# Patient Record
Sex: Male | Born: 1950
Health system: Southern US, Community
[De-identification: ages and names within clinical notes are randomized; demographics above are authoritative.]

## PROBLEM LIST (undated history)

## (undated) DIAGNOSIS — Z9119 Patient's noncompliance with other medical treatment and regimen: Secondary | ICD-10-CM

## (undated) DIAGNOSIS — D696 Thrombocytopenia, unspecified: Secondary | ICD-10-CM

## (undated) DIAGNOSIS — I471 Supraventricular tachycardia: Secondary | ICD-10-CM

## (undated) DIAGNOSIS — I4721 Torsades de pointes: Secondary | ICD-10-CM

## (undated) DIAGNOSIS — N183 Chronic kidney disease, stage 3 unspecified: Secondary | ICD-10-CM

## (undated) DIAGNOSIS — G4733 Obstructive sleep apnea (adult) (pediatric): Secondary | ICD-10-CM

## (undated) DIAGNOSIS — J449 Chronic obstructive pulmonary disease, unspecified: Secondary | ICD-10-CM

## (undated) DIAGNOSIS — I739 Peripheral vascular disease, unspecified: Secondary | ICD-10-CM

## (undated) DIAGNOSIS — C189 Malignant neoplasm of colon, unspecified: Secondary | ICD-10-CM

## (undated) DIAGNOSIS — Z91199 Patient's noncompliance with other medical treatment and regimen due to unspecified reason: Secondary | ICD-10-CM

## (undated) DIAGNOSIS — I472 Ventricular tachycardia: Secondary | ICD-10-CM

## (undated) DIAGNOSIS — I712 Thoracic aortic aneurysm, without rupture, unspecified: Secondary | ICD-10-CM

## (undated) DIAGNOSIS — I5042 Chronic combined systolic (congestive) and diastolic (congestive) heart failure: Secondary | ICD-10-CM

## (undated) DIAGNOSIS — I2510 Atherosclerotic heart disease of native coronary artery without angina pectoris: Secondary | ICD-10-CM

## (undated) DIAGNOSIS — E669 Obesity, unspecified: Secondary | ICD-10-CM

## (undated) DIAGNOSIS — Z8041 Family history of malignant neoplasm of ovary: Secondary | ICD-10-CM

## (undated) DIAGNOSIS — I10 Essential (primary) hypertension: Secondary | ICD-10-CM

## (undated) DIAGNOSIS — I4729 Other ventricular tachycardia: Secondary | ICD-10-CM

## (undated) DIAGNOSIS — I4719 Other supraventricular tachycardia: Secondary | ICD-10-CM

## (undated) DIAGNOSIS — Z933 Colostomy status: Secondary | ICD-10-CM

## (undated) DIAGNOSIS — Z9989 Dependence on other enabling machines and devices: Secondary | ICD-10-CM

## (undated) DIAGNOSIS — J45909 Unspecified asthma, uncomplicated: Secondary | ICD-10-CM

## (undated) DIAGNOSIS — E7800 Pure hypercholesterolemia, unspecified: Secondary | ICD-10-CM

## (undated) DIAGNOSIS — M1990 Unspecified osteoarthritis, unspecified site: Secondary | ICD-10-CM

## (undated) HISTORY — PX: CORONARY ANGIOPLASTY WITH STENT PLACEMENT: SHX49

## (undated) HISTORY — DX: Peripheral vascular disease, unspecified: I73.9

## (undated) HISTORY — DX: Other supraventricular tachycardia: I47.19

## (undated) HISTORY — DX: Family history of malignant neoplasm of ovary: Z80.41

## (undated) HISTORY — DX: Thoracic aortic aneurysm, without rupture, unspecified: I71.20

## (undated) HISTORY — DX: Malignant neoplasm of colon, unspecified: C18.9

## (undated) HISTORY — DX: Atherosclerotic heart disease of native coronary artery without angina pectoris: I25.10

## (undated) HISTORY — DX: Obesity, unspecified: E66.9

## (undated) HISTORY — DX: Patient's noncompliance with other medical treatment and regimen due to unspecified reason: Z91.199

## (undated) HISTORY — DX: Patient's noncompliance with other medical treatment and regimen: Z91.19

## (undated) HISTORY — DX: Ventricular tachycardia: I47.2

## (undated) HISTORY — DX: Other ventricular tachycardia: I47.29

## (undated) HISTORY — DX: Supraventricular tachycardia: I47.1

## (undated) HISTORY — PX: COLON SURGERY: SHX602

---

## 2000-04-07 ENCOUNTER — Inpatient Hospital Stay (HOSPITAL_COMMUNITY): Admission: EM | Admit: 2000-04-07 | Discharge: 2000-04-11 | Payer: Self-pay | Admitting: Emergency Medicine

## 2000-04-07 ENCOUNTER — Encounter: Payer: Self-pay | Admitting: Emergency Medicine

## 2000-04-08 ENCOUNTER — Encounter: Payer: Self-pay | Admitting: Family Medicine

## 2000-04-17 ENCOUNTER — Encounter: Admission: RE | Admit: 2000-04-17 | Discharge: 2000-04-17 | Payer: Self-pay | Admitting: Family Medicine

## 2008-11-05 ENCOUNTER — Ambulatory Visit: Payer: Self-pay | Admitting: Internal Medicine

## 2008-11-05 ENCOUNTER — Inpatient Hospital Stay (HOSPITAL_COMMUNITY): Admission: EM | Admit: 2008-11-05 | Discharge: 2008-11-13 | Payer: Self-pay | Admitting: Emergency Medicine

## 2008-11-06 ENCOUNTER — Encounter: Payer: Self-pay | Admitting: Internal Medicine

## 2008-11-07 ENCOUNTER — Encounter: Payer: Self-pay | Admitting: Internal Medicine

## 2008-11-07 ENCOUNTER — Ambulatory Visit: Payer: Self-pay | Admitting: Pulmonary Disease

## 2008-11-07 ENCOUNTER — Ambulatory Visit: Payer: Self-pay | Admitting: Surgery

## 2008-11-13 ENCOUNTER — Encounter: Payer: Self-pay | Admitting: Internal Medicine

## 2008-12-02 DIAGNOSIS — I10 Essential (primary) hypertension: Secondary | ICD-10-CM | POA: Insufficient documentation

## 2008-12-03 ENCOUNTER — Ambulatory Visit: Payer: Self-pay | Admitting: Internal Medicine

## 2008-12-03 DIAGNOSIS — I2589 Other forms of chronic ischemic heart disease: Secondary | ICD-10-CM | POA: Insufficient documentation

## 2008-12-03 DIAGNOSIS — I471 Supraventricular tachycardia: Secondary | ICD-10-CM | POA: Insufficient documentation

## 2008-12-03 DIAGNOSIS — E785 Hyperlipidemia, unspecified: Secondary | ICD-10-CM | POA: Insufficient documentation

## 2008-12-03 DIAGNOSIS — J45909 Unspecified asthma, uncomplicated: Secondary | ICD-10-CM | POA: Insufficient documentation

## 2008-12-03 DIAGNOSIS — J4489 Other specified chronic obstructive pulmonary disease: Secondary | ICD-10-CM | POA: Insufficient documentation

## 2008-12-03 DIAGNOSIS — I2510 Atherosclerotic heart disease of native coronary artery without angina pectoris: Secondary | ICD-10-CM | POA: Insufficient documentation

## 2008-12-03 DIAGNOSIS — E669 Obesity, unspecified: Secondary | ICD-10-CM

## 2008-12-03 DIAGNOSIS — J449 Chronic obstructive pulmonary disease, unspecified: Secondary | ICD-10-CM | POA: Insufficient documentation

## 2008-12-04 ENCOUNTER — Ambulatory Visit: Payer: Self-pay | Admitting: Pulmonary Disease

## 2008-12-04 DIAGNOSIS — G4733 Obstructive sleep apnea (adult) (pediatric): Secondary | ICD-10-CM | POA: Insufficient documentation

## 2008-12-04 LAB — CONVERTED CEMR LAB
ALT: 17 units/L (ref 0–53)
AST: 16 units/L (ref 0–37)
Albumin: 3.9 g/dL (ref 3.5–5.2)
Alkaline Phosphatase: 103 units/L (ref 39–117)
BUN: 15 mg/dL (ref 6–23)
Bilirubin, Direct: 0.1 mg/dL (ref 0.0–0.3)
CO2: 28 meq/L (ref 19–32)
Calcium: 9.6 mg/dL (ref 8.4–10.5)
Chloride: 103 meq/L (ref 96–112)
Creatinine, Ser: 1.3 mg/dL (ref 0.4–1.5)
GFR calc non Af Amer: 73.49 mL/min (ref >60)
Glucose, Bld: 92 mg/dL (ref 70–99)
Potassium: 4 meq/L (ref 3.5–5.1)
Pro B Natriuretic peptide (BNP): 89 pg/mL (ref 0.0–100.0)
Sodium: 143 meq/L (ref 135–145)
Total Bilirubin: 1 mg/dL (ref 0.3–1.2)
Total Protein: 7.5 g/dL (ref 6.0–8.3)

## 2009-01-12 ENCOUNTER — Ambulatory Visit: Payer: Self-pay | Admitting: Internal Medicine

## 2009-01-12 DIAGNOSIS — N529 Male erectile dysfunction, unspecified: Secondary | ICD-10-CM | POA: Insufficient documentation

## 2009-03-19 ENCOUNTER — Encounter: Payer: Self-pay | Admitting: Internal Medicine

## 2009-04-08 ENCOUNTER — Ambulatory Visit: Payer: Self-pay

## 2009-04-08 ENCOUNTER — Ambulatory Visit: Payer: Self-pay | Admitting: Cardiology

## 2009-04-08 ENCOUNTER — Ambulatory Visit (HOSPITAL_COMMUNITY): Admission: RE | Admit: 2009-04-08 | Discharge: 2009-04-08 | Payer: Self-pay | Admitting: Internal Medicine

## 2009-04-08 ENCOUNTER — Ambulatory Visit: Payer: Self-pay | Admitting: Internal Medicine

## 2009-04-08 ENCOUNTER — Encounter: Payer: Self-pay | Admitting: Internal Medicine

## 2009-11-26 ENCOUNTER — Ambulatory Visit: Payer: Self-pay | Admitting: Internal Medicine

## 2009-11-30 LAB — CONVERTED CEMR LAB
ALT: 19 units/L (ref 0–53)
AST: 19 units/L (ref 0–37)
Albumin: 4.1 g/dL (ref 3.5–5.2)
Alkaline Phosphatase: 81 units/L (ref 39–117)
BUN: 17 mg/dL (ref 6–23)
Bilirubin, Direct: 0.1 mg/dL (ref 0.0–0.3)
CO2: 26 meq/L (ref 19–32)
Calcium: 9.4 mg/dL (ref 8.4–10.5)
Chloride: 103 meq/L (ref 96–112)
Cholesterol: 148 mg/dL (ref 0–200)
Creatinine, Ser: 1.2 mg/dL (ref 0.4–1.5)
GFR calc non Af Amer: 78.06 mL/min (ref >60)
Glucose, Bld: 90 mg/dL (ref 70–99)
HDL: 48.8 mg/dL (ref >39.00)
Hgb A1c MFr Bld: 6 % (ref 4.6–6.5)
LDL Cholesterol: 71 mg/dL (ref 0–99)
Potassium: 4 meq/L (ref 3.5–5.1)
Pro B Natriuretic peptide (BNP): 30.9 pg/mL (ref 0.0–100.0)
Sodium: 140 meq/L (ref 135–145)
Total Bilirubin: 0.9 mg/dL (ref 0.3–1.2)
Total CHOL/HDL Ratio: 3
Total Protein: 7.1 g/dL (ref 6.0–8.3)
Triglycerides: 141 mg/dL (ref 0.0–149.0)
VLDL: 28.2 mg/dL (ref 0.0–40.0)

## 2010-03-25 NOTE — Miscellaneous (Signed)
Summary: Orders Update  Clinical Lists Changes  Orders: Added new Referral order of Internal Medicine Referral (Internal) - Signed

## 2010-03-25 NOTE — Assessment & Plan Note (Signed)
Summary: 6 MO F/U   Visit Type:  Follow-up Primary Provider:  none   History of Present Illness: The patient presents today for routine cardiology followup. He reports doing very well since last being seen in our clinic. The patient denies symptoms of palpitations, chest pain, shortness of breath, orthopnea, PND, lower extremity edema, dizziness, presyncope, syncope, or neurologic sequela.  He has had difficulty with diet modifcation and continues to gain weight. The patient is tolerating medications without difficulties and is otherwise without complaint today.  \ Current Medications (verified): 1)  Bufferin 325 Mg Tabs (Aspirin Buf(Cacarb-Mgcarb-Mgo)) .[name] Kitchen.. 1 By Mouth Daily 2)  Carvedilol 25 Mg Tabs (Carvedilol) .... One By Mouth Bid 3)  Plavix 75 Mg Tabs (Clopidogrel Bisulfate) .[name] Kitchen.. 1 By Mouth Daily 4)  Crestor 40 Mg Tabs (Rosuvastatin Calcium) .[name] Kitchen.. 1 By Mouth Daily 5)  Diltiazem Hcl Cr 240 Mg Xr24h-Cap (Diltiazem Hcl) .[name] Kitchen.. 1 By Mouth Daily 6)  Furosemide 40 Mg Tabs (Furosemide) .[name] Kitchen.. 1 By Mouth  Two Times A Day 7)  Lisinopril 20 Mg Tabs (Lisinopril) .... Take One Tablet By Mouth Once Daily. 8)  Nitrostat 0.4 Mg Subl (Nitroglycerin) .... As Needed Chest Pain 9)  Klor-Con M20 20 Meq Cr-Tabs (Potassium Chloride Crys Cr) .[name] Kitchen.. 1 By Mouth Daily 10)  Xopenex Hfa 45 Mcg/act Aero (Levalbuterol Tartrate) .... As Needed 11)  Atrovent Hfa 17 Mcg/act Aers (Ipratropium Bromide Hfa) .... As Needed 12)  Viagra 50 Mg Tabs (Sildenafil Citrate) .... Take As Directed  Allergies (verified): No Known Drug Allergies  Past History:  Past Medical History: Reviewed history from 12/03/2008 and no changes required. C O P D (ICD-496) ASTHMA (ICD-493.90) OBESITY (ICD-278.00) CARDIOMYOPATHY, ISCHEMIC (ICD-414.8) ATRIAL TACHYCARDIA (ICD-427.89) CAD (ICD-414.00) s/p PCI RCA 9/10 HYPERLIPIDEMIA (ICD-272.4) HYPERTENSION (ICD-401.9) H/o tobacco use  Past Surgical History: Reviewed history from 12/02/2008 and  no changes required.  None.   Social History: Reviewed history from 12/04/2008 and no changes required. 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   Vital Signs:  Patient profile:   60 year old male Height:      71 inches Weight:      316 pounds BMI:     44.23 Pulse rate:   77 / minute BP sitting:   128 / 86  (left arm)  Vitals Entered By: Laurance Flatten CMA (November 26, 2009 10:56 AM)  Physical Exam  General:  Obese, NAD Head:  normocephalic and atraumatic Eyes:  PERRLA/EOM intact; conjunctiva and lids normal. Mouth:  Teeth, gums and palate normal. Oral mucosa normal. Neck:  supple, JVP 9 Lungs:  Clear bilaterally to auscultation and percussion. Heart:  RRR with frequent ectopy, 1/6 SEM LUSB Abdomen:  Bowel sounds positive; abdomen soft and non-tender without masses, organomegaly, or hernias noted. No hepatosplenomegaly. Msk:  Back normal, normal gait. Muscle strength and tone normal. Pulses:  pulses normal in all 4 extremities Extremities:  No clubbing or cyanosis.  1+R>L LE edema Neurologic:  Alert and oriented x 3.   EKG  Procedure date:  11/26/2009  Findings:      sinus rhythmm 77 bpm, PR 198, QTc 450, otherwise normal ekg  Impression & Recommendations:  Problem # 1:  CARDIOMYOPATHY, ISCHEMIC (ICD-414.8) stable without symptoms of CHF. continue current medicine regimen salt restriction advised bmet and BNP today  Problem # 2:  CAD (ICD-414.00) no symptoms of ischemia continue  current medicine  Problem # 3:  HYPERLIPIDEMIA (ICD-272.4) fasting lipids and LFTs  Problem # 4:  OBESITY (ICD-278.00) weight reduction advised we will check A1C today  I will refer pt to primary care.  Problem # 5:  ATRIAL TACHYCARDIA (ICD-427.89) no further episodes continue coreg and cardizem  Other Orders: EKG w/ Interpretation (93000) TLB-BMP (Basic Metabolic  Panel-BMET) (80048-METABOL) TLB-Lipid Panel (80061-LIPID) TLB-Hepatic/Liver Function Pnl (80076-HEPATIC) TLB-BNP (B-Natriuretic Peptide) (83880-BNPR) TLB-A1C / Hgb A1C (Glycohemoglobin) (83036-A1C)  Patient Instructions: 1)  Your physician recommends that you return for lab work today 2)  Your physician wants you to follow-up in:6 months with Dr Johney Frame   Bonita Quin will receive a reminder letter in the mail two months in advance. If you don't receive a letter, please call our office to schedule the follow-up appointment. 3)  You have been referred to Dr Waynard Edwards with [facility]

## 2010-03-25 NOTE — Assessment & Plan Note (Signed)
Summary: per check out/also having echo @ 2:00/saf   Visit Type:  Follow-up Primary Provider:  none  CC:  no cardiac complaints today.  History of Present Illness: The patient presents today for routine electrophysiology followup. He reports doing very well since last being seen in our clinic. The patient denies symptoms of palpitations, chest pain, shortness of breath, orthopnea, PND, lower extremity edema, dizziness, presyncope, syncope, or neurologic sequela. The patient is tolerating medications without difficulties and is otherwise without complaint today.   Current Medications (verified): 1)  Bufferin 325 Mg Tabs (Aspirin Buf(Cacarb-Mgcarb-Mgo)) .[name] Kitchen.. 1 By Mouth Daily 2)  Carvedilol 25 Mg Tabs (Carvedilol) .... One By Mouth Bid 3)  Plavix 75 Mg Tabs (Clopidogrel Bisulfate) .[name] Kitchen.. 1 By Mouth Daily 4)  Crestor 40 Mg Tabs (Rosuvastatin Calcium) .[name] Kitchen.. 1 By Mouth Daily 5)  Diltiazem Hcl Cr 240 Mg Xr24h-Cap (Diltiazem Hcl) .[name] Kitchen.. 1 By Mouth Daily 6)  Furosemide 40 Mg Tabs (Furosemide) .[name] Kitchen.. 1 By Mouth  Two Times A Day 7)  Lisinopril 20 Mg Tabs (Lisinopril) .... Take One Tablet By Mouth Once Daily. 8)  Nitrostat 0.4 Mg Subl (Nitroglycerin) .... As Needed Chest Pain 9)  Klor-Con M20 20 Meq Cr-Tabs (Potassium Chloride Crys Cr) .[name] Kitchen.. 1 By Mouth Daily 10)  Xopenex Hfa 45 Mcg/act Aero (Levalbuterol Tartrate) .... As Needed 11)  Atrovent Hfa 17 Mcg/act Aers (Ipratropium Bromide Hfa) .... As Needed 12)  Viagra 50 Mg Tabs (Sildenafil Citrate) .... Take As Directed  Allergies (verified): No Known Drug Allergies  Past History:  Past Medical History: Reviewed history from 12/03/2008 and no changes required. C O P D (ICD-496) ASTHMA (ICD-493.90) OBESITY (ICD-278.00) CARDIOMYOPATHY, ISCHEMIC (ICD-414.8) ATRIAL TACHYCARDIA (ICD-427.89) CAD (ICD-414.00) s/p PCI RCA 9/10 HYPERLIPIDEMIA (ICD-272.4) HYPERTENSION (ICD-401.9) H/o tobacco use  Past Surgical History: Reviewed history from 12/02/2008 and  no changes required.  None.   Social History: Reviewed history from 12/04/2008 and no changes required. 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 Nose:  no deformity, discharge, inflammation, or lesions Mouth:  Teeth, gums and palate normal. Oral mucosa normal. Neck:  supple, JVP 9 Lungs:  Clear bilaterally to auscultation and percussion. Heart:  RRR with frequent ectopy, 1/6 SEM LUSB Abdomen:  Bowel sounds positive; abdomen soft and non-tender without masses, organomegaly, or hernias noted. No hepatosplenomegaly. Msk:  Back normal, normal gait. Muscle strength and tone normal. Pulses:  pulses normal in all 4 extremities Extremities:  No clubbing or cyanosis.  1+R>L LE edema Neurologic:  Alert and oriented x 3. Skin:  Intact without lesions or rashes. Cervical Nodes:  no significant adenopathy Psych:  Normal affect.   EKG  Procedure date:  04/08/2009  Findings:      sinus with PACs,  80 bpm, nonspecific St/T changes  Impression & Recommendations:  Problem # 1:  CARDIOMYOPATHY, ISCHEMIC (ICD-414.8) stable without symptoms of CHF or ischemia. Continue current medical therapy salt restriction  His updated medication list for this problem includes:  Bufferin 325 Mg Tabs (Aspirin buf(cacarb-mgcarb-mgo)) .[name] Kitchen... 1 by mouth daily    Carvedilol 25 Mg Tabs (Carvedilol) ..... One by mouth bid    Plavix 75 Mg Tabs (Clopidogrel bisulfate) .[name] Kitchen... 1 by mouth daily    Diltiazem Hcl Cr 240 Mg Xr24h-cap (Diltiazem hcl) .[name] Kitchen... 1 by mouth daily    Furosemide 40 Mg  Tabs (Furosemide) .[name] Kitchen... 1 by mouth  two times a day    Lisinopril 20 Mg Tabs (Lisinopril) .[name] Kitchen... Take one tablet by mouth once daily.    Nitrostat 0.4 Mg Subl (Nitroglycerin) .[name] Kitchen... As needed chest pain  Problem # 2:  ATRIAL TACHYCARDIA (ICD-427.89) no further symptomatic episodes continue coreg  Problem # 3:  HYPERTENSION (ICD-401.9) stable  His updated medication list for this problem includes:    Bufferin 325 Mg Tabs (Aspirin buf(cacarb-mgcarb-mgo)) .[name] Kitchen... 1 by mouth daily    Carvedilol 25 Mg Tabs (Carvedilol) ..... One by mouth bid    Diltiazem Hcl Cr 240 Mg Xr24h-cap (Diltiazem hcl) .[name] Kitchen... 1 by mouth daily    Furosemide 40 Mg Tabs (Furosemide) .[name] Kitchen... 1 by mouth  two times a day    Lisinopril 20 Mg Tabs (Lisinopril) .[name] Kitchen... Take one tablet by mouth once daily.  Problem # 4:  HYPERLIPIDEMIA (ICD-272.4) stable  His updated medication list for this problem includes:    Crestor 40 Mg Tabs (Rosuvastatin calcium) .[name] Kitchen... 1 by mouth daily  CHF Assessment/Plan:      The patient's current weight is 311 pounds.  His previous weight was 304 pounds.     Patient Instructions: 1)  return in 6 months

## 2010-03-25 NOTE — Miscellaneous (Signed)
Summary: [facility] Progress Note  [facility] Progress Note   Imported By: Roderic Ovens 03/25/2009 14:53:25  _____________________________________________________________________  External Attachment:    Type:   Image     Comment:   External Document

## 2010-05-28 LAB — COMPREHENSIVE METABOLIC PANEL
AST: 19 U/L (ref 0–37)
AST: 19 U/L (ref 0–37)
Albumin: 3.4 g/dL — ABNORMAL LOW (ref 3.5–5.2)
Albumin: 4 g/dL (ref 3.5–5.2)
Alkaline Phosphatase: 86 U/L (ref 39–117)
BUN: 15 mg/dL (ref 6–23)
Calcium: 9.5 mg/dL (ref 8.4–10.5)
Creatinine, Ser: 1.03 mg/dL (ref 0.4–1.5)
GFR calc Af Amer: >60 mL/min (ref >60)
GFR calc Af Amer: >60 mL/min (ref >60)
Potassium: 3.7 mEq/L (ref 3.5–5.1)
Sodium: 138 mEq/L (ref 135–145)
Total Protein: 6.7 g/dL (ref 6.0–8.3)
Total Protein: 7.5 g/dL (ref 6.0–8.3)

## 2010-05-28 LAB — POCT I-STAT 3, VENOUS BLOOD GAS (G3P V)
Acid-Base Excess: 2 mmol/L (ref 0.0–2.0)
O2 Saturation: 72 %

## 2010-05-28 LAB — URINALYSIS, ROUTINE W REFLEX MICROSCOPIC
Bilirubin Urine: NEGATIVE
Ketones, ur: NEGATIVE mg/dL
Nitrite: NEGATIVE
pH: 6 (ref 5.0–8.0)

## 2010-05-28 LAB — BASIC METABOLIC PANEL
BUN: 11 mg/dL (ref 6–23)
BUN: 12 mg/dL (ref 6–23)
BUN: 15 mg/dL (ref 6–23)
CO2: 24 mEq/L (ref 19–32)
CO2: 26 mEq/L (ref 19–32)
CO2: 27 mEq/L (ref 19–32)
CO2: 28 mEq/L (ref 19–32)
CO2: 28 mEq/L (ref 19–32)
Calcium: 9.3 mg/dL (ref 8.4–10.5)
Calcium: 9.6 mg/dL (ref 8.4–10.5)
Chloride: 101 mEq/L (ref 96–112)
Chloride: 102 mEq/L (ref 96–112)
Chloride: 103 mEq/L (ref 96–112)
Chloride: 107 mEq/L (ref 96–112)
Creatinine, Ser: 1.15 mg/dL (ref 0.4–1.5)
Creatinine, Ser: 1.16 mg/dL (ref 0.4–1.5)
Creatinine, Ser: 1.16 mg/dL (ref 0.4–1.5)
GFR calc Af Amer: >60 mL/min (ref >60)
GFR calc Af Amer: >60 mL/min (ref >60)
GFR calc Af Amer: >60 mL/min (ref >60)
GFR calc non Af Amer: 59 mL/min — ABNORMAL LOW (ref >60)
GFR calc non Af Amer: >60 mL/min (ref >60)
Glucose, Bld: 101 mg/dL — ABNORMAL HIGH (ref 70–99)
Glucose, Bld: 87 mg/dL (ref 70–99)
Glucose, Bld: 94 mg/dL (ref 70–99)
Glucose, Bld: 98 mg/dL (ref 70–99)
Potassium: 3.2 mEq/L — ABNORMAL LOW (ref 3.5–5.1)
Potassium: 3.3 mEq/L — ABNORMAL LOW (ref 3.5–5.1)
Potassium: 3.6 mEq/L (ref 3.5–5.1)
Potassium: 3.9 mEq/L (ref 3.5–5.1)
Sodium: 136 mEq/L (ref 135–145)
Sodium: 139 mEq/L (ref 135–145)
Sodium: 142 mEq/L (ref 135–145)

## 2010-05-28 LAB — PROTIME-INR: INR: 1 (ref 0.00–1.49)

## 2010-05-28 LAB — DIFFERENTIAL
Eosinophils Relative: 1 % (ref 0–5)
Lymphocytes Relative: 20 % (ref 12–46)
Lymphs Abs: 1.6 10*3/uL (ref 0.7–4.0)
Monocytes Absolute: 0.6 10*3/uL (ref 0.1–1.0)
Monocytes Relative: 7 % (ref 3–12)

## 2010-05-28 LAB — CBC
HCT: 39.9 % (ref 39.0–52.0)
HCT: 40.5 % (ref 39.0–52.0)
Hemoglobin: 12.9 g/dL — ABNORMAL LOW (ref 13.0–17.0)
Hemoglobin: 13.2 g/dL (ref 13.0–17.0)
Hemoglobin: 13.4 g/dL (ref 13.0–17.0)
MCHC: 32.9 g/dL (ref 30.0–36.0)
MCHC: 33 g/dL (ref 30.0–36.0)
MCHC: 33.3 g/dL (ref 30.0–36.0)
MCV: 88.4 fL (ref 78.0–100.0)
MCV: 88.4 fL (ref 78.0–100.0)
MCV: 88.6 fL (ref 78.0–100.0)
Platelets: 142 10*3/uL — ABNORMAL LOW (ref 150–400)
Platelets: 158 10*3/uL (ref 150–400)
RBC: 4.43 MIL/uL (ref 4.22–5.81)
RBC: 4.58 MIL/uL (ref 4.22–5.81)
RDW: 14.8 % (ref 11.5–15.5)
RDW: 15.2 % (ref 11.5–15.5)
RDW: 15.5 % (ref 11.5–15.5)
WBC: 4.5 10*3/uL (ref 4.0–10.5)
WBC: 7.9 10*3/uL (ref 4.0–10.5)

## 2010-05-28 LAB — CARDIAC PANEL(CRET KIN+CKTOT+MB+TROPI)
Relative Index: 1 (ref 0.0–2.5)
Total CK: 216 U/L (ref 7–232)
Total CK: 232 U/L (ref 7–232)
Troponin I: 0.03 ng/mL (ref 0.00–0.06)

## 2010-05-28 LAB — POCT CARDIAC MARKERS
CKMB, poc: 4.1 ng/mL (ref 1.0–8.0)
Troponin i, poc: <0.05 ng/mL (ref 0.00–0.09)

## 2010-05-28 LAB — POCT I-STAT 3, ART BLOOD GAS (G3+)
Acid-Base Excess: 4 mmol/L — ABNORMAL HIGH (ref 0.0–2.0)
Bicarbonate: 29 mEq/L — ABNORMAL HIGH (ref 20.0–24.0)
O2 Saturation: 97 %
pO2, Arterial: 94 mmHg (ref 80.0–100.0)

## 2010-05-28 LAB — LIPID PANEL
Cholesterol: 226 mg/dL — ABNORMAL HIGH (ref 0–200)
LDL Cholesterol: 157 mg/dL — ABNORMAL HIGH (ref 0–99)
Total CHOL/HDL Ratio: 5.1 RATIO

## 2010-05-28 LAB — LIPASE, BLOOD: Lipase: 15 U/L (ref 11–59)

## 2010-05-28 LAB — URINE CULTURE: Colony Count: NO GROWTH

## 2010-05-28 LAB — CK TOTAL AND CKMB (NOT AT ARMC)
CK, MB: 3.7 ng/mL (ref 0.3–4.0)
Total CK: 283 U/L — ABNORMAL HIGH (ref 7–232)

## 2010-05-28 LAB — BRAIN NATRIURETIC PEPTIDE: Pro B Natriuretic peptide (BNP): 472 pg/mL — ABNORMAL HIGH (ref 0.0–100.0)

## 2010-05-28 LAB — APTT: aPTT: 29 seconds (ref 24–37)

## 2010-06-01 ENCOUNTER — Other Ambulatory Visit: Payer: Self-pay | Admitting: Internal Medicine

## 2010-07-06 ENCOUNTER — Other Ambulatory Visit: Payer: Self-pay | Admitting: Internal Medicine

## 2010-07-09 NOTE — Discharge Summary (Signed)
[place]  Patient:    Larry [name], Larry [name]                  MRN: [id number] Adm. Date:  [id number] Disc. Date: [id number] Attending:  Doneta Public Dictator:   Andrey Spearman, M.D.                           Discharge Summary  DISCHARGE DIAGNOSES: 1. Possible pneumonia. 2. Asthma exacerbation. 3. Possible chronic obstructive pulmonary disease. 4. Tobacco abuse. 5. Hypertension. 6. Obesity.  DISCHARGE MEDICATIONS: 1. Flovent 110 mcg 2 puffs b.i.d. 2. Albuterol 2.5 mg 2 puffs q.4h. p.r.n. 3. Nicotine patch q.d. 4. Prednisone taper; 80 mg 1st day, then 60 mg, then 40 mg, then 20 mg, then    off. 5. HCTZ 25 mg q.d.  BRIEF ADMISSION HISTORY OF PRESENT ILLNESS: This patient is a 60 year old African-American man who was admitted with increasing shortness of breath x 1 day.  His wife had been giving him albuterol neb q.3h. for one day without much relief.  He has a history of wheezing and asthma as a child with frequent hospitalizations but he has never been intubated.  Over the past year, he has noticed increase wheezing with response to albuterol MDI which he uses 1-2 times per week.  He also came in with recent fevers, chills, cough productive of clear white sputum.  PHYSICAL EXAMINATION:  VITAL SIGNS:  Significant for a temperature of 103.0, pulse 126, blood pressure 140/90, saturating 90% on room air.  GENERAL:  He was in moderate respiratory distress and unable to complete sentences and was sweating.  HEART:  Significant only for tachycardia.  LUNG:  Diffuse expiratory wheezing.  He was tachypneic.  He was unable to complete full sentences and he had increased work of breathing.  Decreased breath sounds in the left lower lobe.  LABORATORY AND ACCESSORY DATA:  Significant for an H&H which was within normal limits, normal white count.  Chest x-ray showed bronchitis and scoliosis, and he was admitted for pneumonia and asthma  exacerbation.  HOSPITAL COURSE: #1 -  PNEUMONIA:  Despite a negative chest x-ray, patient came in with fevers and decreased breath sounds in his left lower lobe.  There was never a definite pneumonia on chest x-ray but he was treated with a five day course of azithromycin and Rocephin.  On discharge, he was afebrile.  Of note, his blood cultures and sputum cultures were negative throughout the admission.  #2 - ASTHMA EXACERBATION WITH POSSIBLE CONCURRENT COPD:  With patients long history of COPD, we are unclear as to whether he has merely just asthma or also underlying COPD.  He was started on albuterol and Atrovent nebs, started on Solu-Medrol 80 mg q.6h. and given oxygen by nasal cannula as needed.  On hospital day #2, patient was feeling somewhat better, nebulizers were spaced some, he was changed to 125 mg of Solu-Medrol a day, continued on nebulizers again until April 11, 1999, at that time he was started on inhalers, changed to oral steroids at 80 a day, and patient continued to improve.  On discharge, he was ambulating in setting 97% on room air while ambulating.  He still had expiratory wheezes and some rhonchi on his exam, however, given his satisfactory O2 saturations, we thought he would be stable for discharge home. He will be going home with the medications as outlined above.  He was instructed that the Flovent  inhaler he should use every day, and the albuterol inhaler only as needed, although probably over the next couple of days he will need the albuterol inhaler more.  He will do the prednisone taper as described above and will be following up in my clinic on April 17, 2000.  #3 - TOBACCO ABUSE:  Discussed smoking cessation with the patient in the hospital and started him on the nicotine patch.  He expressed interest in continued smoking cessation so he was discharged home with a nicotine patch.  #4 - HYPERTENSION:  Patient was unclear what blood pressure medicine  he was on when he came in, however, we started him on HCTZ in the hospital.  His blood pressures trended on the high side throughout his hospital admission.  This will need to be addressed as an outpatient. DD:  04/11/00 TD:  04/12/00 Job: [id number]

## 2010-08-24 ENCOUNTER — Other Ambulatory Visit: Payer: Self-pay | Admitting: Internal Medicine

## 2010-09-08 ENCOUNTER — Other Ambulatory Visit: Payer: Self-pay | Admitting: Nurse Practitioner

## 2010-10-04 ENCOUNTER — Other Ambulatory Visit: Payer: Self-pay | Admitting: Nurse Practitioner

## 2010-10-06 ENCOUNTER — Other Ambulatory Visit: Payer: Self-pay | Admitting: Nurse Practitioner

## 2010-11-03 ENCOUNTER — Other Ambulatory Visit: Payer: Self-pay | Admitting: Internal Medicine

## 2011-04-11 ENCOUNTER — Other Ambulatory Visit: Payer: Self-pay | Admitting: *Deleted

## 2011-06-16 ENCOUNTER — Emergency Department (HOSPITAL_COMMUNITY): Payer: Federal, State, Local not specified - PPO

## 2011-06-16 ENCOUNTER — Other Ambulatory Visit: Payer: Self-pay

## 2011-06-16 ENCOUNTER — Encounter (HOSPITAL_COMMUNITY): Payer: Self-pay | Admitting: *Deleted

## 2011-06-16 ENCOUNTER — Inpatient Hospital Stay (HOSPITAL_COMMUNITY)
Admission: EM | Admit: 2011-06-16 | Discharge: 2011-06-19 | DRG: 138 | Disposition: A | Discharge status: Home / Self Care | Payer: Federal, State, Local not specified - PPO | Attending: Internal Medicine | Admitting: Internal Medicine

## 2011-06-16 DIAGNOSIS — J45909 Unspecified asthma, uncomplicated: Secondary | ICD-10-CM

## 2011-06-16 DIAGNOSIS — I129 Hypertensive chronic kidney disease with stage 1 through stage 4 chronic kidney disease, or unspecified chronic kidney disease: Secondary | ICD-10-CM | POA: Diagnosis present

## 2011-06-16 DIAGNOSIS — Z6841 Body Mass Index (BMI) 40.0 and over, adult: Secondary | ICD-10-CM

## 2011-06-16 DIAGNOSIS — R609 Edema, unspecified: Secondary | ICD-10-CM

## 2011-06-16 DIAGNOSIS — N189 Chronic kidney disease, unspecified: Secondary | ICD-10-CM | POA: Diagnosis present

## 2011-06-16 DIAGNOSIS — Z7902 Long term (current) use of antithrombotics/antiplatelets: Secondary | ICD-10-CM

## 2011-06-16 DIAGNOSIS — I2589 Other forms of chronic ischemic heart disease: Secondary | ICD-10-CM | POA: Diagnosis present

## 2011-06-16 DIAGNOSIS — I498 Other specified cardiac arrhythmias: Principal | ICD-10-CM | POA: Diagnosis present

## 2011-06-16 DIAGNOSIS — Z9119 Patient's noncompliance with other medical treatment and regimen: Secondary | ICD-10-CM

## 2011-06-16 DIAGNOSIS — J4489 Other specified chronic obstructive pulmonary disease: Secondary | ICD-10-CM | POA: Diagnosis present

## 2011-06-16 DIAGNOSIS — E8779 Other fluid overload: Secondary | ICD-10-CM | POA: Diagnosis present

## 2011-06-16 DIAGNOSIS — I2510 Atherosclerotic heart disease of native coronary artery without angina pectoris: Secondary | ICD-10-CM | POA: Diagnosis present

## 2011-06-16 DIAGNOSIS — G4730 Sleep apnea, unspecified: Secondary | ICD-10-CM

## 2011-06-16 DIAGNOSIS — E669 Obesity, unspecified: Secondary | ICD-10-CM | POA: Diagnosis present

## 2011-06-16 DIAGNOSIS — E7800 Pure hypercholesterolemia, unspecified: Secondary | ICD-10-CM | POA: Diagnosis present

## 2011-06-16 DIAGNOSIS — R000 Tachycardia, unspecified: Secondary | ICD-10-CM | POA: Diagnosis present

## 2011-06-16 DIAGNOSIS — Z7982 Long term (current) use of aspirin: Secondary | ICD-10-CM

## 2011-06-16 DIAGNOSIS — Z91199 Patient's noncompliance with other medical treatment and regimen due to unspecified reason: Secondary | ICD-10-CM

## 2011-06-16 DIAGNOSIS — I10 Essential (primary) hypertension: Secondary | ICD-10-CM | POA: Diagnosis present

## 2011-06-16 DIAGNOSIS — L97809 Non-pressure chronic ulcer of other part of unspecified lower leg with unspecified severity: Secondary | ICD-10-CM | POA: Diagnosis present

## 2011-06-16 DIAGNOSIS — J449 Chronic obstructive pulmonary disease, unspecified: Secondary | ICD-10-CM | POA: Diagnosis present

## 2011-06-16 HISTORY — DX: Essential (primary) hypertension: I10

## 2011-06-16 HISTORY — DX: Pure hypercholesterolemia, unspecified: E78.00

## 2011-06-16 HISTORY — DX: Atherosclerotic heart disease of native coronary artery without angina pectoris: I25.10

## 2011-06-16 HISTORY — DX: Chronic obstructive pulmonary disease, unspecified: J44.9

## 2011-06-16 LAB — DIFFERENTIAL
Eosinophils Absolute: 0.1 10*3/uL (ref 0.0–0.7)
Eosinophils Relative: 2 % (ref 0–5)
Lymphs Abs: 1.3 10*3/uL (ref 0.7–4.0)
Monocytes Relative: 9 % (ref 3–12)

## 2011-06-16 LAB — CBC
MCH: 27.1 pg (ref 26.0–34.0)
MCV: 84.3 fL (ref 78.0–100.0)
Platelets: 229 10*3/uL (ref 150–400)
RBC: 4.83 MIL/uL (ref 4.22–5.81)

## 2011-06-16 LAB — COMPREHENSIVE METABOLIC PANEL
ALT: 10 U/L (ref 0–53)
AST: 12 U/L (ref 0–37)
Calcium: 9.5 mg/dL (ref 8.4–10.5)
Sodium: 137 mEq/L (ref 135–145)
Total Protein: 7.4 g/dL (ref 6.0–8.3)

## 2011-06-16 LAB — PRO B NATRIURETIC PEPTIDE: Pro B Natriuretic peptide (BNP): 60.2 pg/mL (ref 0–125)

## 2011-06-16 LAB — URINALYSIS, ROUTINE W REFLEX MICROSCOPIC
Glucose, UA: NEGATIVE mg/dL
Hgb urine dipstick: NEGATIVE
Ketones, ur: NEGATIVE mg/dL
Protein, ur: NEGATIVE mg/dL

## 2011-06-16 LAB — CARDIAC PANEL(CRET KIN+CKTOT+MB+TROPI)
CK, MB: 2.9 ng/mL (ref 0.3–4.0)
Relative Index: 1.5 (ref 0.0–2.5)
Troponin I: <0.3 ng/mL (ref <0.30)

## 2011-06-16 MED ORDER — ASPIRIN 325 MG PO TABS
325.0000 mg | ORAL_TABLET | Freq: Every day | ORAL | Status: DC
  Administered 2011-06-16 – 2011-06-19 (×4): 325 mg via ORAL
  Filled 2011-06-16 (×4): qty 1

## 2011-06-16 MED ORDER — ONDANSETRON HCL 4 MG PO TABS: 4.0000 mg | ORAL_TABLET | Freq: Four times a day (QID) | ORAL | Status: DC | PRN

## 2011-06-16 MED ORDER — DILTIAZEM HCL ER COATED BEADS 240 MG PO CP24
240.0000 mg | ORAL_CAPSULE | Freq: Once | ORAL | Status: AC
  Administered 2011-06-16: 240 mg via ORAL
  Filled 2011-06-16: qty 1

## 2011-06-16 MED ORDER — CLOPIDOGREL BISULFATE 75 MG PO TABS
75.0000 mg | ORAL_TABLET | Freq: Every day | ORAL | Status: DC
  Administered 2011-06-17 – 2011-06-19 (×3): 75 mg via ORAL
  Filled 2011-06-16 (×6): qty 1

## 2011-06-16 MED ORDER — CARVEDILOL 25 MG PO TABS
25.0000 mg | ORAL_TABLET | Freq: Two times a day (BID) | ORAL | Status: DC
  Administered 2011-06-16 – 2011-06-19 (×6): 25 mg via ORAL
  Filled 2011-06-16 (×8): qty 1

## 2011-06-16 MED ORDER — ACETAMINOPHEN 325 MG PO TABS: 650.0000 mg | ORAL_TABLET | Freq: Four times a day (QID) | ORAL | Status: DC | PRN

## 2011-06-16 MED ORDER — ATORVASTATIN CALCIUM 80 MG PO TABS
80.0000 mg | ORAL_TABLET | Freq: Every day | ORAL | Status: DC
  Administered 2011-06-16 – 2011-06-18 (×3): 80 mg via ORAL
  Filled 2011-06-16 (×4): qty 1

## 2011-06-16 MED ORDER — ENOXAPARIN SODIUM 40 MG/0.4ML ~~LOC~~ SOLN
40.0000 mg | SUBCUTANEOUS | Status: DC
  Administered 2011-06-16 – 2011-06-18 (×3): 40 mg via SUBCUTANEOUS
  Filled 2011-06-16 (×4): qty 0.4

## 2011-06-16 MED ORDER — DILTIAZEM HCL ER COATED BEADS 240 MG PO CP24
240.0000 mg | ORAL_CAPSULE | Freq: Every day | ORAL | Status: DC
  Administered 2011-06-17 – 2011-06-19 (×3): 240 mg via ORAL
  Filled 2011-06-16 (×3): qty 1

## 2011-06-16 MED ORDER — FUROSEMIDE 10 MG/ML IJ SOLN
40.0000 mg | Freq: Two times a day (BID) | INTRAMUSCULAR | Status: DC
  Administered 2011-06-16: 40 mg via INTRAVENOUS
  Filled 2011-06-16 (×3): qty 4

## 2011-06-16 MED ORDER — METOPROLOL TARTRATE 1 MG/ML IV SOLN: 5.0000 mg | Freq: Four times a day (QID) | INTRAVENOUS | Status: DC | PRN

## 2011-06-16 MED ORDER — LISINOPRIL 20 MG PO TABS
20.0000 mg | ORAL_TABLET | Freq: Every day | ORAL | Status: DC
  Administered 2011-06-17 – 2011-06-19 (×3): 20 mg via ORAL
  Filled 2011-06-16 (×3): qty 1

## 2011-06-16 MED ORDER — ACETAMINOPHEN 650 MG RE SUPP: 650.0000 mg | Freq: Four times a day (QID) | RECTAL | Status: DC | PRN

## 2011-06-16 MED ORDER — PNEUMOCOCCAL VAC POLYVALENT 25 MCG/0.5ML IJ INJ
0.5000 mL | INJECTION | INTRAMUSCULAR | Status: DC
  Filled 2011-06-16: qty 0.5

## 2011-06-16 MED ORDER — ONDANSETRON HCL 4 MG/2ML IJ SOLN: 4.0000 mg | Freq: Four times a day (QID) | INTRAMUSCULAR | Status: DC | PRN

## 2011-06-16 MED ORDER — FUROSEMIDE 10 MG/ML IJ SOLN
40.0000 mg | Freq: Once | INTRAMUSCULAR | Status: AC
  Administered 2011-06-16: 40 mg via INTRAVENOUS
  Filled 2011-06-16 (×2): qty 4

## 2011-06-16 MED ORDER — ALBUTEROL SULFATE (5 MG/ML) 0.5% IN NEBU: 2.5000 mg | INHALATION_SOLUTION | RESPIRATORY_TRACT | Status: DC | PRN

## 2011-06-16 MED ORDER — SODIUM CHLORIDE 0.9 % IJ SOLN
3.0000 mL | Freq: Two times a day (BID) | INTRAMUSCULAR | Status: DC
  Administered 2011-06-16 – 2011-06-19 (×6): 3 mL via INTRAVENOUS

## 2011-06-16 MED ORDER — CARVEDILOL 25 MG PO TABS
25.0000 mg | ORAL_TABLET | Freq: Once | ORAL | Status: AC
  Administered 2011-06-16: 25 mg via ORAL
  Filled 2011-06-16: qty 1

## 2011-06-16 MED ORDER — MORPHINE SULFATE 2 MG/ML IJ SOLN: 1.0000 mg | INTRAMUSCULAR | Status: DC | PRN

## 2011-06-16 NOTE — ED Notes (Signed)
rn called pharmacy and meds being sent up.

## 2011-06-16 NOTE — H&P (Signed)
Larry [name] is an 61 y.o. male.    PCP: Does not have a PCP. Used to follow with Dr. Johney Frame ([facility])  Chief Complaint: Leg swelling  HPI: This is a 61 year old, African American male, with a past medical history of coronary artery disease, ischemic cardiomyopathy, hypertension who has been noncompliant with his medications for the last the 2 months or so. He tells me that he ran out of his medications and never bothered to get them refilled. And, then about 2-3 weeks ago, he started noticing that his legs were swelling up. This got progressively worse. Denies any pain per se, but has been having some discomfort, especially with ambulation. He's been sedantary over the last few weeks. He's also noticed some open wounds in both his legs, which have been draining clear liquid. He has shortness of breath at times. He requires only one pillow to sleep. Denies any symptoms suggestive of PND. Denies any chest pains. No nausea, vomiting, fever or chills. Denies any sick contacts. In the emergency room minimal exertion will cause his heart rate to go into SVT, which appears to be regular. He has received carvedilol in the ED, and hopefully his heart rate will be better controlled. With these symptoms he denies any form of chest pain.   Home Medications: Prior to Admission medications   Medication Sig Start Date End Date Taking? Authorizing Provider  aspirin 325 MG tablet Take 325 mg by mouth daily.   Yes Historical Provider, MD  carvedilol (COREG) 25 MG tablet Take 25 mg by mouth 2 (two) times daily with a meal.   Yes Historical Provider, MD  clopidogrel (PLAVIX) 75 MG tablet Take 75 mg by mouth daily.   Yes Historical Provider, MD  diltiazem (CARDIZEM CD) 240 MG 24 hr capsule Take 240 mg by mouth daily.   Yes Historical Provider, MD  furosemide (LASIX) 40 MG tablet Take 40 mg by mouth 2 (two) times daily.   Yes Historical Provider, MD  lisinopril (PRINIVIL,ZESTRIL) 20 MG tablet Take 20 mg by  mouth daily.   Yes Historical Provider, MD  nitroGLYCERIN (NITROSTAT) 0.4 MG SL tablet Place 0.4 mg under the tongue every 5 (five) minutes as needed.   Yes Historical Provider, MD  potassium chloride SA (K-DUR,KLOR-CON) 20 MEQ tablet Take 20 mEq by mouth daily.   Yes Historical Provider, MD  rosuvastatin (CRESTOR) 40 MG tablet Take 40 mg by mouth daily.   Yes Historical Provider, MD    Allergies: No Known Allergies  Past Medical History: Past Medical History  Diagnosis Date  . Hypertension   . Hypercholesteremia   . CHF (congestive heart failure)   . Coronary artery disease   . COPD (chronic obstructive pulmonary disease)   . Asthma     Past Surgical History  Procedure Date  . Coronary stent placement     x2  . Cardiac catheterization 11/12/2008    stent x 2 to RCA    Social History:  reports that he quit smoking about 2 years ago. His smoking use included Cigarettes. He has never used smokeless tobacco. He reports that he does not drink alcohol or use illicit drugs.  Family History:  Family History  Problem Relation Age of Onset  . Hypertension Father   . Heart disease Father   . Diabetes Father   . Hypertension Mother     Review of Systems - History obtained from the patient General ROS: positive for  - fatigue Psychological ROS: negative Ophthalmic ROS:  negative ENT ROS: negative Allergy and Immunology ROS: negative Hematological and Lymphatic ROS: negative Endocrine ROS: negative Respiratory ROS: as in hpi Cardiovascular ROS: as in hpi Gastrointestinal ROS: negative Genito-Urinary ROS: negative Musculoskeletal ROS: negative Neurological ROS: negative Dermatological ROS: negative  Physical Examination Blood pressure 115/74, pulse 100, temperature 98.2 F (36.8 C), temperature source Oral, resp. rate 18, height 5\' 10"  (1.778 m), weight 156.99 kg (346 lb 1.6 oz), SpO2 97.00%.  General appearance: alert, cooperative, appears stated age and no distress Head:  Normocephalic, without obvious abnormality, atraumatic Eyes: conjunctivae/corneas clear. PERRL, EOM's intact.  Throat: lips, mucosa, and tongue normal; teeth and gums normal Neck: no adenopathy, no carotid bruit, no JVD, supple, symmetrical, trachea midline and thyroid not enlarged, symmetric, no tenderness/mass/nodules Back: symmetric, no curvature. ROM normal. No CVA tenderness. Resp: clear to auscultation bilaterally with decreased air entry at bases. Cardio: regular rate and rhythm, S1, S2 normal, no murmur, click, rub or gallop GI: soft, non-tender; bowel sounds normal; no masses,  no organomegaly Extremities: 4+ edema b/l LE Pulses: unable to palpate due to swelling Skin: 1 open area in left leg and 1 open area in right leg, shallow ulcers, draining clear serous liquid,  Lymph nodes: Cervical, supraclavicular, and axillary nodes normal. Neurologic: Grossly normal  Laboratory Data: Results for orders placed during the hospital encounter of 06/16/11 (from the past 48 hour(s))  CBC     Status: Normal   Collection Time   06/16/11 10:26 AM      Component Value Range Comment   WBC 6.7  4.0 - 10.5 (K/uL)    RBC 4.83  4.22 - 5.81 (MIL/uL)    Hemoglobin 13.1  13.0 - 17.0 (g/dL)    HCT 27.2  53.6 - 64.4 (%)    MCV 84.3  78.0 - 100.0 (fL)    MCH 27.1  26.0 - 34.0 (pg)    MCHC 32.2  30.0 - 36.0 (g/dL)    RDW 03.4  74.2 - 59.5 (%)    Platelets 229  150 - 400 (K/uL)   DIFFERENTIAL     Status: Normal   Collection Time   06/16/11 10:26 AM      Component Value Range Comment   Neutrophils Relative 69  43 - 77 (%)    Neutro Abs 4.6  1.7 - 7.7 (K/uL)    Lymphocytes Relative 20  12 - 46 (%)    Lymphs Abs 1.3  0.7 - 4.0 (K/uL)    Monocytes Relative 9  3 - 12 (%)    Monocytes Absolute 0.6  0.1 - 1.0 (K/uL)    Eosinophils Relative 2  0 - 5 (%)    Eosinophils Absolute 0.1  0.0 - 0.7 (K/uL)    Basophils Relative 1  0 - 1 (%)    Basophils Absolute 0.0  0.0 - 0.1 (K/uL)   PROTIME-INR     Status:  Normal   Collection Time   06/16/11 10:26 AM      Component Value Range Comment   Prothrombin Time 12.7  11.6 - 15.2 (seconds)    INR 0.93  0.00 - 1.49    CARDIAC PANEL(CRET KIN+CKTOT+MB+TROPI)     Status: Normal   Collection Time   06/16/11 11:05 AM      Component Value Range Comment   Total CK 196  7 - 232 (U/L)    CK, MB 2.9  0.3 - 4.0 (ng/mL)    Troponin I <0.30  <0.30 (ng/mL)    Relative Index 1.5  0.0 - 2.5    PRO B NATRIURETIC PEPTIDE     Status: Normal   Collection Time   06/16/11 11:05 AM      Component Value Range Comment   Pro B Natriuretic peptide (BNP) 60.2  0 - 125 (pg/mL)   COMPREHENSIVE METABOLIC PANEL     Status: Abnormal   Collection Time   06/16/11 11:05 AM      Component Value Range Comment   Sodium 137  135 - 145 (mEq/L)    Potassium 3.6  3.5 - 5.1 (mEq/L)    Chloride 102  96 - 112 (mEq/L)    CO2 25  19 - 32 (mEq/L)    Glucose, Bld 95  70 - 99 (mg/dL)    BUN 13  6 - 23 (mg/dL)    Creatinine, Ser 1.61 (*) 0.50 - 1.35 (mg/dL)    Calcium 9.5  8.4 - 10.5 (mg/dL)    Total Protein 7.4  6.0 - 8.3 (g/dL)    Albumin 3.6  3.5 - 5.2 (g/dL)    AST 12  0 - 37 (U/L)    ALT 10  0 - 53 (U/L)    Alkaline Phosphatase 111  39 - 117 (U/L)    Total Bilirubin 0.7  0.3 - 1.2 (mg/dL)    GFR calc non Af Amer 55 (*) >90 (mL/min)    GFR calc Af Amer 64 (*) >90 (mL/min)   URINALYSIS, ROUTINE W REFLEX MICROSCOPIC     Status: Normal   Collection Time   06/16/11 12:10 PM      Component Value Range Comment   Color, Urine YELLOW  YELLOW     APPearance CLEAR  CLEAR     Specific Gravity, Urine 1.014  1.005 - 1.030     pH 6.0  5.0 - 8.0     Glucose, UA NEGATIVE  NEGATIVE (mg/dL)    Hgb urine dipstick NEGATIVE  NEGATIVE     Bilirubin Urine NEGATIVE  NEGATIVE     Ketones, ur NEGATIVE  NEGATIVE (mg/dL)    Protein, ur NEGATIVE  NEGATIVE (mg/dL)    Urobilinogen, UA 0.2  0.0 - 1.0 (mg/dL)    Nitrite NEGATIVE  NEGATIVE     Leukocytes, UA NEGATIVE  NEGATIVE  MICROSCOPIC NOT DONE ON URINES  WITH NEGATIVE PROTEIN, BLOOD, LEUKOCYTES, NITRITE, OR GLUCOSE <1000 mg/dL.    Radiology Reports: Dg Chest 2 View  06/16/2011  *RADIOLOGY REPORT*  Clinical Data: Shortness of breath.  CHEST - 2 VIEW  Comparison: PA and lateral chest 11/07/2008.  Findings: There is mild cardiomegaly but no pulmonary edema.  Lungs are clear.  No pneumothorax or pleural fluid.  The patient has some degenerative disease about the right shoulder.  IMPRESSION: Mild cardiomegaly without acute disease.  Original Report Authenticated By: Bernadene Bell. Maricela Curet, M.D.    Electrocardiogram: Sinus tachycardia at 109 beats per minute. Normal axis. Normal Intervals. No Q waves. No concerning ST or T-wave changes are noted.  Assessment/Plan  Principal Problem:  *Edema Active Problems:  OBESITY  HYPERTENSION  CAD  CARDIOMYOPATHY, ISCHEMIC  C O P D  Tachycardia   #1 bilateral pedal edema: This is most likely due to a result of fluid overload as the patient has not been taking his medications. He has known history of ischemic cardiomyopathy. However, her EF from 2011, was 45%. He does have diastolic dysfunction. He'll be given intravenous Lasix. We'll get venous Dopplers to rule out DVT.  #2 sinus tachycardia: This could be because of the  fact, that he's been off his beta blocker. Carvedilol has been given in the ED. He'll be monitored on telemetry. TSH will be checked. Repeat echocardiogram will be ordered.  #3 lower extremity ulcers: Will get wound care to see this patient.  #4 history of CAD: Appears to be stable. Continue with aspirin, Plavix beta blockers.  #5 history of hypertension: Monitor blood pressures closely.  #6 history of obesity. He may have a history of sleep apnea as well. However, this needs to be corroborated.  DVT, prophylaxis will be initiated. He is a full code.  Further management decisions will depend on results of further testing and patient's response to treatment.  [facility]  [facility] Pager [phone]  06/16/2011, 4:16 PM

## 2011-06-16 NOTE — ED Notes (Signed)
Pt sees Dr. Hillis Range

## 2011-06-16 NOTE — ED Provider Notes (Signed)
History     CSN: [id number]  Arrival date & time 06/16/11  1478   First MD Initiated Contact with Patient 06/16/11 [id number]      Chief Complaint  Patient presents with  . Leg Swelling    (Consider location/radiation/quality/duration/timing/severity/associated sxs/prior treatment) The history is provided by the patient. No language interpreter was used.  61 year old male history of CHF, coronary artery disease, hypertension presents with shortness of breath and bilateral lower extremity edema is gotten progressively worse over the past 3 weeks but has been present for the past 6 weeks. He states he has not taken his medications which include aspirin, Coreg, Plavix, Cardizem, Lasix, lisinopril, Crestor over the past 6 weeks. He ran out of medications and failed to get them refilled. He also states he's had difficulty affording all of his medications. He denies chest pain.  Past Medical History  Diagnosis Date  . Hypertension   . Hypercholesteremia   . CHF (congestive heart failure)   . Coronary artery disease     Past Surgical History  Procedure Date  . Coronary stent placement     x2    No family history on file.  History  Substance Use Topics  . Smoking status: Former Games developer  . Smokeless tobacco: Not on file  . Alcohol Use: No     not since New Year's      Review of Systems  Constitutional: Negative for fever, activity change, appetite change and fatigue.  HENT: Negative for congestion, sore throat, rhinorrhea, neck pain and neck stiffness.   Respiratory: Positive for shortness of breath. Negative for cough.   Cardiovascular: Positive for leg swelling. Negative for chest pain and palpitations.  Gastrointestinal: Negative for nausea, vomiting and abdominal pain.  Genitourinary: Negative for dysuria, urgency, frequency and flank pain.  Musculoskeletal: Negative for myalgias, back pain and arthralgias.  Neurological: Negative for dizziness, weakness, light-headedness,  numbness and headaches.  All other systems reviewed and are negative.    Allergies  Review of patient's allergies indicates no known allergies.  Home Medications   Current Outpatient Rx  Name Route Sig Dispense Refill  . ASPIRIN 325 MG PO TABS Oral Take 325 mg by mouth daily.    [name] Kitchen CARVEDILOL 25 MG PO TABS Oral Take 25 mg by mouth 2 (two) times daily with a meal.    . CLOPIDOGREL BISULFATE 75 MG PO TABS Oral Take 75 mg by mouth daily.    [name] Kitchen DILTIAZEM HCL ER COATED BEADS 240 MG PO CP24 Oral Take 240 mg by mouth daily.    . FUROSEMIDE 40 MG PO TABS Oral Take 40 mg by mouth 2 (two) times daily.    [name] Kitchen LISINOPRIL 20 MG PO TABS Oral Take 20 mg by mouth daily.    [name] Kitchen NITROGLYCERIN 0.4 MG SL SUBL Sublingual Place 0.4 mg under the tongue every 5 (five) minutes as needed.    [name] Kitchen POTASSIUM CHLORIDE CRYS ER 20 MEQ PO TBCR Oral Take 20 mEq by mouth daily.    [name] Kitchen ROSUVASTATIN CALCIUM 40 MG PO TABS Oral Take 40 mg by mouth daily.      BP 135/82  Pulse 112  Temp(Src) 98.9 F (37.2 C) (Oral)  Resp 16  Wt 354 lb (160.573 kg)  SpO2 98%  Physical Exam  Nursing note and vitals reviewed. Constitutional: He is oriented to person, place, and time. He appears well-developed and well-nourished.  HENT:  Head: Normocephalic and atraumatic.  Mouth/Throat: Oropharynx is clear and moist.  Eyes: Conjunctivae and EOM are  normal. Pupils are equal, round, and reactive to light.  Neck: Normal range of motion. Neck supple.  Cardiovascular: Regular rhythm, normal heart sounds and intact distal pulses.  Exam reveals no gallop and no friction rub.   No murmur heard.      Tachycardic rate  Pulmonary/Chest: Effort normal. No respiratory distress.       Diffusely diminished breath sounds  Abdominal: Soft. Bowel sounds are normal. There is no tenderness. There is no rebound and no guarding.  Musculoskeletal: Normal range of motion. He exhibits edema (4+ pitting). He exhibits no tenderness.  Neurological: He is alert and  oriented to person, place, and time.  Skin: Skin is warm and dry.       Scaly skin diffusely worst in LE bilaterally    ED Course  Procedures (including critical care time)   Date: 06/16/2011  Rate: 109  Rhythm: sinus tachycardia  QRS Axis: normal  Intervals: normal  ST/T Wave abnormalities: normal  Conduction Disutrbances:none  Narrative Interpretation:   Old EKG Reviewed: unchanged  Labs Reviewed  COMPREHENSIVE METABOLIC PANEL - Abnormal; Notable for the following:    Creatinine, Ser 1.38 (*)    GFR calc non Af Amer 55 (*)    GFR calc Af Amer 64 (*)    All other components within normal limits  CBC  DIFFERENTIAL  URINALYSIS, ROUTINE W REFLEX MICROSCOPIC  PROTIME-INR  CARDIAC PANEL(CRET KIN+CKTOT+MB+TROPI)  PRO B NATRIURETIC PEPTIDE   Dg Chest 2 View  06/16/2011  *RADIOLOGY REPORT*  Clinical Data: Shortness of breath.  CHEST - 2 VIEW  Comparison: PA and lateral chest 11/07/2008.  Findings: There is mild cardiomegaly but no pulmonary edema.  Lungs are clear.  No pneumothorax or pleural fluid.  The patient has some degenerative disease about the right shoulder.  IMPRESSION: Mild cardiomegaly without acute disease.  Original Report Authenticated By: Bernadene Bell. D'ALESSIO, M.D.     1. Edema   2. Tachycardia       MDM  Edema with tachycardia with any sort of exertion. His car he went from low 100s to approximately 150 with just moving in the bed. He has no chest pain but does have shortness of breath. He has been noncompliant with his medications. His tachycardia is likely secondary to no bradycardia block aid however I feel he warrants a observation admission. Discussed with the [facility] who accepted the patient for admission. She's provided his home doses of medication and 40 mg of IV Lasix.        Dayton Bailiff, MD 06/16/11 1242

## 2011-06-16 NOTE — ED Notes (Signed)
Dr. Brooke Dare was given the patient latest EKG.

## 2011-06-16 NOTE — ED Notes (Signed)
Pt states "my legs have probably been swelling for 3 wks, haven't seen the cardiologist in over a yr, haven't been taking the lasix because of the money"; pt presents with bilat edema to LE's

## 2011-06-16 NOTE — ED Notes (Signed)
pts pulse 140, pt denies pain states he moved up in the bed. EDP King notified no new orders given, will continue to monitor.

## 2011-06-17 DIAGNOSIS — I509 Heart failure, unspecified: Secondary | ICD-10-CM

## 2011-06-17 DIAGNOSIS — M7989 Other specified soft tissue disorders: Secondary | ICD-10-CM

## 2011-06-17 LAB — CBC
HCT: 37.7 % — ABNORMAL LOW (ref 39.0–52.0)
Hemoglobin: 11.8 g/dL — ABNORMAL LOW (ref 13.0–17.0)
MCHC: 31.3 g/dL (ref 30.0–36.0)

## 2011-06-17 LAB — COMPREHENSIVE METABOLIC PANEL
ALT: 9 U/L (ref 0–53)
AST: 10 U/L (ref 0–37)
Calcium: 9.1 mg/dL (ref 8.4–10.5)
GFR calc Af Amer: 60 mL/min — ABNORMAL LOW (ref >90)
Glucose, Bld: 90 mg/dL (ref 70–99)
Sodium: 138 mEq/L (ref 135–145)
Total Protein: 6.6 g/dL (ref 6.0–8.3)

## 2011-06-17 LAB — CARDIAC PANEL(CRET KIN+CKTOT+MB+TROPI)
CK, MB: 1.9 ng/mL (ref 0.3–4.0)
Relative Index: 1.1 (ref 0.0–2.5)

## 2011-06-17 LAB — MAGNESIUM: Magnesium: 2.2 mg/dL (ref 1.5–2.5)

## 2011-06-17 LAB — TSH: TSH: 1.018 u[IU]/mL (ref 0.350–4.500)

## 2011-06-17 MED ORDER — FUROSEMIDE 10 MG/ML IJ SOLN
80.0000 mg | Freq: Two times a day (BID) | INTRAMUSCULAR | Status: DC
  Administered 2011-06-17 – 2011-06-19 (×5): 80 mg via INTRAVENOUS
  Filled 2011-06-17 (×7): qty 8

## 2011-06-17 MED ORDER — POTASSIUM CHLORIDE CRYS ER 20 MEQ PO TBCR
40.0000 meq | EXTENDED_RELEASE_TABLET | Freq: Every day | ORAL | Status: DC
  Administered 2011-06-17 – 2011-06-19 (×3): 40 meq via ORAL
  Filled 2011-06-17 (×3): qty 2

## 2011-06-17 MED ORDER — FLUTICASONE-SALMETEROL 250-50 MCG/DOSE IN AEPB
1.0000 | INHALATION_SPRAY | Freq: Two times a day (BID) | RESPIRATORY_TRACT | Status: DC
Start: 2011-06-17 — End: 2011-06-19
  Administered 2011-06-17 – 2011-06-19 (×5): 1 via RESPIRATORY_TRACT
  Filled 2011-06-17: qty 14

## 2011-06-17 MED ORDER — LEVALBUTEROL HCL 0.63 MG/3ML IN NEBU
0.6300 mg | INHALATION_SOLUTION | RESPIRATORY_TRACT | Status: DC | PRN
  Filled 2011-06-17: qty 3

## 2011-06-17 MED ORDER — LEVALBUTEROL HCL 0.63 MG/3ML IN NEBU
0.6300 mg | INHALATION_SOLUTION | Freq: Four times a day (QID) | RESPIRATORY_TRACT | Status: DC | PRN
  Filled 2011-06-17: qty 3

## 2011-06-17 MED ORDER — LEVALBUTEROL TARTRATE 45 MCG/ACT IN AERO
2.0000 | INHALATION_SPRAY | Freq: Three times a day (TID) | RESPIRATORY_TRACT | Status: DC
  Administered 2011-06-18 – 2011-06-19 (×4): 2 via RESPIRATORY_TRACT
  Filled 2011-06-17: qty 15

## 2011-06-17 MED ORDER — LEVALBUTEROL TARTRATE 45 MCG/ACT IN AERO: 2.0000 | INHALATION_SPRAY | Freq: Four times a day (QID) | RESPIRATORY_TRACT | Status: DC | PRN

## 2011-06-17 MED ORDER — LEVALBUTEROL HCL 0.63 MG/3ML IN NEBU
0.6300 mg | INHALATION_SOLUTION | Freq: Four times a day (QID) | RESPIRATORY_TRACT | Status: DC
  Administered 2011-06-17 (×3): 0.63 mg via RESPIRATORY_TRACT
  Filled 2011-06-17 (×5): qty 3

## 2011-06-17 NOTE — Progress Notes (Signed)
Talked to patient about follow up medical care and medications. Patient has insurance - [employer], not working and spouse is retired. Patient stated that some of his medications are expensive and cannot afford them. Information given to patient on a Medication assistance program from [employer]- patient is to complete application at home and mail it in. Also informed patient to write down the drug company direct telephone number for further questions. Talked to patient at length about having a primary care physician. Patient stated that he has been healthy all of his life and didn't think that he needed one. Informed patient that a PCP is needed for a point of contact for his medical care; patient acknowledged understanding and stated that he will get a PCP; B Ave Filter RN, BSN, MHA.

## 2011-06-17 NOTE — Evaluation (Signed)
Occupational Therapy Evaluation Patient Details Name: Larry [name] MRN: [id number] DOB: [birth year] Today's Date: 06/17/2011 Time: 4098-1191 OT Time Calculation (min): 12 min  OT Assessment / Plan / Recommendation Clinical Impression  This 61 year old male was admitted with edema.  He has a h/o CAD, cardiomyopathy.  He is overall mod I level for ambulation and needs min A for LB ADLs secondary to wound on LLE and edema. Wife can assist with LB ADLs if needed.  Pt does not need any further OT nor DME.       OT Assessment  Patient does not need any further OT services    Follow Up Recommendations  No OT follow up    Equipment Recommendations  None recommended by PT/OT    Frequency      Precautions / Restrictions Precautions Precautions: None Restrictions Weight Bearing Restrictions: No   Pertinent Vitals/Pain     ADL  Grooming: Simulated;Modified independent Where Assessed - Grooming: Standing at sink Upper Body Bathing: Simulated;Modified independent Where Assessed - Upper Body Bathing: Sitting, bed;Unsupported Lower Body Bathing: Simulated;Minimal assistance Where Assessed - Lower Body Bathing: Sit to stand from bed Upper Body Dressing: Simulated;Modified independent Where Assessed - Upper Body Dressing: Sitting, bed;Unsupported Lower Body Dressing: Simulated;Minimal assistance Where Assessed - Lower Body Dressing: Sit to stand from bed Toilet Transfer: Simulated;Modified independent;Other (comment) (bed:  pt has standard commode but moves well) Toilet Transfer Method: Ambulating Toileting - Clothing Manipulation: Simulated;Modified independent Where Assessed - Toileting Clothing Manipulation: Sit to stand from 3-in-1 or toilet Toileting - Hygiene: Modified independent;Simulated Where Assessed - Toileting Hygiene: Standing Tub/Shower Transfer: Not assessed (do not anticipate pt will have difficulty) Tub/Shower Transfer Method: Ambulating Ambulation Related to  ADLs: ambulated with PT:  coeval.  No LOB ADL Comments: pt tends to hold breath during bed mob; has edema which impacts LB ADLs and wound on L lower leg    OT Goals    Visit Information  Assistance Needed: +1 PT/OT Co-Evaluation/Treatment: Yes    Subjective Data  Subjective: "I don't exactly feel back to normal" Patient Stated Goal: wound to heal and home   Prior Functioning  Home Living Lives With: Spouse Available Help at Discharge: Family Type of Home: House Home Access: Level entry Home Layout: One level Bathroom Shower/Tub: Engineer, manufacturing systems: Standard Home Adaptive Equipment: Straight cane;Other (comment) (doesn't use:  in house) Additional Comments: has sink next to commode Prior Function Driving: Yes Vocation: Other (comment) Communication Communication: No difficulties    Cognition  Overall Cognitive Status: Appears within functional limits for tasks assessed/performed Arousal/Alertness: Awake/alert Orientation Level: Appears intact for tasks assessed Behavior During Session: [facility] for tasks performed    Extremity/Trunk Assessment Right Upper Extremity Assessment RUE ROM/Strength/Tone: Within functional levels Left Upper Extremity Assessment LUE ROM/Strength/Tone: Within functional levels Right Lower Extremity Assessment RLE ROM/Strength/Tone: Within functional levels RLE Coordination: WFL - gross/fine motor Left Lower Extremity Assessment LLE ROM/Strength/Tone: Within functional levels LLE Coordination: WFL - gross/fine motor Trunk Assessment Trunk Assessment: Normal   Mobility Bed Mobility Bed Mobility: Supine to Sit;Sit to Supine Supine to Sit: 6: Modified independent (Device/Increase time) Sit to Supine: 6: Modified independent (Device/Increase time) Transfers Sit to Stand: 6: Modified independent (Device/Increase time) Stand to Sit: 6: Modified independent (Device/Increase time)   Exercise    Balance Balance Balance Assessed: No  End  of Session OT - End of Session Activity Tolerance: Patient tolerated treatment well Patient left: in bed;with call bell/phone within reach  [facility], OTR/L  [phone] 06/17/2011 Larry [name] 06/17/2011, 4:17 PM

## 2011-06-17 NOTE — Progress Notes (Signed)
PCP: Doesn't have one  Brief HPI:  This is a 61 year old, African American male, with a past medical history of coronary artery disease, ischemic cardiomyopathy, hypertension who has been noncompliant with his medications for the last the 2 months or so. He tells me that he ran out of his medications and never bothered to get them refilled. And, then about 2-3 weeks ago, he started noticing that his legs were swelling up. This got progressively worse. Denies any pain per se, but has been having some discomfort, especially with ambulation. He's been sedantary over the last few weeks. He's also noticed some open wounds in both his legs, which have been draining clear liquid. He has shortness of breath at times. He requires only one pillow to sleep. Denies any symptoms suggestive of PND. Denies any chest pains. No nausea, vomiting, fever or chills. Denies any sick contacts. In the emergency room minimal exertion will cause his heart rate to go into SVT, which appears to be regular. He has received carvedilol in the ED, and hopefully his heart rate will be better controlled. With these symptoms he denies any form of chest pain.  Past medical history:  Past Medical History   Diagnosis  Date   .  Hypertension    .  Hypercholesteremia    .  CHF (congestive heart failure)    .  Coronary artery disease    .  COPD (chronic obstructive pulmonary disease)    .  Asthma      Consultants: None  Procedures: Noe  Subjective: Patient complaining of wheezing this morning. Denies cough. Denies chest pain. Slept well overnight. Passing urine.  Objective: Vital signs in last 24 hours: Temp:  [98 F (36.7 C)-98.9 F (37.2 C)] 98 F (36.7 C) (04/26 0631) Pulse Rate:  [78-150] 78  (04/26 0631) Resp:  [16-20] 16  (04/26 0631) BP: (114-161)/(64-97) 136/84 mmHg (04/26 0631) SpO2:  [97 %-99 %] 98 % (04/26 0631) Weight:  [156.627 kg (345 lb 4.8 oz)-160.573 kg (354 lb)] 156.627 kg (345 lb 4.8 oz) (04/26  0631) Weight change:  Last BM Date: 06/16/11  Intake/Output from previous day: 04/25 0701 - 04/26 0700 In: 480 [P.O.:480] Out: 1660 [Urine:1660] Intake/Output this shift:    Tele: 2 episodes of NSVT, PVC's noted.  General appearance: alert, cooperative, appears stated age and moderately obese Head: Normocephalic, without obvious abnormality, atraumatic Back: symmetric, no curvature. ROM normal. No CVA tenderness. Resp: few end exp wheezing bilaterally, no crackles Cardio: regular rate and rhythm, S1, S2 normal, no murmur, click, rub or gallop GI: soft, non-tender; bowel sounds normal; no masses,  no organomegaly Extremities: 4+ edema with dry scaly skin, with 2 open wounds one on each leg Pulses: difficult to palpate pedal pulses Skin: wounds as described earlier Neurologic: Grossly normal without focal deficits.  Lab Results:  Basename 06/17/11 0453 06/16/11 1026  WBC 5.3 6.7  HGB 11.8* 13.1  HCT 37.7* 40.7  PLT 183 229   BMET  Basename 06/17/11 0453 06/16/11 1105  NA 138 137  K 3.6 3.6  CL 102 102  CO2 27 25  GLUCOSE 90 95  BUN 17 13  CREATININE 1.45* 1.38*  CALCIUM 9.1 9.5  ALT 9 10    Studies/Results: Dg Chest 2 View  06/16/2011  *RADIOLOGY REPORT*  Clinical Data: Shortness of breath.  CHEST - 2 VIEW  Comparison: PA and lateral chest 11/07/2008.  Findings: There is mild cardiomegaly but no pulmonary edema.  Lungs are clear.  No pneumothorax  or pleural fluid.  The patient has some degenerative disease about the right shoulder.  IMPRESSION: Mild cardiomegaly without acute disease.  Original Report Authenticated By: Bernadene Bell. Maricela Curet, M.D.    Medications:  Scheduled:   . aspirin  325 mg Oral Daily  . atorvastatin  80 mg Oral q1800  . carvedilol  25 mg Oral Once  . carvedilol  25 mg Oral BID WC  . clopidogrel  75 mg Oral Q breakfast  . diltiazem  240 mg Oral Once  . diltiazem  240 mg Oral Daily  . enoxaparin  40 mg Subcutaneous Q24H  . furosemide  40 mg  Intravenous Once  . furosemide  40 mg Intravenous Q12H  . lisinopril  20 mg Oral Daily  . sodium chloride  3 mL Intravenous Q12H  . DISCONTD: pneumococcal 23 valent vaccine  0.5 mL Intramuscular NOW    Assessment/Plan:  Principal Problem:  *Edema Active Problems:  OBESITY  HYPERTENSION  CAD  CARDIOMYOPATHY, ISCHEMIC  C O P D  Tachycardia    Bilateral pedal edema Still quite edematous. Not diuresing as much. Will increase dose of lasix. This is most likely due to a result of fluid overload as the patient has not been taking his medications. He has known history of ischemic cardiomyopathy. However, his EF from 2011, was 45%. He does have diastolic dysfunction as well. Await venous Dopplers to rule out DVT.   Wheezing CXR did not show any infiltrates. Patient denies cough. He was a heavy smoker till 2 years ago and could have COPD. Will give nebs and Advair. Monitor. If no improvement consider repeating CXR.  Sinus tachycardia Improved with BB. This could have been because of the fact, that he had been off his beta blocker. TSH normal. Echocardiogram will be ordered.   NSVT/PVC's Await ECHO. Monitor potassium closely. Check Magnesium. On BB.  Lower extremity ulcers Will get wound care to see this patient.   history of CAD Appears to be stable. Continue with aspirin, Plavix beta blockers.   History of hypertension Monitor blood pressures closely.   Possible CKD Monitor creatinine closely while diuresing.  History of obesity. He may have a history of sleep apnea as well. However, this needs to be corroborated.   DVT, prophylaxis with heparin. He is a full code.   Mobilize once dopplers have ruled out DVT.     LOS: 1 day   [facility]  [facility] Pager [phone] 06/17/2011, 8:24 AM

## 2011-06-17 NOTE — Consult Note (Signed)
WOC consult Note Reason for Consult:Bilateral lower extremity wounds Wound type: (Likely) venous insufficiency Pressure Ulcer POA:No Measurement:left LE posterior and lateral lower extremity wound measures 7cm x 15cm x .2cm.   Right lateral lower extremity wound measures 8cm x 6cm x .2cm  Wound WUJ:WJXBJ, moist, red Drainage (amount, consistency, odor) scant amount serous exudate Periwound: intact, edematous Dressing procedure/placement/frequency: I will recommend topical conservative therapy (soft silicone foam dressings) followed by the application of mild compression therapy via Unna's Boots (applied by [employer]).  We will begin with twice weekly therapy on Tuesdays and Fridays.  Patient will require follow up with either outpatient wound care center or Home Health RN for the changing of these boots until ulcers are healed. Dr. Rito Ehrlich consulted with my findings and recommendations.  It is believed that there is no arterial involvement and that with the supporting presentation of edema and the location of the ulcerations in the (primarily) malleolar regions that these are venous insufficiency ulcerations.  While an ABI is not obtained, we will proceed with mild compression therapy. I will not follow.  Please re-consult if needed. Thanks, Ladona Mow, MSN, RN, [facility], CWOCN [phone])

## 2011-06-17 NOTE — Progress Notes (Addendum)
OT Cancellation Note  ___Treatment cancelled today due to medical issues with patient which prohibited therapy  __x_ Treatment cancelled today due to patient receiving procedure or test (awaiting dopplers/results before proceeding with PT/OT evals.  ___ Treatment cancelled today due to patient's refusal to participate   ___ Treatment cancelled today due to   Signature: Judithann Sauger OTR/L [phone] 06/17/2011

## 2011-06-17 NOTE — Progress Notes (Signed)
  Echocardiogram 2D Echocardiogram has been performed.  Larry [name] A 06/17/2011, 12:11 PM

## 2011-06-17 NOTE — Progress Notes (Signed)
VASCULAR LAB PRELIMINARY  PRELIMINARY  PRELIMINARY  PRELIMINARY  Bilateral lower extremity venous duplex completed.    Preliminary report:  Bilateral:  No obvious evidence of DVT, superficial thrombosis, or Baker's Cyst. Technically difficult due to Body habitus.   Anhar Mcdermott D, RVS 06/17/2011, 1:24 PM

## 2011-06-17 NOTE — Evaluation (Signed)
Physical Therapy Evaluation Patient Details Name: Larry [name] MRN: [id number] DOB: 1950/12/14 Today's Date: 06/17/2011 Time: 4098-1191 PT Time Calculation (min): 11 min  PT Assessment / Plan / Recommendation Clinical Impression  Pt presents with diagnosis of bil LE edema and bil leg wounds. Pt mobilizing well during eval. No assist needed. 1x eval. Do not anticipate any follow-up therapy needs at discharge.     PT Assessment  Patent does not need any further PT services    Follow Up Recommendations  No PT follow up    Equipment Recommendations  None recommended by PT    Frequency      Precautions / Restrictions Precautions Precautions: None Restrictions Weight Bearing Restrictions: No   Pertinent Vitals/Pain       Mobility  Bed Mobility Bed Mobility: Supine to Sit;Sit to Supine Supine to Sit: 6: Modified independent (Device/Increase time) Sit to Supine: 6: Modified independent (Device/Increase time) Transfers Transfers: Sit to Stand;Stand to Sit Sit to Stand: 6: Modified independent (Device/Increase time) Stand to Sit: 6: Modified independent (Device/Increase time) Ambulation/Gait Ambulation/Gait Assistance: 6: Modified independent (Device/Increase time) Ambulation Distance (Feet): 200 Feet Assistive device: None Ambulation/Gait Assistance Details: Good gait speed. No LOB Gait Pattern: Within Functional Limits    Exercises     PT Goals    Visit Information  Last PT Received On: 06/17/11 Assistance Needed: +1 PT/OT Co-Evaluation/Treatment: Yes    Subjective Data  Subjective: " I feel fine...but not quite back to normal" Patient Stated Goal: Wound care for legs. Home   Prior Functioning  Home Living Lives With: Spouse Available Help at Discharge: Family Type of Home: House Home Access: Level entry Home Layout: One level Bathroom Shower/Tub: Engineer, manufacturing systems: Standard Home Adaptive Equipment: Straight cane;Other (comment)  (doesn't use:  in house) Additional Comments: has sink next to commode Prior Function Driving: Yes Vocation: Other (comment) Comments:  (not working at this time) Musician: No difficulties    Cognition  Overall Cognitive Status: Appears within functional limits for tasks assessed/performed Arousal/Alertness: Awake/alert Orientation Level: Appears intact for tasks assessed Behavior During Session: [facility] for tasks performed    Extremity/Trunk Assessment Right Lower Extremity Assessment RLE ROM/Strength/Tone: Within functional levels RLE Coordination: WFL - gross/fine motor Left Lower Extremity Assessment LLE ROM/Strength/Tone: Within functional levels LLE Coordination: WFL - gross/fine motor Trunk Assessment Trunk Assessment: Normal   Balance Balance Balance Assessed: No  End of Session PT - End of Session Activity Tolerance: Patient tolerated treatment well Patient left: in bed;with call bell/phone within reach   Rebeca Alert [facility] 06/17/2011, 3:11 PM [phone]

## 2011-06-17 NOTE — Progress Notes (Signed)
PT/OT/ST Cancellation Note  ___Treatment cancelled today due to medical issues with patient which prohibited therapy  _x__ Treatment cancelled today due to patient receiving procedure or test. Awaiting doppler test/results before proceeding with PT/OT evals. Will check back as schedule permits. Thanks.   ___ Treatment cancelled today due to patient's refusal to participate   ___ Treatment cancelled today due to   Signature: Rebeca Alert, PT [phone]

## 2011-06-18 LAB — BASIC METABOLIC PANEL
BUN: 20 mg/dL (ref 6–23)
Calcium: 9.4 mg/dL (ref 8.4–10.5)
Creatinine, Ser: 1.57 mg/dL — ABNORMAL HIGH (ref 0.50–1.35)
GFR calc non Af Amer: 47 mL/min — ABNORMAL LOW (ref >90)
Glucose, Bld: 94 mg/dL (ref 70–99)

## 2011-06-18 LAB — CBC
HCT: 38 % — ABNORMAL LOW (ref 39.0–52.0)
Hemoglobin: 11.8 g/dL — ABNORMAL LOW (ref 13.0–17.0)
MCH: 26.8 pg (ref 26.0–34.0)
MCHC: 31.1 g/dL (ref 30.0–36.0)
MCV: 86.2 fL (ref 78.0–100.0)

## 2011-06-18 MED ORDER — PNEUMOCOCCAL VAC POLYVALENT 25 MCG/0.5ML IJ INJ
0.5000 mL | INJECTION | INTRAMUSCULAR | Status: AC
  Administered 2011-06-19: 0.5 mL via INTRAMUSCULAR
  Filled 2011-06-18: qty 0.5

## 2011-06-18 NOTE — Progress Notes (Signed)
PCP: Doesn't have one. Used to follow with Dr. Johney Frame  Brief HPI:  This is a 61 year old, African American male, with a past medical history of coronary artery disease, ischemic cardiomyopathy, hypertension who has been noncompliant with his medications for the last the 2 months or so. He mentioned that he ran out of his medications and never bothered to get them refilled. And, then about 2-3 weeks ago, he started noticing that his legs were swelling up. This got progressively worse. Denies any pain per se, but has been having some discomfort, especially with ambulation. He's been sedantary over the last few weeks. He's also noticed some open wounds in both his legs, which have been draining clear liquid. He has shortness of breath at times. He requires only one pillow to sleep. Denies any symptoms suggestive of PND. Denies any chest pains. No nausea, vomiting, fever or chills. Denies any sick contacts. In the emergency room even minimal exertion will cause his heart rate to go into SVT, which appears to be regular. With these symptoms he denies any form of chest pain.  Past medical history:  Past Medical History   Diagnosis  Date   .  Hypertension    .  Hypercholesteremia    .  CHF (congestive heart failure)    .  Coronary artery disease    .  COPD (chronic obstructive pulmonary disease)    .  Asthma      Consultants: None  Procedures: Noe  Subjective: Patient states wheezing is better. Denies any other complaints.  Objective: Vital signs in last 24 hours: Temp:  [97.5 F (36.4 C)-98.6 F (37 C)] 98.6 F (37 C) (04/27 0500) Pulse Rate:  [92-96] 92  (04/27 0500) Resp:  [18-20] 18  (04/27 0500) BP: (91-122)/(55-78) 121/76 mmHg (04/27 0500) SpO2:  [95 %-98 %] 95 % (04/27 0500) Weight:  [156.31 kg (344 lb 9.6 oz)] 156.31 kg (344 lb 9.6 oz) (04/27 0500) Weight change: -4.264 kg (-9 lb 6.4 oz) Last BM Date: 06/16/11  Intake/Output from previous day: 04/26 0701 - 04/27 0700 In:  720 [P.O.:720] Out: 3190 [Urine:3190] Intake/Output this shift:    Tele: PVC's noted. No NSVT since yesterday.  General appearance: alert, cooperative, appears stated age and moderately obese Head: Normocephalic, without obvious abnormality, atraumatic Back: symmetric, no curvature. ROM normal. No CVA tenderness. Resp: Clear to auscultation today. Cardio: regular rate and rhythm, S1, S2 normal, no murmur, click, rub or gallop GI: soft, non-tender; bowel sounds normal; no masses,  no organomegaly Extremities: Covered with compression dressing Skin: wounds as described earlier Neurologic: Grossly normal without focal deficits.  Lab Results:  Basename 06/18/11 0526 06/17/11 0453  WBC 5.9 5.3  HGB 11.8* 11.8*  HCT 38.0* 37.7*  PLT 213 183   BMET  Basename 06/18/11 0526 06/17/11 0453 06/16/11 1105  NA 137 138 --  K 3.6 3.6 --  CL 99 102 --  CO2 28 27 --  GLUCOSE 94 90 --  BUN 20 17 --  CREATININE 1.57* 1.45* --  CALCIUM 9.4 9.1 --  ALT -- 9 10    Studies/Results: Dg Chest 2 View  06/16/2011  *RADIOLOGY REPORT*  Clinical Data: Shortness of breath.  CHEST - 2 VIEW  Comparison: PA and lateral chest 11/07/2008.  Findings: There is mild cardiomegaly but no pulmonary edema.  Lungs are clear.  No pneumothorax or pleural fluid.  The patient has some degenerative disease about the right shoulder.  IMPRESSION: Mild cardiomegaly without acute disease.  Original  Report Authenticated By: Bernadene Bell. D'ALESSIO, M.D.   2D ECHO Left ventricle: The cavity size was normal. Wall thickness was normal. Systolic function was mildly reduced. The estimated ejection fraction was in the range of 45% to 50%. Diffuse hypokinesis. Doppler parameters are consistent with abnormal left ventricular relaxation (grade 1 diastolic dysfunction).    Medications:  Scheduled:    . aspirin  325 mg Oral Daily  . atorvastatin  80 mg Oral q1800  . carvedilol  25 mg Oral BID WC  . clopidogrel  75 mg Oral Q  breakfast  . diltiazem  240 mg Oral Daily  . enoxaparin  40 mg Subcutaneous Q24H  . Fluticasone-Salmeterol  1 puff Inhalation BID  . furosemide  80 mg Intravenous BID  . levalbuterol  2 puff Inhalation TID  . lisinopril  20 mg Oral Daily  . potassium chloride  40 mEq Oral Daily  . sodium chloride  3 mL Intravenous Q12H  . DISCONTD: levalbuterol  0.63 mg Nebulization Q6H    Assessment/Plan:  Principal Problem:  *Edema Active Problems:  OBESITY  HYPERTENSION  CAD  CARDIOMYOPATHY, ISCHEMIC  C O P D  Tachycardia    Bilateral pedal edema Diuresed well yesterday. Continue with current dose of lasix for now. Will transition to oral tomorrow. This is most likely due to a result of fluid overload as the patient has not been taking his medications. He has known history of ischemic cardiomyopathy. Echo showed Ef of 45% with DD. No DVT.   Wheezing Resolved with inhalers/nebs. CXR did not show any infiltrates. Patient denies cough. He was a heavy smoker till 2 years ago and could have COPD.   Sinus tachycardia Improved with BB. This could have been because of the fact, that he had been off his beta blocker. TSH normal. Echocardiogram as above.   NSVT/PVC's Monitor electrolytes closely. On BB. Echo results noted. No need for immediate intervention. Can follow with cards as OP.  Lower extremity ulcers Appreciate wound care input..   history of CAD Appears to be stable. Continue with aspirin, Plavix beta blockers.   History of hypertension Monitor blood pressures closely.   Possible CKD Mild increase in creat due to diuresis. He like has CKD. Monitor creatinine closely while diuresing.  History of obesity. He may have a history of sleep apnea as well.   DVT, prophylaxis with heparin. He is a full code.   Disposition: Possible discharge in AM    LOS: 2 days   [facility]  [facility] Pager [phone] 06/18/2011, 9:41 AM

## 2011-06-19 LAB — BASIC METABOLIC PANEL
Calcium: 9.2 mg/dL (ref 8.4–10.5)
Creatinine, Ser: 1.77 mg/dL — ABNORMAL HIGH (ref 0.50–1.35)
GFR calc non Af Amer: 41 mL/min — ABNORMAL LOW (ref >90)
Sodium: 136 mEq/L (ref 135–145)

## 2011-06-19 LAB — CBC
MCH: 26.9 pg (ref 26.0–34.0)
MCHC: 31.3 g/dL (ref 30.0–36.0)
Platelets: 180 10*3/uL (ref 150–400)

## 2011-06-19 MED ORDER — FLUTICASONE-SALMETEROL 250-50 MCG/DOSE IN AEPB: 1.0000 | INHALATION_SPRAY | Freq: Two times a day (BID) | RESPIRATORY_TRACT | Status: DC

## 2011-06-19 MED ORDER — DILTIAZEM HCL ER COATED BEADS 240 MG PO CP24: 240.0000 mg | ORAL_CAPSULE | Freq: Every day | ORAL | Status: DC

## 2011-06-19 MED ORDER — CARVEDILOL 25 MG PO TABS: 25.0000 mg | ORAL_TABLET | Freq: Two times a day (BID) | ORAL | Status: DC

## 2011-06-19 MED ORDER — NITROGLYCERIN 0.4 MG SL SUBL: 0.4000 mg | SUBLINGUAL_TABLET | SUBLINGUAL | Status: DC | PRN

## 2011-06-19 MED ORDER — LISINOPRIL 20 MG PO TABS: 20.0000 mg | ORAL_TABLET | Freq: Every day | ORAL | Status: DC

## 2011-06-19 MED ORDER — POTASSIUM CHLORIDE CRYS ER 20 MEQ PO TBCR: 20.0000 meq | EXTENDED_RELEASE_TABLET | Freq: Every day | ORAL | Status: DC

## 2011-06-19 MED ORDER — ROSUVASTATIN CALCIUM 40 MG PO TABS: 40.0000 mg | ORAL_TABLET | Freq: Every day | ORAL | Status: DC

## 2011-06-19 MED ORDER — ASPIRIN 325 MG PO TABS: 325.0000 mg | ORAL_TABLET | Freq: Every day | ORAL | Status: DC

## 2011-06-19 MED ORDER — CLOPIDOGREL BISULFATE 75 MG PO TABS: 75.0000 mg | ORAL_TABLET | Freq: Every day | ORAL | Status: DC

## 2011-06-19 MED ORDER — FUROSEMIDE 40 MG PO TABS: 40.0000 mg | ORAL_TABLET | Freq: Two times a day (BID) | ORAL | Status: DC

## 2011-06-19 NOTE — Discharge Summary (Signed)
Physician Discharge Summary  Patient ID: Larry [name] MRN: [id number] DOB/AGE: Oct 09, 1950 61 y.o.  Admit date: 06/16/2011 Discharge date: 06/19/2011  PCP: Attempting to see Dr. Daphine Deutscher with [facility] Used to be follow by Dr. Johney Frame with [facility] cards  Discharge Diagnoses:  Principal Problem:  *Edema Active Problems:  OBESITY  HYPERTENSION  CAD  CARDIOMYOPATHY, ISCHEMIC  C O P D  Tachycardia   Discharged Condition: fair  Initial History: This is a 61 year old, African American male, with a past medical history of coronary artery disease, ischemic cardiomyopathy, hypertension who has been noncompliant with his medications for the last the 2 months or so. He mentioned that he ran out of his medications and never bothered to get them refilled. And, then about 2-3 weeks ago, he started noticing that his legs were swelling up. This got progressively worse. Denies any pain per se, but has been having some discomfort, especially with ambulation. He's been sedantary over the last few weeks. He's also noticed some open wounds in both his legs, which have been draining clear liquid. He has shortness of breath at times. He requires only one pillow to sleep. Denies any symptoms suggestive of PND. Denies any chest pains. No nausea, vomiting, fever or chills. Denies any sick contacts. In the emergency room even minimal exertion will cause his heart rate to go into SVT, which appears to be regular. With these symptoms he denied any form of chest pain.  Hospital Course:   Bilateral pedal edema  Patient was started on diuretics. He has diuresed well. Swelling is improved. Patient remains asymptomatic. Will be discharged on oral agents. Will need close follow up with a provider. He states he will follow up with Dr. Johney Frame. The edema was most likely due to a result of fluid overload as the patient has not been taking his medications. He has known history of ischemic cardiomyopathy. Echo showed Ef  of 45% with DD. No DVT noted on dopplers.  Wheezing  Resolved with inhalers/nebs. CXR did not show any infiltrates. Patient denies cough. He was a heavy smoker till 2 years ago and could have COPD. Started advair.  Sinus tachycardia  Improved with BB. This could have been because of the fact, that he had been off his beta blocker. TSH normal. Echocardiogram results mentioned below.    NSVT/PVC's  Patient had a few episodes of NSVT. He had PVC's. Electrolytes were checked closely. ECHO results noted. No need for immediate intervention. Can follow with cards as OP.   Lower extremity ulcers (Venous) Appreciate wound care input. Compression therapy being utilized. Follow up with OP wound care clinic. Information provided to patient.  history of CAD  Appears to be stable. Continue with aspirin, Plavix beta blockers.   History of hypertension  BP is on the lower side. Will hold off on cardizem for now. Leave on BB. Medications can be adjusted further as OP.  Possible CKD  He had mild increase in creat due to diuresis. Can have renal function checked as OP.  Overall patient has improved. He is keen on going home. He is stable for discharge. Will need close follow up with Dr. Johney Frame and Wound care  PERTINENT LABS Creat was 1.38 at admission and is 1.77 today. Hgb is 11.8.  2D ECHO  Left ventricle: The cavity size was normal. Wall thickness was normal. Systolic function was mildly reduced. The estimated ejection fraction was in the range of 45% to 50%. Diffuse hypokinesis. Doppler parameters are consistent with abnormal  left ventricular relaxation (grade 1 diastolic dysfunction).   IMAGING STUDIES Dg Chest 2 View  06/16/2011  *RADIOLOGY REPORT*  Clinical Data: Shortness of breath.  CHEST - 2 VIEW  Comparison: PA and lateral chest 11/07/2008.  Findings: There is mild cardiomegaly but no pulmonary edema.  Lungs are clear.  No pneumothorax or pleural fluid.  The patient has some degenerative  disease about the right shoulder.  IMPRESSION: Mild cardiomegaly without acute disease.  Original Report Authenticated By: Bernadene Bell. Maricela Curet, M.D.    Discharge Exam: Blood pressure 96/60, pulse 73, temperature 97.7 F (36.5 C), temperature source Oral, resp. rate 20, height 5\' 10"  (1.778 m), weight 156.31 kg (344 lb 9.6 oz), SpO2 92.00%. General appearance: alert, cooperative, appears stated age and morbidly obese Head: Normocephalic, without obvious abnormality, atraumatic Eyes: conjunctivae/corneas clear. PERRL, EOM's intact.  Resp: clear to auscultation bilaterally Cardio: regular rate and rhythm, S1, S2 normal, no murmur, click, rub or gallop GI: soft, non-tender; bowel sounds normal; no masses,  no organomegaly Extremities: edema 2+ Neurologic: Grossly normal  Disposition:   Discharge Orders    Future Orders Please Complete By Expires   Diet - low sodium heart healthy      Increase activity slowly      Discharge instructions      Comments:   Be sure to follow up with Dr. Johney Frame and the wound care center   Discharge wound care:      Comments:   Need the compression dressing changed twice a week at the wound care center     Current Discharge Medication List    START taking these medications   Details  Fluticasone-Salmeterol (ADVAIR) 250-50 MCG/DOSE AEPB Inhale 1 puff into the lungs 2 (two) times daily. Qty: 60 each, Refills: 0      CONTINUE these medications which have CHANGED   Details  aspirin 325 MG tablet Take 1 tablet (325 mg total) by mouth daily. Qty: 30 tablet, Refills: 2    carvedilol (COREG) 25 MG tablet Take 1 tablet (25 mg total) by mouth 2 (two) times daily with a meal. Qty: 60 tablet, Refills: 2    clopidogrel (PLAVIX) 75 MG tablet Take 1 tablet (75 mg total) by mouth daily. Qty: 30 tablet, Refills: 2    furosemide (LASIX) 40 MG tablet Take 1 tablet (40 mg total) by mouth 2 (two) times daily. Qty: 60 tablet, Refills: 0    lisinopril  (PRINIVIL,ZESTRIL) 20 MG tablet Take 1 tablet (20 mg total) by mouth daily. Qty: 30 tablet, Refills: 0    nitroGLYCERIN (NITROSTAT) 0.4 MG SL tablet Place 1 tablet (0.4 mg total) under the tongue every 5 (five) minutes as needed. Qty: 20 tablet, Refills: 0    potassium chloride SA (K-DUR,KLOR-CON) 20 MEQ tablet Take 1 tablet (20 mEq total) by mouth daily. Qty: 30 tablet, Refills: 2    rosuvastatin (CRESTOR) 40 MG tablet Take 1 tablet (40 mg total) by mouth daily. Qty: 30 tablet, Refills: 2      STOP taking these medications     diltiazem (CARDIZEM CD) 240 MG 24 hr capsule        Follow-up Information    Schedule an appointment as soon as possible for a visit with [facility].   Contact information:   [address], [address] [place] [address] [phone]      Follow up with Hillis Range, MD. Schedule an appointment as soon as possible for a visit in 1 week. (post hospitalization follow  up)    Contact information:   [address], [address] [place] [address] [phone]          Total Discharge Time: 35 mins  [facility]  [facility] Pager [phone]  06/19/2011, 12:23 PM

## 2011-06-28 ENCOUNTER — Encounter (HOSPITAL_BASED_OUTPATIENT_CLINIC_OR_DEPARTMENT_OTHER): Payer: Federal, State, Local not specified - PPO | Attending: General Surgery

## 2011-06-28 DIAGNOSIS — Z7902 Long term (current) use of antithrombotics/antiplatelets: Secondary | ICD-10-CM | POA: Insufficient documentation

## 2011-06-28 DIAGNOSIS — L97909 Non-pressure chronic ulcer of unspecified part of unspecified lower leg with unspecified severity: Secondary | ICD-10-CM | POA: Insufficient documentation

## 2011-06-28 DIAGNOSIS — I872 Venous insufficiency (chronic) (peripheral): Secondary | ICD-10-CM | POA: Insufficient documentation

## 2011-06-28 DIAGNOSIS — Z7982 Long term (current) use of aspirin: Secondary | ICD-10-CM | POA: Insufficient documentation

## 2011-06-28 DIAGNOSIS — I83009 Varicose veins of unspecified lower extremity with ulcer of unspecified site: Secondary | ICD-10-CM | POA: Insufficient documentation

## 2011-06-28 DIAGNOSIS — I2510 Atherosclerotic heart disease of native coronary artery without angina pectoris: Secondary | ICD-10-CM | POA: Insufficient documentation

## 2011-06-28 DIAGNOSIS — J4489 Other specified chronic obstructive pulmonary disease: Secondary | ICD-10-CM | POA: Insufficient documentation

## 2011-06-28 DIAGNOSIS — J449 Chronic obstructive pulmonary disease, unspecified: Secondary | ICD-10-CM | POA: Insufficient documentation

## 2011-06-28 DIAGNOSIS — I10 Essential (primary) hypertension: Secondary | ICD-10-CM | POA: Insufficient documentation

## 2011-06-28 DIAGNOSIS — Z79899 Other long term (current) drug therapy: Secondary | ICD-10-CM | POA: Insufficient documentation

## 2011-06-28 NOTE — H&P (Signed)
Larry [name], Larry [name]           ACCOUNT NO.:  [id number]  MEDICAL RECORD NO.:  [id number]  LOCATION:  FOOT                         FACILITY:  [facility]  PHYSICIAN:  Joanne Gavel, M.D.        DATE OF BIRTH:  09/14/1950  DATE OF ADMISSION:  06/28/2011 DATE OF DISCHARGE:                             HISTORY & PHYSICAL   CHIEF COMPLAINT:  Wounds, both legs.  HISTORY OF PRESENT ILLNESS:  This is a 61 year old male with a history of venostasis, obesity, hypertension, cardiomyopathy, COPD, and recent tachycardia, was sent here by the emergency room physician when he appeared in the emergency room with massively swelling legs and several small draining wounds.  PAST MEDICAL HISTORY:  Significant for the above-mentioned abnormalities.  PAST SURGICAL HISTORY:  Past surgically significant for 2 cardiac stents.  SOCIAL HISTORY:  Cigarettes 0 x3 years but he was a smoker before that. Alcohol, no.  ALLERGIES:  None.  MEDICATIONS:  Advair, aspirin, Coreg, Plavix, Lasix, lisinopril, potassium, and Crestor.  REVIEW OF SYSTEMS:  Essentially as above.  PHYSICAL EXAMINATION:  GENERAL: Awake, alert, obese, in no distress. VITAL SIGNS: Temp is 98.3, pulse 83, respirations 18, blood pressure 130/85.  EYES, EARS, NOSE, AND THROAT: Normal. CHEST: Clear. HEART: Regular rhythm. ABDOMEN: Not examined. EXTREMITIES: Examination of extremities reveals trace pitting edema in both legs, multiple areas of dryness.  On each leg, there is a small superficial wound that is slightly draining.  There is some evidence of venostasis.  The patient's wife states that since the emergency room visit, the edema is essentially gone.  PLAN OF TREATMENT:  I have ordered arterial studies.  Venous studies are already present and reveal no fixable abnormalities.  In the mean time, we will treat him with Unna boot and have him come in 3 days for change in dressings.  I will see him in 7 days.     Joanne Gavel,  M.D.     RA/MEDQ  D:  06/28/2011  T:  06/28/2011  Job:  [id number]

## 2011-07-01 ENCOUNTER — Ambulatory Visit (INDEPENDENT_AMBULATORY_CARE_PROVIDER_SITE_OTHER): Payer: Federal, State, Local not specified - PPO | Admitting: *Deleted

## 2011-07-01 DIAGNOSIS — L97909 Non-pressure chronic ulcer of unspecified part of unspecified lower leg with unspecified severity: Secondary | ICD-10-CM

## 2011-07-04 ENCOUNTER — Ambulatory Visit (INDEPENDENT_AMBULATORY_CARE_PROVIDER_SITE_OTHER): Payer: Federal, State, Local not specified - PPO | Admitting: Nurse Practitioner

## 2011-07-04 ENCOUNTER — Encounter: Payer: Self-pay | Admitting: Nurse Practitioner

## 2011-07-04 VITALS — BP 118/70 | HR 84 | Ht 70.5 in | Wt 340.0 lb

## 2011-07-04 DIAGNOSIS — I5030 Unspecified diastolic (congestive) heart failure: Secondary | ICD-10-CM

## 2011-07-04 DIAGNOSIS — I2589 Other forms of chronic ischemic heart disease: Secondary | ICD-10-CM

## 2011-07-04 DIAGNOSIS — R000 Tachycardia, unspecified: Secondary | ICD-10-CM

## 2011-07-04 DIAGNOSIS — E785 Hyperlipidemia, unspecified: Secondary | ICD-10-CM

## 2011-07-04 LAB — LIPID PANEL
Cholesterol: 98 mg/dL (ref 0–200)
HDL: 39.2 mg/dL (ref >39.00)
LDL Cholesterol: 47 mg/dL (ref 0–99)
Total CHOL/HDL Ratio: 3
Triglycerides: 58 mg/dL (ref 0.0–149.0)
VLDL: 11.6 mg/dL (ref 0.0–40.0)

## 2011-07-04 LAB — BASIC METABOLIC PANEL
BUN: 22 mg/dL (ref 6–23)
CO2: 22 mEq/L (ref 19–32)
Calcium: 8.9 mg/dL (ref 8.4–10.5)
Chloride: 102 mEq/L (ref 96–112)
Creatinine, Ser: 1.3 mg/dL (ref 0.4–1.5)
GFR: 70.32 mL/min (ref >60.00)
Glucose, Bld: 94 mg/dL (ref 70–99)
Potassium: 3.8 mEq/L (ref 3.5–5.1)
Sodium: 138 mEq/L (ref 135–145)

## 2011-07-04 MED ORDER — FUROSEMIDE 40 MG PO TABS: 40.0000 mg | ORAL_TABLET | Freq: Two times a day (BID) | ORAL | Status: DC

## 2011-07-04 MED ORDER — LISINOPRIL 20 MG PO TABS: 20.0000 mg | ORAL_TABLET | Freq: Every day | ORAL | Status: DC

## 2011-07-04 NOTE — Assessment & Plan Note (Signed)
Patient presents with acute episode due to medication noncompliance. He is now back on his medical regimen and has improved clinically. We are rechecking a BMET today. I will see him back in one month. Salt restriction and daily weights are encouraged. Patient is agreeable to this plan and will call if any problems develop in the interim.

## 2011-07-04 NOTE — Assessment & Plan Note (Signed)
He had had some reported NSVT while hospitalized. His beta blocker has been restarted. Will continue with his current regimen for now.

## 2011-07-04 NOTE — Progress Notes (Signed)
Larry [name] Date of Birth: 06-Aug-1950 Medical Record #[id number]  History of Present Illness: Larry [name] is seen today for a post hospital visit. He is seen for Dr. Johney Frame. He has an ischemic cardiomyopathy, diastolic dysfunction. HTN, HLD, and obesity. Has had history of atrial tachycardia that had been controlled on beta blocker therapy. He was admitted towards the end of April with swelling in his legs and open wounds. Had ran out of his medicines several months ago and did not have the money to refill. He remains unemployed. Showed up in heart failure. He was diuresed. His wounds were treated. His home medicines were restarted. He did have some NSVT noted, but had his beta blocker restarted. His EF is 45 to 50% and he had diastolic dysfunction.  He comes in today. He is here alone. Says he is much better. He is having hyperbaric therapy for his legs. They are still wrapped. He notes that his swelling has improved and overall he feels better. He says he is not going to stop his medicines again.  No palpitations. Not dizzy or lightheaded. He is trying to establish primary care with his wife's physician - Dr. Daphine Deutscher with [facility].   Current Outpatient Prescriptions on File Prior to Visit  Medication Sig Dispense Refill  . aspirin 325 MG tablet Take 1 tablet (325 mg total) by mouth daily.  30 tablet  2  . carvedilol (COREG) 25 MG tablet Take 1 tablet (25 mg total) by mouth 2 (two) times daily with a meal.  60 tablet  2  . clopidogrel (PLAVIX) 75 MG tablet Take 1 tablet (75 mg total) by mouth daily.  30 tablet  2  . Fluticasone-Salmeterol (ADVAIR) 250-50 MCG/DOSE AEPB Inhale 1 puff into the lungs 2 (two) times daily.  60 each  0  . nitroGLYCERIN (NITROSTAT) 0.4 MG SL tablet Place 1 tablet (0.4 mg total) under the tongue every 5 (five) minutes as needed.  20 tablet  0  . potassium chloride SA (K-DUR,KLOR-CON) 20 MEQ tablet Take 1 tablet (20 mEq total) by mouth daily.  30 tablet  2    . rosuvastatin (CRESTOR) 40 MG tablet Take 1 tablet (40 mg total) by mouth daily.  30 tablet  2  . DISCONTD: furosemide (LASIX) 40 MG tablet Take 1 tablet (40 mg total) by mouth 2 (two) times daily.  60 tablet  0  . DISCONTD: lisinopril (PRINIVIL,ZESTRIL) 20 MG tablet Take 1 tablet (20 mg total) by mouth daily.  30 tablet  0    No Known Allergies  Past Medical History  Diagnosis Date  . Hypertension   . Hypercholesteremia   . CHF (congestive heart failure)     has diastolic heart failure grade 1; EF is 45 to 50% per echo 05/2011  . Coronary artery disease   . COPD (chronic obstructive pulmonary disease)   . Asthma   . Obesity   . Noncompliance   . NSVT (nonsustained ventricular tachycardia)     beta blocker restarted    Past Surgical History  Procedure Date  . Coronary stent placement     x2  . Cardiac catheterization 11/12/2008    stent x 2 to RCA    History  Smoking status  . Former Smoker  . Types: Cigarettes  . Quit date: 07/15/2008  Smokeless tobacco  . Never Used    History  Alcohol Use No    not since New Year's    Family History  Problem Relation Age of  Onset  . Hypertension Father   . Heart disease Father   . Diabetes Father   . Hypertension Mother     Review of Systems: The review of systems is per the HPI.  All other systems were reviewed and are negative.  Physical Exam: BP 118/70  Pulse 84  Ht 5' 10.5" (1.791 m)  Wt 340 lb (154.223 kg)  BMI 48.10 kg/m2 Patient is very pleasant and in no acute distress. He is obese. Skin is warm and dry. Color is normal.  HEENT is unremarkable. Normocephalic/atraumatic. PERRL. Sclera are nonicteric. Neck is supple. No masses. No JVD. Lungs are clear. Cardiac exam shows a regular rate and rhythm. Abdomen is soft. Extremities are wrapped and full. Gait and ROM are intact. No gross neurologic deficits noted.   LABORATORY DATA: BMET is pending.   Echo Study Conclusions April 2013  Left ventricle: The cavity  size was normal. Wall thickness was normal. Systolic function was mildly reduced. The estimated ejection fraction was in the range of 45% to 50%. Diffuse hypokinesis. Doppler parameters are consistent with abnormal left ventricular relaxation (grade 1 diastolic dysfunction).   Assessment / Plan:

## 2011-07-04 NOTE — Patient Instructions (Signed)
Stay on your current medicines  I would like to see you in a month  Avoid salt. Keep weighing each morning.  We are rechecking your potassium level today  Call the [facility] office at [phone] if you have any questions, problems or concerns.

## 2011-07-26 ENCOUNTER — Encounter (HOSPITAL_BASED_OUTPATIENT_CLINIC_OR_DEPARTMENT_OTHER): Payer: Federal, State, Local not specified - PPO | Attending: General Surgery

## 2011-07-26 DIAGNOSIS — Z7982 Long term (current) use of aspirin: Secondary | ICD-10-CM | POA: Insufficient documentation

## 2011-07-26 DIAGNOSIS — J4489 Other specified chronic obstructive pulmonary disease: Secondary | ICD-10-CM | POA: Insufficient documentation

## 2011-07-26 DIAGNOSIS — I83009 Varicose veins of unspecified lower extremity with ulcer of unspecified site: Secondary | ICD-10-CM | POA: Insufficient documentation

## 2011-07-26 DIAGNOSIS — I10 Essential (primary) hypertension: Secondary | ICD-10-CM | POA: Insufficient documentation

## 2011-07-26 DIAGNOSIS — Z7902 Long term (current) use of antithrombotics/antiplatelets: Secondary | ICD-10-CM | POA: Insufficient documentation

## 2011-07-26 DIAGNOSIS — I872 Venous insufficiency (chronic) (peripheral): Secondary | ICD-10-CM | POA: Insufficient documentation

## 2011-07-26 DIAGNOSIS — J449 Chronic obstructive pulmonary disease, unspecified: Secondary | ICD-10-CM | POA: Insufficient documentation

## 2011-07-26 DIAGNOSIS — I2510 Atherosclerotic heart disease of native coronary artery without angina pectoris: Secondary | ICD-10-CM | POA: Insufficient documentation

## 2011-07-26 DIAGNOSIS — L97909 Non-pressure chronic ulcer of unspecified part of unspecified lower leg with unspecified severity: Secondary | ICD-10-CM | POA: Insufficient documentation

## 2011-07-26 DIAGNOSIS — Z79899 Other long term (current) drug therapy: Secondary | ICD-10-CM | POA: Insufficient documentation

## 2011-08-03 ENCOUNTER — Encounter: Payer: Self-pay | Admitting: *Deleted

## 2011-08-04 ENCOUNTER — Ambulatory Visit: Payer: Federal, State, Local not specified - PPO | Admitting: Nurse Practitioner

## 2011-08-04 ENCOUNTER — Other Ambulatory Visit (HOSPITAL_COMMUNITY): Payer: Self-pay | Admitting: *Deleted

## 2011-08-04 MED ORDER — LISINOPRIL 20 MG PO TABS: 20.0000 mg | ORAL_TABLET | Freq: Every day | ORAL | Status: DC

## 2011-08-04 NOTE — Telephone Encounter (Signed)
Refilled lisinopril

## 2011-08-08 ENCOUNTER — Other Ambulatory Visit: Payer: Self-pay

## 2011-08-08 MED ORDER — LISINOPRIL 20 MG PO TABS: 20.0000 mg | ORAL_TABLET | Freq: Every day | ORAL | Status: DC

## 2011-10-11 ENCOUNTER — Other Ambulatory Visit: Payer: Self-pay | Admitting: Internal Medicine

## 2011-10-12 ENCOUNTER — Telehealth: Payer: Self-pay | Admitting: *Deleted

## 2011-10-12 NOTE — Telephone Encounter (Signed)
Talked with pt regarding his diltiazem, didn't see med on list but assured me still taking I will send his script in

## 2012-02-03 ENCOUNTER — Other Ambulatory Visit: Payer: Self-pay | Admitting: *Deleted

## 2012-02-03 MED ORDER — LISINOPRIL 20 MG PO TABS: 20.0000 mg | ORAL_TABLET | Freq: Every day | ORAL | Status: DC

## 2012-02-07 ENCOUNTER — Other Ambulatory Visit: Payer: Self-pay | Admitting: Internal Medicine

## 2012-02-07 MED ORDER — ROSUVASTATIN CALCIUM 40 MG PO TABS: 40.0000 mg | ORAL_TABLET | Freq: Every day | ORAL | Status: DC

## 2012-02-07 MED ORDER — CARVEDILOL 25 MG PO TABS: 25.0000 mg | ORAL_TABLET | Freq: Two times a day (BID) | ORAL | Status: DC

## 2012-02-07 MED ORDER — POTASSIUM CHLORIDE CRYS ER 20 MEQ PO TBCR: 20.0000 meq | EXTENDED_RELEASE_TABLET | Freq: Every day | ORAL | Status: DC

## 2012-02-07 MED ORDER — CLOPIDOGREL BISULFATE 75 MG PO TABS: 75.0000 mg | ORAL_TABLET | Freq: Every day | ORAL | Status: DC

## 2012-05-18 ENCOUNTER — Other Ambulatory Visit: Payer: Self-pay | Admitting: Emergency Medicine

## 2012-05-18 MED ORDER — CLOPIDOGREL BISULFATE 75 MG PO TABS: 75.0000 mg | ORAL_TABLET | Freq: Every day | ORAL | Status: DC

## 2012-05-18 MED ORDER — POTASSIUM CHLORIDE CRYS ER 20 MEQ PO TBCR: 20.0000 meq | EXTENDED_RELEASE_TABLET | Freq: Every day | ORAL | Status: DC

## 2012-05-18 MED ORDER — ROSUVASTATIN CALCIUM 40 MG PO TABS: 40.0000 mg | ORAL_TABLET | Freq: Every day | ORAL | Status: DC

## 2012-07-11 ENCOUNTER — Other Ambulatory Visit: Payer: Self-pay | Admitting: *Deleted

## 2012-07-11 MED ORDER — CARVEDILOL 25 MG PO TABS: 25.0000 mg | ORAL_TABLET | Freq: Two times a day (BID) | ORAL | Status: DC

## 2012-09-09 ENCOUNTER — Other Ambulatory Visit: Payer: Self-pay | Admitting: Internal Medicine

## 2012-09-10 NOTE — Telephone Encounter (Signed)
Pt hasnt seen Allred in 3 years and 1 year for Gerhardt. Wanted to let you make the decision on refill. Thanks.

## 2012-09-11 ENCOUNTER — Other Ambulatory Visit: Payer: Self-pay | Admitting: Internal Medicine

## 2012-09-11 NOTE — Telephone Encounter (Signed)
Give patient #30 w one refill and tell him he needs OV or we can not continue to fill

## 2012-09-11 NOTE — Telephone Encounter (Signed)
Ok to fill  #30 with 1 refill and call pt and let him know he needs an Ov or we can not continue to fill

## 2012-09-11 NOTE — Telephone Encounter (Signed)
See refill note from 09/09/12.

## 2012-09-11 NOTE — Telephone Encounter (Signed)
Called pt to inform him that I would send his Plavix, Crestor and KLOR-CON to [facility] with only one refill each because he needs an appt. for further refills. Unable to reach at home, work number is disconnected so I LMOV of cell. Refills sent.

## 2012-09-11 NOTE — Telephone Encounter (Signed)
Pt has not seen Allred in 3 years and Lori in 14 months. Please review and make decision on refill.

## 2012-09-12 ENCOUNTER — Other Ambulatory Visit: Payer: Self-pay | Admitting: Internal Medicine

## 2012-10-24 ENCOUNTER — Other Ambulatory Visit: Payer: Self-pay | Admitting: Internal Medicine

## 2012-10-29 ENCOUNTER — Other Ambulatory Visit: Payer: Self-pay | Admitting: Internal Medicine

## 2012-11-03 ENCOUNTER — Other Ambulatory Visit: Payer: Self-pay | Admitting: Internal Medicine

## 2012-11-18 ENCOUNTER — Other Ambulatory Visit: Payer: Self-pay | Admitting: Internal Medicine

## 2012-11-19 ENCOUNTER — Other Ambulatory Visit: Payer: Self-pay | Admitting: Internal Medicine

## 2012-12-06 ENCOUNTER — Encounter: Payer: Self-pay | Admitting: Nurse Practitioner

## 2012-12-06 ENCOUNTER — Ambulatory Visit (INDEPENDENT_AMBULATORY_CARE_PROVIDER_SITE_OTHER): Payer: Medicare Other | Admitting: Nurse Practitioner

## 2012-12-06 VITALS — BP 120/82 | HR 80 | Ht 70.5 in | Wt 326.0 lb

## 2012-12-06 DIAGNOSIS — I255 Ischemic cardiomyopathy: Secondary | ICD-10-CM

## 2012-12-06 DIAGNOSIS — I2589 Other forms of chronic ischemic heart disease: Secondary | ICD-10-CM | POA: Diagnosis not present

## 2012-12-06 DIAGNOSIS — I5041 Acute combined systolic (congestive) and diastolic (congestive) heart failure: Secondary | ICD-10-CM | POA: Diagnosis not present

## 2012-12-06 LAB — HEPATIC FUNCTION PANEL
ALT: 11 U/L (ref 0–53)
AST: 14 U/L (ref 0–37)
Albumin: 3.3 g/dL — ABNORMAL LOW (ref 3.5–5.2)
Alkaline Phosphatase: 86 U/L (ref 39–117)
Bilirubin, Direct: 0.2 mg/dL (ref 0.0–0.3)
Total Bilirubin: 1.2 mg/dL (ref 0.3–1.2)
Total Protein: 7.2 g/dL (ref 6.0–8.3)

## 2012-12-06 LAB — CBC WITH DIFFERENTIAL/PLATELET
Basophils Absolute: 0 10*3/uL (ref 0.0–0.1)
Basophils Relative: 0.3 % (ref 0.0–3.0)
Eosinophils Absolute: 0.3 10*3/uL (ref 0.0–0.7)
Eosinophils Relative: 4.9 % (ref 0.0–5.0)
HCT: 29.6 % — ABNORMAL LOW (ref 39.0–52.0)
Hemoglobin: 9.6 g/dL — ABNORMAL LOW (ref 13.0–17.0)
Lymphocytes Relative: 17.4 % (ref 12.0–46.0)
Lymphs Abs: 1.2 10*3/uL (ref 0.7–4.0)
MCHC: 32.3 g/dL (ref 30.0–36.0)
MCV: 80.7 fl (ref 78.0–100.0)
Monocytes Absolute: 0.5 10*3/uL (ref 0.1–1.0)
Monocytes Relative: 7.4 % (ref 3.0–12.0)
Neutro Abs: 4.7 10*3/uL (ref 1.4–7.7)
Neutrophils Relative %: 70 % (ref 43.0–77.0)
Platelets: 239 10*3/uL (ref 150.0–400.0)
RBC: 3.67 Mil/uL — ABNORMAL LOW (ref 4.22–5.81)
RDW: 19.3 % — ABNORMAL HIGH (ref 11.5–14.6)
WBC: 6.7 10*3/uL (ref 4.5–10.5)

## 2012-12-06 LAB — LIPID PANEL
Cholesterol: 130 mg/dL (ref 0–200)
HDL: 41.3 mg/dL (ref >39.00)
LDL Cholesterol: 73 mg/dL (ref 0–99)
Total CHOL/HDL Ratio: 3
Triglycerides: 77 mg/dL (ref 0.0–149.0)
VLDL: 15.4 mg/dL (ref 0.0–40.0)

## 2012-12-06 LAB — BASIC METABOLIC PANEL
BUN: 11 mg/dL (ref 6–23)
CO2: 28 mEq/L (ref 19–32)
Calcium: 9.4 mg/dL (ref 8.4–10.5)
Chloride: 103 mEq/L (ref 96–112)
Creatinine, Ser: 1.3 mg/dL (ref 0.4–1.5)
GFR: 71.83 mL/min (ref >60.00)
Glucose, Bld: 89 mg/dL (ref 70–99)
Potassium: 3.2 mEq/L — ABNORMAL LOW (ref 3.5–5.1)
Sodium: 141 mEq/L (ref 135–145)

## 2012-12-06 LAB — TSH: TSH: 0.7 u[IU]/mL (ref 0.35–5.50)

## 2012-12-06 MED ORDER — LISINOPRIL 20 MG PO TABS: 20.0000 mg | ORAL_TABLET | Freq: Every day | ORAL | Status: DC

## 2012-12-06 MED ORDER — POTASSIUM CHLORIDE CRYS ER 20 MEQ PO TBCR: EXTENDED_RELEASE_TABLET | ORAL | Status: DC

## 2012-12-06 MED ORDER — CLOPIDOGREL BISULFATE 75 MG PO TABS: ORAL_TABLET | ORAL | Status: DC

## 2012-12-06 MED ORDER — DILTIAZEM HCL ER COATED BEADS 240 MG PO CP24: ORAL_CAPSULE | ORAL | Status: DC

## 2012-12-06 MED ORDER — LEVALBUTEROL TARTRATE 45 MCG/ACT IN AERO
1.0000 | INHALATION_SPRAY | RESPIRATORY_TRACT | Status: DC | PRN
Start: 2012-12-06 — End: 2013-02-02

## 2012-12-06 MED ORDER — ROSUVASTATIN CALCIUM 40 MG PO TABS: ORAL_TABLET | ORAL | Status: DC

## 2012-12-06 MED ORDER — CARVEDILOL 25 MG PO TABS: ORAL_TABLET | ORAL | Status: DC

## 2012-12-06 MED ORDER — FUROSEMIDE 40 MG PO TABS: 40.0000 mg | ORAL_TABLET | Freq: Two times a day (BID) | ORAL | Status: DC

## 2012-12-06 MED ORDER — NITROGLYCERIN 0.4 MG SL SUBL: 0.4000 mg | SUBLINGUAL_TABLET | SUBLINGUAL | Status: DC | PRN

## 2012-12-06 NOTE — Patient Instructions (Signed)
I have refilled your medicines  We will check labs today  Will try to get you to a primary care doctor over at [place] office (across from [facility])  I will see you in 6 months.  Call the [facility] office at [phone] if you have any questions, problems or concerns.

## 2012-12-06 NOTE — Progress Notes (Signed)
Larry [name] Date of Birth: December 10, 1950 Medical Record #[id number]  History of Present Illness: Larry [name] is seen back today for a follow up visit. Has not been here since May of 2013. Seen for Dr. Johney Frame. He has an ischemic CM, diastolic dysfunction, HTN, HLD and obesity. Has had a history of atrial tach that has been treated with beta blocker. Was hospitalized back in April of 2013 with a HF exacerbation - had stopped his medicines. Last EF of 45 to 50% with diastolic dysfunction.   Last seen in May of 2013 - has not been seen since.   Comes back today. Out of his medicines again - but says this just happened in the last day or so. Feels pretty good. Has lost weight - has stopped eating after 8pm but otherwise was not really trying. Not short of breath. Not exercising. No chest pain. Not dizzy or lightheaded. No syncope. No palpitations. Seems to have done a better job in regards to taking his medicines. Still with no PCP. Not much swelling.   Current Outpatient Prescriptions  Medication Sig Dispense Refill  . aspirin 325 MG tablet Take 1 tablet (325 mg total) by mouth daily.  30 tablet  2  . carvedilol (COREG) 25 MG tablet TAKE 1 TABLET (25 MG TOTAL) BY MOUTH 2 (TWO) TIMES DAILY WITH A MEAL.  60 tablet  1  . clopidogrel (PLAVIX) 75 MG tablet TAKE 1 TABLET (75 MG TOTAL) BY MOUTH DAILY.  30 tablet  1  . CRESTOR 40 MG tablet TAKE 1 TABLET (40 MG TOTAL) BY MOUTH DAILY.  30 tablet  1  . diltiazem (CARDIZEM CD) 240 MG 24 hr capsule TAKE ONE CAPSULE BY MOUTH EVERY DAY  30 capsule  2  . KLOR-CON M20 20 MEQ tablet TAKE 1 TABLET (20 MEQ TOTAL) BY MOUTH DAILY.  30 tablet  1  . lisinopril (PRINIVIL,ZESTRIL) 20 MG tablet Take 1 tablet (20 mg total) by mouth daily.  90 tablet  2  . nitroGLYCERIN (NITROSTAT) 0.4 MG SL tablet Place 1 tablet (0.4 mg total) under the tongue every 5 (five) minutes as needed.  20 tablet  0  . furosemide (LASIX) 40 MG tablet Take 1 tablet (40 mg total) by mouth 2  (two) times daily.  60 tablet  11   No current facility-administered medications for this visit.    No Known Allergies  Past Medical History  Diagnosis Date  . Hypertension   . Hypercholesteremia   . CHF (congestive heart failure)     has diastolic heart failure grade 1; EF is 45 to 50% per echo 05/2011  . Coronary artery disease   . COPD (chronic obstructive pulmonary disease)   . Asthma   . Obesity   . Noncompliance   . NSVT (nonsustained ventricular tachycardia)     beta blocker restarted  . Atrial tachycardia     managed on beta blocker therapy    Past Surgical History  Procedure Laterality Date  . Coronary stent placement      x2  . Cardiac catheterization  11/12/2008    stent x 2 to RCA    History  Smoking status  . Former Smoker  . Types: Cigarettes  . Quit date: 07/15/2008  Smokeless tobacco  . Never Used    History  Alcohol Use No    Comment: not since New Year's    Family History  Problem Relation Age of Onset  . Hypertension Father   . Heart disease Father   .  Diabetes Father   . Hypertension Mother     Review of Systems: The review of systems is per the HPI.  All other systems were reviewed and are negative.  Physical Exam: BP 120/82  Pulse 80  Ht 5' 10.5" (1.791 m)  Wt 326 lb (147.873 kg)  BMI 46.1 kg/m2 Patient is very pleasant and in no acute distress. Skin is warm and dry. Color is normal.  HEENT is unremarkable. Normocephalic/atraumatic. PERRL. Sclera are nonicteric. Neck is supple. No masses. No JVD. Lungs are clear. Cardiac exam shows a regular rate and rhythm. Abdomen is soft. Extremities are with trace edema. Legs are very dry. Gait and ROM are intact. No gross neurologic deficits noted.  LABORATORY DATA: PENDING  EKG today shows sinus tach with PVCs  Lab Results  Component Value Date   WBC 5.2 06/19/2011   HGB 11.8* 06/19/2011   HCT 37.7* 06/19/2011   PLT 180 06/19/2011   GLUCOSE 94 07/04/2011   CHOL 98 07/04/2011   TRIG 58.0  07/04/2011   HDL 39.20 07/04/2011   LDLCALC 47 07/04/2011   ALT 9 06/17/2011   AST 10 06/17/2011   NA 138 07/04/2011   K 3.8 07/04/2011   CL 102 07/04/2011   CREATININE 1.3 07/04/2011   BUN 22 07/04/2011   CO2 22 07/04/2011   TSH 1.018 06/16/2011   INR 0.93 06/16/2011   HGBA1C 6.0 11/26/2009   Echo Study Conclusions from April 2013  Left ventricle: The cavity size was normal. Wall thickness was normal. Systolic function was mildly reduced. The estimated ejection fraction was in the range of 45% to 50%. Diffuse hypokinesis. Doppler parameters are consistent with abnormal left ventricular relaxation (grade 1 diastolic dysfunction).    Assessment / Plan: 1. Ischemic CM - EF of 45 to 50% - doing ok - will refill his medicines today. Update his labs.   2. HTN - BP looks good.  3. Atrial tach - in sinus today.   4. Obesity - has lost some weight.   I have offered to see him back in 6 months. Update his labs today. Refilled his medicines today.   Patient is agreeable to this plan and will call if any problems develop in the interim.   Rosalio Macadamia, RN, ANP-C [facility] [address] [place], [place]  [address]

## 2012-12-10 ENCOUNTER — Other Ambulatory Visit: Payer: Self-pay | Admitting: *Deleted

## 2012-12-10 ENCOUNTER — Ambulatory Visit (INDEPENDENT_AMBULATORY_CARE_PROVIDER_SITE_OTHER): Payer: Medicare Other | Admitting: Internal Medicine

## 2012-12-10 ENCOUNTER — Telehealth: Payer: Self-pay | Admitting: *Deleted

## 2012-12-10 DIAGNOSIS — I498 Other specified cardiac arrhythmias: Secondary | ICD-10-CM | POA: Diagnosis not present

## 2012-12-10 DIAGNOSIS — I10 Essential (primary) hypertension: Secondary | ICD-10-CM

## 2012-12-10 DIAGNOSIS — E785 Hyperlipidemia, unspecified: Secondary | ICD-10-CM | POA: Diagnosis not present

## 2012-12-10 DIAGNOSIS — I2510 Atherosclerotic heart disease of native coronary artery without angina pectoris: Secondary | ICD-10-CM

## 2012-12-10 DIAGNOSIS — D649 Anemia, unspecified: Secondary | ICD-10-CM

## 2012-12-10 LAB — CBC WITH DIFFERENTIAL/PLATELET
Basophils Absolute: 0 10*3/uL (ref 0.0–0.1)
Basophils Relative: 0.4 % (ref 0.0–3.0)
Eosinophils Absolute: 0.3 10*3/uL (ref 0.0–0.7)
Eosinophils Relative: 4.4 % (ref 0.0–5.0)
HCT: 29.5 % — ABNORMAL LOW (ref 39.0–52.0)
Hemoglobin: 9.4 g/dL — ABNORMAL LOW (ref 13.0–17.0)
Lymphocytes Relative: 17.7 % (ref 12.0–46.0)
Lymphs Abs: 1.2 10*3/uL (ref 0.7–4.0)
MCHC: 31.9 g/dL (ref 30.0–36.0)
MCV: 81.2 fl (ref 78.0–100.0)
Monocytes Absolute: 0.6 10*3/uL (ref 0.1–1.0)
Monocytes Relative: 8.7 % (ref 3.0–12.0)
Neutro Abs: 4.5 10*3/uL (ref 1.4–7.7)
Neutrophils Relative %: 68.8 % (ref 43.0–77.0)
Platelets: 243 10*3/uL (ref 150.0–400.0)
RBC: 3.64 Mil/uL — ABNORMAL LOW (ref 4.22–5.81)
RDW: 19.9 % — ABNORMAL HIGH (ref 11.5–14.6)
WBC: 6.6 10*3/uL (ref 4.5–10.5)

## 2012-12-10 LAB — FOLATE: Folate: 2.5 ng/mL — ABNORMAL LOW (ref >5.9)

## 2012-12-10 LAB — VITAMIN B12: Vitamin B-12: 251 pg/mL (ref 211–911)

## 2012-12-10 LAB — IBC PANEL
Iron: 38 ug/dL — ABNORMAL LOW (ref 42–165)
Saturation Ratios: 12.8 % — ABNORMAL LOW (ref 20.0–50.0)
Transferrin: 212.3 mg/dL (ref 212.0–360.0)

## 2012-12-10 LAB — FERRITIN: Ferritin: 21.9 ng/mL — ABNORMAL LOW (ref 22.0–322.0)

## 2012-12-10 NOTE — Telephone Encounter (Signed)
Message copied by Debbe Bales on Mon Dec 10, 2012  9:22 AM ------      Message from: Mariane Masters D      Created: Fri Dec 07, 2012  8:23 AM      Regarding: PRIMARY CARE APPT       12/07/12 Duwayne Heck, The next appointment is with Dr.Tabori on 02/07/13 at 3:00pm.  I keep both appointment until I here back from you and Lori,this patient is not aware.            Thanks ------

## 2012-12-10 NOTE — Telephone Encounter (Signed)
S/w pt is aware of appointment with Dr. De Nurse office on 12/13 @ 3:00 pt agreeable to plan

## 2012-12-14 ENCOUNTER — Encounter: Payer: Self-pay | Admitting: Internal Medicine

## 2012-12-18 ENCOUNTER — Other Ambulatory Visit: Payer: Medicare Other

## 2012-12-18 DIAGNOSIS — D649 Anemia, unspecified: Secondary | ICD-10-CM

## 2012-12-18 LAB — HEMOCCULT SLIDES (X 3 CARDS)
Fecal Occult Blood: POSITIVE
OCCULT 1: POSITIVE
OCCULT 2: POSITIVE
OCCULT 3: POSITIVE
OCCULT 4: POSITIVE
OCCULT 5: POSITIVE

## 2012-12-26 ENCOUNTER — Telehealth: Payer: Self-pay | Admitting: Internal Medicine

## 2012-12-26 ENCOUNTER — Ambulatory Visit: Payer: Federal, State, Local not specified - PPO | Admitting: Internal Medicine

## 2013-02-02 ENCOUNTER — Inpatient Hospital Stay (HOSPITAL_COMMUNITY)
Admission: EM | Admit: 2013-02-02 | Discharge: 2013-03-01 | DRG: 329 | Disposition: A | Discharge status: Rehabilitation Facility | Payer: Medicare Other | Attending: Internal Medicine | Admitting: Internal Medicine

## 2013-02-02 ENCOUNTER — Encounter (HOSPITAL_COMMUNITY): Payer: Self-pay | Admitting: Emergency Medicine

## 2013-02-02 ENCOUNTER — Inpatient Hospital Stay (HOSPITAL_COMMUNITY): Payer: Medicare Other

## 2013-02-02 ENCOUNTER — Emergency Department (HOSPITAL_COMMUNITY): Payer: Medicare Other

## 2013-02-02 DIAGNOSIS — I5043 Acute on chronic combined systolic (congestive) and diastolic (congestive) heart failure: Secondary | ICD-10-CM | POA: Diagnosis present

## 2013-02-02 DIAGNOSIS — Z7982 Long term (current) use of aspirin: Secondary | ICD-10-CM

## 2013-02-02 DIAGNOSIS — K6389 Other specified diseases of intestine: Secondary | ICD-10-CM

## 2013-02-02 DIAGNOSIS — C189 Malignant neoplasm of colon, unspecified: Secondary | ICD-10-CM | POA: Diagnosis not present

## 2013-02-02 DIAGNOSIS — R000 Tachycardia, unspecified: Secondary | ICD-10-CM | POA: Diagnosis not present

## 2013-02-02 DIAGNOSIS — Z7902 Long term (current) use of antithrombotics/antiplatelets: Secondary | ICD-10-CM

## 2013-02-02 DIAGNOSIS — I498 Other specified cardiac arrhythmias: Secondary | ICD-10-CM | POA: Diagnosis not present

## 2013-02-02 DIAGNOSIS — I2589 Other forms of chronic ischemic heart disease: Secondary | ICD-10-CM | POA: Diagnosis present

## 2013-02-02 DIAGNOSIS — J449 Chronic obstructive pulmonary disease, unspecified: Secondary | ICD-10-CM | POA: Diagnosis not present

## 2013-02-02 DIAGNOSIS — C19 Malignant neoplasm of rectosigmoid junction: Secondary | ICD-10-CM | POA: Diagnosis not present

## 2013-02-02 DIAGNOSIS — T81329A Deep disruption or dehiscence of operation wound, unspecified, initial encounter: Secondary | ICD-10-CM | POA: Diagnosis not present

## 2013-02-02 DIAGNOSIS — I4721 Torsades de pointes: Secondary | ICD-10-CM

## 2013-02-02 DIAGNOSIS — R0602 Shortness of breath: Secondary | ICD-10-CM

## 2013-02-02 DIAGNOSIS — E875 Hyperkalemia: Secondary | ICD-10-CM | POA: Diagnosis present

## 2013-02-02 DIAGNOSIS — R5381 Other malaise: Secondary | ICD-10-CM | POA: Diagnosis present

## 2013-02-02 DIAGNOSIS — K929 Disease of digestive system, unspecified: Secondary | ICD-10-CM | POA: Diagnosis not present

## 2013-02-02 DIAGNOSIS — I472 Ventricular tachycardia, unspecified: Secondary | ICD-10-CM | POA: Diagnosis not present

## 2013-02-02 DIAGNOSIS — R0989 Other specified symptoms and signs involving the circulatory and respiratory systems: Secondary | ICD-10-CM | POA: Diagnosis not present

## 2013-02-02 DIAGNOSIS — E46 Unspecified protein-calorie malnutrition: Secondary | ICD-10-CM | POA: Diagnosis present

## 2013-02-02 DIAGNOSIS — R109 Unspecified abdominal pain: Secondary | ICD-10-CM | POA: Diagnosis not present

## 2013-02-02 DIAGNOSIS — R195 Other fecal abnormalities: Secondary | ICD-10-CM | POA: Diagnosis not present

## 2013-02-02 DIAGNOSIS — C787 Secondary malignant neoplasm of liver and intrahepatic bile duct: Secondary | ICD-10-CM | POA: Diagnosis present

## 2013-02-02 DIAGNOSIS — K2970 Gastritis, unspecified, without bleeding: Secondary | ICD-10-CM | POA: Diagnosis not present

## 2013-02-02 DIAGNOSIS — G4730 Sleep apnea, unspecified: Secondary | ICD-10-CM

## 2013-02-02 DIAGNOSIS — R609 Edema, unspecified: Secondary | ICD-10-CM | POA: Diagnosis not present

## 2013-02-02 DIAGNOSIS — R404 Transient alteration of awareness: Secondary | ICD-10-CM | POA: Diagnosis not present

## 2013-02-02 DIAGNOSIS — N281 Cyst of kidney, acquired: Secondary | ICD-10-CM | POA: Diagnosis not present

## 2013-02-02 DIAGNOSIS — N189 Chronic kidney disease, unspecified: Secondary | ICD-10-CM | POA: Diagnosis present

## 2013-02-02 DIAGNOSIS — D529 Folate deficiency anemia, unspecified: Secondary | ICD-10-CM | POA: Diagnosis present

## 2013-02-02 DIAGNOSIS — I10 Essential (primary) hypertension: Secondary | ICD-10-CM | POA: Diagnosis present

## 2013-02-02 DIAGNOSIS — D500 Iron deficiency anemia secondary to blood loss (chronic): Secondary | ICD-10-CM | POA: Diagnosis not present

## 2013-02-02 DIAGNOSIS — D649 Anemia, unspecified: Secondary | ICD-10-CM | POA: Diagnosis not present

## 2013-02-02 DIAGNOSIS — Z5189 Encounter for other specified aftercare: Secondary | ICD-10-CM | POA: Diagnosis not present

## 2013-02-02 DIAGNOSIS — K560 Paralytic ileus: Secondary | ICD-10-CM | POA: Diagnosis not present

## 2013-02-02 DIAGNOSIS — E860 Dehydration: Secondary | ICD-10-CM | POA: Diagnosis present

## 2013-02-02 DIAGNOSIS — I129 Hypertensive chronic kidney disease with stage 1 through stage 4 chronic kidney disease, or unspecified chronic kidney disease: Secondary | ICD-10-CM | POA: Diagnosis present

## 2013-02-02 DIAGNOSIS — Z9861 Coronary angioplasty status: Secondary | ICD-10-CM

## 2013-02-02 DIAGNOSIS — I4891 Unspecified atrial fibrillation: Secondary | ICD-10-CM | POA: Diagnosis present

## 2013-02-02 DIAGNOSIS — I2510 Atherosclerotic heart disease of native coronary artery without angina pectoris: Secondary | ICD-10-CM | POA: Diagnosis present

## 2013-02-02 DIAGNOSIS — D638 Anemia in other chronic diseases classified elsewhere: Secondary | ICD-10-CM | POA: Diagnosis present

## 2013-02-02 DIAGNOSIS — Z6841 Body Mass Index (BMI) 40.0 and over, adult: Secondary | ICD-10-CM

## 2013-02-02 DIAGNOSIS — E876 Hypokalemia: Secondary | ICD-10-CM | POA: Diagnosis not present

## 2013-02-02 DIAGNOSIS — R05 Cough: Secondary | ICD-10-CM | POA: Diagnosis not present

## 2013-02-02 DIAGNOSIS — K922 Gastrointestinal hemorrhage, unspecified: Secondary | ICD-10-CM | POA: Diagnosis not present

## 2013-02-02 DIAGNOSIS — Z79899 Other long term (current) drug therapy: Secondary | ICD-10-CM

## 2013-02-02 DIAGNOSIS — E538 Deficiency of other specified B group vitamins: Secondary | ICD-10-CM | POA: Diagnosis present

## 2013-02-02 DIAGNOSIS — I471 Supraventricular tachycardia, unspecified: Secondary | ICD-10-CM | POA: Diagnosis not present

## 2013-02-02 DIAGNOSIS — T8132XA Disruption of internal operation (surgical) wound, not elsewhere classified, initial encounter: Secondary | ICD-10-CM | POA: Diagnosis not present

## 2013-02-02 DIAGNOSIS — K254 Chronic or unspecified gastric ulcer with hemorrhage: Secondary | ICD-10-CM | POA: Diagnosis present

## 2013-02-02 DIAGNOSIS — Z91199 Patient's noncompliance with other medical treatment and regimen due to unspecified reason: Secondary | ICD-10-CM

## 2013-02-02 DIAGNOSIS — D126 Benign neoplasm of colon, unspecified: Secondary | ICD-10-CM | POA: Diagnosis not present

## 2013-02-02 DIAGNOSIS — N179 Acute kidney failure, unspecified: Secondary | ICD-10-CM | POA: Diagnosis not present

## 2013-02-02 DIAGNOSIS — D490 Neoplasm of unspecified behavior of digestive system: Secondary | ICD-10-CM | POA: Diagnosis not present

## 2013-02-02 DIAGNOSIS — I5033 Acute on chronic diastolic (congestive) heart failure: Secondary | ICD-10-CM | POA: Diagnosis not present

## 2013-02-02 DIAGNOSIS — J4489 Other specified chronic obstructive pulmonary disease: Secondary | ICD-10-CM | POA: Diagnosis not present

## 2013-02-02 DIAGNOSIS — I509 Heart failure, unspecified: Secondary | ICD-10-CM | POA: Diagnosis present

## 2013-02-02 DIAGNOSIS — E669 Obesity, unspecified: Secondary | ICD-10-CM | POA: Diagnosis present

## 2013-02-02 DIAGNOSIS — I4729 Other ventricular tachycardia: Secondary | ICD-10-CM | POA: Diagnosis not present

## 2013-02-02 DIAGNOSIS — E785 Hyperlipidemia, unspecified: Secondary | ICD-10-CM | POA: Diagnosis present

## 2013-02-02 DIAGNOSIS — C184 Malignant neoplasm of transverse colon: Secondary | ICD-10-CM | POA: Diagnosis not present

## 2013-02-02 DIAGNOSIS — Z9119 Patient's noncompliance with other medical treatment and regimen: Secondary | ICD-10-CM

## 2013-02-02 DIAGNOSIS — I469 Cardiac arrest, cause unspecified: Secondary | ICD-10-CM

## 2013-02-02 DIAGNOSIS — N19 Unspecified kidney failure: Secondary | ICD-10-CM | POA: Diagnosis not present

## 2013-02-02 DIAGNOSIS — K5900 Constipation, unspecified: Secondary | ICD-10-CM | POA: Diagnosis not present

## 2013-02-02 DIAGNOSIS — Y838 Other surgical procedures as the cause of abnormal reaction of the patient, or of later complication, without mention of misadventure at the time of the procedure: Secondary | ICD-10-CM | POA: Diagnosis not present

## 2013-02-02 DIAGNOSIS — G4733 Obstructive sleep apnea (adult) (pediatric): Secondary | ICD-10-CM | POA: Diagnosis present

## 2013-02-02 DIAGNOSIS — R059 Cough, unspecified: Secondary | ICD-10-CM | POA: Diagnosis not present

## 2013-02-02 DIAGNOSIS — Z87891 Personal history of nicotine dependence: Secondary | ICD-10-CM

## 2013-02-02 DIAGNOSIS — I959 Hypotension, unspecified: Secondary | ICD-10-CM | POA: Diagnosis not present

## 2013-02-02 DIAGNOSIS — I4901 Ventricular fibrillation: Secondary | ICD-10-CM | POA: Diagnosis not present

## 2013-02-02 DIAGNOSIS — E861 Hypovolemia: Secondary | ICD-10-CM | POA: Diagnosis present

## 2013-02-02 DIAGNOSIS — R5383 Other fatigue: Secondary | ICD-10-CM | POA: Diagnosis not present

## 2013-02-02 DIAGNOSIS — K7460 Unspecified cirrhosis of liver: Secondary | ICD-10-CM | POA: Diagnosis present

## 2013-02-02 DIAGNOSIS — I5022 Chronic systolic (congestive) heart failure: Secondary | ICD-10-CM

## 2013-02-02 DIAGNOSIS — N289 Disorder of kidney and ureter, unspecified: Secondary | ICD-10-CM | POA: Diagnosis not present

## 2013-02-02 DIAGNOSIS — I517 Cardiomegaly: Secondary | ICD-10-CM | POA: Diagnosis not present

## 2013-02-02 LAB — CBC WITH DIFFERENTIAL/PLATELET
Basophils Absolute: 0 10*3/uL (ref 0.0–0.1)
Basophils Relative: 0 % (ref 0–1)
Eosinophils Absolute: 0.2 10*3/uL (ref 0.0–0.7)
Eosinophils Relative: 2 % (ref 0–5)
HCT: 23.7 % — ABNORMAL LOW (ref 39.0–52.0)
Hemoglobin: 7.3 g/dL — ABNORMAL LOW (ref 13.0–17.0)
Lymphocytes Relative: 15 % (ref 12–46)
MCH: 26.8 pg (ref 26.0–34.0)
MCHC: 30.8 g/dL (ref 30.0–36.0)
Monocytes Absolute: 0.8 10*3/uL (ref 0.1–1.0)
Monocytes Relative: 9 % (ref 3–12)
Neutro Abs: 6.2 10*3/uL (ref 1.7–7.7)
Neutrophils Relative %: 73 % (ref 43–77)
Platelets: 242 10*3/uL (ref 150–400)
RDW: 20.5 % — ABNORMAL HIGH (ref 11.5–15.5)
WBC: 8.5 10*3/uL (ref 4.0–10.5)

## 2013-02-02 LAB — COMPREHENSIVE METABOLIC PANEL
AST: 8 U/L (ref 0–37)
Albumin: 3.5 g/dL (ref 3.5–5.2)
Alkaline Phosphatase: 111 U/L (ref 39–117)
CO2: 21 mEq/L (ref 19–32)
Chloride: 104 mEq/L (ref 96–112)
Creatinine, Ser: 7.83 mg/dL — ABNORMAL HIGH (ref 0.50–1.35)
GFR calc non Af Amer: 7 mL/min — ABNORMAL LOW (ref >90)
Potassium: 5.6 mEq/L — ABNORMAL HIGH (ref 3.5–5.1)
Total Bilirubin: 0.4 mg/dL (ref 0.3–1.2)

## 2013-02-02 LAB — URINALYSIS, ROUTINE W REFLEX MICROSCOPIC
Bilirubin Urine: NEGATIVE
Glucose, UA: NEGATIVE mg/dL
Hgb urine dipstick: NEGATIVE
Ketones, ur: NEGATIVE mg/dL
Nitrite: NEGATIVE
Specific Gravity, Urine: 1.015 (ref 1.005–1.030)
pH: 5.5 (ref 5.0–8.0)

## 2013-02-02 LAB — ABO/RH: ABO/RH(D): AB NEG

## 2013-02-02 LAB — URINE MICROSCOPIC-ADD ON

## 2013-02-02 LAB — TROPONIN I: Troponin I: <0.3 ng/mL (ref <0.30)

## 2013-02-02 LAB — PREPARE RBC (CROSSMATCH)

## 2013-02-02 MED ORDER — SODIUM CHLORIDE 0.9 % IJ SOLN
3.0000 mL | Freq: Two times a day (BID) | INTRAMUSCULAR | Status: DC
  Administered 2013-02-03 – 2013-02-11 (×15): 3 mL via INTRAVENOUS

## 2013-02-02 MED ORDER — ASPIRIN EC 325 MG PO TBEC: 325.0000 mg | DELAYED_RELEASE_TABLET | Freq: Every day | ORAL | Status: DC

## 2013-02-02 MED ORDER — SODIUM CHLORIDE 0.9 % IV BOLUS (SEPSIS)
1000.0000 mL | Freq: Once | INTRAVENOUS | Status: AC
  Administered 2013-02-02: 1000 mL via INTRAVENOUS

## 2013-02-02 MED ORDER — CLOPIDOGREL BISULFATE 75 MG PO TABS: 75.0000 mg | ORAL_TABLET | Freq: Every day | ORAL | Status: DC

## 2013-02-02 MED ORDER — SODIUM CHLORIDE 0.9 % IV SOLN
INTRAVENOUS | Status: DC
  Administered 2013-02-03: 14:00:00 via INTRAVENOUS
  Administered 2013-02-04: 100 mL/h via INTRAVENOUS
  Administered 2013-02-05: 08:00:00 via INTRAVENOUS

## 2013-02-02 MED ORDER — LEVALBUTEROL TARTRATE 45 MCG/ACT IN AERO
1.0000 | INHALATION_SPRAY | RESPIRATORY_TRACT | Status: DC | PRN
  Filled 2013-02-02: qty 15

## 2013-02-02 NOTE — ED Provider Notes (Signed)
CSN: [id number]     Arrival date & time 02/02/13  1602 History   First MD Initiated Contact with Patient 02/02/13 1606     Chief Complaint  Patient presents with  . Weakness  . Near Syncope   (Consider location/radiation/quality/duration/timing/severity/associated sxs/prior Treatment) HPI 62 year old male who presents to the emergency department by EMS for concern of increasing fatigue. Patient reports that he felt like he may be getting a cold 2 months ago. This is slowly worsening. Patient now reports that he now has no energy to do anything. Spending approximately 22 hours a day in his bed. Decreased eating and drinking. Gets out of bed only to go to the bathroom and then immediately reurns to bed afterwards. Wife finally decided that she should be checked out today. Patient denies cough, congestion, runny nose, fevers, chest pain, abdominal pain, or any other symptoms. Nursing note mentions feeling that he is going to pass out, however he declines this to me.  Past Medical History  Diagnosis Date  . Hypertension   . Hypercholesteremia   . CHF (congestive heart failure)     has diastolic heart failure grade 1; EF is 45 to 50% per echo 05/2011  . Coronary artery disease   . COPD (chronic obstructive pulmonary disease)   . Asthma   . Obesity   . Noncompliance   . NSVT (nonsustained ventricular tachycardia)     beta blocker restarted  . Atrial tachycardia     managed on beta blocker therapy   Past Surgical History  Procedure Laterality Date  . Coronary stent placement      x2  . Cardiac catheterization  11/12/2008    stent x 2 to RCA   Family History  Problem Relation Age of Onset  . Hypertension Father   . Heart disease Father   . Diabetes Father   . Hypertension Mother    History  Substance Use Topics  . Smoking status: Former Smoker    Types: Cigarettes    Quit date: 07/15/2008  . Smokeless tobacco: Never Used  . Alcohol Use: No     Comment: not since New Year's     Review of Systems  Constitutional: Positive for fatigue. Negative for fever and chills.  HENT: Negative for congestion and sore throat.   Respiratory: Negative for cough.   Gastrointestinal: Negative for nausea, vomiting, abdominal pain, diarrhea and constipation.  Endocrine: Negative for polyuria.  Genitourinary: Negative for dysuria and hematuria.  Musculoskeletal: Negative for neck pain.  Skin: Negative for rash.  Neurological: Negative for headaches.  Psychiatric/Behavioral: Negative.   All other systems reviewed and are negative.    Allergies  Review of patient's allergies indicates no known allergies.  Home Medications   Current Outpatient Rx  Name  Route  Sig  Dispense  Refill  . aspirin EC 325 MG tablet   Oral   Take 325 mg by mouth daily.         . clopidogrel (PLAVIX) 75 MG tablet   Oral   Take 75 mg by mouth daily with breakfast.         . diltiazem (CARDIZEM CD) 240 MG 24 hr capsule   Oral   Take 240 mg by mouth daily.         . furosemide (LASIX) 40 MG tablet   Oral   Take 40 mg by mouth 2 (two) times daily.         . rosuvastatin (CRESTOR) 40 MG tablet   Oral  Take 40 mg by mouth daily.         . carvedilol (COREG) 25 MG tablet      TAKE 1 TABLET (25 MG TOTAL) BY MOUTH 2 (TWO) TIMES DAILY WITH A MEAL.   60 tablet   11   . levalbuterol (XOPENEX HFA) 45 MCG/ACT inhaler   Inhalation   Inhale 1-2 puffs into the lungs every 4 (four) hours as needed for wheezing.   1 Inhaler   12   . lisinopril (PRINIVIL,ZESTRIL) 20 MG tablet   Oral   Take 1 tablet (20 mg total) by mouth daily.   90 tablet   2   . nitroGLYCERIN (NITROSTAT) 0.4 MG SL tablet   Sublingual   Place 1 tablet (0.4 mg total) under the tongue every 5 (five) minutes as needed.   25 tablet   6   . potassium chloride SA (KLOR-CON M20) 20 MEQ tablet      TAKE 1 TABLET (20 MEQ TOTAL) BY MOUTH DAILY.   30 tablet   11    BP 80/50  Pulse 101  Temp(Src) 97.9 F (36.6  C) (Oral)  Resp 14  Wt 310 lb (140.615 kg)  SpO2 100% Physical Exam  Nursing note and vitals reviewed. Constitutional: He is oriented to person, place, and time. He appears well-developed and well-nourished. No distress.  HENT:  Head: Normocephalic and atraumatic.  Right Ear: External ear normal.  Left Ear: External ear normal.  Mouth/Throat: Oropharynx is clear and moist. No oropharyngeal exudate.  Eyes: Conjunctivae are normal. Pupils are equal, round, and reactive to light. Right eye exhibits no discharge.  Neck: Normal range of motion. Neck supple. No tracheal deviation present.  Cardiovascular: Normal rate, regular rhythm and intact distal pulses.   Pulmonary/Chest: Effort normal. No respiratory distress. He has no wheezes. He has no rales.  Abdominal: Soft. He exhibits no distension. There is no tenderness. There is no rebound and no guarding.  Musculoskeletal: Normal range of motion.  Neurological: He is alert and oriented to person, place, and time.  Skin: Skin is warm and dry. No rash noted. He is not diaphoretic.  Psychiatric: He has a normal mood and affect.    ED Course  Procedures (including critical care time) Labs Review Labs Reviewed  CBC WITH DIFFERENTIAL - Abnormal; Notable for the following:    RBC 2.72 (*)    Hemoglobin 7.3 (*)    HCT 23.7 (*)    RDW 20.5 (*)    All other components within normal limits  COMPREHENSIVE METABOLIC PANEL - Abnormal; Notable for the following:    Potassium 5.6 (*)    Glucose, Bld 103 (*)    BUN 70 (*)    Creatinine, Ser 7.83 (*)    GFR calc non Af Amer 7 (*)    GFR calc Af Amer 8 (*)    All other components within normal limits  URINALYSIS, ROUTINE W REFLEX MICROSCOPIC - Abnormal; Notable for the following:    APPearance CLOUDY (*)    Protein, ur 100 (*)    All other components within normal limits  POTASSIUM - Abnormal; Notable for the following:    Potassium 5.2 (*)    All other components within normal limits   POTASSIUM - Abnormal; Notable for the following:    Potassium 5.8 (*)    All other components within normal limits  URINE MICROSCOPIC-ADD ON - Abnormal; Notable for the following:    Squamous Epithelial / LPF FEW (*)  Casts HYALINE CASTS (*)    All other components within normal limits  MRSA PCR SCREENING  MRSA PCR SCREENING  LACTIC ACID, PLASMA  TROPONIN I  CBC  BASIC METABOLIC PANEL  POTASSIUM  POTASSIUM  POTASSIUM  POTASSIUM  POTASSIUM  POTASSIUM  HEMOGLOBIN AND HEMATOCRIT, BLOOD  TYPE AND SCREEN  PREPARE RBC (CROSSMATCH)  ABO/RH   Imaging Review No results found.  EKG Interpretation    Date/Time:  Saturday February 02 2013 16:10:56 EST Ventricular Rate:  101 PR Interval:  137 QRS Duration: 104 QT Interval:  354 QTC Calculation: 459 R Axis:   63 Text Interpretation:  Sinus tachycardia Low voltage, extremity leads No significant change since Confirmed by POLLINA  MD, CHRISTOPHER ([id number]) on 02/02/2013 4:15:44 PM            MDM   1. Acute renal failure   2. Chronic GI bleeding   3. Anemia   4. Hypotension     62 year old male with history of coronary artery disease, ischemic cardiomyopathy, and medical noncompliance who presents to the emergency department today with 7-8 weeks of fatigue. On EMS arrival, patient found to be hypotensive with a blood pressure of 90s over 50s, otherwise stable.  Exam indicating general hypovolemia.  Resuscitation initiated with NS.  Labs showing ARF and anemia to 7.  Fecal occult blood positive several times in past.  No melena or blood.  Transfusion initiated.  Pressure starting to improve.  No evidence of infection.  Will hold abx at this point in time.  Patient will require stepdown unit for further observation, testing and treatment.  Hospitalists consulted for admission.  Patient safe for admission.  Patient admitted.   Arloa Koh, MD 02/03/13 [phone]

## 2013-02-02 NOTE — ED Notes (Signed)
Consent for blood obtained from patient. Form placed in patients chart.

## 2013-02-02 NOTE — H&P (Signed)
[facility] History and Physical  HAROUT SCHEURICH UXL:[id number] DOB: 17-Sep-1950 DOA: 02/02/2013  Referring physician: EDP PCP: No primary provider on file.   Chief Complaint: Weakness   HPI: Larry [name] is a 62 y.o. male who presents to the ED after 4 weeks of generalized weakness.  His family member has been trying to convince him to get checked out during that time period but he kept refusing saying he just "had a cold".  He apparently had been sleeping up to 22 hours a day.  Decreased po intake, gets out of bed only to go to bathroom and immediately back to bed after.  In the ED patient found to have creatinine 7.8 (baseline 1.3), he was also hypotensive.  Review of Systems: Systems reviewed.  As above, otherwise negative  Past Medical History  Diagnosis Date  . Hypertension   . Hypercholesteremia   . CHF (congestive heart failure)     has diastolic heart failure grade 1; EF is 45 to 50% per echo 05/2011  . Coronary artery disease   . COPD (chronic obstructive pulmonary disease)   . Asthma   . Obesity   . Noncompliance   . NSVT (nonsustained ventricular tachycardia)     beta blocker restarted  . Atrial tachycardia     managed on beta blocker therapy   Past Surgical History  Procedure Laterality Date  . Coronary stent placement      x2  . Cardiac catheterization  11/12/2008    stent x 2 to RCA   Social History:  reports that he quit smoking about 4 years ago. His smoking use included Cigarettes. He smoked 0.00 packs per day. He has never used smokeless tobacco. He reports that he does not drink alcohol or use illicit drugs.  No Known Allergies  Family History  Problem Relation Age of Onset  . Hypertension Father   . Heart disease Father   . Diabetes Father   . Hypertension Mother      Prior to Admission medications   Medication Sig Start Date End Date Taking? Authorizing Provider  aspirin EC 325 MG tablet Take 325 mg by mouth daily.   Yes  Historical Provider, MD  carvedilol (COREG) 25 MG tablet Take 25 mg by mouth daily.   Yes Historical Provider, MD  clopidogrel (PLAVIX) 75 MG tablet Take 75 mg by mouth daily with breakfast.   Yes Historical Provider, MD  diltiazem (CARDIZEM CD) 240 MG 24 hr capsule Take 240 mg by mouth daily.   Yes Historical Provider, MD  furosemide (LASIX) 40 MG tablet Take 40 mg by mouth 2 (two) times daily.   Yes Historical Provider, MD  levalbuterol [facility] HFA) 45 MCG/ACT inhaler Inhale 1-2 puffs into the lungs every 4 (four) hours as needed for wheezing.   Yes Historical Provider, MD  lisinopril (PRINIVIL,ZESTRIL) 20 MG tablet Take 20 mg by mouth daily.   Yes Historical Provider, MD  nitroGLYCERIN (NITROSTAT) 0.4 MG SL tablet Place 0.4 mg under the tongue every 5 (five) minutes as needed for chest pain.   Yes Historical Provider, MD  potassium chloride SA (K-DUR,KLOR-CON) 20 MEQ tablet Take 20 mEq by mouth daily.   Yes Historical Provider, MD  rosuvastatin (CRESTOR) 40 MG tablet Take 40 mg by mouth daily.   Yes Historical Provider, MD   Physical Exam: Filed Vitals:   02/02/13 1915  BP: 93/66  Pulse:   Temp:   Resp: 13    BP 93/66  Pulse 100  Temp(Src)  97.9 F (36.6 C) (Oral)  Resp 13  Wt 140.615 kg (310 lb)  SpO2 100%  General Appearance:    Alert, oriented, no distress, appears older than stated age  Head:    Normocephalic, atraumatic  Eyes:    PERRL, EOMI, sclera non-icteric        Nose:   Nares without drainage or epistaxis. Mucosa, turbinates normal  Throat:   Moist mucous membranes. Oropharynx without erythema or exudate.  Neck:   Supple. No carotid bruits.  No thyromegaly.  No lymphadenopathy.   Back:     No CVA tenderness, no spinal tenderness  Lungs:     Clear to auscultation bilaterally, without wheezes, rhonchi or rales  Chest wall:    No tenderness to palpitation  Heart:    Regular rate and rhythm without murmurs, gallops, rubs  Abdomen:     Soft, non-tender, nondistended,  normal bowel sounds, no organomegaly  Genitalia:    deferred  Rectal:    deferred  Extremities:   No clubbing, cyanosis or edema.  Pulses:   2+ and symmetric all extremities  Skin:   Skin color, texture, turgor normal, no rashes or lesions  Lymph nodes:   Cervical, supraclavicular, and axillary nodes normal  Neurologic:   CNII-XII intact. Normal strength, sensation and reflexes      throughout    Labs on Admission:  Basic Metabolic Panel:  Recent Labs Lab 02/02/13 1622  NA 138  K 5.6*  CL 104  CO2 21  GLUCOSE 103*  BUN 70*  CREATININE 7.83*  CALCIUM 9.4   Liver Function Tests:  Recent Labs Lab 02/02/13 1622  AST 8  ALT 9  ALKPHOS 111  BILITOT 0.4  PROT 7.5  ALBUMIN 3.5   No results found for this basename: LIPASE, AMYLASE,  in the last 168 hours No results found for this basename: AMMONIA,  in the last 168 hours CBC:  Recent Labs Lab 02/02/13 1622  WBC 8.5  NEUTROABS 6.2  HGB 7.3*  HCT 23.7*  MCV 87.1  PLT 242   Cardiac Enzymes:  Recent Labs Lab 02/02/13 1622  TROPONINI <0.30    BNP (last 3 results) No results found for this basename: PROBNP,  in the last 8760 hours CBG: No results found for this basename: GLUCAP,  in the last 168 hours  Radiological Exams on Admission: Dg Chest Portable 1 View  02/02/2013   CLINICAL DATA:  Weakness, presyncope  EXAM: PORTABLE CHEST - 1 VIEW  COMPARISON:  06/16/2011  FINDINGS: Mildly low lung volumes. There is mild elevation the right hemidiaphragm. Cardiac silhouette is within normal limits. There is no evidence of focal infiltrates, effusions, nor edema. Dextroscoliosis identified within the thoracic spine.  IMPRESSION: No evidence of acute cardiopulmonary disease.   Electronically Signed   By: Salome Holmes M.D.   On: 02/02/2013 17:34    EKG: Independently reviewed.  Assessment/Plan Principal Problem:   Acute renal failure Active Problems:   CARDIOMYOPATHY, ISCHEMIC   Hypotension   Chronic blood loss  anemia   1. Acute renal failure - suspect pre-renal primarily, although why he has ongoing hypotension is unclear, volume resuscitation, strict intake and output, bladder scan showed at least 450cc urine retention so foley being placed.  Likely needs nephrology consult in AM. 2. Hypotension - IVF, holding all BP meds 3. Chronic blood loss anemia - likely now compounded by anemia of kidney failure, got PRBC in ED, holding all anticoagulation given GI blood loss, was supposed to see Dr.  Perry on 11/5 but canceled appointment because he was sick. 4. Hyperkalemia - very mild at 5.6 will monitor, hold PO potassium he takes as a home med, and recheck with AM labs.    Code Status: Full Family Communication: Wife at bedside Disposition Plan: Admit to inpatient   Time spent: 70 min  GARDNER, JARED M. [facility] Pager [phone]  If 7AM-7PM, please contact the day team taking care of the patient [email or web address] Password [facility] 02/02/2013, 7:32 PM

## 2013-02-02 NOTE — ED Notes (Signed)
Pt arrives via EMS from home with generalized weakness, increasing over the last month. EMS reports pt is orthostatic. BP as low as 90/40 upon standing with significant heart rate increase. Pt alert, oriented x4, very dry appearance, decreased appetite. 20g left hand. 250cc infused PTA.

## 2013-02-03 LAB — CBC
HCT: 23 % — ABNORMAL LOW (ref 39.0–52.0)
MCHC: 30.9 g/dL (ref 30.0–36.0)
Platelets: 176 10*3/uL (ref 150–400)
RDW: 19.6 % — ABNORMAL HIGH (ref 11.5–15.5)
WBC: 7.5 10*3/uL (ref 4.0–10.5)

## 2013-02-03 LAB — BASIC METABOLIC PANEL
BUN: 64 mg/dL — ABNORMAL HIGH (ref 6–23)
Chloride: 108 mEq/L (ref 96–112)
Creatinine, Ser: 6.46 mg/dL — ABNORMAL HIGH (ref 0.50–1.35)
GFR calc Af Amer: 10 mL/min — ABNORMAL LOW (ref >90)
GFR calc non Af Amer: 8 mL/min — ABNORMAL LOW (ref >90)
Glucose, Bld: 88 mg/dL (ref 70–99)
Potassium: 5.5 mEq/L — ABNORMAL HIGH (ref 3.5–5.1)

## 2013-02-03 LAB — PREPARE RBC (CROSSMATCH)

## 2013-02-03 LAB — POTASSIUM: Potassium: 5.5 mEq/L — ABNORMAL HIGH (ref 3.5–5.1)

## 2013-02-03 MED ORDER — LEVALBUTEROL TARTRATE 45 MCG/ACT IN AERO
1.0000 | INHALATION_SPRAY | RESPIRATORY_TRACT | Status: DC | PRN
  Administered 2013-02-18 – 2013-02-19 (×2): 2 via RESPIRATORY_TRACT
  Filled 2013-02-03 (×2): qty 15

## 2013-02-03 MED ORDER — WHITE PETROLATUM GEL
Status: AC
  Administered 2013-02-03: 0.2
  Filled 2013-02-03: qty 5

## 2013-02-03 NOTE — Progress Notes (Signed)
[facility] Progress Note [place] TEAM 1 - Stepdown/ICU TEAM   JAYLAN HINOJOSA ZOX:[id number] DOB: 1950-04-26 DOA: 02/02/2013 PCP: No primary provider on file.  Admit HPI / Brief Narrative: 62 y.o. male who presented to the ED after 4 weeks of generalized weakness. His family member had been trying to convince him to get checked out during that time period but he kept refusing saying he just "had a cold". He apparently had been sleeping up to 22 hours a day. Decreased po intake, getting out of bed only to go to bathroom and immediately back to bed after.   In the ED patient found to have creatinine 7.8 (baseline 1.3), and was hypotensive.   HPI/Subjective: Patient is alert and conversant.  He has no specific complaints at the present time with exception to generalized weakness and poor appetite.  He specifically denies shortness of breath chest pain vomiting or abdominal pain.  Assessment/Plan:  Severe acute renal failure Improving w/ volume expansion - no acute indication for HD - no hydronephrosis noted on [place] - crt was 7.83 at time of presentation - baseline crt appears to be ~1.3 (Oct 2014)  Hypotension Appears to primarily be due to hypovolemia - continue to hydrate and follow  Normocytic anemia  was supposed to see Dr. Marina Goodell on 11/5 but canceled appointment because he was sick - had guiac + stools at that time, with Fe studies c/w Fe deficiency - clearly also a component of renal disease affecting his hemoglobin - transfuse as needed to keep hemoglobin 8.0 or greater in the setting of known significant coronary artery disease  B12 deficiency? B12 level Oct 2014 was quit low at 251 - unclear if was replaced - will recheck  HLD Hold treatment until volume status more stable  Hx of diastolic CHF as well as ischemic CM/systolic CHF EF 45-50% via cath 2013 - care with volume resuscitation as patient will be a risk for rapidly converting to volume overload  COPD Well  compensated at present  Hx of CAD s/p PTCA w/ stents x 2 2010 Asymptomatic  Obesity - Body mass index is 40.9 kg/(m^2).  Code Status: FULL Family Communication: Spoke with patient and wife at bedside Disposition Plan: SDU  Consultants: None  Procedures: None  Antibiotics: None  DVT prophylaxis: SCDs  Objective: Blood pressure 92/55, pulse 98, temperature 98.2 F (36.8 C), temperature source Oral, resp. rate 14, height 5' 10.5" (1.791 m), weight 131.2 kg (289 lb 3.9 oz), SpO2 99.00%.  Intake/Output Summary (Last 24 hours) at 02/03/13 1119 Last data filed at 02/03/13 0343  Gross per 24 hour  Intake    409 ml  Output    900 ml  Net   -491 ml   Exam: General: No acute respiratory distress at rest Lungs: Clear to auscultation bilaterally without wheezes or crackles Cardiovascular: Regular rate and rhythm without murmur gallop or rub normal S1 and S2 Abdomen: Obese, ,ontender, nondistended, soft, bowel sounds positive, no rebound, no ascites, no appreciable mass Extremities: No significant cyanosis, clubbing, or edema bilateral lower extremities  Data Reviewed: Basic Metabolic Panel:  Recent Labs Lab 02/02/13 1622 02/02/13 2000 02/02/13 2355 02/03/13 0359 02/03/13 0825  NA 138  --   --  140  --   K 5.6* 5.2* 5.8* 5.5*  5.5* 5.6*  CL 104  --   --  108  --   CO2 21  --   --  19  --   GLUCOSE 103*  --   --  88  --   BUN 70*  --   --  64*  --   CREATININE 7.83*  --   --  6.46*  --   CALCIUM 9.4  --   --  9.0  --    Liver Function Tests:  Recent Labs Lab 02/02/13 1622  AST 8  ALT 9  ALKPHOS 111  BILITOT 0.4  PROT 7.5  ALBUMIN 3.5   CBC:  Recent Labs Lab 02/02/13 1622 02/03/13 0359  WBC 8.5 7.5  NEUTROABS 6.2  --   HGB 7.3* 7.1*  HCT 23.7* 23.0*  MCV 87.1 86.8  PLT 242 176   Cardiac Enzymes:  Recent Labs Lab 02/02/13 1622  TROPONINI <0.30   BNP (last 3 results) No results found for this basename: PROBNP,  in the last 8760  hours  CBG: No results found for this basename: GLUCAP,  in the last 168 hours  Recent Results (from the past 240 hour(s))  MRSA PCR SCREENING     Status: None   Collection Time    02/02/13 10:35 PM      Result Value Range Status   MRSA by PCR NEGATIVE  NEGATIVE Final   Comment:            The GeneXpert MRSA Assay (FDA     approved for NASAL specimens     only), is one component of a     comprehensive MRSA colonization     surveillance program. It is not     intended to diagnose MRSA     infection nor to guide or     monitor treatment for     MRSA infections.     Studies:  Recent x-ray studies have been reviewed in detail by the Attending Physician  Scheduled Meds:  Scheduled Meds: . sodium chloride  3 mL Intravenous Q12H    Time spent on care of this patient: 35 mins   [facility] T  [facility] Office  [phone] Pager - Text Page per Loretha Stapler as per below:  On-Call/Text Page:      [email or web address]      password [email or web address]  If 7PM-7AM, please contact night-coverage [email or web address] Password [email or web address] 02/03/2013, 11:19 AM   LOS: 1 day

## 2013-02-04 LAB — PREPARE RBC (CROSSMATCH)

## 2013-02-04 LAB — RENAL FUNCTION PANEL
Albumin: 2.8 g/dL — ABNORMAL LOW (ref 3.5–5.2)
CO2: 17 mEq/L — ABNORMAL LOW (ref 19–32)
Calcium: 8.9 mg/dL (ref 8.4–10.5)
Chloride: 108 mEq/L (ref 96–112)
Creatinine, Ser: 4.68 mg/dL — ABNORMAL HIGH (ref 0.50–1.35)
GFR calc Af Amer: 14 mL/min — ABNORMAL LOW (ref >90)
GFR calc non Af Amer: 12 mL/min — ABNORMAL LOW (ref >90)
Phosphorus: 4.3 mg/dL (ref 2.3–4.6)
Potassium: 5 mEq/L (ref 3.5–5.1)
Sodium: 138 mEq/L (ref 135–145)

## 2013-02-04 LAB — CBC
HCT: 22.8 % — ABNORMAL LOW (ref 39.0–52.0)
MCHC: 32 g/dL (ref 30.0–36.0)
MCV: 86.7 fL (ref 78.0–100.0)
Platelets: 160 10*3/uL (ref 150–400)
RBC: 2.63 MIL/uL — ABNORMAL LOW (ref 4.22–5.81)
RDW: 19.2 % — ABNORMAL HIGH (ref 11.5–15.5)
WBC: 7.9 10*3/uL (ref 4.0–10.5)

## 2013-02-04 LAB — VITAMIN B12: Vitamin B-12: 393 pg/mL (ref 211–911)

## 2013-02-04 LAB — FOLATE: Folate: 2.7 ng/mL — ABNORMAL LOW

## 2013-02-04 NOTE — Progress Notes (Signed)
[facility] Progress Note [place] TEAM 1 - Stepdown/ICU TEAM   Larry [name] ZOX:[id number] DOB: August 21, 1950 DOA: 02/02/2013 PCP: No primary provider on file.  Admit HPI / Brief Narrative: 62 y.o. male who presented to the ED after 4 weeks of generalized weakness. His family member had been trying to convince him to get checked out during that time period but he kept refusing saying he just "had a cold". He apparently had been sleeping up to 22 hours a day. Decreased po intake, getting out of bed only to go to bathroom and immediately back to bed after.   In the ED patient found to have creatinine 7.8 (baseline 1.3), and was hypotensive.   HPI/Subjective: Patient is alert and denies CP or SOB. Has great appetite.  He denies n/v, or abdom pain today.    Assessment/Plan:  Severe acute renal failure - continues to Improve w/ volume expansion  - no acute indication for HD  - no hydronephrosis noted on [place] - crt was > 6 at time of presentation/ baseline crt appears to be ~1.3 (Oct 2014)  Hypotension - Appears to primarily be due to hypovolemia  - continue to hydrate and follow  Normocytic anemia  -was supposed to see Dr. Marina Goodell on 11/5 but canceled appointment because he was sick - had guiac + stools at that time, with Fe studies c/w Fe deficiency  - clearly also a component of renal disease affecting his hemoglobin  - transfused as needed to keep hemoglobin 8.0 or greater in the setting of known significant coronary artery disease -has rec 2 units 12/14 and will give 2 more units today -pt queried and no GIB sx's pre admit - delay GI eval until renal fxn more stable but may accomplish while inpatient unless Hgb stabilized signif   B12 deficiency? - B12 level Oct 2014 was quit low at 251  - unclear if was replaced  - recheck pending  HLD -Hold treatment until volume status more stable  Hx of diastolic CHF as well as ischemic CM/systolic CHF -EF 81-19% via cath 2013   -care with volume resuscitation as patient will be a risk for rapidly converting to volume overload -unclear why developed such significant ARF but ?low perfusion from progressive SHF/ischemia - consider repeat ECHO this admit  COPD Well compensated at present  Hx of CAD s/p PTCA w/ stents x 2 2010 -Asymptomatic (see above)  Obesity - Body mass index is 41.59 kg/(m^2).  Code Status: FULL Family Communication: Spoke with patient at bedside Disposition Plan: SDU  Consultants: None  Procedures: None  Antibiotics: None  DVT prophylaxis: SCDs  Objective: Blood pressure 119/67, pulse 56, temperature 98.6 F (37 C), temperature source Oral, resp. rate 19, height 5' 10.5" (1.791 m), weight 294 lb 1.5 oz (133.4 kg), SpO2 98.00%.  Intake/Output Summary (Last 24 hours) at 02/04/13 1321 Last data filed at 02/04/13 1233  Gross per 24 hour  Intake    578 ml  Output   2000 ml  Net  -1422 ml   Exam: General: No acute respiratory distress at rest Lungs: Clear to auscultation bilaterally without wheezes or crackles Cardiovascular: Regular rate and rhythm without murmur gallop or rub normal S1 and S2 Abdomen: Obese, nontender, nondistended, soft, bowel sounds positive, no rebound, no ascites, no appreciable mass Extremities: No significant cyanosis, clubbing, or edema bilateral lower extremities  Data Reviewed: Basic Metabolic Panel:  Recent Labs Lab 02/02/13 1622 02/02/13 2000 02/02/13 2355 02/03/13 0359 02/03/13 0825 02/04/13 0345  NA 138  --   --  140  --  138  K 5.6* 5.2* 5.8* 5.5*  5.5* 5.6* 5.0  CL 104  --   --  108  --  108  CO2 21  --   --  19  --  17*  GLUCOSE 103*  --   --  88  --  79  BUN 70*  --   --  64*  --  54*  CREATININE 7.83*  --   --  6.46*  --  4.68*  CALCIUM 9.4  --   --  9.0  --  8.9  PHOS  --   --   --   --   --  4.3   Liver Function Tests:  Recent Labs Lab 02/02/13 1622 02/04/13 0345  AST 8  --   ALT 9  --   ALKPHOS 111  --   BILITOT  0.4  --   PROT 7.5  --   ALBUMIN 3.5 2.8*   CBC:  Recent Labs Lab 02/02/13 1622 02/03/13 0359 02/04/13 0345  WBC 8.5 7.5 7.9  NEUTROABS 6.2  --   --   HGB 7.3* 7.1* 7.3*  HCT 23.7* 23.0* 22.8*  MCV 87.1 86.8 86.7  PLT 242 176 160   Cardiac Enzymes:  Recent Labs Lab 02/02/13 1622  TROPONINI <0.30    Recent Results (from the past 240 hour(s))  MRSA PCR SCREENING     Status: None   Collection Time    02/02/13 10:35 PM      Result Value Range Status   MRSA by PCR NEGATIVE  NEGATIVE Final   Comment:            The GeneXpert MRSA Assay (FDA     approved for NASAL specimens     only), is one component of a     comprehensive MRSA colonization     surveillance program. It is not     intended to diagnose MRSA     infection nor to guide or     monitor treatment for     MRSA infections.     Studies:  Recent x-ray studies have been reviewed in detail by the Attending Physician  Scheduled Meds:  Scheduled Meds: . sodium chloride  3 mL Intravenous Q12H    Time spent on care of this patient: 35 mins   ELLIS,ALLISON L. ANP  [facility] Office  [phone] Pager - Text Page per Loretha Stapler as per below:  On-Call/Text Page:      [email or web address]      password [email or web address]  If 7PM-7AM, please contact night-coverage [email or web address] Password [email or web address] 02/04/2013, 1:21 PM   LOS: 2 days   I have personally examined this patient and reviewed the entire database. I have reviewed the above note, made any necessary editorial changes, and agree with its content.  Lonia Blood, MD [facility]

## 2013-02-05 DIAGNOSIS — I4891 Unspecified atrial fibrillation: Secondary | ICD-10-CM | POA: Diagnosis present

## 2013-02-05 LAB — TYPE AND SCREEN
Antibody Screen: NEGATIVE
Unit division: 0
Unit division: 0
Unit division: 0

## 2013-02-05 LAB — BASIC METABOLIC PANEL
BUN: 33 mg/dL — ABNORMAL HIGH (ref 6–23)
Calcium: 8.4 mg/dL (ref 8.4–10.5)
Chloride: 113 mEq/L — ABNORMAL HIGH (ref 96–112)
GFR calc Af Amer: 29 mL/min — ABNORMAL LOW (ref >90)
GFR calc non Af Amer: 25 mL/min — ABNORMAL LOW (ref >90)
Glucose, Bld: 84 mg/dL (ref 70–99)
Potassium: 4.4 mEq/L (ref 3.5–5.1)
Sodium: 140 mEq/L (ref 135–145)

## 2013-02-05 LAB — CBC
HCT: 25.8 % — ABNORMAL LOW (ref 39.0–52.0)
MCH: 27.7 pg (ref 26.0–34.0)
MCHC: 31.4 g/dL (ref 30.0–36.0)
Platelets: 158 10*3/uL (ref 150–400)
RBC: 2.92 MIL/uL — ABNORMAL LOW (ref 4.22–5.81)
RDW: 18.1 % — ABNORMAL HIGH (ref 11.5–15.5)
WBC: 7.4 10*3/uL (ref 4.0–10.5)

## 2013-02-05 MED ORDER — CARVEDILOL 25 MG PO TABS
25.0000 mg | ORAL_TABLET | Freq: Every day | ORAL | Status: DC
  Administered 2013-02-05 – 2013-02-09 (×5): 25 mg via ORAL
  Filled 2013-02-05 (×6): qty 1

## 2013-02-05 MED ORDER — FOLIC ACID 1 MG PO TABS
1.0000 mg | ORAL_TABLET | Freq: Every day | ORAL | Status: DC
  Administered 2013-02-06: 1 mg via ORAL
  Filled 2013-02-05 (×2): qty 1

## 2013-02-05 MED ORDER — DILTIAZEM HCL ER COATED BEADS 240 MG PO CP24
240.0000 mg | ORAL_CAPSULE | Freq: Every day | ORAL | Status: DC
  Administered 2013-02-05 – 2013-02-09 (×5): 240 mg via ORAL
  Filled 2013-02-05 (×5): qty 1

## 2013-02-05 MED ORDER — FOLIC ACID 5 MG/ML IJ SOLN
1.0000 mg | Freq: Every day | INTRAMUSCULAR | Status: DC
  Administered 2013-02-05: 1 mg via INTRAVENOUS
  Filled 2013-02-05: qty 0.2

## 2013-02-05 MED ORDER — CYANOCOBALAMIN 1000 MCG/ML IJ SOLN
1000.0000 ug | Freq: Every day | INTRAMUSCULAR | Status: AC
  Administered 2013-02-05 – 2013-02-09 (×5): 1000 ug via SUBCUTANEOUS
  Filled 2013-02-05 (×5): qty 1

## 2013-02-05 NOTE — ED Provider Notes (Signed)
I saw and evaluated the patient, reviewed the resident's note and I agree with the findings and plan.  EKG Interpretation    Date/Time:  Saturday February 02 2013 16:10:56 EST Ventricular Rate:  101 PR Interval:  137 QRS Duration: 104 QT Interval:  354 QTC Calculation: 459 R Axis:   63 Text Interpretation:  Sinus tachycardia Low voltage, extremity leads No significant change since Confirmed by POLLINA  MD, CHRISTOPHER ([id number]) on 02/02/2013 4:15:44 PM            Patient originally presented for fatigue, found to be hypotensive at arrival. Workup revealed anemia, presumed GI bleed and acute renal failure requiring admission.    Gilda Crease, MD 02/05/13 [phone]

## 2013-02-05 NOTE — Progress Notes (Signed)
Utilization Review Completed.  

## 2013-02-05 NOTE — Progress Notes (Signed)
[facility] Progress Note [place] TEAM 1 - Stepdown/ICU TEAM   Larry [name] WUJ:[id number] DOB: 1950/05/04 DOA: 02/02/2013 PCP: No primary provider on file.  Admit HPI / Brief Narrative: 62 y.o. male who presented to the ED after 4 weeks of generalized weakness. His family member had been trying to convince him to get checked out during that time period but he kept refusing saying he just "had a cold". He apparently had been sleeping up to 22 hours a day. Decreased po intake, getting out of bed only to go to bathroom and immediately back to bed after.   In the ED patient found to have creatinine 7.8 (baseline 1.3), and was hypotensive.   HPI/Subjective: Patient is alert and denies CP or SOB. Has great appetite.  He denies n/v, or abdom pain today.    Assessment/Plan:  Severe acute renal failure - continues to Improve w/ volume expansion - Cr now down to 2.33 so kvo fluids and dc foley - no hydronephrosis noted on [place] - crt was > 6 at time of presentation/ baseline crt appears to be ~1.3 (Oct 2014)  Atrial Tachycardia -rates up to 130's at rest and occasionally c/w atrial fibrillation -resume home Coreg and Cardizem PO  Hypotension - Appearred to primarily be due to hypovolemia-resolved  - KVO fluids  Normocytic anemia  -was supposed to see Dr. Marina Goodell on 11/5 but canceled appointment because he was sick - had guiac + stools at that time, with Fe studies c/w Fe deficiency  - clearly also a component of renal disease affecting his hemoglobin  - transfused as needed to keep hemoglobin 8.0 or greater in the setting of known significant coronary artery disease -has rec 2 units 12/14 and will give 2 more units today -pt queried and no GIB sx's pre admit - delay GI eval until renal fxn more stable but may accomplish while inpatient unless Hgb stabilized signif   B12 and folate deficiency - B12 level Oct 2014 was quit low at 251 -repeat this admit 393 but given anemia will  replete -Folate very low so will give IV replete  HLD -Hold treatment until volume status more stable  Hx of diastolic CHF as well as ischemic CM/systolic CHF -EF 95-62% via cath 2013  -care with volume resuscitation as patient will be a risk for rapidly converting to volume overload -unclear why developed such significant ARF but ?low perfusion from progressive SHF/ischemia - consider repeat ECHO this admit  COPD Well compensated at present  Hx of CAD s/p PTCA w/ stents x 2 2010 -Asymptomatic (see above)  Obesity - Body mass index is 41.46 kg/(m^2).  Code Status: FULL Family Communication: Spoke with patient at bedside Disposition Plan: SDU  Consultants: None  Procedures: None  Antibiotics: None  DVT prophylaxis: SCDs  Objective: Blood pressure 140/98, pulse 94, temperature 98.4 F (36.9 C), temperature source Oral, resp. rate 13, height 5' 10.5" (1.791 m), weight 293 lb 3.4 oz (133 kg), SpO2 98.00%.  Intake/Output Summary (Last 24 hours) at 02/05/13 1255 Last data filed at 02/05/13 1200  Gross per 24 hour  Intake 2002.5 ml  Output   3125 ml  Net -1122.5 ml   Exam: General: No acute respiratory distress at rest Lungs: Clear to auscultation bilaterally without wheezes or crackles Cardiovascular: Irregular rate and rhythm without murmur gallop or rub normal S1 and S2 Abdomen: Obese, nontender, nondistended, soft, bowel sounds positive, no rebound, no ascites, no appreciable mass Extremities: No significant cyanosis, clubbing, or edema  bilateral lower extremities  Data Reviewed: Basic Metabolic Panel:  Recent Labs Lab 02/02/13 1622  02/02/13 2355 02/03/13 0359 02/03/13 0825 02/04/13 0345 02/05/13 0525  NA 138  --   --  140  --  138 140  K 5.6*  < > 5.8* 5.5*  5.5* 5.6* 5.0 4.4  CL 104  --   --  108  --  108 113*  CO2 21  --   --  19  --  17* 18*  GLUCOSE 103*  --   --  88  --  79 84  BUN 70*  --   --  64*  --  54* 33*  CREATININE 7.83*  --   --   6.46*  --  4.68* 2.66*  CALCIUM 9.4  --   --  9.0  --  8.9 8.4  PHOS  --   --   --   --   --  4.3  --   < > = values in this interval not displayed. Liver Function Tests:  Recent Labs Lab 02/02/13 1622 02/04/13 0345  AST 8  --   ALT 9  --   ALKPHOS 111  --   BILITOT 0.4  --   PROT 7.5  --   ALBUMIN 3.5 2.8*   CBC:  Recent Labs Lab 02/02/13 1622 02/03/13 0359 02/04/13 0345 02/05/13 0525  WBC 8.5 7.5 7.9 7.4  NEUTROABS 6.2  --   --   --   HGB 7.3* 7.1* 7.3* 8.1*  HCT 23.7* 23.0* 22.8* 25.8*  MCV 87.1 86.8 86.7 88.4  PLT 242 176 160 158   Cardiac Enzymes:  Recent Labs Lab 02/02/13 1622  TROPONINI <0.30    Recent Results (from the past 240 hour(s))  MRSA PCR SCREENING     Status: None   Collection Time    02/02/13 10:35 PM      Result Value Range Status   MRSA by PCR NEGATIVE  NEGATIVE Final   Comment:            The GeneXpert MRSA Assay (FDA     approved for NASAL specimens     only), is one component of a     comprehensive MRSA colonization     surveillance program. It is not     intended to diagnose MRSA     infection nor to guide or     monitor treatment for     MRSA infections.     Studies:  Recent x-ray studies have been reviewed in detail by the Attending Physician  Scheduled Meds:  Scheduled Meds: . carvedilol  25 mg Oral Daily  . cyanocobalamin  1,000 mcg Subcutaneous Daily  . diltiazem  240 mg Oral Daily  . [START ON 02/06/2013] folic acid  1 mg Oral Daily  . sodium chloride  3 mL Intravenous Q12H    Time spent on care of this patient: 35 mins   ELLIS,ALLISON L. ANP  [facility] Office  [phone] Pager - Text Page per Loretha Stapler as per below:  On-Call/Text Page:      [email or web address]      password [email or web address]  If 7PM-7AM, please contact night-coverage [email or web address] Password [facility] 02/05/2013, 12:55 PM   LOS: 3 days      I have examined the patient, reviewed the chart and modified the above note which I agree with.    Leonard Hendler,MD [phone] 02/05/2013, 6:15 PM

## 2013-02-06 ENCOUNTER — Ambulatory Visit: Payer: Federal, State, Local not specified - PPO | Admitting: Family Medicine

## 2013-02-06 LAB — BASIC METABOLIC PANEL
CO2: 18 mEq/L — ABNORMAL LOW (ref 19–32)
Calcium: 8.9 mg/dL (ref 8.4–10.5)
Creatinine, Ser: 2.28 mg/dL — ABNORMAL HIGH (ref 0.50–1.35)
GFR calc Af Amer: 34 mL/min — ABNORMAL LOW (ref >90)
GFR calc non Af Amer: 30 mL/min — ABNORMAL LOW (ref >90)
Potassium: 4.9 mEq/L (ref 3.5–5.1)
Sodium: 140 mEq/L (ref 135–145)

## 2013-02-06 MED ORDER — FOLIC ACID 1 MG PO TABS
1.0000 mg | ORAL_TABLET | Freq: Every day | ORAL | Status: DC
  Administered 2013-02-08 – 2013-02-11 (×4): 1 mg via ORAL
  Filled 2013-02-06 (×5): qty 1

## 2013-02-06 MED ORDER — FOLIC ACID 5 MG/ML IJ SOLN
1.0000 mg | Freq: Once | INTRAMUSCULAR | Status: AC
  Administered 2013-02-07: 1 mg via INTRAVENOUS
  Filled 2013-02-06: qty 0.2

## 2013-02-06 NOTE — Progress Notes (Signed)
[facility] Progress Note [place] TEAM 1 - Stepdown/ICU TEAM   Larry [name] ZOX:[id number] DOB: Aug 16, 1950 DOA: 02/02/2013 PCP: No primary provider on file.  Admit HPI / Brief Narrative: 62 y.o. male who presented to the ED after 4 weeks of generalized weakness. His family member had been trying to convince him to get checked out during that time period but he kept refusing saying he just "had a cold". He apparently had been sleeping up to 22 hours a day. Decreased po intake, getting out of bed only to go to bathroom and immediately back to bed after.   In the ED patient found to have creatinine 7.8 (baseline 1.3), and was hypotensive.   HPI/Subjective: Patient is alert, no complaints. No GIB sx's or abdominal pain, no CP/SOB  Assessment/Plan:  Severe acute renal failure - continues to Improve w/ volume expansion - Cr now down to 2.28 - no hydronephrosis noted on [place] - crt was > 6 at time of presentation/ baseline crt appears to be ~1.3 (Oct 2014)  Atrial Tachycardia -rates up to 130's at rest and occasionally c/w atrial fibrillation 12/16 -resumed home Coreg and Cardizem PO 12/16 -rates now controlled  Hypotension - Appeared to primarily be due to hypovolemia - resolved   Normocytic anemia  -was supposed to see Dr. Marina Goodell on 11/5 but canceled appointment because he was sick - had guiac + stools at that time, with Fe studies c/w Fe deficiency  -clearly also a component of renal disease affecting his hemoglobin  -transfused as needed to keep hemoglobin 8.0 or greater in the setting of known significant coronary artery disease -has rec 4 units PRBCs this admission  -pt queried and no GIB sx's pre admit   B12 and folate deficiency -B12 level Oct 2014 was quit low at 251 - repeated this admit 393 but given anemia we are loading him  -Folate low so was given IV and now continues po  HLD -Hold treatment until volume status more stable  Hx of diastolic CHF as well  as ischemic CM/systolic CHF -EF 81-19% via cath 2013  -care with volume resuscitation as patient will be a risk for rapidly converting to volume overload  COPD Well compensated at present  Hx of CAD s/p PTCA w/ stents x 2 2010 -Asymptomatic (see above)  Obesity - Body mass index is 41.46 kg/(m^2).  Code Status: FULL Family Communication: Spoke with patient and wife at bedside Disposition Plan: Transfer to Telemetry - cont to follow crt and Hgb - consider inpt GI eval v/s outpt f/u   Consultants: None  Procedures: None  Antibiotics: None  DVT prophylaxis: SCDs  Objective: Blood pressure 110/50, pulse 89, temperature 98.9 F (37.2 C), temperature source Oral, resp. rate 16, height 5' 10.5" (1.791 m), weight 293 lb 3.4 oz (133 kg), SpO2 98.00%.  Intake/Output Summary (Last 24 hours) at 02/06/13 1125 Last data filed at 02/06/13 0815  Gross per 24 hour  Intake 446.75 ml  Output   1225 ml  Net -778.25 ml   Exam: General: No acute respiratory distress  Lungs: Clear to auscultation bilaterally without wheezes or crackles Cardiovascular: Regular rate and rhythm without murmur gallop or rub normal S1 and S2 Abdomen: Obese, nontender, nondistended, soft, bowel sounds positive, no rebound, no ascites, no appreciable mass Extremities: No significant cyanosis, clubbing, or edema bilateral lower extremities  Data Reviewed: Basic Metabolic Panel:  Recent Labs Lab 02/02/13 1622  02/03/13 0359 02/03/13 0825 02/04/13 0345 02/05/13 0525 02/06/13 0430  NA  138  --  140  --  138 140 140  K 5.6*  < > 5.5*  5.5* 5.6* 5.0 4.4 4.9  CL 104  --  108  --  108 113* 112  CO2 21  --  19  --  17* 18* 18*  GLUCOSE 103*  --  88  --  79 84 90  BUN 70*  --  64*  --  54* 33* 26*  CREATININE 7.83*  --  6.46*  --  4.68* 2.66* 2.28*  CALCIUM 9.4  --  9.0  --  8.9 8.4 8.9  PHOS  --   --   --   --  4.3  --   --   < > = values in this interval not displayed. Liver Function Tests:  Recent  Labs Lab 02/02/13 1622 02/04/13 0345  AST 8  --   ALT 9  --   ALKPHOS 111  --   BILITOT 0.4  --   PROT 7.5  --   ALBUMIN 3.5 2.8*   CBC:  Recent Labs Lab 02/02/13 1622 02/03/13 0359 02/04/13 0345 02/05/13 0525  WBC 8.5 7.5 7.9 7.4  NEUTROABS 6.2  --   --   --   HGB 7.3* 7.1* 7.3* 8.1*  HCT 23.7* 23.0* 22.8* 25.8*  MCV 87.1 86.8 86.7 88.4  PLT 242 176 160 158   Cardiac Enzymes:  Recent Labs Lab 02/02/13 1622  TROPONINI <0.30    Recent Results (from the past 240 hour(s))  MRSA PCR SCREENING     Status: None   Collection Time    02/02/13 10:35 PM      Result Value Range Status   MRSA by PCR NEGATIVE  NEGATIVE Final   Comment:            The GeneXpert MRSA Assay (FDA     approved for NASAL specimens     only), is one component of a     comprehensive MRSA colonization     surveillance program. It is not     intended to diagnose MRSA     infection nor to guide or     monitor treatment for     MRSA infections.     Studies:  Recent x-ray studies have been reviewed in detail by the Attending Physician  Scheduled Meds:  Scheduled Meds: . carvedilol  25 mg Oral Daily  . cyanocobalamin  1,000 mcg Subcutaneous Daily  . diltiazem  240 mg Oral Daily  . folic acid  1 mg Oral Daily  . sodium chloride  3 mL Intravenous Q12H    Time spent on care of this patient: 35 mins   ELLIS,ALLISON L. ANP  [facility] Office  [phone] Pager - Text Page per Loretha Stapler as per below:  On-Call/Text Page:      [email or web address]      password [email or web address]  If 7PM-7AM, please contact night-coverage [email or web address] Password [email or web address] 02/06/2013, 11:25 AM   LOS: 4 days   I have personally examined this patient and reviewed the entire database. I have reviewed the above note, made any necessary editorial changes, and agree with its content.  Lonia Blood, MD [facility]

## 2013-02-06 NOTE — Progress Notes (Signed)
1141 report called to receiving nurse, Inocente Salles, RN.  Patient is stable and ready for transfer to 6E27.  Wife is at the bedside and will accompany patient in transportation to 6E.

## 2013-02-07 ENCOUNTER — Ambulatory Visit: Payer: Federal, State, Local not specified - PPO | Admitting: Family Medicine

## 2013-02-07 DIAGNOSIS — G4730 Sleep apnea, unspecified: Secondary | ICD-10-CM

## 2013-02-07 DIAGNOSIS — E669 Obesity, unspecified: Secondary | ICD-10-CM

## 2013-02-07 DIAGNOSIS — E785 Hyperlipidemia, unspecified: Secondary | ICD-10-CM

## 2013-02-07 DIAGNOSIS — I10 Essential (primary) hypertension: Secondary | ICD-10-CM

## 2013-02-07 LAB — BASIC METABOLIC PANEL
Calcium: 8.9 mg/dL (ref 8.4–10.5)
Chloride: 112 mEq/L (ref 96–112)
GFR calc non Af Amer: 35 mL/min — ABNORMAL LOW (ref >90)
Glucose, Bld: 105 mg/dL — ABNORMAL HIGH (ref 70–99)
Potassium: 4.2 mEq/L (ref 3.5–5.1)
Sodium: 141 mEq/L (ref 135–145)

## 2013-02-07 LAB — CBC
MCH: 28 pg (ref 26.0–34.0)
MCV: 90.2 fL (ref 78.0–100.0)
Platelets: 167 10*3/uL (ref 150–400)
RBC: 2.86 MIL/uL — ABNORMAL LOW (ref 4.22–5.81)

## 2013-02-07 MED ORDER — DOCUSATE SODIUM 100 MG PO CAPS
100.0000 mg | ORAL_CAPSULE | Freq: Two times a day (BID) | ORAL | Status: DC
  Administered 2013-02-07 – 2013-02-11 (×10): 100 mg via ORAL
  Filled 2013-02-07 (×12): qty 1

## 2013-02-07 MED ORDER — PEG 3350-KCL-NA BICARB-NACL 420 G PO SOLR
4000.0000 mL | Freq: Once | ORAL | Status: AC
  Administered 2013-02-07: 4000 mL via ORAL
  Filled 2013-02-07: qty 4000

## 2013-02-07 MED ORDER — SODIUM CHLORIDE 0.9 % IV SOLN
INTRAVENOUS | Status: DC
  Administered 2013-02-07 – 2013-02-08 (×3): via INTRAVENOUS

## 2013-02-07 MED ORDER — ONDANSETRON HCL 4 MG/2ML IJ SOLN
4.0000 mg | Freq: Four times a day (QID) | INTRAMUSCULAR | Status: DC | PRN
  Administered 2013-02-07 – 2013-02-22 (×10): 4 mg via INTRAVENOUS
  Filled 2013-02-07 (×10): qty 2

## 2013-02-07 NOTE — Evaluation (Signed)
Physical Therapy Evaluation Patient Details Name: Larry [name] MRN: [id number] DOB: 05-01-1950 Today's Date: 02/07/2013 Time: 2130-8657 PT Time Calculation (min): 18 min  PT Assessment / Plan / Recommendation History of Present Illness  Pt is a 62 y.o. male adm from home due to generalized weakness that had been occurring for 4 weeks. In ED he was found to by hypotensive and to have a creatinine level of 7.8. Pt being treated for ARF, has been experiencing atrial tachycardia and being treated for hypotension and normocytic anemia. PMH includes CAD s/p PTCA with stents x 2 2010.  Clinical Impression  Pt adm due to the above. Presents with generalized weakness and decontioning due to hospitalization. Pt also presents with balance deficits due to weakness and deconditioning. Pt to benefit from skilled PT to address deficits and increase independence with functional mobility in order to return home with wife. Pt is agreeable to OPPT upon acute D/C to continue increasing strength and addressing high level balance deficits. Will need to assess need for AD prior to D/C; SPC Vs RW?[name] Kitchen     PT Assessment  Patient needs continued PT services    Follow Up Recommendations  Outpatient PT;Supervision - Intermittent;Supervision for mobility/OOB    Does the patient have the potential to tolerate intense rehabilitation      Barriers to Discharge        Equipment Recommendations  Cane;Other (comment) (will assess need for AD)    Recommendations for Other Services OT consult   Frequency Min 3X/week    Precautions / Restrictions Precautions Precautions: Fall Precaution Comments: no recent falls; pt slightly unsteady due to weakness  Restrictions Weight Bearing Restrictions: No   Pertinent Vitals/Pain No c/o pain.       Mobility  Bed Mobility Bed Mobility: Supine to Sit;Sitting - Scoot to Edge of Bed Supine to Sit: 6: Modified independent (Device/Increase time);HOB elevated;With  rails Sitting - Scoot to Edge of Bed: 6: Modified independent (Device/Increase time) Details for Bed Mobility Assistance: pt required increased time due to generalized weakness; relied on handrails; no physical (A) needed  Transfers Transfers: Sit to Stand;Stand to Sit Sit to Stand: 4: Min guard;From bed;With upper extremity assist Stand to Sit: 4: Min guard;To chair/3-in-1;With armrests;With upper extremity assist Details for Transfer Assistance: pt slightly unsteady with transfers due to generalized weakness; required min guard to steady and for safety; pt reaching for handheld (A) to brace; cues for technique and hand placement  Ambulation/Gait Ambulation/Gait Assistance: 4: Min assist;4: Min guard Ambulation Distance (Feet): 60 Feet Assistive device: 1 person hand held assist Ambulation/Gait Assistance Details: pt slightly unsteady with gt; min (A) initially to maintain balance and support Lt UE; pt became more steady with incr gt; cues for sequencing and safety; may benefit from [facility] to steady  Gait Pattern: Wide base of support;Decreased stride length (ER LEs) Gait velocity: decreased Stairs: No Wheelchair Mobility Wheelchair Mobility: No    Exercises General Exercises - Lower Extremity Ankle Circles/Pumps: AROM;Both;10 reps;Seated Long Arc Quad: AROM;Both;10 reps;Strengthening;Seated Hip Flexion/Marching: AROM;Both;Strengthening;10 reps;Seated   PT Diagnosis: Abnormality of gait;Generalized weakness  PT Problem List: Decreased strength;Decreased activity tolerance;Decreased balance;Decreased mobility;Decreased knowledge of use of DME;Impaired sensation PT Treatment Interventions: DME instruction;Gait training;Functional mobility training;Therapeutic activities;Therapeutic exercise;Balance training;Neuromuscular re-education;Patient/family education     PT Goals(Current goals can be found in the care plan section) Acute Rehab PT Goals Patient Stated Goal: to go home with wife  when ready PT Goal Formulation: With patient Time For Goal Achievement: 02/21/13 Potential to Achieve  Goals: Good  Visit Information  Last PT Received On: 02/07/13 Assistance Needed: +1 History of Present Illness: Pt is a 62 y.o. male adm from home due to generalized weakness        Prior Functioning  Home Living Family/patient expects to be discharged to:: Private residence Living Arrangements: Spouse/significant other Available Help at Discharge: Family;Available 24 hours/day Type of Home: House Home Access: Level entry Home Layout: Two level;Able to live on main level with bedroom/bathroom Home Equipment: Gilmer Mor - single point;Shower seat Additional Comments: pt was independent with all ADL's; has tub shower and reports he was able to stand and take shower; standard height commode Prior Function Level of Independence: Independent Comments: pt drives and is very independent; has been on disability since 2010 Communication Communication: No difficulties Dominant Hand: Right    Cognition  Cognition Arousal/Alertness: Awake/alert Behavior During Therapy: WFL for tasks assessed/performed Overall Cognitive Status: Within Functional Limits for tasks assessed    Extremity/Trunk Assessment Upper Extremity Assessment Upper Extremity Assessment: Defer to OT evaluation Lower Extremity Assessment Lower Extremity Assessment: RLE deficits/detail RLE Deficits / Details: grossly 3+/5; pt c/o upper thigh feeling "numb"; reports this is new RLE Sensation: decreased light touch Cervical / Trunk Assessment Cervical / Trunk Assessment: Normal   Balance Balance Balance Assessed: Yes Static Sitting Balance Static Sitting - Balance Support: Feet supported;No upper extremity supported Static Sitting - Level of Assistance: 5: Stand by assistance Static Standing Balance Static Standing - Balance Support: During functional activity;Left upper extremity supported Static Standing - Level of  Assistance: Other (comment) (min guard) High Level Balance High Level Balance Activites: Direction changes High Level Balance Comments: min guard to steady   End of Session PT - End of Session Equipment Utilized During Treatment: Gait belt Activity Tolerance: Patient tolerated treatment well Patient left: in chair;with call bell/phone within reach Nurse Communication: Mobility status;Precautions  GP     Donell Sievert, [place] [phone] 02/07/2013, 10:45 AM

## 2013-02-07 NOTE — Progress Notes (Signed)
[facility]                                                                                Patient Demographics  Larry [name], is a 62 y.o. male, DOB - 07-04-1950, ZOX:[id number]  Admit date - 02/02/2013   Admitting Physician Hillary Bow, DO  Outpatient Primary MD for the patient is No primary provider on file.  LOS - 5   Chief Complaint  Patient presents with  . Weakness  . Near Syncope        Assessment & Plan   Principal Problem:   Acute renal failure Active Problems:   CARDIOMYOPATHY, ISCHEMIC   Hypotension   Chronic blood loss anemia   Atrial fibrillation  Severe acute renal failure  -Improving, creatinine 1.99 -Will continue IV fluids -Renal ultrasound showed no hydronephrosis -May be secondary to medications versus dehydration  Atrial Tachycardia  -rates have been up to 130's at rest, currently controlled  -Continue Coreg and Cardizem  Hypotension  -Likely secondary to hypovolemia  -Resolved   Normocytic anemia  -H/H 8/25.8, stable, however patient has had dark stools and has had POCT positive -Multifactorial: GI related vs renal disease -Patient was supposed to see Dr. Marina Goodell as an outpatient however, this was cancelled due to illness.   -Iron studies show Iron def anemia -Will consult Dr. Elnoria Howard -Patient has received 4units PRBCs this admission  B12 and folate deficiency  -B12 level Oct 2014 was quit low at 251 - repeated this admit 393 but given anemia, received loading dose -Folate low so was given IV and now continues po   Hyperlipidemia -Hold treatment until volume status more stable   Hx of diastolic CHF as well as ischemic CM/systolic CHF  -EF 45-50% via cath 2013  -care with volume resuscitation as patient will be a risk for rapidly converting to volume overload   COPD  -Well compensated at present and not requiring any medication -Has Xopenex when necessary  Hx of CAD s/p PTCA w/ stents x 2 2010  -Asymptomatic (see  above)   Obesity - Body mass index is 41.46 kg/(m^2).  Nausea -Will add Zofran when necessary  Code Status: Full  Family Communication: None at this time.  Will contact wife.  Disposition Plan: Admitted.   Procedures  None  Consults   Gastroenterology  DVT Prophylaxis  SCDs  Lab Results  Component Value Date   PLT 167 02/07/2013    Medications  Scheduled Meds: . carvedilol  25 mg Oral Daily  . cyanocobalamin  1,000 mcg Subcutaneous Daily  . diltiazem  240 mg Oral Daily  . docusate sodium  100 mg Oral BID  . [START ON 02/08/2013] folic acid  1 mg Oral Daily  . polyethylene glycol-electrolytes  4,000 mL Oral Once  . sodium chloride  3 mL Intravenous Q12H   Continuous Infusions: . sodium chloride     PRN Meds:.levalbuterol, ondansetron (ZOFRAN) IV  Antibiotics    Anti-infectives   None     Time Spent in minutes   30 minutes   Kethan Papadopoulos D.O. on 02/07/2013 at 1:45 PM  Between 7am to 7pm - Pager - [phone]  After 7pm go to [email or web address] -  password [email or web address]  And look for the night coverage person covering for me after hours  [facility] Office  [phone]    Subjective:   Larry [name] seen and examined today.  Patient has no complaints today however would like to see a gastroenterologist while he is in the hospital. Patient denies dizziness, chest pain, shortness of breath, abdominal pain, N/V/D/C, new weakness, numbess, tingling.    Objective:   Filed Vitals:   02/06/13 1131 02/06/13 2037 02/07/13 0436 02/07/13 1000  BP: 108/65 100/62 99/64 114/71  Pulse:  86 74 77  Temp:  98.8 F (37.1 C) 99.7 F (37.6 C) 98.2 F (36.8 C)  TempSrc:  Oral Oral Oral  Resp:  16 16 18   Height:  5' 10.5" (1.791 m)    Weight:  133 kg (293 lb 3.4 oz)    SpO2:  98% 95% 95%    Wt Readings from Last 3 Encounters:  02/06/13 133 kg (293 lb 3.4 oz)  12/06/12 147.873 kg (326 lb)  07/04/11 154.223 kg (340 lb)     Intake/Output Summary  (Last 24 hours) at 02/07/13 1345 Last data filed at 02/07/13 1031  Gross per 24 hour  Intake    309 ml  Output   1025 ml  Net   -716 ml    Exam  General: Well developed, well nourished, NAD, appears stated age  HEENT: NCAT, PERRLA, EOMI, Anicteic Sclera, mucous membranes moist. No pharyngeal erythema or exudates  Neck: Supple, no JVD, no masses  Cardiovascular: S1 S2 auscultated, no rubs, murmurs or gallops. Regular rate and rhythm.  Respiratory: Clear to auscultation bilaterally with equal chest rise  Abdomen: Soft, obese, nontender, nondistended, + bowel sounds  Extremities: warm dry without cyanosis clubbing or edema  Neuro: AAOx3, cranial nerves grossly intact. Strength 5/5 in patient's upper and lower extremities bilaterally  Skin: Without rashes exudates or nodules  Psych: Normal affect and demeanor with intact judgement and insight  Data Review   Micro Results Recent Results (from the past 240 hour(s))  MRSA PCR SCREENING     Status: None   Collection Time    02/02/13 10:35 PM      Result Value Range Status   MRSA by PCR NEGATIVE  NEGATIVE Final   Comment:            The GeneXpert MRSA Assay (FDA     approved for NASAL specimens     only), is one component of a     comprehensive MRSA colonization     surveillance program. It is not     intended to diagnose MRSA     infection nor to guide or     monitor treatment for     MRSA infections.    Radiology Reports US Renal  02/02/2013   CLINICAL DATA:  Renal failure  EXAM: RENAL/URINARY TRACT ULTRASOUND COMPLETE  COMPARISON:  None.  FINDINGS: Right Kidney:  Length: 11.6 cm. Echogenic renal parenchyma, suggesting medical renal disease. 1.4 x 1.1 x 1.2 cm upper pole cyst. No hydronephrosis.  Left Kidney:  Length: 11.7 cm. Echogenic renal parenchyma, suggesting medical renal disease. 2.1 x 2.0 x 2.0 cm upper pole cyst. No hydronephrosis.  Bladder:  Decompressed by indwelling Foley catheter.  IMPRESSION: Echogenic  renal parenchyma, suggesting medical renal disease.  Bilateral renal cysts.  No hydronephrosis.   Electronically Signed   By: Charline Bills M.D.   On: 02/02/2013 20:59   Dg Chest Portable 1 View  02/02/2013   CLINICAL  DATA:  Weakness, presyncope  EXAM: PORTABLE CHEST - 1 VIEW  COMPARISON:  06/16/2011  FINDINGS: Mildly low lung volumes. There is mild elevation the right hemidiaphragm. Cardiac silhouette is within normal limits. There is no evidence of focal infiltrates, effusions, nor edema. Dextroscoliosis identified within the thoracic spine.  IMPRESSION: No evidence of acute cardiopulmonary disease.   Electronically Signed   By: Salome Holmes M.D.   On: 02/02/2013 17:34    CBC  Recent Labs Lab 02/02/13 1622 02/03/13 0359 02/04/13 0345 02/05/13 0525 02/07/13 0532  WBC 8.5 7.5 7.9 7.4 8.1  HGB 7.3* 7.1* 7.3* 8.1* 8.0*  HCT 23.7* 23.0* 22.8* 25.8* 25.8*  PLT 242 176 160 158 167  MCV 87.1 86.8 86.7 88.4 90.2  MCH 26.8 26.8 27.8 27.7 28.0  MCHC 30.8 30.9 32.0 31.4 31.0  RDW 20.5* 19.6* 19.2* 18.1* 18.8*  LYMPHSABS 1.3  --   --   --   --   MONOABS 0.8  --   --   --   --   EOSABS 0.2  --   --   --   --   BASOSABS 0.0  --   --   --   --     Chemistries   Recent Labs Lab 02/02/13 1622  02/03/13 0359 02/03/13 0825 02/04/13 0345 02/05/13 0525 02/06/13 0430 02/07/13 0532  NA 138  --  140  --  138 140 140 141  K 5.6*  < > 5.5*  5.5* 5.6* 5.0 4.4 4.9 4.2  CL 104  --  108  --  108 113* 112 112  CO2 21  --  19  --  17* 18* 18* 20  GLUCOSE 103*  --  88  --  79 84 90 105*  BUN 70*  --  64*  --  54* 33* 26* 20  CREATININE 7.83*  --  6.46*  --  4.68* 2.66* 2.28* 1.99*  CALCIUM 9.4  --  9.0  --  8.9 8.4 8.9 8.9  AST 8  --   --   --   --   --   --   --   ALT 9  --   --   --   --   --   --   --   ALKPHOS 111  --   --   --   --   --   --   --   BILITOT 0.4  --   --   --   --   --   --   --   < > = values in this interval not  displayed. ------------------------------------------------------------------------------------------------------------------ estimated creatinine clearance is 55.2 ml/min (by C-G formula based on Cr of 1.99). ------------------------------------------------------------------------------------------------------------------ No results found for this basename: HGBA1C,  in the last 72 hours ------------------------------------------------------------------------------------------------------------------ No results found for this basename: CHOL, HDL, LDLCALC, TRIG, CHOLHDL, LDLDIRECT,  in the last 72 hours ------------------------------------------------------------------------------------------------------------------ No results found for this basename: TSH, T4TOTAL, FREET3, T3FREE, THYROIDAB,  in the last 72 hours ------------------------------------------------------------------------------------------------------------------ No results found for this basename: VITAMINB12, FOLATE, FERRITIN, TIBC, IRON, RETICCTPCT,  in the last 72 hours  Coagulation profile No results found for this basename: INR, PROTIME,  in the last 168 hours  No results found for this basename: DDIMER,  in the last 72 hours  Cardiac Enzymes  Recent Labs Lab 02/02/13 1622  TROPONINI <0.30   ------------------------------------------------------------------------------------------------------------------ No components found with this basename: POCBNP,

## 2013-02-07 NOTE — Progress Notes (Signed)
Occupational Therapy Evaluation Patient Details Name: Larry [name] MRN: [id number] DOB: 1950-08-07 Today's Date: 02/07/2013 Time: 7253-6644 OT Time Calculation (min): 18 min  OT Assessment / Plan / Recommendation History of present illness Pt is a 62 y.o. male adm from home due to generalized weakness    Clinical Impression   Pt generally weak, but able to complete ADL. Wife available to assist as needed. Pt has all recommended DME. Discussed home safety and ways to reduce risks of falls. Given exercises to complete independently. No further OT needs at this time. OT signing off. Thank you.    OT Assessment  Patient does not need any further OT services    Follow Up Recommendations  No OT follow up    Barriers to Discharge      Equipment Recommendations  None recommended by OT    Recommendations for Other Services    Frequency       Precautions / Restrictions Precautions Precautions: Fall Precaution Comments: no recent falls; pt slightly unsteady due to weakness  Restrictions Weight Bearing Restrictions: No   Pertinent Vitals/Pain no apparent distress     ADL  Grooming: Set up Where Assessed - Grooming: Unsupported sitting Upper Body Bathing: Set up Where Assessed - Upper Body Bathing: Unsupported sitting Lower Body Bathing: Minimal assistance Where Assessed - Lower Body Bathing: Unsupported sit to stand Toilet Transfer: Supervision/safety Toileting - Clothing Manipulation and Hygiene: Modified independent Transfers/Ambulation Related to ADLs: S ADL Comments: Easily fatigues. States wife can assist as needed    OT Diagnosis:    OT Problem List:   OT Treatment Interventions:     OT Goals(Current goals can be found in the care plan section) Acute Rehab OT Goals Patient Stated Goal: to go home with wife when ready OT Goal Formulation:  (eval only)  Visit Information  Last OT Received On: 02/07/13 Assistance Needed: +1 History of Present Illness: Pt is  a 62 y.o. male adm from home due to generalized weakness        Prior Functioning     Home Living Family/patient expects to be discharged to:: Private residence Living Arrangements: Spouse/significant other Available Help at Discharge: Family;Available 24 hours/day Type of Home: House Home Access: Level entry Home Layout: Two level;Able to live on main level with bedroom/bathroom Home Equipment: Gilmer Mor - single point;Shower seat;Bedside commode;Walker - 2 wheels Additional Comments: pt was independent with all ADL's; has tub shower and reports he was able to stand and take shower; standard height commode Prior Function Level of Independence: Independent Comments: pt drives and is very independent; has been on disability since 2010 Communication Communication: No difficulties Dominant Hand: Right         Vision/Perception     Cognition  Cognition Arousal/Alertness: Awake/alert Behavior During Therapy: WFL for tasks assessed/performed Overall Cognitive Status: Within Functional Limits for tasks assessed    Extremity/Trunk Assessment Upper Extremity Assessment Upper Extremity Assessment: Overall WFL for tasks assessed Lower Extremity Assessment Lower Extremity Assessment: Defer to PT evaluation RLE Deficits / Details: grossly 3+/5; pt c/o upper thigh feeling "numb"; reports this is new RLE Sensation: decreased light touch Cervical / Trunk Assessment Cervical / Trunk Assessment: Normal     Mobility Bed Mobility Bed Mobility: Supine to Sit;Sitting - Scoot to Edge of Bed Supine to Sit: 6: Modified independent (Device/Increase time);HOB elevated;With rails Sitting - Scoot to Edge of Bed: 6: Modified independent (Device/Increase time) Details for Bed Mobility Assistance: pt required increased time due to generalized weakness; relied on  handrails; no physical (A) needed  Transfers Stand to Sit: 5: Supervision     Exercise Other Exercises Other Exercises: recommended  general UB and UB AROM tomaintain strength   Balance Balance Balance Assessed: Yes Static Sitting Balance Static Sitting - Balance Support: Feet supported;No upper extremity supported Static Sitting - Level of Assistance: 5: Stand by assistance Static Standing Balance Static Standing - Level of Assistance:  (min guard)   End of Session OT - End of Session Activity Tolerance: Patient tolerated treatment well Patient left: in bed;with call bell/phone within reach Nurse Communication: Mobility status  GO     Dotti Busey,HILLARY 02/07/2013, 3:36 PM [facility], OTR/L  [phone] 02/07/2013

## 2013-02-07 NOTE — Consult Note (Signed)
Reason for Consult: Heme positive stool and Anemia Referring Physician: [facility]  Jacorian Lonia Farber HPI: This is a 62 year old male who was admitted with a creatinine of 7.  He was not feeling well and over the intervening days his symptoms worsened.  He was encouraged by his family members to come to the hospital, but he resisted.  Since his admission his creatinine has markedly improved with IV hydration, but he was noted to have an iron deficiency anemia.  In fact, he was noted to have this issue in October by his PCP and he was referred to Dr. Marina Goodell at [facility].  The patient missed his appointment with Dr. Marina Goodell because of his weakness complaints.  [facility] was contacted yesterday, but they declined to see the patient as he was never seen in the office.  In 05/2011 his HGB was in the 13 range.  His admission HGB was in the 7 range and he denies any issues with hematochezia, melena, diarrhea, abdominal pain, NSAID use, or family history of colon cancer.  No prior history of a colonoscopy and there is no history of PUD.  His iron saturation was at 12% and he was noted to be heme positive.  As a result of his findings a GI consultation was requested.  Past Medical History  Diagnosis Date  . Hypertension   . Hypercholesteremia   . CHF (congestive heart failure)     has diastolic heart failure grade 1; EF is 45 to 50% per echo 05/2011  . Coronary artery disease   . COPD (chronic obstructive pulmonary disease)   . Asthma   . Obesity   . Noncompliance   . NSVT (nonsustained ventricular tachycardia)     beta blocker restarted  . Atrial tachycardia     managed on beta blocker therapy    Past Surgical History  Procedure Laterality Date  . Coronary stent placement      x2  . Cardiac catheterization  11/12/2008    stent x 2 to RCA    Family History  Problem Relation Age of Onset  . Hypertension Father   . Heart disease Father   . Diabetes Father   . Hypertension Mother     Social  History:  reports that he quit smoking about 4 years ago. His smoking use included Cigarettes. He smoked 0.00 packs per day. He has never used smokeless tobacco. He reports that he does not drink alcohol or use illicit drugs.  Allergies: No Known Allergies  Medications:  Scheduled: . carvedilol  25 mg Oral Daily  . cyanocobalamin  1,000 mcg Subcutaneous Daily  . diltiazem  240 mg Oral Daily  . docusate sodium  100 mg Oral BID  . [START ON 02/08/2013] folic acid  1 mg Oral Daily  . sodium chloride  3 mL Intravenous Q12H   Continuous:   Results for orders placed during the hospital encounter of 02/02/13 (from the past 24 hour(s))  BASIC METABOLIC PANEL     Status: Abnormal   Collection Time    02/07/13  5:32 AM      Result Value Range   Sodium 141  135 - 145 mEq/L   Potassium 4.2  3.5 - 5.1 mEq/L   Chloride 112  96 - 112 mEq/L   CO2 20  19 - 32 mEq/L   Glucose, Bld 105 (*) 70 - 99 mg/dL   BUN 20  6 - 23 mg/dL   Creatinine, Ser 1.61 (*) 0.50 - 1.35 mg/dL  Calcium 8.9  8.4 - 10.5 mg/dL   GFR calc non Af Amer 35 (*) >90 mL/min   GFR calc Af Amer 41 (*) >90 mL/min  CBC     Status: Abnormal   Collection Time    02/07/13  5:32 AM      Result Value Range   WBC 8.1  4.0 - 10.5 K/uL   RBC 2.86 (*) 4.22 - 5.81 MIL/uL   Hemoglobin 8.0 (*) 13.0 - 17.0 g/dL   HCT 16.1 (*) 09.6 - 04.5 %   MCV 90.2  78.0 - 100.0 fL   MCH 28.0  26.0 - 34.0 pg   MCHC 31.0  30.0 - 36.0 g/dL   RDW 40.9 (*) 81.1 - 91.4 %   Platelets 167  150 - 400 K/uL     No results found.  ROS:  As stated above in the HPI otherwise negative.  Blood pressure 114/71, pulse 77, temperature 98.2 F (36.8 C), temperature source Oral, resp. rate 18, height 5' 10.5" (1.791 m), weight 293 lb 3.4 oz (133 kg), SpO2 95.00%.    PE: Gen: NAD, Alert and Oriented HEENT:  [place]/AT, EOMI Neck: Supple, no LAD Lungs: CTA Bilaterally CV: RRR without M/G/R ABM: Soft, NTND, +BS Ext: No C/C/E  Assessment/Plan: 1) IDA. 2) Heme  positive stool.   The patient requires further evaluation.  His creatinine has improved and it appears to be a pre-renal etiology, however, there is no clear identification of volume loss.    Plan: 1) EGD/Colonoscopy tomorrow.  Krishawn Vanderweele D 02/07/2013, 12:44 PM

## 2013-02-08 ENCOUNTER — Encounter (HOSPITAL_COMMUNITY): Payer: Self-pay | Admitting: *Deleted

## 2013-02-08 ENCOUNTER — Encounter (HOSPITAL_COMMUNITY)
Admission: EM | Disposition: A | Discharge status: Rehabilitation Facility | Payer: Self-pay | Source: Home / Self Care | Attending: Internal Medicine

## 2013-02-08 ENCOUNTER — Inpatient Hospital Stay (HOSPITAL_COMMUNITY): Payer: Medicare Other

## 2013-02-08 DIAGNOSIS — D490 Neoplasm of unspecified behavior of digestive system: Secondary | ICD-10-CM

## 2013-02-08 HISTORY — PX: ESOPHAGOGASTRODUODENOSCOPY: SHX5428

## 2013-02-08 HISTORY — PX: COLONOSCOPY: SHX5424

## 2013-02-08 LAB — CBC
HCT: 28.3 % — ABNORMAL LOW (ref 39.0–52.0)
Hemoglobin: 8.7 g/dL — ABNORMAL LOW (ref 13.0–17.0)
MCH: 28.2 pg (ref 26.0–34.0)
MCV: 91.6 fL (ref 78.0–100.0)
RBC: 3.09 MIL/uL — ABNORMAL LOW (ref 4.22–5.81)
WBC: 7.8 10*3/uL (ref 4.0–10.5)

## 2013-02-08 LAB — BASIC METABOLIC PANEL
CO2: 19 mEq/L (ref 19–32)
Calcium: 9.2 mg/dL (ref 8.4–10.5)
Chloride: 112 mEq/L (ref 96–112)
Creatinine, Ser: 1.75 mg/dL — ABNORMAL HIGH (ref 0.50–1.35)
Glucose, Bld: 82 mg/dL (ref 70–99)
Sodium: 141 mEq/L (ref 135–145)

## 2013-02-08 SURGERY — COLONOSCOPY
Anesthesia: Moderate Sedation

## 2013-02-08 MED ORDER — IOHEXOL 300 MG/ML  SOLN
25.0000 mL | INTRAMUSCULAR | Status: AC
  Administered 2013-02-08 (×2): 25 mL via ORAL

## 2013-02-08 MED ORDER — FENTANYL CITRATE 0.05 MG/ML IJ SOLN
INTRAMUSCULAR | Status: AC
  Filled 2013-02-08: qty 4

## 2013-02-08 MED ORDER — SPOT INK MARKER SYRINGE KIT
PACK | SUBMUCOSAL | Status: DC | PRN
  Administered 2013-02-08: 4 mL via SUBMUCOSAL

## 2013-02-08 MED ORDER — MIDAZOLAM HCL 10 MG/2ML IJ SOLN
INTRAMUSCULAR | Status: DC | PRN
  Administered 2013-02-08 (×3): 2 mg via INTRAVENOUS
  Administered 2013-02-08: 1 mg via INTRAVENOUS
  Administered 2013-02-08: 2 mg via INTRAVENOUS

## 2013-02-08 MED ORDER — MIDAZOLAM HCL 5 MG/ML IJ SOLN
INTRAMUSCULAR | Status: AC
  Filled 2013-02-08: qty 3

## 2013-02-08 MED ORDER — DIPHENHYDRAMINE HCL 50 MG/ML IJ SOLN
INTRAMUSCULAR | Status: AC
  Filled 2013-02-08: qty 1

## 2013-02-08 MED ORDER — IOHEXOL 300 MG/ML  SOLN
100.0000 mL | Freq: Once | INTRAMUSCULAR | Status: AC | PRN
  Administered 2013-02-08: 100 mL via INTRAVENOUS

## 2013-02-08 MED ORDER — FENTANYL CITRATE 0.05 MG/ML IJ SOLN
INTRAMUSCULAR | Status: DC | PRN
  Administered 2013-02-08 (×4): 25 ug via INTRAVENOUS

## 2013-02-08 NOTE — Progress Notes (Signed)
[facility]                                                                                Patient Demographics  Larry [name], is a 62 y.o. male, DOB - Jun 09, 1950, ZOX:[id number]  Admit date - 02/02/2013   Admitting Physician Larry Bow, DO  Outpatient Primary MD for the patient is No primary provider on file.  LOS - 6   Chief Complaint  Patient presents with  . Weakness  . Near Syncope        Assessment & Plan   Principal Problem:   Acute renal failure Active Problems:   CARDIOMYOPATHY, ISCHEMIC   Hypotension   Chronic blood loss anemia   Atrial fibrillation  Severe acute renal failure  -Improving, creatinine 1.75 -Renal ultrasound showed no hydronephrosis -May be secondary to medications versus dehydration  Atrial Tachycardia  -rates have been up to 130's at rest, currently controlled  -Continue Coreg and Cardizem  Hypotension  -Likely secondary to hypovolemia  -Resolved   Normocytic anemia  -H/H 8.7/28.3, stable, however patient has had dark stools and has had POCT positive -Multifactorial: GI related vs renal disease -Patient was supposed to see Larry [name] as an outpatient however, this was cancelled due to illness.   -Iron studies show Iron def anemia -Patient has received 4units PRBCs this admission -EGD/Colonoscopy planned for today.  Pending those results.  B12 and folate deficiency  -B12 level Oct 2014 was quit low at 251 - repeated this admit 393 but given anemia, received loading dose -Folate low so was given IV and now continues po   Hyperlipidemia -Hold treatment until volume status more stable   Hx of diastolic CHF as well as ischemic CM/systolic CHF  -EF 45-50% via cath 2013  -care with volume resuscitation as patient will be a risk for rapidly converting to volume overload   COPD  -Well compensated at present and not requiring any medication -Has Xopenex when necessary  Hx of CAD s/p PTCA w/ stents x 2 2010   -Asymptomatic (see above)   Obesity - Body mass index is 41.46 kg/(m^2).  Nausea -Will add Zofran when necessary  Code Status: Full  Family Communication: None at this time. Attempted to contact wife.  Disposition Plan: Admitted.   Procedures  EGD Colonoscopy  Consults   Gastroenterology  DVT Prophylaxis  SCDs  Lab Results  Component Value Date   PLT 167 02/08/2013    Medications  Scheduled Meds: . carvedilol  25 mg Oral Daily  . cyanocobalamin  1,000 mcg Subcutaneous Daily  . diltiazem  240 mg Oral Daily  . docusate sodium  100 mg Oral BID  . folic acid  1 mg Oral Daily  . sodium chloride  3 mL Intravenous Q12H   Continuous Infusions:   PRN Meds:.levalbuterol, ondansetron (ZOFRAN) IV  Antibiotics    Anti-infectives   None     Time Spent in minutes   25 minutes   Larry [name] D.O. on 02/08/2013 at 11:13 AM  Between 7am to 7pm - Pager - [phone]  After 7pm go to [email or web address] - password [email or web address]  And look for the night coverage person covering for me after hours  [facility] Office  [phone]    Subjective:   Larry [name] seen and examined today.  Patient has no complaints.  Is anxious about the results of his colonoscopy and CT.    Objective:   Filed Vitals:   02/08/13 0821 02/08/13 0825 02/08/13 0830 02/08/13 0928  BP: 125/76 137/77 135/78 131/93  Pulse: 78 81 80 79  Temp: 98.9 F (37.2 C)   98.4 F (36.9 C)  TempSrc: Oral   Oral  Resp: 16 16 17 18   Height:      Weight:      SpO2: 100% 98% 97% 96%    Wt Readings from Last 3 Encounters:  02/07/13 133.5 kg (294 lb 5 oz)  02/07/13 133.5 kg (294 lb 5 oz)  12/06/12 147.873 kg (326 lb)     Intake/Output Summary (Last 24 hours) at 02/08/13 1113 Last data filed at 02/08/13 0900  Gross per 24 hour  Intake 748.67 ml  Output    352 ml  Net 396.67 ml    Exam  General: Well developed, well nourished, NAD, appears stated age  HEENT: NCAT, PERRLA, EOMI,  Anicteic Sclera, mucous membranes moist. No pharyngeal erythema or exudates  Neck: Supple, no JVD, no masses  Cardiovascular: S1 S2 auscultated, no rubs, murmurs or gallops. Regular rate and rhythm.  Respiratory: Clear to auscultation bilaterally with equal chest rise  Abdomen: Soft, obese, nontender, nondistended, + bowel sounds  Extremities: warm dry without cyanosis clubbing or edema  Neuro: AAOx3, cranial nerves grossly intact. Strength 5/5 in patient's upper and lower extremities bilaterally  Skin: Without rashes exudates or nodules  Psych: Normal affect and demeanor with intact judgement and insight  Data Review   Micro Results Recent Results (from the past 240 hour(s))  MRSA PCR SCREENING     Status: None   Collection Time    02/02/13 10:35 PM      Result Value Range Status   MRSA by PCR NEGATIVE  NEGATIVE Final   Comment:            The GeneXpert MRSA Assay (FDA     approved for NASAL specimens     only), is one component of a     comprehensive MRSA colonization     surveillance program. It is not     intended to diagnose MRSA     infection nor to guide or     monitor treatment for     MRSA infections.    Radiology Reports US Renal  02/02/2013   CLINICAL DATA:  Renal failure  EXAM: RENAL/URINARY TRACT ULTRASOUND COMPLETE  COMPARISON:  None.  FINDINGS: Right Kidney:  Length: 11.6 cm. Echogenic renal parenchyma, suggesting medical renal disease. 1.4 x 1.1 x 1.2 cm upper pole cyst. No hydronephrosis.  Left Kidney:  Length: 11.7 cm. Echogenic renal parenchyma, suggesting medical renal disease. 2.1 x 2.0 x 2.0 cm upper pole cyst. No hydronephrosis.  Bladder:  Decompressed by indwelling Foley catheter.  IMPRESSION: Echogenic renal parenchyma, suggesting medical renal disease.  Bilateral renal cysts.  No hydronephrosis.   Electronically Signed   By: Larry [name] M.D.   On: 02/02/2013 20:59   Dg Chest Portable 1 View  02/02/2013   CLINICAL DATA:  Weakness,  presyncope  EXAM: PORTABLE CHEST - 1 VIEW  COMPARISON:  06/16/2011  FINDINGS: Mildly low lung volumes. There is mild elevation the right hemidiaphragm. Cardiac silhouette is within normal limits. There is no evidence of focal infiltrates, effusions, nor edema. Dextroscoliosis identified within  the thoracic spine.  IMPRESSION: No evidence of acute cardiopulmonary disease.   Electronically Signed   By: Salome Holmes M.D.   On: 02/02/2013 17:34    CBC  Recent Labs Lab 02/02/13 1622 02/03/13 0359 02/04/13 0345 02/05/13 0525 02/07/13 0532 02/08/13 0540  WBC 8.5 7.5 7.9 7.4 8.1 7.8  HGB 7.3* 7.1* 7.3* 8.1* 8.0* 8.7*  HCT 23.7* 23.0* 22.8* 25.8* 25.8* 28.3*  PLT 242 176 160 158 167 167  MCV 87.1 86.8 86.7 88.4 90.2 91.6  MCH 26.8 26.8 27.8 27.7 28.0 28.2  MCHC 30.8 30.9 32.0 31.4 31.0 30.7  RDW 20.5* 19.6* 19.2* 18.1* 18.8* 18.9*  LYMPHSABS 1.3  --   --   --   --   --   MONOABS 0.8  --   --   --   --   --   EOSABS 0.2  --   --   --   --   --   BASOSABS 0.0  --   --   --   --   --     Chemistries   Recent Labs Lab 02/02/13 1622  02/04/13 0345 02/05/13 0525 02/06/13 0430 02/07/13 0532 02/08/13 0540  NA 138  < > 138 140 140 141 141  K 5.6*  < > 5.0 4.4 4.9 4.2 4.7  CL 104  < > 108 113* 112 112 112  CO2 21  < > 17* 18* 18* 20 19  GLUCOSE 103*  < > 79 84 90 105* 82  BUN 70*  < > 54* 33* 26* 20 16  CREATININE 7.83*  < > 4.68* 2.66* 2.28* 1.99* 1.75*  CALCIUM 9.4  < > 8.9 8.4 8.9 8.9 9.2  AST 8  --   --   --   --   --   --   ALT 9  --   --   --   --   --   --   ALKPHOS 111  --   --   --   --   --   --   BILITOT 0.4  --   --   --   --   --   --   < > = values in this interval not displayed. ------------------------------------------------------------------------------------------------------------------ estimated creatinine clearance is 62.9 ml/min (by C-G formula based on Cr of  1.75). ------------------------------------------------------------------------------------------------------------------ No results found for this basename: HGBA1C,  in the last 72 hours ------------------------------------------------------------------------------------------------------------------ No results found for this basename: CHOL, HDL, LDLCALC, TRIG, CHOLHDL, LDLDIRECT,  in the last 72 hours ------------------------------------------------------------------------------------------------------------------ No results found for this basename: TSH, T4TOTAL, FREET3, T3FREE, THYROIDAB,  in the last 72 hours ------------------------------------------------------------------------------------------------------------------ No results found for this basename: VITAMINB12, FOLATE, FERRITIN, TIBC, IRON, RETICCTPCT,  in the last 72 hours  Coagulation profile No results found for this basename: INR, PROTIME,  in the last 168 hours  No results found for this basename: DDIMER,  in the last 72 hours  Cardiac Enzymes  Recent Labs Lab 02/02/13 1622  TROPONINI <0.30   ------------------------------------------------------------------------------------------------------------------ No components found with this basename: POCBNP,

## 2013-02-08 NOTE — Progress Notes (Signed)
PT Cancellation Note  Patient Details Name: Larry [name] MRN: [id number] DOB: 1951-01-11   Cancelled Treatment:     pt off floor at this time for surgery. Will re-attempt at next available time.    Donnamarie Poag [place], [place] [phone] 02/08/2013, 7:59 AM

## 2013-02-08 NOTE — Op Note (Signed)
Larry [name] [facility] [address] [place], [address]   OPERATIVE PROCEDURE REPORT  PATIENT: Larry [name], Larry [name]  MR#: [id number] BIRTHDATE: 14-Jul-1950  GENDER: Male ENDOSCOPIST: Jeani Hawking, MD ASSISTANT:   [name], technician Dwain Sarna, RN [facility] Harold Barban, RN PROCEDURE DATE: 02/08/2013 PROCEDURE:   Colonoscopy with cold biopsy and injection of SPOT  ASA CLASS:   Class III INDICATIONS:IDA MEDICATIONS: See the EGD report  DESCRIPTION OF PROCEDURE:   After the risks benefits and alternatives of the procedure were thoroughly explained, informed consent was obtained.  A digital rectal exam revealed no abnormalities of the rectum.    The Pentax Adult Colon [id number] endoscope was introduced through the anus  and advanced to the transverse colon , No adverse events experienced.    The quality of the prep was good. .  The instrument was then slowly withdrawn as the colon was fully examined.     FINDINGS: A large circumfirential obstructing mass was identified in the colon.  I do not know the exact location, but I presume that it is in the transverse colon.  Attempts were made to traverse the lesion, but this was not possilbe.  The edges of the mass were rolled and friable and multiple cold biopsies were obtained.  This area was friable.  The canal portion of the mass was ulcerated and hard.  The distal portion of the mass was tattooed with SPOT. Scattered left sided diverticula were identified.          The scope was then withdrawn from the patient and the procedure terminated.  COMPLICATIONS: There were no complications.  IMPRESSION: 1) Obstructing colon mass.  / transverse colon. 2) Diverticula. 3) Int/Ext hemorrhoids.  RECOMMENDATIONS: 1) CT scan of the Chest/ABM/Pelvis. 2) Check CEA. 3) Await biopsy results. 4) Surgical consultation.  _______________________________ eSignedJeani Hawking, MD 02/08/2013 8:45 AM   PATIENT NAME:   Larry [name], Larry [name] MR#: [id number]

## 2013-02-08 NOTE — Progress Notes (Signed)
Physical Therapy Treatment Patient Details Name: Larry [name] MRN: [id number] DOB: 07-18-1950 Today's Date: 02/08/2013 Time: 7253-6644 PT Time Calculation (min): 11 min  PT Assessment / Plan / Recommendation  History of Present Illness Pt is a 62 y.o. male adm from home due to generalized weakness    PT Comments   Pt with limited participation today in therapy. Wife attributes this to his news about a newly found mass in his colon. Pt unsteady today with gt when ambulating to/from bathroom. May need to assess for AD next session. Pt encouraged to continue OOB activities to limit risk of blood clots and to reduce deconditioning. Pt verbalized understanding but was still only agreeable to ambulating to/from bathroom. Will cont to follow per POC.  Follow Up Recommendations  Supervision - Intermittent;Supervision for mobility/OOB;Outpatient PT     Does the patient have the potential to tolerate intense rehabilitation     Barriers to Discharge        Equipment Recommendations  Rolling walker with 5" wheels;Cane;Other (comment) (TBD )    Recommendations for Other Services OT consult  Frequency Min 3X/week   Progress towards PT Goals Progress towards PT goals: Progressing toward goals  Plan Current plan remains appropriate    Precautions / Restrictions Precautions Precautions: Fall Precaution Comments: no recent falls; pt slightly unsteady due to weakness  Restrictions Weight Bearing Restrictions: No   Pertinent Vitals/Pain No c/o pain or new complaints today.     Mobility  Bed Mobility Bed Mobility: Supine to Sit;Sitting - Scoot to Edge of Bed;Sit to Supine Supine to Sit: 6: Modified independent (Device/Increase time);HOB flat;With rails Sitting - Scoot to Edge of Bed: 6: Modified independent (Device/Increase time) Sit to Supine: 6: Modified independent (Device/Increase time);HOB flat;With rail Details for Bed Mobility Assistance: pt requires incr time but able to  complete bed mobility with use of handrails  Transfers Transfers: Sit to Stand;Stand to Sit Sit to Stand: 4: Min assist;From bed;With upper extremity assist Stand to Sit: 4: Min guard;To toilet;To bed;With upper extremity assist Details for Transfer Assistance: pt unsteady with initial sit to stand; demo LOB initially and recovered with (A) and bracing LEs on bed; pt has difficulty rising from low positioned bed due to generalized LE weakness; cues for hand placement and sequencing; requires use of handicap rail in bathroom to control descent to low surface toilet Ambulation/Gait Ambulation/Gait Assistance: 4: Min assist Ambulation Distance (Feet): 24 Feet (12 x 2 ) Assistive device: 1 person hand held assist Ambulation/Gait Assistance Details: pt more unsteady today with ambulation; pt was reaching for UE support when ambulating to bathroom and had LOB posteriorly which required (A) to maintain balance; pt may benefit from least resistive AD to increase stability with gt; pt did not wish to ambulate outside of room today Gait Pattern: Wide base of support;Decreased stride length Gait velocity: decreased Stairs: No Wheelchair Mobility Wheelchair Mobility: No         PT Diagnosis:    PT Problem List:   PT Treatment Interventions:     PT Goals (current goals can now be found in the care plan section) Acute Rehab PT Goals Patient Stated Goal: to have surgery next week and go home  PT Goal Formulation: With patient Time For Goal Achievement: 02/21/13 Potential to Achieve Goals: Good  Visit Information  Last PT Received On: 02/08/13 Assistance Needed: +1 History of Present Illness: Pt is a 62 y.o. male adm from home due to generalized weakness     Subjective Data  Subjective: pt stated " we arent going to do that walking but we can go to the bathroom. its been a rough day"  Patient Stated Goal: to have surgery next week and go home    Cognition  Cognition Arousal/Alertness:  Awake/alert Behavior During Therapy: WFL for tasks assessed/performed Overall Cognitive Status: Within Functional Limits for tasks assessed    Balance  Balance Balance Assessed: Yes Dynamic Standing Balance Dynamic Standing - Balance Support: Left upper extremity supported;Right upper extremity supported;During functional activity Dynamic Standing - Level of Assistance: Other (comment) (min guard) Dynamic Standing - Balance Activities: Forward lean/weight shifting;Lateral lean/weight shifting  End of Session PT - End of Session Equipment Utilized During Treatment: Gait belt Activity Tolerance: Patient limited by fatigue Patient left: in bed;with call bell/phone within reach;with family/visitor present Nurse Communication: Mobility status;Precautions   GP     Donell Sievert, [place] [phone] 02/08/2013, 3:47 PM

## 2013-02-08 NOTE — Op Note (Signed)
[facility] [address] [place], [address]   OPERATIVE PROCEDURE REPORT  PATIENT: Larry, [name]  MR#: [id number] BIRTHDATE: 18-Sep-1950  GENDER: Male ENDOSCOPIST: Jeani Hawking, MD ASSISTANT:   [name], technician, Dwain Sarna, RN CGRN, and Harold Barban, RN PROCEDURE DATE: 02/08/2013 PROCEDURE:   EGD w/ biopsy ASA CLASS:   Class III INDICATIONS:IDA. MEDICATIONS: Versed 6 mg IV and Fentanyl 50 mcg IV TOPICAL ANESTHETIC:   none  DESCRIPTION OF PROCEDURE:   After the risks benefits and alternatives of the procedure were thoroughly explained, informed consent was obtained.  The PENTAX GASTOROSCOPE [id number]  endoscope was introduced through the mouth  and advanced to the second portion of the duodenum Without limitations.      The instrument was slowly withdrawn as the mucosa was fully examined.      FINDINGS: The esophagus was normal.  In the gastric antrum multiple nonbleeding erosions were identified.  Multiple cold biopsies were obtained.  A mild duodenitis was noted in the duodenal bulb.  No evidence of any ulcers or vascular abnormalities.          The scope was then withdrawn from the patient and the procedure terminated.  COMPLICATIONS: There were no complications. IMPRESSION: 1) Antral erosions. 2) Duodenitis.  RECOMMENDATIONS: 1) Await biopsy results. 2) PPI QD.  _______________________________ Rosalie DoctorJeani Hawking, MD 02/08/2013 7:55 AM

## 2013-02-08 NOTE — Consult Note (Signed)
Patient examined and I agree with the assessment and plan Needs cardiac evaluation pre-operatively. Will plan partial colectomy next week. I D/W him and his wife. Violeta Gelinas, MD, MPH, FACS Pager: [phone]  02/08/2013 3:04 PM

## 2013-02-08 NOTE — Interval H&P Note (Signed)
History and Physical Interval Note:  02/08/2013 7:39 AM  Larry [name]  has presented today for surgery, with the diagnosis of Heme positive stool and anemia  The various methods of treatment have been discussed with the patient and family. After consideration of risks, benefits and other options for treatment, the patient has consented to  Procedure(s): COLONOSCOPY (N/A) ESOPHAGOGASTRODUODENOSCOPY (EGD) (N/A) as a surgical intervention .  The patient's history has been reviewed, patient examined, no change in status, stable for surgery.  I have reviewed the patient's chart and labs.  Questions were answered to the patient's satisfaction.     Adell Panek D

## 2013-02-08 NOTE — Consult Note (Signed)
Reason for Consult: colon mass and anemia Referring Physician: Dr. Jeani Hawking PCP: none  Cardiology:  Dr Johney Frame    Larry [name] is an 62 y.o. male.  HPI: Pt is a 62 y/o who has been sick for 5 weeks according to him and his wife.  He thought he had a cold.  He could take liquids and some crackers, but wasn't hungry and didn't eat much.  He saw his PCP and was referred to GI at [facility] for anemia.  He never made the appointment..  He has become progressively weaker and feeling bad.  He has not had a BM for 2 weeks as best his wife can tell. He was down to some liquids, refusing to go the doctor thinking he would be better.  His wife said he didn't do anything but sleep, he was weak and SOB just walking to BR in the house. The week prior to admission he wasn't even drinking very much, refusing to see anyone.   She finally called EMS and said he was going.   On arrival here at [facility] his hemoglobin was 7.3, and his creatinine was 7.83.  He was admitted for Acute renal failure, and anemia.  Work up including colonoscopy shows obstructing mass today.  Dr. Elnoria Howard said he could not get the scope beyond the mass. His creatinine is now down to 1.75, and his H/H is 8.7/28.3.   We were ask to see.  Past Medical History  Diagnosis Date  . Hypertension   . Hypercholesteremia   . CHF (congestive heart failure)     has diastolic heart failure grade 1; EF is 45 to 50% per echo 05/2011  . Coronary artery disease    ETOH use up to 2 (1/5ths per week)  Quit in 2010 with stent to RCA.   [name] Kitchen COPD (chronic obstructive pulmonary disease)/Tobacco use for 39 years 1PPD, quit 2010 with stents.   . Asthma   . Obesity Body mass index is 41.62 kg/(m^2).    [name] Kitchen Noncompliance   . NSVT (nonsustained ventricular tachycardia)     beta blocker restarted  . Atrial tachycardia     managed on beta blocker therapy    Past Surgical History  Procedure Laterality Date  . Coronary stent placement      x2  . Cardiac  catheterization  11/12/2008    stent x 2 to RCA    Family History  Problem Relation Age of Onset  . Hypertension Father   . Heart disease Father   . Diabetes Father   . Hypertension Mother     Social History:  reports that he quit smoking about 4 years ago. His smoking use included Cigarettes. He smoked 0.00 packs per day. He has never used smokeless tobacco. He reports that he does not drink alcohol or use illicit drugs.  Allergies: No Known Allergies Prior to Admission medications   Medication Sig Start Date End Date Taking? Authorizing Provider  aspirin EC 325 MG tablet Take 325 mg by mouth daily.   Yes Historical Provider, MD  carvedilol (COREG) 25 MG tablet Take 25 mg by mouth daily.   Yes Historical Provider, MD  clopidogrel (PLAVIX) 75 MG tablet Take 75 mg by mouth daily with breakfast.   Yes Historical Provider, MD  diltiazem (CARDIZEM CD) 240 MG 24 hr capsule Take 240 mg by mouth daily.   Yes Historical Provider, MD  furosemide (LASIX) 40 MG tablet Take 40 mg by mouth 2 (two) times daily.  Yes Historical Provider, MD  levalbuterol [facility] HFA) 45 MCG/ACT inhaler Inhale 1-2 puffs into the lungs every 4 (four) hours as needed for wheezing.   Yes Historical Provider, MD  lisinopril (PRINIVIL,ZESTRIL) 20 MG tablet Take 20 mg by mouth daily.   Yes Historical Provider, MD  nitroGLYCERIN (NITROSTAT) 0.4 MG SL tablet Place 0.4 mg under the tongue every 5 (five) minutes as needed for chest pain.   Yes Historical Provider, MD  potassium chloride SA (K-DUR,KLOR-CON) 20 MEQ tablet Take 20 mEq by mouth daily.   Yes Historical Provider, MD  rosuvastatin (CRESTOR) 40 MG tablet Take 40 mg by mouth daily.   Yes Historical Provider, MD    Medications:  Scheduled: . carvedilol  25 mg Oral Daily  . cyanocobalamin  1,000 mcg Subcutaneous Daily  . diltiazem  240 mg Oral Daily  . docusate sodium  100 mg Oral BID  . folic acid  1 mg Oral Daily  . sodium chloride  3 mL Intravenous Q12H    Continuous:  XBM:[id number], ondansetron (ZOFRAN) IV  Results for orders placed during the hospital encounter of 02/02/13 (from the past 48 hour(s))  BASIC METABOLIC PANEL     Status: Abnormal   Collection Time    02/07/13  5:32 AM      Result Value Range   Sodium 141  135 - 145 mEq/L   Potassium 4.2  3.5 - 5.1 mEq/L   Chloride 112  96 - 112 mEq/L   CO2 20  19 - 32 mEq/L   Glucose, Bld 105 (*) 70 - 99 mg/dL   BUN 20  6 - 23 mg/dL   Creatinine, Ser 2.53 (*) 0.50 - 1.35 mg/dL   Calcium 8.9  8.4 - 66.4 mg/dL   GFR calc non Af Amer 35 (*) >90 mL/min   GFR calc Af Amer 41 (*) >90 mL/min   Comment: (NOTE)     The eGFR has been calculated using the CKD EPI equation.     This calculation has not been validated in all clinical situations.     eGFR's persistently <90 mL/min signify possible Chronic Kidney     Disease.  CBC     Status: Abnormal   Collection Time    02/07/13  5:32 AM      Result Value Range   WBC 8.1  4.0 - 10.5 K/uL   RBC 2.86 (*) 4.22 - 5.81 MIL/uL   Hemoglobin 8.0 (*) 13.0 - 17.0 g/dL   HCT 40.3 (*) 47.4 - 25.9 %   MCV 90.2  78.0 - 100.0 fL   MCH 28.0  26.0 - 34.0 pg   MCHC 31.0  30.0 - 36.0 g/dL   RDW 56.3 (*) 87.5 - 64.3 %   Platelets 167  150 - 400 K/uL  CBC     Status: Abnormal   Collection Time    02/08/13  5:40 AM      Result Value Range   WBC 7.8  4.0 - 10.5 K/uL   RBC 3.09 (*) 4.22 - 5.81 MIL/uL   Hemoglobin 8.7 (*) 13.0 - 17.0 g/dL   HCT 32.9 (*) 51.8 - 84.1 %   MCV 91.6  78.0 - 100.0 fL   MCH 28.2  26.0 - 34.0 pg   MCHC 30.7  30.0 - 36.0 g/dL   RDW 66.0 (*) 63.0 - 16.0 %   Platelets 167  150 - 400 K/uL  BASIC METABOLIC PANEL     Status: Abnormal   Collection Time  02/08/13  5:40 AM      Result Value Range   Sodium 141  135 - 145 mEq/L   Potassium 4.7  3.5 - 5.1 mEq/L   Chloride 112  96 - 112 mEq/L   CO2 19  19 - 32 mEq/L   Glucose, Bld 82  70 - 99 mg/dL   BUN 16  6 - 23 mg/dL   Creatinine, Ser 4.54 (*) 0.50 - 1.35 mg/dL   Calcium  9.2  8.4 - 09.8 mg/dL   GFR calc non Af Amer 41 (*) >90 mL/min   GFR calc Af Amer 47 (*) >90 mL/min   Comment: (NOTE)     The eGFR has been calculated using the CKD EPI equation.     This calculation has not been validated in all clinical situations.     eGFR's persistently <90 mL/min signify possible Chronic Kidney     Disease.    No results found.  Review of Systems  Constitutional: Positive for weight loss (he isn't sure 10-12 pounds last 5 weeks.) and malaise/fatigue. Negative for fever, chills and diaphoresis.  HENT: Negative.   Eyes: Negative.   Respiratory: Positive for cough, shortness of breath (walking to BR at home and back to Bedroom) and wheezing. Negative for hemoptysis and sputum production.        He and wife are not aware of sleep apnea.  Cardiovascular: Positive for orthopnea, leg swelling and PND. Negative for chest pain, palpitations (he says he never feels his heart beating fast.) and claudication.  Gastrointestinal: Positive for constipation (no BM for 2 weeks, I was not able to get a history of stool change.). Negative for nausea, vomiting, abdominal pain, diarrhea, blood in stool and melena.  Genitourinary: Negative.   Musculoskeletal: Negative.   Skin:       Chronic dry skin especially lower legs and feet.  Neurological: Positive for weakness.  Endo/Heme/Allergies: Negative.    Blood pressure 131/93, pulse 79, temperature 98.4 F (36.9 C), temperature source Oral, resp. rate 18, height 5' 10.5" (1.791 m), weight 133.5 kg (294 lb 5 oz), SpO2 96.00%. Physical Exam  Constitutional: He is oriented to person, place, and time. He appears well-developed. No distress.  Morbidly obese Body mass index is 41.62 kg/(m^2). NAD, but he has trouble just sitting up in bed.  HENT:  Head: Normocephalic and atraumatic.  Nose: Nose normal.  Eyes: Conjunctivae and EOM are normal. Pupils are equal, round, and reactive to light. Right eye exhibits no discharge. Left eye exhibits  no discharge. No scleral icterus.  Neck: Normal range of motion. Neck supple. No JVD present. No tracheal deviation present. No thyromegaly present.  Cardiovascular: Normal rate, regular rhythm, normal heart sounds and intact distal pulses.  Exam reveals no gallop.   No murmur heard. Respiratory: Effort normal and breath sounds normal. No respiratory distress. He has no wheezes. He has no rales. He exhibits no tenderness.  GI: He exhibits distension. He exhibits no mass. There is no tenderness. There is no rebound and no guarding.  He is markedly distended, hyperactive BS present.   Musculoskeletal: He exhibits edema (trace lower legs).  Lymphadenopathy:    He has no cervical adenopathy.  Neurological: He is alert and oriented to person, place, and time. No cranial nerve deficit.  Skin: Skin is warm and dry. No rash noted. He is not diaphoretic. No erythema. No pallor.  Dry skin both lower legs, and feet.  Psychiatric: He has a normal mood and affect. His behavior  is normal. Judgment and thought content normal.    Assessment/Plan: 1. Obstructing colon mass. (No BM for 2 weeks) 2.  Acute renal failure 3.  Anemia with GI blood loss. 4.  CAD with CM; EF 345-50% 05/2011. 5.  Hx of 2 RCA stents 2010./hx of atrial tachycardia/NSVT on BB and CCB's. 6.  COPD/tobacco use Hx. 7.  Hx of ETOH heavy use 20+ years, quit 2010. 8.  Obesity Body mass index is 41.6. 9.  Dyslipidemia 10.  Deconditioning/malnutrition, he has been inactive for 5 weeks  Plan:  CT scans are currently pending.  He needs cardiology clearance for surgery.  It would be good to get some nutrition into him and try to get him moving before he has surgery.  I will recheck LFT'S, PREALBUMIN in the AM.  Ask PT to see and help with ambulation.  We will defer to Medicine for hydration, and medical clearaance    Larry [name] 02/08/2013, 12:57 PM

## 2013-02-08 NOTE — H&P (View-Only) (Signed)
Reason for Consult: Heme positive stool and Anemia Referring Physician: [facility]  Larry [name] HPI: This is a 61 year old male who was admitted with a creatinine of 7.  He was not feeling well and over the intervening days his symptoms worsened.  He was encouraged by his family members to come to the hospital, but he resisted.  Since his admission his creatinine has markedly improved with IV hydration, but he was noted to have an iron deficiency anemia.  In fact, he was noted to have this issue in October by his PCP and he was referred to Larry [name] at [place].  The patient missed his appointment with Larry [name] because of his weakness complaints.  [facility] was contacted yesterday, but they declined to see the patient as he was never seen in the office.  In 05/2011 his HGB was in the 13 range.  His admission HGB was in the 7 range and he denies any issues with hematochezia, melena, diarrhea, abdominal pain, NSAID use, or family history of colon cancer.  No prior history of a colonoscopy and there is no history of PUD.  His iron saturation was at 12% and he was noted to be heme positive.  As a result of his findings a GI consultation was requested.  Past Medical History  Diagnosis Date  . Hypertension   . Hypercholesteremia   . CHF (congestive heart failure)     has diastolic heart failure grade 1; EF is 45 to 50% per echo 05/2011  . Coronary artery disease   . COPD (chronic obstructive pulmonary disease)   . Asthma   . Obesity   . Noncompliance   . NSVT (nonsustained ventricular tachycardia)     beta blocker restarted  . Atrial tachycardia     managed on beta blocker therapy    Past Surgical History  Procedure Laterality Date  . Coronary stent placement      x2  . Cardiac catheterization  11/12/2008    stent x 2 to RCA    Family History  Problem Relation Age of Onset  . Hypertension Father   . Heart disease Father   . Diabetes Father   . Hypertension Mother     Social  History:  reports that he quit smoking about 4 years ago. His smoking use included Cigarettes. He smoked 0.00 packs per day. He has never used smokeless tobacco. He reports that he does not drink alcohol or use illicit drugs.  Allergies: No Known Allergies  Medications:  Scheduled: . carvedilol  25 mg Oral Daily  . cyanocobalamin  1,000 mcg Subcutaneous Daily  . diltiazem  240 mg Oral Daily  . docusate sodium  100 mg Oral BID  . [START ON 02/08/2013] folic acid  1 mg Oral Daily  . sodium chloride  3 mL Intravenous Q12H   Continuous:   Results for orders placed during the hospital encounter of 02/02/13 (from the past 24 hour(s))  BASIC METABOLIC PANEL     Status: Abnormal   Collection Time    02/07/13  5:32 AM      Result Value Range   Sodium 141  135 - 145 mEq/L   Potassium 4.2  3.5 - 5.1 mEq/L   Chloride 112  96 - 112 mEq/L   CO2 20  19 - 32 mEq/L   Glucose, Bld 105 (*) 70 - 99 mg/dL   BUN 20  6 - 23 mg/dL   Creatinine, Ser 1.99 (*) 0.50 - 1.35 mg/dL     Calcium 8.9  8.4 - 10.5 mg/dL   GFR calc non Af Amer 35 (*) >90 mL/min   GFR calc Af Amer 41 (*) >90 mL/min  CBC     Status: Abnormal   Collection Time    02/07/13  5:32 AM      Result Value Range   WBC 8.1  4.0 - 10.5 K/uL   RBC 2.86 (*) 4.22 - 5.81 MIL/uL   Hemoglobin 8.0 (*) 13.0 - 17.0 g/dL   HCT 25.8 (*) 39.0 - 52.0 %   MCV 90.2  78.0 - 100.0 fL   MCH 28.0  26.0 - 34.0 pg   MCHC 31.0  30.0 - 36.0 g/dL   RDW 18.8 (*) 11.5 - 15.5 %   Platelets 167  150 - 400 K/uL     No results found.  ROS:  As stated above in the HPI otherwise negative.  Blood pressure 114/71, pulse 77, temperature 98.2 F (36.8 C), temperature source Oral, resp. rate 18, height 5' 10.5" (1.791 m), weight 293 lb 3.4 oz (133 kg), SpO2 95.00%.    PE: Gen: NAD, Alert and Oriented HEENT:  /AT, EOMI Neck: Supple, no LAD Lungs: CTA Bilaterally CV: RRR without M/G/R ABM: Soft, NTND, +BS Ext: No C/C/E  Assessment/Plan: 1) IDA. 2) Heme  positive stool.   The patient requires further evaluation.  His creatinine has improved and it appears to be a pre-renal etiology, however, there is no clear identification of volume loss.    Plan: 1) EGD/Colonoscopy tomorrow.  Larry [name] D 02/07/2013, 12:44 PM      

## 2013-02-09 DIAGNOSIS — C189 Malignant neoplasm of colon, unspecified: Secondary | ICD-10-CM | POA: Diagnosis present

## 2013-02-09 DIAGNOSIS — I2589 Other forms of chronic ischemic heart disease: Secondary | ICD-10-CM

## 2013-02-09 LAB — BASIC METABOLIC PANEL
CO2: 21 mEq/L (ref 19–32)
Calcium: 9.2 mg/dL (ref 8.4–10.5)
Chloride: 107 mEq/L (ref 96–112)
Creatinine, Ser: 1.67 mg/dL — ABNORMAL HIGH (ref 0.50–1.35)
Glucose, Bld: 88 mg/dL (ref 70–99)
Sodium: 137 mEq/L (ref 135–145)

## 2013-02-09 LAB — CBC
HCT: 26 % — ABNORMAL LOW (ref 39.0–52.0)
MCH: 28.1 pg (ref 26.0–34.0)
MCHC: 30.8 g/dL (ref 30.0–36.0)
MCV: 91.2 fL (ref 78.0–100.0)
Platelets: 189 10*3/uL (ref 150–400)
RBC: 2.85 MIL/uL — ABNORMAL LOW (ref 4.22–5.81)
WBC: 7 10*3/uL (ref 4.0–10.5)

## 2013-02-09 LAB — HEPATIC FUNCTION PANEL
ALT: 14 U/L (ref 0–53)
AST: 9 U/L (ref 0–37)
Bilirubin, Direct: 0.1 mg/dL (ref 0.0–0.3)
Indirect Bilirubin: 0.7 mg/dL (ref 0.3–0.9)
Total Bilirubin: 0.8 mg/dL (ref 0.3–1.2)

## 2013-02-09 LAB — PREALBUMIN: Prealbumin: 17.8 mg/dL — ABNORMAL LOW (ref 17.0–34.0)

## 2013-02-09 MED ORDER — ENOXAPARIN SODIUM 40 MG/0.4ML ~~LOC~~ SOLN
40.0000 mg | SUBCUTANEOUS | Status: DC
  Administered 2013-02-09 – 2013-02-11 (×3): 40 mg via SUBCUTANEOUS
  Filled 2013-02-09 (×4): qty 0.4

## 2013-02-09 MED ORDER — ENSURE COMPLETE PO LIQD
237.0000 mL | Freq: Three times a day (TID) | ORAL | Status: DC
  Administered 2013-02-09 – 2013-02-11 (×8): 237 mL via ORAL

## 2013-02-09 MED ORDER — DILTIAZEM HCL ER COATED BEADS 120 MG PO CP24
120.0000 mg | ORAL_CAPSULE | Freq: Every day | ORAL | Status: DC
  Administered 2013-02-10 – 2013-02-11 (×2): 120 mg via ORAL
  Filled 2013-02-09 (×3): qty 1

## 2013-02-09 MED ORDER — CARVEDILOL 12.5 MG PO TABS
12.5000 mg | ORAL_TABLET | Freq: Two times a day (BID) | ORAL | Status: DC
  Administered 2013-02-09 – 2013-02-11 (×5): 12.5 mg via ORAL
  Filled 2013-02-09 (×8): qty 1

## 2013-02-09 NOTE — Progress Notes (Signed)
Doing well. Tolerated liquids. +BM  Alert, nad Obese, soft, nt  Await path results. CEA 5 Needs cardiac evaluation Will start ensure for nutritional supplementation for [name]  Larry [name]. Andrey Campanile, MD, FACS General, Bariatric, & Minimally Invasive Surgery [facility], [place]

## 2013-02-09 NOTE — Progress Notes (Signed)
[facility]                                                                                Patient Demographics  Larry [name], is a 62 y.o. male, DOB - 20-Oct-1950, ZOX:[id number]  Admit date - 02/02/2013   Admitting Physician Hillary Bow, DO  Outpatient Primary MD for the patient is No primary provider on file.  LOS - 7   Chief Complaint  Patient presents with  . Weakness  . Near Syncope        Assessment & Plan   Principal Problem:   Acute renal failure Active Problems:   CARDIOMYOPATHY, ISCHEMIC   Hypotension   Chronic blood loss anemia   Atrial fibrillation  Severe acute renal failure  -Improving, creatinine 1.67 -Renal ultrasound showed no hydronephrosis -May be secondary to medications versus dehydration  Atrial Tachycardia  -rates have been up to 130's at rest, currently controlled  -Continue Coreg and Cardizem  Hypotension  -Likely secondary to hypovolemia  -Resolved   Normocytic anemia  -H/H 8.0/26, stable, however patient has had dark stools and has had POCT positive -Multifactorial: GI related vs renal disease -Patient was supposed to see Dr. Marina Goodell as an outpatient however, this was cancelled due to illness.   -Iron studies show Iron def anemia -Patient has received 4units PRBCs this admission -EGD showed antral erosions and duodenitis. -Colonoscopy showed obstructing colon mass, transverse colon. Diverticula, internal and external hemorrhoids  Colonic mass -Found on colonoscopy -CEA 5.2 -CT abd scan showed Circumferential mass involving the mid transverse colon is identified compatible with colon carcinoma. Severallow-attenuation foci are identified within the liver parenchyma.  -CT chest: Subpleural nodule in the right upper lobe measures 5 mm. -Surgery consulted, will obtain cardiology assessment for sugery  B12 and folate deficiency  -B12 level Oct 2014 was quit low at 251 - repeated this admit 393 but given anemia, received  loading dose -Folate low so was given IV and now continues po   Hyperlipidemia -treatment being held  Hx of diastolic CHF as well as ischemic CM/systolic CHF  -EF 45-50% via cath 2013  -care with volume resuscitation as patient will be a risk for rapidly converting to volume overload   COPD  -Well compensated at present and not requiring any medication -Has Xopenex when necessary  Hx of CAD s/p PTCA w/ stents x 2 2010  -Asymptomatic (see above)   Obesity - Body mass index is 41.46 kg/(m^2).  Nausea -Will add Zofran when necessary  Code Status: Full  Family Communication: None at this time. Attempted to contact wife.  Disposition Plan: Admitted.   Procedures  EGD Colonoscopy  Consults   Gastroenterology Cardiology  DVT Prophylaxis  SCDs  Lab Results  Component Value Date   PLT 189 02/09/2013    Medications  Scheduled Meds: . carvedilol  25 mg Oral Daily  . diltiazem  240 mg Oral Daily  . docusate sodium  100 mg Oral BID  . enoxaparin (LOVENOX) injection  40 mg Subcutaneous Q24H  . feeding supplement (ENSURE COMPLETE)  237 mL Oral TID BM  . folic acid  1 mg Oral Daily  . sodium chloride  3 mL Intravenous  Q12H   Continuous Infusions:   PRN Meds:.levalbuterol, ondansetron (ZOFRAN) IV  Antibiotics    Anti-infectives   None     Time Spent in minutes   25 minutes   Laxmi Choung D.O. on 02/09/2013 at 12:29 PM  Between 7am to 7pm - Pager - [phone]  After 7pm go to [email or web address] - password [email or web address]  And look for the night coverage person covering for me after hours  [facility] Office  [phone]    Subjective:   Larry [name] seen and examined today.  Patient has no complaints.  Is anxious about the results of his colonoscopy and CT.    Objective:   Filed Vitals:   02/08/13 1400 02/08/13 2329 02/09/13 0425 02/09/13 1000  BP: 121/68 108/67 99/63 98/63   Pulse: 78 96 82 82  Temp: 98.2 F (36.8 C) 99.6 F (37.6 C)  99.1 F (37.3 C) 98.8 F (37.1 C)  TempSrc: Oral Oral Oral Oral  Resp: 18 18 19 18   Height:      Weight:  134.3 kg (296 lb 1.2 oz)    SpO2: 97% 96% 98% 98%    Wt Readings from Last 3 Encounters:  02/08/13 134.3 kg (296 lb 1.2 oz)  02/08/13 134.3 kg (296 lb 1.2 oz)  12/06/12 147.873 kg (326 lb)     Intake/Output Summary (Last 24 hours) at 02/09/13 1229 Last data filed at 02/09/13 0900  Gross per 24 hour  Intake    840 ml  Output    852 ml  Net    -12 ml    Exam  General: Well developed, well nourished, NAD, appears stated age  HEENT: NCAT, PERRLA, EOMI, Anicteic Sclera, mucous membranes moist. No pharyngeal erythema or exudates  Neck: Supple, no JVD, no masses  Cardiovascular: S1 S2 auscultated, no rubs, murmurs or gallops. Regular rate and rhythm.  Respiratory: Clear to auscultation bilaterally with equal chest rise  Abdomen: Soft, obese, nontender, nondistended, + bowel sounds  Extremities: warm dry without cyanosis clubbing or edema  Neuro: AAOx3, cranial nerves grossly intact. Strength 5/5 in patient's upper and lower extremities bilaterally  Skin: Without rashes exudates or nodules  Psych: Normal affect and demeanor with intact judgement and insight  Data Review   Micro Results Recent Results (from the past 240 hour(s))  MRSA PCR SCREENING     Status: None   Collection Time    02/02/13 10:35 PM      Result Value Range Status   MRSA by PCR NEGATIVE  NEGATIVE Final   Comment:            The GeneXpert MRSA Assay (FDA     approved for NASAL specimens     only), is one component of a     comprehensive MRSA colonization     surveillance program. It is not     intended to diagnose MRSA     infection nor to guide or     monitor treatment for     MRSA infections.    Radiology Reports US Renal  02/02/2013   CLINICAL DATA:  Renal failure  EXAM: RENAL/URINARY TRACT ULTRASOUND COMPLETE  COMPARISON:  None.  FINDINGS: Right Kidney:  Length: 11.6 cm.  Echogenic renal parenchyma, suggesting medical renal disease. 1.4 x 1.1 x 1.2 cm upper pole cyst. No hydronephrosis.  Left Kidney:  Length: 11.7 cm. Echogenic renal parenchyma, suggesting medical renal disease. 2.1 x 2.0 x 2.0 cm upper pole cyst. No hydronephrosis.  Bladder:  Decompressed by indwelling Foley  catheter.  IMPRESSION: Echogenic renal parenchyma, suggesting medical renal disease.  Bilateral renal cysts.  No hydronephrosis.   Electronically Signed   By: Charline Bills M.D.   On: 02/02/2013 20:59   Dg Chest Portable 1 View  02/02/2013   CLINICAL DATA:  Weakness, presyncope  EXAM: PORTABLE CHEST - 1 VIEW  COMPARISON:  06/16/2011  FINDINGS: Mildly low lung volumes. There is mild elevation the right hemidiaphragm. Cardiac silhouette is within normal limits. There is no evidence of focal infiltrates, effusions, nor edema. Dextroscoliosis identified within the thoracic spine.  IMPRESSION: No evidence of acute cardiopulmonary disease.   Electronically Signed   By: Salome Holmes M.D.   On: 02/02/2013 17:34    CBC  Recent Labs Lab 02/02/13 1622  02/04/13 0345 02/05/13 0525 02/07/13 0532 02/08/13 0540 02/09/13 0540  WBC 8.5  < > 7.9 7.4 8.1 7.8 7.0  HGB 7.3*  < > 7.3* 8.1* 8.0* 8.7* 8.0*  HCT 23.7*  < > 22.8* 25.8* 25.8* 28.3* 26.0*  PLT 242  < > 160 158 167 167 189  MCV 87.1  < > 86.7 88.4 90.2 91.6 91.2  MCH 26.8  < > 27.8 27.7 28.0 28.2 28.1  MCHC 30.8  < > 32.0 31.4 31.0 30.7 30.8  RDW 20.5*  < > 19.2* 18.1* 18.8* 18.9* 18.5*  LYMPHSABS 1.3  --   --   --   --   --   --   MONOABS 0.8  --   --   --   --   --   --   EOSABS 0.2  --   --   --   --   --   --   BASOSABS 0.0  --   --   --   --   --   --   < > = values in this interval not displayed.  Chemistries   Recent Labs Lab 02/02/13 1622  02/05/13 0525 02/06/13 0430 02/07/13 0532 02/08/13 0540 02/09/13 0540  NA 138  < > 140 140 141 141 137  K 5.6*  < > 4.4 4.9 4.2 4.7 4.0  CL 104  < > 113* 112 112 112 107  CO2 21  <  > 18* 18* 20 19 21   GLUCOSE 103*  < > 84 90 105* 82 88  BUN 70*  < > 33* 26* 20 16 12   CREATININE 7.83*  < > 2.66* 2.28* 1.99* 1.75* 1.67*  CALCIUM 9.4  < > 8.4 8.9 8.9 9.2 9.2  AST 8  --   --   --   --   --  9  ALT 9  --   --   --   --   --  14  ALKPHOS 111  --   --   --   --   --  79  BILITOT 0.4  --   --   --   --   --  0.8  < > = values in this interval not displayed. ------------------------------------------------------------------------------------------------------------------ estimated creatinine clearance is 66.2 ml/min (by C-G formula based on Cr of 1.67). ------------------------------------------------------------------------------------------------------------------ No results found for this basename: HGBA1C,  in the last 72 hours ------------------------------------------------------------------------------------------------------------------ No results found for this basename: CHOL, HDL, LDLCALC, TRIG, CHOLHDL, LDLDIRECT,  in the last 72 hours ------------------------------------------------------------------------------------------------------------------ No results found for this basename: TSH, T4TOTAL, FREET3, T3FREE, THYROIDAB,  in the last 72 hours ------------------------------------------------------------------------------------------------------------------ No results found for this basename: VITAMINB12, FOLATE, FERRITIN, TIBC, IRON, RETICCTPCT,  in the last  72 hours  Coagulation profile No results found for this basename: INR, PROTIME,  in the last 168 hours  No results found for this basename: DDIMER,  in the last 72 hours  Cardiac Enzymes  Recent Labs Lab 02/02/13 1622  TROPONINI <0.30   ------------------------------------------------------------------------------------------------------------------ No components found with this basename: POCBNP,

## 2013-02-09 NOTE — Progress Notes (Signed)
1 Day Post-Op  Subjective: No real change, Awaiting bx and cardiac evaluation  Objective: Vital signs in last 24 hours: Temp:  [98.2 F (36.8 C)-99.6 F (37.6 C)] 99.1 F (37.3 C) (12/20 0425) Pulse Rate:  [78-96] 82 (12/20 0425) Resp:  [18-19] 19 (12/20 0425) BP: (99-121)/(63-68) 99/63 mmHg (12/20 0425) SpO2:  [96 %-98 %] 98 % (12/20 0425) Weight:  [134.3 kg (296 lb 1.2 oz)] 134.3 kg (296 lb 1.2 oz) (12/19 2329) Last BM Date: 02/08/13 600 PO recorded; Diet: clear liquids 1 BM recorded TM 99.1 Creatinine improving, albumin 2.9,. Prealbumin pending H/H 8/26 Biopsy path pending Intake/Output from previous day: 12/19 0701 - 12/20 0700 In: 1100 [P.O.:600; I.V.:500] Out: 652 [Urine:651; Stool:1] Intake/Output this shift:    General appearance: alert, cooperative and no distress GI: large abdomen, nontender +BS.   Lab Results:   Recent Labs  02/08/13 0540 02/09/13 0540  WBC 7.8 7.0  HGB 8.7* 8.0*  HCT 28.3* 26.0*  PLT 167 189    BMET  Recent Labs  02/08/13 0540 02/09/13 0540  NA 141 137  K 4.7 4.0  CL 112 107  CO2 19 21  GLUCOSE 82 88  BUN 16 12  CREATININE 1.75* 1.67*  CALCIUM 9.2 9.2   PT/INR No results found for this basename: LABPROT, INR,  in the last 72 hours   Recent Labs Lab 02/02/13 1622 02/04/13 0345 02/09/13 0540  AST 8  --  9  ALT 9  --  14  ALKPHOS 111  --  79  BILITOT 0.4  --  0.8  PROT 7.5  --  6.6  ALBUMIN 3.5 2.8* 2.9*     Lipase     Component Value Date/Time   LIPASE 15 11/05/2008 1215     Studies/Results: Ct Chest W Contrast  02/08/2013   CLINICAL DATA:  Mass in transverse colon  EXAM: CT CHEST, ABDOMEN, AND PELVIS WITH CONTRAST  TECHNIQUE: Multidetector CT imaging of the chest, abdomen and pelvis was performed following the standard protocol during bolus administration of intravenous contrast.  CONTRAST:  OMNIPAQUE IOHEXOL 300 MG/ML  SOLN  COMPARISON:  100 cc of Omni 300  FINDINGS: CT CHEST FINDINGS  There is no  pleural effusion identified. Subpleural nodule within the anterior right upper lobe measures 5 mm, image 33/series 3. Subpleural scar is identified in the right upper lobe, image 28/series 3.  The trachea appears patent and midline. The heart size is normal. Calcified atherosclerotic disease affects the thoracic aorta as well as the RCA, LAD and left circumflex coronary artery. No pericardial effusion noted. There is no mediastinal or hilar adenopathy. No axillary or supraclavicular adenopathy identified.  Review of the visualized bony structures is significant for mild multilevel thoracic spondylosis.  CT ABDOMEN AND PELVIS FINDINGS  There are several small, indeterminate low attenuation structures within the liver parenchyma. These are too small to characterize. Index hypodensity within the right hepatic lobe measures 6 mm, image number 58/series 2. Near the dome of liver there is a 5 mm hypodensity, image 51/series 2. Posterior right hepatic lobe hypodensity measures 5 mm, image 62/series 2. The gallbladder appears normal. There is no biliary dilatation. Normal appearance of the pancreas. The spleen is unremarkable.  Normal appearance of the adrenal glands. Left renal cyst identified. Bilateral renal hypodensities are noted likely representing cysts. 3.8 cm diverticula arises from the right posterior bladder wall. Calcifications within the prostate gland are identified.  Calcified atherosclerotic disease affects the abdominal aorta. There is no aneurysm.  No retroperitoneal adenopathy identified. No pelvic or inguinal adenopathy noted.  Normal appearance of the stomach. The small bowel loops are within normal limits circumferential mass involving the mid transverse colon is identified, image number 82/series 2. This measures approximately 6 cm in length. There is increased caliber of the proximal large bowel suggesting partial obstruction. No ascites or peritoneal nodularity identified.  Review of the visualized  osseous structures is significant for mild lumbar spondylosis. No aggressive lytic or sclerotic bone lesions identified.  IMPRESSION: CT chest:  1. Circumferential mass involving the mid transverse colon is identified compatible with colon carcinoma. 2. Subpleural nodule in the right upper lobe measures 5 mm. 3. Several low-attenuation foci are identified within the liver parenchyma. These are too small to reliably characterize. In a patient with a new diagnosis of colon cancer I would suggest more definitive characterization with contrast enhanced MRI of the liver.   Electronically Signed   By: Signa Kell M.D.   On: 02/08/2013 14:46   Ct Abdomen Pelvis W Contrast  02/08/2013   CLINICAL DATA:  Mass in transverse colon  EXAM: CT CHEST, ABDOMEN, AND PELVIS WITH CONTRAST  TECHNIQUE: Multidetector CT imaging of the chest, abdomen and pelvis was performed following the standard protocol during bolus administration of intravenous contrast.  CONTRAST:  OMNIPAQUE IOHEXOL 300 MG/ML  SOLN  COMPARISON:  100 cc of Omni 300  FINDINGS: CT CHEST FINDINGS  There is no pleural effusion identified. Subpleural nodule within the anterior right upper lobe measures 5 mm, image 33/series 3. Subpleural scar is identified in the right upper lobe, image 28/series 3.  The trachea appears patent and midline. The heart size is normal. Calcified atherosclerotic disease affects the thoracic aorta as well as the RCA, LAD and left circumflex coronary artery. No pericardial effusion noted. There is no mediastinal or hilar adenopathy. No axillary or supraclavicular adenopathy identified.  Review of the visualized bony structures is significant for mild multilevel thoracic spondylosis.  CT ABDOMEN AND PELVIS FINDINGS  There are several small, indeterminate low attenuation structures within the liver parenchyma. These are too small to characterize. Index hypodensity within the right hepatic lobe measures 6 mm, image number 58/series 2.  Near the dome of liver there is a 5 mm hypodensity, image 51/series 2. Posterior right hepatic lobe hypodensity measures 5 mm, image 62/series 2. The gallbladder appears normal. There is no biliary dilatation. Normal appearance of the pancreas. The spleen is unremarkable.  Normal appearance of the adrenal glands. Left renal cyst identified. Bilateral renal hypodensities are noted likely representing cysts. 3.8 cm diverticula arises from the right posterior bladder wall. Calcifications within the prostate gland are identified.  Calcified atherosclerotic disease affects the abdominal aorta. There is no aneurysm. No retroperitoneal adenopathy identified. No pelvic or inguinal adenopathy noted.  Normal appearance of the stomach. The small bowel loops are within normal limits circumferential mass involving the mid transverse colon is identified, image number 82/series 2. This measures approximately 6 cm in length. There is increased caliber of the proximal large bowel suggesting partial obstruction. No ascites or peritoneal nodularity identified.  Review of the visualized osseous structures is significant for mild lumbar spondylosis. No aggressive lytic or sclerotic bone lesions identified.  IMPRESSION: CT chest:  1. Circumferential mass involving the mid transverse colon is identified compatible with colon carcinoma. 2. Subpleural nodule in the right upper lobe measures 5 mm. 3. Several low-attenuation foci are identified within the liver parenchyma. These are too small to reliably characterize. In a  patient with a new diagnosis of colon cancer I would suggest more definitive characterization with contrast enhanced MRI of the liver.   Electronically Signed   By: Signa Kell M.D.   On: 02/08/2013 14:46    Medications: . carvedilol  25 mg Oral Daily  . cyanocobalamin  1,000 mcg Subcutaneous Daily  . diltiazem  240 mg Oral Daily  . docusate sodium  100 mg Oral BID  . folic acid  1 mg Oral Daily  . sodium  chloride  3 mL Intravenous Q12H    Assessment/Plan 1. Obstructing colon mass. (No BM for 2 weeks)  2. Acute renal failure  3. Anemia with GI blood loss.  4. CAD with CM; EF 345-50% 05/2011.  5. Hx of 2 RCA stents 2010./hx of atrial tachycardia/NSVT on BB and CCB's.  6. COPD/tobacco use Hx.  7. Hx of ETOH heavy use 20+ years, quit 2010.  8. Obesity Body mass index is 41.6.  9. Dyslipidemia  10. Deconditioning/malnutrition, he has been inactive for 5 weeks and not eating.   Plan:   Await path and cardiac clearance.  Discuss TNA, he has not really eaten for 5 weeks. Add Lovenox/SCD DVT prophylaxis.  LOS: 7 days    Jasani Dolney 02/09/2013

## 2013-02-09 NOTE — Consult Note (Signed)
CARDIOLOGY CONSULT NOTE   Patient ID: Larry [name] MRN: [id number], DOB/AGE: 09/28/1950   Admit date: 02/02/2013 Date of Consult: 02/09/2013   Primary Physician: No primary provider on file. Primary Cardiologist: Dr. Johney Frame  Pt. Profile  62 year old gentleman admitted with acute renal failure and creatinine of 7.8 with hypotension and weakness.  Admitted on 02/02/13  Problem List  Past Medical History  Diagnosis Date  . Hypertension   . Hypercholesteremia   . CHF (congestive heart failure)     has diastolic heart failure grade 1; EF is 45 to 50% per echo 05/2011  . Coronary artery disease   . COPD (chronic obstructive pulmonary disease)   . Asthma   . Obesity   . Noncompliance   . NSVT (nonsustained ventricular tachycardia)     beta blocker restarted  . Atrial tachycardia     managed on beta blocker therapy    Past Surgical History  Procedure Laterality Date  . Coronary stent placement      x2  . Cardiac catheterization  11/12/2008    stent x 2 to RCA     Allergies  No Known Allergies  HPI   This 62 year old African American gentleman has a history of previous ischemic cardiomyopathy.  He had cardiac catheterization with 2 stents placed to his right coronary artery in 2010 at [facility].  The patient has a history of ischemic cardiomyopathy with his most recent two-dimensional echocardiogram in April 2013 showing an ejection fraction of 45-50%.  Patient has a past history of atrial tachycardia and nonsustained ventricular tachycardia which has been managed on beta blocker therapy.  The patient denies any recent chest pain or angina.  He states that laying up to this admission he was compliant with his home medications of aspirin 325 daily, carvedilol 25 mg daily, clopidogrel 75 mg daily, Cardizem CD 240 mg daily, furosemide 40 mg daily, lisinopril 20 mg daily, Crestor 40 mg daily, and potassium 20 mEq daily.  Because of his renal failure on admission  many of these medicines have been held.  The patient was recently found to have heme positive stools and anemia.  He underwent colonoscopy and esophagogastroduodenoscopy on 02/08/13 which demonstrated a obstructing colon mass in the transverse colon.  Cardiology is asked to see him for preoperative clearance.  Inpatient Medications  . carvedilol  25 mg Oral Daily  . diltiazem  240 mg Oral Daily  . docusate sodium  100 mg Oral BID  . enoxaparin (LOVENOX) injection  40 mg Subcutaneous Q24H  . feeding supplement (ENSURE COMPLETE)  237 mL Oral TID BM  . folic acid  1 mg Oral Daily  . sodium chloride  3 mL Intravenous Q12H    Family History Family History  Problem Relation Age of Onset  . Hypertension Father   . Heart disease Father   . Diabetes Father   . Hypertension Mother      Social History History   Social History  . Marital Status: Single    Spouse Name: N/A    Number of Children: N/A  . Years of Education: N/A   Occupational History  . Not on file.   Social History Main Topics  . Smoking status: Former Smoker    Types: Cigarettes    Quit date: 07/15/2008  . Smokeless tobacco: Never Used  . Alcohol Use: No     Comment: not since New Year's  . Drug Use: No  . Sexual Activity: No   Other Topics Concern  .  Not on file   Social History Narrative  . No narrative on file     Review of Systems  General:  No chills, fever, night sweats or weight changes.  Cardiovascular:  No chest pain, dyspnea on exertion, edema, orthopnea, palpitations, paroxysmal nocturnal dyspnea. Dermatological: No rash, lesions/masses Respiratory: No cough, dyspnea Urologic: No hematuria, dysuria Abdominal:   No nausea, vomiting, positive for occult blood in stool Neurologic:  No visual changes, wkns, changes in mental status. All other systems reviewed and are otherwise negative except as noted above.  Physical Exam  Blood pressure 102/60, pulse 74, temperature 98.4 F (36.9 C),  temperature source Oral, resp. rate 18, height 5' 10.5" (1.791 m), weight 296 lb 1.2 oz (134.3 kg), SpO2 96.00%.  General: Pleasant, NAD.  Obese Psych: Normal affect. Neuro: Alert and oriented X 3. Moves all extremities spontaneously. HEENT: Normal  Neck: Supple without bruits or JVD. Lungs:  Resp regular and unlabored, CTA. Heart: RRR no s3, s4, or murmurs. Abdomen: Soft, non-tender, non-distended, BS + x 4.  Extremities: No clubbing, cyanosis or edema.  Weak pedal pulses.  No results found for this basename: CKTOTAL, CKMB, TROPONINI,  in the last 72 hours Lab Results  Component Value Date   WBC 7.0 02/09/2013   HGB 8.0* 02/09/2013   HCT 26.0* 02/09/2013   MCV 91.2 02/09/2013   PLT 189 02/09/2013     Recent Labs Lab 02/09/13 0540  NA 137  K 4.0  CL 107  CO2 21  BUN 12  CREATININE 1.67*  CALCIUM 9.2  PROT 6.6  BILITOT 0.8  ALKPHOS 79  ALT 14  AST 9  GLUCOSE 88   Lab Results  Component Value Date   CHOL 130 12/06/2012   HDL 41.30 12/06/2012   LDLCALC 73 12/06/2012   TRIG 77.0 12/06/2012   No results found for this basename: DDIMER    Radiology/Studies  Ct Chest W Contrast  02/08/2013   CLINICAL DATA:  Mass in transverse colon  EXAM: CT CHEST, ABDOMEN, AND PELVIS WITH CONTRAST  TECHNIQUE: Multidetector CT imaging of the chest, abdomen and pelvis was performed following the standard protocol during bolus administration of intravenous contrast.  CONTRAST:  OMNIPAQUE IOHEXOL 300 MG/ML  SOLN  COMPARISON:  100 cc of Omni 300  FINDINGS: CT CHEST FINDINGS  There is no pleural effusion identified. Subpleural nodule within the anterior right upper lobe measures 5 mm, image 33/series 3. Subpleural scar is identified in the right upper lobe, image 28/series 3.  The trachea appears patent and midline. The heart size is normal. Calcified atherosclerotic disease affects the thoracic aorta as well as the RCA, LAD and left circumflex coronary artery. No pericardial effusion  noted. There is no mediastinal or hilar adenopathy. No axillary or supraclavicular adenopathy identified.  Review of the visualized bony structures is significant for mild multilevel thoracic spondylosis.  CT ABDOMEN AND PELVIS FINDINGS  There are several small, indeterminate low attenuation structures within the liver parenchyma. These are too small to characterize. Index hypodensity within the right hepatic lobe measures 6 mm, image number 58/series 2. Near the dome of liver there is a 5 mm hypodensity, image 51/series 2. Posterior right hepatic lobe hypodensity measures 5 mm, image 62/series 2. The gallbladder appears normal. There is no biliary dilatation. Normal appearance of the pancreas. The spleen is unremarkable.  Normal appearance of the adrenal glands. Left renal cyst identified. Bilateral renal hypodensities are noted likely representing cysts. 3.8 cm diverticula arises from the right posterior  bladder wall. Calcifications within the prostate gland are identified.  Calcified atherosclerotic disease affects the abdominal aorta. There is no aneurysm. No retroperitoneal adenopathy identified. No pelvic or inguinal adenopathy noted.  Normal appearance of the stomach. The small bowel loops are within normal limits circumferential mass involving the mid transverse colon is identified, image number 82/series 2. This measures approximately 6 cm in length. There is increased caliber of the proximal large bowel suggesting partial obstruction. No ascites or peritoneal nodularity identified.  Review of the visualized osseous structures is significant for mild lumbar spondylosis. No aggressive lytic or sclerotic bone lesions identified.  IMPRESSION: CT chest:  1. Circumferential mass involving the mid transverse colon is identified compatible with colon carcinoma. 2. Subpleural nodule in the right upper lobe measures 5 mm. 3. Several low-attenuation foci are identified within the liver parenchyma. These are too small  to reliably characterize. In a patient with a new diagnosis of colon cancer I would suggest more definitive characterization with contrast enhanced MRI of the liver.   Electronically Signed   By: Signa Kell M.D.   On: 02/08/2013 14:46   Ct Abdomen Pelvis W Contrast  02/08/2013   CLINICAL DATA:  Mass in transverse colon  EXAM: CT CHEST, ABDOMEN, AND PELVIS WITH CONTRAST  TECHNIQUE: Multidetector CT imaging of the chest, abdomen and pelvis was performed following the standard protocol during bolus administration of intravenous contrast.  CONTRAST:  OMNIPAQUE IOHEXOL 300 MG/ML  SOLN  COMPARISON:  100 cc of Omni 300  FINDINGS: CT CHEST FINDINGS  There is no pleural effusion identified. Subpleural nodule within the anterior right upper lobe measures 5 mm, image 33/series 3. Subpleural scar is identified in the right upper lobe, image 28/series 3.  The trachea appears patent and midline. The heart size is normal. Calcified atherosclerotic disease affects the thoracic aorta as well as the RCA, LAD and left circumflex coronary artery. No pericardial effusion noted. There is no mediastinal or hilar adenopathy. No axillary or supraclavicular adenopathy identified.  Review of the visualized bony structures is significant for mild multilevel thoracic spondylosis.  CT ABDOMEN AND PELVIS FINDINGS  There are several small, indeterminate low attenuation structures within the liver parenchyma. These are too small to characterize. Index hypodensity within the right hepatic lobe measures 6 mm, image number 58/series 2. Near the dome of liver there is a 5 mm hypodensity, image 51/series 2. Posterior right hepatic lobe hypodensity measures 5 mm, image 62/series 2. The gallbladder appears normal. There is no biliary dilatation. Normal appearance of the pancreas. The spleen is unremarkable.  Normal appearance of the adrenal glands. Left renal cyst identified. Bilateral renal hypodensities are noted likely representing cysts.  3.8 cm diverticula arises from the right posterior bladder wall. Calcifications within the prostate gland are identified.  Calcified atherosclerotic disease affects the abdominal aorta. There is no aneurysm. No retroperitoneal adenopathy identified. No pelvic or inguinal adenopathy noted.  Normal appearance of the stomach. The small bowel loops are within normal limits circumferential mass involving the mid transverse colon is identified, image number 82/series 2. This measures approximately 6 cm in length. There is increased caliber of the proximal large bowel suggesting partial obstruction. No ascites or peritoneal nodularity identified.  Review of the visualized osseous structures is significant for mild lumbar spondylosis. No aggressive lytic or sclerotic bone lesions identified.  IMPRESSION: CT chest:  1. Circumferential mass involving the mid transverse colon is identified compatible with colon carcinoma. 2. Subpleural nodule in the right upper lobe measures  5 mm. 3. Several low-attenuation foci are identified within the liver parenchyma. These are too small to reliably characterize. In a patient with a new diagnosis of colon cancer I would suggest more definitive characterization with contrast enhanced MRI of the liver.   Electronically Signed   By: Signa Kell M.D.   On: 02/08/2013 14:46   US Renal  02/02/2013   CLINICAL DATA:  Renal failure  EXAM: RENAL/URINARY TRACT ULTRASOUND COMPLETE  COMPARISON:  None.  FINDINGS: Right Kidney:  Length: 11.6 cm. Echogenic renal parenchyma, suggesting medical renal disease. 1.4 x 1.1 x 1.2 cm upper pole cyst. No hydronephrosis.  Left Kidney:  Length: 11.7 cm. Echogenic renal parenchyma, suggesting medical renal disease. 2.1 x 2.0 x 2.0 cm upper pole cyst. No hydronephrosis.  Bladder:  Decompressed by indwelling Foley catheter.  IMPRESSION: Echogenic renal parenchyma, suggesting medical renal disease.  Bilateral renal cysts.  No hydronephrosis.   Electronically  Signed   By: Charline Bills M.D.   On: 02/02/2013 20:59   Dg Chest Portable 1 View  02/02/2013   CLINICAL DATA:  Weakness, presyncope  EXAM: PORTABLE CHEST - 1 VIEW  COMPARISON:  06/16/2011  FINDINGS: Mildly low lung volumes. There is mild elevation the right hemidiaphragm. Cardiac silhouette is within normal limits. There is no evidence of focal infiltrates, effusions, nor edema. Dextroscoliosis identified within the thoracic spine.  IMPRESSION: No evidence of acute cardiopulmonary disease.   Electronically Signed   By: Salome Holmes M.D.   On: 02/02/2013 17:34    ECG  Sinus tachycardia Low voltage, extremity leads No significant change since Confirmed by POLLINA MD, CHRISTOPHER  ASSESSMENT AND PLAN 1.  Ischemic cardiomyopathy.  Last echo was in April 2013.  At that time his ejection fraction was 45-50%.  We will plan to update his echo. 2.   Essential hypertension.  Blood pressure is low normal range now. 3. past history of supraventricular tachycardia.  Telemetry here is stable 4. ischemic heart disease status post 2 stents to right coronary artery in 2010.  He denies any recent chest pain or angina. 5. Obesity  Plan: Update echocardiogram.  In view of low blood pressure I will continue carvedilol but reduce diltiazem at this point.  I will change carvedilol to 12.5 mg twice a day.  Will follow with you.  Signed, Cassell Clement,  MD  02/09/2013, 3:29 PM

## 2013-02-10 DIAGNOSIS — I517 Cardiomegaly: Secondary | ICD-10-CM

## 2013-02-10 DIAGNOSIS — K6389 Other specified diseases of intestine: Secondary | ICD-10-CM

## 2013-02-10 DIAGNOSIS — I2510 Atherosclerotic heart disease of native coronary artery without angina pectoris: Secondary | ICD-10-CM

## 2013-02-10 LAB — HEMOGLOBIN AND HEMATOCRIT, BLOOD
HCT: 26.3 % — ABNORMAL LOW (ref 39.0–52.0)
Hemoglobin: 7.9 g/dL — ABNORMAL LOW (ref 13.0–17.0)

## 2013-02-10 LAB — RENAL FUNCTION PANEL
CO2: 21 mEq/L (ref 19–32)
Calcium: 9.3 mg/dL (ref 8.4–10.5)
Chloride: 107 mEq/L (ref 96–112)
GFR calc Af Amer: 55 mL/min — ABNORMAL LOW (ref >90)
GFR calc non Af Amer: 47 mL/min — ABNORMAL LOW (ref >90)
Glucose, Bld: 91 mg/dL (ref 70–99)
Potassium: 4 mEq/L (ref 3.5–5.1)
Sodium: 139 mEq/L (ref 135–145)

## 2013-02-10 NOTE — Progress Notes (Signed)
Call placed to Dr. Donnie Aho informed patient having short runs of what appears to be atrial fib with PAC and PVC, heart rate 114, blood pressure 111/71  no C/O sob or chest pain.  Skin warm and dry.  Will continue to monitor, no new orders at this time.

## 2013-02-10 NOTE — Progress Notes (Signed)
I have seen and examined the pt and agree with PA-Jenning's progress note.  Cards eval pending Dr Lindie Spruce to s/w pt about surgery

## 2013-02-10 NOTE — Progress Notes (Signed)
  Echocardiogram 2D Echocardiogram has been performed.  Orianna Biskup 02/10/2013, 10:06 AM

## 2013-02-10 NOTE — Progress Notes (Signed)
Patient ID: Larry [name], male   DOB: [birth year], 62 y.o.   MRN: [id number] Subjective:  No chest pain or sob. Echo pending.  Objective:  Vital Signs in the last 24 hours: Temp:  [97.8 F (36.6 C)-98.8 F (37.1 C)] 97.8 F (36.6 C) (12/21 0500) Pulse Rate:  [74-82] 79 (12/21 0500) Resp:  [18] 18 (12/21 0500) BP: (98-111)/(59-71) 103/65 mmHg (12/21 0500) SpO2:  [96 %-99 %] 98 % (12/21 0500)  Intake/Output from previous day: 12/20 0701 - 12/21 0700 In: 720 [P.O.:720] Out: 3125 [Urine:3125] Intake/Output from this shift:    Physical Exam: Well appearing 62 yo man,NAD HEENT: Unremarkable Neck:  No JVD, no thyromegally Back:  No CVA tenderness Lungs:  Clear with no wheezes HEART:  Regular rate rhythm, no murmurs, no rubs, no clicks Abd:  soft, positive bowel sounds, no organomegally, no rebound, no guarding Ext:  2 plus pulses, no edema, no cyanosis, no clubbing Skin:  No rashes no nodules Neuro:  CN II through XII intact, motor grossly intact  Lab Results:  Recent Labs  02/08/13 0540 02/09/13 0540 02/10/13 0635  WBC 7.8 7.0  --   HGB 8.7* 8.0* 7.9*  PLT 167 189  --     Recent Labs  02/09/13 0540 02/10/13 0635  NA 137 139  K 4.0 4.0  CL 107 107  CO2 21 21  GLUCOSE 88 91  BUN 12 10  CREATININE 1.67* 1.55*   No results found for this basename: TROPONINI, CK, MB,  in the last 72 hours Hepatic Function Panel  Recent Labs  02/09/13 0540 02/10/13 0635  PROT 6.6  --   ALBUMIN 2.9* 2.8*  AST 9  --   ALT 14  --   ALKPHOS 79  --   BILITOT 0.8  --   BILIDIR 0.1  --   IBILI 0.7  --    No results found for this basename: CHOL,  in the last 72 hours No results found for this basename: PROTIME,  in the last 72 hours  Imaging: Ct Chest W Contrast  02/08/2013   CLINICAL DATA:  Mass in transverse colon  EXAM: CT CHEST, ABDOMEN, AND PELVIS WITH CONTRAST  TECHNIQUE: Multidetector CT imaging of the chest, abdomen and pelvis was performed following the  standard protocol during bolus administration of intravenous contrast.  CONTRAST:  OMNIPAQUE IOHEXOL 300 MG/ML  SOLN  COMPARISON:  100 cc of Omni 300  FINDINGS: CT CHEST FINDINGS  There is no pleural effusion identified. Subpleural nodule within the anterior right upper lobe measures 5 mm, image 33/series 3. Subpleural scar is identified in the right upper lobe, image 28/series 3.  The trachea appears patent and midline. The heart size is normal. Calcified atherosclerotic disease affects the thoracic aorta as well as the RCA, LAD and left circumflex coronary artery. No pericardial effusion noted. There is no mediastinal or hilar adenopathy. No axillary or supraclavicular adenopathy identified.  Review of the visualized bony structures is significant for mild multilevel thoracic spondylosis.  CT ABDOMEN AND PELVIS FINDINGS  There are several small, indeterminate low attenuation structures within the liver parenchyma. These are too small to characterize. Index hypodensity within the right hepatic lobe measures 6 mm, image number 58/series 2. Near the dome of liver there is a 5 mm hypodensity, image 51/series 2. Posterior right hepatic lobe hypodensity measures 5 mm, image 62/series 2. The gallbladder appears normal. There is no biliary dilatation. Normal appearance of the pancreas. The spleen is unremarkable.  Normal  appearance of the adrenal glands. Left renal cyst identified. Bilateral renal hypodensities are noted likely representing cysts. 3.8 cm diverticula arises from the right posterior bladder wall. Calcifications within the prostate gland are identified.  Calcified atherosclerotic disease affects the abdominal aorta. There is no aneurysm. No retroperitoneal adenopathy identified. No pelvic or inguinal adenopathy noted.  Normal appearance of the stomach. The small bowel loops are within normal limits circumferential mass involving the mid transverse colon is identified, image number 82/series 2. This  measures approximately 6 cm in length. There is increased caliber of the proximal large bowel suggesting partial obstruction. No ascites or peritoneal nodularity identified.  Review of the visualized osseous structures is significant for mild lumbar spondylosis. No aggressive lytic or sclerotic bone lesions identified.  IMPRESSION: CT chest:  1. Circumferential mass involving the mid transverse colon is identified compatible with colon carcinoma. 2. Subpleural nodule in the right upper lobe measures 5 mm. 3. Several low-attenuation foci are identified within the liver parenchyma. These are too small to reliably characterize. In a patient with a new diagnosis of colon cancer I would suggest more definitive characterization with contrast enhanced MRI of the liver.   Electronically Signed   By: Signa Kell M.D.   On: 02/08/2013 14:46   Ct Abdomen Pelvis W Contrast  02/08/2013   CLINICAL DATA:  Mass in transverse colon  EXAM: CT CHEST, ABDOMEN, AND PELVIS WITH CONTRAST  TECHNIQUE: Multidetector CT imaging of the chest, abdomen and pelvis was performed following the standard protocol during bolus administration of intravenous contrast.  CONTRAST:  OMNIPAQUE IOHEXOL 300 MG/ML  SOLN  COMPARISON:  100 cc of Omni 300  FINDINGS: CT CHEST FINDINGS  There is no pleural effusion identified. Subpleural nodule within the anterior right upper lobe measures 5 mm, image 33/series 3. Subpleural scar is identified in the right upper lobe, image 28/series 3.  The trachea appears patent and midline. The heart size is normal. Calcified atherosclerotic disease affects the thoracic aorta as well as the RCA, LAD and left circumflex coronary artery. No pericardial effusion noted. There is no mediastinal or hilar adenopathy. No axillary or supraclavicular adenopathy identified.  Review of the visualized bony structures is significant for mild multilevel thoracic spondylosis.  CT ABDOMEN AND PELVIS FINDINGS  There are several  small, indeterminate low attenuation structures within the liver parenchyma. These are too small to characterize. Index hypodensity within the right hepatic lobe measures 6 mm, image number 58/series 2. Near the dome of liver there is a 5 mm hypodensity, image 51/series 2. Posterior right hepatic lobe hypodensity measures 5 mm, image 62/series 2. The gallbladder appears normal. There is no biliary dilatation. Normal appearance of the pancreas. The spleen is unremarkable.  Normal appearance of the adrenal glands. Left renal cyst identified. Bilateral renal hypodensities are noted likely representing cysts. 3.8 cm diverticula arises from the right posterior bladder wall. Calcifications within the prostate gland are identified.  Calcified atherosclerotic disease affects the abdominal aorta. There is no aneurysm. No retroperitoneal adenopathy identified. No pelvic or inguinal adenopathy noted.  Normal appearance of the stomach. The small bowel loops are within normal limits circumferential mass involving the mid transverse colon is identified, image number 82/series 2. This measures approximately 6 cm in length. There is increased caliber of the proximal large bowel suggesting partial obstruction. No ascites or peritoneal nodularity identified.  Review of the visualized osseous structures is significant for mild lumbar spondylosis. No aggressive lytic or sclerotic bone lesions identified.  IMPRESSION: CT  chest:  1. Circumferential mass involving the mid transverse colon is identified compatible with colon carcinoma. 2. Subpleural nodule in the right upper lobe measures 5 mm. 3. Several low-attenuation foci are identified within the liver parenchyma. These are too small to reliably characterize. In a patient with a new diagnosis of colon cancer I would suggest more definitive characterization with contrast enhanced MRI of the liver.   Electronically Signed   By: Signa Kell M.D.   On: 02/08/2013 14:46    Cardiac  Studies: Tele - nsr Assessment/Plan:  1. Likely colon CA, awaiting surgery. Note results of CT scan. 2. CAD with LV dyfunction - 2D echo done yesterday and will be read today. If EF not severely depressed, would proceed with surgery. Low risk. If EF severely depressed, we would recommend left heart cath prior to assess risk for transverse colectomy.   LOS: 8 days    Gregg Taylor,M.D. 02/10/2013, 8:04 AM

## 2013-02-10 NOTE — Progress Notes (Signed)
[facility]                                                                                Patient Demographics  Larry [name], is a 62 y.o. male, DOB - 01-08-1951, NFA:[id number]  Admit date - 02/02/2013   Admitting Physician Hillary Bow, DO  Outpatient Primary MD for the patient is No primary provider on file.  LOS - 8   Chief Complaint  Patient presents with  . Weakness  . Near Syncope        Assessment & Plan   Principal Problem:   Acute renal failure Active Problems:   HYPERLIPIDEMIA   OBESITY   HYPERTENSION   CAD   CARDIOMYOPATHY, ISCHEMIC   ATRIAL TACHYCARDIA   C O P D   SLEEP APNEA, OBSTRUCTIVE   Hypotension   Chronic blood loss anemia   Atrial fibrillation   Colonic mass  Severe acute renal failure  -Improving, creatinine 1.55 -Renal ultrasound showed no hydronephrosis -May be secondary to medications versus dehydration  Atrial Tachycardia  -rates have been up to 130's at rest, currently controlled  -Continue Coreg and Cardizem  Hypotension  -Likely secondary to hypovolemia  -Resolved   Normocytic anemia  -H/H 7.9/26.3, stable, however patient has had dark stools and has had POCT positive -Multifactorial: GI related vs renal disease -Iron studies show Iron def anemia -Patient has received 4units PRBCs this admission -EGD showed antral erosions and duodenitis. -Colonoscopy showed obstructing colon mass, transverse colon. Diverticula, internal and external hemorrhoids  Colonic mass -Found on colonoscopy -CEA 5.2 -CT abd scan showed Circumferential mass involving the mid transverse colon is identified compatible with colon carcinoma. Severallow-attenuation foci are identified within the liver parenchyma.  -CT chest: Subpleural nodule in the right upper lobe measures 5 mm. -Surgery consulted -Cardiology consulted for pre-op eval.  Echo pending.  B12 and folate deficiency  -B12 level Oct 2014 was quit low at 251 - repeated this  admit 393 but given anemia, received loading dose -Folate low so was given IV and now continues po   Hyperlipidemia -treatment being held  Hx of diastolic CHF as well as ischemic CM/systolic CHF  -EF 45-50% via cath 2013  -Cardiology consulted -Echocardiogram pending  COPD  -Well compensated at present and not requiring any medication -Has Xopenex when necessary  Hx of CAD s/p PTCA w/ stents x 2 2010  -Asymptomatic (see above)   Obesity - Body mass index is 41.46 kg/(m^2).  Nausea -Will add Zofran when necessary  Code Status: Full  Family Communication: None at this time. Attempted to contact wife.  Disposition Plan: Admitted.   Procedures  -EGD showed antral erosions and duodenitis. -Colonoscopy showed obstructing colon mass, transverse colon. Diverticula, internal and external hemorrhoids -Echo  Consults   Gastroenterology Cardiology Surgery  DVT Prophylaxis  SCDs  Lab Results  Component Value Date   PLT 189 02/09/2013    Medications  Scheduled Meds: . carvedilol  12.5 mg Oral BID WC  . diltiazem  120 mg Oral Daily  . docusate sodium  100 mg Oral BID  . enoxaparin (LOVENOX) injection  40 mg Subcutaneous Q24H  . feeding supplement (ENSURE COMPLETE)  237 mL  Oral TID BM  . folic acid  1 mg Oral Daily  . sodium chloride  3 mL Intravenous Q12H   Continuous Infusions:   PRN Meds:.levalbuterol, ondansetron (ZOFRAN) IV  Antibiotics    Anti-infectives   None     Time Spent in minutes   25 minutes   Wylee Dorantes D.O. on 02/10/2013 at 10:49 AM  Between 7am to 7pm - Pager - [phone]  After 7pm go to [email or web address] - password [email or web address]  And look for the night coverage person covering for me after hours  [facility] Office  [phone]    Subjective:   [name] seen and examined today.  Patient has no complaints.  He denies any dizziness, chest pain, shortness of breath, abdominal pain.   Objective:   Filed Vitals:    02/09/13 1400 02/09/13 1800 02/09/13 2100 02/10/13 0500  BP: 102/60 111/71 98/59 103/65  Pulse: 74 82 82 79  Temp: 98.4 F (36.9 C) 98 F (36.7 C) 98.2 F (36.8 C) 97.8 F (36.6 C)  TempSrc: Oral Oral Oral Oral  Resp: 18 18 18 18   Height:      Weight:      SpO2: 96% 98% 99% 98%    Wt Readings from Last 3 Encounters:  02/08/13 134.3 kg (296 lb 1.2 oz)  02/08/13 134.3 kg (296 lb 1.2 oz)  12/06/12 147.873 kg (326 lb)     Intake/Output Summary (Last 24 hours) at 02/10/13 1049 Last data filed at 02/10/13 2956  Gross per 24 hour  Intake    480 ml  Output   2925 ml  Net  -2445 ml    Exam  General: Well developed, well nourished, NAD, appears stated age  HEENT: NCAT, PERRLA, EOMI, Anicteic Sclera, mucous membranes moist.   Neck: Supple, no JVD, no masses  Cardiovascular: S1 S2 auscultated, no rubs, murmurs or gallops. Regular rate and rhythm.  Respiratory: Clear to auscultation bilaterally with equal chest rise  Abdomen: Soft, obese, nontender, nondistended, + bowel sounds  Extremities: warm dry without cyanosis clubbing or edema  Neuro: AAOx3, cranial nerves grossly intact.   Skin: Without rashes exudates or nodules  Psych: Normal affect and demeanor with intact judgement and insight  Data Review   Micro Results Recent Results (from the past 240 hour(s))  MRSA PCR SCREENING     Status: None   Collection Time    02/02/13 10:35 PM      Result Value Range Status   MRSA by PCR NEGATIVE  NEGATIVE Final   Comment:            The GeneXpert MRSA Assay (FDA     approved for NASAL specimens     only), is one component of a     comprehensive MRSA colonization     surveillance program. It is not     intended to diagnose MRSA     infection nor to guide or     monitor treatment for     MRSA infections.    Radiology Reports US Renal  02/02/2013   CLINICAL DATA:  Renal failure  EXAM: RENAL/URINARY TRACT ULTRASOUND COMPLETE  COMPARISON:  None.  FINDINGS: Right  Kidney:  Length: 11.6 cm. Echogenic renal parenchyma, suggesting medical renal disease. 1.4 x 1.1 x 1.2 cm upper pole cyst. No hydronephrosis.  Left Kidney:  Length: 11.7 cm. Echogenic renal parenchyma, suggesting medical renal disease. 2.1 x 2.0 x 2.0 cm upper pole cyst. No hydronephrosis.  Bladder:  Decompressed by indwelling Foley  catheter.  IMPRESSION: Echogenic renal parenchyma, suggesting medical renal disease.  Bilateral renal cysts.  No hydronephrosis.   Electronically Signed   By: Charline Bills M.D.   On: 02/02/2013 20:59   Dg Chest Portable 1 View  02/02/2013   CLINICAL DATA:  Weakness, presyncope  EXAM: PORTABLE CHEST - 1 VIEW  COMPARISON:  06/16/2011  FINDINGS: Mildly low lung volumes. There is mild elevation the right hemidiaphragm. Cardiac silhouette is within normal limits. There is no evidence of focal infiltrates, effusions, nor edema. Dextroscoliosis identified within the thoracic spine.  IMPRESSION: No evidence of acute cardiopulmonary disease.   Electronically Signed   By: Salome Holmes M.D.   On: 02/02/2013 17:34    CBC  Recent Labs Lab 02/04/13 0345 02/05/13 0525 02/07/13 0532 02/08/13 0540 02/09/13 0540 02/10/13 0635  WBC 7.9 7.4 8.1 7.8 7.0  --   HGB 7.3* 8.1* 8.0* 8.7* 8.0* 7.9*  HCT 22.8* 25.8* 25.8* 28.3* 26.0* 26.3*  PLT 160 158 167 167 189  --   MCV 86.7 88.4 90.2 91.6 91.2  --   MCH 27.8 27.7 28.0 28.2 28.1  --   MCHC 32.0 31.4 31.0 30.7 30.8  --   RDW 19.2* 18.1* 18.8* 18.9* 18.5*  --     Chemistries   Recent Labs Lab 02/06/13 0430 02/07/13 0532 02/08/13 0540 02/09/13 0540 02/10/13 0635  NA 140 141 141 137 139  K 4.9 4.2 4.7 4.0 4.0  CL 112 112 112 107 107  CO2 18* 20 19 21 21   GLUCOSE 90 105* 82 88 91  BUN 26* 20 16 12 10   CREATININE 2.28* 1.99* 1.75* 1.67* 1.55*  CALCIUM 8.9 8.9 9.2 9.2 9.3  AST  --   --   --  9  --   ALT  --   --   --  14  --   ALKPHOS  --   --   --  79  --   BILITOT  --   --   --  0.8  --     ------------------------------------------------------------------------------------------------------------------ estimated creatinine clearance is 71.3 ml/min (by C-G formula based on Cr of 1.55). ------------------------------------------------------------------------------------------------------------------ No results found for this basename: HGBA1C,  in the last 72 hours ------------------------------------------------------------------------------------------------------------------ No results found for this basename: CHOL, HDL, LDLCALC, TRIG, CHOLHDL, LDLDIRECT,  in the last 72 hours ------------------------------------------------------------------------------------------------------------------ No results found for this basename: TSH, T4TOTAL, FREET3, T3FREE, THYROIDAB,  in the last 72 hours ------------------------------------------------------------------------------------------------------------------ No results found for this basename: VITAMINB12, FOLATE, FERRITIN, TIBC, IRON, RETICCTPCT,  in the last 72 hours  Coagulation profile No results found for this basename: INR, PROTIME,  in the last 168 hours  No results found for this basename: DDIMER,  in the last 72 hours  Cardiac Enzymes No results found for this basename: CK, CKMB, TROPONINI, MYOGLOBIN,  in the last 168 hours ------------------------------------------------------------------------------------------------------------------ No components found with this basename: POCBNP,

## 2013-02-10 NOTE — Progress Notes (Signed)
2 Days Post-Op  Subjective: No pain and tolerating clears well.  Objective: Vital signs in last 24 hours: Temp:  [97.8 F (36.6 C)-98.8 F (37.1 C)] 97.8 F (36.6 C) (12/21 0500) Pulse Rate:  [74-82] 79 (12/21 0500) Resp:  [18] 18 (12/21 0500) BP: (98-111)/(59-71) 103/65 mmHg (12/21 0500) SpO2:  [96 %-99 %] 98 % (12/21 0500) Last BM Date: 02/08/13 720 Po recorded. No BM recorded. Afebrile, VSS Creatinine is down to 1.55 H/H is stable. CT scans chest and abdomen:  1. Circumferential mass involving the mid transverse colon is identified compatible with colon carcinoma. 2. Subpleural nodule in the right upper lobe measures 5 mm. 3. Several low-attenuation foci are identified within the liver parenchyma. These are too small to reliably characterize. In a patient with a new diagnosis of colon cancer I would suggest more definitive characterization with contrast enhanced MRI of the liver.   Intake/Output from previous day: 12/20 0701 - 12/21 0700 In: 720 [P.O.:720] Out: 3125 [Urine:3125] Intake/Output this shift:    General appearance: alert, cooperative and no distress Resp: clear to auscultation bilaterally GI: soft, non-tender; bowel sounds normal; no masses,  no organomegaly and very large nontender abdomen  Lab Results:   Recent Labs  02/08/13 0540 02/09/13 0540 02/10/13 0635  WBC 7.8 7.0  --   HGB 8.7* 8.0* 7.9*  HCT 28.3* 26.0* 26.3*  PLT 167 189  --     BMET  Recent Labs  02/09/13 0540 02/10/13 0635  NA 137 139  K 4.0 4.0  CL 107 107  CO2 21 21  GLUCOSE 88 91  BUN 12 10  CREATININE 1.67* 1.55*  CALCIUM 9.2 9.3   PT/INR No results found for this basename: LABPROT, INR,  in the last 72 hours   Recent Labs Lab 02/04/13 0345 02/09/13 0540 02/10/13 0635  AST  --  9  --   ALT  --  14  --   ALKPHOS  --  79  --   BILITOT  --  0.8  --   PROT  --  6.6  --   ALBUMIN 2.8* 2.9* 2.8*     Lipase     Component Value Date/Time   LIPASE 15  11/05/2008 1215     Studies/Results: Ct Chest W Contrast  02/08/2013   CLINICAL DATA:  Mass in transverse colon  EXAM: CT CHEST, ABDOMEN, AND PELVIS WITH CONTRAST  TECHNIQUE: Multidetector CT imaging of the chest, abdomen and pelvis was performed following the standard protocol during bolus administration of intravenous contrast.  CONTRAST:  OMNIPAQUE IOHEXOL 300 MG/ML  SOLN  COMPARISON:  100 cc of Omni 300  FINDINGS: CT CHEST FINDINGS  There is no pleural effusion identified. Subpleural nodule within the anterior right upper lobe measures 5 mm, image 33/series 3. Subpleural scar is identified in the right upper lobe, image 28/series 3.  The trachea appears patent and midline. The heart size is normal. Calcified atherosclerotic disease affects the thoracic aorta as well as the RCA, LAD and left circumflex coronary artery. No pericardial effusion noted. There is no mediastinal or hilar adenopathy. No axillary or supraclavicular adenopathy identified.  Review of the visualized bony structures is significant for mild multilevel thoracic spondylosis.  CT ABDOMEN AND PELVIS FINDINGS  There are several small, indeterminate low attenuation structures within the liver parenchyma. These are too small to characterize. Index hypodensity within the right hepatic lobe measures 6 mm, image number 58/series 2. Near the dome of liver there is a 5  mm hypodensity, image 51/series 2. Posterior right hepatic lobe hypodensity measures 5 mm, image 62/series 2. The gallbladder appears normal. There is no biliary dilatation. Normal appearance of the pancreas. The spleen is unremarkable.  Normal appearance of the adrenal glands. Left renal cyst identified. Bilateral renal hypodensities are noted likely representing cysts. 3.8 cm diverticula arises from the right posterior bladder wall. Calcifications within the prostate gland are identified.  Calcified atherosclerotic disease affects the abdominal aorta. There is no aneurysm. No  retroperitoneal adenopathy identified. No pelvic or inguinal adenopathy noted.  Normal appearance of the stomach. The small bowel loops are within normal limits circumferential mass involving the mid transverse colon is identified, image number 82/series 2. This measures approximately 6 cm in length. There is increased caliber of the proximal large bowel suggesting partial obstruction. No ascites or peritoneal nodularity identified.  Review of the visualized osseous structures is significant for mild lumbar spondylosis. No aggressive lytic or sclerotic bone lesions identified.  IMPRESSION: CT chest:  1. Circumferential mass involving the mid transverse colon is identified compatible with colon carcinoma. 2. Subpleural nodule in the right upper lobe measures 5 mm. 3. Several low-attenuation foci are identified within the liver parenchyma. These are too small to reliably characterize. In a patient with a new diagnosis of colon cancer I would suggest more definitive characterization with contrast enhanced MRI of the liver.   Electronically Signed   By: Signa Kell M.D.   On: 02/08/2013 14:46   Ct Abdomen Pelvis W Contrast  02/08/2013   CLINICAL DATA:  Mass in transverse colon  EXAM: CT CHEST, ABDOMEN, AND PELVIS WITH CONTRAST  TECHNIQUE: Multidetector CT imaging of the chest, abdomen and pelvis was performed following the standard protocol during bolus administration of intravenous contrast.  CONTRAST:  OMNIPAQUE IOHEXOL 300 MG/ML  SOLN  COMPARISON:  100 cc of Omni 300  FINDINGS: CT CHEST FINDINGS  There is no pleural effusion identified. Subpleural nodule within the anterior right upper lobe measures 5 mm, image 33/series 3. Subpleural scar is identified in the right upper lobe, image 28/series 3.  The trachea appears patent and midline. The heart size is normal. Calcified atherosclerotic disease affects the thoracic aorta as well as the RCA, LAD and left circumflex coronary artery. No pericardial  effusion noted. There is no mediastinal or hilar adenopathy. No axillary or supraclavicular adenopathy identified.  Review of the visualized bony structures is significant for mild multilevel thoracic spondylosis.  CT ABDOMEN AND PELVIS FINDINGS  There are several small, indeterminate low attenuation structures within the liver parenchyma. These are too small to characterize. Index hypodensity within the right hepatic lobe measures 6 mm, image number 58/series 2. Near the dome of liver there is a 5 mm hypodensity, image 51/series 2. Posterior right hepatic lobe hypodensity measures 5 mm, image 62/series 2. The gallbladder appears normal. There is no biliary dilatation. Normal appearance of the pancreas. The spleen is unremarkable.  Normal appearance of the adrenal glands. Left renal cyst identified. Bilateral renal hypodensities are noted likely representing cysts. 3.8 cm diverticula arises from the right posterior bladder wall. Calcifications within the prostate gland are identified.  Calcified atherosclerotic disease affects the abdominal aorta. There is no aneurysm. No retroperitoneal adenopathy identified. No pelvic or inguinal adenopathy noted.  Normal appearance of the stomach. The small bowel loops are within normal limits circumferential mass involving the mid transverse colon is identified, image number 82/series 2. This measures approximately 6 cm in length. There is increased caliber of the  proximal large bowel suggesting partial obstruction. No ascites or peritoneal nodularity identified.  Review of the visualized osseous structures is significant for mild lumbar spondylosis. No aggressive lytic or sclerotic bone lesions identified.  IMPRESSION: CT chest:  1. Circumferential mass involving the mid transverse colon is identified compatible with colon carcinoma. 2. Subpleural nodule in the right upper lobe measures 5 mm. 3. Several low-attenuation foci are identified within the liver parenchyma. These are  too small to reliably characterize. In a patient with a new diagnosis of colon cancer I would suggest more definitive characterization with contrast enhanced MRI of the liver.   Electronically Signed   By: Signa Kell M.D.   On: 02/08/2013 14:46    Medications: . carvedilol  12.5 mg Oral BID WC  . diltiazem  120 mg Oral Daily  . docusate sodium  100 mg Oral BID  . enoxaparin (LOVENOX) injection  40 mg Subcutaneous Q24H  . feeding supplement (ENSURE COMPLETE)  237 mL Oral TID BM  . folic acid  1 mg Oral Daily  . sodium chloride  3 mL Intravenous Q12H    Assessment/Plan 1. Obstructing colon mass. (No BM for 2 weeks)  Path still pending 2. Acute renal failure  3. Anemia with GI blood loss.  4. CAD with CM; EF 345-50% 05/2011.  5. Hx of 2 RCA stents 2010./hx of atrial tachycardia/NSVT on BB and CCB's.  6. COPD/tobacco use Hx.  7. Hx of ETOH heavy use 20+ years, quit 2010.  8. Obesity Body mass index is 41.6.  9. Dyslipidemia  10. Deconditioning/malnutrition, he has been inactive for 5 weeks and not eating.   Plan:  Echo and cardiology recommendations still pending. Path is also pending.  Dr. Lindie Spruce is on next week and will be able to talk to him about surgery tomorrow.   LOS: 8 days    Larry [name] 02/10/2013

## 2013-02-11 ENCOUNTER — Encounter (HOSPITAL_COMMUNITY): Payer: Self-pay | Admitting: Gastroenterology

## 2013-02-11 DIAGNOSIS — I2589 Other forms of chronic ischemic heart disease: Secondary | ICD-10-CM

## 2013-02-11 DIAGNOSIS — R000 Tachycardia, unspecified: Secondary | ICD-10-CM

## 2013-02-11 LAB — CBC
MCV: 90.6 fL (ref 78.0–100.0)
Platelets: 183 10*3/uL (ref 150–400)
RBC: 2.78 MIL/uL — ABNORMAL LOW (ref 4.22–5.81)
RDW: 18.2 % — ABNORMAL HIGH (ref 11.5–15.5)
WBC: 6.8 10*3/uL (ref 4.0–10.5)

## 2013-02-11 LAB — BASIC METABOLIC PANEL
BUN: 8 mg/dL (ref 6–23)
CO2: 22 mEq/L (ref 19–32)
Chloride: 106 mEq/L (ref 96–112)
Creatinine, Ser: 1.48 mg/dL — ABNORMAL HIGH (ref 0.50–1.35)
GFR calc Af Amer: 58 mL/min — ABNORMAL LOW (ref >90)
Potassium: 4 mEq/L (ref 3.5–5.1)
Sodium: 137 mEq/L (ref 135–145)

## 2013-02-11 MED ORDER — DEXTROSE 5 % IV SOLN
2.0000 g | INTRAVENOUS | Status: AC
  Administered 2013-02-12: 2 g via INTRAVENOUS
  Filled 2013-02-11 (×2): qty 2

## 2013-02-11 MED ORDER — PERFLUTREN LIPID MICROSPHERE
1.0000 mL | INTRAVENOUS | Status: AC | PRN
  Administered 2013-02-11: 6 mL via INTRAVENOUS
  Filled 2013-02-11: qty 10

## 2013-02-11 NOTE — Progress Notes (Signed)
CMT Kedra notified that patient has returned from HD. Will continue to monitor.

## 2013-02-11 NOTE — Progress Notes (Signed)
Pathology is still pending.  Probably will need colostomy.  Will have the patient marked for possible right transverse or ascending colostomy.  Marta Lamas. Gae Bon, MD, FACS [phone] [facility] Surgery

## 2013-02-11 NOTE — Consult Note (Signed)
WOC consulted to mark patient preoperatively.  Requested to mark ascending or right transverse colostomy site.  Explained stoma creation and need for surgery.  Provided samples of ostomy pouch and educational booklet.  Examined pt. Lying, sitting, and standing.  Pt with large, somewhat distended abdomen. Pt wears his pants extremely low, under a large pannus.  Marked him in the RLQ (ascending colon) 7cm to the right of his umbilicus.  Marked him in the RUQ (right transverse) 4cm to the right and 2cm upwards from the umbilicus. Within the rectus muscle and in the patients visual field.   WOC team to follow up after his surgery, which is planned for tomorrow.    Rebeca Valdivia [place], [place] [phone]

## 2013-02-11 NOTE — Progress Notes (Signed)
Patient signed surgical consent, placed in chart.

## 2013-02-11 NOTE — Progress Notes (Signed)
[facility]                                                                                Patient Demographics  Larry [name], is a 62 y.o. male, DOB - [birth year], ZOX:[id number]  Admit date - 02/02/2013   Admitting Physician Hillary Bow, DO  Outpatient Primary MD for the patient is No primary provider on file.  LOS - 9   Chief Complaint  Patient presents with  . Weakness  . Near Syncope        Assessment & Plan   Principal Problem:   Acute renal failure Active Problems:   HYPERLIPIDEMIA   OBESITY   HYPERTENSION   CAD   CARDIOMYOPATHY, ISCHEMIC   ATRIAL TACHYCARDIA   C O P D   SLEEP APNEA, OBSTRUCTIVE   Hypotension   Chronic blood loss anemia   Atrial fibrillation   Colonic mass  Severe acute renal failure  -Improving, creatinine 1.42 -Renal ultrasound showed no hydronephrosis -May be secondary to medications versus dehydration  Atrial Tachycardia  -rates have been up to 130's at rest, currently controlled  -Continue Coreg and Cardizem  Hypotension  -Likely secondary to hypovolemia  -Resolved   Normocytic anemia  -H/H 7.8/25.2, stable, however patient has had dark stools and has had POCT positive -Multifactorial: GI related vs renal disease -Iron studies show Iron def anemia -Patient has received 4units PRBCs this admission -EGD showed antral erosions and duodenitis. -Colonoscopy showed obstructing colon mass, transverse colon. Diverticula, internal and external hemorrhoids  Colonic mass -Found on colonoscopy -CEA 5.2 -CT abd scan showed Circumferential mass involving the mid transverse colon is identified compatible with colon carcinoma. Severallow-attenuation foci are identified within the liver parenchyma.  -CT chest: Subpleural nodule in the right upper lobe measures 5 mm. -Surgery consulted -Cardiology consulted for pre-op eval.    B12 and folate deficiency  -B12 level Oct 2014 was quit low at 251 - repeated this admit 393 but  given anemia, received loading dose -Folate low so was given IV and now continues po   Hyperlipidemia -treatment being held  Hx of diastolic CHF as well as ischemic CM/systolic CHF  -EF 45-50% via cath 2013  -Cardiology consulted  COPD  -Well compensated at present and not requiring any medication -Has Xopenex when necessary  Hx of CAD s/p PTCA w/ stents x 2 2010  -Asymptomatic (see above)   Obesity - Body mass index is 41.46 kg/(m^2).  Nausea -Will add Zofran when necessary  Code Status: Full  Family Communication: None at this time. Attempted to contact wife.  Disposition Plan: Admitted.   Procedures  -EGD showed antral erosions and duodenitis. -Colonoscopy showed obstructing colon mass, transverse colon.  Diverticula, internal and external hemorrhoids  -Echocardiogram Study Conclusions - Left ventricle: The cavity size was mildly dilated. Wall thickness was increased in a pattern of mild LVH. Systolic function was mildly reduced. The estimated ejection fraction was in the range of 45% to 50%. Doppler parameters are consistent with abnormal left ventricular relaxation (grade 1 diastolic dysfunction).  - Atrial septum: No defect or patent foramen ovale was identified. - Impressions: This study was technically challenging.  Endocardium is not well visualized.  The left ventricular ejection fraction is low normal. There is highly echogenic echodensity in the left ventricular apex. While with no regional wall motion abnormalities in the apex a thrombus is not common, a repeated echo with echocontrast is recommended.  Consults   Gastroenterology Cardiology Surgery  DVT Prophylaxis  SCDs  Lab Results  Component Value Date   PLT 183 02/11/2013    Medications  Scheduled Meds: . carvedilol  12.5 mg Oral BID WC  . diltiazem  120 mg Oral Daily  . docusate sodium  100 mg Oral BID  . enoxaparin (LOVENOX) injection  40 mg Subcutaneous Q24H  . feeding supplement (ENSURE  COMPLETE)  237 mL Oral TID BM  . folic acid  1 mg Oral Daily  . sodium chloride  3 mL Intravenous Q12H   Continuous Infusions:   PRN Meds:.levalbuterol, ondansetron (ZOFRAN) IV  Antibiotics    Anti-infectives   None     Time Spent in minutes   25 minutes   Karalyne Nusser D.O. on 02/11/2013 at 12:51 PM  Between 7am to 7pm - Pager - [phone]  After 7pm go to [email or web address] - password [email or web address]  And look for the night coverage person covering for me after hours  [facility] Office  [phone]    Subjective:   [name] seen and examined today.  Patient has no complaints.  He denies any dizziness, chest pain, shortness of breath, abdominal pain.   Objective:   Filed Vitals:   02/10/13 1739 02/10/13 2128 02/11/13 0500 02/11/13 0959  BP: 108/71 105/68 122/73 105/64  Pulse: 98 89 79 82  Temp: 98 F (36.7 C) 98.4 F (36.9 C) 98.6 F (37 C) 98.2 F (36.8 C)  TempSrc: Oral Oral Oral Oral  Resp: 16 18 16 20   Height:      Weight:      SpO2: 99% 98% 98% 100%    Wt Readings from Last 3 Encounters:  02/08/13 134.3 kg (296 lb 1.2 oz)  02/08/13 134.3 kg (296 lb 1.2 oz)  12/06/12 147.873 kg (326 lb)     Intake/Output Summary (Last 24 hours) at 02/11/13 1251 Last data filed at 02/11/13 1100  Gross per 24 hour  Intake    480 ml  Output   2075 ml  Net  -1595 ml    Exam  General: Well developed, well nourished, NAD, appears stated age  HEENT: NCAT, PERRLA, EOMI, Anicteic Sclera, mucous membranes moist.   Neck: Supple, no JVD, no masses  Cardiovascular: S1 S2 auscultated, no rubs, murmurs or gallops. Regular rate and rhythm.  Respiratory: Clear to auscultation bilaterally with equal chest rise  Abdomen: Soft, obese, nontender, nondistended, + bowel sounds  Extremities: warm dry without cyanosis clubbing or edema  Neuro: AAOx3, cranial nerves grossly intact.   Skin: Without rashes exudates or nodules  Psych: Normal affect and  demeanor with intact judgement and insight  Data Review   Micro Results Recent Results (from the past 240 hour(s))  MRSA PCR SCREENING     Status: None   Collection Time    02/02/13 10:35 PM      Result Value Range Status   MRSA by PCR NEGATIVE  NEGATIVE Final   Comment:            The GeneXpert MRSA Assay (FDA     approved for NASAL specimens     only), is one component of a     comprehensive MRSA colonization     surveillance program. It is  not     intended to diagnose MRSA     infection nor to guide or     monitor treatment for     MRSA infections.    Radiology Reports US Renal  02/02/2013   CLINICAL DATA:  Renal failure  EXAM: RENAL/URINARY TRACT ULTRASOUND COMPLETE  COMPARISON:  None.  FINDINGS: Right Kidney:  Length: 11.6 cm. Echogenic renal parenchyma, suggesting medical renal disease. 1.4 x 1.1 x 1.2 cm upper pole cyst. No hydronephrosis.  Left Kidney:  Length: 11.7 cm. Echogenic renal parenchyma, suggesting medical renal disease. 2.1 x 2.0 x 2.0 cm upper pole cyst. No hydronephrosis.  Bladder:  Decompressed by indwelling Foley catheter.  IMPRESSION: Echogenic renal parenchyma, suggesting medical renal disease.  Bilateral renal cysts.  No hydronephrosis.   Electronically Signed   By: Charline Bills M.D.   On: 02/02/2013 20:59   Dg Chest Portable 1 View  02/02/2013   CLINICAL DATA:  Weakness, presyncope  EXAM: PORTABLE CHEST - 1 VIEW  COMPARISON:  06/16/2011  FINDINGS: Mildly low lung volumes. There is mild elevation the right hemidiaphragm. Cardiac silhouette is within normal limits. There is no evidence of focal infiltrates, effusions, nor edema. Dextroscoliosis identified within the thoracic spine.  IMPRESSION: No evidence of acute cardiopulmonary disease.   Electronically Signed   By: Salome Holmes M.D.   On: 02/02/2013 17:34    CBC  Recent Labs Lab 02/05/13 0525 02/07/13 0532 02/08/13 0540 02/09/13 0540 02/10/13 0635 02/11/13 0621  WBC 7.4 8.1 7.8 7.0  --   6.8  HGB 8.1* 8.0* 8.7* 8.0* 7.9* 7.8*  HCT 25.8* 25.8* 28.3* 26.0* 26.3* 25.2*  PLT 158 167 167 189  --  183  MCV 88.4 90.2 91.6 91.2  --  90.6  MCH 27.7 28.0 28.2 28.1  --  28.1  MCHC 31.4 31.0 30.7 30.8  --  31.0  RDW 18.1* 18.8* 18.9* 18.5*  --  18.2*    Chemistries   Recent Labs Lab 02/07/13 0532 02/08/13 0540 02/09/13 0540 02/10/13 0635 02/11/13 0621  NA 141 141 137 139 137  K 4.2 4.7 4.0 4.0 4.0  CL 112 112 107 107 106  CO2 20 19 21 21 22   GLUCOSE 105* 82 88 91 87  BUN 20 16 12 10 8   CREATININE 1.99* 1.75* 1.67* 1.55* 1.48*  CALCIUM 8.9 9.2 9.2 9.3 9.3  AST  --   --  9  --   --   ALT  --   --  14  --   --   ALKPHOS  --   --  79  --   --   BILITOT  --   --  0.8  --   --    ------------------------------------------------------------------------------------------------------------------ estimated creatinine clearance is 74.6 ml/min (by C-G formula based on Cr of 1.48). ------------------------------------------------------------------------------------------------------------------ No results found for this basename: HGBA1C,  in the last 72 hours ------------------------------------------------------------------------------------------------------------------ No results found for this basename: CHOL, HDL, LDLCALC, TRIG, CHOLHDL, LDLDIRECT,  in the last 72 hours ------------------------------------------------------------------------------------------------------------------ No results found for this basename: TSH, T4TOTAL, FREET3, T3FREE, THYROIDAB,  in the last 72 hours ------------------------------------------------------------------------------------------------------------------ No results found for this basename: VITAMINB12, FOLATE, FERRITIN, TIBC, IRON, RETICCTPCT,  in the last 72 hours  Coagulation profile No results found for this basename: INR, PROTIME,  in the last 168 hours  No results found for this basename: DDIMER,  in the last 72 hours  Cardiac  Enzymes No results found for this basename: CK, CKMB, TROPONINI, MYOGLOBIN,  in  the last 168 hours ------------------------------------------------------------------------------------------------------------------ No components found with this basename: POCBNP,

## 2013-02-11 NOTE — Progress Notes (Signed)
Patient ID: Larry [name], male   DOB: 1950-03-14, 62 y.o.   MRN: [id number] Subjective:  No chest pain or sob.   Objective:  Vital Signs in the last 24 hours: Temp:  [98 F (36.7 C)-98.6 F (37 C)] 98.6 F (37 C) (12/22 0500) Pulse Rate:  [79-98] 79 (12/22 0500) Resp:  [16-18] 16 (12/22 0500) BP: (105-122)/(66-73) 122/73 mmHg (12/22 0500) SpO2:  [98 %-99 %] 98 % (12/22 0500)  Intake/Output from previous day: 12/21 0701 - 12/22 0700 In: 600 [P.O.:600] Out: 1900 [Urine:1900] Intake/Output from this shift:    Physical Exam: Well appearing 62 yo man,NAD HEENT: Unremarkable Neck:  Supple Lungs:  CTA HEART:  Regular rate rhythm Abd:  soft, positive bowel sounds Ext:  no edema Skin:  No rashes no nodules Neuro:  CN II through XII intact, motor grossly intact  Lab Results:  Recent Labs  02/09/13 0540 02/10/13 0635 02/11/13 0621  WBC 7.0  --  6.8  HGB 8.0* 7.9* 7.8*  PLT 189  --  183    Recent Labs  02/10/13 0635 02/11/13 0621  NA 139 137  K 4.0 4.0  CL 107 106  CO2 21 22  GLUCOSE 91 87  BUN 10 8  CREATININE 1.55* 1.48*   Hepatic Function Panel  Recent Labs  02/09/13 0540 02/10/13 0635  PROT 6.6  --   ALBUMIN 2.9* 2.8*  AST 9  --   ALT 14  --   ALKPHOS 79  --   BILITOT 0.8  --   BILIDIR 0.1  --   IBILI 0.7  --     Cardiac Studies: Tele - nsr with PAT Assessment/Plan:  1. Likely colon CA, awaiting surgery.  2. CAD with LV dyfunction -echo shows mildly reduced EF which is unchanged; would proceed with surgery. There is mild increased risk given H/O CAD.   3. SVT-PAT noted on telemetry; continue beta blocker.  LOS: 9 days    Brian Crenshaw,M.D. 02/11/2013, 9:39 AM

## 2013-02-11 NOTE — Progress Notes (Signed)
PT Cancellation Note  Patient Details Name: Larry [name] MRN: [id number] DOB: [birth year]   Cancelled Treatment:    Reason Eval/Treat Not Completed: Patient declined, no reason specified. Pt refusing to get OOB with therapy today. Encouraged OOB activity to reduce blood clots and deconditioning; pt continued to refuse. Will re-attempt at next available time. Pt planned for OR tomorrow.    Donnamarie Poag [place], [place] [phone] 02/11/2013, 10:21 AM

## 2013-02-11 NOTE — Progress Notes (Signed)
  Echocardiogram 2D Echocardiogram limited with Definity has been performed.  Larry [name] 02/11/2013, 3:47 PM

## 2013-02-11 NOTE — Progress Notes (Signed)
Spoke with the patient about his pathology.  He will need a transverse colectomy with likely colostomy.  Scheduled for tomorrow.  Marta Lamas. Gae Bon, MD, FACS [phone] [facility] Surgery

## 2013-02-11 NOTE — Progress Notes (Signed)
3 Days Post-Op  Subjective: No real change OK with clears  Objective: Vital signs in last 24 hours: Temp:  [98 F (36.7 C)-98.6 F (37 C)] 98.6 F (37 C) (12/22 0500) Pulse Rate:  [79-98] 79 (12/22 0500) Resp:  [16-18] 16 (12/22 0500) BP: (105-122)/(66-73) 122/73 mmHg (12/22 0500) SpO2:  [98 %-99 %] 98 % (12/22 0500) Last BM Date: 02/08/13 600 Po yesterday No BM recorded. Afebrile, VSS Creatinine is better, H/H low but stable.  Intake/Output from previous day: 12/21 0701 - 12/22 0700 In: 600 [P.O.:600] Out: 1900 [Urine:1900] Intake/Output this shift: Total I/O In: 240 [P.O.:240] Out: -   General appearance: alert, cooperative and no distress GI: soft very large abdomen, no pain + BS  Lab Results:   Recent Labs  02/09/13 0540 02/10/13 0635 02/11/13 0621  WBC 7.0  --  6.8  HGB 8.0* 7.9* 7.8*  HCT 26.0* 26.3* 25.2*  PLT 189  --  183    BMET  Recent Labs  02/10/13 0635 02/11/13 0621  NA 139 137  K 4.0 4.0  CL 107 106  CO2 21 22  GLUCOSE 91 87  BUN 10 8  CREATININE 1.55* 1.48*  CALCIUM 9.3 9.3   PT/INR No results found for this basename: LABPROT, INR,  in the last 72 hours   Recent Labs Lab 02/09/13 0540 02/10/13 0635  AST 9  --   ALT 14  --   ALKPHOS 79  --   BILITOT 0.8  --   PROT 6.6  --   ALBUMIN 2.9* 2.8*     Lipase     Component Value Date/Time   LIPASE 15 11/05/2008 1215     Studies/Results: No results found.  Medications: . carvedilol  12.5 mg Oral BID WC  . diltiazem  120 mg Oral Daily  . docusate sodium  100 mg Oral BID  . enoxaparin (LOVENOX) injection  40 mg Subcutaneous Q24H  . feeding supplement (ENSURE COMPLETE)  237 mL Oral TID BM  . folic acid  1 mg Oral Daily  . sodium chloride  3 mL Intravenous Q12H    Assessment/Plan 1. Obstructing colon mass. (No BM for 2 weeks) Path still pending  2. Acute renal failure  3. Anemia with GI blood loss.  4. CAD with CM; EF 345-50% 05/2011.  5. Hx of 2 RCA stents 2010./hx  of atrial tachycardia/NSVT on BB and CCB's.  6. COPD/tobacco use Hx.  7. Hx of ETOH heavy use 20+ years, quit 2010.  8. Obesity Body mass index is 41.6.  9. Dyslipidemia  10. Deconditioning/malnutrition, he has been inactive for 5 weeks and not eating.   Plan:  EF 45-50%, Ok with cardiology to proceed with surgery.  Dr. Lindie Spruce will talk with him about timing later today. Check labs type and screen in AM.       LOS: 9 days    Briunna Leicht 02/11/2013

## 2013-02-12 ENCOUNTER — Encounter (HOSPITAL_COMMUNITY): Payer: Medicare Other | Admitting: Critical Care Medicine

## 2013-02-12 ENCOUNTER — Encounter (HOSPITAL_COMMUNITY)
Admission: EM | Disposition: A | Discharge status: Rehabilitation Facility | Payer: Self-pay | Source: Home / Self Care | Attending: Internal Medicine

## 2013-02-12 ENCOUNTER — Encounter (HOSPITAL_COMMUNITY): Payer: Self-pay | Admitting: Critical Care Medicine

## 2013-02-12 ENCOUNTER — Inpatient Hospital Stay (HOSPITAL_COMMUNITY): Payer: Medicare Other | Admitting: Critical Care Medicine

## 2013-02-12 DIAGNOSIS — C19 Malignant neoplasm of rectosigmoid junction: Secondary | ICD-10-CM

## 2013-02-12 HISTORY — PX: LAPAROTOMY: SHX154

## 2013-02-12 LAB — COMPREHENSIVE METABOLIC PANEL
ALT: 9 U/L (ref 0–53)
Albumin: 2.8 g/dL — ABNORMAL LOW (ref 3.5–5.2)
Alkaline Phosphatase: 81 U/L (ref 39–117)
Chloride: 106 mEq/L (ref 96–112)
Potassium: 4.1 mEq/L (ref 3.5–5.1)
Sodium: 141 mEq/L (ref 135–145)
Total Bilirubin: 0.5 mg/dL (ref 0.3–1.2)
Total Protein: 6.3 g/dL (ref 6.0–8.3)

## 2013-02-12 LAB — CBC
HCT: 25.8 % — ABNORMAL LOW (ref 39.0–52.0)
HCT: 29.5 % — ABNORMAL LOW (ref 39.0–52.0)
Hemoglobin: 8 g/dL — ABNORMAL LOW (ref 13.0–17.0)
MCH: 28.1 pg (ref 26.0–34.0)
MCHC: 31 g/dL (ref 30.0–36.0)
MCV: 90.5 fL (ref 78.0–100.0)
MCV: 90.2 fL (ref 78.0–100.0)
Platelets: 183 10*3/uL (ref 150–400)
RBC: 2.85 MIL/uL — ABNORMAL LOW (ref 4.22–5.81)
RBC: 3.27 MIL/uL — ABNORMAL LOW (ref 4.22–5.81)
RDW: 17.7 % — ABNORMAL HIGH (ref 11.5–15.5)
WBC: 6.9 10*3/uL (ref 4.0–10.5)
WBC: 9.4 10*3/uL (ref 4.0–10.5)

## 2013-02-12 SURGERY — LAPAROTOMY, EXPLORATORY
Anesthesia: General | Site: Abdomen

## 2013-02-12 MED ORDER — OXYCODONE HCL 5 MG/5ML PO SOLN
5.0000 mg | Freq: Once | ORAL | Status: DC | PRN
Start: 2013-02-12 — End: 2013-02-12

## 2013-02-12 MED ORDER — SUCCINYLCHOLINE CHLORIDE 20 MG/ML IJ SOLN
INTRAMUSCULAR | Status: DC | PRN
  Administered 2013-02-12: 100 mg via INTRAVENOUS

## 2013-02-12 MED ORDER — HYDROMORPHONE HCL PF 1 MG/ML IJ SOLN
0.2500 mg | INTRAMUSCULAR | Status: DC | PRN
  Administered 2013-02-12 (×2): 0.5 mg via INTRAVENOUS

## 2013-02-12 MED ORDER — HYDROMORPHONE 0.3 MG/ML IV SOLN
INTRAVENOUS | Status: AC
  Filled 2013-02-12: qty 25

## 2013-02-12 MED ORDER — DIPHENHYDRAMINE HCL 12.5 MG/5ML PO ELIX
12.5000 mg | ORAL_SOLUTION | Freq: Four times a day (QID) | ORAL | Status: DC | PRN
  Filled 2013-02-12: qty 5

## 2013-02-12 MED ORDER — PROPOFOL 10 MG/ML IV BOLUS
INTRAVENOUS | Status: DC | PRN
  Administered 2013-02-12: 140 mg via INTRAVENOUS

## 2013-02-12 MED ORDER — OXYCODONE HCL 5 MG PO TABS: 5.0000 mg | ORAL_TABLET | Freq: Once | ORAL | Status: DC | PRN

## 2013-02-12 MED ORDER — SODIUM CHLORIDE 0.9 % IJ SOLN: 9.0000 mL | INTRAMUSCULAR | Status: DC | PRN

## 2013-02-12 MED ORDER — LIDOCAINE HCL (CARDIAC) 20 MG/ML IV SOLN
INTRAVENOUS | Status: DC | PRN
  Administered 2013-02-12: 80 mg via INTRAVENOUS

## 2013-02-12 MED ORDER — POVIDONE-IODINE 10 % EX OINT
TOPICAL_OINTMENT | CUTANEOUS | Status: AC
  Filled 2013-02-12: qty 28.35

## 2013-02-12 MED ORDER — LACTATED RINGERS IV SOLN
INTRAVENOUS | Status: DC
  Administered 2013-02-12 – 2013-02-16 (×4): via INTRAVENOUS

## 2013-02-12 MED ORDER — NEOSTIGMINE METHYLSULFATE 1 MG/ML IJ SOLN
INTRAMUSCULAR | Status: DC | PRN
  Administered 2013-02-12: 1 mg via INTRAVENOUS
  Administered 2013-02-12: 4 mg via INTRAVENOUS

## 2013-02-12 MED ORDER — HYDROMORPHONE HCL PF 1 MG/ML IJ SOLN
INTRAMUSCULAR | Status: AC
  Administered 2013-02-12: 1 mg
  Filled 2013-02-12: qty 1

## 2013-02-12 MED ORDER — GLYCOPYRROLATE 0.2 MG/ML IJ SOLN
INTRAMUSCULAR | Status: DC | PRN
  Administered 2013-02-12: .8 mg via INTRAVENOUS
  Administered 2013-02-12: 0.2 mg via INTRAVENOUS

## 2013-02-12 MED ORDER — METOPROLOL TARTRATE 1 MG/ML IV SOLN
5.0000 mg | Freq: Four times a day (QID) | INTRAVENOUS | Status: DC
  Administered 2013-02-12 – 2013-02-24 (×44): 5 mg via INTRAVENOUS
  Filled 2013-02-12 (×59): qty 5

## 2013-02-12 MED ORDER — SODIUM CHLORIDE 0.9 % IV SOLN
INTRAVENOUS | Status: DC | PRN
  Administered 2013-02-12: 14:00:00 via INTRAVENOUS

## 2013-02-12 MED ORDER — ONDANSETRON HCL 4 MG/2ML IJ SOLN
INTRAMUSCULAR | Status: DC | PRN
  Administered 2013-02-12: 4 mg via INTRAVENOUS

## 2013-02-12 MED ORDER — ARTIFICIAL TEARS OP OINT
TOPICAL_OINTMENT | OPHTHALMIC | Status: DC | PRN
  Administered 2013-02-12: 1 via OPHTHALMIC

## 2013-02-12 MED ORDER — HYDROMORPHONE 0.3 MG/ML IV SOLN
INTRAVENOUS | Status: DC
  Administered 2013-02-12: 16:00:00 via INTRAVENOUS
  Administered 2013-02-13: 22 mL via INTRAVENOUS
  Administered 2013-02-13: 7 mL via INTRAVENOUS
  Administered 2013-02-13 – 2013-02-14 (×3): via INTRAVENOUS
  Administered 2013-02-14: 7 mL via INTRAVENOUS
  Administered 2013-02-14: 10 mL via INTRAVENOUS
  Administered 2013-02-14: 1.2 mg via INTRAVENOUS
  Administered 2013-02-14: 2.63 mg via INTRAVENOUS
  Administered 2013-02-14: 5.7 mg via INTRAVENOUS
  Administered 2013-02-15: 1.26 mg via INTRAVENOUS
  Administered 2013-02-15: 0.3 mg via INTRAVENOUS
  Filled 2013-02-12 (×3): qty 25

## 2013-02-12 MED ORDER — LACTATED RINGERS IV SOLN
INTRAVENOUS | Status: DC | PRN
  Administered 2013-02-12 (×2): via INTRAVENOUS

## 2013-02-12 MED ORDER — PANTOPRAZOLE SODIUM 40 MG IV SOLR
40.0000 mg | INTRAVENOUS | Status: DC
  Administered 2013-02-12 – 2013-02-20 (×9): 40 mg via INTRAVENOUS
  Filled 2013-02-12 (×12): qty 40

## 2013-02-12 MED ORDER — DIPHENHYDRAMINE HCL 50 MG/ML IJ SOLN
12.5000 mg | Freq: Four times a day (QID) | INTRAMUSCULAR | Status: DC | PRN
  Filled 2013-02-12: qty 0.25

## 2013-02-12 MED ORDER — ROCURONIUM BROMIDE 100 MG/10ML IV SOLN
INTRAVENOUS | Status: DC | PRN
  Administered 2013-02-12: 10 mg via INTRAVENOUS
  Administered 2013-02-12: 40 mg via INTRAVENOUS
  Administered 2013-02-12: 10 mg via INTRAVENOUS

## 2013-02-12 MED ORDER — POVIDONE-IODINE 10 % EX OINT
TOPICAL_OINTMENT | CUTANEOUS | Status: DC | PRN
  Administered 2013-02-12: 1 via TOPICAL

## 2013-02-12 MED ORDER — SODIUM CHLORIDE 0.9 % IR SOLN
Status: DC | PRN
  Administered 2013-02-12 (×2): 1000 mL

## 2013-02-12 MED ORDER — PHENYLEPHRINE HCL 10 MG/ML IJ SOLN
INTRAMUSCULAR | Status: DC | PRN
  Administered 2013-02-12 (×2): 80 ug via INTRAVENOUS
  Administered 2013-02-12: 120 ug via INTRAVENOUS

## 2013-02-12 MED ORDER — LACTATED RINGERS IV SOLN
INTRAVENOUS | Status: DC
  Administered 2013-02-12: 13:00:00 via INTRAVENOUS

## 2013-02-12 MED ORDER — FENTANYL CITRATE 0.05 MG/ML IJ SOLN
INTRAMUSCULAR | Status: DC | PRN
  Administered 2013-02-12 (×2): 100 ug via INTRAVENOUS

## 2013-02-12 MED ORDER — ONDANSETRON HCL 4 MG/2ML IJ SOLN
4.0000 mg | Freq: Four times a day (QID) | INTRAMUSCULAR | Status: DC | PRN
  Filled 2013-02-12: qty 2

## 2013-02-12 MED ORDER — MIDAZOLAM HCL 5 MG/5ML IJ SOLN
INTRAMUSCULAR | Status: DC | PRN
  Administered 2013-02-12: 2 mg via INTRAVENOUS

## 2013-02-12 MED ORDER — NALOXONE HCL 0.4 MG/ML IJ SOLN
0.4000 mg | INTRAMUSCULAR | Status: DC | PRN
  Filled 2013-02-12: qty 1

## 2013-02-12 MED ORDER — PROMETHAZINE HCL 25 MG/ML IJ SOLN: 6.2500 mg | INTRAMUSCULAR | Status: DC | PRN

## 2013-02-12 MED ORDER — ALBUMIN HUMAN 5 % IV SOLN
INTRAVENOUS | Status: DC | PRN
  Administered 2013-02-12: 14:00:00 via INTRAVENOUS

## 2013-02-12 MED ORDER — DEXTROSE 5 % IV SOLN
1.0000 g | Freq: Once | INTRAVENOUS | Status: AC
  Administered 2013-02-12: 1 g via INTRAVENOUS
  Filled 2013-02-12: qty 1

## 2013-02-12 SURGICAL SUPPLY — 49 items
BLADE SURG ROTATE 9660 (MISCELLANEOUS) ×2 IMPLANT
CANISTER SUCTION 2500CC (MISCELLANEOUS) ×2 IMPLANT
CHLORAPREP W/TINT 26ML (MISCELLANEOUS) ×2 IMPLANT
COVER MAYO STAND STRL (DRAPES) IMPLANT
COVER SURGICAL LIGHT HANDLE (MISCELLANEOUS) ×2 IMPLANT
DRAPE LAPAROSCOPIC ABDOMINAL (DRAPES) ×2 IMPLANT
DRAPE PROXIMA HALF (DRAPES) ×2 IMPLANT
DRAPE UTILITY 15X26 W/TAPE STR (DRAPE) ×4 IMPLANT
DRAPE WARM FLUID 44X44 (DRAPE) ×2 IMPLANT
DRSG OPSITE POSTOP 4X10 (GAUZE/BANDAGES/DRESSINGS) ×2 IMPLANT
DRSG OPSITE POSTOP 4X8 (GAUZE/BANDAGES/DRESSINGS) IMPLANT
ELECT BLADE 6.5 EXT (BLADE) ×2 IMPLANT
ELECT CAUTERY BLADE 6.4 (BLADE) ×2 IMPLANT
ELECT REM PT RETURN 9FT ADLT (ELECTROSURGICAL) ×2
ELECTRODE REM PT RTRN 9FT ADLT (ELECTROSURGICAL) ×1 IMPLANT
GLOVE BIO SURGEON STRL SZ7.5 (GLOVE) ×2 IMPLANT
GLOVE BIO SURGEON STRL SZ8 (GLOVE) ×2 IMPLANT
GLOVE BIOGEL PI IND STRL 7.5 (GLOVE) ×1 IMPLANT
GLOVE BIOGEL PI IND STRL 8 (GLOVE) ×3 IMPLANT
GLOVE BIOGEL PI INDICATOR 7.5 (GLOVE) ×1
GLOVE BIOGEL PI INDICATOR 8 (GLOVE) ×3
GLOVE ECLIPSE 7.5 STRL STRAW (GLOVE) ×2 IMPLANT
GOWN STRL NON-REIN LRG LVL3 (GOWN DISPOSABLE) ×4 IMPLANT
GOWN STRL REIN XL XLG (GOWN DISPOSABLE) ×2 IMPLANT
KIT BASIN OR (CUSTOM PROCEDURE TRAY) ×2 IMPLANT
KIT OSTOMY DRAINABLE 2.75 STR (WOUND CARE) ×2 IMPLANT
KIT ROOM TURNOVER OR (KITS) ×2 IMPLANT
LIGASURE IMPACT 36 18CM CVD LR (INSTRUMENTS) ×2 IMPLANT
NS IRRIG 1000ML POUR BTL (IV SOLUTION) ×4 IMPLANT
PACK GENERAL/GYN (CUSTOM PROCEDURE TRAY) ×2 IMPLANT
PAD ARMBOARD 7.5X6 YLW CONV (MISCELLANEOUS) ×2 IMPLANT
PENCIL BUTTON HOLSTER BLD 10FT (ELECTRODE) IMPLANT
RELOAD PROXIMATE 75MM BLUE (ENDOMECHANICALS) ×2 IMPLANT
SPECIMEN JAR LARGE (MISCELLANEOUS) ×2 IMPLANT
SPONGE LAP 18X18 X RAY DECT (DISPOSABLE) IMPLANT
STAPLER PROXIMATE 75MM BLUE (STAPLE) ×2 IMPLANT
STAPLER VISISTAT 35W (STAPLE) ×2 IMPLANT
SUCTION POOLE TIP (SUCTIONS) ×2 IMPLANT
SUT PDS AB 1 TP1 96 (SUTURE) ×4 IMPLANT
SUT PROLENE 2 0 SH 30 (SUTURE) ×2 IMPLANT
SUT SILK 2 0 SH CR/8 (SUTURE) ×2 IMPLANT
SUT SILK 2 0 TIES 10X30 (SUTURE) ×2 IMPLANT
SUT SILK 3 0 SH CR/8 (SUTURE) ×2 IMPLANT
SUT SILK 3 0 TIES 10X30 (SUTURE) ×2 IMPLANT
SUT VIC AB 3-0 SH 18 (SUTURE) ×2 IMPLANT
TOWEL OR 17X26 10 PK STRL BLUE (TOWEL DISPOSABLE) ×2 IMPLANT
TRAY FOLEY CATH 16FRSI W/METER (SET/KITS/TRAYS/PACK) ×2 IMPLANT
TUBE CONNECTING 12X1/4 (SUCTIONS) IMPLANT
YANKAUER SUCT BULB TIP NO VENT (SUCTIONS) ×2 IMPLANT

## 2013-02-12 NOTE — Progress Notes (Signed)
Patient ID: Larry [name], male   DOB: 20-Apr-1950, 62 y.o.   MRN: [id number] Subjective:  No chest pain or sob.   Objective:  Vital Signs in the last 24 hours: Temp:  [98 F (36.7 C)-98.6 F (37 C)] 98.6 F (37 C) (12/23 0457) Pulse Rate:  [82-110] 110 (12/23 0457) Resp:  [17-20] 17 (12/23 0457) BP: (100-116)/(50-67) 100/58 mmHg (12/23 0457) SpO2:  [98 %-100 %] 98 % (12/23 0457) Weight:  [294 lb 3.2 oz (133.448 kg)] 294 lb 3.2 oz (133.448 kg) (12/22 2102)  Intake/Output from previous day: 12/22 0701 - 12/23 0700 In: 960 [P.O.:960] Out: 1725 [Urine:1725] Intake/Output from this shift:    Physical Exam: Well appearing 62 yo man,NAD HEENT: Unremarkable Neck:  Supple Lungs:  CTA HEART:  Regular rate rhythm Abd:  soft, positive bowel sounds Ext:  trace edema Skin:  No rashes no nodules Neuro:  CN II through XII intact, motor grossly intact  Lab Results:  Recent Labs  02/11/13 0621 02/12/13 0430  WBC 6.8 6.9  HGB 7.8* 8.0*  PLT 183 196    Recent Labs  02/11/13 0621 02/12/13 0430  NA 137 141  K 4.0 4.1  CL 106 106  CO2 22 24  GLUCOSE 87 92  BUN 8 8  CREATININE 1.48* 1.43*   Hepatic Function Panel  Recent Labs  02/12/13 0430  PROT 6.3  ALBUMIN 2.8*  AST 6  ALT 9  ALKPHOS 81  BILITOT 0.5    Cardiac Studies: Tele - nsr with PAT Assessment/Plan:  1. Likely colon CA, awaiting surgery.  2. CAD with LV dyfunction -echo shows mildly reduced EF which is unchanged; would proceed with surgery. There is mild increased risk given H/O CAD.   3. SVT-PAT noted on telemetry; given abdominal surgery, will change lopressor to IV and DC cardizem; watch volume status postop as his lasix is on hold.   LOS: 10 days    Morning Halberg,M.D. 02/12/2013, 7:40 AM

## 2013-02-12 NOTE — Transfer of Care (Signed)
Immediate Anesthesia Transfer of Care Note  Patient: Larry [name]  Procedure(s) Performed: Procedure(s): EXPLORATORY LAPAROTOMY PARTIAL COLECTOMY WITH COLOSTOMY (N/A)  Patient Location: PACU  Anesthesia Type:General  Level of Consciousness: sedated  Airway & Oxygen Therapy: Patient Spontanous Breathing and Patient connected to face mask oxygen  Post-op Assessment: Report given to PACU RN and Post -op Vital signs reviewed and stable  Post vital signs: stable  Complications: No apparent anesthesia complications

## 2013-02-12 NOTE — Progress Notes (Signed)
Report given to Larhonda Dettloff rn as caregiver 

## 2013-02-12 NOTE — Anesthesia Procedure Notes (Signed)
Procedure Name: Intubation Date/Time: 02/12/2013 1:26 PM Performed by: Elon Alas Pre-anesthesia Checklist: Patient identified, Timeout performed, Emergency Drugs available, Suction available and Patient being monitored Patient Re-evaluated:Patient Re-evaluated prior to inductionOxygen Delivery Method: Circle system utilized Preoxygenation: Pre-oxygenation with 100% oxygen Intubation Type: IV induction, Rapid sequence and Cricoid Pressure applied Ventilation: Mask ventilation with difficulty, Two handed mask ventilation required and Oral airway inserted - appropriate to patient size Laryngoscope Size: Mac, 4, Miller and 2 Grade View: Grade III Tube type: Oral Tube size: 8.0 mm Number of attempts: 3 Airway Equipment and Method: Stylet and Video-laryngoscopy Placement Confirmation: ETT inserted through vocal cords under direct vision,  positive ETCO2 and breath sounds checked- equal and bilateral Tube secured with: Tape Dental Injury: Teeth and Oropharynx as per pre-operative assessment  Comments: DLx1 by CRNA with [facility] - grade 4 view, DLx1 by Dr. Krista Blue with [facility] 2 - grade 3 view - large epiglottis, DLX1 with glidescope - grade 1 view - ETT easily inserted.

## 2013-02-12 NOTE — Anesthesia Postprocedure Evaluation (Signed)
Anesthesia Post Note  Patient: Larry [name]  Procedure(s) Performed: Procedure(s) (LRB): EXPLORATORY LAPAROTOMY PARTIAL COLECTOMY WITH COLOSTOMY (N/A)  Anesthesia type: general  Patient location: PACU  Post pain: Pain level controlled  Post assessment: Patient's Cardiovascular Status Stable  Last Vitals:  Filed Vitals:   02/12/13 1616  BP: 117/83  Pulse: 98  Temp:   Resp: 16    Post vital signs: Reviewed and stable  Level of consciousness: sedated  Complications: No apparent anesthesia complications

## 2013-02-12 NOTE — Progress Notes (Signed)
[facility]                                                                                Patient Demographics  Larry [name], is a 62 y.o. male, DOB - April 09, 1950, ZOX:[id number]  Admit date - 02/02/2013   Admitting Physician Hillary Bow, DO  Outpatient Primary MD for the patient is No primary provider on file.  LOS - 10   Chief Complaint  Patient presents with  . Weakness  . Near Syncope      Interim history This is a 62 year old male who presents to the emergency room for generalized weakness for approximately 4 weeks. Patient was also noted to have a chronic anemia. Patient was found to have acute renal failure on admission which has since improved. Patient with cirrhosis he Dr. On 12/26/2012 however this was canceled as patient was not feeling well. GI was consulted in the hospital. Colonoscopy did be placed showing a colonic mass, followed by CT scan which did show a mass with possible liver metastasis. Surgery has also been consulted, patient is to undergo surgery 02/12/2013.  Assessment & Plan   Principal Problem:   Acute renal failure Active Problems:   HYPERLIPIDEMIA   OBESITY   HYPERTENSION   CAD   CARDIOMYOPATHY, ISCHEMIC   ATRIAL TACHYCARDIA   C O P D   SLEEP APNEA, OBSTRUCTIVE   Hypotension   Chronic blood loss anemia   Atrial fibrillation   Colonic mass  Severe acute renal failure  -Improving, creatinine 1.43 -Renal ultrasound showed no hydronephrosis -May be secondary to medications versus dehydration  Atrial Tachycardia  -rates have been up to 130's at rest, currently controlled  -Continue Coreg and Cardizem  Hypotension  -Likely secondary to hypovolemia  -Resolved   Normocytic anemia  -H/H 8/25.8, stable, however patient has had dark stools and has had POCT positive -Multifactorial: GI related vs renal disease -Iron studies show Iron def anemia -Patient has received 4units PRBCs this admission -EGD showed antral erosions and  duodenitis. -Colonoscopy showed obstructing colon mass, transverse colon. Diverticula, internal and external hemorrhoids  Colonic mass -Found on colonoscopy -CEA 5.2 -CT abd scan showed Circumferential mass involving the mid transverse colon is identified compatible with colon carcinoma. Severallow-attenuation foci are identified within the liver parenchyma.  -CT chest: Subpleural nodule in the right upper lobe measures 5 mm. -Surgery consulted and colectomy and colostomy planned for today.  B12 and folate deficiency  -B12 level Oct 2014 was quit low at 251 - repeated this admit 393 but given anemia, received loading dose -Folate low so was given IV and now continues po   Hyperlipidemia -treatment being held  Hx of diastolic CHF as well as ischemic CM/systolic CHF  -EF 45-50% via cath 2013  -Cardiology consulted  COPD  -Well compensated at present and not requiring any medication -Has Xopenex when necessary  Hx of CAD s/p PTCA w/ stents x 2 2010  -Asymptomatic (see above)   Obesity - Body mass index is 41.46 kg/(m^2).  Nausea -Will add Zofran when necessary  Code Status: Full  Family Communication: None at this time. Attempted to contact wife.  Disposition Plan: Admitted.   Procedures  -  EGD showed antral erosions and duodenitis. -Colonoscopy showed obstructing colon mass, transverse colon.  Diverticula, internal and external hemorrhoids  -Echocardiogram 12/21 Study Conclusions - Left ventricle: The cavity size was mildly dilated. Wall thickness was increased in a pattern of mild LVH. Systolic function was mildly reduced. The estimated ejection fraction was in the range of 45% to 50%. Doppler parameters are consistent with abnormal left ventricular relaxation (grade 1 diastolic dysfunction).  - Atrial septum: No defect or patent foramen ovale was identified. - Impressions: This study was technically challenging.  Endocardium is not well visualized. The left  ventricular ejection fraction is low normal. There is highly echogenic echodensity in the left ventricular apex. While with no regional wall motion abnormalities in the apex a thrombus is not common, a repeated echo with echocontrast is recommended.  Echocardiogram 12/22 Impressions: - Limited echo with definity shows normal LV systolic function with estimated EFof 50%. Regional wall motion appears normal. No left apical thrombus is seen.  Colectomy and colostomy 02/12/2013  Consults   Gastroenterology Cardiology Surgery  DVT Prophylaxis  SCDs  Lab Results  Component Value Date   PLT 196 02/12/2013    Medications  Scheduled Meds: . cefoTEtan (CEFOTAN) IV  2 g Intravenous On Call to OR  . docusate sodium  100 mg Oral BID  . feeding supplement (ENSURE COMPLETE)  237 mL Oral TID BM  . folic acid  1 mg Oral Daily  . metoprolol  5 mg Intravenous Q6H  . sodium chloride  3 mL Intravenous Q12H   Continuous Infusions:   PRN Meds:.levalbuterol, ondansetron (ZOFRAN) IV  Antibiotics    Anti-infectives   Start     Dose/Rate Route Frequency Ordered Stop   02/12/13 0600  cefoTEtan (CEFOTAN) 2 g in dextrose 5 % 50 mL IVPB     2 g 100 mL/hr over 30 Minutes Intravenous On call to O.R. 02/11/13 1706 02/13/13 0559     Time Spent in minutes   25 minutes   Agnes Brightbill D.O. on 02/12/2013 at 11:40 AM  Between 7am to 7pm - Pager - [phone]  After 7pm go to [email or web address] - password [email or web address]  And look for the night coverage person covering for me after hours  [facility] Office  [phone]    Subjective:   Larry [name] seen and examined today.  Patient has no complaints.  He states yesterday for his upcoming surgery. He denies any chest pain or shortness of breath, dizziness.   Objective:   Filed Vitals:   02/11/13 1716 02/11/13 2102 02/12/13 0457 02/12/13 0946  BP: 103/50 116/61 100/58 111/75  Pulse: 109 84 110 89  Temp: 98.6 F (37 C) 98.5 F (36.9  C) 98.6 F (37 C) 99.1 F (37.3 C)  TempSrc: Oral Oral Oral Oral  Resp: 20 18 17 18   Height:      Weight:  133.448 kg (294 lb 3.2 oz)    SpO2: 100% 98% 98% 98%    Wt Readings from Last 3 Encounters:  02/11/13 133.448 kg (294 lb 3.2 oz)  02/11/13 133.448 kg (294 lb 3.2 oz)  02/11/13 133.448 kg (294 lb 3.2 oz)     Intake/Output Summary (Last 24 hours) at 02/12/13 1140 Last data filed at 02/12/13 0900  Gross per 24 hour  Intake    720 ml  Output   1300 ml  Net   -580 ml    Exam  General: Well developed, well nourished, NAD, appears stated age  HEENT: NCAT, PERRLA, EOMI,  mucous membranes moist.   Neck: Supple, no JVD, no masses  Cardiovascular: S1 S2 auscultated, no rubs, murmurs or gallops. Regular rate and rhythm.  Respiratory: Clear to auscultation bilaterally with equal chest rise  Abdomen: Soft, obese, nontender, nondistended, + bowel sounds  Extremities: warm dry without cyanosis clubbing or edema  Neuro: AAOx3, cranial nerves grossly intact.   Skin: Without rashes exudates or nodules  Psych: Normal affect and demeanor with intact judgement and insight  Data Review   Micro Results Recent Results (from the past 240 hour(s))  MRSA PCR SCREENING     Status: None   Collection Time    02/02/13 10:35 PM      Result Value Range Status   MRSA by PCR NEGATIVE  NEGATIVE Final   Comment:            The GeneXpert MRSA Assay (FDA     approved for NASAL specimens     only), is one component of a     comprehensive MRSA colonization     surveillance program. It is not     intended to diagnose MRSA     infection nor to guide or     monitor treatment for     MRSA infections.    Radiology Reports US Renal  02/02/2013   CLINICAL DATA:  Renal failure  EXAM: RENAL/URINARY TRACT ULTRASOUND COMPLETE  COMPARISON:  None.  FINDINGS: Right Kidney:  Length: 11.6 cm. Echogenic renal parenchyma, suggesting medical renal disease. 1.4 x 1.1 x 1.2 cm upper pole cyst. No  hydronephrosis.  Left Kidney:  Length: 11.7 cm. Echogenic renal parenchyma, suggesting medical renal disease. 2.1 x 2.0 x 2.0 cm upper pole cyst. No hydronephrosis.  Bladder:  Decompressed by indwelling Foley catheter.  IMPRESSION: Echogenic renal parenchyma, suggesting medical renal disease.  Bilateral renal cysts.  No hydronephrosis.   Electronically Signed   By: Charline Bills M.D.   On: 02/02/2013 20:59   Dg Chest Portable 1 View  02/02/2013   CLINICAL DATA:  Weakness, presyncope  EXAM: PORTABLE CHEST - 1 VIEW  COMPARISON:  06/16/2011  FINDINGS: Mildly low lung volumes. There is mild elevation the right hemidiaphragm. Cardiac silhouette is within normal limits. There is no evidence of focal infiltrates, effusions, nor edema. Dextroscoliosis identified within the thoracic spine.  IMPRESSION: No evidence of acute cardiopulmonary disease.   Electronically Signed   By: Salome Holmes M.D.   On: 02/02/2013 17:34    CBC  Recent Labs Lab 02/07/13 0532 02/08/13 0540 02/09/13 0540 02/10/13 0635 02/11/13 0621 02/12/13 0430  WBC 8.1 7.8 7.0  --  6.8 6.9  HGB 8.0* 8.7* 8.0* 7.9* 7.8* 8.0*  HCT 25.8* 28.3* 26.0* 26.3* 25.2* 25.8*  PLT 167 167 189  --  183 196  MCV 90.2 91.6 91.2  --  90.6 90.5  MCH 28.0 28.2 28.1  --  28.1 28.1  MCHC 31.0 30.7 30.8  --  31.0 31.0  RDW 18.8* 18.9* 18.5*  --  18.2* 18.2*    Chemistries   Recent Labs Lab 02/08/13 0540 02/09/13 0540 02/10/13 0635 02/11/13 0621 02/12/13 0430  NA 141 137 139 137 141  K 4.7 4.0 4.0 4.0 4.1  CL 112 107 107 106 106  CO2 19 21 21 22 24   GLUCOSE 82 88 91 87 92  BUN 16 12 10 8 8   CREATININE 1.75* 1.67* 1.55* 1.48* 1.43*  CALCIUM 9.2 9.2 9.3 9.3 9.4  AST  --  9  --   --  6  ALT  --  14  --   --  9  ALKPHOS  --  79  --   --  81  BILITOT  --  0.8  --   --  0.5   ------------------------------------------------------------------------------------------------------------------ estimated creatinine clearance is 77 ml/min  (by C-G formula based on Cr of 1.43). ------------------------------------------------------------------------------------------------------------------ No results found for this basename: HGBA1C,  in the last 72 hours ------------------------------------------------------------------------------------------------------------------ No results found for this basename: CHOL, HDL, LDLCALC, TRIG, CHOLHDL, LDLDIRECT,  in the last 72 hours ------------------------------------------------------------------------------------------------------------------ No results found for this basename: TSH, T4TOTAL, FREET3, T3FREE, THYROIDAB,  in the last 72 hours ------------------------------------------------------------------------------------------------------------------ No results found for this basename: VITAMINB12, FOLATE, FERRITIN, TIBC, IRON, RETICCTPCT,  in the last 72 hours  Coagulation profile  Recent Labs Lab 02/12/13 0430  INR 1.08    No results found for this basename: DDIMER,  in the last 72 hours  Cardiac Enzymes No results found for this basename: CK, CKMB, TROPONINI, MYOGLOBIN,  in the last 168 hours ------------------------------------------------------------------------------------------------------------------ No components found with this basename: POCBNP,

## 2013-02-12 NOTE — Anesthesia Preprocedure Evaluation (Addendum)
Anesthesia Evaluation  Patient identified by MRN, date of birth, ID band Patient awake    Reviewed: Allergy & Precautions, H&P , NPO status , Patient's Chart, lab work & pertinent test results  Airway Mallampati: II TM Distance: >3 FB Neck ROM: Full    Dental  (+) Teeth Intact and Dental Advisory Given   Pulmonary asthma , sleep apnea , COPDformer smoker,    Pulmonary exam normal       Cardiovascular hypertension, Pt. on medications + CAD and +CHF + dysrhythmias     Neuro/Psych    GI/Hepatic   Endo/Other    Renal/GU Renal InsufficiencyRenal disease     Musculoskeletal   Abdominal   Peds  Hematology  (+) Blood dyscrasia, anemia ,   Anesthesia Other Findings   Reproductive/Obstetrics                         Anesthesia Physical Anesthesia Plan  ASA: III  Anesthesia Plan: General   Post-op Pain Management:    Induction: Intravenous  Airway Management Planned: Oral ETT  Additional Equipment:   Intra-op Plan:   Post-operative Plan: Extubation in OR  Informed Consent: I have reviewed the patients History and Physical, chart, labs and discussed the procedure including the risks, benefits and alternatives for the proposed anesthesia with the patient or authorized representative who has indicated his/her understanding and acceptance.   Dental advisory given  Plan Discussed with: Anesthesiologist, Surgeon and CRNA  Anesthesia Plan Comments:        Anesthesia Quick Evaluation

## 2013-02-12 NOTE — Progress Notes (Signed)
4 Days Post-Op  Subjective: No real change, for surgery later today and waiting.  Objective: Vital signs in last 24 hours: Temp:  [98 F (36.7 C)-98.6 F (37 C)] 98.6 F (37 C) (12/23 0457) Pulse Rate:  [82-110] 110 (12/23 0457) Resp:  [17-20] 17 (12/23 0457) BP: (100-116)/(50-67) 100/58 mmHg (12/23 0457) SpO2:  [98 %-100 %] 98 % (12/23 0457) Weight:  [133.448 kg (294 lb 3.2 oz)] 133.448 kg (294 lb 3.2 oz) (12/22 2102) Last BM Date: 02/08/13 Afebrile, VSS Creatinine is stable coags OK, type and screen completed. Intake/Output from previous day: 12/22 0701 - 12/23 0700 In: 960 [P.O.:960] Out: 1725 [Urine:1725] Intake/Output this shift:    General appearance: alert, cooperative and no distress Resp: clear to auscultation bilaterally GI: soft, large,nonteder, abdomen  Lab Results:   Recent Labs  02/11/13 0621 02/12/13 0430  WBC 6.8 6.9  HGB 7.8* 8.0*  HCT 25.2* 25.8*  PLT 183 196    BMET  Recent Labs  02/11/13 0621 02/12/13 0430  NA 137 141  K 4.0 4.1  CL 106 106  CO2 22 24  GLUCOSE 87 92  BUN 8 8  CREATININE 1.48* 1.43*  CALCIUM 9.3 9.4   PT/INR  Recent Labs  02/12/13 0430  LABPROT 13.8  INR 1.08     Recent Labs Lab 02/09/13 0540 02/10/13 0635 02/12/13 0430  AST 9  --  6  ALT 14  --  9  ALKPHOS 79  --  81  BILITOT 0.8  --  0.5  PROT 6.6  --  6.3  ALBUMIN 2.9* 2.8* 2.8*     Lipase     Component Value Date/Time   LIPASE 15 11/05/2008 1215     Studies/Results: No results found.  Medications: . cefoTEtan (CEFOTAN) IV  2 g Intravenous On Call to OR  . docusate sodium  100 mg Oral BID  . enoxaparin (LOVENOX) injection  40 mg Subcutaneous Q24H  . feeding supplement (ENSURE COMPLETE)  237 mL Oral TID BM  . folic acid  1 mg Oral Daily  . metoprolol  5 mg Intravenous Q6H  . sodium chloride  3 mL Intravenous Q12H    Assessment/Plan 1. Obstructing colon mass. (No BM for 2 weeks) Path: 1. Stomach, biopsy - MILD CHRONIC INACTIVE  GASTRITIS (ANTRAL MUCOSA) - WARTHIN STARRY STAIN NEGATIVE FOR H. PYLORI 2. Colon, biopsy, transverse mass - INVASIVE ADENOCARCINOMA, SEE COMMENT. 2. Acute renal failure  3. Anemia with GI blood loss.  4. CAD with CM; EF 345-50% 05/2011.  5. Hx of 2 RCA stents 2010./hx of atrial tachycardia/NSVT on BB and CCB's.  6. COPD/tobacco use Hx.  7. Hx of ETOH heavy use 20+ years, quit 2010.  8. Obesity Body mass index is 41.6.  9. Dyslipidemia  10. Deconditioning/malnutrition, he has been inactive for 5 weeks and not eating.    Plan:  Surgery today, I have stopped AM Lovenox, he has SCD's in place.  LOS: 10 days    Kalana Yust 02/12/2013

## 2013-02-12 NOTE — Progress Notes (Signed)
To the OR today for colectomy and colostomy.  Marta Lamas. Gae Bon, MD, FACS [phone] [facility]

## 2013-02-12 NOTE — Progress Notes (Signed)
For the OR today.  Larry [name]. Gae Bon, MD, FACS [phone] [id number] [facility]

## 2013-02-12 NOTE — Op Note (Signed)
OPERATIVE REPORT  DATE OF OPERATION: 02/02/2013 - 02/12/2013  PATIENT:  Larry [name]  62 y.o. male  PRE-OPERATIVE DIAGNOSIS:  COLONIC MASS  POST-OPERATIVE DIAGNOSIS:  COLONIC MASS  PROCEDURE:  Procedure(s): EXPLORATORY LAPAROTOMY PARTIAL COLECTOMY WITH COLOSTOMY (Transverse colectomy with colostomy and long Hartman's pouch)  SURGEON:  Surgeon(s): Cherylynn Ridges, MD Liz Malady, MD  ASSISTANT: Janee Morn  ANESTHESIA:   general  EBL: <100 ml  BLOOD ADMINISTERED: 350 CC PRBC  DRAINS: Nasogastric Tube and Urinary Catheter (Foley)   SPECIMEN:  Source of Specimen:  Transverse colon with tumor  Distal margin marked with suture.  COUNTS CORRECT:  YES  PROCEDURE DETAILS: The patient was taken to the operating room and placed on the table in the supine position. After an adequate general endotracheal anesthetic was administered he was prepped and draped in usual sterile manner exposing his entire abdomen.  Had been marked preoperatively for a right transverse colon or right descending colon colostomy. A proper time out was performed identifying the patient and the procedure to be performed. We then made a midline incision from just below the xiphoid process to just above the umbilicus. We taken down to and through the midline fascia using electrocautery. We entered the peritoneal cavity using electrocautery.  We inspected the peritoneal cavity visually and also by palpation. There was no evidence of any significant hepatic lesions. The small bowel was run from the ligament of Treitz down to the terminal ileum. There were some areas where the tumor had contracted the mesentery of the small bowel towards the tumor mass. These represent adhesions which could be taken down easily and no malignant fusion.  The tumor was in the mid transverse colon and was easily palpated. It was also marked with ink. We mobilized the right transverse colon distal to the hepatic flexure. We came across  a right transverse colon through the mesentery across the bowel wall with a GIA-75 stapler. The mesentery was taken down using a LigaSure device and also Kelly clamps and 2-0 silk ties.  We mobilized towards the tumor and actually had to dissect away some of the omentum from the stomach wall and take it with the specimen. This is done primarily with a LigaSure device. We entered the lesser sac by coming across the omentum we came across the distal transverse colon using a GIA-75 stapler. The mesentery between the proximal and distal end of this are taken with a LigaSure device and also Kelly clamps and 2-0 silk ties. Once all the mesentery portions were taken the specimen was freed and the distal end was marked with a 2-0 silk.  The distal Hartman's pouch remaining in the patient's peritoneal cavity was marked with a 2-0 Prolene suture. The proximal transverse colon leading to the ascending colon was mobilized in order to bring it out as colostomy in the right upper quadrant. A #10 blade was used to make the colostomy skin incision and then subsequently dissected out the subcutaneous tissue using electrocautery. We came across the rectus fascia and split the muscle. We brought the proximal right transverse colon out through the colostomy site and subsequently matured after the abdomen was closed using interrupted 3-0 Vicryl sutures.  The stoma appeared to be completely viable.  Prior to maturing the colostomy the abdomen was closed using running looped #1 PDS suture. The skin was closed using stainless steel staples. A sterile dressing was applied. All needle counts, sponge counts, and instrument counts were correct.  PATIENT DISPOSITION:  PACU -  hemodynamically stable.   Cherylynn Ridges 12/23/20143:15 PM

## 2013-02-12 NOTE — Preoperative (Signed)
Beta Blockers   Reason not to administer Beta Blockers:Not Applicable 

## 2013-02-13 ENCOUNTER — Inpatient Hospital Stay (HOSPITAL_COMMUNITY): Payer: Medicare Other

## 2013-02-13 ENCOUNTER — Encounter (HOSPITAL_COMMUNITY): Payer: Self-pay | Admitting: General Surgery

## 2013-02-13 LAB — BASIC METABOLIC PANEL
CO2: 25 mEq/L (ref 19–32)
Calcium: 8.9 mg/dL (ref 8.4–10.5)
Chloride: 106 mEq/L (ref 96–112)
Creatinine, Ser: 1.45 mg/dL — ABNORMAL HIGH (ref 0.50–1.35)
GFR calc non Af Amer: 51 mL/min — ABNORMAL LOW (ref >90)
Sodium: 140 mEq/L (ref 135–145)

## 2013-02-13 LAB — CBC
MCH: 27.8 pg (ref 26.0–34.0)
MCHC: 30.7 g/dL (ref 30.0–36.0)
MCV: 90.7 fL (ref 78.0–100.0)
Platelets: 192 10*3/uL (ref 150–400)
RBC: 3.34 MIL/uL — ABNORMAL LOW (ref 4.22–5.81)
RDW: 17.8 % — ABNORMAL HIGH (ref 11.5–15.5)

## 2013-02-13 MED ORDER — GADOXETATE DISODIUM 0.25 MMOL/ML IV SOLN
5.0000 mL | Freq: Once | INTRAVENOUS | Status: AC | PRN
  Administered 2013-02-13: 13 mL via INTRAVENOUS

## 2013-02-13 NOTE — Progress Notes (Signed)
Patient ID: Larry [name], male   DOB: 1950/03/30, 62 y.o.   MRN: [id number] Subjective:  No chest pain or sob. Complains of abdominal pain.  Objective:  Vital Signs in the last 24 hours: Temp:  [97.5 F (36.4 C)-99.7 F (37.6 C)] 99.7 F (37.6 C) (12/24 0408) Pulse Rate:  [42-104] 103 (12/24 0408) Resp:  [9-20] 15 (12/24 0408) BP: (111-146)/(65-97) 117/75 mmHg (12/24 0408) SpO2:  [86 %-100 %] 99 % (12/24 0408) Weight:  [298 lb (135.172 kg)] 298 lb (135.172 kg) (12/23 2044)  Intake/Output from previous day: 12/23 0701 - 12/24 0700 In: 2125 [I.V.:1550; Blood:325; IV Piggyback:250] Out: 1595 [Urine:1525; Emesis/NG output:20; Blood:50] Intake/Output from this shift:    Physical Exam: Well appearing 62 yo man,NAD HEENT: Unremarkable Neck:  Supple Lungs:  CTA anteriorly HEART:  Regular rate rhythm Abd:  S/p abdominal surgery Ext:  No edema Skin:  No rashes no nodules Neuro:  CN II through XII intact, motor grossly intact  Lab Results:  Recent Labs  02/12/13 1958 02/13/13 0359  WBC 9.4 8.4  HGB 9.4* 9.3*  PLT 183 192    Recent Labs  02/12/13 0430 02/13/13 0359  NA 141 140  K 4.1 4.3  CL 106 106  CO2 24 25  GLUCOSE 92 115*  BUN 8 9  CREATININE 1.43* 1.45*   Hepatic Function Panel  Recent Labs  02/12/13 0430  PROT 6.3  ALBUMIN 2.8*  AST 6  ALT 9  ALKPHOS 81  BILITOT 0.5    Cardiac Studies: Tele - nsr  Assessment/Plan:  1. S/P abdominal surgery-management per general surgery. 2. CAD with LV dyfunction -resume ASA and statin when ok with surgery. 3. SVT-Pt remains in sinus; continue IV lopressor and convert to po when able; watch volume status postop as his lasix is on hold.   LOS: 11 days    Brian Crenshaw,M.D. 02/13/2013, 7:18 AM

## 2013-02-13 NOTE — Consult Note (Signed)
WOC ostomy consult note Stoma type/location:  RUQ, end colostomy Stomal assessment/size: aprox. 1 3/4" round, only slightly budded, pink, moist, but he does have some darkness at the stomal edges Peristomal assessment: pouch intact from OR yesterday Output none Ostomy pouching: 2pc. In place currently, met with wife and patient preoperatively and we discussed 2pc if use if possible.   Will order 2pc 2 3/4" and 2" barrier rings as the stoma is only slightly budded. Educational booklet in the room.   Will enroll in [employer] start today, however with holiday do not anticipate shipment of samples from [place] until next week. Wife is not in the room at the time, pt on PCA but seems very clear this am.  Explained that WOC team will follow up later in the week to begin to work with him and his wife on ostomy care.  WOC team will follow along with you for ostomy support and teaching [facility] RN,CWOCN [phone]

## 2013-02-13 NOTE — Progress Notes (Signed)
Larry [name] notified pt going to MRI

## 2013-02-13 NOTE — Progress Notes (Signed)
Foley catheter d/cd at 09:15 am. Will continue to monitor.

## 2013-02-13 NOTE — Progress Notes (Signed)
PT Cancellation Note  Patient Details Name: Larry [name] MRN: [id number] DOB: [birth year]   Cancelled Treatment:    Reason Eval/Treat Not Completed: Pain limiting ability to participate.  Patient reports pain is too high.  Politely refused PT today.  Will return at later date.   Vena Austria 02/13/2013, 10:51 AM Durenda Hurt. Renaldo Fiddler, [facility] Pager [phone]

## 2013-02-13 NOTE — Progress Notes (Signed)
1 Day Post-Op  Subjective: Pt sore  Objective: Vital signs in last 24 hours: Temp:  [97.5 F (36.4 C)-99.7 F (37.6 C)] 99.7 F (37.6 C) (12/24 0408) Pulse Rate:  [42-104] 103 (12/24 0408) Resp:  [9-20] 15 (12/24 0408) BP: (111-146)/(65-97) 117/75 mmHg (12/24 0408) SpO2:  [86 %-100 %] 99 % (12/24 0408) Weight:  [298 lb (135.172 kg)] 298 lb (135.172 kg) (12/23 2044) Last BM Date: 02/08/13  Intake/Output from previous day: 12/23 0701 - 12/24 0700 In: 2125 [I.V.:1550; Blood:325; IV Piggyback:250] Out: 1595 [Urine:1525; Emesis/NG output:20; Blood:50] Intake/Output this shift:    Incision/Wound:incision intact.  Ostomy little dusky but viable.  Distended. Appropriately tender  Lab Results:   Recent Labs  02/12/13 1958 02/13/13 0359  WBC 9.4 8.4  HGB 9.4* 9.3*  HCT 29.5* 30.3*  PLT 183 192   BMET  Recent Labs  02/12/13 0430 02/13/13 0359  NA 141 140  K 4.1 4.3  CL 106 106  CO2 24 25  GLUCOSE 92 115*  BUN 8 9  CREATININE 1.43* 1.45*  CALCIUM 9.4 8.9   PT/INR  Recent Labs  02/12/13 0430  LABPROT 13.8  INR 1.08   ABG No results found for this basename: PHART, PCO2, PO2, HCO3,  in the last 72 hours  Studies/Results: No results found.  Anti-infectives: Anti-infectives   Start     Dose/Rate Route Frequency Ordered Stop   02/12/13 2330  cefoTEtan (CEFOTAN) 1 g in dextrose 5 % 50 mL IVPB     1 g 100 mL/hr over 30 Minutes Intravenous  Once 02/12/13 1717 02/13/13 0024   02/12/13 0600  [MAR Hold]  cefoTEtan (CEFOTAN) 2 g in dextrose 5 % 50 mL IVPB     (On MAR Hold since 02/12/13 1206)   2 g 100 mL/hr over 30 Minutes Intravenous On call to O.R. 02/11/13 1706 02/12/13 1216      Assessment/Plan: s/p Procedure(s): EXPLORATORY LAPAROTOMY PARTIAL COLECTOMY WITH COLOSTOMY (N/A) OOB Cont  NGT D/C FOLEY  POD 2  ICE CHIPS  LOS: 11 days    Larry [name] A. 02/13/2013

## 2013-02-13 NOTE — Progress Notes (Signed)
[facility] PROGRESS NOTE Interim History: 62 year old male who presents to the emergency room for generalized weakness for approximately 4 weeks. Patient was also noted to have a chronic anemia. Patient was found to have acute renal failure on admission which has since improved. Patient with cirrhosis he Dr. On 12/26/2012 however this was canceled as patient was not feeling well. GI was consulted in the hospital. Colonoscopy did be placed showing a colonic mass, followed by CT scan which did show a mass with possible liver metastasis    Assessment/Plan: Colonic mass: - CT abd scan showed Circumferential mass involving the mid transverse colon is identified compatible with colon carcinoma. Severallow-attenuation foci are identified within the liver parenchyma - Colonoscopy  Obstructive mass on 12.19.2014 and Bx showed: INVASIVE ADENOCARCINOMA. - CEA 5.2  - CT chest: Subpleural nodule in the right upper lobe measures 5 mm.  - Surgery consulted and colectomy and colostomy on 12.23.2014. - Check a liver MRI.  Atrial fibrillation -Continue Coreg and Cardizem  Acute on Chronic Renal Failure: - Improving, creatinine 1.43  - Renal ultrasound showed no hydronephrosis  - May be secondary to medications versus dehydration   Hypotension - Likely secondary to hypovolemia  - Resolved   Normocytic anemia  - H/H 8/25.8, stable, however patient has had dark stools and has had POCT positive  - Multifactorial: GI related vs renal disease.- Patient has received 4units PRBCs this admission  - EGD showed antral erosions and duodenitis.  - Colonoscopy showed: as above.   B12 and folate deficiency  -B12 level Oct 2014 was quit low at 251 - repeated this admit 393 but given anemia, received loading dose  -Folate low so was given IV and now continues PO.  Hyperlipidemia  -treatment being held   Hx of diastolic CHF as well as ischemic CM/systolic CHF  -EF 45-50% via cath 2013  -Cardiology  consulted   COPD  -Well compensated at present and not requiring any medication  -Has Xopenex when necessary   Hx of CAD s/p PTCA w/ stents x 2 2010  -Asymptomatic (see above)   Obesity - Body mass index is 41.46 kg/(m^2).   Nausea  -Will add Zofran when necessary      Code Status: full Family Communication: wife  Disposition Plan: inpatient   Consultants:  GI  Surgery  cardiology  Procedures: - EGD showed antral erosions and duodenitis.  -Colonoscopy showed obstructing colon mass, transverse colon - Echocardiogram 12/21 Left ventricle: The cavity size was mildly dilated. Wall thickness was increased in a pattern of mild LVH. Systolic function was mildly reduced. The estimated ejection fraction was in the range of 45% to 50%.  - Colectomy and colostomy 02/12/2013   Antibiotics:  None (indicate start date, and stop date if known)  HPI/Subjective: Complaining of abd pain POD #1.  Objective: Filed Vitals:   02/13/13 0700 02/13/13 0800 02/13/13 0905 02/13/13 0907  BP: 119/83     Pulse: 89     Temp: 98.2 F (36.8 C)     TempSrc: Oral     Resp: 20 12 12 12   Height:      Weight:      SpO2: 98% 100% 100% 100%    Intake/Output Summary (Last 24 hours) at 02/13/13 1149 Last data filed at 02/13/13 1012  Gross per 24 hour  Intake   2355 ml  Output   2395 ml  Net    -40 ml   Filed Weights   02/08/13 2329 02/11/13 2102 02/12/13 2044  Weight: 134.3 kg (296 lb 1.2 oz) 133.448 kg (294 lb 3.2 oz) 135.172 kg (298 lb)    Exam:  General: Alert, awake, oriented x3, in no acute distress.  HEENT: No bruits, no goiter.  Heart: Regular rate and rhythm, without murmurs, rubs, gallops.  Lungs: Good air movement, clear to auscultation. Abdomen: Soft, nontender, nondistended, positive bowel sounds.    Data Reviewed: Basic Metabolic Panel:  Recent Labs Lab 02/09/13 0540 02/10/13 0635 02/11/13 0621 02/12/13 0430 02/13/13 0359  NA 137 139 137 141 140  K 4.0 4.0  4.0 4.1 4.3  CL 107 107 106 106 106  CO2 21 21 22 24 25   GLUCOSE 88 91 87 92 115*  BUN 12 10 8 8 9   CREATININE 1.67* 1.55* 1.48* 1.43* 1.45*  CALCIUM 9.2 9.3 9.3 9.4 8.9  PHOS  --  2.7  --   --   --    Liver Function Tests:  Recent Labs Lab 02/09/13 0540 02/10/13 0635 02/12/13 0430  AST 9  --  6  ALT 14  --  9  ALKPHOS 79  --  81  BILITOT 0.8  --  0.5  PROT 6.6  --  6.3  ALBUMIN 2.9* 2.8* 2.8*   No results found for this basename: LIPASE, AMYLASE,  in the last 168 hours No results found for this basename: AMMONIA,  in the last 168 hours CBC:  Recent Labs Lab 02/09/13 0540 02/10/13 0635 02/11/13 0621 02/12/13 0430 02/12/13 1958 02/13/13 0359  WBC 7.0  --  6.8 6.9 9.4 8.4  HGB 8.0* 7.9* 7.8* 8.0* 9.4* 9.3*  HCT 26.0* 26.3* 25.2* 25.8* 29.5* 30.3*  MCV 91.2  --  90.6 90.5 90.2 90.7  PLT 189  --  183 196 183 192   Cardiac Enzymes: No results found for this basename: CKTOTAL, CKMB, CKMBINDEX, TROPONINI,  in the last 168 hours BNP (last 3 results) No results found for this basename: PROBNP,  in the last 8760 hours CBG: No results found for this basename: GLUCAP,  in the last 168 hours  No results found for this or any previous visit (from the past 240 hour(s)).   Studies: No results found.  Scheduled Meds: . HYDROmorphone PCA 0.3 mg/mL   Intravenous Q4H  . metoprolol  5 mg Intravenous Q6H  . pantoprazole (PROTONIX) IV  40 mg Intravenous Q24H   Continuous Infusions: . lactated ringers 100 mL/hr at 02/12/13 1723     Marinda Elk  [facility] Pager [phone]. If 8PM-8AM, please contact night-coverage at [email or web address], password [facility] 02/13/2013, 11:49 AM  LOS: 11 days

## 2013-02-13 NOTE — Progress Notes (Signed)
Pt returned from MRI, still has not voided, states does not feel he needs to. Bladder scan shows 376 ml of urine in bladder. Gave patient urinal and encouraged attempt to void, will continue to monitor.

## 2013-02-14 LAB — COMPREHENSIVE METABOLIC PANEL
ALT: 6 U/L (ref 0–53)
AST: 7 U/L (ref 0–37)
Albumin: 2.5 g/dL — ABNORMAL LOW (ref 3.5–5.2)
CO2: 26 mEq/L (ref 19–32)
Calcium: 8.9 mg/dL (ref 8.4–10.5)
Chloride: 105 mEq/L (ref 96–112)
Creatinine, Ser: 1.44 mg/dL — ABNORMAL HIGH (ref 0.50–1.35)
GFR calc Af Amer: 60 mL/min — ABNORMAL LOW (ref >90)
GFR calc non Af Amer: 51 mL/min — ABNORMAL LOW (ref >90)
Sodium: 140 mEq/L (ref 135–145)
Total Bilirubin: 2.1 mg/dL — ABNORMAL HIGH (ref 0.3–1.2)

## 2013-02-14 MED ORDER — HEPARIN SODIUM (PORCINE) 5000 UNIT/ML IJ SOLN
5000.0000 [IU] | Freq: Three times a day (TID) | INTRAMUSCULAR | Status: DC
  Administered 2013-02-14 – 2013-02-18 (×12): 5000 [IU] via SUBCUTANEOUS
  Filled 2013-02-14 (×23): qty 1

## 2013-02-14 MED ORDER — ACETAMINOPHEN 10 MG/ML IV SOLN
1000.0000 mg | Freq: Four times a day (QID) | INTRAVENOUS | Status: AC
  Administered 2013-02-14 – 2013-02-15 (×3): 1000 mg via INTRAVENOUS
  Filled 2013-02-14 (×6): qty 100

## 2013-02-14 MED ORDER — ASPIRIN 81 MG PO CHEW
81.0000 mg | CHEWABLE_TABLET | Freq: Every day | ORAL | Status: DC
  Administered 2013-02-14 – 2013-02-17 (×4): 81 mg via ORAL
  Filled 2013-02-14 (×6): qty 1

## 2013-02-14 NOTE — Progress Notes (Signed)
RN assisted patient up to chair.  Patient very weak, unsteady on feet.

## 2013-02-14 NOTE — Progress Notes (Signed)
2 Days Post-Op  Subjective: He has not been out of bed, no IS, NG cannister is full, but nothing recorded.It's mostly clear so it may be ice chips.  Says his pain is a 6 with PCA.   No gas in ostomy bag, just some dark fluid  Objective: Vital signs in last 24 hours: Temp:  [98.2 F (36.8 C)-99.4 F (37.4 C)] 99.4 F (37.4 C) (12/25 0453) Pulse Rate:  [87-105] 104 (12/25 0453) Resp:  [12-32] 14 (12/25 0457) BP: (127-137)/(72-78) 127/78 mmHg (12/25 0453) SpO2:  [97 %-100 %] 97 % (12/25 0457) Last BM Date: 02/08/13 200 po RECORDED. DIET; None TM 99.4 Labs Stable Intake/Output from previous day: 12/24 0701 - 12/25 0700 In: 260 [P.O.:200; NG/GT:60] Out: 1150 [Urine:1150] Intake/Output this shift:    General appearance: alert, cooperative and no distress Resp: some wheezing bilat GI: large distended, some bowel sounds, dressing shows some blood, but it is overlapping colostomy dressing, so I will leave it till tomorrow.  Lab Results:   Recent Labs  02/12/13 1958 02/13/13 0359  WBC 9.4 8.4  HGB 9.4* 9.3*  HCT 29.5* 30.3*  PLT 183 192    BMET  Recent Labs  02/13/13 0359 02/14/13 0040  NA 140 140  K 4.3 3.8  CL 106 105  CO2 25 26  GLUCOSE 115* 108*  BUN 9 10  CREATININE 1.45* 1.44*  CALCIUM 8.9 8.9   PT/INR  Recent Labs  02/12/13 0430  LABPROT 13.8  INR 1.08     Recent Labs Lab 02/09/13 0540 02/10/13 0635 02/12/13 0430 02/14/13 0040  AST 9  --  6 7  ALT 14  --  9 6  ALKPHOS 79  --  81 72  BILITOT 0.8  --  0.5 2.1*  PROT 6.6  --  6.3 6.0  ALBUMIN 2.9* 2.8* 2.8* 2.5*     Lipase     Component Value Date/Time   LIPASE 15 11/05/2008 1215     Studies/Results: No results found.  Medications: . aspirin  81 mg Oral Daily  . HYDROmorphone PCA 0.3 mg/mL   Intravenous Q4H  . metoprolol  5 mg Intravenous Q6H  . pantoprazole (PROTONIX) IV  40 mg Intravenous Q24H    Assessment/Plan 1. Obstructing colon mass. (No BM for 2 weeks) Path: 1.  Stomach, biopsy  - MILD CHRONIC INACTIVE GASTRITIS (ANTRAL MUCOSA)  - WARTHIN STARRY STAIN NEGATIVE FOR H. PYLORI  2. Colon, biopsy, transverse mass  - INVASIVE ADENOCARCINOMA. S/P EXPLORATORY LAPAROTOMY PARTIAL COLECTOMY WITH COLOSTOMY (Transverse colectomy with colostomy and long Hartman's pouch) 02/12/2013, Cherylynn Ridges, MD.  2. Acute on chronic renal failure on admit (creatinine stable now 1.44) 3. Anemia with GI blood loss.  4. CAD with CM; EF 45-50% 05/2011.  5. Hx of 2 RCA stents 2010./hx of atrial tachycardia/NSVT on BB and CCB's.  6. COPD/tobacco use Hx.  7. Hx of ETOH heavy use 20+ years, quit 2010.  8. Obesity Body mass index is 41.6.  9. Dyslipidemia  10. Deconditioning/malnutrition, he has been inactive for 5 weeks and not eating.    Plan:  Mobilize, IV tylenol, IS, SCD and lovenox for DVT prophylaxis   LOS: 12 days    Lori Liew 02/14/2013

## 2013-02-14 NOTE — Progress Notes (Signed)
RN attempted to walk patient in hall, patient was able to get up from chair with one assist and make it to door from chair however unsteady on feet, felt very weak and tired.  Assisted patient back into bed after sitting in chair for approximately 4 hours.

## 2013-02-14 NOTE — Progress Notes (Signed)
Patient ID: Larry [name], male   DOB: 08-27-1950, 62 y.o.   MRN: [id number] Subjective:  No chest pain or sob. Complains of abdominal pain.  Objective:  Vital Signs in the last 24 hours: Temp:  [98.2 F (36.8 C)-99.4 F (37.4 C)] 99.4 F (37.4 C) (12/25 0453) Pulse Rate:  [87-105] 104 (12/25 0453) Resp:  [12-32] 14 (12/25 0457) BP: (119-137)/(72-83) 127/78 mmHg (12/25 0453) SpO2:  [97 %-100 %] 97 % (12/25 0457)  Intake/Output from previous day: 12/24 0701 - 12/25 0700 In: 260 [P.O.:200; NG/GT:60] Out: 1150 [Urine:1150] Intake/Output from this shift: Total I/O In: -  Out: 350 [Urine:350]  Physical Exam: Well appearing 62 yo man,NAD HEENT: Unremarkable Neck:  Supple Lungs:  CTA anteriorly HEART:  Regular rate rhythm Abd:  S/p abdominal surgery Ext:  No edema Skin:  No rashes no nodules Neuro:  CN II through XII intact, motor grossly intact  Lab Results:  Recent Labs  02/12/13 1958 02/13/13 0359  WBC 9.4 8.4  HGB 9.4* 9.3*  PLT 183 192    Recent Labs  02/13/13 0359 02/14/13 0040  NA 140 140  K 4.3 3.8  CL 106 105  CO2 25 26  GLUCOSE 115* 108*  BUN 9 10  CREATININE 1.45* 1.44*   Hepatic Function Panel  Recent Labs  02/14/13 0040  PROT 6.0  ALBUMIN 2.5*  AST 7  ALT 6  ALKPHOS 72  BILITOT 2.1*    Cardiac Studies: Tele - nsr to sinus tach with PAT Assessment/Plan:  1. S/P abdominal surgery-management per general surgery. 2. CAD with LV dyfunction -resume ASA given h/o RCA stent; resume statin later when he improves. 3. SVT-Pt remains in sinus; continue IV lopressor and convert to po when able; watch volume status postop as his lasix is on hold (no evidence of volume overload on exam).   LOS: 12 days    Brian Crenshaw,M.D. 02/14/2013, 6:10 AM

## 2013-02-14 NOTE — Progress Notes (Signed)
[facility] PROGRESS NOTE Interim History: 62 year old male who presents to the emergency room for generalized weakness for approximately 4 weeks. Patient was also noted to have a chronic anemia. Patient was found to have acute renal failure on admission which has since improved. Patient with cirrhosis he Dr. On 12/26/2012 however this was canceled as patient was not feeling well. GI was consulted in the hospital. Colonoscopy did be placed showing a colonic mass, followed by CT scan which did show a mass with possible liver metastasis    Assessment/Plan: Colonic mass: - CT abd scan showed Circumferential mass involving the mid transverse colon is identified compatible with colon carcinoma. Severallow-attenuation foci are identified within the liver parenchyma - Colonoscopy  Obstructive mass on 12.19.2014 and Bx showed: INVASIVE ADENOCARCINOMA. - CT chest: Subpleural nodule in the right upper lobe measures 5 mm.  - Surgery consulted and colectomy and colostomy on 12.23.2014. - Liver MRI did not showed enhancing lesions. - ambulate pt.  Hx of diastolic CHF as well as ischemic CM/systolic CHF  -EF 45-50% via cath 2013  -Cardiology consulted. - resume lasix when able to take Orals.  P. Atrial fibrillation -Continue Coreg and Cardizem.  Acute on Chronic Renal Failure: - Improving, creatinine 1.43  - Renal ultrasound showed no hydronephrosis  - May be secondary to medications versus dehydration   Hypotension - Likely secondary to hypovolemia  - Resolved   Normocytic anemia  - H/H 8/25.8, stable, however patient has had dark stools and has had POCT positive  - Multifactorial: GI related vs renal disease.- Patient has received 4units PRBCs this admission  - EGD showed antral erosions and duodenitis.  - Colonoscopy showed: as above.  B12 and folate deficiency  -B12 level Oct 2014 was quit low at 251 - repeated this admit 393 but given anemia, received loading dose  -Folate low  so was given IV and now continues PO.   COPD  -Well compensated at present and not requiring any medication  -Has Xopenex when necessary   Hx of CAD s/p PTCA w/ stents x 2 2010  -Asymptomatic (see above)   Obesity - Body mass index is 41.46 kg/(m^2).   Nausea  -Will add Zofran when necessary      Code Status: full Family Communication: wife  Disposition Plan: inpatient   Consultants:  GI  Surgery  cardiology  Procedures: - EGD showed antral erosions and duodenitis.  -Colonoscopy showed obstructing colon mass, transverse colon - Echocardiogram 12/21 Left ventricle: The cavity size was mildly dilated. Wall thickness was increased in a pattern of mild LVH. Systolic function was mildly reduced. The estimated ejection fraction was in the range of 45% to 50%.  - Colectomy and colostomy 02/12/2013   Antibiotics:  None (indicate start date, and stop date if known)  HPI/Subjective: No complains.  Objective: Filed Vitals:   02/14/13 0010 02/14/13 0453 02/14/13 0457 02/14/13 0935  BP:  127/78  124/79  Pulse:  104  98  Temp:  99.4 F (37.4 C)  97.3 F (36.3 C)  TempSrc:  Oral  Oral  Resp: 12 16 14 16   Height:      Weight:      SpO2: 97% 97% 97% 97%    Intake/Output Summary (Last 24 hours) at 02/14/13 1131 Last data filed at 02/14/13 0834  Gross per 24 hour  Intake    150 ml  Output   1300 ml  Net  -1150 ml   Filed Weights   02/08/13 2329 02/11/13 2102 02/12/13  2044  Weight: 134.3 kg (296 lb 1.2 oz) 133.448 kg (294 lb 3.2 oz) 135.172 kg (298 lb)    Exam:  General: Alert, awake, oriented x3, in no acute distress.  HEENT: No bruits, no goiter.  Heart: Regular rate and rhythm, without murmurs, rubs, gallops.  Lungs: Good air movement, clear to auscultation. Abdomen: Soft, nontender, nondistended, positive bowel sounds.    Data Reviewed: Basic Metabolic Panel:  Recent Labs Lab 02/10/13 0635 02/11/13 0621 02/12/13 0430 02/13/13 0359  02/14/13 0040  NA 139 137 141 140 140  K 4.0 4.0 4.1 4.3 3.8  CL 107 106 106 106 105  CO2 21 22 24 25 26   GLUCOSE 91 87 92 115* 108*  BUN 10 8 8 9 10   CREATININE 1.55* 1.48* 1.43* 1.45* 1.44*  CALCIUM 9.3 9.3 9.4 8.9 8.9  PHOS 2.7  --   --   --   --    Liver Function Tests:  Recent Labs Lab 02/09/13 0540 02/10/13 0635 02/12/13 0430 02/14/13 0040  AST 9  --  6 7  ALT 14  --  9 6  ALKPHOS 79  --  81 72  BILITOT 0.8  --  0.5 2.1*  PROT 6.6  --  6.3 6.0  ALBUMIN 2.9* 2.8* 2.8* 2.5*   No results found for this basename: LIPASE, AMYLASE,  in the last 168 hours No results found for this basename: AMMONIA,  in the last 168 hours CBC:  Recent Labs Lab 02/09/13 0540 02/10/13 0635 02/11/13 0621 02/12/13 0430 02/12/13 1958 02/13/13 0359  WBC 7.0  --  6.8 6.9 9.4 8.4  HGB 8.0* 7.9* 7.8* 8.0* 9.4* 9.3*  HCT 26.0* 26.3* 25.2* 25.8* 29.5* 30.3*  MCV 91.2  --  90.6 90.5 90.2 90.7  PLT 189  --  183 196 183 192   Cardiac Enzymes: No results found for this basename: CKTOTAL, CKMB, CKMBINDEX, TROPONINI,  in the last 168 hours BNP (last 3 results) No results found for this basename: PROBNP,  in the last 8760 hours CBG: No results found for this basename: GLUCAP,  in the last 168 hours  No results found for this or any previous visit (from the past 240 hour(s)).   Studies: Mr Liver W Contrast  02/14/2013   CLINICAL DATA:  Colonic mass, presumably colon cancer, at prior imaging, liver masses on prior CT  EXAM: MRI ABDOMEN WITHOUT CONTRAST  TECHNIQUE: Multiplanar multisequence MR imaging of the abdomen was performed after the administration of intravenous contrast.  CONTRAST:  10 cc Eovist IV contrast  COMPARISON:  CT 02/08/2013  FINDINGS: Small bilateral pleural effusions are identified. Evidence of midline abdominal incision and diverting right lower quadrant colostomy. Motion artifact is present on multiple series. This essentially obscures detail of the previously described sub cm  hepatic lesions, although no enhancement is identified at the largest of these masses measuring 6 mm within the right hepatic lobe, for example image 35 series 503 allowing for motion. On T2 weighted images, the previously seen sub cm hypodense lesions in the right hepatic lobe appear T2 hyperintense. The dome of medial segment left hepatic lobe 3 mm lesion appears T2 hyperintense but is too small for definitive characterization on postcontrast imaging allowing for motion.  Adrenal glands, spleen, pancreas, and gallbladder are normal. No ascites. Bilateral renal cortical cysts are noted.  IMPRESSION: Hepatic T2 hyperintense lesions are identified most compatible with biliary cysts or hamartomas. Because of patient motion, only the largest of these measuring 6 mm in  the posterior segment right hepatic lobe can be relatively confidently identified as lacking internal contrast enhancement. The others are too small for postcontrast evaluation due to patient motion. Further workup is as per the overall patient's periodic restaging plan.  Bilateral small pleural effusions.  Interval diverting right lower quadrant colostomy.   Electronically Signed   By: Christiana Pellant M.D.   On: 02/14/2013 09:09    Scheduled Meds: . acetaminophen  1,000 mg Intravenous Q6H  . aspirin  81 mg Oral Daily  . heparin subcutaneous  5,000 Units Subcutaneous Q8H  . HYDROmorphone PCA 0.3 mg/mL   Intravenous Q4H  . metoprolol  5 mg Intravenous Q6H  . pantoprazole (PROTONIX) IV  40 mg Intravenous Q24H   Continuous Infusions: . lactated ringers 100 mL/hr at 02/14/13 0737     Marinda Elk  [facility] Pager [phone]. If 8PM-8AM, please contact night-coverage at [email or web address], password [facility] 02/14/2013, 11:31 AM  LOS: 12 days

## 2013-02-14 NOTE — Consult Note (Signed)
WOC ostomy follow up Stoma type/location: RUQ end colostomy Stomal assessment/size: 1 and 3/4 inches round; proximal third is with sloughing mucosa, distal 2/3 is red, moist and healthy stomal mucosa.  Stomas is slightly budded and patent. Patient examined with Zola Button, PA who assessed midline with me.  We will place dry dressing over incision line approximated with staples at this time. No oozing noted. Peristomal assessment: clear, intact Treatment options for stomal/peristomal skin: None indicated Output None at this time. Patient still has NGT in.  Bedside RN will be getting him OOB to chair and hopefully will begin ambulation later today. Ostomy pouching: 2pc,2 and 1/4 inch (not 2 and 3/4 inch) pouching system applied. Barrier ring not used today as stoma is protruding enough at this time to warrant not using an additional piece of equipment.   Education provided: Patient reassured that WOC nursing team will follow.  He is resting and visiting with his family today as it is not only the holiday, but his 62th birthday today. WOC nursing team will follow, and will remain available to this patient, the nursing and medical and surgical teams.  Please re-consult if needed. Thanks, Ladona Mow, MSN, RN, GNP, [place], CWON-AP [phone])

## 2013-02-14 NOTE — Progress Notes (Signed)
Report given to nurse on 6N. Pt notified that he will be transferred to room 6N08.

## 2013-02-14 NOTE — Progress Notes (Signed)
Upper margin of the stoma is dusky, but lower part is completely viable.  CPM otherwise.  Marta Lamas. Gae Bon, MD, FACS [phone] [facility]

## 2013-02-15 DIAGNOSIS — I5033 Acute on chronic diastolic (congestive) heart failure: Secondary | ICD-10-CM

## 2013-02-15 MED ORDER — HYDROCODONE-ACETAMINOPHEN 5-325 MG PO TABS
1.0000 | ORAL_TABLET | ORAL | Status: DC | PRN
  Administered 2013-02-15 (×2): 1 via ORAL
  Administered 2013-02-16 (×3): 2 via ORAL
  Administered 2013-02-23 – 2013-02-26 (×2): 1 via ORAL
  Administered 2013-02-26: 2 via ORAL
  Filled 2013-02-15 (×2): qty 1
  Filled 2013-02-15: qty 2
  Filled 2013-02-15: qty 1
  Filled 2013-02-15 (×3): qty 2
  Filled 2013-02-15: qty 1
  Filled 2013-02-15: qty 2

## 2013-02-15 MED ORDER — HYDROMORPHONE HCL PF 1 MG/ML IJ SOLN
0.5000 mg | INTRAMUSCULAR | Status: DC | PRN
  Administered 2013-02-18: 1 mg via INTRAVENOUS
  Filled 2013-02-15: qty 1

## 2013-02-15 MED ORDER — FUROSEMIDE 10 MG/ML IJ SOLN
40.0000 mg | Freq: Once | INTRAMUSCULAR | Status: AC
  Administered 2013-02-15: 40 mg via INTRAVENOUS
  Filled 2013-02-15: qty 4

## 2013-02-15 NOTE — Progress Notes (Signed)
Physical Therapy Treatment Patient Details Name: Larry [name] MRN: [id number] DOB: 1950-12-18 Today's Date: 02/15/2013 Time: 4098-1191 PT Time Calculation (min): 14 min  PT Assessment / Plan / Recommendation  History of Present Illness Pt is a 62 y.o. male adm from home due to generalized weakness.  Patient with colon mass - adenocarcinoma.  Patient s/p colectomy and colostomy 12/23.   PT Comments   Patient able to participate with PT today.  Making slow improvements with mobility/gait.  Would recommend HHPT at discharge for continued therapy, transitioning to OP PT when appropriate for patient.  Follow Up Recommendations  Home health PT;Supervision/Assistance - 24 hour     Does the patient have the potential to tolerate intense rehabilitation     Barriers to Discharge        Equipment Recommendations  Rolling walker with 5" wheels    Recommendations for Other Services OT consult  Frequency Min 3X/week   Progress towards PT Goals Progress towards PT goals: Progressing toward goals  Plan Discharge plan needs to be updated;Equipment recommendations need to be updated    Precautions / Restrictions Precautions Precautions: Fall Restrictions Weight Bearing Restrictions: No   Pertinent Vitals/Pain     Mobility  Bed Mobility Bed Mobility: Not assessed (Patient in recliner as PT entered room) Transfers Transfers: Sit to Stand;Stand to Sit Sit to Stand: 4: Min assist;With upper extremity assist;With armrests;From chair/3-in-1 Stand to Sit: 4: Min assist;With upper extremity assist;With armrests;To chair/3-in-1 Details for Transfer Assistance: Verbal cues for hand placement.  Assist for balance initially.  Improved with time.   Ambulation/Gait Ambulation/Gait Assistance: 4: Min assist Ambulation Distance (Feet): 42 Feet Assistive device: Rolling walker Ambulation/Gait Assistance Details: Verbal cues for safe use of RW.  Gait slightly unsteady, with flexed posture.  Cues  to stand upright. Gait Pattern: Step-through pattern;Decreased stride length;Shuffle;Trunk flexed;Wide base of support Gait velocity: decreased      PT Goals (current goals can now be found in the care plan section)    Visit Information  Last PT Received On: 02/15/13 Assistance Needed: +1 History of Present Illness: Pt is a 62 y.o. male adm from home due to generalized weakness.  Patient with colon mass - adenocarcinoma.  Patient s/p colectomy and colostomy 12/23.    Subjective Data  Subjective: "I'll try"   Cognition  Cognition Arousal/Alertness: Awake/alert Behavior During Therapy: WFL for tasks assessed/performed Overall Cognitive Status: Within Functional Limits for tasks assessed    Balance     End of Session PT - End of Session Equipment Utilized During Treatment: Gait belt Activity Tolerance: Patient limited by fatigue Patient left: in chair;with call bell/phone within reach;with family/visitor present;with nursing/sitter in room Nurse Communication: Mobility status   GP     Vena Austria 02/15/2013, 2:55 PM Durenda Hurt. Renaldo Fiddler, [facility] Pager [phone]

## 2013-02-15 NOTE — Progress Notes (Signed)
3 Days Post-Op  Subjective: Pt feels good, pain better controlled.  No N/V.  NG tube had 1950, but only in cannister this am.  Good BS and flatus in bag.  Annoyed by O2 monitor.    Objective: Vital signs in last 24 hours: Temp:  [97.9 F (36.6 C)-98.7 F (37.1 C)] 98.2 F (36.8 C) (12/26 0538) Pulse Rate:  [94-96] 96 (12/26 0538) Resp:  [8-12] 12 (12/26 0924) BP: (122-150)/(72-90) 150/90 mmHg (12/26 0538) SpO2:  [94 %-99 %] 99 % (12/26 0924) Last BM Date: 02/08/13  Intake/Output from previous day: 12/25 0701 - 12/26 0700 In: 1700 [I.V.:1600; IV Piggyback:100] Out: 3350 [Urine:1400; Emesis/NG output:1950] Intake/Output this shift: Total I/O In: -  Out: 300 [Urine:300]  PE: Gen:  Alert, NAD, pleasant Abd: Soft, NT/ND, +BS, no HSM, midline wound clean, ostomy dark purple/ischemic on left upper half but viable lower right half.  Ostomy patent.  Flatus in bag, but no BM, some bloody drainage noted in bag.     Lab Results:   Recent Labs  02/12/13 1958 02/13/13 0359  WBC 9.4 8.4  HGB 9.4* 9.3*  HCT 29.5* 30.3*  PLT 183 192   BMET  Recent Labs  02/13/13 0359 02/14/13 0040  NA 140 140  K 4.3 3.8  CL 106 105  CO2 25 26  GLUCOSE 115* 108*  BUN 9 10  CREATININE 1.45* 1.44*  CALCIUM 8.9 8.9   PT/INR No results found for this basename: LABPROT, INR,  in the last 72 hours CMP     Component Value Date/Time   NA 140 02/14/2013 0040   K 3.8 02/14/2013 0040   CL 105 02/14/2013 0040   CO2 26 02/14/2013 0040   GLUCOSE 108* 02/14/2013 0040   BUN 10 02/14/2013 0040   CREATININE 1.44* 02/14/2013 0040   CALCIUM 8.9 02/14/2013 0040   PROT 6.0 02/14/2013 0040   ALBUMIN 2.5* 02/14/2013 0040   AST 7 02/14/2013 0040   ALT 6 02/14/2013 0040   ALKPHOS 72 02/14/2013 0040   BILITOT 2.1* 02/14/2013 0040   GFRNONAA 51* 02/14/2013 0040   GFRAA 60* 02/14/2013 0040   Lipase     Component Value Date/Time   LIPASE 15 11/05/2008 1215       Studies/Results: Mr Liver  W Contrast  02/14/2013   CLINICAL DATA:  Colonic mass, presumably colon cancer, at prior imaging, liver masses on prior CT  EXAM: MRI ABDOMEN WITHOUT CONTRAST  TECHNIQUE: Multiplanar multisequence MR imaging of the abdomen was performed after the administration of intravenous contrast.  CONTRAST:  10 cc Eovist IV contrast  COMPARISON:  CT 02/08/2013  FINDINGS: Small bilateral pleural effusions are identified. Evidence of midline abdominal incision and diverting right lower quadrant colostomy. Motion artifact is present on multiple series. This essentially obscures detail of the previously described sub cm hepatic lesions, although no enhancement is identified at the largest of these masses measuring 6 mm within the right hepatic lobe, for example image 35 series 503 allowing for motion. On T2 weighted images, the previously seen sub cm hypodense lesions in the right hepatic lobe appear T2 hyperintense. The dome of medial segment left hepatic lobe 3 mm lesion appears T2 hyperintense but is too small for definitive characterization on postcontrast imaging allowing for motion.  Adrenal glands, spleen, pancreas, and gallbladder are normal. No ascites. Bilateral renal cortical cysts are noted.  IMPRESSION: Hepatic T2 hyperintense lesions are identified most compatible with biliary cysts or hamartomas. Because of patient motion, only the largest  of these measuring 6 mm in the posterior segment right hepatic lobe can be relatively confidently identified as lacking internal contrast enhancement. The others are too small for postcontrast evaluation due to patient motion. Further workup is as per the overall patient's periodic restaging plan.  Bilateral small pleural effusions.  Interval diverting right lower quadrant colostomy.   Electronically Signed   By: Christiana Pellant M.D.   On: 02/14/2013 09:09    Anti-infectives: Anti-infectives   Start     Dose/Rate Route Frequency Ordered Stop   02/12/13 2330  cefoTEtan  (CEFOTAN) 1 g in dextrose 5 % 50 mL IVPB     1 g 100 mL/hr over 30 Minutes Intravenous  Once 02/12/13 1717 02/13/13 0024   02/12/13 0600  [MAR Hold]  cefoTEtan (CEFOTAN) 2 g in dextrose 5 % 50 mL IVPB     (On MAR Hold since 02/12/13 1206)   2 g 100 mL/hr over 30 Minutes Intravenous On call to O.R. 02/11/13 1706 02/12/13 1216       Assessment/Plan 1. Obstructing colon mass. (No BM for 2 weeks)  - Path: 1. Stomach, biopsy  - MILD CHRONIC INACTIVE GASTRITIS (ANTRAL MUCOSA)  - WARTHIN STARRY STAIN NEGATIVE FOR H. PYLORI  2. Colon, biopsy, transverse mass  - INVASIVE ADENOCARCINOMA.  S/P EXPLORATORY LAPAROTOMY PARTIAL COLECTOMY WITH COLOSTOMY (Transverse colectomy with colostomy and long Hartman's pouch) 02/12/2013, Cherylynn Ridges, MD.   3. Acute on chronic renal failure on admit (creatinine stable now 1.44)  4. Anemia with GI blood loss.  5. CAD with CM; EF 45-50% 05/2011.  6. Hx of 2 RCA stents 2010./hx of atrial tachycardia/NSVT on BB and CCB's.  7. COPD/tobacco use Hx.  8. Hx of ETOH heavy use 20+ years, quit 2010.  9. Obesity Body mass index is 41.6.  10. Dyslipidemia  11. Deconditioning/malnutrition, he has been inactive for 5 weeks and not eating.   Plan:  1.  Mobilize, IS, SCD and lovenox for DVT prophylaxis 2.  D/c PCA and start IV prn and orals when tolerating PO 3.  Repeat labs tomorrow am 4.  Continue to watch ostomy closely secondary to ischemic changes 5.  D/C NG tube and start sips of clears 6.  Home when tolerating regular diet and having BM's    LOS: 13 days    Larry [name], Larry [name] 02/15/2013, 10:10 AM Pager: [phone]

## 2013-02-15 NOTE — Consult Note (Signed)
WOC ostomy follow up Stoma type/location: RUQ end colostomy (ascending). Up in chair and looking stronger and better today. Still not eating.Some flatus but stool in pouch. Stomal assessment/size: 1 and 3/4 inches round Peristomal assessment: Not seen today Treatment options for stomal/peristomal skin: None indicated Output small amount of serosanguinous effluent Ostomy pouching: 2pc., 2 and 1/4 inch system applied yesterday is intact. Education provided: Patient taught how to perform Lock and Roll closure; demonstration observed once and patient able to perform x 2 with ostomy RN coaching.  Given educational booklet Understanding your Colostomy and a spare ostomy pouching system upon which to practice the Lock and Roll closure.  Informed that I would see tomorrow for another teaching session. WOC nursing team will follow,and will remain available to this patient, the nursing and surgical team.  Please re-consult if needed between visits. Thanks, Ladona Mow, MSN, RN, GNP, [place], CWON-AP [phone])

## 2013-02-15 NOTE — Progress Notes (Signed)
[facility] PROGRESS NOTE Interim History: 62 year old male who presents to the emergency room for generalized weakness for approximately 4 weeks. Patient was also noted to have a chronic anemia. Patient was found to have acute renal failure on admission which has since improved. Patient with cirrhosis he Dr. On 12/26/2012 however this was canceled as patient was not feeling well. GI was consulted in the hospital. Colonoscopy did be placed showing a colonic mass, followed by CT scan which did show a mass with possible liver metastasis    Assessment/Plan: Colonic mass: - CT abd scan showed Circumferential mass involving the mid transverse colon is identified compatible with colon carcinoma. Severallow-attenuation foci are identified within the liver parenchyma - Colonoscopy  Obstructive mass on 12.19.2014 and Bx showed: INVASIVE ADENOCARCINOMA. - CT chest: Subpleural nodule in the right upper lobe measures 5 mm.  - Surgery consulted and colectomy and colostomy on 12.23.2014. - Liver MRI did not showed enhancing lesions. - ambulate pt. Diet per surgery, BM or flatus.  Hx of diastolic CHF as well as ischemic CM/systolic CHF  - EF 45-50% via cath 2013  - Cardiology consulted. - complaining of mild SOB one dose of lasix IV.  P. Atrial fibrillation -Continue Coreg and Cardizem.  Acute on Chronic Renal Failure: - Improving, creatinine 1.43  - Renal ultrasound showed no hydronephrosis  - May be secondary to medications versus dehydration   Hypotension - Likely secondary to hypovolemia  - Resolved   Normocytic anemia  - H/H 8/25.8, stable, however patient has had dark stools and has had POCT positive  - Multifactorial: GI related vs renal disease.- Patient has received 4units PRBCs this admission  - EGD showed antral erosions and duodenitis.  - Colonoscopy showed: as above.  B12 and folate deficiency  -B12 level Oct 2014 was quit low at 251 - repeated this admit 393 but given  anemia, received loading dose  -Folate low so was given IV and now continues PO.   COPD  -Well compensated at present and not requiring any medication  -Has Xopenex when necessary   Hx of CAD s/p PTCA w/ stents x 2 2010  -Asymptomatic (see above)   Obesity - Body mass index is 41.46 kg/(m^2).   Nausea  -Will add Zofran when necessary      Code Status: full Family Communication: wife  Disposition Plan: inpatient   Consultants:  GI  Surgery  cardiology  Procedures: - EGD showed antral erosions and duodenitis.  -Colonoscopy showed obstructing colon mass, transverse colon - Echocardiogram 12/21 Left ventricle: The cavity size was mildly dilated. Wall thickness was increased in a pattern of mild LVH. Systolic function was mildly reduced. The estimated ejection fraction was in the range of 45% to 50%.  - Colectomy and colostomy 02/12/2013   Antibiotics:  None (indicate start date, and stop date if known)  HPI/Subjective: No complains. No flatus  Objective: Filed Vitals:   02/15/13 0059 02/15/13 0447 02/15/13 0538 02/15/13 0924  BP:   150/90   Pulse:   96   Temp:   98.2 F (36.8 C)   TempSrc:   Oral   Resp: 10 10 12 12   Height:      Weight:      SpO2: 98% 98% 99% 99%    Intake/Output Summary (Last 24 hours) at 02/15/13 1024 Last data filed at 02/15/13 1014  Gross per 24 hour  Intake   1700 ml  Output   3600 ml  Net  -1900 ml   Ceasar Mons  Weights   02/08/13 2329 02/11/13 2102 02/12/13 2044  Weight: 134.3 kg (296 lb 1.2 oz) 133.448 kg (294 lb 3.2 oz) 135.172 kg (298 lb)    Exam:  General: Alert, awake, oriented x3, in no acute distress.  HEENT: No bruits, no goiter.  Heart: Regular rate and rhythm, without murmurs, rubs, gallops.  Lungs: Good air movement, clear to auscultation.    Data Reviewed: Basic Metabolic Panel:  Recent Labs Lab 02/10/13 0635 02/11/13 0621 02/12/13 0430 02/13/13 0359 02/14/13 0040  NA 139 137 141 140 140  K 4.0 4.0  4.1 4.3 3.8  CL 107 106 106 106 105  CO2 21 22 24 25 26   GLUCOSE 91 87 92 115* 108*  BUN 10 8 8 9 10   CREATININE 1.55* 1.48* 1.43* 1.45* 1.44*  CALCIUM 9.3 9.3 9.4 8.9 8.9  PHOS 2.7  --   --   --   --    Liver Function Tests:  Recent Labs Lab 02/09/13 0540 02/10/13 0635 02/12/13 0430 02/14/13 0040  AST 9  --  6 7  ALT 14  --  9 6  ALKPHOS 79  --  81 72  BILITOT 0.8  --  0.5 2.1*  PROT 6.6  --  6.3 6.0  ALBUMIN 2.9* 2.8* 2.8* 2.5*   No results found for this basename: LIPASE, AMYLASE,  in the last 168 hours No results found for this basename: AMMONIA,  in the last 168 hours CBC:  Recent Labs Lab 02/09/13 0540 02/10/13 0635 02/11/13 0621 02/12/13 0430 02/12/13 1958 02/13/13 0359  WBC 7.0  --  6.8 6.9 9.4 8.4  HGB 8.0* 7.9* 7.8* 8.0* 9.4* 9.3*  HCT 26.0* 26.3* 25.2* 25.8* 29.5* 30.3*  MCV 91.2  --  90.6 90.5 90.2 90.7  PLT 189  --  183 196 183 192   Cardiac Enzymes: No results found for this basename: CKTOTAL, CKMB, CKMBINDEX, TROPONINI,  in the last 168 hours BNP (last 3 results) No results found for this basename: PROBNP,  in the last 8760 hours CBG: No results found for this basename: GLUCAP,  in the last 168 hours  No results found for this or any previous visit (from the past 240 hour(s)).   Studies: Mr Liver W Contrast  02/14/2013   CLINICAL DATA:  Colonic mass, presumably colon cancer, at prior imaging, liver masses on prior CT  EXAM: MRI ABDOMEN WITHOUT CONTRAST  TECHNIQUE: Multiplanar multisequence MR imaging of the abdomen was performed after the administration of intravenous contrast.  CONTRAST:  10 cc Eovist IV contrast  COMPARISON:  CT 02/08/2013  FINDINGS: Small bilateral pleural effusions are identified. Evidence of midline abdominal incision and diverting right lower quadrant colostomy. Motion artifact is present on multiple series. This essentially obscures detail of the previously described sub cm hepatic lesions, although no enhancement is  identified at the largest of these masses measuring 6 mm within the right hepatic lobe, for example image 35 series 503 allowing for motion. On T2 weighted images, the previously seen sub cm hypodense lesions in the right hepatic lobe appear T2 hyperintense. The dome of medial segment left hepatic lobe 3 mm lesion appears T2 hyperintense but is too small for definitive characterization on postcontrast imaging allowing for motion.  Adrenal glands, spleen, pancreas, and gallbladder are normal. No ascites. Bilateral renal cortical cysts are noted.  IMPRESSION: Hepatic T2 hyperintense lesions are identified most compatible with biliary cysts or hamartomas. Because of patient motion, only the largest of these measuring 6 mm  in the posterior segment right hepatic lobe can be relatively confidently identified as lacking internal contrast enhancement. The others are too small for postcontrast evaluation due to patient motion. Further workup is as per the overall patient's periodic restaging plan.  Bilateral small pleural effusions.  Interval diverting right lower quadrant colostomy.   Electronically Signed   By: Christiana Pellant M.D.   On: 02/14/2013 09:09    Scheduled Meds: . aspirin  81 mg Oral Daily  . heparin subcutaneous  5,000 Units Subcutaneous Q8H  . HYDROmorphone PCA 0.3 mg/mL   Intravenous Q4H  . metoprolol  5 mg Intravenous Q6H  . pantoprazole (PROTONIX) IV  40 mg Intravenous Q24H   Continuous Infusions: . lactated ringers 100 mL/hr at 02/14/13 1815     Marinda Elk  [facility] Pager [phone]. If 8PM-8AM, please contact night-coverage at [email or web address], password [facility] 02/15/2013, 10:24 AM  LOS: 13 days

## 2013-02-15 NOTE — Progress Notes (Signed)
Pathology is back and the patient does not  Have any lymph node involvement although it does extend into the pericolonic fatty omentum.  Stoma is the same.  Not much output yet but minimal NGT output.  Marta Lamas. Gae Bon, MD, FACS [phone] [facility]

## 2013-02-15 NOTE — Progress Notes (Signed)
Patient ID: Larry [name], male   DOB: 1950-09-05, 62 y.o.   MRN: [id number] Subjective:  No chest pain; mild dyspnea. Complains of abdominal pain but improving.  Objective:  Vital Signs in the last 24 hours: Temp:  [97.3 F (36.3 C)-98.7 F (37.1 C)] 98.2 F (36.8 C) (12/26 0538) Pulse Rate:  [94-98] 96 (12/26 0538) Resp:  [8-16] 12 (12/26 0538) BP: (122-150)/(72-90) 150/90 mmHg (12/26 0538) SpO2:  [94 %-99 %] 99 % (12/26 0538)  Intake/Output from previous day: 12/25 0701 - 12/26 0700 In: 1700 [I.V.:1600; IV Piggyback:100] Out: 3350 [Urine:1400; Emesis/NG output:1950] Intake/Output from this shift:    Physical Exam: Well appearing 62 yo man,NAD HEENT: Unremarkable Neck:  Supple Lungs:  Diminished BS bases HEART:  Regular rate rhythm Abd:  S/p abdominal surgery Ext:  No edema Skin:  No rashes no nodules Neuro:  CN II through XII intact, motor grossly intact  Lab Results:  Recent Labs  02/12/13 1958 02/13/13 0359  WBC 9.4 8.4  HGB 9.4* 9.3*  PLT 183 192    Recent Labs  02/13/13 0359 02/14/13 0040  NA 140 140  K 4.3 3.8  CL 106 105  CO2 25 26  GLUCOSE 115* 108*  BUN 9 10  CREATININE 1.45* 1.44*   Hepatic Function Panel  Recent Labs  02/14/13 0040  PROT 6.0  ALBUMIN 2.5*  AST 7  ALT 6  ALKPHOS 72  BILITOT 2.1*    Cardiac Studies: Tele - nsr to sinus tach with PVCs Assessment/Plan:  1. S/P abdominal surgery-management per general surgery. 2. CAD with LV dyfunction -Continue ASA; resume statin later when he improves. 3. SVT-Pt remains in sinus; continue IV lopressor and convert to po when able. 4. Postoperative volume excess/acute on chronic diastolic CHF-patient complains of mild dyspnea this AM; will give one dose lasix and follow; repeat BMET in AM.  Brian Crenshaw,M.D. 02/15/2013, 7:06 AM

## 2013-02-16 DIAGNOSIS — C189 Malignant neoplasm of colon, unspecified: Principal | ICD-10-CM

## 2013-02-16 DIAGNOSIS — R5381 Other malaise: Secondary | ICD-10-CM

## 2013-02-16 DIAGNOSIS — E46 Unspecified protein-calorie malnutrition: Secondary | ICD-10-CM

## 2013-02-16 LAB — TYPE AND SCREEN
ABO/RH(D): AB NEG
Antibody Screen: NEGATIVE
Unit division: 0

## 2013-02-16 LAB — CBC
HCT: 28 % — ABNORMAL LOW (ref 39.0–52.0)
Hemoglobin: 8.7 g/dL — ABNORMAL LOW (ref 13.0–17.0)
MCH: 27.8 pg (ref 26.0–34.0)
MCV: 89.5 fL (ref 78.0–100.0)
Platelets: 199 10*3/uL (ref 150–400)
RDW: 16.9 % — ABNORMAL HIGH (ref 11.5–15.5)
WBC: 5.9 10*3/uL (ref 4.0–10.5)

## 2013-02-16 LAB — BASIC METABOLIC PANEL
BUN: 8 mg/dL (ref 6–23)
CO2: 29 mEq/L (ref 19–32)
Calcium: 9.2 mg/dL (ref 8.4–10.5)
Chloride: 100 mEq/L (ref 96–112)
Creatinine, Ser: 1.16 mg/dL (ref 0.50–1.35)
GFR calc Af Amer: 77 mL/min — ABNORMAL LOW (ref >90)
GFR calc non Af Amer: 67 mL/min — ABNORMAL LOW (ref >90)
Glucose, Bld: 89 mg/dL (ref 70–99)
Potassium: 3.3 mEq/L — ABNORMAL LOW (ref 3.5–5.1)

## 2013-02-16 MED ORDER — POTASSIUM CHLORIDE CRYS ER 20 MEQ PO TBCR: 40.0000 meq | EXTENDED_RELEASE_TABLET | Freq: Two times a day (BID) | ORAL | Status: DC

## 2013-02-16 MED ORDER — SODIUM CHLORIDE 0.9 % IV SOLN
INTRAVENOUS | Status: DC
  Administered 2013-02-16 – 2013-02-17 (×2): via INTRAVENOUS
  Administered 2013-02-18: 75 mL/h via INTRAVENOUS
  Administered 2013-02-20 – 2013-02-23 (×4): via INTRAVENOUS
  Administered 2013-02-25: 20 mL/h via INTRAVENOUS

## 2013-02-16 NOTE — Progress Notes (Signed)
     Patient ID: Larry [name], male   DOB: 18-Sep-1950, 62 y.o.   MRN: [id number] Subjective:   62 yo male, hx of HTN, hyperlipidemia, amemia diastolic CHF, COPD, NSVT, CAD ( stenting of RCA) admitted with weakness, renal failure and colonic mass.  He had hemicolectomy on 12/23.  CT revealed liver mass - likely due to mets  He has been stable from a cardiac standpoint.   Objective:  Vital Signs in the last 24 hours: Temp:  [98.6 F (37 C)-99.4 F (37.4 C)] 99.4 F (37.4 C) (12/27 0542) Pulse Rate:  [98-101] 98 (12/27 0542) Resp:  [12-18] 18 (12/27 0542) BP: (130-168)/(91-96) 168/96 mmHg (12/27 0542) SpO2:  [96 %-100 %] 100 % (12/27 0542)  Intake/Output from previous day: 12/26 0701 - 12/27 0700 In: 2300 [I.V.:2300] Out: 4050 [Urine:3900; Emesis/NG output:150] Intake/Output from this shift:    Physical Exam: Well appearing 62 yo man,NAD HEENT: Unremarkable Neck:  Supple Lungs:  Diminished BS bases HEART:  Reg Rate, normal S1 S2  Abd:  S/p abdominal surgery, few bowel sounds Ext:  No edema Skin:  No rashes no nodules Neuro:  CN II through XII intact, motor grossly intact  Lab Results:  Recent Labs  02/16/13 0512  WBC 5.9  HGB 8.7*  PLT 199    Recent Labs  02/14/13 0040 02/16/13 0512  NA 140 139  K 3.8 3.3*  CL 105 100  CO2 26 29  GLUCOSE 108* 89  BUN 10 8  CREATININE 1.44* 1.16   Hepatic Function Panel  Recent Labs  02/14/13 0040  PROT 6.0  ALBUMIN 2.5*  AST 7  ALT 6  ALKPHOS 72  BILITOT 2.1*    Cardiac Studies: Tele - NSR at 85  Assessment/Plan:  1.  ? Colon Cancer with liver mets:  S/P abdominal surgery-management per general surgery.   2. CAD with LV dyfunction -Continue ASA; resume statin later when he improves. 3. SVT-Pt remains in sinus; continue IV lopressor and convert to po when able. 4. Postoperative volume excess/acute on chronic diastolic CHF-  Received lasix yesterday with diuresis.  Feels ok this am.  No further  dyspnea.   Cont. meds - change to po soon   Following   Alvia Grove., MD, [facility] 02/16/2013, 7:55 AM Office - [phone] Pager [phone]

## 2013-02-16 NOTE — Progress Notes (Signed)
[facility] PROGRESS NOTE Interim History: 62 year old male who presents to the emergency room for generalized weakness for approximately 4 weeks. Patient was also noted to have a chronic anemia. Patient was found to have acute renal failure on admission which has since improved. Patient with cirrhosis he Dr. On 12/26/2012 however this was canceled as patient was not feeling well. GI was consulted in the hospital. Colonoscopy did be placed showing a colonic mass, followed by CT scan which did show a mass with possible liver metastasis   Assessment/Plan: Colonic mass: - CT abd scan showed Circumferential mass involving the mid transverse colon is identified compatible with colon carcinoma. Severallow-attenuation foci are identified within the liver parenchyma - Colonoscopy  Obstructive mass on 12.19.2014 and Bx showed: INVASIVE ADENOCARCINOMA. - CT chest: Subpleural nodule in the right upper lobe measures 5 mm.  - Surgery consulted and colectomy and colostomy on 12.23.2014. - Liver MRI did not showed enhancing lesions. Will need follow up with oncology as an outpatient. - Ambulate pt. Clear liq diet, had  BM and  flatus.  Hx of diastolic CHF as well as ischemic CM/systolic CHF  - EF 45-50% via cath 2013  - Cardiology consulted. - Complaining of mild SOB one dose of lasix IV.  P. Atrial fibrillation -Continue Coreg and Cardizem.  Acute on Chronic Renal Failure: - Improving, creatinine 1.43  - Renal ultrasound showed no hydronephrosis  - May be secondary to medications versus dehydration   Hypotension - Likely secondary to hypovolemia  - Resolved   Normocytic anemia  - H/H 8/25.8, stable, however patient has had dark stools and has had POCT positive  - Multifactorial: GI related vs renal disease.- Patient has received 4units PRBCs this admission  - EGD showed antral erosions and duodenitis.  - Colonoscopy showed: as above.  B12 and folate deficiency  -B12 level Oct 2014 was  quit low at 251 - repeated this admit 393 but given anemia, received loading dose  -Folate low so was given IV and now continues PO.   COPD  -Well compensated at present and not requiring any medication  -Has Xopenex when necessary   Hx of CAD s/p PTCA w/ stents x 2 2010  -Asymptomatic (see above)   Obesity - Body mass index is 41.46 kg/(m^2).   Nausea  -Will add Zofran when necessary      Code Status: full Family Communication: wife  Disposition Plan: inpatient   Consultants:  GI  Surgery  cardiology  Procedures: - EGD showed antral erosions and duodenitis.  -Colonoscopy showed obstructing colon mass, transverse colon - Echocardiogram 12/21 Left ventricle: The cavity size was mildly dilated. Wall thickness was increased in a pattern of mild LVH. Systolic function was mildly reduced. The estimated ejection fraction was in the range of 45% to 50%.  - Colectomy and colostomy 02/12/2013   Antibiotics:  None (indicate start date, and stop date if known)  HPI/Subjective: No complains.  Objective: Filed Vitals:   02/15/13 0924 02/15/13 1406 02/15/13 2124 02/16/13 0542  BP:  157/95 130/91 168/96  Pulse:  101 99 98  Temp:  98.6 F (37 C) 99 F (37.2 C) 99.4 F (37.4 C)  TempSrc:  Oral Oral Oral  Resp: 12 15 16 18   Height:      Weight:      SpO2: 99% 96% 99% 100%    Intake/Output Summary (Last 24 hours) at 02/16/13 1005 Last data filed at 02/16/13 0900  Gross per 24 hour  Intake   2300  ml  Output   4250 ml  Net  -1950 ml   Filed Weights   02/08/13 2329 02/11/13 2102 02/12/13 2044  Weight: 134.3 kg (296 lb 1.2 oz) 133.448 kg (294 lb 3.2 oz) 135.172 kg (298 lb)    Exam:  General: Alert, awake, oriented x3, in no acute distress.  HEENT: No bruits, no goiter.  Heart: Regular rate and rhythm, without murmurs, rubs, gallops.  Lungs: Good air movement, clear to auscultation.    Data Reviewed: Basic Metabolic Panel:  Recent Labs Lab 02/10/13 0635  02/11/13 0621 02/12/13 0430 02/13/13 0359 02/14/13 0040 02/16/13 0512  NA 139 137 141 140 140 139  K 4.0 4.0 4.1 4.3 3.8 3.3*  CL 107 106 106 106 105 100  CO2 21 22 24 25 26 29   GLUCOSE 91 87 92 115* 108* 89  BUN 10 8 8 9 10 8   CREATININE 1.55* 1.48* 1.43* 1.45* 1.44* 1.16  CALCIUM 9.3 9.3 9.4 8.9 8.9 9.2  PHOS 2.7  --   --   --   --   --    Liver Function Tests:  Recent Labs Lab 02/10/13 0635 02/12/13 0430 02/14/13 0040  AST  --  6 7  ALT  --  9 6  ALKPHOS  --  81 72  BILITOT  --  0.5 2.1*  PROT  --  6.3 6.0  ALBUMIN 2.8* 2.8* 2.5*   No results found for this basename: LIPASE, AMYLASE,  in the last 168 hours No results found for this basename: AMMONIA,  in the last 168 hours CBC:  Recent Labs Lab 02/11/13 0621 02/12/13 0430 02/12/13 1958 02/13/13 0359 02/16/13 0512  WBC 6.8 6.9 9.4 8.4 5.9  HGB 7.8* 8.0* 9.4* 9.3* 8.7*  HCT 25.2* 25.8* 29.5* 30.3* 28.0*  MCV 90.6 90.5 90.2 90.7 89.5  PLT 183 196 183 192 199   Cardiac Enzymes: No results found for this basename: CKTOTAL, CKMB, CKMBINDEX, TROPONINI,  in the last 168 hours BNP (last 3 results) No results found for this basename: PROBNP,  in the last 8760 hours CBG: No results found for this basename: GLUCAP,  in the last 168 hours  No results found for this or any previous visit (from the past 240 hour(s)).   Studies: No results found.  Scheduled Meds: . aspirin  81 mg Oral Daily  . heparin subcutaneous  5,000 Units Subcutaneous Q8H  . metoprolol  5 mg Intravenous Q6H  . pantoprazole (PROTONIX) IV  40 mg Intravenous Q24H   Continuous Infusions: . lactated ringers 100 mL/hr at 02/16/13 0108     Marinda Elk  [facility] Pager [phone]. If 8PM-8AM, please contact night-coverage at [email or web address], password [facility] 02/16/2013, 10:05 AM  LOS: 14 days

## 2013-02-16 NOTE — Consult Note (Addendum)
WOC ostomy follow up Stoma type/location: RUQ colostomy.  Still no significant return of bowel function; some flatus, no stool, serosanguinous effluent in pouch. Stomal assessment/size: visualized through pouch, unchanged. Peristomal assessment: Not seen today Treatment options for stomal/peristomal skin: None inidicated Output as above Ostomy pouching: 2pc., 2 and 1/4 inch pouching system Education provided: Patient provided with demonstration of pouch emptying and cleaning tail portion of pouch with toilet paper wicks (one dampened, one dry).  50 ml of serosanguinous effluent emptied from pouch. Demonstration of making of toilet paper wicks x3.  Patient demonstrated closure of pouch with Lock and Roll; he has mastered this skill. Patient reports that her reviewed patient education booklet last evening.  Question answered regarding how many times each day that pouch might have to be emptied (responded 5-7). WOC nursing team will follow, and will remain available to this patient, the nursing and surgical teams.  Please re-consult if needed.  Thanks, Ladona Mow, MSN, RN, GNP, [place], CWON-AP [phone])

## 2013-02-16 NOTE — Progress Notes (Signed)
4 Days Post-Op  Subjective: Pt feels great, no N/V glad to have NG out.  Wants to eat.  Abdominal pain well controlled.  Flatus noted in ostomy bag, no significant BM yet.  Ostomy still purple in upper left half.  Working with PT/OT.    Objective: Vital signs in last 24 hours: Temp:  [98.6 F (37 C)-99.4 F (37.4 C)] 99.4 F (37.4 C) (12/27 0542) Pulse Rate:  [98-101] 98 (12/27 0542) Resp:  [12-18] 18 (12/27 0542) BP: (130-168)/(91-96) 168/96 mmHg (12/27 0542) SpO2:  [96 %-100 %] 100 % (12/27 0542) Last BM Date: 02/08/13  Intake/Output from previous day: 12/26 0701 - 12/27 0700 In: 2300 [I.V.:2300] Out: 4050 [Urine:3900; Emesis/NG output:150] Intake/Output this shift: Total I/O In: -  Out: 500 [Urine:500]  PE: Gen:  Alert, NAD, pleasant Abd: Soft, NT/ND, +BS, no HSM, midline incision with staples in place clean without signs of infection, ostomy dark purple/ischemic on left upper half but viable lower right half. Ostomy patent. Flatus in bag, but no BM, some bloody drainage noted in bag.    Lab Results:   Recent Labs  02/16/13 0512  WBC 5.9  HGB 8.7*  HCT 28.0*  PLT 199   BMET  Recent Labs  02/14/13 0040 02/16/13 0512  NA 140 139  K 3.8 3.3*  CL 105 100  CO2 26 29  GLUCOSE 108* 89  BUN 10 8  CREATININE 1.44* 1.16  CALCIUM 8.9 9.2   PT/INR No results found for this basename: LABPROT, INR,  in the last 72 hours CMP     Component Value Date/Time   NA 139 02/16/2013 0512   K 3.3* 02/16/2013 0512   CL 100 02/16/2013 0512   CO2 29 02/16/2013 0512   GLUCOSE 89 02/16/2013 0512   BUN 8 02/16/2013 0512   CREATININE 1.16 02/16/2013 0512   CALCIUM 9.2 02/16/2013 0512   PROT 6.0 02/14/2013 0040   ALBUMIN 2.5* 02/14/2013 0040   AST 7 02/14/2013 0040   ALT 6 02/14/2013 0040   ALKPHOS 72 02/14/2013 0040   BILITOT 2.1* 02/14/2013 0040   GFRNONAA 67* 02/16/2013 0512   GFRAA 77* 02/16/2013 0512   Lipase     Component Value Date/Time   LIPASE 15 11/05/2008  1215       Studies/Results: No results found.  Anti-infectives: Anti-infectives   Start     Dose/Rate Route Frequency Ordered Stop   02/12/13 2330  cefoTEtan (CEFOTAN) 1 g in dextrose 5 % 50 mL IVPB     1 g 100 mL/hr over 30 Minutes Intravenous  Once 02/12/13 1717 02/13/13 0024   02/12/13 0600  [MAR Hold]  cefoTEtan (CEFOTAN) 2 g in dextrose 5 % 50 mL IVPB     (On MAR Hold since 02/12/13 1206)   2 g 100 mL/hr over 30 Minutes Intravenous On call to O.R. 02/11/13 1706 02/12/13 1216       Assessment/Plan 1. Obstructing colon mass - Path: 1. Stomach, biopsy  - MILD CHRONIC INACTIVE GASTRITIS (ANTRAL MUCOSA)  - WARTHIN STARRY STAIN NEGATIVE FOR H. PYLORI  2. Colon, biopsy, transverse mass  - INVASIVE ADENOCARCINOMA with negative lymph node involvement S/P EXPLORATORY LAPAROTOMY PARTIAL COLECTOMY WITH COLOSTOMY (Transverse colectomy with colostomy and long Hartman's pouch) 02/12/2013, Cherylynn Ridges, MD.  3. Acute on chronic renal failure on admit (creatinine stable now 1.44)  4. Anemia with GI blood loss.  5. CAD with CM; EF 45-50% 05/2011.  6. Hx of 2 RCA stents 2010./hx of atrial tachycardia/NSVT  on BB and CCB's.  7. COPD/tobacco use Hx.  8. Hx of ETOH heavy use 20+ years, quit 2010.  9. Obesity Body mass index is 41.6.  10. Dyslipidemia  11. Deconditioning/malnutrition 12. Hypokalemia - 3.3 today  Plan:  1. Mobilize, IS, SCD and lovenox for DVT prophylaxis  2. Encourage orals and wean IV pain meds 3. Repeat labs tomorrow am  4. Continue to watch ostomy closely secondary to ischemic changes  5. Advance to clear liquids today, fulls tomorrow if tolerating, then await BM from ostomy bag prior to advancing further 6. Home when tolerating regular diet and having BM's 7. Will let medicine decide if K+ needs supplemented due to being on HD with ESRD 8.  PT/OT recommending 24 assistance HH PT, will need [facility] nursing for ostomy care    LOS: 14 days    DORT, Zuri Lascala 02/16/2013,  9:04 AM Pager: [phone]

## 2013-02-17 MED ORDER — POTASSIUM CHLORIDE 10 MEQ/100ML IV SOLN
10.0000 meq | INTRAVENOUS | Status: AC
  Administered 2013-02-17 (×4): 10 meq via INTRAVENOUS
  Filled 2013-02-17: qty 100

## 2013-02-17 MED ORDER — FUROSEMIDE 10 MG/ML IJ SOLN
40.0000 mg | Freq: Once | INTRAMUSCULAR | Status: AC
  Administered 2013-02-17: 40 mg via INTRAVENOUS
  Filled 2013-02-17: qty 4

## 2013-02-17 MED ORDER — PROMETHAZINE HCL 25 MG/ML IJ SOLN
12.5000 mg | Freq: Three times a day (TID) | INTRAMUSCULAR | Status: DC | PRN
  Administered 2013-02-17 – 2013-02-22 (×3): 25 mg via INTRAVENOUS
  Filled 2013-02-17 (×3): qty 1

## 2013-02-17 NOTE — Progress Notes (Signed)
[facility]                                                                                Patient Demographics  Larry [name], is a 62 y.o. male, DOB - 01/15/1951, JYN:[id number]  Admit date - 02/02/2013   Admitting Physician Hillary Bow, DO  Outpatient Primary MD for the patient is No primary provider on file.  LOS - 15   Chief Complaint  Patient presents with  . Weakness  . Near Syncope        Assessment & Plan   Principal Problem:   HYPERLIPIDEMIA Active Problems:   OBESITY   HYPERTENSION   CAD   CARDIOMYOPATHY, ISCHEMIC   ATRIAL TACHYCARDIA   C O P D   SLEEP APNEA, OBSTRUCTIVE   Acute renal failure   Hypotension   Chronic blood loss anemia   Atrial fibrillation   Colon cancer   Acute on chronic diastolic heart failure  Colonic mass:  -CT abd scan showed Circumferential mass involving the mid transverse colon is identified compatible with colon carcinoma. Severallow-attenuation foci are identified within the liver parenchyma  -Colonoscopy Obstructive mass on 12/19 and Bx showed: INVASIVE ADENOCARCINOMA.   -CT chest: Subpleural nodule in the right upper lobe measures 5 mm.  -Surgery consulted and colectomy and colostomy on 12/23 -Liver MRI did not showed enhancing lesions. Will need follow up with oncology as an outpatient.  -Ambulate patient -Surgery following, patient did not tolerate a clear liquid diet, continues to have nausea and vomiting.  Hx of diastolic CHF as well as ischemic CM/systolic CHF  -EF 45-50% via cath 2013  -Cardiology consulted.  -Additional dose of Lasix has been ordered by cardiology -Continue monitoring daily weights, intake and output  Paroxysmal Atrial fibrillation/SVT  -Cardiology following -Patient remains in sinus rhythm, continue IV Lopressor  Acute on Chronic Renal Failure:  -Resolved, creatinine 1.16 -Renal ultrasound showed no hydronephrosis  -May be secondary to medications versus dehydration    Hypotension  -Likely secondary to hypovolemia  -Resolved   Normocytic anemia  -H/H 8.7/28, stable -Multifactorial: GI related vs renal disease.- Patient has received 4units PRBCs this admission  -EGD showed antral erosions and duodenitis.  -Colonoscopy showed: as above.   B12 and folate deficiency  -B12 level Oct 2014 was quit low at 251 - repeated this admit 393 but given anemia, received loading dose  -Folate low so was given IV, PO held due to recent surgery  COPD  -Well compensated at present and not requiring any medication  -Has Xopenex when necessary   Hx of CAD s/p PTCA w/ stents x 2 2010  -Asymptomatic (see above)  -Cardiology following  Obesity - Body mass index is 41.46 kg/(m^2).   Nausea  -Continue zofran as needed  Code Status: Full  Family Communication: None at bedside  Disposition Plan: Admitted.    Procedures  -EGD showed antral erosions and duodenitis.   -Colonoscopy showed obstructing colon mass, transverse colon   -Echocardiogram 12/21 Left ventricle: The cavity size was mildly dilated. Wall thickness was increased in a pattern of mild LVH. Systolic function was mildly reduced. The estimated ejection fraction was in the range of 45% to 50%.   -  Colectomy and colostomy 02/12/2013  Consults   Gastroenterology Surgery Cardiology  DVT Prophylaxis heparin  Lab Results  Component Value Date   PLT 199 02/16/2013    Medications  Scheduled Meds: . aspirin  81 mg Oral Daily  . heparin subcutaneous  5,000 Units Subcutaneous Q8H  . metoprolol  5 mg Intravenous Q6H  . pantoprazole (PROTONIX) IV  40 mg Intravenous Q24H  . potassium chloride  10 mEq Intravenous Q1 Hr x 4   Continuous Infusions: . sodium chloride 75 mL/hr at 02/16/13 1056  . lactated ringers 100 mL/hr at 02/16/13 0108   PRN Meds:.HYDROcodone-acetaminophen, HYDROmorphone (DILAUDID) injection, levalbuterol, ondansetron (ZOFRAN) IV, promethazine  Antibiotics    Anti-infectives   Start     Dose/Rate Route Frequency Ordered Stop   02/12/13 2330  cefoTEtan (CEFOTAN) 1 g in dextrose 5 % 50 mL IVPB     1 g 100 mL/hr over 30 Minutes Intravenous  Once 02/12/13 1717 02/13/13 0024   02/12/13 0600  [MAR Hold]  cefoTEtan (CEFOTAN) 2 g in dextrose 5 % 50 mL IVPB     (On MAR Hold since 02/12/13 1206)   2 g 100 mL/hr over 30 Minutes Intravenous On call to O.R. 02/11/13 1706 02/12/13 1216       Time Spent in minutes   30 minutes   Raziyah Vanvleck D.O. on 02/17/2013 at 12:14 PM  Between 7am to 7pm - Pager - [phone]  After 7pm go to [email or web address] - password [email or web address]  And look for the night coverage person covering for me after hours  [facility] Office  [phone]    Subjective:   [name] seen and examined today. Patient continues to have nausea and vomiting tonight, was unable to tolerate his diet. Patient has had some abdominal distention with tenderness.  Objective:   Filed Vitals:   02/15/13 2124 02/16/13 0542 02/16/13 2208 02/17/13 0533  BP: 130/91 168/96 136/86 159/108  Pulse: 99 98 98 103  Temp: 99 F (37.2 C) 99.4 F (37.4 C) 98.3 F (36.8 C) 98.1 F (36.7 C)  TempSrc: Oral Oral Oral   Resp: 16 18 19 19   Height:      Weight:      SpO2: 99% 100% 92% 98%    Wt Readings from Last 3 Encounters:  02/12/13 135.172 kg (298 lb)  02/12/13 135.172 kg (298 lb)  02/12/13 135.172 kg (298 lb)     Intake/Output Summary (Last 24 hours) at 02/17/13 1214 Last data filed at 02/17/13 1000  Gross per 24 hour  Intake 1398.75 ml  Output   1475 ml  Net -76.25 ml    Exam  General: Well developed, well nourished, NAD, appears stated age  HEENT: NCAT, PERRLA, EOMI, Anicteic Sclera, mucous membranes moist. No pharyngeal erythema or exudates  Neck: Supple, no JVD, no masses  Cardiovascular: S1 S2 auscultated, no rubs, murmurs or gallops. Regular rate and rhythm.  Respiratory: Clear but diminished breath  sounds noted particularly at the bases.   Abdomen: Soft, diffusely tender, nondistended, + bowel sounds, ostomy bag in place, incision is clean no drainage, staples in place  Extremities: warm dry without cyanosis clubbing or edema  Neuro: AAOx3, cranial nerves grossly intact.   Skin: Without rashes exudates or nodules  Psych: Normal affect and demeanor with intact judgement and insight  Data Review   Micro Results No results found for this or any previous visit (from the past 240 hour(s)).  Radiology Reports Ct Chest W Contrast  02/08/2013  CLINICAL DATA:  Mass in transverse colon  EXAM: CT CHEST, ABDOMEN, AND PELVIS WITH CONTRAST  TECHNIQUE: Multidetector CT imaging of the chest, abdomen and pelvis was performed following the standard protocol during bolus administration of intravenous contrast.  CONTRAST:  OMNIPAQUE IOHEXOL 300 MG/ML  SOLN  COMPARISON:  100 cc of Omni 300  FINDINGS: CT CHEST FINDINGS  There is no pleural effusion identified. Subpleural nodule within the anterior right upper lobe measures 5 mm, image 33/series 3. Subpleural scar is identified in the right upper lobe, image 28/series 3.  The trachea appears patent and midline. The heart size is normal. Calcified atherosclerotic disease affects the thoracic aorta as well as the RCA, LAD and left circumflex coronary artery. No pericardial effusion noted. There is no mediastinal or hilar adenopathy. No axillary or supraclavicular adenopathy identified.  Review of the visualized bony structures is significant for mild multilevel thoracic spondylosis.  CT ABDOMEN AND PELVIS FINDINGS  There are several small, indeterminate low attenuation structures within the liver parenchyma. These are too small to characterize. Index hypodensity within the right hepatic lobe measures 6 mm, image number 58/series 2. Near the dome of liver there is a 5 mm hypodensity, image 51/series 2. Posterior right hepatic lobe hypodensity measures 5 mm,  image 62/series 2. The gallbladder appears normal. There is no biliary dilatation. Normal appearance of the pancreas. The spleen is unremarkable.  Normal appearance of the adrenal glands. Left renal cyst identified. Bilateral renal hypodensities are noted likely representing cysts. 3.8 cm diverticula arises from the right posterior bladder wall. Calcifications within the prostate gland are identified.  Calcified atherosclerotic disease affects the abdominal aorta. There is no aneurysm. No retroperitoneal adenopathy identified. No pelvic or inguinal adenopathy noted.  Normal appearance of the stomach. The small bowel loops are within normal limits circumferential mass involving the mid transverse colon is identified, image number 82/series 2. This measures approximately 6 cm in length. There is increased caliber of the proximal large bowel suggesting partial obstruction. No ascites or peritoneal nodularity identified.  Review of the visualized osseous structures is significant for mild lumbar spondylosis. No aggressive lytic or sclerotic bone lesions identified.  IMPRESSION: CT chest:  1. Circumferential mass involving the mid transverse colon is identified compatible with colon carcinoma. 2. Subpleural nodule in the right upper lobe measures 5 mm. 3. Several low-attenuation foci are identified within the liver parenchyma. These are too small to reliably characterize. In a patient with a new diagnosis of colon cancer I would suggest more definitive characterization with contrast enhanced MRI of the liver.   Electronically Signed   By: Signa Kell M.D.   On: 02/08/2013 14:46   Ct Abdomen Pelvis W Contrast  02/08/2013   CLINICAL DATA:  Mass in transverse colon  EXAM: CT CHEST, ABDOMEN, AND PELVIS WITH CONTRAST  TECHNIQUE: Multidetector CT imaging of the chest, abdomen and pelvis was performed following the standard protocol during bolus administration of intravenous contrast.  CONTRAST:  OMNIPAQUE IOHEXOL  300 MG/ML  SOLN  COMPARISON:  100 cc of Omni 300  FINDINGS: CT CHEST FINDINGS  There is no pleural effusion identified. Subpleural nodule within the anterior right upper lobe measures 5 mm, image 33/series 3. Subpleural scar is identified in the right upper lobe, image 28/series 3.  The trachea appears patent and midline. The heart size is normal. Calcified atherosclerotic disease affects the thoracic aorta as well as the RCA, LAD and left circumflex coronary artery. No pericardial effusion noted. There is no  mediastinal or hilar adenopathy. No axillary or supraclavicular adenopathy identified.  Review of the visualized bony structures is significant for mild multilevel thoracic spondylosis.  CT ABDOMEN AND PELVIS FINDINGS  There are several small, indeterminate low attenuation structures within the liver parenchyma. These are too small to characterize. Index hypodensity within the right hepatic lobe measures 6 mm, image number 58/series 2. Near the dome of liver there is a 5 mm hypodensity, image 51/series 2. Posterior right hepatic lobe hypodensity measures 5 mm, image 62/series 2. The gallbladder appears normal. There is no biliary dilatation. Normal appearance of the pancreas. The spleen is unremarkable.  Normal appearance of the adrenal glands. Left renal cyst identified. Bilateral renal hypodensities are noted likely representing cysts. 3.8 cm diverticula arises from the right posterior bladder wall. Calcifications within the prostate gland are identified.  Calcified atherosclerotic disease affects the abdominal aorta. There is no aneurysm. No retroperitoneal adenopathy identified. No pelvic or inguinal adenopathy noted.  Normal appearance of the stomach. The small bowel loops are within normal limits circumferential mass involving the mid transverse colon is identified, image number 82/series 2. This measures approximately 6 cm in length. There is increased caliber of the proximal large bowel suggesting  partial obstruction. No ascites or peritoneal nodularity identified.  Review of the visualized osseous structures is significant for mild lumbar spondylosis. No aggressive lytic or sclerotic bone lesions identified.  IMPRESSION: CT chest:  1. Circumferential mass involving the mid transverse colon is identified compatible with colon carcinoma. 2. Subpleural nodule in the right upper lobe measures 5 mm. 3. Several low-attenuation foci are identified within the liver parenchyma. These are too small to reliably characterize. In a patient with a new diagnosis of colon cancer I would suggest more definitive characterization with contrast enhanced MRI of the liver.   Electronically Signed   By: Signa Kell M.D.   On: 02/08/2013 14:46   US Renal  02/02/2013   CLINICAL DATA:  Renal failure  EXAM: RENAL/URINARY TRACT ULTRASOUND COMPLETE  COMPARISON:  None.  FINDINGS: Right Kidney:  Length: 11.6 cm. Echogenic renal parenchyma, suggesting medical renal disease. 1.4 x 1.1 x 1.2 cm upper pole cyst. No hydronephrosis.  Left Kidney:  Length: 11.7 cm. Echogenic renal parenchyma, suggesting medical renal disease. 2.1 x 2.0 x 2.0 cm upper pole cyst. No hydronephrosis.  Bladder:  Decompressed by indwelling Foley catheter.  IMPRESSION: Echogenic renal parenchyma, suggesting medical renal disease.  Bilateral renal cysts.  No hydronephrosis.   Electronically Signed   By: Charline Bills M.D.   On: 02/02/2013 20:59   Mr Liver W Contrast  02/14/2013   CLINICAL DATA:  Colonic mass, presumably colon cancer, at prior imaging, liver masses on prior CT  EXAM: MRI ABDOMEN WITHOUT CONTRAST  TECHNIQUE: Multiplanar multisequence MR imaging of the abdomen was performed after the administration of intravenous contrast.  CONTRAST:  10 cc Eovist IV contrast  COMPARISON:  CT 02/08/2013  FINDINGS: Small bilateral pleural effusions are identified. Evidence of midline abdominal incision and diverting right lower quadrant colostomy. Motion  artifact is present on multiple series. This essentially obscures detail of the previously described sub cm hepatic lesions, although no enhancement is identified at the largest of these masses measuring 6 mm within the right hepatic lobe, for example image 35 series 503 allowing for motion. On T2 weighted images, the previously seen sub cm hypodense lesions in the right hepatic lobe appear T2 hyperintense. The dome of medial segment left hepatic lobe 3 mm lesion appears T2 hyperintense  but is too small for definitive characterization on postcontrast imaging allowing for motion.  Adrenal glands, spleen, pancreas, and gallbladder are normal. No ascites. Bilateral renal cortical cysts are noted.  IMPRESSION: Hepatic T2 hyperintense lesions are identified most compatible with biliary cysts or hamartomas. Because of patient motion, only the largest of these measuring 6 mm in the posterior segment right hepatic lobe can be relatively confidently identified as lacking internal contrast enhancement. The others are too small for postcontrast evaluation due to patient motion. Further workup is as per the overall patient's periodic restaging plan.  Bilateral small pleural effusions.  Interval diverting right lower quadrant colostomy.   Electronically Signed   By: Christiana Pellant M.D.   On: 02/14/2013 09:09   Dg Chest Portable 1 View  02/02/2013   CLINICAL DATA:  Weakness, presyncope  EXAM: PORTABLE CHEST - 1 VIEW  COMPARISON:  06/16/2011  FINDINGS: Mildly low lung volumes. There is mild elevation the right hemidiaphragm. Cardiac silhouette is within normal limits. There is no evidence of focal infiltrates, effusions, nor edema. Dextroscoliosis identified within the thoracic spine.  IMPRESSION: No evidence of acute cardiopulmonary disease.   Electronically Signed   By: Salome Holmes M.D.   On: 02/02/2013 17:34    CBC  Recent Labs Lab 02/11/13 0621 02/12/13 0430 02/12/13 1958 02/13/13 0359 02/16/13 0512  WBC  6.8 6.9 9.4 8.4 5.9  HGB 7.8* 8.0* 9.4* 9.3* 8.7*  HCT 25.2* 25.8* 29.5* 30.3* 28.0*  PLT 183 196 183 192 199  MCV 90.6 90.5 90.2 90.7 89.5  MCH 28.1 28.1 28.7 27.8 27.8  MCHC 31.0 31.0 31.9 30.7 31.1  RDW 18.2* 18.2* 17.7* 17.8* 16.9*    Chemistries   Recent Labs Lab 02/11/13 0621 02/12/13 0430 02/13/13 0359 02/14/13 0040 02/16/13 0512  NA 137 141 140 140 139  K 4.0 4.1 4.3 3.8 3.3*  CL 106 106 106 105 100  CO2 22 24 25 26 29   GLUCOSE 87 92 115* 108* 89  BUN 8 8 9 10 8   CREATININE 1.48* 1.43* 1.45* 1.44* 1.16  CALCIUM 9.3 9.4 8.9 8.9 9.2  AST  --  6  --  7  --   ALT  --  9  --  6  --   ALKPHOS  --  81  --  72  --   BILITOT  --  0.5  --  2.1*  --    ------------------------------------------------------------------------------------------------------------------ estimated creatinine clearance is 94.4 ml/min (by C-G formula based on Cr of 1.16). ------------------------------------------------------------------------------------------------------------------ No results found for this basename: HGBA1C,  in the last 72 hours ------------------------------------------------------------------------------------------------------------------ No results found for this basename: CHOL, HDL, LDLCALC, TRIG, CHOLHDL, LDLDIRECT,  in the last 72 hours ------------------------------------------------------------------------------------------------------------------ No results found for this basename: TSH, T4TOTAL, FREET3, T3FREE, THYROIDAB,  in the last 72 hours ------------------------------------------------------------------------------------------------------------------ No results found for this basename: VITAMINB12, FOLATE, FERRITIN, TIBC, IRON, RETICCTPCT,  in the last 72 hours  Coagulation profile  Recent Labs Lab 02/12/13 0430  INR 1.08    No results found for this basename: DDIMER,  in the last 72 hours  Cardiac Enzymes No results found for this basename: CK, CKMB,  TROPONINI, MYOGLOBIN,  in the last 168 hours ------------------------------------------------------------------------------------------------------------------ No components found with this basename: POCBNP,

## 2013-02-17 NOTE — Progress Notes (Addendum)
     Patient ID: Larry [name], male   DOB: [birth year], 62 y.o.   MRN: [id number] Subjective:   62 yo male, hx of HTN, hyperlipidemia, amemia diastolic CHF, COPD, NSVT, CAD ( stenting of RCA) admitted with weakness, renal failure and colonic mass.  He had hemicolectomy on 12/23.  CT revealed liver mass - likely due to mets  He has been stable from a cardiac standpoint.   Objective:  Vital Signs in the last 24 hours: Temp:  [98.1 F (36.7 C)-98.3 F (36.8 C)] 98.1 F (36.7 C) (12/28 0533) Pulse Rate:  [98-103] 103 (12/28 0533) Resp:  [19] 19 (12/28 0533) BP: (136-159)/(86-108) 159/108 mmHg (12/28 0533) SpO2:  [92 %-98 %] 98 % (12/28 0533)  Intake/Output from previous day: 12/27 0701 - 12/28 0700 In: 1398.8 [I.V.:1398.8] Out: 975 [Urine:800; Emesis/NG output:50; Stool:125] Intake/Output from this shift: Total I/O In: -  Out: 225 [Urine:225]  Physical Exam: Appears somewhat uncomfortable this am HEENT: Unremarkable Neck:  Supple Lungs:  Diminished BS bases HEART:  Reg Rate, normal S1 S2  Abd:  Abdomen looks distended, few sounds Ext:  No edema Skin:  No rashes no nodules Neuro:  CN II through XII intact, motor grossly intact  Lab Results:  Recent Labs  02/16/13 0512  WBC 5.9  HGB 8.7*  PLT 199    Recent Labs  02/16/13 0512  NA 139  K 3.3*  CL 100  CO2 29  GLUCOSE 89  BUN 8  CREATININE 1.16   Hepatic Function Panel No results found for this basename: PROT, ALBUMIN, AST, ALT, ALKPHOS, BILITOT, BILIDIR, IBILI,  in the last 72 hours  Cardiac Studies: Tele - NSR at 85  Assessment/Plan:  1.  ? Colon Cancer with liver mets:  S/P abdominal surgery-management per general surgery.   2. CAD with LV dyfunction -Continue ASA;   3. SVT-Pt remains in sinus; continue IV lopressor and convert to po when able. 4. Postoperative volume excess/acute on chronic diastolic CHF-   Feels ok this am.  No further dyspnea.   Cont. meds - change to po soon ,  Will give  another dose of lasix - he's I/O + this am.  Will need to give some KCl IV  Please consider adding some KCl to his maintenance fluids.   Following   Vesta Mixer, Montez Hageman., MD, [facility] 02/17/2013, 8:12 AM Office - [phone] Pager [phone]

## 2013-02-17 NOTE — Progress Notes (Signed)
5 Days Post-Op  Subjective: Pt has significant nausea and vomiting.  Did not tolerate clears at dinner or this am.  Did tolerate yesterday's clears at lunch.  Continues to be nauseated.  Abdominal distension and tenderness.  Ostomy with flatus, but no BM yet.  Wife at bedside.  Objective: Vital signs in last 24 hours: Temp:  [98.1 F (36.7 C)-98.3 F (36.8 C)] 98.1 F (36.7 C) (12/28 0533) Pulse Rate:  [98-103] 103 (12/28 0533) Resp:  [19] 19 (12/28 0533) BP: (136-159)/(86-108) 159/108 mmHg (12/28 0533) SpO2:  [92 %-98 %] 98 % (12/28 0533) Last BM Date: 02/08/13  Intake/Output from previous day: 12/27 0701 - 12/28 0700 In: 1398.8 [I.V.:1398.8] Out: 975 [Urine:800; Emesis/NG output:50; Stool:125] Intake/Output this shift: Total I/O In: -  Out: 225 [Urine:225]  PE: Gen:  Alert, NAD, pleasant Card:  RRR, no M/G/R heard Pulm:  CTA, no W/R/R, low effort on IS today at 750 Abd: Soft, distended tender, +BS, flatus in ostomy bag, ostomy appears a little more dusky in the right lower half, still purple in upper left half, ostomy with sanguinous drainage, no BM yet,  incisions C/D/I with staples in place, mild erythema without induration in the middle of the incision   Lab Results:   Recent Labs  02/16/13 0512  WBC 5.9  HGB 8.7*  HCT 28.0*  PLT 199   BMET  Recent Labs  02/16/13 0512  NA 139  K 3.3*  CL 100  CO2 29  GLUCOSE 89  BUN 8  CREATININE 1.16  CALCIUM 9.2   PT/INR No results found for this basename: LABPROT, INR,  in the last 72 hours CMP     Component Value Date/Time   NA 139 02/16/2013 0512   K 3.3* 02/16/2013 0512   CL 100 02/16/2013 0512   CO2 29 02/16/2013 0512   GLUCOSE 89 02/16/2013 0512   BUN 8 02/16/2013 0512   CREATININE 1.16 02/16/2013 0512   CALCIUM 9.2 02/16/2013 0512   PROT 6.0 02/14/2013 0040   ALBUMIN 2.5* 02/14/2013 0040   AST 7 02/14/2013 0040   ALT 6 02/14/2013 0040   ALKPHOS 72 02/14/2013 0040   BILITOT 2.1* 02/14/2013 0040   GFRNONAA 67* 02/16/2013 0512   GFRAA 77* 02/16/2013 0512   Lipase     Component Value Date/Time   LIPASE 15 11/05/2008 1215       Studies/Results: No results found.  Anti-infectives: Anti-infectives   Start     Dose/Rate Route Frequency Ordered Stop   02/12/13 2330  cefoTEtan (CEFOTAN) 1 g in dextrose 5 % 50 mL IVPB     1 g 100 mL/hr over 30 Minutes Intravenous  Once 02/12/13 1717 02/13/13 0024   02/12/13 0600  [MAR Hold]  cefoTEtan (CEFOTAN) 2 g in dextrose 5 % 50 mL IVPB     (On MAR Hold since 02/12/13 1206)   2 g 100 mL/hr over 30 Minutes Intravenous On call to O.R. 02/11/13 1706 02/12/13 1216       Assessment/Plan 1. Obstructing colon mass  - Path: 1. Stomach, biopsy  - MILD CHRONIC INACTIVE GASTRITIS (ANTRAL MUCOSA)  - WARTHIN STARRY STAIN NEGATIVE FOR H. PYLORI  2. Colon, biopsy, transverse mass  - INVASIVE ADENOCARCINOMA with negative lymph node involvement  POD #5 S/P EXPLORATORY LAPAROTOMY PARTIAL COLECTOMY WITH COLOSTOMY (Transverse colectomy with colostomy and long Hartman's pouch) 02/12/2013, Cherylynn Ridges, MD.  3. Acute on chronic renal failure on admit (creatinine stable now 1.44)  4. Anemia with GI blood  loss.  5. CAD with CM; EF 45-50% 05/2011.  6. Hx of 2 RCA stents 2010./hx of atrial tachycardia/NSVT on BB and CCB's.  7. COPD/tobacco use Hx.  8. Hx of ETOH heavy use 20+ years, quit 2010.  9. Obesity Body mass index is 41.6.  10. Dyslipidemia  11. Deconditioning/malnutrition  12. Hypokalemia - 3.3 yesterday 13. Post-op ileus   Plan:  1. Mobilize, IS, SCD and lovenox for DVT prophylaxis  2. Encourage orals and wean IV pain meds, add phenergan for nausea 3. Repeat labs tomorrow am  4. Continue to watch ostomy closely secondary to ischemic changes, Concerned about ostomy site viability, will have surgeon come evaluate. 5. Back off to sips/chips only given nausea, likely post-op ileus, may need TPN at some point 6. Home when tolerating regular diet and  having BM's  7. Will let medicine decide if K+ needs supplemented due to being on HD with ESRD  8. PT/OT recommending 24 assistance HH PT, will need [facility] nursing for ostomy care  Discussed care with wife at bedside.      LOS: 15 days    DORT, Aundra Millet 02/17/2013, 10:12 AM Pager: [phone]

## 2013-02-17 NOTE — Progress Notes (Signed)
Pt's midline surgical wound approximated with staple closures is draining copious amounts of serous fluid. Dressing changed 3 times.  Paged surgeon on call, made aware.  Advised to change PRN throughout the night, surgical team to assess in am.  Pt to have labs drawn in am as ordered

## 2013-02-17 NOTE — Consult Note (Signed)
WOC ostomy follow up Stoma type/location: RUQ Colostomy.  Patient with no other output than yesterday-serosanguinous.  Patient nauseated most of night last night and all day today. Did not tolerate liquid diet.  Abdomen distended. No one in room. Stomal assessment/size: 1 and 3/4 inches round, slightly budded.  Still 50% red, 50% dusky. Peristomal assessment: intact, clear Treatment options for stomal/peristomal skin: None indicated Output serosanguinous Ostomy pouching: 2pc. , 2 and 1/4 inch pouching system. Previous system last for 4 days, no output however. Education provided: Patient observed me changing pouching system, but because he is still ill, did not participate.  Will require additional and continued teaching and likely [facility] post discharge. WOC nursing team will continue to follow, and will remain available to this patient, the nursing, medical and surgical teams.  Please re-consult if needed. Thanks, Ladona Mow, MSN, RN, GNP, [place], CWON-AP [phone])

## 2013-02-18 ENCOUNTER — Encounter (HOSPITAL_COMMUNITY)
Admission: EM | Disposition: A | Discharge status: Rehabilitation Facility | Payer: Self-pay | Source: Home / Self Care | Attending: Internal Medicine

## 2013-02-18 ENCOUNTER — Inpatient Hospital Stay (HOSPITAL_COMMUNITY): Payer: Medicare Other | Admitting: Certified Registered Nurse Anesthetist

## 2013-02-18 ENCOUNTER — Encounter (HOSPITAL_COMMUNITY): Payer: Medicare Other | Admitting: Certified Registered Nurse Anesthetist

## 2013-02-18 ENCOUNTER — Encounter (HOSPITAL_COMMUNITY): Payer: Self-pay | Admitting: Certified Registered Nurse Anesthetist

## 2013-02-18 ENCOUNTER — Inpatient Hospital Stay (HOSPITAL_COMMUNITY): Payer: Medicare Other

## 2013-02-18 DIAGNOSIS — R609 Edema, unspecified: Secondary | ICD-10-CM

## 2013-02-18 DIAGNOSIS — I498 Other specified cardiac arrhythmias: Secondary | ICD-10-CM

## 2013-02-18 HISTORY — PX: LAPAROTOMY: SHX154

## 2013-02-18 LAB — CBC
HCT: 30.4 % — ABNORMAL LOW (ref 39.0–52.0)
Hemoglobin: 9.1 g/dL — ABNORMAL LOW (ref 13.0–17.0)
Hemoglobin: 9.4 g/dL — ABNORMAL LOW (ref 13.0–17.0)
MCH: 28 pg (ref 26.0–34.0)
MCH: 28.1 pg (ref 26.0–34.0)
MCHC: 31.4 g/dL (ref 30.0–36.0)
MCV: 89.2 fL (ref 78.0–100.0)
MCV: 91 fL (ref 78.0–100.0)
Platelets: 194 10*3/uL (ref 150–400)
RBC: 3.25 MIL/uL — ABNORMAL LOW (ref 4.22–5.81)
RBC: 3.34 MIL/uL — ABNORMAL LOW (ref 4.22–5.81)
WBC: 8.5 10*3/uL (ref 4.0–10.5)

## 2013-02-18 LAB — BASIC METABOLIC PANEL
BUN: 7 mg/dL (ref 6–23)
CO2: 26 mEq/L (ref 19–32)
Calcium: 8.7 mg/dL (ref 8.4–10.5)
Chloride: 104 mEq/L (ref 96–112)
Creatinine, Ser: 1.28 mg/dL (ref 0.50–1.35)
GFR calc non Af Amer: 59 mL/min — ABNORMAL LOW (ref >90)
Glucose, Bld: 101 mg/dL — ABNORMAL HIGH (ref 70–99)

## 2013-02-18 LAB — CREATININE, SERUM
Creatinine, Ser: 1.51 mg/dL — ABNORMAL HIGH (ref 0.50–1.35)
GFR calc Af Amer: 56 mL/min — ABNORMAL LOW (ref >90)

## 2013-02-18 LAB — GLUCOSE, CAPILLARY: Glucose-Capillary: 94 mg/dL (ref 70–99)

## 2013-02-18 SURGERY — LAPAROTOMY, EXPLORATORY
Anesthesia: General | Site: Abdomen

## 2013-02-18 MED ORDER — FENTANYL CITRATE 0.05 MG/ML IJ SOLN
INTRAMUSCULAR | Status: DC | PRN
  Administered 2013-02-18: 100 ug via INTRAVENOUS

## 2013-02-18 MED ORDER — LIDOCAINE HCL (CARDIAC) 20 MG/ML IV SOLN
INTRAVENOUS | Status: AC
  Filled 2013-02-18: qty 5

## 2013-02-18 MED ORDER — HYDROMORPHONE HCL PF 1 MG/ML IJ SOLN: 0.2500 mg | INTRAMUSCULAR | Status: DC | PRN

## 2013-02-18 MED ORDER — LACTATED RINGERS IV SOLN
INTRAVENOUS | Status: DC
  Administered 2013-02-18: 10:00:00 via INTRAVENOUS

## 2013-02-18 MED ORDER — ROCURONIUM BROMIDE 100 MG/10ML IV SOLN
INTRAVENOUS | Status: DC | PRN
  Administered 2013-02-18: 40 mg via INTRAVENOUS

## 2013-02-18 MED ORDER — HYDROMORPHONE HCL PF 1 MG/ML IJ SOLN
1.0000 mg | INTRAMUSCULAR | Status: DC | PRN
  Administered 2013-02-19 – 2013-02-27 (×9): 1 mg via INTRAVENOUS
  Filled 2013-02-18 (×9): qty 1

## 2013-02-18 MED ORDER — ESMOLOL HCL 10 MG/ML IV SOLN
INTRAVENOUS | Status: DC | PRN
  Administered 2013-02-18: 30 ug via INTRAVENOUS

## 2013-02-18 MED ORDER — POTASSIUM CHLORIDE 10 MEQ/100ML IV SOLN
10.0000 meq | INTRAVENOUS | Status: AC
  Administered 2013-02-18 (×4): 10 meq via INTRAVENOUS
  Filled 2013-02-18 (×3): qty 100

## 2013-02-18 MED ORDER — ETOMIDATE 2 MG/ML IV SOLN
INTRAVENOUS | Status: DC | PRN
  Administered 2013-02-18: 20 mg via INTRAVENOUS

## 2013-02-18 MED ORDER — NEOSTIGMINE METHYLSULFATE 1 MG/ML IJ SOLN
INTRAMUSCULAR | Status: DC | PRN
  Administered 2013-02-18: 4 mg via INTRAVENOUS

## 2013-02-18 MED ORDER — ALBUMIN HUMAN 5 % IV SOLN
INTRAVENOUS | Status: DC | PRN
  Administered 2013-02-18: 11:00:00 via INTRAVENOUS

## 2013-02-18 MED ORDER — ESMOLOL BOLUS VIA INFUSION: 100.0000 ug/kg | Freq: Once | INTRAVENOUS | Status: DC

## 2013-02-18 MED ORDER — LABETALOL HCL 5 MG/ML IV SOLN
INTRAVENOUS | Status: AC
  Filled 2013-02-18: qty 4

## 2013-02-18 MED ORDER — ACETAMINOPHEN 650 MG RE SUPP
650.0000 mg | Freq: Four times a day (QID) | RECTAL | Status: DC | PRN
  Filled 2013-02-18: qty 1

## 2013-02-18 MED ORDER — SUCCINYLCHOLINE CHLORIDE 20 MG/ML IJ SOLN
INTRAMUSCULAR | Status: AC
  Filled 2013-02-18: qty 1

## 2013-02-18 MED ORDER — DILTIAZEM HCL 100 MG IV SOLR
5.0000 mg/h | INTRAVENOUS | Status: AC
  Administered 2013-02-18 – 2013-02-21 (×4): 5 mg/h via INTRAVENOUS
  Filled 2013-02-18 (×3): qty 100

## 2013-02-18 MED ORDER — SODIUM CHLORIDE 0.9 % IV SOLN
INTRAVENOUS | Status: DC | PRN
  Administered 2013-02-18 (×2)

## 2013-02-18 MED ORDER — ENOXAPARIN SODIUM 40 MG/0.4ML ~~LOC~~ SOLN
40.0000 mg | SUBCUTANEOUS | Status: DC
  Administered 2013-02-19: 40 mg via SUBCUTANEOUS
  Filled 2013-02-18 (×2): qty 0.4

## 2013-02-18 MED ORDER — ETOMIDATE 2 MG/ML IV SOLN
INTRAVENOUS | Status: AC
  Filled 2013-02-18: qty 20

## 2013-02-18 MED ORDER — CEFAZOLIN SODIUM-DEXTROSE 2-3 GM-% IV SOLR
INTRAVENOUS | Status: DC | PRN
  Administered 2013-02-18: 2 g via INTRAVENOUS

## 2013-02-18 MED ORDER — METOPROLOL TARTRATE 1 MG/ML IV SOLN
5.0000 mg | INTRAVENOUS | Status: AC
  Administered 2013-02-18: 5 mg via INTRAVENOUS
  Filled 2013-02-18: qty 5

## 2013-02-18 MED ORDER — OXYCODONE HCL 5 MG PO TABS: 5.0000 mg | ORAL_TABLET | Freq: Once | ORAL | Status: DC | PRN

## 2013-02-18 MED ORDER — METOPROLOL TARTRATE 1 MG/ML IV SOLN
INTRAVENOUS | Status: AC
  Administered 2013-02-18: 1 mg
  Filled 2013-02-18: qty 5

## 2013-02-18 MED ORDER — PROPOFOL 10 MG/ML IV BOLUS
INTRAVENOUS | Status: DC | PRN
  Administered 2013-02-18: 30 mg via INTRAVENOUS

## 2013-02-18 MED ORDER — FUROSEMIDE 10 MG/ML IJ SOLN
INTRAMUSCULAR | Status: AC
  Administered 2013-02-18: 10 mg
  Filled 2013-02-18: qty 4

## 2013-02-18 MED ORDER — ROCURONIUM BROMIDE 50 MG/5ML IV SOLN
INTRAVENOUS | Status: AC
  Filled 2013-02-18: qty 2

## 2013-02-18 MED ORDER — ONDANSETRON HCL 4 MG/2ML IJ SOLN: 4.0000 mg | Freq: Four times a day (QID) | INTRAMUSCULAR | Status: DC | PRN

## 2013-02-18 MED ORDER — ARTIFICIAL TEARS OP OINT
TOPICAL_OINTMENT | OPHTHALMIC | Status: DC | PRN
  Administered 2013-02-18: 1 via OPHTHALMIC

## 2013-02-18 MED ORDER — ONDANSETRON HCL 4 MG/2ML IJ SOLN
INTRAMUSCULAR | Status: DC | PRN
  Administered 2013-02-18: 4 mg via INTRAVENOUS

## 2013-02-18 MED ORDER — OXYCODONE HCL 5 MG/5ML PO SOLN: 5.0000 mg | Freq: Once | ORAL | Status: DC | PRN

## 2013-02-18 MED ORDER — LIDOCAINE HCL (CARDIAC) 20 MG/ML IV SOLN
INTRAVENOUS | Status: DC | PRN
  Administered 2013-02-18: 80 mg via INTRAVENOUS

## 2013-02-18 MED ORDER — LACTATED RINGERS IV SOLN
INTRAVENOUS | Status: DC | PRN
  Administered 2013-02-18 (×2): via INTRAVENOUS

## 2013-02-18 MED ORDER — 0.9 % SODIUM CHLORIDE (POUR BTL) OPTIME
TOPICAL | Status: DC | PRN
  Administered 2013-02-18 (×3): 1000 mL

## 2013-02-18 MED ORDER — GLYCOPYRROLATE 0.2 MG/ML IJ SOLN
INTRAMUSCULAR | Status: DC | PRN
  Administered 2013-02-18: .6 mg via INTRAVENOUS

## 2013-02-18 MED ORDER — METOPROLOL TARTRATE 1 MG/ML IV SOLN
INTRAVENOUS | Status: DC | PRN
  Administered 2013-02-18 (×3): 1 mg via INTRAVENOUS
  Administered 2013-02-18: 30 mg via INTRAVENOUS
  Administered 2013-02-18 (×2): 1 mg via INTRAVENOUS

## 2013-02-18 MED ORDER — CEFAZOLIN SODIUM-DEXTROSE 2-3 GM-% IV SOLR
INTRAVENOUS | Status: AC
  Filled 2013-02-18: qty 50

## 2013-02-18 SURGICAL SUPPLY — 49 items
BLADE SURG ROTATE 9660 (MISCELLANEOUS) IMPLANT
BNDG GAUZE ELAST 4 BULKY (GAUZE/BANDAGES/DRESSINGS) ×2 IMPLANT
CANISTER SUCTION 2500CC (MISCELLANEOUS) ×2 IMPLANT
CHLORAPREP W/TINT 26ML (MISCELLANEOUS) ×2 IMPLANT
COVER MAYO STAND STRL (DRAPES) IMPLANT
COVER SURGICAL LIGHT HANDLE (MISCELLANEOUS) ×2 IMPLANT
DRAPE LAPAROSCOPIC ABDOMINAL (DRAPES) ×2 IMPLANT
DRAPE PROXIMA HALF (DRAPES) IMPLANT
DRAPE UTILITY 15X26 W/TAPE STR (DRAPE) ×4 IMPLANT
DRAPE WARM FLUID 44X44 (DRAPE) ×2 IMPLANT
DRSG OPSITE POSTOP 4X10 (GAUZE/BANDAGES/DRESSINGS) IMPLANT
DRSG OPSITE POSTOP 4X8 (GAUZE/BANDAGES/DRESSINGS) IMPLANT
DRSG PAD ABDOMINAL 8X10 ST (GAUZE/BANDAGES/DRESSINGS) ×2 IMPLANT
DRSG TEGADERM 4X4.75 (GAUZE/BANDAGES/DRESSINGS) ×4 IMPLANT
ELECT BLADE 6.5 EXT (BLADE) IMPLANT
ELECT CAUTERY BLADE 6.4 (BLADE) ×4 IMPLANT
ELECT REM PT RETURN 9FT ADLT (ELECTROSURGICAL) ×2
ELECTRODE REM PT RTRN 9FT ADLT (ELECTROSURGICAL) ×1 IMPLANT
GLOVE BIO SURGEON STRL SZ 6.5 (GLOVE) ×2 IMPLANT
GLOVE BIO SURGEON STRL SZ7.5 (GLOVE) ×6 IMPLANT
GLOVE BIOGEL PI IND STRL 7.0 (GLOVE) ×1 IMPLANT
GLOVE BIOGEL PI IND STRL 8 (GLOVE) ×1 IMPLANT
GLOVE BIOGEL PI INDICATOR 7.0 (GLOVE) ×1
GLOVE BIOGEL PI INDICATOR 8 (GLOVE) ×1
GLOVE SURG SS PI 6.5 STRL IVOR (GLOVE) ×2 IMPLANT
GOWN STRL NON-REIN LRG LVL3 (GOWN DISPOSABLE) ×4 IMPLANT
GOWN STRL REIN XL XLG (GOWN DISPOSABLE) ×2 IMPLANT
KIT BASIN OR (CUSTOM PROCEDURE TRAY) ×2 IMPLANT
KIT OSTOMY DRAINABLE 2.75 STR (WOUND CARE) ×2 IMPLANT
KIT ROOM TURNOVER OR (KITS) ×2 IMPLANT
LIGASURE IMPACT 36 18CM CVD LR (INSTRUMENTS) IMPLANT
NS IRRIG 1000ML POUR BTL (IV SOLUTION) ×4 IMPLANT
PACK GENERAL/GYN (CUSTOM PROCEDURE TRAY) ×2 IMPLANT
PAD ARMBOARD 7.5X6 YLW CONV (MISCELLANEOUS) ×4 IMPLANT
PENCIL BUTTON HOLSTER BLD 10FT (ELECTRODE) IMPLANT
SPECIMEN JAR LARGE (MISCELLANEOUS) IMPLANT
SPONGE GAUZE 4X4 12PLY (GAUZE/BANDAGES/DRESSINGS) ×2 IMPLANT
SPONGE LAP 18X18 X RAY DECT (DISPOSABLE) IMPLANT
STAPLER VISISTAT 35W (STAPLE) ×2 IMPLANT
SUCTION POOLE TIP (SUCTIONS) ×2 IMPLANT
SUT PDS AB 1 TP1 96 (SUTURE) ×4 IMPLANT
SUT SILK 2 0 SH CR/8 (SUTURE) ×2 IMPLANT
SUT SILK 2 0 TIES 10X30 (SUTURE) IMPLANT
SUT SILK 3 0 SH CR/8 (SUTURE) ×2 IMPLANT
SUT SILK 3 0 TIES 10X30 (SUTURE) IMPLANT
TOWEL OR 17X26 10 PK STRL BLUE (TOWEL DISPOSABLE) ×2 IMPLANT
TRAY FOLEY CATH 16FRSI W/METER (SET/KITS/TRAYS/PACK) IMPLANT
TUBE CONNECTING 12X1/4 (SUCTIONS) IMPLANT
YANKAUER SUCT BULB TIP NO VENT (SUCTIONS) IMPLANT

## 2013-02-18 NOTE — Procedures (Signed)
Extubation Procedure Note  Patient Details:   Name: Larry [name] DOB: Apr 13, 1950 MRN: [id number]   Airway Documentation:   Pt able to lift head off pillow x 5 seconds, pt follows commands.  Dr. Chaney Malling was at bedside and extubated pt.    Evaluation  O2 sats: stable throughout Complications: No apparent complications Patient did tolerate procedure well. Bilateral Breath Sounds: Diminished;Clear Suctioning: Airway;Oral Yes, pt able to speak.    Pt extubated to simple mask, sats dropped to 85-86% after a couple of minutes being extubated, Pt was placed on NRB mask, sats increased to 95-99%.  RN aware and MD remained at bedside for several minutes post extubation to eval pt.      Jennette Kettle 02/18/2013, 12:24 PM

## 2013-02-18 NOTE — Anesthesia Procedure Notes (Signed)
Procedure Name: Intubation Date/Time: 02/18/2013 10:19 AM Performed by: Angelica Pou Pre-anesthesia Checklist: Patient identified, Patient being monitored, Emergency Drugs available, Timeout performed and Suction available Patient Re-evaluated:Patient Re-evaluated prior to inductionOxygen Delivery Method: Circle system utilized Preoxygenation: Pre-oxygenation with 100% oxygen Intubation Type: IV induction, Rapid sequence and Cricoid Pressure applied Laryngoscope size: glidescope blade #5. Grade View: Grade I Tube type: Subglottic suction tube Tube size: 7.5 mm Number of attempts: 1 Airway Equipment and Method: Video-laryngoscopy and Rigid stylet Placement Confirmation: ETT inserted through vocal cords under direct vision,  breath sounds checked- equal and bilateral and positive ETCO2 Secured at: 24 cm Tube secured with: Tape Dental Injury: Teeth and Oropharynx as per pre-operative assessment

## 2013-02-18 NOTE — Progress Notes (Signed)
Dr. Chaney Malling made aware of continued HR in 150 range.Bp drifting down to 101-108/62. Pt resting relaxed position . Easily awakened with light touch or voice . Results of PCXR seen by Dr. Chaney Malling.  Esmolol ordered. Dr. Derrell Lolling updated per phone and spoke with DR. Hodierne. Will request Stepdown bed and replace NG tube that was dced when ETT dced

## 2013-02-18 NOTE — Op Note (Signed)
02/02/2013 - 02/18/2013  11:29 AM  PATIENT:  Larry [name]  62 y.o. male  PRE-OPERATIVE DIAGNOSIS:  Wound Dehissence  POST-OPERATIVE DIAGNOSIS:  Wound Dehissence  PROCEDURE:  Procedure(s): EXPLORATORY LAPAROTOMY, Abd washout, Closure of Fascia(N/A)  SURGEON:  Surgeon(s) and Role:    * Axel Filler, MD - Primary  PHYSICIAN ASSISTANT:   ASSISTANTS: none   ANESTHESIA:   general  EBL:  Total I/O In: 250 [IV Piggyback:250] Out: 600 [Urine:350; Other:250]  BLOOD ADMINISTERED:none  DRAINS: none   LOCAL MEDICATIONS USED:  NONE  SPECIMEN:  No Specimen  DISPOSITION OF SPECIMEN:  N/A  COUNTS:  YES  TOURNIQUET:  * No tourniquets in log *  DICTATION: .Dragon Dictation The patient was taken back to the operating room after informed consent was obtained and prepped and draped in the usual sterile fashion. After appropriate by confirm a timeout was called and all facts were verified.    I initially began by investigating the ostomy site prior to the patient being prepped and draped. The mucosa was dusky however the deeper portion of the ostomy was pink with ostomy output.  I began by bluntly cleared away the omentum from the anterior fascia. It was clearly seen that there was a fascia dehiscence as the omentum was exposed.  The fascia in the midportion of the wound was dehisced. I removed the PDS stitches that were in place down to the  Inferior and superior apex of the wound. I washed out the hematoma as well as the abdomen with sterile saline.  There is a small amount of ischemic perineum that was debrided. No fascial was debrided, and did not look infected.  I began closure the fascia superiorly using a #1 PDS single-stranded in a figure-of-eight fashion. Figure-of-eight stitches were then used to reapproximate the fascia throughout the wound. The fat was irrigated out with sterile saline. The area was packed with Kerlix, polar course, ABDs pad and tape. The ostomy  appliance was replaced on the ostomy site. The patient was taken to recovery in stable condition.  PLAN OF CARE: Already an inpatient  PATIENT DISPOSITION:  PACU - hemodynamically stable.   Delay start of Pharmacological VTE agent (>24hrs) due to surgical blood loss or risk of bleeding: no

## 2013-02-18 NOTE — Progress Notes (Signed)
Pt congested c/o hard to breath pt placed on cpap settings will be charted by sarah resp therapist dr Denton Ar at bedside

## 2013-02-18 NOTE — Progress Notes (Signed)
Physical Therapy Note  Pt currently in OR 2/2 abdominal wound dehiscence and ileus.  Will try back another time.    [address] Quillan Whitter, [place] [phone]

## 2013-02-18 NOTE — Progress Notes (Signed)
Talbert Forest Clemence Lengyel rn has charted under Larry walker rn since arrival to pacu reported to [employer]

## 2013-02-18 NOTE — Anesthesia Preprocedure Evaluation (Addendum)
Anesthesia Evaluation  Patient identified by MRN, date of birth, ID band Patient awake    Reviewed: Allergy & Precautions, H&P , NPO status , Patient's Chart, lab work & pertinent test results, reviewed documented beta blocker date and time   History of Anesthesia Complications Negative for: history of anesthetic complications  Airway Mallampati: III TM Distance: >3 FB Neck ROM: Full    Dental  (+) Teeth Intact and Dental Advisory Given   Pulmonary asthma , sleep apnea , COPDformer smoker,  2L O2 on nursing unit 6N   Pulmonary exam normal       Cardiovascular hypertension, Pt. on medications and Pt. on home beta blockers + CAD, + Cardiac Stents and +CHF + dysrhythmias Supra Ventricular Tachycardia Rate:Tachycardia  CAD with CM; EF 45-50% 05/2011.   Hx of 2 RCA stents 2010. Hx of atrial tachycardia/NSVT on BB and CCB's.  HR currently 140's.   Neuro/Psych    GI/Hepatic S/p ex-lap for colon CA.  Now with fluid draining from abdominal wound.   Endo/Other  Morbid obesity  Renal/GU Renal Insufficiency and ARFRenal disease     Musculoskeletal   Abdominal   Peds  Hematology  (+) Blood dyscrasia, anemia ,   Anesthesia Other Findings   Reproductive/Obstetrics                         Anesthesia Physical Anesthesia Plan  ASA: III  Anesthesia Plan: General   Post-op Pain Management:    Induction: Intravenous  Airway Management Planned: Oral ETT  Additional Equipment:   Intra-op Plan:   Post-operative Plan: Possible Post-op intubation/ventilation  Informed Consent: I have reviewed the patients History and Physical, chart, labs and discussed the procedure including the risks, benefits and alternatives for the proposed anesthesia with the patient or authorized representative who has indicated his/her understanding and acceptance.     Plan Discussed with: CRNA, Anesthesiologist and  Surgeon  Anesthesia Plan Comments:         Anesthesia Quick Evaluation

## 2013-02-18 NOTE — Progress Notes (Signed)
Day of Surgery  Subjective: Pt still feeling bad today, continues to have dry heaves, some intermittent vomiting, nausea.  Abdomen is still distended; however, his ostomy has been putting out some stool and a lot of flatus.  Wasn't able to ambulate any yesterday due to pain.    Objective: Vital signs in last 24 hours: Temp:  [98.9 F (37.2 C)-99.4 F (37.4 C)] 98.9 F (37.2 C) (12/29 0530) Pulse Rate:  [95-101] 97 (12/29 0530) Resp:  [16-18] 16 (12/29 0530) BP: (135-142)/(84-96) 136/92 mmHg (12/29 0530) SpO2:  [96 %-97 %] 97 % (12/29 0530) Last BM Date: 02/17/13  Intake/Output from previous day: 12/28 0701 - 12/29 0700 In: 700 [I.V.:300; IV Piggyback:400] Out: 2930 [Urine:2275; Emesis/NG output:300; Stool:355] Intake/Output this shift:    PE: Gen:  Alert, NAD, pleasant Abd: Soft, NT/ND, +BS, no HSM, incisions C/D/I, drain with minimal sanguinous drainage, no abdominal scars noted   Lab Results:   Recent Labs  02/16/13 0512 02/18/13 0347  WBC 5.9 6.3  HGB 8.7* 9.1*  HCT 28.0* 29.0*  PLT 199 208   BMET  Recent Labs  02/16/13 0512 02/18/13 0347  NA 139 139  K 3.3* 3.4*  CL 100 104  CO2 29 26  GLUCOSE 89 101*  BUN 8 7  CREATININE 1.16 1.28  CALCIUM 9.2 8.7   PT/INR No results found for this basename: LABPROT, INR,  in the last 72 hours CMP     Component Value Date/Time   NA 139 02/18/2013 0347   K 3.4* 02/18/2013 0347   CL 104 02/18/2013 0347   CO2 26 02/18/2013 0347   GLUCOSE 101* 02/18/2013 0347   BUN 7 02/18/2013 0347   CREATININE 1.28 02/18/2013 0347   CALCIUM 8.7 02/18/2013 0347   PROT 6.0 02/14/2013 0040   ALBUMIN 2.5* 02/14/2013 0040   AST 7 02/14/2013 0040   ALT 6 02/14/2013 0040   ALKPHOS 72 02/14/2013 0040   BILITOT 2.1* 02/14/2013 0040   GFRNONAA 59* 02/18/2013 0347   GFRAA 69* 02/18/2013 0347   Lipase     Component Value Date/Time   LIPASE 15 11/05/2008 1215       Studies/Results: No results  found.  Anti-infectives: Anti-infectives   Start     Dose/Rate Route Frequency Ordered Stop   02/12/13 2330  cefoTEtan (CEFOTAN) 1 g in dextrose 5 % 50 mL IVPB     1 g 100 mL/hr over 30 Minutes Intravenous  Once 02/12/13 1717 02/13/13 0024   02/12/13 0600  [MAR Hold]  cefoTEtan (CEFOTAN) 2 g in dextrose 5 % 50 mL IVPB     (On MAR Hold since 02/12/13 1206)   2 g 100 mL/hr over 30 Minutes Intravenous On call to O.R. 02/11/13 1706 02/12/13 1216       Assessment/Plan 1. Obstructing colon mass  - Path: 1. Stomach, biopsy  - MILD CHRONIC INACTIVE GASTRITIS (ANTRAL MUCOSA)  - WARTHIN STARRY STAIN NEGATIVE FOR H. PYLORI  2. Colon, biopsy, transverse mass  - INVASIVE ADENOCARCINOMA with negative lymph node involvement  POD #6 S/P EXPLORATORY LAPAROTOMY PARTIAL COLECTOMY WITH COLOSTOMY (Transverse colectomy with colostomy and long Hartman's pouch) 02/12/2013, Cherylynn Ridges, MD.  3. Acute on chronic renal failure on admit (creatinine now 1.28)  4. Anemia with GI blood loss.  5. CAD with CM; EF 45-50% 05/2011.  6. Hx of 2 RCA stents 2010./hx of atrial tachycardia/NSVT on BB and CCB's.  7. COPD/tobacco use Hx.  8. Hx of ETOH heavy use 20+ years, quit  2010.  9. Obesity Body mass index is 41.6.  10. Dyslipidemia  11. Deconditioning/malnutrition  12. Hypokalemia - 3.3 yesterday  13. Abdominal fascial dehiscence   Plan:  1. The patient continues to be distended and over night he has started to have significant serous and now serosanguinous drainage from the middle of his abdominal incision.  The dressing had to be changed 3 times overnight.  We removed 4 of the staples and there is omentum visible and evidence of a fascial dehiscence.  We have ordered an abdominal binder.  He will be going to the OR immediately for Ex lap, wash out, possible ostomy revision, and hopefully fascial closure. 2.  I have discussed with the patient and his wife on the phone who will arrive shortly.   3.  Lovenox was  given at 6am, but aspirin will be held. 4.  The patient is NPO, consent form, zosyn ordered     LOS: 16 days    DORT, Amilyah Nack 02/18/2013, 9:04 AM Pager: [phone]

## 2013-02-18 NOTE — Progress Notes (Signed)
I have seen and examined the pt and agree with PA-Dort's progress note. Pt with abd fascial dehis  On exam. Pt with sign of ileus likely secondary to above. Will take back to OR for Ex lap and washout and closure of fascia.

## 2013-02-18 NOTE — Anesthesia Postprocedure Evaluation (Signed)
  Anesthesia Post-op Note  Patient: Larry [name]  Procedure(s) Performed: Procedure(s): EXPLORATORY LAPAROTOMY/Closure of Wound (N/A)  Patient Location: PACU  Anesthesia Type:General  Level of Consciousness: awake, alert , oriented and patient cooperative  Airway and Oxygen Therapy: Patient Spontanous Breathing and utilizing CPAP.  Respiratory efforts were suboptimal without assistance.  Post-op Pain: none  Post-op Assessment: Post-op Vital signs reviewed  Post-op Vital Signs: Reviewed and unstable  Complications: No apparent anesthesia complications and Pt continues with SVT ranging from 120-155.  BP remains stable.  Minimal response to beta blocker treatment in PACU.  Consulting cardiology, who is already following the patient for SVT.  Plan to transfer to step down unit.

## 2013-02-18 NOTE — Progress Notes (Signed)
[facility]                                                                                Patient Demographics  Larry [name], is a 62 y.o. male, DOB - 11-23-1950, ZOX:[id number]  Admit date - 02/02/2013   Admitting Physician Larry Bow, DO  Outpatient Primary MD for the patient is No primary provider on file.  LOS - 16   Chief Complaint  Patient presents with  . Weakness  . Near Syncope        Assessment & Plan   Principal Problem:   HYPERLIPIDEMIA Active Problems:   OBESITY   HYPERTENSION   CAD   CARDIOMYOPATHY, ISCHEMIC   ATRIAL TACHYCARDIA   C O P D   SLEEP APNEA, OBSTRUCTIVE   Acute renal failure   Hypotension   Chronic blood loss anemia   Atrial fibrillation   Colon cancer   Acute on chronic diastolic heart failure  Colonic mass:  -CT abd scan showed Circumferential mass involving the mid transverse colon is identified compatible with colon carcinoma. Severallow-attenuation foci are identified within the liver parenchyma  -Colonoscopy Obstructive mass on 12/19 and Bx showed: INVASIVE ADENOCARCINOMA.   -CT chest: Subpleural nodule in the right upper lobe measures 5 mm.  -Surgery consulted and colectomy and colostomy on 12/23 -Liver MRI did not showed enhancing lesions. Will need follow up with oncology as an outpatient.  -Ambulate patient -Surgery following, patient did not tolerate a clear liquid diet, continues to have nausea and vomiting. -Patient has abdominal fascial dehiscence  Hx of diastolic CHF as well as ischemic CM/systolic CHF  -EF 45-50% via cath 2013  -Cardiology consulted.  -Additional dose of Lasix has been ordered by cardiology -Continue monitoring daily weights, intake and output  Paroxysmal Atrial fibrillation/SVT  -Cardiology following -Patient remains in sinus rhythm, continue IV Lopressor  Acute on Chronic Renal Failure:  -Resolved, creatinine 1.28 -Renal ultrasound showed no hydronephrosis  -May be secondary to  medications versus dehydration   Hypotension  -Likely secondary to hypovolemia  -Resolved   Normocytic anemia  -H/H 9.1/29, stable -Multifactorial: GI related vs renal disease.- Patient has received 4units PRBCs this admission  -EGD showed antral erosions and duodenitis.  -Colonoscopy showed: as above.   B12 and folate deficiency  -B12 level Oct 2014 was quit low at 251 - repeated this admit 393 but given anemia, received loading dose  -Folate low so was given IV, PO held due to recent surgery  COPD  -Well compensated at present and not requiring any medication  -Has Xopenex when necessary   Hx of CAD s/p PTCA w/ stents x 2 2010  -Asymptomatic (see above)  -Cardiology following  Obesity - Body mass index is 41.46 kg/(m^2).   Nausea  -Continue zofran as needed  Code Status: Full  Family Communication: None at bedside  Disposition Plan: Admitted.    Procedures  -EGD showed antral erosions and duodenitis.   -Colonoscopy showed obstructing colon mass, transverse colon   -Echocardiogram 12/21 Left ventricle: The cavity size was mildly dilated. Wall thickness was increased in a pattern of mild LVH. Systolic function was mildly reduced. The estimated ejection fraction was in the  range of 45% to 50%.   -Colectomy and colostomy 02/12/2013  Consults   Gastroenterology Surgery Cardiology  DVT Prophylaxis heparin  Lab Results  Component Value Date   PLT 208 02/18/2013    Medications  Scheduled Meds: . etomidate      . lidocaine (cardiac) 100 mg/81ml      . [facility] HOLD] metoprolol  5 mg Intravenous Q6H  . [MAR HOLD] pantoprazole (PROTONIX) IV  40 mg Intravenous Q24H  . potassium chloride  10 mEq Intravenous Q1 Hr x 4  . rocuronium      . succinylcholine       Continuous Infusions: . sodium chloride 75 mL/hr at 02/17/13 2211  . lactated ringers 100 mL/hr at 02/16/13 0108  . lactated ringers 20 mL/hr at 02/18/13 0958   PRN Meds:.[facility] HOLD]  HYDROcodone-acetaminophen, HYDROmorphone (DILAUDID) injection, [MAR HOLD]  HYDROmorphone (DILAUDID) injection, [MAR HOLD] levalbuterol, [MAR HOLD] ondansetron (ZOFRAN) IV, ondansetron (ZOFRAN) IV, oxyCODONE, oxyCODONE, [MAR HOLD] promethazine  Antibiotics   Anti-infectives   Start     Dose/Rate Route Frequency Ordered Stop   02/18/13 1057  ANCEF 1 gram in 0.9% normal saline 500 mL  Status:  Discontinued       As needed 02/18/13 1057 02/18/13 1152   02/12/13 2330  cefoTEtan (CEFOTAN) 1 g in dextrose 5 % 50 mL IVPB     1 g 100 mL/hr over 30 Minutes Intravenous  Once 02/12/13 1717 02/13/13 0024   02/12/13 0600  [MAR Hold]  cefoTEtan (CEFOTAN) 2 g in dextrose 5 % 50 mL IVPB     (On MAR Hold since 02/12/13 1206)   2 g 100 mL/hr over 30 Minutes Intravenous On call to O.R. 02/11/13 1706 02/12/13 1216       Time Spent in minutes   30 minutes   Larry [name] D.O. on 02/18/2013 at 1:12 PM  Between 7am to 7pm - Pager - [phone]  After 7pm go to [email or web address] - password [email or web address]  And look for the night coverage person covering for me after hours  [facility] Office  [phone]    Subjective:   Larry [name] seen and examined today. Patient continues to have nausea and vomiting tonight, was unable to tolerate his diet. Patient has had some abdominal distention with tenderness.  Objective:   Filed Vitals:   02/18/13 1200 02/18/13 1210 02/18/13 1215 02/18/13 1230  BP: 130/119  167/112 132/96  Pulse: 140 124    Temp: 97.7 F (36.5 C)     TempSrc:      Resp: 23 20  24   Height:      Weight:      SpO2: 100% 96%  98%    Wt Readings from Last 3 Encounters:  02/12/13 135.172 kg (298 lb)  02/12/13 135.172 kg (298 lb)  02/12/13 135.172 kg (298 lb)     Intake/Output Summary (Last 24 hours) at 02/18/13 1312 Last data filed at 02/18/13 1200  Gross per 24 hour  Intake   1450 ml  Output   2130 ml  Net   -680 ml    Exam  General: Well developed, well  nourished, NAD, appears stated age  HEENT: NCAT, PERRLA, EOMI, Anicteic Sclera, mucous membranes moist. No pharyngeal erythema or exudates  Neck: Supple, no JVD, no masses  Cardiovascular: S1 S2 auscultated, no rubs, murmurs or gallops. Regular rate and rhythm.  Respiratory: Clear but diminished breath sounds noted particularly at the bases.   Abdomen: Soft, diffusely tender, nondistended, + bowel sounds, ostomy  bag in place, incision is clean, sanguinous drainage noted  Extremities: warm dry without cyanosis clubbing or edema  Neuro: AAOx3, cranial nerves grossly intact.   Skin: Without rashes exudates or nodules  Psych: Normal affect and demeanor with intact judgement and insight  Data Review   Micro Results No results found for this or any previous visit (from the past 240 hour(s)).  Radiology Reports Ct Chest W Contrast  02/08/2013   CLINICAL DATA:  Mass in transverse colon  EXAM: CT CHEST, ABDOMEN, AND PELVIS WITH CONTRAST  TECHNIQUE: Multidetector CT imaging of the chest, abdomen and pelvis was performed following the standard protocol during bolus administration of intravenous contrast.  CONTRAST:  OMNIPAQUE IOHEXOL 300 MG/ML  SOLN  COMPARISON:  100 cc of Omni 300  FINDINGS: CT CHEST FINDINGS  There is no pleural effusion identified. Subpleural nodule within the anterior right upper lobe measures 5 mm, image 33/series 3. Subpleural scar is identified in the right upper lobe, image 28/series 3.  The trachea appears patent and midline. The heart size is normal. Calcified atherosclerotic disease affects the thoracic aorta as well as the RCA, LAD and left circumflex coronary artery. No pericardial effusion noted. There is no mediastinal or hilar adenopathy. No axillary or supraclavicular adenopathy identified.  Review of the visualized bony structures is significant for mild multilevel thoracic spondylosis.  CT ABDOMEN AND PELVIS FINDINGS  There are several small, indeterminate  low attenuation structures within the liver parenchyma. These are too small to characterize. Index hypodensity within the right hepatic lobe measures 6 mm, image number 58/series 2. Near the dome of liver there is a 5 mm hypodensity, image 51/series 2. Posterior right hepatic lobe hypodensity measures 5 mm, image 62/series 2. The gallbladder appears normal. There is no biliary dilatation. Normal appearance of the pancreas. The spleen is unremarkable.  Normal appearance of the adrenal glands. Left renal cyst identified. Bilateral renal hypodensities are noted likely representing cysts. 3.8 cm diverticula arises from the right posterior bladder wall. Calcifications within the prostate gland are identified.  Calcified atherosclerotic disease affects the abdominal aorta. There is no aneurysm. No retroperitoneal adenopathy identified. No pelvic or inguinal adenopathy noted.  Normal appearance of the stomach. The small bowel loops are within normal limits circumferential mass involving the mid transverse colon is identified, image number 82/series 2. This measures approximately 6 cm in length. There is increased caliber of the proximal large bowel suggesting partial obstruction. No ascites or peritoneal nodularity identified.  Review of the visualized osseous structures is significant for mild lumbar spondylosis. No aggressive lytic or sclerotic bone lesions identified.  IMPRESSION: CT chest:  1. Circumferential mass involving the mid transverse colon is identified compatible with colon carcinoma. 2. Subpleural nodule in the right upper lobe measures 5 mm. 3. Several low-attenuation foci are identified within the liver parenchyma. These are too small to reliably characterize. In a patient with a new diagnosis of colon cancer I would suggest more definitive characterization with contrast enhanced MRI of the liver.   Electronically Signed   By: Signa Kell M.D.   On: 02/08/2013 14:46   Ct Abdomen Pelvis W  Contrast  02/08/2013   CLINICAL DATA:  Mass in transverse colon  EXAM: CT CHEST, ABDOMEN, AND PELVIS WITH CONTRAST  TECHNIQUE: Multidetector CT imaging of the chest, abdomen and pelvis was performed following the standard protocol during bolus administration of intravenous contrast.  CONTRAST:  OMNIPAQUE IOHEXOL 300 MG/ML  SOLN  COMPARISON:  100 cc of Omni  300  FINDINGS: CT CHEST FINDINGS  There is no pleural effusion identified. Subpleural nodule within the anterior right upper lobe measures 5 mm, image 33/series 3. Subpleural scar is identified in the right upper lobe, image 28/series 3.  The trachea appears patent and midline. The heart size is normal. Calcified atherosclerotic disease affects the thoracic aorta as well as the RCA, LAD and left circumflex coronary artery. No pericardial effusion noted. There is no mediastinal or hilar adenopathy. No axillary or supraclavicular adenopathy identified.  Review of the visualized bony structures is significant for mild multilevel thoracic spondylosis.  CT ABDOMEN AND PELVIS FINDINGS  There are several small, indeterminate low attenuation structures within the liver parenchyma. These are too small to characterize. Index hypodensity within the right hepatic lobe measures 6 mm, image number 58/series 2. Near the dome of liver there is a 5 mm hypodensity, image 51/series 2. Posterior right hepatic lobe hypodensity measures 5 mm, image 62/series 2. The gallbladder appears normal. There is no biliary dilatation. Normal appearance of the pancreas. The spleen is unremarkable.  Normal appearance of the adrenal glands. Left renal cyst identified. Bilateral renal hypodensities are noted likely representing cysts. 3.8 cm diverticula arises from the right posterior bladder wall. Calcifications within the prostate gland are identified.  Calcified atherosclerotic disease affects the abdominal aorta. There is no aneurysm. No retroperitoneal adenopathy identified. No pelvic or  inguinal adenopathy noted.  Normal appearance of the stomach. The small bowel loops are within normal limits circumferential mass involving the mid transverse colon is identified, image number 82/series 2. This measures approximately 6 cm in length. There is increased caliber of the proximal large bowel suggesting partial obstruction. No ascites or peritoneal nodularity identified.  Review of the visualized osseous structures is significant for mild lumbar spondylosis. No aggressive lytic or sclerotic bone lesions identified.  IMPRESSION: CT chest:  1. Circumferential mass involving the mid transverse colon is identified compatible with colon carcinoma. 2. Subpleural nodule in the right upper lobe measures 5 mm. 3. Several low-attenuation foci are identified within the liver parenchyma. These are too small to reliably characterize. In a patient with a new diagnosis of colon cancer I would suggest more definitive characterization with contrast enhanced MRI of the liver.   Electronically Signed   By: Signa Kell M.D.   On: 02/08/2013 14:46   US Renal  02/02/2013   CLINICAL DATA:  Renal failure  EXAM: RENAL/URINARY TRACT ULTRASOUND COMPLETE  COMPARISON:  None.  FINDINGS: Right Kidney:  Length: 11.6 cm. Echogenic renal parenchyma, suggesting medical renal disease. 1.4 x 1.1 x 1.2 cm upper pole cyst. No hydronephrosis.  Left Kidney:  Length: 11.7 cm. Echogenic renal parenchyma, suggesting medical renal disease. 2.1 x 2.0 x 2.0 cm upper pole cyst. No hydronephrosis.  Bladder:  Decompressed by indwelling Foley catheter.  IMPRESSION: Echogenic renal parenchyma, suggesting medical renal disease.  Bilateral renal cysts.  No hydronephrosis.   Electronically Signed   By: Charline Bills M.D.   On: 02/02/2013 20:59   Mr Liver W Contrast  02/14/2013   CLINICAL DATA:  Colonic mass, presumably colon cancer, at prior imaging, liver masses on prior CT  EXAM: MRI ABDOMEN WITHOUT CONTRAST  TECHNIQUE: Multiplanar  multisequence MR imaging of the abdomen was performed after the administration of intravenous contrast.  CONTRAST:  10 cc Eovist IV contrast  COMPARISON:  CT 02/08/2013  FINDINGS: Small bilateral pleural effusions are identified. Evidence of midline abdominal incision and diverting right lower quadrant colostomy. Motion artifact is present  on multiple series. This essentially obscures detail of the previously described sub cm hepatic lesions, although no enhancement is identified at the largest of these masses measuring 6 mm within the right hepatic lobe, for example image 35 series 503 allowing for motion. On T2 weighted images, the previously seen sub cm hypodense lesions in the right hepatic lobe appear T2 hyperintense. The dome of medial segment left hepatic lobe 3 mm lesion appears T2 hyperintense but is too small for definitive characterization on postcontrast imaging allowing for motion.  Adrenal glands, spleen, pancreas, and gallbladder are normal. No ascites. Bilateral renal cortical cysts are noted.  IMPRESSION: Hepatic T2 hyperintense lesions are identified most compatible with biliary cysts or hamartomas. Because of patient motion, only the largest of these measuring 6 mm in the posterior segment right hepatic lobe can be relatively confidently identified as lacking internal contrast enhancement. The others are too small for postcontrast evaluation due to patient motion. Further workup is as per the overall patient's periodic restaging plan.  Bilateral small pleural effusions.  Interval diverting right lower quadrant colostomy.   Electronically Signed   By: Christiana Pellant M.D.   On: 02/14/2013 09:09   Dg Chest Portable 1 View  02/02/2013   CLINICAL DATA:  Weakness, presyncope  EXAM: PORTABLE CHEST - 1 VIEW  COMPARISON:  06/16/2011  FINDINGS: Mildly low lung volumes. There is mild elevation the right hemidiaphragm. Cardiac silhouette is within normal limits. There is no evidence of focal infiltrates,  effusions, nor edema. Dextroscoliosis identified within the thoracic spine.  IMPRESSION: No evidence of acute cardiopulmonary disease.   Electronically Signed   By: Salome Holmes M.D.   On: 02/02/2013 17:34    CBC  Recent Labs Lab 02/12/13 0430 02/12/13 1958 02/13/13 0359 02/16/13 0512 02/18/13 0347  WBC 6.9 9.4 8.4 5.9 6.3  HGB 8.0* 9.4* 9.3* 8.7* 9.1*  HCT 25.8* 29.5* 30.3* 28.0* 29.0*  PLT 196 183 192 199 208  MCV 90.5 90.2 90.7 89.5 89.2  MCH 28.1 28.7 27.8 27.8 28.0  MCHC 31.0 31.9 30.7 31.1 31.4  RDW 18.2* 17.7* 17.8* 16.9* 16.9*    Chemistries   Recent Labs Lab 02/12/13 0430 02/13/13 0359 02/14/13 0040 02/16/13 0512 02/18/13 0347  NA 141 140 140 139 139  K 4.1 4.3 3.8 3.3* 3.4*  CL 106 106 105 100 104  CO2 24 25 26 29 26   GLUCOSE 92 115* 108* 89 101*  BUN 8 9 10 8 7   CREATININE 1.43* 1.45* 1.44* 1.16 1.28  CALCIUM 9.4 8.9 8.9 9.2 8.7  AST 6  --  7  --   --   ALT 9  --  6  --   --   ALKPHOS 81  --  72  --   --   BILITOT 0.5  --  2.1*  --   --    ------------------------------------------------------------------------------------------------------------------ estimated creatinine clearance is 85.6 ml/min (by C-G formula based on Cr of 1.28). ------------------------------------------------------------------------------------------------------------------ No results found for this basename: HGBA1C,  in the last 72 hours ------------------------------------------------------------------------------------------------------------------ No results found for this basename: CHOL, HDL, LDLCALC, TRIG, CHOLHDL, LDLDIRECT,  in the last 72 hours ------------------------------------------------------------------------------------------------------------------ No results found for this basename: TSH, T4TOTAL, FREET3, T3FREE, THYROIDAB,  in the last 72  hours ------------------------------------------------------------------------------------------------------------------ No results found for this basename: VITAMINB12, FOLATE, FERRITIN, TIBC, IRON, RETICCTPCT,  in the last 72 hours  Coagulation profile  Recent Labs Lab 02/12/13 0430  INR 1.08    No results found for this basename: DDIMER,  in the last 72 hours  Cardiac Enzymes No results found for this basename: CK, CKMB, TROPONINI, MYOGLOBIN,  in the last 168 hours ------------------------------------------------------------------------------------------------------------------ No components found with this basename: POCBNP,

## 2013-02-18 NOTE — Progress Notes (Signed)
RT found patient off bipap and on nonrebreather 100%. Patient is tolerating NRB well at this time. RT will continue to monitor.

## 2013-02-18 NOTE — Transfer of Care (Signed)
Immediate Anesthesia Transfer of Care Note  Patient: Larry [name]  Procedure(s) Performed: Procedure(s): EXPLORATORY LAPAROTOMY/Closure of Wound (N/A)  Patient Location: PACU  Anesthesia Type:General  Level of Consciousness: Patient remains intubated per anesthesia plan  Airway & Oxygen Therapy: Patient remains intubated per anesthesia plan and Patient placed on Ventilator (see vital sign flow sheet for setting)  Post-op Assessment: Report given to PACU RN  Post vital signs: Reviewed, Tachycardic 110's, otherwise stable.  Complications: No apparent anesthesia complications

## 2013-02-18 NOTE — Progress Notes (Signed)
Utilization review completed.  

## 2013-02-18 NOTE — Progress Notes (Signed)
Pt waking up  Strong bilateral hand grips lifting head well dr Denton Ar at bedside extubated by dr hoderiene placed on non rebreathing at 15l/[place] 02 sats=92-99 will continue to [facility]

## 2013-02-18 NOTE — Progress Notes (Signed)
Pt arrived to pacu with ett in place assisted ventilations with ambu bag by jenna crna placec on ventilator with settings of tv=500 fio2=50 ps=15 pep=5

## 2013-02-18 NOTE — Progress Notes (Signed)
Pt ng tube removed with extubation will notrify dr Evette Doffing

## 2013-02-18 NOTE — Progress Notes (Signed)
Bipap was initiated d/t pt w/ increased WOB, BBSH w/ crackles, pt c/o being SOB.  Dr. Chaney Malling at bedside and aware.

## 2013-02-18 NOTE — Preoperative (Signed)
Beta Blockers   Reason not to administer Beta Blockers:Not Applicable, took metoprolol 12/29 1610

## 2013-02-18 NOTE — Progress Notes (Signed)
Patient Name: Larry [name] Date of Encounter: 02/18/2013     Principal Problem:   HYPERLIPIDEMIA Active Problems:   OBESITY   HYPERTENSION   CAD   CARDIOMYOPATHY, ISCHEMIC   ATRIAL TACHYCARDIA   C O P D   SLEEP APNEA, OBSTRUCTIVE   Acute renal failure   Hypotension   Chronic blood loss anemia   Atrial fibrillation   Colon cancer   Acute on chronic diastolic heart failure    SUBJECTIVE: Somnolent. No chest pain or shortness of breath.    OBJECTIVE  Filed Vitals:   02/18/13 1445 02/18/13 1500 02/18/13 1515 02/18/13 1530  BP: 127/68 141/82 129/87 131/89  Pulse:      Temp:    99.1 F (37.3 C)  TempSrc:      Resp: 22 25 24 24   Height:      Weight:      SpO2:   100%     Intake/Output Summary (Last 24 hours) at 02/18/13 1536 Last data filed at 02/18/13 1315  Gross per 24 hour  Intake    750 ml  Output   2250 ml  Net  -1500 ml   Weight change:   PHYSICAL EXAM  General: Well developed, obese-appearing male in no acute distress. Head: Normocephalic, atraumatic, sclera non-icteric, no xanthomas, nares are without discharge.  Neck: Supple without bruits or JVD. Lungs:  Resp regular and unlabored, CTA. Aided by BiPAP.  Heart: Regular, tachycardic, occasional extra systoles, no s3, s4, or murmurs. Abdomen: Soft, distended, tender to palpation, non-tender, hypoactive BS + x 4.  Msk:  Strength and tone appears normal for age. Extremities: No clubbing, cyanosis or edema. DP/PT/Radials 2+ and equal bilaterally. Neuro: Alert and oriented X 3. Moves all extremities spontaneously. Psych: Normal affect.  LABS:  Recent Labs     02/16/13  0512  02/18/13  0347  WBC  5.9  6.3  HGB  8.7*  9.1*  HCT  28.0*  29.0*  MCV  89.5  89.2  PLT  199  208   Recent Labs Lab 02/14/13 0040 02/16/13 0512 02/18/13 0347  NA 140 139 139  K 3.8 3.3* 3.4*  CL 105 100 104  CO2 26 29 26   BUN 10 8 7   CREATININE 1.44* 1.16 1.28  CALCIUM 8.9 9.2 8.7  PROT 6.0  --   --     BILITOT 2.1*  --   --   ALKPHOS 72  --   --   ALT 6  --   --   AST 7  --   --   GLUCOSE 108* 89 101*   ECG: atrial tachycardia, 158 bpm, inferior IVD, poor R wave progression, no ST/T changes   Radiology/Studies:  Ct Chest W Contrast  02/08/2013   CLINICAL DATA:  Mass in transverse colon  EXAM: CT CHEST, ABDOMEN, AND PELVIS WITH CONTRAST  TECHNIQUE: Multidetector CT imaging of the chest, abdomen and pelvis was performed following the standard protocol during bolus administration of intravenous contrast.  CONTRAST:  OMNIPAQUE IOHEXOL 300 MG/ML  SOLN  COMPARISON:  100 cc of Omni 300  FINDINGS: CT CHEST FINDINGS  There is no pleural effusion identified. Subpleural nodule within the anterior right upper lobe measures 5 mm, image 33/series 3. Subpleural scar is identified in the right upper lobe, image 28/series 3.  The trachea appears patent and midline. The heart size is normal. Calcified atherosclerotic disease affects the thoracic aorta as well as the RCA, LAD and left circumflex coronary artery.  No pericardial effusion noted. There is no mediastinal or hilar adenopathy. No axillary or supraclavicular adenopathy identified.  Review of the visualized bony structures is significant for mild multilevel thoracic spondylosis.  CT ABDOMEN AND PELVIS FINDINGS  There are several small, indeterminate low attenuation structures within the liver parenchyma. These are too small to characterize. Index hypodensity within the right hepatic lobe measures 6 mm, image number 58/series 2. Near the dome of liver there is a 5 mm hypodensity, image 51/series 2. Posterior right hepatic lobe hypodensity measures 5 mm, image 62/series 2. The gallbladder appears normal. There is no biliary dilatation. Normal appearance of the pancreas. The spleen is unremarkable.  Normal appearance of the adrenal glands. Left renal cyst identified. Bilateral renal hypodensities are noted likely representing cysts. 3.8 cm diverticula arises  from the right posterior bladder wall. Calcifications within the prostate gland are identified.  Calcified atherosclerotic disease affects the abdominal aorta. There is no aneurysm. No retroperitoneal adenopathy identified. No pelvic or inguinal adenopathy noted.  Normal appearance of the stomach. The small bowel loops are within normal limits circumferential mass involving the mid transverse colon is identified, image number 82/series 2. This measures approximately 6 cm in length. There is increased caliber of the proximal large bowel suggesting partial obstruction. No ascites or peritoneal nodularity identified.  Review of the visualized osseous structures is significant for mild lumbar spondylosis. No aggressive lytic or sclerotic bone lesions identified.  IMPRESSION: CT chest:  1. Circumferential mass involving the mid transverse colon is identified compatible with colon carcinoma. 2. Subpleural nodule in the right upper lobe measures 5 mm. 3. Several low-attenuation foci are identified within the liver parenchyma. These are too small to reliably characterize. In a patient with a new diagnosis of colon cancer I would suggest more definitive characterization with contrast enhanced MRI of the liver.   Electronically Signed   By: Signa Kell M.D.   On: 02/08/2013 14:46   Ct Abdomen Pelvis W Contrast  02/08/2013   CLINICAL DATA:  Mass in transverse colon  EXAM: CT CHEST, ABDOMEN, AND PELVIS WITH CONTRAST  TECHNIQUE: Multidetector CT imaging of the chest, abdomen and pelvis was performed following the standard protocol during bolus administration of intravenous contrast.  CONTRAST:  OMNIPAQUE IOHEXOL 300 MG/ML  SOLN  COMPARISON:  100 cc of Omni 300  FINDINGS: CT CHEST FINDINGS  There is no pleural effusion identified. Subpleural nodule within the anterior right upper lobe measures 5 mm, image 33/series 3. Subpleural scar is identified in the right upper lobe, image 28/series 3.  The trachea appears  patent and midline. The heart size is normal. Calcified atherosclerotic disease affects the thoracic aorta as well as the RCA, LAD and left circumflex coronary artery. No pericardial effusion noted. There is no mediastinal or hilar adenopathy. No axillary or supraclavicular adenopathy identified.  Review of the visualized bony structures is significant for mild multilevel thoracic spondylosis.  CT ABDOMEN AND PELVIS FINDINGS  There are several small, indeterminate low attenuation structures within the liver parenchyma. These are too small to characterize. Index hypodensity within the right hepatic lobe measures 6 mm, image number 58/series 2. Near the dome of liver there is a 5 mm hypodensity, image 51/series 2. Posterior right hepatic lobe hypodensity measures 5 mm, image 62/series 2. The gallbladder appears normal. There is no biliary dilatation. Normal appearance of the pancreas. The spleen is unremarkable.  Normal appearance of the adrenal glands. Left renal cyst identified. Bilateral renal hypodensities are noted likely representing cysts.  3.8 cm diverticula arises from the right posterior bladder wall. Calcifications within the prostate gland are identified.  Calcified atherosclerotic disease affects the abdominal aorta. There is no aneurysm. No retroperitoneal adenopathy identified. No pelvic or inguinal adenopathy noted.  Normal appearance of the stomach. The small bowel loops are within normal limits circumferential mass involving the mid transverse colon is identified, image number 82/series 2. This measures approximately 6 cm in length. There is increased caliber of the proximal large bowel suggesting partial obstruction. No ascites or peritoneal nodularity identified.  Review of the visualized osseous structures is significant for mild lumbar spondylosis. No aggressive lytic or sclerotic bone lesions identified.  IMPRESSION: CT chest:  1. Circumferential mass involving the mid transverse colon is  identified compatible with colon carcinoma. 2. Subpleural nodule in the right upper lobe measures 5 mm. 3. Several low-attenuation foci are identified within the liver parenchyma. These are too small to reliably characterize. In a patient with a new diagnosis of colon cancer I would suggest more definitive characterization with contrast enhanced MRI of the liver.   Electronically Signed   By: Signa Kell M.D.   On: 02/08/2013 14:46   US Renal  02/02/2013   CLINICAL DATA:  Renal failure  EXAM: RENAL/URINARY TRACT ULTRASOUND COMPLETE  COMPARISON:  None.  FINDINGS: Right Kidney:  Length: 11.6 cm. Echogenic renal parenchyma, suggesting medical renal disease. 1.4 x 1.1 x 1.2 cm upper pole cyst. No hydronephrosis.  Left Kidney:  Length: 11.7 cm. Echogenic renal parenchyma, suggesting medical renal disease. 2.1 x 2.0 x 2.0 cm upper pole cyst. No hydronephrosis.  Bladder:  Decompressed by indwelling Foley catheter.  IMPRESSION: Echogenic renal parenchyma, suggesting medical renal disease.  Bilateral renal cysts.  No hydronephrosis.   Electronically Signed   By: Charline Bills M.D.   On: 02/02/2013 20:59   Mr Liver W Contrast  02/14/2013   CLINICAL DATA:  Colonic mass, presumably colon cancer, at prior imaging, liver masses on prior CT  EXAM: MRI ABDOMEN WITHOUT CONTRAST  TECHNIQUE: Multiplanar multisequence MR imaging of the abdomen was performed after the administration of intravenous contrast.  CONTRAST:  10 cc Eovist IV contrast  COMPARISON:  CT 02/08/2013  FINDINGS: Small bilateral pleural effusions are identified. Evidence of midline abdominal incision and diverting right lower quadrant colostomy. Motion artifact is present on multiple series. This essentially obscures detail of the previously described sub cm hepatic lesions, although no enhancement is identified at the largest of these masses measuring 6 mm within the right hepatic lobe, for example image 35 series 503 allowing for motion. On T2  weighted images, the previously seen sub cm hypodense lesions in the right hepatic lobe appear T2 hyperintense. The dome of medial segment left hepatic lobe 3 mm lesion appears T2 hyperintense but is too small for definitive characterization on postcontrast imaging allowing for motion.  Adrenal glands, spleen, pancreas, and gallbladder are normal. No ascites. Bilateral renal cortical cysts are noted.  IMPRESSION: Hepatic T2 hyperintense lesions are identified most compatible with biliary cysts or hamartomas. Because of patient motion, only the largest of these measuring 6 mm in the posterior segment right hepatic lobe can be relatively confidently identified as lacking internal contrast enhancement. The others are too small for postcontrast evaluation due to patient motion. Further workup is as per the overall patient's periodic restaging plan.  Bilateral small pleural effusions.  Interval diverting right lower quadrant colostomy.   Electronically Signed   By: Christiana Pellant M.D.   On: 02/14/2013 09:09  Dg Chest Port 1 View  02/18/2013   CLINICAL DATA:  Postop congestion.  EXAM: PORTABLE CHEST - 1 VIEW  COMPARISON:  02/08/2013 CT chest.  02/03/2012 chest x-ray.  FINDINGS: Cardiomegaly.  Pulmonary vascular congestion most notable centrally.  Question basilar subsegmental atelectasis.  CT detected subpleural nodule right upper lobe not evaluated on present exam.  IMPRESSION: Cardiomegaly.  Pulmonary vascular congestion most notable centrally.  Question basilar subsegmental atelectasis.  CT detected subpleural nodule right upper lobe not evaluated on present exam.   Electronically Signed   By: Bridgett Larsson M.D.   On: 02/18/2013 13:13   Dg Chest Portable 1 View  02/02/2013   CLINICAL DATA:  Weakness, presyncope  EXAM: PORTABLE CHEST - 1 VIEW  COMPARISON:  06/16/2011  FINDINGS: Mildly low lung volumes. There is mild elevation the right hemidiaphragm. Cardiac silhouette is within normal limits. There is no  evidence of focal infiltrates, effusions, nor edema. Dextroscoliosis identified within the thoracic spine.  IMPRESSION: No evidence of acute cardiopulmonary disease.   Electronically Signed   By: Salome Holmes M.D.   On: 02/02/2013 17:34    Current Medications:  . esmolol  100 mcg/kg Intravenous Once  . etomidate      . lidocaine (cardiac) 100 mg/24ml      . metoprolol      . [facility] HOLD] metoprolol  5 mg Intravenous Q6H  . [MAR HOLD] pantoprazole (PROTONIX) IV  40 mg Intravenous Q24H  . potassium chloride  10 mEq Intravenous Q1 Hr x 4  . rocuronium      . succinylcholine        ASSESSMENT AND PLAN:  1. Obstructing colon mass s/p excision today- likely colon CA per reports. Management per primary/surgical teams.  2. Paroxysmal atrial tachycardia- intra-operatively today. Minimal response to esmolol bolus (HR 150-160 to 130s). Hemodynamically stable. SBP 130. Patient somnolent. No chest pain or shortness of breath. Suspect secondary to post-op catecholamine discharge with baseline anemia and hypokalemia this AM.  -- Start Cardizem 10mg /hr gtt  -- Low threshold to start amiodarone gtt for further control   3. CAD- stable.  -- Continue ASA when ok with surgery and tolerating PO  4. Ischemic cardiomyopathy- EF 45-50%. Monitor I/Os, daily weights with post-op volume overload. Continue to monitor. Lasix PRN for fluid excess. Keep I/Os neutral.     Signed, R. Hurman Horn, PA-C 02/18/2013, 3:36 PM  I have seen, examined the patient, and reviewed the above assessment and plan.  Changes to above are made where necessary.  Pt with recurrence of atrial tachycardia.  He is tolerating this reasonably well.  I agree that this is likely due to increased catechols.  Will start diltiazem drip.  If his atach persists, he may need a short coarse of IV amiodarone perioperatively. Will follow  Co Sign: Hillis Range, MD 02/18/2013 5:15 PM

## 2013-02-18 NOTE — Progress Notes (Signed)
ESmolol 10 mg IV lt hand IV site. Unable to document in EPIC [facility]

## 2013-02-18 NOTE — Progress Notes (Signed)
Orthopedic Tech Progress Note Patient Details:  Larry [name] 1950/03/12 [id number]  Ortho Devices Type of Ortho Device: Abdominal binder Ortho Device/Splint Location: abdomen Ortho Device/Splint Interventions: [name], [name] 02/18/2013, 8:00 PM

## 2013-02-19 ENCOUNTER — Inpatient Hospital Stay (HOSPITAL_COMMUNITY): Payer: Medicare Other

## 2013-02-19 ENCOUNTER — Encounter (HOSPITAL_COMMUNITY): Payer: Self-pay | Admitting: General Surgery

## 2013-02-19 DIAGNOSIS — R0602 Shortness of breath: Secondary | ICD-10-CM

## 2013-02-19 LAB — CBC
HCT: 29.3 % — ABNORMAL LOW (ref 39.0–52.0)
MCH: 27.8 pg (ref 26.0–34.0)
MCHC: 30.7 g/dL (ref 30.0–36.0)
MCV: 90.4 fL (ref 78.0–100.0)
Platelets: 199 10*3/uL (ref 150–400)
RBC: 3.24 MIL/uL — ABNORMAL LOW (ref 4.22–5.81)
WBC: 6.9 10*3/uL (ref 4.0–10.5)

## 2013-02-19 LAB — BASIC METABOLIC PANEL
BUN: 10 mg/dL (ref 6–23)
CO2: 25 mEq/L (ref 19–32)
Calcium: 8.5 mg/dL (ref 8.4–10.5)
Creatinine, Ser: 1.4 mg/dL — ABNORMAL HIGH (ref 0.50–1.35)
Glucose, Bld: 90 mg/dL (ref 70–99)

## 2013-02-19 LAB — GLUCOSE, CAPILLARY
Glucose-Capillary: 98 mg/dL (ref 70–99)
Glucose-Capillary: 91 mg/dL (ref 70–99)
Glucose-Capillary: 84 mg/dL (ref 70–99)
Glucose-Capillary: 89 mg/dL (ref 70–99)

## 2013-02-19 MED ORDER — ENOXAPARIN SODIUM 30 MG/0.3ML ~~LOC~~ SOLN
30.0000 mg | SUBCUTANEOUS | Status: DC
  Administered 2013-02-20: 30 mg via SUBCUTANEOUS
  Filled 2013-02-19 (×2): qty 0.3

## 2013-02-19 MED ORDER — IPRATROPIUM BROMIDE 0.02 % IN SOLN
0.5000 mg | Freq: Three times a day (TID) | RESPIRATORY_TRACT | Status: DC
  Administered 2013-02-19 – 2013-02-21 (×8): 0.5 mg via RESPIRATORY_TRACT
  Filled 2013-02-19 (×12): qty 2.5

## 2013-02-19 MED ORDER — IPRATROPIUM BROMIDE 0.02 % IN SOLN
0.5000 mg | RESPIRATORY_TRACT | Status: DC
  Administered 2013-02-19: 0.5 mg via RESPIRATORY_TRACT
  Filled 2013-02-19: qty 2.5

## 2013-02-19 MED ORDER — LEVALBUTEROL HCL 0.63 MG/3ML IN NEBU
0.6300 mg | INHALATION_SOLUTION | Freq: Three times a day (TID) | RESPIRATORY_TRACT | Status: DC
  Administered 2013-02-19 – 2013-02-21 (×8): 0.63 mg via RESPIRATORY_TRACT
  Filled 2013-02-19 (×18): qty 3

## 2013-02-19 NOTE — Progress Notes (Signed)
Physical Therapy Treatment Patient Details Name: Larry [name] MRN: [id number] DOB: 1950-03-30 Today's Date: 02/19/2013 Time: 4098-1191 PT Time Calculation (min): 24 min  PT Assessment / Plan / Recommendation  History of Present Illness Pt went back to OR 12/29 for abd wound dehiscence closure.Pt is a 62 y.o. male adm from home due to generalized weakness.  Patient with colon mass - adenocarcinoma.  Patient s/p colectomy and colostomy 12/23.   PT Comments   Pt tolerated OOB mobility for first time up s/p surgery. Pt deconditioned but suspect pt to progress well enough to be able return home with spouse and use of RW. Acute PT to con't to follow to progress mobility and reassess d/c recommendations.  Follow Up Recommendations  Home health PT;Supervision/Assistance - 24 hour     Does the patient have the potential to tolerate intense rehabilitation     Barriers to Discharge        Equipment Recommendations  Rolling walker with 5" wheels    Recommendations for Other Services    Frequency Min 3X/week   Progress towards PT Goals Progress towards PT goals: Progressing toward goals  Plan Current plan remains appropriate    Precautions / Restrictions Precautions Precautions: Fall Precaution Comments: colostomy Restrictions Weight Bearing Restrictions: No   Pertinent Vitals/Pain 6/10 abdominal incisional pain    Mobility  Bed Mobility Bed Mobility: Rolling Left;Left Sidelying to Sit Rolling Left: 3: Mod assist Left Sidelying to Sit: 2: Max assist Details for Bed Mobility Assistance: maxA for trunk elevation Transfers Transfers: Sit to Stand;Stand to Sit Sit to Stand: With upper extremity assist;With armrests;From chair/3-in-1;1: +2 Total assist Sit to Stand: Patient Percentage: 60% Stand to Sit: 4: Min assist;With upper extremity assist;To chair/3-in-1 Details for Transfer Assistance: assist for anterior weight-shift due to limited trunk flexion secondary to abdominal  distention Ambulation/Gait Ambulation/Gait Assistance: 4: Min assist (2nd person for lines/O2 and chair follow) Ambulation Distance (Feet): 30 Feet Assistive device: Rolling walker Ambulation/Gait Assistance Details: no episodes of LOB however + SOB and onset of fatigue Gait Pattern: Step-through pattern;Decreased stride length Gait velocity: slow Stairs: No    Exercises     PT Diagnosis:    PT Problem List:   PT Treatment Interventions:     PT Goals (current goals can now be found in the care plan section)    Visit Information  Last PT Received On: 02/19/13 Assistance Needed: +1 (2nd person helpful for lines) History of Present Illness: Pt went back to OR 12/29 for abd wound dehiscence closure.Pt is a 62 y.o. male adm from home due to generalized weakness.  Patient with colon mass - adenocarcinoma.  Patient s/p colectomy and colostomy 12/23.    Subjective Data      Cognition  Cognition Arousal/Alertness: Awake/alert Behavior During Therapy: WFL for tasks assessed/performed Overall Cognitive Status: Within Functional Limits for tasks assessed    Balance  Static Sitting Balance Static Sitting - Balance Support: Feet supported Static Sitting - Level of Assistance: 5: Stand by assistance  End of Session PT - End of Session Equipment Utilized During Treatment: Gait belt Activity Tolerance: Patient limited by fatigue Patient left: in chair;with call bell/phone within reach;with family/visitor present;with nursing/sitter in room Nurse Communication: Mobility status   GP     Marcene Brawn 02/19/2013, 4:58 PM  Lewis Shock, PT, DPT Pager #: [phone] Office #: [phone]

## 2013-02-19 NOTE — Progress Notes (Addendum)
     Patient ID: Larry [name], male   DOB: [birth year], 63 y.o.   MRN: [id number] Subjective:   62 yo male, hx of HTN, hyperlipidemia, amemia diastolic CHF, COPD, NSVT, CAD ( stenting of RCA) admitted with weakness, renal failure and colonic mass.  He had hemicolectomy on 12/23 and repeat surgery 12/29.    Developed atrial tachycardia yesterday.  Rates are improved on diltiazem and he is presently asymptomatic.  Objective:  Vital Signs in the last 24 hours: Temp:  [97.7 F (36.5 C)-102.2 F (39 C)] 99.7 F (37.6 C) (12/30 0741) Pulse Rate:  [63-158] 102 (12/30 0741) Resp:  [15-29] 18 (12/30 0741) BP: (91-167)/(55-119) 123/81 mmHg (12/30 0741) SpO2:  [95 %-100 %] 96 % (12/30 0741) FiO2 (%):  [35 %-50 %] 35 % (12/29 2300)  Intake/Output from previous day: 12/29 0701 - 12/30 0700 In: 2165 [I.V.:1515; IV Piggyback:650] Out: 2325 [Urine:1975; Emesis/NG output:100] Intake/Output from this shift: Total I/O In: 80 [I.V.:80] Out: 150 [Urine:100; Emesis/NG output:50]  Physical Exam: Alert, NAD HEENT: NG tube in place,  Neck:  Supple Lungs:  Coarse BS this am, normal WOB HEART:  Reg Rate, normal S1 S2  Abd:  Distended, hypoactive BS Ext:  No edema Skin:  No rashes no nodules Neuro:  CN II through XII intact, motor grossly intact  Lab Results:  Recent Labs  02/18/13 0347 02/18/13 1721  WBC 6.3 8.5  HGB 9.1* 9.4*  PLT 208 194    Recent Labs  02/18/13 0347 02/18/13 1721  NA 139  --   K 3.4*  --   CL 104  --   CO2 26  --   GLUCOSE 101*  --   BUN 7  --   CREATININE 1.28 1.51*   Hepatic Function Panel No results found for this basename: PROT, ALBUMIN, AST, ALT, ALKPHOS, BILITOT, BILIDIR, IBILI,  in the last 72 hours  Cardiac Studies: Tele - NSR at 110  Assessment/Plan:  1.  ? Colon Cancer with liver mets:  S/P abdominal surgery-management per general surgery.   2. CAD with LV dyfunction -resume ASA when able 3. Atach- continue diltiazem drip until taking  POs then return to previous home regimen   Mildly volume overloaded,  Would keep Is and Os even to slightly negative.

## 2013-02-19 NOTE — Progress Notes (Signed)
Patient ID: Larry [name], male   DOB: 1950/12/20, 62 y.o.   MRN: [id number] 1 Day Post-Op  Subjective: Pt feels ok today.  Some pain.  Has flatus in his ostomy.  Objective: Vital signs in last 24 hours: Temp:  [97.7 F (36.5 C)-102.2 F (39 C)] 99.7 F (37.6 C) (12/30 0741) Pulse Rate:  [63-158] 102 (12/30 0741) Resp:  [15-29] 18 (12/30 0741) BP: (91-167)/(55-119) 123/81 mmHg (12/30 0741) SpO2:  [95 %-100 %] 97 % (12/30 0835) FiO2 (%):  [35 %-50 %] 35 % (12/29 2300) Last BM Date: 02/17/13  Intake/Output from previous day: 12/29 0701 - 12/30 0700 In: 2165 [I.V.:1515; IV Piggyback:650] Out: 2325 [Urine:1975; Emesis/NG output:100] Intake/Output this shift: Total I/O In: 80 [I.V.:80] Out: 150 [Urine:100; Emesis/NG output:50]  PE: Abd: soft, +BS, obese, NGT with minimal output, wound is clean. Small opening in the middle portion of the wound with serous drainage, but no evidence of dehiscence or breakdown.  Packing replaced.  Lab Results:   Recent Labs  02/18/13 0347 02/18/13 1721  WBC 6.3 8.5  HGB 9.1* 9.4*  HCT 29.0* 30.4*  PLT 208 194   BMET  Recent Labs  02/18/13 0347 02/18/13 1721  NA 139  --   K 3.4*  --   CL 104  --   CO2 26  --   GLUCOSE 101*  --   BUN 7  --   CREATININE 1.28 1.51*  CALCIUM 8.7  --    PT/INR No results found for this basename: LABPROT, INR,  in the last 72 hours CMP     Component Value Date/Time   NA 139 02/18/2013 0347   K 3.4* 02/18/2013 0347   CL 104 02/18/2013 0347   CO2 26 02/18/2013 0347   GLUCOSE 101* 02/18/2013 0347   BUN 7 02/18/2013 0347   CREATININE 1.51* 02/18/2013 1721   CALCIUM 8.7 02/18/2013 0347   PROT 6.0 02/14/2013 0040   ALBUMIN 2.5* 02/14/2013 0040   AST 7 02/14/2013 0040   ALT 6 02/14/2013 0040   ALKPHOS 72 02/14/2013 0040   BILITOT 2.1* 02/14/2013 0040   GFRNONAA 49* 02/18/2013 1721   GFRAA 56* 02/18/2013 1721   Lipase     Component Value Date/Time   LIPASE 15 11/05/2008 1215        Studies/Results: Dg Chest Port 1 View  02/19/2013   CLINICAL DATA:  Cough, congestion  EXAM: PORTABLE CHEST - 1 VIEW  COMPARISON:  02/18/2013  FINDINGS: The cardiac silhouette is enlarged. Low lung volumes. The lungs are clear. An NG tube is seen tip projecting in the expected region of the stomach, fundal region. The osseous structures are unremarkable.  IMPRESSION: Low lung volumes, cardiomegaly, no evidence of acute cardiopulmonary disease.   Electronically Signed   By: Salome Holmes M.D.   On: 02/19/2013 08:39   Dg Chest Port 1 View  02/18/2013   CLINICAL DATA:  Postop congestion.  EXAM: PORTABLE CHEST - 1 VIEW  COMPARISON:  02/08/2013 CT chest.  02/03/2012 chest x-ray.  FINDINGS: Cardiomegaly.  Pulmonary vascular congestion most notable centrally.  Question basilar subsegmental atelectasis.  CT detected subpleural nodule right upper lobe not evaluated on present exam.  IMPRESSION: Cardiomegaly.  Pulmonary vascular congestion most notable centrally.  Question basilar subsegmental atelectasis.  CT detected subpleural nodule right upper lobe not evaluated on present exam.   Electronically Signed   By: Bridgett Larsson M.D.   On: 02/18/2013 13:13    Anti-infectives: Anti-infectives   Start  Dose/Rate Route Frequency Ordered Stop   02/18/13 1057  ANCEF 1 gram in 0.9% normal saline 500 mL  Status:  Discontinued       As needed 02/18/13 1057 02/18/13 1152   02/12/13 2330  cefoTEtan (CEFOTAN) 1 g in dextrose 5 % 50 mL IVPB     1 g 100 mL/hr over 30 Minutes Intravenous  Once 02/12/13 1717 02/13/13 0024   02/12/13 0600  [MAR Hold]  cefoTEtan (CEFOTAN) 2 g in dextrose 5 % 50 mL IVPB     (On MAR Hold since 02/12/13 1206)   2 g 100 mL/hr over 30 Minutes Intravenous On call to O.R. 02/11/13 1706 02/12/13 1216       Assessment/Plan 1. Obstructing colon mass  2. POD #7 S/P EXPLORATORY LAPAROTOMY PARTIAL COLECTOMY WITH COLOSTOMY (Transverse colectomy with colostomy and long Hartman's  pouch) 02/12/2013, Cherylynn Ridges, MD.  POD1 from abdominal washout and closure from dehiscence 3. Acute on chronic renal failure on admit (creatinine now 1.28)  4. Anemia with GI blood loss.  5. CAD with CM; EF 45-50% 05/2011.  6. Hx of 2 RCA stents 2010./hx of atrial tachycardia/NSVT on BB and CCB's.  7. COPD/tobacco use Hx.  8. Hx of ETOH heavy use 20+ years, quit 2010.  9. Obesity Body mass index is 41.6.  10. Dyslipidemia  11. Deconditioning/malnutrition  12. Hypokalemia - 3.3 yesterday  13. Abdominal fascial dehiscence, resolved  Plan: 1. Patient has good BS today and has gas in his colostomy.  Will clamp his NGT for 6 hours.  If he does well, will dc and leave him NPO 2. Begin NS WD dressing changes to abdominal wound BID 3. Routine ostomy care.  The superior edge of stoma is dusky, but the rest of the stoma is ok. 4. A tach better today, per cards 5. PT eval today for mobilization.      LOS: 17 days    Lavoy Bernards E 02/19/2013, 9:42 AM Pager: [phone]

## 2013-02-19 NOTE — Consult Note (Addendum)
WOC ostomy follow up Wound type: Colostomy to RUQ from 12/23.  Pt returned to OR yesterday for abd wound dehiscence and CCS is following for assessment and plan of care to abd wound. Measurement:Stoma 50% red, 50% necrotic, slightly above skin level, 1 3/4 inches Pouch intact with good seal.  No stool or flatus at this time.  Small amt brown drainage.   Wife at bedside.  Pt has NG and is still in large amt pain after yesterday's surgery.  Will demonstrate pouch change when stable and out of ICU.  Supplies ordered to bedside for staff use. Cammie Mcgee MSN, RN, CWOCN, [place], CNS [phone]

## 2013-02-19 NOTE — Progress Notes (Signed)
Resting comfortably Heart rate better on Diltiazem gtt Mobilize today.  Wilmon Arms. Corliss Skains, MD, [facility]  General/ Trauma Surgery  02/19/2013 10:00 AM

## 2013-02-19 NOTE — Progress Notes (Signed)
[facility]                                                                                Patient Demographics  Larry [name], is a 62 y.o. male, DOB - [birth year], ZOX:[id number]  Admit date - 02/02/2013   Admitting Physician Hillary Bow, DO  Outpatient Primary MD for the patient is No primary provider on file.  LOS - 17   Chief Complaint  Patient presents with  . Weakness  . Near Syncope        Assessment & Plan   Principal Problem:   HYPERLIPIDEMIA Active Problems:   OBESITY   HYPERTENSION   CAD   CARDIOMYOPATHY, ISCHEMIC   ATRIAL TACHYCARDIA   C O P D   SLEEP APNEA, OBSTRUCTIVE   Acute renal failure   Hypotension   Chronic blood loss anemia   Atrial fibrillation   Colon cancer   Acute on chronic diastolic heart failure  Colonic mass: -CT abd scan showed Circumferential mass involving the mid transverse colon is identified compatible with colon carcinoma. Severallow-attenuation foci are identified within the liver parenchyma  -Colonoscopy Obstructive mass on 12/19 and Bx showed: INVASIVE ADENOCARCINOMA.   -CT chest: Subpleural nodule in the right upper lobe measures 5 mm.  -Surgery consulted and colectomy and colostomy on 12/23 -Liver MRI did not showed enhancing lesions. Will need follow up with oncology as an outpatient.  -Ambulate patient -Surgery following, patient did not tolerate a clear liquid diet, continues to have nausea and vomiting. -Patient has abdominal fascial dehiscence and underwent exploratory laparotomy with abdominal washout and closure of fascia on 02/18/2013  Hx of diastolic CHF as well as ischemic CM/systolic CHF  -EF 45-50% via cath 2013  -Cardiology consulted.  -Additional dose of Lasix has been ordered by cardiology -Continue monitoring daily weights, intake and output  Paroxysmal Atrial fibrillation/SVT  -Cardiology following -Patient currently on diltiazem gtt. -Once patient can tolerate orals, he will be sore he  started on his home regimen.  Acute on Chronic Renal Failure:  -Creatinine 1.40, will continue to monitor -Renal ultrasound showed no hydronephrosis  -May be secondary to medications versus dehydration   Hypotension  -Likely secondary to hypovolemia  -Resolved   Normocytic anemia  -H/H 9.0/29.3, stable -Multifactorial: GI related vs renal disease.- Patient has received 4units PRBCs this admission  -EGD showed antral erosions and duodenitis.  -Colonoscopy showed: as above.   B12 and folate deficiency  -B12 level Oct 2014 was quit low at 251 - repeated this admit 393 but given anemia, received loading dose  -Folate low so was given IV, PO held due to recent surgery  COPD  -Well compensated at present and not requiring any medication  -Has Xopenex when necessary   Hx of CAD s/p PTCA w/ stents x 2 2010  -Asymptomatic (see above)  -Cardiology following  Obesity - Body mass index is 41.46 kg/(m^2).   Nausea  -Continue zofran as needed  SOB, wheezing -Will obtain chest x-ray. -Will continue Xopenex inhaler as well as add Atrovent nebulizer treatments.  Code Status: Full  Family Communication: None at bedside  Disposition Plan: Admitted.    Procedures  -EGD showed antral erosions  and duodenitis.   -Colonoscopy showed obstructing colon mass, transverse colon   -Echocardiogram 12/21 Left ventricle: The cavity size was mildly dilated. Wall thickness was increased in a pattern of mild LVH. Systolic function was mildly reduced. The estimated ejection fraction was in the range of 45% to 50%.   -Colectomy and colostomy 02/12/2013  -Exploratory laparotomy with abdominal washout and closure of fascia on 02/18/2013   Consults   Gastroenterology Surgery Cardiology  DVT Prophylaxis Lovenox  Lab Results  Component Value Date   PLT 199 02/19/2013    Medications  Scheduled Meds: . enoxaparin (LOVENOX) injection  40 mg Subcutaneous Q24H  . ipratropium  0.5 mg  Nebulization TID  . levalbuterol  0.63 mg Nebulization TID  . metoprolol  5 mg Intravenous Q6H  . pantoprazole (PROTONIX) IV  40 mg Intravenous Q24H   Continuous Infusions: . sodium chloride 75 mL/hr (02/18/13 1600)  . diltiazem (CARDIZEM) infusion 5 mg/hr (02/19/13 1000)  . lactated ringers 100 mL/hr at 02/16/13 0108  . lactated ringers 20 mL/hr at 02/18/13 0958   PRN Meds:.acetaminophen, HYDROcodone-acetaminophen, HYDROmorphone (DILAUDID) injection, levalbuterol, ondansetron (ZOFRAN) IV, promethazine  Antibiotics   Anti-infectives   Start     Dose/Rate Route Frequency Ordered Stop   02/18/13 1057  ANCEF 1 gram in 0.9% normal saline 500 mL  Status:  Discontinued       As needed 02/18/13 1057 02/18/13 1152   02/12/13 2330  cefoTEtan (CEFOTAN) 1 g in dextrose 5 % 50 mL IVPB     1 g 100 mL/hr over 30 Minutes Intravenous  Once 02/12/13 1717 02/13/13 0024   02/12/13 0600  [MAR Hold]  cefoTEtan (CEFOTAN) 2 g in dextrose 5 % 50 mL IVPB     (On MAR Hold since 02/12/13 1206)   2 g 100 mL/hr over 30 Minutes Intravenous On call to O.R. 02/11/13 1706 02/12/13 1216       Time Spent in minutes   30 minutes   Koden Hunzeker D.O. on 02/19/2013 at 1:12 PM  Between 7am to 7pm - Pager - [phone]  After 7pm go to [email or web address] - password [email or web address]  And look for the night coverage person covering for me after hours  [facility] Office  [phone]    Subjective:   Larry [name] seen and examined today. Patient does state his abdominal pain has improved, his nausea and vomiting have also improved. Patient currently has a G-tube in place. Patient does state that he feels very wheezy and has been coughing.  Objective:   Filed Vitals:   02/19/13 0700 02/19/13 0741 02/19/13 0835 02/19/13 1201  BP: 130/78 123/81  130/62  Pulse: 98 102  108  Temp:  99.7 F (37.6 C)  100.2 F (37.9 C)  TempSrc:  Oral  Oral  Resp: 16 18  17   Height:      Weight:      SpO2: 98% 96%  97% 98%    Wt Readings from Last 3 Encounters:  02/12/13 135.172 kg (298 lb)  02/12/13 135.172 kg (298 lb)  02/12/13 135.172 kg (298 lb)     Intake/Output Summary (Last 24 hours) at 02/19/13 1312 Last data filed at 02/19/13 1202  Gross per 24 hour  Intake   1555 ml  Output   1475 ml  Net     80 ml    Exam  General: Well developed, well nourished, NAD, appears stated age  HEENT: NCAT, PERRLA, EOMI, Anicteic Sclera, mucous membranes moist. NG tube in place  Neck: Supple, no JVD, no masses  Cardiovascular: S1 S2 auscultated, no rubs, murmurs or gallops. Regular rate and rhythm.  Respiratory: Expiratory wheezing noted in all lung fields.  Abdomen: Soft, diffusely tender, nondistended, + bowel sounds, ostomy bag in place, incision is clean  Extremities: warm dry without cyanosis clubbing or edema  Neuro: AAOx3, cranial nerves grossly intact.   Skin: Without rashes exudates or nodules  Psych: Normal affect and demeanor with intact judgement and insight  Data Review   Micro Results No results found for this or any previous visit (from the past 240 hour(s)).  Radiology Reports Ct Chest W Contrast  02/08/2013   CLINICAL DATA:  Mass in transverse colon  EXAM: CT CHEST, ABDOMEN, AND PELVIS WITH CONTRAST  TECHNIQUE: Multidetector CT imaging of the chest, abdomen and pelvis was performed following the standard protocol during bolus administration of intravenous contrast.  CONTRAST:  OMNIPAQUE IOHEXOL 300 MG/ML  SOLN  COMPARISON:  100 cc of Omni 300  FINDINGS: CT CHEST FINDINGS  There is no pleural effusion identified. Subpleural nodule within the anterior right upper lobe measures 5 mm, image 33/series 3. Subpleural scar is identified in the right upper lobe, image 28/series 3.  The trachea appears patent and midline. The heart size is normal. Calcified atherosclerotic disease affects the thoracic aorta as well as the RCA, LAD and left circumflex coronary artery. No  pericardial effusion noted. There is no mediastinal or hilar adenopathy. No axillary or supraclavicular adenopathy identified.  Review of the visualized bony structures is significant for mild multilevel thoracic spondylosis.  CT ABDOMEN AND PELVIS FINDINGS  There are several small, indeterminate low attenuation structures within the liver parenchyma. These are too small to characterize. Index hypodensity within the right hepatic lobe measures 6 mm, image number 58/series 2. Near the dome of liver there is a 5 mm hypodensity, image 51/series 2. Posterior right hepatic lobe hypodensity measures 5 mm, image 62/series 2. The gallbladder appears normal. There is no biliary dilatation. Normal appearance of the pancreas. The spleen is unremarkable.  Normal appearance of the adrenal glands. Left renal cyst identified. Bilateral renal hypodensities are noted likely representing cysts. 3.8 cm diverticula arises from the right posterior bladder wall. Calcifications within the prostate gland are identified.  Calcified atherosclerotic disease affects the abdominal aorta. There is no aneurysm. No retroperitoneal adenopathy identified. No pelvic or inguinal adenopathy noted.  Normal appearance of the stomach. The small bowel loops are within normal limits circumferential mass involving the mid transverse colon is identified, image number 82/series 2. This measures approximately 6 cm in length. There is increased caliber of the proximal large bowel suggesting partial obstruction. No ascites or peritoneal nodularity identified.  Review of the visualized osseous structures is significant for mild lumbar spondylosis. No aggressive lytic or sclerotic bone lesions identified.  IMPRESSION: CT chest:  1. Circumferential mass involving the mid transverse colon is identified compatible with colon carcinoma. 2. Subpleural nodule in the right upper lobe measures 5 mm. 3. Several low-attenuation foci are identified within the liver  parenchyma. These are too small to reliably characterize. In a patient with a new diagnosis of colon cancer I would suggest more definitive characterization with contrast enhanced MRI of the liver.   Electronically Signed   By: Signa Kell M.D.   On: 02/08/2013 14:46   Ct Abdomen Pelvis W Contrast  02/08/2013   CLINICAL DATA:  Mass in transverse colon  EXAM: CT CHEST, ABDOMEN, AND PELVIS WITH CONTRAST  TECHNIQUE: Multidetector CT  imaging of the chest, abdomen and pelvis was performed following the standard protocol during bolus administration of intravenous contrast.  CONTRAST:  OMNIPAQUE IOHEXOL 300 MG/ML  SOLN  COMPARISON:  100 cc of Omni 300  FINDINGS: CT CHEST FINDINGS  There is no pleural effusion identified. Subpleural nodule within the anterior right upper lobe measures 5 mm, image 33/series 3. Subpleural scar is identified in the right upper lobe, image 28/series 3.  The trachea appears patent and midline. The heart size is normal. Calcified atherosclerotic disease affects the thoracic aorta as well as the RCA, LAD and left circumflex coronary artery. No pericardial effusion noted. There is no mediastinal or hilar adenopathy. No axillary or supraclavicular adenopathy identified.  Review of the visualized bony structures is significant for mild multilevel thoracic spondylosis.  CT ABDOMEN AND PELVIS FINDINGS  There are several small, indeterminate low attenuation structures within the liver parenchyma. These are too small to characterize. Index hypodensity within the right hepatic lobe measures 6 mm, image number 58/series 2. Near the dome of liver there is a 5 mm hypodensity, image 51/series 2. Posterior right hepatic lobe hypodensity measures 5 mm, image 62/series 2. The gallbladder appears normal. There is no biliary dilatation. Normal appearance of the pancreas. The spleen is unremarkable.  Normal appearance of the adrenal glands. Left renal cyst identified. Bilateral renal hypodensities are  noted likely representing cysts. 3.8 cm diverticula arises from the right posterior bladder wall. Calcifications within the prostate gland are identified.  Calcified atherosclerotic disease affects the abdominal aorta. There is no aneurysm. No retroperitoneal adenopathy identified. No pelvic or inguinal adenopathy noted.  Normal appearance of the stomach. The small bowel loops are within normal limits circumferential mass involving the mid transverse colon is identified, image number 82/series 2. This measures approximately 6 cm in length. There is increased caliber of the proximal large bowel suggesting partial obstruction. No ascites or peritoneal nodularity identified.  Review of the visualized osseous structures is significant for mild lumbar spondylosis. No aggressive lytic or sclerotic bone lesions identified.  IMPRESSION: CT chest:  1. Circumferential mass involving the mid transverse colon is identified compatible with colon carcinoma. 2. Subpleural nodule in the right upper lobe measures 5 mm. 3. Several low-attenuation foci are identified within the liver parenchyma. These are too small to reliably characterize. In a patient with a new diagnosis of colon cancer I would suggest more definitive characterization with contrast enhanced MRI of the liver.   Electronically Signed   By: Signa Kell M.D.   On: 02/08/2013 14:46   US Renal  02/02/2013   CLINICAL DATA:  Renal failure  EXAM: RENAL/URINARY TRACT ULTRASOUND COMPLETE  COMPARISON:  None.  FINDINGS: Right Kidney:  Length: 11.6 cm. Echogenic renal parenchyma, suggesting medical renal disease. 1.4 x 1.1 x 1.2 cm upper pole cyst. No hydronephrosis.  Left Kidney:  Length: 11.7 cm. Echogenic renal parenchyma, suggesting medical renal disease. 2.1 x 2.0 x 2.0 cm upper pole cyst. No hydronephrosis.  Bladder:  Decompressed by indwelling Foley catheter.  IMPRESSION: Echogenic renal parenchyma, suggesting medical renal disease.  Bilateral renal cysts.  No  hydronephrosis.   Electronically Signed   By: Charline Bills M.D.   On: 02/02/2013 20:59   Mr Liver W Contrast  02/14/2013   CLINICAL DATA:  Colonic mass, presumably colon cancer, at prior imaging, liver masses on prior CT  EXAM: MRI ABDOMEN WITHOUT CONTRAST  TECHNIQUE: Multiplanar multisequence MR imaging of the abdomen was performed after the administration of intravenous contrast.  CONTRAST:  10 cc Eovist IV contrast  COMPARISON:  CT 02/08/2013  FINDINGS: Small bilateral pleural effusions are identified. Evidence of midline abdominal incision and diverting right lower quadrant colostomy. Motion artifact is present on multiple series. This essentially obscures detail of the previously described sub cm hepatic lesions, although no enhancement is identified at the largest of these masses measuring 6 mm within the right hepatic lobe, for example image 35 series 503 allowing for motion. On T2 weighted images, the previously seen sub cm hypodense lesions in the right hepatic lobe appear T2 hyperintense. The dome of medial segment left hepatic lobe 3 mm lesion appears T2 hyperintense but is too small for definitive characterization on postcontrast imaging allowing for motion.  Adrenal glands, spleen, pancreas, and gallbladder are normal. No ascites. Bilateral renal cortical cysts are noted.  IMPRESSION: Hepatic T2 hyperintense lesions are identified most compatible with biliary cysts or hamartomas. Because of patient motion, only the largest of these measuring 6 mm in the posterior segment right hepatic lobe can be relatively confidently identified as lacking internal contrast enhancement. The others are too small for postcontrast evaluation due to patient motion. Further workup is as per the overall patient's periodic restaging plan.  Bilateral small pleural effusions.  Interval diverting right lower quadrant colostomy.   Electronically Signed   By: Christiana Pellant M.D.   On: 02/14/2013 09:09   Dg Chest  Portable 1 View  02/02/2013   CLINICAL DATA:  Weakness, presyncope  EXAM: PORTABLE CHEST - 1 VIEW  COMPARISON:  06/16/2011  FINDINGS: Mildly low lung volumes. There is mild elevation the right hemidiaphragm. Cardiac silhouette is within normal limits. There is no evidence of focal infiltrates, effusions, nor edema. Dextroscoliosis identified within the thoracic spine.  IMPRESSION: No evidence of acute cardiopulmonary disease.   Electronically Signed   By: Salome Holmes M.D.   On: 02/02/2013 17:34    CBC  Recent Labs Lab 02/13/13 0359 02/16/13 0512 02/18/13 0347 02/18/13 1721 02/19/13 0820  WBC 8.4 5.9 6.3 8.5 6.9  HGB 9.3* 8.7* 9.1* 9.4* 9.0*  HCT 30.3* 28.0* 29.0* 30.4* 29.3*  PLT 192 199 208 194 199  MCV 90.7 89.5 89.2 91.0 90.4  MCH 27.8 27.8 28.0 28.1 27.8  MCHC 30.7 31.1 31.4 30.9 30.7  RDW 17.8* 16.9* 16.9* 17.1* 17.6*    Chemistries   Recent Labs Lab 02/13/13 0359 02/14/13 0040 02/16/13 0512 02/18/13 0347 02/18/13 1721 02/19/13 0820  NA 140 140 139 139  --  143  K 4.3 3.8 3.3* 3.4*  --  4.0  CL 106 105 100 104  --  105  CO2 25 26 29 26   --  25  GLUCOSE 115* 108* 89 101*  --  90  BUN 9 10 8 7   --  10  CREATININE 1.45* 1.44* 1.16 1.28 1.51* 1.40*  CALCIUM 8.9 8.9 9.2 8.7  --  8.5  AST  --  7  --   --   --   --   ALT  --  6  --   --   --   --   ALKPHOS  --  72  --   --   --   --   BILITOT  --  2.1*  --   --   --   --    ------------------------------------------------------------------------------------------------------------------ estimated creatinine clearance is 78.3 ml/min (by C-G formula based on Cr of 1.4). ------------------------------------------------------------------------------------------------------------------ No results found for this basename: HGBA1C,  in the last 72 hours ------------------------------------------------------------------------------------------------------------------  No results found for this basename: CHOL, HDL, LDLCALC,  TRIG, CHOLHDL, LDLDIRECT,  in the last 72 hours ------------------------------------------------------------------------------------------------------------------ No results found for this basename: TSH, T4TOTAL, FREET3, T3FREE, THYROIDAB,  in the last 72 hours ------------------------------------------------------------------------------------------------------------------ No results found for this basename: VITAMINB12, FOLATE, FERRITIN, TIBC, IRON, RETICCTPCT,  in the last 72 hours  Coagulation profile No results found for this basename: INR, PROTIME,  in the last 168 hours  No results found for this basename: DDIMER,  in the last 72 hours  Cardiac Enzymes No results found for this basename: CK, CKMB, TROPONINI, MYOGLOBIN,  in the last 168 hours ------------------------------------------------------------------------------------------------------------------ No components found with this basename: POCBNP,

## 2013-02-19 NOTE — Progress Notes (Signed)
INITIAL NUTRITION ASSESSMENT  DOCUMENTATION CODES Per approved criteria  -Severe malnutrition in the context of chronic illness   INTERVENTION: If suspect will be unable to advance diet and bowel rest needed for another 4-6 days, should consider TPN.  NUTRITION DIAGNOSIS: Inadequate oral intake related to altered GI function as evidenced by NPO status.   Goal: Intake to meet >90% of estimated nutrition needs.  Monitor:  Diet advancement, PO intake, labs, weight trend.  Reason for Assessment: Prolonged NPO/Clear Liquid diet  62 y.o. male  Admitting Dx: Other and unspecified hyperlipidemia  ASSESSMENT: Patient presented to the ED on 12/13 after 4 weeks of generalized weakness. CT abd scan showed circumferential mass involving the mid transverse colon, compatible with colon carcinoma. Biopsy on 12/19 showed invasive adenocarcinoma. S/P colectomy and colostomy on 12/23. Developed wound dehiscence; S/P abdominal washout and closure on 12/29.  Patient was on a renal diet prior to surgery with fairly good intake. He was made NPO for surgery. Patient was started on clear liquids on 12/27, but did not tolerate them, so he was made NPO, remains NPO at this time. Patient is at increased nutrition risk given prolonged NPO/clear liquid status.  Patient with 9% weight loss over the past 3 months due to very poor oral intake for at least 4 weeks PTA.   Pt meets criteria for severe MALNUTRITION in the context of chronic illness as evidenced by 9% weight loss in the past 3 months and intake </= 75% of estimated energy requirement for >/= 1 month.  Nutrition focused physical exam completed.  No muscle or subcutaneous fat depletion noticed. Difficult to assess due to morbid obesity.  Height: Ht Readings from Last 1 Encounters:  02/07/13 5' 10.5" (1.791 m)    Weight: Wt Readings from Last 1 Encounters:  02/12/13 298 lb (135.172 kg)    Ideal Body Weight: 76.8 kg  % Ideal Body Weight:  176%  Wt Readings from Last 10 Encounters:  02/12/13 298 lb (135.172 kg)  02/12/13 298 lb (135.172 kg)  02/12/13 298 lb (135.172 kg)  02/12/13 298 lb (135.172 kg)  12/06/12 326 lb (147.873 kg)  07/04/11 340 lb (154.223 kg)  06/18/11 344 lb 9.6 oz (156.31 kg)  11/26/09 316 lb (143.337 kg)  01/12/09 304 lb (137.893 kg)  12/04/08 302 lb (136.986 kg)    Usual Body Weight: 326 lb 2.5 months ago  % Usual Body Weight: 91%  BMI:  Body mass index is 42.14 kg/(m^2). class 3, extreme/morbid obesity  Estimated Nutritional Needs: Kcal: 2400-2600 Protein: 140-150 gm Fluid: 2.4-2.6 L  Skin: abdominal surgical incisions  Diet Order: NPO  EDUCATION NEEDS: -Education not appropriate at this time   Intake/Output Summary (Last 24 hours) at 02/19/13 1344 Last data filed at 02/19/13 1202  Gross per 24 hour  Intake   1555 ml  Output   1175 ml  Net    380 ml    Last BM: 12/28   Labs:   Recent Labs Lab 02/16/13 0512 02/18/13 0347 02/18/13 1721 02/19/13 0820  NA 139 139  --  143  K 3.3* 3.4*  --  4.0  CL 100 104  --  105  CO2 29 26  --  25  BUN 8 7  --  10  CREATININE 1.16 1.28 1.51* 1.40*  CALCIUM 9.2 8.7  --  8.5  GLUCOSE 89 101*  --  90    CBG (last 3)   Recent Labs  02/19/13 0327 02/19/13 0740 02/19/13 1158  GLUCAP  98 91 84    Scheduled Meds: . [START ON 02/20/2013] enoxaparin (LOVENOX) injection  30 mg Subcutaneous Q24H  . ipratropium  0.5 mg Nebulization TID  . levalbuterol  0.63 mg Nebulization TID  . metoprolol  5 mg Intravenous Q6H  . pantoprazole (PROTONIX) IV  40 mg Intravenous Q24H    Continuous Infusions: . sodium chloride 75 mL/hr (02/18/13 1600)  . diltiazem (CARDIZEM) infusion 5 mg/hr (02/19/13 1000)  . lactated ringers 100 mL/hr at 02/16/13 0108  . lactated ringers 20 mL/hr at 02/18/13 1610    Past Medical History  Diagnosis Date  . Hypertension   . Hypercholesteremia   . CHF (congestive heart failure)     has diastolic heart failure  grade 1; EF is 45 to 50% per echo 05/2011  . Coronary artery disease   . COPD (chronic obstructive pulmonary disease)   . Asthma   . Obesity   . Noncompliance   . NSVT (nonsustained ventricular tachycardia)     beta blocker restarted  . Atrial tachycardia     managed on beta blocker therapy    Past Surgical History  Procedure Laterality Date  . Coronary stent placement      x2  . Cardiac catheterization  11/12/2008    stent x 2 to RCA  . Colonoscopy N/A 02/08/2013    Procedure: COLONOSCOPY;  Surgeon: Theda Belfast, MD;  Location: [facility] ENDOSCOPY;  Service: Endoscopy;  Laterality: N/A;  . Esophagogastroduodenoscopy N/A 02/08/2013    Procedure: ESOPHAGOGASTRODUODENOSCOPY (EGD);  Surgeon: Theda Belfast, MD;  Location: [facility] ENDOSCOPY;  Service: Endoscopy;  Laterality: N/A;  . Laparotomy N/A 02/12/2013    Procedure: EXPLORATORY LAPAROTOMY PARTIAL COLECTOMY WITH COLOSTOMY;  Surgeon: Cherylynn Ridges, MD;  Location: [facility] OR;  Service: General;  Laterality: N/A;    Joaquin Courts, RD, LDN, CNSC Pager [phone] After Hours Pager [phone]

## 2013-02-20 LAB — EXPECTORATED SPUTUM ASSESSMENT W GRAM STAIN, RFLX TO RESP C

## 2013-02-20 LAB — CBC
MCHC: 30.3 g/dL (ref 30.0–36.0)
MCV: 91.6 fL (ref 78.0–100.0)
Platelets: 210 10*3/uL (ref 150–400)
RBC: 3.1 MIL/uL — ABNORMAL LOW (ref 4.22–5.81)
RDW: 17.7 % — ABNORMAL HIGH (ref 11.5–15.5)
WBC: 5.5 10*3/uL (ref 4.0–10.5)

## 2013-02-20 LAB — GLUCOSE, CAPILLARY
Glucose-Capillary: 87 mg/dL (ref 70–99)
Glucose-Capillary: 87 mg/dL (ref 70–99)
Glucose-Capillary: 85 mg/dL (ref 70–99)
Glucose-Capillary: 81 mg/dL (ref 70–99)

## 2013-02-20 LAB — BASIC METABOLIC PANEL
CO2: 25 mEq/L (ref 19–32)
Chloride: 107 mEq/L (ref 96–112)
GFR calc Af Amer: 66 mL/min — ABNORMAL LOW (ref >90)
Glucose, Bld: 87 mg/dL (ref 70–99)
Potassium: 3.9 mEq/L (ref 3.7–5.3)

## 2013-02-20 MED ORDER — ENOXAPARIN SODIUM 60 MG/0.6ML ~~LOC~~ SOLN
60.0000 mg | SUBCUTANEOUS | Status: DC
  Administered 2013-02-21 – 2013-03-01 (×9): 60 mg via SUBCUTANEOUS
  Filled 2013-02-20 (×11): qty 0.6

## 2013-02-20 NOTE — Progress Notes (Signed)
     Patient ID: Larry [name], male   DOB: 1951/01/03, 62 y.o.   MRN: [id number] Subjective:   62 yo male, hx of HTN, hyperlipidemia, amemia diastolic CHF, COPD, NSVT, CAD ( stenting of RCA) admitted with weakness, renal failure and colonic mass.  He had hemicolectomy on 12/23 and repeat surgery 12/29.    Developed atrial tachycardia yesterday.  Rates are improved on diltiazem and he is presently asymptomatic.  Objective:  Vital Signs in the last 24 hours: Temp:  [98.1 F (36.7 C)-100.2 F (37.9 C)] 98.1 F (36.7 C) (12/31 0400) Pulse Rate:  [99-108] 99 (12/31 0400) Resp:  [12-19] 14 (12/31 0400) BP: (119-130)/(62-81) 124/78 mmHg (12/31 0400) SpO2:  [96 %-99 %] 99 % (12/31 0400)  Intake/Output from previous day: 12/30 0701 - 12/31 0700 In: 2130 [P.O.:300; I.V.:1830] Out: 1475 [Urine:1175; Emesis/NG output:300] Intake/Output from this shift:    Physical Exam: Alert, NAD HEENT: NG tube in place,  Neck:  Supple Lungs:  Coarse BS this am, normal WOB HEART:  Reg Rate, normal S1 S2  Abd:  Distended, hypoactive BS Ext:  No edema Skin:  No rashes no nodules Neuro:  CN II through XII intact, motor grossly intact  Lab Results:  Recent Labs  02/19/13 0820 02/20/13 0400  WBC 6.9 5.5  HGB 9.0* 8.6*  PLT 199 210    Recent Labs  02/19/13 0820 02/20/13 0400  NA 143 145  K 4.0 3.9  CL 105 107  CO2 25 25  GLUCOSE 90 87  BUN 10 10  CREATININE 1.40* 1.32   Hepatic Function Panel No results found for this basename: PROT, ALBUMIN, AST, ALT, ALKPHOS, BILITOT, BILIDIR, IBILI,  in the last 72 hours  Cardiac Studies: Tele - NSR at 90  Assessment/Plan:  1.  ? Colon Cancer with liver mets:  S/P abdominal surgery-management per general surgery.   2. CAD with LV dyfunction -resume ASA when able 3. Atach- resolved on dilt drip.  Convert to diltiazem CD 240mg  po daily once taking POs.  Mildly volume overloaded,  Would keep Is and Os even to slightly negative.  Will  see as needed Call with questions

## 2013-02-20 NOTE — Progress Notes (Signed)
[facility]                                                                                Patient Demographics  Larry [name], is a 62 y.o. male, DOB - September 26, 1950, RUE:[id number]  Admit date - 02/02/2013   Admitting Physician Hillary Bow, DO  Outpatient Primary MD for the patient is No primary provider on file.  LOS - 18   Chief Complaint  Patient presents with  . Weakness  . Near Syncope        Assessment & Plan   Principal Problem:   HYPERLIPIDEMIA Active Problems:   OBESITY   HYPERTENSION   CAD   CARDIOMYOPATHY, ISCHEMIC   ATRIAL TACHYCARDIA   C O P D   SLEEP APNEA, OBSTRUCTIVE   Acute renal failure   Hypotension   Chronic blood loss anemia   Atrial fibrillation   Colon cancer   Acute on chronic diastolic heart failure  Colonic mass: -CT abd scan showed Circumferential mass involving the mid transverse colon is identified compatible with colon carcinoma. Severallow-attenuation foci are identified within the liver parenchyma  -Colonoscopy Obstructive mass on 12/19 and Bx showed: INVASIVE ADENOCARCINOMA.   -CT chest: Subpleural nodule in the right upper lobe measures 5 mm.  -Surgery consulted and colectomy and colostomy on 12/23 -Liver MRI did not showed enhancing lesions. Will need follow up with oncology as an outpatient.  -Ambulate patient -Surgery following, patient did not tolerate a clear liquid diet, continues to have nausea and vomiting. -Patient has abdominal fascial dehiscence and underwent exploratory laparotomy with abdominal washout and closure of fascia on 02/18/2013 -Surgery will clamp NGT, and will try sips today  Hx of diastolic CHF as well as ischemic CM/systolic CHF  -EF 45-50% via cath 2013  -Cardiology consulted.  -Additional dose of Lasix has been ordered by cardiology -Continue monitoring daily weights, intake and output  Paroxysmal Atrial fibrillation/SVT  -Cardiology following -Patient currently on diltiazem  gtt. -Once patient can tolerate orals, he will be sore he started on his home regimen.  Acute on Chronic Renal Failure:  -Creatinine 1.32, will continue to monitor -Renal ultrasound showed no hydronephrosis  -May be secondary to medications versus dehydration   Hypotension  -Likely secondary to hypovolemia  -Resolved   Normocytic anemia  -H/H 8.6/28.4 -Multifactorial: GI related vs renal disease.- Patient has received 4units PRBCs this admission  -EGD showed antral erosions and duodenitis.  -Colonoscopy showed: as above.   B12 and folate deficiency  -B12 level Oct 2014 was quit low at 251 - repeated this admit 393 but given anemia, received loading dose  -Folate low so was given IV, PO held due to recent surgery  COPD  -Well compensated at present and not requiring any medication  -Has Xopenex when necessary   Hx of CAD s/p PTCA w/ stents x 2 2010  -Asymptomatic (see above)  -Cardiology following  Obesity - Body mass index is 41.46 kg/(m^2).   Nausea  -Continue zofran as needed  Cough -CXR showed no infiltrate -Wheezing improved -Will continue Xopenex inhaler as well as add Atrovent nebulizer treatments. -Will obtain sputum culture/gram stain  Code Status: Full  Family Communication: None at  bedside  Disposition Plan: Admitted.    Procedures  -EGD showed antral erosions and duodenitis.   -Colonoscopy showed obstructing colon mass, transverse colon   -Echocardiogram 12/21 Left ventricle: The cavity size was mildly dilated. Wall thickness was increased in a pattern of mild LVH. Systolic function was mildly reduced. The estimated ejection fraction was in the range of 45% to 50%.   -Colectomy and colostomy 02/12/2013  -Exploratory laparotomy with abdominal washout and closure of fascia on 02/18/2013   Consults   Gastroenterology Surgery Cardiology  DVT Prophylaxis Lovenox  Lab Results  Component Value Date   PLT 210 02/20/2013     Medications  Scheduled Meds: . enoxaparin (LOVENOX) injection  30 mg Subcutaneous Q24H  . ipratropium  0.5 mg Nebulization TID  . levalbuterol  0.63 mg Nebulization TID  . metoprolol  5 mg Intravenous Q6H  . pantoprazole (PROTONIX) IV  40 mg Intravenous Q24H   Continuous Infusions: . sodium chloride 75 mL/hr (02/18/13 1600)  . diltiazem (CARDIZEM) infusion 5 mg/hr (02/20/13 0500)  . lactated ringers 100 mL/hr at 02/16/13 0108  . lactated ringers 20 mL/hr at 02/18/13 0958   PRN Meds:.acetaminophen, HYDROcodone-acetaminophen, HYDROmorphone (DILAUDID) injection, levalbuterol, ondansetron (ZOFRAN) IV, promethazine  Antibiotics   Anti-infectives   Start     Dose/Rate Route Frequency Ordered Stop   02/18/13 1057  ANCEF 1 gram in 0.9% normal saline 500 mL  Status:  Discontinued       As needed 02/18/13 1057 02/18/13 1152   02/12/13 2330  cefoTEtan (CEFOTAN) 1 g in dextrose 5 % 50 mL IVPB     1 g 100 mL/hr over 30 Minutes Intravenous  Once 02/12/13 1717 02/13/13 0024   02/12/13 0600  [MAR Hold]  cefoTEtan (CEFOTAN) 2 g in dextrose 5 % 50 mL IVPB     (On MAR Hold since 02/12/13 1206)   2 g 100 mL/hr over 30 Minutes Intravenous On call to O.R. 02/11/13 1706 02/12/13 1216       Time Spent in minutes   25 minutes   Lashala Laser D.O. on 02/20/2013 at 7:55 AM  Between 7am to 7pm - Pager - [phone]  After 7pm go to [email or web address] - password [email or web address]  And look for the night coverage person covering for me after hours  [facility] Office  [phone]    Subjective:   Larry [name] seen and examined today. Patient does state his abdominal pain has improved, his nausea and vomiting have also improved. Patient continues to feel some SOB, however states it has improved.    Objective:   Filed Vitals:   02/20/13 0000 02/20/13 0300 02/20/13 0400 02/20/13 0736  BP: 119/69 122/77 124/78   Pulse: 102 100 99   Temp: 99.9 F (37.7 C)  98.1 F (36.7 C)    TempSrc: Oral  Oral   Resp: 14 12 14    Height:      Weight:      SpO2: 98% 97% 99% 93%    Wt Readings from Last 3 Encounters:  02/12/13 135.172 kg (298 lb)  02/12/13 135.172 kg (298 lb)  02/12/13 135.172 kg (298 lb)     Intake/Output Summary (Last 24 hours) at 02/20/13 0755 Last data filed at 02/20/13 0500  Gross per 24 hour  Intake   2130 ml  Output   1325 ml  Net    805 ml    Exam  General: Well developed, well nourished, NAD, appears stated age  HEENT: NCAT, PERRLA, EOMI, Anicteic Sclera, mucous  membranes moist. NG tube in place  Neck: Supple, no JVD, no masses  Cardiovascular: S1 S2 auscultated, no rubs, murmurs or gallops. Regular rate and rhythm.  Respiratory: Expiratory wheezing noted in all lung fields, however improved  Abdomen: Soft, diffusely tender, nondistended, + bowel sounds, ostomy pink/patent, midline incision is clean  Extremities: warm dry without cyanosis clubbing or edema  Neuro: AAOx3, cranial nerves grossly intact.   Skin: Without rashes exudates or nodules  Psych: Normal affect and demeanor with intact judgement and insight  Data Review   Micro Results No results found for this or any previous visit (from the past 240 hour(s)).  Radiology Reports Ct Chest W Contrast  02/08/2013   CLINICAL DATA:  Mass in transverse colon  EXAM: CT CHEST, ABDOMEN, AND PELVIS WITH CONTRAST  TECHNIQUE: Multidetector CT imaging of the chest, abdomen and pelvis was performed following the standard protocol during bolus administration of intravenous contrast.  CONTRAST:  OMNIPAQUE IOHEXOL 300 MG/ML  SOLN  COMPARISON:  100 cc of Omni 300  FINDINGS: CT CHEST FINDINGS  There is no pleural effusion identified. Subpleural nodule within the anterior right upper lobe measures 5 mm, image 33/series 3. Subpleural scar is identified in the right upper lobe, image 28/series 3.  The trachea appears patent and midline. The heart size is normal. Calcified atherosclerotic  disease affects the thoracic aorta as well as the RCA, LAD and left circumflex coronary artery. No pericardial effusion noted. There is no mediastinal or hilar adenopathy. No axillary or supraclavicular adenopathy identified.  Review of the visualized bony structures is significant for mild multilevel thoracic spondylosis.  CT ABDOMEN AND PELVIS FINDINGS  There are several small, indeterminate low attenuation structures within the liver parenchyma. These are too small to characterize. Index hypodensity within the right hepatic lobe measures 6 mm, image number 58/series 2. Near the dome of liver there is a 5 mm hypodensity, image 51/series 2. Posterior right hepatic lobe hypodensity measures 5 mm, image 62/series 2. The gallbladder appears normal. There is no biliary dilatation. Normal appearance of the pancreas. The spleen is unremarkable.  Normal appearance of the adrenal glands. Left renal cyst identified. Bilateral renal hypodensities are noted likely representing cysts. 3.8 cm diverticula arises from the right posterior bladder wall. Calcifications within the prostate gland are identified.  Calcified atherosclerotic disease affects the abdominal aorta. There is no aneurysm. No retroperitoneal adenopathy identified. No pelvic or inguinal adenopathy noted.  Normal appearance of the stomach. The small bowel loops are within normal limits circumferential mass involving the mid transverse colon is identified, image number 82/series 2. This measures approximately 6 cm in length. There is increased caliber of the proximal large bowel suggesting partial obstruction. No ascites or peritoneal nodularity identified.  Review of the visualized osseous structures is significant for mild lumbar spondylosis. No aggressive lytic or sclerotic bone lesions identified.  IMPRESSION: CT chest:  1. Circumferential mass involving the mid transverse colon is identified compatible with colon carcinoma. 2. Subpleural nodule in the right  upper lobe measures 5 mm. 3. Several low-attenuation foci are identified within the liver parenchyma. These are too small to reliably characterize. In a patient with a new diagnosis of colon cancer I would suggest more definitive characterization with contrast enhanced MRI of the liver.   Electronically Signed   By: Signa Kell M.D.   On: 02/08/2013 14:46   Ct Abdomen Pelvis W Contrast  02/08/2013   CLINICAL DATA:  Mass in transverse colon  EXAM: CT CHEST, ABDOMEN,  AND PELVIS WITH CONTRAST  TECHNIQUE: Multidetector CT imaging of the chest, abdomen and pelvis was performed following the standard protocol during bolus administration of intravenous contrast.  CONTRAST:  OMNIPAQUE IOHEXOL 300 MG/ML  SOLN  COMPARISON:  100 cc of Omni 300  FINDINGS: CT CHEST FINDINGS  There is no pleural effusion identified. Subpleural nodule within the anterior right upper lobe measures 5 mm, image 33/series 3. Subpleural scar is identified in the right upper lobe, image 28/series 3.  The trachea appears patent and midline. The heart size is normal. Calcified atherosclerotic disease affects the thoracic aorta as well as the RCA, LAD and left circumflex coronary artery. No pericardial effusion noted. There is no mediastinal or hilar adenopathy. No axillary or supraclavicular adenopathy identified.  Review of the visualized bony structures is significant for mild multilevel thoracic spondylosis.  CT ABDOMEN AND PELVIS FINDINGS  There are several small, indeterminate low attenuation structures within the liver parenchyma. These are too small to characterize. Index hypodensity within the right hepatic lobe measures 6 mm, image number 58/series 2. Near the dome of liver there is a 5 mm hypodensity, image 51/series 2. Posterior right hepatic lobe hypodensity measures 5 mm, image 62/series 2. The gallbladder appears normal. There is no biliary dilatation. Normal appearance of the pancreas. The spleen is unremarkable.  Normal  appearance of the adrenal glands. Left renal cyst identified. Bilateral renal hypodensities are noted likely representing cysts. 3.8 cm diverticula arises from the right posterior bladder wall. Calcifications within the prostate gland are identified.  Calcified atherosclerotic disease affects the abdominal aorta. There is no aneurysm. No retroperitoneal adenopathy identified. No pelvic or inguinal adenopathy noted.  Normal appearance of the stomach. The small bowel loops are within normal limits circumferential mass involving the mid transverse colon is identified, image number 82/series 2. This measures approximately 6 cm in length. There is increased caliber of the proximal large bowel suggesting partial obstruction. No ascites or peritoneal nodularity identified.  Review of the visualized osseous structures is significant for mild lumbar spondylosis. No aggressive lytic or sclerotic bone lesions identified.  IMPRESSION: CT chest:  1. Circumferential mass involving the mid transverse colon is identified compatible with colon carcinoma. 2. Subpleural nodule in the right upper lobe measures 5 mm. 3. Several low-attenuation foci are identified within the liver parenchyma. These are too small to reliably characterize. In a patient with a new diagnosis of colon cancer I would suggest more definitive characterization with contrast enhanced MRI of the liver.   Electronically Signed   By: Signa Kell M.D.   On: 02/08/2013 14:46   US Renal  02/02/2013   CLINICAL DATA:  Renal failure  EXAM: RENAL/URINARY TRACT ULTRASOUND COMPLETE  COMPARISON:  None.  FINDINGS: Right Kidney:  Length: 11.6 cm. Echogenic renal parenchyma, suggesting medical renal disease. 1.4 x 1.1 x 1.2 cm upper pole cyst. No hydronephrosis.  Left Kidney:  Length: 11.7 cm. Echogenic renal parenchyma, suggesting medical renal disease. 2.1 x 2.0 x 2.0 cm upper pole cyst. No hydronephrosis.  Bladder:  Decompressed by indwelling Foley catheter.   IMPRESSION: Echogenic renal parenchyma, suggesting medical renal disease.  Bilateral renal cysts.  No hydronephrosis.   Electronically Signed   By: Charline Bills M.D.   On: 02/02/2013 20:59   Mr Liver W Contrast  02/14/2013   CLINICAL DATA:  Colonic mass, presumably colon cancer, at prior imaging, liver masses on prior CT  EXAM: MRI ABDOMEN WITHOUT CONTRAST  TECHNIQUE: Multiplanar multisequence MR imaging of the abdomen  was performed after the administration of intravenous contrast.  CONTRAST:  10 cc Eovist IV contrast  COMPARISON:  CT 02/08/2013  FINDINGS: Small bilateral pleural effusions are identified. Evidence of midline abdominal incision and diverting right lower quadrant colostomy. Motion artifact is present on multiple series. This essentially obscures detail of the previously described sub cm hepatic lesions, although no enhancement is identified at the largest of these masses measuring 6 mm within the right hepatic lobe, for example image 35 series 503 allowing for motion. On T2 weighted images, the previously seen sub cm hypodense lesions in the right hepatic lobe appear T2 hyperintense. The dome of medial segment left hepatic lobe 3 mm lesion appears T2 hyperintense but is too small for definitive characterization on postcontrast imaging allowing for motion.  Adrenal glands, spleen, pancreas, and gallbladder are normal. No ascites. Bilateral renal cortical cysts are noted.  IMPRESSION: Hepatic T2 hyperintense lesions are identified most compatible with biliary cysts or hamartomas. Because of patient motion, only the largest of these measuring 6 mm in the posterior segment right hepatic lobe can be relatively confidently identified as lacking internal contrast enhancement. The others are too small for postcontrast evaluation due to patient motion. Further workup is as per the overall patient's periodic restaging plan.  Bilateral small pleural effusions.  Interval diverting right lower quadrant  colostomy.   Electronically Signed   By: Christiana Pellant M.D.   On: 02/14/2013 09:09   Dg Chest Portable 1 View  02/02/2013   CLINICAL DATA:  Weakness, presyncope  EXAM: PORTABLE CHEST - 1 VIEW  COMPARISON:  06/16/2011  FINDINGS: Mildly low lung volumes. There is mild elevation the right hemidiaphragm. Cardiac silhouette is within normal limits. There is no evidence of focal infiltrates, effusions, nor edema. Dextroscoliosis identified within the thoracic spine.  IMPRESSION: No evidence of acute cardiopulmonary disease.   Electronically Signed   By: Salome Holmes M.D.   On: 02/02/2013 17:34    CBC  Recent Labs Lab 02/16/13 0512 02/18/13 0347 02/18/13 1721 02/19/13 0820 02/20/13 0400  WBC 5.9 6.3 8.5 6.9 5.5  HGB 8.7* 9.1* 9.4* 9.0* 8.6*  HCT 28.0* 29.0* 30.4* 29.3* 28.4*  PLT 199 208 194 199 210  MCV 89.5 89.2 91.0 90.4 91.6  MCH 27.8 28.0 28.1 27.8 27.7  MCHC 31.1 31.4 30.9 30.7 30.3  RDW 16.9* 16.9* 17.1* 17.6* 17.7*    Chemistries   Recent Labs Lab 02/14/13 0040 02/16/13 0512 02/18/13 0347 02/18/13 1721 02/19/13 0820 02/20/13 0400  NA 140 139 139  --  143 145  K 3.8 3.3* 3.4*  --  4.0 3.9  CL 105 100 104  --  105 107  CO2 26 29 26   --  25 25  GLUCOSE 108* 89 101*  --  90 87  BUN 10 8 7   --  10 10  CREATININE 1.44* 1.16 1.28 1.51* 1.40* 1.32  CALCIUM 8.9 9.2 8.7  --  8.5 8.4  AST 7  --   --   --   --   --   ALT 6  --   --   --   --   --   ALKPHOS 72  --   --   --   --   --   BILITOT 2.1*  --   --   --   --   --    ------------------------------------------------------------------------------------------------------------------ estimated creatinine clearance is 83 ml/min (by C-G formula based on Cr of 1.32). ------------------------------------------------------------------------------------------------------------------ No results found for this  basename: HGBA1C,  in the last 72  hours ------------------------------------------------------------------------------------------------------------------ No results found for this basename: CHOL, HDL, LDLCALC, TRIG, CHOLHDL, LDLDIRECT,  in the last 72 hours ------------------------------------------------------------------------------------------------------------------ No results found for this basename: TSH, T4TOTAL, FREET3, T3FREE, THYROIDAB,  in the last 72 hours ------------------------------------------------------------------------------------------------------------------ No results found for this basename: VITAMINB12, FOLATE, FERRITIN, TIBC, IRON, RETICCTPCT,  in the last 72 hours  Coagulation profile No results found for this basename: INR, PROTIME,  in the last 168 hours  No results found for this basename: DDIMER,  in the last 72 hours  Cardiac Enzymes No results found for this basename: CK, CKMB, TROPONINI, MYOGLOBIN,  in the last 168 hours ------------------------------------------------------------------------------------------------------------------ No components found with this basename: POCBNP,

## 2013-02-20 NOTE — Progress Notes (Signed)
2 Days Post-Op  Subjective: Pt doing well.  Some nausea overnight but better this AM. Con't to pass gas into ostomy.  Objective: Vital signs in last 24 hours: Temp:  [98.1 F (36.7 C)-100.2 F (37.9 C)] 99.2 F (37.3 C) (12/31 0755) Pulse Rate:  [99-108] 99 (12/31 0400) Resp:  [12-19] 14 (12/31 0400) BP: (119-130)/(62-78) 124/78 mmHg (12/31 0400) SpO2:  [93 %-99 %] 93 % (12/31 0736) Last BM Date: 02/17/13  Intake/Output from previous day: 12/30 0701 - 12/31 0700 In: 2130 [P.O.:300; I.V.:1830] Out: 1475 [Urine:1175; Emesis/NG output:300] Intake/Output this shift:    General appearance: alert and cooperative Resp: clear to auscultation bilaterally Cardio: regular rate and rhythm, S1, S2 normal, no murmur, click, rub or gallop GI: soft, NTTP, ND, midline wound c/d/i, ostomy pink and patent  Lab Results:   Recent Labs  02/19/13 0820 02/20/13 0400  WBC 6.9 5.5  HGB 9.0* 8.6*  HCT 29.3* 28.4*  PLT 199 210   BMET  Recent Labs  02/19/13 0820 02/20/13 0400  NA 143 145  K 4.0 3.9  CL 105 107  CO2 25 25  GLUCOSE 90 87  BUN 10 10  CREATININE 1.40* 1.32  CALCIUM 8.5 8.4   PT/INR No results found for this basename: LABPROT, INR,  in the last 72 hours ABG No results found for this basename: PHART, PCO2, PO2, HCO3,  in the last 72 hours  Studies/Results: Dg Chest Port 1 View  02/19/2013   CLINICAL DATA:  Cough, congestion  EXAM: PORTABLE CHEST - 1 VIEW  COMPARISON:  02/18/2013  FINDINGS: The cardiac silhouette is enlarged. Low lung volumes. The lungs are clear. An NG tube is seen tip projecting in the expected region of the stomach, fundal region. The osseous structures are unremarkable.  IMPRESSION: Low lung volumes, cardiomegaly, no evidence of acute cardiopulmonary disease.   Electronically Signed   By: Salome Holmes M.D.   On: 02/19/2013 08:39   Dg Chest Port 1 View  02/18/2013   CLINICAL DATA:  Postop congestion.  EXAM: PORTABLE CHEST - 1 VIEW  COMPARISON:   02/08/2013 CT chest.  02/03/2012 chest x-ray.  FINDINGS: Cardiomegaly.  Pulmonary vascular congestion most notable centrally.  Question basilar subsegmental atelectasis.  CT detected subpleural nodule right upper lobe not evaluated on present exam.  IMPRESSION: Cardiomegaly.  Pulmonary vascular congestion most notable centrally.  Question basilar subsegmental atelectasis.  CT detected subpleural nodule right upper lobe not evaluated on present exam.   Electronically Signed   By: Bridgett Larsson M.D.   On: 02/18/2013 13:13    Anti-infectives: Anti-infectives   Start     Dose/Rate Route Frequency Ordered Stop   02/18/13 1057  ANCEF 1 gram in 0.9% normal saline 500 mL  Status:  Discontinued       As needed 02/18/13 1057 02/18/13 1152   02/12/13 2330  cefoTEtan (CEFOTAN) 1 g in dextrose 5 % 50 mL IVPB     1 g 100 mL/hr over 30 Minutes Intravenous  Once 02/12/13 1717 02/13/13 0024   02/12/13 0600  [MAR Hold]  cefoTEtan (CEFOTAN) 2 g in dextrose 5 % 50 mL IVPB     (On MAR Hold since 02/12/13 1206)   2 g 100 mL/hr over 30 Minutes Intravenous On call to O.R. 02/11/13 1706 02/12/13 1216      Assessment/Plan: s/p Procedure(s): EXPLORATORY LAPAROTOMY/Closure of Wound (N/A) Clamp NGT if no nausea can DC later today and try sips Wound vac to midline wound May be able to  trx to floor in AM.   LOS: 18 days    Marigene Ehlers., [facility] 02/20/2013

## 2013-02-20 NOTE — Consult Note (Addendum)
WOC ostomy follow up Stoma type/location: Colostomy to RUQ from 12/23.  CCS team at bedside to assess stoma.  Requested to apply vac to abd wound when supplies arrive. Stomal assessment/size: Stoma 85% pink, 15% necrotic, 1 3/4 inches and oval, slightly above skin level Peristomal assessment:  Intact skin surrounding Output  No stool or flatus at present, small amt brown drainage in pouch. Ostomy pouching: 2pc.  Education provided: No family at bedside.  Demonstrated pouch change to pt who watched and asked appropriate questions. Will perform further teaching sessions when stable and out of ICU. Enrolled patient in [employer] Discharge program: No Supplies at bedside for staff nurse use.  WOC wound consult note Reason for Consult: Requested to apply Vac to abd wound when supplies arrive. Wound type: Full thickness post-op midline abd wound Measurement:18X8X1.8cm Wound bed: 100% beefy red, sutures visible Drainage (amount, consistency, odor) mod amt yellow drainage, no odor. Periwound:Intact skin surrounding. Dressing procedure/placement/frequency: Moist dressing applied until vac supplies arrive. [address] MSN, RN, [place], [place], [place] [phone]     [phone]

## 2013-02-20 NOTE — Care Management Note (Addendum)
    Page 1 of 2   03/01/2013     3:53:51 PM   CARE MANAGEMENT NOTE 03/01/2013  Patient:  Larry [name], Larry [name]   Account Number:  [id number]  Date Initiated:  02/05/2013  Documentation initiated by:  MAYO,HENRIETTA  Subjective/Objective Assessment:   adm with hypotension, ARF; lives with spouse     Action/Plan:   02/12/2013 Part Colectomy with colostomy.   Anticipated DC Date:     Anticipated DC Plan:        DC Planning Services  CM consult      Choice offered to / List presented to:  C-3 Spouse        HH arranged  HH-1 RN  HH-2 PT      HH agency  [facility]   Status of service:  Completed, signed off Medicare Important Message given?   (If response is "NO", the following Medicare IM given date fields will be blank) Date Medicare IM given:   Date Additional Medicare IM given:    Discharge Disposition:  IP REHAB FACILITY  Per UR Regulation:  Reviewed for med. necessity/level of care/duration of stay  If discussed at Long Length of Stay Meetings, dates discussed:   02/26/2013  02/28/2013    Comments:   03-01-13 1552 Tomi Bamberger, RN, BSN [phone] CIR has accepted pt at d/c. Plan is for d/c today and [facility] will f/u for additional d/c after CIR. No further needs form CM a at this time.  02-28-13 1359 Tomi Bamberger, RN,BSN [phone] CM did speak to family in reference to [facility] services and the plan is to go home with wet to dry dressings. CM will continue to monitor for additional needs.  02/27/13 1020 Henrietta Mayo RN MSN BSN CCM PT recommends home therapy, pt will also need home health RN for wound care-.  WOC RN currently teaching spouse ostomy care, anticipates he will have VAC removed before discharge.  1/6  1506 debbie dowell rn,bsn spoke w pt. no hx of hhc. will cont to follow for dc planning needs. explained role of case Production designer, theatre/television/film.  1/5  1105 debbie dowell rn,bsn left vac form on shadow chart if needed for home vac. will cont to follow  for hhc needs as pt progresses.  02/20/13- 1030- Kristi Webster RN, BSN [phone] Clamp NGT if no nausea can DC later today and try sips--Wound vac to midline wound--- NCM to follow for d/c needs with [facility] and ?wound VAC for home   02/18/13- 1600- Donn Pierini RN, BSN [phone] Back to OR today for Ex lap and washout and closure of fascia.

## 2013-02-20 NOTE — Consult Note (Signed)
WOC wound follow up Refer to previous progress notes for assessment and measurements to abd wound.  Applied one piece black foam to cont suction of Vac therapy.  Pt tolerated with mod amt discomfort and pain meds given.  Plan for bedside nurse to change Q M/W/F. Cammie Mcgee MSN, RN, CWOCN, [place], CNS [phone]

## 2013-02-20 NOTE — Progress Notes (Signed)
Patient has BIPAP ordered PRN per respiratory care protocol.  Patient is resting comfortably on 3L [place] and Bipap is not needed at this time.

## 2013-02-20 NOTE — Progress Notes (Signed)
Physical Therapy Treatment Patient Details Name: Larry [name] MRN: [id number] DOB: [birth year] Today's Date: 02/20/2013 Time: 4098-1191 PT Time Calculation (min): 30 min  PT Assessment / Plan / Recommendation  History of Present Illness Pt went back to OR 12/29 for abd wound dehiscence closure.Pt is a 62 y.o. male adm from home due to generalized weakness.  Patient with colon mass - adenocarcinoma.  Patient s/p colectomy and colostomy 12/23.   PT Comments   Pt con't to have decreased activity tolerance and endurance however overall demo's increased ambulation tolerance. Pt now with wound vac. If pt con't to progress ancitipate pt to be able to return home with family.   Follow Up Recommendations  Home health PT;Supervision/Assistance - 24 hour     Does the patient have the potential to tolerate intense rehabilitation     Barriers to Discharge        Equipment Recommendations  Rolling walker with 5" wheels    Recommendations for Other Services OT consult  Frequency Min 3X/week   Progress towards PT Goals Progress towards PT goals: Progressing toward goals  Plan Current plan remains appropriate    Precautions / Restrictions Precautions Precautions: Fall Precaution Comments: colosoty, wound vac to abdomen, abdominal binder Restrictions Weight Bearing Restrictions: No   Pertinent Vitals/Pain 8/10 abdominal pain    Mobility  Bed Mobility Bed Mobility: Not assessed Transfers Transfers: Sit to Stand;Stand to Sit Sit to Stand: With upper extremity assist;From chair/3-in-1;2: Max assist Stand to Sit: 3: Mod assist;With upper extremity assist;To chair/3-in-1 Details for Transfer Assistance: completed 2 sit to stand transfers, used momentum to assist into standing.  Ambulation/Gait Ambulation/Gait Assistance: 4: Min assist Ambulation Distance (Feet): 50 Feet Assistive device: Rolling walker Ambulation/Gait Assistance Details: pt with onset of fatigue but good walker  management Gait Pattern: Step-through pattern;Decreased stride length Gait velocity: slow    Exercises Total Joint Exercises Long Arc Quad: AROM;Both;10 reps;Seated General Exercises - Lower Extremity Ankle Circles/Pumps: AROM;Both;10 reps;Seated   PT Diagnosis:    PT Problem List:   PT Treatment Interventions:     PT Goals (current goals can now be found in the care plan section) Acute Rehab PT Goals PT Goal Formulation: With patient Time For Goal Achievement: 02/27/13 Potential to Achieve Goals: Good  Visit Information  Last PT Received On: 02/20/13 Assistance Needed: +1 (2nd person helpful for lines/O2 and chair follow) History of Present Illness: Pt went back to OR 12/29 for abd wound dehiscence closure.Pt is a 62 y.o. male adm from home due to generalized weakness.  Patient with colon mass - adenocarcinoma.  Patient s/p colectomy and colostomy 12/23.    Subjective Data      Cognition  Cognition Arousal/Alertness: Awake/alert Behavior During Therapy: WFL for tasks assessed/performed Overall Cognitive Status: Within Functional Limits for tasks assessed    Balance  Static Standing Balance Static Standing - Balance Support: Bilateral upper extremity supported Static Standing - Level of Assistance: 4: Min assist Static Standing - Comment/# of Minutes: stood x 2 min for placement of abdominal binder   End of Session PT - End of Session Equipment Utilized During Treatment: Gait belt Activity Tolerance: Patient limited by fatigue Patient left: in chair;with call bell/phone within reach;with family/visitor present;with nursing/sitter in room Nurse Communication: Mobility status   GP     Marcene Brawn 02/20/2013, 4:19 PM  Lewis Shock, PT, DPT Pager #: [phone] Office #: [phone]

## 2013-02-21 DIAGNOSIS — C189 Malignant neoplasm of colon, unspecified: Secondary | ICD-10-CM

## 2013-02-21 DIAGNOSIS — N179 Acute kidney failure, unspecified: Secondary | ICD-10-CM

## 2013-02-21 HISTORY — DX: Malignant neoplasm of colon, unspecified: C18.9

## 2013-02-21 LAB — GLUCOSE, CAPILLARY
GLUCOSE-CAPILLARY: 84 mg/dL (ref 70–99)
GLUCOSE-CAPILLARY: 87 mg/dL (ref 70–99)
Glucose-Capillary: 81 mg/dL (ref 70–99)
Glucose-Capillary: 74 mg/dL (ref 70–99)
Glucose-Capillary: 78 mg/dL (ref 70–99)
Glucose-Capillary: 81 mg/dL (ref 70–99)

## 2013-02-21 LAB — BASIC METABOLIC PANEL
BUN: 10 mg/dL (ref 6–23)
CO2: 24 mEq/L (ref 19–32)
Calcium: 8.5 mg/dL (ref 8.4–10.5)
Chloride: 106 mEq/L (ref 96–112)
Creatinine, Ser: 1.22 mg/dL (ref 0.50–1.35)
GFR calc Af Amer: 73 mL/min — ABNORMAL LOW (ref >90)
GFR calc non Af Amer: 63 mL/min — ABNORMAL LOW (ref >90)
Glucose, Bld: 83 mg/dL (ref 70–99)
Potassium: 4.1 mEq/L (ref 3.7–5.3)
Sodium: 145 mEq/L (ref 137–147)

## 2013-02-21 LAB — CBC
HCT: 28.2 % — ABNORMAL LOW (ref 39.0–52.0)
Hemoglobin: 8.6 g/dL — ABNORMAL LOW (ref 13.0–17.0)
MCH: 28 pg (ref 26.0–34.0)
MCHC: 30.5 g/dL (ref 30.0–36.0)
MCV: 91.9 fL (ref 78.0–100.0)
Platelets: 224 10*3/uL (ref 150–400)
RBC: 3.07 MIL/uL — ABNORMAL LOW (ref 4.22–5.81)
RDW: 17.6 % — ABNORMAL HIGH (ref 11.5–15.5)
WBC: 4.4 10*3/uL (ref 4.0–10.5)

## 2013-02-21 MED ORDER — SALINE SPRAY 0.65 % NA SOLN
1.0000 | NASAL | Status: DC | PRN
  Filled 2013-02-21: qty 44

## 2013-02-21 MED ORDER — DILTIAZEM HCL ER COATED BEADS 240 MG PO CP24
240.0000 mg | ORAL_CAPSULE | Freq: Every day | ORAL | Status: DC
  Administered 2013-02-22 – 2013-02-23 (×2): 240 mg via ORAL
  Filled 2013-02-21 (×4): qty 1

## 2013-02-21 MED ORDER — DILTIAZEM HCL 100 MG IV SOLR
5.0000 mg/h | INTRAVENOUS | Status: DC
  Administered 2013-02-22: 5 mg/h via INTRAVENOUS
  Filled 2013-02-21: qty 100

## 2013-02-21 MED ORDER — PANTOPRAZOLE SODIUM 40 MG PO TBEC
40.0000 mg | DELAYED_RELEASE_TABLET | Freq: Every day | ORAL | Status: DC
  Administered 2013-02-22 – 2013-02-28 (×7): 40 mg via ORAL
  Filled 2013-02-21 (×8): qty 1

## 2013-02-21 NOTE — Progress Notes (Signed)
I have seen and examined the pt and agree with PA-Osborne's progress note. Will try fulls since not liking taste of CLD tray Maybe to floor in AM

## 2013-02-21 NOTE — Progress Notes (Addendum)
Pt c/o nausea, admin zofran. Pt slept, tried to take PO cardizem to no avail. Paged DR to request continuation of card gtt NS. VSS, will continue to monitor. Paged cardiology on call as well.

## 2013-02-21 NOTE — Progress Notes (Signed)
Patient ID: Larry [name], male   DOB: 1950-04-17, 63 y.o.   MRN: [id number] 3 Days Post-Op  Subjective: Pt feels ok this morning.  C/o congestion of left nostril.  No nausea with NGT out.  Objective: Vital signs in last 24 hours: Temp:  [98.2 F (36.8 C)-100.2 F (37.9 C)] 98.4 F (36.9 C) (01/01 0400) Pulse Rate:  [86-96] 88 (01/01 0400) Resp:  [11-16] 12 (01/01 0400) BP: (106-139)/(70-82) 139/78 mmHg (01/01 0400) SpO2:  [93 %-100 %] 100 % (01/01 0400) Last BM Date: 02/17/13  Intake/Output from previous day: 12/31 0701 - 01/01 0700 In: 1595 [I.V.:1595] Out: 975 [Urine:950; Drains:25] Intake/Output this shift:    PE: Abd: soft, obese, +BS, ostomy with air.  Midline wound with VAC.  Abdominal binder too high, up around chest Heart: regular  Lab Results:   Recent Labs  02/20/13 0400 02/21/13 0455  WBC 5.5 4.4  HGB 8.6* 8.6*  HCT 28.4* 28.2*  PLT 210 224   BMET  Recent Labs  02/20/13 0400 02/21/13 0455  NA 145 145  K 3.9 4.1  CL 107 106  CO2 25 24  GLUCOSE 87 83  BUN 10 10  CREATININE 1.32 1.22  CALCIUM 8.4 8.5   PT/INR No results found for this basename: LABPROT, INR,  in the last 72 hours CMP     Component Value Date/Time   NA 145 02/21/2013 0455   K 4.1 02/21/2013 0455   CL 106 02/21/2013 0455   CO2 24 02/21/2013 0455   GLUCOSE 83 02/21/2013 0455   BUN 10 02/21/2013 0455   CREATININE 1.22 02/21/2013 0455   CALCIUM 8.5 02/21/2013 0455   PROT 6.0 02/14/2013 0040   ALBUMIN 2.5* 02/14/2013 0040   AST 7 02/14/2013 0040   ALT 6 02/14/2013 0040   ALKPHOS 72 02/14/2013 0040   BILITOT 2.1* 02/14/2013 0040   GFRNONAA 63* 02/21/2013 0455   GFRAA 73* 02/21/2013 0455   Lipase     Component Value Date/Time   LIPASE 15 11/05/2008 1215       Studies/Results: Dg Chest Port 1 View  02/19/2013   CLINICAL DATA:  Cough, congestion  EXAM: PORTABLE CHEST - 1 VIEW  COMPARISON:  02/18/2013  FINDINGS: The cardiac silhouette is enlarged. Low lung volumes. The lungs are  clear. An NG tube is seen tip projecting in the expected region of the stomach, fundal region. The osseous structures are unremarkable.  IMPRESSION: Low lung volumes, cardiomegaly, no evidence of acute cardiopulmonary disease.   Electronically Signed   By: Margaree Mackintosh M.D.   On: 02/19/2013 08:39    Anti-infectives: Anti-infectives   Start     Dose/Rate Route Frequency Ordered Stop   02/18/13 1057  ANCEF 1 gram in 0.9% normal saline 500 mL  Status:  Discontinued       As needed 02/18/13 1057 02/18/13 1152   02/12/13 2330  cefoTEtan (CEFOTAN) 1 g in dextrose 5 % 50 mL IVPB     1 g 100 mL/hr over 30 Minutes Intravenous  Once 02/12/13 1717 02/13/13 0024   02/12/13 0600  [MAR Hold]  cefoTEtan (CEFOTAN) 2 g in dextrose 5 % 50 mL IVPB     (On MAR Hold since 02/12/13 1206)   2 g 100 mL/hr over 30 Minutes Intravenous On call to O.R. 02/11/13 1706 02/12/13 1216       Assessment/Plan: 1. Obstructing colon mass  2. POD #9 S/P EXPLORATORY LAPAROTOMY PARTIAL COLECTOMY WITH COLOSTOMY (Transverse colectomy with colostomy and long Hartman's  pouch) 02/12/2013, [name], MD. POD3 from abdominal washout and closure from dehiscence  3. Acute on chronic renal failure on admit (creatinine now 1.28)  4. Anemia with GI blood loss.  5. CAD with CM; EF 45-50% 05/2011.  6. Hx of 2 RCA stents 2010./hx of atrial tachycardia/NSVT on BB and CCB's.  7. COPD/tobacco use Hx.  8. Hx of ETOH heavy use 20+ years, quit 2010.  10. Dyslipidemia  11. Deconditioning/malnutrition  13. Abdominal fascial dehiscence, resolved  Plan: 1. Will give clear liquids today.   2. May leave abdominal binder off while laying in bed.  Needs to have it on when up moving.  Try to find a binder that is a size bigger. 3. Cont mobilization and pulm toilet  4. Will need oncology follow up as an outpatient for stage 4 adenocarcinoma of colon 5. Cont wound VAC, likely will not go home with this.  Change on M,W,F   LOS: 19 days     Melecio Cueto E 02/21/2013, 7:57 AM Pager: [phone]

## 2013-02-21 NOTE — Progress Notes (Signed)
[facility]                                                                                Patient Demographics  Larry [name], is a 63 y.o. male, DOB - [birth year], [id number]  Admit date - 02/02/2013   Admitting Physician Etta Quill, DO  Outpatient Primary MD for the patient is No primary provider on file.  LOS - 59   Chief Complaint  Patient presents with  . Weakness  . Near Syncope        Assessment & Plan   Principal Problem:   HYPERLIPIDEMIA Active Problems:   OBESITY   HYPERTENSION   CAD   CARDIOMYOPATHY, ISCHEMIC   ATRIAL TACHYCARDIA   C O P D   SLEEP APNEA, OBSTRUCTIVE   Acute renal failure   Hypotension   Chronic blood loss anemia   Atrial fibrillation   Colon cancer   Acute on chronic diastolic heart failure  Colonic mass: -CT abd scan showed Circumferential mass involving the mid transverse colon is identified compatible with colon carcinoma. Severallow-attenuation foci are identified within the liver parenchyma  -Colonoscopy Obstructive mass on 12/19 and Bx showed: INVASIVE ADENOCARCINOMA.   -CT chest: Subpleural nodule in the right upper lobe measures 5 mm.  -Surgery consulted and colectomy and colostomy on 12/23 -Liver MRI did not showed enhancing lesions. Will need follow up with oncology as an outpatient.  -Ambulate patient -Surgery following, patient did not tolerate a clear liquid diet, continues to have nausea and vomiting. -Patient has abdominal fascial dehiscence and underwent exploratory laparotomy with abdominal washout and closure of fascia on 02/18/2013 -NGTube removed, transitioned to liquid diet  Hx of diastolic CHF as well as ischemic CM/systolic CHF  -EF [id number] via cath 2013  -Cardiology consulted.  -Continue monitoring daily weights, intake and output  Paroxysmal Atrial fibrillation/SVT  -Cardiology following -Patient currently on diltiazem gtt, will wean off and start pill form, Diltiazem CD 240mg   daily  Acute on Chronic Renal Failure:  -Creatinine 1.22, will continue to monitor -Renal ultrasound showed no hydronephrosis  -May be secondary to medications versus dehydration   Hypotension  -Likely secondary to hypovolemia  -Resolved   Normocytic anemia  -H/H 8.6/28.2 -Multifactorial: GI related vs renal disease.- Patient has received 4units PRBCs this admission  -EGD showed antral erosions and duodenitis.  -Colonoscopy showed: as above.   B12 and folate deficiency  -B12 level Oct 2014 was quit low at 251 - repeated this admit 393 but given anemia, received loading dose  -Folate low so was given IV, PO held due to recent surgery  COPD  -Well compensated at present and not requiring any medication  -Has Xopenex when necessary   Hx of CAD s/p PTCA w/ stents x 2 2010  -Asymptomatic (see above)  -Cardiology following -will restart home meds once patient is able to tolerate PO  Obesity - Body mass index is 41.46 kg/(m^2).   Nausea  -Continue zofran as needed  Cough -CXR showed no infiltrate -Wheezing improved -Will continue Xopenex inhaler as well as add Atrovent nebulizer treatments. -Pending sputum culture/gram stain -Will add on nasal spray for congestion  Code Status: Full  Family Communication: None  at bedside  Disposition Plan: Admitted.    Procedures  -EGD showed antral erosions and duodenitis.   -Colonoscopy showed obstructing colon mass, transverse colon   -Echocardiogram 12/21 Left ventricle: The cavity size was mildly dilated. Wall thickness was increased in a pattern of mild LVH. Systolic function was mildly reduced. The estimated ejection fraction was in the range of 45% to 50%.   -Colectomy and colostomy 02/12/2013  -Exploratory laparotomy with abdominal washout and closure of fascia on 02/18/2013   Consults   Gastroenterology Surgery Cardiology  DVT Prophylaxis Lovenox  Lab Results  Component Value Date   PLT 224 02/21/2013     Medications  Scheduled Meds: . enoxaparin (LOVENOX) injection  60 mg Subcutaneous Q24H  . ipratropium  0.5 mg Nebulization TID  . levalbuterol  0.63 mg Nebulization TID  . metoprolol  5 mg Intravenous Q6H  . pantoprazole (PROTONIX) IV  40 mg Intravenous Q24H   Continuous Infusions: . sodium chloride 75 mL/hr at 02/20/13 1152  . diltiazem (CARDIZEM) infusion 5 mg/hr (02/21/13 [id number])  . lactated ringers 100 mL/hr at 02/16/13 0108  . lactated ringers 20 mL/hr at 02/18/13 0958   PRN Meds:.acetaminophen, HYDROcodone-acetaminophen, HYDROmorphone (DILAUDID) injection, levalbuterol, ondansetron (ZOFRAN) IV, promethazine, sodium chloride  Antibiotics   Anti-infectives   Start     Dose/Rate Route Frequency Ordered Stop   02/18/13 1057  ANCEF 1 gram in 0.9% normal saline 500 mL  Status:  Discontinued       As needed 02/18/13 1057 02/18/13 1152   02/12/13 2330  cefoTEtan (CEFOTAN) 1 g in dextrose 5 % 50 mL IVPB     1 g 100 mL/hr over 30 Minutes Intravenous  Once 02/12/13 1717 02/13/13 0024   02/12/13 0600  [MAR Hold]  cefoTEtan (CEFOTAN) 2 g in dextrose 5 % 50 mL IVPB     (On MAR Hold since 02/12/13 1206)   2 g 100 mL/hr over 30 Minutes Intravenous On call to O.R. 02/11/13 1706 02/12/13 1216       Time Spent in minutes   25 minutes   Tanvir Hipple D.O. on 02/21/2013 at 11:32 AM  Between 7am to 7pm - Pager - [phone]  After 7pm go to [email or web address] - password [email or web address]  And look for the night coverage person covering for me after hours  [facility] Office  [phone]    Subjective:   Amarion Reeves seen and examined today. Patient does not complaining of abdominal pain, nausea, vomiting. He does continue to complain of coughing and congestion.  Objective:   Filed Vitals:   02/21/13 0800 02/21/13 0900 02/21/13 0922 02/21/13 1127  BP: 133/84 124/85  120/73  Pulse: 88 87  91  Temp: 98 F (36.7 C)   98.1 F (36.7 C)  TempSrc: Oral   Oral  Resp: 14 14   14   Height:      Weight:      SpO2: 100% 98% 92% 100%    Wt Readings from Last 3 Encounters:  02/12/13 135.172 kg (298 lb)  02/12/13 135.172 kg (298 lb)  02/12/13 135.172 kg (298 lb)     Intake/Output Summary (Last 24 hours) at 02/21/13 1132 Last data filed at 02/21/13 1128  Gross per 24 hour  Intake   1515 ml  Output   1175 ml  Net    340 ml    Exam  General: Well developed, well nourished, NAD, appears stated age  HEENT: NCAT, PERRLA, EOMI, Anicteic Sclera, mucous membranes moist. NG tube in place  Neck: Supple, no JVD, no masses  Cardiovascular: S1 S2 auscultated, no rubs, murmurs or gallops. Regular rate and rhythm.  Respiratory: Expiratory wheezing noted in all lung fields, however improved  Abdomen: Soft, diffusely tender, nondistended, + bowel sounds, ostomy pink/patent, midline incision is clean, wound VAC in place  Extremities: warm dry without cyanosis clubbing or edema  Neuro: AAOx3, cranial nerves grossly intact.   Skin: Without rashes exudates or nodules  Psych: Normal affect and demeanor with intact judgement and insight  Data Review   Micro Results Recent Results (from the past 240 hour(s))  CULTURE, EXPECTORATED SPUTUM-ASSESSMENT     Status: None   Collection Time    02/20/13  6:18 PM      Result Value Range Status   Specimen Description SPUTUM   Final   Special Requests NONE   Final   Sputum evaluation     Final   Value: THIS SPECIMEN IS ACCEPTABLE. RESPIRATORY CULTURE REPORT TO FOLLOW.   Report Status 02/20/2013 FINAL   Final  CULTURE, RESPIRATORY (NON-EXPECTORATED)     Status: None   Collection Time    02/20/13  6:18 PM      Result Value Range Status   Specimen Description SPUTUM   Final   Special Requests NONE   Final   Gram Stain     Final   Value: FEW WBC PRESENT, PREDOMINANTLY PMN     FEW SQUAMOUS EPITHELIAL CELLS PRESENT     FEW GRAM NEGATIVE RODS     FEW GRAM POSITIVE COCCI IN PAIRS     Performed at [facility]    Culture PENDING   Incomplete   Report Status PENDING   Incomplete    Radiology Reports Ct Chest W Contrast  02/08/2013   CLINICAL DATA:  Mass in transverse colon  EXAM: CT CHEST, ABDOMEN, AND PELVIS WITH CONTRAST  TECHNIQUE: Multidetector CT imaging of the chest, abdomen and pelvis was performed following the standard protocol during bolus administration of intravenous contrast.  CONTRAST:  170mL OMNIPAQUE IOHEXOL 300 MG/ML  SOLN  COMPARISON:  100 cc of Omni 300  FINDINGS: CT CHEST FINDINGS  There is no pleural effusion identified. Subpleural nodule within the anterior right upper lobe measures 5 mm, image 33/series 3. Subpleural scar is identified in the right upper lobe, image 28/series 3.  The trachea appears patent and midline. The heart size is normal. Calcified atherosclerotic disease affects the thoracic aorta as well as the RCA, LAD and left circumflex coronary artery. No pericardial effusion noted. There is no mediastinal or hilar adenopathy. No axillary or supraclavicular adenopathy identified.  Review of the visualized bony structures is significant for mild multilevel thoracic spondylosis.  CT ABDOMEN AND PELVIS FINDINGS  There are several small, indeterminate low attenuation structures within the liver parenchyma. These are too small to characterize. Index hypodensity within the right hepatic lobe measures 6 mm, image number 58/series 2. Near the dome of liver there is a 5 mm hypodensity, image 51/series 2. Posterior right hepatic lobe hypodensity measures 5 mm, image 62/series 2. The gallbladder appears normal. There is no biliary dilatation. Normal appearance of the pancreas. The spleen is unremarkable.  Normal appearance of the adrenal glands. Left renal cyst identified. Bilateral renal hypodensities are noted likely representing cysts. 3.8 cm diverticula arises from the right posterior bladder wall. Calcifications within the prostate gland are identified.  Calcified atherosclerotic disease  affects the abdominal aorta. There is no aneurysm. No retroperitoneal adenopathy identified. No pelvic or inguinal adenopathy noted.  Normal appearance of the stomach. The small bowel loops are within normal limits circumferential mass involving the mid transverse colon is identified, image number 82/series 2. This measures approximately 6 cm in length. There is increased caliber of the proximal large bowel suggesting partial obstruction. No ascites or peritoneal nodularity identified.  Review of the visualized osseous structures is significant for mild lumbar spondylosis. No aggressive lytic or sclerotic bone lesions identified.  IMPRESSION: CT chest:  1. Circumferential mass involving the mid transverse colon is identified compatible with colon carcinoma. 2. Subpleural nodule in the right upper lobe measures 5 mm. 3. Several low-attenuation foci are identified within the liver parenchyma. These are too small to reliably characterize. In a patient with a new diagnosis of colon cancer I would suggest more definitive characterization with contrast enhanced MRI of the liver.   Electronically Signed   By: Kerby Moors M.D.   On: 02/08/2013 14:46   Ct Abdomen Pelvis W Contrast  02/08/2013   CLINICAL DATA:  Mass in transverse colon  EXAM: CT CHEST, ABDOMEN, AND PELVIS WITH CONTRAST  TECHNIQUE: Multidetector CT imaging of the chest, abdomen and pelvis was performed following the standard protocol during bolus administration of intravenous contrast.  CONTRAST:  155mL OMNIPAQUE IOHEXOL 300 MG/ML  SOLN  COMPARISON:  100 cc of Omni 300  FINDINGS: CT CHEST FINDINGS  There is no pleural effusion identified. Subpleural nodule within the anterior right upper lobe measures 5 mm, image 33/series 3. Subpleural scar is identified in the right upper lobe, image 28/series 3.  The trachea appears patent and midline. The heart size is normal. Calcified atherosclerotic disease affects the thoracic aorta as well as the RCA, LAD and  left circumflex coronary artery. No pericardial effusion noted. There is no mediastinal or hilar adenopathy. No axillary or supraclavicular adenopathy identified.  Review of the visualized bony structures is significant for mild multilevel thoracic spondylosis.  CT ABDOMEN AND PELVIS FINDINGS  There are several small, indeterminate low attenuation structures within the liver parenchyma. These are too small to characterize. Index hypodensity within the right hepatic lobe measures 6 mm, image number 58/series 2. Near the dome of liver there is a 5 mm hypodensity, image 51/series 2. Posterior right hepatic lobe hypodensity measures 5 mm, image 62/series 2. The gallbladder appears normal. There is no biliary dilatation. Normal appearance of the pancreas. The spleen is unremarkable.  Normal appearance of the adrenal glands. Left renal cyst identified. Bilateral renal hypodensities are noted likely representing cysts. 3.8 cm diverticula arises from the right posterior bladder wall. Calcifications within the prostate gland are identified.  Calcified atherosclerotic disease affects the abdominal aorta. There is no aneurysm. No retroperitoneal adenopathy identified. No pelvic or inguinal adenopathy noted.  Normal appearance of the stomach. The small bowel loops are within normal limits circumferential mass involving the mid transverse colon is identified, image number 82/series 2. This measures approximately 6 cm in length. There is increased caliber of the proximal large bowel suggesting partial obstruction. No ascites or peritoneal nodularity identified.  Review of the visualized osseous structures is significant for mild lumbar spondylosis. No aggressive lytic or sclerotic bone lesions identified.  IMPRESSION: CT chest:  1. Circumferential mass involving the mid transverse colon is identified compatible with colon carcinoma. 2. Subpleural nodule in the right upper lobe measures 5 mm. 3. Several low-attenuation foci are  identified within the liver parenchyma. These are too small to reliably characterize. In a patient with a new diagnosis of colon cancer I would suggest  more definitive characterization with contrast enhanced MRI of the liver.   Electronically Signed   By: Kerby Moors M.D.   On: 02/08/2013 14:46   US Renal  02/02/2013   CLINICAL DATA:  Renal failure  EXAM: RENAL/URINARY TRACT ULTRASOUND COMPLETE  COMPARISON:  None.  FINDINGS: Right Kidney:  Length: 11.6 cm. Echogenic renal parenchyma, suggesting medical renal disease. 1.4 x 1.1 x 1.2 cm upper pole cyst. No hydronephrosis.  Left Kidney:  Length: 11.7 cm. Echogenic renal parenchyma, suggesting medical renal disease. 2.1 x 2.0 x 2.0 cm upper pole cyst. No hydronephrosis.  Bladder:  Decompressed by indwelling Foley catheter.  IMPRESSION: Echogenic renal parenchyma, suggesting medical renal disease.  Bilateral renal cysts.  No hydronephrosis.   Electronically Signed   By: Julian Hy M.D.   On: 02/02/2013 20:59   Mr Liver W Contrast  02/14/2013   CLINICAL DATA:  Colonic mass, presumably colon cancer, at prior imaging, liver masses on prior CT  EXAM: MRI ABDOMEN WITHOUT CONTRAST  TECHNIQUE: Multiplanar multisequence MR imaging of the abdomen was performed after the administration of intravenous contrast.  CONTRAST:  10 cc Eovist IV contrast  COMPARISON:  CT 02/08/2013  FINDINGS: Small bilateral pleural effusions are identified. Evidence of midline abdominal incision and diverting right lower quadrant colostomy. Motion artifact is present on multiple series. This essentially obscures detail of the previously described sub cm hepatic lesions, although no enhancement is identified at the largest of these masses measuring 6 mm within the right hepatic lobe, for example image 35 series 503 allowing for motion. On T2 weighted images, the previously seen sub cm hypodense lesions in the right hepatic lobe appear T2 hyperintense. The dome of medial segment left  hepatic lobe 3 mm lesion appears T2 hyperintense but is too small for definitive characterization on postcontrast imaging allowing for motion.  Adrenal glands, spleen, pancreas, and gallbladder are normal. No ascites. Bilateral renal cortical cysts are noted.  IMPRESSION: Hepatic T2 hyperintense lesions are identified most compatible with biliary cysts or hamartomas. Because of patient motion, only the largest of these measuring 6 mm in the posterior segment right hepatic lobe can be relatively confidently identified as lacking internal contrast enhancement. The others are too small for postcontrast evaluation due to patient motion. Further workup is as per the overall patient's periodic restaging plan.  Bilateral small pleural effusions.  Interval diverting right lower quadrant colostomy.   Electronically Signed   By: Conchita Paris M.D.   On: 02/14/2013 09:09   Dg Chest Portable 1 View  02/02/2013   CLINICAL DATA:  Weakness, presyncope  EXAM: PORTABLE CHEST - 1 VIEW  COMPARISON:  06/16/2011  FINDINGS: Mildly low lung volumes. There is mild elevation the right hemidiaphragm. Cardiac silhouette is within normal limits. There is no evidence of focal infiltrates, effusions, nor edema. Dextroscoliosis identified within the thoracic spine.  IMPRESSION: No evidence of acute cardiopulmonary disease.   Electronically Signed   By: Margaree Mackintosh M.D.   On: 02/02/2013 17:34    CBC  Recent Labs Lab 02/18/13 0347 02/18/13 1721 02/19/13 0820 02/20/13 0400 02/21/13 0455  WBC 6.3 8.5 6.9 5.5 4.4  HGB 9.1* 9.4* 9.0* 8.6* 8.6*  HCT 29.0* 30.4* 29.3* 28.4* 28.2*  PLT 208 194 199 210 224  MCV 89.2 91.0 90.4 91.6 91.9  MCH 28.0 28.1 27.8 27.7 28.0  MCHC 31.4 30.9 30.7 30.3 30.5  RDW 16.9* 17.1* 17.6* 17.7* 17.6*    Chemistries   Recent Labs Lab 02/16/13 0512 02/18/13 0347  02/18/13 1721 02/19/13 0820 02/20/13 0400 02/21/13 0455  NA 139 139  --  143 145 145  K 3.3* 3.4*  --  4.0 3.9 4.1  CL 100 104   --  105 107 106  CO2 29 26  --  25 25 24   GLUCOSE 89 101*  --  90 87 83  BUN 8 7  --  10 10 10   CREATININE 1.16 1.28 1.51* 1.40* 1.32 1.22  CALCIUM 9.2 8.7  --  8.5 8.4 8.5   ------------------------------------------------------------------------------------------------------------------ estimated creatinine clearance is 89.8 ml/min (by C-G formula based on Cr of 1.22). ------------------------------------------------------------------------------------------------------------------ No results found for this basename: HGBA1C,  in the last 72 hours ------------------------------------------------------------------------------------------------------------------ No results found for this basename: CHOL, HDL, LDLCALC, TRIG, CHOLHDL, LDLDIRECT,  in the last 72 hours ------------------------------------------------------------------------------------------------------------------ No results found for this basename: TSH, T4TOTAL, FREET3, T3FREE, THYROIDAB,  in the last 72 hours ------------------------------------------------------------------------------------------------------------------ No results found for this basename: VITAMINB12, FOLATE, FERRITIN, TIBC, IRON, RETICCTPCT,  in the last 72 hours  Coagulation profile No results found for this basename: INR, PROTIME,  in the last 168 hours  No results found for this basename: DDIMER,  in the last 72 hours  Cardiac Enzymes No results found for this basename: CK, CKMB, TROPONINI, MYOGLOBIN,  in the last 168 hours ------------------------------------------------------------------------------------------------------------------ No components found with this basename: POCBNP,

## 2013-02-22 LAB — CBC
HEMATOCRIT: 31 % — AB (ref 39.0–52.0)
HEMOGLOBIN: 9.5 g/dL — AB (ref 13.0–17.0)
MCH: 27.9 pg (ref 26.0–34.0)
MCHC: 30.6 g/dL (ref 30.0–36.0)
MCV: 90.9 fL (ref 78.0–100.0)
Platelets: 252 10*3/uL (ref 150–400)
RBC: 3.41 MIL/uL — ABNORMAL LOW (ref 4.22–5.81)
RDW: 17.3 % — AB (ref 11.5–15.5)
WBC: 4.4 10*3/uL (ref 4.0–10.5)

## 2013-02-22 LAB — BASIC METABOLIC PANEL
BUN: 7 mg/dL (ref 6–23)
CHLORIDE: 104 meq/L (ref 96–112)
CO2: 22 mEq/L (ref 19–32)
Calcium: 8.7 mg/dL (ref 8.4–10.5)
Creatinine, Ser: 1.01 mg/dL (ref 0.50–1.35)
GFR calc non Af Amer: 79 mL/min — ABNORMAL LOW (ref >90)
GLUCOSE: 96 mg/dL (ref 70–99)
POTASSIUM: 3.6 meq/L — AB (ref 3.7–5.3)
Sodium: 142 mEq/L (ref 137–147)

## 2013-02-22 MED ORDER — IPRATROPIUM BROMIDE 0.02 % IN SOLN: 0.5000 mg | Freq: Four times a day (QID) | RESPIRATORY_TRACT | Status: DC | PRN

## 2013-02-22 MED ORDER — POTASSIUM CHLORIDE 20 MEQ/15ML (10%) PO LIQD
40.0000 meq | Freq: Every day | ORAL | Status: DC
  Administered 2013-02-22 – 2013-02-25 (×3): 40 meq via ORAL
  Filled 2013-02-22 (×4): qty 30

## 2013-02-22 NOTE — Progress Notes (Signed)
RT Note: Pt refused breathing treatment, states feeling nauseous. VS WNL, RT to monitor.

## 2013-02-22 NOTE — Progress Notes (Signed)
Foley d/c, wound vac & ostomy pouch changed, VSS, will continue to monitor.

## 2013-02-22 NOTE — Progress Notes (Addendum)
Patient ID: MARQUAN VOKES, male   DOB: 07/07/1950, 63 y.o.   MRN: [id number] 4 Days Post-Op  Subjective: Pt c/o nausea today.  Had one small episode of emesis earlier this morning.   Objective: Vital signs in last 24 hours: Temp:  [97.4 F (36.3 C)-98.3 F (36.8 C)] 97.9 F (36.6 C) (01/02 0440) Pulse Rate:  [87-95] 95 (01/02 0440) Resp:  [14-17] 17 (01/02 0440) BP: (120-149)/(73-91) 149/91 mmHg (01/02 0440) SpO2:  [92 %-100 %] 98 % (01/02 0440) Last BM Date: 02/21/13  Intake/Output from previous day: 01/01 0701 - 01/02 0700 In: 2500 [P.O.:580; I.V.:1920] Out: 1700 [Urine:1475; Drains:50; Stool:175] Intake/Output this shift:    PE: Abd: more distended today, great BS, ostomy with air and just emptied 125cc of liquid stool, incision intact with wound VAC in place Heart: regular Lungs: exp wheeze and rhonchi noted  Lab Results:   Recent Labs  02/21/13 0455 02/22/13 0529  WBC 4.4 4.4  HGB 8.6* 9.5*  HCT 28.2* 31.0*  PLT 224 252   BMET  Recent Labs  02/21/13 0455 02/22/13 0529  NA 145 142  K 4.1 3.6*  CL 106 104  CO2 24 22  GLUCOSE 83 96  BUN 10 7  CREATININE 1.22 1.01  CALCIUM 8.5 8.7   PT/INR No results found for this basename: LABPROT, INR,  in the last 72 hours CMP     Component Value Date/Time   NA 142 02/22/2013 0529   K 3.6* 02/22/2013 0529   CL 104 02/22/2013 0529   CO2 22 02/22/2013 0529   GLUCOSE 96 02/22/2013 0529   BUN 7 02/22/2013 0529   CREATININE 1.01 02/22/2013 0529   CALCIUM 8.7 02/22/2013 0529   PROT 6.0 02/14/2013 0040   ALBUMIN 2.5* 02/14/2013 0040   AST 7 02/14/2013 0040   ALT 6 02/14/2013 0040   ALKPHOS 72 02/14/2013 0040   BILITOT 2.1* 02/14/2013 0040   GFRNONAA 79* 02/22/2013 0529   GFRAA >90 02/22/2013 0529   Lipase     Component Value Date/Time   LIPASE 15 11/05/2008 1215       Studies/Results: No results found.  Anti-infectives: Anti-infectives   Start     Dose/Rate Route Frequency Ordered Stop   02/18/13 1057  ANCEF 1  gram in 0.9% normal saline 500 mL  Status:  Discontinued       As needed 02/18/13 1057 02/18/13 1152   02/12/13 2330  cefoTEtan (CEFOTAN) 1 g in dextrose 5 % 50 mL IVPB     1 g 100 mL/hr over 30 Minutes Intravenous  Once 02/12/13 1717 02/13/13 0024   02/12/13 0600  [MAR Hold]  cefoTEtan (CEFOTAN) 2 g in dextrose 5 % 50 mL IVPB     (On MAR Hold since 02/12/13 1206)   2 g 100 mL/hr over 30 Minutes Intravenous On call to O.R. 02/11/13 1706 02/12/13 1216       Assessment/Plan  1. POD 10/4 from Hartman's procedure with later washout and closure for dehiscence 2. Post op ileus 3. Stage 2 colonic adenocarcinoma Patient Active Problem List   Diagnosis Date Noted  . Acute on chronic diastolic heart failure 44/04/4740  . Colon cancer 02/09/2013  . Atrial fibrillation 02/05/2013  . Acute renal failure 02/02/2013  . Hypotension 02/02/2013  . Chronic blood loss anemia 02/02/2013  . Edema 06/16/2011  . Tachycardia 06/16/2011  . IMPOTENCE OF ORGANIC ORIGIN 01/12/2009  . SLEEP APNEA, OBSTRUCTIVE 12/04/2008  . HYPERLIPIDEMIA 12/03/2008  . OBESITY 12/03/2008  . CAD  12/03/2008  . CARDIOMYOPATHY, ISCHEMIC 12/03/2008  . ATRIAL TACHYCARDIA 12/03/2008  . ASTHMA 12/03/2008  . C O P D 12/03/2008  . HYPERTENSION 12/02/2008    Plan: 1. Will make NPO x ice chips and sips due to abdominal distention and nausea with minimal emesis.  He has good bowel sounds and is putting some air and liquid out of his colostomy.  Cont to encourage mobilization around the unit and with PT 2. Wound VAC scheduled to be changed today.  LOS: 20 days    Alfredo Collymore E 02/22/2013, 7:40 AM Pager: [phone]

## 2013-02-22 NOTE — Progress Notes (Signed)
Utilization review completed.  

## 2013-02-22 NOTE — Progress Notes (Signed)
[facility]                                                                                Patient Demographics  Larry [name], is a 63 y.o. male, DOB - 1950/02/27, RFF:[id number]  Admit date - 02/02/2013   Admitting Physician Etta Quill, DO  Outpatient Primary MD for the patient is No primary provider on file.  LOS - 68   Chief Complaint  Patient presents with  . Weakness  . Near Syncope        Assessment & Plan   Principal Problem:   HYPERLIPIDEMIA Active Problems:   OBESITY   HYPERTENSION   CAD   CARDIOMYOPATHY, ISCHEMIC   ATRIAL TACHYCARDIA   C O P D   SLEEP APNEA, OBSTRUCTIVE   Acute renal failure   Hypotension   Chronic blood loss anemia   Atrial fibrillation   Colon cancer   Acute on chronic diastolic heart failure  Colonic mass, s/p colectomy, post ileus -CT abd scan showed Circumferential mass involving the mid transverse colon is identified compatible with colon carcinoma. Severallow-attenuation foci are identified within the liver parenchyma  -Colonoscopy Obstructive mass on 12/19 and Bx showed: INVASIVE ADENOCARCINOMA.   -CT chest: Subpleural nodule in the right upper lobe measures 5 mm.  -Surgery consulted and colectomy and colostomy on 12/23 -Liver MRI did not showed enhancing lesions. Will need follow up with oncology as an outpatient.  -Ambulate patient -Surgery following, patient did not tolerate a clear liquid diet, continues to have nausea and vomiting. -Patient has abdominal fascial dehiscence and underwent exploratory laparotomy with abdominal washout and closure of fascia on 02/18/2013 -NGTube removed -Patient c/o nausea, made NPO by surgery, likely ileus  Hx of diastolic CHF as well as ischemic CM/systolic CHF  -EF 35-70% via cath 2013  -Cardiology consulted.  -Continue monitoring daily weights, intake and output  Paroxysmal Atrial fibrillation/SVT  -Cardiology following -Patient currently on diltiazem gtt, will wean off  and start pill form,Diltiazem CD 240mg  daily when able to tolerated PO  Acute on Chronic Renal Failure:  -Creatinine 1.01, will continue to monitor -Renal ultrasound showed no hydronephrosis  -May be secondary to medications versus dehydration   Hypotension  -Likely secondary to hypovolemia  -Resolved   Normocytic anemia  -H/H 9.5/31 -Multifactorial: GI related vs renal disease. -Patient has received 4units PRBCs this admission  -EGD showed antral erosions and duodenitis.  -Colonoscopy showed: as above.   B12 and folate deficiency  -B12 level Oct 2014 was quit low at 251  -repeated this admit 393 but given anemia, received loading dose  -Folate low so was given IV, PO held due to recent surgery  COPD  -Well compensated at present and not requiring any medication  -Has Xopenex when necessary   Hx of CAD s/p PTCA w/ stents x 2 2010  -Asymptomatic (see above)  -Cardiology following -will restart home meds once patient is able to tolerate PO  Obesity - Body mass index is 41.46 kg/(m^2).   Nausea  -Continue zofran as needed  Cough -CXR showed no infiltrate -Wheezing improved -Will continue Xopenex inhaler as well as add Atrovent nebulizer treatments, nasal spray -Pending sputum culture/gram stain  Code Status: Full  Family Communication: None at bedside  Disposition Plan: Admitted.    Procedures  -EGD showed antral erosions and duodenitis.   -Colonoscopy showed obstructing colon mass, transverse colon   -Echocardiogram 12/21 Left ventricle: The cavity size was mildly dilated. Wall thickness was increased in a pattern of mild LVH. Systolic function was mildly reduced. The estimated ejection fraction was in the range of 45% to 50%.   -Colectomy and colostomy 02/12/2013  -Exploratory laparotomy with abdominal washout and closure of fascia on 02/18/2013  Consults   Gastroenterology Surgery Cardiology  DVT Prophylaxis Lovenox  Lab Results  Component Value  Date   PLT 252 02/22/2013    Medications  Scheduled Meds: . diltiazem  240 mg Oral Daily  . enoxaparin (LOVENOX) injection  60 mg Subcutaneous Q24H  . ipratropium  0.5 mg Nebulization TID  . levalbuterol  0.63 mg Nebulization TID  . metoprolol  5 mg Intravenous Q6H  . pantoprazole  40 mg Oral QHS  . potassium chloride  40 mEq Oral Daily   Continuous Infusions: . sodium chloride 75 mL/hr at 02/22/13 0400  . diltiazem (CARDIZEM) infusion 5 mg/hr (02/22/13 0600)  . lactated ringers 100 mL/hr at 02/16/13 0108  . lactated ringers 20 mL/hr at 02/18/13 0958   PRN Meds:.acetaminophen, HYDROcodone-acetaminophen, HYDROmorphone (DILAUDID) injection, levalbuterol, ondansetron (ZOFRAN) IV, promethazine, sodium chloride  Antibiotics   Anti-infectives   Start     Dose/Rate Route Frequency Ordered Stop   02/18/13 1057  ANCEF 1 gram in 0.9% normal saline 500 mL  Status:  Discontinued       As needed 02/18/13 1057 02/18/13 1152   02/12/13 2330  cefoTEtan (CEFOTAN) 1 g in dextrose 5 % 50 mL IVPB     1 g 100 mL/hr over 30 Minutes Intravenous  Once 02/12/13 1717 02/13/13 0024   02/12/13 0600  [MAR Hold]  cefoTEtan (CEFOTAN) 2 g in dextrose 5 % 50 mL IVPB     (On MAR Hold since 02/12/13 1206)   2 g 100 mL/hr over 30 Minutes Intravenous On call to O.R. 02/11/13 1706 02/12/13 1216       Time Spent in minutes   25 minutes   Aslyn Cottman D.O. on 02/22/2013 at 11:14 AM  Between 7am to 7pm - Pager - [phone]  After 7pm go to [email or web address] - password [email or web address]  And look for the night coverage person covering for me after hours  [facility] Office  [phone]    Subjective:   Larry [name] seen and examined today. Patient complains of some abdominal distention, nausea and one episode of vomiting this morning.    Objective:   Filed Vitals:   02/21/13 2151 02/21/13 2345 02/22/13 0440 02/22/13 0800  BP:  141/91 149/91 141/85  Pulse: 95 89 95   Temp: 97.4 F (36.3 C)  98.3 F (36.8 C) 97.9 F (36.6 C) 98.6 F (37 C)  TempSrc: Oral Oral Oral Oral  Resp:  16 17   Height:      Weight:      SpO2: 99% 100% 98%     Wt Readings from Last 3 Encounters:  02/12/13 135.172 kg (298 lb)  02/12/13 135.172 kg (298 lb)  02/12/13 135.172 kg (298 lb)     Intake/Output Summary (Last 24 hours) at 02/22/13 1114 Last data filed at 02/22/13 0800  Gross per 24 hour  Intake   2180 ml  Output   1450 ml  Net    730 ml  Exam  General: Well developed, well nourished, NAD, appears stated age  HEENT: NCAT, PERRLA, EOMI, Anicteic Sclera, mucous membranes moist.   Neck: Supple, no JVD, no masses  Cardiovascular: S1 S2 auscultated, no rubs, murmurs or gallops. Regular rate and rhythm.  Respiratory: Expiratory wheezing noted in all lung fields, however improved  Abdomen: Soft, diffusely tender, nondistended, + bowel sounds, ostomy pink/patent, midline incision is clean, wound VAC in place  Extremities: warm dry without cyanosis clubbing or edema  Neuro: AAOx3, cranial nerves grossly intact.   Skin: Without rashes exudates or nodules  Psych: Normal affect and demeanor with intact judgement and insight  Data Review   Micro Results Recent Results (from the past 240 hour(s))  CULTURE, EXPECTORATED SPUTUM-ASSESSMENT     Status: None   Collection Time    02/20/13  6:18 PM      Result Value Range Status   Specimen Description SPUTUM   Final   Special Requests NONE   Final   Sputum evaluation     Final   Value: THIS SPECIMEN IS ACCEPTABLE. RESPIRATORY CULTURE REPORT TO FOLLOW.   Report Status 02/20/2013 FINAL   Final  CULTURE, RESPIRATORY (NON-EXPECTORATED)     Status: None   Collection Time    02/20/13  6:18 PM      Result Value Range Status   Specimen Description SPUTUM   Final   Special Requests NONE   Final   Gram Stain     Final   Value: FEW WBC PRESENT, PREDOMINANTLY PMN     FEW SQUAMOUS EPITHELIAL CELLS PRESENT     FEW GRAM NEGATIVE RODS      FEW GRAM POSITIVE COCCI IN PAIRS     Performed at [facility]   Culture     Final   Value: NORMAL OROPHARYNGEAL FLORA     Performed at [facility]   Report Status PENDING   Incomplete    Radiology Reports Ct Chest W Contrast  02/08/2013   CLINICAL DATA:  Mass in transverse colon  EXAM: CT CHEST, ABDOMEN, AND PELVIS WITH CONTRAST  TECHNIQUE: Multidetector CT imaging of the chest, abdomen and pelvis was performed following the standard protocol during bolus administration of intravenous contrast.  CONTRAST:  160mL OMNIPAQUE IOHEXOL 300 MG/ML  SOLN  COMPARISON:  100 cc of Omni 300  FINDINGS: CT CHEST FINDINGS  There is no pleural effusion identified. Subpleural nodule within the anterior right upper lobe measures 5 mm, image 33/series 3. Subpleural scar is identified in the right upper lobe, image 28/series 3.  The trachea appears patent and midline. The heart size is normal. Calcified atherosclerotic disease affects the thoracic aorta as well as the RCA, LAD and left circumflex coronary artery. No pericardial effusion noted. There is no mediastinal or hilar adenopathy. No axillary or supraclavicular adenopathy identified.  Review of the visualized bony structures is significant for mild multilevel thoracic spondylosis.  CT ABDOMEN AND PELVIS FINDINGS  There are several small, indeterminate low attenuation structures within the liver parenchyma. These are too small to characterize. Index hypodensity within the right hepatic lobe measures 6 mm, image number 58/series 2. Near the dome of liver there is a 5 mm hypodensity, image 51/series 2. Posterior right hepatic lobe hypodensity measures 5 mm, image 62/series 2. The gallbladder appears normal. There is no biliary dilatation. Normal appearance of the pancreas. The spleen is unremarkable.  Normal appearance of the adrenal glands. Left renal cyst identified. Bilateral renal hypodensities are noted likely representing cysts. 3.8  cm diverticula  arises from the right posterior bladder wall. Calcifications within the prostate gland are identified.  Calcified atherosclerotic disease affects the abdominal aorta. There is no aneurysm. No retroperitoneal adenopathy identified. No pelvic or inguinal adenopathy noted.  Normal appearance of the stomach. The small bowel loops are within normal limits circumferential mass involving the mid transverse colon is identified, image number 82/series 2. This measures approximately 6 cm in length. There is increased caliber of the proximal large bowel suggesting partial obstruction. No ascites or peritoneal nodularity identified.  Review of the visualized osseous structures is significant for mild lumbar spondylosis. No aggressive lytic or sclerotic bone lesions identified.  IMPRESSION: CT chest:  1. Circumferential mass involving the mid transverse colon is identified compatible with colon carcinoma. 2. Subpleural nodule in the right upper lobe measures 5 mm. 3. Several low-attenuation foci are identified within the liver parenchyma. These are too small to reliably characterize. In a patient with a new diagnosis of colon cancer I would suggest more definitive characterization with contrast enhanced MRI of the liver.   Electronically Signed   By: Kerby Moors M.D.   On: 02/08/2013 14:46   Ct Abdomen Pelvis W Contrast  02/08/2013   CLINICAL DATA:  Mass in transverse colon  EXAM: CT CHEST, ABDOMEN, AND PELVIS WITH CONTRAST  TECHNIQUE: Multidetector CT imaging of the chest, abdomen and pelvis was performed following the standard protocol during bolus administration of intravenous contrast.  CONTRAST:  161mL OMNIPAQUE IOHEXOL 300 MG/ML  SOLN  COMPARISON:  100 cc of Omni 300  FINDINGS: CT CHEST FINDINGS  There is no pleural effusion identified. Subpleural nodule within the anterior right upper lobe measures 5 mm, image 33/series 3. Subpleural scar is identified in the right upper lobe, image 28/series 3.  The trachea  appears patent and midline. The heart size is normal. Calcified atherosclerotic disease affects the thoracic aorta as well as the RCA, LAD and left circumflex coronary artery. No pericardial effusion noted. There is no mediastinal or hilar adenopathy. No axillary or supraclavicular adenopathy identified.  Review of the visualized bony structures is significant for mild multilevel thoracic spondylosis.  CT ABDOMEN AND PELVIS FINDINGS  There are several small, indeterminate low attenuation structures within the liver parenchyma. These are too small to characterize. Index hypodensity within the right hepatic lobe measures 6 mm, image number 58/series 2. Near the dome of liver there is a 5 mm hypodensity, image 51/series 2. Posterior right hepatic lobe hypodensity measures 5 mm, image 62/series 2. The gallbladder appears normal. There is no biliary dilatation. Normal appearance of the pancreas. The spleen is unremarkable.  Normal appearance of the adrenal glands. Left renal cyst identified. Bilateral renal hypodensities are noted likely representing cysts. 3.8 cm diverticula arises from the right posterior bladder wall. Calcifications within the prostate gland are identified.  Calcified atherosclerotic disease affects the abdominal aorta. There is no aneurysm. No retroperitoneal adenopathy identified. No pelvic or inguinal adenopathy noted.  Normal appearance of the stomach. The small bowel loops are within normal limits circumferential mass involving the mid transverse colon is identified, image number 82/series 2. This measures approximately 6 cm in length. There is increased caliber of the proximal large bowel suggesting partial obstruction. No ascites or peritoneal nodularity identified.  Review of the visualized osseous structures is significant for mild lumbar spondylosis. No aggressive lytic or sclerotic bone lesions identified.  IMPRESSION: CT chest:  1. Circumferential mass involving the mid transverse colon is  identified compatible with colon carcinoma. 2.  Subpleural nodule in the right upper lobe measures 5 mm. 3. Several low-attenuation foci are identified within the liver parenchyma. These are too small to reliably characterize. In a patient with a new diagnosis of colon cancer I would suggest more definitive characterization with contrast enhanced MRI of the liver.   Electronically Signed   By: Kerby Moors M.D.   On: 02/08/2013 14:46   US Renal  02/02/2013   CLINICAL DATA:  Renal failure  EXAM: RENAL/URINARY TRACT ULTRASOUND COMPLETE  COMPARISON:  None.  FINDINGS: Right Kidney:  Length: 11.6 cm. Echogenic renal parenchyma, suggesting medical renal disease. 1.4 x 1.1 x 1.2 cm upper pole cyst. No hydronephrosis.  Left Kidney:  Length: 11.7 cm. Echogenic renal parenchyma, suggesting medical renal disease. 2.1 x 2.0 x 2.0 cm upper pole cyst. No hydronephrosis.  Bladder:  Decompressed by indwelling Foley catheter.  IMPRESSION: Echogenic renal parenchyma, suggesting medical renal disease.  Bilateral renal cysts.  No hydronephrosis.   Electronically Signed   By: Julian Hy M.D.   On: 02/02/2013 20:59   Mr Liver W Contrast  02/14/2013   CLINICAL DATA:  Colonic mass, presumably colon cancer, at prior imaging, liver masses on prior CT  EXAM: MRI ABDOMEN WITHOUT CONTRAST  TECHNIQUE: Multiplanar multisequence MR imaging of the abdomen was performed after the administration of intravenous contrast.  CONTRAST:  10 cc Eovist IV contrast  COMPARISON:  CT 02/08/2013  FINDINGS: Small bilateral pleural effusions are identified. Evidence of midline abdominal incision and diverting right lower quadrant colostomy. Motion artifact is present on multiple series. This essentially obscures detail of the previously described sub cm hepatic lesions, although no enhancement is identified at the largest of these masses measuring 6 mm within the right hepatic lobe, for example image 35 series 503 allowing for motion. On T2  weighted images, the previously seen sub cm hypodense lesions in the right hepatic lobe appear T2 hyperintense. The dome of medial segment left hepatic lobe 3 mm lesion appears T2 hyperintense but is too small for definitive characterization on postcontrast imaging allowing for motion.  Adrenal glands, spleen, pancreas, and gallbladder are normal. No ascites. Bilateral renal cortical cysts are noted.  IMPRESSION: Hepatic T2 hyperintense lesions are identified most compatible with biliary cysts or hamartomas. Because of patient motion, only the largest of these measuring 6 mm in the posterior segment right hepatic lobe can be relatively confidently identified as lacking internal contrast enhancement. The others are too small for postcontrast evaluation due to patient motion. Further workup is as per the overall patient's periodic restaging plan.  Bilateral small pleural effusions.  Interval diverting right lower quadrant colostomy.   Electronically Signed   By: Conchita Paris M.D.   On: 02/14/2013 09:09   Dg Chest Portable 1 View  02/02/2013   CLINICAL DATA:  Weakness, presyncope  EXAM: PORTABLE CHEST - 1 VIEW  COMPARISON:  06/16/2011  FINDINGS: Mildly low lung volumes. There is mild elevation the right hemidiaphragm. Cardiac silhouette is within normal limits. There is no evidence of focal infiltrates, effusions, nor edema. Dextroscoliosis identified within the thoracic spine.  IMPRESSION: No evidence of acute cardiopulmonary disease.   Electronically Signed   By: Margaree Mackintosh M.D.   On: 02/02/2013 17:34    CBC  Recent Labs Lab 02/18/13 1721 02/19/13 0820 02/20/13 0400 02/21/13 0455 02/22/13 0529  WBC 8.5 6.9 5.5 4.4 4.4  HGB 9.4* 9.0* 8.6* 8.6* 9.5*  HCT 30.4* 29.3* 28.4* 28.2* 31.0*  PLT 194 199 210 224 252  MCV 91.0 90.4 91.6 91.9 90.9  MCH 28.1 27.8 27.7 28.0 27.9  MCHC 30.9 30.7 30.3 30.5 30.6  RDW 17.1* 17.6* 17.7* 17.6* 17.3*    Chemistries   Recent Labs Lab 02/18/13 0347  02/18/13 1721 02/19/13 0820 02/20/13 0400 02/21/13 0455 02/22/13 0529  NA 139  --  143 145 145 142  K 3.4*  --  4.0 3.9 4.1 3.6*  CL 104  --  105 107 106 104  CO2 26  --  25 25 24 22   GLUCOSE 101*  --  90 87 83 96  BUN 7  --  10 10 10 7   CREATININE 1.28 1.51* 1.40* 1.32 1.22 1.01  CALCIUM 8.7  --  8.5 8.4 8.5 8.7   ------------------------------------------------------------------------------------------------------------------ estimated creatinine clearance is 108.5 ml/min (by C-G formula based on Cr of 1.01). ------------------------------------------------------------------------------------------------------------------ No results found for this basename: HGBA1C,  in the last 72 hours ------------------------------------------------------------------------------------------------------------------ No results found for this basename: CHOL, HDL, LDLCALC, TRIG, CHOLHDL, LDLDIRECT,  in the last 72 hours ------------------------------------------------------------------------------------------------------------------ No results found for this basename: TSH, T4TOTAL, FREET3, T3FREE, THYROIDAB,  in the last 72 hours ------------------------------------------------------------------------------------------------------------------ No results found for this basename: VITAMINB12, FOLATE, FERRITIN, TIBC, IRON, RETICCTPCT,  in the last 72 hours  Coagulation profile No results found for this basename: INR, PROTIME,  in the last 168 hours  No results found for this basename: DDIMER,  in the last 72 hours  Cardiac Enzymes No results found for this basename: CK, CKMB, TROPONINI, MYOGLOBIN,  in the last 168 hours ------------------------------------------------------------------------------------------------------------------ No components found with this basename: POCBNP,

## 2013-02-22 NOTE — Progress Notes (Signed)
Physical Therapy Treatment Patient Details Name: Larry [name] MRN: [id number] DOB: 05-25-1950 Today's Date: 02/22/2013 Time: 2992-4268 PT Time Calculation (min): 23 min  PT Assessment / Plan / Recommendation  History of Present Illness Pt went back to OR 12/29 for abd wound dehiscence closure.Pt is a 63 y.o. male adm from home due to generalized weakness.  Patient with colon mass - adenocarcinoma.  Patient s/p colectomy and colostomy 12/23.  On 12/29, patient s/p abd wound dehissance closure with VAC.   PT Comments   Patient making progress with mobility and gait.  Follow Up Recommendations  Home health PT;Supervision/Assistance - 24 hour     Does the patient have the potential to tolerate intense rehabilitation     Barriers to Discharge        Equipment Recommendations  Rolling walker with 5" wheels    Recommendations for Other Services OT consult  Frequency Min 3X/week   Progress towards PT Goals Progress towards PT goals: Progressing toward goals  Plan Current plan remains appropriate    Precautions / Restrictions Precautions Precautions: Fall Precaution Comments: colostomy, wound VAC, abdominal binder Restrictions Weight Bearing Restrictions: No   Pertinent Vitals/Pain     Mobility  Bed Mobility Bed Mobility: Supine to Sit;Sitting - Scoot to Edge of Bed Supine to Sit: 4: Min assist;With rails Sitting - Scoot to [employer] of Bed: 4: Min guard Details for Bed Mobility Assistance: Verbal cues for technique.  Assist to bring trunk to sitting position. Transfers Transfers: Sit to Stand;Stand to Sit Sit to Stand: 1: +2 Total assist;From elevated surface;With upper extremity assist;From bed Sit to Stand: Patient Percentage: 70% Stand to Sit: 3: Mod assist;With upper extremity assist;With armrests;To chair/3-in-1 Details for Transfer Assistance: Verbal cues for hand placement.  Assist to rise to standing and for balance initially. Ambulation/Gait Ambulation/Gait  Assistance: 4: Min assist (+1 for lines/O2) Ambulation Distance (Feet): 70 Feet Assistive device: Rolling walker Ambulation/Gait Assistance Details: Verbal cues to stand upright and look forward during gait.  Slightly unsteady gait. Gait Pattern: Step-through pattern;Decreased stride length;Trunk flexed Gait velocity: slow      PT Goals (current goals can now be found in the care plan section)    Visit Information  Last PT Received On: 02/22/13 Assistance Needed: +2 (+1 for lines/chair) History of Present Illness: Pt went back to OR 12/29 for abd wound dehiscence closure.Pt is a 63 y.o. male adm from home due to generalized weakness.  Patient with colon mass - adenocarcinoma.  Patient s/p colectomy and colostomy 12/23.  On 12/29, patient s/p abd wound dehissance closure with VAC.    Subjective Data  Subjective: "I'll go today"   Cognition  Cognition Arousal/Alertness: Awake/alert Behavior During Therapy: WFL for tasks assessed/performed Overall Cognitive Status: Within Functional Limits for tasks assessed    Balance     End of Session PT - End of Session Equipment Utilized During Treatment: Gait belt;Oxygen (Abd binder) Activity Tolerance: Patient limited by fatigue Patient left: in chair;with call bell/phone within reach Nurse Communication: Mobility status   GP     Despina Pole 02/22/2013, 4:09 PM Carita Pian. Sanjuana Kava, [place] Pager [phone]

## 2013-02-22 NOTE — Progress Notes (Signed)
Slow improvement. Still getting over post-operative ileus.  Imogene Burn. Georgette Dover, MD, [facility] Surgery  General/ Trauma Surgery  02/22/2013 10:22 AM

## 2013-02-23 ENCOUNTER — Inpatient Hospital Stay (HOSPITAL_COMMUNITY): Payer: Medicare Other

## 2013-02-23 LAB — BASIC METABOLIC PANEL
BUN: 5 mg/dL — ABNORMAL LOW (ref 6–23)
CO2: 22 mEq/L (ref 19–32)
CREATININE: 0.98 mg/dL (ref 0.50–1.35)
Calcium: 8.5 mg/dL (ref 8.4–10.5)
Chloride: 105 mEq/L (ref 96–112)
GFR, EST NON AFRICAN AMERICAN: 88 mL/min — AB (ref >90)
Glucose, Bld: 89 mg/dL (ref 70–99)
Potassium: 3.5 mEq/L — ABNORMAL LOW (ref 3.7–5.3)
Sodium: 142 mEq/L (ref 137–147)

## 2013-02-23 LAB — CULTURE, RESPIRATORY W GRAM STAIN: Culture: NORMAL

## 2013-02-23 LAB — CULTURE, RESPIRATORY

## 2013-02-23 NOTE — Progress Notes (Signed)
5 Days Post-Op  Subjective: Feels ok, still distended, no n/v  Objective: Vital signs in last 24 hours: Temp:  [97.5 F (36.4 C)-98.9 F (37.2 C)] 98.9 F (37.2 C) (01/03 0738) Pulse Rate:  [83-103] 83 (01/03 0738) Resp:  [13-19] 13 (01/03 0738) BP: (120-145)/(74-90) 131/81 mmHg (01/03 0738) SpO2:  [95 %-100 %] 95 % (01/03 0738) Last BM Date: 02/22/13  Intake/Output from previous day: 01/02 0701 - 01/03 0700 In: 2403.4 [P.O.:480; I.V.:1923.4] Out: 2125 [Urine:1300; Drains:50; Stool:775] Intake/Output this shift: Total I/O In: 75 [I.V.:75] Out: 260 [Urine:250; Drains:10]  General appearance: no distress GI: distended, miminally tender, vac in place, ostomy with liquid stool and gas in bag  Lab Results:   Recent Labs  02/21/13 0455 02/22/13 0529  WBC 4.4 4.4  HGB 8.6* 9.5*  HCT 28.2* 31.0*  PLT 224 252   BMET  Recent Labs  02/22/13 0529 02/23/13 0750  NA 142 142  K 3.6* 3.5*  CL 104 105  CO2 22 22  GLUCOSE 96 89  BUN 7 5*  CREATININE 1.01 0.98  CALCIUM 8.7 8.5    Anti-infectives: Anti-infectives   Start     Dose/Rate Route Frequency Ordered Stop   02/18/13 1057  ANCEF 1 gram in 0.9% normal saline 500 mL  Status:  Discontinued       As needed 02/18/13 1057 02/18/13 1152   02/12/13 2330  cefoTEtan (CEFOTAN) 1 g in dextrose 5 % 50 mL IVPB     1 g 100 mL/hr over 30 Minutes Intravenous  Once 02/12/13 1717 02/13/13 0024   02/12/13 0600  [MAR Hold]  cefoTEtan (CEFOTAN) 2 g in dextrose 5 % 50 mL IVPB     (On MAR Hold since 02/12/13 1206)   2 g 100 mL/hr over 30 Minutes Intravenous On call to O.R. 02/11/13 1706 02/12/13 1216      Assessment/Plan: 1. POD 11/5 from Hartman's procedure with later washout and closure for dehiscence  2. Post op ileus  3. Stage 4 colonic adenocarcinoma   Will check film today, keep sips/chips now  [facility] 02/23/2013

## 2013-02-23 NOTE — Progress Notes (Signed)
Pt transferred to 6E17 per MD order. Report called to receiving nurse and all questions answered.

## 2013-02-23 NOTE — Progress Notes (Signed)
[facility]                                                                                Patient Demographics  Larry [name], is a 63 y.o. male, DOB - 1950/05/11, FY:[id number]  Admit date - 02/02/2013   Admitting Physician Etta Quill, DO  Outpatient Primary MD for the patient is No primary provider on file.  LOS - 21   Chief Complaint  Patient presents with  . Weakness  . Near Syncope     Interim History:  63 year old male who presents to the emergency room for generalized weakness for approximately 4 weeks. Patient was also noted to have a chronic anemia. Patient was found to have acute renal failure on admission which has since improved. Patient with cirrhosis he Dr. On 12/26/2012 however this was canceled as patient was not feeling well. GI was consulted in the hospital. Colonoscopy did be placed showing a colonic mass, followed by CT scan which did show a mass with possible liver metastasis.  Patient had wound dehiscence, was then taken back to the OR. He currently has some postop ileus. Patient was unable to tolerate liquid diet, he is now on sips and chips for now. Surgery still following.  Assessment & Plan   Principal Problem:   HYPERLIPIDEMIA Active Problems:   OBESITY   HYPERTENSION   CAD   CARDIOMYOPATHY, ISCHEMIC   ATRIAL TACHYCARDIA   C O P D   SLEEP APNEA, OBSTRUCTIVE   Acute renal failure   Hypotension   Chronic blood loss anemia   Atrial fibrillation   Colon cancer   Acute on chronic diastolic heart failure  Colonic mass, s/p colectomy, post ileus -CT abd scan showed Circumferential mass involving the mid transverse colon is identified compatible with colon carcinoma. Severallow-attenuation foci are identified within the liver parenchyma  -Colonoscopy Obstructive mass on 12/19 and Bx showed: INVASIVE ADENOCARCINOMA.   -CT chest: Subpleural nodule in the right upper lobe measures 5 mm.  -Surgery consulted and colectomy and colostomy on  12/23 -Liver MRI did not showed enhancing lesions. Will need follow up with oncology as an outpatient.  -Ambulate patient -Surgery following, patient did not tolerate a clear liquid diet, continues to have nausea and vomiting. -Patient has abdominal fascial dehiscence and underwent exploratory laparotomy with abdominal washout and closure of fascia on 02/18/2013 -NGTube removed -Continue sips/chips per surgery  Hx of diastolic CHF as well as ischemic CM/systolic CHF  -EF [id number] via cath 2013  -Cardiology consulted.  -Continue monitoring daily weights, intake and output  Paroxysmal Atrial fibrillation/SVT  -Cardiology following -Continue Diltiazem CD 240mg  daily   Acute on Chronic Renal Failure:  -Creatinine 0.98, will continue to monitor -Renal ultrasound showed no hydronephrosis  -May be secondary to medications versus dehydration   Hypotension  -Likely secondary to hypovolemia  -Resolved   Normocytic anemia  -H/H 9.5/31 -Multifactorial: GI related vs renal disease. -Patient has received 4units PRBCs this admission  -EGD showed antral erosions and duodenitis.  -Colonoscopy showed: as above.   B12 and folate deficiency  -B12 level Oct 2014 was quit low at 251  -repeated this admit 393 but given anemia, received loading dose  -  Folate low so was given IV, PO held due to recent surgery  COPD  -Well compensated at present and not requiring any medication  -Has Xopenex when necessary   Hx of CAD s/p PTCA w/ stents x 2 2010  -Asymptomatic (see above)  -Cardiology following -will restart home meds once patient is able to tolerate PO  Obesity - Body mass index is 41.46 kg/(m^2).   Nausea  -Continue zofran as needed  Cough -CXR showed no infiltrate -Wheezing improved -Will continue Xopenex inhaler as well as add Atrovent nebulizer treatments, nasal spray -Pending sputum culture/gram stain  Code Status: Full  Family Communication: None at bedside  Disposition Plan:  Admitted.    Procedures  -EGD showed antral erosions and duodenitis.   -Colonoscopy showed obstructing colon mass, transverse colon   -Echocardiogram 12/21 Left ventricle: The cavity size was mildly dilated. Wall thickness was increased in a pattern of mild LVH. Systolic function was mildly reduced. The estimated ejection fraction was in the range of 45% to 50%.   -Colectomy and colostomy 02/12/2013  -Exploratory laparotomy with abdominal washout and closure of fascia on 02/18/2013  Consults   Gastroenterology Surgery Cardiology  DVT Prophylaxis Lovenox  Lab Results  Component Value Date   PLT 252 02/22/2013    Medications  Scheduled Meds: . diltiazem  240 mg Oral Daily  . enoxaparin (LOVENOX) injection  60 mg Subcutaneous Q24H  . metoprolol  5 mg Intravenous Q6H  . pantoprazole  40 mg Oral QHS  . potassium chloride  40 mEq Oral Daily   Continuous Infusions: . sodium chloride 75 mL/hr at 02/23/13 0400  . diltiazem (CARDIZEM) infusion Stopped (02/22/13 1541)  . lactated ringers 100 mL/hr at 02/16/13 0108  . lactated ringers 20 mL/hr at 02/18/13 0958   PRN Meds:.acetaminophen, HYDROcodone-acetaminophen, HYDROmorphone (DILAUDID) injection, ipratropium, levalbuterol, ondansetron (ZOFRAN) IV, promethazine, sodium chloride  Antibiotics   Anti-infectives   Start     Dose/Rate Route Frequency Ordered Stop   02/18/13 1057  ANCEF 1 gram in 0.9% normal saline 500 mL  Status:  Discontinued       As needed 02/18/13 1057 02/18/13 1152   02/12/13 2330  cefoTEtan (CEFOTAN) 1 g in dextrose 5 % 50 mL IVPB     1 g 100 mL/hr over 30 Minutes Intravenous  Once 02/12/13 1717 02/13/13 0024   02/12/13 0600  [MAR Hold]  cefoTEtan (CEFOTAN) 2 g in dextrose 5 % 50 mL IVPB     (On MAR Hold since 02/12/13 1206)   2 g 100 mL/hr over 30 Minutes Intravenous On call to O.R. 02/11/13 1706 02/12/13 1216       Time Spent in minutes   25 minutes   Tahjir Silveria D.O. on 02/23/2013 at 10:47  AM  Between 7am to 7pm - Pager - [phone]  After 7pm go to [email or web address] - password [email or web address]  And look for the night coverage person covering for me after hours  [facility] Office  [phone]    Subjective:   Chiron Umsted seen and examined today. Patient states he is doing well and that is "feeling good."  He denies nausea at this time and his SOB has improved.   Objective:   Filed Vitals:   02/22/13 1945 02/22/13 2325 02/23/13 0345 02/23/13 0738  BP: 134/82 120/82 145/90 131/81  Pulse: 87 86 103 83  Temp: 98.4 F (36.9 C) 98.6 F (37 C) 97.7 F (36.5 C) 98.9 F (37.2 C)  TempSrc: Oral Oral Oral Oral  Resp:  13 14 16 13   Height:      Weight:      SpO2: 97% 98% 97% 95%    Wt Readings from Last 3 Encounters:  02/12/13 135.172 kg (298 lb)  02/12/13 135.172 kg (298 lb)  02/12/13 135.172 kg (298 lb)     Intake/Output Summary (Last 24 hours) at 02/23/13 1047 Last data filed at 02/23/13 0820  Gross per 24 hour  Intake 2318.42 ml  Output   2385 ml  Net -66.58 ml    Exam  General: Well developed, well nourished, NAD, appears stated age  HEENT: NCAT, PERRLA, EOMI, Anicteic Sclera, mucous membranes moist.   Neck: Supple, no JVD, no masses  Cardiovascular: S1 S2 auscultated, no rubs, murmurs or gallops. Regular rate and rhythm.  Respiratory: Expiratory wheezing noted in all lung fields, however improved  Abdomen: Soft, mildly tender, nondistended, + bowel sounds, ostomy with liquid stool/gas, wound VAC in place  Extremities: warm dry without cyanosis clubbing or edema  Neuro: AAOx3, cranial nerves grossly intact.   Skin: Without rashes exudates or nodules  Psych: Normal affect and demeanor with intact judgement and insight  Data Review   Micro Results Recent Results (from the past 240 hour(s))  CULTURE, EXPECTORATED SPUTUM-ASSESSMENT     Status: None   Collection Time    02/20/13  6:18 PM      Result Value Range Status   Specimen  Description SPUTUM   Final   Special Requests NONE   Final   Sputum evaluation     Final   Value: THIS SPECIMEN IS ACCEPTABLE. RESPIRATORY CULTURE REPORT TO FOLLOW.   Report Status 02/20/2013 FINAL   Final  CULTURE, RESPIRATORY (NON-EXPECTORATED)     Status: None   Collection Time    02/20/13  6:18 PM      Result Value Range Status   Specimen Description SPUTUM   Final   Special Requests NONE   Final   Gram Stain     Final   Value: FEW WBC PRESENT, PREDOMINANTLY PMN     FEW SQUAMOUS EPITHELIAL CELLS PRESENT     FEW GRAM NEGATIVE RODS     FEW GRAM POSITIVE COCCI IN PAIRS     Performed at [facility]   Culture     Final   Value: NORMAL OROPHARYNGEAL FLORA     Performed at [facility]   Report Status PENDING   Incomplete    Radiology Reports Ct Chest W Contrast  02/08/2013   CLINICAL DATA:  Mass in transverse colon  EXAM: CT CHEST, ABDOMEN, AND PELVIS WITH CONTRAST  TECHNIQUE: Multidetector CT imaging of the chest, abdomen and pelvis was performed following the standard protocol during bolus administration of intravenous contrast.  CONTRAST:  125mL OMNIPAQUE IOHEXOL 300 MG/ML  SOLN  COMPARISON:  100 cc of Omni 300  FINDINGS: CT CHEST FINDINGS  There is no pleural effusion identified. Subpleural nodule within the anterior right upper lobe measures 5 mm, image 33/series 3. Subpleural scar is identified in the right upper lobe, image 28/series 3.  The trachea appears patent and midline. The heart size is normal. Calcified atherosclerotic disease affects the thoracic aorta as well as the RCA, LAD and left circumflex coronary artery. No pericardial effusion noted. There is no mediastinal or hilar adenopathy. No axillary or supraclavicular adenopathy identified.  Review of the visualized bony structures is significant for mild multilevel thoracic spondylosis.  CT ABDOMEN AND PELVIS FINDINGS  There are several small, indeterminate low attenuation structures  within the liver  parenchyma. These are too small to characterize. Index hypodensity within the right hepatic lobe measures 6 mm, image number 58/series 2. Near the dome of liver there is a 5 mm hypodensity, image 51/series 2. Posterior right hepatic lobe hypodensity measures 5 mm, image 62/series 2. The gallbladder appears normal. There is no biliary dilatation. Normal appearance of the pancreas. The spleen is unremarkable.  Normal appearance of the adrenal glands. Left renal cyst identified. Bilateral renal hypodensities are noted likely representing cysts. 3.8 cm diverticula arises from the right posterior bladder wall. Calcifications within the prostate gland are identified.  Calcified atherosclerotic disease affects the abdominal aorta. There is no aneurysm. No retroperitoneal adenopathy identified. No pelvic or inguinal adenopathy noted.  Normal appearance of the stomach. The small bowel loops are within normal limits circumferential mass involving the mid transverse colon is identified, image number 82/series 2. This measures approximately 6 cm in length. There is increased caliber of the proximal large bowel suggesting partial obstruction. No ascites or peritoneal nodularity identified.  Review of the visualized osseous structures is significant for mild lumbar spondylosis. No aggressive lytic or sclerotic bone lesions identified.  IMPRESSION: CT chest:  1. Circumferential mass involving the mid transverse colon is identified compatible with colon carcinoma. 2. Subpleural nodule in the right upper lobe measures 5 mm. 3. Several low-attenuation foci are identified within the liver parenchyma. These are too small to reliably characterize. In a patient with a new diagnosis of colon cancer I would suggest more definitive characterization with contrast enhanced MRI of the liver.   Electronically Signed   By: Kerby Moors M.D.   On: 02/08/2013 14:46   Ct Abdomen Pelvis W Contrast  02/08/2013   CLINICAL DATA:  Mass in  transverse colon  EXAM: CT CHEST, ABDOMEN, AND PELVIS WITH CONTRAST  TECHNIQUE: Multidetector CT imaging of the chest, abdomen and pelvis was performed following the standard protocol during bolus administration of intravenous contrast.  CONTRAST:  170mL OMNIPAQUE IOHEXOL 300 MG/ML  SOLN  COMPARISON:  100 cc of Omni 300  FINDINGS: CT CHEST FINDINGS  There is no pleural effusion identified. Subpleural nodule within the anterior right upper lobe measures 5 mm, image 33/series 3. Subpleural scar is identified in the right upper lobe, image 28/series 3.  The trachea appears patent and midline. The heart size is normal. Calcified atherosclerotic disease affects the thoracic aorta as well as the RCA, LAD and left circumflex coronary artery. No pericardial effusion noted. There is no mediastinal or hilar adenopathy. No axillary or supraclavicular adenopathy identified.  Review of the visualized bony structures is significant for mild multilevel thoracic spondylosis.  CT ABDOMEN AND PELVIS FINDINGS  There are several small, indeterminate low attenuation structures within the liver parenchyma. These are too small to characterize. Index hypodensity within the right hepatic lobe measures 6 mm, image number 58/series 2. Near the dome of liver there is a 5 mm hypodensity, image 51/series 2. Posterior right hepatic lobe hypodensity measures 5 mm, image 62/series 2. The gallbladder appears normal. There is no biliary dilatation. Normal appearance of the pancreas. The spleen is unremarkable.  Normal appearance of the adrenal glands. Left renal cyst identified. Bilateral renal hypodensities are noted likely representing cysts. 3.8 cm diverticula arises from the right posterior bladder wall. Calcifications within the prostate gland are identified.  Calcified atherosclerotic disease affects the abdominal aorta. There is no aneurysm. No retroperitoneal adenopathy identified. No pelvic or inguinal adenopathy noted.  Normal appearance of  the stomach.  The small bowel loops are within normal limits circumferential mass involving the mid transverse colon is identified, image number 82/series 2. This measures approximately 6 cm in length. There is increased caliber of the proximal large bowel suggesting partial obstruction. No ascites or peritoneal nodularity identified.  Review of the visualized osseous structures is significant for mild lumbar spondylosis. No aggressive lytic or sclerotic bone lesions identified.  IMPRESSION: CT chest:  1. Circumferential mass involving the mid transverse colon is identified compatible with colon carcinoma. 2. Subpleural nodule in the right upper lobe measures 5 mm. 3. Several low-attenuation foci are identified within the liver parenchyma. These are too small to reliably characterize. In a patient with a new diagnosis of colon cancer I would suggest more definitive characterization with contrast enhanced MRI of the liver.   Electronically Signed   By: Kerby Moors M.D.   On: 02/08/2013 14:46   US Renal  02/02/2013   CLINICAL DATA:  Renal failure  EXAM: RENAL/URINARY TRACT ULTRASOUND COMPLETE  COMPARISON:  None.  FINDINGS: Right Kidney:  Length: 11.6 cm. Echogenic renal parenchyma, suggesting medical renal disease. 1.4 x 1.1 x 1.2 cm upper pole cyst. No hydronephrosis.  Left Kidney:  Length: 11.7 cm. Echogenic renal parenchyma, suggesting medical renal disease. 2.1 x 2.0 x 2.0 cm upper pole cyst. No hydronephrosis.  Bladder:  Decompressed by indwelling Foley catheter.  IMPRESSION: Echogenic renal parenchyma, suggesting medical renal disease.  Bilateral renal cysts.  No hydronephrosis.   Electronically Signed   By: Julian Hy M.D.   On: 02/02/2013 20:59   Mr Liver W Contrast  02/14/2013   CLINICAL DATA:  Colonic mass, presumably colon cancer, at prior imaging, liver masses on prior CT  EXAM: MRI ABDOMEN WITHOUT CONTRAST  TECHNIQUE: Multiplanar multisequence MR imaging of the abdomen was performed after  the administration of intravenous contrast.  CONTRAST:  10 cc Eovist IV contrast  COMPARISON:  CT 02/08/2013  FINDINGS: Small bilateral pleural effusions are identified. Evidence of midline abdominal incision and diverting right lower quadrant colostomy. Motion artifact is present on multiple series. This essentially obscures detail of the previously described sub cm hepatic lesions, although no enhancement is identified at the largest of these masses measuring 6 mm within the right hepatic lobe, for example image 35 series 503 allowing for motion. On T2 weighted images, the previously seen sub cm hypodense lesions in the right hepatic lobe appear T2 hyperintense. The dome of medial segment left hepatic lobe 3 mm lesion appears T2 hyperintense but is too small for definitive characterization on postcontrast imaging allowing for motion.  Adrenal glands, spleen, pancreas, and gallbladder are normal. No ascites. Bilateral renal cortical cysts are noted.  IMPRESSION: Hepatic T2 hyperintense lesions are identified most compatible with biliary cysts or hamartomas. Because of patient motion, only the largest of these measuring 6 mm in the posterior segment right hepatic lobe can be relatively confidently identified as lacking internal contrast enhancement. The others are too small for postcontrast evaluation due to patient motion. Further workup is as per the overall patient's periodic restaging plan.  Bilateral small pleural effusions.  Interval diverting right lower quadrant colostomy.   Electronically Signed   By: Conchita Paris M.D.   On: 02/14/2013 09:09   Dg Chest Portable 1 View  02/02/2013   CLINICAL DATA:  Weakness, presyncope  EXAM: PORTABLE CHEST - 1 VIEW  COMPARISON:  06/16/2011  FINDINGS: Mildly low lung volumes. There is mild elevation the right hemidiaphragm. Cardiac silhouette is within normal limits.  There is no evidence of focal infiltrates, effusions, nor edema. Dextroscoliosis identified within the  thoracic spine.  IMPRESSION: No evidence of acute cardiopulmonary disease.   Electronically Signed   By: Margaree Mackintosh M.D.   On: 02/02/2013 17:34    CBC  Recent Labs Lab 02/18/13 1721 02/19/13 0820 02/20/13 0400 02/21/13 0455 02/22/13 0529  WBC 8.5 6.9 5.5 4.4 4.4  HGB 9.4* 9.0* 8.6* 8.6* 9.5*  HCT 30.4* 29.3* 28.4* 28.2* 31.0*  PLT 194 199 210 224 252  MCV 91.0 90.4 91.6 91.9 90.9  MCH 28.1 27.8 27.7 28.0 27.9  MCHC 30.9 30.7 30.3 30.5 30.6  RDW 17.1* 17.6* 17.7* 17.6* 17.3*    Chemistries   Recent Labs Lab 02/19/13 0820 02/20/13 0400 02/21/13 0455 02/22/13 0529 02/23/13 0750  NA 143 145 145 142 142  K 4.0 3.9 4.1 3.6* 3.5*  CL 105 107 106 104 105  CO2 25 25 24 22 22   GLUCOSE 90 87 83 96 89  BUN 10 10 10 7  5*  CREATININE 1.40* 1.32 1.22 1.01 0.98  CALCIUM 8.5 8.4 8.5 8.7 8.5   ------------------------------------------------------------------------------------------------------------------ estimated creatinine clearance is 111.8 ml/min (by C-G formula based on Cr of 0.98). ------------------------------------------------------------------------------------------------------------------ No results found for this basename: HGBA1C,  in the last 72 hours ------------------------------------------------------------------------------------------------------------------ No results found for this basename: CHOL, HDL, LDLCALC, TRIG, CHOLHDL, LDLDIRECT,  in the last 72 hours ------------------------------------------------------------------------------------------------------------------ No results found for this basename: TSH, T4TOTAL, FREET3, T3FREE, THYROIDAB,  in the last 72 hours ------------------------------------------------------------------------------------------------------------------ No results found for this basename: VITAMINB12, FOLATE, FERRITIN, TIBC, IRON, RETICCTPCT,  in the last 72 hours  Coagulation profile No results found for this basename: INR,  PROTIME,  in the last 168 hours  No results found for this basename: DDIMER,  in the last 72 hours  Cardiac Enzymes No results found for this basename: CK, CKMB, TROPONINI, MYOGLOBIN,  in the last 168 hours ------------------------------------------------------------------------------------------------------------------ No components found with this basename: POCBNP,

## 2013-02-23 NOTE — Progress Notes (Signed)
Pt transferred to 6E17 from [place]. Telemetry TX17 placed with notification to central telemetry. VSS. Chart reviewed. Continue to monitor. Manya Silvas, RN

## 2013-02-24 ENCOUNTER — Other Ambulatory Visit: Payer: Self-pay

## 2013-02-24 DIAGNOSIS — J449 Chronic obstructive pulmonary disease, unspecified: Secondary | ICD-10-CM

## 2013-02-24 DIAGNOSIS — R609 Edema, unspecified: Secondary | ICD-10-CM

## 2013-02-24 DIAGNOSIS — I4891 Unspecified atrial fibrillation: Secondary | ICD-10-CM

## 2013-02-24 DIAGNOSIS — R0602 Shortness of breath: Secondary | ICD-10-CM

## 2013-02-24 DIAGNOSIS — I4729 Other ventricular tachycardia: Secondary | ICD-10-CM

## 2013-02-24 DIAGNOSIS — I472 Ventricular tachycardia: Secondary | ICD-10-CM

## 2013-02-24 DIAGNOSIS — I517 Cardiomegaly: Secondary | ICD-10-CM

## 2013-02-24 DIAGNOSIS — K922 Gastrointestinal hemorrhage, unspecified: Secondary | ICD-10-CM

## 2013-02-24 LAB — CBC
HCT: 31.7 % — ABNORMAL LOW (ref 39.0–52.0)
Hemoglobin: 9.7 g/dL — ABNORMAL LOW (ref 13.0–17.0)
MCH: 27.5 pg (ref 26.0–34.0)
MCHC: 30.6 g/dL (ref 30.0–36.0)
MCV: 89.8 fL (ref 78.0–100.0)
Platelets: 286 10*3/uL (ref 150–400)
RBC: 3.53 MIL/uL — ABNORMAL LOW (ref 4.22–5.81)
RDW: 17.1 % — AB (ref 11.5–15.5)
WBC: 6.8 10*3/uL (ref 4.0–10.5)

## 2013-02-24 LAB — BASIC METABOLIC PANEL
BUN: 3 mg/dL — AB (ref 6–23)
BUN: 3 mg/dL — AB (ref 6–23)
CALCIUM: 8.7 mg/dL (ref 8.4–10.5)
CHLORIDE: 107 meq/L (ref 96–112)
CO2: 21 mEq/L (ref 19–32)
CO2: 24 mEq/L (ref 19–32)
Calcium: 8.7 mg/dL (ref 8.4–10.5)
Chloride: 106 mEq/L (ref 96–112)
Creatinine, Ser: 1 mg/dL (ref 0.50–1.35)
Creatinine, Ser: 0.99 mg/dL (ref 0.50–1.35)
GFR calc Af Amer: >90 mL/min (ref >90)
GFR calc non Af Amer: 87 mL/min — ABNORMAL LOW (ref >90)
GFR, EST NON AFRICAN AMERICAN: 80 mL/min — AB (ref >90)
GLUCOSE: 91 mg/dL (ref 70–99)
Glucose, Bld: 126 mg/dL — ABNORMAL HIGH (ref 70–99)
POTASSIUM: 3.4 meq/L — AB (ref 3.7–5.3)
POTASSIUM: 4.2 meq/L (ref 3.7–5.3)
SODIUM: 142 meq/L (ref 137–147)
Sodium: 142 mEq/L (ref 137–147)

## 2013-02-24 LAB — MRSA PCR SCREENING: MRSA BY PCR: NEGATIVE

## 2013-02-24 LAB — MAGNESIUM
MAGNESIUM: 1.7 mg/dL (ref 1.5–2.5)
MAGNESIUM: 2.3 mg/dL (ref 1.5–2.5)

## 2013-02-24 LAB — D-DIMER, QUANTITATIVE: D-Dimer, Quant: 7.13 ug/mL-FEU — ABNORMAL HIGH (ref 0.00–0.48)

## 2013-02-24 LAB — TROPONIN I
Troponin I: <0.3 ng/mL (ref <0.30)
Troponin I: <0.3 ng/mL (ref <0.30)

## 2013-02-24 MED ORDER — MAGNESIUM SULFATE 50 % IJ SOLN: 1.0000 g | Freq: Once | INTRAMUSCULAR | Status: DC

## 2013-02-24 MED ORDER — MAGNESIUM SULFATE IN D5W 10-5 MG/ML-% IV SOLN
1.0000 g | Freq: Once | INTRAVENOUS | Status: DC
  Filled 2013-02-24: qty 100

## 2013-02-24 MED ORDER — POTASSIUM CHLORIDE 10 MEQ/100ML IV SOLN
10.0000 meq | INTRAVENOUS | Status: AC
  Administered 2013-02-24 (×4): 10 meq via INTRAVENOUS
  Filled 2013-02-24 (×4): qty 100

## 2013-02-24 MED ORDER — MAGNESIUM SULFATE 50 % IJ SOLN
2.0000 g | Freq: Once | INTRAMUSCULAR | Status: DC
  Filled 2013-02-24: qty 4

## 2013-02-24 MED ORDER — MAGNESIUM SULFATE IN D5W 10-5 MG/ML-% IV SOLN
1.0000 g | Freq: Once | INTRAVENOUS | Status: AC
  Administered 2013-02-24: 1 g via INTRAVENOUS

## 2013-02-24 MED ORDER — FERROUS SULFATE 325 (65 FE) MG PO TABS
325.0000 mg | ORAL_TABLET | Freq: Every day | ORAL | Status: DC
  Administered 2013-02-25 – 2013-03-01 (×5): 325 mg via ORAL
  Filled 2013-02-24 (×6): qty 1

## 2013-02-24 MED ORDER — POTASSIUM CHLORIDE CRYS ER 20 MEQ PO TBCR
40.0000 meq | EXTENDED_RELEASE_TABLET | Freq: Once | ORAL | Status: AC
  Administered 2013-02-24: 40 meq via ORAL
  Filled 2013-02-24: qty 2

## 2013-02-24 MED ORDER — MAGNESIUM SULFATE 40 MG/ML IJ SOLN
2.0000 g | Freq: Once | INTRAMUSCULAR | Status: AC
  Administered 2013-02-24: 2 g via INTRAVENOUS
  Filled 2013-02-24: qty 50

## 2013-02-24 MED ORDER — ASPIRIN 81 MG PO CHEW
81.0000 mg | CHEWABLE_TABLET | Freq: Every day | ORAL | Status: DC
  Administered 2013-02-24 – 2013-03-01 (×6): 81 mg via ORAL
  Filled 2013-02-24 (×6): qty 1

## 2013-02-24 MED ORDER — LIDOCAINE BOLUS VIA INFUSION
100.0000 mg | Freq: Once | INTRAVENOUS | Status: AC
  Administered 2013-02-24: 100 mg via INTRAVENOUS
  Filled 2013-02-24: qty 100

## 2013-02-24 MED ORDER — MAGNESIUM SULFATE 50 % IJ SOLN
1.0000 g | INTRAMUSCULAR | Status: DC
  Filled 2013-02-24: qty 2

## 2013-02-24 MED ORDER — LIDOCAINE IN D5W 4-5 MG/ML-% IV SOLN
1.0000 mg/min | INTRAVENOUS | Status: DC
  Administered 2013-02-24: 1 mg/min via INTRAVENOUS
  Filled 2013-02-24 (×2): qty 250

## 2013-02-24 NOTE — Progress Notes (Signed)
  Echocardiogram 2D Echocardiogram has been performed.  Larry [name], Larry [name] 02/24/2013, 12:21 PM

## 2013-02-24 NOTE — Progress Notes (Signed)
[facility] progress note. Chief complaint. CODE BLUE note. History of present illness. This 63 year old male in hospital with a colonic mass status post colectomy. Patient has a known history of CHF with EF 45-50% her 2013 cardiac catheterization. During initial part of hospitalization patient in atrial fib/SVT with cardiology following and transitioning from diltiazem drip to oral diltiazem CD 240 mg daily. Patient was in his usual state of health when patient noted that V. fib per telemetry. Nursing at came to the patient's room and found him unresponsive. CPR was initiated. At some point a shockable rhythm was noted and the patient was defibrillated. Post defibrillation patient became alert and stable her vital signs. Patient was transferred to cardiac ICU. An EKG was obtained post resuscitation noting flipped T waves in V. leads and some ST depression. Patient has no complaints and in particular denies chest pain. Vital signs. Temperature 98.4, pulse 105, respiration 18, blood pressure 118/63. O2 sats on Ventimask 100%. General appearance. Obese elderly male who is alert, cooperative, and in no distress. Cardiac. Regular with occasional irregular beats. Mildly tachycardic with apical rate about 105. Lungs. Somewhat reduced in the bases otherwise clear without distress Abdomen. Soft with positive bowel sounds. Incision appears stable. Impression/plan Problem #1 V. fib arrest. Patient successfully defibrillated by CODE BLUE team. Patient does have an EKG changes as described. Labs have been obtained including troponin, basic metabolic panel, serum magnesium, CBC. I contacted Doctor Turner on-call for cardiology who has kindly consented to come see the patient this a.m. We'll follow for any further cardiology recommendations.

## 2013-02-24 NOTE — Progress Notes (Addendum)
Patient ID: TAVEON ENYEART, male   DOB: 18-Dec-1950, 63 y.o.   MRN: [id number] 6 Days Post-Op  Subjective: Events of last night noted.  Patient looks great this morning.  No nausea, feels actually much better.  Objective: Vital signs in last 24 hours: Temp:  [97.6 F (36.4 C)-98.4 F (36.9 C)] 98.1 F (36.7 C) (01/04 0800) Pulse Rate:  [43-124] 81 (01/04 0800) Resp:  [11-21] 15 (01/04 0800) BP: (98-172)/(57-106) 134/74 mmHg (01/04 0800) SpO2:  [95 %-100 %] 99 % (01/04 0800) Weight:  [298 lb 1 oz (135.2 kg)] 298 lb 1 oz (135.2 kg) (01/04 0435) Last BM Date: 02/23/13  Intake/Output from previous day: 01/03 0701 - 01/04 0700 In: 2380 [P.O.:480; I.V.:1800; IV Piggyback:100] Out: 4160 [Urine:3925; Drains:35; Stool:200] Intake/Output this shift: Total I/O In: 235 [I.V.:85; IV Piggyback:150] Out: 750 [Urine:750]  PE: Abd: much softer, less distended, +BS, ostomy with good output, midline incision with VAC in place  Lab Results:   Recent Labs  02/22/13 0529  WBC 4.4  HGB 9.5*  HCT 31.0*  PLT 252   BMET  Recent Labs  02/23/13 0750 02/24/13 0415  NA 142 142  K 3.5* 3.4*  CL 105 107  CO2 22 21  GLUCOSE 89 126*  BUN 5* 3*  CREATININE 0.98 1.00  CALCIUM 8.5 8.7   PT/INR No results found for this basename: LABPROT, INR,  in the last 72 hours CMP     Component Value Date/Time   NA 142 02/24/2013 0415   K 3.4* 02/24/2013 0415   CL 107 02/24/2013 0415   CO2 21 02/24/2013 0415   GLUCOSE 126* 02/24/2013 0415   BUN 3* 02/24/2013 0415   CREATININE 1.00 02/24/2013 0415   CALCIUM 8.7 02/24/2013 0415   PROT 6.0 02/14/2013 0040   ALBUMIN 2.5* 02/14/2013 0040   AST 7 02/14/2013 0040   ALT 6 02/14/2013 0040   ALKPHOS 72 02/14/2013 0040   BILITOT 2.1* 02/14/2013 0040   GFRNONAA 80* 02/24/2013 0415   GFRAA >90 02/24/2013 0415   Lipase     Component Value Date/Time   LIPASE 15 11/05/2008 1215       Studies/Results: Dg Abd Portable 1v  02/23/2013   CLINICAL DATA:  Lower  abdominal pain  EXAM: PORTABLE ABDOMEN - 1 VIEW  COMPARISON:  MR ABDOMEN W/ CM dated 02/13/2013; CT CHEST W/CM dated 02/08/2013  FINDINGS: Small amount residual oral contrast within the colon from CT of 02/09/2003. There are mildly dilated loops of small bowel in the left lower quadrant measuring up to 4 cm. . No evidence of intraperitoneal free air on this supine exam.  IMPRESSION: 1. Dilated loops of small bowel in the left lower quadrant consistent with focal ileus or partial obstruction.  2. No evidence of high-grade obstruction. There is contrast within the rectum from CT of 02/08/2013.   Electronically Signed   By: Suzy Bouchard M.D.   On: 02/23/2013 12:38    Anti-infectives: Anti-infectives   Start     Dose/Rate Route Frequency Ordered Stop   02/18/13 1057  ANCEF 1 gram in 0.9% normal saline 500 mL  Status:  Discontinued       As needed 02/18/13 1057 02/18/13 1152   02/12/13 2330  cefoTEtan (CEFOTAN) 1 g in dextrose 5 % 50 mL IVPB     1 g 100 mL/hr over 30 Minutes Intravenous  Once 02/12/13 1717 02/13/13 0024   02/12/13 0600  [MAR Hold]  cefoTEtan (CEFOTAN) 2 g in dextrose 5 %  50 mL IVPB     (On MAR Hold since 02/12/13 1206)   2 g 100 mL/hr over 30 Minutes Intravenous On call to O.R. 02/11/13 1706 02/12/13 1216       Assessment/Plan  1. POD 12/6 from Hartman's procedure with later washout and closure for dehiscence  2. Post op ileus, improving  3. Stage 2 colonic adenocarcinoma 4. V fib arrest  Plan: 1. Patient is much better today.  Will try clear liquids and follow closely.  He should get his VAC changed tomorrow. 2. Ok to heparinize or take to cath lab if needed from a surgical standpoint.  LOS: 22 days    Armandina Iman E 02/24/2013, 8:42 AM Pager: [phone]

## 2013-02-24 NOTE — Progress Notes (Signed)
Seen, examined, agree with above.  Coded last night.  Looks like torsades.    Cards working on increasing rate, giving magnesium, K and lidocaine.  Doing well surgically.

## 2013-02-24 NOTE — Progress Notes (Signed)
Pt went into Vfib around 0630 shock given x1. Returned to NSR after shock given. Pt is A&O. MD aware orders received. Will continue to monitor.

## 2013-02-24 NOTE — Progress Notes (Signed)
Bilateral lower extremity venous duplex:  No evidence of DVT, superficial thrombosis, or Baker's Cyst.   

## 2013-02-24 NOTE — Progress Notes (Addendum)
Patient ID: Larry [name], male   DOB: 1950-09-29, 63 y.o.   MRN: [id number]   SUBJECTIVE: Patient had cardiac arrest last night: torsades de pointes was rhythm.  He was cardioverted, going into atrial fibrillation.  QT was profoundly prolonged out to 700 msec.  He then went into torsades again around 6:30 am and was again cardioverted, this time going into NSR.  Currently he has some very slight soreness at the defibrillation site but otherwise not chest pain.  Actually feels fine right now.   . enoxaparin (LOVENOX) injection  60 mg Subcutaneous Q24H  . magnesium sulfate  2 g Intravenous Once  . pantoprazole  40 mg Oral QHS  . potassium chloride  10 mEq Intravenous Q1 Hr x 4  . potassium chloride  40 mEq Oral Daily  lidocaine gtt @1  mg/min    Filed Vitals:   02/24/13 0630 02/24/13 0634 02/24/13 0645 02/24/13 0700  BP: 110/79 172/106 120/90 118/78  Pulse: 43 104 84 77  Temp:      TempSrc:      Resp: 11 21 16 13   Height:      Weight:      SpO2: 100% 100% 99% 100%    Intake/Output Summary (Last 24 hours) at 02/24/13 0748 Last data filed at 02/24/13 0700  Gross per 24 hour  Intake   2380 ml  Output   4150 ml  Net  -1770 ml    LABS: Basic Metabolic Panel:  Recent Labs  02/23/13 0750 02/24/13 0415  NA 142 142  K 3.5* 3.4*  CL 105 107  CO2 22 21  GLUCOSE 89 126*  BUN 5* 3*  CREATININE 0.98 1.00  CALCIUM 8.5 8.7  MG  --  1.7   Liver Function Tests: No results found for this basename: AST, ALT, ALKPHOS, BILITOT, PROT, ALBUMIN,  in the last 72 hours No results found for this basename: LIPASE, AMYLASE,  in the last 72 hours CBC:  Recent Labs  02/22/13 0529  WBC 4.4  HGB 9.5*  HCT 31.0*  MCV 90.9  PLT 252   Cardiac Enzymes:  Recent Labs  02/24/13 0415  TROPONINI <0.30   BNP: No components found with this basename: POCBNP,  D-Dimer:  Recent Labs  02/24/13 0555  DDIMER 7.13*   Hemoglobin A1C: No results found for this basename: HGBA1C,  in the  last 72 hours Fasting Lipid Panel: No results found for this basename: CHOL, HDL, LDLCALC, TRIG, CHOLHDL, LDLDIRECT,  in the last 72 hours Thyroid Function Tests: No results found for this basename: TSH, T4TOTAL, FREET3, T3FREE, THYROIDAB,  in the last 72 hours Anemia Panel: No results found for this basename: VITAMINB12, FOLATE, FERRITIN, TIBC, IRON, RETICCTPCT,  in the last 72 hours  RADIOLOGY: Ct Chest W Contrast  02/08/2013   CLINICAL DATA:  Mass in transverse colon  EXAM: CT CHEST, ABDOMEN, AND PELVIS WITH CONTRAST  TECHNIQUE: Multidetector CT imaging of the chest, abdomen and pelvis was performed following the standard protocol during bolus administration of intravenous contrast.  CONTRAST:  158mL OMNIPAQUE IOHEXOL 300 MG/ML  SOLN  COMPARISON:  100 cc of Omni 300  FINDINGS: CT CHEST FINDINGS  There is no pleural effusion identified. Subpleural nodule within the anterior right upper lobe measures 5 mm, image 33/series 3. Subpleural scar is identified in the right upper lobe, image 28/series 3.  The trachea appears patent and midline. The heart size is normal. Calcified atherosclerotic disease affects the thoracic aorta as well as the RCA, LAD  and left circumflex coronary artery. No pericardial effusion noted. There is no mediastinal or hilar adenopathy. No axillary or supraclavicular adenopathy identified.  Review of the visualized bony structures is significant for mild multilevel thoracic spondylosis.  CT ABDOMEN AND PELVIS FINDINGS  There are several small, indeterminate low attenuation structures within the liver parenchyma. These are too small to characterize. Index hypodensity within the right hepatic lobe measures 6 mm, image number 58/series 2. Near the dome of liver there is a 5 mm hypodensity, image 51/series 2. Posterior right hepatic lobe hypodensity measures 5 mm, image 62/series 2. The gallbladder appears normal. There is no biliary dilatation. Normal appearance of the pancreas. The  spleen is unremarkable.  Normal appearance of the adrenal glands. Left renal cyst identified. Bilateral renal hypodensities are noted likely representing cysts. 3.8 cm diverticula arises from the right posterior bladder wall. Calcifications within the prostate gland are identified.  Calcified atherosclerotic disease affects the abdominal aorta. There is no aneurysm. No retroperitoneal adenopathy identified. No pelvic or inguinal adenopathy noted.  Normal appearance of the stomach. The small bowel loops are within normal limits circumferential mass involving the mid transverse colon is identified, image number 82/series 2. This measures approximately 6 cm in length. There is increased caliber of the proximal large bowel suggesting partial obstruction. No ascites or peritoneal nodularity identified.  Review of the visualized osseous structures is significant for mild lumbar spondylosis. No aggressive lytic or sclerotic bone lesions identified.  IMPRESSION: CT chest:  1. Circumferential mass involving the mid transverse colon is identified compatible with colon carcinoma. 2. Subpleural nodule in the right upper lobe measures 5 mm. 3. Several low-attenuation foci are identified within the liver parenchyma. These are too small to reliably characterize. In a patient with a new diagnosis of colon cancer I would suggest more definitive characterization with contrast enhanced MRI of the liver.   Electronically Signed   By: Kerby Moors M.D.   On: 02/08/2013 14:46   Ct Abdomen Pelvis W Contrast  02/08/2013   CLINICAL DATA:  Mass in transverse colon  EXAM: CT CHEST, ABDOMEN, AND PELVIS WITH CONTRAST  TECHNIQUE: Multidetector CT imaging of the chest, abdomen and pelvis was performed following the standard protocol during bolus administration of intravenous contrast.  CONTRAST:  155mL OMNIPAQUE IOHEXOL 300 MG/ML  SOLN  COMPARISON:  100 cc of Omni 300  FINDINGS: CT CHEST FINDINGS  There is no pleural effusion identified.  Subpleural nodule within the anterior right upper lobe measures 5 mm, image 33/series 3. Subpleural scar is identified in the right upper lobe, image 28/series 3.  The trachea appears patent and midline. The heart size is normal. Calcified atherosclerotic disease affects the thoracic aorta as well as the RCA, LAD and left circumflex coronary artery. No pericardial effusion noted. There is no mediastinal or hilar adenopathy. No axillary or supraclavicular adenopathy identified.  Review of the visualized bony structures is significant for mild multilevel thoracic spondylosis.  CT ABDOMEN AND PELVIS FINDINGS  There are several small, indeterminate low attenuation structures within the liver parenchyma. These are too small to characterize. Index hypodensity within the right hepatic lobe measures 6 mm, image number 58/series 2. Near the dome of liver there is a 5 mm hypodensity, image 51/series 2. Posterior right hepatic lobe hypodensity measures 5 mm, image 62/series 2. The gallbladder appears normal. There is no biliary dilatation. Normal appearance of the pancreas. The spleen is unremarkable.  Normal appearance of the adrenal glands. Left renal cyst identified. Bilateral renal hypodensities  are noted likely representing cysts. 3.8 cm diverticula arises from the right posterior bladder wall. Calcifications within the prostate gland are identified.  Calcified atherosclerotic disease affects the abdominal aorta. There is no aneurysm. No retroperitoneal adenopathy identified. No pelvic or inguinal adenopathy noted.  Normal appearance of the stomach. The small bowel loops are within normal limits circumferential mass involving the mid transverse colon is identified, image number 82/series 2. This measures approximately 6 cm in length. There is increased caliber of the proximal large bowel suggesting partial obstruction. No ascites or peritoneal nodularity identified.  Review of the visualized osseous structures is  significant for mild lumbar spondylosis. No aggressive lytic or sclerotic bone lesions identified.  IMPRESSION: CT chest:  1. Circumferential mass involving the mid transverse colon is identified compatible with colon carcinoma. 2. Subpleural nodule in the right upper lobe measures 5 mm. 3. Several low-attenuation foci are identified within the liver parenchyma. These are too small to reliably characterize. In a patient with a new diagnosis of colon cancer I would suggest more definitive characterization with contrast enhanced MRI of the liver.   Electronically Signed   By: Kerby Moors M.D.   On: 02/08/2013 14:46   US Renal  02/02/2013   CLINICAL DATA:  Renal failure  EXAM: RENAL/URINARY TRACT ULTRASOUND COMPLETE  COMPARISON:  None.  FINDINGS: Right Kidney:  Length: 11.6 cm. Echogenic renal parenchyma, suggesting medical renal disease. 1.4 x 1.1 x 1.2 cm upper pole cyst. No hydronephrosis.  Left Kidney:  Length: 11.7 cm. Echogenic renal parenchyma, suggesting medical renal disease. 2.1 x 2.0 x 2.0 cm upper pole cyst. No hydronephrosis.  Bladder:  Decompressed by indwelling Foley catheter.  IMPRESSION: Echogenic renal parenchyma, suggesting medical renal disease.  Bilateral renal cysts.  No hydronephrosis.   Electronically Signed   By: Julian Hy M.D.   On: 02/02/2013 20:59   Mr Liver W Contrast  02/14/2013   CLINICAL DATA:  Colonic mass, presumably colon cancer, at prior imaging, liver masses on prior CT  EXAM: MRI ABDOMEN WITHOUT CONTRAST  TECHNIQUE: Multiplanar multisequence MR imaging of the abdomen was performed after the administration of intravenous contrast.  CONTRAST:  10 cc Eovist IV contrast  COMPARISON:  CT 02/08/2013  FINDINGS: Small bilateral pleural effusions are identified. Evidence of midline abdominal incision and diverting right lower quadrant colostomy. Motion artifact is present on multiple series. This essentially obscures detail of the previously described sub cm hepatic  lesions, although no enhancement is identified at the largest of these masses measuring 6 mm within the right hepatic lobe, for example image 35 series 503 allowing for motion. On T2 weighted images, the previously seen sub cm hypodense lesions in the right hepatic lobe appear T2 hyperintense. The dome of medial segment left hepatic lobe 3 mm lesion appears T2 hyperintense but is too small for definitive characterization on postcontrast imaging allowing for motion.  Adrenal glands, spleen, pancreas, and gallbladder are normal. No ascites. Bilateral renal cortical cysts are noted.  IMPRESSION: Hepatic T2 hyperintense lesions are identified most compatible with biliary cysts or hamartomas. Because of patient motion, only the largest of these measuring 6 mm in the posterior segment right hepatic lobe can be relatively confidently identified as lacking internal contrast enhancement. The others are too small for postcontrast evaluation due to patient motion. Further workup is as per the overall patient's periodic restaging plan.  Bilateral small pleural effusions.  Interval diverting right lower quadrant colostomy.   Electronically Signed   By: Roena Malady.D.  On: 02/14/2013 09:09   Dg Chest Port 1 View  02/19/2013   CLINICAL DATA:  Cough, congestion  EXAM: PORTABLE CHEST - 1 VIEW  COMPARISON:  02/18/2013  FINDINGS: The cardiac silhouette is enlarged. Low lung volumes. The lungs are clear. An NG tube is seen tip projecting in the expected region of the stomach, fundal region. The osseous structures are unremarkable.  IMPRESSION: Low lung volumes, cardiomegaly, no evidence of acute cardiopulmonary disease.   Electronically Signed   By: Margaree Mackintosh M.D.   On: 02/19/2013 08:39   Dg Chest Port 1 View  02/18/2013   CLINICAL DATA:  Postop congestion.  EXAM: PORTABLE CHEST - 1 VIEW  COMPARISON:  02/08/2013 CT chest.  02/03/2012 chest x-ray.  FINDINGS: Cardiomegaly.  Pulmonary vascular congestion most notable  centrally.  Question basilar subsegmental atelectasis.  CT detected subpleural nodule right upper lobe not evaluated on present exam.  IMPRESSION: Cardiomegaly.  Pulmonary vascular congestion most notable centrally.  Question basilar subsegmental atelectasis.  CT detected subpleural nodule right upper lobe not evaluated on present exam.   Electronically Signed   By: Chauncey Cruel M.D.   On: 02/18/2013 13:13   Dg Chest Portable 1 View  02/02/2013   CLINICAL DATA:  Weakness, presyncope  EXAM: PORTABLE CHEST - 1 VIEW  COMPARISON:  06/16/2011  FINDINGS: Mildly low lung volumes. There is mild elevation the right hemidiaphragm. Cardiac silhouette is within normal limits. There is no evidence of focal infiltrates, effusions, nor edema. Dextroscoliosis identified within the thoracic spine.  IMPRESSION: No evidence of acute cardiopulmonary disease.   Electronically Signed   By: Margaree Mackintosh M.D.   On: 02/02/2013 17:34   Dg Abd Portable 1v  02/23/2013   CLINICAL DATA:  Lower abdominal pain  EXAM: PORTABLE ABDOMEN - 1 VIEW  COMPARISON:  MR ABDOMEN W/ CM dated 02/13/2013; CT CHEST W/CM dated 02/08/2013  FINDINGS: Small amount residual oral contrast within the colon from CT of 02/09/2003. There are mildly dilated loops of small bowel in the left lower quadrant measuring up to 4 cm. . No evidence of intraperitoneal free air on this supine exam.  IMPRESSION: 1. Dilated loops of small bowel in the left lower quadrant consistent with focal ileus or partial obstruction.  2. No evidence of high-grade obstruction. There is contrast within the rectum from CT of 02/08/2013.   Electronically Signed   By: Suzy Bouchard M.D.   On: 02/23/2013 12:38    PHYSICAL EXAM General: NAD Neck: JVP 8 cm, no thyromegaly or thyroid nodule.  Lungs: Clear to auscultation bilaterally with normal respiratory effort. CV: Nondisplaced PMI.  Heart regular S1/S2, no S3/S4, no murmur.  No peripheral edema.  No carotid bruit.  Normal pedal pulses.   Abdomen: Soft, nontender, no hepatosplenomegaly, no distention.  Neurologic: Alert and oriented x 3.  Psych: Normal affect. Extremities: No clubbing or cyanosis.   TELEMETRY: Reviewed telemetry pt in NSR with long QT interval, though seems shorter than earlier this morning  ASSESSMENT AND PLAN: 63 yo with history of CAD, ischemic cardiomyopathy last EF 45-50%, h/o atrial tachycardia and NSVT, and colon cancer is now s/p abdominal surgery for colon cancer excision.  Post-op, he had some atrial tachycardia and had gone back into NSR on diltiazem.  Last night, had 2 episodes of TdP in setting of very long QT interval (> 700 msec).  1. Torsades: In setting of very long QT interval (>700 msec).  He was not bradycardic.  K and Mg mildly low.  No new drugs started that would prolong QT interval.  No chest pain.  I think cardiac ischemia is not particularly likely as he has had no anginal symptoms, TnI intially <0.3, and TdP associated with ischemia often shows up with normal QT interval.  His ECG actually is somewhat concerning for an ECG you would see with either an acute CVA or Takotsubo CMP.  No evidence for CVA.   - Echocardiogram to look at wall motion: could this be a Takotsubo situation?   - Aggressive K and Mg => 2 g MgSO4 additional now and KCl 40 mEq IV.  Follow BMET/Mg frequently.  - lidocaine gtt, start at 1 mg/min. - HR has been elevated, 90s-100s.  Would make sure HR stays on the higher side to decrease risk of further TdP so will hold diltiazem and metoprolol for now.  If has further TdP despite the above, possible temp pacing.  2. H/o CAD: As above, no anginal symptoms.  Add ASA 81 and will cycle cardiac enzymes.  Would likely hold off on LHC unless echo significantly abnormal or cardiac enzymes very elevated. 3. Atrial fibrillation/atrial tachy: He was in atrial fibrillation after initial cardiac arrest with cardioversion, but now in NSR after 2nd cardioversion.   Loralie Champagne 02/24/2013 8:03 AM

## 2013-02-24 NOTE — Progress Notes (Signed)
eLink Physician-Brief Progress Note Patient Name: Larry [name] DOB: 01/30/1951 MRN: [id number]  Date of Service  02/24/2013   HPI/Events of Note   Called by NP post v.fib.  Patient was shocked once and transferred to CCU.  Patient is completely stable, on 2L Baker with stable BP.  It is unclear to me if patient was transferred there since as SDU or ICU status since SDU is full.  He clearly meets SDU criteria.   eICU Interventions  Will monitor from elink at this point, if becomes unstable then PCCM will take over.      YACOUB,WESAM 02/24/2013, 5:06 AM

## 2013-02-24 NOTE — Progress Notes (Signed)
Patient Telemetry began to alarm stating the patient was in Vent Fib then alarmed that he went to [place]. Immediately went to check and the patient and he was found unresponsive, not breathing, cool and clammy. One sternal rub was given, no results. Code Blue was pulled and CPR began around [place]. Oxygen applied. New IV given. Fluid bolus given. Rapid Response (April RN) ordered for a shock to be given. Patient was shocked then came back to being alert and oriented. Now running A. Fib after the EKG was done. Labs drawn and sent. New bed on 2H09 was placed for the patient.  Patient belongings and chart gathered. Transferred to new bed with Rayann Jolley Hill (RN), Levora Angel) and Kathline Magic (MD). Report given at bedside.

## 2013-02-24 NOTE — Consult Note (Signed)
Name: Larry [name] MRN: [id number] DOB: [birth year]    ADMISSION DATE:  02/02/2013 CONSULTATION DATE:  02/25/12  REFERRING [name] :  Internal Medicine PRIMARY SERVICE: PCCM (from [facility])  CHIEF COMPLAINT:  weakness  BRIEF PATIENT DESCRIPTION: 63 yo man, history of HTN, CHF (EF 45-50%), CAD, COPD, NSVT, presenting 12/13 with 4 weeks of generalized weakness, found to have AKI, anemia, and colon adenocarcinoma.  SIGNIFICANT EVENTS / STUDIES:  12/13 - admitted with weakness, Cr = 7.8, hypotensive 12/19 - Colonoscopy showed obstructing mass in transverse colon, EGD showed only some antral erosions.  CT abd showed possible liver mets 12/23 - pt to OR for ex lap partial colectomy w/ colostomy, pathology showed adenocarcinoma with 18 benign lymph nodes. 12/29 - back to OR for wound dehissnace.  Pt developed paroxysmal atrial tach during procedure, started dilt drip 1/4 - pt had cardiac arrest, requiring defibrillation.  QT found to be > 700 msec.  K and Mg replaced, lidocaine drip started  LINES / TUBES: Colostomy 12/23 ETT 12/29>>12/29  CULTURES: MRSA: neg Sputum culture 12/31: normal oropharyngeal flora  ANTIBIOTICS: Cefotetan x1 12/23  HISTORY OF PRESENT ILLNESS:  The patient is a 63 yo man, history of COPD, CHF, NSVT, presenting 12/13 with generalized weakness.  He was found to have an AKI, with Cr up to 7.8 (resolved), anemia requiring transfusions, and an adenocarinoma of the transverse colon.  On 1/4, the patient developed acute onset of cardiac arrest, and QT was found to be > 700.  K and Mg were only mildly below normal, but these were repleted, and the patient was started on a lidocaine drip.  Today, the patient notes no chest pain or palpitations.  He notes some mild shortness of breath that has been present for the last several days, which improves with nebulizer treatments, but no cough.  He still notes some chest soreness from CPR yesterday.  PAST MEDICAL HISTORY :  Past  Medical History  Diagnosis Date  . Hypertension   . Hypercholesteremia   . CHF (congestive heart failure)     has diastolic heart failure grade 1; EF is 45 to 50% per echo 05/2011  . Coronary artery disease   . COPD (chronic obstructive pulmonary disease)   . Asthma   . Obesity   . Noncompliance   . NSVT (nonsustained ventricular tachycardia)     beta blocker restarted  . Atrial tachycardia     managed on beta blocker therapy   Past Surgical History  Procedure Laterality Date  . Coronary stent placement      x2  . Cardiac catheterization  11/12/2008    stent x 2 to RCA  . Colonoscopy N/A 02/08/2013    Procedure: COLONOSCOPY;  Surgeon: Larry [name];  Location: [place];  Service: Endoscopy;  Laterality: N/A;  . Esophagogastroduodenoscopy N/A 02/08/2013    Procedure: ESOPHAGOGASTRODUODENOSCOPY (EGD);  Surgeon: Larry [name];  Location: [facility] ENDOSCOPY;  Service: Endoscopy;  Laterality: N/A;  . Laparotomy N/A 02/12/2013    Procedure: EXPLORATORY LAPAROTOMY PARTIAL COLECTOMY WITH COLOSTOMY;  Surgeon: Larry [name];  Location: [place];  Service: General;  Laterality: N/A;  . Laparotomy N/A 02/18/2013    Procedure: EXPLORATORY LAPAROTOMY/Closure of Wound;  Surgeon: Larry [name];  Location: [place];  Service: General;  Laterality: N/A;   Prior to Admission medications   Medication Sig Start Date End Date Taking? Authorizing Provider  aspirin EC 325 MG tablet Take 325 mg by mouth daily.  Yes Historical Provider, [name]  carvedilol (COREG) 25 MG tablet Take 25 mg by mouth daily.   Yes Historical Provider, [name]  clopidogrel (PLAVIX) 75 MG tablet Take 75 mg by mouth daily with breakfast.   Yes Historical Provider, [name]  diltiazem (CARDIZEM CD) 240 MG 24 hr capsule Take 240 mg by mouth daily.   Yes Historical Provider, [name]  furosemide (LASIX) 40 MG tablet Take 40 mg by mouth 2 (two) times daily.   Yes Historical Provider, [name]  levalbuterol [facility] HFA) 45 MCG/ACT inhaler Inhale 1-2  puffs into the lungs every 4 (four) hours as needed for wheezing.   Yes Historical Provider, [name]  lisinopril (PRINIVIL,ZESTRIL) 20 MG tablet Take 20 mg by mouth daily.   Yes Historical Provider, [name]  nitroGLYCERIN (NITROSTAT) 0.4 MG SL tablet Place 0.4 mg under the tongue every 5 (five) minutes as needed for chest pain.   Yes Historical Provider, [name]  potassium chloride SA (K-DUR,KLOR-CON) 20 MEQ tablet Take 20 mEq by mouth daily.   Yes Historical Provider, [name]  rosuvastatin (CRESTOR) 40 MG tablet Take 40 mg by mouth daily.   Yes Historical Provider, [name]   No Known Allergies  FAMILY HISTORY:  Family History  Problem Relation Age of Onset  . Hypertension Father   . Heart disease Father   . Diabetes Father   . Hypertension Mother    SOCIAL HISTORY:  reports that he quit smoking about 4 years ago. His smoking use included Cigarettes. He smoked 0.00 packs per day. He has never used smokeless tobacco. He reports that he does not drink alcohol or use illicit drugs.  REVIEW OF SYSTEMS:   General: no fevers, chills, changes in appetite Skin: no rash HEENT: no blurry vision, hearing changes, sore throat Pulm: no coughing, wheezing CV: no palpitations Abd: no abdominal pain, nausea/vomiting, diarrhea/constipation GU: no dysuria, hematuria, polyuria Ext: no arthralgias, myalgias Neuro: no weakness, numbness, or tingling  VITAL SIGNS: Temp:  [97.6 F (36.4 C)-98.4 F (36.9 C)] 98.3 F (36.8 C) (01/04 1200) Pulse Rate:  [43-124] 79 (01/04 1200) Resp:  [11-21] 13 (01/04 1200) BP: (98-172)/(57-106) 131/81 mmHg (01/04 1200) SpO2:  [95 %-100 %] 100 % (01/04 1200) Weight:  [298 lb 1 oz (135.2 kg)] 298 lb 1 oz (135.2 kg) (01/04 0435) HEMODYNAMICS:   INTAKE / OUTPUT: Intake/Output     01/03 0701 - 01/04 0700 01/04 0701 - 01/05 0700   P.O. 480 120   I.V. (mL/kg) 1800 (13.3) 445 (3.3)   IV Piggyback 100 450   Total Intake(mL/kg) 2380 (17.6) 1015 (7.5)   Urine (mL/kg/hr) 3925 (1.2) 2100 (2.3)    Drains 35 (0)    Stool 200 (0.1)    Total Output 4160 2100   Net -1780 -1085        Urine Occurrence 3 x      PHYSICAL EXAMINATION: General: alert, cooperative, and in no apparent distress HEENT: pupils equal round and reactive to light, vision grossly intact, oropharynx clear and non-erythematous  Neck: supple Lungs: clear to ascultation bilaterally, normal work of respiration, no wheezes, rales, ronchi Heart: regular rate and rhythm, no murmurs, gallops, or rubs Abdomen: soft, non-tender, non-distended, normal bowel sounds Extremities: no cyanosis, clubbing, or edema Neurologic: alert & oriented X3, cranial nerves II-XII intact, strength grossly intact, sensation intact to light touch  LABS:  CBC  Recent Labs Lab 02/21/13 0455 02/22/13 0529 02/24/13 1038  WBC 4.4 4.4 6.8  HGB 8.6* 9.5* 9.7*  HCT 28.2* 31.0* 31.7*  PLT 224  252 286   Coag's No results found for this basename: APTT, INR,  in the last 168 hours BMET  Recent Labs Lab 02/23/13 0750 02/24/13 0415 02/24/13 1038  NA 142 142 142  K 3.5* 3.4* 4.2  CL 105 107 106  CO2 22 21 24   BUN 5* 3* 3*  CREATININE 0.98 1.00 0.99  GLUCOSE 89 126* 91   Electrolytes  Recent Labs Lab 02/23/13 0750 02/24/13 0415 02/24/13 1038  CALCIUM 8.5 8.7 8.7  MG  --  1.7 2.3   Cardiac Enzymes  Recent Labs Lab 02/24/13 0415 02/24/13 1038  TROPONINI <0.30 <0.30   Glucose  Recent Labs Lab 02/20/13 2329 02/21/13 0356 02/21/13 0840 02/21/13 1125 02/21/13 1756 02/21/13 2012  GLUCAP 81 74 78 84 81 87    Imaging Dg Abd Portable 1v  02/23/2013   CLINICAL DATA:  Lower abdominal pain  EXAM: PORTABLE ABDOMEN - 1 VIEW  COMPARISON:  MR ABDOMEN W/ CM dated 02/13/2013; CT CHEST W/CM dated 02/08/2013  FINDINGS: Small amount residual oral contrast within the colon from CT of 02/09/2003. There are mildly dilated loops of small bowel in the left lower quadrant measuring up to 4 cm. . No evidence of intraperitoneal free air on  this supine exam.  IMPRESSION: 1. Dilated loops of small bowel in the left lower quadrant consistent with focal ileus or partial obstruction.  2. No evidence of high-grade obstruction. There is contrast within the rectum from CT of 02/08/2013.   Electronically Signed   By: Suzy Bouchard M.D.   On: 02/23/2013 12:38    ASSESSMENT / PLAN:  PULMONARY A: COPD, no evidence for exacerbation P:   -continue xopenex/ipratropium prn for dyspnea -consider starting long-term inhaler, such as advair -resp culture negative 12/31  CARDIOVASCULAR A:  Cardiac Arrest CHF (EF 45-50%) NSVT Prolonged QT Atrial tachycardia P:  -Cardiology managing; on lidocaine drip -continue aspirin  RENAL A:   AKI - Resolved P:   -follow BMET  GASTROINTESTINAL A:  Colonic adenocarcinoma - LN negative, though CT showed potential liver lesions Ostomy in place Nutrition P:   -GI and surgery managing adenocarcinoma -Clear liquid diet -likely outpatient oncology follow-up  HEMATOLOGIC A:   Anemia - likely a combination of anemia of chronic disease and blood loss from antral ulcers/colon cancer.  Pt has received 4 units of pRBC's this admission P:  -continue to follow CBC's -consider iron supplementation -lovenox for DVT ppx  INFECTIOUS A:  No active issues P:    ENDOCRINE A:   No active issues P:     NEUROLOGIC A:  No active issues P:     TODAY'S SUMMARY: Patient transferred to the ICU yesterday due to cardiac arrest.  Staying overnight in ICU for observation.  Will transfer care back to [facility] when pt stable for SDU.  I have personally obtained a history, examined the patient, evaluated laboratory and imaging results, formulated the assessment and plan and placed orders. CRITICAL CARE: The patient is critically ill with multiple organ systems failure and requires high complexity decision making for assessment and support, frequent evaluation and titration of therapies, application of  advanced monitoring technologies and extensive interpretation of multiple databases. Critical Care Time devoted to patient care services described in this note is 45 minutes.    Elnora Morrison, PGY3 Pgr. [phone] 02/24/2013, 1:40 PM

## 2013-02-24 NOTE — Progress Notes (Signed)
Called to see patient secondary to cardiac arrest on the floor.  Apparently central telemetry called nurse to check on patient due to monitor showing VT/Vfib.  The nurse found him unconscious and not breathing and CPR was started.  The patient was then shocked once into afib with RVR and became responsive.  He was transferred to [facility].  Currently he is fully responsive and appropriate.  He denies any chest pain or SOB although feels a little pressure in his chest currently.  His potassium yesterday was mildly low at 3.5.  Recent  2D echo showed low normal LVF with EF 50%.  EKG shows atrial fibrillation with RVR at 103bpm with diffuse nonspecific T wave abnormality and very prolonged QTc - ? Metabolic derangement.   Will continue current meds.  Cycle cardiac enzymes.  Check stat BMET.  Stat ddimer with recent surgery.  No Heparin at present due to recent colon surgery.  Discussed with Dr. Martinique and will not take to cath lab at this point due to recent surgery and no ST elevation on EKG.

## 2013-02-24 NOTE — Consult Note (Signed)
Attending:  I have seen and examined the patient with nurse practitioner/resident and agree with the note above.  MCQUAID, DOUGLAS Kennan PCCM Pager: [phone] Cell: [phone] If no response, call [phone]  

## 2013-02-24 NOTE — Code Documentation (Signed)
  Patient Name: Larry [name]   MRN: [id number]   Date of Birth/ Sex: 1950-09-04 , male      Admission Date: 02/02/2013  Attending Provider: Cristal Ford, DO  Primary Diagnosis: Other and unspecified hyperlipidemia   Indication: Pt was in his usual state of health until this AM, when he was noted to be Vfib by telemetry.  RN was called by telemetry and found patient unresponsive. Code blue was subsequently called. At the time of arrival on scene, ACLS protocol was underway.   Technical Description:  - CPR performance duration:  3:47a-5:52a  - Was defibrillation or cardioversion used? Yes   - Was external pacer placed? No  - Was patient intubated pre/post CPR? No   Medications Administered: Y = Yes; Blank = No NO meds administered  Amiodarone    Atropine    Calcium    Epinephrine    Lidocaine    Magnesium    Norepinephrine    Phenylephrine    Sodium bicarbonate    Vasopressin     Post CPR evaluation:  - Final Status - Was patient successfully resuscitated ? Yes - What is current rhythm? Sinus Tachycardia (106) - What is current hemodynamic status? Stable, awake, oriented to person and place 146/94  Miscellaneous Information:  - Labs sent, including: Trop, BMET, Mg, CBC  - Primary team notified?  Yes, at bedside  - Family Notified? Per primary        Othella Boyer, MD  02/24/2013, 4:07 AM

## 2013-02-25 DIAGNOSIS — I469 Cardiac arrest, cause unspecified: Secondary | ICD-10-CM

## 2013-02-25 DIAGNOSIS — D649 Anemia, unspecified: Secondary | ICD-10-CM

## 2013-02-25 LAB — BASIC METABOLIC PANEL
CO2: 25 mEq/L (ref 19–32)
Calcium: 8.9 mg/dL (ref 8.4–10.5)
Chloride: 107 mEq/L (ref 96–112)
Creatinine, Ser: 0.99 mg/dL (ref 0.50–1.35)
GFR, EST NON AFRICAN AMERICAN: 87 mL/min — AB (ref >90)
Glucose, Bld: 94 mg/dL (ref 70–99)
POTASSIUM: 3.5 meq/L — AB (ref 3.7–5.3)
Sodium: 143 mEq/L (ref 137–147)

## 2013-02-25 LAB — MAGNESIUM: Magnesium: 2 mg/dL (ref 1.5–2.5)

## 2013-02-25 MED ORDER — LIDOCAINE IN D5W 4-5 MG/ML-% IV SOLN
1.0000 mg/min | INTRAVENOUS | Status: DC
  Administered 2013-02-25 – 2013-02-26 (×2): 2 mg/min via INTRAVENOUS
  Filled 2013-02-25: qty 500

## 2013-02-25 MED ORDER — POTASSIUM CHLORIDE 20 MEQ/15ML (10%) PO LIQD
40.0000 meq | Freq: Two times a day (BID) | ORAL | Status: DC
  Administered 2013-02-25 – 2013-02-27 (×4): 40 meq via ORAL
  Filled 2013-02-25 (×5): qty 30

## 2013-02-25 MED FILL — Medication: Qty: 1 | Status: AC

## 2013-02-25 NOTE — Progress Notes (Signed)
Patient ID: EVERN ENWRIGHT, male   DOB: 1950-04-09, 63 y.o.   MRN: [id number]   SUBJECTIVE: Patient had cardiac arrest last night: torsades de pointes was rhythm.  He was cardioverted, going into atrial fibrillation.  QT was profoundly prolonged out to 700 msec.  He then went into torsades again around 6:30 am and was again cardioverted, this time going into NSR.  Currently he has some very slight soreness at the defibrillation site but otherwise not chest pain.  Actually feels fine right now.   [name] Kitchen aspirin  81 mg Oral Daily  . enoxaparin (LOVENOX) injection  60 mg Subcutaneous Q24H  . ferrous sulfate  325 mg Oral Q breakfast  . pantoprazole  40 mg Oral QHS  . potassium chloride  40 mEq Oral Daily  lidocaine gtt @1  mg/min     Filed Vitals:   02/25/13 0700 02/25/13 0758 02/25/13 0800 02/25/13 0900  BP: 149/104 145/92 147/97 150/98  Pulse: 97 94 93 88  Temp:  98.2 F (36.8 C)    TempSrc:  Oral    Resp: 12 13 15 14   Height:      Weight:      SpO2: 99% 100% 100% 99%    Intake/Output Summary (Last 24 hours) at 02/25/13 0915 Last data filed at 02/25/13 0900  Gross per 24 hour  Intake 6462.17 ml  Output   2930 ml  Net 3532.17 ml    LABS: Basic Metabolic Panel:  Recent Labs  02/24/13 1038 02/25/13 0429  NA 142 143  K 4.2 3.5*  CL 106 107  CO2 24 25  GLUCOSE 91 94  BUN 3* <3*  CREATININE 0.99 0.99  CALCIUM 8.7 8.9  MG 2.3 2.0   Liver Function Tests: No results found for this basename: AST, ALT, ALKPHOS, BILITOT, PROT, ALBUMIN,  in the last 72 hours No results found for this basename: LIPASE, AMYLASE,  in the last 72 hours CBC:  Recent Labs  02/24/13 1038  WBC 6.8  HGB 9.7*  HCT 31.7*  MCV 89.8  PLT 286   Cardiac Enzymes:  Recent Labs  02/24/13 1038 02/24/13 1525 02/24/13 1846  TROPONINI <0.30 <0.30 <0.30   BNP: No components found with this basename: POCBNP,  D-Dimer:  Recent Labs  02/24/13 0555  DDIMER 7.13*   Hemoglobin A1C: No results  found for this basename: HGBA1C,  in the last 72 hours Fasting Lipid Panel: No results found for this basename: CHOL, HDL, LDLCALC, TRIG, CHOLHDL, LDLDIRECT,  in the last 72 hours Thyroid Function Tests: No results found for this basename: TSH, T4TOTAL, FREET3, T3FREE, THYROIDAB,  in the last 72 hours Anemia Panel: No results found for this basename: VITAMINB12, FOLATE, FERRITIN, TIBC, IRON, RETICCTPCT,  in the last 72 hours  RADIOLOGY: Ct Chest W Contrast  02/08/2013   CLINICAL DATA:  Mass in transverse colon  EXAM: CT CHEST, ABDOMEN, AND PELVIS WITH CONTRAST  TECHNIQUE: Multidetector CT imaging of the chest, abdomen and pelvis was performed following the standard protocol during bolus administration of intravenous contrast.  CONTRAST:  158mL OMNIPAQUE IOHEXOL 300 MG/ML  SOLN  COMPARISON:  100 cc of Omni 300  FINDINGS: CT CHEST FINDINGS  There is no pleural effusion identified. Subpleural nodule within the anterior right upper lobe measures 5 mm, image 33/series 3. Subpleural scar is identified in the right upper lobe, image 28/series 3.  The trachea appears patent and midline. The heart size is normal. Calcified atherosclerotic disease affects the thoracic aorta as well as the  RCA, LAD and left circumflex coronary artery. No pericardial effusion noted. There is no mediastinal or hilar adenopathy. No axillary or supraclavicular adenopathy identified.  Review of the visualized bony structures is significant for mild multilevel thoracic spondylosis.  CT ABDOMEN AND PELVIS FINDINGS  There are several small, indeterminate low attenuation structures within the liver parenchyma. These are too small to characterize. Index hypodensity within the right hepatic lobe measures 6 mm, image number 58/series 2. Near the dome of liver there is a 5 mm hypodensity, image 51/series 2. Posterior right hepatic lobe hypodensity measures 5 mm, image 62/series 2. The gallbladder appears normal. There is no biliary dilatation.  Normal appearance of the pancreas. The spleen is unremarkable.  Normal appearance of the adrenal glands. Left renal cyst identified. Bilateral renal hypodensities are noted likely representing cysts. 3.8 cm diverticula arises from the right posterior bladder wall. Calcifications within the prostate gland are identified.  Calcified atherosclerotic disease affects the abdominal aorta. There is no aneurysm. No retroperitoneal adenopathy identified. No pelvic or inguinal adenopathy noted.  Normal appearance of the stomach. The small bowel loops are within normal limits circumferential mass involving the mid transverse colon is identified, image number 82/series 2. This measures approximately 6 cm in length. There is increased caliber of the proximal large bowel suggesting partial obstruction. No ascites or peritoneal nodularity identified.  Review of the visualized osseous structures is significant for mild lumbar spondylosis. No aggressive lytic or sclerotic bone lesions identified.  IMPRESSION: CT chest:  1. Circumferential mass involving the mid transverse colon is identified compatible with colon carcinoma. 2. Subpleural nodule in the right upper lobe measures 5 mm. 3. Several low-attenuation foci are identified within the liver parenchyma. These are too small to reliably characterize. In a patient with a new diagnosis of colon cancer I would suggest more definitive characterization with contrast enhanced MRI of the liver.   Electronically Signed   By: Kerby Moors M.D.   On: 02/08/2013 14:46   Ct Abdomen Pelvis W Contrast  02/08/2013   CLINICAL DATA:  Mass in transverse colon  EXAM: CT CHEST, ABDOMEN, AND PELVIS WITH CONTRAST  TECHNIQUE: Multidetector CT imaging of the chest, abdomen and pelvis was performed following the standard protocol during bolus administration of intravenous contrast.  CONTRAST:  165mL OMNIPAQUE IOHEXOL 300 MG/ML  SOLN  COMPARISON:  100 cc of Omni 300  FINDINGS: CT CHEST FINDINGS   There is no pleural effusion identified. Subpleural nodule within the anterior right upper lobe measures 5 mm, image 33/series 3. Subpleural scar is identified in the right upper lobe, image 28/series 3.  The trachea appears patent and midline. The heart size is normal. Calcified atherosclerotic disease affects the thoracic aorta as well as the RCA, LAD and left circumflex coronary artery. No pericardial effusion noted. There is no mediastinal or hilar adenopathy. No axillary or supraclavicular adenopathy identified.  Review of the visualized bony structures is significant for mild multilevel thoracic spondylosis.  CT ABDOMEN AND PELVIS FINDINGS  There are several small, indeterminate low attenuation structures within the liver parenchyma. These are too small to characterize. Index hypodensity within the right hepatic lobe measures 6 mm, image number 58/series 2. Near the dome of liver there is a 5 mm hypodensity, image 51/series 2. Posterior right hepatic lobe hypodensity measures 5 mm, image 62/series 2. The gallbladder appears normal. There is no biliary dilatation. Normal appearance of the pancreas. The spleen is unremarkable.  Normal appearance of the adrenal glands. Left renal cyst identified. Bilateral  renal hypodensities are noted likely representing cysts. 3.8 cm diverticula arises from the right posterior bladder wall. Calcifications within the prostate gland are identified.  Calcified atherosclerotic disease affects the abdominal aorta. There is no aneurysm. No retroperitoneal adenopathy identified. No pelvic or inguinal adenopathy noted.  Normal appearance of the stomach. The small bowel loops are within normal limits circumferential mass involving the mid transverse colon is identified, image number 82/series 2. This measures approximately 6 cm in length. There is increased caliber of the proximal large bowel suggesting partial obstruction. No ascites or peritoneal nodularity identified.  Review of the  visualized osseous structures is significant for mild lumbar spondylosis. No aggressive lytic or sclerotic bone lesions identified.  IMPRESSION: CT chest:  1. Circumferential mass involving the mid transverse colon is identified compatible with colon carcinoma. 2. Subpleural nodule in the right upper lobe measures 5 mm. 3. Several low-attenuation foci are identified within the liver parenchyma. These are too small to reliably characterize. In a patient with a new diagnosis of colon cancer I would suggest more definitive characterization with contrast enhanced MRI of the liver.   Electronically Signed   By: Kerby Moors M.D.   On: 02/08/2013 14:46   US Renal  02/02/2013   CLINICAL DATA:  Renal failure  EXAM: RENAL/URINARY TRACT ULTRASOUND COMPLETE  COMPARISON:  None.  FINDINGS: Right Kidney:  Length: 11.6 cm. Echogenic renal parenchyma, suggesting medical renal disease. 1.4 x 1.1 x 1.2 cm upper pole cyst. No hydronephrosis.  Left Kidney:  Length: 11.7 cm. Echogenic renal parenchyma, suggesting medical renal disease. 2.1 x 2.0 x 2.0 cm upper pole cyst. No hydronephrosis.  Bladder:  Decompressed by indwelling Foley catheter.  IMPRESSION: Echogenic renal parenchyma, suggesting medical renal disease.  Bilateral renal cysts.  No hydronephrosis.   Electronically Signed   By: Julian Hy M.D.   On: 02/02/2013 20:59   Mr Liver W Contrast  02/14/2013   CLINICAL DATA:  Colonic mass, presumably colon cancer, at prior imaging, liver masses on prior CT  EXAM: MRI ABDOMEN WITHOUT CONTRAST  TECHNIQUE: Multiplanar multisequence MR imaging of the abdomen was performed after the administration of intravenous contrast.  CONTRAST:  10 cc Eovist IV contrast  COMPARISON:  CT 02/08/2013  FINDINGS: Small bilateral pleural effusions are identified. Evidence of midline abdominal incision and diverting right lower quadrant colostomy. Motion artifact is present on multiple series. This essentially obscures detail of the  previously described sub cm hepatic lesions, although no enhancement is identified at the largest of these masses measuring 6 mm within the right hepatic lobe, for example image 35 series 503 allowing for motion. On T2 weighted images, the previously seen sub cm hypodense lesions in the right hepatic lobe appear T2 hyperintense. The dome of medial segment left hepatic lobe 3 mm lesion appears T2 hyperintense but is too small for definitive characterization on postcontrast imaging allowing for motion.  Adrenal glands, spleen, pancreas, and gallbladder are normal. No ascites. Bilateral renal cortical cysts are noted.  IMPRESSION: Hepatic T2 hyperintense lesions are identified most compatible with biliary cysts or hamartomas. Because of patient motion, only the largest of these measuring 6 mm in the posterior segment right hepatic lobe can be relatively confidently identified as lacking internal contrast enhancement. The others are too small for postcontrast evaluation due to patient motion. Further workup is as per the overall patient's periodic restaging plan.  Bilateral small pleural effusions.  Interval diverting right lower quadrant colostomy.   Electronically Signed   By: Elzie Rings  Green M.D.   On: 02/14/2013 09:09   Dg Chest Port 1 View  02/19/2013   CLINICAL DATA:  Cough, congestion  EXAM: PORTABLE CHEST - 1 VIEW  COMPARISON:  02/18/2013  FINDINGS: The cardiac silhouette is enlarged. Low lung volumes. The lungs are clear. An NG tube is seen tip projecting in the expected region of the stomach, fundal region. The osseous structures are unremarkable.  IMPRESSION: Low lung volumes, cardiomegaly, no evidence of acute cardiopulmonary disease.   Electronically Signed   By: Margaree Mackintosh M.D.   On: 02/19/2013 08:39   Dg Chest Port 1 View  02/18/2013   CLINICAL DATA:  Postop congestion.  EXAM: PORTABLE CHEST - 1 VIEW  COMPARISON:  02/08/2013 CT chest.  02/03/2012 chest x-ray.  FINDINGS: Cardiomegaly.   Pulmonary vascular congestion most notable centrally.  Question basilar subsegmental atelectasis.  CT detected subpleural nodule right upper lobe not evaluated on present exam.  IMPRESSION: Cardiomegaly.  Pulmonary vascular congestion most notable centrally.  Question basilar subsegmental atelectasis.  CT detected subpleural nodule right upper lobe not evaluated on present exam.   Electronically Signed   By: Chauncey Cruel M.D.   On: 02/18/2013 13:13   Dg Chest Portable 1 View  02/02/2013   CLINICAL DATA:  Weakness, presyncope  EXAM: PORTABLE CHEST - 1 VIEW  COMPARISON:  06/16/2011  FINDINGS: Mildly low lung volumes. There is mild elevation the right hemidiaphragm. Cardiac silhouette is within normal limits. There is no evidence of focal infiltrates, effusions, nor edema. Dextroscoliosis identified within the thoracic spine.  IMPRESSION: No evidence of acute cardiopulmonary disease.   Electronically Signed   By: Margaree Mackintosh M.D.   On: 02/02/2013 17:34   Dg Abd Portable 1v  02/23/2013   CLINICAL DATA:  Lower abdominal pain  EXAM: PORTABLE ABDOMEN - 1 VIEW  COMPARISON:  MR ABDOMEN W/ CM dated 02/13/2013; CT CHEST W/CM dated 02/08/2013  FINDINGS: Small amount residual oral contrast within the colon from CT of 02/09/2003. There are mildly dilated loops of small bowel in the left lower quadrant measuring up to 4 cm. . No evidence of intraperitoneal free air on this supine exam.  IMPRESSION: 1. Dilated loops of small bowel in the left lower quadrant consistent with focal ileus or partial obstruction.  2. No evidence of high-grade obstruction. There is contrast within the rectum from CT of 02/08/2013.   Electronically Signed   By: Suzy Bouchard M.D.   On: 02/23/2013 12:38    PHYSICAL EXAM General: NAD Neck: JVP 8 cm, no thyromegaly or thyroid nodule.  Lungs: Clear to auscultation bilaterally with normal respiratory effort. CV: Nondisplaced PMI.  Heart regular S1/S2, no S3/S4, no murmur.  No peripheral  edema.  No carotid bruit.  Normal pedal pulses.  Abdomen: Soft, nontender, no hepatosplenomegaly, no distention.  Neurologic: Alert and oriented x 3.  Psych: Normal affect. Extremities: No clubbing or cyanosis.   TELEMETRY: Reviewed telemetry pt in NSR with long QT interval, though seems shorter than earlier this morning Echo:   Left ventricle: The cavity size was mildly dilated. Wall thickness was increased in a pattern of mild LVH. Systolic function was mildly to moderately reduced. The estimated ejection fraction was in the range of 40% to 45%. There was akinesis of the apical lateral, apical anterior, and apical septal segments. There was akinesis of the true apex. Prominent trabeculation at the apex but I do not think thrombus was present. Patient had Definity study recently to look at the apex and no  thrombus was seen. Doppler parameters are consistent with abnormal left ventricular relaxation (grade 1 diastolic dysfunction). - Aortic valve: Trileaflet; moderately calcified leaflets. There was no stenosis. - Mitral valve: Moderately calcified annulus. Mildly calcified leaflets . Trivial regurgitation. - Left atrium: The atrium was moderately dilated. - Right ventricle: The cavity size was normal. Systolic function was normal. - Right atrium: The atrium was mildly dilated. - Pulmonary arteries: No complete TR doppler jet so unable to estimate PA systolic pressure. - Systemic veins: IVC was not visualized. - Pericardium, extracardiac: A trivial pericardial effusion was identified.   ASSESSMENT AND PLAN: 63 yo with history of CAD, ischemic cardiomyopathy last EF 45-50%, h/o atrial tachycardia and NSVT, and colon cancer is now s/p abdominal surgery for colon cancer excision.  Post-op, he had some atrial tachycardia and had gone back into NSR on diltiazem.     1. Torsades: In setting of very long QT interval (>700 msec).  He was not bradycardic.  K and Mg mildly low.  No new  drugs started that would prolong QT interval.  No chest pain.  I think cardiac ischemia is not particularly likely as he has had no anginal symptoms,      Will repeat ECG today.  K is still low.   Will increase kcl to BID.   echo shows EF of 40-45%   - lidocaine gtt, start at 1 mg/min. - HR has been elevated, 90s-100s.      2. H/o CAD: As above, no anginal symptoms.  On ASA.  Troponins are negative.    Echos shows no worsening of LV function.    No indication for cath at this point.   3. Atrial fibrillation/atrial tachy: He was in atrial fibrillation after initial cardiac arrest with cardioversion, but now in NSR after 2nd cardioversion.   Darden Amber. 02/25/2013 9:15 AM

## 2013-02-25 NOTE — Consult Note (Signed)
WOC ostomy follow up Stoma type/location: RUQ, end colostomy Stomal assessment/size: 1 3/4" round, slightly budded from the skin  Peristomal assessment: pouch intact and under VAC drape, with planned VAC change tom.  Treatment options for stomal/peristomal skin: none needed Output liquid brown effluent and flatus Ostomy pouching: 2pc. 2 1/4 in place. Will order more supplies for patient, only has larger 2 3/4" in the room and with VAC/midline wound would like to use smaller wafer Education provided: reviewed previous education with patient. Spoke with wife via phone, planned teaching session with her on Wednesday of this week to review pouch change.    [place] team will follow along with you for ostomy care and teaching [facility] RN,CWOCN [phone]

## 2013-02-25 NOTE — Progress Notes (Addendum)
Patient ID: Larry [name], male   DOB: 1950/10/16, 63 y.o.   MRN: [id number] 7 Days Post-Op  Subjective: Pt feels well today.  No further issues yesterday.  Tolerating clear liquids with no nausea or vomiting.    Objective: Vital signs in last 24 hours: Temp:  [98 F (36.7 C)-98.3 F (36.8 C)] 98.2 F (36.8 C) (01/05 0758) Pulse Rate:  [79-97] 94 (01/05 0758) Resp:  [11-17] 13 (01/05 0758) BP: (118-149)/(46-104) 145/92 mmHg (01/05 0758) SpO2:  [97 %-100 %] 100 % (01/05 0758) Last BM Date: 02/24/13  Intake/Output from previous day: 01/04 0701 - 01/05 0700 In: 6577.2 [P.O.:660; I.V.:5467.2; IV Piggyback:450] Out: 7829 [Urine:3450; Stool:30] Intake/Output this shift:    PE: Abd: soft, +BS, ostomy with good air output, stoma is viable, less tender, VAC intact.  Will need to be changed today.  Lab Results:   Recent Labs  02/24/13 1038  WBC 6.8  HGB 9.7*  HCT 31.7*  PLT 286   BMET  Recent Labs  02/24/13 1038 02/25/13 0429  NA 142 143  K 4.2 3.5*  CL 106 107  CO2 24 25  GLUCOSE 91 94  BUN 3* <3*  CREATININE 0.99 0.99  CALCIUM 8.7 8.9   PT/INR No results found for this basename: LABPROT, INR,  in the last 72 hours CMP     Component Value Date/Time   NA 143 02/25/2013 0429   K 3.5* 02/25/2013 0429   CL 107 02/25/2013 0429   CO2 25 02/25/2013 0429   GLUCOSE 94 02/25/2013 0429   BUN <3* 02/25/2013 0429   CREATININE 0.99 02/25/2013 0429   CALCIUM 8.9 02/25/2013 0429   PROT 6.0 02/14/2013 0040   ALBUMIN 2.5* 02/14/2013 0040   AST 7 02/14/2013 0040   ALT 6 02/14/2013 0040   ALKPHOS 72 02/14/2013 0040   BILITOT 2.1* 02/14/2013 0040   GFRNONAA 87* 02/25/2013 0429   GFRAA >90 02/25/2013 0429   Lipase     Component Value Date/Time   LIPASE 15 11/05/2008 1215       Studies/Results: Dg Abd Portable 1v  02/23/2013   CLINICAL DATA:  Lower abdominal pain  EXAM: PORTABLE ABDOMEN - 1 VIEW  COMPARISON:  MR ABDOMEN W/ CM dated 02/13/2013; CT CHEST W/CM dated 02/08/2013   FINDINGS: Small amount residual oral contrast within the colon from CT of 02/09/2003. There are mildly dilated loops of small bowel in the left lower quadrant measuring up to 4 cm. . No evidence of intraperitoneal free air on this supine exam.  IMPRESSION: 1. Dilated loops of small bowel in the left lower quadrant consistent with focal ileus or partial obstruction.  2. No evidence of high-grade obstruction. There is contrast within the rectum from CT of 02/08/2013.   Electronically Signed   By: Suzy Bouchard M.D.   On: 02/23/2013 12:38    Anti-infectives: Anti-infectives   Start     Dose/Rate Route Frequency Ordered Stop   02/18/13 1057  ANCEF 1 gram in 0.9% normal saline 500 mL  Status:  Discontinued       As needed 02/18/13 1057 02/18/13 1152   02/12/13 2330  cefoTEtan (CEFOTAN) 1 g in dextrose 5 % 50 mL IVPB     1 g 100 mL/hr over 30 Minutes Intravenous  Once 02/12/13 1717 02/13/13 0024   02/12/13 0600  [MAR Hold]  cefoTEtan (CEFOTAN) 2 g in dextrose 5 % 50 mL IVPB     (On MAR Hold since 02/12/13 1206)   2 g  100 mL/hr over 30 Minutes Intravenous On call to O.R. 02/11/13 1706 02/12/13 1216       Assessment/Plan 1. POD 13/7 from Hartman's procedure with later washout and closure for dehiscence  2. Post op ileus, improving  3. Stage 2 colonic adenocarcinoma, T4, No, Mx 4. V fib arrest  Plan: 1. VAC changed last night due to clogged tubing.  Will chagne VAC schedule to T,R,S. 2. Informed patient of his pathology.  He will need follow up with the oncologist.  Would prefer GI oncologist, such as Dr. Benay Spice.  3. Will advance to full liquids today 4. If cardiology and primary are ok with mobilization, would like to for the patient to begin mobilizing today at least 3 times a day.    LOS: 23 days    Joyce Heitman E 02/25/2013, 8:18 AM Pager: [phone]

## 2013-02-25 NOTE — Progress Notes (Signed)
NUTRITION FOLLOW UP  Intervention:   1.  Supplements; Resource Breeze po TID, each supplement provides 250 kcal and 9 grams of protein 2.  Parenteral nutrition; Pt tolerated advancement to full liquids today, however, If unable to sustain diet and bowel rest needed, should consider TPN as pt has been NPO/clear liquids for several days.    NUTRITION DIAGNOSIS:  Inadequate oral intake related to altered GI function as evidenced by NPO status.   Monitor:   1.  Food/Beverage; pt meeting >/=90% estimated needs with tolerance. 2.  Wt/wt change; monitor trends  Assessment:   Patient presented to the ED on 12/13 after 4 weeks of generalized weakness. CT abd scan showed circumferential mass involving the mid transverse colon, compatible with colon carcinoma. Biopsy on 12/19 showed invasive adenocarcinoma. S/P colectomy and colostomy on 12/23. Developed wound dehiscence; S/P abdominal washout and closure on 12/29.  Pt with prolonged NPO status post-op.  He has readvanced diet and is to start full liquids today. Tolerated 100% of full liquid lunch today.  Pt meets criteria for severe MALNUTRITION in the context of chronic illness as evidenced by 9% weight loss in the past 3 months and intake </= 75% of estimated energy requirement for >/= 1 month.  Height: Ht Readings from Last 1 Encounters:  02/24/13 5' 10.5" (1.791 m)    Weight Status:   Wt Readings from Last 1 Encounters:  02/24/13 298 lb 1 oz (135.2 kg)    Re-estimated needs:  Kcal: 2400-2600  Protein: 140-150 gm  Fluid: 2.4-2.6 L  Skin:  Non-pitting edema Abdominal incision  Diet Order: Full Liquid   Intake/Output Summary (Last 24 hours) at 02/25/13 0952 Last data filed at 02/25/13 0900  Gross per 24 hour  Intake 6362.17 ml  Output   2530 ml  Net 3832.17 ml    Last BM: 1/4  Labs:   Recent Labs Lab 02/24/13 0415 02/24/13 1038 02/25/13 0429  NA 142 142 143  K 3.4* 4.2 3.5*  CL 107 106 107  CO2 21 24 25   BUN 3*  3* <3*  CREATININE 1.00 0.99 0.99  CALCIUM 8.7 8.7 8.9  MG 1.7 2.3 2.0  GLUCOSE 126* 91 94    CBG (last 3)  No results found for this basename: GLUCAP,  in the last 72 hours  Scheduled Meds: . aspirin  81 mg Oral Daily  . enoxaparin (LOVENOX) injection  60 mg Subcutaneous Q24H  . ferrous sulfate  325 mg Oral Q breakfast  . pantoprazole  40 mg Oral QHS  . potassium chloride  40 mEq Oral BID    Continuous Infusions: . sodium chloride 75 mL/hr at 02/25/13 0700  . lactated ringers 100 mL/hr at 02/16/13 0108  . lactated ringers 20 mL/hr at 02/25/13 0700  . lidocaine 2 mg/min (02/25/13 0800)    Brynda Greathouse, MS RD LDN Clinical Inpatient Dietitian Pager: [phone] Weekend/After hours pager: [phone]

## 2013-02-25 NOTE — Consult Note (Signed)
Name: Larry [name] MRN: [id number] DOB: 1950-03-11    ADMISSION DATE:  02/02/2013 CONSULTATION DATE:  02/25/12  REFERRING MD :  Internal Medicine PRIMARY SERVICE: PCCM (from [facility])  CHIEF COMPLAINT:  weakness  BRIEF PATIENT DESCRIPTION: 63 yo man, history of HTN, CHF (EF 45-50%), CAD, COPD, NSVT, presenting 12/13 with 4 weeks of generalized weakness, found to have AKI, anemia, and colon adenocarcinoma.  SIGNIFICANT EVENTS / STUDIES:  12/13 - admitted with weakness, Cr = 7.8, hypotensive 12/19 - Colonoscopy showed obstructing mass in transverse colon, EGD showed only some antral erosions.  CT abd showed possible liver mets 12/23 - pt to OR for ex lap partial colectomy w/ colostomy, pathology showed adenocarcinoma with 18 benign lymph nodes. 12/29 - back to OR for wound dehissnace.  Pt developed paroxysmal atrial tach during procedure, started dilt drip 1/4 - pt had cardiac arrest, requiring defibrillation.  QT found to be > 700 msec.  K and Mg replaced, lidocaine drip started  LINES / TUBES: Colostomy 12/23 ETT 12/29>>12/29  CULTURES: MRSA: neg Sputum culture 12/31: normal oropharyngeal flora  ANTIBIOTICS: Cefotetan x1 12/23  SUBJECTIVE: Overnight, the patient experienced 1 self-limited episode of sinus tachycardia, but no other cardiac events.  The patient has no acute complaints this morning, aside from some soreness at his surgical site.  QTc this morning is 511, from 560 yesterday.  Potassium mildly low this morning.  VITAL SIGNS: Temp:  [98 F (36.7 C)-98.3 F (36.8 C)] 98.2 F (36.8 C) (01/05 0758) Pulse Rate:  [79-97] 94 (01/05 0758) Resp:  [11-17] 13 (01/05 0758) BP: (118-149)/(46-104) 145/92 mmHg (01/05 0758) SpO2:  [97 %-100 %] 100 % (01/05 0758) HEMODYNAMICS:   INTAKE / OUTPUT: Intake/Output     01/04 0701 - 01/05 0700 01/05 0701 - 01/06 0700   P.O. 660    I.V. (mL/kg) 5467.2 (40.4)    IV Piggyback 450    Total Intake(mL/kg) 6577.2 (48.6)    Urine (mL/kg/hr) 3450 (1.1)    Drains     Stool 30 (0)    Total Output 3480     Net +3097.2            PHYSICAL EXAMINATION: General: alert, cooperative, and in no apparent distress HEENT: pupils equal round and reactive to light, vision grossly intact, oropharynx clear and non-erythematous  Neck: supple Lungs: clear to ascultation bilaterally, normal work of respiration, no wheezes, rales, ronchi Heart: regular rate and rhythm, no murmurs, gallops, or rubs Abdomen: soft, non-tender, non-distended, normal bowel sounds, wound bandaged Extremities: no cyanosis, clubbing, or edema Neurologic: alert & oriented X3, cranial nerves II-XII intact, strength grossly intact, sensation intact to light touch  LABS:  CBC  Recent Labs Lab 02/21/13 0455 02/22/13 0529 02/24/13 1038  WBC 4.4 4.4 6.8  HGB 8.6* 9.5* 9.7*  HCT 28.2* 31.0* 31.7*  PLT 224 252 286   Coag's No results found for this basename: APTT, INR,  in the last 168 hours BMET  Recent Labs Lab 02/24/13 0415 02/24/13 1038 02/25/13 0429  NA 142 142 143  K 3.4* 4.2 3.5*  CL 107 106 107  CO2 21 24 25   BUN 3* 3* <3*  CREATININE 1.00 0.99 0.99  GLUCOSE 126* 91 94   Electrolytes  Recent Labs Lab 02/24/13 0415 02/24/13 1038 02/25/13 0429  CALCIUM 8.7 8.7 8.9  MG 1.7 2.3 2.0   Cardiac Enzymes  Recent Labs Lab 02/24/13 1038 02/24/13 1525 02/24/13 1846  TROPONINI <0.30 <0.30 <0.30   Glucose  Recent Labs Lab 02/20/13 2329 02/21/13 0356 02/21/13 0840 02/21/13 1125 02/21/13 1756 02/21/13 2012  GLUCAP 81 74 78 84 81 87    Imaging Dg Abd Portable 1v  02/23/2013   CLINICAL DATA:  Lower abdominal pain  EXAM: PORTABLE ABDOMEN - 1 VIEW  COMPARISON:  MR ABDOMEN W/ CM dated 02/13/2013; CT CHEST W/CM dated 02/08/2013  FINDINGS: Small amount residual oral contrast within the colon from CT of 02/09/2003. There are mildly dilated loops of small bowel in the left lower quadrant measuring up to 4 cm. . No evidence of  intraperitoneal free air on this supine exam.  IMPRESSION: 1. Dilated loops of small bowel in the left lower quadrant consistent with focal ileus or partial obstruction.  2. No evidence of high-grade obstruction. There is contrast within the rectum from CT of 02/08/2013.   Electronically Signed   By: Suzy Bouchard M.D.   On: 02/23/2013 12:38    ASSESSMENT / PLAN:  PULMONARY A: COPD, no evidence for exacerbation P:   -continue xopenex/ipratropium prn for dyspnea -resp culture negative 12/31  CARDIOVASCULAR A:  Cardiac Arrest CHF (EF 45-50%) NSVT Prolonged QT Atrial tachycardia P:  -Cardiology managing; on lidocaine drip -close monitoring of potassium/magnesium, with replacement prn -continue aspirin  RENAL A:   AKI - Resolved P:   -follow BMET  GASTROINTESTINAL A:  Colonic adenocarcinoma - LN negative, though CT showed potential liver lesions Ostomy in place Nutrition P:   -GI and surgery managing adenocarcinoma -Full liquid diet per surgery -likely outpatient oncology follow-up  HEMATOLOGIC A:   Anemia - likely a combination of anemia of chronic disease and blood loss from antral ulcers/colon cancer.  Pt has received 4 units of pRBC's this admission P:  -continue to follow CBC's -continue iron supplementation -lovenox for DVT ppx  INFECTIOUS A:  No active issues P:    ENDOCRINE A:   No active issues P:     NEUROLOGIC A:  No active issues P:     Elnora Morrison, PGY3 Pgr. [phone] 02/25/2013, 8:13 AM   Attending:  I have seen and examined the patient with nurse practitioner/resident and agree with the note above.   Doing well Will maintain on ICU status for now while on lidocaine gtt Advance diet per surgery KVO fluids  Jillyn Hidden PCCM Pager: [phone] Cell: [phone] If no response, call [phone]

## 2013-02-25 NOTE — Progress Notes (Signed)
I have seen and examined the pt and agree with PA-Osborne's progress note. Stg 2 colon CA Mobilize Adv diet as tol

## 2013-02-25 NOTE — Progress Notes (Signed)
PT Cancellation Note  Patient Details Name: Larry [name] MRN: [id number] DOB: 1950/04/01   Cancelled Treatment:    Reason Eval/Treat Not Completed: Medical issues which prohibited therapy.  Noted events of 02/24/13.  Noted Surgery note to mobilize if OK with Cardiology and Primary.  *Please clarify when we may begin mobilization with patient.  Thank you!   Despina Pole 02/25/2013, 3:23 PM Carita Pian. Sanjuana Kava, [place] Pager [phone]

## 2013-02-26 DIAGNOSIS — I509 Heart failure, unspecified: Secondary | ICD-10-CM

## 2013-02-26 DIAGNOSIS — I5022 Chronic systolic (congestive) heart failure: Secondary | ICD-10-CM

## 2013-02-26 LAB — BASIC METABOLIC PANEL
BUN: <3 mg/dL — ABNORMAL LOW (ref 6–23)
CO2: 24 mEq/L (ref 19–32)
CREATININE: 1.01 mg/dL (ref 0.50–1.35)
Calcium: 9.1 mg/dL (ref 8.4–10.5)
Chloride: 104 mEq/L (ref 96–112)
GFR calc non Af Amer: 79 mL/min — ABNORMAL LOW (ref >90)
Glucose, Bld: 99 mg/dL (ref 70–99)
POTASSIUM: 3.9 meq/L (ref 3.7–5.3)
Sodium: 140 mEq/L (ref 137–147)

## 2013-02-26 LAB — CBC
HCT: 28.8 % — ABNORMAL LOW (ref 39.0–52.0)
HEMOGLOBIN: 8.8 g/dL — AB (ref 13.0–17.0)
MCH: 27.6 pg (ref 26.0–34.0)
MCHC: 30.6 g/dL (ref 30.0–36.0)
MCV: 90.3 fL (ref 78.0–100.0)
Platelets: 264 10*3/uL (ref 150–400)
RBC: 3.19 MIL/uL — ABNORMAL LOW (ref 4.22–5.81)
RDW: 17.5 % — ABNORMAL HIGH (ref 11.5–15.5)
WBC: 5.1 10*3/uL (ref 4.0–10.5)

## 2013-02-26 MED ORDER — SPIRONOLACTONE 25 MG PO TABS
25.0000 mg | ORAL_TABLET | Freq: Every day | ORAL | Status: DC
  Administered 2013-02-26 – 2013-03-01 (×4): 25 mg via ORAL
  Filled 2013-02-26 (×4): qty 1

## 2013-02-26 MED ORDER — CARVEDILOL 6.25 MG PO TABS
6.2500 mg | ORAL_TABLET | Freq: Two times a day (BID) | ORAL | Status: DC
  Administered 2013-02-26 – 2013-03-01 (×8): 6.25 mg via ORAL
  Filled 2013-02-26 (×9): qty 1

## 2013-02-26 NOTE — Progress Notes (Addendum)
Patient transferred to room 2C04 with belongings, chart, and meds. Report given to [place], Therapist, sports. Patient VSS and has no complaints at this time.   Loni Muse, RN

## 2013-02-26 NOTE — Progress Notes (Signed)
Name: Larry [name] MRN: [id number] DOB: 18-Feb-1951    ADMISSION DATE:  02/02/2013 CONSULTATION DATE:  02/25/12  REFERRING MD :  Internal Medicine PRIMARY SERVICE: PCCM (from [facility])  CHIEF COMPLAINT:  weakness  BRIEF PATIENT DESCRIPTION: 63 yo man, history of HTN, CHF (EF 45-50%), CAD, COPD, NSVT, presenting 12/13 with 4 weeks of generalized weakness, found to have AKI, anemia, and colon adenocarcinoma.  Summary of hospitalization (for transfer to [facility]) The patient was admitted 12/13 with 4 weeks of generalized weakness, found to be hypotensive, with an AKI (Cr = 7.8) and anemia (Hb = 7.1).  Cr improved with IV fluids, though anemia required a total of 4 units of pRBC's.  On 12/19, colonoscopy showed an obstructing mass in the transverse colon (EGD the same day showed some antral erosions, which were not actively bleeding).  Follow-up CT abd showed some low attenuation areas in the liver concerning for mets.  The patient was taken to the OR 12/23 for ex lap partial colectomy with coloscopy, with pathology later confirming adenocarcinoma.  18 lymph nodes were resected, but all were benign.  Recovery was complicated by wound dehissance, requiring a return to the OR 12/29.  After the procedure, the patient developed atrial tachycardia, so a dilt drip was started (12/29-1/2).  On 1/4, the patient had a cardiac arrest, requiring defibrillation, and was subsequently transferred to the ICU.  QT was found to be > 700, with mild hypokalemia and hypomagnesemia implicated as the cause.  Lidocaine drip was started.  By 1/6, QTc had decreased to 499, lidocaine drip was d/c'ed, and patient was transferred out of ICU to stepdown.  LINES / TUBES: Colostomy 12/23 ETT 12/29>>12/29  CULTURES: MRSA: neg Sputum culture 12/31: normal oropharyngeal flora  ANTIBIOTICS: Cefotetan x1 12/23  SUBJECTIVE:  No episodes of arrythmia overnight.  QTc this morning = 499.  Patient tolerated full liq diet  yesterday, advancing today per surgery.  Lidocaine drip d/c'ed today.  VITAL SIGNS: Temp:  [98.3 F (36.8 C)-98.6 F (37 C)] 98.6 F (37 C) (01/06 1209) Pulse Rate:  [73-96] 91 (01/06 1209) Resp:  [13-19] 17 (01/06 1209) BP: (115-166)/(80-112) 124/84 mmHg (01/06 1209) SpO2:  [99 %-100 %] 100 % (01/06 1209) HEMODYNAMICS:   INTAKE / OUTPUT: Intake/Output     01/05 0701 - 01/06 0700 01/06 0701 - 01/07 0700   P.O. 600 240   I.V. (mL/kg) 1535 (11.4) 80 (0.6)   IV Piggyback     Total Intake(mL/kg) 2135 (15.8) 320 (2.4)   Urine (mL/kg/hr) 2475 (0.8) 425 (0.4)   Stool 30 (0)    Total Output 2505 425   Net -370 -105          PHYSICAL EXAMINATION: General: alert, cooperative, and in no apparent distress HEENT: pupils equal round and reactive to light, vision grossly intact, oropharynx clear and non-erythematous  Neck: supple Lungs: clear to ascultation bilaterally, normal work of respiration, no wheezes, rales, ronchi Heart: regular rate and rhythm, no murmurs, gallops, or rubs Abdomen: soft, non-tender, non-distended, normal bowel sounds, wound bandaged Extremities: no cyanosis, clubbing, or edema Neurologic: alert & oriented X3, cranial nerves II-XII intact, strength grossly intact, sensation intact to light touch  LABS:  CBC  Recent Labs Lab 02/22/13 0529 02/24/13 1038 02/26/13 0500  WBC 4.4 6.8 5.1  HGB 9.5* 9.7* 8.8*  HCT 31.0* 31.7* 28.8*  PLT 252 286 264   Coag's No results found for this basename: APTT, INR,  in the last 168 hours BMET  Recent Labs Lab 02/24/13 1038 02/25/13 0429 02/26/13 0500  NA 142 143 140  K 4.2 3.5* 3.9  CL 106 107 104  CO2 24 25 24   BUN 3* <3* <3*  CREATININE 0.99 0.99 1.01  GLUCOSE 91 94 99   Electrolytes  Recent Labs Lab 02/24/13 0415 02/24/13 1038 02/25/13 0429 02/26/13 0500  CALCIUM 8.7 8.7 8.9 9.1  MG 1.7 2.3 2.0  --    Cardiac Enzymes  Recent Labs Lab 02/24/13 1038 02/24/13 1525 02/24/13 1846  TROPONINI  <0.30 <0.30 <0.30   Glucose  Recent Labs Lab 02/20/13 2329 02/21/13 0356 02/21/13 0840 02/21/13 1125 02/21/13 1756 02/21/13 2012  GLUCAP 81 74 78 84 81 87    Imaging No results found.  ASSESSMENT / PLAN:  PULMONARY A: COPD, no evidence for exacerbation P:   -continue xopenex/ipratropium prn for dyspnea -resp culture negative 12/31  CARDIOVASCULAR A:  Cardiac Arrest 1/4 CHF (EF 45-50%) NSVT Prolonged QT Atrial tachycardia P:  -Cardiology managing; on lidocaine drip -close monitoring of potassium/magnesium, with replacement prn -continue aspirin  RENAL A:   AKI - Resolved P:   -follow BMET  GASTROINTESTINAL A:  Colonic adenocarcinoma - LN negative, though CT showed potential liver lesions Ostomy in place Nutrition P:   -GI and surgery managing adenocarcinoma -advance diet per surgery -likely outpatient oncology follow-up  HEMATOLOGIC A:   Anemia - likely a combination of anemia of chronic disease and blood loss from antral ulcers/colon cancer.  Pt has received 4 units of pRBC's this admission P:  -continue to follow CBC's -continue iron supplementation -lovenox for DVT ppx  INFECTIOUS A:  No active issues P:    ENDOCRINE A:   No active issues P:     NEUROLOGIC A:  No active issues P:    Activity: PT consulted for generalized weakness, secondary to long hospitalization.   Elnora Morrison, PGY3 Pgr. [phone] 02/26/2013, 2:18 PM  Attending:  Doing well Transfer to SDU, [place] service  Simonne Maffucci [place] PCCM Pager: [phone] Cell: [phone] If no response, call [phone]

## 2013-02-26 NOTE — Progress Notes (Signed)
8 Days Post-Op  Subjective: Pt looks good today.  Tol CLD  Objective: Vital signs in last 24 hours: Temp:  [98.2 F (36.8 C)-98.6 F (37 C)] 98.3 F (36.8 C) (01/06 0400) Pulse Rate:  [73-100] 92 (01/06 0700) Resp:  [13-19] 14 (01/06 0700) BP: (109-166)/(44-112) 166/112 mmHg (01/06 0500) SpO2:  [99 %-100 %] 99 % (01/06 0700) Last BM Date: 02/25/13  Intake/Output from previous day: 01/05 0701 - 01/06 0700 In: 2135 [P.O.:600; I.V.:1535] Out: 2505 [Urine:2475; Stool:30] Intake/Output this shift:    General appearance: alert and cooperative GI: s/nt/nd ostomy pink and patent  Lab Results:   Recent Labs  02/24/13 1038 02/26/13 0500  WBC 6.8 5.1  HGB 9.7* 8.8*  HCT 31.7* 28.8*  PLT 286 264   BMET  Recent Labs  02/24/13 1038 02/25/13 0429  NA 142 143  K 4.2 3.5*  CL 106 107  CO2 24 25  GLUCOSE 91 94  BUN 3* <3*  CREATININE 0.99 0.99  CALCIUM 8.7 8.9   PT/INR No results found for this basename: LABPROT, INR,  in the last 72 hours ABG No results found for this basename: PHART, PCO2, PO2, HCO3,  in the last 72 hours  Studies/Results: No results found.  Anti-infectives: Anti-infectives   Start     Dose/Rate Route Frequency Ordered Stop   02/18/13 1057  ANCEF 1 gram in 0.9% normal saline 500 mL  Status:  Discontinued       As needed 02/18/13 1057 02/18/13 1152   02/12/13 2330  cefoTEtan (CEFOTAN) 1 g in dextrose 5 % 50 mL IVPB     1 g 100 mL/hr over 30 Minutes Intravenous  Once 02/12/13 1717 02/13/13 0024   02/12/13 0600  [MAR Hold]  cefoTEtan (CEFOTAN) 2 g in dextrose 5 % 50 mL IVPB     (On MAR Hold since 02/12/13 1206)   2 g 100 mL/hr over 30 Minutes Intravenous On call to O.R. 02/11/13 1706 02/12/13 1216      Assessment/Plan: s/p Procedure(s): EXPLORATORY LAPAROTOMY/Closure of Wound (N/A)  Adv diet as tol Mobilize with able as per Cardiology From surgery standpoint doing very well  LOS: 24 days    Rosario Jacks., [facility] 02/26/2013

## 2013-02-26 NOTE — Progress Notes (Signed)
Patient ID: Larry [name], male   DOB: 01-Oct-1950, 63 y.o.   MRN: [id number]   SUBJECTIVE: Patient had cardiac arrest the evening of  Jan. 4, 2015.  Tele revealed  torsades de pointes  rhythm.  He was cardioverted, going into atrial fibrillation.  QT was profoundly prolonged out to 700 msec.  He then went into torsades again around 6:30 am and was again cardioverted, this time going into NSR.  Currently he has some very slight soreness at the defibrillation site but otherwise not chest pain.  Actually feels fine right now.   His QTc is now better - 499 ms.   No further arrhythmias.  [name] Kitchen aspirin  81 mg Oral Daily  . enoxaparin (LOVENOX) injection  60 mg Subcutaneous Q24H  . ferrous sulfate  325 mg Oral Q breakfast  . pantoprazole  40 mg Oral QHS  . potassium chloride  40 mEq Oral BID  lidocaine gtt @1  mg/min     Filed Vitals:   02/26/13 0500 02/26/13 0600 02/26/13 0700 02/26/13 0732  BP: 166/112   165/106  Pulse: 89 85 92 90  Temp:    98.3 F (36.8 C)  TempSrc:    Oral  Resp: 17 15 14 14   Height:      Weight:      SpO2: 100% 100% 99% 100%    Intake/Output Summary (Last 24 hours) at 02/26/13 0840 Last data filed at 02/26/13 0800  Gross per 24 hour  Intake   2070 ml  Output   2405 ml  Net   -335 ml    LABS: Basic Metabolic Panel:  Recent Labs  02/24/13 1038 02/25/13 0429 02/26/13 0500  NA 142 143 140  K 4.2 3.5* 3.9  CL 106 107 104  CO2 24 25 24   GLUCOSE 91 94 99  BUN 3* <3* <3*  CREATININE 0.99 0.99 1.01  CALCIUM 8.7 8.9 9.1  MG 2.3 2.0  --    Liver Function Tests: No results found for this basename: AST, ALT, ALKPHOS, BILITOT, PROT, ALBUMIN,  in the last 72 hours No results found for this basename: LIPASE, AMYLASE,  in the last 72 hours CBC:  Recent Labs  02/24/13 1038 02/26/13 0500  WBC 6.8 5.1  HGB 9.7* 8.8*  HCT 31.7* 28.8*  MCV 89.8 90.3  PLT 286 264   Cardiac Enzymes:  Recent Labs  02/24/13 1038 02/24/13 1525 02/24/13 1846    TROPONINI <0.30 <0.30 <0.30   BNP: No components found with this basename: POCBNP,  D-Dimer:  Recent Labs  02/24/13 0555  DDIMER 7.13*   Hemoglobin A1C: No results found for this basename: HGBA1C,  in the last 72 hours Fasting Lipid Panel: No results found for this basename: CHOL, HDL, LDLCALC, TRIG, CHOLHDL, LDLDIRECT,  in the last 72 hours Thyroid Function Tests: No results found for this basename: TSH, T4TOTAL, FREET3, T3FREE, THYROIDAB,  in the last 72 hours Anemia Panel: No results found for this basename: VITAMINB12, FOLATE, FERRITIN, TIBC, IRON, RETICCTPCT,  in the last 72 hours  RADIOLOGY:   PHYSICAL EXAM General: NAD Neck: JVP 8 cm, no thyromegaly or thyroid nodule.  Lungs: Clear to auscultation bilaterally with normal respiratory effort. CV: Nondisplaced PMI.  Heart regular S1/S2, no S3/S4, no murmur.  No peripheral edema.  No carotid bruit.  Normal pedal pulses.  Abdomen: Soft, nontender, no hepatosplenomegaly, no distention.  Neurologic: Alert and oriented x 3.  Psych: Normal affect. Extremities: No clubbing or cyanosis.   ECG Jan. 6, 2015:  NSR at 91.  TWI in lateral leads.  QTc is 499 mg    Echo:   Left ventricle: The cavity size was mildly dilated. Wall thickness was increased in a pattern of mild LVH. Systolic function was mildly to moderately reduced. The estimated ejection fraction was in the range of 40% to 45%. There was akinesis of the apical lateral, apical anterior, and apical septal segments. There was akinesis of the true apex. Prominent trabeculation at the apex but I do not think thrombus was present. Patient had Definity study recently to look at the apex and no thrombus was seen. Doppler parameters are consistent with abnormal left ventricular relaxation (grade 1 diastolic dysfunction). - Aortic valve: Trileaflet; moderately calcified leaflets. There was no stenosis. - Mitral valve: Moderately calcified annulus. Mildly calcified  leaflets . Trivial regurgitation. - Left atrium: The atrium was moderately dilated. - Right ventricle: The cavity size was normal. Systolic function was normal. - Right atrium: The atrium was mildly dilated. - Pulmonary arteries: No complete TR doppler jet so unable to estimate PA systolic pressure. - Systemic veins: IVC was not visualized. - Pericardium, extracardiac: A trivial pericardial effusion was identified.   ASSESSMENT AND PLAN: 63 yo with history of CAD, ischemic cardiomyopathy last EF 45-50%, h/o atrial tachycardia and NSVT, and colon cancer is now s/p abdominal surgery for colon cancer excision.  Post-op, he had some atrial tachycardia and had gone back into NSR on diltiazem.     1. Torsades: In setting of very long QT interval (>700 msec).  He was not bradycardic.  K and Mg mildly low.  No new drugs started that would prolong QT interval.  No chest pain.  I think cardiac ischemia is not particularly likely as he has had no anginal symptoms,      K is still slightly low.  Will start Spirinolactone 25 mg  a day.  Will need to watch potassium closely and may need to reduce his KCl replacement tomorrow.    2. H/o CAD: As above, no anginal symptoms.  On ASA.  Troponins are negative.    Echos shows no worsening of LV function.    No indication for cath at this point.   3. Atrial fibrillation/atrial tachy: He was in atrial fibrillation after initial cardiac arrest with cardioversion, but now in NSR after 2nd cardioversion.   4. Chronic systolic CHF:  Will restart Coreg at 6.25 mg BID  Darden Amber. 02/26/2013 8:40 AM

## 2013-02-26 NOTE — Progress Notes (Signed)
Physical Therapy Treatment Patient Details Name: Larry [name] MRN: [id number] DOB: Jul 20, 1950 Today's Date: 02/26/2013 Time: 8416-6063 PT Time Calculation (min): 36 min  PT Assessment / Plan / Recommendation  History of Present Illness Pt went back to OR 12/29 for abd wound dehiscence closure.Pt is a 63 y.o. male adm from home due to generalized weakness.  Patient with colon mass - adenocarcinoma.  Patient s/p colectomy and colostomy 12/23.  On 12/29, patient s/p abd wound dehissance closure with VAC.   PT Comments   Pt admitted with above. Pt currently with functional limitations due to balance and endurance deficits.  Pt will benefit from skilled PT to increase their independence and safety with mobility to allow discharge to the venue listed below.   Follow Up Recommendations  Home health PT;Supervision/Assistance - 24 hour                 Equipment Recommendations  Rolling walker with 5" wheels    Recommendations for Other Services OT consult  Frequency Min 3X/week   Progress towards PT Goals Progress towards PT goals: Progressing toward goals  Plan Current plan remains appropriate    Precautions / Restrictions Precautions Precautions: Fall Precaution Comments: colostomy, wound VAC, abdominal binder Restrictions Weight Bearing Restrictions: No   Pertinent Vitals/Pain VSS, No pain    Mobility  Bed Mobility Bed Mobility: Supine to Sit;Sitting - Scoot to Edge of Bed Supine to Sit: 4: Min assist;With rails Sitting - Scoot to [employer] of Bed: 4: Min guard Sit to Supine: Not Tested (comment) Details for Bed Mobility Assistance: Verbal cues for technique.  Assist to bring trunk to sitting position.Asked nursing to empty colostomy bag prior to ambulation as it was full and leaking.   Transfers Transfers: Sit to Stand;Stand to Sit Sit to Stand: 1: +2 Total assist;From elevated surface;With upper extremity assist;From bed Sit to Stand: Patient Percentage: 70% Stand to Sit:  3: Mod assist;With upper extremity assist;With armrests;To chair/3-in-1 Details for Transfer Assistance: Verbal cues for hand placement.  Assist to rise to standing and for balance initially. Ambulation/Gait Ambulation/Gait Assistance: 4: Min assist (+ 1 to follow with chair) Ambulation Distance (Feet): 140 Feet Assistive device: Rolling walker Ambulation/Gait Assistance Details: Slightly unsteady gait needing incr support initially.  Cues needed for sequencing and safety.   Gait Pattern: Step-through pattern;Decreased stride length;Trunk flexed Gait velocity: slow Stairs: No Wheelchair Mobility Wheelchair Mobility: No    Exercises Other Exercises Other Exercises: reviewed exercises with pt    PT Goals (current goals can now be found in the care plan section)    Visit Information  Last PT Received On: 02/26/13 Assistance Needed: +2 (for chair follow) History of Present Illness: Pt went back to OR 12/29 for abd wound dehiscence closure.Pt is a 63 y.o. male adm from home due to generalized weakness.  Patient with colon mass - adenocarcinoma.  Patient s/p colectomy and colostomy 12/23.  On 12/29, patient s/p abd wound dehissance closure with VAC.    Subjective Data  Subjective: "I will walk."   Cognition  Cognition Arousal/Alertness: Awake/alert Behavior During Therapy: WFL for tasks assessed/performed Overall Cognitive Status: Within Functional Limits for tasks assessed    Balance  Balance Balance Assessed: Yes Static Sitting Balance Static Sitting - Balance Support: Feet supported Static Sitting - Level of Assistance: 5: Stand by assistance Static Standing Balance Static Standing - Balance Support: Bilateral upper extremity supported Static Standing - Level of Assistance: 4: Min assist High Level Balance High Level Balance Activites: Direction  changes High Level Balance Comments: min guard to steady   End of Session PT - End of Session Equipment Utilized During  Treatment: Gait belt;Oxygen Activity Tolerance: Patient limited by fatigue Patient left: in chair;with call bell/phone within reach Nurse Communication: Mobility status        INGOLD,Bray Vickerman 02/26/2013, 4:21 PM [facility] [phone] (pager)

## 2013-02-27 DIAGNOSIS — I472 Ventricular tachycardia: Secondary | ICD-10-CM | POA: Diagnosis not present

## 2013-02-27 DIAGNOSIS — I4721 Torsades de pointes: Secondary | ICD-10-CM | POA: Diagnosis not present

## 2013-02-27 LAB — CBC
HCT: 27.9 % — ABNORMAL LOW (ref 39.0–52.0)
Hemoglobin: 8.6 g/dL — ABNORMAL LOW (ref 13.0–17.0)
MCH: 28 pg (ref 26.0–34.0)
MCHC: 30.8 g/dL (ref 30.0–36.0)
MCV: 90.9 fL (ref 78.0–100.0)
PLATELETS: 254 10*3/uL (ref 150–400)
RBC: 3.07 MIL/uL — AB (ref 4.22–5.81)
RDW: 17.8 % — ABNORMAL HIGH (ref 11.5–15.5)
WBC: 5.4 10*3/uL (ref 4.0–10.5)

## 2013-02-27 LAB — BASIC METABOLIC PANEL
BUN: 3 mg/dL — ABNORMAL LOW (ref 6–23)
CO2: 24 mEq/L (ref 19–32)
Calcium: 9.2 mg/dL (ref 8.4–10.5)
Chloride: 108 mEq/L (ref 96–112)
Creatinine, Ser: 1.16 mg/dL (ref 0.50–1.35)
GFR, EST AFRICAN AMERICAN: 77 mL/min — AB (ref >90)
GFR, EST NON AFRICAN AMERICAN: 67 mL/min — AB (ref >90)
Glucose, Bld: 115 mg/dL — ABNORMAL HIGH (ref 70–99)
POTASSIUM: 4.1 meq/L (ref 3.7–5.3)
Sodium: 141 mEq/L (ref 137–147)

## 2013-02-27 MED ORDER — POTASSIUM CHLORIDE 20 MEQ/15ML (10%) PO LIQD
40.0000 meq | Freq: Every day | ORAL | Status: DC
  Administered 2013-02-28 – 2013-03-01 (×2): 40 meq via ORAL
  Filled 2013-02-27 (×2): qty 30

## 2013-02-27 MED ORDER — ACETAMINOPHEN 325 MG PO TABS: 650.0000 mg | ORAL_TABLET | Freq: Four times a day (QID) | ORAL | Status: DC | PRN

## 2013-02-27 NOTE — Progress Notes (Signed)
[facility] Progress Note [place] TEAM 1 - Stepdown/ICU TEAM  EMARION BABINEAU [id number] DOB: 09-22-1950 DOA: 02/02/2013 PCP: No primary provider on file.  Admit HPI / Brief Narrative: 63 yo man w/ a history of HTN, CHF (EF 45-50%), CAD, COPD, and NSVT, who presented 12/13 with 4 weeks of generalized weakness.  He was found to have AKI, anemia, and a colon adenocarcinoma.   Summary of hospitalization (for transfer to [facility])  The patient was admitted 12/13 with 4 weeks of generalized weakness, found to be hypotensive, with an AKI (Cr = 7.8) and anemia (Hb = 7.1). Cr improved with IV fluids, though anemia required a total of 4 units of pRBC's. On 12/19, colonoscopy showed an obstructing mass in the transverse colon (EGD the same day showed some antral erosions, which were not actively bleeding). Follow-up CT abd showed some low attenuation areas in the liver concerning for mets. The patient was taken to the OR 12/23 for ex lap partial colectomy with coloscopy, with pathology later confirming adenocarcinoma. 18 lymph nodes were resected, but all were benign. Recovery was complicated by wound dehissance, requiring a return to the OR 12/29. After the procedure, the patient developed atrial tachycardia, so a dilt drip was started (12/29-1/2). On 1/4, the patient had a cardiac arrest, requiring defibrillation, and was subsequently transferred to the ICU. QT was found to be > 700, with mild hypokalemia and hypomagnesemia implicated as the cause. Lidocaine drip was started. By 1/6, QTc had decreased to 499, lidocaine drip was d/c'ed, and patient was transferred out of ICU to stepdown.  [facility] resumed care of the pt on 1/07.  HPI/Subjective: Pt is resting comfortably.  States he feels very weak when attempting to ambulate.  Denies cp, sob, f/c, n/v, or abdom pain.    Assessment/Plan:  Colonic adenocarcinoma  S/p resection - ostomy in place - Gen Surgery following   Cardiac arrest  1/04 Cardiology is following  Acute on chronic combined systolic and diastolic CHF (EF [id number] via TTE 1/04) Well compensated at present  NSVT with Prolonged QT Cardiology managing - was on lidocaine drip earlier   Atrial tachycardia / A fib As per Cardiology  Acute kidney injury Resolved  COPD Well compensated at present   Anemia combination of anemia of chronic disease and blood loss from antral ulcers/colon cancer - has received 5 units of PRBCs   Code Status: FULL Family Communication: no family present at time of exam Disposition Plan: stable for transfer to tele bed - cont to work w/ PT/OT - wound care per Gen Surg - Cardiology following as well  Consultants: Gen Surgery [facility] >> PCCM >> [facility]  Procedures: 12/19 - Colonoscopy showed obstructing mass in transverse colon, EGD showed only some antral erosions. CT abd showed possible liver mets  12/23 - pt to OR for ex lap partial colectomy w/ colostomy, pathology showed adenocarcinoma with 18 benign lymph nodes.  12/29 - back to OR for wound dehissnace. Pt developed paroxysmal atrial tach during procedure, started dilt drip  1/4 - pt had cardiac arrest, requiring defibrillation. QT found to be > 700 msec. K and Mg replaced, lidocaine drip started ETT 12/29>>12/29  Antibiotics: none   DVT prophylaxis: lovenox  Objective: Blood pressure 140/85, pulse 80, temperature 98.5 F (36.9 C), temperature source Oral, resp. rate 16, height 5' 10.5" (1.791 m), weight 126 kg (277 lb 12.5 oz), SpO2 95.00%.  Intake/Output Summary (Last 24 hours) at 02/27/13 1439 Last data filed at 02/27/13 1420  Gross per 24 hour  Intake    600 ml  Output   2075 ml  Net  -1475 ml   Exam: General: No acute respiratory distress Lungs: Clear to auscultation bilaterally without wheezes or crackles Cardiovascular: Regular rate and rhythm without murmur gallop or rub normal S1 and S2 Abdomen: midline wound VAC in place, nondistended,  soft, bowel sounds hypoactive, no rebound, no ascites, no appreciable mass Extremities: No significant cyanosis, clubbing, or edema bilateral lower extremities  Data Reviewed: Basic Metabolic Panel:  Recent Labs Lab 02/24/13 0415 02/24/13 1038 02/25/13 0429 02/26/13 0500 02/27/13 0236  NA 142 142 143 140 141  K 3.4* 4.2 3.5* 3.9 4.1  CL 107 106 107 104 108  CO2 21 24 25 24 24   GLUCOSE 126* 91 94 99 115*  BUN 3* 3* <3* <3* 3*  CREATININE 1.00 0.99 0.99 1.01 1.16  CALCIUM 8.7 8.7 8.9 9.1 9.2  MG 1.7 2.3 2.0  --   --    Liver Function Tests: No results found for this basename: AST, ALT, ALKPHOS, BILITOT, PROT, ALBUMIN,  in the last 168 hours  CBC:  Recent Labs Lab 02/21/13 0455 02/22/13 0529 02/24/13 1038 02/26/13 0500 02/27/13 0236  WBC 4.4 4.4 6.8 5.1 5.4  HGB 8.6* 9.5* 9.7* 8.8* 8.6*  HCT 28.2* 31.0* 31.7* 28.8* 27.9*  MCV 91.9 90.9 89.8 90.3 90.9  PLT 224 252 286 264 254   Cardiac Enzymes:  Recent Labs Lab 02/24/13 0415 02/24/13 1038 02/24/13 1525 02/24/13 1846  TROPONINI <0.30 <0.30 <0.30 <0.30   CBG:  Recent Labs Lab 02/21/13 0356 02/21/13 0840 02/21/13 1125 02/21/13 1756 02/21/13 2012  GLUCAP 74 78 84 81 87    Recent Results (from the past 240 hour(s))  CULTURE, EXPECTORATED SPUTUM-ASSESSMENT     Status: None   Collection Time    02/20/13  6:18 PM      Result Value Range Status   Specimen Description SPUTUM   Final   Special Requests NONE   Final   Sputum evaluation     Final   Value: THIS SPECIMEN IS ACCEPTABLE. RESPIRATORY CULTURE REPORT TO FOLLOW.   Report Status 02/20/2013 FINAL   Final  CULTURE, RESPIRATORY (NON-EXPECTORATED)     Status: None   Collection Time    02/20/13  6:18 PM      Result Value Range Status   Specimen Description SPUTUM   Final   Special Requests NONE   Final   Gram Stain     Final   Value: FEW WBC PRESENT, PREDOMINANTLY PMN     FEW SQUAMOUS EPITHELIAL CELLS PRESENT     FEW GRAM NEGATIVE RODS     FEW GRAM  POSITIVE COCCI IN PAIRS     Performed at [facility]   Culture     Final   Value: NORMAL OROPHARYNGEAL FLORA     Performed at [facility]   Report Status 02/23/2013 FINAL   Final  MRSA PCR SCREENING     Status: None   Collection Time    02/24/13  4:34 AM      Result Value Range Status   MRSA by PCR NEGATIVE  NEGATIVE Final   Comment:            The GeneXpert MRSA Assay (FDA     approved for NASAL specimens     only), is one component of a     comprehensive MRSA colonization     surveillance program. It is not  intended to diagnose MRSA     infection nor to guide or     monitor treatment for     MRSA infections.     Studies:  Recent x-ray studies have been reviewed in detail by the Attending Physician  Scheduled Meds:  Scheduled Meds: . aspirin  81 mg Oral Daily  . carvedilol  6.25 mg Oral BID WC  . enoxaparin (LOVENOX) injection  60 mg Subcutaneous Q24H  . ferrous sulfate  325 mg Oral Q breakfast  . pantoprazole  40 mg Oral QHS  . potassium chloride  40 mEq Oral BID  . spironolactone  25 mg Oral Daily    Time spent on care of this patient: 35 mins   Gilbert  [phone] Pager - Text Page per Shea Evans as per below:  On-Call/Text Page:      [email or web address]      password [email or web address]  If 7PM-7AM, please contact night-coverage [email or web address] Password [email or web address] 02/27/2013, 2:39 PM   LOS: 25 days

## 2013-02-27 NOTE — Progress Notes (Signed)
Phoned [facility] At 09:45 to notify them Wound RN would come at 10:00 . Texted at 10:30 . Melody (wound RN) closed wound vac at 10:45 as she had a further appointment.

## 2013-02-27 NOTE — Consult Note (Signed)
WOC ostomy follow up Stoma type/location: RUQ, end colostomy Stomal assessment/size: 1 5/8" x 1 3/4" oval shaped, with some mucosal sloughing at 3 o'clock. The stoma is only slightly budded from the skin and the patient has a fairly protuberant abdomen  Peristomal assessment: intact Treatment options for stomal/peristomal skin: 2" skin barrier ring used to aid in seal with minimally budded stoma Output pasty, liquid green stool Ostomy pouching: 2pc. 2 1/4 used today, however the shape of the stoma I did have to cut the wafer right to the very last cutting guide.  We may have to change back to a larger wafer if leakage is an issue with the smaller wafer.  Education provided: provided wife with ostomy care booklet and [employer], marked items for pt/wife in the catalog and suggested for wife to contact them to set up order plans for when he is discharged. Demonstrated pouch change to the wife and patient today. Pt was able to verbalize back to me some of the previous teaching regarding stoma care and pouch emptying.  Wife and pt would benefit from [facility] at the time of discharge.  Supplies left in the pts room for use.   Enrolled patient in [place] Start Discharge program: Yes  WOC wound follow up Waited for quite a while for surgery team to evaluate wound, WOC replaced dressing due to additional appts and will report off to [facility] on this wound.  Wound type: midline abdominal wound Measurement: 16cm x 3cmx 0.5cm  Wound bed:100% beefy, red granulation tissue, oozing with dressing change Drainage (amount, consistency, odor) minimal in canister Periwound:intact Dressing procedure/placement/frequency:1pc black granufoam cut to fit and placed in the wound, sealed with drape at 150mmHG. Pt tolerated well. Pt premedicated for dressing change per bedside nursing staff.   Wound is almost surface with the skin, may not need VAC at the time of discharge and could be managed with hydrogel daily  dressing if [facility] agrees.   WOC will follow along with you for ostomy care and wound assessments Larry [name] Larry [name], Larry [name]

## 2013-02-27 NOTE — Progress Notes (Signed)
Patient Name: Larry [name] Date of Encounter: 02/27/2013  Principal Problem:   HYPERLIPIDEMIA Active Problems:   OBESITY   HYPERTENSION   CAD   CARDIOMYOPATHY, ISCHEMIC   ATRIAL TACHYCARDIA   C O P D   SLEEP APNEA, OBSTRUCTIVE   Acute renal failure   Hypotension   Chronic blood loss anemia   Atrial fibrillation   Colon cancer   Acute on chronic diastolic heart failure   Torsades de pointes    SUBJECTIVE: Ate solid food last pm, did OK. Not using incentive spirometer much. Denies CP/SOB.  OBJECTIVE Filed Vitals:   02/26/13 1800 02/26/13 2027 02/27/13 0000 02/27/13 0433  BP: 100/65 126/84 135/84 136/87  Pulse: 92 98 95 92  Temp:  99.2 F (37.3 C) 99 F (37.2 C) 98 F (36.7 C)  TempSrc:  Oral Oral Oral  Resp: 23 16 16 15   Height: 5' 10.5" (1.791 m)     Weight: 276 lb 8 oz (125.42 kg)   277 lb 12.5 oz (126 kg)  SpO2: 100% 98% 94% 95%    Intake/Output Summary (Last 24 hours) at 02/27/13 0816 Last data filed at 02/27/13 0630  Gross per 24 hour  Intake    640 ml  Output   1750 ml  Net  -1110 ml   Filed Weights   02/24/13 0435 02/26/13 1800 02/27/13 0433  Weight: 298 lb 1 oz (135.2 kg) 276 lb 8 oz (125.42 kg) 277 lb 12.5 oz (126 kg)    PHYSICAL EXAM General: Well developed, well nourished, male in no acute distress. Head: Normocephalic, atraumatic.  Neck: Supple without bruits, JVD at 8 cm. Lungs:  Resp regular and unlabored, rales left, slight wheeze right. Heart: RRR, S1, S2, no S3, S4, or murmur; no rub. Abdomen: Firm, tender, BS + x 4.  Extremities: No clubbing, cyanosis, no edema.  Neuro: Alert and oriented X 3. Moves all extremities spontaneously. Psych: Normal affect.  LABS: CBC:  Recent Labs  02/26/13 0500 02/27/13 0236  WBC 5.1 5.4  HGB 8.8* 8.6*  HCT 28.8* 27.9*  MCV 90.3 90.9  PLT 264 160   Basic Metabolic Panel:  Recent Labs  02/24/13 1038 02/25/13 0429 02/26/13 0500 02/27/13 0236  NA 142 143 140 141  K 4.2 3.5*  3.9 4.1  CL 106 107 104 108  CO2 24 25 24 24   GLUCOSE 91 94 99 115*  BUN 3* <3* <3* 3*  CREATININE 0.99 0.99 1.01 1.16  CALCIUM 8.7 8.9 9.1 9.2  MG 2.3 2.0  --   --    Cardiac Enzymes:  Recent Labs  02/24/13 1038 02/24/13 1525 02/24/13 1846  TROPONINI <0.30 <0.30 <0.30   TELE:   SR, ST at times (with activity)     ECG: 02/26/2013 SR Vent. rate 91 BPM PR interval 160 ms QRS duration 102 ms QT/QTc 406/499 ms P-R-T axes 50 76 190  Current Medications:  . aspirin  81 mg Oral Daily  . carvedilol  6.25 mg Oral BID WC  . enoxaparin (LOVENOX) injection  60 mg Subcutaneous Q24H  . ferrous sulfate  325 mg Oral Q breakfast  . pantoprazole  40 mg Oral QHS  . potassium chloride  40 mEq Oral BID  . spironolactone  25 mg Oral Daily   . sodium chloride Stopped (02/26/13 1000)  . lactated ringers 100 mL/hr at 02/16/13 0108  . lactated ringers 20 mL/hr at 02/25/13 0700    ASSESSMENT AND PLAN: 63 yo male with hx  CAD/CHF was admitted 12/13 with ARF, hypotension, anemia. Colon mass -> cards consult pre-op, surgery 12/23, wound probs w/surg 12/29, Torsades 01/04 req shock x 1 -> afib, K+ 3.5, Mg 1.7. Torsades again early 01/05, shock into SR. SR since then. Echo 01/04, EF 40-45%.    Torsades de pointes - in the setting of long QT 032 ms, metabolic derangements. No indication of ischemia. Had been off meds due to surgery. Coreg, spironolactone started 01/06. Continue to follow labs, recheck Mg in am. Would keep 2.0. May need to decrease K+ with spiro on board.     Atrial fibrillation - no recurrence, on BB. MD advise if pt should be fully anticoagulated once cleared for this by surgery.    Acute on chronic diastolic heart failure - Volume status OK now, continue to follow I/O, daily weights.    Hypotension - had yesterday after getting initial doses on Coreg/spiro at the same time. Improved with time. Parameters added, follow.   Otherwise, per primary MD/[facility] Principal Problem:    HYPERLIPIDEMIA Active Problems:   OBESITY   HYPERTENSION   CAD   CARDIOMYOPATHY, ISCHEMIC   ATRIAL TACHYCARDIA   C O P D   SLEEP APNEA, OBSTRUCTIVE   Acute renal failure   Hypotension   Chronic blood loss anemia   Colon cancer     Signed, Rosaria Ferries , PA-C 8:16 AM 02/27/2013 Patient seen.  Telemetry reviewed.  Maintaining NSR on BB. No chest pain or dyspnea. BP stable today. Would continue present meds. No further atrial fib and with recent surgery and history of chronic blood loss anemia would not restart anticoagulation at this time.

## 2013-02-27 NOTE — Progress Notes (Signed)
9 Days Post-Op  Subjective: PT doing well on a regular diet. Cardiology issues noted  Objective: Vital signs in last 24 hours: Temp:  [98 F (36.7 C)-99.2 F (37.3 C)] 98 F (36.7 C) (01/07 0433) Pulse Rate:  [89-98] 92 (01/07 0433) Resp:  [14-23] 15 (01/07 0433) BP: (76-163)/(41-106) 136/87 mmHg (01/07 0433) SpO2:  [94 %-100 %] 95 % (01/07 0433) Weight:  [276 lb 8 oz (125.42 kg)-277 lb 12.5 oz (126 kg)] 277 lb 12.5 oz (126 kg) (01/07 0433) Last BM Date: 02/26/13  Intake/Output from previous day: 01/06 0701 - 01/07 0700 In: 680 [P.O.:600; I.V.:80] Out: 1850 [Urine:1350; Stool:500] Intake/Output this shift:    General appearance: alert and cooperative GI: s/nt/nd, ostomy pink/patent, Vac sponge in place  Lab Results:   Recent Labs  02/26/13 0500 02/27/13 0236  WBC 5.1 5.4  HGB 8.8* 8.6*  HCT 28.8* 27.9*  PLT 264 254   BMET  Recent Labs  02/26/13 0500 02/27/13 0236  NA 140 141  K 3.9 4.1  CL 104 108  CO2 24 24  GLUCOSE 99 115*  BUN <3* 3*  CREATININE 1.01 1.16  CALCIUM 9.1 9.2   PT/INR No results found for this basename: LABPROT, INR,  in the last 72 hours ABG No results found for this basename: PHART, PCO2, PO2, HCO3,  in the last 72 hours  Studies/Results: No results found.  Anti-infectives: Anti-infectives   Start     Dose/Rate Route Frequency Ordered Stop   02/18/13 1057  ANCEF 1 gram in 0.9% normal saline 500 mL  Status:  Discontinued       As needed 02/18/13 1057 02/18/13 1152   02/12/13 2330  cefoTEtan (CEFOTAN) 1 g in dextrose 5 % 50 mL IVPB     1 g 100 mL/hr over 30 Minutes Intravenous  Once 02/12/13 1717 02/13/13 0024   02/12/13 0600  [MAR Hold]  cefoTEtan (CEFOTAN) 2 g in dextrose 5 % 50 mL IVPB     (On MAR Hold since 02/12/13 1206)   2 g 100 mL/hr over 30 Minutes Intravenous On call to O.R. 02/11/13 1706 02/12/13 1216      Assessment/Plan: s/p Procedure(s): EXPLORATORY LAPAROTOMY/Closure of Wound (N/A)  Con't reg  diet Mobilize Con't vac sponge  LOS: 25 days    Rosario Jacks., Anne Hahn 02/27/2013

## 2013-02-28 LAB — CBC
HCT: 28.6 % — ABNORMAL LOW (ref 39.0–52.0)
Hemoglobin: 8.8 g/dL — ABNORMAL LOW (ref 13.0–17.0)
MCH: 27.9 pg (ref 26.0–34.0)
MCHC: 30.8 g/dL (ref 30.0–36.0)
MCV: 90.8 fL (ref 78.0–100.0)
PLATELETS: 247 10*3/uL (ref 150–400)
RBC: 3.15 MIL/uL — ABNORMAL LOW (ref 4.22–5.81)
RDW: 18.1 % — AB (ref 11.5–15.5)
WBC: 5.4 10*3/uL (ref 4.0–10.5)

## 2013-02-28 NOTE — Progress Notes (Signed)
10 Days Post-Op  Subjective: Feeling better.  Not much appetite, but no nausea Ostomy functioning quite well  Objective: Vital signs in last 24 hours: Temp:  [98.1 F (36.7 C)-98.5 F (36.9 C)] 98.1 F (36.7 C) (01/08 0450) Pulse Rate:  [84-89] 84 (01/08 0450) Resp:  [18-20] 20 (01/08 0450) BP: (138-141)/(82-85) 141/82 mmHg (01/08 0450) SpO2:  [94 %-97 %] 97 % (01/08 0450) Last BM Date: 02/27/13  Intake/Output from previous day: 01/07 0701 - 01/08 0700 In: 240 [P.O.:240] Out: 900 [Urine:650; Stool:250] Intake/Output this shift: Total I/O In: -  Out: 425 [Urine:425]  General appearance: alert, cooperative and no distress Resp: clear to auscultation bilaterally GI: soft, non-tender; bowel sounds normal; no masses,  no organomegaly Ostomy functioning well; incision covered with VAC dressing  Lab Results:   Recent Labs  02/27/13 0236 02/28/13 0510  WBC 5.4 5.4  HGB 8.6* 8.8*  HCT 27.9* 28.6*  PLT 254 247   BMET  Recent Labs  02/26/13 0500 02/27/13 0236  NA 140 141  K 3.9 4.1  CL 104 108  CO2 24 24  GLUCOSE 99 115*  BUN <3* 3*  CREATININE 1.01 1.16  CALCIUM 9.1 9.2   PT/INR No results found for this basename: LABPROT, INR,  in the last 72 hours ABG No results found for this basename: PHART, PCO2, PO2, HCO3,  in the last 72 hours  Studies/Results: No results found.  Anti-infectives: Anti-infectives   Start     Dose/Rate Route Frequency Ordered Stop   02/18/13 1057  ANCEF 1 gram in 0.9% normal saline 500 mL  Status:  Discontinued       As needed 02/18/13 1057 02/18/13 1152   02/12/13 2330  cefoTEtan (CEFOTAN) 1 g in dextrose 5 % 50 mL IVPB     1 g 100 mL/hr over 30 Minutes Intravenous  Once 02/12/13 1717 02/13/13 0024   02/12/13 0600  [MAR Hold]  cefoTEtan (CEFOTAN) 2 g in dextrose 5 % 50 mL IVPB     (On MAR Hold since 02/12/13 1206)   2 g 100 mL/hr over 30 Minutes Intravenous On call to O.R. 02/11/13 1706 02/12/13 1216       Assessment/Plan: s/p Procedure(s): EXPLORATORY LAPAROTOMY/Closure of Wound (N/A) Doing well from surgical standpoint.  Appetite slow to return. Change VAC MWF while in hospital, but then switch to wet to dry dressings with home health at discharge  LOS: 26 days    Brionna Romanek K. 02/28/2013

## 2013-02-28 NOTE — Progress Notes (Signed)
Read, reviewed, edited and agree with student's findings and recommendations.  Darling Cieslewicz B. Jordon Kristiansen, PT, DPT #[phone]  

## 2013-02-28 NOTE — Progress Notes (Signed)
Rehab Admissions Coordinator Note:  Patient was screened by Retta Diones for appropriateness for an Inpatient Acute Rehab Consult.  At this time, we are recommending Inpatient Rehab consult.  Retta Diones 02/28/2013, 6:06 PM  I can be reached at [phone].

## 2013-02-28 NOTE — Progress Notes (Signed)
[facility] Progress Note [place] TEAM 1 - Stepdown/ICU TEAM  Larry [name] WNU:[id number] DOB: [birth year] DOA: 02/02/2013 PCP: No primary provider on file.  Admit HPI / Brief Narrative: 63 yo man w/ a history of HTN, CHF (EF 45-50%), CAD, COPD, and NSVT, who presented 12/13 with 4 weeks of generalized weakness.  He was found to have AKI, anemia, and a colon adenocarcinoma.   Summary of hospitalization  The patient was admitted 12/13 with 4 weeks of generalized weakness, found to be hypotensive, with an AKI (Cr = 7.8) and anemia (Hb = 7.1). Cr improved with IV fluids, though anemia required a total of 4 units of pRBC's. On 12/19, colonoscopy showed an obstructing mass in the transverse colon (EGD the same day showed some antral erosions, which were not actively bleeding). Follow-up CT abd showed some low attenuation areas in the liver concerning for mets. The patient was taken to the OR 12/23 for ex lap partial colectomy with coloscopy, with pathology later confirming adenocarcinoma. 18 lymph nodes were resected, but all were benign. Recovery was complicated by wound dehissance, requiring a return to the OR 12/29. After the procedure, the patient developed atrial tachycardia, so a dilt drip was started (12/29-1/2). On 1/4, the patient had a cardiac arrest, requiring defibrillation, and was subsequently transferred to the ICU. QT was found to be > 700, with mild hypokalemia and hypomagnesemia implicated as the cause. Lidocaine drip was started. By 1/6, QTc had decreased to 499, lidocaine drip was d/c'ed, and patient was transferred out of ICU to stepdown.  [facility] resumed care of the pt on 1/07.  HPI/Subjective: Pt is resting comfortably.  Reports that his appetite is quite poor today.  Denies n/v, abdom pain, sob, or cp.  Reports good consistent outpt via ostomy.    Assessment/Plan:  Colonic adenocarcinoma  S/p resection - ostomy in place - Gen Surgery following   Cardiac arrest 1/04  (Torsades) in the setting of long QT (034 ms) + metabolic derangements - Cardiology is following - keep Mg 2.0 or >  Acute on chronic combined systolic and diastolic CHF (EF 74-25% via TTE 1/04) Well compensated at present  NSVT with Prolonged QT Cardiology managing - was on lidocaine drip earlier in hospital stay   Atrial tachycardia / A fib As per Cardiology - has not recurred of late   Acute kidney injury Resolved  COPD Well compensated at present   Anemia combination of anemia of chronic disease and blood loss from antral ulcers/colon cancer - has received 5 units of PRBCs   Code Status: FULL Family Communication: spoke w/ pt and wife at bedside Disposition Plan: tele bed - cont to work w/ PT/OT - wound care per Gen Surg - Cardiology following as well - possible d/c next 24-48hrs  Consultants: Gen Surgery [facility] >> PCCM >> [facility] Cardiology  Procedures: 12/19 - Colonoscopy showed obstructing mass in transverse colon, EGD showed only some antral erosions. CT abd showed possible liver mets  12/23 - pt to OR for ex lap partial colectomy w/ colostomy, pathology showed adenocarcinoma with 18 benign lymph nodes.  12/29 - back to OR for wound dehissnace. Pt developed paroxysmal atrial tach during procedure, started dilt drip  1/4 - pt had cardiac arrest, requiring defibrillation. QT found to be > 700 msec. K and Mg replaced, lidocaine drip started ETT 12/29>>12/29  Antibiotics: none   DVT prophylaxis: lovenox  Objective: Blood pressure 141/82, pulse 84, temperature 98.1 F (36.7 C), temperature source Oral, resp. rate  20, height 5' 10.5" (1.791 m), weight 126 kg (277 lb 12.5 oz), SpO2 97.00%.  Intake/Output Summary (Last 24 hours) at 02/28/13 1237 Last data filed at 02/28/13 0848  Gross per 24 hour  Intake    240 ml  Output   1075 ml  Net   -835 ml   Exam: General: No acute respiratory distress Lungs: Clear to auscultation bilaterally without wheezes or  crackles Cardiovascular: Regular rate and rhythm without murmur gallop or rub normal S1 and S2 Abdomen: midline wound VAC in place, nondistended, soft, bowel sounds hypoactive, no rebound, no ascites, no appreciable mass Extremities: No significant cyanosis, clubbing, edema bilateral lower extremities  Data Reviewed: Basic Metabolic Panel:  Recent Labs Lab 02/24/13 0415 02/24/13 1038 02/25/13 0429 02/26/13 0500 02/27/13 0236  NA 142 142 143 140 141  K 3.4* 4.2 3.5* 3.9 4.1  CL 107 106 107 104 108  CO2 21 24 25 24 24   GLUCOSE 126* 91 94 99 115*  BUN 3* 3* <3* <3* 3*  CREATININE 1.00 0.99 0.99 1.01 1.16  CALCIUM 8.7 8.7 8.9 9.1 9.2  MG 1.7 2.3 2.0  --   --    Liver Function Tests: No results found for this basename: AST, ALT, ALKPHOS, BILITOT, PROT, ALBUMIN,  in the last 168 hours  CBC:  Recent Labs Lab 02/22/13 0529 02/24/13 1038 02/26/13 0500 02/27/13 0236 02/28/13 0510  WBC 4.4 6.8 5.1 5.4 5.4  HGB 9.5* 9.7* 8.8* 8.6* 8.8*  HCT 31.0* 31.7* 28.8* 27.9* 28.6*  MCV 90.9 89.8 90.3 90.9 90.8  PLT 252 286 264 254 247   Cardiac Enzymes:  Recent Labs Lab 02/24/13 0415 02/24/13 1038 02/24/13 1525 02/24/13 1846  TROPONINI <0.30 <0.30 <0.30 <0.30   CBG:  Recent Labs Lab 02/21/13 1756 02/21/13 2012  GLUCAP 81 87    Recent Results (from the past 240 hour(s))  CULTURE, EXPECTORATED SPUTUM-ASSESSMENT     Status: None   Collection Time    02/20/13  6:18 PM      Result Value Range Status   Specimen Description SPUTUM   Final   Special Requests NONE   Final   Sputum evaluation     Final   Value: THIS SPECIMEN IS ACCEPTABLE. RESPIRATORY CULTURE REPORT TO FOLLOW.   Report Status 02/20/2013 FINAL   Final  CULTURE, RESPIRATORY (NON-EXPECTORATED)     Status: None   Collection Time    02/20/13  6:18 PM      Result Value Range Status   Specimen Description SPUTUM   Final   Special Requests NONE   Final   Gram Stain     Final   Value: FEW WBC PRESENT,  PREDOMINANTLY PMN     FEW SQUAMOUS EPITHELIAL CELLS PRESENT     FEW GRAM NEGATIVE RODS     FEW GRAM POSITIVE COCCI IN PAIRS     Performed at [facility]   Culture     Final   Value: NORMAL OROPHARYNGEAL FLORA     Performed at [facility]   Report Status 02/23/2013 FINAL   Final  MRSA PCR SCREENING     Status: None   Collection Time    02/24/13  4:34 AM      Result Value Range Status   MRSA by PCR NEGATIVE  NEGATIVE Final   Comment:            The GeneXpert MRSA Assay (FDA     approved for NASAL specimens     only),  is one component of a     comprehensive MRSA colonization     surveillance program. It is not     intended to diagnose MRSA     infection nor to guide or     monitor treatment for     MRSA infections.     Studies:  Recent x-ray studies have been reviewed in detail by the Attending Physician  Scheduled Meds:  Scheduled Meds: . aspirin  81 mg Oral Daily  . carvedilol  6.25 mg Oral BID WC  . enoxaparin (LOVENOX) injection  60 mg Subcutaneous Q24H  . ferrous sulfate  325 mg Oral Q breakfast  . pantoprazole  40 mg Oral QHS  . potassium chloride  40 mEq Oral Daily  . spironolactone  25 mg Oral Daily    Time spent on care of this patient: 25 mins   [place]  [phone] Pager - Text Page per Shea Evans as per below:  On-Call/Text Page:      [email or web address]      password [email or web address]  If 7PM-7AM, please contact night-coverage [email or web address] Password [email or web address] 02/28/2013, 12:37 PM   LOS: 26 days

## 2013-02-28 NOTE — Progress Notes (Signed)
Physical Therapy Treatment Patient Details Name: Larry [name] MRN: [id number] DOB: [birth year] Today's Date: 02/28/2013 Time: 9371-6967 PT Time Calculation (min): 24 min  PT Assessment / Plan / Recommendation  History of Present Illness Pt is a 63 y.o. male adm from home due to generalized weakness. Patient with colon mass - adenocarcinoma. Patient s/p colectomy and colostomy 12/23. On 12/29, patient s/p abd wound dehissance closure with VAC. Patient had cardiac arrest 02/23/13 torsades de pointes was rhythm   PT Comments   Patient goals reassessed and set based on current function.  Patient requires A +2 for all mobility.  He requires A to stand from the bed, even with the bed being a higher surface than he would use at home if he were in a chair.  Patient will require more strength if he is to be safe with d/c to home as his wife will be his only caregiver.  He states that he is unsure that she will be able to help him with the necessary physical assistance that he will likely need.  He rapidly fatigues with ambulation of a short distance, requiring to sit down in recliner once he is back in the room.  Patient would be an excellent candidate for CIR to improve his strength and functional abilities to increase his safety and decrease the burden of care for his wife once he is d/c'ed to home.   Follow Up Recommendations  CIR     Does the patient have the potential to tolerate intense rehabilitation   Yes     Equipment Recommendations  Rolling walker with 5" wheels    Recommendations for Other Services Rehab consult  Frequency Min 3X/week   Progress towards PT Goals Progress towards PT goals: Progressing toward goals  Plan Discharge plan needs to be updated    Precautions / Restrictions Precautions Precautions: Fall Precaution Comments: colostomy, wound VAC, abdominal binder.  No binder in room, RN cleared to continue without, still assessing with team to confirm if he does/doesn't  need to wear this. Restrictions Weight Bearing Restrictions: No   Pertinent Vitals/Pain On room air throughout. 98% spo2 at start, 98 bpm. 96% spo2 at end, 94 bpm.    Mobility  Bed Mobility Overal bed mobility: Needs Assistance Bed Mobility: Supine to Sit Supine to sit: Max assist General bed mobility comments: Requires UE assist to help to pull him up into sitting.  He is able to start the motion, but is unable to achieve the whole task. Transfers Overall transfer level: Needs assistance Equipment used: Rolling walker (2 wheeled) Transfers: Sit to/from Stand Sit to Stand: +2 for physical assistance, Min assistance General transfer comment: He likes to have +2 people, so 1 person can be on both side for support as he is unsteady on feet.  He does 90% of the work, requiring A at his hips at the beginning of the stand.  He uses a rocking motion and momentum to achieve standing Ambulation/Gait Ambulation/Gait assistance: +2 safety/equipment, Min assistance Ambulation Distance (Feet): 150 Feet. A required at hip for stability.  Safety for carrying wound vac, and bringing a chair behind him as he rapidly fatigues. Assistive device: Rolling walker (2 wheeled) Gait Pattern/deviations: Step-to pattern;Decreased stride length      PT Goals (current goals can now be found in the care plan section) Acute Rehab PT Goals Patient Stated Goal: to be able to go home PT Goal Formulation: With patient Time For Goal Achievement: 03/14/13 Potential to Achieve Goals: Good  Visit Information  Last PT Received On: 02/28/13 Assistance Needed: +2 (chair behind during ambulation, and during standing) History of Present Illness: Pt went back to OR 12/29 for abd wound dehiscence closure.Pt is a 63 y.o. male adm from home due to generalized weakness.  Patient with colon mass - adenocarcinoma.  Patient s/p colectomy and colostomy 12/23.  On 12/29, patient s/p abd wound dehissance closure with VAC.     Subjective Data  Patient Stated Goal: to be able to go home   Cognition  Cognition Arousal/Alertness: Awake/alert Behavior During Therapy: WFL for tasks assessed/performed Overall Cognitive Status: Within Functional Limits for tasks assessed       End of Session PT - End of Session Equipment Utilized During Treatment: Gait belt Activity Tolerance: Patient limited by fatigue Patient left: in chair;with call bell/phone within reach Nurse Communication: Mobility status   GP    Cordelia Poche, SPT Pager:  [phone].  Cordelia Poche 02/28/2013, 5:22 PM

## 2013-02-28 NOTE — Progress Notes (Signed)
Occupational Therapy Evaluation Patient Details Name: Larry [name] MRN: [id number] DOB: [birth year] Today's Date: 02/28/2013 Time: 6144-3154 OT Time Calculation (min): 18 min  OT Assessment / Plan / Recommendation History of present illness Pt went back to O for abd wound dehiscence closure.Pt is a 63 y.o. male adm from home due to generalized weakness.  Patient with colon mass - adenocarcinoma.  Patient s/p colectomy and colostomy 12/23.  On 12/29, patient s/p abd wound dehissance closure with VAC. MI 1/4 with defibrillation.    Clinical Impression   PTA, pt was independent with ADL and mobility and lived with wife. Pt has had prolonged hospitalization and is extremely deconditioned. Feel pt is excellent rehab candidate and would benefit from extensive rehab to return home with wife, who can provide 24/7 S. Pt is very motivated to return to PLOF. Pt discussed feelings of being upset about colostomy. Acknowledged pt's feelings and asked he would like to talk with someone, but pt declined. Pt will benefit from skilled OT services to facilitate D/C to next venue due to below deficits. Will begin theraband ex and training with AE for ADL.    OT Assessment  Patient needs continued OT Services    Follow Up Recommendations  CIR    Barriers to Discharge      Equipment Recommendations  3 in 1 bedside comode    Recommendations for Other Services Rehab consult  Frequency  Min 3X/week    Precautions / Restrictions Precautions Precautions: Fall Precaution Comments: colostomy, wound VAC, abdominal binder( order for abdominal binder, but no binder in room) attempted to contact MD regarding need. Restrictions Weight Bearing Restrictions: No   Pertinent Vitals/Pain no apparent distress     ADL  Grooming: Set up Where Assessed - Grooming: Supported sitting Upper Body Bathing: Minimal assistance Where Assessed - Upper Body Bathing: Supported sitting Lower Body Bathing: Maximal  assistance Where Assessed - Lower Body Bathing: Supported sit to stand Upper Body Dressing: Minimal assistance Where Assessed - Upper Body Dressing: Supported sitting Lower Body Dressing: Maximal assistance Where Assessed - Lower Body Dressing: Supported sit to Lobbyist: Moderate assistance Toileting - Clothing Manipulation and Hygiene: Moderate assistance Equipment Used: Gait belt;Rolling walker Transfers/Ambulation Related to ADLs: Pt using momentum to complete sit - stand. Pt required 5 trials before being able to stand ADL Comments: easily fatigues. Max A with LB ADL. Will benefit from AE    OT Diagnosis: Generalized weakness;Acute pain  OT Problem List: Decreased strength;Decreased activity tolerance;Decreased knowledge of use of DME or AE;Decreased knowledge of precautions;Cardiopulmonary status limiting activity;Obesity;Pain OT Treatment Interventions: Self-care/ADL training;Therapeutic exercise;Energy conservation;DME and/or AE instruction;Therapeutic activities;Patient/family education   OT Goals(Current goals can be found in the care plan section) Acute Rehab OT Goals Patient Stated Goal: to be able to go home OT Goal Formulation: With patient Time For Goal Achievement: 03/14/13 Potential to Achieve Goals: Good  Visit Information  Last OT Received On: 02/28/13 Assistance Needed: +2 (Chair behind during ambulation, standing) History of Present Illness: Pt went back to O for abd wound dehiscence closure.Pt is a 63 y.o. male adm from home due to generalized weakness.  Patient with colon mass - adenocarcinoma.  Patient s/p colectomy and colostomy 12/23.  On 12/29, patient s/p abd wound dehissance closure with VAC.       Prior Functioning     Home Living Family/patient expects to be discharged to:: Inpatient rehab Prior Function Level of Independence: Independent Communication Communication: No difficulties Dominant Hand: Right  Vision/Perception   [facility]   Cognition  Cognition Arousal/Alertness: Awake/alert Behavior During Therapy: WFL for tasks assessed/performed Overall Cognitive Status: Within Functional Limits for tasks assessed    Extremity/Trunk Assessment Upper Extremity Assessment Upper Extremity Assessment: Generalized weakness Lower Extremity Assessment Lower Extremity Assessment: Generalized weakness Cervical / Trunk Assessment Cervical / Trunk Assessment: Normal     Mobility Bed Mobility Overal bed mobility: Needs Assistance Bed Mobility: Supine to Sit Supine to sit: Max assist  Transfers Overall transfer level: Needs assistance Equipment used: Rolling walker (2 wheeled) Transfers: Sit to/from Stand Sit to Stand: +2 safety/equipment;Min assist  General transfer comment: Very difficulty for pt to come to standing position. Using momentum. Required 5 trials before he could stand with difficulty    Exercise Other Exercises Other Exercises: Encouraged [facility] and [facility]   Balance Balance Overall balance assessment: Needs assistance Sitting-balance support: Feet supported;Bilateral upper extremity supported Sitting balance-Leahy Scale: Good   End of Session OT - End of Session Equipment Utilized During Treatment: Gait belt;Rolling walker Activity Tolerance: Patient tolerated treatment well Patient left: in chair;with call bell/phone within reach Nurse Communication: Mobility status  GO     Gaddiel Cullens,HILLARY 02/28/2013, 5:22 PM [facility], OTR/L  [phone] 02/28/2013

## 2013-02-28 NOTE — Progress Notes (Signed)
Patient Name: Larry [name] Date of Encounter: 02/28/2013     Principal Problem:   HYPERLIPIDEMIA Active Problems:   OBESITY   HYPERTENSION   CAD   CARDIOMYOPATHY, ISCHEMIC   ATRIAL TACHYCARDIA   C O P D   SLEEP APNEA, OBSTRUCTIVE   Acute renal failure   Hypotension   Chronic blood loss anemia   Atrial fibrillation   Colon cancer   Acute on chronic diastolic heart failure   Torsades de pointes    SUBJECTIVE  The patient denies any chest pain or shortness of breath.  Rhythm remains regular normal sinus rhythm.  He is complaining of poor appetite today.  CURRENT MEDS . aspirin  81 mg Oral Daily  . carvedilol  6.25 mg Oral BID WC  . enoxaparin (LOVENOX) injection  60 mg Subcutaneous Q24H  . ferrous sulfate  325 mg Oral Q breakfast  . pantoprazole  40 mg Oral QHS  . potassium chloride  40 mEq Oral Daily  . spironolactone  25 mg Oral Daily    OBJECTIVE  Filed Vitals:   02/27/13 1635 02/27/13 1900 02/27/13 2046 02/28/13 0450  BP:  138/85  141/82  Pulse:  89 84 84  Temp: 98.5 F (36.9 C) 98.5 F (36.9 C) 98.5 F (36.9 C) 98.1 F (36.7 C)  TempSrc:  Oral Oral Oral  Resp:  18 20 20   Height:      Weight:      SpO2:  96% 94% 97%    Intake/Output Summary (Last 24 hours) at 02/28/13 0933 Last data filed at 02/28/13 0848  Gross per 24 hour  Intake    240 ml  Output   1225 ml  Net   -985 ml   Filed Weights   02/24/13 0435 02/26/13 1800 02/27/13 0433  Weight: 298 lb 1 oz (135.2 kg) 276 lb 8 oz (125.42 kg) 277 lb 12.5 oz (126 kg)    PHYSICAL EXAM  General: Pleasant, NAD. Neuro: Alert and oriented X 3. Moves all extremities spontaneously. Psych: Normal affect. HEENT:  Normal  Neck: Supple without bruits or JVD. Lungs:  Resp regular and unlabored, CTA. Heart: RRR no s3, s4, or murmurs. Abdomen: Soft, non-tender, dressing intact. Extremities: No clubbing, cyanosis or edema. DP/PT/Radials 2+ and equal bilaterally.  Accessory Clinical  Findings  CBC  Recent Labs  02/27/13 0236 02/28/13 0510  WBC 5.4 5.4  HGB 8.6* 8.8*  HCT 27.9* 28.6*  MCV 90.9 90.8  PLT 254 [id number]   Basic Metabolic Panel  Recent Labs  02/26/13 0500 02/27/13 0236  NA 140 141  K 3.9 4.1  CL 104 108  CO2 24 24  GLUCOSE 99 115*  BUN <3* 3*  CREATININE 1.01 1.16  CALCIUM 9.1 9.2   Liver Function Tests No results found for this basename: AST, ALT, ALKPHOS, BILITOT, PROT, ALBUMIN,  in the last 72 hours No results found for this basename: LIPASE, AMYLASE,  in the last 72 hours Cardiac Enzymes No results found for this basename: CKTOTAL, CKMB, CKMBINDEX, TROPONINI,  in the last 72 hours BNP No components found with this basename: POCBNP,  D-Dimer No results found for this basename: DDIMER,  in the last 72 hours Hemoglobin A1C No results found for this basename: HGBA1C,  in the last 72 hours Fasting Lipid Panel No results found for this basename: CHOL, HDL, LDLCALC, TRIG, CHOLHDL, LDLDIRECT,  in the last 72 hours Thyroid Function Tests No results found for this basename: TSH, T4TOTAL, FREET3, T3FREE, THYROIDAB,  in  the last 72 hours  TELE Normal sinus rhythm  ECG    Radiology/Studies  Ct Chest W Contrast  02/08/2013   CLINICAL DATA:  Mass in transverse colon  EXAM: CT CHEST, ABDOMEN, AND PELVIS WITH CONTRAST  TECHNIQUE: Multidetector CT imaging of the chest, abdomen and pelvis was performed following the standard protocol during bolus administration of intravenous contrast.  CONTRAST:  OMNIPAQUE IOHEXOL 300 MG/ML  SOLN  COMPARISON:  100 cc of Omni 300  FINDINGS: CT CHEST FINDINGS  There is no pleural effusion identified. Subpleural nodule within the anterior right upper lobe measures 5 mm, image 33/series 3. Subpleural scar is identified in the right upper lobe, image 28/series 3.  The trachea appears patent and midline. The heart size is normal. Calcified atherosclerotic disease affects the thoracic aorta as well as the RCA, LAD  and left circumflex coronary artery. No pericardial effusion noted. There is no mediastinal or hilar adenopathy. No axillary or supraclavicular adenopathy identified.  Review of the visualized bony structures is significant for mild multilevel thoracic spondylosis.  CT ABDOMEN AND PELVIS FINDINGS  There are several small, indeterminate low attenuation structures within the liver parenchyma. These are too small to characterize. Index hypodensity within the right hepatic lobe measures 6 mm, image number 58/series 2. Near the dome of liver there is a 5 mm hypodensity, image 51/series 2. Posterior right hepatic lobe hypodensity measures 5 mm, image 62/series 2. The gallbladder appears normal. There is no biliary dilatation. Normal appearance of the pancreas. The spleen is unremarkable.  Normal appearance of the adrenal glands. Left renal cyst identified. Bilateral renal hypodensities are noted likely representing cysts. 3.8 cm diverticula arises from the right posterior bladder wall. Calcifications within the prostate gland are identified.  Calcified atherosclerotic disease affects the abdominal aorta. There is no aneurysm. No retroperitoneal adenopathy identified. No pelvic or inguinal adenopathy noted.  Normal appearance of the stomach. The small bowel loops are within normal limits circumferential mass involving the mid transverse colon is identified, image number 82/series 2. This measures approximately 6 cm in length. There is increased caliber of the proximal large bowel suggesting partial obstruction. No ascites or peritoneal nodularity identified.  Review of the visualized osseous structures is significant for mild lumbar spondylosis. No aggressive lytic or sclerotic bone lesions identified.  IMPRESSION: CT chest:  1. Circumferential mass involving the mid transverse colon is identified compatible with colon carcinoma. 2. Subpleural nodule in the right upper lobe measures 5 mm. 3. Several low-attenuation foci  are identified within the liver parenchyma. These are too small to reliably characterize. In a patient with a new diagnosis of colon cancer I would suggest more definitive characterization with contrast enhanced MRI of the liver.   Electronically Signed   By: Signa Kell M.D.   On: 02/08/2013 14:46   Ct Abdomen Pelvis W Contrast  02/08/2013   CLINICAL DATA:  Mass in transverse colon  EXAM: CT CHEST, ABDOMEN, AND PELVIS WITH CONTRAST  TECHNIQUE: Multidetector CT imaging of the chest, abdomen and pelvis was performed following the standard protocol during bolus administration of intravenous contrast.  CONTRAST:  OMNIPAQUE IOHEXOL 300 MG/ML  SOLN  COMPARISON:  100 cc of Omni 300  FINDINGS: CT CHEST FINDINGS  There is no pleural effusion identified. Subpleural nodule within the anterior right upper lobe measures 5 mm, image 33/series 3. Subpleural scar is identified in the right upper lobe, image 28/series 3.  The trachea appears patent and midline. The heart size is normal. Calcified  atherosclerotic disease affects the thoracic aorta as well as the RCA, LAD and left circumflex coronary artery. No pericardial effusion noted. There is no mediastinal or hilar adenopathy. No axillary or supraclavicular adenopathy identified.  Review of the visualized bony structures is significant for mild multilevel thoracic spondylosis.  CT ABDOMEN AND PELVIS FINDINGS  There are several small, indeterminate low attenuation structures within the liver parenchyma. These are too small to characterize. Index hypodensity within the right hepatic lobe measures 6 mm, image number 58/series 2. Near the dome of liver there is a 5 mm hypodensity, image 51/series 2. Posterior right hepatic lobe hypodensity measures 5 mm, image 62/series 2. The gallbladder appears normal. There is no biliary dilatation. Normal appearance of the pancreas. The spleen is unremarkable.  Normal appearance of the adrenal glands. Left renal cyst identified.  Bilateral renal hypodensities are noted likely representing cysts. 3.8 cm diverticula arises from the right posterior bladder wall. Calcifications within the prostate gland are identified.  Calcified atherosclerotic disease affects the abdominal aorta. There is no aneurysm. No retroperitoneal adenopathy identified. No pelvic or inguinal adenopathy noted.  Normal appearance of the stomach. The small bowel loops are within normal limits circumferential mass involving the mid transverse colon is identified, image number 82/series 2. This measures approximately 6 cm in length. There is increased caliber of the proximal large bowel suggesting partial obstruction. No ascites or peritoneal nodularity identified.  Review of the visualized osseous structures is significant for mild lumbar spondylosis. No aggressive lytic or sclerotic bone lesions identified.  IMPRESSION: CT chest:  1. Circumferential mass involving the mid transverse colon is identified compatible with colon carcinoma. 2. Subpleural nodule in the right upper lobe measures 5 mm. 3. Several low-attenuation foci are identified within the liver parenchyma. These are too small to reliably characterize. In a patient with a new diagnosis of colon cancer I would suggest more definitive characterization with contrast enhanced MRI of the liver.   Electronically Signed   By: Kerby Moors M.D.   On: 02/08/2013 14:46   US Renal  02/02/2013   CLINICAL DATA:  Renal failure  EXAM: RENAL/URINARY TRACT ULTRASOUND COMPLETE  COMPARISON:  None.  FINDINGS: Right Kidney:  Length: 11.6 cm. Echogenic renal parenchyma, suggesting medical renal disease. 1.4 x 1.1 x 1.2 cm upper pole cyst. No hydronephrosis.  Left Kidney:  Length: 11.7 cm. Echogenic renal parenchyma, suggesting medical renal disease. 2.1 x 2.0 x 2.0 cm upper pole cyst. No hydronephrosis.  Bladder:  Decompressed by indwelling Foley catheter.  IMPRESSION: Echogenic renal parenchyma, suggesting medical renal  disease.  Bilateral renal cysts.  No hydronephrosis.   Electronically Signed   By: Julian Hy M.D.   On: 02/02/2013 20:59   Mr Liver W Contrast  02/14/2013   CLINICAL DATA:  Colonic mass, presumably colon cancer, at prior imaging, liver masses on prior CT  EXAM: MRI ABDOMEN WITHOUT CONTRAST  TECHNIQUE: Multiplanar multisequence MR imaging of the abdomen was performed after the administration of intravenous contrast.  CONTRAST:  10 cc Eovist IV contrast  COMPARISON:  CT 02/08/2013  FINDINGS: Small bilateral pleural effusions are identified. Evidence of midline abdominal incision and diverting right lower quadrant colostomy. Motion artifact is present on multiple series. This essentially obscures detail of the previously described sub cm hepatic lesions, although no enhancement is identified at the largest of these masses measuring 6 mm within the right hepatic lobe, for example image 35 series 503 allowing for motion. On T2 weighted images, the previously seen sub cm  hypodense lesions in the right hepatic lobe appear T2 hyperintense. The dome of medial segment left hepatic lobe 3 mm lesion appears T2 hyperintense but is too small for definitive characterization on postcontrast imaging allowing for motion.  Adrenal glands, spleen, pancreas, and gallbladder are normal. No ascites. Bilateral renal cortical cysts are noted.  IMPRESSION: Hepatic T2 hyperintense lesions are identified most compatible with biliary cysts or hamartomas. Because of patient motion, only the largest of these measuring 6 mm in the posterior segment right hepatic lobe can be relatively confidently identified as lacking internal contrast enhancement. The others are too small for postcontrast evaluation due to patient motion. Further workup is as per the overall patient's periodic restaging plan.  Bilateral small pleural effusions.  Interval diverting right lower quadrant colostomy.   Electronically Signed   By: Conchita Paris M.D.   On:  02/14/2013 09:09   Dg Chest Port 1 View  02/19/2013   CLINICAL DATA:  Cough, congestion  EXAM: PORTABLE CHEST - 1 VIEW  COMPARISON:  02/18/2013  FINDINGS: The cardiac silhouette is enlarged. Low lung volumes. The lungs are clear. An NG tube is seen tip projecting in the expected region of the stomach, fundal region. The osseous structures are unremarkable.  IMPRESSION: Low lung volumes, cardiomegaly, no evidence of acute cardiopulmonary disease.   Electronically Signed   By: Margaree Mackintosh M.D.   On: 02/19/2013 08:39   Dg Chest Port 1 View  02/18/2013   CLINICAL DATA:  Postop congestion.  EXAM: PORTABLE CHEST - 1 VIEW  COMPARISON:  02/08/2013 CT chest.  02/03/2012 chest x-ray.  FINDINGS: Cardiomegaly.  Pulmonary vascular congestion most notable centrally.  Question basilar subsegmental atelectasis.  CT detected subpleural nodule right upper lobe not evaluated on present exam.  IMPRESSION: Cardiomegaly.  Pulmonary vascular congestion most notable centrally.  Question basilar subsegmental atelectasis.  CT detected subpleural nodule right upper lobe not evaluated on present exam.   Electronically Signed   By: Chauncey Cruel M.D.   On: 02/18/2013 13:13   Dg Chest Portable 1 View  02/02/2013   CLINICAL DATA:  Weakness, presyncope  EXAM: PORTABLE CHEST - 1 VIEW  COMPARISON:  06/16/2011  FINDINGS: Mildly low lung volumes. There is mild elevation the right hemidiaphragm. Cardiac silhouette is within normal limits. There is no evidence of focal infiltrates, effusions, nor edema. Dextroscoliosis identified within the thoracic spine.  IMPRESSION: No evidence of acute cardiopulmonary disease.   Electronically Signed   By: Margaree Mackintosh M.D.   On: 02/02/2013 17:34   Dg Abd Portable 1v  02/23/2013   CLINICAL DATA:  Lower abdominal pain  EXAM: PORTABLE ABDOMEN - 1 VIEW  COMPARISON:  MR ABDOMEN W/ CM dated 02/13/2013; CT CHEST W/CM dated 02/08/2013  FINDINGS: Small amount residual oral contrast within the colon from CT  of 02/09/2003. There are mildly dilated loops of small bowel in the left lower quadrant measuring up to 4 cm. . No evidence of intraperitoneal free air on this supine exam.  IMPRESSION: 1. Dilated loops of small bowel in the left lower quadrant consistent with focal ileus or partial obstruction.  2. No evidence of high-grade obstruction. There is contrast within the rectum from CT of 02/08/2013.   Electronically Signed   By: Suzy Bouchard M.D.   On: 02/23/2013 12:38    ASSESSMENT AND PLAN 63 yo male with hx CAD/CHF was admitted 12/13 with ARF, hypotension, anemia. Colon mass -> cards consult pre-op, surgery 12/23, wound probs w/surg 12/29, Torsades 01/04 req shock  x 1 -> afib, K+ 3.5, Mg 1.7. Torsades again early 01/05, shock into SR. SR since then. Echo 01/04, EF 40-45%.   Torsades de pointes - in the setting of long QT 127 ms, metabolic derangements. No indication of ischemia. Had been off meds due to surgery. Coreg, spironolactone started 01/06. Continue to follow labs, recheck Mg in am. Would keep 2.0.   Atrial fibrillation - no recurrence, on BB.  No chronic anticoagulation unless recurrent atrial fib in view of his history of chronic blood loss anemia.  Acute on chronic diastolic heart failure - Volume status OK now, continue to follow I/O, daily weights.   Hypotension - resolved   Signed, Darlin Coco MD

## 2013-03-01 ENCOUNTER — Inpatient Hospital Stay (HOSPITAL_COMMUNITY)
Admission: RE | Admit: 2013-03-01 | Discharge: 2013-03-07 | DRG: 945 | Disposition: A | Discharge status: Home Health | Payer: Medicare Other | Source: Intra-hospital | Attending: Physical Medicine & Rehabilitation | Admitting: Physical Medicine & Rehabilitation

## 2013-03-01 DIAGNOSIS — Z9861 Coronary angioplasty status: Secondary | ICD-10-CM | POA: Diagnosis not present

## 2013-03-01 DIAGNOSIS — Z91199 Patient's noncompliance with other medical treatment and regimen due to unspecified reason: Secondary | ICD-10-CM | POA: Diagnosis not present

## 2013-03-01 DIAGNOSIS — N179 Acute kidney failure, unspecified: Secondary | ICD-10-CM

## 2013-03-01 DIAGNOSIS — Z833 Family history of diabetes mellitus: Secondary | ICD-10-CM | POA: Diagnosis not present

## 2013-03-01 DIAGNOSIS — Z933 Colostomy status: Secondary | ICD-10-CM | POA: Diagnosis not present

## 2013-03-01 DIAGNOSIS — C189 Malignant neoplasm of colon, unspecified: Secondary | ICD-10-CM | POA: Diagnosis not present

## 2013-03-01 DIAGNOSIS — I4901 Ventricular fibrillation: Secondary | ICD-10-CM | POA: Diagnosis present

## 2013-03-01 DIAGNOSIS — I472 Ventricular tachycardia, unspecified: Secondary | ICD-10-CM | POA: Diagnosis present

## 2013-03-01 DIAGNOSIS — E7800 Pure hypercholesterolemia, unspecified: Secondary | ICD-10-CM | POA: Diagnosis present

## 2013-03-01 DIAGNOSIS — R5381 Other malaise: Secondary | ICD-10-CM

## 2013-03-01 DIAGNOSIS — E669 Obesity, unspecified: Secondary | ICD-10-CM | POA: Diagnosis present

## 2013-03-01 DIAGNOSIS — Z87891 Personal history of nicotine dependence: Secondary | ICD-10-CM

## 2013-03-01 DIAGNOSIS — Z9119 Patient's noncompliance with other medical treatment and regimen: Secondary | ICD-10-CM

## 2013-03-01 DIAGNOSIS — I2510 Atherosclerotic heart disease of native coronary artery without angina pectoris: Secondary | ICD-10-CM | POA: Diagnosis present

## 2013-03-01 DIAGNOSIS — I5033 Acute on chronic diastolic (congestive) heart failure: Secondary | ICD-10-CM | POA: Diagnosis present

## 2013-03-01 DIAGNOSIS — I10 Essential (primary) hypertension: Secondary | ICD-10-CM | POA: Diagnosis present

## 2013-03-01 DIAGNOSIS — I4729 Other ventricular tachycardia: Secondary | ICD-10-CM | POA: Diagnosis present

## 2013-03-01 DIAGNOSIS — J449 Chronic obstructive pulmonary disease, unspecified: Secondary | ICD-10-CM | POA: Diagnosis present

## 2013-03-01 DIAGNOSIS — Z5189 Encounter for other specified aftercare: Secondary | ICD-10-CM | POA: Diagnosis not present

## 2013-03-01 DIAGNOSIS — K5900 Constipation, unspecified: Secondary | ICD-10-CM | POA: Diagnosis present

## 2013-03-01 DIAGNOSIS — I469 Cardiac arrest, cause unspecified: Secondary | ICD-10-CM | POA: Diagnosis present

## 2013-03-01 DIAGNOSIS — I5032 Chronic diastolic (congestive) heart failure: Secondary | ICD-10-CM | POA: Diagnosis present

## 2013-03-01 DIAGNOSIS — I509 Heart failure, unspecified: Secondary | ICD-10-CM | POA: Diagnosis present

## 2013-03-01 DIAGNOSIS — I4891 Unspecified atrial fibrillation: Secondary | ICD-10-CM | POA: Diagnosis not present

## 2013-03-01 DIAGNOSIS — R633 Feeding difficulties, unspecified: Secondary | ICD-10-CM | POA: Diagnosis present

## 2013-03-01 DIAGNOSIS — D500 Iron deficiency anemia secondary to blood loss (chronic): Secondary | ICD-10-CM | POA: Diagnosis present

## 2013-03-01 DIAGNOSIS — Z8249 Family history of ischemic heart disease and other diseases of the circulatory system: Secondary | ICD-10-CM

## 2013-03-01 DIAGNOSIS — J4489 Other specified chronic obstructive pulmonary disease: Secondary | ICD-10-CM | POA: Diagnosis present

## 2013-03-01 DIAGNOSIS — R5383 Other fatigue: Secondary | ICD-10-CM | POA: Diagnosis present

## 2013-03-01 DIAGNOSIS — Z7982 Long term (current) use of aspirin: Secondary | ICD-10-CM

## 2013-03-01 LAB — BASIC METABOLIC PANEL
BUN: 4 mg/dL — AB (ref 6–23)
CO2: 27 mEq/L (ref 19–32)
CREATININE: 1.13 mg/dL (ref 0.50–1.35)
Calcium: 9.6 mg/dL (ref 8.4–10.5)
Chloride: 106 mEq/L (ref 96–112)
GFR, EST AFRICAN AMERICAN: 80 mL/min — AB (ref >90)
GFR, EST NON AFRICAN AMERICAN: 69 mL/min — AB (ref >90)
Glucose, Bld: 90 mg/dL (ref 70–99)
Potassium: 4 mEq/L (ref 3.7–5.3)
Sodium: 144 mEq/L (ref 137–147)

## 2013-03-01 LAB — MAGNESIUM: Magnesium: 1.7 mg/dL (ref 1.5–2.5)

## 2013-03-01 MED ORDER — ENOXAPARIN SODIUM 60 MG/0.6ML ~~LOC~~ SOLN
60.0000 mg | SUBCUTANEOUS | Status: DC
  Administered 2013-03-02 – 2013-03-06 (×5): 60 mg via SUBCUTANEOUS
  Filled 2013-03-01 (×6): qty 0.6

## 2013-03-01 MED ORDER — SPIRONOLACTONE 25 MG PO TABS
25.0000 mg | ORAL_TABLET | Freq: Every day | ORAL | Status: DC
  Administered 2013-03-02 – 2013-03-07 (×6): 25 mg via ORAL
  Filled 2013-03-01 (×7): qty 1

## 2013-03-01 MED ORDER — MAGNESIUM OXIDE 400 (241.3 MG) MG PO TABS
400.0000 mg | ORAL_TABLET | Freq: Every day | ORAL | Status: DC
  Administered 2013-03-01: 400 mg via ORAL
  Filled 2013-03-01: qty 1

## 2013-03-01 MED ORDER — METHOCARBAMOL 500 MG PO TABS: 500.0000 mg | ORAL_TABLET | Freq: Four times a day (QID) | ORAL | Status: DC | PRN

## 2013-03-01 MED ORDER — MAGNESIUM OXIDE 400 (241.3 MG) MG PO TABS
400.0000 mg | ORAL_TABLET | Freq: Every day | ORAL | Status: DC
  Administered 2013-03-02 – 2013-03-07 (×6): 400 mg via ORAL
  Filled 2013-03-01 (×7): qty 1

## 2013-03-01 MED ORDER — SALINE SPRAY 0.65 % NA SOLN
1.0000 | NASAL | Status: DC | PRN
  Filled 2013-03-01: qty 44

## 2013-03-01 MED ORDER — ALBUTEROL SULFATE (2.5 MG/3ML) 0.083% IN NEBU: 2.5000 mg | INHALATION_SOLUTION | RESPIRATORY_TRACT | Status: DC | PRN

## 2013-03-01 MED ORDER — DIPHENHYDRAMINE HCL 12.5 MG/5ML PO ELIX
12.5000 mg | ORAL_SOLUTION | Freq: Four times a day (QID) | ORAL | Status: DC | PRN
  Filled 2013-03-01: qty 10

## 2013-03-01 MED ORDER — ASPIRIN 81 MG PO CHEW
81.0000 mg | CHEWABLE_TABLET | Freq: Every day | ORAL | Status: DC
  Administered 2013-03-02 – 2013-03-07 (×6): 81 mg via ORAL
  Filled 2013-03-01 (×7): qty 1

## 2013-03-01 MED ORDER — LEVALBUTEROL TARTRATE 45 MCG/ACT IN AERO: 1.0000 | INHALATION_SPRAY | RESPIRATORY_TRACT | Status: DC | PRN

## 2013-03-01 MED ORDER — FLEET ENEMA 7-19 GM/118ML RE ENEM: 1.0000 | ENEMA | Freq: Once | RECTAL | Status: AC | PRN

## 2013-03-01 MED ORDER — FERROUS SULFATE 325 (65 FE) MG PO TABS
325.0000 mg | ORAL_TABLET | Freq: Every day | ORAL | Status: DC
  Administered 2013-03-02 – 2013-03-07 (×6): 325 mg via ORAL
  Filled 2013-03-01 (×7): qty 1

## 2013-03-01 MED ORDER — TRAZODONE HCL 50 MG PO TABS: 25.0000 mg | ORAL_TABLET | Freq: Every evening | ORAL | Status: DC | PRN

## 2013-03-01 MED ORDER — HYDROCODONE-ACETAMINOPHEN 5-325 MG PO TABS
1.0000 | ORAL_TABLET | ORAL | Status: DC | PRN
  Administered 2013-03-03: 2 via ORAL
  Administered 2013-03-04 (×2): 1 via ORAL
  Administered 2013-03-04: 2 via ORAL
  Administered 2013-03-05: 1 via ORAL
  Administered 2013-03-05: 2 via ORAL
  Filled 2013-03-01: qty 1
  Filled 2013-03-01 (×2): qty 2
  Filled 2013-03-01 (×2): qty 1
  Filled 2013-03-01: qty 2

## 2013-03-01 MED ORDER — POTASSIUM CHLORIDE 20 MEQ/15ML (10%) PO LIQD
40.0000 meq | Freq: Every day | ORAL | Status: DC
  Administered 2013-03-03 – 2013-03-07 (×5): 40 meq via ORAL
  Filled 2013-03-01 (×7): qty 30

## 2013-03-01 MED ORDER — PANTOPRAZOLE SODIUM 40 MG PO TBEC
40.0000 mg | DELAYED_RELEASE_TABLET | Freq: Every day | ORAL | Status: DC
  Administered 2013-03-01 – 2013-03-06 (×6): 40 mg via ORAL
  Filled 2013-03-01 (×8): qty 1

## 2013-03-01 MED ORDER — BOOST / RESOURCE BREEZE PO LIQD
1.0000 | Freq: Three times a day (TID) | ORAL | Status: DC
  Administered 2013-03-01: 1 via ORAL

## 2013-03-01 MED ORDER — GUAIFENESIN-DM 100-10 MG/5ML PO SYRP: 5.0000 mL | ORAL_SOLUTION | Freq: Four times a day (QID) | ORAL | Status: DC | PRN

## 2013-03-01 MED ORDER — ALUM & MAG HYDROXIDE-SIMETH 200-200-20 MG/5ML PO SUSP: 30.0000 mL | ORAL | Status: DC | PRN

## 2013-03-01 MED ORDER — SIMETHICONE 40 MG/0.6ML PO SUSP
80.0000 mg | Freq: Four times a day (QID) | ORAL | Status: DC
  Administered 2013-03-01 – 2013-03-07 (×23): 80 mg via ORAL
  Filled 2013-03-01 (×26): qty 1.2

## 2013-03-01 MED ORDER — ONDANSETRON HCL 4 MG/2ML IJ SOLN: 4.0000 mg | Freq: Four times a day (QID) | INTRAMUSCULAR | Status: DC | PRN

## 2013-03-01 MED ORDER — SENNOSIDES-DOCUSATE SODIUM 8.6-50 MG PO TABS: 1.0000 | ORAL_TABLET | Freq: Every evening | ORAL | Status: DC | PRN

## 2013-03-01 MED ORDER — BENEPROTEIN PO POWD
1.0000 | Freq: Three times a day (TID) | ORAL | Status: DC
  Administered 2013-03-02 – 2013-03-07 (×15): 6 g via ORAL
  Filled 2013-03-01: qty 227

## 2013-03-01 MED ORDER — CARVEDILOL 6.25 MG PO TABS
6.2500 mg | ORAL_TABLET | Freq: Two times a day (BID) | ORAL | Status: DC
  Administered 2013-03-02 – 2013-03-07 (×11): 6.25 mg via ORAL
  Filled 2013-03-01 (×13): qty 1

## 2013-03-01 NOTE — Progress Notes (Signed)
Rehab admissions - Met with pt and he is agreeable to coming to inpatient rehab. Confirmed with wife on phone that [facility] is secondary as she is retired. Wife also in agreement with inpatient rehab stay. Bed available and will admit today.   Thank you, Nanetta Batty, PT Rehab. Admissions Coordinator

## 2013-03-01 NOTE — Consult Note (Signed)
Physical Medicine and Rehabilitation Consult Reason for Consult: Decondtioning Referring Physician: Dr. Loreta Ave   HPI: Larry [name] is a 63 y.o. male with history of HTN, NSVT, COPD, admitted 02/02/13 with 5 week history of progressive weakness, constipation x 2 weeks and difficulty eating or drinking. He was found to have acute renal failure with Cr -7.83, anemia 7.3 as well as obstructing colon mass. Colonoscopy with biopsy revealed adenocarcinoma and patient underwent Exp lap with partial colectomy with colostomy on 12/23 by Dr. Lindie Spruce. Abdominal wound closure with VAC placement on 02/18/13.  On 02/24/13, patient with V fib cardiac arrest and CPR initiated and patient shocked with subsequent  A fib with RVR.   Electrolyte abnormality treated and 2D echo with EF 40-45% with periapical akinesis and biatrial enlargement. Patient with recurrent torsades on 01/05 and shocked in NSR. Wound healing well and will not need VAC per [facility]. Appetite slowly improving bu patient deconditioned. MD, PT,OT recommending CIR.    Review of Systems  Constitutional: Positive for malaise/fatigue.  HENT: Negative for hearing loss.   Eyes: Negative for double vision.  Gastrointestinal: Positive for abdominal pain. Negative for vomiting.  Musculoskeletal: Negative for myalgias and neck pain.  Neurological: Positive for focal weakness. Negative for headaches.    Past Medical History  Diagnosis Date  . Hypertension   . Hypercholesteremia   . CHF (congestive heart failure)     has diastolic heart failure grade 1; EF is 45 to 50% per echo 05/2011  . Coronary artery disease   . COPD (chronic obstructive pulmonary disease)   . Asthma   . Obesity   . Noncompliance   . NSVT (nonsustained ventricular tachycardia)     beta blocker restarted  . Atrial tachycardia     managed on beta blocker therapy   Past Surgical History  Procedure Laterality Date  . Coronary stent placement      x2  . Cardiac catheterization   11/12/2008    stent x 2 to RCA  . Colonoscopy N/A 02/08/2013    Procedure: COLONOSCOPY;  Surgeon: Theda Belfast, MD;  Location: [facility] ENDOSCOPY;  Service: Endoscopy;  Laterality: N/A;  . Esophagogastroduodenoscopy N/A 02/08/2013    Procedure: ESOPHAGOGASTRODUODENOSCOPY (EGD);  Surgeon: Theda Belfast, MD;  Location: [facility] ENDOSCOPY;  Service: Endoscopy;  Laterality: N/A;  . Laparotomy N/A 02/12/2013    Procedure: EXPLORATORY LAPAROTOMY PARTIAL COLECTOMY WITH COLOSTOMY;  Surgeon: Cherylynn Ridges, MD;  Location: [facility] OR;  Service: General;  Laterality: N/A;  . Laparotomy N/A 02/18/2013    Procedure: EXPLORATORY LAPAROTOMY/Closure of Wound;  Surgeon: Axel Filler, MD;  Location: [facility] OR;  Service: General;  Laterality: N/A;   Family History  Problem Relation Age of Onset  . Hypertension Father   . Heart disease Father   . Diabetes Father   . Hypertension Mother    Social History:  reports that he quit smoking about 4 years ago. His smoking use included Cigarettes. He smoked 0.00 packs per day. He has never used smokeless tobacco. He reports that he does not drink alcohol or use illicit drugs. Allergies: No Known Allergies Medications Prior to Admission  Medication Sig Dispense Refill  . aspirin EC 325 MG tablet Take 325 mg by mouth daily.      . carvedilol (COREG) 25 MG tablet Take 25 mg by mouth daily.      . clopidogrel (PLAVIX) 75 MG tablet Take 75 mg by mouth daily with breakfast.      . diltiazem (CARDIZEM CD) 240  MG 24 hr capsule Take 240 mg by mouth daily.      . furosemide (LASIX) 40 MG tablet Take 40 mg by mouth 2 (two) times daily.      [name] Kitchen levalbuterol (XOPENEX HFA) 45 MCG/ACT inhaler Inhale 1-2 puffs into the lungs every 4 (four) hours as needed for wheezing.      [name] Kitchen lisinopril (PRINIVIL,ZESTRIL) 20 MG tablet Take 20 mg by mouth daily.      . nitroGLYCERIN (NITROSTAT) 0.4 MG SL tablet Place 0.4 mg under the tongue every 5 (five) minutes as needed for chest pain.      . potassium chloride  SA (K-DUR,KLOR-CON) 20 MEQ tablet Take 20 mEq by mouth daily.      . rosuvastatin (CRESTOR) 40 MG tablet Take 40 mg by mouth daily.        Home: Home Living Family/patient expects to be discharged to:: Inpatient rehab Living Arrangements: Spouse/significant other Available Help at Discharge: Family;Available 24 hours/day Type of Home: House Home Access: Level entry Home Layout: Two level;Able to live on main level with bedroom/bathroom Home Equipment: Kasandra Knudsen - single point;Shower seat;Bedside commode;Walker - 2 wheels Additional Comments: pt was independent with all ADL's; has tub shower and reports he was able to stand and take shower; standard height commode  Functional History: Prior Function Comments: pt drives and is very independent; has been on disability since 2010 Functional Status:  Mobility: Bed Mobility Bed Mobility: Supine to Sit;Sitting - Scoot to Edge of Bed Rolling Left: 3: Mod assist Left Sidelying to Sit: 2: Max assist Supine to Sit: 4: Min assist;With rails Sitting - Scoot to [employer] of Bed: 4: Min guard Sit to Supine: Not Tested (comment) Transfers Transfers: Sit to Stand;Stand to Sit Sit to Stand: 1: +2 Total assist;From elevated surface;With upper extremity assist;From bed Sit to Stand: Patient Percentage: 70% Stand to Sit: 3: Mod assist;With upper extremity assist;With armrests;To chair/3-in-1 Ambulation/Gait Ambulation/Gait Assistance: 4: Min assist (+ 1 to follow with chair) Ambulation Distance (Feet): 150 Feet Assistive device: Rolling walker Ambulation/Gait Assistance Details: Slightly unsteady gait needing incr support initially.  Cues needed for sequencing and safety.   Gait Pattern: Step-through pattern;Decreased stride length;Trunk flexed Gait velocity: slow Stairs: No Wheelchair Mobility Wheelchair Mobility: No  ADL: ADL Grooming: Set up Where Assessed - Grooming: Supported sitting Upper Body Bathing: Minimal assistance Where Assessed - Upper  Body Bathing: Supported sitting Lower Body Bathing: Maximal assistance Where Assessed - Lower Body Bathing: Supported sit to stand Upper Body Dressing: Minimal assistance Where Assessed - Upper Body Dressing: Supported sitting Lower Body Dressing: Maximal assistance Where Assessed - Lower Body Dressing: Supported sit to Lobbyist: Moderate assistance Equipment Used: Gait belt;Rolling walker Transfers/Ambulation Related to ADLs: Pt using momentum to complete sit - stand. Pt required 5 trials before being able to stand ADL Comments: easily fatigues. Max A with LB ADL. Will benefit from AE  Cognition: Cognition Overall Cognitive Status: Within Functional Limits for tasks assessed Orientation Level: Oriented X4 Cognition Arousal/Alertness: Awake/alert Behavior During Therapy: WFL for tasks assessed/performed Overall Cognitive Status: Within Functional Limits for tasks assessed  Blood pressure 149/80, pulse 91, temperature 98 F (36.7 C), temperature source Oral, resp. rate 18, height 5' 10.5" (1.791 m), weight 126 kg (277 lb 12.5 oz), SpO2 99.00%. Physical Exam  Constitutional: He appears well-developed and well-nourished. No distress.  Obese   HENT:  Head: Normocephalic and atraumatic.  Right Ear: External ear normal.  Left Ear: External ear normal.  Eyes: Conjunctivae and EOM  are normal. Pupils are equal, round, and reactive to light. Right eye exhibits no discharge. Left eye exhibits no discharge. No scleral icterus.  Neck: No JVD present. No tracheal deviation present. No thyromegaly present.  Cardiovascular: Normal rate and regular rhythm.  Exam reveals no friction rub.   No murmur heard. Respiratory: No respiratory distress. He has no wheezes. He has no rales.  GI: He exhibits distension. There is tenderness.  Ostomy in place, sealed. Normal appearing stool. Vacuum dressing in place over midline measuring about 4-5 inches in length and 1 inch in width   Lymphadenopathy:    He has no cervical adenopathy.  Neurological:  UE are 4+/5.  LE are 2 to 2+ prox to 4/5 distally.  No gross sensory deficits.   Skin: He is not diaphoretic.  Some scaling/ plaques over the legs  Psychiatric:  A little flat but generally quite appropriate.    Results for orders placed during the hospital encounter of 02/02/13 (from the past 24 hour(s))  MAGNESIUM     Status: None   Collection Time    03/01/13  4:00 AM      Result Value Range   Magnesium 1.7  1.5 - 2.5 mg/dL  BASIC METABOLIC PANEL     Status: Abnormal   Collection Time    03/01/13  4:00 AM      Result Value Range   Sodium 144  137 - 147 mEq/L   Potassium 4.0  3.7 - 5.3 mEq/L   Chloride 106  96 - 112 mEq/L   CO2 27  19 - 32 mEq/L   Glucose, Bld 90  70 - 99 mg/dL   BUN 4 (*) 6 - 23 mg/dL   Creatinine, Ser 1.69  0.50 - 1.35 mg/dL   Calcium 9.6  8.4 - 45.0 mg/dL   GFR calc non Af Amer 69 (*) >90 mL/min   GFR calc Af Amer 80 (*) >90 mL/min   No results found.  Assessment/Plan: Diagnosis: deconditioning related to colon ca s/p colectomy 1. Does the need for close, 24 hr/day medical supervision in concert with the patient's rehab needs make it unreasonable for this patient to be served in a less intensive setting? Yes 2. Co-Morbidities requiring supervision/potential complications: htn, cad, afib, copd, morbid obesity 3. Due to bladder management, bowel management, safety, skin/wound care, disease management, medication administration, pain management and patient education, does the patient require 24 hr/day rehab nursing? Yes 4. Does the patient require coordinated care of a physician, rehab nurse, PT (1-2 hrs/day, 5 days/week) and OT (1-2 hrs/day, 5 days/week) to address physical and functional deficits in the context of the above medical diagnosis(es)? Yes Addressing deficits in the following areas: balance, endurance, locomotion, strength, transferring, bowel/bladder control, bathing, dressing,  feeding, grooming, toileting and psychosocial support 5. Can the patient actively participate in an intensive therapy program of at least 3 hrs of therapy per day at least 5 days per week? Yes 6. The potential for patient to make measurable gains while on inpatient rehab is excellent 7. Anticipated functional outcomes upon discharge from inpatient rehab are mod I with PT, mod I to set up with OT, n/a  with SLP. 8. Estimated rehab length of stay to reach the above functional goals is: 8-12 days 9. Does the patient have adequate social supports to accommodate these discharge functional goals? Yes 10. Anticipated D/C setting: Home 11. Anticipated post D/C treatments: HH therapy 12. Overall Rehab/Functional Prognosis: excellent  RECOMMENDATIONS: This patient's condition is appropriate for  continued rehabilitative care in the following setting: CIR Patient has agreed to participate in recommended program. Yes Note that insurance prior authorization may be required for reimbursement for recommended care.  Comment: Rehab Admissions Coordinator to follow up.  Thanks,  Meredith Staggers, MD, Mellody Drown     03/01/2013

## 2013-03-01 NOTE — Progress Notes (Signed)
[facility] Progress Note   KIELAN FISK [id number] DOB: 08/14/1950 DOA: 02/02/2013 PCP: No primary provider on file.  Admit HPI / Brief Narrative: 63 yo man w/ a history of HTN, CHF (EF 45-50%), CAD, COPD, and NSVT, who presented 12/13 with 4 weeks of generalized weakness.  He was found to have AKI, anemia, and a colon adenocarcinoma.   Summary of hospitalization  The patient was admitted 12/13 with 4 weeks of generalized weakness, found to be hypotensive, with an AKI (Cr = 7.8) and anemia (Hb = 7.1). Cr improved with IV fluids, though anemia required a total of 4 units of pRBC's. On 12/19, colonoscopy showed an obstructing mass in the transverse colon (EGD the same day showed some antral erosions, which were not actively bleeding). Follow-up CT abd showed some low attenuation areas in the liver concerning for mets. The patient was taken to the OR 12/23 for ex lap partial colectomy with coloscopy, with pathology later confirming adenocarcinoma. 18 lymph nodes were resected, but all were benign. Recovery was complicated by wound dehissance, requiring a return to the OR 12/29. After the procedure, the patient developed atrial tachycardia, so a dilt drip was started (12/29-1/2). On 1/4, the patient had a cardiac arrest, requiring defibrillation, and was subsequently transferred to the ICU. QT was found to be > 700, with mild hypokalemia and hypomagnesemia implicated as the cause. Lidocaine drip was started. By 1/6, QTc had decreased to 499, lidocaine drip was d/c'ed, and patient was transferred out of ICU to stepdown.  [facility] resumed care of the pt on 61/07.  HPI/Subjective: Pt is resting comfortably. Appetite has improved today.  Denies n/v, abdom pain, sob, or cp.  Reports good consistent outpt via ostomy.  Wife present and all questions answered.  Assessment/Plan:  Colonic adenocarcinoma  S/p resection - ostomy in place - Gen Surgery following  Will need oncology follow up, but  still too deconditioned to consider treatment.  Cardiac arrest 1/04 ([place]) in the setting of long QT (Q000111Q ms) + metabolic derangements - Cardiology is following - keep Mg 2.0.  Acute on chronic combined systolic and diastolic CHF (EF [id number] via TTE 1/04) Well compensated at present  NSVT with Prolonged QT Cardiology managing - was on lidocaine drip earlier in hospital stay   Atrial tachycardia / A fib As per Cardiology - has not recurred of late   Acute kidney injury Resolved  COPD Well compensated at present   Anemia combination of anemia of chronic disease and blood loss from antral ulcers/colon cancer - has received 5 units of PRBCs  Hb has since remained stable at around 8.6-8.8.  Code Status: FULL Family Communication: spoke w/ pt and wife at bedside and updated om plan of care. Disposition Plan: PT/OT recs CIR. CIR consult requested. Consultants: Gen Surgery [facility] >> PCCM >> [facility]  Procedures: 12/19 - Colonoscopy showed obstructing mass in transverse colon, EGD showed only some antral erosions. CT abd showed possible liver mets  12/23 - pt to OR for ex lap partial colectomy w/ colostomy, pathology showed adenocarcinoma with 18 benign lymph nodes.  12/29 - back to OR for wound dehissnace. Pt developed paroxysmal atrial tach during procedure, started dilt drip  1/4 - pt had cardiac arrest, requiring defibrillation. QT found to be > 700 msec. K and Mg replaced, lidocaine drip started ETT 12/29>>12/29  Antibiotics: none   DVT prophylaxis: lovenox  Objective: Blood pressure 149/80, pulse 91, temperature 98 F (36.7 C), temperature source Oral, resp. rate 18,  height 5' 10.5" (1.791 m), weight 126 kg (277 lb 12.5 oz), SpO2 99.00%.  Intake/Output Summary (Last 24 hours) at 03/01/13 1356 Last data filed at 03/01/13 1356  Gross per 24 hour  Intake    180 ml  Output   1450 ml  Net  -1270 ml   Exam: General: No acute respiratory distress Lungs: Clear to  auscultation bilaterally without wheezes or crackles Cardiovascular: Regular rate and rhythm without murmur gallop or rub normal S1 and S2 Abdomen: midline wound VAC in place, nondistended, soft, bowel sounds hypoactive, no rebound, no ascites, no appreciable mass Extremities: No significant cyanosis, clubbing, edema bilateral lower extremities  Data Reviewed: Basic Metabolic Panel:  Recent Labs Lab 02/24/13 0415 02/24/13 1038 02/25/13 0429 02/26/13 0500 02/27/13 0236 03/01/13 0400  NA 142 142 143 140 141 144  K 3.4* 4.2 3.5* 3.9 4.1 4.0  CL 107 106 107 104 108 106  CO2 21 24 25 24 24 27   GLUCOSE 126* 91 94 99 115* 90  BUN 3* 3* <3* <3* 3* 4*  CREATININE 1.00 0.99 0.99 1.01 1.16 1.13  CALCIUM 8.7 8.7 8.9 9.1 9.2 9.6  MG 1.7 2.3 2.0  --   --  1.7   Liver Function Tests: No results found for this basename: AST, ALT, ALKPHOS, BILITOT, PROT, ALBUMIN,  in the last 168 hours  CBC:  Recent Labs Lab 02/24/13 1038 02/26/13 0500 02/27/13 0236 02/28/13 0510  WBC 6.8 5.1 5.4 5.4  HGB 9.7* 8.8* 8.6* 8.8*  HCT 31.7* 28.8* 27.9* 28.6*  MCV 89.8 90.3 90.9 90.8  PLT 286 264 254 247   Cardiac Enzymes:  Recent Labs Lab 02/24/13 0415 02/24/13 1038 02/24/13 1525 02/24/13 1846  TROPONINI <0.30 <0.30 <0.30 <0.30   CBG: No results found for this basename: GLUCAP,  in the last 168 hours  Recent Results (from the past 240 hour(s))  CULTURE, EXPECTORATED SPUTUM-ASSESSMENT     Status: None   Collection Time    02/20/13  6:18 PM      Result Value Range Status   Specimen Description SPUTUM   Final   Special Requests NONE   Final   Sputum evaluation     Final   Value: THIS SPECIMEN IS ACCEPTABLE. RESPIRATORY CULTURE REPORT TO FOLLOW.   Report Status 02/20/2013 FINAL   Final  CULTURE, RESPIRATORY (NON-EXPECTORATED)     Status: None   Collection Time    02/20/13  6:18 PM      Result Value Range Status   Specimen Description SPUTUM   Final   Special Requests NONE   Final   Gram  Stain     Final   Value: FEW WBC PRESENT, PREDOMINANTLY PMN     FEW SQUAMOUS EPITHELIAL CELLS PRESENT     FEW GRAM NEGATIVE RODS     FEW GRAM POSITIVE COCCI IN PAIRS     Performed at [facility]   Culture     Final   Value: NORMAL OROPHARYNGEAL FLORA     Performed at [facility]   Report Status 02/23/2013 FINAL   Final  MRSA PCR SCREENING     Status: None   Collection Time    02/24/13  4:34 AM      Result Value Range Status   MRSA by PCR NEGATIVE  NEGATIVE Final   Comment:            The GeneXpert MRSA Assay (FDA     approved for NASAL specimens  only), is one component of a     comprehensive MRSA colonization     surveillance program. It is not     intended to diagnose MRSA     infection nor to guide or     monitor treatment for     MRSA infections.     Studies:  Recent x-ray studies have been reviewed in detail by the Attending Physician  Scheduled Meds:  Scheduled Meds: . aspirin  81 mg Oral Daily  . carvedilol  6.25 mg Oral BID WC  . enoxaparin (LOVENOX) injection  60 mg Subcutaneous Q24H  . ferrous sulfate  325 mg Oral Q breakfast  . magnesium oxide  400 mg Oral Daily  . pantoprazole  40 mg Oral QHS  . potassium chloride  40 mEq Oral Daily  . spironolactone  25 mg Oral Daily    Time spent on care of this patient: 35 mins   Lelon Frohlich Pager: [place]  [phone]  If 7PM-7AM, please contact night-coverage [email or web address] Password [email or web address] 03/01/2013, 1:56 PM   LOS: 27 days

## 2013-03-01 NOTE — H&P (View-Only) (Signed)
Physical Medicine and Rehabilitation Admission H&P    Chief Complaint  Patient presents with  . Deconditioning due to multiple medical issues   HPI: Larry [name] is a 62 y.o. male with history of HTN, NSVT, COPD, admitted 02/02/13 with 5 week history of progressive weakness, constipation x 2 weeks and difficulty eating or drinking. He was found to have acute renal failure with Cr -7.83, anemia 7.3 as well as obstructing colon mass. Colonoscopy with biopsy revealed adenocarcinoma and patient underwent Exp lap with partial colectomy with colostomy on 12/23 by Dr. Hulen Skains. Abdominal wound closure with VAC placement on 02/18/13. On 02/24/13, patient with V fib cardiac arrest and CPR initiated and patient shocked with subsequent A fib with RVR. Electrolyte abnormality treated and 2D echo with EF 40-45% with periapical akinesis and biatrial enlargement. Patient with recurrent torsades on 01/05 and shocked in NSR. Wound healing well and will not need VAC per CCS. Appetite slowly improving but patient noted to be severely deconditioned. Therapy team recommended CIR and patient admitted today.    Review of Systems  HENT: Negative for hearing loss.   Eyes: Negative for blurred vision and double vision.  Respiratory: Negative for cough and shortness of breath.   Cardiovascular: Negative for chest pain and palpitations.  Gastrointestinal: Positive for abdominal pain. Negative for nausea, vomiting and constipation.       Bloating and gas.  Genitourinary: Negative for dysuria, urgency and frequency.  Musculoskeletal: Negative for back pain and myalgias.  Neurological: Positive for weakness. Negative for speech change, focal weakness and headaches.    Past Medical History  Diagnosis Date  . Hypertension   . Hypercholesteremia   . CHF (congestive heart failure)     has diastolic heart failure grade 1; EF is 45 to 50% per echo 05/2011  . Coronary artery disease   . COPD (chronic obstructive pulmonary  disease)   . Asthma   . Obesity   . Noncompliance   . NSVT (nonsustained ventricular tachycardia)     beta blocker restarted  . Atrial tachycardia     managed on beta blocker therapy   Past Surgical History  Procedure Laterality Date  . Coronary stent placement      x2  . Cardiac catheterization  11/12/2008    stent x 2 to RCA  . Colonoscopy N/A 02/08/2013    Procedure: COLONOSCOPY;  Surgeon: [name], MD;  Location: [facility];  Service: Endoscopy;  Laterality: N/A;  . Esophagogastroduodenoscopy N/A 02/08/2013    Procedure: ESOPHAGOGASTRODUODENOSCOPY (EGD);  Surgeon: [name], MD;  Location: [facility] ENDOSCOPY;  Service: Endoscopy;  Laterality: N/A;  . Laparotomy N/A 02/12/2013    Procedure: EXPLORATORY LAPAROTOMY PARTIAL COLECTOMY WITH COLOSTOMY;  Surgeon: [name], MD;  Location: [facility];  Service: General;  Laterality: N/A;  . Laparotomy N/A 02/18/2013    Procedure: EXPLORATORY LAPAROTOMY/Closure of Wound;  Surgeon: [name], MD;  Location: [facility] OR;  Service: General;  Laterality: N/A;   Family History  Problem Relation Age of Onset  . Hypertension Father   . Heart disease Father   . Diabetes Father   . Hypertension Mother    Social History:  Married. Disabled due to cardiac issues. Does not have primary care physician. He reports that he quit smoking about 4 years ago. His smoking use included Cigarettes. He smoked 0.00 packs per day. He has never used smokeless tobacco. He reports that he does not drink alcohol or use illicit drugs.   Allergies: No Known  Allergies   Medications Prior to Admission  Medication Sig Dispense Refill  . aspirin EC 325 MG tablet Take 325 mg by mouth daily.      . carvedilol (COREG) 25 MG tablet Take 25 mg by mouth daily.      . clopidogrel (PLAVIX) 75 MG tablet Take 75 mg by mouth daily with breakfast.      . diltiazem (CARDIZEM CD) 240 MG 24 hr capsule Take 240 mg by mouth daily.      . furosemide (LASIX) 40 MG tablet Take 40  mg by mouth 2 (two) times daily.      [name] Kitchen levalbuterol (XOPENEX HFA) 45 MCG/ACT inhaler Inhale 1-2 puffs into the lungs every 4 (four) hours as needed for wheezing.      [name] Kitchen lisinopril (PRINIVIL,ZESTRIL) 20 MG tablet Take 20 mg by mouth daily.      . nitroGLYCERIN (NITROSTAT) 0.4 MG SL tablet Place 0.4 mg under the tongue every 5 (five) minutes as needed for chest pain.      . potassium chloride SA (K-DUR,KLOR-CON) 20 MEQ tablet Take 20 mEq by mouth daily.      . rosuvastatin (CRESTOR) 40 MG tablet Take 40 mg by mouth daily.        Home: Home Living Family/patient expects to be discharged to:: Inpatient rehab Living Arrangements: Spouse/significant other Available Help at Discharge: Family;Available 24 hours/day Type of Home: House Home Access: Level entry Home Layout: Two level;Able to live on main level with bedroom/bathroom Home Equipment: Kasandra Knudsen - single point;Shower seat;Bedside commode;Walker - 2 wheels Additional Comments: pt was independent with all ADL's; has tub shower and reports he was able to stand and take shower; standard height commode   Functional History: Prior Function Comments: pt drives and is very independent; has been on disability since 2010  Functional Status:  Mobility: Bed Mobility Bed Mobility: Supine to Sit;Sitting - Scoot to Edge of Bed Rolling Left: 3: Mod assist Left Sidelying to Sit: 2: Max assist Supine to Sit: 4: Min assist;With rails Sitting - Scoot to [employer] of Bed: 4: Min guard Sit to Supine: Not Tested (comment) Transfers Transfers: Sit to Stand;Stand to Sit Sit to Stand: 1: +2 Total assist;From elevated surface;With upper extremity assist;From bed Sit to Stand: Patient Percentage: 70% Stand to Sit: 3: Mod assist;With upper extremity assist;With armrests;To chair/3-in-1 Ambulation/Gait Ambulation/Gait Assistance: 4: Min assist (+ 1 to follow with chair) Ambulation Distance (Feet): 150 Feet Assistive device: Rolling walker Ambulation/Gait  Assistance Details: Slightly unsteady gait needing incr support initially.  Cues needed for sequencing and safety.   Gait Pattern: Step-through pattern;Decreased stride length;Trunk flexed Gait velocity: slow Stairs: No Wheelchair Mobility Wheelchair Mobility: No  ADL: ADL Grooming: Set up Where Assessed - Grooming: Supported sitting Upper Body Bathing: Minimal assistance Where Assessed - Upper Body Bathing: Supported sitting Lower Body Bathing: Maximal assistance Where Assessed - Lower Body Bathing: Supported sit to stand Upper Body Dressing: Minimal assistance Where Assessed - Upper Body Dressing: Supported sitting Lower Body Dressing: Maximal assistance Where Assessed - Lower Body Dressing: Supported sit to Lobbyist: Moderate assistance Equipment Used: Gait belt;Rolling walker Transfers/Ambulation Related to ADLs: Pt using momentum to complete sit - stand. Pt required 5 trials before being able to stand ADL Comments: easily fatigues. Max A with LB ADL. Will benefit from AE  Cognition: Cognition Overall Cognitive Status: Within Functional Limits for tasks assessed Orientation Level: Oriented X4 Cognition Arousal/Alertness: Awake/alert Behavior During Therapy: WFL for tasks assessed/performed Overall Cognitive Status: Within Functional  Limits for tasks assessed  Physical Exam: Blood pressure 135/83, pulse 92, temperature 98 F (36.7 C), temperature source Oral, resp. rate 18, height 5' 10.5" (1.791 m), weight 126 kg (277 lb 12.5 oz), SpO2 98.00%. Physical Exam  Nursing note and vitals reviewed. Constitutional: He is oriented to person, place, and time. He appears well-developed and well-nourished.  obese  HENT:  Head: Normocephalic and atraumatic.  Right Ear: External ear normal.  Left Ear: External ear normal.  Eyes: Pupils are equal, round, and reactive to light.  Neck: Normal range of motion. Neck supple. No JVD present. No tracheal deviation present. No  thyromegaly present.  Cardiovascular: Normal rate and regular rhythm.  Exam reveals no friction rub.   No murmur heard. Respiratory: Effort normal and breath sounds normal. No respiratory distress. He has no wheezes.  GI: Bowel sounds are normal. He exhibits distension. There is tenderness.  Ostomy in place, sealed. Normal appearing stool. Vacuum dressing in place over midline measuring about 4-5 inches in length and 1 inch in width    Musculoskeletal:  Scaly plaques on bilateral knees. Bilateral feet with dry and large flaky plaques. Left heel with hard thick callus. Bilateral feet with big onychomycotic toe nails.   Lymphadenopathy:    He has no cervical adenopathy.  Neurological: He is alert and oriented to person, place, and time.  UE are 4+/5.  LE are 2 to 2+ prox to 4/5 distally.  No gross sensory deficits  Skin: Skin is warm and dry.  Psychiatric:  A little flat but generally quite appropriate    Results for orders placed during the hospital encounter of 02/02/13 (from the past 48 hour(s))  CBC     Status: Abnormal   Collection Time    02/28/13  5:10 AM      Result Value Range   WBC 5.4  4.0 - 10.5 K/uL   RBC 3.15 (*) 4.22 - 5.81 MIL/uL   Hemoglobin 8.8 (*) 13.0 - 17.0 g/dL   HCT 28.6 (*) 39.0 - 52.0 %   MCV 90.8  78.0 - 100.0 fL   MCH 27.9  26.0 - 34.0 pg   MCHC 30.8  30.0 - 36.0 g/dL   RDW 18.1 (*) 11.5 - 15.5 %   Platelets 247  150 - 400 K/uL  MAGNESIUM     Status: None   Collection Time    03/01/13  4:00 AM      Result Value Range   Magnesium 1.7  1.5 - 2.5 mg/dL  BASIC METABOLIC PANEL     Status: Abnormal   Collection Time    03/01/13  4:00 AM      Result Value Range   Sodium 144  137 - 147 mEq/L   Potassium 4.0  3.7 - 5.3 mEq/L   Chloride 106  96 - 112 mEq/L   CO2 27  19 - 32 mEq/L   Glucose, Bld 90  70 - 99 mg/dL   BUN 4 (*) 6 - 23 mg/dL   Creatinine, Ser 1.13  0.50 - 1.35 mg/dL   Calcium 9.6  8.4 - 10.5 mg/dL   GFR calc non Af Amer 69 (*) >90 mL/min    GFR calc Af Amer 80 (*) >90 mL/min   Comment: (NOTE)     The eGFR has been calculated using the CKD EPI equation.     This calculation has not been validated in all clinical situations.     eGFR's persistently <90 mL/min signify possible Chronic Kidney  Disease.   No results found.  Post Admission Physician Evaluation: 1. Functional deficits secondary  to deconditioning due colon cancer and subsequent colectomy. 2. Patient is admitted to receive collaborative, interdisciplinary care between the physiatrist, rehab nursing staff, and therapy team. 3. Patient's level of medical complexity and substantial therapy needs in context of that medical necessity cannot be provided at a lesser intensity of care such as a SNF. 4. Patient has experienced substantial functional loss from his/her baseline which was documented above under the "Functional History" and "Functional Status" headings.  Judging by the patient's diagnosis, physical exam, and functional history, the patient has potential for functional progress which will result in measurable gains while on inpatient rehab.  These gains will be of substantial and practical use upon discharge  in facilitating mobility and self-care at the household level. 5. Physiatrist will provide 24 hour management of medical needs as well as oversight of the therapy plan/treatment and provide guidance as appropriate regarding the interaction of the two. 6. 24 hour rehab nursing will assist with bladder management, bowel management, safety, skin/wound care, disease management, medication administration, pain management and patient education  and help integrate therapy concepts, techniques,education, etc. 7. PT will assess and treat for/with: Lower extremity strength, range of motion, stamina, balance, functional mobility, safety, adaptive techniques and equipment, pain mgt, pt/family education, egosupport.   Goals are: mod I. 8. OT will assess and treat for/with:  ADL's, functional mobility, safety, upper extremity strength, adaptive techniques and equipment, pain mgt, pt/family education, ego support.   Goals are: mod I to set up. 9. SLP will assess and treat for/with: n/a.  Goals are: n/a. 10. Case Management and Social Worker will assess and treat for psychological issues and discharge planning. 11. Team conference will be held weekly to assess progress toward goals and to determine barriers to discharge. 12. Patient will receive at least 3 hours of therapy per day at least 5 days per week. 13. ELOS: 7-10 days       14. Prognosis:  excellent   Medical Problem List and Plan: 1. DVT Prophylaxis/Anticoagulation: Mechanical:  Antiembolism stockings, knee (TED hose) Bilateral lower extremities Sequential compression devices, below knee Bilateral lower extremities 2. Pain Management: Prn medications effective for abdominal pain.  3. Mood:  Provide ego support. LCSW to follow for evaluation and support.  4. Neuropsych: This patient is capable of making decisions on his  own behalf. 5. Torsades/ A Fib: Continue coreg bid with Kdur and Mag ox as supplement for electrolyte abnormality. Recommendations to keep Mg > 2.0.  Recheck lytes Monday.  6. Acute on chronic CHF:  Continue aldactone and coreg. Monitor daily weights.  7. Colon cancer s/p colon RSXN:   - Add protein supplement to help promote wound healing.   -Reports poor appetite--dietary consult to help with food choices.   -ostomy care/mgt education 8. Wound care: continue with current vac  -may be able to remove soon and initiate standard wet to dry dressing as it is closing  Meredith Staggers, MD, [facility]   03/01/2013

## 2013-03-01 NOTE — H&P (Signed)
Physical Medicine and Rehabilitation Admission H&P    Chief Complaint  Patient presents with  . Deconditioning due to multiple medical issues   HPI: Larry [name] is a 63 y.o. male with history of HTN, NSVT, COPD, admitted 02/02/13 with 5 week history of progressive weakness, constipation x 2 weeks and difficulty eating or drinking. He was found to have acute renal failure with Cr -7.83, anemia 7.3 as well as obstructing colon mass. Colonoscopy with biopsy revealed adenocarcinoma and patient underwent Exp lap with partial colectomy with colostomy on 12/23 by Dr. Hulen Skains. Abdominal wound closure with VAC placement on 02/18/13. On 02/24/13, patient with V fib cardiac arrest and CPR initiated and patient shocked with subsequent A fib with RVR. Electrolyte abnormality treated and 2D echo with EF 40-45% with periapical akinesis and biatrial enlargement. Patient with recurrent torsades on 01/05 and shocked in NSR. Wound healing well and will not need VAC per CCS. Appetite slowly improving but patient noted to be severely deconditioned. Therapy team recommended CIR and patient admitted today.    Review of Systems  HENT: Negative for hearing loss.   Eyes: Negative for blurred vision and double vision.  Respiratory: Negative for cough and shortness of breath.   Cardiovascular: Negative for chest pain and palpitations.  Gastrointestinal: Positive for abdominal pain. Negative for nausea, vomiting and constipation.       Bloating and gas.  Genitourinary: Negative for dysuria, urgency and frequency.  Musculoskeletal: Negative for back pain and myalgias.  Neurological: Positive for weakness. Negative for speech change, focal weakness and headaches.    Past Medical History  Diagnosis Date  . Hypertension   . Hypercholesteremia   . CHF (congestive heart failure)     has diastolic heart failure grade 1; EF is 45 to 50% per echo 05/2011  . Coronary artery disease   . COPD (chronic obstructive pulmonary  disease)   . Asthma   . Obesity   . Noncompliance   . NSVT (nonsustained ventricular tachycardia)     beta blocker restarted  . Atrial tachycardia     managed on beta blocker therapy   Past Surgical History  Procedure Laterality Date  . Coronary stent placement      x2  . Cardiac catheterization  11/12/2008    stent x 2 to RCA  . Colonoscopy N/A 02/08/2013    Procedure: COLONOSCOPY;  Surgeon: [name], MD;  Location: [facility];  Service: Endoscopy;  Laterality: N/A;  . Esophagogastroduodenoscopy N/A 02/08/2013    Procedure: ESOPHAGOGASTRODUODENOSCOPY (EGD);  Surgeon: [name], MD;  Location: [facility] ENDOSCOPY;  Service: Endoscopy;  Laterality: N/A;  . Laparotomy N/A 02/12/2013    Procedure: EXPLORATORY LAPAROTOMY PARTIAL COLECTOMY WITH COLOSTOMY;  Surgeon: [name], MD;  Location: [facility];  Service: General;  Laterality: N/A;  . Laparotomy N/A 02/18/2013    Procedure: EXPLORATORY LAPAROTOMY/Closure of Wound;  Surgeon: [name], MD;  Location: [facility] OR;  Service: General;  Laterality: N/A;   Family History  Problem Relation Age of Onset  . Hypertension Father   . Heart disease Father   . Diabetes Father   . Hypertension Mother    Social History:  Married. Disabled due to cardiac issues. Does not have primary care physician. He reports that he quit smoking about 4 years ago. His smoking use included Cigarettes. He smoked 0.00 packs per day. He has never used smokeless tobacco. He reports that he does not drink alcohol or use illicit drugs.   Allergies: No Known  Allergies   Medications Prior to Admission  Medication Sig Dispense Refill  . aspirin EC 325 MG tablet Take 325 mg by mouth daily.      . carvedilol (COREG) 25 MG tablet Take 25 mg by mouth daily.      . clopidogrel (PLAVIX) 75 MG tablet Take 75 mg by mouth daily with breakfast.      . diltiazem (CARDIZEM CD) 240 MG 24 hr capsule Take 240 mg by mouth daily.      . furosemide (LASIX) 40 MG tablet Take 40  mg by mouth 2 (two) times daily.      [name] Kitchen levalbuterol (XOPENEX HFA) 45 MCG/ACT inhaler Inhale 1-2 puffs into the lungs every 4 (four) hours as needed for wheezing.      [name] Kitchen lisinopril (PRINIVIL,ZESTRIL) 20 MG tablet Take 20 mg by mouth daily.      . nitroGLYCERIN (NITROSTAT) 0.4 MG SL tablet Place 0.4 mg under the tongue every 5 (five) minutes as needed for chest pain.      . potassium chloride SA (K-DUR,KLOR-CON) 20 MEQ tablet Take 20 mEq by mouth daily.      . rosuvastatin (CRESTOR) 40 MG tablet Take 40 mg by mouth daily.        Home: Home Living Family/patient expects to be discharged to:: Inpatient rehab Living Arrangements: Spouse/significant other Available Help at Discharge: Family;Available 24 hours/day Type of Home: House Home Access: Level entry Home Layout: Two level;Able to live on main level with bedroom/bathroom Home Equipment: Kasandra Knudsen - single point;Shower seat;Bedside commode;Walker - 2 wheels Additional Comments: pt was independent with all ADL's; has tub shower and reports he was able to stand and take shower; standard height commode   Functional History: Prior Function Comments: pt drives and is very independent; has been on disability since 2010  Functional Status:  Mobility: Bed Mobility Bed Mobility: Supine to Sit;Sitting - Scoot to Edge of Bed Rolling Left: 3: Mod assist Left Sidelying to Sit: 2: Max assist Supine to Sit: 4: Min assist;With rails Sitting - Scoot to [employer] of Bed: 4: Min guard Sit to Supine: Not Tested (comment) Transfers Transfers: Sit to Stand;Stand to Sit Sit to Stand: 1: +2 Total assist;From elevated surface;With upper extremity assist;From bed Sit to Stand: Patient Percentage: 70% Stand to Sit: 3: Mod assist;With upper extremity assist;With armrests;To chair/3-in-1 Ambulation/Gait Ambulation/Gait Assistance: 4: Min assist (+ 1 to follow with chair) Ambulation Distance (Feet): 150 Feet Assistive device: Rolling walker Ambulation/Gait  Assistance Details: Slightly unsteady gait needing incr support initially.  Cues needed for sequencing and safety.   Gait Pattern: Step-through pattern;Decreased stride length;Trunk flexed Gait velocity: slow Stairs: No Wheelchair Mobility Wheelchair Mobility: No  ADL: ADL Grooming: Set up Where Assessed - Grooming: Supported sitting Upper Body Bathing: Minimal assistance Where Assessed - Upper Body Bathing: Supported sitting Lower Body Bathing: Maximal assistance Where Assessed - Lower Body Bathing: Supported sit to stand Upper Body Dressing: Minimal assistance Where Assessed - Upper Body Dressing: Supported sitting Lower Body Dressing: Maximal assistance Where Assessed - Lower Body Dressing: Supported sit to Lobbyist: Moderate assistance Equipment Used: Gait belt;Rolling walker Transfers/Ambulation Related to ADLs: Pt using momentum to complete sit - stand. Pt required 5 trials before being able to stand ADL Comments: easily fatigues. Max A with LB ADL. Will benefit from AE  Cognition: Cognition Overall Cognitive Status: Within Functional Limits for tasks assessed Orientation Level: Oriented X4 Cognition Arousal/Alertness: Awake/alert Behavior During Therapy: WFL for tasks assessed/performed Overall Cognitive Status: Within Functional  Limits for tasks assessed  Physical Exam: Blood pressure 135/83, pulse 92, temperature 98 F (36.7 C), temperature source Oral, resp. rate 18, height 5' 10.5" (1.791 m), weight 126 kg (277 lb 12.5 oz), SpO2 98.00%. Physical Exam  Nursing note and vitals reviewed. Constitutional: He is oriented to person, place, and time. He appears well-developed and well-nourished.  obese  HENT:  Head: Normocephalic and atraumatic.  Right Ear: External ear normal.  Left Ear: External ear normal.  Eyes: Pupils are equal, round, and reactive to light.  Neck: Normal range of motion. Neck supple. No JVD present. No tracheal deviation present. No  thyromegaly present.  Cardiovascular: Normal rate and regular rhythm.  Exam reveals no friction rub.   No murmur heard. Respiratory: Effort normal and breath sounds normal. No respiratory distress. He has no wheezes.  GI: Bowel sounds are normal. He exhibits distension. There is tenderness.  Ostomy in place, sealed. Normal appearing stool. Vacuum dressing in place over midline measuring about 4-5 inches in length and 1 inch in width    Musculoskeletal:  Scaly plaques on bilateral knees. Bilateral feet with dry and large flaky plaques. Left heel with hard thick callus. Bilateral feet with big onychomycotic toe nails.   Lymphadenopathy:    He has no cervical adenopathy.  Neurological: He is alert and oriented to person, place, and time.  UE are 4+/5.  LE are 2 to 2+ prox to 4/5 distally.  No gross sensory deficits  Skin: Skin is warm and dry.  Psychiatric:  A little flat but generally quite appropriate    Results for orders placed during the hospital encounter of 02/02/13 (from the past 48 hour(s))  CBC     Status: Abnormal   Collection Time    02/28/13  5:10 AM      Result Value Range   WBC 5.4  4.0 - 10.5 K/uL   RBC 3.15 (*) 4.22 - 5.81 MIL/uL   Hemoglobin 8.8 (*) 13.0 - 17.0 g/dL   HCT 28.6 (*) 39.0 - 52.0 %   MCV 90.8  78.0 - 100.0 fL   MCH 27.9  26.0 - 34.0 pg   MCHC 30.8  30.0 - 36.0 g/dL   RDW 18.1 (*) 11.5 - 15.5 %   Platelets 247  150 - 400 K/uL  MAGNESIUM     Status: None   Collection Time    03/01/13  4:00 AM      Result Value Range   Magnesium 1.7  1.5 - 2.5 mg/dL  BASIC METABOLIC PANEL     Status: Abnormal   Collection Time    03/01/13  4:00 AM      Result Value Range   Sodium 144  137 - 147 mEq/L   Potassium 4.0  3.7 - 5.3 mEq/L   Chloride 106  96 - 112 mEq/L   CO2 27  19 - 32 mEq/L   Glucose, Bld 90  70 - 99 mg/dL   BUN 4 (*) 6 - 23 mg/dL   Creatinine, Ser 1.13  0.50 - 1.35 mg/dL   Calcium 9.6  8.4 - 10.5 mg/dL   GFR calc non Af Amer 69 (*) >90 mL/min    GFR calc Af Amer 80 (*) >90 mL/min   Comment: (NOTE)     The eGFR has been calculated using the CKD EPI equation.     This calculation has not been validated in all clinical situations.     eGFR's persistently <90 mL/min signify possible Chronic Kidney  Disease.   No results found.  Post Admission Physician Evaluation: 1. Functional deficits secondary  to deconditioning due colon cancer and subsequent colectomy. 2. Patient is admitted to receive collaborative, interdisciplinary care between the physiatrist, rehab nursing staff, and therapy team. 3. Patient's level of medical complexity and substantial therapy needs in context of that medical necessity cannot be provided at a lesser intensity of care such as a SNF. 4. Patient has experienced substantial functional loss from his/her baseline which was documented above under the "Functional History" and "Functional Status" headings.  Judging by the patient's diagnosis, physical exam, and functional history, the patient has potential for functional progress which will result in measurable gains while on inpatient rehab.  These gains will be of substantial and practical use upon discharge  in facilitating mobility and self-care at the household level. 5. Physiatrist will provide 24 hour management of medical needs as well as oversight of the therapy plan/treatment and provide guidance as appropriate regarding the interaction of the two. 6. 24 hour rehab nursing will assist with bladder management, bowel management, safety, skin/wound care, disease management, medication administration, pain management and patient education  and help integrate therapy concepts, techniques,education, etc. 7. PT will assess and treat for/with: Lower extremity strength, range of motion, stamina, balance, functional mobility, safety, adaptive techniques and equipment, pain mgt, pt/family education, egosupport.   Goals are: mod I. 8. OT will assess and treat for/with:  ADL's, functional mobility, safety, upper extremity strength, adaptive techniques and equipment, pain mgt, pt/family education, ego support.   Goals are: mod I to set up. 9. SLP will assess and treat for/with: n/a.  Goals are: n/a. 10. Case Management and Social Worker will assess and treat for psychological issues and discharge planning. 11. Team conference will be held weekly to assess progress toward goals and to determine barriers to discharge. 12. Patient will receive at least 3 hours of therapy per day at least 5 days per week. 13. ELOS: 7-10 days       14. Prognosis:  excellent   Medical Problem List and Plan: 1. DVT Prophylaxis/Anticoagulation: Mechanical:  Antiembolism stockings, knee (TED hose) Bilateral lower extremities Sequential compression devices, below knee Bilateral lower extremities 2. Pain Management: Prn medications effective for abdominal pain.  3. Mood:  Provide ego support. LCSW to follow for evaluation and support.  4. Neuropsych: This patient is capable of making decisions on his  own behalf. 5. Torsades/ A Fib: Continue coreg bid with Kdur and Mag ox as supplement for electrolyte abnormality. Recommendations to keep Mg > 2.0.  Recheck lytes Monday.  6. Acute on chronic CHF:  Continue aldactone and coreg. Monitor daily weights.  7. Colon cancer s/p colon RSXN:   - Add protein supplement to help promote wound healing.   -Reports poor appetite--dietary consult to help with food choices.   -ostomy care/mgt education 8. Wound care: continue with current vac  -may be able to remove soon and initiate standard wet to dry dressing as it is closing  Meredith Staggers, MD, [place] [facility]   03/01/2013

## 2013-03-01 NOTE — Interval H&P Note (Signed)
Larry [name] was admitted today to Inpatient Rehabilitation with the diagnosis of deconditioning . The patient's history has been reviewed, patient examined, and there is no change in status.  Patient continues to be appropriate for intensive inpatient rehabilitation.  I have reviewed the patient's chart and labs.  Questions were answered to the patient's satisfaction.  Bray Vickerman T 03/01/2013, 7:17 PM

## 2013-03-01 NOTE — Discharge Summary (Signed)
Physician Discharge Summary  [name] [id number] DOB: Jul 17, 1950 DOA: 02/02/2013  PCP: No primary provider on file.  Admit date: 02/02/2013 Discharge date: 03/01/2013  Time spent: 45 minutes  Recommendations for Outpatient Follow-up:  -For CIR today.   Discharge Diagnoses:  Principal Problem:   HYPERLIPIDEMIA Active Problems:   OBESITY   HYPERTENSION   CAD   CARDIOMYOPATHY, ISCHEMIC   ATRIAL TACHYCARDIA   C O P D   SLEEP APNEA, OBSTRUCTIVE   Acute renal failure   Hypotension   Chronic blood loss anemia   Atrial fibrillation   Colon cancer   Acute on chronic diastolic heart failure   Torsades de pointes   Discharge Condition: Stable and improved  Filed Weights   02/24/13 0435 02/26/13 1800 02/27/13 0433  Weight: 135.2 kg (298 lb 1 oz) 125.42 kg (276 lb 8 oz) 126 kg (277 lb 12.5 oz)    History of present illness:  Larry [name] is a 63 y.o. male who presents to the ED after 4 weeks of generalized weakness. His family member has been trying to convince him to get checked out during that time period but he kept refusing saying he just "had a cold". He apparently had been sleeping up to 22 hours a day. Decreased po intake, gets out of bed only to go to bathroom and immediately back to bed after.  In the ED patient found to have creatinine 7.8 (baseline 1.3), he was also hypotensive.  The patient was admitted 12/13 with 4 weeks of generalized weakness, found to be hypotensive, with an AKI (Cr = 7.8) and anemia (Hb = 7.1). Cr improved with IV fluids, though anemia required a total of 4 units of pRBC's. On 12/19, colonoscopy showed an obstructing mass in the transverse colon (EGD the same day showed some antral erosions, which were not actively bleeding). Follow-up CT abd showed some low attenuation areas in the liver concerning for mets. The patient was taken to the OR 12/23 for ex lap partial colectomy with coloscopy, with pathology later confirming  adenocarcinoma. 18 lymph nodes were resected, but all were benign. Recovery was complicated by wound dehissance, requiring a return to the OR 12/29. After the procedure, the patient developed atrial tachycardia, so a dilt drip was started (12/29-1/2). On 1/4, the patient had a cardiac arrest, requiring defibrillation, and was subsequently transferred to the ICU. QT was found to be > 700, with mild hypokalemia and hypomagnesemia implicated as the cause. Lidocaine drip was started. By 1/6, QTc had decreased to 499, lidocaine drip was d/c'ed, and patient was transferred out of ICU to stepdown. [facility] resumed care of the pt on 1/07.    Hospital Course:   Colonic adenocarcinoma  S/p resection - ostomy in place - Gen Surgery following  Will need oncology follow up, but still too deconditioned to consider treatment.   Cardiac arrest 1/04 (Torsades)  in the setting of long QT (Q000111Q ms) + metabolic derangements - Cardiology is following - keep Mg 2.0.   Acute on chronic combined systolic and diastolic CHF (EF Q000111Q via TTE 1/04)  Well compensated at present   NSVT with Prolonged QT  Cardiology managing - was on lidocaine drip earlier in hospital stay   Atrial tachycardia / A fib  As per Cardiology - has not recurred of late   Acute kidney injury  Resolved   COPD  Well compensated at present   Anemia  combination of anemia of chronic disease and blood loss from antral ulcers/colon  cancer - has received 5 units of PRBCs  Hb has since remained stable at around 8.6-8.8.   Consultations:  Surgery  Cardiology  PCCM  Discharge Instructions   Future Appointments Provider Department Dept Phone   03/02/2013 8:00 AM Merrilee Seashore, [place] A [phone]   Joint Appt [place] A [id number]   03/02/2013 9:30 AM Tomasita Crumble, [place] A [phone]   03/02/2013  11:30 AM Merrilee Seashore, [place] A [phone]   03/02/2013 1:45 PM Tomasita Crumble, [place] A [phone]   03/03/2013 11:15 AM Dub Amis, [place] A [phone]   03/04/2013 9:00 AM Harlene Ramus, [place] A [phone]   03/04/2013 11:00 AM [place] [facility] [phone]   03/04/2013 1:00 PM Frederic Jericho, [place] A [phone]   03/04/2013 1:45 PM Estelle June, [place] A [phone]   06/18/2013 10:30 AM Burtis Junes, NP [place] Office [phone]       Medication List    ASK your doctor about these medications       aspirin EC 325 MG tablet  Take 325 mg by mouth daily.     carvedilol 25 MG tablet  Commonly known as:  COREG  Take 25 mg by mouth daily.     clopidogrel 75 MG tablet  Commonly known as:  PLAVIX  Take 75 mg by mouth daily with breakfast.     diltiazem 240 MG 24 hr capsule  Commonly known as:  CARDIZEM CD  Take 240 mg by mouth daily.     furosemide 40 MG tablet  Commonly known as:  LASIX  Take 40 mg by mouth 2 (two) times daily.     levalbuterol 45 MCG/ACT inhaler  Commonly known as:  XOPENEX HFA  Inhale 1-2 puffs into the lungs every 4 (four) hours as needed for wheezing.     lisinopril 20 MG tablet  Commonly known as:  PRINIVIL,ZESTRIL  Take 20 mg by mouth daily.     nitroGLYCERIN 0.4 MG SL tablet  Commonly known as:  NITROSTAT  Place 0.4 mg under the tongue every 5 (five) minutes as needed for chest pain.     potassium chloride SA 20 MEQ tablet  Commonly known as:  K-DUR,KLOR-CON  Take 20 mEq by mouth daily.     rosuvastatin 40 MG tablet  Commonly known as:  CRESTOR  Take 40 mg by mouth daily.       No Known  Allergies    The results of significant diagnostics from this hospitalization (including imaging, microbiology, ancillary and laboratory) are listed below for reference.    Significant Diagnostic Studies: Ct Chest W Contrast  02/08/2013   CLINICAL DATA:  Mass in transverse colon  EXAM: CT CHEST, ABDOMEN, AND PELVIS WITH CONTRAST  TECHNIQUE: Multidetector CT imaging of the chest, abdomen and pelvis was performed following the standard protocol during bolus administration of intravenous contrast.  CONTRAST:  159mL OMNIPAQUE IOHEXOL 300 MG/ML  SOLN  COMPARISON:  100 cc of Omni 300  FINDINGS: CT CHEST FINDINGS  There is no pleural effusion identified. Subpleural nodule within  the anterior right upper lobe measures 5 mm, image 33/series 3. Subpleural scar is identified in the right upper lobe, image 28/series 3.  The trachea appears patent and midline. The heart size is normal. Calcified atherosclerotic disease affects the thoracic aorta as well as the RCA, LAD and left circumflex coronary artery. No pericardial effusion noted. There is no mediastinal or hilar adenopathy. No axillary or supraclavicular adenopathy identified.  Review of the visualized bony structures is significant for mild multilevel thoracic spondylosis.  CT ABDOMEN AND PELVIS FINDINGS  There are several small, indeterminate low attenuation structures within the liver parenchyma. These are too small to characterize. Index hypodensity within the right hepatic lobe measures 6 mm, image number 58/series 2. Near the dome of liver there is a 5 mm hypodensity, image 51/series 2. Posterior right hepatic lobe hypodensity measures 5 mm, image 62/series 2. The gallbladder appears normal. There is no biliary dilatation. Normal appearance of the pancreas. The spleen is unremarkable.  Normal appearance of the adrenal glands. Left renal cyst identified. Bilateral renal hypodensities are noted likely representing cysts. 3.8 cm diverticula arises from the  right posterior bladder wall. Calcifications within the prostate gland are identified.  Calcified atherosclerotic disease affects the abdominal aorta. There is no aneurysm. No retroperitoneal adenopathy identified. No pelvic or inguinal adenopathy noted.  Normal appearance of the stomach. The small bowel loops are within normal limits circumferential mass involving the mid transverse colon is identified, image number 82/series 2. This measures approximately 6 cm in length. There is increased caliber of the proximal large bowel suggesting partial obstruction. No ascites or peritoneal nodularity identified.  Review of the visualized osseous structures is significant for mild lumbar spondylosis. No aggressive lytic or sclerotic bone lesions identified.  IMPRESSION: CT chest:  1. Circumferential mass involving the mid transverse colon is identified compatible with colon carcinoma. 2. Subpleural nodule in the right upper lobe measures 5 mm. 3. Several low-attenuation foci are identified within the liver parenchyma. These are too small to reliably characterize. In a patient with a new diagnosis of colon cancer I would suggest more definitive characterization with contrast enhanced MRI of the liver.   Electronically Signed   By: Kerby Moors M.D.   On: 02/08/2013 14:46   Ct Abdomen Pelvis W Contrast  02/08/2013   CLINICAL DATA:  Mass in transverse colon  EXAM: CT CHEST, ABDOMEN, AND PELVIS WITH CONTRAST  TECHNIQUE: Multidetector CT imaging of the chest, abdomen and pelvis was performed following the standard protocol during bolus administration of intravenous contrast.  CONTRAST:  164mL OMNIPAQUE IOHEXOL 300 MG/ML  SOLN  COMPARISON:  100 cc of Omni 300  FINDINGS: CT CHEST FINDINGS  There is no pleural effusion identified. Subpleural nodule within the anterior right upper lobe measures 5 mm, image 33/series 3. Subpleural scar is identified in the right upper lobe, image 28/series 3.  The trachea appears patent and  midline. The heart size is normal. Calcified atherosclerotic disease affects the thoracic aorta as well as the RCA, LAD and left circumflex coronary artery. No pericardial effusion noted. There is no mediastinal or hilar adenopathy. No axillary or supraclavicular adenopathy identified.  Review of the visualized bony structures is significant for mild multilevel thoracic spondylosis.  CT ABDOMEN AND PELVIS FINDINGS  There are several small, indeterminate low attenuation structures within the liver parenchyma. These are too small to characterize. Index hypodensity within the right hepatic lobe measures 6 mm, image number 58/series 2. Near the dome of liver there is a 5  mm hypodensity, image 51/series 2. Posterior right hepatic lobe hypodensity measures 5 mm, image 62/series 2. The gallbladder appears normal. There is no biliary dilatation. Normal appearance of the pancreas. The spleen is unremarkable.  Normal appearance of the adrenal glands. Left renal cyst identified. Bilateral renal hypodensities are noted likely representing cysts. 3.8 cm diverticula arises from the right posterior bladder wall. Calcifications within the prostate gland are identified.  Calcified atherosclerotic disease affects the abdominal aorta. There is no aneurysm. No retroperitoneal adenopathy identified. No pelvic or inguinal adenopathy noted.  Normal appearance of the stomach. The small bowel loops are within normal limits circumferential mass involving the mid transverse colon is identified, image number 82/series 2. This measures approximately 6 cm in length. There is increased caliber of the proximal large bowel suggesting partial obstruction. No ascites or peritoneal nodularity identified.  Review of the visualized osseous structures is significant for mild lumbar spondylosis. No aggressive lytic or sclerotic bone lesions identified.  IMPRESSION: CT chest:  1. Circumferential mass involving the mid transverse colon is identified  compatible with colon carcinoma. 2. Subpleural nodule in the right upper lobe measures 5 mm. 3. Several low-attenuation foci are identified within the liver parenchyma. These are too small to reliably characterize. In a patient with a new diagnosis of colon cancer I would suggest more definitive characterization with contrast enhanced MRI of the liver.   Electronically Signed   By: Kerby Moors M.D.   On: 02/08/2013 14:46   US Renal  02/02/2013   CLINICAL DATA:  Renal failure  EXAM: RENAL/URINARY TRACT ULTRASOUND COMPLETE  COMPARISON:  None.  FINDINGS: Right Kidney:  Length: 11.6 cm. Echogenic renal parenchyma, suggesting medical renal disease. 1.4 x 1.1 x 1.2 cm upper pole cyst. No hydronephrosis.  Left Kidney:  Length: 11.7 cm. Echogenic renal parenchyma, suggesting medical renal disease. 2.1 x 2.0 x 2.0 cm upper pole cyst. No hydronephrosis.  Bladder:  Decompressed by indwelling Foley catheter.  IMPRESSION: Echogenic renal parenchyma, suggesting medical renal disease.  Bilateral renal cysts.  No hydronephrosis.   Electronically Signed   By: Julian Hy M.D.   On: 02/02/2013 20:59   Mr Liver W Contrast  02/14/2013   CLINICAL DATA:  Colonic mass, presumably colon cancer, at prior imaging, liver masses on prior CT  EXAM: MRI ABDOMEN WITHOUT CONTRAST  TECHNIQUE: Multiplanar multisequence MR imaging of the abdomen was performed after the administration of intravenous contrast.  CONTRAST:  10 cc Eovist IV contrast  COMPARISON:  CT 02/08/2013  FINDINGS: Small bilateral pleural effusions are identified. Evidence of midline abdominal incision and diverting right lower quadrant colostomy. Motion artifact is present on multiple series. This essentially obscures detail of the previously described sub cm hepatic lesions, although no enhancement is identified at the largest of these masses measuring 6 mm within the right hepatic lobe, for example image 35 series 503 allowing for motion. On T2 weighted images,  the previously seen sub cm hypodense lesions in the right hepatic lobe appear T2 hyperintense. The dome of medial segment left hepatic lobe 3 mm lesion appears T2 hyperintense but is too small for definitive characterization on postcontrast imaging allowing for motion.  Adrenal glands, spleen, pancreas, and gallbladder are normal. No ascites. Bilateral renal cortical cysts are noted.  IMPRESSION: Hepatic T2 hyperintense lesions are identified most compatible with biliary cysts or hamartomas. Because of patient motion, only the largest of these measuring 6 mm in the posterior segment right hepatic lobe can be relatively confidently identified as lacking internal  contrast enhancement. The others are too small for postcontrast evaluation due to patient motion. Further workup is as per the overall patient's periodic restaging plan.  Bilateral small pleural effusions.  Interval diverting right lower quadrant colostomy.   Electronically Signed   By: Conchita Paris M.D.   On: 02/14/2013 09:09   Dg Chest Port 1 View  02/19/2013   CLINICAL DATA:  Cough, congestion  EXAM: PORTABLE CHEST - 1 VIEW  COMPARISON:  02/18/2013  FINDINGS: The cardiac silhouette is enlarged. Low lung volumes. The lungs are clear. An NG tube is seen tip projecting in the expected region of the stomach, fundal region. The osseous structures are unremarkable.  IMPRESSION: Low lung volumes, cardiomegaly, no evidence of acute cardiopulmonary disease.   Electronically Signed   By: Margaree Mackintosh M.D.   On: 02/19/2013 08:39   Dg Chest Port 1 View  02/18/2013   CLINICAL DATA:  Postop congestion.  EXAM: PORTABLE CHEST - 1 VIEW  COMPARISON:  02/08/2013 CT chest.  02/03/2012 chest x-ray.  FINDINGS: Cardiomegaly.  Pulmonary vascular congestion most notable centrally.  Question basilar subsegmental atelectasis.  CT detected subpleural nodule right upper lobe not evaluated on present exam.  IMPRESSION: Cardiomegaly.  Pulmonary vascular congestion most  notable centrally.  Question basilar subsegmental atelectasis.  CT detected subpleural nodule right upper lobe not evaluated on present exam.   Electronically Signed   By: Chauncey Cruel M.D.   On: 02/18/2013 13:13   Dg Chest Portable 1 View  02/02/2013   CLINICAL DATA:  Weakness, presyncope  EXAM: PORTABLE CHEST - 1 VIEW  COMPARISON:  06/16/2011  FINDINGS: Mildly low lung volumes. There is mild elevation the right hemidiaphragm. Cardiac silhouette is within normal limits. There is no evidence of focal infiltrates, effusions, nor edema. Dextroscoliosis identified within the thoracic spine.  IMPRESSION: No evidence of acute cardiopulmonary disease.   Electronically Signed   By: Margaree Mackintosh M.D.   On: 02/02/2013 17:34   Dg Abd Portable 1v  02/23/2013   CLINICAL DATA:  Lower abdominal pain  EXAM: PORTABLE ABDOMEN - 1 VIEW  COMPARISON:  MR ABDOMEN W/ CM dated 02/13/2013; CT CHEST W/CM dated 02/08/2013  FINDINGS: Small amount residual oral contrast within the colon from CT of 02/09/2003. There are mildly dilated loops of small bowel in the left lower quadrant measuring up to 4 cm. . No evidence of intraperitoneal free air on this supine exam.  IMPRESSION: 1. Dilated loops of small bowel in the left lower quadrant consistent with focal ileus or partial obstruction.  2. No evidence of high-grade obstruction. There is contrast within the rectum from CT of 02/08/2013.   Electronically Signed   By: Suzy Bouchard M.D.   On: 02/23/2013 12:38    Microbiology: Recent Results (from the past 240 hour(s))  CULTURE, EXPECTORATED SPUTUM-ASSESSMENT     Status: None   Collection Time    02/20/13  6:18 PM      Result Value Range Status   Specimen Description SPUTUM   Final   Special Requests NONE   Final   Sputum evaluation     Final   Value: THIS SPECIMEN IS ACCEPTABLE. RESPIRATORY CULTURE REPORT TO FOLLOW.   Report Status 02/20/2013 FINAL   Final  CULTURE, RESPIRATORY (NON-EXPECTORATED)     Status: None    Collection Time    02/20/13  6:18 PM      Result Value Range Status   Specimen Description SPUTUM   Final   Special Requests NONE  Final   Gram Stain     Final   Value: FEW WBC PRESENT, PREDOMINANTLY PMN     FEW SQUAMOUS EPITHELIAL CELLS PRESENT     FEW GRAM NEGATIVE RODS     FEW GRAM POSITIVE COCCI IN PAIRS     Performed at [facility]   Culture     Final   Value: NORMAL OROPHARYNGEAL FLORA     Performed at [facility]   Report Status 02/23/2013 FINAL   Final  MRSA PCR SCREENING     Status: None   Collection Time    02/24/13  4:34 AM      Result Value Range Status   MRSA by PCR NEGATIVE  NEGATIVE Final   Comment:            The GeneXpert MRSA Assay (FDA     approved for NASAL specimens     only), is one component of a     comprehensive MRSA colonization     surveillance program. It is not     intended to diagnose MRSA     infection nor to guide or     monitor treatment for     MRSA infections.     Labs: Basic Metabolic Panel:  Recent Labs Lab 02/24/13 0415 02/24/13 1038 02/25/13 0429 02/26/13 0500 02/27/13 0236 03/01/13 0400  NA 142 142 143 140 141 144  K 3.4* 4.2 3.5* 3.9 4.1 4.0  CL 107 106 107 104 108 106  CO2 21 24 25 24 24 27   GLUCOSE 126* 91 94 99 115* 90  BUN 3* 3* <3* <3* 3* 4*  CREATININE 1.00 0.99 0.99 1.01 1.16 1.13  CALCIUM 8.7 8.7 8.9 9.1 9.2 9.6  MG 1.7 2.3 2.0  --   --  1.7   Liver Function Tests: No results found for this basename: AST, ALT, ALKPHOS, BILITOT, PROT, ALBUMIN,  in the last 168 hours No results found for this basename: LIPASE, AMYLASE,  in the last 168 hours No results found for this basename: AMMONIA,  in the last 168 hours CBC:  Recent Labs Lab 02/24/13 1038 02/26/13 0500 02/27/13 0236 02/28/13 0510  WBC 6.8 5.1 5.4 5.4  HGB 9.7* 8.8* 8.6* 8.8*  HCT 31.7* 28.8* 27.9* 28.6*  MCV 89.8 90.3 90.9 90.8  PLT 286 264 254 247   Cardiac Enzymes:  Recent Labs Lab 02/24/13 0415 02/24/13 1038  02/24/13 1525 02/24/13 1846  TROPONINI <0.30 <0.30 <0.30 <0.30   BNP: BNP (last 3 results) No results found for this basename: PROBNP,  in the last 8760 hours CBG: No results found for this basename: GLUCAP,  in the last 168 hours     Signed:  Lelon Frohlich  [facility] Pager: [phone] 03/01/2013, 4:09 PM

## 2013-03-01 NOTE — Progress Notes (Signed)
Patient Name: Larry [name] Date of Encounter: 03/01/2013     Principal Problem:   HYPERLIPIDEMIA Active Problems:   OBESITY   HYPERTENSION   CAD   CARDIOMYOPATHY, ISCHEMIC   ATRIAL TACHYCARDIA   C O P D   SLEEP APNEA, OBSTRUCTIVE   Acute renal failure   Hypotension   Chronic blood loss anemia   Atrial fibrillation   Colon cancer   Acute on chronic diastolic heart failure   Torsades de pointes    SUBJECTIVE  The patient denies any chest pain or increased shortness of breath.  [facility]. Telemetry remains stable on current medication.  CURRENT MEDS . aspirin  81 mg Oral Daily  . carvedilol  6.25 mg Oral BID WC  . enoxaparin (LOVENOX) injection  60 mg Subcutaneous Q24H  . ferrous sulfate  325 mg Oral Q breakfast  . pantoprazole  40 mg Oral QHS  . potassium chloride  40 mEq Oral Daily  . spironolactone  25 mg Oral Daily    OBJECTIVE  Filed Vitals:   02/28/13 0450 02/28/13 1326 02/28/13 2000 03/01/13 0500  BP: 141/82 148/87 120/80 149/80  Pulse: 84 95 91 91  Temp: 98.1 F (36.7 C) 98.5 F (36.9 C) 98.1 F (36.7 C) 98 F (36.7 C)  TempSrc: Oral Oral    Resp: 20 18 18 18   Height:      Weight:      SpO2: 97% 97% 97% 99%    Intake/Output Summary (Last 24 hours) at 03/01/13 0757 Last data filed at 03/01/13 0600  Gross per 24 hour  Intake    120 ml  Output   1225 ml  Net  -1105 ml   Filed Weights   02/24/13 0435 02/26/13 1800 02/27/13 0433  Weight: 298 lb 1 oz (135.2 kg) 276 lb 8 oz (125.42 kg) 277 lb 12.5 oz (126 kg)    PHYSICAL EXAM  General: Pleasant, NAD. Neuro: Alert and oriented X 3. Moves all extremities spontaneously. Psych: Normal affect. HEENT:  Normal  Neck: Supple without bruits or JVD. Lungs:  Resp regular and unlabored, CTA. Heart: RRR no s3, s4, or murmurs. Abdomen: Soft, non-tender, dressing intact. Extremities: No clubbing, cyanosis or edema. DP/PT/Radials 2+ and equal  bilaterally.  Accessory Clinical Findings  CBC  Recent Labs  02/27/13 0236 02/28/13 0510  WBC 5.4 5.4  HGB 8.6* 8.8*  HCT 27.9* 28.6*  MCV 90.9 90.8  PLT 254 [id number]   Basic Metabolic Panel  Recent Labs  02/27/13 0236 03/01/13 0400  NA 141 144  K 4.1 4.0  CL 108 106  CO2 24 27  GLUCOSE 115* 90  BUN 3* 4*  CREATININE 1.16 1.13  CALCIUM 9.2 9.6  MG  --  1.7   Liver Function Tests No results found for this basename: AST, ALT, ALKPHOS, BILITOT, PROT, ALBUMIN,  in the last 72 hours No results found for this basename: LIPASE, AMYLASE,  in the last 72 hours Cardiac Enzymes No results found for this basename: CKTOTAL, CKMB, CKMBINDEX, TROPONINI,  in the last 72 hours BNP No components found with this basename: POCBNP,  D-Dimer No results found for this basename: DDIMER,  in the last 72 hours Hemoglobin A1C No results found for this basename: HGBA1C,  in the last 72 hours Fasting Lipid Panel No results found for this basename: CHOL, HDL, LDLCALC, TRIG, CHOLHDL, LDLDIRECT,  in the last 72 hours Thyroid Function Tests No results found for this basename: TSH,  T4TOTAL, FREET3, T3FREE, THYROIDAB,  in the last 72 hours  TELE Normal sinus rhythm.  Occasional PVC  ECG    Radiology/Studies  Ct Chest W Contrast  02/08/2013   CLINICAL DATA:  Mass in transverse colon  EXAM: CT CHEST, ABDOMEN, AND PELVIS WITH CONTRAST  TECHNIQUE: Multidetector CT imaging of the chest, abdomen and pelvis was performed following the standard protocol during bolus administration of intravenous contrast.  CONTRAST:  15mL OMNIPAQUE IOHEXOL 300 MG/ML  SOLN  COMPARISON:  100 cc of Omni 300  FINDINGS: CT CHEST FINDINGS  There is no pleural effusion identified. Subpleural nodule within the anterior right upper lobe measures 5 mm, image 33/series 3. Subpleural scar is identified in the right upper lobe, image 28/series 3.  The trachea appears patent and midline. The heart size is normal. Calcified  atherosclerotic disease affects the thoracic aorta as well as the RCA, LAD and left circumflex coronary artery. No pericardial effusion noted. There is no mediastinal or hilar adenopathy. No axillary or supraclavicular adenopathy identified.  Review of the visualized bony structures is significant for mild multilevel thoracic spondylosis.  CT ABDOMEN AND PELVIS FINDINGS  There are several small, indeterminate low attenuation structures within the liver parenchyma. These are too small to characterize. Index hypodensity within the right hepatic lobe measures 6 mm, image number 58/series 2. Near the dome of liver there is a 5 mm hypodensity, image 51/series 2. Posterior right hepatic lobe hypodensity measures 5 mm, image 62/series 2. The gallbladder appears normal. There is no biliary dilatation. Normal appearance of the pancreas. The spleen is unremarkable.  Normal appearance of the adrenal glands. Left renal cyst identified. Bilateral renal hypodensities are noted likely representing cysts. 3.8 cm diverticula arises from the right posterior bladder wall. Calcifications within the prostate gland are identified.  Calcified atherosclerotic disease affects the abdominal aorta. There is no aneurysm. No retroperitoneal adenopathy identified. No pelvic or inguinal adenopathy noted.  Normal appearance of the stomach. The small bowel loops are within normal limits circumferential mass involving the mid transverse colon is identified, image number 82/series 2. This measures approximately 6 cm in length. There is increased caliber of the proximal large bowel suggesting partial obstruction. No ascites or peritoneal nodularity identified.  Review of the visualized osseous structures is significant for mild lumbar spondylosis. No aggressive lytic or sclerotic bone lesions identified.  IMPRESSION: CT chest:  1. Circumferential mass involving the mid transverse colon is identified compatible with colon carcinoma. 2. Subpleural  nodule in the right upper lobe measures 5 mm. 3. Several low-attenuation foci are identified within the liver parenchyma. These are too small to reliably characterize. In a patient with a new diagnosis of colon cancer I would suggest more definitive characterization with contrast enhanced MRI of the liver.   Electronically Signed   By: Kerby Moors M.D.   On: 02/08/2013 14:46   Ct Abdomen Pelvis W Contrast  02/08/2013   CLINICAL DATA:  Mass in transverse colon  EXAM: CT CHEST, ABDOMEN, AND PELVIS WITH CONTRAST  TECHNIQUE: Multidetector CT imaging of the chest, abdomen and pelvis was performed following the standard protocol during bolus administration of intravenous contrast.  CONTRAST:  126mL OMNIPAQUE IOHEXOL 300 MG/ML  SOLN  COMPARISON:  100 cc of Omni 300  FINDINGS: CT CHEST FINDINGS  There is no pleural effusion identified. Subpleural nodule within the anterior right upper lobe measures 5 mm, image 33/series 3. Subpleural scar is identified in the right upper lobe, image 28/series 3.  The trachea appears  patent and midline. The heart size is normal. Calcified atherosclerotic disease affects the thoracic aorta as well as the RCA, LAD and left circumflex coronary artery. No pericardial effusion noted. There is no mediastinal or hilar adenopathy. No axillary or supraclavicular adenopathy identified.  Review of the visualized bony structures is significant for mild multilevel thoracic spondylosis.  CT ABDOMEN AND PELVIS FINDINGS  There are several small, indeterminate low attenuation structures within the liver parenchyma. These are too small to characterize. Index hypodensity within the right hepatic lobe measures 6 mm, image number 58/series 2. Near the dome of liver there is a 5 mm hypodensity, image 51/series 2. Posterior right hepatic lobe hypodensity measures 5 mm, image 62/series 2. The gallbladder appears normal. There is no biliary dilatation. Normal appearance of the pancreas. The spleen is  unremarkable.  Normal appearance of the adrenal glands. Left renal cyst identified. Bilateral renal hypodensities are noted likely representing cysts. 3.8 cm diverticula arises from the right posterior bladder wall. Calcifications within the prostate gland are identified.  Calcified atherosclerotic disease affects the abdominal aorta. There is no aneurysm. No retroperitoneal adenopathy identified. No pelvic or inguinal adenopathy noted.  Normal appearance of the stomach. The small bowel loops are within normal limits circumferential mass involving the mid transverse colon is identified, image number 82/series 2. This measures approximately 6 cm in length. There is increased caliber of the proximal large bowel suggesting partial obstruction. No ascites or peritoneal nodularity identified.  Review of the visualized osseous structures is significant for mild lumbar spondylosis. No aggressive lytic or sclerotic bone lesions identified.  IMPRESSION: CT chest:  1. Circumferential mass involving the mid transverse colon is identified compatible with colon carcinoma. 2. Subpleural nodule in the right upper lobe measures 5 mm. 3. Several low-attenuation foci are identified within the liver parenchyma. These are too small to reliably characterize. In a patient with a new diagnosis of colon cancer I would suggest more definitive characterization with contrast enhanced MRI of the liver.   Electronically Signed   By: Kerby Moors M.D.   On: 02/08/2013 14:46   US Renal  02/02/2013   CLINICAL DATA:  Renal failure  EXAM: RENAL/URINARY TRACT ULTRASOUND COMPLETE  COMPARISON:  None.  FINDINGS: Right Kidney:  Length: 11.6 cm. Echogenic renal parenchyma, suggesting medical renal disease. 1.4 x 1.1 x 1.2 cm upper pole cyst. No hydronephrosis.  Left Kidney:  Length: 11.7 cm. Echogenic renal parenchyma, suggesting medical renal disease. 2.1 x 2.0 x 2.0 cm upper pole cyst. No hydronephrosis.  Bladder:  Decompressed by indwelling Foley  catheter.  IMPRESSION: Echogenic renal parenchyma, suggesting medical renal disease.  Bilateral renal cysts.  No hydronephrosis.   Electronically Signed   By: Julian Hy M.D.   On: 02/02/2013 20:59   Mr Liver W Contrast  02/14/2013   CLINICAL DATA:  Colonic mass, presumably colon cancer, at prior imaging, liver masses on prior CT  EXAM: MRI ABDOMEN WITHOUT CONTRAST  TECHNIQUE: Multiplanar multisequence MR imaging of the abdomen was performed after the administration of intravenous contrast.  CONTRAST:  10 cc Eovist IV contrast  COMPARISON:  CT 02/08/2013  FINDINGS: Small bilateral pleural effusions are identified. Evidence of midline abdominal incision and diverting right lower quadrant colostomy. Motion artifact is present on multiple series. This essentially obscures detail of the previously described sub cm hepatic lesions, although no enhancement is identified at the largest of these masses measuring 6 mm within the right hepatic lobe, for example image 35 series 503 allowing for motion.  On T2 weighted images, the previously seen sub cm hypodense lesions in the right hepatic lobe appear T2 hyperintense. The dome of medial segment left hepatic lobe 3 mm lesion appears T2 hyperintense but is too small for definitive characterization on postcontrast imaging allowing for motion.  Adrenal glands, spleen, pancreas, and gallbladder are normal. No ascites. Bilateral renal cortical cysts are noted.  IMPRESSION: Hepatic T2 hyperintense lesions are identified most compatible with biliary cysts or hamartomas. Because of patient motion, only the largest of these measuring 6 mm in the posterior segment right hepatic lobe can be relatively confidently identified as lacking internal contrast enhancement. The others are too small for postcontrast evaluation due to patient motion. Further workup is as per the overall patient's periodic restaging plan.  Bilateral small pleural effusions.  Interval diverting right lower  quadrant colostomy.   Electronically Signed   By: Conchita Paris M.D.   On: 02/14/2013 09:09   Dg Chest Port 1 View  02/19/2013   CLINICAL DATA:  Cough, congestion  EXAM: PORTABLE CHEST - 1 VIEW  COMPARISON:  02/18/2013  FINDINGS: The cardiac silhouette is enlarged. Low lung volumes. The lungs are clear. An NG tube is seen tip projecting in the expected region of the stomach, fundal region. The osseous structures are unremarkable.  IMPRESSION: Low lung volumes, cardiomegaly, no evidence of acute cardiopulmonary disease.   Electronically Signed   By: Margaree Mackintosh M.D.   On: 02/19/2013 08:39   Dg Chest Port 1 View  02/18/2013   CLINICAL DATA:  Postop congestion.  EXAM: PORTABLE CHEST - 1 VIEW  COMPARISON:  02/08/2013 CT chest.  02/03/2012 chest x-ray.  FINDINGS: Cardiomegaly.  Pulmonary vascular congestion most notable centrally.  Question basilar subsegmental atelectasis.  CT detected subpleural nodule right upper lobe not evaluated on present exam.  IMPRESSION: Cardiomegaly.  Pulmonary vascular congestion most notable centrally.  Question basilar subsegmental atelectasis.  CT detected subpleural nodule right upper lobe not evaluated on present exam.   Electronically Signed   By: Chauncey Cruel M.D.   On: 02/18/2013 13:13   Dg Chest Portable 1 View  02/02/2013   CLINICAL DATA:  Weakness, presyncope  EXAM: PORTABLE CHEST - 1 VIEW  COMPARISON:  06/16/2011  FINDINGS: Mildly low lung volumes. There is mild elevation the right hemidiaphragm. Cardiac silhouette is within normal limits. There is no evidence of focal infiltrates, effusions, nor edema. Dextroscoliosis identified within the thoracic spine.  IMPRESSION: No evidence of acute cardiopulmonary disease.   Electronically Signed   By: Margaree Mackintosh M.D.   On: 02/02/2013 17:34   Dg Abd Portable 1v  02/23/2013   CLINICAL DATA:  Lower abdominal pain  EXAM: PORTABLE ABDOMEN - 1 VIEW  COMPARISON:  MR ABDOMEN W/ CM dated 02/13/2013; CT CHEST W/CM dated  02/08/2013  FINDINGS: Small amount residual oral contrast within the colon from CT of 02/09/2003. There are mildly dilated loops of small bowel in the left lower quadrant measuring up to 4 cm. . No evidence of intraperitoneal free air on this supine exam.  IMPRESSION: 1. Dilated loops of small bowel in the left lower quadrant consistent with focal ileus or partial obstruction.  2. No evidence of high-grade obstruction. There is contrast within the rectum from CT of 02/08/2013.   Electronically Signed   By: Suzy Bouchard M.D.   On: 02/23/2013 12:38    ASSESSMENT AND PLAN 63 yo male with hx CAD/CHF was admitted 12/13 with ARF, hypotension, anemia. Colon mass -> cards consult pre-op, surgery  12/23, wound probs w/surg 12/29, Torsades 01/04 req shock x 1 -> afib, K+ 3.5, Mg 1.7. Torsades again early 01/05, shock into SR. SR since then. Echo 01/04, EF 40-45%.   Torsades de pointes - in the setting of long QT [id number] ms, metabolic derangements. No indication of ischemia. Had been off meds due to surgery. Coreg, spironolactone started 01/06. Continue to follow labs, plan is to keep serum magnesium above 2.  It was 1.7 today.  Will supplement.  Atrial fibrillation - no recurrence, on BB.  No chronic anticoagulation unless recurrent atrial fib in view of his history of chronic blood loss anemia.  Acute on chronic diastolic heart failure - Volume status OK now, continue to follow I/O, daily weights.  Weight stable Hypotension - resolved  From a cardiac standpoint okay to move to [facility].  Signed, Darlin Coco MD

## 2013-03-01 NOTE — PMR Pre-admission (Signed)
PMR Admission Coordinator Pre-Admission Assessment  Patient: Larry [name] is an 63 y.o., male MRN: [id number] DOB: Dec 15, 1950 Height: 5' 10.5" (179.1 cm) Weight: 126 kg (277 lb 12.5 oz)              Insurance Information HMO:     PPO:      PCP:      IPA:      80/20:      OTHER:  PRIMARY: Medicare Part A and B      Policy#: [id number]     Subscriber: self CM Name:       Phone#:      Fax#:  Pre-Cert#:       Employer:  Benefits:  Phone #:      Name: checked [employer] Date: 08-21-12     Deduct: $1260      Out of Pocket Max:  none     Life Max: unlimited CIR: 100%      SNF: 100% days 1-20, 80% days 21-100   Visit limits: 100 days Outpatient: 80%     Co-Pay: 20% Home Health: 100%  no visit limits      Co-Pay: no DME: 80%     Co-Pay: 20% Providers: pt's choice  SECONDARY: Larry [name]      Policy#: [id number]      Subscriber: Larry [name] is retired CM Name:       Phone#: [phone]     Fax#:  Pre-Cert#:       Employer:  Benefits:  Phone #:      Name:  Eff. Date:      Deduct:       Out of Pocket Max:       Life Max:  CIR:       SNF:  Outpatient:      Co-Pay:  Home Health:       Co-Pay:  DME:      Co-Pay:    Emergency Contact Information Contact Information   Name Relation Home Work Mobile   [place] Spouse [phone]  [phone]     Current Medical History  Patient Admitting Diagnosis: Deconditioning related to colon CA s/p colectomy  History of Present Illness:  a 63 y.o. male with history of HTN, NSVT, COPD, admitted 02/02/13 with 5 week history of progressive weakness, constipation x 2 weeks and difficulty eating or drinking. He was found to have acute renal failure with Cr -7.83, anemia 7.3 as well as obstructing colon mass. Colonoscopy with biopsy revealed adenocarcinoma and patient underwent Exp lap with partial colectomy with colostomy on 12/23 by Dr. Hulen Skains. Abdominal wound closure with VAC placement on 02/18/13.  On 02/24/13, patient with V fib  cardiac arrest and CPR initiated and patient shocked with subsequent  A fib with RVR.   Electrolyte abnormality treated and 2D echo with EF 40-45% with periapical akinesis and biatrial enlargement. Patient with recurrent torsades on 01/05 and shocked in NSR. Wound healing well and will not need VAC per CCS. Appetite slowly improving but patient deconditioned. MD, PT,OT recommending CIR.   Past Medical History  Past Medical History  Diagnosis Date  . Hypertension   . Hypercholesteremia   . CHF (congestive heart failure)     has diastolic heart failure grade 1; EF is 45 to 50% per echo 05/2011  . Coronary artery disease   . COPD (chronic obstructive pulmonary disease)   . Asthma   . Obesity   . Noncompliance   . NSVT (nonsustained ventricular tachycardia)  beta blocker restarted  . Atrial tachycardia     managed on beta blocker therapy    Family History  family history includes Diabetes in his father; Heart disease in his father; Hypertension in his father and mother.  Prior Rehab/Hospitalizations: none   Current Medications  Current facility-administered medications:0.9 %  sodium chloride infusion, , Intravenous, Continuous, Juanito Doom, MD;  acetaminophen (TYLENOL) suppository 650 mg, 650 mg, Rectal, Q6H PRN, Maryann Mikhail, DO;  acetaminophen (TYLENOL) tablet 650 mg, 650 mg, Oral, Q6H PRN, Cherene Altes, MD;  aspirin chewable tablet 81 mg, 81 mg, Oral, Daily, Larey Dresser, MD, 81 mg at 03/01/13 1336 carvedilol (COREG) tablet 6.25 mg, 6.25 mg, Oral, BID WC, Rhonda G Barrett, PA-C, 6.25 mg at 03/01/13 1026;  enoxaparin (LOVENOX) injection 60 mg, 60 mg, Subcutaneous, Q24H, Maryann Mikhail, DO, 60 mg at 03/01/13 1026;  ferrous sulfate tablet 325 mg, 325 mg, Oral, Q breakfast, Hester Mates, MD, 325 mg at 03/01/13 1026 HYDROcodone-acetaminophen (NORCO/VICODIN) 5-325 MG per tablet 1-2 tablet, 1-2 tablet, Oral, Q4H PRN, Coralie Keens, PA-C, 2 tablet at 02/26/13 1005;  HYDROmorphone  (DILAUDID) injection 1 mg, 1 mg, Intravenous, Q4H PRN, [name], MD, 1 mg at 02/27/13 1020;  levalbuterol (XOPENEX HFA) inhaler 1-2 puff, 1-2 puff, Inhalation, Q3H PRN, Cherene Altes, MD, 2 puff at 02/19/13 0555 magnesium oxide (MAG-OX) tablet 400 mg, 400 mg, Oral, Daily, Darlin Coco, MD, 400 mg at 03/01/13 1026;  ondansetron (ZOFRAN) injection 4 mg, 4 mg, Intravenous, Q6H PRN, Maryann Mikhail, DO, 4 mg at 02/22/13 1705;  pantoprazole (PROTONIX) EC tablet 40 mg, 40 mg, Oral, QHS, Eudelia Bunch, RPH, 40 mg at 02/28/13 2221;  potassium chloride 20 MEQ/15ML (10%) liquid 40 mEq, 40 mEq, Oral, Daily, Cherene Altes, MD, 40 mEq at 03/01/13 1026 sodium chloride (OCEAN) 0.65 % nasal spray 1 spray, 1 spray, Each Nare, PRN, Maryann Mikhail, DO;  spironolactone (ALDACTONE) tablet 25 mg, 25 mg, Oral, Daily, Rhonda G Barrett, PA-C, 25 mg at 03/01/13 1026  Patients Current Diet: Bland  Precautions / Restrictions Precautions Precautions: Fall Precaution Comments: colostomy, wound VAC, abdominal binder Restrictions Weight Bearing Restrictions: No   Prior Activity Level Pt enjoys getting out everyday.  Home Assistive Devices / Equipment Home Assistive Devices/Equipment: Grab bars in shower;Grab bars around toilet Home Equipment: Kasandra Knudsen - single point;Shower seat;Bedside commode;Walker - 2 wheels  Prior Functional Level Prior Function Level of Independence: Independent Comments: pt drives and is very independent; has been on disability since 2010  Current Functional Level Cognition  Overall Cognitive Status: Within Functional Limits for tasks assessed Orientation Level: Oriented X4    Extremity Assessment (includes Sensation/Coordination)          ADLs  Grooming: Set up Where Assessed - Grooming: Supported sitting Upper Body Bathing: Minimal assistance Where Assessed - Upper Body Bathing: Supported sitting Lower Body Bathing: Maximal assistance Where Assessed - Lower Body  Bathing: Supported sit to stand Upper Body Dressing: Minimal assistance Where Assessed - Upper Body Dressing: Supported sitting Lower Body Dressing: Maximal assistance Where Assessed - Lower Body Dressing: Supported sit to Lobbyist: Moderate assistance Toileting - Clothing Manipulation and Hygiene: Moderate assistance Equipment Used: Gait belt;Rolling walker Transfers/Ambulation Related to ADLs: Pt using momentum to complete sit - stand. Pt required 5 trials before being able to stand ADL Comments: easily fatigues. Max A with LB ADL. Will benefit from AE    Mobility  Bed Mobility: Supine to Sit;Sitting - Scoot to [place] of Bed  Rolling Left: 3: Mod assist Left Sidelying to Sit: 2: Max assist Supine to Sit: 4: Min assist;With rails Sitting - Scoot to [employer] of Bed: 4: Min guard Sit to Supine: Not Tested (comment)    Transfers  Transfers: Sit to Stand;Stand to Sit Sit to Stand: 1: +2 Total assist;From elevated surface;With upper extremity assist;From bed Sit to Stand: Patient Percentage: 70% Stand to Sit: 3: Mod assist;With upper extremity assist;With armrests;To chair/3-in-1    Ambulation / Gait / Stairs / Emergency planning/management officer  Ambulation/Gait Ambulation/Gait Assistance: 4: Min assist (+ 1 to follow with chair) Ambulation Distance (Feet): 150 Feet Assistive device: Rolling walker Ambulation/Gait Assistance Details: Slightly unsteady gait needing incr support initially.  Cues needed for sequencing and safety.   Gait Pattern: Step-through pattern;Decreased stride length;Trunk flexed Gait velocity: slow Stairs: No Wheelchair Mobility Wheelchair Mobility: No    Posture / Chiropodist Sitting - Balance Support: Feet supported Static Sitting - Level of Assistance: 5: Stand by assistance Static Standing Balance Static Standing - Balance Support: Bilateral upper extremity supported Static Standing - Level of Assistance: 4: Min assist Static Standing -  Comment/# of Minutes: stood x 2 min for placement of abdominal binder  Dynamic Standing Balance Dynamic Standing - Balance Support: Left upper extremity supported;Right upper extremity supported;During functional activity Dynamic Standing - Level of Assistance: Other (comment) (min guard) Dynamic Standing - Balance Activities: Forward lean/weight shifting;Lateral lean/weight shifting High Level Balance High Level Balance Activites: Direction changes High Level Balance Comments: min guard to steady     Special needs/care consideration BiPAP/CPAP no CPM no Continuous Drip IV no Dialysis no         Life Vest no Oxygen no Special Bed no  Trach Size no Wound Vac - yes  (area) mid abdomen       Skin - colostomy R mid abdomen and wound VAC mid abdomen                         Bowel mgmt: via colostomy Bladder mgmt: using urinal Diabetic mgmt no     Previous Home Environment Living Arrangements: Spouse/significant other Available Help at Discharge: Family;Available 24 hours/day Type of Home: House Home Layout: Two level;Able to live on main level with bedroom/bathroom Home Access: Level entry Bathroom Shower/Tub: Multimedia programmer: Handicapped height Bathroom Accessibility: Yes How Accessible: Accessible via walker Home Care Services: No Additional Comments: pt was independent with all ADL's; has tub shower and reports he was able to stand and take shower; standard height commode  Discharge Living Setting Plans for Discharge Living Setting: Patient's home Type of Home at Discharge: House Discharge Home Layout: Two level (pt/wife live in the basement of two story home. Pt's mother passed away and her belongings remain upstairs.) Alternate Level Stairs-Number of Steps: flight Discharge Home Access: Stairs to enter Entrance Stairs-Number of Steps: 1 Does the patient have any problems obtaining your medications?: No  Social/Family/Support Systems Patient Roles:  Spouse Contact Information: Michelle Bova (wife) home: [phone], cell [phone] Anticipated Caregiver: wife Anticipated Caregiver's Contact Information: see above Ability/Limitations of Caregiver: wife is retired, unable to perform heavy lifting due to back issues, can provide supervision and light assistance Caregiver Availability: 24/7 Discharge Plan Discussed with Primary Caregiver: Yes Is Caregiver In Agreement with Plan?: Yes Does Caregiver/Family have Issues with Lodging/Transportation while Pt is in Rehab?: No    Goals/Additional Needs Patient/Family Goal for Rehab: mod I w/ PT and OT Expected length  of stay: 8-12 days Cultural Considerations:  (none) Dietary Needs: bland diet Equipment Needs: to be determined Pt/Family Agrees to Admission and willing to participate: Yes Program Orientation Provided & Reviewed with Pt/Caregiver Including Roles  & Responsibilities: Yes   Decrease burden of Care through IP rehab admission: NA  Possible need for SNF placement upon discharge: not anticipated   Patient Condition: This patient's condition remains as documented in the consult dated 03/01/13, in which the Rehabilitation Physician determined and documented that the patient's condition is appropriate for intensive rehabilitative care in an inpatient rehabilitation facility. Will admit to inpatient rehab today.  Preadmission Screen Completed By:  Nanetta Batty, PT 03/01/2013 4:01 PM ______________________________________________________________________   Discussed status with Dr. Naaman Plummer on 03/01/13 at 3:56 pm and received telephone approval for admission today.  Admission Coordinator:  Lateya Dauria L, time 3:56 pm/Date 03/01/13

## 2013-03-01 NOTE — Progress Notes (Signed)
11 Days Post-Op  Subjective: Patient still with limited appetite Pain localized only to incision VAC to be changed today by nursing - will not need home VAC.  Will switch to wet to dry dressings prior to discharge  Objective: Vital signs in last 24 hours: Temp:  [98 F (36.7 C)-98.5 F (36.9 C)] 98 F (36.7 C) (01/09 0500) Pulse Rate:  [91-95] 91 (01/09 0500) Resp:  [18] 18 (01/09 0500) BP: (120-149)/(80-87) 149/80 mmHg (01/09 0500) SpO2:  [97 %-99 %] 99 % (01/09 0500) Last BM Date: 02/28/13  Intake/Output from previous day: 01/08 0701 - 01/09 0700 In: 120 [P.O.:120] Out: 1225 [Urine:1225] Intake/Output this shift: Total I/O In: -  Out: 400 [Urine:400]  General appearance: alert, cooperative and no distress GI: soft, minimally tender, good ostomy output Incision - VAC with good seal; no surrounding erythema  Lab Results:   Recent Labs  02/27/13 0236 02/28/13 0510  WBC 5.4 5.4  HGB 8.6* 8.8*  HCT 27.9* 28.6*  PLT 254 247   BMET  Recent Labs  02/27/13 0236 03/01/13 0400  NA 141 144  K 4.1 4.0  CL 108 106  CO2 24 27  GLUCOSE 115* 90  BUN 3* 4*  CREATININE 1.16 1.13  CALCIUM 9.2 9.6   PT/INR No results found for this basename: LABPROT, INR,  in the last 72 hours ABG No results found for this basename: PHART, PCO2, PO2, HCO3,  in the last 72 hours  Studies/Results: No results found.  Anti-infectives: Anti-infectives   Start     Dose/Rate Route Frequency Ordered Stop   02/18/13 1057  ANCEF 1 gram in 0.9% normal saline 500 mL  Status:  Discontinued       As needed 02/18/13 1057 02/18/13 1152   02/12/13 2330  cefoTEtan (CEFOTAN) 1 g in dextrose 5 % 50 mL IVPB     1 g 100 mL/hr over 30 Minutes Intravenous  Once 02/12/13 1717 02/13/13 0024   02/12/13 0600  [MAR Hold]  cefoTEtan (CEFOTAN) 2 g in dextrose 5 % 50 mL IVPB     (On MAR Hold since 02/12/13 1206)   2 g 100 mL/hr over 30 Minutes Intravenous On call to O.R. 02/11/13 1706 02/12/13 1216       Assessment/Plan: s/p Procedure(s): EXPLORATORY LAPAROTOMY/Closure of Wound (N/A) Patient is ready for discharge from surgical standpoint.  May need rehab to regain strength. Prior to discharge, d/c VAC and change to wet to dry dressings daily.   LOS: 27 days    Shakinah Navis K. 03/01/2013

## 2013-03-02 ENCOUNTER — Inpatient Hospital Stay (HOSPITAL_COMMUNITY): Payer: Federal, State, Local not specified - PPO | Admitting: Occupational Therapy

## 2013-03-02 ENCOUNTER — Inpatient Hospital Stay (HOSPITAL_COMMUNITY): Payer: Medicare Other | Admitting: Physical Therapy

## 2013-03-02 DIAGNOSIS — I4891 Unspecified atrial fibrillation: Secondary | ICD-10-CM

## 2013-03-02 DIAGNOSIS — N179 Acute kidney failure, unspecified: Secondary | ICD-10-CM

## 2013-03-02 DIAGNOSIS — R5381 Other malaise: Secondary | ICD-10-CM

## 2013-03-02 LAB — MAGNESIUM: MAGNESIUM: 1.9 mg/dL (ref 1.5–2.5)

## 2013-03-02 MED ORDER — ENSURE COMPLETE PO LIQD
237.0000 mL | Freq: Two times a day (BID) | ORAL | Status: DC
  Administered 2013-03-03 – 2013-03-07 (×7): 237 mL via ORAL

## 2013-03-02 MED ORDER — PRO-STAT SUGAR FREE PO LIQD
30.0000 mL | Freq: Two times a day (BID) | ORAL | Status: DC
  Administered 2013-03-02 – 2013-03-07 (×10): 30 mL via ORAL
  Filled 2013-03-02 (×14): qty 30

## 2013-03-02 MED ORDER — ADULT MULTIVITAMIN W/MINERALS CH
1.0000 | ORAL_TABLET | Freq: Every day | ORAL | Status: DC
  Administered 2013-03-03 – 2013-03-07 (×5): 1 via ORAL
  Filled 2013-03-02 (×7): qty 1

## 2013-03-02 NOTE — Evaluation (Signed)
Physical Therapy Assessment and Plan  Patient Details  Name: Larry [name] MRN: [id number] Date of Birth: December 29, 1950  PT Diagnosis: Abnormal posture, Difficulty walking and Muscle weakness Rehab Potential: Good ELOS: 7-10 days   Today's Date: 03/02/2013 Time: 0930-1030 Time Calculation (min): 60 min  Problem List:  Patient Active Problem List   Diagnosis Date Noted  . Physical deconditioning 03/01/2013  . Torsades de pointes 02/27/2013  . Acute on chronic diastolic heart failure 91/50/5697  . Colon cancer 02/09/2013  . Atrial fibrillation 02/05/2013  . Acute renal failure 02/02/2013  . Hypotension 02/02/2013  . Chronic blood loss anemia 02/02/2013  . Edema 06/16/2011  . Tachycardia 06/16/2011  . IMPOTENCE OF ORGANIC ORIGIN 01/12/2009  . SLEEP APNEA, OBSTRUCTIVE 12/04/2008  . HYPERLIPIDEMIA 12/03/2008  . OBESITY 12/03/2008  . CAD 12/03/2008  . CARDIOMYOPATHY, ISCHEMIC 12/03/2008  . ATRIAL TACHYCARDIA 12/03/2008  . ASTHMA 12/03/2008  . C O P D 12/03/2008  . HYPERTENSION 12/02/2008    Past Medical History:  Past Medical History  Diagnosis Date  . Hypertension   . Hypercholesteremia   . CHF (congestive heart failure)     has diastolic heart failure grade 1; EF is 45 to 50% per echo 05/2011  . Coronary artery disease   . COPD (chronic obstructive pulmonary disease)   . Asthma   . Obesity   . Noncompliance   . NSVT (nonsustained ventricular tachycardia)     beta blocker restarted  . Atrial tachycardia     managed on beta blocker therapy   Past Surgical History:  Past Surgical History  Procedure Laterality Date  . Coronary stent placement      x2  . Cardiac catheterization  11/12/2008    stent x 2 to RCA  . Colonoscopy N/A 02/08/2013    Procedure: COLONOSCOPY;  Surgeon: [name], MD;  Location: [place];  Service: Endoscopy;  Laterality: N/A;  . Esophagogastroduodenoscopy N/A 02/08/2013    Procedure: ESOPHAGOGASTRODUODENOSCOPY (EGD);  Surgeon:  [name], MD;  [facility] [place] ENDOSCOPY;  Service: Endoscopy;  Laterality: N/A;  . Laparotomy N/A 02/12/2013    Procedure: EXPLORATORY LAPAROTOMY PARTIAL COLECTOMY WITH COLOSTOMY;  Surgeon: [name], MD;  Location: [place];  Service: General;  Laterality: N/A;  . Laparotomy N/A 02/18/2013    Procedure: EXPLORATORY LAPAROTOMY/Closure of Wound;  Surgeon: [name], MD;  Location: [place];  Service: General;  Laterality: N/A;    Assessment & Plan Clinical Impression: Larry [name] is a 63 y.o. male with history of HTN, NSVT, COPD, admitted 02/02/13 with 5 week history of progressive weakness, constipation x 2 weeks and difficulty eating or drinking. He was found to have acute renal failure with Cr -7.83, anemia 7.3 as well as obstructing colon mass. Colonoscopy with biopsy revealed adenocarcinoma and patient underwent Exp lap with partial colectomy with colostomy on 12/23 by Dr. Hulen Skains. Abdominal wound closure with VAC placement on 02/18/13. On 02/24/13, patient with V fib cardiac arrest and CPR initiated and patient shocked with subsequent A fib with RVR. Electrolyte abnormality treated and 2D echo with EF 40-45% with periapical akinesis and biatrial enlargement. Patient with recurrent torsades on 01/05 and shocked in NSR. Wound healing well and will not need VAC per CCS. Appetite slowly improving but patient noted to be severely deconditioned.   Patient transferred to CIR on 03/01/2013 .   Patient currently requires max with mobility secondary to muscle weakness and endurance.  Prior to hospitalization, patient was independent  with mobility and lived  with Spouse in a House home.  Home access is  Level entry.  Patient will benefit from skilled PT intervention to maximize safe functional mobility, minimize fall risk and decrease caregiver burden for planned discharge home with 24 hour supervision.  Anticipate patient will benefit from follow up [place] at discharge.  PT - End of  Session Activity Tolerance: Tolerates 30+ min activity with multiple rests Endurance Deficit: Yes Endurance Deficit Description: fatigues easily PT Assessment Rehab Potential: Good PT Patient demonstrates impairments in the following area(s): Balance;Endurance;Pain;Safety PT Transfers Functional Problem(s): Bed Mobility;Bed to Chair;Car;Furniture PT Locomotion Functional Problem(s): Ambulation PT Plan PT Intensity: Minimum of 1-2 x/day ,45 to 90 minutes PT Frequency: 5 out of 7 days PT Duration Estimated Length of Stay: 7-10 days PT Treatment/Interventions: Ambulation/gait training;Balance/vestibular training;DME/adaptive equipment instruction;Functional mobility training;Pain management;Patient/family education;Therapeutic Activities;Therapeutic Exercise;UE/LE Strength taining/ROM PT Transfers Anticipated Outcome(s): S/Mod-I  PT Locomotion Anticipated Outcome(s): S/Mod-I using LRAD x 300' PT Recommendation Follow Up Recommendations: Home health PT Patient destination: Home  Equipment Details: appropriate assistive device for discharge to home is to be determined as patient progresses. PLOF: Independent gait  PT Evaluation Precautions/Restrictions Precautions Precautions: Fall Precaution Comments: colostomy, would VAC, abdominal binder Restrictions Weight Bearing Restrictions: No General Chart Reviewed: Yes Family/Caregiver Present: No  Vital SignsTherapy Vitals Pulse Rate: 86 BP: 146/93 mmHg Pain Pain Assessment Pain Assessment: 0-10 Pain Score: 4  Pain Type: Surgical pain Pain Location: Abdomen Pain Orientation: Anterior Pain Descriptors / Indicators: Sore;Tender Pain Onset: Gradual Patients Stated Pain Goal: 2 Pain Intervention(s): Repositioned Multiple Pain Sites: No Home Living/Prior Functioning Home Living Available Help at Discharge: Family;Available 24 hours/day Type of Home: House Home Access: Level entry Home Layout: Two level (lives on main level and no  reason to have to go upstairs. )  Lives With: Spouse Prior Function Level of Independence: Independent with basic ADLs;Independent with homemaking with ambulation;Independent with gait  Able to Take Stairs?: Yes Driving: Yes Vocation: On disability (heart condition and on disability since ~ 2012) Comments: Patient watches TV throughout the day and reports stopping smoking in 2010 and had cardiac stents placed in 2010 and on disability in 2012. Vision/Perception  Vision - History Baseline Vision: Wears glasses only for reading Patient Visual Report: No change from baseline  Cognition Overall Cognitive Status: Within Functional Limits for tasks assessed Orientation Level: Oriented X4 Sensation Sensation Light Touch: Appears Intact Coordination Heel Shin Test: WNL Mobility Bed Mobility Bed Mobility: Rolling Left;Left Sidelying to Sit;Sitting - Scoot to [employer] of Bed;Sit to Sidelying Left;Scooting to [facility] Rolling Left: 3: Mod assist Left Sidelying to Sit: 3: Mod assist Sitting - Scoot to Edge of Bed: 4: Min guard Sit to Sidelying Left: 2: Max assist Scooting to [facility]: Not tested (comment) Transfers Transfers: Yes Sit to Stand: 4: Min assist Stand to Sit: 4: Min Gaffer Transfers: 4: Min assist Locomotion  Ambulation Ambulation: Yes Ambulation/Gait Assistance: 4: Min guard Ambulation Distance (Feet): 150 Feet Assistive device: Rolling walker Gait Gait: Yes Gait Pattern: Step-through pattern;Trunk flexed;Wide base of support Stairs / Additional Locomotion Stairs: No Wheelchair Mobility Wheelchair Mobility: No  Trunk/Postural Assessment  Postural Control Postural Control: Within Functional Limits  Balance Balance Balance Assessed: Yes Dynamic Sitting Balance Dynamic Sitting - Balance Support: Left upper extremity supported;Feet supported Dynamic Sitting - Level of Assistance: 5: Stand by assistance Static Standing Balance Static Standing - Balance Support:  Bilateral upper extremity supported Static Standing - Level of Assistance: 5: Stand by assistance Dynamic Standing Balance Dynamic Standing -  Balance Support: Bilateral upper extremity supported Dynamic Standing - Level of Assistance: 4: Min assist Dynamic Standing - Balance Activities: Lateral lean/weight shifting;Forward lean/weight shifting Extremity Assessment  RUE Assessment RUE Assessment: Within Functional Limits LUE Assessment LUE Assessment: Within Functional Limits RLE Assessment RLE Assessment: Within Functional Limits LLE Assessment LLE Assessment: Within Functional Limits  FIM:  FIM - Bed/Chair Transfer Bed/Chair Transfer: 4: Supine > Sit: Min A (steadying Pt. > 75%/lift 1 leg);3: Sit > Supine: Mod A (lifting assist/Pt. 50-74%/lift 2 legs);4: Bed > Chair or W/C: Min A (steadying Pt. > 75%);4: Chair or W/C > Bed: Min A (steadying Pt. > 75%) FIM - Locomotion: Wheelchair Locomotion: Wheelchair: 0: Activity did not occur FIM - Locomotion: Ambulation Locomotion: Ambulation Assistive Devices: Administrator Ambulation/Gait Assistance: 4: Min guard Locomotion: Ambulation: 4: Travels 150 ft or more with minimal assistance (Pt.>75%) FIM - Locomotion: Stairs Locomotion: Stairs: 0: Activity did not occur   Refer to Care Plan for Long Term Goals  Recommendations for other services: None  Discharge Criteria: Patient will be discharged from PT if patient refuses treatment 3 consecutive times without medical reason, if treatment goals not met, if there is a change in medical status, if patient makes no progress towards goals or if patient is discharged from hospital.  The above assessment, treatment plan, treatment alternatives and goals were discussed and mutually agreed upon: by patient  Clearence Ped 03/02/2013, 12:49 PM

## 2013-03-02 NOTE — Progress Notes (Signed)
Physical Therapy Session Note  Patient Details  Name: Larry [name] MRN: [id number] Date of Birth: November 24, 1950  Today's Date: 03/02/2013 Time: 1345-1430 Time Calculation (min): 45 min  Short Term Goals: Week 1:  PT Short Term Goal 1 (Week 1): Short Term Goals = Long Term Goals  Therapy Documentation Precautions:  Precautions Precautions: Fall Precaution Comments: colostomy, would VAC, abdominal binder Restrictions Weight Bearing Restrictions: No Pain: mild lower abdominal incisional pain 3/10  Therapeutic Activity:(15') Transfers sit<->stand S/min-Assist using RW wit difficulty leaning/flexing forward to get nose over knees resulting in minimal imbalance arising into RW and descending into chair Gait Training:(15') 3 x 90' using RW with slow gait,  and rest breaks due to fatigue this afternoon Therapeutic Exercise:(15') B LE's in sitting    Therapy/Group: Individual Therapy  Clearence Ped 03/02/2013, 4:38 PM

## 2013-03-02 NOTE — Evaluation (Signed)
Occupational Therapy Assessment and Plan  Patient Details  Name: Larry [name] MRN: [id number] Date of Birth: 1950/08/12  OT Diagnosis: muscle weakness (generalized) Rehab Potential: Rehab Potential: Good ELOS: 7-8   Today's Date: 03/02/2013 Time: 8- 845 Time Calculation (min): 43mn  Problem List:  Patient Active Problem List   Diagnosis Date Noted  . Physical deconditioning 03/01/2013  . Torsades de pointes 02/27/2013  . Acute on chronic diastolic heart failure 129/24/4628 . Colon cancer 02/09/2013  . Atrial fibrillation 02/05/2013  . Acute renal failure 02/02/2013  . Hypotension 02/02/2013  . Chronic blood loss anemia 02/02/2013  . Edema 06/16/2011  . Tachycardia 06/16/2011  . IMPOTENCE OF ORGANIC ORIGIN 01/12/2009  . SLEEP APNEA, OBSTRUCTIVE 12/04/2008  . HYPERLIPIDEMIA 12/03/2008  . OBESITY 12/03/2008  . CAD 12/03/2008  . CARDIOMYOPATHY, ISCHEMIC 12/03/2008  . ATRIAL TACHYCARDIA 12/03/2008  . ASTHMA 12/03/2008  . C O P D 12/03/2008  . HYPERTENSION 12/02/2008    Past Medical History:  Past Medical History  Diagnosis Date  . Hypertension   . Hypercholesteremia   . CHF (congestive heart failure)     has diastolic heart failure grade 1; EF is 45 to 50% per echo 05/2011  . Coronary artery disease   . COPD (chronic obstructive pulmonary disease)   . Asthma   . Obesity   . Noncompliance   . NSVT (nonsustained ventricular tachycardia)     beta blocker restarted  . Atrial tachycardia     managed on beta blocker therapy   Past Surgical History:  Past Surgical History  Procedure Laterality Date  . Coronary stent placement      x2  . Cardiac catheterization  11/12/2008    stent x 2 to RCA  . Colonoscopy N/A 02/08/2013    Procedure: COLONOSCOPY;  Surgeon: PBeryle Beams MD;  [place]  Service: Endoscopy;  Laterality: N/A;  . Esophagogastroduodenoscopy N/A 02/08/2013    Procedure: ESOPHAGOGASTRODUODENOSCOPY (EGD);  Surgeon: PBeryle Beams  MD;  [facility]  Service: Endoscopy;  Laterality: N/A;  . Laparotomy N/A 02/12/2013    Procedure: EXPLORATORY LAPAROTOMY PARTIAL COLECTOMY WITH COLOSTOMY;  Surgeon: JGwenyth Ober MD;  [place]  Service: General;  Laterality: N/A;  . Laparotomy N/A 02/18/2013    Procedure: EXPLORATORY LAPAROTOMY/Closure of Wound;  Surgeon: ARalene Ok MD;  [facility];  Service: General;  Laterality: N/A;    Assessment & Plan Clinical Impression: Patient is a 63y.o. year old male with history of HTN, NSVT, COPD, admitted 02/02/13 with 5 week history of progressive weakness, constipation x 2 weeks and difficulty eating or drinking. He was found to have acute renal failure with Cr -7.83, anemia 7.3 as well as obstructing colon mass. Colonoscopy with biopsy revealed adenocarcinoma and patient underwent Exp lap with partial colectomy with colostomy on 12/23 by Dr. WHulen Skains Abdominal wound closure with VAC placement on 02/18/13. On 02/24/13, patient with V fib cardiac arrest and CPR initiated and patient shocked with subsequent A fib with RVR. Electrolyte abnormality treated and 2D echo with EF 40-45% with periapical akinesis and biatrial enlargement. Patient with recurrent torsades on 01/05 and shocked in NSR. Wound healing well and will not need VAC per CCS. Appetite slowly improving but patient noted to be severely deconditioned.   Patient transferred to CIR on 03/01/2013 .    Patient currently requires min to mod A with basic self-care skills and min A for basic mobility secondary to muscle weakness, decreased cardiorespiratoy endurance and decreased  standing balance and decreased balance strategies.  Prior to hospitalization, patient could complete ADL with independent .  Patient will benefit from skilled intervention to decrease level of assist with basic self-care skills and increase independence with basic self-care skills prior to discharge home with care partner.  Anticipate patient will  require A with IADLs and no further OT follow recommended.  OT - End of Session Activity Tolerance: Tolerates 30+ min activity with multiple rests Endurance Deficit: Yes Endurance Deficit Description: fatigues easily OT Assessment Rehab Potential: Good OT Patient demonstrates impairments in the following area(s): Balance;Edema;Endurance;Motor;Pain;Skin Integrity OT Basic ADL's Functional Problem(s): Grooming;Bathing;Dressing;Toileting OT Advanced ADL's Functional Problem(s): Simple Meal Preparation OT Transfers Functional Problem(s): Toilet OT Additional Impairment(s): None OT Plan OT Intensity: Minimum of 1-2 x/day, 45 to 90 minutes OT Frequency: 5 out of 7 days OT Duration/Estimated Length of Stay: 7-8 OT Treatment/Interventions: Balance/vestibular training;Community reintegration;Discharge planning;DME/adaptive equipment instruction;Functional mobility training;Psychosocial support;Self Care/advanced ADL retraining;Patient/family education;Therapeutic Exercise;UE/LE Coordination activities;UE/LE Strength taining/ROM OT Self Feeding Anticipated Outcome(s): no goal OT Basic Self-Care Anticipated Outcome(s): mod I OT Toileting Anticipated Outcome(s): mod I  OT Bathroom Transfers Anticipated Outcome(s): mod I  OT Recommendation Patient destination: Home Follow Up Recommendations: None   Skilled Therapeutic Intervention OT eval initiated with OT goals, purpose and role discussed. Self care retraining including bathing at EOB with focus on bed mobility, sit to stand, standing balance and tolerance. During standing task pt's ostomy bag opened and contents spilled all over. RN in the address issued and order a new ostomy bag. Pt had to be returned to bed for changing of bag and hygiene.   2nd session  1130-1200 1:1 focus on completing dressing sitting recliner sit to stand, standing balance, activity tolerance/ endurance and functional ambulation with RW. Pt able to walk from room to the  dayroom, rest and ambulate back to his room. Pt with decr activity tolerance but motivated to keep working hard.   OT Evaluation Precautions/Restrictions  Precautions Precautions: Fall Precaution Comments: colostomy, would VAC, abdominal binder Restrictions Weight Bearing Restrictions: No General Chart Reviewed: Yes Amount of Missed OT Time (min): 15 Minutes Family/Caregiver Present: No    Pain Pain Assessment Pain Assessment: 0-10 Pain Score: 4  Pain Type: Surgical pain Pain Location: Abdomen Pain Orientation: Anterior Pain Descriptors / Indicators: Sore;Tender Pain Onset: Gradual Patients Stated Pain Goal: 2 Pain Intervention(s): Repositioned Multiple Pain Sites: No Home Living/Prior Functioning Home Living Available Help at Discharge: Family;Available 24 hours/day Type of Home: House Home Access: Level entry Home Layout: Two level;Able to live on main level with bedroom/bathroom  Lives With: Spouse Prior Function Level of Independence: Independent with basic ADLs;Independent with homemaking with ambulation;Independent with gait  Able to Take Stairs?: Yes Driving: Yes Vocation: On disability (heart condition and on disability since ~ 2012) Comments: Patient watches TV throughout the day and reports stopping smoking in 2010 and had cardiac stents placed in 2010 and on disability in 2012. ADL  see FIM Vision/Perception  Vision - History Baseline Vision: Wears glasses only for reading Patient Visual Report: No change from baseline Vision - Assessment Eye Alignment: Within Functional Limits Perception Perception: Within Functional Limits Praxis Praxis: Intact  Cognition Overall Cognitive Status: Within Functional Limits for tasks assessed Orientation Level: Oriented X4 Safety/Judgment: Appears intact Sensation Sensation Light Touch: Appears Intact Proprioception: Appears Intact Coordination Gross Motor Movements are Fluid and Coordinated: Yes Fine Motor  Movements are Fluid and Coordinated: Yes Heel Shin Test: WNL Motor  Motor Motor: Within Functional Limits Motor -  Skilled Clinical Observations: generalized weakness Mobility  Bed Mobility Bed Mobility: Rolling Left;Left Sidelying to Sit;Sitting - Scoot to [employer] of Bed;Sit to Sidelying Left;Scooting to [facility] Rolling Left: 3: Mod assist Left Sidelying to Sit: 3: Mod assist Supine to Sit: 4: Min assist Sitting - Scoot to Edge of Bed: 4: Min guard Sit to Supine: 4: Min assist Sit to Sidelying Left: 2: Max assist Scooting to [facility]: Not tested (comment) Transfers Transfers: Sit to Stand;Stand to Sit Sit to Stand: 4: Min assist Stand to Sit: 4: Min assist  Trunk/Postural Assessment  Cervical Assessment Cervical Assessment: Within Functional Limits Thoracic Assessment Thoracic Assessment: Within Functional Limits Lumbar Assessment Lumbar Assessment: Within Functional Limits Postural Control Postural Control: Within Functional Limits  Balance Balance Balance Assessed: Yes Static Sitting Balance Static Sitting - Level of Assistance: 6: Modified independent (Device/Increase time) Dynamic Sitting Balance Dynamic Sitting - Balance Support: Left upper extremity supported;Feet supported Dynamic Sitting - Level of Assistance: 5: Stand by assistance Static Standing Balance Static Standing - Balance Support: During functional activity Static Standing - Level of Assistance: 5: Stand by assistance;4: Min assist Dynamic Standing Balance Dynamic Standing - Balance Support: During functional activity Dynamic Standing - Level of Assistance: 4: Min assist Dynamic Standing - Balance Activities: Lateral lean/weight shifting;Forward lean/weight shifting Extremity/Trunk Assessment RUE Assessment RUE Assessment: Within Functional Limits LUE Assessment LUE Assessment: Within Functional Limits  FIM:  FIM - Eating Eating Activity: 5: Set-up assist for open containers FIM - Grooming Grooming: 5:  Set-up assist to obtain items FIM - Bathing Bathing Steps Patient Completed: Chest;Right Arm;Left Arm;Abdomen;Right upper leg;Left upper leg Bathing: 3: Mod-Patient completes 5-7 18f10 parts or 50-74% FIM - Upper Body Dressing/Undressing Upper body dressing/undressing steps patient completed: Thread/unthread right sleeve of pullover shirt/dresss;Thread/unthread left sleeve of pullover shirt/dress;Put head through opening of pull over shirt/dress;Pull shirt over trunk Upper body dressing/undressing: 5: Set-up assist to: Obtain clothing/put away FIM - Lower Body Dressing/Undressing Lower body dressing/undressing steps patient completed: Thread/unthread left pants leg;Pull pants up/down Lower body dressing/undressing: 2: Max-Patient completed 25-49% of tasks FIM - Bed/Chair Transfer Bed/Chair Transfer: 4: Supine > Sit: Min A (steadying Pt. > 75%/lift 1 leg);4: Bed > Chair or W/C: Min A (steadying Pt. > 75%);4: Chair or W/C > Bed: Min A (steadying Pt. > 75%);3: Sit > Supine: Mod A (lifting assist/Pt. 50-74%/lift 2 legs)   Refer to Care Plan for Long Term Goals  Recommendations for other services: None  Discharge Criteria: Patient will be discharged from OT if patient refuses treatment 3 consecutive times without medical reason, if treatment goals not met, if there is a change in medical status, if patient makes no progress towards goals or if patient is discharged from hospital.  The above assessment, treatment plan, treatment alternatives and goals were discussed and mutually agreed upon: by patient  SNicoletta Ba1/11/2013, 12:34 PM

## 2013-03-02 NOTE — Progress Notes (Signed)
Patient ID: Larry [name], male   DOB: 03-Jul-1950, 63 y.o.   MRN: [id number]  03/02/13.  63 y/o admit with severe deconditioning;   history of HTN, NSVT, COPD, admitted 02/02/13 with 5 week history of progressive weakness, constipation x 2 weeks and difficulty eating or drinking. He was found to have acute renal failure with Cr -7.83, anemia 7.3 as well as obstructing colon mass. Colonoscopy with biopsy revealed adenocarcinoma and patient underwent Exp lap with partial colectomy with colostomy on 12/23 by Dr. Hulen Skains. Abdominal wound closure with VAC placement on 02/18/13. On 02/24/13, patient with V fib cardiac arrest and CPR initiated and patient shocked with subsequent A fib with RVR. Electrolyte abnormality treated and 2D echo with EF 40-45% with periapical akinesis and biatrial enlargement. Patient with recurrent torsades on 01/05 and shocked in NSR.  Chronic medical problems include: Ischemic CM, HLD, HTN, CAD, CAF, COPD.   Past Medical History   Diagnosis  Date   .  Hypertension     .  Hypercholesteremia     .  CHF (congestive heart failure)         has diastolic heart failure grade 1; EF is 45 to 50% per echo 05/2011   .  Coronary artery disease     .  COPD (chronic obstructive pulmonary disease)     .  Asthma     .  Obesity     .  Noncompliance     .  NSVT (nonsustained ventricular tachycardia)         beta blocker restarted   .  Atrial tachycardia         managed on beta blocker therapy       Past Surgical History   Procedure  Laterality  Date   .  Coronary stent placement           x2   .  Cardiac catheterization    11/12/2008       stent x 2 to RCA   .  Colonoscopy  N/A  02/08/2013       Procedure: COLONOSCOPY;  Surgeon: [name], MD;  Location: [place];  Service: Endoscopy;  Laterality: N/A;   .  Esophagogastroduodenoscopy  N/A  02/08/2013       Procedure: ESOPHAGOGASTRODUODENOSCOPY (EGD);  Surgeon: [name], MD;  Location: [facility];  Service:  Endoscopy;  Laterality: N/A;   .  Laparotomy  N/A  02/12/2013       Procedure: EXPLORATORY LAPAROTOMY PARTIAL COLECTOMY WITH COLOSTOMY;  Surgeon: [name], MD;  Location: [place];  Service: General;  Laterality: N/A;   .  Laparotomy  N/A  02/18/2013       Procedure: EXPLORATORY LAPAROTOMY/Closure of Wound;  Surgeon: [name], MD;  [facility];  Service: General;  Laterality: N/A;       Allergies: No Known Allergies      Medications Prior to Admission   Medication  Sig  Dispense  Refill   .  aspirin EC 325 MG tablet  Take 325 mg by mouth daily.         .  carvedilol (COREG) 25 MG tablet  Take 25 mg by mouth daily.         .  clopidogrel (PLAVIX) 75 MG tablet  Take 75 mg by mouth daily with breakfast.         .  diltiazem (CARDIZEM CD) 240 MG 24 hr capsule  Take 240 mg by mouth daily.         [name] Kitchen  furosemide (LASIX) 40 MG tablet  Take 40 mg by mouth 2 (two) times daily.         [name] Kitchen  levalbuterol (XOPENEX HFA) 45 MCG/ACT inhaler  Inhale 1-2 puffs into the lungs every 4 (four) hours as needed for wheezing.         [name] Kitchen  lisinopril (PRINIVIL,ZESTRIL) 20 MG tablet  Take 20 mg by mouth daily.         .  nitroGLYCERIN (NITROSTAT) 0.4 MG SL tablet  Place 0.4 mg under the tongue every 5 (five) minutes as needed for chest pain.         .  potassium chloride SA (K-DUR,KLOR-CON) 20 MEQ tablet  Take 20 mEq by mouth daily.         .  rosuvastatin (CRESTOR) 40 MG tablet  Take 40 mg by mouth daily.          Patient Vitals for the past 24 hrs:  BP Temp Temp src Pulse Resp SpO2 Height Weight  03/02/13 0754 146/93 mmHg - - 86 - - - -  03/02/13 0443 125/83 mmHg 98 F (36.7 C) Oral 81 19 98 % - -  03/01/13 1930 144/89 mmHg 98.2 F (36.8 C) Oral 90 19 96 % 5\' 10"  (1.778 m) 129.9 kg (286 lb 6 oz)     Intake/Output Summary (Last 24 hours) at 03/02/13 1034 Last data filed at 03/02/13 0443  Gross per 24 hour  Intake      0 ml  Output    400 ml  Net   -400 ml       Constitutional: He is  oriented to person, place, and time. He appears well-developed and well-nourished.  obese  HENT:   Head: Normocephalic and atraumatic.  Right Ear: External ear normal.  Left Ear: External ear normal.  Eyes: Pupils are equal, round, and reactive to light.  Neck: Normal range of motion. Neck supple. No JVD present. No tracheal deviation present. No thyromegaly present.  Cardiovascular: Normal rate and regular rhythm.  Exam reveals no friction rub.    No murmur heard. Respiratory: Effort normal and breath sounds normal. No respiratory distress. He has no wheezes.  GI: Bowel sounds are normal. He exhibits distension. There is tenderness.  Ostomy in place, leaking;  Normal appearing stool. Vacuum dressing in place over midline  Skin: Skin is warm and dry.          Medical Problem List and Plan:  1.  Colon cancer s/p colon RSXN:               - Add protein supplement to help promote wound healing.               -Reports poor appetite--dietary consult to help with food choices.               -ostomy care/mgt education 2.  Wound care: continue with current vac 3. . Torsades/ A Fib: Continue coreg bid with Kdur and Mag ox as supplement for electrolyte abnormality. Recommendations to keep Mg > 2.0.  Recheck lytes Monday.   4. Acute on chronic CHF:  Continue aldactone and coreg. Monitor daily weights.

## 2013-03-02 NOTE — Progress Notes (Signed)
INITIAL NUTRITION ASSESSMENT  DOCUMENTATION CODES Per approved criteria  -Severe malnutrition in the context of chronic illness  Pt meets criteria for SEVERE MALNUTRITION in the context of Chronic Illness as evidenced by 15% weight loss in less than 3 months and estimated energy intake <75% of estimated energy needs for one month.  INTERVENTION: Provide Pro-stat BID Provide Ensure Complete BID Encourage PO intake and protein-rich foods Provide Multivitamin with minerals daily  NUTRITION DIAGNOSIS: Inadequate oral intake related to poor appetite as evidenced by 15% weight loss in less than 3 months.   Goal: Pt to meet >/= 90% of their estimated nutrition needs   Monitor:  PO intake Weight trends Labs  Reason for Assessment: Consult  63 y.o. male  Admitting Dx: <principal problem not specified>  ASSESSMENT: 63 y/o admit with severe deconditioning; history of HTN, NSVT, COPD, admitted 02/02/13 with 5 week history of progressive weakness, constipation x 2 weeks and difficulty eating or drinking. He was found to have acute renal failure with Cr -7.83, anemia 7.3 as well as obstructing colon mass. Colonoscopy with biopsy revealed adenocarcinoma and patient underwent Exp lap with partial colectomy with colostomy on 12/23 by Dr. Hulen Skains. Abdominal wound closure with VAC placement on 02/18/13.    RD consulted for diet education. Per MD note, pt reports poor appetite--dietary consult to help with food choices. Per nursing notes pt is eating 25% of most meals. Weight history shows that pt lost 49 lbs from 12/06/12 to 02/27/13. Pt states his appetite is poor. He doesn't like the [employer] but, he does like chocolate Ensure. Encouraged pt to eat well to help improve his strength and nutrition status. Encouraged protein-rich foods. Pt reports weighing 330 lbs PTA and losing weight during hospitalization due to poor po intake.    Height: Ht Readings from Last 1 Encounters:  03/01/13 5\' 10"   (1.778 m)    Weight: Wt Readings from Last 1 Encounters:  03/01/13 286 lb 6 oz (129.9 kg)    Ideal Body Weight: 166 lbs  % Ideal Body Weight: 172%  Wt Readings from Last 10 Encounters:  03/01/13 286 lb 6 oz (129.9 kg)  02/27/13 277 lb 12.5 oz (126 kg)  02/27/13 277 lb 12.5 oz (126 kg)  02/27/13 277 lb 12.5 oz (126 kg)  02/27/13 277 lb 12.5 oz (126 kg)  12/06/12 326 lb (147.873 kg)  07/04/11 340 lb (154.223 kg)  06/18/11 344 lb 9.6 oz (156.31 kg)  11/26/09 316 lb (143.337 kg)  01/12/09 304 lb (137.893 kg)    Usual Body Weight: 330 lbs  % Usual Body Weight: 87%  BMI:  Body mass index is 41.09 kg/(m^2).  Estimated Nutritional Needs: Kcal: 2000-2300 Protein: 115-130 grams Fluid: 3.1 L/day  Skin: +1 generalized edema, non-pitting RLE and LLE edema; abdominal incision  Diet Order: Criss Rosales  EDUCATION NEEDS: -No education needs identified at this time   Intake/Output Summary (Last 24 hours) at 03/02/13 1535 Last data filed at 03/02/13 0443  Gross per 24 hour  Intake      0 ml  Output    400 ml  Net   -400 ml    Last BM: 1/10  Labs:   Recent Labs Lab 02/25/13 0429 02/26/13 0500 02/27/13 0236 03/01/13 0400 03/02/13 0500  NA 143 140 141 144  --   K 3.5* 3.9 4.1 4.0  --   CL 107 104 108 106  --   CO2 25 24 24 27   --   BUN <3* <3* 3*  4*  --   CREATININE 0.99 1.01 1.16 1.13  --   CALCIUM 8.9 9.1 9.2 9.6  --   MG 2.0  --   --  1.7 1.9  GLUCOSE 94 99 115* 90  --     CBG (last 3)  No results found for this basename: GLUCAP,  in the last 72 hours  Scheduled Meds: . aspirin  81 mg Oral Daily  . carvedilol  6.25 mg Oral BID WC  . enoxaparin (LOVENOX) injection  60 mg Subcutaneous Q24H  . feeding supplement (RESOURCE BREEZE)  1 Container Oral TID BM  . ferrous sulfate  325 mg Oral Q breakfast  . magnesium oxide  400 mg Oral Daily  . pantoprazole  40 mg Oral QHS  . potassium chloride  40 mEq Oral Daily  . protein supplement  1 scoop Oral TID WC  .  simethicone  80 mg Oral QID  . spironolactone  25 mg Oral Daily    Continuous Infusions:   Past Medical History  Diagnosis Date  . Hypertension   . Hypercholesteremia   . CHF (congestive heart failure)     has diastolic heart failure grade 1; EF is 45 to 50% per echo 05/2011  . Coronary artery disease   . COPD (chronic obstructive pulmonary disease)   . Asthma   . Obesity   . Noncompliance   . NSVT (nonsustained ventricular tachycardia)     beta blocker restarted  . Atrial tachycardia     managed on beta blocker therapy    Past Surgical History  Procedure Laterality Date  . Coronary stent placement      x2  . Cardiac catheterization  11/12/2008    stent x 2 to RCA  . Colonoscopy N/A 02/08/2013    Procedure: COLONOSCOPY;  Surgeon: [name], MD;  Location: [place];  Service: Endoscopy;  Laterality: N/A;  . Esophagogastroduodenoscopy N/A 02/08/2013    Procedure: ESOPHAGOGASTRODUODENOSCOPY (EGD);  Surgeon: [name], MD;  Location[facility] ENDOSCOPY;  Service: Endoscopy;  Laterality: N/A;  . Laparotomy N/A 02/12/2013    Procedure: EXPLORATORY LAPAROTOMY PARTIAL COLECTOMY WITH COLOSTOMY;  Surgeon: [name], MD;  Location: [place];  Service: General;  Laterality: N/A;  . Laparotomy N/A 02/18/2013    Procedure: EXPLORATORY LAPAROTOMY/Closure of Wound;  Surgeon: [name], MD;  Location: [place];  Service: General;  Laterality: N/A;    Pryor Ochoa RD, LDN Inpatient Clinical Dietitian Pager: [phone] After Hours Pager: [phone]

## 2013-03-03 ENCOUNTER — Inpatient Hospital Stay (HOSPITAL_COMMUNITY): Payer: Medicare Other | Admitting: Physical Therapy

## 2013-03-03 DIAGNOSIS — C189 Malignant neoplasm of colon, unspecified: Secondary | ICD-10-CM

## 2013-03-03 NOTE — Progress Notes (Signed)
Physical Therapy Session Note  Patient Details  Name: Larry [name] MRN: [id number] Date of Birth: Jun 28, 1950  Today's Date: 03/03/2013 Time: 5093-2671 Time Calculation (min): 43 min  Short Term Goals: Week 1:  PT Short Term Goal 1 (Week 1): Short Term Goals = Long Term Goals  Skilled Therapeutic Interventions/Progress Updates:  Pt was seen bedside in the am. Pt performed multiple transfers sit to stand with rolling walker and min guard to S. Pt performed w/c for UE and LE strengthening and with S and verbal cues. Pt performed cone taps for LE strengthening. Pt ambulated 243 feet and 200 feet with rolling walker and min guard to S.   Therapy Documentation Precautions:  Precautions Precautions: Fall Precaution Comments: colostomy, would VAC, abdominal binder Restrictions Weight Bearing Restrictions: No General:   Pain: No c/o pain.    Locomotion : Ambulation Ambulation/Gait Assistance: 4: Min guard   See FIM for current functional status  Therapy/Group: Individual Therapy  Dub Amis 03/03/2013, 12:53 PM

## 2013-03-03 NOTE — Progress Notes (Signed)
Patient ID: Larry [name], male   DOB: [birth year], 63 y.o.   MRN: [id number]   Patient ID: Larry [name], male   DOB: Feb 27, 1950, 62 y.o.   MRN: [id number]  03/03/13.  63 y/o admit with severe deconditioning;   history of HTN, NSVT, COPD, admitted 02/02/13 with 5 week history of progressive weakness, constipation x 2 weeks and difficulty eating or drinking. He was found to have acute renal failure with Cr -7.83, anemia 7.3 as well as obstructing colon mass. Colonoscopy with biopsy revealed adenocarcinoma and patient underwent Exp lap with partial colectomy with colostomy on 12/23 by Dr. Hulen Skains. Abdominal wound closure with VAC placement on 02/18/13. On 02/24/13, patient with V fib cardiac arrest and CPR initiated and patient shocked with subsequent A fib with RVR. Electrolyte abnormality treated and 2D echo with EF 40-45% with periapical akinesis and biatrial enlargement. Patient with recurrent torsades on 01/05 and shocked in NSR.  Chronic medical problems include: Ischemic CM, HLD, HTN, CAD, CAF, COPD. Doing quite well this morning was very comfortable night.  Colostomy is functioning better without further leaking. In   Past Medical History   Diagnosis  Date   .  Hypertension     .  Hypercholesteremia     .  CHF (congestive heart failure)         has diastolic heart failure grade 1; EF is 45 to 50% per echo 05/2011   .  Coronary artery disease     .  COPD (chronic obstructive pulmonary disease)     .  Asthma     .  Obesity     .  Noncompliance     .  NSVT (nonsustained ventricular tachycardia)         beta blocker restarted   .  Atrial tachycardia         managed on beta blocker therapy       Past Surgical History   Procedure  Laterality  Date   .  Coronary stent placement           x2   .  Cardiac catheterization    11/12/2008       stent x 2 to RCA   .  Colonoscopy  N/A  02/08/2013       Procedure: COLONOSCOPY;  Surgeon: [name], MD;  Location: [facility];   Service: Endoscopy;  Laterality: N/A;   .  Esophagogastroduodenoscopy  N/A  02/08/2013       Procedure: ESOPHAGOGASTRODUODENOSCOPY (EGD);  Surgeon: [name], MD;  Location: [facility] ENDOSCOPY;  Service: Endoscopy;  Laterality: N/A;   .  Laparotomy  N/A  02/12/2013       Procedure: EXPLORATORY LAPAROTOMY PARTIAL COLECTOMY WITH COLOSTOMY;  Surgeon: [name], MD;  Location: [facility];  Service: General;  Laterality: N/A;   .  Laparotomy  N/A  02/18/2013       Procedure: EXPLORATORY LAPAROTOMY/Closure of Wound;  Surgeon: [name], MD;  Location: [facility] OR;  Service: General;  Laterality: N/A;       Allergies: No Known Allergies      Medications Prior to Admission   Medication  Sig  Dispense  Refill   .  aspirin EC 325 MG tablet  Take 325 mg by mouth daily.         .  carvedilol (COREG) 25 MG tablet  Take 25 mg by mouth daily.         .  clopidogrel (PLAVIX) 75 MG tablet  Take 75 mg by mouth daily with breakfast.         .  diltiazem (CARDIZEM CD) 240 MG 24 hr capsule  Take 240 mg by mouth daily.         .  furosemide (LASIX) 40 MG tablet  Take 40 mg by mouth 2 (two) times daily.         [name] Kitchen  levalbuterol (XOPENEX HFA) 45 MCG/ACT inhaler  Inhale 1-2 puffs into the lungs every 4 (four) hours as needed for wheezing.         [name] Kitchen  lisinopril (PRINIVIL,ZESTRIL) 20 MG tablet  Take 20 mg by mouth daily.         .  nitroGLYCERIN (NITROSTAT) 0.4 MG SL tablet  Place 0.4 mg under the tongue every 5 (five) minutes as needed for chest pain.         .  potassium chloride SA (K-DUR,KLOR-CON) 20 MEQ tablet  Take 20 mEq by mouth daily.         .  rosuvastatin (CRESTOR) 40 MG tablet  Take 40 mg by mouth daily.          Patient Vitals for the past 24 hrs:  BP Temp Temp src Pulse Resp SpO2  03/03/13 0452 103/71 mmHg 98.1 F (36.7 C) Oral 74 18 96 %  03/02/13 1732 108/68 mmHg - - - - -  03/02/13 1725 105/69 mmHg - - 89 - -  03/02/13 1500 133/66 mmHg 98.8 F (37.1 C) Oral 89 18 99 %     Intake/Output  Summary (Last 24 hours) at 03/03/13 0837 Last data filed at 03/03/13 0453  Gross per 24 hour  Intake      0 ml  Output    650 ml  Net   -650 ml       Constitutional: He is oriented to person, place, and time. He appears well-developed and well-nourished.  obese  HENT:   Head: Normocephalic and atraumatic.  Right Ear: External ear normal.  Left Ear: External ear normal.  Eyes: Pupils are equal, round, and reactive to light.  Neck: Normal range of motion. Neck supple. No JVD present. No tracheal deviation present. No thyromegaly present.  Cardiovascular: Normal rate and regular rhythm.  Exam reveals no friction rub.    No murmur heard. Respiratory: Effort normal and breath sounds normal. No respiratory distress. He has no wheezes.  GI: Bowel sounds are normal. He exhibits distension. There is no  tenderness.  Ostomy in place, leaking resolved;  Normal appearing stool. Vacuum dressing in place over midline  Skin: Skin is warm and dry. Mild stasis changes          Medical Problem List and Plan:  1.  Colon cancer s/p colon RSXN:               - Add protein supplement to help promote wound healing.               -Reports poor appetite--dietary consult to help with food choices.               -ostomy care/mgt education 2.  Wound care: continue with current vac 3. . Torsades/ A Fib: Continue coreg bid with Kdur and Mag ox as supplement for electrolyte abnormality. Recommendations to keep Mg > 2.0.  Recheck lytes Monday.   4. Acute on chronic CHF:  Continue aldactone and coreg. Monitor daily weights.

## 2013-03-04 ENCOUNTER — Inpatient Hospital Stay (HOSPITAL_COMMUNITY): Payer: Medicare Other

## 2013-03-04 ENCOUNTER — Inpatient Hospital Stay (HOSPITAL_COMMUNITY): Payer: Federal, State, Local not specified - PPO | Admitting: Occupational Therapy

## 2013-03-04 ENCOUNTER — Inpatient Hospital Stay (HOSPITAL_COMMUNITY): Payer: Medicare Other | Admitting: Occupational Therapy

## 2013-03-04 DIAGNOSIS — R5381 Other malaise: Secondary | ICD-10-CM

## 2013-03-04 LAB — URINALYSIS, ROUTINE W REFLEX MICROSCOPIC
Bilirubin Urine: NEGATIVE
GLUCOSE, UA: NEGATIVE mg/dL
Hgb urine dipstick: NEGATIVE
Ketones, ur: NEGATIVE mg/dL
Nitrite: NEGATIVE
PROTEIN: NEGATIVE mg/dL
Specific Gravity, Urine: 1.006 (ref 1.005–1.030)
UROBILINOGEN UA: 0.2 mg/dL (ref 0.0–1.0)
pH: 6 (ref 5.0–8.0)

## 2013-03-04 LAB — BASIC METABOLIC PANEL
BUN: 10 mg/dL (ref 6–23)
CHLORIDE: 103 meq/L (ref 96–112)
CO2: 24 mEq/L (ref 19–32)
Calcium: 9.1 mg/dL (ref 8.4–10.5)
Creatinine, Ser: 1.24 mg/dL (ref 0.50–1.35)
GFR calc non Af Amer: 62 mL/min — ABNORMAL LOW (ref >90)
GFR, EST AFRICAN AMERICAN: 71 mL/min — AB (ref >90)
Glucose, Bld: 100 mg/dL — ABNORMAL HIGH (ref 70–99)
POTASSIUM: 3.9 meq/L (ref 3.7–5.3)
Sodium: 139 mEq/L (ref 137–147)

## 2013-03-04 LAB — URINE MICROSCOPIC-ADD ON

## 2013-03-04 LAB — CBC
HEMATOCRIT: 26.6 % — AB (ref 39.0–52.0)
HEMOGLOBIN: 8.4 g/dL — AB (ref 13.0–17.0)
MCH: 28.8 pg (ref 26.0–34.0)
MCHC: 31.6 g/dL (ref 30.0–36.0)
MCV: 91.1 fL (ref 78.0–100.0)
Platelets: 207 10*3/uL (ref 150–400)
RBC: 2.92 MIL/uL — ABNORMAL LOW (ref 4.22–5.81)
RDW: 18.2 % — AB (ref 11.5–15.5)
WBC: 4.5 10*3/uL (ref 4.0–10.5)

## 2013-03-04 MED ORDER — HYDROCERIN EX CREA
TOPICAL_CREAM | Freq: Three times a day (TID) | CUTANEOUS | Status: DC
  Administered 2013-03-04 – 2013-03-06 (×8): via TOPICAL
  Administered 2013-03-06: 1 via TOPICAL
  Administered 2013-03-07: 08:00:00 via TOPICAL
  Filled 2013-03-04: qty 113

## 2013-03-04 NOTE — Care Management Note (Signed)
Inpatient Rehabilitation Center Individual Statement of Services  Patient Name:  Larry [name]  Date:  03/04/2013  Welcome to the [facility].  Our goal is to provide you with an individualized program based on your diagnosis and situation, designed to meet your specific needs.  With this comprehensive rehabilitation program, you will be expected to participate in at least 3 hours of rehabilitation therapies Monday-Friday, with modified therapy programming on the weekends.  Your rehabilitation program will include the following services:  Physical Therapy (PT), Occupational Therapy (OT), 24 hour per day rehabilitation nursing, Case Management (Social Worker), Rehabilitation Medicine, Nutrition Services and Pharmacy Services  Weekly team conferences will be held on Wednesday to discuss your progress.  Your Social Worker will talk with you frequently to get your input and to update you on team discussions.  Team conferences with you and your family in attendance may also be held.  Expected length of stay: 7-10 days  Overall anticipated outcome: supervision set-up  Depending on your progress and recovery, your program may change. Your Social Worker will coordinate services and will keep you informed of any changes. Your Social Worker's name and contact numbers are listed  below.  The following services may also be recommended but are not provided by the [facility] will be made to provide these services after discharge if needed.  Arrangements include referral to agencies that provide these services.  Your insurance has been verified to be:  [place] Your primary doctor is:  None  Pertinent information will be shared with your doctor and your insurance company.  Social Worker:  Ovidio Kin, [place] or ([phone]  Information discussed with and copy given to patient by: Elease Hashimoto, 03/04/2013, 10:23 AM

## 2013-03-04 NOTE — Progress Notes (Signed)
Social Work Assessment and Plan Social Work Assessment and Plan  Patient Details  Name: Larry [name] MRN: [id number] Date of Birth: 16-Oct-1950  Today's Date: 03/04/2013  Problem List:  Patient Active Problem List   Diagnosis Date Noted  . Physical deconditioning 03/01/2013  . Torsades de pointes 02/27/2013  . Acute on chronic diastolic heart failure 32/67/1245  . Colon cancer 02/09/2013  . Atrial fibrillation 02/05/2013  . Acute renal failure 02/02/2013  . Hypotension 02/02/2013  . Chronic blood loss anemia 02/02/2013  . Edema 06/16/2011  . Tachycardia 06/16/2011  . IMPOTENCE OF ORGANIC ORIGIN 01/12/2009  . SLEEP APNEA, OBSTRUCTIVE 12/04/2008  . HYPERLIPIDEMIA 12/03/2008  . OBESITY 12/03/2008  . CAD 12/03/2008  . CARDIOMYOPATHY, ISCHEMIC 12/03/2008  . ATRIAL TACHYCARDIA 12/03/2008  . ASTHMA 12/03/2008  . C O P D 12/03/2008  . HYPERTENSION 12/02/2008   Past Medical History:  Past Medical History  Diagnosis Date  . Hypertension   . Hypercholesteremia   . CHF (congestive heart failure)     has diastolic heart failure grade 1; EF is 45 to 50% per echo 05/2011  . Coronary artery disease   . COPD (chronic obstructive pulmonary disease)   . Asthma   . Obesity   . Noncompliance   . NSVT (nonsustained ventricular tachycardia)     beta blocker restarted  . Atrial tachycardia     managed on beta blocker therapy   Past Surgical History:  Past Surgical History  Procedure Laterality Date  . Coronary stent placement      x2  . Cardiac catheterization  11/12/2008    stent x 2 to RCA  . Colonoscopy N/A 02/08/2013    Procedure: COLONOSCOPY;  Surgeon: [name], MD;  Location: [facility];  Service: Endoscopy;  Laterality: N/A;  . Esophagogastroduodenoscopy N/A 02/08/2013    Procedure: ESOPHAGOGASTRODUODENOSCOPY (EGD);  Surgeon: [name], MD;  Location: [facility] ENDOSCOPY;  Service: Endoscopy;  Laterality: N/A;  . Laparotomy N/A 02/12/2013    Procedure:  EXPLORATORY LAPAROTOMY PARTIAL COLECTOMY WITH COLOSTOMY;  Surgeon: [name], MD;  Location: [facility];  Service: General;  Laterality: N/A;  . Laparotomy N/A 02/18/2013    Procedure: EXPLORATORY LAPAROTOMY/Closure of Wound;  Surgeon: [name], MD;  Location: [facility];  Service: General;  Laterality: N/A;   Social History:  reports that he quit smoking about 4 years ago. His smoking use included Cigarettes. He smoked 0.00 packs per day. He has never used smokeless tobacco. He reports that he does not drink alcohol or use illicit drugs.  Family / Support Systems Marital Status: Married Patient Roles: Spouse;Other (Comment) (retiree) Spouse/Significant Other: [name]  [phone]  [phone]-cell Other Supports: Friends Anticipated Caregiver: Wife Ability/Limitations of Caregiver: Wife has back issues so can not lift but otherwise can assist Caregiver Availability: 24/7 Family Dynamics: Pt and wife are close and have a good relationship, according to pt.  He is confident she will assist wiht his colostomy care and whatever he needs.  He reports they rely upon each other.  Social History Preferred language: English Religion:  Cultural Background: No issues Education: High School Read: Yes Write: Yes Employment Status: Disabled Date Retired/Disabled/Unemployed: 2010 Freight forwarder Issues: No issues Guardian/Conservator: None-according to MD pt is capable of making his own decisions while here.   Abuse/Neglect Physical Abuse: Denies Verbal Abuse: Denies Sexual Abuse: Denies Exploitation of patient/patient's resources: Denies Self-Neglect: Denies  Emotional Status Pt's affect, behavior adn adjustment status: Pt is motivated to improve and regain his strength  while here.  He realizes this may take time and he is willing to be patient.  He is glad to be here on rehab the last step before going home.  He has relied upon himself in the past and this has gotten him  through. Recent Psychosocial Issues: Other health issues-has had multiple ones in a short period of time Pyschiatric History: No history deferred depression screen due to feels he is doing and coping ok at this time.  He will let me know if this changes and will check in with team and wife to confirm his coping.  Patient / Family Perceptions, Expectations & Goals Pt/Family understanding of illness & functional limitations: Pt and wife have a good understanding of his diagnosis and treatment plan.  He wants to be able to do as much as he can for himself, so not to burden his wife, but realizes he will need help with his colostomy care at discharge. Premorbid pt/family roles/activities: Husband, Retiree, Home owner, [place] member, Friend, etc Anticipated changes in roles/activities/participation: resume at discharge Pt/family expectations/goals: Pt states; " I want to do as mcuh as I can for myself, be able to get around and be moving."  Wife states: " I hope he is mobile and needs some help with his colostomy care."  Pt reports; " The doctor says he got it all." He is optimistic regarding his recovery.  Community Resources [employer] Scripts: None Premorbid Home Care/DME Agencies: None Transportation available at discharge: Wife Resource referrals recommended: Support group (specify) (Colostomy Support gorup)  Discharge Planning Living Arrangements: Spouse/significant other Support Systems: Spouse/significant other;Other relatives;Friends/neighbors;Church/faith community Type of Residence: Private residence Insurance Resources: Education officer, museum (specify) Human resources officer) Museum/gallery curator Resources: Fish farm manager;Family Support Financial Screen Referred: No Living Expenses: Own Money Management: Spouse;Patient Does the patient have any problems obtaining your medications?: No Home Management: Wife Patient/Family Preliminary Plans: Return home with wife who can provide some light assist, due  to her own back issues.  Pt aware he will be a short length of stay and is hopeful he will achieve his goals by this time.  Much education needs to be completed regarding his colostomy care, wound vac to be discharged before going home. Social Work Anticipated Follow Up Needs: HH/OP;Support Group DC Planning Additional Notes/Comments: Much family educaiton needs to be completed prior to discharge home.  Clinical Impression Pleasant motivated gentleman who is willing to work in therapies and is aware much needs to be done prior to discharge home.  Will check to see if wound vac is needed at home and will need to begin process. Team target of 7-10 days length of stay, will ask RN to begin teaching with pt and wife.  Work on discharge plans.  Elease Hashimoto 03/04/2013, 11:10 AM

## 2013-03-04 NOTE — IPOC Note (Signed)
Overall Plan of Care [facility]) Patient Details Name: Larry [name] MRN: [id number] DOB: [birth year]  Admitting Diagnosis: DECONDITIONED   Hospital Problems: Active Problems:   Physical deconditioning     Functional Problem List: Nursing Endurance;Medication Management;Pain;Safety;Skin Integrity  PT Balance;Endurance;Pain;Safety  OT Balance;Edema;Endurance;Motor;Pain;Skin Integrity  SLP    TR         Basic ADL's: OT Grooming;Bathing;Dressing;Toileting     Advanced  ADL's: OT Simple Meal Preparation     Transfers: PT Bed Mobility;Bed to Chair;Car;Furniture  OT Toilet     Locomotion: PT Ambulation     Additional Impairments: OT None  SLP        TR      Anticipated Outcomes Item Anticipated Outcome  Self Feeding no goal  Swallowing      Basic self-care  mod I  Toileting  mod I    Bathroom Transfers mod I   Bowel/Bladder  Remain continent of bowel and bladder  Transfers  S/Mod-I   Locomotion  S/Mod-I using LRAD x 300'  Communication     Cognition     Pain  Rate pain <3/10  Safety/Judgment  Mod I assist   Therapy Plan: PT Intensity: Minimum of 1-2 x/day ,45 to 90 minutes PT Frequency: 5 out of 7 days PT Duration Estimated Length of Stay: 7-10 days OT Intensity: Minimum of 1-2 x/day, 45 to 90 minutes OT Frequency: 5 out of 7 days OT Duration/Estimated Length of Stay: 7-8         Team Interventions: Nursing Interventions Patient/Family Education;Pain Management;Skin Care/Wound Management;Medication Management  PT interventions Ambulation/gait training;Balance/vestibular training;DME/adaptive equipment instruction;Functional mobility training;Pain management;Patient/family education;Therapeutic Activities;Therapeutic Exercise;UE/LE Strength taining/ROM  OT Interventions Balance/vestibular training;Community reintegration;Discharge planning;DME/adaptive equipment instruction;Functional mobility training;Psychosocial support;Self Care/advanced ADL  retraining;Patient/family education;Therapeutic Exercise;UE/LE Coordination activities;UE/LE Strength taining/ROM  SLP Interventions    TR Interventions    SW/CM Interventions      Team Discharge Planning: Destination: PT-Home ,OT- Home , SLP-  Projected Follow-up: PT-Home health PT, OT-  None, SLP-  Projected Equipment Needs: PT- , OT-  , SLP-  Equipment Details: PT-appropriate assistive device for discharge to home is to be determined as patient progresses. PLOF: Independent gait, OT-  Patient/family involved in discharge planning: PT- Patient,  OT-Patient, SLP-   MD ELOS: 7-10 days Medical Rehab Prognosis:  Good Assessment: 63 y.o. male with history of HTN, NSVT, COPD, admitted 02/02/13 with 5 week history of progressive weakness, constipation x 2 weeks and difficulty eating or drinking. He was found to have acute renal failure with Cr -7.83, anemia 7.3 as well as obstructing colon mass. Colonoscopy with biopsy revealed adenocarcinoma and patient underwent Exp lap with partial colectomy with colostomy on 12/23 by Dr. Hulen Skains. Abdominal wound closure with VAC placement on 02/18/13. On 02/24/13, patient with V fib cardiac arrest and CPR initiated and patient shocked with subsequent A fib with RVR. Electrolyte abnormality treated and 2D echo with EF 40-45% with periapical akinesis and biatrial enlargement. Patient with recurrent torsades on 01/05 and shocked in NSR. Wound healing well and will not need VAC per [facility]CS.  Now requiring 24/7 Rehab RN,MD, as well as CIR level PT, OT and SLP.  Treatment team will focus on ADLs and mobility with goals set at Mod I   See Team Conference Notes for weekly updates to the plan of care

## 2013-03-04 NOTE — Progress Notes (Addendum)
Physical Therapy Note  Patient Details  Name: HARWOOD NALL MRN: [id number] Date of Birth: 1950/12/14 Today's Date: 03/04/2013 1100-1200, 60 min individual  1300-1335, 35 min individual  tx 1:  Pain abdominal wound 5/10; premedicated  Therapeutic exercise performed with LE to increase strength for functional mobility: 10 x 1 each seated calf raises, hip flexion; Fall Prevention I exs with 4# L and R hip abduction, 0# for mini squats, hamstring curls and toes up/down; beach ball toss/catch requiring overhead reach; self stretching bil hamstrings and heel cords in sitting, using foot stool.; agility ex in sitting- alternating simultaneous stepping; reaching within BOS in sitting activating trunk muscles for shortening/lengthening.  Gait with RW x 170' x 2 , x 140' on level tile and carpet, min guard /S, cues for upright posture and forward gaze, wider BOS; with would vac unit on seat of w/c brought by PT.  Pt requested resting in recliner in room before starting lunch.  All needs left within reach.  tx 2:  Pain rated 5/10; RN aware  Gait to/from room x 150' with RW with supervision, with cues as above for posture.  Sit>< stand x 7 focusing on wt shifting, without use of UEs, to bias effort to LEs.  Balance retraining on compliant Airex mat during M-L wt shifting with min guard> supervision; and A-P with mod> min assist.  Ankle, hip strategies noted bil, both delayed.  HR 92; O2 sats 97% during ex.  Noble Bodie 03/04/2013, 11:15 AM

## 2013-03-04 NOTE — Progress Notes (Signed)
Subjective/Complaints: 63 y.o. male with history of HTN, NSVT, COPD, admitted 02/02/13 with 5 week history of progressive weakness, constipation x 2 weeks and difficulty eating or drinking. He was found to have acute renal failure with Cr -7.83, anemia 7.3 as well as obstructing colon mass. Colonoscopy with biopsy revealed adenocarcinoma and patient underwent Exp lap with partial colectomy with colostomy on 12/23 by Dr. Hulen Skains. Abdominal wound closure with VAC placement on 02/18/13. On 02/24/13, patient with V fib cardiac arrest and CPR initiated and patient shocked with subsequent A fib with RVR. Electrolyte abnormality treated and 2D echo with EF 40-45% with periapical akinesis and biatrial enlargement. Patient with recurrent torsades on 01/05 and shocked in NSR. Wound healing well and will not need VAC per CCS.  No new issues overnite Some midline abd pain  Review of Systems - Negative except abd pain along incision  Objective: Vital Signs: Blood pressure 124/73, pulse 84, temperature 98.1 F (36.7 C), temperature source Oral, resp. rate 20, height 5' 10"  (1.778 m), weight 129.9 kg (286 lb 6 oz), SpO2 100.00%. No results found. Results for orders placed during the hospital encounter of 03/01/13 (from the past 72 hour(s))  MAGNESIUM     Status: None   Collection Time    03/02/13  5:00 AM      Result Value Range   Magnesium 1.9  1.5 - 2.5 mg/dL  BASIC METABOLIC PANEL     Status: Abnormal   Collection Time    03/04/13  3:31 AM      Result Value Range   Sodium 139  137 - 147 mEq/L   Potassium 3.9  3.7 - 5.3 mEq/L   Chloride 103  96 - 112 mEq/L   CO2 24  19 - 32 mEq/L   Glucose, Bld 100 (*) 70 - 99 mg/dL   BUN 10  6 - 23 mg/dL   Creatinine, Ser 1.24  0.50 - 1.35 mg/dL   Calcium 9.1  8.4 - 10.5 mg/dL   GFR calc non Af Amer 62 (*) >90 mL/min   GFR calc Af Amer 71 (*) >90 mL/min   Comment: (NOTE)     The eGFR has been calculated using the CKD EPI equation.     This calculation has not  been validated in all clinical situations.     eGFR's persistently <90 mL/min signify possible Chronic Kidney     Disease.  CBC     Status: Abnormal   Collection Time    03/04/13  3:31 AM      Result Value Range   WBC 4.5  4.0 - 10.5 K/uL   RBC 2.92 (*) 4.22 - 5.81 MIL/uL   Hemoglobin 8.4 (*) 13.0 - 17.0 g/dL   HCT 26.6 (*) 39.0 - 52.0 %   MCV 91.1  78.0 - 100.0 fL   MCH 28.8  26.0 - 34.0 pg   MCHC 31.6  30.0 - 36.0 g/dL   RDW 18.2 (*) 11.5 - 15.5 %   Platelets 207  150 - 400 K/uL     HEENT: normal Cardio: irregular Resp: CTA B/L and unlabored GI: BS positive and non distended Extremity:  Pulses positive and No Edema Skin:   Wound midline wound wac mild tenderness along edges, RLQ colostomy draining well, ichthyosis BLE feet and Other dry skin B LE Neuro: Alert/Oriented, Normal Sensory, Abnormal Motor 4/5 BUE and BLE and Normal FMC Musc/Skel:  Normal GEN NAD   Assessment/Plan: 1. Functional deficits secondary to Deconditioning due to adenocarcinoma  of colon  which require 3+ hours per day of interdisciplinary therapy in a comprehensive inpatient rehab setting. Physiatrist is providing close team supervision and 24 hour management of active medical problems listed below. Physiatrist and rehab team continue to assess barriers to discharge/monitor patient progress toward functional and medical goals. FIM: FIM - Bathing Bathing Steps Patient Completed: Chest;Right Arm;Left Arm;Abdomen;Front perineal area;Right upper leg;Left upper leg;Right lower leg (including foot);Left lower leg (including foot) Bathing: 5: Supervision: Safety issues/verbal cues  FIM - Upper Body Dressing/Undressing Upper body dressing/undressing steps patient completed: Thread/unthread left sleeve of pullover shirt/dress;Thread/unthread right sleeve of pullover shirt/dresss;Put head through opening of pull over shirt/dress;Pull shirt over trunk Upper body dressing/undressing: 5: Set-up assist to: Obtain  clothing/put away FIM - Lower Body Dressing/Undressing Lower body dressing/undressing steps patient completed: Pull pants up/down Lower body dressing/undressing: 1: Total-Patient completed less than 25% of tasks        FIM - Bed/Chair Transfer Bed/Chair Transfer: 4: Supine > Sit: Min A (steadying Pt. > 75%/lift 1 leg);3: Sit > Supine: Mod A (lifting assist/Pt. 50-74%/lift 2 legs);4: Bed > Chair or W/C: Min A (steadying Pt. > 75%);4: Chair or W/C > Bed: Min A (steadying Pt. > 75%)  FIM - Locomotion: Wheelchair Locomotion: Wheelchair: 0: Activity did not occur FIM - Locomotion: Ambulation Locomotion: Ambulation Assistive Devices: Administrator Ambulation/Gait Assistance: 4: Min guard Locomotion: Ambulation: 4: Travels 150 ft or more with minimal assistance (Pt.>75%)  Comprehension Comprehension Mode: Auditory Comprehension: 5-Follows basic conversation/direction: With no assist  Expression Expression Mode: Verbal Expression: 5-Expresses complex 90% of the time/cues < 10% of the time  Social Interaction Social Interaction: 5-Interacts appropriately 90% of the time - Needs monitoring or encouragement for participation or interaction.  Problem Solving Problem Solving: 5-Solves complex 90% of the time/cues < 10% of the time  Memory Memory: 7-Complete Independence: No helper  Medical Problem List and Plan:  1. DVT Prophylaxis/Anticoagulation: Mechanical: Antiembolism stockings, knee (TED hose) Bilateral lower extremities  Sequential compression devices, below knee Bilateral lower extremities  2. Pain Management: Prn medications effective for abdominal pain.  3. Mood: Provide ego support. LCSW to follow for evaluation and support.  4. Neuropsych: This patient is capable of making decisions on his own behalf.  5. Torsades/ A Fib: Continue coreg bid with Kdur and Mag ox as supplement for electrolyte abnormality. Recommendations to keep Mg > 2.0. Recheck lytes Monday.  6. Acute on  chronic CHF: Continue aldactone and coreg. Monitor daily weights.  7. Colon cancer s/p colon RSXN:  - Add protein supplement to help promote wound healing.  -Reports poor appetite--dietary consult to help with food choices.  -ostomy care/mgt education  8. Wound care: continue with current vac  -may be able to remove soon and initiate standard wet to dry dressing as it is closing 9.  Anemia related to adenoca of colon  LOS (Days) 3 A FACE TO FACE EVALUATION WAS PERFORMED  Markela Wee E 03/04/2013, 7:32 AM

## 2013-03-04 NOTE — Progress Notes (Signed)
Patient information reviewed and entered into eRehab system by Nijah Orlich, RN, CRRN, PPS Coordinator.  Information including medical coding and functional independence measure will be reviewed and updated through discharge.     Per nursing patient was given "Data Collection Information Summary for Patients in Inpatient Rehabilitation Facilities with attached "Privacy Act Statement-Health Care Records" upon admission.  

## 2013-03-04 NOTE — Progress Notes (Signed)
Occupational Therapy Session Note  Patient Details  Name: Larry [name] MRN: [id number] Date of Birth: 1950/10/11  Today's Date: 03/04/2013 Time: 0900-1000 Time Calculation (min): 60 min  Short Term Goals: No short term goals set  Skilled Therapeutic Interventions/Progress Updates:      Pt seen for BADL retraining of toileting, bathing, and dressing with a focus on activity tolerance. Pt demonstrated much improved strength and endurance in that he was able to get out of bed, stand, walk with RW to toilet and back to sink, stand for 15 minutes at a time at the sink all with supervision. He has difficulty reaching feet to bathe them, and difficulty with socks. He stated that his wife used to assist him with those tasks.  Pt resting in recliner at end of session with call light in reach.  Therapy Documentation Precautions:  Precautions Precautions: Fall Precaution Comments: colostomy, would VAC, abdominal binder Restrictions Weight Bearing Restrictions: No     Pain: Pain Assessment Pain Assessment: 0-10 Pain Score: 2  Pain Type: Surgical pain Pain Location: Abdomen Pain Orientation: Anterior;Mid Pain Descriptors / Indicators: Aching;Sore Pain Frequency: Intermittent Pain Onset: Gradual Patients Stated Pain Goal: 2 Pain Intervention(s): Medication (See eMAR) Multiple Pain Sites: No  ADL:  See FIM for current functional status  Therapy/Group: Individual Therapy  Riddhi Grether 03/04/2013, 11:57 AM

## 2013-03-04 NOTE — Progress Notes (Signed)
Occupational Therapy Session Note  Patient Details  Name: Larry [name] MRN: [id number] Date of Birth: [birth year]  Today's Date: 03/04/2013 Time: 1350-1430 Time Calculation (min): 40 min  Short Term Goals: Week 1:  OT Short Term Goal 1 (Week 1): STG=LTG  Skilled Therapeutic Interventions/Progress Updates:  Upon entering room, patient found seated in recliner. Therapist discussed d/c planning and home set-up with patient. From here, patient stood with RW and ambulated a couple feet > w/c; therapist managed wound vac. From here, patient propelled self > therapy gym. In gym, focused skilled intervention on multiple sit<>stands, air squats, overall activity tolerance/endurance, and education on BUE HEP using theraband. Patient with decreased overall activity tolerance/endurance during session. Patient ambulated back to room with supervision and left seated in recliner with all needed items within reach.   Precautions:  Precautions Precautions: Fall Precaution Comments: colostomy, would VAC, abdominal binder Restrictions Weight Bearing Restrictions: No  See FIM for current functional status  Therapy/Group: Individual Therapy  Keondria Siever 03/04/2013, 2:38 PM

## 2013-03-05 ENCOUNTER — Inpatient Hospital Stay (HOSPITAL_COMMUNITY): Payer: Medicare Other | Admitting: Occupational Therapy

## 2013-03-05 ENCOUNTER — Inpatient Hospital Stay (HOSPITAL_COMMUNITY): Payer: Medicare Other

## 2013-03-05 DIAGNOSIS — R5381 Other malaise: Secondary | ICD-10-CM

## 2013-03-05 LAB — URINE CULTURE
Colony Count: NO GROWTH
Culture: NO GROWTH

## 2013-03-05 NOTE — Progress Notes (Signed)
Occupational Therapy Session Note  Patient Details  Name: Larry [name] MRN: [id number] Date of Birth: July 23, 1950  Today's Date: 03/05/2013 Time: 1105-1150 Time Calculation (min): 45 min  Short Term Goals: Week 1:  OT Short Term Goal 1 (Week 1): STG=LTG  Skilled Therapeutic Interventions/Progress Updates:  Patient with complaints of soreness in abdomin; RN aware.  Upon entering room, patient found in recliner with RN present. Patient stood with RW and ambulated > dresser to gather clothes for dressing, draping clothes over walker . Therapist educated patient on RW safety; do not hold anything in hands when walking with RW. Patient completed UB/LB bathing in sit<>stand position at sink. Patient then attempted LB dressing in standing position, therapist recommended patient sit for safety and as an energy conservation technique. Patient stood to [employer] with supervision. From here patient ambulated back to recliner. Therapist educated patient on AE (reacher, sock aid, long handled shoe horn and long handled sponge) for socks & shoes and dressing in order to increase independence with LB bathing & dressing. At end of session, left patient seated in recliner with BLEs elevated and all needed items within reach.   Precautions:  Precautions Precautions: Fall Precaution Comments: colostomy, would VAC, abdominal binder Restrictions Weight Bearing Restrictions: No  See FIM for current functional status  Therapy/Group: Individual Therapy  Maan Zarcone 03/05/2013, 11:57 AM

## 2013-03-05 NOTE — Patient Care Conference (Signed)
Inpatient RehabilitationTeam Conference and Plan of Care Update Date: 03/06/2013   Time: 10;30 AM    Patient Name: Larry [name]      Medical Record Number: [id number]  Date of Birth: 11-11-1950 Sex: Male         Room/Bed: 4M07C/4M07C-01 Payor Info: Payor: [place] / Plan: [employer] PPO / Product Type: *No Product type* /    Admitting Diagnosis: DECONDITIONED   Admit Date/Time:  03/01/2013  7:12 PM Admission Comments: No comment available   Primary Diagnosis:  <principal problem not specified> Principal Problem: <principal problem not specified>  Patient Active Problem List   Diagnosis Date Noted  . Physical deconditioning 03/01/2013  . Torsades de pointes 02/27/2013  . Acute on chronic diastolic heart failure 99/24/2683  . Colon cancer 02/09/2013  . Atrial fibrillation 02/05/2013  . Acute renal failure 02/02/2013  . Hypotension 02/02/2013  . Chronic blood loss anemia 02/02/2013  . Edema 06/16/2011  . Tachycardia 06/16/2011  . IMPOTENCE OF ORGANIC ORIGIN 01/12/2009  . SLEEP APNEA, OBSTRUCTIVE 12/04/2008  . HYPERLIPIDEMIA 12/03/2008  . OBESITY 12/03/2008  . CAD 12/03/2008  . CARDIOMYOPATHY, ISCHEMIC 12/03/2008  . ATRIAL TACHYCARDIA 12/03/2008  . ASTHMA 12/03/2008  . C O P D 12/03/2008  . HYPERTENSION 12/02/2008    Expected Discharge Date: Expected Discharge Date: 03/07/13  Team Members Present: Physician leading conference: Dr. Alysia Penna Social Worker Present: Ovidio Kin, LCSW Nurse Present: Other (comment) Maree Erie) PT Present: Georjean Mode, PT;Other (comment) Jilda Roche) OT Present: Gareth Morgan, Starling Manns, OT SLP Present: Germain Osgood, SLP PPS Coordinator present : Daiva Nakayama, RN, CRRN     Current Status/Progress Goal Weekly Team Focus  Medical   off vac, has colostomy, Wet/dry dressing  colostomy care  family training wound care   Bowel/Bladder   continent of bladder, colostomy intact   Continent of bladder; pt and wife will each be able to perform colostomy care  educate patient and wife on daily colostomy care   Swallow/Nutrition/ Hydration     WFL        ADL's   mod I with AE  mod I  ready for discharge   Mobility      S overall    Ready for discharge  Communication     [facility]        Safety/Cognition/ Behavioral Observations    No safety concerns        Pain   denies  </=3  monitor q shift   Skin   midline incision w/ daily dressing change; colostomy intact to RLQ  No new skin breakdown; incision free of infection  continue w/ daiily dressing changes, monitor for skin area around stoma; educate wife and patient on s/s of infection and keeping wound clean at home.    Rehab Goals Patient on target to meet rehab goals: Yes *See Care Plan and progress notes for long and short-term goals.  Barriers to Discharge: see above    Possible Resolutions to Barriers:  see above D/C in am    Discharge Planning/Teaching Needs:  Home with wife who can assist with light min level, she has back issues.  Learning his colostomy care-begun 1/13.      Team Discussion:  Pt at goal level-wife coming in for education of colostomy and dressing changes today, along with therapy education. Will have home health follow up at home  Revisions to Treatment Plan:  D/C tomorrow   Continued Need for Acute Rehabilitation Level of Care: The  patient requires daily medical management by a physician with specialized training in physical medicine and rehabilitation for the following conditions: Daily direction of a multidisciplinary physical rehabilitation program to ensure safe treatment while eliciting the highest outcome that is of practical value to the patient.: Yes Daily medical management of patient stability for increased activity during participation in an intensive rehabilitation regime.: Yes Daily analysis of laboratory values and/or radiology reports with any subsequent need for  medication adjustment of medical intervention for : Other;Post surgical problems  Habib Kise, Gardiner Rhyme 03/06/2013, 1:55 PM

## 2013-03-05 NOTE — Consult Note (Signed)
WOC wound follow up Wound type: surgical wound/midline abdomen Measurement: 14cm x 2.5cm x 0.5cm  Wound bed:100% beefy, granulation tissue Drainage (amount, consistency, odor) moderate, serosanguinous in VAC canister Periwound: intact Called this am from the bedside nurse, when trying to change the [facility] dressing yesterday they were unable to remove the black foam as the granulation tissue was very adherent to the foam.  I recommended using saline to saturate the foam with saline and use gloved finger to bluntly dissect the foam away.  Once I arrived to the floor today, nursing had not yet attempted to remove foam.  Plans were to dc VAC at the time of discharge to home, so decided to go ahead with dc of VAC at this time to allow patient more freedom for participation in therapy. Staff had maintained moisture of the black foam since yesterday by covering the foam with saline moistened gauze dressing.  Today patient was premedicated prior to my visit.  I was able to saturate the foam extensively and then use my finger to bluntly dissect the foam away from the wound bed.  Pt tolerated with some discomfort. Applied saline moist gauze and cover with dry dressing, secured with tape. I will change to hydrogel daily to allow for better moist wound healing and less frequent dressing changes.  Bedside nurse to order supplies and will start hydrogel with pm dressing today.   Noted ostomy pouch intact and ostomy is functioning.  Will coordinate with wife to have her perform next pouch change tom if possible.   Pt in agreement with this plan Taleia Sadowski Liane Comber RN,CWOCN [phone]

## 2013-03-05 NOTE — Progress Notes (Signed)
Physical Therapy Note  Patient Details  Name: Larry [name] MRN: [id number] Date of Birth: Nov 10, 1950 Today's Date: 03/05/2013          0810-0910, 60 min individual therapy 1015-1100, 45 min individual therapy 1445-1530, 45 min individual therapy  tx 1:  No pain reported  With HOB elevated, supine> sit with supervision.   Gait to/from room x 156,m x 170' ' with RW, supervision; cues for wider BOS and decreased reliance on bil Ues with RW. Up/down 5 steps 2 rails with min guard assist, VCs for sequencing, without knees buckling.  Advanced gait stepping over canes on floor, supervision, cues for technique..  Therapeutic exercise performed with bil LEs, bil UEs to increase strength for functional mobility:  Seated 10 x 1 each long arc knee ext, marching, ankle pumps, resisted hip abd/add; with 8# bar, biceps curls, should retraction, PNF 1 diagonals.  Standing Fall Prevention I exs: 10 x 1 each 4# bil for hip abduction, mini squats, calf raises/toe raises.  Pt required seated rest break after each standing ex.  Toilet transfer to stand to urinate, supervision.  Tx 2:  Pain rated 4/10 abdominal, surgical pain, premedicated  In flat bed, without rail, supine > sit with supervision, cues for tehnique.  Gait x 170' x 2 with RW, cues as above.  Simulated car transfer to SUV height seat, supervision, cues to clear LEs.  NuStep at level 4, x 10 minutes, rated 13 on Borg scale; HR 91, O2 sats 98%.  tx 3:  Sit> stand from recliner required 2 attempts, without using UEs.    Gait with RW, supervision, in room to toilet as above. Gait up/down curb with RW, x 2, min guard> supervision, to simulate home entry.  Wife later stated that she is not sure doorway will acommodate RW; she will check tonight.  Use of incentive inspirometer, x 5, up to  1250 ml max.  Pt with congested, tight sounding cough this session.  Dynamic sitting balance with feet unsupported, reaching out of BOS with  R and L hands, requiring trunk shortening/lengthening, without LOB.  Ball toss/catch with feet unsupported, reaching in all directions, with frequenct LOB backwards, recovered independently.  Dynamic standing balance on compliant Airex mat, performing wt shifting, toes up/down with mod assist for balance.  Hip and ankle strategies continue to be sluggish.  Fall Prevention I ex: standing hamstring curls, 10 x 1 each, without weights, bi UE support.  Unilateral stance with 1UE support, during kicking with L or R, no knee buckling.  Wife will attend OT and PT tomorrow for family ed.  Evamarie Raetz 03/05/2013, 7:55 AM

## 2013-03-05 NOTE — Progress Notes (Signed)
Subjective/Complaints: 63 y.o. male with history of HTN, NSVT, COPD, admitted 02/02/13 with 5 week history of progressive weakness, constipation x 2 weeks and difficulty eating or drinking. He was found to have acute renal failure with Cr -7.83, anemia 7.3 as well as obstructing colon mass. Colonoscopy with biopsy revealed adenocarcinoma and patient underwent Exp lap with partial colectomy with colostomy on 12/23 by Dr. Hulen Skains. Abdominal wound closure with VAC placement on 02/18/13. On 02/24/13, patient with V fib cardiac arrest and CPR initiated and patient shocked with subsequent A fib with RVR. Electrolyte abnormality treated and 2D echo with EF 40-45% with periapical akinesis and biatrial enlargement. Patient with recurrent torsades on 01/05 and shocked in NSR. Wound healing well and will not need VAC per CCS.  No new issues overnite Difficulty removing wound vac, requiring NS soaks  Review of Systems - Negative except abd pain along incision  Objective: Vital Signs: Blood pressure 132/66, pulse 82, temperature 97.8 F (36.6 C), temperature source Oral, resp. rate 18, height $RemoveBe'5\' 10"'OINAFlnDj$  (1.778 m), weight 129.9 kg (286 lb 6 oz), SpO2 100.00%. No results found. Results for orders placed during the hospital encounter of 03/01/13 (from the past 72 hour(s))  BASIC METABOLIC PANEL     Status: Abnormal   Collection Time    03/04/13  3:31 AM      Result Value Range   Sodium 139  137 - 147 mEq/L   Potassium 3.9  3.7 - 5.3 mEq/L   Chloride 103  96 - 112 mEq/L   CO2 24  19 - 32 mEq/L   Glucose, Bld 100 (*) 70 - 99 mg/dL   BUN 10  6 - 23 mg/dL   Creatinine, Ser 1.24  0.50 - 1.35 mg/dL   Calcium 9.1  8.4 - 10.5 mg/dL   GFR calc non Af Amer 62 (*) >90 mL/min   GFR calc Af Amer 71 (*) >90 mL/min   Comment: (NOTE)     The eGFR has been calculated using the CKD EPI equation.     This calculation has not been validated in all clinical situations.     eGFR's persistently <90 mL/min signify possible  Chronic Kidney     Disease.  CBC     Status: Abnormal   Collection Time    03/04/13  3:31 AM      Result Value Range   WBC 4.5  4.0 - 10.5 K/uL   RBC 2.92 (*) 4.22 - 5.81 MIL/uL   Hemoglobin 8.4 (*) 13.0 - 17.0 g/dL   HCT 26.6 (*) 39.0 - 52.0 %   MCV 91.1  78.0 - 100.0 fL   MCH 28.8  26.0 - 34.0 pg   MCHC 31.6  30.0 - 36.0 g/dL   RDW 18.2 (*) 11.5 - 15.5 %   Platelets 207  150 - 400 K/uL  URINALYSIS, ROUTINE W REFLEX MICROSCOPIC     Status: Abnormal   Collection Time    03/04/13 10:20 AM      Result Value Range   Color, Urine YELLOW  YELLOW   APPearance HAZY (*) CLEAR   Specific Gravity, Urine 1.006  1.005 - 1.030   pH 6.0  5.0 - 8.0   Glucose, UA NEGATIVE  NEGATIVE mg/dL   Hgb urine dipstick NEGATIVE  NEGATIVE   Bilirubin Urine NEGATIVE  NEGATIVE   Ketones, ur NEGATIVE  NEGATIVE mg/dL   Protein, ur NEGATIVE  NEGATIVE mg/dL   Urobilinogen, UA 0.2  0.0 - 1.0 mg/dL   Nitrite  NEGATIVE  NEGATIVE   Leukocytes, UA TRACE (*) NEGATIVE  URINE MICROSCOPIC-ADD ON     Status: None   Collection Time    03/04/13 10:20 AM      Result Value Range   Squamous Epithelial / LPF RARE  RARE   WBC, UA 3-6  <3 WBC/hpf     HEENT: normal Cardio: irregular Resp: CTA B/L and unlabored GI: BS positive and non distended Extremity:  Pulses positive and No Edema Skin:   Wound midline wound wac mild tenderness along edges, RLQ colostomy draining well, ichthyosis BLE feet and Other dry skin B LE Neuro: Alert/Oriented, Normal Sensory, Abnormal Motor 4/5 BUE and BLE and Normal FMC Musc/Skel:  Normal GEN NAD   Assessment/Plan: 1. Functional deficits secondary to Deconditioning due to adenocarcinoma of colon  which require 3+ hours per day of interdisciplinary therapy in a comprehensive inpatient rehab setting. Physiatrist is providing close team supervision and 24 hour management of active medical problems listed below. Physiatrist and rehab team continue to assess barriers to discharge/monitor  patient progress toward functional and medical goals. FIM: FIM - Bathing Bathing Steps Patient Completed: Chest;Right Arm;Left Arm;Abdomen;Front perineal area;Right upper leg;Left upper leg;Buttocks Bathing: 4: Min-Patient completes 8-9 76f 10 parts or 75+ percent  FIM - Upper Body Dressing/Undressing Upper body dressing/undressing steps patient completed: Thread/unthread left sleeve of pullover shirt/dress;Thread/unthread right sleeve of pullover shirt/dresss;Put head through opening of pull over shirt/dress;Pull shirt over trunk Upper body dressing/undressing: 5: Set-up assist to: Obtain clothing/put away FIM - Lower Body Dressing/Undressing Lower body dressing/undressing steps patient completed: Thread/unthread right underwear leg;Thread/unthread left underwear leg;Pull underwear up/down;Thread/unthread right pants leg;Thread/unthread left pants leg;Pull pants up/down Lower body dressing/undressing: 3: Mod-Patient completed 50-74% of tasks  FIM - Toileting Toileting steps completed by patient: Adjust clothing prior to toileting;Performs perineal hygiene;Adjust clothing after toileting (stands at toilet) Toileting: 5: Supervision: Safety issues/verbal cues  FIM - Archivist Transfers Assistive Devices: Art gallery manager Transfers: 5-To toilet/BSC: Supervision (verbal cues/safety issues);5-From toilet/BSC: Supervision (verbal cues/safety issues) (stands at toilet)  FIM - Bed/Chair Transfer Bed/Chair Transfer: 5: Chair or W/C > Bed: Supervision (verbal cues/safety issues);5: Bed > Chair or W/C: Supervision (verbal cues/safety issues)  FIM - Locomotion: Wheelchair Locomotion: Wheelchair: 1: Total Assistance/staff pushes wheelchair (Pt<25%) FIM - Locomotion: Ambulation Locomotion: Ambulation Assistive Devices: Designer, industrial/product Ambulation/Gait Assistance: 4: Min guard Locomotion: Ambulation: 4: Travels 150 ft or more with minimal assistance (Pt.>75%)  Comprehension Comprehension  Mode: Auditory Comprehension: 5-Follows basic conversation/direction: With no assist  Expression Expression Mode: Verbal Expression: 5-Expresses complex 90% of the time/cues < 10% of the time  Social Interaction Social Interaction: 5-Interacts appropriately 90% of the time - Needs monitoring or encouragement for participation or interaction.  Problem Solving Problem Solving: 5-Solves complex 90% of the time/cues < 10% of the time  Memory Memory: 7-Complete Independence: No helper  Medical Problem List and Plan:  1. DVT Prophylaxis/Anticoagulation: Mechanical: Antiembolism stockings, knee (TED hose) Bilateral lower extremities  Sequential compression devices, below knee Bilateral lower extremities  2. Pain Management: Prn medications effective for abdominal pain.  3. Mood: Provide ego support. LCSW to follow for evaluation and support.  4. Neuropsych: This patient is capable of making decisions on his own behalf.  5. Torsades/ A Fib: Continue coreg bid with Kdur and Mag ox as supplement for electrolyte abnormality. Recommendations to keep Mg > 2.0. Recheck lytes Monday.  6. Acute on chronic CHF: Continue aldactone and coreg. Monitor daily weights.  7. Colon cancer s/p colon  RSXN:  - Add protein supplement to help promote wound healing.  -Reports poor appetite--dietary consult to help with food choices.  -ostomy care/mgt education  8. Wound care: continue with current vac  As per CCS 9.  Anemia related to adenoca of colon, onc f/u as outpt  LOS (Days) 4 A FACE TO FACE EVALUATION WAS PERFORMED  Larry [name] 03/05/2013, 7:32 AM

## 2013-03-06 ENCOUNTER — Inpatient Hospital Stay (HOSPITAL_COMMUNITY): Payer: Medicare Other | Admitting: Occupational Therapy

## 2013-03-06 ENCOUNTER — Inpatient Hospital Stay (HOSPITAL_COMMUNITY): Payer: Medicare Other

## 2013-03-06 LAB — CBC
HCT: 27.6 % — ABNORMAL LOW (ref 39.0–52.0)
HEMOGLOBIN: 8.4 g/dL — AB (ref 13.0–17.0)
MCH: 27.9 pg (ref 26.0–34.0)
MCHC: 30.4 g/dL (ref 30.0–36.0)
MCV: 91.7 fL (ref 78.0–100.0)
Platelets: 228 10*3/uL (ref 150–400)
RBC: 3.01 MIL/uL — ABNORMAL LOW (ref 4.22–5.81)
RDW: 18 % — ABNORMAL HIGH (ref 11.5–15.5)
WBC: 5.1 10*3/uL (ref 4.0–10.5)

## 2013-03-06 MED ORDER — MAGNESIUM OXIDE 400 (241.3 MG) MG PO TABS: 400.0000 mg | ORAL_TABLET | Freq: Every day | ORAL | Status: DC

## 2013-03-06 MED ORDER — ADULT MULTIVITAMIN W/MINERALS CH: 1.0000 | ORAL_TABLET | Freq: Every day | ORAL | Status: DC

## 2013-03-06 MED ORDER — SIMETHICONE 80 MG PO CHEW: 80.0000 mg | CHEWABLE_TABLET | Freq: Four times a day (QID) | ORAL | Status: DC

## 2013-03-06 MED ORDER — ASPIRIN EC 81 MG PO TBEC: 81.0000 mg | DELAYED_RELEASE_TABLET | Freq: Every day | ORAL | Status: DC

## 2013-03-06 MED ORDER — POTASSIUM CHLORIDE CRYS ER 20 MEQ PO TBCR: 20.0000 meq | EXTENDED_RELEASE_TABLET | Freq: Two times a day (BID) | ORAL | Status: DC

## 2013-03-06 MED ORDER — PANTOPRAZOLE SODIUM 40 MG PO TBEC: 40.0000 mg | DELAYED_RELEASE_TABLET | Freq: Every day | ORAL | Status: DC

## 2013-03-06 MED ORDER — HYDROCODONE-ACETAMINOPHEN 5-325 MG PO TABS: 1.0000 | ORAL_TABLET | Freq: Four times a day (QID) | ORAL | Status: DC | PRN

## 2013-03-06 MED ORDER — CARVEDILOL 6.25 MG PO TABS
6.2500 mg | ORAL_TABLET | Freq: Two times a day (BID) | ORAL | Status: DC
Start: 2013-03-06 — End: 2013-07-08

## 2013-03-06 MED ORDER — FERROUS SULFATE 325 (65 FE) MG PO TABS: 325.0000 mg | ORAL_TABLET | Freq: Every day | ORAL | Status: DC

## 2013-03-06 MED ORDER — BENEPROTEIN PO POWD
1.0000 | Freq: Three times a day (TID) | ORAL | Status: DC
Start: 2013-03-06 — End: 2013-06-18

## 2013-03-06 MED ORDER — SPIRONOLACTONE 25 MG PO TABS: 25.0000 mg | ORAL_TABLET | Freq: Every day | ORAL | Status: DC

## 2013-03-06 NOTE — Progress Notes (Signed)
Physical Therapy Discharge Summary  Patient Details  Name: Larry [name] MRN: [id number] Date of Birth: 07/25/1950  Today's Date: 03/06/2013 Time: 1445-1530 Time Calculation (min): 45 min  Patient has met 10 of 10 long term goals due to improved activity tolerance, improved balance, improved postural control, decreased pain and ability to compensate for deficits.  Patient to discharge at an ambulatory level Modified Independent for household distances.  Supervision for curb, steps, car transfer.    Patient's care partner is independent to provide the necessary cognitive assistance at discharge.  Reasons goals not met: n/a  Recommendation:  Patient will benefit from ongoing skilled PT services in home health setting to continue to advance safe functional mobility, address ongoing impairments in activity tolerance, balance, strength/muscluar endurance, and minimize fall risk.  Equipment: No equipment provided  Reasons for discharge: treatment goals met and discharge from hospital  Patient/family agrees with progress made and goals achieved: Yes  PT Discharge Precautions/Restrictions Precautions Precautions: Fall Precaution Comments: colostomy Restrictions Weight Bearing Restrictions: No Vital Signs Therapy Vitals Temp: 98 F (36.7 C) Temp src: Oral Pulse Rate: 85 Resp: 20 BP: 129/70 mmHg Patient Position, if appropriate: Sitting Oxygen Therapy SpO2: 98 % O2 Device: None (Room air)  Pt requires seated rest breaks between most standing Fall Prevention I exs. Pain Pain Assessment Pain Score: 4  Pain Location: Abdomen Pain Intervention(s): RN made aware (declined pain meds) Vision/Perception  Vision - History Baseline Vision: Wears glasses only for reading Patient Visual Report: No change from baseline  Cognition Overall Cognitive Status: Within Functional Limits for tasks assessed Orientation Level: Oriented X4 Safety/Judgment: Appears  intact Sensation Sensation Light Touch: Appears Intact Stereognosis: Appears Intact Hot/Cold: Appears Intact Proprioception: Appears Intact Coordination Gross Motor Movements are Fluid and Coordinated: Yes Fine Motor Movements are Fluid and Coordinated: Yes Heel Shin Test: WNL Motor  Motor Motor: Within Functional Limits  Mobility Bed Mobility Bed Mobility:  (indendent for all, without rail, flat bed) Transfers Transfers: Yes Stand Pivot Transfers: 6: Modified independent (Device/Increase time) Locomotion  Ambulation Ambulation/Gait Assistance: 6: Modified independent (Device/Increase time) (supervision 150') Ambulation Distance (Feet): 50 Feet Assistive device: Rolling walker Gait Gait: Yes Gait Pattern: Impaired Gait Pattern: Decreased trunk rotation (bil shoulder elevation and limited head movements) Gait velocity: slow, but able to increase slightly with cues High Level Ambulation High Level Ambulation: Side stepping;Backwards walking Side Stepping: S, with RW Backwards Walking: S, with RW Stairs / Additional Locomotion Stairs: Yes Stairs Assistance: 5: Supervision Stair Management Technique: Two rails Number of Stairs: 5 Height of Stairs: 7 Curb: 5: Supervision (without AD; to simulate threshold entry into home) Wheelchair Mobility Wheelchair Mobility: No  Trunk/Postural Assessment  Cervical Assessment Cervical Assessment: Within Functional Limits Thoracic Assessment Thoracic Assessment: Within Functional Limits Lumbar Assessment Lumbar Assessment: Within Functional Limits Postural Control Postural Control: Within Functional Limits  Balance Balance Balance Assessed: Yes Static Sitting Balance Static Sitting - Balance Support: Feet unsupported Static Sitting - Level of Assistance: 7: Independent Dynamic Sitting Balance Dynamic Sitting - Balance Support: Feet supported Dynamic Sitting - Level of Assistance: 7: Independent Static Standing Balance Static  Standing - Balance Support: During functional activity Static Standing - Level of Assistance: 7: Independent Dynamic Standing Balance Dynamic Standing - Balance Support: During functional activity Dynamic Standing - Level of Assistance: 6: Modified independent (Device/Increase time) Extremity Assessment      RLE Assessment RLE Assessment: Within Functional Limits LLE Assessment LLE Assessment: Within Functional Limits  See FIM for current functional status  Larry [name]  03/06/2013, 4:09 PM

## 2013-03-06 NOTE — Progress Notes (Addendum)
Physical Therapy Note  Patient Details  Name: [name] MRN: [id number] Date of Birth: 08-Nov-1950 Today's Date: 03/06/2013  0900-1000 60 min individual therapy 1445-1530 45 min individual therapy  tx 1:  Pain abdomen rated 4/10; pt declined pain meds  Supine > sit in flat bed without rails, supervision.  Gait in room with RW to/from toilet and sink, supervision.  Pt backed up and turned fast in the BR, forgetting about floor elevation change.  No LOB or stumble.   gait sideways L and R and backwards x 10' , with RW and with HHA; no LOB.  Therapeutic exercise performed with bil UEs and trunk,  to increase strength for functional mobility: with 7# bar, DI diagonal PNF pattern in supported sitting. Kinetron in high sitting, resistance 20 cm/second, x 5 minutes, rated 13 on Borg scale.  Gait on level tile and carpet x 300' with supervision, with one brief standing rest break, RW, supervision. Gait without AD up/down threshold ht step, using door frame for 1UE support, to simulate home entry, safely with supervision.  Pt upgraded to modified independent in room with RW; safety plan modified.  LTG for basic transfers and home gait upgraded to modified independent; distance decreased to 50'; stair LTG added for home entry and community access. D/c planned for tomorrow, after wife is trained in colostomy care.  Tx 2:  Wife present for family ed for: gait on level terrain, 1 step without rail or RW for home entry, 5 steps with 2 rails for community access, simulated car transfer to SUV height seat, HEP, simulated step up into home shower stall. Pt and wife reported that he wears heel-less slippers at home; therapist discussed unsafe need for him to shuffle to keep this type of slippers on; therapist recommended shoe-type slippers instead.  Discussed life long fitness; silver sneakers programs at [facility], etc.  Gait in home setting in room, modified independent.  Up/down threshold  without use of RW, pulling with L hand on door frame, supervision/set up of RW into home.   Fall Prevention I exs with 4# L and R for seated knee ext and standing hip abd; otherwise without weights. Pt's performance of hamstring curls has improved.  Demonstrated how pt would use RW to step into shower stall. Therapist recommended that pt wait until [place] or [place] works with him on this.  Up/down 5 steps, 2 rails, step through pattern, supervision.  Kristiann Noyce 03/06/2013, 9:24 AM

## 2013-03-06 NOTE — Progress Notes (Signed)
Subjective/Complaints: 63 y.o. male with history of HTN, NSVT, COPD, admitted 02/02/13 with 5 week history of progressive weakness, constipation x 2 weeks and difficulty eating or drinking. He was found to have acute renal failure with Cr -7.83, anemia 7.3 as well as obstructing colon mass. Colonoscopy with biopsy revealed adenocarcinoma and patient underwent Exp lap with partial colectomy with colostomy on 12/23 by Dr. Hulen Skains.18/18 nodes negative, liver MRI neg for mets Abdominal wound closure with VAC placement on 02/18/13. On 02/24/13, patient with V fib cardiac arrest and CPR initiated and patient shocked with subsequent A fib with RVR. Electrolyte abnormality treated and 2D echo with EF 40-45% with periapical akinesis and biatrial enlargement. Patient with recurrent torsades on 01/05 and shocked in NSR. Wound healing well and will not need VAC per CCS.  No new issues overnite Difficulty removing wound vac, requiring NS soaks  Review of Systems - Negative except abd pain along incision  Objective: Vital Signs: Blood pressure 134/81, pulse 83, temperature 98.5 F (36.9 C), temperature source Oral, resp. rate 18, height _0  (1.778 m), weight 120 kg (264 lb 8.8 oz), SpO2 97.00%. No results found. Results for orders placed during the hospital encounter of 03/01/13 (from the past 72 hour(s))  BASIC METABOLIC PANEL     Status: Abnormal   Collection Time    03/04/13  3:31 AM      Result Value Range   Sodium 139  137 - 147 mEq/L   Potassium 3.9  3.7 - 5.3 mEq/L   Chloride 103  96 - 112 mEq/L   CO2 24  19 - 32 mEq/L   Glucose, Bld 100 (*) 70 - 99 mg/dL   BUN 10  6 - 23 mg/dL   Creatinine, Ser 1.24  0.50 - 1.35 mg/dL   Calcium 9.1  8.4 - 10.5 mg/dL   GFR calc non Af Amer 62 (*) >90 mL/min   GFR calc Af Amer 71 (*) >90 mL/min   Comment: (NOTE)     The eGFR has been calculated using the CKD EPI equation.     This calculation has not been validated in all clinical situations.     eGFR's  persistently <90 mL/min signify possible Chronic Kidney     Disease.  CBC     Status: Abnormal   Collection Time    03/04/13  3:31 AM      Result Value Range   WBC 4.5  4.0 - 10.5 K/uL   RBC 2.92 (*) 4.22 - 5.81 MIL/uL   Hemoglobin 8.4 (*) 13.0 - 17.0 g/dL   HCT 26.6 (*) 39.0 - 52.0 %   MCV 91.1  78.0 - 100.0 fL   MCH 28.8  26.0 - 34.0 pg   MCHC 31.6  30.0 - 36.0 g/dL   RDW 18.2 (*) 11.5 - 15.5 %   Platelets 207  150 - 400 K/uL  URINALYSIS, ROUTINE W REFLEX MICROSCOPIC     Status: Abnormal   Collection Time    03/04/13 10:20 AM      Result Value Range   Color, Urine YELLOW  YELLOW   APPearance HAZY (*) CLEAR   Specific Gravity, Urine 1.006  1.005 - 1.030   pH 6.0  5.0 - 8.0   Glucose, UA NEGATIVE  NEGATIVE mg/dL   Hgb urine dipstick NEGATIVE  NEGATIVE   Bilirubin Urine NEGATIVE  NEGATIVE   Ketones, ur NEGATIVE  NEGATIVE mg/dL   Protein, ur NEGATIVE  NEGATIVE mg/dL   Urobilinogen, UA 0.2  0.0 - 1.0 mg/dL   Nitrite NEGATIVE  NEGATIVE   Leukocytes, UA TRACE (*) NEGATIVE  URINE CULTURE     Status: None   Collection Time    03/04/13 10:20 AM      Result Value Range   Specimen Description URINE, CLEAN CATCH     Special Requests NONE     Culture  Setup Time       Value: 03/04/2013 16:17     Performed at [facility]       Value: NO GROWTH     Performed at [facility]   Culture       Value: NO GROWTH     Performed at [facility]   Report Status 03/05/2013 FINAL    URINE MICROSCOPIC-ADD ON     Status: None   Collection Time    03/04/13 10:20 AM      Result Value Range   Squamous Epithelial / LPF RARE  RARE   WBC, UA 3-6  <3 WBC/hpf  CBC     Status: Abnormal   Collection Time    03/06/13  5:57 AM      Result Value Range   WBC 5.1  4.0 - 10.5 K/uL   RBC 3.01 (*) 4.22 - 5.81 MIL/uL   Hemoglobin 8.4 (*) 13.0 - 17.0 g/dL   HCT 27.6 (*) 39.0 - 52.0 %   MCV 91.7  78.0 - 100.0 fL   MCH 27.9  26.0 - 34.0 pg   MCHC 30.4  30.0 - 36.0  g/dL   RDW 18.0 (*) 11.5 - 15.5 %   Platelets 228  150 - 400 K/uL     HEENT: normal Cardio: irregular Resp: CTA B/L and unlabored GI: BS positive and non distended Extremity:  Pulses positive and No Edema Skin:   Wound midline wound wac mild tenderness along edges, RLQ colostomy draining well, ichthyosis BLE feet and Other dry skin B LE Neuro: Alert/Oriented, Normal Sensory, Abnormal Motor 4/5 BUE and BLE and Normal FMC Musc/Skel:  Normal GEN NAD   Assessment/Plan: 1. Functional deficits secondary to Deconditioning due to adenocarcinoma of colon  which require 3+ hours per day of interdisciplinary therapy in a comprehensive inpatient rehab setting. Physiatrist is providing close team supervision and 24 hour management of active medical problems listed below. Physiatrist and rehab team continue to assess barriers to discharge/monitor patient progress toward functional and medical goals. Team conference today please see physician documentation under team conference tab, met with team face-to-face to discuss problems,progress, and goals. Formulized individual treatment plan based on medical history, underlying problem and comorbidities. FIM: FIM - Bathing Bathing Steps Patient Completed: Chest;Right Arm;Left Arm;Abdomen;Front perineal area;Buttocks;Right upper leg;Left upper leg Bathing: 4: Min-Patient completes 8-9 65f10 parts or 75+ percent  FIM - Upper Body Dressing/Undressing Upper body dressing/undressing steps patient completed: Thread/unthread left sleeve of pullover shirt/dress;Thread/unthread right sleeve of pullover shirt/dresss;Put head through opening of pull over shirt/dress;Pull shirt over trunk Upper body dressing/undressing: 5: Supervision: Safety issues/verbal cues FIM - Lower Body Dressing/Undressing Lower body dressing/undressing steps patient completed: Thread/unthread right underwear leg;Thread/unthread left underwear leg;Pull underwear up/down;Thread/unthread right  pants leg;Thread/unthread left pants leg;Pull pants up/down;Don/Doff right sock;Don/Doff left sock Lower body dressing/undressing: 5: Supervision: Safety issues/verbal cues  FIM - Toileting Toileting steps completed by patient: Adjust clothing prior to toileting;Adjust clothing after toileting Toileting: 5: Supervision: Safety issues/verbal cues  FIM - TRadio producerDevices: Grab bars Toilet Transfers: 5-To toilet/BSC:  Supervision (verbal cues/safety issues);5-From toilet/BSC: Supervision (verbal cues/safety issues) (stood to urinate)  FIM - Control and instrumentation engineer Devices: HOB elevated Bed/Chair Transfer: 5: Supine > Sit: Supervision (verbal cues/safety issues);5: Chair or W/C > Bed: Supervision (verbal cues/safety issues);5: Bed > Chair or W/C: Supervision (verbal cues/safety issues)  FIM - Locomotion: Wheelchair Locomotion: Wheelchair: 0: Activity did not occur FIM - Locomotion: Ambulation Locomotion: Ambulation Assistive Devices: Administrator Ambulation/Gait Assistance: 5: Supervision Locomotion: Ambulation: 5: Travels 150 ft or more with supervision/safety issues  Comprehension Comprehension Mode: Auditory Comprehension: 6-Follows complex conversation/direction: With extra time/assistive device  Expression Expression Mode: Verbal Expression: 6-Expresses complex ideas: With extra time/assistive device  Social Interaction Social Interaction: 6-Interacts appropriately with others with medication or extra time (anti-anxiety, antidepressant).  Problem Solving Problem Solving: 6-Solves complex problems: With extra time  Memory Memory: 6-More than reasonable amt of time  Medical Problem List and Plan:  1. DVT Prophylaxis/Anticoagulation: Mechanical: Antiembolism stockings, knee (TED hose) Bilateral lower extremities  Sequential compression devices, below knee Bilateral lower extremities  2. Pain Management: Prn  medications effective for abdominal pain.  3. Mood: Provide ego support. LCSW to follow for evaluation and support.  4. Neuropsych: This patient is capable of making decisions on his own behalf.  5. Torsades/ A Fib: Continue coreg bid with Kdur and Mag ox as supplement for electrolyte abnormality. Recommendations to keep Mg > 2.0. Recheck lytes Monday.  6. Acute on chronic CHF: Continue aldactone and coreg. Monitor daily weights.  7. Colon cancer s/p colon RSXN:  - Add protein supplement to help promote wound healing.  -Reports poor appetite--dietary consult to help with food choices.  -ostomy care/mgt education  8. Wound care: Wet to dry dressing As per CCS 9.  Anemia related to adenoca of colon, CCS  LOS (Days) 5 A FACE TO FACE EVALUATION WAS PERFORMED  KIRSTEINS,ANDREW E 03/06/2013, 7:23 AM

## 2013-03-06 NOTE — Progress Notes (Signed)
Occupational Therapy Discharge Summary  Patient Details  Name: DANNEL RAFTER MRN: [id number] Date of Birth: October 17, 1950  Today's Date: 03/06/2013  Patient has met 8 of 8 long term goals due to improved activity tolerance and improved balance.  Patient to discharge at overall Modified Independent level.  Patient is able to direct his own care.  His wife is available to assist him as needed 24/7.  Reasons goals not met: n/a  Recommendation:  No further OT services required.  Equipment: No equipment provided. Pt has a shower chair. Recommended pt purchase a reacher and sock aid.  Reasons for discharge: treatment goals met  Patient/family agrees with progress made and goals achieved: Yes  OT Discharge ADL  mod I overall Vision/Perception  Vision - History Patient Visual Report: No change from baseline Vision - Assessment Eye Alignment: Within Functional Limits Perception Perception: Within Functional Limits Praxis Praxis: Intact  Cognition Overall Cognitive Status: Within Functional Limits for tasks assessed Orientation Level: Oriented X4 Sensation Sensation Light Touch: Appears Intact Stereognosis: Appears Intact Hot/Cold: Appears Intact Proprioception: Appears Intact Coordination Gross Motor Movements are Fluid and Coordinated: Yes Fine Motor Movements are Fluid and Coordinated: Yes Motor  Motor Motor: Within Functional Limits Mobility    mod I with RW Trunk/Postural Assessment  Cervical Assessment Cervical Assessment: Within Functional Limits Thoracic Assessment Thoracic Assessment: Within Functional Limits Lumbar Assessment Lumbar Assessment: Within Functional Limits Postural Control Postural Control: Within Functional Limits  Balance Static Sitting Balance Static Sitting - Level of Assistance: 7: Independent Dynamic Sitting Balance Dynamic Sitting - Level of Assistance: 7: Independent Static Standing Balance Static Standing - Level of Assistance:  7: Independent Dynamic Standing Balance Dynamic Standing - Level of Assistance: 6: Modified independent (Device/Increase time) Extremity/Trunk Assessment RUE Assessment RUE Assessment: Within Functional Limits LUE Assessment LUE Assessment: Within Functional Limits  See FIM for current functional status  SAGUIER,JULIA 03/06/2013, 11:42 AM

## 2013-03-06 NOTE — Progress Notes (Signed)
Social Work Patient ID: Larry [name], male   DOB: 05-Feb-1951, 63 y.o.   MRN: [id number] Met with pt to inform team conference goals-mod/i-supervision level and discharge 1/15.  Wife to be in this afternoon to learn his care and colostomy care. Main issue is to make sure wife is comfortable with the care, since she will be doing it.  [facility] already set up from acute for follow up therapy and RN care. Will touch base with wife when here.

## 2013-03-06 NOTE — Discharge Summary (Signed)
Physician Discharge Summary  Patient ID: Larry [name] MRN: [id number] DOB/AGE: 61-02-1950 61 y.o.  Admit date: 03/01/2013 Discharge date: 03/07/2013  Discharge Diagnoses:  Principal Problem:   Physical deconditioning Active Problems:   OBESITY   Chronic blood loss anemia   Colon cancer   Acute on chronic diastolic heart failure   Discharged Condition: Stable    Labs:  Basic Metabolic Panel:  Recent Labs Lab 03/02/13 0500 03/04/13 0331 03/07/13 0453 03/07/13 1027  NA  --  139 141  --   K  --  3.9 4.3  --   CL  --  103 105  --   CO2  --  24 24  --   GLUCOSE  --  100* 98  --   BUN  --  10 12  --   CREATININE  --  1.24 1.25  --   CALCIUM  --  9.1 9.7  --   MG 1.9  --   --  2.3    CBC:  Recent Labs Lab 03/04/13 0331 03/06/13 0557  WBC 4.5 5.1  HGB 8.4* 8.4*  HCT 26.6* 27.6*  MCV 91.1 91.7  PLT 207 228    CBG: No results found for this basename: GLUCAP,  in the last 168 hours  Brief HPI:   Larry [name] is a 63 y.o. male with history of HTN, NSVT, COPD, admitted 02/02/13 with 5 week history of progressive weakness, constipation x 2 weeks and difficulty eating or drinking. He was found to have acute renal failure with Cr -7.83, anemia 7.3 as well as obstructing colon mass. Colonoscopy with biopsy revealed adenocarcinoma and patient underwent Exp lap with partial colectomy with colostomy on 12/23 by Dr. Hulen Skains. Abdominal wound closure with VAC placement on 02/18/13. On 02/24/13, patient with V fib cardiac arrest.  CPR initiated and patient shocked with subsequent A fib with RVR. Electrolyte abnormality treated and 2D echo with EF 40-45% with periapical akinesis and biatrial enlargement. Patient with recurrent torsades on 01/05 and shocked in NSR.  Therapies initiated and patient noted to be severely deconditioned.  CIR recommended by rehab team.     Hospital Course: Larry [name] was admitted to rehab 03/01/2013 for inpatient therapies to consist of  PT, ST and OT at least three hours five days a week. Past admission physiatrist, therapy team and rehab RN have worked together to provide customized collaborative inpatient rehab. Heart rate has been controlled without recurrent arrythmia.  He was maintained on potassium and magnesium supplements.  Weight at discharge is at 120 kg with compensation of fluid overload. Wound was treated with VAC through 03/05/13 and then changed to wet to dry dressing. Wound bed is healing well with 90% beefy granulation tissue and 10% yellow slough in the most central portion of the wound bed. Wife was educated on dressing changes as well as colostomy care.  He was started on simethicone to help with bloating symptoms. Pain control has been reasonable and po intake has been slowly improving. His energy levels are slowly improving and he has made good progress during his rehab stay.  He is modified independent at discharge.    Rehab course: During patient's stay in rehab weekly team conferences were held to monitor patient's progress, set goals and discuss barriers to discharge. Patient has had improvement in activity tolerance, balance, postural control, as well as ability to compensate for deficits. He is modified independent for ADL tasks as well as all mobility. [facility] to continue  home health therapies past discharge.   Disposition: 01-Home or Self Care   Diet: Bland. Avoid raw fruits and vegetables.   Special Instructions: 1. Cleanse wound with soap and water. Apply hydrogel and damp to dry dressing daily. 2. [facility] to provide PT, OT, RN.       Future Appointments Provider Department Dept Phone   03/26/2013 11:10 AM [name], MD [facility], [place]   06/18/2013 10:30 AM Burtis Junes, NP [place] Office [phone]       Medication List    STOP taking these medications       clopidogrel 75 MG tablet  Commonly known as:  PLAVIX      diltiazem 240 MG 24 hr capsule  Commonly known as:  CARDIZEM CD     furosemide 40 MG tablet  Commonly known as:  LASIX     lisinopril 20 MG tablet  Commonly known as:  PRINIVIL,ZESTRIL      TAKE these medications       aspirin EC 81 MG tablet  Take 1 tablet (81 mg total) by mouth daily.     carvedilol 6.25 MG tablet  Commonly known as:  COREG  Take 1 tablet (6.25 mg total) by mouth 2 (two) times daily with a meal.     ferrous sulfate 325 (65 FE) MG tablet  Take 1 tablet (325 mg total) by mouth daily with breakfast.     HYDROcodone-acetaminophen 5-325 MG per tablet  Commonly known as:  NORCO/VICODIN  Take 1-2 tablets by mouth every 6 (six) hours as needed for moderate pain or severe pain.     levalbuterol 45 MCG/ACT inhaler  Commonly known as:  XOPENEX HFA  Inhale 1-2 puffs into the lungs every 4 (four) hours as needed for wheezing.     magnesium oxide 400 (241.3 MG) MG tablet  Commonly known as:  MAG-OX  Take 1 tablet (400 mg total) by mouth daily.     multivitamin with minerals Tabs tablet  Take 1 tablet by mouth daily.     nitroGLYCERIN 0.4 MG SL tablet  Commonly known as:  NITROSTAT  Place 0.4 mg under the tongue every 5 (five) minutes as needed for chest pain.     pantoprazole 40 MG tablet  Commonly known as:  PROTONIX  Take 1 tablet (40 mg total) by mouth at bedtime.     potassium chloride SA 20 MEQ tablet  Commonly known as:  K-DUR,KLOR-CON  Take 1 tablet (20 mEq total) by mouth 2 (two) times daily.     protein supplement Powd  Take 6 g by mouth 3 (three) times daily with meals.     rosuvastatin 40 MG tablet  Commonly known as:  CRESTOR  Take 40 mg by mouth daily.     simethicone 80 MG chewable tablet  Commonly known as:  GAS-X  Chew 1 tablet (80 mg total) by mouth 4 (four) times daily.     spironolactone 25 MG tablet  Commonly known as:  ALDACTONE  Take 1 tablet (25 mg total) by mouth daily.       Follow-up Information   Call Charlett Blake, MD. (As needed)    Specialty:  Physical Medicine and Rehabilitation   Contact information:   [place] [address] [phone]       Follow up with [name], MD. Call on 03/26/2013. (BE THERE AT 10:45 am for follow up appointment)  Specialty:  General Surgery   Contact information:   [place], [place] [address] [phone]       Call [name], MD. (As needed--for stomach problems)    Specialty:  Gastroenterology   Contact information:   [address] [place] [address] [phone]       Follow up with Ron Parker, MD On 03/19/2013. (APPT @ 2;30 PM)    Specialty:  Family Medicine   Contact information:   [address] [place] [address] [phone]       Signed: Bary Leriche 03/08/2013, 6:26 PM

## 2013-03-06 NOTE — Progress Notes (Signed)
Occupational Therapy Session Note  Patient Details  Name: Larry [name] MRN: [id number] Date of Birth: 23-Jun-1950  Today's Date: 03/06/2013 Time: 8264-1583 Time Calculation (min): 28 min  Short Term Goals: Week 1:  OT Short Term Goal 1 (Week 1): STG=LTG  Skilled Therapeutic Interventions/Progress Updates:  Upon arrival, patient resting in recliner with wife visiting.  Engaged in simple cooking task in therapy kitchen with focus on activity tolerance, reaching Mod I with simple food prep, walker safety, counter walking when it applies, sit><stand from low surfaces, patient and caregiver education.  Patient is Mod I with simple kitchen food prep with and without RW.  Patient and wife state they are ready for discharge all except for ostomy education.  RN notified.  Therapy Documentation Precautions:  Precautions Precautions: Fall Precaution Comments: colostomy, would VAC, abdominal binder Restrictions Weight Bearing Restrictions: No Pain: Denies pain  Therapy/Group: Individual Therapy  Adamariz Gillott, [place] 03/06/2013, 3:20 PM

## 2013-03-06 NOTE — Progress Notes (Signed)
Occupational Therapy Session Note  Patient Details  Name: Larry [name] MRN: [id number] Date of Birth: 28-Aug-1950  Today's Date: 03/06/2013 Time: 1000-1100 Time Calculation (min): 60 min  Short Term Goals: No short term goals set  Skilled Therapeutic Interventions/Progress Updates:    Pt seen for BADL retraining of bathing at shower level and dressing with a focus on use of adaptive aids. Pt ambulated around room to gather all of his clothing and to bathroom with RW independently and showered. He stated at home he soaks is feet in a large dish basin instead of reaching down to wash.  Pt was provided with a basin and he soaked his feet while in the shower. He ambulated out to sit on EOB and dress, using sock aid to don socks. Pt did not have any shoes available. Pt then worked on his HEP using green theraband focusing on shoulder and upper back exercises. Pt states he is ready to go home and does not have any further questions/ concerns for OT. Explained to pt that is last afternoon session would focus on kitchen mobility/ management. Pt resting in recliner at end of session. Pt is now mod I in the room.  Therapy Documentation Precautions:  Precautions Precautions: Fall Precaution Comments: colostomy, would VAC, abdominal binder Restrictions Weight Bearing Restrictions: No   Pain: Pain Assessment Pain Assessment: No/denies pain  ADL:  See FIM for current functional status  Therapy/Group: Individual Therapy  SAGUIER,JULIA 03/06/2013, 11:36 AM

## 2013-03-07 LAB — BASIC METABOLIC PANEL
BUN: 12 mg/dL (ref 6–23)
CHLORIDE: 105 meq/L (ref 96–112)
CO2: 24 mEq/L (ref 19–32)
Calcium: 9.7 mg/dL (ref 8.4–10.5)
Creatinine, Ser: 1.25 mg/dL (ref 0.50–1.35)
GFR calc non Af Amer: 61 mL/min — ABNORMAL LOW (ref >90)
GFR, EST AFRICAN AMERICAN: 71 mL/min — AB (ref >90)
Glucose, Bld: 98 mg/dL (ref 70–99)
Potassium: 4.3 mEq/L (ref 3.7–5.3)
SODIUM: 141 meq/L (ref 137–147)

## 2013-03-07 LAB — MAGNESIUM: MAGNESIUM: 2.3 mg/dL (ref 1.5–2.5)

## 2013-03-07 NOTE — Progress Notes (Signed)
Patient dioscharged to home accompanied by his wife.

## 2013-03-07 NOTE — Consult Note (Signed)
WOC wound follow up Wound type: midline abdominal surgical wound.   Measurement: 12cm x 2.0cm x 0.5cm  Wound bed:90% beefy granulation tissue, 10% yellow slough in the most central portion of the wound bed.  Clean Drainage (amount, consistency, odor) minimal, serosanguinous drainage on old dressing Periwound: intact Dressing procedure/placement/frequency: Hydrogel or NS dressing.  I placed NS dressing today as I did not see the hydrogel in the room.  OK for [facility] to use hydrogel daily or NS BID.  Demonstrated for wife today, she applied tape to secure dressing in place.  WOC ostomy follow up Stoma type/location:  RUQ, end colostomy Stomal assessment/size: 1 1/2" round, more round today, flush with skin  Peristomal assessment: intact Treatment options for stomal/peristomal skin: 2" barrier ring used to aid in seal of flush stoma Output green, pasty output Ostomy pouching: 2pc. 2 1/4" with barrier ring Education provided:  Wife removed old pouch, cleaned skin. I demonstrated measurement of the stoma again today as the stoma did appear less oval today. New template placed in bag with extra supplies.  Wife cut new wafer, placed 2" barrier ring around stoma.  She then placed new barrier and attached pouch with no assistance. Asked patient to close lock and roll closure, he could not do this today and I have reviewed multiple times. He is totally dependent on wife, even though I have encouraged him to participate more.  Date placed on tape border of wafer.   Extra supplies in room 3  2 3/4" sets, 2 2 1/4"sets and 2 barrier rings.   Enrolled patient in [facility] Dis[facility] program: Yes today samples sent beige 2 pc, [employer] and barrier rings.   WOC provided contact information to wife.  Janard Culp [place] RN,CWOCN [phone]

## 2013-03-07 NOTE — Progress Notes (Signed)
Social Work Discharge Note Discharge Note  The overall goal for the admission was met for:   Discharge location: Yes-HOME WITH WIFE WHO CAN PROVIDE 24 HR SUPERVISION LEVEL  Length of Stay: Yes-7 DAYS  Discharge activity level: Yes-MOD/I-SUPERVISION LEVEL  Home/community participation: Yes  Services provided included: MD, RD, PT, OT, RN, Pharmacy and SW  Financial Services: Medicare and Private Insurance: [employer]  Follow-up services arranged: Home Health: [facility]-PT,OT,RN and Patient/Family request agency HH: [facility], DME: NO NEEDS  Comments (or additional information):FAMILY [place] TO [place] PCP SET UP FOR PT WHILE HERE  Patient/Family verbalized understanding of follow-up arrangements: Yes  Individual responsible for coordination of the follow-up plan: GAIL-WIFE & PT  Confirmed correct DME delivered: Elease Hashimoto 03/07/2013    Elease Hashimoto

## 2013-03-07 NOTE — Plan of Care (Signed)
Problem: RH BOWEL ELIMINATION Goal: RH STG MANAGE BOWEL W/EQUIPMENT W/ASSISTANCE STG Manage Bowel With Equipment With min Assistance  Outcome: Not Progressing Staff manages ostomy.

## 2013-03-07 NOTE — Discharge Instructions (Signed)
Inpatient Rehab Discharge Instructions  Larry [name] Discharge date and time:  03/07/13  Activities/Precautions/ Functional Status: Activity: activity as tolerated and no driving till cleared by MD.  Diet: Bland diet. Avoid raw fruits and vegetables.  Wound Care: Cleanse with soap and water. Apply hydrogel and damp to dry dressing daily.   Functional status:  ___ No restrictions     ___ Walk up steps independently ___ 24/7 supervision/assistance   ___ Walk up steps with assistance _x__ Intermittent supervision/assistance  ___ Bathe/dress independently ___ Walk with walker     ___ Bathe/dress with assistance ___ Walk Independently    ___ Shower independently ___ Walk with assistance    ___ Shower with assistance __X_ No alcohol     ___ Return to work/school ________  Special Instructions: Routine colostomy care  COMMUNITY REFERRALS UPON DISCHARGE:    Home Health:   PT, OT, [place] QPRFF:[phone] Date of last service:03/07/2013  Medical Equipment/Items Ordered:NO NEEDS   My questions have been answered and I understand these instructions. I will adhere to these goals and the provided educational materials after my discharge from the hospital.  Patient/Caregiver Signature _______________________________ Date __________  Clinician Signature _______________________________________ Date __________  Please bring this form and your medication list with you to all your follow-up doctor's appointments.

## 2013-03-07 NOTE — Progress Notes (Signed)
Subjective/Complaints: 63 y.o. male with history of HTN, NSVT, COPD, admitted 02/02/13 with 5 week history of progressive weakness, constipation x 2 weeks and difficulty eating or drinking. He was found to have acute renal failure with Cr -7.83, anemia 7.3 as well as obstructing colon mass. Colonoscopy with biopsy revealed adenocarcinoma and patient underwent Exp lap with partial colectomy with colostomy on 12/23 by Dr. Hulen Skains.18/18 nodes negative, liver MRI neg for mets Abdominal wound closure with VAC placement on 02/18/13. On 02/24/13, patient with V fib cardiac arrest and CPR initiated and patient shocked with subsequent A fib with RVR. Electrolyte abnormality treated and 2D echo with EF 40-45% with periapical akinesis and biatrial enlargement. Patient with recurrent torsades on 01/05 and shocked in NSR. Wound healing well and will not need VAC per [facility].  Slept ok, wife coming in for wound care instruction  Review of Systems - Negative except abd pain along incision  Objective: Vital Signs: Blood pressure 116/67, pulse 71, temperature 98.5 F (36.9 C), temperature source Oral, resp. rate 19, height 5' 10" (1.778 m), weight 120 kg (264 lb 8.8 oz), SpO2 98.00%. No results found. Results for orders placed during the hospital encounter of 03/01/13 (from the past 72 hour(s))  URINALYSIS, ROUTINE W REFLEX MICROSCOPIC     Status: Abnormal   Collection Time    03/04/13 10:20 AM      Result Value Range   Color, Urine YELLOW  YELLOW   APPearance HAZY (*) CLEAR   Specific Gravity, Urine 1.006  1.005 - 1.030   pH 6.0  5.0 - 8.0   Glucose, UA NEGATIVE  NEGATIVE mg/dL   Hgb urine dipstick NEGATIVE  NEGATIVE   Bilirubin Urine NEGATIVE  NEGATIVE   Ketones, ur NEGATIVE  NEGATIVE mg/dL   Protein, ur NEGATIVE  NEGATIVE mg/dL   Urobilinogen, UA 0.2  0.0 - 1.0 mg/dL   Nitrite NEGATIVE  NEGATIVE   Leukocytes, UA TRACE (*) NEGATIVE  URINE CULTURE     Status: None   Collection Time    03/04/13 10:20 AM       Result Value Range   Specimen Description URINE, CLEAN CATCH     Special Requests NONE     Culture  Setup Time       Value: 03/04/2013 16:17     Performed at [facility] Count       Value: NO GROWTH     Performed at [facility]   Culture       Value: NO GROWTH     Performed at [facility]   Report Status 03/05/2013 FINAL    URINE MICROSCOPIC-ADD ON     Status: None   Collection Time    03/04/13 10:20 AM      Result Value Range   Squamous Epithelial / LPF RARE  RARE   WBC, UA 3-6  <3 WBC/hpf  CBC     Status: Abnormal   Collection Time    03/06/13  5:57 AM      Result Value Range   WBC 5.1  4.0 - 10.5 K/uL   RBC 3.01 (*) 4.22 - 5.81 MIL/uL   Hemoglobin 8.4 (*) 13.0 - 17.0 g/dL   HCT 27.6 (*) 39.0 - 52.0 %   MCV 91.7  78.0 - 100.0 fL   MCH 27.9  26.0 - 34.0 pg   MCHC 30.4  30.0 - 36.0 g/dL   RDW 18.0 (*) 11.5 - 15.5 %   Platelets 228  150 - 400 K/uL  BASIC METABOLIC PANEL     Status: Abnormal   Collection Time    03/07/13  4:53 AM      Result Value Range   Sodium 141  137 - 147 mEq/L   Potassium 4.3  3.7 - 5.3 mEq/L   Chloride 105  96 - 112 mEq/L   CO2 24  19 - 32 mEq/L   Glucose, Bld 98  70 - 99 mg/dL   BUN 12  6 - 23 mg/dL   Creatinine, Ser 1.25  0.50 - 1.35 mg/dL   Calcium 9.7  8.4 - 10.5 mg/dL   GFR calc non Af Amer 61 (*) >90 mL/min   GFR calc Af Amer 71 (*) >90 mL/min   Comment: (NOTE)     The eGFR has been calculated using the CKD EPI equation.     This calculation has not been validated in all clinical situations.     eGFR's persistently <90 mL/min signify possible Chronic Kidney     Disease.     HEENT: normal Cardio: irregular Resp: CTA B/L and unlabored GI: BS positive and non distended Extremity:  Pulses positive and No Edema Skin:   Wound midline wound wac mild tenderness along edges, RLQ colostomy draining well, ichthyosis BLE feet and Other dry skin B LE Neuro: Alert/Oriented, Normal Sensory, Abnormal Motor  4/5 BUE and BLE and Normal FMC Musc/Skel:  Normal GEN NAD   Assessment/Plan: 1. Functional deficits secondary to Deconditioning due to adenocarcinoma of colon  Stable for D/C today F/u PCP in 1-2 weeks F/u Podiatrist (?[facility]) 3 weeks F/U [facility] 2 wks See D/C summary See D/C instructions  FIM: FIM - Bathing Bathing Steps Patient Completed: Chest;Right Arm;Left Arm;Abdomen;Front perineal area;Buttocks;Right upper leg;Left upper leg;Left lower leg (including foot);Right lower leg (including foot) (soaked feet in basin in shower) Bathing: 6: More than reasonable amount of time  FIM - Upper Body Dressing/Undressing Upper body dressing/undressing steps patient completed: Thread/unthread left sleeve of pullover shirt/dress;Thread/unthread right sleeve of pullover shirt/dresss;Put head through opening of pull over shirt/dress;Pull shirt over trunk Upper body dressing/undressing: 7: Complete Independence: No helper FIM - Lower Body Dressing/Undressing Lower body dressing/undressing steps patient completed: Thread/unthread right underwear leg;Thread/unthread left underwear leg;Pull underwear up/down;Thread/unthread right pants leg;Thread/unthread left pants leg;Pull pants up/down;Don/Doff right sock;Don/Doff left sock Lower body dressing/undressing: 6: Assistive device (Comment) (uses sock aid)  FIM - Toileting Toileting steps completed by patient: Adjust clothing prior to toileting;Adjust clothing after toileting;Performs perineal hygiene Toileting: 6: More than reasonable amount of time  FIM - Radio producer Devices: Insurance account manager Transfers: 6-More than reasonable amt of time  FIM - Control and instrumentation engineer Devices: HOB elevated Bed/Chair Transfer: 5: Supine > Sit: Supervision (verbal cues/safety issues);5: Chair or W/C > Bed: Supervision (verbal cues/safety issues);5: Bed > Chair or W/C: Supervision (verbal cues/safety  issues)  FIM - Locomotion: Wheelchair Locomotion: Wheelchair: 0: Activity did not occur FIM - Locomotion: Ambulation Locomotion: Ambulation Assistive Devices: Administrator Ambulation/Gait Assistance: 6: Modified independent (Device/Increase time) (supervision 150') Locomotion: Ambulation: 5: Travels 150 ft or more with supervision/safety issues  Comprehension Comprehension Mode: Auditory Comprehension: 6-Follows complex conversation/direction: With extra time/assistive device  Expression Expression Mode: Verbal Expression: 6-Expresses complex ideas: With extra time/assistive device  Social Interaction Social Interaction: 6-Interacts appropriately with others with medication or extra time (anti-anxiety, antidepressant).  Problem Solving Problem Solving: 6-Solves complex problems: With extra time  Memory Memory: 6-More than reasonable amt of time  Medical Problem List and Plan:  1. DVT Prophylaxis/Anticoagulation: Mechanical: Antiembolism stockings, knee (TED hose) Bilateral lower extremities  Sequential compression devices, below knee Bilateral lower extremities  2. Pain Management: Prn medications effective for abdominal pain.  3. Mood: Provide ego support. LCSW to follow for evaluation and support.  4. Neuropsych: This patient is capable of making decisions on his own behalf.  5. Torsades/ A Fib: Continue coreg bid with Kdur and Mag ox as supplement for electrolyte abnormality. Recommendations to keep Mg > 2.0. Recheck Mg prior to D/C.  6. Acute on chronic CHF: Continue aldactone and coreg. Monitor daily weights.  7. Colon cancer s/p colon RSXN:   -ostomy care/mgt education  8. Wound care: Wet to dry dressing As per [facility], outpt f/u 9.  Anemia related to adenoca of colon, [facility]  LOS (Days) 6 A FACE TO FACE EVALUATION WAS PERFORMED  KIRSTEINS,ANDREW E 03/07/2013, 9:50 AM

## 2013-03-08 DIAGNOSIS — Z433 Encounter for attention to colostomy: Secondary | ICD-10-CM | POA: Diagnosis not present

## 2013-03-08 DIAGNOSIS — I509 Heart failure, unspecified: Secondary | ICD-10-CM | POA: Diagnosis not present

## 2013-03-08 DIAGNOSIS — R5381 Other malaise: Secondary | ICD-10-CM | POA: Diagnosis not present

## 2013-03-08 DIAGNOSIS — I4891 Unspecified atrial fibrillation: Secondary | ICD-10-CM | POA: Diagnosis not present

## 2013-03-08 DIAGNOSIS — I252 Old myocardial infarction: Secondary | ICD-10-CM | POA: Diagnosis not present

## 2013-03-08 DIAGNOSIS — T8131XA Disruption of external operation (surgical) wound, not elsewhere classified, initial encounter: Secondary | ICD-10-CM | POA: Diagnosis not present

## 2013-03-08 DIAGNOSIS — I5042 Chronic combined systolic (congestive) and diastolic (congestive) heart failure: Secondary | ICD-10-CM | POA: Diagnosis not present

## 2013-03-08 DIAGNOSIS — C189 Malignant neoplasm of colon, unspecified: Secondary | ICD-10-CM | POA: Diagnosis not present

## 2013-03-09 DIAGNOSIS — Z433 Encounter for attention to colostomy: Secondary | ICD-10-CM | POA: Diagnosis not present

## 2013-03-09 DIAGNOSIS — T8131XA Disruption of external operation (surgical) wound, not elsewhere classified, initial encounter: Secondary | ICD-10-CM | POA: Diagnosis not present

## 2013-03-09 DIAGNOSIS — I5042 Chronic combined systolic (congestive) and diastolic (congestive) heart failure: Secondary | ICD-10-CM | POA: Diagnosis not present

## 2013-03-09 DIAGNOSIS — I4891 Unspecified atrial fibrillation: Secondary | ICD-10-CM | POA: Diagnosis not present

## 2013-03-09 DIAGNOSIS — C189 Malignant neoplasm of colon, unspecified: Secondary | ICD-10-CM | POA: Diagnosis not present

## 2013-03-09 DIAGNOSIS — I509 Heart failure, unspecified: Secondary | ICD-10-CM | POA: Diagnosis not present

## 2013-03-11 DIAGNOSIS — I4891 Unspecified atrial fibrillation: Secondary | ICD-10-CM | POA: Diagnosis not present

## 2013-03-11 DIAGNOSIS — T8131XA Disruption of external operation (surgical) wound, not elsewhere classified, initial encounter: Secondary | ICD-10-CM | POA: Diagnosis not present

## 2013-03-11 DIAGNOSIS — I509 Heart failure, unspecified: Secondary | ICD-10-CM | POA: Diagnosis not present

## 2013-03-11 DIAGNOSIS — I5042 Chronic combined systolic (congestive) and diastolic (congestive) heart failure: Secondary | ICD-10-CM | POA: Diagnosis not present

## 2013-03-11 DIAGNOSIS — C189 Malignant neoplasm of colon, unspecified: Secondary | ICD-10-CM | POA: Diagnosis not present

## 2013-03-11 DIAGNOSIS — Z433 Encounter for attention to colostomy: Secondary | ICD-10-CM | POA: Diagnosis not present

## 2013-03-12 DIAGNOSIS — I509 Heart failure, unspecified: Secondary | ICD-10-CM | POA: Diagnosis not present

## 2013-03-12 DIAGNOSIS — I4891 Unspecified atrial fibrillation: Secondary | ICD-10-CM | POA: Diagnosis not present

## 2013-03-12 DIAGNOSIS — I5042 Chronic combined systolic (congestive) and diastolic (congestive) heart failure: Secondary | ICD-10-CM | POA: Diagnosis not present

## 2013-03-12 DIAGNOSIS — Z433 Encounter for attention to colostomy: Secondary | ICD-10-CM | POA: Diagnosis not present

## 2013-03-12 DIAGNOSIS — C189 Malignant neoplasm of colon, unspecified: Secondary | ICD-10-CM | POA: Diagnosis not present

## 2013-03-12 DIAGNOSIS — T8131XA Disruption of external operation (surgical) wound, not elsewhere classified, initial encounter: Secondary | ICD-10-CM | POA: Diagnosis not present

## 2013-03-14 DIAGNOSIS — Z433 Encounter for attention to colostomy: Secondary | ICD-10-CM | POA: Diagnosis not present

## 2013-03-14 DIAGNOSIS — I5042 Chronic combined systolic (congestive) and diastolic (congestive) heart failure: Secondary | ICD-10-CM | POA: Diagnosis not present

## 2013-03-14 DIAGNOSIS — I509 Heart failure, unspecified: Secondary | ICD-10-CM | POA: Diagnosis not present

## 2013-03-14 DIAGNOSIS — T8131XA Disruption of external operation (surgical) wound, not elsewhere classified, initial encounter: Secondary | ICD-10-CM | POA: Diagnosis not present

## 2013-03-14 DIAGNOSIS — I4891 Unspecified atrial fibrillation: Secondary | ICD-10-CM | POA: Diagnosis not present

## 2013-03-14 DIAGNOSIS — C189 Malignant neoplasm of colon, unspecified: Secondary | ICD-10-CM | POA: Diagnosis not present

## 2013-03-18 DIAGNOSIS — C189 Malignant neoplasm of colon, unspecified: Secondary | ICD-10-CM | POA: Diagnosis not present

## 2013-03-18 DIAGNOSIS — Z433 Encounter for attention to colostomy: Secondary | ICD-10-CM | POA: Diagnosis not present

## 2013-03-18 DIAGNOSIS — T8131XA Disruption of external operation (surgical) wound, not elsewhere classified, initial encounter: Secondary | ICD-10-CM | POA: Diagnosis not present

## 2013-03-18 DIAGNOSIS — I4891 Unspecified atrial fibrillation: Secondary | ICD-10-CM | POA: Diagnosis not present

## 2013-03-18 DIAGNOSIS — I509 Heart failure, unspecified: Secondary | ICD-10-CM | POA: Diagnosis not present

## 2013-03-18 DIAGNOSIS — I5042 Chronic combined systolic (congestive) and diastolic (congestive) heart failure: Secondary | ICD-10-CM | POA: Diagnosis not present

## 2013-03-19 DIAGNOSIS — C189 Malignant neoplasm of colon, unspecified: Secondary | ICD-10-CM | POA: Diagnosis not present

## 2013-03-19 DIAGNOSIS — I509 Heart failure, unspecified: Secondary | ICD-10-CM | POA: Diagnosis not present

## 2013-03-19 DIAGNOSIS — I2510 Atherosclerotic heart disease of native coronary artery without angina pectoris: Secondary | ICD-10-CM | POA: Diagnosis not present

## 2013-03-20 DIAGNOSIS — C189 Malignant neoplasm of colon, unspecified: Secondary | ICD-10-CM | POA: Diagnosis not present

## 2013-03-20 DIAGNOSIS — I5042 Chronic combined systolic (congestive) and diastolic (congestive) heart failure: Secondary | ICD-10-CM | POA: Diagnosis not present

## 2013-03-20 DIAGNOSIS — I509 Heart failure, unspecified: Secondary | ICD-10-CM | POA: Diagnosis not present

## 2013-03-20 DIAGNOSIS — T8131XA Disruption of external operation (surgical) wound, not elsewhere classified, initial encounter: Secondary | ICD-10-CM | POA: Diagnosis not present

## 2013-03-20 DIAGNOSIS — I4891 Unspecified atrial fibrillation: Secondary | ICD-10-CM | POA: Diagnosis not present

## 2013-03-20 DIAGNOSIS — Z433 Encounter for attention to colostomy: Secondary | ICD-10-CM | POA: Diagnosis not present

## 2013-03-22 DIAGNOSIS — Z433 Encounter for attention to colostomy: Secondary | ICD-10-CM | POA: Diagnosis not present

## 2013-03-22 DIAGNOSIS — C189 Malignant neoplasm of colon, unspecified: Secondary | ICD-10-CM | POA: Diagnosis not present

## 2013-03-22 DIAGNOSIS — I509 Heart failure, unspecified: Secondary | ICD-10-CM | POA: Diagnosis not present

## 2013-03-22 DIAGNOSIS — I5042 Chronic combined systolic (congestive) and diastolic (congestive) heart failure: Secondary | ICD-10-CM | POA: Diagnosis not present

## 2013-03-22 DIAGNOSIS — T8131XA Disruption of external operation (surgical) wound, not elsewhere classified, initial encounter: Secondary | ICD-10-CM | POA: Diagnosis not present

## 2013-03-22 DIAGNOSIS — I4891 Unspecified atrial fibrillation: Secondary | ICD-10-CM | POA: Diagnosis not present

## 2013-03-26 ENCOUNTER — Ambulatory Visit (INDEPENDENT_AMBULATORY_CARE_PROVIDER_SITE_OTHER): Payer: Federal, State, Local not specified - PPO | Admitting: General Surgery

## 2013-03-26 ENCOUNTER — Encounter (INDEPENDENT_AMBULATORY_CARE_PROVIDER_SITE_OTHER): Payer: Self-pay | Admitting: General Surgery

## 2013-03-26 VITALS — BP 118/70 | HR 80 | Temp 98.8°F | Resp 15 | Ht 70.5 in | Wt 280.2 lb

## 2013-03-26 DIAGNOSIS — C189 Malignant neoplasm of colon, unspecified: Secondary | ICD-10-CM | POA: Diagnosis not present

## 2013-03-26 DIAGNOSIS — T8131XA Disruption of external operation (surgical) wound, not elsewhere classified, initial encounter: Secondary | ICD-10-CM | POA: Diagnosis not present

## 2013-03-26 DIAGNOSIS — I509 Heart failure, unspecified: Secondary | ICD-10-CM | POA: Diagnosis not present

## 2013-03-26 DIAGNOSIS — I5042 Chronic combined systolic (congestive) and diastolic (congestive) heart failure: Secondary | ICD-10-CM | POA: Diagnosis not present

## 2013-03-26 DIAGNOSIS — Z433 Encounter for attention to colostomy: Secondary | ICD-10-CM | POA: Diagnosis not present

## 2013-03-26 DIAGNOSIS — I4891 Unspecified atrial fibrillation: Secondary | ICD-10-CM | POA: Diagnosis not present

## 2013-03-26 DIAGNOSIS — Z09 Encounter for follow-up examination after completed treatment for conditions other than malignant neoplasm: Secondary | ICD-10-CM | POA: Insufficient documentation

## 2013-03-26 LAB — CBC WITH DIFFERENTIAL/PLATELET
BASOS PCT: 0 % (ref 0–1)
Basophils Absolute: 0 10*3/uL (ref 0.0–0.1)
Eosinophils Absolute: 1.1 10*3/uL — ABNORMAL HIGH (ref 0.0–0.7)
Eosinophils Relative: 11 % — ABNORMAL HIGH (ref 0–5)
HCT: 34.9 % — ABNORMAL LOW (ref 39.0–52.0)
Hemoglobin: 11.4 g/dL — ABNORMAL LOW (ref 13.0–17.0)
Lymphocytes Relative: 20 % (ref 12–46)
Lymphs Abs: 1.9 10*3/uL (ref 0.7–4.0)
MCH: 28.1 pg (ref 26.0–34.0)
MCHC: 32.7 g/dL (ref 30.0–36.0)
MCV: 86.2 fL (ref 78.0–100.0)
Monocytes Absolute: 0.8 10*3/uL (ref 0.1–1.0)
Monocytes Relative: 8 % (ref 3–12)
NEUTROS ABS: 5.8 10*3/uL (ref 1.7–7.7)
NEUTROS PCT: 61 % (ref 43–77)
Platelets: 189 10*3/uL (ref 150–400)
RBC: 4.05 MIL/uL — AB (ref 4.22–5.81)
RDW: 16.6 % — ABNORMAL HIGH (ref 11.5–15.5)
WBC: 9.5 10*3/uL (ref 4.0–10.5)

## 2013-03-26 LAB — COMPREHENSIVE METABOLIC PANEL
ALBUMIN: 4 g/dL (ref 3.5–5.2)
ALK PHOS: 97 U/L (ref 39–117)
ALT: 9 U/L (ref 0–53)
AST: 11 U/L (ref 0–37)
BUN: 13 mg/dL (ref 6–23)
CALCIUM: 11.1 mg/dL — AB (ref 8.4–10.5)
CHLORIDE: 104 meq/L (ref 96–112)
CO2: 26 mEq/L (ref 19–32)
Creat: 1.31 mg/dL (ref 0.50–1.35)
GLUCOSE: 90 mg/dL (ref 70–99)
POTASSIUM: 4.4 meq/L (ref 3.5–5.3)
SODIUM: 140 meq/L (ref 135–145)
TOTAL PROTEIN: 7.4 g/dL (ref 6.0–8.3)
Total Bilirubin: 1.1 mg/dL (ref 0.2–1.2)

## 2013-03-26 NOTE — Progress Notes (Signed)
Subjective:     Patient ID: Larry [name], male   DOB: 01/23/1951, 63 y.o.   MRN: [id number]  HPI The patient is here for his first postoperative visit status post colon resection with a right upper quadrant colostomy and for an obstructing colon cancer. His course was complicated postoperatively by a fascial dehiscence which was repaired. He has an open midline wound for which his wife is taking care of.  Review of Systems No lightheadedness. No bleeding from his colostomy.     Objective:   Physical Exam Abdominal exam: The patient's colostomy in the right upper quadrant is functioning well and is pink and viable. His midline wound has excellent granulation tissue and measures approximately 2 cm wide and approximately 15 cm long. The depth is less than a centimeter there are no exposed sutures.  The wife has been dressing this with hydrogel and wet-to-dry dressings. I replaced the wet-to-dry dressings with sterile saline and 4 x 4 gauze.      Assessment:     Doing well status post urgent colectomy and colostomy for an obstructing colon cancer.  Excellent healing of his midline wound.     Plan:     The patient needs repeat blood work including a CBC, a comprehensive metabolic panel, and a CEA level. Also a referral to oncology was made today for the colon cancer.The patient had invasive adenocarcinoma of the transverse colon with penetration into the pericolonic fat. He had 0/18 lymph nodes negative for metastatic disease.  The patient is to continue his wet-to-dry dressings once a day. This can be done with hydrogel only. I will see him back in one month. At that time we can begin discussions about reversal if he has been seen by the oncologist and chemotherapy is notplanned.      Kathryne Eriksson. Dahlia Bailiff, MD, [place] [phone] [facility]

## 2013-03-27 DIAGNOSIS — C189 Malignant neoplasm of colon, unspecified: Secondary | ICD-10-CM | POA: Diagnosis not present

## 2013-03-27 DIAGNOSIS — Z433 Encounter for attention to colostomy: Secondary | ICD-10-CM | POA: Diagnosis not present

## 2013-03-27 DIAGNOSIS — I5042 Chronic combined systolic (congestive) and diastolic (congestive) heart failure: Secondary | ICD-10-CM | POA: Diagnosis not present

## 2013-03-27 DIAGNOSIS — I509 Heart failure, unspecified: Secondary | ICD-10-CM | POA: Diagnosis not present

## 2013-03-27 DIAGNOSIS — T8131XA Disruption of external operation (surgical) wound, not elsewhere classified, initial encounter: Secondary | ICD-10-CM | POA: Diagnosis not present

## 2013-03-27 DIAGNOSIS — I4891 Unspecified atrial fibrillation: Secondary | ICD-10-CM | POA: Diagnosis not present

## 2013-03-27 LAB — CEA: CEA: 1.2 ng/mL (ref 0.0–5.0)

## 2013-03-28 ENCOUNTER — Telehealth: Payer: Self-pay | Admitting: *Deleted

## 2013-03-28 DIAGNOSIS — I5042 Chronic combined systolic (congestive) and diastolic (congestive) heart failure: Secondary | ICD-10-CM | POA: Diagnosis not present

## 2013-03-28 DIAGNOSIS — I4891 Unspecified atrial fibrillation: Secondary | ICD-10-CM | POA: Diagnosis not present

## 2013-03-28 DIAGNOSIS — I509 Heart failure, unspecified: Secondary | ICD-10-CM | POA: Diagnosis not present

## 2013-03-28 DIAGNOSIS — C189 Malignant neoplasm of colon, unspecified: Secondary | ICD-10-CM | POA: Diagnosis not present

## 2013-03-28 DIAGNOSIS — Z433 Encounter for attention to colostomy: Secondary | ICD-10-CM | POA: Diagnosis not present

## 2013-03-28 DIAGNOSIS — T8131XA Disruption of external operation (surgical) wound, not elsewhere classified, initial encounter: Secondary | ICD-10-CM | POA: Diagnosis not present

## 2013-03-28 NOTE — Telephone Encounter (Signed)
Received referral in the [facility] and I emailed Tiffany in [facility] to make her aware because of the diagnosis.  I emailed Alisha at [place] to make her aware.

## 2013-03-29 ENCOUNTER — Telehealth: Payer: Self-pay | Admitting: Internal Medicine

## 2013-03-29 NOTE — Telephone Encounter (Signed)
s/w patient wife and gave np appt 02/09 @ 10:30 w/Dr. Juliann Mule.  Referring Dr. Judeth Horn Dx- Colon Ca

## 2013-03-29 NOTE — Telephone Encounter (Signed)
C/D 03/29/13 for appt. 04/01/13 °

## 2013-04-01 ENCOUNTER — Telehealth: Payer: Self-pay | Admitting: Internal Medicine

## 2013-04-01 ENCOUNTER — Encounter: Payer: Self-pay | Admitting: Internal Medicine

## 2013-04-01 ENCOUNTER — Other Ambulatory Visit: Payer: Federal, State, Local not specified - PPO

## 2013-04-01 ENCOUNTER — Ambulatory Visit (HOSPITAL_BASED_OUTPATIENT_CLINIC_OR_DEPARTMENT_OTHER): Payer: Federal, State, Local not specified - PPO | Admitting: Internal Medicine

## 2013-04-01 ENCOUNTER — Ambulatory Visit: Payer: Federal, State, Local not specified - PPO

## 2013-04-01 VITALS — BP 99/73 | HR 112 | Temp 97.4°F | Resp 18 | Ht 70.0 in | Wt 283.2 lb

## 2013-04-01 DIAGNOSIS — I498 Other specified cardiac arrhythmias: Secondary | ICD-10-CM | POA: Diagnosis not present

## 2013-04-01 DIAGNOSIS — N179 Acute kidney failure, unspecified: Secondary | ICD-10-CM

## 2013-04-01 DIAGNOSIS — I4729 Other ventricular tachycardia: Secondary | ICD-10-CM

## 2013-04-01 DIAGNOSIS — I4891 Unspecified atrial fibrillation: Secondary | ICD-10-CM

## 2013-04-01 DIAGNOSIS — J449 Chronic obstructive pulmonary disease, unspecified: Secondary | ICD-10-CM

## 2013-04-01 DIAGNOSIS — C189 Malignant neoplasm of colon, unspecified: Secondary | ICD-10-CM

## 2013-04-01 DIAGNOSIS — I472 Ventricular tachycardia: Secondary | ICD-10-CM

## 2013-04-01 DIAGNOSIS — R5381 Other malaise: Secondary | ICD-10-CM

## 2013-04-01 DIAGNOSIS — I4721 Torsades de pointes: Secondary | ICD-10-CM

## 2013-04-01 DIAGNOSIS — I5033 Acute on chronic diastolic (congestive) heart failure: Secondary | ICD-10-CM | POA: Diagnosis not present

## 2013-04-01 DIAGNOSIS — C184 Malignant neoplasm of transverse colon: Secondary | ICD-10-CM

## 2013-04-01 DIAGNOSIS — I2589 Other forms of chronic ischemic heart disease: Secondary | ICD-10-CM

## 2013-04-01 DIAGNOSIS — I2510 Atherosclerotic heart disease of native coronary artery without angina pectoris: Secondary | ICD-10-CM

## 2013-04-01 NOTE — Patient Instructions (Signed)
Colorectal Cancer Colorectal cancer is an abnormal growth of tissue (tumor) in the colon or rectum that is cancerous (malignant). Unlike noncancerous (benign) tumors, malignant tumors can spread to other parts of your body. The colon is the large bowel or large intestine. The rectum is the last several inches of the colon.  RISK FACTORS The exact cause of colorectal cancer is unknown. However, the following factors may increase your chances of getting colorectal cancer:   Age older than 24 years.   Abnormal growths (polyps) on the inner wall of the colon or rectum.   Diabetes.   African American race.   Family history of hereditary nonpolyposis colorectal cancer. This condition is caused by changes in the genes that are responsible for repairing mismatched DNA.   Personal history of cancer. A person who has already had colorectal cancer may develop it a second time. Also, women with a history of ovarian, uterine, or breast cancer are at a somewhat higher risk of developing colorectal cancer.  Certain hereditary conditions.  Eating a diet that is high in fat (especially animal fat) and low in fiber, fruits, and vegetables.  Sedentary lifestyle.  Inflammatory bowel disease, including ulcerative colitis and Crohn disease.   Smoking.   Excessive alcohol use.  SYMPTOMS Early colorectal cancer often does not cause symptoms. As the cancer grows, symptoms may include:   Changes in bowel habits.  Diarrhea.   Constipation.   Feeling like the bowel does not empty completely after a bowel movement.   Blood in the stool.   Stools that are narrower than usual.   Abdominal discomfort, pain, bloating, fullness, or cramps.  Frequent gas pain.   Unexplained weight loss.   Constant tiredness.   Nausea and vomiting.  DIAGNOSIS  Your health care provider will ask about your medical history. He or she may also perform a number of procedures, such as:   A physical  exam.  A digital rectal exam.  A fecal occult blood test.  A barium enema.  Blood tests.   X-rays.   Imaging tests, such as CT scans or MRIs.   Taking a tissue sample (biopsy) from your colon or rectum to look for cancer cells.   A sigmoidoscopy to view the inside of the last part of your colon.   A colonoscopy to view the inside of your entire colon.   An endorectal ultrasound to see how deep a rectal tumor has grown and whether the cancer has spread to lymph nodes or other nearby tissues.  Your cancer will be staged to determine its severity and extent. Staging is a careful attempt to find out the size of the tumor, whether the cancer has spread, and if so, to what parts of the body. You may need to have more tests to determine the stage of your cancer. The test results will help determine what treatment plan is best for you.   Stage 0 The cancer is found only in the innermost lining of the colon or rectum.   Stage I The cancer has grown into the inner wall of the colon or rectum. The cancer has not yet reached the outer wall of the colon.   Stage II The cancer extends more deeply into or through the wall of the colon or rectum. It may have invaded nearby tissue, but cancer cells have not spread to the lymph nodes.   Stage III The cancer has spread to nearby lymph nodes but not to other parts of the body.  Stage IV The cancer has spread to other parts of the body, such as the liver or lungs.  Your health care provider may tell you the detailed stage of your cancer, which includes both a number and a letter.  TREATMENT  Depending on the type and stage, colorectal cancer may be treated with surgery, radiation therapy, chemotherapy, targeted therapy, or radiofrequency ablation. Some people have a combination of these therapies. Surgery may be done to remove the polyps from your colon. In early stages, your health care provider may be able to do this during a  colonoscopy. In later stages, surgery may be done to remove part of your colon.  HOME CARE INSTRUCTIONS   Only take over-the-counter or prescription medicines for pain, discomfort, or fever as directed by your health care provider.   Maintain a healthy diet.   Consider joining a support group. This may help you learn to cope with the stress of having colorectal cancer.   Seek advice to help you manage treatment of side effects.   Keep all follow-up appointments as directed by your health care provider.   Inform your cancer specialist if you are admitted to the hospital.  SEEK MEDICAL CARE IF:  Your diarrhea or constipation does not go away.   Your bowel habits change.  You have increased abdominal pain.   You notice new fatigue or weakness.  You lose weight. Document Released: 02/07/2005 Document Revised: 10/10/2012 Document Reviewed: 08/02/2012 [facility] Patient Information 2014 Center.

## 2013-04-01 NOTE — Telephone Encounter (Signed)
gave pt appt for lab and Md for April 2015 °

## 2013-04-01 NOTE — Progress Notes (Signed)
Checked in new pt with no financial concerns. °

## 2013-04-01 NOTE — Progress Notes (Signed)
[facility] [phone]   Fax:([phone]) [phone]  NEW PATIENT EVALUATION   Name: Larry [name] Date: 04/01/2013 MRN: [id number] DOB: 28-Apr-1950  PCP: Altamese [place], MD   REFERRING PHYSICIAN: Altamese [place], MD  REASON FOR REFERRAL: Colon Cancer    HISTORY OF PRESENT ILLNESS:Larry [name] is a 63 y.o. male who is presenting for further management and evaluation of his colon cancer.  He has an extensive past medical history including hypertension,  NSVT, COPD secondary longstanding tobacco abuse (Quit 2010) and recently Stage IIC Colon Cancer.     He was recently admitted (02/02/2013 - 03/01/2013) to [facility] with subacute history of progressive weakness, constipation and difficulty eating and drinking.  He presented with acute renal failure (Creatinine 7.83) and severe anemia (Hbg of 7.3) as well as obstruction.  Colonoscopy doing this admission with biopsy revealed adenocarcinoma and patient underwent exploratory lap with partial colectomy with colostomy on 12/23 by Dr. Lindie Spruce.  Recovery was complicated by wound dehissance, requiring a return to the OR 12/29 with abdominal wound closure with VAC placment. After the procedure, the patient developed atrial tachycardia, so a dilt drip was started (12/29-1/2). On 1/4, the patient had a cardiac arrest, requiring defibrillation, and was subsequently transferred to the ICU. QT was found to be > 700, with mild hypokalemia and hypomagnesemia implicated as the cause. Lidocaine drip was started.  Electrolyte abnormality treated and 2D-echo revealed an EF of 40-45% with periapical akinesis and biatrial enlargement.  He experienced recurrent torsades on 01/05 and shocked in NSR.  By 1/6, QTc had decreased to 499, lidocaine drip was d/c'ed, and patient was transferred out of ICU to stepdown.    He was then admitted to physical medicine and rehabilitation on 01/09 due to severe deconditioning.  He was continued on  protein supplement to promote wound healing.  His abdominal wound was treated with VAC through 03/05/13. His heart rate was controlled without recurrent arrythmia and was maintained on potassium and magnesium supplements.  He was discharged to home on 03/07/2013.  He was last evaluated by Dr. Lindie Spruce on 02/03 with review of his pathology demonstrating invasive adenocarcinoma of the transverse colon with penetration into the pericolonic fat.  He had 0/18 lymph nodes negative for metastatic disease.  He was pathologic stage [id number].   He was continued on his wet-to-dry dressings and scheduled for one month follow-up and evaluation by medical oncology.    PAST MEDICAL HISTORY:  has a past medical history of Hypertension; Hypercholesteremia; CHF (congestive heart failure); Coronary artery disease; COPD (chronic obstructive pulmonary disease); Asthma; Obesity; Noncompliance; NSVT (nonsustained ventricular tachycardia); and Atrial tachycardia.     PAST SURGICAL HISTORY: Past Surgical History  Procedure Laterality Date  . Coronary stent placement      x2  . Cardiac catheterization  11/12/2008    stent x 2 to RCA  . Colonoscopy N/A 02/08/2013    Procedure: COLONOSCOPY;  Surgeon: Theda Belfast, MD;  Location: [facility] ENDOSCOPY;  Service: Endoscopy;  Laterality: N/A;  . Esophagogastroduodenoscopy N/A 02/08/2013    Procedure: ESOPHAGOGASTRODUODENOSCOPY (EGD);  Surgeon: Theda Belfast, MD;  Location: [facility] ENDOSCOPY;  Service: Endoscopy;  Laterality: N/A;  . Laparotomy N/A 02/12/2013    Procedure: EXPLORATORY LAPAROTOMY PARTIAL COLECTOMY WITH COLOSTOMY;  Surgeon: Cherylynn Ridges, MD;  Location: [facility] OR;  Service: General;  Laterality: N/A;  . Laparotomy N/A 02/18/2013    Procedure: EXPLORATORY LAPAROTOMY/Closure of Wound;  Surgeon: Axel Filler, MD;  Location: [facility] OR;  Service: General;  Laterality: N/A;     CURRENT MEDICATIONS: has a current medication list which includes the following prescription(s): aspirin ec,  carvedilol, ferrous sulfate, hydrocodone-acetaminophen, levalbuterol, magnesium oxide, multivitamin with minerals, nitroglycerin, pantoprazole, potassium chloride sa, protein supplement, rosuvastatin, simethicone, and spironolactone.   ALLERGIES: Review of patient's allergies indicates no known allergies.   SOCIAL HISTORY:  reports that he quit smoking about 4 years ago. His smoking use included Cigarettes. He smoked 0.00 packs per day. He has never used smokeless tobacco. He reports that he does not drink alcohol or use illicit drugs.   FAMILY HISTORY: family history includes Diabetes in his father; Heart disease in his father; Hypertension in his father and mother.   LABORATORY DATA:  CBC    Component Value Date/Time   WBC 9.5 03/26/2013 1225   RBC 4.05* 03/26/2013 1225   HGB 11.4* 03/26/2013 1225   HCT 34.9* 03/26/2013 1225   PLT 189 03/26/2013 1225   MCV 86.2 03/26/2013 1225   MCH 28.1 03/26/2013 1225   MCHC 32.7 03/26/2013 1225   RDW 16.6* 03/26/2013 1225   LYMPHSABS 1.9 03/26/2013 1225   MONOABS 0.8 03/26/2013 1225   EOSABS 1.1* 03/26/2013 1225   BASOSABS 0.0 03/26/2013 1225    CMP     Component Value Date/Time   NA 140 03/26/2013 1225   K 4.4 03/26/2013 1225   CL 104 03/26/2013 1225   CO2 26 03/26/2013 1225   GLUCOSE 90 03/26/2013 1225   BUN 13 03/26/2013 1225   CREATININE 1.31 03/26/2013 1225   CREATININE 1.25 03/07/2013 0453   CALCIUM 11.1* 03/26/2013 1225   PROT 7.4 03/26/2013 1225   ALBUMIN 4.0 03/26/2013 1225   AST 11 03/26/2013 1225   ALT 9 03/26/2013 1225   ALKPHOS 97 03/26/2013 1225   BILITOT 1.1 03/26/2013 1225   GFRNONAA 61* 03/07/2013 0453   GFRAA 71* 03/07/2013 0453   Results for VITALI, SEIBERT (MRN [id number]) as of 04/02/2013 02:15  Ref. Range 02/08/2013 10:30 03/26/2013 12:25  CEA Latest Range: 0.0-5.0 ng/mL 5.2 (H) 1.2   PATHOLOGY: Colon, segmental resection for tumor, Transverse - INVASIVE COLORECTAL ADENOCARCINOMA EXTENDING INTO PERICOLONIC CONNECTIVE TISSUE AND FOCALLY INTO ADHERENT  OMENTUM. - MARGINS NOT INVOLVED. - EIGHTEEN BENIGN LYMPH NODES (0/18). Microscopic Comment COLON AND RECTUM (INCLUDING TRANS-ANAL RESECTION): Specimen: Transverse colon. Procedure: Segmental resection. Tumor site: Mid transverse colon. Specimen integrity: Intact. Macroscopic intactness of mesorectum: Not applicable. Macroscopic tumor perforation: No. Invasive tumor: Maximum size: 8 cm Histologic type(s): Colorectal adenocarcinoma. Histologic grade and differentiation: G2: moderately differentiated/low grade Type of polyp in which invasive carcinoma arose: N/A Microscopic extension of invasive tumor: Into pericolonic connective tissue and focally into adherent omentum. Lymph-Vascular invasion: Not identified. Peri-neural invasion: No. Tumor deposit(s) (discontinuous extramural extension): No. Resection margins: Proximal margin: Free of tumor. Distal margin: Free of tumor. Circumferential (radial) (posterior ascending, posterior descending; lateral and posterior mid-rectum; and entire lower 1/3 rectum):N/A Mesenteric margin (sigmoid and transverse): Free of tumor. Distance closest margin (if all above margins negative): 4.5 cm from distal margin. Trans-anal resection margins only: N/A Deep margin: N/A 1 of 3 FINAL for WILDER, KUROWSKI ([id number]) Microscopic Comment(continued) Mucosal Margin: N/A Distance closest mucosal margin (if negative): N/A Treatment effect (neo-adjuvant therapy): No. Additional polyp(s): No. Non-neoplastic findings: N/A Lymph nodes: number examined 18; number positive: 0 Pathologic Staging: pT4b, pN0, pMX Ancillary studies: Can be performed if requested. (JDP:ecj 02/15/2013) Claudette Laws MD Pathologist, Electronic Signature (Case signed 02/15/2013) Specimen Gross and Clinical Information Specimen(s) Obtained: Colon,  segmental resection for tumor, Transverse Specimen Clinical Information Colonic Mass; Obstructing colon mass  (jmc) Gross Specimen: Transverse colon, received in formalin. There is a suture attached marking the distal margin. Specimen integrity: Intact Specimen length: 18 cm Tumor location: The tumor is circumferential and located in the mid portion of the specimen. Tumor size: The tumor measures 5.5 cm in length x 8 cm in diameter x 2 cm maximum thickness. The tumor consists of a sessile, ulcerated tan red mass. Percent of bowel circumference involved: 100% Tumor distance to margins: Proximal: 8 cm Distal: 4.5 cm Mesenteric (sigmoid and transverse): 5 cm Macroscopic extent of tumor invasion: The tumor involves the full thickness of the wall and extends through the wall into the mesenteric fat. There is omentum adherent along the antimesenteric border and tumor extends through the wall into the adherent omentum. Total presumed lymph nodes: There are nineteen rubbery tan pink ovoid nodules tentatively identified as lymph nodes varying in size from 0.3 to 1.4 cm in greatest dimension. Extramural satellite tumor nodules: Not grossly identified Mucosal polyp(s): Not identified Additional findings: The uninvolved mucosa is glistening and tan.   RADIOGRAPHY: No results found.     REVIEW OF SYSTEMS:  Constitutional: Denies fevers, chills or abnormal weight loss Eyes: Denies blurriness of vision Ears, nose, mouth, throat, and face: Denies mucositis or sore throat Respiratory: Denies cough, dyspnea or wheezes Cardiovascular: Denies palpitation, chest discomfort or lower extremity swelling Gastrointestinal:  Denies nausea, heartburn or change in bowel habits Skin: Denies abnormal skin rashes Lymphatics: Denies new lymphadenopathy or easy bruising Neurological:Denies numbness, tingling or new weaknesses Behavioral/Psych: Mood is stable, no new changes  All other systems were reviewed with the patient and are negative.  PHYSICAL EXAM:  height is 5\' 10"  (1.778 m) and weight is 283 lb 3.2 oz  (128.459 kg). His oral temperature is 97.4 F (36.3 C). His blood pressure is 99/73 and his pulse is 112. His respiration is 18 and oxygen saturation is 100%.    GENERAL:alert, no distress and comfortable; ECOG 2 Morbidly obese.  SKIN: skin color, texture, turgor are normal, no rashes or significant lesions EYES: normal, Conjunctiva are pink and non-injected, sclera clear OROPHARYNX:no exudate, no erythema and lips, buccal mucosa, and tongue normal  NECK: supple, thyroid normal size, non-tender, without nodularity LYMPH:  no palpable lymphadenopathy in the cervical, axillary or inguinal LUNGS: clear to auscultation and percussion with normal breathing effort HEART: Tachycardia  and no murmurs and 2+ woody edema  ABDOMEN:abdomen soft, non-tender and normal bowel sounds; CDI midline abdominal wound.  Musculoskeletal:no cyanosis of digits and no clubbing  NEURO: alert & oriented x 3 with fluent speech, no focal motor/sensory deficits   IMPRESSION: Larry [name] is a 63 y.o. male with a history of   PLAN:  1.  Colon Cancer.  --He is Stage IIC.  Based on the size of his tumor and absence of nodal involvement (0/18 lymph nodes).  High risk features include T4 disease and obstructive mass.  Favorable risk features include lack of nodal involvement, greater than 10 lymph nodes examined, lack of venous invasion or perforation.   --We recommend against adjuvant chemotherapy therapy due to his current functional status and the severity of his cardiac history given two significant cardiac events doing the recent hospitalizations, i.e., Torsades de points.  We provided a detail overview of the principle os adjuvant chemotherapy and used Capecitabine as an example in the the Stage III setting based on Twelves C, et. Al, . NEJM  2005.   --We measured the marginal survival benefit in the setting of stage II disease, even with the high risk features as noted above, against the risk of further electrolyte  abnormalities resulting from chemotherapy due to nausea or vomiting and diarrhea.  The resulting magnitude of the magnesium deficit or dehydration could prompt further cardiac events which can life-threatening.   Furthermore, no existent, consistent data  predict benefit from adjuvant chemotherapy and the lack of clinical correlation between risk features and selection of chemotherapy in high risk stage II disease makes observation for this patient reasonable.  The patient was in agreement.  He understands the risk of recurrence is higher due to these high risk features.    2. Follow-up. --We will follow-up in 2 months for symptom check and labs including chemistries, CBC and CEA.  We will follow CT chest/abdomen/pelvis annually for 3-5 years given his high risk features.   All questions were answered. The patient knows to call the clinic with any problems, questions or concerns. We can certainly see the patient much sooner if necessary.  I spent 40 minutes counseling the patient face to face. The total time spent in the appointment was 60 minutes.    Vaughn Beaumier, MD 04/01/2013 11:15 AM

## 2013-04-03 DIAGNOSIS — I509 Heart failure, unspecified: Secondary | ICD-10-CM | POA: Diagnosis not present

## 2013-04-03 DIAGNOSIS — I4891 Unspecified atrial fibrillation: Secondary | ICD-10-CM | POA: Diagnosis not present

## 2013-04-03 DIAGNOSIS — C189 Malignant neoplasm of colon, unspecified: Secondary | ICD-10-CM | POA: Diagnosis not present

## 2013-04-03 DIAGNOSIS — Z433 Encounter for attention to colostomy: Secondary | ICD-10-CM | POA: Diagnosis not present

## 2013-04-03 DIAGNOSIS — T8131XA Disruption of external operation (surgical) wound, not elsewhere classified, initial encounter: Secondary | ICD-10-CM | POA: Diagnosis not present

## 2013-04-03 DIAGNOSIS — I5042 Chronic combined systolic (congestive) and diastolic (congestive) heart failure: Secondary | ICD-10-CM | POA: Diagnosis not present

## 2013-04-10 DIAGNOSIS — T8131XA Disruption of external operation (surgical) wound, not elsewhere classified, initial encounter: Secondary | ICD-10-CM | POA: Diagnosis not present

## 2013-04-10 DIAGNOSIS — I5042 Chronic combined systolic (congestive) and diastolic (congestive) heart failure: Secondary | ICD-10-CM | POA: Diagnosis not present

## 2013-04-10 DIAGNOSIS — I4891 Unspecified atrial fibrillation: Secondary | ICD-10-CM | POA: Diagnosis not present

## 2013-04-10 DIAGNOSIS — I509 Heart failure, unspecified: Secondary | ICD-10-CM | POA: Diagnosis not present

## 2013-04-10 DIAGNOSIS — C189 Malignant neoplasm of colon, unspecified: Secondary | ICD-10-CM | POA: Diagnosis not present

## 2013-04-10 DIAGNOSIS — Z433 Encounter for attention to colostomy: Secondary | ICD-10-CM | POA: Diagnosis not present

## 2013-04-12 ENCOUNTER — Telehealth: Payer: Self-pay | Admitting: Nurse Practitioner

## 2013-04-12 MED ORDER — POTASSIUM CHLORIDE CRYS ER 20 MEQ PO TBCR: 20.0000 meq | EXTENDED_RELEASE_TABLET | Freq: Two times a day (BID) | ORAL | Status: DC

## 2013-04-12 NOTE — Telephone Encounter (Signed)
New message     Pt is on klor-con m20 tabs.  Wife has been giving pt 2 tablets daily for two weeks.  The bottle said give 1 daily.  Starting today---wife gave him 1 tablet.  She want Cecille Rubin to know that she had been giving husband 2 tablets daily.

## 2013-04-12 NOTE — Telephone Encounter (Signed)
Wife calls b/c her husband has been taking his potassium 64meq 1 tablet bid. States a bottle she has says 1 tablet daily He has been taking 2 tablets since discharge from hospital in mid-January  Reviewed labs & discharge instructions. Pt last dc instructions stated he should take 1 tablet (51meq) twice a day. Potassium within normal & trended up slightly over the past month. Pt is on Spironolactone.  Refill for potassium sent in & encouraged to avoid potassium rich foods. Will forward this to Felisa Bonier RN

## 2013-04-15 ENCOUNTER — Other Ambulatory Visit: Payer: Self-pay | Admitting: *Deleted

## 2013-04-15 ENCOUNTER — Telehealth: Payer: Self-pay | Admitting: *Deleted

## 2013-04-15 DIAGNOSIS — E875 Hyperkalemia: Secondary | ICD-10-CM

## 2013-04-15 NOTE — Telephone Encounter (Signed)
S/w pt's wife is agreeable to plan will come in tomorrow for repeat bmet

## 2013-04-15 NOTE — Telephone Encounter (Signed)
Would he come for a BMET to recheck?

## 2013-04-16 ENCOUNTER — Other Ambulatory Visit: Payer: Federal, State, Local not specified - PPO

## 2013-04-17 ENCOUNTER — Other Ambulatory Visit (INDEPENDENT_AMBULATORY_CARE_PROVIDER_SITE_OTHER): Payer: Medicare Other

## 2013-04-17 DIAGNOSIS — Z433 Encounter for attention to colostomy: Secondary | ICD-10-CM | POA: Diagnosis not present

## 2013-04-17 DIAGNOSIS — I5042 Chronic combined systolic (congestive) and diastolic (congestive) heart failure: Secondary | ICD-10-CM | POA: Diagnosis not present

## 2013-04-17 DIAGNOSIS — T8131XA Disruption of external operation (surgical) wound, not elsewhere classified, initial encounter: Secondary | ICD-10-CM | POA: Diagnosis not present

## 2013-04-17 DIAGNOSIS — E875 Hyperkalemia: Secondary | ICD-10-CM

## 2013-04-17 DIAGNOSIS — I4891 Unspecified atrial fibrillation: Secondary | ICD-10-CM | POA: Diagnosis not present

## 2013-04-17 DIAGNOSIS — I509 Heart failure, unspecified: Secondary | ICD-10-CM | POA: Diagnosis not present

## 2013-04-17 DIAGNOSIS — C189 Malignant neoplasm of colon, unspecified: Secondary | ICD-10-CM | POA: Diagnosis not present

## 2013-04-17 LAB — BASIC METABOLIC PANEL
BUN: 12 mg/dL (ref 6–23)
CO2: 24 mEq/L (ref 19–32)
Calcium: 10.1 mg/dL (ref 8.4–10.5)
Chloride: 103 mEq/L (ref 96–112)
Creatinine, Ser: 1.6 mg/dL — ABNORMAL HIGH (ref 0.4–1.5)
GFR: 58.21 mL/min — ABNORMAL LOW (ref >60.00)
Glucose, Bld: 79 mg/dL (ref 70–99)
Potassium: 3.6 mEq/L (ref 3.5–5.1)
Sodium: 136 mEq/L (ref 135–145)

## 2013-04-18 NOTE — Telephone Encounter (Signed)
PATIENT NOT BILLED SAME DAY CX FEE/YF

## 2013-04-24 ENCOUNTER — Telehealth (INDEPENDENT_AMBULATORY_CARE_PROVIDER_SITE_OTHER): Payer: Self-pay

## 2013-04-24 DIAGNOSIS — I4891 Unspecified atrial fibrillation: Secondary | ICD-10-CM | POA: Diagnosis not present

## 2013-04-24 DIAGNOSIS — I5042 Chronic combined systolic (congestive) and diastolic (congestive) heart failure: Secondary | ICD-10-CM | POA: Diagnosis not present

## 2013-04-24 DIAGNOSIS — I509 Heart failure, unspecified: Secondary | ICD-10-CM | POA: Diagnosis not present

## 2013-04-24 DIAGNOSIS — C189 Malignant neoplasm of colon, unspecified: Secondary | ICD-10-CM | POA: Diagnosis not present

## 2013-04-24 DIAGNOSIS — Z433 Encounter for attention to colostomy: Secondary | ICD-10-CM | POA: Diagnosis not present

## 2013-04-24 DIAGNOSIS — T8131XA Disruption of external operation (surgical) wound, not elsewhere classified, initial encounter: Secondary | ICD-10-CM | POA: Diagnosis not present

## 2013-04-24 NOTE — Telephone Encounter (Signed)
[facility] called to get verbal order to continue care for 1 more week. Gave verbal order.

## 2013-04-30 DIAGNOSIS — Z433 Encounter for attention to colostomy: Secondary | ICD-10-CM | POA: Diagnosis not present

## 2013-04-30 DIAGNOSIS — I4891 Unspecified atrial fibrillation: Secondary | ICD-10-CM | POA: Diagnosis not present

## 2013-04-30 DIAGNOSIS — T8131XA Disruption of external operation (surgical) wound, not elsewhere classified, initial encounter: Secondary | ICD-10-CM | POA: Diagnosis not present

## 2013-04-30 DIAGNOSIS — C189 Malignant neoplasm of colon, unspecified: Secondary | ICD-10-CM | POA: Diagnosis not present

## 2013-04-30 DIAGNOSIS — I5042 Chronic combined systolic (congestive) and diastolic (congestive) heart failure: Secondary | ICD-10-CM | POA: Diagnosis not present

## 2013-04-30 DIAGNOSIS — I509 Heart failure, unspecified: Secondary | ICD-10-CM | POA: Diagnosis not present

## 2013-05-29 ENCOUNTER — Other Ambulatory Visit: Payer: Self-pay | Admitting: Physical Medicine and Rehabilitation

## 2013-05-30 ENCOUNTER — Ambulatory Visit (HOSPITAL_BASED_OUTPATIENT_CLINIC_OR_DEPARTMENT_OTHER): Payer: Medicare Other | Admitting: Internal Medicine

## 2013-05-30 ENCOUNTER — Encounter: Payer: Self-pay | Admitting: Internal Medicine

## 2013-05-30 ENCOUNTER — Other Ambulatory Visit (HOSPITAL_BASED_OUTPATIENT_CLINIC_OR_DEPARTMENT_OTHER): Payer: Medicare Other

## 2013-05-30 VITALS — BP 131/88 | HR 112 | Temp 97.8°F | Resp 21 | Ht 70.0 in | Wt 318.0 lb

## 2013-05-30 DIAGNOSIS — C189 Malignant neoplasm of colon, unspecified: Secondary | ICD-10-CM

## 2013-05-30 DIAGNOSIS — I5033 Acute on chronic diastolic (congestive) heart failure: Secondary | ICD-10-CM

## 2013-05-30 DIAGNOSIS — I4891 Unspecified atrial fibrillation: Secondary | ICD-10-CM

## 2013-05-30 DIAGNOSIS — N179 Acute kidney failure, unspecified: Secondary | ICD-10-CM | POA: Diagnosis not present

## 2013-05-30 DIAGNOSIS — I472 Ventricular tachycardia: Secondary | ICD-10-CM

## 2013-05-30 DIAGNOSIS — C184 Malignant neoplasm of transverse colon: Secondary | ICD-10-CM

## 2013-05-30 DIAGNOSIS — E669 Obesity, unspecified: Secondary | ICD-10-CM

## 2013-05-30 DIAGNOSIS — G4730 Sleep apnea, unspecified: Secondary | ICD-10-CM

## 2013-05-30 DIAGNOSIS — J449 Chronic obstructive pulmonary disease, unspecified: Secondary | ICD-10-CM

## 2013-05-30 DIAGNOSIS — I4721 Torsades de pointes: Secondary | ICD-10-CM

## 2013-05-30 DIAGNOSIS — R609 Edema, unspecified: Secondary | ICD-10-CM

## 2013-05-30 DIAGNOSIS — J4489 Other specified chronic obstructive pulmonary disease: Secondary | ICD-10-CM

## 2013-05-30 LAB — CBC WITH DIFFERENTIAL/PLATELET
BASO%: 0.6 % (ref 0.0–2.0)
Basophils Absolute: 0 10*3/uL (ref 0.0–0.1)
EOS%: 6.5 % (ref 0.0–7.0)
Eosinophils Absolute: 0.3 10*3/uL (ref 0.0–0.5)
HCT: 40.1 % (ref 38.4–49.9)
HGB: 12.7 g/dL — ABNORMAL LOW (ref 13.0–17.1)
LYMPH%: 28.7 % (ref 14.0–49.0)
MCH: 27.9 pg (ref 27.2–33.4)
MCHC: 31.7 g/dL — ABNORMAL LOW (ref 32.0–36.0)
MCV: 87.9 fL (ref 79.3–98.0)
MONO#: 0.6 10*3/uL (ref 0.1–0.9)
MONO%: 12.6 % (ref 0.0–14.0)
NEUT#: 2.5 10*3/uL (ref 1.5–6.5)
NEUT%: 51.6 % (ref 39.0–75.0)
NRBC: 0 % (ref 0–0)
Platelets: 131 10*3/uL — ABNORMAL LOW (ref 140–400)
RBC: 4.56 10*6/uL (ref 4.20–5.82)
RDW: 13.9 % (ref 11.0–14.6)
WBC: 4.8 10*3/uL (ref 4.0–10.3)
lymph#: 1.4 10*3/uL (ref 0.9–3.3)

## 2013-05-30 LAB — COMPREHENSIVE METABOLIC PANEL (CC13)
ALBUMIN: 3.8 g/dL (ref 3.5–5.0)
ALK PHOS: 97 U/L (ref 40–150)
ALT: 13 U/L (ref 0–55)
AST: 16 U/L (ref 5–34)
Anion Gap: 9 mEq/L (ref 3–11)
BUN: 16.8 mg/dL (ref 7.0–26.0)
CALCIUM: 10 mg/dL (ref 8.4–10.4)
CHLORIDE: 107 meq/L (ref 98–109)
CO2: 23 mEq/L (ref 22–29)
Creatinine: 1.4 mg/dL — ABNORMAL HIGH (ref 0.7–1.3)
Glucose: 100 mg/dl (ref 70–140)
Potassium: 4 mEq/L (ref 3.5–5.1)
SODIUM: 140 meq/L (ref 136–145)
TOTAL PROTEIN: 7.5 g/dL (ref 6.4–8.3)
Total Bilirubin: 0.98 mg/dL (ref 0.20–1.20)

## 2013-05-30 NOTE — Progress Notes (Signed)
[place] OFFICE PROGRESS NOTE  Ron Parker, MD [address] [address]  DIAGNOSIS: Colon cancer  Acute on chronic diastolic heart failure  Acute renal failure  Atrial fibrillation  C O P D  Edema  SLEEP APNEA, OBSTRUCTIVE  OBESITY  Torsades de pointes  Chief Complaint  Patient presents with  . Follow-up    CURRENT TREATMENT: Active surveillance.  INTERVAL HISTORY: [name] 63 y.o. male who is presenting for further management and evaluation of his colon cancer. He has an extensive past medical history including hypertension, NSVT, COPD secondary longstanding tobacco abuse (Quit 2010) and recently Stage IIC Colon Cancer. He is here for follow up.  He was last seen by me on 04/01/2013.  He was recently admitted (02/02/2013 - 03/01/2013) to [facility] with subacute history of progressive weakness, constipation and difficulty eating and drinking. He presented with acute renal failure (Creatinine 7.83) and severe anemia (Hbg of 7.3) as well as obstruction. Colonoscopy doing this admission with biopsy revealed adenocarcinoma and patient underwent exploratory lap with partial colectomy with colostomy on 12/23 by Dr. Hulen Skains. Recovery was complicated by wound dehissance, requiring a return to the OR 12/29 with abdominal wound closure with VAC placment. After the procedure, the patient developed atrial tachycardia, so a dilt drip was started (12/29-1/2). On 1/4, the patient had a cardiac arrest, requiring defibrillation, and was subsequently transferred to the ICU. QT was found to be > 700, with mild hypokalemia and hypomagnesemia implicated as the cause. Lidocaine drip was started. Electrolyte abnormality treated and 2D-echo revealed an EF of 40-45% with periapical akinesis and biatrial enlargement. He experienced recurrent torsades on 01/05 and shocked in NSR. By 1/6, QTc had decreased to 499, lidocaine drip was d/c'ed, and  patient was transferred out of ICU to stepdown.   He was then admitted to physical medicine and rehabilitation on 01/09 due to severe deconditioning. He was continued on protein supplement to promote wound healing. His abdominal wound was treated with VAC through 03/05/13. His heart rate was controlled without recurrent arrythmia and was maintained on potassium and magnesium supplements. He was discharged to home on 03/07/2013. He was last evaluated by Dr. Hulen Skains on 02/03 with review of his pathology demonstrating invasive adenocarcinoma of the transverse colon with penetration into the pericolonic fat. He had 0/18 lymph nodes negative for metastatic disease. He was pathologic stage pT4N0M0. He was continued on his wet-to-dry dressings and scheduled for one month follow-up and evaluation by medical oncology.   Today, he reports for follow up with his wife.  He reports feeling well.  He has gained nearly 30 lbs over the past few months.  His abdominal wound is nearly completely healed.  He denies any hospitalizations or emergency room visits.   MEDICAL HISTORY: Past Medical History  Diagnosis Date  . Hypertension   . Hypercholesteremia   . CHF (congestive heart failure)     has diastolic heart failure grade 1; EF is 45 to 50% per echo 05/2011  . Coronary artery disease   . COPD (chronic obstructive pulmonary disease)   . Asthma   . Obesity   . Noncompliance   . NSVT (nonsustained ventricular tachycardia)     beta blocker restarted  . Atrial tachycardia     managed on beta blocker therapy    INTERIM HISTORY: has HYPERLIPIDEMIA; OBESITY; HYPERTENSION; CAD; CARDIOMYOPATHY, ISCHEMIC; ATRIAL TACHYCARDIA; ASTHMA; C O P D; IMPOTENCE OF ORGANIC ORIGIN; SLEEP APNEA, OBSTRUCTIVE; Edema; Tachycardia; Acute  renal failure; Hypotension; Chronic blood loss anemia; Atrial fibrillation; Colon cancer; Acute on chronic diastolic heart failure; Torsades de pointes; Physical deconditioning; and Postop check on his  problem list.    ALLERGIES:  has No Known Allergies.  MEDICATIONS: has a current medication list which includes the following prescription(s): aspirin ec, carvedilol, ferrous sulfate, hydrocodone-acetaminophen, levalbuterol, magnesium oxide, multivitamin with minerals, nitroglycerin, pantoprazole, potassium chloride sa, protein supplement, rosuvastatin, simethicone, and spironolactone.  SURGICAL HISTORY:  Past Surgical History  Procedure Laterality Date  . Coronary stent placement      x2  . Cardiac catheterization  11/12/2008    stent x 2 to RCA  . Colonoscopy N/A 02/08/2013    Procedure: COLONOSCOPY;  Surgeon: Theda Belfast, MD;  Location: [facility] ENDOSCOPY;  Service: Endoscopy;  Laterality: N/A;  . Esophagogastroduodenoscopy N/A 02/08/2013    Procedure: ESOPHAGOGASTRODUODENOSCOPY (EGD);  Surgeon: Theda Belfast, MD;  Location: [facility] ENDOSCOPY;  Service: Endoscopy;  Laterality: N/A;  . Laparotomy N/A 02/12/2013    Procedure: EXPLORATORY LAPAROTOMY PARTIAL COLECTOMY WITH COLOSTOMY;  Surgeon: Cherylynn Ridges, MD;  Location: [facility] OR;  Service: General;  Laterality: N/A;  . Laparotomy N/A 02/18/2013    Procedure: EXPLORATORY LAPAROTOMY/Closure of Wound;  Surgeon: Axel Filler, MD;  Location: [facility] OR;  Service: General;  Laterality: N/A;    REVIEW OF SYSTEMS:   Constitutional: Denies fevers, chills or abnormal weight loss Eyes: Denies blurriness of vision Ears, nose, mouth, throat, and face: Denies mucositis or sore throat Respiratory: Denies cough, dyspnea or wheezes Cardiovascular: Denies palpitation, chest discomfort or lower extremity swelling Gastrointestinal:  Denies nausea, heartburn or change in bowel habits Skin: Denies abnormal skin rashes Lymphatics: Denies new lymphadenopathy or easy bruising Neurological:Denies numbness, tingling or new weaknesses Behavioral/Psych: Mood is stable, no new changes  All other systems were reviewed with the patient and are negative.  PHYSICAL  EXAMINATION: ECOG PERFORMANCE STATUS: 1 - Symptomatic but completely ambulatory  Blood pressure 131/88, pulse 112, temperature 97.8 F (36.6 C), temperature source Oral, resp. rate 21, height 5\' 10"  (1.778 m), weight 318 lb (144.244 kg), SpO2 98.00%.  GENERAL:alert, no distress and comfortable; Morbidly obese.  SKIN: skin color, texture, turgor are normal, no rashes or significant lesions  EYES: normal, Conjunctiva are pink and non-injected, sclera clear  OROPHARYNX:no exudate, no erythema and lips, buccal mucosa, and tongue normal  NECK: supple, thyroid normal size, non-tender, without nodularity  LYMPH: no palpable lymphadenopathy in the cervical, axillary or inguinal  LUNGS: clear to auscultation and percussion with normal breathing effort  HEART: Tachycardia and no murmurs and 2+ woody edema  ABDOMEN:abdomen soft, non-tender and normal bowel sounds; CDI midline abdominal wound.  Musculoskeletal:no cyanosis of digits and no clubbing  NEURO: alert & oriented x 3 with fluent speech, no focal motor/sensory deficits   Labs:  Lab Results  Component Value Date   WBC 4.8 05/30/2013   HGB 12.7* 05/30/2013   HCT 40.1 05/30/2013   MCV 87.9 05/30/2013   PLT 131* 05/30/2013   NEUTROABS 2.5 05/30/2013      Chemistry      Component Value Date/Time   NA 140 05/30/2013 1210   NA 136 04/17/2013 1154   K 4.0 05/30/2013 1210   K 3.6 04/17/2013 1154   CL 103 04/17/2013 1154   CO2 23 05/30/2013 1210   CO2 24 04/17/2013 1154   BUN 16.8 05/30/2013 1210   BUN 12 04/17/2013 1154   CREATININE 1.4* 05/30/2013 1210   CREATININE 1.6* 04/17/2013 1154   CREATININE 1.31  03/26/2013 1225      Component Value Date/Time   CALCIUM 10.0 05/30/2013 1210   CALCIUM 10.1 04/17/2013 1154   ALKPHOS 97 05/30/2013 1210   ALKPHOS 97 03/26/2013 1225   AST 16 05/30/2013 1210   AST 11 03/26/2013 1225   ALT 13 05/30/2013 1210   ALT 9 03/26/2013 1225   BILITOT 0.98 05/30/2013 1210   BILITOT 1.1 03/26/2013 1225       Basic Metabolic Panel:  Recent  Labs Lab 05/30/13 1210  NA 140  K 4.0  CO2 23  GLUCOSE 100  BUN 16.8  CREATININE 1.4*  CALCIUM 10.0   GFR Estimated Creatinine Clearance: 80.6 ml/min (by C-G formula based on Cr of 1.4). Liver Function Tests:  Recent Labs Lab 05/30/13 1210  AST 16  ALT 13  ALKPHOS 97  BILITOT 0.98  PROT 7.5  ALBUMIN 3.8    CBC:  Recent Labs Lab 05/30/13 1210  WBC 4.8  NEUTROABS 2.5  HGB 12.7*  HCT 40.1  MCV 87.9  PLT 131*     Studies:  No results found.   RADIOGRAPHIC STUDIES: No results found.  ASSESSMENT: Yochanan L Mendosa 63 y.o. male with a history of Colon cancer  Acute on chronic diastolic heart failure  Acute renal failure  Atrial fibrillation  C O P D  Edema  SLEEP APNEA, OBSTRUCTIVE  OBESITY  Torsades de pointes   PLAN:  1. Colon Cancer.  --He is Stage IIC. Based on the size of his tumor and absence of nodal involvement (0/18 lymph nodes). High risk features include T4 disease and obstructive mass. Favorable risk features include lack of nodal involvement, greater than 10 lymph nodes examined, lack of venous invasion or perforation.  --Last visit, we recommended against adjuvant chemotherapy therapy due to his current functional status and the severity of his cardiac history given two significant cardiac events doing the recent hospitalizations, i.e., Torsades de points. We provided a detail overview of the principle os adjuvant chemotherapy and used Capecitabine as an example in the the Stage III setting based on Twelves C, et. Al, . NEJM 2005.   --We measured the marginal survival benefit in the setting of stage II disease, even with the high risk features as noted above, against the risk of further electrolyte abnormalities resulting from chemotherapy due to nausea or vomiting and diarrhea. The resulting magnitude of the magnesium deficit or dehydration could prompt further cardiac events which can be life-threatening. Furthermore, no existent,  consistent data predict benefit from adjuvant chemotherapy and the lack of clinical correlation between risk features and selection of chemotherapy in high risk stage II disease makes observation for this patient reasonable. The patient was in agreement. He understands the risk of recurrence is higher due to these high risk features.   --Today, his CEA is 1.3.  He denies any constitutional symptoms suggestive of recurrence. He has inquired about colostomy reversal.  We will refer him back to surgery for consideration.   2. Follow-up.  --We will follow-up in 3 months for a symptom check and labs including chemistries, CBC and CEA. We will follow CT chest/abdomen/pelvis annually for 3-5 years given his high risk features. Given his obstructive symptoms at presentation, he should have a colonoscopy per GI>   All questions were answered. The patient knows to call the clinic with any problems, questions or concerns. We can certainly see the patient much sooner if necessary.  I spent 15 minutes counseling the patient face to face. The total time  spent in the appointment was 25 minutes.    Concha Norway, MD 05/30/2013 1:21 PM

## 2013-05-31 ENCOUNTER — Telehealth: Payer: Self-pay | Admitting: Internal Medicine

## 2013-05-31 LAB — CEA: CEA: 1.3 ng/mL (ref 0.0–5.0)

## 2013-05-31 NOTE — Telephone Encounter (Signed)
s.w. pt and advised on July appt....pt ok and aware °

## 2013-06-07 ENCOUNTER — Ambulatory Visit: Payer: Federal, State, Local not specified - PPO | Admitting: Internal Medicine

## 2013-06-18 ENCOUNTER — Encounter: Payer: Self-pay | Admitting: Nurse Practitioner

## 2013-06-18 ENCOUNTER — Ambulatory Visit (INDEPENDENT_AMBULATORY_CARE_PROVIDER_SITE_OTHER): Payer: Medicare Other | Admitting: Nurse Practitioner

## 2013-06-18 VITALS — BP 120/80 | HR 94 | Ht 70.5 in | Wt 321.8 lb

## 2013-06-18 DIAGNOSIS — R0989 Other specified symptoms and signs involving the circulatory and respiratory systems: Secondary | ICD-10-CM | POA: Diagnosis not present

## 2013-06-18 DIAGNOSIS — R0609 Other forms of dyspnea: Secondary | ICD-10-CM

## 2013-06-18 DIAGNOSIS — I2589 Other forms of chronic ischemic heart disease: Secondary | ICD-10-CM

## 2013-06-18 DIAGNOSIS — I2510 Atherosclerotic heart disease of native coronary artery without angina pectoris: Secondary | ICD-10-CM | POA: Diagnosis not present

## 2013-06-18 DIAGNOSIS — I5041 Acute combined systolic (congestive) and diastolic (congestive) heart failure: Secondary | ICD-10-CM | POA: Diagnosis not present

## 2013-06-18 DIAGNOSIS — I255 Ischemic cardiomyopathy: Secondary | ICD-10-CM

## 2013-06-18 DIAGNOSIS — I10 Essential (primary) hypertension: Secondary | ICD-10-CM

## 2013-06-18 DIAGNOSIS — R0600 Dyspnea, unspecified: Secondary | ICD-10-CM

## 2013-06-18 LAB — MAGNESIUM: Magnesium: 2.3 mg/dL (ref 1.5–2.5)

## 2013-06-18 LAB — BASIC METABOLIC PANEL
BUN: 19 mg/dL (ref 6–23)
CO2: 24 mEq/L (ref 19–32)
Calcium: 10.2 mg/dL (ref 8.4–10.5)
Chloride: 105 mEq/L (ref 96–112)
Creatinine, Ser: 1.6 mg/dL — ABNORMAL HIGH (ref 0.4–1.5)
GFR: 55.71 mL/min — ABNORMAL LOW (ref >60.00)
Glucose, Bld: 81 mg/dL (ref 70–99)
Potassium: 4.2 mEq/L (ref 3.5–5.1)
Sodium: 137 mEq/L (ref 135–145)

## 2013-06-18 LAB — BRAIN NATRIURETIC PEPTIDE: Pro B Natriuretic peptide (BNP): 8 pg/mL (ref 0.0–100.0)

## 2013-06-18 NOTE — Patient Instructions (Addendum)
We will check an EKG today - this looks ok today  Stay on your current medicines  We will check labs today  See me in 4 months  Call the [place] office at [phone] if you have any questions, problems or concerns.

## 2013-06-18 NOTE — Progress Notes (Signed)
Larry [name] Date of Birth: [birth year] Medical Record #[id number]  History of Present Illness: Larry [name] is seen back today for a follow up visit. This is a 6 month check. Seen for Larry [name]. He has an ischemic CM with severe diffuse 3VD and prior stents to the RCA, diastolic dysfunction, HTN, HLD and obesity. Has had a history of atrial tach that has been treated with beta blocker. Was hospitalized back in April of 2013 with a HF exacerbation - had stopped his medicines. Last EF of 40 - to 32% with diastolic dysfunction noted in January of 2015.   Last seen by me back in October - Was out of his medicines again. Had lost weight and was anemic - was sending to GI and ended up having colon cancer.   He was admitted (02/02/2013 - 03/01/2013) to [facility] with subacute history of progressive weakness, constipation and difficulty eating and drinking. He presented with acute renal failure (Creatinine 7.83) and severe anemia (Hbg of 7.3) as well as obstruction. Colonoscopy doing this admission with biopsy revealed adenocarcinoma and patient underwent exploratory lap with partial colectomy with colostomy on 12/23 by Larry [name]. Recovery was complicated by wound dehissance, requiring a return to the OR 12/29 with abdominal wound closure with VAC placment. After the procedure, the patient developed atrial tachycardia, so a dilt drip was started (12/29-1/2). On 1/4, the patient had a cardiac arrest, requiring defibrillation, and was subsequently transferred to the ICU. QT was found to be > 700, with mild hypokalemia and hypomagnesemia implicated as the cause. Lidocaine drip was started. Electrolyte abnormality treated and 2D-echo revealed an EF of 40-45% with periapical akinesis and biatrial enlargement. He experienced recurrent torsades on 01/05 and shocked in NSR. By 1/6, QTc had decreased to 499, lidocaine drip was d/c'ed, and patient was transferred out of ICU to stepdown.  He was then  admitted to physical medicine and rehabilitation on 01/09 due to severe deconditioning. He was continued on protein supplement to promote wound healing. His abdominal wound was treated with VAC through 03/05/13. His heart rate was controlled without recurrent arrythmia and was maintained on potassium and magnesium supplements. He was discharged to home on 03/07/2013. He was last evaluated by Larry [name] on 02/03 with review of his pathology demonstrating invasive adenocarcinoma of the transverse colon with penetration into the pericolonic fat. He had 0/18 lymph nodes negative for metastatic disease. He was pathologic stage pT4N0M0.   Comes in today. Here alone. Doing ok. Still with some delayed wound healing from his surgery earlier this year. No chest pain. Not short of breath. Not dizzy. No palpitations. Back on his Coreg and aldactone. No longer on his ACE or diltiazem. BP ok. Weight is going up - he notes that he is eating well and not exercising or moving around very much. No swelling. Asking about seeing General Surgery for colostomy reversal. He feels like he is doing ok for the most part.   Current Outpatient Prescriptions  Medication Sig Dispense Refill  . aspirin EC 81 MG tablet Take 1 tablet (81 mg total) by mouth daily.  30 tablet  0  . carvedilol (COREG) 6.25 MG tablet Take 1 tablet (6.25 mg total) by mouth 2 (two) times daily with a meal.  60 tablet  1  . ferrous sulfate 325 (65 FE) MG tablet Take 1 tablet (325 mg total) by mouth daily with breakfast.  30 tablet  1  . HYDROcodone-acetaminophen (NORCO/VICODIN) 5-325 MG per tablet Take 1-2  tablets by mouth every 6 (six) hours as needed for moderate pain or severe pain.  45 tablet  0  . lactose free nutrition (BOOST) LIQD Take 237 mLs by mouth 2 (two) times daily.      [name] Kitchen levalbuterol (XOPENEX HFA) 45 MCG/ACT inhaler Inhale 1-2 puffs into the lungs every 4 (four) hours as needed for wheezing.      . magnesium oxide (MAG-OX) 400 (241.3 MG) MG  tablet Take 500 mg by mouth daily.      . Multiple Vitamin (MULTIVITAMIN WITH MINERALS) TABS tablet Take 1 tablet by mouth daily.      . nitroGLYCERIN (NITROSTAT) 0.4 MG SL tablet Place 0.4 mg under the tongue every 5 (five) minutes as needed for chest pain.      . pantoprazole (PROTONIX) 40 MG tablet Take 1 tablet (40 mg total) by mouth at bedtime.  30 tablet  1  . potassium chloride SA (K-DUR,KLOR-CON) 20 MEQ tablet Take 1 tablet (20 mEq total) by mouth 2 (two) times daily.  60 tablet  3  . rosuvastatin (CRESTOR) 40 MG tablet Take 40 mg by mouth daily.      . simethicone (GAS-X) 80 MG chewable tablet Chew 1 tablet (80 mg total) by mouth 4 (four) times daily.  120 tablet  0  . spironolactone (ALDACTONE) 25 MG tablet Take 1 tablet (25 mg total) by mouth daily.  30 tablet  1   No current facility-administered medications for this visit.    No Known Allergies  Past Medical History  Diagnosis Date  . Hypertension   . Hypercholesteremia   . CHF (congestive heart failure)     has diastolic heart failure grade 1; EF is 45 to 50% per echo 05/2011  . Coronary artery disease   . COPD (chronic obstructive pulmonary disease)   . Asthma   . Obesity   . Noncompliance   . NSVT (nonsustained ventricular tachycardia)     beta blocker restarted  . Atrial tachycardia     managed on beta blocker therapy    Past Surgical History  Procedure Laterality Date  . Coronary stent placement      x2  . Cardiac catheterization  11/12/2008    stent x 2 to RCA  . Colonoscopy N/A 02/08/2013    Procedure: COLONOSCOPY;  Surgeon: [name], MD;  Location: [place];  Service: Endoscopy;  Laterality: N/A;  . Esophagogastroduodenoscopy N/A 02/08/2013    Procedure: ESOPHAGOGASTRODUODENOSCOPY (EGD);  Surgeon: [name], MD;  Location: [facility] ENDOSCOPY;  Service: Endoscopy;  Laterality: N/A;  . Laparotomy N/A 02/12/2013    Procedure: EXPLORATORY LAPAROTOMY PARTIAL COLECTOMY WITH COLOSTOMY;  Surgeon: [name], MD;  Location: [place];  Service: General;  Laterality: N/A;  . Laparotomy N/A 02/18/2013    Procedure: EXPLORATORY LAPAROTOMY/Closure of Wound;  Surgeon: [name], MD;  Location: [place];  Service: General;  Laterality: N/A;    History  Smoking status  . Former Smoker  . Types: Cigarettes  . Quit date: 07/15/2008  Smokeless tobacco  . Never Used    History  Alcohol Use No    Comment: not since New Year's    Family History  Problem Relation Age of Onset  . Hypertension Father   . Heart disease Father   . Diabetes Father   . Hypertension Mother     Review of Systems: The review of systems is per the HPI.  All other systems were reviewed and are negative.  Physical Exam: BP  120/80  Pulse 94  Ht 5' 10.5" (1.791 m)  Wt 321 lb 12.8 oz (145.968 kg)  BMI 45.51 kg/m2  SpO2 96% Patient is very pleasant and in no acute distress. Skin is warm and dry. Color is normal.  HEENT is unremarkable. Normocephalic/atraumatic. PERRL. Sclera are nonicteric. Neck is supple. No masses. No JVD. Lungs are clear. Cardiac exam shows a regular rate and rhythm. Abdomen is soft. Extremities are without edema. Gait and ROM are intact. No gross neurologic deficits noted.  Wt Readings from Last 3 Encounters:  06/18/13 321 lb 12.8 oz (145.968 kg)  05/30/13 318 lb (144.244 kg)  04/01/13 283 lb 3.2 oz (128.459 kg)     LABORATORY DATA: EKG today with sinus rhythm, PVC QT of 366.  Lab Results  Component Value Date   WBC 4.8 05/30/2013   HGB 12.7* 05/30/2013   HCT 40.1 05/30/2013   PLT 131* 05/30/2013   GLUCOSE 100 05/30/2013   CHOL 130 12/06/2012   TRIG 77.0 12/06/2012   HDL 41.30 12/06/2012   LDLCALC 73 12/06/2012   ALT 13 05/30/2013   AST 16 05/30/2013   NA 140 05/30/2013   K 4.0 05/30/2013   CL 103 04/17/2013   CREATININE 1.4* 05/30/2013   BUN 16.8 05/30/2013   CO2 23 05/30/2013   TSH 0.70 12/06/2012   INR 1.08 02/12/2013   HGBA1C 6.0 11/26/2009   Echo Study Conclusions from January 2015  - Left  ventricle: The cavity size was mildly dilated. Wall thickness was increased in a pattern of mild LVH. Systolic function was mildly to moderately reduced. The estimated ejection fraction was in the range of 40% to 45%. There was akinesis of the apical lateral, apical anterior, and apical septal segments. There was akinesis of the true apex. Prominent trabeculation at the apex but I do not think thrombus was present. Patient had Definity study recently to look at the apex and no thrombus was seen. Doppler parameters are consistent with abnormal left ventricular relaxation (grade 1 diastolic dysfunction). - Aortic valve: Trileaflet; moderately calcified leaflets. There was no stenosis. - Mitral valve: Moderately calcified annulus. Mildly calcified leaflets . Trivial regurgitation. - Left atrium: The atrium was moderately dilated. - Right ventricle: The cavity size was normal. Systolic function was normal. - Right atrium: The atrium was mildly dilated. - Pulmonary arteries: No complete TR doppler jet so unable to estimate PA systolic pressure. - Systemic veins: IVC was not visualized. - Pericardium, extracardiac: A trivial pericardial effusion was identified. Impressions:  - Mildly dilated LV with mild LV hypertrophy. EF 40-45% with peri-apical akinesis. I do not think that LV apical thrombus is present. Normal RV size and systolic function. Biatrial enlargement.   Assessment / Plan:  1. Ischemic CM - EF of 40 to 45% - doing ok and looks compensated clinically - only on Coreg and aldactone. Will see back in a couple of months to recheck.  2. HTN - BP ok on current regimen.  3. Recent surgery for colon cancer complicated by renal failure & Torsades from prolonged QT - recheck potassium and MG today. EKG ok today  4. Obesity - gaining his weight back.  5. Colon cancer - followed by oncology at [facility] - asking about colostomy reversal. Looks like this may be in process   Patient  is agreeable to this plan and will call if any problems develop in the interim.   Burtis Junes, RN, [place] [address]  [place], [place]  [address] [phone]

## 2013-06-30 ENCOUNTER — Other Ambulatory Visit: Payer: Self-pay | Admitting: Physical Medicine and Rehabilitation

## 2013-07-03 ENCOUNTER — Other Ambulatory Visit: Payer: Self-pay | Admitting: Physical Medicine and Rehabilitation

## 2013-07-05 ENCOUNTER — Other Ambulatory Visit: Payer: Self-pay | Admitting: Physical Medicine and Rehabilitation

## 2013-07-08 ENCOUNTER — Telehealth: Payer: Self-pay | Admitting: *Deleted

## 2013-07-08 ENCOUNTER — Other Ambulatory Visit: Payer: Self-pay | Admitting: *Deleted

## 2013-07-08 MED ORDER — CARVEDILOL 6.25 MG PO TABS: 6.2500 mg | ORAL_TABLET | Freq: Two times a day (BID) | ORAL | Status: DC

## 2013-07-08 MED ORDER — PANTOPRAZOLE SODIUM 40 MG PO TBEC: 40.0000 mg | DELAYED_RELEASE_TABLET | Freq: Every day | ORAL | Status: DC

## 2013-07-08 MED ORDER — SPIRONOLACTONE 25 MG PO TABS: 25.0000 mg | ORAL_TABLET | Freq: Every day | ORAL | Status: DC

## 2013-07-08 NOTE — Telephone Encounter (Signed)
WIFE CALLED FOR REFILLS

## 2013-07-08 NOTE — Telephone Encounter (Signed)
PA to [facility] for pantoprazole

## 2013-07-11 NOTE — Telephone Encounter (Signed)
[employer] approved pantoprazole through 07/09/2014

## 2013-07-11 NOTE — Telephone Encounter (Signed)
Pharmacy notified.

## 2013-08-07 DIAGNOSIS — M79609 Pain in unspecified limb: Secondary | ICD-10-CM | POA: Diagnosis not present

## 2013-08-07 DIAGNOSIS — B351 Tinea unguium: Secondary | ICD-10-CM | POA: Diagnosis not present

## 2013-08-07 DIAGNOSIS — L608 Other nail disorders: Secondary | ICD-10-CM | POA: Diagnosis not present

## 2013-08-07 DIAGNOSIS — L97509 Non-pressure chronic ulcer of other part of unspecified foot with unspecified severity: Secondary | ICD-10-CM | POA: Diagnosis not present

## 2013-08-07 DIAGNOSIS — I739 Peripheral vascular disease, unspecified: Secondary | ICD-10-CM | POA: Diagnosis not present

## 2013-08-12 ENCOUNTER — Other Ambulatory Visit: Payer: Self-pay | Admitting: Nurse Practitioner

## 2013-08-12 ENCOUNTER — Telehealth: Payer: Self-pay | Admitting: Cardiovascular Disease

## 2013-08-21 ENCOUNTER — Encounter: Payer: Self-pay | Admitting: Cardiovascular Disease

## 2013-08-21 ENCOUNTER — Encounter (HOSPITAL_COMMUNITY): Payer: Self-pay | Admitting: *Deleted

## 2013-08-21 ENCOUNTER — Ambulatory Visit (INDEPENDENT_AMBULATORY_CARE_PROVIDER_SITE_OTHER): Payer: Medicare Other | Admitting: Cardiovascular Disease

## 2013-08-21 VITALS — BP 104/65 | HR 94 | Ht 70.0 in | Wt 345.0 lb

## 2013-08-21 DIAGNOSIS — I2510 Atherosclerotic heart disease of native coronary artery without angina pectoris: Secondary | ICD-10-CM | POA: Diagnosis not present

## 2013-08-21 DIAGNOSIS — IMO0002 Reserved for concepts with insufficient information to code with codable children: Secondary | ICD-10-CM

## 2013-08-21 DIAGNOSIS — L98499 Non-pressure chronic ulcer of skin of other sites with unspecified severity: Secondary | ICD-10-CM | POA: Diagnosis not present

## 2013-08-21 DIAGNOSIS — I739 Peripheral vascular disease, unspecified: Secondary | ICD-10-CM | POA: Diagnosis not present

## 2013-08-21 NOTE — Patient Instructions (Signed)
Your physician has requested that you have a lower extremity arterial duplex. This test is an ultrasound of the arteries in the legs. It looks at arterial blood flow in the legs and arms. Allow one hour for Lower Arterial scans. There are no restrictions or special instructions  Follow up with Dr.Berry as needed

## 2013-08-21 NOTE — Progress Notes (Signed)
08/21/2013 Larry [name]   [birth year]  [id number]  Primary Physician Ron Parker, MD Primary Cardiologist: Lorretta Harp MD [name]   HPI:  Larry [name] is a pleasant 63 year old severely overweight married African American male father of one child and currently does not work. His primary care physician is Dr. Yehuda Savannah, cardiologist is Dr. Rayann Heman and podiatrist is Dr. Trellis Paganini who referred him here for peripheral vascular evaluation. His cardiovascular risk factor profile is notable for 60 pack years of tobacco abuse having quit 5 years ago, treated hypertension and hyperlipidemia. He does have ischemic heart disease having had 2 stents placed because her back in 2010. He has ischemic heart myopathy with an EF in the 45% range. He denies claudication. He saw his podiatrist for routine foot care who noticed a small ulcer on the plantar surface of his right foot at the head of his right metatarsal. He was referred for peripheral vascular evaluation.   Current Outpatient Prescriptions  Medication Sig Dispense Refill  . aspirin EC 81 MG tablet Take 1 tablet (81 mg total) by mouth daily.  30 tablet  0  . carvedilol (COREG) 6.25 MG tablet Take 1 tablet (6.25 mg total) by mouth 2 (two) times daily with a meal.  60 tablet  6  . ferrous sulfate 325 (65 FE) MG tablet Take 1 tablet (325 mg total) by mouth daily with breakfast.  30 tablet  1  . HYDROcodone-acetaminophen (NORCO/VICODIN) 5-325 MG per tablet Take 1-2 tablets by mouth every 6 (six) hours as needed for moderate pain or severe pain.  45 tablet  0  . KLOR-CON M20 20 MEQ tablet TAKE 1 TABLET TWICE DAILY  60 tablet  3  . lactose free nutrition (BOOST) LIQD Take 237 mLs by mouth 2 (two) times daily.      [name] Kitchen levalbuterol (XOPENEX HFA) 45 MCG/ACT inhaler Inhale 1-2 puffs into the lungs every 4 (four) hours as needed for wheezing.      . magnesium oxide (MAG-OX) 400 (241.3 MG) MG tablet Take 500 mg by mouth daily.       . Multiple Vitamin (MULTIVITAMIN WITH MINERALS) TABS tablet Take 1 tablet by mouth daily.      . nitroGLYCERIN (NITROSTAT) 0.4 MG SL tablet Place 0.4 mg under the tongue every 5 (five) minutes as needed for chest pain.      . pantoprazole (PROTONIX) 40 MG tablet Take 1 tablet (40 mg total) by mouth at bedtime.  30 tablet  6  . rosuvastatin (CRESTOR) 40 MG tablet Take 40 mg by mouth daily.      . simethicone (GAS-X) 80 MG chewable tablet Chew 1 tablet (80 mg total) by mouth 4 (four) times daily.  120 tablet  0  . spironolactone (ALDACTONE) 25 MG tablet Take 1 tablet (25 mg total) by mouth daily.  30 tablet  6  . terbinafine (LAMISIL) 250 MG tablet Take 250 mg by mouth daily.       No current facility-administered medications for this visit.    No Known Allergies  History   Social History  . Marital Status: Married    Spouse Name: N/A    Number of Children: N/A  . Years of Education: N/A   Occupational History  . Not on file.   Social History Main Topics  . Smoking status: Former Smoker    Types: Cigarettes    Quit date: 07/15/2008  . Smokeless tobacco: Never Used  . Alcohol Use: No  Comment: not since New Year's  . Drug Use: No  . Sexual Activity: No   Other Topics Concern  . Not on file   Social History Narrative  . No narrative on file     Review of Systems: General: negative for chills, fever, night sweats or weight changes.  Cardiovascular: negative for chest pain, dyspnea on exertion, edema, orthopnea, palpitations, paroxysmal nocturnal dyspnea or shortness of breath Dermatological: negative for rash Respiratory: negative for cough or wheezing Urologic: negative for hematuria Abdominal: negative for nausea, vomiting, diarrhea, bright red blood per rectum, melena, or hematemesis Neurologic: negative for visual changes, syncope, or dizziness All other systems reviewed and are otherwise negative except as noted above.    Blood pressure 104/65, pulse 94,  height 5\' 10"  (1.778 m), weight 345 lb (156.491 kg).  General appearance: alert and no distress Neck: no adenopathy, no carotid bruit, no JVD, supple, symmetrical, trachea midline and thyroid not enlarged, symmetric, no tenderness/mass/nodules Lungs: clear to auscultation bilaterally Heart: regular rate and rhythm, S1, S2 normal, no murmur, click, rub or gallop Extremities: diminished pedal pulses bilaterally. Small shallow ulcer plantar surface right foot  EKG not performed today  ASSESSMENT AND PLAN:   Peripheral arterial disease Patient was referred to me by Dr. Trellis Paganini from [facility] for peripheral evaluation because of a small ulcer on the bottom of his right foot. The patient has a history of ischemic heart disease having had 2 stents placed in his heart 2010. He has ischemic myopathy with an EF in the 45% range. His other problems include treated hypertension and hyperlipidemia. He is not diabetic. He smoked 60 pack years and quit 5 years ago. He denies chest pain, shortness of breath or claudication. He was getting general foot care podiatrist noticed a small ulcer on the plantar surface of his right foot and referred him here for further evaluation      Lorretta Harp MD [facility], [facility] 08/21/2013 12:04 PM

## 2013-08-21 NOTE — Assessment & Plan Note (Signed)
Patient was referred to me by Dr. Trellis Paganini from [facility] for peripheral evaluation because of a small ulcer on the bottom of his right foot. The patient has a history of ischemic heart disease having had 2 stents placed in his heart 2010. He has ischemic myopathy with an EF in the 45% range. His other problems include treated hypertension and hyperlipidemia. He is not diabetic. He smoked 60 pack years and quit 5 years ago. He denies chest pain, shortness of breath or claudication. He was getting general foot care podiatrist noticed a small ulcer on the plantar surface of his right foot and referred him here for further evaluation

## 2013-08-21 NOTE — Telephone Encounter (Signed)
Closed encounter °

## 2013-08-27 ENCOUNTER — Inpatient Hospital Stay (HOSPITAL_COMMUNITY): Admission: RE | Admit: 2013-08-27 | Payer: Medicare Other | Source: Ambulatory Visit

## 2013-08-28 ENCOUNTER — Inpatient Hospital Stay (HOSPITAL_COMMUNITY): Admission: RE | Admit: 2013-08-28 | Payer: Medicare Other | Source: Ambulatory Visit

## 2013-08-29 ENCOUNTER — Ambulatory Visit (HOSPITAL_COMMUNITY)
Admission: RE | Admit: 2013-08-29 | Discharge: 2013-08-29 | Disposition: A | Discharge status: Home / Self Care | Payer: Medicare Other | Source: Ambulatory Visit | Attending: Cardiovascular Disease | Admitting: Cardiovascular Disease

## 2013-08-29 DIAGNOSIS — L97509 Non-pressure chronic ulcer of other part of unspecified foot with unspecified severity: Secondary | ICD-10-CM | POA: Diagnosis not present

## 2013-08-29 DIAGNOSIS — I739 Peripheral vascular disease, unspecified: Secondary | ICD-10-CM | POA: Insufficient documentation

## 2013-08-29 DIAGNOSIS — IMO0002 Reserved for concepts with insufficient information to code with codable children: Secondary | ICD-10-CM

## 2013-08-29 NOTE — Progress Notes (Signed)
Arterial Duplex Lower Ext. Completed. Wilson Dusenbery, BS, RDMS, RVT  

## 2013-08-30 ENCOUNTER — Encounter: Payer: Self-pay | Admitting: Internal Medicine

## 2013-08-30 ENCOUNTER — Other Ambulatory Visit (HOSPITAL_BASED_OUTPATIENT_CLINIC_OR_DEPARTMENT_OTHER): Payer: Medicare Other

## 2013-08-30 ENCOUNTER — Telehealth: Payer: Self-pay | Admitting: Internal Medicine

## 2013-08-30 ENCOUNTER — Ambulatory Visit (HOSPITAL_BASED_OUTPATIENT_CLINIC_OR_DEPARTMENT_OTHER): Payer: Medicare Other | Admitting: Internal Medicine

## 2013-08-30 VITALS — BP 117/66 | HR 94 | Temp 97.1°F | Resp 19 | Ht 70.0 in | Wt 341.8 lb

## 2013-08-30 DIAGNOSIS — C184 Malignant neoplasm of transverse colon: Secondary | ICD-10-CM

## 2013-08-30 DIAGNOSIS — C189 Malignant neoplasm of colon, unspecified: Secondary | ICD-10-CM | POA: Diagnosis not present

## 2013-08-30 LAB — CBC WITH DIFFERENTIAL/PLATELET
BASO%: 0.7 % (ref 0.0–2.0)
BASOS ABS: 0 10*3/uL (ref 0.0–0.1)
EOS ABS: 0.3 10*3/uL (ref 0.0–0.5)
EOS%: 5.2 % (ref 0.0–7.0)
HCT: 43.1 % (ref 38.4–49.9)
HEMOGLOBIN: 13.8 g/dL (ref 13.0–17.1)
LYMPH%: 25.3 % (ref 14.0–49.0)
MCH: 28.5 pg (ref 27.2–33.4)
MCHC: 31.9 g/dL — ABNORMAL LOW (ref 32.0–36.0)
MCV: 89.3 fL (ref 79.3–98.0)
MONO#: 0.6 10*3/uL (ref 0.1–0.9)
MONO%: 11.5 % (ref 0.0–14.0)
NEUT#: 3.1 10*3/uL (ref 1.5–6.5)
NEUT%: 57.3 % (ref 39.0–75.0)
PLATELETS: 128 10*3/uL — AB (ref 140–400)
RBC: 4.83 10*6/uL (ref 4.20–5.82)
RDW: 15.2 % — ABNORMAL HIGH (ref 11.0–14.6)
WBC: 5.5 10*3/uL (ref 4.0–10.3)
lymph#: 1.4 10*3/uL (ref 0.9–3.3)

## 2013-08-30 LAB — COMPREHENSIVE METABOLIC PANEL (CC13)
ALT: 16 U/L (ref 0–55)
AST: 13 U/L (ref 5–34)
Albumin: 3.8 g/dL (ref 3.5–5.0)
Alkaline Phosphatase: 94 U/L (ref 40–150)
Anion Gap: 9 mEq/L (ref 3–11)
BILIRUBIN TOTAL: 1.24 mg/dL — AB (ref 0.20–1.20)
BUN: 17 mg/dL (ref 7.0–26.0)
CO2: 22 mEq/L (ref 22–29)
CREATININE: 1.7 mg/dL — AB (ref 0.7–1.3)
Calcium: 10 mg/dL (ref 8.4–10.4)
Chloride: 110 mEq/L — ABNORMAL HIGH (ref 98–109)
GLUCOSE: 103 mg/dL (ref 70–140)
Potassium: 4 mEq/L (ref 3.5–5.1)
Sodium: 142 mEq/L (ref 136–145)
Total Protein: 7.6 g/dL (ref 6.4–8.3)

## 2013-08-30 LAB — LACTATE DEHYDROGENASE (CC13): LDH: 144 U/L (ref 125–245)

## 2013-08-30 NOTE — Telephone Encounter (Signed)
gv and printed aptps ched and avs for pt for OCT

## 2013-08-30 NOTE — Progress Notes (Signed)
[place] OFFICE PROGRESS NOTE  Ron Parker, MD [address] [place] [address]  DIAGNOSIS: Colon cancer - Plan: CBC with Differential, Basic metabolic panel (Bmet) - [facility], CEA, Ambulatory referral to General Surgery  Chief Complaint  Patient presents with  . Follow-up    CURRENT TREATMENT: Active surveillance.  INTERVAL HISTORY: Larry [name] 63 y.o. male who is presenting for further management and evaluation of his colon cancer. He has an extensive past medical history including hypertension, NSVT, COPD secondary longstanding tobacco abuse (Quit 2010) and recently Stage IIC Colon Cancer. He is here for follow up.  He was last seen by me on 05/30/2013.  He was admitted (02/02/2013 - 03/01/2013) to [facility] with subacute history of progressive weakness, constipation and difficulty eating and drinking. He presented with acute renal failure (Creatinine 7.83) and severe anemia (Hbg of 7.3) as well as obstruction. Colonoscopy doing this admission with biopsy revealed adenocarcinoma and patient underwent exploratory lap with partial colectomy with colostomy on 12/23 by Dr. Hulen Skains. Recovery was complicated by wound dehissance, requiring a return to the OR 12/29 with abdominal wound closure with VAC placment. After the procedure, the patient developed atrial tachycardia, so a dilt drip was started (12/29-1/2). On 1/4, the patient had a cardiac arrest, requiring defibrillation, and was subsequently transferred to the ICU. QT was found to be > 700, with mild hypokalemia and hypomagnesemia implicated as the cause. Lidocaine drip was started. Electrolyte abnormality treated and 2D-echo revealed an EF of 40-45% with periapical akinesis and biatrial enlargement. He experienced recurrent torsades on 01/05 and shocked in NSR. By 1/6, QTc had decreased to 499, lidocaine drip was d/c'ed, and patient was transferred out of ICU to stepdown.   He was then  admitted to physical medicine and rehabilitation on 01/09 due to severe deconditioning. He was continued on protein supplement to promote wound healing. His abdominal wound was treated with VAC through 03/05/13. His heart rate was controlled without recurrent arrythmia and was maintained on potassium and magnesium supplements. He was discharged to home on 03/07/2013. He was last evaluated by Dr. Hulen Skains on 02/03 with review of his pathology demonstrating invasive adenocarcinoma of the transverse colon with penetration into the pericolonic fat. He had 0/18 lymph nodes negative for metastatic disease. He was pathologic stage [id number]. He was continued on his wet-to-dry dressings and scheduled for one month follow-up and evaluation by medical oncology.   Today, he reports for follow up with his wife.  He reports feeling well.  He has gained nearly 30 lbs over the past several months.  His abdominal wound is nearly completely healed.  He denies any hospitalizations or emergency room visits. He has questions about colostomy reversal.   MEDICAL HISTORY: Past Medical History  Diagnosis Date  . Hypertension   . Hypercholesteremia   . CHF (congestive heart failure)     has diastolic heart failure grade 1; EF is 45 to 50% per echo 05/2011  . Coronary artery disease   . COPD (chronic obstructive pulmonary disease)   . Asthma   . Obesity   . Noncompliance   . NSVT (nonsustained ventricular tachycardia)     beta blocker restarted  . Atrial tachycardia     managed on beta blocker therapy  . Colon cancer   . Peripheral arterial disease     small ulcer the head of right metatarsal plantar surface right foot    INTERIM HISTORY: has HYPERLIPIDEMIA; OBESITY; HYPERTENSION; CAD; CARDIOMYOPATHY, ISCHEMIC; ATRIAL  TACHYCARDIA; ASTHMA; C O P D; IMPOTENCE OF ORGANIC ORIGIN; SLEEP APNEA, OBSTRUCTIVE; Edema; Tachycardia; Acute renal failure; Hypotension; Chronic blood loss anemia; Atrial fibrillation; Colon cancer; Acute on  chronic diastolic heart failure; Torsades de pointes; Physical deconditioning; Postop check; and Peripheral arterial disease on his problem list.    ALLERGIES:  has No Known Allergies.  MEDICATIONS: has a current medication list which includes the following prescription(s): aspirin ec, carvedilol, ferrous sulfate, hydrocodone-acetaminophen, klor-con m20, lactose free nutrition, levalbuterol, magnesium oxide, multivitamin with minerals, nitroglycerin, pantoprazole, rosuvastatin, simethicone, spironolactone, and terbinafine.  SURGICAL HISTORY:  Past Surgical History  Procedure Laterality Date  . Coronary stent placement      x2  . Cardiac catheterization  11/12/2008    stent x 2 to RCA  . Colonoscopy N/A 02/08/2013    Procedure: COLONOSCOPY;  Surgeon: [name], MD;  Location: [place];  Service: Endoscopy;  Laterality: N/A;  . Esophagogastroduodenoscopy N/A 02/08/2013    Procedure: ESOPHAGOGASTRODUODENOSCOPY (EGD);  Surgeon: [name], MD;  Location: [facility] ENDOSCOPY;  Service: Endoscopy;  Laterality: N/A;  . Laparotomy N/A 02/12/2013    Procedure: EXPLORATORY LAPAROTOMY PARTIAL COLECTOMY WITH COLOSTOMY;  Surgeon: [name], MD;  Location: [place];  Service: General;  Laterality: N/A;  . Laparotomy N/A 02/18/2013    Procedure: EXPLORATORY LAPAROTOMY/Closure of Wound;  Surgeon: [name], MD;  Location: [place];  Service: General;  Laterality: N/A;    REVIEW OF SYSTEMS:   Constitutional: Denies fevers, chills or abnormal weight loss Eyes: Denies blurriness of vision Ears, nose, mouth, throat, and face: Denies mucositis or sore throat Respiratory: Denies cough, dyspnea or wheezes Cardiovascular: Denies palpitation, chest discomfort or lower extremity swelling Gastrointestinal:  Denies nausea, heartburn or change in bowel habits Skin: Denies abnormal skin rashes Lymphatics: Denies new lymphadenopathy or easy bruising Neurological:Denies numbness, tingling or new  weaknesses Behavioral/Psych: Mood is stable, no new changes  All other systems were reviewed with the patient and are negative.  PHYSICAL EXAMINATION: ECOG PERFORMANCE STATUS: 1 - Symptomatic but completely ambulatory  Blood pressure 117/66, pulse 94, temperature 97.1 F (36.2 C), temperature source Oral, resp. rate 19, height 5\' 10"  (1.778 m), weight 341 lb 12.8 oz (155.039 kg), SpO2 100.00%.  GENERAL:alert, no distress and comfortable; Morbidly obese.  SKIN: skin color, texture, turgor are normal, no rashes or significant lesions  EYES: normal, Conjunctiva are pink and non-injected, sclera clear  OROPHARYNX:no exudate, no erythema and lips, buccal mucosa, and tongue normal  NECK: supple, thyroid normal size, non-tender, without nodularity  LYMPH: no palpable lymphadenopathy in the cervical, axillary or inguinal  LUNGS: clear to auscultation and percussion with normal breathing effort  HEART: Tachycardia and no murmurs and 2+ woody edema  ABDOMEN:abdomen soft, non-tender and normal bowel sounds; CDI midline abdominal wound.  Musculoskeletal:no cyanosis of digits and no clubbing  NEURO: alert & oriented x 3 with fluent speech, no focal motor/sensory deficits   Labs:  Lab Results  Component Value Date   WBC 5.5 08/30/2013   HGB 13.8 08/30/2013   HCT 43.1 08/30/2013   MCV 89.3 08/30/2013   PLT 128* 08/30/2013   NEUTROABS 3.1 08/30/2013      Chemistry      Component Value Date/Time   NA 142 08/30/2013 1316   NA 137 06/18/2013 1106   K 4.0 08/30/2013 1316   K 4.2 06/18/2013 1106   CL 105 06/18/2013 1106   CO2 22 08/30/2013 1316   CO2 24 06/18/2013 1106   BUN 17.0 08/30/2013 1316  BUN 19 06/18/2013 1106   CREATININE 1.7* 08/30/2013 1316   CREATININE 1.6* 06/18/2013 1106   CREATININE 1.31 03/26/2013 1225      Component Value Date/Time   CALCIUM 10.0 08/30/2013 1316   CALCIUM 10.2 06/18/2013 1106   ALKPHOS 94 08/30/2013 1316   ALKPHOS 97 03/26/2013 1225   AST 13 08/30/2013 1316   AST 11  03/26/2013 1225   ALT 16 08/30/2013 1316   ALT 9 03/26/2013 1225   BILITOT 1.24* 08/30/2013 1316   BILITOT 1.1 03/26/2013 1225       Basic Metabolic Panel:  Recent Labs Lab 08/30/13 1316  NA 142  K 4.0  CO2 22  GLUCOSE 103  BUN 17.0  CREATININE 1.7*  CALCIUM 10.0   GFR Estimated Creatinine Clearance: 69.2 ml/min (by C-G formula based on Cr of 1.7). Liver Function Tests:  Recent Labs Lab 08/30/13 1316  AST 13  ALT 16  ALKPHOS 94  BILITOT 1.24*  PROT 7.6  ALBUMIN 3.8    CBC:  Recent Labs Lab 08/30/13 1316  WBC 5.5  NEUTROABS 3.1  HGB 13.8  HCT 43.1  MCV 89.3  PLT 128*     Studies:  No results found.   RADIOGRAPHIC STUDIES: No results found.  ASSESSMENT: Jayant L Berke 63 y.o. male with a history of Colon cancer - Plan: CBC with Differential, Basic metabolic panel (Bmet) - [facility], CEA, Ambulatory referral to General Surgery   PLAN:  1. Colon Cancer.  --He is Stage IIC. Based on the size of his tumor and absence of nodal involvement (0/18 lymph nodes). High risk features include T4 disease and obstructive mass. Favorable risk features include lack of nodal involvement, greater than 10 lymph nodes examined, lack of venous invasion or perforation.  --On initial consultation, we recommended against adjuvant chemotherapy therapy due to his current functional status and the severity of his cardiac history given two significant cardiac events doing the recent hospitalizations, i.e., Torsades de points. We provided a detail overview of the principle os adjuvant chemotherapy and used Capecitabine as an example in the the Stage III setting based on Twelves C, et. Al, . NEJM 2005.   --We measured the marginal survival benefit in the setting of stage II disease, even with the high risk features as noted above, against the risk of further electrolyte abnormalities resulting from chemotherapy due to nausea or vomiting and diarrhea. The resulting magnitude of the magnesium  deficit or dehydration could prompt further cardiac events which can be life-threatening. Furthermore, no existent, consistent data predict benefit from adjuvant chemotherapy and the lack of clinical correlation between risk features and selection of chemotherapy in high risk stage II disease makes observation for this patient reasonable. The patient was in agreement. He understands the risk of recurrence is higher due to these high risk features.   --Today, his CEA is 1.2 down from 1.3.  He denies any constitutional symptoms suggestive of recurrence. He has inquired about colostomy reversal.  We will refer him back to surgery for consideration.   2. Follow-up.  --We will follow-up in 3 months for a symptom check and labs including chemistries, CBC and CEA. We will follow CT chest/abdomen/pelvis annually for 3-5 years given his high risk features.  Given his obstructive symptoms at presentation, he should have a colonoscopy per GI.  All questions were answered. The patient knows to call the clinic with any problems, questions or concerns. We can certainly see the patient much sooner if necessary.  I spent 15  minutes counseling the patient face to face. The total time spent in the appointment was 25 minutes.    Jiali Linney, MD 08/31/2013 9:41 AM

## 2013-08-31 LAB — CEA: CEA: 1.2 ng/mL (ref 0.0–5.0)

## 2013-09-03 ENCOUNTER — Encounter: Payer: Self-pay | Admitting: *Deleted

## 2013-09-04 DIAGNOSIS — B351 Tinea unguium: Secondary | ICD-10-CM | POA: Diagnosis not present

## 2013-09-04 DIAGNOSIS — M79609 Pain in unspecified limb: Secondary | ICD-10-CM | POA: Diagnosis not present

## 2013-09-30 ENCOUNTER — Encounter (HOSPITAL_COMMUNITY): Payer: Self-pay | Admitting: Emergency Medicine

## 2013-09-30 ENCOUNTER — Inpatient Hospital Stay (HOSPITAL_COMMUNITY)
Admission: EM | Admit: 2013-09-30 | Discharge: 2013-10-03 | DRG: 189 | Disposition: A | Discharge status: Home / Self Care | Payer: Medicare Other | Attending: Internal Medicine | Admitting: Internal Medicine

## 2013-09-30 ENCOUNTER — Emergency Department (HOSPITAL_COMMUNITY): Payer: Medicare Other

## 2013-09-30 DIAGNOSIS — Z85038 Personal history of other malignant neoplasm of large intestine: Secondary | ICD-10-CM

## 2013-09-30 DIAGNOSIS — Z933 Colostomy status: Secondary | ICD-10-CM | POA: Diagnosis not present

## 2013-09-30 DIAGNOSIS — I472 Ventricular tachycardia, unspecified: Secondary | ICD-10-CM | POA: Diagnosis present

## 2013-09-30 DIAGNOSIS — J984 Other disorders of lung: Secondary | ICD-10-CM | POA: Diagnosis not present

## 2013-09-30 DIAGNOSIS — I739 Peripheral vascular disease, unspecified: Secondary | ICD-10-CM | POA: Diagnosis present

## 2013-09-30 DIAGNOSIS — I517 Cardiomegaly: Secondary | ICD-10-CM | POA: Diagnosis not present

## 2013-09-30 DIAGNOSIS — Z9119 Patient's noncompliance with other medical treatment and regimen: Secondary | ICD-10-CM

## 2013-09-30 DIAGNOSIS — I10 Essential (primary) hypertension: Secondary | ICD-10-CM | POA: Diagnosis present

## 2013-09-30 DIAGNOSIS — C189 Malignant neoplasm of colon, unspecified: Secondary | ICD-10-CM | POA: Diagnosis present

## 2013-09-30 DIAGNOSIS — J449 Chronic obstructive pulmonary disease, unspecified: Secondary | ICD-10-CM | POA: Diagnosis not present

## 2013-09-30 DIAGNOSIS — Z7982 Long term (current) use of aspirin: Secondary | ICD-10-CM

## 2013-09-30 DIAGNOSIS — R0603 Acute respiratory distress: Secondary | ICD-10-CM | POA: Diagnosis present

## 2013-09-30 DIAGNOSIS — I5033 Acute on chronic diastolic (congestive) heart failure: Secondary | ICD-10-CM | POA: Diagnosis present

## 2013-09-30 DIAGNOSIS — Z6841 Body Mass Index (BMI) 40.0 and over, adult: Secondary | ICD-10-CM

## 2013-09-30 DIAGNOSIS — I2589 Other forms of chronic ischemic heart disease: Secondary | ICD-10-CM | POA: Diagnosis present

## 2013-09-30 DIAGNOSIS — J9600 Acute respiratory failure, unspecified whether with hypoxia or hypercapnia: Principal | ICD-10-CM | POA: Diagnosis present

## 2013-09-30 DIAGNOSIS — I2510 Atherosclerotic heart disease of native coronary artery without angina pectoris: Secondary | ICD-10-CM | POA: Diagnosis present

## 2013-09-30 DIAGNOSIS — Z79899 Other long term (current) drug therapy: Secondary | ICD-10-CM

## 2013-09-30 DIAGNOSIS — N179 Acute kidney failure, unspecified: Secondary | ICD-10-CM | POA: Diagnosis present

## 2013-09-30 DIAGNOSIS — E876 Hypokalemia: Secondary | ICD-10-CM | POA: Diagnosis present

## 2013-09-30 DIAGNOSIS — Z9049 Acquired absence of other specified parts of digestive tract: Secondary | ICD-10-CM | POA: Diagnosis not present

## 2013-09-30 DIAGNOSIS — I5043 Acute on chronic combined systolic (congestive) and diastolic (congestive) heart failure: Secondary | ICD-10-CM | POA: Diagnosis present

## 2013-09-30 DIAGNOSIS — R000 Tachycardia, unspecified: Secondary | ICD-10-CM | POA: Diagnosis present

## 2013-09-30 DIAGNOSIS — I4729 Other ventricular tachycardia: Secondary | ICD-10-CM | POA: Diagnosis present

## 2013-09-30 DIAGNOSIS — R0602 Shortness of breath: Secondary | ICD-10-CM | POA: Diagnosis not present

## 2013-09-30 DIAGNOSIS — J45901 Unspecified asthma with (acute) exacerbation: Secondary | ICD-10-CM | POA: Diagnosis present

## 2013-09-30 DIAGNOSIS — E7800 Pure hypercholesterolemia, unspecified: Secondary | ICD-10-CM | POA: Diagnosis present

## 2013-09-30 DIAGNOSIS — J441 Chronic obstructive pulmonary disease with (acute) exacerbation: Secondary | ICD-10-CM | POA: Diagnosis present

## 2013-09-30 DIAGNOSIS — Z9861 Coronary angioplasty status: Secondary | ICD-10-CM

## 2013-09-30 DIAGNOSIS — I5041 Acute combined systolic (congestive) and diastolic (congestive) heart failure: Secondary | ICD-10-CM | POA: Diagnosis present

## 2013-09-30 DIAGNOSIS — J45909 Unspecified asthma, uncomplicated: Secondary | ICD-10-CM | POA: Diagnosis present

## 2013-09-30 DIAGNOSIS — I509 Heart failure, unspecified: Secondary | ICD-10-CM | POA: Diagnosis present

## 2013-09-30 DIAGNOSIS — G4733 Obstructive sleep apnea (adult) (pediatric): Secondary | ICD-10-CM | POA: Diagnosis present

## 2013-09-30 DIAGNOSIS — Z91199 Patient's noncompliance with other medical treatment and regimen due to unspecified reason: Secondary | ICD-10-CM | POA: Diagnosis not present

## 2013-09-30 DIAGNOSIS — E785 Hyperlipidemia, unspecified: Secondary | ICD-10-CM | POA: Diagnosis present

## 2013-09-30 DIAGNOSIS — Z87891 Personal history of nicotine dependence: Secondary | ICD-10-CM

## 2013-09-30 DIAGNOSIS — R0989 Other specified symptoms and signs involving the circulatory and respiratory systems: Secondary | ICD-10-CM | POA: Diagnosis not present

## 2013-09-30 DIAGNOSIS — J4489 Other specified chronic obstructive pulmonary disease: Secondary | ICD-10-CM | POA: Diagnosis present

## 2013-09-30 LAB — CBC
HCT: 45.1 % (ref 39.0–52.0)
Hemoglobin: 14.8 g/dL (ref 13.0–17.0)
MCH: 29.2 pg (ref 26.0–34.0)
MCHC: 32.8 g/dL (ref 30.0–36.0)
MCV: 89 fL (ref 78.0–100.0)
PLATELETS: 141 10*3/uL — AB (ref 150–400)
RBC: 5.07 MIL/uL (ref 4.22–5.81)
RDW: 14.4 % (ref 11.5–15.5)
WBC: 8.4 10*3/uL (ref 4.0–10.5)

## 2013-09-30 LAB — TROPONIN I: Troponin I: <0.3 ng/mL (ref <0.30)

## 2013-09-30 LAB — BASIC METABOLIC PANEL
ANION GAP: 16 — AB (ref 5–15)
BUN: 14 mg/dL (ref 6–23)
CHLORIDE: 103 meq/L (ref 96–112)
CO2: 23 meq/L (ref 19–32)
Calcium: 10.2 mg/dL (ref 8.4–10.5)
Creatinine, Ser: 1.5 mg/dL — ABNORMAL HIGH (ref 0.50–1.35)
GFR calc Af Amer: 57 mL/min — ABNORMAL LOW (ref >90)
GFR calc non Af Amer: 49 mL/min — ABNORMAL LOW (ref >90)
Glucose, Bld: 135 mg/dL — ABNORMAL HIGH (ref 70–99)
Potassium: 4.2 mEq/L (ref 3.7–5.3)
SODIUM: 142 meq/L (ref 137–147)

## 2013-09-30 LAB — I-STAT CHEM 8, ED
BUN: 15 mg/dL (ref 6–23)
CHLORIDE: 105 meq/L (ref 96–112)
Calcium, Ion: 1.37 mmol/L — ABNORMAL HIGH (ref 1.13–1.30)
Creatinine, Ser: 1.7 mg/dL — ABNORMAL HIGH (ref 0.50–1.35)
Glucose, Bld: 143 mg/dL — ABNORMAL HIGH (ref 70–99)
HCT: 50 % (ref 39.0–52.0)
Hemoglobin: 17 g/dL (ref 13.0–17.0)
POTASSIUM: 4.1 meq/L (ref 3.7–5.3)
Sodium: 143 mEq/L (ref 137–147)
TCO2: 24 mmol/L (ref 0–100)

## 2013-09-30 LAB — CBC WITH DIFFERENTIAL/PLATELET
BASOS ABS: 0 10*3/uL (ref 0.0–0.1)
Basophils Relative: 1 % (ref 0–1)
Eosinophils Absolute: 0.5 10*3/uL (ref 0.0–0.7)
Eosinophils Relative: 6 % — ABNORMAL HIGH (ref 0–5)
HEMATOCRIT: 46.3 % (ref 39.0–52.0)
HEMOGLOBIN: 14.8 g/dL (ref 13.0–17.0)
Lymphocytes Relative: 41 % (ref 12–46)
Lymphs Abs: 3.5 10*3/uL (ref 0.7–4.0)
MCH: 29 pg (ref 26.0–34.0)
MCHC: 32 g/dL (ref 30.0–36.0)
MCV: 90.6 fL (ref 78.0–100.0)
MONO ABS: 0.9 10*3/uL (ref 0.1–1.0)
Monocytes Relative: 10 % (ref 3–12)
NEUTROS ABS: 3.7 10*3/uL (ref 1.7–7.7)
Neutrophils Relative %: 42 % — ABNORMAL LOW (ref 43–77)
Platelets: 169 10*3/uL (ref 150–400)
RBC: 5.11 MIL/uL (ref 4.22–5.81)
RDW: 14.4 % (ref 11.5–15.5)
WBC: 8.6 10*3/uL (ref 4.0–10.5)

## 2013-09-30 LAB — COMPREHENSIVE METABOLIC PANEL
ALBUMIN: 4.4 g/dL (ref 3.5–5.2)
ALT: 17 U/L (ref 0–53)
AST: 22 U/L (ref 0–37)
Alkaline Phosphatase: 106 U/L (ref 39–117)
Anion gap: 17 — ABNORMAL HIGH (ref 5–15)
BILIRUBIN TOTAL: 1.2 mg/dL (ref 0.3–1.2)
BUN: 15 mg/dL (ref 6–23)
CHLORIDE: 101 meq/L (ref 96–112)
CO2: 21 mEq/L (ref 19–32)
Calcium: 10.6 mg/dL — ABNORMAL HIGH (ref 8.4–10.5)
Creatinine, Ser: 1.49 mg/dL — ABNORMAL HIGH (ref 0.50–1.35)
GFR calc Af Amer: 57 mL/min — ABNORMAL LOW (ref >90)
GFR calc non Af Amer: 49 mL/min — ABNORMAL LOW (ref >90)
Glucose, Bld: 136 mg/dL — ABNORMAL HIGH (ref 70–99)
Potassium: 4.6 mEq/L (ref 3.7–5.3)
SODIUM: 139 meq/L (ref 137–147)
Total Protein: 8.4 g/dL — ABNORMAL HIGH (ref 6.0–8.3)

## 2013-09-30 LAB — MAGNESIUM: MAGNESIUM: 2.1 mg/dL (ref 1.5–2.5)

## 2013-09-30 LAB — PRO B NATRIURETIC PEPTIDE
Pro B Natriuretic peptide (BNP): 77.6 pg/mL (ref 0–125)
Pro B Natriuretic peptide (BNP): 397.2 pg/mL — ABNORMAL HIGH (ref 0–125)

## 2013-09-30 LAB — MRSA PCR SCREENING: MRSA by PCR: NEGATIVE

## 2013-09-30 LAB — APTT: aPTT: 29 seconds (ref 24–37)

## 2013-09-30 LAB — PROTIME-INR
INR: 0.97 (ref 0.00–1.49)
INR: 1 (ref 0.00–1.49)
Prothrombin Time: 12.9 seconds (ref 11.6–15.2)
Prothrombin Time: 13.2 seconds (ref 11.6–15.2)

## 2013-09-30 LAB — I-STAT TROPONIN, ED: TROPONIN I, POC: 0 ng/mL (ref 0.00–0.08)

## 2013-09-30 LAB — PHOSPHORUS: Phosphorus: 2.7 mg/dL (ref 2.3–4.6)

## 2013-09-30 LAB — D-DIMER, QUANTITATIVE (NOT AT ARMC): D-Dimer, Quant: 0.39 ug/mL-FEU (ref 0.00–0.48)

## 2013-09-30 MED ORDER — ACETAMINOPHEN 650 MG RE SUPP: 650.0000 mg | Freq: Four times a day (QID) | RECTAL | Status: DC | PRN

## 2013-09-30 MED ORDER — ALBUTEROL (5 MG/ML) CONTINUOUS INHALATION SOLN
10.0000 mg/h | INHALATION_SOLUTION | RESPIRATORY_TRACT | Status: DC
  Administered 2013-09-30: 10 mg/h via RESPIRATORY_TRACT
  Filled 2013-09-30: qty 20

## 2013-09-30 MED ORDER — ASPIRIN 81 MG PO CHEW
324.0000 mg | CHEWABLE_TABLET | Freq: Once | ORAL | Status: AC
  Administered 2013-09-30: 324 mg via ORAL
  Filled 2013-09-30: qty 4

## 2013-09-30 MED ORDER — MAGNESIUM OXIDE 400 (241.3 MG) MG PO TABS
400.0000 mg | ORAL_TABLET | Freq: Every day | ORAL | Status: DC
  Administered 2013-09-30 – 2013-10-03 (×4): 400 mg via ORAL
  Filled 2013-09-30 (×4): qty 1

## 2013-09-30 MED ORDER — ATORVASTATIN CALCIUM 80 MG PO TABS
80.0000 mg | ORAL_TABLET | Freq: Every day | ORAL | Status: DC
  Administered 2013-09-30 – 2013-10-02 (×3): 80 mg via ORAL
  Filled 2013-09-30 (×4): qty 1

## 2013-09-30 MED ORDER — LEVALBUTEROL HCL 1.25 MG/0.5ML IN NEBU
1.2500 mg | INHALATION_SOLUTION | Freq: Four times a day (QID) | RESPIRATORY_TRACT | Status: DC
  Administered 2013-09-30 – 2013-10-01 (×3): 1.25 mg via RESPIRATORY_TRACT
  Filled 2013-09-30 (×7): qty 0.5

## 2013-09-30 MED ORDER — POTASSIUM CHLORIDE CRYS ER 20 MEQ PO TBCR
20.0000 meq | EXTENDED_RELEASE_TABLET | Freq: Every day | ORAL | Status: DC
  Administered 2013-09-30 – 2013-10-03 (×4): 20 meq via ORAL
  Filled 2013-09-30 (×4): qty 1

## 2013-09-30 MED ORDER — CARVEDILOL 6.25 MG PO TABS
6.2500 mg | ORAL_TABLET | Freq: Two times a day (BID) | ORAL | Status: DC
  Administered 2013-09-30 – 2013-10-03 (×7): 6.25 mg via ORAL
  Filled 2013-09-30 (×9): qty 1

## 2013-09-30 MED ORDER — SODIUM CHLORIDE 0.9 % IV SOLN
1.0000 g | Freq: Once | INTRAVENOUS | Status: AC
  Administered 2013-09-30: 1 g via INTRAVENOUS
  Filled 2013-09-30: qty 10

## 2013-09-30 MED ORDER — ALBUTEROL SULFATE (2.5 MG/3ML) 0.083% IN NEBU: 2.5000 mg | INHALATION_SOLUTION | RESPIRATORY_TRACT | Status: DC

## 2013-09-30 MED ORDER — LEVALBUTEROL HCL 0.63 MG/3ML IN NEBU: 0.6300 mg | INHALATION_SOLUTION | RESPIRATORY_TRACT | Status: DC | PRN

## 2013-09-30 MED ORDER — TERBINAFINE HCL 250 MG PO TABS
250.0000 mg | ORAL_TABLET | Freq: Every day | ORAL | Status: DC
  Administered 2013-09-30 – 2013-10-03 (×4): 250 mg via ORAL
  Filled 2013-09-30 (×6): qty 1

## 2013-09-30 MED ORDER — HEPARIN SODIUM (PORCINE) 5000 UNIT/ML IJ SOLN
5000.0000 [IU] | Freq: Three times a day (TID) | INTRAMUSCULAR | Status: DC
  Administered 2013-09-30 – 2013-10-03 (×10): 5000 [IU] via SUBCUTANEOUS
  Filled 2013-09-30 (×13): qty 1

## 2013-09-30 MED ORDER — SIMETHICONE 80 MG PO CHEW
80.0000 mg | CHEWABLE_TABLET | Freq: Four times a day (QID) | ORAL | Status: DC
  Administered 2013-09-30 – 2013-10-03 (×12): 80 mg via ORAL
  Filled 2013-09-30 (×17): qty 1

## 2013-09-30 MED ORDER — FUROSEMIDE 10 MG/ML IJ SOLN
40.0000 mg | Freq: Two times a day (BID) | INTRAMUSCULAR | Status: DC
  Administered 2013-09-30 – 2013-10-02 (×5): 40 mg via INTRAVENOUS
  Filled 2013-09-30 (×8): qty 4

## 2013-09-30 MED ORDER — ONDANSETRON HCL 4 MG/2ML IJ SOLN: 4.0000 mg | Freq: Four times a day (QID) | INTRAMUSCULAR | Status: DC | PRN

## 2013-09-30 MED ORDER — METOPROLOL TARTRATE 1 MG/ML IV SOLN
INTRAVENOUS | Status: AC
  Filled 2013-09-30: qty 5

## 2013-09-30 MED ORDER — FERROUS SULFATE 325 (65 FE) MG PO TABS
325.0000 mg | ORAL_TABLET | Freq: Every day | ORAL | Status: DC
  Administered 2013-09-30 – 2013-10-03 (×4): 325 mg via ORAL
  Filled 2013-09-30 (×6): qty 1

## 2013-09-30 MED ORDER — METOPROLOL TARTRATE 1 MG/ML IV SOLN
2.5000 mg | Freq: Once | INTRAVENOUS | Status: AC
  Administered 2013-09-30: 2.5 mg via INTRAVENOUS

## 2013-09-30 MED ORDER — SPIRONOLACTONE 25 MG PO TABS
25.0000 mg | ORAL_TABLET | Freq: Every day | ORAL | Status: DC
  Administered 2013-09-30 – 2013-10-03 (×4): 25 mg via ORAL
  Filled 2013-09-30 (×4): qty 1

## 2013-09-30 MED ORDER — SODIUM CHLORIDE 0.9 % IV SOLN
Freq: Once | INTRAVENOUS | Status: AC
Start: 2013-09-30 — End: 2013-09-30
  Administered 2013-09-30: 75 mL via INTRAVENOUS

## 2013-09-30 MED ORDER — NITROGLYCERIN 0.4 MG SL SUBL: 0.4000 mg | SUBLINGUAL_TABLET | SUBLINGUAL | Status: DC | PRN

## 2013-09-30 MED ORDER — LEVALBUTEROL TARTRATE 45 MCG/ACT IN AERO: 1.0000 | INHALATION_SPRAY | RESPIRATORY_TRACT | Status: DC | PRN

## 2013-09-30 MED ORDER — RAMIPRIL 1.25 MG PO CAPS
1.2500 mg | ORAL_CAPSULE | Freq: Two times a day (BID) | ORAL | Status: DC
  Administered 2013-09-30 – 2013-10-03 (×7): 1.25 mg via ORAL
  Filled 2013-09-30 (×12): qty 1

## 2013-09-30 MED ORDER — ADULT MULTIVITAMIN W/MINERALS CH
1.0000 | ORAL_TABLET | Freq: Every day | ORAL | Status: DC
  Administered 2013-09-30 – 2013-10-03 (×4): 1 via ORAL
  Filled 2013-09-30 (×4): qty 1

## 2013-09-30 MED ORDER — METHYLPREDNISOLONE SODIUM SUCC 40 MG IJ SOLR
40.0000 mg | Freq: Four times a day (QID) | INTRAMUSCULAR | Status: DC
  Administered 2013-09-30 (×2): 40 mg via INTRAVENOUS
  Filled 2013-09-30 (×5): qty 1

## 2013-09-30 MED ORDER — LEVALBUTEROL HCL 1.25 MG/0.5ML IN NEBU
1.2500 mg | INHALATION_SOLUTION | Freq: Four times a day (QID) | RESPIRATORY_TRACT | Status: DC
  Filled 2013-09-30 (×5): qty 0.5

## 2013-09-30 MED ORDER — LEVALBUTEROL HCL 1.25 MG/0.5ML IN NEBU
1.2500 mg | INHALATION_SOLUTION | Freq: Four times a day (QID) | RESPIRATORY_TRACT | Status: DC
  Administered 2013-09-30: 1.25 mg via RESPIRATORY_TRACT
  Filled 2013-09-30 (×5): qty 0.5

## 2013-09-30 MED ORDER — METHYLPREDNISOLONE SODIUM SUCC 125 MG IJ SOLR
125.0000 mg | Freq: Once | INTRAMUSCULAR | Status: AC
  Administered 2013-09-30: 125 mg via INTRAVENOUS
  Filled 2013-09-30: qty 2

## 2013-09-30 MED ORDER — PANTOPRAZOLE SODIUM 40 MG PO TBEC
40.0000 mg | DELAYED_RELEASE_TABLET | Freq: Every day | ORAL | Status: DC
  Administered 2013-09-30 – 2013-10-02 (×3): 40 mg via ORAL
  Filled 2013-09-30 (×3): qty 1

## 2013-09-30 MED ORDER — ASPIRIN EC 81 MG PO TBEC
81.0000 mg | DELAYED_RELEASE_TABLET | Freq: Every day | ORAL | Status: DC
  Administered 2013-09-30 – 2013-10-03 (×4): 81 mg via ORAL
  Filled 2013-09-30 (×4): qty 1

## 2013-09-30 MED ORDER — HYDROCODONE-ACETAMINOPHEN 5-325 MG PO TABS: 1.0000 | ORAL_TABLET | Freq: Four times a day (QID) | ORAL | Status: DC | PRN

## 2013-09-30 MED ORDER — ONDANSETRON HCL 4 MG PO TABS: 4.0000 mg | ORAL_TABLET | Freq: Four times a day (QID) | ORAL | Status: DC | PRN

## 2013-09-30 MED ORDER — METHYLPREDNISOLONE SODIUM SUCC 40 MG IJ SOLR
40.0000 mg | Freq: Two times a day (BID) | INTRAMUSCULAR | Status: DC
  Administered 2013-10-01 – 2013-10-02 (×2): 40 mg via INTRAVENOUS
  Filled 2013-09-30 (×4): qty 1

## 2013-09-30 MED ORDER — BOOST PO LIQD
237.0000 mL | Freq: Two times a day (BID) | ORAL | Status: DC
  Administered 2013-09-30 – 2013-10-03 (×6): 237 mL via ORAL
  Filled 2013-09-30 (×9): qty 237

## 2013-09-30 MED ORDER — SODIUM CHLORIDE 0.9 % IJ SOLN
3.0000 mL | Freq: Two times a day (BID) | INTRAMUSCULAR | Status: DC
  Administered 2013-09-30 – 2013-10-03 (×7): 3 mL via INTRAVENOUS

## 2013-09-30 MED ORDER — ACETAMINOPHEN 325 MG PO TABS
650.0000 mg | ORAL_TABLET | Freq: Four times a day (QID) | ORAL | Status: DC | PRN
Start: 2013-09-30 — End: 2013-10-03

## 2013-09-30 NOTE — ED Notes (Signed)
Complaints of shortness of breath from patient. In triage, patient was diaphoretic, with initial O2 sat reading 88% on room air. Placed on Bipap and moved to room.

## 2013-09-30 NOTE — ED Provider Notes (Signed)
CSN: [id number]     Arrival date & time 09/30/13  0103 History   First MD Initiated Contact with Patient 09/30/13 0120     Chief Complaint  Patient presents with  . Shortness of Breath     (Consider location/radiation/quality/duration/timing/severity/associated sxs/prior Treatment) HPI Comments: Larry [name] is an 63 year old male with a history of hypertension, hypercholesterolemia and congestive heart failure as well as COPD. He had colon cancer and was operated on in December of 2014 with a diverting colostomy. His wife states that he has had some shortness of breath over the last week and has had to sleep sitting up because of orthopnea but this evening he became acutely short of breath and was in distress when she found him. He has been diaphoretic, there is no complaints of fever or coughing and he denies any peripheral edema but the gentleman is morbidly obese with swollen legs. The patient is unable to give me any history given his severe respiratory distress, initial oxygen saturations of 88%, rapid breathing, speaking in one-word sentences and requiring BiPAP on arrival.  Patient is a 63 y.o. male presenting with shortness of breath. The history is provided by the patient, the spouse and medical records.  Shortness of Breath   Past Medical History  Diagnosis Date  . Hypertension   . Hypercholesteremia   . CHF (congestive heart failure)     has diastolic heart failure grade 1; EF is 45 to 50% per echo 05/2011  . Coronary artery disease   . COPD (chronic obstructive pulmonary disease)   . Asthma   . Obesity   . Noncompliance   . NSVT (nonsustained ventricular tachycardia)     beta blocker restarted  . Atrial tachycardia     managed on beta blocker therapy  . Colon cancer   . Peripheral arterial disease     small ulcer the head of right metatarsal plantar surface right foot   Past Surgical History  Procedure Laterality Date  . Coronary stent placement      x2  . Cardiac  catheterization  11/12/2008    stent x 2 to RCA  . Colonoscopy N/A 02/08/2013    Procedure: COLONOSCOPY;  Surgeon: [name], MD;  Location: [place];  Service: Endoscopy;  Laterality: N/A;  . Esophagogastroduodenoscopy N/A 02/08/2013    Procedure: ESOPHAGOGASTRODUODENOSCOPY (EGD);  Surgeon: [name], MD;  Location: [facility] ENDOSCOPY;  Service: Endoscopy;  Laterality: N/A;  . Laparotomy N/A 02/12/2013    Procedure: EXPLORATORY LAPAROTOMY PARTIAL COLECTOMY WITH COLOSTOMY;  Surgeon: [name], MD;  Location: [place];  Service: General;  Laterality: N/A;  . Laparotomy N/A 02/18/2013    Procedure: EXPLORATORY LAPAROTOMY/Closure of Wound;  Surgeon: [name], MD;  Location: [facility] OR;  Service: General;  Laterality: N/A;   Family History  Problem Relation Age of Onset  . Hypertension Father   . Heart disease Father   . Diabetes Father   . Hypertension Mother    History  Substance Use Topics  . Smoking status: Former Smoker    Types: Cigarettes    Quit date: 07/15/2008  . Smokeless tobacco: Never Used  . Alcohol Use: No     Comment: not since New Year's    Review of Systems  Unable to perform ROS: Severe respiratory distress  Respiratory: Positive for shortness of breath.       Allergies  Review of patient's allergies indicates no known allergies.  Home Medications   Prior to Admission medications   Medication  Sig Start Date End Date Taking? Authorizing Provider  aspirin EC 81 MG tablet Take 1 tablet (81 mg total) by mouth daily. 03/06/13   Bary Leriche, PA-C  carvedilol (COREG) 6.25 MG tablet Take 1 tablet (6.25 mg total) by mouth 2 (two) times daily with a meal. 07/08/13   Thompson Grayer, MD  ferrous sulfate 325 (65 FE) MG tablet Take 1 tablet (325 mg total) by mouth daily with breakfast. 03/06/13   Bary Leriche, PA-C  HYDROcodone-acetaminophen (NORCO/VICODIN) 5-325 MG per tablet Take 1-2 tablets by mouth every 6 (six) hours as needed for moderate pain or severe  pain. 03/06/13   Bary Leriche, PA-C  KLOR-CON M20 20 MEQ tablet TAKE 1 TABLET TWICE DAILY 08/12/13   Burtis Junes, NP  lactose free nutrition (BOOST) LIQD Take 237 mLs by mouth 2 (two) times daily.    Historical Provider, MD  levalbuterol [facility] HFA) 45 MCG/ACT inhaler Inhale 1-2 puffs into the lungs every 4 (four) hours as needed for wheezing.    Historical Provider, MD  magnesium oxide (MAG-OX) 400 (241.3 MG) MG tablet Take 500 mg by mouth daily. 03/06/13   Bary Leriche, PA-C  Multiple Vitamin (MULTIVITAMIN WITH MINERALS) TABS tablet Take 1 tablet by mouth daily. 03/06/13   Bary Leriche, PA-C  nitroGLYCERIN (NITROSTAT) 0.4 MG SL tablet Place 0.4 mg under the tongue every 5 (five) minutes as needed for chest pain.    Historical Provider, MD  pantoprazole (PROTONIX) 40 MG tablet Take 1 tablet (40 mg total) by mouth at bedtime. 07/08/13   Thompson Grayer, MD  rosuvastatin (CRESTOR) 40 MG tablet Take 40 mg by mouth daily.    Historical Provider, MD  simethicone (GAS-X) 80 MG chewable tablet Chew 1 tablet (80 mg total) by mouth 4 (four) times daily. 03/06/13   Bary Leriche, PA-C  spironolactone (ALDACTONE) 25 MG tablet Take 1 tablet (25 mg total) by mouth daily. 07/08/13   Thompson Grayer, MD  terbinafine (LAMISIL) 250 MG tablet Take 250 mg by mouth daily.    Historical Provider, MD   BP 136/98  Pulse 140  Temp(Src) 97.4 F (36.3 C) (Temporal)  Resp 19  Ht 5\' 11"  (1.803 m)  Wt 320 lb (145.151 kg)  BMI 44.65 kg/m2  SpO2 100% Physical Exam  Nursing note and vitals reviewed. Constitutional: He appears well-developed and well-nourished. He appears distressed.  HENT:  Head: Normocephalic and atraumatic.  Mouth/Throat: Oropharynx is clear and moist. No oropharyngeal exudate.  Eyes: Conjunctivae and EOM are normal. Pupils are equal, round, and reactive to light. Right eye exhibits no discharge. Left eye exhibits no discharge. No scleral icterus.  Neck: Normal range of motion. Neck supple. No JVD  present. No thyromegaly present.  Cardiovascular: Regular rhythm, normal heart sounds and intact distal pulses.  Exam reveals no gallop and no friction rub.   No murmur heard. Tachycardic to 125.  Pulmonary/Chest: He is in respiratory distress. He has wheezes.  Decreased breath sounds bilaterally, increased work of breathing, accessory muscle use, prolonged expirations  Abdominal: Soft. Bowel sounds are normal. He exhibits no distension and no mass. There is no tenderness.  Large abdominal scar, well-healed, diverting colostomy present, no tenderness  Musculoskeletal: Normal range of motion. He exhibits edema (bbilateral 1+ symmetrical edema of the lower extremities). He exhibits no tenderness.  Lymphadenopathy:    He has no cervical adenopathy.  Neurological: He is alert. Coordination normal.  Skin: Skin is warm. No rash noted. He is diaphoretic. No  erythema.  Psychiatric: He has a normal mood and affect. His behavior is normal.    ED Course  Procedures (including critical care time) Labs Review Labs Reviewed  BASIC METABOLIC PANEL - Abnormal; Notable for the following:    Glucose, Bld 135 (*)    Creatinine, Ser 1.50 (*)    GFR calc non Af Amer 49 (*)    GFR calc Af Amer 57 (*)    Anion gap 16 (*)    All other components within normal limits  CBC WITH DIFFERENTIAL - Abnormal; Notable for the following:    Neutrophils Relative % 42 (*)    Eosinophils Relative 6 (*)    All other components within normal limits  I-STAT CHEM 8, ED - Abnormal; Notable for the following:    Creatinine, Ser 1.70 (*)    Glucose, Bld 143 (*)    Calcium, Ion 1.37 (*)    All other components within normal limits  APTT  PROTIME-INR  TROPONIN I  PRO B NATRIURETIC PEPTIDE  I-STAT TROPOININ, ED    Imaging Review Dg Chest Port 1 View  09/30/2013   CLINICAL DATA:  Shortness of breath.  EXAM: PORTABLE CHEST - 1 VIEW  COMPARISON:  Chest x-ray 02/19/2013.  FINDINGS: Lung volumes are low. Cephalization of  the pulmonary vasculature, without frank pulmonary edema. Film is underpenetrated, limiting the sensitivity and specificity of the examination. With this limitation in mind, there is no definite consolidative airspace disease and no definite pleural effusions. Heart size is mildly enlarged. The patient is rotated to the left on today's exam, resulting in distortion of the mediastinal contours and reduced diagnostic sensitivity and specificity for mediastinal pathology.  IMPRESSION: 1. Mild cardiomegaly with pulmonary venous congestion, but no frank pulmonary edema.   Electronically Signed   By: Vinnie Langton M.D.   On: 09/30/2013 01:50     EKG Interpretation   Date/Time:  Monday September 30 2013 01:09:07 EDT Ventricular Rate:  153 PR Interval:  123 QRS Duration: 101 QT Interval:  357 QTC Calculation: 570 R Axis:   90 Text Interpretation:  Sinus or ectopic atrial tachycardia Ventricular  premature complex Borderline right axis deviation Prolonged QT interval  Artifact in lead(s) III aVF V4 V5 Abnormal ekg Since last tracing T wave  inversion no longer seen. Confirmed by Sabra Heck  MD, [place] ([id number]) on  09/30/2013 1:25:33 AM      MDM   Final diagnoses:  Acute respiratory distress    The patient is in severe respiratory distress, requiring BiPAP, EKG shows no acute ST elevation changes, he likely has congestive heart failure, possibly COPD exacerbation, x-ray, labs, BiPAP, anticipated admission. The patient appears critically ill.  Has been necessary for hypoxia Continuous nebulizer treatment for  severe respiratory distress and likely COPD EKG without ischemia BNP normal, troponin normal Chest x-ray with venous congestion but no frank pulmonary edema  Discussed with hospitalist to admit to step down unit, patient critically ill  CRITICAL CARE Performed by: Johnna Acosta Total critical care time: 35 Critical care time was exclusive of separately billable procedures and treating  other patients. Critical care was necessary to treat or prevent imminent or life-threatening deterioration. Critical care was time spent personally by me on the following activities: development of treatment plan with patient and/or surrogate as well as nursing, discussions with consultants, evaluation of patient's response to treatment, examination of patient, obtaining history from patient or surrogate, ordering and performing treatments and interventions, ordering and review of laboratory studies, ordering and  review of radiographic studies, pulse oximetry and re-evaluation of patient's condition.  Meds given in ED:  Medications  albuterol (PROVENTIL,VENTOLIN) solution continuous neb (10 mg/hr Nebulization New Bag/Given 09/30/13 0138)  0.9 %  sodium chloride infusion (75 mLs Intravenous New Bag/Given 09/30/13 0153)  aspirin chewable tablet 324 mg (324 mg Oral Given 09/30/13 0131)  methylPREDNISolone sodium succinate (SOLU-MEDROL) 125 mg/2 mL injection 125 mg (125 mg Intravenous Given 09/30/13 0203)    New Prescriptions   No medications on file      Johnna Acosta, MD 09/30/13 0211

## 2013-09-30 NOTE — H&P (Signed)
Larry [name] is an 63 y.o. male.   Chief Complaint: Dyspnea. HPI: Pt awoke from sleep with extreme SOB at 12 midnight. Pt was in his usual state of health prior to going to bed. He did have emesis x 1 en route to the hospital. Wife denies that the patient had any shortness of breath, fever, chills, or cough prior to going to bed on the night of the 9th.  Past Medical History  Diagnosis Date  . Hypertension   . Hypercholesteremia   . CHF (congestive heart failure)     has diastolic heart failure grade 1; EF is 45 to 50% per echo 05/2011  . Coronary artery disease   . COPD (chronic obstructive pulmonary disease)   . Asthma   . Obesity   . Noncompliance   . NSVT (nonsustained ventricular tachycardia)     beta blocker restarted  . Atrial tachycardia     managed on beta blocker therapy  . Colon cancer   . Peripheral arterial disease     small ulcer the head of right metatarsal plantar surface right foot    Past Surgical History  Procedure Laterality Date  . Coronary stent placement      x2  . Cardiac catheterization  11/12/2008    stent x 2 to RCA  . Colonoscopy N/A 02/08/2013    Procedure: COLONOSCOPY;  Surgeon: [name], MD;  Location: [place];  Service: Endoscopy;  Laterality: N/A;  . Esophagogastroduodenoscopy N/A 02/08/2013    Procedure: ESOPHAGOGASTRODUODENOSCOPY (EGD);  Surgeon: [name], MD;  Location: [facility] ENDOSCOPY;  Service: Endoscopy;  Laterality: N/A;  . Laparotomy N/A 02/12/2013    Procedure: EXPLORATORY LAPAROTOMY PARTIAL COLECTOMY WITH COLOSTOMY;  Surgeon: [name], MD;  Location: [place];  Service: General;  Laterality: N/A;  . Laparotomy N/A 02/18/2013    Procedure: EXPLORATORY LAPAROTOMY/Closure of Wound;  Surgeon: [name], MD;  Location: [facility] OR;  Service: General;  Laterality: N/A;    Family History  Problem Relation Age of Onset  . Hypertension Father   . Heart disease Father   . Diabetes Father   . Hypertension Mother     Social History:  reports that he quit smoking about 5 years ago. His smoking use included Cigarettes. He smoked 0.00 packs per day. He has never used smokeless tobacco. He reports that he does not drink alcohol or use illicit drugs.  Allergies: No Known Allergies   (Not in a hospital admission)  Results for orders placed during the hospital encounter of 09/30/13 (from the past 48 hour(s))  BASIC METABOLIC PANEL     Status: Abnormal   Collection Time    09/30/13  1:15 AM      Result Value Ref Range   Sodium 142  137 - 147 mEq/L   Potassium 4.2  3.7 - 5.3 mEq/L   Chloride 103  96 - 112 mEq/L   CO2 23  19 - 32 mEq/L   Glucose, Bld 135 (*) 70 - 99 mg/dL   BUN 14  6 - 23 mg/dL   Creatinine, Ser 1.50 (*) 0.50 - 1.35 mg/dL   Calcium 10.2  8.4 - 10.5 mg/dL   GFR calc non Af Amer 49 (*) >90 mL/min   GFR calc Af Amer 57 (*) >90 mL/min   Comment: (NOTE)     The eGFR has been calculated using the CKD EPI equation.     This calculation has not been validated in all clinical situations.  eGFR's persistently <90 mL/min signify possible Chronic Kidney     Disease.   Anion gap 16 (*) 5 - 15  CBC WITH DIFFERENTIAL     Status: Abnormal   Collection Time    09/30/13  1:15 AM      Result Value Ref Range   WBC 8.6  4.0 - 10.5 K/uL   RBC 5.11  4.22 - 5.81 MIL/uL   Hemoglobin 14.8  13.0 - 17.0 g/dL   HCT 46.3  39.0 - 52.0 %   MCV 90.6  78.0 - 100.0 fL   MCH 29.0  26.0 - 34.0 pg   MCHC 32.0  30.0 - 36.0 g/dL   RDW 14.4  11.5 - 15.5 %   Platelets 169  150 - 400 K/uL   Neutrophils Relative % 42 (*) 43 - 77 %   Neutro Abs 3.7  1.7 - 7.7 K/uL   Lymphocytes Relative 41  12 - 46 %   Lymphs Abs 3.5  0.7 - 4.0 K/uL   Monocytes Relative 10  3 - 12 %   Monocytes Absolute 0.9  0.1 - 1.0 K/uL   Eosinophils Relative 6 (*) 0 - 5 %   Eosinophils Absolute 0.5  0.0 - 0.7 K/uL   Basophils Relative 1  0 - 1 %   Basophils Absolute 0.0  0.0 - 0.1 K/uL  APTT     Status: None   Collection Time     09/30/13  1:15 AM      Result Value Ref Range   aPTT 29  24 - 37 seconds  PROTIME-INR     Status: None   Collection Time    09/30/13  1:15 AM      Result Value Ref Range   Prothrombin Time 12.9  11.6 - 15.2 seconds   INR 0.97  0.00 - 1.49  TROPONIN I     Status: None   Collection Time    09/30/13  1:15 AM      Result Value Ref Range   Troponin I <0.30  <0.30 ng/mL   Comment:            Due to the release kinetics of cTnI,     a negative result within the first hours     of the onset of symptoms does not rule out     myocardial infarction with certainty.     If myocardial infarction is still suspected,     repeat the test at appropriate intervals.  PRO B NATRIURETIC PEPTIDE     Status: None   Collection Time    09/30/13  1:15 AM      Result Value Ref Range   Pro B Natriuretic peptide (BNP) 77.6  0 - 125 pg/mL  I-STAT TROPOININ, ED     Status: None   Collection Time    09/30/13  1:32 AM      Result Value Ref Range   Troponin i, poc 0.00  0.00 - 0.08 ng/mL   Comment 3            Comment: Due to the release kinetics of cTnI,     a negative result within the first hours     of the onset of symptoms does not rule out     myocardial infarction with certainty.     If myocardial infarction is still suspected,     repeat the test at appropriate intervals.  I-STAT CHEM 8, ED     Status: Abnormal  Collection Time    09/30/13  1:34 AM      Result Value Ref Range   Sodium 143  137 - 147 mEq/L   Potassium 4.1  3.7 - 5.3 mEq/L   Chloride 105  96 - 112 mEq/L   BUN 15  6 - 23 mg/dL   Creatinine, Ser 1.70 (*) 0.50 - 1.35 mg/dL   Glucose, Bld 143 (*) 70 - 99 mg/dL   Calcium, Ion 1.37 (*) 1.13 - 1.30 mmol/L   TCO2 24  0 - 100 mmol/L   Hemoglobin 17.0  13.0 - 17.0 g/dL   HCT 50.0  39.0 - 52.0 %   Dg Chest Port 1 View  09/30/2013   CLINICAL DATA:  Shortness of breath.  EXAM: PORTABLE CHEST - 1 VIEW  COMPARISON:  Chest x-ray 02/19/2013.  FINDINGS: Lung volumes are low. Cephalization of  the pulmonary vasculature, without frank pulmonary edema. Film is underpenetrated, limiting the sensitivity and specificity of the examination. With this limitation in mind, there is no definite consolidative airspace disease and no definite pleural effusions. Heart size is mildly enlarged. The patient is rotated to the left on today's exam, resulting in distortion of the mediastinal contours and reduced diagnostic sensitivity and specificity for mediastinal pathology.  IMPRESSION: 1. Mild cardiomegaly with pulmonary venous congestion, but no frank pulmonary edema.   Electronically Signed   By: Vinnie Langton M.D.   On: 09/30/2013 01:50    Review of Systems  Constitutional: Negative for fever, chills and malaise/fatigue.  HENT: Negative for congestion.   Eyes: Negative for blurred vision, double vision and redness.  Respiratory: Positive for cough, shortness of breath and wheezing. Negative for hemoptysis, sputum production and stridor.   Cardiovascular: Positive for palpitations, orthopnea, leg swelling and PND. Negative for chest pain.  Gastrointestinal: Positive for nausea and vomiting. Negative for heartburn, abdominal pain, diarrhea, constipation and blood in stool.  Genitourinary: Negative for dysuria, urgency and frequency.  Musculoskeletal: Negative for joint pain and neck pain.  Skin: Negative for itching and rash.  Neurological: Negative for dizziness, tremors, focal weakness, seizures, weakness and headaches.  Endo/Heme/Allergies: Negative for environmental allergies. Does not bruise/bleed easily.  Psychiatric/Behavioral: Negative for depression, suicidal ideas and substance abuse. The patient is not nervous/anxious and does not have insomnia.     Blood pressure 130/93, pulse 102, temperature 97.4 F (36.3 C), temperature source Temporal, resp. rate 20, height 5' 11"  (1.803 m), weight 145.151 kg (320 lb), SpO2 97.00%. Physical Exam  Constitutional: He is oriented to person, place,  and time. He appears well-developed and well-nourished.  Morbidly obese, chronically ill appearing. Pt is on bipap and is unable to assist with history and elements of the physical exam.  HENT:  Head: Normocephalic and atraumatic.  Eyes: Conjunctivae and EOM are normal. Pupils are equal, round, and reactive to light. No scleral icterus.  Neck: No JVD present. No tracheal deviation present. No thyromegaly present.  Cardiovascular: Normal heart sounds and intact distal pulses.  Exam reveals no gallop and no friction rub.   No murmur heard. Pt was tachycardic. No ectopy, no gallups. No lateral PMi. No thrills.  Respiratory: He is in respiratory distress. He has wheezes. He has rales. He exhibits no tenderness.  Increased work of breathing. No tactile fremitus.   GI: Soft. Bowel sounds are normal. He exhibits no distension and no mass. There is no tenderness. There is no rebound and no guarding.  Musculoskeletal: He exhibits edema.  2+ pitting edema of the lower  extremities bilaterally.  Lymphadenopathy:    He has no cervical adenopathy.  Neurological: He is alert and oriented to person, place, and time. He displays normal reflexes. No cranial nerve deficit. He exhibits normal muscle tone.  Skin: Skin is warm and dry. No rash noted. No erythema. No pallor.  Psychiatric: He has a normal mood and affect. His behavior is normal. Judgment and thought content normal.     Assessment/Plan 1. Respiratory distress with tachypnea and hypoxemia. Pt is tolerating Bipap well. 2. COPD exacerbation - Pt will receive IV steroids and nebulizer treatments. 3. CHF exacerbation with rales, peripheral edema and  Pulmonary vascular congestion. Pt will be diuresed and placed on an ACEI. 4. Acute renal insufficiency. Monitor creatinine carefully.  5. Hypocalcemia - supplement  I have spent 64 minutes on the admission and care of this patient. Marcello Tuzzolino 09/30/2013, 3:30 AM

## 2013-09-30 NOTE — ED Notes (Signed)
Pt much more relaxed, bi pap is helping per pt.  No longer diaphoretic.

## 2013-09-30 NOTE — Progress Notes (Signed)
Utilization review completed.  

## 2013-09-30 NOTE — Progress Notes (Signed)
This note also relates to the following rows which could not be included: SpO2 - Cannot attach notes to unvalidated device data   Patient treated at 1024

## 2013-09-30 NOTE — Progress Notes (Signed)
Pt taken off of BIPAP at this time (verbal MD order) and placed on remaining aerosol treatment. RT will continue to monitor.

## 2013-09-30 NOTE — ED Notes (Signed)
Report to [facility] on 3S.

## 2013-09-30 NOTE — ED Notes (Signed)
Patient arrived to room with use of oxygen support via Bipap for shortness of breath.  Dr. Sabra Heck at the bedside.

## 2013-09-30 NOTE — Progress Notes (Addendum)
[facility] TEAM 1 - Stepdown/ICU TEAM Progress Note  Larry [name] GYI:[id number] DOB: 05/24/1950 DOA: 09/30/2013 PCP: Ron Parker, MD  Admit HPI / Brief Narrative: 63 yo M who awoke from sleep with extreme SOB. Pt was in his usual state of health prior to going to bed. He did have emesis x 1 en route to the hospital. Wife denied that the patient had any shortness of breath, fever, chills, or cough prior to going to bed.   HPI/Subjective: Pt seen for f/u visit.  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  Hx of Colon CA s/p partial colectomy w/ ostomy Dec 2014  Code Status: FULL Family Communication: spoke w/ pt and wife at bedside  Disposition Plan: SDU over night as pt required BIPAP last night acutely - not yet safe for tele/med bed as acute resp decline could recurr  Consultants: none  Procedures: none  Antibiotics: none  DVT prophylaxis: SQ heparin   Objective: Blood pressure 135/86, pulse 96, temperature 98.1 F (36.7 C), temperature source Oral, resp. rate 19, height 5\' 11"  (1.803 m), weight 156.3 kg (344 lb 9.3 oz), SpO2 95.00%.  Intake/Output Summary (Last 24 hours) at 09/30/13 1129 Last data filed at 09/30/13 0900  Gross per 24 hour  Intake    100 ml  Output   1975 ml  Net  -1875 ml   Exam: F/U exam  completed  Data Reviewed: Basic Metabolic Panel:  Recent Labs Lab 09/30/13 0115 09/30/13 0134 09/30/13 0736  NA 142 143 139  K 4.2 4.1 4.6  CL 103 105 101  CO2 23  --  21  GLUCOSE 135* 143* 136*  BUN 14 15 15   CREATININE 1.50* 1.70* 1.49*  CALCIUM 10.2  --  10.6*    Liver Function Tests:  Recent Labs Lab 09/30/13 0736  AST 22  ALT 17  ALKPHOS 106  BILITOT 1.2  PROT 8.4*  ALBUMIN 4.4   Coags:  Recent Labs Lab 09/30/13 0115 09/30/13 0736  INR 0.97 1.00   CBC:  Recent Labs Lab 09/30/13 0115 09/30/13 0134 09/30/13 0736  WBC 8.6  --  8.4  NEUTROABS 3.7  --   --   HGB 14.8 17.0 14.8  HCT 46.3 50.0 45.1  MCV 90.6  --  89.0  PLT 169  --  141*   Cardiac Enzymes:  Recent Labs Lab 09/30/13 0115 09/30/13 0736  TROPONINI <0.30 <0.30   BNP (last 3 results)  Recent Labs  06/18/13 1106 09/30/13 0115 09/30/13 0736  PROBNP 8.0 77.6 397.2*    CBG: No results found for this basename: GLUCAP,  in the last 168 hours  Recent Results (from the past 240 hour(s))  MRSA PCR SCREENING     Status: None   Collection Time    09/30/13  4:52 AM      Result Value Ref Range Status   MRSA by PCR NEGATIVE  NEGATIVE Final   Comment:            The GeneXpert MRSA Assay (FDA     approved for NASAL specimens     only), is one component of a     comprehensive MRSA colonization     surveillance program. It is not     intended to diagnose MRSA     infection nor to guide or     monitor treatment for     MRSA infections.     Studies:  Recent x-ray studies have been reviewed in detail by the Attending Physician  Scheduled Meds:  Scheduled Meds: . aspirin EC  81 mg Oral Daily  . atorvastatin  80 mg Oral q1800  . carvedilol  6.25 mg Oral BID WC  . ferrous sulfate  325 mg Oral Q breakfast  . furosemide  40 mg Intravenous BID  . heparin  5,000 Units Subcutaneous 3 times per day  . lactose free nutrition  237 mL Oral BID  . levalbuterol  1.25 mg Nebulization Q6H   . magnesium oxide  400 mg Oral Daily  . methylPREDNISolone (SOLU-MEDROL) injection  40 mg Intravenous Q6H  . multivitamin with minerals  1 tablet Oral Daily  . pantoprazole  40 mg Oral QHS  . potassium chloride SA  20 mEq Oral Daily  . ramipril  1.25 mg Oral Q12H  . simethicone  80 mg Oral QID  . sodium chloride  3 mL Intravenous Q12H  . spironolactone  25 mg Oral Daily  . terbinafine  250 mg Oral Daily    Time spent on care of this patient: 25+ mins   Cherene Altes , MD   [facility] Office  [phone] Pager - Text Page per Shea Evans as per below:  On-Call/Text Page:      [email or web address]      password [email or web address]  If 7PM-7AM, please contact night-coverage [email or web address] Password [email or web address] 09/30/2013, 11:29 AM   LOS: 0 days

## 2013-09-30 NOTE — ED Notes (Signed)
Pt woke wife tonight stated he could not get his breath.  Extremely diaphoretic, labored breathing.  Upon arrival, O2 sat 88% on room air.

## 2013-10-01 DIAGNOSIS — I10 Essential (primary) hypertension: Secondary | ICD-10-CM

## 2013-10-01 DIAGNOSIS — N179 Acute kidney failure, unspecified: Secondary | ICD-10-CM

## 2013-10-01 DIAGNOSIS — J449 Chronic obstructive pulmonary disease, unspecified: Secondary | ICD-10-CM

## 2013-10-01 DIAGNOSIS — E785 Hyperlipidemia, unspecified: Secondary | ICD-10-CM

## 2013-10-01 DIAGNOSIS — I5033 Acute on chronic diastolic (congestive) heart failure: Secondary | ICD-10-CM

## 2013-10-01 DIAGNOSIS — G4733 Obstructive sleep apnea (adult) (pediatric): Secondary | ICD-10-CM

## 2013-10-01 LAB — CBC
HCT: 45.5 % (ref 39.0–52.0)
Hemoglobin: 14.7 g/dL (ref 13.0–17.0)
MCH: 29 pg (ref 26.0–34.0)
MCHC: 32.3 g/dL (ref 30.0–36.0)
MCV: 89.7 fL (ref 78.0–100.0)
PLATELETS: 157 10*3/uL (ref 150–400)
RBC: 5.07 MIL/uL (ref 4.22–5.81)
RDW: 14.7 % (ref 11.5–15.5)
WBC: 11.3 10*3/uL — AB (ref 4.0–10.5)

## 2013-10-01 LAB — BASIC METABOLIC PANEL
ANION GAP: 16 — AB (ref 5–15)
BUN: 22 mg/dL (ref 6–23)
CALCIUM: 10 mg/dL (ref 8.4–10.5)
CHLORIDE: 100 meq/L (ref 96–112)
CO2: 23 meq/L (ref 19–32)
Creatinine, Ser: 1.61 mg/dL — ABNORMAL HIGH (ref 0.50–1.35)
GFR calc non Af Amer: 45 mL/min — ABNORMAL LOW (ref >90)
GFR, EST AFRICAN AMERICAN: 52 mL/min — AB (ref >90)
Glucose, Bld: 136 mg/dL — ABNORMAL HIGH (ref 70–99)
Potassium: 4.3 mEq/L (ref 3.7–5.3)
Sodium: 139 mEq/L (ref 137–147)

## 2013-10-01 LAB — MAGNESIUM: Magnesium: 2.2 mg/dL (ref 1.5–2.5)

## 2013-10-01 MED ORDER — LEVALBUTEROL HCL 1.25 MG/0.5ML IN NEBU
1.2500 mg | INHALATION_SOLUTION | Freq: Two times a day (BID) | RESPIRATORY_TRACT | Status: DC
  Administered 2013-10-01 – 2013-10-02 (×3): 1.25 mg via RESPIRATORY_TRACT
  Filled 2013-10-01 (×6): qty 0.5

## 2013-10-01 NOTE — Progress Notes (Signed)
[facility] TEAM 1 - Stepdown/ICU TEAM Progress Note  Larry [name] XBM:[id number] DOB: [birth year] DOA: 09/30/2013 PCP: Ron Parker, MD  Admit HPI / Brief Narrative: 63 yo BM PMHx HTN, ischemic cardiomyopathy, HLD,OSA (does not use CPAP; states never diagnosed), COPD, asthma.  Who awoke from sleep with extreme SOB. Pt was in his usual state of health prior to going to bed. He did have emesis x 1 en route to the hospital. Wife denied that the patient had any shortness of breath, fever, chills, or cough prior to going to bed.    HPI/Subjective: 8/11 negative CP, negative SOB negative pedal edema. States hasn't ambulated around ward without DOE. States does not know his baseline weight, does not weigh himself daily. States smoked 1 PPD x30 years stopped approximately 5 years ago.   Assessment/Plan: Acute hypoxic respiratory failure  - Resolved -D-dimer negative -Given patient's noncompliance, most likely secondary to decompensated CHF -Ambulatory SpO2  Acute bronchospastic COPD exacerbation  No wheeze at time of exam today. Continue Solu-Medrol 40 mg BID  OSA -Patient claims has never been tested for obstructive sleep apnea, and does not have episodes of pause to respiration. -Counseled patient he is at increased risk considering his body habitus and pulmonary disease processes. 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  -Previous echo 1/4 EF 40-45% via -Patient has unknown baseline weight. Admission weight= 156.3 kg; 8/11 weight= 153.7 kg  -Continue daily a.m. Weight -May be nearing patient's baseline weight  Acute renal failure  -Cr on admission= 1.7 - baseline appears to be 1.3-1.6  -Cr on 8/11= 1.61 -Patient is -3.5 L since admission; continue  strict I&O     Hypocalcemia  -Resolved  Hx CAD s/p RCA stent x 2 2010  -Currently negative CP, in NSR: Stable  Hx of NSVT / Torsades arrest  In setting of hypokalemia and hypomagnesemia, monitor closely   HTN  -Mildly elevated -Continue Coreg 6.25 mg BID -Continue Lasix IV 40 mg BID -Continue Prempro 1.25 mg BID -Continue spironolactone 25 mg daily - HLD  -Continue Lipitor 80 mg daily -Obtain lipid panel  Obesity - Body mass index is 48.08 kg/(m^2).   Hx of Colon CA s/p partial colectomy w/ ostomy Dec 2014    Code Status: FULL Family Communication: no family present at time of exam Disposition Plan: Resolution CHF   Consultants: NA  Procedure/Significant Events: 1/4 Echocardiogram; - Left ventricle:  Mild LVH. -LVEF= 40% to 45%. akinesis of the apical lateral, apical anterior, and apical septal segments. Akinesis apex.  -(grade 1 diastolic dysfunction). - Left atrium: moderately dilated. - Right atrium:  mildly dilated.   Culture NA  Antibiotics: NA  DVT prophylaxis: Heparin subcutaneous   Devices NA   LINES / TUBES:  8/10 20ga left antecubital   Continuous Infusions:   Objective: VITAL SIGNS: Temp: 97.4 F (36.3 C) (08/11 1133) Temp src: Oral (08/11 1133) BP: 133/82 mmHg (08/11 1039) Pulse Rate: 112 (08/11 1133) SPO2; 96% on room air FIO2:   Intake/Output Summary (Last 24 hours) at 10/01/13 1426 Last data filed at 10/01/13 1039  Gross per 24 hour  Intake    480 ml  Output   1750 ml  Net  -1270 ml     Exam: General: A./O. x4, NAD, No acute respiratory distress Lungs:  Clear to auscultation bilaterally without wheezes or crackles Cardiovascular: Regular rate and rhythm without murmur gallop or rub normal S1 and S2 Abdomen: Morbidly obese, Nontender, nondistended, soft, bowel sounds positive, no rebound, no ascites, no appreciable mass Extremities: No significant cyanosis, clubbing, or edema bilateral lower extremities  Data  Reviewed: Basic Metabolic Panel:  Recent Labs Lab 09/30/13 0115 09/30/13 0134 09/30/13 0736 09/30/13 1330 10/01/13 0230  NA 142 143 139  --  139  K 4.2 4.1 4.6  --  4.3  CL 103 105 101  --  100  CO2 23  --  21  --  23  GLUCOSE 135* 143* 136*  --  136*  BUN 14 15 15   --  22  CREATININE 1.50* 1.70* 1.49*  --  1.61*  CALCIUM 10.2  --  10.6*  --  10.0  MG  --   --   --  2.1 2.2  PHOS  --   --   --  2.7  --    Liver Function Tests:  Recent Labs Lab 09/30/13 0736  AST 22  ALT 17  ALKPHOS 106  BILITOT 1.2  PROT 8.4*  ALBUMIN 4.4   No results found for this basename: LIPASE, AMYLASE,  in the last 168 hours No results found for this basename: AMMONIA,  in the last 168 hours CBC:  Recent Labs Lab 09/30/13 0115 09/30/13 0134 09/30/13 0736 10/01/13 0230  WBC 8.6  --  8.4 11.3*  NEUTROABS 3.7  --   --   --   HGB 14.8 17.0 14.8 14.7  HCT 46.3 50.0 45.1 45.5  MCV 90.6  --  89.0 89.7  PLT 169  --  141* 157   Cardiac Enzymes:  Recent Labs Lab 09/30/13 0115 09/30/13 0736 09/30/13 1330 09/30/13 1820  TROPONINI <0.30 <0.30 <0.30 <0.30   BNP (last 3 results)  Recent Labs  06/18/13 1106 09/30/13 0115 09/30/13 0736  PROBNP 8.0 77.6 397.2*   CBG: No results found for this basename: GLUCAP,  in the last 168 hours  Recent Results (from the past 240 hour(s))  MRSA PCR SCREENING     Status: None   Collection Time    09/30/13  4:52 AM      Result Value Ref Range Status   MRSA by PCR NEGATIVE  NEGATIVE Final   Comment:            The GeneXpert MRSA Assay (FDA     approved for NASAL specimens     only), is one component of a     comprehensive MRSA colonization     surveillance program. It is not     intended to diagnose MRSA     infection nor to guide or     monitor treatment for     MRSA infections.     Studies:  Recent x-ray studies have been reviewed in detail by the Attending Physician  Scheduled Meds:  Scheduled Meds: . aspirin EC  81 mg Oral  Daily  . atorvastatin  80 mg Oral q1800  . carvedilol  6.25 mg Oral BID WC  . ferrous sulfate  325 mg Oral Q breakfast  . furosemide  40 mg Intravenous BID  . heparin  5,000 Units Subcutaneous 3 times per day  . lactose free nutrition  237 mL Oral BID  . levalbuterol  1.25 mg Nebulization BID  . magnesium oxide  400 mg Oral Daily  . methylPREDNISolone (SOLU-MEDROL) injection  40 mg Intravenous Q12H  .  multivitamin with minerals  1 tablet Oral Daily  . pantoprazole  40 mg Oral QHS  . potassium chloride SA  20 mEq Oral Daily  . ramipril  1.25 mg Oral Q12H  . simethicone  80 mg Oral QID  . sodium chloride  3 mL Intravenous Q12H  . spironolactone  25 mg Oral Daily  . terbinafine  250 mg Oral Daily    Time spent on care of this patient: 40 mins   Allie Bossier , MD   [facility] Office  [phone] Pager [phone]  On-Call/Text Page:      [email or web address]      password [email or web address]  If 7PM-7AM, please contact night-coverage [email or web address] Password [email or web address] 10/01/2013, 2:26 PM   LOS: 1 day

## 2013-10-01 NOTE — Evaluation (Signed)
Physical Therapy Evaluation Patient Details Name: Larry [name] MRN: [id number] DOB: 08-31-1950 Today's Date: 10/01/2013   History of Present Illness  Pt admitted with SOB, CHF, and COPD exacerbation.    Clinical Impression  Patient independent with mobility, reports 2/4 DOE with increased activity. Patient educated on energy conservation techniques and pursed lip breathing for SOB. No further acute PT needs, will sign off.    Follow Up Recommendations No PT follow up    Equipment Recommendations  None recommended by PT    Recommendations for Other Services       Precautions / Restrictions Precautions Precaution Comments: Elevated HR with activity Restrictions Weight Bearing Restrictions: No      Mobility  Bed Mobility                  Transfers Overall transfer level: Independent Equipment used: None                Ambulation/Gait Ambulation/Gait assistance: Independent Ambulation Distance (Feet): 420 Feet Assistive device: None       General Gait Details: tolerated well, slight increase in HR, patient educated for rest breaks with activity  Science writer    Modified Rankin (Stroke Patients Only)       Balance Overall balance assessment: Independent                               Standardized Balance Assessment Standardized Balance Assessment : Dynamic Gait Index   Dynamic Gait Index Level Surface: Normal Change in Gait Speed: Normal Gait with Horizontal Head Turns: Normal Gait with Vertical Head Turns: Normal Gait and Pivot Turn: Normal Step Over Obstacle: Mild Impairment Step Around Obstacles: Mild Impairment       Pertinent Vitals/Pain Pain Assessment: No/denies pain    Home Living Family/patient expects to be discharged to:: Private residence Living Arrangements: Spouse/significant other Available Help at Discharge: Family;Available 24 hours/day Type of Home: House Home  Access: Level entry     Home Layout: Two level;Able to live on main level with bedroom/bathroom Home Equipment: Gilford Rile - 2 wheels;Cane - single point;Shower seat Additional Comments: pt was independent with all ADL's; has tub shower and reports he was able to stand and take shower; standard height commode    Prior Function Level of Independence: Independent         Comments: Patient watches TV throughout the day and reports stopping smoking in 2010 and had cardiac stents placed in 2010 and on disability in 2012.     Hand Dominance   Dominant Hand: Right    Extremity/Trunk Assessment   Upper Extremity Assessment: Overall WFL for tasks assessed           Lower Extremity Assessment: Overall WFL for tasks assessed      Cervical / Trunk Assessment: Normal  Communication   Communication: No difficulties  Cognition Arousal/Alertness: Awake/alert Behavior During Therapy: WFL for tasks assessed/performed Overall Cognitive Status: Within Functional Limits for tasks assessed       Memory: Decreased recall of precautions              General Comments General comments (skin integrity, edema, etc.): Pt with decreased ability to stand on one LE at a time but exhibits adequate stepping strategy to keep his balance.    Exercises        Assessment/Plan    PT Assessment Patent  does not need any further PT services  PT Diagnosis     PT Problem List    PT Treatment Interventions     PT Goals (Current goals can be found in the Care Plan section) Acute Rehab PT Goals PT Goal Formulation: No goals set, d/c therapy    Frequency     Barriers to discharge        Co-evaluation               End of Session Equipment Utilized During Treatment: Gait belt Activity Tolerance: Patient tolerated treatment well Patient left: in chair;with call bell/phone within reach;with family/visitor present Nurse Communication: Mobility status         Time: 0945-1006 PT  Time Calculation (min): 21 min   Charges:   PT Evaluation $Initial PT Evaluation Tier I: 1 Procedure PT Treatments $Gait Training: 8-22 mins   PT G CodesDuncan Dull 10/01/2013, 1:47 PM Alben Deeds, [place] DPT  [phone]

## 2013-10-01 NOTE — Evaluation (Signed)
Occupational Therapy Evaluation Patient Details Name: Larry [name] MRN: [id number] DOB: 06-05-1950 Today's Date: 10/01/2013    History of Present Illness Pt admitted with SOB, CHF, and COPD exacerbation.     Clinical Impression   Pt currently at functional baseline for ADLs at this time.  Does exhibit increased HR with 112 BPM at rest increasing to 141 with mobility and balance testing.  Pt is familiar with AE for LB selfcare and wife can assist at home.  Overall independent for basic selfcare tasks and functional transfer.  No further OT needs.     Follow Up Recommendations  No OT follow up    Equipment Recommendations  None recommended by OT       Precautions / Restrictions Precautions Precaution Comments: Elevated HR with activity Restrictions Weight Bearing Restrictions: No      Mobility Bed Mobility                  Transfers Overall transfer level: Independent Equipment used: None                  Balance Overall balance assessment: Independent                                          ADL Overall ADL's : At baseline                                       General ADL Comments: Pt with history of decreased ability to reach either LE during dressing tasks.  Has LH sponge for bathing at home as well as sockaide and LH shoe horn.  Discussed benefit of reacher as well for retrieving items from the floor as well as assisting with dressing tasks as needed.        Perception Perception Perception Tested?: No   Praxis Praxis Praxis tested?: Not tested    Pertinent Vitals/Pain Pain Assessment: No/denies pain     Hand Dominance Right   Extremity/Trunk Assessment Upper Extremity Assessment Upper Extremity Assessment: Overall WFL for tasks assessed   Lower Extremity Assessment Lower Extremity Assessment: Defer to PT evaluation   Cervical / Trunk Assessment Cervical / Trunk Assessment: Normal    Communication Communication Communication: No difficulties   Cognition Arousal/Alertness: Awake/alert Behavior During Therapy: WFL for tasks assessed/performed Overall Cognitive Status: Within Functional Limits for tasks assessed       Memory: Decreased recall of precautions                        Home Living Family/patient expects to be discharged to:: Private residence Living Arrangements: Spouse/significant other Available Help at Discharge: Family;Available 24 hours/day Type of Home: House Home Access: Level entry     Home Layout: Two level;Able to live on main level with bedroom/bathroom     Bathroom Shower/Tub: Occupational psychologist: Handicapped height Bathroom Accessibility: Yes   Home Equipment: Environmental consultant - 2 wheels;Cane - single point;Shower seat   Additional Comments: pt was independent with all ADL's; has tub shower and reports he was able to stand and take shower; standard height commode  End of Session Nurse Communication: Mobility status;Other (comment) (elevated HR)  Activity Tolerance: Other (comment) (Pt with elevated HR during mobility) Patient left: in chair;with family/visitor present;with call bell/phone within reach   Time: 8022-3361 OT Time Calculation (min): 19 min Charges:  OT General Charges $OT Visit: 1 Procedure OT Evaluation $Initial OT Evaluation Tier I: 1 Procedure OT Treatments $Self Care/Home Management : 8-22 mins G-Codes:    Keilani Terrance OTR/L 2013/10/27, 11:37 AM

## 2013-10-02 LAB — CBC
HCT: 46.1 % (ref 39.0–52.0)
Hemoglobin: 14.6 g/dL (ref 13.0–17.0)
MCH: 29.1 pg (ref 26.0–34.0)
MCHC: 31.7 g/dL (ref 30.0–36.0)
MCV: 92 fL (ref 78.0–100.0)
Platelets: 141 10*3/uL — ABNORMAL LOW (ref 150–400)
RBC: 5.01 MIL/uL (ref 4.22–5.81)
RDW: 14.8 % (ref 11.5–15.5)
WBC: 7.6 10*3/uL (ref 4.0–10.5)

## 2013-10-02 LAB — COMPREHENSIVE METABOLIC PANEL
ALBUMIN: 3.8 g/dL (ref 3.5–5.2)
ALK PHOS: 91 U/L (ref 39–117)
ALT: 14 U/L (ref 0–53)
ANION GAP: 15 (ref 5–15)
AST: 13 U/L (ref 0–37)
BUN: 25 mg/dL — AB (ref 6–23)
CALCIUM: 9.7 mg/dL (ref 8.4–10.5)
CO2: 24 mEq/L (ref 19–32)
Chloride: 98 mEq/L (ref 96–112)
Creatinine, Ser: 1.61 mg/dL — ABNORMAL HIGH (ref 0.50–1.35)
GFR calc non Af Amer: 45 mL/min — ABNORMAL LOW (ref >90)
GFR, EST AFRICAN AMERICAN: 52 mL/min — AB (ref >90)
GLUCOSE: 118 mg/dL — AB (ref 70–99)
Potassium: 4.2 mEq/L (ref 3.7–5.3)
SODIUM: 137 meq/L (ref 137–147)
TOTAL PROTEIN: 7.6 g/dL (ref 6.0–8.3)
Total Bilirubin: 0.9 mg/dL (ref 0.3–1.2)

## 2013-10-02 LAB — LIPID PANEL
CHOLESTEROL: 158 mg/dL (ref 0–200)
HDL: 67 mg/dL (ref >39)
LDL Cholesterol: 68 mg/dL (ref 0–99)
TRIGLYCERIDES: 117 mg/dL (ref <150)
Total CHOL/HDL Ratio: 2.4 RATIO
VLDL: 23 mg/dL (ref 0–40)

## 2013-10-02 LAB — MAGNESIUM: MAGNESIUM: 2.2 mg/dL (ref 1.5–2.5)

## 2013-10-02 MED ORDER — PREDNISONE 50 MG PO TABS
60.0000 mg | ORAL_TABLET | Freq: Every day | ORAL | Status: DC
  Administered 2013-10-03: 60 mg via ORAL
  Filled 2013-10-02 (×2): qty 1

## 2013-10-02 MED ORDER — FUROSEMIDE 40 MG PO TABS
40.0000 mg | ORAL_TABLET | Freq: Two times a day (BID) | ORAL | Status: DC
  Administered 2013-10-02 – 2013-10-03 (×2): 40 mg via ORAL
  Filled 2013-10-02 (×4): qty 1

## 2013-10-02 NOTE — Progress Notes (Signed)
[employer] TEAM 1 - Stepdown/ICU TEAM Progress Note  Larry [name] RCV:[id number] DOB: 29-Jun-1950 DOA: 09/30/2013 PCP: Ron Parker, MD  Admit HPI / Brief Narrative: 63 yo BM PMHx HTN, ischemic cardiomyopathy, HLD, OSA (does not use CPAP; states never diagnosed), COPD, asthma. Presented in respiratory distress. He required Bipap initially and quickly stabilized.  Subjective: Denies chest pain. Breathing is much better. No nausea or vomiting. Ambulated.   Assessment/Plan: Acute hypoxic respiratory failure  - Resolved -D-dimer negative -Given patient's noncompliance, most likely secondary to decompensated CHF though concomitant COPD cannot be ruled out.  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 Admission weight= 156.3 kg; 8/12 weight= 154.4 kg  -Diuresing well. Change to oral lasix. Continue Spironolactone as well.  Acute bronchospastic COPD exacerbation  Improved. Change to oral steroids.   Questionable OSA -Patient claims has never been tested for obstructive sleep apnea -He was asked to pursue testing for same as OP.  Mild Acute renal failure  Creatinine is stable. Possibly has CKD as well. OP f/u with PCP.  Hx CAD s/p RCA stent x 2 2010  Stable  Hx of NSVT / Torsades arrest in Past This was in the setting of hypokalemia and hypomagnesemia.  HTN  -Mildly elevated -Continue current medications.   HLD  -Continue Lipitor 80 mg daily  Obesity - Body mass index is 48.08 kg/(m^2).   Hx of Colon CA s/p partial colectomy w/ ostomy Dec 2014  DVT Prophylaxis: Heparin Code Status: FULL Family Communication: Discussed with wife and patient Disposition Plan: Possible DC in AM  Consultants: NA  Procedure/Significant Events: 02/24/2013 Echocardiogram; - Left ventricle:  Mild LVH. -LVEF= 40% to 45%. akinesis of the apical lateral, apical anterior, and  apical septal segments. Akinesis apex. (grade 1 diastolic dysfunction). Left atrium: moderately dilated. Right atrium: mildly dilated.  Culture NA  Antibiotics: NA  DVT prophylaxis: Heparin subcutaneous  Devices NA   LINES / TUBES:  8/10 20ga left antecubital  Objective: VITAL SIGNS: Temp: 97.2 F (36.2 C) (08/12 0618) Temp src: Oral (08/12 0618) BP: 125/76 mmHg (08/12 1047) Pulse Rate: 78 (08/12 1047) SPO2; 96% on room air FIO2:   Intake/Output Summary (Last 24 hours) at 10/02/13 1350 Last data filed at 10/02/13 1332  Gross per 24 hour  Intake   1260 ml  Output   3100 ml  Net  -1840 ml   Exam: General: Alert. Lungs: Clear to auscultation bilaterally without wheezes or crackles Cardiovascular: Regular rate and rhythm Abdomen: Soft, NT/ND. BS present. No masses. Colostomy noted.  Extremities: No significant edema bilateral lower extremities  Data Reviewed: Basic Metabolic Panel:  Recent Labs Lab 09/30/13 0115 09/30/13 0134 09/30/13 0736 09/30/13 1330 10/01/13 0230 10/02/13 0524  NA 142 143 139  --  139 137  K 4.2 4.1 4.6  --  4.3 4.2  CL 103 105 101  --  100 98  CO2 23  --  21  --  23 24  GLUCOSE 135* 143* 136*  --  136* 118*  BUN 14 15 15   --  22 25*  CREATININE 1.50* 1.70* 1.49*  --  1.61* 1.61*  CALCIUM 10.2  --  10.6*  --  10.0 9.7  MG  --   --   --  2.1 2.2 2.2  PHOS  --   --   --  2.7  --   --    Liver Function Tests:  Recent Labs Lab 09/30/13 0736 10/02/13 0524  AST 22 13  ALT 17 14  ALKPHOS 106 91  BILITOT 1.2 0.9  PROT 8.4* 7.6  ALBUMIN 4.4 3.8   CBC:  Recent Labs Lab 09/30/13 0115 09/30/13 0134 09/30/13 0736 10/01/13 0230 10/02/13 0524  WBC 8.6  --  8.4 11.3* 7.6  NEUTROABS 3.7  --   --   --   --   HGB 14.8 17.0 14.8 14.7 14.6  HCT 46.3 50.0 45.1 45.5 46.1  MCV 90.6  --  89.0 89.7 92.0  PLT 169  --  141* 157 141*   Cardiac Enzymes:  Recent Labs Lab 09/30/13 0115 09/30/13 0736 09/30/13 1330 09/30/13 1820    TROPONINI <0.30 <0.30 <0.30 <0.30   BNP (last 3 results)  Recent Labs  06/18/13 1106 09/30/13 0115 09/30/13 0736  PROBNP 8.0 77.6 397.2*   CBG: No results found for this basename: GLUCAP,  in the last 168 hours  Recent Results (from the past 240 hour(s))  MRSA PCR SCREENING     Status: None   Collection Time    09/30/13  4:52 AM      Result Value Ref Range Status   MRSA by PCR NEGATIVE  NEGATIVE Final   Comment:            The GeneXpert MRSA Assay (FDA     approved for NASAL specimens     only), is one component of a     comprehensive MRSA colonization     surveillance program. It is not     intended to diagnose MRSA     infection nor to guide or     monitor treatment for     MRSA infections.     Scheduled Meds:  Scheduled Meds: . aspirin EC  81 mg Oral Daily  . atorvastatin  80 mg Oral q1800  . carvedilol  6.25 mg Oral BID WC  . ferrous sulfate  325 mg Oral Q breakfast  . furosemide  40 mg Intravenous BID  . heparin  5,000 Units Subcutaneous 3 times per day  . lactose free nutrition  237 mL Oral BID  . levalbuterol  1.25 mg Nebulization BID  . magnesium oxide  400 mg Oral Daily  . methylPREDNISolone (SOLU-MEDROL) injection  40 mg Intravenous Q12H  . multivitamin with minerals  1 tablet Oral Daily  . pantoprazole  40 mg Oral QHS  . potassium chloride SA  20 mEq Oral Daily  . ramipril  1.25 mg Oral Q12H  . simethicone  80 mg Oral QID  . sodium chloride  3 mL Intravenous Q12H  . spironolactone  25 mg Oral Daily  . terbinafine  250 mg Oral Daily    Time spent on care of this patient: 69 mins   Bonnielee Haff , MD   [facility] Pager [phone]  If 7PM-7AM, please contact night-coverage [email or web address] Password [email or web address] 10/02/2013, 1:50 PM   LOS: 2 days

## 2013-10-02 NOTE — Progress Notes (Signed)
Report given to receiving RN. Patient in bed resting no verbal complaints and no signs or symptoms of distress noted.

## 2013-10-03 DIAGNOSIS — I5041 Acute combined systolic (congestive) and diastolic (congestive) heart failure: Secondary | ICD-10-CM | POA: Diagnosis present

## 2013-10-03 MED ORDER — RAMIPRIL 1.25 MG PO CAPS: 1.2500 mg | ORAL_CAPSULE | Freq: Two times a day (BID) | ORAL | Status: DC

## 2013-10-03 MED ORDER — PREDNISONE 20 MG PO TABS: ORAL_TABLET | ORAL | Status: DC

## 2013-10-03 MED ORDER — FUROSEMIDE 40 MG PO TABS: 40.0000 mg | ORAL_TABLET | Freq: Every day | ORAL | Status: DC

## 2013-10-03 NOTE — Discharge Instructions (Signed)

## 2013-10-03 NOTE — Discharge Summary (Signed)
[facility]  Physician Discharge Summary   Patient ID: Larry [name] MRN: [id number] DOB/AGE: 1951-01-01 63 y.o.  Admit date: 09/30/2013 Discharge date: 10/03/2013  PCP: Kristine Garbe, MD  DISCHARGE DIAGNOSES:  Active Problems:   HYPERLIPIDEMIA   HYPERTENSION   CAD   CARDIOMYOPATHY, ISCHEMIC   ASTHMA   C O P D   SLEEP APNEA, OBSTRUCTIVE   Tachycardia   Acute renal failure   Colon cancer   Acute on chronic diastolic heart failure   Acute respiratory distress   Respiratory distress   Acute combined systolic and diastolic congestive heart failure   RECOMMENDATIONS FOR OUTPATIENT FOLLOW UP: 1. Needs outpatient sleep study 2. Needs close monitoring of renal function as OP.  DISCHARGE CONDITION: fair  Diet recommendation: Heart healthy  Filed Weights   10/01/13 1821 10/02/13 0618 10/03/13 0658  Weight: 154.722 kg (341 lb 1.6 oz) 154.45 kg (340 lb 8 oz) 153.7 kg (338 lb 13.6 oz)    INITIAL HISTORY: 63 yo BM PMHx HTN, ischemic cardiomyopathy, HLD, OSA (does not use CPAP; states never diagnosed), COPD, asthma. Presented in respiratory distress. He required Bipap initially and quickly stabilized.  Consultations:  None  Procedures:  None  HOSPITAL COURSE:   Acute hypoxic respiratory failure  Resolved. This was thought to be secondary to CHF as discussed below. D-dimer negative. Concomitant COPD cannot be ruled out.   Acute exacerbation of combined ischemic systolic and chronic diastolic CHF  Echo earlier this year 02/2013 EF 40-45%. Patient has unknown baseline weight. Admission weight= 156.3 kg; 8/13 weight= 153.7 kg. He diuresed about 6 liters. He was changed to oral lasix and will need to take same at discharge. Continue Spironolactone as well. He was started on low dose ACEI and is tolerating it well.  Acute bronchospastic COPD exacerbation  This has improved. Taper steroids.   Questionable OSA  Patient claims has never been tested for  obstructive sleep apnea. He was asked to pursue testing for same as OP.   Mild Acute renal failure/Possible CDK Creatinine is stable. Possibly has CKD as well. Renal function will need to be monitored as outpatient.  Hx CAD s/p RCA stent x 2 2010  Stable   Hx of NSVT / Torsades arrest in Past  This was in the setting of hypokalemia and hypomagnesemia.   HTN  Continue current medications.   HLD  Continue Lipitor 80 mg daily   Obesity - Body mass index is 48.08 kg/(m^2).  Hx of Colon CA s/p partial colectomy w/ ostomy Dec 2014  Patient is much improved. He is stable for discharge.   PERTINENT LABS:  The results of significant diagnostics from this hospitalization (including imaging, microbiology, ancillary and laboratory) are listed below for reference.    Microbiology: Recent Results (from the past 240 hour(s))  MRSA PCR SCREENING     Status: None   Collection Time    09/30/13  4:52 AM      Result Value Ref Range Status   MRSA by PCR NEGATIVE  NEGATIVE Final   Comment:            The GeneXpert MRSA Assay (FDA     approved for NASAL specimens     only), is one component of a     comprehensive MRSA colonization     surveillance program. It is not     intended to diagnose MRSA     infection nor to guide or     monitor treatment for  MRSA infections.     Labs: Basic Metabolic Panel:  Recent Labs Lab 09/30/13 0115 09/30/13 0134 09/30/13 0736 09/30/13 1330 10/01/13 0230 10/02/13 0524  NA 142 143 139  --  139 137  K 4.2 4.1 4.6  --  4.3 4.2  CL 103 105 101  --  100 98  CO2 23  --  21  --  23 24  GLUCOSE 135* 143* 136*  --  136* 118*  BUN 14 15 15   --  22 25*  CREATININE 1.50* 1.70* 1.49*  --  1.61* 1.61*  CALCIUM 10.2  --  10.6*  --  10.0 9.7  MG  --   --   --  2.1 2.2 2.2  PHOS  --   --   --  2.7  --   --    Liver Function Tests:  Recent Labs Lab 09/30/13 0736 10/02/13 0524  AST 22 13  ALT 17 14  ALKPHOS 106 91  BILITOT 1.2 0.9  PROT 8.4* 7.6    ALBUMIN 4.4 3.8   CBC:  Recent Labs Lab 09/30/13 0115 09/30/13 0134 09/30/13 0736 10/01/13 0230 10/02/13 0524  WBC 8.6  --  8.4 11.3* 7.6  NEUTROABS 3.7  --   --   --   --   HGB 14.8 17.0 14.8 14.7 14.6  HCT 46.3 50.0 45.1 45.5 46.1  MCV 90.6  --  89.0 89.7 92.0  PLT 169  --  141* 157 141*   Cardiac Enzymes:  Recent Labs Lab 09/30/13 0115 09/30/13 0736 09/30/13 1330 09/30/13 1820  TROPONINI <0.30 <0.30 <0.30 <0.30   BNP: BNP (last 3 results)  Recent Labs  06/18/13 1106 09/30/13 0115 09/30/13 0736  PROBNP 8.0 77.6 397.2*    IMAGING STUDIES Dg Chest Port 1 View  09/30/2013   CLINICAL DATA:  Shortness of breath.  EXAM: PORTABLE CHEST - 1 VIEW  COMPARISON:  Chest x-ray 02/19/2013.  FINDINGS: Lung volumes are low. Cephalization of the pulmonary vasculature, without frank pulmonary edema. Film is underpenetrated, limiting the sensitivity and specificity of the examination. With this limitation in mind, there is no definite consolidative airspace disease and no definite pleural effusions. Heart size is mildly enlarged. The patient is rotated to the left on today's exam, resulting in distortion of the mediastinal contours and reduced diagnostic sensitivity and specificity for mediastinal pathology.  IMPRESSION: 1. Mild cardiomegaly with pulmonary venous congestion, but no frank pulmonary edema.   Electronically Signed   By: Vinnie Langton M.D.   On: 09/30/2013 01:50    DISCHARGE EXAMINATION: Filed Vitals:   10/02/13 2141 10/03/13 0248 10/03/13 0658 10/03/13 1144  BP: 127/84 99/72 128/69 127/79  Pulse:  81 83 87  Temp: 98 F (36.7 C) 98 F (36.7 C) 97.5 F (36.4 C)   TempSrc: Oral Oral Oral   Resp: 20 20 18    Height:      Weight:   153.7 kg (338 lb 13.6 oz)   SpO2: 95%  96%    General appearance: alert, cooperative, appears stated age and no distress Resp: clear to auscultation bilaterally Cardio: regular rate and rhythm, S1, S2 normal, no murmur, click, rub or  gallop GI: soft, non-tender; bowel sounds normal; no masses,  no organomegaly Extremities: minimal edema Neurologic: Alert and oriented X 3, normal strength and tone. Normal symmetric reflexes. Normal coordination and gait  DISPOSITION: Home   Discharge Instructions   Call MD for:  difficulty breathing, headache or visual disturbances    Complete  by:  As directed      Call MD for:  persistant dizziness or light-headedness    Complete by:  As directed      Call MD for:  persistant nausea and vomiting    Complete by:  As directed      Call MD for:  severe uncontrolled pain    Complete by:  As directed      Diet - low sodium heart healthy    Complete by:  As directed      Discharge instructions    Complete by:  As directed   Restrict fluids to 1576ml per day. See other discharge instructions     Increase activity slowly    Complete by:  As directed            ALLERGIES: No Known Allergies  Current Discharge Medication List    START taking these medications   Details  furosemide (LASIX) 40 MG tablet Take 1 tablet (40 mg total) by mouth daily. Qty: 30 tablet, Refills: 2    predniSONE (DELTASONE) 20 MG tablet Take 2 tablets once daily for 3 days, then take 1 tablet once daily for 3 days, then stop Qty: 9 tablet, Refills: 0    ramipril (ALTACE) 1.25 MG capsule Take 1 capsule (1.25 mg total) by mouth every 12 (twelve) hours. Qty: 60 capsule, Refills: 0      CONTINUE these medications which have NOT CHANGED   Details  aspirin EC 81 MG tablet Take 1 tablet (81 mg total) by mouth daily. Qty: 30 tablet, Refills: 0    carvedilol (COREG) 6.25 MG tablet Take 1 tablet (6.25 mg total) by mouth 2 (two) times daily with a meal. Qty: 60 tablet, Refills: 6    ferrous sulfate 325 (65 FE) MG tablet Take 1 tablet (325 mg total) by mouth daily with breakfast. Qty: 30 tablet, Refills: 1    magnesium oxide (MAG-OX) 400 MG tablet Take 400 mg by mouth daily.    Multiple Vitamin  (MULTIVITAMIN WITH MINERALS) TABS tablet Take 1 tablet by mouth daily.    pantoprazole (PROTONIX) 40 MG tablet Take 1 tablet (40 mg total) by mouth at bedtime. Qty: 30 tablet, Refills: 6    potassium chloride SA (K-DUR,KLOR-CON) 20 MEQ tablet Take 20 mEq by mouth 2 (two) times daily.    rosuvastatin (CRESTOR) 40 MG tablet Take 40 mg by mouth daily.    simethicone (GAS-X) 80 MG chewable tablet Chew 1 tablet (80 mg total) by mouth 4 (four) times daily. Qty: 120 tablet, Refills: 0    spironolactone (ALDACTONE) 25 MG tablet Take 1 tablet (25 mg total) by mouth daily. Qty: 30 tablet, Refills: 6    terbinafine (LAMISIL) 250 MG tablet Take 250 mg by mouth daily.    nitroGLYCERIN (NITROSTAT) 0.4 MG SL tablet Place 0.4 mg under the tongue every 5 (five) minutes as needed for chest pain.      STOP taking these medications     magnesium oxide (MAG-OX) 400 (241.3 MG) MG tablet      HYDROcodone-acetaminophen (NORCO/VICODIN) 5-325 MG per tablet      lactose free nutrition (BOOST) LIQD        Follow-up Information   Follow up with REESE,BETTI D, MD. Schedule an appointment as soon as possible for a visit in 2 weeks. (to discuss sleep study)    Specialty:  Family Medicine   Contact information:   [address] [place] [address] [phone]  Schedule an appointment as soon as possible for a visit with Truitt Merle, NP. (within 7-10 days)    Specialty:  Nurse Practitioner   Contact information:   [place] [place] [address] [phone]       TOTAL DISCHARGE TIME: 35 mins  [facility] Pager [phone]  10/03/2013, 12:27 PM  Disclaimer: This note was dictated with voice recognition software. Similar sounding words can inadvertently be transcribed and may not be corrected upon review.

## 2013-10-03 NOTE — Progress Notes (Signed)
DC IV, DC Tele, DC Home. Discharge instructions and home medications discussed with patient and patient's wife. Patient and patient's wife denied any questions or concerns at this time. Patient leaving unit via wheelchair and appears in no acute distress.

## 2013-10-15 ENCOUNTER — Ambulatory Visit (INDEPENDENT_AMBULATORY_CARE_PROVIDER_SITE_OTHER): Payer: Medicare Other | Admitting: Nurse Practitioner

## 2013-10-15 ENCOUNTER — Encounter: Payer: Self-pay | Admitting: Nurse Practitioner

## 2013-10-15 VITALS — BP 148/90 | HR 97 | Ht 70.5 in | Wt 348.0 lb

## 2013-10-15 DIAGNOSIS — I10 Essential (primary) hypertension: Secondary | ICD-10-CM

## 2013-10-15 DIAGNOSIS — I255 Ischemic cardiomyopathy: Secondary | ICD-10-CM

## 2013-10-15 DIAGNOSIS — I2589 Other forms of chronic ischemic heart disease: Secondary | ICD-10-CM

## 2013-10-15 DIAGNOSIS — R0600 Dyspnea, unspecified: Secondary | ICD-10-CM

## 2013-10-15 DIAGNOSIS — I2510 Atherosclerotic heart disease of native coronary artery without angina pectoris: Secondary | ICD-10-CM

## 2013-10-15 DIAGNOSIS — R0609 Other forms of dyspnea: Secondary | ICD-10-CM

## 2013-10-15 DIAGNOSIS — R0989 Other specified symptoms and signs involving the circulatory and respiratory systems: Secondary | ICD-10-CM

## 2013-10-15 DIAGNOSIS — I5041 Acute combined systolic (congestive) and diastolic (congestive) heart failure: Secondary | ICD-10-CM | POA: Diagnosis not present

## 2013-10-15 LAB — BASIC METABOLIC PANEL
BUN: 16 mg/dL (ref 6–23)
CO2: 27 mEq/L (ref 19–32)
Calcium: 9.5 mg/dL (ref 8.4–10.5)
Chloride: 104 mEq/L (ref 96–112)
Creatinine, Ser: 1.6 mg/dL — ABNORMAL HIGH (ref 0.4–1.5)
GFR: 56.45 mL/min — ABNORMAL LOW (ref >60.00)
Glucose, Bld: 97 mg/dL (ref 70–99)
Potassium: 4.1 mEq/L (ref 3.5–5.1)
Sodium: 138 mEq/L (ref 135–145)

## 2013-10-15 LAB — BRAIN NATRIURETIC PEPTIDE: Pro B Natriuretic peptide (BNP): 9 pg/mL (ref 0.0–100.0)

## 2013-10-15 MED ORDER — CARVEDILOL 6.25 MG PO TABS: 9.7500 mg | ORAL_TABLET | Freq: Two times a day (BID) | ORAL | Status: DC

## 2013-10-15 NOTE — Progress Notes (Signed)
[name] Date of Birth: 12-Mar-1950 Medical Record #[id number]  History of Present Illness: [name] is seen back today for a follow up/post op hospital visit.  Seen for Dr. Rayann Heman. He has an ischemic CM with severe diffuse 3VD and prior stents to the RCA, systolic and diastolic dysfunction, HTN, HLD and obesity. Has had a history of atrial tach that has been treated with beta blocker. Was hospitalized back in April of 2013 with a HF exacerbation - had stopped his medicines. Last EF of 40 - to 83% with diastolic dysfunction as well noted in January of 2015.   Seen by me back in October of 2014 - Was out of his medicines again. Had lost weight and was anemic - was sending to GI and ended up having colon cancer.   He was admitted (02/02/2013 - 03/01/2013) to [facility] with subacute history of progressive weakness, constipation and difficulty eating and drinking. He presented with acute renal failure (Creatinine 7.83) and severe anemia (Hbg of 7.3) as well as obstruction. Colonoscopy doing this admission with biopsy revealed adenocarcinoma and patient underwent exploratory lap with partial colectomy with colostomy on 12/23 by Dr. Hulen Skains. Recovery was complicated by wound dehissance, requiring a return to the OR 12/29 with abdominal wound closure with VAC placment. After the procedure, the patient developed atrial tachycardia, so a dilt drip was started (12/29-1/2). On 1/4, the patient had a cardiac arrest, requiring defibrillation, and was subsequently transferred to the ICU. QT was found to be > 700, with mild hypokalemia and hypomagnesemia implicated as the cause. Lidocaine drip was started. Electrolyte abnormality treated and 2D-echo revealed an EF of 40-45% with periapical akinesis and biatrial enlargement. He experienced recurrent torsades on 01/05 and shocked in NSR. By 1/6, QTc had decreased to 499, lidocaine drip was d/c'ed, and patient was transferred out of ICU to stepdown.  Then admitted to physical medicine and rehabilitation on 01/09 due to severe deconditioning. He was continued on protein supplement to promote wound healing. His abdominal wound was treated with VAC through 03/05/13. His heart rate was controlled without recurrent arrythmia and was maintained on potassium and magnesium supplements. He was discharged to home on 03/07/2013. He was last evaluated by Dr. Hulen Skains on 02/03 with review of his pathology demonstrating invasive adenocarcinoma of the transverse colon with penetration into the pericolonic fat. He had 0/18 lymph nodes negative for metastatic disease. He was pathologic stage pT4N0M0.   I last saw him in April of 2015. He was taking his medicines and was stable from our standpoint.  Saw Dr. Gwenlyn Found in early July for a foot ulcer - basically normal ABI's done.   He has been readmitted to the hospital earlier this month - just discharged 2 weeks ago. Presented with respiratory distress - treated with Bipap and quickly stabilized. Was diuresed about 6 liters as well. Started on low dose ACE. Sleep study recommended.   Comes in today. Here alone. He says he is doing better. His weight is stable on his scales at home. No chest pain. Breathing has improved. Admission probably triggered by too much salt - he had been eating out. Still wanting to talk with general surgery about reversing his colostomy. Does not know if he has a visit with his PCP - wife handles his visits.    Current Outpatient Prescriptions  Medication Sig Dispense Refill  . aspirin EC 81 MG tablet Take 1 tablet (81 mg total) by mouth daily.  30 tablet  0  .  carvedilol (COREG) 6.25 MG tablet Take 1 tablet (6.25 mg total) by mouth 2 (two) times daily with a meal.  60 tablet  6  . Efinaconazole (JUBLIA) 10 % SOLN Apply topically daily.      . ferrous sulfate 325 (65 FE) MG tablet Take 1 tablet (325 mg total) by mouth daily with breakfast.  30 tablet  1  . furosemide (LASIX) 40 MG tablet Take 1  tablet (40 mg total) by mouth daily.  30 tablet  2  . levalbuterol (XOPENEX HFA) 45 MCG/ACT inhaler Inhale 2 puffs into the lungs every 4 (four) hours as needed for wheezing.      . Magnesium 500 MG CAPS Take 500 mg by mouth daily.      . Multiple Vitamins-Minerals (MULTIVITAMIN WITH MINERALS) tablet Take 1 tablet by mouth daily.      . nitroGLYCERIN (NITROSTAT) 0.4 MG SL tablet Place 0.4 mg under the tongue every 5 (five) minutes as needed for chest pain.      . pantoprazole (PROTONIX) 40 MG tablet Take 1 tablet (40 mg total) by mouth at bedtime.  30 tablet  6  . potassium chloride SA (K-DUR,KLOR-CON) 20 MEQ tablet Take 20 mEq by mouth 2 (two) times daily.      . ramipril (ALTACE) 1.25 MG capsule Take 1 capsule (1.25 mg total) by mouth every 12 (twelve) hours.  60 capsule  0  . rosuvastatin (CRESTOR) 40 MG tablet Take 40 mg by mouth daily.      . simethicone (GAS-X) 80 MG chewable tablet Chew 1 tablet (80 mg total) by mouth 4 (four) times daily.  120 tablet  0  . spironolactone (ALDACTONE) 25 MG tablet Take 1 tablet (25 mg total) by mouth daily.  30 tablet  6   No current facility-administered medications for this visit.    No Known Allergies  Past Medical History  Diagnosis Date  . Hypertension   . Hypercholesteremia   . CHF (congestive heart failure)     has diastolic heart failure grade 1; EF is 45 to 50% per echo 05/2011  . Coronary artery disease   . COPD (chronic obstructive pulmonary disease)   . Asthma   . Obesity   . Noncompliance   . NSVT (nonsustained ventricular tachycardia)     beta blocker restarted  . Atrial tachycardia     managed on beta blocker therapy  . Colon cancer   . Peripheral arterial disease     small ulcer the head of right metatarsal plantar surface right foot    Past Surgical History  Procedure Laterality Date  . Coronary stent placement      x2  . Cardiac catheterization  11/12/2008    stent x 2 to RCA  . Colonoscopy N/A 02/08/2013     Procedure: COLONOSCOPY;  Surgeon: [name], MD;  Location: [place];  Service: Endoscopy;  Laterality: N/A;  . Esophagogastroduodenoscopy N/A 02/08/2013    Procedure: ESOPHAGOGASTRODUODENOSCOPY (EGD);  Surgeon: [name], MD;  [facility] ENDOSCOPY;  Service: Endoscopy;  Laterality: N/A;  . Laparotomy N/A 02/12/2013    Procedure: EXPLORATORY LAPAROTOMY PARTIAL COLECTOMY WITH COLOSTOMY;  Surgeon: [name], MD;  Location: [place];  Service: General;  Laterality: N/A;  . Laparotomy N/A 02/18/2013    Procedure: EXPLORATORY LAPAROTOMY/Closure of Wound;  Surgeon: [name], MD;  Location: [place];  Service: General;  Laterality: N/A;    History  Smoking status  . Former Smoker  . Types: Cigarettes  .  Quit date: 07/15/2008  Smokeless tobacco  . Never Used    History  Alcohol Use No    Comment: not since New Year's    Family History  Problem Relation Age of Onset  . Hypertension Father   . Heart disease Father   . Diabetes Father   . Hypertension Mother     Review of Systems: The review of systems is per the HPI.  All other systems were reviewed and are negative.  Physical Exam: BP 148/90  Pulse 97  Ht 5' 10.5" (1.791 m)  Wt 348 lb (157.852 kg)  BMI 49.21 kg/m2 Patient is very pleasant and in no acute distress. He is obese. His weight is up. Skin is warm and dry. Color is normal.  HEENT is unremarkable. Normocephalic/atraumatic. PERRL. Sclera are nonicteric. Neck is supple. No masses. No JVD. Lungs are clear. Cardiac exam shows a regular rate and rhythm. Abdomen is soft. Extremities are without edema. Gait and ROM are intact. No gross neurologic deficits noted.  Wt Readings from Last 3 Encounters:  10/15/13 348 lb (157.852 kg)  10/03/13 338 lb 13.6 oz (153.7 kg)  08/30/13 341 lb 12.8 oz (155.039 kg)    LABORATORY DATA/PROCEDURES:  Lab Results  Component Value Date   WBC 7.6 10/02/2013   HGB 14.6 10/02/2013   HCT 46.1 10/02/2013   PLT 141* 10/02/2013     GLUCOSE 118* 10/02/2013   CHOL 158 10/02/2013   TRIG 117 10/02/2013   HDL 67 10/02/2013   LDLCALC 68 10/02/2013   ALT 14 10/02/2013   AST 13 10/02/2013   NA 137 10/02/2013   K 4.2 10/02/2013   CL 98 10/02/2013   CREATININE 1.61* 10/02/2013   BUN 25* 10/02/2013   CO2 24 10/02/2013   TSH 0.70 12/06/2012   INR 1.00 09/30/2013   HGBA1C 6.0 11/26/2009    BNP (last 3 results)  Recent Labs  06/18/13 1106 09/30/13 0115 09/30/13 0736  PROBNP 8.0 77.6 397.2*   Echo Study Conclusions from January 2015  - Left ventricle: The cavity size was mildly dilated. Wall thickness was increased in a pattern of mild LVH. Systolic function was mildly to moderately reduced. The estimated ejection fraction was in the range of 40% to 45%. There was akinesis of the apical lateral, apical anterior, and apical septal segments. There was akinesis of the true apex. Prominent trabeculation at the apex but I do not think thrombus was present. Patient had Definity study recently to look at the apex and no thrombus was seen. Doppler parameters are consistent with abnormal left ventricular relaxation (grade 1 diastolic dysfunction). - Aortic valve: Trileaflet; moderately calcified leaflets. There was no stenosis. - Mitral valve: Moderately calcified annulus. Mildly calcified leaflets . Trivial regurgitation. - Left atrium: The atrium was moderately dilated. - Right ventricle: The cavity size was normal. Systolic function was normal. - Right atrium: The atrium was mildly dilated. - Pulmonary arteries: No complete TR doppler jet so unable to estimate PA systolic pressure. - Systemic veins: IVC was not visualized. - Pericardium, extracardiac: A trivial pericardial effusion was identified. Impressions:  - Mildly dilated LV with mild LV hypertrophy. EF 40-45% with peri-apical akinesis. I do not think that LV apical thrombus is present. Normal RV size and systolic function. Biatrial enlargement.    Assessment  / Plan: 1. Combined systolic and diastolic HF - recent exacerbation - most likely from salt - need to start trying to titrate his CHF meds and get him on max regimen. Coreg is  increased to 9.75 mg BID. EKG today shows sinus tach. Needs to really cut back the salt. Weights daily and use extra lasix if needed.   2. CAD with prior PCI - no chest pain. Beta blocker increased today for his sinus tach and his heart failure.   3. Colon cancer  4. HTN - BP up some - Coreg increased today.   5. CKD - on very low dose ACE - check lab today - hope to increase if possible  Check labs today. Coreg increased. See back in 3 weeks and try to keep titrating his medicines.   Patient is agreeable to this plan and will call if any problems develop in the interim.   Burtis Junes, RN, [place] [address] [place],   [address] [phone]

## 2013-10-15 NOTE — Patient Instructions (Addendum)
We need to recheck lab today  Stay on your current medicines but I am increasing your Coreg to 1 and 1/2 pills two times a day (9.75 mg) - I sent a new RX to the drug store as well  Weigh yourself each morning and record.  Take extra dose of diuretic (LASIX) for weight gain of 3 pounds in 24 hours.   Limit sodium intake. Goal is to have less than 2000 mg (2gm) of salt per day.  I will see you in 3 weeks  Call the [place] office at [phone] if you have any questions, problems or concerns.

## 2013-10-17 DIAGNOSIS — I2510 Atherosclerotic heart disease of native coronary artery without angina pectoris: Secondary | ICD-10-CM | POA: Diagnosis not present

## 2013-10-17 DIAGNOSIS — C189 Malignant neoplasm of colon, unspecified: Secondary | ICD-10-CM | POA: Diagnosis not present

## 2013-10-17 DIAGNOSIS — I509 Heart failure, unspecified: Secondary | ICD-10-CM | POA: Diagnosis not present

## 2013-11-01 ENCOUNTER — Encounter: Payer: Self-pay | Admitting: Nurse Practitioner

## 2013-11-01 ENCOUNTER — Ambulatory Visit (INDEPENDENT_AMBULATORY_CARE_PROVIDER_SITE_OTHER): Payer: Medicare Other | Admitting: Nurse Practitioner

## 2013-11-01 VITALS — BP 110/78 | HR 88 | Ht 70.5 in | Wt 346.8 lb

## 2013-11-01 DIAGNOSIS — I10 Essential (primary) hypertension: Secondary | ICD-10-CM

## 2013-11-01 DIAGNOSIS — I2510 Atherosclerotic heart disease of native coronary artery without angina pectoris: Secondary | ICD-10-CM

## 2013-11-01 DIAGNOSIS — I2589 Other forms of chronic ischemic heart disease: Secondary | ICD-10-CM | POA: Diagnosis not present

## 2013-11-01 DIAGNOSIS — I255 Ischemic cardiomyopathy: Secondary | ICD-10-CM

## 2013-11-01 NOTE — Patient Instructions (Addendum)
I think you are doing well  Stay on your current medicines  Keep restricting your salt.   See me in 3 months.  Call the [place] office at [phone] if you have any questions, problems or concerns.

## 2013-11-01 NOTE — Progress Notes (Signed)
Larry [name] Date of Birth: 1950/06/08 Medical Record #[id number]  History of Present Illness: [name] is seen back today for a follow up/post op hospital visit. Seen for Dr. Rayann Heman. He has an ischemic CM with severe diffuse 3VD and prior stents to the RCA, systolic and diastolic dysfunction, HTN, HLD and obesity. Has had a history of atrial tach that has been treated with beta blocker. Was hospitalized back in April of 2013 with a HF exacerbation - had stopped his medicines. Last EF of 40 - to 25% with diastolic dysfunction as well noted in January of 2015.   Seen by me back in October of 2014 - Was out of his medicines again. Had lost weight and was anemic - was sending to GI and ended up having colon cancer.   He was admitted (02/02/2013 - 03/01/2013) to [facility] with subacute history of progressive weakness, constipation and difficulty eating and drinking. He presented with acute renal failure (Creatinine 7.83) and severe anemia (Hbg of 7.3) as well as obstruction. Colonoscopy doing this admission with biopsy revealed adenocarcinoma and patient underwent exploratory lap with partial colectomy with colostomy on 12/23 by Dr. Hulen Skains. Recovery was complicated by wound dehissance, requiring a return to the OR 12/29 with abdominal wound closure with VAC placment. After the procedure, the patient developed atrial tachycardia, so a dilt drip was started (12/29-1/2). On 1/4, the patient had a cardiac arrest, requiring defibrillation, and was subsequently transferred to the ICU. QT was found to be > 700, with mild hypokalemia and hypomagnesemia implicated as the cause. Lidocaine drip was started. Electrolyte abnormality treated and 2D-echo revealed an EF of 40-45% with periapical akinesis and biatrial enlargement. He experienced recurrent torsades on 01/05 and shocked in NSR. By 1/6, QTc had decreased to 499, lidocaine drip was d/c'ed, and patient was transferred out of ICU to stepdown.  Then admitted to physical medicine and rehabilitation on 01/09 due to severe deconditioning. He was continued on protein supplement to promote wound healing. His abdominal wound was treated with VAC through 03/05/13. His heart rate was controlled without recurrent arrythmia and was maintained on potassium and magnesium supplements. He was discharged to home on 03/07/2013. He was last evaluated by Dr. Hulen Skains on 02/03 with review of his pathology demonstrating invasive adenocarcinoma of the transverse colon with penetration into the pericolonic fat. He had 0/18 lymph nodes negative for metastatic disease. He was pathologic stage pT4N0M0.   I last saw him in April of 2015. He was taking his medicines and was stable from our standpoint.   Saw Dr. Gwenlyn Found in early July for a foot ulcer - basically normal ABI's done.   He has been readmitted to the hospital in August.  Presented with respiratory distress - treated with Bipap and quickly stabilized. Was diuresed about 6 liters as well. Started on low dose ACE. Sleep study recommended.  I saw him 3 weeks ago - he was better.  Admission probably triggered by too much salt - he had been eating out. I increased his Coreg for his HR and BP.  Comes in today. Here alone. He is doing well. Feels good. Not short of breath. Weight down. No swelling. Not dizzy or lightheaded. Doing much better with salt restriction. Pretty happy with how he is feeling. He will be having a sleep study arranged. Apparently after that he will be referred back to General Surgery for colostomy reversal.   Current Outpatient Prescriptions  Medication Sig Dispense Refill  . aspirin EC  81 MG tablet Take 1 tablet (81 mg total) by mouth daily.  30 tablet  0  . carvedilol (COREG) 6.25 MG tablet Take 1.5 tablets (9.375 mg total) by mouth 2 (two) times daily with a meal.  90 tablet  6  . Efinaconazole (JUBLIA) 10 % SOLN Apply topically daily.      . ferrous sulfate 325 (65 FE) MG tablet Take 1 tablet  (325 mg total) by mouth daily with breakfast.  30 tablet  1  . furosemide (LASIX) 40 MG tablet Take 1 tablet (40 mg total) by mouth daily.  30 tablet  2  . levalbuterol (XOPENEX HFA) 45 MCG/ACT inhaler Inhale 2 puffs into the lungs every 4 (four) hours as needed for wheezing.      . Magnesium 500 MG CAPS Take 500 mg by mouth daily.      . Multiple Vitamins-Minerals (MULTIVITAMIN WITH MINERALS) tablet Take 1 tablet by mouth daily.      . nitroGLYCERIN (NITROSTAT) 0.4 MG SL tablet Place 0.4 mg under the tongue every 5 (five) minutes as needed for chest pain.      . pantoprazole (PROTONIX) 40 MG tablet Take 1 tablet (40 mg total) by mouth at bedtime.  30 tablet  6  . potassium chloride SA (K-DUR,KLOR-CON) 20 MEQ tablet Take 20 mEq by mouth 2 (two) times daily.      . ramipril (ALTACE) 1.25 MG capsule Take 1 capsule (1.25 mg total) by mouth every 12 (twelve) hours.  60 capsule  0  . rosuvastatin (CRESTOR) 40 MG tablet Take 40 mg by mouth daily.      . simethicone (GAS-X) 80 MG chewable tablet Chew 1 tablet (80 mg total) by mouth 4 (four) times daily.  120 tablet  0  . spironolactone (ALDACTONE) 25 MG tablet Take 1 tablet (25 mg total) by mouth daily.  30 tablet  6   No current facility-administered medications for this visit.    No Known Allergies  Past Medical History  Diagnosis Date  . Hypertension   . Hypercholesteremia   . CHF (congestive heart failure)     has diastolic heart failure grade 1; EF is 45 to 50% per echo 05/2011  . Coronary artery disease   . COPD (chronic obstructive pulmonary disease)   . Asthma   . Obesity   . Noncompliance   . NSVT (nonsustained ventricular tachycardia)     beta blocker restarted  . Atrial tachycardia     managed on beta blocker therapy  . Colon cancer   . Peripheral arterial disease     small ulcer the head of right metatarsal plantar surface right foot    Past Surgical History  Procedure Laterality Date  . Coronary stent placement      x2    . Cardiac catheterization  11/12/2008    stent x 2 to RCA  . Colonoscopy N/A 02/08/2013    Procedure: COLONOSCOPY;  Surgeon: [name], MD;  Location: [place];  Service: Endoscopy;  Laterality: N/A;  . Esophagogastroduodenoscopy N/A 02/08/2013    Procedure: ESOPHAGOGASTRODUODENOSCOPY (EGD);  Surgeon: [name], MD;  Location: [facility] ENDOSCOPY;  Service: Endoscopy;  Laterality: N/A;  . Laparotomy N/A 02/12/2013    Procedure: EXPLORATORY LAPAROTOMY PARTIAL COLECTOMY WITH COLOSTOMY;  Surgeon: [name], MD;  Location: [place];  Service: General;  Laterality: N/A;  . Laparotomy N/A 02/18/2013    Procedure: EXPLORATORY LAPAROTOMY/Closure of Wound;  Surgeon: [name], MD;  Location: [place];  Service: General;  Laterality: N/A;    History  Smoking status  . Former Smoker  . Types: Cigarettes  . Quit date: 07/15/2008  Smokeless tobacco  . Never Used    History  Alcohol Use No    Comment: not since New Year's    Family History  Problem Relation Age of Onset  . Hypertension Father   . Heart disease Father   . Diabetes Father   . Hypertension Mother     Review of Systems: The review of systems is per the HPI.  All other systems were reviewed and are negative.  Physical Exam: BP 110/78  Pulse 88  Ht 5' 10.5" (1.791 m)  Wt 346 lb 12.8 oz (157.307 kg)  BMI 49.04 kg/m2 Patient is very pleasant and in no acute distress. He remains obese but his weight is down. Skin is warm and dry. Color is normal.  HEENT is unremarkable. Normocephalic/atraumatic. PERRL. Sclera are nonicteric. Neck is supple. No masses. No JVD. Lungs are clear. Cardiac exam shows a regular rate and rhythm. Heart tones distant. Abdomen is soft. Extremities are without edema. Gait and ROM are intact. No gross neurologic deficits noted.  Wt Readings from Last 3 Encounters:  11/01/13 346 lb 12.8 oz (157.307 kg)  10/15/13 348 lb (157.852 kg)  10/03/13 338 lb 13.6 oz (153.7 kg)    LABORATORY  DATA/PROCEDURES:  Lab Results  Component Value Date   WBC 7.6 10/02/2013   HGB 14.6 10/02/2013   HCT 46.1 10/02/2013   PLT 141* 10/02/2013   GLUCOSE 97 10/15/2013   CHOL 158 10/02/2013   TRIG 117 10/02/2013   HDL 67 10/02/2013   LDLCALC 68 10/02/2013   ALT 14 10/02/2013   AST 13 10/02/2013   NA 138 10/15/2013   K 4.1 10/15/2013   CL 104 10/15/2013   CREATININE 1.6* 10/15/2013   BUN 16 10/15/2013   CO2 27 10/15/2013   TSH 0.70 12/06/2012   INR 1.00 09/30/2013   HGBA1C 6.0 11/26/2009    BNP (last 3 results)  Recent Labs  09/30/13 0115 09/30/13 0736 10/15/13 1117  PROBNP 77.6 397.2* 9.0   Echo Study Conclusions from January 2015  - Left ventricle: The cavity size was mildly dilated. Wall thickness was increased in a pattern of mild LVH. Systolic function was mildly to moderately reduced. The estimated ejection fraction was in the range of 40% to 45%. There was akinesis of the apical lateral, apical anterior, and apical septal segments. There was akinesis of the true apex. Prominent trabeculation at the apex but I do not think thrombus was present. Patient had Definity study recently to look at the apex and no thrombus was seen. Doppler parameters are consistent with abnormal left ventricular relaxation (grade 1 diastolic dysfunction). - Aortic valve: Trileaflet; moderately calcified leaflets. There was no stenosis. - Mitral valve: Moderately calcified annulus. Mildly calcified leaflets . Trivial regurgitation. - Left atrium: The atrium was moderately dilated. - Right ventricle: The cavity size was normal. Systolic function was normal. - Right atrium: The atrium was mildly dilated. - Pulmonary arteries: No complete TR doppler jet so unable to estimate PA systolic pressure. - Systemic veins: IVC was not visualized. - Pericardium, extracardiac: A trivial pericardial effusion was identified. Impressions:  - Mildly dilated LV with mild LV hypertrophy. EF 40-45% with peri-apical  akinesis. I do not think that LV apical thrombus is present. Normal RV size and systolic function. Biatrial enlargement.  Assessment / Plan:  1. Combined systolic and diastolic HF - recent exacerbation -  most likely from salt - we will keep him on his regular medicines for now. BP a little soft to titrate medicines further.   2. CAD with prior PCI - no chest pain.  3. Colon cancer   4. HTN - BP much improved - recheck by me is 90/80. I think (given his size) that we need to stay on our current regimen. No changes made. See back in 3 months  5. CKD - on very low dose ACE   I will see him back in 3 months. No changes made for now.   Patient is agreeable to this plan and will call if any problems develop in the interim.   Burtis Junes, RN, [place] [address] [place], [place]  [address] [phone]

## 2013-11-06 ENCOUNTER — Other Ambulatory Visit: Payer: Self-pay | Admitting: Nurse Practitioner

## 2013-11-20 ENCOUNTER — Telehealth: Payer: Self-pay | Admitting: Hematology

## 2013-11-20 NOTE — Telephone Encounter (Signed)
due to CP1 out moved 10/2 appt to 10/9. lmonvm for pt re change w/new d/t.

## 2013-11-22 ENCOUNTER — Ambulatory Visit: Payer: Medicare Other

## 2013-11-22 ENCOUNTER — Other Ambulatory Visit: Payer: Medicare Other

## 2013-11-29 ENCOUNTER — Ambulatory Visit (HOSPITAL_BASED_OUTPATIENT_CLINIC_OR_DEPARTMENT_OTHER): Payer: Federal, State, Local not specified - PPO | Admitting: Hematology

## 2013-11-29 ENCOUNTER — Telehealth: Payer: Self-pay | Admitting: Hematology

## 2013-11-29 ENCOUNTER — Other Ambulatory Visit (HOSPITAL_BASED_OUTPATIENT_CLINIC_OR_DEPARTMENT_OTHER): Payer: Federal, State, Local not specified - PPO

## 2013-11-29 ENCOUNTER — Encounter: Payer: Self-pay | Admitting: Hematology

## 2013-11-29 VITALS — BP 132/77 | HR 91 | Temp 98.2°F | Resp 20 | Ht 70.5 in | Wt 352.3 lb

## 2013-11-29 DIAGNOSIS — Z85038 Personal history of other malignant neoplasm of large intestine: Secondary | ICD-10-CM

## 2013-11-29 DIAGNOSIS — C189 Malignant neoplasm of colon, unspecified: Secondary | ICD-10-CM | POA: Diagnosis not present

## 2013-11-29 DIAGNOSIS — Z23 Encounter for immunization: Secondary | ICD-10-CM

## 2013-11-29 LAB — CBC WITH DIFFERENTIAL/PLATELET
BASO%: 1.5 % (ref 0.0–2.0)
Basophils Absolute: 0.1 10e3/uL (ref 0.0–0.1)
EOS%: 5.6 % (ref 0.0–7.0)
Eosinophils Absolute: 0.3 10e3/uL (ref 0.0–0.5)
HCT: 41.9 % (ref 38.4–49.9)
HGB: 13.5 g/dL (ref 13.0–17.1)
LYMPH%: 31 % (ref 14.0–49.0)
MCH: 28.6 pg (ref 27.2–33.4)
MCHC: 32.2 g/dL (ref 32.0–36.0)
MCV: 88.9 fL (ref 79.3–98.0)
MONO#: 0.7 10e3/uL (ref 0.1–0.9)
MONO%: 11.3 % (ref 0.0–14.0)
NEUT#: 3 10e3/uL (ref 1.5–6.5)
NEUT%: 50.6 % (ref 39.0–75.0)
Platelets: 122 10e3/uL — ABNORMAL LOW (ref 140–400)
RBC: 4.71 10e6/uL (ref 4.20–5.82)
RDW: 14.8 % — ABNORMAL HIGH (ref 11.0–14.6)
WBC: 5.9 10e3/uL (ref 4.0–10.3)
lymph#: 1.8 10e3/uL (ref 0.9–3.3)

## 2013-11-29 LAB — BASIC METABOLIC PANEL (CC13)
Anion Gap: 8 meq/L (ref 3–11)
BUN: 15.1 mg/dL (ref 7.0–26.0)
CO2: 27 meq/L (ref 22–29)
Calcium: 10.1 mg/dL (ref 8.4–10.4)
Chloride: 105 meq/L (ref 98–109)
Creatinine: 1.7 mg/dL — ABNORMAL HIGH (ref 0.7–1.3)
Glucose: 95 mg/dL (ref 70–140)
Potassium: 3.9 meq/L (ref 3.5–5.1)
Sodium: 140 meq/L (ref 136–145)

## 2013-11-29 LAB — CEA: CEA: 1.1 ng/mL (ref 0.0–5.0)

## 2013-11-29 MED ORDER — INFLUENZA VAC SPLIT QUAD 0.5 ML IM SUSY
0.5000 mL | PREFILLED_SYRINGE | Freq: Once | INTRAMUSCULAR | Status: AC
  Administered 2013-11-29: 0.5 mL via INTRAMUSCULAR
  Filled 2013-11-29: qty 0.5

## 2013-11-29 NOTE — Progress Notes (Signed)
[place] [facility]COL[facility] OFFICE PROGRESS NOTE Date of Visit: 11/30/2014  Kristine Garbe, MD [address] [place] [address]  DIAGNOSIS: Colon cancer - Plan: CBC with Differential, Comprehensive metabolic panel (Cmet) - [facility], CEA, Influenza vac split quadrivalent PF (FLUARIX) injection 0.5 mL  Chief Complaint  Patient presents with  . Follow-up    CURRENT TREATMENT: Active surveillance.  INTERVAL HISTORY:  Larry [name] 63 y.o. male who is presenting for further management and evaluation of his colon cancer. He has an extensive past medical history including hypertension, NSVT, COPD secondary longstanding tobacco abuse (Quit 2010) and recently Stage IIC Colon Cancer. He is here for follow up.  He was last seen by Dr Juliann Mule on 08/30/13.   He was admitted (02/02/2013 - 03/01/2013) to [facility] with subacute history of progressive weakness, constipation and difficulty eating and drinking. He presented with acute renal failure (Creatinine 7.83) and severe anemia (Hbg of 7.3) as well as obstruction. Colonoscopy doing this admission with biopsy revealed adenocarcinoma and patient underwent exploratory lap with partial colectomy with colostomy on 12/23 by Dr. Hulen Skains. Recovery was complicated by wound dehissance, requiring a return to the OR 12/29 with abdominal wound closure with VAC placment. After the procedure, the patient developed atrial tachycardia, so a dilt drip was started (12/29-1/2). On 1/4, the patient had a cardiac arrest, requiring defibrillation, and was subsequently transferred to the ICU. QT was found to be > 700, with mild hypokalemia and hypomagnesemia implicated as the cause. Lidocaine drip was started. Electrolyte abnormality treated and 2D-echo revealed an EF of 40-45% with periapical akinesis and biatrial enlargement. He experienced recurrent torsades on 01/05 and shocked in NSR. By 1/6, QTc had decreased to 499, lidocaine drip was d/c'ed, and  patient was transferred out of ICU to stepdown.   He was then admitted to physical medicine and rehabilitation on 01/09 due to severe deconditioning. He was continued on protein supplement to promote wound healing. His abdominal wound was treated with VAC through 03/05/13. His heart rate was controlled without recurrent arrythmia and was maintained on potassium and magnesium supplements. He was discharged to home on 03/07/2013. He was last evaluated by Dr. Hulen Skains on 02/03 with review of his pathology demonstrating invasive adenocarcinoma of the transverse colon with penetration into the pericolonic fat. He had 0/18 lymph nodes negative for metastatic disease. He was pathologic stage [id number]. He was continued on his wet-to-dry dressings and scheduled for one month follow-up and evaluation by medical oncology.   Today, he reports for follow up. He reports feeling well.  He has gained nearly 30 lbs over the past several months.weight is 352 lbs.  His abdominal wound is nearly completely healed.  He denies any hospitalizations or emergency room visits. He has questions about colostomy reversal. Once cardiology clears him that will be done by Dr Hulen Skains. He is getting sleep study on 12/06/2013. He has renal insufficiency and told him to cut back on [place] use.  MEDICAL HISTORY: Past Medical History  Diagnosis Date  . Hypertension   . Hypercholesteremia   . CHF (congestive heart failure)     has diastolic heart failure grade 1; EF is 45 to 50% per echo 05/2011  . Coronary artery disease   . COPD (chronic obstructive pulmonary disease)   . Asthma   . Obesity   . Noncompliance   . NSVT (nonsustained ventricular tachycardia)     beta blocker restarted  . Atrial tachycardia     managed  on beta blocker therapy  . Colon cancer   . Peripheral arterial disease     small ulcer the head of right metatarsal plantar surface right foot    INTERIM HISTORY: has HYPERLIPIDEMIA; OBESITY; HYPERTENSION; CAD;  CARDIOMYOPATHY, ISCHEMIC; ATRIAL TACHYCARDIA; ASTHMA; C O P D; IMPOTENCE OF ORGANIC ORIGIN; SLEEP APNEA, OBSTRUCTIVE; Edema; Tachycardia; Acute renal failure; Hypotension; Chronic blood loss anemia; Atrial fibrillation; Colon cancer; Acute on chronic diastolic heart failure; Torsades de pointes; Physical deconditioning; Postop check; Peripheral arterial disease; Acute respiratory distress; Respiratory distress; and Acute combined systolic and diastolic congestive heart failure on his problem list.    ALLERGIES:  has No Known Allergies.  MEDICATIONS: has a current medication list which includes the following prescription(s): aspirin ec, carvedilol, efinaconazole, ferrous sulfate, furosemide, levalbuterol, magnesium, multivitamin with minerals, nitroglycerin, pantoprazole, potassium chloride sa, ramipril, rosuvastatin, simethicone, and spironolactone, and the following Facility-Administered Medications: influenza vac split quadrivalent pf.  SURGICAL HISTORY:  Past Surgical History  Procedure Laterality Date  . Coronary stent placement      x2  . Cardiac catheterization  11/12/2008    stent x 2 to RCA  . Colonoscopy N/A 02/08/2013    Procedure: COLONOSCOPY;  Surgeon: [name], MD;  Location: [place];  Service: Endoscopy;  Laterality: N/A;  . Esophagogastroduodenoscopy N/A 02/08/2013    Procedure: ESOPHAGOGASTRODUODENOSCOPY (EGD);  Surgeon: [name], MD;  Location: [facility];  Service: Endoscopy;  Laterality: N/A;  . Laparotomy N/A 02/12/2013    Procedure: EXPLORATORY LAPAROTOMY PARTIAL COLECTOMY WITH COLOSTOMY;  Surgeon: [name], MD;  Location: [place];  Service: General;  Laterality: N/A;  . Laparotomy N/A 02/18/2013    Procedure: EXPLORATORY LAPAROTOMY/Closure of Wound;  Surgeon: [name], MD;  Location: [place];  Service: General;  Laterality: N/A;    REVIEW OF SYSTEMS:   Constitutional: Denies fevers, chills, +weight gain do not walk any more. Eyes: Denies  blurriness of vision Ears, nose, mouth, throat, and face: Denies mucositis or sore throat Respiratory: Denies cough, dyspnea or wheezes Cardiovascular: Denies palpitation, chest discomfort or lower extremity swelling Gastrointestinal:  Denies nausea, heartburn or change in bowel habits Skin: Denies abnormal skin rashes Lymphatics: Denies new lymphadenopathy or easy bruising Neurological:Denies numbness, tingling or new weaknesses Behavioral/Psych: Mood is stable, no new changes  All other systems were reviewed with the patient and are negative.  PHYSICAL EXAMINATION: ECOG PERFORMANCE STATUS: 1  Blood pressure 132/77, pulse 91, temperature 98.2 F (36.8 C), temperature source Oral, resp. rate 20, height 5' 10.5" (1.791 m), weight 352 lb 4.8 oz (159.802 kg), SpO2 98.00%.  GENERAL:alert, no distress and comfortable; Morbidly obese.  SKIN: skin color, texture, turgor are normal, no rashes or significant lesions  EYES: normal, Conjunctiva are pink and non-injected, sclera clear  OROPHARYNX:no exudate, no erythema and lips, buccal mucosa, and tongue normal  NECK: supple, thyroid normal size, non-tender, without nodularity  LYMPH: no palpable lymphadenopathy in the cervical, axillary or inguinal  LUNGS: clear to auscultation and percussion with normal breathing effort  HEART: Tachycardia and no murmurs and 2+ woody edema  ABDOMEN:abdomen soft, non-tender and normal bowel sounds; CDI midline abdominal wound. Colostomy fine Musculoskeletal:no cyanosis of digits and no clubbing  NEURO: alert & oriented x 3 with fluent speech, no focal motor/sensory deficits   Labs:  Lab Results  Component Value Date   WBC 5.9 11/29/2013   HGB 13.5 11/29/2013   HCT 41.9 11/29/2013   MCV 88.9 11/29/2013   PLT 122* 11/29/2013   NEUTROABS 3.0 11/29/2013  Chemistry      Component Value Date/Time   NA 140 11/29/2013 0937   NA 138 10/15/2013 1117   K 3.9 11/29/2013 0937   K 4.1 10/15/2013 1117   CL 104  10/15/2013 1117   CO2 27 11/29/2013 0937   CO2 27 10/15/2013 1117   BUN 15.1 11/29/2013 0937   BUN 16 10/15/2013 1117   CREATININE 1.7* 11/29/2013 0937   CREATININE 1.6* 10/15/2013 1117   CREATININE 1.31 03/26/2013 1225      Component Value Date/Time   CALCIUM 10.1 11/29/2013 0937   CALCIUM 9.5 10/15/2013 1117   ALKPHOS 91 10/02/2013 0524   ALKPHOS 94 08/30/2013 1316   AST 13 10/02/2013 0524   AST 13 08/30/2013 1316   ALT 14 10/02/2013 0524   ALT 16 08/30/2013 1316   BILITOT 0.9 10/02/2013 0524   BILITOT 1.24* 08/30/2013 1316     CEA pending from today.   The trend is as follows:       ASSESSMENT: Herby L Loveall 63 y.o. male with a history of Colon cancer - Plan: CBC with Differential, Comprehensive metabolic panel (Cmet) - [facility], CEA, Influenza vac split quadrivalent PF (FLUARIX) injection 0.5 mL   PLAN:  1. Colon Cancer.  --He is Stage IIC. Based on the size of his tumor and absence of nodal involvement (0/18 lymph nodes). High risk features include T4 disease and obstructive mass. Favorable risk features include lack of nodal involvement, greater than 10 lymph nodes examined, lack of venous invasion or perforation.  --On initial consultation, we recommended against adjuvant chemotherapy therapy due to his current functional status and the severity of his cardiac history given two significant cardiac events doing the recent hospitalizations, i.e., Torsades de points. We provided a detail overview of the principle os adjuvant chemotherapy and used Capecitabine as an example in the the Stage III setting based on Twelves C, et. Al, . NEJM 2005.   --We measured the marginal survival benefit in the setting of stage II disease, even with the high risk features as noted above, against the risk of further electrolyte abnormalities resulting from chemotherapy due to nausea or vomiting and diarrhea. The resulting magnitude of the magnesium deficit or dehydration could prompt further cardiac events  which can be life-threatening. Furthermore, no existent, consistent data predict benefit from adjuvant chemotherapy and the lack of clinical correlation between risk features and selection of chemotherapy in high risk stage II disease makes observation for this patient reasonable. The patient was in agreement. He understands the risk of recurrence is higher due to these high risk features.   --Today, his CEA is pending and it was 1.2 down from 1.3.  He denies any constitutional symptoms suggestive of recurrence. He has inquired about colostomy reversal.  We will refer him back to surgery for consideration. It will be done after he gets cardiac clearance.  --Renal insufficiency: drink more fluids, avoid nephrotoxins and soda use.   -- counseled on losing weight and potion control when eating.  2. Follow-up.  --We will follow-up in 3 months for a symptom check and labs including chemistries, CBC and CEA. Given his obstructive symptoms at presentation, he should have a colonoscopy per GI.  All questions were answered. The patient knows to call the clinic with any problems, questions or concerns. We can certainly see the patient much sooner if necessary.  I spent 25 minutes counseling the patient face to face. The total time spent in the appointment was 30 minutes.  Bernadene Bell, MD Medical Hematologist/Oncologist [place] Pager: [phone] Office No: [phone]

## 2013-11-29 NOTE — Patient Instructions (Signed)
Influenza Vaccine (Flu Vaccine, Inactivated or Recombinant) 2014-2015: What You Need to Know 1. Why get vaccinated? Influenza ("flu") is a contagious disease that spreads around the [place] every winter, usually between October and May. Flu is caused by influenza viruses, and is spread mainly by coughing, sneezing, and close contact. Anyone can get flu, but the risk of getting flu is highest among children. Symptoms come on suddenly and may last several days. They can include:  fever/chills  sore throat  muscle aches  fatigue  cough  headache  runny or stuffy nose Flu can make some people much sicker than others. These people include young children, people 65 and older, pregnant women, and people with certain health conditions-such as heart, lung or kidney disease, nervous system disorders, or a weakened immune system. Flu vaccination is especially important for these people, and anyone in close contact with them. Flu can also lead to pneumonia, and make existing medical conditions worse. It can cause diarrhea and seizures in children. Each year thousands of people in the [place] die from flu, and many more are hospitalized. Flu vaccine is the best protection against flu and its complications. Flu vaccine also helps prevent spreading flu from person to person. 2. Inactivated and recombinant flu vaccines You are getting an injectable flu vaccine, which is either an "inactivated" or "recombinant" vaccine. These vaccines do not contain any live influenza virus. They are given by injection with a needle, and often called the "flu shot."  A different live, attenuated (weakened) influenza vaccine is sprayed into the nostrils. This vaccine is described in a separate Vaccine Information Statement. Flu vaccination is recommended every year. Some children 6 months through 8 years of age might need two doses during one year. Flu viruses are always changing. Each year's flu vaccine is made  to protect against 3 or 4 viruses that are likely to cause disease that year. Flu vaccine cannot prevent all cases of flu, but it is the best defense against the disease.  It takes about 2 weeks for protection to develop after the vaccination, and protection lasts several months to a year. Some illnesses that are not caused by influenza virus are often mistaken for flu. Flu vaccine will not prevent these illnesses. It can only prevent influenza. Some inactivated flu vaccine contains a very small amount of a mercury-based preservative called thimerosal. Studies have shown that thimerosal in vaccines is not harmful, but flu vaccines that do not contain a preservative are available. 3. Some people should not get this vaccine Tell the person who gives you the vaccine:  If you have any severe, life-threatening allergies. If you ever had a life-threatening allergic reaction after a dose of flu vaccine, or have a severe allergy to any part of this vaccine, including (for example) an allergy to gelatin, antibiotics, or eggs, you may be advised not to get vaccinated. Most, but not all, types of flu vaccine contain a small amount of egg protein.  If you ever had Guillain-Barr Syndrome (a severe paralyzing illness, also called GBS). Some people with a history of GBS should not get this vaccine. This should be discussed with your doctor.  If you are not feeling well. It is usually okay to get flu vaccine when you have a mild illness, but you might be advised to wait until you feel better. You should come back when you are better. 4. Risks of a vaccine reaction With a vaccine, like any medicine, there is a chance of side   effects. These are usually mild and go away on their own. Problems that could happen after any vaccine:  Brief fainting spells can happen after any medical procedure, including vaccination. Sitting or lying down for about 15 minutes can help prevent fainting, and injuries caused by a fall. Tell  your doctor if you feel dizzy, or have vision changes or ringing in the ears.  Severe shoulder pain and reduced range of motion in the arm where a shot was given can happen, very rarely, after a vaccination.  Severe allergic reactions from a vaccine are very rare, estimated at less than 1 in a million doses. If one were to occur, it would usually be within a few minutes to a few hours after the vaccination. Mild problems following inactivated flu vaccine:  soreness, redness, or swelling where the shot was given  hoarseness  sore, red or itchy eyes  cough  fever  aches  headache  itching  fatigue If these problems occur, they usually begin soon after the shot and last 1 or 2 days. Moderate problems following inactivated flu vaccine:  Young children who get inactivated flu vaccine and pneumococcal vaccine (PCV13) at the same time may be at increased risk for seizures caused by fever. Ask your doctor for more information. Tell your doctor if a child who is getting flu vaccine has ever had a seizure. Inactivated flu vaccine does not contain live flu virus, so you cannot get the flu from this vaccine. As with any medicine, there is a very remote chance of a vaccine causing a serious injury or death. The safety of vaccines is always being monitored. For more information, visit: www.cdc.gov/vaccinesafety/ 5. What if there is a serious reaction? What should I look for?  Look for anything that concerns you, such as signs of a severe allergic reaction, very high fever, or behavior changes. Signs of a severe allergic reaction can include hives, swelling of the face and throat, difficulty breathing, a fast heartbeat, dizziness, and weakness. These would start a few minutes to a few hours after the vaccination. What should I do?  If you think it is a severe allergic reaction or other emergency that can't wait, call 9-1-1 and get the person to the nearest hospital. Otherwise, call your  doctor.  Afterward, the reaction should be reported to the Vaccine Adverse Event Reporting System (VAERS). Your doctor should file this report, or you can do it yourself through the VAERS web site at www.vaers.hhs.gov, or by calling 1-800-822-7967. VAERS does not give medical advice. 6. The National Vaccine Injury Compensation Program The National Vaccine Injury Compensation Program (VICP) is a federal program that was created to compensate people who may have been injured by certain vaccines. Persons who believe they may have been injured by a vaccine can learn about the program and about filing a claim by calling 1-800-338-2382 or visiting the VICP website at www.hrsa.gov/vaccinecompensation. There is a time limit to file a claim for compensation. 7. How can I learn more?  Ask your health care provider.  Call your local or state health department.  Contact the Centers for Disease Control and Prevention (CDC):  Call 1-800-232-4636 (1-800-CDC-INFO) or  Visit CDC's website at www.cdc.gov/flu CDC Vaccine Information Statement (Interim) Inactivated Influenza Vaccine (10/09/2012) Document Released: 12/02/2005 Document Revised: 06/24/2013 Document Reviewed: 01/25/2013 ExitCare Patient Information 2015 [employer]. This information is not intended to replace advice given to you by your health care provider. Make sure you discuss any questions you have with your health   care provider.  

## 2013-11-29 NOTE — Telephone Encounter (Signed)
gv pt appt schedule for jan 2016 °

## 2013-12-04 ENCOUNTER — Ambulatory Visit (HOSPITAL_BASED_OUTPATIENT_CLINIC_OR_DEPARTMENT_OTHER): Payer: Medicare Other | Attending: Family Medicine | Admitting: Radiology

## 2013-12-04 ENCOUNTER — Other Ambulatory Visit: Payer: Self-pay | Admitting: Internal Medicine

## 2013-12-04 VITALS — Ht 70.5 in | Wt 348.0 lb

## 2013-12-04 DIAGNOSIS — G4733 Obstructive sleep apnea (adult) (pediatric): Secondary | ICD-10-CM | POA: Diagnosis not present

## 2013-12-04 DIAGNOSIS — Z6841 Body Mass Index (BMI) 40.0 and over, adult: Secondary | ICD-10-CM | POA: Insufficient documentation

## 2013-12-07 ENCOUNTER — Other Ambulatory Visit: Payer: Self-pay | Admitting: Nurse Practitioner

## 2013-12-07 DIAGNOSIS — G4733 Obstructive sleep apnea (adult) (pediatric): Secondary | ICD-10-CM

## 2013-12-07 NOTE — Sleep Study (Signed)
   NAME: Larry [name] DATE OF BIRTH:  Mar 13, 1950 MEDICAL RECORD NUMBER [id number]  LOCATION: [facility]  PHYSICIAN: Austin Herd D  DATE OF STUDY: 12/04/2013  SLEEP STUDY TYPE: Nocturnal Polysomnogram               REFERRING PHYSICIAN: Lin Landsman D, MD  INDICATION FOR STUDY: Hypersomnia with sleep apnea  EPWORTH SLEEPINESS SCORE:   8/24 HEIGHT: 5' 10.5" (179.1 cm)  WEIGHT: 348 lb (157.852 kg)    Body mass index is 49.21 kg/(m^2).  NECK SIZE: 18.5 in.  MEDICATIONS: Charted for review  SLEEP ARCHITECTURE: Split study protocol. During the diagnostic phase, total sleep time 122 minutes. Sleep efficiency 94.2%. Stage I was 3.3%, stage II 84.8%, stage III absent, REM 11.9% of total sleep time. Sleep latency 6 minutes, REM latency 62 minutes, awake after sleep onset 1.5 minutes, arousal index 63, bedtime medication: None  RESPIRATORY DATA: Apnea hypopneas index (AHI) 24.6 per hour. 50 total events scored including 38 obstructive apneas and 12 hypopneas. All events were associated with supine sleep position. REM AHI 49.7 per hour. CPAP titration to 14 CWP, AHI 5.0 per hour. He wore a large fullface mask.  OXYGEN DATA: Moderate snoring with oxygen desaturation to a nadir of 82% on room air. With CPAP control, snoring was prevented and mean oxygen saturation was 91.5% on room air.  CARDIAC DATA: Sinus rhythm with PVCs  MOVEMENT/PARASOMNIA: Periodic limb movement. During the diagnostic phase 50 total limb jerks of which 33 were associated with arousal or wakening for a periodic limb movement with arousal index of 16.2 per hour. During the titration phase, 32 limb jerks of which 4 were associated with arousal or wakening for an index of 4.8 per hour. Bathroom x2  IMPRESSION/ RECOMMENDATION:   1) Moderate obstructive sleep apnea/hypoxia drum, AHI 24.6 per hour with supine events. Moderate snoring with oxygen desaturation to a nadir of 82% on room air. 2) Successful  CPAP titration to 14 CWP, AHI 5.0 per hour. He wore a large ResMed AirFit fullface mask with heated humidifier. Snoring was prevented and mean oxygen saturation was 91.5% on room air. 3) Limb jerks were noted with some sleep disturbance as described above. Most of these seem related to respiratory arousals and should clear with successful sleep apnea treatment.  Deneise Lever Diplomate, American Board of Sleep Medicine  ELECTRONICALLY SIGNED ON:  12/07/2013, 12:03 PM [place] PH: [phone]   FX: [phone] [place]

## 2013-12-07 NOTE — Telephone Encounter (Signed)
Rx was sent to pharmacy electronically. 

## 2013-12-15 ENCOUNTER — Other Ambulatory Visit: Payer: Self-pay | Admitting: Nurse Practitioner

## 2014-01-12 ENCOUNTER — Other Ambulatory Visit: Payer: Self-pay | Admitting: Physical Medicine and Rehabilitation

## 2014-01-13 ENCOUNTER — Other Ambulatory Visit: Payer: Self-pay | Admitting: Nurse Practitioner

## 2014-01-15 ENCOUNTER — Other Ambulatory Visit: Payer: Self-pay | Admitting: *Deleted

## 2014-01-15 MED ORDER — FERROUS SULFATE 325 (65 FE) MG PO TABS: 325.0000 mg | ORAL_TABLET | Freq: Every day | ORAL | Status: DC

## 2014-01-15 NOTE — Telephone Encounter (Signed)
I refilled Iron per Truitt Merle, NP, pt has appointment in December and will get lab work

## 2014-01-22 ENCOUNTER — Other Ambulatory Visit: Payer: Self-pay | Admitting: Internal Medicine

## 2014-01-28 ENCOUNTER — Other Ambulatory Visit: Payer: Self-pay | Admitting: Nurse Practitioner

## 2014-01-31 ENCOUNTER — Encounter: Payer: Self-pay | Admitting: Nurse Practitioner

## 2014-01-31 ENCOUNTER — Ambulatory Visit (INDEPENDENT_AMBULATORY_CARE_PROVIDER_SITE_OTHER): Payer: Medicare Other | Admitting: Nurse Practitioner

## 2014-01-31 VITALS — BP 122/90 | HR 92 | Ht 70.5 in | Wt 358.8 lb

## 2014-01-31 DIAGNOSIS — I5022 Chronic systolic (congestive) heart failure: Secondary | ICD-10-CM | POA: Diagnosis not present

## 2014-01-31 DIAGNOSIS — I255 Ischemic cardiomyopathy: Secondary | ICD-10-CM | POA: Diagnosis not present

## 2014-01-31 DIAGNOSIS — I5040 Unspecified combined systolic (congestive) and diastolic (congestive) heart failure: Secondary | ICD-10-CM

## 2014-01-31 DIAGNOSIS — R0602 Shortness of breath: Secondary | ICD-10-CM

## 2014-01-31 DIAGNOSIS — I5041 Acute combined systolic (congestive) and diastolic (congestive) heart failure: Secondary | ICD-10-CM | POA: Diagnosis not present

## 2014-01-31 DIAGNOSIS — G4733 Obstructive sleep apnea (adult) (pediatric): Secondary | ICD-10-CM | POA: Diagnosis not present

## 2014-01-31 DIAGNOSIS — E785 Hyperlipidemia, unspecified: Secondary | ICD-10-CM

## 2014-01-31 DIAGNOSIS — I10 Essential (primary) hypertension: Secondary | ICD-10-CM

## 2014-01-31 LAB — BASIC METABOLIC PANEL
BUN: 19 mg/dL (ref 6–23)
CO2: 25 mEq/L (ref 19–32)
Calcium: 9.9 mg/dL (ref 8.4–10.5)
Chloride: 103 mEq/L (ref 96–112)
Creatinine, Ser: 1.6 mg/dL — ABNORMAL HIGH (ref 0.4–1.5)
GFR: 55.6 mL/min — ABNORMAL LOW (ref >60.00)
Glucose, Bld: 97 mg/dL (ref 70–99)
Potassium: 3.8 mEq/L (ref 3.5–5.1)
Sodium: 135 mEq/L (ref 135–145)

## 2014-01-31 LAB — BRAIN NATRIURETIC PEPTIDE: Pro B Natriuretic peptide (BNP): 7 pg/mL (ref 0.0–100.0)

## 2014-01-31 MED ORDER — ATORVASTATIN CALCIUM 40 MG PO TABS: 40.0000 mg | ORAL_TABLET | Freq: Every day | ORAL | Status: DC

## 2014-01-31 MED ORDER — LEVALBUTEROL TARTRATE 45 MCG/ACT IN AERO: 2.0000 | INHALATION_SPRAY | RESPIRATORY_TRACT | Status: DC | PRN

## 2014-01-31 NOTE — Progress Notes (Signed)
Larry [name] Date of Birth: 1950-08-27 Medical Record #[id number]  History of Present Illness: Larry [name] is seen back today for a follow up visit. This is a 3 month check. Seen for Dr. Rayann Heman. He has an ischemic CM with severe diffuse 3VD and prior stents to the RCA, systolic and diastolic dysfunction, HTN, HLD and obesity. Has had a history of atrial tach that has been treated with beta blocker. Was hospitalized back in April of 2013 with a HF exacerbation - had stopped his medicines. Last EF of 40 - to 53% with diastolic dysfunction as well noted in January of 2015.   Seen by me back in October of 2014 - Was out of his medicines again. Had lost weight and was anemic - was sending to GI and ended up having colon cancer.   He was admitted (02/02/2013 - 03/01/2013) to [facility] with subacute history of progressive weakness, constipation and difficulty eating and drinking. He presented with acute renal failure (Creatinine 7.83) and severe anemia (Hbg of 7.3) as well as obstruction. Colonoscopy doing this admission with biopsy revealed adenocarcinoma and patient underwent exploratory lap with partial colectomy with colostomy on 12/23 by Dr. Hulen Skains. Recovery was complicated by wound dehissance, requiring a return to the OR 12/29 with abdominal wound closure with VAC placment. After the procedure, the patient developed atrial tachycardia, so a dilt drip was started (12/29-1/2). On 1/4, the patient had a cardiac arrest, requiring defibrillation, and was subsequently transferred to the ICU. QT was found to be > 700, with mild hypokalemia and hypomagnesemia implicated as the cause. Lidocaine drip was started. Electrolyte abnormality treated and 2D-echo revealed an EF of 40-45% with periapical akinesis and biatrial enlargement. He experienced recurrent torsades on 01/05 and shocked in NSR. By 1/6, QTc had decreased to 499, lidocaine drip was d/c'ed, and patient was transferred out of ICU to  stepdown. Then admitted to physical medicine and rehabilitation on 01/09 due to severe deconditioning. He was continued on protein supplement to promote wound healing. His abdominal wound was treated with VAC through 03/05/13. His heart rate was controlled without recurrent arrythmia and was maintained on potassium and magnesium supplements. He was discharged to home on 03/07/2013. He was last evaluated by Dr. Hulen Skains on 02/03 with review of his pathology demonstrating invasive adenocarcinoma of the transverse colon with penetration into the pericolonic fat. He had 0/18 lymph nodes negative for metastatic disease. He was pathologic stage [id number].   I last saw him in April of 2015. He was taking his medicines and was stable from our standpoint.   Saw Dr. Gwenlyn Found in early July of 2015 for a foot ulcer - basically normal ABI's done.   He has been readmitted to the hospital in August - triggered by too much salt.  Presented with respiratory distress - treated with Bipap and quickly stabilized. Was diuresed about 6 liters as well. Started on low dose ACE. Sleep study recommended.  I have seen him twice since that hospitalization - last in September - he had improved. Titrated as much medicine as BP could tolerate.  Was to be having a sleep study arranged. Apparently after that he will be referred back to General Surgery for colostomy reversal.   Comes in today. Here alone. He feels like he is doing well. He is short of breath. No more swelling than his usual. His weight is up however.  Uses his inhaler and feels better - needs refilled. Had his sleep study - has not  heard the results. No palpitations. Not getting much activity. No chest pain. Tolerating his medicines. Not dizzy. He feels like he is restricting his salt.   Current Outpatient Prescriptions  Medication Sig Dispense Refill  . aspirin EC 81 MG tablet Take 1 tablet (81 mg total) by mouth daily. 30 tablet 0  . carvedilol (COREG) 6.25 MG tablet Take  1.5 tablets (9.375 mg total) by mouth 2 (two) times daily with a meal. 90 tablet 6  . CRESTOR 40 MG tablet TAKE 1 TABLET DAILY 30 tablet 11  . ferrous sulfate 325 (65 FE) MG tablet Take 1 tablet (325 mg total) by mouth daily with breakfast. 30 tablet 1  . furosemide (LASIX) 40 MG tablet TAKE 1 TABLET BY MOUTH EVERY DAY 30 tablet 0  . HYDROcodone-acetaminophen (NORCO/VICODIN) 5-325 MG per tablet Take 1 tablet by mouth every 6 (six) hours as needed for moderate pain.    [name] Kitchen KLOR-CON M20 20 MEQ tablet TAKE 1 TABLET TWICE DAILY 60 tablet 3  . levalbuterol (XOPENEX HFA) 45 MCG/ACT inhaler Inhale 2 puffs into the lungs every 4 (four) hours as needed for wheezing. 1 Inhaler 11  . Magnesium 500 MG CAPS Take 500 mg by mouth daily.    . Multiple Vitamins-Minerals (MULTIVITAMIN WITH MINERALS) tablet Take 1 tablet by mouth daily.    . nitroGLYCERIN (NITROSTAT) 0.4 MG SL tablet Place 0.4 mg under the tongue every 5 (five) minutes as needed for chest pain.    . pantoprazole (PROTONIX) 40 MG tablet TAKE 1 TABLET (40 MG TOTAL) BY MOUTH AT BEDTIME. 30 tablet 0  . ramipril (ALTACE) 1.25 MG capsule TAKE ONE CAPSULE BY MOUTH EVERY 12 HOURS 60 capsule 3  . simethicone (GAS-X) 80 MG chewable tablet Chew 1 tablet (80 mg total) by mouth 4 (four) times daily. 120 tablet 0  . spironolactone (ALDACTONE) 25 MG tablet TAKE 1 TABLET (25 MG TOTAL) BY MOUTH DAILY. 30 tablet 0   No current facility-administered medications for this visit.    No Known Allergies  Past Medical History  Diagnosis Date  . Hypertension   . Hypercholesteremia   . CHF (congestive heart failure)     has diastolic heart failure grade 1; EF is 45 to 50% per echo 05/2011  . Coronary artery disease   . COPD (chronic obstructive pulmonary disease)   . Asthma   . Obesity   . Noncompliance   . NSVT (nonsustained ventricular tachycardia)     beta blocker restarted  . Atrial tachycardia     managed on beta blocker therapy  . Colon cancer   .  Peripheral arterial disease     small ulcer the head of right metatarsal plantar surface right foot    Past Surgical History  Procedure Laterality Date  . Coronary stent placement      x2  . Cardiac catheterization  11/12/2008    stent x 2 to RCA  . Colonoscopy N/A 02/08/2013    Procedure: COLONOSCOPY;  Surgeon: [name], MD;  Location: [place];  Service: Endoscopy;  Laterality: N/A;  . Esophagogastroduodenoscopy N/A 02/08/2013    Procedure: ESOPHAGOGASTRODUODENOSCOPY (EGD);  Surgeon: [name], MD;  Location[facility] ENDOSCOPY;  Service: Endoscopy;  Laterality: N/A;  . Laparotomy N/A 02/12/2013    Procedure: EXPLORATORY LAPAROTOMY PARTIAL COLECTOMY WITH COLOSTOMY;  Surgeon: [name], MD;  Location: [place];  Service: General;  Laterality: N/A;  . Laparotomy N/A 02/18/2013    Procedure: EXPLORATORY LAPAROTOMY/Closure of Wound;  Surgeon: [name],  MD;  Location: [place] OR;  Service: General;  Laterality: N/A;    History  Smoking status  . Former Smoker  . Types: Cigarettes  . Quit date: 07/15/2008  Smokeless tobacco  . Never Used    History  Alcohol Use No    Comment: not since New Year's    Family History  Problem Relation Age of Onset  . Hypertension Father   . Heart disease Father   . Diabetes Father   . Hypertension Mother     Review of Systems: The review of systems is per the HPI.  All other systems were reviewed and are negative.  Physical Exam: BP 122/90 mmHg  Pulse 92  Ht 5' 10.5" (1.791 m)  Wt 358 lb 12.8 oz (162.751 kg)  BMI 50.74 kg/m2 Patient is very pleasant and in no acute distress.he is morbidly obese.  His weight is up 10 pounds. Skin is warm and dry. Color is normal.  HEENT is unremarkable. Normocephalic/atraumatic. PERRL. Sclera are nonicteric. Neck is supple. No masses. No JVD. Lungs are clear. Cardiac exam shows a regular rate and rhythm. Short run of tachy noted on exam - but brief. Abdomen is soft. Extremities are quite thick  with 1+bilateral edema. Gait and ROM are intact. No gross neurologic deficits noted.  Wt Readings from Last 3 Encounters:  01/31/14 358 lb 12.8 oz (162.751 kg)  12/04/13 348 lb (157.852 kg)  11/29/13 352 lb 4.8 oz (159.802 kg)    LABORATORY DATA/PROCEDURES: BMET and BNP pending  EKG today with NSR with PACs  Lab Results  Component Value Date   WBC 5.9 11/29/2013   HGB 13.5 11/29/2013   HCT 41.9 11/29/2013   PLT 122* 11/29/2013   GLUCOSE 95 11/29/2013   CHOL 158 10/02/2013   TRIG 117 10/02/2013   HDL 67 10/02/2013   LDLCALC 68 10/02/2013   ALT 14 10/02/2013   AST 13 10/02/2013   NA 140 11/29/2013   K 3.9 11/29/2013   CL 104 10/15/2013   CREATININE 1.7* 11/29/2013   BUN 15.1 11/29/2013   CO2 27 11/29/2013   TSH 0.70 12/06/2012   INR 1.00 09/30/2013   HGBA1C 6.0 11/26/2009    BNP (last 3 results)  Recent Labs  09/30/13 0115 09/30/13 0736 10/15/13 1117  PROBNP 77.6 397.2* 9.0   Echo Study Conclusions from January 2015  - Left ventricle: The cavity size was mildly dilated. Wall thickness was increased in a pattern of mild LVH. Systolic function was mildly to moderately reduced. The estimated ejection fraction was in the range of 40% to 45%. There was akinesis of the apical lateral, apical anterior, and apical septal segments. There was akinesis of the true apex. Prominent trabeculation at the apex but I do not think thrombus was present. Patient had Definity study recently to look at the apex and no thrombus was seen. Doppler parameters are consistent with abnormal left ventricular relaxation (grade 1 diastolic dysfunction). - Aortic valve: Trileaflet; moderately calcified leaflets. There was no stenosis. - Mitral valve: Moderately calcified annulus. Mildly calcified leaflets . Trivial regurgitation. - Left atrium: The atrium was moderately dilated. - Right ventricle: The cavity size was normal. Systolic function was normal. - Right atrium: The atrium was  mildly dilated. - Pulmonary arteries: No complete TR doppler jet so unable to estimate PA systolic pressure. - Systemic veins: IVC was not visualized. - Pericardium, extracardiac: A trivial pericardial effusion was identified. Impressions:  - Mildly dilated LV with mild LV hypertrophy. EF 40-45% with peri-apical  akinesis. I do not think that LV apical thrombus is present. Normal RV size and systolic function. Biatrial enlargement.   Assessment / Plan:  1. Combined systolic and diastolic HF - has more dyspnea and weight is up - will check labs today. May need to adjust diuretics but this may be more from deconditioning and obesity.   2. CAD with prior PCI - no chest pain.  3. Colon cancer - followed by oncology  4. HTN - stable  5. CKD - on very low dose ACE   6. OSA - moderate per the sleep study - he was not placed on CPAP - will refer to Dr. Annamaria Boots.  7. HLD - His insurance will no longer cover Crestor after Jan. 1 - will change to Lipitor 40mg  . Check labs on return OV with me.   Patient remains hopeful to have his colostomy reversed. I will see back in 3 months. Encouraged him to keep restricting his salt.   Patient is agreeable to this plan and will call if any problems develop in the interim.   Burtis Junes, RN, [place] [address] [place], [place]  [address] [phone]

## 2014-01-31 NOTE — Patient Instructions (Signed)
We will be checking the following labs today BMET & BNP  Finish your Crestor - then we will switch to Lipitor 40 mg a day  I refilled your inhaler  See me in 3 months with fasting labs  We will refer you to the sleep doctor - Dr. Annamaria Boots - for CPAP  Keep away from the salt  Try to get a little more activity  Call the [place] office at [phone] if you have any questions, problems or concerns.

## 2014-02-18 ENCOUNTER — Other Ambulatory Visit: Payer: Self-pay | Admitting: Internal Medicine

## 2014-02-18 ENCOUNTER — Telehealth: Payer: Self-pay | Admitting: Oncology

## 2014-02-18 NOTE — Telephone Encounter (Signed)
, °

## 2014-02-23 ENCOUNTER — Other Ambulatory Visit: Payer: Self-pay | Admitting: Internal Medicine

## 2014-03-11 ENCOUNTER — Other Ambulatory Visit: Payer: Medicare Other

## 2014-03-11 ENCOUNTER — Ambulatory Visit: Payer: Medicare Other

## 2014-03-18 ENCOUNTER — Encounter: Payer: Self-pay | Admitting: Internal Medicine

## 2014-03-18 ENCOUNTER — Ambulatory Visit (INDEPENDENT_AMBULATORY_CARE_PROVIDER_SITE_OTHER): Payer: Medicare Other | Admitting: Internal Medicine

## 2014-03-18 VITALS — BP 118/82 | HR 90 | Ht 70.5 in | Wt 363.6 lb

## 2014-03-18 DIAGNOSIS — G4733 Obstructive sleep apnea (adult) (pediatric): Secondary | ICD-10-CM | POA: Diagnosis not present

## 2014-03-18 NOTE — Patient Instructions (Signed)
Order- new DME new CPAP auto 10-20 x 7 days for pressure recommendation    Mask of choice, humidifier, supplies   Dx OSA  Booklet on sleep apnea  Ok to go ahead with plans for your surgery. You will want to take your CPAP mask with you to the hospital when you go- put a name tag on it so it doesn't get lost.

## 2014-03-18 NOTE — Assessment & Plan Note (Signed)
Preliminary discussion6 and a booklet on obstructive sleep apnea were provided. Treatment options, medical consequences of untreated sleep apnea, importance of weight control and driving responsibility. Plan-start CPAP. To give him some flexibility reacquainted auto titrate around his identified CPAP pressure until he returns. He can go ahead with plans for reversal of colostomy as far as his sleep apnea is concerned.

## 2014-03-18 NOTE — Progress Notes (Signed)
03/18/14- 63 yoM Referred by Truitt Merle NP/ Cardiology; stops breathing at night when he is sleeping.  Sleep Study results. Epworth Score: 3 Followed by oncology for adenoCA colon He hopes for clearance for reversal of colostomy. NPSG- 12/04/13- AHI 24.6/ hr, CPAP to 14, weight  348 lbs Wife tells him he snores loudly. Admits some daytime tiredness. No sleep medicines or caffeine. No ENT surgery. Denies history of lung disease but has COPD and his problem list. Ischemic cardiomyopathy, hypertension, history of congestive heart failure. Bedtime between 11 and 12 midnight, sleep latency one half hour, waking 2 or 3 times before up between 7 and 8 AM. Weight gain 35 pounds in the past 2 years.  Prior to Admission medications   Medication Sig Start Date End Date Taking? Authorizing Provider  aspirin EC 81 MG tablet Take 1 tablet (81 mg total) by mouth daily. 03/06/13  Yes Ivan Anchors Love, PA-C  atorvastatin (LIPITOR) 40 MG tablet Take 1 tablet (40 mg total) by mouth daily. 01/31/14  Yes Burtis Junes, NP  carvedilol (COREG) 6.25 MG tablet Take 1.5 tablets (9.375 mg total) by mouth 2 (two) times daily with a meal. 10/15/13  Yes Burtis Junes, NP  CRESTOR 40 MG tablet Take 40 mg by mouth daily. 02/26/14  Yes Historical Provider, MD  ferrous sulfate 325 (65 FE) MG tablet Take 1 tablet (325 mg total) by mouth daily with breakfast. 01/15/14  Yes Burtis Junes, NP  furosemide (LASIX) 40 MG tablet TAKE 1 TABLET BY MOUTH EVERY DAY 02/25/14  Yes Thompson Grayer, MD  KLOR-CON M20 20 MEQ tablet TAKE 1 TABLET TWICE DAILY 12/18/13  Yes Burtis Junes, NP  levalbuterol [facility] HFA) 45 MCG/ACT inhaler Inhale 2 puffs into the lungs every 4 (four) hours as needed for wheezing. 01/31/14  Yes Burtis Junes, NP  Magnesium 500 MG CAPS Take 500 mg by mouth daily.   Yes Historical Provider, MD  Multiple Vitamins-Minerals (MULTIVITAMIN WITH MINERALS) tablet Take 1 tablet by mouth daily.   Yes Historical Provider, MD   nitroGLYCERIN (NITROSTAT) 0.4 MG SL tablet Place 0.4 mg under the tongue every 5 (five) minutes as needed for chest pain.   Yes Historical Provider, MD  pantoprazole (PROTONIX) 40 MG tablet TAKE 1 TABLET (40 MG TOTAL) BY MOUTH AT BEDTIME. 02/19/14  Yes Thompson Grayer, MD  ramipril (ALTACE) 1.25 MG capsule TAKE ONE CAPSULE BY MOUTH EVERY 12 HOURS 12/05/13  Yes Thompson Grayer, MD  simethicone (GAS-X) 80 MG chewable tablet Chew 1 tablet (80 mg total) by mouth 4 (four) times daily. 03/06/13  Yes Ivan Anchors Love, PA-C  spironolactone (ALDACTONE) 25 MG tablet TAKE 1 TABLET (25 MG TOTAL) BY MOUTH DAILY. 02/19/14  Yes Thompson Grayer, MD  HYDROcodone-acetaminophen (NORCO/VICODIN) 5-325 MG per tablet Take 1 tablet by mouth every 6 (six) hours as needed for moderate pain.    Historical Provider, MD   Past Medical History  Diagnosis Date  . Hypertension   . Hypercholesteremia   . CHF (congestive heart failure)     has diastolic heart failure grade 1; EF is 45 to 50% per echo 05/2011  . Coronary artery disease   . COPD (chronic obstructive pulmonary disease)   . Asthma   . Obesity   . Noncompliance   . NSVT (nonsustained ventricular tachycardia)     beta blocker restarted  . Atrial tachycardia     managed on beta blocker therapy  . Colon cancer   . Peripheral arterial disease  small ulcer the head of right metatarsal plantar surface right foot   Past Surgical History  Procedure Laterality Date  . Coronary stent placement      x2  . Cardiac catheterization  11/12/2008    stent x 2 to RCA  . Colonoscopy N/A 02/08/2013    Procedure: COLONOSCOPY;  Surgeon: [name], MD;  Location: [place];  Service: Endoscopy;  Laterality: N/A;  . Esophagogastroduodenoscopy N/A 02/08/2013    Procedure: ESOPHAGOGASTRODUODENOSCOPY (EGD);  Surgeon: [name], MD;  Location: [facility] ENDOSCOPY;  Service: Endoscopy;  Laterality: N/A;  . Laparotomy N/A 02/12/2013    Procedure: EXPLORATORY LAPAROTOMY PARTIAL  COLECTOMY WITH COLOSTOMY;  Surgeon: [name], MD;  Location: [place];  Service: General;  Laterality: N/A;  . Laparotomy N/A 02/18/2013    Procedure: EXPLORATORY LAPAROTOMY/Closure of Wound;  Surgeon: [name], MD;  Location: [facility] OR;  Service: General;  Laterality: N/A;   Family History  Problem Relation Age of Onset  . Hypertension Father   . Heart disease Father   . Diabetes Father   . Hypertension Mother    History   Social History  . Marital Status: Married    Spouse Name: N/A    Number of Children: N/A  . Years of Education: N/A   Occupational History  . Not on file.   Social History Main Topics  . Smoking status: Former Smoker    Types: Cigarettes    Quit date: 07/15/2008  . Smokeless tobacco: Never Used  . Alcohol Use: No     Comment: not since New Year's  . Drug Use: No  . Sexual Activity: No   Other Topics Concern  . Not on file   Social History Narrative   ROS-see HPI Constitutional:   No-   weight loss, night sweats, fevers, chills, +fatigue, lassitude. HEENT:   No-  headaches, difficulty swallowing, tooth/dental problems, sore throat,       No-  sneezing, itching, ear ache, nasal congestion, post nasal drip,  CV:  No-   chest pain, orthopnea, PND, swelling in lower extremities, anasarca,                                  dizziness, palpitations Resp: +  shortness of breath with exertion or at rest.              No-   productive cough,  No non-productive cough,  No- coughing up of blood.              No-   change in color of mucus.  No- wheezing.   Skin: No-   rash or lesions. GI:  No-   heartburn, indigestion, abdominal pain, nausea, vomiting, diarrhea,                 change in bowel habits, loss of appetite GU: No-   dysuria, change in color of urine, no urgency or frequency.  No- flank pain. MS:  No-   joint pain or swelling.  No- decreased range of motion.  No- back pain. Neuro-     nothing unusual Psych:  No- change in mood or affect. No  depression or anxiety.  No memory loss.  OBJ- Physical Exam  big man, + overweight General- Alert, Oriented, Affect-appropriate, Distress- none acute Skin- rash-none, lesions- none, excoriation- none Lymphadenopathy- none Head- atraumatic            Eyes-  Gross vision intact, PERRLA, conjunctivae and secretions clear            Ears- Hearing, canals-normal            Nose- Clear, no-Septal dev, mucus, polyps, erosion, perforation             Throat- Mallampati III-IV , mucosa clear , drainage- none, tonsils- atrophic Neck- flexible , trachea midline, no stridor , thyroid nl, carotid no bruit Chest - symmetrical excursion , unlabored           Heart/CV- RRR , no murmur , no gallop  , no rub, nl s1 s2                           - JVD- none , edema- none, stasis changes- none, varices- none           Lung- clear to P&A, wheeze- none, cough- none , dullness-none, rub- none           Chest wall-  Abd- +right colostomy Br/ Gen/ Rectal- Not done, not indicated Extrem- cyanosis- none, clubbing, none, atrophy- none, strength- nl Neuro- grossly intact to observation

## 2014-03-20 ENCOUNTER — Other Ambulatory Visit: Payer: Self-pay | Admitting: Internal Medicine

## 2014-03-25 ENCOUNTER — Other Ambulatory Visit: Payer: Self-pay | Admitting: Internal Medicine

## 2014-03-31 ENCOUNTER — Other Ambulatory Visit (HOSPITAL_BASED_OUTPATIENT_CLINIC_OR_DEPARTMENT_OTHER): Payer: Medicare Other

## 2014-03-31 ENCOUNTER — Telehealth: Payer: Self-pay | Admitting: Oncology

## 2014-03-31 ENCOUNTER — Ambulatory Visit (HOSPITAL_BASED_OUTPATIENT_CLINIC_OR_DEPARTMENT_OTHER): Payer: Medicare Other | Admitting: Oncology

## 2014-03-31 VITALS — BP 124/77 | HR 113 | Temp 98.0°F | Resp 23 | Ht 70.5 in | Wt 363.0 lb

## 2014-03-31 DIAGNOSIS — Z85038 Personal history of other malignant neoplasm of large intestine: Secondary | ICD-10-CM

## 2014-03-31 DIAGNOSIS — C189 Malignant neoplasm of colon, unspecified: Secondary | ICD-10-CM | POA: Diagnosis not present

## 2014-03-31 LAB — COMPREHENSIVE METABOLIC PANEL (CC13)
ALT: 18 U/L (ref 0–55)
AST: 17 U/L (ref 5–34)
Albumin: 3.9 g/dL (ref 3.5–5.0)
Alkaline Phosphatase: 100 U/L (ref 40–150)
Anion Gap: 10 mEq/L (ref 3–11)
BUN: 25.5 mg/dL (ref 7.0–26.0)
CALCIUM: 9.8 mg/dL (ref 8.4–10.4)
CO2: 23 mEq/L (ref 22–29)
CREATININE: 1.9 mg/dL — AB (ref 0.7–1.3)
Chloride: 107 mEq/L (ref 98–109)
EGFR: 45 mL/min/{1.73_m2} — ABNORMAL LOW (ref >90)
Glucose: 96 mg/dl (ref 70–140)
POTASSIUM: 4.3 meq/L (ref 3.5–5.1)
SODIUM: 141 meq/L (ref 136–145)
Total Bilirubin: 1.27 mg/dL — ABNORMAL HIGH (ref 0.20–1.20)
Total Protein: 7.4 g/dL (ref 6.4–8.3)

## 2014-03-31 LAB — CBC WITH DIFFERENTIAL/PLATELET
BASO%: 1.2 % (ref 0.0–2.0)
Basophils Absolute: 0.1 10*3/uL (ref 0.0–0.1)
EOS%: 4 % (ref 0.0–7.0)
Eosinophils Absolute: 0.2 10*3/uL (ref 0.0–0.5)
HCT: 41.7 % (ref 38.4–49.9)
HEMOGLOBIN: 13.2 g/dL (ref 13.0–17.1)
LYMPH%: 25.4 % (ref 14.0–49.0)
MCH: 28.1 pg (ref 27.2–33.4)
MCHC: 31.7 g/dL — ABNORMAL LOW (ref 32.0–36.0)
MCV: 88.5 fL (ref 79.3–98.0)
MONO#: 0.7 10*3/uL (ref 0.1–0.9)
MONO%: 11.2 % (ref 0.0–14.0)
NEUT%: 58.2 % (ref 39.0–75.0)
NEUTROS ABS: 3.6 10*3/uL (ref 1.5–6.5)
Platelets: 135 10*3/uL — ABNORMAL LOW (ref 140–400)
RBC: 4.71 10*6/uL (ref 4.20–5.82)
RDW: 14.2 % (ref 11.0–14.6)
WBC: 6.2 10*3/uL (ref 4.0–10.3)
lymph#: 1.6 10*3/uL (ref 0.9–3.3)

## 2014-03-31 NOTE — Progress Notes (Addendum)
  [place] OFFICE PROGRESS NOTE   Diagnosis: Colon cancer  INTERVAL HISTORY:   [name] was diagnosed with colon cancer in December 2014. He underwent a partial colectomy/colostomy. The surgery was contacted by wound dehisced since, and a cardiac arrest.  He did not receive adjuvant therapy. [name] reports feeling well at present. He has not seen Dr. Hulen [name] in follow-up to discuss colostomy reversal. He was briefly started ona CPAP mask. No difficulty with bowel function.  Objective:  Vital signs in last 24 hours:  Blood pressure 124/77, pulse 113, temperature 98 F (36.7 C), temperature source Oral, resp. rate 23, height 5' 10.5" (1.791 m), weight 363 lb (164.656 kg), SpO2 97 %.    HEENT: Neck without mass, slight prominence of the right submandibular gland, oral cavity without visible mass, difficult to visualize the pharynx Lymphatics: No cervical, supra-clavicular, axillary, or inguinal nodes Resp: Distant breath sounds, clear bilaterally Cardio: Regular rate and rhythm GI: No hepatomegaly, right abdomen colostomy, no mass Vascular: Chronic stasis change with edema at the left greater than right lower leg  Lab Results:  Lab Results  Component Value Date   WBC 6.2 03/31/2014   HGB 13.2 03/31/2014   HCT 41.7 03/31/2014   MCV 88.5 03/31/2014   PLT 135* 03/31/2014   NEUTROABS 3.6 03/31/2014     Lab Results  Component Value Date   CEA 1.1 11/29/2013    Medications: I have reviewed the patient's current medications.  Assessment/Plan:  1. Adenocarcinoma of the transverse colon, stage IIc (T4 N0), status post a partial colectomy/colostomy 02/12/2013  Microsatellite instability-high, loss of MLH1 and PMS2 expression  No BRAF mutation  Negative for MLH1 methylation  2. COPD  3. Coronary artery disease  4.  History of peripheral arterial disease  Disposition:  [name] remains in clinical remission from colon cancer. We will  follow-up on the CEA from today. I will refer him to Dr. Hulen [name] to see if he is a candidate for colostomy reversal. [name] states cardiology is evaluating him to see if he is a candidate for surgery. I will refer him to Dr. Benson [name] for a surveillance colonoscopy.  [name] will return for an office visit and CEA in 6 months.  Betsy Coder, MD  03/31/2014  12:39 PM

## 2014-03-31 NOTE — Telephone Encounter (Signed)
lvm for pt regarding to 2.15 appt with Dr. Benson Norway @10am 

## 2014-03-31 NOTE — Telephone Encounter (Signed)
gv and printed appt sched and avs fo rpt for March 8th with Dr. Hulen Skains at 9:20am, Dr. Benson Norway office is at lunch and will call me back to sched appt....gv pt appt for Aug

## 2014-04-01 LAB — CEA: CEA: 1.7 ng/mL (ref 0.0–5.0)

## 2014-04-04 DIAGNOSIS — C19 Malignant neoplasm of rectosigmoid junction: Secondary | ICD-10-CM | POA: Diagnosis not present

## 2014-04-14 DIAGNOSIS — Z85038 Personal history of other malignant neoplasm of large intestine: Secondary | ICD-10-CM | POA: Diagnosis not present

## 2014-04-14 DIAGNOSIS — G4733 Obstructive sleep apnea (adult) (pediatric): Secondary | ICD-10-CM | POA: Diagnosis not present

## 2014-04-14 DIAGNOSIS — I2510 Atherosclerotic heart disease of native coronary artery without angina pectoris: Secondary | ICD-10-CM | POA: Diagnosis not present

## 2014-04-17 ENCOUNTER — Other Ambulatory Visit (HOSPITAL_COMMUNITY)
Admission: RE | Admit: 2014-04-17 | Discharge: 2014-04-17 | Disposition: A | Discharge status: Home / Self Care | Payer: Medicare Other | Source: Ambulatory Visit | Attending: Oncology | Admitting: Oncology

## 2014-04-17 DIAGNOSIS — C189 Malignant neoplasm of colon, unspecified: Secondary | ICD-10-CM | POA: Diagnosis not present

## 2014-04-19 ENCOUNTER — Other Ambulatory Visit: Payer: Self-pay | Admitting: Internal Medicine

## 2014-04-19 ENCOUNTER — Other Ambulatory Visit: Payer: Self-pay | Admitting: Nurse Practitioner

## 2014-04-25 ENCOUNTER — Encounter (HOSPITAL_COMMUNITY): Payer: Self-pay

## 2014-04-29 DIAGNOSIS — Z933 Colostomy status: Secondary | ICD-10-CM | POA: Diagnosis not present

## 2014-05-02 ENCOUNTER — Telehealth: Payer: Self-pay | Admitting: *Deleted

## 2014-05-02 NOTE — Telephone Encounter (Signed)
MSI and MMR IHC results to Dr. Gearldine Shown desk for review.

## 2014-05-08 ENCOUNTER — Other Ambulatory Visit: Payer: Self-pay | Admitting: Gastroenterology

## 2014-05-08 ENCOUNTER — Encounter (HOSPITAL_COMMUNITY): Payer: Self-pay | Admitting: *Deleted

## 2014-05-08 NOTE — Progress Notes (Signed)
05-08-14 1600 spoke with spouse - informed of change orf procedure time to 05-07-14 to 0900 AM, please arrive at 0730 AM. Nothing by mouth past midnight except meds with water and follow  bowel prep instructions.

## 2014-05-09 ENCOUNTER — Encounter (HOSPITAL_COMMUNITY): Payer: Self-pay | Admitting: Anesthesiology

## 2014-05-09 ENCOUNTER — Ambulatory Visit (HOSPITAL_COMMUNITY): Payer: Medicare Other | Admitting: Anesthesiology

## 2014-05-09 ENCOUNTER — Encounter (HOSPITAL_COMMUNITY)
Admission: RE | Disposition: A | Discharge status: Home / Self Care | Payer: Self-pay | Source: Ambulatory Visit | Attending: Gastroenterology

## 2014-05-09 ENCOUNTER — Ambulatory Visit (HOSPITAL_COMMUNITY)
Admission: RE | Admit: 2014-05-09 | Discharge: 2014-05-09 | Disposition: A | Discharge status: Home / Self Care | Payer: Medicare Other | Source: Ambulatory Visit | Attending: Gastroenterology | Admitting: Gastroenterology

## 2014-05-09 DIAGNOSIS — I2510 Atherosclerotic heart disease of native coronary artery without angina pectoris: Secondary | ICD-10-CM | POA: Diagnosis not present

## 2014-05-09 DIAGNOSIS — J45909 Unspecified asthma, uncomplicated: Secondary | ICD-10-CM | POA: Insufficient documentation

## 2014-05-09 DIAGNOSIS — I739 Peripheral vascular disease, unspecified: Secondary | ICD-10-CM | POA: Diagnosis not present

## 2014-05-09 DIAGNOSIS — G4733 Obstructive sleep apnea (adult) (pediatric): Secondary | ICD-10-CM | POA: Insufficient documentation

## 2014-05-09 DIAGNOSIS — Z7982 Long term (current) use of aspirin: Secondary | ICD-10-CM | POA: Diagnosis not present

## 2014-05-09 DIAGNOSIS — Z955 Presence of coronary angioplasty implant and graft: Secondary | ICD-10-CM | POA: Diagnosis not present

## 2014-05-09 DIAGNOSIS — E785 Hyperlipidemia, unspecified: Secondary | ICD-10-CM | POA: Insufficient documentation

## 2014-05-09 DIAGNOSIS — G4730 Sleep apnea, unspecified: Secondary | ICD-10-CM | POA: Insufficient documentation

## 2014-05-09 DIAGNOSIS — Z6841 Body Mass Index (BMI) 40.0 and over, adult: Secondary | ICD-10-CM | POA: Diagnosis not present

## 2014-05-09 DIAGNOSIS — I10 Essential (primary) hypertension: Secondary | ICD-10-CM | POA: Insufficient documentation

## 2014-05-09 DIAGNOSIS — K5730 Diverticulosis of large intestine without perforation or abscess without bleeding: Secondary | ICD-10-CM | POA: Insufficient documentation

## 2014-05-09 DIAGNOSIS — Z1211 Encounter for screening for malignant neoplasm of colon: Secondary | ICD-10-CM | POA: Diagnosis not present

## 2014-05-09 DIAGNOSIS — Z85038 Personal history of other malignant neoplasm of large intestine: Secondary | ICD-10-CM | POA: Insufficient documentation

## 2014-05-09 DIAGNOSIS — I5033 Acute on chronic diastolic (congestive) heart failure: Secondary | ICD-10-CM | POA: Insufficient documentation

## 2014-05-09 DIAGNOSIS — Z87891 Personal history of nicotine dependence: Secondary | ICD-10-CM | POA: Diagnosis not present

## 2014-05-09 DIAGNOSIS — Z08 Encounter for follow-up examination after completed treatment for malignant neoplasm: Secondary | ICD-10-CM | POA: Diagnosis not present

## 2014-05-09 HISTORY — PX: COLONOSCOPY WITH PROPOFOL: SHX5780

## 2014-05-09 HISTORY — DX: Colostomy status: Z93.3

## 2014-05-09 SURGERY — COLONOSCOPY WITH PROPOFOL
Anesthesia: Monitor Anesthesia Care

## 2014-05-09 MED ORDER — ONDANSETRON HCL 4 MG/2ML IJ SOLN
INTRAMUSCULAR | Status: AC
  Filled 2014-05-09: qty 6

## 2014-05-09 MED ORDER — PROPOFOL 10 MG/ML IV BOLUS
INTRAVENOUS | Status: AC
  Filled 2014-05-09: qty 20

## 2014-05-09 MED ORDER — PROPOFOL INFUSION 10 MG/ML OPTIME
INTRAVENOUS | Status: DC | PRN
  Administered 2014-05-09: 150 ug/kg/min via INTRAVENOUS

## 2014-05-09 MED ORDER — GLYCOPYRROLATE 0.2 MG/ML IJ SOLN
INTRAMUSCULAR | Status: AC
  Filled 2014-05-09: qty 2

## 2014-05-09 MED ORDER — ATROPINE SULFATE 0.4 MG/ML IJ SOLN
INTRAMUSCULAR | Status: AC
  Filled 2014-05-09: qty 1

## 2014-05-09 MED ORDER — KETAMINE HCL 10 MG/ML IJ SOLN
INTRAMUSCULAR | Status: AC
  Filled 2014-05-09: qty 1

## 2014-05-09 MED ORDER — SODIUM CHLORIDE 0.9 % IV SOLN: INTRAVENOUS | Status: DC

## 2014-05-09 MED ORDER — PHENYLEPHRINE HCL 10 MG/ML IJ SOLN
INTRAMUSCULAR | Status: DC | PRN
  Administered 2014-05-09: 80 ug via INTRAVENOUS

## 2014-05-09 MED ORDER — LACTATED RINGERS IV SOLN
INTRAVENOUS | Status: DC | PRN
  Administered 2014-05-09: 09:00:00 via INTRAVENOUS

## 2014-05-09 MED ORDER — PHENYLEPHRINE 40 MCG/ML (10ML) SYRINGE FOR IV PUSH (FOR BLOOD PRESSURE SUPPORT)
PREFILLED_SYRINGE | INTRAVENOUS | Status: AC
  Filled 2014-05-09: qty 20

## 2014-05-09 MED ORDER — ONDANSETRON HCL 4 MG/2ML IJ SOLN
INTRAMUSCULAR | Status: DC | PRN
  Administered 2014-05-09: 4 mg via INTRAVENOUS

## 2014-05-09 MED ORDER — KETAMINE HCL 10 MG/ML IJ SOLN
INTRAMUSCULAR | Status: DC | PRN
  Administered 2014-05-09: 20 mg via INTRAVENOUS

## 2014-05-09 MED ORDER — SODIUM CHLORIDE 0.9 % IJ SOLN
INTRAMUSCULAR | Status: AC
  Filled 2014-05-09: qty 10

## 2014-05-09 MED ORDER — EPHEDRINE SULFATE 50 MG/ML IJ SOLN
INTRAMUSCULAR | Status: AC
  Filled 2014-05-09: qty 2

## 2014-05-09 SURGICAL SUPPLY — 21 items

## 2014-05-09 NOTE — H&P (View-Only) (Signed)
05-08-14 1600 spoke with spouse - informed of change orf procedure time to 05-07-14 to 0900 AM, please arrive at 0730 AM. Nothing by mouth past midnight except meds with water and follow  bowel prep instructions.

## 2014-05-09 NOTE — Transfer of Care (Signed)
Immediate Anesthesia Transfer of Care Note  Patient: Larry [name]  Procedure(s) Performed: Procedure(s) (LRB): COLONOSCOPY WITH PROPOFOL (N/A)  Patient Location: PACU  Anesthesia Type: MAC  Level of Consciousness: sedated, patient cooperative and responds to stimulation  Airway & Oxygen Therapy: Patient Spontanous Breathing and Patient connected to face mask oxgen  Post-op Assessment: Report given to PACU RN and Post -op Vital signs reviewed and stable  Post vital signs: Reviewed and stable  Complications: No apparent anesthesia complications

## 2014-05-09 NOTE — Anesthesia Postprocedure Evaluation (Signed)
  Anesthesia Post-op Note  Patient: Larry [name]  Procedure(s) Performed: Procedure(s) (LRB): COLONOSCOPY WITH PROPOFOL (N/A)  Patient Location: PACU  Anesthesia Type: MAC  Level of Consciousness: awake and alert   Airway and Oxygen Therapy: Patient Spontanous Breathing  Post-op Pain: mild  Post-op Assessment: Post-op Vital signs reviewed, Patient's Cardiovascular Status Stable, Respiratory Function Stable, Patent Airway and No signs of Nausea or vomiting  Last Vitals:  Filed Vitals:   05/09/14 0815  BP: 113/61  Pulse: 96  Temp: 36.8 C  Resp: 18    Post-op Vital Signs: stable   Complications: No apparent anesthesia complications

## 2014-05-09 NOTE — Anesthesia Preprocedure Evaluation (Signed)
Anesthesia Evaluation  Patient identified by MRN, date of birth, ID band Patient awake    Reviewed: Allergy & Precautions, H&P , NPO status , Patient's Chart, lab work & pertinent test results, reviewed documented beta blocker date and time   History of Anesthesia Complications Negative for: history of anesthetic complications  Airway Mallampati: III  TM Distance: >3 FB Neck ROM: Full    Dental  (+) Teeth Intact, Dental Advisory Given   Pulmonary asthma , sleep apnea , COPDformer smoker,  2L O2 on nursing unit 6N   Pulmonary exam normal       Cardiovascular hypertension, Pt. on medications and Pt. on home beta blockers + CAD, + Cardiac Stents and +CHF + dysrhythmias Supra Ventricular Tachycardia Rate:Tachycardia  CAD with CM; EF 45-50% 05/2011.   Hx of 2 RCA stents 2010. Hx of atrial tachycardia/NSVT on BB and CCB's.  HR currently 140's.   Neuro/Psych    GI/Hepatic S/p ex-lap for colon CA.  Now with fluid draining from abdominal wound.   Endo/Other  Morbid obesity  Renal/GU Renal Insufficiency and ARFRenal disease     Musculoskeletal   Abdominal   Peds  Hematology  (+) Blood dyscrasia, anemia ,   Anesthesia Other Findings   Reproductive/Obstetrics                             Anesthesia Physical  Anesthesia Plan  ASA: III  Anesthesia Plan: MAC   Post-op Pain Management:    Induction: Intravenous  Airway Management Planned: Simple Face Mask  Additional Equipment:   Intra-op Plan:   Post-operative Plan:   Informed Consent: I have reviewed the patients History and Physical, chart, labs and discussed the procedure including the risks, benefits and alternatives for the proposed anesthesia with the patient or authorized representative who has indicated his/her understanding and acceptance.     Plan Discussed with: CRNA, Anesthesiologist and Surgeon  Anesthesia Plan Comments:          Anesthesia Quick Evaluation

## 2014-05-09 NOTE — Interval H&P Note (Signed)
History and Physical Interval Note:  05/09/2014 9:08 AM  Larry [name]  has presented today for surgery, with the diagnosis of screening  The various methods of treatment have been discussed with the patient and family. After consideration of risks, benefits and other options for treatment, the patient has consented to  Procedure(s): COLONOSCOPY WITH PROPOFOL (N/A) as a surgical intervention .  The patient's history has been reviewed, patient examined, no change in status, stable for surgery.  I have reviewed the patient's chart and labs.  Questions were answered to the patient's satisfaction.    No changes to his health.  He is well at this time and tolerated the prep without any issues.  He is MSH1 and PMS2 positive for his colon cancer.  This will be the first time that the has a complete evaluation of his colon.  Thedora Rings D

## 2014-05-09 NOTE — Discharge Instructions (Signed)
Colonoscopy, Care After °These instructions give you information on caring for yourself after your procedure. Your doctor may also give you more specific instructions. Call your doctor if you have any problems or questions after your procedure. °HOME CARE °· Do not drive for 24 hours. °· Do not sign important papers or use machinery for 24 hours. °· You may shower. °· You may go back to your usual activities, but go slower for the first 24 hours. °· Take rest breaks often during the first 24 hours. °· Walk around or use warm packs on your belly (abdomen) if you have belly cramping or gas. °· Drink enough fluids to keep your pee (urine) clear or pale yellow. °· Resume your normal diet. Avoid heavy or fried foods. °· Avoid drinking alcohol for 24 hours or as told by your doctor. °· Only take medicines as told by your doctor. °If a tissue sample (biopsy) was taken during the procedure:  °· Do not take aspirin or blood thinners for 7 days, or as told by your doctor. °· Do not drink alcohol for 7 days, or as told by your doctor. °· Eat soft foods for the first 24 hours. °GET HELP IF: °You still have a small amount of blood in your poop (stool) 2-3 days after the procedure. °GET HELP RIGHT AWAY IF: °· You have more than a small amount of blood in your poop. °· You see clumps of tissue (blood clots) in your poop. °· Your belly is puffy (swollen). °· You feel sick to your stomach (nauseous) or throw up (vomit). °· You have a fever. °· You have belly pain that gets worse and medicine does not help. °MAKE SURE YOU: °· Understand these instructions. °· Will watch your condition. °· Will get help right away if you are not doing well or get worse. °Document Released: 03/12/2010 Document Revised: 02/12/2013 Document Reviewed: 10/15/2012 °ExitCare® Patient Information ©2015 [employer]. This information is not intended to replace advice given to you by your health care provider. Make sure you discuss any questions you have with  your health care provider. ° °

## 2014-05-09 NOTE — Op Note (Signed)
[facility] [place], [address]   COLONOSCOPY PROCEDURE REPORT  PATIENT: Larry [name], Larry [name]  MR#: [id number] BIRTHDATE: March 25, 1950 , 63  yrs. old GENDER: male ENDOSCOPIST: [name], MD REFERRED BY: PROCEDURE DATE:  May 10, 2014 PROCEDURE:   Colonoscopy, diagnostic ASA CLASS:   Class III INDICATIONS: Personal history of colon cancer MEDICATIONS: Monitored anesthesia care  DESCRIPTION OF PROCEDURE:   After the risks and benefits and of the procedure were explained, informed consent was obtained.  revealed no abnormalities of the rectum.    The Pentax Adult Colonscope [id number]  endoscope was introduced through the anus and advanced to the cecum, which was identified by both the appendix and ileocecal valve .  The quality of the prep was good. .  The instrument was then slowly withdrawn as the colon was fully examined. Estimated blood loss is zero unless otherwise noted in this procedure report.     FINDINGS: The long Hartman's pouch was initially evaluated.  The colonoscope was inserted to approximately 60 cm.  There was evidence of diverticula in this region, but no other abnormalities. The colonoscope was then inserted into the ostomy and the remnant transverse colon as well as the ascending colon and cecum were evaluated.  No evidence of any polyps, masses, ulcerations, erosions, vascular abnormalities, or inflammation.     Retroflexion was not performed as there was a large portion of solid stool in the rectum.     The scope was then withdrawn from the patient and the procedure completed.  Technical issues precluded the capture of images.  WITHDRAWAL TIME:  COMPLICATIONS: There were no immediate complications. ENDOSCOPIC IMPRESSION: 1) Left sided diverticula. 2) No evidence of any polyps or masses.  RECOMMENDATIONS: 1) Repeat the colonoscopy in 1 year.  REPEAT EXAM:  cc:  _______________________________ eSignedCarol Ada,  MD 05-10-14 9:42 AM   CPT CODES: ICD CODES:  The ICD and CPT codes recommended by this software are interpretations from the data that the clinical staff has captured with the software.  The verification of the translation of this report to the ICD and CPT codes and modifiers is the sole responsibility of the health care institution and practicing physician where this report was generated.  [employer]. will not be held responsible for the validity of the ICD and CPT codes included on this report.  AMA assumes no liability for data contained or not contained herein. CPT is a Designer, television/film set of the [employer].

## 2014-05-09 NOTE — Progress Notes (Signed)
Residual fecal matter in Roscoe pouch visualized.Longer than usual Hartman pouch.

## 2014-05-12 ENCOUNTER — Emergency Department (HOSPITAL_COMMUNITY)
Admission: EM | Admit: 2014-05-12 | Discharge: 2014-05-12 | Disposition: A | Discharge status: Home / Self Care | Payer: Medicare Other | Attending: Emergency Medicine | Admitting: Emergency Medicine

## 2014-05-12 ENCOUNTER — Emergency Department (HOSPITAL_COMMUNITY): Payer: Medicare Other

## 2014-05-12 ENCOUNTER — Encounter (HOSPITAL_COMMUNITY): Payer: Self-pay | Admitting: Gastroenterology

## 2014-05-12 DIAGNOSIS — S81802A Unspecified open wound, left lower leg, initial encounter: Secondary | ICD-10-CM | POA: Diagnosis not present

## 2014-05-12 DIAGNOSIS — E780 Pure hypercholesterolemia: Secondary | ICD-10-CM | POA: Insufficient documentation

## 2014-05-12 DIAGNOSIS — L03116 Cellulitis of left lower limb: Secondary | ICD-10-CM

## 2014-05-12 DIAGNOSIS — J449 Chronic obstructive pulmonary disease, unspecified: Secondary | ICD-10-CM | POA: Insufficient documentation

## 2014-05-12 DIAGNOSIS — G4730 Sleep apnea, unspecified: Secondary | ICD-10-CM | POA: Insufficient documentation

## 2014-05-12 DIAGNOSIS — I890 Lymphedema, not elsewhere classified: Secondary | ICD-10-CM

## 2014-05-12 DIAGNOSIS — Z85038 Personal history of other malignant neoplasm of large intestine: Secondary | ICD-10-CM | POA: Diagnosis not present

## 2014-05-12 DIAGNOSIS — I2510 Atherosclerotic heart disease of native coronary artery without angina pectoris: Secondary | ICD-10-CM | POA: Diagnosis not present

## 2014-05-12 DIAGNOSIS — Z87891 Personal history of nicotine dependence: Secondary | ICD-10-CM | POA: Insufficient documentation

## 2014-05-12 DIAGNOSIS — Z9981 Dependence on supplemental oxygen: Secondary | ICD-10-CM | POA: Diagnosis not present

## 2014-05-12 DIAGNOSIS — L03126 Acute lymphangitis of left lower limb: Secondary | ICD-10-CM | POA: Diagnosis not present

## 2014-05-12 DIAGNOSIS — I10 Essential (primary) hypertension: Secondary | ICD-10-CM | POA: Diagnosis not present

## 2014-05-12 DIAGNOSIS — I509 Heart failure, unspecified: Secondary | ICD-10-CM | POA: Diagnosis not present

## 2014-05-12 DIAGNOSIS — Z7982 Long term (current) use of aspirin: Secondary | ICD-10-CM | POA: Insufficient documentation

## 2014-05-12 DIAGNOSIS — Z9861 Coronary angioplasty status: Secondary | ICD-10-CM | POA: Insufficient documentation

## 2014-05-12 DIAGNOSIS — Z79899 Other long term (current) drug therapy: Secondary | ICD-10-CM | POA: Diagnosis not present

## 2014-05-12 DIAGNOSIS — M7989 Other specified soft tissue disorders: Secondary | ICD-10-CM | POA: Diagnosis present

## 2014-05-12 DIAGNOSIS — Z9889 Other specified postprocedural states: Secondary | ICD-10-CM | POA: Insufficient documentation

## 2014-05-12 LAB — COMPREHENSIVE METABOLIC PANEL
ALBUMIN: 3.9 g/dL (ref 3.5–5.2)
ALK PHOS: 106 U/L (ref 39–117)
ALT: 19 U/L (ref 0–53)
AST: 17 U/L (ref 0–37)
Anion gap: 8 (ref 5–15)
BUN: 21 mg/dL (ref 6–23)
CHLORIDE: 103 mmol/L (ref 96–112)
CO2: 26 mmol/L (ref 19–32)
Calcium: 9.9 mg/dL (ref 8.4–10.5)
Creatinine, Ser: 1.96 mg/dL — ABNORMAL HIGH (ref 0.50–1.35)
GFR calc Af Amer: 41 mL/min — ABNORMAL LOW (ref >90)
GFR calc non Af Amer: 35 mL/min — ABNORMAL LOW (ref >90)
Glucose, Bld: 93 mg/dL (ref 70–99)
POTASSIUM: 4.6 mmol/L (ref 3.5–5.1)
Sodium: 137 mmol/L (ref 135–145)
Total Bilirubin: 1.4 mg/dL — ABNORMAL HIGH (ref 0.3–1.2)
Total Protein: 7.9 g/dL (ref 6.0–8.3)

## 2014-05-12 LAB — CBC WITH DIFFERENTIAL/PLATELET
BASOS PCT: 0 % (ref 0–1)
Basophils Absolute: 0 10*3/uL (ref 0.0–0.1)
Eosinophils Absolute: 0.2 10*3/uL (ref 0.0–0.7)
Eosinophils Relative: 2 % (ref 0–5)
HEMATOCRIT: 41.1 % (ref 39.0–52.0)
Hemoglobin: 13.1 g/dL (ref 13.0–17.0)
Lymphocytes Relative: 20 % (ref 12–46)
Lymphs Abs: 1.4 10*3/uL (ref 0.7–4.0)
MCH: 28.4 pg (ref 26.0–34.0)
MCHC: 31.9 g/dL (ref 30.0–36.0)
MCV: 89 fL (ref 78.0–100.0)
MONO ABS: 0.5 10*3/uL (ref 0.1–1.0)
Monocytes Relative: 8 % (ref 3–12)
Neutro Abs: 4.6 10*3/uL (ref 1.7–7.7)
Neutrophils Relative %: 70 % (ref 43–77)
Platelets: 175 10*3/uL (ref 150–400)
RBC: 4.62 MIL/uL (ref 4.22–5.81)
RDW: 14.4 % (ref 11.5–15.5)
WBC: 6.7 10*3/uL (ref 4.0–10.5)

## 2014-05-12 MED ORDER — CEPHALEXIN 500 MG PO CAPS: 500.0000 mg | ORAL_CAPSULE | Freq: Four times a day (QID) | ORAL | Status: DC

## 2014-05-12 NOTE — ED Notes (Signed)
Pt reports bilateral leg swelling and having a wound to the left lower leg for two weeks. Pt denies any injury to leg. Pt states his legs swell all the time and didn't pay any attention to the wound. Pt was seen in PCP office today, PCP told pt to come to ED to have leg wound evaluated. Dressing applied by PCP. Pt reports drainage to wound.

## 2014-05-12 NOTE — Discharge Instructions (Signed)

## 2014-05-12 NOTE — ED Provider Notes (Signed)
CSN: [id number]     Arrival date & time 05/12/14  1539 History   First MD Initiated Contact with Patient 05/12/14 1749     Chief Complaint  Patient presents with  . Wound Check  . Leg Swelling     (Consider location/radiation/quality/duration/timing/severity/associated sxs/prior Treatment) Patient is a 64 y.o. male presenting with leg pain.  Leg Pain Location:  Leg Injury: no   Leg location:  L lower leg Pain details:    Quality:  Aching   Radiates to:  Does not radiate   Severity:  Mild   Onset quality:  Gradual   Timing:  Constant   Progression:  Worsening Chronicity:  New Dislocation: no   Foreign body present:  No foreign bodies Tetanus status:  Up to date Prior injury to area:  No Relieved by:  None tried Worsened by:  Nothing tried Ineffective treatments:  None tried Associated symptoms: swelling   Associated symptoms: no decreased ROM and no numbness     Past Medical History  Diagnosis Date  . Hypertension   . Hypercholesteremia   . CHF (congestive heart failure)     has diastolic heart failure grade 1; EF is 45 to 50% per echo 05/2011  . Coronary artery disease   . COPD (chronic obstructive pulmonary disease)   . Asthma   . Obesity   . Noncompliance   . NSVT (nonsustained ventricular tachycardia)     beta blocker restarted  . Atrial tachycardia     managed on beta blocker therapy  . Colon cancer   . Peripheral arterial disease     small ulcer the head of right metatarsal plantar surface right foot  . Sleep apnea     cpap used nightly  . Cardiac arrest     X 2 episodes during hospital visit 12'14"electrolyte imbalance"- "Shocked"  . S/P colostomy     2014   Past Surgical History  Procedure Laterality Date  . Coronary stent placement      x2  . Cardiac catheterization  11/12/2008    stent x 2 to RCA  . Colonoscopy N/A 02/08/2013    Procedure: COLONOSCOPY;  Surgeon: [name], MD;  Location: [place];  Service: Endoscopy;  Laterality:  N/A;  . Esophagogastroduodenoscopy N/A 02/08/2013    Procedure: ESOPHAGOGASTRODUODENOSCOPY (EGD);  Surgeon: [name], MD;  Location[facility] ENDOSCOPY;  Service: Endoscopy;  Laterality: N/A;  . Laparotomy N/A 02/12/2013    Procedure: EXPLORATORY LAPAROTOMY PARTIAL COLECTOMY WITH COLOSTOMY;  Surgeon: [name], MD;  Location: [place];  Service: General;  Laterality: N/A;  . Laparotomy N/A 02/18/2013    Procedure: EXPLORATORY LAPAROTOMY/Closure of Wound;  Surgeon: [name], MD;  Location: [place];  Service: General;  Laterality: N/A;  . Colonoscopy with propofol N/A 05/09/2014    Procedure: COLONOSCOPY WITH PROPOFOL;  Surgeon: [name], MD;  Location: [facility] ENDOSCOPY;  Service: Endoscopy;  Laterality: N/A;   Family History  Problem Relation Age of Onset  . Hypertension Father   . Heart disease Father   . Diabetes Father   . Hypertension Mother    History  Substance Use Topics  . Smoking status: Former Smoker    Types: Cigarettes    Quit date: 07/15/2008  . Smokeless tobacco: Never Used  . Alcohol Use: No     Comment: not since New Year's    Review of Systems  Constitutional: Negative for activity change and appetite change.  Respiratory: Negative for cough and shortness of breath.  Cardiovascular: Positive for leg swelling. Negative for chest pain.  Gastrointestinal: Negative for nausea, vomiting, abdominal pain, diarrhea and constipation.  Genitourinary: Negative for dysuria and difficulty urinating.  All other systems reviewed and are negative.     Allergies  Review of patient's allergies indicates no known allergies.  Home Medications   Prior to Admission medications   Medication Sig Start Date End Date Taking? Authorizing Provider  aspirin EC 81 MG tablet Take 1 tablet (81 mg total) by mouth daily. 03/06/13  Yes Ivan Anchors Love, PA-C  atorvastatin (LIPITOR) 40 MG tablet Take 1 tablet (40 mg total) by mouth daily. Patient taking differently: Take 40 mg by mouth 2  (two) times daily.  01/31/14  Yes Burtis Junes, NP  carvedilol (COREG) 6.25 MG tablet Take 1.5 tablets (9.375 mg total) by mouth 2 (two) times daily with a meal. 10/15/13  Yes Burtis Junes, NP  ferrous sulfate 325 (65 FE) MG tablet Take 1 tablet (325 mg total) by mouth daily with breakfast. 01/15/14  Yes Burtis Junes, NP  furosemide (LASIX) 40 MG tablet TAKE 1 TABLET BY MOUTH EVERY DAY 04/21/14  Yes Thompson Grayer, MD  KLOR-CON M20 20 MEQ tablet TAKE 1 TABLET TWICE DAILY 04/21/14  Yes Thompson Grayer, MD  levalbuterol [facility] HFA) 45 MCG/ACT inhaler Inhale 2 puffs into the lungs every 4 (four) hours as needed for wheezing. 01/31/14  Yes Burtis Junes, NP  Magnesium 500 MG CAPS Take 500 mg by mouth daily.   Yes Historical Provider, MD  Multiple Vitamins-Minerals (MULTIVITAMIN WITH MINERALS) tablet Take 1 tablet by mouth daily.   Yes Historical Provider, MD  pantoprazole (PROTONIX) 40 MG tablet TAKE 1 TABLET (40 MG TOTAL) BY MOUTH AT BEDTIME. 04/21/14  Yes Thompson Grayer, MD  ramipril (ALTACE) 1.25 MG capsule TAKE ONE CAPSULE BY MOUTH EVERY 12 HOURS 03/27/14  Yes Thompson Grayer, MD  simethicone (GAS-X) 80 MG chewable tablet Chew 1 tablet (80 mg total) by mouth 4 (four) times daily. 03/06/13  Yes Ivan Anchors Love, PA-C  spironolactone (ALDACTONE) 25 MG tablet TAKE 1 TABLET (25 MG TOTAL) BY MOUTH DAILY. 04/21/14  Yes Thompson Grayer, MD  cephALEXin (KEFLEX) 500 MG capsule Take 1 capsule (500 mg total) by mouth 4 (four) times daily. 05/12/14   Larence Penning, MD  nitroGLYCERIN (NITROSTAT) 0.4 MG SL tablet Place 0.4 mg under the tongue every 5 (five) minutes as needed for chest pain.    Historical Provider, MD   BP 125/73 mmHg  Pulse 90  Temp(Src) 98.2 F (36.8 C) (Oral)  Resp 20  Ht 5\' 10"  (1.778 m)  Wt 364 lb (165.109 kg)  BMI 52.23 kg/m2  SpO2 100% Physical Exam  Constitutional: He is oriented to person, place, and time. He appears well-developed and well-nourished. No distress.  Morbidly obese male in no  acute distress  HENT:  Head: Normocephalic and atraumatic.  Mouth/Throat: Oropharynx is clear and moist. No oropharyngeal exudate.  Eyes: Conjunctivae and EOM are normal. Pupils are equal, round, and reactive to light.  Neck: Normal range of motion. Neck supple.  Cardiovascular: Normal rate, regular rhythm, normal heart sounds and intact distal pulses.  Exam reveals no gallop and no friction rub.   No murmur heard. Pulmonary/Chest: Effort normal and breath sounds normal. No respiratory distress. He has no wheezes. He has no rales.  Abdominal: Soft. He exhibits no distension and no mass. There is no tenderness. There is no rebound and no guarding.  Ostomy to right lower quadrant  Musculoskeletal: Normal range of motion. He exhibits edema (2+ pitting edema to bilateral lower extremities). He exhibits no tenderness.  Left lower calf with 5 cm wound. Weeping erythema without purulence. Surrounding erythema is mild.  Lymphadenopathy:    He has no cervical adenopathy.  Neurological: He is alert and oriented to person, place, and time. No cranial nerve deficit.  Skin: Skin is warm and dry. Rash noted. He is not diaphoretic.  Psychiatric: He has a normal mood and affect. His behavior is normal. Judgment and thought content normal.  Nursing note and vitals reviewed.   ED Course  Procedures (including critical care time) Labs Review Labs Reviewed  COMPREHENSIVE METABOLIC PANEL - Abnormal; Notable for the following:    Creatinine, Ser 1.96 (*)    Total Bilirubin 1.4 (*)    GFR calc non Af Amer 35 (*)    GFR calc Af Amer 41 (*)    All other components within normal limits  CBC WITH DIFFERENTIAL/PLATELET    Imaging Review Dg Tibia/fibula Left  05/12/2014   CLINICAL DATA:  Leg swelling and wound to the lower left leg for 2 weeks.  EXAM: LEFT TIBIA AND FIBULA - 2 VIEW  COMPARISON:  None.  FINDINGS: Diffuse soft tissue swelling. Vascular calcifications. No well-defined soft tissue ulcer is  identified. No osseous destruction. Calcaneal spur incidentally noted. No soft tissue gas.  IMPRESSION: No acute osseous abnormality.   Electronically Signed   By: Abigail Miyamoto M.D.   On: 05/12/2014 19:30     EKG Interpretation None      MDM   Final diagnoses:  Chronic acquired lymphedema  Cellulitis of left lower extremity   64 year old male with a past medical history of morbid obesity and chronic edema presents with open leg wound. His lymphedema of his left lower leg has broken open and he has some mild surrounding erythema with nonpurulent drainage. Denies fevers or other symptoms. No asymmetric swelling. No leg pain.  The patient needs wound care follow-up. No signs of infection on my exam and he is afebrile and vital signs were stable. White count is normal and plain film shows no subcutaneous gas or ostial involvement. He'll we will refer back to wound care and start him on antibiotics for mild surrounding cellulitis.   Larence Penning, MD 05/12/14 1949  Leonard Schwartz, MD 05/17/14 [phone]

## 2014-05-17 ENCOUNTER — Other Ambulatory Visit: Payer: Self-pay | Admitting: Internal Medicine

## 2014-05-17 ENCOUNTER — Other Ambulatory Visit: Payer: Self-pay | Admitting: Nurse Practitioner

## 2014-05-19 ENCOUNTER — Encounter: Payer: Self-pay | Admitting: Internal Medicine

## 2014-05-19 ENCOUNTER — Ambulatory Visit: Payer: Medicare Other | Admitting: Internal Medicine

## 2014-05-19 VITALS — BP 124/80 | HR 91 | Ht 71.0 in | Wt 342.0 lb

## 2014-05-19 DIAGNOSIS — G4733 Obstructive sleep apnea (adult) (pediatric): Secondary | ICD-10-CM

## 2014-05-19 NOTE — Progress Notes (Signed)
03/18/14- 63 yoM Referred by Truitt Merle NP/ Cardiology; stops breathing at night when he is sleeping.  Sleep Study results. Epworth Score: 3 Followed by oncology for adenoCA colon He hopes for clearance for reversal of colostomy. NPSG- 12/04/13- AHI 24.6/ hr, CPAP to 14, weight  348 lbs Wife tells him he snores loudly. Admits some daytime tiredness. No sleep medicines or caffeine. No ENT surgery. Denies history of lung disease but has COPD and his problem list. Ischemic cardiomyopathy, hypertension, history of congestive heart failure. Bedtime between 11 and 12 midnight, sleep latency one half hour, waking 2 or 3 times before up between 7 and 8 AM. Weight gain 35 pounds in the past 2 years.  05/19/14- 10 yoM followed for OSA complicated by COPD,  Ischemic cardiomyopathy, hypertension, history of congestive heart failure, colon cancer FOLLOWS FOR states wearing cpap auto 10-20 every night for about 5-6 hours. No complaints voiced about machine. States pressure and equipment working fine.     ROS-see HPI Constitutional:   No-   weight loss, night sweats, fevers, chills, +fatigue, lassitude. HEENT:   No-  headaches, difficulty swallowing, tooth/dental problems, sore throat,       No-  sneezing, itching, ear ache, nasal congestion, post nasal drip,  CV:  No-   chest pain, orthopnea, PND, swelling in lower extremities, anasarca,                                  dizziness, palpitations Resp: +  shortness of breath with exertion or at rest.              No-   productive cough,  No non-productive cough,  No- coughing up of blood.              No-   change in color of mucus.  No- wheezing.   Skin: No-   rash or lesions. GI:  No-   heartburn, indigestion, abdominal pain, nausea, vomiting, diarrhea,                 change in bowel habits, loss of appetite GU: No-   dysuria, change in color of urine, no urgency or frequency.  No- flank pain. MS:  No-   joint pain or swelling.  No- decreased range of  motion.  No- back pain. Neuro-     nothing unusual Psych:  No- change in mood or affect. No depression or anxiety.  No memory loss.  OBJ- Physical Exam  big man, + overweight General- Alert, Oriented, Affect-appropriate, Distress- none acute Skin- rash-none, lesions- none, excoriation- none Lymphadenopathy- none Head- atraumatic            Eyes- Gross vision intact, PERRLA, conjunctivae and secretions clear            Ears- Hearing, canals-normal            Nose- Clear, no-Septal dev, mucus, polyps, erosion, perforation             Throat- Mallampati III-IV , mucosa clear , drainage- none, tonsils- atrophic Neck- flexible , trachea midline, no stridor , thyroid nl, carotid no bruit Chest - symmetrical excursion , unlabored           Heart/CV- RRR , no murmur , no gallop  , no rub, nl s1 s2                           -  JVD- none , edema- none, stasis changes- none, varices- none           Lung- clear to P&A, wheeze- none, cough- none , dullness-none, rub- none           Chest wall-  Abd- +right colostomy Br/ Gen/ Rectal- Not done, not indicated Extrem- cyanosis- none, clubbing, none, atrophy- none, strength- nl Neuro- grossly intact to observation

## 2014-05-19 NOTE — Patient Instructions (Signed)
Our goal is to use the CPAP at least 4 hours per night on at least 70% of nights. Hopefully you will be able to use it all night and any time you sleep.  We will leave the pressure set on auto for now, between 10 and 20 cwp. We can change that if it seems to high or too low.  Consider trying otc nasal saline gel as a soothing ointment for your tender nostril when ever you want.

## 2014-05-20 ENCOUNTER — Encounter: Payer: Self-pay | Admitting: Nurse Practitioner

## 2014-05-20 ENCOUNTER — Ambulatory Visit (INDEPENDENT_AMBULATORY_CARE_PROVIDER_SITE_OTHER): Payer: Medicare Other | Admitting: Nurse Practitioner

## 2014-05-20 VITALS — BP 118/80 | HR 101 | Ht 71.0 in | Wt 362.0 lb

## 2014-05-20 DIAGNOSIS — I255 Ischemic cardiomyopathy: Secondary | ICD-10-CM

## 2014-05-20 DIAGNOSIS — Z01810 Encounter for preprocedural cardiovascular examination: Secondary | ICD-10-CM | POA: Diagnosis not present

## 2014-05-20 DIAGNOSIS — I5022 Chronic systolic (congestive) heart failure: Secondary | ICD-10-CM | POA: Diagnosis not present

## 2014-05-20 MED ORDER — CARVEDILOL 12.5 MG PO TABS: 12.5000 mg | ORAL_TABLET | Freq: Two times a day (BID) | ORAL | Status: DC

## 2014-05-20 NOTE — Progress Notes (Signed)
CARDIOLOGY OFFICE NOTE  Date:  05/20/2014    Larry [name] Date of Birth: 03-Jan-1951 Medical Record #[id number]  PCP:  Kristine Garbe, MD  Cardiologist:  Allred  Chief Complaint  Patient presents with  . Pre-op Exam    Surgical clearance - seen for Dr. Rayann Heman.    History of Present Illness: Larry [name] is a 64 y.o. male who presents today for a pre op clearance. Seen for Dr. Rayann Heman. He has an ischemic CM with severe diffuse 3VD and prior stents to the RCA in 2010 per Dr. Angelena Form, systolic and diastolic dysfunction, HTN, HLD and obesity. Has had a history of atrial tach that has been treated with beta blocker. Was hospitalized back in April of 2013 with a HF exacerbation - had stopped his medicines. Last EF of 40 - to 40% with diastolic dysfunction as well noted in January of 2015.   Seen by me back in October of 2014 - Was out of his medicines again. Had lost weight and was anemic - was sending to GI and ended up having colon cancer.   He was admitted (02/02/2013 - 03/01/2013) to [facility] with subacute history of progressive weakness, constipation and difficulty eating and drinking. He presented with acute renal failure (Creatinine 7.83) and severe anemia (Hbg of 7.3) as well as obstruction. Colonoscopy doing this admission with biopsy revealed adenocarcinoma and patient underwent exploratory lap with partial colectomy with colostomy on 12/23 by Dr. Hulen Skains. Recovery was complicated by wound dehissance, requiring a return to the OR 12/29 with abdominal wound closure with VAC placment. After the procedure, the patient developed atrial tachycardia, so a dilt drip was started (12/29-1/2). On 1/4, the patient had a cardiac arrest, requiring defibrillation, and was subsequently transferred to the ICU. QT was found to be > 700, with mild hypokalemia and hypomagnesemia implicated as the cause. Lidocaine drip was started. Electrolyte abnormality treated and 2D-echo  revealed an EF of 40-45% with periapical akinesis and biatrial enlargement. He experienced recurrent torsades on 01/05 and shocked in NSR. By 1/6, QTc had decreased to 499, lidocaine drip was d/c'ed, and patient was transferred out of ICU to stepdown. Then admitted to physical medicine and rehabilitation on 01/09 due to severe deconditioning. He was continued on protein supplement to promote wound healing. His abdominal wound was treated with VAC through 03/05/13. His heart rate was controlled without recurrent arrythmia and was maintained on potassium and magnesium supplements. He was discharged to home on 03/07/2013. He was last evaluated by Dr. Hulen Skains on 02/03 with review of his pathology demonstrating invasive adenocarcinoma of the transverse colon with penetration into the pericolonic fat. He had 0/18 lymph nodes negative for metastatic disease. He was pathologic stage [id number].   Saw Dr. Gwenlyn Found in early July of 2015 for a foot ulcer - basically normal ABI's done.   He was readmitted to the hospital in August of 2015 - triggered by too much salt. Presented with respiratory distress - treated with Bipap and quickly stabilized. Was diuresed about 6 liters as well. Started on low dose ACE. Sleep study recommended.  I have seen back here several times since that hospitalization - last in December - he had improved. Titrated as much medicine as BP could tolerate. Felt to be stable from our standpoint. Referred to Sleep Medicine/Dr. Annamaria Boots and had CPAP for his OSA.   Comes in today. Here alone. Planning on colostomy reversal with Dr. Hulen Skains. This has not been scheduled. He says he  feels pretty good. Denies any real shortness of breath. He is pretty sedentary. Saw Dr. Annamaria Boots yesterday for his OSA - tries to wear his CPAP about 4 hours a night - has trouble with the mask. No chest pain. Weight yesterday most likely not accurate. He weighed 258 at my last OV. He was in the ER last week with swelling and a "blister  that popped open". Treated with antibiotics. Swelling has improved - he can get his usual shoes back on. To be followed in the wound clinic. He does feel his heart racing at times. No syncope. Recent lab from the ER noted. He has had his colonoscopy and got a good report.    Past Medical History  Diagnosis Date  . Hypertension   . Hypercholesteremia   . CHF (congestive heart failure)     has diastolic heart failure grade 1; EF is 45 to 50% per echo 05/2011  . Coronary artery disease   . COPD (chronic obstructive pulmonary disease)   . Asthma   . Obesity   . Noncompliance   . NSVT (nonsustained ventricular tachycardia)     beta blocker restarted  . Atrial tachycardia     managed on beta blocker therapy  . Colon cancer   . Peripheral arterial disease     small ulcer the head of right metatarsal plantar surface right foot  . Sleep apnea     cpap used nightly  . Cardiac arrest     X 2 episodes during hospital visit 12'14"electrolyte imbalance"- "Shocked"  . S/P colostomy     2014    Past Surgical History  Procedure Laterality Date  . Coronary stent placement      x2  . Cardiac catheterization  11/12/2008    stent x 2 to RCA  . Colonoscopy N/A 02/08/2013    Procedure: COLONOSCOPY;  Surgeon: [name], MD;  Location: [place];  Service: Endoscopy;  Laterality: N/A;  . Esophagogastroduodenoscopy N/A 02/08/2013    Procedure: ESOPHAGOGASTRODUODENOSCOPY (EGD);  Surgeon: [name], MD;  Location: [facility] ENDOSCOPY;  Service: Endoscopy;  Laterality: N/A;  . Laparotomy N/A 02/12/2013    Procedure: EXPLORATORY LAPAROTOMY PARTIAL COLECTOMY WITH COLOSTOMY;  Surgeon: [name], MD;  Location: [place];  Service: General;  Laterality: N/A;  . Laparotomy N/A 02/18/2013    Procedure: EXPLORATORY LAPAROTOMY/Closure of Wound;  Surgeon: [name], MD;  Location: [place];  Service: General;  Laterality: N/A;  . Colonoscopy with propofol N/A 05/09/2014    Procedure: COLONOSCOPY WITH  PROPOFOL;  Surgeon: [name], MD;  Location: [facility] ENDOSCOPY;  Service: Endoscopy;  Laterality: N/A;     Medications: Current Outpatient Prescriptions  Medication Sig Dispense Refill  . aspirin EC 81 MG tablet Take 1 tablet (81 mg total) by mouth daily. 30 tablet 0  . atorvastatin (LIPITOR) 40 MG tablet Take 1 tablet (40 mg total) by mouth daily. (Patient taking differently: Take 40 mg by mouth daily at 6 PM. ) 90 tablet 3  . ferrous sulfate 325 (65 FE) MG tablet Take 1 tablet (325 mg total) by mouth daily with breakfast. 30 tablet 1  . furosemide (LASIX) 40 MG tablet TAKE 1 TABLET BY MOUTH EVERY DAY 30 tablet 0  . KLOR-CON M20 20 MEQ tablet TAKE 1 TABLET TWICE DAILY 60 tablet 3  . levalbuterol (XOPENEX HFA) 45 MCG/ACT inhaler Inhale 2 puffs into the lungs every 4 (four) hours as needed for wheezing. 1 Inhaler 11  . Magnesium 500 MG CAPS Take  500 mg by mouth daily.    . Multiple Vitamins-Minerals (MULTIVITAMIN WITH MINERALS) tablet Take 1 tablet by mouth daily.    . nitroGLYCERIN (NITROSTAT) 0.4 MG SL tablet Place 0.4 mg under the tongue every 5 (five) minutes as needed for chest pain.    . pantoprazole (PROTONIX) 40 MG tablet TAKE 1 TABLET (40 MG TOTAL) BY MOUTH AT BEDTIME. 30 tablet 0  . ramipril (ALTACE) 1.25 MG capsule TAKE ONE CAPSULE BY MOUTH EVERY 12 HOURS 60 capsule 3  . simethicone (GAS-X) 80 MG chewable tablet Chew 1 tablet (80 mg total) by mouth 4 (four) times daily. 120 tablet 0  . spironolactone (ALDACTONE) 25 MG tablet TAKE 1 TABLET (25 MG TOTAL) BY MOUTH DAILY. 30 tablet 0  . carvedilol (COREG) 12.5 MG tablet Take 1 tablet (12.5 mg total) by mouth 2 (two) times daily. 180 tablet 3   No current facility-administered medications for this visit.    Allergies: No Known Allergies  Social History: The patient  reports that he quit smoking about 5 years ago. His smoking use included Cigarettes. He has never used smokeless tobacco. He reports that he does not drink alcohol or use  illicit drugs.   Family History: The patient's family history includes Diabetes in his father; Heart disease in his father; Hypertension in his father and mother.   Review of Systems: Please see the history of present illness.   Otherwise, the review of systems is positive for .   All other systems are reviewed and negative.   Physical Exam: VS:  BP 118/80 mmHg  Pulse 101  Ht 5\' 11"  (1.803 m)  Wt 362 lb (164.202 kg)  BMI 50.51 kg/m2 .  BMI Body mass index is 50.51 kg/(m^2).  Wt Readings from Last 3 Encounters:  05/20/14 362 lb (164.202 kg)  05/19/14 342 lb (155.13 kg)  05/12/14 364 lb (165.109 kg)    General: Pleasant. He is a morbidly obese black male who is in no acute distress.  HEENT: Normal. Neck: Supple, no JVD, carotid bruits, or masses noted.  Cardiac: Regular rate and rhythm. Heart tones are distant due to body habitus. Lower legs quite full with edema that is chronic. He has his tennis shoes on today. Respiratory:  Lungs are clear to auscultation bilaterally with normal work of breathing.  GI: Soft and nontender. Colostomy in place.  MS: No deformity or atrophy. Gait and ROM intact. Skin: Warm and dry. Color is normal.  Neuro:  Strength and sensation are intact and no gross focal deficits noted.  Psych: Alert, appropriate and with normal affect.   LABORATORY DATA:  EKG:  EKG is ordered today. This demonstrates sinus tachcardia - brief run of atrial tach as well.  Lab Results  Component Value Date   WBC 6.7 05/12/2014   HGB 13.1 05/12/2014   HCT 41.1 05/12/2014   PLT 175 05/12/2014   GLUCOSE 93 05/12/2014   CHOL 158 10/02/2013   TRIG 117 10/02/2013   HDL 67 10/02/2013   LDLCALC 68 10/02/2013   ALT 19 05/12/2014   AST 17 05/12/2014   NA 137 05/12/2014   K 4.6 05/12/2014   CL 103 05/12/2014   CREATININE 1.96* 05/12/2014   BUN 21 05/12/2014   CO2 26 05/12/2014   TSH 0.70 12/06/2012   INR 1.00 09/30/2013   HGBA1C 6.0 11/26/2009    BNP (last 3  results) No results for input(s): BNP in the last 8760 hours.  ProBNP (last 3 results)  Recent Labs  09/30/13 0736 10/15/13 1117 01/31/14 1012  PROBNP 397.2* 9.0 7.0     Other Studies Reviewed Today:  Echo Study Conclusions from January 2015  - Left ventricle: The cavity size was mildly dilated. Wall thickness was increased in a pattern of mild LVH. Systolic function was mildly to moderately reduced. The estimated ejection fraction was in the range of 40% to 45%. There was akinesis of the apical lateral, apical anterior, and apical septal segments. There was akinesis of the true apex. Prominent trabeculation at the apex but I do not think thrombus was present. Patient had Definity study recently to look at the apex and no thrombus was seen. Doppler parameters are consistent with abnormal left ventricular relaxation (grade 1 diastolic dysfunction). - Aortic valve: Trileaflet; moderately calcified leaflets. There was no stenosis. - Mitral valve: Moderately calcified annulus. Mildly calcified leaflets . Trivial regurgitation. - Left atrium: The atrium was moderately dilated. - Right ventricle: The cavity size was normal. Systolic function was normal. - Right atrium: The atrium was mildly dilated. - Pulmonary arteries: No complete TR doppler jet so unable to estimate PA systolic pressure. - Systemic veins: IVC was not visualized. - Pericardium, extracardiac: A trivial pericardial effusion was identified. Impressions:  - Mildly dilated LV with mild LV hypertrophy. EF 40-45% with peri-apical akinesis. I do not think that LV apical thrombus is present. Normal RV size and systolic function. Biatrial enlargement.   Assessment / Plan:  1. Pre op clearance - lots of cardiac issues - at best he will be at increased risk given his issues - I think he needs a Lexiscan before proceeding on to make sure he does not have a high risk study. He will need to be monitored on telemetry  given his past issues.   2. Combined systolic and diastolic HF - he has chronic issues with his volume.   3. CAD with prior PCI - no chest pain. Will arrange for Lexiscan  4. Colon cancer - followed by oncology  5. HTN - stable - BP limiting how much HF meds he can have.   5. CKD - on very low dose ACE   6. OSA - now on CPAP and followed by Dr. Annamaria Boots  7. HLD - on statin  8. Atrial tachycardia - will try to increase his Coreg to 12.5 mg BID.   Current medicines are reviewed with the patient today.  The patient does not have concerns regarding medicines other than what has been noted above.  The following changes have been made:  See above.  Labs/ tests ordered today include:    Orders Placed This Encounter  Procedures  . Myocardial Perfusion Imaging  . EKG 12-Lead     Disposition:   FU with me in 4 months with fasting labs  Patient is agreeable to this plan and will call if any problems develop in the interim.   Signed: Burtis Junes, RN, ANP-C 05/20/2014 9:56 AM  [facility] [address] [place], [place]  [address] Phone: [phone] Fax: [phone]

## 2014-05-20 NOTE — Patient Instructions (Addendum)
Stay on your current medicines  I am increasing the Coreg to 12.5 mg twice a day - I have sent this to your drug store  We will arrange for a 2 day [facility]  If your stress test looks ok - we will let you proceed on with your surgery  See me in 4 months with fasting labs  Call the [place] office at [phone] if you have any questions, problems or concerns.

## 2014-05-27 ENCOUNTER — Encounter (HOSPITAL_BASED_OUTPATIENT_CLINIC_OR_DEPARTMENT_OTHER): Payer: Medicare Other | Attending: General Surgery

## 2014-05-27 DIAGNOSIS — I87312 Chronic venous hypertension (idiopathic) with ulcer of left lower extremity: Secondary | ICD-10-CM | POA: Insufficient documentation

## 2014-05-27 DIAGNOSIS — L97821 Non-pressure chronic ulcer of other part of left lower leg limited to breakdown of skin: Secondary | ICD-10-CM | POA: Diagnosis not present

## 2014-05-29 ENCOUNTER — Telehealth: Payer: Self-pay | Admitting: *Deleted

## 2014-05-29 ENCOUNTER — Ambulatory Visit (HOSPITAL_COMMUNITY): Payer: Medicare Other | Attending: Cardiology | Admitting: Radiology

## 2014-05-29 ENCOUNTER — Other Ambulatory Visit (HOSPITAL_COMMUNITY)
Admission: RE | Admit: 2014-05-29 | Discharge: 2014-05-29 | Disposition: A | Discharge status: Home / Self Care | Payer: Medicare Other | Source: Ambulatory Visit | Attending: Oncology | Admitting: Oncology

## 2014-05-29 DIAGNOSIS — Z01810 Encounter for preprocedural cardiovascular examination: Secondary | ICD-10-CM

## 2014-05-29 DIAGNOSIS — R002 Palpitations: Secondary | ICD-10-CM | POA: Insufficient documentation

## 2014-05-29 DIAGNOSIS — C19 Malignant neoplasm of rectosigmoid junction: Secondary | ICD-10-CM | POA: Insufficient documentation

## 2014-05-29 DIAGNOSIS — I5022 Chronic systolic (congestive) heart failure: Secondary | ICD-10-CM

## 2014-05-29 DIAGNOSIS — I10 Essential (primary) hypertension: Secondary | ICD-10-CM | POA: Insufficient documentation

## 2014-05-29 DIAGNOSIS — Z87891 Personal history of nicotine dependence: Secondary | ICD-10-CM | POA: Insufficient documentation

## 2014-05-29 DIAGNOSIS — I2510 Atherosclerotic heart disease of native coronary artery without angina pectoris: Secondary | ICD-10-CM | POA: Diagnosis not present

## 2014-05-29 DIAGNOSIS — J45909 Unspecified asthma, uncomplicated: Secondary | ICD-10-CM | POA: Insufficient documentation

## 2014-05-29 DIAGNOSIS — I255 Ischemic cardiomyopathy: Secondary | ICD-10-CM | POA: Diagnosis not present

## 2014-05-29 MED ORDER — TECHNETIUM TC 99M SESTAMIBI GENERIC - CARDIOLITE
33.0000 | Freq: Once | INTRAVENOUS | Status: AC | PRN
  Administered 2014-05-29: 33 via INTRAVENOUS

## 2014-05-29 MED ORDER — REGADENOSON 0.4 MG/5ML IV SOLN
0.4000 mg | Freq: Once | INTRAVENOUS | Status: AC
  Administered 2014-05-29: 0.4 mg via INTRAVENOUS

## 2014-05-29 NOTE — Telephone Encounter (Signed)
Requested pathology add BRAF testing on [id number]. Per Dr. Benay Spice: If negative, will need MLH1 methylation testing.

## 2014-05-29 NOTE — Progress Notes (Signed)
[facility] [address] [place], [place] [address] [phone]    Cardiology Nuclear Med Study  Larry [name] is a 64 y.o. male     MRN : [id number]     DOB: [birth year]  Procedure Date: 05/29/2014  Nuclear Med Background Indication for Stress Test:  Evaluation for Ischemia and Surgical Clearance-reverse colostomy( Dr. Lebron Quam) History:  CAD, Asthma Cardiac Risk Factors: Hypertension  Symptoms:  Palpitations   Nuclear Pre-Procedure Caffeine/Decaff Intake:  None NPO After: 9:00pm   Lungs:  clear O2 Sat: 94% on room air. IV 0.9% NS with Angio Cath:  22g  IV Site: R Hand  IV Started by:  Crissie Figures, RN  Chest Size (in):  50+ Cup Size: n/a  Height: 6\' 1"  (1.854 m)  Weight:  363 lb (164.656 kg)  BMI:  Body mass index is 47.9 kg/(m^2). Tech Comments:  N/A    Nuclear Med Study 1 or 2 day study: 2 day  Stress Test Type:  Lexiscan  Reading MD: N/A  Order Authorizing Provider:  Thompson Grayer, MD  Resting Radionuclide: Technetium 80m Sestamibi  Resting Radionuclide Dose: 33.0 mCi on 06/04/14   Stress Radionuclide:  Technetium 74m Sestamibi  Stress Radionuclide Dose: 33.0 mCi on 05/29/14           Stress Protocol Rest HR: 84 Stress HR: 129  Rest BP: 105/67 Stress BP: 89/54  Exercise Time (min): n/a METS: n/a   Predicted Max HR: 159 bpm % Max HR: 81.13 bpm Rate Pressure Product: 13158   Dose of Adenosine (mg):  n/a Dose of Lexiscan: 0.4 mg  Dose of Atropine (mg): n/a Dose of Dobutamine: n/a mcg/kg/min (at max HR)  Stress Test Technologist: Glade Lloyd, BS-ES  Nuclear Technologist:  Earl Many, CNMT     Rest Procedure:  Myocardial perfusion imaging was performed at rest 45 minutes following the intravenous administration of Technetium 20m Sestamibi. Rest ECG: NSR poor R wave progression  Stress Procedure:  The patient received IV Lexiscan 0.4 mg over 15-seconds.  Technetium 27m Sestamibi injected at 30-seconds.  Quantitative spect  images were obtained after a 45 minute delay.  During the infusion of Lexiscan the patient complained of SOB that resolved in recovery.  Stress ECG: No significant change from baseline ECG  QPS Raw Data Images:  Normal; no motion artifact; normal heart/lung ratio. Stress Images:  There is decreased uptake in the inferior wall. Rest Images:  There is decreased uptake in the inferior wall. Subtraction (SDS):  These findings are consistent with ischemia. Transient Ischemic Dilatation (Normal <1.22):  1.12 Lung/Heart Ratio (Normal <0.45):  0.39  Quantitative Gated Spect Images QGS EDV:  233 ml QGS ESV:  138 ml  Impression Exercise Capacity:  Lexiscan with no exercise. BP Response:  Normal blood pressure response. Clinical Symptoms:  There is dyspnea. ECG Impression:  No significant ST segment change suggestive of ischemia. Comparison with Prior Nuclear Study: No images to compare  Overall Impression:  High risk stress nuclear study large inferior wall infarct from apex to base.  Also suggestion of apical, anterior and lateral wall ischemia.  LV Ejection Fraction: 41%.  LV Wall Motion:  Inferiior akinesis  Consistent with possible 3 VD  Consider cath before major surgery   Jenkins Rouge

## 2014-05-30 ENCOUNTER — Encounter: Payer: Self-pay | Admitting: Vascular Surgery

## 2014-05-30 ENCOUNTER — Other Ambulatory Visit: Payer: Self-pay | Admitting: *Deleted

## 2014-05-30 DIAGNOSIS — L97929 Non-pressure chronic ulcer of unspecified part of left lower leg with unspecified severity: Secondary | ICD-10-CM

## 2014-05-30 DIAGNOSIS — I83029 Varicose veins of left lower extremity with ulcer of unspecified site: Secondary | ICD-10-CM

## 2014-06-03 DIAGNOSIS — I87312 Chronic venous hypertension (idiopathic) with ulcer of left lower extremity: Secondary | ICD-10-CM | POA: Diagnosis not present

## 2014-06-03 DIAGNOSIS — L97821 Non-pressure chronic ulcer of other part of left lower leg limited to breakdown of skin: Secondary | ICD-10-CM | POA: Diagnosis not present

## 2014-06-04 ENCOUNTER — Ambulatory Visit (HOSPITAL_COMMUNITY): Payer: Medicare Other

## 2014-06-04 ENCOUNTER — Encounter (HOSPITAL_COMMUNITY): Payer: Self-pay

## 2014-06-04 MED ORDER — TECHNETIUM TC 99M SESTAMIBI GENERIC - CARDIOLITE
30.0000 | Freq: Once | INTRAVENOUS | Status: AC | PRN
  Administered 2014-06-04: 30 via INTRAVENOUS

## 2014-06-05 ENCOUNTER — Encounter: Payer: Self-pay | Admitting: Vascular Surgery

## 2014-06-06 ENCOUNTER — Encounter: Payer: Self-pay | Admitting: Vascular Surgery

## 2014-06-06 ENCOUNTER — Ambulatory Visit (HOSPITAL_COMMUNITY)
Admission: RE | Admit: 2014-06-06 | Discharge: 2014-06-06 | Disposition: A | Discharge status: Home / Self Care | Payer: Medicare Other | Source: Ambulatory Visit | Attending: Vascular Surgery | Admitting: Vascular Surgery

## 2014-06-06 ENCOUNTER — Ambulatory Visit (INDEPENDENT_AMBULATORY_CARE_PROVIDER_SITE_OTHER): Payer: Medicare Other | Admitting: Vascular Surgery

## 2014-06-06 VITALS — BP 120/80 | HR 87 | Ht 73.0 in | Wt 363.0 lb

## 2014-06-06 DIAGNOSIS — I83023 Varicose veins of left lower extremity with ulcer of ankle: Secondary | ICD-10-CM

## 2014-06-06 DIAGNOSIS — I83025 Varicose veins of left lower extremity with ulcer other part of foot: Secondary | ICD-10-CM

## 2014-06-06 DIAGNOSIS — I255 Ischemic cardiomyopathy: Secondary | ICD-10-CM

## 2014-06-06 DIAGNOSIS — I70299 Other atherosclerosis of native arteries of extremities, unspecified extremity: Secondary | ICD-10-CM

## 2014-06-06 DIAGNOSIS — I83029 Varicose veins of left lower extremity with ulcer of unspecified site: Secondary | ICD-10-CM

## 2014-06-06 DIAGNOSIS — I10 Essential (primary) hypertension: Secondary | ICD-10-CM | POA: Insufficient documentation

## 2014-06-06 DIAGNOSIS — I83022 Varicose veins of left lower extremity with ulcer of calf: Secondary | ICD-10-CM | POA: Diagnosis not present

## 2014-06-06 DIAGNOSIS — I87312 Chronic venous hypertension (idiopathic) with ulcer of left lower extremity: Secondary | ICD-10-CM | POA: Insufficient documentation

## 2014-06-06 DIAGNOSIS — E785 Hyperlipidemia, unspecified: Secondary | ICD-10-CM | POA: Diagnosis not present

## 2014-06-06 DIAGNOSIS — I83021 Varicose veins of left lower extremity with ulcer of thigh: Secondary | ICD-10-CM | POA: Diagnosis not present

## 2014-06-06 DIAGNOSIS — L97929 Non-pressure chronic ulcer of unspecified part of left lower leg with unspecified severity: Secondary | ICD-10-CM | POA: Diagnosis not present

## 2014-06-06 DIAGNOSIS — L97909 Non-pressure chronic ulcer of unspecified part of unspecified lower leg with unspecified severity: Secondary | ICD-10-CM | POA: Insufficient documentation

## 2014-06-06 DIAGNOSIS — I83024 Varicose veins of left lower extremity with ulcer of heel and midfoot: Secondary | ICD-10-CM

## 2014-06-06 DIAGNOSIS — Z87891 Personal history of nicotine dependence: Secondary | ICD-10-CM | POA: Insufficient documentation

## 2014-06-06 DIAGNOSIS — I83028 Varicose veins of left lower extremity with ulcer other part of lower leg: Secondary | ICD-10-CM

## 2014-06-06 NOTE — Progress Notes (Signed)
Referred by:  Lin Landsman, MD [phone], [place] [address]  Reason for referral: Left leg wound  History of Present Illness  Larry [name] is a 65 y.o. (06/25/1950) male who presents with chief complaint: left lower leg wound that has been present for three weeks. The wound is located on the lateral aspect of his left lower leg, above the lateral malleolus. He states that this wound has progressively worsened and is painful. He denies any other non-healing wounds. He denies any pain in his legs or feet. He is currently being treated at the wound care center at [facility]. The patient reports onset of leg swelling about 6 years ago following his heart stents. The patient is not diabetic. He is able to ambulate without assistance but does not ambulate much by choice. He lives at home with his wife. He denies any history of claudication (he does not ambulate enough to elicit claudication however) or rest pain.   He has a complex past medical history including ischemic cardiomyopathy with severe three vessel disease s/p stents to the RCA in 2010 by Dr. Angelena Form, combined systolic and diastolic HF, colon cancer s/p partial colectomy with colostomy (he is now obtaining pre-op clearance for colostomy reversal), atrial tachycardia, history of cardiac arrest, obstructive sleep apnea, now on CPAP, CKD, hypertension and hyperlipidemia. He is a former smoker.   Past Medical History  Diagnosis Date  . Hypertension   . Hypercholesteremia   . CHF (congestive heart failure)     has diastolic heart failure grade 1; EF is 45 to 50% per echo 05/2011  . Coronary artery disease   . COPD (chronic obstructive pulmonary disease)   . Asthma   . Obesity   . Noncompliance   . NSVT (nonsustained ventricular tachycardia)     beta blocker restarted  . Atrial tachycardia     managed on beta blocker therapy  . Colon cancer   . Peripheral arterial disease     small ulcer the head of  right metatarsal plantar surface right foot  . Sleep apnea     cpap used nightly  . Cardiac arrest     X 2 episodes during hospital visit 12'14"electrolyte imbalance"- "Shocked"  . S/P colostomy     2014    Past Surgical History  Procedure Laterality Date  . Coronary stent placement      x2  . Cardiac catheterization  11/12/2008    stent x 2 to RCA  . Colonoscopy N/A 02/08/2013    Procedure: COLONOSCOPY;  Surgeon: [name], MD;  Location: [place];  Service: Endoscopy;  Laterality: N/A;  . Esophagogastroduodenoscopy N/A 02/08/2013    Procedure: ESOPHAGOGASTRODUODENOSCOPY (EGD);  Surgeon: [name], MD;  Location: [facility] ENDOSCOPY;  Service: Endoscopy;  Laterality: N/A;  . Laparotomy N/A 02/12/2013    Procedure: EXPLORATORY LAPAROTOMY PARTIAL COLECTOMY WITH COLOSTOMY;  Surgeon: [name], MD;  Location: [place];  Service: General;  Laterality: N/A;  . Laparotomy N/A 02/18/2013    Procedure: EXPLORATORY LAPAROTOMY/Closure of Wound;  Surgeon: [name], MD;  Location: [place];  Service: General;  Laterality: N/A;  . Colonoscopy with propofol N/A 05/09/2014    Procedure: COLONOSCOPY WITH PROPOFOL;  Surgeon: [name], MD;  Location: [facility] ENDOSCOPY;  Service: Endoscopy;  Laterality: N/A;    History   Social History  . Marital Status: Married    Spouse Name: N/A  . Number of Children: N/A  . Years of Education:  N/A   Occupational History  . Not on file.   Social History Main Topics  . Smoking status: Former Smoker    Types: Cigarettes    Quit date: 07/15/2008  . Smokeless tobacco: Never Used  . Alcohol Use: No     Comment: not since New Year's  . Drug Use: No  . Sexual Activity: No   Other Topics Concern  . Not on file   Social History Narrative    Family History  Problem Relation Age of Onset  . Hypertension Father   . Heart disease Father     before age 51  . Diabetes Father   . Hypertension Mother     Current Outpatient Prescriptions on File  Prior to Visit  Medication Sig Dispense Refill  . aspirin EC 81 MG tablet Take 1 tablet (81 mg total) by mouth daily. 30 tablet 0  . atorvastatin (LIPITOR) 40 MG tablet Take 1 tablet (40 mg total) by mouth daily. (Patient taking differently: Take 40 mg by mouth daily at 6 PM. ) 90 tablet 3  . carvedilol (COREG) 12.5 MG tablet Take 1 tablet (12.5 mg total) by mouth 2 (two) times daily. 180 tablet 3  . ferrous sulfate 325 (65 FE) MG tablet Take 1 tablet (325 mg total) by mouth daily with breakfast. 30 tablet 1  . furosemide (LASIX) 40 MG tablet TAKE 1 TABLET BY MOUTH EVERY DAY 30 tablet 0  . KLOR-CON M20 20 MEQ tablet TAKE 1 TABLET TWICE DAILY 60 tablet 3  . levalbuterol (XOPENEX HFA) 45 MCG/ACT inhaler Inhale 2 puffs into the lungs every 4 (four) hours as needed for wheezing. 1 Inhaler 11  . Magnesium 500 MG CAPS Take 500 mg by mouth daily.    . Multiple Vitamins-Minerals (MULTIVITAMIN WITH MINERALS) tablet Take 1 tablet by mouth daily.    . nitroGLYCERIN (NITROSTAT) 0.4 MG SL tablet Place 0.4 mg under the tongue every 5 (five) minutes as needed for chest pain.    . pantoprazole (PROTONIX) 40 MG tablet TAKE 1 TABLET (40 MG TOTAL) BY MOUTH AT BEDTIME. 30 tablet 0  . ramipril (ALTACE) 1.25 MG capsule TAKE ONE CAPSULE BY MOUTH EVERY 12 HOURS 60 capsule 3  . simethicone (GAS-X) 80 MG chewable tablet Chew 1 tablet (80 mg total) by mouth 4 (four) times daily. 120 tablet 0  . spironolactone (ALDACTONE) 25 MG tablet TAKE 1 TABLET (25 MG TOTAL) BY MOUTH DAILY. 30 tablet 0   No current facility-administered medications on file prior to visit.    No Known Allergies  REVIEW OF SYSTEMS:  (Positives checked otherwise negative)  CARDIOVASCULAR:  []  chest pain, []  chest pressure, []  palpitations, []  shortness of breath when laying flat, [x]  shortness of breath with exertion,  []  pain in feet when walking, []  pain in feet when laying flat, []  history of blood clot in veins (DVT), []  history of phlebitis, [x]   swelling in legs, []  varicose veins  PULMONARY:  []  productive cough, []  asthma, []  wheezing  NEUROLOGIC:  []  weakness in arms or legs, []  numbness in arms or legs, []  difficulty speaking or slurred speech, []  temporary loss of vision in one eye, []  dizziness  HEMATOLOGIC:  []  bleeding problems, []  problems with blood clotting too easily  MUSCULOSKEL:  []  joint pain, []  joint swelling  GASTROINTEST:  []  vomiting blood, []  blood in stool     GENITOURINARY:  []  burning with urination, []  blood in urine  PSYCHIATRIC:  []  history of major depression  INTEGUMENTARY:  []  rashes, [x]  ulcers  CONSTITUTIONAL:  []  fever, []  chills   Physical Examination Filed Vitals:   06/06/14 1459  BP: 120/80  Pulse: 87  Height: 6\' 1"  (1.854 m)  Weight: 363 lb (164.656 kg)  SpO2: 97%   Body mass index is 47.9 kg/(m^2).  General: A&O x 3, WD morbidly obese male in NAD  Head: [place]/AT  Neck: Supple, no nuchal rigidity  Pulmonary: Sym exp, good air movt, CTAB, no rales, rhonchi, & wheezing  Cardiac: Distant heart sounds due to habitus, RRR, Nl S1, S2, no Murmurs, rubs or gallops, no carotid bruits.   Vascular: Pulse exam difficult due to habitus and peripheral swelling Vessel Right Left  Radial Palpable Palpable  Aorta Not palpable N/A  Femoral Not palpable Not palpable  Popliteal Not palpable Not palpable  PT Not palpable Not palpable  DP Not palpable Not palpable  Faintly palpable pulses distal to dorsalis pedis arteries bilaterally, confirmed with monophasic doppler signals.  Gastrointestinal: soft, non tender, obese, colostomy to right abdomen, midline abdominal wound with some dry eschar, otherwise healed.   Musculoskeletal: M/S 5/5 throughout. Significant edema to lower legs bilaterally. Extremities without ischemic changes. Large superificial ulcer to left lateral leg above lateral malleolus consistent with venous stasis ulcer. No cellulitis, no drainage, no odor. Venous stasis changes  to lower legs bilaterally. No wounds on feet.   Neurologic: CN 2-12 intact. Pain and light touch intact in extremities.  Motor exam as listed above  Psychiatric: Judgment intact, Mood & affect appropriate for pt's clinical situation  Dermatologic: See M/S exam for extremity exam, no rashes otherwise noted  Non-Invasive Vascular Imaging  BLE Venous Insufficiency Duplex (Date: 06/06/2014):  Exam was technically difficult due to body habitus  LLE: negative DVT and SVT, negative GSV reflux, reflux in SSV,  negative deep venous reflux  ABIs (Date 06/06/2014) Exam was technically difficult due to body habitus.   RLE: 0.93, monophasic waveforms throughout, TBI: 0.19  LLE: 1.37, monophasic DP, biphasic PT, TBI: 0.33  Outside Studies/Documentation 5 pages of outside documents were reviewed including: medical records from [facility] .  Medical Decision Making  Larry [name] is a 64 y.o. male who presents with: large superficial ulcer to left lateral lower leg. The patient's duplex studies today were technically difficult due to the patient's body habitus and may not be completely accurate. His ABIs are normal and he has faintly palpable pedal pulses. Suspect that he does have some arterial insufficiency, but that it is adequate to heal his wound. He does not have any significant venous reflux on duplex studies, so suspect that his swelling is likely secondary to central cardiac and pulmonary dysfunction. Continue local wound care and recommend compression. We will follow up in one month to evaluate his wound. If this progresses, will consider performing arteriogram for further evaluation.   Virgina Jock, PA-C [facility] Office: [phone] Pager: [phone]  06/06/2014, 3:31 PM  This patient was seen and examined in conjunction with Dr. Bridgett Larsson  Addendum  I have independently interviewed and examined the patient, and I agree with  the physician assistant's findings.  I can faintly feel DP pulses in this patient (L>R).  His ulcer appears consistent with venous in etiology but he only has L SSV reflux with intact L GSV and deep venous system.  Due to habitus, the tech noted these studies were difficult which might make their accuracy limited.  Similarly there were difficulties with  the ABI.  Morphology of the L PT is consistent with a biphasic signal, suggesting that while he has PAD in that LLE, he should have enough perfusion to heal the L lateral ulcer with compressive therapy.  I recommend one month of wound care before considering invasive diagnostics with an angiogram.  Adele Barthel, MD [facility] Office: [phone] Pager: [phone]  06/06/2014, 4:47 PM

## 2014-06-09 ENCOUNTER — Other Ambulatory Visit: Payer: Self-pay | Admitting: Nurse Practitioner

## 2014-06-09 ENCOUNTER — Encounter: Payer: Self-pay | Admitting: Nurse Practitioner

## 2014-06-09 ENCOUNTER — Ambulatory Visit (INDEPENDENT_AMBULATORY_CARE_PROVIDER_SITE_OTHER): Payer: Medicare Other | Admitting: Nurse Practitioner

## 2014-06-09 VITALS — BP 108/78 | HR 97 | Resp 20 | Ht 70.0 in | Wt 366.0 lb

## 2014-06-09 DIAGNOSIS — I255 Ischemic cardiomyopathy: Secondary | ICD-10-CM

## 2014-06-09 DIAGNOSIS — I5022 Chronic systolic (congestive) heart failure: Secondary | ICD-10-CM

## 2014-06-09 DIAGNOSIS — I259 Chronic ischemic heart disease, unspecified: Secondary | ICD-10-CM

## 2014-06-09 DIAGNOSIS — Z01810 Encounter for preprocedural cardiovascular examination: Secondary | ICD-10-CM

## 2014-06-09 LAB — CBC
HCT: 38.3 % — ABNORMAL LOW (ref 39.0–52.0)
Hemoglobin: 12.5 g/dL — ABNORMAL LOW (ref 13.0–17.0)
MCHC: 32.7 g/dL (ref 30.0–36.0)
MCV: 85.4 fl (ref 78.0–100.0)
Platelets: 143 10*3/uL — ABNORMAL LOW (ref 150.0–400.0)
RBC: 4.48 Mil/uL (ref 4.22–5.81)
RDW: 15.4 % (ref 11.5–15.5)
WBC: 5.9 10*3/uL (ref 4.0–10.5)

## 2014-06-09 LAB — BASIC METABOLIC PANEL
BUN: 21 mg/dL (ref 6–23)
CO2: 26 mEq/L (ref 19–32)
Calcium: 10.2 mg/dL (ref 8.4–10.5)
Chloride: 102 mEq/L (ref 96–112)
Creatinine, Ser: 1.77 mg/dL — ABNORMAL HIGH (ref 0.40–1.50)
GFR: 50.5 mL/min — ABNORMAL LOW (ref >60.00)
Glucose, Bld: 96 mg/dL (ref 70–99)
Potassium: 4.1 mEq/L (ref 3.5–5.1)
Sodium: 137 mEq/L (ref 135–145)

## 2014-06-09 LAB — PROTIME-INR
INR: 1 ratio (ref 0.8–1.0)
Prothrombin Time: 11.3 s (ref 9.6–13.1)

## 2014-06-09 LAB — APTT: aPTT: 30.5 s (ref 23.4–32.7)

## 2014-06-09 NOTE — Patient Instructions (Addendum)
We will be checking the following labs today BMET, CBC, PT, PTT  Stay on your current medicines   Your provider has recommended a cardiac catherization  You are scheduled for a cardiac catheterization on Wednesday, April 20th at 8:30 AM with Dr. Burt Knack or associate.  Go to [facility] 2nd Floor Short Stay on Wednesday, April 20th at 6:30 AM.  Enter thru the [facility] entrance A No food or drink after midnight on Tuesday. You may take your medications with a sip of water on the day of your procedure.   Coronary Angiogram A coronary angiogram, also called coronary angiography, is an X-ray procedure used to look at the arteries in the heart. In this procedure, a dye (contrast dye) is injected through a long, hollow tube (catheter). The catheter is about the size of a piece of cooked spaghetti and is inserted through your groin, wrist, or arm. The dye is injected into each artery, and X-rays are then taken to show if there is a blockage in the arteries of your heart.  LET [facility] CARE PROVIDER KNOW ABOUT:  Any allergies you have, including allergies to shellfish or contrast dye.   All medicines you are taking, including vitamins, herbs, eye drops, creams, and over-the-counter medicines.   Previous problems you or members of your family have had with the use of anesthetics.   Any blood disorders you have.   Previous surgeries you have had.  History of kidney problems or failure.   Other medical conditions you have.  RISKS AND COMPLICATIONS  Generally, a coronary angiogram is a safe procedure. However, about 1 person out of 1000 can have problems that may include:  Allergic reaction to the dye.  Bleeding/bruising from the access site or other locations.  Kidney injury, especially in people with impaired kidney function.  Stroke (rare).  Heart attack (rare).  Irregular rhythms (rare)  Death (rare)  BEFORE THE PROCEDURE   Do not eat or drink anything after  midnight the night before the procedure or as directed by your health care provider.   Ask your health care provider about changing or stopping your regular medicines. This is especially important if you are taking diabetes medicines or blood thinners.  PROCEDURE  You may be given a medicine to help you relax (sedative) before the procedure. This medicine is given through an intravenous (IV) access tube that is inserted into one of your veins.   The area where the catheter will be inserted will be washed and shaved. This is usually done in the groin but may be done in the fold of your arm (near your elbow) or in the wrist.   A medicine will be given to numb the area where the catheter will be inserted (local anesthetic).   The health care provider will insert the catheter into an artery. The catheter will be guided by using a special type of X-ray (fluoroscopy) of the blood vessel being examined.   A special dye will then be injected into the catheter, and X-rays will be taken. The dye will help to show where any narrowing or blockages are located in the heart arteries.    AFTER THE PROCEDURE   If the procedure is done through the leg, you will be kept in bed lying flat for several hours. You will be instructed to not bend or cross your legs.  The insertion site will be checked frequently.   The pulse in your feet or wrist will be checked frequently.  Additional blood tests, X-rays, and an electrocardiogram may be done.

## 2014-06-09 NOTE — Progress Notes (Signed)
CARDIOLOGY OFFICE NOTE  Date:  06/09/2014    Larry [name] Date of Birth: 01-Feb-1951 Medical Record #[id number]  PCP:  Kristine Garbe, MD  Cardiologist:  Allred    Chief Complaint  Patient presents with  . Pre-op Exam    Pre op visit/after Myoview - seen for Dr. Rayann Heman.     History of Present Illness: Larry [name] is a 64 y.o. male who presents today for a pre op/follow up after Myoview visit. Seen for Dr. Rayann Heman. He has an ischemic CM with severe diffuse 3VD and prior stents to the RCA in 2010 per Dr. Angelena Form, systolic and diastolic dysfunction, HTN, HLD and obesity. Has had a history of atrial tach that has been treated with beta blocker. Was hospitalized back in April of 2013 with a HF exacerbation - had stopped his medicines. Last EF of 40 - to 12% with diastolic dysfunction as well noted in January of 2015.   Seen by me back in October of 2014 - Was out of his medicines again. Had lost weight and was anemic - was sending to GI and ended up having colon cancer.   He was admitted (02/02/2013 - 03/01/2013) to [facility] with subacute history of progressive weakness, constipation and difficulty eating and drinking. He presented with acute renal failure (Creatinine 7.83) and severe anemia (Hbg of 7.3) as well as obstruction. Colonoscopy doing this admission with biopsy revealed adenocarcinoma and patient underwent exploratory lap with partial colectomy with colostomy on 12/23 by Dr. Hulen Skains. Recovery was complicated by wound dehissance, requiring a return to the OR 12/29 with abdominal wound closure with VAC placment. After the procedure, the patient developed atrial tachycardia, so a dilt drip was started (12/29-1/2). On 1/4, the patient had a cardiac arrest, requiring defibrillation, and was subsequently transferred to the ICU. QT was found to be > 700, with mild hypokalemia and hypomagnesemia implicated as the cause. Lidocaine drip was started. Electrolyte  abnormality treated and 2D-echo revealed an EF of 40-45% with periapical akinesis and biatrial enlargement. He experienced recurrent torsades on 01/05 and shocked in NSR. By 1/6, QTc had decreased to 499, lidocaine drip was d/c'ed, and patient was transferred out of ICU to stepdown. Then admitted to physical medicine and rehabilitation on 01/09 due to severe deconditioning. He was continued on protein supplement to promote wound healing. His abdominal wound was treated with VAC through 03/05/13. His heart rate was controlled without recurrent arrythmia and was maintained on potassium and magnesium supplements. He was discharged to home on 03/07/2013. He was last evaluated by Dr. Hulen Skains on 02/03 with review of his pathology demonstrating invasive adenocarcinoma of the transverse colon with penetration into the pericolonic fat. He had 0/18 lymph nodes negative for metastatic disease. He was pathologic stage [id number].   Saw Dr. Gwenlyn Found in early July of 2015 for a foot ulcer - basically normal ABI's done.   He was readmitted to the hospital in August of 2015 - triggered by too much salt. Presented with respiratory distress - treated with Bipap and quickly stabilized. Was diuresed about 6 liters as well. Started on low dose ACE. Sleep study recommended.  I have seen back here several times since that hospitalization - last in December - he had improved. Titrated as much medicine as BP could tolerate. Felt to be stable from our standpoint. Referred to Sleep Medicine/Dr. Annamaria Boots and had CPAP for his OSA.   Last seen in March - wanting to get his colostomy reversed and  needing pre op clearance. He was feeling pretty good - no real cardiac symptoms noted. Does have a superficial leg ulcer and is being treated by the wound clinic and has seen Dr. Bridgett Larsson for evaluation as well.   Comes in today. Here alone. He continues to do ok. No chest pain. Not short of breath. Rare "fluttering" noted. Not dizzy or lightheaded. Feels  like his leg ulcer is improving. Remains fairly sedentary. Not aware of his stress test results.   Past Medical History  Diagnosis Date  . Hypertension   . Hypercholesteremia   . CHF (congestive heart failure)     has diastolic heart failure grade 1; EF is 45 to 50% per echo 05/2011  . Coronary artery disease   . COPD (chronic obstructive pulmonary disease)   . Asthma   . Obesity   . Noncompliance   . NSVT (nonsustained ventricular tachycardia)     beta blocker restarted  . Atrial tachycardia     managed on beta blocker therapy  . Colon cancer   . Peripheral arterial disease     small ulcer the head of right metatarsal plantar surface right foot  . Sleep apnea     cpap used nightly  . Cardiac arrest     X 2 episodes during hospital visit 12'14"electrolyte imbalance"- "Shocked"  . S/P colostomy     2014    Past Surgical History  Procedure Laterality Date  . Coronary stent placement      x2  . Cardiac catheterization  11/12/2008    stent x 2 to RCA  . Colonoscopy N/A 02/08/2013    Procedure: COLONOSCOPY;  Surgeon: [name], MD;  Location: [place];  Service: Endoscopy;  Laterality: N/A;  . Esophagogastroduodenoscopy N/A 02/08/2013    Procedure: ESOPHAGOGASTRODUODENOSCOPY (EGD);  Surgeon: [name], MD;  Location: [facility] ENDOSCOPY;  Service: Endoscopy;  Laterality: N/A;  . Laparotomy N/A 02/12/2013    Procedure: EXPLORATORY LAPAROTOMY PARTIAL COLECTOMY WITH COLOSTOMY;  Surgeon: [name], MD;  Location: [place];  Service: General;  Laterality: N/A;  . Laparotomy N/A 02/18/2013    Procedure: EXPLORATORY LAPAROTOMY/Closure of Wound;  Surgeon: [name], MD;  Location: [place];  Service: General;  Laterality: N/A;  . Colonoscopy with propofol N/A 05/09/2014    Procedure: COLONOSCOPY WITH PROPOFOL;  Surgeon: [name], MD;  Location: [facility] ENDOSCOPY;  Service: Endoscopy;  Laterality: N/A;     Medications: Current Outpatient Prescriptions  Medication Sig  Dispense Refill  . aspirin EC 81 MG tablet Take 1 tablet (81 mg total) by mouth daily. 30 tablet 0  . atorvastatin (LIPITOR) 40 MG tablet Take 1 tablet (40 mg total) by mouth daily. (Patient taking differently: Take 40 mg by mouth daily at 6 PM. ) 90 tablet 3  . carvedilol (COREG) 12.5 MG tablet Take 1 tablet (12.5 mg total) by mouth 2 (two) times daily. 180 tablet 3  . cephALEXin (KEFLEX) 500 MG capsule     . ferrous sulfate 325 (65 FE) MG tablet Take 1 tablet (325 mg total) by mouth daily with breakfast. 30 tablet 1  . furosemide (LASIX) 40 MG tablet TAKE 1 TABLET BY MOUTH EVERY DAY 30 tablet 0  . KLOR-CON M20 20 MEQ tablet TAKE 1 TABLET TWICE DAILY 60 tablet 3  . levalbuterol (XOPENEX HFA) 45 MCG/ACT inhaler Inhale 2 puffs into the lungs every 4 (four) hours as needed for wheezing. 1 Inhaler 11  . Magnesium 500 MG CAPS Take 500 mg by mouth  daily.    . Multiple Vitamins-Minerals (MULTIVITAMIN WITH MINERALS) tablet Take 1 tablet by mouth daily.    . nitroGLYCERIN (NITROSTAT) 0.4 MG SL tablet Place 0.4 mg under the tongue every 5 (five) minutes as needed for chest pain.    . pantoprazole (PROTONIX) 40 MG tablet TAKE 1 TABLET (40 MG TOTAL) BY MOUTH AT BEDTIME. 30 tablet 0  . ramipril (ALTACE) 1.25 MG capsule TAKE ONE CAPSULE BY MOUTH EVERY 12 HOURS 60 capsule 3  . simethicone (GAS-X) 80 MG chewable tablet Chew 1 tablet (80 mg total) by mouth 4 (four) times daily. 120 tablet 0  . spironolactone (ALDACTONE) 25 MG tablet TAKE 1 TABLET (25 MG TOTAL) BY MOUTH DAILY. 30 tablet 0  . MOVIPREP 100 G SOLR      No current facility-administered medications for this visit.    Allergies: No Known Allergies  Social History: The patient  reports that he quit smoking about 5 years ago. His smoking use included Cigarettes. He has never used smokeless tobacco. He reports that he does not drink alcohol or use illicit drugs.   Family History: The patient's family history includes Diabetes in his father; Heart  disease in his father; Hypertension in his father and mother.   Review of Systems: Please see the history of present illness.   Otherwise, the review of systems is positive for leg swelling.   All other systems are reviewed and negative.   Physical Exam: VS:  BP 108/78 mmHg  Pulse 97  Resp 20  Ht 5\' 10"  (1.778 m)  Wt 366 lb (166.017 kg)  BMI 52.52 kg/m2  SpO2 95% .  BMI Body mass index is 52.52 kg/(m^2).  Wt Readings from Last 3 Encounters:  06/09/14 366 lb (166.017 kg)  06/06/14 363 lb (164.656 kg)  05/29/14 363 lb (164.656 kg)    General: Pleasant. He is an obese black male in no acute distress.  HEENT: Normal. Neck: Supple, no JVD, carotid bruits, or masses noted.  Cardiac: Regular rate and rhythm. Heart tones distant. Chronic lower extremity edema.  Respiratory:  Lungs are clear to auscultation bilaterally with normal work of breathing. Decreased breath sounds.  GI: Soft and nontender. Abdomen is obese.  MS: No deformity or atrophy. Gait and ROM intact. Skin: Warm and dry. Color is normal.  Neuro:  Strength and sensation are intact and no gross focal deficits noted.  Psych: Alert, appropriate and with normal affect.   LABORATORY DATA:  EKG:  EKG is not ordered today.   Lab Results  Component Value Date   WBC 6.7 05/12/2014   HGB 13.1 05/12/2014   HCT 41.1 05/12/2014   PLT 175 05/12/2014   GLUCOSE 93 05/12/2014   CHOL 158 10/02/2013   TRIG 117 10/02/2013   HDL 67 10/02/2013   LDLCALC 68 10/02/2013   ALT 19 05/12/2014   AST 17 05/12/2014   NA 137 05/12/2014   K 4.6 05/12/2014   CL 103 05/12/2014   CREATININE 1.96* 05/12/2014   BUN 21 05/12/2014   CO2 26 05/12/2014   TSH 0.70 12/06/2012   INR 1.00 09/30/2013   HGBA1C 6.0 11/26/2009    BNP (last 3 results) No results for input(s): BNP in the last 8760 hours.  ProBNP (last 3 results)  Recent Labs  09/30/13 0736 10/15/13 1117 01/31/14 1012  PROBNP 397.2* 9.0 7.0     Other Studies Reviewed  Today:   Myoview Impression April 2016 Exercise Capacity: Lexiscan with no exercise. BP Response: Normal blood pressure  response. Clinical Symptoms: There is dyspnea. ECG Impression: No significant ST segment change suggestive of ischemia. Comparison with Prior Nuclear Study: No images to compare  Overall Impression: High risk stress nuclear study large inferior wall infarct from apex to base. Also suggestion of apical, anterior and lateral wall ischemia.  LV Ejection Fraction: 41%. LV Wall Motion: Inferiior akinesis Consistent with possible 3 VD Consider cath before major surgery   [place]   Echo Study Conclusions from January 2015  - Left ventricle: The cavity size was mildly dilated. Wall thickness was increased in a pattern of mild LVH. Systolic function was mildly to moderately reduced. The estimated ejection fraction was in the range of 40% to 45%. There was akinesis of the apical lateral, apical anterior, and apical septal segments. There was akinesis of the true apex. Prominent trabeculation at the apex but I do not think thrombus was present. Patient had Definity study recently to look at the apex and no thrombus was seen. Doppler parameters are consistent with abnormal left ventricular relaxation (grade 1 diastolic dysfunction). - Aortic valve: Trileaflet; moderately calcified leaflets. There was no stenosis. - Mitral valve: Moderately calcified annulus. Mildly calcified leaflets . Trivial regurgitation. - Left atrium: The atrium was moderately dilated. - Right ventricle: The cavity size was normal. Systolic function was normal. - Right atrium: The atrium was mildly dilated. - Pulmonary arteries: No complete TR doppler jet so unable to estimate PA systolic pressure. - Systemic veins: IVC was not visualized. - Pericardium, extracardiac: A trivial pericardial effusion was identified. Impressions:  - Mildly dilated LV with mild LV hypertrophy. EF  40-45% with peri-apical akinesis. I do not think that LV apical thrombus is present. Normal RV size and systolic function. Biatrial enlargement.   Assessment / Plan:  1. Pre op clearance - lots of cardiac issues - now with high risk Myoview - reviewed with patient - will proceed on with cardiac cath - scheduled for Wednesday with Dr. Burt Knack. The patient understands that risks include but are not limited to stroke (1 in 1000), death (1 in 47), kidney failure [usually temporary] (1 in 500), bleeding (1 in 200), allergic reaction [possibly serious] (1 in 200), and agrees to proceed.   2. Combined systolic and diastolic HF - he has chronic issues with his volume.   3. CAD with prior PCI - no chest pain.   4. Colon cancer - followed by oncology - hopeful to have his colostomy reversed.   5. HTN - stable - BP limiting how much HF meds he can have.   5. CKD - on very low dose ACE   6. OSA - now on CPAP and followed by Dr. Annamaria Boots  7. HLD - on statin  8. Atrial tachycardia - beta blocker increased at last visit.    Current medicines are reviewed with the patient today.  The patient does not have concerns regarding medicines other than what has been noted above.  The following changes have been made:  See above.  Labs/ tests ordered today include:    Orders Placed This Encounter  Procedures  . Basic metabolic panel  . CBC  . Protime-INR  . APTT     Disposition:   Further disposition to follow.   Patient is agreeable to this plan and will call if any problems develop in the interim.   Signed: Burtis Junes, RN, ANP-C 06/09/2014 10:48 AM  [facility] [address] [place], [place]  [address] Phone: [phone] Fax: [phone]

## 2014-06-10 DIAGNOSIS — L97821 Non-pressure chronic ulcer of other part of left lower leg limited to breakdown of skin: Secondary | ICD-10-CM | POA: Diagnosis not present

## 2014-06-10 DIAGNOSIS — I87312 Chronic venous hypertension (idiopathic) with ulcer of left lower extremity: Secondary | ICD-10-CM | POA: Diagnosis not present

## 2014-06-11 ENCOUNTER — Ambulatory Visit (HOSPITAL_COMMUNITY)
Admission: RE | Admit: 2014-06-11 | Discharge: 2014-06-12 | Disposition: A | Discharge status: Home / Self Care | Payer: Medicare Other | Source: Ambulatory Visit | Attending: Cardiovascular Disease | Admitting: Cardiovascular Disease

## 2014-06-11 ENCOUNTER — Encounter (HOSPITAL_COMMUNITY)
Admission: RE | Disposition: A | Discharge status: Home / Self Care | Payer: Medicare Other | Source: Ambulatory Visit | Attending: Cardiovascular Disease

## 2014-06-11 ENCOUNTER — Encounter (HOSPITAL_COMMUNITY): Payer: Self-pay | Admitting: General Practice

## 2014-06-11 DIAGNOSIS — Z9049 Acquired absence of other specified parts of digestive tract: Secondary | ICD-10-CM | POA: Insufficient documentation

## 2014-06-11 DIAGNOSIS — I471 Supraventricular tachycardia: Secondary | ICD-10-CM | POA: Diagnosis not present

## 2014-06-11 DIAGNOSIS — N183 Chronic kidney disease, stage 3 unspecified: Secondary | ICD-10-CM | POA: Diagnosis present

## 2014-06-11 DIAGNOSIS — Z85038 Personal history of other malignant neoplasm of large intestine: Secondary | ICD-10-CM | POA: Insufficient documentation

## 2014-06-11 DIAGNOSIS — D696 Thrombocytopenia, unspecified: Secondary | ICD-10-CM | POA: Diagnosis not present

## 2014-06-11 DIAGNOSIS — I209 Angina pectoris, unspecified: Secondary | ICD-10-CM | POA: Diagnosis present

## 2014-06-11 DIAGNOSIS — Z7902 Long term (current) use of antithrombotics/antiplatelets: Secondary | ICD-10-CM | POA: Insufficient documentation

## 2014-06-11 DIAGNOSIS — Z7982 Long term (current) use of aspirin: Secondary | ICD-10-CM | POA: Insufficient documentation

## 2014-06-11 DIAGNOSIS — I255 Ischemic cardiomyopathy: Secondary | ICD-10-CM | POA: Diagnosis not present

## 2014-06-11 DIAGNOSIS — I2510 Atherosclerotic heart disease of native coronary artery without angina pectoris: Secondary | ICD-10-CM | POA: Diagnosis not present

## 2014-06-11 DIAGNOSIS — R9439 Abnormal result of other cardiovascular function study: Secondary | ICD-10-CM | POA: Diagnosis present

## 2014-06-11 HISTORY — DX: Dependence on other enabling machines and devices: Z99.89

## 2014-06-11 HISTORY — PX: CORONARY ANGIOPLASTY WITH STENT PLACEMENT: SHX49

## 2014-06-11 HISTORY — DX: Thrombocytopenia, unspecified: D69.6

## 2014-06-11 HISTORY — DX: Chronic kidney disease, stage 3 (moderate): N18.3

## 2014-06-11 HISTORY — DX: Unspecified asthma, uncomplicated: J45.909

## 2014-06-11 HISTORY — DX: Obstructive sleep apnea (adult) (pediatric): G47.33

## 2014-06-11 HISTORY — DX: Chronic kidney disease, stage 3 unspecified: N18.30

## 2014-06-11 HISTORY — PX: LEFT HEART CATHETERIZATION WITH CORONARY ANGIOGRAM: SHX5451

## 2014-06-11 LAB — POCT ACTIVATED CLOTTING TIME: Activated Clotting Time: 491 seconds

## 2014-06-11 LAB — BASIC METABOLIC PANEL
Anion gap: 11 (ref 5–15)
BUN: 18 mg/dL (ref 6–23)
CO2: 24 mmol/L (ref 19–32)
Calcium: 9.7 mg/dL (ref 8.4–10.5)
Chloride: 104 mmol/L (ref 96–112)
Creatinine, Ser: 1.77 mg/dL — ABNORMAL HIGH (ref 0.50–1.35)
GFR calc Af Amer: 46 mL/min — ABNORMAL LOW (ref >90)
GFR calc non Af Amer: 40 mL/min — ABNORMAL LOW (ref >90)
GLUCOSE: 101 mg/dL — AB (ref 70–99)
POTASSIUM: 4.2 mmol/L (ref 3.5–5.1)
Sodium: 139 mmol/L (ref 135–145)

## 2014-06-11 SURGERY — LEFT HEART CATHETERIZATION WITH CORONARY ANGIOGRAM
Anesthesia: LOCAL

## 2014-06-11 MED ORDER — ASPIRIN 81 MG PO CHEW: 81.0000 mg | CHEWABLE_TABLET | ORAL | Status: DC

## 2014-06-11 MED ORDER — ANGIOPLASTY BOOK
Freq: Once | Status: AC
  Administered 2014-06-11: 22:00:00
  Filled 2014-06-11: qty 1

## 2014-06-11 MED ORDER — POTASSIUM CHLORIDE CRYS ER 20 MEQ PO TBCR
20.0000 meq | EXTENDED_RELEASE_TABLET | Freq: Two times a day (BID) | ORAL | Status: DC
  Administered 2014-06-11 – 2014-06-12 (×2): 20 meq via ORAL
  Filled 2014-06-11 (×2): qty 1

## 2014-06-11 MED ORDER — SODIUM CHLORIDE 0.9 % IV SOLN: INTRAVENOUS | Status: DC

## 2014-06-11 MED ORDER — SODIUM CHLORIDE 0.9 % IV SOLN: 250.0000 mL | INTRAVENOUS | Status: DC | PRN

## 2014-06-11 MED ORDER — LEVALBUTEROL TARTRATE 45 MCG/ACT IN AERO: 2.0000 | INHALATION_SPRAY | RESPIRATORY_TRACT | Status: DC | PRN

## 2014-06-11 MED ORDER — ASPIRIN EC 81 MG PO TBEC
81.0000 mg | DELAYED_RELEASE_TABLET | Freq: Every day | ORAL | Status: DC
  Administered 2014-06-12: 12:00:00 81 mg via ORAL
  Filled 2014-06-11: qty 1

## 2014-06-11 MED ORDER — CLOPIDOGREL BISULFATE 75 MG PO TABS
75.0000 mg | ORAL_TABLET | Freq: Every day | ORAL | Status: DC
  Administered 2014-06-12: 75 mg via ORAL
  Filled 2014-06-11: qty 1

## 2014-06-11 MED ORDER — ACETAMINOPHEN 325 MG PO TABS: 650.0000 mg | ORAL_TABLET | ORAL | Status: DC | PRN

## 2014-06-11 MED ORDER — SODIUM CHLORIDE 0.9 % IJ SOLN: 3.0000 mL | INTRAMUSCULAR | Status: DC | PRN

## 2014-06-11 MED ORDER — LIDOCAINE HCL (PF) 1 % IJ SOLN
INTRAMUSCULAR | Status: AC
  Filled 2014-06-11: qty 30

## 2014-06-11 MED ORDER — ONDANSETRON HCL 4 MG/2ML IJ SOLN: 4.0000 mg | Freq: Four times a day (QID) | INTRAMUSCULAR | Status: DC | PRN

## 2014-06-11 MED ORDER — OXYCODONE-ACETAMINOPHEN 5-325 MG PO TABS: 1.0000 | ORAL_TABLET | ORAL | Status: DC | PRN

## 2014-06-11 MED ORDER — SPIRONOLACTONE 25 MG PO TABS
25.0000 mg | ORAL_TABLET | Freq: Every day | ORAL | Status: DC
Start: 2014-06-12 — End: 2014-06-12
  Administered 2014-06-12: 25 mg via ORAL
  Filled 2014-06-11: qty 1

## 2014-06-11 MED ORDER — CLOPIDOGREL BISULFATE 300 MG PO TABS
ORAL_TABLET | ORAL | Status: AC
  Filled 2014-06-11: qty 2

## 2014-06-11 MED ORDER — ATORVASTATIN CALCIUM 40 MG PO TABS
40.0000 mg | ORAL_TABLET | Freq: Every day | ORAL | Status: DC
  Administered 2014-06-11: 40 mg via ORAL
  Filled 2014-06-11 (×2): qty 1

## 2014-06-11 MED ORDER — RAMIPRIL 1.25 MG PO CAPS
1.2500 mg | ORAL_CAPSULE | Freq: Two times a day (BID) | ORAL | Status: DC
  Filled 2014-06-11 (×2): qty 1

## 2014-06-11 MED ORDER — MIDAZOLAM HCL 2 MG/2ML IJ SOLN
INTRAMUSCULAR | Status: AC
  Filled 2014-06-11: qty 2

## 2014-06-11 MED ORDER — SODIUM CHLORIDE 0.9 % IJ SOLN: 3.0000 mL | Freq: Two times a day (BID) | INTRAMUSCULAR | Status: DC

## 2014-06-11 MED ORDER — DIAZEPAM 5 MG PO TABS
ORAL_TABLET | ORAL | Status: AC
  Filled 2014-06-11: qty 2

## 2014-06-11 MED ORDER — DIAZEPAM 5 MG PO TABS
10.0000 mg | ORAL_TABLET | ORAL | Status: AC
  Administered 2014-06-11: 10 mg via ORAL

## 2014-06-11 MED ORDER — PANTOPRAZOLE SODIUM 40 MG PO TBEC
40.0000 mg | DELAYED_RELEASE_TABLET | Freq: Every day | ORAL | Status: DC
  Administered 2014-06-11 – 2014-06-12 (×2): 40 mg via ORAL
  Filled 2014-06-11 (×3): qty 1

## 2014-06-11 MED ORDER — SODIUM CHLORIDE 0.9 % IJ SOLN
3.0000 mL | Freq: Two times a day (BID) | INTRAMUSCULAR | Status: DC
  Administered 2014-06-11: 3 mL via INTRAVENOUS

## 2014-06-11 MED ORDER — FUROSEMIDE 40 MG PO TABS
40.0000 mg | ORAL_TABLET | Freq: Every day | ORAL | Status: DC
  Administered 2014-06-11 – 2014-06-12 (×2): 40 mg via ORAL
  Filled 2014-06-11 (×3): qty 1

## 2014-06-11 MED ORDER — SODIUM CHLORIDE 0.9 % IV SOLN
0.2500 mg/kg/h | INTRAVENOUS | Status: DC
  Filled 2014-06-11: qty 250

## 2014-06-11 MED ORDER — LEVALBUTEROL HCL 1.25 MG/0.5ML IN NEBU
1.2500 mg | INHALATION_SOLUTION | RESPIRATORY_TRACT | Status: DC | PRN
  Filled 2014-06-11: qty 0.5

## 2014-06-11 MED ORDER — VERAPAMIL HCL 2.5 MG/ML IV SOLN
INTRAVENOUS | Status: AC
  Filled 2014-06-11: qty 2

## 2014-06-11 MED ORDER — BIVALIRUDIN 250 MG IV SOLR
INTRAVENOUS | Status: AC
  Filled 2014-06-11: qty 250

## 2014-06-11 MED ORDER — FENTANYL CITRATE (PF) 100 MCG/2ML IJ SOLN
INTRAMUSCULAR | Status: AC
  Filled 2014-06-11: qty 2

## 2014-06-11 MED ORDER — SODIUM CHLORIDE 0.9 % IV SOLN: 1.0000 mL/kg/h | INTRAVENOUS | Status: AC

## 2014-06-11 MED ORDER — HEPARIN (PORCINE) IN NACL 2-0.9 UNIT/ML-% IJ SOLN
INTRAMUSCULAR | Status: AC
  Filled 2014-06-11: qty 1000

## 2014-06-11 MED ORDER — HEPARIN SODIUM (PORCINE) 1000 UNIT/ML IJ SOLN
INTRAMUSCULAR | Status: AC
  Filled 2014-06-11: qty 1

## 2014-06-11 MED ORDER — NITROGLYCERIN 1 MG/10 ML FOR IR/CATH LAB
INTRA_ARTERIAL | Status: AC
  Filled 2014-06-11: qty 10

## 2014-06-11 MED ORDER — CARVEDILOL 12.5 MG PO TABS
12.5000 mg | ORAL_TABLET | Freq: Two times a day (BID) | ORAL | Status: DC
  Administered 2014-06-11 – 2014-06-12 (×3): 12.5 mg via ORAL
  Filled 2014-06-11 (×3): qty 1

## 2014-06-11 NOTE — CV Procedure (Signed)
Cardiac Catheterization Procedure Note  Name: Larry [name] MRN: [id number] DOB: 1950-08-23  Procedure: Left Heart Cath, Selective Coronary Angiography, PTCA and stenting of the mid and distal LAD.  Indication: Severe exertional dyspnea, high risk Myoview scan in patient with known CAD. Myoview scan demonstrated large inferolateral scar as well as antero-lateral and anteroapical ischemia suggestive of multivessel coronary artery disease.  Procedural Details:  The right wrist was prepped, draped, and anesthetized with 1% lidocaine. Using the modified Seldinger technique, a 5/6 French Slender sheath was introduced into the right radial artery. 3 mg of verapamil was administered through the sheath, weight-based unfractionated heparin was administered intravenously. Standard Judkins catheters were used for selective coronary angiography. Catheter exchanges were performed over an exchange length guidewire.  PROCEDURAL FINDINGS Hemodynamics: AO 102/62 LV 102/6   Coronary angiography: Coronary dominance: right  Left mainstem: The left mainstem is patent. The vessel divides into the LAD and left circumflex. There is no significant stenosis, but there is mild irregularity.  Left anterior descending (LAD): The LAD is moderately calcified. The vessel is severely diseased. The proximal LAD is patent with 30-40% stenosis after the second septal perforator. There is diffuse calcification. The diagonal branches are small. The mid LAD has an eccentric 75% stenosis at the origin of the second diagonal branch. Beyond that area the mid and distal LAD are diffusely diseased. There is another severe stenosis in the apical LAD of 90%.  Left circumflex (LCx): The left circumflex is calcified. The vessel is patent with mild irregularity. There are no high-grade stenoses identified. There are scattered 30-40% stenoses throughout the proximal and mid circumflex as well as the first OM branch.  Right  coronary artery (RCA): The RCA is dominant. The vessel is heavily calcified. The stented segments in the mid and distal RCA are patent. The proximal vessel has 30-40% stenosis. The mid vessel has 30-40% stenosis. The distal vessel is patent with 50-60% stenosis involving the origin of the PDA. The PDA is diffusely diseased.  Left ventriculography: Deferred because of chronic kidney disease. By nuclear scan the LVEF is 41%.  PCI Note:  Following the diagnostic procedure, the decision was made to proceed with PCI of the mid and apical LAD.  The right coronary artery had patent stents in the left circumflex had nonobstructive disease. The LAD is a diffusely diseased vessel and the mid and distal vessel would be a poor surgical targets. I felt that PCI was the best option in this patient with a high risk nuclear scan demonstrating anteroapical and anterolateral ischemia. He will require colostomy reversal, so I planned on treating him with Synergy drug-eluting stents which had a biodegradable polymer and potentially can allow for earlier interruption of dual antiplatelet therapy. The patient was loaded with Plavix 600 mg. Weight-based bivalirudin was given for anticoagulation. Once a therapeutic ACT was achieved, a 6 [place] XB LAD 3.5 cm guide catheter was inserted.  A cougar coronary guidewire was used to cross the lesion in the apical LAD.  The lesion was predilated with a 2.5 x 12 mm balloon.  The same balloon was used to dilate the mid lesion. The apical lesion was then stented with a 2.25 x 16 mm Synergy DES.  The stent was postdilated with a 2.5 mm noncompliant balloon.  The mid lesion was then stented with a 3.5 x 16 mm Synergy DES. That stent was postdilated with a 3.75 mm noncompliant balloon. Following PCI, there was 0% residual stenosis and TIMI-3 flow. Final angiography  confirmed an excellent result. The patient tolerated the procedure well. There were no immediate procedural complications. A TR band was  used for radial hemostasis. The patient was transferred to the post catheterization recovery area for further monitoring.  PCI Data: Lesion 1: Vessel - LAD/Segment - distal/apical Percent Stenosis (pre)  90 TIMI-flow 3 Stent 2.25 x 16 mm Synergy DES Percent Stenosis (post) 0 TIMI-flow (post) 3  Lesion 2: Vessel - LAD/Segment - mid Percent Stenosis (pre)  75 TIMI-flow 3 Stent 3.5 x 16 mm Synergy DES Percent Stenosis (post) 0 TIMI-flow (post) 3  Estimated Blood Loss: Minimal  Final Conclusions:   1. Two-vessel coronary artery disease with continued patency of the stented segments in the right coronary artery and severe stenoses of the mid and apical LAD 2. Mild diffuse nonobstructive left circumflex stenosis 3. Successful PCI of the LAD using 2 drug-eluting stents   Recommendations:  Dual antiplatelet therapy for at least 6 months. Would be reasonable to consider interruption of aspirin and Plavix at 6 months for colostomy reversal.  [name] MD, [facility] 06/11/2014, 10:06 AM

## 2014-06-11 NOTE — Progress Notes (Signed)
TR BAND REMOVAL  LOCATION:    right radial  DEFLATED PER PROTOCOL:    Yes.    TIME BAND OFF / DRESSING APPLIED:    1400   SITE UPON ARRIVAL:    Level 0  SITE AFTER BAND REMOVAL:    Level 0  REVERSE ALLEN'S TEST:     positive  CIRCULATION SENSATION AND MOVEMENT:    Within Normal Limits   Yes.    COMMENTS:   Tolerated procedure well

## 2014-06-11 NOTE — H&P (View-Only) (Signed)
CARDIOLOGY OFFICE NOTE  Date:  06/09/2014    [name] Date of Birth: 1951-02-05 Medical Record #[id number]  PCP:  Kristine Garbe, MD  Cardiologist:  Allred    Chief Complaint  Patient presents with  . Pre-op Exam    Pre op visit/after Myoview - seen for Dr. Rayann Heman.     History of Present Illness: Larry [name] is a 64 y.o. male who presents today for a pre op/follow up after Myoview visit. Seen for Dr. Rayann Heman. He has an ischemic CM with severe diffuse 3VD and prior stents to the RCA in 2010 per Dr. Angelena Form, systolic and diastolic dysfunction, HTN, HLD and obesity. Has had a history of atrial tach that has been treated with beta blocker. Was hospitalized back in April of 2013 with a HF exacerbation - had stopped his medicines. Last EF of 40 - to 70% with diastolic dysfunction as well noted in January of 2015.   Seen by me back in October of 2014 - Was out of his medicines again. Had lost weight and was anemic - was sending to GI and ended up having colon cancer.   He was admitted (02/02/2013 - 03/01/2013) to [facility] with subacute history of progressive weakness, constipation and difficulty eating and drinking. He presented with acute renal failure (Creatinine 7.83) and severe anemia (Hbg of 7.3) as well as obstruction. Colonoscopy doing this admission with biopsy revealed adenocarcinoma and patient underwent exploratory lap with partial colectomy with colostomy on 12/23 by Dr. Hulen Skains. Recovery was complicated by wound dehissance, requiring a return to the OR 12/29 with abdominal wound closure with VAC placment. After the procedure, the patient developed atrial tachycardia, so a dilt drip was started (12/29-1/2). On 1/4, the patient had a cardiac arrest, requiring defibrillation, and was subsequently transferred to the ICU. QT was found to be > 700, with mild hypokalemia and hypomagnesemia implicated as the cause. Lidocaine drip was started. Electrolyte  abnormality treated and 2D-echo revealed an EF of 40-45% with periapical akinesis and biatrial enlargement. He experienced recurrent torsades on 01/05 and shocked in NSR. By 1/6, QTc had decreased to 499, lidocaine drip was d/c'ed, and patient was transferred out of ICU to stepdown. Then admitted to physical medicine and rehabilitation on 01/09 due to severe deconditioning. He was continued on protein supplement to promote wound healing. His abdominal wound was treated with VAC through 03/05/13. His heart rate was controlled without recurrent arrythmia and was maintained on potassium and magnesium supplements. He was discharged to home on 03/07/2013. He was last evaluated by Dr. Hulen Skains on 02/03 with review of his pathology demonstrating invasive adenocarcinoma of the transverse colon with penetration into the pericolonic fat. He had 0/18 lymph nodes negative for metastatic disease. He was pathologic stage [id number].   Saw Dr. Gwenlyn Found in early July of 2015 for a foot ulcer - basically normal ABI's done.   He was readmitted to the hospital in August of 2015 - triggered by too much salt. Presented with respiratory distress - treated with Bipap and quickly stabilized. Was diuresed about 6 liters as well. Started on low dose ACE. Sleep study recommended.  I have seen back here several times since that hospitalization - last in December - he had improved. Titrated as much medicine as BP could tolerate. Felt to be stable from our standpoint. Referred to Sleep Medicine/Dr. Annamaria Boots and had CPAP for his OSA.   Last seen in March - wanting to get his colostomy reversed and  needing pre op clearance. He was feeling pretty good - no real cardiac symptoms noted. Does have a superficial leg ulcer and is being treated by the wound clinic and has seen Dr. Bridgett Larsson for evaluation as well.   Comes in today. Here alone. He continues to do ok. No chest pain. Not short of breath. Rare "fluttering" noted. Not dizzy or lightheaded. Feels  like his leg ulcer is improving. Remains fairly sedentary. Not aware of his stress test results.   Past Medical History  Diagnosis Date  . Hypertension   . Hypercholesteremia   . CHF (congestive heart failure)     has diastolic heart failure grade 1; EF is 45 to 50% per echo 05/2011  . Coronary artery disease   . COPD (chronic obstructive pulmonary disease)   . Asthma   . Obesity   . Noncompliance   . NSVT (nonsustained ventricular tachycardia)     beta blocker restarted  . Atrial tachycardia     managed on beta blocker therapy  . Colon cancer   . Peripheral arterial disease     small ulcer the head of right metatarsal plantar surface right foot  . Sleep apnea     cpap used nightly  . Cardiac arrest     X 2 episodes during hospital visit 12'14"electrolyte imbalance"- "Shocked"  . S/P colostomy     2014    Past Surgical History  Procedure Laterality Date  . Coronary stent placement      x2  . Cardiac catheterization  11/12/2008    stent x 2 to RCA  . Colonoscopy N/A 02/08/2013    Procedure: COLONOSCOPY;  Surgeon: [name], MD;  Location: [facility];  Service: Endoscopy;  Laterality: N/A;  . Esophagogastroduodenoscopy N/A 02/08/2013    Procedure: ESOPHAGOGASTRODUODENOSCOPY (EGD);  Surgeon: [name], MD;  Location: [facility] ENDOSCOPY;  Service: Endoscopy;  Laterality: N/A;  . Laparotomy N/A 02/12/2013    Procedure: EXPLORATORY LAPAROTOMY PARTIAL COLECTOMY WITH COLOSTOMY;  Surgeon: [name], MD;  Location: [facility];  Service: General;  Laterality: N/A;  . Laparotomy N/A 02/18/2013    Procedure: EXPLORATORY LAPAROTOMY/Closure of Wound;  Surgeon: [name], MD;  Location: [place];  Service: General;  Laterality: N/A;  . Colonoscopy with propofol N/A 05/09/2014    Procedure: COLONOSCOPY WITH PROPOFOL;  Surgeon: [name], MD;  Location: [facility] ENDOSCOPY;  Service: Endoscopy;  Laterality: N/A;     Medications: Current Outpatient Prescriptions  Medication Sig  Dispense Refill  . aspirin EC 81 MG tablet Take 1 tablet (81 mg total) by mouth daily. 30 tablet 0  . atorvastatin (LIPITOR) 40 MG tablet Take 1 tablet (40 mg total) by mouth daily. (Patient taking differently: Take 40 mg by mouth daily at 6 PM. ) 90 tablet 3  . carvedilol (COREG) 12.5 MG tablet Take 1 tablet (12.5 mg total) by mouth 2 (two) times daily. 180 tablet 3  . cephALEXin (KEFLEX) 500 MG capsule     . ferrous sulfate 325 (65 FE) MG tablet Take 1 tablet (325 mg total) by mouth daily with breakfast. 30 tablet 1  . furosemide (LASIX) 40 MG tablet TAKE 1 TABLET BY MOUTH EVERY DAY 30 tablet 0  . KLOR-CON M20 20 MEQ tablet TAKE 1 TABLET TWICE DAILY 60 tablet 3  . levalbuterol (XOPENEX HFA) 45 MCG/ACT inhaler Inhale 2 puffs into the lungs every 4 (four) hours as needed for wheezing. 1 Inhaler 11  . Magnesium 500 MG CAPS Take 500 mg by mouth  daily.    . Multiple Vitamins-Minerals (MULTIVITAMIN WITH MINERALS) tablet Take 1 tablet by mouth daily.    . nitroGLYCERIN (NITROSTAT) 0.4 MG SL tablet Place 0.4 mg under the tongue every 5 (five) minutes as needed for chest pain.    . pantoprazole (PROTONIX) 40 MG tablet TAKE 1 TABLET (40 MG TOTAL) BY MOUTH AT BEDTIME. 30 tablet 0  . ramipril (ALTACE) 1.25 MG capsule TAKE ONE CAPSULE BY MOUTH EVERY 12 HOURS 60 capsule 3  . simethicone (GAS-X) 80 MG chewable tablet Chew 1 tablet (80 mg total) by mouth 4 (four) times daily. 120 tablet 0  . spironolactone (ALDACTONE) 25 MG tablet TAKE 1 TABLET (25 MG TOTAL) BY MOUTH DAILY. 30 tablet 0  . MOVIPREP 100 G SOLR      No current facility-administered medications for this visit.    Allergies: No Known Allergies  Social History: The patient  reports that he quit smoking about 5 years ago. His smoking use included Cigarettes. He has never used smokeless tobacco. He reports that he does not drink alcohol or use illicit drugs.   Family History: The patient's family history includes Diabetes in his father; Heart  disease in his father; Hypertension in his father and mother.   Review of Systems: Please see the history of present illness.   Otherwise, the review of systems is positive for leg swelling.   All other systems are reviewed and negative.   Physical Exam: VS:  BP 108/78 mmHg  Pulse 97  Resp 20  Ht 5\' 10"  (1.778 m)  Wt 366 lb (166.017 kg)  BMI 52.52 kg/m2  SpO2 95% .  BMI Body mass index is 52.52 kg/(m^2).  Wt Readings from Last 3 Encounters:  06/09/14 366 lb (166.017 kg)  06/06/14 363 lb (164.656 kg)  05/29/14 363 lb (164.656 kg)    General: Pleasant. He is an obese black male in no acute distress.  HEENT: Normal. Neck: Supple, no JVD, carotid bruits, or masses noted.  Cardiac: Regular rate and rhythm. Heart tones distant. Chronic lower extremity edema.  Respiratory:  Lungs are clear to auscultation bilaterally with normal work of breathing. Decreased breath sounds.  GI: Soft and nontender. Abdomen is obese.  MS: No deformity or atrophy. Gait and ROM intact. Skin: Warm and dry. Color is normal.  Neuro:  Strength and sensation are intact and no gross focal deficits noted.  Psych: Alert, appropriate and with normal affect.   LABORATORY DATA:  EKG:  EKG is not ordered today.   Lab Results  Component Value Date   WBC 6.7 05/12/2014   HGB 13.1 05/12/2014   HCT 41.1 05/12/2014   PLT 175 05/12/2014   GLUCOSE 93 05/12/2014   CHOL 158 10/02/2013   TRIG 117 10/02/2013   HDL 67 10/02/2013   LDLCALC 68 10/02/2013   ALT 19 05/12/2014   AST 17 05/12/2014   NA 137 05/12/2014   K 4.6 05/12/2014   CL 103 05/12/2014   CREATININE 1.96* 05/12/2014   BUN 21 05/12/2014   CO2 26 05/12/2014   TSH 0.70 12/06/2012   INR 1.00 09/30/2013   HGBA1C 6.0 11/26/2009    BNP (last 3 results) No results for input(s): BNP in the last 8760 hours.  ProBNP (last 3 results)  Recent Labs  09/30/13 0736 10/15/13 1117 01/31/14 1012  PROBNP 397.2* 9.0 7.0     Other Studies Reviewed  Today:   Myoview Impression April 2016 Exercise Capacity: Lexiscan with no exercise. BP Response: Normal blood pressure  response. Clinical Symptoms: There is dyspnea. ECG Impression: No significant ST segment change suggestive of ischemia. Comparison with Prior Nuclear Study: No images to compare  Overall Impression: High risk stress nuclear study large inferior wall infarct from apex to base. Also suggestion of apical, anterior and lateral wall ischemia.  LV Ejection Fraction: 41%. LV Wall Motion: Inferiior akinesis Consistent with possible 3 VD Consider cath before major surgery   [place]   Echo Study Conclusions from January 2015  - Left ventricle: The cavity size was mildly dilated. Wall thickness was increased in a pattern of mild LVH. Systolic function was mildly to moderately reduced. The estimated ejection fraction was in the range of 40% to 45%. There was akinesis of the apical lateral, apical anterior, and apical septal segments. There was akinesis of the true apex. Prominent trabeculation at the apex but I do not think thrombus was present. Patient had Definity study recently to look at the apex and no thrombus was seen. Doppler parameters are consistent with abnormal left ventricular relaxation (grade 1 diastolic dysfunction). - Aortic valve: Trileaflet; moderately calcified leaflets. There was no stenosis. - Mitral valve: Moderately calcified annulus. Mildly calcified leaflets . Trivial regurgitation. - Left atrium: The atrium was moderately dilated. - Right ventricle: The cavity size was normal. Systolic function was normal. - Right atrium: The atrium was mildly dilated. - Pulmonary arteries: No complete TR doppler jet so unable to estimate PA systolic pressure. - Systemic veins: IVC was not visualized. - Pericardium, extracardiac: A trivial pericardial effusion was identified. Impressions:  - Mildly dilated LV with mild LV hypertrophy. EF  40-45% with peri-apical akinesis. I do not think that LV apical thrombus is present. Normal RV size and systolic function. Biatrial enlargement.   Assessment / Plan:  1. Pre op clearance - lots of cardiac issues - now with high risk Myoview - reviewed with patient - will proceed on with cardiac cath - scheduled for Wednesday with Dr. Burt Knack. The patient understands that risks include but are not limited to stroke (1 in 1000), death (1 in 32), kidney failure [usually temporary] (1 in 500), bleeding (1 in 200), allergic reaction [possibly serious] (1 in 200), and agrees to proceed.   2. Combined systolic and diastolic HF - he has chronic issues with his volume.   3. CAD with prior PCI - no chest pain.   4. Colon cancer - followed by oncology - hopeful to have his colostomy reversed.   5. HTN - stable - BP limiting how much HF meds he can have.   5. CKD - on very low dose ACE   6. OSA - now on CPAP and followed by Dr. Annamaria Boots  7. HLD - on statin  8. Atrial tachycardia - beta blocker increased at last visit.    Current medicines are reviewed with the patient today.  The patient does not have concerns regarding medicines other than what has been noted above.  The following changes have been made:  See above.  Labs/ tests ordered today include:    Orders Placed This Encounter  Procedures  . Basic metabolic panel  . CBC  . Protime-INR  . APTT     Disposition:   Further disposition to follow.   Patient is agreeable to this plan and will call if any problems develop in the interim.   Signed: Burtis Junes, RN, ANP-C 06/09/2014 10:48 AM  [facility] [address] [place], [place]  [address] Phone: [phone] Fax: [phone]

## 2014-06-11 NOTE — Interval H&P Note (Signed)
History and Physical Interval Note:  06/11/2014 8:51 AM  Emett L Milke  has presented today for surgery, with the diagnosis of abn stress test  The various methods of treatment have been discussed with the patient and family. After consideration of risks, benefits and other options for treatment, the patient has consented to  Procedure(s): LEFT HEART CATHETERIZATION WITH CORONARY ANGIOGRAM (N/A) as a surgical intervention .  The patient's history has been reviewed, patient examined, no change in status, stable for surgery.  I have reviewed the patient's chart and labs.  Questions were answered to the patient's satisfaction.    Cath Lab Visit (complete for each Cath Lab visit)  Clinical Evaluation Leading to the Procedure:   ACS: No.  Non-ACS:    Anginal Classification: CCS III  Anti-ischemic medical therapy: Minimal Therapy (1 class of medications)  Non-Invasive Test Results: High-risk stress test findings: cardiac mortality >3%/year  Prior CABG: No previous CABG       [name]

## 2014-06-12 ENCOUNTER — Telehealth: Payer: Self-pay | Admitting: Internal Medicine

## 2014-06-12 ENCOUNTER — Encounter (HOSPITAL_COMMUNITY): Payer: Self-pay | Admitting: Physician Assistant

## 2014-06-12 DIAGNOSIS — N183 Chronic kidney disease, stage 3 unspecified: Secondary | ICD-10-CM | POA: Diagnosis present

## 2014-06-12 DIAGNOSIS — I209 Angina pectoris, unspecified: Secondary | ICD-10-CM | POA: Diagnosis not present

## 2014-06-12 DIAGNOSIS — Z9861 Coronary angioplasty status: Secondary | ICD-10-CM

## 2014-06-12 DIAGNOSIS — I471 Supraventricular tachycardia: Secondary | ICD-10-CM | POA: Diagnosis not present

## 2014-06-12 DIAGNOSIS — I2510 Atherosclerotic heart disease of native coronary artery without angina pectoris: Secondary | ICD-10-CM

## 2014-06-12 DIAGNOSIS — D696 Thrombocytopenia, unspecified: Secondary | ICD-10-CM | POA: Diagnosis not present

## 2014-06-12 DIAGNOSIS — I255 Ischemic cardiomyopathy: Secondary | ICD-10-CM | POA: Diagnosis not present

## 2014-06-12 DIAGNOSIS — Z9049 Acquired absence of other specified parts of digestive tract: Secondary | ICD-10-CM | POA: Diagnosis not present

## 2014-06-12 LAB — BASIC METABOLIC PANEL
Anion gap: 7 (ref 5–15)
BUN: 14 mg/dL (ref 6–23)
CO2: 25 mmol/L (ref 19–32)
CREATININE: 1.66 mg/dL — AB (ref 0.50–1.35)
Calcium: 9 mg/dL (ref 8.4–10.5)
Chloride: 105 mmol/L (ref 96–112)
GFR calc Af Amer: 50 mL/min — ABNORMAL LOW (ref >90)
GFR calc non Af Amer: 43 mL/min — ABNORMAL LOW (ref >90)
GLUCOSE: 97 mg/dL (ref 70–99)
POTASSIUM: 4.2 mmol/L (ref 3.5–5.1)
SODIUM: 137 mmol/L (ref 135–145)

## 2014-06-12 LAB — CBC
HCT: 37.5 % — ABNORMAL LOW (ref 39.0–52.0)
Hemoglobin: 11.6 g/dL — ABNORMAL LOW (ref 13.0–17.0)
MCH: 27.8 pg (ref 26.0–34.0)
MCHC: 30.9 g/dL (ref 30.0–36.0)
MCV: 89.7 fL (ref 78.0–100.0)
PLATELETS: 132 10*3/uL — AB (ref 150–400)
RBC: 4.18 MIL/uL — ABNORMAL LOW (ref 4.22–5.81)
RDW: 14.9 % (ref 11.5–15.5)
WBC: 4.6 10*3/uL (ref 4.0–10.5)

## 2014-06-12 MED ORDER — CLOPIDOGREL BISULFATE 75 MG PO TABS: 75.0000 mg | ORAL_TABLET | Freq: Every day | ORAL | Status: DC

## 2014-06-12 MED ORDER — FUROSEMIDE 40 MG PO TABS: 40.0000 mg | ORAL_TABLET | Freq: Every day | ORAL | Status: DC

## 2014-06-12 MED FILL — Sodium Chloride IV Soln 0.9%: INTRAVENOUS | Qty: 50 | Status: AC

## 2014-06-12 NOTE — Discharge Summary (Signed)
CARDIOLOGY DISCHARGE SUMMARY   Patient ID: Larry [name] MRN: [id number] DOB/AGE: 62-25-1952 62 y.o.  Admit date: 06/11/2014 Discharge date: 06/12/2014  PCP: Kristine Garbe, MD Primary Cardiologist: Dr. Rayann Heman, sees Truitt Merle NP regularly  Primary Discharge Diagnosis:  Ischemic chest pain - s/p DES 2 LAD Secondary Discharge Diagnosis:    Atrial tachycardia, paroxysmal   CKD (chronic kidney disease) stage 3, GFR 30-59 ml/min   Thrombocytopenia    Atrial tachycardia  Procedures: Left Heart Cath, Selective Coronary Angiography, PTCA and stenting of the mid and distal LAD.  Hospital Course: Larry [name] is a 64 y.o. male with a history of CAD. He has had a colostomy in the past because of colon cancer, and was due to have a take down of this. A Myoview was performed as part of his preoperative evaluation and was abnormal. He was seen in the office and scheduled for cardiac catheterization. This was performed on 06/11/2014.  Cardiac catheterization results are below. Previous stents in the RCA were patent. However, he had significant disease in the mid and apical LAD, treated with 2 drug-eluting stents. He tolerated the procedure well.  On 06/12/2014, he was seen by Dr. Ellyn Hack and all data were reviewed. He has chronic kidney disease, but his creatinine only had minimal elevation after cath. His ramipril will be held for 48 hours. He will stay on the Lasix daily and spironolactone daily for volume management.  He has a history of atrial tachycardia and was in/out of this overnight. He was asymptomatic with it and his vital signs were stable. His systolic blood pressure was too low to increase his beta blocker. He is to follow-up in the office for further management.  He was seen by cardiac rehabilitation and educated on stent restrictions, heart-healthy lifestyle modifications and exercise guidelines. He was ambulating at baseline. No further inpatient workup was  indicated and he is considered stable for discharge, to follow up as an outpatient.  Labs:   Lab Results  Component Value Date   WBC 4.6 06/12/2014   HGB 11.6* 06/12/2014   HCT 37.5* 06/12/2014   MCV 89.7 06/12/2014   PLT 132* 06/12/2014     Recent Labs Lab 06/12/14 0556  NA 137  K 4.2  CL 105  CO2 25  BUN 14  CREATININE 1.66*  CALCIUM 9.0  GLUCOSE 97   No results for input(s): INR in the last 72 hours. Cardiac Cath: 06/11/2014 Left mainstem: The left mainstem is patent. The vessel divides into the LAD and left circumflex. There is no significant stenosis, but there is mild irregularity. Left anterior descending (LAD): The LAD is moderately calcified. The vessel is severely diseased. The proximal LAD is patent with 30-40% stenosis after the second septal perforator. There is diffuse calcification. The diagonal branches are small. The mid LAD has an eccentric 75% stenosis at the origin of the second diagonal branch. Beyond that area the mid and distal LAD are diffusely diseased. There is another severe stenosis in the apical LAD of 90%. Left circumflex (LCx): The left circumflex is calcified. The vessel is patent with mild irregularity. There are no high-grade stenoses identified. There are scattered 30-40% stenoses throughout the proximal and mid circumflex as well as the first OM branch. Right coronary artery (RCA): The RCA is dominant. The vessel is heavily calcified. The stented segments in the mid and distal RCA are patent. The proximal vessel has 30-40% stenosis. The mid vessel has 30-40% stenosis. The distal vessel is  patent with 50-60% stenosis involving the origin of the PDA. The PDA is diffusely diseased. Left ventriculography: Deferred because of chronic kidney disease. By nuclear scan the LVEF is 41%. PCI Data: Lesion 1: Vessel - LAD/Segment - distal/apical Percent Stenosis (pre) 90 TIMI-flow 3 Stent 2.25 x 16 mm Synergy DES Percent Stenosis (post) 0 TIMI-flow  (post) 3 Lesion 2: Vessel - LAD/Segment - mid Percent Stenosis (pre) 75 TIMI-flow 3 Stent 3.5 x 16 mm Synergy DES Percent Stenosis (post) 0 TIMI-flow (post) 3 Final Conclusions:  1. Two-vessel coronary artery disease with continued patency of the stented segments in the right coronary artery and severe stenoses of the mid and apical LAD 2. Mild diffuse nonobstructive left circumflex stenosis 3. Successful PCI of the LAD using 2 drug-eluting stents  Recommendations:  Dual antiplatelet therapy for at least 6 months. Would be reasonable to consider interruption of aspirin and Plavix at 6 months for colostomy reversal.   EKG: 06/12/2014 SR, Q waves V1 & V3  FOLLOW UP PLANS AND APPOINTMENTS No Known Allergies   Medication List    TAKE these medications        aspirin EC 81 MG tablet  Take 1 tablet (81 mg total) by mouth daily.     atorvastatin 40 MG tablet  Commonly known as:  LIPITOR  Take 1 tablet (40 mg total) by mouth daily.     carvedilol 12.5 MG tablet  Commonly known as:  COREG  Take 1 tablet (12.5 mg total) by mouth 2 (two) times daily.     clopidogrel 75 MG tablet  Commonly known as:  PLAVIX  Take 1 tablet (75 mg total) by mouth daily with breakfast.     ferrous sulfate 325 (65 FE) MG tablet  Take 1 tablet (325 mg total) by mouth daily with breakfast.     furosemide 40 MG tablet  Commonly known as:  LASIX  Take 1 tablet (40 mg total) by mouth daily.     KLOR-CON M20 20 MEQ tablet  Generic drug:  potassium chloride SA  TAKE 1 TABLET TWICE DAILY     levalbuterol 45 MCG/ACT inhaler  Commonly known as:  XOPENEX HFA  Inhale 2 puffs into the lungs every 4 (four) hours as needed for wheezing.     Magnesium 500 MG Caps  Take 500 mg by mouth daily.     multivitamin with minerals tablet  Take 1 tablet by mouth daily.     nitroGLYCERIN 0.4 MG SL tablet  Commonly known as:  NITROSTAT  Place 0.4 mg under the tongue every 5 (five) minutes as needed for chest  pain.     pantoprazole 40 MG tablet  Commonly known as:  PROTONIX  TAKE 1 TABLET (40 MG TOTAL) BY MOUTH AT BEDTIME.     ramipril 1.25 MG capsule  Commonly known as:  ALTACE  TAKE ONE CAPSULE BY MOUTH EVERY 12 HOURS  Notes to Patient:  HOLD for 48 hours, restart on 06/14/2014     simethicone 80 MG chewable tablet  Commonly known as:  GAS-X  Chew 1 tablet (80 mg total) by mouth 4 (four) times daily.     spironolactone 25 MG tablet  Commonly known as:  ALDACTONE  TAKE 1 TABLET (25 MG TOTAL) BY MOUTH DAILY.        Discharge Instructions    Diet - low sodium heart healthy    Complete by:  As directed      Increase activity slowly    Complete by:  As directed           Follow-up Information    Follow up with Truitt Merle, NP.   Specialty:  Nurse Practitioner   Why:  The office will call   Contact information:   [place] [place] [address] [phone]       Follow up with Thompson Grayer, MD.   Specialty:  Cardiology   Why:  As scheduled   Contact information:   [address] [place] [address] [phone]       BRING ALL MEDICATIONS WITH YOU TO FOLLOW UP APPOINTMENTS  Time spent with patient to include physician time: 48 min Signed: Rosaria Ferries, PA-C 06/12/2014, 12:20 PM Co-Sign MD

## 2014-06-12 NOTE — Progress Notes (Signed)
Patient Name: Larry [name] Date of Encounter: 06/12/2014  Principal Problem:   Ischemic chest pain Active Problems:   Atrial tachycardia, paroxysmal   CKD (chronic kidney disease) stage 3, GFR 30-59 ml/min   Thrombocytopenia   Primary Cardiologist: Dr Rayann Heman  Patient Profile: 64 yo male w/ hx ICM, MV pre-op was abnl, came in for cath 04/20, had PTCA and stenting of the mid and distal LAD  SUBJECTIVE: DOE, but says this is chronic. No chest pain  OBJECTIVE Filed Vitals:   06/11/14 2026 06/12/14 0026 06/12/14 0624 06/12/14 0804  BP: 105/76 107/52 99/58 111/75  Pulse: 92 90 85 90  Temp: 98.2 F (36.8 C) 98.3 F (36.8 C) 97.7 F (36.5 C) 97.9 F (36.6 C)  TempSrc: Oral Oral Oral Oral  Resp: 15 17 20 20   Height:      Weight:  363 lb (164.656 kg)    SpO2: 99% 100% 100% 99%    Intake/Output Summary (Last 24 hours) at 06/12/14 0835 Last data filed at 06/12/14 0803  Gross per 24 hour  Intake 2081.92 ml  Output   2875 ml  Net -793.08 ml   Filed Weights   06/11/14 0619 06/12/14 0026  Weight: 363 lb (164.656 kg) 363 lb (164.656 kg)    PHYSICAL EXAM General: Well developed, well nourished, male in no acute distress. Head: Normocephalic, atraumatic.  Neck: Supple without bruits, JVD not elevated, difficult to assess 2nd body habitus. Lungs:  Resp regular and unlabored, few rales, slight wheeze. Heart: RRR, S1, S2, no S3, S4, soft murmur; no rub. Abdomen: Soft, non-tender, non-distended, BS + x 4.  Extremities: No clubbing, cyanosis, trace edema R, Unna boot L, Right radial cath site w/out ecchymosis/hematoma; chronic stasis changes Neuro: Alert and oriented X 3. Moves all extremities spontaneously. Psych: Normal affect.  LABS: CBC:  Recent Labs  06/09/14 1100 06/12/14 0556  WBC 5.9 4.6  HGB 12.5* 11.6*  HCT 38.3* 37.5*  MCV 85.4 89.7  PLT 143.0 Result may be falsely decreased due to platelet clumping.* 132*   INR:  Recent Labs  06/09/14 1100    INR 1.0   Basic Metabolic Panel:  Recent Labs  06/11/14 0630 06/12/14 0556  NA 139 137  K 4.2 4.2  CL 104 105  CO2 24 25  GLUCOSE 101* 97  BUN 18 14  CREATININE 1.77* 1.66*  CALCIUM 9.7 9.0    TELE:  SR, In/out of Atrial tach overnight      ECG: SR, Q waves V1 & V3  Cath: Left mainstem: The left mainstem is patent. The vessel divides into the LAD and left circumflex. There is no significant stenosis, but there is mild irregularity.  Left anterior descending (LAD): The LAD is moderately calcified. The vessel is severely diseased. The proximal LAD is patent with 30-40% stenosis after the second septal perforator. There is diffuse calcification. The diagonal branches are small. The mid LAD has an eccentric 75% stenosis at the origin of the second diagonal branch. Beyond that area the mid and distal LAD are diffusely diseased. There is another severe stenosis in the apical LAD of 90%.  Left circumflex (LCx): The left circumflex is calcified. The vessel is patent with mild irregularity. There are no high-grade stenoses identified. There are scattered 30-40% stenoses throughout the proximal and mid circumflex as well as the first OM branch.  Right coronary artery (RCA): The RCA is dominant. The vessel is heavily calcified. The stented segments in the mid and distal  RCA are patent. The proximal vessel has 30-40% stenosis. The mid vessel has 30-40% stenosis. The distal vessel is patent with 50-60% stenosis involving the origin of the PDA. The PDA is diffusely diseased.  Left ventriculography: Deferred because of chronic kidney disease. By nuclear scan the LVEF is 41%. PCI Data: Lesion 1: Vessel - LAD/Segment - distal/apical Percent Stenosis (pre) 90 TIMI-flow 3 Stent 2.25 x 16 mm Synergy DES Percent Stenosis (post) 0 TIMI-flow (post) 3  Lesion 2: Vessel - LAD/Segment - mid Percent Stenosis (pre) 75 TIMI-flow 3 Stent 3.5 x 16 mm Synergy DES Percent Stenosis (post)  0 TIMI-flow (post) 3 Final Conclusions:  1. Two-vessel coronary artery disease with continued patency of the stented segments in the right coronary artery and severe stenoses of the mid and apical LAD 2. Mild diffuse nonobstructive left circumflex stenosis 3. Successful PCI of the LAD using 2 drug-eluting stents  Recommendations:  Dual antiplatelet therapy for at least 6 months. Would be reasonable to consider interruption of aspirin and Plavix at 6 months for colostomy reversal.   Current Medications:  . aspirin EC  81 mg Oral Daily  . atorvastatin  40 mg Oral q1800  . carvedilol  12.5 mg Oral BID  . clopidogrel  75 mg Oral Q breakfast  . furosemide  40 mg Oral Daily  . pantoprazole  40 mg Oral Daily  . potassium chloride SA  20 mEq Oral BID  . ramipril  1.25 mg Oral BID  . sodium chloride  3 mL Intravenous Q12H  . spironolactone  25 mg Oral Daily      ASSESSMENT AND PLAN: Active Problems:   Ischemic chest pain - s/p PTCA and stenting of the mid and distal LAD - on ASA, Plavix, BB, statin, low-dose ACE, spiro    CKD, stage III - Cr up slightly after cath, but in line w/ previous values - MD advise on holding ACE, spiro and Lasix today    Atrial tachycardia - in/out overnight - Continue BB, BP too low for dose increase  Plan - d/c when medically stable, hopefully today, f/u in office  Signed, Rosaria Ferries , PA-C 8:35 AM 06/12/2014    I have seen, examined and evaluated the patient this AM along with Rosaria Ferries, PA-C.  After reviewing all the available data and chart,  I agree with her findings, examination as well as impression recommendations.  Stable post PCI.  Has not yet walked.   ATach overnight - no real Sx - is on 12.5 mg Carvedilol & BP will not allow further titration -- only on minimal ACE-I & Aldactone. - will defer to Primary Cardiologist - ? Change to different BB  Ready for d/c today after ambulation.  ROV as scheduled.    Leonie Man, M.D., M.S. Interventional Cardiologist   Pager # [phone]

## 2014-06-12 NOTE — Progress Notes (Signed)
CARDIAC REHAB PHASE I   PRE:  Rate/Rhythm: 92 SR    BP: sitting 111/75    SaO2:   MODE:  Ambulation: 200 ft   POST:  Rate/Rhythm: 110 ST    BP: sitting 138/79     SaO2: 96 RA  Pt fatigues easily with DOE. Sts this is his norm and that he doesn't walk much, this was most he has walked in a while. Pt sts he doesn't know why he doesn't ex more. Highly encouraged him to slowly increase walking to lose weight and be healthier. Discussed healthier eating and low sodium. Gave HF booklet and discussed daily wts. Pt understands not to stop Plavix/ASA. Not interested in CRPII as he does not like crowds.  [phone]   Josephina Shih [place] CES, ACSM 06/12/2014 9:37 AM

## 2014-06-12 NOTE — Discharge Instructions (Signed)
PLEASE REMEMBER TO BRING ALL OF YOUR MEDICATIONS TO EACH OF YOUR FOLLOW-UP OFFICE VISITS. ° °PLEASE ATTEND ALL SCHEDULED FOLLOW-UP APPOINTMENTS.  ° °Activity: Increase activity slowly as tolerated. You may shower, but no soaking baths (or swimming) for 1 week. No driving for 2 days. No lifting over 5 lbs for 1 week. No sexual activity for 1 week.  ° °You May Return to Work: in 1 week (if applicable) ° °Wound Care: You may wash cath site gently with soap and water. Keep cath site clean and dry. If you notice pain, swelling, bleeding or pus at your cath site, please call [phone]. ° ° ° °Cardiac Cath Site Care °Refer to this sheet in the next few weeks. These instructions provide you with information on caring for yourself after your procedure. Your caregiver may also give you more specific instructions. Your treatment has been planned according to current medical practices, but problems sometimes occur. Call your caregiver if you have any problems or questions after your procedure. °HOME CARE INSTRUCTIONS °· You may shower 24 hours after the procedure. Remove the bandage (dressing) and gently wash the site with plain soap and water. Gently pat the site dry.  °· Do not apply powder or lotion to the site.  °· Do not sit in a bathtub, swimming pool, or whirlpool for 5 to 7 days.  °· No bending, squatting, or lifting anything over 10 pounds (4.5 kg) as directed by your caregiver.  °· Inspect the site at least twice daily.  °· Do not drive home if you are discharged the same day of the procedure. Have someone else drive you.  °· You may drive 24 hours after the procedure unless otherwise instructed by your caregiver.  °What to expect: °· Any bruising will usually fade within 1 to 2 weeks.  °· Blood that collects in the tissue (hematoma) may be painful to the touch. It should usually decrease in size and tenderness within 1 to 2 weeks.  °SEEK IMMEDIATE MEDICAL CARE IF: °· You have unusual pain at the site or down the  affected limb.  °· You have redness, warmth, swelling, or pain at the site.  °· You have drainage (other than a small amount of blood on the dressing).  °· You have chills.  °· You have a fever or persistent symptoms for more than 72 hours.  °· You have a fever and your symptoms suddenly get worse.  °· Your leg becomes pale, cool, tingly, or numb.  °· You have heavy bleeding from the site. Hold pressure on the site.  °Document Released: 03/12/2010 Document Revised: 01/27/2011 Document Reviewed: 03/12/2010 °[employer]® Patient Information ©2012 [employer]. ° °

## 2014-06-12 NOTE — Telephone Encounter (Signed)
New message        TCM appt is 5/5 at 2pm with Cecille Rubin

## 2014-06-13 NOTE — Telephone Encounter (Signed)
Patient contacted regarding discharge from [facility] hospital 4/21 Patient understands to follow up with provider ? 5/5 @ 2 pm with Cecille Rubin; pt advised to get her 15 mins early Patient understands discharge instructions? Yes Patient understands medications and regiment? Yes  Patient understands to bring all medications to this visit? Yes

## 2014-06-13 NOTE — Telephone Encounter (Signed)
Left message for patient to call back on home and mobile numbers.

## 2014-06-17 ENCOUNTER — Encounter: Payer: Self-pay | Admitting: *Deleted

## 2014-06-17 ENCOUNTER — Other Ambulatory Visit: Payer: Self-pay | Admitting: Internal Medicine

## 2014-06-17 DIAGNOSIS — I87312 Chronic venous hypertension (idiopathic) with ulcer of left lower extremity: Secondary | ICD-10-CM | POA: Diagnosis not present

## 2014-06-17 DIAGNOSIS — L97821 Non-pressure chronic ulcer of other part of left lower leg limited to breakdown of skin: Secondary | ICD-10-CM | POA: Diagnosis not present

## 2014-06-17 NOTE — [facility] Oncology Navigator Note (Signed)
At request of Dr. Benay Spice, emailed request to pathology re: Patient: Larry [name], Larry [name] Collected: 02/12/2013 Client: [facility] Accession: [id number] Received: 02/12/2013 Judeth Horn DOB: 01/30/1951 Age: 64 Gender: M Reported: 02/15/2013 [address] Patient Ph: [phone] MRN #: [id number] [place], [place] [address] Visit #: [id number] Chart #:  Needs MLH62methylation testing if not already ordered.

## 2014-06-18 ENCOUNTER — Other Ambulatory Visit (HOSPITAL_COMMUNITY)
Admission: RE | Admit: 2014-06-18 | Discharge: 2014-06-18 | Disposition: A | Discharge status: Home / Self Care | Payer: Medicare Other | Source: Ambulatory Visit | Attending: Oncology | Admitting: Oncology

## 2014-06-18 DIAGNOSIS — C189 Malignant neoplasm of colon, unspecified: Secondary | ICD-10-CM | POA: Insufficient documentation

## 2014-06-24 ENCOUNTER — Encounter (HOSPITAL_BASED_OUTPATIENT_CLINIC_OR_DEPARTMENT_OTHER): Payer: Medicare Other | Attending: General Surgery

## 2014-06-24 ENCOUNTER — Encounter (HOSPITAL_COMMUNITY): Payer: Self-pay | Admitting: Gastroenterology

## 2014-06-24 DIAGNOSIS — Z933 Colostomy status: Secondary | ICD-10-CM | POA: Diagnosis not present

## 2014-06-24 DIAGNOSIS — I739 Peripheral vascular disease, unspecified: Secondary | ICD-10-CM | POA: Diagnosis not present

## 2014-06-24 DIAGNOSIS — J449 Chronic obstructive pulmonary disease, unspecified: Secondary | ICD-10-CM | POA: Insufficient documentation

## 2014-06-24 DIAGNOSIS — L97911 Non-pressure chronic ulcer of unspecified part of right lower leg limited to breakdown of skin: Secondary | ICD-10-CM | POA: Diagnosis not present

## 2014-06-24 DIAGNOSIS — I509 Heart failure, unspecified: Secondary | ICD-10-CM | POA: Insufficient documentation

## 2014-06-24 DIAGNOSIS — I87332 Chronic venous hypertension (idiopathic) with ulcer and inflammation of left lower extremity: Secondary | ICD-10-CM | POA: Diagnosis not present

## 2014-06-24 DIAGNOSIS — I2510 Atherosclerotic heart disease of native coronary artery without angina pectoris: Secondary | ICD-10-CM | POA: Diagnosis not present

## 2014-06-24 DIAGNOSIS — Z87891 Personal history of nicotine dependence: Secondary | ICD-10-CM | POA: Insufficient documentation

## 2014-06-26 ENCOUNTER — Ambulatory Visit (INDEPENDENT_AMBULATORY_CARE_PROVIDER_SITE_OTHER): Payer: Medicare Other | Admitting: Nurse Practitioner

## 2014-06-26 ENCOUNTER — Encounter: Payer: Self-pay | Admitting: Nurse Practitioner

## 2014-06-26 VITALS — BP 118/72 | HR 86 | Ht 70.5 in | Wt 363.0 lb

## 2014-06-26 DIAGNOSIS — I259 Chronic ischemic heart disease, unspecified: Secondary | ICD-10-CM

## 2014-06-26 DIAGNOSIS — I255 Ischemic cardiomyopathy: Secondary | ICD-10-CM | POA: Diagnosis not present

## 2014-06-26 LAB — CBC
HCT: 36.4 % — ABNORMAL LOW (ref 39.0–52.0)
Hemoglobin: 12.4 g/dL — ABNORMAL LOW (ref 13.0–17.0)
MCHC: 33.9 g/dL (ref 30.0–36.0)
MCV: 83.5 fl (ref 78.0–100.0)
Platelets: 153 10*3/uL (ref 150.0–400.0)
RBC: 4.36 Mil/uL (ref 4.22–5.81)
RDW: 15.2 % (ref 11.5–15.5)
WBC: 5.2 10*3/uL (ref 4.0–10.5)

## 2014-06-26 LAB — BASIC METABOLIC PANEL
BUN: 22 mg/dL (ref 6–23)
CO2: 27 mEq/L (ref 19–32)
Calcium: 10 mg/dL (ref 8.4–10.5)
Chloride: 104 mEq/L (ref 96–112)
Creatinine, Ser: 1.67 mg/dL — ABNORMAL HIGH (ref 0.40–1.50)
GFR: 53.99 mL/min — ABNORMAL LOW (ref >60.00)
Glucose, Bld: 94 mg/dL (ref 70–99)
Potassium: 4.2 mEq/L (ref 3.5–5.1)
Sodium: 137 mEq/L (ref 135–145)

## 2014-06-26 NOTE — Progress Notes (Signed)
CARDIOLOGY OFFICE NOTE  Date:  06/26/2014    Larry [name] Date of Birth: 1950/12/24 Medical Record #[id number]  PCP:  Kristine Garbe, MD  Cardiologist:  Allred  Chief Complaint  Patient presents with  . S/P cardiac cath with PCI    Post cath visit - seen for Dr. Burt Knack     History of Present Illness: Larry [name] is a 64 y.o. male who presents today for a post cath visit.  Seen for Dr. Rayann Heman. He has an ischemic CM with severe diffuse 3VD and prior stents to the RCA in 2010 per Dr. Angelena Form, systolic and diastolic dysfunction, HTN, HLD and obesity. Has had a history of atrial tach that has been treated with beta blocker. Was hospitalized back in April of 2013 with a HF exacerbation - had stopped his medicines. Last EF of 40 - to 16% with diastolic dysfunction as well noted in January of 2015.   Seen by me back in October of 2014 - Was out of his medicines again. Had lost weight and was anemic - was sending to GI and ended up having colon cancer.   He was admitted (02/02/2013 - 03/01/2013) to [facility] with subacute history of progressive weakness, constipation and difficulty eating and drinking. He presented with acute renal failure (Creatinine 7.83) and severe anemia (Hbg of 7.3) as well as obstruction. Colonoscopy doing this admission with biopsy revealed adenocarcinoma and patient underwent exploratory lap with partial colectomy with colostomy on 12/23 by Dr. Hulen Skains. Recovery was complicated by wound dehissance, requiring a return to the OR 12/29 with abdominal wound closure with VAC placment. After the procedure, the patient developed atrial tachycardia, so a dilt drip was started (12/29-1/2). On 1/4, the patient had a cardiac arrest, requiring defibrillation, and was subsequently transferred to the ICU. QT was found to be > 700, with mild hypokalemia and hypomagnesemia implicated as the cause. Lidocaine drip was started. Electrolyte abnormality treated and  2D-echo revealed an EF of 40-45% with periapical akinesis and biatrial enlargement. He experienced recurrent torsades on 01/05 and shocked in NSR. By 1/6, QTc had decreased to 499, lidocaine drip was d/c'ed, and patient was transferred out of ICU to stepdown. Then admitted to physical medicine and rehabilitation on 01/09 due to severe deconditioning. He was continued on protein supplement to promote wound healing. His abdominal wound was treated with VAC through 03/05/13. His heart rate was controlled without recurrent arrythmia and was maintained on potassium and magnesium supplements. He was discharged to home on 03/07/2013. He was last evaluated by Dr. Hulen Skains on 02/03 with review of his pathology demonstrating invasive adenocarcinoma of the transverse colon with penetration into the pericolonic fat. He had 0/18 lymph nodes negative for metastatic disease. He was pathologic stage [id number].   Saw Dr. Gwenlyn Found in early July of 2015 for a foot ulcer - basically normal ABI's done.   He was readmitted to the hospital in August of 2015 - triggered by too much salt. Presented with respiratory distress - treated with Bipap and quickly stabilized. Was diuresed about 6 liters as well. Started on low dose ACE. Sleep study recommended.  I have seen back here several times since that hospitalization - last in December - he had improved. Titrated as much medicine as BP could tolerate. Felt to be stable from our standpoint. Referred to Sleep Medicine/Dr. Annamaria Boots and had CPAP for his OSA.   Seen in March - wanting to get his colostomy reversed and needing pre op  clearance. He was feeling pretty good - no real cardiac symptoms noted. Does have a superficial leg ulcer and is being treated by the wound clinic and has seen Dr. Bridgett Larsson for evaluation as well. Had a Myoview to risk stratify - this was high risk and I referred him on for cardiac cath with Dr. Burt Knack.   Comes in today. Here alone. He is doing ok. Never has had chest  pain. Notes that his breathing is a little better. Still with chronic swelling. Seeing the wound clinic as well. No problems with his cath site. No real palpitations or fluttering noted at this time. Did have some atrial tach during his admission.    Past Medical History  Diagnosis Date  . Hypertension   . Hypercholesteremia   . CHF (congestive heart failure)     has diastolic heart failure grade 1; EF is 45 to 50% per echo 05/2011; EF 41% by Myoview 2016  . Coronary artery disease   . COPD (chronic obstructive pulmonary disease)   . Obesity   . Noncompliance   . NSVT (nonsustained ventricular tachycardia)     beta blocker restarted  . Atrial tachycardia     managed on beta blocker therapy  . Colon cancer   . Peripheral arterial disease     small ulcer the head of right metatarsal plantar surface right foot  . Cardiac arrest     X 2 episodes during hospital visit 12'14"electrolyte imbalance"- "Shocked"  . S/P colostomy     2014  . OSA on CPAP     used nightly  . Childhood asthma     "went away after I was 16"  . CKD (chronic kidney disease) stage 3, GFR 30-59 ml/min   . Thrombocytopenia     Past Surgical History  Procedure Laterality Date  . Colonoscopy N/A 02/08/2013    Procedure: COLONOSCOPY;  Surgeon: [name], MD;  Location: [facility];  Service: Endoscopy;  Laterality: N/A;  . Esophagogastroduodenoscopy N/A 02/08/2013    Procedure: ESOPHAGOGASTRODUODENOSCOPY (EGD);  Surgeon: [name], MD;  Location: [facility] ENDOSCOPY;  Service: Endoscopy;  Laterality: N/A;  . Laparotomy N/A 02/12/2013    Procedure: EXPLORATORY LAPAROTOMY PARTIAL COLECTOMY WITH COLOSTOMY;  Surgeon: [name], MD;  Location: [facility];  Service: General;  Laterality: N/A;  . Laparotomy N/A 02/18/2013    Procedure: EXPLORATORY LAPAROTOMY/Closure of Wound;  Surgeon: [name], MD;  Location: [facility];  Service: General;  Laterality: N/A;  . Coronary angioplasty with stent placement  11/12/2008;  06/11/2014    stent x 2 to RCA; stent x 2 to LAD  . Colon surgery    . Left heart catheterization with coronary angiogram N/A 06/11/2014    Procedure: LEFT HEART CATHETERIZATION WITH CORONARY ANGIOGRAM;  Surgeon: [name], MD; CFX calcified, 30-40 percent, RCA calcified, 40/50/40%, PDA diffuse disease, LAD 40/75/90% s/p DES 2   . Coronary stent placement  06/11/2014    m-LAD 3.5 x 16 mm Synergy DES, d-LAD  2.25 x 16 mm Synergy DES  . Colonoscopy with propofol N/A 05/09/2014    Procedure: COLONOSCOPY WITH PROPOFOL;  Surgeon: [name], MD;  Location: [facility] ENDOSCOPY;  Service: Endoscopy;  Laterality: N/A;     Medications: Current Outpatient Prescriptions  Medication Sig Dispense Refill  . aspirin EC 81 MG tablet Take 1 tablet (81 mg total) by mouth daily. 30 tablet 0  . atorvastatin (LIPITOR) 40 MG tablet Take 1 tablet (40 mg total) by mouth daily. (Patient taking differently: Take  40 mg by mouth daily at 6 PM. ) 90 tablet 3  . carvedilol (COREG) 12.5 MG tablet Take 1 tablet (12.5 mg total) by mouth 2 (two) times daily. 180 tablet 3  . clopidogrel (PLAVIX) 75 MG tablet Take 1 tablet (75 mg total) by mouth daily with breakfast. 30 tablet 11  . ferrous sulfate 325 (65 FE) MG tablet Take 1 tablet (325 mg total) by mouth daily with breakfast. 30 tablet 1  . furosemide (LASIX) 40 MG tablet Take 1 tablet (40 mg total) by mouth daily. 30 tablet 11  . KLOR-CON M20 20 MEQ tablet TAKE 1 TABLET TWICE DAILY 60 tablet 3  . levalbuterol (XOPENEX HFA) 45 MCG/ACT inhaler Inhale 2 puffs into the lungs every 4 (four) hours as needed for wheezing. 1 Inhaler 11  . Magnesium 500 MG CAPS Take 500 mg by mouth daily.    . Multiple Vitamins-Minerals (MULTIVITAMIN WITH MINERALS) tablet Take 1 tablet by mouth daily.    . nitroGLYCERIN (NITROSTAT) 0.4 MG SL tablet Place 0.4 mg under the tongue every 5 (five) minutes as needed for chest pain.    . pantoprazole (PROTONIX) 40 MG tablet TAKE 1 TABLET (40 MG TOTAL) BY  MOUTH AT BEDTIME. 30 tablet 10  . ramipril (ALTACE) 1.25 MG capsule TAKE ONE CAPSULE BY MOUTH EVERY 12 HOURS 60 capsule 3  . simethicone (GAS-X) 80 MG chewable tablet Chew 1 tablet (80 mg total) by mouth 4 (four) times daily. 120 tablet 0  . spironolactone (ALDACTONE) 25 MG tablet TAKE 1 TABLET (25 MG TOTAL) BY MOUTH DAILY. 30 tablet 10   No current facility-administered medications for this visit.    Allergies: No Known Allergies  Social History: The patient  reports that he quit smoking about 5 years ago. His smoking use included Cigarettes. He has a 38 pack-year smoking history. He has never used smokeless tobacco. He reports that he drinks alcohol. He reports that he does not use illicit drugs.   Family History: The patient's family history includes Diabetes in his father; Heart disease in his father; Hypertension in his father and mother.   Review of Systems: Please see the history of present illness.   Otherwise, the review of systems is positive for chronic edema.   All other systems are reviewed and negative.   Physical Exam: VS:  BP 118/72 mmHg  Pulse 86  Ht 5' 10.5" (1.791 m)  Wt 363 lb (164.656 kg)  BMI 51.33 kg/m2  SpO2 99% .  BMI Body mass index is 51.33 kg/(m^2).  Wt Readings from Last 3 Encounters:  06/26/14 363 lb (164.656 kg)  06/12/14 363 lb (164.656 kg)  06/09/14 366 lb (166.017 kg)    General: Pleasant. Well developed, well nourished and in no acute distress.  HEENT: Normal. Neck: Supple, no JVD, carotid bruits, or masses noted.  Cardiac: Regular rate and rhythm. Heart tones are distant. No murmurs, rubs, or gallops. No edema.  Respiratory:  Lungs are clear to auscultation bilaterally with normal work of breathing.  GI: Soft and nontender.  MS: No deformity or atrophy. Gait and ROM intact. Skin: Warm and dry. Color is normal.  Neuro:  Strength and sensation are intact and no gross focal deficits noted.  Psych: Alert, appropriate and with normal  affect.   LABORATORY DATA:  EKG:  EKG is not ordered today.  Lab Results  Component Value Date   WBC 4.6 06/12/2014   HGB 11.6* 06/12/2014   HCT 37.5* 06/12/2014   PLT 132* 06/12/2014  GLUCOSE 97 06/12/2014   CHOL 158 10/02/2013   TRIG 117 10/02/2013   HDL 67 10/02/2013   LDLCALC 68 10/02/2013   ALT 19 05/12/2014   AST 17 05/12/2014   NA 137 06/12/2014   K 4.2 06/12/2014   CL 105 06/12/2014   CREATININE 1.66* 06/12/2014   BUN 14 06/12/2014   CO2 25 06/12/2014   TSH 0.70 12/06/2012   INR 1.0 06/09/2014   HGBA1C 6.0 11/26/2009    BNP (last 3 results) No results for input(s): BNP in the last 8760 hours.  ProBNP (last 3 results)  Recent Labs  09/30/13 0736 10/15/13 1117 01/31/14 1012  PROBNP 397.2* 9.0 7.0     Other Studies Reviewed Today:  Procedure: Left Heart Cath, Selective Coronary Angiography, PTCA and stenting of the mid and distal LAD.  Indication: Severe exertional dyspnea, high risk Myoview scan in patient with known CAD. Myoview scan demonstrated large inferolateral scar as well as antero-lateral and anteroapical ischemia suggestive of multivessel coronary artery disease.  Procedural Details: The right wrist was prepped, draped, and anesthetized with 1% lidocaine. Using the modified Seldinger technique, a 5/6 French Slender sheath was introduced into the right radial artery. 3 mg of verapamil was administered through the sheath, weight-based unfractionated heparin was administered intravenously. Standard Judkins catheters were used for selective coronary angiography. Catheter exchanges were performed over an exchange length guidewire.  PROCEDURAL FINDINGS Hemodynamics: AO 102/62 LV 102/6  Coronary angiography: Coronary dominance: right  Left mainstem: The left mainstem is patent. The vessel divides into the LAD and left circumflex. There is no significant stenosis, but there is mild irregularity.  Left anterior descending (LAD): The  LAD is moderately calcified. The vessel is severely diseased. The proximal LAD is patent with 30-40% stenosis after the second septal perforator. There is diffuse calcification. The diagonal branches are small. The mid LAD has an eccentric 75% stenosis at the origin of the second diagonal branch. Beyond that area the mid and distal LAD are diffusely diseased. There is another severe stenosis in the apical LAD of 90%.  Left circumflex (LCx): The left circumflex is calcified. The vessel is patent with mild irregularity. There are no high-grade stenoses identified. There are scattered 30-40% stenoses throughout the proximal and mid circumflex as well as the first OM branch.  Right coronary artery (RCA): The RCA is dominant. The vessel is heavily calcified. The stented segments in the mid and distal RCA are patent. The proximal vessel has 30-40% stenosis. The mid vessel has 30-40% stenosis. The distal vessel is patent with 50-60% stenosis involving the origin of the PDA. The PDA is diffusely diseased.  Left ventriculography: Deferred because of chronic kidney disease. By nuclear scan the LVEF is 41%.  PCI Note: Following the diagnostic procedure, the decision was made to proceed with PCI of the mid and apical LAD. The right coronary artery had patent stents in the left circumflex had nonobstructive disease. The LAD is a diffusely diseased vessel and the mid and distal vessel would be a poor surgical targets. I felt that PCI was the best option in this patient with a high risk nuclear scan demonstrating anteroapical and anterolateral ischemia. He will require colostomy reversal, so I planned on treating him with Synergy drug-eluting stents which had a biodegradable polymer and potentially can allow for earlier interruption of dual antiplatelet therapy. The patient was loaded with Plavix 600 mg. Weight-based bivalirudin was given for anticoagulation. Once a therapeutic ACT was achieved, a 6 [place] XB LAD 3.5 cm  guide catheter was inserted. A cougar coronary guidewire was used to cross the lesion in the apical LAD. The lesion was predilated with a 2.5 x 12 mm balloon. The same balloon was used to dilate the mid lesion. The apical lesion was then stented with a 2.25 x 16 mm Synergy DES. The stent was postdilated with a 2.5 mm noncompliant balloon. The mid lesion was then stented with a 3.5 x 16 mm Synergy DES. That stent was postdilated with a 3.75 mm noncompliant balloon. Following PCI, there was 0% residual stenosis and TIMI-3 flow. Final angiography confirmed an excellent result. The patient tolerated the procedure well. There were no immediate procedural complications. A TR band was used for radial hemostasis. The patient was transferred to the post catheterization recovery area for further monitoring.  PCI Data: Lesion 1: Vessel - LAD/Segment - distal/apical Percent Stenosis (pre) 90 TIMI-flow 3 Stent 2.25 x 16 mm Synergy DES Percent Stenosis (post) 0 TIMI-flow (post) 3  Lesion 2: Vessel - LAD/Segment - mid Percent Stenosis (pre) 75 TIMI-flow 3 Stent 3.5 x 16 mm Synergy DES Percent Stenosis (post) 0 TIMI-flow (post) 3  Estimated Blood Loss: Minimal  Final Conclusions:  1. Two-vessel coronary artery disease with continued patency of the stented segments in the right coronary artery and severe stenoses of the mid and apical LAD 2. Mild diffuse nonobstructive left circumflex stenosis 3. Successful PCI of the LAD using 2 drug-eluting stents   Recommendations:  Dual antiplatelet therapy for at least 6 months. Would be reasonable to consider interruption of aspirin and Plavix at 6 months for colostomy reversal.  [name] MD, [facility] 06/11/2014, 10:06 AM   Myoview Impression April 2016 Exercise Capacity: Lexiscan with no exercise. BP Response: Normal blood pressure response. Clinical Symptoms: There is dyspnea. ECG Impression: No significant ST segment change suggestive of  ischemia. Comparison with Prior Nuclear Study: No images to compare  Overall Impression: High risk stress nuclear study large inferior wall infarct from apex to base. Also suggestion of apical, anterior and lateral wall ischemia.  LV Ejection Fraction: 41%. LV Wall Motion: Inferiior akinesis Consistent with possible 3 VD Consider cath before major surgery   [place]   Echo Study Conclusions from January 2015  - Left ventricle: The cavity size was mildly dilated. Wall thickness was increased in a pattern of mild LVH. Systolic function was mildly to moderately reduced. The estimated ejection fraction was in the range of 40% to 45%. There was akinesis of the apical lateral, apical anterior, and apical septal segments. There was akinesis of the true apex. Prominent trabeculation at the apex but I do not think thrombus was present. Patient had Definity study recently to look at the apex and no thrombus was seen. Doppler parameters are consistent with abnormal left ventricular relaxation (grade 1 diastolic dysfunction). - Aortic valve: Trileaflet; moderately calcified leaflets. There was no stenosis. - Mitral valve: Moderately calcified annulus. Mildly calcified leaflets . Trivial regurgitation. - Left atrium: The atrium was moderately dilated. - Right ventricle: The cavity size was normal. Systolic function was normal. - Right atrium: The atrium was mildly dilated. - Pulmonary arteries: No complete TR doppler jet so unable to estimate PA systolic pressure. - Systemic veins: IVC was not visualized. - Pericardium, extracardiac: A trivial pericardial effusion was identified. Impressions:  - Mildly dilated LV with mild LV hypertrophy. EF 40-45% with peri-apical akinesis. I do not think that LV apical thrombus is present. Normal RV size and systolic function. Biatrial enlargement.   Assessment /  Plan:  1. S/P cath with PCI following high risk Myoview - has had DES x 2  to the LAD - on aspirin/Plavix. Doing well.  Recheck BMET and CBC today. I will see back in August.   2. Combined systolic and diastolic HF - he has chronic issues with his volume. Looks stable.   3. CAD with prior PCI   4. Colon cancer - followed by oncology - hopeful to have his colostomy reversed. He tells me he is ok to wait the full 12 months. May consider proceeding in 6 months. For now, his plans are on hold.   5. HTN - stable - BP limiting how much HF meds he can have.   5. CKD - on very low dose ACE   6. OSA - now on CPAP and followed by Dr. Annamaria Boots  7. HLD - on statin  8. Atrial tachycardia - beta blocker increased at last visit.   Current medicines are reviewed with the patient today.  The patient does not have concerns regarding medicines other than what has been noted above.  The following changes have been made:  See above.  Labs/ tests ordered today include:   No orders of the defined types were placed in this encounter.     Disposition:   FU with me in 3 months. I am going to see if Dr. Burt Knack will follow with me.   Patient is agreeable to this plan and will call if any problems develop in the interim.   Signed: Burtis Junes, RN, ANP-C 06/26/2014 2:37 PM  [facility] [address] [place], [place]  [address] Phone: [phone] Fax: [phone]

## 2014-06-26 NOTE — Patient Instructions (Signed)
We will be checking the following labs today - BMET & CBC   Medication Instructions:    Continue with your current medicines.     Testing/Procedures To Be Arranged:  N/A  Follow-Up:   We will see you back in 3 months    Other Special Instructions:   Try to increase your walking as we talked about today  Call the [place] office at [phone] if you have any questions, problems or concerns.

## 2014-06-30 ENCOUNTER — Encounter (HOSPITAL_COMMUNITY): Payer: Self-pay

## 2014-07-01 DIAGNOSIS — I739 Peripheral vascular disease, unspecified: Secondary | ICD-10-CM | POA: Diagnosis not present

## 2014-07-01 DIAGNOSIS — Z933 Colostomy status: Secondary | ICD-10-CM | POA: Diagnosis not present

## 2014-07-01 DIAGNOSIS — I87332 Chronic venous hypertension (idiopathic) with ulcer and inflammation of left lower extremity: Secondary | ICD-10-CM | POA: Diagnosis not present

## 2014-07-01 DIAGNOSIS — L97911 Non-pressure chronic ulcer of unspecified part of right lower leg limited to breakdown of skin: Secondary | ICD-10-CM | POA: Diagnosis not present

## 2014-07-01 DIAGNOSIS — J449 Chronic obstructive pulmonary disease, unspecified: Secondary | ICD-10-CM | POA: Diagnosis not present

## 2014-07-01 DIAGNOSIS — Z87891 Personal history of nicotine dependence: Secondary | ICD-10-CM | POA: Diagnosis not present

## 2014-07-05 ENCOUNTER — Other Ambulatory Visit: Payer: Self-pay | Admitting: Nurse Practitioner

## 2014-07-05 ENCOUNTER — Other Ambulatory Visit: Payer: Self-pay | Admitting: Internal Medicine

## 2014-07-08 ENCOUNTER — Other Ambulatory Visit: Payer: Self-pay

## 2014-07-08 ENCOUNTER — Encounter: Payer: Self-pay | Admitting: *Deleted

## 2014-07-08 ENCOUNTER — Telehealth: Payer: Self-pay | Admitting: *Deleted

## 2014-07-08 DIAGNOSIS — I87332 Chronic venous hypertension (idiopathic) with ulcer and inflammation of left lower extremity: Secondary | ICD-10-CM | POA: Diagnosis not present

## 2014-07-08 DIAGNOSIS — L97911 Non-pressure chronic ulcer of unspecified part of right lower leg limited to breakdown of skin: Secondary | ICD-10-CM | POA: Diagnosis not present

## 2014-07-08 DIAGNOSIS — Z933 Colostomy status: Secondary | ICD-10-CM | POA: Diagnosis not present

## 2014-07-08 DIAGNOSIS — I739 Peripheral vascular disease, unspecified: Secondary | ICD-10-CM | POA: Diagnosis not present

## 2014-07-08 DIAGNOSIS — Z87891 Personal history of nicotine dependence: Secondary | ICD-10-CM | POA: Diagnosis not present

## 2014-07-08 DIAGNOSIS — J449 Chronic obstructive pulmonary disease, unspecified: Secondary | ICD-10-CM | POA: Diagnosis not present

## 2014-07-08 NOTE — Telephone Encounter (Signed)
Called both patient and pharmacy to verify refills for Spironolactone, Pantoprazole, furosemide, and Carvedilol. Patient states he is currently taking 12.5 mg of Carvedilol twice daily by mouth and was notified by his pharmacy he has other rx to pick up. Pharmacy states they did receive rx for Furosemide, Pantoprazole, and Spironolactone and had both Carvedilol 6.59my 1.5 tabs twice daily and Carvedilol 12.5 twice daily to refill. I let the pharmacy know pt is taking 12.5 mg twice daily, dose was recently increased. Epic stated transmission error but [facility] did receive the other 3 rx at the same time.  I went in pt's chart and "cleaned" his medication list of duplicates, non-transmitted rx, and changed doses.  Pt verbally agreed all 4 rx were correct. Pharmacy agreed pt is picking up all 4 rx as scheduled. Everyone was appreciative for the calls to clear up any misguided rx.

## 2014-07-10 ENCOUNTER — Encounter: Payer: Self-pay | Admitting: Vascular Surgery

## 2014-07-11 ENCOUNTER — Encounter: Payer: Self-pay | Admitting: Vascular Surgery

## 2014-07-11 ENCOUNTER — Ambulatory Visit: Payer: Medicare Other | Admitting: Vascular Surgery

## 2014-07-11 VITALS — BP 125/70 | HR 94 | Ht 70.5 in | Wt 364.0 lb

## 2014-07-11 DIAGNOSIS — L97329 Non-pressure chronic ulcer of left ankle with unspecified severity: Secondary | ICD-10-CM

## 2014-07-11 DIAGNOSIS — Z48812 Encounter for surgical aftercare following surgery on the circulatory system: Secondary | ICD-10-CM

## 2014-07-11 DIAGNOSIS — I872 Venous insufficiency (chronic) (peripheral): Secondary | ICD-10-CM

## 2014-07-11 DIAGNOSIS — I83023 Varicose veins of left lower extremity with ulcer of ankle: Secondary | ICD-10-CM

## 2014-07-11 NOTE — Progress Notes (Signed)
    Established Venous Insufficiency  History of Present Illness  Larry [name] is a 64 y.o. (Mar 27, 1950) male who presents with chief complaint: Left leg ulcer.  The patient is under wound care at [facility].  The left lateral leg ulcer is nearly healed.  The right leg is in compression and they will be starting his left leg in compression this coming week.  The patient denies any intermittent claudication or rest pain at this point.  The patient's PMH, PSH, SH, FamHx, Med, and Allergies are unchanged from 06/06/14.  On ROS today: no further drainage from L leg, no fever or chills.  Physical Examination  Filed Vitals:   07/11/14 1452  BP: 125/70  Pulse: 94  Height: 5' 10.5" (1.791 m)  Weight: 364 lb (165.109 kg)  SpO2: 98%   Body mass index is 51.47 kg/(m^2).  General: A&O x 3, WD, Obese,   Pulmonary: Sym exp, good air movt, CTAB, no rales, rhonchi, & wheezing  Cardiac: RRR, Nl S1, S2, no Murmurs, rubs or gallops  Vascular: Vessel Right Left  Radial Palpable Palpable  Brachial Palpable Palpable  Carotid Palpable, without bruit Palpable, without bruit  Aorta Not palpable N/A  Femoral Palpable Palpable  Popliteal Not palpable Not palpable  PT Not Palpable Not Palpable  DP Not Palpable Not Palpable   Gastrointestinal: soft, NTND, -G/R, - HSM, - masses, - CVAT B  Musculoskeletal: M/S 5/5 throughout , Extremities without ischemic changes , L lower leg lateral ulcer nearly healed, BLE edema 2+  Neurologic: Pain and light touch intact in extremities , Motor exam as listed above   Medical Decision Making  Larry [name] is a 64 y.o. male who presents with: LLE ulcer likely venous etiology, chronic venous insufficiency (C6), LLE PAD   The patient is already in compression therapy on the RLE and will be starting on the left soon.  Due to body habitus and swelling, her prior studies were inaccurate.  I would repeat the ABI in 6 months to get a better evaluation of his  bilateral arterial disease.  Thank you for allowing [place] to participate in this patient's care.  Adele Barthel, MD [facility] Office: [phone] Pager: [phone]  07/11/2014, 3:02 PM

## 2014-07-11 NOTE — Addendum Note (Signed)
Addended by: Dorthula Rue L on: 07/11/2014 03:55 PM   Modules accepted: Orders

## 2014-07-15 DIAGNOSIS — L97911 Non-pressure chronic ulcer of unspecified part of right lower leg limited to breakdown of skin: Secondary | ICD-10-CM | POA: Diagnosis not present

## 2014-07-15 DIAGNOSIS — I87332 Chronic venous hypertension (idiopathic) with ulcer and inflammation of left lower extremity: Secondary | ICD-10-CM | POA: Diagnosis not present

## 2014-07-15 DIAGNOSIS — Z87891 Personal history of nicotine dependence: Secondary | ICD-10-CM | POA: Diagnosis not present

## 2014-07-15 DIAGNOSIS — I739 Peripheral vascular disease, unspecified: Secondary | ICD-10-CM | POA: Diagnosis not present

## 2014-07-15 DIAGNOSIS — Z933 Colostomy status: Secondary | ICD-10-CM | POA: Diagnosis not present

## 2014-07-15 DIAGNOSIS — J449 Chronic obstructive pulmonary disease, unspecified: Secondary | ICD-10-CM | POA: Diagnosis not present

## 2014-07-22 ENCOUNTER — Telehealth: Payer: Self-pay | Admitting: *Deleted

## 2014-07-22 DIAGNOSIS — L97911 Non-pressure chronic ulcer of unspecified part of right lower leg limited to breakdown of skin: Secondary | ICD-10-CM | POA: Diagnosis not present

## 2014-07-22 DIAGNOSIS — I739 Peripheral vascular disease, unspecified: Secondary | ICD-10-CM | POA: Diagnosis not present

## 2014-07-22 DIAGNOSIS — C189 Malignant neoplasm of colon, unspecified: Secondary | ICD-10-CM

## 2014-07-22 DIAGNOSIS — Z933 Colostomy status: Secondary | ICD-10-CM | POA: Diagnosis not present

## 2014-07-22 DIAGNOSIS — J449 Chronic obstructive pulmonary disease, unspecified: Secondary | ICD-10-CM | POA: Diagnosis not present

## 2014-07-22 DIAGNOSIS — Z87891 Personal history of nicotine dependence: Secondary | ICD-10-CM | POA: Diagnosis not present

## 2014-07-22 DIAGNOSIS — I87332 Chronic venous hypertension (idiopathic) with ulcer and inflammation of left lower extremity: Secondary | ICD-10-CM | POA: Diagnosis not present

## 2014-07-22 NOTE — Telephone Encounter (Signed)
Left message on voicemail for pt to call office. Dr. Benay Spice has made referral to genetics clinic. Per MD: will likely need MLH1 mutation testing.

## 2014-07-23 ENCOUNTER — Telehealth: Payer: Self-pay | Admitting: Genetic Counselor

## 2014-07-23 NOTE — Telephone Encounter (Signed)
Called pt and scheduled gen counseling appt.   Larry [name] 07/30/14 10 am  Dx: Colon Cancer Referring: Dr. Benay Spice

## 2014-07-29 ENCOUNTER — Encounter (HOSPITAL_BASED_OUTPATIENT_CLINIC_OR_DEPARTMENT_OTHER): Payer: Medicare Other | Attending: General Surgery

## 2014-07-29 DIAGNOSIS — I890 Lymphedema, not elsewhere classified: Secondary | ICD-10-CM | POA: Insufficient documentation

## 2014-07-29 DIAGNOSIS — I872 Venous insufficiency (chronic) (peripheral): Secondary | ICD-10-CM | POA: Insufficient documentation

## 2014-07-29 DIAGNOSIS — L97821 Non-pressure chronic ulcer of other part of left lower leg limited to breakdown of skin: Secondary | ICD-10-CM | POA: Insufficient documentation

## 2014-07-29 DIAGNOSIS — J449 Chronic obstructive pulmonary disease, unspecified: Secondary | ICD-10-CM | POA: Insufficient documentation

## 2014-07-29 DIAGNOSIS — J45909 Unspecified asthma, uncomplicated: Secondary | ICD-10-CM | POA: Insufficient documentation

## 2014-07-29 DIAGNOSIS — Z859 Personal history of malignant neoplasm, unspecified: Secondary | ICD-10-CM | POA: Diagnosis not present

## 2014-07-29 DIAGNOSIS — I2510 Atherosclerotic heart disease of native coronary artery without angina pectoris: Secondary | ICD-10-CM | POA: Insufficient documentation

## 2014-07-29 DIAGNOSIS — Z87891 Personal history of nicotine dependence: Secondary | ICD-10-CM | POA: Diagnosis not present

## 2014-07-29 DIAGNOSIS — L97811 Non-pressure chronic ulcer of other part of right lower leg limited to breakdown of skin: Secondary | ICD-10-CM | POA: Diagnosis not present

## 2014-07-29 DIAGNOSIS — I739 Peripheral vascular disease, unspecified: Secondary | ICD-10-CM | POA: Insufficient documentation

## 2014-07-29 DIAGNOSIS — I509 Heart failure, unspecified: Secondary | ICD-10-CM | POA: Diagnosis not present

## 2014-07-30 ENCOUNTER — Encounter: Payer: Self-pay | Admitting: Genetic Counselor

## 2014-07-30 ENCOUNTER — Other Ambulatory Visit: Payer: Medicare Other

## 2014-07-30 ENCOUNTER — Ambulatory Visit (HOSPITAL_BASED_OUTPATIENT_CLINIC_OR_DEPARTMENT_OTHER): Payer: Medicare Other | Admitting: Genetic Counselor

## 2014-07-30 DIAGNOSIS — C189 Malignant neoplasm of colon, unspecified: Secondary | ICD-10-CM

## 2014-07-30 DIAGNOSIS — Z8042 Family history of malignant neoplasm of prostate: Secondary | ICD-10-CM | POA: Diagnosis not present

## 2014-07-30 DIAGNOSIS — Z809 Family history of malignant neoplasm, unspecified: Secondary | ICD-10-CM

## 2014-07-30 DIAGNOSIS — Z8041 Family history of malignant neoplasm of ovary: Secondary | ICD-10-CM | POA: Insufficient documentation

## 2014-07-30 DIAGNOSIS — C184 Malignant neoplasm of transverse colon: Secondary | ICD-10-CM

## 2014-07-30 NOTE — Progress Notes (Signed)
REFERRING PROVIDER: Lin Landsman, MD [phone] [place], [place] [address]   Larry Coder, MD  PRIMARY PROVIDER:  Kristine Garbe, MD  PRIMARY REASON FOR VISIT:  1. Colon cancer   2. Family history of ovarian cancer      HISTORY OF PRESENT ILLNESS:   Larry [name], a 64 y.o. male, was seen for a [place] cancer genetics consultation at the request of Larry [name] due to a personal and family history of cancer.  Larry [name] presents to clinic today to discuss the possibility of a hereditary predisposition to cancer, genetic testing, and to further clarify his future cancer risks, as well as potential cancer risks for family members.   In 2014, at the age of 74, Larry [name] was diagnosed with cancer of the transverse colon. This was treated with surgery.  Tumor testing was performed, including MSI (high), IHC (loss of MLH1 and PMS2), BRAF (negative) and methylation studies (negative).  Tumor testing is suggestive of Lynch syndrome.    CANCER HISTORY:   No history exists.     RISK FACTORS:  Colonoscopy: yes; 2 colon polyps on latest colonoscopy. MSI: high IHC: Loss of MLH1/PMS2 BRAF: negative Methylation: negative Polyps: 2 Former smoker: 37 years, quit 2010 Has not drank alcohol for 2 years.  Past Medical History  Diagnosis Date  . Hypertension   . Hypercholesteremia   . CHF (congestive heart failure)     has diastolic heart failure grade 1; EF is 45 to 50% per echo 05/2011; EF 41% by Myoview 2016  . Coronary artery disease   . COPD (chronic obstructive pulmonary disease)   . Obesity   . Noncompliance   . NSVT (nonsustained ventricular tachycardia)     beta blocker restarted  . Atrial tachycardia     managed on beta blocker therapy  . Peripheral arterial disease     small ulcer the head of right metatarsal plantar surface right foot  . Cardiac arrest     X 2 episodes during hospital visit 12'14"electrolyte imbalance"- "Shocked"  . S/P colostomy    2014  . OSA on CPAP     used nightly  . Childhood asthma     "went away after I was 16"  . CKD (chronic kidney disease) stage 3, GFR 30-59 ml/min   . Thrombocytopenia   . Family history of ovarian cancer   . Colon cancer     MSI high; IHC loss of MLH1 and PMS2; BRAF negative; Negative methylation    Past Surgical History  Procedure Laterality Date  . Colonoscopy N/A 02/08/2013    Procedure: COLONOSCOPY;  Surgeon: Larry Beams, MD;  Location: [facility];  Service: Endoscopy;  Laterality: N/A;  . Esophagogastroduodenoscopy N/A 02/08/2013    Procedure: ESOPHAGOGASTRODUODENOSCOPY (EGD);  Surgeon: Larry Beams, MD;  [facility] END[facility]COPY;  Service: Endoscopy;  Laterality: N/A;  . Laparotomy N/A 02/12/2013    Procedure: EXPLORATORY LAPAROTOMY PARTIAL COLECTOMY WITH COLOSTOMY;  Surgeon: Larry Ober, MD;  Location: [facility];  Service: General;  Laterality: N/A;  . Laparotomy N/A 02/18/2013    Procedure: EXPLORATORY LAPAROTOMY/Closure of Wound;  Surgeon: Larry Ok, MD;  Location: [facility];  Service: General;  Laterality: N/A;  . Coronary angioplasty with stent placement  11/12/2008; 06/11/2014    stent x 2 to RCA; stent x 2 to LAD  . Colon surgery    . Left heart catheterization with coronary angiogram N/A 06/11/2014    Procedure: LEFT HEART CATHETERIZATION WITH CORONARY ANGIOGRAM;  Surgeon: Larry Mocha, MD; CFX calcified, 30-40 percent, RCA calcified, 40/50/40%, PDA diffuse disease, LAD 40/75/90% s/p DES 2   . Coronary stent placement  06/11/2014    m-LAD 3.5 x 16 mm Synergy DES, d-LAD  2.25 x 16 mm Synergy DES  . Colonoscopy with propofol N/A 05/09/2014    Procedure: COLONOSCOPY WITH PROPOFOL;  Surgeon: Larry Ada, MD;  Location: [facility] ENDOSCOPY;  Service: Endoscopy;  Laterality: N/A;    History   Social History  . Marital Status: Married    Spouse Name: Larry [name]  . Number of Children: 1  . Years of Education: N/A   Social History Main Topics  . Smoking status: Former Smoker --  1.00 packs/day for 38 years    Types: Cigarettes    Quit date: 07/15/2008  . Smokeless tobacco: Never Used  . Alcohol Use: 0.0 oz/week    0 Standard drinks or equivalent per week     Comment: 06/11/2014 "I rarely have a drink; last drink was New Years; if I wanted a drink I would"  . Drug Use: No  . Sexual Activity: Not Currently   Other Topics Concern  . None   Social History Narrative     FAMILY HISTORY:  We obtained a detailed, 4-generation family history.  Significant diagnoses are listed below: Family History  Problem Relation Age of Onset  . Hypertension Father   . Heart disease Father     before age 38  . Diabetes Father   . Hypertension Mother   . Dementia Mother   . Parkinsonism Mother   . Prostate cancer Maternal Uncle   . Ovarian cancer Maternal Grandmother     dx in her 29s  . Cancer Maternal Uncle    The patient has one son who is healthy.  He has one sister who is healhty.  His father died from complications of diabetes at age 41.  HE has two brothers and a sister.  His brother who was a smoker had lung cancer.  Larry [name] mother died at 14 from complications of parkinson's disease and dementia.  She had four sisters and five brothers.  One brother had prostate cancer and another brother had Cancer NOS.  Larry [name] maternal grandmother had ovarian cancer and died at 20. Patient's maternal ancestors are of African American descent, and paternal ancestors are of African American descent. There is no reported Ashkenazi Jewish ancestry. There is no known consanguinity.  GENETIC COUNSELING ASSESSMENT: Larry [name] is a 64 y.o. male with a personal and family history of cancer, and tumor testing which somewhat suggestive of a Lynch syndrome and predisposition to cancer. We, therefore, discussed and recommended the following at today's visit.   DISCUSSION: We reviewed the characteristics, features and inheritance patterns of hereditary cancer syndromes. We  discussed that his tumor testing, along side his personal and family history is suggestive of Lynch syndrome.  We reviewed the hereditary nature of Lynch syndrome and that his son and sister may need to be tested if his test is positive.  We also discussed genetic testing, including the appropriate family members to test, the process of testing, insurance coverage and turn-around-time for results. We discussed the implications of a negative, positive and/or variant of uncertain significant result. We recommended Larry [name] pursue genetic testing for the Lynch/High risk gene panel. The Lynch/Colorectal high risk panel offered by [facility] performs sequencing and deletion/duplicaton testing on the following 7 genes: APC, EPCAM, MLH1, MSH2, MSH6, MUTYH and PMS2.  Based on Larry [name] personal and family history of cancer, he meets medical criteria for genetic testing. Despite that he meets criteria, he may still have an out of pocket cost. We discussed that if his out of pocket cost for testing is over $100, the laboratory will call and confirm whether he wants to proceed with testing.  If the out of pocket cost of testing is less than $100 he will be billed by the genetic testing laboratory.   PLAN: After considering the risks, benefits, and limitations, Larry [name]  provided informed consent to pursue genetic testing and the blood sample was sent to [facility] for analysis of the Lynch/High Risk panel. Results should be available within approximately 2-3 weeks' time, at which point they will be disclosed by telephone to Larry [name], as will any additional recommendations warranted by these results. Larry [name] will receive a summary of his genetic counseling visit and a copy of his results once available. This information will also be available in Epic. We encouraged Larry [name] to remain in contact with cancer genetics annually so that we can continuously update the family history and inform  him of any changes in cancer genetics and testing that may be of benefit for his family. Larry [name] questions were answered to his satisfaction today. Our contact information was provided should additional questions or concerns arise.  Lastly, we encouraged Larry [name] to remain in contact with cancer genetics annually so that we can continuously update the family history and inform him of any changes in cancer genetics and testing that may be of benefit for this family.   Mr.  [name] questions were answered to his satisfaction today. Our contact information was provided should additional questions or concerns arise. Thank you for the referral and allowing [place] to share in the care of your patient.   Larry [name], Media, [facility] Larry [name] Santiago [email or web address] phone: [phone]  The patient was seen for a total of 60 minutes in face-to-face genetic counseling.  This patient was discussed with Drs. Magrinat, Lindi Adie and/or Burr Medico who agrees with the above.    _______________________________________________________________________ For Office Staff:  Number of people involved in session: 2 Was an Intern/ student involved with case: yes

## 2014-07-31 ENCOUNTER — Encounter: Payer: Self-pay | Admitting: Genetic Counselor

## 2014-07-31 ENCOUNTER — Telehealth: Payer: Self-pay | Admitting: Genetic Counselor

## 2014-07-31 NOTE — Telephone Encounter (Signed)
Larry [name] called to update the family history.  [name]'s maternal grandmother had liver cancer.  She had been having problems with her ovaries but actually had liver cancer.  His mother's brother, [name], had leukemia.  I will update the FH.

## 2014-08-04 DIAGNOSIS — C189 Malignant neoplasm of colon, unspecified: Secondary | ICD-10-CM | POA: Diagnosis not present

## 2014-08-05 DIAGNOSIS — L97821 Non-pressure chronic ulcer of other part of left lower leg limited to breakdown of skin: Secondary | ICD-10-CM | POA: Diagnosis not present

## 2014-08-05 DIAGNOSIS — I739 Peripheral vascular disease, unspecified: Secondary | ICD-10-CM | POA: Diagnosis not present

## 2014-08-05 DIAGNOSIS — L97811 Non-pressure chronic ulcer of other part of right lower leg limited to breakdown of skin: Secondary | ICD-10-CM | POA: Diagnosis not present

## 2014-08-05 DIAGNOSIS — I509 Heart failure, unspecified: Secondary | ICD-10-CM | POA: Diagnosis not present

## 2014-08-05 DIAGNOSIS — I872 Venous insufficiency (chronic) (peripheral): Secondary | ICD-10-CM | POA: Diagnosis not present

## 2014-08-05 DIAGNOSIS — I890 Lymphedema, not elsewhere classified: Secondary | ICD-10-CM | POA: Diagnosis not present

## 2014-08-07 ENCOUNTER — Other Ambulatory Visit: Payer: Self-pay | Admitting: Internal Medicine

## 2014-08-12 DIAGNOSIS — I509 Heart failure, unspecified: Secondary | ICD-10-CM | POA: Diagnosis not present

## 2014-08-12 DIAGNOSIS — I890 Lymphedema, not elsewhere classified: Secondary | ICD-10-CM | POA: Diagnosis not present

## 2014-08-12 DIAGNOSIS — L97821 Non-pressure chronic ulcer of other part of left lower leg limited to breakdown of skin: Secondary | ICD-10-CM | POA: Diagnosis not present

## 2014-08-12 DIAGNOSIS — I872 Venous insufficiency (chronic) (peripheral): Secondary | ICD-10-CM | POA: Diagnosis not present

## 2014-08-12 DIAGNOSIS — I739 Peripheral vascular disease, unspecified: Secondary | ICD-10-CM | POA: Diagnosis not present

## 2014-08-12 DIAGNOSIS — L97811 Non-pressure chronic ulcer of other part of right lower leg limited to breakdown of skin: Secondary | ICD-10-CM | POA: Diagnosis not present

## 2014-08-19 DIAGNOSIS — I739 Peripheral vascular disease, unspecified: Secondary | ICD-10-CM | POA: Diagnosis not present

## 2014-08-19 DIAGNOSIS — I872 Venous insufficiency (chronic) (peripheral): Secondary | ICD-10-CM | POA: Diagnosis not present

## 2014-08-19 DIAGNOSIS — I509 Heart failure, unspecified: Secondary | ICD-10-CM | POA: Diagnosis not present

## 2014-08-19 DIAGNOSIS — L97811 Non-pressure chronic ulcer of other part of right lower leg limited to breakdown of skin: Secondary | ICD-10-CM | POA: Diagnosis not present

## 2014-08-19 DIAGNOSIS — L97821 Non-pressure chronic ulcer of other part of left lower leg limited to breakdown of skin: Secondary | ICD-10-CM | POA: Diagnosis not present

## 2014-08-19 DIAGNOSIS — I890 Lymphedema, not elsewhere classified: Secondary | ICD-10-CM | POA: Diagnosis not present

## 2014-08-26 ENCOUNTER — Encounter (HOSPITAL_BASED_OUTPATIENT_CLINIC_OR_DEPARTMENT_OTHER): Payer: Medicare Other | Attending: General Surgery

## 2014-08-26 DIAGNOSIS — I739 Peripheral vascular disease, unspecified: Secondary | ICD-10-CM | POA: Insufficient documentation

## 2014-08-26 DIAGNOSIS — I87332 Chronic venous hypertension (idiopathic) with ulcer and inflammation of left lower extremity: Secondary | ICD-10-CM | POA: Diagnosis not present

## 2014-08-26 DIAGNOSIS — G4730 Sleep apnea, unspecified: Secondary | ICD-10-CM | POA: Insufficient documentation

## 2014-08-26 DIAGNOSIS — C187 Malignant neoplasm of sigmoid colon: Secondary | ICD-10-CM | POA: Diagnosis not present

## 2014-08-26 DIAGNOSIS — I10 Essential (primary) hypertension: Secondary | ICD-10-CM | POA: Diagnosis not present

## 2014-08-26 DIAGNOSIS — I5042 Chronic combined systolic (congestive) and diastolic (congestive) heart failure: Secondary | ICD-10-CM | POA: Diagnosis not present

## 2014-08-26 DIAGNOSIS — I2510 Atherosclerotic heart disease of native coronary artery without angina pectoris: Secondary | ICD-10-CM | POA: Diagnosis not present

## 2014-08-26 DIAGNOSIS — J449 Chronic obstructive pulmonary disease, unspecified: Secondary | ICD-10-CM | POA: Insufficient documentation

## 2014-08-26 DIAGNOSIS — I509 Heart failure, unspecified: Secondary | ICD-10-CM | POA: Diagnosis not present

## 2014-08-27 ENCOUNTER — Telehealth: Payer: Self-pay | Admitting: Genetic Counselor

## 2014-08-27 NOTE — Telephone Encounter (Signed)
Left a message and spoke with Larry [name] wife, Larry [name].  Informed her that I had some test results for him.  Please call me back at his convenience.

## 2014-08-27 NOTE — Telephone Encounter (Signed)
Discussed with Larry [name] that his genetic test results were negative for changes within any of 7 genes causing Lynch syndrome and/or increased risks for colorectal cancer.  A variant of uncertain significance (VUS) was identified in one of these genes.  Eventually this will be reclassified; in the meantime we treat this VUS as a negative test result.  Thus, based on this result, we would not expect Larry [name] to be at an increased risk for colon or related cancers.  I will speak with his genetic counselor, Roma Kayser, to determine whether she has any additional genetic testing which she would recommend for him.  Otherwise, future cancer screening would then be based upon the personal and family history of cancer, and he would follow screening guidelines as recommended by his oncologist and primary care providers.

## 2014-09-05 ENCOUNTER — Other Ambulatory Visit: Payer: Self-pay | Admitting: Internal Medicine

## 2014-09-18 ENCOUNTER — Ambulatory Visit (INDEPENDENT_AMBULATORY_CARE_PROVIDER_SITE_OTHER): Payer: Medicare Other | Admitting: Internal Medicine

## 2014-09-18 ENCOUNTER — Encounter: Payer: Self-pay | Admitting: Internal Medicine

## 2014-09-18 VITALS — BP 118/82 | HR 85 | Ht 71.0 in | Wt 355.0 lb

## 2014-09-18 DIAGNOSIS — G4733 Obstructive sleep apnea (adult) (pediatric): Secondary | ICD-10-CM | POA: Diagnosis not present

## 2014-09-18 DIAGNOSIS — E669 Obesity, unspecified: Secondary | ICD-10-CM | POA: Diagnosis not present

## 2014-09-18 DIAGNOSIS — I259 Chronic ischemic heart disease, unspecified: Secondary | ICD-10-CM | POA: Diagnosis not present

## 2014-09-18 NOTE — Patient Instructions (Signed)
You are doing very well now with your CPAP and we can continue, set on Auto 10-20/ [facility]  Please call [facility] and [place] as needed

## 2014-09-18 NOTE — Progress Notes (Signed)
03/18/14- 63 yoM Referred by Truitt Merle NP/ Cardiology; stops breathing at night when he is sleeping.  Sleep Study results. Epworth Score: 3 Followed by oncology for adenoCA colon He hopes for clearance for reversal of colostomy. NPSG- 12/04/13- AHI 24.6/ hr, CPAP to 14, weight  348 lbs Wife tells him he snores loudly. Admits some daytime tiredness. No sleep medicines or caffeine. No ENT surgery. Denies history of lung disease but has COPD and his problem list. Ischemic cardiomyopathy, hypertension, history of congestive heart failure. Bedtime between 11 and 12 midnight, sleep latency one half hour, waking 2 or 3 times before up between 7 and 8 AM. Weight gain 35 pounds in the past 2 years.  05/19/14- 64 yoM followed for OSA complicated by COPD,  Ischemic cardiomyopathy, hypertension, history of congestive heart failure, colon cancer FOLLOWS FOR states wearing cpap auto 10-20 every night for about 5-6 hours. No complaints voiced about machine. States pressure and equipment working fine.   09/18/14- 75 yoM followed for OSA complicated by COPD,  Ischemic cardiomyopathy, hypertension, history of congestive heart failure, colon cancer Reports Doing well with CPAP auto 10-20/[facility] - wear 6-8 hours each night, DME - [facility] Download confirms good compliance and control. Nasal pillows mask.  ROS-see HPI Constitutional:   No-   weight loss, night sweats, fevers, chills, fatigue, lassitude. HEENT:   No-  headaches, difficulty swallowing, tooth/dental problems, sore throat,       No-  sneezing, itching, ear ache, nasal congestion, post nasal drip,  CV:  No-   chest pain, orthopnea, PND, swelling in lower extremities, anasarca,                                                     dizziness, palpitations Resp: +  shortness of breath with exertion or at rest.              No-   productive cough,  No non-productive cough,  No- coughing up of blood.              No-   change in color of mucus.  No- wheezing.    Skin: No-   rash or lesions. GI:  No-   heartburn, indigestion, abdominal pain, nausea, vomiting,  GU:  MS:  No-   joint pain or swelling.   Neuro-     nothing unusual Psych:  No- change in mood or affect. No depression or anxiety.  No memory loss.  OBJ- Physical Exam  big man, + overweight General- Alert, Oriented, Affect-appropriate, Distress- none acute Skin- rash-none, lesions- none, excoriation- none Lymphadenopathy- none Head- atraumatic            Eyes- Gross vision intact, PERRLA, conjunctivae and secretions clear            Ears- Hearing, canals-normal            Nose- Clear, no-Septal dev, mucus, polyps, erosion, perforation             Throat- Mallampati III-IV , mucosa clear , drainage- none, tonsils- atrophic Neck- flexible , trachea midline, no stridor , thyroid nl, carotid no bruit Chest - symmetrical excursion , unlabored           Heart/CV- RRR , no murmur , no gallop  , no rub, nl s1 s2                           -  JVD- none , edema- none, stasis changes- none, varices- none           Lung- clear to P&A, wheeze- none, cough- none , dullness-none, rub- none           Chest wall-  Abd- +right colostomy Br/ Gen/ Rectal- Not done, not indicated Extrem- cyanosis- none, clubbing, none, atrophy- none, strength- nl Neuro- grossly intact to observation

## 2014-09-24 ENCOUNTER — Encounter: Payer: Self-pay | Admitting: Nurse Practitioner

## 2014-09-24 ENCOUNTER — Ambulatory Visit (INDEPENDENT_AMBULATORY_CARE_PROVIDER_SITE_OTHER): Payer: Medicare Other | Admitting: Nurse Practitioner

## 2014-09-24 VITALS — BP 110/76 | HR 87 | Ht 70.5 in | Wt 358.8 lb

## 2014-09-24 DIAGNOSIS — I255 Ischemic cardiomyopathy: Secondary | ICD-10-CM | POA: Diagnosis not present

## 2014-09-24 DIAGNOSIS — I259 Chronic ischemic heart disease, unspecified: Secondary | ICD-10-CM

## 2014-09-24 DIAGNOSIS — I471 Supraventricular tachycardia: Secondary | ICD-10-CM

## 2014-09-24 DIAGNOSIS — I739 Peripheral vascular disease, unspecified: Secondary | ICD-10-CM

## 2014-09-24 DIAGNOSIS — I5022 Chronic systolic (congestive) heart failure: Secondary | ICD-10-CM

## 2014-09-24 LAB — BASIC METABOLIC PANEL
BUN: 17 mg/dL (ref 6–23)
CO2: 27 mEq/L (ref 19–32)
Calcium: 10.3 mg/dL (ref 8.4–10.5)
Chloride: 103 mEq/L (ref 96–112)
Creatinine, Ser: 1.65 mg/dL — ABNORMAL HIGH (ref 0.40–1.50)
GFR: 54.7 mL/min — ABNORMAL LOW (ref >60.00)
Glucose, Bld: 92 mg/dL (ref 70–99)
Potassium: 3.8 mEq/L (ref 3.5–5.1)
Sodium: 139 mEq/L (ref 135–145)

## 2014-09-24 LAB — LIPID PANEL
Cholesterol: 157 mg/dL (ref 0–200)
HDL: 46.6 mg/dL (ref >39.00)
LDL Cholesterol: 88 mg/dL (ref 0–99)
NonHDL: 110.08
Total CHOL/HDL Ratio: 3
Triglycerides: 110 mg/dL (ref 0.0–149.0)
VLDL: 22 mg/dL (ref 0.0–40.0)

## 2014-09-24 LAB — HEPATIC FUNCTION PANEL
ALT: 16 U/L (ref 0–53)
AST: 14 U/L (ref 0–37)
Albumin: 4.3 g/dL (ref 3.5–5.2)
Alkaline Phosphatase: 107 U/L (ref 39–117)
Bilirubin, Direct: 0.2 mg/dL (ref 0.0–0.3)
Total Bilirubin: 1.1 mg/dL (ref 0.2–1.2)
Total Protein: 8 g/dL (ref 6.0–8.3)

## 2014-09-24 LAB — CBC
HCT: 40.8 % (ref 39.0–52.0)
Hemoglobin: 13.2 g/dL (ref 13.0–17.0)
MCHC: 32.3 g/dL (ref 30.0–36.0)
MCV: 85.9 fl (ref 78.0–100.0)
Platelets: 151 10*3/uL (ref 150.0–400.0)
RBC: 4.74 Mil/uL (ref 4.22–5.81)
RDW: 15.9 % — ABNORMAL HIGH (ref 11.5–15.5)
WBC: 5.3 10*3/uL (ref 4.0–10.5)

## 2014-09-24 MED ORDER — SILDENAFIL CITRATE 100 MG PO TABS: 50.0000 mg | ORAL_TABLET | Freq: Every day | ORAL | Status: DC | PRN

## 2014-09-24 NOTE — Progress Notes (Signed)
CARDIOLOGY OFFICE NOTE  Date:  09/24/2014    Larry [name] Date of Birth: 1950-10-12 Medical Record #[id number]  PCP:  Kristine Garbe, MD  Cardiologist:  Allred    Chief Complaint  Patient presents with  . FU for CHF/CAD/HTN/atrial tach/obesity    Seen for Dr. Rayann Heman    History of Present Illness: Larry [name] is a 64 y.o. male who presents today for a follow up visit. This is a 3 month check. Seen for Dr. Rayann Heman. He has an ischemic CM with severe diffuse 3VD and prior stents to the RCA in 2010 per Dr. Angelena Form, systolic and diastolic dysfunction, HTN, HLD and obesity. Has had a history of atrial tach that has been treated with beta blocker. Was hospitalized back in April of 2013 with a HF exacerbation - had stopped his medicines. Last EF of 40 - to 07% with diastolic dysfunction as well noted in January of 2015.   Seen by me back in October of 2014 - Was out of his medicines again. Had lost weight and was anemic - was sending to GI and ended up having colon cancer.   He was admitted (02/02/2013 - 03/01/2013) to [facility] with subacute history of progressive weakness, constipation and difficulty eating and drinking. He presented with acute renal failure (Creatinine 7.83) and severe anemia (Hbg of 7.3) as well as obstruction. Colonoscopy doing this admission with biopsy revealed adenocarcinoma and patient underwent exploratory lap with partial colectomy with colostomy on 12/23 by Dr. Hulen Skains. Recovery was complicated by wound dehissance, requiring a return to the OR 12/29 with abdominal wound closure with VAC placment. After the procedure, the patient developed atrial tachycardia, so a dilt drip was started (12/29-1/2). On 1/4, the patient had a cardiac arrest, requiring defibrillation, and was subsequently transferred to the ICU. QT was found to be > 700, with mild hypokalemia and hypomagnesemia implicated as the cause. Lidocaine drip was started. Electrolyte  abnormality treated and 2D-echo revealed an EF of 40-45% with periapical akinesis and biatrial enlargement. He experienced recurrent torsades on 01/05 and shocked in NSR. By 1/6, QTc had decreased to 499, lidocaine drip was d/c'ed, and patient was transferred out of ICU to stepdown. Then admitted to physical medicine and rehabilitation on 01/09 due to severe deconditioning. He was continued on protein supplement to promote wound healing. His abdominal wound was treated with VAC through 03/05/13. His heart rate was controlled without recurrent arrythmia and was maintained on potassium and magnesium supplements. He was discharged to home on 03/07/2013. He was last evaluated by Dr. Hulen Skains on 02/03 with review of his pathology demonstrating invasive adenocarcinoma of the transverse colon with penetration into the pericolonic fat. He had 0/18 lymph nodes negative for metastatic disease. He was pathologic stage [id number].   Saw Dr. Gwenlyn Found in early July of 2015 for a foot ulcer - basically normal ABI's done.   He was readmitted to the hospital in August of 2015 - triggered by too much salt. Presented with respiratory distress - treated with Bipap and quickly stabilized. Was diuresed about 6 liters as well. Started on low dose ACE. Sleep study recommended.  I have seen back here several times since that hospitalization - he had improved. Titrated as much medicine as BP could tolerate. Felt to be stable from our standpoint. Referred to Sleep Medicine/Dr. Annamaria Boots and had CPAP ordered for his OSA.   Seen in March of 2016 - wanting to get his colostomy reversed and needing pre op  clearance. He was feeling pretty good - no real cardiac symptoms noted. Did have a superficial leg ulcer and is being treated by the wound clinic and has seen Dr. Bridgett Larsson for evaluation as well. Had a Myoview to risk stratify - this was high risk and I referred him on for cardiac cath with Dr. Burt Knack. Underwent PCI to the LAD with DES x 2. Now  committed to DAPT for one year ideally.  Last seen by me back in May - he was doing ok. Ok with knowing that he would have to wait on colostomy reversal.   Comes in today. Here alone. Doing ok. He has been trying to stay out of the heat. Breathing ok as long as he is indoors. No chest pain. No more foot/leg ulcers - finished at the Wound Clinic. He would like to have some Viagra - has used this in the past - no problems noted. Overall, he feels like he is doing ok.   Past Medical History  Diagnosis Date  . Hypertension   . Hypercholesteremia   . CHF (congestive heart failure)     has diastolic heart failure grade 1; EF is 45 to 50% per echo 05/2011; EF 41% by Myoview 2016  . Coronary artery disease   . COPD (chronic obstructive pulmonary disease)   . Obesity   . Noncompliance   . NSVT (nonsustained ventricular tachycardia)     beta blocker restarted  . Atrial tachycardia     managed on beta blocker therapy  . Peripheral arterial disease     small ulcer the head of right metatarsal plantar surface right foot  . Cardiac arrest     X 2 episodes during hospital visit 12'14"electrolyte imbalance"- "Shocked"  . S/P colostomy     2014  . OSA on CPAP     used nightly  . Childhood asthma     "went away after I was 16"  . CKD (chronic kidney disease) stage 3, GFR 30-59 ml/min   . Thrombocytopenia   . Family history of ovarian cancer   . Colon cancer     MSI high; IHC loss of MLH1 and PMS2; BRAF negative; Negative methylation    Past Surgical History  Procedure Laterality Date  . Colonoscopy N/A 02/08/2013    Procedure: COLONOSCOPY;  Surgeon: [name], MD;  Location: [place];  Service: Endoscopy;  Laterality: N/A;  . Esophagogastroduodenoscopy N/A 02/08/2013    Procedure: ESOPHAGOGASTRODUODENOSCOPY (EGD);  Surgeon: [name], MD;  Location: [facility] ENDOSCOPY;  Service: Endoscopy;  Laterality: N/A;  . Laparotomy N/A 02/12/2013    Procedure: EXPLORATORY LAPAROTOMY PARTIAL  COLECTOMY WITH COLOSTOMY;  Surgeon: [name], MD;  Location: [place];  Service: General;  Laterality: N/A;  . Laparotomy N/A 02/18/2013    Procedure: EXPLORATORY LAPAROTOMY/Closure of Wound;  Surgeon: [name], MD;  Location: [place];  Service: General;  Laterality: N/A;  . Coronary angioplasty with stent placement  11/12/2008; 06/11/2014    stent x 2 to RCA; stent x 2 to LAD  . Colon surgery    . Left heart catheterization with coronary angiogram N/A 06/11/2014    Procedure: LEFT HEART CATHETERIZATION WITH CORONARY ANGIOGRAM;  Surgeon: [name], MD; CFX calcified, 30-40 percent, RCA calcified, 40/50/40%, PDA diffuse disease, LAD 40/75/90% s/p DES 2   . Coronary stent placement  06/11/2014    m-LAD 3.5 x 16 mm Synergy DES, d-LAD  2.25 x 16 mm Synergy DES  . Colonoscopy with propofol N/A 05/09/2014  Procedure: COLONOSCOPY WITH PROPOFOL;  Surgeon: [name], MD;  Location: [facility] ENDOSCOPY;  Service: Endoscopy;  Laterality: N/A;     Medications: Current Outpatient Prescriptions  Medication Sig Dispense Refill  . aspirin EC 81 MG tablet Take 1 tablet (81 mg total) by mouth daily. 30 tablet 0  . atorvastatin (LIPITOR) 40 MG tablet Take 1 tablet (40 mg total) by mouth daily. (Patient taking differently: Take 40 mg by mouth daily at 6 PM. ) 90 tablet 3  . carvedilol (COREG) 12.5 MG tablet Take 1 tablet (12.5 mg total) by mouth 2 (two) times daily. 180 tablet 3  . clopidogrel (PLAVIX) 75 MG tablet Take 1 tablet (75 mg total) by mouth daily with breakfast. 30 tablet 11  . ferrous sulfate 325 (65 FE) MG tablet Take 1 tablet (325 mg total) by mouth daily with breakfast. 30 tablet 1  . furosemide (LASIX) 40 MG tablet Take 1 tablet (40 mg total) by mouth daily. 30 tablet 11  . KLOR-CON M20 20 MEQ tablet TAKE 1 TABLET TWICE DAILY 60 tablet 3  . levalbuterol (XOPENEX HFA) 45 MCG/ACT inhaler Inhale 2 puffs into the lungs every 4 (four) hours as needed for wheezing. 1 Inhaler 11  . Magnesium  500 MG CAPS Take 500 mg by mouth daily.    . Multiple Vitamins-Minerals (MULTIVITAMIN WITH MINERALS) tablet Take 1 tablet by mouth daily.    . nitroGLYCERIN (NITROSTAT) 0.4 MG SL tablet Place 0.4 mg under the tongue every 5 (five) minutes as needed for chest pain.    . pantoprazole (PROTONIX) 40 MG tablet TAKE 1 TABLET (40 MG TOTAL) BY MOUTH AT BEDTIME. 30 tablet 10  . ramipril (ALTACE) 1.25 MG capsule TAKE ONE CAPSULE BY MOUTH EVERY 12 HOURS 60 capsule 3  . simethicone (GAS-X) 80 MG chewable tablet Chew 1 tablet (80 mg total) by mouth 4 (four) times daily. 120 tablet 0  . spironolactone (ALDACTONE) 25 MG tablet TAKE 1 TABLET (25 MG TOTAL) BY MOUTH DAILY. 30 tablet 10  . sildenafil (VIAGRA) 100 MG tablet Take 0.5 tablets (50 mg total) by mouth daily as needed for erectile dysfunction. 10 tablet 3   No current facility-administered medications for this visit.    Allergies: No Known Allergies  Social History: The patient  reports that he quit smoking about 6 years ago. His smoking use included Cigarettes. He has a 38 pack-year smoking history. He has never used smokeless tobacco. He reports that he drinks alcohol. He reports that he does not use illicit drugs.   Family History: The patient's family history includes Dementia in his mother; Diabetes in his father; Heart disease in his father; Hypertension in his father and mother; Leukemia in his maternal uncle; Liver cancer in his maternal grandmother; Parkinsonism in his mother; Prostate cancer in his maternal uncle.   Review of Systems: Please see the history of present illness.   Otherwise, the review of systems is positive for none.   All other systems are reviewed and negative.   Physical Exam: VS:  BP 110/76 mmHg  Pulse 87  Ht 5' 10.5" (1.791 m)  Wt 358 lb 12.8 oz (162.751 kg)  BMI 50.74 kg/m2  SpO2 95% .  BMI Body mass index is 50.74 kg/(m^2).  Wt Readings from Last 3 Encounters:  09/24/14 358 lb 12.8 oz (162.751 kg)  09/18/14  355 lb (161.027 kg)  07/11/14 364 lb (165.109 kg)    General: Pleasant. Well developed, well nourished and in no acute distress. Remains obese. Weight  up a few pounds.  HEENT: Normal. Neck: Supple, no JVD, carotid bruits, or masses noted.  Cardiac: Heart tones are distant. Legs are full with trace edema and brawny stasis changes.  Respiratory:  Lungs are fairly clear to auscultation bilaterally with normal work of breathing.  GI: Soft and nontender.  MS: No deformity or atrophy. Gait and ROM intact. Skin: Warm and dry. Color is normal.  Neuro:  Strength and sensation are intact and no gross focal deficits noted.  Psych: Alert, appropriate and with normal affect.   LABORATORY DATA:  EKG:  EKG is not ordered today.   Lab Results  Component Value Date   WBC 5.2 06/26/2014   HGB 12.4* 06/26/2014   HCT 36.4* 06/26/2014   PLT 153.0 06/26/2014   GLUCOSE 94 06/26/2014   CHOL 158 10/02/2013   TRIG 117 10/02/2013   HDL 67 10/02/2013   LDLCALC 68 10/02/2013   ALT 19 05/12/2014   AST 17 05/12/2014   NA 137 06/26/2014   K 4.2 06/26/2014   CL 104 06/26/2014   CREATININE 1.67* 06/26/2014   BUN 22 06/26/2014   CO2 27 06/26/2014   TSH 0.70 12/06/2012   INR 1.0 06/09/2014   HGBA1C 6.0 11/26/2009    BNP (last 3 results) No results for input(s): BNP in the last 8760 hours.  ProBNP (last 3 results)  Recent Labs  09/30/13 0736 10/15/13 1117 01/31/14 1012  PROBNP 397.2* 9.0 7.0     Other Studies Reviewed Today:  Procedure: Left Heart Cath, Selective Coronary Angiography, PTCA and stenting of the mid and distal LAD.  Indication: Severe exertional dyspnea, high risk Myoview scan in patient with known CAD. Myoview scan demonstrated large inferolateral scar as well as antero-lateral and anteroapical ischemia suggestive of multivessel coronary artery disease.  Procedural Details: The right wrist was prepped, draped, and anesthetized with 1% lidocaine. Using the modified Seldinger  technique, a 5/6 French Slender sheath was introduced into the right radial artery. 3 mg of verapamil was administered through the sheath, weight-based unfractionated heparin was administered intravenously. Standard Judkins catheters were used for selective coronary angiography. Catheter exchanges were performed over an exchange length guidewire.  PROCEDURAL FINDINGS Hemodynamics: AO 102/62 LV 102/6  Coronary angiography: Coronary dominance: right  Left mainstem: The left mainstem is patent. The vessel divides into the LAD and left circumflex. There is no significant stenosis, but there is mild irregularity.  Left anterior descending (LAD): The LAD is moderately calcified. The vessel is severely diseased. The proximal LAD is patent with 30-40% stenosis after the second septal perforator. There is diffuse calcification. The diagonal branches are small. The mid LAD has an eccentric 75% stenosis at the origin of the second diagonal branch. Beyond that area the mid and distal LAD are diffusely diseased. There is another severe stenosis in the apical LAD of 90%.  Left circumflex (LCx): The left circumflex is calcified. The vessel is patent with mild irregularity. There are no high-grade stenoses identified. There are scattered 30-40% stenoses throughout the proximal and mid circumflex as well as the first OM branch.  Right coronary artery (RCA): The RCA is dominant. The vessel is heavily calcified. The stented segments in the mid and distal RCA are patent. The proximal vessel has 30-40% stenosis. The mid vessel has 30-40% stenosis. The distal vessel is patent with 50-60% stenosis involving the origin of the PDA. The PDA is diffusely diseased.  Left ventriculography: Deferred because of chronic kidney disease. By nuclear scan the LVEF is 41%.  PCI Note: Following the diagnostic procedure, the decision was made to proceed with PCI of the mid and apical LAD. The right coronary artery had  patent stents in the left circumflex had nonobstructive disease. The LAD is a diffusely diseased vessel and the mid and distal vessel would be a poor surgical targets. I felt that PCI was the best option in this patient with a high risk nuclear scan demonstrating anteroapical and anterolateral ischemia. He will require colostomy reversal, so I planned on treating him with Synergy drug-eluting stents which had a biodegradable polymer and potentially can allow for earlier interruption of dual antiplatelet therapy. The patient was loaded with Plavix 600 mg. Weight-based bivalirudin was given for anticoagulation. Once a therapeutic ACT was achieved, a 6 [place] XB LAD 3.5 cm guide catheter was inserted. A cougar coronary guidewire was used to cross the lesion in the apical LAD. The lesion was predilated with a 2.5 x 12 mm balloon. The same balloon was used to dilate the mid lesion. The apical lesion was then stented with a 2.25 x 16 mm Synergy DES. The stent was postdilated with a 2.5 mm noncompliant balloon. The mid lesion was then stented with a 3.5 x 16 mm Synergy DES. That stent was postdilated with a 3.75 mm noncompliant balloon. Following PCI, there was 0% residual stenosis and TIMI-3 flow. Final angiography confirmed an excellent result. The patient tolerated the procedure well. There were no immediate procedural complications. A TR band was used for radial hemostasis. The patient was transferred to the post catheterization recovery area for further monitoring.  PCI Data: Lesion 1: Vessel - LAD/Segment - distal/apical Percent Stenosis (pre) 90 TIMI-flow 3 Stent 2.25 x 16 mm Synergy DES Percent Stenosis (post) 0 TIMI-flow (post) 3  Lesion 2: Vessel - LAD/Segment - mid Percent Stenosis (pre) 75 TIMI-flow 3 Stent 3.5 x 16 mm Synergy DES Percent Stenosis (post) 0 TIMI-flow (post) 3  Estimated Blood Loss: Minimal  Final Conclusions:  1. Two-vessel coronary artery disease with continued  patency of the stented segments in the right coronary artery and severe stenoses of the mid and apical LAD 2. Mild diffuse nonobstructive left circumflex stenosis 3. Successful PCI of the LAD using 2 drug-eluting stents   Recommendations:  Dual antiplatelet therapy for at least 6 months. Would be reasonable to consider interruption of aspirin and Plavix at 6 months for colostomy reversal.  [name] MD, [facility] 06/11/2014, 10:06 AM   Myoview Impression April 2016 Exercise Capacity: Lexiscan with no exercise. BP Response: Normal blood pressure response. Clinical Symptoms: There is dyspnea. ECG Impression: No significant ST segment change suggestive of ischemia. Comparison with Prior Nuclear Study: No images to compare  Overall Impression: High risk stress nuclear study large inferior wall infarct from apex to base. Also suggestion of apical, anterior and lateral wall ischemia.  LV Ejection Fraction: 41%. LV Wall Motion: Inferiior akinesis Consistent with possible 3 VD Consider cath before major surgery   [place]   Echo Study Conclusions from January 2015  - Left ventricle: The cavity size was mildly dilated. Wall thickness was increased in a pattern of mild LVH. Systolic function was mildly to moderately reduced. The estimated ejection fraction was in the range of 40% to 45%. There was akinesis of the apical lateral, apical anterior, and apical septal segments. There was akinesis of the true apex. Prominent trabeculation at the apex but I do not think thrombus was present. Patient had Definity study recently to look at the apex and no  thrombus was seen. Doppler parameters are consistent with abnormal left ventricular relaxation (grade 1 diastolic dysfunction). - Aortic valve: Trileaflet; moderately calcified leaflets. There was no stenosis. - Mitral valve: Moderately calcified annulus. Mildly calcified leaflets . Trivial regurgitation. - Left atrium: The  atrium was moderately dilated. - Right ventricle: The cavity size was normal. Systolic function was normal. - Right atrium: The atrium was mildly dilated. - Pulmonary arteries: No complete TR doppler jet so unable to estimate PA systolic pressure. - Systemic veins: IVC was not visualized. - Pericardium, extracardiac: A trivial pericardial effusion was identified. Impressions:  - Mildly dilated LV with mild LV hypertrophy. EF 40-45% with peri-apical akinesis. I do not think that LV apical thrombus is present. Normal RV size and systolic function. Biatrial enlargement.   Assessment / Plan:  1. CAD with prior PCI to the RCA - S/P cath with PCI to the LAD back in April following high risk Myoview - has had DES x 2 to the LAD - on aspirin/Plavix. Doing well. I have left him on his current regimen.   2. Combined systolic and diastolic HF - seems to be stable.   3. Colon cancer - followed by oncology - was hopeful to have his colostomy reversed. He tells me he is ok to wait the full 12 months and I agree with waiting a full 12 months - even then I think this will be challenging. May consider proceeding in 6 months. For now, his plans are on hold.   4. HTN - stable - BP limiting how much HF meds he can have.   5. CKD - on very low dose ACE   6. OSA - now on CPAP and followed by Dr. Annamaria Boots  7. HLD - on statin - rechecking labs today.  8. Atrial tachycardia - remains on beta blocker therapy - no real complaints of palpitations.   Current medicines are reviewed with the patient today.  The patient does not have concerns regarding medicines other than what has been noted above.  The following changes have been made:  See above.  Labs/ tests ordered today include:    Orders Placed This Encounter  Procedures  . Basic metabolic panel  . CBC  . Hepatic function panel  . Lipid panel     Disposition:   FU with me in 4 months. Will need to think about a general cardiologist to follow  as well.    Patient is agreeable to this plan and will call if any problems develop in the interim.   Signed: Burtis Junes, RN, ANP-C 09/24/2014 9:54 AM  [facility] [address] [place], [place]  [address] Phone: [phone] Fax: [phone]

## 2014-09-24 NOTE — Patient Instructions (Addendum)
We will be checking the following labs today - BMET, Lipids, LFTs and CBC   Medication Instructions:    Continue with your current medicines.   I sent in a RX for Viagra 100 mg tablets - to take 1/2 tablet as directed    Testing/Procedures To Be Arranged:  N/A  Follow-Up:   See me in 4 months    Other Special Instructions:   N/A  Call the [place] office at [phone] if you have any questions, problems or concerns.

## 2014-09-29 ENCOUNTER — Other Ambulatory Visit (HOSPITAL_BASED_OUTPATIENT_CLINIC_OR_DEPARTMENT_OTHER): Payer: Medicare Other

## 2014-09-29 ENCOUNTER — Ambulatory Visit (HOSPITAL_BASED_OUTPATIENT_CLINIC_OR_DEPARTMENT_OTHER): Payer: Medicare Other | Admitting: Oncology

## 2014-09-29 ENCOUNTER — Telehealth: Payer: Self-pay | Admitting: Oncology

## 2014-09-29 VITALS — BP 125/79 | HR 84 | Temp 98.0°F | Resp 21 | Ht 70.5 in | Wt 358.2 lb

## 2014-09-29 DIAGNOSIS — Z85038 Personal history of other malignant neoplasm of large intestine: Secondary | ICD-10-CM

## 2014-09-29 DIAGNOSIS — C189 Malignant neoplasm of colon, unspecified: Secondary | ICD-10-CM

## 2014-09-29 NOTE — Telephone Encounter (Signed)
Pt confirmed labs/ov per 08/08 POF, gave pt avs and calendar.... KJ °

## 2014-09-29 NOTE — Progress Notes (Signed)
  [place] OFFICE PROGRESS NOTE   Diagnosis: Colon cancer  INTERVAL HISTORY:   Mr. Wishart returns as scheduled. He feels well. No difficulty with bowel function. Good appetite. He underwent a percutaneous intervention for coronary artery disease in March. He reports the colostomy reversal has been placed on hold secondary to this procedure. Leg ulcers healed after treatment at the wound clinic.  Objective:  Vital signs in last 24 hours:  There were no vitals taken for this visit.    HEENT: Neck without mass Lymphatics: No cervical, supra-clavicular, axillary, or inguinal nodes (cutaneous inclusion cyst at the right axilla) Resp: Lungs clear bilaterally Cardio: Regular rate and rhythm GI: No hepatosplenomegaly, right abdomen colostomy, nontender, no mass Vascular: Trace edema with chronic stasis change at the lower leg bilaterally  Skin: Scabbing remains at the midline abdominal wound     Lab Results:  Lab Results  Component Value Date   WBC 5.3 09/24/2014   HGB 13.2 09/24/2014   HCT 40.8 09/24/2014   MCV 85.9 09/24/2014   PLT 151.0 09/24/2014   NEUTROABS 4.6 05/12/2014      Lab Results  Component Value Date   CEA 1.7 03/31/2014     Medications: I have reviewed the patient's current medications.  Assessment/Plan: 1. Adenocarcinoma of the transverse colon, stage IIc (T4 N0), status post a partial colectomy/colostomy 02/12/2013  Microsatellite instability-high, loss of MLH1 and PMS2 expression  No BRAF mutation  Negative for MLH1 methylation  Gene panel confirmed a variant of unknown significance in the PMS2  Colonoscopy on 05/09/2014-negative  2. COPD  3. Coronary artery disease  4. History of peripheral arterial disease    Disposition:  Mr. Narez remains in clinical remission from colon cancer. We will follow-up on the CEA from today. We will refer him to the genetics counselor to discuss the PMS2 variant. Mr. Boxx  will return for an office visit and CEA in 6 months.  Betsy Coder, MD  09/29/2014  11:23 AM

## 2014-09-30 LAB — CEA: CEA: 1 ng/mL (ref 0.0–5.0)

## 2014-10-01 ENCOUNTER — Telehealth: Payer: Self-pay | Admitting: *Deleted

## 2014-10-01 NOTE — Telephone Encounter (Signed)
-----   Message from Ladell Pier, MD sent at 09/30/2014  1:38 PM EDT ----- Please call patient, cea is normal

## 2014-10-02 ENCOUNTER — Telehealth: Payer: Self-pay | Admitting: Nurse Practitioner

## 2014-10-02 NOTE — Telephone Encounter (Signed)
Late entry for 10/01/14: Informed pt of normal CEA results. He voiced appreciation for call.

## 2014-10-02 NOTE — Telephone Encounter (Signed)
Called Pt. In reference to his cea results relayed to him that lab test results are normal. Pt. Verbalized understanding

## 2014-10-10 ENCOUNTER — Telehealth: Payer: Self-pay | Admitting: Nurse Practitioner

## 2014-10-10 ENCOUNTER — Other Ambulatory Visit: Payer: Self-pay | Admitting: *Deleted

## 2014-10-10 MED ORDER — PANTOPRAZOLE SODIUM 40 MG PO TBEC: 40.0000 mg | DELAYED_RELEASE_TABLET | Freq: Every day | ORAL | Status: DC

## 2014-10-10 NOTE — Telephone Encounter (Signed)
S/w pt stated cannot get Protonix anymore per drug company stated exceeded medication limit.  Will send in generic pantoprazole.  If pt cannot not get generic medication will call back on monday

## 2014-10-10 NOTE — Telephone Encounter (Signed)
New problem   Pt want a generic brand for his Pantoprazole 40mg . Please call pt.

## 2014-10-14 ENCOUNTER — Other Ambulatory Visit: Payer: Self-pay | Admitting: *Deleted

## 2014-10-14 MED ORDER — SPIRONOLACTONE 25 MG PO TABS: ORAL_TABLET | ORAL | Status: DC

## 2014-10-14 MED ORDER — FUROSEMIDE 40 MG PO TABS: 40.0000 mg | ORAL_TABLET | Freq: Every day | ORAL | Status: DC

## 2014-10-15 ENCOUNTER — Telehealth: Payer: Self-pay

## 2014-10-15 NOTE — Telephone Encounter (Signed)
Prior auth obtained for Pantoprazole 40mg  tabs 1 po daily. Good through 10/15/2015. Patient notified.

## 2014-10-30 NOTE — Assessment & Plan Note (Signed)
Good compliance and control and good symptom response to CPAP Plan-emphasize weight loss, continue CPAP auto 10-20/Advanced

## 2014-10-30 NOTE — Assessment & Plan Note (Signed)
Explained impact of obesity on sleep apnea control

## 2014-11-19 ENCOUNTER — Encounter: Payer: Self-pay | Admitting: Internal Medicine

## 2014-11-27 ENCOUNTER — Other Ambulatory Visit: Payer: Self-pay | Admitting: Internal Medicine

## 2014-11-27 ENCOUNTER — Other Ambulatory Visit: Payer: Self-pay | Admitting: Nurse Practitioner

## 2014-12-29 ENCOUNTER — Other Ambulatory Visit: Payer: Self-pay | Admitting: Internal Medicine

## 2015-01-16 ENCOUNTER — Ambulatory Visit: Payer: Medicare Other | Admitting: Vascular Surgery

## 2015-01-16 ENCOUNTER — Encounter (HOSPITAL_COMMUNITY): Payer: Medicare Other

## 2015-01-20 ENCOUNTER — Encounter: Payer: Self-pay | Admitting: Vascular Surgery

## 2015-01-23 ENCOUNTER — Ambulatory Visit (HOSPITAL_COMMUNITY)
Admission: RE | Admit: 2015-01-23 | Discharge: 2015-01-23 | Disposition: A | Discharge status: Home / Self Care | Payer: Medicare Other | Source: Ambulatory Visit | Attending: Family | Admitting: Family

## 2015-01-23 ENCOUNTER — Ambulatory Visit (INDEPENDENT_AMBULATORY_CARE_PROVIDER_SITE_OTHER): Payer: Medicare Other | Admitting: Family

## 2015-01-23 ENCOUNTER — Encounter: Payer: Self-pay | Admitting: Family

## 2015-01-23 VITALS — BP 118/74 | HR 85 | Temp 97.3°F | Resp 16 | Ht 71.0 in | Wt 312.0 lb

## 2015-01-23 DIAGNOSIS — N183 Chronic kidney disease, stage 3 (moderate): Secondary | ICD-10-CM | POA: Diagnosis not present

## 2015-01-23 DIAGNOSIS — I872 Venous insufficiency (chronic) (peripheral): Secondary | ICD-10-CM

## 2015-01-23 DIAGNOSIS — E7800 Pure hypercholesterolemia, unspecified: Secondary | ICD-10-CM | POA: Diagnosis not present

## 2015-01-23 DIAGNOSIS — R938 Abnormal findings on diagnostic imaging of other specified body structures: Secondary | ICD-10-CM | POA: Insufficient documentation

## 2015-01-23 DIAGNOSIS — L97329 Non-pressure chronic ulcer of left ankle with unspecified severity: Secondary | ICD-10-CM

## 2015-01-23 DIAGNOSIS — I259 Chronic ischemic heart disease, unspecified: Secondary | ICD-10-CM

## 2015-01-23 DIAGNOSIS — I739 Peripheral vascular disease, unspecified: Secondary | ICD-10-CM | POA: Diagnosis not present

## 2015-01-23 DIAGNOSIS — I83023 Varicose veins of left lower extremity with ulcer of ankle: Secondary | ICD-10-CM | POA: Diagnosis not present

## 2015-01-23 DIAGNOSIS — Z48812 Encounter for surgical aftercare following surgery on the circulatory system: Secondary | ICD-10-CM | POA: Diagnosis not present

## 2015-01-23 DIAGNOSIS — I129 Hypertensive chronic kidney disease with stage 1 through stage 4 chronic kidney disease, or unspecified chronic kidney disease: Secondary | ICD-10-CM | POA: Diagnosis not present

## 2015-01-23 NOTE — Patient Instructions (Addendum)
Peripheral Vascular Disease  Peripheral vascular disease (PVD) is a disease of the blood vessels that are not part of your heart and brain. A simple term for PVD is poor circulation. In most cases, PVD narrows the blood vessels that carry blood from your heart to the rest of your body. This can result in a decreased supply of blood to your arms, legs, and internal organs, like your stomach or kidneys. However, it most often affects a person's lower legs and feet.  There are two types of PVD.  · Organic PVD. This is the more common type. It is caused by damage to the structure of blood vessels.  · Functional PVD. This is caused by conditions that make blood vessels contract and tighten (spasm).  Without treatment, PVD tends to get worse over time.  PVD can also lead to acute ischemic limb. This is when an arm or limb suddenly has trouble getting enough blood. This is a medical emergency.  CAUSES  Each type of PVD has many different causes. The most common cause of PVD is buildup of a fatty material (plaque) inside of your arteries (atherosclerosis). Small amounts of plaque can break off from the walls of the blood vessels and become lodged in a smaller artery. This blocks blood flow and can cause acute ischemic limb.  Other common causes of PVD include:  · Blood clots that form inside of blood vessels.  · Injuries to blood vessels.  · Diseases that cause inflammation of blood vessels or cause blood vessel spasms.  · Health behaviors and health history that increase your risk of developing PVD.  RISK FACTORS   You may have a greater risk of PVD if you:  · Have a family history of PVD.  · Have certain medical conditions, including:    High cholesterol.    Diabetes.    High blood pressure (hypertension).    Coronary heart disease.    Past problems with blood clots.    Past injury, such as burns or a broken bone. These may have damaged blood vessels in your limbs.    Buerger disease. This is caused by inflamed blood  vessels in your hands and feet.    Some forms of arthritis.    Rare birth defects that affect the arteries in your legs.  · Use tobacco.  · Do not get enough exercise.  · Are obese.  · Are age 50 or older.  SIGNS AND SYMPTOMS   PVD may cause many different symptoms. Your symptoms depend on what part of your body is not getting enough blood. Some common signs and symptoms include:  · Cramps in your lower legs. This may be a symptom of poor leg circulation (claudication).  · Pain and weakness in your legs while you are physically active that goes away when you rest (intermittent claudication).  · Leg pain when at rest.  · Leg numbness, tingling, or weakness.  · Coldness in a leg or foot, especially when compared with the other leg.  · Skin or hair changes. These can include:    Hair loss.    Shiny skin.    Pale or bluish skin.    Thick toenails.  · Inability to get or maintain an erection (erectile dysfunction).  People with PVD are more prone to developing ulcers and sores on their toes, feet, or legs. These may take longer than normal to heal.  DIAGNOSIS  Your health care provider may diagnose PVD from your signs and symptoms.   The health care provider will also do a physical exam. You may have tests to find out what is causing your PVD and determine its severity. Tests may include:  · Blood pressure recordings from your arms and legs and measurements of the strength of your pulses (pulse volume recordings).  · Imaging studies using sound waves to take pictures of the blood flow through your blood vessels (Doppler ultrasound).  · Injecting a dye into your blood vessels before having imaging studies using:    X-rays (angiogram or arteriogram).    Computer-generated X-rays (CT angiogram).    A powerful electromagnetic field and a computer (magnetic resonance angiogram or MRA).  TREATMENT  Treatment for PVD depends on the cause of your condition and the severity of your symptoms. It also depends on your age. Underlying  causes need to be treated and controlled. These include long-lasting (chronic) conditions, such as diabetes, high cholesterol, and high blood pressure. You may need to first try making lifestyle changes and taking medicines. Surgery may be needed if these do not work.  Lifestyle changes may include:  · Quitting smoking.  · Exercising regularly.  · Following a low-fat, low-cholesterol diet.  Medicines may include:  · Blood thinners to prevent blood clots.  · Medicines to improve blood flow.  · Medicines to improve your blood cholesterol levels.  Surgical procedures may include:  · A procedure that uses an inflated balloon to open a blocked artery and improve blood flow (angioplasty).  · A procedure to put in a tube (stent) to keep a blocked artery open (stent implant).  · Surgery to reroute blood flow around a blocked artery (peripheral bypass surgery).  · Surgery to remove dead tissue from an infected wound on the affected limb.  · Amputation. This is surgical removal of the affected limb. This may be necessary in cases of acute ischemic limb that are not improved through medical or surgical treatments.  HOME CARE INSTRUCTIONS  · Take medicines only as directed by your health care provider.  · Do not use any tobacco products, including cigarettes, chewing tobacco, or electronic cigarettes.  If you need help quitting, ask your health care provider.  · Lose weight if you are overweight, and maintain a healthy weight as directed by your health care provider.  · Eat a diet that is low in fat and cholesterol. If you need help, ask your health care provider.  · Exercise regularly. Ask your health care provider to suggest some good activities for you.  · Use compression stockings or other mechanical devices as directed by your health care provider.  · Take good care of your feet.    Wear comfortable shoes that fit well.    Check your feet often for any cuts or sores.  SEEK MEDICAL CARE IF:  · You have cramps in your legs  while walking.  · You have leg pain when you are at rest.  · You have coldness in a leg or foot.  · Your skin changes.  · You have erectile dysfunction.  · You have cuts or sores on your feet that are not healing.  SEEK IMMEDIATE MEDICAL CARE IF:  · Your arm or leg turns cold and blue.  · Your arms or legs become red, warm, swollen, painful, or numb.  · You have chest pain or trouble breathing.  · You suddenly have weakness in your face, arm, or leg.  · You become very confused or lose the ability to speak.  ·   You suddenly have a very bad headache or lose your vision.     This information is not intended to replace advice given to you by your health care provider. Make sure you discuss any questions you have with your health care provider.     Document Released: 03/17/2004 Document Revised: 02/28/2014 Document Reviewed: 07/18/2013  Elsevier Interactive Patient Education ©2016 [employer]    Venous Stasis or Chronic Venous Insufficiency  Chronic venous insufficiency, also called venous stasis, is a condition that affects the veins in the legs. The condition prevents blood from being pumped through these veins effectively. Blood may no longer be pumped effectively from the legs back to the heart. This condition can range from mild to severe. With proper treatment, you should be able to continue with an active life.  CAUSES   Chronic venous insufficiency occurs when the vein walls become stretched, weakened, or damaged or when valves within the vein are damaged. Some common causes of this include:  · High blood pressure inside the veins (venous hypertension).  · Increased blood pressure in the leg veins from long periods of sitting or standing.  · A blood clot that blocks blood flow in a vein (deep vein thrombosis).  · Inflammation of a superficial vein (phlebitis) that causes a blood clot to form.  RISK FACTORS  Various things can make you more likely to develop chronic venous insufficiency, including:  · Family  history of this condition.  · Obesity.  · Pregnancy.  · Sedentary lifestyle.  · Smoking.  · Jobs requiring long periods of standing or sitting in one place.  · Being a certain age. Women in their 40s and 50s and men in their 70s are more likely to develop this condition.  SIGNS AND SYMPTOMS   Symptoms may include:   · Varicose veins.  · Skin breakdown or ulcers.  · Reddened or discolored skin on the leg.  · Brown, smooth, tight, and painful skin just above the ankle, usually on the inside surface (lipodermatosclerosis).  · Swelling.  DIAGNOSIS   To diagnose this condition, your health care provider will take a medical history and do a physical exam. The following tests may be ordered to confirm the diagnosis:  · Duplex ultrasound--A procedure that produces a picture of a blood vessel and nearby organs and also provides information on blood flow through the blood vessel.  · Plethysmography--A procedure that tests blood flow.  · A venogram, or venography--A procedure used to look at the veins using X-ray and dye.  TREATMENT  The goals of treatment are to help you return to an active life and to minimize pain or disability. Treatment will depend on the severity of the condition. Medical procedures may be needed for severe cases. Treatment options may include:   · Use of compression stockings. These can help with symptoms and lower the chances of the problem getting worse, but they do not cure the problem.  · Sclerotherapy--A procedure involving an injection of a material that "dissolves" the damaged veins. Other veins in the network of blood vessels take over the function of the damaged veins.  · Surgery to remove the vein or cut off blood flow through the vein (vein stripping or laser ablation surgery).  · Surgery to repair a valve.  HOME CARE INSTRUCTIONS   · Wear compression stockings as directed by your health care provider.  · Only take over-the-counter or prescription medicines for pain, discomfort, or fever as  directed by your health   care provider.  · Follow up with your health care provider as directed.  SEEK MEDICAL CARE IF:   · You have redness, swelling, or increasing pain in the affected area.  · You see a red streak or line that extends up or down from the affected area.  · You have a breakdown or loss of skin in the affected area, even if the breakdown is small.  · You have an injury to the affected area.  SEEK IMMEDIATE MEDICAL CARE IF:   · You have an injury and open wound in the affected area.  · Your pain is severe and does not improve with medicine.  · You have sudden numbness or weakness in the foot or ankle below the affected area, or you have trouble moving your foot or ankle.  · You have a fever or persistent symptoms for more than 2-3 days.  · You have a fever and your symptoms suddenly get worse.  MAKE SURE YOU:   · Understand these instructions.  · Will watch your condition.  · Will get help right away if you are not doing well or get worse.     This information is not intended to replace advice given to you by your health care provider. Make sure you discuss any questions you have with your health care provider.     Document Released: 06/13/2006 Document Revised: 11/28/2012 Document Reviewed: 10/15/2012  Elsevier Interactive Patient Education ©2016 Elsevier Inc.

## 2015-01-23 NOTE — Progress Notes (Signed)
[facility] [place] HISTORY AND PHYSICAL -PAD  History of Present Illness Larry [name] is a 64 y.o. male patient of Dr. Bridgett Larsson who presents with chief complaint: follow up PAD and and history of chronic venous stasis ulcer on both lower legs which have healed. The wounds were located on the lateral aspect of both lower legs, above the lateral malleoli.  He denies any other non-healing wounds. He denies any pain in his legs or feet.   He was being treated at the wound care center at [facility]. The patient reports onset of leg swelling following his heart stents in 2010. The patient is not diabetic. He is able to ambulate without assistance but does not ambulate much by choice. He lives at home with his wife. He denies any history of claudication (he does not ambulate enough to elicit claudication however) or rest pain.   He has a complex past medical history including ischemic cardiomyopathy with severe three vessel disease s/p stents to the RCA in 2010 by Dr. Angelena Form, combined systolic and diastolic HF, colon cancer s/p partial colectomy with colostomy, atrial tachycardia, history of cardiac arrest, obstructive sleep apnea, now on CPAP, CKD, hypertension and hyperlipidemia.  He is a former smoker.   His bilateral lower legs ulcers healed with 6 weeks of weekly Hydrogel dressing changes at the wound care center near [facility]. He wears compression hose.   He denies any new medical or surgical issues.   Pt Diabetic: No Pt smoker: former smoker, quit in 2010, started at age 73 years  Pt meds include: Statin :Yes Betablocker: Yes ASA: Yes Other anticoagulants/antiplatelets: no   Past Medical History  Diagnosis Date  . Hypertension   . Hypercholesteremia   . CHF (congestive heart failure) (HCC)     has diastolic heart failure grade 1; EF is 45 to 50% per echo 05/2011; EF 41% by Myoview 2016  . Coronary artery disease   . COPD (chronic obstructive  pulmonary disease) ([place])   . Obesity   . Noncompliance   . NSVT (nonsustained ventricular tachycardia) (HCC)     beta blocker restarted  . Atrial tachycardia ([place])     managed on beta blocker therapy  . Peripheral arterial disease (HCC)     small ulcer the head of right metatarsal plantar surface right foot  . Cardiac arrest ([place])     X 2 episodes during hospital visit 12'14"electrolyte imbalance"- "Shocked"  . S/P colostomy ([place])     2014  . OSA on CPAP     used nightly  . Childhood asthma     "went away after I was 16"  . CKD (chronic kidney disease) stage 3, GFR 30-59 ml/min   . Thrombocytopenia ([place])   . Family history of ovarian cancer   . Colon cancer ([place])     MSI high; IHC loss of MLH1 and PMS2; BRAF negative; Negative methylation    Social History Social History  Substance Use Topics  . Smoking status: Former Smoker -- 1.00 packs/day for 38 years    Types: Cigarettes    Quit date: 07/15/2008  . Smokeless tobacco: Never Used  . Alcohol Use: 0.0 oz/week    0 Standard drinks or equivalent per week     Comment: 06/11/2014 "I rarely have a drink; last drink was New Years; if I wanted a drink I would"    Family History Family History  Problem Relation Age of Onset  . Hypertension Father   .  Heart disease Father     before age 26  . Diabetes Father   . Hypertension Mother   . Dementia Mother   . Parkinsonism Mother   . Prostate cancer Maternal Uncle   . Liver cancer Maternal Grandmother     dx in her 87s  . Leukemia Maternal Uncle     Past Surgical History  Procedure Laterality Date  . Colonoscopy N/A 02/08/2013    Procedure: COLONOSCOPY;  Surgeon: [name], MD;  Location: [facility];  Service: Endoscopy;  Laterality: N/A;  . Esophagogastroduodenoscopy N/A 02/08/2013    Procedure: ESOPHAGOGASTRODUODENOSCOPY (EGD);  Surgeon: [name], MD;  Location: [facility] ENDOSCOPY;  Service: Endoscopy;  Laterality: N/A;  . Laparotomy N/A 02/12/2013    Procedure:  EXPLORATORY LAPAROTOMY PARTIAL COLECTOMY WITH COLOSTOMY;  Surgeon: [name], MD;  Location: [facility];  Service: General;  Laterality: N/A;  . Laparotomy N/A 02/18/2013    Procedure: EXPLORATORY LAPAROTOMY/Closure of Wound;  Surgeon: [name], MD;  Location: [facility];  Service: General;  Laterality: N/A;  . Coronary angioplasty with stent placement  11/12/2008; 06/11/2014    stent x 2 to RCA; stent x 2 to LAD  . Colon surgery    . Left heart catheterization with coronary angiogram N/A 06/11/2014    Procedure: LEFT HEART CATHETERIZATION WITH CORONARY ANGIOGRAM;  Surgeon: [name], MD; CFX calcified, 30-40 percent, RCA calcified, 40/50/40%, PDA diffuse disease, LAD 40/75/90% s/p DES 2   . Coronary stent placement  06/11/2014    m-LAD 3.5 x 16 mm Synergy DES, d-LAD  2.25 x 16 mm Synergy DES  . Colonoscopy with propofol N/A 05/09/2014    Procedure: COLONOSCOPY WITH PROPOFOL;  Surgeon: [name], MD;  Location: [facility] ENDOSCOPY;  Service: Endoscopy;  Laterality: N/A;    No Known Allergies  Current Outpatient Prescriptions  Medication Sig Dispense Refill  . aspirin EC 81 MG tablet Take 1 tablet (81 mg total) by mouth daily. 30 tablet 0  . atorvastatin (LIPITOR) 40 MG tablet Take 1 tablet (40 mg total) by mouth daily. (Patient taking differently: Take 40 mg by mouth daily at 6 PM. ) 90 tablet 3  . carvedilol (COREG) 12.5 MG tablet Take 1 tablet (12.5 mg total) by mouth 2 (two) times daily. 180 tablet 3  . clopidogrel (PLAVIX) 75 MG tablet Take 1 tablet (75 mg total) by mouth daily with breakfast. 30 tablet 11  . ferrous sulfate 325 (65 FE) MG tablet Take 1 tablet (325 mg total) by mouth daily with breakfast. 30 tablet 1  . furosemide (LASIX) 40 MG tablet Take 1 tablet (40 mg total) by mouth daily. 90 tablet 1  . KLOR-CON M20 20 MEQ tablet TAKE 1 TABLET TWICE DAILY 60 tablet 0  . KLOR-CON M20 20 MEQ tablet TAKE 1 TABLET TWICE DAILY 60 tablet 3  . levalbuterol (XOPENEX HFA) 45 MCG/ACT inhaler  Inhale 2 puffs into the lungs every 4 (four) hours as needed for wheezing. 1 Inhaler 11  . Magnesium 500 MG CAPS Take 500 mg by mouth daily.    . Multiple Vitamins-Minerals (MULTIVITAMIN WITH MINERALS) tablet Take 1 tablet by mouth daily.    . nitroGLYCERIN (NITROSTAT) 0.4 MG SL tablet Place 0.4 mg under the tongue every 5 (five) minutes as needed for chest pain.    . pantoprazole (PROTONIX) 40 MG tablet TAKE 1 TABLET (40 MG TOTAL) BY MOUTH AT BEDTIME. 30 tablet 10  . pantoprazole (PROTONIX) 40 MG tablet Take 1 tablet (40 mg total) by  mouth daily. 30 tablet 11  . ramipril (ALTACE) 1.25 MG capsule TAKE ONE CAPSULE BY MOUTH EVERY 12 HOURS 60 capsule 11  . sildenafil (VIAGRA) 100 MG tablet Take 0.5 tablets (50 mg total) by mouth daily as needed for erectile dysfunction. 10 tablet 3  . simethicone (GAS-X) 80 MG chewable tablet Chew 1 tablet (80 mg total) by mouth 4 (four) times daily. 120 tablet 0  . spironolactone (ALDACTONE) 25 MG tablet TAKE 1 TABLET (25 MG TOTAL) BY MOUTH DAILY. 90 tablet 1   No current facility-administered medications for this visit.    ROS: See HPI for pertinent positives and negatives.   Physical Examination  Filed Vitals:   01/23/15 1141  BP: 118/74  Pulse: 85  Temp: 97.3 F (36.3 C)  TempSrc: Oral  Resp: 16  Height: 5' 11"  (1.803 m)  Weight: 312 lb (141.522 kg)  SpO2: 96%   Body mass index is 43.53 kg/(m^2).  General: A&O x 3, WD morbidly obese male in NAD  Head: [place]/AT  Pulmonary: Distant breath sounds due to habitus, no rales, rhonchi, or wheezing detected.  Cardiac: Distant heart sounds due to habitus, RRR,  no detected murmur, no carotid bruits.   Vascular: Pulse exam difficult due to habitus and peripheral swelling  Vessel Right Left  Radial Palpable Palpable  Aorta Not palpable N/A  Femoral Not palpable Not palpable  Popliteal Not palpable Not palpable  PT Not palpable Not palpable  DP Not palpable Not palpable    Gastrointestinal: soft, non tender, obese, colostomy to right abdomen.   Musculoskeletal: M/S 5/5 throughout. Significant edema to lower legs bilaterally. Extremities without ischemic changes. Venous stasis ulcers of both lower legs have healed. No cellulitis, no drainage, no odor. Venous stasis changes to lower legs bilaterally. No wounds on feet. Soles of feet have thick scaly calluses.  Neurologic: CN 2-12 intact. Pain and light touch intact in extremities. Motor exam as listed above  Psychiatric: Judgment intact, Mood & affect appropriate for pt's clinical situation  Dermatologic: See M/S exam for extremity exam, no rashes otherwise noted          Non-Invasive Vascular Imaging: DATE: 01/23/2015 ABI: RIGHT: 0.98 (0.93, 06/06/14), Waveforms: monophasic, TBI: 0.46;  LEFT: 1.48 (1.37), Waveforms: monophasic, TBI: 0.56 Monophasic waveforms do no correspond to normal ABI  On the right and supra systolic on the left. These findings suggest possible medial calcification. TBI's are below normal.    ASSESSMENT: Talor L Tonne is a 64 y.o. male has bilateral chronic venous stasis; the venous ulcers on his lower legs have healed with treatment at the wound care center at [facility]. He now wears compression hose. I instructed him how to adequately elevate his legs to minimize edema. ABI's indicate vessel calcification; he does not seem to have DM, but does have some degree of CRF which could be the etiology of vessel calcification. He has multiple medical problems. He does not seem to walk enough to elicit claudication. I instructed him in a graduated walking program.   PLAN:  Continue compression hose during the daytime.  Elevate feet above heart as much as possible, when not walking. Graduated walking program discussed in detail.  Based on the patient's vascular studies and examination, pt will return to clinic in 6 months with ABI's. He knows to return sooner if he develops non healing  wounds  I discussed in depth with the patient the nature of atherosclerosis, and emphasized the importance of maximal medical management including strict control of blood  pressure, blood glucose, and lipid levels, obtaining regular exercise, and continued cessation of smoking.  The patient is aware that without maximal medical management the underlying atherosclerotic disease process will progress, limiting the benefit of any interventions.  The patient was given information about PAD including signs, symptoms, treatment, what symptoms should prompt the patient to seek immediate medical care, and risk reduction measures to take.  Clemon Chambers, RN, MSN, FNP-C [facility] Phone: [phone]  Clinic MD: Bridgett Larsson  01/23/2015 11:59 AM

## 2015-02-03 ENCOUNTER — Ambulatory Visit (INDEPENDENT_AMBULATORY_CARE_PROVIDER_SITE_OTHER): Payer: Medicare Other | Admitting: Nurse Practitioner

## 2015-02-03 ENCOUNTER — Encounter: Payer: Self-pay | Admitting: Nurse Practitioner

## 2015-02-03 VITALS — BP 122/60 | HR 87 | Ht 70.5 in | Wt 363.0 lb

## 2015-02-03 DIAGNOSIS — R635 Abnormal weight gain: Secondary | ICD-10-CM | POA: Diagnosis not present

## 2015-02-03 DIAGNOSIS — I259 Chronic ischemic heart disease, unspecified: Secondary | ICD-10-CM

## 2015-02-03 DIAGNOSIS — R0600 Dyspnea, unspecified: Secondary | ICD-10-CM

## 2015-02-03 LAB — BASIC METABOLIC PANEL
BUN: 16 mg/dL (ref 7–25)
CO2: 26 mmol/L (ref 20–31)
Calcium: 9.9 mg/dL (ref 8.6–10.3)
Chloride: 100 mmol/L (ref 98–110)
Creat: 1.63 mg/dL — ABNORMAL HIGH (ref 0.70–1.25)
Glucose, Bld: 85 mg/dL (ref 65–99)
Potassium: 4.2 mmol/L (ref 3.5–5.3)
Sodium: 139 mmol/L (ref 135–146)

## 2015-02-03 LAB — BRAIN NATRIURETIC PEPTIDE: Brain Natriuretic Peptide: 6.4 pg/mL (ref 0.0–100.0)

## 2015-02-03 NOTE — Patient Instructions (Addendum)
We will be checking the following labs today - BMET, BNP   Medication Instructions:    Continue with your current medicines.     Testing/Procedures To Be Arranged:  N/A  Follow-Up:   See me in 4 months    Other Special Instructions:   Start walking more - start going to the mailbox twice a day - work up to 3 times a day    If you need a refill on your cardiac medications before your next appointment, please call your pharmacy.   Call the [place] office at [phone] if you have any questions, problems or concerns.

## 2015-02-03 NOTE — Progress Notes (Signed)
CARDIOLOGY OFFICE NOTE  Date:  02/03/2015    Larry [name] Date of Birth: 03-31-1950 Medical Record #[id number]  PCP:  Kristine Garbe, MD  Cardiologist:  Allred    Chief Complaint  Patient presents with  . Coronary Artery Disease    4 month check - seen for Dr. Rayann Heman  . Congestive Heart Failure    History of Present Illness: Larry [name] is a 64 y.o. male who presents today for a follow up visit.  This is a 4 month check. Seen for Dr. Rayann Heman.   He has an ischemic CM with severe diffuse 3VD and prior stents to the RCA in 2010 per Dr. Angelena Form, systolic and diastolic dysfunction, HTN, HLD and obesity. Has had a history of atrial tach that has been treated with beta blocker. Was hospitalized back in April of 2013 with a HF exacerbation - had stopped his medicines. Last EF of 40 - to 63% with diastolic dysfunction as well noted in January of 2015.   Seen by me back in October of 2014 - Was out of his medicines again. Had lost weight and was anemic - was sending to GI and ended up having colon cancer.   He was admitted (02/02/2013 - 03/01/2013) to [facility] with subacute history of progressive weakness, constipation and difficulty eating and drinking. He presented with acute renal failure (Creatinine 7.83) and severe anemia (Hbg of 7.3) as well as obstruction. Colonoscopy doing this admission with biopsy revealed adenocarcinoma and patient underwent exploratory lap with partial colectomy with colostomy on 12/23 by Dr. Hulen Skains. Recovery was complicated by wound dehissance, requiring a return to the OR 12/29 with abdominal wound closure with VAC placment. After the procedure, the patient developed atrial tachycardia, so a dilt drip was started (12/29-1/2). On 1/4, the patient had a cardiac arrest, requiring defibrillation, and was subsequently transferred to the ICU. QT was found to be > 700, with mild hypokalemia and hypomagnesemia implicated as the cause. Lidocaine  drip was started. Electrolyte abnormality treated and 2D-echo revealed an EF of 40-45% with periapical akinesis and biatrial enlargement. He experienced recurrent torsades on 01/05 and shocked in NSR. By 1/6, QTc had decreased to 499, lidocaine drip was d/c'ed, and patient was transferred out of ICU to stepdown. Then admitted to physical medicine and rehabilitation on 01/09 due to severe deconditioning. He was continued on protein supplement to promote wound healing. His abdominal wound was treated with VAC through 03/05/13. His heart rate was controlled without recurrent arrythmia and was maintained on potassium and magnesium supplements. He was discharged to home on 03/07/2013. He was last evaluated by Dr. Hulen Skains on 02/03 with review of his pathology demonstrating invasive adenocarcinoma of the transverse colon with penetration into the pericolonic fat. He had 0/18 lymph nodes negative for metastatic disease. He was pathologic stage [id number].   Saw Dr. Gwenlyn Found in early July of 2015 for a foot ulcer - basically normal ABI's done.   He was readmitted to the hospital in August of 2015 - triggered by too much salt. Presented with respiratory distress - treated with Bipap and quickly stabilized. Was diuresed about 6 liters as well. Started on low dose ACE. Sleep study recommended.  I have seen back here several times since that hospitalization - he had improved. Titrated as much medicine as BP could tolerate. Felt to be stable from our standpoint. Referred to Sleep Medicine/Dr. Annamaria Boots and had CPAP ordered for his OSA.   Seen in March of 2016 -  wanting to get his colostomy reversed and needing pre op clearance. He was feeling pretty good - no real cardiac symptoms noted. Did have a superficial leg ulcer and is being treated by the wound clinic and had seen Dr. Bridgett Larsson for evaluation as well. Had a Myoview to risk stratify - this was high risk and I referred him on for cardiac cath with Dr. Burt Knack. Underwent PCI to the  LAD with DES x 2. Now committed to DAPT for one year ideally.  Last seen by me back in August - was doing ok. Weight was continuing to climb. He was fine with holding off on colostomy reversal.   Comes in today. Here alone. Doing ok. Says he is doing ok. No chest pain. Not short of breath. Not doing much in the way of activity. Weight continues to climb. No real increase in his swelling. No leg/foot ulcers. He does walk to the mailbox once a day - the distance is probably from [place] to our parking lot. No palpitations. Tolerating his medicines. Other than the sedentary lifestyle, he is doing ok.    Past Medical History  Diagnosis Date  . Hypertension   . Hypercholesteremia   . CHF (congestive heart failure) (HCC)     has diastolic heart failure grade 1; EF is 45 to 50% per echo 05/2011; EF 41% by Myoview 2016  . Coronary artery disease   . COPD (chronic obstructive pulmonary disease) ([place])   . Obesity   . Noncompliance   . NSVT (nonsustained ventricular tachycardia) (HCC)     beta blocker restarted  . Atrial tachycardia ([place])     managed on beta blocker therapy  . Peripheral arterial disease (HCC)     small ulcer the head of right metatarsal plantar surface right foot  . Cardiac arrest ([place])     X 2 episodes during hospital visit 12'14"electrolyte imbalance"- "Shocked"  . S/P colostomy ([place])     2014  . OSA on CPAP     used nightly  . Childhood asthma     "went away after I was 16"  . CKD (chronic kidney disease) stage 3, GFR 30-59 ml/min   . Thrombocytopenia ([place])   . Family history of ovarian cancer   . Colon cancer ([place])     MSI high; IHC loss of MLH1 and PMS2; BRAF negative; Negative methylation    Past Surgical History  Procedure Laterality Date  . Colonoscopy N/A 02/08/2013    Procedure: COLONOSCOPY;  Surgeon: [name], MD;  Location: [place];  Service: Endoscopy;  Laterality: N/A;  . Esophagogastroduodenoscopy N/A 02/08/2013    Procedure:  ESOPHAGOGASTRODUODENOSCOPY (EGD);  Surgeon: [name], MD;  Location: [facility] ENDOSCOPY;  Service: Endoscopy;  Laterality: N/A;  . Laparotomy N/A 02/12/2013    Procedure: EXPLORATORY LAPAROTOMY PARTIAL COLECTOMY WITH COLOSTOMY;  Surgeon: [name], MD;  Location: [place];  Service: General;  Laterality: N/A;  . Laparotomy N/A 02/18/2013    Procedure: EXPLORATORY LAPAROTOMY/Closure of Wound;  Surgeon: [name], MD;  Location: [place];  Service: General;  Laterality: N/A;  . Coronary angioplasty with stent placement  11/12/2008; 06/11/2014    stent x 2 to RCA; stent x 2 to LAD  . Colon surgery    . Left heart catheterization with coronary angiogram N/A 06/11/2014    Procedure: LEFT HEART CATHETERIZATION WITH CORONARY ANGIOGRAM;  Surgeon: [name], MD; CFX calcified, 30-40 percent, RCA calcified, 40/50/40%, PDA diffuse disease, LAD 40/75/90% s/p DES 2   .  Coronary stent placement  06/11/2014    m-LAD 3.5 x 16 mm Synergy DES, d-LAD  2.25 x 16 mm Synergy DES  . Colonoscopy with propofol N/A 05/09/2014    Procedure: COLONOSCOPY WITH PROPOFOL;  Surgeon: [name], MD;  Location: [facility] ENDOSCOPY;  Service: Endoscopy;  Laterality: N/A;     Medications: Current Outpatient Prescriptions  Medication Sig Dispense Refill  . aspirin EC 81 MG tablet Take 1 tablet (81 mg total) by mouth daily. 30 tablet 0  . atorvastatin (LIPITOR) 40 MG tablet Take 1 tablet (40 mg total) by mouth daily. (Patient taking differently: Take 40 mg by mouth daily at 6 PM. ) 90 tablet 3  . carvedilol (COREG) 12.5 MG tablet Take 1 tablet (12.5 mg total) by mouth 2 (two) times daily. 180 tablet 3  . clopidogrel (PLAVIX) 75 MG tablet Take 1 tablet (75 mg total) by mouth daily with breakfast. 30 tablet 11  . ferrous sulfate 325 (65 FE) MG tablet Take 1 tablet (325 mg total) by mouth daily with breakfast. 30 tablet 1  . furosemide (LASIX) 40 MG tablet Take 1 tablet (40 mg total) by mouth daily. 90 tablet 1  . KLOR-CON M20 20  MEQ tablet TAKE 1 TABLET TWICE DAILY 60 tablet 3  . levalbuterol (XOPENEX HFA) 45 MCG/ACT inhaler Inhale 2 puffs into the lungs every 4 (four) hours as needed for wheezing. 1 Inhaler 11  . Magnesium 500 MG CAPS Take 500 mg by mouth daily.    . Multiple Vitamins-Minerals (MULTIVITAMIN WITH MINERALS) tablet Take 1 tablet by mouth daily.    . nitroGLYCERIN (NITROSTAT) 0.4 MG SL tablet Place 0.4 mg under the tongue every 5 (five) minutes as needed for chest pain.    . pantoprazole (PROTONIX) 40 MG tablet Take 1 tablet (40 mg total) by mouth daily. 30 tablet 11  . ramipril (ALTACE) 1.25 MG capsule TAKE ONE CAPSULE BY MOUTH EVERY 12 HOURS 60 capsule 11  . sildenafil (VIAGRA) 100 MG tablet Take 0.5 tablets (50 mg total) by mouth daily as needed for erectile dysfunction. 10 tablet 3  . simethicone (GAS-X) 80 MG chewable tablet Chew 1 tablet (80 mg total) by mouth 4 (four) times daily. 120 tablet 0  . spironolactone (ALDACTONE) 25 MG tablet TAKE 1 TABLET (25 MG TOTAL) BY MOUTH DAILY. 90 tablet 1   No current facility-administered medications for this visit.    Allergies: No Known Allergies  Social History: The patient  reports that he quit smoking about 6 years ago. His smoking use included Cigarettes. He has a 38 pack-year smoking history. He has never used smokeless tobacco. He reports that he drinks alcohol. He reports that he does not use illicit drugs.   Family History: The patient's family history includes Dementia in his mother; Diabetes in his father; Heart disease in his father; Hypertension in his father and mother; Leukemia in his maternal uncle; Liver cancer in his maternal grandmother; Parkinsonism in his mother; Prostate cancer in his maternal uncle.   Review of Systems: Please see the history of present illness.   Otherwise, the review of systems is positive for none.   All other systems are reviewed and negative.   Physical Exam: VS:  BP 122/60 mmHg  Pulse 87  Ht 5' 10.5" (1.791  m)  Wt 363 lb (164.656 kg)  BMI 51.33 kg/m2 .  BMI Body mass index is 51.33 kg/(m^2).  Wt Readings from Last 3 Encounters:  02/03/15 363 lb (164.656 kg)  01/23/15 312 lb (  141.522 kg)  09/29/14 358 lb 3.2 oz (162.478 kg)   The weight from 12/2 is NOT accurate - he says it was 358.  General: Pleasant. Well developed, well nourished and in no acute distress.  HEENT: Normal. Neck: Supple, no JVD, carotid bruits, or masses noted.  Cardiac: Regular rate and rhythm. No murmurs, rubs, or gallops. No edema.  Respiratory:  Lungs are clear to auscultation bilaterally with normal work of breathing.  GI: Soft and nontender.  MS: No deformity or atrophy. Gait and ROM intact. Skin: Warm and dry. Color is normal.  Neuro:  Strength and sensation are intact and no gross focal deficits noted.  Psych: Alert, appropriate and with normal affect.   LABORATORY DATA:  EKG:  EKG is not ordered today.  Lab Results  Component Value Date   WBC 5.3 09/24/2014   HGB 13.2 09/24/2014   HCT 40.8 09/24/2014   PLT 151.0 09/24/2014   GLUCOSE 92 09/24/2014   CHOL 157 09/24/2014   TRIG 110.0 09/24/2014   HDL 46.60 09/24/2014   LDLCALC 88 09/24/2014   ALT 16 09/24/2014   AST 14 09/24/2014   NA 139 09/24/2014   K 3.8 09/24/2014   CL 103 09/24/2014   CREATININE 1.65* 09/24/2014   BUN 17 09/24/2014   CO2 27 09/24/2014   TSH 0.70 12/06/2012   INR 1.0 06/09/2014   HGBA1C 6.0 11/26/2009    BNP (last 3 results) No results for input(s): BNP in the last 8760 hours.  ProBNP (last 3 results) No results for input(s): PROBNP in the last 8760 hours.   Other Studies Reviewed Today:  Procedure: Left Heart Cath, Selective Coronary Angiography, PTCA and stenting of the mid and distal LAD.  Indication: Severe exertional dyspnea, high risk Myoview scan in patient with known CAD. Myoview scan demonstrated large inferolateral scar as well as antero-lateral and anteroapical ischemia suggestive of multivessel coronary  artery disease.  Procedural Details: The right wrist was prepped, draped, and anesthetized with 1% lidocaine. Using the modified Seldinger technique, a 5/6 French Slender sheath was introduced into the right radial artery. 3 mg of verapamil was administered through the sheath, weight-based unfractionated heparin was administered intravenously. Standard Judkins catheters were used for selective coronary angiography. Catheter exchanges were performed over an exchange length guidewire.  PROCEDURAL FINDINGS Hemodynamics: AO 102/62 LV 102/6  Coronary angiography: Coronary dominance: right  Left mainstem: The left mainstem is patent. The vessel divides into the LAD and left circumflex. There is no significant stenosis, but there is mild irregularity.  Left anterior descending (LAD): The LAD is moderately calcified. The vessel is severely diseased. The proximal LAD is patent with 30-40% stenosis after the second septal perforator. There is diffuse calcification. The diagonal branches are small. The mid LAD has an eccentric 75% stenosis at the origin of the second diagonal branch. Beyond that area the mid and distal LAD are diffusely diseased. There is another severe stenosis in the apical LAD of 90%.  Left circumflex (LCx): The left circumflex is calcified. The vessel is patent with mild irregularity. There are no high-grade stenoses identified. There are scattered 30-40% stenoses throughout the proximal and mid circumflex as well as the first OM branch.  Right coronary artery (RCA): The RCA is dominant. The vessel is heavily calcified. The stented segments in the mid and distal RCA are patent. The proximal vessel has 30-40% stenosis. The mid vessel has 30-40% stenosis. The distal vessel is patent with 50-60% stenosis involving the origin of the PDA. The  PDA is diffusely diseased.  Left ventriculography: Deferred because of chronic kidney disease. By nuclear scan the LVEF is 41%.  PCI  Note: Following the diagnostic procedure, the decision was made to proceed with PCI of the mid and apical LAD. The right coronary artery had patent stents in the left circumflex had nonobstructive disease. The LAD is a diffusely diseased vessel and the mid and distal vessel would be a poor surgical targets. I felt that PCI was the best option in this patient with a high risk nuclear scan demonstrating anteroapical and anterolateral ischemia. He will require colostomy reversal, so I planned on treating him with Synergy drug-eluting stents which had a biodegradable polymer and potentially can allow for earlier interruption of dual antiplatelet therapy. The patient was loaded with Plavix 600 mg. Weight-based bivalirudin was given for anticoagulation. Once a therapeutic ACT was achieved, a 6 [place] XB LAD 3.5 cm guide catheter was inserted. A cougar coronary guidewire was used to cross the lesion in the apical LAD. The lesion was predilated with a 2.5 x 12 mm balloon. The same balloon was used to dilate the mid lesion. The apical lesion was then stented with a 2.25 x 16 mm Synergy DES. The stent was postdilated with a 2.5 mm noncompliant balloon. The mid lesion was then stented with a 3.5 x 16 mm Synergy DES. That stent was postdilated with a 3.75 mm noncompliant balloon. Following PCI, there was 0% residual stenosis and TIMI-3 flow. Final angiography confirmed an excellent result. The patient tolerated the procedure well. There were no immediate procedural complications. A TR band was used for radial hemostasis. The patient was transferred to the post catheterization recovery area for further monitoring.  PCI Data: Lesion 1: Vessel - LAD/Segment - distal/apical Percent Stenosis (pre) 90 TIMI-flow 3 Stent 2.25 x 16 mm Synergy DES Percent Stenosis (post) 0 TIMI-flow (post) 3  Lesion 2: Vessel - LAD/Segment - mid Percent Stenosis (pre) 75 TIMI-flow 3 Stent 3.5 x 16 mm Synergy DES Percent Stenosis  (post) 0 TIMI-flow (post) 3  Estimated Blood Loss: Minimal  Final Conclusions:  1. Two-vessel coronary artery disease with continued patency of the stented segments in the right coronary artery and severe stenoses of the mid and apical LAD 2. Mild diffuse nonobstructive left circumflex stenosis 3. Successful PCI of the LAD using 2 drug-eluting stents   Recommendations:  Dual antiplatelet therapy for at least 6 months. Would be reasonable to consider interruption of aspirin and Plavix at 6 months for colostomy reversal.  [name] MD, [facility] 06/11/2014, 10:06 AM   Myoview Impression April 2016 Exercise Capacity: Lexiscan with no exercise. BP Response: Normal blood pressure response. Clinical Symptoms: There is dyspnea. ECG Impression: No significant ST segment change suggestive of ischemia. Comparison with Prior Nuclear Study: No images to compare  Overall Impression: High risk stress nuclear study large inferior wall infarct from apex to base. Also suggestion of apical, anterior and lateral wall ischemia.  LV Ejection Fraction: 41%. LV Wall Motion: Inferiior akinesis Consistent with possible 3 VD Consider cath before major surgery   [place]   Echo Study Conclusions from January 2015  - Left ventricle: The cavity size was mildly dilated. Wall thickness was increased in a pattern of mild LVH. Systolic function was mildly to moderately reduced. The estimated ejection fraction was in the range of 40% to 45%. There was akinesis of the apical lateral, apical anterior, and apical septal segments. There was akinesis of the true apex. Prominent trabeculation at the  apex but I do not think thrombus was present. Patient had Definity study recently to look at the apex and no thrombus was seen. Doppler parameters are consistent with abnormal left ventricular relaxation (grade 1 diastolic dysfunction). - Aortic valve: Trileaflet; moderately calcified leaflets. There  was no stenosis. - Mitral valve: Moderately calcified annulus. Mildly calcified leaflets . Trivial regurgitation. - Left atrium: The atrium was moderately dilated. - Right ventricle: The cavity size was normal. Systolic function was normal. - Right atrium: The atrium was mildly dilated. - Pulmonary arteries: No complete TR doppler jet so unable to estimate PA systolic pressure. - Systemic veins: IVC was not visualized. - Pericardium, extracardiac: A trivial pericardial effusion was identified. Impressions:  - Mildly dilated LV with mild LV hypertrophy. EF 40-45% with peri-apical akinesis. I do not think that LV apical thrombus is present. Normal RV size and systolic function. Biatrial enlargement.   Assessment / Plan:  1. CAD with prior PCI to the RCA - S/P cath with PCI to the LAD back in April following high risk Myoview - has had DES x 2 to the LAD - on aspirin/Plavix. Doing well but really needs to work on lifestyle modification. I have left him on his current regimen. We have talked about needing to increase his activity and trying to get him "tuned up" for possible surgery later next year.   2. Combined systolic and diastolic HF - seems to be stable. His weight is up - I think this is more from inactivity. Check BNP today.   3. Colon cancer - followed by oncology - was hopeful to have his colostomy reversed. He tells me he is ok to wait the full 12 months and I agree with waiting a full 12 months - even then I think this will be challenging. Will discuss further on return OV.   4. HTN - stable on present regimen  5. CKD - on very low dose ACE   6. OSA - now on CPAP and followed by Dr. Annamaria Boots  7. HLD - on statin   8. Atrial tachycardia - remains on beta blocker therapy - no real complaints of palpitations.   Current medicines are reviewed with the patient today.  The patient does not have concerns regarding medicines other than what has been noted above.  The following  changes have been made:  See above.  Labs/ tests ordered today include:    Orders Placed This Encounter  Procedures  . Basic metabolic panel  . Brain natriuretic peptide     Disposition:   FU with me in 4 months with EKG.   Patient is agreeable to this plan and will call if any problems develop in the interim.   Signed: Burtis Junes, RN, ANP-C 02/03/2015 9:39 AM  [facility] [address] [place], [place]  [address] Phone: [phone] Fax: [phone]

## 2015-03-30 ENCOUNTER — Other Ambulatory Visit: Payer: Self-pay | Admitting: Nurse Practitioner

## 2015-04-02 ENCOUNTER — Ambulatory Visit: Payer: Medicare Other | Admitting: Nurse Practitioner

## 2015-04-02 ENCOUNTER — Other Ambulatory Visit: Payer: Medicare Other

## 2015-04-16 ENCOUNTER — Other Ambulatory Visit: Payer: Self-pay | Admitting: Nurse Practitioner

## 2015-04-19 ENCOUNTER — Other Ambulatory Visit: Payer: Self-pay | Admitting: Internal Medicine

## 2015-04-22 ENCOUNTER — Other Ambulatory Visit: Payer: Self-pay | Admitting: Cardiovascular Disease

## 2015-04-22 ENCOUNTER — Other Ambulatory Visit: Payer: Self-pay | Admitting: Nurse Practitioner

## 2015-04-23 ENCOUNTER — Other Ambulatory Visit: Payer: Self-pay | Admitting: Nurse Practitioner

## 2015-04-29 ENCOUNTER — Telehealth: Payer: Self-pay | Admitting: Nurse Practitioner

## 2015-04-29 NOTE — Telephone Encounter (Signed)
pt cld to r/s missed appt-gave pt time & date

## 2015-05-06 ENCOUNTER — Ambulatory Visit (HOSPITAL_BASED_OUTPATIENT_CLINIC_OR_DEPARTMENT_OTHER): Payer: Medicare Other | Admitting: Nurse Practitioner

## 2015-05-06 ENCOUNTER — Telehealth: Payer: Self-pay | Admitting: Nurse Practitioner

## 2015-05-06 ENCOUNTER — Telehealth: Payer: Self-pay | Admitting: Oncology

## 2015-05-06 ENCOUNTER — Other Ambulatory Visit (HOSPITAL_BASED_OUTPATIENT_CLINIC_OR_DEPARTMENT_OTHER): Payer: Medicare Other

## 2015-05-06 VITALS — BP 134/67 | HR 93 | Temp 98.5°F | Resp 18 | Ht 70.5 in | Wt 364.2 lb

## 2015-05-06 DIAGNOSIS — Z85038 Personal history of other malignant neoplasm of large intestine: Secondary | ICD-10-CM

## 2015-05-06 DIAGNOSIS — C189 Malignant neoplasm of colon, unspecified: Secondary | ICD-10-CM | POA: Diagnosis not present

## 2015-05-06 DIAGNOSIS — C184 Malignant neoplasm of transverse colon: Secondary | ICD-10-CM

## 2015-05-06 LAB — CBC WITH DIFFERENTIAL/PLATELET
BASO%: 0.3 % (ref 0.0–2.0)
Basophils Absolute: 0 10*3/uL (ref 0.0–0.1)
EOS%: 3.5 % (ref 0.0–7.0)
Eosinophils Absolute: 0.2 10*3/uL (ref 0.0–0.5)
HCT: 41.1 % (ref 38.4–49.9)
HGB: 13.2 g/dL (ref 13.0–17.1)
LYMPH%: 18.8 % (ref 14.0–49.0)
MCH: 28.8 pg (ref 27.2–33.4)
MCHC: 32.1 g/dL (ref 32.0–36.0)
MCV: 89.5 fL (ref 79.3–98.0)
MONO#: 0.8 10*3/uL (ref 0.1–0.9)
MONO%: 11.8 % (ref 0.0–14.0)
NEUT%: 65.6 % (ref 39.0–75.0)
NEUTROS ABS: 4.3 10*3/uL (ref 1.5–6.5)
Platelets: 130 10*3/uL — ABNORMAL LOW (ref 140–400)
RBC: 4.59 10*6/uL (ref 4.20–5.82)
RDW: 14.5 % (ref 11.0–14.6)
WBC: 6.5 10*3/uL (ref 4.0–10.3)
lymph#: 1.2 10*3/uL (ref 0.9–3.3)

## 2015-05-06 NOTE — Progress Notes (Signed)
  [place] OFFICE PROGRESS NOTE   Diagnosis:  Colon cancer  INTERVAL HISTORY:   Larry [name] returns for follow-up. No change in bowel habits. No bloody or black stools. No abdominal pain. No shortness of breath. He has a good appetite.  Objective:  Vital signs in last 24 hours:  Blood pressure 134/67, pulse 93, temperature 98.5 F (36.9 C), resp. rate 18, height 5' 10.5" (1.791 m), weight 364 lb 3.2 oz (165.2 kg), SpO2 100 %.    HEENT: No thrush or ulcers. Lymphatics: No palpable cervical, supraclavicular, axillary or inguinal lymph nodes.. Cutaneous cyst low right axilla. Resp: Lungs clear bilaterally. Cardio: Regular rate and rhythm. GI: Abdomen soft and nontender. No organomegaly. Right abdomen colostomy. Vascular: Trace lower leg edema bilaterally with chronic stasis change. Skin: Midline abdominal scar with scattered areas of scabbing.    Lab Results:  Lab Results  Component Value Date   WBC 6.5 05/06/2015   HGB 13.2 05/06/2015   HCT 41.1 05/06/2015   MCV 89.5 05/06/2015   PLT 130* 05/06/2015   NEUTROABS 4.3 05/06/2015    Imaging:  No results found.  Medications: I have reviewed the patient's current medications.  Assessment/Plan: 1. Adenocarcinoma of the transverse colon, stage IIc (T4 N0), status post a partial colectomy/colostomy 02/12/2013  Microsatellite instability-high, loss of MLH1 and PMS2 expression  No BRAF mutation  Negative for MLH1 methylation  Gene panel confirmed a variant of unknown significance in the PMS2. He was referred to the genetics counselor.  Colonoscopy on 05/09/2014-negative  2. COPD  3. Coronary artery disease  4. History of peripheral arterial disease   Disposition: Larry [name] remains in clinical remission from colon cancer. We will follow-up on the CEA from today. He will return for a follow-up visit and CEA in 6 months.    Ned Card ANP/GNP-BC   05/06/2015  3:02 PM

## 2015-05-06 NOTE — Telephone Encounter (Signed)
Gave adn printed appt sched and avs for pt for Sept °

## 2015-05-06 NOTE — Telephone Encounter (Signed)
As per Owens Shark NP call placed to Dr. Benson Norway in reference to getting in touch with Pt. To schedule a follow up appointment for his colonoscopy. Spoke with Lattie Haw at Dr. Benson Norway office stated an appointment reminder was sent out yesterday and she would give the Pt. A call.

## 2015-05-07 ENCOUNTER — Telehealth: Payer: Self-pay | Admitting: *Deleted

## 2015-05-07 LAB — CEA: CEA: 3.3 ng/mL (ref 0.0–4.7)

## 2015-05-07 LAB — CEA (PARALLEL TESTING): CEA: 1.9 ng/mL

## 2015-05-07 NOTE — Telephone Encounter (Signed)
Per Dr. Benay Spice, pt notified that his CEA is normal.  Pt appreciative of call and has no questions at this time.

## 2015-05-07 NOTE — Telephone Encounter (Signed)
-----   Message from Ladell Pier, MD sent at 05/07/2015  4:30 PM EDT ----- Please call patient, cea is normal

## 2015-05-13 DIAGNOSIS — I2510 Atherosclerotic heart disease of native coronary artery without angina pectoris: Secondary | ICD-10-CM | POA: Diagnosis not present

## 2015-05-13 DIAGNOSIS — Z85038 Personal history of other malignant neoplasm of large intestine: Secondary | ICD-10-CM | POA: Diagnosis not present

## 2015-05-13 DIAGNOSIS — G4733 Obstructive sleep apnea (adult) (pediatric): Secondary | ICD-10-CM | POA: Diagnosis not present

## 2015-05-14 ENCOUNTER — Other Ambulatory Visit: Payer: Self-pay | Admitting: Physician Assistant

## 2015-05-14 NOTE — Telephone Encounter (Signed)
REFILL 

## 2015-05-29 ENCOUNTER — Other Ambulatory Visit: Payer: Self-pay | Admitting: Nurse Practitioner

## 2015-06-04 ENCOUNTER — Telehealth: Payer: Self-pay

## 2015-06-04 NOTE — Telephone Encounter (Signed)
Pt appt rescheduled from 4/26 to 5/8 @ 1:30pm. Pt verbalized understanding, no questions at this time.

## 2015-06-17 ENCOUNTER — Ambulatory Visit: Payer: Medicare Other | Admitting: Nurse Practitioner

## 2015-06-29 ENCOUNTER — Encounter: Payer: Self-pay | Admitting: Nurse Practitioner

## 2015-06-29 ENCOUNTER — Ambulatory Visit (INDEPENDENT_AMBULATORY_CARE_PROVIDER_SITE_OTHER): Payer: Medicare Other | Admitting: Nurse Practitioner

## 2015-06-29 VITALS — BP 94/70 | HR 115 | Ht 71.0 in | Wt 369.0 lb

## 2015-06-29 DIAGNOSIS — I498 Other specified cardiac arrhythmias: Secondary | ICD-10-CM | POA: Diagnosis not present

## 2015-06-29 DIAGNOSIS — I5022 Chronic systolic (congestive) heart failure: Secondary | ICD-10-CM | POA: Diagnosis not present

## 2015-06-29 DIAGNOSIS — I471 Supraventricular tachycardia: Secondary | ICD-10-CM | POA: Diagnosis not present

## 2015-06-29 DIAGNOSIS — I259 Chronic ischemic heart disease, unspecified: Secondary | ICD-10-CM

## 2015-06-29 NOTE — Progress Notes (Signed)
CARDIOLOGY OFFICE NOTE  Date:  06/29/2015    [name] Date of Birth: 1950-03-30 Medical Record #[id number]  PCP:  Kristine Garbe, MD  Cardiologist:  Allred    Chief Complaint  Patient presents with  . Irregular Heart Beat  . Congestive Heart Failure  . Hypertension  . Hyperlipidemia    History of Present Illness: RYLIE KNIERIM is a 65 y.o. male who presents today for a follow up visit.  Seen for Dr. Rayann Heman.   He has an ischemic CM with severe diffuse 3VD and prior stents to the RCA in 2010 per Dr. Angelena Form, systolic and diastolic dysfunction, HTN, HLD and obesity. Has had a history of atrial tach that has been treated with beta blocker. Was hospitalized back in April of 2013 with a HF exacerbation - had stopped his medicines. Last EF of 40 - to 46% with diastolic dysfunction as well noted in January of 2015.   Seen by me back in October of 2014 - Was out of his medicines again. Had lost weight and was anemic - was sending to GI and ended up having colon cancer.   He was admitted (02/02/2013 - 03/01/2013) to [facility] with subacute history of progressive weakness, constipation and difficulty eating and drinking. He presented with acute renal failure (Creatinine 7.83) and severe anemia (Hbg of 7.3) as well as obstruction. Colonoscopy doing this admission with biopsy revealed adenocarcinoma and patient underwent exploratory lap with partial colectomy with colostomy on 12/23 by Dr. Hulen Skains. Recovery was complicated by wound dehissance, requiring a return to the OR 12/29 with abdominal wound closure with VAC placment. After the procedure, the patient developed atrial tachycardia, so a dilt drip was started (12/29-1/2). On 1/4, the patient had a cardiac arrest, requiring defibrillation, and was subsequently transferred to the ICU. QT was found to be > 700, with mild hypokalemia and hypomagnesemia implicated as the cause. Lidocaine drip was started. Electrolyte  abnormality treated and 2D-echo revealed an EF of 40-45% with periapical akinesis and biatrial enlargement. He experienced recurrent torsades on 01/05 and shocked in NSR. By 1/6, QTc had decreased to 499, lidocaine drip was d/c'ed, and patient was transferred out of ICU to stepdown. Then admitted to physical medicine and rehabilitation on 01/09 due to severe deconditioning. He was continued on protein supplement to promote wound healing. His abdominal wound was treated with VAC through 03/05/13. His heart rate was controlled without recurrent arrythmia and was maintained on potassium and magnesium supplements. He was discharged to home on 03/07/2013. He was last evaluated by Dr. Hulen Skains on 02/03 with review of his pathology demonstrating invasive adenocarcinoma of the transverse colon with penetration into the pericolonic fat. He had 0/18 lymph nodes negative for metastatic disease. He was pathologic stage [id number].   Saw Dr. Gwenlyn Found in early July of 2015 for a foot ulcer - basically normal ABI's done.   He was readmitted to the hospital in August of 2015 - triggered by too much salt. Presented with respiratory distress - treated with Bipap and quickly stabilized. Was diuresed about 6 liters as well. Started on low dose ACE. Sleep study recommended.  I have seen back here several times since that hospitalization - he had improved. Titrated as much medicine as BP could tolerate. Felt to be stable from our standpoint. Referred to Sleep Medicine/Dr. Annamaria Boots and had CPAP ordered for his OSA.   Seen in March of 2016 - wanting to get his colostomy reversed and needing pre op  clearance. He was feeling pretty good - no real cardiac symptoms noted. Did have a superficial leg ulcer and was being treated by the wound clinic and had seen Dr. Bridgett Larsson for evaluation as well. Had a Myoview to risk stratify - this was high risk and I referred him on for cardiac cath with Dr. Burt Knack. Underwent PCI to the LAD with DES x 2. Now  committed to DAPT for one year ideally.  Last seen by me back in December - was doing ok. Weight was continuing to climb. He was fine with holding off on colostomy reversal. Has gotten more and more sedentary.   Comes in today. Here alone. He says he is doing pretty well. No chest pain. His breathing is ok as long as he does not exert himself - fine at rest. Notes that his legs are less swollen. He continues to be very inactive. Not dizzy or lightheaded. No palpitations noted. He notes that he would like to have his colostomy reversed - his year of DAPT is now up.    Past Medical History  Diagnosis Date  . Hypertension   . Hypercholesteremia   . CHF (congestive heart failure) (HCC)     has diastolic heart failure grade 1; EF is 45 to 50% per echo 05/2011; EF 41% by Myoview 2016  . Coronary artery disease   . COPD (chronic obstructive pulmonary disease) ([place])   . Obesity   . Noncompliance   . NSVT (nonsustained ventricular tachycardia) (HCC)     beta blocker restarted  . Atrial tachycardia ([place])     managed on beta blocker therapy  . Peripheral arterial disease (HCC)     small ulcer the head of right metatarsal plantar surface right foot  . Cardiac arrest ([place])     X 2 episodes during hospital visit 12'14"electrolyte imbalance"- "Shocked"  . S/P colostomy ([place])     2014  . OSA on CPAP     used nightly  . Childhood asthma     "went away after I was 16"  . CKD (chronic kidney disease) stage 3, GFR 30-59 ml/min   . Thrombocytopenia ([place])   . Family history of ovarian cancer   . Colon cancer ([place])     MSI high; IHC loss of MLH1 and PMS2; BRAF negative; Negative methylation    Past Surgical History  Procedure Laterality Date  . Colonoscopy N/A 02/08/2013    Procedure: COLONOSCOPY;  Surgeon: [name], MD;  Location: [place];  Service: Endoscopy;  Laterality: N/A;  . Esophagogastroduodenoscopy N/A 02/08/2013    Procedure: ESOPHAGOGASTRODUODENOSCOPY (EGD);  Surgeon:  [name], MD;  Location[facility] ENDOSCOPY;  Service: Endoscopy;  Laterality: N/A;  . Laparotomy N/A 02/12/2013    Procedure: EXPLORATORY LAPAROTOMY PARTIAL COLECTOMY WITH COLOSTOMY;  Surgeon: [name], MD;  Location: [place];  Service: General;  Laterality: N/A;  . Laparotomy N/A 02/18/2013    Procedure: EXPLORATORY LAPAROTOMY/Closure of Wound;  Surgeon: [name], MD;  Location: [place];  Service: General;  Laterality: N/A;  . Coronary angioplasty with stent placement  11/12/2008; 06/11/2014    stent x 2 to RCA; stent x 2 to LAD  . Colon surgery    . Left heart catheterization with coronary angiogram N/A 06/11/2014    Procedure: LEFT HEART CATHETERIZATION WITH CORONARY ANGIOGRAM;  Surgeon: [name], MD; CFX calcified, 30-40 percent, RCA calcified, 40/50/40%, PDA diffuse disease, LAD 40/75/90% s/p DES 2   . Coronary stent placement  06/11/2014  m-LAD 3.5 x 16 mm Synergy DES, d-LAD  2.25 x 16 mm Synergy DES  . Colonoscopy with propofol N/A 05/09/2014    Procedure: COLONOSCOPY WITH PROPOFOL;  Surgeon: [name], MD;  Location: [facility] ENDOSCOPY;  Service: Endoscopy;  Laterality: N/A;     Medications: Current Outpatient Prescriptions  Medication Sig Dispense Refill  . aspirin EC 81 MG tablet Take 1 tablet (81 mg total) by mouth daily. 30 tablet 0  . atorvastatin (LIPITOR) 40 MG tablet TAKE 1 TABLET BY MOUTH DAILY 90 tablet 3  . carvedilol (COREG) 12.5 MG tablet TAKE 1 TABLET (12.5 MG TOTAL) BY MOUTH 2 (TWO) TIMES DAILY. 180 tablet 3  . clopidogrel (PLAVIX) 75 MG tablet TAKE 1 TABLET (75 MG TOTAL) BY MOUTH DAILY WITH BREAKFAST. 30 tablet 3  . ferrous sulfate 325 (65 FE) MG tablet Take 1 tablet (325 mg total) by mouth daily with breakfast. 30 tablet 1  . furosemide (LASIX) 40 MG tablet TAKE 1 TABLET (40 MG TOTAL) BY MOUTH DAILY. 90 tablet 0  . KLOR-CON M20 20 MEQ tablet TAKE 1 TABLET BY MOUTH TWICE A DAY 60 tablet 9  . Magnesium 500 MG CAPS Take 500 mg by mouth daily.    . Multiple  Vitamins-Minerals (MULTIVITAMIN WITH MINERALS) tablet Take 1 tablet by mouth daily.    . nitroGLYCERIN (NITROSTAT) 0.4 MG SL tablet Place 0.4 mg under the tongue every 5 (five) minutes as needed for chest pain.    . pantoprazole (PROTONIX) 40 MG tablet Take 1 tablet (40 mg total) by mouth daily. 30 tablet 11  . ramipril (ALTACE) 1.25 MG capsule TAKE ONE CAPSULE BY MOUTH EVERY 12 HOURS 60 capsule 11  . sildenafil (VIAGRA) 100 MG tablet Take 0.5 tablets (50 mg total) by mouth daily as needed for erectile dysfunction. 10 tablet 3  . simethicone (GAS-X) 80 MG chewable tablet Chew 1 tablet (80 mg total) by mouth 4 (four) times daily. 120 tablet 0  . spironolactone (ALDACTONE) 25 MG tablet TAKE 1 TABLET (25 MG TOTAL) BY MOUTH DAILY. 90 tablet 0  . XOPENEX HFA 45 MCG/ACT inhaler INHALE 2 PUFFS INTO THE LUNGS EVERY 4 (FOUR) HOURS AS NEEDED FOR WHEEZING. 15 Inhaler 3   No current facility-administered medications for this visit.    Allergies: No Known Allergies  Social History: The patient  reports that he quit smoking about 6 years ago. His smoking use included Cigarettes. He has a 38 pack-year smoking history. He has never used smokeless tobacco. He reports that he drinks alcohol. He reports that he does not use illicit drugs.   Family History: The patient's family history includes Dementia in his mother; Diabetes in his father; Heart disease in his father; Hypertension in his father and mother; Leukemia in his maternal uncle; Liver cancer in his maternal grandmother; Parkinsonism in his mother; Prostate cancer in his maternal uncle.   Review of Systems: Please see the history of present illness.   Otherwise, the review of systems is positive for none.   All other systems are reviewed and negative.   Physical Exam: VS:  BP 94/70 mmHg  Pulse 115  Ht 5' 11"  (1.803 m)  Wt 369 lb (167.377 kg)  BMI 51.49 kg/m2 .  BMI Body mass index is 51.49 kg/(m^2).  Wt Readings from Last 3 Encounters:    06/29/15 369 lb (167.377 kg)  05/06/15 364 lb 3.2 oz (165.2 kg)  02/03/15 363 lb (164.656 kg)    General: Alert black male who is morbidly obese  but in no acute distress. His weight continues to climb.  HEENT: Normal. Neck: Supple, no JVD, carotid bruits, or masses noted.  Cardiac: His heart tones are distant. His legs are quite full but actually with less edema than he normally has.  Respiratory:  Lungs are fairly clear but with decreased breath sounds.  GI: Obese. Soft and nontender. Colostomy in place.  MS: No deformity or atrophy. Gait and ROM intact. Skin: Warm and dry. Color is normal.  Neuro:  Strength and sensation are intact and no gross focal deficits noted.  Psych: Alert, appropriate and with normal affect.   LABORATORY DATA:  EKG:  EKG is ordered today. This demonstrates probable sinus tach with 1st degree AV block. PVCs noted.   Lab Results  Component Value Date   WBC 6.5 05/06/2015   HGB 13.2 05/06/2015   HCT 41.1 05/06/2015   PLT 130* 05/06/2015   GLUCOSE 85 02/03/2015   CHOL 157 09/24/2014   TRIG 110.0 09/24/2014   HDL 46.60 09/24/2014   LDLCALC 88 09/24/2014   ALT 16 09/24/2014   AST 14 09/24/2014   NA 139 02/03/2015   K 4.2 02/03/2015   CL 100 02/03/2015   CREATININE 1.63* 02/03/2015   BUN 16 02/03/2015   CO2 26 02/03/2015   TSH 0.70 12/06/2012   INR 1.0 06/09/2014   HGBA1C 6.0 11/26/2009    BNP (last 3 results) No results for input(s): BNP in the last 8760 hours.  ProBNP (last 3 results) No results for input(s): PROBNP in the last 8760 hours.   Other Studies Reviewed Today:  Procedure: Left Heart Cath, Selective Coronary Angiography, PTCA and stenting of the mid and distal LAD.  Indication: Severe exertional dyspnea, high risk Myoview scan in patient with known CAD. Myoview scan demonstrated large inferolateral scar as well as antero-lateral and anteroapical ischemia suggestive of multivessel coronary artery disease.  Procedural Details:  The right wrist was prepped, draped, and anesthetized with 1% lidocaine. Using the modified Seldinger technique, a 5/6 French Slender sheath was introduced into the right radial artery. 3 mg of verapamil was administered through the sheath, weight-based unfractionated heparin was administered intravenously. Standard Judkins catheters were used for selective coronary angiography. Catheter exchanges were performed over an exchange length guidewire.  PROCEDURAL FINDINGS Hemodynamics: AO 102/62 LV 102/6  Coronary angiography: Left mainstem: The left mainstem is patent. The vessel divides into the LAD and left circumflex. There is no significant stenosis, but there is mild irregularity.  Left anterior descending (LAD): The LAD is moderately calcified. The vessel is severely diseased. The proximal LAD is patent with 30-40% stenosis after the second septal perforator. There is diffuse calcification. The diagonal branches are small. The mid LAD has an eccentric 75% stenosis at the origin of the second diagonal branch. Beyond that area the mid and distal LAD are diffusely diseased. There is another severe stenosis in the apical LAD of 90%.  Left circumflex (LCx): The left circumflex is calcified. The vessel is patent with mild irregularity. There are no high-grade stenoses identified. There are scattered 30-40% stenoses throughout the proximal and mid circumflex as well as the first OM branch.  Right coronary artery (RCA): The RCA is dominant. The vessel is heavily calcified. The stented segments in the mid and distal RCA are patent. The proximal vessel has 30-40% stenosis. The mid vessel has 30-40% stenosis. The distal vessel is patent with 50-60% stenosis involving the origin of the PDA. The PDA is diffusely diseased.  Left ventriculography: Deferred because  of chronic kidney disease. By nuclear scan the LVEF is 41%.  PCI Note: Following the diagnostic procedure, the decision was made to  proceed with PCI of the mid and apical LAD. The right coronary artery had patent stents in the left circumflex had nonobstructive disease. The LAD is a diffusely diseased vessel and the mid and distal vessel would be a poor surgical targets. I felt that PCI was the best option in this patient with a high risk nuclear scan demonstrating anteroapical and anterolateral ischemia. He will require colostomy reversal, so I planned on treating him with Synergy drug-eluting stents which had a biodegradable polymer and potentially can allow for earlier interruption of dual antiplatelet therapy. The patient was loaded with Plavix 600 mg. Weight-based bivalirudin was given for anticoagulation. Once a therapeutic ACT was achieved, a 6 [place] XB LAD 3.5 cm guide catheter was inserted. A cougar coronary guidewire was used to cross the lesion in the apical LAD. The lesion was predilated with a 2.5 x 12 mm balloon. The same balloon was used to dilate the mid lesion. The apical lesion was then stented with a 2.25 x 16 mm Synergy DES. The stent was postdilated with a 2.5 mm noncompliant balloon. The mid lesion was then stented with a 3.5 x 16 mm Synergy DES. That stent was postdilated with a 3.75 mm noncompliant balloon. Following PCI, there was 0% residual stenosis and TIMI-3 flow. Final angiography confirmed an excellent result. The patient tolerated the procedure well. There were no immediate procedural complications. A TR band was used for radial hemostasis. The patient was transferred to the post catheterization recovery area for further monitoring.  PCI Data: Lesion 1: Vessel - LAD/Segment - distal/apical Percent Stenosis (pre) 90 TIMI-flow 3 Stent 2.25 x 16 mm Synergy DES Percent Stenosis (post) 0 TIMI-flow (post) 3  Lesion 2: Vessel - LAD/Segment - mid Percent Stenosis (pre) 75 TIMI-flow 3 Stent 3.5 x 16 mm Synergy DES Percent Stenosis (post) 0 TIMI-flow (post) 3  Estimated Blood Loss:  Minimal  Final Conclusions:  1. Two-vessel coronary artery disease with continued patency of the stented segments in the right coronary artery and severe stenoses of the mid and apical LAD 2. Mild diffuse nonobstructive left circumflex stenosis 3. Successful PCI of the LAD using 2 drug-eluting stents   Recommendations:  Dual antiplatelet therapy for at least 6 months. Would be reasonable to consider interruption of aspirin and Plavix at 6 months for colostomy reversal.  [name] MD, [facility] 06/11/2014, 10:06 AM   Myoview Impression April 2016 Exercise Capacity: Lexiscan with no exercise. BP Response: Normal blood pressure response. Clinical Symptoms: There is dyspnea. ECG Impression: No significant ST segment change suggestive of ischemia. Comparison with Prior Nuclear Study: No images to compare  Overall Impression: High risk stress nuclear study large inferior wall infarct from apex to base. Also suggestion of apical, anterior and lateral wall ischemia.  LV Ejection Fraction: 41%. LV Wall Motion: Inferiior akinesis Consistent with possible 3 VD Consider cath before major surgery   [place]   Echo Study Conclusions from January 2015  - Left ventricle: The cavity size was mildly dilated. Wall thickness was increased in a pattern of mild LVH. Systolic function was mildly to moderately reduced. The estimated ejection fraction was in the range of 40% to 45%. There was akinesis of the apical lateral, apical anterior, and apical septal segments. There was akinesis of the true apex. Prominent trabeculation at the apex but I do not think thrombus was present.  Patient had Definity study recently to look at the apex and no thrombus was seen. Doppler parameters are consistent with abnormal left ventricular relaxation (grade 1 diastolic dysfunction). - Aortic valve: Trileaflet; moderately calcified leaflets. There was no stenosis. - Mitral valve: Moderately  calcified annulus. Mildly calcified leaflets . Trivial regurgitation. - Left atrium: The atrium was moderately dilated. - Right ventricle: The cavity size was normal. Systolic function was normal. - Right atrium: The atrium was mildly dilated. - Pulmonary arteries: No complete TR doppler jet so unable to estimate PA systolic pressure. - Systemic veins: IVC was not visualized. - Pericardium, extracardiac: A trivial pericardial effusion was identified. Impressions:  - Mildly dilated LV with mild LV hypertrophy. EF 40-45% with peri-apical akinesis. I do not think that LV apical thrombus is present. Normal RV size and systolic function. Biatrial enlargement.   Assessment / Plan:  1. CAD with prior PCI to the RCA - S/P cath with PCI to the LAD back in April of 2016 following high risk Myoview - has had DES x 2 to the LAD - on aspirin/Plavix. Doing well but really needs to work on lifestyle modification - this has been a chronic issue. I have left him on his current regimen. We have talked about needing to increase his activity and trying to get him "tuned up" if he were to have his colostomy reversed.   2. Combined systolic and diastolic HF - seems to be stable. His weight is up - I think this is more from inactivity. His swelling has improved.   3. Colon cancer - followed by oncology - was hopeful to have his colostomy reversed. He tells me he is ok to wait the full 12 months and I agree with waiting a full 12 months - even then I think this will be challenging. If this were to take place, I would favor repeating his Myoview - should see improvement. Have got him a visit with Dr. Hulen Skains for discussion.   4. HTN - stable on present regimen. BP actually on the low side - he is totally asymptomatic. I have left him on his current regimen.   5. CKD - on very low dose ACE   6. OSA - now on CPAP and followed by Dr. Annamaria Boots  7. HLD - on statin - needs labs today.   8. Atrial tachycardia -  remains on beta blocker therapy - no real complaints of palpitations.   Current medicines are reviewed with the patient today.  The patient does not have concerns regarding medicines other than what has been noted above.  The following changes have been made:  See above.  Labs/ tests ordered today include:    Orders Placed This Encounter  Procedures  . Basic metabolic panel  . Hepatic function panel  . Lipid panel  . Ambulatory referral to General Surgery  . EKG 12-Lead     Disposition:   FU with me in 6 months. Have arranged visit with Dr. Hulen Skains as well.   Patient is agreeable to this plan and will call if any problems develop in the interim.   Signed: Burtis Junes, RN, ANP-C 06/29/2015 1:57 PM  [facility] [address] [place], [place]  [address] Phone: [phone] Fax: [phone]

## 2015-06-29 NOTE — Patient Instructions (Addendum)
We will be checking the following labs today - BMET, lipids and HPF   Medication Instructions:    Continue with your current medicines.     Testing/Procedures To Be Arranged:  Possible stress testing if Dr. Hulen Skains wishes to proceed with colostomy reversal - MAY 23RD AT 9:50 AM AT [place]:   See me in 6 months    Other Special Instructions:   I will send a note to Dr. Hulen Skains  Try to increase your walking     If you need a refill on your cardiac medications before your next appointment, please call your pharmacy.   Call the [place] office at [phone] if you have any questions, problems or concerns.

## 2015-06-30 LAB — BASIC METABOLIC PANEL
BUN: 21 mg/dL (ref 7–25)
CO2: 23 mmol/L (ref 20–31)
Calcium: 9.6 mg/dL (ref 8.6–10.3)
Chloride: 102 mmol/L (ref 98–110)
Creat: 1.72 mg/dL — ABNORMAL HIGH (ref 0.70–1.25)
Glucose, Bld: 100 mg/dL — ABNORMAL HIGH (ref 65–99)
Potassium: 4.1 mmol/L (ref 3.5–5.3)
Sodium: 137 mmol/L (ref 135–146)

## 2015-06-30 LAB — HEPATIC FUNCTION PANEL
ALT: 17 U/L (ref 9–46)
AST: 14 U/L (ref 10–35)
Albumin: 4.1 g/dL (ref 3.6–5.1)
Alkaline Phosphatase: 104 U/L (ref 40–115)
Bilirubin, Direct: 0.2 mg/dL (ref <=0.2)
Indirect Bilirubin: 0.7 mg/dL (ref 0.2–1.2)
Total Bilirubin: 0.9 mg/dL (ref 0.2–1.2)
Total Protein: 7.2 g/dL (ref 6.1–8.1)

## 2015-06-30 LAB — LIPID PANEL
Cholesterol: 168 mg/dL (ref 125–200)
HDL: 47 mg/dL (ref >=40)
LDL Cholesterol: 93 mg/dL (ref <130)
Total CHOL/HDL Ratio: 3.6 Ratio (ref <=5.0)
Triglycerides: 140 mg/dL (ref <150)
VLDL: 28 mg/dL (ref <30)

## 2015-07-14 DIAGNOSIS — Z933 Colostomy status: Secondary | ICD-10-CM | POA: Diagnosis not present

## 2015-07-22 ENCOUNTER — Encounter: Payer: Self-pay | Admitting: Family

## 2015-07-23 ENCOUNTER — Other Ambulatory Visit: Payer: Self-pay | Admitting: Nurse Practitioner

## 2015-07-24 ENCOUNTER — Ambulatory Visit (INDEPENDENT_AMBULATORY_CARE_PROVIDER_SITE_OTHER): Payer: Medicare Other | Admitting: Family

## 2015-07-24 ENCOUNTER — Other Ambulatory Visit: Payer: Self-pay

## 2015-07-24 ENCOUNTER — Ambulatory Visit (HOSPITAL_COMMUNITY)
Admission: RE | Admit: 2015-07-24 | Discharge: 2015-07-24 | Disposition: A | Discharge status: Home / Self Care | Payer: Medicare Other | Source: Ambulatory Visit | Attending: Family | Admitting: Family

## 2015-07-24 ENCOUNTER — Encounter: Payer: Self-pay | Admitting: Family

## 2015-07-24 ENCOUNTER — Other Ambulatory Visit: Payer: Self-pay | Admitting: Cardiovascular Disease

## 2015-07-24 VITALS — BP 112/73 | HR 120 | Temp 98.9°F | Resp 16 | Ht 71.0 in | Wt 362.0 lb

## 2015-07-24 DIAGNOSIS — I872 Venous insufficiency (chronic) (peripheral): Secondary | ICD-10-CM | POA: Diagnosis not present

## 2015-07-24 DIAGNOSIS — E7800 Pure hypercholesterolemia, unspecified: Secondary | ICD-10-CM | POA: Diagnosis not present

## 2015-07-24 DIAGNOSIS — I5030 Unspecified diastolic (congestive) heart failure: Secondary | ICD-10-CM | POA: Diagnosis not present

## 2015-07-24 DIAGNOSIS — I739 Peripheral vascular disease, unspecified: Secondary | ICD-10-CM

## 2015-07-24 DIAGNOSIS — G4733 Obstructive sleep apnea (adult) (pediatric): Secondary | ICD-10-CM | POA: Insufficient documentation

## 2015-07-24 DIAGNOSIS — I130 Hypertensive heart and chronic kidney disease with heart failure and stage 1 through stage 4 chronic kidney disease, or unspecified chronic kidney disease: Secondary | ICD-10-CM | POA: Insufficient documentation

## 2015-07-24 DIAGNOSIS — I83023 Varicose veins of left lower extremity with ulcer of ankle: Secondary | ICD-10-CM

## 2015-07-24 DIAGNOSIS — I2510 Atherosclerotic heart disease of native coronary artery without angina pectoris: Secondary | ICD-10-CM | POA: Insufficient documentation

## 2015-07-24 DIAGNOSIS — R0989 Other specified symptoms and signs involving the circulatory and respiratory systems: Secondary | ICD-10-CM | POA: Diagnosis present

## 2015-07-24 DIAGNOSIS — N183 Chronic kidney disease, stage 3 (moderate): Secondary | ICD-10-CM | POA: Insufficient documentation

## 2015-07-24 DIAGNOSIS — I259 Chronic ischemic heart disease, unspecified: Secondary | ICD-10-CM | POA: Diagnosis not present

## 2015-07-24 DIAGNOSIS — L97329 Non-pressure chronic ulcer of left ankle with unspecified severity: Secondary | ICD-10-CM

## 2015-07-24 MED ORDER — FUROSEMIDE 40 MG PO TABS: ORAL_TABLET | ORAL | Status: DC

## 2015-07-24 NOTE — Progress Notes (Signed)
[facility] [place]   CC: Follow up peripheral artery occlusive disease  History of Present Illness Larry [name] is a 65 y.o. male patient of Dr. Bridgett Larsson who presents with chief complaint: follow up PAD and and history of chronic venous stasis ulcer on both lower legs which have healed. The wounds were located on the lateral aspect of both lower legs, above the lateral malleoli.  He denies any other non-healing wounds. He denies any pain in his legs or feet.  He has been elevating his legs most of the time when he is not walking and he notes that the swelling in his lower legs has decreased a great deal. He states his compression hose are too tight.  He was being treated at the wound care center at [facility]. The patient reports onset of leg swelling following his heart stents in 2010. The patient is not diabetic. He is able to ambulate without assistance but does not ambulate much by choice. He lives at home with his wife. He denies any history of claudication (he does not ambulate enough to elicit claudication however) or rest pain.   He has a complex past medical history including ischemic cardiomyopathy with severe three vessel disease s/p stents to the RCA in 2010 by Dr. Angelena Form, combined systolic and diastolic HF, colon cancer s/p partial colectomy with colostomy, atrial tachycardia, history of cardiac arrest, obstructive sleep apnea, now on CPAP, CKD, hypertension and hyperlipidemia.  He is a former smoker.   His bilateral lower legs ulcers healed with 6 weeks of weekly Hydrogel dressing changes at the wound care center near [facility]. He wears compression hose sometimes, thinks the hose are too tight though.  He denies any new medical or surgical issues.   Pt Diabetic: No Pt smoker: former smoker, quit in 2010, started at age 5 years  Pt meds include: Statin :Yes Betablocker: Yes ASA: Yes Other anticoagulants/antiplatelets: no     Past Medical History  Diagnosis Date  . Hypertension   . Hypercholesteremia   . CHF (congestive heart failure) (HCC)     has diastolic heart failure grade 1; EF is 45 to 50% per echo 05/2011; EF 41% by Myoview 2016  . Coronary artery disease   . COPD (chronic obstructive pulmonary disease) ([place])   . Obesity   . Noncompliance   . NSVT (nonsustained ventricular tachycardia) (HCC)     beta blocker restarted  . Atrial tachycardia ([place])     managed on beta blocker therapy  . Peripheral arterial disease (HCC)     small ulcer the head of right metatarsal plantar surface right foot  . Cardiac arrest ([place])     X 2 episodes during hospital visit 12'14"electrolyte imbalance"- "Shocked"  . S/P colostomy ([place])     2014  . OSA on CPAP     used nightly  . Childhood asthma     "went away after I was 16"  . CKD (chronic kidney disease) stage 3, GFR 30-59 ml/min   . Thrombocytopenia ([place])   . Family history of ovarian cancer   . Colon cancer ([place])     MSI high; IHC loss of MLH1 and PMS2; BRAF negative; Negative methylation    Social History Social History  Substance Use Topics  . Smoking status: Former Smoker -- 1.00 packs/day for 38 years    Types: Cigarettes    Quit date: 07/15/2008  . Smokeless tobacco: Never Used  . Alcohol Use: 0.0 oz/week  0 Standard drinks or equivalent per week     Comment: 06/11/2014 "I rarely have a drink; last drink was New Years; if I wanted a drink I would"    Family History Family History  Problem Relation Age of Onset  . Hypertension Father   . Heart disease Father     before age 74  . Diabetes Father   . Hypertension Mother   . Dementia Mother   . Parkinsonism Mother   . Prostate cancer Maternal Uncle   . Liver cancer Maternal Grandmother     dx in her 81s  . Leukemia Maternal Uncle     Past Surgical History  Procedure Laterality Date  . Colonoscopy N/A 02/08/2013    Procedure: COLONOSCOPY;  Surgeon: [name], MD;  Location: [facility];  Service: Endoscopy;  Laterality: N/A;  . Esophagogastroduodenoscopy N/A 02/08/2013    Procedure: ESOPHAGOGASTRODUODENOSCOPY (EGD);  Surgeon: [name], MD;  Location: [facility] ENDOSCOPY;  Service: Endoscopy;  Laterality: N/A;  . Laparotomy N/A 02/12/2013    Procedure: EXPLORATORY LAPAROTOMY PARTIAL COLECTOMY WITH COLOSTOMY;  Surgeon: [name], MD;  Location: [facility];  Service: General;  Laterality: N/A;  . Laparotomy N/A 02/18/2013    Procedure: EXPLORATORY LAPAROTOMY/Closure of Wound;  Surgeon: [name], MD;  Location: [place] [facility];  Service: General;  Laterality: N/A;  . Coronary angioplasty with stent placement  11/12/2008; 06/11/2014    stent x 2 to RCA; stent x 2 to LAD  . Colon surgery    . Left heart catheterization with coronary angiogram N/A 06/11/2014    Procedure: LEFT HEART CATHETERIZATION WITH CORONARY ANGIOGRAM;  Surgeon: [name], MD; CFX calcified, 30-40 percent, RCA calcified, 40/50/40%, PDA diffuse disease, LAD 40/75/90% s/p DES 2   . Coronary stent placement  06/11/2014    m-LAD 3.5 x 16 mm Synergy DES, d-LAD  2.25 x 16 mm Synergy DES  . Colonoscopy with propofol N/A 05/09/2014    Procedure: COLONOSCOPY WITH PROPOFOL;  Surgeon: [name], MD;  Location: [facility] ENDOSCOPY;  Service: Endoscopy;  Laterality: N/A;    No Known Allergies  Current Outpatient Prescriptions  Medication Sig Dispense Refill  . aspirin EC 81 MG tablet Take 1 tablet (81 mg total) by mouth daily. 30 tablet 0  . atorvastatin (LIPITOR) 40 MG tablet TAKE 1 TABLET BY MOUTH DAILY 90 tablet 3  . carvedilol (COREG) 12.5 MG tablet TAKE 1 TABLET (12.5 MG TOTAL) BY MOUTH 2 (TWO) TIMES DAILY. 180 tablet 3  . clopidogrel (PLAVIX) 75 MG tablet TAKE 1 TABLET (75 MG TOTAL) BY MOUTH DAILY WITH BREAKFAST. 30 tablet 3  . ferrous sulfate 325 (65 FE) MG tablet Take 1 tablet (325 mg total) by mouth daily with breakfast. 30 tablet 1  . furosemide (LASIX) 40 MG tablet TAKE 1 TABLET (40 MG TOTAL) BY MOUTH  DAILY. 90 tablet 1  . KLOR-CON M20 20 MEQ tablet TAKE 1 TABLET BY MOUTH TWICE A DAY 60 tablet 9  . Magnesium 500 MG CAPS Take 500 mg by mouth daily.    . Multiple Vitamins-Minerals (MULTIVITAMIN WITH MINERALS) tablet Take 1 tablet by mouth daily.    . nitroGLYCERIN (NITROSTAT) 0.4 MG SL tablet Place 0.4 mg under the tongue every 5 (five) minutes as needed for chest pain.    . pantoprazole (PROTONIX) 40 MG tablet Take 1 tablet (40 mg total) by mouth daily. 30 tablet 11  . pantoprazole (PROTONIX) 40 MG tablet TAKE 1 TABLET (40 MG TOTAL) BY MOUTH AT BEDTIME. [place]  tablet 3  . ramipril (ALTACE) 1.25 MG capsule TAKE ONE CAPSULE BY MOUTH EVERY 12 HOURS 60 capsule 11  . sildenafil (VIAGRA) 100 MG tablet Take 0.5 tablets (50 mg total) by mouth daily as needed for erectile dysfunction. 10 tablet 3  . simethicone (GAS-X) 80 MG chewable tablet Chew 1 tablet (80 mg total) by mouth 4 (four) times daily. 120 tablet 0  . spironolactone (ALDACTONE) 25 MG tablet TAKE 1 TABLET (25 MG TOTAL) BY MOUTH DAILY. 90 tablet 0  . XOPENEX HFA 45 MCG/ACT inhaler INHALE 2 PUFFS INTO THE LUNGS EVERY 4 (FOUR) HOURS AS NEEDED FOR WHEEZING. 15 Inhaler 3   No current facility-administered medications for this visit.    ROS: See HPI for pertinent positives and negatives.   Physical Examination  Filed Vitals:   07/24/15 1209  BP: 112/73  Pulse: 120  Temp: 98.9 F (37.2 C)  Resp: 16  Height: _0  (1.803 m)  Weight: 362 lb (164.202 kg)  SpO2: 92%   Body mass index is 50.51 kg/(m^2).  General: A&O x 3, WD morbidly obese male in NAD  Head: [place]/AT  Pulmonary: Distant breath sounds due to habitus, respirations are non labored, no rales, rhonchi, or wheezing detected.  Cardiac: Distant heart sounds due to habitus, RRR, no detected murmur, no carotid bruits.   Vascular: Pulse exam difficult due to habitus.  Vessel Right Left  Radial Palpable Palpable  Aorta Not palpable N/A  Femoral Not  palpable Not palpable  Popliteal Not palpable Not palpable  PT Not palpable Not palpable  DP Not palpable Not palpable   Gastrointestinal: soft, non tender, obese, colostomy appliance to right abdomen.   Musculoskeletal: M/S 5/5 throughout. Less edema to lower legs bilaterally compared to previous visit. Extremities without ischemic changes. Venous stasis ulcers of both lower legs have healed. No cellulitis, no drainage, no odor. Venous stasis changes to lower legs bilaterally. No wounds on feet. Soles of feet have thick scaly calluses. Thick toenails.  Neurologic: CN 2-12 intact. Pain and light touch intact in extremities. Motor exam as listed above  Psychiatric: Judgment intact, Mood & affect appropriate for pt's clinical situation  Dermatologic: See M/S exam for extremity exam, no rashes otherwise noted                Non-Invasive Vascular Imaging: DATE: 07/24/2015 ABI: RIGHT: >255 (not reliable,01/23/15),  Waveforms: monophasic;  LEFT: >255, Waveforms: monophasic Unable to obtain toe indices due to current state of patient's feet and digits.    ASSESSMENT: Larry [name] is a 65 y.o. male has bilateral chronic venous stasis; the venous ulcers on his lower legs have healed with treatment at the wound care center at [facility]. He has been adequately elevating his legs to minimize edema, and the edema in his lower legs is significantly less than previous visit. He has no non healing wounds nor signs of ischemia in his feet/legs.  He does have venous stasis changes in the skin (leathery) of his lower legs and feet. ABI's indicate vessel calcification, all monophasic waveforms; he does not seem to have DM, but does have some degree of CRF which could be the etiology of vessel calcification. He has multiple medical problems. He does not seem to walk enough to elicit claudication. I instructed him in a graduated walking program.   His atherosclerotic risk factors  include CAD, CKD, former smoker, extreme morbid obesity, and sedentary lifestyle.   PLAN:  Graduated walking program.  Continue elevation of feet above his  heart as much as possible when he is not walking. Based on the patient's vascular studies and examination, pt will return to clinic in 1 year with ABI's. I advised him to notify us if he develops concerns re the circulation in his legs/feet.  I discussed in depth with the patient the nature of atherosclerosis, and emphasized the importance of maximal medical management including strict control of blood pressure, blood glucose, and lipid levels, obtaining regular exercise, and continued cessation of smoking.  The patient is aware that without maximal medical management the underlying atherosclerotic disease process will progress, limiting the benefit of any interventions.  The patient was given information about PAD including signs, symptoms, treatment, what symptoms should prompt the patient to seek immediate medical care, and risk reduction measures to take.  Clemon Chambers, RN, MSN, FNP-C [facility] Phone: [phone]  Clinic MD: Bridgett Larsson  07/24/2015 12:11 PM

## 2015-07-24 NOTE — Patient Instructions (Signed)
Peripheral Vascular Disease Peripheral vascular disease (PVD) is a disease of the blood vessels that are not part of your heart and brain. A simple term for PVD is poor circulation. In most cases, PVD narrows the blood vessels that carry blood from your heart to the rest of your body. This can result in a decreased supply of blood to your arms, legs, and internal organs, like your stomach or kidneys. However, it most often affects a person's lower legs and feet. There are two types of PVD.  Organic PVD. This is the more common type. It is caused by damage to the structure of blood vessels.  Functional PVD. This is caused by conditions that make blood vessels contract and tighten (spasm). Without treatment, PVD tends to get worse over time. PVD can also lead to acute ischemic limb. This is when an arm or limb suddenly has trouble getting enough blood. This is a medical emergency. CAUSES Each type of PVD has many different causes. The most common cause of PVD is buildup of a fatty material (plaque) inside of your arteries (atherosclerosis). Small amounts of plaque can break off from the walls of the blood vessels and become lodged in a smaller artery. This blocks blood flow and can cause acute ischemic limb. Other common causes of PVD include:  Blood clots that form inside of blood vessels.  Injuries to blood vessels.  Diseases that cause inflammation of blood vessels or cause blood vessel spasms.  Health behaviors and health history that increase your risk of developing PVD. RISK FACTORS  You may have a greater risk of PVD if you:  Have a family history of PVD.  Have certain medical conditions, including:  High cholesterol.  Diabetes.  High blood pressure (hypertension).  Coronary heart disease.  Past problems with blood clots.  Past injury, such as burns or a broken bone. These may have damaged blood vessels in your limbs.  Buerger disease. This is caused by inflamed blood  vessels in your hands and feet.  Some forms of arthritis.  Rare birth defects that affect the arteries in your legs.  Use tobacco.  Do not get enough exercise.  Are obese.  Are age 50 or older. SIGNS AND SYMPTOMS  PVD may cause many different symptoms. Your symptoms depend on what part of your body is not getting enough blood. Some common signs and symptoms include:  Cramps in your lower legs. This may be a symptom of poor leg circulation (claudication).  Pain and weakness in your legs while you are physically active that goes away when you rest (intermittent claudication).  Leg pain when at rest.  Leg numbness, tingling, or weakness.  Coldness in a leg or foot, especially when compared with the other leg.  Skin or hair changes. These can include:  Hair loss.  Shiny skin.  Pale or bluish skin.  Thick toenails.  Inability to get or maintain an erection (erectile dysfunction). People with PVD are more prone to developing ulcers and sores on their toes, feet, or legs. These may take longer than normal to heal. DIAGNOSIS Your health care provider may diagnose PVD from your signs and symptoms. The health care provider will also do a physical exam. You may have tests to find out what is causing your PVD and determine its severity. Tests may include:  Blood pressure recordings from your arms and legs and measurements of the strength of your pulses (pulse volume recordings).  Imaging studies using sound waves to take pictures of   the blood flow through your blood vessels (Doppler ultrasound).  Injecting a dye into your blood vessels before having imaging studies using:  X-rays (angiogram or arteriogram).  Computer-generated X-rays (CT angiogram).  A powerful electromagnetic field and a computer (magnetic resonance angiogram or MRA). TREATMENT Treatment for PVD depends on the cause of your condition and the severity of your symptoms. It also depends on your age. Underlying  causes need to be treated and controlled. These include long-lasting (chronic) conditions, such as diabetes, high cholesterol, and high blood pressure. You may need to first try making lifestyle changes and taking medicines. Surgery may be needed if these do not work. Lifestyle changes may include:  Quitting smoking.  Exercising regularly.  Following a low-fat, low-cholesterol diet. Medicines may include:  Blood thinners to prevent blood clots.  Medicines to improve blood flow.  Medicines to improve your blood cholesterol levels. Surgical procedures may include:  A procedure that uses an inflated balloon to open a blocked artery and improve blood flow (angioplasty).  A procedure to put in a tube (stent) to keep a blocked artery open (stent implant).  Surgery to reroute blood flow around a blocked artery (peripheral bypass surgery).  Surgery to remove dead tissue from an infected wound on the affected limb.  Amputation. This is surgical removal of the affected limb. This may be necessary in cases of acute ischemic limb that are not improved through medical or surgical treatments. HOME CARE INSTRUCTIONS  Take medicines only as directed by your health care provider.  Do not use any tobacco products, including cigarettes, chewing tobacco, or electronic cigarettes. If you need help quitting, ask your health care provider.  Lose weight if you are overweight, and maintain a healthy weight as directed by your health care provider.  Eat a diet that is low in fat and cholesterol. If you need help, ask your health care provider.  Exercise regularly. Ask your health care provider to suggest some good activities for you.  Use compression stockings or other mechanical devices as directed by your health care provider.  Take good care of your feet.  Wear comfortable shoes that fit well.  Check your feet often for any cuts or sores. SEEK MEDICAL CARE IF:  You have cramps in your legs  while walking.  You have leg pain when you are at rest.  You have coldness in a leg or foot.  Your skin changes.  You have erectile dysfunction.  You have cuts or sores on your feet that are not healing. SEEK IMMEDIATE MEDICAL CARE IF:  Your arm or leg turns cold and blue.  Your arms or legs become red, warm, swollen, painful, or numb.  You have chest pain or trouble breathing.  You suddenly have weakness in your face, arm, or leg.  You become very confused or lose the ability to speak.  You suddenly have a very bad headache or lose your vision.   This information is not intended to replace advice given to you by your health care provider. Make sure you discuss any questions you have with your health care provider.   Document Released: 03/17/2004 Document Revised: 02/28/2014 Document Reviewed: 07/18/2013 Elsevier Interactive Patient Education 2016 [employer] Inc[employer]  

## 2015-08-10 DIAGNOSIS — I2510 Atherosclerotic heart disease of native coronary artery without angina pectoris: Secondary | ICD-10-CM | POA: Diagnosis not present

## 2015-08-10 DIAGNOSIS — Z85038 Personal history of other malignant neoplasm of large intestine: Secondary | ICD-10-CM | POA: Diagnosis not present

## 2015-08-10 DIAGNOSIS — G4733 Obstructive sleep apnea (adult) (pediatric): Secondary | ICD-10-CM | POA: Diagnosis not present

## 2015-08-23 ENCOUNTER — Other Ambulatory Visit: Payer: Self-pay | Admitting: Cardiovascular Disease

## 2015-09-05 ENCOUNTER — Other Ambulatory Visit: Payer: Self-pay | Admitting: Physician Assistant

## 2015-11-05 ENCOUNTER — Other Ambulatory Visit: Payer: Self-pay | Admitting: Interventional Cardiology

## 2015-11-06 ENCOUNTER — Other Ambulatory Visit: Payer: Self-pay

## 2015-11-06 MED ORDER — POTASSIUM CHLORIDE CRYS ER 20 MEQ PO TBCR: 20.0000 meq | EXTENDED_RELEASE_TABLET | Freq: Two times a day (BID) | ORAL | 3 refills | Status: DC

## 2015-11-12 ENCOUNTER — Other Ambulatory Visit (HOSPITAL_BASED_OUTPATIENT_CLINIC_OR_DEPARTMENT_OTHER): Payer: Medicare Other

## 2015-11-12 ENCOUNTER — Encounter: Payer: Self-pay | Admitting: *Deleted

## 2015-11-12 ENCOUNTER — Telehealth: Payer: Self-pay | Admitting: Oncology

## 2015-11-12 ENCOUNTER — Ambulatory Visit (HOSPITAL_BASED_OUTPATIENT_CLINIC_OR_DEPARTMENT_OTHER): Payer: Medicare Other | Admitting: Oncology

## 2015-11-12 ENCOUNTER — Telehealth: Payer: Self-pay | Admitting: Nurse Practitioner

## 2015-11-12 ENCOUNTER — Other Ambulatory Visit: Payer: Self-pay | Admitting: *Deleted

## 2015-11-12 VITALS — BP 119/67 | HR 122 | Temp 98.6°F | Resp 17 | Ht 71.0 in | Wt 358.3 lb

## 2015-11-12 DIAGNOSIS — Z85038 Personal history of other malignant neoplasm of large intestine: Secondary | ICD-10-CM | POA: Diagnosis not present

## 2015-11-12 DIAGNOSIS — C184 Malignant neoplasm of transverse colon: Secondary | ICD-10-CM

## 2015-11-12 LAB — CBC WITH DIFFERENTIAL/PLATELET
BASO%: 0.8 % (ref 0.0–2.0)
Basophils Absolute: 0.1 10*3/uL (ref 0.0–0.1)
EOS%: 5.9 % (ref 0.0–7.0)
Eosinophils Absolute: 0.4 10*3/uL (ref 0.0–0.5)
HEMATOCRIT: 40.9 % (ref 38.4–49.9)
HEMOGLOBIN: 13.1 g/dL (ref 13.0–17.1)
LYMPH#: 1.5 10*3/uL (ref 0.9–3.3)
LYMPH%: 22.6 % (ref 14.0–49.0)
MCH: 28.1 pg (ref 27.2–33.4)
MCHC: 31.9 g/dL — AB (ref 32.0–36.0)
MCV: 88.2 fL (ref 79.3–98.0)
MONO#: 0.8 10*3/uL (ref 0.1–0.9)
MONO%: 11.6 % (ref 0.0–14.0)
NEUT#: 3.9 10*3/uL (ref 1.5–6.5)
NEUT%: 59.1 % (ref 39.0–75.0)
PLATELETS: 125 10*3/uL — AB (ref 140–400)
RBC: 4.64 10*6/uL (ref 4.20–5.82)
RDW: 15.3 % — AB (ref 11.0–14.6)
WBC: 6.6 10*3/uL (ref 4.0–10.3)

## 2015-11-12 LAB — CEA (IN HOUSE-CHCC): CEA (CHCC-In House): 2.67 ng/mL (ref 0.00–5.00)

## 2015-11-12 MED ORDER — CARVEDILOL 25 MG PO TABS: 25.0000 mg | ORAL_TABLET | Freq: Two times a day (BID) | ORAL | Status: DC

## 2015-11-12 NOTE — Telephone Encounter (Signed)
S/w pt is agreeable to treatment plan will increase Coreg (25 mg ) bid and made appointment for September 27. Medication updated.

## 2015-11-12 NOTE — Progress Notes (Signed)
Called [phone] and left message with receptionist who stated he will inform Truitt Merle PA of patient's tachycardia from today's visit.

## 2015-11-12 NOTE — Telephone Encounter (Signed)
Let's increase the Coreg to 25 mg twice a day. Let me see him in about a week or so with EKG.

## 2015-11-12 NOTE — Telephone Encounter (Signed)
New Message  Lorriane Shire from [facility] voiced pt saw MD-Cheryl today and HR was 128, pt laid on table for awhile and HR 122, no pain or complaints prior to pt leaving.  Lorriane Shire from [facility] voiced she wanted to call and let APP/PA know.  Please f/u with pt if needed

## 2015-11-12 NOTE — Progress Notes (Signed)
  [facility] OFFICE PROGRESS NOTE   Diagnosis: Colon cancer  INTERVAL HISTORY:   Mr. Dealmeida returns as scheduled. He feels well. He plans to have the colostomy reversed in November. He used his inhaler for dyspnea yesterday. No dyspnea today. No other complaint.  Objective:  Vital signs in last 24 hours:  Blood pressure 119/67, pulse (!) 126, temperature 98.6 F (37 C), temperature source Oral, resp. rate 17, height _0  (1.803 m), weight (!) 358 lb 4.8 oz (162.5 kg), SpO2 97 %.    HEENT: Neck without mass Lymphatics: No cervical, supraclavicular, axillary, or inguinal nodes Resp: Lungs clear bilaterally Cardio: Tachycardia, regular rhythm GI: Right abdomen colostomy, no hepatosplenomegaly, no mass, nontender Vascular: Chronic stasis change at the lower leg bilaterally, no edema   Lab Results:  Lab Results  Component Value Date   WBC 6.6 11/12/2015   HGB 13.1 11/12/2015   HCT 40.9 11/12/2015   MCV 88.2 11/12/2015   PLT 125 (L) 11/12/2015   NEUTROABS 3.9 11/12/2015   CEA pending  Medications: I have reviewed the patient's current medications.  Assessment/Plan: 1. Adenocarcinoma of the transverse colon, stage IIc (T4 N0), status post a partial colectomy/colostomy 02/12/2013  Microsatellite instability-high, loss of MLH1 and PMS2 expression  No BRAF mutation  Negative for MLH1 methylation  Gene panel confirmed a variant of unknown significance in the PMS2. He was referred to the genetics counselor.  Colonoscopy on 05/09/2014-negative  2. COPD  3. Coronary artery disease  4. History of peripheral arterial disease    Disposition:  [name] remains in clinical remission from colon cancer. We will follow-up on the CEA from today. He will return for an office visit and CEA in 6 months. He will follow-up with Dr. Hulen Skains to consider reversal of the colostomy.  He is followed by cardiology for CHF, CAD, and arrhythmias. He has a  regular tachycardia today. He is asymptomatic. He reports taking his medication. We will contact cardiology for recommendations. He had persistent tachycardia on repeat determinations today.  Betsy Coder, MD  11/12/2015  12:24 PM

## 2015-11-12 NOTE — Telephone Encounter (Signed)
Avs report and appointment schedule given to patient, per 11/12/15 los. °

## 2015-11-13 ENCOUNTER — Other Ambulatory Visit: Payer: Self-pay

## 2015-11-13 ENCOUNTER — Telehealth: Payer: Self-pay | Admitting: *Deleted

## 2015-11-13 ENCOUNTER — Inpatient Hospital Stay (HOSPITAL_COMMUNITY)
Admission: EM | Admit: 2015-11-13 | Discharge: 2015-11-15 | DRG: 309 | Disposition: A | Discharge status: Home / Self Care | Payer: Medicare Other | Attending: Cardiovascular Disease | Admitting: Cardiovascular Disease

## 2015-11-13 ENCOUNTER — Emergency Department (HOSPITAL_COMMUNITY): Payer: Medicare Other

## 2015-11-13 ENCOUNTER — Encounter (HOSPITAL_COMMUNITY): Payer: Self-pay | Admitting: Emergency Medicine

## 2015-11-13 DIAGNOSIS — E785 Hyperlipidemia, unspecified: Secondary | ICD-10-CM | POA: Diagnosis present

## 2015-11-13 DIAGNOSIS — I471 Supraventricular tachycardia: Secondary | ICD-10-CM | POA: Diagnosis not present

## 2015-11-13 DIAGNOSIS — R0602 Shortness of breath: Secondary | ICD-10-CM | POA: Diagnosis not present

## 2015-11-13 DIAGNOSIS — R911 Solitary pulmonary nodule: Secondary | ICD-10-CM

## 2015-11-13 DIAGNOSIS — R002 Palpitations: Secondary | ICD-10-CM

## 2015-11-13 DIAGNOSIS — R0609 Other forms of dyspnea: Secondary | ICD-10-CM

## 2015-11-13 DIAGNOSIS — R918 Other nonspecific abnormal finding of lung field: Secondary | ICD-10-CM

## 2015-11-13 DIAGNOSIS — I130 Hypertensive heart and chronic kidney disease with heart failure and stage 1 through stage 4 chronic kidney disease, or unspecified chronic kidney disease: Secondary | ICD-10-CM | POA: Diagnosis present

## 2015-11-13 DIAGNOSIS — R0600 Dyspnea, unspecified: Secondary | ICD-10-CM | POA: Insufficient documentation

## 2015-11-13 DIAGNOSIS — R000 Tachycardia, unspecified: Secondary | ICD-10-CM | POA: Diagnosis not present

## 2015-11-13 DIAGNOSIS — Z7902 Long term (current) use of antithrombotics/antiplatelets: Secondary | ICD-10-CM

## 2015-11-13 DIAGNOSIS — I2510 Atherosclerotic heart disease of native coronary artery without angina pectoris: Secondary | ICD-10-CM | POA: Diagnosis present

## 2015-11-13 DIAGNOSIS — R9431 Abnormal electrocardiogram [ECG] [EKG]: Secondary | ICD-10-CM

## 2015-11-13 DIAGNOSIS — I4581 Long QT syndrome: Secondary | ICD-10-CM | POA: Diagnosis not present

## 2015-11-13 DIAGNOSIS — R059 Cough, unspecified: Secondary | ICD-10-CM

## 2015-11-13 DIAGNOSIS — Z8249 Family history of ischemic heart disease and other diseases of the circulatory system: Secondary | ICD-10-CM

## 2015-11-13 DIAGNOSIS — Z933 Colostomy status: Secondary | ICD-10-CM

## 2015-11-13 DIAGNOSIS — R05 Cough: Secondary | ICD-10-CM

## 2015-11-13 DIAGNOSIS — I472 Ventricular tachycardia: Secondary | ICD-10-CM | POA: Diagnosis present

## 2015-11-13 DIAGNOSIS — R0689 Other abnormalities of breathing: Secondary | ICD-10-CM | POA: Insufficient documentation

## 2015-11-13 DIAGNOSIS — Z955 Presence of coronary angioplasty implant and graft: Secondary | ICD-10-CM

## 2015-11-13 DIAGNOSIS — Z79899 Other long term (current) drug therapy: Secondary | ICD-10-CM

## 2015-11-13 DIAGNOSIS — I255 Ischemic cardiomyopathy: Secondary | ICD-10-CM | POA: Diagnosis present

## 2015-11-13 DIAGNOSIS — Z8674 Personal history of sudden cardiac arrest: Secondary | ICD-10-CM

## 2015-11-13 DIAGNOSIS — R5381 Other malaise: Secondary | ICD-10-CM | POA: Diagnosis present

## 2015-11-13 DIAGNOSIS — I4719 Other supraventricular tachycardia: Secondary | ICD-10-CM

## 2015-11-13 DIAGNOSIS — N183 Chronic kidney disease, stage 3 (moderate): Secondary | ICD-10-CM | POA: Diagnosis present

## 2015-11-13 DIAGNOSIS — G4733 Obstructive sleep apnea (adult) (pediatric): Secondary | ICD-10-CM | POA: Diagnosis present

## 2015-11-13 DIAGNOSIS — I739 Peripheral vascular disease, unspecified: Secondary | ICD-10-CM | POA: Diagnosis present

## 2015-11-13 DIAGNOSIS — I5042 Chronic combined systolic (congestive) and diastolic (congestive) heart failure: Secondary | ICD-10-CM

## 2015-11-13 DIAGNOSIS — Z6841 Body Mass Index (BMI) 40.0 and over, adult: Secondary | ICD-10-CM

## 2015-11-13 DIAGNOSIS — Z7982 Long term (current) use of aspirin: Secondary | ICD-10-CM

## 2015-11-13 DIAGNOSIS — J449 Chronic obstructive pulmonary disease, unspecified: Secondary | ICD-10-CM | POA: Diagnosis present

## 2015-11-13 DIAGNOSIS — I5032 Chronic diastolic (congestive) heart failure: Secondary | ICD-10-CM | POA: Diagnosis present

## 2015-11-13 DIAGNOSIS — Z87891 Personal history of nicotine dependence: Secondary | ICD-10-CM

## 2015-11-13 DIAGNOSIS — Z85038 Personal history of other malignant neoplasm of large intestine: Secondary | ICD-10-CM

## 2015-11-13 HISTORY — DX: Torsades de pointes: I47.21

## 2015-11-13 HISTORY — DX: Chronic combined systolic (congestive) and diastolic (congestive) heart failure: I50.42

## 2015-11-13 HISTORY — DX: Ventricular tachycardia: I47.2

## 2015-11-13 LAB — BASIC METABOLIC PANEL
ANION GAP: 8 (ref 5–15)
BUN: 19 mg/dL (ref 6–20)
CALCIUM: 9.7 mg/dL (ref 8.9–10.3)
CO2: 24 mmol/L (ref 22–32)
Chloride: 106 mmol/L (ref 101–111)
Creatinine, Ser: 1.72 mg/dL — ABNORMAL HIGH (ref 0.61–1.24)
GFR, EST AFRICAN AMERICAN: 47 mL/min — AB (ref >60)
GFR, EST NON AFRICAN AMERICAN: 41 mL/min — AB (ref >60)
GLUCOSE: 104 mg/dL — AB (ref 65–99)
POTASSIUM: 4.1 mmol/L (ref 3.5–5.1)
Sodium: 138 mmol/L (ref 135–145)

## 2015-11-13 LAB — CBC WITH DIFFERENTIAL/PLATELET
BASOS ABS: 0 10*3/uL (ref 0.0–0.1)
BASOS PCT: 0 %
Eosinophils Absolute: 0.3 10*3/uL (ref 0.0–0.7)
Eosinophils Relative: 5 %
HEMATOCRIT: 41.6 % (ref 39.0–52.0)
HEMOGLOBIN: 12.9 g/dL — AB (ref 13.0–17.0)
LYMPHS PCT: 23 %
Lymphs Abs: 1.4 10*3/uL (ref 0.7–4.0)
MCH: 28.3 pg (ref 26.0–34.0)
MCHC: 31 g/dL (ref 30.0–36.0)
MCV: 91.2 fL (ref 78.0–100.0)
MONO ABS: 0.6 10*3/uL (ref 0.1–1.0)
Monocytes Relative: 9 %
NEUTROS ABS: 3.8 10*3/uL (ref 1.7–7.7)
NEUTROS PCT: 63 %
Platelets: 140 10*3/uL — ABNORMAL LOW (ref 150–400)
RBC: 4.56 MIL/uL (ref 4.22–5.81)
RDW: 14.7 % (ref 11.5–15.5)
WBC: 6.1 10*3/uL (ref 4.0–10.5)

## 2015-11-13 LAB — TROPONIN I
Troponin I: <0.03 ng/mL (ref <0.03)
Troponin I: <0.03 ng/mL (ref <0.03)

## 2015-11-13 LAB — BRAIN NATRIURETIC PEPTIDE: B NATRIURETIC PEPTIDE 5: 9.2 pg/mL (ref 0.0–100.0)

## 2015-11-13 LAB — TSH: TSH: 0.962 u[IU]/mL (ref 0.350–4.500)

## 2015-11-13 LAB — MAGNESIUM: MAGNESIUM: 2.2 mg/dL (ref 1.7–2.4)

## 2015-11-13 MED ORDER — POTASSIUM CHLORIDE CRYS ER 20 MEQ PO TBCR
20.0000 meq | EXTENDED_RELEASE_TABLET | Freq: Two times a day (BID) | ORAL | Status: DC
  Administered 2015-11-13 – 2015-11-15 (×5): 20 meq via ORAL
  Filled 2015-11-13 (×5): qty 1

## 2015-11-13 MED ORDER — VERAPAMIL HCL 80 MG PO TABS
80.0000 mg | ORAL_TABLET | Freq: Three times a day (TID) | ORAL | Status: DC
  Administered 2015-11-13 – 2015-11-15 (×5): 80 mg via ORAL
  Filled 2015-11-13 (×6): qty 1

## 2015-11-13 MED ORDER — ATORVASTATIN CALCIUM 40 MG PO TABS
40.0000 mg | ORAL_TABLET | Freq: Every day | ORAL | Status: DC
  Administered 2015-11-13 – 2015-11-14 (×2): 40 mg via ORAL
  Filled 2015-11-13 (×3): qty 1

## 2015-11-13 MED ORDER — ASPIRIN EC 81 MG PO TBEC
81.0000 mg | DELAYED_RELEASE_TABLET | Freq: Every day | ORAL | Status: DC
  Administered 2015-11-14 – 2015-11-15 (×2): 81 mg via ORAL
  Filled 2015-11-13 (×3): qty 1

## 2015-11-13 MED ORDER — SODIUM CHLORIDE 0.9 % IV SOLN: 250.0000 mL | INTRAVENOUS | Status: DC | PRN

## 2015-11-13 MED ORDER — ZOLPIDEM TARTRATE 5 MG PO TABS: 5.0000 mg | ORAL_TABLET | Freq: Every evening | ORAL | Status: DC | PRN

## 2015-11-13 MED ORDER — METOPROLOL TARTRATE 25 MG PO TABS
25.0000 mg | ORAL_TABLET | Freq: Two times a day (BID) | ORAL | Status: DC
  Administered 2015-11-13 – 2015-11-15 (×4): 25 mg via ORAL
  Filled 2015-11-13 (×4): qty 1

## 2015-11-13 MED ORDER — FUROSEMIDE 20 MG PO TABS
20.0000 mg | ORAL_TABLET | Freq: Every day | ORAL | Status: DC
  Administered 2015-11-13 – 2015-11-15 (×3): 20 mg via ORAL
  Filled 2015-11-13 (×3): qty 1

## 2015-11-13 MED ORDER — SIMETHICONE 80 MG PO CHEW
80.0000 mg | CHEWABLE_TABLET | Freq: Four times a day (QID) | ORAL | Status: DC
  Administered 2015-11-13 – 2015-11-15 (×8): 80 mg via ORAL
  Filled 2015-11-13 (×8): qty 1

## 2015-11-13 MED ORDER — FERROUS SULFATE 325 (65 FE) MG PO TABS
325.0000 mg | ORAL_TABLET | Freq: Every day | ORAL | Status: DC
  Administered 2015-11-14 – 2015-11-15 (×2): 325 mg via ORAL
  Filled 2015-11-13 (×2): qty 1

## 2015-11-13 MED ORDER — VERAPAMIL HCL 40 MG PO TABS: 40.0000 mg | ORAL_TABLET | Freq: Three times a day (TID) | ORAL | Status: DC

## 2015-11-13 MED ORDER — CARVEDILOL 12.5 MG PO TABS
25.0000 mg | ORAL_TABLET | Freq: Once | ORAL | Status: AC
  Administered 2015-11-13: 25 mg via ORAL
  Filled 2015-11-13: qty 2

## 2015-11-13 MED ORDER — PANTOPRAZOLE SODIUM 40 MG PO TBEC
40.0000 mg | DELAYED_RELEASE_TABLET | Freq: Every day | ORAL | Status: DC
  Administered 2015-11-13 – 2015-11-15 (×3): 40 mg via ORAL
  Filled 2015-11-13 (×3): qty 1

## 2015-11-13 MED ORDER — RAMIPRIL 1.25 MG PO CAPS
1.2500 mg | ORAL_CAPSULE | Freq: Every day | ORAL | Status: DC
  Administered 2015-11-14 – 2015-11-15 (×2): 1.25 mg via ORAL
  Filled 2015-11-13 (×2): qty 1

## 2015-11-13 MED ORDER — ALPRAZOLAM 0.25 MG PO TABS
0.2500 mg | ORAL_TABLET | Freq: Two times a day (BID) | ORAL | Status: DC | PRN
Start: 2015-11-13 — End: 2015-11-15

## 2015-11-13 MED ORDER — ACETAMINOPHEN 325 MG PO TABS: 650.0000 mg | ORAL_TABLET | ORAL | Status: DC | PRN

## 2015-11-13 MED ORDER — LEVALBUTEROL TARTRATE 45 MCG/ACT IN AERO: 1.0000 | INHALATION_SPRAY | Freq: Four times a day (QID) | RESPIRATORY_TRACT | Status: DC | PRN

## 2015-11-13 MED ORDER — CLOPIDOGREL BISULFATE 75 MG PO TABS
75.0000 mg | ORAL_TABLET | Freq: Every day | ORAL | Status: DC
  Administered 2015-11-13 – 2015-11-15 (×3): 75 mg via ORAL
  Filled 2015-11-13 (×3): qty 1

## 2015-11-13 MED ORDER — NITROGLYCERIN 0.4 MG SL SUBL
0.4000 mg | SUBLINGUAL_TABLET | SUBLINGUAL | Status: DC | PRN
Start: 2015-11-13 — End: 2015-11-15

## 2015-11-13 MED ORDER — ADULT MULTIVITAMIN W/MINERALS CH
1.0000 | ORAL_TABLET | Freq: Every day | ORAL | Status: DC
  Administered 2015-11-14 – 2015-11-15 (×2): 1 via ORAL
  Filled 2015-11-13 (×3): qty 1

## 2015-11-13 MED ORDER — SODIUM CHLORIDE 0.9% FLUSH
3.0000 mL | Freq: Two times a day (BID) | INTRAVENOUS | Status: DC
  Administered 2015-11-13 – 2015-11-15 (×4): 3 mL via INTRAVENOUS

## 2015-11-13 MED ORDER — ENOXAPARIN SODIUM 40 MG/0.4ML ~~LOC~~ SOLN
40.0000 mg | SUBCUTANEOUS | Status: DC
  Administered 2015-11-13 – 2015-11-14 (×2): 40 mg via SUBCUTANEOUS
  Filled 2015-11-13 (×2): qty 0.4

## 2015-11-13 MED ORDER — CARVEDILOL 12.5 MG PO TABS: 25.0000 mg | ORAL_TABLET | Freq: Two times a day (BID) | ORAL | Status: DC

## 2015-11-13 MED ORDER — SPIRONOLACTONE 25 MG PO TABS
12.5000 mg | ORAL_TABLET | Freq: Two times a day (BID) | ORAL | Status: DC
  Administered 2015-11-13 – 2015-11-15 (×5): 12.5 mg via ORAL
  Filled 2015-11-13 (×5): qty 1

## 2015-11-13 MED ORDER — SODIUM CHLORIDE 0.9% FLUSH: 3.0000 mL | INTRAVENOUS | Status: DC | PRN

## 2015-11-13 MED ORDER — MAGNESIUM OXIDE 400 (241.3 MG) MG PO TABS
400.0000 mg | ORAL_TABLET | Freq: Every day | ORAL | Status: DC
  Administered 2015-11-13 – 2015-11-15 (×3): 400 mg via ORAL
  Filled 2015-11-13 (×3): qty 1

## 2015-11-13 MED ORDER — LEVALBUTEROL HCL 0.63 MG/3ML IN NEBU
0.6300 mg | INHALATION_SOLUTION | Freq: Four times a day (QID) | RESPIRATORY_TRACT | Status: DC | PRN
  Administered 2015-11-14: 0.63 mg via RESPIRATORY_TRACT
  Filled 2015-11-13: qty 3

## 2015-11-13 NOTE — H&P (Addendum)
History and Physical   Patient ID: Larry [name] MRN: [id number], DOB/AGE: 1950-10-04 65 y.o. Date of Encounter: 11/13/2015  Primary Physician: Kristine Garbe, MD Primary Cardiologist: Dr Rayann Heman  Chief Complaint:  Tachycardia and SOB  HPI: Larry [name] is a 65 y.o. male with a history of ICM, 3v dz, RCA stent, LAD stent x 2 2016, S-D-CHF, HTN, HLD, obesity, atrial tach. Echo 2015 w/ EF 40-45%, grade 1 dd, colon CA w/ colostomy, low plt. PAD w/ monophasic ABIs 07/2015 and probs w/ LE wounds.  He has been coughing more than usual, get phlegm in the back of his throat but does not get it all the way out. No fevers. No sick contacts. Respiratory status poor at baseline, pt gets SOB walking room-room. He uses CPAP qhs consistently.   Chronic orthopnea, no recent change. No LE edema, no PND on CPAP. His LE wounds have finally healed.   He never gets chest pain. He never gets palpitations. He has no awareness of a rapid HR. He never gets dizzy, presyncope or syncope.  He was at the [facility] yesterday and they noted an increased HR. A cardiology appointment was made for next week.  Today, his wife was tired of hearing him cough and brought him to the ER. In the ER, he is going in and out of atrial tachycardia with a HR in the 130s at times. He is completely unaware of this.    Past Medical History:  Diagnosis Date  . Atrial tachycardia ([place])    managed on beta blocker therapy  . Cardiac arrest ([place])    X 2 episodes during hospital visit 12'14"electrolyte imbalance"- "Shocked"  . Childhood asthma    "went away after I was 16"  . Chronic combined systolic and diastolic CHF, NYHA class 3 (HCC)    has diastolic heart failure grade 1; EF is 45 to 50% per echo 05/2011; EF 41% by Myoview 2016  . CKD (chronic kidney disease) stage 3, GFR 30-59 ml/min   . Colon cancer ([place])    MSI high; IHC loss of MLH1 and PMS2; BRAF negative; Negative methylation  . COPD (chronic  obstructive pulmonary disease) ([place])   . Coronary artery disease   . Family history of ovarian cancer   . Hypercholesteremia   . Hypertension   . Noncompliance   . NSVT (nonsustained ventricular tachycardia) (HCC)    beta blocker restarted  . Obesity   . OSA on CPAP    used nightly  . Peripheral arterial disease (HCC)    small ulcer the head of right metatarsal plantar surface right foot  . S/P colostomy ([place])    2014  . Thrombocytopenia [facility])     Surgical History:  Past Surgical History:  Procedure Laterality Date  . COLON SURGERY    . COLONOSCOPY N/A 02/08/2013   Procedure: COLONOSCOPY;  Surgeon: [name], MD;  Location: [place];  Service: Endoscopy;  Laterality: N/A;  . COLONOSCOPY WITH PROPOFOL N/A 05/09/2014   Procedure: COLONOSCOPY WITH PROPOFOL;  Surgeon: [name], MD;  Location: [facility];  Service: Endoscopy;  Laterality: N/A;  . CORONARY ANGIOPLASTY WITH STENT PLACEMENT  11/12/2008; 06/11/2014   stent x 2 to RCA; stent x 2 to LAD  . CORONARY STENT PLACEMENT  06/11/2014   m-LAD 3.5 x 16 mm Synergy DES, d-LAD  2.25 x 16 mm Synergy DES  . ESOPHAGOGASTRODUODENOSCOPY N/A 02/08/2013   Procedure: ESOPHAGOGASTRODUODENOSCOPY (EGD);  Surgeon: [name], MD;  Location: [facility];  Service: Endoscopy;  Laterality: N/A;  . LAPAROTOMY N/A 02/12/2013   Procedure: EXPLORATORY LAPAROTOMY PARTIAL COLECTOMY WITH COLOSTOMY;  Surgeon: [name], MD;  Location: [place];  Service: General;  Laterality: N/A;  . LAPAROTOMY N/A 02/18/2013   Procedure: EXPLORATORY LAPAROTOMY/Closure of Wound;  Surgeon: [name], MD;  Location: [place];  Service: General;  Laterality: N/A;  . LEFT HEART CATHETERIZATION WITH CORONARY ANGIOGRAM N/A 06/11/2014   Procedure: LEFT HEART CATHETERIZATION WITH CORONARY ANGIOGRAM;  Surgeon: [name], MD; CFX calcified, 30-40 percent, RCA calcified, 40/50/40%, PDA diffuse disease, LAD 40/75/90% s/p DES 2      I have reviewed the  patient's current medications. Medication Sig  aspirin EC 81 MG tablet Take 1 tablet (81 mg total) by mouth daily.  atorvastatin (LIPITOR) 40 MG tablet TAKE 1 TABLET BY MOUTH DAILY  carvedilol (COREG) 25 MG tablet Take 1 tablet (25 mg total) by mouth 2 (two) times daily with a meal.  clopidogrel (PLAVIX) 75 MG tablet TAKE 1 TABLET (75 MG TOTAL) BY MOUTH DAILY WITH BREAKFAST.  ferrous sulfate 325 (65 FE) MG tablet Take 1 tablet (325 mg total) by mouth daily with breakfast.  furosemide (LASIX) 40 MG tablet Take 1 tablet (40 mg total) by mouth daily.  Magnesium 500 MG CAPS Take 500 mg by mouth daily.  Multiple Vitamins-Minerals (MULTIVITAMIN WITH MINERALS) tablet Take 1 tablet by mouth daily.  nitroGLYCERIN (NITROSTAT) 0.4 MG SL tablet Place 0.4 mg under the tongue every 5 (five) minutes as needed for chest pain.  pantoprazole (PROTONIX) 40 MG tablet Take 1 tablet (40 mg total) by mouth daily.  potassium chloride SA (KLOR-CON M20) 20 MEQ tablet Take 1 tablet (20 mEq total) by mouth 2 (two) times daily.  ramipril (ALTACE) 1.25 MG capsule TAKE ONE CAPSULE BY MOUTH EVERY 12 HOURS  sildenafil (VIAGRA) 100 MG tablet Take 0.5 tablets (50 mg total) by mouth daily as needed for erectile dysfunction.  simethicone (GAS-X) 80 MG chewable tablet Chew 1 tablet (80 mg total) by mouth 4 (four) times daily.  spironolactone (ALDACTONE) 25 MG tablet TAKE 1 TABLET (25 MG TOTAL) BY MOUTH DAILY.  XOPENEX HFA 45 MCG/ACT inhaler INHALE 2 PUFFS INTO THE LUNGS EVERY 4 (FOUR) HOURS AS NEEDED FOR WHEEZING.   Allergies: No Known Allergies  Social History   Social History  . Marital status: Married    Spouse name: [name]  . Number of children: 1  . Years of education: N/A   Occupational History  . Not on file.   Social History Main Topics  . Smoking status: Former Smoker    Packs/day: 1.00    Years: 38.00    Types: Cigarettes    Quit date: 07/15/2008  . Smokeless tobacco: Never Used  . Alcohol use 0.0 oz/week      Comment: 06/11/2014 "I rarely have a drink; last drink was New Years; if I wanted a drink I would"  . Drug use: No  . Sexual activity: Not Currently   Other Topics Concern  . Not on file   Social History Narrative  . No narrative on file    Family History  Problem Relation Age of Onset  . Hypertension Father   . Heart disease Father     before age 34  . Diabetes Father   . Hypertension Mother   . Dementia Mother   . Parkinsonism Mother   . Prostate cancer Maternal Uncle   . Liver cancer Maternal Grandmother  dx in her 72s  . Leukemia Maternal Uncle    Family Status  Relation Status  . Father Deceased at age 79   CAD  . Mother Deceased at age 70  . Sister Alive  . Paternal Aunt Deceased at age 60   house fire  . Paternal Uncle Deceased  . Maternal Grandmother Deceased  . Maternal Grandfather Deceased  . Paternal Grandmother Deceased  . Paternal Grandfather Deceased    Review of Systems:   Full 14-point review of systems otherwise negative except as noted above.  Physical Exam: Blood pressure 103/67, pulse 92, temperature 97.7 F (36.5 C), temperature source Oral, resp. rate 19, height _0  (1.778 m), weight (!) 357 lb 8 oz (162.2 kg), SpO2 95 %. General: Well developed, well nourished,male in no acute distress. Head: Normocephalic, atraumatic, sclera non-icteric, no xanthomas, nares are without discharge. Dentition: good Neck: No carotid bruits. JVD not elevated. No thyromegally Lungs: poor inspiratory effort. Slight exp wheeze, no rhonchi.  Heart: rapid, Regular rate and rhythm with S1 S2.  No S3 or S4.  No murmur, no rubs, or gallops appreciated. Abdomen: Soft, non-tender, non-distended with normoactive bowel sounds. No hepatomegaly. No rebound/guarding. No obvious abdominal masses. Colostomy bag in place  Msk:  Strength and tone appear normal for age. No joint deformities or effusions, no spine or costo-vertebral angle tenderness. Extremities: No clubbing or  cyanosis. No edema.  Distal pedal pulses are 2+ in upper extrem, unable to palpate DP/PT in feet.  Neuro: Alert and oriented X 3. Moves all extremities spontaneously. No focal deficits noted. Psych:  Responds to questions appropriately with a normal affect. Skin: No rashes or lesions noted  Labs:   Lab Results  Component Value Date   WBC 6.1 11/13/2015   HGB 12.9 (L) 11/13/2015   HCT 41.6 11/13/2015   MCV 91.2 11/13/2015   PLT 140 (L) 11/13/2015     Recent Labs Lab 11/13/15 0928  NA 138  K 4.1  CL 106  CO2 24  BUN 19  CREATININE 1.72*  CALCIUM 9.7  GLUCOSE 104*    Recent Labs  11/13/15 0928  TROPONINI <0.03   B Natriuretic Peptide  Date/Time Value Ref Range Status  11/13/2015 09:28 AM 9.2 0.0 - 100.0 pg/mL Final    Radiology/Studies: Dg Chest Portable 1 View Result Date: 11/13/2015 CLINICAL DATA:  increased SOB x several days and tachycardia Hx: CHF, COPD, CKD, PVD EXAM: PORTABLE CHEST 1 VIEW COMPARISON:  09/30/2013 FINDINGS: Exam is lordotic. Normal mediastinum and cardiac silhouette. Normal pulmonary vasculature. No evidence of effusion, infiltrate, or pneumothorax. No acute bony abnormality. Low lung volumes IMPRESSION: No acute cardiopulmonary process.  Low lung volumes Electronically Signed   By: Suzy Bouchard M.D.   On: 11/13/2015 09:43   Echo: 2015 - Left ventricle: The cavity size was mildly dilated. Wall thickness was increased in a pattern of mild LVH. Systolic function was mildly to moderately reduced. The estimated ejection fraction was in the range of 40% to 45%. There was akinesis of the apical lateral, apical anterior, and apical septal segments. There was akinesis of the true apex. Prominent trabeculation at the apex but I do not think thrombus was present. Patient had Definity study recently to look at the apex and no thrombus was seen. Doppler parameters are consistent with abnormal left ventricular relaxation (grade 1  diastolic dysfunction). - Aortic valve: Trileaflet; moderately calcified leaflets. There was no stenosis. - Mitral valve: Moderately calcified annulus. Mildly calcified leaflets . Trivial regurgitation. -  Left atrium: The atrium was moderately dilated. - Right ventricle: The cavity size was normal. Systolic function was normal. - Right atrium: The atrium was mildly dilated. - Pulmonary arteries: No complete TR doppler jet so unable to estimate PA systolic pressure. - Systemic veins: IVC was not visualized. - Pericardium, extracardiac: A trivial pericardial effusion was identified. Impressions: - Mildly dilated LV with mild LV hypertrophy. EF 40-45% with peri-apical akinesis. I do not think that LV apical thrombus is present. Normal RV size and systolic function. Biatrial enlargement.  ECG: 09/21 Atrial tach, rate 131  ASSESSMENT AND PLAN:  Principal Problem:   Atrial tachycardia (HCC) - symptoms attributed to this are not clear - long-standing problem, unable to determine if worse than previous. - BP has limited options in the past - he has not seen EP, may need antiarrhythmic  Active Problems:   Coronary atherosclerosis - no ongoing ischemic sx   Obstructive sleep apnea - compliant w/ CPAP    Physical deconditioning - significant contributor to his DOE    Peripheral arterial disease ([place]) - no acute symptoms    Chronic combined systolic and diastolic CHF, NYHA class 3 (HCC) - weight is not elevated for him - O2 sats dropped to 87% with ambulation and he felt SOB, HR was 130s - not on home O2 - CXR and BNP are ok today - continue home Lasix dose  Unclear how much of this is acute vs chronic Admit overnight and manage medications.   Larry [name] 11/13/2015 2:39 PM Beeper [phone]    Patient seen and examined. Agree with assessment and plan.  They're been feared [place] is a 65 year old African-American male was a history of  morbid obesity, obstructive sleep apnea on CPAP therapy, ischemic cardiomyopathy, CAD with stents placed to his RCA and LAD, hypertension, hyperlipidemia, and also has a history of atrial tachycardia.  He also admits to wheezing.  History coin to his wife.  He has had asthma for many years.  He has noticed recently crease coughing.  She admits to exertional dyspnea.  He was at the cancer center yesterday in follow-up of his colon cancer and colostomy and was found have an increased heart rate.  He also he presented to the emergency room today.  He has been demonstrated to be going in and out of atrial tachycardia with heart rates up to the 130s and then he ultimately reverted back to sinus rhythm in the 90s.  Blood pressure is a aproximately 110/70.  He has a thick neck.  Mouth body scale is minimal.  3.  There was no JVD.  He had wheezing anteriorly and posteriorly, most prominent with expiration.  He had decreased breath sounds.  Rhythm was tachycardic is 454 systolic murmur.  The ileostomy was present.  He had morbid obesity.  Bowel sounds are positive.  Acute chronic stasis changes and edema to his lower extremity bilaterally.  ECG suggests atrial tachycardia (? long RP tachycardia).  He has renal insufficiency.  Creatinine is 1.72.  He has stage III chronic kidney disease.  Troponin is negative.  BMP is normal at 9.2.  Presently, Mr. Foti has evidence for wheezing.  He has been on nonselective beta blocker therapy with carvedilol at 25 mg twice a day, which potentially may be exacerbating his wheezing.  He also has been on spironolactone 25 mg daily, furosemide 40 mg daily, and ramipril 1.25 mg. with his wheezing, I would recommend discontinuance of carvedilol and switched this to more cardioselective  metoprolol, tartrate, initially at 25 mg twice a day.  I will also add verapamil 80 mg every 8 hours which should be helpful helpful with his atrial tachycardia and potential after depolarization etiology.  We  will keep the patient overnight for further evaluation.  Consider treatment with xopenex in light of his wheezing.  Renal function will need to be followed closely and if Cr increases further ramipril may need to be discontinued.   Troy Sine, MD, [facility] 11/13/2015 3:40 PM

## 2015-11-13 NOTE — Telephone Encounter (Signed)
-----   Message from Ladell Pier, MD sent at 11/12/2015  8:40 PM EDT ----- Please call patient, cea is normal

## 2015-11-13 NOTE — ED Triage Notes (Signed)
Pt here for increased SOB x several days and tachycardia

## 2015-11-13 NOTE — ED Notes (Signed)
Pt ambulated in hallway, gait steady, pt denied any dizziness.  Pt became short of breath, O2 sats decreased to 87% with heart rate increasing to 130s.  Pt denied any chest pain with ambulation.

## 2015-11-13 NOTE — ED Provider Notes (Signed)
[facility] DEPT Provider Note   CSN: [id number] Arrival date & time: 11/13/15  5701     History   Chief Complaint Chief Complaint  Patient presents with  . Shortness of Breath    HPI Larry [name] is a 65 y.o. male.  The history is provided by the patient and the spouse.  Shortness of Breath  This is a new problem. Duration: "weeks" The current episode started more than 1 week ago. The problem has been gradually worsening. Pertinent negatives include no fever and no chest pain. Associated medical issues include heart failure.  Patient with h/o atrial tachycardia, previous cardiac arrest, colon cancer, presents with tachycardia, shortness of breath, dyspnea on exertion for several weeks It is worsening It is worsened with movement and improved with rest No CP No fever/vomiting No new LE edema  Past Medical History:  Diagnosis Date  . Atrial tachycardia ([place])    managed on beta blocker therapy  . Cardiac arrest ([place])    X 2 episodes during hospital visit 12'14"electrolyte imbalance"- "Shocked"  . CHF (congestive heart failure) (HCC)    has diastolic heart failure grade 1; EF is 45 to 50% per echo 05/2011; EF 41% by Myoview 2016  . Childhood asthma    "went away after I was 16"  . CKD (chronic kidney disease) stage 3, GFR 30-59 ml/min   . Colon cancer ([place])    MSI high; IHC loss of MLH1 and PMS2; BRAF negative; Negative methylation  . COPD (chronic obstructive pulmonary disease) ([place])   . Coronary artery disease   . Family history of ovarian cancer   . Hypercholesteremia   . Hypertension   . Noncompliance   . NSVT (nonsustained ventricular tachycardia) (HCC)    beta blocker restarted  . Obesity   . OSA on CPAP    used nightly  . Peripheral arterial disease (HCC)    small ulcer the head of right metatarsal plantar surface right foot  . S/P colostomy ([place])    2014  . Thrombocytopenia [facility])     Patient Active Problem List   Diagnosis Date Noted  .  Family history of ovarian cancer   . CKD (chronic kidney disease) stage 3, GFR 30-59 ml/min 06/12/2014  . Thrombocytopenia ([place])   . Ischemic chest pain ([place]) 06/11/2014  . Atherosclerosis of artery of extremity with ulceration ([place]) 06/06/2014  . Venous stasis ulcer of left lower extremity ([place]) 06/06/2014  . Acute combined systolic and diastolic congestive heart failure ([place]) 10/03/2013  . Acute respiratory distress (HCC) 09/30/2013  . Respiratory distress 09/30/2013  . Peripheral arterial disease ([place]) 08/21/2013  . Postop check 03/26/2013  . Physical deconditioning 03/01/2013  . Torsades de pointes ([place]) 02/27/2013  . Acute on chronic diastolic heart failure ([place]) 02/15/2013  . Colon cancer ([place]) 02/09/2013  . Atrial fibrillation ([place]) 02/05/2013  . Acute renal failure ([place]) 02/02/2013  . Hypotension 02/02/2013  . Chronic blood loss anemia 02/02/2013  . Edema 06/16/2011  . Tachycardia 06/16/2011  . IMPOTENCE OF ORGANIC ORIGIN 01/12/2009  . Obstructive sleep apnea 12/04/2008  . HYPERLIPIDEMIA 12/03/2008  . Obesity 12/03/2008  . CAD 12/03/2008  . CARDIOMYOPATHY, ISCHEMIC 12/03/2008  . Atrial tachycardia, paroxysmal ([place]) 12/03/2008  . ASTHMA 12/03/2008  . C O P D 12/03/2008  . HYPERTENSION 12/02/2008    Past Surgical History:  Procedure Laterality Date  . COLON SURGERY    . COLONOSCOPY N/A 02/08/2013   Procedure: COLONOSCOPY;  Surgeon: [name], MD;  Location: [facility];  Service: Endoscopy;  Laterality: N/A;  . COLONOSCOPY WITH PROPOFOL N/A 05/09/2014   Procedure: COLONOSCOPY WITH PROPOFOL;  Surgeon: [name], MD;  Location: [facility] ENDOSCOPY;  Service: Endoscopy;  Laterality: N/A;  . CORONARY ANGIOPLASTY WITH STENT PLACEMENT  11/12/2008; 06/11/2014   stent x 2 to RCA; stent x 2 to LAD  . CORONARY STENT PLACEMENT  06/11/2014   m-LAD 3.5 x 16 mm Synergy DES, d-LAD  2.25 x 16 mm Synergy DES  . ESOPHAGOGASTRODUODENOSCOPY N/A 02/08/2013   Procedure:  ESOPHAGOGASTRODUODENOSCOPY (EGD);  Surgeon: [name], MD;  Location: [facility] ENDOSCOPY;  Service: Endoscopy;  Laterality: N/A;  . LAPAROTOMY N/A 02/12/2013   Procedure: EXPLORATORY LAPAROTOMY PARTIAL COLECTOMY WITH COLOSTOMY;  Surgeon: [name], MD;  Location: [place];  Service: General;  Laterality: N/A;  . LAPAROTOMY N/A 02/18/2013   Procedure: EXPLORATORY LAPAROTOMY/Closure of Wound;  Surgeon: [name], MD;  Location: [facility];  Service: General;  Laterality: N/A;  . LEFT HEART CATHETERIZATION WITH CORONARY ANGIOGRAM N/A 06/11/2014   Procedure: LEFT HEART CATHETERIZATION WITH CORONARY ANGIOGRAM;  Surgeon: [name], MD; CFX calcified, 30-40 percent, RCA calcified, 40/50/40%, PDA diffuse disease, LAD 40/75/90% s/p DES 2        Home Medications    Prior to Admission medications   Medication Sig Start Date End Date Taking? Authorizing Provider  aspirin EC 81 MG tablet Take 1 tablet (81 mg total) by mouth daily. 03/06/13   Ivan Anchors Love, PA-C  atorvastatin (LIPITOR) 40 MG tablet TAKE 1 TABLET BY MOUTH DAILY 03/31/15   Burtis Junes, NP  carvedilol (COREG) 25 MG tablet Take 1 tablet (25 mg total) by mouth 2 (two) times daily with a meal. 11/12/15   Burtis Junes, NP  clopidogrel (PLAVIX) 75 MG tablet TAKE 1 TABLET (75 MG TOTAL) BY MOUTH DAILY WITH BREAKFAST. 09/07/15   Evelene Croon Barrett, PA-C  ferrous sulfate 325 (65 FE) MG tablet Take 1 tablet (325 mg total) by mouth daily with breakfast. 01/15/14   Burtis Junes, NP  furosemide (LASIX) 40 MG tablet Take 1 tablet (40 mg total) by mouth daily. 11/06/15   Jettie Booze, MD  Magnesium 500 MG CAPS Take 500 mg by mouth daily.    Historical Provider, MD  Multiple Vitamins-Minerals (MULTIVITAMIN WITH MINERALS) tablet Take 1 tablet by mouth daily.    Historical Provider, MD  nitroGLYCERIN (NITROSTAT) 0.4 MG SL tablet Place 0.4 mg under the tongue every 5 (five) minutes as needed for chest pain.    Historical Provider, MD    pantoprazole (PROTONIX) 40 MG tablet Take 1 tablet (40 mg total) by mouth daily. 10/10/14   Burtis Junes, NP  pantoprazole (PROTONIX) 40 MG tablet TAKE 1 TABLET (40 MG TOTAL) BY MOUTH AT BEDTIME. 07/23/15   Burtis Junes, NP  potassium chloride SA (KLOR-CON M20) 20 MEQ tablet Take 1 tablet (20 mEq total) by mouth 2 (two) times daily. 11/06/15   Thompson Grayer, MD  ramipril (ALTACE) 1.25 MG capsule TAKE ONE CAPSULE BY MOUTH EVERY 12 HOURS 12/30/14   Thompson Grayer, MD  sildenafil (VIAGRA) 100 MG tablet Take 0.5 tablets (50 mg total) by mouth daily as needed for erectile dysfunction. 09/24/14   Burtis Junes, NP  simethicone (GAS-X) 80 MG chewable tablet Chew 1 tablet (80 mg total) by mouth 4 (four) times daily. 03/06/13   Bary Leriche, PA-C  spironolactone (ALDACTONE) 25 MG tablet TAKE 1 TABLET (25 MG TOTAL) BY MOUTH DAILY. 08/24/15   Legrand Como  Burt Knack, MD  XOPENEX HFA 45 MCG/ACT inhaler INHALE 2 PUFFS INTO THE LUNGS EVERY 4 (FOUR) HOURS AS NEEDED FOR WHEEZING. 06/02/15   Burtis Junes, NP    Family History Family History  Problem Relation Age of Onset  . Hypertension Father   . Heart disease Father     before age 31  . Diabetes Father   . Hypertension Mother   . Dementia Mother   . Parkinsonism Mother   . Prostate cancer Maternal Uncle   . Liver cancer Maternal Grandmother     dx in her 61s  . Leukemia Maternal Uncle     Social History Social History  Substance Use Topics  . Smoking status: Former Smoker    Packs/day: 1.00    Years: 38.00    Types: Cigarettes    Quit date: 07/15/2008  . Smokeless tobacco: Never Used  . Alcohol use 0.0 oz/week     Comment: 06/11/2014 "I rarely have a drink; last drink was New Years; if I wanted a drink I would"     Allergies   Review of patient's allergies indicates no known allergies.   Review of Systems Review of Systems  Constitutional: Negative for fever.  Respiratory: Positive for shortness of breath.   Cardiovascular: Negative for chest  pain.       Chronic LE edema   Gastrointestinal: Negative for blood in stool.  All other systems reviewed and are negative.    Physical Exam Updated Vital Signs BP (!) 152/102 (BP Location: Right Arm)   Pulse 115   Temp 97.7 F (36.5 C) (Oral)   Resp 18   Ht 5' 10"  (1.778 m)   Wt (!) 162.2 kg   SpO2 97%   BMI 51.30 kg/m   Physical Exam CONSTITUTIONAL: Well developed/well nourished HEAD: Normocephalic/atraumatic EYES: EOMI/PERRL ENMT: Mucous membranes moist NECK: supple no meningeal signs SPINE/BACK:entire spine nontender CV: distant heart sounds, tachycardic LUNGS: Lungs are clear to auscultation bilaterally, no apparent distress ABDOMEN: soft, nontender, obese, well healed scar, colostomy noted GU:no cva tenderness NEURO: Pt is awake/alert/appropriate, moves all extremitiesx4.  No facial droop.   EXTREMITIES: pulses normal/equal, full ROM, chronic edema to LE SKIN: warm, color normal PSYCH: no abnormalities of mood noted, alert and oriented to situation   ED Treatments / Results  Labs (all labs ordered are listed, but only abnormal results are displayed) Labs Reviewed  BASIC METABOLIC PANEL - Abnormal; Notable for the following:       Result Value   Glucose, Bld 104 (*)    Creatinine, Ser 1.72 (*)    GFR calc non Af Amer 41 (*)    GFR calc Af Amer 47 (*)    All other components within normal limits  CBC WITH DIFFERENTIAL/PLATELET - Abnormal; Notable for the following:    Hemoglobin 12.9 (*)    Platelets 140 (*)    All other components within normal limits  TROPONIN I  BRAIN NATRIURETIC PEPTIDE  MAGNESIUM    EKG  EKG Interpretation  Date/Time:  Friday November 13 2015 08:45:26 EDT Ventricular Rate:  131 PR Interval:    QRS Duration: 98 QT Interval:  400 QTC Calculation: 590 R Axis:   100 Text Interpretation:  ** Critical Test Result: Long QTc Supraventricular tachycardia Rightward axis Borderline ECG Abnormal ekg Confirmed by Christy Gentles  MD, Aryahi Denzler  [phone]) on 11/13/2015 9:13:37 AM       EKG Interpretation  Date/Time:  Friday November 13 2015 12:47:22 EDT Ventricular Rate:  129 PR Interval:  QRS Duration: 110 QT Interval:  364 QTC Calculation: 534 R Axis:   111 Text Interpretation:  Sinus tachycardia Left posterior fascicular block Low voltage, extremity leads Prolonged QT interval Confirmed by Christy Gentles  MD, Gara Kincade ([id number]) on 11/13/2015 1:12:43 PM       Radiology Dg Chest Portable 1 View  Result Date: 11/13/2015 CLINICAL DATA:  increased SOB x several days and tachycardia Hx: CHF, COPD, CKD, PVD EXAM: PORTABLE CHEST 1 VIEW COMPARISON:  09/30/2013 FINDINGS: Exam is lordotic. Normal mediastinum and cardiac silhouette. Normal pulmonary vasculature. No evidence of effusion, infiltrate, or pneumothorax. No acute bony abnormality. Low lung volumes IMPRESSION: No acute cardiopulmonary process.  Low lung volumes Electronically Signed   By: Suzy Bouchard M.D.   On: 11/13/2015 09:43    Procedures Procedures (including critical care time)  Medications Ordered in ED Medications  carvedilol (COREG) tablet 25 mg (25 mg Oral Given 11/13/15 1145)     Initial Impression / Assessment and Plan / ED Course  I have reviewed the triage vital signs and the nursing notes.  Pertinent labs & imaging results that were available during my care of the patient were reviewed by me and considered in my medical decision making (see chart for details).  Clinical Course    Pt here for sob/dyspnea on exertion for weeks He is tachycardic, though awake/alert.  BP is appropriate He has prolonged QT EKG (it appear p wave is buried in QT) He has h/o torsades de pointe but none currently Labs pending Will continue to monitor Defer meds for now 10:46 AM Discussed case via phone with dr Johnsie Cancel We reviewed labs/history He reviewed EKG He feels patient can increase coreg to 25TID He will arrange f/u with EP as outpatient next week 1:13 PM Pt still  tachycardic and reports dyspnea on exertion Will consult cardiology 1:43 PM D/w cardiology Will see patient I doubt PE at this time given symptoms have been progressing for weeks Will need evaluation for prolonged QTc Final Clinical Impressions(s) / ED Diagnoses   Final diagnoses:  Palpitations  Tachycardia  Dyspnea on exertion  Prolonged Q-T interval on ECG    New Prescriptions New Prescriptions   No medications on file     Ripley Fraise, MD 11/13/15 1344

## 2015-11-13 NOTE — Progress Notes (Signed)
Patient arrived the unit from the ER assessment completed see flowsheet, placed on tele ccmd notified, patient oriented to room and staff, call light within reach bed in lowest position will continue to monitor  

## 2015-11-13 NOTE — Telephone Encounter (Signed)
Per Dr. Benay Spice, message left to inform patient that cea is normal on private home phone.  Pt instructed to call [facility] back with any questions or concerns.

## 2015-11-14 ENCOUNTER — Encounter (HOSPITAL_COMMUNITY): Payer: Self-pay | Admitting: Internal Medicine

## 2015-11-14 ENCOUNTER — Inpatient Hospital Stay (HOSPITAL_COMMUNITY): Payer: Medicare Other

## 2015-11-14 DIAGNOSIS — Z9229 Personal history of other drug therapy: Secondary | ICD-10-CM

## 2015-11-14 DIAGNOSIS — Z7982 Long term (current) use of aspirin: Secondary | ICD-10-CM | POA: Diagnosis not present

## 2015-11-14 DIAGNOSIS — Z79899 Other long term (current) drug therapy: Secondary | ICD-10-CM | POA: Diagnosis not present

## 2015-11-14 DIAGNOSIS — J441 Chronic obstructive pulmonary disease with (acute) exacerbation: Secondary | ICD-10-CM

## 2015-11-14 DIAGNOSIS — Z8249 Family history of ischemic heart disease and other diseases of the circulatory system: Secondary | ICD-10-CM | POA: Diagnosis not present

## 2015-11-14 DIAGNOSIS — J449 Chronic obstructive pulmonary disease, unspecified: Secondary | ICD-10-CM | POA: Diagnosis present

## 2015-11-14 DIAGNOSIS — I255 Ischemic cardiomyopathy: Secondary | ICD-10-CM | POA: Diagnosis present

## 2015-11-14 DIAGNOSIS — E785 Hyperlipidemia, unspecified: Secondary | ICD-10-CM | POA: Diagnosis present

## 2015-11-14 DIAGNOSIS — R0609 Other forms of dyspnea: Secondary | ICD-10-CM | POA: Diagnosis not present

## 2015-11-14 DIAGNOSIS — I739 Peripheral vascular disease, unspecified: Secondary | ICD-10-CM | POA: Diagnosis present

## 2015-11-14 DIAGNOSIS — I5042 Chronic combined systolic (congestive) and diastolic (congestive) heart failure: Secondary | ICD-10-CM | POA: Diagnosis present

## 2015-11-14 DIAGNOSIS — N183 Chronic kidney disease, stage 3 (moderate): Secondary | ICD-10-CM | POA: Diagnosis present

## 2015-11-14 DIAGNOSIS — R05 Cough: Secondary | ICD-10-CM | POA: Diagnosis not present

## 2015-11-14 DIAGNOSIS — G4733 Obstructive sleep apnea (adult) (pediatric): Secondary | ICD-10-CM | POA: Diagnosis present

## 2015-11-14 DIAGNOSIS — R911 Solitary pulmonary nodule: Secondary | ICD-10-CM | POA: Diagnosis not present

## 2015-11-14 DIAGNOSIS — R059 Cough, unspecified: Secondary | ICD-10-CM

## 2015-11-14 DIAGNOSIS — R0600 Dyspnea, unspecified: Secondary | ICD-10-CM | POA: Diagnosis not present

## 2015-11-14 DIAGNOSIS — Z955 Presence of coronary angioplasty implant and graft: Secondary | ICD-10-CM | POA: Diagnosis not present

## 2015-11-14 DIAGNOSIS — I2510 Atherosclerotic heart disease of native coronary artery without angina pectoris: Secondary | ICD-10-CM | POA: Diagnosis present

## 2015-11-14 DIAGNOSIS — Z6841 Body Mass Index (BMI) 40.0 and over, adult: Secondary | ICD-10-CM | POA: Diagnosis not present

## 2015-11-14 DIAGNOSIS — Z85038 Personal history of other malignant neoplasm of large intestine: Secondary | ICD-10-CM | POA: Diagnosis not present

## 2015-11-14 DIAGNOSIS — I472 Ventricular tachycardia: Secondary | ICD-10-CM | POA: Diagnosis present

## 2015-11-14 DIAGNOSIS — Z933 Colostomy status: Secondary | ICD-10-CM | POA: Diagnosis not present

## 2015-11-14 DIAGNOSIS — Z7902 Long term (current) use of antithrombotics/antiplatelets: Secondary | ICD-10-CM | POA: Diagnosis not present

## 2015-11-14 DIAGNOSIS — R918 Other nonspecific abnormal finding of lung field: Secondary | ICD-10-CM | POA: Diagnosis not present

## 2015-11-14 DIAGNOSIS — Z87891 Personal history of nicotine dependence: Secondary | ICD-10-CM | POA: Diagnosis not present

## 2015-11-14 DIAGNOSIS — Z8674 Personal history of sudden cardiac arrest: Secondary | ICD-10-CM | POA: Diagnosis not present

## 2015-11-14 DIAGNOSIS — I130 Hypertensive heart and chronic kidney disease with heart failure and stage 1 through stage 4 chronic kidney disease, or unspecified chronic kidney disease: Secondary | ICD-10-CM | POA: Diagnosis present

## 2015-11-14 DIAGNOSIS — I471 Supraventricular tachycardia: Secondary | ICD-10-CM | POA: Diagnosis present

## 2015-11-14 DIAGNOSIS — R0602 Shortness of breath: Secondary | ICD-10-CM | POA: Diagnosis not present

## 2015-11-14 LAB — COMPREHENSIVE METABOLIC PANEL
ALBUMIN: 3.5 g/dL (ref 3.5–5.0)
ALT: 22 U/L (ref 17–63)
AST: 20 U/L (ref 15–41)
Alkaline Phosphatase: 103 U/L (ref 38–126)
Anion gap: 9 (ref 5–15)
BUN: 16 mg/dL (ref 6–20)
CHLORIDE: 105 mmol/L (ref 101–111)
CO2: 24 mmol/L (ref 22–32)
CREATININE: 1.7 mg/dL — AB (ref 0.61–1.24)
Calcium: 9.9 mg/dL (ref 8.9–10.3)
GFR calc Af Amer: 48 mL/min — ABNORMAL LOW (ref >60)
GFR, EST NON AFRICAN AMERICAN: 41 mL/min — AB (ref >60)
Glucose, Bld: 96 mg/dL (ref 65–99)
POTASSIUM: 4.5 mmol/L (ref 3.5–5.1)
SODIUM: 138 mmol/L (ref 135–145)
Total Bilirubin: 1.5 mg/dL — ABNORMAL HIGH (ref 0.3–1.2)
Total Protein: 7.3 g/dL (ref 6.5–8.1)

## 2015-11-14 LAB — TROPONIN I: Troponin I: <0.03 ng/mL (ref <0.03)

## 2015-11-14 NOTE — Consult Note (Addendum)
PULMONARY / CRITICAL CARE MEDICINE   Name: Larry [name] MRN: [id number] DOB: 1950/03/26    ADMISSION DATE:  11/13/2015 CONSULTATION DATE:  11/14/15  REFERRING MD:  Dr Caryl Comes of EP  CHIEF COMPLAINT:   Dyspnea and possble copd, smoker  HISTORY OF PRESENT ILLNESS:   65 year old obese African-American male and externally poor historian former 30 pack smoker with multiple medical problems including obesity, combined chronic systolic and diastole heart failure and chronic kidney disease. He tells me that at baseline he is disabled. He is sedentary. He only does simple activities of daily living and for this he is in general is not short of breath or having cough. More recently just prior to admission he started having cough and shortness of breath and was progressive and he got admitted. This was happening over a week or 2. Since then he is being treated for atrial arrhythmias. Reviewed cardiology notes. There is concern that he might have underlying lung disease given his history of smoking and resistant atrial tachycardia. Patient himself denies any diagnosis of prior lung disease. A 2013 CT scan of the chest showed some lung nodules for which she has not had follow-up. There is no report of any emphysema or interstitial lung disease and that non-high-resolution CT chest. I'm unable to get any other history from him other than as documented below.  PAST MEDICAL HISTORY :  He  has a past medical history of Atrial tachycardia ([employer]); Childhood asthma; Chronic combined systolic and diastolic CHF, NYHA class 3 (HCC); CKD (chronic kidney disease) stage 3, GFR 30-59 ml/min; Colon cancer (HCC); COPD (chronic obstructive pulmonary disease) ([place]); Coronary artery disease; Family history of ovarian cancer; Hypercholesteremia; Hypertension; Noncompliance; NSVT (nonsustained ventricular tachycardia) ([place]); Obesity; OSA on CPAP; Peripheral arterial disease ([place]); S/P colostomy ([place]); Thrombocytopenia ([place]); and  Torsades de pointes ([place]).  PAST SURGICAL HISTORY: He  has a past surgical history that includes Colonoscopy (N/A, 02/08/2013); Esophagogastroduodenoscopy (N/A, 02/08/2013); laparotomy (N/A, 02/12/2013); laparotomy (N/A, 02/18/2013); Coronary angioplasty with stent (11/12/2008; 06/11/2014); Colon surgery; left heart catheterization with coronary angiogram (N/A, 06/11/2014); Coronary stent placement (06/11/2014); and Colonoscopy with propofol (N/A, 05/09/2014).  No Known Allergies  No current facility-administered medications on file prior to encounter.    Current Outpatient Prescriptions on File Prior to Encounter  Medication Sig  . aspirin EC 81 MG tablet Take 1 tablet (81 mg total) by mouth daily.  [name] Kitchen atorvastatin (LIPITOR) 40 MG tablet TAKE 1 TABLET BY MOUTH DAILY  . carvedilol (COREG) 25 MG tablet Take 1 tablet (25 mg total) by mouth 2 (two) times daily with a meal.  . clopidogrel (PLAVIX) 75 MG tablet TAKE 1 TABLET (75 MG TOTAL) BY MOUTH DAILY WITH BREAKFAST.  . ferrous sulfate 325 (65 FE) MG tablet Take 1 tablet (325 mg total) by mouth daily with breakfast.  . furosemide (LASIX) 40 MG tablet Take 1 tablet (40 mg total) by mouth daily.  . Magnesium 500 MG CAPS Take 500 mg by mouth daily.  . Multiple Vitamins-Minerals (MULTIVITAMIN WITH MINERALS) tablet Take 1 tablet by mouth daily.  . nitroGLYCERIN (NITROSTAT) 0.4 MG SL tablet Place 0.4 mg under the tongue every 5 (five) minutes as needed for chest pain.  . pantoprazole (PROTONIX) 40 MG tablet Take 1 tablet (40 mg total) by mouth daily.  . potassium chloride SA (KLOR-CON M20) 20 MEQ tablet Take 1 tablet (20 mEq total) by mouth 2 (two) times daily.  . ramipril (ALTACE) 1.25 MG capsule TAKE ONE CAPSULE BY MOUTH EVERY 12 HOURS  .  sildenafil (VIAGRA) 100 MG tablet Take 0.5 tablets (50 mg total) by mouth daily as needed for erectile dysfunction.  . simethicone (GAS-X) 80 MG chewable tablet Chew 1 tablet (80 mg total) by mouth 4 (four) times daily.   [name] Kitchen spironolactone (ALDACTONE) 25 MG tablet TAKE 1 TABLET (25 MG TOTAL) BY MOUTH DAILY.  Penne Lash HFA 45 MCG/ACT inhaler INHALE 2 PUFFS INTO THE LUNGS EVERY 4 (FOUR) HOURS AS NEEDED FOR WHEEZING.    FAMILY HISTORY:  His indicated that his mother is deceased. He indicated that his father is deceased. He indicated that his sister is alive. He indicated that his maternal grandmother is deceased. He indicated that his maternal grandfather is deceased. He indicated that his paternal grandmother is deceased. He indicated that his paternal grandfather is deceased. He indicated that his paternal aunt is deceased. He indicated that his paternal uncle is deceased.    SOCIAL HISTORY: He  reports that he quit smoking about 7 years ago. His smoking use included Cigarettes. He has a 38.00 pack-year smoking history. He has never used smokeless tobacco. He reports that he drinks alcohol. He reports that he does not use drugs.    VITAL SIGNS: BP 128/65 (BP Location: Left Arm)   Pulse 95   Temp 98.7 F (37.1 C) (Oral)   Resp 20   Ht 5\' 10"  (1.778 m)   Wt (!) 160.3 kg (353 lb 4.8 oz)   SpO2 97%   BMI 50.69 kg/m   HEMODYNAMICS:    VENTILATOR SETTINGS:    INTAKE / OUTPUT: I/O last 3 completed shifts: In: 480 [P.O.:480] Out: 925 [Urine:725; Stool:200]  PHYSICAL EXAMINATION: General:  Obese male lying down at 45 Neuro:  Alert and oriented 3. Speech normal HEENT:  Mallampati class III-IV Cardiovascular:  Irregularly irregular. Normal blood pressure Lungs:  Overall diminished due to obesity. Clear to auscultation no wheezes no crackles Abdomen:  Significant visceral obesity present. Soft Musculoskeletal:  Chronic 1-2 plus edema present Skin:  Intact in the exposed areas  LABS:  BMET  Recent Labs Lab 11/13/15 0928 11/14/15 0538  NA 138 138  K 4.1 4.5  CL 106 105  CO2 24 24  BUN 19 16  CREATININE 1.72* 1.70*  GLUCOSE 104* 96    Electrolytes  Recent Labs Lab 11/13/15 0928  11/14/15 0538  CALCIUM 9.7 9.9  MG 2.2  --     CBC  Recent Labs Lab 11/12/15 1151 11/13/15 0928  WBC 6.6 6.1  HGB 13.1 12.9*  HCT 40.9 41.6  PLT 125* 140*    Coag's No results for input(s): APTT, INR in the last 168 hours.  Sepsis Markers No results for input(s): LATICACIDVEN, PROCALCITON, O2SATVEN in the last 168 hours.  ABG No results for input(s): PHART, PCO2ART, PO2ART in the last 168 hours.  Liver Enzymes  Recent Labs Lab 11/14/15 0538  AST 20  ALT 22  ALKPHOS 103  BILITOT 1.5*  ALBUMIN 3.5    Cardiac Enzymes  Recent Labs Lab 11/13/15 1828 11/14/15 0104 11/14/15 0538  TROPONINI <0.03 <0.03 <0.03    Glucose No results for input(s): GLUCAP in the last 168 hours.  Imaging No results found.   ASSESSMENT / PLAN:  PULMONARY A: Baseline heavy smoker in the past. Sedentary because of chronic systolic heart failure and obesity. Therefore baseline dyspnea and cough unclear duration and severity. At risk for interstitial lung disease or emphysema due to 30 pack smoking history. He has a history of nodules 5 mm in 2013 that  require follow-up  P:   High-resolution CT chest without contrast supine and prone images Full pulmonary function test Depending on these results will check for overnight oxygen study and walking desaturation test      Dr. Brand Males, M.D., [facility]C.P Pulmonary and Critical Care Medicine Staff Physician [facility] Pager: [phone], If no answer or between  15:00h - 7:00h: call [phone]  11/14/2015 1:25 PM

## 2015-11-14 NOTE — Progress Notes (Signed)
CPAP machine unavailable at this time. RN is aware. RT will bring cpap as it becomes available.

## 2015-11-14 NOTE — Progress Notes (Addendum)
SATURATION QUALIFICATIONS: (This note is used to comply with regulatory documentation for home oxygen)  Patient Saturations on Room Air at Rest = 95%  Patient Saturations on Room Air while Ambulating = 95-98%  Patient Saturations on 0 Liters of oxygen while Ambulating = 95-98%  Please briefly explain why patient needs home oxygen: No need for oxygen at home.  Patient ambulated in hallway without oxygen approximately 100 feet without issue.  Tolerated well.

## 2015-11-14 NOTE — Progress Notes (Signed)
Called RT for CPAP HS. There are no available machines at this time. Will continue to work on getting one when available.

## 2015-11-14 NOTE — Progress Notes (Signed)
Patient Name: Larry [name]      SUBJECTIVE: Admitted 9/22 with shortness of breath and cough and found to have recurring episodes of atrial tachycardia. He was also noted to be wheezing. This prompted a change in his beta blockers to more selective carvedilol--metoprolol and the addition of verapamil.[name] Kitchen  He is unaware of tachycardia. He's had no fever and his white count was normal. Troponins are negative 3 BNP was less than 10  He has known coronary artery disease modest depression of LV EF 40% 2016. He has a history of prior stents 1/15 during acute illness, develped TdP with QT >700   These records were reviewed; unfortunately EP was not involved, and an explanation for event is not forthcoming  QT 4/16 was normal with controlled rate   Cough better this am,  Responded last pm to neb Treatment  Hx of nebs    Past Medical History:  Diagnosis Date  . Atrial tachycardia ([place])    managed on beta blocker therapy  . Cardiac arrest ([place])    X 2 episodes during hospital visit 12'14"electrolyte imbalance"- "Shocked"  . Childhood asthma    "went away after I was 16"  . Chronic combined systolic and diastolic CHF, NYHA class 3 (HCC)    has diastolic heart failure grade 1; EF is 45 to 50% per echo 05/2011; EF 41% by Myoview 2016  . CKD (chronic kidney disease) stage 3, GFR 30-59 ml/min   . Colon cancer ([place])    MSI high; IHC loss of MLH1 and PMS2; BRAF negative; Negative methylation  . COPD (chronic obstructive pulmonary disease) ([place])   . Coronary artery disease   . Family history of ovarian cancer   . Hypercholesteremia   . Hypertension   . Noncompliance   . NSVT (nonsustained ventricular tachycardia) (HCC)    beta blocker restarted  . Obesity   . OSA on CPAP    used nightly  . Peripheral arterial disease (HCC)    small ulcer the head of right metatarsal plantar surface right foot  . S/P colostomy ([place])    2014  . Thrombocytopenia ([place])     Scheduled  Meds:  Scheduled Meds: . aspirin EC  81 mg Oral Daily  . atorvastatin  40 mg Oral Daily  . clopidogrel  75 mg Oral Daily  . enoxaparin (LOVENOX) injection  40 mg Subcutaneous Q24H  . ferrous sulfate  325 mg Oral Q breakfast  . furosemide  20 mg Oral Daily  . magnesium oxide  400 mg Oral Daily  . metoprolol tartrate  25 mg Oral BID  . multivitamin with minerals  1 tablet Oral Daily  . pantoprazole  40 mg Oral Daily  . potassium chloride SA  20 mEq Oral BID  . ramipril  1.25 mg Oral Daily  . simethicone  80 mg Oral QID  . sodium chloride flush  3 mL Intravenous Q12H  . spironolactone  12.5 mg Oral BID  . verapamil  80 mg Oral Q8H   Continuous Infusions:  sodium chloride, acetaminophen, ALPRAZolam, levalbuterol, nitroGLYCERIN, sodium chloride flush, zolpidem    PHYSICAL EXAM Vitals:   11/13/15 1706 11/13/15 1747 11/13/15 2038 11/14/15 0501  BP:  131/81 124/69 128/65  Pulse: 87 (!) 125 90 95  Resp: 21 20 20 20   Temp:  98.2 F (36.8 C) 98.4 F (36.9 C) 98.7 F (37.1 C)  TempSrc:  Oral Oral Oral  SpO2: 96% 100% 95% 97%  Weight:  [name] Kitchen)  353 lb 12.8 oz (160.5 kg)  (!) 353 lb 4.8 oz (160.3 kg)  Height:  5' 10"  (1.778 m)     Well developed and Morbidly obese  in mild respdistress HENT normal Neck supple with JVP-flat Decreased breathsounds and wheezing  Regular rate and rhythm, no murmurs or gallops Abd-soft with active BS No Clubbing cyanosis edema Skin-warm and dry A & Oriented  Grossly normal sensory and motor function   TELEMETRY: Reviewed telemetry pt in  Atrial tach but less overnight    Intake/Output Summary (Last 24 hours) at 11/14/15 0806 Last data filed at 11/14/15 0504  Gross per 24 hour  Intake              480 ml  Output              925 ml  Net             -445 ml    LABS: Basic Metabolic Panel:  Recent Labs Lab 11/13/15 0928 11/14/15 0538  NA 138 138  K 4.1 4.5  CL 106 105  CO2 24 24  GLUCOSE 104* 96  BUN 19 16  CREATININE 1.72* 1.70*    CALCIUM 9.7 9.9  MG 2.2  --    Cardiac Enzymes:  Recent Labs  11/13/15 1828 11/14/15 0104 11/14/15 0538  TROPONINI <0.03 <0.03 <0.03   CBC:  Recent Labs Lab 11/12/15 1151 11/13/15 0928  WBC 6.6 6.1  NEUTROABS 3.9 3.8  HGB 13.1 12.9*  HCT 40.9 41.6  MCV 88.2 91.2  PLT 125* 140*   PROTIME: No results for input(s): LABPROT, INR in the last 72 hours. Liver Function Tests:  Recent Labs  11/14/15 0538  AST 20  ALT 22  ALKPHOS 103  BILITOT 1.5*  PROT 7.3  ALBUMIN 3.5   No results for input(s): LIPASE, AMYLASE in the last 72 hours. BNP: BNP (last 3 results)  Recent Labs  02/03/15 0945 11/13/15 0928  BNP 6.4 9.2    ProBNP (last 3 results) No results for input(s): PROBNP in the last 8760 hours.  D-Dimer: No results for input(s): DDIMER in the last 72 hours. Hemoglobin A1C: No results for input(s): HGBA1C in the last 72 hours. Fasting Lipid Panel: No results for input(s): CHOL, HDL, LDLCALC, TRIG, CHOLHDL, LDLDIRECT in the last 72 hours. Thyroid Function Tests:  Recent Labs  11/13/15 1828  TSH 0.962       ASSESSMENT AND PLAN:  Principal Problem:   Atrial tachycardia (HCC) Active Problems:   Coronary atherosclerosis   Obstructive sleep apnea   Physical deconditioning   Peripheral arterial disease (HCC) Torsades de pointes    Chronic combined systolic and diastolic CHF, NYHA class 3 (HCC) Renal insufficiency grade 3  reviwew of old chart is daunting in terms of use of antiarrhythmics with CAD and TdP-- the only real option would prob be amio  But even that... So will try and push forward with Verapamil but would anticpate plan incl ablation as outpt   And have recommended the use ofFITBIT WHEN he goes home to get some idea of the burden of his arrhythmia  Have also called pulm to see him as I suspect the lung is actually the driver of his problem and the atrial tach which is long standing is secondary.  Signed, Virl Axe  MD  11/14/2015

## 2015-11-14 NOTE — Progress Notes (Signed)
Pt left floor for CT scan, radiology called and stated pt was anxious and SOB when laying flat to get scanned. Pt placed on 2L of O2. Pt coached on deep breathing techniques. Card Fellow: Corotto made aware of pt behavior. Corotto stated if pt still anxious upon assessment to bring pt back to 2W and resume CT scan for the am. Radiology stated pt stable and able to complete scan, went down to check on pt but pt had already returned to unit. No distress, vital sign stable upon assessment.

## 2015-11-15 DIAGNOSIS — R0600 Dyspnea, unspecified: Secondary | ICD-10-CM

## 2015-11-15 DIAGNOSIS — R918 Other nonspecific abnormal finding of lung field: Secondary | ICD-10-CM

## 2015-11-15 MED ORDER — VERAPAMIL HCL ER 240 MG PO TBCR
240.0000 mg | EXTENDED_RELEASE_TABLET | Freq: Every day | ORAL | Status: DC
  Administered 2015-11-15: 240 mg via ORAL
  Filled 2015-11-15: qty 1

## 2015-11-15 MED ORDER — VERAPAMIL HCL ER 240 MG PO TBCR: 240.0000 mg | EXTENDED_RELEASE_TABLET | Freq: Every day | ORAL | 6 refills | Status: DC

## 2015-11-15 MED ORDER — METOPROLOL TARTRATE 25 MG PO TABS: 25.0000 mg | ORAL_TABLET | Freq: Two times a day (BID) | ORAL | 6 refills | Status: DC

## 2015-11-15 NOTE — Consult Note (Addendum)
PULMONARY / CRITICAL CARE MEDICINE   Name: Larry [name] MRN: [id number] DOB: Dec 07, 1950    ADMISSION DATE:  11/13/2015 CONSULTATION DATE:  11/14/15  REFERRING MD:  Dr Caryl Comes of EP  CHIEF COMPLAINT:   Dyspnea and possble copd, smoker BRIEF 65 year old obese African-American male and externally poor historian former 30 pack smoker with multiple medical problems including obesity, combined chronic systolic and diastole heart failure and chronic kidney disease. He tells me that at baseline he is disabled. He is sedentary. He only does simple activities of daily living and for this he is in general is not short of breath or having cough. More recently just prior to admission he started having cough and shortness of breath and was progressive and he got admitted. This was happening over a week or 2. Since then he is being treated for atrial arrhythmias. Reviewed cardiology notes. There is concern that he might have underlying lung disease given his history of smoking and resistant atrial tachycardia. Patient himself denies any diagnosis of prior lung disease. A 2013 CT scan of the chest showed some lung nodules for which she has not had follow-up. There is no report of any emphysema or interstitial lung disease and that non-high-resolution CT chest. I'm unable to get any other history from him other than as documented below.  PAST MEDICAL HISTORY :  He  has a past medical history of Atrial tachycardia ([place]); Childhood asthma; Chronic combined systolic and diastolic CHF, NYHA class 3 (HCC); CKD (chronic kidney disease) stage 3, GFR 30-59 ml/min; Colon cancer (HCC); COPD (chronic obstructive pulmonary disease) ([place]); Coronary artery disease; Family history of ovarian cancer; Hypercholesteremia; Hypertension; Noncompliance; NSVT (nonsustained ventricular tachycardia) ([place]); Obesity; OSA on CPAP; Peripheral arterial disease ([place]); S/P colostomy ([place]); Thrombocytopenia ([place]); and Torsades de pointes  ([place]).  PAST SURGICAL HISTORY: He  has a past surgical history that includes Colonoscopy (N/A, 02/08/2013); Esophagogastroduodenoscopy (N/A, 02/08/2013); laparotomy (N/A, 02/12/2013); laparotomy (N/A, 02/18/2013); Coronary angioplasty with stent (11/12/2008; 06/11/2014); Colon surgery; left heart catheterization with coronary angiogram (N/A, 06/11/2014); Coronary stent placement (06/11/2014); and Colonoscopy with propofol (N/A, 05/09/2014).    SUBJECTIVE/OVERNIGHT/INTERVAL HX 11/15/15 - no change. Per Dr Caryl Comes had wheezing and nebs helped but concern if wheezing is upper airway. Noted to be on ace inhibitor. Waked 1 length of 2W hallway real slow - did not desaturate  Pulse ox stayed at 95%. HR consistently at 115/min. Felt dyspneic and reported that walking was longer than what he does at home. HRCT with GGO in UL   INTAKE / OUTPUT: I/O last 3 completed shifts: In: 240 [P.O.:240] Out: 2275 [Urine:2075; Stool:200]  PHYSICAL EXAMINATION: General:  Obese male lying down at 45 Neuro:  Alert and oriented 3. Speech normal HEENT:  Mallampati class III-IV Cardiovascular:  Irregularly irregular. Normal blood pressure Lungs:  Overall diminished due to obesity. Clear to auscultation no wheezes no crackles Abdomen:  Significant visceral obesity present. Soft Musculoskeletal:  Chronic 1-2 plus edema present Skin:  Intact in the exposed areas  LABS:  BMET  Recent Labs Lab 11/13/15 0928 11/14/15 0538  NA 138 138  K 4.1 4.5  CL 106 105  CO2 24 24  BUN 19 16  CREATININE 1.72* 1.70*  GLUCOSE 104* 96    Electrolytes  Recent Labs Lab 11/13/15 0928 11/14/15 0538  CALCIUM 9.7 9.9  MG 2.2  --     CBC  Recent Labs Lab 11/12/15 1151 11/13/15 0928  WBC 6.6 6.1  HGB 13.1 12.9*  HCT 40.9 41.6  PLT 125* 140*    Coag's No results for input(s): APTT, INR in the last 168 hours.  Sepsis Markers No results for input(s): LATICACIDVEN, PROCALCITON, O2SATVEN in the last 168  hours.  ABG No results for input(s): PHART, PCO2ART, PO2ART in the last 168 hours.  Liver Enzymes  Recent Labs Lab 11/14/15 0538  AST 20  ALT 22  ALKPHOS 103  BILITOT 1.5*  ALBUMIN 3.5    Cardiac Enzymes  Recent Labs Lab 11/13/15 1828 11/14/15 0104 11/14/15 0538  TROPONINI <0.03 <0.03 <0.03    Glucose No results for input(s): GLUCAP in the last 168 hours.  Imaging - personally visualized CT Ct Chest High Resolution  Result Date: 11/14/2015 CLINICAL DATA:  65 year old male inpatient with dyspnea, former smoker and history of pulmonary nodule. EXAM: CT CHEST WITHOUT CONTRAST TECHNIQUE: Multidetector CT imaging of the chest was performed following the standard protocol without intravenous contrast. High resolution imaging of the lungs, as well as inspiratory and expiratory imaging, was performed. COMPARISON:  02/08/2013 chest CT.  11/13/2015 chest radiograph. FINDINGS: Cardiovascular: Top-normal heart size. Trace pericardial effusion/thickening. Left main, left anterior descending, left circumflex and right coronary atherosclerosis. Atherosclerotic thoracic aorta. Ascending thoracic aortic aneurysm measuring 4.2 cm, previously 3.9 cm on 02/08/2013. Top-normal caliber main pulmonary artery (3.1 cm diameter). Mediastinum/Nodes: No discrete thyroid nodules. Unremarkable esophagus. No pathologically enlarged axillary, mediastinal or gross hilar lymph nodes, noting limited sensitivity for the detection of hilar adenopathy on this noncontrast study. Lungs/Pleura: No pneumothorax. No pleural effusion. Right middle lobe 6 mm subpleural pulmonary nodule (series 3/ image 98) is stable since 02/08/2013 using similar measurement technique, and is considered benign. Right lower lobe 5 mm subpleural pulmonary nodule (series 3/ image 125) is stable since 02/08/2013 and considered benign. Left major fissure 11 mm plaque (series 3/ image 99) is stable since 02/08/2013 and considered benign. No acute  consolidative airspace disease, lung masses or new significant solid pulmonary nodules. There is mild patchy centrilobular ground-glass micro nodularity in both lungs, upper lobe predominant, slightly increased. There is mild mosaic attenuation in the lungs, due to the mild patchy air trapping as seen on the expiration sequence. No significant regions of subpleural reticulation, traction bronchiectasis, parenchymal banding, architectural distortion or frank honeycombing. Upper abdomen: Unremarkable. Musculoskeletal: No aggressive appearing focal osseous lesions. Moderate thoracic spondylosis. Stable mixed lytic and sclerotic lesions in the T4 and T7 vertebral bodies, favor hemangiomas. Mild symmetric gynecomastia. IMPRESSION: 1. Mild patchy upper lung predominant centrilobular ground-glass micronodularity, slightly increased since 2014. Mild patchy air trapping. Per EPIC, the patient is a former smoker. As such, these findings raise the possibility of subacute hypersensitivity pneumonitis. 2. Scattered subpleural pulmonary nodules are all stable since 2014 and considered benign . 3. Aortic atherosclerosis. Ascending thoracic 4.2 cm aortic aneurysm, slightly increased since 2014. Recommend annual imaging followup by CTA or MRA. This recommendation follows 2010 ACCF/AHA/AATS/ACR/ASA/SCA/SCAI/SIR/STS/SVM Guidelines for the Diagnosis and Management of Patients with Thoracic Aortic Disease. Circulation. 2010; 121: [id number]. 4. Left main and 3 vessel coronary atherosclerosis. Electronically Signed   By: Ilona Sorrel M.D.   On: 11/14/2015 23:43     ASSESSMENT / PLAN:  PULMONARY A: Baseline heavy smoker in the past. 30ppd Sedentary because of chronic systolic heart failure and obesity.  24mm lung nodule - in 2013  Current - dyspnea, cough, wheeze - sub-acute   - responded to nebs - on ace inhibitor - at risk for ILD or emphysema due to above   - HRCT withuot emphysema but he did  have wheeze (on ace  inhibitor) and responded to nebs  - HRCT with NEW  bilateral UL GGO - ? Viral , ? HP  - stable lung nodules 2017  - Oveall Ddx - is upper airway dysfn, v asthma v ace inhibitor aggravated v viral UL pneumonitis v some combo of these. Doubt acute HP but will keep it in Ddx  P:   Await Full pulmonary function test DC ace inhibitor - ok for ARB - d/w Dr Caryl Comes at bedside xopenex prn for wheeze Early followup at pulm clinic  - will need to check FeNO fo asthma at followup (till then hold off ICS) Will need repeat HRCT 3-9 months      Dr. Brand Males, M.D., [facility].C.P Pulmonary and Critical Care Medicine Staff Physician [facility] Pager: [phone], If no answer or between  15:00h - 7:00h: call [phone]  11/15/2015 11:30 AM

## 2015-11-15 NOTE — Discharge Summary (Signed)
Discharge Summary    Patient ID: Larry [name],  MRN: [id number], DOB/AGE: 63-May-1952 63 y.o.  Admit date: 11/13/2015 Discharge date: 11/15/2015  Primary Care Provider: REESE,BETTI D Primary Cardiologist: Dr. Rayann Heman  Discharge Diagnoses    Principal Problem:   Atrial tachycardia [facility]) Active Problems:   Coronary atherosclerosis   Obstructive sleep apnea   Physical deconditioning   Peripheral arterial disease (HCC)   Chronic combined systolic and diastolic CHF, NYHA class 3 (HCC)   Cough   Lung nodule   Pulmonary infiltrate present on computed tomography   History of Present Illness     Larry [name] is a 65 y.o. male with past medical history of CAD (s/p RCA stent, DES to LAD x 2 in 2016), ICM, combined systolic and diastolic CHF (EF [id number] by echo in 2015), HTN, HLD, obesity, and atrial tachycardia who presented to [facility] ED on 11/13/2015 for evaluation of increased HR.   Was seen at the cancer center and noted to have an increased HR. Cardiology follow-up was arranged but on the day of presentation, he continued to have a worsening cough, therefore his wife brought him to the ER for evaluation. While in the ER, he was noted to be going in and out of atrial tachycardia with a HR in the 130s at times.  Labs showed a WBC of 6.1, Hgb 12.9, and platelets 140. Creatinine 1.72. BNP 9.2. Initial troponin negative. CXR showed no acute cardiopulmonary process.  Cardiology was contacted for admission.  Hospital Course     Consultants: Pulmonology  He was noted to have active wheezing, therefore his Coreg was switched to Metoprolol Tartrate and he was started on Verapamil 80mg  Q8H.   Pulmonology was consulted on 9/23 and they obtained a chest CT which showed mild patchy upper lung predominant centrilobular ground-glass micronodularity, slightly increased since 2014, scattered subpleural pulmonary nodules are all stable since 2014 and considered benign, aortic  atherosclerosis, ascending thoracic 4.2 cm aortic aneurysm,  and left main and 3 vessel coronary atherosclerosis.  Pulmonology recommended switching his ACE-I to an ARB. ACE-I was discontinued and addition of an ARB should be addressed at outpatient Cardiology follow-up. Will use Xopenex inhaler PRN and he has close outpatient Pulmonology follow-up scheduled.   Patient was last examined by Dr. Caryl Comes and deemed stable for discharge. Will switch Verapamil dosing from 80mg  Q8H to Verapamil CR 240mg  daily. He has Cardiology follow-up scheduled on 11/18/2015 at 10:00AM with Truitt Merle, NP.  ____________  Discharge Vitals Blood pressure 132/75, pulse 98, temperature 98.2 F (36.8 C), temperature source Oral, resp. rate 18, height 5\' 10"  (1.778 m), weight (!) 352 lb 14.4 oz (160.1 kg), SpO2 98 %.  Filed Weights   11/13/15 1747 11/14/15 0501 11/15/15 0500  Weight: (!) 353 lb 12.8 oz (160.5 kg) (!) 353 lb 4.8 oz (160.3 kg) (!) 352 lb 14.4 oz (160.1 kg)    Labs & Radiologic Studies     CBC  Recent Labs  11/13/15 0928  WBC 6.1  NEUTROABS 3.8  HGB 12.9*  HCT 41.6  MCV 91.2  PLT [id number]*   Basic Metabolic Panel  Recent Labs  11/13/15 0928 11/14/15 0538  NA 138 138  K 4.1 4.5  CL 106 105  CO2 24 24  GLUCOSE 104* 96  BUN 19 16  CREATININE 1.72* 1.70*  CALCIUM 9.7 9.9  MG 2.2  --    Liver Function Tests  Recent Labs  11/14/15 0538  AST 20  ALT  22  ALKPHOS 103  BILITOT 1.5*  PROT 7.3  ALBUMIN 3.5   No results for input(s): LIPASE, AMYLASE in the last 72 hours. Cardiac Enzymes  Recent Labs  11/13/15 1828 11/14/15 0104 11/14/15 0538  TROPONINI <0.03 <0.03 <0.03   BNP Invalid input(s): POCBNP D-Dimer No results for input(s): DDIMER in the last 72 hours. Hemoglobin A1C No results for input(s): HGBA1C in the last 72 hours. Fasting Lipid Panel No results for input(s): CHOL, HDL, LDLCALC, TRIG, CHOLHDL, LDLDIRECT in the last 72 hours. Thyroid Function  Tests  Recent Labs  11/13/15 1828  TSH 0.962    Ct Chest High Resolution  Result Date: 11/14/2015 CLINICAL DATA:  65 year old male inpatient with dyspnea, former smoker and history of pulmonary nodule. EXAM: CT CHEST WITHOUT CONTRAST TECHNIQUE: Multidetector CT imaging of the chest was performed following the standard protocol without intravenous contrast. High resolution imaging of the lungs, as well as inspiratory and expiratory imaging, was performed. COMPARISON:  02/08/2013 chest CT.  11/13/2015 chest radiograph. FINDINGS: Cardiovascular: Top-normal heart size. Trace pericardial effusion/thickening. Left main, left anterior descending, left circumflex and right coronary atherosclerosis. Atherosclerotic thoracic aorta. Ascending thoracic aortic aneurysm measuring 4.2 cm, previously 3.9 cm on 02/08/2013. Top-normal caliber main pulmonary artery (3.1 cm diameter). Mediastinum/Nodes: No discrete thyroid nodules. Unremarkable esophagus. No pathologically enlarged axillary, mediastinal or gross hilar lymph nodes, noting limited sensitivity for the detection of hilar adenopathy on this noncontrast study. Lungs/Pleura: No pneumothorax. No pleural effusion. Right middle lobe 6 mm subpleural pulmonary nodule (series 3/ image 98) is stable since 02/08/2013 using similar measurement technique, and is considered benign. Right lower lobe 5 mm subpleural pulmonary nodule (series 3/ image 125) is stable since 02/08/2013 and considered benign. Left major fissure 11 mm plaque (series 3/ image 99) is stable since 02/08/2013 and considered benign. No acute consolidative airspace disease, lung masses or new significant solid pulmonary nodules. There is mild patchy centrilobular ground-glass micro nodularity in both lungs, upper lobe predominant, slightly increased. There is mild mosaic attenuation in the lungs, due to the mild patchy air trapping as seen on the expiration sequence. No significant regions of subpleural  reticulation, traction bronchiectasis, parenchymal banding, architectural distortion or frank honeycombing. Upper abdomen: Unremarkable. Musculoskeletal: No aggressive appearing focal osseous lesions. Moderate thoracic spondylosis. Stable mixed lytic and sclerotic lesions in the T4 and T7 vertebral bodies, favor hemangiomas. Mild symmetric gynecomastia. IMPRESSION: 1. Mild patchy upper lung predominant centrilobular ground-glass micronodularity, slightly increased since 2014. Mild patchy air trapping. Per EPIC, the patient is a former smoker. As such, these findings raise the possibility of subacute hypersensitivity pneumonitis. 2. Scattered subpleural pulmonary nodules are all stable since 2014 and considered benign . 3. Aortic atherosclerosis. Ascending thoracic 4.2 cm aortic aneurysm, slightly increased since 2014. Recommend annual imaging followup by CTA or MRA. This recommendation follows 2010 ACCF/AHA/AATS/ACR/ASA/SCA/SCAI/SIR/STS/SVM Guidelines for the Diagnosis and Management of Patients with Thoracic Aortic Disease. Circulation. 2010; 121: [id number]. 4. Left main and 3 vessel coronary atherosclerosis. Electronically Signed   By: Ilona Sorrel M.D.   On: 11/14/2015 23:43   Dg Chest Portable 1 View  Result Date: 11/13/2015 CLINICAL DATA:  increased SOB x several days and tachycardia Hx: CHF, COPD, CKD, PVD EXAM: PORTABLE CHEST 1 VIEW COMPARISON:  09/30/2013 FINDINGS: Exam is lordotic. Normal mediastinum and cardiac silhouette. Normal pulmonary vasculature. No evidence of effusion, infiltrate, or pneumothorax. No acute bony abnormality. Low lung volumes IMPRESSION: No acute cardiopulmonary process.  Low lung volumes Electronically Signed   By: Nicole Kindred  Leonia Reeves M.D.   On: 11/13/2015 09:43    Diagnostic Studies/Procedures    None Obtained.  Disposition   Pt is being discharged home today in good condition.  Follow-up Plans & Appointments    Follow-up Information    Truitt Merle, NP Follow up  on 11/18/2015.   Specialties:  Nurse Practitioner, Interventional Cardiology, Cardiology, Radiology Why:  Cardiology Follow-Up on 9/27 at 10:00AM. Contact information: [place] [place] [address] [phone]          Discharge Instructions    Increase activity slowly    Complete by:  As directed       Discharge Medications     Medication List    STOP taking these medications   carvedilol 25 MG tablet Commonly known as:  COREG   ramipril 1.25 MG capsule Commonly known as:  ALTACE     TAKE these medications   aspirin EC 81 MG tablet Take 1 tablet (81 mg total) by mouth daily.   atorvastatin 40 MG tablet Commonly known as:  LIPITOR TAKE 1 TABLET BY MOUTH DAILY   clopidogrel 75 MG tablet Commonly known as:  PLAVIX TAKE 1 TABLET (75 MG TOTAL) BY MOUTH DAILY WITH BREAKFAST.   ferrous sulfate 325 (65 FE) MG tablet Take 1 tablet (325 mg total) by mouth daily with breakfast.   furosemide 40 MG tablet Commonly known as:  LASIX Take 1 tablet (40 mg total) by mouth daily.   Magnesium 500 MG Caps Take 500 mg by mouth daily.   metoprolol tartrate 25 MG tablet Commonly known as:  LOPRESSOR Take 1 tablet (25 mg total) by mouth 2 (two) times daily.   multivitamin with minerals tablet Take 1 tablet by mouth daily.   nitroGLYCERIN 0.4 MG SL tablet Commonly known as:  NITROSTAT Place 0.4 mg under the tongue every 5 (five) minutes as needed for chest pain.   pantoprazole 40 MG tablet Commonly known as:  PROTONIX Take 1 tablet (40 mg total) by mouth daily.   potassium chloride SA 20 MEQ tablet Commonly known as:  KLOR-CON M20 Take 1 tablet (20 mEq total) by mouth 2 (two) times daily.   sildenafil 100 MG tablet Commonly known as:  VIAGRA Take 0.5 tablets (50 mg total) by mouth daily as needed for erectile dysfunction.   simethicone 80 MG chewable tablet Commonly known as:  GAS-X Chew 1 tablet (80 mg total) by mouth 4 (four) times daily.    spironolactone 25 MG tablet Commonly known as:  ALDACTONE TAKE 1 TABLET (25 MG TOTAL) BY MOUTH DAILY.   verapamil 240 MG CR tablet Commonly known as:  CALAN-SR Take 1 tablet (240 mg total) by mouth daily.   XOPENEX HFA 45 MCG/ACT inhaler Generic drug:  levalbuterol INHALE 2 PUFFS INTO THE LUNGS EVERY 4 (FOUR) HOURS AS NEEDED FOR WHEEZING.        Allergies No Known Allergies   Outstanding Labs/Studies   BMET at follow-up appointment.  Duration of Discharge Encounter   Greater than 30 minutes including physician time.  Signed, Erma Heritage, PA-C 11/15/2015, 12:18 PM

## 2015-11-15 NOTE — Progress Notes (Signed)
Clarified with PA Bernerd Pho and Dr. Caryl Comes that PFT's will be done at outpatient follow with pulmonary.

## 2015-11-15 NOTE — Progress Notes (Signed)
Patient Name: Larry [name]      SUBJECTIVE: Admitted 9/22 with shortness of breath and cough and found to have recurring episodes of atrial tachycardia. He was also noted to be wheezing. This prompted a change in his beta blockers to more selective carvedilol--metoprolol and the addition of verapamil.[name] Kitchen  He is unaware of tachycardia. He's had no fever and his white count was normal. Troponins are negative 3 BNP was less than 10  He has known coronary artery disease modest depression of LV EF 40% 2016. He has a history of prior stents  1/15 during acute illness, develped TdP with QT >700 ; These records were reviewed; unfortunately EP was not involved, and an explanation for event is not forthcoming.  QT 4/16 was normal with controlled rate  Still SOB despite control of atrial tach  pulm asked to see yday and appreciate their input   Past Medical History:  Diagnosis Date  . Atrial tachycardia ([place])    managed on beta blocker therapy  . Childhood asthma    "went away after I was 16"  . Chronic combined systolic and diastolic CHF, NYHA class 3 (HCC)    has diastolic heart failure grade 1; EF is 45 to 50% per echo 05/2011; EF 41% by Myoview 2016  . CKD (chronic kidney disease) stage 3, GFR 30-59 ml/min   . Colon cancer ([place])    MSI high; IHC loss of MLH1 and PMS2; BRAF negative; Negative methylation  . COPD (chronic obstructive pulmonary disease) ([place])   . Coronary artery disease   . Family history of ovarian cancer   . Hypercholesteremia   . Hypertension   . Noncompliance   . NSVT (nonsustained ventricular tachycardia) (HCC)    beta blocker restarted  . Obesity   . OSA on CPAP    used nightly  . Peripheral arterial disease (HCC)    small ulcer the head of right metatarsal plantar surface right foot  . S/P colostomy ([place])    2014  . Thrombocytopenia ([place])   . Torsades de pointes ([place])    X 2 episodes during hospital visit 12'14"electrolyte imbalance"-  "Shocked"    Scheduled Meds:  Scheduled Meds: . aspirin EC  81 mg Oral Daily  . atorvastatin  40 mg Oral Daily  . clopidogrel  75 mg Oral Daily  . enoxaparin (LOVENOX) injection  40 mg Subcutaneous Q24H  . ferrous sulfate  325 mg Oral Q breakfast  . furosemide  20 mg Oral Daily  . magnesium oxide  400 mg Oral Daily  . metoprolol tartrate  25 mg Oral BID  . multivitamin with minerals  1 tablet Oral Daily  . pantoprazole  40 mg Oral Daily  . potassium chloride SA  20 mEq Oral BID  . ramipril  1.25 mg Oral Daily  . simethicone  80 mg Oral QID  . sodium chloride flush  3 mL Intravenous Q12H  . spironolactone  12.5 mg Oral BID  . verapamil  80 mg Oral Q8H   Continuous Infusions:  sodium chloride, acetaminophen, ALPRAZolam, levalbuterol, nitroGLYCERIN, sodium chloride flush, zolpidem    PHYSICAL EXAM Vitals:   11/14/15 2142 11/14/15 2336 11/15/15 0500 11/15/15 0512  BP: 122/82 115/69  132/75  Pulse: 89 83  98  Resp:      Temp:  98.2 F (36.8 C)  98.2 F (36.8 C)  TempSrc:  Oral  Oral  SpO2:  99%  98%  Weight:   (!) 352 lb  14.4 oz (160.1 kg)   Height:       Well developed and Morbidly obese  in mild respdistress HENT normal Neck supple with JVP-flat Decreased breathsounds and wheezing  Regular rate and rhythm, no murmurs or gallops Abd-soft with active BS No Clubbing cyanosis 1+ edema Skin-warm and dry A & Oriented  Grossly normal sensory and motor function   TELEMETRY: Reviewed telemetry pt in  Atrial tach but less overnight    Intake/Output Summary (Last 24 hours) at 11/15/15 0825 Last data filed at 11/15/15 0509  Gross per 24 hour  Intake                0 ml  Output             1350 ml  Net            -1350 ml    LABS: Basic Metabolic Panel:  Recent Labs Lab 11/13/15 0928 11/14/15 0538  NA 138 138  K 4.1 4.5  CL 106 105  CO2 24 24  GLUCOSE 104* 96  BUN 19 16  CREATININE 1.72* 1.70*  CALCIUM 9.7 9.9  MG 2.2  --    Cardiac Enzymes:  Recent  Labs  11/13/15 1828 11/14/15 0104 11/14/15 0538  TROPONINI <0.03 <0.03 <0.03   CBC:  Recent Labs Lab 11/12/15 1151 11/13/15 0928  WBC 6.6 6.1  NEUTROABS 3.9 3.8  HGB 13.1 12.9*  HCT 40.9 41.6  MCV 88.2 91.2  PLT 125* 140*   PROTIME: No results for input(s): LABPROT, INR in the last 72 hours. Liver Function Tests:  Recent Labs  11/14/15 0538  AST 20  ALT 22  ALKPHOS 103  BILITOT 1.5*  PROT 7.3  ALBUMIN 3.5   No results for input(s): LIPASE, AMYLASE in the last 72 hours. BNP: BNP (last 3 results)  Recent Labs  02/03/15 0945 11/13/15 0928  BNP 6.4 9.2    ProBNP (last 3 results) No results for input(s): PROBNP in the last 8760 hours.  D-Dimer: No results for input(s): DDIMER in the last 72 hours. Hemoglobin A1C: No results for input(s): HGBA1C in the last 72 hours. Fasting Lipid Panel: No results for input(s): CHOL, HDL, LDLCALC, TRIG, CHOLHDL, LDLDIRECT in the last 72 hours. Thyroid Function Tests:  Recent Labs  11/13/15 1828  TSH 0.962       ASSESSMENT AND PLAN:  Principal Problem:   Atrial tachycardia (HCC) Active Problems:   Coronary atherosclerosis   Obstructive sleep apnea   Physical deconditioning   Peripheral arterial disease (HCC) Torsades de pointes    Chronic combined systolic and diastolic CHF, NYHA class 3 (HCC) Renal insufficiency grade 3  review of old chart is daunting in terms of use of antiarrhythmics with CAD and TdP-- the only real option would prob be amio  But even that... So will try and push forward with Verapamil but might anticpate plan incl ablation as outpt  Right now verapamil is doing a great Job  apprec pulm input  CT suggestive of acute process and have been in touch withpulm re help with the acute component   And have recommended the use ofFITBIT WHEN he goes home to get some idea of the burden of his arrhythmia  Have also called pulm to see him as I suspect the lung is actually the driver of his problem  and the atrial tach which is long standing is secondary.  Signed, Virl Axe MD  11/15/2015

## 2015-11-17 ENCOUNTER — Encounter: Payer: Self-pay | Admitting: Nurse Practitioner

## 2015-11-18 ENCOUNTER — Encounter: Payer: Self-pay | Admitting: Nurse Practitioner

## 2015-11-18 ENCOUNTER — Ambulatory Visit (INDEPENDENT_AMBULATORY_CARE_PROVIDER_SITE_OTHER): Payer: Medicare Other | Admitting: Nurse Practitioner

## 2015-11-18 VITALS — BP 98/60 | HR 96 | Ht 70.5 in | Wt 353.1 lb

## 2015-11-18 DIAGNOSIS — I471 Supraventricular tachycardia: Secondary | ICD-10-CM

## 2015-11-18 DIAGNOSIS — I5022 Chronic systolic (congestive) heart failure: Secondary | ICD-10-CM | POA: Diagnosis not present

## 2015-11-18 DIAGNOSIS — I259 Chronic ischemic heart disease, unspecified: Secondary | ICD-10-CM

## 2015-11-18 NOTE — Patient Instructions (Addendum)
We will be checking the following labs today - NONE   Medication Instructions:    Continue with your current medicines.   You may try over the counter Omeprazole/Nexium in the place of the Protonix    Testing/Procedures To Be Arranged:  N/A  Follow-Up:   See me in one month with an EKG    Other Special Instructions:   N/A    If you need a refill on your cardiac medications before your next appointment, please call your pharmacy.   Call the [place] [facility] office at [phone] if you have any questions, problems or concerns.

## 2015-11-18 NOTE — Progress Notes (Signed)
CARDIOLOGY OFFICE NOTE  Date:  11/18/2015    Larry [name] Date of Birth: 1951-01-20 Medical Record #[id number]  PCP:  Kristine Garbe, MD  Cardiologist:  [facility]    Chief Complaint  Patient presents with  . Irregular Heart Beat    Work in visit - seen for Dr. Rayann Heman    History of Present Illness: Larry [name] is a 65 y.o. male who presents today for a work in visit. Seen for Dr. Rayann Heman.   He has an ischemic CM with severe diffuse 3VD and prior stents to the RCA in 6767, systolic and diastolic dysfunction, HTN, HLD and obesity. Has had a history of long standing atrial tach that has been treated with beta blocker. Was hospitalized back in April of 2013 with a HF exacerbation - had stopped his medicines. Last EF of 40 - to 20% with diastolic dysfunction as well noted in January of 2015.   Seen by me back in October of 2014 - Was out of his medicines again. Had lost weight and was anemic - was sending to GI and ended up having colon cancer.   He was admitted (02/02/2013 - 03/01/2013) to [facility] with subacute history of progressive weakness, constipation and difficulty eating and drinking. He presented with acute renal failure (Creatinine 7.83) and severe anemia (Hbg of 7.3) as well as obstruction. Colonoscopy doing this admission with biopsy revealed adenocarcinoma and patient underwent exploratory lap with partial colectomy with colostomy on 12/23 by Dr. Hulen Skains. Recovery was complicated by wound dehissance, requiring a return to the OR 12/29 with abdominal wound closure with VAC placment. After the procedure, the patient developed atrial tachycardia, so a dilt drip was started (12/29-1/2). On 1/4, the patient had a cardiac arrest, requiring defibrillation, and was subsequently transferred to the ICU. QT was found to be > 700, with mild hypokalemia and hypomagnesemia implicated as the cause. Lidocaine drip was started. Electrolyte abnormality treated and  2D-echo revealed an EF of 40-45% with periapical akinesis and biatrial enlargement. He experienced recurrent torsades on 01/05 and shocked in NSR. By 1/6, QTc had decreased to 499, lidocaine drip was d/c'ed, and patient was transferred out of ICU to stepdown. Then admitted to physical medicine and rehabilitation on 01/09 due to severe deconditioning. He was continued on protein supplement to promote wound healing. His abdominal wound was treated with VAC through 03/05/13. His heart rate was controlled without recurrent arrythmia and was maintained on potassium and magnesium supplements. He was discharged to home on 03/07/2013. He was last evaluated by Dr. Hulen Skains on 02/03 with review of his pathology demonstrating invasive adenocarcinoma of the transverse colon with penetration into the pericolonic fat. He had 0/18 lymph nodes negative for metastatic disease. He was pathologic stage [id number].   Saw Dr. Gwenlyn Found in early July of 2015 for a foot ulcer - basically normal ABI's done.   He was readmitted to the hospital in August of 2015 - triggered by too much salt. Presented with respiratory distress - treated with Bipap and quickly stabilized. Was diuresed about 6 liters as well. Started on low dose ACE. Sleep study recommended.  I have seen back here several times since that hospitalization - he had improved. Titrated as much medicine as BP could tolerate. Felt to be stable from our standpoint. Referred to Sleep Medicine/Dr. Annamaria Boots and had CPAP ordered for his OSA.   Seen in March of 2016 - wanting to get his colostomy reversed and needing pre op  clearance. He was feeling pretty good - no real cardiac symptoms noted. Did have a superficial leg ulcer and was being treated by the wound clinic and had seen Dr. Bridgett Larsson for evaluation as well. Had a Myoview to risk stratify - this was high risk and I referred him on for cardiac cath with Dr. Burt Knack. Underwent PCI to the LAD with DES x 2. Committed to [facility]AP[facility] for one year  ideally.  Seen by me back in December - was doing ok. Weight was continuing to climb. He was fine with holding off on colostomy reversal. Had gotten more and more sedentary. I last saw him back in May and he was stable but wanting to have his colostomy reversed.   Seen at the cancer center last week - HR elevated - asked to follow up here - I increased his Coreg further. He ended up going in to the hospital over the weekend - still tachycardic - switched over to daily dosing of Verapamil and changed Coreg to Lopressor - looks like his ACE was stopped - pulmonary wanted him on ARB which was to be considered as an outpatient. Seen by pulmonary for a cough which led to a CT scan - see below. HR in the 130's the whole time while in the ER.   Comes in today. Here alone. Has not even been on this new regimen for a full 3 days yet. He feels fine now. No more coughing. Not short of breath. Swelling is about the same. Weight is about the same. Not dizzy or lightheaded. Apparently to see Dr. Hulen Skains in November for discussion about reversal. He has never felt the fast HR. Sees pulmonary next month.   Past Medical History:  Diagnosis Date  . Atrial tachycardia ([place])    managed on beta blocker therapy  . Childhood asthma    "went away after I was 16"  . Chronic combined systolic and diastolic CHF, NYHA class 3 (HCC)    has diastolic heart failure grade 1; EF is 45 to 50% per echo 05/2011; EF 41% by Myoview 2016  . CKD (chronic kidney disease) stage 3, GFR 30-59 ml/min   . Colon cancer ([place])    MSI high; IHC loss of MLH1 and PMS2; BRAF negative; Negative methylation  . COPD (chronic obstructive pulmonary disease) ([place])   . Coronary artery disease   . Family history of ovarian cancer   . Hypercholesteremia   . Hypertension   . Noncompliance   . NSVT (nonsustained ventricular tachycardia) (HCC)    beta blocker restarted  . Obesity   . OSA on CPAP    used nightly  . Peripheral arterial disease (HCC)     small ulcer the head of right metatarsal plantar surface right foot  . S/P colostomy ([place])    2014  . Thrombocytopenia ([place])   . Torsades de pointes ([place])    X 2 episodes during hospital visit 12'14"electrolyte imbalance"- "Shocked"    Past Surgical History:  Procedure Laterality Date  . COLON SURGERY    . COLONOSCOPY N/A 02/08/2013   Procedure: COLONOSCOPY;  Surgeon: [name], MD;  Location: [facility];  Service: Endoscopy;  Laterality: N/A;  . COLONOSCOPY WITH PROPOFOL N/A 05/09/2014   Procedure: COLONOSCOPY WITH PROPOFOL;  Surgeon: [name], MD;  Location: [facility] ENDOSCOPY;  Service: Endoscopy;  Laterality: N/A;  . CORONARY ANGIOPLASTY WITH STENT PLACEMENT  11/12/2008; 06/11/2014   stent x 2 to RCA; stent x 2 to LAD  . CORONARY STENT PLACEMENT  06/11/2014   m-LAD 3.5 x 16 mm Synergy DES, d-LAD  2.25 x 16 mm Synergy DES  . ESOPHAGOGASTRODUODENOSCOPY N/A 02/08/2013   Procedure: ESOPHAGOGASTRODUODENOSCOPY (EGD);  Surgeon: [name], MD;  Location: [facility] ENDOSCOPY;  Service: Endoscopy;  Laterality: N/A;  . LAPAROTOMY N/A 02/12/2013   Procedure: EXPLORATORY LAPAROTOMY PARTIAL COLECTOMY WITH COLOSTOMY;  Surgeon: [name], MD;  Location: [place];  Service: General;  Laterality: N/A;  . LAPAROTOMY N/A 02/18/2013   Procedure: EXPLORATORY LAPAROTOMY/Closure of Wound;  Surgeon: [name], MD;  Location: [place];  Service: General;  Laterality: N/A;  . LEFT HEART CATHETERIZATION WITH CORONARY ANGIOGRAM N/A 06/11/2014   Procedure: LEFT HEART CATHETERIZATION WITH CORONARY ANGIOGRAM;  Surgeon: [name], MD; CFX calcified, 30-40 percent, RCA calcified, 40/50/40%, PDA diffuse disease, LAD 40/75/90% s/p DES 2      Medications: Current Outpatient Prescriptions  Medication Sig Dispense Refill  . aspirin EC 81 MG tablet Take 1 tablet (81 mg total) by mouth daily. 30 tablet 0  . atorvastatin (LIPITOR) 40 MG tablet TAKE 1 TABLET BY MOUTH DAILY 90 tablet 3  . clopidogrel (PLAVIX) 75  MG tablet TAKE 1 TABLET (75 MG TOTAL) BY MOUTH DAILY WITH BREAKFAST. 30 tablet 3  . ferrous sulfate 325 (65 FE) MG tablet Take 1 tablet (325 mg total) by mouth daily with breakfast. 30 tablet 1  . furosemide (LASIX) 40 MG tablet Take 1 tablet (40 mg total) by mouth daily. 90 tablet 3  . Magnesium 500 MG CAPS Take 500 mg by mouth daily.    . metoprolol tartrate (LOPRESSOR) 25 MG tablet Take 1 tablet (25 mg total) by mouth 2 (two) times daily. 60 tablet 6  . Multiple Vitamins-Minerals (MULTIVITAMIN WITH MINERALS) tablet Take 1 tablet by mouth daily.    . nitroGLYCERIN (NITROSTAT) 0.4 MG SL tablet Place 0.4 mg under the tongue every 5 (five) minutes as needed for chest pain.    . potassium chloride SA (KLOR-CON M20) 20 MEQ tablet Take 1 tablet (20 mEq total) by mouth 2 (two) times daily. 180 tablet 3  . sildenafil (VIAGRA) 100 MG tablet Take 0.5 tablets (50 mg total) by mouth daily as needed for erectile dysfunction. 10 tablet 3  . Simethicone 125 MG TABS Take 125 mg by mouth 4 (four) times daily.    [name] Kitchen spironolactone (ALDACTONE) 25 MG tablet TAKE 1 TABLET (25 MG TOTAL) BY MOUTH DAILY. 90 tablet 2  . verapamil (CALAN-SR) 240 MG CR tablet Take 1 tablet (240 mg total) by mouth daily. 30 tablet 6  . XOPENEX HFA 45 MCG/ACT inhaler INHALE 2 PUFFS INTO THE LUNGS EVERY 4 (FOUR) HOURS AS NEEDED FOR WHEEZING. 15 Inhaler 3   No current facility-administered medications for this visit.     Allergies: No Known Allergies  Social History: The patient  reports that he quit smoking about 7 years ago. His smoking use included Cigarettes. He has a 38.00 pack-year smoking history. He has never used smokeless tobacco. He reports that he drinks alcohol. He reports that he does not use drugs.   Family History: The patient's family history includes Dementia in his mother; Diabetes in his father; Heart disease in his father; Hypertension in his father and mother; Leukemia in his maternal uncle; Liver cancer in his  maternal grandmother; Parkinsonism in his mother; Prostate cancer in his maternal uncle.   Review of Systems: Please see the history of present illness.   Otherwise, the review of systems is positive for none.   All  other systems are reviewed and negative.   Physical Exam: VS:  BP 98/60   Pulse 96   Ht 5' 10.5" (1.791 m)   Wt (!) 353 lb 1.9 oz (160.2 kg)   BMI 49.95 kg/m  .  BMI Body mass index is 49.95 kg/m.  Wt Readings from Last 3 Encounters:  11/18/15 (!) 353 lb 1.9 oz (160.2 kg)  11/15/15 (!) 352 lb 14.4 oz (160.1 kg)  11/12/15 (!) 358 lb 4.8 oz (162.5 kg)    General: Pleasant. Morbidly obese black male who is alert and in no acute distress.   HEENT: Normal.  Neck: Supple, no JVD, carotid bruits, or masses noted.  Cardiac: Regular rate and rhythm. Heart tones are distant. Chronic edema.  Respiratory:  Lungs are clear to auscultation bilaterally with normal work of breathing.  GI: Obese. Soft and nontender.  MS: No deformity or atrophy. Gait and ROM intact.  Skin: Warm and dry. Color is normal.  Neuro:  Strength and sensation are intact and no gross focal deficits noted.  Psych: Alert, appropriate and with normal affect.   LABORATORY DATA:  EKG:  EKG is ordered today. This demonstrates sinus rhythm - his rate is down to 96.  Lab Results  Component Value Date   WBC 6.1 11/13/2015   HGB 12.9 (L) 11/13/2015   HCT 41.6 11/13/2015   PLT 140 (L) 11/13/2015   GLUCOSE 96 11/14/2015   CHOL 168 06/29/2015   TRIG 140 06/29/2015   HDL 47 06/29/2015   LDLCALC 93 06/29/2015   ALT 22 11/14/2015   AST 20 11/14/2015   NA 138 11/14/2015   K 4.5 11/14/2015   CL 105 11/14/2015   CREATININE 1.70 (H) 11/14/2015   BUN 16 11/14/2015   CO2 24 11/14/2015   TSH 0.962 11/13/2015   INR 1.0 06/09/2014   HGBA1C 6.0 11/26/2009    BNP (last 3 results)  Recent Labs  02/03/15 0945 11/13/15 0928  BNP 6.4 9.2    ProBNP (last 3 results) No results for input(s): PROBNP in the last  8760 hours.   Other Studies Reviewed Today:  CT CHEST 10/2015 IMPRESSION: 1. Mild patchy upper lung predominant centrilobular ground-glass micronodularity, slightly increased since 2014. Mild patchy air trapping. Per EPIC, the patient is a former smoker. As such, these findings raise the possibility of subacute hypersensitivity pneumonitis. 2. Scattered subpleural pulmonary nodules are all stable since 2014 and considered benign . 3. Aortic atherosclerosis. Ascending thoracic 4.2 cm aortic aneurysm, slightly increased since 2014. Recommend annual imaging followup by CTA or MRA   Procedure: Left Heart Cath, Selective Coronary Angiography, PTCA and stenting of the mid and distal LAD.  Coronary angiography: Left mainstem: The left mainstem is patent. The vessel divides into the LAD and left circumflex. There is no significant stenosis, but there is mild irregularity.  Left anterior descending (LAD): The LAD is moderately calcified. The vessel is severely diseased. The proximal LAD is patent with 30-40% stenosis after the second septal perforator. There is diffuse calcification. The diagonal branches are small. The mid LAD has an eccentric 75% stenosis at the origin of the second diagonal branch. Beyond that area the mid and distal LAD are diffusely diseased. There is another severe stenosis in the apical LAD of 90%.  Left circumflex (LCx): The left circumflex is calcified. The vessel is patent with mild irregularity. There are no high-grade stenoses identified. There are scattered 30-40% stenoses throughout the proximal and mid circumflex as well as the first OM branch.  Right coronary artery (RCA): The RCA is dominant. The vessel is heavily calcified. The stented segments in the mid and distal RCA are patent. The proximal vessel has 30-40% stenosis. The mid vessel has 30-40% stenosis. The distal vessel is patent with 50-60% stenosis involving the origin of the PDA. The PDA is diffusely  diseased.  Left ventriculography: Deferred because of chronic kidney disease. By nuclear scan the LVEF is 41%.  PCI Note: Following the diagnostic procedure, the decision was made to proceed with PCI of the mid and apical LAD. The right coronary artery had patent stents in the left circumflex had nonobstructive disease. The LAD is a diffusely diseased vessel and the mid and distal vessel would be a poor surgical targets. I felt that PCI was the best option in this patient with a high risk nuclear scan demonstrating anteroapical and anterolateral ischemia. He will require colostomy reversal, so I planned on treating him with Synergy drug-eluting stents which had a biodegradable polymer and potentially can allow for earlier interruption of dual antiplatelet therapy. The patient was loaded with Plavix 600 mg. Weight-based bivalirudin was given for anticoagulation. Once a therapeutic ACT was achieved, a 6 [place] XB LAD 3.5 cm guide catheter was inserted. A cougar coronary guidewire was used to cross the lesion in the apical LAD. The lesion was predilated with a 2.5 x 12 mm balloon. The same balloon was used to dilate the mid lesion. The apical lesion was then stented with a 2.25 x 16 mm Synergy DES. The stent was postdilated with a 2.5 mm noncompliant balloon. The mid lesion was then stented with a 3.5 x 16 mm Synergy DES. That stent was postdilated with a 3.75 mm noncompliant balloon. Following PCI, there was 0% residual stenosis and TIMI-3 flow. Final angiography confirmed an excellent result. The patient tolerated the procedure well. There were no immediate procedural complications. A TR band was used for radial hemostasis. The patient was transferred to the post catheterization recovery area for further monitoring.  PCI Data: Lesion 1: Vessel - LAD/Segment - distal/apical Percent Stenosis (pre) 90 TIMI-flow 3 Stent 2.25 x 16 mm Synergy DES Percent Stenosis (post) 0 TIMI-flow (post)  3  Lesion 2: Vessel - LAD/Segment - mid Percent Stenosis (pre) 75 TIMI-flow 3 Stent 3.5 x 16 mm Synergy DES Percent Stenosis (post) 0 TIMI-flow (post) 3  Estimated Blood Loss: Minimal  Final Conclusions:  1. Two-vessel coronary artery disease with continued patency of the stented segments in the right coronary artery and severe stenoses of the mid and apical LAD 2. Mild diffuse nonobstructive left circumflex stenosis 3. Successful PCI of the LAD using 2 drug-eluting stents   Recommendations:  Dual antiplatelet therapy for at least 6 months. Would be reasonable to consider interruption of aspirin and Plavix at 6 months for colostomy reversal.  [name] MD, [facility] 06/11/2014, 10:06 AM   Myoview Impression April 2016 Exercise Capacity: Lexiscan with no exercise. BP Response: Normal blood pressure response. Clinical Symptoms: There is dyspnea. ECG Impression: No significant ST segment change suggestive of ischemia. Comparison with Prior Nuclear Study: No images to compare  Overall Impression: High risk stress nuclear study large inferior wall infarct from apex to base. Also suggestion of apical, anterior and lateral wall ischemia.  LV Ejection Fraction: 41%. LV Wall Motion: Inferiior akinesis Consistent with possible 3 VD Consider cath before major surgery   [place]   Echo Study Conclusions from January 2015  - Left ventricle: The cavity size was mildly dilated. Wall thickness  was increased in a pattern of mild LVH. Systolic function was mildly to moderately reduced. The estimated ejection fraction was in the range of 40% to 45%. There was akinesis of the apical lateral, apical anterior, and apical septal segments. There was akinesis of the true apex. Prominent trabeculation at the apex but I do not think thrombus was present. Patient had Definity study recently to look at the apex and no thrombus was seen. Doppler parameters are  consistent with abnormal left ventricular relaxation (grade 1 diastolic dysfunction). - Aortic valve: Trileaflet; moderately calcified leaflets. There was no stenosis. - Mitral valve: Moderately calcified annulus. Mildly calcified leaflets . Trivial regurgitation. - Left atrium: The atrium was moderately dilated. - Right ventricle: The cavity size was normal. Systolic function was normal. - Right atrium: The atrium was mildly dilated. - Pulmonary arteries: No complete TR doppler jet so unable to estimate PA systolic pressure. - Systemic veins: IVC was not visualized. - Pericardium, extracardiac: A trivial pericardial effusion was identified. Impressions:  - Mildly dilated LV with mild LV hypertrophy. EF 40-45% with peri-apical akinesis. I do not think that LV apical thrombus is present. Normal RV size and systolic function. Biatrial enlargement.   Assessment / Plan:  1. CAD with prior PCI to the RCA - S/P cath with PCI to the LAD back in April of 2016 following high risk Myoview - has had DES x 2 to the LAD - on aspirin/Plavix. Doing well but really needs to work on lifestyle modification - this has been a chronic issue. I have left him on his current regimen. He will need repeat Myoview prior to reversal of his colostomy  2. Combined systolic and diastolic HF - seems to be stable. No longer on ACE - it was very low dose. BP too soft for ARB at this time and I have NOT started.   3. Colon cancer - followed by oncology - was hopeful to have his colostomy reversed. He tells me he is ok to wait the full 12 months and I agree with waiting a full 12 months - even then I think this will be challenging. If this were to take place, I would favor repeating his Myoview - should see improvement. He is to have a visit with Dr. Hulen Skains for discussion later this fall.   4. HTN - stable on present regimen. BP actually on the low side - he is totally asymptomatic. I have left him on his current  regimen. No longer on his ACE.   5. CKD - off ACE.   6. OSA - now on CPAP and followed by Dr. Annamaria Boots - has had abnormal chest CT and has had PFTs as well - seeing pulmonary next month. Would defer follow up to Dr. Annamaria Boots.   7. HLD - on statin therapy  8. Atrial tachycardia - chronic issue - now on long acting verapamil and metoprolol - has not even been on for a full 3 days yet - HR is better - would leave him on this regimen and see back at our regular visit - may be able to increase the beta blocker on return.   Current medicines are reviewed with the patient today.  The patient does not have concerns regarding medicines other than what has been noted above.  The following changes have been made:  See above.  Labs/ tests ordered today include:    Orders Placed This Encounter  Procedures  . EKG 12-Lead     Disposition:   FU  with me   Patient is agreeable to this plan and will call if any problems develop in the interim.   Signed: Burtis Junes, RN, ANP-C 11/18/2015 10:31 AM  [facility] [address] [place], [place]  [address] Phone: [phone] Fax: [phone]

## 2015-11-28 ENCOUNTER — Other Ambulatory Visit: Payer: Self-pay | Admitting: Internal Medicine

## 2015-11-30 NOTE — Telephone Encounter (Signed)
Will file in chart as: ramipril (ALTACE) 1.25 MG capsule The source prescription was discontinued on 11/15/2015 by Erma Heritage, PA for the following reason: Stop Taking at Discharge. TAKE ONE CAPSULE BY MOUTH EVERY 12 HOURS     Disp: 60 capsule Refills: 11   Class: Normal Start: 11/30/2015  Originally ordered: 2 years ago by Allie Bossier, MD Last refill: 11/02/2015

## 2015-12-07 DIAGNOSIS — I2510 Atherosclerotic heart disease of native coronary artery without angina pectoris: Secondary | ICD-10-CM | POA: Diagnosis not present

## 2015-12-07 DIAGNOSIS — Z85038 Personal history of other malignant neoplasm of large intestine: Secondary | ICD-10-CM | POA: Diagnosis not present

## 2015-12-07 DIAGNOSIS — G4733 Obstructive sleep apnea (adult) (pediatric): Secondary | ICD-10-CM | POA: Diagnosis not present

## 2015-12-08 ENCOUNTER — Ambulatory Visit (HOSPITAL_COMMUNITY): Admission: RE | Admit: 2015-12-08 | Payer: Medicare Other | Source: Ambulatory Visit | Admitting: Gastroenterology

## 2015-12-08 ENCOUNTER — Encounter (HOSPITAL_COMMUNITY): Payer: Self-pay | Admitting: *Deleted

## 2015-12-08 SURGERY — COLONOSCOPY WITH PROPOFOL
Anesthesia: Monitor Anesthesia Care

## 2015-12-16 ENCOUNTER — Telehealth: Payer: Self-pay | Admitting: *Deleted

## 2015-12-16 ENCOUNTER — Telehealth: Payer: Self-pay | Admitting: Nurse Practitioner

## 2015-12-16 NOTE — Telephone Encounter (Signed)
Receiving request to stop Plavix for colonoscopy. His stent was from 04/2014. He is over one year out. Ok to stop Plavix for 5 days.   Form faxed back to Dr. Benson Norway.  Burtis Junes, RN, [place] [address] [place], [place]  [address] [phone]

## 2015-12-16 NOTE — Telephone Encounter (Signed)
Sending surgical clearance paperwork to Dr. Benson Norway @ [phone] for pt to stop plavix 5 days for colonoscopy per Truitt Merle, NP.

## 2015-12-17 ENCOUNTER — Encounter: Payer: Self-pay | Admitting: Internal Medicine

## 2015-12-18 ENCOUNTER — Encounter: Payer: Self-pay | Admitting: Internal Medicine

## 2015-12-18 ENCOUNTER — Ambulatory Visit (INDEPENDENT_AMBULATORY_CARE_PROVIDER_SITE_OTHER): Payer: Medicare Other | Admitting: Internal Medicine

## 2015-12-18 DIAGNOSIS — I4891 Unspecified atrial fibrillation: Secondary | ICD-10-CM | POA: Diagnosis not present

## 2015-12-18 DIAGNOSIS — G4733 Obstructive sleep apnea (adult) (pediatric): Secondary | ICD-10-CM | POA: Diagnosis not present

## 2015-12-18 DIAGNOSIS — Z23 Encounter for immunization: Secondary | ICD-10-CM | POA: Diagnosis not present

## 2015-12-18 DIAGNOSIS — I259 Chronic ischemic heart disease, unspecified: Secondary | ICD-10-CM

## 2015-12-18 NOTE — Patient Instructions (Signed)
Order- [facility]- Patient needs new headgear please. Continue CPAP auto 10-20, mask of choice, humidifier, supplies, AirView    Dx OSA  Flu vax  Please call if we can help

## 2015-12-18 NOTE — Progress Notes (Signed)
03/18/14- 63 yoM Referred by Truitt Merle NP/ Cardiology; stops breathing at night when he is sleeping.  Sleep Study results. Epworth Score: 3 Followed by oncology for adenoCA colon He hopes for clearance for reversal of colostomy. NPSG- 12/04/13- AHI 24.6/ hr, CPAP to 14, weight  348 lbs Wife tells him he snores loudly. Admits some daytime tiredness. No sleep medicines or caffeine. No ENT surgery. Denies history of lung disease but has COPD and his problem list. Ischemic cardiomyopathy, hypertension, history of congestive heart failure. Bedtime between 11 and 12 midnight, sleep latency one half hour, waking 2 or 3 times before up between 7 and 8 AM. Weight gain 35 pounds in the past 2 years.  05/19/14- 25 yoM followed for OSA complicated by COPD,  Ischemic cardiomyopathy, hypertension, history of congestive heart failure, colon cancer FOLLOWS FOR states wearing cpap auto 10-20 every night for about 5-6 hours. No complaints voiced about machine. States pressure and equipment working fine.   09/18/14- 65 yoM followed for OSA complicated by COPD,  Ischemic cardiomyopathy, hypertension, history of congestive heart failure, colon cancer Reports Doing well with CPAP auto 10-20/Advanced - wear 6-8 hours each night, DME - [facility] Download confirms good compliance and control. Nasal pillows mask.  12/18/2015-65 year old male former smoker followed for OSA complicated by COPD, ischemic cardiomyopathy, hypertension, history CHF, colon cancer CPAP auto 10-20/Advanced FOLLOWS FOR: DME [facility]. Pt wears CPAP about 7 hours nightly; pressure works well for patient; pt will need new supplies ordered. DL attached. Reports feeling well today. Comfortable with CPAP. Needs new headgear.  ROS-see HPI Constitutional:   No-   weight loss, night sweats, fevers, chills, fatigue, lassitude. HEENT:   No-  headaches, difficulty swallowing, tooth/dental problems, sore throat,       No-  sneezing, itching, ear ache, nasal  congestion, post nasal drip,  CV:  No-   chest pain, orthopnea, PND, swelling in lower extremities, anasarca,                                                     dizziness, palpitations Resp: +  shortness of breath with exertion or at rest.              No-   productive cough,  No non-productive cough,  No- coughing up of blood.              No-   change in color of mucus.  No- wheezing.   Skin: No-   rash or lesions. GI:  No-   heartburn, indigestion, abdominal pain, nausea, vomiting,  GU:  MS:  No-   joint pain or swelling.   Neuro-     nothing unusual Psych:  No- change in mood or affect. No depression or anxiety.  No memory loss.  OBJ- Physical Exam  big man, + overweight General- Alert, Oriented, Affect-appropriate, Distress- none acute Skin- rash-none, lesions- none, excoriation- none Lymphadenopathy- none Head- atraumatic            Eyes- Gross vision intact, PERRLA, conjunctivae and secretions clear            Ears- Hearing, canals-normal            Nose- Clear, no-Septal dev, mucus, polyps, erosion, perforation             Throat- Mallampati III-IV , mucosa clear ,  drainage- none, tonsils- atrophic Neck- flexible , trachea midline, no stridor , thyroid nl, carotid no bruit Chest - symmetrical excursion , unlabored           Heart/CV- RRR , no murmur , no gallop  , no rub, nl s1 s2                           - JVD- none , edema- none, stasis changes- none, varices- none           Lung- clear to P&A, wheeze- none, cough- none , dullness-none, rub- none           Chest wall-  Abd- +right colostomy Br/ Gen/ Rectal- Not done, not indicated Extrem- cyanosis- none, clubbing, none, atrophy- none, strength- nl Neuro- grossly intact to observation

## 2015-12-19 ENCOUNTER — Other Ambulatory Visit: Payer: Self-pay | Admitting: Nurse Practitioner

## 2015-12-20 NOTE — Assessment & Plan Note (Signed)
Rhythm on exam today is regular very nearly so

## 2015-12-20 NOTE — Assessment & Plan Note (Signed)
Excellent compliance and control confirmed on download Plan-DME notified of need for replacement headgear.

## 2015-12-30 ENCOUNTER — Encounter (INDEPENDENT_AMBULATORY_CARE_PROVIDER_SITE_OTHER): Payer: Self-pay

## 2015-12-30 ENCOUNTER — Ambulatory Visit (INDEPENDENT_AMBULATORY_CARE_PROVIDER_SITE_OTHER): Payer: Medicare Other | Admitting: Nurse Practitioner

## 2015-12-30 ENCOUNTER — Encounter: Payer: Self-pay | Admitting: Nurse Practitioner

## 2015-12-30 VITALS — BP 112/70 | HR 101 | Ht 70.5 in | Wt 364.1 lb

## 2015-12-30 DIAGNOSIS — I259 Chronic ischemic heart disease, unspecified: Secondary | ICD-10-CM | POA: Diagnosis not present

## 2015-12-30 DIAGNOSIS — I471 Supraventricular tachycardia: Secondary | ICD-10-CM

## 2015-12-30 DIAGNOSIS — Z01818 Encounter for other preprocedural examination: Secondary | ICD-10-CM

## 2015-12-30 MED ORDER — METOPROLOL TARTRATE 25 MG PO TABS: 37.5000 mg | ORAL_TABLET | Freq: Two times a day (BID) | ORAL | 6 refills | Status: DC

## 2015-12-30 NOTE — Patient Instructions (Addendum)
We will be checking the following labs today - NONE   Medication Instructions:    Continue with your current medicines. BUT  I am going to increase the Lopressor (Metoprolol) to 37.5 mg twice a day - this will be a pill and a half twice a day - I have sent this to your drug store    Testing/Procedures To Be Arranged:   2 day Lexiscan - first week of December  Follow-Up:   See me tentatively in 4 months    Other Special Instructions:   N/A    If you need a refill on your cardiac medications before your next appointment, please call your pharmacy.   Call the [place] office at [phone] if you have any questions, problems or concerns.

## 2015-12-30 NOTE — Progress Notes (Signed)
CARDIOLOGY OFFICE NOTE  Date:  12/30/2015    Larry [name] Date of Birth: [birth year] Medical Record #[id number]  PCP:  Kristine Garbe, MD  Cardiologist:  Serv[facility]    Chief Complaint  Patient presents with  . Irregular Heart Beat    Follow up visit - seen for Dr. Rayann Heman    History of Present Illness: Larry [name] is a 65 y.o. male who presents today for a follow up visit. Seen for Dr. Rayann Heman.   He has an ischemic CM with severe diffuse 3VD and prior stents to the RCA in 7672, systolic and diastolic dysfunction, HTN, HLD and obesity. Has had a history of long standing atrial tach that has been treated with beta blocker. Was hospitalized back in April of 2013 with a HF exacerbation - had stopped his medicines. Last EF of 40 - to 09% with diastolic dysfunction as well noted in January of 2015.   Seen by me back in October of 2014 - Was out of his medicines again. Had lost weight and was anemic - was sending to GI and ended up having colon cancer.   He was admitted (02/02/2013 - 03/01/2013) to [facility] with subacute history of progressive weakness, constipation and difficulty eating and drinking. He presented with acute renal failure (Creatinine 7.83) and severe anemia (Hbg of 7.3) as well as obstruction. Colonoscopy doing this admission with biopsy revealed adenocarcinoma and patient underwent exploratory lap with partial colectomy with colostomy on 12/23 by Dr. Hulen Skains. Recovery was complicated by wound dehissance, requiring a return to the OR 12/29 with abdominal wound closure with VAC placment. After the procedure, the patient developed atrial tachycardia, so a dilt drip was started (12/29-1/2). On 1/4, the patient had a cardiac arrest, requiring defibrillation, and was subsequently transferred to the ICU. QT was found to be >700, with mild hypokalemia and hypomagnesemia implicated as the cause. Lidocaine drip was started. Electrolyte abnormality  treated and 2D-echo revealed an EF of 40-45% with periapical akinesis and biatrial enlargement. He experienced recurrent torsades on 01/05 and shocked in NSR. By 1/6, QTc had decreased to 499, lidocaine drip was d/c'ed, and patient was transferred out of ICU to stepdown. Then admitted to physical medicine and rehabilitation on 01/09 due to severe deconditioning. He was continued on protein supplement to promote wound healing. His abdominal wound was treated with VAC through 03/05/13. His heart rate was controlled without recurrent arrythmia and was maintained on potassium and magnesium supplements. He was discharged to home on 03/07/2013. He was last evaluated by Dr. Hulen Skains on 02/03 with review of his pathology demonstrating invasive adenocarcinoma of the transverse colon with penetration into the pericolonic fat. He had 0/18 lymph nodes negative for metastatic disease. He was pathologic stage [id number].   Saw Dr. Gwenlyn Found in early July of 2015 for a foot ulcer - basically normal ABI's done.   He was readmitted to the hospital in August of 2015 - triggered by too much salt. Presented with respiratory distress - treated with Bipap and quickly stabilized. Was diuresed about 6 liters as well. Started on low dose ACE. Sleep study recommended.  I have seen back here several times since that hospitalization - he had improved. Titrated as much medicine as BP could tolerate. Felt to be stable from our standpoint. Referred to Sleep Medicine/Dr. Annamaria Boots and had CPAP ordered for his OSA.   Seen in March of 2016 - wanting to get his colostomy reversed and needing pre op clearance.  He was feeling pretty good - no real cardiac symptoms noted. Did have a superficial leg ulcer and was being treated by the wound clinic and had seen Dr. Bridgett Larsson for evaluation as well. Had a Myoview to risk stratify - this was high risk and I referred him on for cardiac cath with Dr. Burt Knack. Underwent PCI to the LAD with DES x 2. Committed to [facility]APT  for one year ideally.  Seen by me back in December - was doing ok. Weight was continuing to climb. He was fine with holding off on colostomy reversal. Had gotten more and more sedentary. I last saw him back in May and he was stable but wanting to have his colostomy reversed.   Seen at the cancer center back in September - HR elevated - asked to follow up here - I increased his Coreg further. He ended up going in to the hospital for a weekend - still tachycardic - switched over to daily dosing of Verapamil and changed Coreg to Lopressor - looks like his ACE was stopped - pulmonary wanted him on ARB which was to be considered as an outpatient. Seen by pulmonary for a cough which led to a CT scan - see below. HR in the 130's the whole time while in the ER. I then saw him back like 3 days later - he was feeling fine. Not short of breath. No more cough.  To see Dr. Hulen Skains in November for discussion about reversal.  Comes in today. Here alone. He says he is doing well. No chest pain. Breathing is ok. Continues to gain weight - very sedentary. Swelling at baseline. No awareness of his heart rhythm. Not dizzy or lightheaded. He says he feels pretty good. Colonoscopy next Friday. Seeing Dr. Hulen Skains later this month to discuss colostomy reversal.   Past Medical History:  Diagnosis Date  . Atrial tachycardia ([place])    managed on beta blocker therapy  . Childhood asthma    "went away after I was 16"  . Chronic combined systolic and diastolic CHF, NYHA class 3 (HCC)    has diastolic heart failure grade 1; EF is 45 to 50% per echo 05/2011; EF 41% by Myoview 2016  . CKD (chronic kidney disease) stage 3, GFR 30-59 ml/min   . Colon cancer ([place]) 2015   MSI high; IHC loss of MLH1 and PMS2; BRAF negative; Negative methylation  . COPD (chronic obstructive pulmonary disease) ([place])   . Coronary artery disease   . Family history of ovarian cancer   . Hypercholesteremia   . Hypertension   . Noncompliance   . NSVT  (nonsustained ventricular tachycardia) (HCC)    beta blocker restarted  . Obesity   . OSA on CPAP    used nightly, pt does not know settings  . Peripheral arterial disease (HCC)    4.2 cm thoracic aortic aneurysm per chest ct 11-14-15 epic  . S/P colostomy ([place])    2014  . Thrombocytopenia ([place])   . Torsades de pointes ([place])    X 2 episodes during hospital visit 12'14"electrolyte imbalance"- "Shocked"    Past Surgical History:  Procedure Laterality Date  . COLON SURGERY    . COLONOSCOPY N/A 02/08/2013   Procedure: COLONOSCOPY;  Surgeon: [name], MD;  Location: [place];  Service: Endoscopy;  Laterality: N/A;  . COLONOSCOPY WITH PROPOFOL N/A 05/09/2014   Procedure: COLONOSCOPY WITH PROPOFOL;  Surgeon: [name], MD;  Location: [facility] ENDOSCOPY;  Service: Endoscopy;  Laterality: N/A;  .  CORONARY ANGIOPLASTY WITH STENT PLACEMENT  11/12/2008; 06/11/2014   stent x 2 to RCA; stent x 2 to LAD  . CORONARY STENT PLACEMENT  06/11/2014   m-LAD 3.5 x 16 mm Synergy DES, d-LAD  2.25 x 16 mm Synergy DES  . ESOPHAGOGASTRODUODENOSCOPY N/A 02/08/2013   Procedure: ESOPHAGOGASTRODUODENOSCOPY (EGD);  Surgeon: [name], MD;  Location: [facility] ENDOSCOPY;  Service: Endoscopy;  Laterality: N/A;  . LAPAROTOMY N/A 02/12/2013   Procedure: EXPLORATORY LAPAROTOMY PARTIAL COLECTOMY WITH COLOSTOMY;  Surgeon: [name], MD;  Location: [place];  Service: General;  Laterality: N/A;  . LAPAROTOMY N/A 02/18/2013   Procedure: EXPLORATORY LAPAROTOMY/Closure of Wound;  Surgeon: [name], MD;  Location: [place];  Service: General;  Laterality: N/A;  . LEFT HEART CATHETERIZATION WITH CORONARY ANGIOGRAM N/A 06/11/2014   Procedure: LEFT HEART CATHETERIZATION WITH CORONARY ANGIOGRAM;  Surgeon: [name], MD; CFX calcified, 30-40 percent, RCA calcified, 40/50/40%, PDA diffuse disease, LAD 40/75/90% s/p DES 2      Medications: Current Outpatient Prescriptions  Medication Sig Dispense Refill  . aspirin EC  81 MG tablet Take 1 tablet (81 mg total) by mouth daily. 30 tablet 0  . atorvastatin (LIPITOR) 40 MG tablet TAKE 1 TABLET BY MOUTH DAILY 90 tablet 3  . clopidogrel (PLAVIX) 75 MG tablet TAKE 1 TABLET (75 MG TOTAL) BY MOUTH DAILY WITH BREAKFAST. 30 tablet 3  . ferrous sulfate 325 (65 FE) MG tablet Take 1 tablet (325 mg total) by mouth daily with breakfast. 30 tablet 1  . furosemide (LASIX) 40 MG tablet Take 1 tablet (40 mg total) by mouth daily. 90 tablet 3  . levalbuterol (XOPENEX HFA) 45 MCG/ACT inhaler INHALE 2 PUFFS INTO THE LUNGS EVERY 4 (FOUR) HOURS AS NEEDED FOR WHEEZING. 15 Inhaler 3  . Magnesium 500 MG CAPS Take 500 mg by mouth daily.    . metoprolol tartrate (LOPRESSOR) 25 MG tablet Take 1.5 tablets (37.5 mg total) by mouth 2 (two) times daily. 90 tablet 6  . Multiple Vitamins-Minerals (MULTIVITAMIN WITH MINERALS) tablet Take 1 tablet by mouth daily.    . nitroGLYCERIN (NITROSTAT) 0.4 MG SL tablet Place 0.4 mg under the tongue every 5 (five) minutes as needed for chest pain.    . potassium chloride SA (KLOR-CON M20) 20 MEQ tablet Take 1 tablet (20 mEq total) by mouth 2 (two) times daily. 180 tablet 3  . sildenafil (VIAGRA) 100 MG tablet Take 0.5 tablets (50 mg total) by mouth daily as needed for erectile dysfunction. 10 tablet 3  . Simethicone 125 MG TABS Take 125 mg by mouth 4 (four) times daily.    [name] Kitchen spironolactone (ALDACTONE) 25 MG tablet TAKE 1 TABLET (25 MG TOTAL) BY MOUTH DAILY. 90 tablet 2  . verapamil (CALAN-SR) 240 MG CR tablet Take 1 tablet (240 mg total) by mouth daily. 30 tablet 6   No current facility-administered medications for this visit.     Allergies: No Known Allergies  Social History: The patient  reports that he quit smoking about 7 years ago. His smoking use included Cigarettes. He has a 38.00 pack-year smoking history. He has never used smokeless tobacco. He reports that he drinks alcohol. He reports that he does not use drugs.   Family History: The patient's  family history includes Dementia in his mother; Diabetes in his father; Heart disease in his father; Hypertension in his father and mother; Leukemia in his maternal uncle; Liver cancer in his maternal grandmother; Parkinsonism in his mother; Prostate cancer in his maternal  uncle.   Review of Systems: Please see the history of present illness.   Otherwise, the review of systems is positive for none.   All other systems are reviewed and negative.   Physical Exam: VS:  BP 112/70   Pulse (!) 101   Ht 5' 10.5" (1.791 m)   Wt (!) 364 lb 1.9 oz (165.2 kg)   BMI 51.51 kg/m  .  BMI Body mass index is 51.51 kg/m.  Wt Readings from Last 3 Encounters:  12/30/15 (!) 364 lb 1.9 oz (165.2 kg)  12/18/15 (!) 360 lb 12.8 oz (163.7 kg)  11/18/15 (!) 353 lb 1.9 oz (160.2 kg)    General: Pleasant. Well developed, well nourished and in no acute distress.   HEENT: Normal.  Neck: Supple, no JVD, carotid bruits, or masses noted.  Cardiac: Regular rate and rhythm. No murmurs, rubs, or gallops. No edema.  Respiratory:  Lungs are clear to auscultation bilaterally with normal work of breathing.  GI: Soft and nontender.  MS: No deformity or atrophy. Gait and ROM intact.  Skin: Warm and dry. Color is normal.  Neuro:  Strength and sensation are intact and no gross focal deficits noted.  Psych: Alert, appropriate and with normal affect.   LABORATORY DATA:  EKG:  EKG is ordered today. This demonstrates atrial tach - rate is 101 today.  Lab Results  Component Value Date   WBC 6.1 11/13/2015   HGB 12.9 (L) 11/13/2015   HCT 41.6 11/13/2015   PLT 140 (L) 11/13/2015   GLUCOSE 96 11/14/2015   CHOL 168 06/29/2015   TRIG 140 06/29/2015   HDL 47 06/29/2015   LDLCALC 93 06/29/2015   ALT 22 11/14/2015   AST 20 11/14/2015   NA 138 11/14/2015   K 4.5 11/14/2015   CL 105 11/14/2015   CREATININE 1.70 (H) 11/14/2015   BUN 16 11/14/2015   CO2 24 11/14/2015   TSH 0.962 11/13/2015   INR 1.0 06/09/2014   HGBA1C  6.0 11/26/2009    BNP (last 3 results)  Recent Labs  02/03/15 0945 11/13/15 0928  BNP 6.4 9.2    ProBNP (last 3 results) No results for input(s): PROBNP in the last 8760 hours.   Other Studies Reviewed Today:  CT CHEST 10/2015 IMPRESSION: 1. Mild patchy upper lung predominant centrilobular ground-glass micronodularity, slightly increased since 2014. Mild patchy air trapping. Per EPIC, the patient is a former smoker. As such, these findings raise the possibility of subacute hypersensitivity pneumonitis. 2. Scattered subpleural pulmonary nodules are all stable since 2014 and considered benign . 3. Aortic atherosclerosis. Ascending thoracic 4.2 cm aortic aneurysm, slightly increased since 2014. Recommend annual imaging followup by CTA or MRA   Procedure: Left Heart Cath, Selective Coronary Angiography, PTCA and stenting of the mid and distal LAD April 2016.  Coronary angiography: Left mainstem: The left mainstem is patent. The vessel divides into the LAD and left circumflex. There is no significant stenosis, but there is mild irregularity.  Left anterior descending (LAD): The LAD is moderately calcified. The vessel is severely diseased. The proximal LAD is patent with 30-40% stenosis after the second septal perforator. There is diffuse calcification. The diagonal branches are small. The mid LAD has an eccentric 75% stenosis at the origin of the second diagonal branch. Beyond that area the mid and distal LAD are diffusely diseased. There is another severe stenosis in the apical LAD of 90%.  Left circumflex (LCx): The left circumflex is calcified. The vessel is patent with mild  irregularity. There are no high-grade stenoses identified. There are scattered 30-40% stenoses throughout the proximal and mid circumflex as well as the first OM branch.  Right coronary artery (RCA): The RCA is dominant. The vessel is heavily calcified. The stented segments in the mid and distal RCA  are patent. The proximal vessel has 30-40% stenosis. The mid vessel has 30-40% stenosis. The distal vessel is patent with 50-60% stenosis involving the origin of the PDA. The PDA is diffusely diseased.  Left ventriculography: Deferred because of chronic kidney disease. By nuclear scan the LVEF is 41%.  PCI Note: Following the diagnostic procedure, the decision was made to proceed with PCI of the mid and apical LAD. The right coronary artery had patent stents in the left circumflex had nonobstructive disease. The LAD is a diffusely diseased vessel and the mid and distal vessel would be a poor surgical targets. I felt that PCI was the best option in this patient with a high risk nuclear scan demonstrating anteroapical and anterolateral ischemia. He will require colostomy reversal, so I planned on treating him with Synergy drug-eluting stents which had a biodegradable polymer and potentially can allow for earlier interruption of dual antiplatelet therapy. The patient was loaded with Plavix 600 mg. Weight-based bivalirudin was given for anticoagulation. Once a therapeutic ACT was achieved, a 6 [place] XB LAD 3.5 cm guide catheter was inserted. A cougar coronary guidewire was used to cross the lesion in the apical LAD. The lesion was predilated with a 2.5 x 12 mm balloon. The same balloon was used to dilate the mid lesion. The apical lesion was then stented with a 2.25 x 16 mm Synergy DES. The stent was postdilated with a 2.5 mm noncompliant balloon. The mid lesion was then stented with a 3.5 x 16 mm Synergy DES. That stent was postdilated with a 3.75 mm noncompliant balloon. Following PCI, there was 0% residual stenosis and TIMI-3 flow. Final angiography confirmed an excellent result. The patient tolerated the procedure well. There were no immediate procedural complications. A TR band was used for radial hemostasis. The patient was transferred to the post catheterization recovery area for further  monitoring.  PCI Data: Lesion 1: Vessel - LAD/Segment - distal/apical Percent Stenosis (pre) 90 TIMI-flow 3 Stent 2.25 x 16 mm Synergy DES Percent Stenosis (post) 0 TIMI-flow (post) 3  Lesion 2: Vessel - LAD/Segment - mid Percent Stenosis (pre) 75 TIMI-flow 3 Stent 3.5 x 16 mm Synergy DES Percent Stenosis (post) 0 TIMI-flow (post) 3  Estimated Blood Loss: Minimal  Final Conclusions:  1. Two-vessel coronary artery disease with continued patency of the stented segments in the right coronary artery and severe stenoses of the mid and apical LAD 2. Mild diffuse nonobstructive left circumflex stenosis 3. Successful PCI of the LAD using 2 drug-eluting stents   Recommendations:  Dual antiplatelet therapy for at least 6 months. Would be reasonable to consider interruption of aspirin and Plavix at 6 months for colostomy reversal.  [name] MD, [facility] 06/11/2014, 10:06 AM   Echo Study Conclusions from January 2015  - Left ventricle: The cavity size was mildly dilated. Wall thickness was increased in a pattern of mild LVH. Systolic function was mildly to moderately reduced. The estimated ejection fraction was in the range of 40% to 45%.There was akinesis of the apical lateral, apical anterior, and apical septal segments. There was akinesis of the true apex. Prominent trabeculation at the apex but I do not think thrombus was present. Patient had Definity study recently to  look at the apex and no thrombus was seen. Doppler parameters are consistent with abnormal left ventricular relaxation (grade 1 diastolic dysfunction). - Aortic valve: Trileaflet; moderately calcified leaflets. There was no stenosis. - Mitral valve: Moderately calcified annulus. Mildly calcified leaflets . Trivial regurgitation. - Left atrium: The atrium was moderately dilated. - Right ventricle: The cavity size was normal. Systolic function was normal. - Right atrium: The atrium was mildly  dilated. - Pulmonary arteries: No complete TR doppler jet so unable to estimate PA systolic pressure. - Systemic veins: IVC was not visualized. - Pericardium, extracardiac: A trivial pericardial effusion was identified. Impressions:  - Mildly dilated LV with mild LV hypertrophy. EF 40-45% with peri-apical akinesis. I do not think that LV apical thrombus is present. Normal RV size and systolic function. Biatrial enlargement.  Assessment / Plan: 1. CAD with prior PCI to the RCA - S/P cath with PCI to the LAD back in April of 2016 following high risk Myoview - has had DES x 2 to the LAD - on aspirin/Plavix. Doing well but really needs to work on lifestyle modification - this has been a chronic issue and I really do not see this changing. I have left him on his current regimen. He will need repeat Myoview prior to reversal of his colostomy.   2. Combined systolic and diastolic HF - seems to be stable. No longer on ACE - it was very low dose. BP has typically been too soft for ARB at this time and I have NOT started.   3. Colon cancer - followed by oncology - for colonoscopy next week - holding Plavix for 5 days. Seeing Dr. Hulen Skains later this month - will go ahead and arrange 2 day Lexiscan for the first week of December. If surgery is not going to be performed - will cancel this Myoview and continue with current medical management.   4. HTN - stable on present regimen. I have left him on his current regimen. No longer on his ACE.   5. CKD - off ACE.   6. OSA - now on CPAP and followed by Dr. Annamaria Boots  7. HLD - on statin therapy  8. Atrial tachycardia - chronic issue - now on long acting verapamil and metoprolol - will try to increase his beta blocker up some - hopefully BP will tolerate - he is totally asymptomatic.   Current medicines are reviewed with the patient today.  The patient does not have concerns regarding medicines other than what has been noted above.  The following  changes have been made:  See above.  Labs/ tests ordered today include:    Orders Placed This Encounter  Procedures  . Myocardial Perfusion Imaging  . EKG 12-Lead     Disposition:   FU with me tentatively in 3 to 4 months. Plan as above.   Patient is agreeable to this plan and will call if any problems develop in the interim.   Signed: Burtis Junes, RN, ANP-C 12/30/2015 11:10 AM  [facility] [address] [place], [place]  [address] Phone: [phone] Fax: [phone]

## 2015-12-31 ENCOUNTER — Other Ambulatory Visit: Payer: Self-pay | Admitting: Physician Assistant

## 2016-01-01 ENCOUNTER — Encounter (HOSPITAL_COMMUNITY): Payer: Self-pay | Admitting: *Deleted

## 2016-01-07 NOTE — H&P (Signed)
Larry [name]  HPI: The patient was diagnosed with a Stage IIc (pT4N0) colon cancer on 02/08/2013 for obstipation and severe dehydration. His last CEA level was 1.9 on 05/06/2015 and his last visit with Dr. Benay [name] was on 11/12/2015. See the note on 03/2014 for full details. His last colonoscopy was 04/2014 and the plan is for reanastamosis this November. The patient has MSH1 and PMS2 mutations. Additionally, he has severe three vessel CAD with chronic atrial tachycardia controlled with verapamil and metoprolol and OSA. His last cardiology evaluation was on 11/18/2015, but he was admitted from 11/13/2015-11/15/2015 for his atrial tachycardia. Dr. Benay [name] referred him back to Cardiology and he was hospitalized as he had uncontrolled coughing secondary to his atrial tachycardia.  Past Medical History:  Diagnosis Date  . Atrial tachycardia ([place])    managed on beta blocker therapy  . Childhood asthma    "went away after I was 16"  . Chronic combined systolic and diastolic CHF, NYHA class 3 (HCC)    has diastolic heart failure grade 1; EF is 45 to 50% per echo 05/2011; EF 41% by Myoview 2016  . CKD (chronic kidney disease) stage 3, GFR 30-59 ml/min   . Colon cancer ([place]) 2015   MSI high; IHC loss of MLH1 and PMS2; BRAF negative; Negative methylation  . COPD (chronic obstructive pulmonary disease) ([place])   . Coronary artery disease   . Family history of ovarian cancer   . Hypercholesteremia   . Hypertension   . Noncompliance   . NSVT (nonsustained ventricular tachycardia) (HCC)    beta blocker restarted  . Obesity   . OSA on CPAP    used nightly, pt does not know settings  . Peripheral arterial disease (HCC)    4.2 cm thoracic aortic aneurysm per chest ct 11-14-15 epic  . S/P colostomy ([place])    2014  . Thrombocytopenia ([place])   . Torsades de pointes ([place])    X 2 episodes during hospital visit 12'14"electrolyte imbalance"- "Shocked"    Past Surgical History:  Procedure Laterality Date  .  COLON SURGERY    . COLONOSCOPY N/A 02/08/2013   Procedure: COLONOSCOPY;  Surgeon: Larry Beams, MD;  Location: [facility];  Service: Endoscopy;  Laterality: N/A;  . COLONOSCOPY WITH PROPOFOL N/A 05/09/2014   Procedure: COLONOSCOPY WITH PROPOFOL;  Surgeon: Larry Ada, MD;  Location: [facility] ENDOSCOPY;  Service: Endoscopy;  Laterality: N/A;  . CORONARY ANGIOPLASTY WITH STENT PLACEMENT  11/12/2008; 06/11/2014   stent x 2 to RCA; stent x 2 to LAD  . CORONARY STENT PLACEMENT  06/11/2014   m-LAD 3.5 x 16 mm Synergy DES, d-LAD  2.25 x 16 mm Synergy DES  . ESOPHAGOGASTRODUODENOSCOPY N/A 02/08/2013   Procedure: ESOPHAGOGASTRODUODENOSCOPY (EGD);  Surgeon: Larry Beams, MD;  Location: [facility] ENDOSCOPY;  Service: Endoscopy;  Laterality: N/A;  . LAPAROTOMY N/A 02/12/2013   Procedure: EXPLORATORY LAPAROTOMY PARTIAL COLECTOMY WITH COLOSTOMY;  Surgeon: Larry Ober, MD;  Location: [facility];  Service: General;  Laterality: N/A;  . LAPAROTOMY N/A 02/18/2013   Procedure: EXPLORATORY LAPAROTOMY/Closure of Wound;  Surgeon: Larry Ok, MD;  Location: [facility];  Service: General;  Laterality: N/A;  . LEFT HEART CATHETERIZATION WITH CORONARY ANGIOGRAM N/A 06/11/2014   Procedure: LEFT HEART CATHETERIZATION WITH CORONARY ANGIOGRAM;  Surgeon: Larry Mocha, MD; CFX calcified, 30-40 percent, RCA calcified, 40/50/40%, PDA diffuse disease, LAD 40/75/90% s/p DES 2     Family History  Problem Relation Age of Onset  . Hypertension Father   . Heart  disease Father     before age 59  . Diabetes Father   . Hypertension Mother   . Dementia Mother   . Parkinsonism Mother   . Liver cancer Maternal Grandmother     dx in her 46s  . Prostate cancer Maternal Uncle   . Leukemia Maternal Uncle     Social History:  reports that he quit smoking about 7 years ago. His smoking use included Cigarettes. He has a 38.00 pack-year smoking history. He has never used smokeless tobacco. He reports that he drinks alcohol. He reports that he  does not use drugs.  Allergies: No Known Allergies  Medications: Scheduled: Continuous:  No results found for this or any previous visit (from the past 24 hour(s)).   No results found.  ROS:  As stated above in the HPI otherwise negative.  There were no vitals taken for this visit.    PE: Gen: NAD, Alert and Oriented HEENT:  Larry [name]/AT, EOMI Neck: Supple, no LAD Lungs: CTA Bilaterally CV: RRR without M/G/R ABM: Soft, NTND, +BS, ostomy Ext: No C/C/E  Assessment/Plan: 1) Personal history of colon cancer - Colonoscopy through the ostomy and Hartman's pouch.  Larry [name] D 01/07/2016, 7:45 AM

## 2016-01-08 ENCOUNTER — Encounter (HOSPITAL_COMMUNITY): Payer: Self-pay

## 2016-01-08 ENCOUNTER — Ambulatory Visit (HOSPITAL_COMMUNITY): Payer: Medicare Other | Admitting: Anesthesiology

## 2016-01-08 ENCOUNTER — Ambulatory Visit (HOSPITAL_COMMUNITY)
Admission: RE | Admit: 2016-01-08 | Discharge: 2016-01-08 | Disposition: A | Discharge status: Home / Self Care | Payer: Medicare Other | Source: Ambulatory Visit | Attending: Gastroenterology | Admitting: Gastroenterology

## 2016-01-08 ENCOUNTER — Encounter (HOSPITAL_COMMUNITY)
Admission: RE | Disposition: A | Discharge status: Home / Self Care | Payer: Self-pay | Source: Ambulatory Visit | Attending: Gastroenterology

## 2016-01-08 DIAGNOSIS — Z1211 Encounter for screening for malignant neoplasm of colon: Secondary | ICD-10-CM | POA: Diagnosis not present

## 2016-01-08 DIAGNOSIS — Z85038 Personal history of other malignant neoplasm of large intestine: Secondary | ICD-10-CM | POA: Diagnosis not present

## 2016-01-08 DIAGNOSIS — Z980 Intestinal bypass and anastomosis status: Secondary | ICD-10-CM | POA: Insufficient documentation

## 2016-01-08 DIAGNOSIS — E7800 Pure hypercholesterolemia, unspecified: Secondary | ICD-10-CM | POA: Insufficient documentation

## 2016-01-08 DIAGNOSIS — I739 Peripheral vascular disease, unspecified: Secondary | ICD-10-CM | POA: Diagnosis not present

## 2016-01-08 DIAGNOSIS — Z6841 Body Mass Index (BMI) 40.0 and over, adult: Secondary | ICD-10-CM | POA: Diagnosis not present

## 2016-01-08 DIAGNOSIS — I10 Essential (primary) hypertension: Secondary | ICD-10-CM | POA: Diagnosis not present

## 2016-01-08 DIAGNOSIS — I2510 Atherosclerotic heart disease of native coronary artery without angina pectoris: Secondary | ICD-10-CM | POA: Insufficient documentation

## 2016-01-08 DIAGNOSIS — I130 Hypertensive heart and chronic kidney disease with heart failure and stage 1 through stage 4 chronic kidney disease, or unspecified chronic kidney disease: Secondary | ICD-10-CM | POA: Diagnosis not present

## 2016-01-08 DIAGNOSIS — J45909 Unspecified asthma, uncomplicated: Secondary | ICD-10-CM | POA: Insufficient documentation

## 2016-01-08 DIAGNOSIS — K635 Polyp of colon: Secondary | ICD-10-CM | POA: Diagnosis not present

## 2016-01-08 DIAGNOSIS — Z79899 Other long term (current) drug therapy: Secondary | ICD-10-CM | POA: Diagnosis not present

## 2016-01-08 DIAGNOSIS — K5730 Diverticulosis of large intestine without perforation or abscess without bleeding: Secondary | ICD-10-CM | POA: Insufficient documentation

## 2016-01-08 DIAGNOSIS — I5042 Chronic combined systolic (congestive) and diastolic (congestive) heart failure: Secondary | ICD-10-CM | POA: Diagnosis not present

## 2016-01-08 DIAGNOSIS — D124 Benign neoplasm of descending colon: Secondary | ICD-10-CM | POA: Diagnosis not present

## 2016-01-08 DIAGNOSIS — J449 Chronic obstructive pulmonary disease, unspecified: Secondary | ICD-10-CM | POA: Diagnosis not present

## 2016-01-08 DIAGNOSIS — Z7982 Long term (current) use of aspirin: Secondary | ICD-10-CM | POA: Insufficient documentation

## 2016-01-08 DIAGNOSIS — N183 Chronic kidney disease, stage 3 (moderate): Secondary | ICD-10-CM | POA: Insufficient documentation

## 2016-01-08 DIAGNOSIS — G4733 Obstructive sleep apnea (adult) (pediatric): Secondary | ICD-10-CM | POA: Insufficient documentation

## 2016-01-08 DIAGNOSIS — E669 Obesity, unspecified: Secondary | ICD-10-CM | POA: Diagnosis not present

## 2016-01-08 DIAGNOSIS — I471 Supraventricular tachycardia: Secondary | ICD-10-CM | POA: Diagnosis not present

## 2016-01-08 DIAGNOSIS — Z955 Presence of coronary angioplasty implant and graft: Secondary | ICD-10-CM | POA: Diagnosis not present

## 2016-01-08 DIAGNOSIS — Z87891 Personal history of nicotine dependence: Secondary | ICD-10-CM | POA: Diagnosis not present

## 2016-01-08 DIAGNOSIS — Z1509 Genetic susceptibility to other malignant neoplasm: Secondary | ICD-10-CM | POA: Diagnosis not present

## 2016-01-08 DIAGNOSIS — D122 Benign neoplasm of ascending colon: Secondary | ICD-10-CM | POA: Diagnosis not present

## 2016-01-08 DIAGNOSIS — Z8041 Family history of malignant neoplasm of ovary: Secondary | ICD-10-CM | POA: Insufficient documentation

## 2016-01-08 HISTORY — PX: COLONOSCOPY WITH PROPOFOL: SHX5780

## 2016-01-08 SURGERY — COLONOSCOPY WITH PROPOFOL
Anesthesia: Monitor Anesthesia Care

## 2016-01-08 MED ORDER — PROPOFOL 10 MG/ML IV BOLUS
INTRAVENOUS | Status: AC
  Filled 2016-01-08: qty 60

## 2016-01-08 MED ORDER — PROPOFOL 500 MG/50ML IV EMUL
INTRAVENOUS | Status: DC | PRN
  Administered 2016-01-08 (×2): 30 mg via INTRAVENOUS

## 2016-01-08 MED ORDER — PROPOFOL 10 MG/ML IV BOLUS
INTRAVENOUS | Status: AC
Start: 2016-01-08 — End: 2016-01-08
  Filled 2016-01-08: qty 40

## 2016-01-08 MED ORDER — SODIUM CHLORIDE 0.9 % IV SOLN
INTRAVENOUS | Status: DC
  Administered 2016-01-08: 09:00:00 via INTRAVENOUS

## 2016-01-08 MED ORDER — PROPOFOL 500 MG/50ML IV EMUL
INTRAVENOUS | Status: DC | PRN
  Administered 2016-01-08: 75 ug/kg/min via INTRAVENOUS

## 2016-01-08 SURGICAL SUPPLY — 21 items

## 2016-01-08 NOTE — Discharge Instructions (Signed)

## 2016-01-08 NOTE — Op Note (Signed)
[facility] Patient Name: Larry [name] Procedure Date: 01/08/2016 MRN: [id number] Attending MD: [name] , MD Date of Birth: January 10, 1951 CSN: [id number] Age: 65 Admit Type: Outpatient Procedure:                Colonoscopy Indications:              High risk colon cancer surveillance: Personal                            history of colon cancer Providers:                [name], MD, Dustin Flock RN, RN, Elspeth Cho Tech., Technician, Herbie Drape, CRNA Referring MD:              Medicines:                Propofol per Anesthesia Complications:            No immediate complications. Estimated Blood Loss:     Estimated blood loss was minimal. Procedure:                Pre-Anesthesia Assessment:                           - Prior to the procedure, a History and Physical                            was performed, and patient medications and                            allergies were reviewed. The patient's tolerance of                            previous anesthesia was also reviewed. The risks                            and benefits of the procedure and the sedation                            options and risks were discussed with the patient.                            All questions were answered, and informed consent                            was obtained. Prior Anticoagulants: The patient has                            taken no previous anticoagulant or antiplatelet                            agents. ASA Grade Assessment: III - A patient with  severe systemic disease. After reviewing the risks                            and benefits, the patient was deemed in                            satisfactory condition to undergo the procedure.                           - Sedation was administered by an anesthesia                            professional. Deep sedation was attained.                           After  obtaining informed consent, the colonoscope                            was passed under direct vision. Throughout the                            procedure, the patient's blood pressure, pulse, and                            oxygen saturations were monitored continuously. The                            [id number]) scope was introduced through                            the anus and advanced to the the cecum, identified                            by appendiceal orifice and ileocecal valve. The                            colonoscopy was performed with difficulty due to                            inadequate bowel prep. Successful completion of the                            procedure was aided by lavage. The patient                            tolerated the procedure well. The quality of the                            bowel preparation was good and poor. The ileocecal                            valve, appendiceal orifice, and rectum were  photographed. The quality of the bowel preparation                            was good and poor. The ileocecal valve, appendiceal                            orifice, and rectum were photographed. Scope In: 10:22:44 AM Scope Out: 10:48:44 AM Scope Withdrawal Time: 0 hours 10 minutes 54 seconds  Total Procedure Duration: 0 hours 26 minutes 0 seconds  Findings:      Three sessile polyps were found in the descending colon and ascending       colon. The polyps were 2 to 3 mm in size. These polyps were removed with       a cold snare. Resection and retrieval were complete.      Scattered small and large-mouthed diverticula were found in the sigmoid       colon and descending colon.      The colonoscope was insertd through the anus and advanced to the distal       transverse/proximal descending colon. Multiple large mucus balls were       found throughout the Hartman's pouch and it precluded adeqaute       visualization. During  several of the attempts with examination the mucus       coated the lens and visualization was very difficult despite withdrawing       the colonoscope to clear the lens. Through the ostomy, the cecum was       reached with ease, however, insufflation was incredibly difficult as the       air was escaping through the ostomy. Applying pressure to the ostomy       improved the insufflation, but it was still difficult to visualize.       Three small ascending colon polyps were found and two out of the three       were captured and removed. Attempts to capture the third small polyp       failed as adequate insufflation was not possible. Impression:               - Preparation of the colon was poor.                           - Three 2 to 3 mm polyps in the descending colon                            and in the ascending colon, removed with a cold                            snare. Resected and retrieved.                           - Diverticulosis in the sigmoid colon and in the                            descending colon. Moderate Sedation:      N/A- Per Anesthesia Care Recommendation:           - Patient has a contact number available for  emergencies. The signs and symptoms of potential                            delayed complications were discussed with the                            patient. Return to normal activities tomorrow.                            Written discharge instructions were provided to the                            patient.                           - Resume previous diet.                           - Continue present medications.                           - Await pathology results.                           - Repeat colonoscopy in 2 years for surveillance.                           - Perform a flexible sigmoidoscopy because the                            preparation was too poor in 2-4 weeks. Procedure Code(s):        --- Professional ---                            [id number], Colonoscopy, flexible; with removal of                            tumor(s), polyp(s), or other lesion(s) by snare                            technique Diagnosis Code(s):        --- Professional ---                           D12.4, Benign neoplasm of descending colon                           Z85.038, Personal history of other malignant                            neoplasm of large intestine                           D12.2, Benign neoplasm of ascending colon                           K57.30, Diverticulosis  of large intestine without                            perforation or abscess without bleeding CPT copyright 2016 [employer]. All rights reserved. The codes documented in this report are preliminary and upon coder review may  be revised to meet current compliance requirements. [name], MD [name], MD 01/08/2016 10:59:53 AM This report has been signed electronically. Number of Addenda: 0

## 2016-01-08 NOTE — Transfer of Care (Signed)
Immediate Anesthesia Transfer of Care Note  Patient: Larry [name]  Procedure(s) Performed: Procedure(s): COLONOSCOPY WITH PROPOFOL (N/A)  Patient Location:  Last Vitals:  Vitals:   01/08/16 0908  BP: (!) 148/85  Pulse: 94  Resp: 18  Temp: 37 C    Last Pain:  Vitals:   01/08/16 0908  TempSrc: Oral

## 2016-01-08 NOTE — Transfer of Care (Signed)
Immediate Anesthesia Transfer of Care Note  Patient: Larry [name]  Procedure(s) Performed: Procedure(s): COLONOSCOPY WITH PROPOFOL (N/A)  Patient Location: PACU  Anesthesia Type:MAC  Level of Consciousness: sedated, patient cooperative and responds to stimulation  Airway & Oxygen Therapy: Patient Spontanous Breathing and Patient connected to face mask oxygen  Post-op Assessment: Report given to RN and Post -op Vital signs reviewed and stable  Post vital signs: Reviewed and stable  Last Vitals:  Vitals:   01/08/16 0908  BP: (!) 148/85  Pulse: 94  Resp: 18  Temp: 37 C    Last Pain:  Vitals:   01/08/16 0908  TempSrc: Oral         Complications: No apparent anesthesia complications

## 2016-01-08 NOTE — Anesthesia Postprocedure Evaluation (Signed)
Anesthesia Post Note  Patient: Larry [name]  Procedure(s) Performed: Procedure(s) (LRB): COLONOSCOPY WITH PROPOFOL (N/A)  Patient location during evaluation: PACU Anesthesia Type: MAC Level of consciousness: awake and alert Pain management: pain level controlled Vital Signs Assessment: post-procedure vital signs reviewed and stable Respiratory status: spontaneous breathing, nonlabored ventilation and respiratory function stable Cardiovascular status: stable and blood pressure returned to baseline Anesthetic complications: no    Last Vitals:  Vitals:   01/08/16 1130 01/08/16 1140  BP: 121/78 135/84  Pulse: 79 81  Resp: 15 13  Temp:      Last Pain:  Vitals:   01/08/16 0908  TempSrc: Oral                 Larry [name],W. EDMOND

## 2016-01-08 NOTE — Anesthesia Preprocedure Evaluation (Addendum)
Anesthesia Evaluation  Patient identified by MRN, date of birth, ID band Patient awake    Reviewed: Allergy & Precautions, H&P , NPO status , Patient's Chart, lab work & pertinent test results, reviewed documented beta blocker date and time   Airway Mallampati: III  TM Distance: >3 FB Neck ROM: Full    Dental no notable dental hx. (+) Teeth Intact, Dental Advisory Given   Pulmonary neg pulmonary ROS, asthma , sleep apnea and Continuous Positive Airway Pressure Ventilation , COPD,  COPD inhaler, former smoker,    Pulmonary exam normal breath sounds clear to auscultation       Cardiovascular hypertension, Pt. on medications and Pt. on home beta blockers + CAD, + Cardiac Stents, + Peripheral Vascular Disease and +CHF  negative cardio ROS   Rhythm:Regular Rate:Normal     Neuro/Psych negative neurological ROS  negative psych ROS   GI/Hepatic negative GI ROS, Neg liver ROS,   Endo/Other  negative endocrine ROS  Renal/GU Renal InsufficiencyRenal diseasenegative Renal ROS  negative genitourinary   Musculoskeletal   Abdominal   Peds  Hematology negative hematology ROS (+) anemia ,   Anesthesia Other Findings   Reproductive/Obstetrics negative OB ROS                            Anesthesia Physical Anesthesia Plan  ASA: III  Anesthesia Plan: MAC   Post-op Pain Management:    Induction: Intravenous  Airway Management Planned: Nasal Cannula  Additional Equipment:   Intra-op Plan:   Post-operative Plan:   Informed Consent: I have reviewed the patients History and Physical, chart, labs and discussed the procedure including the risks, benefits and alternatives for the proposed anesthesia with the patient or authorized representative who has indicated his/her understanding and acceptance.   Dental advisory given  Plan Discussed with: CRNA  Anesthesia Plan Comments:         Anesthesia  Quick Evaluation

## 2016-01-11 ENCOUNTER — Encounter (HOSPITAL_COMMUNITY): Payer: Self-pay | Admitting: Gastroenterology

## 2016-01-19 DIAGNOSIS — Z933 Colostomy status: Secondary | ICD-10-CM | POA: Diagnosis not present

## 2016-01-20 ENCOUNTER — Telehealth (HOSPITAL_COMMUNITY): Payer: Self-pay | Admitting: *Deleted

## 2016-01-20 NOTE — Telephone Encounter (Signed)
Patient given detailed instructions per Myocardial Perfusion Study Information Sheet for the test on 01/25/16 Patient notified to arrive 15 minutes early and that it is imperative to arrive on time for appointment to keep from having the test rescheduled.  If you need to cancel or reschedule your appointment, please call the office within 24 hours of your appointment. Failure to do so may result in a cancellation of your appointment, and a $50 no show fee. Patient verbalized understanding. Larry [name]    

## 2016-01-25 ENCOUNTER — Other Ambulatory Visit: Payer: Self-pay | Admitting: Gastroenterology

## 2016-01-25 ENCOUNTER — Other Ambulatory Visit: Payer: Self-pay | Admitting: *Deleted

## 2016-01-25 ENCOUNTER — Encounter: Payer: Self-pay | Admitting: Family

## 2016-01-25 ENCOUNTER — Ambulatory Visit (HOSPITAL_COMMUNITY): Payer: Medicare Other | Attending: Cardiology

## 2016-01-25 DIAGNOSIS — I259 Chronic ischemic heart disease, unspecified: Secondary | ICD-10-CM | POA: Diagnosis not present

## 2016-01-25 DIAGNOSIS — Z01818 Encounter for other preprocedural examination: Secondary | ICD-10-CM | POA: Diagnosis not present

## 2016-01-25 DIAGNOSIS — I739 Peripheral vascular disease, unspecified: Secondary | ICD-10-CM

## 2016-01-25 MED ORDER — REGADENOSON 0.4 MG/5ML IV SOLN
0.4000 mg | Freq: Once | INTRAVENOUS | Status: AC
  Administered 2016-01-25: 0.4 mg via INTRAVENOUS

## 2016-01-25 MED ORDER — TECHNETIUM TC 99M TETROFOSMIN IV KIT
32.7000 | PACK | Freq: Once | INTRAVENOUS | Status: AC | PRN
  Administered 2016-01-25: 32.7 via INTRAVENOUS
  Filled 2016-01-25: qty 33

## 2016-01-26 ENCOUNTER — Ambulatory Visit (HOSPITAL_COMMUNITY): Payer: Medicare Other | Attending: Cardiology

## 2016-01-26 LAB — MYOCARDIAL PERFUSION IMAGING
LV dias vol: 193 mL (ref 62–150)
LV sys vol: 81 mL
Peak HR: 100 {beats}/min
RATE: 0.4
Rest HR: 84 {beats}/min
SDS: 5
SRS: 7
SSS: 12
TID: 0.79

## 2016-01-26 MED ORDER — TECHNETIUM TC 99M TETROFOSMIN IV KIT
33.0000 | PACK | Freq: Once | INTRAVENOUS | Status: AC | PRN
  Administered 2016-01-26: 33 via INTRAVENOUS
  Filled 2016-01-26: qty 33

## 2016-01-29 ENCOUNTER — Ambulatory Visit (INDEPENDENT_AMBULATORY_CARE_PROVIDER_SITE_OTHER): Payer: Medicare Other | Admitting: Family

## 2016-01-29 ENCOUNTER — Ambulatory Visit (HOSPITAL_COMMUNITY)
Admission: RE | Admit: 2016-01-29 | Discharge: 2016-01-29 | Disposition: A | Discharge status: Home / Self Care | Payer: Medicare Other | Source: Ambulatory Visit | Attending: Vascular Surgery | Admitting: Vascular Surgery

## 2016-01-29 ENCOUNTER — Encounter: Payer: Self-pay | Admitting: Family

## 2016-01-29 VITALS — BP 122/77 | HR 79 | Temp 97.4°F | Resp 18 | Ht 70.0 in | Wt 366.0 lb

## 2016-01-29 DIAGNOSIS — Z87891 Personal history of nicotine dependence: Secondary | ICD-10-CM | POA: Diagnosis not present

## 2016-01-29 DIAGNOSIS — I259 Chronic ischemic heart disease, unspecified: Secondary | ICD-10-CM | POA: Diagnosis not present

## 2016-01-29 DIAGNOSIS — I872 Venous insufficiency (chronic) (peripheral): Secondary | ICD-10-CM

## 2016-01-29 DIAGNOSIS — I739 Peripheral vascular disease, unspecified: Secondary | ICD-10-CM

## 2016-01-29 DIAGNOSIS — I779 Disorder of arteries and arterioles, unspecified: Secondary | ICD-10-CM | POA: Diagnosis not present

## 2016-01-29 NOTE — Progress Notes (Signed)
[facility] OF [place]   CC: Follow up peripheral artery occlusive disease  History of Present Illness Larry [name] is a 65 y.o. male patient of Dr. Imogene Burn who returns for follow up of his PAD and and history of chronic venous stasis ulcer on both lower legs which have healed. The wounds were located on the lateral aspect of both lower legs, above the lateral malleoli.  He denies any other non-healing wounds. He denies any pain in his legs or feet.  He has been elevating his legs most of the time when he is not walking and he notes that the swelling in his lower legs has decreased a great deal.  He was being treated at the wound care center at [facility]. The patient reports onset of leg swelling following his heart stents in 2010. The patient is not diabetic. He is able to ambulate without assistance but does not ambulate much by choice. He lives at home with his wife. He denies any history of claudication (he does not ambulate enough to elicit claudication however) or rest pain.   He has a complex past medical history including ischemic cardiomyopathy with severe three vessel disease s/p stents to the RCA in 2010 by Dr. Clifton James, combined systolic and diastolic HF, colon cancer s/p partial colectomy with colostomy, atrial tachycardia, history of cardiac arrest, obstructive sleep apnea, now on CPAP, CKD. hypertension and hyperlipidemia.  He is a former smoker.   His bilateral lower legs ulcers healed with 6 weeks of weekly Hydrogel dressing changes at the wound care center near [facility]. He wears compression hose sometimes, thinks the hose are too tight though.  Pt Diabetic: No Pt smoker: former smoker, quit in 2010, started at age 63 years  Pt meds include: Statin :Yes Betablocker: Yes ASA: Yes Other anticoagulants/antiplatelets: no   Past Medical History:  Diagnosis Date  . Atrial tachycardia (HCC)    managed on beta blocker therapy  .  Childhood asthma    "went away after I was 16"  . Chronic combined systolic and diastolic CHF, NYHA class 3 (HCC)    has diastolic heart failure grade 1; EF is 45 to 50% per echo 05/2011; EF 41% by Myoview 2016  . CKD (chronic kidney disease) stage 3, GFR 30-59 ml/min   . Colon cancer (HCC) 2015   MSI high; IHC loss of MLH1 and PMS2; BRAF negative; Negative methylation  . COPD (chronic obstructive pulmonary disease) (HCC)   . Coronary artery disease   . Family history of ovarian cancer   . Hypercholesteremia   . Hypertension   . Noncompliance   . NSVT (nonsustained ventricular tachycardia) (HCC)    beta blocker restarted  . Obesity   . OSA on CPAP    used nightly, pt does not know settings  . Peripheral arterial disease (HCC)    4.2 cm thoracic aortic aneurysm per chest ct 11-14-15 epic  . S/P colostomy (HCC)    2014  . Thrombocytopenia (HCC)   . Torsades de pointes (HCC)    X 2 episodes during hospital visit 12'14"electrolyte imbalance"- "Shocked"    Social History Social History  Substance Use Topics  . Smoking status: Former Smoker    Packs/day: 1.00    Years: 38.00    Types: Cigarettes    Quit date: 07/15/2008  . Smokeless tobacco: Never Used  . Alcohol use 0.0 oz/week     Comment: 06/11/2014 "I rarely have a drink; last drink was New Years;  if I wanted a drink I would"    Family History Family History  Problem Relation Age of Onset  . Hypertension Father   . Heart disease Father     before age 77  . Diabetes Father   . Hypertension Mother   . Dementia Mother   . Parkinsonism Mother   . Liver cancer Maternal Grandmother     dx in her 54s  . Prostate cancer Maternal Uncle   . Leukemia Maternal Uncle     Past Surgical History:  Procedure Laterality Date  . COLON SURGERY    . COLONOSCOPY N/A 02/08/2013   Procedure: COLONOSCOPY;  Surgeon: [name], MD;  Location: [place];  Service: Endoscopy;  Laterality: N/A;  . COLONOSCOPY WITH PROPOFOL N/A  05/09/2014   Procedure: COLONOSCOPY WITH PROPOFOL;  Surgeon: [name], MD;  Location: [facility] ENDOSCOPY;  Service: Endoscopy;  Laterality: N/A;  . COLONOSCOPY WITH PROPOFOL N/A 01/08/2016   Procedure: COLONOSCOPY WITH PROPOFOL;  Surgeon: [name], MD;  Location: [facility];  Service: Endoscopy;  Laterality: N/A;  . CORONARY ANGIOPLASTY WITH STENT PLACEMENT  11/12/2008; 06/11/2014   stent x 2 to RCA; stent x 2 to LAD  . CORONARY STENT PLACEMENT  06/11/2014   m-LAD 3.5 x 16 mm Synergy DES, d-LAD  2.25 x 16 mm Synergy DES  . ESOPHAGOGASTRODUODENOSCOPY N/A 02/08/2013   Procedure: ESOPHAGOGASTRODUODENOSCOPY (EGD);  Surgeon: [name], MD;  Location: [facility] ENDOSCOPY;  Service: Endoscopy;  Laterality: N/A;  . LAPAROTOMY N/A 02/12/2013   Procedure: EXPLORATORY LAPAROTOMY PARTIAL COLECTOMY WITH COLOSTOMY;  Surgeon: [name], MD;  Location: [place];  Service: General;  Laterality: N/A;  . LAPAROTOMY N/A 02/18/2013   Procedure: EXPLORATORY LAPAROTOMY/Closure of Wound;  Surgeon: [name], MD;  Location: [place];  Service: General;  Laterality: N/A;  . LEFT HEART CATHETERIZATION WITH CORONARY ANGIOGRAM N/A 06/11/2014   Procedure: LEFT HEART CATHETERIZATION WITH CORONARY ANGIOGRAM;  Surgeon: [name], MD; CFX calcified, 30-40 percent, RCA calcified, 40/50/40%, PDA diffuse disease, LAD 40/75/90% s/p DES 2     No Known Allergies  Current Outpatient Prescriptions  Medication Sig Dispense Refill  . aspirin EC 81 MG tablet Take 1 tablet (81 mg total) by mouth daily. 30 tablet 0  . atorvastatin (LIPITOR) 40 MG tablet TAKE 1 TABLET BY MOUTH DAILY 90 tablet 3  . clopidogrel (PLAVIX) 75 MG tablet TAKE 1 TABLET (75 MG TOTAL) BY MOUTH DAILY WITH BREAKFAST. 90 tablet 3  . ferrous sulfate 325 (65 FE) MG tablet Take 1 tablet (325 mg total) by mouth daily with breakfast. 30 tablet 1  . furosemide (LASIX) 40 MG tablet Take 1 tablet (40 mg total) by mouth daily. 90 tablet 3  . levalbuterol (XOPENEX  HFA) 45 MCG/ACT inhaler INHALE 2 PUFFS INTO THE LUNGS EVERY 4 (FOUR) HOURS AS NEEDED FOR WHEEZING. 15 Inhaler 3  . Magnesium 500 MG CAPS Take 500 mg by mouth daily.    . metoprolol tartrate (LOPRESSOR) 25 MG tablet Take 1.5 tablets (37.5 mg total) by mouth 2 (two) times daily. 90 tablet 6  . Multiple Vitamins-Minerals (MULTIVITAMIN WITH MINERALS) tablet Take 1 tablet by mouth daily.    . nitroGLYCERIN (NITROSTAT) 0.4 MG SL tablet Place 0.4 mg under the tongue every 5 (five) minutes as needed for chest pain.    . potassium chloride SA (KLOR-CON M20) 20 MEQ tablet Take 1 tablet (20 mEq total) by mouth 2 (two) times daily. 180 tablet 3  . sildenafil (VIAGRA) 100 MG tablet  Take 0.5 tablets (50 mg total) by mouth daily as needed for erectile dysfunction. 10 tablet 3  . Simethicone 125 MG TABS Take 125 mg by mouth 4 (four) times daily.    [name] Kitchen spironolactone (ALDACTONE) 25 MG tablet TAKE 1 TABLET (25 MG TOTAL) BY MOUTH DAILY. 90 tablet 2  . verapamil (CALAN-SR) 240 MG CR tablet Take 1 tablet (240 mg total) by mouth daily. 30 tablet 6   No current facility-administered medications for this visit.     ROS: See HPI for pertinent positives and negatives.   Physical Examination  Vitals:   01/29/16 1356  BP: 122/77  Pulse: 79  Resp: 18  Temp: 97.4 F (36.3 C)  TempSrc: Oral  SpO2: 97%  Weight: (!) 366 lb (166 kg)  Height: '5\' 10"'$  (1.778 m)   Body mass index is 52.52 kg/m.  General:A&O x 3, WD morbidly obese male in NAD  Head: [place]/AT  Pulmonary: Distant breath sounds due to habitus, respirations are non labored, no rales, rhonchi, or wheezing detected.  Cardiac: Distant heart sounds due to habitus, RRR, no detected murmur, no carotid bruits.   Vascular: Pulse exam difficult due to habitus.  Vessel Right Left  Radial Palpable Palpable  Aorta Not palpable N/A  Femoral Not palpable Not palpable  Popliteal Not palpable Not palpable  PT Not palpable  Not palpable  DP Not palpable Not palpable   Gastrointestinal: soft, non tender, obese, colostomy appliance to right abdomen.   Musculoskeletal: M/S 5/5 throughout. Less edema to lower legs bilaterally compared to previous visit. Extremities without ischemic changes. Venous stasis ulcers of both lower legs have healed. No cellulitis, no drainage, no odor. Venous stasis changes to lower legs bilaterally. No wounds on feet.   Neurologic: CN 2-12 intact. Pain and light touch intact in extremities. Motor exam as listed above  Psychiatric: Judgment intact, Mood & affect appropriate for pt's clinical situation  Dermatologic: See M/S exam for extremity exam, no rashes otherwise noted    Non-Invasive Vascular Imaging: DATE: 01/29/2016 ABI:  RIGHT: 1.33 ([place], 07-24-15), Waveforms: bi (PT) and monophasic (DP); TBI: 0.64   LEFT: 1.14 ([place]), Waveforms: biphasic; TBI: 0.65   ASSESSMENT: Larry [name] is a 65 y.o. male has bilateral chronic venous stasis; the venous ulcers on his lower legs have healed with treatment at the wound care center at [facility]. He has been adequately elevating his legs to minimize edema, and the edema in his lower legs is significantly less than previous visit. He has no non healing wounds nor signs of ischemia in his feet/legs.  He does have venous stasis changes in the skin (leathery) of his lower legs and feet.  He has multiple medical problems.  His atherosclerotic risk factors include CAD, CKD, former smoker, and extreme morbid obesity.   ABI's have improved from non compressible bilaterally with all monophasic waveforms to 1.33 right PT with biphasic waveforms (DP is monophasic), 1.14 left with biphasic PT and DP. Last TBI's were dampened and not measurable; today TBI's are almost normal at 0.64 and 0.65. The most likely reason for improvement in his arterial perfusion is that he has embarked on a graduated walking program.   PLAN:  Graduated walking  program.  Continue elevation of feet above his heart as much as possible when he is not walking. Based on the patient's vascular studies and examination, pt will return to clinic in 1 year with ABI's. I advised him to notify us if he develops concerns re the circulation in  his legs/feet.   I discussed in depth with the patient the nature of atherosclerosis, and emphasized the importance of maximal medical management including strict control of blood pressure, blood glucose, and lipid levels, obtaining regular exercise, and continued cessation of smoking.  The patient is aware that without maximal medical management the underlying atherosclerotic disease process will progress, limiting the benefit of any interventions.  The patient was given information about PAD including signs, symptoms, treatment, what symptoms should prompt the patient to seek immediate medical care, and risk reduction measures to take.  Clemon Chambers, RN, MSN, FNP-C [facility] [employer] Phone: [phone]  Clinic MD: Bridgett Larsson  01/29/16 2:05 PM

## 2016-01-29 NOTE — Patient Instructions (Signed)
Peripheral Vascular Disease Peripheral vascular disease (PVD) is a disease of the blood vessels that are not part of your heart and brain. A simple term for PVD is poor circulation. In most cases, PVD narrows the blood vessels that carry blood from your heart to the rest of your body. This can result in a decreased supply of blood to your arms, legs, and internal organs, like your stomach or kidneys. However, it most often affects a person's lower legs and feet. There are two types of PVD.  Organic PVD. This is the more common type. It is caused by damage to the structure of blood vessels.  Functional PVD. This is caused by conditions that make blood vessels contract and tighten (spasm). Without treatment, PVD tends to get worse over time. PVD can also lead to acute ischemic limb. This is when an arm or limb suddenly has trouble getting enough blood. This is a medical emergency. Follow these instructions at home:  Take medicines only as told by your doctor.  Do not use any tobacco products, including cigarettes, chewing tobacco, or electronic cigarettes. If you need help quitting, ask your doctor.  Lose weight if you are overweight, and maintain a healthy weight as told by your doctor.  Eat a diet that is low in fat and cholesterol. If you need help, ask your doctor.  Exercise regularly. Ask your doctor for some good activities for you.  Take good care of your feet.  Wear comfortable shoes that fit well.  Check your feet often for any cuts or sores. Contact a doctor if:  You have cramps in your legs while walking.  You have leg pain when you are at rest.  You have coldness in a leg or foot.  Your skin changes.  You are unable to get or have an erection (erectile dysfunction).  You have cuts or sores on your feet that are not healing. Get help right away if:  Your arm or leg turns cold and blue.  Your arms or legs become red, warm, swollen, painful, or numb.  You have  chest pain or trouble breathing.  You suddenly have weakness in your face, arm, or leg.  You become very confused or you cannot speak.  You suddenly have a very bad headache.  You suddenly cannot see. This information is not intended to replace advice given to you by your health care provider. Make sure you discuss any questions you have with your health care provider. Document Released: 05/04/2009 Document Revised: 07/16/2015 Document Reviewed: 07/18/2013 Elsevier Interactive Patient Education  2017 [place].      Venous Stasis or Chronic Venous Insufficiency Chronic venous insufficiency, also called venous stasis, is a condition that affects the veins in the legs. The condition prevents blood from being pumped through these veins effectively. Blood may no longer be pumped effectively from the legs back to the heart. This condition can range from mild to severe. With proper treatment, you should be able to continue with an active life. CAUSES  Chronic venous insufficiency occurs when the vein walls become stretched, weakened, or damaged or when valves within the vein are damaged. Some common causes of this include:  High blood pressure inside the veins (venous hypertension).  Increased blood pressure in the leg veins from long periods of sitting or standing.  A blood clot that blocks blood flow in a vein (deep vein thrombosis).  Inflammation of a superficial vein (phlebitis) that causes a blood clot to form. RISK FACTORS Various  things can make you more likely to develop chronic venous insufficiency, including:  Family history of this condition.  Obesity.  Pregnancy.  Sedentary lifestyle.  Smoking.  Jobs requiring long periods of standing or sitting in one place.  Being a certain age. Women in their 66s and 94s and men in their 71s are more likely to develop this condition. SIGNS AND SYMPTOMS  Symptoms may include:   Varicose veins.  Skin breakdown or  ulcers.  Reddened or discolored skin on the leg.  Brown, smooth, tight, and painful skin just above the ankle, usually on the inside surface (lipodermatosclerosis).  Swelling. DIAGNOSIS  To diagnose this condition, your health care provider will take a medical history and do a physical exam. The following tests may be ordered to confirm the diagnosis:  Duplex ultrasound-A procedure that produces a picture of a blood vessel and nearby organs and also provides information on blood flow through the blood vessel.  Plethysmography-A procedure that tests blood flow.  A venogram, or venography-A procedure used to look at the veins using X-ray and dye. TREATMENT The goals of treatment are to help you return to an active life and to minimize pain or disability. Treatment will depend on the severity of the condition. Medical procedures may be needed for severe cases. Treatment options may include:   Use of compression stockings. These can help with symptoms and lower the chances of the problem getting worse, but they do not cure the problem.  Sclerotherapy-A procedure involving an injection of a material that "dissolves" the damaged veins. Other veins in the network of blood vessels take over the function of the damaged veins.  Surgery to remove the vein or cut off blood flow through the vein (vein stripping or laser ablation surgery).  Surgery to repair a valve. HOME CARE INSTRUCTIONS   Wear compression stockings as directed by your health care provider.  Only take over-the-counter or prescription medicines for pain, discomfort, or fever as directed by your health care provider.  Follow up with your health care provider as directed. SEEK MEDICAL CARE IF:   You have redness, swelling, or increasing pain in the affected area.  You see a red streak or line that extends up or down from the affected area.  You have a breakdown or loss of skin in the affected area, even if the breakdown is  small.  You have an injury to the affected area. SEEK IMMEDIATE MEDICAL CARE IF:   You have an injury and open wound in the affected area.  Your pain is severe and does not improve with medicine.  You have sudden numbness or weakness in the foot or ankle below the affected area, or you have trouble moving your foot or ankle.  You have a fever or persistent symptoms for more than 2-3 days.  You have a fever and your symptoms suddenly get worse. MAKE SURE YOU:   Understand these instructions.  Will watch your condition.  Will get help right away if you are not doing well or get worse. This information is not intended to replace advice given to you by your health care provider. Make sure you discuss any questions you have with your health care provider. Document Released: 06/13/2006 Document Revised: 11/28/2012 Document Reviewed: 10/15/2012 Elsevier Interactive Patient Education  2017 [employer].

## 2016-02-01 NOTE — Addendum Note (Signed)
Addended by: Lianne Cure A on: 02/01/2016 08:17 AM   Modules accepted: Orders

## 2016-02-09 ENCOUNTER — Ambulatory Visit: Payer: Self-pay | Admitting: General Surgery

## 2016-02-09 DIAGNOSIS — Z933 Colostomy status: Secondary | ICD-10-CM | POA: Diagnosis not present

## 2016-02-12 ENCOUNTER — Encounter (HOSPITAL_COMMUNITY): Payer: Self-pay

## 2016-02-18 NOTE — H&P (Signed)
Larry [name] HPI: The recent attempt to perform the colonoscopy failed as his long Hartman's pouch was filled with large and thick stool balls.  They were not able to be removed.  Past Medical History:  Diagnosis Date  . Atrial tachycardia ([place])    managed on beta blocker therapy  . Childhood asthma    "went away after I was 16"  . Chronic combined systolic and diastolic CHF, NYHA class 3 (HCC)    has diastolic heart failure grade 1; EF is 45 to 50% per echo 05/2011; EF 41% by Myoview 2016  . CKD (chronic kidney disease) stage 3, GFR 30-59 ml/min   . Colon cancer ([name]) 2015   MSI high; IHC loss of MLH1 and PMS2; BRAF negative; Negative methylation  . COPD (chronic obstructive pulmonary disease) ([place])   . Coronary artery disease   . Family history of ovarian cancer   . Hypercholesteremia   . Hypertension   . Noncompliance   . NSVT (nonsustained ventricular tachycardia) (HCC)    beta blocker restarted  . Obesity   . OSA on CPAP    used nightly, pt does not know settings  . Peripheral arterial disease (HCC)    4.2 cm thoracic aortic aneurysm per chest ct 11-14-15 epic  . S/P colostomy ([name])    2014  . Thrombocytopenia ([place])   . Torsades de pointes ([name])    X 2 episodes during hospital visit 12'14"electrolyte imbalance"- "Shocked"    Past Surgical History:  Procedure Laterality Date  . COLON SURGERY    . COLONOSCOPY N/A 02/08/2013   Procedure: COLONOSCOPY;  Surgeon: [name], MD;  Location: [place];  Service: Endoscopy;  Laterality: N/A;  . COLONOSCOPY WITH PROPOFOL N/A 05/09/2014   Procedure: COLONOSCOPY WITH PROPOFOL;  Surgeon: [name], MD;  [facility];  Service: Endoscopy;  Laterality: N/A;  . COLONOSCOPY WITH PROPOFOL N/A 01/08/2016   Procedure: COLONOSCOPY WITH PROPOFOL;  Surgeon: [name], MD;  [facility];  Service: Endoscopy;  Laterality: N/A;  . CORONARY ANGIOPLASTY WITH STENT PLACEMENT  11/12/2008; 06/11/2014   stent x 2  to RCA; stent x 2 to LAD  . CORONARY STENT PLACEMENT  06/11/2014   m-LAD 3.5 x 16 mm Synergy DES, d-LAD  2.25 x 16 mm Synergy DES  . ESOPHAGOGASTRODUODENOSCOPY N/A 02/08/2013   Procedure: ESOPHAGOGASTRODUODENOSCOPY (EGD);  Surgeon: [name], MD;  Location[facility] ENDOSCOPY;  Service: Endoscopy;  Laterality: N/A;  . LAPAROTOMY N/A 02/12/2013   Procedure: EXPLORATORY LAPAROTOMY PARTIAL COLECTOMY WITH COLOSTOMY;  Surgeon: [name], MD;  Location: [place];  Service: General;  Laterality: N/A;  . LAPAROTOMY N/A 02/18/2013   Procedure: EXPLORATORY LAPAROTOMY/Closure of Wound;  Surgeon: [name], MD;  Location: [place];  Service: General;  Laterality: N/A;  . LEFT HEART CATHETERIZATION WITH CORONARY ANGIOGRAM N/A 06/11/2014   Procedure: LEFT HEART CATHETERIZATION WITH CORONARY ANGIOGRAM;  Surgeon: [name], MD; CFX calcified, 30-40 percent, RCA calcified, 40/50/40%, PDA diffuse disease, LAD 40/75/90% s/p DES 2     Family History  Problem Relation Age of Onset  . Hypertension Father   . Heart disease Father     before age 5  . Diabetes Father   . Hypertension Mother   . Dementia Mother   . Parkinsonism Mother   . Liver cancer Maternal Grandmother     dx in her 29s  . Prostate cancer Maternal Uncle   . Leukemia Maternal Uncle     Social History:  reports that  he quit smoking about 7 years ago. His smoking use included Cigarettes. He has a 38.00 pack-year smoking history. He has never used smokeless tobacco. He reports that he drinks alcohol. He reports that he does not use drugs.  Allergies: No Known Allergies  Medications: Scheduled: Continuous:  No results found for this or any previous visit (from the past 24 hour(s)).   No results found.  ROS:  As stated above in the HPI otherwise negative.  There were no vitals taken for this visit.    PE: Gen: NAD, Alert and Oriented HEENT:  [place]/AT, EOMI Neck: Supple, no LAD Lungs: CTA Bilaterally CV: RRR without  M/G/R ABM: Soft, NTND, +BS Ext: No C/C/E  Assessment/Plan: 1) Personal history of colon cancer - reattempt colonoscopy.  [name] 02/18/2016, 2:58 PM

## 2016-02-19 ENCOUNTER — Ambulatory Visit (HOSPITAL_COMMUNITY)
Admission: RE | Admit: 2016-02-19 | Discharge: 2016-02-19 | Disposition: A | Discharge status: Home / Self Care | Payer: Medicare Other | Source: Ambulatory Visit | Attending: Gastroenterology | Admitting: Gastroenterology

## 2016-02-19 ENCOUNTER — Ambulatory Visit (HOSPITAL_COMMUNITY): Payer: Medicare Other | Admitting: Anesthesiology

## 2016-02-19 ENCOUNTER — Encounter (HOSPITAL_COMMUNITY)
Admission: RE | Disposition: A | Discharge status: Home / Self Care | Payer: Self-pay | Source: Ambulatory Visit | Attending: Gastroenterology

## 2016-02-19 ENCOUNTER — Encounter (HOSPITAL_COMMUNITY): Payer: Self-pay | Admitting: *Deleted

## 2016-02-19 DIAGNOSIS — K5730 Diverticulosis of large intestine without perforation or abscess without bleeding: Secondary | ICD-10-CM | POA: Insufficient documentation

## 2016-02-19 DIAGNOSIS — I959 Hypotension, unspecified: Secondary | ICD-10-CM | POA: Diagnosis not present

## 2016-02-19 DIAGNOSIS — Z1211 Encounter for screening for malignant neoplasm of colon: Secondary | ICD-10-CM | POA: Diagnosis not present

## 2016-02-19 DIAGNOSIS — K5792 Diverticulitis of intestine, part unspecified, without perforation or abscess without bleeding: Secondary | ICD-10-CM | POA: Diagnosis not present

## 2016-02-19 DIAGNOSIS — Z86010 Personal history of colonic polyps: Secondary | ICD-10-CM | POA: Diagnosis not present

## 2016-02-19 DIAGNOSIS — Z85038 Personal history of other malignant neoplasm of large intestine: Secondary | ICD-10-CM | POA: Diagnosis not present

## 2016-02-19 HISTORY — PX: FLEXIBLE SIGMOIDOSCOPY: SHX5431

## 2016-02-19 SURGERY — SIGMOIDOSCOPY, FLEXIBLE
Anesthesia: Monitor Anesthesia Care

## 2016-02-19 MED ORDER — PROPOFOL 10 MG/ML IV BOLUS
INTRAVENOUS | Status: DC | PRN
  Administered 2016-02-19 (×2): 30 mg via INTRAVENOUS

## 2016-02-19 MED ORDER — PROPOFOL 500 MG/50ML IV EMUL
INTRAVENOUS | Status: DC | PRN
Start: 2016-02-19 — End: 2016-02-19
  Administered 2016-02-19: 100 ug/kg/min via INTRAVENOUS

## 2016-02-19 MED ORDER — MEPERIDINE HCL 100 MG/ML IJ SOLN: 6.2500 mg | INTRAMUSCULAR | Status: DC | PRN

## 2016-02-19 MED ORDER — LIDOCAINE 2% (20 MG/ML) 5 ML SYRINGE
INTRAMUSCULAR | Status: DC | PRN
  Administered 2016-02-19: 40 mg via INTRAVENOUS

## 2016-02-19 MED ORDER — PROPOFOL 10 MG/ML IV BOLUS
INTRAVENOUS | Status: AC
  Filled 2016-02-19: qty 60

## 2016-02-19 MED ORDER — SODIUM CHLORIDE 0.9 % IV SOLN
INTRAVENOUS | Status: DC
  Administered 2016-02-19: 500 mL via INTRAVENOUS

## 2016-02-19 MED ORDER — HYDROMORPHONE HCL 1 MG/ML IJ SOLN
0.2500 mg | INTRAMUSCULAR | Status: DC | PRN
Start: 2016-02-19 — End: 2016-02-21

## 2016-02-19 MED ORDER — LIDOCAINE 2% (20 MG/ML) 5 ML SYRINGE
INTRAMUSCULAR | Status: AC
  Filled 2016-02-19: qty 5

## 2016-02-19 MED ORDER — ONDANSETRON HCL 4 MG/2ML IJ SOLN
INTRAMUSCULAR | Status: AC
  Filled 2016-02-19: qty 2

## 2016-02-19 NOTE — Transfer of Care (Signed)
Immediate Anesthesia Transfer of Care Note  Patient: Larry [name]  Procedure(s) Performed: Procedure(s): FLEXIBLE SIGMOIDOSCOPY (N/A)  Patient Location: PACU  Anesthesia Type:MAC  Level of Consciousness:  sedated, patient cooperative and responds to stimulation  Airway & Oxygen Therapy:Patient Spontanous Breathing and Patient connected to face mask oxgen  Post-op Assessment:  Report given to PACU RN and Post -op Vital signs reviewed and stable  Post vital signs:  Reviewed and stable  Last Vitals:  Vitals:   02/19/16 0833  BP: 121/73  Pulse: 83  Resp: 20  Temp: 123XX123 C    Complications: No apparent anesthesia complications

## 2016-02-19 NOTE — Anesthesia Postprocedure Evaluation (Signed)
Anesthesia Post Note  Patient: Larry [name]  Procedure(s) Performed: Procedure(s) (LRB): FLEXIBLE SIGMOIDOSCOPY (N/A)  Patient location during evaluation: PACU Anesthesia Type: MAC Level of consciousness: awake and alert Pain management: pain level controlled Vital Signs Assessment: post-procedure vital signs reviewed and stable Respiratory status: spontaneous breathing, nonlabored ventilation, respiratory function stable and patient connected to nasal cannula oxygen Cardiovascular status: stable and blood pressure returned to baseline Anesthetic complications: no       Last Vitals:  Vitals:   02/19/16 1020 02/19/16 1030  BP: 120/68 121/71  Pulse: 73 75  Resp: 16 14  Temp:      Last Pain:  Vitals:   02/19/16 1013  TempSrc: Oral                 Sherryn Pollino DAVID

## 2016-02-19 NOTE — Discharge Instructions (Signed)

## 2016-02-19 NOTE — Anesthesia Preprocedure Evaluation (Addendum)
Anesthesia Evaluation  Patient identified by MRN, date of birth, ID band Patient awake    Reviewed: Allergy & Precautions, NPO status , Patient's Chart, lab work & pertinent test results  Airway Mallampati: II  TM Distance: >3 FB Neck ROM: Full    Dental   Pulmonary sleep apnea and Continuous Positive Airway Pressure Ventilation , COPD, former smoker,    Pulmonary exam normal        Cardiovascular hypertension, Pt. on medications + CAD, + Cardiac Stents and +CHF  Normal cardiovascular exam     Neuro/Psych    GI/Hepatic   Endo/Other  Morbid obesity  Renal/GU Renal InsufficiencyRenal disease     Musculoskeletal   Abdominal   Peds  Hematology   Anesthesia Other Findings   Reproductive/Obstetrics                            Anesthesia Physical Anesthesia Plan  ASA: III  Anesthesia Plan: MAC   Post-op Pain Management:    Induction: Intravenous  Airway Management Planned: Simple Face Mask  Additional Equipment:   Intra-op Plan:   Post-operative Plan:   Informed Consent: I have reviewed the patients History and Physical, chart, labs and discussed the procedure including the risks, benefits and alternatives for the proposed anesthesia with the patient or authorized representative who has indicated his/her understanding and acceptance.     Plan Discussed with: CRNA and Surgeon  Anesthesia Plan Comments:         Anesthesia Quick Evaluation

## 2016-02-19 NOTE — Op Note (Signed)
[facility] Patient Name: Larry [name] Procedure Date: 02/19/2016 MRN: [id number] Attending MD: [name] , MD Date of Birth: 1950-05-06 CSN: [id number] Age: 65 Admit Type: Outpatient Procedure:                Flexible Sigmoidoscopy Indications:              Personal history of malignant neoplasm of the colon Providers:                [name], MD, Hilma Favors, RN, William Dalton, Technician Referring MD:              Medicines:                Propofol per Anesthesia Complications:            No immediate complications. Estimated Blood Loss:     Estimated blood loss: none. Procedure:                Pre-Anesthesia Assessment:                           - Prior to the procedure, a History and Physical                            was performed, and patient medications and                            allergies were reviewed. The patient's tolerance of                            previous anesthesia was also reviewed. The risks                            and benefits of the procedure and the sedation                            options and risks were discussed with the patient.                            All questions were answered, and informed consent                            was obtained. Prior Anticoagulants: The patient has                            taken no previous anticoagulant or antiplatelet                            agents. ASA Grade Assessment: III - A patient with                            severe systemic disease. After reviewing the risks  and benefits, the patient was deemed in                            satisfactory condition to undergo the procedure.                           - Sedation was administered by an anesthesia                            professional. Deep sedation was attained.                           After obtaining informed consent, the scope was                             passed under direct vision. The [id number]                            scope was introduced through the anus and advanced                            to the the left transverse colon. The flexible                            sigmoidoscopy was accomplished without difficulty.                            The patient tolerated the procedure well. The                            quality of the bowel preparation was adequate. Scope In: 9:58:05 AM Scope Out: 10:05:01 AM Total Procedure Duration: 0 hours 6 minutes 56 seconds  Findings:      Scattered small and large-mouthed diverticula were found in the sigmoid       colon and descending colon.      The prep was much better, however, he did have mucus stool balls in the       rectum and towards the end of the Hartman's pouch. No overt lesions were       identified, i.e., polyps or masses. Impression:               - Diverticulosis in the sigmoid colon and in the                            descending colon.                           - No specimens collected. Moderate Sedation:      N/A- Per Anesthesia Care Recommendation:           - Patient has a contact number available for                            emergencies. The signs and symptoms of potential  delayed complications were discussed with the                            patient. Return to normal activities tomorrow.                            Written discharge instructions were provided to the                            patient.                           - Resume previous diet.                           - Follow up with Dr. Hulen Skains for consideration of                            reanastamosis. Procedure Code(s):        --- Professional ---                           [phone], Sigmoidoscopy, flexible; diagnostic,                            including collection of specimen(s) by brushing or                            washing, when performed (separate  procedure) Diagnosis Code(s):        --- Professional ---                           [id number], Personal history of other malignant                            neoplasm of large intestine                           K57.30, Diverticulosis of large intestine without                            perforation or abscess without bleeding CPT copyright 2016 [employer]. All rights reserved. The codes documented in this report are preliminary and upon coder review may  be revised to meet current compliance requirements. [name], MD [name], MD 02/19/2016 10:18:02 AM This report has been signed electronically. Number of Addenda: 0

## 2016-02-23 ENCOUNTER — Encounter (HOSPITAL_COMMUNITY): Payer: Self-pay | Admitting: Gastroenterology

## 2016-03-25 ENCOUNTER — Other Ambulatory Visit: Payer: Self-pay | Admitting: Nurse Practitioner

## 2016-04-07 ENCOUNTER — Encounter (HOSPITAL_COMMUNITY)
Admission: RE | Admit: 2016-04-07 | Discharge: 2016-04-07 | Disposition: A | Discharge status: Home / Self Care | Payer: Medicare Other | Source: Ambulatory Visit | Attending: General Surgery | Admitting: General Surgery

## 2016-04-07 ENCOUNTER — Ambulatory Visit (HOSPITAL_COMMUNITY)
Admission: RE | Admit: 2016-04-07 | Discharge: 2016-04-07 | Disposition: A | Discharge status: Home / Self Care | Payer: Medicare Other | Source: Ambulatory Visit | Attending: General Surgery | Admitting: General Surgery

## 2016-04-07 ENCOUNTER — Encounter (HOSPITAL_COMMUNITY): Payer: Self-pay

## 2016-04-07 DIAGNOSIS — Z01812 Encounter for preprocedural laboratory examination: Secondary | ICD-10-CM | POA: Diagnosis not present

## 2016-04-07 DIAGNOSIS — I700 Atherosclerosis of aorta: Secondary | ICD-10-CM | POA: Insufficient documentation

## 2016-04-07 DIAGNOSIS — Z01818 Encounter for other preprocedural examination: Secondary | ICD-10-CM | POA: Diagnosis not present

## 2016-04-07 DIAGNOSIS — Z933 Colostomy status: Secondary | ICD-10-CM | POA: Diagnosis not present

## 2016-04-07 DIAGNOSIS — J984 Other disorders of lung: Secondary | ICD-10-CM | POA: Diagnosis not present

## 2016-04-07 DIAGNOSIS — I493 Ventricular premature depolarization: Secondary | ICD-10-CM | POA: Insufficient documentation

## 2016-04-07 HISTORY — DX: Unspecified osteoarthritis, unspecified site: M19.90

## 2016-04-07 LAB — COMPREHENSIVE METABOLIC PANEL
ALBUMIN: 3.9 g/dL (ref 3.5–5.0)
ALT: 32 U/L (ref 17–63)
ANION GAP: 10 (ref 5–15)
AST: 27 U/L (ref 15–41)
Alkaline Phosphatase: 109 U/L (ref 38–126)
BUN: 18 mg/dL (ref 6–20)
CHLORIDE: 104 mmol/L (ref 101–111)
CO2: 24 mmol/L (ref 22–32)
Calcium: 9.9 mg/dL (ref 8.9–10.3)
Creatinine, Ser: 1.76 mg/dL — ABNORMAL HIGH (ref 0.61–1.24)
GFR calc Af Amer: 46 mL/min — ABNORMAL LOW (ref >60)
GFR calc non Af Amer: 39 mL/min — ABNORMAL LOW (ref >60)
GLUCOSE: 94 mg/dL (ref 65–99)
POTASSIUM: 3.9 mmol/L (ref 3.5–5.1)
SODIUM: 138 mmol/L (ref 135–145)
Total Bilirubin: 1.2 mg/dL (ref 0.3–1.2)
Total Protein: 7.6 g/dL (ref 6.5–8.1)

## 2016-04-07 LAB — CBC WITH DIFFERENTIAL/PLATELET
BASOS PCT: 0 %
Basophils Absolute: 0 10*3/uL (ref 0.0–0.1)
EOS ABS: 0.2 10*3/uL (ref 0.0–0.7)
Eosinophils Relative: 3 %
HCT: 42.2 % (ref 39.0–52.0)
HEMOGLOBIN: 13.4 g/dL (ref 13.0–17.0)
Lymphocytes Relative: 27 %
Lymphs Abs: 1.8 10*3/uL (ref 0.7–4.0)
MCH: 27.9 pg (ref 26.0–34.0)
MCHC: 31.8 g/dL (ref 30.0–36.0)
MCV: 87.7 fL (ref 78.0–100.0)
Monocytes Absolute: 0.7 10*3/uL (ref 0.1–1.0)
Monocytes Relative: 10 %
Neutro Abs: 4 10*3/uL (ref 1.7–7.7)
Neutrophils Relative %: 60 %
Platelets: 161 10*3/uL (ref 150–400)
RBC: 4.81 MIL/uL (ref 4.22–5.81)
RDW: 14.5 % (ref 11.5–15.5)
WBC: 6.7 10*3/uL (ref 4.0–10.5)

## 2016-04-07 MED ORDER — CHLORHEXIDINE GLUCONATE CLOTH 2 % EX PADS: 6.0000 | MEDICATED_PAD | Freq: Once | CUTANEOUS | Status: DC

## 2016-04-07 NOTE — Pre-Procedure Instructions (Signed)
Larry [name]  04/07/2016      [id number] [address], [place] [address] [place] [address] Phone: [phone] Fax: [phone]    Your procedure is scheduled on 04-12-2016  Tuesday   Report to [facility] Admitting at 5:30 A.M.   Call this number if you have problems the morning of surgery:  [phone]   Remember:  Do not eat food or drink liquids after midnight.   Take these medicines the morning of surgery with A SIP OF WATER Atorvastatin(Lipitir) ,Levalbuterol inhaler if needed,metoprolol(Lopressor),nitroglycerin if needed,Potassium Chloride,Simethicone,Verapamil(Calan-SR             Drink 1 Boost drink 2 hours prior to arrival for surgery,3:30 AM             STOP ASPIRIN,ANTIINFLAMATORIES (IBUPROFEN,ALEVE,MOTRIN,ADVIL,GOODY'S POWDERS),HERBAL [place] 5-7 DAYS PRIOR TO [place] - Preparing for Surgery  Before surgery, you can play an important role.  Because skin is not sterile, your skin needs to be as free of germs as possible.  You can reduce the number of germs on you skin by washing with CHG (chlorahexidine gluconate) soap before surgery.  CHG is an antiseptic cleaner which kills germs and bonds with the skin to continue killing germs even after washing.  Please DO NOT use if you have an allergy to CHG or antibacterial soaps.  If your skin becomes reddened/irritated stop using the CHG and inform your nurse when you arrive at Short Stay.  Do not shave (including legs and underarms) for at least 48 hours prior to the first CHG shower.  You may shave your face.  Please follow these instructions carefully:   1.  Shower with CHG Soap the night before surgery and the                                morning of Surgery.  2.  If you choose to wash your hair, wash your hair first as usual with your       normal shampoo.  3.  After you shampoo, rinse your hair  and body thoroughly to remove the                      Shampoo.  4.  Use CHG as you would any other liquid soap.  You can apply chg directly to the skin and wash gently with scrungie or a clean washcloth.  5.  Apply the CHG Soap to your body ONLY FROM THE NECK DOWN.        Do not use on open wounds or open sores.  Avoid contact with your eyes,       ears, mouth and genitals (private parts).  Wash genitals (private parts)       with your normal soap.  6.  Wash thoroughly, paying special attention to the area where your surgery  will be performed.  7.  Thoroughly rinse your body with warm water from the neck down.  8.  DO NOT shower/wash with your normal soap after using and rinsing off       the CHG Soap.  9.  Pat yourself dry with a clean towel.            10.  Wear clean pajamas.  11.  Place clean sheets on your bed the night of your first shower and do not        sleep with pets.  Day of Surgery  Do not apply any lotions/deoderants the morning of surgery.  Please wear clean clothes to the hospital/surgery center.      Do not wear jewelry,   Do not wear lotions, powders, or perfumes, or deoderant.  Do not shave 48 hours prior to surgery.  Men may shave face and neck.   Do not bring valuables to the hospital.  [facility] is not responsible for any belongings or valuables.  Contacts, dentures or bridgework may not be worn into surgery.  Leave your suitcase in the car.  After surgery it may be brought to your room.  For patients admitted to the hospital, discharge time will be determined by your treatment team.  Patients discharged the day of surgery will not be allowed to drive home.    Special instructions:  Follow instructions for bowel prep,   Please read over the following fact sheets that you were given. Pain Booklet and Coughing and Deep Breathing ,Bowel prep

## 2016-04-08 LAB — HEMOGLOBIN A1C
Hgb A1c MFr Bld: 5.5 % (ref 4.8–5.6)
Mean Plasma Glucose: 111 mg/dL

## 2016-04-11 MED ORDER — DEXTROSE 5 % IV SOLN
2.0000 g | INTRAVENOUS | Status: AC
  Administered 2016-04-12: 2 g via INTRAVENOUS
  Filled 2016-04-11: qty 2

## 2016-04-12 ENCOUNTER — Encounter (HOSPITAL_COMMUNITY)
Admission: RE | Disposition: A | Discharge status: Nursing Home (Includes ICF) | Payer: Self-pay | Source: Ambulatory Visit | Attending: General Surgery

## 2016-04-12 ENCOUNTER — Inpatient Hospital Stay (HOSPITAL_COMMUNITY): Payer: Medicare Other | Admitting: Certified Registered Nurse Anesthetist

## 2016-04-12 ENCOUNTER — Encounter (HOSPITAL_COMMUNITY): Payer: Self-pay | Admitting: *Deleted

## 2016-04-12 ENCOUNTER — Inpatient Hospital Stay (HOSPITAL_COMMUNITY)
Admission: RE | Admit: 2016-04-12 | Discharge: 2016-05-13 | DRG: 003 | Disposition: A | Discharge status: Other Acute Inpatient Hospital | Payer: Medicare Other | Source: Ambulatory Visit | Attending: General Surgery | Admitting: General Surgery

## 2016-04-12 DIAGNOSIS — J810 Acute pulmonary edema: Secondary | ICD-10-CM | POA: Diagnosis not present

## 2016-04-12 DIAGNOSIS — K660 Peritoneal adhesions (postprocedural) (postinfection): Secondary | ICD-10-CM | POA: Diagnosis present

## 2016-04-12 DIAGNOSIS — Z4659 Encounter for fitting and adjustment of other gastrointestinal appliance and device: Secondary | ICD-10-CM

## 2016-04-12 DIAGNOSIS — L8990 Pressure ulcer of unspecified site, unspecified stage: Secondary | ICD-10-CM | POA: Insufficient documentation

## 2016-04-12 DIAGNOSIS — A419 Sepsis, unspecified organism: Secondary | ICD-10-CM | POA: Diagnosis not present

## 2016-04-12 DIAGNOSIS — E860 Dehydration: Secondary | ICD-10-CM | POA: Diagnosis present

## 2016-04-12 DIAGNOSIS — I509 Heart failure, unspecified: Secondary | ICD-10-CM | POA: Diagnosis not present

## 2016-04-12 DIAGNOSIS — G4733 Obstructive sleep apnea (adult) (pediatric): Secondary | ICD-10-CM | POA: Diagnosis present

## 2016-04-12 DIAGNOSIS — R188 Other ascites: Secondary | ICD-10-CM | POA: Diagnosis not present

## 2016-04-12 DIAGNOSIS — J9601 Acute respiratory failure with hypoxia: Secondary | ICD-10-CM

## 2016-04-12 DIAGNOSIS — K659 Peritonitis, unspecified: Secondary | ICD-10-CM

## 2016-04-12 DIAGNOSIS — I472 Ventricular tachycardia: Secondary | ICD-10-CM

## 2016-04-12 DIAGNOSIS — R339 Retention of urine, unspecified: Secondary | ICD-10-CM | POA: Diagnosis present

## 2016-04-12 DIAGNOSIS — I4581 Long QT syndrome: Secondary | ICD-10-CM | POA: Diagnosis present

## 2016-04-12 DIAGNOSIS — K560 Paralytic ileus: Secondary | ICD-10-CM | POA: Diagnosis not present

## 2016-04-12 DIAGNOSIS — Z933 Colostomy status: Secondary | ICD-10-CM

## 2016-04-12 DIAGNOSIS — R109 Unspecified abdominal pain: Secondary | ICD-10-CM

## 2016-04-12 DIAGNOSIS — R000 Tachycardia, unspecified: Secondary | ICD-10-CM | POA: Diagnosis present

## 2016-04-12 DIAGNOSIS — E662 Morbid (severe) obesity with alveolar hypoventilation: Secondary | ICD-10-CM | POA: Diagnosis not present

## 2016-04-12 DIAGNOSIS — Z7902 Long term (current) use of antithrombotics/antiplatelets: Secondary | ICD-10-CM

## 2016-04-12 DIAGNOSIS — J9811 Atelectasis: Secondary | ICD-10-CM | POA: Diagnosis not present

## 2016-04-12 DIAGNOSIS — E785 Hyperlipidemia, unspecified: Secondary | ICD-10-CM | POA: Diagnosis not present

## 2016-04-12 DIAGNOSIS — J9690 Respiratory failure, unspecified, unspecified whether with hypoxia or hypercapnia: Secondary | ICD-10-CM

## 2016-04-12 DIAGNOSIS — J9600 Acute respiratory failure, unspecified whether with hypoxia or hypercapnia: Secondary | ICD-10-CM | POA: Diagnosis not present

## 2016-04-12 DIAGNOSIS — E8770 Fluid overload, unspecified: Secondary | ICD-10-CM

## 2016-04-12 DIAGNOSIS — J9621 Acute and chronic respiratory failure with hypoxia: Secondary | ICD-10-CM | POA: Diagnosis not present

## 2016-04-12 DIAGNOSIS — R748 Abnormal levels of other serum enzymes: Secondary | ICD-10-CM | POA: Diagnosis not present

## 2016-04-12 DIAGNOSIS — R6521 Severe sepsis with septic shock: Secondary | ICD-10-CM | POA: Diagnosis not present

## 2016-04-12 DIAGNOSIS — N179 Acute kidney failure, unspecified: Secondary | ICD-10-CM | POA: Diagnosis not present

## 2016-04-12 DIAGNOSIS — I96 Gangrene, not elsewhere classified: Secondary | ICD-10-CM | POA: Diagnosis not present

## 2016-04-12 DIAGNOSIS — I479 Paroxysmal tachycardia, unspecified: Secondary | ICD-10-CM | POA: Diagnosis not present

## 2016-04-12 DIAGNOSIS — I739 Peripheral vascular disease, unspecified: Secondary | ICD-10-CM | POA: Diagnosis present

## 2016-04-12 DIAGNOSIS — Z452 Encounter for adjustment and management of vascular access device: Secondary | ICD-10-CM | POA: Diagnosis not present

## 2016-04-12 DIAGNOSIS — I471 Supraventricular tachycardia: Secondary | ICD-10-CM | POA: Diagnosis present

## 2016-04-12 DIAGNOSIS — R739 Hyperglycemia, unspecified: Secondary | ICD-10-CM | POA: Diagnosis present

## 2016-04-12 DIAGNOSIS — K650 Generalized (acute) peritonitis: Secondary | ICD-10-CM | POA: Diagnosis not present

## 2016-04-12 DIAGNOSIS — Y832 Surgical operation with anastomosis, bypass or graft as the cause of abnormal reaction of the patient, or of later complication, without mention of misadventure at the time of the procedure: Secondary | ICD-10-CM | POA: Diagnosis not present

## 2016-04-12 DIAGNOSIS — K56609 Unspecified intestinal obstruction, unspecified as to partial versus complete obstruction: Secondary | ICD-10-CM

## 2016-04-12 DIAGNOSIS — R1010 Upper abdominal pain, unspecified: Secondary | ICD-10-CM | POA: Diagnosis not present

## 2016-04-12 DIAGNOSIS — Z9289 Personal history of other medical treatment: Secondary | ICD-10-CM

## 2016-04-12 DIAGNOSIS — Z85038 Personal history of other malignant neoplasm of large intestine: Secondary | ICD-10-CM

## 2016-04-12 DIAGNOSIS — J811 Chronic pulmonary edema: Secondary | ICD-10-CM | POA: Diagnosis not present

## 2016-04-12 DIAGNOSIS — J449 Chronic obstructive pulmonary disease, unspecified: Secondary | ICD-10-CM | POA: Diagnosis present

## 2016-04-12 DIAGNOSIS — Z433 Encounter for attention to colostomy: Secondary | ICD-10-CM | POA: Diagnosis not present

## 2016-04-12 DIAGNOSIS — M7989 Other specified soft tissue disorders: Secondary | ICD-10-CM | POA: Diagnosis not present

## 2016-04-12 DIAGNOSIS — N183 Chronic kidney disease, stage 3 (moderate): Secondary | ICD-10-CM | POA: Diagnosis not present

## 2016-04-12 DIAGNOSIS — I5042 Chronic combined systolic (congestive) and diastolic (congestive) heart failure: Secondary | ICD-10-CM | POA: Diagnosis present

## 2016-04-12 DIAGNOSIS — I4891 Unspecified atrial fibrillation: Secondary | ICD-10-CM | POA: Diagnosis not present

## 2016-04-12 DIAGNOSIS — E8779 Other fluid overload: Secondary | ICD-10-CM | POA: Diagnosis not present

## 2016-04-12 DIAGNOSIS — I493 Ventricular premature depolarization: Secondary | ICD-10-CM | POA: Diagnosis not present

## 2016-04-12 DIAGNOSIS — T8130XA Disruption of wound, unspecified, initial encounter: Secondary | ICD-10-CM | POA: Diagnosis not present

## 2016-04-12 DIAGNOSIS — I2510 Atherosclerotic heart disease of native coronary artery without angina pectoris: Secondary | ICD-10-CM | POA: Diagnosis present

## 2016-04-12 DIAGNOSIS — K668 Other specified disorders of peritoneum: Secondary | ICD-10-CM | POA: Diagnosis not present

## 2016-04-12 DIAGNOSIS — I130 Hypertensive heart and chronic kidney disease with heart failure and stage 1 through stage 4 chronic kidney disease, or unspecified chronic kidney disease: Secondary | ICD-10-CM | POA: Diagnosis present

## 2016-04-12 DIAGNOSIS — E870 Hyperosmolality and hypernatremia: Secondary | ICD-10-CM | POA: Diagnosis not present

## 2016-04-12 DIAGNOSIS — E876 Hypokalemia: Secondary | ICD-10-CM | POA: Diagnosis not present

## 2016-04-12 DIAGNOSIS — I110 Hypertensive heart disease with heart failure: Secondary | ICD-10-CM | POA: Diagnosis not present

## 2016-04-12 DIAGNOSIS — Z87891 Personal history of nicotine dependence: Secondary | ICD-10-CM

## 2016-04-12 DIAGNOSIS — J9622 Acute and chronic respiratory failure with hypercapnia: Secondary | ICD-10-CM | POA: Diagnosis not present

## 2016-04-12 DIAGNOSIS — K9189 Other postprocedural complications and disorders of digestive system: Secondary | ICD-10-CM | POA: Diagnosis not present

## 2016-04-12 DIAGNOSIS — Z930 Tracheostomy status: Secondary | ICD-10-CM

## 2016-04-12 DIAGNOSIS — G9340 Encephalopathy, unspecified: Secondary | ICD-10-CM | POA: Diagnosis not present

## 2016-04-12 DIAGNOSIS — K9409 Other complications of colostomy: Secondary | ICD-10-CM | POA: Diagnosis not present

## 2016-04-12 DIAGNOSIS — Z6841 Body Mass Index (BMI) 40.0 and over, adult: Secondary | ICD-10-CM | POA: Diagnosis not present

## 2016-04-12 DIAGNOSIS — R1084 Generalized abdominal pain: Secondary | ICD-10-CM | POA: Diagnosis not present

## 2016-04-12 DIAGNOSIS — R451 Restlessness and agitation: Secondary | ICD-10-CM | POA: Diagnosis not present

## 2016-04-12 DIAGNOSIS — Z4682 Encounter for fitting and adjustment of non-vascular catheter: Secondary | ICD-10-CM | POA: Diagnosis not present

## 2016-04-12 DIAGNOSIS — Z7982 Long term (current) use of aspirin: Secondary | ICD-10-CM

## 2016-04-12 DIAGNOSIS — R918 Other nonspecific abnormal finding of lung field: Secondary | ICD-10-CM | POA: Diagnosis not present

## 2016-04-12 DIAGNOSIS — M79609 Pain in unspecified limb: Secondary | ICD-10-CM | POA: Diagnosis not present

## 2016-04-12 DIAGNOSIS — E7800 Pure hypercholesterolemia, unspecified: Secondary | ICD-10-CM | POA: Diagnosis present

## 2016-04-12 DIAGNOSIS — I4721 Torsades de pointes: Secondary | ICD-10-CM

## 2016-04-12 DIAGNOSIS — J90 Pleural effusion, not elsewhere classified: Secondary | ICD-10-CM | POA: Diagnosis not present

## 2016-04-12 DIAGNOSIS — I5023 Acute on chronic systolic (congestive) heart failure: Secondary | ICD-10-CM | POA: Diagnosis not present

## 2016-04-12 DIAGNOSIS — R0989 Other specified symptoms and signs involving the circulatory and respiratory systems: Secondary | ICD-10-CM | POA: Diagnosis not present

## 2016-04-12 DIAGNOSIS — I10 Essential (primary) hypertension: Secondary | ICD-10-CM | POA: Diagnosis not present

## 2016-04-12 DIAGNOSIS — Z955 Presence of coronary angioplasty implant and graft: Secondary | ICD-10-CM

## 2016-04-12 DIAGNOSIS — Z79899 Other long term (current) drug therapy: Secondary | ICD-10-CM

## 2016-04-12 DIAGNOSIS — I255 Ischemic cardiomyopathy: Secondary | ICD-10-CM | POA: Diagnosis present

## 2016-04-12 DIAGNOSIS — D649 Anemia, unspecified: Secondary | ICD-10-CM | POA: Diagnosis not present

## 2016-04-12 HISTORY — PX: COLOSTOMY REVERSAL: SHX5782

## 2016-04-12 SURGERY — COLOSTOMY REVERSAL
Anesthesia: General | Site: Abdomen

## 2016-04-12 MED ORDER — ONDANSETRON HCL 4 MG/2ML IJ SOLN
4.0000 mg | Freq: Four times a day (QID) | INTRAMUSCULAR | Status: DC | PRN
  Administered 2016-04-14 – 2016-04-18 (×2): 4 mg via INTRAVENOUS
  Filled 2016-04-12 (×2): qty 2

## 2016-04-12 MED ORDER — PROPOFOL 10 MG/ML IV BOLUS
INTRAVENOUS | Status: AC
  Filled 2016-04-12: qty 20

## 2016-04-12 MED ORDER — VERAPAMIL HCL ER 240 MG PO TBCR
240.0000 mg | EXTENDED_RELEASE_TABLET | Freq: Every day | ORAL | Status: DC
  Administered 2016-04-12 – 2016-04-16 (×5): 240 mg via ORAL
  Filled 2016-04-12 (×5): qty 1

## 2016-04-12 MED ORDER — HYDROMORPHONE HCL 1 MG/ML IJ SOLN
0.2500 mg | INTRAMUSCULAR | Status: DC | PRN
  Administered 2016-04-12 (×2): 0.25 mg via INTRAVENOUS
  Administered 2016-04-12 (×3): 0.5 mg via INTRAVENOUS

## 2016-04-12 MED ORDER — 0.9 % SODIUM CHLORIDE (POUR BTL) OPTIME
TOPICAL | Status: DC | PRN
  Administered 2016-04-12 (×3): 1000 mL

## 2016-04-12 MED ORDER — DIPHENHYDRAMINE HCL 12.5 MG/5ML PO ELIX: 12.5000 mg | ORAL_SOLUTION | Freq: Four times a day (QID) | ORAL | Status: DC | PRN

## 2016-04-12 MED ORDER — DEXTROSE 5 % IV SOLN
2.0000 g | Freq: Two times a day (BID) | INTRAVENOUS | Status: AC
  Administered 2016-04-12: 2 g via INTRAVENOUS
  Filled 2016-04-12: qty 2

## 2016-04-12 MED ORDER — SUGAMMADEX SODIUM 500 MG/5ML IV SOLN
INTRAVENOUS | Status: AC
  Filled 2016-04-12: qty 5

## 2016-04-12 MED ORDER — ATORVASTATIN CALCIUM 40 MG PO TABS
40.0000 mg | ORAL_TABLET | Freq: Every day | ORAL | Status: DC
Start: 2016-04-12 — End: 2016-04-19
  Administered 2016-04-12 – 2016-04-18 (×6): 40 mg via ORAL
  Filled 2016-04-12 (×6): qty 1

## 2016-04-12 MED ORDER — SUCCINYLCHOLINE CHLORIDE 200 MG/10ML IV SOSY
PREFILLED_SYRINGE | INTRAVENOUS | Status: DC | PRN
  Administered 2016-04-12: 140 mg via INTRAVENOUS

## 2016-04-12 MED ORDER — ONDANSETRON HCL 4 MG/2ML IJ SOLN
INTRAMUSCULAR | Status: DC | PRN
  Administered 2016-04-12: 4 mg via INTRAVENOUS

## 2016-04-12 MED ORDER — DIPHENHYDRAMINE HCL 50 MG/ML IJ SOLN: 12.5000 mg | Freq: Four times a day (QID) | INTRAMUSCULAR | Status: DC | PRN

## 2016-04-12 MED ORDER — SPIRONOLACTONE 25 MG PO TABS
25.0000 mg | ORAL_TABLET | Freq: Every day | ORAL | Status: DC
  Administered 2016-04-12 – 2016-04-16 (×5): 25 mg via ORAL
  Filled 2016-04-12 (×5): qty 1

## 2016-04-12 MED ORDER — POVIDONE-IODINE 10 % EX OINT
TOPICAL_OINTMENT | CUTANEOUS | Status: AC
  Filled 2016-04-12: qty 28.35

## 2016-04-12 MED ORDER — ROCURONIUM BROMIDE 10 MG/ML (PF) SYRINGE
PREFILLED_SYRINGE | INTRAVENOUS | Status: DC | PRN
  Administered 2016-04-12 (×2): 10 mg via INTRAVENOUS
  Administered 2016-04-12: 40 mg via INTRAVENOUS
  Administered 2016-04-12: 10 mg via INTRAVENOUS

## 2016-04-12 MED ORDER — FENTANYL CITRATE (PF) 100 MCG/2ML IJ SOLN
INTRAMUSCULAR | Status: AC
  Filled 2016-04-12: qty 2

## 2016-04-12 MED ORDER — METOPROLOL TARTRATE 12.5 MG HALF TABLET
ORAL_TABLET | ORAL | Status: AC
  Filled 2016-04-12: qty 3

## 2016-04-12 MED ORDER — CEFOTETAN DISODIUM-DEXTROSE 2-2.08 GM-% IV SOLR
INTRAVENOUS | Status: AC
  Filled 2016-04-12: qty 50

## 2016-04-12 MED ORDER — METOPROLOL TARTRATE 25 MG PO TABS
37.5000 mg | ORAL_TABLET | Freq: Two times a day (BID) | ORAL | Status: DC
  Administered 2016-04-12 – 2016-04-16 (×8): 37.5 mg via ORAL
  Filled 2016-04-12 (×8): qty 1

## 2016-04-12 MED ORDER — LIDOCAINE 2% (20 MG/ML) 5 ML SYRINGE
INTRAMUSCULAR | Status: DC | PRN
  Administered 2016-04-12: 100 mg via INTRAVENOUS

## 2016-04-12 MED ORDER — MIDAZOLAM HCL 2 MG/2ML IJ SOLN
INTRAMUSCULAR | Status: AC
  Filled 2016-04-12: qty 2

## 2016-04-12 MED ORDER — FUROSEMIDE 40 MG PO TABS
40.0000 mg | ORAL_TABLET | Freq: Every day | ORAL | Status: DC
  Administered 2016-04-12 – 2016-04-16 (×5): 40 mg via ORAL
  Filled 2016-04-12 (×5): qty 1

## 2016-04-12 MED ORDER — HYDROMORPHONE 1 MG/ML IV SOLN
INTRAVENOUS | Status: DC
  Administered 2016-04-12: 1.2 mg via INTRAVENOUS
  Administered 2016-04-12: 10:00:00 via INTRAVENOUS
  Administered 2016-04-12: 0.9 mg via INTRAVENOUS
  Administered 2016-04-12: 1.2 mg via INTRAVENOUS
  Administered 2016-04-13: 0.3 mg via INTRAVENOUS
  Administered 2016-04-13: 0.9 mg via INTRAVENOUS
  Administered 2016-04-13: 3.3 mg via INTRAVENOUS
  Administered 2016-04-13: 3 mg via INTRAVENOUS
  Administered 2016-04-13: 3.3 mg via INTRAVENOUS
  Administered 2016-04-13: 1.8 mg via INTRAVENOUS
  Administered 2016-04-14: 1.2 mg via INTRAVENOUS
  Administered 2016-04-14: 2.1 mg via INTRAVENOUS
  Administered 2016-04-14: 3 mg via INTRAVENOUS
  Administered 2016-04-14: 2.1 mg via INTRAVENOUS
  Administered 2016-04-14: 2 mg via INTRAVENOUS
  Administered 2016-04-15: 3.6 mg via INTRAVENOUS
  Administered 2016-04-15: 3 mg via INTRAVENOUS
  Administered 2016-04-15: 2.1 mg via INTRAVENOUS
  Administered 2016-04-16: 1.8 mg via INTRAVENOUS
  Administered 2016-04-16: 1.5 mg via INTRAVENOUS
  Administered 2016-04-16: 3.6 mg via INTRAVENOUS
  Administered 2016-04-16: 2 mg via INTRAVENOUS
  Administered 2016-04-16: 1.8 mg via INTRAVENOUS
  Administered 2016-04-16: 08:00:00 via INTRAVENOUS
  Administered 2016-04-17: 10 mg via INTRAVENOUS
  Administered 2016-04-17: 0.3 mg via INTRAVENOUS
  Administered 2016-04-17: 0 mg via INTRAVENOUS
  Administered 2016-04-17: 2.7 mg via INTRAVENOUS
  Administered 2016-04-17: 0.3 mg via INTRAVENOUS
  Administered 2016-04-17: 0 mg via INTRAVENOUS
  Administered 2016-04-18: 0.9 mg via INTRAVENOUS
  Administered 2016-04-18: 5 mg via INTRAVENOUS
  Administered 2016-04-18: 2 mg via INTRAVENOUS
  Administered 2016-04-18: 2.1 mg via INTRAVENOUS
  Administered 2016-04-19: 0.3 mg via INTRAVENOUS
  Administered 2016-04-19: 3.3 mg via INTRAVENOUS
  Filled 2016-04-12 (×3): qty 25

## 2016-04-12 MED ORDER — SODIUM CHLORIDE 0.9% FLUSH: 9.0000 mL | INTRAVENOUS | Status: DC | PRN

## 2016-04-12 MED ORDER — METOPROLOL TARTRATE 12.5 MG HALF TABLET
37.5000 mg | ORAL_TABLET | Freq: Once | ORAL | Status: AC
  Administered 2016-04-12: 37.5 mg via ORAL
  Filled 2016-04-12 (×2): qty 1

## 2016-04-12 MED ORDER — LEVALBUTEROL HCL 0.63 MG/3ML IN NEBU: 0.6300 mg | INHALATION_SOLUTION | Freq: Four times a day (QID) | RESPIRATORY_TRACT | Status: DC

## 2016-04-12 MED ORDER — NALOXONE HCL 0.4 MG/ML IJ SOLN: 0.4000 mg | INTRAMUSCULAR | Status: DC | PRN

## 2016-04-12 MED ORDER — PROPOFOL 10 MG/ML IV BOLUS
INTRAVENOUS | Status: DC | PRN
  Administered 2016-04-12: 200 mg via INTRAVENOUS

## 2016-04-12 MED ORDER — LEVALBUTEROL HCL 0.63 MG/3ML IN NEBU
0.6300 mg | INHALATION_SOLUTION | RESPIRATORY_TRACT | Status: DC | PRN
  Filled 2016-04-12: qty 3

## 2016-04-12 MED ORDER — NITROGLYCERIN 0.4 MG SL SUBL: 0.4000 mg | SUBLINGUAL_TABLET | SUBLINGUAL | Status: DC | PRN

## 2016-04-12 MED ORDER — MIDAZOLAM HCL 5 MG/5ML IJ SOLN
INTRAMUSCULAR | Status: DC | PRN
  Administered 2016-04-12: 2 mg via INTRAVENOUS

## 2016-04-12 MED ORDER — HYDROMORPHONE 1 MG/ML IV SOLN
INTRAVENOUS | Status: AC
  Filled 2016-04-12: qty 25

## 2016-04-12 MED ORDER — SUCCINYLCHOLINE CHLORIDE 200 MG/10ML IV SOSY
PREFILLED_SYRINGE | INTRAVENOUS | Status: AC
  Filled 2016-04-12: qty 10

## 2016-04-12 MED ORDER — PHENYLEPHRINE 40 MCG/ML (10ML) SYRINGE FOR IV PUSH (FOR BLOOD PRESSURE SUPPORT)
PREFILLED_SYRINGE | INTRAVENOUS | Status: AC
  Filled 2016-04-12: qty 10

## 2016-04-12 MED ORDER — ALVIMOPAN 12 MG PO CAPS
12.0000 mg | ORAL_CAPSULE | Freq: Once | ORAL | Status: AC
Start: 2016-04-12 — End: 2016-04-12
  Administered 2016-04-12: 12 mg via ORAL
  Filled 2016-04-12: qty 1

## 2016-04-12 MED ORDER — SIMETHICONE 80 MG PO CHEW
80.0000 mg | CHEWABLE_TABLET | Freq: Four times a day (QID) | ORAL | Status: DC
  Administered 2016-04-12 – 2016-05-04 (×66): 80 mg via ORAL
  Filled 2016-04-12 (×72): qty 1

## 2016-04-12 MED ORDER — SODIUM CHLORIDE 0.9 % IV SOLN
INTRAVENOUS | Status: DC
  Administered 2016-04-12 – 2016-04-16 (×7): via INTRAVENOUS

## 2016-04-12 MED ORDER — POLYETHYLENE GLYCOL 3350 17 GM/SCOOP PO POWD: 1.0000 | Freq: Once | ORAL | Status: DC

## 2016-04-12 MED ORDER — MAGNESIUM 500 MG PO CAPS: 500.0000 mg | ORAL_CAPSULE | Freq: Every day | ORAL | Status: DC

## 2016-04-12 MED ORDER — HYDROMORPHONE HCL 1 MG/ML IJ SOLN
INTRAMUSCULAR | Status: AC
  Administered 2016-04-12: 0.5 mg via INTRAVENOUS
  Filled 2016-04-12: qty 0.5

## 2016-04-12 MED ORDER — FERROUS SULFATE 325 (65 FE) MG PO TABS
325.0000 mg | ORAL_TABLET | Freq: Every day | ORAL | Status: DC
  Administered 2016-04-14 – 2016-04-19 (×4): 325 mg via ORAL
  Filled 2016-04-12 (×5): qty 1

## 2016-04-12 MED ORDER — ADULT MULTIVITAMIN W/MINERALS CH
1.0000 | ORAL_TABLET | Freq: Every day | ORAL | Status: DC
  Administered 2016-04-12 – 2016-04-18 (×5): 1 via ORAL
  Filled 2016-04-12 (×5): qty 1

## 2016-04-12 MED ORDER — ROCURONIUM BROMIDE 50 MG/5ML IV SOSY
PREFILLED_SYRINGE | INTRAVENOUS | Status: AC
  Filled 2016-04-12: qty 30

## 2016-04-12 MED ORDER — ETOMIDATE 2 MG/ML IV SOLN
INTRAVENOUS | Status: AC
  Filled 2016-04-12: qty 10

## 2016-04-12 MED ORDER — METRONIDAZOLE 500 MG PO TABS: 1000.0000 mg | ORAL_TABLET | ORAL | Status: DC

## 2016-04-12 MED ORDER — HYDROMORPHONE HCL 1 MG/ML IJ SOLN
INTRAMUSCULAR | Status: AC
  Administered 2016-04-12: 0.25 mg via INTRAVENOUS
  Filled 2016-04-12: qty 0.5

## 2016-04-12 MED ORDER — ACETAMINOPHEN 325 MG PO TABS: 650.0000 mg | ORAL_TABLET | Freq: Four times a day (QID) | ORAL | Status: DC | PRN

## 2016-04-12 MED ORDER — ONDANSETRON HCL 4 MG/2ML IJ SOLN
INTRAMUSCULAR | Status: AC
  Filled 2016-04-12: qty 2

## 2016-04-12 MED ORDER — SUGAMMADEX SODIUM 500 MG/5ML IV SOLN
INTRAVENOUS | Status: DC | PRN
  Administered 2016-04-12: 350 mg via INTRAVENOUS

## 2016-04-12 MED ORDER — LIDOCAINE 2% (20 MG/ML) 5 ML SYRINGE
INTRAMUSCULAR | Status: AC
  Filled 2016-04-12: qty 20

## 2016-04-12 MED ORDER — NEOMYCIN SULFATE 500 MG PO TABS: 1000.0000 mg | ORAL_TABLET | ORAL | Status: DC

## 2016-04-12 MED ORDER — ONDANSETRON HCL 4 MG/2ML IJ SOLN
INTRAMUSCULAR | Status: AC
  Filled 2016-04-12: qty 4

## 2016-04-12 MED ORDER — FENTANYL CITRATE (PF) 100 MCG/2ML IJ SOLN
INTRAMUSCULAR | Status: DC | PRN
  Administered 2016-04-12: 200 ug via INTRAVENOUS
  Administered 2016-04-12 (×2): 50 ug via INTRAVENOUS

## 2016-04-12 MED ORDER — PROMETHAZINE HCL 25 MG/ML IJ SOLN: 6.2500 mg | INTRAMUSCULAR | Status: DC | PRN

## 2016-04-12 MED ORDER — ENOXAPARIN SODIUM 40 MG/0.4ML ~~LOC~~ SOLN
40.0000 mg | SUBCUTANEOUS | Status: DC
  Administered 2016-04-13 – 2016-04-23 (×11): 40 mg via SUBCUTANEOUS
  Filled 2016-04-12 (×11): qty 0.4

## 2016-04-12 MED ORDER — EPHEDRINE 5 MG/ML INJ
INTRAVENOUS | Status: AC
  Filled 2016-04-12: qty 10

## 2016-04-12 MED ORDER — ALVIMOPAN 12 MG PO CAPS
12.0000 mg | ORAL_CAPSULE | Freq: Two times a day (BID) | ORAL | Status: DC
  Administered 2016-04-13 – 2016-04-18 (×11): 12 mg via ORAL
  Filled 2016-04-12 (×12): qty 1

## 2016-04-12 MED ORDER — LACTATED RINGERS IV SOLN
INTRAVENOUS | Status: DC | PRN
  Administered 2016-04-12 (×2): via INTRAVENOUS

## 2016-04-12 MED ORDER — POTASSIUM CHLORIDE CRYS ER 20 MEQ PO TBCR
20.0000 meq | EXTENDED_RELEASE_TABLET | Freq: Two times a day (BID) | ORAL | Status: DC
  Administered 2016-04-12 – 2016-04-16 (×9): 20 meq via ORAL
  Filled 2016-04-12 (×9): qty 1

## 2016-04-12 MED ORDER — HYDROMORPHONE HCL 1 MG/ML IJ SOLN
INTRAMUSCULAR | Status: AC
  Administered 2016-04-12: 0.5 mg via INTRAVENOUS
  Filled 2016-04-12: qty 1

## 2016-04-12 SURGICAL SUPPLY — 69 items
BLADE SURG ROTATE 9660 (MISCELLANEOUS) ×3 IMPLANT
CANISTER SUCTION 2500CC (MISCELLANEOUS) ×3 IMPLANT
CHLORAPREP W/TINT 26ML (MISCELLANEOUS) ×3 IMPLANT
COVER MAYO STAND STRL (DRAPES) ×3 IMPLANT
COVER SURGICAL LIGHT HANDLE (MISCELLANEOUS) ×6 IMPLANT
DRAIN PENROSE 1/4X12 LTX STRL (WOUND CARE) ×3 IMPLANT
DRAPE LAPAROSCOPIC ABDOMINAL (DRAPES) ×3 IMPLANT
DRAPE PROXIMA HALF (DRAPES) ×3 IMPLANT
DRAPE UTILITY XL STRL (DRAPES) ×3 IMPLANT
DRAPE WARM FLUID 44X44 (DRAPE) ×3 IMPLANT
DRSG OPSITE POSTOP 4X10 (GAUZE/BANDAGES/DRESSINGS) ×6 IMPLANT
DRSG OPSITE POSTOP 4X6 (GAUZE/BANDAGES/DRESSINGS) ×6 IMPLANT
DRSG OPSITE POSTOP 4X8 (GAUZE/BANDAGES/DRESSINGS) IMPLANT
DRSG TEGADERM 4X4.75 (GAUZE/BANDAGES/DRESSINGS) ×9 IMPLANT
ELECT BLADE 6.5 EXT (BLADE) ×3 IMPLANT
ELECT CAUTERY BLADE 6.4 (BLADE) ×6 IMPLANT
ELECT REM PT RETURN 9FT ADLT (ELECTROSURGICAL) ×3
ELECTRODE REM PT RTRN 9FT ADLT (ELECTROSURGICAL) ×1 IMPLANT
GAUZE SPONGE 4X4 12PLY STRL (GAUZE/BANDAGES/DRESSINGS) ×3 IMPLANT
GLOVE BIO SURGEON STRL SZ7.5 (GLOVE) ×3 IMPLANT
GLOVE BIOGEL PI IND STRL 6.5 (GLOVE) ×2 IMPLANT
GLOVE BIOGEL PI IND STRL 7.0 (GLOVE) ×2 IMPLANT
GLOVE BIOGEL PI IND STRL 7.5 (GLOVE) ×3 IMPLANT
GLOVE BIOGEL PI IND STRL 8 (GLOVE) ×3 IMPLANT
GLOVE BIOGEL PI INDICATOR 6.5 (GLOVE) ×4
GLOVE BIOGEL PI INDICATOR 7.0 (GLOVE) ×4
GLOVE BIOGEL PI INDICATOR 7.5 (GLOVE) ×6
GLOVE BIOGEL PI INDICATOR 8 (GLOVE) ×6
GLOVE ECLIPSE 7.5 STRL STRAW (GLOVE) ×9 IMPLANT
GLOVE SURG SS PI 6.5 STRL IVOR (GLOVE) ×6 IMPLANT
GLOVE SURG SS PI 7.0 STRL IVOR (GLOVE) ×6 IMPLANT
GOWN STRL REUS W/ TWL LRG LVL3 (GOWN DISPOSABLE) ×8 IMPLANT
GOWN STRL REUS W/ TWL XL LVL3 (GOWN DISPOSABLE) ×1 IMPLANT
GOWN STRL REUS W/TWL LRG LVL3 (GOWN DISPOSABLE) ×16
GOWN STRL REUS W/TWL XL LVL3 (GOWN DISPOSABLE) ×2
KIT BASIN OR (CUSTOM PROCEDURE TRAY) ×3 IMPLANT
KIT ROOM TURNOVER OR (KITS) ×3 IMPLANT
LEGGING LITHOTOMY PAIR STRL (DRAPES) IMPLANT
LIGASURE IMPACT 36 18CM CVD LR (INSTRUMENTS) ×3 IMPLANT
NS IRRIG 1000ML POUR BTL (IV SOLUTION) ×9 IMPLANT
PACK GENERAL/GYN (CUSTOM PROCEDURE TRAY) ×3 IMPLANT
PAD ARMBOARD 7.5X6 YLW CONV (MISCELLANEOUS) ×3 IMPLANT
PENCIL BUTTON HOLSTER BLD 10FT (ELECTRODE) ×3 IMPLANT
RELOAD PROXIMATE 75MM BLUE (ENDOMECHANICALS) ×3 IMPLANT
SPECIMEN JAR MEDIUM (MISCELLANEOUS) ×3 IMPLANT
SPONGE LAP 18X18 X RAY DECT (DISPOSABLE) ×3 IMPLANT
STAPLER GUN LINEAR PROX 60 (STAPLE) ×3 IMPLANT
STAPLER PROXIMATE 75MM BLUE (STAPLE) ×3 IMPLANT
STAPLER VISISTAT 35W (STAPLE) ×3 IMPLANT
SUCTION POOLE TIP (SUCTIONS) ×3 IMPLANT
SURGILUBE 2OZ TUBE FLIPTOP (MISCELLANEOUS) IMPLANT
SUT NOVA 1 T20/GS 25DT (SUTURE) ×6 IMPLANT
SUT NOVA NAB DX-16 0-1 5-0 T12 (SUTURE) IMPLANT
SUT PDS AB 1 TP1 96 (SUTURE) ×6 IMPLANT
SUT SILK 2 0 SH (SUTURE) ×3 IMPLANT
SUT SILK 2 0 SH CR/8 (SUTURE) ×3 IMPLANT
SUT SILK 2 0 TIES 10X30 (SUTURE) ×3 IMPLANT
SUT SILK 3 0 SH CR/8 (SUTURE) ×3 IMPLANT
SUT SILK 3 0 TIES 10X30 (SUTURE) ×3 IMPLANT
SUT VIC AB 1 CT1 18XCR BRD 8 (SUTURE) ×1 IMPLANT
SUT VIC AB 1 CT1 8-18 (SUTURE) ×2
SYR BULB IRRIGATION 50ML (SYRINGE) ×3 IMPLANT
TOWEL OR 17X26 10 PK STRL BLUE (TOWEL DISPOSABLE) ×3 IMPLANT
TRAY FOLEY CATH 14FRSI W/METER (CATHETERS) ×3 IMPLANT
TRAY PROCTOSCOPIC FIBER OPTIC (SET/KITS/TRAYS/PACK) IMPLANT
TUBE CONNECTING 12'X1/4 (SUCTIONS) ×1
TUBE CONNECTING 12X1/4 (SUCTIONS) ×2 IMPLANT
WATER STERILE IRR 1000ML UROMA (IV SOLUTION) ×3 IMPLANT
YANKAUER SUCT BULB TIP NO VENT (SUCTIONS) ×3 IMPLANT

## 2016-04-12 NOTE — Anesthesia Procedure Notes (Signed)
Procedure Name: Intubation Date/Time: 04/12/2016 7:43 AM Performed by: Myna Bright Pre-anesthesia Checklist: Patient identified, Emergency Drugs available, Suction available and Patient being monitored Patient Re-evaluated:Patient Re-evaluated prior to inductionOxygen Delivery Method: Circle system utilized Preoxygenation: Pre-oxygenation with 100% oxygen Intubation Type: IV induction and Rapid sequence Ventilation: Unable to mask ventilate Laryngoscope Size: Glidescope and 4 Tube type: Oral Tube size: 7.5 mm Number of attempts: 1 Airway Equipment and Method: Stylet and Video-laryngoscopy Placement Confirmation: ETT inserted through vocal cords under direct vision,  positive ETCO2 and breath sounds checked- equal and bilateral Secured at: 23 cm Tube secured with: Tape Dental Injury: Teeth and Oropharynx as per pre-operative assessment  Difficulty Due To: Difficulty was anticipated, Difficult Airway- due to limited oral opening, Difficult Airway- due to large tongue and Difficult Airway- due to reduced neck mobility Future Recommendations: Recommend- induction with short-acting agent, and alternative techniques readily available Comments: Smooth IV induction using anectine after 3 minutes of preoxygenation. Unable to mask ventilate. DL with Glidescope. Good view with Glide, atraumatic oral intubation. SaO2 remained 100% throughout procedure.

## 2016-04-12 NOTE — Transfer of Care (Signed)
Immediate Anesthesia Transfer of Care Note  Patient: Larry [name]  Procedure(s) Performed: Procedure(s): COLOSTOMY REVERSAL (N/A)  Patient Location: PACU  Anesthesia Type:General  Level of Consciousness: awake, alert , oriented and patient cooperative  Airway & Oxygen Therapy: Patient Spontanous Breathing  Post-op Assessment: Report given to RN, Post -op Vital signs reviewed and stable and Patient moving all extremities X 4  Post vital signs: Reviewed and stable  Last Vitals:  Vitals:   04/12/16 0640 04/12/16 1007  BP: (!) 157/87   Pulse:    Resp:    Temp: 37.1 C 36.3 C    Last Pain:  Vitals:   04/12/16 0640  TempSrc: Oral      Patients Stated Pain Goal: 4 ([id number])  Complications: No apparent anesthesia complications

## 2016-04-12 NOTE — Anesthesia Postprocedure Evaluation (Signed)
Anesthesia Post Note  Patient: Konnor L Krock  Procedure(s) Performed: Procedure(s) (LRB): COLOSTOMY REVERSAL (N/A)  Patient location during evaluation: PACU Anesthesia Type: General Level of consciousness: awake and alert Pain management: pain level controlled Vital Signs Assessment: post-procedure vital signs reviewed and stable Respiratory status: spontaneous breathing, nonlabored ventilation, respiratory function stable and patient connected to nasal cannula oxygen Cardiovascular status: blood pressure returned to baseline and stable Postop Assessment: no signs of nausea or vomiting Anesthetic complications: no       Last Vitals:  Vitals:   04/12/16 1053 04/12/16 1112  BP: 131/85 131/86  Pulse: 97 98  Resp: 11 15  Temp:  36.3 C    Last Pain:  Vitals:   04/12/16 1154  TempSrc:   PainSc: 2                  [name]

## 2016-04-12 NOTE — Op Note (Signed)
OPERATIVE REPORT  DATE OF OPERATION: 04/12/2016  PATIENT:  Larry [name]  66 y.o. male  PRE-OPERATIVE DIAGNOSIS:  COLOSTOMY IN PLACE  POST-OPERATIVE DIAGNOSIS:  COLOSTOMY IN PLACE  INDICATION(S) FOR OPERATION:  Reversal of colostomy  FINDINGS:  Significant adhesions.  No evidence of tumor  PROCEDURE:  Procedure(s): COLOSTOMY REVERSAL  SURGEON:  Surgeon(s): Judeth Horn, MD Excell Seltzer, MD  ASSISTANT: Excell Seltzer, M.D.  ANESTHESIA:   general  COMPLICATIONS:  None  EBL: 50 ml  BLOOD ADMINISTERED: none  DRAINS: Urinary Catheter (Foley)   SPECIMEN:  Source of Specimen:  colostomy end  COUNTS CORRECT:  YES  PROCEDURE DETAILS: The patient was taken to the operating room and placed on the table in the supine position. After an adequate general endotracheal anesthetic was administered, the timeout was performed identifying the patient and procedure to be performed, then the colostomy was closed using running locking stitch of 2-0 silk.  The patient's abdomen was then prepped and draped in usual sterile manner. He had a wide midline scar from a previous fascial and skin dehiscence. This scar was excised leaving an opening approximately 5 cm wide by 18 cm long. Got down into the peritoneal cavity once we dissected the omentum away from the anterior abdominal wall. The omentum was covering the midline portion of the abdomen.  A significant amount of time was spent, at least an hour, taking down adhesions of small bowel from the colon where the Prolene marked the end of the mid transverse colon. This time was spent freeing up this area in order to perform the anastomosis with the colostomy. Once these adhesions were taken down we did take down the colostomy from its side in the right upper quadrant using electrocautery to cut out of the skin and attached from the surrounding subcutaneous tissue. Once it was detached well enough we passed it intraperitoneally and detached from the  anterior abdominal wall.  We took down the peritoneal attachments of the right colon and then came across the distal end of the ascending and right transverse colon using a GIA-75 stapler. Once we had this done we did a side to side anastomosis of the mid transverse colon to the ascending colon using a GIA-75 stapler. The resulting expected enterotomy was closed using a TX 60 stapler. The mesentery was not closed but there was minimal space for small bowel to herniate through.  Once this was done we irrigated with 2 L of saline solution. The surgeon and assistant then changed gown and glove. We subsequently closed. The colostomy site was closed using interrupted stitches of #1 Vicryl. Is at the fascial level. The midline incision was closed using running looped #1 PDS suture with intervening internal retention sutures of #1 Novafil figure-of-eight stitches. Once the colostomy site and the midline fascia was closed we closed the skin using stainless steel staples. The Penrose drain was brought out the right lateral corner of the colostomy site. A sterile dressing was applied. All needle counts sponge counts and instrument counts were correct.  PATIENT DISPOSITION:  PACU - hemodynamically stable.   Bram Hottel 2/20/20189:57 AM

## 2016-04-12 NOTE — Anesthesia Preprocedure Evaluation (Addendum)
Anesthesia Evaluation  Patient identified by MRN, date of birth, ID band Patient awake    Reviewed: Allergy & Precautions, H&P , NPO status , Patient's Chart, lab work & pertinent test results, reviewed documented beta blocker date and time   Airway Mallampati: IV  TM Distance: >3 FB Neck ROM: Full    Dental no notable dental hx. (+) Teeth Intact, Dental Advisory Given   Pulmonary asthma , sleep apnea and Continuous Positive Airway Pressure Ventilation , COPD,  COPD inhaler, former smoker,    Pulmonary exam normal breath sounds clear to auscultation       Cardiovascular hypertension, Pt. on medications and Pt. on home beta blockers + CAD, + Cardiac Stents, + Peripheral Vascular Disease and +CHF  + dysrhythmias (Atrial tachycardia)  Rhythm:Regular Rate:Normal  Stress Test 12/17: Nuclear stress EF: 58%. There was no ST segment deviation noted during stress. There is a large defect of severe severity present in the basal inferior, mid inferior, apical septal, apical inferior and apex location. The defect is non-reversible. No ischemia noted. Tthis is consistent with diaphragmatic attenuation artifact vs. Scar. This is a low risk study. The left ventricular ejection fraction is normal (55-65%).   Neuro/Psych negative neurological ROS  negative psych ROS   GI/Hepatic negative GI ROS, Neg liver ROS,   Endo/Other  Morbid obesity  Renal/GU Renal InsufficiencyRenal disease  negative genitourinary   Musculoskeletal  (+) Arthritis , Osteoarthritis,    Abdominal   Peds  Hematology  (+) Blood dyscrasia (Plavix therapy), anemia ,   Anesthesia Other Findings   Reproductive/Obstetrics negative OB ROS                           Anesthesia Physical  Anesthesia Plan  ASA: III  Anesthesia Plan: General   Post-op Pain Management:    Induction: Intravenous  Airway Management Planned: Oral ETT and  Video Laryngoscope Planned  Additional Equipment:   Intra-op Plan:   Post-operative Plan: Extubation in OR  Informed Consent: I have reviewed the patients History and Physical, chart, labs and discussed the procedure including the risks, benefits and alternatives for the proposed anesthesia with the patient or authorized representative who has indicated his/her understanding and acceptance.   Dental advisory given  Plan Discussed with: CRNA  Anesthesia Plan Comments: (Risks/benefits of general anesthesia discussed with patient including risk of damage to teeth, lips, gum, and tongue, nausea/vomiting, allergic reactions to medications, and the possibility of heart attack, stroke and death.  All patient questions answered.  Patient wishes to proceed.)       Anesthesia Quick Evaluation

## 2016-04-12 NOTE — H&P (Signed)
Larry [name] is an 66 y.o. male.   Chief Complaint: Colostomy in place HPI: Patient is several years out from an emergency colectomy with colostomy of the right transverse colon for an obstructing tumor.  He h as significant cardiac issue for which h e has been cleared.    He comes in today for reversal of his colostomy.  Past Medical History:  Diagnosis Date  . Arthritis   . Atrial tachycardia (Larry [name])    managed on beta blocker therapy  . Childhood asthma    "went away after I was 16"  . Chronic combined systolic and diastolic CHF, NYHA class 3 (HCC)    has diastolic heart failure grade 1; EF is 45 to 50% per echo 05/2011; EF 41% by Myoview 2016  . CKD (chronic kidney disease) stage 3, GFR 30-59 ml/min   . Colon cancer (Larry [name]) 2015   MSI high; IHC loss of MLH1 and PMS2; BRAF negative; Negative methylation  . COPD (chronic obstructive pulmonary disease) (Larry [name])   . Coronary artery disease   . Family history of ovarian cancer   . Hypercholesteremia   . Hypertension   . Noncompliance   . NSVT (nonsustained ventricular tachycardia) (HCC)    beta blocker restarted  . Obesity   . OSA on CPAP    used nightly, pt does not know settings  . Peripheral arterial disease (HCC)    4.2 cm thoracic aortic aneurysm per chest ct 11-14-15 epic  . S/P colostomy (Larry [name])    2014  . Thrombocytopenia (Larry [name])   . Torsades de pointes (Larry [name])    X 2 episodes during hospital visit 12'14"electrolyte imbalance"- "Shocked"    Past Surgical History:  Procedure Laterality Date  . COLON SURGERY    . COLONOSCOPY N/A 02/08/2013   Procedure: COLONOSCOPY;  Surgeon: [name], MD;  Location: [facility];  Service: Endoscopy;  Laterality: N/A;  . COLONOSCOPY WITH PROPOFOL N/A 05/09/2014   Procedure: COLONOSCOPY WITH PROPOFOL;  Surgeon: [name], MD;  Location: [facility];  Service: Endoscopy;  Laterality: N/A;  . COLONOSCOPY WITH PROPOFOL N/A 01/08/2016   Procedure: COLONOSCOPY WITH PROPOFOL;  Surgeon:  [name], MD;  Location: [facility];  Service: Endoscopy;  Laterality: N/A;  . CORONARY ANGIOPLASTY WITH STENT PLACEMENT  11/12/2008; 06/11/2014   stent x 2 to RCA; stent x 2 to LAD  . CORONARY STENT PLACEMENT  06/11/2014   m-LAD 3.5 x 16 mm Synergy DES, d-LAD  2.25 x 16 mm Synergy DES  . ESOPHAGOGASTRODUODENOSCOPY N/A 02/08/2013   Procedure: ESOPHAGOGASTRODUODENOSCOPY (EGD);  Surgeon: [name], MD;  Location: [facility] ENDOSCOPY;  Service: Endoscopy;  Laterality: N/A;  . FLEXIBLE SIGMOIDOSCOPY N/A 02/19/2016   Procedure: FLEXIBLE SIGMOIDOSCOPY;  Surgeon: [name], MD;  Location: [facility]OS[facility];  Service: Endoscopy;  Laterality: N/A;  . LAPAROTOMY N/A 02/12/2013   Procedure: EXPLORATORY LAPAROTOMY PARTIAL COLECTOMY WITH COLOSTOMY;  Surgeon: [name], MD;  Location: [facility];  Service: General;  Laterality: N/A;  . LAPAROTOMY N/A 02/18/2013   Procedure: EXPLORATORY LAPAROTOMY/Closure of Wound;  Surgeon: [name], MD;  Location: [facility];  Service: General;  Laterality: N/A;  . LEFT HEART CATHETERIZATION WITH CORONARY ANGIOGRAM N/A 06/11/2014   Procedure: LEFT HEART CATHETERIZATION WITH CORONARY ANGIOGRAM;  Surgeon: [name], MD; CFX calcified, 30-40 percent, RCA calcified, 40/50/40%, PDA diffuse disease, LAD 40/75/90% s/p DES 2     Family History  Problem Relation Age of Onset  . Hypertension Father   . Heart disease Father  before age 61  . Diabetes Father   . Hypertension Mother   . Dementia Mother   . Parkinsonism Mother   . Liver cancer Maternal Grandmother     dx in her 1s  . Prostate cancer Maternal Uncle   . Leukemia Maternal Uncle    Social History:  reports that he quit smoking about 7 years ago. His smoking use included Cigarettes. He has a 38.00 pack-year smoking history. He has never used smokeless tobacco. He reports that he drinks alcohol. He reports that he does not use drugs.  Allergies:  Allergies  Allergen Reactions  . No Known Allergies      Medications Prior to Admission  Medication Sig Dispense Refill  . aspirin EC 81 MG tablet Take 1 tablet (81 mg total) by mouth daily. 30 tablet 0  . atorvastatin (LIPITOR) 40 MG tablet TAKE 1 TABLET BY MOUTH DAILY 90 tablet 3  . clopidogrel (PLAVIX) 75 MG tablet TAKE 1 TABLET (75 MG TOTAL) BY MOUTH DAILY WITH BREAKFAST. 90 tablet 3  . ferrous sulfate 325 (65 FE) MG tablet Take 1 tablet (325 mg total) by mouth daily with breakfast. 30 tablet 1  . furosemide (LASIX) 40 MG tablet Take 1 tablet (40 mg total) by mouth daily. 90 tablet 3  . Magnesium 500 MG CAPS Take 500 mg by mouth daily.    . metoprolol tartrate (LOPRESSOR) 25 MG tablet Take 1.5 tablets (37.5 mg total) by mouth 2 (two) times daily. 90 tablet 6  . Multiple Vitamins-Minerals (MULTIVITAMIN WITH MINERALS) tablet Take 1 tablet by mouth daily.    . nitroGLYCERIN (NITROSTAT) 0.4 MG SL tablet Place 0.4 mg under the tongue every 5 (five) minutes as needed for chest pain.    . potassium chloride SA (KLOR-CON M20) 20 MEQ tablet Take 1 tablet (20 mEq total) by mouth 2 (two) times daily. 180 tablet 3  . Simethicone 125 MG TABS Take 125 mg by mouth 4 (four) times daily.    [name] Kitchen spironolactone (ALDACTONE) 25 MG tablet TAKE 1 TABLET (25 MG TOTAL) BY MOUTH DAILY. 90 tablet 2  . verapamil (CALAN-SR) 240 MG CR tablet Take 1 tablet (240 mg total) by mouth daily. 30 tablet 6  . levalbuterol (XOPENEX HFA) 45 MCG/ACT inhaler INHALE 2 PUFFS INTO THE LUNGS EVERY 4 (FOUR) HOURS AS NEEDED FOR WHEEZING. 15 Inhaler 3  . sildenafil (VIAGRA) 100 MG tablet Take 0.5 tablets (50 mg total) by mouth daily as needed for erectile dysfunction. 10 tablet 3    No results found for this or any previous visit (from the past 48 hour(s)). No results found.  Review of Systems  All other systems reviewed and are negative.   Blood pressure (!) 157/87, pulse 96, temperature 98.7 F (37.1 C), temperature source Oral, resp. rate 20, height 5' 10.5" (1.791 m), weight (!) 171  kg (377 lb), SpO2 98 %. Physical Exam  Nursing note and vitals reviewed. Constitutional: He is oriented to person, place, and time. He appears well-developed.  Morbidly obese  HENT:  Head: Normocephalic and atraumatic.  Eyes: EOM are normal. Pupils are equal, round, and reactive to light.  Neck: Normal range of motion. Neck supple.  Cardiovascular: Normal rate and regular rhythm.   Murmur (Grade II/VI at the LLSB) heard. Respiratory: Effort normal and breath sounds normal.  GI: Soft. Bowel sounds are normal.  Musculoskeletal: Normal range of motion.  Neurological: He is alert and oriented to person, place, and time. He has normal reflexes.  Skin: Skin  is warm and dry.  Psychiatric: He has a normal mood and affect. His behavior is normal. Judgment and thought content normal.     Assessment/Plan Patient being brought in for colostomy reversal Cardiac clearance is good and he had colonoscopy for  Hartman's pouch evalaution. He got Entereg preoperatively.  Judeth Horn, MD 04/12/2016, 7:22 AM

## 2016-04-13 ENCOUNTER — Encounter (HOSPITAL_COMMUNITY): Payer: Self-pay | Admitting: General Surgery

## 2016-04-13 LAB — CBC
HCT: 41.5 % (ref 39.0–52.0)
HEMOGLOBIN: 12.9 g/dL — AB (ref 13.0–17.0)
MCH: 27.8 pg (ref 26.0–34.0)
MCHC: 31.1 g/dL (ref 30.0–36.0)
MCV: 89.4 fL (ref 78.0–100.0)
PLATELETS: 154 10*3/uL (ref 150–400)
RBC: 4.64 MIL/uL (ref 4.22–5.81)
RDW: 15.2 % (ref 11.5–15.5)
WBC: 11 10*3/uL — AB (ref 4.0–10.5)

## 2016-04-13 LAB — BASIC METABOLIC PANEL
ANION GAP: 11 (ref 5–15)
BUN: 16 mg/dL (ref 6–20)
CALCIUM: 9.1 mg/dL (ref 8.9–10.3)
CHLORIDE: 102 mmol/L (ref 101–111)
CO2: 23 mmol/L (ref 22–32)
Creatinine, Ser: 2.1 mg/dL — ABNORMAL HIGH (ref 0.61–1.24)
GFR calc non Af Amer: 32 mL/min — ABNORMAL LOW (ref >60)
GFR, EST AFRICAN AMERICAN: 37 mL/min — AB (ref >60)
Glucose, Bld: 116 mg/dL — ABNORMAL HIGH (ref 65–99)
Potassium: 4.3 mmol/L (ref 3.5–5.1)
SODIUM: 136 mmol/L (ref 135–145)

## 2016-04-13 NOTE — Progress Notes (Signed)
1600 Pt was sitting up in the recliner, max x2 assist with transfers. Assisted pt to stand up to try to void but pt unable to urinate. Bladder scanned for 136ml only. Dr Hulen Skains notified. Pt has no urge to urinate. 1645 Foley cath inserted w/o difficulty, dark amber urine noted.

## 2016-04-13 NOTE — Progress Notes (Signed)
GS Progress Note Subjective: Patient up in a chair.  Sweating.  Not SOB.  Abdomen distended.  Foley out.  WBC 11K  Hct 41.  Has been making good urine  Objective: Vital signs in last 24 hours: Temp:  [97.6 F (36.4 C)-99.9 F (37.7 C)] 99.2 F (37.3 C) (02/21 0926) Pulse Rate:  [98-121] 117 (02/21 0926) Resp:  [12-20] 18 (02/21 1140) BP: (117-143)/(62-88) 139/75 (02/21 0926) SpO2:  [93 %-99 %] 93 % (02/21 1140)    Intake/Output from previous day: 02/20 0701 - 02/21 0700 In: 3486.7 [P.O.:420; I.V.:3016.7; IV Piggyback:50] Out: 2130 [Urine:2030; Blood:100] Intake/Output this shift: No intake/output data recorded.  Lungs: Clear and using IS  Abd: Distended, few bowel sounds.  On PCA and Entereg.  Extremities: No DVT signs or symptoms.  Neuro: Intact  Lab Results: CBC   Recent Labs  04/13/16 0527  WBC 11.0*  HGB 12.9*  HCT 41.5  PLT 154   BMET  Recent Labs  04/13/16 0527  NA 136  K 4.3  CL 102  CO2 23  GLUCOSE 116*  BUN 16  CREATININE 2.10*  CALCIUM 9.1   PT/INR No results for input(s): LABPROT, INR in the last 72 hours. ABG No results for input(s): PHART, HCO3 in the last 72 hours.  Invalid input(s): PCO2, PO2  Studies/Results: No results found.  Anti-infectives: Anti-infectives    Start     Dose/Rate Route Frequency Ordered Stop   04/12/16 2000  cefoTEtan (CEFOTAN) 2 g in dextrose 5 % 50 mL IVPB     2 g 100 mL/hr over 30 Minutes Intravenous Every 12 hours 04/12/16 1120 04/12/16 2125   04/12/16 0700  cefoTEtan (CEFOTAN) 2 g in dextrose 5 % 50 mL IVPB     2 g 100 mL/hr over 30 Minutes Intravenous To ShortStay Surgical 04/11/16 1404 04/12/16 0753   04/12/16 0548  cefoTEtan in Dextrose 5% (CEFOTAN) 2-2.08 GM-% IVPB    Comments:  Precious Haws   : cabinet override      04/12/16 0548 04/12/16 1759   04/12/16 0544  neomycin (MYCIFRADIN) tablet 1,000 mg  Status:  Discontinued     1,000 mg Oral 3 times per day 04/12/16 0544 04/12/16 0547   04/12/16  0544  metroNIDAZOLE (FLAGYL) tablet 1,000 mg  Status:  Discontinued     1,000 mg Oral 3 times per day 04/12/16 0544 04/12/16 0547      Assessment/Plan: s/p Procedure(s): COLOSTOMY REVERSAL d/c foley Because of history of heart failure and pulmonary disease will decrease IVFs a bit.  LOS: 1 day    Kathryne Eriksson. Dahlia Bailiff, MD, FACS [phone] [facility] 04/13/2016

## 2016-04-13 NOTE — Care Management Note (Signed)
Case Management Note  Patient Details  Name: Larry [name] MRN: [id number] Date of Birth: [birth year]  Subjective/Objective:                    Action/Plan:  Reversal of colostomy , will follow for needs Expected Discharge Date:                  Expected Discharge Plan:  Home/Self Care  In-House Referral:     Discharge planning Services     Post Acute Care Choice:    Choice offered to:     DME Arranged:    DME Agency:     HH Arranged:    [facility] Agency:     Status of Service:  In process, will continue to follow  If discussed at Long Length of Stay Meetings, dates discussed:    Additional Comments:  Marilu Favre, RN 04/13/2016, 11:21 AM

## 2016-04-14 LAB — CBC WITH DIFFERENTIAL/PLATELET
BASOS ABS: 0 10*3/uL (ref 0.0–0.1)
BASOS PCT: 0 %
EOS ABS: 0 10*3/uL (ref 0.0–0.7)
Eosinophils Relative: 0 %
HEMATOCRIT: 38.8 % — AB (ref 39.0–52.0)
Hemoglobin: 12 g/dL — ABNORMAL LOW (ref 13.0–17.0)
Lymphocytes Relative: 8 %
Lymphs Abs: 0.9 10*3/uL (ref 0.7–4.0)
MCH: 27.5 pg (ref 26.0–34.0)
MCHC: 30.9 g/dL (ref 30.0–36.0)
MCV: 88.8 fL (ref 78.0–100.0)
Monocytes Absolute: 1.7 10*3/uL — ABNORMAL HIGH (ref 0.1–1.0)
Monocytes Relative: 15 %
NEUTROS ABS: 8.8 10*3/uL — AB (ref 1.7–7.7)
Neutrophils Relative %: 77 %
PLATELETS: 144 10*3/uL — AB (ref 150–400)
RBC: 4.37 MIL/uL (ref 4.22–5.81)
RDW: 15.1 % (ref 11.5–15.5)
WBC: 11.4 10*3/uL — ABNORMAL HIGH (ref 4.0–10.5)

## 2016-04-14 LAB — BASIC METABOLIC PANEL
ANION GAP: 10 (ref 5–15)
BUN: 22 mg/dL — ABNORMAL HIGH (ref 6–20)
CALCIUM: 9.5 mg/dL (ref 8.9–10.3)
CO2: 23 mmol/L (ref 22–32)
Chloride: 104 mmol/L (ref 101–111)
Creatinine, Ser: 2.44 mg/dL — ABNORMAL HIGH (ref 0.61–1.24)
GFR, EST AFRICAN AMERICAN: 31 mL/min — AB (ref >60)
GFR, EST NON AFRICAN AMERICAN: 27 mL/min — AB (ref >60)
Glucose, Bld: 106 mg/dL — ABNORMAL HIGH (ref 65–99)
Potassium: 4.2 mmol/L (ref 3.5–5.1)
SODIUM: 137 mmol/L (ref 135–145)

## 2016-04-14 MED ORDER — TAMSULOSIN HCL 0.4 MG PO CAPS
0.4000 mg | ORAL_CAPSULE | Freq: Every day | ORAL | Status: DC
  Administered 2016-04-14 – 2016-04-18 (×4): 0.4 mg via ORAL
  Filled 2016-04-14 (×4): qty 1

## 2016-04-14 MED ORDER — BETHANECHOL CHLORIDE 10 MG PO TABS
10.0000 mg | ORAL_TABLET | Freq: Three times a day (TID) | ORAL | Status: DC
  Administered 2016-04-14 – 2016-04-18 (×12): 10 mg via ORAL
  Filled 2016-04-14 (×18): qty 1

## 2016-04-14 NOTE — Evaluation (Signed)
Physical Therapy Evaluation Patient Details Name: Larry [name] MRN: [id number] DOB: 06/12/1950 Today's Date: 04/14/2016   History of Present Illness  Pt is a 66 yo male several years out from an emergency colectomy with colostomy of the right transverse colon for an obstructing tumor. Pt with significant cardiac issue for which he has been cleared.    s/p 04/12/16 colostomy reversal   Clinical Impression  Pt decreased LE strength and increased pain is currently limiting his mobility in transfers and ambulation. Pt was independent in his home environment prior to surgery and with progression of LE strength should be able to return with supervision. Pt requires skilled PT for transfer and gait training as well as increasing LE strength and endurance to safely be able to return home to his home environment. PT current recommendation is home with HHPT.        Follow Up Recommendations Home health PT;Supervision/Assistance - 24 hour;Supervision for mobility/OOB    Equipment Recommendations  Rolling walker with 5" wheels;3in1 (PT)    Recommendations for Other Services       Precautions / Restrictions Precautions Precautions: Fall Precaution Comments: deconditioned Restrictions Weight Bearing Restrictions: No      Mobility  Bed Mobility Overal bed mobility: Needs Assistance Bed Mobility: Sit to Supine       Sit to supine: +2 for physical assistance   General bed mobility comments: required max assist for LE management into bed   Transfers Overall transfer level: Needs assistance Equipment used: Rolling walker (2 wheeled) Transfers: Sit to/from Stand (stand step trasfer recliner to bed) Sit to Stand: +2 physical assistance         General transfer comment: pt in recliner at entry required max A x 2 for sit>stand at RW unable to push up through LE. Pt able to take 5 steps with RW and modAx2 to bed.  Ambulation/Gait Ambulation/Gait assistance: Mod assist;+2 physical  assistance Ambulation Distance (Feet): 2 Feet Assistive device: Rolling walker (2 wheeled) Gait Pattern/deviations: Step-to pattern;Decreased step length - right;Decreased step length - left;Trunk flexed;Shuffle Gait velocity: decreased Gait velocity interpretation: Below normal speed for age/gender General Gait Details: 5 steps to bed from recliner, no LoB, no buckling     Balance Overall balance assessment: Needs assistance Sitting-balance support: No upper extremity supported;Feet supported Sitting balance-Leahy Scale: Fair Sitting balance - Comments: able to sit EoB with hands in lap for MMT    Standing balance support: Bilateral upper extremity supported Standing balance-Leahy Scale: Poor Standing balance comment: able to stand 3 min with no LoB, no buckling                             Pertinent Vitals/Pain Pain Assessment: 0-10 Pain Score: 4  Pain Location: abdominal inscision site Pain Descriptors / Indicators: Grimacing;Tender;Constant;Burning Pain Intervention(s): Limited activity within patient's tolerance;Monitored during session;PCA encouraged  Currently on 1.5L O2 via nasal cannula however not on O2 therapy at home HR 108 bpm and SaO2 97% at rest HR 128 bpm maximum and SaO2 88% minimum with activity    Home Living Family/patient expects to be discharged to:: Private residence Living Arrangements: Spouse/significant other Available Help at Discharge: Family Type of Home: House       Home Layout: Two level;Able to live on main level with bedroom/bathroom Home Equipment: None      Prior Function Level of Independence: Independent         Comments: limited community ambulator  Hand Dominance        Extremity/Trunk Assessment   Upper Extremity Assessment Upper Extremity Assessment: Defer to OT evaluation    Lower Extremity Assessment Lower Extremity Assessment: LLE deficits/detail LLE Deficits / Details: 3-/5 hip strength  bilaterally, remainer of LE 3/5 LLE: Unable to fully assess due to pain       Communication   Communication: No difficulties  Cognition Arousal/Alertness: Awake/alert Behavior During Therapy: WFL for tasks assessed/performed Overall Cognitive Status: Within Functional Limits for tasks assessed                      General Comments General comments (skin integrity, edema, etc.): pt on 1.5L O2 maintained SaO2 above 90% throughout evaluation             PT Assessment Patient needs continued PT services  PT Problem List Decreased strength;Decreased activity tolerance;Decreased mobility;Decreased knowledge of use of DME;Pain;Obesity       PT Treatment Interventions Gait training;DME instruction;Stair training;Functional mobility training;Therapeutic exercise;Patient/family education    PT Goals (Current goals can be found in the Care Plan section)  Acute Rehab PT Goals Patient Stated Goal: go home soon PT Goal Formulation: With patient Time For Goal Achievement: 04/28/16 Potential to Achieve Goals: Fair    Frequency Min 3X/week   Barriers to discharge        Co-evaluation               End of Session Equipment Utilized During Treatment: Gait belt;Oxygen (1.5 L O2) Activity Tolerance: Patient limited by fatigue;Patient limited by pain Patient left: in bed;with call bell/phone within reach;with nursing/sitter in room Nurse Communication: Mobility status;Precautions PT Visit Diagnosis: Other abnormalities of gait and mobility (R26.89);Muscle weakness (generalized) (M62.81);Pain         Time: [id number] PT Time Calculation (min) (ACUTE ONLY): 25 min   Charges:   PT Evaluation $PT Eval Low Complexity: 1 Procedure PT Treatments $Therapeutic Activity: 8-22 mins   PT G Codes:         Bailey Mech Fleet 04/14/2016, 3:31 PM  Benjamine Mola B. Migdalia Dk PT, DPT Acute Rehabilitation  [phone]

## 2016-04-14 NOTE — Progress Notes (Signed)
Patient had scant emesis mostly saliva.  Administered PRN Zofran 4mg  Inj per order. Will monitor.

## 2016-04-14 NOTE — Progress Notes (Signed)
[facility]/Larry [name] Progress Note 2 Days Post-Op  Subjective: Patient in bed, not diaphoretic.  Not passing any faltus or having any bowel movements  Objective: Vital signs in last 24 hours: Temp:  [97.7 F (36.5 C)-99.5 F (37.5 C)] 98.6 F (37 C) (02/22 0539) Pulse Rate:  [100-121] 117 (02/22 0539) Resp:  [12-20] 18 (02/22 0539) BP: (139-153)/(75-96) 148/88 (02/22 0539) SpO2:  [93 %-98 %] 94 % (02/22 0539)    Intake/Output from previous day: 02/21 0701 - 02/22 0700 In: 2150 [P.O.:180; I.V.:1970] Out: 1115 [Urine:1115] Intake/Output this shift: No intake/output data recorded.  General: No acute distress., but seems very deconditioned  Lungs: Clear to auscultation.  Oxygen saturations 94-96%.  IS only 750  Abd: Distended with hypoactive bowel sounds.  Dressing changed.  Penrose coming from the lateral corner of the ostomy closure site.  Extremities: No changes  Neuro: Intact  Lab Results:  @LABLAST2 (wbc:2,hgb:2,hct:2,plt:2) BMET ) Recent Labs  04/13/16 0527 04/14/16 0452  NA 136 137  K 4.3 4.2  CL 102 104  CO2 23 23  GLUCOSE 116* 106*  BUN 16 22*  CREATININE 2.10* 2.44*  CALCIUM 9.1 9.5   PT/INR No results for input(s): LABPROT, INR in the last 72 hours. ABG No results for input(s): PHART, HCO3 in the last 72 hours.  Invalid input(s): PCO2, PO2  Studies/Results: No results found.  Anti-infectives: Anti-infectives    Start     Dose/Rate Route Frequency Ordered Stop   04/12/16 2000  cefoTEtan (CEFOTAN) 2 g in dextrose 5 % 50 mL IVPB     2 g 100 mL/hr over 30 Minutes Intravenous Every 12 hours 04/12/16 1120 04/12/16 2125   04/12/16 0700  cefoTEtan (CEFOTAN) 2 g in dextrose 5 % 50 mL IVPB     2 g 100 mL/hr over 30 Minutes Intravenous To ShortStay Surgical 04/11/16 1404 04/12/16 0753   04/12/16 0548  cefoTEtan in Dextrose 5% (CEFOTAN) 2-2.08 GM-% IVPB    Comments:  Larry [name]   : cabinet override      04/12/16 0548 04/12/16 1759   04/12/16 0544  neomycin  (MYCIFRADIN) tablet 1,000 mg  Status:  Discontinued     1,000 mg Oral 3 times per day 04/12/16 0544 04/12/16 0547   04/12/16 0544  metroNIDAZOLE (FLAGYL) tablet 1,000 mg  Status:  Discontinued     1,000 mg Oral 3 times per day 04/12/16 0544 04/12/16 0547      Assessment/Plan: s/p Procedure(s): COLOSTOMY REVERSAL Continue foley due to acute urinary retention and Will try some flomax and urecholine. Get physical therapy for patient.  Will not advance diet yet.  LOS: 2 days   Kathryne Eriksson. Dahlia Bailiff, MD, FACS [phone] [facility] 04/14/2016

## 2016-04-15 ENCOUNTER — Inpatient Hospital Stay (HOSPITAL_COMMUNITY): Payer: Medicare Other

## 2016-04-15 LAB — BASIC METABOLIC PANEL
Anion gap: 12 (ref 5–15)
BUN: 28 mg/dL — ABNORMAL HIGH (ref 6–20)
CO2: 23 mmol/L (ref 22–32)
Calcium: 9.8 mg/dL (ref 8.9–10.3)
Chloride: 103 mmol/L (ref 101–111)
Creatinine, Ser: 2.26 mg/dL — ABNORMAL HIGH (ref 0.61–1.24)
GFR calc Af Amer: 34 mL/min — ABNORMAL LOW (ref >60)
GFR, EST NON AFRICAN AMERICAN: 29 mL/min — AB (ref >60)
GLUCOSE: 143 mg/dL — AB (ref 65–99)
POTASSIUM: 4.4 mmol/L (ref 3.5–5.1)
Sodium: 138 mmol/L (ref 135–145)

## 2016-04-15 MED ORDER — SODIUM CHLORIDE 0.9 % IV BOLUS (SEPSIS)
1000.0000 mL | Freq: Once | INTRAVENOUS | Status: AC
  Administered 2016-04-15: 1000 mL via INTRAVENOUS

## 2016-04-15 NOTE — Progress Notes (Signed)
TC from CCMD re: pt.'s HR sustaining at 140 and pt. Checked and sleeping, awaken and states that he feels fine and no distress noted. Dr. Kieth Brightly paged 2012 and RTC 2024 and orders given.

## 2016-04-15 NOTE — Care Management Note (Addendum)
Case Management Note  Patient Details  Name: Larry [name] MRN: [id number] Date of Birth: 1950/12/11  Subjective/Objective:         Addendum 3/13 Carles Collet RN CM.  Per MD note:"SIGNIFICANT EVENTS: 02/20 - Admit for colostomy reversal 02/23 - Postop ileus & tachycardia 02/24 - Cardiology Consulted 02/27 - OR for Exploratory Laparotomy finding anastomotic leak & peritonitis  3/2   transitioned to Precedex infusion and  fentanyl drip, febrile Subjective:   No events overnight, remains unresponsive but more agitated with wean"  Chart indicates patient trached 3/13. CM will continue to follow for DC planning.            Action/Plan:   Expected Discharge Date:                  Expected Discharge Plan:    In-House Referral:     Discharge planning Services  CM Consult  Post Acute Care Choice:  Home Health Choice offered to:     DME Arranged:  Walker rolling, 3-N-1 DME Agency:  [place]:  PT [facility] Agency:     Status of Service:  In process, will continue to follow  If discussed at Long Length of Stay Meetings, dates discussed:    Additional Comments:  Marilu Favre, RN 04/15/2016, 1:52 PM

## 2016-04-15 NOTE — Progress Notes (Signed)
GS Progress Note Subjective: Patient markedly distended this AM.  Tachycardic.  Very uncomfortable   Objective: Vital signs in last 24 hours: Temp:  [97.7 F (36.5 C)-98.7 F (37.1 C)] 98.4 F (36.9 C) (02/23 0431) Pulse Rate:  [102-123] 123 (02/23 0431) Resp:  [14-18] 18 (02/23 0431) BP: (144-166)/(83-99) 154/99 (02/23 0431) SpO2:  [94 %-99 %] 99 % (02/23 0431)    Intake/Output from previous day: 02/22 0701 - 02/23 0700 In: 1821.6 [P.O.:480; I.V.:1341.6] Out: 1875 [Urine:1875] Intake/Output this shift: No intake/output data recorded.  Lungs: clear  Abd: distended, hypoactive bowel sounds.  Extremities: No clinical signs or symptoms of DVT  Neuro: Intact  Lab Results: CBC   Recent Labs  04/13/16 0527 04/14/16 0452  WBC 11.0* 11.4*  HGB 12.9* 12.0*  HCT 41.5 38.8*  PLT 154 144*   BMET  Recent Labs  04/14/16 0452 04/15/16 0459  NA 137 138  K 4.2 4.4  CL 104 103  CO2 23 23  GLUCOSE 106* 143*  BUN 22* 28*  CREATININE 2.44* 2.26*  CALCIUM 9.5 9.8   PT/INR No results for input(s): LABPROT, INR in the last 72 hours. ABG No results for input(s): PHART, HCO3 in the last 72 hours.  Invalid input(s): PCO2, PO2  Studies/Results: No results found.  Anti-infectives: Anti-infectives    Start     Dose/Rate Route Frequency Ordered Stop   04/12/16 2000  cefoTEtan (CEFOTAN) 2 g in dextrose 5 % 50 mL IVPB     2 g 100 mL/hr over 30 Minutes Intravenous Every 12 hours 04/12/16 1120 04/12/16 2125   04/12/16 0700  cefoTEtan (CEFOTAN) 2 g in dextrose 5 % 50 mL IVPB     2 g 100 mL/hr over 30 Minutes Intravenous To ShortStay Surgical 04/11/16 1404 04/12/16 0753   04/12/16 0548  cefoTEtan in Dextrose 5% (CEFOTAN) 2-2.08 GM-% IVPB    Comments:  Precious Haws   : cabinet override      04/12/16 0548 04/12/16 1759   04/12/16 0544  neomycin (MYCIFRADIN) tablet 1,000 mg  Status:  Discontinued     1,000 mg Oral 3 times per day 04/12/16 0544 04/12/16 0547   04/12/16 0544   metroNIDAZOLE (FLAGYL) tablet 1,000 mg  Status:  Discontinued     1,000 mg Oral 3 times per day 04/12/16 0544 04/12/16 0547      Assessment/Plan: s/p Procedure(s): COLOSTOMY REVERSAL Adynamic postoperative ileus  NGT. NPO   LOS: 3 days    Kathryne Eriksson. Dahlia Bailiff, MD, FACS [phone] [facility] 04/15/2016

## 2016-04-15 NOTE — Progress Notes (Signed)
PT Cancellation Note  Patient Details Name: FILMORE FINNEY MRN: [id number] DOB: 06/20/1950   Cancelled Treatment:    Reason Eval/Treat Not Completed: Other (comment) (Nurse deferred in am; Pt refused PT in pm due to fatigue. )Thanks.    Denice Paradise 04/15/2016, 2:39 PM [facility] Acute Rehabilitation [phone] (pager)

## 2016-04-15 NOTE — Progress Notes (Signed)
Dr. Chucky May made aware of patient's sustained PR 123-128, BP 154/99. Dr. Hulen Skains called back and to give now the beta blocker which will be due at 1000.

## 2016-04-15 NOTE — Progress Notes (Signed)
Patient's sustain PR 123-128 for 1 hr., BP 154/99, T 98.4 oral, RR 18, O2Sat=99% at 2L/min via [place] and pain 5/10 but patient sometime moans. Encouraged PCA use.   On-call CCS, Dr. Mamie Laurel made aware thru page.

## 2016-04-15 NOTE — Progress Notes (Signed)
HR had been 132-135 pretty much since around 2145-2200, and 30 min. Ago increased to 138- 140 and sustaining at 140; paged Dr. Kieth Brightly again at this time and orders given to get stat EKG and call him back.

## 2016-04-16 ENCOUNTER — Inpatient Hospital Stay (HOSPITAL_COMMUNITY): Payer: Medicare Other

## 2016-04-16 DIAGNOSIS — R000 Tachycardia, unspecified: Secondary | ICD-10-CM

## 2016-04-16 DIAGNOSIS — I10 Essential (primary) hypertension: Secondary | ICD-10-CM

## 2016-04-16 DIAGNOSIS — I2510 Atherosclerotic heart disease of native coronary artery without angina pectoris: Secondary | ICD-10-CM

## 2016-04-16 LAB — CBC WITH DIFFERENTIAL/PLATELET
BASOS ABS: 0 10*3/uL (ref 0.0–0.1)
Basophils Relative: 0 %
Eosinophils Absolute: 0.1 10*3/uL (ref 0.0–0.7)
Eosinophils Relative: 1 %
HCT: 36.5 % — ABNORMAL LOW (ref 39.0–52.0)
HEMOGLOBIN: 11.2 g/dL — AB (ref 13.0–17.0)
LYMPHS ABS: 0.8 10*3/uL (ref 0.7–4.0)
LYMPHS PCT: 8 %
MCH: 27.7 pg (ref 26.0–34.0)
MCHC: 30.7 g/dL (ref 30.0–36.0)
MCV: 90.3 fL (ref 78.0–100.0)
Monocytes Absolute: 1.3 10*3/uL — ABNORMAL HIGH (ref 0.1–1.0)
Monocytes Relative: 14 %
NEUTROS PCT: 77 %
Neutro Abs: 7.3 10*3/uL (ref 1.7–7.7)
Platelets: 156 10*3/uL (ref 150–400)
RBC: 4.04 MIL/uL — AB (ref 4.22–5.81)
RDW: 15.2 % (ref 11.5–15.5)
WBC: 9.4 10*3/uL (ref 4.0–10.5)

## 2016-04-16 LAB — BASIC METABOLIC PANEL
ANION GAP: 9 (ref 5–15)
BUN: 33 mg/dL — AB (ref 6–20)
CHLORIDE: 105 mmol/L (ref 101–111)
CO2: 35 mmol/L — ABNORMAL HIGH (ref 22–32)
Calcium: 9.5 mg/dL (ref 8.9–10.3)
Creatinine, Ser: 2.08 mg/dL — ABNORMAL HIGH (ref 0.61–1.24)
GFR calc Af Amer: 37 mL/min — ABNORMAL LOW (ref >60)
GFR, EST NON AFRICAN AMERICAN: 32 mL/min — AB (ref >60)
GLUCOSE: 109 mg/dL — AB (ref 65–99)
POTASSIUM: 3.9 mmol/L (ref 3.5–5.1)
SODIUM: 149 mmol/L — AB (ref 135–145)

## 2016-04-16 MED ORDER — SODIUM CHLORIDE 0.9 % IV SOLN
500.0000 mL | Freq: Once | INTRAVENOUS | Status: AC
  Administered 2016-04-16: 500 mL via INTRAVENOUS

## 2016-04-16 MED ORDER — ASPIRIN 300 MG RE SUPP
300.0000 mg | Freq: Every day | RECTAL | Status: DC
  Administered 2016-04-16 – 2016-04-23 (×8): 300 mg via RECTAL
  Filled 2016-04-16 (×9): qty 1

## 2016-04-16 MED ORDER — PERFLUTREN LIPID MICROSPHERE
1.0000 mL | INTRAVENOUS | Status: AC | PRN
  Administered 2016-04-16 (×2): 2 mL via INTRAVENOUS
  Filled 2016-04-16: qty 10

## 2016-04-16 MED ORDER — METOPROLOL TARTRATE 5 MG/5ML IV SOLN
10.0000 mg | Freq: Four times a day (QID) | INTRAVENOUS | Status: DC | PRN
  Administered 2016-04-16 – 2016-04-26 (×10): 10 mg via INTRAVENOUS
  Administered 2016-04-30 (×3): 5 mg via INTRAVENOUS
  Administered 2016-04-30: 10 mg via INTRAVENOUS
  Administered 2016-05-01 (×4): 5 mg via INTRAVENOUS
  Filled 2016-04-16 (×18): qty 10

## 2016-04-16 MED ORDER — METOPROLOL TARTRATE 5 MG/5ML IV SOLN
5.0000 mg | Freq: Four times a day (QID) | INTRAVENOUS | Status: DC | PRN
  Administered 2016-04-16 (×2): 5 mg via INTRAVENOUS
  Filled 2016-04-16 (×3): qty 5

## 2016-04-16 MED ORDER — METOPROLOL TARTRATE 5 MG/5ML IV SOLN
INTRAVENOUS | Status: AC
  Filled 2016-04-16: qty 5

## 2016-04-16 MED ORDER — SODIUM CHLORIDE 0.9 % IV BOLUS (SEPSIS): 1000.0000 mL | Freq: Once | INTRAVENOUS | Status: DC

## 2016-04-16 MED ORDER — SODIUM CHLORIDE 0.9 % IV BOLUS (SEPSIS)
1000.0000 mL | Freq: Once | INTRAVENOUS | Status: AC
  Administered 2016-04-16: 1000 mL via INTRAVENOUS

## 2016-04-16 MED ORDER — METOPROLOL TARTRATE 5 MG/5ML IV SOLN
5.0000 mg | Freq: Four times a day (QID) | INTRAVENOUS | Status: DC
  Administered 2016-04-16 – 2016-04-17 (×4): 5 mg via INTRAVENOUS
  Filled 2016-04-16 (×4): qty 5

## 2016-04-16 NOTE — Progress Notes (Signed)
PRN medication given for sustained elevated HR, 130s. Patient A&O, denies related c/o. Amion message to on call MD, response pending.

## 2016-04-16 NOTE — Progress Notes (Signed)
GS Progress Note Subjective: Patient significantly more tachycardic today, but pain is much better.  Making urine.    Objective: Vital signs in last 24 hours: Temp:  [97.5 F (36.4 C)-98.7 F (37.1 C)] 98.7 F (37.1 C) (02/24 0948) Pulse Rate:  [110-141] 141 (02/24 1024) Resp:  [11-18] 16 (02/24 0948) BP: (129-152)/(60-93) 142/93 (02/24 0948) SpO2:  [92 %-100 %] 96 % (02/24 0948)    Intake/Output from previous day: 02/23 0701 - 02/24 0700 In: 180 [P.O.:180] Out: 10000 [Urine:1500; Emesis/NG output:8500] Intake/Output this shift: No intake/output data recorded.  Lungs: Clear  Abd: Still distended, but much less than before.  Put out a whooping 9 liters from NGT yesterday.  Extremities: No changes  Neuro: Intact  Lab Results: CBC   Recent Labs  04/14/16 0452  WBC 11.4*  HGB 12.0*  HCT 38.8*  PLT 144*   BMET  Recent Labs  04/14/16 0452 04/15/16 0459  NA 137 138  K 4.2 4.4  CL 104 103  CO2 23 23  GLUCOSE 106* 143*  BUN 22* 28*  CREATININE 2.44* 2.26*  CALCIUM 9.5 9.8   PT/INR No results for input(s): LABPROT, INR in the last 72 hours. ABG No results for input(s): PHART, HCO3 in the last 72 hours.  Invalid input(s): PCO2, PO2  Studies/Results: Dg Abd Portable 1v  Result Date: 04/15/2016 CLINICAL DATA:  NG tube placement. EXAM: PORTABLE ABDOMEN - 1 VIEW COMPARISON:  02/23/2013. FINDINGS: NG tube noted with tip in the upper portion stomach. Advancement of approximately 8 cm should be considered. Surgical staples are noted over the abdomen. Distended loops of bowel including small bowel are noted. Follow-up exams suggested to demonstrate resolution. IMPRESSION: 1. NG tube tip noted in the upper portion of the stomach. Advancement of approximately 8 cm should be considered. 2. Persistent distended loops of bowel including small bowel. Follow-up exam suggested to demonstrate resolution. Electronically Signed   By: Marcello Moores  Register   On: 04/15/2016 09:18     Anti-infectives: Anti-infectives    Start     Dose/Rate Route Frequency Ordered Stop   04/12/16 2000  cefoTEtan (CEFOTAN) 2 g in dextrose 5 % 50 mL IVPB     2 g 100 mL/hr over 30 Minutes Intravenous Every 12 hours 04/12/16 1120 04/12/16 2125   04/12/16 0700  cefoTEtan (CEFOTAN) 2 g in dextrose 5 % 50 mL IVPB     2 g 100 mL/hr over 30 Minutes Intravenous To ShortStay Surgical 04/11/16 1404 04/12/16 0753   04/12/16 0548  cefoTEtan in Dextrose 5% (CEFOTAN) 2-2.08 GM-% IVPB    Comments:  Precious Haws   : cabinet override      04/12/16 0548 04/12/16 1759   04/12/16 0544  neomycin (MYCIFRADIN) tablet 1,000 mg  Status:  Discontinued     1,000 mg Oral 3 times per day 04/12/16 0544 04/12/16 0547   04/12/16 0544  metroNIDAZOLE (FLAGYL) tablet 1,000 mg  Status:  Discontinued     1,000 mg Oral 3 times per day 04/12/16 0544 04/12/16 0547      Assessment/Plan: s/p Procedure(s): [place] [facility] consultation for tachycardia  Bolus with saline Repeat labs.  LOS: 4 days    Kathryne Eriksson. Dahlia Bailiff, MD, FACS [phone] [facility] 04/16/2016

## 2016-04-16 NOTE — Plan of Care (Signed)
Problem: Education: Goal: Knowledge of [facility] Education information/materials will improve Outcome: Progressing POC reviewed with pt.; updated pt. on tx of increase HR and Dr. aware.

## 2016-04-16 NOTE — Progress Notes (Signed)
Response per Dr Jeannine Boga, no intervention warranted at this time. Patient HR remains elevated, 130s, and patient remains w/o related c/o. Patient denies pain/discomfort. Foley catheter patent draining clear yellow urine. Gerrit Friends, Agricultural consultant, informed.

## 2016-04-16 NOTE — Progress Notes (Signed)
Pt. sleeping- HR 119.

## 2016-04-16 NOTE — Progress Notes (Signed)
NS bolus (1L) given over 1hr and Amion message to cardiology on call for f/u  per Dr. Ninfa Linden. Cardiology response pending. Gerrit Friends, Agricultural consultant informed.

## 2016-04-16 NOTE — Progress Notes (Signed)
Amion message (2nd) to on call Cardiology MD. Patient HR remains elevated, 130s. MD response pending.

## 2016-04-16 NOTE — Consult Note (Signed)
Requesting: Dr Hulen Skains  Primary cardiologist: Dr Rayann Heman  HPI: 66 year old male with past medical history of coronary artery disease, ischemic cardiomyopathy, atrial tachycardia and now status post abdominal surgery for evaluation of tachycardia. Patient has had prior PCI of his right coronary artery in 2010 and LAD in 2016. Last cardiac catheterization November 2017 showed 75% mid LAD and a 90% apical LAD. There is a 50-60% origin of the PDA. Last echocardiogram January 2015 showed ejection fraction 40-45%, mild left ventricular hypertrophy, grade 1 diastolic dysfunction and biatrial enlargement. Nuclear study December 2017 showed ejection fraction 58%. There was diaphragmatic attenuation versus scar but no ischemia. Patient's history also significant for prior partial colectomy for adenocarcinoma. He developed atrial tachycardia during that admission and also suffered a cardiac arrest requiring defibrillation. His QT was prolonged felt secondary to hypokalemia and hypomagnesemia. Patient admitted on February 20 and underwent reversal of colostomy. Course complicated by postoperative ileus. Also noted to be tachycardic and cardiology asked to evaluate. At present patient denies increased dyspnea, chest pain, palpitations or syncope. He does have significant abdominal pain. He is not having bowel movements or flatus.  Medications Prior to Admission  Medication Sig Dispense Refill  . aspirin EC 81 MG tablet Take 1 tablet (81 mg total) by mouth daily. 30 tablet 0  . atorvastatin (LIPITOR) 40 MG tablet TAKE 1 TABLET BY MOUTH DAILY 90 tablet 3  . clopidogrel (PLAVIX) 75 MG tablet TAKE 1 TABLET (75 MG TOTAL) BY MOUTH DAILY WITH BREAKFAST. 90 tablet 3  . ferrous sulfate 325 (65 FE) MG tablet Take 1 tablet (325 mg total) by mouth daily with breakfast. 30 tablet 1  . furosemide (LASIX) 40 MG tablet Take 1 tablet (40 mg total) by mouth daily. 90 tablet 3  . Magnesium 500 MG CAPS Take 500 mg by mouth daily.    .  metoprolol tartrate (LOPRESSOR) 25 MG tablet Take 1.5 tablets (37.5 mg total) by mouth 2 (two) times daily. 90 tablet 6  . Multiple Vitamins-Minerals (MULTIVITAMIN WITH MINERALS) tablet Take 1 tablet by mouth daily.    . nitroGLYCERIN (NITROSTAT) 0.4 MG SL tablet Place 0.4 mg under the tongue every 5 (five) minutes as needed for chest pain.    . potassium chloride SA (KLOR-CON M20) 20 MEQ tablet Take 1 tablet (20 mEq total) by mouth 2 (two) times daily. 180 tablet 3  . Simethicone 125 MG TABS Take 125 mg by mouth 4 (four) times daily.    [name] Kitchen spironolactone (ALDACTONE) 25 MG tablet TAKE 1 TABLET (25 MG TOTAL) BY MOUTH DAILY. 90 tablet 2  . verapamil (CALAN-SR) 240 MG CR tablet Take 1 tablet (240 mg total) by mouth daily. 30 tablet 6  . levalbuterol (XOPENEX HFA) 45 MCG/ACT inhaler INHALE 2 PUFFS INTO THE LUNGS EVERY 4 (FOUR) HOURS AS NEEDED FOR WHEEZING. 15 Inhaler 3  . sildenafil (VIAGRA) 100 MG tablet Take 0.5 tablets (50 mg total) by mouth daily as needed for erectile dysfunction. 10 tablet 3    Allergies  Allergen Reactions  . No Known Allergies      Past Medical History:  Diagnosis Date  . Arthritis   . Atrial tachycardia ([place])    managed on beta blocker therapy  . Childhood asthma    "went away after I was 16"  . Chronic combined systolic and diastolic CHF, NYHA class 3 (HCC)    has diastolic heart failure grade 1; EF is 45 to 50% per echo 05/2011; EF 41% by Myoview 2016  . CKD (  chronic kidney disease) stage 3, GFR 30-59 ml/min   . Colon cancer ([place]) 2015   MSI high; IHC loss of MLH1 and PMS2; BRAF negative; Negative methylation  . COPD (chronic obstructive pulmonary disease) ([place])   . Coronary artery disease   . Family history of ovarian cancer   . Hypercholesteremia   . Hypertension   . Noncompliance   . NSVT (nonsustained ventricular tachycardia) (HCC)    beta blocker restarted  . Obesity   . OSA on CPAP    used nightly, pt does not know settings  . Peripheral arterial  disease (HCC)    4.2 cm thoracic aortic aneurysm per chest ct 11-14-15 epic  . S/P colostomy ([place])    2014  . Thrombocytopenia ([place])   . Torsades de pointes ([place])    X 2 episodes during hospital visit 12'14"electrolyte imbalance"- "Shocked"    Past Surgical History:  Procedure Laterality Date  . COLON SURGERY    . COLONOSCOPY N/A 02/08/2013   Procedure: COLONOSCOPY;  Surgeon: [name], MD;  Location: [facility];  Service: Endoscopy;  Laterality: N/A;  . COLONOSCOPY WITH PROPOFOL N/A 05/09/2014   Procedure: COLONOSCOPY WITH PROPOFOL;  Surgeon: [name], MD;  Location: [facility];  Service: Endoscopy;  Laterality: N/A;  . COLONOSCOPY WITH PROPOFOL N/A 01/08/2016   Procedure: COLONOSCOPY WITH PROPOFOL;  Surgeon: [name], MD;  Location: [facility];  Service: Endoscopy;  Laterality: N/A;  . COLOSTOMY REVERSAL  04/12/2016  . COLOSTOMY REVERSAL N/A 04/12/2016   Procedure: COLOSTOMY REVERSAL;  Surgeon: Judeth Horn, MD;  Location: [facility];  Service: General;  Laterality: N/A;  . CORONARY ANGIOPLASTY WITH STENT PLACEMENT  11/12/2008; 06/11/2014   stent x 2 to RCA; stent x 2 to LAD  . CORONARY ANGIOPLASTY WITH STENT PLACEMENT  06/11/2014   m-LAD 3.5 x 16 mm Synergy DES, d-LAD  2.25 x 16 mm Synergy DES  . ESOPHAGOGASTRODUODENOSCOPY N/A 02/08/2013   Procedure: ESOPHAGOGASTRODUODENOSCOPY (EGD);  Surgeon: [name], MD;  Location: [facility];  Service: Endoscopy;  Laterality: N/A;  . FLEXIBLE SIGMOIDOSCOPY N/A 02/19/2016   Procedure: FLEXIBLE SIGMOIDOSCOPY;  Surgeon: [name], MD;  Location: [facility];  Service: Endoscopy;  Laterality: N/A;  . LAPAROTOMY N/A 02/12/2013   Procedure: EXPLORATORY LAPAROTOMY PARTIAL COLECTOMY WITH COLOSTOMY;  Surgeon: [name], MD;  Location: [facility];  Service: General;  Laterality: N/A;  . LAPAROTOMY N/A 02/18/2013   Procedure: EXPLORATORY LAPAROTOMY/Closure of Wound;  Surgeon: [name], MD;  Location: [facility];  Service: General;   Laterality: N/A;  . LEFT HEART CATHETERIZATION WITH CORONARY ANGIOGRAM N/A 06/11/2014   Procedure: LEFT HEART CATHETERIZATION WITH CORONARY ANGIOGRAM;  Surgeon: [name], MD; CFX calcified, 30-40 percent, RCA calcified, 40/50/40%, PDA diffuse disease, LAD 40/75/90% s/p DES 2     Social History   Social History  . Marital status: Married    Spouse name: [name]  . Number of children: 1  . Years of education: N/A   Occupational History  . Not on file.   Social History Main Topics  . Smoking status: Former Smoker    Packs/day: 1.00    Years: 38.00    Types: Cigarettes    Quit date: 07/15/2008  . Smokeless tobacco: Never Used  . Alcohol use 0.0 oz/week     Comment: none snice colostomy placed  . Drug use: No  . Sexual activity: Not Currently   Other Topics Concern  . Not on file   Social History Narrative  . No narrative  on file    Family History  Problem Relation Age of Onset  . Hypertension Father   . Heart disease Father     before age 57  . Diabetes Father   . Hypertension Mother   . Dementia Mother   . Parkinsonism Mother   . Liver cancer Maternal Grandmother     dx in her 67s  . Prostate cancer Maternal Uncle   . Leukemia Maternal Uncle     ROS:  Pt complains of abdominal pain but no fevers or chills, productive cough, hemoptysis, dysphasia, odynophagia, dysuria, hematuria, rash, seizure activity, orthopnea, PND, pedal edema, claudication. Remaining systems are negative.  Physical Exam:   Blood pressure (!) 142/93, pulse (!) 141, temperature 98.7 F (37.1 C), temperature source Oral, resp. rate 14, height 5' 10.5" (1.791 m), weight (!) 371 lb 7.6 oz (168.5 kg), SpO2 95 %.  General:  Well developed/obese in NAD Skin warm/dry Patient not depressed No peripheral clubbing HEENT-normal/normal eyelids; NG tube in place Neck supple/normal carotid upstroke bilaterally; no bruits; no JVD; no thyromegaly chest - Diminished BS bases CV - regular and  tachycardic/normal S1 and S2; no murmurs, rubs or gallops;  PMI nondisplaced Abdomen -NT; distended; s/p abdominal surgery; no BS 2+ femoral pulses, no bruits Ext-trace edema, no chords, 1+ DP Neuro-grossly nonfocal  ECG - sinus tach versus atrial tachycardia, prior inferior infarct.  Results for orders placed or performed during the hospital encounter of 04/12/16 (from the past 48 hour(s))  Basic metabolic panel     Status: Abnormal   Collection Time: 04/15/16  4:59 AM  Result Value Ref Range   Sodium 138 135 - 145 mmol/L   Potassium 4.4 3.5 - 5.1 mmol/L   Chloride 103 101 - 111 mmol/L   CO2 23 22 - 32 mmol/L   Glucose, Bld 143 (H) 65 - 99 mg/dL   BUN 28 (H) 6 - 20 mg/dL   Creatinine, Ser 2.26 (H) 0.61 - 1.24 mg/dL   Calcium 9.8 8.9 - 10.3 mg/dL   GFR calc non Af Amer 29 (L) >60 mL/min   GFR calc Af Amer 34 (L) >60 mL/min    Comment: (NOTE) The eGFR has been calculated using the CKD EPI equation. This calculation has not been validated in all clinical situations. eGFR's persistently <60 mL/min signify possible Chronic Kidney Disease.    Anion gap 12 5 - 15  CBC with Differential/Platelet     Status: Abnormal   Collection Time: 04/16/16 11:25 AM  Result Value Ref Range   WBC 9.4 4.0 - 10.5 K/uL   RBC 4.04 (L) 4.22 - 5.81 MIL/uL   Hemoglobin 11.2 (L) 13.0 - 17.0 g/dL   HCT 36.5 (L) 39.0 - 52.0 %   MCV 90.3 78.0 - 100.0 fL   MCH 27.7 26.0 - 34.0 pg   MCHC 30.7 30.0 - 36.0 g/dL   RDW 15.2 11.5 - 15.5 %   Platelets 156 150 - 400 K/uL   Neutrophils Relative % 77 %   Neutro Abs 7.3 1.7 - 7.7 K/uL   Lymphocytes Relative 8 %   Lymphs Abs 0.8 0.7 - 4.0 K/uL   Monocytes Relative 14 %   Monocytes Absolute 1.3 (H) 0.1 - 1.0 K/uL   Eosinophils Relative 1 %   Eosinophils Absolute 0.1 0.0 - 0.7 K/uL   Basophils Relative 0 %   Basophils Absolute 0.0 0.0 - 0.1 K/uL  Basic metabolic panel     Status: Abnormal   Collection Time: 04/16/16 11:25  AM  Result Value Ref Range   Sodium  149 (H) 135 - 145 mmol/L    Comment: DELTA CHECK NOTED   Potassium 3.9 3.5 - 5.1 mmol/L   Chloride 105 101 - 111 mmol/L   CO2 35 (H) 22 - 32 mmol/L   Glucose, Bld 109 (H) 65 - 99 mg/dL   BUN 33 (H) 6 - 20 mg/dL   Creatinine, Ser 2.08 (H) 0.61 - 1.24 mg/dL   Calcium 9.5 8.9 - 10.3 mg/dL   GFR calc non Af Amer 32 (L) >60 mL/min   GFR calc Af Amer 37 (L) >60 mL/min    Comment: (NOTE) The eGFR has been calculated using the CKD EPI equation. This calculation has not been validated in all clinical situations. eGFR's persistently <60 mL/min signify possible Chronic Kidney Disease.    Anion gap 9 5 - 15    Dg Abd Portable 1v  Result Date: 04/15/2016 CLINICAL DATA:  NG tube placement. EXAM: PORTABLE ABDOMEN - 1 VIEW COMPARISON:  02/23/2013. FINDINGS: NG tube noted with tip in the upper portion stomach. Advancement of approximately 8 cm should be considered. Surgical staples are noted over the abdomen. Distended loops of bowel including small bowel are noted. Follow-up exams suggested to demonstrate resolution. IMPRESSION: 1. NG tube tip noted in the upper portion of the stomach. Advancement of approximately 8 cm should be considered. 2. Persistent distended loops of bowel including small bowel. Follow-up exam suggested to demonstrate resolution. Electronically Signed   By: Marcello Moores  Register   On: 04/15/2016 09:18    Assessment/Plan 1 tachycardia-this appears to be sinus tachycardia but patient also with history of atrial tachycardia. This is likely being driven from postoperative hyperadrenergic state including abdominal pain. There may also be a contribution from dehydration as he is nothing by mouth for his ileus. He is likely not absorbing his by mouth medications. I will hold diuretics, verapamil and oral metoprolol. We will treat with metoprolol 5 mg IV every 6 hours and increase if needed. We will resume oral regimen once he is tolerating. I will plan to repeat his echocardiogram.  2 coronary  artery disease-patient has history of PCI with DES. Would like to continue aspirin if possible. Plavix on hold. We'll resume statin at discharge.  3 acute on chronic stage III kidney disease-there may be a component of dehydration. Hold diuretics for now and follow.  4 status post abdominal surgery/ileus-per surgery.  5 hypertension-blood pressure elevated. Will hold oral medications given ileus. Add metoprolol 5 mg IV every 6 hours.   6 history of torsades-this occurred postoperatively at time of colon cancer resection and was felt secondary to electrolyte abnormalities. Follow potassium and magnesium.  Kirk Ruths MD 04/16/2016, 1:42 PM

## 2016-04-16 NOTE — Progress Notes (Signed)
Pt. Still tachycardic in 130's. Dr. Hulen Skains notified. Per Dr. Hulen Skains, he has already contacted Cardiology and if they have not come to see pt. Within next hour then page Cardiology again. Pt. Not complaining of chest pain. Pt. States he's okay for right now. Will continue to monitor.

## 2016-04-16 NOTE — Progress Notes (Signed)
Pt. Became tachycardic sustaining heart rate in the 140's. MD (Dr. Hulen Skains) was notified and new orders put in.

## 2016-04-16 NOTE — Progress Notes (Signed)
EKG done and results called to Dr. Kieth Brightly, and orders given. Pt. awake and talking; no c/o's.

## 2016-04-16 NOTE — Progress Notes (Signed)
Pt accidentally pulled out NG tube, called and notified Dr. Rush Farmer, will replace.

## 2016-04-16 NOTE — Progress Notes (Signed)
Pt. Had a 3 beat run of Vtach. Cardiology MD in room with pt. And I made him aware.

## 2016-04-17 LAB — BASIC METABOLIC PANEL
Anion gap: 12 (ref 5–15)
BUN: 26 mg/dL — AB (ref 6–20)
CHLORIDE: 106 mmol/L (ref 101–111)
CO2: 34 mmol/L — AB (ref 22–32)
CREATININE: 1.8 mg/dL — AB (ref 0.61–1.24)
Calcium: 9.3 mg/dL (ref 8.9–10.3)
GFR calc Af Amer: 44 mL/min — ABNORMAL LOW (ref >60)
GFR calc non Af Amer: 38 mL/min — ABNORMAL LOW (ref >60)
Glucose, Bld: 92 mg/dL (ref 65–99)
Potassium: 4 mmol/L (ref 3.5–5.1)
Sodium: 152 mmol/L — ABNORMAL HIGH (ref 135–145)

## 2016-04-17 LAB — CBC WITH DIFFERENTIAL/PLATELET
Basophils Absolute: 0 10*3/uL (ref 0.0–0.1)
Basophils Relative: 0 %
Eosinophils Absolute: 0.1 10*3/uL (ref 0.0–0.7)
Eosinophils Relative: 1 %
HEMATOCRIT: 35.6 % — AB (ref 39.0–52.0)
HEMOGLOBIN: 10.7 g/dL — AB (ref 13.0–17.0)
LYMPHS ABS: 1 10*3/uL (ref 0.7–4.0)
LYMPHS PCT: 12 %
MCH: 27.4 pg (ref 26.0–34.0)
MCHC: 30.1 g/dL (ref 30.0–36.0)
MCV: 91 fL (ref 78.0–100.0)
MONOS PCT: 15 %
Monocytes Absolute: 1.3 10*3/uL — ABNORMAL HIGH (ref 0.1–1.0)
NEUTROS ABS: 6.1 10*3/uL (ref 1.7–7.7)
NEUTROS PCT: 72 %
Platelets: 192 10*3/uL (ref 150–400)
RBC: 3.91 MIL/uL — ABNORMAL LOW (ref 4.22–5.81)
RDW: 15.5 % (ref 11.5–15.5)
WBC: 8.5 10*3/uL (ref 4.0–10.5)

## 2016-04-17 LAB — ECHOCARDIOGRAM COMPLETE
Height: 70.5 in
WEIGHTICAEL: 5943.6 [oz_av]

## 2016-04-17 MED ORDER — METOPROLOL TARTRATE 5 MG/5ML IV SOLN
10.0000 mg | Freq: Four times a day (QID) | INTRAVENOUS | Status: DC
  Administered 2016-04-17 – 2016-04-24 (×25): 10 mg via INTRAVENOUS
  Filled 2016-04-17 (×25): qty 10

## 2016-04-17 MED ORDER — POTASSIUM CHLORIDE IN NACL 20-0.45 MEQ/L-% IV SOLN
INTRAVENOUS | Status: DC
  Administered 2016-04-17 – 2016-04-19 (×4): via INTRAVENOUS
  Filled 2016-04-17 (×4): qty 1000

## 2016-04-17 NOTE — Progress Notes (Signed)
HR 140s, PRN Lopressor given. NA 152 this am. Texted [facility] floor call #.

## 2016-04-17 NOTE — Progress Notes (Signed)
Called and s/w central tele about HR parameters, set at 135 for now since [facility] and Card are aware of HR issues.  Will continue to closely monitor and give PRN lopressor.

## 2016-04-17 NOTE — Progress Notes (Signed)
Progress Note  Patient Name: Larry [name] Date of Encounter: 04/17/2016  Primary Cardiologist: Dr Rayann Heman  Subjective   C/O mild dyspnea; no chest pain; no palpitations; abd pain improving  Inpatient Medications    Scheduled Meds: . alvimopan  12 mg Oral BID  . aspirin  300 mg Rectal Daily  . atorvastatin  40 mg Oral q1800  . bethanechol  10 mg Oral TID  . enoxaparin (LOVENOX) injection  40 mg Subcutaneous Q24H  . ferrous sulfate  325 mg Oral Q breakfast  . HYDROmorphone   Intravenous Q4H  . metoprolol  5 mg Intravenous Q6H  . multivitamin with minerals  1 tablet Oral Daily  . simethicone  80 mg Oral QID  . sodium chloride  1,000 mL Intravenous Once  . tamsulosin  0.4 mg Oral QPC supper   Continuous Infusions: . 0.45 % NaCl with KCl 20 mEq / L 75 mL/hr at 04/17/16 0516   PRN Meds: acetaminophen, diphenhydrAMINE **OR** diphenhydrAMINE, levalbuterol, metoprolol, naloxone **AND** sodium chloride flush, nitroGLYCERIN, ondansetron (ZOFRAN) IV   Vital Signs    Vitals:   04/17/16 0437 04/17/16 0511 04/17/16 0727 04/17/16 1025  BP:  (!) 169/80  (!) 157/91  Pulse:  (!) 103  (!) 104  Resp: 17 17 14 18   Temp:  98.5 F (36.9 C)  98.5 F (36.9 C)  TempSrc:  Oral  Axillary  SpO2: 94% 92% 92% 94%  Weight:      Height:        Intake/Output Summary (Last 24 hours) at 04/17/16 1124 Last data filed at 04/17/16 1024  Gross per 24 hour  Intake                0 ml  Output             5600 ml  Net            -5600 ml   Filed Weights   04/12/16 0556 04/15/16 0917  Weight: (!) 377 lb (171 kg) (!) 371 lb 7.6 oz (168.5 kg)    Telemetry    Sinus tach with intermittent atrial tach - Personally Reviewed  Physical Exam   GEN: No acute distress. Obese Neck: supple Cardiac: regular and tachycardic Respiratory: Mildly diminished BS bases GI: Distended; s/p abd surgery MS: trace edema; No deformity. Neuro:  Nonfocal  Psych: Normal affect   Labs    Chemistry Recent  Labs Lab 04/15/16 0459 04/16/16 1125 04/17/16 0530  NA 138 149* 152*  K 4.4 3.9 4.0  CL 103 105 106  CO2 23 35* 34*  GLUCOSE 143* 109* 92  BUN 28* 33* 26*  CREATININE 2.26* 2.08* 1.80*  CALCIUM 9.8 9.5 9.3  GFRNONAA 29* 32* 38*  GFRAA 34* 37* 44*  ANIONGAP 12 9 12      Hematology Recent Labs Lab 04/14/16 0452 04/16/16 1125 04/17/16 0530  WBC 11.4* 9.4 8.5  RBC 4.37 4.04* 3.91*  HGB 12.0* 11.2* 10.7*  HCT 38.8* 36.5* 35.6*  MCV 88.8 90.3 91.0  MCH 27.5 27.7 27.4  MCHC 30.9 30.7 30.1  RDW 15.1 15.2 15.5  PLT 144* 156 192    Radiology    Dg Abd Portable 1v  Result Date: 04/16/2016 CLINICAL DATA:  NG tube placement EXAM: PORTABLE ABDOMEN - 1 VIEW COMPARISON:  04/15/2016 FINDINGS: Only the left hemiabdomen is imaged. Midline cutaneous staples. Esophageal tube tip overlies the proximal stomach. Dilated loops of small bowel, measuring up to 5.2 cm in the left lower quadrant.  IMPRESSION: Esophageal tube tip overlies the proximal stomach. Dilated loops of small bowel in the left lower quadrant. Electronically Signed   By: Donavan Foil M.D.   On: 04/16/2016 19:52    Patient Profile     66 year old male with past medical history of coronary artery disease, ischemic cardiomyopathy, atrial tachycardia and now status post abdominal surgery we were consulted for tachycardia. Patient has had prior PCI of his right coronary artery in 2010 and LAD in 2016. Last cardiac catheterization November 2017 showed 75% mid LAD and a 90% apical LAD. There is a 50-60% origin of the PDA. Last echocardiogram January 2015 showed ejection fraction 40-45%, mild left ventricular hypertrophy, grade 1 diastolic dysfunction and biatrial enlargement. Nuclear study December 2017 showed ejection fraction 58%. There was diaphragmatic attenuation versus scar but no ischemia. Patient's history also significant for prior partial colectomy for adenocarcinoma in 2014. He developed atrial tachycardia during that  admission and also suffered a cardiac arrest requiring defibrillation. His QT was prolonged felt secondary to hypokalemia and hypomagnesemia. Patient admitted on February 20 and underwent reversal of colostomy. Course complicated by postoperative ileus. Also noted to be tachycardic and cardiology asked to evaluate.   Assessment & Plan    1 tachycardia-sinus tach with intermittent atrial tachycardia. This is likely being driven by postoperative hyperadrenergic state including abdominal pain. There may also be a contribution from dehydration as he is npo for his ileus. He is likely not absorbing his by mouth medications. Diuretics, verapamil and oral metoprolol on hold. Increase metoprolol to 10 mg IV every 6 hours. We will resume oral regimen once he is tolerating. Echo pending  2 coronary artery disease-patient has history of PCI with DES. Continue ASA supp and change to po when able. Plavix on hold; resume at DC. We'll resume statin at discharge.  3 acute on chronic stage III kidney disease-renal function near baseline; he is npo and being hydrated; will continue to hold lasix for now; follow exam closely and diurese/decrease IVFs as needed. I/O reportedly -12500 since admission.  4 status post abdominal surgery/ileus-per surgery.  5 hypertension-blood pressure elevated. Will hold oral medications given ileus. Increase metoprolol to 10 mg IV every 6 hours.   6 history of torsades-this occurred postoperatively at time of colon cancer resection and was felt secondary to electrolyte abnormalities. Follow potassium and magnesium.  Signed, Kirk Ruths, MD  04/17/2016, 11:24 AM

## 2016-04-17 NOTE — Progress Notes (Signed)
[facility] Progress Note  5 Days Post-Op  Subjective: Pt complaining of suprapubic pain. States it is unchanged. He states his pain is moderately controlled with the PCA. No flatus.   Objective: Vital signs in last 24 hours: Temp:  [98.4 F (36.9 C)-100 F (37.8 C)] 98.5 F (36.9 C) (02/25 0511) Pulse Rate:  [96-141] 103 (02/25 0511) Resp:  [14-17] 14 (02/25 0727) BP: (134-169)/(70-93) 169/80 (02/25 0511) SpO2:  [92 %-97 %] 92 % (02/25 0727)    Intake/Output from previous day: 02/24 0701 - 02/25 0700 In: 0  Out: 5050 [Urine:2700; Emesis/NG output:2350] Intake/Output this shift: No intake/output data recorded.  PE: Gen:  Alert, NAD, pleasant, cooperative, lying in bed, well appearing Card:  RRR, no M/G/R heard Pulm:  CTA, no W/R/R, effort normal Abd: Soft, obese, mildly distended, few BS heard, staples intact no surrounding erythema, edema, scant sanguinous drainage, drain with no output noted Skin: no rashes noted, warm and dry, not diaphoretic  Lab Results:   Recent Labs  04/16/16 1125 04/17/16 0530  WBC 9.4 8.5  HGB 11.2* 10.7*  HCT 36.5* 35.6*  PLT 156 192   BMET  Recent Labs  04/16/16 1125 04/17/16 0530  NA 149* 152*  K 3.9 4.0  CL 105 106  CO2 35* 34*  GLUCOSE 109* 92  BUN 33* 26*  CREATININE 2.08* 1.80*  CALCIUM 9.5 9.3   PT/INR No results for input(s): LABPROT, INR in the last 72 hours. CMP     Component Value Date/Time   NA 152 (H) 04/17/2016 0530   NA 141 03/31/2014 1135   K 4.0 04/17/2016 0530   K 4.3 03/31/2014 1135   CL 106 04/17/2016 0530   CO2 34 (H) 04/17/2016 0530   CO2 23 03/31/2014 1135   GLUCOSE 92 04/17/2016 0530   GLUCOSE 96 03/31/2014 1135   BUN 26 (H) 04/17/2016 0530   BUN 25.5 03/31/2014 1135   CREATININE 1.80 (H) 04/17/2016 0530   CREATININE 1.72 (H) 06/29/2015 1416   CREATININE 1.9 (H) 03/31/2014 1135   CALCIUM 9.3 04/17/2016 0530   CALCIUM 9.8 03/31/2014 1135   PROT 7.6 04/07/2016 1101   PROT 7.4  03/31/2014 1135   ALBUMIN 3.9 04/07/2016 1101   ALBUMIN 3.9 03/31/2014 1135   AST 27 04/07/2016 1101   AST 17 03/31/2014 1135   ALT 32 04/07/2016 1101   ALT 18 03/31/2014 1135   ALKPHOS 109 04/07/2016 1101   ALKPHOS 100 03/31/2014 1135   BILITOT 1.2 04/07/2016 1101   BILITOT 1.27 (H) 03/31/2014 1135   GFRNONAA 38 (L) 04/17/2016 0530   GFRAA 44 (L) 04/17/2016 0530   Lipase     Component Value Date/Time   LIPASE 15 11/05/2008 1215       Studies/Results: Dg Abd Portable 1v  Result Date: 04/16/2016 CLINICAL DATA:  NG tube placement EXAM: PORTABLE ABDOMEN - 1 VIEW COMPARISON:  04/15/2016 FINDINGS: Only the left hemiabdomen is imaged. Midline cutaneous staples. Esophageal tube tip overlies the proximal stomach. Dilated loops of small bowel, measuring up to 5.2 cm in the left lower quadrant. IMPRESSION: Esophageal tube tip overlies the proximal stomach. Dilated loops of small bowel in the left lower quadrant. Electronically Signed   By: Donavan Foil M.D.   On: 04/16/2016 19:52    Anti-infectives: Anti-infectives    Start     Dose/Rate Route Frequency Ordered Stop   04/12/16 2000  cefoTEtan (CEFOTAN) 2 g in dextrose 5 % 50 mL IVPB     2 g  100 mL/hr over 30 Minutes Intravenous Every 12 hours 04/12/16 1120 04/12/16 2125   04/12/16 0700  cefoTEtan (CEFOTAN) 2 g in dextrose 5 % 50 mL IVPB     2 g 100 mL/hr over 30 Minutes Intravenous To ShortStay Surgical 04/11/16 1404 04/12/16 0753   04/12/16 0548  cefoTEtan in Dextrose 5% (CEFOTAN) 2-2.08 GM-% IVPB    Comments:  Precious Haws   : cabinet override      04/12/16 0548 04/12/16 1759   04/12/16 0544  neomycin (MYCIFRADIN) tablet 1,000 mg  Status:  Discontinued     1,000 mg Oral 3 times per day 04/12/16 0544 04/12/16 0547   04/12/16 0544  metroNIDAZOLE (FLAGYL) tablet 1,000 mg  Status:  Discontinued     1,000 mg Oral 3 times per day 04/12/16 0544 04/12/16 0547       Assessment/Plan  s/p Procedure(s): [facility]  consultation for tachycardia - metoprolol    Hypernatremia - AM labs, will monitor    LOS: 5 days    Kalman Drape , [facility] 04/17/2016, 9:20 AM Pager: [phone] Consults: [phone] Mon-Fri 7:00 am-4:30 pm Sat-Sun 7:00 am-11:30 am

## 2016-04-18 ENCOUNTER — Encounter (HOSPITAL_COMMUNITY): Payer: Medicare Other

## 2016-04-18 DIAGNOSIS — I479 Paroxysmal tachycardia, unspecified: Secondary | ICD-10-CM

## 2016-04-18 DIAGNOSIS — Z933 Colostomy status: Secondary | ICD-10-CM

## 2016-04-18 DIAGNOSIS — N183 Chronic kidney disease, stage 3 (moderate): Secondary | ICD-10-CM

## 2016-04-18 LAB — CBC WITH DIFFERENTIAL/PLATELET
BASOS ABS: 0 10*3/uL (ref 0.0–0.1)
Basophils Relative: 0 %
EOS ABS: 0 10*3/uL (ref 0.0–0.7)
EOS PCT: 0 %
HCT: 40.2 % (ref 39.0–52.0)
HEMOGLOBIN: 12.2 g/dL — AB (ref 13.0–17.0)
LYMPHS PCT: 9 %
Lymphs Abs: 1 10*3/uL (ref 0.7–4.0)
MCH: 27.9 pg (ref 26.0–34.0)
MCHC: 30.3 g/dL (ref 30.0–36.0)
MCV: 91.8 fL (ref 78.0–100.0)
Monocytes Absolute: 1.3 10*3/uL — ABNORMAL HIGH (ref 0.1–1.0)
Monocytes Relative: 11 %
NEUTROS PCT: 80 %
Neutro Abs: 8.8 10*3/uL — ABNORMAL HIGH (ref 1.7–7.7)
PLATELETS: 230 10*3/uL (ref 150–400)
RBC: 4.38 MIL/uL (ref 4.22–5.81)
RDW: 15.3 % (ref 11.5–15.5)
WBC: 11.1 10*3/uL — AB (ref 4.0–10.5)

## 2016-04-18 LAB — BASIC METABOLIC PANEL
Anion gap: 9 (ref 5–15)
BUN: 25 mg/dL — AB (ref 6–20)
CO2: 37 mmol/L — ABNORMAL HIGH (ref 22–32)
Calcium: 9.6 mg/dL (ref 8.9–10.3)
Chloride: 105 mmol/L (ref 101–111)
Creatinine, Ser: 1.92 mg/dL — ABNORMAL HIGH (ref 0.61–1.24)
GFR, EST AFRICAN AMERICAN: 41 mL/min — AB (ref >60)
GFR, EST NON AFRICAN AMERICAN: 35 mL/min — AB (ref >60)
Glucose, Bld: 101 mg/dL — ABNORMAL HIGH (ref 65–99)
POTASSIUM: 4.1 mmol/L (ref 3.5–5.1)
SODIUM: 151 mmol/L — AB (ref 135–145)

## 2016-04-18 LAB — PHOSPHORUS: Phosphorus: 4 mg/dL (ref 2.5–4.6)

## 2016-04-18 LAB — MAGNESIUM: MAGNESIUM: 2.4 mg/dL (ref 1.7–2.4)

## 2016-04-18 LAB — GLUCOSE, CAPILLARY
GLUCOSE-CAPILLARY: 118 mg/dL — AB (ref 65–99)
GLUCOSE-CAPILLARY: 118 mg/dL — AB (ref 65–99)

## 2016-04-18 MED ORDER — TRACE MINERALS CR-CU-MN-SE-ZN 10-1000-500-60 MCG/ML IV SOLN
INTRAVENOUS | Status: AC
  Administered 2016-04-18: 17:00:00 via INTRAVENOUS
  Filled 2016-04-18: qty 960

## 2016-04-18 MED ORDER — INSULIN ASPART 100 UNIT/ML ~~LOC~~ SOLN
0.0000 [IU] | Freq: Four times a day (QID) | SUBCUTANEOUS | Status: DC
  Administered 2016-04-19 – 2016-04-20 (×2): 1 [IU] via SUBCUTANEOUS
  Administered 2016-04-20: 2 [IU] via SUBCUTANEOUS
  Administered 2016-04-20 – 2016-04-21 (×5): 1 [IU] via SUBCUTANEOUS
  Administered 2016-04-21: 3 [IU] via SUBCUTANEOUS
  Administered 2016-04-22 (×3): 1 [IU] via SUBCUTANEOUS
  Administered 2016-04-23 (×3): 2 [IU] via SUBCUTANEOUS
  Administered 2016-04-23 – 2016-04-24 (×4): 1 [IU] via SUBCUTANEOUS
  Administered 2016-04-24: 2 [IU] via SUBCUTANEOUS
  Administered 2016-04-25 – 2016-04-28 (×16): 1 [IU] via SUBCUTANEOUS
  Administered 2016-04-29: 2 [IU] via SUBCUTANEOUS
  Administered 2016-04-29 – 2016-05-01 (×7): 1 [IU] via SUBCUTANEOUS
  Administered 2016-05-01: 2 [IU] via SUBCUTANEOUS
  Administered 2016-05-01 – 2016-05-02 (×4): 1 [IU] via SUBCUTANEOUS
  Administered 2016-05-02: 2 [IU] via SUBCUTANEOUS
  Administered 2016-05-02: 1 [IU] via SUBCUTANEOUS
  Administered 2016-05-03: 2 [IU] via SUBCUTANEOUS
  Administered 2016-05-03 – 2016-05-06 (×11): 1 [IU] via SUBCUTANEOUS

## 2016-04-18 MED ORDER — FAT EMULSION 20 % IV EMUL
240.0000 mL | INTRAVENOUS | Status: AC
  Administered 2016-04-18: 240 mL via INTRAVENOUS
  Filled 2016-04-18 (×2): qty 250

## 2016-04-18 MED ORDER — PANTOPRAZOLE SODIUM 40 MG IV SOLR
40.0000 mg | INTRAVENOUS | Status: DC
  Administered 2016-04-18 – 2016-04-19 (×2): 40 mg via INTRAVENOUS
  Filled 2016-04-18 (×2): qty 40

## 2016-04-18 MED ORDER — SODIUM CHLORIDE 0.9% FLUSH: 10.0000 mL | INTRAVENOUS | Status: DC | PRN

## 2016-04-18 NOTE — Progress Notes (Signed)
PT Cancellation Note  Patient Details Name: Larry [name] MRN: [id number] DOB: 03-Mar-1950   Cancelled Treatment:    Reason Eval/Treat Not Completed: Medical issues which prohibited therapy HR up to 150s. RN present and giving lopressors. PT will check on pt later as time allows and pt is medically ready.    Salina April, PTA Pager: [phone]   04/18/2016, 11:52 AM

## 2016-04-18 NOTE — Progress Notes (Addendum)
PHARMACY - ADULT TOTAL PARENTERAL NUTRITION CONSULT NOTE   Pharmacy Consult:  TPN Indication:  Prolonged ileus  Patient Measurements: Height: 5' 10.5" (179.1 cm) Weight: (!) 371 lb 7.6 oz (168.5 kg) IBW/kg (Calculated) : 74.15 TPN AdjBW (KG): 97.7 Body mass index is 52.55 kg/m.   Assessment:  46 YOM with history of an obstructing tumor s/p emergent colectomy with colostomy of right transverse colon in 2014.  Patient presented on 04/12/16 for reversal of colostomy.  Postoperatively, he had emesis on 04/14/16 and a distended abdomen on 04/15/16.  He has consumed </= 10% of his meals for 7 days and Pharmacy consulted to initiate TPN for prolonged ileus.  Patient reports eating regularly and gained 3-4 lbs before admission.  GI: prolonged ileus post colostomy reversal - Entereg, simethicone Endo: no hx DM, A1c 5.5% - AM glucose controlled Insulin requirements in the past 24 hours: none Lytes: Na 151, CO2 37, others WNL - 1/2NS 20K at 75 ml/hr *days without lytes in TPN:  2/26 Renal: CKD3 - SCr up 1.92, CrCL 40 ml/min.  Flomax, bethanechol Pulm: OSA on CPAP QHS - currently on 3L [place] Cards: CM / CHF (EF 40-45%) / CAD post PCI / HLD / HTN / PAD / hx atrial tachycardia and torsades - new tachy started 2/23 likely d/t dehydration and malabsorption of meds.  BP/HR elevated - IV metoprolol, ASA PR Hepatobil: LFTS WNL on 2/15 Neuro: Dilaudid PCA - pain score 3-5 ID: afebrile, WBC up 11.1 - not on abx Heme/Onc: hx colon cancer - mild anemia, plts WNL - PO iron Best Practices: Lovenox TPN Access: PICC placed 04/18/16 TPN start date: 04/18/16  Nutritional Goals: 2450-2600 kCal and 130-145 gm protein per day  Current Nutrition:  NPO   Plan:  - Initiate Clinimix 5/15 at 40 ml/hr (goal rate ~120 ml/hr).  No electrolytes in TPN today to allow Na+ to trend down.  Lipids 20 ml/hr over 12 hours. - Daily multivitamin in TPN (D/C PO multivitamin) - Trace elements every other day d/t shortage (give  today) - Start sensitive SSI Q6H.  D/C if CBGs remain controlled at goal TPN rate. - Watch Na+.  Add back electrolytes if others start to trend down. - 1/2NS 20K at 75 ml/hr per MD - Standard TPN labs and nursing care orders   Maclane Holloran D. Mina Marble, PharmD, BCPS Pager:  [phone] 04/18/2016, 9:26 AM

## 2016-04-18 NOTE — Care Management Important Message (Signed)
Important Message  Patient Details  Name: Larry [name] MRN: [id number] Date of Birth: 1950/09/17   Medicare Important Message Given:       Larry [name] 04/18/2016, 3:51 PM

## 2016-04-18 NOTE — Progress Notes (Signed)
Progress Note  Patient Name: Larry [name] Date of Encounter: 04/18/2016  Primary Cardiologist: Dr Rayann Heman  Subjective   No cardiac complaints. I had to awaken him to exam. Not dyspneic.  Inpatient Medications    Scheduled Meds: . alvimopan  12 mg Oral BID  . aspirin  300 mg Rectal Daily  . atorvastatin  40 mg Oral q1800  . bethanechol  10 mg Oral TID  . enoxaparin (LOVENOX) injection  40 mg Subcutaneous Q24H  . ferrous sulfate  325 mg Oral Q breakfast  . HYDROmorphone   Intravenous Q4H  . insulin aspart  0-9 Units Subcutaneous Q6H  . metoprolol  10 mg Intravenous Q6H  . pantoprazole (PROTONIX) IV  40 mg Intravenous Q24H  . simethicone  80 mg Oral QID  . sodium chloride  1,000 mL Intravenous Once  . tamsulosin  0.4 mg Oral QPC supper   Continuous Infusions: . 0.45 % NaCl with KCl 20 mEq / L 75 mL/hr at 04/18/16 1057  . TPN (CLINIMIX) Adult without lytes     And  . fat emulsion     PRN Meds: acetaminophen, diphenhydrAMINE **OR** diphenhydrAMINE, levalbuterol, metoprolol, naloxone **AND** sodium chloride flush, nitroGLYCERIN, ondansetron (ZOFRAN) IV, sodium chloride flush   Vital Signs    Vitals:   04/18/16 0800 04/18/16 0837 04/18/16 0959 04/18/16 1200  BP:   134/86   Pulse:   (!) 111   Resp:  13 16 13   Temp:   98.7 F (37.1 C)   TempSrc:   Oral   SpO2:  93% 93% 93%  Weight: (!) 371 lb 7.6 oz (168.5 kg)     Height: 5' 10.5" (1.791 m)       Intake/Output Summary (Last 24 hours) at 04/18/16 1255 Last data filed at 04/18/16 1007  Gross per 24 hour  Intake             2410 ml  Output             5150 ml  Net            -2740 ml   Filed Weights   04/12/16 0556 04/15/16 0917 04/18/16 0800  Weight: (!) 377 lb (171 kg) (!) 371 lb 7.6 oz (168.5 kg) (!) 371 lb 7.6 oz (168.5 kg)    Telemetry    Sinus tach at 120 bpm. No ventricular ectopy. - Personally Reviewed  Physical Exam   GEN: No acute distress. Obese Neck: supple Cardiac: regular and  tachycardic Respiratory: Mildly diminished BS bases GI: Distended; s/p abd surgery MS: trace edema; No deformity. Neuro:  Nonfocal  Psych: Normal affect   Labs    Chemistry  Recent Labs Lab 04/16/16 1125 04/17/16 0530 04/18/16 0807  NA 149* 152* 151*  K 3.9 4.0 4.1  CL 105 106 105  CO2 35* 34* 37*  GLUCOSE 109* 92 101*  BUN 33* 26* 25*  CREATININE 2.08* 1.80* 1.92*  CALCIUM 9.5 9.3 9.6  GFRNONAA 32* 38* 35*  GFRAA 37* 44* 41*  ANIONGAP 9 12 9      Hematology  Recent Labs Lab 04/16/16 1125 04/17/16 0530 04/18/16 0807  WBC 9.4 8.5 11.1*  RBC 4.04* 3.91* 4.38  HGB 11.2* 10.7* 12.2*  HCT 36.5* 35.6* 40.2  MCV 90.3 91.0 91.8  MCH 27.7 27.4 27.9  MCHC 30.7 30.1 30.3  RDW 15.2 15.5 15.3  PLT 156 192 230    Radiology    Dg Abd Portable 1v  Result Date: 04/16/2016 CLINICAL DATA:  NG tube placement EXAM: PORTABLE ABDOMEN - 1 VIEW COMPARISON:  04/15/2016 FINDINGS: Only the left hemiabdomen is imaged. Midline cutaneous staples. Esophageal tube tip overlies the proximal stomach. Dilated loops of small bowel, measuring up to 5.2 cm in the left lower quadrant. IMPRESSION: Esophageal tube tip overlies the proximal stomach. Dilated loops of small bowel in the left lower quadrant. Electronically Signed   By: Donavan Foil M.D.   On: 04/16/2016 19:52    Patient Profile     66 year old male with past medical history of coronary artery disease, ischemic cardiomyopathy, atrial tachycardia and now status post abdominal surgery we were consulted for tachycardia. Patient has had prior PCI of his right coronary artery in 2010 and LAD in 2016. Last cardiac catheterization November 2017 showed 75% mid LAD and a 90% apical LAD. There is a 50-60% origin of the PDA. Last echocardiogram January 2015 showed ejection fraction 40-45%, mild left ventricular hypertrophy, grade 1 diastolic dysfunction and biatrial enlargement. Nuclear study December 2017 showed ejection fraction 58%. There was  diaphragmatic attenuation versus scar but no ischemia. Patient's history also significant for prior partial colectomy for adenocarcinoma in 2014. He developed atrial tachycardia during that admission and also suffered a cardiac arrest requiring defibrillation. His QT was prolonged felt secondary to hypokalemia and hypomagnesemia. Patient admitted on February 20 and underwent reversal of colostomy. Course complicated by postoperative ileus. Also noted to be tachycardic and cardiology asked to evaluate.   Assessment & Plan    1. Sinus tachycardia  with intermittent atrial tachycardia. Related to beta blocker withdrawal and stress of d=surgery.  2. CAD -patient has history of PCI with DES. Continue Aspirin ans Plavix on hold. Resume when taking PO.  3. A/C kidney disease -renal function near baseline  4. S/P abdominal surgery with  Ileus.   5. Hypertension  - will improve when can resume PO therapy.  6. History of TdP VT - this occurred postoperatively at time of colon cancer resection. No ventricular ectopy on tele. Signed, Sinclair Grooms, MD  04/18/2016, 12:55 PM

## 2016-04-18 NOTE — Progress Notes (Signed)
Physical Therapy Treatment Patient Details Name: Larry [name] MRN: [id number] DOB: Jan 24, 1951 Today's Date: 04/18/2016    History of Present Illness Pt is a 66 yo male several years out from an emergency colectomy with colostomy of the right transverse colon for an obstructing tumor. Pt with significant cardiac issue for which he has been cleared.    s/p 04/12/16 colostomy reversal     PT Comments    Patient is making gradual progress toward mobility goals. Pt needs to work on gait progression/activity tolerance. Continue to progress as tolerated.    Follow Up Recommendations  Home health PT;Supervision/Assistance - 24 hour;Supervision for mobility/OOB     Equipment Recommendations  Rolling walker with 5" wheels;3in1 (PT)    Recommendations for Other Services       Precautions / Restrictions Precautions Precautions: Fall Precaution Comments: deconditioned Restrictions Weight Bearing Restrictions: No    Mobility  Bed Mobility Overal bed mobility: Needs Assistance Bed Mobility: Supine to Sit     Supine to sit: Mod assist;HOB elevated     General bed mobility comments: assist to elevate trunk into sitting; HOB elevated and use of rails  Transfers Overall transfer level: Needs assistance Equipment used: Rolling walker (2 wheeled) Transfers: Sit to/from Stand Sit to Stand: Min assist         General transfer comment: assist to power up and steady upon standing; cues for hand placement  Ambulation/Gait Ambulation/Gait assistance: Min assist;+2 safety/equipment Ambulation Distance (Feet): 16 Feet Assistive device: Rolling walker (2 wheeled) Gait Pattern/deviations: Trunk flexed;Step-through pattern;Decreased stride length Gait velocity: decreased   General Gait Details: cues for safe use of AD and posture   Stairs            Wheelchair Mobility    Modified Rankin (Stroke Patients Only)       Balance Overall balance assessment: Needs  assistance Sitting-balance support: No upper extremity supported;Feet supported Sitting balance-Leahy Scale: Good     Standing balance support: Bilateral upper extremity supported Standing balance-Leahy Scale: Poor                      Cognition Arousal/Alertness: Awake/alert Behavior During Therapy: WFL for tasks assessed/performed Overall Cognitive Status: Within Functional Limits for tasks assessed                      Exercises      General Comments General comments (skin integrity, edema, etc.): HR 100 or < with mobility      Pertinent Vitals/Pain Pain Assessment: Faces Faces Pain Scale: Hurts even more Pain Location: abdominal inscision site Pain Descriptors / Indicators: Grimacing;Tender;Guarding;Sore Pain Intervention(s): Limited activity within patient's tolerance;Monitored during session;Premedicated before session;Repositioned;PCA encouraged    Home Living                      Prior Function            PT Goals (current goals can now be found in the care plan section) Acute Rehab PT Goals Patient Stated Goal: go home soon PT Goal Formulation: With patient Time For Goal Achievement: 04/28/16 Potential to Achieve Goals: Fair Progress towards PT goals: Progressing toward goals    Frequency    Min 3X/week      PT Plan Current plan remains appropriate    Co-evaluation             End of Session Equipment Utilized During Treatment: Gait belt;Oxygen Activity Tolerance: Patient limited by fatigue  Patient left: with call bell/phone within reach;in chair Nurse Communication: Mobility status PT Visit Diagnosis: Other abnormalities of gait and mobility (R26.89);Muscle weakness (generalized) (M62.81);Pain     Time: [id number] PT Time Calculation (min) (ACUTE ONLY): 22 min  Charges:  $Gait Training: 8-22 mins                    G Codes:       Salina April, PTA Pager: [phone]   04/18/2016,  4:38 PM

## 2016-04-18 NOTE — Progress Notes (Signed)
Patient's HR sustaining in 130's-140's while sleeping despite dose of 10 mg of metoprolol at 1143, will give prn dose and monitor.

## 2016-04-18 NOTE — Progress Notes (Signed)
Peripherally Inserted Central Catheter/Midline Placement  The IV Nurse has discussed with the patient and/or persons authorized to consent for the patient, the purpose of this procedure and the potential benefits and risks involved with this procedure.  The benefits include less needle sticks, lab draws from the catheter, and the patient may be discharged home with the catheter. Risks include, but not limited to, infection, bleeding, blood clot (thrombus formation), and puncture of an artery; nerve damage and irregular heartbeat and possibility to perform a PICC exchange if needed/ordered by physician.  Alternatives to this procedure were also discussed.  Bard Power PICC patient education guide, fact sheet on infection prevention and patient information card has been provided to patient /or left at bedside.    PICC/Midline Placement Documentation  PICC Double Lumen [id number] PICC Left Basilic 51 cm 0 cm (Active)  Indication for Insertion or Continuance of Line Administration of hyperosmolar/irritating solutions (i.e. TPN, Vancomycin, etc.) 04/18/2016 11:39 AM  Exposed Catheter (cm) 0 cm 04/18/2016 11:39 AM  Site Assessment Clean;Dry;Intact 04/18/2016 11:39 AM  Dressing Change Due 04/25/16 04/18/2016 11:39 AM       Frances Maywood 04/18/2016, 11:42 AM

## 2016-04-18 NOTE — Progress Notes (Signed)
GS Progress Note Subjective: Patient complaining of lower abdominal pain.  Suprapubic  Objective: Vital signs in last 24 hours: Temp:  [98.5 F (36.9 C)-98.9 F (37.2 C)] 98.9 F (37.2 C) (02/26 0450) Pulse Rate:  [99-137] 103 (02/26 0450) Resp:  [16-22] 18 (02/26 0450) BP: (130-165)/(64-94) 158/64 (02/26 0450) SpO2:  [92 %-96 %] 92 % (02/26 0450)    Intake/Output from previous day: 02/25 0701 - 02/26 0700 In: 2310 [P.O.:250; I.V.:1780; NG/GT:280] Out: 6300 [Urine:2500; Emesis/NG output:3800] Intake/Output this shift: No intake/output data recorded.  Lungs: Clear to auscultation and great excursion.  Oxygen saturations 94%  Abd: Soft, excellent bowel sounds.  Mildly tender in the lower abdomen.  NGT output over 3.8 liters yesterday.  He is taking some ice chips and water and pops.  Extremities: Tenderness in the right calf without much swelling.  Neuro: Intact  Lab Results: CBC   Recent Labs  04/16/16 1125 04/17/16 0530  WBC 9.4 8.5  HGB 11.2* 10.7*  HCT 36.5* 35.6*  PLT 156 192   BMET  Recent Labs  04/16/16 1125 04/17/16 0530  NA 149* 152*  K 3.9 4.0  CL 105 106  CO2 35* 34*  GLUCOSE 109* 92  BUN 33* 26*  CREATININE 2.08* 1.80*  CALCIUM 9.5 9.3   PT/INR No results for input(s): LABPROT, INR in the last 72 hours. ABG No results for input(s): PHART, HCO3 in the last 72 hours.  Invalid input(s): PCO2, PO2  Studies/Results: Dg Abd Portable 1v  Result Date: 04/16/2016 CLINICAL DATA:  NG tube placement EXAM: PORTABLE ABDOMEN - 1 VIEW COMPARISON:  04/15/2016 FINDINGS: Only the left hemiabdomen is imaged. Midline cutaneous staples. Esophageal tube tip overlies the proximal stomach. Dilated loops of small bowel, measuring up to 5.2 cm in the left lower quadrant. IMPRESSION: Esophageal tube tip overlies the proximal stomach. Dilated loops of small bowel in the left lower quadrant. Electronically Signed   By: Donavan Foil M.D.   On: 04/16/2016 19:52     Anti-infectives: Anti-infectives    Start     Dose/Rate Route Frequency Ordered Stop   04/12/16 2000  cefoTEtan (CEFOTAN) 2 g in dextrose 5 % 50 mL IVPB     2 g 100 mL/hr over 30 Minutes Intravenous Every 12 hours 04/12/16 1120 04/12/16 2125   04/12/16 0700  cefoTEtan (CEFOTAN) 2 g in dextrose 5 % 50 mL IVPB     2 g 100 mL/hr over 30 Minutes Intravenous To ShortStay Surgical 04/11/16 1404 04/12/16 0753   04/12/16 0548  cefoTEtan in Dextrose 5% (CEFOTAN) 2-2.08 GM-% IVPB    Comments:  Precious Haws   : cabinet override      04/12/16 0548 04/12/16 1759   04/12/16 0544  neomycin (MYCIFRADIN) tablet 1,000 mg  Status:  Discontinued     1,000 mg Oral 3 times per day 04/12/16 0544 04/12/16 0547   04/12/16 0544  metroNIDAZOLE (FLAGYL) tablet 1,000 mg  Status:  Discontinued     1,000 mg Oral 3 times per day 04/12/16 0544 04/12/16 0547      Assessment/Plan: s/p Procedure(s): COLOSTOMY REVERSAL PICC line and TPN.  Duplex venous study  LOS: 6 days    Kathryne Eriksson. Dahlia Bailiff, MD, FACS [phone] [facility] 04/18/2016

## 2016-04-19 ENCOUNTER — Inpatient Hospital Stay (HOSPITAL_COMMUNITY): Payer: Medicare Other | Admitting: Anesthesiology

## 2016-04-19 ENCOUNTER — Encounter (HOSPITAL_COMMUNITY)
Admission: RE | Disposition: A | Discharge status: Nursing Home (Includes ICF) | Payer: Self-pay | Source: Ambulatory Visit | Attending: General Surgery

## 2016-04-19 ENCOUNTER — Inpatient Hospital Stay (HOSPITAL_COMMUNITY): Payer: Medicare Other

## 2016-04-19 ENCOUNTER — Encounter (HOSPITAL_COMMUNITY): Payer: Medicare Other

## 2016-04-19 ENCOUNTER — Encounter (HOSPITAL_COMMUNITY): Payer: Self-pay | Admitting: Anesthesiology

## 2016-04-19 DIAGNOSIS — R6521 Severe sepsis with septic shock: Secondary | ICD-10-CM

## 2016-04-19 DIAGNOSIS — J9621 Acute and chronic respiratory failure with hypoxia: Secondary | ICD-10-CM

## 2016-04-19 DIAGNOSIS — I472 Ventricular tachycardia: Secondary | ICD-10-CM

## 2016-04-19 DIAGNOSIS — A419 Sepsis, unspecified organism: Secondary | ICD-10-CM

## 2016-04-19 DIAGNOSIS — J9622 Acute and chronic respiratory failure with hypercapnia: Secondary | ICD-10-CM

## 2016-04-19 DIAGNOSIS — N179 Acute kidney failure, unspecified: Secondary | ICD-10-CM

## 2016-04-19 DIAGNOSIS — K659 Peritonitis, unspecified: Secondary | ICD-10-CM

## 2016-04-19 DIAGNOSIS — Z6841 Body Mass Index (BMI) 40.0 and over, adult: Secondary | ICD-10-CM

## 2016-04-19 DIAGNOSIS — G4733 Obstructive sleep apnea (adult) (pediatric): Secondary | ICD-10-CM

## 2016-04-19 HISTORY — PX: LAPAROTOMY: SHX154

## 2016-04-19 HISTORY — PX: COLOSTOMY: SHX63

## 2016-04-19 LAB — POCT I-STAT 7, (LYTES, BLD GAS, ICA,H+H)
Acid-Base Excess: 8 mmol/L — ABNORMAL HIGH (ref 0.0–2.0)
Bicarbonate: 34.4 mmol/L — ABNORMAL HIGH (ref 20.0–28.0)
Calcium, Ion: 1.1 mmol/L — ABNORMAL LOW (ref 1.15–1.40)
HCT: 34 % — ABNORMAL LOW (ref 39.0–52.0)
HEMOGLOBIN: 11.6 g/dL — AB (ref 13.0–17.0)
O2 Saturation: 100 %
PCO2 ART: 58 mmHg — AB (ref 32.0–48.0)
PO2 ART: 362 mmHg — AB (ref 83.0–108.0)
Potassium: 4.5 mmol/L (ref 3.5–5.1)
Sodium: 145 mmol/L (ref 135–145)
TCO2: 36 mmol/L (ref 0–100)
pH, Arterial: 7.381 (ref 7.350–7.450)

## 2016-04-19 LAB — CBC WITH DIFFERENTIAL/PLATELET
Basophils Absolute: 0 10*3/uL (ref 0.0–0.1)
Basophils Absolute: 0 10*3/uL (ref 0.0–0.1)
Basophils Relative: 0 %
Basophils Relative: 0 %
EOS ABS: 0 10*3/uL (ref 0.0–0.7)
EOS ABS: 0 10*3/uL (ref 0.0–0.7)
Eosinophils Relative: 1 %
Eosinophils Relative: 0 %
HCT: 42.9 % (ref 39.0–52.0)
HCT: 38.8 % — ABNORMAL LOW (ref 39.0–52.0)
HEMOGLOBIN: 12.7 g/dL — AB (ref 13.0–17.0)
HEMOGLOBIN: 11.6 g/dL — AB (ref 13.0–17.0)
LYMPHS ABS: 0.6 10*3/uL — AB (ref 0.7–4.0)
LYMPHS ABS: 0.6 10*3/uL — AB (ref 0.7–4.0)
LYMPHS PCT: 6 %
Lymphocytes Relative: 12 %
MCH: 27.4 pg (ref 26.0–34.0)
MCH: 27.5 pg (ref 26.0–34.0)
MCHC: 29.6 g/dL — ABNORMAL LOW (ref 30.0–36.0)
MCHC: 29.9 g/dL — ABNORMAL LOW (ref 30.0–36.0)
MCV: 92.7 fL (ref 78.0–100.0)
MCV: 91.9 fL (ref 78.0–100.0)
Monocytes Absolute: 0.4 10*3/uL (ref 0.1–1.0)
Monocytes Absolute: 0.6 10*3/uL (ref 0.1–1.0)
Monocytes Relative: 8 %
Monocytes Relative: 6 %
NEUTROS PCT: 88 %
Neutro Abs: 3.7 10*3/uL (ref 1.7–7.7)
Neutro Abs: 9.8 10*3/uL — ABNORMAL HIGH (ref 1.7–7.7)
Neutrophils Relative %: 79 %
Platelets: 206 10*3/uL (ref 150–400)
Platelets: 208 10*3/uL (ref 150–400)
RBC: 4.63 MIL/uL (ref 4.22–5.81)
RBC: 4.22 MIL/uL (ref 4.22–5.81)
RDW: 15.7 % — ABNORMAL HIGH (ref 11.5–15.5)
RDW: 15.3 % (ref 11.5–15.5)
WBC: 4.7 10*3/uL (ref 4.0–10.5)
WBC: 11.1 10*3/uL — AB (ref 4.0–10.5)

## 2016-04-19 LAB — POCT I-STAT 3, ART BLOOD GAS (G3+)
ACID-BASE EXCESS: 5 mmol/L — AB (ref 0.0–2.0)
Acid-Base Excess: 5 mmol/L — ABNORMAL HIGH (ref 0.0–2.0)
BICARBONATE: 31 mmol/L — AB (ref 20.0–28.0)
Bicarbonate: 32.6 mmol/L — ABNORMAL HIGH (ref 20.0–28.0)
O2 SAT: 92 %
O2 Saturation: 91 %
PCO2 ART: 50.9 mmHg — AB (ref 32.0–48.0)
PO2 ART: 63 mmHg — AB (ref 83.0–108.0)
Patient temperature: 99.5
TCO2: 34 mmol/L (ref 0–100)
TCO2: 32 mmol/L (ref 0–100)
pCO2 arterial: 63.2 mmHg — ABNORMAL HIGH (ref 32.0–48.0)
pH, Arterial: 7.323 — ABNORMAL LOW (ref 7.350–7.450)
pH, Arterial: 7.395 (ref 7.350–7.450)
pO2, Arterial: 72 mmHg — ABNORMAL LOW (ref 83.0–108.0)

## 2016-04-19 LAB — COMPREHENSIVE METABOLIC PANEL
ALK PHOS: 61 U/L (ref 38–126)
ALT: 32 U/L (ref 17–63)
AST: 29 U/L (ref 15–41)
Albumin: 2.8 g/dL — ABNORMAL LOW (ref 3.5–5.0)
Anion gap: 10 (ref 5–15)
BUN: 31 mg/dL — ABNORMAL HIGH (ref 6–20)
CALCIUM: 9.1 mg/dL (ref 8.9–10.3)
CO2: 35 mmol/L — AB (ref 22–32)
CREATININE: 2.16 mg/dL — AB (ref 0.61–1.24)
Chloride: 103 mmol/L (ref 101–111)
GFR calc Af Amer: 36 mL/min — ABNORMAL LOW (ref >60)
GFR calc non Af Amer: 31 mL/min — ABNORMAL LOW (ref >60)
GLUCOSE: 141 mg/dL — AB (ref 65–99)
Potassium: 3.8 mmol/L (ref 3.5–5.1)
SODIUM: 148 mmol/L — AB (ref 135–145)
Total Bilirubin: 1.5 mg/dL — ABNORMAL HIGH (ref 0.3–1.2)
Total Protein: 6.5 g/dL (ref 6.5–8.1)

## 2016-04-19 LAB — BASIC METABOLIC PANEL
ANION GAP: 9 (ref 5–15)
BUN: 36 mg/dL — ABNORMAL HIGH (ref 6–20)
CO2: 31 mmol/L (ref 22–32)
Calcium: 8.5 mg/dL — ABNORMAL LOW (ref 8.9–10.3)
Chloride: 105 mmol/L (ref 101–111)
Creatinine, Ser: 3.36 mg/dL — ABNORMAL HIGH (ref 0.61–1.24)
GFR calc non Af Amer: 18 mL/min — ABNORMAL LOW (ref >60)
GFR, EST AFRICAN AMERICAN: 21 mL/min — AB (ref >60)
Glucose, Bld: 136 mg/dL — ABNORMAL HIGH (ref 65–99)
POTASSIUM: 4.5 mmol/L (ref 3.5–5.1)
Sodium: 145 mmol/L (ref 135–145)

## 2016-04-19 LAB — PROCALCITONIN: Procalcitonin: 18.31 ng/mL

## 2016-04-19 LAB — TROPONIN I: Troponin I: 0.15 ng/mL (ref <0.03)

## 2016-04-19 LAB — LACTIC ACID, PLASMA: Lactic Acid, Venous: 1.6 mmol/L (ref 0.5–1.9)

## 2016-04-19 LAB — GLUCOSE, CAPILLARY
GLUCOSE-CAPILLARY: 137 mg/dL — AB (ref 65–99)
Glucose-Capillary: 129 mg/dL — ABNORMAL HIGH (ref 65–99)
Glucose-Capillary: 119 mg/dL — ABNORMAL HIGH (ref 65–99)

## 2016-04-19 LAB — MAGNESIUM: Magnesium: 2.2 mg/dL (ref 1.7–2.4)

## 2016-04-19 LAB — CORTISOL: Cortisol, Plasma: 40.7 ug/dL

## 2016-04-19 LAB — PREALBUMIN: Prealbumin: 21 mg/dL (ref 18–38)

## 2016-04-19 LAB — TRIGLYCERIDES
TRIGLYCERIDES: 117 mg/dL (ref <150)
Triglycerides: 189 mg/dL — ABNORMAL HIGH (ref <150)

## 2016-04-19 LAB — PHOSPHORUS: PHOSPHORUS: 3.1 mg/dL (ref 2.5–4.6)

## 2016-04-19 SURGERY — LAPAROTOMY, EXPLORATORY
Anesthesia: General | Site: Abdomen

## 2016-04-19 MED ORDER — PROPOFOL 500 MG/50ML IV EMUL
INTRAVENOUS | Status: DC | PRN
  Administered 2016-04-19: 50 ug/kg/min via INTRAVENOUS

## 2016-04-19 MED ORDER — MIDAZOLAM HCL 2 MG/2ML IJ SOLN
2.0000 mg | INTRAMUSCULAR | Status: DC | PRN
  Administered 2016-04-21 – 2016-04-22 (×4): 2 mg via INTRAVENOUS
  Filled 2016-04-19 (×5): qty 2

## 2016-04-19 MED ORDER — PROPOFOL 10 MG/ML IV BOLUS
INTRAVENOUS | Status: DC | PRN
  Administered 2016-04-19: 110 mg via INTRAVENOUS

## 2016-04-19 MED ORDER — ALBUMIN HUMAN 5 % IV SOLN
INTRAVENOUS | Status: DC | PRN
  Administered 2016-04-19 (×2): via INTRAVENOUS

## 2016-04-19 MED ORDER — M.V.I. ADULT IV INJ
INJECTION | INTRAVENOUS | Status: AC
  Administered 2016-04-19 (×2): via INTRAVENOUS
  Filled 2016-04-19: qty 1920

## 2016-04-19 MED ORDER — POTASSIUM CHLORIDE IN NACL 20-0.45 MEQ/L-% IV SOLN: INTRAVENOUS | Status: DC

## 2016-04-19 MED ORDER — SODIUM CHLORIDE 0.9 % IV BOLUS (SEPSIS)
1000.0000 mL | Freq: Once | INTRAVENOUS | Status: AC
  Administered 2016-04-19: 1000 mL via INTRAVENOUS

## 2016-04-19 MED ORDER — MIDAZOLAM HCL 2 MG/2ML IJ SOLN
INTRAMUSCULAR | Status: AC
  Filled 2016-04-19: qty 2

## 2016-04-19 MED ORDER — PIPERACILLIN-TAZOBACTAM 3.375 G IVPB
3.3750 g | Freq: Three times a day (TID) | INTRAVENOUS | Status: AC
  Administered 2016-04-19 – 2016-05-02 (×40): 3.375 g via INTRAVENOUS
  Filled 2016-04-19 (×43): qty 50

## 2016-04-19 MED ORDER — PROPOFOL 1000 MG/100ML IV EMUL
5.0000 ug/kg/min | INTRAVENOUS | Status: DC
Start: 2016-04-19 — End: 2016-04-23
  Administered 2016-04-19: 15 ug/kg/min via INTRAVENOUS
  Administered 2016-04-20 (×5): 20 ug/kg/min via INTRAVENOUS
  Administered 2016-04-20: 10 ug/kg/min via INTRAVENOUS
  Administered 2016-04-21: 30 ug/kg/min via INTRAVENOUS
  Administered 2016-04-21 (×2): 35 ug/kg/min via INTRAVENOUS
  Administered 2016-04-21 (×2): 20 ug/kg/min via INTRAVENOUS
  Administered 2016-04-21: 35 ug/kg/min via INTRAVENOUS
  Filled 2016-04-19 (×12): qty 100

## 2016-04-19 MED ORDER — FENTANYL CITRATE (PF) 100 MCG/2ML IJ SOLN
50.0000 ug | Freq: Once | INTRAMUSCULAR | Status: AC
  Administered 2016-04-19: 50 ug via INTRAVENOUS

## 2016-04-19 MED ORDER — SODIUM CHLORIDE 0.9 % IV BOLUS (SEPSIS)
500.0000 mL | Freq: Once | INTRAVENOUS | Status: AC
  Administered 2016-04-19: 500 mL via INTRAVENOUS

## 2016-04-19 MED ORDER — POVIDONE-IODINE 10 % EX OINT: TOPICAL_OINTMENT | CUTANEOUS | Status: DC | PRN

## 2016-04-19 MED ORDER — DEXMEDETOMIDINE HCL IN NACL 200 MCG/50ML IV SOLN
0.0000 ug/kg/h | INTRAVENOUS | Status: DC
  Administered 2016-04-19: 0.6 ug/kg/h via INTRAVENOUS
  Filled 2016-04-19: qty 50

## 2016-04-19 MED ORDER — SODIUM CHLORIDE 0.9 % IV SOLN
25.0000 ug/h | INTRAVENOUS | Status: DC
  Administered 2016-04-19: 100 ug/h via INTRAVENOUS
  Administered 2016-04-20: 50 ug/h via INTRAVENOUS
  Administered 2016-04-20: 150 ug/h via INTRAVENOUS
  Administered 2016-04-20: 200 ug/h via INTRAVENOUS
  Administered 2016-04-21: 350 ug/h via INTRAVENOUS
  Administered 2016-04-21: 250 ug/h via INTRAVENOUS
  Administered 2016-04-21: 400 ug/h via INTRAVENOUS
  Administered 2016-04-22: 350 ug/h via INTRAVENOUS
  Filled 2016-04-19 (×8): qty 50

## 2016-04-19 MED ORDER — ROCURONIUM BROMIDE 50 MG/5ML IV SOSY
PREFILLED_SYRINGE | INTRAVENOUS | Status: AC
  Filled 2016-04-19: qty 5

## 2016-04-19 MED ORDER — FAT EMULSION 20 % IV EMUL
240.0000 mL | INTRAVENOUS | Status: AC
  Administered 2016-04-19: 240 mL via INTRAVENOUS
  Filled 2016-04-19: qty 250

## 2016-04-19 MED ORDER — CEFOTETAN DISODIUM 2 G IJ SOLR
INTRAMUSCULAR | Status: DC | PRN
  Administered 2016-04-19: 2 g via INTRAVENOUS

## 2016-04-19 MED ORDER — FENTANYL CITRATE (PF) 100 MCG/2ML IJ SOLN
INTRAMUSCULAR | Status: DC | PRN
  Administered 2016-04-19: 150 ug via INTRAVENOUS
  Administered 2016-04-19 (×2): 25 ug via INTRAVENOUS

## 2016-04-19 MED ORDER — ORAL CARE MOUTH RINSE
15.0000 mL | Freq: Four times a day (QID) | OROMUCOSAL | Status: DC
  Administered 2016-04-20 – 2016-04-28 (×36): 15 mL via OROMUCOSAL

## 2016-04-19 MED ORDER — ROCURONIUM BROMIDE 100 MG/10ML IV SOLN
INTRAVENOUS | Status: DC | PRN
  Administered 2016-04-19 (×2): 50 mg via INTRAVENOUS
  Administered 2016-04-19: 20 mg via INTRAVENOUS

## 2016-04-19 MED ORDER — BETHANECHOL CHLORIDE 10 MG PO TABS
10.0000 mg | ORAL_TABLET | Freq: Three times a day (TID) | ORAL | Status: DC
  Administered 2016-04-22 – 2016-05-13 (×64): 10 mg
  Filled 2016-04-19 (×74): qty 1

## 2016-04-19 MED ORDER — CHLORHEXIDINE GLUCONATE 0.12% ORAL RINSE (MEDLINE KIT)
15.0000 mL | Freq: Two times a day (BID) | OROMUCOSAL | Status: DC
  Administered 2016-04-19 – 2016-04-28 (×18): 15 mL via OROMUCOSAL

## 2016-04-19 MED ORDER — SUCCINYLCHOLINE CHLORIDE 20 MG/ML IJ SOLN
INTRAMUSCULAR | Status: DC | PRN
  Administered 2016-04-19: 200 mg via INTRAVENOUS

## 2016-04-19 MED ORDER — PANTOPRAZOLE SODIUM 40 MG IV SOLR
40.0000 mg | Freq: Two times a day (BID) | INTRAVENOUS | Status: DC
  Administered 2016-04-19 – 2016-05-04 (×30): 40 mg via INTRAVENOUS
  Filled 2016-04-19 (×30): qty 40

## 2016-04-19 MED ORDER — FENTANYL CITRATE (PF) 100 MCG/2ML IJ SOLN
INTRAMUSCULAR | Status: AC
  Filled 2016-04-19: qty 4

## 2016-04-19 MED ORDER — MIDAZOLAM HCL 2 MG/2ML IJ SOLN: 2.0000 mg | INTRAMUSCULAR | Status: DC | PRN

## 2016-04-19 MED ORDER — LACTATED RINGERS IV SOLN
INTRAVENOUS | Status: DC
  Administered 2016-04-19: 12:00:00 via INTRAVENOUS

## 2016-04-19 MED ORDER — NOREPINEPHRINE BITARTRATE 1 MG/ML IV SOLN
0.0000 ug/min | INTRAVENOUS | Status: DC
  Administered 2016-04-19: 2 ug/min via INTRAVENOUS
  Administered 2016-04-20: 10 ug/min via INTRAVENOUS
  Administered 2016-04-20: 7 ug/min via INTRAVENOUS
  Filled 2016-04-19 (×3): qty 4

## 2016-04-19 MED ORDER — MIDAZOLAM HCL 5 MG/5ML IJ SOLN
INTRAMUSCULAR | Status: DC | PRN
  Administered 2016-04-19: 2 mg via INTRAVENOUS

## 2016-04-19 MED ORDER — ARTIFICIAL TEARS OP OINT
TOPICAL_OINTMENT | OPHTHALMIC | Status: DC | PRN
  Administered 2016-04-19: 1 via OPHTHALMIC

## 2016-04-19 MED ORDER — SODIUM CHLORIDE 0.9 % IV SOLN
INTRAVENOUS | Status: DC
  Administered 2016-04-19 – 2016-04-21 (×4): via INTRAVENOUS
  Administered 2016-04-27: 10 mL/h via INTRAVENOUS

## 2016-04-19 MED ORDER — POTASSIUM CHLORIDE 10 MEQ/50ML IV SOLN
10.0000 meq | INTRAVENOUS | Status: DC
  Administered 2016-04-19: 10 meq via INTRAVENOUS
  Filled 2016-04-19 (×2): qty 50

## 2016-04-19 MED ORDER — FENTANYL BOLUS VIA INFUSION
50.0000 ug | INTRAVENOUS | Status: DC | PRN
  Administered 2016-04-22 – 2016-05-11 (×13): 50 ug via INTRAVENOUS
  Filled 2016-04-19: qty 50

## 2016-04-19 MED ORDER — LACTATED RINGERS IV SOLN
INTRAVENOUS | Status: DC | PRN
  Administered 2016-04-19 (×2): via INTRAVENOUS

## 2016-04-19 MED ORDER — 0.9 % SODIUM CHLORIDE (POUR BTL) OPTIME
TOPICAL | Status: DC | PRN
  Administered 2016-04-19 (×5): 1000 mL

## 2016-04-19 MED ORDER — PHENYLEPHRINE HCL 10 MG/ML IJ SOLN
INTRAVENOUS | Status: DC | PRN
  Administered 2016-04-19: 100 ug/min via INTRAVENOUS

## 2016-04-19 MED ORDER — ESMOLOL HCL 100 MG/10ML IV SOLN
INTRAVENOUS | Status: DC | PRN
  Administered 2016-04-19: 10 mg via INTRAVENOUS

## 2016-04-19 MED ORDER — POVIDONE-IODINE 10 % EX OINT
TOPICAL_OINTMENT | CUTANEOUS | Status: AC
Start: 2016-04-19 — End: 2016-04-19
  Filled 2016-04-19: qty 28.35

## 2016-04-19 MED ORDER — PROPOFOL 10 MG/ML IV BOLUS
INTRAVENOUS | Status: AC
  Filled 2016-04-19: qty 20

## 2016-04-19 MED ORDER — PHENYLEPHRINE HCL 10 MG/ML IJ SOLN
INTRAMUSCULAR | Status: DC | PRN
  Administered 2016-04-19 (×3): 80 ug via INTRAVENOUS

## 2016-04-19 MED ORDER — CEFOTETAN DISODIUM-DEXTROSE 2-2.08 GM-% IV SOLR
INTRAVENOUS | Status: AC
  Filled 2016-04-19: qty 50

## 2016-04-19 SURGICAL SUPPLY — 51 items
BLADE CLIPPER SURG (BLADE) IMPLANT
BRIDGE OSTOMY 3 1/2 (OSTOMY) ×2 IMPLANT
CANISTER SUCT 3000ML PPV (MISCELLANEOUS) ×4 IMPLANT
CHLORAPREP W/TINT 26ML (MISCELLANEOUS) IMPLANT
COVER SURGICAL LIGHT HANDLE (MISCELLANEOUS) ×2 IMPLANT
DRAIN CHANNEL 19F RND (DRAIN) ×2 IMPLANT
DRAPE LAPAROSCOPIC ABDOMINAL (DRAPES) ×2 IMPLANT
DRAPE WARM FLUID 44X44 (DRAPE) ×2 IMPLANT
DRSG OPSITE POSTOP 4X10 (GAUZE/BANDAGES/DRESSINGS) IMPLANT
DRSG OPSITE POSTOP 4X8 (GAUZE/BANDAGES/DRESSINGS) IMPLANT
ELECT BLADE 6.5 EXT (BLADE) IMPLANT
ELECT CAUTERY BLADE 6.4 (BLADE) ×2 IMPLANT
ELECT REM PT RETURN 9FT ADLT (ELECTROSURGICAL) ×2
ELECTRODE REM PT RTRN 9FT ADLT (ELECTROSURGICAL) ×1 IMPLANT
EVACUATOR SILICONE 100CC (DRAIN) ×2 IMPLANT
GLOVE BIO SURGEON STRL SZ 6 (GLOVE) ×2 IMPLANT
GLOVE BIOGEL PI IND STRL 6.5 (GLOVE) ×1 IMPLANT
GLOVE BIOGEL PI IND STRL 8 (GLOVE) ×2 IMPLANT
GLOVE BIOGEL PI INDICATOR 6.5 (GLOVE) ×1
GLOVE BIOGEL PI INDICATOR 8 (GLOVE) ×2
GLOVE ECLIPSE 7.5 STRL STRAW (GLOVE) ×4 IMPLANT
GLOVE SURG SS PI 8.0 STRL IVOR (GLOVE) ×4 IMPLANT
GOWN STRL REUS W/ TWL LRG LVL3 (GOWN DISPOSABLE) ×1 IMPLANT
GOWN STRL REUS W/TWL 2XL LVL3 (GOWN DISPOSABLE) ×2 IMPLANT
GOWN STRL REUS W/TWL LRG LVL3 (GOWN DISPOSABLE) ×1
HOVERMATT SINGLE USE (MISCELLANEOUS) ×2 IMPLANT
KIT BASIN OR (CUSTOM PROCEDURE TRAY) ×2 IMPLANT
KIT OSTOMY DRAINABLE 2.75 STR (WOUND CARE) ×2 IMPLANT
KIT REMOVER STAPLE SKIN (MISCELLANEOUS) ×2 IMPLANT
KIT ROOM TURNOVER OR (KITS) ×2 IMPLANT
LIGASURE IMPACT 36 18CM CVD LR (INSTRUMENTS) IMPLANT
NS IRRIG 1000ML POUR BTL (IV SOLUTION) ×10 IMPLANT
PACK GENERAL/GYN (CUSTOM PROCEDURE TRAY) ×2 IMPLANT
PAD ARMBOARD 7.5X6 YLW CONV (MISCELLANEOUS) ×2 IMPLANT
SEPRAFILM PROCEDURAL PACK 3X5 (MISCELLANEOUS) ×2 IMPLANT
SPECIMEN JAR LARGE (MISCELLANEOUS) IMPLANT
SPONGE LAP 18X18 X RAY DECT (DISPOSABLE) IMPLANT
STAPLER VISISTAT 35W (STAPLE) IMPLANT
SUCTION POOLE TIP (SUCTIONS) ×2 IMPLANT
SUT ETHILON 2 0 FS 18 (SUTURE) ×2 IMPLANT
SUT ETHILON 3 0 FSL (SUTURE) ×2 IMPLANT
SUT NOVA 1 T20/GS 25DT (SUTURE) ×2 IMPLANT
SUT PDS AB 1 TP1 96 (SUTURE) ×4 IMPLANT
SUT SILK 2 0 SH CR/8 (SUTURE) ×2 IMPLANT
SUT SILK 2 0 TIES 10X30 (SUTURE) ×2 IMPLANT
SUT SILK 3 0 SH CR/8 (SUTURE) ×2 IMPLANT
SUT SILK 3 0 TIES 10X30 (SUTURE) ×2 IMPLANT
SUT VIC AB 3-0 SH 18 (SUTURE) ×2 IMPLANT
TOWEL OR 17X26 10 PK STRL BLUE (TOWEL DISPOSABLE) ×2 IMPLANT
TRAY FOLEY W/METER SILVER 16FR (SET/KITS/TRAYS/PACK) IMPLANT
YANKAUER SUCT BULB TIP NO VENT (SUCTIONS) IMPLANT

## 2016-04-19 NOTE — Transfer of Care (Signed)
Immediate Anesthesia Transfer of Care Note  Patient: Larry [name]  Procedure(s) Performed: Procedure(s): EXPLORATORY LAPAROTOMY, REPAIR OF ANASTAMOTIC LEAK (N/A) COLOSTOMY (N/A)  Patient Location: SICU  Anesthesia Type:General  Level of Consciousness: sedated  Airway & Oxygen Therapy: Patient remains intubated per anesthesia plan and Patient placed on Ventilator (see vital sign flow sheet for setting)  Post-op Assessment: Report given to RN and Post -op Vital signs reviewed and stable  Post vital signs: Reviewed and stable  Last Vitals:  Vitals:   04/19/16 0825 04/19/16 1130  BP:  90/76  Pulse:  (!) 114  Resp: (!) 27   Temp:      Last Pain:  Vitals:   04/19/16 0501  TempSrc: Oral  PainSc:       Patients Stated Pain Goal: 2 ([id number])  Complications: No apparent anesthesia complications

## 2016-04-19 NOTE — Progress Notes (Signed)
Notified Dr. Marcie Bal of labs. per Dr. Marcie Bal give patient LR. IV team is coming to d/c lipids

## 2016-04-19 NOTE — Progress Notes (Addendum)
Initial Nutrition Assessment  DOCUMENTATION CODES:   Morbid obesity  INTERVENTION:    TPN per pharmacy  NUTRITION DIAGNOSIS:   Inadequate oral intake related to inability to eat as evidenced by NPO status  GOAL:   Provide needs based on ASPEN/SCCM guidelines  MONITOR:   Diet advancement, Labs, Weight trends, I & O's, TPN prescription   REASON FOR ASSESSMENT:   Consult New TPN/TNA  ASSESSMENT:   66 yo Male who is several years out from an emergency colectomy with colostomy of the right transverse colon for an obstructing tumor; presented for reversal of his colostomy.  Pt s/p procedure 2/21: REVERSAL OF COLOSTOMY  Patient is currently intubated on ventilator support Temp (24hrs), Avg:99.8 F (37.7 C), Min:98.7 F (37.1 C), Max:101.1 F (38.4 C)  Propofol: 20.2 ml/hr >> 533 fat kcals   Pt has been NPO/Clear Liquids since admission (x 7 days). Post-op had emesis and distended abdomen  TPN started for prolonged ileus. Labs and medications reviewed. CBG's [id number]. NGT to LIS.  Pt is receiving TPN via PICC with Clinimix E 5/15 @ 83 ml/hr.  Lipids on hold given Propofol use.  TPN + Propofol providing 1947 kcal and 100 grams protein per day.  Meets 100% minimum estimated energy needs and 53% minimum estimated protein needs.  Diet Order:  TPN (CLINIMIX-E) Adult Diet NPO time specified .TPN (CLINIMIX-E) Adult  Skin:  Reviewed, no issues  Last BM:  N/A  Height:   Ht Readings from Last 1 Encounters:  04/18/16 5' 10.5" (1.791 m)    Weight:   Wt Readings from Last 1 Encounters:  04/20/16 (!) 373 lb 7.4 oz (169.4 kg)    Ideal Body Weight:  75 kg  BMI:  Body mass index is 52.83 kg/m.  Estimated Nutritional Needs:   Kcal:  [id number]  Protein:  >/= 187 gm  Fluid:  per MD  EDUCATION NEEDS:   No education needs identified at this time  [name], RD, LDN Pager #: [phone] After-Hours Pager #: [phone]

## 2016-04-19 NOTE — Anesthesia Procedure Notes (Signed)
Procedure Name: Intubation Date/Time: 04/19/2016 12:54 PM Performed by: Suzy Bouchard Pre-anesthesia Checklist: Emergency Drugs available, Suction available, Patient being monitored, Timeout performed and Patient identified Patient Re-evaluated:Patient Re-evaluated prior to inductionOxygen Delivery Method: Circle system utilized Preoxygenation: Pre-oxygenation with 100% oxygen Intubation Type: IV induction, Rapid sequence and Cricoid Pressure applied Laryngoscope Size: Glidescope and 3 Grade View: Grade I Tube type: Oral Tube size: 7.5 mm Number of attempts: 1 Airway Equipment and Method: Stylet and Video-laryngoscopy Placement Confirmation: ETT inserted through vocal cords under direct vision,  breath sounds checked- equal and bilateral and positive ETCO2 Secured at: 23 cm Tube secured with: Tape Dental Injury: Teeth and Oropharynx as per pre-operative assessment  Difficulty Due To: Difficult Airway-  due to neck instability, Difficult Airway- due to large tongue and Difficulty was anticipated Future Recommendations: Recommend- induction with short-acting agent, and alternative techniques readily available

## 2016-04-19 NOTE — Progress Notes (Addendum)
[facility] Progress Note Patient Name: Larry [name] DOB: Jul 05, 1950 MRN: [id number]   Date of Service  04/19/2016  HPI/Events of Note  Pt admitted for colostomy reversal on 2/20 Back to OR 2/27 due to anastomic leak Now in ICU on vent On propofol, fentanyl Blood pressure 108/75, pulse (!) 156. Sinus on tele Asked by nursing to assess  eICU Interventions  Check EKG, troponin, LA Chest X ray, repeat ABG NS 500cc bolus  Rest of management per surgery     Intervention Category Major Interventions: Hypotension - evaluation and management  Lary Eckardt 04/19/2016, 6:11 PM

## 2016-04-19 NOTE — Progress Notes (Signed)
RT NOTE:  PEEP increased to 8, FiO2 increased to 60% following ABG. MD gave verbal order change to [facility], [place]. Adjustments made, patient tolerating.

## 2016-04-19 NOTE — Anesthesia Preprocedure Evaluation (Addendum)
Anesthesia Evaluation  Patient identified by MRN, date of birth, ID band Patient awake    Reviewed: Allergy & Precautions, H&P , NPO status , Patient's Chart, lab work & pertinent test results  Airway Mallampati: II   Neck ROM: full    Dental  (+) Teeth Intact, Dental Advisory Given,    Pulmonary asthma , sleep apnea , COPD, former smoker,    breath sounds clear to auscultation       Cardiovascular hypertension, + CAD, + Peripheral Vascular Disease and +CHF   Rhythm:regular Rate:Normal     Neuro/Psych    GI/Hepatic   Endo/Other    Renal/GU Renal InsufficiencyRenal disease     Musculoskeletal  (+) Arthritis ,   Abdominal   Peds  Hematology   Anesthesia Other Findings   Reproductive/Obstetrics                            Anesthesia Physical Anesthesia Plan  ASA: III  Anesthesia Plan: General   Post-op Pain Management:    Induction: Intravenous  Airway Management Planned: Oral ETT  Additional Equipment:   Intra-op Plan:   Post-operative Plan: Extubation in OR  Informed Consent: I have reviewed the patients History and Physical, chart, labs and discussed the procedure including the risks, benefits and alternatives for the proposed anesthesia with the patient or authorized representative who has indicated his/her understanding and acceptance.     Plan Discussed with: CRNA, Anesthesiologist and Surgeon  Anesthesia Plan Comments:         Anesthesia Quick Evaluation

## 2016-04-19 NOTE — Consult Note (Signed)
PULMONARY / CRITICAL CARE MEDICINE   Name: Larry [name] MRN: [id number] DOB: 07/13/1950    ADMISSION DATE:  04/12/2016 CONSULTATION DATE:  04/19/2016  REFERRING MD:  Dr. Hulen Skains   CHIEF COMPLAINT: Septic Shock    HISTORY OF PRESENT ILLNESS:   66 year old male with PMH of colon cancer (s/p colostomy), systolic/diastolic HF, CKD stage 3, CAD, HTN, and PAD. Presented to hospital on 2/20 for colostomy reversal. Post operative period was complicated by free air and peritonitis. Patient taken back to OR on 2/27 for Exploratory Lab to repair the anastomotic leak. After OR patient was left intubated, and was hypotensive and tachycardic. PCCM was asked to consult.     PAST MEDICAL HISTORY :  He  has a past medical history of Arthritis; Atrial tachycardia ([place]); Childhood asthma; Chronic combined systolic and diastolic CHF, NYHA class 3 (H[facility]); CKD (chronic kidney disease) stage 3, GFR 30-59 ml/min; Colon cancer ([place]) (2015); COPD (chronic obstructive pulmonary disease) ([place]); Coronary artery disease; Family history of ovarian cancer; Hypercholesteremia; Hypertension; Noncompliance; NSVT (nonsustained ventricular tachycardia) ([place]); Obesity; OSA on CPAP; Peripheral arterial disease ([place]); S/P colostomy ([place]); Thrombocytopenia ([place]); and Torsades de pointes ([place]).  PAST SURGICAL HISTORY: He  has a past surgical history that includes Colonoscopy (N/A, 02/08/2013); Esophagogastroduodenoscopy (N/A, 02/08/2013); laparotomy (N/A, 02/12/2013); laparotomy (N/A, 02/18/2013); Coronary angioplasty with stent (11/12/2008; 06/11/2014); Colon surgery; left heart catheterization with coronary angiogram (N/A, 06/11/2014); Colonoscopy with propofol (N/A, 05/09/2014); Colonoscopy with propofol (N/A, 01/08/2016); Flexible sigmoidoscopy (N/A, 02/19/2016); Colostomy reversal (04/12/2016); Coronary angioplasty with stent (06/11/2014); and Colostomy reversal (N/A, 04/12/2016).  Allergies  Allergen Reactions  . No Known  Allergies     No current facility-administered medications on file prior to encounter.    Current Outpatient Prescriptions on File Prior to Encounter  Medication Sig  . aspirin EC 81 MG tablet Take 1 tablet (81 mg total) by mouth daily.  . clopidogrel (PLAVIX) 75 MG tablet TAKE 1 TABLET (75 MG TOTAL) BY MOUTH DAILY WITH BREAKFAST.  . ferrous sulfate 325 (65 FE) MG tablet Take 1 tablet (325 mg total) by mouth daily with breakfast.  . furosemide (LASIX) 40 MG tablet Take 1 tablet (40 mg total) by mouth daily.  . Magnesium 500 MG CAPS Take 500 mg by mouth daily.  . metoprolol tartrate (LOPRESSOR) 25 MG tablet Take 1.5 tablets (37.5 mg total) by mouth 2 (two) times daily.  . Multiple Vitamins-Minerals (MULTIVITAMIN WITH MINERALS) tablet Take 1 tablet by mouth daily.  . nitroGLYCERIN (NITROSTAT) 0.4 MG SL tablet Place 0.4 mg under the tongue every 5 (five) minutes as needed for chest pain.  . potassium chloride SA (KLOR-CON M20) 20 MEQ tablet Take 1 tablet (20 mEq total) by mouth 2 (two) times daily.  . Simethicone 125 MG TABS Take 125 mg by mouth 4 (four) times daily.  [name] Kitchen spironolactone (ALDACTONE) 25 MG tablet TAKE 1 TABLET (25 MG TOTAL) BY MOUTH DAILY.  . verapamil (CALAN-SR) 240 MG CR tablet Take 1 tablet (240 mg total) by mouth daily.  [name] Kitchen levalbuterol (XOPENEX HFA) 45 MCG/ACT inhaler INHALE 2 PUFFS INTO THE LUNGS EVERY 4 (FOUR) HOURS AS NEEDED FOR WHEEZING.  . sildenafil (VIAGRA) 100 MG tablet Take 0.5 tablets (50 mg total) by mouth daily as needed for erectile dysfunction.    FAMILY HISTORY:  His indicated that his mother is deceased. He indicated that his father is deceased. He indicated that his sister is alive. He indicated that his maternal grandmother is deceased. He indicated that his maternal  grandfather is deceased. He indicated that his paternal grandmother is deceased. He indicated that his paternal grandfather is deceased. He indicated that his paternal aunt is deceased. He  indicated that his paternal uncle is deceased.    SOCIAL HISTORY: He  reports that he quit smoking about 7 years ago. His smoking use included Cigarettes. He has a 38.00 pack-year smoking history. He has never used smokeless tobacco. He reports that he drinks alcohol. He reports that he does not use drugs.  REVIEW OF SYSTEMS:   Unable to review as patient is intubated and sedated   SUBJECTIVE:  Intubated on low dose propofol and fentanyl gtt.   VITAL SIGNS: BP (!) 124/54   Pulse (!) 156   Temp 100.3 F (37.9 C) (Oral)   Resp (!) 29   Ht 5' 10.5" (1.791 m)   Wt (!) 168.5 kg (371 lb 7.6 oz)   SpO2 91%   BMI 52.55 kg/m   HEMODYNAMICS:    VENTILATOR SETTINGS: Vent Mode: PRVC FiO2 (%):  [50 %-60 %] 60 % Set Rate:  [14 bmp] 14 bmp Vt Set:  [580 mL] 580 mL PEEP:  [5 cmH20-8 cmH20] 8 cmH20 Plateau Pressure:  [26 cmH20-31 cmH20] 31 cmH20  INTAKE / OUTPUT: I/O last 3 completed shifts: In: [id number] [P.O.:60; I.V.:5923; NG/GT:50; IV Piggyback:500] Out: 3070 [Urine:1620; Emesis/NG output:1350; Drains:50; Blood:50]  PHYSICAL EXAMINATION: General: Adult male, no distress, lying in bed  Neuro:  Sedated, withdrawals to pain, does not follow commands,  HEENT:  ETT in place  Cardiovascular:  Tachycardiac, no MRG Lungs: Crackles noted to bases, unlabored  Abdomen:  RUQ JP drain in place, distended, hypoactive bowel sounds   Musculoskeletal:  No acute  Skin: warm, dry  LABS:  BMET  Recent Labs Lab 04/18/16 0807 04/19/16 0420 04/19/16 1344 04/19/16 1545  NA 151* 148* 145 145  K 4.1 3.8 4.5 4.5  CL 105 103  --  105  CO2 37* 35*  --  31  BUN 25* 31*  --  36*  CREATININE 1.92* 2.16*  --  3.36*  GLUCOSE 101* 141*  --  136*    Electrolytes  Recent Labs Lab 04/18/16 0807 04/19/16 0420 04/19/16 1545  CALCIUM 9.6 9.1 8.5*  MG 2.4 2.2  --   PHOS 4.0 3.1  --     CBC  Recent Labs Lab 04/18/16 0807 04/19/16 0420 04/19/16 1344 04/19/16 1545  WBC 11.1* 4.7  --  11.1*   HGB 12.2* 12.7* 11.6* 11.6*  HCT 40.2 42.9 34.0* 38.8*  PLT 230 206  --  208    Coag's No results for input(s): APTT, INR in the last 168 hours.  Sepsis Markers  Recent Labs Lab 04/19/16 1838  LATICACIDVEN 1.6    ABG  Recent Labs Lab 04/19/16 1344 04/19/16 1706 04/19/16 1902  PHART 7.381 7.323* 7.395  PCO2ART 58.0* 63.2* 50.9*  PO2ART 362.0* 72.0* 63.0*    Liver Enzymes  Recent Labs Lab 04/19/16 0420  AST 29  ALT 32  ALKPHOS 61  BILITOT 1.5*  ALBUMIN 2.8*    Cardiac Enzymes  Recent Labs Lab 04/19/16 1838  TROPONINI 0.15*    Glucose  Recent Labs Lab 04/18/16 1806 04/18/16 2359 04/19/16 0627 04/19/16 1117 04/19/16 1758  GLUCAP 118* 118* 137* 129* 119*    Imaging Dg Chest Port 1 View  Result Date: 04/19/2016 CLINICAL DATA:  Right IJ line placement EXAM: PORTABLE CHEST 1 VIEW COMPARISON:  04/07/2016 CXR FINDINGS: Heart is top normal. Patient is slightly rotated  with chin obscuring the apices. Right IJ central line catheter is noted projecting toward the expected level of the proximal SVC. No pneumothorax. Small right and small to moderate left pleural effusions are new with adjacent atelectasis. Mild vascular congestion is noted. Endotracheal tube tip projects approximately 4.6 cm above the carina. Gastric tube extends below the left hemidiaphragm although the tip is not visualized on this study. IMPRESSION: Tip of right IJ catheter projects over the region of the proximal SVC. No pneumothorax. Satisfactory endotracheal tube tip position. Gastric tube extends below the left hemidiaphragm though tip is not included on this exam. Cardiomegaly with small right and small to moderate left pleural effusions. Electronically Signed   By: Ashley Royalty M.D.   On: 04/19/2016 19:08   Dg Abd Portable 1v  Result Date: 04/19/2016 CLINICAL DATA:  Abdominal pain. Reversal of colostomy on 04/12/2016. EXAM: PORTABLE ABDOMEN - 1 VIEW COMPARISON:  04/16/2016 and 04/15/2016  and 02/23/2013 FINDINGS: NG tube is been removed. Free air in the abdomen. Slightly dilated small bowel loops in the left mid abdomen persist the stomach is not distended. Colon is not distended. Surgical staples and soft tissue drain are noted. IMPRESSION: Persistent dilatation of small-bowel loops in the left mid abdomen. Moderate free air in the abdomen. Electronically Signed   By: Lorriane Shire M.D.   On: 04/19/2016 10:23     STUDIES:  CXR 2/15 > Chronic bronchitic changes, no acute  KUB 2/23 > Persistent distended loops of bowel including small bowel KUB 2/24 > Dilated loops of small bowel in the left quadrant  KUB 2/27 > Persistent dilation of small-bowel loops in the left mid abdomen. Moderate free air in the abdomen  ECHO 2/24 > 0000000, Normal Systolic Function  CXR [id number] > Cardiomegaly with small right and small to moderate left pleural effusions   CULTURES: Blood 2/27 >> Sputum 2/27 >>  ANTIBIOTICS: Zosyn 2/27 >  SIGNIFICANT EVENTS: 2/20 > Hospital for colostomy reversal  2/23 > Postop ileus > Tachycardia  2/24 > Cardiology Consulted   2/27 > OR for Exploratory Lab   LINES/TUBES: 2/26 PICC >> 2/27 ETT >> 2/27 CVC RIJ >>  DISCUSSION: 66 year old male presents 2/20 for colostomy reversal. On 2/27 was taken back to OR for Exploratory Lab to repair anastamotic leak. Post-OR patient was left intubated. In ICU was hypotensive and tachycardic, requiring fluid boluses and CVP monitoring. PCCM was asked to consult.   ASSESSMENT / PLAN:  PULMONARY A: Acute Hypercarbic/Hypoxic Respiratory Failure  P:   Vent Support Wean as tolerated  Trend CXR  CARDIOVASCULAR A:  Septic Shock  Systolic/Diastolic HF (EF [id number]) [id number] H/O CAD, HTN P:  Cardiac Monitoring  Trend CVP Wean Levophed to maintain MAP >65 Trend Trop  RENAL A:   Acute on Chronic Kidney Injury Stage 3 (Base Crt 1.8-2.4) P:   NS @ 125 ml/hr Trend BMP Replace electrolytes as needed   GASTROINTESTINAL A:    Anastomic Leak s/p exploratory lap 2/27  S/P abdominal ileus  S/P colostomy reversal on 2/20 P:   TPN per pharmacy  NPO  HEMATOLOGIC A:   H/O Colon Cancer s/p resection  P:  Trend CBC  INFECTIOUS A:   Sepsis secondary to ?Abdominal source (recent colostomy with anastomic leak)   P:   PAN Culture Start Zosyn  Trend WBC and Fever Curve  Trend lactic acid   ENDOCRINE A:   No issues  P:   Send Cortisol  Trend Glucose   NEUROLOGIC A:  Acute Encephalopathy secondary to sedation  P:   Wean Propofol and fentanyl gtt to achieve RASS RASS goal: 0/-1   FAMILY  - Updates: no family at beside   - Inter-disciplinary family meet or Palliative Care meeting due by:  3/6  CC Time: 34 minutes   Hayden Pedro, AG-ACNP [facility]  Pgr: [phone]  PCCM Pgr: [phone]

## 2016-04-19 NOTE — Progress Notes (Signed)
Patient has free air on plain film which was not present 3 days ago.  That along with his increasing abdominal pain likely represents an anastomotic leak and will take the patient back to surgery today  Kathryne Eriksson. Dahlia Bailiff, MD, [place] [phone] [facility]

## 2016-04-19 NOTE — Progress Notes (Signed)
[facility] Progress Note Subjective: Patient having a lot more abdominal pain this morning that yesterday.  He pulled his NGT out this morning also.  Seems very uncomfortable.  Objective: Vital signs in last 24 hours: Temp:  [97.5 F (36.4 C)-98.9 F (37.2 C)] 97.9 F (36.6 C) (02/27 0501) Pulse Rate:  [82-142] 82 (02/27 0219) Resp:  [10-26] 26 (02/27 0501) BP: (106-144)/(58-90) 143/72 (02/27 0501) SpO2:  [91 %-98 %] 95 % (02/27 0501)    Intake/Output from previous day: 02/26 0701 - 02/27 0700 In: 2424 [P.O.:60; I.V.:2364] Out: 2800 [Urine:1450; Emesis/NG output:1350] Intake/Output this shift: No intake/output data recorded.  Lungs: Clear  Abd: Soft but diffusely tender.Hypoactive bowel sounds and more distended than yesterday.  Extremities: No changed  Neuro: Intact  Lab Results: CBC   Recent Labs  04/18/16 0807 04/19/16 0420  WBC 11.1* 4.7  HGB 12.2* 12.7*  HCT 40.2 42.9  PLT 230 206   BMET  Recent Labs  04/18/16 0807 04/19/16 0420  NA 151* 148*  K 4.1 3.8  CL 105 103  CO2 37* 35*  GLUCOSE 101* 141*  BUN 25* 31*  CREATININE 1.92* 2.16*  CALCIUM 9.6 9.1   PT/INR No results for input(s): LABPROT, INR in the last 72 hours. ABG No results for input(s): PHART, HCO3 in the last 72 hours.  Invalid input(s): PCO2, PO2  Studies/Results: No results found.  Anti-infectives: Anti-infectives    Start     Dose/Rate Route Frequency Ordered Stop   04/12/16 2000  cefoTEtan (CEFOTAN) 2 g in dextrose 5 % 50 mL IVPB     2 g 100 mL/hr over 30 Minutes Intravenous Every 12 hours 04/12/16 1120 04/12/16 2125   04/12/16 0700  cefoTEtan (CEFOTAN) 2 g in dextrose 5 % 50 mL IVPB     2 g 100 mL/hr over 30 Minutes Intravenous To ShortStay Surgical 04/11/16 1404 04/12/16 0753   04/12/16 0548  cefoTEtan in Dextrose 5% (CEFOTAN) 2-2.08 GM-% IVPB    Comments:  Precious Haws   : cabinet override      04/12/16 0548 04/12/16 1759   04/12/16 0544  neomycin (MYCIFRADIN) tablet 1,000  mg  Status:  Discontinued     1,000 mg Oral 3 times per day 04/12/16 0544 04/12/16 0547   04/12/16 0544  metroNIDAZOLE (FLAGYL) tablet 1,000 mg  Status:  Discontinued     1,000 mg Oral 3 times per day 04/12/16 0544 04/12/16 0547      Assessment/Plan: s/p Procedure(s): COLOSTOMY REVERSAL Concerned that the patient may have a leak because of theincrease in abdominal pain.  Will Replace NGT after X-ray to l ook for pree air.  LOS: 7 days    Kathryne Eriksson. Dahlia Bailiff, MD, [place] [phone] [place] [facility] 04/19/2016.j

## 2016-04-19 NOTE — Progress Notes (Signed)
Pharmacy Antibiotic Note  Larry [name] is a 66 y.o. male admitted on 04/12/2016 for a planned colostomy reversal. The patient had to go back to the OR on 2/27 due to an anastomic leak.  Pharmacy has been consulted for Zosyn dosing.   The patient is noted to have AKI (baseline 1.5-2, current SCr 3.36 << 2.16), estimated normalized CrCl~20-25 ml/min. Will start with full dose Zosyn dosing however with low threshold to reduce dosing if AKI persists or worsens.  Plan: 1. Start Zosyn 3.375g IV every 8 hours (infused over 4 hours) 2. Will reduce the dose if AKI persists or worsens - will also monitor UOP 3. Will continue to follow renal function, culture results, LOT, and antibiotic de-escalation plans   Height: 5' 10.5" (179.1 cm) Weight: (!) 371 lb 7.6 oz (168.5 kg) IBW/kg (Calculated) : 74.15  Temp (24hrs), Avg:98.6 F (37 C), Min:97.5 F (36.4 C), Max:100.3 F (37.9 C)   Recent Labs Lab 04/16/16 1125 04/17/16 0530 04/18/16 0807 04/19/16 0420 04/19/16 1545  WBC 9.4 8.5 11.1* 4.7 11.1*  CREATININE 2.08* 1.80* 1.92* 2.16* 3.36*    Estimated Creatinine Clearance: 35.6 mL/min (by C-G formula based on SCr of 3.36 mg/dL (H)).    Allergies  Allergen Reactions  . No Known Allergies     Antimicrobials this admission: Zosyn 2/27 >>  Dose adjustments this admission: N/a  Microbiology results: n/a  Thank you for allowing pharmacy to be a part of this patient's care.  Alycia Rossetti, PharmD, BCPS Clinical Pharmacist Pager: [phone] 04/19/2016 7:58 PM

## 2016-04-19 NOTE — Progress Notes (Signed)
Called to Short Stay 34 to discontinue the lipids which were complete.   TNA still infusing.

## 2016-04-19 NOTE — Anesthesia Procedure Notes (Signed)
Central Venous Catheter Insertion Performed by: Marcie Bal Daaron Dimarco, anesthesiologist Start/End2/27/2018 1:41 PM, 04/19/2016 1:49 PM Patient location: OR. Preanesthetic checklist: patient identified, IV checked, site marked, risks and benefits discussed, surgical consent, monitors and equipment checked, pre-op evaluation, timeout performed and anesthesia consent Position: Trendelenburg Patient sedated Hand hygiene performed , maximum sterile barriers used  and Seldinger technique used Catheter size: 7 Fr Central line was placed.Double lumen Procedure performed using ultrasound guided technique. Ultrasound Notes:anatomy identified, needle tip was noted to be adjacent to the nerve/plexus identified and no ultrasound evidence of intravascular and/or intraneural injection Attempts: 1 Following insertion, line sutured, dressing applied and Biopatch. Post procedure assessment: blood return through all ports, free fluid flow and no air  Patient tolerated the procedure well with no immediate complications.

## 2016-04-19 NOTE — Progress Notes (Signed)
Progress Note  Patient Name: Larry [name] Date of Encounter: 04/19/2016  Primary Cardiologist: Dr Rayann Heman  Subjective   No cardiac complaints. I had to awaken him to exam. Not dyspneic.He pulled his NG tube last evening and preparing to have reinsertion.  Inpatient Medications    Scheduled Meds: . alvimopan  12 mg Oral BID  . aspirin  300 mg Rectal Daily  . atorvastatin  40 mg Oral q1800  . bethanechol  10 mg Oral TID  . enoxaparin (LOVENOX) injection  40 mg Subcutaneous Q24H  . ferrous sulfate  325 mg Oral Q breakfast  . HYDROmorphone   Intravenous Q4H  . insulin aspart  0-9 Units Subcutaneous Q6H  . metoprolol  10 mg Intravenous Q6H  . pantoprazole (PROTONIX) IV  40 mg Intravenous Q24H  . potassium chloride  10 mEq Intravenous Q1 Hr x 2  . simethicone  80 mg Oral QID  . sodium chloride  1,000 mL Intravenous Once  . tamsulosin  0.4 mg Oral QPC supper   Continuous Infusions: . 0.45 % NaCl with KCl 20 mEq / L 75 mL/hr at 04/19/16 0002  . [name] KitchenTPN (CLINIMIX-E) Adult     And  . fat emulsion    . TPN (CLINIMIX) Adult without lytes 40 mL/hr at 04/18/16 1724   PRN Meds: acetaminophen, diphenhydrAMINE **OR** diphenhydrAMINE, levalbuterol, metoprolol, naloxone **AND** sodium chloride flush, nitroGLYCERIN, ondansetron (ZOFRAN) IV, sodium chloride flush   Vital Signs    Vitals:   04/19/16 0219 04/19/16 0341 04/19/16 0501 04/19/16 0825  BP: (!) 106/58  (!) 143/72   Pulse: 82     Resp: (!) 21 (!) 22 (!) 26 (!) 27  Temp: 98 F (36.7 C)  97.9 F (36.6 C)   TempSrc: Oral  Oral   SpO2: 94% 98% 95% 94%  Weight:      Height:        Intake/Output Summary (Last 24 hours) at 04/19/16 1142 Last data filed at 04/19/16 [id number]  Gross per 24 hour  Intake             2424 ml  Output             2400 ml  Net               24 ml   Filed Weights   04/12/16 0556 04/15/16 0917 04/18/16 0800  Weight: (!) 377 lb (171 kg) (!) 371 lb 7.6 oz (168.5 kg) (!) 371 lb 7.6 oz (168.5 kg)     Telemetry    Sinus tach at 120 bpm. No ventricular ectopy. - Personally Reviewed  Physical Exam   GEN: No acute distress. Obese Neck: supple Cardiac: regular and tachycardic Respiratory: Mildly diminished BS bases GI: Distended; s/p abd surgery MS: trace edema; No deformity. Neuro:  Nonfocal  Psych: Normal affect   Labs    Chemistry  Recent Labs Lab 04/17/16 0530 04/18/16 0807 04/19/16 0420  NA 152* 151* 148*  K 4.0 4.1 3.8  CL 106 105 103  CO2 34* 37* 35*  GLUCOSE 92 101* 141*  BUN 26* 25* 31*  CREATININE 1.80* 1.92* 2.16*  CALCIUM 9.3 9.6 9.1  PROT  --   --  6.5  ALBUMIN  --   --  2.8*  AST  --   --  29  ALT  --   --  32  ALKPHOS  --   --  61  BILITOT  --   --  1.5*  GFRNONAA 38* 35*  31*  GFRAA 44* 41* 36*  ANIONGAP 12 9 10      Hematology  Recent Labs Lab 04/17/16 0530 04/18/16 0807 04/19/16 0420  WBC 8.5 11.1* 4.7  RBC 3.91* 4.38 4.63  HGB 10.7* 12.2* 12.7*  HCT 35.6* 40.2 42.9  MCV 91.0 91.8 92.7  MCH 27.4 27.9 27.4  MCHC 30.1 30.3 29.6*  RDW 15.5 15.3 15.7*  PLT 192 230 206    Radiology    Dg Abd Portable 1v  Result Date: 04/19/2016 CLINICAL DATA:  Abdominal pain. Reversal of colostomy on 04/12/2016. EXAM: PORTABLE ABDOMEN - 1 VIEW COMPARISON:  04/16/2016 and 04/15/2016 and 02/23/2013 FINDINGS: NG tube is been removed. Free air in the abdomen. Slightly dilated small bowel loops in the left mid abdomen persist the stomach is not distended. Colon is not distended. Surgical staples and soft tissue drain are noted. IMPRESSION: Persistent dilatation of small-bowel loops in the left mid abdomen. Moderate free air in the abdomen. Electronically Signed   By: Lorriane Shire M.D.   On: 04/19/2016 10:23    Patient Profile     66 year old male with past medical history of coronary artery disease, ischemic cardiomyopathy, atrial tachycardia and now status post abdominal surgery we were consulted for tachycardia. Patient has had prior PCI of his  right coronary artery in 2010 and LAD in 2016. Last cardiac catheterization November 2017 showed 75% mid LAD and a 90% apical LAD. There is a 50-60% origin of the PDA. Last echocardiogram January 2015 showed ejection fraction 40-45%, mild left ventricular hypertrophy, grade 1 diastolic dysfunction and biatrial enlargement. Nuclear study December 2017 showed ejection fraction 58%. There was diaphragmatic attenuation versus scar but no ischemia. Patient's history also significant for prior partial colectomy for adenocarcinoma in 2014. He developed atrial tachycardia during that admission and also suffered a cardiac arrest requiring defibrillation. His QT was prolonged felt secondary to hypokalemia and hypomagnesemia. Patient admitted on February 20 and underwent reversal of colostomy. Course complicated by postoperative ileus. Also noted to be tachycardic and cardiology asked to evaluate.   Assessment & Plan    1. Sinus tachycardia  Persistent but well tolerated hemodynamically.  2. CAD -patient has history of PCI with DES. Continue Aspirin  and Plavix on hold. Resume when taking PO.  3. A/C kidney disease -worsening function based upon today's labs  4. S/P abdominal surgery with  Ileus.   5. Hypertension  - will improve when can resume PO therapy.  6. History of TdP VT - this occurred postoperatively at time of colon cancer resection. No ventricular ectopy on tele.  Will continue to follow along and reinstitute therapy as possible.  Signed, Sinclair Grooms, MD  04/19/2016, 11:42 AM

## 2016-04-19 NOTE — Anesthesia Procedure Notes (Addendum)
Procedure Name: Intubation Date/Time: 04/19/2016 2:33 PM Performed by: Suzy Bouchard Pre-anesthesia Checklist: Emergency Drugs available, Patient identified, Suction available, Patient being monitored and Timeout performed Patient Re-evaluated:Patient Re-evaluated prior to inductionOxygen Delivery Method: Circle system utilized Preoxygenation: Pre-oxygenation with 100% oxygen Intubation Type: Inhalational induction with existing ETT Laryngoscope Size: Glidescope and 3 Grade View: Grade I Tube type: Subglottic suction tube Tube size: 7.5 mm Airway Equipment and Method: Stylet Lacinda Axon cathether for ETT exchange/ glidescope visualization) Placement Confirmation: ETT inserted through vocal cords under direct vision,  positive ETCO2 and breath sounds checked- equal and bilateral Secured at: 23 cm Tube secured with: Tape Dental Injury: Teeth and Oropharynx as per pre-operative assessment  Comments: ETT changed to subglottic suction tube at end of case.  Visualized with glidescope, exchanged ETT over cook catheter easily and with visualization.  +BBS +[id number].

## 2016-04-19 NOTE — Progress Notes (Signed)
PHARMACY - ADULT TOTAL PARENTERAL NUTRITION CONSULT NOTE   Pharmacy Consult:  TPN Indication:  Prolonged ileus  Patient Measurements: Height: 5' 10.5" (179.1 cm) Weight: (!) 371 lb 7.6 oz (168.5 kg) IBW/kg (Calculated) : 74.15 TPN AdjBW (KG): 97.7 Body mass index is 52.55 kg/m.   Assessment:  89 YOM with history of an obstructing tumor s/p emergent colectomy with colostomy of right transverse colon in 2014.  Patient presented on 04/12/16 for reversal of colostomy.  Postoperatively, he had emesis on 04/14/16 and a distended abdomen on 04/15/16.  He has consumed </= 10% of his meals for 7 days and Pharmacy consulted to manage TPN for prolonged ileus.  Patient reports eating regularly and gained 3-4 lbs before admission.  GI: baseline prealbumin WNL at 21 - Entereg, simethicone, PPI IV.  NG O/P 1371mL.  Increasing abd pain and MD concerned with leakage.  Pt pulled out NG tube, Xray to r/o free air. Endo: no hx DM, A1c 5.5% - CBGs controlled Insulin requirements in the past 24 hours: none (1 unit since TPN started) Lytes: Na improved to 148, K+ 3.8 (goal >/= 4 for ileus and hx torsades), others lytes trended down but WNL *days without lytes in TPN:  2/26 Renal: CKD3 - SCr up 2.16, BUN 31, CrCL 36 ml/min.  Flomax, bethanechol, 1/2NS 20K at 75 ml/hr - decent UOP 0.4 ml/kg/hr, net -16.7 since admit Pulm: OSA on CPAP QHS - remains on 3L  Cards: CM / CHF (EF 55-60%) / CAD post PCI / HLD / HTN / PAD / hx atrial tachycardia and torsades - new tachy started 2/23 likely d/t dehydration and malabsorption of meds.  BP/HR improving - IV metoprolol, ASA PR, Lipitor Hepatobil: LFTS WNL, tbili 1.5, TG mildly elevated at 189 Neuro: Dilaudid PCA - pain score 0-5 ID: afebrile, WBC normalized - not on abx Heme/Onc: hx colon cancer - mild anemia, plts WNL - PO iron Best Practices: Lovenox TPN Access: PICC placed 04/18/16 TPN start date: 04/18/16  Nutritional Goals: 2450-2600 kCal and 130-145 gm protein per  day  Current Nutrition:  TPN   Plan:  - Increase Clinimix E 5/15 to 80 ml/hr (goal rate ~120 ml/hr).  Add back electrolytes today.  Lipids 20 ml/hr over 12 hours. - Daily multivitamin in TPN  - Trace elements every other day d/t shortage (next 2/28) - Continue sensitive SSI Q6H.  D/C if CBGs remain controlled at goal TPN rate. - D/C 1/2NS 20K at 75 ml/hr when new TPN bag starts  (IVF provides ~67mEq per day)  - KCL x 2 runs - Watch Na+, CBGs - F/U AM labs (repeat TG on Thurs)   Adisyn Ruscitti D. Mina Marble, PharmD, BCPS Pager:  [phone] 04/19/2016, 10:41 AM

## 2016-04-19 NOTE — Progress Notes (Addendum)
eLink Physician-Brief Progress Note Patient Name: Larry [name] DOB: 09-30-1950 MRN: [id number]   Date of Service  04/19/2016  HPI/Events of Note  Discussed with Dr. Barry Dienes Pt had evidence of peritonitis on OR Will need management for septic shock  eICU Interventions  Switch neo to levophed Continue fluid resuscitation. Start zosyn Follow labs, CVP, Pct, cultures, cortisol Full consult note to follow.     Intervention Category Major Interventions: Sepsis - evaluation and management  Durga Saldarriaga 04/19/2016, 7:55 PM

## 2016-04-19 NOTE — Op Note (Addendum)
OPERATIVE REPORT  DATE OF OPERATION:  04/19/2016  PATIENT:  Larry [name]  67 y.o. male  PRE-OPERATIVE DIAGNOSIS:  FREE AIR AND PERITONITIS  POST-OPERATIVE DIAGNOSIS:  FREE AIR AND PERITONITIS  INDICATION(S) FOR OPERATION:  Peritonitis and free air after previous reversal of colostomy  FINDINGS:  Localized anastomotic leakage at the medial corne of the colocolostomy  PROCEDURE:  Procedure(s): EXPLORATORY LAPAROTOMY, REPAIR OF ANASTAMOTIC LEAK Loop Ileostomy  SURGEON:  Surgeon(s): Judeth Horn, MD Stark Klein, MD  ASSISTANT: Barry Dienes  ANESTHESIA:   general  COMPLICATIONS:  None  EBL: <50 ml  BLOOD ADMINISTERED: none  DRAINS: Nasogastric Tube, Urinary Catheter (Foley) and (19Fr) Blake drain(s) in the perianastomotic area   SPECIMEN:  No Specimen  COUNTS CORRECT:  YES  PROCEDURE DETAILS: The patient was taken to the operating room and placed on the table in supine position. After an adequate general endotracheal anesthetic was administered he was prepped and draped in usual sterile manner exposing his abdomen.  A proper timeout was performed identifying the patient and the procedure to be performed. The midline staples were removed and subsequently the fascial sutures cut allowing [place] to enter the peritoneal cavity.  Upon entering the peritoneum, we aspirated in all 4 quadrants of the peritoneal cavity only a small amount of stain peritoneal fluid/ascites was aspirated. Upon further inspection there was leakage of enteric contents from the anastomosis on the medial aspect. 5 sutures were placed in this area in order to close off the anastomosis with the plan of performing a diverting loop ileostomy in the right lower quadrant.  Once we had closed off the apparent area of leakage 19 [place] Blake drain was placed up above and proximal to the area of the anastomosis for further control. Thien we brought out the distal loop of ileum at an ostomy site made in right lower  quadrant.  We irrigated with multiple liters of warm saline solution. We then closed using running looped #1 PDS suture with intervening internal retention sutures of #1 Novafil placed and in figure-of-eight manner. Prior to closure the peritoneal cavity Seprafilm was placed in order to protect the bowel from the anterior abdominal wall.  Once the fascia was closed the loop ileostomy was matured using interrupted 3-0 Vicryl sutures. A bar was placed underneath the loop for the meantime then a colostomy appliance applied  All needle counts, sponge counts, and instrument counts were correct   PATIENT DISPOSITION:  ICU - intubated and hemodynamically stable.   Krystiana Fornes 2/27/20182:37 PM

## 2016-04-20 ENCOUNTER — Inpatient Hospital Stay (HOSPITAL_COMMUNITY): Payer: Medicare Other

## 2016-04-20 ENCOUNTER — Telehealth: Payer: Self-pay | Admitting: Nurse Practitioner

## 2016-04-20 ENCOUNTER — Encounter (HOSPITAL_COMMUNITY): Payer: Self-pay | Admitting: General Surgery

## 2016-04-20 DIAGNOSIS — J9601 Acute respiratory failure with hypoxia: Secondary | ICD-10-CM

## 2016-04-20 DIAGNOSIS — R748 Abnormal levels of other serum enzymes: Secondary | ICD-10-CM

## 2016-04-20 DIAGNOSIS — M79609 Pain in unspecified limb: Secondary | ICD-10-CM

## 2016-04-20 DIAGNOSIS — M7989 Other specified soft tissue disorders: Secondary | ICD-10-CM

## 2016-04-20 LAB — BRAIN NATRIURETIC PEPTIDE: B Natriuretic Peptide: 43.3 pg/mL (ref 0.0–100.0)

## 2016-04-20 LAB — POCT I-STAT 3, ART BLOOD GAS (G3+)
Acid-Base Excess: 2 mmol/L (ref 0.0–2.0)
BICARBONATE: 26.8 mmol/L (ref 20.0–28.0)
O2 Saturation: 98 %
Patient temperature: 100
TCO2: 28 mmol/L (ref 0–100)
pCO2 arterial: 43.1 mmHg (ref 32.0–48.0)
pH, Arterial: 7.405 (ref 7.350–7.450)
pO2, Arterial: 105 mmHg (ref 83.0–108.0)

## 2016-04-20 LAB — CBC WITH DIFFERENTIAL/PLATELET
BASOS ABS: 0 10*3/uL (ref 0.0–0.1)
BASOS PCT: 0 %
Eosinophils Absolute: 0 10*3/uL (ref 0.0–0.7)
Eosinophils Relative: 0 %
HCT: 33.9 % — ABNORMAL LOW (ref 39.0–52.0)
HEMOGLOBIN: 10.2 g/dL — AB (ref 13.0–17.0)
Lymphocytes Relative: 8 %
Lymphs Abs: 0.9 10*3/uL (ref 0.7–4.0)
MCH: 27.3 pg (ref 26.0–34.0)
MCHC: 30.1 g/dL (ref 30.0–36.0)
MCV: 90.9 fL (ref 78.0–100.0)
Monocytes Absolute: 0.8 10*3/uL (ref 0.1–1.0)
Monocytes Relative: 7 %
NEUTROS PCT: 85 %
Neutro Abs: 9.2 10*3/uL — ABNORMAL HIGH (ref 1.7–7.7)
Platelets: 176 10*3/uL (ref 150–400)
RBC: 3.73 MIL/uL — AB (ref 4.22–5.81)
RDW: 15.4 % (ref 11.5–15.5)
WBC: 10.9 10*3/uL — AB (ref 4.0–10.5)

## 2016-04-20 LAB — TROPONIN I
Troponin I: 0.05 ng/mL (ref <0.03)
Troponin I: 0.03 ng/mL (ref <0.03)
Troponin I: 0.04 ng/mL (ref <0.03)

## 2016-04-20 LAB — BASIC METABOLIC PANEL
ANION GAP: 6 (ref 5–15)
BUN: 39 mg/dL — ABNORMAL HIGH (ref 6–20)
CALCIUM: 7.7 mg/dL — AB (ref 8.9–10.3)
CO2: 28 mmol/L (ref 22–32)
Chloride: 109 mmol/L (ref 101–111)
Creatinine, Ser: 3.23 mg/dL — ABNORMAL HIGH (ref 0.61–1.24)
GFR, EST AFRICAN AMERICAN: 22 mL/min — AB (ref >60)
GFR, EST NON AFRICAN AMERICAN: 19 mL/min — AB (ref >60)
Glucose, Bld: 162 mg/dL — ABNORMAL HIGH (ref 65–99)
Potassium: 3.8 mmol/L (ref 3.5–5.1)
Sodium: 143 mmol/L (ref 135–145)

## 2016-04-20 LAB — URINALYSIS, ROUTINE W REFLEX MICROSCOPIC
Bilirubin Urine: NEGATIVE
Glucose, UA: NEGATIVE mg/dL
Ketones, ur: NEGATIVE mg/dL
Leukocytes, UA: NEGATIVE
Nitrite: NEGATIVE
PROTEIN: 30 mg/dL — AB
Specific Gravity, Urine: 1.02 (ref 1.005–1.030)
pH: 5 (ref 5.0–8.0)

## 2016-04-20 LAB — PROCALCITONIN: Procalcitonin: 12.91 ng/mL

## 2016-04-20 LAB — GLUCOSE, CAPILLARY
GLUCOSE-CAPILLARY: 122 mg/dL — AB (ref 65–99)
GLUCOSE-CAPILLARY: 133 mg/dL — AB (ref 65–99)
GLUCOSE-CAPILLARY: 155 mg/dL — AB (ref 65–99)
Glucose-Capillary: 134 mg/dL — ABNORMAL HIGH (ref 65–99)

## 2016-04-20 LAB — SURGICAL PCR SCREEN
MRSA, PCR: NEGATIVE
Staphylococcus aureus: NEGATIVE

## 2016-04-20 MED ORDER — SODIUM CHLORIDE 0.9 % IV SOLN
100.0000 mg | INTRAVENOUS | Status: AC
  Administered 2016-04-21 – 2016-05-02 (×12): 100 mg via INTRAVENOUS
  Filled 2016-04-20 (×12): qty 100

## 2016-04-20 MED ORDER — SODIUM CHLORIDE 0.9 % IV BOLUS (SEPSIS)
1000.0000 mL | Freq: Once | INTRAVENOUS | Status: AC
  Administered 2016-04-20: 1000 mL via INTRAVENOUS

## 2016-04-20 MED ORDER — TRACE MINERALS CR-CU-MN-SE-ZN 10-1000-500-60 MCG/ML IV SOLN
INTRAVENOUS | Status: AC
  Administered 2016-04-20: 18:00:00 via INTRAVENOUS
  Filled 2016-04-20: qty 1992

## 2016-04-20 MED ORDER — HYDROMORPHONE HCL 1 MG/ML IJ SOLN
2.0000 mg | INTRAMUSCULAR | Status: DC | PRN
  Administered 2016-04-20 – 2016-04-21 (×2): 2 mg via INTRAVENOUS
  Filled 2016-04-20 (×2): qty 2

## 2016-04-20 MED ORDER — ACETAMINOPHEN 650 MG RE SUPP
650.0000 mg | Freq: Four times a day (QID) | RECTAL | Status: DC | PRN
  Administered 2016-04-20 – 2016-04-22 (×4): 650 mg via RECTAL
  Filled 2016-04-20 (×4): qty 1

## 2016-04-20 MED ORDER — SODIUM CHLORIDE 0.9 % IV SOLN
30.0000 meq | Freq: Once | INTRAVENOUS | Status: AC
  Administered 2016-04-20: 30 meq via INTRAVENOUS
  Filled 2016-04-20: qty 15

## 2016-04-20 MED ORDER — SODIUM CHLORIDE 0.9 % IV SOLN
200.0000 mg | Freq: Once | INTRAVENOUS | Status: AC
  Administered 2016-04-20: 200 mg via INTRAVENOUS
  Filled 2016-04-20: qty 200

## 2016-04-20 MED ORDER — ALBUMIN HUMAN 5 % IV SOLN
25.0000 g | Freq: Once | INTRAVENOUS | Status: AC
  Administered 2016-04-20: 25 g via INTRAVENOUS
  Filled 2016-04-20: qty 250

## 2016-04-20 NOTE — Progress Notes (Signed)
[facility] Progress Note Patient Name: IKAIKA BHATIA DOB: June 15, 1950 MRN: [id number]   Date of Service  04/20/2016  HPI/Events of Note  HR remains in 130's - sinus tachycardia. CVP = 4.   eICU Interventions  Will order: 1. Bolus with 0.9 NaCl 1 liter IV over 1 hour now.      Intervention Category Major Interventions: Arrhythmia - evaluation and management  Frenchie Dangerfield Eugene 04/20/2016, 5:24 AM

## 2016-04-20 NOTE — Progress Notes (Signed)
[facility] Progress Note Patient Name: Larry [name] DOB: 08/15/1950 MRN: [id number]   Date of Service  04/20/2016  HPI/Events of Note  Fever to 101.4 F - Already pan cultured and on Zosyn for presumption of intra-abdominal source.   eICU Interventions  Will order: 1. Tylenol Suppository 650 mg PR Q 6 hours PRN Temp > 100.5 F.     Intervention Category Intermediate Interventions: Infection - evaluation and management  Sommer,Steven Eugene 04/20/2016, 12:23 AM

## 2016-04-20 NOTE — Telephone Encounter (Signed)
New Message   Pt wife states he was called by Andee Poles and wanted to let us know that he is in intensive care at [facility].

## 2016-04-20 NOTE — Telephone Encounter (Signed)
T/w pt's wife to say pt is in [place] and [place] prayers.

## 2016-04-20 NOTE — Significant Event (Signed)
Patient transitioned to a bariatric low air bed     Larry [name]

## 2016-04-20 NOTE — Progress Notes (Signed)
*  PRELIMINARY RESULTS* Vascular Ultrasound Bilateral lower extremity venous duplex has been completed.  Preliminary findings: No evidence of deep vein thrombosis in the visualized veins of the lower extremities. Negative for baker's cysts bilaterally.   Larry [name] 04/20/2016, 9:48 AM

## 2016-04-20 NOTE — Progress Notes (Addendum)
Progress Note  Patient Name: Larry [name] Date of Encounter: 04/20/2016  Primary Cardiologist: Dr Rayann Heman  Subjective   Intubated. Went to OR yesterday afternoon for anastomotic leak after finding free air on abdominal film.  Inpatient Medications    Scheduled Meds: . [START ON 04/21/2016] anidulafungin  100 mg Intravenous Q24H  . anidulafungin  200 mg Intravenous Once  . aspirin  300 mg Rectal Daily  . bethanechol  10 mg Per Tube TID  . chlorhexidine gluconate (MEDLINE KIT)  15 mL Mouth Rinse BID  . enoxaparin (LOVENOX) injection  40 mg Subcutaneous Q24H  . insulin aspart  0-9 Units Subcutaneous Q6H  . mouth rinse  15 mL Mouth Rinse QID  . metoprolol  10 mg Intravenous Q6H  . pantoprazole (PROTONIX) IV  40 mg Intravenous Q12H  . piperacillin-tazobactam (ZOSYN)  IV  3.375 g Intravenous Q8H  . potassium chloride (KCL MULTIRUN) 30 mEq in 265 mL IVPB  30 mEq Intravenous Once  . simethicone  80 mg Oral QID   Continuous Infusions: . [name] KitchenTPN (CLINIMIX-E) Adult    . sodium chloride 100 mL/hr at 04/20/16 0751  . fentaNYL infusion INTRAVENOUS 200 mcg/hr (04/20/16 0800)  . norepinephrine (LEVOPHED) Adult infusion 7 mcg/min (04/20/16 2119)  . propofol (DIPRIVAN) infusion 20 mcg/kg/min (04/20/16 0738)  . [name] KitchenTPN (CLINIMIX-E) Adult 80 mL/hr at 04/20/16 0700   PRN Meds: acetaminophen, fentaNYL, levalbuterol, metoprolol, midazolam, nitroGLYCERIN   Vital Signs    Vitals:   04/20/16 0800 04/20/16 0814 04/20/16 0852 04/20/16 0900  BP: (!) 95/57   92/73  Pulse:      Resp: 17   19  Temp:  98.7 F (37.1 C)    TempSrc:  Axillary    SpO2:   100%   Weight:      Height:        Intake/Output Summary (Last 24 hours) at 04/20/16 1023 Last data filed at 04/20/16 0900  Gross per 24 hour  Intake          8568.94 ml  Output             1225 ml  Net          7343.94 ml   Filed Weights   04/15/16 0917 04/18/16 0800 04/20/16 0624  Weight: (!) 371 lb 7.6 oz (168.5 kg) (!) 371 lb 7.6 oz  (168.5 kg) (!) 373 lb 7.4 oz (169.4 kg)    Telemetry    Sinus tachycardia without ventricular ectopy. - Personally Reviewed   ELECTROCARDIOGRAM Performed on 04/20/16: Sinus tachycardia old inferior infarct, low voltage, nonspecific ST abnormality. No acute change. Rate 137 bpm - personally reviewed.  Physical Exam  Intubated GEN:  Obese Neck: supple Cardiac: regular and tachycardic Respiratory: Mildly diminished BS bases GI: Distended; s/p abd surgery MS: trace edema; No deformity. Neuro:  Nonfocal  Psych: Normal affect   Labs    Chemistry  Recent Labs Lab 04/19/16 0420 04/19/16 1344 04/19/16 1545 04/20/16 0553  NA 148* 145 145 143  K 3.8 4.5 4.5 3.8  CL 103  --  105 109  CO2 35*  --  31 28  GLUCOSE 141*  --  136* 162*  BUN 31*  --  36* 39*  CREATININE 2.16*  --  3.36* 3.23*  CALCIUM 9.1  --  8.5* 7.7*  PROT 6.5  --   --   --   ALBUMIN 2.8*  --   --   --   AST 29  --   --   --  ALT 32  --   --   --   ALKPHOS 61  --   --   --   BILITOT 1.5*  --   --   --   GFRNONAA 31*  --  18* 19*  GFRAA 36*  --  21* 22*  ANIONGAP 10  --  9 6     Hematology  Recent Labs Lab 04/19/16 0420 04/19/16 1344 04/19/16 1545 04/20/16 0553  WBC 4.7  --  11.1* 10.9*  RBC 4.63  --  4.22 3.73*  HGB 12.7* 11.6* 11.6* 10.2*  HCT 42.9 34.0* 38.8* 33.9*  MCV 92.7  --  91.9 90.9  MCH 27.4  --  27.5 27.3  MCHC 29.6*  --  29.9* 30.1  RDW 15.7*  --  15.3 15.4  PLT 206  --  208 176    Radiology    Dg Chest Port 1 View  Result Date: 04/20/2016 CLINICAL DATA:  Respiratory failure, intubated patient. History of coronary angioplasty and stent placement. Status post colostomy reversal 8 days ago. EXAM: PORTABLE CHEST 1 VIEW COMPARISON:  Portable chest x-ray of April 19, 2016 FINDINGS: The lungs are reasonably well inflated. There is bibasilar atelectasis or pneumonia. The cardiac silhouette is enlarged. The pulmonary vascularity is engorged. There are probable small posterior layering  bilateral pleural effusions. The endotracheal tube tip lies 5.3 cm above the carina. The esophagogastric tube tip and proximal port project below the inferior margin of the image. The left internal jugular venous catheter tip projects over the midportion of the SVC. IMPRESSION: CHF with mild interstitial edema and bilateral pleural effusions. Persistent left lower lobe atelectasis or pneumonia. The support tubes are in reasonable position. Electronically Signed   By: David  Martinique M.D.   On: 04/20/2016 07:56   Dg Chest Port 1 View  Result Date: 04/19/2016 CLINICAL DATA:  Right IJ line placement EXAM: PORTABLE CHEST 1 VIEW COMPARISON:  04/07/2016 CXR FINDINGS: Heart is top normal. Patient is slightly rotated with chin obscuring the apices. Right IJ central line catheter is noted projecting toward the expected level of the proximal SVC. No pneumothorax. Small right and small to moderate left pleural effusions are new with adjacent atelectasis. Mild vascular congestion is noted. Endotracheal tube tip projects approximately 4.6 cm above the carina. Gastric tube extends below the left hemidiaphragm although the tip is not visualized on this study. IMPRESSION: Tip of right IJ catheter projects over the region of the proximal SVC. No pneumothorax. Satisfactory endotracheal tube tip position. Gastric tube extends below the left hemidiaphragm though tip is not included on this exam. Cardiomegaly with small right and small to moderate left pleural effusions. Electronically Signed   By: Ashley Royalty M.D.   On: 04/19/2016 19:08   Dg Abd Portable 1v  Result Date: 04/19/2016 CLINICAL DATA:  Abdominal pain. Reversal of colostomy on 04/12/2016. EXAM: PORTABLE ABDOMEN - 1 VIEW COMPARISON:  04/16/2016 and 04/15/2016 and 02/23/2013 FINDINGS: NG tube is been removed. Free air in the abdomen. Slightly dilated small bowel loops in the left mid abdomen persist the stomach is not distended. Colon is not distended. Surgical staples  and soft tissue drain are noted. IMPRESSION: Persistent dilatation of small-bowel loops in the left mid abdomen. Moderate free air in the abdomen. Electronically Signed   By: Lorriane Shire M.D.   On: 04/19/2016 10:23    Patient Profile     66 year old male with past medical history of coronary artery disease, ischemic cardiomyopathy, atrial tachycardia and now status  post abdominal surgery we were consulted for tachycardia. Patient has had prior PCI of his right coronary artery in 2010 and LAD in 2016. Last cardiac catheterization November 2017 showed 75% mid LAD and a 90% apical LAD. There is a 50-60% origin of the PDA. Last echocardiogram January 2015 showed ejection fraction 40-45%, mild left ventricular hypertrophy, grade 1 diastolic dysfunction and biatrial enlargement. Nuclear study December 2017 showed ejection fraction 58%. There was diaphragmatic attenuation versus scar but no ischemia. Patient's history also significant for prior partial colectomy for adenocarcinoma in 2014. He developed atrial tachycardia during that admission and also suffered a cardiac arrest requiring defibrillation. His QT was prolonged felt secondary to hypokalemia and hypomagnesemia. Patient admitted on February 20 and underwent reversal of colostomy. Course complicated by postoperative ileus. Also noted to be tachycardic and cardiology asked to evaluate. Repeat surgery for anastomotic leak on 04/19/16.  Assessment & Plan    1. Sinus tachycardia  Persistent . Agree with IV beta blocker therapy as you're doing.  2. CAD -Will check 12-lead EKG. Currently has mildly elevated troponins but likely related to demand of acute illness.  3. A/C kidney disease -worsening function based upon today's labs  4. S/P abdominal surgery now status post repair of anastomotic leak.  ECG, BNP, and troponin I will be obtained to ensure absence of silent cardiac event. I'm concerned by the patient's persistent sinus tachycardia, and  agree with IV metoprolol as ordered.  Signed, Sinclair Grooms, MD  04/20/2016, 10:23 AM

## 2016-04-20 NOTE — Progress Notes (Signed)
RT note- wean stopped HR 146

## 2016-04-20 NOTE — Telephone Encounter (Signed)
Please let her know that we got her message.

## 2016-04-20 NOTE — Progress Notes (Signed)
GS Progress Note Subjective: I appreciate the assistance of the critical care service.  The patient is now on Propofol sedation and BP is low with low CVP.  Urine output is marginal.  Objective: Vital signs in last 24 hours: Temp:  [99.5 F (37.5 C)-101.1 F (38.4 C)] 100 F (37.8 C) (02/28 0400) Pulse Rate:  [114-163] 149 (02/28 0700) Resp:  [7-31] 19 (02/28 0700) BP: (81-145)/(52-77) 101/59 (02/28 0700) SpO2:  [74 %-100 %] 100 % (02/28 0700) Arterial Line BP: (67-147)/(36-76) 103/49 (02/27 2030) FiO2 (%):  [50 %-60 %] 50 % (02/28 0400)    Intake/Output from previous day: 02/27 0701 - 02/28 0700 In: 4109 [I.V.:3559; NG/GT:50; IV Piggyback:500] Out: 930 [Urine:570; Emesis/NG output:200; Drains:110; Blood:50] Intake/Output this shift: No intake/output data recorded.  Lungs: Clear.  PaO2 100 on 60%.    Not acidotic  Abd: distended.  Not a lot of NGT output.  No ileostomy output.  Blake drain only with serosanguinous output.  Extremities: No eveidence of swelling  Neuro: Sedated  Lab Results: CBC   Recent Labs  04/19/16 1545 04/20/16 0553  WBC 11.1* 10.9*  HGB 11.6* 10.2*  HCT 38.8* 33.9*  PLT 208 176   BMET  Recent Labs  04/19/16 1545 04/20/16 0553  NA 145 143  K 4.5 3.8  CL 105 109  CO2 31 28  GLUCOSE 136* 162*  BUN 36* 39*  CREATININE 3.36* 3.23*  CALCIUM 8.5* 7.7*   PT/INR No results for input(s): LABPROT, INR in the last 72 hours. ABG  Recent Labs  04/19/16 1902 04/20/16 0411  PHART 7.395 7.405  HCO3 31.0* 26.8    Studies/Results: Dg Chest Port 1 View  Result Date: 04/19/2016 CLINICAL DATA:  Right IJ line placement EXAM: PORTABLE CHEST 1 VIEW COMPARISON:  04/07/2016 CXR FINDINGS: Heart is top normal. Patient is slightly rotated with chin obscuring the apices. Right IJ central line catheter is noted projecting toward the expected level of the proximal SVC. No pneumothorax. Small right and small to moderate left pleural effusions are new with  adjacent atelectasis. Mild vascular congestion is noted. Endotracheal tube tip projects approximately 4.6 cm above the carina. Gastric tube extends below the left hemidiaphragm although the tip is not visualized on this study. IMPRESSION: Tip of right IJ catheter projects over the region of the proximal SVC. No pneumothorax. Satisfactory endotracheal tube tip position. Gastric tube extends below the left hemidiaphragm though tip is not included on this exam. Cardiomegaly with small right and small to moderate left pleural effusions. Electronically Signed   By: Ashley Royalty M.D.   On: 04/19/2016 19:08   Dg Abd Portable 1v  Result Date: 04/19/2016 CLINICAL DATA:  Abdominal pain. Reversal of colostomy on 04/12/2016. EXAM: PORTABLE ABDOMEN - 1 VIEW COMPARISON:  04/16/2016 and 04/15/2016 and 02/23/2013 FINDINGS: NG tube is been removed. Free air in the abdomen. Slightly dilated small bowel loops in the left mid abdomen persist the stomach is not distended. Colon is not distended. Surgical staples and soft tissue drain are noted. IMPRESSION: Persistent dilatation of small-bowel loops in the left mid abdomen. Moderate free air in the abdomen. Electronically Signed   By: Lorriane Shire M.D.   On: 04/19/2016 10:23    Anti-infectives: Anti-infectives    Start     Dose/Rate Route Frequency Ordered Stop   04/19/16 2030  piperacillin-tazobactam (ZOSYN) IVPB 3.375 g     3.375 g 12.5 mL/hr over 240 Minutes Intravenous Every 8 hours 04/19/16 1958     04/19/16 1230  cefoTEtan in Dextrose 5% (CEFOTAN) 2-2.08 GM-% IVPB    Comments:  Ivar Drape   : cabinet override      04/19/16 1230 04/20/16 0044   04/12/16 2000  cefoTEtan (CEFOTAN) 2 g in dextrose 5 % 50 mL IVPB     2 g 100 mL/hr over 30 Minutes Intravenous Every 12 hours 04/12/16 1120 04/12/16 2125   04/12/16 0700  cefoTEtan (CEFOTAN) 2 g in dextrose 5 % 50 mL IVPB     2 g 100 mL/hr over 30 Minutes Intravenous To [facility] 04/11/16 1404 04/12/16 0753    04/12/16 0548  cefoTEtan in Dextrose 5% (CEFOTAN) 2-2.08 GM-% IVPB    Comments:  Precious Haws   : cabinet override      04/12/16 0548 04/12/16 1759   04/12/16 0544  neomycin (MYCIFRADIN) tablet 1,000 mg  Status:  Discontinued     1,000 mg Oral 3 times per day 04/12/16 0544 04/12/16 0547   04/12/16 0544  metroNIDAZOLE (FLAGYL) tablet 1,000 mg  Status:  Discontinued     1,000 mg Oral 3 times per day 04/12/16 0544 04/12/16 0547      Assessment/Plan: s/p Procedure(s): EXPLORATORY LAPAROTOMY, REPAIR OF ANASTAMOTIC LEAK COLOSTOMY Continue ABX therapy due to Post-op infection Continue foley due to strict I&O, patient critically ill and patient in ICU Needs volume and giving albumin.  CVP is low  Will not extubated today.  LOS: 8 days    Kathryne Eriksson. Dahlia Bailiff, MD, FACS [phone] [facility] Surgery 04/20/2016

## 2016-04-20 NOTE — Anesthesia Postprocedure Evaluation (Signed)
Anesthesia Post Note  Patient: Larry [name]  Procedure(s) Performed: Procedure(s) (LRB): EXPLORATORY LAPAROTOMY, REPAIR OF ANASTAMOTIC LEAK (N/A) COLOSTOMY (N/A)  Patient location during evaluation: SICU Anesthesia Type: General Level of consciousness: sedated Pain management: pain level controlled Vital Signs Assessment: post-procedure vital signs reviewed and stable Respiratory status: patient remains intubated per anesthesia plan Cardiovascular status: stable Anesthetic complications: no       Last Vitals:  Vitals:   04/20/16 0814 04/20/16 0900  BP:  92/73  Pulse:    Resp:  19  Temp: 37.1 C     Last Pain:  Vitals:   04/20/16 0814  TempSrc: Axillary  PainSc:                  [name]

## 2016-04-20 NOTE — Progress Notes (Signed)
[facility] Progress Note Patient Name: Larry [name] DOB: [birth year] MRN: [id number]   Date of Service  04/20/2016  HPI/Events of Note  Sinus Tachycardia - HR = 140. Already on Metoprolol. Spiked fever earlier.  LVEF = 55% to 60%.  eICU Interventions  Will order: 1. Bolus with 0.9 NaCl 1 liter IV over 1 hour now.      Intervention Category Major Interventions: Arrhythmia - evaluation and management  [name] 04/20/2016, 4:22 AM

## 2016-04-20 NOTE — Progress Notes (Addendum)
PHARMACY - ADULT TOTAL PARENTERAL NUTRITION CONSULT NOTE   Pharmacy Consult:  TPN Indication:  Prolonged ileus  Patient Measurements: Height: 5' 10.5" (179.1 cm) Weight: (!) 371 lb 7.6 oz (168.5 kg) IBW/kg (Calculated) : 74.15 TPN AdjBW (KG): 97.7 Body mass index is 52.55 kg/m.  Assessment:  54 YOM with history of an obstructing tumor s/p emergent colectomy with colostomy of right transverse colon in 2014.  Patient presented on 04/12/16 for reversal of colostomy.  Postoperatively, he had emesis on 04/14/16 and a distended abdomen on 04/15/16.  He has consumed </= 10% of his meals for 7 days and Pharmacy consulted to manage TPN for prolonged ileus.  Patient reports eating regularly and gained 3-4 lbs before admission.  GI: Morbidly obese. Increasing abd pain and MD concerned with leakage. Now s/p ex lap and repair of leak on 2/27. NG tube placed, output yesterday was 225ml. Albumin low at 2.8. Prealbumin wnl at 21. Entereg, simethicone, PPI IV. Endo: no hx DM, A1c 5.5% - CBGs controlled (120-150s) Insulin requirements in the past 24 hours: 2 units of SSI Lytes:wnl exc K 3.8 (goal > 4 for ileus) CoCa 8.5. Mg and Phos ok on 2/27. *days without lytes in TPN:  2/26 Renal: CKD3. SCr up to 3.23, BUN up to 39, CrCl ~66ml/min. UOP down yesterday (572ml) Flomax, bethanechol, NS @ 124ml/hr Pulm: Intubated on 2/27 Cards: CM / CHF (EF 55-60%) / CAD post PCI / HLD / HTN / PAD / hx atrial tachycardia and torsades. New tachy started 2/23 likely d/t dehydration and malabsorption of meds. Started back on levophed at 84mcg/min, IV metoprolol, ASA PR, Lipitor Hepatobil: LFTS wnl, tbili 1.5, TG down to 117. Neuro: Dilaudid PCA, propofol (20.10ml/hr) and fentanyl gtt (118mcg/hr) started on 2/27 ID: Had fevers overnight. Cx's drawn. Zosyn started. PCT elevated but trending down Heme/Onc: Hx colon cancer - mild anemia, plts WNL - PO iron Best Practices: Lovenox TPN Access: PICC placed 04/18/16 TPN start date:  04/18/16  Nutritional Goals: per RD recs on 2/27 KCal: 1850-2400 Protein: >/= 187 g  Current Nutrition:  Clinimix E 5/15 + IV lipid emulsions NPO   Plan:  Increase Clinimix E 5/15 slightly to 32ml/hr Hold IV lipid emulsions while on high dose propofol Continue propofol gtt at 20.10ml/hr (~480 kcal) per MD  NS at 100 ml/hr per MD This provides 100 g of protein and ~1900 kCals per day meeting 80% of protein and 76% of kCal needs Due to Clinimix shortage and patient meeting ~80% of nutritional needs, will not advance TPN further at the moment  Add MVI in TPN Add TE in TPN every other day, next 2/28 Continue sensitive SSI Q6h and adjust as needed Monitor TPN labs tomorrow, fluid volume and I/Os closely F/U improvement in ileus  Give KCl 72mEq IV x 1 run today  Elenor Quinones, PharmD, [facility] Clinical Pharmacist Pager [phone] 04/20/2016 7:52 AM

## 2016-04-20 NOTE — Progress Notes (Signed)
[facility] Attending Note  Presenting HPI:  66 y.o. 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 Presented on 2/20 for reversal of colostomy and developed postop ileus. Patient developed recurrent abdominal pain on 2/26 and taken back to the OR on 2/27 finding free air with peritonitis and anastomotic leakage. Patient returned from the operating room on ventilator and with ongoing shock.  Subjective:  No acute events overnight. Patient had ex lap yesterday and was found to have peritonitis. Returned from OR with ongoing shock.   Review of Systems:  Unable to obtain given intubation & sedation.   Vent Mode: PRVC FiO2 (%):  [40 %-60 %] 40 % Set Rate:  [14 bmp] 14 bmp Vt Set:  [580 mL] 580 mL PEEP:  [5 cmH20-8 cmH20] 8 cmH20 Pressure Support:  [5 cmH20] 5 cmH20 Plateau Pressure:  [18 cmH20-31 cmH20] 26 cmH20  Temp:  [98.7 F (37.1 C)-101.1 F (38.4 C)] 98.7 F (37.1 C) (02/28 0814) Pulse Rate:  [104-163] 149 (02/28 0700) Resp:  [7-31] 19 (02/28 0900) BP: (81-145)/(52-77) 92/73 (02/28 0900) SpO2:  [74 %-100 %] 100 % (02/28 0852) Arterial Line BP: (67-147)/(36-76) 103/49 (02/27 2030) FiO2 (%):  [40 %-60 %] 40 % (02/28 0854) Weight:  [373 lb 7.4 oz (169.4 kg)] 373 lb 7.4 oz (169.4 kg) (02/28 2952)  General:  Wife at bedside. Intubated. No distress. Integument:  Warm & dry. No rash on exposed skin. HEENT:  Moist mucus memebranes. No scleral icterus. Endotracheal tube in place. Neurological:  Pupils symmetric. Sedated. No spontaneous movements. Musculoskeletal:  No joint effusion or erythema appreciated. Symmetric muscle bulk. Pulmonary:  Symmetric chest wall rise on ventilator. Coarse breath sounds bilaterally. Cardiovascular:  Regular rate & rhythm. No appreciable JVD. Normal S1 & S2. Telemetry:  Sinus tachycardia. Abdomen:  Soft. Protuberant. Hypoactive bowel sounds.  LINES/TUBES: OETT 7.5 2/27  >>> R IJ CVL 2/27 >>> LUE DL PICC 2/26 >>> Foley >>> L NGT 2/27 >>> L RADIAL ART LINE 2/27 >>> PIV  CBC Latest Ref Rng & Units 04/20/2016 04/19/2016 04/19/2016  WBC 4.0 - 10.5 K/uL 10.9(H) 11.1(H) -  Hemoglobin 13.0 - 17.0 g/dL 10.2(L) 11.6(L) 11.6(L)  Hematocrit 39.0 - 52.0 % 33.9(L) 38.8(L) 34.0(L)  Platelets 150 - 400 K/uL 176 208 -   BMP Latest Ref Rng & Units 04/20/2016 04/19/2016 04/19/2016  Glucose 65 - 99 mg/dL 162(H) 136(H) -  BUN 6 - 20 mg/dL 39(H) 36(H) -  Creatinine 0.61 - 1.24 mg/dL 3.23(H) 3.36(H) -  Sodium 135 - 145 mmol/L 143 145 145  Potassium 3.5 - 5.1 mmol/L 3.8 4.5 4.5  Chloride 101 - 111 mmol/L 109 105 -  CO2 22 - 32 mmol/L 28 31 -  Calcium 8.9 - 10.3 mg/dL 7.7(L) 8.5(L) -   Hepatic Function Latest Ref Rng & Units 04/19/2016 04/07/2016 11/14/2015  Total Protein 6.5 - 8.1 g/dL 6.5 7.6 7.3  Albumin 3.5 - 5.0 g/dL 2.8(L) 3.9 3.5  AST 15 - 41 U/L 29 27 20   ALT 17 - 63 U/L 32 32 22  Alk Phosphatase 38 - 126 U/L 61 109 103  Total Bilirubin 0.3 - 1.2 mg/dL 1.5(H) 1.2 1.5(H)  Bilirubin, Direct <=0.2 mg/dL - - -    IMAGING/STUDIES: TTE 2/24: LV normal in size with moderate LVH. EF 55-60% with no regional wall motion abnormalities. Unable to assess diastolic function. LA poorly & RA poorly visualized. RV poorly visualized but cavity size appeared normal. No aortic stenosis  or regurgitation. Aortic root mildly dilated. No mitral stenosis or regurgitation. No pulmonic stenosis. Tricuspid valve poorly visualized but no regurgitation. No pericardial effusion. PORT CXR 2/28:  Personally reviewed by me. Endotracheal tube 5 cm above carina. Bilateral lower lobe opacities. Enteric feeding tube unchanged in position.  MICROBIOLOGY: Blood Cultures x2 2/27 >>> Tracheal Aspirate Culture 2/28 >>>  ANTIBIOTICS: Cefotan 2/20 (periop prophylaxis) Zosyn 2/27 >>>  SIGNIFICANT EVENTS: 02/20 - Admit for colostomy reversal 02/23 - Postop ileus & tachycardia 02/24 - Cardiology  Consulted 02/27 - OR for Exploratory Laparotomy finding anastomotic leak & peritonitis  ASSESSMENT/PLAN:  66 y.o. male with septic shock secondary to peritonitis from anastomotic leak post reversal of colostomy. Known history of torsades de pointe, hypertension, hyperlipidemia, chronic renal failure stage III, and coronary artery disease.  1. Severe sepsis with septic shock: Secondary to peritonitis. Trending Procalcitonin per algorithm. Cultures pending. Continuing Zosyn. Continue vasopressor support. Starting patient on Eraxis. 2. Acute on chronic hypoxic and hypercarbic respiratory failure: Known underlying OSA/OHS. Continuing full ventilator support. Daily spontaneous breathing trial & support wean. Holding on extubation pending further possible surgeries. 3. Acute on chronic renal failure stage III: Maintaining normotension. Trending urine output. Monitor electrolytes and renal function daily with labs. 4. Hyperglycemia:  Monitoring closely. May need to add/increase insulin in TPN. 5. History of colon cancer: Status post resection. 6. History of coronary artery disease: Continuing aspirin suppository. Maintaining normotension. Monitoring patient on telemetry. 7. History of essential hypertension: Currently hypotensive. Holding antihypertensive regimen. Monitoring vitals per unit protocol.  Prophylaxis:  Lovenox subcutaneous daily, Protonix IV every 12 hours, & SCDs. Diet:  Nothing by mouth. Continuing on TPN. Code Status:  Full code per previous physician discussions. Disposition:  Patient remains intubated  Family Update:  Wife updated at bedside during rounds today by me.   Remainder of care as per primary service & cardiology.   I have personally spent a total of 32 minutes of critical care time today caring for the patient, updating wife at bedside, & reviewing the aptient's electronic medical record.  Sonia Baller Ashok Cordia, M.D. [facility] Pager:   [phone] After 3pm or if no response, call [phone] 9:58 AM 04/20/16

## 2016-04-21 LAB — MAGNESIUM: Magnesium: 2.1 mg/dL (ref 1.7–2.4)

## 2016-04-21 LAB — CBC
HCT: 32.9 % — ABNORMAL LOW (ref 39.0–52.0)
HEMOGLOBIN: 9.6 g/dL — AB (ref 13.0–17.0)
MCH: 26.7 pg (ref 26.0–34.0)
MCHC: 29.2 g/dL — ABNORMAL LOW (ref 30.0–36.0)
MCV: 91.6 fL (ref 78.0–100.0)
Platelets: 170 10*3/uL (ref 150–400)
RBC: 3.59 MIL/uL — AB (ref 4.22–5.81)
RDW: 15.3 % (ref 11.5–15.5)
WBC: 10.4 10*3/uL (ref 4.0–10.5)

## 2016-04-21 LAB — COMPREHENSIVE METABOLIC PANEL
ALBUMIN: 2 g/dL — AB (ref 3.5–5.0)
ALK PHOS: 50 U/L (ref 38–126)
ALT: 21 U/L (ref 17–63)
AST: 17 U/L (ref 15–41)
Anion gap: 6 (ref 5–15)
BILIRUBIN TOTAL: 1 mg/dL (ref 0.3–1.2)
BUN: 32 mg/dL — AB (ref 6–20)
CALCIUM: 8.1 mg/dL — AB (ref 8.9–10.3)
CO2: 27 mmol/L (ref 22–32)
Chloride: 110 mmol/L (ref 101–111)
Creatinine, Ser: 2.51 mg/dL — ABNORMAL HIGH (ref 0.61–1.24)
GFR calc Af Amer: 30 mL/min — ABNORMAL LOW (ref >60)
GFR, EST NON AFRICAN AMERICAN: 26 mL/min — AB (ref >60)
GLUCOSE: 145 mg/dL — AB (ref 65–99)
Potassium: 4 mmol/L (ref 3.5–5.1)
Sodium: 143 mmol/L (ref 135–145)
TOTAL PROTEIN: 4.9 g/dL — AB (ref 6.5–8.1)

## 2016-04-21 LAB — GLUCOSE, CAPILLARY
GLUCOSE-CAPILLARY: 130 mg/dL — AB (ref 65–99)
GLUCOSE-CAPILLARY: 125 mg/dL — AB (ref 65–99)
Glucose-Capillary: 132 mg/dL — ABNORMAL HIGH (ref 65–99)
Glucose-Capillary: 219 mg/dL — ABNORMAL HIGH (ref 65–99)

## 2016-04-21 LAB — PROCALCITONIN: Procalcitonin: 6.25 ng/mL

## 2016-04-21 LAB — TROPONIN I
Troponin I: <0.03 ng/mL (ref <0.03)
Troponin I: <0.03 ng/mL (ref <0.03)
Troponin I: <0.03 ng/mL (ref <0.03)
Troponin I: <0.03 ng/mL (ref <0.03)
Troponin I: 0.03 ng/mL (ref <0.03)

## 2016-04-21 MED ORDER — SODIUM CHLORIDE 0.9% FLUSH: 10.0000 mL | INTRAVENOUS | Status: DC | PRN

## 2016-04-21 MED ORDER — DEXMEDETOMIDINE HCL IN NACL 400 MCG/100ML IV SOLN
0.4000 ug/kg/h | INTRAVENOUS | Status: DC
  Administered 2016-04-21 (×2): 1.2 ug/kg/h via INTRAVENOUS
  Administered 2016-04-21: 1 ug/kg/h via INTRAVENOUS
  Administered 2016-04-21 – 2016-04-22 (×4): 1.2 ug/kg/h via INTRAVENOUS
  Administered 2016-04-22: 1 ug/kg/h via INTRAVENOUS
  Administered 2016-04-22: 1.2 ug/kg/h via INTRAVENOUS
  Administered 2016-04-22: 1 ug/kg/h via INTRAVENOUS
  Administered 2016-04-22 (×4): 1.2 ug/kg/h via INTRAVENOUS
  Administered 2016-04-22: 1 ug/kg/h via INTRAVENOUS
  Administered 2016-04-23: 1.2 ug/kg/h via INTRAVENOUS
  Administered 2016-04-23: 1.1 ug/kg/h via INTRAVENOUS
  Administered 2016-04-23: 1 ug/kg/h via INTRAVENOUS
  Administered 2016-04-23 (×6): 1.2 ug/kg/h via INTRAVENOUS
  Administered 2016-04-23: 1.1 ug/kg/h via INTRAVENOUS
  Administered 2016-04-23: 1 ug/kg/h via INTRAVENOUS
  Administered 2016-04-24 – 2016-04-26 (×25): 1.2 ug/kg/h via INTRAVENOUS
  Administered 2016-04-26: 1 ug/kg/h via INTRAVENOUS
  Administered 2016-04-26: 1.2 ug/kg/h via INTRAVENOUS
  Administered 2016-04-26 (×4): 1 ug/kg/h via INTRAVENOUS
  Administered 2016-04-26 – 2016-04-27 (×2): 1.2 ug/kg/h via INTRAVENOUS
  Administered 2016-04-27 (×3): 1 ug/kg/h via INTRAVENOUS
  Administered 2016-04-27 – 2016-04-28 (×20): 1.2 ug/kg/h via INTRAVENOUS
  Administered 2016-04-29: 1.7 ug/kg/h via INTRAVENOUS
  Administered 2016-04-29: 1.2 ug/kg/h via INTRAVENOUS
  Administered 2016-04-29 (×4): 1.7 ug/kg/h via INTRAVENOUS
  Filled 2016-04-21 (×72): qty 100

## 2016-04-21 MED ORDER — SODIUM CHLORIDE 0.9% FLUSH
10.0000 mL | Freq: Two times a day (BID) | INTRAVENOUS | Status: DC
  Administered 2016-04-21 – 2016-05-01 (×14): 10 mL
  Administered 2016-05-01: 30 mL
  Administered 2016-05-02 – 2016-05-04 (×4): 10 mL

## 2016-04-21 MED ORDER — CHLORHEXIDINE GLUCONATE CLOTH 2 % EX PADS
6.0000 | MEDICATED_PAD | Freq: Every day | CUTANEOUS | Status: DC
  Administered 2016-04-21 – 2016-04-29 (×10): 6 via TOPICAL

## 2016-04-21 MED ORDER — M.V.I. ADULT IV INJ
INTRAVENOUS | Status: AC
  Administered 2016-04-21: 17:00:00 via INTRAVENOUS
  Filled 2016-04-21: qty 1992

## 2016-04-21 MED ORDER — DEXMEDETOMIDINE HCL IN NACL 200 MCG/50ML IV SOLN
0.4000 ug/kg/h | INTRAVENOUS | Status: DC
  Administered 2016-04-21: 1.2 ug/kg/h via INTRAVENOUS
  Filled 2016-04-21: qty 50

## 2016-04-21 NOTE — Progress Notes (Signed)
Progress Note  Patient Name: Larry [name] Date of Encounter: 04/21/2016  Primary Cardiologist: Dr Rayann Heman  Subjective   Remains intubated. Spoke to wife.  Inpatient Medications    Scheduled Meds: . anidulafungin  100 mg Intravenous Q24H  . aspirin  300 mg Rectal Daily  . bethanechol  10 mg Per Tube TID  . chlorhexidine gluconate (MEDLINE KIT)  15 mL Mouth Rinse BID  . Chlorhexidine Gluconate Cloth  6 each Topical Daily  . enoxaparin (LOVENOX) injection  40 mg Subcutaneous Q24H  . insulin aspart  0-9 Units Subcutaneous Q6H  . mouth rinse  15 mL Mouth Rinse QID  . metoprolol  10 mg Intravenous Q6H  . pantoprazole (PROTONIX) IV  40 mg Intravenous Q12H  . piperacillin-tazobactam (ZOSYN)  IV  3.375 g Intravenous Q8H  . simethicone  80 mg Oral QID  . sodium chloride flush  10-40 mL Intracatheter Q12H   Continuous Infusions: . [name] KitchenTPN (CLINIMIX-E) Adult 83 mL/hr at 04/21/16 0800  . sodium chloride 100 mL/hr at 04/21/16 0800  . dexmedetomidine    . fentaNYL infusion INTRAVENOUS 350 mcg/hr (04/21/16 0800)  . norepinephrine (LEVOPHED) Adult infusion Stopped (04/21/16 0425)  . propofol (DIPRIVAN) infusion 35 mcg/kg/min (04/21/16 0805)   PRN Meds: acetaminophen, fentaNYL, HYDROmorphone (DILAUDID) injection, levalbuterol, metoprolol, midazolam, nitroGLYCERIN, sodium chloride flush   Vital Signs    Vitals:   04/21/16 0600 04/21/16 0630 04/21/16 0700 04/21/16 0824  BP: 138/73 109/62 (!) 105/59   Pulse: (!) 127 (!) 108 (!) 109   Resp: 13 (!) 9 16   Temp:    (!) 101.5 F (38.6 C)  TempSrc:    Axillary  SpO2: 95% 93% 96%   Weight:      Height:        Intake/Output Summary (Last 24 hours) at 04/21/16 1044 Last data filed at 04/21/16 0800  Gross per 24 hour  Intake          5932.86 ml  Output             2758 ml  Net          3174.86 ml   Filed Weights   04/18/16 0800 04/20/16 0624 04/21/16 0500  Weight: (!) 371 lb 7.6 oz (168.5 kg) (!) 373 lb 7.4 oz (169.4 kg) (!) 389  lb (176.4 kg)    Telemetry    Sinus tachycardia without ventricular ectopy. - Personally Reviewed   ELECTROCARDIOGRAM Performed on 04/20/16: Sinus tachycardia old inferior infarct, low voltage, nonspecific ST abnormality. No acute change. Rate 137 bpm - personally reviewed.  Physical Exam  Intubated GEN:  Obese Neck: supple Cardiac: regular and tachycardic Respiratory: Mildly diminished BS bases GI: Distended; s/p abd surgery MS: trace edema; No deformity. Neuro:  Nonfocal  Psych: Normal affect   Labs    Chemistry  Recent Labs Lab 04/19/16 0420  04/19/16 1545 04/20/16 0553 04/21/16 0345  NA 148*  < > 145 143 143  K 3.8  < > 4.5 3.8 4.0  CL 103  --  105 109 110  CO2 35*  --  _0 GLUCOSE 141*  --  136* 162* 145*  BUN 31*  --  36* 39* 32*  CREATININE 2.16*  --  3.36* 3.23* 2.51*  CALCIUM 9.1  --  8.5* 7.7* 8.1*  PROT 6.5  --   --   --  4.9*  ALBUMIN 2.8*  --   --   --  2.0*  AST 29  --   --   --  17  ALT 32  --   --   --  21  ALKPHOS 61  --   --   --  50  BILITOT 1.5*  --   --   --  1.0  GFRNONAA 31*  --  18* 19* 26*  GFRAA 36*  --  21* 22* 30*  ANIONGAP 10  --  _0 < > = values in this interval not displayed.   Hematology  Recent Labs Lab 04/19/16 1545 04/20/16 0553 04/21/16 0345  WBC 11.1* 10.9* 10.4  RBC 4.22 3.73* 3.59*  HGB 11.6* 10.2* 9.6*  HCT 38.8* 33.9* 32.9*  MCV 91.9 90.9 91.6  MCH 27.5 27.3 26.7  MCHC 29.9* 30.1 29.2*  RDW 15.3 15.4 15.3  PLT 208 176 170    Radiology    Dg Chest Port 1 View  Result Date: 04/20/2016 CLINICAL DATA:  Respiratory failure, intubated patient. History of coronary angioplasty and stent placement. Status post colostomy reversal 8 days ago. EXAM: PORTABLE CHEST 1 VIEW COMPARISON:  Portable chest x-ray of April 19, 2016 FINDINGS: The lungs are reasonably well inflated. There is bibasilar atelectasis or pneumonia. The cardiac silhouette is enlarged. The pulmonary vascularity is engorged. There are  probable small posterior layering bilateral pleural effusions. The endotracheal tube tip lies 5.3 cm above the carina. The esophagogastric tube tip and proximal port project below the inferior margin of the image. The left internal jugular venous catheter tip projects over the midportion of the SVC. IMPRESSION: CHF with mild interstitial edema and bilateral pleural effusions. Persistent left lower lobe atelectasis or pneumonia. The support tubes are in reasonable position. Electronically Signed   By: David  Martinique M.D.   On: 04/20/2016 07:56   Dg Chest Port 1 View  Result Date: 04/19/2016 CLINICAL DATA:  Right IJ line placement EXAM: PORTABLE CHEST 1 VIEW COMPARISON:  04/07/2016 CXR FINDINGS: Heart is top normal. Patient is slightly rotated with chin obscuring the apices. Right IJ central line catheter is noted projecting toward the expected level of the proximal SVC. No pneumothorax. Small right and small to moderate left pleural effusions are new with adjacent atelectasis. Mild vascular congestion is noted. Endotracheal tube tip projects approximately 4.6 cm above the carina. Gastric tube extends below the left hemidiaphragm although the tip is not visualized on this study. IMPRESSION: Tip of right IJ catheter projects over the region of the proximal SVC. No pneumothorax. Satisfactory endotracheal tube tip position. Gastric tube extends below the left hemidiaphragm though tip is not included on this exam. Cardiomegaly with small right and small to moderate left pleural effusions. Electronically Signed   By: Ashley Royalty M.D.   On: 04/19/2016 19:08    Patient Profile     66 year old male with past medical history of coronary artery disease, ischemic cardiomyopathy, atrial tachycardia and now status post abdominal surgery we were consulted for tachycardia. Patient has had prior PCI of his right coronary artery in 2010 and LAD in 2016. Last cardiac catheterization November 2017 showed 75% mid LAD and a 90%  apical LAD. There is a 50-60% origin of the PDA. Last echocardiogram January 2015 showed ejection fraction 40-45%, mild left ventricular hypertrophy, grade 1 diastolic dysfunction and biatrial enlargement. Nuclear study December 2017 showed ejection fraction 58%. There was diaphragmatic attenuation versus scar but no ischemia. Patient's history also significant for prior partial colectomy for adenocarcinoma in 2014. He developed atrial tachycardia during that admission and also suffered a cardiac arrest requiring defibrillation. His QT was  prolonged felt secondary to hypokalemia and hypomagnesemia. Patient admitted on February 20 and underwent reversal of colostomy. Course complicated by postoperative ileus. Also noted to be tachycardic and cardiology asked to evaluate. Repeat surgery for anastomotic leak on 04/19/16.  Assessment & Plan    1. Sinus tachycardia  Persistent .   2. CAD - Markers and EKG following recent OR Tripp are unremarkable. No silent event  3. A/C kidney disease -improving  4. S/P abdominal surgery now status post repair of anastomotic leak.   We will follow and advise as needed. No active cardiac issues at this time. Watch for development of volume overload.  Signed, Sinclair Grooms, MD  04/21/2016, 10:44 AM

## 2016-04-21 NOTE — Progress Notes (Signed)
eLink Physician-Brief Progress Note Patient Name: Larry [name] DOB: 06-24-1950 MRN: [id number]   Date of Service  04/21/2016  HPI/Events of Note  Troponin mildly elevated Renal failure  eICU Interventions  Monitor hemodynamics/tele     Intervention Category Intermediate Interventions: Diagnostic test evaluation  Simonne Maffucci 04/21/2016, 10:53 PM

## 2016-04-21 NOTE — Consult Note (Addendum)
[facility] Nurse ostomy consult note Surgical team is following for assessment and plan of care to abd wound. Pt is familiar with ostomy care, since he previously had a colostomy, according to the EMR Stoma type/location: Ileostomy stoma red and viable, slightly above skin level, 1 1/2 inches Stomal assessment/size: Rod removed as requested  Peristomal assessment: Intact skin surrounding Output: No stool or flatus Ostomy pouching: 1pc.  Education provided:  Pt is intubated and sedated and no family members are present.  Will plan to begin teaching sessions when stable and out of ICU. Supplies ordered to the bedside for staff nurse use. Enrolled patient in [place] program: No Julien Girt MSN, [place], [place], [place], [place]

## 2016-04-21 NOTE — Progress Notes (Signed)
PHARMACY - ADULT TOTAL PARENTERAL NUTRITION CONSULT NOTE   Pharmacy Consult:  TPN Indication:  Prolonged ileus  Patient Measurements: Height: 5' 10.5" (179.1 cm) Weight: (!) 389 lb (176.4 kg) IBW/kg (Calculated) : 74.15 TPN AdjBW (KG): 99.7 Body mass index is 55.03 kg/m.  Assessment:  66 YOM with history of an obstructing tumor s/p emergent colectomy with colostomy of right transverse colon in 2014.  Patient presented on 04/12/16 for reversal of colostomy.  Postoperatively, he had emesis on 04/14/16 and a distended abdomen on 04/15/16.  He has consumed </= 10% of his meals for 7 days and Pharmacy consulted to manage TPN for prolonged ileus.  Patient reports eating regularly and gained 3-4 lbs before admission.   GI: Morbidly obese. Increasing abd pain and MD concerned with leakage. Now s/p ex lap and repair of leak on 2/27. NG tube placed, output yesterday was 463ml. Last BM 2/28. Albumin down to 2.0. Prealbumin wnl at 21. Simethicone, PPI IV. Endo: No hx DM, A1c 5.5% - CBGs controlled (120-140s) Insulin requirements in the past 24 hours: 4 units of SSI Lytes:wnl exc K up to 4 after replacement (goal > 4 for ileus) CoCa 9.6. Mg good at 2.1 (goal > 2 for ileus) and Phos ok on 2/27. *days without lytes in TPN:  2/26 Renal: CKD3. SCr down to 2.51 (peaked at 3.36), BUN back down 32, CrCl ~77ml/min. UOP back up and ok (0.55ml/kg/hr) Flomax, bethanechol, NS @ 100 ml/hr. Net positive ~5 L each of the past 2 days. Discussed with surgery and will probably back off soon since off pressors. Pulm: Intubated on 2/27. Hopeful for extubation on 3/1. Cards: CM / CHF (EF 55-60%) / CAD post PCI / HLD / HTN / PAD / hx atrial tachycardia and torsades. New tachy started 2/23 likely d/t dehydration and malabsorption of meds. Levophed gtt off this am, IV metoprolol, ASA PR Hepatobil: LFTS wnl, tbili 1.0, TG down to 117. Neuro: Propofol (35.11ml/hr) and fentanyl gtt (386mcg/hr) started on 2/27 ID: Had fevers overnight.  Cx's drawn. Zosyn and Eraxis started. PCT elevated but trending down Heme/Onc: Hx colon cancer - mild anemia, plts wnl Best Practices: Lovenox TPN Access: PICC placed 04/18/16 TPN start date: 04/18/16  Nutritional Goals: per RD recs on 2/27 KCal: 1850-2400 Protein: >/= 187 g  Current Nutrition:  Clinimix E 5/15 + IV lipid emulsions NPO  Plan:  Continue Clinimix E 5/15 at 42ml/hr Hold IV lipid emulsions while on high dose propofol Continue propofol gtt at 35.42ml/hr (~930 kcal) per MD  NS at 100 ml/hr per MD TPN + propofol provides 100 g of protein and 2344 kCals per day meeting 53% of protein and 100% of kCal needs Due to Clinimix shortage and patient may be extubated today, will not increase TPN  Add MVI in TPN Add TE in TPN every other day, next 3/2 Continue sensitive SSI Q6h and adjust as needed Monitor TPN labs, Phos tomorrow, fluid volume and I/Os closely F/U improvement in ileus  Elenor Quinones, PharmD, BCPS Clinical Pharmacist Pager [phone] 04/21/2016 9:52 AM

## 2016-04-21 NOTE — Progress Notes (Signed)
[facility] Attending Note  Presenting HPI:  66 y.o. 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 Presented on 2/20 for reversal of colostomy and developed postop ileus. Patient developed recurrent abdominal pain on 2/26 and taken back to the OR on 2/27 finding free air with peritonitis and anastomotic leakage. Patient returned from the operating room on ventilator and with ongoing shock.  Subjective:  No acute events overnight. Weaned off vasopressor overnight. Sedation weaning this morning and patient significantly hypertensive with increased heart rate and continued altered mentation despite withholding propofol.  Review of Systems:  Unable to obtain due to intubation and sedation.  Vent Mode: PRVC FiO2 (%):  [40 %-50 %] 40 % Set Rate:  [14 bmp] 14 bmp Vt Set:  [580 mL] 580 mL PEEP:  [5 cmH20-8 cmH20] 8 cmH20 Pressure Support:  [5 cmH20] 5 cmH20 Plateau Pressure:  [13 cmH20-17 cmH20] 13 cmH20  Temp:  [98.7 F (37.1 C)-100.2 F (37.9 C)] 99.8 F (37.7 C) (03/01 0400) Pulse Rate:  [108-144] 109 (03/01 0700) Resp:  [9-28] 16 (03/01 0700) BP: (92-138)/(57-75) 105/59 (03/01 0700) SpO2:  [93 %-100 %] 96 % (03/01 0700) Arterial Line BP: (65-147)/(39-63) 100/39 (03/01 0700) FiO2 (%):  [40 %-50 %] 40 % (03/01 0444) Weight:  [389 lb (176.4 kg)] 389 lb (176.4 kg) (03/01 0500)  Gen.: No family at bedside. Morbidly obese. Obviously distress. Integument: Warm and dry. No rash on exposed skin. Abdominal dressing over incision clean and dry. Cardiovascular: Tachycardic with sinus rhythm on telemetry. No appreciable JVD. Abdomen: Soft. Protuberant. Hypoactive bowel sounds persist. Pulmonary: Distant breath sounds. Symmetric chest wall rise on ventilator. Neurological: Eyes open. Patient does not track to voice. Spontaneously moving all 4 extremities but not following commands. Pupils symmetric and  equal.  LINES/TUBES: OETT 7.5 2/27 >>> R IJ CVL 2/27 >>> LUE DL PICC 2/26 >>> Foley >>> L NGT 2/27 >>> L RADIAL ART LINE 2/27 >>> PIV  CBC Latest Ref Rng & Units 04/21/2016 04/20/2016 04/19/2016  WBC 4.0 - 10.5 K/uL 10.4 10.9(H) 11.1(H)  Hemoglobin 13.0 - 17.0 g/dL 9.6(L) 10.2(L) 11.6(L)  Hematocrit 39.0 - 52.0 % 32.9(L) 33.9(L) 38.8(L)  Platelets 150 - 400 K/uL 170 176 208   BMP Latest Ref Rng & Units 04/21/2016 04/20/2016 04/19/2016  Glucose 65 - 99 mg/dL 145(H) 162(H) 136(H)  BUN 6 - 20 mg/dL 32(H) 39(H) 36(H)  Creatinine 0.61 - 1.24 mg/dL 2.51(H) 3.23(H) 3.36(H)  Sodium 135 - 145 mmol/L 143 143 145  Potassium 3.5 - 5.1 mmol/L 4.0 3.8 4.5  Chloride 101 - 111 mmol/L 110 109 105  CO2 22 - 32 mmol/L 27 28 31   Calcium 8.9 - 10.3 mg/dL 8.1(L) 7.7(L) 8.5(L)   Hepatic Function Latest Ref Rng & Units 04/21/2016 04/19/2016 04/07/2016  Total Protein 6.5 - 8.1 g/dL 4.9(L) 6.5 7.6  Albumin 3.5 - 5.0 g/dL 2.0(L) 2.8(L) 3.9  AST 15 - 41 U/L 17 29 27   ALT 17 - 63 U/L 21 32 32  Alk Phosphatase 38 - 126 U/L 50 61 109  Total Bilirubin 0.3 - 1.2 mg/dL 1.0 1.5(H) 1.2  Bilirubin, Direct <=0.2 mg/dL - - -    IMAGING/STUDIES: TTE 2/24: LV normal in size with moderate LVH. EF 55-60% with no regional wall motion abnormalities. Unable to assess diastolic function. LA poorly & RA poorly visualized. RV poorly visualized but cavity size appeared normal. No aortic stenosis or regurgitation. Aortic root mildly dilated. No mitral stenosis or  regurgitation. No pulmonic stenosis. Tricuspid valve poorly visualized but no regurgitation. No pericardial effusion. PORT CXR 2/28:  Previously reviewed by me. Endotracheal tube 5 cm above carina. Bilateral lower lobe opacities. Enteric feeding tube unchanged in position.  MICROBIOLOGY: Blood Cultures x2 2/27 >>> Tracheal Aspirate Culture 2/28 >>> MRSA PCR 2/28:  Negative   ANTIBIOTICS: Cefotan 2/20 (periop prophylaxis) Zosyn 2/27 >>> Eraxis 2/28 >>>  SIGNIFICANT  EVENTS: 02/20 - Admit for colostomy reversal 02/23 - Postop ileus & tachycardia 02/24 - Cardiology Consulted 02/27 - OR for Exploratory Laparotomy finding anastomotic leak & peritonitis  ASSESSMENT/PLAN:  66 y.o. male with septic shock secondary to peritonitis from bowel anastomotic leak. Has ongoing delirium and agitation this morning. Shock has resolved. Attempting to transition to alternative anxiolytic to minimize delirium and improve compliance with spontaneous breathing trials.   1. Severe sepsis with septic shock: Shock has resolved. Secondary to peritonitis. Trending Procalcitonin per algorithm. Cultures pending. Continuing empiric Zosyn & Eraxis.  2. Acute on chronic hypoxic and hypercarbic respiratory failure: Known underlying OSA/OHS. Continuing full ventilator support. Plan to repeat attempt spontaneous breathing trial this afternoon if patient is more calm. Likely will require extubation to noninvasive positive pressure ventilation. 3. Acute on chronic renal failure stage III: Improving. Maintaining normotension. Trending urine output and monitoring electrolytes daily. 4. Delirium/agitation: Starting Precedex infusion & tapering off of propofol. Continuing fentanyl drip for pain relief. 5. Hyperglycemia: Mild. No known history of diabetes mellitus. May require addition/increase of insulin to TPN. 6. History of colon cancer: Status post resection. 7. History of coronary artery disease: Continuing aspirin suppository. Maintaining normotension. Monitoring patient on telemetry. 8. History of essential hypertension: Holding antihypertensive regimen. Monitoring vitals per unit protocol.   Prophylaxis:  Lovenox subcutaneous daily, Protonix IV every 12 hours, & SCDs. Diet:  Nothing by mouth. Continuing on TPN. Code Status:  Full code per previous physician discussions. Disposition:  Remains intubated & critically ill in the ICU. Family Update:  No family at bedside during rounds this  morning. Wife updated 2/28 by me during rounds.   Remainder of care as per primary service & cardiology.   I have spent a total of 31 minutes of critical care time today caring for the patient and reviewing the patient's electronic medical record.  Sonia Baller Ashok Cordia, M.D. [facility] Pulmonary [facility] Critical [facility] Pager:  [phone] After 3pm or if no response, call [phone] 7:57 AM 04/21/16

## 2016-04-21 NOTE — Progress Notes (Signed)
CRITICAL VALUE ALERT  Critical value received:  Troponin 0.03  Date of notification:  04/21/16  Time of notification:  10:51 PM  Critical value read back:Yes.    Nurse who received alert:  Dorene Grebe, RN  MD notified (1st page):  eLink MD  Time of first page:  10:52 PM  MD notified (2nd page):  Time of second page:  Responding MD:  Dr. Lake Bells, MD  Time MD responded:  10:53 PM   No new orders received at this time.  Sherlie Ban, RN

## 2016-04-21 NOTE — Progress Notes (Signed)
GS Progress Note Subjective: Patient not extubated, but weaned well yesterday.  PBG good.  Sedated currently.  Objective: Vital signs in last 24 hours: Temp:  [98.7 F (37.1 C)-100.2 F (37.9 C)] 99.8 F (37.7 C) (03/01 0400) Pulse Rate:  [108-144] 109 (03/01 0700) Resp:  [9-28] 16 (03/01 0700) BP: (92-138)/(57-75) 105/59 (03/01 0700) SpO2:  [93 %-100 %] 96 % (03/01 0700) Arterial Line BP: (65-147)/(39-63) 100/39 (03/01 0700) FiO2 (%):  [40 %-50 %] 40 % (03/01 0444) Weight:  [176.4 kg (389 lb)] 176.4 kg (389 lb) (03/01 0500) Last BM Date: 04/20/16  Intake/Output from previous day: 02/28 0701 - 03/01 0700 In: 7105.6 [I.V.:5933.7; NG/GT:30; IV Piggyback:1141.9] Out: 2663 [Urine:2125; Emesis/NG output:425; Drains:63; Stool:50] Intake/Output this shift: No intake/output data recorded.  Lungs: clear to auscultation.  Sats are good  Abd: distended, minimal NGT output.  Loop ileostomy is fine  Will have wound care nurse to see  Extremities: No changes.  Duplex studies demonstrate no DVT  Neuro: Sedated  Lab Results: CBC   Recent Labs  04/20/16 0553 04/21/16 0345  WBC 10.9* 10.4  HGB 10.2* 9.6*  HCT 33.9* 32.9*  PLT 176 170   BMET  Recent Labs  04/20/16 0553 04/21/16 0345  NA 143 143  K 3.8 4.0  CL 109 110  CO2 28 27  GLUCOSE 162* 145*  BUN 39* 32*  CREATININE 3.23* 2.51*  CALCIUM 7.7* 8.1*   PT/INR No results for input(s): LABPROT, INR in the last 72 hours. ABG  Recent Labs  04/19/16 1902 04/20/16 0411  PHART 7.395 7.405  HCO3 31.0* 26.8    Studies/Results: Dg Chest Port 1 View  Result Date: 04/20/2016 CLINICAL DATA:  Respiratory failure, intubated patient. History of coronary angioplasty and stent placement. Status post colostomy reversal 8 days ago. EXAM: PORTABLE CHEST 1 VIEW COMPARISON:  Portable chest x-ray of April 19, 2016 FINDINGS: The lungs are reasonably well inflated. There is bibasilar atelectasis or pneumonia. The cardiac silhouette  is enlarged. The pulmonary vascularity is engorged. There are probable small posterior layering bilateral pleural effusions. The endotracheal tube tip lies 5.3 cm above the carina. The esophagogastric tube tip and proximal port project below the inferior margin of the image. The left internal jugular venous catheter tip projects over the midportion of the SVC. IMPRESSION: CHF with mild interstitial edema and bilateral pleural effusions. Persistent left lower lobe atelectasis or pneumonia. The support tubes are in reasonable position. Electronically Signed   By: David  Martinique M.D.   On: 04/20/2016 07:56   Dg Chest Port 1 View  Result Date: 04/19/2016 CLINICAL DATA:  Right IJ line placement EXAM: PORTABLE CHEST 1 VIEW COMPARISON:  04/07/2016 CXR FINDINGS: Heart is top normal. Patient is slightly rotated with chin obscuring the apices. Right IJ central line catheter is noted projecting toward the expected level of the proximal SVC. No pneumothorax. Small right and small to moderate left pleural effusions are new with adjacent atelectasis. Mild vascular congestion is noted. Endotracheal tube tip projects approximately 4.6 cm above the carina. Gastric tube extends below the left hemidiaphragm although the tip is not visualized on this study. IMPRESSION: Tip of right IJ catheter projects over the region of the proximal SVC. No pneumothorax. Satisfactory endotracheal tube tip position. Gastric tube extends below the left hemidiaphragm though tip is not included on this exam. Cardiomegaly with small right and small to moderate left pleural effusions. Electronically Signed   By: Ashley Royalty M.D.   On: 04/19/2016 19:08   Dg  Abd Portable 1v  Result Date: 04/19/2016 CLINICAL DATA:  Abdominal pain. Reversal of colostomy on 04/12/2016. EXAM: PORTABLE ABDOMEN - 1 VIEW COMPARISON:  04/16/2016 and 04/15/2016 and 02/23/2013 FINDINGS: NG tube is been removed. Free air in the abdomen. Slightly dilated small bowel loops in the  left mid abdomen persist the stomach is not distended. Colon is not distended. Surgical staples and soft tissue drain are noted. IMPRESSION: Persistent dilatation of small-bowel loops in the left mid abdomen. Moderate free air in the abdomen. Electronically Signed   By: Lorriane Shire M.D.   On: 04/19/2016 10:23    Anti-infectives: Anti-infectives    Start     Dose/Rate Route Frequency Ordered Stop   04/21/16 1000  anidulafungin (ERAXIS) 100 mg in sodium chloride 0.9 % 100 mL IVPB     100 mg over 90 Minutes Intravenous Every 24 hours 04/20/16 1008     04/20/16 1045  anidulafungin (ERAXIS) 200 mg in sodium chloride 0.9 % 200 mL IVPB     200 mg over 180 Minutes Intravenous  Once 04/20/16 1008 04/20/16 1422   04/19/16 2030  piperacillin-tazobactam (ZOSYN) IVPB 3.375 g     3.375 g 12.5 mL/hr over 240 Minutes Intravenous Every 8 hours 04/19/16 1958     04/19/16 1230  cefoTEtan in Dextrose 5% (CEFOTAN) 2-2.08 GM-% IVPB    Comments:  Ivar Drape   : cabinet override      04/19/16 1230 04/19/16 1245   04/12/16 2000  cefoTEtan (CEFOTAN) 2 g in dextrose 5 % 50 mL IVPB     2 g 100 mL/hr over 30 Minutes Intravenous Every 12 hours 04/12/16 1120 04/12/16 2125   04/12/16 0700  cefoTEtan (CEFOTAN) 2 g in dextrose 5 % 50 mL IVPB     2 g 100 mL/hr over 30 Minutes Intravenous To ShortStay Surgical 04/11/16 1404 04/12/16 0753   04/12/16 0548  cefoTEtan in Dextrose 5% (CEFOTAN) 2-2.08 GM-% IVPB    Comments:  Precious Haws   : cabinet override      04/12/16 0548 04/12/16 1759   04/12/16 0544  neomycin (MYCIFRADIN) tablet 1,000 mg  Status:  Discontinued     1,000 mg Oral 3 times per day 04/12/16 0544 04/12/16 0547   04/12/16 0544  metroNIDAZOLE (FLAGYL) tablet 1,000 mg  Status:  Discontinued     1,000 mg Oral 3 times per day 04/12/16 0544 04/12/16 0547      Assessment/Plan: s/p Procedure(s): EXPLORATORY LAPAROTOMY, REPAIR OF ANASTAMOTIC LEAK COLOSTOMY Wound care to see  Extubate possibly  LOS: 9  days    Kathryne Eriksson. Dahlia Bailiff, MD, FACS [phone] [facility] Surgery 04/21/2016

## 2016-04-22 ENCOUNTER — Inpatient Hospital Stay (HOSPITAL_COMMUNITY): Payer: Medicare Other

## 2016-04-22 LAB — URINALYSIS, ROUTINE W REFLEX MICROSCOPIC
BILIRUBIN URINE: NEGATIVE
Glucose, UA: NEGATIVE mg/dL
Ketones, ur: NEGATIVE mg/dL
Leukocytes, UA: NEGATIVE
NITRITE: NEGATIVE
PH: 5 (ref 5.0–8.0)
Protein, ur: 30 mg/dL — AB
SPECIFIC GRAVITY, URINE: 1.015 (ref 1.005–1.030)
Squamous Epithelial / LPF: NONE SEEN

## 2016-04-22 LAB — CBC WITH DIFFERENTIAL/PLATELET
BASOS ABS: 0 10*3/uL (ref 0.0–0.1)
BASOS PCT: 0 %
Eosinophils Absolute: 0.3 10*3/uL (ref 0.0–0.7)
Eosinophils Relative: 5 %
HEMATOCRIT: 30.1 % — AB (ref 39.0–52.0)
HEMOGLOBIN: 9.1 g/dL — AB (ref 13.0–17.0)
Lymphocytes Relative: 8 %
Lymphs Abs: 0.6 10*3/uL — ABNORMAL LOW (ref 0.7–4.0)
MCH: 27.7 pg (ref 26.0–34.0)
MCHC: 30.2 g/dL (ref 30.0–36.0)
MCV: 91.8 fL (ref 78.0–100.0)
Monocytes Absolute: 0.6 10*3/uL (ref 0.1–1.0)
Monocytes Relative: 9 %
NEUTROS ABS: 5.3 10*3/uL (ref 1.7–7.7)
NEUTROS PCT: 78 %
Platelets: 130 10*3/uL — ABNORMAL LOW (ref 150–400)
RBC: 3.28 MIL/uL — ABNORMAL LOW (ref 4.22–5.81)
RDW: 15.6 % — AB (ref 11.5–15.5)
WBC: 6.8 10*3/uL (ref 4.0–10.5)

## 2016-04-22 LAB — COMPREHENSIVE METABOLIC PANEL
ALK PHOS: 60 U/L (ref 38–126)
ALT: 21 U/L (ref 17–63)
ANION GAP: 8 (ref 5–15)
AST: 21 U/L (ref 15–41)
Albumin: 1.7 g/dL — ABNORMAL LOW (ref 3.5–5.0)
BILIRUBIN TOTAL: 1 mg/dL (ref 0.3–1.2)
BUN: 34 mg/dL — ABNORMAL HIGH (ref 6–20)
CALCIUM: 8.1 mg/dL — AB (ref 8.9–10.3)
CO2: 25 mmol/L (ref 22–32)
Chloride: 111 mmol/L (ref 101–111)
Creatinine, Ser: 3.29 mg/dL — ABNORMAL HIGH (ref 0.61–1.24)
GFR calc non Af Amer: 19 mL/min — ABNORMAL LOW (ref >60)
GFR, EST AFRICAN AMERICAN: 21 mL/min — AB (ref >60)
Glucose, Bld: 164 mg/dL — ABNORMAL HIGH (ref 65–99)
Potassium: 4.3 mmol/L (ref 3.5–5.1)
Sodium: 144 mmol/L (ref 135–145)
TOTAL PROTEIN: 5.1 g/dL — AB (ref 6.5–8.1)

## 2016-04-22 LAB — PHOSPHORUS: Phosphorus: 3.6 mg/dL (ref 2.5–4.6)

## 2016-04-22 LAB — GLUCOSE, CAPILLARY
GLUCOSE-CAPILLARY: 143 mg/dL — AB (ref 65–99)
GLUCOSE-CAPILLARY: 148 mg/dL — AB (ref 65–99)
GLUCOSE-CAPILLARY: 157 mg/dL — AB (ref 65–99)
Glucose-Capillary: 143 mg/dL — ABNORMAL HIGH (ref 65–99)

## 2016-04-22 LAB — CULTURE, RESPIRATORY W GRAM STAIN: Culture: NORMAL

## 2016-04-22 LAB — MAGNESIUM: Magnesium: 2.1 mg/dL (ref 1.7–2.4)

## 2016-04-22 LAB — TROPONIN I
TROPONIN I: 0.08 ng/mL — AB (ref <0.03)
Troponin I: 0.03 ng/mL (ref <0.03)
Troponin I: <0.03 ng/mL (ref <0.03)

## 2016-04-22 MED ORDER — TRACE MINERALS CR-CU-MN-SE-ZN 10-1000-500-60 MCG/ML IV SOLN
INTRAVENOUS | Status: AC
  Administered 2016-04-22: 17:00:00 via INTRAVENOUS
  Filled 2016-04-22: qty 1992

## 2016-04-22 MED ORDER — PIVOT 1.5 CAL PO LIQD
1000.0000 mL | ORAL | Status: DC
  Administered 2016-04-22 – 2016-04-24 (×3): 1000 mL

## 2016-04-22 MED ORDER — MIDAZOLAM HCL 2 MG/2ML IJ SOLN
1.0000 mg | INTRAMUSCULAR | Status: DC | PRN
  Administered 2016-04-22 – 2016-04-25 (×16): 2 mg via INTRAVENOUS
  Administered 2016-04-25 – 2016-04-26 (×2): 4 mg via INTRAVENOUS
  Administered 2016-04-26: 2 mg via INTRAVENOUS
  Administered 2016-04-26 (×3): 4 mg via INTRAVENOUS
  Administered 2016-04-26 – 2016-04-27 (×3): 2 mg via INTRAVENOUS
  Administered 2016-04-27 (×2): 4 mg via INTRAVENOUS
  Administered 2016-04-27: 2 mg via INTRAVENOUS
  Administered 2016-04-27 (×4): 4 mg via INTRAVENOUS
  Administered 2016-04-27: 2 mg via INTRAVENOUS
  Administered 2016-04-27: 4 mg via INTRAVENOUS
  Administered 2016-04-27: 2 mg via INTRAVENOUS
  Administered 2016-04-27 – 2016-04-28 (×2): 4 mg via INTRAVENOUS
  Administered 2016-04-28: 2 mg via INTRAVENOUS
  Administered 2016-04-28 (×8): 4 mg via INTRAVENOUS
  Filled 2016-04-22: qty 4
  Filled 2016-04-22 (×4): qty 2
  Filled 2016-04-22: qty 4
  Filled 2016-04-22: qty 2
  Filled 2016-04-22: qty 4
  Filled 2016-04-22: qty 2
  Filled 2016-04-22 (×2): qty 4
  Filled 2016-04-22: qty 2
  Filled 2016-04-22 (×6): qty 4
  Filled 2016-04-22: qty 2
  Filled 2016-04-22: qty 4
  Filled 2016-04-22: qty 2
  Filled 2016-04-22 (×5): qty 4
  Filled 2016-04-22 (×2): qty 2
  Filled 2016-04-22 (×2): qty 4
  Filled 2016-04-22 (×2): qty 2
  Filled 2016-04-22 (×2): qty 4
  Filled 2016-04-22: qty 2
  Filled 2016-04-22: qty 4
  Filled 2016-04-22 (×3): qty 2
  Filled 2016-04-22 (×3): qty 4
  Filled 2016-04-22 (×3): qty 2

## 2016-04-22 MED ORDER — FENTANYL 2500MCG IN NS 250ML (10MCG/ML) PREMIX INFUSION
25.0000 ug/h | INTRAVENOUS | Status: DC
  Administered 2016-04-22: 200 ug/h via INTRAVENOUS
  Administered 2016-04-23 (×3): 400 ug/h via INTRAVENOUS
  Administered 2016-04-24: 225 ug/h via INTRAVENOUS
  Administered 2016-04-24 (×2): 400 ug/h via INTRAVENOUS
  Administered 2016-04-24: 375 ug/h via INTRAVENOUS
  Administered 2016-04-25: 400 ug/h via INTRAVENOUS
  Administered 2016-04-25 (×3): 300 ug/h via INTRAVENOUS
  Administered 2016-04-26: 250 ug/h via INTRAVENOUS
  Administered 2016-04-26: 300 ug/h via INTRAVENOUS
  Administered 2016-04-26: 250 ug/h via INTRAVENOUS
  Administered 2016-04-27: 400 ug/h via INTRAVENOUS
  Administered 2016-04-27: 325 ug/h via INTRAVENOUS
  Administered 2016-04-27: 350 ug/h via INTRAVENOUS
  Administered 2016-04-27 – 2016-04-30 (×15): 400 ug/h via INTRAVENOUS
  Administered 2016-05-01: 40 ug/h via INTRAVENOUS
  Administered 2016-05-01 – 2016-05-04 (×10): 400 ug/h via INTRAVENOUS
  Administered 2016-05-04: 360 ug/h via INTRAVENOUS
  Administered 2016-05-04: 400 ug/h via INTRAVENOUS
  Administered 2016-05-04: 280 ug/h via INTRAVENOUS
  Administered 2016-05-04: 400 ug/h via INTRAVENOUS
  Administered 2016-05-05: 350 ug/h via INTRAVENOUS
  Administered 2016-05-05: 280 ug/h via INTRAVENOUS
  Administered 2016-05-05: 200 ug/h via INTRAVENOUS
  Administered 2016-05-05: 400 ug/h via INTRAVENOUS
  Administered 2016-05-06: 250 ug/h via INTRAVENOUS
  Administered 2016-05-06 – 2016-05-07 (×2): 300 ug/h via INTRAVENOUS
  Administered 2016-05-07: 200 ug/h via INTRAVENOUS
  Administered 2016-05-07: 250 ug/h via INTRAVENOUS
  Administered 2016-05-08: 200 ug/h via INTRAVENOUS
  Administered 2016-05-08 – 2016-05-09 (×2): 150 ug/h via INTRAVENOUS
  Administered 2016-05-09 – 2016-05-10 (×2): 125 ug/h via INTRAVENOUS
  Filled 2016-04-22 (×65): qty 250

## 2016-04-22 NOTE — Progress Notes (Signed)
CRITICAL VALUE ALERT  Critical value received:  Troponin 0.08   Date of notification:  04/22/16  Time of notification:  6:59 PM   Critical value read back:yes  Nurse who received alert:  Sherrie Mustache   MD notified (1st page):  Dr. Lake Bells   Time of first page:  7:00 PM   MD notified (2nd page):  Time of second page:  Responding MD: Dr. Lake Bells  Time MD responded:  7:20 PM

## 2016-04-22 NOTE — Progress Notes (Signed)
GS Progress Note Subjective: Patient not weaning this morning.  Got very anxious with weaning this AM  Had to be sedated.    Objective: Vital signs in last 24 hours: Temp:  [100.3 F (37.9 C)-102.8 F (39.3 C)] 101.2 F (38.4 C) (03/02 0400) Pulse Rate:  [87-122] 97 (03/02 0700) Resp:  [14-21] 16 (03/02 0700) BP: (87-120)/(48-74) 105/66 (03/02 0700) SpO2:  [92 %-99 %] 97 % (03/02 0700) Arterial Line BP: (83-94)/(48-67) 83/67 (03/01 1100) FiO2 (%):  [40 %] 40 % (03/02 0400) Weight:  [183.3 kg (404 lb)] 183.3 kg (404 lb) (03/02 0400) Last BM Date: 04/20/16  Intake/Output from previous day: 03/01 0701 - 03/02 0700 In: 5602.3 [I.V.:5452.3; IV Piggyback:150] Out: 2193 [Urine:1325; Emesis/NG output:625; Drains:33; Stool:210] Intake/Output this shift: No intake/output data recorded.  Lungs: Clear to auscultation.  Sats are good on 40%  Abd: Distended, minimal blake drain output.  Ileostomy output 210 cc of enteric fluid.  Will start tube feedings today.  Extremities: No changes  Neuro: Sedated but very anxious when awakened  Lab Results: CBC   Recent Labs  04/20/16 0553 04/21/16 0345  WBC 10.9* 10.4  HGB 10.2* 9.6*  HCT 33.9* 32.9*  PLT 176 170   BMET  Recent Labs  04/20/16 0553 04/21/16 0345  NA 143 143  K 3.8 4.0  CL 109 110  CO2 28 27  GLUCOSE 162* 145*  BUN 39* 32*  CREATININE 3.23* 2.51*  CALCIUM 7.7* 8.1*   PT/INR No results for input(s): LABPROT, INR in the last 72 hours. ABG  Recent Labs  04/19/16 1902 04/20/16 0411  PHART 7.395 7.405  HCO3 31.0* 26.8    Studies/Results: No results found.  Anti-infectives: Anti-infectives    Start     Dose/Rate Route Frequency Ordered Stop   04/21/16 1000  anidulafungin (ERAXIS) 100 mg in sodium chloride 0.9 % 100 mL IVPB     100 mg over 90 Minutes Intravenous Every 24 hours 04/20/16 1008     04/20/16 1045  anidulafungin (ERAXIS) 200 mg in sodium chloride 0.9 % 200 mL IVPB     200 mg over 180  Minutes Intravenous  Once 04/20/16 1008 04/20/16 1422   04/19/16 2030  piperacillin-tazobactam (ZOSYN) IVPB 3.375 g     3.375 g 12.5 mL/hr over 240 Minutes Intravenous Every 8 hours 04/19/16 1958     04/19/16 1230  cefoTEtan in Dextrose 5% (CEFOTAN) 2-2.08 GM-% IVPB    Comments:  Ivar Drape   : cabinet override      04/19/16 1230 04/19/16 1245   04/12/16 2000  cefoTEtan (CEFOTAN) 2 g in dextrose 5 % 50 mL IVPB     2 g 100 mL/hr over 30 Minutes Intravenous Every 12 hours 04/12/16 1120 04/12/16 2125   04/12/16 0700  cefoTEtan (CEFOTAN) 2 g in dextrose 5 % 50 mL IVPB     2 g 100 mL/hr over 30 Minutes Intravenous To [facility]Stay Surgical 04/11/16 1404 04/12/16 0753   04/12/16 0548  cefoTEtan in Dextrose 5% (CEFOTAN) 2-2.08 GM-% IVPB    Comments:  Precious Haws   : cabinet override      04/12/16 0548 04/12/16 1759   04/12/16 0544  neomycin (MYCIFRADIN) tablet 1,000 mg  Status:  Discontinued     1,000 mg Oral 3 times per day 04/12/16 0544 04/12/16 0547   04/12/16 0544  metroNIDAZOLE (FLAGYL) tablet 1,000 mg  Status:  Discontinued     1,000 mg Oral 3 times per day 04/12/16 0544 04/12/16 0547  Assessment/Plan: s/p Procedure(s): EXPLORATORY LAPAROTOMY, REPAIR OF ANASTAMOTIC LEAK COLOSTOMY Tube feedings  Check labs tomorrow.  LOS: 10 days    Kathryne Eriksson. Dahlia Bailiff, MD, FACS [phone] [facility] Surgery 04/22/2016

## 2016-04-22 NOTE — Progress Notes (Signed)
[facility] Attending Note  Presenting HPI:  66 y.o. 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 Presented on 2/20 for reversal of colostomy and developed postop ileus. Patient developed recurrent abdominal pain on 2/26 and taken back to the OR on 2/27 finding free air with peritonitis and anastomotic leakage. Patient returned from the operating room on ventilator and with ongoing shock.  Subjective:  Patient had no acute events overnight. Successfully transitioned to Precedex infusion and continued on fentanyl drip. Attempted spontaneous breathing trial this morning but became very anxious. Patient was febrile overnight.   Review of Systems:  Unable to obtain with endotracheal intubation and sedation.  Vent Mode: PRVC FiO2 (%):  [40 %] 40 % Set Rate:  [14 bmp] 14 bmp Vt Set:  [580 mL-6015 mL] 6015 mL PEEP:  [5 cmH20] 5 cmH20 Pressure Support:  [5 cmH20-8 cmH20] 5 cmH20 Plateau Pressure:  [14 cmH20-24 cmH20] 24 cmH20  Temp:  [100.3 F (37.9 C)-102.8 F (39.3 C)] 100.3 F (37.9 C) (03/02 0700) Pulse Rate:  [87-122] 96 (03/02 0722) Resp:  [15-21] 16 (03/02 0722) BP: (87-120)/(48-74) 105/66 (03/02 0722) SpO2:  [92 %-100 %] 100 % (03/02 0805) Arterial Line BP: (83-88)/(53-67) 83/67 (03/01 1100) FiO2 (%):  [40 %] 40 % (03/02 0805) Weight:  [404 lb (183.3 kg)] 404 lb (183.3 kg) (03/02 0400)  Gen.: No family at bedside. Morbidly obese. Eyes closed and seemingly comfortable at the time of my exam. Integument: Warm and dry. No rash on exposed skin. Abdominal dressing remains clean and dry. Genitourinary: Foley catheter in place. Small amount of blood around Foley catheter at the penile meatus. Pulmonary: Distant breath sounds. Symmetric chest wall rise on ventilator. Cardiovascular: Regular rhythm. No edema. Unable to appreciate JVD given body habitus. Neurological: Sedated. Not following commands. Pupils  symmetric and reactive.  LINES/TUBES: OETT 7.5 2/27 >>> R IJ CVL 2/27 >>> LUE DL PICC 2/26 >>> Foley >>> L NGT 2/27 >>> L RADIAL ART LINE 2/27 >>> PIV  CBC Latest Ref Rng & Units 04/21/2016 04/20/2016 04/19/2016  WBC 4.0 - 10.5 K/uL 10.4 10.9(H) 11.1(H)  Hemoglobin 13.0 - 17.0 g/dL 9.6(L) 10.2(L) 11.6(L)  Hematocrit 39.0 - 52.0 % 32.9(L) 33.9(L) 38.8(L)  Platelets 150 - 400 K/uL 170 176 208   BMP Latest Ref Rng & Units 04/21/2016 04/20/2016 04/19/2016  Glucose 65 - 99 mg/dL 145(H) 162(H) 136(H)  BUN 6 - 20 mg/dL 32(H) 39(H) 36(H)  Creatinine 0.61 - 1.24 mg/dL 2.51(H) 3.23(H) 3.36(H)  Sodium 135 - 145 mmol/L 143 143 145  Potassium 3.5 - 5.1 mmol/L 4.0 3.8 4.5  Chloride 101 - 111 mmol/L 110 109 105  CO2 22 - 32 mmol/L _0 Calcium 8.9 - 10.3 mg/dL 8.1(L) 7.7(L) 8.5(L)   Hepatic Function Latest Ref Rng & Units 04/21/2016 04/19/2016 04/07/2016  Total Protein 6.5 - 8.1 g/dL 4.9(L) 6.5 7.6  Albumin 3.5 - 5.0 g/dL 2.0(L) 2.8(L) 3.9  AST 15 - 41 U/L _1 ALT 17 - 63 U/L 21 32 32  Alk Phosphatase 38 - 126 U/L 50 61 109  Total Bilirubin 0.3 - 1.2 mg/dL 1.0 1.5(H) 1.2  Bilirubin, Direct <=0.2 mg/dL - - -    IMAGING/STUDIES: TTE 2/24: LV normal in size with moderate LVH. EF 55-60% with no regional wall motion abnormalities. Unable to assess diastolic function. LA poorly & RA poorly visualized. RV poorly visualized but cavity size appeared normal. No aortic stenosis or  regurgitation. Aortic root mildly dilated. No mitral stenosis or regurgitation. No pulmonic stenosis. Tricuspid valve poorly visualized but no regurgitation. No pericardial effusion. PORT CXR 2/28:  Previously reviewed by me. Endotracheal tube 5 cm above carina. Bilateral lower lobe opacities. Enteric feeding tube unchanged in position.  MICROBIOLOGY: Blood Cultures x2 2/27 >>> Tracheal Aspirate Culture 2/28 >>> MRSA PCR 2/28:  Negative  Blood Cultures x2 3/2 >>> Urine Culture 3/2 >>>  ANTIBIOTICS: Cefotan 2/20  (periop prophylaxis) Zosyn 2/27 >>> Eraxis 2/28 >>>  SIGNIFICANT EVENTS: 02/20 - Admit for colostomy reversal 02/23 - Postop ileus & tachycardia 02/24 - Cardiology Consulted 02/27 - OR for Exploratory Laparotomy finding anastomotic leak & peritonitis  ASSESSMENT/PLAN:  66 y.o. male with peritonitis from bowel anastomotic leak. Patient developed fever overnight which is concerning for new source of infection. Labs have not been yet performed this morning to determine whether or not diuresis may be a possibility. Remains on vasopressor support. Appears more calm on his Precedex drip.   1. Severe sepsis with septic shock: Shock has resolved. Likely secondary to peritonitis. Continuing to trend Procalcitonin per algorithm. Continuing broad-spectrum coverage with Zosyn and Eraxis. Repeating blood and urine cultures given fever overnight. Also checking portable chest x-ray. 2. Acute on chronic hypoxic and hypercarbic respiratory failure: Known underlying OSA/OHS. Continuing full ventilator support. Reattempting daily spontaneous breathing trial. Agitation/encephalopathy/delirium is the main barrier to his extubation. Holding on diuresis pending reevaluation of his renal function with labs this morning. 3. Acute on chronic stage III renal failure: Repeating electrolytes this morning. Continuing to monitor urine output. Holding on diuresis at the moment. 4. Delirium/agitation/acute encephalopathy: Continuing Precedex infusion and fentanyl drip for pain relief. Limiting benzodiazepines. 5. Penile bleeding: Mild and present only around the meatus. Continuing to monitor. Foley remains in place. 6. Hyperglycemia: Mild. No history of diabetes mellitus. Monitoring serum glucose on daily labs. Next line history of colon cancer: Status post resection. 7. History of coronary artery disease: Continuing aspirin suppository. Maintaining normotension. Monitoring patient on telemetry. 8. History of essential  hypertension: Holding antihypertensive regimen. Monitoring vitals per unit protocol.   Prophylaxis:  Lovenox subcutaneous daily, Protonix IV every 12 hours, & SCDs. Diet:  Nothing by mouth. Continuing on TPN. Code Status:  Full code per previous physician discussions. Disposition:  Remains intubated & critically ill in the ICU. Family Update:  No family at bedside during my rounds this morning.  Remainder of care as per primary service & cardiology.   I have spent a total of 33 minutes of critical care time today caring for the patient and reviewing the patient's electronic medical record.  Sonia Baller Ashok Cordia, M.D. [facility] Pager:  [phone] After 3pm or if no response, call [phone] 9:22 AM 04/22/16

## 2016-04-22 NOTE — Progress Notes (Signed)
Nutrition Follow-up  DOCUMENTATION CODES:   Morbid obesity  INTERVENTION:  TPN per Pharmacy.  Continue Pivot 1.5 formula at 20 ml/hr via NGT to provide 720 kcal, 45 grams of protein, and 365 ml of free water.   Once TF may be advanced past 20 ml/hr, recommend weaning TPN and increasing tube feeds of Pivot 1.5 formula to new goal rate of 30 ml/hr with 60 ml Prostat QID to provide 1880 kcal, 188 grams of protein, and 547 ml of free water.   RD to continue to monitor.   NUTRITION DIAGNOSIS:   Inadequate oral intake related to inability to eat as evidenced by NPO status; ongoing  GOAL:   Provide needs based on ASPEN/SCCM guidelines; progressing  MONITOR:   Diet advancement, Labs, Weight trends, I & O's  REASON FOR ASSESSMENT:   Consult New TPN/TNA  ASSESSMENT:   66 yo Male who is several years out from an emergency colectomy with colostomy of the right transverse colon for an obstructing tumor; presented for reversal of his colostomy.  Pt s/p procedure 2/21: REVERSAL OF COLOSTOMY  Patient is currently intubated on ventilator support Temp (24hrs), Avg:101 F (38.3 C), Min:99.8 F (37.7 C), Max:102.8 F (39.3 C) Propofol: none   Pivot 1.5 formula has been started at 20 ml/hr via NGT which is providing 720 kcal, 45 grams of protein, and 365 ml of free water. Once tube feeds may be advanced by 20 ml/hr, recommend weaning TPN and increasing tube feeds of Pivot 1.5 formula to new goal rate of 30 ml/hr with 60 ml Prostat QID to provide 1880 kcal, 188 grams of protein, and 547 ml of free water.   Per Pharmacy note, plans to continue Clinimix E 5/15 at 14ml/h and hold IV lipid emulsions while transitioning to enteral feeds and while in the ICU per ASPEN/SCCM guidelines. TPN and current tube feeding rate provides 2134 kcal (100% of kcal needs), 145 grams of protein (78% of protein needs).  Labs and medications reviewed.   Diet Order:  Diet NPO time specified .TPN (CLINIMIX-E)  Adult .TPN (CLINIMIX-E) Adult  Skin:   (Incision on abdomen)  Last BM:  Ileostomy  Height:   Ht Readings from Last 1 Encounters:  04/22/16 5' 10.5" (1.791 m)    Weight:   Wt Readings from Last 1 Encounters:  04/22/16 (!) 404 lb (183.3 kg)  Admit weight 04/12/16 377 lbs (171 kg)  Ideal Body Weight:  75 kg  BMI:  Body mass index is 57.15 kg/m.  Estimated Nutritional Needs:   Kcal:  [id number]  Protein:  >/= 187 gm  Fluid:  per MD  EDUCATION NEEDS:   No education needs identified at this time  Larry Parker, MS, RD, LDN Pager # [phone] After hours/ weekend pager # [phone]

## 2016-04-22 NOTE — Progress Notes (Signed)
Pt has been weaning since 1035. Pt's wife states that the Pt can be very stubborn.

## 2016-04-22 NOTE — Progress Notes (Signed)
PHARMACY - ADULT TOTAL PARENTERAL NUTRITION CONSULT NOTE   Pharmacy Consult:  TPN Indication:  Prolonged ileus  Patient Measurements: Height: 5' 10.5" (179.1 cm) Weight: (!) 404 lb (183.3 kg) IBW/kg (Calculated) : 74.15 TPN AdjBW (KG): 101.4 Body mass index is 57.15 kg/m.  Assessment:  65 YOM with history of an obstructing tumor s/p emergent colectomy with colostomy of right transverse colon in 2014.  Patient presented on 04/12/16 for reversal of colostomy.  Postoperatively, he had emesis on 04/14/16 and a distended abdomen on 04/15/16.  He has consumed </= 10% of his meals for 7 days and Pharmacy consulted to manage TPN for prolonged ileus.  Patient reports eating regularly and gained 3-4 lbs before admission.   GI: Morbidly obese. S/p ex-lap and repair of leak on 2/27. NG tube placed, output yesterday was 676ml. Last BM 2/28. Albumin down to 1.7. Prealbumin wnl at 21. Simethicone, PPI IV.  Endo: No hx DM, A1c 5.5% - CBGs controlled (120-140s, one value at 219) Insulin requirements in the past 24 hours: 6 units of SSI Lytes: WNL.K 4.3 (goal > 4 for ileus), CoCa 9.9, Mg 2.1 (goal > 2 for ileus), and Phos okay. *days without lytes in TPN:  2/26 Renal: CKD3. SCr up to 3.29 (peaked at 3.36), BUN 34, CrCl ~55ml/min. UOP marginal (0.60ml/kg/hr) Flomax, bethanechol, NS KVO.  Pulm: Intubated on 2/27. Unable to wean this AM.  Cards: CM / CHF (EF 55-60%) / CAD post PCI / HLD / HTN / PAD / hx atrial tachycardia and torsades. New tachy (100-120s) started 2/23 likely d/t dehydration and malabsorption of meds. Levophed gtt off, IV metoprolol, ASA PR Hepatobil: LFTS wnl, tbili 1, TG down to 117. Neuro: Propofol off. Continues on fentanyl gtt (353mcg/hr) and precedex (1.2 mcg/kg/hr). Required additional sedation this AM (4mg  versed).  ID: Tmax 102.8 overnight, WBCs wnl. Cx's drawn -NGTD. Zosyn D4 and Eraxis D3 for peritonitis. PCT trending down Heme/Onc: Hx colon cancer - H&H trending down 9.1/30.1, plts  130 Best Practices: Lovenox TPN Access: PICC placed 04/18/16 TPN start date: 04/18/16  Nutritional Goals: per RD recs on 2/27 KCal: 1850-2400 Protein: >/= 187 g  Current Nutrition:  Clinimix E 5/15 Starting TF Pivot 1.5 at 79mL/hr (720 kCal and 45gm per day)  Plan:  Continue Clinimix E 5/15 at 57ml/hr Hold IV lipid emulsions while transitioning to enteral feeds and while in the ICU per ASPEN/SCCM guidelines TPN + TF provides 145 g of protein and 2134 kCals per day meeting 78% of protein and 100% of kCal needs Due to Clinimix shortage and transition to TF, will not increase TPN  Add MVI in TPN Add TE in TPN every other day, next today 3/2 Continue sensitive SSI Q6h and adjust as needed Monitor TPN labs F/U improvement in ileus, NGT output   Argie Ramming, PharmD Pharmacy Resident  Pager [phone] 04/22/16 10:39 AM

## 2016-04-22 NOTE — Progress Notes (Signed)
eLink Physician-Brief Progress Note Patient Name: Larry [name] DOB: 07-09-1950 MRN: [id number]   Date of Service  04/22/2016  HPI/Events of Note  Troponin slightly elevated Renal failure Unchanged tele, clinical status otherwise unchanged  eICU Interventions  monitor     Intervention Category Intermediate Interventions: Diagnostic test evaluation  Simonne Maffucci 04/22/2016, 7:19 PM

## 2016-04-22 NOTE — Progress Notes (Signed)
ANTIBIOTIC CONSULT NOTE  Pharmacy Consult for Zosyn Indication: Peritonitis   Allergies  Allergen Reactions  . No Known Allergies     Patient Measurements: Height: 5' 10.5" (179.1 cm) Weight: (!) 404 lb (183.3 kg) IBW/kg (Calculated) : 74.15 Adjusted Body Weight: 107 kg  Vital Signs: Temp: 100.3 F (37.9 C) (03/02 0700) Temp Source: Axillary (03/02 0700) BP: 105/66 (03/02 0722) Pulse Rate: 96 (03/02 0722) Intake/Output from previous day: 03/01 0701 - 03/02 0700 In: 5602.3 [I.V.:5452.3; IV Piggyback:150] Out: 2193 [Urine:1325; Emesis/NG output:625; Drains:33; Stool:210] Intake/Output from this shift: No intake/output data recorded.  Labs:  Recent Labs  04/19/16 1545 04/20/16 0553 04/21/16 0345  WBC 11.1* 10.9* 10.4  HGB 11.6* 10.2* 9.6*  PLT 208 176 170  CREATININE 3.36* 3.23* 2.51*   Estimated Creatinine Clearance: 50.2 mL/min (by C-G formula based on SCr of 2.51 mg/dL (H)). No results for input(s): VANCOTROUGH, VANCOPEAK, VANCORANDOM, GENTTROUGH, GENTPEAK, GENTRANDOM, TOBRATROUGH, TOBRAPEAK, TOBRARND, AMIKACINPEAK, AMIKACINTROU, AMIKACIN in the last 72 hours.   Microbiology:   Medical History: Past Medical History:  Diagnosis Date  . Arthritis   . Atrial tachycardia ([place])    managed on beta blocker therapy  . Childhood asthma    "went away after I was 16"  . Chronic combined systolic and diastolic CHF, NYHA class 3 (HCC)    has diastolic heart failure grade 1; EF is 45 to 50% per echo 05/2011; EF 41% by Myoview 2016  . CKD (chronic kidney disease) stage 3, GFR 30-59 ml/min   . Colon cancer ([place]) 2015   MSI high; IHC loss of MLH1 and PMS2; BRAF negative; Negative methylation  . COPD (chronic obstructive pulmonary disease) ([place])   . Coronary artery disease   . Family history of ovarian cancer   . Hypercholesteremia   . Hypertension   . Noncompliance   . NSVT (nonsustained ventricular tachycardia) (HCC)    beta blocker restarted  . Obesity   . OSA on  CPAP    used nightly, pt does not know settings  . Peripheral arterial disease (HCC)    4.2 cm thoracic aortic aneurysm per chest ct 11-14-15 epic  . S/P colostomy ([place])    2014  . Thrombocytopenia ([place])   . Torsades de pointes ([place])    X 2 episodes during hospital visit 12'14"electrolyte imbalance"- "Shocked"    Assessment:  ID: Zosyn d4 for peritonitis also on Eraxis D3 -WBC= 10.4, tmax= 102/8, PCT= 6, Last Scr 2.51, estimated CrCl 45  2/27 zosyn>> 2/27 eraxis>>  2/28 resp- GPC/pairs 2/27 blood x2>> 2/28 MRSA PCR- neg  Goal of Therapy:  Eradication of infection   Plan:  Zosyn 3.375g IV q8hr. Dose ok for CrCl down to 20. Pharmacy will sign off. Please reconsult for further dosing assitance.   Emmelyn Schmale S. Alford Highland, PharmD, BCPS Clinical Staff Pharmacist Pager [phone]  Eilene Ghazi Stillinger 04/22/2016,9:56 AM

## 2016-04-23 DIAGNOSIS — J9600 Acute respiratory failure, unspecified whether with hypoxia or hypercapnia: Secondary | ICD-10-CM

## 2016-04-23 LAB — RENAL FUNCTION PANEL
ALBUMIN: 1.6 g/dL — AB (ref 3.5–5.0)
Anion gap: 7 (ref 5–15)
BUN: 31 mg/dL — AB (ref 6–20)
CHLORIDE: 111 mmol/L (ref 101–111)
CO2: 26 mmol/L (ref 22–32)
Calcium: 8.4 mg/dL — ABNORMAL LOW (ref 8.9–10.3)
Creatinine, Ser: 2.88 mg/dL — ABNORMAL HIGH (ref 0.61–1.24)
GFR calc Af Amer: 25 mL/min — ABNORMAL LOW (ref >60)
GFR calc non Af Amer: 22 mL/min — ABNORMAL LOW (ref >60)
GLUCOSE: 172 mg/dL — AB (ref 65–99)
PHOSPHORUS: 3.1 mg/dL (ref 2.5–4.6)
POTASSIUM: 4.3 mmol/L (ref 3.5–5.1)
Sodium: 144 mmol/L (ref 135–145)

## 2016-04-23 LAB — CBC WITH DIFFERENTIAL/PLATELET
Basophils Absolute: 0 10*3/uL (ref 0.0–0.1)
Basophils Relative: 0 %
Eosinophils Absolute: 0.2 10*3/uL (ref 0.0–0.7)
Eosinophils Relative: 4 %
HEMATOCRIT: 30.1 % — AB (ref 39.0–52.0)
Hemoglobin: 8.9 g/dL — ABNORMAL LOW (ref 13.0–17.0)
LYMPHS ABS: 0.7 10*3/uL (ref 0.7–4.0)
LYMPHS PCT: 11 %
MCH: 27.1 pg (ref 26.0–34.0)
MCHC: 29.6 g/dL — AB (ref 30.0–36.0)
MCV: 91.5 fL (ref 78.0–100.0)
MONO ABS: 0.8 10*3/uL (ref 0.1–1.0)
MONOS PCT: 13 %
NEUTROS ABS: 4.5 10*3/uL (ref 1.7–7.7)
NEUTROS PCT: 72 %
Platelets: 132 10*3/uL — ABNORMAL LOW (ref 150–400)
RBC: 3.29 MIL/uL — ABNORMAL LOW (ref 4.22–5.81)
RDW: 15.3 % (ref 11.5–15.5)
WBC: 6.2 10*3/uL (ref 4.0–10.5)

## 2016-04-23 LAB — URINE CULTURE: CULTURE: NO GROWTH

## 2016-04-23 LAB — GLUCOSE, CAPILLARY
GLUCOSE-CAPILLARY: 155 mg/dL — AB (ref 65–99)
GLUCOSE-CAPILLARY: 169 mg/dL — AB (ref 65–99)
GLUCOSE-CAPILLARY: 144 mg/dL — AB (ref 65–99)

## 2016-04-23 LAB — MAGNESIUM: Magnesium: 2.1 mg/dL (ref 1.7–2.4)

## 2016-04-23 LAB — TROPONIN I: Troponin I: <0.03 ng/mL (ref <0.03)

## 2016-04-23 MED ORDER — M.V.I. ADULT IV INJ
INTRAVENOUS | Status: AC
  Administered 2016-04-23: 17:00:00 via INTRAVENOUS
  Filled 2016-04-23: qty 1992

## 2016-04-23 MED ORDER — HEPARIN SODIUM (PORCINE) 5000 UNIT/ML IJ SOLN
5000.0000 [IU] | Freq: Three times a day (TID) | INTRAMUSCULAR | Status: DC
  Administered 2016-04-23 – 2016-05-13 (×60): 5000 [IU] via SUBCUTANEOUS
  Filled 2016-04-23 (×61): qty 1

## 2016-04-23 NOTE — Progress Notes (Signed)
[place] [facility] Attending Note  Presenting HPI:  66 y.o. 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 Presented on 2/20 for reversal of colostomy and developed postop ileus. Patient developed recurrent abdominal pain on 2/26 and taken back to the OR on 2/27 finding free air with peritonitis and anastomotic leakage. Patient returned from the operating room on ventilator and with ongoing shock.  IMAGING/STUDIES: TTE 2/24: LV normal in size with moderate LVH. EF 55-60% with no regional wall motion abnormalities. Unable to assess diastolic function. LA poorly & RA poorly visualized. RV poorly visualized but cavity size appeared normal. No aortic stenosis or regurgitation. Aortic root mildly dilated. No mitral stenosis or regurgitation. No pulmonic stenosis. Tricuspid valve poorly visualized but no regurgitation. No pericardial effusion.   MICROBIOLOGY: Blood Cultures x2 2/27 >>>ng Tracheal Aspirate Culture 2/28 >>> nml flora MRSA PCR 2/28:  Negative  Blood Cultures x2 3/2 >>> Urine Culture 3/2 >>>  ANTIBIOTICS: Cefotan 2/20 (periop prophylaxis) Zosyn 2/27 >>> Eraxis 2/28 >>>  SIGNIFICANT EVENTS: 02/20 - Admit for colostomy reversal 02/23 - Postop ileus & tachycardia 02/24 - Cardiology Consulted 02/27 - OR for Exploratory Laparotomy finding anastomotic leak & peritonitis  3/2   transitioned to Precedex infusion and  fentanyl drip, febrile  Subjective: . Low gr febrile  Critically ill, intubated, non sp agitation per RN when sedation lowered Good UO    Vent Mode: PRVC FiO2 (%):  [40 %] 40 % Set Rate:  [14 bmp] 14 bmp Vt Set:  [580 mL] 580 mL PEEP:  [5 cmH20-8 cmH20] 8 cmH20 Pressure Support:  [5 cmH20] 5 cmH20 Plateau Pressure:  [25 cmH20-30 cmH20] 26 cmH20  Temp:  [97.9 F (36.6 C)-100.2 F (37.9 C)] 98.1 F (36.7 C) (03/03 0335) Pulse Rate:  [76-130] 84 (03/03 0600) Resp:  [15-32] 18 (03/03  0600) BP: (84-129)/(49-104) 118/78 (03/03 0600) SpO2:  [93 %-100 %] 99 % (03/03 0600) FiO2 (%):  [40 %] 40 % (03/03 0342)  Gen.:  Morbidly obese. Acutely ill  No distress Skin: Warm and dry. No rash on exposed skin. Abdominal dressing remains clean and dry. Genitourinary: Foley catheter in place. Small amount of blood around Foley catheter at the penile meatus. Pulmonary: Distant breath sounds. Symmetric chest wall rise on ventilator. Cardiovascular: Regular rhythm. No edema. Unable to appreciate JVD given body habitus. Neurological: Sedated. Not following commands. Pupils symmetric and reactive. Abd-distended , ostomy pink  LINES/TUBES: OETT 7.5 2/27 >>> R IJ CVL 2/27 >>> LUE DL PICC 2/26 >>> Foley >>> L NGT 2/27 >>> L RADIAL ART LINE 2/27 >>> PIV  CBC Latest Ref Rng & Units 04/23/2016 04/22/2016 04/21/2016  WBC 4.0 - 10.5 K/uL 6.2 6.8 10.4  Hemoglobin 13.0 - 17.0 g/dL 8.9(L) 9.1(L) 9.6(L)  Hematocrit 39.0 - 52.0 % 30.1(L) 30.1(L) 32.9(L)  Platelets 150 - 400 K/uL 132(L) 130(L) 170   BMP Latest Ref Rng & Units 04/23/2016 04/22/2016 04/21/2016  Glucose 65 - 99 mg/dL 172(H) 164(H) 145(H)  BUN 6 - 20 mg/dL 31(H) 34(H) 32(H)  Creatinine 0.61 - 1.24 mg/dL 2.88(H) 3.29(H) 2.51(H)  Sodium 135 - 145 mmol/L 144 144 143  Potassium 3.5 - 5.1 mmol/L 4.3 4.3 4.0  Chloride 101 - 111 mmol/L 111 111 110  CO2 22 - 32 mmol/L 26 25 27   Calcium 8.9 - 10.3 mg/dL 8.4(L) 8.1(L) 8.1(L)   Hepatic Function Latest Ref Rng & Units 04/23/2016 04/22/2016 04/21/2016  Total Protein 6.5 - 8.1 g/dL - 5.1(L)  4.9(L)  Albumin 3.5 - 5.0 g/dL 1.6(L) 1.7(L) 2.0(L)  AST 15 - 41 U/L - 21 17  ALT 17 - 63 U/L - 21 21  Alk Phosphatase 38 - 126 U/L - 60 50  Total Bilirubin 0.3 - 1.2 mg/dL - 1.0 1.0  Bilirubin, Direct <=0.2 mg/dL - - -      ASSESSMENT/PLAN:  66 y.o. male with peritonitis from bowel anastomotic leak. Off pressors & improving renal function  1. Septic shock:  Resolved, secondary to peritonitis.  Continuing  broad-spectrum coverage with Zosyn and Eraxis. FU rpt cultures 3/2 2. Acute on chronic hypoxic and hypercarbic respiratory failure: Known underlying OSA/OHS. Continuing full ventilator support. Ct SBT attempts  With PEEP 8 Agitation/encephalopathy/delirium is the main barrier to his extubation.  3. Acute on chronic stage III renal failure: baseline 1.7 Check  Electrolytes daily, monitor urine output. Hold lasix 4. Delirium/agitation/acute encephalopathy: Continuing Precedex infusion and fentanyl drip for pain relief. Limiting benzodiazepines.  5. Penile bleeding: monitor. Foley remains in place. 6. Hyperglycemia: Mild. No history of diabetes mellitus. Monitoring serum glucose on daily labs.  7. History of coronary artery disease: ASA suppository.  8. History of essential hypertension: resume BP meds if rises  Prophylaxis:  Lovenox subcutaneous daily, Protonix  & SCDs. Diet:  Npo , on TPN. Code Status:  Full code  Disposition:  Remains intubated & critically ill in the ICU. Family Update:  No family at bedside  this morning.  My cc time x 13m RKara MeadMD. FCCP. [place] [facility] Pager [phone] no response call [phone]    7:59 AM 04/23/16

## 2016-04-23 NOTE — Progress Notes (Signed)
PHARMACY - ADULT TOTAL PARENTERAL NUTRITION CONSULT NOTE   Pharmacy Consult:  TPN Indication:  Prolonged ileus  Patient Measurements: Height: 5' 10.5" (179.1 cm) Weight: (!) 404 lb (183.3 kg) IBW/kg (Calculated) : 74.15 TPN AdjBW (KG): 101.4 Body mass index is 57.15 kg/m.  Assessment:  27 YOM with history of an obstructing tumor s/p emergent colectomy with colostomy of right transverse colon in 2014.  Patient presented on 04/12/16 for reversal of colostomy.  Postoperatively, he had emesis on 04/14/16 and a distended abdomen on 04/15/16.  He has consumed </= 10% of his meals for 7 days and Pharmacy consulted to manage TPN for prolonged ileus.  Patient reports eating regularly and gained 3-4 lbs before admission.   GI: Morbidly obese. S/p ex-lap and repair of leak on 2/27. NG tube placed, output yesterday was 173ml. Last BM 2/28. Albumin down to 1.6. Prealbumin wnl at 21. Simethicone, PPI IV.  Endo: No hx DM, A1c 5.5% - CBGs mostly controlled but trending up (150-170s) after starting TFs Insulin requirements in the past 24 hours: 6 units of SSI Lytes: WNL. K 4.3 (goal > 4 for ileus), CoCa 10.3, Mg 2.1 (goal > 2 for ileus), and Phos 3.1. *days without lytes in TPN:  2/26 Renal: CKD3. SCr back down to 2.88 (peaked at 3.36), BUN stable at 31, CrCl ~62ml/min. UOP marginal (0.36ml/kg/hr) Flomax, bethanechol, NS KVO.  Pulm: Intubated on 2/27. Remains intubated and unable to wean Cards: CM / CHF (EF 55-60%) / CAD post PCI / HLD / HTN / PAD / hx atrial tachycardia and torsades. New tachy (100-120s) started 2/23 likely d/t dehydration and malabsorption of meds. Levophed gtt off, IV metoprolol, ASA PR Hepatobil: LFTS wnl, tbili 1, TG down to 117. Neuro: GCS 11, RASS 0 (goal -2), CPOT 0. Propofol off. Continues on fentanyl gtt (223mcg/hr) and precedex (1 mcg/kg/hr).  ID: Fever improving with Tmax of 100.3 yesterday, WBCs wnl. Cx's drawn and NGTD. Zosyn D4 and Eraxis D3 for peritonitis. PCT trending  down Heme/Onc: Hx colon cancer - Hgb trending down to 8.9, plts 132 Best Practices: Lovenox TPN Access: PICC placed 04/18/16 TPN start date: 04/18/16  Nutritional Goals: per RD recs on 3/2 KCal: 1850-2400 Protein: >/= 187 g  Current Nutrition:  Clinimix E 5/15 TF Pivot 1.5 at 79mL/hr (720 kCal and 45gm per day)  Plan:  Continue Clinimix E 5/15 at 18ml/hr Hold IV lipid emulsions while transitioning to enteral feeds and while in the ICU per ASPEN/SCCM guidelines TPN + TF provides 145 g of protein and 2134 kCals per day meeting 78% of protein and 100% of kCal needs Due to Clinimix shortage and transition to TF, will not increase TPN further Add MVI in TPN Add TE in TPN every other day, next dose 3/4 Continue sensitive SSI Q6h and adjust as needed Monitor TPN labs, NGT output F/U advancement of TF rate  Elenor Quinones, PharmD, BCPS Clinical Pharmacist Pager [phone] 04/23/2016 7:15 AM

## 2016-04-23 NOTE — Progress Notes (Addendum)
4 Days Post-Op  Subjective: Sedated tol tube feeds  Objective: Vital signs in last 24 hours: Temp:  [97.9 F (36.6 C)-100.2 F (37.9 C)] 98.1 F (36.7 C) (03/03 0335) Pulse Rate:  [76-130] 118 (03/03 0801) Resp:  [15-32] 19 (03/03 0801) BP: (84-129)/(49-104) 124/85 (03/03 0801) SpO2:  [93 %-100 %] 98 % (03/03 0801) FiO2 (%):  [40 %] 40 % (03/03 0801) Last BM Date: 04/20/16  Intake/Output from previous day: 03/02 0701 - 03/03 0700 In: 3686.6 [I.V.:3091.9; NG/GT:344.7; IV Piggyback:250] Out: 3075 [Urine:2925; Emesis/NG output:100; Drains:20; Stool:30] Intake/Output this shift: No intake/output data recorded.  Resp: coarse bilateral breath sounds Cardio: regular rate and rhythm GI: ileostomy pink no output wound open with pink tissue but fasica not healthy no deshiscence now  Lab Results:   Recent Labs  04/22/16 0951 04/23/16 0450  WBC 6.8 6.2  HGB 9.1* 8.9*  HCT 30.1* 30.1*  PLT 130* 132*   BMET  Recent Labs  04/22/16 0951 04/23/16 0450  NA 144 144  K 4.3 4.3  CL 111 111  CO2 25 26  GLUCOSE 164* 172*  BUN 34* 31*  CREATININE 3.29* 2.88*  CALCIUM 8.1* 8.4*   PT/INR No results for input(s): LABPROT, INR in the last 72 hours. ABG No results for input(s): PHART, HCO3 in the last 72 hours.  Invalid input(s): PCO2, PO2  Studies/Results: Dg Chest Port 1 View  Result Date: 04/22/2016 CLINICAL DATA:  Acute respiratory failure with hypoxia. EXAM: PORTABLE CHEST 1 VIEW COMPARISON:  Radiograph of April 20, 2016. FINDINGS: Endotracheal and nasogastric tubes are in grossly good position. Left-sided PICC line is unchanged with distal tip in expected position of the SVC. Right internal jugular catheter is noted with distal tip in expected position of right innominate vein. No pneumothorax is noted. Stable cardiomediastinal silhouette is noted. Left lung is clear. Stable right basilar opacity is noted concerning for atelectasis or infiltrate. Bony thorax is  unremarkable. IMPRESSION: Stable support apparatus. Stable right basilar opacity as described above. Electronically Signed   By: Marijo Conception, M.D.   On: 04/22/2016 12:35    Anti-infectives: Anti-infectives    Start     Dose/Rate Route Frequency Ordered Stop   04/21/16 1000  anidulafungin (ERAXIS) 100 mg in sodium chloride 0.9 % 100 mL IVPB     100 mg over 90 Minutes Intravenous Every 24 hours 04/20/16 1008     04/20/16 1045  anidulafungin (ERAXIS) 200 mg in sodium chloride 0.9 % 200 mL IVPB     200 mg over 180 Minutes Intravenous  Once 04/20/16 1008 04/20/16 1422   04/19/16 2030  piperacillin-tazobactam (ZOSYN) IVPB 3.375 g     3.375 g 12.5 mL/hr over 240 Minutes Intravenous Every 8 hours 04/19/16 1958     04/19/16 1230  cefoTEtan in Dextrose 5% (CEFOTAN) 2-2.08 GM-% IVPB    Comments:  Ivar Drape   : cabinet override      04/19/16 1230 04/19/16 1245   04/12/16 2000  cefoTEtan (CEFOTAN) 2 g in dextrose 5 % 50 mL IVPB     2 g 100 mL/hr over 30 Minutes Intravenous Every 12 hours 04/12/16 1120 04/12/16 2125   04/12/16 0700  cefoTEtan (CEFOTAN) 2 g in dextrose 5 % 50 mL IVPB     2 g 100 mL/hr over 30 Minutes Intravenous To [facility] 04/11/16 1404 04/12/16 0753   04/12/16 0548  cefoTEtan in Dextrose 5% (CEFOTAN) 2-2.08 GM-% IVPB    Comments:  Precious Haws   : cabinet  override      04/12/16 0548 04/12/16 1759   04/12/16 0544  neomycin (MYCIFRADIN) tablet 1,000 mg  Status:  Discontinued     1,000 mg Oral 3 times per day 04/12/16 0544 04/12/16 0547   04/12/16 0544  metroNIDAZOLE (FLAGYL) tablet 1,000 mg  Status:  Discontinued     1,000 mg Oral 3 times per day 04/12/16 0544 04/12/16 0547      Assessment/Plan: POD 13/4 colostomy takedown complicated by leak, reoperation with ileostomy  1. Appreciate ccm assistance 2. Continue mech ventilation today due to mentals status, ongoing sirs 3. Continue trickle feeds 4. Tid dressing changes need to follow this closely for  dehiscence/fascia not healthy 5. Renal function improving 6 scds 7. Change lovenox proph to heparin given renal failure   [facility] 04/23/2016

## 2016-04-24 ENCOUNTER — Inpatient Hospital Stay (HOSPITAL_COMMUNITY): Payer: Medicare Other

## 2016-04-24 DIAGNOSIS — E662 Morbid (severe) obesity with alveolar hypoventilation: Secondary | ICD-10-CM

## 2016-04-24 LAB — GLUCOSE, CAPILLARY
GLUCOSE-CAPILLARY: 140 mg/dL — AB (ref 65–99)
GLUCOSE-CAPILLARY: 152 mg/dL — AB (ref 65–99)
Glucose-Capillary: 129 mg/dL — ABNORMAL HIGH (ref 65–99)
Glucose-Capillary: 141 mg/dL — ABNORMAL HIGH (ref 65–99)
Glucose-Capillary: 150 mg/dL — ABNORMAL HIGH (ref 65–99)

## 2016-04-24 LAB — BASIC METABOLIC PANEL
Anion gap: 5 (ref 5–15)
BUN: 27 mg/dL — ABNORMAL HIGH (ref 6–20)
CALCIUM: 8.4 mg/dL — AB (ref 8.9–10.3)
CO2: 27 mmol/L (ref 22–32)
CREATININE: 2.5 mg/dL — AB (ref 0.61–1.24)
Chloride: 113 mmol/L — ABNORMAL HIGH (ref 101–111)
GFR, EST AFRICAN AMERICAN: 30 mL/min — AB (ref >60)
GFR, EST NON AFRICAN AMERICAN: 26 mL/min — AB (ref >60)
Glucose, Bld: 152 mg/dL — ABNORMAL HIGH (ref 65–99)
Potassium: 4.4 mmol/L (ref 3.5–5.1)
SODIUM: 145 mmol/L (ref 135–145)

## 2016-04-24 LAB — MAGNESIUM: MAGNESIUM: 2.3 mg/dL (ref 1.7–2.4)

## 2016-04-24 LAB — CBC
HCT: 31.9 % — ABNORMAL LOW (ref 39.0–52.0)
Hemoglobin: 9.4 g/dL — ABNORMAL LOW (ref 13.0–17.0)
MCH: 26.8 pg (ref 26.0–34.0)
MCHC: 29.5 g/dL — AB (ref 30.0–36.0)
MCV: 90.9 fL (ref 78.0–100.0)
PLATELETS: 165 10*3/uL (ref 150–400)
RBC: 3.51 MIL/uL — ABNORMAL LOW (ref 4.22–5.81)
RDW: 15.6 % — AB (ref 11.5–15.5)
WBC: 8.8 10*3/uL (ref 4.0–10.5)

## 2016-04-24 LAB — PHOSPHORUS: PHOSPHORUS: 2.7 mg/dL (ref 2.5–4.6)

## 2016-04-24 MED ORDER — METOPROLOL TARTRATE 25 MG/10 ML ORAL SUSPENSION
25.0000 mg | Freq: Two times a day (BID) | ORAL | Status: DC
  Administered 2016-04-24 – 2016-04-26 (×5): 25 mg
  Filled 2016-04-24 (×6): qty 10

## 2016-04-24 MED ORDER — ASPIRIN 325 MG PO TABS
325.0000 mg | ORAL_TABLET | Freq: Every day | ORAL | Status: DC
  Administered 2016-04-24 – 2016-05-04 (×11): 325 mg via ORAL
  Filled 2016-04-24 (×11): qty 1

## 2016-04-24 MED ORDER — TRACE MINERALS CR-CU-MN-SE-ZN 10-1000-500-60 MCG/ML IV SOLN
INTRAVENOUS | Status: AC
  Administered 2016-04-24: 19:00:00 via INTRAVENOUS
  Filled 2016-04-24: qty 1992

## 2016-04-24 MED ORDER — CLONAZEPAM 0.5 MG PO TABS
0.5000 mg | ORAL_TABLET | Freq: Two times a day (BID) | ORAL | Status: DC
  Administered 2016-04-24 – 2016-04-26 (×6): 0.5 mg
  Filled 2016-04-24 (×6): qty 1

## 2016-04-24 NOTE — Progress Notes (Addendum)
[facility] Attending Note  Presenting HPI:  66 y.o. 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 Presented on 2/20 for reversal of colostomy , developed recurrent abdominal pain on 2/26 and taken back to the OR on 2/27 finding free air with peritonitis and anastomotic leakage. Patient returned from the operating room on ventilator and with ongoing shock.  IMAGING/STUDIES: TTE 2/24: LV normal in size with moderate LVH. EF 55-60% with no regional wall motion abnormalities.    MICROBIOLOGY: Blood Cultures x2 2/27 >>>ng Tracheal Aspirate Culture 2/28 >>> nml flora MRSA PCR 2/28:  Negative  Blood Cultures x2 3/2 >>> Urine Culture 3/2 >>>ng  ANTIBIOTICS: Cefotan 2/20 (periop prophylaxis) Zosyn 2/27 >>> Eraxis 2/28 >>>  SIGNIFICANT EVENTS: 02/20 - Admit for colostomy reversal 02/23 - Postop ileus & tachycardia 02/24 - Cardiology Consulted 02/27 - OR for Exploratory Laparotomy finding anastomotic leak & peritonitis  3/2   transitioned to Precedex infusion and  fentanyl drip, febrile  Subjective:  Low gr febrile  Remains Critically ill, intubated, non sp agitation per RN when sedation lowered Good UO TFs started    Vent Mode: PRVC FiO2 (%):  [30 %-40 %] 30 % Set Rate:  [14 bmp] 14 bmp Vt Set:  [580 mL] 580 mL PEEP:  [8 cmH20] 8 cmH20 Plateau Pressure:  [21 cmH20-36 cmH20] 28 cmH20  Temp:  [98.5 F (36.9 C)-100.3 F (37.9 C)] 98.5 F (36.9 C) (03/04 0040) Pulse Rate:  [72-127] 95 (03/04 0721) Resp:  [19-23] 23 (03/04 0721) BP: (100-146)/(58-101) 146/101 (03/04 0721) SpO2:  [92 %-99 %] 98 % (03/04 0721) FiO2 (%):  [30 %-40 %] 30 % (03/04 0721)  Gen.:  Morbidly obese. Acutely ill  No distress Skin: Warm and dry. No rash on exposed skin. Abdominal dressing remains clean and dry. Genitourinary: Foley catheter in place.no bleeding Pulmonary: Distant breath sounds. Symmetric chest wall rise on  ventilator. Cardiovascular: Regular rhythm. No edema. Unable to appreciate JVD given body habitus. Neurological: Sedated. Not following commands. Pupils symmetric and reactive. Abd-distended , ostomy pink, dark output  LINES/TUBES: OETT 7.5 2/27 >>> R IJ CVL 2/27 >>> LUE DL PICC 2/26 >>> Foley >>> L NGT 2/27 >>> L RADIAL ART LINE 2/27 >>> PIV  CBC Latest Ref Rng & Units 04/24/2016 04/23/2016 04/22/2016  WBC 4.0 - 10.5 K/uL 8.8 6.2 6.8  Hemoglobin 13.0 - 17.0 g/dL 9.4(L) 8.9(L) 9.1(L)  Hematocrit 39.0 - 52.0 % 31.9(L) 30.1(L) 30.1(L)  Platelets 150 - 400 K/uL 165 132(L) 130(L)   BMP Latest Ref Rng & Units 04/24/2016 04/23/2016 04/22/2016  Glucose 65 - 99 mg/dL 152(H) 172(H) 164(H)  BUN 6 - 20 mg/dL 27(H) 31(H) 34(H)  Creatinine 0.61 - 1.24 mg/dL 2.50(H) 2.88(H) 3.29(H)  Sodium 135 - 145 mmol/L 145 144 144  Potassium 3.5 - 5.1 mmol/L 4.4 4.3 4.3  Chloride 101 - 111 mmol/L 113(H) 111 111  CO2 22 - 32 mmol/L _0 Calcium 8.9 - 10.3 mg/dL 8.4(L) 8.4(L) 8.1(L)   Hepatic Function Latest Ref Rng & Units 04/23/2016 04/22/2016 04/21/2016  Total Protein 6.5 - 8.1 g/dL - 5.1(L) 4.9(L)  Albumin 3.5 - 5.0 g/dL 1.6(L) 1.7(L) 2.0(L)  AST 15 - 41 U/L - 21 17  ALT 17 - 63 U/L - 21 21  Alk Phosphatase 38 - 126 U/L - 60 50  Total Bilirubin 0.3 - 1.2 mg/dL - 1.0 1.0  Bilirubin, Direct <=0.2 mg/dL - - -  ASSESSMENT/PLAN:  66 y.o. male with peritonitis from bowel anastomotic leak. Off pressors & improving renal function  1. Septic shock:  Resolved, secondary to peritonitis.  Continuing broad-spectrum coverage with Zosyn and Eraxis. FU rpt cultures 3/2 2. Acute on chronic hypoxic and hypercarbic respiratory failure: Known underlying OSA/OHS.Vent settings reviewed &adjusted Ct SBT attempts , keep PEEP 8 for atelectasis 3.  Agitation/Acute encephalopathy/delirium is the main barrier to his extubation- Continuing Precedex infusion and fentanyl drip for pain relief. Start clonazepam 0.5 bid, cannot use  seroquel/ risperdal due to h/o torsades 4. Acute on chronic stage III renal failure: baseline 1.7, cr trending back down  Check  Electrolytes daily, monitor urine output. Hold lasix 5. Hyperglycemia: Mild. No history of diabetes mellitus. Monitoring serum glucose on daily labs.  6. History of coronary artery disease: ASA daily 7. History of essential hypertension: resume BP meds if rises -metoprolol, verapamil  Prophylaxis:  Lovenox subcutaneous daily, Protonix  & SCDs. Diet:  Tolerating trickle TFs , on TPN. Code Status:  Full code  Disposition:  Remains intubated & critically ill in the ICU. Family Update:  No family at bedside  this morning.  Summary - Shock/AKI due to peritonitis resolving, hopeful to extubate him once mental status adequately controlled  My cc time x 89m RKara MeadMD. FShade [facility] Pager [phone] no response call [phone]    7:55 AM 04/24/16

## 2016-04-24 NOTE — Progress Notes (Signed)
5 Days Post-Op  Subjective: No acute changes  Objective: Vital signs in last 24 hours: Temp:  [98.5 F (36.9 C)-100.3 F (37.9 C)] 98.9 F (37.2 C) (03/04 0844) Pulse Rate:  [72-127] 93 (03/04 0800) Resp:  [19-23] 23 (03/04 0721) BP: (100-146)/(58-101) 115/89 (03/04 0800) SpO2:  [92 %-99 %] 98 % (03/04 0800) FiO2 (%):  [30 %] 30 % (03/04 0800) Last BM Date: 04/20/16  Intake/Output from previous day: 03/03 0701 - 03/04 0700 In: 5762.5 [I.V.:4861.5; NG/GT:701; IV Piggyback:200] Out: 2390 [Urine:2225; Emesis/NG output:50; Drains:35; Stool:80] Intake/Output this shift: Total I/O In: 430.9 [I.V.:360.9; NG/GT:70] Out: 0    Intubated/sedated, on PSV Coarse breath sound bilaterally Abdomen distended, obese. JP with scant ss drainage. RLQ incision intact without sign of infection. RLQ ileostomy hyperemic/edematous but viable, gas and thin brown fluid in bag. Midline wound with wet to dry packing, granulating around edges but some tenuous but intact fascia centrally.   Lab Results:   Recent Labs  04/23/16 0450 04/24/16 0251  WBC 6.2 8.8  HGB 8.9* 9.4*  HCT 30.1* 31.9*  PLT 132* 165   BMET  Recent Labs  04/23/16 0450 04/24/16 0251  NA 144 145  K 4.3 4.4  CL 111 113*  CO2 26 27  GLUCOSE 172* 152*  BUN 31* 27*  CREATININE 2.88* 2.50*  CALCIUM 8.4* 8.4*   PT/INR No results for input(s): LABPROT, INR in the last 72 hours. ABG No results for input(s): PHART, HCO3 in the last 72 hours.  Invalid input(s): PCO2, PO2  Studies/Results: Dg Chest Port 1 View  Result Date: 04/22/2016 CLINICAL DATA:  Acute respiratory failure with hypoxia. EXAM: PORTABLE CHEST 1 VIEW COMPARISON:  Radiograph of April 20, 2016. FINDINGS: Endotracheal and nasogastric tubes are in grossly good position. Left-sided PICC line is unchanged with distal tip in expected position of the SVC. Right internal jugular catheter is noted with distal tip in expected position of right innominate vein. No  pneumothorax is noted. Stable cardiomediastinal silhouette is noted. Left lung is clear. Stable right basilar opacity is noted concerning for atelectasis or infiltrate. Bony thorax is unremarkable. IMPRESSION: Stable support apparatus. Stable right basilar opacity as described above. Electronically Signed   By: Marijo Conception, M.D.   On: 04/22/2016 12:35    Anti-infectives: Anti-infectives    Start     Dose/Rate Route Frequency Ordered Stop   04/21/16 1000  anidulafungin (ERAXIS) 100 mg in sodium chloride 0.9 % 100 mL IVPB     100 mg over 90 Minutes Intravenous Every 24 hours 04/20/16 1008     04/20/16 1045  anidulafungin (ERAXIS) 200 mg in sodium chloride 0.9 % 200 mL IVPB     200 mg over 180 Minutes Intravenous  Once 04/20/16 1008 04/20/16 1422   04/19/16 2030  piperacillin-tazobactam (ZOSYN) IVPB 3.375 g     3.375 g 12.5 mL/hr over 240 Minutes Intravenous Every 8 hours 04/19/16 1958     04/19/16 1230  cefoTEtan in Dextrose 5% (CEFOTAN) 2-2.08 GM-% IVPB    Comments:  Ivar Drape   : cabinet override      04/19/16 1230 04/19/16 1245   04/12/16 2000  cefoTEtan (CEFOTAN) 2 g in dextrose 5 % 50 mL IVPB     2 g 100 mL/hr over 30 Minutes Intravenous Every 12 hours 04/12/16 1120 04/12/16 2125   04/12/16 0700  cefoTEtan (CEFOTAN) 2 g in dextrose 5 % 50 mL IVPB     2 g 100 mL/hr over 30 Minutes Intravenous To  [facility] 04/11/16 1404 04/12/16 0753   04/12/16 0548  cefoTEtan in Dextrose 5% (CEFOTAN) 2-2.08 GM-% IVPB    Comments:  Precious Haws   : cabinet override      04/12/16 0548 04/12/16 1759   04/12/16 0544  neomycin (MYCIFRADIN) tablet 1,000 mg  Status:  Discontinued     1,000 mg Oral 3 times per day 04/12/16 0544 04/12/16 0547   04/12/16 0544  metroNIDAZOLE (FLAGYL) tablet 1,000 mg  Status:  Discontinued     1,000 mg Oral 3 times per day 04/12/16 0544 04/12/16 0547      Assessment/Plan: POD 14/5 colostomy takedown complicated by leak, reoperation with ileostomy  1.  Appreciate ccm assistance 2. Continue mech ventilation today due to mentals status, ongoing sirs 3. Continue trickle feeds until ostomy output more convincing 4. Tid dressing changes need to follow this closely for dehiscence/fascia not healthy 5. Renal function improving 6 scds, sqh   Clovis Riley 04/24/2016

## 2016-04-24 NOTE — Progress Notes (Signed)
PHARMACY - ADULT TOTAL PARENTERAL NUTRITION CONSULT NOTE   Pharmacy Consult:  TPN Indication:  Prolonged ileus  Patient Measurements: Height: 5' 10.5" (179.1 cm) Weight: (!) 404 lb (183.3 kg) IBW/kg (Calculated) : 74.15 TPN AdjBW (KG): 101.4 Body mass index is 57.15 kg/m.  Assessment:  77 YOM with history of an obstructing tumor s/p emergent colectomy with colostomy of right transverse colon in 2014.  Patient presented on 04/12/16 for reversal of colostomy.  Postoperatively, he had emesis on 04/14/16 and a distended abdomen on 04/15/16.  He has consumed </= 10% of his meals for 7 days and Pharmacy consulted to manage TPN for prolonged ileus. Patient reports eating regularly and gained 3-4 lbs before admission.  GI: Morbidly obese. S/p ex-lap and repair of leak on 2/27. NG tube output yesterday was down to 39ml, has significant improved. Last BM 2/28. Surgery continuing trickle feeds. Albumin down to 1.6. Prealbumin wnl at 21. Simethicone, PPI IV.  Endo: No hx DM, A1c 5.5% - CBGs mostly controlled but trending up (120-160s) after starting TFs Insulin requirements in the past 24 hours: 5 units of SSI Lytes: WNL. K 4.4 (goal > 4 for ileus), CoCa 10.3, Mg 2.3 (goal > 2 for ileus), and Phos 2.7. Renal: CKD3. SCr back down to 2.5 (peaked at 3.36), BUN stable at 27, CrCl ~44ml/min. UOP ok at 0.51ml/kg/hr. Flomax, bethanechol, NS KVO.  Pulm: Intubated on 2/27. Remains intubated with FiO2 of 30% Cards: CM / CHF (EF 55-60%) / CAD post PCI / HLD / HTN / PAD / hx atrial tachycardia and torsades. BP elevated and HR tachy. metoprolol, ASA PR Hepatobil: LFTS wnl, tbili 1, TG down to 117. Neuro: GCS 11, RASS 0 (goal -2), CPOT 0. Propofol off. Continues on fentanyl gtt (369mcg/hr) and precedex (1.2 mcg/kg/hr).  ID: Cotinues on Zosyn and Eraxis for sepsis / peritonitis. Fever improving with Tmax of 100.3 yesterday, WBCs wnl. Cx's remain ngtd. PCT trending down Heme/Onc: Hx colon cancer. Hgb trending down to 9.4,  plts wnl Best Practices: Lovenox TPN Access: PICC placed 04/18/16 TPN start date: 04/18/16  Nutritional Goals: per RD recs on 3/2 KCal: 1850-2400 Protein: >/= 187 g  Current Nutrition:  Clinimix E 5/15 TF Pivot 1.5 at 46mL/hr (720 kCal and 45gm per day)  Plan:  Continue Clinimix E 5/15 at 51ml/hr Hold IV lipid emulsions while transitioning to enteral feeds and while in the ICU per ASPEN/SCCM guidelines TPN + TF provides 145 g of protein and 2134 kCals per day meeting 78% of protein and 100% of kCal needs Due to Clinimix shortage and transition to TF, will not increase TPN further Add MVI in TPN Add TE in TPN every other day, next dose 3/4 Continue sensitive SSI Q6h and adjust as needed Monitor TPN labs tomorrow F/U advancement of TF rate, extubation when able  Elenor Quinones, PharmD, BCPS Clinical Pharmacist Pager [phone] 04/24/2016 7:34 AM

## 2016-04-25 DIAGNOSIS — J9600 Acute respiratory failure, unspecified whether with hypoxia or hypercapnia: Secondary | ICD-10-CM

## 2016-04-25 DIAGNOSIS — K560 Paralytic ileus: Secondary | ICD-10-CM

## 2016-04-25 DIAGNOSIS — K659 Peritonitis, unspecified: Secondary | ICD-10-CM

## 2016-04-25 DIAGNOSIS — L8990 Pressure ulcer of unspecified site, unspecified stage: Secondary | ICD-10-CM | POA: Insufficient documentation

## 2016-04-25 LAB — GLUCOSE, CAPILLARY
GLUCOSE-CAPILLARY: 152 mg/dL — AB (ref 65–99)
GLUCOSE-CAPILLARY: 135 mg/dL — AB (ref 65–99)
Glucose-Capillary: 140 mg/dL — ABNORMAL HIGH (ref 65–99)
Glucose-Capillary: 137 mg/dL — ABNORMAL HIGH (ref 65–99)

## 2016-04-25 LAB — COMPREHENSIVE METABOLIC PANEL
ALBUMIN: 1.6 g/dL — AB (ref 3.5–5.0)
ALK PHOS: 74 U/L (ref 38–126)
ALT: 28 U/L (ref 17–63)
AST: 25 U/L (ref 15–41)
Anion gap: 3 — ABNORMAL LOW (ref 5–15)
BUN: 25 mg/dL — ABNORMAL HIGH (ref 6–20)
CHLORIDE: 114 mmol/L — AB (ref 101–111)
CO2: 27 mmol/L (ref 22–32)
Calcium: 8.3 mg/dL — ABNORMAL LOW (ref 8.9–10.3)
Creatinine, Ser: 2.28 mg/dL — ABNORMAL HIGH (ref 0.61–1.24)
GFR calc non Af Amer: 29 mL/min — ABNORMAL LOW (ref >60)
GFR, EST AFRICAN AMERICAN: 33 mL/min — AB (ref >60)
GLUCOSE: 150 mg/dL — AB (ref 65–99)
Potassium: 4.5 mmol/L (ref 3.5–5.1)
SODIUM: 144 mmol/L (ref 135–145)
Total Bilirubin: 0.4 mg/dL (ref 0.3–1.2)
Total Protein: 5.4 g/dL — ABNORMAL LOW (ref 6.5–8.1)

## 2016-04-25 LAB — DIFFERENTIAL
Basophils Absolute: 0 10*3/uL (ref 0.0–0.1)
Basophils Relative: 0 %
EOS PCT: 3 %
Eosinophils Absolute: 0.2 10*3/uL (ref 0.0–0.7)
LYMPHS ABS: 0.8 10*3/uL (ref 0.7–4.0)
LYMPHS PCT: 11 %
MONO ABS: 1.4 10*3/uL — AB (ref 0.1–1.0)
MONOS PCT: 19 %
NEUTROS ABS: 5 10*3/uL (ref 1.7–7.7)
Neutrophils Relative %: 67 %

## 2016-04-25 LAB — CBC
HEMATOCRIT: 30.5 % — AB (ref 39.0–52.0)
HEMOGLOBIN: 8.8 g/dL — AB (ref 13.0–17.0)
MCH: 26.3 pg (ref 26.0–34.0)
MCHC: 28.9 g/dL — ABNORMAL LOW (ref 30.0–36.0)
MCV: 91.3 fL (ref 78.0–100.0)
Platelets: 194 10*3/uL (ref 150–400)
RBC: 3.34 MIL/uL — AB (ref 4.22–5.81)
RDW: 15.6 % — ABNORMAL HIGH (ref 11.5–15.5)
WBC: 7.5 10*3/uL (ref 4.0–10.5)

## 2016-04-25 LAB — CULTURE, BLOOD (ROUTINE X 2)
CULTURE: NO GROWTH
CULTURE: NO GROWTH

## 2016-04-25 LAB — MAGNESIUM: Magnesium: 2.1 mg/dL (ref 1.7–2.4)

## 2016-04-25 LAB — PHOSPHORUS: Phosphorus: 2.5 mg/dL (ref 2.5–4.6)

## 2016-04-25 LAB — TRIGLYCERIDES: Triglycerides: 183 mg/dL — ABNORMAL HIGH (ref <150)

## 2016-04-25 MED ORDER — M.V.I. ADULT IV INJ
INTRAVENOUS | Status: AC
  Administered 2016-04-25: 18:00:00 via INTRAVENOUS
  Filled 2016-04-25: qty 1992

## 2016-04-25 MED ORDER — POTASSIUM PHOSPHATES 15 MMOLE/5ML IV SOLN
10.0000 mmol | Freq: Once | INTRAVENOUS | Status: AC
  Administered 2016-04-25: 10 mmol via INTRAVENOUS
  Filled 2016-04-25: qty 3.33

## 2016-04-25 NOTE — Progress Notes (Signed)
GS Progress Note Subjective: Patient weaning on the ventilator.  Still sedated.  Weaned for 10 hours yesterday.  Objective: Vital signs in last 24 hours: Temp:  [98.4 F (36.9 C)-99.7 F (37.6 C)] 98.9 F (37.2 C) (03/05 0400) Pulse Rate:  [75-129] 92 (03/05 0728) Resp:  [13-25] 19 (03/05 0728) BP: (103-157)/(63-113) 114/78 (03/05 0728) SpO2:  [95 %-100 %] 98 % (03/05 0728) FiO2 (%):  [30 %-40 %] 30 % (03/05 0728) Last BM Date: 04/20/16  Intake/Output from previous day: 03/04 0701 - 03/05 0700 In: 5075.3 [I.V.:4115.3; NG/GT:760; IV Piggyback:200] Out: 2425 [Urine:2425] Intake/Output this shift: No intake/output data recorded.  Lungs: Clear.  Abd: Distended, tense, minmal output from ileostomy.  Tolerating tube feedings.  Extremities: No distress.  Neuro: Sedated  Lab Results: CBC   Recent Labs  04/24/16 0251 04/25/16 0415  WBC 8.8 7.5  HGB 9.4* 8.8*  HCT 31.9* 30.5*  PLT 165 194   BMET  Recent Labs  04/24/16 0251 04/25/16 0415  NA 145 144  K 4.4 4.5  CL 113* 114*  CO2 27 27  GLUCOSE 152* 150*  BUN 27* 25*  CREATININE 2.50* 2.28*  CALCIUM 8.4* 8.3*   PT/INR No results for input(s): LABPROT, INR in the last 72 hours. ABG No results for input(s): PHART, HCO3 in the last 72 hours.  Invalid input(s): PCO2, PO2  Studies/Results: Dg Chest Port 1 View  Result Date: 04/24/2016 CLINICAL DATA:  Acute respiratory failure. EXAM: PORTABLE CHEST 1 VIEW COMPARISON:  04/22/2016 FINDINGS: Endotracheal tube terminates just inferior to the clavicular heads, unchanged. Enteric tube courses into the left upper abdomen with tip not imaged. Left PICC terminates over the SVC. Cardiomediastinal silhouette is unchanged. There are mild bibasilar lung opacities which have not significantly changed. No sizable pleural effusion or pneumothorax is identified. IMPRESSION: Unchanged mild bibasilar opacities which may reflect atelectasis. Electronically Signed   By: Logan Bores M.D.    On: 04/24/2016 09:12    Anti-infectives: Anti-infectives    Start     Dose/Rate Route Frequency Ordered Stop   04/21/16 1000  anidulafungin (ERAXIS) 100 mg in sodium chloride 0.9 % 100 mL IVPB     100 mg over 90 Minutes Intravenous Every 24 hours 04/20/16 1008     04/20/16 1045  anidulafungin (ERAXIS) 200 mg in sodium chloride 0.9 % 200 mL IVPB     200 mg over 180 Minutes Intravenous  Once 04/20/16 1008 04/20/16 1422   04/19/16 2030  piperacillin-tazobactam (ZOSYN) IVPB 3.375 g     3.375 g 12.5 mL/hr over 240 Minutes Intravenous Every 8 hours 04/19/16 1958     04/19/16 1230  cefoTEtan in Dextrose 5% (CEFOTAN) 2-2.08 GM-% IVPB    Comments:  Ivar Drape   : cabinet override      04/19/16 1230 04/19/16 1245   04/12/16 2000  cefoTEtan (CEFOTAN) 2 g in dextrose 5 % 50 mL IVPB     2 g 100 mL/hr over 30 Minutes Intravenous Every 12 hours 04/12/16 1120 04/12/16 2125   04/12/16 0700  cefoTEtan (CEFOTAN) 2 g in dextrose 5 % 50 mL IVPB     2 g 100 mL/hr over 30 Minutes Intravenous To [facility] 04/11/16 1404 04/12/16 0753   04/12/16 0548  cefoTEtan in Dextrose 5% (CEFOTAN) 2-2.08 GM-% IVPB    Comments:  Precious Haws   : cabinet override      04/12/16 0548 04/12/16 1759   04/12/16 0544  neomycin (MYCIFRADIN) tablet 1,000 mg  Status:  Discontinued  1,000 mg Oral 3 times per day 04/12/16 0544 04/12/16 0547   04/12/16 0544  metroNIDAZOLE (FLAGYL) tablet 1,000 mg  Status:  Discontinued     1,000 mg Oral 3 times per day 04/12/16 0544 04/12/16 0547      Assessment/Plan: s/p Procedure(s): EXPLORATORY LAPAROTOMY, REPAIR OF ANASTAMOTIC LEAK COLOSTOMY Hold tube feedings and place NGT on suction fo one hour.  Continue TPN.   LOS: 13 days    Kathryne Eriksson. Dahlia Bailiff, MD, FACS [phone] [facility] 04/25/2016

## 2016-04-25 NOTE — Progress Notes (Signed)
PHARMACY - ADULT TOTAL PARENTERAL NUTRITION CONSULT NOTE   Pharmacy Consult:  TPN Indication:  Prolonged ileus  Patient Measurements: Height: 5' 10.5" (179.1 cm) Weight: (!) 183.3 kg (404 lb) IBW/kg (Calculated) : 74.15 TPN AdjBW (KG): 101.4 Body mass index is 57.15 kg/m.  Assessment:  67 YOM with history of an obstructing tumor s/p emergent colectomy with colostomy of right transverse colon in 2014.  Patient presented on 04/12/16 for reversal of colostomy.  Postoperatively, he had emesis on 04/14/16 and a distended abdomen on 04/15/16.  He has consumed </= 10% of his meals for 7 days and Pharmacy consulted to manage TPN for prolonged ileus. Patient reports eating regularly and gained 3-4 lbs before admission.  GI: s/p ex-lap and repair of leak on 2/27.  No NG O/P.  Last BM 2/28.  Hold TF and NG to suction for 1 hr, r/o distention. Prealbumin WNL at 21. Simethicone, PPI IV.  Endo: No hx DM, A1c 5.5% - CBGs controlled Insulin requirements in the past 24 hours: 5 units of SSI Lytes: all WNL except elevated CL (Phos low normal) Renal: CKD3 - SCr down 2.28, BUN 25 - good UOP 0.6 ml/kg/hr, NS at [facility]. Flomax, bethanechol. Pulm: intubated 2/27 - FiO2 30%.  Extubated when mentation improves. Cards: CM / CHF (EF 55-60%) / CAD post PCI / HLD / HTN / PAD / hx atrial tachycardia and torsades. BP controlled, tachy - metoprolol, ASA PR Hepatobil: LFTs / tbili WNL.  TG mildly elevated at 183 Neuro: Fentanyl/Precedex, Propofol off 3/1 - GCS 11, CPOT 0-4, RASS 2 (goal -2) ID: Zosyn/Eraxis for sepsis+ peritonitis.  Now afebrile, WBC WNL. Cultures NGTD. Heme/Onc: Hx colon cancer - hgb down 8.8, plts normalized Best Practices: heparin SQ, CHG/mupirocin TPN Access: PICC placed 04/18/16 TPN start date: 04/18/16  Nutritional Goals: per RD recs on 3/2 1850-2400 kCal and >/= 187 gm protein per day  Current Nutrition:  Clinimix E 5/15 Pivot 1.5 at 20 mL/hr (720 kCal and 45gm per day) >> currently off, resume if  no distention   Plan:  - Continue Clinimix E 5/15 at 83 ml/hr.  Hold lipids while on EN and while in the ICU per ASPEN/SCCM guidelines - TPN + TF provide 2134 kCal and 145 g of protein per day, meeting 100% of kCal and 78% of protein needs - Due to Clinimix shortage and transition to TF, will not increase TPN further - Daily multivitamin in TPN - Trace elements every other day, next dose 3/6 - KPhos 10 mmol IV x 1 - Continue sensitive SSI Q6H - F/U TF tolerance/advancement/extubation to wean off of TPN - F/U prealbumin in AM   Lowell Makara D. Mina Marble, PharmD, BCPS Pager:  [phone] 04/25/2016, 10:34 AM

## 2016-04-25 NOTE — Progress Notes (Signed)
[facility] Attending Note  Presenting HPI:  66 y.o. 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 Presented on 2/20 for reversal of colostomy , developed recurrent abdominal pain on 2/26 and taken back to the OR on 2/27 finding free air with peritonitis and anastomotic leakage. Patient returned from the operating room on ventilator and with ongoing shock.  IMAGING/STUDIES: TTE 2/24: LV normal in size with moderate LVH. EF 55-60% with no regional wall motion abnormalities.    MICROBIOLOGY: Blood Cultures x2 2/27 >>>ng Tracheal Aspirate Culture 2/28 >>> nml flora MRSA PCR 2/28:  Negative  Blood Cultures x2 3/2 >>> Urine Culture 3/2 >>>ng  ANTIBIOTICS: Cefotan 2/20 (periop prophylaxis) Zosyn 2/27 >>> Eraxis 2/28 >>>  SIGNIFICANT EVENTS: 02/20 - Admit for colostomy reversal 02/23 - Postop ileus & tachycardia 02/24 - Cardiology Consulted 02/27 - OR for Exploratory Laparotomy finding anastomotic leak & peritonitis  3/2   transitioned to Precedex infusion and  fentanyl drip, febrile  Subjective:  Low gr febrile  Remains Critically ill, intubated, non sp agitation per RN when sedation lowered Good UO TF stopped this am for possible abd distention    Vent Mode: PRVC FiO2 (%):  [30 %-40 %] 30 % Set Rate:  [14 bmp] 14 bmp Vt Set:  [580 mL] 580 mL PEEP:  [5 cmH20-8 cmH20] 8 cmH20 Pressure Support:  [10 cmH20] 10 cmH20 Plateau Pressure:  [29 cmH20-30 cmH20] 29 cmH20  Temp:  [98.1 F (36.7 C)-99.7 F (37.6 C)] 98.1 F (36.7 C) (03/05 0802) Pulse Rate:  [75-129] 123 (03/05 0800) Resp:  [13-25] 19 (03/05 0800) BP: (103-157)/(63-113) 116/92 (03/05 0800) SpO2:  [95 %-100 %] 97 % (03/05 0800) FiO2 (%):  [30 %-40 %] 30 % (03/05 0800)  Gen.:  Morbidly obese. Acutely ill  No distress Skin: Warm and dry. No rash on exposed skin. Abdominal dressing remains clean and dry. Genitourinary: Foley catheter in  place.no bleeding Pulmonary: Distant breath sounds. Symmetric chest wall rise on ventilator. Cardiovascular: Regular rhythm. Gr 2  edema. Unable to appreciate JVD given body habitus. Neurological: Sedated. Not following commands. Pupils symmetric and reactive. Abd-distended , ostomy pink, dec BS. Soft, distended.   LINES/TUBES: OETT 7.5 2/27 >>> R IJ CVL 2/27 >>> LUE DL PICC 2/26 >>> Foley >>> L NGT 2/27 >>> L RADIAL ART LINE 2/27 >>> PIV  CBC Latest Ref Rng & Units 04/25/2016 04/24/2016 04/23/2016  WBC 4.0 - 10.5 K/uL 7.5 8.8 6.2  Hemoglobin 13.0 - 17.0 g/dL 8.8(L) 9.4(L) 8.9(L)  Hematocrit 39.0 - 52.0 % 30.5(L) 31.9(L) 30.1(L)  Platelets 150 - 400 K/uL 194 165 132(L)   BMP Latest Ref Rng & Units 04/25/2016 04/24/2016 04/23/2016  Glucose 65 - 99 mg/dL 150(H) 152(H) 172(H)  BUN 6 - 20 mg/dL 25(H) 27(H) 31(H)  Creatinine 0.61 - 1.24 mg/dL 2.28(H) 2.50(H) 2.88(H)  Sodium 135 - 145 mmol/L 144 145 144  Potassium 3.5 - 5.1 mmol/L 4.5 4.4 4.3  Chloride 101 - 111 mmol/L 114(H) 113(H) 111  CO2 22 - 32 mmol/L 27 27 26   Calcium 8.9 - 10.3 mg/dL 8.3(L) 8.4(L) 8.4(L)   Hepatic Function Latest Ref Rng & Units 04/25/2016 04/23/2016 04/22/2016  Total Protein 6.5 - 8.1 g/dL 5.4(L) - 5.1(L)  Albumin 3.5 - 5.0 g/dL 1.6(L) 1.6(L) 1.7(L)  AST 15 - 41 U/L 25 - 21  ALT 17 - 63 U/L 28 - 21  Alk Phosphatase 38 - 126 U/L 74 - 60  Total Bilirubin  0.3 - 1.2 mg/dL 0.4 - 1.0  Bilirubin, Direct <=0.2 mg/dL - - -      ASSESSMENT/PLAN:  66 y.o. male with peritonitis from bowel anastomotic leak. Off pressors & improving renal function. Agitation is barrier to weaning.  TF stopped on 3/5 for because of concern for abd distention  1. Septic shock:  Resolved, secondary to peritonitis.  Continuing broad-spectrum coverage with Zosyn and Eraxis. Cultures remain (-).  2. Acute on chronic hypoxemic and hypercarbic respiratory failure: Known underlying OSA/OHS. On PST daily but failing last 2-3 days 2/2 agitation.  Clonopin  started on 3/4 but not sure if he is absorbing it 2/2 abd distention.  3. Agitation/Acute encephalopathy/delirium is the main barrier to his extubation- Continuing Precedex infusion and fentanyl drip for pain relief. Cont clonazepam 0.5 bid, cannot use seroquel/ risperdal due to h/o torsades. Reassess agitation in next 24 hrs >> if not better, he is probably not absorbing PO meds so we may need to use IV meds.  4. Acute on chronic stage III renal failure: baseline 1.7, cr trending back down  Check  Electrolytes daily, monitor urine output. Hold lasix, he is auto diurescing.  5. Hyperglycemia: Mild. No history of diabetes mellitus. Monitoring serum glucose on daily labs.  6. History of coronary artery disease: ASA daily 7. History of essential hypertension: resume BP meds if rises -metoprolol, verapamil  Prophylaxis:  Lovenox subcutaneous daily, Protonix  & SCDs. Diet:  On  TPN. TF on hold 3/5.  Code Status:  Full code  Disposition:  Remains intubated & critically ill in the ICU. Family Update:  No family at bedside  this morning.  Summary - Shock/AKI due to peritonitis resolving, hopeful to extubate him once mental status adequately controlled  I spent  30  minutes of Critical Care time with this patient today.   Monica Becton, MD 04/25/2016, 9:32 AM [facility] Pager [phone] After 3 pm or if no answer, call [phone]

## 2016-04-25 NOTE — Consult Note (Addendum)
[facility] Nurse ostomy consult note Surgical team is following for assessment and plan of care to abd wound.  Pt is familiar with ostomy care, since he previously had a colostomy, according to the EMR Stoma type/location: Ileostomy stoma  red and viable, slightly above skin level, 1 1/2  inches Peristomal assessment: Intact skin surrounding Output: Small amt brown liquid stool in the pouch Ostomy pouching: 1pc.  Education provided:  Pt is intubated and sedated and no family members are present.  Will plan to begin teaching sessions when stable and out of ICU. Supplies at the bedside for staff nurse use. Enrolled patient in [place] program: No Julien Girt MSN, [place], [place], [place], [place]

## 2016-04-26 ENCOUNTER — Inpatient Hospital Stay (HOSPITAL_COMMUNITY): Payer: Medicare Other

## 2016-04-26 ENCOUNTER — Ambulatory Visit: Payer: Medicare Other | Admitting: Nurse Practitioner

## 2016-04-26 DIAGNOSIS — R109 Unspecified abdominal pain: Secondary | ICD-10-CM

## 2016-04-26 DIAGNOSIS — E8779 Other fluid overload: Secondary | ICD-10-CM

## 2016-04-26 DIAGNOSIS — E8770 Fluid overload, unspecified: Secondary | ICD-10-CM

## 2016-04-26 DIAGNOSIS — R1084 Generalized abdominal pain: Secondary | ICD-10-CM

## 2016-04-26 LAB — BASIC METABOLIC PANEL
Anion gap: 3 — ABNORMAL LOW (ref 5–15)
BUN: 21 mg/dL — ABNORMAL HIGH (ref 6–20)
CALCIUM: 8.8 mg/dL — AB (ref 8.9–10.3)
CO2: 27 mmol/L (ref 22–32)
Chloride: 114 mmol/L — ABNORMAL HIGH (ref 101–111)
Creatinine, Ser: 1.93 mg/dL — ABNORMAL HIGH (ref 0.61–1.24)
GFR, EST AFRICAN AMERICAN: 41 mL/min — AB (ref >60)
GFR, EST NON AFRICAN AMERICAN: 35 mL/min — AB (ref >60)
Glucose, Bld: 154 mg/dL — ABNORMAL HIGH (ref 65–99)
POTASSIUM: 4.4 mmol/L (ref 3.5–5.1)
SODIUM: 144 mmol/L (ref 135–145)

## 2016-04-26 LAB — CBC
HCT: 29.6 % — ABNORMAL LOW (ref 39.0–52.0)
Hemoglobin: 8.7 g/dL — ABNORMAL LOW (ref 13.0–17.0)
MCH: 26.8 pg (ref 26.0–34.0)
MCHC: 29.4 g/dL — ABNORMAL LOW (ref 30.0–36.0)
MCV: 91.1 fL (ref 78.0–100.0)
PLATELETS: 210 10*3/uL (ref 150–400)
RBC: 3.25 MIL/uL — AB (ref 4.22–5.81)
RDW: 15.4 % (ref 11.5–15.5)
WBC: 8.6 10*3/uL (ref 4.0–10.5)

## 2016-04-26 LAB — GLUCOSE, CAPILLARY
GLUCOSE-CAPILLARY: 135 mg/dL — AB (ref 65–99)
GLUCOSE-CAPILLARY: 141 mg/dL — AB (ref 65–99)
GLUCOSE-CAPILLARY: 147 mg/dL — AB (ref 65–99)
Glucose-Capillary: 131 mg/dL — ABNORMAL HIGH (ref 65–99)
Glucose-Capillary: 131 mg/dL — ABNORMAL HIGH (ref 65–99)
Glucose-Capillary: 138 mg/dL — ABNORMAL HIGH (ref 65–99)

## 2016-04-26 LAB — PREALBUMIN: PREALBUMIN: 10.4 mg/dL — AB (ref 18–38)

## 2016-04-26 LAB — PHOSPHORUS: PHOSPHORUS: 2.5 mg/dL (ref 2.5–4.6)

## 2016-04-26 LAB — MAGNESIUM: MAGNESIUM: 2 mg/dL (ref 1.7–2.4)

## 2016-04-26 MED ORDER — TRACE MINERALS CR-CU-MN-SE-ZN 10-1000-500-60 MCG/ML IV SOLN
INTRAVENOUS | Status: AC
  Administered 2016-04-26: 18:00:00 via INTRAVENOUS
  Filled 2016-04-26: qty 2400

## 2016-04-26 MED ORDER — TRACE MINERALS CR-CU-MN-SE-ZN 10-1000-500-60 MCG/ML IV SOLN
INTRAVENOUS | Status: DC
  Filled 2016-04-26: qty 2400

## 2016-04-26 MED ORDER — POTASSIUM PHOSPHATES 15 MMOLE/5ML IV SOLN
15.0000 mmol | Freq: Once | INTRAVENOUS | Status: AC
  Administered 2016-04-26: 15 mmol via INTRAVENOUS
  Filled 2016-04-26: qty 5

## 2016-04-26 NOTE — Progress Notes (Signed)
[facility] Attending Note  Presenting HPI:  66 y.o. 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 Presented on 2/20 for reversal of colostomy , developed recurrent abdominal pain on 2/26 and taken back to the OR on 2/27 finding free air with peritonitis and anastomotic leakage. Patient returned from the operating room on ventilator and with ongoing shock.  IMAGING/STUDIES: TTE 2/24: LV normal in size with moderate LVH. EF 55-60% with no regional wall motion abnormalities.    MICROBIOLOGY: Blood Cultures x2 2/27 >>>ng Tracheal Aspirate Culture 2/28 >>> nml flora MRSA PCR 2/28:  Negative  Blood Cultures x2 3/2 >>> Urine Culture 3/2 >>>ng  ANTIBIOTICS: Cefotan 2/20 (periop prophylaxis) Zosyn 2/27 >>> Eraxis 2/28 >>>  SIGNIFICANT EVENTS: 02/20 - Admit for colostomy reversal 02/23 - Postop ileus & tachycardia 02/24 - Cardiology Consulted 02/27 - OR for Exploratory Laparotomy finding anastomotic leak & peritonitis  3/2   transitioned to Precedex infusion and  fentanyl drip, febrile  Subjective:  Low gr febrile  Abdomen noted to be more distended this am.  TF stopped on 3/5.  Did poor on PST >> was tachypneic and tachycardic.    Vent Mode: PRVC FiO2 (%):  [30 %-40 %] 40 % Set Rate:  [14 bmp] 14 bmp Vt Set:  [580 mL] 580 mL PEEP:  [8 cmH20] 8 cmH20 Plateau Pressure:  [25 AVW09-81 cmH20] 31 cmH20  Temp:  [97.9 F (36.6 C)-99 F (37.2 C)] 98 F (36.7 C) (03/06 0804) Pulse Rate:  [67-140] 85 (03/06 0900) Resp:  [6-27] 17 (03/06 0900) BP: (100-173)/(62-119) 110/74 (03/06 0900) SpO2:  [92 %-100 %] 98 % (03/06 0900) FiO2 (%):  [30 %-40 %] 40 % (03/06 0750) Weight:  [177.4 kg (391 lb)] 177.4 kg (391 lb) (03/06 0500)  Gen.:  Morbidly obese. Acutely ill  No distress Skin: Warm and dry. No rash on exposed skin. Abdominal dressing remains clean and dry. Genitourinary: Foley catheter in place.no  bleeding Pulmonary: Distant breath sounds. Symmetric chest wall rise on ventilator. Cardiovascular: Regular rhythm. Gr 2  edema. Unable to appreciate JVD given body habitus. Neurological: Sedated. Not following commands. Pupils symmetric and reactive. Abd-distended , ostomy pink/ ?dusky, dec BS. Soft, more distended.   LINES/TUBES: OETT 7.5 2/27 >>> R IJ CVL 2/27 >>> LUE DL PICC 2/26 >>> Foley >>> L NGT 2/27 >>> L RADIAL ART LINE 2/27 >>> PIV  CBC Latest Ref Rng & Units 04/26/2016 04/25/2016 04/24/2016  WBC 4.0 - 10.5 K/uL 8.6 7.5 8.8  Hemoglobin 13.0 - 17.0 g/dL 8.7(L) 8.8(L) 9.4(L)  Hematocrit 39.0 - 52.0 % 29.6(L) 30.5(L) 31.9(L)  Platelets 150 - 400 K/uL 210 194 165   BMP Latest Ref Rng & Units 04/26/2016 04/25/2016 04/24/2016  Glucose 65 - 99 mg/dL 154(H) 150(H) 152(H)  BUN 6 - 20 mg/dL 21(H) 25(H) 27(H)  Creatinine 0.61 - 1.24 mg/dL 1.93(H) 2.28(H) 2.50(H)  Sodium 135 - 145 mmol/L 144 144 145  Potassium 3.5 - 5.1 mmol/L 4.4 4.5 4.4  Chloride 101 - 111 mmol/L 114(H) 114(H) 113(H)  CO2 22 - 32 mmol/L _0 Calcium 8.9 - 10.3 mg/dL 8.8(L) 8.3(L) 8.4(L)   Hepatic Function Latest Ref Rng & Units 04/25/2016 04/23/2016 04/22/2016  Total Protein 6.5 - 8.1 g/dL 5.4(L) - 5.1(L)  Albumin 3.5 - 5.0 g/dL 1.6(L) 1.6(L) 1.7(L)  AST 15 - 41 U/L 25 - 21  ALT 17 - 63 U/L 28 - 21  Alk Phosphatase 38 - 126  U/L 74 - 60  Total Bilirubin 0.3 - 1.2 mg/dL 0.4 - 1.0  Bilirubin, Direct <=0.2 mg/dL - - -      ASSESSMENT/PLAN:  66 y.o. male with peritonitis from bowel anastomotic leak. Off pressors & improving renal function..  TF stopped on 3/5 for because of concern for abd distention. More abdominal distention on 3/6.  Doing poor on PST.   1. S/P Septic shock secondary to peritonitis.  Continuing broad-spectrum coverage with Zosyn and Eraxis. Cultures remain (-). Abdomen looks more distended on 3/6.  TF held on 3/5.  Await what surgery thinks. Not sure if pt will go back to OR.  2. Acute on chronic  hypoxemic and hypercarbic respiratory failure: Known underlying OSA/OHS. On PST daily but failing last 2-3 days 2/2 agitation.  Clonopin started on 3/4 but not sure if he is absorbing it 2/2 abd distention. Observe for now. Daily PST.  Issues today with weaning is abd distention.  3. Agitation/Acute encephalopathy/delirium - Continuing Precedex infusion and fentanyl drip for pain relief. Cont clonazepam 0.5 bid, cannot use seroquel/ risperdal due to h/o torsades. Reassess agitation in next 24 hrs >> if not better, he is probably not absorbing PO meds so we may need to use IV meds.  4. Acute on chronic stage III renal failure: baseline 1.7, cr trending back down  Check  Electrolytes daily, monitor urine output. Hold lasix, he is auto diurescing.  5. Hyperglycemia: Mild. No history of diabetes mellitus. Monitoring serum glucose on daily labs.  6. History of coronary artery disease: ASA daily 7. History of essential hypertension: resume BP meds if rises -metoprolol, verapamil  Prophylaxis:  Lovenox subcutaneous daily, Protonix  & SCDs. Diet:  On  TPN. TF on hold 3/5.  Code Status:  Full code  Disposition:  Remains intubated & critically ill in the ICU. Family Update:  No family at bedside  this morning.  Summary - Shock/AKI due to peritonitis resolving, hopeful to extubate him once mental status adequately controlled. Last 2 days however, the abdomen is the issues with more distention.   I spent  30  minutes of Critical Care time with this patient today.   Monica Becton, MD 04/26/2016, 10:09 AM [facility] Pager [phone] After 3 pm or if no answer, call [phone]

## 2016-04-26 NOTE — Progress Notes (Signed)
[facility] Progress Note Subjective: I agree that the patient's abdomen is more distended today that yesterday.  His NGT is on suction.  850cc out yesterday.  Ileostomy is not producing any enteric drainage.    Objective: Vital signs in last 24 hours: Temp:  [97.9 F (36.6 C)-99 F (37.2 C)] 98.2 F (36.8 C) (03/06 1130) Pulse Rate:  [67-140] 98 (03/06 1155) Resp:  [6-26] 25 (03/06 1155) BP: (100-173)/(57-119) 100/57 (03/06 1130) SpO2:  [92 %-100 %] 97 % (03/06 1155) FiO2 (%):  [30 %-40 %] 40 % (03/06 1155) Weight:  [177.4 kg (391 lb)] 177.4 kg (391 lb) (03/06 0500) Last BM Date: 04/20/16  Intake/Output from previous day: 03/05 0701 - 03/06 0700 In: 4783.9 [I.V.:3980.6; NG/GT:270; IV Piggyback:533.3] Out: 3710 [Urine:2800; Emesis/NG output:850; Drains:10; Stool:50] Intake/Output this shift: Total I/O In: 565.8 [I.V.:435.8; IV Piggyback:130] Out: 375 [Urine:375]  Lungs: Clear.  PEEP up to +8.  Sats are okay.  Abd: Distended with hypoactive bowel sounds. Seems more tender  Extremities: Edematous and not tender  Neuro: Sedated  Lab Results: CBC   Recent Labs  04/25/16 0415 04/26/16 0300  WBC 7.5 8.6  HGB 8.8* 8.7*  HCT 30.5* 29.6*  PLT 194 210   BMET  Recent Labs  04/25/16 0415 04/26/16 0300  NA 144 144  K 4.5 4.4  CL 114* 114*  CO2 27 27  GLUCOSE 150* 154*  BUN 25* 21*  CREATININE 2.28* 1.93*  CALCIUM 8.3* 8.8*   PT/INR No results for input(s): LABPROT, INR in the last 72 hours. ABG No results for input(s): PHART, HCO3 in the last 72 hours.  Invalid input(s): PCO2, PO2  Studies/Results: No results found.  Anti-infectives: Anti-infectives    Start     Dose/Rate Route Frequency Ordered Stop   04/21/16 1000  anidulafungin (ERAXIS) 100 mg in sodium chloride 0.9 % 100 mL IVPB     100 mg over 90 Minutes Intravenous Every 24 hours 04/20/16 1008     04/20/16 1045  anidulafungin (ERAXIS) 200 mg in sodium chloride 0.9 % 200 mL IVPB     200 mg over 180  Minutes Intravenous  Once 04/20/16 1008 04/20/16 1422   04/19/16 2030  piperacillin-tazobactam (ZOSYN) IVPB 3.375 g     3.375 g 12.5 mL/hr over 240 Minutes Intravenous Every 8 hours 04/19/16 1958     04/19/16 1230  cefoTEtan in Dextrose 5% (CEFOTAN) 2-2.08 GM-% IVPB    Comments:  Ivar Drape   : cabinet override      04/19/16 1230 04/19/16 1245   04/12/16 2000  cefoTEtan (CEFOTAN) 2 g in dextrose 5 % 50 mL IVPB     2 g 100 mL/hr over 30 Minutes Intravenous Every 12 hours 04/12/16 1120 04/12/16 2125   04/12/16 0700  cefoTEtan (CEFOTAN) 2 g in dextrose 5 % 50 mL IVPB     2 g 100 mL/hr over 30 Minutes Intravenous To [facility] 04/11/16 1404 04/12/16 0753   04/12/16 0548  cefoTEtan in Dextrose 5% (CEFOTAN) 2-2.08 GM-% IVPB    Comments:  Precious Haws   : cabinet override      04/12/16 0548 04/12/16 1759   04/12/16 0544  neomycin (MYCIFRADIN) tablet 1,000 mg  Status:  Discontinued     1,000 mg Oral 3 times per day 04/12/16 0544 04/12/16 0547   04/12/16 0544  metroNIDAZOLE (FLAGYL) tablet 1,000 mg  Status:  Discontinued     1,000 mg Oral 3 times per day 04/12/16 0544 04/12/16 0547  Assessment/Plan: s/p Procedure(s): EXPLORATORY LAPAROTOMY, REPAIR OF ANASTAMOTIC LEAK COLOSTOMY I  have not talked with the family recently but need to do so  Not sure why the patient is not opening up with his loop ileostomy, but will get palin abdominal X-ray.  LOS: 14 days    Kathryne Eriksson. Dahlia Bailiff, MD, FACS [phone] [id number] [facility] 04/26/2016

## 2016-04-26 NOTE — Progress Notes (Addendum)
PHARMACY - ADULT TOTAL PARENTERAL NUTRITION CONSULT NOTE   Pharmacy Consult:  TPN Indication:  Prolonged ileus  Patient Measurements: Height: 5' 10.5" (179.1 cm) Weight: (!) 391 lb (177.4 kg) IBW/kg (Calculated) : 74.15 TPN AdjBW (KG): 101.4 Body mass index is 55.31 kg/m.  Assessment:  78 YOM with history of an obstructing tumor s/p emergent colectomy with colostomy of right transverse colon in 2014.  Patient presented on 04/12/16 for reversal of colostomy.  Postoperatively, he had emesis on 04/14/16 and a distended abdomen on 04/15/16.  He has consumed </= 10% of his meals for 7 days and Pharmacy consulted to manage TPN for prolonged ileus. Patient reports eating regularly and gained 3-4 lbs before admission.  GI: s/p ex-lap and repair of leak on 2/27.  Last BM 2/28.  NGT output 821mL 3/5 and 500 mL overnight per RN. Prealbumin 21 >> 10.4 d/t critical illness. Simethicone, PPI IV.  Endo: No hx DM, A1c 5.5% - CBGs controlled Insulin requirements in the past 24 hours: 4 units of SSI Lytes: all WNL except elevated Cl at 114, CorCal 10.7 (Phos low normal-Kphos 10 mmol 3/5) Renal: CKD3 - SCr down 1.93, BUN 21 - good UOP 0.7 ml/kg/hr, NS at [facility]. Flomax, bethanechol. Pulm: intubated 2/27 - FiO2 30%.  Extubated when mentation improves. Cards: CM / CHF (EF 55-60%) / CAD post PCI / HLD / HTN / PAD / hx atrial tachycardia and torsades. BP/HR controlled- metoprolol, ASA PR. CVP 14.  Hepatobil: LFTs / tbili WNL. TG mildly elevated at 183 Neuro: Fentanyl/Precedex, Propofol off 3/1 - GCS 12, CPOT 0-4, RASS 3 (goal -1). Clonazepam PO. Significant versed PRN needs (18mg ) ID: Zosyn/Eraxis for sepsis+ peritonitis.  Now afebrile, WBC WNL. Cultures NGTD. Heme/Onc: Hx colon cancer - hgb down 8.7, plts normalized Best Practices: heparin SQ, CHG/mupirocin TPN Access: PICC placed 04/18/16 TPN start date: 04/18/16  Nutritional Goals: per RD recs on 3/2 1850-2400 kCal and >/= 187 gm protein per day Goal: Clinimix  E 5/15 at 117mL/hr + MWF lipid = weekly average of 2335 kcal with 150g protein per day   Current Nutrition:  Clinimix E 5/15 + MWF lipids  Pivot 1.5 at 20 mL/hr (720 kCal and 45gm per day) >> currently off d/t distention, NGT output  Plan:  - Increase Clinimix 5/15 with electrolyte additives to 100 ml/hr. Start MWF lipids at 91mL/hr x12 hrs tomorrow.  - TPN provides 1704 kCal and 120 g of protein per day, meeting 92% of kCal and 64% of protein needs - Daily multivitamin in TPN - Trace elements every other day, next dose today 3/6 - Continue sensitive SSI Q6H - Kphos 15 mmol IV x1.  Daily Phos per MD. - Monitor I&O, volume status (PCCM aware TPN will provide 2448mL and once at goal rate the volume will be 3L). - F/U resuming TF   Argie Ramming, PharmD Pharmacy Resident  Pager [phone] 04/26/16 7:20 AM   Remigio Eisenmenger D. Mina Marble, PharmD, BCPS Pager:  [phone] 04/26/2016, 11:40 AM

## 2016-04-26 NOTE — Progress Notes (Signed)
Nutrition Follow-up  DOCUMENTATION CODES:   Morbid obesity  INTERVENTION:    TPN per pharmacy  NUTRITION DIAGNOSIS:   Inadequate oral intake related to inability to eat as evidenced by NPO status; ongoing  GOAL:   Provide needs based on ASPEN/SCCM guidelines; progressing  MONITOR:   Diet advancement, Labs, Weight trends, I & O's, TPN prescription   ASSESSMENT:   66 yo Male who is several years out from an emergency colectomy with colostomy of the right transverse colon for an obstructing tumor; presented for reversal of his colostomy.  Pt s/p procedure 2/21: REVERSAL OF COLOSTOMY  Patient is currently intubated on ventilator support Temp (24hrs), Avg:98.3 F (36.8 C), Min:97.9 F (36.6 C), Max:99 F (37.2 C)  TF (Pivot 1.5 formula) stopped 3/5 given abdominal distention; was infusing at 20 ml/hr. Surgery note reviewed; ileostomy is not producing any enteric drainage. Labs and medications reviewed. NGT is currently to LIS. CBG's [id number].  Patient is receiving TPN with Clinimix E 5/15 @ 100 ml/hr and lipids @ 20 ml/hr x 12 hours (MWF).  Provides 1704 kcal and 120 grams protein per day.  Meets 92% minimum estimated energy needs and 64% minimum estimated protein needs.  Diet Order:  Diet NPO time specified .TPN (CLINIMIX-E) Adult .TPN (CLINIMIX-E) Adult  Skin:   (Incision on abdomen)  Last BM:  Ileostomy  Height:   Ht Readings from Last 1 Encounters:  04/22/16 5' 10.5" (1.791 m)    Weight:   Wt Readings from Last 1 Encounters:  04/26/16 (!) 391 lb (177.4 kg)  Admit weight 04/12/16 377 lbs (171 kg)  Ideal Body Weight:  75 kg  BMI:  Body mass index is 55.31 kg/m.  Estimated Nutritional Needs:   Kcal:  [id number]  Protein:  >/= 187 gm  Fluid:  per MD  EDUCATION NEEDS:   No education needs identified at this time  [name], RD, LDN Pager #: [phone] After-Hours Pager #: [phone]

## 2016-04-27 LAB — POCT I-STAT 3, ART BLOOD GAS (G3+)
Acid-base deficit: 1 mmol/L (ref 0.0–2.0)
BICARBONATE: 25.5 mmol/L (ref 20.0–28.0)
O2 Saturation: 99 %
PCO2 ART: 50.1 mmHg — AB (ref 32.0–48.0)
PO2 ART: 130 mmHg — AB (ref 83.0–108.0)
TCO2: 27 mmol/L (ref 0–100)
pH, Arterial: 7.315 — ABNORMAL LOW (ref 7.350–7.450)

## 2016-04-27 LAB — CULTURE, BLOOD (ROUTINE X 2)
Culture: NO GROWTH
Culture: NO GROWTH

## 2016-04-27 LAB — BASIC METABOLIC PANEL
Anion gap: 7 (ref 5–15)
BUN: 20 mg/dL (ref 6–20)
CALCIUM: 8.3 mg/dL — AB (ref 8.9–10.3)
CO2: 24 mmol/L (ref 22–32)
CREATININE: 1.7 mg/dL — AB (ref 0.61–1.24)
Chloride: 111 mmol/L (ref 101–111)
GFR calc Af Amer: 48 mL/min — ABNORMAL LOW (ref >60)
GFR, EST NON AFRICAN AMERICAN: 41 mL/min — AB (ref >60)
Glucose, Bld: 147 mg/dL — ABNORMAL HIGH (ref 65–99)
Potassium: 4.1 mmol/L (ref 3.5–5.1)
SODIUM: 142 mmol/L (ref 135–145)

## 2016-04-27 LAB — CBC
HCT: 28.6 % — ABNORMAL LOW (ref 39.0–52.0)
Hemoglobin: 8.4 g/dL — ABNORMAL LOW (ref 13.0–17.0)
MCH: 26.8 pg (ref 26.0–34.0)
MCHC: 29.4 g/dL — AB (ref 30.0–36.0)
MCV: 91.1 fL (ref 78.0–100.0)
PLATELETS: 275 10*3/uL (ref 150–400)
RBC: 3.14 MIL/uL — ABNORMAL LOW (ref 4.22–5.81)
RDW: 15.4 % (ref 11.5–15.5)
WBC: 8 10*3/uL (ref 4.0–10.5)

## 2016-04-27 LAB — GLUCOSE, CAPILLARY
GLUCOSE-CAPILLARY: 144 mg/dL — AB (ref 65–99)
Glucose-Capillary: 139 mg/dL — ABNORMAL HIGH (ref 65–99)
Glucose-Capillary: 141 mg/dL — ABNORMAL HIGH (ref 65–99)
Glucose-Capillary: 143 mg/dL — ABNORMAL HIGH (ref 65–99)

## 2016-04-27 LAB — MAGNESIUM: Magnesium: 1.9 mg/dL (ref 1.7–2.4)

## 2016-04-27 LAB — PHOSPHORUS: Phosphorus: 2.8 mg/dL (ref 2.5–4.6)

## 2016-04-27 MED ORDER — POTASSIUM PHOSPHATES 15 MMOLE/5ML IV SOLN
10.0000 mmol | Freq: Once | INTRAVENOUS | Status: AC
  Administered 2016-04-27: 10 mmol via INTRAVENOUS
  Filled 2016-04-27: qty 3.33

## 2016-04-27 MED ORDER — FAT EMULSION 20 % IV EMUL
240.0000 mL | INTRAVENOUS | Status: AC
  Administered 2016-04-27: 240 mL via INTRAVENOUS
  Filled 2016-04-27: qty 250

## 2016-04-27 MED ORDER — MAGNESIUM SULFATE 2 GM/50ML IV SOLN
2.0000 g | Freq: Once | INTRAVENOUS | Status: AC
  Administered 2016-04-27: 2 g via INTRAVENOUS
  Filled 2016-04-27: qty 50

## 2016-04-27 MED ORDER — SODIUM PHOSPHATES 15 MMOLE/5ML IV SOLN
INTRAVENOUS | Status: AC
  Administered 2016-04-27: 17:00:00 via INTRAVENOUS
  Filled 2016-04-27: qty 2400

## 2016-04-27 NOTE — Progress Notes (Signed)
PHARMACY - ADULT TOTAL PARENTERAL NUTRITION CONSULT NOTE   Pharmacy Consult:  TPN Indication:  Prolonged ileus  Patient Measurements: Height: 5' 10.5" (179.1 cm) Weight: (!) 391 lb (177.4 kg) IBW/kg (Calculated) : 74.15 TPN AdjBW (KG): 101.4 Body mass index is 55.31 kg/m.  Assessment:  34 YOM with history of an obstructing tumor s/p emergent colectomy with colostomy of right transverse colon in 2014.  Patient presented on 04/12/16 for reversal of colostomy.  Postoperatively, he had emesis on 04/14/16 and a distended abdomen on 04/15/16.  He has consumed </= 10% of his meals for 7 days and Pharmacy consulted to manage TPN for prolonged ileus. Patient reports eating regularly and gained 3-4 lbs before admission.  GI: s/p ex-lap and repair of leak on 2/27.  Last BM 2/28.  NGT output 873mL 3/5 > 200 mL 3/6. Abd distended and illeostomy not draining per surgery. Abd xray 3/6 with improving ileus. Prealbumin 21 >> 10.4 d/t critical illness. Simethicone, PPI IV.  Endo: No hx DM, A1c 5.5% - CBGs controlled Insulin requirements in the past 24 hours: 5 units of SSI Lytes: all WNL, CorCal 10.2, phos 2.8 (replaced 3/6) Renal: CKD3 - SCr down 1.7, BUN 20 - UOP down 0.7>0.4 ml/kg/hr, NS at [facility]. Flomax, bethanechol. Pulm: intubated 2/27 - FiO2 40%.  Extubated when mentation improves. Cards: CM / CHF (EF 55-60%) / CAD post PCI / HLD / HTN / PAD / hx atrial tachycardia and torsades. BP controlled, HR improving - metoprolol, ASA PR. CVP 16, +7L since adm.  Hepatobil: LFTs / tbili WNL. TG mildly elevated at 183 Neuro: Fentanyl/Precedex, Propofol off 3/1 - GCS 13, CPOT 0-5, RASS 2 (goal -2). Clonazepam PO. Significant versed PRN needs (20 mg).  More alert and oriented today. ID: Zosyn/Eraxis for sepsis+ peritonitis.  Now afebrile, WBC WNL. Cultures NGTD. Heme/Onc: Hx colon cancer - hgb down 8.4, plts normalized Best Practices: heparin SQ, CHG/mupirocin TPN Access: PICC placed 04/18/16 TPN start date:  04/18/16  Nutritional Goals: per RD recs on 3/6 2440-1027 kCal and >/= 187 gm protein per day Goal: Clinimix 5/15 with electrolyte additives at 115mL/hr + MWF lipid = weekly average of 2335 kcal with 150g protein per day   Current Nutrition:  Clinimix 5/15 with electrolyte additives + MWF lipids  Pivot 1.5 at 20 mL/hr 3/2 >> 3/5, off d/t distended abd  Plan:  - Continue Clinimix 5/15 with electrolyte additives at 100 ml/hr. Start MWF lipids at 46mL/hr x12 hrs today.  - TPN provides 2184 kCal and 120 g of protein per day, meeting 100% of kCal and 64% of protein needs - Daily multivitamin in TPN - Trace elements every other day, next dose 3/8 - Continue sensitive SSI Q6H - 76mmol Kphos IV x1. Daily Phos per MD. - Mag sulfate 2gm IV x 1 - Monitor I&O, volume status (PCCM aware TPN will provide 3L at goal rate). - F/U resuming TF, CT result, AM labs  Argie Ramming, PharmD Pharmacy Resident  Pager [phone] 04/27/16 7:48 AM    Remigio Eisenmenger D. Mina Marble, PharmD, BCPS Pager:  [phone] 04/27/2016, 10:36 AM

## 2016-04-27 NOTE — Progress Notes (Signed)
RT place patient back on full support due to ABG results.  RN aware. Decreased FIO2 30% due to ABG results and stable SATS.

## 2016-04-27 NOTE — Progress Notes (Addendum)
RT placed patient on CPAP/PS per Dr. Hulen Skains. Rn aware. RT unable to move ETT due to patient agitation. CXR reviewed.

## 2016-04-27 NOTE — Progress Notes (Signed)
GS Progress Note Subjective: Patient is awake thei morning and seems oriented.  Does not appear to be in distress  Objective: Vital signs in last 24 hours: Temp:  [98 F (36.7 C)-99.2 F (37.3 C)] 98.7 F (37.1 C) (03/07 0400) Pulse Rate:  [71-151] 129 (03/07 0700) Resp:  [0-28] 21 (03/07 0700) BP: (92-164)/(57-119) 150/87 (03/07 0700) SpO2:  [92 %-100 %] 96 % (03/07 0700) FiO2 (%):  [40 %] 40 % (03/07 0328) Last BM Date: 04/20/16  Intake/Output from previous day: 03/06 0701 - 03/07 0700 In: 4831.8 [I.V.:4296.8; IV Piggyback:535] Out: 1975 [Urine:1685; Emesis/NG output:200; Stool:90] Intake/Output this shift: No intake/output data recorded.  Lungs: Clear and sats are 100% on FIO2 40%.  Weaning currently  Abd: Still very distended, but seems a bit softer to me today than yesterday.  Minimal output from NGT.  On TPN.  Abd xray yesterday did not show any free air and not markedly distended bowel loops  Extremities: Edematous  Neuro: Intact  Lab Results: CBC   Recent Labs  04/26/16 0300 04/27/16 0346  WBC 8.6 8.0  HGB 8.7* 8.4*  HCT 29.6* 28.6*  PLT 210 275   BMET  Recent Labs  04/26/16 0300 04/27/16 0346  NA 144 142  K 4.4 4.1  CL 114* 111  CO2 27 24  GLUCOSE 154* 147*  BUN 21* 20  CREATININE 1.93* 1.70*  CALCIUM 8.8* 8.3*   PT/INR No results for input(s): LABPROT, INR in the last 72 hours. ABG No results for input(s): PHART, HCO3 in the last 72 hours.  Invalid input(s): PCO2, PO2  Studies/Results: Dg Abd Portable 1v  Result Date: 04/26/2016 CLINICAL DATA:  Intubated, postop, right side colostomy EXAM: PORTABLE ABDOMEN - 1 VIEW COMPARISON:  04/19/2016 FINDINGS: Mild gaseous distended small bowel loops in left mid abdomen with slight improvement from prior exam probable improving ileus. There is NG tube with tip in mid stomach. Probable drain in right upper abdomen. Skin staples are noted in right mid abdomen. IMPRESSION: Mild gaseous distended small  bowel loops in mid left abdomen with slight improvement from prior exam probable improving ileus. NG tube with tip in mid stomach. Probable drain in right upper abdomen. Skin staples are noted in mid right abdomen. Electronically Signed   By: Lahoma Crocker M.D.   On: 04/26/2016 14:28    Anti-infectives: Anti-infectives    Start     Dose/Rate Route Frequency Ordered Stop   04/21/16 1000  anidulafungin (ERAXIS) 100 mg in sodium chloride 0.9 % 100 mL IVPB     100 mg over 90 Minutes Intravenous Every 24 hours 04/20/16 1008     04/20/16 1045  anidulafungin (ERAXIS) 200 mg in sodium chloride 0.9 % 200 mL IVPB     200 mg over 180 Minutes Intravenous  Once 04/20/16 1008 04/20/16 1422   04/19/16 2030  piperacillin-tazobactam (ZOSYN) IVPB 3.375 g     3.375 g 12.5 mL/hr over 240 Minutes Intravenous Every 8 hours 04/19/16 1958     04/19/16 1230  cefoTEtan in Dextrose 5% (CEFOTAN) 2-2.08 GM-% IVPB    Comments:  Ivar Drape   : cabinet override      04/19/16 1230 04/19/16 1245   04/12/16 2000  cefoTEtan (CEFOTAN) 2 g in dextrose 5 % 50 mL IVPB     2 g 100 mL/hr over 30 Minutes Intravenous Every 12 hours 04/12/16 1120 04/12/16 2125   04/12/16 0700  cefoTEtan (CEFOTAN) 2 g in dextrose 5 % 50 mL IVPB  2 g 100 mL/hr over 30 Minutes Intravenous To ShortStay Surgical 04/11/16 1404 04/12/16 0753   04/12/16 0548  cefoTEtan in Dextrose 5% (CEFOTAN) 2-2.08 GM-% IVPB    Comments:  Precious Haws   : cabinet override      04/12/16 0548 04/12/16 1759   04/12/16 0544  neomycin (MYCIFRADIN) tablet 1,000 mg  Status:  Discontinued     1,000 mg Oral 3 times per day 04/12/16 0544 04/12/16 0547   04/12/16 0544  metroNIDAZOLE (FLAGYL) tablet 1,000 mg  Status:  Discontinued     1,000 mg Oral 3 times per day 04/12/16 0544 04/12/16 0547      Assessment/Plan: s/p Procedure(s): EXPLORATORY LAPAROTOMY, REPAIR OF ANASTAMOTIC LEAK COLOSTOMY Probably get CT scan of the abdomen with oral contrast only.  LOS: 15 days     Kathryne Eriksson. Dahlia Bailiff, MD, FACS [phone] [facility] Surgery 04/27/2016

## 2016-04-27 NOTE — Progress Notes (Signed)
[place] [facility] Attending Note  Presenting HPI:  66 y.o. 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 Presented on 2/20 for reversal of colostomy , developed recurrent abdominal pain on 2/26 and taken back to the OR on 2/27 finding free air with peritonitis and anastomotic leakage. Patient returned from the operating room on ventilator and with ongoing shock.  IMAGING/STUDIES: TTE 2/24: LV normal in size with moderate LVH. EF 55-60% with no regional wall motion abnormalities.    MICROBIOLOGY: Blood Cultures x2 2/27 >>>ng Tracheal Aspirate Culture 2/28 >>> nml flora MRSA PCR 2/28:  Negative  Blood Cultures x2 3/2 >>> Urine Culture 3/2 >>>ng  ANTIBIOTICS: Cefotan 2/20 (periop prophylaxis) Zosyn 2/27 >>> Eraxis 2/28 >>>  SIGNIFICANT EVENTS: 02/20 - Admit for colostomy reversal 02/23 - Postop ileus & tachycardia 02/24 - Cardiology Consulted 02/27 - OR for Exploratory Laparotomy finding anastomotic leak & peritonitis  3/2   transitioned to Precedex infusion and  fentanyl drip, febrile  Subjective:   No resp issues overnight. Remains anxious at times.  Abdomen is still  distended.    Vent Mode: CPAP;PSV FiO2 (%):  [40 %] 40 % Set Rate:  [14 bmp] 14 bmp Vt Set:  [580 mL] 580 mL PEEP:  [5 cmH20-8 cmH20] 5 cmH20 Pressure Support:  [10 cmH20] 10 cmH20 Plateau Pressure:  [5 cmH20-26 cmH20] 5 cmH20  Temp:  [98.2 F (36.8 C)-99.2 F (37.3 C)] 98.7 F (37.1 C) (03/07 0400) Pulse Rate:  [71-151] 130 (03/07 0800) Resp:  [0-28] 26 (03/07 0800) BP: (92-187)/(57-150) 187/150 (03/07 0800) SpO2:  [92 %-100 %] 95 % (03/07 0800) FiO2 (%):  [40 %] 40 % (03/07 0800)  Gen.:  Morbidly obese. Sedated, intubated. NAD Skin: Warm and dry. No rash on exposed skin. Abdominal dressing with serosanguinous d/c Genitourinary: Foley catheter in place.no bleeding Pulmonary: Distant breath sounds. Symmetric chest wall rise  on ventilator. Cardiovascular: Regular rhythm. Gr 2  edema. Unable to appreciate JVD given body habitus. Neurological: Sedated. Not following commands. Pupils symmetric and reactive. Abd-distended , ostomy pink/ ?dusky, dec BS. Soft, remains distended. ? Some purulence from ostomy  LINES/TUBES: OETT 7.5 2/27 >>> R IJ CVL 2/27 >>> LUE DL PICC 2/26 >>> Foley >>> L NGT 2/27 >>> L RADIAL ART LINE 2/27 >>> discontinued PIV  CBC Latest Ref Rng & Units 04/27/2016 04/26/2016 04/25/2016  WBC 4.0 - 10.5 K/uL 8.0 8.6 7.5  Hemoglobin 13.0 - 17.0 g/dL 8.4(L) 8.7(L) 8.8(L)  Hematocrit 39.0 - 52.0 % 28.6(L) 29.6(L) 30.5(L)  Platelets 150 - 400 K/uL 275 210 194   BMP Latest Ref Rng & Units 04/27/2016 04/26/2016 04/25/2016  Glucose 65 - 99 mg/dL 147(H) 154(H) 150(H)  BUN 6 - 20 mg/dL 20 21(H) 25(H)  Creatinine 0.61 - 1.24 mg/dL 1.70(H) 1.93(H) 2.28(H)  Sodium 135 - 145 mmol/L 142 144 144  Potassium 3.5 - 5.1 mmol/L 4.1 4.4 4.5  Chloride 101 - 111 mmol/L 111 114(H) 114(H)  CO2 22 - 32 mmol/L 24 27 27   Calcium 8.9 - 10.3 mg/dL 8.3(L) 8.8(L) 8.3(L)   Hepatic Function Latest Ref Rng & Units 04/25/2016 04/23/2016 04/22/2016  Total Protein 6.5 - 8.1 g/dL 5.4(L) - 5.1(L)  Albumin 3.5 - 5.0 g/dL 1.6(L) 1.6(L) 1.7(L)  AST 15 - 41 U/L 25 - 21  ALT 17 - 63 U/L 28 - 21  Alk Phosphatase 38 - 126 U/L 74 - 60  Total Bilirubin 0.3 - 1.2 mg/dL 0.4 - 1.0  Bilirubin,  Direct <=0.2 mg/dL - - -      ASSESSMENT/PLAN:  66 y.o. male, initially admitted for reversal of ostomy, ended up with peritonitis from bowel anastomotic leak. Off pressors & improving renal function. TF stopped on 3/5  because of concern for abd distention. More abdominal distention since 3/6. Not acitvely weaning 2/2 abdominal issues.   1. S/P Septic shock secondary to peritonitis.  Continuing broad-spectrum coverage with Zosyn and Eraxis. Cultures remain (-). Abdomen is noted to be distended since 3/6.  TF held on 3/5.  Surgery plans for ct abd on 3/7.  Not  sure if its ileus vs infectious process.  2. Acute on chronic hypoxemic and hypercarbic respiratory failure: Known underlying OSA/OHS. Active weaning on hold 2/2 abdominal distention.   3. Agitation/Acute encephalopathy/delirium - Continuing Precedex infusion and fentanyl drip for pain relief. Cannot use seroquel/ risperdal due to h/o torsades. Keeping sedated 2/2 abd issues.  He easily gets agitated during PST last week. 4. Acute on chronic stage III renal failure: baseline 1.7, cr trending back down  Check  Electrolytes daily, monitor urine output. Lasix on hold.  He is diurescing.  5. Hyperglycemia: Mild. No history of diabetes mellitus. Monitoring serum glucose on daily labs. On SSI.  6. History of coronary artery disease: ASA daily 7. History of essential hypertension: metoprolol was being given per NGT.  I am not sure if he is absorbing it.  Will d/c and keep lopressor IV prn.  BP is OK.   Prophylaxis:  Heparin subcutaneous daily, Protonix  & SCDs. Diet:  On  TPN. TF on hold 3/5.  Code Status:  Full code  Disposition:  Remains intubated & critically ill in the ICU. Family Update:  No family at bedside  this morning.  Summary - Shock/AKI due to peritonitis.  Shock is improved but pt with abdominal distention since 3/6.  Weaning on hold pending abd issues.   I spent  30  minutes of Critical Care time with this patient today.   Monica Becton, MD 04/27/2016, 8:30 AM [place] Pager [phone] After 3 pm or if no answer, call [phone]

## 2016-04-28 ENCOUNTER — Inpatient Hospital Stay (HOSPITAL_COMMUNITY): Payer: Medicare Other

## 2016-04-28 DIAGNOSIS — Z4659 Encounter for fitting and adjustment of other gastrointestinal appliance and device: Secondary | ICD-10-CM

## 2016-04-28 LAB — CBC
HEMATOCRIT: 28.4 % — AB (ref 39.0–52.0)
HEMOGLOBIN: 8.3 g/dL — AB (ref 13.0–17.0)
MCH: 26.7 pg (ref 26.0–34.0)
MCHC: 29.2 g/dL — ABNORMAL LOW (ref 30.0–36.0)
MCV: 91.3 fL (ref 78.0–100.0)
Platelets: 293 10*3/uL (ref 150–400)
RBC: 3.11 MIL/uL — ABNORMAL LOW (ref 4.22–5.81)
RDW: 15.3 % (ref 11.5–15.5)
WBC: 9.1 10*3/uL (ref 4.0–10.5)

## 2016-04-28 LAB — GLUCOSE, CAPILLARY
Glucose-Capillary: 123 mg/dL — ABNORMAL HIGH (ref 65–99)
Glucose-Capillary: 138 mg/dL — ABNORMAL HIGH (ref 65–99)
Glucose-Capillary: 138 mg/dL — ABNORMAL HIGH (ref 65–99)
Glucose-Capillary: 154 mg/dL — ABNORMAL HIGH (ref 65–99)

## 2016-04-28 LAB — COMPREHENSIVE METABOLIC PANEL
ALK PHOS: 81 U/L (ref 38–126)
ALT: 26 U/L (ref 17–63)
ANION GAP: 4 — AB (ref 5–15)
AST: 24 U/L (ref 15–41)
Albumin: 1.5 g/dL — ABNORMAL LOW (ref 3.5–5.0)
BILIRUBIN TOTAL: 0.5 mg/dL (ref 0.3–1.2)
BUN: 19 mg/dL (ref 6–20)
CALCIUM: 8.3 mg/dL — AB (ref 8.9–10.3)
CO2: 25 mmol/L (ref 22–32)
CREATININE: 1.79 mg/dL — AB (ref 0.61–1.24)
Chloride: 113 mmol/L — ABNORMAL HIGH (ref 101–111)
GFR calc non Af Amer: 39 mL/min — ABNORMAL LOW (ref >60)
GFR, EST AFRICAN AMERICAN: 45 mL/min — AB (ref >60)
Glucose, Bld: 155 mg/dL — ABNORMAL HIGH (ref 65–99)
Potassium: 4.1 mmol/L (ref 3.5–5.1)
Sodium: 142 mmol/L (ref 135–145)
TOTAL PROTEIN: 5.4 g/dL — AB (ref 6.5–8.1)

## 2016-04-28 LAB — PHOSPHORUS: PHOSPHORUS: 2.8 mg/dL (ref 2.5–4.6)

## 2016-04-28 LAB — MAGNESIUM: Magnesium: 2.1 mg/dL (ref 1.7–2.4)

## 2016-04-28 MED ORDER — ORAL CARE MOUTH RINSE
15.0000 mL | OROMUCOSAL | Status: DC
  Administered 2016-04-28 – 2016-05-08 (×99): 15 mL via OROMUCOSAL

## 2016-04-28 MED ORDER — POTASSIUM PHOSPHATES 15 MMOLE/5ML IV SOLN
15.0000 mmol | Freq: Once | INTRAVENOUS | Status: AC
  Administered 2016-04-28: 15 mmol via INTRAVENOUS
  Filled 2016-04-28: qty 5

## 2016-04-28 MED ORDER — TRACE MINERALS CR-CU-MN-SE-ZN 10-1000-500-60 MCG/ML IV SOLN
INTRAVENOUS | Status: AC
  Administered 2016-04-28: 17:00:00 via INTRAVENOUS
  Filled 2016-04-28: qty 3000

## 2016-04-28 MED ORDER — FUROSEMIDE 10 MG/ML IJ SOLN
80.0000 mg | Freq: Two times a day (BID) | INTRAMUSCULAR | Status: AC
  Administered 2016-04-28 – 2016-04-29 (×4): 80 mg via INTRAVENOUS
  Filled 2016-04-28 (×4): qty 8

## 2016-04-28 MED ORDER — CHLORHEXIDINE GLUCONATE 0.12% ORAL RINSE (MEDLINE KIT)
15.0000 mL | Freq: Two times a day (BID) | OROMUCOSAL | Status: DC
  Administered 2016-04-28 – 2016-05-08 (×20): 15 mL via OROMUCOSAL

## 2016-04-28 MED ORDER — SODIUM CHLORIDE 0.45 % IV SOLN
INTRAVENOUS | Status: DC
  Administered 2016-04-28: 10 mL via INTRAVENOUS

## 2016-04-28 MED ORDER — IOPAMIDOL (ISOVUE-300) INJECTION 61%
INTRAVENOUS | Status: AC
  Administered 2016-04-28: 30 mL
  Filled 2016-04-28: qty 30

## 2016-04-28 NOTE — Progress Notes (Signed)
Pulmonary & Critical Care Attending Note  Presenting HPI:  66 y.o. 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 Presented on 2/20 for reversal of colostomy , developed recurrent abdominal pain on 2/26 and taken back to the OR on 2/27 finding free air with peritonitis and anastomotic leakage. Patient returned from the operating room on ventilator and with ongoing shock.  IMAGING/STUDIES: TTE 2/24: LV normal in size with moderate LVH. EF 55-60% with no regional wall motion abnormalities.    MICROBIOLOGY: Blood Cultures x2 2/27 >>>ng Tracheal Aspirate Culture 2/28 >>> nml flora MRSA PCR 2/28:  Negative  Blood Cultures x2 3/2 >>> Urine Culture 3/2 >>>ng  ANTIBIOTICS: Cefotan 2/20 (periop prophylaxis) Zosyn 2/27 >>> Eraxis 2/28 >>>  SIGNIFICANT EVENTS: 02/20 - Admit for colostomy reversal 02/23 - Postop ileus & tachycardia 02/24 - Cardiology Consulted 02/27 - OR for Exploratory Laparotomy finding anastomotic leak & peritonitis  3/2   transitioned to Precedex infusion and  fentanyl drip, febrile  Subjective:   For CT No pressors   Vent Mode: PRVC FiO2 (%):  [30 %-40 %] 30 % Set Rate:  [14 bmp] 14 bmp Vt Set:  [580 mL] 580 mL PEEP:  [5 cmH20] 5 cmH20 Plateau Pressure:  [18 cmH20-29 cmH20] 29 cmH20  Temp:  [98.2 F (36.8 C)-99.5 F (37.5 C)] 98.9 F (37.2 C) (03/08 0745) Pulse Rate:  [73-132] 89 (03/08 0700) Resp:  [12-23] 19 (03/08 0700) BP: (100-180)/(61-108) 160/85 (03/08 0700) SpO2:  [89 %-100 %] 92 % (03/08 0700) FiO2 (%):  [30 %-40 %] 30 % (03/08 0311)  Gen.:  Morbidly obese. Sedated, intubated. NAD Skin: Warm and dry. No rash on exposed skin. Abdominal dressing dry Genitourinary: Foley catheter in place.no bleeding Pulmonary: ronchi Cardiovascular: 1 s2 RRR Neurological: rass  -2 Abd-distended , ostomy pink, low BS. Soft, remains distended.   LINES/TUBES: OETT 7.5 2/27 >>> R IJ CVL 2/27  >>> LUE DL PICC 2/26 >>> Foley >>> L NGT 2/27 >>> L RADIAL ART LINE 2/27 >>> discontinued PIV  CBC Latest Ref Rng & Units 04/28/2016 04/27/2016 04/26/2016  WBC 4.0 - 10.5 K/uL 9.1 8.0 8.6  Hemoglobin 13.0 - 17.0 g/dL 8.3(L) 8.4(L) 8.7(L)  Hematocrit 39.0 - 52.0 % 28.4(L) 28.6(L) 29.6(L)  Platelets 150 - 400 K/uL 293 275 210   BMP Latest Ref Rng & Units 04/28/2016 04/27/2016 04/26/2016  Glucose 65 - 99 mg/dL 155(H) 147(H) 154(H)  BUN 6 - 20 mg/dL 19 20 21(H)  Creatinine 0.61 - 1.24 mg/dL 1.79(H) 1.70(H) 1.93(H)  Sodium 135 - 145 mmol/L 142 142 144  Potassium 3.5 - 5.1 mmol/L 4.1 4.1 4.4  Chloride 101 - 111 mmol/L 113(H) 111 114(H)  CO2 22 - 32 mmol/L 25 24 27   Calcium 8.9 - 10.3 mg/dL 8.3(L) 8.3(L) 8.8(L)   Hepatic Function Latest Ref Rng & Units 04/28/2016 04/25/2016 04/23/2016  Total Protein 6.5 - 8.1 g/dL 5.4(L) 5.4(L) -  Albumin 3.5 - 5.0 g/dL 1.5(L) 1.6(L) 1.6(L)  AST 15 - 41 U/L 24 25 -  ALT 17 - 63 U/L 26 28 -  Alk Phosphatase 38 - 126 U/L 81 74 -  Total Bilirubin 0.3 - 1.2 mg/dL 0.5 0.4 -  Bilirubin, Direct <=0.2 mg/dL - - -      ASSESSMENT/PLAN:  66 y.o. male, initially admitted for reversal of ostomy, ended up with peritonitis from bowel anastomotic leak. Off pressors & improving renal function. TF stopped on 3/5  because of concern for abd distention.  More abdominal distention since 3/6. Not acitvely weaning 2/2 abdominal issues.   1. S/P Septic shock (resolved) secondary to peritonitis.  Continuing broad-spectrum coverage with Zosyn and Eraxis. Cultures remain (-). Abdomen is noted to be distended since 3/6.  TF held on 3/5.  For CT this am, TPN needed 2. Acute on chronic hypoxemic and hypercarbic respiratory failure: Known underlying OSA/OHS. There is no contraindication to weaning, may need slight higher rate on rest, wean ps now, no extubation planned, may need trach early next week if not progressing 3. Agitation/Acute encephalopathy/delirium - Continuing Precedex infusion and  fentanyl drip for pain relief. Cannot use seroquel/ risperdal due to h/o torsades. WUA on going, may need to add scheduled benzo to reduce continues sedation 4. Acute on chronic stage III renal failure: at about baseline crt, was pos 3.4 liters just last 24 hours, need lasix to neg 1 liter goals, chem , avoid saline with CL noted 5. Hyperglycemia: with glu in am bmet, add ssi cbg 6. History of coronary artery disease: ASA daily 7. History of essential hypertension: IV BB  Prophylaxis:  Heparin subcutaneous daily, Protonix  & SCDs. Diet:  On  TPN. Code Status:  Full code  Family Update:  No family at bedside  this morning.  Ccm time 35 min   Lavon Paganini. Titus Mould, MD, [place] Pgr: [facility]

## 2016-04-28 NOTE — Progress Notes (Signed)
Patient transported to CT and back to room 4Y50 without complications.

## 2016-04-28 NOTE — Progress Notes (Signed)
GS Progress Note Subjective: Patient is weaning but abdomen is still large and loop ileostomy is not functioning.  Will need CT with oral contrast only today.  Objective: Vital signs in last 24 hours: Temp:  [98.2 F (36.8 C)-99.5 F (37.5 C)] 98.9 F (37.2 C) (03/08 0745) Pulse Rate:  [73-132] 89 (03/08 0700) Resp:  [12-26] 19 (03/08 0700) BP: (100-187)/(61-150) 160/85 (03/08 0700) SpO2:  [89 %-100 %] 92 % (03/08 0700) FiO2 (%):  [30 %-40 %] 30 % (03/08 0311) Last BM Date: 04/20/16  Intake/Output from previous day: 03/07 0701 - 03/08 0700 In: 5470.4 [I.V.:4827.4; NG/GT:90; IV Piggyback:553] Out: 2225 [Urine:2025; Emesis/NG output:150; Stool:50] Intake/Output this shift: No intake/output data recorded.  Lungs: Clear  Abd: Distended ;more than yesterday, no bowel sounds.  Extremities: Less edematous  Neuro: Sedated  Lab Results: CBC   Recent Labs  04/27/16 0346 04/28/16 0406  WBC 8.0 9.1  HGB 8.4* 8.3*  HCT 28.6* 28.4*  PLT 275 293   BMET  Recent Labs  04/27/16 0346 04/28/16 0406  NA 142 142  K 4.1 4.1  CL 111 113*  CO2 24 25  GLUCOSE 147* 155*  BUN 20 19  CREATININE 1.70* 1.79*  CALCIUM 8.3* 8.3*   PT/INR No results for input(s): LABPROT, INR in the last 72 hours. ABG  Recent Labs  04/27/16 1030  PHART 7.315*  HCO3 25.5    Studies/Results: Dg Abd Portable 1v  Result Date: 04/26/2016 CLINICAL DATA:  Intubated, postop, right side colostomy EXAM: PORTABLE ABDOMEN - 1 VIEW COMPARISON:  04/19/2016 FINDINGS: Mild gaseous distended small bowel loops in left mid abdomen with slight improvement from prior exam probable improving ileus. There is NG tube with tip in mid stomach. Probable drain in right upper abdomen. Skin staples are noted in right mid abdomen. IMPRESSION: Mild gaseous distended small bowel loops in mid left abdomen with slight improvement from prior exam probable improving ileus. NG tube with tip in mid stomach. Probable drain in right  upper abdomen. Skin staples are noted in mid right abdomen. Electronically Signed   By: Lahoma Crocker M.D.   On: 04/26/2016 14:28    Anti-infectives: Anti-infectives    Start     Dose/Rate Route Frequency Ordered Stop   04/21/16 1000  anidulafungin (ERAXIS) 100 mg in sodium chloride 0.9 % 100 mL IVPB     100 mg over 90 Minutes Intravenous Every 24 hours 04/20/16 1008     04/20/16 1045  anidulafungin (ERAXIS) 200 mg in sodium chloride 0.9 % 200 mL IVPB     200 mg over 180 Minutes Intravenous  Once 04/20/16 1008 04/20/16 1422   04/19/16 2030  piperacillin-tazobactam (ZOSYN) IVPB 3.375 g     3.375 g 12.5 mL/hr over 240 Minutes Intravenous Every 8 hours 04/19/16 1958     04/19/16 1230  cefoTEtan in Dextrose 5% (CEFOTAN) 2-2.08 GM-% IVPB    Comments:  Ivar Drape   : cabinet override      04/19/16 1230 04/19/16 1245   04/12/16 2000  cefoTEtan (CEFOTAN) 2 g in dextrose 5 % 50 mL IVPB     2 g 100 mL/hr over 30 Minutes Intravenous Every 12 hours 04/12/16 1120 04/12/16 2125   04/12/16 0700  cefoTEtan (CEFOTAN) 2 g in dextrose 5 % 50 mL IVPB     2 g 100 mL/hr over 30 Minutes Intravenous To ShortStay Surgical 04/11/16 1404 04/12/16 0753   04/12/16 0548  cefoTEtan in Dextrose 5% (CEFOTAN) 2-2.08 GM-% IVPB  CommentsPrecious Haws   : cabinet override      04/12/16 0548 04/12/16 1759   04/12/16 0544  neomycin (MYCIFRADIN) tablet 1,000 mg  Status:  Discontinued     1,000 mg Oral 3 times per day 04/12/16 0544 04/12/16 0547   04/12/16 0544  metroNIDAZOLE (FLAGYL) tablet 1,000 mg  Status:  Discontinued     1,000 mg Oral 3 times per day 04/12/16 0544 04/12/16 0547      Assessment/Plan: s/p Procedure(s): EXPLORATORY LAPAROTOMY, REPAIR OF ANASTAMOTIC LEAK COLOSTOMY CT scan of the abdomen and pelvis with oral contrast only.  LOS: 16 days    Kathryne Eriksson. Dahlia Bailiff, MD, FACS [phone] [facility] Surgery 04/28/2016

## 2016-04-28 NOTE — Progress Notes (Signed)
PHARMACY - ADULT TOTAL PARENTERAL NUTRITION CONSULT NOTE   Pharmacy Consult:  TPN Indication:  Prolonged ileus  Patient Measurements: Height: 5' 10.5" (179.1 cm) Weight: (!) 391 lb (177.4 kg) IBW/kg (Calculated) : 74.15 TPN AdjBW (KG): 101.4 Body mass index is 55.31 kg/m.  Assessment:  70 YOM with history of an obstructing tumor s/p emergent colectomy with colostomy of right transverse colon in 2014.  Patient presented on 04/12/16 for reversal of colostomy.  Postoperatively, he had emesis on 04/14/16 and a distended abdomen on 04/15/16.  He has consumed </= 10% of his meals for 7 days and Pharmacy consulted to manage TPN for prolonged ileus. Patient reports eating regularly and gained 3-4 lbs before admission.  GI: s/p ex-lap and repair of leak on 2/27. Abd distended and illeostomy not draining per surgery. For CT 3/8 AM. LBM 2/28. NGT output 832mL 3/5 > 150 mL 3/7. Prealbumin 21 >> 10.4 d/t critical illness. Simethicone, PPI IV.  Endo: No hx DM, A1c 5.5% - CBGs controlled Insulin requirements in the past 24 hours: 3 units of SSI Lytes: all WNL, CorCal 10.3, phos 2.8 (replaced 3/5-3/7) Renal: CKD3 - SCr stable 1.79, BUN 19 - UOP stable 0.5 ml/kg/hr, NS at [facility]. Flomax, bethanechol. Starting lasix 80 IV BID 3/8 x4 doses.  Pulm: intubated 2/27 - FiO2 30%.  Extubation when mentation improves. ABG with pH 7.315 Cards: CM / CHF (EF 55-60%) / CAD post PCI / HLD / HTN / PAD / hx atrial tachycardia and torsades. BP labile (540-981X systolic), HR improving - metoprolol IV, ASA PR. CVP 16, +9L since adm.  Hepatobil: LFTs / tbili WNL. TG mildly elevated at 183 Neuro: Fentanyl/Precedex, Propofol off 3/1 - GCS 12, CPOT 0-5, RASS -2 (goal -1). Clonazepam PO. Significant versed PRN needs (42 mg).   ID: Zosyn/Eraxis for sepsis+ peritonitis.  Now afebrile, WBC WNL. Cultures NGTD. Heme/Onc: Hx colon cancer - hgb down 8.3, plts normalized Best Practices: heparin SQ, CHG/mupirocin TPN Access: PICC placed  04/18/16 TPN start date: 04/18/16  Nutritional Goals: per RD recs on 3/6 9147-8295 kCal and >/= 187 gm protein per day Goal: Clinimix 5/15 with electrolyte additives at 165mL/hr + MWF lipid = weekly average of 2335 kcal with 150g protein per day   Current Nutrition:  Clinimix 5/15 with electrolyte additives + MWF lipids  Pivot 1.5 at 20 mL/hr 3/2 >> 3/5, off d/t distended abd  Plan:  - Increase Clinimix 5/15 with electrolyte additives to goal, 125 ml/hr. Continue MWF lipids at 61mL/hr x12 hrs tomorrow.  - Clinimix today provides 2130 kCal and 150 g of protein per day, meeting 100% of kCal and 80% of protein needs - Daily multivitamin in TPN - Trace elements every other day, next dose today 3/8 - Continue sensitive SSI Q6H - 15 mmol Kphos IV x1. Daily Phos per MD. - Monitor I&O, volume status (Discussed with Dr. Titus Mould who is okay advancing to goal rate - is aware this will add additional 600 mL fluid per day).  - F/U resuming TF, CT result  Argie Ramming, PharmD Pharmacy Resident  Pager [phone] 04/28/16 7:26 AM   Gracy Bruins, PharmD Clinical Pharmacist [facility]

## 2016-04-29 ENCOUNTER — Inpatient Hospital Stay (HOSPITAL_COMMUNITY): Payer: Medicare Other

## 2016-04-29 DIAGNOSIS — R1010 Upper abdominal pain, unspecified: Secondary | ICD-10-CM

## 2016-04-29 LAB — BASIC METABOLIC PANEL
ANION GAP: 6 (ref 5–15)
BUN: 21 mg/dL — ABNORMAL HIGH (ref 6–20)
CALCIUM: 8.5 mg/dL — AB (ref 8.9–10.3)
CO2: 29 mmol/L (ref 22–32)
Chloride: 104 mmol/L (ref 101–111)
Creatinine, Ser: 1.77 mg/dL — ABNORMAL HIGH (ref 0.61–1.24)
GFR, EST AFRICAN AMERICAN: 45 mL/min — AB (ref >60)
GFR, EST NON AFRICAN AMERICAN: 39 mL/min — AB (ref >60)
Glucose, Bld: 164 mg/dL — ABNORMAL HIGH (ref 65–99)
POTASSIUM: 3.7 mmol/L (ref 3.5–5.1)
SODIUM: 139 mmol/L (ref 135–145)

## 2016-04-29 LAB — CBC
HCT: 28.4 % — ABNORMAL LOW (ref 39.0–52.0)
Hemoglobin: 8.4 g/dL — ABNORMAL LOW (ref 13.0–17.0)
MCH: 26.5 pg (ref 26.0–34.0)
MCHC: 29.6 g/dL — ABNORMAL LOW (ref 30.0–36.0)
MCV: 89.6 fL (ref 78.0–100.0)
PLATELETS: 320 10*3/uL (ref 150–400)
RBC: 3.17 MIL/uL — AB (ref 4.22–5.81)
RDW: 15 % (ref 11.5–15.5)
WBC: 9.8 10*3/uL (ref 4.0–10.5)

## 2016-04-29 LAB — TRIGLYCERIDES: Triglycerides: 172 mg/dL — ABNORMAL HIGH (ref <150)

## 2016-04-29 LAB — GLUCOSE, CAPILLARY
GLUCOSE-CAPILLARY: 132 mg/dL — AB (ref 65–99)
GLUCOSE-CAPILLARY: 114 mg/dL — AB (ref 65–99)
Glucose-Capillary: 158 mg/dL — ABNORMAL HIGH (ref 65–99)
Glucose-Capillary: 166 mg/dL — ABNORMAL HIGH (ref 65–99)

## 2016-04-29 LAB — PHOSPHORUS: PHOSPHORUS: 3.7 mg/dL (ref 2.5–4.6)

## 2016-04-29 LAB — MAGNESIUM: MAGNESIUM: 1.9 mg/dL (ref 1.7–2.4)

## 2016-04-29 MED ORDER — SODIUM CHLORIDE 0.9% FLUSH: 10.0000 mL | INTRAVENOUS | Status: DC | PRN

## 2016-04-29 MED ORDER — CHLORHEXIDINE GLUCONATE CLOTH 2 % EX PADS
6.0000 | MEDICATED_PAD | Freq: Every day | CUTANEOUS | Status: DC
  Administered 2016-04-30 – 2016-05-13 (×15): 6 via TOPICAL

## 2016-04-29 MED ORDER — PROPOFOL 1000 MG/100ML IV EMUL
0.0000 ug/kg/min | INTRAVENOUS | Status: DC
  Administered 2016-04-29: 30 ug/kg/min via INTRAVENOUS
  Administered 2016-04-29: 20 ug/kg/min via INTRAVENOUS
  Administered 2016-04-29: 50 ug/kg/min via INTRAVENOUS
  Administered 2016-04-29: 45 ug/kg/min via INTRAVENOUS
  Administered 2016-04-30 (×7): 50 ug/kg/min via INTRAVENOUS
  Administered 2016-04-30: 40 ug/kg/min via INTRAVENOUS
  Administered 2016-04-30 – 2016-05-02 (×16): 50 ug/kg/min via INTRAVENOUS
  Administered 2016-05-02: 25 ug/kg/min via INTRAVENOUS
  Administered 2016-05-02 (×3): 50 ug/kg/min via INTRAVENOUS
  Filled 2016-04-29 (×13): qty 100
  Filled 2016-04-29: qty 200
  Filled 2016-04-29 (×10): qty 100
  Filled 2016-04-29: qty 200
  Filled 2016-04-29 (×8): qty 100

## 2016-04-29 MED ORDER — CALCIUM GLUCONATE 10 % IV SOLN
INTRAVENOUS | Status: AC
  Administered 2016-04-29: 18:00:00 via INTRAVENOUS
  Filled 2016-04-29: qty 2400

## 2016-04-29 MED ORDER — MIDAZOLAM HCL 2 MG/2ML IJ SOLN
1.0000 mg | INTRAMUSCULAR | Status: DC | PRN
  Administered 2016-04-29 (×4): 2 mg via INTRAVENOUS
  Administered 2016-04-29: 6 mg via INTRAVENOUS
  Administered 2016-04-29 (×2): 4 mg via INTRAVENOUS
  Administered 2016-04-30: 2 mg via INTRAVENOUS
  Administered 2016-04-30 (×2): 4 mg via INTRAVENOUS
  Administered 2016-04-30 – 2016-05-01 (×2): 2 mg via INTRAVENOUS
  Administered 2016-05-01: 4 mg via INTRAVENOUS
  Administered 2016-05-01 (×2): 2 mg via INTRAVENOUS
  Administered 2016-05-02: 6 mg via INTRAVENOUS
  Administered 2016-05-03 (×4): 4 mg via INTRAVENOUS
  Administered 2016-05-03: 2 mg via INTRAVENOUS
  Administered 2016-05-04: 4 mg via INTRAVENOUS
  Administered 2016-05-04: 2 mg via INTRAVENOUS
  Administered 2016-05-05: 6 mg via INTRAVENOUS
  Administered 2016-05-05 (×2): 4 mg via INTRAVENOUS
  Administered 2016-05-05: 2 mg via INTRAVENOUS
  Administered 2016-05-05: 6 mg via INTRAVENOUS
  Administered 2016-05-05 (×3): 4 mg via INTRAVENOUS
  Administered 2016-05-09 – 2016-05-12 (×5): 2 mg via INTRAVENOUS
  Administered 2016-05-13: 4 mg via INTRAVENOUS
  Filled 2016-04-29 (×3): qty 2
  Filled 2016-04-29: qty 4
  Filled 2016-04-29 (×2): qty 2
  Filled 2016-04-29: qty 4
  Filled 2016-04-29: qty 2
  Filled 2016-04-29 (×3): qty 4
  Filled 2016-04-29 (×2): qty 2
  Filled 2016-04-29 (×3): qty 4
  Filled 2016-04-29: qty 2
  Filled 2016-04-29: qty 4
  Filled 2016-04-29: qty 2
  Filled 2016-04-29: qty 4
  Filled 2016-04-29: qty 2
  Filled 2016-04-29: qty 4
  Filled 2016-04-29: qty 2
  Filled 2016-04-29 (×2): qty 6
  Filled 2016-04-29: qty 2
  Filled 2016-04-29: qty 4
  Filled 2016-04-29: qty 6
  Filled 2016-04-29 (×2): qty 4
  Filled 2016-04-29: qty 6
  Filled 2016-04-29: qty 2
  Filled 2016-04-29 (×3): qty 4
  Filled 2016-04-29 (×2): qty 2
  Filled 2016-04-29 (×2): qty 4

## 2016-04-29 MED ORDER — SODIUM CHLORIDE 0.9% FLUSH
10.0000 mL | Freq: Two times a day (BID) | INTRAVENOUS | Status: DC
  Administered 2016-04-30 – 2016-05-13 (×26): 10 mL

## 2016-04-29 MED ORDER — SODIUM CHLORIDE 0.9 % IV SOLN
30.0000 meq | Freq: Two times a day (BID) | INTRAVENOUS | Status: AC
  Administered 2016-04-29 (×2): 30 meq via INTRAVENOUS
  Filled 2016-04-29 (×2): qty 15

## 2016-04-29 MED ORDER — MAGNESIUM SULFATE 2 GM/50ML IV SOLN
2.0000 g | Freq: Once | INTRAVENOUS | Status: AC
  Administered 2016-04-29: 2 g via INTRAVENOUS
  Filled 2016-04-29: qty 50

## 2016-04-29 NOTE — Progress Notes (Signed)
eLink Physician-Brief Progress Note Patient Name: Larry [name] DOB: Jul 11, 1950 MRN: [id number]   Date of Service  04/29/2016  HPI/Events of Note  Continued agitation despite max fentanyl at 400 mcg, hourly versed at 4 mcg and precedex at 1.2  eICU Interventions  Plan: Keep fentanyl at current dose Increase precedex to 1.7 Increase PRN versed to 6 mcg max     Intervention Category Major Interventions: Delirium, psychosis, severe agitation - evaluation and management  DETERDING,ELIZABETH 04/29/2016, 12:27 AM

## 2016-04-29 NOTE — Consult Note (Addendum)
[facility] Nurse ostomy consult note Surgical team is following for assessment and plan of care to abd wound. Pt is familiar with ostomy care, since he previously had a colostomy, according to the EMR Stoma type/location: Ileostomy stoma red and viable, slightly above skin level, 1 3/4 inches Peristomal assessment: Intact skin surrounding Output: No stool or flatus at this time, scant amt pink drainage in pouch Ostomy pouching: 1pc.  Education provided:  Pt is intubated and sedated and no family members are present.  [place] team members will plan to begin teaching sessions when stable and out of ICU. Supplies at the the bedside for staff nurse use. Enrolled patient in [employer] program: No Julien Girt MSN, [place], [place], [place], [place]

## 2016-04-29 NOTE — Progress Notes (Signed)
PHARMACY - ADULT TOTAL PARENTERAL NUTRITION CONSULT NOTE   Pharmacy Consult:  TPN Indication:  Prolonged ileus  Patient Measurements: Height: 5' 10.5" (179.1 cm) Weight: (!) 391 lb (177.4 kg) IBW/kg (Calculated) : 74.15 TPN AdjBW (KG): 101.4 Body mass index is 55.31 kg/m.  Assessment:  72 YOM with history of an obstructing tumor s/p emergent colectomy with colostomy of right transverse colon in 2014.  Patient presented on 04/12/16 for reversal of colostomy.  Postoperatively, he had emesis on 04/14/16 and a distended abdomen on 04/15/16.  He has consumed </= 10% of his meals for 7 days and Pharmacy consulted to manage TPN for prolonged ileus. Patient reports eating regularly and gained 3-4 lbs before admission.  GI: s/p ex-lap and repair of leak on 2/27. Abd distended and illeostomy not draining per surgery. CT 3/8 shows possible mild small bowel obstruction and small volume of abd free fluid. Surgery reports no urgent intervention required. LBM 2/28. NGT output 882mL 3/5 > 40 mL 3/8. Prealbumin 21 >> 10.4 d/t critical illness. Albumin 2 > 1.5. Simethicone, PPI IV.  Endo: No hx DM, A1c 5.5% - CBGs controlled Insulin requirements in the past 24 hours: 4 units of SSI Lytes: all WNL, CorCal 10.5, phos 3.7 (replaced 3/5-3/8). K 3.7, Mag 1.9.  Renal: CKD3 - SCr stable 1.77, BUN 21 - UOP inc d/t lasix 0.5 >2.3 ml/kg/hr, 1/2NS at [facility]. Flomax, bethanechol. Started lasix 80 IV BID 3/8 x4 doses.  Pulm: intubated 2/27 - FiO2 30%.  Extubation when mentation improves.  Cards: CM / CHF (EF 55-60%) / CAD post PCI / HLD / HTN / PAD / hx atrial tachycardia and torsades. BP labile (102-585I systolic), HR also labile (50-100s)- metoprolol IV, ASA PR. CVP 18, +5L since adm.  Hepatobil: LFTs / tbili WNL. TG mildly elevated at 183 Neuro: High dose fentanyl, transitioning precedex back to propofol 3/9. GCS 13, CPOT 0-6, RASS -2 (goal -2). Significant versed PRN needs (38 mg).   ID: Zosyn/Eraxis for sepsis+  peritonitis.  Now afebrile, WBC WNL. Cultures NGTD. Heme/Onc: Hx colon cancer - hgb 8.4 stable, plts normalized Best Practices: heparin SQ, CHG/mupirocin TPN Access: PICC placed 04/18/16 TPN start date: 04/18/16  Nutritional Goals: per RD recs on 3/6 7782-4235 kCal and >/= 187 gm protein per day Clinimix 5/15 goal rate 125 mL/hr to provide 100% kcall and 80% protein needs daily   Current Nutrition:  Clinimix 5/15 with electrolyte additives + propofol (at ~ 21 mL/hr provides 554 kcal/day) Pivot 1.5 at 20 mL/hr 3/2 >> 3/5, off d/t distended abd  Plan:  - Decrease Clinimix 5/15 with electrolyte additives to 100 mL/hr due to additional calories received from propofol. Hold lipids while patient receiving propofol for sedation.  - Clinimix + propofol provides 2258 kCal and 120 g of protein per day, meeting 100% of kCal and 64% of protein needs.  - Daily multivitamin in TPN - Trace elements every other day, next dose tomorrow 3/10 - Continue sensitive SSI Q6H - Mag 2g IV x1  - KCl 30 meq IV x2 - Daily Phos per MD, BMP/mag in AM - Monitor I&O, volume status  - F/U propofol use, resuming TF, contrast study through ileostomy per surgery  Argie Ramming, PharmD Pharmacy Resident  Pager [phone] 04/29/16 7:24 AM   Gracy Bruins, PharmD Clinical Pharmacist [facility]

## 2016-04-29 NOTE — Progress Notes (Signed)
Excess fentanyl iv in bag 35ml wasted in sink with Scientist, research (life sciences).

## 2016-04-29 NOTE — Progress Notes (Signed)
GS Progress Note Subjective: CT scan from yesterday does n ot show any finding that require immediate operative interventiion.  The contrast did not make it all the way into the ileostomy  Objective: Vital signs in last 24 hours: Temp:  [98 F (36.7 C)-99.1 F (37.3 C)] 98.6 F (37 C) (03/09 0358) Pulse Rate:  [52-138] 94 (03/09 0700) Resp:  [0-29] 18 (03/09 0700) BP: (104-212)/(69-119) 160/95 (03/09 0700) SpO2:  [92 %-100 %] 92 % (03/09 0700) FiO2 (%):  [30 %] 30 % (03/09 0400) Last BM Date: 04/20/16  Intake/Output from previous day: 03/08 0701 - 03/09 0700 In: 5652.9 [P.O.:600; I.V.:4482.9; NG/GT:40; IV Piggyback:530] Out: 49675 [Urine:9875; Emesis/NG output:150; Stool:45] Intake/Output this shift: No intake/output data recorded.  Lungs: Clear, FIO2 30%, sats are 91-92%.  No distress, but gets agitated easily  Abd: Distended with hypoactive bowel sounds.  Extremities: No changes  Neuro: Sedated   Lab Results: CBC   Recent Labs  04/28/16 0406 04/29/16 0316  WBC 9.1 9.8  HGB 8.3* 8.4*  HCT 28.4* 28.4*  PLT 293 320   BMET  Recent Labs  04/28/16 0406 04/29/16 0316  NA 142 139  K 4.1 3.7  CL 113* 104  CO2 25 29  GLUCOSE 155* 164*  BUN 19 21*  CREATININE 1.79* 1.77*  CALCIUM 8.3* 8.5*   PT/INR No results for input(s): LABPROT, INR in the last 72 hours. ABG  Recent Labs  04/27/16 1030  PHART 7.315*  HCO3 25.5    Studies/Results: Ct Abdomen Pelvis Wo Contrast  Result Date: 04/28/2016 CLINICAL DATA:  66 year old male status post multiple colon surgeries - most recently exploratory laparotomy with repair of anastomotic leak following a colostomy reversal, and placement of loop ileostomy on 04/19/2016. Possible bowel obstruction. EXAM: CT ABDOMEN AND PELVIS WITHOUT CONTRAST TECHNIQUE: Multidetector CT imaging of the abdomen and pelvis was performed following the standard protocol without IV contrast. COMPARISON:  CT Abdomen and Pelvis 02/08/2013.  Chest  CT 11/14/2015 FINDINGS: Lower chest: Moderate to large bilateral layering pleural effusions with compressive atelectasis. Cardiomegaly.  No pericardial effusion. Hepatobiliary: Negative noncontrast liver and gallbladder. Pancreas: Negative. Spleen: Negative. Adrenals/Urinary Tract: Negative adrenal glands. Noncontrast kidneys appears stable. Diminutive urinary bladder which contains a Foley catheter. Stomach/Bowel: Oral contrast administered via an NG tube which terminates in the body of the stomach. Oral contrast has not yet reached the mid small bowel. There is a double barrel ostomy in the right mid abdomen which appears to be at the level of the distal small bowel. The terminal ileum is decompressed as is the cecum (coronal image 61). Residual contrast or radiodense tablets are noted at the cecum. The appendix is poorly delineated today, and there is a small volume of free fluid about the cecum tracking toward the midline. I transverse colon level anastomosis is suspected on series 7, image 38 and there is a nearby a percutaneous surgical drain. There is mild inflammatory stranding in this region. The large bowel is largely decompressed throughout. There is diverticulosis from the splenic flexure to the sigmoid colon. There is a small volume of retained stool in the rectum. Upper limits of normal mid abdominal small bowel loops with air-fluid levels. No transition point. No pneumoperitoneum. Vascular/Lymphatic: Extensive Aortoiliac calcified atherosclerosis noted. Vascular patency is not evaluated in the absence of IV contrast. Reproductive: Negative. Other: No pelvic free fluid. Along the ventral right superior abdominal wall just caudal to the percutaneous drain entry site there is a 5 cm area of soft tissue thickening  or fluid collection associated with the abdominal wall musculature (series 7, image 39). There are overlying skin staples. Medial to this there is a ventral abdominal wound healing by secondary  intention. There is mild to moderate generalized subcutaneous stranding in the ventral abdomen. No abnormal gas. Musculoskeletal: No acute osseous abnormality identified. IMPRESSION: 1. Moderate to large bilateral layering pleural effusions with lower lobe atelectasis. No definite pneumonia. 2. Right abdominal ileostomy. Oral contrast administered via the NG tube has not yet reached the mid jejunum, and mid abdominal small bowel loops measure at the upper limits of normal and contain fluid levels. These findings could reflect a mild small bowel obstruction but no transition point is identified. 3. Percutaneous drain in the upper abdomen. Situated between the drain entry site and the midline abdominal wound there is an area 5.4 cm soft tissue thickening or fluid along the ventral right abdominal wall musculature (series 7, image 39). 4. Small volume of abdominal free fluid including about the cecum. No pneumoperitoneum. 5.  Calcified aortic atherosclerosis. Electronically Signed   By: Genevie Ann M.D.   On: 04/28/2016 14:47   Dg Chest Port 1 View  Result Date: 04/29/2016 CLINICAL DATA:  Right jugular central line not flushing. EXAM: PORTABLE CHEST 1 VIEW COMPARISON:  04/24/2016 FINDINGS: The right jugular line superimposes the soft tissues of the neck and does not extend into the chest. Left upper extremity PICC line terminates at the expected location of the SVC - azygos vein junction. Endotracheal tube is at the level of the clavicular heads. Nasogastric tube extends to the lower chest and beyond the inferior edge of the image. Opacities are present in both bases. Lung bases are poorly imaged on this study due to prioritizing positioning and technique for the right jugular line. IMPRESSION: 1. The right jugular central line does not reach the central circulation of the chest. It terminates in the soft tissues of the neck. 2. Left subclavian central line appears satisfactorily positioned. ET tube tip is at the level  of the clavicular heads. NG tube extends into the lower chest and beyond the inferior edge of the image. 3. Probable airspace opacities in both lung bases, limited evaluation. Electronically Signed   By: Andreas Newport M.D.   On: 04/29/2016 01:19    Anti-infectives: Anti-infectives    Start     Dose/Rate Route Frequency Ordered Stop   04/21/16 1000  anidulafungin (ERAXIS) 100 mg in sodium chloride 0.9 % 100 mL IVPB     100 mg over 90 Minutes Intravenous Every 24 hours 04/20/16 1008     04/20/16 1045  anidulafungin (ERAXIS) 200 mg in sodium chloride 0.9 % 200 mL IVPB     200 mg over 180 Minutes Intravenous  Once 04/20/16 1008 04/20/16 1422   04/19/16 2030  piperacillin-tazobactam (ZOSYN) IVPB 3.375 g     3.375 g 12.5 mL/hr over 240 Minutes Intravenous Every 8 hours 04/19/16 1958     04/19/16 1230  cefoTEtan in Dextrose 5% (CEFOTAN) 2-2.08 GM-% IVPB    Comments:  Ivar Drape   : cabinet override      04/19/16 1230 04/19/16 1245   04/12/16 2000  cefoTEtan (CEFOTAN) 2 g in dextrose 5 % 50 mL IVPB     2 g 100 mL/hr over 30 Minutes Intravenous Every 12 hours 04/12/16 1120 04/12/16 2125   04/12/16 0700  cefoTEtan (CEFOTAN) 2 g in dextrose 5 % 50 mL IVPB     2 g 100 mL/hr over 30 Minutes Intravenous  To [facility] 04/11/16 1404 04/12/16 0753   04/12/16 0548  cefoTEtan in Dextrose 5% (CEFOTAN) 2-2.08 GM-% IVPB    Comments:  Precious Haws   : cabinet override      04/12/16 0548 04/12/16 1759   04/12/16 0544  neomycin (MYCIFRADIN) tablet 1,000 mg  Status:  Discontinued     1,000 mg Oral 3 times per day 04/12/16 0544 04/12/16 0547   04/12/16 0544  metroNIDAZOLE (FLAGYL) tablet 1,000 mg  Status:  Discontinued     1,000 mg Oral 3 times per day 04/12/16 0544 04/12/16 0547      Assessment/Plan: s/p Procedure(s): EXPLORATORY LAPAROTOMY, REPAIR OF ANASTAMOTIC LEAK COLOSTOMY Current sedation protocol not effective according to the nurse so I have changed the Precedex to Propofol.  If  this does not work will have to consider Versed drip.  Will probably have to get a contrast study through the ileostomy to determine if there is a blockage.  LOS: 17 days    Kathryne Eriksson. Dahlia Bailiff, MD, FACS [phone] [facility] 04/29/2016

## 2016-04-29 NOTE — Progress Notes (Signed)
[facility] Attending Note  Presenting HPI:  66 y.o. 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 Presented on 2/20 for reversal of colostomy , developed recurrent abdominal pain on 2/26 and taken back to the OR on 2/27 finding free air with peritonitis and anastomotic leakage. Patient returned from the operating room on ventilator and with ongoing shock.  IMAGING/STUDIES: TTE 2/24: LV normal in size with moderate LVH. EF 55-60% with no regional wall motion abnormalities.    MICROBIOLOGY: Blood Cultures x2 2/27 >>>ng Tracheal Aspirate Culture 2/28 >>> nml flora MRSA PCR 2/28:  Negative  Blood Cultures x2 3/2 >>> Urine Culture 3/2 >>>ng  ANTIBIOTICS: Cefotan 2/20 (periop prophylaxis) Zosyn 2/27 >>> Eraxis 2/28 >>>  SIGNIFICANT EVENTS: 02/20 - Admit for colostomy reversal 02/23 - Postop ileus & tachycardia 02/24 - Cardiology Consulted 02/27 - OR for Exploratory Laparotomy finding anastomotic leak & peritonitis  3/2   transitioned to Precedex infusion and  fentanyl drip, febrile  Subjective:   Some frothy secretions To prop Ct done, no intervention needed per surgery  Neg 4 liters  Vent Mode: PSV;CPAP FiO2 (%):  [30 %-50 %] 50 % Set Rate:  [14 bmp] 14 bmp Vt Set:  [580 mL] 580 mL PEEP:  [5 cmH20] 5 cmH20 Pressure Support:  [12 cmH20] 12 cmH20 Plateau Pressure:  [25 cmH20-27 cmH20] 25 cmH20  Temp:  [98 F (36.7 C)-99.1 F (37.3 C)] 98.3 F (36.8 C) (03/09 0908) Pulse Rate:  [52-138] 83 (03/09 0737) Resp:  [0-23] 15 (03/09 0737) BP: (104-212)/(69-119) 169/107 (03/09 0737) SpO2:  [92 %-100 %] 99 % (03/09 0737) FiO2 (%):  [30 %-50 %] 50 % (03/09 0737)  Gen.:  Morbidly obese. Sedated, intubated. NAD Skin: no rash Genitourinary: Foley catheter in place Pulmonary: ronchi improved , to some frothy secretions Cardiovascular: 1 s2 RRR Neurological: rass  -2, does fc Abd-distended , ostomy  pink wnl, low BS. Soft, remains distended.   LINES/TUBES: OETT 7.5 2/27 >>> R IJ CVL 2/27 >>> LUE DL PICC 2/26 >>> Foley >>> L NGT 2/27 >>> L RADIAL ART LINE 2/27 >>> discontinued PIV  CBC Latest Ref Rng & Units 04/29/2016 04/28/2016 04/27/2016  WBC 4.0 - 10.5 K/uL 9.8 9.1 8.0  Hemoglobin 13.0 - 17.0 g/dL 8.4(L) 8.3(L) 8.4(L)  Hematocrit 39.0 - 52.0 % 28.4(L) 28.4(L) 28.6(L)  Platelets 150 - 400 K/uL 320 293 275   BMP Latest Ref Rng & Units 04/29/2016 04/28/2016 04/27/2016  Glucose 65 - 99 mg/dL 164(H) 155(H) 147(H)  BUN 6 - 20 mg/dL 21(H) 19 20  Creatinine 0.61 - 1.24 mg/dL 1.77(H) 1.79(H) 1.70(H)  Sodium 135 - 145 mmol/L 139 142 142  Potassium 3.5 - 5.1 mmol/L 3.7 4.1 4.1  Chloride 101 - 111 mmol/L 104 113(H) 111  CO2 22 - 32 mmol/L 29 25 24   Calcium 8.9 - 10.3 mg/dL 8.5(L) 8.3(L) 8.3(L)   Hepatic Function Latest Ref Rng & Units 04/28/2016 04/25/2016 04/23/2016  Total Protein 6.5 - 8.1 g/dL 5.4(L) 5.4(L) -  Albumin 3.5 - 5.0 g/dL 1.5(L) 1.6(L) 1.6(L)  AST 15 - 41 U/L 24 25 -  ALT 17 - 63 U/L 26 28 -  Alk Phosphatase 38 - 126 U/L 81 74 -  Total Bilirubin 0.3 - 1.2 mg/dL 0.5 0.4 -  Bilirubin, Direct <=0.2 mg/dL - - -      ASSESSMENT/PLAN:  66 y.o. male, initially admitted for reversal of ostomy, ended up with peritonitis from bowel anastomotic leak.  Off pressors & improving renal function. TF stopped on 3/5  because of concern for abd distention. More abdominal distention since 3/6. Not acitvely weaning 2/2 abdominal issues.   1. S/P Septic shock (resolved) secondary to peritonitis.  Continuing broad-spectrum coverage with Zosyn and Eraxis. Cultures remain (-). Abdomen is noted to be distended since 3/6.  TF held on 3/5.  For CT this am, TPN needed, CT done, no contrast all the way through, may need CT in am to assess when contrast goes through- per surgery 2. Acute on chronic hypoxemic and hypercarbic respiratory failure: Known underlying OSA/OHS.  Neg balance has improved pcxr, less edema  bases, maintgain as tolerated, pcxr in am, PS 12 needed for wenaing, goal 4 hours 3. Agitation/Acute encephalopathy/delirium - improved, agree attempt prop, dc precedex, WUA in full 4. Acute on chronic stage III renal failure, edema- neg 4 liters and did well, maintain, chem in am  5. Hyperglycemia: with glu in am bmet, add ssi cbg 6. History of coronary artery disease: ASA daily    Ccm time 35 min   Lavon Paganini. Titus Mould, MD, [place] Pgr: [facility]

## 2016-04-30 ENCOUNTER — Inpatient Hospital Stay (HOSPITAL_COMMUNITY): Payer: Medicare Other

## 2016-04-30 DIAGNOSIS — K56609 Unspecified intestinal obstruction, unspecified as to partial versus complete obstruction: Secondary | ICD-10-CM

## 2016-04-30 LAB — BASIC METABOLIC PANEL
Anion gap: 9 (ref 5–15)
BUN: 24 mg/dL — ABNORMAL HIGH (ref 6–20)
CHLORIDE: 100 mmol/L — AB (ref 101–111)
CO2: 30 mmol/L (ref 22–32)
CREATININE: 2.02 mg/dL — AB (ref 0.61–1.24)
Calcium: 8.5 mg/dL — ABNORMAL LOW (ref 8.9–10.3)
GFR calc non Af Amer: 33 mL/min — ABNORMAL LOW (ref >60)
GFR, EST AFRICAN AMERICAN: 39 mL/min — AB (ref >60)
Glucose, Bld: 139 mg/dL — ABNORMAL HIGH (ref 65–99)
Potassium: 3.6 mmol/L (ref 3.5–5.1)
Sodium: 139 mmol/L (ref 135–145)

## 2016-04-30 LAB — CBC
HEMATOCRIT: 28.9 % — AB (ref 39.0–52.0)
HEMOGLOBIN: 8.4 g/dL — AB (ref 13.0–17.0)
MCH: 26.4 pg (ref 26.0–34.0)
MCHC: 29.1 g/dL — AB (ref 30.0–36.0)
MCV: 90.9 fL (ref 78.0–100.0)
Platelets: 364 10*3/uL (ref 150–400)
RBC: 3.18 MIL/uL — ABNORMAL LOW (ref 4.22–5.81)
RDW: 15.2 % (ref 11.5–15.5)
WBC: 9.7 10*3/uL (ref 4.0–10.5)

## 2016-04-30 LAB — GLUCOSE, CAPILLARY
GLUCOSE-CAPILLARY: 108 mg/dL — AB (ref 65–99)
GLUCOSE-CAPILLARY: 122 mg/dL — AB (ref 65–99)
Glucose-Capillary: 138 mg/dL — ABNORMAL HIGH (ref 65–99)
Glucose-Capillary: 124 mg/dL — ABNORMAL HIGH (ref 65–99)

## 2016-04-30 LAB — PHOSPHORUS: PHOSPHORUS: 3.7 mg/dL (ref 2.5–4.6)

## 2016-04-30 LAB — MAGNESIUM: Magnesium: 2 mg/dL (ref 1.7–2.4)

## 2016-04-30 MED ORDER — TRACE MINERALS CR-CU-MN-SE-ZN 10-1000-500-60 MCG/ML IV SOLN
INTRAVENOUS | Status: DC
  Filled 2016-04-30 (×2): qty 2400

## 2016-04-30 MED ORDER — TRACE MINERALS CR-CU-MN-SE-ZN 10-1000-500-60 MCG/ML IV SOLN
INTRAVENOUS | Status: AC
  Administered 2016-04-30: 18:00:00 via INTRAVENOUS
  Filled 2016-04-30 (×2): qty 2400

## 2016-04-30 MED ORDER — IOPAMIDOL (ISOVUE-300) INJECTION 61%
INTRAVENOUS | Status: AC
  Filled 2016-04-30: qty 300

## 2016-04-30 MED ORDER — POTASSIUM CHLORIDE 2 MEQ/ML IV SOLN
30.0000 meq | Freq: Once | INTRAVENOUS | Status: AC
  Administered 2016-04-30: 30 meq via INTRAVENOUS
  Filled 2016-04-30: qty 15

## 2016-04-30 NOTE — Progress Notes (Signed)
PHARMACY - ADULT TOTAL PARENTERAL NUTRITION CONSULT NOTE   Pharmacy Consult for TPN Indication: Prolonged ileus  Patient Measurements: Height: 5' 10.5" (179.1 cm) Weight: (!) 391 lb (177.4 kg) IBW/kg (Calculated) : 74.15 TPN AdjBW (KG): 101.4 Body mass index is 55.31 kg/m.  Assessment:  37 YOM with history of an obstructing tumor s/p emergent colectomy with colostomy of right transverse colon in 2014.  Patient presented on 04/12/16 for reversal of colostomy.  Postoperatively, he had emesis on 04/14/16 and a distended abdomen on 04/15/16.  He has consumed </= 10% of his meals for 7 days and Pharmacy consulted to manage TPN for prolonged ileus. Patient reports eating regularly and gained 3-4 lbs before admission.  GI: s/p ex-lap and repair of leak on 2/27. CT 3/8 shows possible mild small bowel obstruction and small volume of abd free fluid.  Cont'd abd distention but ileostomy output this AM- will hold on further contrast study.  LBM 2/28. NGT output 210 increased. Prealbumin 21 >> 10.4 d/t critical illness. Albumin 2 > 1.5. Simethicone, PPI IV.  Endo: No hx DM, A1c 5.5% - CBGs 108-166 Insulin requirements in the past 24 hours: 4 units of SSI Lytes: all WNL, CorCal 10.5, phos 3.7 (replaced 3/5-3/8). K 3.6- dec s/p K 72mEq x 2 with last dose Lasix 3/9 PM, Mag 2.  Renal: CKD3 - Cr 2.02 increased, BUN 24 - UOP 7.6L, I/O (-)2L.   1/2NS at [facility]. Flomax, bethanechol.  Completed 4 doses Lasix 3/9, no active order. Pulm: intubated 2/27 - FiO2 30%.  Extubation when mentation improves.  Cards: CM / CHF (EF 55-60%) / CAD post PCI / HLD / HTN / PAD / hx atrial tachycardia and torsades. BP labile (762-831D systolic), HR also labile (50-100s)- metoprolol IV, ASA PR. CVP 18, +5L since adm.  Hepatobil: LFTs / tbili WNL. TG mildly elevated at 183 Neuro: High dose Fentanyl 427mcg/hr, Propofol 49.77mcg/kg/min. Agitation improved with Propofol.   ID: Zosyn/Eraxis for sepsis+ peritonitis, plan to stop 3/12.  Now  afebrile, WBC WNL. Cultures NGTD, new blood cx 3/9. Heme/Onc: Hx colon cancer - hgb 8.4 stable, plts normalized Best Practices: heparin SQ, CHG/mupirocin TPN Access: PICC placed 04/18/16 TPN start date: 04/18/16  Nutritional Goals: per RD recs on 3/6 1761-6073 kCal and >/= 187 gm protein per day Clinimix 5/15 goal rate 125 mL/hr to provide 100% kcal and 80% protein needs daily  Current Nutrition:  Clinimix 5/15 with electrolyte additives providing 1704kcal 3/10:  Propofol- total 835ml last 24hr, providing 885 fat kcal **Clinimix 5/15 with electolyte additives + Propofol currently meeting 110% of caloric goals and 64% of protein goals  Plan:  - Continue Clinimix 5/15 with electrolyte additives at 100 mL/hr, hesitant to back off further as only meeting 65% protein goals, glucose controlled, & just over caloric goals - Hold lipids while patient receiving propofol for sedation.  - Daily multivitamin in TPN - Trace elements every other day, next dose tomorrow 3/10 - Change SSI to q4 - KCl 30 meq IV x 1- cautiously replacing as Lasix now off and Cr up some - Daily Phos per MD, BMP/Mg in AM - Monitor I&O, volume status    Gracy Bruins, PharmD [facility]

## 2016-04-30 NOTE — Progress Notes (Signed)
11 Days Post-Op  Subjective: Pt on vent sedated   Objective: Vital signs in last 24 hours: Temp:  [98 F (36.7 C)-99.9 F (37.7 C)] 99.9 F (37.7 C) (03/10 0832) Pulse Rate:  [57-146] 146 (03/10 0900) Resp:  [0-25] 22 (03/10 0900) BP: (90-178)/(46-124) 170/86 (03/10 0900) SpO2:  [93 %-100 %] 95 % (03/10 0911) FiO2 (%):  [35 %-50 %] 35 % (03/10 0911) Last BM Date: 04/20/16  Intake/Output from previous day: 03/09 0701 - 03/10 0700 In: 5766.5 [I.V.:4891.5; NG/GT:210; IV Piggyback:665] Out: 7705 [Urine:7650; Drains:5; Stool:50] Intake/Output this shift: No intake/output data recorded.  Incision/Wound:open clean  Ileostomy pink with bilious output noted   Lab Results:   Recent Labs  04/29/16 0316 04/30/16 0411  WBC 9.8 9.7  HGB 8.4* 8.4*  HCT 28.4* 28.9*  PLT 320 364   BMET  Recent Labs  04/29/16 0316 04/30/16 0411  NA 139 139  K 3.7 3.6  CL 104 100*  CO2 29 30  GLUCOSE 164* 139*  BUN 21* 24*  CREATININE 1.77* 2.02*  CALCIUM 8.5* 8.5*   PT/INR No results for input(s): LABPROT, INR in the last 72 hours. ABG  Recent Labs  04/27/16 1030  PHART 7.315*  HCO3 25.5    Studies/Results: Ct Abdomen Pelvis Wo Contrast  Result Date: 04/28/2016 CLINICAL DATA:  66 year old male status post multiple colon surgeries - most recently exploratory laparotomy with repair of anastomotic leak following a colostomy reversal, and placement of loop ileostomy on 04/19/2016. Possible bowel obstruction. EXAM: CT ABDOMEN AND PELVIS WITHOUT CONTRAST TECHNIQUE: Multidetector CT imaging of the abdomen and pelvis was performed following the standard protocol without IV contrast. COMPARISON:  CT Abdomen and Pelvis 02/08/2013.  Chest CT 11/14/2015 FINDINGS: Lower chest: Moderate to large bilateral layering pleural effusions with compressive atelectasis. Cardiomegaly.  No pericardial effusion. Hepatobiliary: Negative noncontrast liver and gallbladder. Pancreas: Negative. Spleen: Negative.  Adrenals/Urinary Tract: Negative adrenal glands. Noncontrast kidneys appears stable. Diminutive urinary bladder which contains a Foley catheter. Stomach/Bowel: Oral contrast administered via an NG tube which terminates in the body of the stomach. Oral contrast has not yet reached the mid small bowel. There is a double barrel ostomy in the right mid abdomen which appears to be at the level of the distal small bowel. The terminal ileum is decompressed as is the cecum (coronal image 61). Residual contrast or radiodense tablets are noted at the cecum. The appendix is poorly delineated today, and there is a small volume of free fluid about the cecum tracking toward the midline. I transverse colon level anastomosis is suspected on series 7, image 38 and there is a nearby a percutaneous surgical drain. There is mild inflammatory stranding in this region. The large bowel is largely decompressed throughout. There is diverticulosis from the splenic flexure to the sigmoid colon. There is a small volume of retained stool in the rectum. Upper limits of normal mid abdominal small bowel loops with air-fluid levels. No transition point. No pneumoperitoneum. Vascular/Lymphatic: Extensive Aortoiliac calcified atherosclerosis noted. Vascular patency is not evaluated in the absence of IV contrast. Reproductive: Negative. Other: No pelvic free fluid. Along the ventral right superior abdominal wall just caudal to the percutaneous drain entry site there is a 5 cm area of soft tissue thickening or fluid collection associated with the abdominal wall musculature (series 7, image 39). There are overlying skin staples. Medial to this there is a ventral abdominal wound healing by secondary intention. There is mild to moderate generalized subcutaneous stranding in the ventral abdomen.  No abnormal gas. Musculoskeletal: No acute osseous abnormality identified. IMPRESSION: 1. Moderate to large bilateral layering pleural effusions with lower lobe  atelectasis. No definite pneumonia. 2. Right abdominal ileostomy. Oral contrast administered via the NG tube has not yet reached the mid jejunum, and mid abdominal small bowel loops measure at the upper limits of normal and contain fluid levels. These findings could reflect a mild small bowel obstruction but no transition point is identified. 3. Percutaneous drain in the upper abdomen. Situated between the drain entry site and the midline abdominal wound there is an area 5.4 cm soft tissue thickening or fluid along the ventral right abdominal wall musculature (series 7, image 39). 4. Small volume of abdominal free fluid including about the cecum. No pneumoperitoneum. 5.  Calcified aortic atherosclerosis. Electronically Signed   By: Genevie Ann M.D.   On: 04/28/2016 14:47   Dg Chest Port 1 View  Result Date: 04/30/2016 CLINICAL DATA:  Pulmonary edema EXAM: PORTABLE CHEST 1 VIEW COMPARISON:  04/29/2016 FINDINGS: Endotracheal tube with the tip 5 cm above the carina. Nasogastric tube coursing below the diaphragm. Left-sided PICC line with the tip projecting over the SVC. Bilateral small pleural effusions. Bilateral interstitial thickening. No pneumothorax. No focal consolidation. Stable cardiomegaly. IMPRESSION: 1. Findings most consistent with mild CHF. 2. Support lines and tubing in satisfactory position. Electronically Signed   By: Kathreen Devoid   On: 04/30/2016 09:09   Dg Chest Port 1 View  Result Date: 04/29/2016 CLINICAL DATA:  Acute respiratory failure, intubated EXAM: PORTABLE CHEST 1 VIEW COMPARISON:  Chest radiograph from earlier today. FINDINGS: Endotracheal tube tip is 6.9 cm above the carina. Enteric tube enters stomach with the tip not seen on this image. Left PICC terminates in the upper third of the superior vena cava. Stable cardiomediastinal silhouette with mild cardiomegaly and aortic atherosclerosis. No pneumothorax. Stable small bilateral pleural effusions. Stable mild pulmonary edema. IMPRESSION:  1. Endotracheal tube tip is 6.9 cm above the carina just below thoracic inlet . 2. Enteric tube enters the stomach with the tip not seen on this image . 3. No pneumothorax. 4. Mild congestive heart failure, stable . 5. Stable small bilateral pleural effusions. 6. Aortic atherosclerosis. Electronically Signed   By: Ilona Sorrel M.D.   On: 04/29/2016 09:12   Dg Chest Port 1 View  Result Date: 04/29/2016 CLINICAL DATA:  Right jugular central line not flushing. EXAM: PORTABLE CHEST 1 VIEW COMPARISON:  04/24/2016 FINDINGS: The right jugular line superimposes the soft tissues of the neck and does not extend into the chest. Left upper extremity PICC line terminates at the expected location of the SVC - azygos vein junction. Endotracheal tube is at the level of the clavicular heads. Nasogastric tube extends to the lower chest and beyond the inferior edge of the image. Opacities are present in both bases. Lung bases are poorly imaged on this study due to prioritizing positioning and technique for the right jugular line. IMPRESSION: 1. The right jugular central line does not reach the central circulation of the chest. It terminates in the soft tissues of the neck. 2. Left subclavian central line appears satisfactorily positioned. ET tube tip is at the level of the clavicular heads. NG tube extends into the lower chest and beyond the inferior edge of the image. 3. Probable airspace opacities in both lung bases, limited evaluation. Electronically Signed   By: Andreas Newport M.D.   On: 04/29/2016 01:19    Anti-infectives: Anti-infectives    Start  Dose/Rate Route Frequency Ordered Stop   04/21/16 1000  anidulafungin (ERAXIS) 100 mg in sodium chloride 0.9 % 100 mL IVPB     100 mg over 90 Minutes Intravenous Every 24 hours 04/20/16 1008 05/02/16 2359   04/20/16 1045  anidulafungin (ERAXIS) 200 mg in sodium chloride 0.9 % 200 mL IVPB     200 mg over 180 Minutes Intravenous  Once 04/20/16 1008 04/20/16 1422    04/19/16 2030  piperacillin-tazobactam (ZOSYN) IVPB 3.375 g     3.375 g 12.5 mL/hr over 240 Minutes Intravenous Every 8 hours 04/19/16 1958 05/02/16 2359   04/19/16 1230  cefoTEtan in Dextrose 5% (CEFOTAN) 2-2.08 GM-% IVPB    Comments:  Ivar Drape   : cabinet override      04/19/16 1230 04/19/16 1245   04/12/16 2000  cefoTEtan (CEFOTAN) 2 g in dextrose 5 % 50 mL IVPB     2 g 100 mL/hr over 30 Minutes Intravenous Every 12 hours 04/12/16 1120 04/12/16 2125   04/12/16 0700  cefoTEtan (CEFOTAN) 2 g in dextrose 5 % 50 mL IVPB     2 g 100 mL/hr over 30 Minutes Intravenous To ShortStay Surgical 04/11/16 1404 04/12/16 0753   04/12/16 0548  cefoTEtan in Dextrose 5% (CEFOTAN) 2-2.08 GM-% IVPB    Comments:  Precious Haws   : cabinet override      04/12/16 0548 04/12/16 1759   04/12/16 0544  neomycin (MYCIFRADIN) tablet 1,000 mg  Status:  Discontinued     1,000 mg Oral 3 times per day 04/12/16 0544 04/12/16 0547   04/12/16 0544  metroNIDAZOLE (FLAGYL) tablet 1,000 mg  Status:  Discontinued     1,000 mg Oral 3 times per day 04/12/16 0544 04/12/16 0547      Assessment/Plan: s/p Procedure(s): EXPLORATORY LAPAROTOMY, REPAIR OF ANASTAMOTIC LEAK (N/A) COLOSTOMY (N/A) Output in ileostomy noted  Hold on contrast study   KUB this am Continue supportive care for now Would stop ABX after 14 days   LOS: 18 days    Letrice Pollok A. 04/30/2016

## 2016-04-30 NOTE — Progress Notes (Signed)
[facility] Attending Note  Presenting HPI:  66 y.o. 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 Presented on 2/20 for reversal of colostomy , developed recurrent abdominal pain on 2/26 and taken back to the OR on 2/27 finding free air with peritonitis and anastomotic leakage. Patient returned from the operating room on ventilator and with ongoing shock.  IMAGING/STUDIES: TTE 2/24: LV normal in size with moderate LVH. EF 55-60% with no regional wall motion abnormalities.    MICROBIOLOGY: Blood Cultures x2 2/27 >>>ng Tracheal Aspirate Culture 2/28 >>> nml flora MRSA PCR 2/28:  Negative  Blood Cultures x2 3/2 >>> Urine Culture 3/2 >>>ng  ANTIBIOTICS: Cefotan 2/20 (periop prophylaxis) Zosyn 2/27 >>> Eraxis 2/28 >>>  SIGNIFICANT EVENTS: 02/20 - Admit for colostomy reversal 02/23 - Postop ileus & tachycardia 02/24 - Cardiology Consulted 02/27 - OR for Exploratory Laparotomy finding anastomotic leak & peritonitis  3/2   transitioned to Precedex infusion and  fentanyl drip, febrile  Subjective:   This am tachy, poor weaning Better sedation with propofol Neg 2 liters last 24 hours  Vent Mode: PRVC FiO2 (%):  [35 %-50 %] 35 % Set Rate:  [14 bmp] 14 bmp Vt Set:  [580 mL] 580 mL PEEP:  [5 cmH20] 5 cmH20 Pressure Support:  [5 QZR00-76 cmH20] 5 cmH20 Plateau Pressure:  [21 cmH20-28 cmH20] 28 cmH20  Temp:  [98 F (36.7 C)-99.9 F (37.7 C)] 99.9 F (37.7 C) (03/10 0832) Pulse Rate:  [57-146] 146 (03/10 0900) Resp:  [0-25] 22 (03/10 0900) BP: (90-178)/(46-124) 170/86 (03/10 0900) SpO2:  [93 %-100 %] 95 % (03/10 0911) FiO2 (%):  [35 %-50 %] 35 % (03/10 0911)  General: rass now -2 Neuro: with wake up fc, moves all ext HEENT: perrl PULM: less crcakles, ronchi, reduced bases CV: s1 s2 rrr resolved tachy GI: soft, low BS, wound , ostomy wnl Extremities: edema   LINES/TUBES: OETT 7.5 2/27 >>> R  IJ CVL 2/27 >>> LUE DL PICC 2/26 >>> Foley >>> L NGT 2/27 >>> L RADIAL ART LINE 2/27 >>> discontinued PIV  CBC Latest Ref Rng & Units 04/30/2016 04/29/2016 04/28/2016  WBC 4.0 - 10.5 K/uL 9.7 9.8 9.1  Hemoglobin 13.0 - 17.0 g/dL 8.4(L) 8.4(L) 8.3(L)  Hematocrit 39.0 - 52.0 % 28.9(L) 28.4(L) 28.4(L)  Platelets 150 - 400 K/uL 364 320 293   BMP Latest Ref Rng & Units 04/30/2016 04/29/2016 04/28/2016  Glucose 65 - 99 mg/dL 139(H) 164(H) 155(H)  BUN 6 - 20 mg/dL 24(H) 21(H) 19  Creatinine 0.61 - 1.24 mg/dL 2.02(H) 1.77(H) 1.79(H)  Sodium 135 - 145 mmol/L 139 139 142  Potassium 3.5 - 5.1 mmol/L 3.6 3.7 4.1  Chloride 101 - 111 mmol/L 100(L) 104 113(H)  CO2 22 - 32 mmol/L 30 29 25   Calcium 8.9 - 10.3 mg/dL 8.5(L) 8.5(L) 8.3(L)   Hepatic Function Latest Ref Rng & Units 04/28/2016 04/25/2016 04/23/2016  Total Protein 6.5 - 8.1 g/dL 5.4(L) 5.4(L) -  Albumin 3.5 - 5.0 g/dL 1.5(L) 1.6(L) 1.6(L)  AST 15 - 41 U/L 24 25 -  ALT 17 - 63 U/L 26 28 -  Alk Phosphatase 38 - 126 U/L 81 74 -  Total Bilirubin 0.3 - 1.2 mg/dL 0.5 0.4 -  Bilirubin, Direct <=0.2 mg/dL - - -      ASSESSMENT/PLAN:  66 y.o. male, initially admitted for reversal of ostomy, ended up with peritonitis from bowel anastomotic leak. Off pressors & improving renal function. TF  stopped on 3/5  because of concern for abd distention. More abdominal distention since 3/6. Not acitvely weaning 2/2 abdominal issues.   1. S/P Septic shock (resolved) secondary to peritonitis.  Continuing broad-spectrum coverage with Zosyn and Eraxis. Cultures remain (-). Abdomen is noted to be distended since 3/6.  TF held on 3/5.  For CT this am, TPN needed, CT done, no contrast all the way through:  For xray abdo per surgery to assess contrast from CT, will add stop dates all antimicrobials for Monday then re assess 2. Acute on chronic hypoxemic and hypercarbic respiratory failure: have max neg balance on lasix, crt rise, cvp down, dc lasix, chem in am , pcxr in am ,  weaning may need to statr at higher PS as got tachy and agitationb, PS goal 10 3. Agitation/Acute encephalopathy/delirium - imrpoved with prop, dc prec from mar, daily wua 4. Acute on chronic stage III renal failure,we have benefitied lasix, dc , chem in a, kvo 5. Hyperglycemia: with glu in am bmet, add ssi cbg 6. History of coronary artery disease: ASA daily  D/w CCS  Ccm time 30 min   Lavon Paganini. Titus Mould, MD, [employer] Pgr: [facility]

## 2016-05-01 ENCOUNTER — Inpatient Hospital Stay (HOSPITAL_COMMUNITY): Payer: Medicare Other

## 2016-05-01 LAB — COMPREHENSIVE METABOLIC PANEL
ALK PHOS: 103 U/L (ref 38–126)
ALT: 38 U/L (ref 17–63)
AST: 35 U/L (ref 15–41)
Albumin: 1.8 g/dL — ABNORMAL LOW (ref 3.5–5.0)
Anion gap: 8 (ref 5–15)
BUN: 24 mg/dL — AB (ref 6–20)
CALCIUM: 8.7 mg/dL — AB (ref 8.9–10.3)
CHLORIDE: 102 mmol/L (ref 101–111)
CO2: 29 mmol/L (ref 22–32)
CREATININE: 1.92 mg/dL — AB (ref 0.61–1.24)
GFR calc Af Amer: 41 mL/min — ABNORMAL LOW (ref >60)
GFR calc non Af Amer: 35 mL/min — ABNORMAL LOW (ref >60)
Glucose, Bld: 157 mg/dL — ABNORMAL HIGH (ref 65–99)
Potassium: 4 mmol/L (ref 3.5–5.1)
SODIUM: 139 mmol/L (ref 135–145)
Total Bilirubin: 0.7 mg/dL (ref 0.3–1.2)
Total Protein: 6.5 g/dL (ref 6.5–8.1)

## 2016-05-01 LAB — CULTURE, RESPIRATORY

## 2016-05-01 LAB — GLUCOSE, CAPILLARY
GLUCOSE-CAPILLARY: 122 mg/dL — AB (ref 65–99)
GLUCOSE-CAPILLARY: 130 mg/dL — AB (ref 65–99)
GLUCOSE-CAPILLARY: 129 mg/dL — AB (ref 65–99)

## 2016-05-01 LAB — CBC WITH DIFFERENTIAL/PLATELET
BASOS PCT: 0 %
Basophils Absolute: 0 10*3/uL (ref 0.0–0.1)
EOS ABS: 0.2 10*3/uL (ref 0.0–0.7)
EOS PCT: 2 %
HCT: 28.7 % — ABNORMAL LOW (ref 39.0–52.0)
Hemoglobin: 8.3 g/dL — ABNORMAL LOW (ref 13.0–17.0)
LYMPHS ABS: 0.9 10*3/uL (ref 0.7–4.0)
Lymphocytes Relative: 9 %
MCH: 26.5 pg (ref 26.0–34.0)
MCHC: 28.9 g/dL — AB (ref 30.0–36.0)
MCV: 91.7 fL (ref 78.0–100.0)
MONO ABS: 0.8 10*3/uL (ref 0.1–1.0)
MONOS PCT: 8 %
Neutro Abs: 8.4 10*3/uL — ABNORMAL HIGH (ref 1.7–7.7)
Neutrophils Relative %: 81 %
PLATELETS: 331 10*3/uL (ref 150–400)
RBC: 3.13 MIL/uL — ABNORMAL LOW (ref 4.22–5.81)
RDW: 15.7 % — AB (ref 11.5–15.5)
WBC: 10.2 10*3/uL (ref 4.0–10.5)

## 2016-05-01 MED ORDER — CLINIMIX E/DEXTROSE (5/15) 5 % IV SOLN
INTRAVENOUS | Status: AC
  Administered 2016-05-01: 19:00:00 via INTRAVENOUS
  Filled 2016-05-01: qty 1800

## 2016-05-01 MED ORDER — VITAL AF 1.2 CAL PO LIQD
1000.0000 mL | ORAL | Status: DC
  Administered 2016-05-01: 1000 mL

## 2016-05-01 NOTE — Progress Notes (Signed)
12 Days Post-Op  Subjective: No acute events.  Objective: Vital signs in last 24 hours: Temp:  [98.5 F (36.9 C)-99 F (37.2 C)] 98.5 F (36.9 C) (03/11 0800) Pulse Rate:  [88-146] 99 (03/11 0800) Resp:  [9-22] 13 (03/11 0800) BP: (99-178)/(56-92) 116/66 (03/11 0824) SpO2:  [90 %-97 %] 96 % (03/11 0800) FiO2 (%):  [35 %-40 %] 40 % (03/11 0824) Weight:  [188.7 kg (416 lb)] 188.7 kg (416 lb) (03/11 0700) Last BM Date: 04/30/16  Intake/Output from previous day: 03/10 0701 - 03/11 0700 In: 5084.4 [I.V.:4569.4; NG/GT:150; IV Piggyback:365] Out: 3570 [Urine:2475; Emesis/NG output:425; Drains:5; Stool:140] Intake/Output this shift: No intake/output data recorded. Sedated, intubated. Lungs: coarse BS bilaterally. Incision/Wound:   Dressing c/d/i.  Ileostomy pink with bilious output noted   Lab Results:   Recent Labs  04/30/16 0411 05/01/16 0344  WBC 9.7 10.2  HGB 8.4* 8.3*  HCT 28.9* 28.7*  PLT 364 331   BMET  Recent Labs  04/30/16 0411 05/01/16 0344  NA 139 139  K 3.6 4.0  CL 100* 102  CO2 30 29  GLUCOSE 139* 157*  BUN 24* 24*  CREATININE 2.02* 1.92*  CALCIUM 8.5* 8.7*   PT/INR No results for input(s): LABPROT, INR in the last 72 hours. ABG No results for input(s): PHART, HCO3 in the last 72 hours.  Invalid input(s): PCO2, PO2  Studies/Results: Dg Chest Port 1 View  Result Date: 05/01/2016 CLINICAL DATA:  66 year old male status post multiple colon surgeries - most recently exploratory laparotomy with repair of anastomotic leak following a colostomy reversal, and placement of loop ileostomy on 04/19/2016. Moderate to large bilateral pleural effusions on recent CT Abdomen and Pelvis. EXAM: PORTABLE CHEST 1 VIEW COMPARISON:  04/30/2016 and earlier. FINDINGS: Portable AP semi upright view at 0651 hours. Stable endotracheal tube tip below the clavicles. Enteric tube courses to the abdomen, tip not included. Stable cardiomegaly and mediastinal contours. Continued  veiling opacity compatible with the pleural effusions seen on the recent CT. No superimposed pneumothorax. No definite consolidation. Pulmonary vascular congestion without overt edema. IMPRESSION: 1.  Stable lines and tubes. 2. Bilateral pleural effusions with atelectasis. Pulmonary vascular congestion without overt edema. Electronically Signed   By: Genevie Ann M.D.   On: 05/01/2016 08:24   Dg Chest Port 1 View  Result Date: 04/30/2016 CLINICAL DATA:  Pulmonary edema EXAM: PORTABLE CHEST 1 VIEW COMPARISON:  04/29/2016 FINDINGS: Endotracheal tube with the tip 5 cm above the carina. Nasogastric tube coursing below the diaphragm. Left-sided PICC line with the tip projecting over the SVC. Bilateral small pleural effusions. Bilateral interstitial thickening. No pneumothorax. No focal consolidation. Stable cardiomegaly. IMPRESSION: 1. Findings most consistent with mild CHF. 2. Support lines and tubing in satisfactory position. Electronically Signed   By: Kathreen Devoid   On: 04/30/2016 09:09   Dg Abd Portable 1v  Result Date: 04/30/2016 CLINICAL DATA:  OG tube placement. EXAM: PORTABLE ABDOMEN - 1 VIEW COMPARISON:  None. FINDINGS: An OG tube is identified with tip overlying the distal stomach. A percutaneous catheter overlying the right abdomen is faintly visualized. IMPRESSION: OG tube with tip overlying the distal stomach. Electronically Signed   By: Margarette Canada M.D.   On: 04/30/2016 19:01    Anti-infectives: Anti-infectives    Start     Dose/Rate Route Frequency Ordered Stop   04/21/16 1000  anidulafungin (ERAXIS) 100 mg in sodium chloride 0.9 % 100 mL IVPB     100 mg over 90 Minutes Intravenous Every 24 hours  04/20/16 1008 05/02/16 2359   04/20/16 1045  anidulafungin (ERAXIS) 200 mg in sodium chloride 0.9 % 200 mL IVPB     200 mg over 180 Minutes Intravenous  Once 04/20/16 1008 04/20/16 1422   04/19/16 2030  piperacillin-tazobactam (ZOSYN) IVPB 3.375 g     3.375 g 12.5 mL/hr over 240 Minutes  Intravenous Every 8 hours 04/19/16 1958 05/02/16 2359   04/19/16 1230  cefoTEtan in Dextrose 5% (CEFOTAN) 2-2.08 GM-% IVPB    Comments:  Ivar Drape   : cabinet override      04/19/16 1230 04/19/16 1245   04/12/16 2000  cefoTEtan (CEFOTAN) 2 g in dextrose 5 % 50 mL IVPB     2 g 100 mL/hr over 30 Minutes Intravenous Every 12 hours 04/12/16 1120 04/12/16 2125   04/12/16 0700  cefoTEtan (CEFOTAN) 2 g in dextrose 5 % 50 mL IVPB     2 g 100 mL/hr over 30 Minutes Intravenous To ShortStay Surgical 04/11/16 1404 04/12/16 0753   04/12/16 0548  cefoTEtan in Dextrose 5% (CEFOTAN) 2-2.08 GM-% IVPB    Comments:  Precious Haws   : cabinet override      04/12/16 0548 04/12/16 1759   04/12/16 0544  neomycin (MYCIFRADIN) tablet 1,000 mg  Status:  Discontinued     1,000 mg Oral 3 times per day 04/12/16 0544 04/12/16 0547   04/12/16 0544  metroNIDAZOLE (FLAGYL) tablet 1,000 mg  Status:  Discontinued     1,000 mg Oral 3 times per day 04/12/16 0544 04/12/16 0547      Assessment/Plan: s/p Procedure(s): EXPLORATORY LAPAROTOMY, REPAIR OF ANASTAMOTIC LEAK (N/A) COLOSTOMY (N/A) Output in ileostomy noted  Add tube feeds.  Continue TNA until tube feeds at goal. Continue supportive care for now Would stop ABX after 14 days    LOS: 19 days    [facility] 05/01/2016

## 2016-05-01 NOTE — Progress Notes (Signed)
PHARMACY - ADULT TOTAL PARENTERAL NUTRITION CONSULT NOTE   Pharmacy Consult for TPN Indication: Prolonged ileus  Patient Measurements: Height: 5' 10.5" (179.1 cm) Weight: (!) 416 lb (188.7 kg) IBW/kg (Calculated) : 74.15 TPN AdjBW (KG): 101.4 Body mass index is 58.85 kg/m.  Assessment: 34 YOM with history of an obstructing tumor s/p emergent colectomy with colostomy of right transverse colon in 2014. Patient presented on 04/12/16 for reversal of colostomy. Postoperatively, he had emesis on 04/14/16 and a distended abdomen on 04/15/16. He has consumed </= 10% of his meals for 7 days and Pharmacy consulted to manage TPN for prolonged ileus. Patient reports eating regularly and gained 3-4 lbs before admission.  GI:s/p ex-lap and repair of leak on 2/27. CT 3/8 shows possible mild small bowel obstruction and small volume of abd free fluid.  LBM 2/28. Ileostomy 140, NGT output 425 increased. Prealbumin 21 >>10.4 d/t critical illness. Albumin 2 >1.8. Simethicone, PPI IV, Vital AF 1.2 at 1ml/hr ordered 3/11 Endo:No hx DM, A1c 5.5% - CBGs 108-124 Insulin requirements in the past 24 hours:5units of SSI Lytes:all WNL, CorCal 10.5,  K 4 (KCl 56mEq x 1 3/10, last dose Lasix 3/10 PM), Mag 2, Phos 3.7(replaced 3/5-3/8) at last check.  Renal:CKD3 - Cr 1.92 ~stable, BUN 24- UOP 2.5L decreased, I/O 2L.   1/2[facility] at [facility]. Flomax, bethanechol.  S/P Lasix x 4 doses, ended 3/9. Pulm:intubated 2/27 - FiO2 30%. Extubation when mentation improves.  Cards:CM / CHF (EF 55-60%) / CAD post PCI / HLD / HTN / PAD / hx atrial tachycardia and torsades. BP & HR labile- metoprolol IV, ASA PR. CVP 18, +5L since adm.  Hepatobil:LFTs / tbili WNL. TG mildly elevated at 183 Neuro:High dose Fentanyl 483mcg/hr, Propofol 49.48mcg/kg/min. Agitation improved with Propofol vs Precedex.  VO:HYWVP/XTGGYI for sepsis+ peritonitis, plan 14 days. Now afebrile, WBC WNL. Cultures NGTD, new blood cx 3/9. Heme/Onc: Hx colon  cancer - hgb 8.4 stable, plts normalized Best Practices:heparin SQ, CHG/mupirocin TPN Access: PICC placed 04/18/16 TPN start date:04/18/16  Nutritional Goals: per RD recs on 3/6 9485-4627 kCal and >/= 187 gm protein per day Clinimix 5/15 goal rate 125 mL/hr to provide 100% kcal and 80% protein needs daily  Current Nutrition:  Clinimix 5/15 with electrolyte additives providing 1704kcal, 120g protein 3/10:  Propofol- total 1264ml last 24hr, providing 1345 fat kcal **Clinimix 5/15 with electolyte additives + Propofol currently meeting 130% of caloric goals and 64% of protein goals (3/10)  Plan: Decrease Clinimix5/15 with electrolyte additives to 56mL/hr.  In combination with Propofol, providing 2623kcal (110%) and 90g protein (50%) Hold lipids while patient receiving propofol for sedation.  Daily multivitamin in TPN Trace elements every other day, next dose tomorrow 3/12 Cont SSI q4 Watch renal function and volume status TPN labs in AM F/U tolerance of TF and ability to wean TPN   Gracy Bruins, PharmD Clinical Pharmacist [facility]

## 2016-05-01 NOTE — Plan of Care (Signed)
Problem: Activity: Goal: Risk for activity intolerance will decrease Outcome: Not Progressing Pt is on bedrest, unable to cooperate to mobilize, ROM done but passive  Problem: Fluid Volume: Goal: Ability to maintain a balanced intake and output will improve Outcome: Progressing Pt requiring diuresis, still third spacing  Problem: Nutrition: Goal: Adequate nutrition will be maintained Outcome: Progressing Pt started on trickle tube feeds, TNA for nutritional needs  Problem: Bowel/Gastric: Goal: Will not experience complications related to bowel motility Outcome: Progressing Beginning to produce from ileostomy  Problem: Coping: Goal: Level of anxiety will decrease Outcome: Not Progressing Pt requires significant sedation  Problem: Skin Integrity Impairment Risk: Goal: Risk for impaired skin integrity will decrease Outcome: Progressing Pt on bariatric air bed, turning q2, good skin and circulation assessments Goal: Skin integrity will improve Outcome: Progressing Wound care as ordered

## 2016-05-01 NOTE — Progress Notes (Signed)
[facility] Attending Note  Presenting HPI:  66 y.o. 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 Presented on 2/20 for reversal of colostomy , developed recurrent abdominal pain on 2/26 and taken back to the OR on 2/27 finding free air with peritonitis and anastomotic leakage. Patient returned from the operating room on ventilator and with ongoing shock.  IMAGING/STUDIES: TTE 2/24: LV normal in size with moderate LVH. EF 55-60% with no regional wall motion abnormalities.    MICROBIOLOGY: Blood Cultures x2 2/27 >>>ng Tracheal Aspirate Culture 2/28 >>> nml flora MRSA PCR 2/28:  Negative  Blood Cultures x2 3/2 >>> Urine Culture 3/2 >>>ng  ANTIBIOTICS: Cefotan 2/20 (periop prophylaxis) Zosyn 2/27 >>>for 14 days Eraxis 2/28 >>>for 14 days  SIGNIFICANT EVENTS: 02/20 - Admit for colostomy reversal 02/23 - Postop ileus & tachycardia 02/24 - Cardiology Consulted 02/27 - OR for Exploratory Laparotomy finding anastomotic leak & peritonitis  3/2   transitioned to Precedex infusion and  fentanyl drip, febrile  Subjective:   Ps 12 done TF trickle  Vent Mode: PRVC FiO2 (%):  [35 %-40 %] 40 % Set Rate:  [14 bmp] 14 bmp Vt Set:  [580 mL] 580 mL PEEP:  [5 cmH20] 5 cmH20 Plateau Pressure:  [16 cmH20-28 cmH20] 28 cmH20  Temp:  [98.5 F (36.9 C)-99.8 F (37.7 C)] 99.8 F (37.7 C) (03/11 1100) Pulse Rate:  [88-131] 117 (03/11 1202) Resp:  [9-19] 19 (03/11 1202) BP: (99-163)/(56-90) 155/79 (03/11 1202) SpO2:  [90 %-96 %] 96 % (03/11 1202) FiO2 (%):  [35 %-40 %] 40 % (03/11 1202) Weight:  [188.7 kg (416 lb)] 188.7 kg (416 lb) (03/11 0700)  General: rass-2 Neuro: with wake up fc, moves all ext HEENT: perrl PULM: coarse CV: s1 s2 rrr resolved tachy GI: soft, low BS, wound , ostomy wnl, output low Extremities: edema   LINES/TUBES: OETT 7.5 2/27 >>> R IJ CVL 2/27 >>> LUE DL PICC 2/26 >>> Foley  >>> L NGT 2/27 >>> L RADIAL ART LINE 2/27 >>> discontinued PIV  CBC Latest Ref Rng & Units 05/01/2016 04/30/2016 04/29/2016  WBC 4.0 - 10.5 K/uL 10.2 9.7 9.8  Hemoglobin 13.0 - 17.0 g/dL 8.3(L) 8.4(L) 8.4(L)  Hematocrit 39.0 - 52.0 % 28.7(L) 28.9(L) 28.4(L)  Platelets 150 - 400 K/uL 331 364 320   BMP Latest Ref Rng & Units 05/01/2016 04/30/2016 04/29/2016  Glucose 65 - 99 mg/dL 157(H) 139(H) 164(H)  BUN 6 - 20 mg/dL 24(H) 24(H) 21(H)  Creatinine 0.61 - 1.24 mg/dL 1.92(H) 2.02(H) 1.77(H)  Sodium 135 - 145 mmol/L 139 139 139  Potassium 3.5 - 5.1 mmol/L 4.0 3.6 3.7  Chloride 101 - 111 mmol/L 102 100(L) 104  CO2 22 - 32 mmol/L 29 30 29   Calcium 8.9 - 10.3 mg/dL 8.7(L) 8.5(L) 8.5(L)   Hepatic Function Latest Ref Rng & Units 05/01/2016 04/28/2016 04/25/2016  Total Protein 6.5 - 8.1 g/dL 6.5 5.4(L) 5.4(L)  Albumin 3.5 - 5.0 g/dL 1.8(L) 1.5(L) 1.6(L)  AST 15 - 41 U/L 35 24 25  ALT 17 - 63 U/L 38 26 28  Alk Phosphatase 38 - 126 U/L 103 81 74  Total Bilirubin 0.3 - 1.2 mg/dL 0.7 0.5 0.4  Bilirubin, Direct <=0.2 mg/dL - - -      ASSESSMENT/PLAN:  66 y.o. male, initially admitted for reversal of ostomy, ended up with peritonitis from bowel anastomotic leak. Off pressors & improving renal function. TF stopped on 3/5  because of  concern for abd distention. More abdominal distention since 3/6. Not acitvely weaning 2/2 abdominal issues.   1. S/P Septic shock (resolved) secondary to peritonitis.  Continuing broad-spectrum coverage with Zosyn and Eraxis. Cultures remain (-). Abdomen is noted to be distended since 3/6.  TF held on 3/5.  For CT this am, TPN needed, CT done,, stop dates for abx in place 2. Acute on chronic hypoxemic and hypercarbic respiratory failure: effusions likely, edema, but had crt rise,. thereof ere lasix held and crt better, was pos 2 liters, may need restart lasix in 24 hours for even goals not netagative 3. PS 12 done, he needs trach likely early this week, can re assess wenaing sp in  afternoon, may need [place] rt base chest 4. Agitation/Acute encephalopathy/delirium - prop with wua, upright as able , can we do chair position? Up to ccs 5. Acute on chronic stage III renal failure, lasix hled crt down, continued to hold, butmay need restart to even , as was pos 2 liters 6. Hyperglycemia: with glu in am bmet, add ssi cbg 7. History of coronary artery disease: ASA daily   Ccm time 30 min   Lavon Paganini. Titus Mould, MD, [place] Pgr: [facility]

## 2016-05-02 ENCOUNTER — Inpatient Hospital Stay (HOSPITAL_COMMUNITY): Payer: Medicare Other

## 2016-05-02 LAB — CBC
HCT: 25.6 % — ABNORMAL LOW (ref 39.0–52.0)
HEMOGLOBIN: 7.5 g/dL — AB (ref 13.0–17.0)
MCH: 27.1 pg (ref 26.0–34.0)
MCHC: 29.3 g/dL — ABNORMAL LOW (ref 30.0–36.0)
MCV: 92.4 fL (ref 78.0–100.0)
PLATELETS: 307 10*3/uL (ref 150–400)
RBC: 2.77 MIL/uL — AB (ref 4.22–5.81)
RDW: 15.6 % — ABNORMAL HIGH (ref 11.5–15.5)
WBC: 7.6 10*3/uL (ref 4.0–10.5)

## 2016-05-02 LAB — GLUCOSE, CAPILLARY
GLUCOSE-CAPILLARY: 128 mg/dL — AB (ref 65–99)
GLUCOSE-CAPILLARY: 135 mg/dL — AB (ref 65–99)
GLUCOSE-CAPILLARY: 144 mg/dL — AB (ref 65–99)
GLUCOSE-CAPILLARY: 151 mg/dL — AB (ref 65–99)
Glucose-Capillary: 152 mg/dL — ABNORMAL HIGH (ref 65–99)

## 2016-05-02 LAB — COMPREHENSIVE METABOLIC PANEL
ALBUMIN: 1.7 g/dL — AB (ref 3.5–5.0)
ALT: 49 U/L (ref 17–63)
ANION GAP: 6 (ref 5–15)
AST: 50 U/L — AB (ref 15–41)
Alkaline Phosphatase: 103 U/L (ref 38–126)
BILIRUBIN TOTAL: 0.5 mg/dL (ref 0.3–1.2)
BUN: 23 mg/dL — AB (ref 6–20)
CHLORIDE: 104 mmol/L (ref 101–111)
CO2: 31 mmol/L (ref 22–32)
Calcium: 8.5 mg/dL — ABNORMAL LOW (ref 8.9–10.3)
Creatinine, Ser: 1.76 mg/dL — ABNORMAL HIGH (ref 0.61–1.24)
GFR calc Af Amer: 46 mL/min — ABNORMAL LOW (ref >60)
GFR, EST NON AFRICAN AMERICAN: 39 mL/min — AB (ref >60)
Glucose, Bld: 147 mg/dL — ABNORMAL HIGH (ref 65–99)
Potassium: 4.4 mmol/L (ref 3.5–5.1)
Sodium: 141 mmol/L (ref 135–145)
TOTAL PROTEIN: 5.7 g/dL — AB (ref 6.5–8.1)

## 2016-05-02 LAB — PHOSPHORUS: Phosphorus: 3.3 mg/dL (ref 2.5–4.6)

## 2016-05-02 LAB — DIFFERENTIAL
BASOS ABS: 0 10*3/uL (ref 0.0–0.1)
Basophils Relative: 0 %
Eosinophils Absolute: 0.1 10*3/uL (ref 0.0–0.7)
Eosinophils Relative: 1 %
LYMPHS ABS: 0.7 10*3/uL (ref 0.7–4.0)
LYMPHS PCT: 9 %
Monocytes Absolute: 0.9 10*3/uL (ref 0.1–1.0)
Monocytes Relative: 12 %
NEUTROS ABS: 5.9 10*3/uL (ref 1.7–7.7)
Neutrophils Relative %: 78 %

## 2016-05-02 LAB — PREALBUMIN: PREALBUMIN: 15.1 mg/dL — AB (ref 18–38)

## 2016-05-02 LAB — MAGNESIUM: MAGNESIUM: 2.2 mg/dL (ref 1.7–2.4)

## 2016-05-02 LAB — TRIGLYCERIDES: TRIGLYCERIDES: 246 mg/dL — AB (ref <150)

## 2016-05-02 MED ORDER — PRO-STAT SUGAR FREE PO LIQD
60.0000 mL | Freq: Three times a day (TID) | ORAL | Status: DC
  Administered 2016-05-02 – 2016-05-13 (×34): 60 mL
  Filled 2016-05-02 (×33): qty 60

## 2016-05-02 MED ORDER — VITAL AF 1.2 CAL PO LIQD
1000.0000 mL | ORAL | Status: DC
  Administered 2016-05-02 – 2016-05-12 (×14): 1000 mL
  Filled 2016-05-02 (×7): qty 1000

## 2016-05-02 MED ORDER — DEXMEDETOMIDINE HCL IN NACL 400 MCG/100ML IV SOLN
0.4000 ug/kg/h | INTRAVENOUS | Status: DC
  Administered 2016-05-02 (×4): 2 ug/kg/h via INTRAVENOUS
  Administered 2016-05-02 (×2): 1.2 ug/kg/h via INTRAVENOUS
  Administered 2016-05-02 – 2016-05-05 (×42): 2 ug/kg/h via INTRAVENOUS
  Administered 2016-05-05: 1 ug/kg/h via INTRAVENOUS
  Administered 2016-05-05 (×2): 2 ug/kg/h via INTRAVENOUS
  Administered 2016-05-05: 1 ug/kg/h via INTRAVENOUS
  Administered 2016-05-05 (×4): 2 ug/kg/h via INTRAVENOUS
  Administered 2016-05-05: 1 ug/kg/h via INTRAVENOUS
  Administered 2016-05-05 (×4): 2 ug/kg/h via INTRAVENOUS
  Administered 2016-05-05: 1.5 ug/kg/h via INTRAVENOUS
  Administered 2016-05-05: 2 ug/kg/h via INTRAVENOUS
  Administered 2016-05-05: 1 ug/kg/h via INTRAVENOUS
  Administered 2016-05-05 (×2): 2 ug/kg/h via INTRAVENOUS
  Administered 2016-05-05: 1.5 ug/kg/h via INTRAVENOUS
  Administered 2016-05-06 (×4): 2 ug/kg/h via INTRAVENOUS
  Administered 2016-05-06 (×2): 1.8 ug/kg/h via INTRAVENOUS
  Filled 2016-05-02 (×10): qty 100
  Filled 2016-05-02: qty 200
  Filled 2016-05-02: qty 50
  Filled 2016-05-02 (×34): qty 100
  Filled 2016-05-02: qty 50
  Filled 2016-05-02 (×3): qty 100
  Filled 2016-05-02: qty 50
  Filled 2016-05-02 (×16): qty 100
  Filled 2016-05-02: qty 150
  Filled 2016-05-02 (×6): qty 100

## 2016-05-02 MED ORDER — TRACE MINERALS CR-CU-MN-SE-ZN 10-1000-500-60 MCG/ML IV SOLN
INTRAVENOUS | Status: AC
  Administered 2016-05-02: 18:00:00 via INTRAVENOUS
  Filled 2016-05-02: qty 960

## 2016-05-02 MED ORDER — LABETALOL HCL 5 MG/ML IV SOLN
20.0000 mg | INTRAVENOUS | Status: AC | PRN
  Administered 2016-05-02 – 2016-05-04 (×10): 20 mg via INTRAVENOUS
  Filled 2016-05-02 (×11): qty 4

## 2016-05-02 MED ORDER — VITAL HIGH PROTEIN PO LIQD: 1000.0000 mL | ORAL | Status: DC

## 2016-05-02 MED ORDER — TRACE MINERALS CR-CU-MN-SE-ZN 10-1000-500-60 MCG/ML IV SOLN
INTRAVENOUS | Status: DC
  Filled 2016-05-02: qty 960

## 2016-05-02 NOTE — Progress Notes (Signed)
Nutrition Consult/Follow Up  DOCUMENTATION CODES:   Morbid obesity  INTERVENTION:    Increase Vital AF 1.2 formula by 10 ml every 8 hours to goal rate of 60 ml/hr    Prostat liquid protein 60 ml TID  Total TF regimen to provide 2328 kcals, 198 gm protein, 1168 ml of free water  NUTRITION DIAGNOSIS:   Inadequate oral intake related to inability to eat as evidenced by NPO status; ongoing  GOAL:   Provide needs based on ASPEN/SCCM guidelines; progressing  MONITOR:   TF tolerance, Vent status, Labs, Weight trends, Skin, I & O's  ASSESSMENT:   66 yo Male who is several years out from an emergency colectomy with colostomy of the right transverse colon for an obstructing tumor; presented for reversal of his colostomy.  Pt s/p procedure 2/21: REVERSAL OF COLOSTOMY  Patient is currently intubated on ventilator support Temp (24hrs), Avg:99 F (37.2 C), Min:98.6 F (37 C), Max:99.8 F (37.7 C)  RD consulted for TF initiation & management.  Vital AF 1.2 formula currently infusing at 20 ml/hr via NGT. Surgery note reviewed; plan to taper TPN as TF increases to goal rate.  Labs reviewed.  TG 246 (H).  Medications reviewed. CBG's [id number].  Diet Order:  Diet NPO time specified .TPN (CLINIMIX-E) Adult  Skin:  Wound (see comment) (Stage I to lip)  Last BM:  3/12  Height:   Ht Readings from Last 1 Encounters:  04/22/16 5' 10.5" (1.791 m)    Weight:   Wt Readings from Last 1 Encounters:  05/02/16 (!) 403 lb (182.8 kg)  Admit weight 04/12/16 377 lbs (171 kg)  Ideal Body Weight:  75 kg  BMI:  Body mass index is 57.01 kg/m.  Estimated Nutritional Needs:   Kcal:  9292-4462  Protein:  >/= 187 gm  Fluid:  per MD  EDUCATION NEEDS:   No education needs identified at this time  [name], RD, LDN Pager #: [phone] After-Hours Pager #: [phone]

## 2016-05-02 NOTE — Consult Note (Signed)
[place] Nurse ostomy consultnote Stoma type/location: Ileostomy stoma red and viable, slightly above skin level, 2 inches round Peristomal assessment: pouch changed by bedside nursing staff last evening 05/01/16 Output:liquid green stool, +flatus in pouch Ostomy pouching: 1pc.  Education provided: Pt is intubated and sedated and no family members are present. [place] team members will plan to begin teaching sessions when stable and out of ICU. Supplies at the the bedside for staff nurse use. Enrolled patient in [place] program: No  WOC Nurse will follow along with you for continued support with ostomy teaching and care Gracielynn Birkel [facility] MSN, RN, [place], [place]

## 2016-05-02 NOTE — Progress Notes (Addendum)
PHARMACY - ADULT TOTAL PARENTERAL NUTRITION CONSULT NOTE   Pharmacy Consult for TPN Indication: Prolonged ileus  Patient Measurements: Height: 5' 10.5" (179.1 cm) Weight: (!) 403 lb (182.8 kg) IBW/kg (Calculated) : 74.15 TPN AdjBW (KG): 101.4 Body mass index is 57.01 kg/m.  Assessment: 80 YOM with history of an obstructing tumor s/p emergent colectomy with colostomy of right transverse colon in 2014. Patient presented on 04/12/16 for reversal of colostomy. Postoperatively, he had emesis on 04/14/16 and a distended abdomen on 04/15/16. He has consumed </= 10% of his meals for 7 days and Pharmacy consulted to manage TPN for prolonged ileus. Patient reports eating regularly and gained 3-4 lbs before admission.  GI:s/p ex-lap and repair of leak on 2/27. CT 3/8 shows possible mild SBO and small volume of abd free fluid.  LBM 3/11. Ileostomy 469mL, no NG O/P. Prealbumin improved 10.4 > 15.1. Albumin 2 >1.7, weight 171 kg > 183 kg (likely dt fluid). Simethicone, PPI IV, Vital AF 1.2 started 3/11. Endo:No hx DM, A1c 5.5% - CBGs controlled.  Insulin requirements in the past 24 hours:5units of SSI Lytes:all WNL, CorCal 10.3,  K 4.4, Mag 2.2, Phos 3.3(replaced 3/5-3/8) at last check.  Renal:CKD3 - Cr 1.76 ~stable, BUN 23- UOP 0.7 mL/kg/hr, + 1.2L I/O. 1/2NS at [facility]. Flomax, bethanechol. S/P Lasix x 4 doses, ended 3/9. CCM considering restart lasix.  Pulm:intubated 2/27 - FiO2 40%. CCM considering trach this week.  Cards:CM / CHF (EF 55-60%) / CAD post PCI / HLD / HTN / PAD / hx atrial tachycardia and torsades. BP & HR labile- metoprolol IV, ASA PR. CVP 15, +25L since adm.  Hepatobil:LFTs / tbili WNL (AST up slightly at 50). TG elevated 183 > 246.  Neuro:High dose Fentanyl 459mcg/hr, weaning Propofol. Agitation improved with Propofol vs Precedex.  RW:ERXVQ/MGQQPY for sepsis+ peritonitis, end today. Now afebrile, WBC WNL. Cultures NGTD, new blood cx 3/9. Heme/Onc: Hx colon cancer - hgb  down to 7.5, plts normal Best Practices:heparin SQ, CHG/mupirocin TPN Access: PICC placed 04/18/16 TPN start date:04/18/16  Nutritional Goals: per RD recs on 3/6 1950-9326 kCal and >/= 187 gm protein per day Clinimix 5/15 goal rate 125 mL/hr to provide 100% kcal and 80% protein needs daily  Current Nutrition:  Clinimix 5/15 with electrolyte additives providing 1278kcal, 90g protein Propofol rate reduced to 25 mcg/min/min (26 mL/hr) provides 686 fat kcal per day  Vital AF 1.2 at 64mL/hr + Prostat 61mL TID, providing 888 kcal and 108g protein   Plan: Reduce Clinimix 5/15 with electrolytes added to 40 mL/hr.  Hold lipids while on Propofol. Clinimix + Propofol at 25 mL/hr + Vital AF 1.2 at 10 ml/hr + Prostat will provide 2256 kCal and 156 gm of protein per day, meeting 100% of kCal and 83% of protein needs. Daily multivitamin in TPN Trace elements every other day, next dose today 3/12 Cont SSI Q6H Watch renal function and volume status F/U tolerance of TF and ability to wean TPN off  Argie Ramming, PharmD Pharmacy Resident  Pager [phone] 05/02/16 11:13 AM

## 2016-05-02 NOTE — Progress Notes (Signed)
[place] Attending Note  Presenting HPI:  66 y.o. 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 Presented on 2/20 for reversal of colostomy , developed recurrent abdominal pain on 2/26 and taken back to the OR on 2/27 finding free air with peritonitis and anastomotic leakage. Patient returned from the operating room on ventilator and with ongoing shock.  IMAGING/STUDIES: TTE 2/24: LV normal in size with moderate LVH. EF 55-60% with no regional wall motion abnormalities.    MICROBIOLOGY: Blood Cultures x2 2/27 >>>ng Tracheal Aspirate Culture 2/28 >>> nml flora MRSA PCR 2/28:  Negative  Blood Cultures x2 3/2 >>> Urine Culture 3/2 >>>ng  ANTIBIOTICS: Cefotan 2/20 (periop prophylaxis) Zosyn 2/27 >>>D/C after a total of 14 days Eraxis 2/28 >>>D/C after a total of 14 days  SIGNIFICANT EVENTS: 02/20 - Admit for colostomy reversal 02/23 - Postop ileus & tachycardia 02/24 - Cardiology Consulted 02/27 - OR for Exploratory Laparotomy finding anastomotic leak & peritonitis  3/2   transitioned to Precedex infusion and  fentanyl drip, febrile  Subjective:   No events overnight, remains unresponsive but more agitated with wean  Vent Mode: CPAP;PSV FiO2 (%):  [40 %] 40 % Set Rate:  [14 bmp] 14 bmp Vt Set:  [580 mL] 580 mL PEEP:  [5 cmH20] 5 cmH20 Pressure Support:  [5 cmH20] 5 cmH20 Plateau Pressure:  [22 cmH20-30 cmH20] 28 cmH20  Temp:  [98.6 F (37 C)-99.8 F (37.7 C)] 98.9 F (37.2 C) (03/12 0727) Pulse Rate:  [93-147] 99 (03/12 0800) Resp:  [8-24] 13 (03/12 0800) BP: (102-187)/(52-111) 104/58 (03/12 0800) SpO2:  [93 %-98 %] 93 % (03/12 0910) FiO2 (%):  [40 %] 40 % (03/12 0857) Weight:  [182.8 kg (403 lb)] 182.8 kg (403 lb) (03/12 0500)  General: Non-purposeful, agitated, weaning Neuro: Withdraws to pain but not following commands HEENT: /AT, PERRL, EOM-spontaneous and MMM PULM: Coarse BS  diffusely CV: IRIR (PVC's on monitor), Nl S1/S2, -M/R/G. GI: Soft, decreased BS, wound ok, distended Extremities: 1+ edema  LINES/TUBES: OETT 7.5 2/27 >>> R IJ CVL 2/27 >>> LUE DL PICC 2/26 >>> Foley >>> L NGT 2/27 >>> L RADIAL ART LINE 2/27 >>> discontinued PIV  CBC Latest Ref Rng & Units 05/02/2016 05/01/2016 04/30/2016  WBC 4.0 - 10.5 K/uL 7.6 10.2 9.7  Hemoglobin 13.0 - 17.0 g/dL 7.5(L) 8.3(L) 8.4(L)  Hematocrit 39.0 - 52.0 % 25.6(L) 28.7(L) 28.9(L)  Platelets 150 - 400 K/uL 307 331 364   BMP Latest Ref Rng & Units 05/02/2016 05/01/2016 04/30/2016  Glucose 65 - 99 mg/dL 147(H) 157(H) 139(H)  BUN 6 - 20 mg/dL 23(H) 24(H) 24(H)  Creatinine 0.61 - 1.24 mg/dL 1.76(H) 1.92(H) 2.02(H)  Sodium 135 - 145 mmol/L 141 139 139  Potassium 3.5 - 5.1 mmol/L 4.4 4.0 3.6  Chloride 101 - 111 mmol/L 104 102 100(L)  CO2 22 - 32 mmol/L _0 Calcium 8.9 - 10.3 mg/dL 8.5(L) 8.7(L) 8.5(L)   Hepatic Function Latest Ref Rng & Units 05/02/2016 05/01/2016 04/28/2016  Total Protein 6.5 - 8.1 g/dL 5.7(L) 6.5 5.4(L)  Albumin 3.5 - 5.0 g/dL 1.7(L) 1.8(L) 1.5(L)  AST 15 - 41 U/L 50(H) 35 24  ALT 17 - 63 U/L 49 38 26  Alk Phosphatase 38 - 126 U/L 103 103 81  Total Bilirubin 0.3 - 1.2 mg/dL 0.5 0.7 0.5  Bilirubin, Direct <=0.2 mg/dL - - -      ASSESSMENT/PLAN:  66 y.o. male, initially admitted  for reversal of ostomy, ended up with peritonitis from bowel anastomotic leak. Off pressors & improving renal function. TF stopped on 3/5  because of concern for abd distention.  TF started at trickle.  1. S/P Septic shock (resolved) secondary to peritonitis.  Continuing broad-spectrum coverage with Zosyn and Eraxis. Cultures remain (-). Abdomen is noted to be distended since 3/6.  TF held on 3/5.  For CT this am, TPN needed, CT done,, stop dates for abx in place.  Now hypertensive, will change PRN lopressor to labetalol IV until able to take PO. 2. Acute on chronic hypoxemic and hypercarbic respiratory failure:  effusions likely.  Begin PS trials but will need trach, I do not see successful extubation here even with wean given prolonged illness and mental status 3. Hold all sedation for wake up assessment 4. Add precedex for agitation (QT interval was prolonged 0.4 this AM) 5. Acute on chronic stage III renal failure, Cr improving, will hold lasix given QT and starting precedex 6. Hyperglycemia: with glu in am bmet, add ssi cbg 7. History of coronary artery disease: ASA daily  The patient is critically ill with multiple organ systems failure and requires high complexity decision making for assessment and support, frequent evaluation and titration of therapies, application of advanced monitoring technologies and extensive interpretation of multiple databases.   Critical Care Time devoted to patient care services described in this note is  35  Minutes. This time reflects time of care of this signee Dr Jennet Maduro. This critical care time does not reflect procedure time, or teaching time or supervisory time of PA/NP/Med student/Med Resident etc but could involve care discussion time.  Rush Farmer, M.D. [facility]. Pager: [phone]. After hours pager: [phone].

## 2016-05-02 NOTE — Progress Notes (Signed)
[facility] Progress Note Subjective: Patient has not been able to wean over the weekend.  Not very responsive today.  Objective: Vital signs in last 24 hours: Temp:  [98.6 F (37 C)-98.9 F (37.2 C)] 98.9 F (37.2 C) (03/12 0727) Pulse Rate:  [93-147] 99 (03/12 0950) Resp:  [8-24] 17 (03/12 0950) BP: (102-187)/(52-126) 144/81 (03/12 0950) SpO2:  [93 %-98 %] 93 % (03/12 0950) FiO2 (%):  [40 %] 40 % (03/12 0857) Weight:  [182.8 kg (403 lb)] 182.8 kg (403 lb) (03/12 0500) Last BM Date: 05/02/16  Intake/Output from previous day: 03/11 0701 - 03/12 0700 In: 4673.5 [I.V.:4050.8; NG/GT:472.7; IV Piggyback:150] Out: 4742 [Urine:3075; Stool:400] Intake/Output this shift: Total I/O In: 296.5 [I.V.:276.5; NG/GT:20] Out: 725 [Urine:725]  Lungs: Clear.  Oxygen saturations are good.  Abd: distended, taut, hypoactive bowel sounds.  About 250 cc of ileostoy output from loop ileostomy.  Extremities: No changes  Neuro: Sedated and seems delirious  Lab Results: CBC   Recent Labs  05/01/16 0344 05/02/16 0500  WBC 10.2 7.6  HGB 8.3* 7.5*  HCT 28.7* 25.6*  PLT 331 307   BMET  Recent Labs  05/01/16 0344 05/02/16 0500  NA 139 141  K 4.0 4.4  CL 102 104  CO2 29 31  GLUCOSE 157* 147*  BUN 24* 23*  CREATININE 1.92* 1.76*  CALCIUM 8.7* 8.5*   PT/INR No results for input(s): LABPROT, INR in the last 72 hours. ABG No results for input(s): PHART, HCO3 in the last 72 hours.  Invalid input(s): PCO2, PO2  Studies/Results: Dg Chest Port 1 View  Result Date: 05/02/2016 CLINICAL DATA:  Respiratory failure, intubated patient. History of COPD, coronary artery disease, CHF, colonic malignancy. Former smoker. EXAM: PORTABLE CHEST 1 VIEW COMPARISON:  Chest x-ray of May 01, 2016 FINDINGS: The lungs are well-expanded. There are pleural effusions at both bases greatest on the left. There is airspace opacity in both lower lobes. The cardiac silhouette is enlarged. The pulmonary vascularity is  mildly engorged. The endotracheal tube tip lies 5.5 cm above the carina. The esophagogastric tube tip and proximal port project below the expected location of the GE junction. The left PICC line tip projects over the junction of the middle and distal thirds of the SVC. IMPRESSION: Persistent bibasilar atelectasis or pneumonia with small bilateral pleural effusions. Stable cardiomegaly with central pulmonary vascular congestion consistent with CHF. The support tubes are in reasonable position. Electronically Signed   By: David  Martinique M.D.   On: 05/02/2016 07:48   Dg Chest Port 1 View  Result Date: 05/01/2016 CLINICAL DATA:  66 year old male status post multiple colon surgeries - most recently exploratory laparotomy with repair of anastomotic leak following a colostomy reversal, and placement of loop ileostomy on 04/19/2016. Moderate to large bilateral pleural effusions on recent CT Abdomen and Pelvis. EXAM: PORTABLE CHEST 1 VIEW COMPARISON:  04/30/2016 and earlier. FINDINGS: Portable AP semi upright view at 0651 hours. Stable endotracheal tube tip below the clavicles. Enteric tube courses to the abdomen, tip not included. Stable cardiomegaly and mediastinal contours. Continued veiling opacity compatible with the pleural effusions seen on the recent CT. No superimposed pneumothorax. No definite consolidation. Pulmonary vascular congestion without overt edema. IMPRESSION: 1.  Stable lines and tubes. 2. Bilateral pleural effusions with atelectasis. Pulmonary vascular congestion without overt edema. Electronically Signed   By: Genevie Ann M.D.   On: 05/01/2016 08:24   Dg Abd Portable 1v  Result Date: 04/30/2016 CLINICAL DATA:  OG tube placement. EXAM: PORTABLE ABDOMEN -  1 VIEW COMPARISON:  None. FINDINGS: An OG tube is identified with tip overlying the distal stomach. A percutaneous catheter overlying the right abdomen is faintly visualized. IMPRESSION: OG tube with tip overlying the distal stomach. Electronically  Signed   By: Margarette Canada M.D.   On: 04/30/2016 19:01    Anti-infectives: Anti-infectives    Start     Dose/Rate Route Frequency Ordered Stop   04/21/16 1000  anidulafungin (ERAXIS) 100 mg in sodium chloride 0.9 % 100 mL IVPB     100 mg over 90 Minutes Intravenous Every 24 hours 04/20/16 1008 05/02/16 1115   04/20/16 1045  anidulafungin (ERAXIS) 200 mg in sodium chloride 0.9 % 200 mL IVPB     200 mg over 180 Minutes Intravenous  Once 04/20/16 1008 04/20/16 1422   04/19/16 2030  piperacillin-tazobactam (ZOSYN) IVPB 3.375 g     3.375 g 12.5 mL/hr over 240 Minutes Intravenous Every 8 hours 04/19/16 1958 05/02/16 2359   04/19/16 1230  cefoTEtan in Dextrose 5% (CEFOTAN) 2-2.08 GM-% IVPB    Comments:  Ivar Drape   : cabinet override      04/19/16 1230 04/19/16 1245   04/12/16 2000  cefoTEtan (CEFOTAN) 2 g in dextrose 5 % 50 mL IVPB     2 g 100 mL/hr over 30 Minutes Intravenous Every 12 hours 04/12/16 1120 04/12/16 2125   04/12/16 0700  cefoTEtan (CEFOTAN) 2 g in dextrose 5 % 50 mL IVPB     2 g 100 mL/hr over 30 Minutes Intravenous To ShortStay Surgical 04/11/16 1404 04/12/16 0753   04/12/16 0548  cefoTEtan in Dextrose 5% (CEFOTAN) 2-2.08 GM-% IVPB    Comments:  Precious Haws   : cabinet override      04/12/16 0548 04/12/16 1759   04/12/16 0544  neomycin (MYCIFRADIN) tablet 1,000 mg  Status:  Discontinued     1,000 mg Oral 3 times per day 04/12/16 0544 04/12/16 0547   04/12/16 0544  metroNIDAZOLE (FLAGYL) tablet 1,000 mg  Status:  Discontinued     1,000 mg Oral 3 times per day 04/12/16 0544 04/12/16 0547      Assessment/Plan: s/p Procedure(s): EXPLORATORY LAPAROTOMY, REPAIR OF ANASTAMOTIC LEAK COLOSTOMY I agree that the patient needs a tracheostomy soon.  His abdominal wound has dehisced about 75% without evisceration.  If that worsens would need to take him back for closure or repair.  Continue enteral nutrition as long as ileostomy is functioning.  LOS: 20 days    Kathryne Eriksson.  Dahlia Bailiff, MD, FACS [phone] [facility] 05/02/2016

## 2016-05-03 ENCOUNTER — Inpatient Hospital Stay (HOSPITAL_COMMUNITY): Payer: Medicare Other

## 2016-05-03 DIAGNOSIS — J9601 Acute respiratory failure with hypoxia: Secondary | ICD-10-CM

## 2016-05-03 LAB — BASIC METABOLIC PANEL
ANION GAP: 6 (ref 5–15)
Anion gap: 6 (ref 5–15)
BUN: 24 mg/dL — ABNORMAL HIGH (ref 6–20)
BUN: 25 mg/dL — AB (ref 6–20)
CALCIUM: 8.7 mg/dL — AB (ref 8.9–10.3)
CO2: 27 mmol/L (ref 22–32)
CO2: 30 mmol/L (ref 22–32)
Calcium: 7.9 mg/dL — ABNORMAL LOW (ref 8.9–10.3)
Chloride: 107 mmol/L (ref 101–111)
Chloride: 109 mmol/L (ref 101–111)
Creatinine, Ser: 1.36 mg/dL — ABNORMAL HIGH (ref 0.61–1.24)
Creatinine, Ser: 1.41 mg/dL — ABNORMAL HIGH (ref 0.61–1.24)
GFR calc Af Amer: 60 mL/min — ABNORMAL LOW (ref >60)
GFR, EST NON AFRICAN AMERICAN: 54 mL/min — AB (ref >60)
GFR, EST NON AFRICAN AMERICAN: 52 mL/min — AB (ref >60)
GLUCOSE: 488 mg/dL — AB (ref 65–99)
GLUCOSE: 142 mg/dL — AB (ref 65–99)
POTASSIUM: 5.1 mmol/L (ref 3.5–5.1)
POTASSIUM: 4.7 mmol/L (ref 3.5–5.1)
SODIUM: 140 mmol/L (ref 135–145)
Sodium: 145 mmol/L (ref 135–145)

## 2016-05-03 LAB — CBC
HCT: 22.2 % — ABNORMAL LOW (ref 39.0–52.0)
HCT: 23.1 % — ABNORMAL LOW (ref 39.0–52.0)
Hemoglobin: 6.2 g/dL — CL (ref 13.0–17.0)
Hemoglobin: 6.5 g/dL — CL (ref 13.0–17.0)
MCH: 27.2 pg (ref 26.0–34.0)
MCH: 26.6 pg (ref 26.0–34.0)
MCHC: 27.9 g/dL — ABNORMAL LOW (ref 30.0–36.0)
MCHC: 28.1 g/dL — ABNORMAL LOW (ref 30.0–36.0)
MCV: 97.4 fL (ref 78.0–100.0)
MCV: 94.7 fL (ref 78.0–100.0)
PLATELETS: 211 10*3/uL (ref 150–400)
PLATELETS: 242 10*3/uL (ref 150–400)
RBC: 2.28 MIL/uL — ABNORMAL LOW (ref 4.22–5.81)
RBC: 2.44 MIL/uL — AB (ref 4.22–5.81)
RDW: 16.3 % — AB (ref 11.5–15.5)
RDW: 15.9 % — ABNORMAL HIGH (ref 11.5–15.5)
WBC: 6.1 10*3/uL (ref 4.0–10.5)
WBC: 6.8 10*3/uL (ref 4.0–10.5)

## 2016-05-03 LAB — BLOOD GAS, ARTERIAL
ACID-BASE EXCESS: 5 mmol/L — AB (ref 0.0–2.0)
BICARBONATE: 30.8 mmol/L — AB (ref 20.0–28.0)
DRAWN BY: 10006
FIO2: 40
MECHVT: 580 mL
O2 Saturation: 96.2 %
PEEP/CPAP: 5 cmH2O
Patient temperature: 98.6
RATE: 14 resp/min
pCO2 arterial: 61.5 mmHg — ABNORMAL HIGH (ref 32.0–48.0)
pH, Arterial: 7.32 — ABNORMAL LOW (ref 7.350–7.450)
pO2, Arterial: 92 mmHg (ref 83.0–108.0)

## 2016-05-03 LAB — GLUCOSE, CAPILLARY
GLUCOSE-CAPILLARY: 121 mg/dL — AB (ref 65–99)
GLUCOSE-CAPILLARY: 121 mg/dL — AB (ref 65–99)
GLUCOSE-CAPILLARY: 125 mg/dL — AB (ref 65–99)
GLUCOSE-CAPILLARY: 127 mg/dL — AB (ref 65–99)
GLUCOSE-CAPILLARY: 133 mg/dL — AB (ref 65–99)
Glucose-Capillary: 129 mg/dL — ABNORMAL HIGH (ref 65–99)
Glucose-Capillary: 138 mg/dL — ABNORMAL HIGH (ref 65–99)
Glucose-Capillary: 113 mg/dL — ABNORMAL HIGH (ref 65–99)

## 2016-05-03 LAB — APTT: aPTT: 31 seconds (ref 24–36)

## 2016-05-03 LAB — OCCULT BLOOD X 1 CARD TO LAB, STOOL: FECAL OCCULT BLD: POSITIVE — AB

## 2016-05-03 LAB — MAGNESIUM
MAGNESIUM: 2.1 mg/dL (ref 1.7–2.4)
Magnesium: 2.3 mg/dL (ref 1.7–2.4)

## 2016-05-03 LAB — PREPARE RBC (CROSSMATCH)

## 2016-05-03 LAB — PROTIME-INR
INR: 1.25
Prothrombin Time: 15.7 seconds — ABNORMAL HIGH (ref 11.4–15.2)

## 2016-05-03 LAB — PHOSPHORUS
PHOSPHORUS: 4.5 mg/dL (ref 2.5–4.6)
Phosphorus: 3.4 mg/dL (ref 2.5–4.6)

## 2016-05-03 MED ORDER — FENTANYL CITRATE (PF) 100 MCG/2ML IJ SOLN
INTRAMUSCULAR | Status: AC
  Filled 2016-05-03: qty 4

## 2016-05-03 MED ORDER — M.V.I. ADULT IV INJ
INTRAVENOUS | Status: AC
  Administered 2016-05-03: 18:00:00 via INTRAVENOUS
  Filled 2016-05-03: qty 720

## 2016-05-03 MED ORDER — VECURONIUM BROMIDE 10 MG IV SOLR
10.0000 mg | Freq: Once | INTRAVENOUS | Status: AC
  Administered 2016-05-03: 10 mg via INTRAVENOUS

## 2016-05-03 MED ORDER — ROCURONIUM BROMIDE 50 MG/5ML IV SOLN
100.0000 mg | Freq: Once | INTRAVENOUS | Status: DC
  Filled 2016-05-03: qty 10

## 2016-05-03 MED ORDER — MIDAZOLAM HCL 2 MG/2ML IJ SOLN
4.0000 mg | Freq: Once | INTRAMUSCULAR | Status: AC
  Administered 2016-05-03: 4 mg via INTRAVENOUS
  Filled 2016-05-03: qty 4

## 2016-05-03 MED ORDER — FENTANYL CITRATE (PF) 100 MCG/2ML IJ SOLN
200.0000 ug | Freq: Once | INTRAMUSCULAR | Status: AC
  Administered 2016-05-03: 100 ug via INTRAVENOUS
  Filled 2016-05-03: qty 4

## 2016-05-03 MED ORDER — PROPOFOL 500 MG/50ML IV EMUL
5.0000 ug/kg/min | Freq: Once | INTRAVENOUS | Status: AC
  Administered 2016-05-03: 40 ug/kg/min via INTRAVENOUS

## 2016-05-03 MED ORDER — SODIUM PHOSPHATES 15 MMOLE/5ML IV SOLN
INTRAVENOUS | Status: DC
  Filled 2016-05-03: qty 720

## 2016-05-03 MED ORDER — ETOMIDATE 2 MG/ML IV SOLN
40.0000 mg | Freq: Once | INTRAVENOUS | Status: AC
  Administered 2016-05-03: 40 mg via INTRAVENOUS

## 2016-05-03 MED ORDER — SODIUM CHLORIDE 0.9 % IV SOLN: Freq: Once | INTRAVENOUS | Status: DC

## 2016-05-03 NOTE — Progress Notes (Signed)
CRITICAL VALUE ALERT  Critical value received:  Hemoglobin 6.2; Glucose 644  Date of notification: 05/03/16   Time of notification:  4:42 AM   Critical value read back:Yes.    Nurse who received alert:  Clyda Hurdle RN   MD notified (1st page):  4:42 AM  Time of first page:  4:43 AM  MD notified (2nd page):  Time of second page:  Responding MD:  Simonne Maffucci   Time MD responded:  4:43 AM   MD verbal order; repeat labs; suspect hemodilution

## 2016-05-03 NOTE — Procedures (Signed)
Bronchoscopy Procedure Note Larry [name] [id number] 04-Sep-1950  Procedure: Bronchoscopy Indications: Percutaneous Tracheostomy Placement  Procedure Details Consent: Risks of procedure as well as the alternatives and risks of each were explained to the (patient/caregiver).  Consent for procedure obtained.   Time Out: Verified patient identification, verified procedure, site/side was marked, verified correct patient position, special equipment/implants available, medications/allergies/relevent history reviewed, required imaging and test results available.  Performed  In preparation for procedure, patient was given 100% FiO2 and bronchoscope lubricated. Sedation: Benzodiazepines, Etomidate and Propofol, rocuronium  Airway entered and the upper airway examined, copious secretions Procedures performed: tracheostomy placement  Bronchoscope removed.    Evaluation Hemodynamic Status: BP stable throughout; O2 sats: stable throughout Patient's Current Condition: stable Specimens:  None Complications: No apparent complications Patient tolerated the procedure well.  Procedure performed under direct supervision of Dr. Nelda Marseille and with video guidance for bronchoscopy.   Larry Gens, NP-C [facility] Pgr: [phone] or if no answer [phone] 05/03/2016, 2:42 PM  I was present and supervised the procedure  Rush Farmer, M.D. [facility] Pulmonary/Critical Care Medicine. Pager: [phone]. After hours pager: [phone].

## 2016-05-03 NOTE — Progress Notes (Signed)
[facility] Progress Note Patient Name: Larry [name] DOB: 1950-05-31 MRN: [id number]   Date of Service  05/03/2016  HPI/Events of Note  Anemia on CBC, but also WBC down, PLT down; clinical status unchanged, labs spurius?  eICU Interventions  Repeat CBC     Intervention Category Intermediate Interventions: Diagnostic test evaluation  Simonne Maffucci 05/03/2016, 4:23 AM

## 2016-05-03 NOTE — Progress Notes (Signed)
GS Progress Note Subjective: Patient struggling a bit with the wake up assessment this AM.    Objective: Vital signs in last 24 hours: Temp:  [97.7 F (36.5 C)-99.8 F (37.7 C)] 98.9 F (37.2 C) (03/13 0400) Pulse Rate:  [76-126] 84 (03/13 0700) Resp:  [15-26] 16 (03/13 0700) BP: (104-181)/(59-126) 138/73 (03/13 0700) SpO2:  [90 %-100 %] 98 % (03/13 0700) FiO2 (%):  [40 %] 40 % (03/13 0400) Weight:  [184.6 kg (407 lb)] 184.6 kg (407 lb) (03/13 0135) Last BM Date: 05/02/16  Intake/Output from previous day: 03/12 0701 - 03/13 0700 In: 4515 [I.V.:4000.5; NG/GT:414.5; IV Piggyback:100] Out: 3680 [Urine:3475; Drains:5; Stool:200] Intake/Output this shift: No intake/output data recorded.  Lungs: Coarse breath sounds bilaterally R> L  Abd: Distended, but softer than yesterday.  Ileostomy is functioning and output was 600 cc over the last two days.  Some gas coming out.  Thicker effluent  Extremities: No changes  Neuro: Sedated and delirious  Lab Results: CBC   Recent Labs  05/03/16 0330 05/03/16 0430  WBC 6.1 6.8  HGB 6.2* 6.5*  HCT 22.2* 23.1*  PLT 211 242   BMET  Recent Labs  05/02/16 0500 05/03/16 0430  NA 141 140  K 4.4 5.1  CL 104 107  CO2 31 27  GLUCOSE 147* 488*  BUN 23* 24*  CREATININE 1.76* 1.36*  CALCIUM 8.5* 7.9*   PT/INR No results for input(s): LABPROT, INR in the last 72 hours. ABG  Recent Labs  05/03/16 0400  PHART 7.320*  HCO3 30.8*    Studies/Results: Dg Chest Port 1 View  Result Date: 05/03/2016 CLINICAL DATA:  Intubated patient, respiratory failure. EXAM: PORTABLE CHEST 1 VIEW COMPARISON:  Portable chest x-ray of May 02, 2016 FINDINGS: The lungs are well-expanded. The interstitial markings remain increased and the pulmonary vascularity engorged greatest on the right. The cardiac silhouette is mildly enlarged. The pulmonary vascularity is engorged. There is calcification in the wall of the aortic arch. There remains left basilar  atelectasis or pneumonia with small left pleural effusion. The endotracheal tube tip measures 6.4 cm above the carina. The esophagogastric tube tip projects below the inferior margin of the image. The PICC line tip projects over the midportion of the SVC. IMPRESSION: Fairly stable appearance of the chest consistent with CHF and pulmonary interstitial edema and small bilateral pleural effusions. Persistent left basilar atelectasis or pneumonia. The support tubes are in reasonable position. Thoracic aortic atherosclerosis. Electronically Signed   By: David  Martinique M.D.   On: 05/03/2016 07:23   Dg Chest Port 1 View  Result Date: 05/02/2016 CLINICAL DATA:  Respiratory failure, intubated patient. History of COPD, coronary artery disease, CHF, colonic malignancy. Former smoker. EXAM: PORTABLE CHEST 1 VIEW COMPARISON:  Chest x-ray of May 01, 2016 FINDINGS: The lungs are well-expanded. There are pleural effusions at both bases greatest on the left. There is airspace opacity in both lower lobes. The cardiac silhouette is enlarged. The pulmonary vascularity is mildly engorged. The endotracheal tube tip lies 5.5 cm above the carina. The esophagogastric tube tip and proximal port project below the expected location of the GE junction. The left PICC line tip projects over the junction of the middle and distal thirds of the SVC. IMPRESSION: Persistent bibasilar atelectasis or pneumonia with small bilateral pleural effusions. Stable cardiomegaly with central pulmonary vascular congestion consistent with CHF. The support tubes are in reasonable position. Electronically Signed   By: David  Martinique M.D.   On: 05/02/2016 07:48  Anti-infectives: Anti-infectives    Start     Dose/Rate Route Frequency Ordered Stop   04/21/16 1000  anidulafungin (ERAXIS) 100 mg in sodium chloride 0.9 % 100 mL IVPB     100 mg over 90 Minutes Intravenous Every 24 hours 04/20/16 1008 05/02/16 1115   04/20/16 1045  anidulafungin (ERAXIS) 200  mg in sodium chloride 0.9 % 200 mL IVPB     200 mg over 180 Minutes Intravenous  Once 04/20/16 1008 04/20/16 1422   04/19/16 2030  piperacillin-tazobactam (ZOSYN) IVPB 3.375 g     3.375 g 12.5 mL/hr over 240 Minutes Intravenous Every 8 hours 04/19/16 1958 05/02/16 2359   04/19/16 1230  cefoTEtan in Dextrose 5% (CEFOTAN) 2-2.08 GM-% IVPB    Comments:  Ivar Drape   : cabinet override      04/19/16 1230 04/19/16 1245   04/12/16 2000  cefoTEtan (CEFOTAN) 2 g in dextrose 5 % 50 mL IVPB     2 g 100 mL/hr over 30 Minutes Intravenous Every 12 hours 04/12/16 1120 04/12/16 2125   04/12/16 0700  cefoTEtan (CEFOTAN) 2 g in dextrose 5 % 50 mL IVPB     2 g 100 mL/hr over 30 Minutes Intravenous To ShortStay Surgical 04/11/16 1404 04/12/16 0753   04/12/16 0548  cefoTEtan in Dextrose 5% (CEFOTAN) 2-2.08 GM-% IVPB    Comments:  Precious Haws   : cabinet override      04/12/16 0548 04/12/16 1759   04/12/16 0544  neomycin (MYCIFRADIN) tablet 1,000 mg  Status:  Discontinued     1,000 mg Oral 3 times per day 04/12/16 0544 04/12/16 0547   04/12/16 0544  metroNIDAZOLE (FLAGYL) tablet 1,000 mg  Status:  Discontinued     1,000 mg Oral 3 times per day 04/12/16 0544 04/12/16 0547      Assessment/Plan: s/p Procedure(s): EXPLORATORY LAPAROTOMY, REPAIR OF ANASTAMOTIC LEAK COLOSTOMY Anemia this AM requiring transfusion  Patent needs tracheostomy.  I can perform it in the OR tomorrow or it can be done bedside by the critical care team.    Will need to discuss with the patient's family. Increase tube feedings to goal.  LOS: 21 days    Kathryne Eriksson. Dahlia Bailiff, MD, FACS [phone] [facility] 05/03/2016

## 2016-05-03 NOTE — Progress Notes (Signed)
[place] [facility] Attending Note  Presenting HPI:  66 y.o. 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 Presented on 2/20 for reversal of colostomy , developed recurrent abdominal pain on 2/26 and taken back to the OR on 2/27 finding free air with peritonitis and anastomotic leakage. Patient returned from the operating room on ventilator and with ongoing shock.  IMAGING/STUDIES: TTE 2/24: LV normal in size with moderate LVH. EF 55-60% with no regional wall motion abnormalities.   MICROBIOLOGY: Blood Cultures x2 2/27 >>>ng Tracheal Aspirate Culture 2/28 >>> nml flora MRSA PCR 2/28:  Negative  Blood Cultures x2 3/2 >>> Urine Culture 3/2 >>>ng  ANTIBIOTICS: Cefotan 2/20 (periop prophylaxis) Zosyn 2/27 >>>D/C after a total of 14 days Eraxis 2/28 >>>D/C after a total of 14 days  SIGNIFICANT EVENTS: 02/20 - Admit for colostomy reversal 02/23 - Postop ileus & tachycardia 02/24 - Cardiology Consulted 02/27 - OR for Exploratory Laparotomy finding anastomotic leak & peritonitis  3/2   transitioned to Precedex infusion and  fentanyl drip, febrile  Subjective:   No events overnight, remains unresponsive but more agitated with wean  Vent Mode: PRVC FiO2 (%):  [40 %-50 %] 50 % Set Rate:  [14 bmp] 14 bmp Vt Set:  [580 mL] 580 mL PEEP:  [5 cmH20] 5 cmH20 Plateau Pressure:  [20 cmH20-27 cmH20] 20 cmH20  Temp:  [97.7 F (36.5 C)-99.9 F (37.7 C)] 99.9 F (37.7 C) (03/13 0818) Pulse Rate:  [76-126] 90 (03/13 0930) Resp:  [15-30] 19 (03/13 0930) BP: (104-219)/(59-132) 128/67 (03/13 0930) SpO2:  [85 %-100 %] 96 % (03/13 0930) FiO2 (%):  [40 %-50 %] 50 % (03/13 0818) Weight:  [184.6 kg (407 lb)] 184.6 kg (407 lb) (03/13 0135)  General: Non-purposeful, agitated, weaning Neuro: Withdraws to pain but not following commands HEENT: [place]/AT, PERRL, EOM-spontaneous and MMM PULM: Coarse BS diffusely CV: IRIR (PVC's on  monitor), Nl S1/S2, -M/R/G. GI: Soft, decreased BS, wound ok, distended Extremities: 1+ edema  LINES/TUBES: OETT 7.5 2/27 >>> R IJ CVL 2/27 >>> LUE DL PICC 2/26 >>> Foley >>> L NGT 2/27 >>> L RADIAL ART LINE 2/27 >>> discontinued PIV  CBC Latest Ref Rng & Units 05/03/2016 05/03/2016 05/02/2016  WBC 4.0 - 10.5 K/uL 6.8 6.1 7.6  Hemoglobin 13.0 - 17.0 g/dL 6.5(LL) 6.2(LL) 7.5(L)  Hematocrit 39.0 - 52.0 % 23.1(L) 22.2(L) 25.6(L)  Platelets 150 - 400 K/uL 242 211 307   BMP Latest Ref Rng & Units 05/03/2016 05/03/2016 05/02/2016  Glucose 65 - 99 mg/dL 142(H) 488(H) 147(H)  BUN 6 - 20 mg/dL 25(H) 24(H) 23(H)  Creatinine 0.61 - 1.24 mg/dL 1.41(H) 1.36(H) 1.76(H)  Sodium 135 - 145 mmol/L 145 140 141  Potassium 3.5 - 5.1 mmol/L 4.7 5.1 4.4  Chloride 101 - 111 mmol/L 109 107 104  CO2 22 - 32 mmol/L _0 Calcium 8.9 - 10.3 mg/dL 8.7(L) 7.9(L) 8.5(L)   Hepatic Function Latest Ref Rng & Units 05/02/2016 05/01/2016 04/28/2016  Total Protein 6.5 - 8.1 g/dL 5.7(L) 6.5 5.4(L)  Albumin 3.5 - 5.0 g/dL 1.7(L) 1.8(L) 1.5(L)  AST 15 - 41 U/L 50(H) 35 24  ALT 17 - 63 U/L 49 38 26  Alk Phosphatase 38 - 126 U/L 103 103 81  Total Bilirubin 0.3 - 1.2 mg/dL 0.5 0.7 0.5  Bilirubin, Direct <=0.2 mg/dL - - -   ASSESSMENT/PLAN:  66 y.o. male, initially admitted for reversal of ostomy, ended up with peritonitis from  bowel anastomotic leak. Off pressors & improving renal function. TF stopped on 3/5  because of concern for abd distention.  TF started at trickle.  1. S/P Septic shock (resolved) secondary to peritonitis.  D/C abx, course complete 2. Acute on chronic hypoxemic and hypercarbic respiratory failure: Trach 3/13 at 2 PM then proceed with weaning 3. Hold all sedation for wake up assessment as able, now on precedex and fentanyl 4. Acute on chronic stage III renal failure, Cr improving, will hold lasix given QT and starting precedex 5. Hyperglycemia: with glu in am bmet, add ssi cbg 6. History of coronary  artery disease: ASA daily 7. Hg of 6.5, will transfuse 1 unit pRBC  The patient is critically ill with multiple organ systems failure and requires high complexity decision making for assessment and support, frequent evaluation and titration of therapies, application of advanced monitoring technologies and extensive interpretation of multiple databases.   Critical Care Time devoted to patient care services described in this note is  35  Minutes. This time reflects time of care of this signee Dr Jennet Maduro. This critical care time does not reflect procedure time, or teaching time or supervisory time of PA/NP/Med student/Med Resident etc but could involve care discussion time.  Rush Farmer, M.D. [facility] Pulmonary/Critical Care Medicine. Pager: [phone]. After hours pager: [phone].

## 2016-05-03 NOTE — Progress Notes (Addendum)
PHARMACY - ADULT TOTAL PARENTERAL NUTRITION CONSULT NOTE   Pharmacy Consult for TPN Indication: Prolonged ileus  Patient Measurements: Height: 5' 10.5" (179.1 cm) Weight: (!) 407 lb (184.6 kg) IBW/kg (Calculated) : 74.15 TPN AdjBW (KG): 101.4 Body mass index is 57.57 kg/m.  Assessment: 71 YOM with history of an obstructing tumor s/p emergent colectomy with colostomy of right transverse colon in 2014. Patient presented on 04/12/16 for reversal of colostomy. Postoperatively, he had emesis on 04/14/16 and a distended abdomen on 04/15/16. He has consumed </= 10% of his meals for 7 days and Pharmacy consulted to manage TPN for prolonged ileus. Patient reports eating regularly and gained 3-4 lbs before admission.  GI:s/p ex-lap and repair of leak on 2/27. CT 3/8 shows possible mild SBO and small volume of abd free fluid.  LBM 3/12. Ileostomy 211mL, no NG O/P. FOB +. Abd wound dehisced 75% per surgery. Prealbumin improved 10.4 > 15.1. Albumin 2 >1.7, weight 171 kg > 185 kg (likely dt fluid). Simethicone, PPI IV, Vital AF 1.2 started 3/11. Endo:No hx DM, A1c 5.5% - CBGs controlled  Insulin requirements in the past 24 hours:6units of SSI Lytes:all WNL, CorCal 9.7, K 4.7, Mag 2.3, Phos 3.4 Renal:CKD3 - Cr 1.36, BUN 24- UOP 0.8 mL/kg/hr, + 818mL I/O. 1/2NS at [facility]. Flomax, bethanechol. S/P Lasix x 4 doses, ended 3/9. CCM considering restart lasix.  Pulm:intubated 2/27 - FiO2 40%. CCM considering trach this week.  Cards:CM / CHF (EF 55-60%) / CAD post PCI / HLD / HTN / PAD / hx atrial tachycardia and torsades. BP & HR labile- metoprolol IV, ASA PR. CVP 19, +26L since adm.  Hepatobil:LFTs / tbili WNL (AST up slightly at 50). TG elevated 183 > 246.  Neuro:High dose Fentanyl 462mcg/hr. Transitioned Propofol to Precedex.  ID:s/p Zosyn/Eraxis for sepsis/peritonitis. Afebrile, WBC WNL. Cultures NGTD. Heme/Onc: Hx colon cancer - hgb down to 6.2 (1 unit PRBC 3/13), plts normal Best  Practices:heparin SQ, CHG/mupirocin TPN Access: PICC placed 04/18/16 TPN start date:04/18/16  Nutritional Goals: per RD recs on 3/12 5597-4163 kCal and >/= 187 gm protein per day  Current Nutrition:  Clinimix E5/15  Vital AF 1.2 at 66mL/hr + Prostat 29mL TID, providing 1464 kcal and 144 g protein  *For trach 3/13, will likely hold TF around procedure   Plan: Reduce Clinimix E5/15 to 30 mL/hr. Hold lipids while transitioning to TF. Continue clinimix today given plan for trach 3/13 and likely TF hold around procedure.  Clinimix + Vital AF 1.2 at 30 ml/hr + Prostat will provide 1976 kCal and 180 g of protein per day, meeting ~100% of kCal and protein needs. Daily multivitamin in TPN Trace elements every other day, next dose 3/14 Cont SSI Q6H Watch renal function and volume status F/U BMP/Mag in AM , tolerance of TF and ability to wean TPN off  Argie Ramming, PharmD Pharmacy Resident  Pager [phone] 05/03/16 7:30 AM

## 2016-05-03 NOTE — Procedures (Signed)
Bedside Tracheostomy Insertion Procedure Note   Patient Details:   Name: Larry [name] DOB: [birth year] MRN: [id number]  Procedure: Tracheostomy  Pre Procedure Assessment: ET Tube Size: 7.5 ET Tube secured at lip (cm): 24 Bite block in place: No Breath Sounds: Clear  Post Procedure Assessment: BP (!) 152/82   Pulse 83   Temp 99.5 F (37.5 C) (Oral)   Resp (!) 22   Ht 5' 10.5" (1.791 m)   Wt (!) 407 lb (184.6 kg)   SpO2 100%   BMI 57.57 kg/m  O2 sats: stable throughout Complications: No apparent complications Patient did tolerate procedure well Tracheostomy Brand:Shiley Tracheostomy Style:Cuffed Tracheostomy Size: 6.0 Tracheostomy Secured ZTA:[id number] Tracheostomy Placement Confirmation:Trach cuff visualized and in place    Phillis Knack [facility] 05/03/2016, 2:43 PM

## 2016-05-03 NOTE — Procedures (Signed)
Percutaneous Tracheostomy Placement  Consent from family.  Patient sedated, paralyzed and position.  Placed on 100% FiO2 and RR matched.  Area cleaned and draped.  Lidocaine/epi injected.  Skin incision done followed by blunt dissection.  Trachea palpated then punctured, catheter passed and visualized bronchoscopically.  Wire placed and visualized.  Catheter removed.  Airway then opened and dilated.  Size 6 cuffed shiley trach placed and visualized bronchoscopically well above carina.  Good volume returns.  Patient tolerated the procedure well without complications.  Minimal blood loss.  CXR ordered and pending.  Rush Farmer, M.D. [facility] Pulmonary/Critical Care Medicine. Pager: [phone]. After hours pager: [phone].

## 2016-05-03 NOTE — Progress Notes (Signed)
[place] Progress Note Patient Name: Larry [name] DOB: 10-Oct-1950 MRN: [id number]   Date of Service  05/03/2016  HPI/Events of Note  Anemia with out clear source of bleeding  eICU Interventions  Transfuse 1 U PRBC Occult blood stool     Intervention Category Intermediate Interventions: Diagnostic test evaluation  Simonne Maffucci 05/03/2016, 5:23 AM

## 2016-05-04 ENCOUNTER — Inpatient Hospital Stay (HOSPITAL_COMMUNITY): Payer: Medicare Other

## 2016-05-04 DIAGNOSIS — G9340 Encephalopathy, unspecified: Secondary | ICD-10-CM

## 2016-05-04 LAB — URINALYSIS, ROUTINE W REFLEX MICROSCOPIC
BACTERIA UA: NONE SEEN
Bilirubin Urine: NEGATIVE
Glucose, UA: NEGATIVE mg/dL
KETONES UR: NEGATIVE mg/dL
Leukocytes, UA: NEGATIVE
Nitrite: NEGATIVE
PH: 5 (ref 5.0–8.0)
Protein, ur: 100 mg/dL — AB
SPECIFIC GRAVITY, URINE: 1.018 (ref 1.005–1.030)

## 2016-05-04 LAB — MAGNESIUM: MAGNESIUM: 2.3 mg/dL (ref 1.7–2.4)

## 2016-05-04 LAB — BASIC METABOLIC PANEL
ANION GAP: 8 (ref 5–15)
BUN: 28 mg/dL — ABNORMAL HIGH (ref 6–20)
CO2: 29 mmol/L (ref 22–32)
Calcium: 9.2 mg/dL (ref 8.9–10.3)
Chloride: 110 mmol/L (ref 101–111)
Creatinine, Ser: 1.48 mg/dL — ABNORMAL HIGH (ref 0.61–1.24)
GFR, EST AFRICAN AMERICAN: 56 mL/min — AB (ref >60)
GFR, EST NON AFRICAN AMERICAN: 49 mL/min — AB (ref >60)
GLUCOSE: 155 mg/dL — AB (ref 65–99)
POTASSIUM: 4.6 mmol/L (ref 3.5–5.1)
Sodium: 147 mmol/L — ABNORMAL HIGH (ref 135–145)

## 2016-05-04 LAB — CBC
HCT: 30.8 % — ABNORMAL LOW (ref 39.0–52.0)
Hemoglobin: 9.1 g/dL — ABNORMAL LOW (ref 13.0–17.0)
MCH: 27.6 pg (ref 26.0–34.0)
MCHC: 29.5 g/dL — ABNORMAL LOW (ref 30.0–36.0)
MCV: 93.3 fL (ref 78.0–100.0)
PLATELETS: 286 10*3/uL (ref 150–400)
RBC: 3.3 MIL/uL — AB (ref 4.22–5.81)
RDW: 16.1 % — ABNORMAL HIGH (ref 11.5–15.5)
WBC: 9.4 10*3/uL (ref 4.0–10.5)

## 2016-05-04 LAB — BLOOD GAS, ARTERIAL
Acid-Base Excess: 4.6 mmol/L — ABNORMAL HIGH (ref 0.0–2.0)
BICARBONATE: 29.8 mmol/L — AB (ref 20.0–28.0)
DRAWN BY: 10006
FIO2: 40
O2 Saturation: 94.4 %
PATIENT TEMPERATURE: 98.6
PCO2 ART: 55.1 mmHg — AB (ref 32.0–48.0)
PEEP: 5 cmH2O
PH ART: 7.353 (ref 7.350–7.450)
PO2 ART: 75.4 mmHg — AB (ref 83.0–108.0)
RATE: 14 resp/min
VT: 580 mL

## 2016-05-04 LAB — TYPE AND SCREEN
ABO/RH(D): AB NEG
Antibody Screen: NEGATIVE
PT AG TYPE: NEGATIVE
UNIT DIVISION: 0

## 2016-05-04 LAB — GLUCOSE, CAPILLARY
GLUCOSE-CAPILLARY: 127 mg/dL — AB (ref 65–99)
GLUCOSE-CAPILLARY: 127 mg/dL — AB (ref 65–99)
GLUCOSE-CAPILLARY: 127 mg/dL — AB (ref 65–99)
GLUCOSE-CAPILLARY: 129 mg/dL — AB (ref 65–99)
GLUCOSE-CAPILLARY: 137 mg/dL — AB (ref 65–99)
Glucose-Capillary: 123 mg/dL — ABNORMAL HIGH (ref 65–99)
Glucose-Capillary: 121 mg/dL — ABNORMAL HIGH (ref 65–99)

## 2016-05-04 LAB — BPAM RBC
BLOOD PRODUCT EXPIRATION DATE: 201803152359
ISSUE DATE / TIME: 201803131233
UNIT TYPE AND RH: 9500

## 2016-05-04 LAB — PHOSPHORUS: PHOSPHORUS: 2.7 mg/dL (ref 2.5–4.6)

## 2016-05-04 MED ORDER — CLONAZEPAM 1 MG PO TABS
1.0000 mg | ORAL_TABLET | Freq: Two times a day (BID) | ORAL | Status: DC
  Administered 2016-05-04 – 2016-05-13 (×18): 1 mg
  Filled 2016-05-04 (×19): qty 1

## 2016-05-04 MED ORDER — ADULT MULTIVITAMIN LIQUID CH: 15.0000 mL | Freq: Every day | ORAL | Status: DC

## 2016-05-04 MED ORDER — SIMETHICONE 80 MG PO CHEW
80.0000 mg | CHEWABLE_TABLET | Freq: Four times a day (QID) | ORAL | Status: DC
  Administered 2016-05-04 – 2016-05-13 (×36): 80 mg
  Filled 2016-05-04 (×36): qty 1

## 2016-05-04 MED ORDER — ADULT MULTIVITAMIN LIQUID CH
15.0000 mL | Freq: Every day | ORAL | Status: DC
  Administered 2016-05-05 – 2016-05-13 (×9): 15 mL
  Filled 2016-05-04 (×10): qty 15

## 2016-05-04 MED ORDER — POTASSIUM PHOSPHATES 15 MMOLE/5ML IV SOLN
10.0000 mmol | Freq: Once | INTRAVENOUS | Status: AC
  Administered 2016-05-04: 10 mmol via INTRAVENOUS
  Filled 2016-05-04: qty 3.33

## 2016-05-04 MED ORDER — CLONAZEPAM 1 MG PO TABS
1.0000 mg | ORAL_TABLET | Freq: Two times a day (BID) | ORAL | Status: DC
  Administered 2016-05-04: 1 mg via ORAL
  Filled 2016-05-04: qty 1

## 2016-05-04 MED ORDER — ACETAMINOPHEN 160 MG/5ML PO SOLN
650.0000 mg | Freq: Four times a day (QID) | ORAL | Status: DC | PRN
  Administered 2016-05-04 – 2016-05-05 (×2): 650 mg
  Filled 2016-05-04 (×2): qty 20.3

## 2016-05-04 MED ORDER — QUETIAPINE FUMARATE 25 MG PO TABS
25.0000 mg | ORAL_TABLET | Freq: Two times a day (BID) | ORAL | Status: DC
  Administered 2016-05-04 – 2016-05-05 (×3): 25 mg
  Filled 2016-05-04 (×3): qty 1

## 2016-05-04 MED ORDER — LABETALOL HCL 5 MG/ML IV SOLN
10.0000 mg | INTRAVENOUS | Status: DC | PRN
  Administered 2016-05-05 – 2016-05-10 (×12): 10 mg via INTRAVENOUS
  Filled 2016-05-04 (×11): qty 4

## 2016-05-04 MED ORDER — ASPIRIN 325 MG PO TABS: 325.0000 mg | ORAL_TABLET | Freq: Every day | ORAL | Status: DC

## 2016-05-04 MED ORDER — QUETIAPINE FUMARATE 25 MG PO TABS
25.0000 mg | ORAL_TABLET | Freq: Two times a day (BID) | ORAL | Status: DC
  Administered 2016-05-04: 25 mg via ORAL
  Filled 2016-05-04: qty 1

## 2016-05-04 MED ORDER — LABETALOL HCL 200 MG PO TABS
200.0000 mg | ORAL_TABLET | Freq: Three times a day (TID) | ORAL | Status: DC
  Administered 2016-05-04 – 2016-05-09 (×17): 200 mg
  Filled 2016-05-04 (×18): qty 1

## 2016-05-04 MED ORDER — PANTOPRAZOLE SODIUM 40 MG PO PACK
40.0000 mg | PACK | Freq: Two times a day (BID) | ORAL | Status: DC
  Administered 2016-05-04 – 2016-05-13 (×18): 40 mg
  Filled 2016-05-04 (×18): qty 20

## 2016-05-04 NOTE — Plan of Care (Signed)
Problem: Activity: Goal: Ability to tolerate increased activity will improve Outcome: Not Progressing Unable to tolerate wean today.   Problem: Coping: Goal: Level of anxiety will decrease Outcome: Not Progressing Pt failed wean today. Medications added to help with further anxiety  Problem: Nutritional: Goal: Intake of prescribed amount of daily calories will improve Outcome: Progressing Tolerating goal TF rate  Problem: Respiratory: Goal: Ability to maintain a clear airway and adequate ventilation will improve Outcome: Progressing Still w/ copious secretions. Frequent suctioning  Problem: Role Relationship: Goal: Method of communication will improve Outcome: Not Progressing Unable to follow commands at this time

## 2016-05-04 NOTE — Progress Notes (Signed)
[place] Progress Note Patient Name: Larry [name] DOB: [birth year] MRN: [id number]   Date of Service  05/04/2016  HPI/Events of Note  Fever to 101.4 F. Request to change rectal Tylenol to per tube.    eICU Interventions  Will order: 1. D/C rectal suppository. 2. Tylenol liquid 650 mg per tube Q 6 hours PRN Temp > 101.0 F.  3. UA now. 4. Blood Cultures X 2.  5. Tracheal Aspirate Culture now.      Intervention Category Major Interventions: Other:  Lysle Dingwall 05/04/2016, 7:55 PM

## 2016-05-04 NOTE — Progress Notes (Signed)
GS Progress Note Subjective: Patient looks very comfortable on the ventilator with his tracheostomy.  No acute distress.  Nurses are concerned about his mental status, b ut he has needed to be sedated so much that valid evaluation was not possible.  Hopefully h e will be able to come off sedation enough with the trach that neurologically  He can be evluated well and possible come off the ventilator.  Objective: Vital signs in last 24 hours: Temp:  [98.9 F (37.2 C)-100.9 F (38.3 C)] 100.4 F (38 C) (03/14 0738) Pulse Rate:  [82-133] 87 (03/14 0800) Resp:  [9-29] 25 (03/14 0800) BP: (115-212)/(64-116) 155/79 (03/14 0800) SpO2:  [84 %-100 %] 96 % (03/14 0800) FiO2 (%):  [40 %-100 %] 40 % (03/14 0800) Weight:  [167.4 kg (369 lb)] 167.4 kg (369 lb) (03/14 0426) Last BM Date: 05/03/16  Intake/Output from previous day: 03/13 0701 - 03/14 0700 In: 4756.2 [I.V.:3796.2; Blood:335; NG/GT:625] Out: 3275 [Urine:2875; Stool:400] Intake/Output this shift: Total I/O In: 419.5 [I.V.:322.8; NG/GT:96.7] Out: 310 [Urine:285; Stool:25]  Lungs: Clear, diminished breath sounds.  Abd: softer than yesterday.  400cc output from ileostomy.  Still on TPN for the last day  Extremities: No changes  Neuro: Sedated.  Lab Results: CBC   Recent Labs  05/03/16 0430 05/04/16 0322  WBC 6.8 9.4  HGB 6.5* 9.1*  HCT 23.1* 30.8*  PLT 242 286   BMET  Recent Labs  05/03/16 0742 05/04/16 0322  NA 145 147*  K 4.7 4.6  CL 109 110  CO2 30 29  GLUCOSE 142* 155*  BUN 25* 28*  CREATININE 1.41* 1.48*  CALCIUM 8.7* 9.2   PT/INR  Recent Labs  05/03/16 1045  LABPROT 15.7*  INR 1.25   ABG  Recent Labs  05/03/16 0400 05/04/16 0450  PHART 7.320* 7.353  HCO3 30.8* 29.8*    Studies/Results: Dg Chest Port 1 View  Result Date: 05/04/2016 CLINICAL DATA:  Tracheostomy. EXAM: PORTABLE CHEST 1 VIEW COMPARISON:  05/03/2016. FINDINGS: Tracheostomy and NG tube noted stable position. Cardiomegaly  with pulmonary vascular prominence and bilateral interstitial prominence consistent CHF. Bilateral pleural effusions. Similar findings noted on prior exam. No pneumothorax P IMPRESSION: 1. Tracheostomy tube and NG tube in stable position. 2. Congestive heart failure bilateral pulmonary edema and bilateral pleural effusions. No significant change from prior exam. Electronically Signed   By: Marcello Moores  Register   On: 05/04/2016 07:50   Dg Chest Port 1 View  Result Date: 05/03/2016 CLINICAL DATA:  Tracheostomy . EXAM: PORTABLE CHEST 1 VIEW COMPARISON:  05/03/2016 . FINDINGS: Tracheostomy tube and NG tube in stable position. Cardiomegaly with bilateral pulmonary infiltrates consistent pulmonary edema. Bilateral pleural effusions. Similar findings noted on prior exam . IMPRESSION: 1.  Tracheostomy tube and NG tube in stable position. 2. Cardiomegaly with bilateral pulmonary infiltrates and bilateral pleural effusions consistent congestive heart failure. Exam stable from prior exam. Electronically Signed   By: Marcello Moores  Register   On: 05/03/2016 15:13   Dg Chest Port 1 View  Result Date: 05/03/2016 CLINICAL DATA:  Intubated patient, respiratory failure. EXAM: PORTABLE CHEST 1 VIEW COMPARISON:  Portable chest x-ray of May 02, 2016 FINDINGS: The lungs are well-expanded. The interstitial markings remain increased and the pulmonary vascularity engorged greatest on the right. The cardiac silhouette is mildly enlarged. The pulmonary vascularity is engorged. There is calcification in the wall of the aortic arch. There remains left basilar atelectasis or pneumonia with small left pleural effusion. The endotracheal tube tip measures 6.4 cm above  the carina. The esophagogastric tube tip projects below the inferior margin of the image. The PICC line tip projects over the midportion of the SVC. IMPRESSION: Fairly stable appearance of the chest consistent with CHF and pulmonary interstitial edema and small bilateral pleural  effusions. Persistent left basilar atelectasis or pneumonia. The support tubes are in reasonable position. Thoracic aortic atherosclerosis. Electronically Signed   By: David  Martinique M.D.   On: 05/03/2016 07:23    Anti-infectives: Anti-infectives    Start     Dose/Rate Route Frequency Ordered Stop   04/21/16 1000  anidulafungin (ERAXIS) 100 mg in sodium chloride 0.9 % 100 mL IVPB     100 mg over 90 Minutes Intravenous Every 24 hours 04/20/16 1008 05/02/16 1115   04/20/16 1045  anidulafungin (ERAXIS) 200 mg in sodium chloride 0.9 % 200 mL IVPB     200 mg over 180 Minutes Intravenous  Once 04/20/16 1008 04/20/16 1422   04/19/16 2030  piperacillin-tazobactam (ZOSYN) IVPB 3.375 g     3.375 g 12.5 mL/hr over 240 Minutes Intravenous Every 8 hours 04/19/16 1958 05/02/16 2359   04/19/16 1230  cefoTEtan in Dextrose 5% (CEFOTAN) 2-2.08 GM-% IVPB    Comments:  Ivar Drape   : cabinet override      04/19/16 1230 04/19/16 1245   04/12/16 2000  cefoTEtan (CEFOTAN) 2 g in dextrose 5 % 50 mL IVPB     2 g 100 mL/hr over 30 Minutes Intravenous Every 12 hours 04/12/16 1120 04/12/16 2125   04/12/16 0700  cefoTEtan (CEFOTAN) 2 g in dextrose 5 % 50 mL IVPB     2 g 100 mL/hr over 30 Minutes Intravenous To ShortStay Surgical 04/11/16 1404 04/12/16 0753   04/12/16 0548  cefoTEtan in Dextrose 5% (CEFOTAN) 2-2.08 GM-% IVPB    Comments:  Precious Haws   : cabinet override      04/12/16 0548 04/12/16 1759   04/12/16 0544  neomycin (MYCIFRADIN) tablet 1,000 mg  Status:  Discontinued     1,000 mg Oral 3 times per day 04/12/16 0544 04/12/16 0547   04/12/16 0544  metroNIDAZOLE (FLAGYL) tablet 1,000 mg  Status:  Discontinued     1,000 mg Oral 3 times per day 04/12/16 0544 04/12/16 0547      Assessment/Plan: s/p Procedure(s): EXPLORATORY LAPAROTOMY, REPAIR OF ANASTAMOTIC LEAK COLOSTOMY Advance diet DC TPN as we go to goal on TF.[name] [facility].  [place] off the ventilator.  LOS: 22 days    Kathryne Eriksson. Dahlia Bailiff, MD, FACS [phone] [facility] Surgery 05/04/2016

## 2016-05-04 NOTE — Progress Notes (Signed)
Larry [name] Attending Note  Presenting HPI:  66 y.o. 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 Presented on 2/20 for reversal of colostomy , developed recurrent abdominal pain on 2/26 and taken back to the OR on 2/27 finding free air with peritonitis and anastomotic leakage. Patient returned from the operating room on ventilator and with ongoing shock.  IMAGING/STUDIES: TTE 2/24: LV normal in size with moderate LVH. EF 55-60% with no regional wall motion abnormalities.   MICROBIOLOGY: Blood Cultures x2 2/27 >>>ng Tracheal Aspirate Culture 2/28 >>> nml flora MRSA PCR 2/28:  Negative  Blood Cultures x2 3/2 >>> Urine Culture 3/2 >>>ng  ANTIBIOTICS: Cefotan 2/20 (periop prophylaxis) Zosyn 2/27 >>>D/C after a total of 14 days Eraxis 2/28 >>>D/C after a total of 14 days  SIGNIFICANT EVENTS: 02/20 - Admit for colostomy reversal 02/23 - Postop ileus & tachycardia 02/24 - Cardiology Consulted 02/27 - OR for Exploratory Laparotomy finding anastomotic leak & peritonitis  3/2   transitioned to Precedex infusion and  fentanyl drip, febrile  Subjective:   Trach placed yesterday, not tolerating wean this AM, severely hypertensive overnight  Vent Mode: PRVC FiO2 (%):  [40 %-100 %] 40 % Set Rate:  [14 bmp] 14 bmp Vt Set:  [580 mL] 580 mL PEEP:  [5 cmH20] 5 cmH20 Plateau Pressure:  [24 cmH20-33 cmH20] 24 cmH20  Temp:  [98.9 F (37.2 C)-100.9 F (38.3 C)] 100.4 F (38 C) (03/14 0738) Pulse Rate:  [82-144] 99 (03/14 0900) Resp:  [9-36] 26 (03/14 0900) BP: (115-197)/(64-120) 197/106 (03/14 0900) SpO2:  [84 %-100 %] 95 % (03/14 0900) FiO2 (%):  [40 %-100 %] 40 % (03/14 0845) Weight:  [167.4 kg (369 lb)] 167.4 kg (369 lb) (03/14 0426)  General: Non-purposeful, agitated, weaning Neuro: Withdraws to pain but not following commands HEENT: [place]/AT, PERRL, EOM-spontaneous and MMM PULM: Coarse BS diffusely CV:  IRIR (PVC's on monitor), Nl S1/S2, -M/R/G. GI: Soft, decreased BS, wound ok, distended Extremities: 1+ edema  LINES/TUBES: OETT 7.5 2/27 >>> R IJ CVL 2/27 >>> LUE DL PICC 2/26 >>> Foley >>> L NGT 2/27 >>> L RADIAL ART LINE 2/27 >>> discontinued PIV  CBC Latest Ref Rng & Units 05/04/2016 05/03/2016 05/03/2016  WBC 4.0 - 10.5 K/uL 9.4 6.8 6.1  Hemoglobin 13.0 - 17.0 g/dL 9.1(L) 6.5(LL) 6.2(LL)  Hematocrit 39.0 - 52.0 % 30.8(L) 23.1(L) 22.2(L)  Platelets 150 - 400 K/uL 286 242 211   BMP Latest Ref Rng & Units 05/04/2016 05/03/2016 05/03/2016  Glucose 65 - 99 mg/dL 155(H) 142(H) 488(H)  BUN 6 - 20 mg/dL 28(H) 25(H) 24(H)  Creatinine 0.61 - 1.24 mg/dL 1.48(H) 1.41(H) 1.36(H)  Sodium 135 - 145 mmol/L 147(H) 145 140  Potassium 3.5 - 5.1 mmol/L 4.6 4.7 5.1  Chloride 101 - 111 mmol/L 110 109 107  CO2 22 - 32 mmol/L 29 30 27   Calcium 8.9 - 10.3 mg/dL 9.2 8.7(L) 7.9(L)   Hepatic Function Latest Ref Rng & Units 05/02/2016 05/01/2016 04/28/2016  Total Protein 6.5 - 8.1 g/dL 5.7(L) 6.5 5.4(L)  Albumin 3.5 - 5.0 g/dL 1.7(L) 1.8(L) 1.5(L)  AST 15 - 41 U/L 50(H) 35 24  ALT 17 - 63 U/L 49 38 26  Alk Phosphatase 38 - 126 U/L 103 103 81  Total Bilirubin 0.3 - 1.2 mg/dL 0.5 0.7 0.5  Bilirubin, Direct <=0.2 mg/dL - - -   ASSESSMENT/PLAN:  66 y.o. male, initially admitted for reversal of ostomy, ended up with peritonitis  from bowel anastomotic leak. Off pressors & improving renal function. TF stopped on 3/5  because of concern for abd distention.  TF started at trickle.  1. S/P Septic shock (resolved) secondary to peritonitis.  Abx, course complete 2. Acute on chronic hypoxemic and hypercarbic respiratory failure: Trach 3/13, proceed with weaning as able 3. Sedation for severe agitation, will start PO Klonopin and seroquel to hopefully decrease IV sedation need 4. Acute on chronic stage III renal failure, Cr plateaued but UOP is improving, will hold lasix for now, consider adding lasix in  AM 5. Hyperglycemia, monitor 6. History of coronary artery disease: ASA daily 7. HTN: add PO labetalol 200 mg PO TID with holding parameters  The patient is critically ill with multiple organ systems failure and requires high complexity decision making for assessment and support, frequent evaluation and titration of therapies, application of advanced monitoring technologies and extensive interpretation of multiple databases.   Critical [name] Time devoted to patient [name] services described in this note is  35  Minutes. This time reflects time of [name] of this signee Dr Jennet Maduro. This critical [name] time does not reflect procedure time, or teaching time or supervisory time of PA/NP/Med student/Med Resident etc but could involve [name] discussion time.  Rush Farmer, M.D. [facility] Pulmonary/Critical [name] Medicine. Pager: [phone]. After hours pager: [phone].

## 2016-05-04 NOTE — Plan of Care (Signed)
Problem: Activity: Goal: Ability to tolerate increased activity will improve Outcome: Progressing Pt trached 05/03/2016 to facilitate wean  Problem: Coping: Goal: Level of anxiety will decrease Outcome: Not Progressing Pt continues to require a large amount of sedation  Problem: Nutritional: Goal: Intake of prescribed amount of daily calories will improve Outcome: Progressing Pt tolerating increase in tube feeds  Problem: Respiratory: Goal: Ability to maintain a clear airway and adequate ventilation will improve Outcome: Progressing Large amount of secretions. Requires frequent suctioning.  Problem: Role Relationship: Goal: Method of communication will improve Outcome: Not Progressing Unable to follow commands

## 2016-05-04 NOTE — Progress Notes (Signed)
PHARMACY - ADULT TOTAL PARENTERAL NUTRITION CONSULT NOTE   Pharmacy Consult for TPN Indication: Prolonged ileus  Patient Measurements: Height: 5' 10.5" (179.1 cm) Weight: (!) 369 lb (167.4 kg) IBW/kg (Calculated) : 74.15 TPN AdjBW (KG): 101.4 Body mass index is 52.2 kg/m.  Assessment: 69 YOM with history of an obstructing tumor s/p emergent colectomy with colostomy of right transverse colon in 2014. Patient presented on 04/12/16 for reversal of colostomy. Postoperatively, he had emesis on 04/14/16 and a distended abdomen on 04/15/16. He has consumed </= 10% of his meals for 7 days and Pharmacy consulted to manage TPN for prolonged ileus. Patient reports eating regularly and gained 3-4 lbs before admission.  GI:s/p ex-lap and repair of leak on 2/27. CT 3/8 shows possible mild SBO and small volume of abd free fluid.  LBM 3/13. Ileostomy 444mL. FOB +. Abd wound dehisced 75% per surgery. Prealbumin improved 10.4 > 15.1. Albumin 2 >1.7, weight 171 kg > 185 kg (likely dt fluid). Simethicone, PPI IV>PT, Vital AF 1.2 started 3/11. Endo:No hx DM, A1c 5.5% - CBGs controlled  Insulin requirements in the past 24 hours:4units of SSI Lytes:Na 147, K 4.6, Mag 2.3, Phos 2.7 Renal:CKD3 - SCr up slightly to 1.48, BUN 28- UOP 0.7 mL/kg/hr, 1/2NS at [facility]. Flomax, bethanechol.  Pulm:intubated 2/27 - FiO2 40%. Trach 3/13  Cards:CM / CHF (EF 55-60%) / CAD post PCI / HLD / HTN / PAD / hx atrial tachycardia and torsades. BP & HR labile- metoprolol IV, ASA PR. CVP 20, +21L since adm.  Hepatobil:LFTs / tbili WNL (AST up slightly at 50). TG elevated 183 > 246.  Neuro:High dose Fentanyl 456mcg/hr. Transitioned Propofol to Precedex.  ID:s/p Zosyn/Eraxis for sepsis/peritonitis. Afebrile (100.4), WBC WNL. Cultures NGTD. Heme/Onc: Hx colon cancer - hgb up to 9.1 (s/p 1 unit PRBC 3/13), plts normal Best Practices:heparin SQ, CHG/mupirocin TPN Access: PICC placed 04/18/16 TPN start  date:04/18/16  Nutritional Goals: per RD recs on 3/12 5809-9833 kCal and >/= 187 gm protein per day  Current Nutrition:  Clinimix E5/15  Vital AF 1.2 at 24mL/hr + Prostat 15mL TID  Plan: D/C TPN tonight (when current bag completed) at 1800. Dr. Hulen Skains aware.  Vital AF 1.2 at 50 ml/hr + Prostat will provide 2040 kCal and 180 g of protein per day, meeting ~100% of kCal and protein needs. Change daily multivitamin to PT Kphos 10 mmol IV x1  May consider increasing free water with TF for hypernatremia  Continue SSI Q6H, may discontinue as needed   Pharmacy to sign-off, please re-consult if needed.   Argie Ramming, PharmD Pharmacy Resident  Pager [phone] 05/04/16 7:33 AM

## 2016-05-05 DIAGNOSIS — R109 Unspecified abdominal pain: Secondary | ICD-10-CM

## 2016-05-05 LAB — CBC
HEMATOCRIT: 25.2 % — AB (ref 39.0–52.0)
HEMOGLOBIN: 7.3 g/dL — AB (ref 13.0–17.0)
MCH: 26.9 pg (ref 26.0–34.0)
MCHC: 29 g/dL — AB (ref 30.0–36.0)
MCV: 93 fL (ref 78.0–100.0)
Platelets: 210 10*3/uL (ref 150–400)
RBC: 2.71 MIL/uL — AB (ref 4.22–5.81)
RDW: 16.2 % — ABNORMAL HIGH (ref 11.5–15.5)
WBC: 8.7 10*3/uL (ref 4.0–10.5)

## 2016-05-05 LAB — BASIC METABOLIC PANEL WITH GFR
Anion gap: 7 (ref 5–15)
BUN: 26 mg/dL — ABNORMAL HIGH (ref 6–20)
CO2: 23 mmol/L (ref 22–32)
Calcium: 7.4 mg/dL — ABNORMAL LOW (ref 8.9–10.3)
Chloride: 116 mmol/L — ABNORMAL HIGH (ref 101–111)
Creatinine, Ser: 1.15 mg/dL (ref 0.61–1.24)
GFR calc Af Amer: >60 mL/min
GFR calc non Af Amer: >60 mL/min
Glucose, Bld: 113 mg/dL — ABNORMAL HIGH (ref 65–99)
Potassium: 3.5 mmol/L (ref 3.5–5.1)
Sodium: 146 mmol/L — ABNORMAL HIGH (ref 135–145)

## 2016-05-05 LAB — GLUCOSE, CAPILLARY
GLUCOSE-CAPILLARY: 138 mg/dL — AB (ref 65–99)
Glucose-Capillary: 132 mg/dL — ABNORMAL HIGH (ref 65–99)
Glucose-Capillary: 117 mg/dL — ABNORMAL HIGH (ref 65–99)
Glucose-Capillary: 130 mg/dL — ABNORMAL HIGH (ref 65–99)

## 2016-05-05 LAB — PHOSPHORUS: Phosphorus: 2.2 mg/dL — ABNORMAL LOW (ref 2.5–4.6)

## 2016-05-05 LAB — MAGNESIUM: Magnesium: 1.8 mg/dL (ref 1.7–2.4)

## 2016-05-05 MED ORDER — HYDRALAZINE HCL 20 MG/ML IJ SOLN
10.0000 mg | INTRAMUSCULAR | Status: DC | PRN
  Administered 2016-05-05 – 2016-05-10 (×5): 10 mg via INTRAVENOUS
  Filled 2016-05-05 (×3): qty 1
  Filled 2016-05-05: qty 0.5
  Filled 2016-05-05 (×3): qty 1

## 2016-05-05 NOTE — Progress Notes (Signed)
ELink MD notified of persistent htn in spite of PRN meds and intermittent tachycardia. Orders received. Will continue to monitor. Eleonore Chiquito RN 2 [place]

## 2016-05-05 NOTE — Progress Notes (Signed)
GS Progress Note Subjective: Patient gets very agitated and thachycardic and hypertensive when attempts are made to wean.  Currently calm and in no distress.  Objective: Vital signs in last 24 hours: Temp:  [100.6 F (38.1 C)-101.4 F (38.6 C)] 100.8 F (38.2 C) (03/15 0801) Pulse Rate:  [87-144] 88 (03/15 0700) Resp:  [17-36] 21 (03/15 0700) BP: (139-197)/(69-120) 156/82 (03/15 0700) SpO2:  [93 %-100 %] 98 % (03/15 0700) FiO2 (%):  [40 %] 40 % (03/15 0414) Weight:  [168.7 kg (372 lb)] 168.7 kg (372 lb) (03/15 0500) Last BM Date: 05/04/16  Intake/Output from previous day: 03/14 0701 - 03/15 0700 In: 5115.6 [I.V.:3269.8; NG/GT:1590.8; IV Piggyback:255] Out: 2585 [Urine:3385; Drains:5; Stool:825] Intake/Output this shift: No intake/output data recorded.  Lungs: Clear to auscultation.  Abd: Soft as yesterday, good bowel sounds.  350cc output from ileostomy.  Stoma is viable.  Midline wound is stretched out and dehisced, but without evisceration.  Suture is pulled apart, but none are cutting across the underneath bowel.    Extremities: No changes  Neuro: Sedated  Lab Results: CBC   Recent Labs  05/04/16 0322 05/05/16 0418  WBC 9.4 8.7  HGB 9.1* 7.3*  HCT 30.8* 25.2*  PLT 286 210   BMET  Recent Labs  05/04/16 0322 05/05/16 0418  NA 147* 146*  K 4.6 3.5  CL 110 116*  CO2 29 23  GLUCOSE 155* 113*  BUN 28* 26*  CREATININE 1.48* 1.15  CALCIUM 9.2 7.4*   PT/INR  Recent Labs  05/03/16 1045  LABPROT 15.7*  INR 1.25   ABG  Recent Labs  05/03/16 0400 05/04/16 0450  PHART 7.320* 7.353  HCO3 30.8* 29.8*    Studies/Results: Dg Chest Port 1 View  Result Date: 05/04/2016 CLINICAL DATA:  Tracheostomy. EXAM: PORTABLE CHEST 1 VIEW COMPARISON:  05/03/2016. FINDINGS: Tracheostomy and NG tube noted stable position. Cardiomegaly with pulmonary vascular prominence and bilateral interstitial prominence consistent CHF. Bilateral pleural effusions. Similar findings  noted on prior exam. No pneumothorax P IMPRESSION: 1. Tracheostomy tube and NG tube in stable position. 2. Congestive heart failure bilateral pulmonary edema and bilateral pleural effusions. No significant change from prior exam. Electronically Signed   By: Marcello Moores  Register   On: 05/04/2016 07:50   Dg Chest Port 1 View  Result Date: 05/03/2016 CLINICAL DATA:  Tracheostomy . EXAM: PORTABLE CHEST 1 VIEW COMPARISON:  05/03/2016 . FINDINGS: Tracheostomy tube and NG tube in stable position. Cardiomegaly with bilateral pulmonary infiltrates consistent pulmonary edema. Bilateral pleural effusions. Similar findings noted on prior exam . IMPRESSION: 1.  Tracheostomy tube and NG tube in stable position. 2. Cardiomegaly with bilateral pulmonary infiltrates and bilateral pleural effusions consistent congestive heart failure. Exam stable from prior exam. Electronically Signed   By: Marcello Moores  Register   On: 05/03/2016 15:13   Dg Abd Portable 1v  Result Date: 05/04/2016 CLINICAL DATA:  Feeding tube placement EXAM: PORTABLE ABDOMEN - 1 VIEW COMPARISON:  04/30/2016 FINDINGS: NG tube is in place with the tip in the distal stomach. IMPRESSION: NG tube tip in the distal stomach. Electronically Signed   By: Rolm Baptise M.D.   On: 05/04/2016 17:19    Anti-infectives: Anti-infectives    Start     Dose/Rate Route Frequency Ordered Stop   04/21/16 1000  anidulafungin (ERAXIS) 100 mg in sodium chloride 0.9 % 100 mL IVPB     100 mg over 90 Minutes Intravenous Every 24 hours 04/20/16 1008 05/02/16 1115   04/20/16 1045  anidulafungin (ERAXIS)  200 mg in sodium chloride 0.9 % 200 mL IVPB     200 mg over 180 Minutes Intravenous  Once 04/20/16 1008 04/20/16 1422   04/19/16 2030  piperacillin-tazobactam (ZOSYN) IVPB 3.375 g     3.375 g 12.5 mL/hr over 240 Minutes Intravenous Every 8 hours 04/19/16 1958 05/02/16 2359   04/19/16 1230  cefoTEtan in Dextrose 5% (CEFOTAN) 2-2.08 GM-% IVPB    Comments:  Ivar Drape   : cabinet  override      04/19/16 1230 04/19/16 1245   04/12/16 2000  cefoTEtan (CEFOTAN) 2 g in dextrose 5 % 50 mL IVPB     2 g 100 mL/hr over 30 Minutes Intravenous Every 12 hours 04/12/16 1120 04/12/16 2125   04/12/16 0700  cefoTEtan (CEFOTAN) 2 g in dextrose 5 % 50 mL IVPB     2 g 100 mL/hr over 30 Minutes Intravenous To [facility] 04/11/16 1404 04/12/16 0753   04/12/16 0548  cefoTEtan in Dextrose 5% (CEFOTAN) 2-2.08 GM-% IVPB    Comments:  Precious Haws   : cabinet override      04/12/16 0548 04/12/16 1759   04/12/16 0544  neomycin (MYCIFRADIN) tablet 1,000 mg  Status:  Discontinued     1,000 mg Oral 3 times per day 04/12/16 0544 04/12/16 0547   04/12/16 0544  metroNIDAZOLE (FLAGYL) tablet 1,000 mg  Status:  Discontinued     1,000 mg Oral 3 times per day 04/12/16 0544 04/12/16 0547      Assessment/Plan: s/p Procedure(s): EXPLORATORY LAPAROTOMY, REPAIR OF ANASTAMOTIC LEAK COLOSTOMY Hemoglboin and platelet count have dropped this morning.  Will need to keep an eye on this and transfusie if needed for hemoglobin below  7.  LOS: 23 days    Kathryne Eriksson. Dahlia Bailiff, MD, FACS [phone] [facility] 05/05/2016

## 2016-05-05 NOTE — Progress Notes (Signed)
Nutrition Follow Up  DOCUMENTATION CODES:   Morbid obesity  INTERVENTION:    Continue Vital AF 1.2 formula at goal of rate of 60 ml/hr with Prostat liquid protein 60 ml TID  TF regimen providing 2328 kcals, 198 gm protein, 1168 ml of free water  NUTRITION DIAGNOSIS:   Inadequate oral intake related to inability to eat as evidenced by NPO status; ongoing  GOAL:   Provide needs based on ASPEN/SCCM guidelines; met  MONITOR:   TF tolerance, Vent status, Labs, Weight trends, Skin, I & O's  ASSESSMENT:   66 yo Male who is several years out from an emergency colectomy with colostomy of the right transverse colon for an obstructing tumor; presented for reversal of his colostomy.  Pt s/p procedure 2/21: REVERSAL OF COLOSTOMY  Patient is currently on ventilator support Temp (24hrs), Avg:101 F (38.3 C), Min:100.6 F (38.1 C), Max:101.4 F (38.6 C)  Pt s/p bedside tracheostomy 3/13. TPN discontinued 3/14. Vital AF 1.2 formula currently infusing at 60 ml/hr via CORTRAK small bore feeding tube (tip in distal stomach); tolerating well. Labs reviewed.  Phosphorus (L).  Sodium 146 (H). CBG's [id number].  Diet Order:  Diet NPO time specified  Skin:  Wound (see comment) (Stage I to lip)  Last BM:  3/14  Height:   Ht Readings from Last 1 Encounters:  04/22/16 5' 10.5" (1.791 m)    Weight:   Wt Readings from Last 1 Encounters:  05/05/16 (!) 372 lb (168.7 kg)  Admit weight 04/12/16 377 lbs (171 kg)  Ideal Body Weight:  75 kg  BMI:  Body mass index is 52.62 kg/m.  Estimated Nutritional Needs:   Kcal:  0156-1537  Protein:  >/= 187 gm  Fluid:  per MD  EDUCATION NEEDS:   No education needs identified at this time  [name], RD, LDN Pager #: [phone] After-Hours Pager #: [phone]

## 2016-05-05 NOTE — Progress Notes (Signed)
Per RN- hold wean currently d/t  pt is agitated, and HR 130's.

## 2016-05-05 NOTE — Progress Notes (Signed)
[facility] Attending Note  Presenting HPI:  66 y.o. 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 Presented on 2/20 for reversal of colostomy , developed recurrent abdominal pain on 2/26 and taken back to the OR on 2/27 finding free air with peritonitis and anastomotic leakage. Patient returned from the operating room on ventilator and with ongoing shock.  IMAGING/STUDIES: TTE 2/24: LV normal in size with moderate LVH. EF 55-60% with no regional wall motion abnormalities.   MICROBIOLOGY: Blood Cultures x2 2/27 >>>ng Tracheal Aspirate Culture 2/28 >>> nml flora MRSA PCR 2/28:  Negative  Blood Cultures x2 3/2 >>> Urine Culture 3/2 >>>ng  ANTIBIOTICS: Cefotan 2/20 (periop prophylaxis) Zosyn 2/27 >>>D/C after a total of 14 days Eraxis 2/28 >>>D/C after a total of 14 days  SIGNIFICANT EVENTS: 02/20 - Admit for colostomy reversal 02/23 - Postop ileus & tachycardia 02/24 - Cardiology Consulted 02/27 - OR for Exploratory Laparotomy finding anastomotic leak & peritonitis  3/2   transitioned to Precedex infusion and  fentanyl drip, febrile  Subjective:   Trach placed yesterday, not tolerating wean this AM, severely hypertensive overnight  Vent Mode: PRVC FiO2 (%):  [40 %] 40 % Set Rate:  [14 bmp] 14 bmp Vt Set:  [580 mL] 580 mL PEEP:  [5 cmH20] 5 cmH20 Plateau Pressure:  [18 cmH20-29 cmH20] 29 cmH20  Temp:  [100.6 F (38.1 C)-101.4 F (38.6 C)] 100.8 F (38.2 C) (03/15 0801) Pulse Rate:  [87-144] 88 (03/15 0700) Resp:  [17-36] 21 (03/15 0700) BP: (139-197)/(69-120) 156/82 (03/15 0700) SpO2:  [93 %-100 %] 98 % (03/15 0700) FiO2 (%):  [40 %] 40 % (03/15 0414) Weight:  [168.7 kg (372 lb)] 168.7 kg (372 lb) (03/15 0500)  General: Non-purposeful, agitated, weaning Neuro: Withdraws to pain but not following commands HEENT: [place]/AT, PERRL, EOM-spontaneous and MMM PULM: Coarse BS diffusely CV: IRIR  (PVC's on monitor), Nl S1/S2, -M/R/G. GI: Soft, decreased BS, wound ok, distended Extremities: 1+ edema  LINES/TUBES: OETT 7.5 2/27 >>> R IJ CVL 2/27 >>> LUE DL PICC 2/26 >>> Foley >>> L NGT 2/27 >>> L RADIAL ART LINE 2/27 >>> discontinued PIV  CBC Latest Ref Rng & Units 05/05/2016 05/04/2016 05/03/2016  WBC 4.0 - 10.5 K/uL 8.7 9.4 6.8  Hemoglobin 13.0 - 17.0 g/dL 7.3(L) 9.1(L) 6.5(LL)  Hematocrit 39.0 - 52.0 % 25.2(L) 30.8(L) 23.1(L)  Platelets 150 - 400 K/uL 210 286 242   BMP Latest Ref Rng & Units 05/05/2016 05/04/2016 05/03/2016  Glucose 65 - 99 mg/dL 113(H) 155(H) 142(H)  BUN 6 - 20 mg/dL 26(H) 28(H) 25(H)  Creatinine 0.61 - 1.24 mg/dL 1.15 1.48(H) 1.41(H)  Sodium 135 - 145 mmol/L 146(H) 147(H) 145  Potassium 3.5 - 5.1 mmol/L 3.5 4.6 4.7  Chloride 101 - 111 mmol/L 116(H) 110 109  CO2 22 - 32 mmol/L 23 29 30   Calcium 8.9 - 10.3 mg/dL 7.4(L) 9.2 8.7(L)   Hepatic Function Latest Ref Rng & Units 05/02/2016 05/01/2016 04/28/2016  Total Protein 6.5 - 8.1 g/dL 5.7(L) 6.5 5.4(L)  Albumin 3.5 - 5.0 g/dL 1.7(L) 1.8(L) 1.5(L)  AST 15 - 41 U/L 50(H) 35 24  ALT 17 - 63 U/L 49 38 26  Alk Phosphatase 38 - 126 U/L 103 103 81  Total Bilirubin 0.3 - 1.2 mg/dL 0.5 0.7 0.5  Bilirubin, Direct <=0.2 mg/dL - - -   ASSESSMENT/PLAN:  66 y.o. male, initially admitted for reversal of ostomy, ended up with peritonitis from bowel  anastomotic leak. Off pressors & improving renal function. TF stopped on 3/5  because of concern for abd distention.  TF re-started at trickle.  1. S/P Septic shock (resolved) secondary to peritonitis.  Abx, course complete 2. Fevers on 3/14.  R/O new infection.  Cultures are pending so far.  Holding off on abx unless he clinically deteriorates. 3. Acute on chronic hypoxemic and hypercarbic respiratory failure: Trach 3/13, proceed with weaning as able. Pt currently is tachypneic and tachycardic and sedated on the vent.  Try to wean off sedation to facilitate weaning.  4. Sedation  for severe agitation. Cont PO Klonopin and seroquel to hopefully decrease IV sedation need (started on 3/14) 5. Acute on chronic stage III renal failure, Cr plateaued but UOP is improving, will hold lasix for now. May need diuresis in am.  6. Hyperglycemia, monitor 7. History of coronary artery disease: ASA daily 8. HTN: cont PO labetalol 200 mg PO TID with holding parameters. PRN labetalol and hydralazine.     I spent  30  minutes of Critical Care time with this patient today.   Monica Becton, MD 05/05/2016, 8:18 AM [facility] Pager [phone] After 3 pm or if no answer, call [phone]

## 2016-05-05 NOTE — Consult Note (Addendum)
[place] Nurse wound follow up Wound type: RUQ ileostomy Measurement: Wound bed: Drainage (amount, consistency, odor)  Periwound: Dressing procedure/placement/frequency: Follow up visit made, bedside nurse present.  Ostomy pouch intact with no leakage.  Bedside nurse said it has been being changed daily due to leakage and she was not sure if it had been changed on prior shift. She complained of it leaking out of the bottom of the bag, not at the stomal site.  This bag has been secured correctly and is not leaking at this time. We will continue to follow this patient and remain available to this patient, nursing, and the medical and surgical teams.    Fara Olden, RN-C, WTA-C Wound Treatment Associate

## 2016-05-06 ENCOUNTER — Inpatient Hospital Stay (HOSPITAL_COMMUNITY): Payer: Medicare Other

## 2016-05-06 DIAGNOSIS — J810 Acute pulmonary edema: Secondary | ICD-10-CM

## 2016-05-06 LAB — BASIC METABOLIC PANEL
ANION GAP: 8 (ref 5–15)
ANION GAP: 4 — AB (ref 5–15)
BUN: 31 mg/dL — ABNORMAL HIGH (ref 6–20)
BUN: 28 mg/dL — ABNORMAL HIGH (ref 6–20)
CALCIUM: 7.7 mg/dL — AB (ref 8.9–10.3)
CHLORIDE: 119 mmol/L — AB (ref 101–111)
CO2: 26 mmol/L (ref 22–32)
CO2: 26 mmol/L (ref 22–32)
CREATININE: 1.21 mg/dL (ref 0.61–1.24)
Calcium: 7.6 mg/dL — ABNORMAL LOW (ref 8.9–10.3)
Chloride: 115 mmol/L — ABNORMAL HIGH (ref 101–111)
Creatinine, Ser: 1.17 mg/dL (ref 0.61–1.24)
GFR calc Af Amer: >60 mL/min (ref >60)
GFR calc Af Amer: >60 mL/min (ref >60)
GFR calc non Af Amer: >60 mL/min (ref >60)
GLUCOSE: 124 mg/dL — AB (ref 65–99)
GLUCOSE: 120 mg/dL — AB (ref 65–99)
POTASSIUM: 3.7 mmol/L (ref 3.5–5.1)
Potassium: 3.7 mmol/L (ref 3.5–5.1)
Sodium: 149 mmol/L — ABNORMAL HIGH (ref 135–145)
Sodium: 149 mmol/L — ABNORMAL HIGH (ref 135–145)

## 2016-05-06 LAB — CBC
HEMATOCRIT: 24.1 % — AB (ref 39.0–52.0)
Hemoglobin: 7.1 g/dL — ABNORMAL LOW (ref 13.0–17.0)
MCH: 27.7 pg (ref 26.0–34.0)
MCHC: 29.5 g/dL — AB (ref 30.0–36.0)
MCV: 94.1 fL (ref 78.0–100.0)
Platelets: 183 10*3/uL (ref 150–400)
RBC: 2.56 MIL/uL — ABNORMAL LOW (ref 4.22–5.81)
RDW: 16.7 % — AB (ref 11.5–15.5)
WBC: 7.9 10*3/uL (ref 4.0–10.5)

## 2016-05-06 LAB — GLUCOSE, CAPILLARY
GLUCOSE-CAPILLARY: 118 mg/dL — AB (ref 65–99)
GLUCOSE-CAPILLARY: 116 mg/dL — AB (ref 65–99)
Glucose-Capillary: 103 mg/dL — ABNORMAL HIGH (ref 65–99)
Glucose-Capillary: 115 mg/dL — ABNORMAL HIGH (ref 65–99)
Glucose-Capillary: 119 mg/dL — ABNORMAL HIGH (ref 65–99)
Glucose-Capillary: 128 mg/dL — ABNORMAL HIGH (ref 65–99)
Glucose-Capillary: 131 mg/dL — ABNORMAL HIGH (ref 65–99)

## 2016-05-06 LAB — MAGNESIUM
MAGNESIUM: 1.9 mg/dL (ref 1.7–2.4)
Magnesium: 2 mg/dL (ref 1.7–2.4)

## 2016-05-06 LAB — PHOSPHORUS: Phosphorus: 2.9 mg/dL (ref 2.5–4.6)

## 2016-05-06 MED ORDER — FENTANYL 75 MCG/HR TD PT72
100.0000 ug | MEDICATED_PATCH | TRANSDERMAL | Status: DC
  Administered 2016-05-06 – 2016-05-12 (×3): 100 ug via TRANSDERMAL
  Filled 2016-05-06 (×5): qty 1

## 2016-05-06 MED ORDER — DEXMEDETOMIDINE HCL IN NACL 400 MCG/100ML IV SOLN
0.4000 ug/kg/h | INTRAVENOUS | Status: DC
  Filled 2016-05-06: qty 100

## 2016-05-06 MED ORDER — INSULIN ASPART 100 UNIT/ML ~~LOC~~ SOLN
0.0000 [IU] | SUBCUTANEOUS | Status: DC
  Administered 2016-05-07 – 2016-05-10 (×16): 1 [IU] via SUBCUTANEOUS
  Administered 2016-05-10: 2 [IU] via SUBCUTANEOUS
  Administered 2016-05-10 – 2016-05-12 (×2): 1 [IU] via SUBCUTANEOUS

## 2016-05-06 MED ORDER — DEXMEDETOMIDINE HCL IN NACL 400 MCG/100ML IV SOLN
0.4000 ug/kg/h | INTRAVENOUS | Status: DC
  Administered 2016-05-06 (×8): 1.5 ug/kg/h via INTRAVENOUS
  Administered 2016-05-07: 1.8 ug/kg/h via INTRAVENOUS
  Administered 2016-05-07: 1.5 ug/kg/h via INTRAVENOUS
  Administered 2016-05-07: 1.6 ug/kg/h via INTRAVENOUS
  Administered 2016-05-07 (×3): 1.7 ug/kg/h via INTRAVENOUS
  Administered 2016-05-07: 1.6 ug/kg/h via INTRAVENOUS
  Administered 2016-05-07: 1.7 ug/kg/h via INTRAVENOUS
  Administered 2016-05-07: 1.8 ug/kg/h via INTRAVENOUS
  Administered 2016-05-07 (×2): 1.6 ug/kg/h via INTRAVENOUS
  Administered 2016-05-07: 1.7 ug/kg/h via INTRAVENOUS
  Administered 2016-05-07 (×2): 1.6 ug/kg/h via INTRAVENOUS
  Administered 2016-05-08: 1.2 ug/kg/h via INTRAVENOUS
  Administered 2016-05-08: 1.6 ug/kg/h via INTRAVENOUS
  Administered 2016-05-08: 1 ug/kg/h via INTRAVENOUS
  Administered 2016-05-08 (×4): 1.6 ug/kg/h via INTRAVENOUS
  Administered 2016-05-08: 1.2 ug/kg/h via INTRAVENOUS
  Administered 2016-05-08: 1.6 ug/kg/h via INTRAVENOUS
  Administered 2016-05-08 (×2): 1 ug/kg/h via INTRAVENOUS
  Administered 2016-05-09: 0.5 ug/kg/h via INTRAVENOUS
  Administered 2016-05-09 (×3): 1 ug/kg/h via INTRAVENOUS
  Filled 2016-05-06 (×39): qty 100

## 2016-05-06 MED ORDER — QUETIAPINE FUMARATE 25 MG PO TABS
50.0000 mg | ORAL_TABLET | Freq: Two times a day (BID) | ORAL | Status: DC
  Administered 2016-05-06: 50 mg
  Administered 2016-05-06: 25 mg
  Administered 2016-05-07 – 2016-05-09 (×6): 50 mg
  Filled 2016-05-06 (×8): qty 2

## 2016-05-06 MED ORDER — POTASSIUM CHLORIDE 20 MEQ/15ML (10%) PO SOLN
40.0000 meq | Freq: Once | ORAL | Status: AC
  Administered 2016-05-06: 40 meq
  Filled 2016-05-06: qty 30

## 2016-05-06 MED ORDER — SODIUM CHLORIDE 0.9 % IV SOLN
0.4000 ug/kg/h | INTRAVENOUS | Status: DC
  Filled 2016-05-06: qty 10

## 2016-05-06 MED ORDER — FUROSEMIDE 10 MG/ML IJ SOLN
20.0000 mg | Freq: Every day | INTRAMUSCULAR | Status: DC
  Administered 2016-05-06: 20 mg via INTRAVENOUS
  Filled 2016-05-06: qty 2

## 2016-05-06 MED ORDER — DEXMEDETOMIDINE HCL IN NACL 400 MCG/100ML IV SOLN
0.4000 ug/kg/h | INTRAVENOUS | Status: DC
  Administered 2016-05-06: 1.5 ug/kg/h via INTRAVENOUS
  Filled 2016-05-06: qty 100

## 2016-05-06 NOTE — Progress Notes (Signed)
Pulmonary & Critical Care Attending Note  Presenting HPI:  66 y.o. 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 Presented on 2/20 for reversal of colostomy , developed recurrent abdominal pain on 2/26 and taken back to the OR on 2/27 finding free air with peritonitis and anastomotic leakage. Patient returned from the operating room on ventilator and with ongoing shock.  IMAGING/STUDIES: TTE 2/24: LV normal in size with moderate LVH. EF 55-60% with no regional wall motion abnormalities.   MICROBIOLOGY: Blood Cultures x2 2/27 >>>ng Tracheal Aspirate Culture 2/28 >>> nml flora MRSA PCR 2/28:  Negative  Blood Cultures x2 3/2 >>> Urine Culture 3/2 >>>ng  ANTIBIOTICS: Cefotan 2/20 (periop prophylaxis) Zosyn 2/27 >>>D/C after a total of 14 days Eraxis 2/28 >>>D/C after a total of 14 days  SIGNIFICANT EVENTS: 02/20 - Admit for colostomy reversal 02/23 - Postop ileus & tachycardia 02/24 - Cardiology Consulted 02/27 - OR for Exploratory Laparotomy finding anastomotic leak & peritonitis  3/2   transitioned to Precedex infusion and  fentanyl drip, febrile  Subjective:   Sedation/agitation issues last 24 hrs (either he is too sedated or too agitated) Weaned on vent, not ATC on 3/15.  Tolerating TF   Vent Mode: PRVC FiO2 (%):  [40 %] 40 % Set Rate:  [14 bmp] 14 bmp Vt Set:  [580 mL] 580 mL PEEP:  [5 cmH20] 5 cmH20 Pressure Support:  [10 cmH20] 10 cmH20 Plateau Pressure:  [23 cmH20-30 cmH20] 25 cmH20  Temp:  [99.9 F (37.7 C)-101.1 F (38.4 C)] 99.9 F (37.7 C) (03/16 0716) Pulse Rate:  [84-137] 131 (03/16 0716) Resp:  [9-28] 18 (03/16 0716) BP: (115-190)/(68-112) 134/92 (03/16 0716) SpO2:  [94 %-99 %] 98 % (03/16 0716) FiO2 (%):  [40 %] 40 % (03/16 0716) Weight:  [168.7 kg (372 lb)] 168.7 kg (372 lb) (03/16 0447)  General: sedated. Comfortable. NAD Neuro: Withdraws to pain but not following commands HEENT:  [place]/AT, PERRL, EOM-spontaneous and MMM PULM: Coarse BS diffusely CV:  Regular rate and rhythm.  Nl S1/S2, -M/R/G. GI: Soft, decreased BS, wound ok, distended Extremities: 1+ edema  LINES/TUBES: OETT 7.5 2/27 >>> R IJ CVL 2/27 >>> LUE DL PICC 2/26 >>> Foley >>> L NGT 2/27 >>> L RADIAL ART LINE 2/27 >>> discontinued PIV  CBC Latest Ref Rng & Units 05/06/2016 05/05/2016 05/04/2016  WBC 4.0 - 10.5 K/uL 7.9 8.7 9.4  Hemoglobin 13.0 - 17.0 g/dL 7.1(L) 7.3(L) 9.1(L)  Hematocrit 39.0 - 52.0 % 24.1(L) 25.2(L) 30.8(L)  Platelets 150 - 400 K/uL 183 210 286   BMP Latest Ref Rng & Units 05/06/2016 05/05/2016 05/04/2016  Glucose 65 - 99 mg/dL 124(H) 113(H) 155(H)  BUN 6 - 20 mg/dL 31(H) 26(H) 28(H)  Creatinine 0.61 - 1.24 mg/dL 1.21 1.15 1.48(H)  Sodium 135 - 145 mmol/L 149(H) 146(H) 147(H)  Potassium 3.5 - 5.1 mmol/L 3.7 3.5 4.6  Chloride 101 - 111 mmol/L 115(H) 116(H) 110  CO2 22 - 32 mmol/L 26 23 29   Calcium 8.9 - 10.3 mg/dL 7.7(L) 7.4(L) 9.2   Hepatic Function Latest Ref Rng & Units 05/02/2016 05/01/2016 04/28/2016  Total Protein 6.5 - 8.1 g/dL 5.7(L) 6.5 5.4(L)  Albumin 3.5 - 5.0 g/dL 1.7(L) 1.8(L) 1.5(L)  AST 15 - 41 U/L 50(H) 35 24  ALT 17 - 63 U/L 49 38 26  Alk Phosphatase 38 - 126 U/L 103 103 81  Total Bilirubin 0.3 - 1.2 mg/dL 0.5 0.7 0.5  Bilirubin, Direct <=0.2 mg/dL - - -  ASSESSMENT/PLAN:  66 y.o. male, initially admitted for reversal of ostomy, ended up with peritonitis from bowel anastomotic leak. Off pressors.  Had bowel issues post op but better.  Main issue is agitation/sedation and weaning. S/P trache on 3/13.   1. S/P Septic shock (resolved) secondary to peritonitis.  Abx, course complete.  2. Low grade fevers on 3/14.  Cultures are (-) so far.   Holding off on abx unless he clinically deteriorates. 3. Acute on chronic hypoxemic and hypercarbic respiratory failure: Trach 3/13, proceed with weaning as able. Main issue is agitation/sedation. See below.  ATC once more awake and  comfortable.  Will need diuresis as well.  4. Sedation for severe agitation. Baseline anxiety. Cont PO Klonopin and seroquel to hopefully decrease IV sedation need. Fentanyl patch started on 3/16 by Surgery.  Seroquel increased to 50 mg BID from 25 BID on 3/16 by surgery. Observe Qtc.  Pt with h/o PVCs and ?prolonged Qtc.  5. Volume overload.  Pt  Has significantly autodiuresced but still has fluid on board.  CXR still shows pulm edema.  Will start lasix  20 mg IV daily 3/16. He is on lasix and aldactone at home. May increase dose to 40 mg IV.  Keep Mg close to 2  and K close to 4.  He takes Mg and Phos daily at home. H/O prolonged QtC. Will give K replacement today. Check lytes this pm.  6. Acute on chronic stage III renal failure.  Start lasix daily on 3/16.  May increase dose. Keep Mg close to 2, K close to 4 2/2 h/o prolonged Qtc.  7. Hyperglycemia, monitor 8. History of coronary artery disease: ASA daily 9. HTN: cont PO labetalol 200 mg PO TID with holding parameters. PRN labetalol and hydralazine.     I spent  30  minutes of Critical Care time with this patient today.   Monica Becton, MD 05/06/2016, 8:33 AM [facility] Pager [phone] After 3 pm or if no answer, call [phone]

## 2016-05-06 NOTE — Plan of Care (Signed)
Problem: Activity: Goal: Ability to tolerate increased activity will improve Outcome: Not Progressing PT remains severely agitated w/ any movement and more than a half decrease in current sedation levels  Problem: Coping: Goal: Level of anxiety will decrease Outcome: Not Progressing Pt still requiring large amounts of sedation  Problem: Nutritional: Goal: Intake of prescribed amount of daily calories will improve Outcome: Progressing Tolerating TF at goal  Problem: Respiratory: Goal: Ability to maintain a clear airway and adequate ventilation will improve Outcome: Progressing w/ Frequent suctioning  Problem: Role Relationship: Goal: Method of communication will improve Outcome: Not Progressing See previous notes

## 2016-05-06 NOTE — Progress Notes (Signed)
GS Progress Note Subjective: Patient weaned yesterday for 7 hours on PS/PEEP 10/5,   Intermittently tachycardic  Objective: Vital signs in last 24 hours: Temp:  [100 F (37.8 C)-101.1 F (38.4 C)] 100 F (37.8 C) (03/16 0400) Pulse Rate:  [84-137] 131 (03/16 0716) Resp:  [9-28] 18 (03/16 0716) BP: (115-190)/(68-112) 134/92 (03/16 0716) SpO2:  [94 %-99 %] 98 % (03/16 0716) FiO2 (%):  [40 %] 40 % (03/16 0716) Weight:  [168.7 kg (372 lb)] 168.7 kg (372 lb) (03/16 0447) Last BM Date: 05/05/16  Intake/Output from previous day: 03/15 0701 - 03/16 0700 In: 4198.3 [I.V.:2728.3; NG/GT:1470] Out: 3290 [Urine:2580; Drains:10; Stool:700] Intake/Output this shift: No intake/output data recorded.  Lungs: Clear.  Abd: Distended. Ileostomy is functioning and the patient is tolerating tue feedings well.  Put out 700 cc yesterday.  Extremities: No changes  Neuro: Sedated, but gets anxious easily  Lab Results: CBC   Recent Labs  05/05/16 0418 05/06/16 0442  WBC 8.7 7.9  HGB 7.3* 7.1*  HCT 25.2* 24.1*  PLT 210 183   BMET  Recent Labs  05/05/16 0418 05/06/16 0442  NA 146* 149*  K 3.5 3.7  CL 116* 115*  CO2 23 26  GLUCOSE 113* 124*  BUN 26* 31*  CREATININE 1.15 1.21  CALCIUM 7.4* 7.7*   PT/INR  Recent Labs  05/03/16 1045  LABPROT 15.7*  INR 1.25   ABG  Recent Labs  05/04/16 0450  PHART 7.353  HCO3 29.8*    Studies/Results: Dg Chest Port 1 View  Result Date: 05/06/2016 CLINICAL DATA:  Respiratory failure EXAM: PORTABLE CHEST 1 VIEW COMPARISON:  05/04/2016 FINDINGS: Tracheostomy tube is again seen and stable. A feeding catheter and left-sided PICC line are noted. The cardiac shadow is mildly prominent but stable. Diffuse vascular congestion with interstitial edema is again identified and stable. Some increasing left retrocardiac atelectasis is noted. Small effusions are seen as well. IMPRESSION: Changes of CHF. Tubes and lines as described. Electronically  Signed   By: Inez Catalina M.D.   On: 05/06/2016 07:26   Dg Abd Portable 1v  Result Date: 05/04/2016 CLINICAL DATA:  Feeding tube placement EXAM: PORTABLE ABDOMEN - 1 VIEW COMPARISON:  04/30/2016 FINDINGS: NG tube is in place with the tip in the distal stomach. IMPRESSION: NG tube tip in the distal stomach. Electronically Signed   By: Rolm Baptise M.D.   On: 05/04/2016 17:19    Anti-infectives: Anti-infectives    Start     Dose/Rate Route Frequency Ordered Stop   04/21/16 1000  anidulafungin (ERAXIS) 100 mg in sodium chloride 0.9 % 100 mL IVPB     100 mg over 90 Minutes Intravenous Every 24 hours 04/20/16 1008 05/02/16 1115   04/20/16 1045  anidulafungin (ERAXIS) 200 mg in sodium chloride 0.9 % 200 mL IVPB     200 mg over 180 Minutes Intravenous  Once 04/20/16 1008 04/20/16 1422   04/19/16 2030  piperacillin-tazobactam (ZOSYN) IVPB 3.375 g     3.375 g 12.5 mL/hr over 240 Minutes Intravenous Every 8 hours 04/19/16 1958 05/02/16 2359   04/19/16 1230  cefoTEtan in Dextrose 5% (CEFOTAN) 2-2.08 GM-% IVPB    Comments:  Ivar Drape   : cabinet override      04/19/16 1230 04/19/16 1245   04/12/16 2000  cefoTEtan (CEFOTAN) 2 g in dextrose 5 % 50 mL IVPB     2 g 100 mL/hr over 30 Minutes Intravenous Every 12 hours 04/12/16 1120 04/12/16 2125   04/12/16 0700  cefoTEtan (CEFOTAN) 2 g in dextrose 5 % 50 mL IVPB     2 g 100 mL/hr over 30 Minutes Intravenous To ShortStay Surgical 04/11/16 1404 04/12/16 0753   04/12/16 0548  cefoTEtan in Dextrose 5% (CEFOTAN) 2-2.08 GM-% IVPB    Comments:  Precious Haws   : cabinet override      04/12/16 0548 04/12/16 1759   04/12/16 0544  neomycin (MYCIFRADIN) tablet 1,000 mg  Status:  Discontinued     1,000 mg Oral 3 times per day 04/12/16 0544 04/12/16 0547   04/12/16 0544  metroNIDAZOLE (FLAGYL) tablet 1,000 mg  Status:  Discontinued     1,000 mg Oral 3 times per day 04/12/16 0544 04/12/16 0547      Assessment/Plan: s/p Procedure(s): EXPLORATORY  LAPAROTOMY, REPAIR OF ANASTAMOTIC LEAK COLOSTOMY Increase sedation  Duragesic patch Come by later t evaluate wound and possibly remove pulled out sutures.  LOS: 24 days    Kathryne Eriksson. Dahlia Bailiff, MD, FACS [phone] [facility] 05/06/2016

## 2016-05-07 LAB — CULTURE, RESPIRATORY W GRAM STAIN

## 2016-05-07 LAB — CBC
HCT: 28.4 % — ABNORMAL LOW (ref 39.0–52.0)
Hemoglobin: 8.1 g/dL — ABNORMAL LOW (ref 13.0–17.0)
MCH: 26.8 pg (ref 26.0–34.0)
MCHC: 28.5 g/dL — AB (ref 30.0–36.0)
MCV: 94 fL (ref 78.0–100.0)
PLATELETS: 202 10*3/uL (ref 150–400)
RBC: 3.02 MIL/uL — ABNORMAL LOW (ref 4.22–5.81)
RDW: 16.5 % — AB (ref 11.5–15.5)
WBC: 8 10*3/uL (ref 4.0–10.5)

## 2016-05-07 LAB — CULTURE, RESPIRATORY: SPECIAL REQUESTS: NORMAL

## 2016-05-07 LAB — GLUCOSE, CAPILLARY
GLUCOSE-CAPILLARY: 119 mg/dL — AB (ref 65–99)
GLUCOSE-CAPILLARY: 125 mg/dL — AB (ref 65–99)
GLUCOSE-CAPILLARY: 126 mg/dL — AB (ref 65–99)
Glucose-Capillary: 126 mg/dL — ABNORMAL HIGH (ref 65–99)
Glucose-Capillary: 126 mg/dL — ABNORMAL HIGH (ref 65–99)
Glucose-Capillary: 130 mg/dL — ABNORMAL HIGH (ref 65–99)
Glucose-Capillary: 133 mg/dL — ABNORMAL HIGH (ref 65–99)

## 2016-05-07 LAB — BASIC METABOLIC PANEL
ANION GAP: 4 — AB (ref 5–15)
BUN: 33 mg/dL — ABNORMAL HIGH (ref 6–20)
CO2: 30 mmol/L (ref 22–32)
Calcium: 9 mg/dL (ref 8.9–10.3)
Chloride: 114 mmol/L — ABNORMAL HIGH (ref 101–111)
Creatinine, Ser: 1.43 mg/dL — ABNORMAL HIGH (ref 0.61–1.24)
GFR calc non Af Amer: 51 mL/min — ABNORMAL LOW (ref >60)
GFR, EST AFRICAN AMERICAN: 59 mL/min — AB (ref >60)
GLUCOSE: 146 mg/dL — AB (ref 65–99)
Potassium: 4.4 mmol/L (ref 3.5–5.1)
Sodium: 148 mmol/L — ABNORMAL HIGH (ref 135–145)

## 2016-05-07 LAB — MAGNESIUM: MAGNESIUM: 2.3 mg/dL (ref 1.7–2.4)

## 2016-05-07 LAB — PHOSPHORUS: PHOSPHORUS: 3.4 mg/dL (ref 2.5–4.6)

## 2016-05-07 MED ORDER — FUROSEMIDE 10 MG/ML IJ SOLN
40.0000 mg | Freq: Three times a day (TID) | INTRAMUSCULAR | Status: AC
  Administered 2016-05-07 (×2): 40 mg via INTRAVENOUS
  Filled 2016-05-07 (×2): qty 4

## 2016-05-07 NOTE — Progress Notes (Signed)
Flowsheet data and medications charted by Lorrin Jackson RN

## 2016-05-07 NOTE — Progress Notes (Signed)
[place] [facility] Critical [facility] Attending Note  Presenting HPI:  66 y.o. 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 Presented on 2/20 for reversal of colostomy , developed recurrent abdominal pain on 2/26 and taken back to the OR on 2/27 finding free air with peritonitis and anastomotic leakage. Patient returned from the operating room on ventilator and with ongoing shock.  IMAGING/STUDIES: TTE 2/24: LV normal in size with moderate LVH. EF 55-60% with no regional wall motion abnormalities.   MICROBIOLOGY: Blood Cultures x2 2/27 >>>ng Tracheal Aspirate Culture 2/28 >>> nml flora MRSA PCR 2/28:  Negative  Blood Cultures x2 3/2 >>> Urine Culture 3/2 >>>ng  ANTIBIOTICS: Cefotan 2/20 (periop prophylaxis) Zosyn 2/27 >>>D/C after a total of 14 days Eraxis 2/28 >>>D/C after a total of 14 days  SIGNIFICANT EVENTS: 02/20 - Admit for colostomy reversal 02/23 - Postop ileus & tachycardia 02/24 - Cardiology Consulted 02/27 - OR for Exploratory Laparotomy finding anastomotic leak & peritonitis  3/2   transitioned to Precedex infusion and  fentanyl drip, febrile  Subjective:   Agitated overnight, required more sedation, diuresed well  Vent Mode: PRVC FiO2 (%):  [40 %] 40 % Set Rate:  [14 bmp] 14 bmp Vt Set:  [580 mL] 580 mL PEEP:  [5 cmH20] 5 cmH20 Pressure Support:  [10 cmH20] 10 cmH20 Plateau Pressure:  [20 WSF68-12 cmH20] 24 cmH20  Temp:  [99 F (37.2 C)-100.5 F (38.1 C)] 99.2 F (37.3 C) (03/17 0445) Pulse Rate:  [79-150] 93 (03/17 0700) Resp:  [13-32] 20 (03/17 0700) BP: (119-212)/(66-114) 162/83 (03/17 0700) SpO2:  [95 %-100 %] 95 % (03/17 0700) FiO2 (%):  [40 %] 40 % (03/17 0452) Weight:  [168.3 kg (371 lb)] 168.3 kg (371 lb) (03/17 0500)  General: sedated. Comfortable. NAD Neuro: Withdraws to pain but not following commands HEENT: [place]/AT, PERRL, EOM-spontaneous and MMM PULM: Coarse BS diffusely CV:  Regular  rate and rhythm.  Nl S1/S2, -M/R/G. GI: Soft, decreased BS, wound ok, distended Extremities: 1+ edema  LINES/TUBES: OETT 7.5 2/27 >>> R IJ CVL 2/27 >>> LUE DL PICC 2/26 >>> Foley >>> L NGT 2/27 >>> L RADIAL ART LINE 2/27 >>> discontinued PIV  CBC Latest Ref Rng & Units 05/07/2016 05/06/2016 05/05/2016  WBC 4.0 - 10.5 K/uL 8.0 7.9 8.7  Hemoglobin 13.0 - 17.0 g/dL 8.1(L) 7.1(L) 7.3(L)  Hematocrit 39.0 - 52.0 % 28.4(L) 24.1(L) 25.2(L)  Platelets 150 - 400 K/uL 202 183 210   BMP Latest Ref Rng & Units 05/07/2016 05/06/2016 05/06/2016  Glucose 65 - 99 mg/dL 146(H) 120(H) 124(H)  BUN 6 - 20 mg/dL 33(H) 28(H) 31(H)  Creatinine 0.61 - 1.24 mg/dL 1.43(H) 1.17 1.21  Sodium 135 - 145 mmol/L 148(H) 149(H) 149(H)  Potassium 3.5 - 5.1 mmol/L 4.4 3.7 3.7  Chloride 101 - 111 mmol/L 114(H) 119(H) 115(H)  CO2 22 - 32 mmol/L _0 Calcium 8.9 - 10.3 mg/dL 9.0 7.6(L) 7.7(L)   Hepatic Function Latest Ref Rng & Units 05/02/2016 05/01/2016 04/28/2016  Total Protein 6.5 - 8.1 g/dL 5.7(L) 6.5 5.4(L)  Albumin 3.5 - 5.0 g/dL 1.7(L) 1.8(L) 1.5(L)  AST 15 - 41 U/L 50(H) 35 24  ALT 17 - 63 U/L 49 38 26  Alk Phosphatase 38 - 126 U/L 103 103 81  Total Bilirubin 0.3 - 1.2 mg/dL 0.5 0.7 0.5  Bilirubin, Direct <=0.2 mg/dL - - -   ASSESSMENT/PLAN:  66 y.o. male, initially admitted for reversal of ostomy, ended  up with peritonitis from bowel anastomotic leak. Off pressors.  Had bowel issues post op but better.  Main issue is agitation/sedation and weaning. S/P trache on 3/13.   1. S/P Septic shock (resolved) secondary to peritonitis.  Abx, course complete.  2. Low grade fevers on 3/14.  Cultures are (-) so far.   Continue to hold off abx. 3. Acute on chronic hypoxemic and hypercarbic respiratory failure: Trach 3/13, Failed weaning 3/16, will try again today pending sedation needs. 4. Sedation for severe agitation. Baseline anxiety. Cont PO Klonopin and seroquel. Fentanyl patch started on 3/16 by Surgery at 100 mcg.   Seroquel increased to 50 mg BID from 25 BID on 3/16 by surgery. Observe Qtc.  Precedex drip. 5. Volume overload.  Diuresed on 3/16, Cr bumped slightly, will decrease dose. 6. Acute on chronic stage III renal failure.  Lasix 40 mg IV q8 x2 doses ordered, d/c standing dose.  7. Hyperglycemia, monitor 8. History of coronary artery disease: ASA daily 9. HTN: cont PO labetalol 200 mg PO TID with holding parameters. PRN labetalol and hydralazine.   The patient is critically ill with multiple organ systems failure and requires high complexity decision making for assessment and support, frequent evaluation and titration of therapies, application of advanced monitoring technologies and extensive interpretation of multiple databases.   Critical Care Time devoted to patient care services described in this note is  35  Minutes. This time reflects time of care of this signee Dr  . This critical care time does not reflect procedure time, or teaching time or supervisory time of PA/NP/Med student/Med Resident etc but could involve care discussion time.   G. , M.D. [facility]. Pager: [phone]. After hours pager: [phone]. 

## 2016-05-07 NOTE — Progress Notes (Signed)
GS Progress Note Subjective: Patient will open his eyes when spoken to.  Did not follow commands.  Objective: Vital signs in last 24 hours: Temp:  [99 F (37.2 C)-100.5 F (38.1 C)] 100.2 F (37.9 C) (03/17 0800) Pulse Rate:  [79-150] 95 (03/17 0800) Resp:  [13-32] 24 (03/17 0800) BP: (119-212)/(66-114) 163/83 (03/17 0800) SpO2:  [95 %-100 %] 95 % (03/17 0800) FiO2 (%):  [40 %] 40 % (03/17 0800) Weight:  [168.3 kg (371 lb)] 168.3 kg (371 lb) (03/17 0500) Last BM Date: 05/07/16  Intake/Output from previous day: 03/16 0701 - 03/17 0700 In: 3917 [I.V.:2207; NG/GT:1710] Out: 6060 [RKYHC:6237; Drains:10; Stool:475] Intake/Output this shift: Total I/O In: 195.6 [I.V.:105.6; NG/GT:90] Out: 410 [Urine:410]  Lungs: Clear.  Weaning on the vent at 8/5.  Pulling good tidal volumes  Abd: Distended but soft.  No tenderness apparent.  Ileostomy is stil lfunctioning well.  Output yesterday only 475.  Tolerating tube feedings.  Extremities: The same  Neuro: Sedated  Lab Results: CBC   Recent Labs  05/06/16 0442 05/07/16 0403  WBC 7.9 8.0  HGB 7.1* 8.1*  HCT 24.1* 28.4*  PLT 183 202   BMET  Recent Labs  05/06/16 1631 05/07/16 0403  NA 149* 148*  K 3.7 4.4  CL 119* 114*  CO2 26 30  GLUCOSE 120* 146*  BUN 28* 33*  CREATININE 1.17 1.43*  CALCIUM 7.6* 9.0   PT/INR No results for input(s): LABPROT, INR in the last 72 hours. ABG No results for input(s): PHART, HCO3 in the last 72 hours.  Invalid input(s): PCO2, PO2  Studies/Results: Dg Chest Port 1 View  Result Date: 05/06/2016 CLINICAL DATA:  Respiratory failure EXAM: PORTABLE CHEST 1 VIEW COMPARISON:  05/04/2016 FINDINGS: Tracheostomy tube is again seen and stable. A feeding catheter and left-sided PICC line are noted. The cardiac shadow is mildly prominent but stable. Diffuse vascular congestion with interstitial edema is again identified and stable. Some increasing left retrocardiac atelectasis is noted. Small  effusions are seen as well. IMPRESSION: Changes of CHF. Tubes and lines as described. Electronically Signed   By: Inez Catalina M.D.   On: 05/06/2016 07:26   Dg Abd Portable 1v  Result Date: 05/06/2016 CLINICAL DATA:  Evaluate feeding tube placement EXAM: PORTABLE ABDOMEN - 1 VIEW COMPARISON:  05/04/2016 FINDINGS: Significantly diminished exam detail secondary to patient's body habitus. The feeding tube tip is below the level of the GE junction. The tissue appears cross the midline and is situated in the expected location of the distal stomach. IMPRESSION: 1. Exam detail diminished secondary to patient body habitus. 2. Feeding tube tip is in the projection of the expected location of the distal stomach. Electronically Signed   By: Kerby Moors M.D.   On: 05/06/2016 10:55    Anti-infectives: Anti-infectives    Start     Dose/Rate Route Frequency Ordered Stop   04/21/16 1000  anidulafungin (ERAXIS) 100 mg in sodium chloride 0.9 % 100 mL IVPB     100 mg over 90 Minutes Intravenous Every 24 hours 04/20/16 1008 05/02/16 1115   04/20/16 1045  anidulafungin (ERAXIS) 200 mg in sodium chloride 0.9 % 200 mL IVPB     200 mg over 180 Minutes Intravenous  Once 04/20/16 1008 04/20/16 1422   04/19/16 2030  piperacillin-tazobactam (ZOSYN) IVPB 3.375 g     3.375 g 12.5 mL/hr over 240 Minutes Intravenous Every 8 hours 04/19/16 1958 05/02/16 2359   04/19/16 1230  cefoTEtan in Dextrose 5% (CEFOTAN) 2-2.08 GM-%  IVPB    Comments:  Ivar Drape   : cabinet override      04/19/16 1230 04/19/16 1245   04/12/16 2000  cefoTEtan (CEFOTAN) 2 g in dextrose 5 % 50 mL IVPB     2 g 100 mL/hr over 30 Minutes Intravenous Every 12 hours 04/12/16 1120 04/12/16 2125   04/12/16 0700  cefoTEtan (CEFOTAN) 2 g in dextrose 5 % 50 mL IVPB     2 g 100 mL/hr over 30 Minutes Intravenous To ShortStay Surgical 04/11/16 1404 04/12/16 0753   04/12/16 0548  cefoTEtan in Dextrose 5% (CEFOTAN) 2-2.08 GM-% IVPB    Comments:  Precious Haws    : cabinet override      04/12/16 0548 04/12/16 1759   04/12/16 0544  neomycin (MYCIFRADIN) tablet 1,000 mg  Status:  Discontinued     1,000 mg Oral 3 times per day 04/12/16 0544 04/12/16 0547   04/12/16 0544  metroNIDAZOLE (FLAGYL) tablet 1,000 mg  Status:  Discontinued     1,000 mg Oral 3 times per day 04/12/16 0544 04/12/16 0547      Assessment/Plan: s/p Procedure(s): EXPLORATORY LAPAROTOMY, REPAIR OF ANASTAMOTIC LEAK COLOSTOMY BUN/creatinine are elevated a bit more today.  Urine output is good.  Hemoglobin is fine and WBC normal.  Hope to get to trach collar today.  Wound is still open and separated without evisceration.  LOS: 25 days    Kathryne Eriksson. Dahlia Bailiff, MD, FACS [phone] [facility] Surgery 05/07/2016

## 2016-05-08 ENCOUNTER — Inpatient Hospital Stay (HOSPITAL_COMMUNITY): Payer: Medicare Other

## 2016-05-08 LAB — CBC
HCT: 25.6 % — ABNORMAL LOW (ref 39.0–52.0)
HEMOGLOBIN: 7.4 g/dL — AB (ref 13.0–17.0)
MCH: 27.1 pg (ref 26.0–34.0)
MCHC: 28.9 g/dL — AB (ref 30.0–36.0)
MCV: 93.8 fL (ref 78.0–100.0)
PLATELETS: 177 10*3/uL (ref 150–400)
RBC: 2.73 MIL/uL — AB (ref 4.22–5.81)
RDW: 16.5 % — ABNORMAL HIGH (ref 11.5–15.5)
WBC: 6.7 10*3/uL (ref 4.0–10.5)

## 2016-05-08 LAB — BLOOD GAS, ARTERIAL
Acid-Base Excess: 7.6 mmol/L — ABNORMAL HIGH (ref 0.0–2.0)
Bicarbonate: 33 mmol/L — ABNORMAL HIGH (ref 20.0–28.0)
DRAWN BY: 252031
FIO2: 45
O2 Saturation: 93.3 %
PEEP: 5 cmH2O
Patient temperature: 98.6
RATE: 14 resp/min
VT: 580 mL
pCO2 arterial: 60 mmHg — ABNORMAL HIGH (ref 32.0–48.0)
pH, Arterial: 7.359 (ref 7.350–7.450)
pO2, Arterial: 72.7 mmHg — ABNORMAL LOW (ref 83.0–108.0)

## 2016-05-08 LAB — BASIC METABOLIC PANEL
ANION GAP: 6 (ref 5–15)
BUN: 28 mg/dL — ABNORMAL HIGH (ref 6–20)
CALCIUM: 8 mg/dL — AB (ref 8.9–10.3)
CO2: 29 mmol/L (ref 22–32)
Chloride: 115 mmol/L — ABNORMAL HIGH (ref 101–111)
Creatinine, Ser: 1.21 mg/dL (ref 0.61–1.24)
Glucose, Bld: 124 mg/dL — ABNORMAL HIGH (ref 65–99)
Potassium: 3.5 mmol/L (ref 3.5–5.1)
SODIUM: 150 mmol/L — AB (ref 135–145)

## 2016-05-08 LAB — GLUCOSE, CAPILLARY
GLUCOSE-CAPILLARY: 109 mg/dL — AB (ref 65–99)
GLUCOSE-CAPILLARY: 120 mg/dL — AB (ref 65–99)
GLUCOSE-CAPILLARY: 124 mg/dL — AB (ref 65–99)
Glucose-Capillary: 129 mg/dL — ABNORMAL HIGH (ref 65–99)
Glucose-Capillary: 143 mg/dL — ABNORMAL HIGH (ref 65–99)
Glucose-Capillary: 125 mg/dL — ABNORMAL HIGH (ref 65–99)

## 2016-05-08 LAB — MAGNESIUM: MAGNESIUM: 1.8 mg/dL (ref 1.7–2.4)

## 2016-05-08 LAB — PHOSPHORUS: PHOSPHORUS: 2.6 mg/dL (ref 2.5–4.6)

## 2016-05-08 MED ORDER — CHLORHEXIDINE GLUCONATE 0.12% ORAL RINSE (MEDLINE KIT)
15.0000 mL | Freq: Two times a day (BID) | OROMUCOSAL | Status: DC
  Administered 2016-05-08 – 2016-05-13 (×10): 15 mL via OROMUCOSAL

## 2016-05-08 MED ORDER — ORAL CARE MOUTH RINSE
15.0000 mL | Freq: Four times a day (QID) | OROMUCOSAL | Status: DC
  Administered 2016-05-09 – 2016-05-13 (×20): 15 mL via OROMUCOSAL

## 2016-05-08 MED ORDER — POTASSIUM CHLORIDE 20 MEQ/15ML (10%) PO SOLN
40.0000 meq | Freq: Three times a day (TID) | ORAL | Status: AC
  Administered 2016-05-08 (×2): 40 meq
  Filled 2016-05-08 (×2): qty 30

## 2016-05-08 MED ORDER — MAGNESIUM SULFATE 2 GM/50ML IV SOLN
2.0000 g | Freq: Once | INTRAVENOUS | Status: AC
  Administered 2016-05-08: 2 g via INTRAVENOUS
  Filled 2016-05-08: qty 50

## 2016-05-08 MED ORDER — POTASSIUM PHOSPHATES 15 MMOLE/5ML IV SOLN
30.0000 mmol | Freq: Once | INTRAVENOUS | Status: AC
  Administered 2016-05-08: 30 mmol via INTRAVENOUS
  Filled 2016-05-08: qty 10

## 2016-05-08 MED ORDER — FREE WATER
200.0000 mL | Freq: Three times a day (TID) | Status: DC
  Administered 2016-05-08 – 2016-05-09 (×4): 200 mL

## 2016-05-08 MED ORDER — FUROSEMIDE 10 MG/ML IJ SOLN
40.0000 mg | Freq: Four times a day (QID) | INTRAMUSCULAR | Status: AC
  Administered 2016-05-08 (×3): 40 mg via INTRAVENOUS
  Filled 2016-05-08 (×3): qty 4

## 2016-05-08 NOTE — Progress Notes (Signed)
[name] Progress Note Subjective: Patient will awaken and seems to understand me.  No distress, but FIO2 up to 45% this AM.  Sats are okay  Objective: Vital signs in last 24 hours: Temp:  [99.3 F (37.4 C)-100.2 F (37.9 C)] 99.3 F (37.4 C) (03/18 0321) Pulse Rate:  [79-135] 83 (03/18 0700) Resp:  [0-32] 18 (03/18 0700) BP: (112-184)/(63-158) 126/79 (03/18 0700) SpO2:  [89 %-98 %] 96 % (03/18 0700) FiO2 (%):  [40 %-50 %] 45 % (03/18 0350) Weight:  [168.4 kg (371 lb 4.1 oz)] 168.4 kg (371 lb 4.1 oz) (03/18 0430) Last BM Date: 05/07/16  Intake/Output from previous day: 03/17 0701 - 03/18 0700 In: 3919.4 [I.V.:2149.4; NG/GT:1770] Out: 8305 [Urine:7350; Drains:5; Stool:950] Intake/Output this shift: No intake/output data recorded.  Lungs: Clear  Abd: softer.  Ileosotmy outptu 950cc yesterday.  Extremities: No changes  Neuro: Intact  Electolytes:  Na+ up to 150  Lab Results: CBC   Recent Labs  05/07/16 0403 05/08/16 0446  WBC 8.0 6.7  HGB 8.1* 7.4*  HCT 28.4* 25.6*  PLT 202 177   BMET  Recent Labs  05/07/16 0403 05/08/16 0446  NA 148* 150*  K 4.4 3.5  CL 114* 115*  CO2 30 29  GLUCOSE 146* 124*  BUN 33* 28*  CREATININE 1.43* 1.21  CALCIUM 9.0 8.0*   PT/INR No results for input(s): LABPROT, INR in the last 72 hours. ABG  Recent Labs  05/08/16 0335  PHART 7.359  HCO3 33.0*    Studies/Results: Dg Abd Portable 1v  Result Date: 05/06/2016 CLINICAL DATA:  Evaluate feeding tube placement EXAM: PORTABLE ABDOMEN - 1 VIEW COMPARISON:  05/04/2016 FINDINGS: Significantly diminished exam detail secondary to patient's body habitus. The feeding tube tip is below the level of the GE junction. The tissue appears cross the midline and is situated in the expected location of the distal stomach. IMPRESSION: 1. Exam detail diminished secondary to patient body habitus. 2. Feeding tube tip is in the projection of the expected location of the distal stomach. Electronically  Signed   By: Kerby Moors M.D.   On: 05/06/2016 10:55    Anti-infectives: Anti-infectives    Start     Dose/Rate Route Frequency Ordered Stop   04/21/16 1000  anidulafungin (ERAXIS) 100 mg in sodium chloride 0.9 % 100 mL IVPB     100 mg over 90 Minutes Intravenous Every 24 hours 04/20/16 1008 05/02/16 1115   04/20/16 1045  anidulafungin (ERAXIS) 200 mg in sodium chloride 0.9 % 200 mL IVPB     200 mg over 180 Minutes Intravenous  Once 04/20/16 1008 04/20/16 1422   04/19/16 2030  piperacillin-tazobactam (ZOSYN) IVPB 3.375 g     3.375 g 12.5 mL/hr over 240 Minutes Intravenous Every 8 hours 04/19/16 1958 05/02/16 2359   04/19/16 1230  cefoTEtan in Dextrose 5% (CEFOTAN) 2-2.08 GM-% IVPB    Comments:  Ivar Drape   : cabinet override      04/19/16 1230 04/19/16 1245   04/12/16 2000  cefoTEtan (CEFOTAN) 2 g in dextrose 5 % 50 mL IVPB     2 g 100 mL/hr over 30 Minutes Intravenous Every 12 hours 04/12/16 1120 04/12/16 2125   04/12/16 0700  cefoTEtan (CEFOTAN) 2 g in dextrose 5 % 50 mL IVPB     2 g 100 mL/hr over 30 Minutes Intravenous To ShortStay Surgical 04/11/16 1404 04/12/16 0753   04/12/16 0548  cefoTEtan in Dextrose 5% (CEFOTAN) 2-2.08 GM-% IVPB    Comments:  Laurance Flatten, Kristi   : cabinet override      04/12/16 0548 04/12/16 1759   04/12/16 0544  neomycin (MYCIFRADIN) tablet 1,000 mg  Status:  Discontinued     1,000 mg Oral 3 times per day 04/12/16 0544 04/12/16 0547   04/12/16 0544  metroNIDAZOLE (FLAGYL) tablet 1,000 mg  Status:  Discontinued     1,000 mg Oral 3 times per day 04/12/16 0544 04/12/16 0547      Assessment/Plan: s/p Procedure(s): EXPLORATORY LAPAROTOMY, REPAIR OF ANASTAMOTIC LEAK COLOSTOMY Add free water to tube feedings  LOS: 26 days    Kathryne Eriksson. Dahlia Bailiff, MD, FACS [phone] [facility] 05/08/2016

## 2016-05-08 NOTE — Progress Notes (Signed)
[place] [facility] Attending Note  Presenting HPI:  66 y.o. 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 Presented on 2/20 for reversal of colostomy , developed recurrent abdominal pain on 2/26 and taken back to the OR on 2/27 finding free air with peritonitis and anastomotic leakage. Patient returned from the operating room on ventilator and with ongoing shock.  IMAGING/STUDIES: TTE 2/24: LV normal in size with moderate LVH. EF 55-60% with no regional wall motion abnormalities.   MICROBIOLOGY: Blood Cultures x2 2/27 >>>ng Tracheal Aspirate Culture 2/28 >>> nml flora MRSA PCR 2/28:  Negative  Blood Cultures x2 3/2 >>> Urine Culture 3/2 >>>ng  ANTIBIOTICS: Cefotan 2/20 (periop prophylaxis) Zosyn 2/27 >>>D/C after a total of 14 days Eraxis 2/28 >>>D/C after a total of 14 days  SIGNIFICANT EVENTS: 02/20 - Admit for colostomy reversal 02/23 - Postop ileus & tachycardia 02/24 - Cardiology Consulted 02/27 - OR for Exploratory Laparotomy finding anastomotic leak & peritonitis  3/2   transitioned to Precedex infusion and  fentanyl drip, febrile  Subjective:   Weaning on 12/5 yesterday for 2 hours then became tachypneic, no further events overnight  Vent Mode: PSV;CPAP FiO2 (%):  [40 %-50 %] 40 % Set Rate:  [14 bmp] 14 bmp Vt Set:  [580 mL] 580 mL PEEP:  [5 cmH20] 5 cmH20 Pressure Support:  [12 cmH20] 12 cmH20 Plateau Pressure:  [19 cmH20-24 cmH20] 20 cmH20  Temp:  [99.3 F (37.4 C)-100.7 F (38.2 C)] 100.7 F (38.2 C) (03/18 0753) Pulse Rate:  [79-135] 94 (03/18 1000) Resp:  [0-32] 20 (03/18 1000) BP: (112-184)/(69-158) 182/101 (03/18 1000) SpO2:  [89 %-98 %] 92 % (03/18 1000) FiO2 (%):  [40 %-50 %] 40 % (03/18 0813) Weight:  [168.4 kg (371 lb 4.1 oz)] 168.4 kg (371 lb 4.1 oz) (03/18 0430)  General: Arousable . Comfortable. NAD Neuro: Withdraws to pain but not following commands HEENT: [place]/AT, PERRL,  EOM-spontaneous and MMM PULM: Coarse BS diffusely CV:  Regular rate and rhythm.  Nl S1/S2, -M/R/G. GI: Soft, decreased BS, wound ok, distended Extremities: 1+ edema  LINES/TUBES: OETT 7.5 2/27 >>> R IJ CVL 2/27 >>> LUE DL PICC 2/26 >>> Foley >>> L NGT 2/27 >>> L RADIAL ART LINE 2/27 >>> discontinued PIV  CBC Latest Ref Rng & Units 05/08/2016 05/07/2016 05/06/2016  WBC 4.0 - 10.5 K/uL 6.7 8.0 7.9  Hemoglobin 13.0 - 17.0 g/dL 7.4(L) 8.1(L) 7.1(L)  Hematocrit 39.0 - 52.0 % 25.6(L) 28.4(L) 24.1(L)  Platelets 150 - 400 K/uL 177 202 183   BMP Latest Ref Rng & Units 05/08/2016 05/07/2016 05/06/2016  Glucose 65 - 99 mg/dL 124(H) 146(H) 120(H)  BUN 6 - 20 mg/dL 28(H) 33(H) 28(H)  Creatinine 0.61 - 1.24 mg/dL 1.21 1.43(H) 1.17  Sodium 135 - 145 mmol/L 150(H) 148(H) 149(H)  Potassium 3.5 - 5.1 mmol/L 3.5 4.4 3.7  Chloride 101 - 111 mmol/L 115(H) 114(H) 119(H)  CO2 22 - 32 mmol/L _0 Calcium 8.9 - 10.3 mg/dL 8.0(L) 9.0 7.6(L)   Hepatic Function Latest Ref Rng & Units 05/02/2016 05/01/2016 04/28/2016  Total Protein 6.5 - 8.1 g/dL 5.7(L) 6.5 5.4(L)  Albumin 3.5 - 5.0 g/dL 1.7(L) 1.8(L) 1.5(L)  AST 15 - 41 U/L 50(H) 35 24  ALT 17 - 63 U/L 49 38 26  Alk Phosphatase 38 - 126 U/L 103 103 81  Total Bilirubin 0.3 - 1.2 mg/dL 0.5 0.7 0.5  Bilirubin, Direct <=0.2 mg/dL - - -  ASSESSMENT/PLAN:  66 y.o. male, initially admitted for reversal of ostomy, ended up with peritonitis from bowel anastomotic leak. Off pressors.  Had bowel issues post op but better.  Main issue is agitation/sedation and weaning. S/P trache on 3/13.   1. S/P Septic shock (resolved) secondary to peritonitis.  Abx, course complete.  2. Low grade fevers on 3/14.  Cultures are (-) so far.   Continue to hold off abx. 3. Acute on chronic hypoxemic and hypercarbic respiratory failure: Trach 3/13, failed weaning 3/16, lasted for 2 hours 3/17, will push for 6-8 hours today. 4. Sedation for severe agitation. Baseline anxiety. Cont PO  Klonopin and seroquel. Fentanyl patch started on 3/16 at 100 mcg.  Seroquel increased to 50 mg BID from 25 BID on 3/16 by surgery. Observe Qtc.  Precedex drip (currently 1.7). 5. Continue active diureses today with K, Mg and Phos replacements 6. Acute on chronic stage III renal failure.  Lasix 40 mg IV q6 x3 doses ordered, d/c standing dose.  7. Hyperglycemia, monitor 8. History of coronary artery disease: ASA daily 9. HTN: cont PO labetalol 200 mg PO TID with holding parameters. PRN labetalol and hydralazine.   The patient is critically ill with multiple organ systems failure and requires high complexity decision making for assessment and support, frequent evaluation and titration of therapies, application of advanced monitoring technologies and extensive interpretation of multiple databases.   Critical Care Time devoted to patient care services described in this note is  35  Minutes. This time reflects time of care of this signee Dr Jennet Maduro. This critical care time does not reflect procedure time, or teaching time or supervisory time of PA/NP/Med student/Med Resident etc but could involve care discussion time.  Rush Farmer, M.D. [facility]/Critical Care Medicine. Pager: [phone]. After hours pager: [phone].

## 2016-05-09 LAB — CULTURE, BLOOD (ROUTINE X 2)
CULTURE: NO GROWTH
CULTURE: NO GROWTH

## 2016-05-09 LAB — GLUCOSE, CAPILLARY
GLUCOSE-CAPILLARY: 119 mg/dL — AB (ref 65–99)
GLUCOSE-CAPILLARY: 141 mg/dL — AB (ref 65–99)
Glucose-Capillary: 115 mg/dL — ABNORMAL HIGH (ref 65–99)
Glucose-Capillary: 122 mg/dL — ABNORMAL HIGH (ref 65–99)
Glucose-Capillary: 123 mg/dL — ABNORMAL HIGH (ref 65–99)
Glucose-Capillary: 129 mg/dL — ABNORMAL HIGH (ref 65–99)

## 2016-05-09 LAB — BASIC METABOLIC PANEL
ANION GAP: 10 (ref 5–15)
ANION GAP: 9 (ref 5–15)
BUN: 30 mg/dL — AB (ref 6–20)
BUN: 33 mg/dL — ABNORMAL HIGH (ref 6–20)
CO2: 30 mmol/L (ref 22–32)
CO2: 32 mmol/L (ref 22–32)
Calcium: 8.1 mg/dL — ABNORMAL LOW (ref 8.9–10.3)
Calcium: 9.2 mg/dL (ref 8.9–10.3)
Chloride: 109 mmol/L (ref 101–111)
Chloride: 109 mmol/L (ref 101–111)
Creatinine, Ser: 1.25 mg/dL — ABNORMAL HIGH (ref 0.61–1.24)
Creatinine, Ser: 1.5 mg/dL — ABNORMAL HIGH (ref 0.61–1.24)
GFR calc Af Amer: >60 mL/min (ref >60)
GFR, EST AFRICAN AMERICAN: 55 mL/min — AB (ref >60)
GFR, EST NON AFRICAN AMERICAN: 60 mL/min — AB (ref >60)
GFR, EST NON AFRICAN AMERICAN: 48 mL/min — AB (ref >60)
Glucose, Bld: 121 mg/dL — ABNORMAL HIGH (ref 65–99)
Glucose, Bld: 125 mg/dL — ABNORMAL HIGH (ref 65–99)
POTASSIUM: 3 mmol/L — AB (ref 3.5–5.1)
POTASSIUM: 3.7 mmol/L (ref 3.5–5.1)
SODIUM: 149 mmol/L — AB (ref 135–145)
SODIUM: 150 mmol/L — AB (ref 135–145)

## 2016-05-09 LAB — CBC
HEMATOCRIT: 27 % — AB (ref 39.0–52.0)
HEMOGLOBIN: 7.9 g/dL — AB (ref 13.0–17.0)
MCH: 27.2 pg (ref 26.0–34.0)
MCHC: 29.3 g/dL — ABNORMAL LOW (ref 30.0–36.0)
MCV: 93.1 fL (ref 78.0–100.0)
PLATELETS: 214 10*3/uL (ref 150–400)
RBC: 2.9 MIL/uL — ABNORMAL LOW (ref 4.22–5.81)
RDW: 16.3 % — ABNORMAL HIGH (ref 11.5–15.5)
WBC: 8.7 10*3/uL (ref 4.0–10.5)

## 2016-05-09 LAB — PHOSPHORUS: Phosphorus: 2.6 mg/dL (ref 2.5–4.6)

## 2016-05-09 LAB — MAGNESIUM: MAGNESIUM: 1.9 mg/dL (ref 1.7–2.4)

## 2016-05-09 MED ORDER — POTASSIUM CHLORIDE 20 MEQ/15ML (10%) PO SOLN
40.0000 meq | Freq: Once | ORAL | Status: AC
  Administered 2016-05-09: 40 meq
  Filled 2016-05-09: qty 30

## 2016-05-09 MED ORDER — MAGNESIUM SULFATE 2 GM/50ML IV SOLN
2.0000 g | Freq: Once | INTRAVENOUS | Status: AC
  Administered 2016-05-09: 2 g via INTRAVENOUS
  Filled 2016-05-09: qty 50

## 2016-05-09 MED ORDER — FUROSEMIDE 10 MG/ML IJ SOLN
40.0000 mg | Freq: Two times a day (BID) | INTRAMUSCULAR | Status: DC
  Administered 2016-05-09 – 2016-05-10 (×2): 40 mg via INTRAVENOUS
  Filled 2016-05-09 (×2): qty 4

## 2016-05-09 MED ORDER — DEXTROSE 5 % IV SOLN
INTRAVENOUS | Status: DC
  Administered 2016-05-09 – 2016-05-13 (×8): via INTRAVENOUS

## 2016-05-09 MED ORDER — POTASSIUM CHLORIDE 20 MEQ/15ML (10%) PO SOLN
60.0000 meq | Freq: Once | ORAL | Status: AC
  Administered 2016-05-09: 60 meq
  Filled 2016-05-09: qty 45

## 2016-05-09 NOTE — Progress Notes (Addendum)
Progress Note  Presenting HPI:  66 y.o. 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 Presented on 2/20 for reversal of colostomy , developed recurrent abdominal pain on 2/26 and taken back to the OR on 2/27 finding free air with peritonitis and anastomotic leakage. Patient returned from the operating room on ventilator and with ongoing shock.  IMAGING/STUDIES: TTE 2/24: LV normal in size with moderate LVH. EF 55-60% with no regional wall motion abnormalities.   MICROBIOLOGY: Blood Cultures x2 2/27 >>>ng Tracheal Aspirate Culture 2/28 >>> nml flora MRSA PCR 2/28:  Negative  Blood Cultures x2 3/2 >>>ng Urine Culture 3/2 >>>ng  ANTIBIOTICS: Cefotan 2/20 (periop prophylaxis) Zosyn 2/27 >>>D/C after a total of 14 days Eraxis 2/28 >>>D/C after a total of 14 days  SIGNIFICANT EVENTS: 02/20 - Admit for colostomy reversal 02/23 - Postop ileus & tachycardia 02/24 - Cardiology Consulted 02/27 - OR for Exploratory Laparotomy finding anastomotic leak & peritonitis  3/2   transitioned to Precedex infusion and  fentanyl drip, febrile  Subjective:   Weaning on 12/5 today 3/19 . Pt is  calm and is following commands. Sedation : precedex weaned  to 0.5/ mcg/kg/hr., and Fentanyl to 125/ hr  Vent Mode: PSV;CPAP FiO2 (%):  [40 %] 40 % Set Rate:  [14 bmp] 14 bmp Vt Set:  [580 mL] 580 mL PEEP:  [5 cmH20] 5 cmH20 Pressure Support:  [12 cmH20] 12 cmH20 Plateau Pressure:  [21 cmH20-22 cmH20] 22 cmH20  Temp:  [99 F (37.2 C)-100.8 F (38.2 C)] 99 F (37.2 C) (03/19 0700) Pulse Rate:  [73-142] 104 (03/19 0825) Resp:  [16-28] 26 (03/19 0825) BP: (106-188)/(65-110) 163/83 (03/19 0825) SpO2:  [92 %-96 %] 95 % (03/19 0825) FiO2 (%):  [40 %] 40 % (03/19 0825) Weight:  [350 lb (158.8 kg)] 350 lb (158.8 kg) (03/19 0500)  General: Awake and alert, following commands, calm, in NAD Neuro: Awake and following commands, MAE x 4, nods to simple  questions HEENT: [place]/A, alertT, PERRL, EOM-spontaneous and MMM PULM:Coarse BS throughout, diminished per bases, minimal secretions CV:  RRR, no rub, murmur or gallop, S1, S2 GI: Soft, tender to palpation, BS diminished but present, stoma pink, good output,  Extremities: Warm and dry, trace edema bilaterally, brisk refill.  LINES/TUBES: OETT 7.5 2/27 >>> R IJ CVL 2/27 >>>  LUE DL PICC 2/26 >>> Foley >>> L NGT 2/27 >>> L RADIAL ART LINE 2/27 >>> discontinued PIV  CBC Latest Ref Rng & Units 05/09/2016 05/08/2016 05/07/2016  WBC 4.0 - 10.5 K/uL 8.7 6.7 8.0  Hemoglobin 13.0 - 17.0 g/dL 7.9(L) 7.4(L) 8.1(L)  Hematocrit 39.0 - 52.0 % 27.0(L) 25.6(L) 28.4(L)  Platelets 150 - 400 K/uL 214 177 202   BMP Latest Ref Rng & Units 05/09/2016 05/08/2016 05/07/2016  Glucose 65 - 99 mg/dL 121(H) 124(H) 146(H)  BUN 6 - 20 mg/dL 30(H) 28(H) 33(H)  Creatinine 0.61 - 1.24 mg/dL 1.25(H) 1.21 1.43(H)  Sodium 135 - 145 mmol/L 149(H) 150(H) 148(H)  Potassium 3.5 - 5.1 mmol/L 3.0(L) 3.5 4.4  Chloride 101 - 111 mmol/L 109 115(H) 114(H)  CO2 22 - 32 mmol/L 30 29 30   Calcium 8.9 - 10.3 mg/dL 8.1(L) 8.0(L) 9.0   Hepatic Function Latest Ref Rng & Units 05/02/2016 05/01/2016 04/28/2016  Total Protein 6.5 - 8.1 g/dL 5.7(L) 6.5 5.4(L)  Albumin 3.5 - 5.0 g/dL 1.7(L) 1.8(L) 1.5(L)  AST 15 - 41 U/L 50(H) 35 24  ALT 17 - 63 U/L 49 38  26  Alk Phosphatase 38 - 126 U/L 103 103 81  Total Bilirubin 0.3 - 1.2 mg/dL 0.5 0.7 0.5  Bilirubin, Direct <=0.2 mg/dL - - -   ASSESSMENT/PLAN:  66 y.o. male, initially admitted for reversal of ostomy, ended up with peritonitis from bowel anastomotic leak. Off pressors.  Had bowel issues post op but better.  Main issue is agitation/sedation and weaning. S/P trache on 3/13.   1. S/P Septic shock (resolved) secondary to peritonitis.  ABX completed, low grade fever, hemodynamically stable, WBC WNL 2. Continued Low grade fevers on 3/18 and 3/19.  Tracheal Aspirate 3/14 + for pseudamonas.    Consider treatment vs surveillance.WBC WNL 3. Acute on chronic hypoxemic and hypercarbic respiratory failure: Trach 3/13, failed weaning 3/16, lasted for 2 hours 3/17, looks good 3/19, goal is 6-8 hours today. 4. Sedation for severe agitation. Baseline anxiety. Cont PO Klonopin and seroquel. Fentanyl patch started on 3/16 at 100 mcg.  Seroquel increased to 50 mg BID from 25 BID on 3/16 by surgery. Observe Qtc.  Precedex drip weaned to 0.5 and fentanyl weaned to 125. Calm and alert. 5. Mag and K replaced 3/19. Close QTc monitoring. 0.47 3/19. Keep K 4 and Mag 2.             Hypernatremia: Continue free water 200 every 8 hours. 6. Acute on chronic stage III renal failure.  Lasix 40 mg IV q6 x3 doses 3/18, Net negative 4 L last 24. CVP is 3 this am.CXR 3/20 7. Hyperglycemia, monitor, SSI as indicated. 8. History of coronary artery disease: ASA daily, Telemetry 9. HTN: cont PO labetalol 200 mg PO TID with holding parameters. PRN labetalol and hydralazine.   Weaning well this am. Alert and Calm. Repletion of potassium and Mag 3/19, will check BMET at noon and reassess additional repletion. Goal is to wean 6-8 hours today, and to continue weaning sedation as able. CVP is 3, creatinine 1.25,may add gentle lasix 1 time dose.  No family at bedside 3/19 am.   Magdalen Spatz, AGACNP-BC [place] After hours pager: [phone].  STAFF NOTE: I, Merrie Roof, MD FACP have personally reviewed patient's available data, including medical history, events of note, physical examination and test results as part of my evaluation. I have discussed with resident/NP and other care providers such as pharmacist, RN and RRT. In addition, I personally evaluated patient and elicited key findings of: awakens, fc, lungs coarse, abdo soft, BS present, tolerating TF. pcxr last still revealed edema, fluid fissure cvp noted low, edema on ext remains but lower, would not need 5 liters neg today, would  have goal neg 1 liter, reduce lasix, continued to correct Na with d5w at low rate and dc free water, bmet follow up needed, weaning ps goal to 8 hours, would push for trach collar possibly in am pending success, k supp, pcxr in am, tube feeds to goal, dc cvp assessments, likely will be lowering lasix daily clincally, goal is to dc precedex, if unable to dc this will add oral agents, mild temp, pseumonas noted, but secretions status good, weaning well, clinically NOT with VAP, will monitor without treatment as likely colonizer for now The patient is critically ill with multiple organ systems failure and requires high complexity decision making for assessment and support, frequent evaluation and titration of therapies, application of advanced monitoring technologies and extensive interpretation of multiple databases.   Critical Care Time devoted to patient care services described in this note is 30  Minutes. This time reflects time of care of this signee: Merrie Roof, MD FACP. This critical care time does not reflect procedure time, or teaching time or supervisory time of PA/NP/Med student/Med Resident etc but could involve care discussion time. Rest per NP/medical resident whose note is outlined above and that I agree with   Lavon Paganini. Titus Mould, MD, [place] Pgr: [place] [facility] 05/09/2016 10:13 AM

## 2016-05-09 NOTE — Progress Notes (Signed)
[place] Progress Note Patient Name: Larry [name] DOB: 09-12-1950 MRN: [id number]   Date of Service  05/09/2016  HPI/Events of Note  Low K. Jennette  eICU Interventions  Replace K     Intervention Category Intermediate Interventions: Electrolyte abnormality - evaluation and management  [place] 05/09/2016, 8:20 PM

## 2016-05-09 NOTE — Progress Notes (Signed)
[facility] Progress Note Subjective: Patient is a lot less agitated this AM than previously.  Weaning on the ventilator.    Objective: Vital signs in last 24 hours: Temp:  [99 F (37.2 C)-100.8 F (38.2 C)] 99 F (37.2 C) (03/19 0700) Pulse Rate:  [73-142] 104 (03/19 0825) Resp:  [16-27] 26 (03/19 0825) BP: (106-188)/(65-110) 163/83 (03/19 0825) SpO2:  [92 %-96 %] 95 % (03/19 0825) FiO2 (%):  [40 %] 40 % (03/19 0825) Weight:  [158.8 kg (350 lb)] 158.8 kg (350 lb) (03/19 0500) Last BM Date: 05/08/16  Intake/Output from previous day: 03/18 0701 - 03/19 0700 In: 4313.2 [I.V.:1513.2; NG/GT:2190; IV Piggyback:610] Out: 8460 [Urine:6890; Drains:10; Stool:1560] Intake/Output this shift: Total I/O In: 222.6 [I.V.:102.6; NG/GT:120] Out: 350 [Emesis/NG output:350]  Lungs: No distress this AM.  Abd: Softer, ileostomy is functioning fine.  > 1500 cc output yesterday.    Extremities: No changes  Neuro: More appropriately awake this AM  Lab Results: CBC   Recent Labs  05/08/16 0446 05/09/16 0414  WBC 6.7 8.7  HGB 7.4* 7.9*  HCT 25.6* 27.0*  PLT 177 214   BMET  Recent Labs  05/08/16 0446 05/09/16 0414  NA 150* 149*  K 3.5 3.0*  CL 115* 109  CO2 29 30  GLUCOSE 124* 121*  BUN 28* 30*  CREATININE 1.21 1.25*  CALCIUM 8.0* 8.1*   PT/INR No results for input(s): LABPROT, INR in the last 72 hours. ABG  Recent Labs  05/08/16 0335  PHART 7.359  HCO3 33.0*    Studies/Results: Dg Chest Port 1 View  Result Date: 05/08/2016 CLINICAL DATA:  Intubated. EXAM: PORTABLE CHEST 1 VIEW COMPARISON:  05/06/2016. FINDINGS: Tracheostomy tube in satisfactory position. Feeding tube extending into the stomach. Stable enlarged cardiac silhouette and prominent pulmonary vasculature and interstitial markings. Increased right basilar opacity and stable left basilar opacity. Thoracic spine degenerative changes. IMPRESSION: 1. Increased pneumonia or alveolar edema and pleural fluid at the right  lung base. 2. Stable left basilar atelectasis or pneumonia and pleural effusion. 3. Stable cardiomegaly and changes of congestive heart failure. Electronically Signed   By: Claudie Revering M.D.   On: 05/08/2016 07:47    Anti-infectives: Anti-infectives    Start     Dose/Rate Route Frequency Ordered Stop   04/21/16 1000  anidulafungin (ERAXIS) 100 mg in sodium chloride 0.9 % 100 mL IVPB     100 mg over 90 Minutes Intravenous Every 24 hours 04/20/16 1008 05/02/16 1115   04/20/16 1045  anidulafungin (ERAXIS) 200 mg in sodium chloride 0.9 % 200 mL IVPB     200 mg over 180 Minutes Intravenous  Once 04/20/16 1008 04/20/16 1422   04/19/16 2030  piperacillin-tazobactam (ZOSYN) IVPB 3.375 g     3.375 g 12.5 mL/hr over 240 Minutes Intravenous Every 8 hours 04/19/16 1958 05/02/16 2359   04/19/16 1230  cefoTEtan in Dextrose 5% (CEFOTAN) 2-2.08 GM-% IVPB    Comments:  Ivar Drape   : cabinet override      04/19/16 1230 04/19/16 1245   04/12/16 2000  cefoTEtan (CEFOTAN) 2 g in dextrose 5 % 50 mL IVPB     2 g 100 mL/hr over 30 Minutes Intravenous Every 12 hours 04/12/16 1120 04/12/16 2125   04/12/16 0700  cefoTEtan (CEFOTAN) 2 g in dextrose 5 % 50 mL IVPB     2 g 100 mL/hr over 30 Minutes Intravenous To [facility] 04/11/16 1404 04/12/16 0753   04/12/16 0548  cefoTEtan in Dextrose 5% (CEFOTAN) 2-2.08  GM-% IVPB    Comments:  Precious Haws   : cabinet override      04/12/16 0548 04/12/16 1759   04/12/16 0544  neomycin (MYCIFRADIN) tablet 1,000 mg  Status:  Discontinued     1,000 mg Oral 3 times per day 04/12/16 0544 04/12/16 0547   04/12/16 0544  metroNIDAZOLE (FLAGYL) tablet 1,000 mg  Status:  Discontinued     1,000 mg Oral 3 times per day 04/12/16 0544 04/12/16 0547      Assessment/Plan: s/p Procedure(s): EXPLORATORY LAPAROTOMY, REPAIR OF ANASTAMOTIC LEAK COLOSTOMY Need to watch ileostomy output and compensate if gets to be too high.  LOS: 27 days    Kathryne Eriksson. Dahlia Bailiff, MD,  FACS [phone] [facility] 05/09/2016

## 2016-05-09 NOTE — Care Management Note (Signed)
Case Management Note  Patient Details  Name: Larry [name] MRN: [id number] Date of Birth: 15-Jan-1951  Subjective/Objective:   s/p exloratory lap repair of anastomotic leak , colostomy, conts on vent wean with precedex, and fentanyl, cvp is 3.  NCM will cont to follow for dc needs.                 Action/Plan:   Expected Discharge Date:                  Expected Discharge Plan:  [place]  In-House Referral:     Discharge planning Services  CM Consult  Post Acute Care Choice:  Home Health Choice offered to:     DME Arranged:    DME Agency:  [place] Arranged:  PT [facility] Agency:     Status of Service:  In process, will continue to follow  If discussed at Long Length of Stay Meetings, dates discussed:    Additional Comments:  Zenon Mayo, RN 05/09/2016, 9:43 PM

## 2016-05-09 NOTE — Progress Notes (Signed)
[place] Progress Note Patient Name: ABHIMANYU CRUCES DOB: 06/10/1950 MRN: [id number]   Date of Service  05/09/2016  HPI/Events of Note  K+ = 3.0, Mg++ = 1.9 and Creatinine = 1.25.  eICU Interventions  Will replace Mg++ and K+.     Intervention Category Major Interventions: Electrolyte abnormality - evaluation and management  Rustin Erhart [place] 05/09/2016, 6:11 AM

## 2016-05-10 ENCOUNTER — Inpatient Hospital Stay (HOSPITAL_COMMUNITY): Payer: Medicare Other

## 2016-05-10 LAB — CBC
HCT: 30.4 % — ABNORMAL LOW (ref 39.0–52.0)
Hemoglobin: 8.6 g/dL — ABNORMAL LOW (ref 13.0–17.0)
MCH: 26.5 pg (ref 26.0–34.0)
MCHC: 28.3 g/dL — ABNORMAL LOW (ref 30.0–36.0)
MCV: 93.8 fL (ref 78.0–100.0)
Platelets: 244 10*3/uL (ref 150–400)
RBC: 3.24 MIL/uL — ABNORMAL LOW (ref 4.22–5.81)
RDW: 16.5 % — AB (ref 11.5–15.5)
WBC: 10.6 10*3/uL — ABNORMAL HIGH (ref 4.0–10.5)

## 2016-05-10 LAB — BASIC METABOLIC PANEL
Anion gap: 10 (ref 5–15)
BUN: 35 mg/dL — AB (ref 6–20)
CALCIUM: 9.3 mg/dL (ref 8.9–10.3)
CO2: 33 mmol/L — AB (ref 22–32)
CREATININE: 1.51 mg/dL — AB (ref 0.61–1.24)
Chloride: 107 mmol/L (ref 101–111)
GFR calc non Af Amer: 47 mL/min — ABNORMAL LOW (ref >60)
GFR, EST AFRICAN AMERICAN: 55 mL/min — AB (ref >60)
Glucose, Bld: 126 mg/dL — ABNORMAL HIGH (ref 65–99)
Potassium: 3.8 mmol/L (ref 3.5–5.1)
SODIUM: 150 mmol/L — AB (ref 135–145)

## 2016-05-10 LAB — GLUCOSE, CAPILLARY
GLUCOSE-CAPILLARY: 114 mg/dL — AB (ref 65–99)
GLUCOSE-CAPILLARY: 128 mg/dL — AB (ref 65–99)
Glucose-Capillary: 120 mg/dL — ABNORMAL HIGH (ref 65–99)
Glucose-Capillary: 122 mg/dL — ABNORMAL HIGH (ref 65–99)
Glucose-Capillary: 124 mg/dL — ABNORMAL HIGH (ref 65–99)
Glucose-Capillary: 130 mg/dL — ABNORMAL HIGH (ref 65–99)

## 2016-05-10 LAB — MAGNESIUM: MAGNESIUM: 2.5 mg/dL — AB (ref 1.7–2.4)

## 2016-05-10 LAB — PHOSPHORUS: PHOSPHORUS: 2.7 mg/dL (ref 2.5–4.6)

## 2016-05-10 MED ORDER — QUETIAPINE FUMARATE 100 MG PO TABS
100.0000 mg | ORAL_TABLET | Freq: Two times a day (BID) | ORAL | Status: DC
  Administered 2016-05-10 – 2016-05-13 (×7): 100 mg
  Filled 2016-05-10 (×7): qty 1

## 2016-05-10 MED ORDER — LABETALOL HCL 300 MG PO TABS
300.0000 mg | ORAL_TABLET | Freq: Three times a day (TID) | ORAL | Status: DC
  Administered 2016-05-10 – 2016-05-11 (×4): 300 mg
  Filled 2016-05-10 (×4): qty 1

## 2016-05-10 NOTE — Progress Notes (Addendum)
09:00 Placed order for referral to LTAC. Notified Juliann Pulse [phone] with [facility] and [place] with [facility] [phone]. 09:15 Spoke with patient at bedside, he nodded in agreement to speak with wife, spoke with [name], spouse, she was provided with explanation of role of LTACs, names of [facility] and [facility] and verbalized understanding that she would hear from [place] as part of referral process.  Name Relation Home Work [place] Spouse [phone]  [phone]   13:00 Verified with spouse [name] choice for LTAC is [facility]. Spoke with Dr Hulen Skains, her verifed plan is for DC to [facility] tomorrow. Updated [place] clinical liaison with [facility] of DC date.

## 2016-05-10 NOTE — Progress Notes (Signed)
Progress Note  Presenting HPI:  66 y.o. 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 Presented on 2/20 for reversal of colostomy , developed recurrent abdominal pain on 2/26 and taken back to the OR on 2/27 finding free air with peritonitis and anastomotic leakage. Patient returned from the operating room on ventilator and with ongoing shock.  IMAGING/STUDIES: TTE 2/24: LV normal in size with moderate LVH. EF 55-60% with no regional wall motion abnormalities.   MICROBIOLOGY: Blood Cultures x2 2/27 >>>ng Tracheal Aspirate Culture 2/28 >>> nml flora MRSA PCR 2/28:  Negative  Blood Cultures x2 3/2 >>>ng Urine Culture 3/2 >>>ng  ANTIBIOTICS: Cefotan 2/20 (periop prophylaxis) Zosyn 2/27 >>>D/C after a total of 14 days Eraxis 2/28 >>>D/C after a total of 14 days  SIGNIFICANT EVENTS: 02/20 - Admit for colostomy reversal 02/23 - Postop ileus & tachycardia 02/24 - Cardiology Consulted 02/27 - OR for Exploratory Laparotomy finding anastomotic leak & peritonitis  3/2   transitioned to Precedex infusion and  fentanyl drip, febrile  Subjective:   Weaned PS 12, more awake  Vent Mode: PRVC FiO2 (%):  [40 %] 40 % Set Rate:  [14 bmp] 14 bmp Vt Set:  [580 mL] 580 mL PEEP:  [5 cmH20] 5 cmH20 Pressure Support:  [10 VFI43-32 cmH20] 10 cmH20 Plateau Pressure:  [19 cmH20-23 cmH20] 23 cmH20  Temp:  [99.3 F (37.4 C)-100.3 F (37.9 C)] 100.1 F (37.8 C) (03/20 0400) Pulse Rate:  [90-160] 111 (03/20 0700) Resp:  [15-26] 24 (03/20 0700) BP: (110-202)/(58-106) 202/99 (03/20 0700) SpO2:  [90 %-100 %] 95 % (03/20 0700) FiO2 (%):  [40 %] 40 % (03/20 0400) Weight:  [148.8 kg (328 lb)] 148.8 kg (328 lb) (03/20 0500)  General: Awake and alert, following commands, calm, in NAD Neuro: Awake and following commands, more awake HEENT: [place]/A, alertT, PERRL, EOM-spontaneous and MMM PULM:Coarse  CV:  RRR, no rub, murmur or gallop, S1, S2 GI:  Soft, tender to palpation, BS diminished, stoma pink, good output  Extremities: Warm and dry, trace edema   LINES/TUBES: OETT 7.5 2/27 >>> R IJ CVL 2/27 >>>  LUE DL PICC 2/26 >>> Foley >>> L NGT 2/27 >>> L RADIAL ART LINE 2/27 >>> discontinued PIV  CBC Latest Ref Rng & Units 05/10/2016 05/09/2016 05/08/2016  WBC 4.0 - 10.5 K/uL 10.6(H) 8.7 6.7  Hemoglobin 13.0 - 17.0 g/dL 8.6(L) 7.9(L) 7.4(L)  Hematocrit 39.0 - 52.0 % 30.4(L) 27.0(L) 25.6(L)  Platelets 150 - 400 K/uL 244 214 177   BMP Latest Ref Rng & Units 05/10/2016 05/09/2016 05/09/2016  Glucose 65 - 99 mg/dL 126(H) 125(H) 121(H)  BUN 6 - 20 mg/dL 35(H) 33(H) 30(H)  Creatinine 0.61 - 1.24 mg/dL 1.51(H) 1.50(H) 1.25(H)  Sodium 135 - 145 mmol/L 150(H) 150(H) 149(H)  Potassium 3.5 - 5.1 mmol/L 3.8 3.7 3.0(L)  Chloride 101 - 111 mmol/L 107 109 109  CO2 22 - 32 mmol/L 33(H) 32 30  Calcium 8.9 - 10.3 mg/dL 9.3 9.2 8.1(L)   Hepatic Function Latest Ref Rng & Units 05/02/2016 05/01/2016 04/28/2016  Total Protein 6.5 - 8.1 g/dL 5.7(L) 6.5 5.4(L)  Albumin 3.5 - 5.0 g/dL 1.7(L) 1.8(L) 1.5(L)  AST 15 - 41 U/L 50(H) 35 24  ALT 17 - 63 U/L 49 38 26  Alk Phosphatase 38 - 126 U/L 103 103 81  Total Bilirubin 0.3 - 1.2 mg/dL 0.5 0.7 0.5  Bilirubin, Direct <=0.2 mg/dL - - -   ASSESSMENT/PLAN:  66 y.o. male,  initially admitted for reversal of ostomy, ended up with peritonitis from bowel anastomotic leak. Off pressors.  Had bowel issues post op but better.  Main issue is agitation/sedation and weaning. S/P trache on 3/13.   1. S/P Septic shock (resolved) secondary to peritonitis.  ABX completed, follow fever curve, pseudomonas noted but colonizer likely as he is progressing, if spike would then add cipro 2. Acute on chronic hypoxemic and hypercarbic respiratory failure: this am PS 5 Tv is 550 and rate 21, would advance to TC trial attempt, goal 6 hours, will remove sutures 3. Sedation for severe agitation. Baseline anxiety. Cont PO Klonopin and seroquel.  Fentanyl patch Mag and K replaced 3/19. Close QTc monitoring. 0.47 3/19. Increase ser to limit benzo and deliiurm risk 4. Acute on chronic stage III renal failure.  Lasix 40 mg IV q6 x3 doses 3/18, Net negative 4 L last 24. CVP is 3 this am.CXR 3/20 5. Hyperglycemia, monitor, SSI as indicated. 6. History of coronary artery disease: ASA daily, Telemetry 7. HTN: increase PO labetalol PO TID with holding parameters. PRN labetalol and hydralazine.   Ccm time 30 min  Lavon Paganini. Titus Mould, MD, [place] Pgr: [place] [facility] 05/10/2016 8:13 AM

## 2016-05-10 NOTE — Progress Notes (Signed)
GS Progress Note Subjective: Patient is looking wildly into space  No acute distress  Objective: Vital signs in last 24 hours: Temp:  [99.3 F (37.4 C)-100.3 F (37.9 C)] 100.1 F (37.8 C) (03/20 0400) Pulse Rate:  [90-160] 111 (03/20 0700) Resp:  [15-26] 24 (03/20 0700) BP: (110-202)/(58-106) 202/99 (03/20 0700) SpO2:  [90 %-100 %] 95 % (03/20 0700) FiO2 (%):  [40 %] 40 % (03/20 0400) Weight:  [148.8 kg (328 lb)] 148.8 kg (328 lb) (03/20 0500) Last BM Date: 05/09/16  Intake/Output from previous day: 03/19 0701 - 03/20 0700 In: 2391.2 [I.V.:951.2; NG/GT:1440] Out: 1017 [Urine:2825; Stool:1100] Intake/Output this shift: No intake/output data recorded.  Lungs: Clear, Says are okay on FIO2 40%.  Weaned on 12/5  Abd: Distended, but soft.  No need for CT or other studies.  Extremities: No changes.  Some wrinkling of the skin  Neuro: Intact  Lab Results: CBC   Recent Labs  05/09/16 0414 05/10/16 0428  WBC 8.7 10.6*  HGB 7.9* 8.6*  HCT 27.0* 30.4*  PLT 214 244   BMET  Recent Labs  05/09/16 1544 05/10/16 0428  NA 150* 150*  K 3.7 3.8  CL 109 107  CO2 32 33*  GLUCOSE 125* 126*  BUN 33* 35*  CREATININE 1.50* 1.51*  CALCIUM 9.2 9.3   PT/INR No results for input(s): LABPROT, INR in the last 72 hours. ABG  Recent Labs  05/08/16 0335  PHART 7.359  HCO3 33.0*    Studies/Results: No results found.  Anti-infectives: Anti-infectives    Start     Dose/Rate Route Frequency Ordered Stop   04/21/16 1000  anidulafungin (ERAXIS) 100 mg in sodium chloride 0.9 % 100 mL IVPB     100 mg over 90 Minutes Intravenous Every 24 hours 04/20/16 1008 05/02/16 1115   04/20/16 1045  anidulafungin (ERAXIS) 200 mg in sodium chloride 0.9 % 200 mL IVPB     200 mg over 180 Minutes Intravenous  Once 04/20/16 1008 04/20/16 1422   04/19/16 2030  piperacillin-tazobactam (ZOSYN) IVPB 3.375 g     3.375 g 12.5 mL/hr over 240 Minutes Intravenous Every 8 hours 04/19/16 1958 05/02/16  2359   04/19/16 1230  cefoTEtan in Dextrose 5% (CEFOTAN) 2-2.08 GM-% IVPB    Comments:  Ivar Drape   : cabinet override      04/19/16 1230 04/19/16 1245   04/12/16 2000  cefoTEtan (CEFOTAN) 2 g in dextrose 5 % 50 mL IVPB     2 g 100 mL/hr over 30 Minutes Intravenous Every 12 hours 04/12/16 1120 04/12/16 2125   04/12/16 0700  cefoTEtan (CEFOTAN) 2 g in dextrose 5 % 50 mL IVPB     2 g 100 mL/hr over 30 Minutes Intravenous To [facility] 04/11/16 1404 04/12/16 0753   04/12/16 0548  cefoTEtan in Dextrose 5% (CEFOTAN) 2-2.08 GM-% IVPB    Comments:  Precious Haws   : cabinet override      04/12/16 0548 04/12/16 1759   04/12/16 0544  neomycin (MYCIFRADIN) tablet 1,000 mg  Status:  Discontinued     1,000 mg Oral 3 times per day 04/12/16 0544 04/12/16 0547   04/12/16 0544  metroNIDAZOLE (FLAGYL) tablet 1,000 mg  Status:  Discontinued     1,000 mg Oral 3 times per day 04/12/16 0544 04/12/16 0547      Assessment/Plan: s/p Procedure(s): EXPLORATORY LAPAROTOMY, REPAIR OF ANASTAMOTIC LEAK COLOSTOMY Try to ge the patient to trach collar.  LOS: 28 days    Kathryne Eriksson.  Dahlia Bailiff, MD, FACS [phone] [facility] Surgery 05/10/2016

## 2016-05-10 NOTE — Progress Notes (Signed)
Trach sutures removed with no bleeding. No complications.

## 2016-05-11 ENCOUNTER — Other Ambulatory Visit: Payer: Medicare Other

## 2016-05-11 ENCOUNTER — Ambulatory Visit: Payer: Medicare Other | Admitting: Nurse Practitioner

## 2016-05-11 LAB — CBC WITH DIFFERENTIAL/PLATELET
BASOS PCT: 0 %
Basophils Absolute: 0 10*3/uL (ref 0.0–0.1)
EOS ABS: 0.3 10*3/uL (ref 0.0–0.7)
Eosinophils Relative: 3 %
HCT: 30.4 % — ABNORMAL LOW (ref 39.0–52.0)
HEMOGLOBIN: 8.3 g/dL — AB (ref 13.0–17.0)
Lymphocytes Relative: 15 %
Lymphs Abs: 1.4 10*3/uL (ref 0.7–4.0)
MCH: 25.9 pg — ABNORMAL LOW (ref 26.0–34.0)
MCHC: 27.3 g/dL — ABNORMAL LOW (ref 30.0–36.0)
MCV: 95 fL (ref 78.0–100.0)
Monocytes Absolute: 0.9 10*3/uL (ref 0.1–1.0)
Monocytes Relative: 10 %
NEUTROS PCT: 72 %
Neutro Abs: 6.8 10*3/uL (ref 1.7–7.7)
Platelets: 229 10*3/uL (ref 150–400)
RBC: 3.2 MIL/uL — AB (ref 4.22–5.81)
RDW: 16.4 % — ABNORMAL HIGH (ref 11.5–15.5)
WBC: 9.4 10*3/uL (ref 4.0–10.5)

## 2016-05-11 LAB — GLUCOSE, CAPILLARY
GLUCOSE-CAPILLARY: 102 mg/dL — AB (ref 65–99)
Glucose-Capillary: 103 mg/dL — ABNORMAL HIGH (ref 65–99)
Glucose-Capillary: 103 mg/dL — ABNORMAL HIGH (ref 65–99)
Glucose-Capillary: 105 mg/dL — ABNORMAL HIGH (ref 65–99)
Glucose-Capillary: 112 mg/dL — ABNORMAL HIGH (ref 65–99)
Glucose-Capillary: 119 mg/dL — ABNORMAL HIGH (ref 65–99)

## 2016-05-11 LAB — URINALYSIS, ROUTINE W REFLEX MICROSCOPIC
Bilirubin Urine: NEGATIVE
Glucose, UA: NEGATIVE mg/dL
Ketones, ur: NEGATIVE mg/dL
NITRITE: POSITIVE — AB
PH: 5.5 (ref 5.0–8.0)
Protein, ur: 100 mg/dL — AB
SPECIFIC GRAVITY, URINE: 1.025 (ref 1.005–1.030)

## 2016-05-11 LAB — URINALYSIS, MICROSCOPIC (REFLEX)

## 2016-05-11 LAB — BASIC METABOLIC PANEL
Anion gap: 8 (ref 5–15)
BUN: 51 mg/dL — AB (ref 6–20)
CHLORIDE: 109 mmol/L (ref 101–111)
CO2: 31 mmol/L (ref 22–32)
CREATININE: 2.18 mg/dL — AB (ref 0.61–1.24)
Calcium: 9.1 mg/dL (ref 8.9–10.3)
GFR calc Af Amer: 35 mL/min — ABNORMAL LOW (ref >60)
GFR calc non Af Amer: 30 mL/min — ABNORMAL LOW (ref >60)
GLUCOSE: 116 mg/dL — AB (ref 65–99)
Potassium: 3.9 mmol/L (ref 3.5–5.1)
SODIUM: 148 mmol/L — AB (ref 135–145)

## 2016-05-11 LAB — OSMOLALITY, URINE: OSMOLALITY UR: 527 mosm/kg (ref 300–900)

## 2016-05-11 LAB — SODIUM, URINE, RANDOM: SODIUM UR: 49 mmol/L

## 2016-05-11 MED ORDER — ACETAMINOPHEN 160 MG/5ML PO SOLN: 650.0000 mg | Freq: Four times a day (QID) | ORAL | Status: DC | PRN

## 2016-05-11 MED ORDER — LABETALOL HCL 200 MG PO TABS
200.0000 mg | ORAL_TABLET | Freq: Three times a day (TID) | ORAL | Status: DC
  Administered 2016-05-11 – 2016-05-13 (×6): 200 mg
  Filled 2016-05-11 (×6): qty 1

## 2016-05-11 MED ORDER — FENTANYL CITRATE (PF) 100 MCG/2ML IJ SOLN
25.0000 ug | INTRAMUSCULAR | Status: DC | PRN
  Administered 2016-05-12 – 2016-05-13 (×7): 50 ug via INTRAVENOUS
  Filled 2016-05-11 (×7): qty 2

## 2016-05-11 NOTE — Progress Notes (Signed)
Orthopedic Tech Progress Note Patient Details:  Larry [name] 05-10-1950 [id number]  Ortho Devices Type of Ortho Device: Abdominal binder Ortho Device/Splint Location: abdomen Ortho Device/Splint Interventions: [name], [name] 05/11/2016, 10:18 AM

## 2016-05-11 NOTE — Progress Notes (Deleted)
SLP  Note  Patient Details Name: Larry [name] MRN: [id number] DOB: Jun 14, 1950  SLP services following along for readiness.  PCCM note addresses PMV trials - please order.  RN states secretions today are prohibitive.  Will follow.         Julien Girt Ogles 05/11/2016, 3:23 PM

## 2016-05-11 NOTE — Progress Notes (Signed)
[facility] Progress Note Patient Name: Larry [name] DOB: Apr 05, 1950 MRN: [id number]   Date of Service  05/11/2016  HPI/Events of Note  Fentanyl IV infusion now off. Nurse request new PRN Fentanyl order. Patient is current on a Fentanyl Patch 100 mcg/hr Q 72 hours.   eICU Interventions  Will order: 1. D/C Fentanyl IV infusion and bolus from infusion. Currently on Trach Collar O2.  2. Fentanyl 25-50 mcg IV Q 2 hours PRN pain.      Intervention Category Intermediate Interventions: Pain - evaluation and management  Kodee Drury Eugene 05/11/2016, 11:28 PM

## 2016-05-11 NOTE — Progress Notes (Signed)
Progress Note  Presenting HPI:  66 y.o. 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 Presented on 2/20 for reversal of colostomy , developed recurrent abdominal pain on 2/26 and taken back to the OR on 2/27 finding free air with peritonitis and anastomotic leakage. Patient returned from the operating room on ventilator and with ongoing shock.  IMAGING/STUDIES: TTE 2/24: LV normal in size with moderate LVH. EF 55-60% with no regional wall motion abnormalities.   MICROBIOLOGY: Blood Cultures x2 2/27 >>>ng Tracheal Aspirate Culture 2/28 >>> nml flora MRSA PCR 2/28:  Negative  Blood Cultures x2 3/2 >>>ng Urine Culture 3/2 >>>ng  ANTIBIOTICS: Cefotan 2/20 (periop prophylaxis) Zosyn 2/27 >>>D/C after a total of 14 days Eraxis 2/28 >>>D/C after a total of 14 days  SIGNIFICANT EVENTS: 02/20 - Admit for colostomy reversal 02/23 - Postop ileus & tachycardia 02/24 - Cardiology Consulted 02/27 - OR for Exploratory Laparotomy finding anastomotic leak & peritonitis  3/2   transitioned to Precedex infusion and  fentanyl drip, febrile  Subjective:   Trach collar success Some rise crt  Vent Mode: PRVC FiO2 (%):  [40 %] 40 % Set Rate:  [14 bmp] 14 bmp Vt Set:  [580 mL] 580 mL PEEP:  [5 cmH20] 5 cmH20  Temp:  [98.4 F (36.9 C)-99.7 F (37.6 C)] 99.6 F (37.6 C) (03/21 0800) Pulse Rate:  [87-109] 109 (03/21 0757) Resp:  [0-28] 24 (03/21 0757) BP: (80-174)/(49-93) 128/71 (03/21 0757) SpO2:  [92 %-97 %] 95 % (03/21 0757) FiO2 (%):  [40 %] 40 % (03/21 0757) Weight:  [153.8 kg (339 lb)] 153.8 kg (339 lb) (03/21 0500)  General: Awake and alert, following commands, calm, in NAD Neuro: Awake and following commands, more awake HEENT: [place]/A, alertT, PERRL, EOM-spontaneous and MMM PULM:Coarse  CV:  RRR, no rub, murmur or gallop, S1, S2 GI: Soft, tender to palpation, BS diminished, stoma pink, good output  Extremities: Warm and dry,  trace edema   LINES/TUBES: OETT 7.5 2/27 >>> R IJ CVL 2/27 >>>  LUE DL PICC 2/26 >>> Foley >>> L NGT 2/27 >>> L RADIAL ART LINE 2/27 >>> discontinued PIV  CBC Latest Ref Rng & Units 05/10/2016 05/09/2016 05/08/2016  WBC 4.0 - 10.5 K/uL 10.6(H) 8.7 6.7  Hemoglobin 13.0 - 17.0 g/dL 8.6(L) 7.9(L) 7.4(L)  Hematocrit 39.0 - 52.0 % 30.4(L) 27.0(L) 25.6(L)  Platelets 150 - 400 K/uL 244 214 177   BMP Latest Ref Rng & Units 05/11/2016 05/10/2016 05/09/2016  Glucose 65 - 99 mg/dL 116(H) 126(H) 125(H)  BUN 6 - 20 mg/dL 51(H) 35(H) 33(H)  Creatinine 0.61 - 1.24 mg/dL 2.18(H) 1.51(H) 1.50(H)  Sodium 135 - 145 mmol/L 148(H) 150(H) 150(H)  Potassium 3.5 - 5.1 mmol/L 3.9 3.8 3.7  Chloride 101 - 111 mmol/L 109 107 109  CO2 22 - 32 mmol/L 31 33(H) 32  Calcium 8.9 - 10.3 mg/dL 9.1 9.3 9.2   Hepatic Function Latest Ref Rng & Units 05/02/2016 05/01/2016 04/28/2016  Total Protein 6.5 - 8.1 g/dL 5.7(L) 6.5 5.4(L)  Albumin 3.5 - 5.0 g/dL 1.7(L) 1.8(L) 1.5(L)  AST 15 - 41 U/L 50(H) 35 24  ALT 17 - 63 U/L 49 38 26  Alk Phosphatase 38 - 126 U/L 103 103 81  Total Bilirubin 0.3 - 1.2 mg/dL 0.5 0.7 0.5  Bilirubin, Direct <=0.2 mg/dL - - -   ASSESSMENT/PLAN:  66 y.o. male, initially admitted for reversal of ostomy, ended up with peritonitis from bowel anastomotic leak. Off  pressors.  Had bowel issues post op but better.  Main issue is agitation/sedation and weaning. S/P trache on 3/13.   1. S/P Septic shock (resolved) secondary to peritonitis.  Again , pseudomonas appears clinically as colinizer 2. Acute on chronic hypoxemic and hypercarbic respiratory failure: did well on TC, repeat TC , likley still needs vent support tonight, goal TC 12 hours, then PMV and  Cuff down if secretions allow Sedation for severe agitation. Improved, tolerating seraquel to limit benzo, would hold off haldol  IV fo rnow with resolved prior qtc 3. Acute on chronic stage III renal failure.  Lasix has been on hold , bun./ crt rise noted,  continued to correct NA, follow crt trend, no need renal at this stage, assess ua, urine osm, na 4. Hyperglycemia, monitor, SSI 5. History of coronary artery disease: ASA daily, Telemetry 6. HTN: increase PO labetalol PO TID, may need reduction as just increased and some labeil BP   To sdu   Lavon Paganini. Titus Mould, MD, [place] Pgr: [place] [facility] 05/11/2016 8:53 AM

## 2016-05-11 NOTE — Progress Notes (Signed)
190 mL of Fentanyl wasted in sink. Verified and witnessed by Gladys Damme RN.    Larry [name]

## 2016-05-11 NOTE — Progress Notes (Signed)
GS Progress Note Subjective: Patient currently on trach collar.  Probably will be placed back on the ventilator soon.  Lots of secretions.  Objective: Vital signs in last 24 hours: Temp:  [98.4 F (36.9 C)-99.7 F (37.6 C)] 99.6 F (37.6 C) (03/21 0800) Pulse Rate:  [87-109] 109 (03/21 0757) Resp:  [0-28] 24 (03/21 0757) BP: (80-174)/(49-93) 128/71 (03/21 0757) SpO2:  [92 %-97 %] 95 % (03/21 0757) FiO2 (%):  [40 %] 40 % (03/21 0757) Weight:  [153.8 kg (339 lb)] 153.8 kg (339 lb) (03/21 0500) Last BM Date: 05/10/16  Intake/Output from previous day: 03/20 0701 - 03/21 0700 In: 3814.5 [I.V.:1944.5; NG/GT:1870] Out: 4250 [Urine:2775; ZSWFU:9323] Intake/Output this shift: No intake/output data recorded.  Lungs: coarse breath sounds bilaterally and lots of secretions.  Abd: Softer than before.  Ileostomy output > 1400cc last 24 hours.  Free water has been increased and getting IVFs also.  BUN and creatinine increased.  Extremities: No changes  Neuro: Agitated and anxious.    Lab Results: CBC   Recent Labs  05/09/16 0414 05/10/16 0428  WBC 8.7 10.6*  HGB 7.9* 8.6*  HCT 27.0* 30.4*  PLT 214 244   BMET  Recent Labs  05/10/16 0428 05/11/16 0426  NA 150* 148*  K 3.8 3.9  CL 107 109  CO2 33* 31  GLUCOSE 126* 116*  BUN 35* 51*  CREATININE 1.51* 2.18*  CALCIUM 9.3 9.1   PT/INR No results for input(s): LABPROT, INR in the last 72 hours. ABG No results for input(s): PHART, HCO3 in the last 72 hours.  Invalid input(s): PCO2, PO2  Studies/Results: Dg Chest Port 1 View  Result Date: 05/10/2016 CLINICAL DATA:  Tracheostomy, respiratory failure EXAM: PORTABLE CHEST 1 VIEW COMPARISON:  05/08/2016 FINDINGS: Cardiomegaly again noted. Stable tracheostomy tube position. NG tube is unchanged in position. Central mild vascular congestion without convincing pulmonary edema. Persistent small right pleural effusion with right basilar atelectasis or infiltrate. Trace left  pleural effusion with left basilar atelectasis. IMPRESSION: Central mild vascular congestion without convincing pulmonary edema. Persistent small right pleural effusion with right basilar atelectasis or infiltrate. Trace left pleural effusion with left basilar atelectasis. Electronically Signed   By: Lahoma Crocker M.D.   On: 05/10/2016 08:02    Anti-infectives: Anti-infectives    Start     Dose/Rate Route Frequency Ordered Stop   04/21/16 1000  anidulafungin (ERAXIS) 100 mg in sodium chloride 0.9 % 100 mL IVPB     100 mg over 90 Minutes Intravenous Every 24 hours 04/20/16 1008 05/02/16 1115   04/20/16 1045  anidulafungin (ERAXIS) 200 mg in sodium chloride 0.9 % 200 mL IVPB     200 mg over 180 Minutes Intravenous  Once 04/20/16 1008 04/20/16 1422   04/19/16 2030  piperacillin-tazobactam (ZOSYN) IVPB 3.375 g     3.375 g 12.5 mL/hr over 240 Minutes Intravenous Every 8 hours 04/19/16 1958 05/02/16 2359   04/19/16 1230  cefoTEtan in Dextrose 5% (CEFOTAN) 2-2.08 GM-% IVPB    Comments:  Ivar Drape   : cabinet override      04/19/16 1230 04/19/16 1245   04/12/16 2000  cefoTEtan (CEFOTAN) 2 g in dextrose 5 % 50 mL IVPB     2 g 100 mL/hr over 30 Minutes Intravenous Every 12 hours 04/12/16 1120 04/12/16 2125   04/12/16 0700  cefoTEtan (CEFOTAN) 2 g in dextrose 5 % 50 mL IVPB     2 g 100 mL/hr over 30 Minutes Intravenous To [facility] 04/11/16 1404  04/12/16 0753   04/12/16 0548  cefoTEtan in Dextrose 5% (CEFOTAN) 2-2.08 GM-% IVPB    Comments:  Precious Haws   : cabinet override      04/12/16 0548 04/12/16 1759   04/12/16 0544  neomycin (MYCIFRADIN) tablet 1,000 mg  Status:  Discontinued     1,000 mg Oral 3 times per day 04/12/16 0544 04/12/16 0547   04/12/16 0544  metroNIDAZOLE (FLAGYL) tablet 1,000 mg  Status:  Discontinued     1,000 mg Oral 3 times per day 04/12/16 0544 04/12/16 0547      Assessment/Plan: s/p Procedure(s): EXPLORATORY LAPAROTOMY, REPAIR OF ANASTAMOTIC  LEAK COLOSTOMY Overall the patient does not seem to be as stable as he was a couple of days ago.  Renal function is worse.  Struggling to stay on trach collar.  Ielostomy output increased and getting more free water.  Off of Lasix now.  I do not feel as though the patient is stable enough to go to [facility] today with worsening renal function and other changes.  Will check CBC   LOS: 29 days    Kathryne Eriksson. Dahlia Bailiff, MD, FACS [phone] [facility] 05/11/2016

## 2016-05-11 NOTE — Progress Notes (Signed)
Patient placed on 40% trach collar.  Currently tolerating well.  Will continue to monitor.  

## 2016-05-11 NOTE — Progress Notes (Signed)
Patient has been approved for LTAC, wants [facility], has bed available today, bed availability will need to be confirmed daily. Not appropriate to move today per Dr Hulen Skains 2/2 worsening kidney status.

## 2016-05-11 NOTE — Progress Notes (Signed)
SLP  Note  Patient Details Name: Larry [name] MRN: [id number] DOB: [birth year]  SLP services following along for readiness.  PCCM note addresses PMV trials - please order.  RN states secretions today are prohibitive.  Will follow.         [name] Laurice 05/11/2016, 2:40 PM

## 2016-05-11 NOTE — Progress Notes (Signed)
Nutrition Follow Up  DOCUMENTATION CODES:   Morbid obesity  INTERVENTION:    Continue Vital AF 1.2 formula at goal of rate of 60 ml/hr with Prostat liquid protein 60 ml TID  TF regimen providing 2328 kcals, 198 gm protein, 1168 ml of free water  NUTRITION DIAGNOSIS:   Inadequate oral intake related to inability to eat as evidenced by NPO status; ongoing  GOAL:   Provide needs based on ASPEN/SCCM guidelines; met  MONITOR:   TF tolerance, Vent status, Labs, Weight trends, Skin, I & O's  ASSESSMENT:   66 yo Male who is several years out from an emergency colectomy with colostomy of the right transverse colon for an obstructing tumor; presented for reversal of his colostomy.  Pt s/p procedure 2/21: REVERSAL OF COLOSTOMY  Patient is currently on ventilator support Temp (24hrs), Avg:99.1 F (37.3 C), Min:98.2 F (36.8 C), Max:99.7 F (37.6 C)  Pt s/p bedside tracheostomy 3/13. Vital AF 1.2 formula continues to infuse at goal rate of 60 ml/hr via CORTRAK small bore feeding tube; tolerating well. Labs reviewed.  Sodium 148 (H). CBG's [id number]. Dispo: LTACH.  Diet Order:  Diet NPO time specified  Skin:  Wound (see comment) (Stage I to lip)  Last BM:  3/21  Height:   Ht Readings from Last 1 Encounters:  04/22/16 5' 10.5" (1.791 m)    Weight:   Wt Readings from Last 1 Encounters:  05/11/16 (!) 339 lb (153.8 kg)  Admit weight 04/12/16 377 lbs (171 kg)  Ideal Body Weight:  75 kg  BMI:  Body mass index is 47.95 kg/m.  Estimated Nutritional Needs:   Kcal:  8110-3159  Protein:  >/= 187 gm  Fluid:  per MD  EDUCATION NEEDS:   No education needs identified at this time  Larry [name], RD, LDN Pager #: [phone] After-Hours Pager #: [phone]

## 2016-05-12 LAB — CBC WITH DIFFERENTIAL/PLATELET
Basophils Absolute: 0 10*3/uL (ref 0.0–0.1)
Basophils Relative: 0 %
EOS ABS: 0.3 10*3/uL (ref 0.0–0.7)
EOS PCT: 3 %
HCT: 28.5 % — ABNORMAL LOW (ref 39.0–52.0)
Hemoglobin: 7.9 g/dL — ABNORMAL LOW (ref 13.0–17.0)
Lymphocytes Relative: 13 %
Lymphs Abs: 1.3 10*3/uL (ref 0.7–4.0)
MCH: 26.2 pg (ref 26.0–34.0)
MCHC: 27.7 g/dL — ABNORMAL LOW (ref 30.0–36.0)
MCV: 94.7 fL (ref 78.0–100.0)
Monocytes Absolute: 1.1 10*3/uL — ABNORMAL HIGH (ref 0.1–1.0)
Monocytes Relative: 11 %
Neutro Abs: 7.3 10*3/uL (ref 1.7–7.7)
Neutrophils Relative %: 73 %
PLATELETS: 239 10*3/uL (ref 150–400)
RBC: 3.01 MIL/uL — AB (ref 4.22–5.81)
RDW: 16.4 % — ABNORMAL HIGH (ref 11.5–15.5)
WBC: 10 10*3/uL (ref 4.0–10.5)

## 2016-05-12 LAB — BASIC METABOLIC PANEL
ANION GAP: 10 (ref 5–15)
BUN: 49 mg/dL — AB (ref 6–20)
CO2: 30 mmol/L (ref 22–32)
Calcium: 9.2 mg/dL (ref 8.9–10.3)
Chloride: 104 mmol/L (ref 101–111)
Creatinine, Ser: 2.03 mg/dL — ABNORMAL HIGH (ref 0.61–1.24)
GFR calc Af Amer: 38 mL/min — ABNORMAL LOW (ref >60)
GFR calc non Af Amer: 33 mL/min — ABNORMAL LOW (ref >60)
Glucose, Bld: 192 mg/dL — ABNORMAL HIGH (ref 65–99)
POTASSIUM: 3.7 mmol/L (ref 3.5–5.1)
SODIUM: 144 mmol/L (ref 135–145)

## 2016-05-12 LAB — GLUCOSE, CAPILLARY
GLUCOSE-CAPILLARY: 105 mg/dL — AB (ref 65–99)
GLUCOSE-CAPILLARY: 111 mg/dL — AB (ref 65–99)
GLUCOSE-CAPILLARY: 111 mg/dL — AB (ref 65–99)
GLUCOSE-CAPILLARY: 114 mg/dL — AB (ref 65–99)
Glucose-Capillary: 125 mg/dL — ABNORMAL HIGH (ref 65–99)
Glucose-Capillary: 113 mg/dL — ABNORMAL HIGH (ref 65–99)

## 2016-05-12 MED ORDER — FREE WATER
200.0000 mL | Freq: Three times a day (TID) | Status: DC
  Administered 2016-05-12: 200 mL

## 2016-05-12 MED ORDER — ALTEPLASE 2 MG IJ SOLR
2.0000 mg | Freq: Once | INTRAMUSCULAR | Status: AC
  Administered 2016-05-12: 2 mg
  Filled 2016-05-12: qty 2

## 2016-05-12 MED ORDER — ALTEPLASE 2 MG IJ SOLR
2.0000 mg | Freq: Once | INTRAMUSCULAR | Status: AC
  Administered 2016-05-12: 2 mg
  Filled 2016-05-12 (×2): qty 2

## 2016-05-12 NOTE — Progress Notes (Signed)
Progress Note  Presenting HPI:  66 y.o. 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 Presented on 2/20 for reversal of colostomy , developed recurrent abdominal pain on 2/26 and taken back to the OR on 2/27 finding free air with peritonitis and anastomotic leakage. Patient returned from the operating room on ventilator and with ongoing shock.  IMAGING/STUDIES: TTE 2/24: LV normal in size with moderate LVH. EF 55-60% with no regional wall motion abnormalities.   MICROBIOLOGY: Blood Cultures x2 2/27 >>>ng Tracheal Aspirate Culture 2/28 >>> nml flora MRSA PCR 2/28:  Negative  Blood Cultures x2 3/2 >>>ng Urine Culture 3/2 >>>ng  ANTIBIOTICS: Cefotan 2/20 (periop prophylaxis) Zosyn 2/27 >>>D/C after a total of 14 days Eraxis 2/28 >>>D/C after a total of 14 days  SIGNIFICANT EVENTS: 02/20 - Admit for colostomy reversal 02/23 - Postop ileus & tachycardia 02/24 - Cardiology Consulted 02/27 - OR for Exploratory Laparotomy finding anastomotic leak & peritonitis  3/2   transitioned to Precedex infusion and  fentanyl drip, febrile  Subjective:   In chair, tachy  Vent Mode: PRVC FiO2 (%):  [40 %] 40 % Set Rate:  [14 bmp] 14 bmp Vt Set:  [580 mL] 580 mL PEEP:  [5 cmH20] 5 cmH20 Plateau Pressure:  [19 cmH20-23 cmH20] 19 cmH20  Temp:  [98.1 F (36.7 C)-99.6 F (37.6 C)] 99.5 F (37.5 C) (03/22 0738) Pulse Rate:  [92-155] 155 (03/22 0805) Resp:  [14-27] 25 (03/22 0805) BP: (92-166)/(51-95) 92/80 (03/22 0805) SpO2:  [95 %-98 %] 98 % (03/22 0805) FiO2 (%):  [40 %] 40 % (03/22 0805) Weight:  [160.6 kg (354 lb)] 160.6 kg (354 lb) (03/22 0600)  General: Awake and alert, following commands, calm in chair position Neuro: Awake and following commands HEENT: [place]/A, alertT, PERRL, EOM-spontaneous and MMM PULM:Coarse improved CV:  RRR, no rub, murmur or gallop, S1, S2 GI: Soft, tender to palpation, BS diminished, stoma pink, good  output  Extremities: Warm and dry, trace edema   LINES/TUBES: OETT 7.5 2/27 >>> R IJ CVL 2/27 >>>  LUE DL PICC 2/26 >>> Foley >>> L NGT 2/27 >>> L RADIAL ART LINE 2/27 >>> discontinued PIV  CBC Latest Ref Rng & Units 05/12/2016 05/11/2016 05/10/2016  WBC 4.0 - 10.5 K/uL 10.0 9.4 10.6(H)  Hemoglobin 13.0 - 17.0 g/dL 7.9(L) 8.3(L) 8.6(L)  Hematocrit 39.0 - 52.0 % 28.5(L) 30.4(L) 30.4(L)  Platelets 150 - 400 K/uL 239 229 244   BMP Latest Ref Rng & Units 05/12/2016 05/11/2016 05/10/2016  Glucose 65 - 99 mg/dL 192(H) 116(H) 126(H)  BUN 6 - 20 mg/dL 49(H) 51(H) 35(H)  Creatinine 0.61 - 1.24 mg/dL 2.03(H) 2.18(H) 1.51(H)  Sodium 135 - 145 mmol/L 144 148(H) 150(H)  Potassium 3.5 - 5.1 mmol/L 3.7 3.9 3.8  Chloride 101 - 111 mmol/L 104 109 107  CO2 22 - 32 mmol/L 30 31 33(H)  Calcium 8.9 - 10.3 mg/dL 9.2 9.1 9.3   Hepatic Function Latest Ref Rng & Units 05/02/2016 05/01/2016 04/28/2016  Total Protein 6.5 - 8.1 g/dL 5.7(L) 6.5 5.4(L)  Albumin 3.5 - 5.0 g/dL 1.7(L) 1.8(L) 1.5(L)  AST 15 - 41 U/L 50(H) 35 24  ALT 17 - 63 U/L 49 38 26  Alk Phosphatase 38 - 126 U/L 103 103 81  Total Bilirubin 0.3 - 1.2 mg/dL 0.5 0.7 0.5  Bilirubin, Direct <=0.2 mg/dL - - -   ASSESSMENT/PLAN:  66 y.o. male, initially admitted for reversal of ostomy, ended up with peritonitis from  bowel anastomotic leak. Off pressors.  Had bowel issues post op but better.  Main issue is agitation/sedation and weaning. S/P trache on 3/13.   1. Acute on chronic hypoxemic and hypercarbic respiratory failure: after labetolol am dose, back to trach collar, goal 24 hours Sedation for severe agitation. Improved, tolerating seraquel, maintain 2. Acute on chronic stage III renal failure.  Continued to hold lasix, he is responding to free water d5w, crt down, UA spec grav and urine osm indicates dry, keep d5w to 75, ealry UTI? Unclear, dc foley, no fevers 3. Hyperglycemia, monitor, SSI 4. History of coronary artery disease: ASA daily,  Telemetry 5. HTN: increase PO labetalol PO TID, may need further HR control agents, follow response to labetolol today  Can go to ltach medically speaking Await sdu Lavon Paganini. Titus Mould, MD, [place] Pgr: [place] [facility] 05/12/2016 9:06 AM

## 2016-05-12 NOTE — Progress Notes (Signed)
Per Dr. Allena Earing, Dr. Hulen Skains does not want patient to be dc to [facility] today.  NCM notified Corinna with [facility] and Management consultant.

## 2016-05-12 NOTE — Progress Notes (Signed)
HR 150's- no SBT this morning d/t increased HR. RN aware.

## 2016-05-12 NOTE — Consult Note (Addendum)
[place] Nurse ostomy consultnote Surgical team is following for assessment and plan of care to abd wound. Pt is familiar with ostomy care, since he previously had a colostomy, according to the EMR Stoma type/location: Ileostomy stoma 80% red and viable, 20% slough in the middle, slightly above skin level, 1 3/4inches Peristomal assessment: Intact skin surrounding Output:Mod amt brown liquid stool, small amt bleeding to stoma edges when pouch was changed. Ostomy pouching: 1pc.  Education provided: Pt is on vent and no family members are present. [place] team members will plan to begin teaching sessions when stable and out of ICU. Supplies at the the bedside for staff nurse use. Enrolled patient in [place] program: No Julien Girt MSN, [place], [place], [place], [place]

## 2016-05-12 NOTE — Progress Notes (Signed)
GS Progress Note Subjective: The nurse and the technician had just gotten the patient up into a [place] chair with the hoyer lift.  Patient exhausted, and so was the staff.  No distress. He was on trach collar all day yesterday, and ventilated at night time.    Objective: Vital signs in last 24 hours: Temp:  [98.1 F (36.7 C)-99.6 F (37.6 C)] 99.4 F (37.4 C) (03/22 0401) Pulse Rate:  [92-146] 106 (03/22 0500) Resp:  [15-27] 24 (03/22 0500) BP: (98-169)/(51-89) 136/74 (03/22 0500) SpO2:  [95 %-98 %] 97 % (03/22 0500) FiO2 (%):  [40 %] 40 % (03/22 0400) Last BM Date: 05/11/16  Intake/Output from previous day: 03/21 0701 - 03/22 0700 In: 4045 [I.V.:2135; NG/GT:1910] Out: 3825 [Urine:1875; Stool:1950] Intake/Output this shift: Total I/O In: 1960 [I.V.:1000; NG/GT:960] Out: 1525 [Urine:800; Stool:725]  Lungs: coarse breath sounds bilaterally  Abd: good bowel sounds.  Over 1900 cc output from ileostomy yesterday  Extremities: No changes  Neuro: Intact.  Able to answer questions.  Lab Results: CBC   Recent Labs  05/11/16 0854 05/12/16 0433  WBC 9.4 10.0  HGB 8.3* 7.9*  HCT 30.4* 28.5*  PLT 229 239   BMET  Recent Labs  05/11/16 0426 05/12/16 0433  NA 148* 144  K 3.9 3.7  CL 109 104  CO2 31 30  GLUCOSE 116* 192*  BUN 51* 49*  CREATININE 2.18* 2.03*  CALCIUM 9.1 9.2   PT/INR No results for input(s): LABPROT, INR in the last 72 hours. ABG No results for input(s): PHART, HCO3 in the last 72 hours.  Invalid input(s): PCO2, PO2  Studies/Results: No results found.  Anti-infectives: Anti-infectives    Start     Dose/Rate Route Frequency Ordered Stop   04/21/16 1000  anidulafungin (ERAXIS) 100 mg in sodium chloride 0.9 % 100 mL IVPB     100 mg over 90 Minutes Intravenous Every 24 hours 04/20/16 1008 05/02/16 1115   04/20/16 1045  anidulafungin (ERAXIS) 200 mg in sodium chloride 0.9 % 200 mL IVPB     200 mg over 180 Minutes Intravenous  Once 04/20/16 1008  04/20/16 1422   04/19/16 2030  piperacillin-tazobactam (ZOSYN) IVPB 3.375 g     3.375 g 12.5 mL/hr over 240 Minutes Intravenous Every 8 hours 04/19/16 1958 05/02/16 2359   04/19/16 1230  cefoTEtan in Dextrose 5% (CEFOTAN) 2-2.08 GM-% IVPB    Comments:  Ivar Drape   : cabinet override      04/19/16 1230 04/19/16 1245   04/12/16 2000  cefoTEtan (CEFOTAN) 2 g in dextrose 5 % 50 mL IVPB     2 g 100 mL/hr over 30 Minutes Intravenous Every 12 hours 04/12/16 1120 04/12/16 2125   04/12/16 0700  cefoTEtan (CEFOTAN) 2 g in dextrose 5 % 50 mL IVPB     2 g 100 mL/hr over 30 Minutes Intravenous To ShortStay Surgical 04/11/16 1404 04/12/16 0753   04/12/16 0548  cefoTEtan in Dextrose 5% (CEFOTAN) 2-2.08 GM-% IVPB    Comments:  Precious Haws   : cabinet override      04/12/16 0548 04/12/16 1759   04/12/16 0544  neomycin (MYCIFRADIN) tablet 1,000 mg  Status:  Discontinued     1,000 mg Oral 3 times per day 04/12/16 0544 04/12/16 0547   04/12/16 0544  metroNIDAZOLE (FLAGYL) tablet 1,000 mg  Status:  Discontinued     1,000 mg Oral 3 times per day 04/12/16 0544 04/12/16 0547      Assessment/Plan: s/p Procedure(s): EXPLORATORY  LAPAROTOMY, REPAIR OF ANASTAMOTIC LEAK COLOSTOMY As the output from the ileostomy continues to rise, may need to add some Lomotil or Imodium to keep the output down and keep the kidneys perfused  Renal function slightly improved today  LOS: 30 days    Kathryne Eriksson. Dahlia Bailiff, MD, FACS [phone] [facility] 05/12/2016

## 2016-05-12 NOTE — Progress Notes (Signed)
Sommer MD paged regarding patient's intermittent tachycardia in the 130s-140s. Patient already on scheduled Labetalol TID. No new orders received. Will continue to monitor.    Inis Sizer

## 2016-05-13 ENCOUNTER — Inpatient Hospital Stay
Admission: AD | Admit: 2016-05-13 | Discharge: 2016-06-09 | Disposition: A | Discharge status: Skilled Nursing Facility | Payer: Self-pay | Source: Ambulatory Visit | Attending: Internal Medicine | Admitting: Internal Medicine

## 2016-05-13 ENCOUNTER — Other Ambulatory Visit: Payer: Self-pay

## 2016-05-13 ENCOUNTER — Other Ambulatory Visit (HOSPITAL_COMMUNITY): Payer: Self-pay

## 2016-05-13 DIAGNOSIS — Z85038 Personal history of other malignant neoplasm of large intestine: Secondary | ICD-10-CM | POA: Diagnosis not present

## 2016-05-13 DIAGNOSIS — E669 Obesity, unspecified: Secondary | ICD-10-CM | POA: Diagnosis present

## 2016-05-13 DIAGNOSIS — J449 Chronic obstructive pulmonary disease, unspecified: Secondary | ICD-10-CM | POA: Diagnosis present

## 2016-05-13 DIAGNOSIS — Z48815 Encounter for surgical aftercare following surgery on the digestive system: Secondary | ICD-10-CM | POA: Diagnosis not present

## 2016-05-13 DIAGNOSIS — K219 Gastro-esophageal reflux disease without esophagitis: Secondary | ICD-10-CM | POA: Diagnosis not present

## 2016-05-13 DIAGNOSIS — J9690 Respiratory failure, unspecified, unspecified whether with hypoxia or hypercapnia: Secondary | ICD-10-CM

## 2016-05-13 DIAGNOSIS — I5042 Chronic combined systolic (congestive) and diastolic (congestive) heart failure: Secondary | ICD-10-CM | POA: Diagnosis not present

## 2016-05-13 DIAGNOSIS — J9600 Acute respiratory failure, unspecified whether with hypoxia or hypercapnia: Secondary | ICD-10-CM | POA: Diagnosis not present

## 2016-05-13 DIAGNOSIS — Z4682 Encounter for fitting and adjustment of non-vascular catheter: Secondary | ICD-10-CM | POA: Diagnosis not present

## 2016-05-13 DIAGNOSIS — I472 Ventricular tachycardia: Secondary | ICD-10-CM | POA: Diagnosis not present

## 2016-05-13 DIAGNOSIS — J9621 Acute and chronic respiratory failure with hypoxia: Secondary | ICD-10-CM | POA: Diagnosis not present

## 2016-05-13 DIAGNOSIS — I2510 Atherosclerotic heart disease of native coronary artery without angina pectoris: Secondary | ICD-10-CM | POA: Diagnosis present

## 2016-05-13 DIAGNOSIS — Z87891 Personal history of nicotine dependence: Secondary | ICD-10-CM | POA: Diagnosis not present

## 2016-05-13 DIAGNOSIS — K650 Generalized (acute) peritonitis: Secondary | ICD-10-CM | POA: Diagnosis not present

## 2016-05-13 DIAGNOSIS — Z430 Encounter for attention to tracheostomy: Secondary | ICD-10-CM | POA: Diagnosis not present

## 2016-05-13 DIAGNOSIS — G4733 Obstructive sleep apnea (adult) (pediatric): Secondary | ICD-10-CM | POA: Diagnosis present

## 2016-05-13 DIAGNOSIS — Z930 Tracheostomy status: Secondary | ICD-10-CM | POA: Diagnosis not present

## 2016-05-13 DIAGNOSIS — Z433 Encounter for attention to colostomy: Secondary | ICD-10-CM | POA: Diagnosis not present

## 2016-05-13 DIAGNOSIS — N179 Acute kidney failure, unspecified: Secondary | ICD-10-CM | POA: Diagnosis not present

## 2016-05-13 DIAGNOSIS — Z6841 Body Mass Index (BMI) 40.0 and over, adult: Secondary | ICD-10-CM | POA: Diagnosis not present

## 2016-05-13 DIAGNOSIS — Z9911 Dependence on respirator [ventilator] status: Secondary | ICD-10-CM | POA: Diagnosis not present

## 2016-05-13 DIAGNOSIS — E43 Unspecified severe protein-calorie malnutrition: Secondary | ICD-10-CM | POA: Diagnosis not present

## 2016-05-13 DIAGNOSIS — Z931 Gastrostomy status: Secondary | ICD-10-CM

## 2016-05-13 DIAGNOSIS — I5030 Unspecified diastolic (congestive) heart failure: Secondary | ICD-10-CM | POA: Diagnosis not present

## 2016-05-13 DIAGNOSIS — B370 Candidal stomatitis: Secondary | ICD-10-CM | POA: Diagnosis not present

## 2016-05-13 DIAGNOSIS — N189 Chronic kidney disease, unspecified: Secondary | ICD-10-CM | POA: Diagnosis not present

## 2016-05-13 DIAGNOSIS — R531 Weakness: Secondary | ICD-10-CM | POA: Diagnosis not present

## 2016-05-13 DIAGNOSIS — J811 Chronic pulmonary edema: Secondary | ICD-10-CM

## 2016-05-13 DIAGNOSIS — J80 Acute respiratory distress syndrome: Secondary | ICD-10-CM | POA: Diagnosis not present

## 2016-05-13 DIAGNOSIS — A419 Sepsis, unspecified organism: Secondary | ICD-10-CM | POA: Diagnosis not present

## 2016-05-13 DIAGNOSIS — G9340 Encephalopathy, unspecified: Secondary | ICD-10-CM | POA: Diagnosis not present

## 2016-05-13 DIAGNOSIS — N170 Acute kidney failure with tubular necrosis: Secondary | ICD-10-CM | POA: Diagnosis not present

## 2016-05-13 DIAGNOSIS — I96 Gangrene, not elsewhere classified: Secondary | ICD-10-CM | POA: Diagnosis not present

## 2016-05-13 DIAGNOSIS — F419 Anxiety disorder, unspecified: Secondary | ICD-10-CM | POA: Diagnosis not present

## 2016-05-13 DIAGNOSIS — R6521 Severe sepsis with septic shock: Secondary | ICD-10-CM | POA: Diagnosis not present

## 2016-05-13 DIAGNOSIS — R1310 Dysphagia, unspecified: Secondary | ICD-10-CM | POA: Diagnosis present

## 2016-05-13 DIAGNOSIS — I129 Hypertensive chronic kidney disease with stage 1 through stage 4 chronic kidney disease, or unspecified chronic kidney disease: Secondary | ICD-10-CM | POA: Diagnosis not present

## 2016-05-13 DIAGNOSIS — J9622 Acute and chronic respiratory failure with hypercapnia: Secondary | ICD-10-CM | POA: Diagnosis not present

## 2016-05-13 DIAGNOSIS — N183 Chronic kidney disease, stage 3 (moderate): Secondary | ICD-10-CM | POA: Diagnosis not present

## 2016-05-13 DIAGNOSIS — I110 Hypertensive heart disease with heart failure: Secondary | ICD-10-CM | POA: Diagnosis not present

## 2016-05-13 DIAGNOSIS — R935 Abnormal findings on diagnostic imaging of other abdominal regions, including retroperitoneum: Secondary | ICD-10-CM | POA: Diagnosis not present

## 2016-05-13 DIAGNOSIS — E785 Hyperlipidemia, unspecified: Secondary | ICD-10-CM | POA: Diagnosis not present

## 2016-05-13 DIAGNOSIS — I5020 Unspecified systolic (congestive) heart failure: Secondary | ICD-10-CM | POA: Diagnosis not present

## 2016-05-13 DIAGNOSIS — J90 Pleural effusion, not elsewhere classified: Secondary | ICD-10-CM

## 2016-05-13 DIAGNOSIS — K560 Paralytic ileus: Secondary | ICD-10-CM | POA: Diagnosis not present

## 2016-05-13 DIAGNOSIS — I10 Essential (primary) hypertension: Secondary | ICD-10-CM | POA: Diagnosis not present

## 2016-05-13 DIAGNOSIS — M6281 Muscle weakness (generalized): Secondary | ICD-10-CM | POA: Diagnosis not present

## 2016-05-13 DIAGNOSIS — I509 Heart failure, unspecified: Secondary | ICD-10-CM | POA: Diagnosis not present

## 2016-05-13 DIAGNOSIS — I482 Chronic atrial fibrillation: Secondary | ICD-10-CM | POA: Diagnosis not present

## 2016-05-13 DIAGNOSIS — E46 Unspecified protein-calorie malnutrition: Secondary | ICD-10-CM | POA: Diagnosis present

## 2016-05-13 DIAGNOSIS — I471 Supraventricular tachycardia: Secondary | ICD-10-CM | POA: Diagnosis not present

## 2016-05-13 DIAGNOSIS — C189 Malignant neoplasm of colon, unspecified: Secondary | ICD-10-CM | POA: Diagnosis not present

## 2016-05-13 DIAGNOSIS — I130 Hypertensive heart and chronic kidney disease with heart failure and stage 1 through stage 4 chronic kidney disease, or unspecified chronic kidney disease: Secondary | ICD-10-CM | POA: Diagnosis not present

## 2016-05-13 LAB — BASIC METABOLIC PANEL
Anion gap: 12 (ref 5–15)
BUN: 52 mg/dL — ABNORMAL HIGH (ref 6–20)
CALCIUM: 9.4 mg/dL (ref 8.9–10.3)
CHLORIDE: 105 mmol/L (ref 101–111)
CO2: 29 mmol/L (ref 22–32)
CREATININE: 1.92 mg/dL — AB (ref 0.61–1.24)
GFR calc non Af Amer: 35 mL/min — ABNORMAL LOW (ref >60)
GFR, EST AFRICAN AMERICAN: 41 mL/min — AB (ref >60)
GLUCOSE: 124 mg/dL — AB (ref 65–99)
Potassium: 3.8 mmol/L (ref 3.5–5.1)
Sodium: 146 mmol/L — ABNORMAL HIGH (ref 135–145)

## 2016-05-13 LAB — GLUCOSE, CAPILLARY
GLUCOSE-CAPILLARY: 117 mg/dL — AB (ref 65–99)
GLUCOSE-CAPILLARY: 105 mg/dL — AB (ref 65–99)
Glucose-Capillary: 103 mg/dL — ABNORMAL HIGH (ref 65–99)

## 2016-05-13 MED ORDER — PRO-STAT SUGAR FREE PO LIQD: 60.0000 mL | Freq: Three times a day (TID) | ORAL | 0 refills | Status: DC

## 2016-05-13 MED ORDER — FENTANYL 100 MCG/HR TD PT72: 100.0000 ug | MEDICATED_PATCH | TRANSDERMAL | 0 refills | Status: DC

## 2016-05-13 MED ORDER — CLONAZEPAM 1 MG PO TABS: 1.0000 mg | ORAL_TABLET | Freq: Two times a day (BID) | ORAL | 0 refills | Status: DC

## 2016-05-13 MED ORDER — IOPAMIDOL (ISOVUE-300) INJECTION 61%
INTRAVENOUS | Status: AC
  Filled 2016-05-13: qty 50

## 2016-05-13 MED ORDER — VITAL AF 1.2 CAL PO LIQD: 1000.0000 mL | ORAL | 3 refills | Status: DC

## 2016-05-13 MED ORDER — PANTOPRAZOLE SODIUM 40 MG PO PACK: 40.0000 mg | PACK | Freq: Two times a day (BID) | ORAL | 0 refills | Status: DC

## 2016-05-13 MED ORDER — QUETIAPINE FUMARATE 100 MG PO TABS: 100.0000 mg | ORAL_TABLET | Freq: Two times a day (BID) | ORAL | 0 refills | Status: DC

## 2016-05-13 MED ORDER — ACETAMINOPHEN 160 MG/5ML PO SOLN: 650.0000 mg | Freq: Four times a day (QID) | ORAL | 0 refills | Status: DC | PRN

## 2016-05-13 NOTE — Care Management Note (Signed)
Case Management Note  Patient Details  Name: Larry [name] MRN: [id number] Date of Birth: 06-Jan-1951  Subjective/Objective:    s/p exploratory lap repair of anastomotic leak, colostomy                  Action/Plan: Discharge Planning: please see previous NCM notes Received call from [place] and pt has bed [facility] 5[facility]. Unit RN to complete [place] and [facility] today.   PCP REESE, BETTI   Expected Discharge Date:  05/13/2016              Expected Discharge Plan:  Long Term Acute Care (LTAC)  In-House Referral:  NA  Discharge planning Services  CM Consult  Post Acute Care Choice:  NA Choice offered to:  NA  DME Arranged:    DME Agency:  NA  HH Arranged:  NA HH Agency:  NA  Status of Service:  Completed, signed off  If discussed at [place] of Stay Meetings, dates discussed:    Additional Comments:  Erenest Rasher, RN 05/13/2016, 10:42 AM

## 2016-05-13 NOTE — Progress Notes (Signed)
Nestor MD paged regarding patient's sustained HR in the 130s - 140s. Patient received 50 mcg of Fentanyl for comfort. Patient currently asleep and resting. Orders received to obtain an EKG. Will do so and will continue to monitor.    Inis Sizer

## 2016-05-13 NOTE — Progress Notes (Signed)
Progress Note  Presenting HPI:  66 y.o. 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 Presented on 2/20 for reversal of colostomy , developed recurrent abdominal pain on 2/26 and taken back to the OR on 2/27 finding free air with peritonitis and anastomotic leakage. Patient returned from the operating room on ventilator and with ongoing shock.  IMAGING/STUDIES: TTE 2/24: LV normal in size with moderate LVH. EF 55-60% with no regional wall motion abnormalities.   MICROBIOLOGY: Blood Cultures x2 2/27 >>>ng Tracheal Aspirate Culture 2/28 >>> nml flora MRSA PCR 2/28:  Negative  Blood Cultures x2 3/2 >>>ng Urine Culture 3/2 >>>ng  ANTIBIOTICS: Cefotan 2/20 (periop prophylaxis) Zosyn 2/27 >>>D/C after a total of 14 days Eraxis 2/28 >>>D/C after a total of 14 days  SIGNIFICANT EVENTS: 02/20 - Admit for colostomy reversal 02/23 - Postop ileus & tachycardia 02/24 - Cardiology Consulted 02/27 - OR for Exploratory Laparotomy finding anastomotic leak & peritonitis  3/2   transitioned to Precedex infusion and  fentanyl drip, febrile  Subjective:   TC again did well, secretions remain  Vent Mode: PRVC FiO2 (%):  [40 %] 40 % Set Rate:  [14 bmp] 14 bmp Vt Set:  [580 mL] 580 mL PEEP:  [5 cmH20] 5 cmH20 Plateau Pressure:  [16 cmH20-20 cmH20] 20 cmH20  Temp:  [98.1 F (36.7 C)-100 F (37.8 C)] 99.2 F (37.3 C) (03/23 0835) Pulse Rate:  [96-150] 108 (03/23 0835) Resp:  [20-36] 20 (03/23 0835) BP: (84-159)/(57-105) 114/62 (03/23 0700) SpO2:  [88 %-99 %] 96 % (03/23 0835) FiO2 (%):  [40 %] 40 % (03/23 0835)  General: Awake and alert, following commands Neuro: Awake and following commands HEENT: trach clean PULM:Coarse improved about the same CV:  RRR, no rub, murmur or gallop, S1, S2 GI: Soft, tender to palpation, BS increasing, stoma pink, good output  Extremities: Warm and dry, trace edema gone  LINES/TUBES: OETT 7.5 2/27  >>> R IJ CVL 2/27 >>>  LUE DL PICC 2/26 >>> Foley >>> L NGT 2/27 >>> L RADIAL ART LINE 2/27 >>> discontinued PIV  CBC Latest Ref Rng & Units 05/12/2016 05/11/2016 05/10/2016  WBC 4.0 - 10.5 K/uL 10.0 9.4 10.6(H)  Hemoglobin 13.0 - 17.0 g/dL 7.9(L) 8.3(L) 8.6(L)  Hematocrit 39.0 - 52.0 % 28.5(L) 30.4(L) 30.4(L)  Platelets 150 - 400 K/uL 239 229 244   BMP Latest Ref Rng & Units 05/13/2016 05/12/2016 05/11/2016  Glucose 65 - 99 mg/dL 124(H) 192(H) 116(H)  BUN 6 - 20 mg/dL 52(H) 49(H) 51(H)  Creatinine 0.61 - 1.24 mg/dL 1.92(H) 2.03(H) 2.18(H)  Sodium 135 - 145 mmol/L 146(H) 144 148(H)  Potassium 3.5 - 5.1 mmol/L 3.8 3.7 3.9  Chloride 101 - 111 mmol/L 105 104 109  CO2 22 - 32 mmol/L _0 Calcium 8.9 - 10.3 mg/dL 9.4 9.2 9.1   Hepatic Function Latest Ref Rng & Units 05/02/2016 05/01/2016 04/28/2016  Total Protein 6.5 - 8.1 g/dL 5.7(L) 6.5 5.4(L)  Albumin 3.5 - 5.0 g/dL 1.7(L) 1.8(L) 1.5(L)  AST 15 - 41 U/L 50(H) 35 24  ALT 17 - 63 U/L 49 38 26  Alk Phosphatase 38 - 126 U/L 103 103 81  Total Bilirubin 0.3 - 1.2 mg/dL 0.5 0.7 0.5  Bilirubin, Direct <=0.2 mg/dL - - -   ASSESSMENT/PLAN:  66 y.o. male, initially admitted for reversal of ostomy, ended up with peritonitis from bowel anastomotic leak. Off pressors.  Had bowel issues post op but better.  Main issue is agitation/sedation and weaning. S/P trache on 3/13.   Acute on chronic hypoxemic and hypercarbic respiratory failure: TC to goal 12-14 hours and 2 hours more per day if able 1. Acute on chronic stage III renal failure.  Continued d5w, crt responding, chem in am  2. Hyperglycemia, monitor, SSI 3. History of coronary artery disease: ASA , Telemetry 4. HTN: increase PO labetalol PO TID, may need labetolol back to 300  Can go to ltach Await sdu for 3 days now Will see Monday, call if needed  Lavon Paganini. Titus Mould, MD, [place] Pgr: [facility] 05/13/2016 9:35 AM

## 2016-05-13 NOTE — Progress Notes (Signed)
Report given to [facility], Therapist, sports at [facility]	[place]. All questions answered. Pt transferred to [facility] and transported on [place] 40% to 5E03. VSS during transfer. Wife updated on patient's location.

## 2016-05-13 NOTE — Progress Notes (Signed)
[place] Progress Note Patient Name: Larry [name] DOB: 08-22-1950 MRN: [id number]   Date of Service  05/13/2016  HPI/Events of Note  Notified by bedside nurse the patient is sustaining heart rate around 141-142 beats per minute. Camera check shows patient sleeping with eyes closed appearing comfortable. Just received IV fentanyl. Patient denying any pain or discomfort per nurse. Currently normotensive. Appears sinus tachycardia on telemetry. Last EKG on 3/20.   eICU Interventions  1. Checking an EKG stat 2. Continuous telemetry monitoring      Intervention Category Intermediate Interventions: Arrhythmia - evaluation and management  Tera Partridge 05/13/2016, 3:40 AM

## 2016-05-13 NOTE — Progress Notes (Signed)
GS Progress Note Subjective: Patient more responsive this AM  Objective: Vital signs in last 24 hours: Temp:  [98.1 F (36.7 C)-100 F (37.8 C)] 98.1 F (36.7 C) (03/23 0357) Pulse Rate:  [96-156] 108 (03/23 0835) Resp:  [20-36] 20 (03/23 0835) BP: (84-159)/(57-105) 114/62 (03/23 0700) SpO2:  [88 %-99 %] 96 % (03/23 0835) FiO2 (%):  [40 %] 40 % (03/23 0835) Last BM Date: 05/12/16  Intake/Output from previous day: 03/22 0701 - 03/23 0700 In: 3564.2 [I.V.:1854.2; NG/GT:1710] Out: 2805 [Urine:1575; Drains:5; Stool:1225] Intake/Output this shift: No intake/output data recorded.  Lungs: Clear but lots of secretions.  Abd: Soft, ileostomy output 1200 cc yesterday.  Good bowel sounds.  BInder in place  Extremities: No changes  Neuro: Intact, more alert  Lab Results: CBC   Recent Labs  05/11/16 0854 05/12/16 0433  WBC 9.4 10.0  HGB 8.3* 7.9*  HCT 30.4* 28.5*  PLT 229 239   BMET  Recent Labs  05/12/16 0433 05/13/16 0357  NA 144 146*  K 3.7 3.8  CL 104 105  CO2 30 29  GLUCOSE 192* 124*  BUN 49* 52*  CREATININE 2.03* 1.92*  CALCIUM 9.2 9.4   PT/INR No results for input(s): LABPROT, INR in the last 72 hours. ABG No results for input(s): PHART, HCO3 in the last 72 hours.  Invalid input(s): PCO2, PO2  Studies/Results: No results found.  Anti-infectives: Anti-infectives    Start     Dose/Rate Route Frequency Ordered Stop   04/21/16 1000  anidulafungin (ERAXIS) 100 mg in sodium chloride 0.9 % 100 mL IVPB     100 mg over 90 Minutes Intravenous Every 24 hours 04/20/16 1008 05/02/16 1115   04/20/16 1045  anidulafungin (ERAXIS) 200 mg in sodium chloride 0.9 % 200 mL IVPB     200 mg over 180 Minutes Intravenous  Once 04/20/16 1008 04/20/16 1422   04/19/16 2030  piperacillin-tazobactam (ZOSYN) IVPB 3.375 g     3.375 g 12.5 mL/hr over 240 Minutes Intravenous Every 8 hours 04/19/16 1958 05/02/16 2359   04/19/16 1230  cefoTEtan in Dextrose 5% (CEFOTAN) 2-2.08  GM-% IVPB    Comments:  Ivar Drape   : cabinet override      04/19/16 1230 04/19/16 1245   04/12/16 2000  cefoTEtan (CEFOTAN) 2 g in dextrose 5 % 50 mL IVPB     2 g 100 mL/hr over 30 Minutes Intravenous Every 12 hours 04/12/16 1120 04/12/16 2125   04/12/16 0700  cefoTEtan (CEFOTAN) 2 g in dextrose 5 % 50 mL IVPB     2 g 100 mL/hr over 30 Minutes Intravenous To ShortStay Surgical 04/11/16 1404 04/12/16 0753   04/12/16 0548  cefoTEtan in Dextrose 5% (CEFOTAN) 2-2.08 GM-% IVPB    Comments:  Precious Haws   : cabinet override      04/12/16 0548 04/12/16 1759   04/12/16 0544  neomycin (MYCIFRADIN) tablet 1,000 mg  Status:  Discontinued     1,000 mg Oral 3 times per day 04/12/16 0544 04/12/16 0547   04/12/16 0544  metroNIDAZOLE (FLAGYL) tablet 1,000 mg  Status:  Discontinued     1,000 mg Oral 3 times per day 04/12/16 0544 04/12/16 0547      Assessment/Plan: s/p Procedure(s): EXPLORATORY LAPAROTOMY, REPAIR OF ANASTAMOTIC LEAK COLOSTOMY Renal function and ilesotmy output appear to be stable.  Wean to trach collar again today in spite of secretions. Possible LTACH transfer today.  LOS: 31 days    Kathryne Eriksson. Dahlia Bailiff, MD, FACS [phone] (  [facility] 05/13/2016

## 2016-05-13 NOTE — Discharge Summary (Signed)
Physician Discharge Summary  Patient ID: Larry [name] MRN: [id number] DOB/AGE: [birth year] 64 y.o.  Admit date: 04/12/2016 Discharge date: 05/13/2016  Admission Diagnoses:  Colostomy in place for reversal  Discharge Diagnoses:  Active Problems:   Essential hypertension   Coronary atherosclerosis   Obstructive sleep apnea   Tachycardia   Torsades de pointes (HCC)   Colostomy in place [facility])   AKI (acute kidney injury) ([place])   Pressure injury of skin   Acute respiratory failure ([place])   Adynamic ileus ([place])   Peritonitis (HCC)   Abdominal pain   Hypervolemia   Encounter for nasogastric tube placement   SBO (small bowel obstruction)   Acute respiratory failure with hypoxia (HCC)   Acute pulmonary edema (HCC) Anastomotic leak with peritonitis  Discharged Condition: fair  Hospital Course: Patient admitted the day of his initial surgery on February 20 for a colostomy reversal.  Unfortunately he develop an anastomotic leak on POD 7 and had to be taken back to the OR for a diverting loop ileostomy and repair of the leak.  Since then he has been recovering from the two  Operation and developed respiratory failure requiring prolonged intubation, and ultimately a tracheostomy by Dr. Nelda Marseille on 3/13.  He has done better and has weaned to tracheostomy collar, but unable to remain off the ventilator because or agitation and secretions.  He developed a postoperative ileus for a prolonged period of time and for a while he was on TPN.  He is now getting his nutrition enterally through a long intestinal tube ([place]rak).  His ileostomy is functioning well and putting out about 1200 to 1500 cc per day.  We have to keep an eye on the ileostomy output because if that become too high, it could adversely effect his renal function which seems not to have stabilized  He has had intermittent tachycardia and has been follow by Cardiology service for this.  This has stabilized well  His midline wound had  widened postoperatively with some necrosis of the fascia.  The is a dehiscence but not evisceration.  He should wear a binder most of the time.  Overall he is stable tfor transfer to [facility]  Consults: cardiology and pulmonary/intensive care  Significant Diagnostic Studies: labs: general surveillance studies, microbiology: blood culture: negative, sputum culture: negative and urine culture: negative, radiology: CXR: atelectasis bilaterally, KUB: Ileus and CT scan: Abdomen without intra-abdominal abscess or leak and bronchoscopy: during the trach placement  Treatments: IV hydration, antibiotics: ceftriaxone, analgesia: Dilaudid and hydrocodone, cardiac meds: metoprolol, furosemide and amiodarone, TPN, respiratory therapy: O2, chest PT and Xopenex, procedures: bedside trach and surgery: As previously reported  Discharge Exam: Blood pressure (!) 142/83, pulse (!) 144, temperature 99.2 F (37.3 C), resp. rate (!) 27, height 5' 10.5" (1.791 m), weight (!) 160.6 kg (354 lb), SpO2 100 %. General appearance: alert, mild distress and slowed mentation Resp: rhonchi bilaterally and clears with suctioning GI: abnormal findings:  distended, obese and fascial dehiscence and Ileostomy in the RUQ, vialble intact and drainag well Neurologic: Mental status: alertness: lethargic, orientation: person  Disposition: 01-Home or Self Care   Allergies as of 05/13/2016      Reactions   No Known Allergies       Medication List    STOP taking these medications   sildenafil 100 MG tablet Commonly known as:  VIAGRA   Simethicone 125 MG Tabs   spironolactone 25 MG tablet Commonly known as:  ALDACTONE   verapamil 240 MG CR tablet Commonly  known as:  CALAN-SR     TAKE these medications   acetaminophen 160 MG/5ML solution Commonly known as:  TYLENOL Place 20.3 mLs (650 mg total) into feeding tube every 6 (six) hours as needed for fever (Temp >  100.5 F.).   aspirin EC 81 MG tablet Take 1 tablet (81 mg  total) by mouth daily.   atorvastatin 40 MG tablet Commonly known as:  LIPITOR TAKE 1 TABLET BY MOUTH DAILY   clonazePAM 1 MG tablet Commonly known as:  KLONOPIN Place 1 tablet (1 mg total) into feeding tube 2 (two) times daily.   clopidogrel 75 MG tablet Commonly known as:  PLAVIX TAKE 1 TABLET (75 MG TOTAL) BY MOUTH DAILY WITH BREAKFAST.   feeding supplement (PRO-STAT SUGAR FREE 64) Liqd Place 60 mLs into feeding tube 3 (three) times daily.   feeding supplement (VITAL AF 1.2 CAL) Liqd Place 1,000 mLs into feeding tube continuous.   fentaNYL 100 MCG/HR Commonly known as:  DURAGESIC - dosed mcg/hr Place 1 patch (100 mcg total) onto the skin every 3 (three) days. Start taking on:  05/15/2016   ferrous sulfate 325 (65 FE) MG tablet Take 1 tablet (325 mg total) by mouth daily with breakfast.   furosemide 40 MG tablet Commonly known as:  LASIX Take 1 tablet (40 mg total) by mouth daily.   levalbuterol 45 MCG/ACT inhaler Commonly known as:  XOPENEX HFA INHALE 2 PUFFS INTO THE LUNGS EVERY 4 (FOUR) HOURS AS NEEDED FOR WHEEZING.   Magnesium 500 MG Caps Take 500 mg by mouth daily.   metoprolol tartrate 25 MG tablet Commonly known as:  LOPRESSOR Take 1.5 tablets (37.5 mg total) by mouth 2 (two) times daily.   multivitamin with minerals tablet Take 1 tablet by mouth daily.   nitroGLYCERIN 0.4 MG SL tablet Commonly known as:  NITROSTAT Place 0.4 mg under the tongue every 5 (five) minutes as needed for chest pain.   pantoprazole sodium 40 mg/20 mL Pack Commonly known as:  PROTONIX Place 20 mLs (40 mg total) into feeding tube 2 (two) times daily.   potassium chloride SA 20 MEQ tablet Commonly known as:  KLOR-CON M20 Take 1 tablet (20 mEq total) by mouth 2 (two) times daily.   QUEtiapine 100 MG tablet Commonly known as:  SEROQUEL Place 1 tablet (100 mg total) into feeding tube 2 (two) times daily.      Follow-up Information    Judeth Horn, MD Follow up.   Specialty:   General Surgery Why:  Come to see me two weeks after he has gotten home from [facility] information: [place] [id number] [phone]           Signed: Judeth Horn 05/13/2016, 11:26 AM

## 2016-05-13 NOTE — Progress Notes (Signed)
Report given to [facility] RT.  All questions answered.

## 2016-05-14 LAB — CBC WITH DIFFERENTIAL/PLATELET
Basophils Absolute: 0 10*3/uL (ref 0.0–0.1)
Basophils Relative: 0 %
Eosinophils Absolute: 0.3 10*3/uL (ref 0.0–0.7)
Eosinophils Relative: 3 %
HCT: 29.4 % — ABNORMAL LOW (ref 39.0–52.0)
Hemoglobin: 8.3 g/dL — ABNORMAL LOW (ref 13.0–17.0)
LYMPHS ABS: 1.2 10*3/uL (ref 0.7–4.0)
LYMPHS PCT: 12 %
MCH: 26.2 pg (ref 26.0–34.0)
MCHC: 28.2 g/dL — AB (ref 30.0–36.0)
MCV: 92.7 fL (ref 78.0–100.0)
MONO ABS: 1 10*3/uL (ref 0.1–1.0)
Monocytes Relative: 10 %
Neutro Abs: 7.6 10*3/uL (ref 1.7–7.7)
Neutrophils Relative %: 75 %
PLATELETS: 202 10*3/uL (ref 150–400)
RBC: 3.17 MIL/uL — ABNORMAL LOW (ref 4.22–5.81)
RDW: 16.2 % — ABNORMAL HIGH (ref 11.5–15.5)
WBC: 10.1 10*3/uL (ref 4.0–10.5)

## 2016-05-14 LAB — COMPREHENSIVE METABOLIC PANEL
ALBUMIN: 2 g/dL — AB (ref 3.5–5.0)
ALK PHOS: 154 U/L — AB (ref 38–126)
ALT: 66 U/L — ABNORMAL HIGH (ref 17–63)
ANION GAP: 10 (ref 5–15)
AST: 45 U/L — AB (ref 15–41)
BILIRUBIN TOTAL: 0.9 mg/dL (ref 0.3–1.2)
BUN: 47 mg/dL — AB (ref 6–20)
CALCIUM: 9.8 mg/dL (ref 8.9–10.3)
CO2: 29 mmol/L (ref 22–32)
Chloride: 109 mmol/L (ref 101–111)
Creatinine, Ser: 1.74 mg/dL — ABNORMAL HIGH (ref 0.61–1.24)
GFR calc Af Amer: 46 mL/min — ABNORMAL LOW (ref >60)
GFR, EST NON AFRICAN AMERICAN: 40 mL/min — AB (ref >60)
GLUCOSE: 117 mg/dL — AB (ref 65–99)
Potassium: 4.1 mmol/L (ref 3.5–5.1)
Sodium: 148 mmol/L — ABNORMAL HIGH (ref 135–145)
TOTAL PROTEIN: 7.2 g/dL (ref 6.5–8.1)

## 2016-05-14 LAB — URINALYSIS, ROUTINE W REFLEX MICROSCOPIC
BACTERIA UA: NONE SEEN
Bilirubin Urine: NEGATIVE
Glucose, UA: NEGATIVE mg/dL
Ketones, ur: NEGATIVE mg/dL
Nitrite: POSITIVE — AB
PROTEIN: 30 mg/dL — AB
Specific Gravity, Urine: 1.012 (ref 1.005–1.030)
pH: 6 (ref 5.0–8.0)

## 2016-05-14 LAB — PROTIME-INR
INR: 1.12
Prothrombin Time: 14.5 seconds (ref 11.4–15.2)

## 2016-05-14 LAB — PHOSPHORUS: Phosphorus: 3.5 mg/dL (ref 2.5–4.6)

## 2016-05-14 LAB — TSH: TSH: 1.08 u[IU]/mL (ref 0.350–4.500)

## 2016-05-14 LAB — MAGNESIUM: Magnesium: 2.3 mg/dL (ref 1.7–2.4)

## 2016-05-15 LAB — URINE CULTURE

## 2016-05-15 LAB — HEMOGLOBIN A1C
HEMOGLOBIN A1C: 5.9 % — AB (ref 4.8–5.6)
MEAN PLASMA GLUCOSE: 123 mg/dL

## 2016-05-18 LAB — CBC WITH DIFFERENTIAL/PLATELET
BASOS ABS: 0 10*3/uL (ref 0.0–0.1)
Basophils Relative: 0 %
EOS ABS: 0.5 10*3/uL (ref 0.0–0.7)
Eosinophils Relative: 5 %
HCT: 29.7 % — ABNORMAL LOW (ref 39.0–52.0)
Hemoglobin: 8.6 g/dL — ABNORMAL LOW (ref 13.0–17.0)
LYMPHS ABS: 1.6 10*3/uL (ref 0.7–4.0)
Lymphocytes Relative: 16 %
MCH: 26.4 pg (ref 26.0–34.0)
MCHC: 29 g/dL — AB (ref 30.0–36.0)
MCV: 91.1 fL (ref 78.0–100.0)
MONOS PCT: 8 %
Monocytes Absolute: 0.8 10*3/uL (ref 0.1–1.0)
NEUTROS ABS: 6.8 10*3/uL (ref 1.7–7.7)
Neutrophils Relative %: 71 %
PLATELETS: 263 10*3/uL (ref 150–400)
RBC: 3.26 MIL/uL — ABNORMAL LOW (ref 4.22–5.81)
RDW: 16.2 % — AB (ref 11.5–15.5)
WBC: 9.7 10*3/uL (ref 4.0–10.5)

## 2016-05-18 LAB — BASIC METABOLIC PANEL
ANION GAP: 9 (ref 5–15)
BUN: 42 mg/dL — ABNORMAL HIGH (ref 6–20)
CALCIUM: 9.8 mg/dL (ref 8.9–10.3)
CO2: 27 mmol/L (ref 22–32)
CREATININE: 1.54 mg/dL — AB (ref 0.61–1.24)
Chloride: 113 mmol/L — ABNORMAL HIGH (ref 101–111)
GFR calc Af Amer: 54 mL/min — ABNORMAL LOW (ref >60)
GFR, EST NON AFRICAN AMERICAN: 46 mL/min — AB (ref >60)
Glucose, Bld: 122 mg/dL — ABNORMAL HIGH (ref 65–99)
Potassium: 4.1 mmol/L (ref 3.5–5.1)
Sodium: 149 mmol/L — ABNORMAL HIGH (ref 135–145)

## 2016-05-18 LAB — URINALYSIS, ROUTINE W REFLEX MICROSCOPIC
Bilirubin Urine: NEGATIVE
Glucose, UA: NEGATIVE mg/dL
Hgb urine dipstick: NEGATIVE
Ketones, ur: NEGATIVE mg/dL
LEUKOCYTES UA: NEGATIVE
Nitrite: NEGATIVE
Protein, ur: 100 mg/dL — AB
SPECIFIC GRAVITY, URINE: 1.015 (ref 1.005–1.030)
pH: 9 — ABNORMAL HIGH (ref 5.0–8.0)

## 2016-05-19 LAB — BASIC METABOLIC PANEL
Anion gap: 10 (ref 5–15)
BUN: 40 mg/dL — ABNORMAL HIGH (ref 6–20)
CALCIUM: 9.7 mg/dL (ref 8.9–10.3)
CO2: 27 mmol/L (ref 22–32)
Chloride: 110 mmol/L (ref 101–111)
Creatinine, Ser: 1.48 mg/dL — ABNORMAL HIGH (ref 0.61–1.24)
GFR calc Af Amer: 56 mL/min — ABNORMAL LOW (ref >60)
GFR, EST NON AFRICAN AMERICAN: 49 mL/min — AB (ref >60)
Glucose, Bld: 117 mg/dL — ABNORMAL HIGH (ref 65–99)
POTASSIUM: 4 mmol/L (ref 3.5–5.1)
SODIUM: 147 mmol/L — AB (ref 135–145)

## 2016-05-19 LAB — URINE CULTURE: Culture: NO GROWTH

## 2016-05-19 LAB — PHOSPHORUS: Phosphorus: 4.1 mg/dL (ref 2.5–4.6)

## 2016-05-19 LAB — MAGNESIUM: MAGNESIUM: 2.3 mg/dL (ref 1.7–2.4)

## 2016-05-20 LAB — BASIC METABOLIC PANEL
Anion gap: 9 (ref 5–15)
BUN: 38 mg/dL — AB (ref 6–20)
CHLORIDE: 109 mmol/L (ref 101–111)
CO2: 25 mmol/L (ref 22–32)
Calcium: 10 mg/dL (ref 8.9–10.3)
Creatinine, Ser: 1.46 mg/dL — ABNORMAL HIGH (ref 0.61–1.24)
GFR calc Af Amer: 57 mL/min — ABNORMAL LOW (ref >60)
GFR calc non Af Amer: 49 mL/min — ABNORMAL LOW (ref >60)
GLUCOSE: 114 mg/dL — AB (ref 65–99)
Potassium: 3.9 mmol/L (ref 3.5–5.1)
Sodium: 143 mmol/L (ref 135–145)

## 2016-05-23 ENCOUNTER — Other Ambulatory Visit (HOSPITAL_COMMUNITY): Payer: Self-pay

## 2016-05-23 DIAGNOSIS — R935 Abnormal findings on diagnostic imaging of other abdominal regions, including retroperitoneum: Secondary | ICD-10-CM | POA: Diagnosis not present

## 2016-05-23 LAB — C DIFFICILE QUICK SCREEN W PCR REFLEX
C Diff antigen: NEGATIVE
C Diff interpretation: NOT DETECTED
C Diff toxin: NEGATIVE

## 2016-05-24 ENCOUNTER — Other Ambulatory Visit (HOSPITAL_COMMUNITY): Payer: Self-pay

## 2016-05-24 DIAGNOSIS — J811 Chronic pulmonary edema: Secondary | ICD-10-CM | POA: Diagnosis not present

## 2016-05-25 LAB — BASIC METABOLIC PANEL
ANION GAP: 12 (ref 5–15)
BUN: 42 mg/dL — ABNORMAL HIGH (ref 6–20)
CHLORIDE: 105 mmol/L (ref 101–111)
CO2: 24 mmol/L (ref 22–32)
Calcium: 10.5 mg/dL — ABNORMAL HIGH (ref 8.9–10.3)
Creatinine, Ser: 1.43 mg/dL — ABNORMAL HIGH (ref 0.61–1.24)
GFR calc non Af Amer: 51 mL/min — ABNORMAL LOW (ref >60)
GFR, EST AFRICAN AMERICAN: 59 mL/min — AB (ref >60)
GLUCOSE: 115 mg/dL — AB (ref 65–99)
POTASSIUM: 4.2 mmol/L (ref 3.5–5.1)
Sodium: 141 mmol/L (ref 135–145)

## 2016-05-25 LAB — CBC WITH DIFFERENTIAL/PLATELET
Basophils Absolute: 0 10*3/uL (ref 0.0–0.1)
Basophils Relative: 0 %
EOS ABS: 0.3 10*3/uL (ref 0.0–0.7)
Eosinophils Relative: 3 %
HEMATOCRIT: 29.5 % — AB (ref 39.0–52.0)
HEMOGLOBIN: 8.9 g/dL — AB (ref 13.0–17.0)
LYMPHS PCT: 13 %
Lymphs Abs: 1.3 10*3/uL (ref 0.7–4.0)
MCH: 27 pg (ref 26.0–34.0)
MCHC: 30.2 g/dL (ref 30.0–36.0)
MCV: 89.4 fL (ref 78.0–100.0)
MONOS PCT: 9 %
Monocytes Absolute: 0.9 10*3/uL (ref 0.1–1.0)
NEUTROS ABS: 7.6 10*3/uL (ref 1.7–7.7)
NEUTROS PCT: 75 %
Platelets: 341 10*3/uL (ref 150–400)
RBC: 3.3 MIL/uL — AB (ref 4.22–5.81)
RDW: 15.9 % — ABNORMAL HIGH (ref 11.5–15.5)
WBC: 10.1 10*3/uL (ref 4.0–10.5)

## 2016-05-25 LAB — MAGNESIUM: Magnesium: 2.2 mg/dL (ref 1.7–2.4)

## 2016-05-25 LAB — PHOSPHORUS: PHOSPHORUS: 4.5 mg/dL (ref 2.5–4.6)

## 2016-05-26 ENCOUNTER — Other Ambulatory Visit (HOSPITAL_COMMUNITY): Payer: Self-pay

## 2016-05-26 DIAGNOSIS — J9690 Respiratory failure, unspecified, unspecified whether with hypoxia or hypercapnia: Secondary | ICD-10-CM | POA: Diagnosis not present

## 2016-05-27 LAB — CBC
HCT: 30.7 % — ABNORMAL LOW (ref 39.0–52.0)
HEMOGLOBIN: 9 g/dL — AB (ref 13.0–17.0)
MCH: 26.3 pg (ref 26.0–34.0)
MCHC: 29.3 g/dL — AB (ref 30.0–36.0)
MCV: 89.8 fL (ref 78.0–100.0)
Platelets: 289 10*3/uL (ref 150–400)
RBC: 3.42 MIL/uL — ABNORMAL LOW (ref 4.22–5.81)
RDW: 16.2 % — AB (ref 11.5–15.5)
WBC: 7.7 10*3/uL (ref 4.0–10.5)

## 2016-05-27 LAB — RENAL FUNCTION PANEL
ALBUMIN: 3.1 g/dL — AB (ref 3.5–5.0)
ANION GAP: 14 (ref 5–15)
BUN: 54 mg/dL — AB (ref 6–20)
CALCIUM: 10.7 mg/dL — AB (ref 8.9–10.3)
CO2: 22 mmol/L (ref 22–32)
Chloride: 104 mmol/L (ref 101–111)
Creatinine, Ser: 1.96 mg/dL — ABNORMAL HIGH (ref 0.61–1.24)
GFR calc Af Amer: 40 mL/min — ABNORMAL LOW (ref >60)
GFR calc non Af Amer: 35 mL/min — ABNORMAL LOW (ref >60)
GLUCOSE: 122 mg/dL — AB (ref 65–99)
Phosphorus: 5.2 mg/dL — ABNORMAL HIGH (ref 2.5–4.6)
Potassium: 4.1 mmol/L (ref 3.5–5.1)
SODIUM: 140 mmol/L (ref 135–145)

## 2016-05-27 LAB — MAGNESIUM: Magnesium: 2.2 mg/dL (ref 1.7–2.4)

## 2016-05-28 LAB — RENAL FUNCTION PANEL
ALBUMIN: 3.1 g/dL — AB (ref 3.5–5.0)
Anion gap: 13 (ref 5–15)
BUN: 53 mg/dL — AB (ref 6–20)
CALCIUM: 10.9 mg/dL — AB (ref 8.9–10.3)
CO2: 27 mmol/L (ref 22–32)
CREATININE: 1.89 mg/dL — AB (ref 0.61–1.24)
Chloride: 102 mmol/L (ref 101–111)
GFR calc Af Amer: 42 mL/min — ABNORMAL LOW (ref >60)
GFR, EST NON AFRICAN AMERICAN: 36 mL/min — AB (ref >60)
Glucose, Bld: 121 mg/dL — ABNORMAL HIGH (ref 65–99)
PHOSPHORUS: 4.1 mg/dL (ref 2.5–4.6)
Potassium: 3.5 mmol/L (ref 3.5–5.1)
Sodium: 142 mmol/L (ref 135–145)

## 2016-05-28 LAB — MAGNESIUM: MAGNESIUM: 2.4 mg/dL (ref 1.7–2.4)

## 2016-06-02 LAB — CBC
HEMATOCRIT: 30.6 % — AB (ref 39.0–52.0)
Hemoglobin: 9.3 g/dL — ABNORMAL LOW (ref 13.0–17.0)
MCH: 26.8 pg (ref 26.0–34.0)
MCHC: 30.4 g/dL (ref 30.0–36.0)
MCV: 88.2 fL (ref 78.0–100.0)
Platelets: 203 10*3/uL (ref 150–400)
RBC: 3.47 MIL/uL — AB (ref 4.22–5.81)
RDW: 16.8 % — AB (ref 11.5–15.5)
WBC: 7.3 10*3/uL (ref 4.0–10.5)

## 2016-06-02 LAB — BASIC METABOLIC PANEL
Anion gap: 12 (ref 5–15)
BUN: 60 mg/dL — AB (ref 6–20)
CHLORIDE: 103 mmol/L (ref 101–111)
CO2: 22 mmol/L (ref 22–32)
Calcium: 10.1 mg/dL (ref 8.9–10.3)
Creatinine, Ser: 2.06 mg/dL — ABNORMAL HIGH (ref 0.61–1.24)
GFR calc Af Amer: 38 mL/min — ABNORMAL LOW (ref >60)
GFR calc non Af Amer: 33 mL/min — ABNORMAL LOW (ref >60)
Glucose, Bld: 105 mg/dL — ABNORMAL HIGH (ref 65–99)
POTASSIUM: 3.4 mmol/L — AB (ref 3.5–5.1)
SODIUM: 137 mmol/L (ref 135–145)

## 2016-06-04 LAB — BASIC METABOLIC PANEL
ANION GAP: 11 (ref 5–15)
BUN: 45 mg/dL — ABNORMAL HIGH (ref 6–20)
CALCIUM: 10.1 mg/dL (ref 8.9–10.3)
CO2: 21 mmol/L — ABNORMAL LOW (ref 22–32)
Chloride: 103 mmol/L (ref 101–111)
Creatinine, Ser: 1.81 mg/dL — ABNORMAL HIGH (ref 0.61–1.24)
GFR, EST AFRICAN AMERICAN: 44 mL/min — AB (ref >60)
GFR, EST NON AFRICAN AMERICAN: 38 mL/min — AB (ref >60)
Glucose, Bld: 106 mg/dL — ABNORMAL HIGH (ref 65–99)
Potassium: 3.7 mmol/L (ref 3.5–5.1)
SODIUM: 135 mmol/L (ref 135–145)

## 2016-06-08 ENCOUNTER — Other Ambulatory Visit (HOSPITAL_COMMUNITY): Payer: Self-pay

## 2016-06-08 LAB — CBC
HCT: 30 % — ABNORMAL LOW (ref 39.0–52.0)
Hemoglobin: 9.2 g/dL — ABNORMAL LOW (ref 13.0–17.0)
MCH: 26.7 pg (ref 26.0–34.0)
MCHC: 30.7 g/dL (ref 30.0–36.0)
MCV: 87.2 fL (ref 78.0–100.0)
PLATELETS: 218 10*3/uL (ref 150–400)
RBC: 3.44 MIL/uL — ABNORMAL LOW (ref 4.22–5.81)
RDW: 17.4 % — AB (ref 11.5–15.5)
WBC: 7 10*3/uL (ref 4.0–10.5)

## 2016-06-08 LAB — RENAL FUNCTION PANEL
ALBUMIN: 3.3 g/dL — AB (ref 3.5–5.0)
Anion gap: 13 (ref 5–15)
BUN: 26 mg/dL — AB (ref 6–20)
CO2: 19 mmol/L — AB (ref 22–32)
Calcium: 10.4 mg/dL — ABNORMAL HIGH (ref 8.9–10.3)
Chloride: 103 mmol/L (ref 101–111)
Creatinine, Ser: 1.74 mg/dL — ABNORMAL HIGH (ref 0.61–1.24)
GFR calc Af Amer: 46 mL/min — ABNORMAL LOW (ref >60)
GFR calc non Af Amer: 40 mL/min — ABNORMAL LOW (ref >60)
GLUCOSE: 92 mg/dL (ref 65–99)
POTASSIUM: 4.1 mmol/L (ref 3.5–5.1)
Phosphorus: 4.2 mg/dL (ref 2.5–4.6)
SODIUM: 135 mmol/L (ref 135–145)

## 2016-06-08 LAB — MAGNESIUM: Magnesium: 2 mg/dL (ref 1.7–2.4)

## 2016-06-09 DIAGNOSIS — R309 Painful micturition, unspecified: Secondary | ICD-10-CM | POA: Diagnosis not present

## 2016-06-09 DIAGNOSIS — E785 Hyperlipidemia, unspecified: Secondary | ICD-10-CM | POA: Diagnosis not present

## 2016-06-09 DIAGNOSIS — R110 Nausea: Secondary | ICD-10-CM | POA: Diagnosis not present

## 2016-06-09 DIAGNOSIS — N179 Acute kidney failure, unspecified: Secondary | ICD-10-CM | POA: Diagnosis not present

## 2016-06-09 DIAGNOSIS — J9600 Acute respiratory failure, unspecified whether with hypoxia or hypercapnia: Secondary | ICD-10-CM | POA: Diagnosis not present

## 2016-06-09 DIAGNOSIS — I10 Essential (primary) hypertension: Secondary | ICD-10-CM | POA: Diagnosis not present

## 2016-06-09 DIAGNOSIS — I2510 Atherosclerotic heart disease of native coronary artery without angina pectoris: Secondary | ICD-10-CM | POA: Diagnosis not present

## 2016-06-09 DIAGNOSIS — I130 Hypertensive heart and chronic kidney disease with heart failure and stage 1 through stage 4 chronic kidney disease, or unspecified chronic kidney disease: Secondary | ICD-10-CM | POA: Diagnosis not present

## 2016-06-09 DIAGNOSIS — R112 Nausea with vomiting, unspecified: Secondary | ICD-10-CM | POA: Diagnosis not present

## 2016-06-09 DIAGNOSIS — R630 Anorexia: Secondary | ICD-10-CM | POA: Diagnosis not present

## 2016-06-09 DIAGNOSIS — E43 Unspecified severe protein-calorie malnutrition: Secondary | ICD-10-CM | POA: Diagnosis not present

## 2016-06-09 DIAGNOSIS — L98499 Non-pressure chronic ulcer of skin of other sites with unspecified severity: Secondary | ICD-10-CM | POA: Diagnosis not present

## 2016-06-09 DIAGNOSIS — Z85038 Personal history of other malignant neoplasm of large intestine: Secondary | ICD-10-CM | POA: Diagnosis not present

## 2016-06-09 DIAGNOSIS — R509 Fever, unspecified: Secondary | ICD-10-CM | POA: Diagnosis not present

## 2016-06-09 DIAGNOSIS — I471 Supraventricular tachycardia: Secondary | ICD-10-CM | POA: Diagnosis not present

## 2016-06-09 DIAGNOSIS — N189 Chronic kidney disease, unspecified: Secondary | ICD-10-CM | POA: Diagnosis not present

## 2016-06-09 DIAGNOSIS — J9690 Respiratory failure, unspecified, unspecified whether with hypoxia or hypercapnia: Secondary | ICD-10-CM | POA: Diagnosis not present

## 2016-06-09 DIAGNOSIS — R1084 Generalized abdominal pain: Secondary | ICD-10-CM | POA: Diagnosis not present

## 2016-06-09 DIAGNOSIS — I5020 Unspecified systolic (congestive) heart failure: Secondary | ICD-10-CM | POA: Diagnosis not present

## 2016-06-09 DIAGNOSIS — M6281 Muscle weakness (generalized): Secondary | ICD-10-CM | POA: Diagnosis not present

## 2016-06-09 DIAGNOSIS — N390 Urinary tract infection, site not specified: Secondary | ICD-10-CM | POA: Diagnosis not present

## 2016-06-09 DIAGNOSIS — G4733 Obstructive sleep apnea (adult) (pediatric): Secondary | ICD-10-CM | POA: Diagnosis not present

## 2016-06-09 DIAGNOSIS — J80 Acute respiratory distress syndrome: Secondary | ICD-10-CM | POA: Diagnosis not present

## 2016-06-09 DIAGNOSIS — K219 Gastro-esophageal reflux disease without esophagitis: Secondary | ICD-10-CM | POA: Diagnosis not present

## 2016-06-09 DIAGNOSIS — F05 Delirium due to known physiological condition: Secondary | ICD-10-CM | POA: Diagnosis not present

## 2016-06-09 DIAGNOSIS — F339 Major depressive disorder, recurrent, unspecified: Secondary | ICD-10-CM | POA: Diagnosis not present

## 2016-06-09 DIAGNOSIS — R5381 Other malaise: Secondary | ICD-10-CM | POA: Diagnosis not present

## 2016-06-09 DIAGNOSIS — J449 Chronic obstructive pulmonary disease, unspecified: Secondary | ICD-10-CM | POA: Diagnosis not present

## 2016-06-20 DIAGNOSIS — M6281 Muscle weakness (generalized): Secondary | ICD-10-CM | POA: Diagnosis not present

## 2016-06-20 DIAGNOSIS — F339 Major depressive disorder, recurrent, unspecified: Secondary | ICD-10-CM | POA: Diagnosis not present

## 2016-06-20 DIAGNOSIS — R112 Nausea with vomiting, unspecified: Secondary | ICD-10-CM | POA: Diagnosis not present

## 2016-06-23 DIAGNOSIS — L98499 Non-pressure chronic ulcer of skin of other sites with unspecified severity: Secondary | ICD-10-CM | POA: Diagnosis not present

## 2016-06-24 DIAGNOSIS — R5381 Other malaise: Secondary | ICD-10-CM | POA: Diagnosis not present

## 2016-06-24 DIAGNOSIS — R509 Fever, unspecified: Secondary | ICD-10-CM | POA: Diagnosis not present

## 2016-06-24 DIAGNOSIS — I471 Supraventricular tachycardia: Secondary | ICD-10-CM | POA: Diagnosis not present

## 2016-06-24 DIAGNOSIS — M6281 Muscle weakness (generalized): Secondary | ICD-10-CM | POA: Diagnosis not present

## 2016-06-28 DIAGNOSIS — M6281 Muscle weakness (generalized): Secondary | ICD-10-CM | POA: Diagnosis not present

## 2016-06-28 DIAGNOSIS — R110 Nausea: Secondary | ICD-10-CM | POA: Diagnosis not present

## 2016-06-28 DIAGNOSIS — N179 Acute kidney failure, unspecified: Secondary | ICD-10-CM | POA: Diagnosis not present

## 2016-06-28 DIAGNOSIS — R5381 Other malaise: Secondary | ICD-10-CM | POA: Diagnosis not present

## 2016-07-01 DIAGNOSIS — L98499 Non-pressure chronic ulcer of skin of other sites with unspecified severity: Secondary | ICD-10-CM | POA: Diagnosis not present

## 2016-07-02 ENCOUNTER — Other Ambulatory Visit: Payer: Self-pay | Admitting: Cardiovascular Disease

## 2016-07-04 DIAGNOSIS — R112 Nausea with vomiting, unspecified: Secondary | ICD-10-CM | POA: Diagnosis not present

## 2016-07-04 DIAGNOSIS — R630 Anorexia: Secondary | ICD-10-CM | POA: Diagnosis not present

## 2016-07-04 DIAGNOSIS — R1084 Generalized abdominal pain: Secondary | ICD-10-CM | POA: Diagnosis not present

## 2016-07-07 DIAGNOSIS — L98499 Non-pressure chronic ulcer of skin of other sites with unspecified severity: Secondary | ICD-10-CM | POA: Diagnosis not present

## 2016-07-14 DIAGNOSIS — N390 Urinary tract infection, site not specified: Secondary | ICD-10-CM | POA: Diagnosis not present

## 2016-07-14 DIAGNOSIS — L98499 Non-pressure chronic ulcer of skin of other sites with unspecified severity: Secondary | ICD-10-CM | POA: Diagnosis not present

## 2016-07-14 DIAGNOSIS — F05 Delirium due to known physiological condition: Secondary | ICD-10-CM | POA: Diagnosis not present

## 2016-07-14 DIAGNOSIS — R309 Painful micturition, unspecified: Secondary | ICD-10-CM | POA: Diagnosis not present

## 2016-07-21 DIAGNOSIS — I5020 Unspecified systolic (congestive) heart failure: Secondary | ICD-10-CM | POA: Diagnosis not present

## 2016-07-21 DIAGNOSIS — M6281 Muscle weakness (generalized): Secondary | ICD-10-CM | POA: Diagnosis not present

## 2016-07-21 DIAGNOSIS — J449 Chronic obstructive pulmonary disease, unspecified: Secondary | ICD-10-CM | POA: Diagnosis not present

## 2016-07-21 DIAGNOSIS — L98499 Non-pressure chronic ulcer of skin of other sites with unspecified severity: Secondary | ICD-10-CM | POA: Diagnosis not present

## 2016-07-21 DIAGNOSIS — I10 Essential (primary) hypertension: Secondary | ICD-10-CM | POA: Diagnosis not present

## 2016-07-23 DIAGNOSIS — Z7902 Long term (current) use of antithrombotics/antiplatelets: Secondary | ICD-10-CM | POA: Diagnosis not present

## 2016-07-23 DIAGNOSIS — Z48815 Encounter for surgical aftercare following surgery on the digestive system: Secondary | ICD-10-CM | POA: Diagnosis not present

## 2016-07-23 DIAGNOSIS — Z430 Encounter for attention to tracheostomy: Secondary | ICD-10-CM | POA: Diagnosis not present

## 2016-07-23 DIAGNOSIS — N183 Chronic kidney disease, stage 3 (moderate): Secondary | ICD-10-CM | POA: Diagnosis not present

## 2016-07-23 DIAGNOSIS — Z9989 Dependence on other enabling machines and devices: Secondary | ICD-10-CM | POA: Diagnosis not present

## 2016-07-23 DIAGNOSIS — Z9981 Dependence on supplemental oxygen: Secondary | ICD-10-CM | POA: Diagnosis not present

## 2016-07-23 DIAGNOSIS — J449 Chronic obstructive pulmonary disease, unspecified: Secondary | ICD-10-CM | POA: Diagnosis not present

## 2016-07-23 DIAGNOSIS — E669 Obesity, unspecified: Secondary | ICD-10-CM | POA: Diagnosis not present

## 2016-07-23 DIAGNOSIS — Z85038 Personal history of other malignant neoplasm of large intestine: Secondary | ICD-10-CM | POA: Diagnosis not present

## 2016-07-23 DIAGNOSIS — Z6841 Body Mass Index (BMI) 40.0 and over, adult: Secondary | ICD-10-CM | POA: Diagnosis not present

## 2016-07-23 DIAGNOSIS — I739 Peripheral vascular disease, unspecified: Secondary | ICD-10-CM | POA: Diagnosis not present

## 2016-07-23 DIAGNOSIS — I5042 Chronic combined systolic (congestive) and diastolic (congestive) heart failure: Secondary | ICD-10-CM | POA: Diagnosis not present

## 2016-07-23 DIAGNOSIS — Z433 Encounter for attention to colostomy: Secondary | ICD-10-CM | POA: Diagnosis not present

## 2016-07-23 DIAGNOSIS — I130 Hypertensive heart and chronic kidney disease with heart failure and stage 1 through stage 4 chronic kidney disease, or unspecified chronic kidney disease: Secondary | ICD-10-CM | POA: Diagnosis not present

## 2016-07-23 DIAGNOSIS — I4891 Unspecified atrial fibrillation: Secondary | ICD-10-CM | POA: Diagnosis not present

## 2016-07-23 DIAGNOSIS — I2510 Atherosclerotic heart disease of native coronary artery without angina pectoris: Secondary | ICD-10-CM | POA: Diagnosis not present

## 2016-07-25 DIAGNOSIS — Z48815 Encounter for surgical aftercare following surgery on the digestive system: Secondary | ICD-10-CM | POA: Diagnosis not present

## 2016-07-25 DIAGNOSIS — I5042 Chronic combined systolic (congestive) and diastolic (congestive) heart failure: Secondary | ICD-10-CM | POA: Diagnosis not present

## 2016-07-25 DIAGNOSIS — I2510 Atherosclerotic heart disease of native coronary artery without angina pectoris: Secondary | ICD-10-CM | POA: Diagnosis not present

## 2016-07-25 DIAGNOSIS — Z433 Encounter for attention to colostomy: Secondary | ICD-10-CM | POA: Diagnosis not present

## 2016-07-25 DIAGNOSIS — Z430 Encounter for attention to tracheostomy: Secondary | ICD-10-CM | POA: Diagnosis not present

## 2016-07-25 DIAGNOSIS — I130 Hypertensive heart and chronic kidney disease with heart failure and stage 1 through stage 4 chronic kidney disease, or unspecified chronic kidney disease: Secondary | ICD-10-CM | POA: Diagnosis not present

## 2016-07-26 DIAGNOSIS — Z430 Encounter for attention to tracheostomy: Secondary | ICD-10-CM | POA: Diagnosis not present

## 2016-07-26 DIAGNOSIS — Z48815 Encounter for surgical aftercare following surgery on the digestive system: Secondary | ICD-10-CM | POA: Diagnosis not present

## 2016-07-26 DIAGNOSIS — Z433 Encounter for attention to colostomy: Secondary | ICD-10-CM | POA: Diagnosis not present

## 2016-07-26 DIAGNOSIS — I5042 Chronic combined systolic (congestive) and diastolic (congestive) heart failure: Secondary | ICD-10-CM | POA: Diagnosis not present

## 2016-07-26 DIAGNOSIS — I2510 Atherosclerotic heart disease of native coronary artery without angina pectoris: Secondary | ICD-10-CM | POA: Diagnosis not present

## 2016-07-26 DIAGNOSIS — I130 Hypertensive heart and chronic kidney disease with heart failure and stage 1 through stage 4 chronic kidney disease, or unspecified chronic kidney disease: Secondary | ICD-10-CM | POA: Diagnosis not present

## 2016-07-27 DIAGNOSIS — I130 Hypertensive heart and chronic kidney disease with heart failure and stage 1 through stage 4 chronic kidney disease, or unspecified chronic kidney disease: Secondary | ICD-10-CM | POA: Diagnosis not present

## 2016-07-27 DIAGNOSIS — I2510 Atherosclerotic heart disease of native coronary artery without angina pectoris: Secondary | ICD-10-CM | POA: Diagnosis not present

## 2016-07-27 DIAGNOSIS — Z430 Encounter for attention to tracheostomy: Secondary | ICD-10-CM | POA: Diagnosis not present

## 2016-07-27 DIAGNOSIS — I5042 Chronic combined systolic (congestive) and diastolic (congestive) heart failure: Secondary | ICD-10-CM | POA: Diagnosis not present

## 2016-07-27 DIAGNOSIS — Z433 Encounter for attention to colostomy: Secondary | ICD-10-CM | POA: Diagnosis not present

## 2016-07-27 DIAGNOSIS — Z48815 Encounter for surgical aftercare following surgery on the digestive system: Secondary | ICD-10-CM | POA: Diagnosis not present

## 2016-07-28 DIAGNOSIS — I5042 Chronic combined systolic (congestive) and diastolic (congestive) heart failure: Secondary | ICD-10-CM | POA: Diagnosis not present

## 2016-07-28 DIAGNOSIS — I2510 Atherosclerotic heart disease of native coronary artery without angina pectoris: Secondary | ICD-10-CM | POA: Diagnosis not present

## 2016-07-28 DIAGNOSIS — I130 Hypertensive heart and chronic kidney disease with heart failure and stage 1 through stage 4 chronic kidney disease, or unspecified chronic kidney disease: Secondary | ICD-10-CM | POA: Diagnosis not present

## 2016-07-28 DIAGNOSIS — Z433 Encounter for attention to colostomy: Secondary | ICD-10-CM | POA: Diagnosis not present

## 2016-07-28 DIAGNOSIS — Z48815 Encounter for surgical aftercare following surgery on the digestive system: Secondary | ICD-10-CM | POA: Diagnosis not present

## 2016-07-28 DIAGNOSIS — Z430 Encounter for attention to tracheostomy: Secondary | ICD-10-CM | POA: Diagnosis not present

## 2016-07-30 DIAGNOSIS — I2510 Atherosclerotic heart disease of native coronary artery without angina pectoris: Secondary | ICD-10-CM | POA: Diagnosis not present

## 2016-07-30 DIAGNOSIS — Z430 Encounter for attention to tracheostomy: Secondary | ICD-10-CM | POA: Diagnosis not present

## 2016-07-30 DIAGNOSIS — I5042 Chronic combined systolic (congestive) and diastolic (congestive) heart failure: Secondary | ICD-10-CM | POA: Diagnosis not present

## 2016-07-30 DIAGNOSIS — I130 Hypertensive heart and chronic kidney disease with heart failure and stage 1 through stage 4 chronic kidney disease, or unspecified chronic kidney disease: Secondary | ICD-10-CM | POA: Diagnosis not present

## 2016-07-30 DIAGNOSIS — Z48815 Encounter for surgical aftercare following surgery on the digestive system: Secondary | ICD-10-CM | POA: Diagnosis not present

## 2016-07-30 DIAGNOSIS — Z433 Encounter for attention to colostomy: Secondary | ICD-10-CM | POA: Diagnosis not present

## 2016-08-01 ENCOUNTER — Encounter: Payer: Self-pay | Admitting: Family

## 2016-08-01 DIAGNOSIS — I2510 Atherosclerotic heart disease of native coronary artery without angina pectoris: Secondary | ICD-10-CM | POA: Diagnosis not present

## 2016-08-01 DIAGNOSIS — Z48815 Encounter for surgical aftercare following surgery on the digestive system: Secondary | ICD-10-CM | POA: Diagnosis not present

## 2016-08-01 DIAGNOSIS — I5042 Chronic combined systolic (congestive) and diastolic (congestive) heart failure: Secondary | ICD-10-CM | POA: Diagnosis not present

## 2016-08-01 DIAGNOSIS — Z433 Encounter for attention to colostomy: Secondary | ICD-10-CM | POA: Diagnosis not present

## 2016-08-01 DIAGNOSIS — I130 Hypertensive heart and chronic kidney disease with heart failure and stage 1 through stage 4 chronic kidney disease, or unspecified chronic kidney disease: Secondary | ICD-10-CM | POA: Diagnosis not present

## 2016-08-01 DIAGNOSIS — Z430 Encounter for attention to tracheostomy: Secondary | ICD-10-CM | POA: Diagnosis not present

## 2016-08-02 DIAGNOSIS — Z430 Encounter for attention to tracheostomy: Secondary | ICD-10-CM | POA: Diagnosis not present

## 2016-08-02 DIAGNOSIS — I130 Hypertensive heart and chronic kidney disease with heart failure and stage 1 through stage 4 chronic kidney disease, or unspecified chronic kidney disease: Secondary | ICD-10-CM | POA: Diagnosis not present

## 2016-08-02 DIAGNOSIS — I5042 Chronic combined systolic (congestive) and diastolic (congestive) heart failure: Secondary | ICD-10-CM | POA: Diagnosis not present

## 2016-08-02 DIAGNOSIS — I2510 Atherosclerotic heart disease of native coronary artery without angina pectoris: Secondary | ICD-10-CM | POA: Diagnosis not present

## 2016-08-02 DIAGNOSIS — Z48815 Encounter for surgical aftercare following surgery on the digestive system: Secondary | ICD-10-CM | POA: Diagnosis not present

## 2016-08-02 DIAGNOSIS — Z433 Encounter for attention to colostomy: Secondary | ICD-10-CM | POA: Diagnosis not present

## 2016-08-03 DIAGNOSIS — Z430 Encounter for attention to tracheostomy: Secondary | ICD-10-CM | POA: Diagnosis not present

## 2016-08-03 DIAGNOSIS — I5042 Chronic combined systolic (congestive) and diastolic (congestive) heart failure: Secondary | ICD-10-CM | POA: Diagnosis not present

## 2016-08-03 DIAGNOSIS — Z48815 Encounter for surgical aftercare following surgery on the digestive system: Secondary | ICD-10-CM | POA: Diagnosis not present

## 2016-08-03 DIAGNOSIS — Z433 Encounter for attention to colostomy: Secondary | ICD-10-CM | POA: Diagnosis not present

## 2016-08-03 DIAGNOSIS — I130 Hypertensive heart and chronic kidney disease with heart failure and stage 1 through stage 4 chronic kidney disease, or unspecified chronic kidney disease: Secondary | ICD-10-CM | POA: Diagnosis not present

## 2016-08-03 DIAGNOSIS — I2510 Atherosclerotic heart disease of native coronary artery without angina pectoris: Secondary | ICD-10-CM | POA: Diagnosis not present

## 2016-08-04 DIAGNOSIS — I5042 Chronic combined systolic (congestive) and diastolic (congestive) heart failure: Secondary | ICD-10-CM | POA: Diagnosis not present

## 2016-08-04 DIAGNOSIS — I130 Hypertensive heart and chronic kidney disease with heart failure and stage 1 through stage 4 chronic kidney disease, or unspecified chronic kidney disease: Secondary | ICD-10-CM | POA: Diagnosis not present

## 2016-08-04 DIAGNOSIS — I2510 Atherosclerotic heart disease of native coronary artery without angina pectoris: Secondary | ICD-10-CM | POA: Diagnosis not present

## 2016-08-04 DIAGNOSIS — Z430 Encounter for attention to tracheostomy: Secondary | ICD-10-CM | POA: Diagnosis not present

## 2016-08-04 DIAGNOSIS — Z433 Encounter for attention to colostomy: Secondary | ICD-10-CM | POA: Diagnosis not present

## 2016-08-04 DIAGNOSIS — Z48815 Encounter for surgical aftercare following surgery on the digestive system: Secondary | ICD-10-CM | POA: Diagnosis not present

## 2016-08-05 DIAGNOSIS — I2510 Atherosclerotic heart disease of native coronary artery without angina pectoris: Secondary | ICD-10-CM | POA: Diagnosis not present

## 2016-08-05 DIAGNOSIS — I130 Hypertensive heart and chronic kidney disease with heart failure and stage 1 through stage 4 chronic kidney disease, or unspecified chronic kidney disease: Secondary | ICD-10-CM | POA: Diagnosis not present

## 2016-08-05 DIAGNOSIS — Z433 Encounter for attention to colostomy: Secondary | ICD-10-CM | POA: Diagnosis not present

## 2016-08-05 DIAGNOSIS — Z48815 Encounter for surgical aftercare following surgery on the digestive system: Secondary | ICD-10-CM | POA: Diagnosis not present

## 2016-08-05 DIAGNOSIS — I5042 Chronic combined systolic (congestive) and diastolic (congestive) heart failure: Secondary | ICD-10-CM | POA: Diagnosis not present

## 2016-08-05 DIAGNOSIS — Z430 Encounter for attention to tracheostomy: Secondary | ICD-10-CM | POA: Diagnosis not present

## 2016-08-08 ENCOUNTER — Encounter: Payer: Self-pay | Admitting: Genetics

## 2016-08-08 DIAGNOSIS — Z430 Encounter for attention to tracheostomy: Secondary | ICD-10-CM | POA: Diagnosis not present

## 2016-08-08 DIAGNOSIS — I130 Hypertensive heart and chronic kidney disease with heart failure and stage 1 through stage 4 chronic kidney disease, or unspecified chronic kidney disease: Secondary | ICD-10-CM | POA: Diagnosis not present

## 2016-08-08 DIAGNOSIS — Z433 Encounter for attention to colostomy: Secondary | ICD-10-CM | POA: Diagnosis not present

## 2016-08-08 DIAGNOSIS — Z1379 Encounter for other screening for genetic and chromosomal anomalies: Secondary | ICD-10-CM | POA: Insufficient documentation

## 2016-08-08 DIAGNOSIS — I2510 Atherosclerotic heart disease of native coronary artery without angina pectoris: Secondary | ICD-10-CM | POA: Diagnosis not present

## 2016-08-08 DIAGNOSIS — I5042 Chronic combined systolic (congestive) and diastolic (congestive) heart failure: Secondary | ICD-10-CM | POA: Diagnosis not present

## 2016-08-08 DIAGNOSIS — Z48815 Encounter for surgical aftercare following surgery on the digestive system: Secondary | ICD-10-CM | POA: Diagnosis not present

## 2016-08-08 NOTE — Telephone Encounter (Signed)
Amendment: The date of this amended report is July 26, 2016.  Based on the ACMG standards and guidelines for the interpretation of sequence variants ([place] 2015) that utilizes a combination of sources, e.g., internal data, published literature, population databases and in silico models, the PMS2 c.2350G>A ([id number]) VUS has been reclassified to Likely Benign.

## 2016-08-09 DIAGNOSIS — Z433 Encounter for attention to colostomy: Secondary | ICD-10-CM | POA: Diagnosis not present

## 2016-08-09 DIAGNOSIS — Z48815 Encounter for surgical aftercare following surgery on the digestive system: Secondary | ICD-10-CM | POA: Diagnosis not present

## 2016-08-09 DIAGNOSIS — I2510 Atherosclerotic heart disease of native coronary artery without angina pectoris: Secondary | ICD-10-CM | POA: Diagnosis not present

## 2016-08-09 DIAGNOSIS — I130 Hypertensive heart and chronic kidney disease with heart failure and stage 1 through stage 4 chronic kidney disease, or unspecified chronic kidney disease: Secondary | ICD-10-CM | POA: Diagnosis not present

## 2016-08-09 DIAGNOSIS — I5042 Chronic combined systolic (congestive) and diastolic (congestive) heart failure: Secondary | ICD-10-CM | POA: Diagnosis not present

## 2016-08-09 DIAGNOSIS — Z430 Encounter for attention to tracheostomy: Secondary | ICD-10-CM | POA: Diagnosis not present

## 2016-08-10 DIAGNOSIS — I5042 Chronic combined systolic (congestive) and diastolic (congestive) heart failure: Secondary | ICD-10-CM | POA: Diagnosis not present

## 2016-08-10 DIAGNOSIS — I2510 Atherosclerotic heart disease of native coronary artery without angina pectoris: Secondary | ICD-10-CM | POA: Diagnosis not present

## 2016-08-10 DIAGNOSIS — I130 Hypertensive heart and chronic kidney disease with heart failure and stage 1 through stage 4 chronic kidney disease, or unspecified chronic kidney disease: Secondary | ICD-10-CM | POA: Diagnosis not present

## 2016-08-10 DIAGNOSIS — Z48815 Encounter for surgical aftercare following surgery on the digestive system: Secondary | ICD-10-CM | POA: Diagnosis not present

## 2016-08-10 DIAGNOSIS — Z433 Encounter for attention to colostomy: Secondary | ICD-10-CM | POA: Diagnosis not present

## 2016-08-10 DIAGNOSIS — Z430 Encounter for attention to tracheostomy: Secondary | ICD-10-CM | POA: Diagnosis not present

## 2016-08-11 DIAGNOSIS — I5042 Chronic combined systolic (congestive) and diastolic (congestive) heart failure: Secondary | ICD-10-CM | POA: Diagnosis not present

## 2016-08-11 DIAGNOSIS — Z430 Encounter for attention to tracheostomy: Secondary | ICD-10-CM | POA: Diagnosis not present

## 2016-08-11 DIAGNOSIS — Z48815 Encounter for surgical aftercare following surgery on the digestive system: Secondary | ICD-10-CM | POA: Diagnosis not present

## 2016-08-11 DIAGNOSIS — I2510 Atherosclerotic heart disease of native coronary artery without angina pectoris: Secondary | ICD-10-CM | POA: Diagnosis not present

## 2016-08-11 DIAGNOSIS — I130 Hypertensive heart and chronic kidney disease with heart failure and stage 1 through stage 4 chronic kidney disease, or unspecified chronic kidney disease: Secondary | ICD-10-CM | POA: Diagnosis not present

## 2016-08-11 DIAGNOSIS — Z433 Encounter for attention to colostomy: Secondary | ICD-10-CM | POA: Diagnosis not present

## 2016-08-12 ENCOUNTER — Encounter: Payer: Self-pay | Admitting: Family

## 2016-08-12 ENCOUNTER — Ambulatory Visit (HOSPITAL_COMMUNITY)
Admission: RE | Admit: 2016-08-12 | Discharge: 2016-08-12 | Disposition: A | Discharge status: Home / Self Care | Payer: Medicare Other | Source: Ambulatory Visit | Attending: Vascular Surgery | Admitting: Vascular Surgery

## 2016-08-12 ENCOUNTER — Ambulatory Visit (INDEPENDENT_AMBULATORY_CARE_PROVIDER_SITE_OTHER): Payer: Medicare Other | Admitting: Family

## 2016-08-12 VITALS — BP 132/79 | HR 91 | Temp 98.3°F | Resp 20 | Ht 70.5 in | Wt 304.0 lb

## 2016-08-12 DIAGNOSIS — I2510 Atherosclerotic heart disease of native coronary artery without angina pectoris: Secondary | ICD-10-CM | POA: Diagnosis not present

## 2016-08-12 DIAGNOSIS — Z87891 Personal history of nicotine dependence: Secondary | ICD-10-CM

## 2016-08-12 DIAGNOSIS — Z48815 Encounter for surgical aftercare following surgery on the digestive system: Secondary | ICD-10-CM | POA: Diagnosis not present

## 2016-08-12 DIAGNOSIS — I872 Venous insufficiency (chronic) (peripheral): Secondary | ICD-10-CM | POA: Diagnosis not present

## 2016-08-12 DIAGNOSIS — Z433 Encounter for attention to colostomy: Secondary | ICD-10-CM | POA: Diagnosis not present

## 2016-08-12 DIAGNOSIS — Z430 Encounter for attention to tracheostomy: Secondary | ICD-10-CM | POA: Diagnosis not present

## 2016-08-12 DIAGNOSIS — I130 Hypertensive heart and chronic kidney disease with heart failure and stage 1 through stage 4 chronic kidney disease, or unspecified chronic kidney disease: Secondary | ICD-10-CM | POA: Diagnosis not present

## 2016-08-12 DIAGNOSIS — I779 Disorder of arteries and arterioles, unspecified: Secondary | ICD-10-CM

## 2016-08-12 DIAGNOSIS — I5042 Chronic combined systolic (congestive) and diastolic (congestive) heart failure: Secondary | ICD-10-CM | POA: Diagnosis not present

## 2016-08-12 NOTE — Progress Notes (Signed)
[facility] [place]   CC: Follow up peripheral artery occlusive disease  History of Present Illness Larry [name] is a 66 y.o. male patient of Dr. Bridgett Larsson who returns for follow up of his PAD and and history of chronic venous stasis ulcer on both lower legs which have healed. The wounds were located on the lateral aspect of both lower legs, above the lateral malleoli.  He denies any other non-healing wounds. He denies any pain in his legs or feet.  He has been elevating his legs most of the time when he is not walking and he notes that the swelling in his lower legs has decreased a great deal.  He was being treated at the wound care center at [facility]. The patient reports onset of leg swelling following his heart stents in 2010. The patient is not diabetic. He is able to ambulate without assistance but does not ambulate much by choice. He lives at home with his wife. He denies any history of claudication (he does not ambulate enough to elicit claudication however) or rest pain.   He has a complex past medical history including ischemic cardiomyopathy with severe three vessel disease s/p stents to the RCA in 2010 by Dr. Angelena Form, combined systolic and diastolic HF, colon cancer s/p partial colectomy with colostomy, atrial tachycardia, history of cardiac arrest, obstructive sleep apnea, now on CPAP, CKD. hypertension and hyperlipidemia.  He is a former smoker.   He was hospitalized in February 2018 for colostomy reversal which developed a leak at the anastomosis after he was home. He then was hospitalized at [facility] for respiratory failure, pulmonary edema, and pleural effusion, had a trach for a while. He has an ileostomy.  He is receiving home physical therapy since hospitalized and rehab.   His bilateral lower legs ulcers healed with 6 weeks of weekly Hydrogel dressing changes at the wound care center near [facility]. He wears compression hose sometimes,  thinks the hose are too tight though.  Pt Diabetic: No Pt smoker: former smoker, quit in 2010, started at age 58 years  Pt meds include: Statin :Yes Betablocker: Yes ASA: Yes Other anticoagulants/antiplatelets: Plavix    Past Medical History:  Diagnosis Date  . Arthritis   . Atrial tachycardia ([place])    managed on beta blocker therapy  . Childhood asthma    "went away after I was 16"  . Chronic combined systolic and diastolic CHF, NYHA class 3 (HCC)    has diastolic heart failure grade 1; EF is 45 to 50% per echo 05/2011; EF 41% by Myoview 2016  . CKD (chronic kidney disease) stage 3, GFR 30-59 ml/min   . Colon cancer ([place]) 2015   MSI high; IHC loss of MLH1 and PMS2; BRAF negative; Negative methylation  . COPD (chronic obstructive pulmonary disease) ([place])   . Coronary artery disease   . Family history of ovarian cancer   . Hypercholesteremia   . Hypertension   . Noncompliance   . NSVT (nonsustained ventricular tachycardia) (HCC)    beta blocker restarted  . Obesity   . OSA on CPAP    used nightly, pt does not know settings  . Peripheral arterial disease (HCC)    4.2 cm thoracic aortic aneurysm per chest ct 11-14-15 epic  . S/P colostomy ([place])    2014  . Thrombocytopenia ([place])   . Torsades de pointes ([place])    X 2 episodes during hospital visit 12'14"electrolyte imbalance"- "Shocked"    Social  History Social History  Substance Use Topics  . Smoking status: Former Smoker    Packs/day: 1.00    Years: 38.00    Types: Cigarettes    Quit date: 07/15/2008  . Smokeless tobacco: Never Used  . Alcohol use 0.0 oz/week     Comment: none snice colostomy placed    Family History Family History  Problem Relation Age of Onset  . Hypertension Father   . Heart disease Father        before age 85  . Diabetes Father   . Hypertension Mother   . Dementia Mother   . Parkinsonism Mother   . Liver cancer Maternal Grandmother        dx in her 20s  . Prostate cancer Maternal  Uncle   . Leukemia Maternal Uncle     Past Surgical History:  Procedure Laterality Date  . COLON SURGERY    . COLONOSCOPY N/A 02/08/2013   Procedure: COLONOSCOPY;  Surgeon: [name], MD;  Location: [place];  Service: Endoscopy;  Laterality: N/A;  . COLONOSCOPY WITH PROPOFOL N/A 05/09/2014   Procedure: COLONOSCOPY WITH PROPOFOL;  Surgeon: [name], MD;  Location: [facility];  Service: Endoscopy;  Laterality: N/A;  . COLONOSCOPY WITH PROPOFOL N/A 01/08/2016   Procedure: COLONOSCOPY WITH PROPOFOL;  Surgeon: [name], MD;  Location: [facility];  Service: Endoscopy;  Laterality: N/A;  . COLOSTOMY N/A 04/19/2016   Procedure: COLOSTOMY;  Surgeon: Judeth Horn, MD;  Location: [place];  Service: General;  Laterality: N/A;  . COLOSTOMY REVERSAL  04/12/2016  . COLOSTOMY REVERSAL N/A 04/12/2016   Procedure: COLOSTOMY REVERSAL;  Surgeon: Judeth Horn, MD;  Location: [place];  Service: General;  Laterality: N/A;  . CORONARY ANGIOPLASTY WITH STENT PLACEMENT  11/12/2008; 06/11/2014   stent x 2 to RCA; stent x 2 to LAD  . CORONARY ANGIOPLASTY WITH STENT PLACEMENT  06/11/2014   m-LAD 3.5 x 16 mm Synergy DES, d-LAD  2.25 x 16 mm Synergy DES  . ESOPHAGOGASTRODUODENOSCOPY N/A 02/08/2013   Procedure: ESOPHAGOGASTRODUODENOSCOPY (EGD);  Surgeon: [name], MD;  Location: [facility];  Service: Endoscopy;  Laterality: N/A;  . FLEXIBLE SIGMOIDOSCOPY N/A 02/19/2016   Procedure: FLEXIBLE SIGMOIDOSCOPY;  Surgeon: [name], MD;  Location: [facility];  Service: Endoscopy;  Laterality: N/A;  . LAPAROTOMY N/A 02/12/2013   Procedure: EXPLORATORY LAPAROTOMY PARTIAL COLECTOMY WITH COLOSTOMY;  Surgeon: [name], MD;  Location: [place];  Service: General;  Laterality: N/A;  . LAPAROTOMY N/A 02/18/2013   Procedure: EXPLORATORY LAPAROTOMY/Closure of Wound;  Surgeon: [name], MD;  Location: [place];  Service: General;  Laterality: N/A;  . LAPAROTOMY N/A 04/19/2016   Procedure: EXPLORATORY  LAPAROTOMY, REPAIR OF ANASTAMOTIC LEAK;  Surgeon: Judeth Horn, MD;  Location: [place];  Service: General;  Laterality: N/A;  . LEFT HEART CATHETERIZATION WITH CORONARY ANGIOGRAM N/A 06/11/2014   Procedure: LEFT HEART CATHETERIZATION WITH CORONARY ANGIOGRAM;  Surgeon: [name], MD; CFX calcified, 30-40 percent, RCA calcified, 40/50/40%, PDA diffuse disease, LAD 40/75/90% s/p DES 2     Allergies  Allergen Reactions  . No Known Allergies     Current Outpatient Prescriptions  Medication Sig Dispense Refill  . aspirin EC 81 MG tablet Take 1 tablet (81 mg total) by mouth daily. 30 tablet 0  . atorvastatin (LIPITOR) 40 MG tablet TAKE 1 TABLET BY MOUTH DAILY 90 tablet 3  . clopidogrel (PLAVIX) 75 MG tablet TAKE 1 TABLET (75 MG TOTAL) BY MOUTH DAILY WITH BREAKFAST. 90 tablet 3  .  ferrous sulfate 325 (65 FE) MG tablet Take 1 tablet (325 mg total) by mouth daily with breakfast. 30 tablet 1  . furosemide (LASIX) 40 MG tablet Take 1 tablet (40 mg total) by mouth daily. 90 tablet 3  . levalbuterol (XOPENEX HFA) 45 MCG/ACT inhaler INHALE 2 PUFFS INTO THE LUNGS EVERY 4 (FOUR) HOURS AS NEEDED FOR WHEEZING. 15 Inhaler 3  . metoprolol tartrate (LOPRESSOR) 25 MG tablet Take 1.5 tablets (37.5 mg total) by mouth 2 (two) times daily. 90 tablet 6  . Multiple Vitamins-Minerals (MULTIVITAMIN WITH MINERALS) tablet Take 1 tablet by mouth daily.    . nitroGLYCERIN (NITROSTAT) 0.4 MG SL tablet Place 0.4 mg under the tongue every 5 (five) minutes as needed for chest pain.     No current facility-administered medications for this visit.     ROS: See HPI for pertinent positives and negatives.   Physical Examination  Vitals:   08/12/16 1038 08/12/16 1041  BP: 126/75 132/79  Pulse: 91   Resp: 20   Temp: 98.3 F (36.8 C)   TempSrc: Oral   SpO2: 97%   Weight: (!) 304 lb (137.9 kg)   Height: 5' 10.5" (1.791 m)    Body mass index is 43 kg/m.  General: A&O x 3, WD morbidly obese male in NAD, seated in  w/c  Head: [place]/AT  Pulmonary: Distant breath sounds due to habitus, respirations are non labored, no rales, rhonchi, or wheezing detected.  Cardiac: Distant heart sounds due to habitus, RRR, no detected murmur, no carotid bruits.   Vascular: Pulse exam difficult due to habitus.  Vessel Right Left  Radial Palpable Palpable  Aorta Not palpable N/A  Femoral Not palpable(large panus) Not palpable(large panus)  Popliteal Not palpable Not palpable  PT Not palpable Not palpable  DP Not palpable Not palpable   Gastrointestinal: soft, non tender, large panus, ilioostomy appliance to right/mid abdomen.   Musculoskeletal: M/S 5/5 throughout. Extremities without ischemic changes. Venous stasis ulcers of both lower legs have healed. No cellulitis, no drainage, no odor. Venous stasis changes to lower legs bilaterally. No wounds on feet. 3+ non pitting edema in both legs and feet, large scales of skin on feet in the process of sloughing.   Neurologic: CN 2-12 intact. Pain and light touch intact in extremities. Motor exam as listed above  Psychiatric: Judgment intact, Mood & affect appropriate for pt's clinical situation  Dermatologic: See M/S exam for extremity exam, no rashes otherwise noted     ASSESSMENT: Larry [name] is a 66 y.o. male has bilateral chronic venous stasis; the venous ulcers on his lower legs have healed with treatment at the wound care center at [facility]. He has been adequately elevating his legs to minimize edema, and the edema in his lower legs is significantly less than previous visit. He has no non healing wounds nor signs of ischemia in his feet/legs.  He does have venous stasis changes in the skin (leathery) of his lower legs and feet.  He has multiple medical problems. He was hospitalized in February though April of 2018 with respiratory failure, pulmonary edema, and pleural effusion after reversal of colostomy  developed a leak at the anastomosis; he now has an ileostomy.    His atherosclerotic risk factors include CAD, CKD, former smoker, and extreme morbid obesity.  Fortunately he does not have DM.  He takes a daily ASA, statin, and Plavix.   DATA  1.74 serum creatinine on 06-08-16.    ABI (Date: 08/12/2016)  R:  ABI: 1.57 (was 1.33 on 01-29-16),   PT: bi  DP: bi  TBI:  0.69 (was 0.64)  L:   ABI: 1.52 (was 1.14),   PT: bi  DP: tri  TBI: 0.68 (was 0.65) Bilateral ABI are not reliable, are supra systolic, likely due to medial calcification of vessels. However, waveform morphology bilaterally are bi and triphasic, bilateral TBI are almost normal.   PLAN:  Continue daily leg exercises.  Continue elevation of feet above his heart as much as possible when he is not walking. Based on the patient's vascular studies and examination, pt will return to clinic in 1 year with ABI's. I advised him to notify us if he develops concerns re the circulation in his legs/feet.     I discussed in depth with the patient the nature of atherosclerosis, and emphasized the importance of maximal medical management including strict control of blood pressure, blood glucose, and lipid levels, obtaining regular exercise, and continued cessation of smoking.  The patient is aware that without maximal medical management the underlying atherosclerotic disease process will progress, limiting the benefit of any interventions.  The patient was given information about PAD including signs, symptoms, treatment, what symptoms should prompt the patient to seek immediate medical care, and risk reduction measures to take.  Clemon Chambers, RN, MSN, FNP-C [facility] Phone: [phone]  Clinic MD: Donzetta Matters  08/12/16 11:40 AM

## 2016-08-12 NOTE — Patient Instructions (Signed)
Peripheral Vascular Disease Peripheral vascular disease (PVD) is a disease of the blood vessels that are not part of your heart and brain. A simple term for PVD is poor circulation. In most cases, PVD narrows the blood vessels that carry blood from your heart to the rest of your body. This can result in a decreased supply of blood to your arms, legs, and internal organs, like your stomach or kidneys. However, it most often affects a person's lower legs and feet. There are two types of PVD.  Organic PVD. This is the more common type. It is caused by damage to the structure of blood vessels.  Functional PVD. This is caused by conditions that make blood vessels contract and tighten (spasm).  Without treatment, PVD tends to get worse over time. PVD can also lead to acute ischemic limb. This is when an arm or limb suddenly has trouble getting enough blood. This is a medical emergency. Follow these instructions at home:  Take medicines only as told by your doctor.  Do not use any tobacco products, including cigarettes, chewing tobacco, or electronic cigarettes. If you need help quitting, ask your doctor.  Lose weight if you are overweight, and maintain a healthy weight as told by your doctor.  Eat a diet that is low in fat and cholesterol. If you need help, ask your doctor.  Exercise regularly. Ask your doctor for some good activities for you.  Take good care of your feet. ? Wear comfortable shoes that fit well. ? Check your feet often for any cuts or sores. Contact a doctor if:  You have cramps in your legs while walking.  You have leg pain when you are at rest.  You have coldness in a leg or foot.  Your skin changes.  You are unable to get or have an erection (erectile dysfunction).  You have cuts or sores on your feet that are not healing. Get help right away if:  Your arm or leg turns cold and blue.  Your arms or legs become red, warm, swollen, painful, or numb.  You have  chest pain or trouble breathing.  You suddenly have weakness in your face, arm, or leg.  You become very confused or you cannot speak.  You suddenly have a very bad headache.  You suddenly cannot see. This information is not intended to replace advice given to you by your health care provider. Make sure you discuss any questions you have with your health care provider. Document Released: 05/04/2009 Document Revised: 07/16/2015 Document Reviewed: 07/18/2013 Elsevier Interactive Patient Education  2017 Elsevier Inc.      Chronic Venous Insufficiency Chronic venous insufficiency, also called venous stasis, is a condition that prevents blood from being pumped effectively through the veins in your legs. Blood may no longer be pumped effectively from the legs back to the heart. This condition can range from mild to severe. With proper treatment, you should be able to continue with an active life. What are the causes? Chronic venous insufficiency occurs when the vein walls become stretched, weakened, or damaged, or when valves within the vein are damaged. Some common causes of this include:  High blood pressure inside the veins (venous hypertension).  Increased blood pressure in the leg veins from long periods of sitting or standing.  A blood clot that blocks blood flow in a vein (deep vein thrombosis, DVT).  Inflammation of a vein (phlebitis) that causes a blood clot to form.  Tumors in the pelvis that cause blood   to back up.  What increases the risk? The following factors may make you more likely to develop this condition:  Having a family history of this condition.  Obesity.  Pregnancy.  Living without enough physical activity or exercise (sedentary lifestyle).  Smoking.  Having a job that requires long periods of standing or sitting in one place.  Being a certain age. Women in their 53s and 62s and men in their 32s are more likely to develop this condition.  What are  the signs or symptoms? Symptoms of this condition include:  Veins that are enlarged, bulging, or twisted (varicose veins).  Skin breakdown or ulcers.  Reddened or discolored skin on the front of the leg.  Brown, smooth, tight, and painful skin just above the ankle, usually on the inside of the leg (lipodermatosclerosis).  Swelling.  How is this diagnosed? This condition may be diagnosed based on:  Your medical history.  A physical exam.  Tests, such as: ? A procedure that creates an image of a blood vessel and nearby organs and provides information about blood flow through the blood vessel (duplex ultrasound). ? A procedure that tests blood flow (plethysmography). ? A procedure to look at the veins using X-ray and dye (venogram).  How is this treated? The goals of treatment are to help you return to an active life and to minimize pain or disability. Treatment depends on the severity of your condition, and it may include:  Wearing compression stockings. These can help relieve symptoms and help prevent your condition from getting worse. However, they do not cure the condition.  Sclerotherapy. This is a procedure involving an injection of a material that "dissolves" damaged veins.  Surgery. This may involve: ? Removing a diseased vein (vein stripping). ? Cutting off blood flow through the vein (laser ablation surgery). ? Repairing a valve.  Follow these instructions at home:  Wear compression stockings as told by your health care provider. These stockings help to prevent blood clots and reduce swelling in your legs.  Take over-the-counter and prescription medicines only as told by your health care provider.  Stay active by exercising, walking, or doing different activities. Ask your health care provider what activities are safe for you and how much exercise you need.  Drink enough fluid to keep your urine clear or pale yellow.  Do not use any products that contain nicotine  or tobacco, such as cigarettes and e-cigarettes. If you need help quitting, ask your health care provider.  Keep all follow-up visits as told by your health care provider. This is important. Contact a health care provider if:  You have redness, swelling, or more pain in the affected area.  You see a red streak or line that extends up or down from the affected area.  You have skin breakdown or a loss of skin in the affected area, even if the breakdown is small.  You get an injury in the affected area. Get help right away if:  You get an injury and an open wound in the affected area.  You have severe pain that does not get better with medicine.  You have sudden numbness or weakness in the foot or ankle below the affected area, or you have trouble moving your foot or ankle.  You have a fever and you have worse or persistent symptoms.  You have chest pain.  You have shortness of breath. Summary  Chronic venous insufficiency, also called venous stasis, is a condition that prevents blood from being  pumped effectively through the veins in your legs.  Chronic venous insufficiency occurs when the vein walls become stretched, weakened, or damaged, or when valves within the vein are damaged.  Treatment for this condition depends on how severe your condition is, and it may involve wearing compression stockings or having a procedure.  Make sure you stay active by exercising, walking, or doing different activities. Ask your health care provider what activities are safe for you and how much exercise you need. This information is not intended to replace advice given to you by your health care provider. Make sure you discuss any questions you have with your health care provider. Document Released: 06/13/2006 Document Revised: 12/28/2015 Document Reviewed: 12/28/2015 Elsevier Interactive Patient Education  2017 Elsevier Inc.  

## 2016-08-14 DIAGNOSIS — I2510 Atherosclerotic heart disease of native coronary artery without angina pectoris: Secondary | ICD-10-CM | POA: Diagnosis not present

## 2016-08-14 DIAGNOSIS — Z433 Encounter for attention to colostomy: Secondary | ICD-10-CM | POA: Diagnosis not present

## 2016-08-14 DIAGNOSIS — Z430 Encounter for attention to tracheostomy: Secondary | ICD-10-CM | POA: Diagnosis not present

## 2016-08-14 DIAGNOSIS — Z48815 Encounter for surgical aftercare following surgery on the digestive system: Secondary | ICD-10-CM | POA: Diagnosis not present

## 2016-08-14 DIAGNOSIS — I130 Hypertensive heart and chronic kidney disease with heart failure and stage 1 through stage 4 chronic kidney disease, or unspecified chronic kidney disease: Secondary | ICD-10-CM | POA: Diagnosis not present

## 2016-08-14 DIAGNOSIS — I5042 Chronic combined systolic (congestive) and diastolic (congestive) heart failure: Secondary | ICD-10-CM | POA: Diagnosis not present

## 2016-08-16 DIAGNOSIS — Z433 Encounter for attention to colostomy: Secondary | ICD-10-CM | POA: Diagnosis not present

## 2016-08-16 DIAGNOSIS — Z48815 Encounter for surgical aftercare following surgery on the digestive system: Secondary | ICD-10-CM | POA: Diagnosis not present

## 2016-08-16 DIAGNOSIS — Z430 Encounter for attention to tracheostomy: Secondary | ICD-10-CM | POA: Diagnosis not present

## 2016-08-16 DIAGNOSIS — I5042 Chronic combined systolic (congestive) and diastolic (congestive) heart failure: Secondary | ICD-10-CM | POA: Diagnosis not present

## 2016-08-16 DIAGNOSIS — I2510 Atherosclerotic heart disease of native coronary artery without angina pectoris: Secondary | ICD-10-CM | POA: Diagnosis not present

## 2016-08-16 DIAGNOSIS — I130 Hypertensive heart and chronic kidney disease with heart failure and stage 1 through stage 4 chronic kidney disease, or unspecified chronic kidney disease: Secondary | ICD-10-CM | POA: Diagnosis not present

## 2016-08-16 NOTE — Addendum Note (Signed)
Addended by: Lianne Cure A on: 08/16/2016 04:20 PM   Modules accepted: Orders

## 2016-08-17 DIAGNOSIS — Z48815 Encounter for surgical aftercare following surgery on the digestive system: Secondary | ICD-10-CM | POA: Diagnosis not present

## 2016-08-17 DIAGNOSIS — I5042 Chronic combined systolic (congestive) and diastolic (congestive) heart failure: Secondary | ICD-10-CM | POA: Diagnosis not present

## 2016-08-17 DIAGNOSIS — I2510 Atherosclerotic heart disease of native coronary artery without angina pectoris: Secondary | ICD-10-CM | POA: Diagnosis not present

## 2016-08-17 DIAGNOSIS — Z433 Encounter for attention to colostomy: Secondary | ICD-10-CM | POA: Diagnosis not present

## 2016-08-17 DIAGNOSIS — Z430 Encounter for attention to tracheostomy: Secondary | ICD-10-CM | POA: Diagnosis not present

## 2016-08-17 DIAGNOSIS — I130 Hypertensive heart and chronic kidney disease with heart failure and stage 1 through stage 4 chronic kidney disease, or unspecified chronic kidney disease: Secondary | ICD-10-CM | POA: Diagnosis not present

## 2016-08-18 DIAGNOSIS — Z433 Encounter for attention to colostomy: Secondary | ICD-10-CM | POA: Diagnosis not present

## 2016-08-18 DIAGNOSIS — Z48815 Encounter for surgical aftercare following surgery on the digestive system: Secondary | ICD-10-CM | POA: Diagnosis not present

## 2016-08-18 DIAGNOSIS — I5042 Chronic combined systolic (congestive) and diastolic (congestive) heart failure: Secondary | ICD-10-CM | POA: Diagnosis not present

## 2016-08-18 DIAGNOSIS — Z430 Encounter for attention to tracheostomy: Secondary | ICD-10-CM | POA: Diagnosis not present

## 2016-08-18 DIAGNOSIS — I130 Hypertensive heart and chronic kidney disease with heart failure and stage 1 through stage 4 chronic kidney disease, or unspecified chronic kidney disease: Secondary | ICD-10-CM | POA: Diagnosis not present

## 2016-08-18 DIAGNOSIS — I2510 Atherosclerotic heart disease of native coronary artery without angina pectoris: Secondary | ICD-10-CM | POA: Diagnosis not present

## 2016-08-23 DIAGNOSIS — I2510 Atherosclerotic heart disease of native coronary artery without angina pectoris: Secondary | ICD-10-CM | POA: Diagnosis not present

## 2016-08-23 DIAGNOSIS — Z433 Encounter for attention to colostomy: Secondary | ICD-10-CM | POA: Diagnosis not present

## 2016-08-23 DIAGNOSIS — I5042 Chronic combined systolic (congestive) and diastolic (congestive) heart failure: Secondary | ICD-10-CM | POA: Diagnosis not present

## 2016-08-23 DIAGNOSIS — Z430 Encounter for attention to tracheostomy: Secondary | ICD-10-CM | POA: Diagnosis not present

## 2016-08-23 DIAGNOSIS — I130 Hypertensive heart and chronic kidney disease with heart failure and stage 1 through stage 4 chronic kidney disease, or unspecified chronic kidney disease: Secondary | ICD-10-CM | POA: Diagnosis not present

## 2016-08-23 DIAGNOSIS — Z48815 Encounter for surgical aftercare following surgery on the digestive system: Secondary | ICD-10-CM | POA: Diagnosis not present

## 2016-08-25 DIAGNOSIS — Z430 Encounter for attention to tracheostomy: Secondary | ICD-10-CM | POA: Diagnosis not present

## 2016-08-25 DIAGNOSIS — Z48815 Encounter for surgical aftercare following surgery on the digestive system: Secondary | ICD-10-CM | POA: Diagnosis not present

## 2016-08-25 DIAGNOSIS — I130 Hypertensive heart and chronic kidney disease with heart failure and stage 1 through stage 4 chronic kidney disease, or unspecified chronic kidney disease: Secondary | ICD-10-CM | POA: Diagnosis not present

## 2016-08-25 DIAGNOSIS — I2510 Atherosclerotic heart disease of native coronary artery without angina pectoris: Secondary | ICD-10-CM | POA: Diagnosis not present

## 2016-08-25 DIAGNOSIS — Z433 Encounter for attention to colostomy: Secondary | ICD-10-CM | POA: Diagnosis not present

## 2016-08-25 DIAGNOSIS — I5042 Chronic combined systolic (congestive) and diastolic (congestive) heart failure: Secondary | ICD-10-CM | POA: Diagnosis not present

## 2016-08-26 DIAGNOSIS — Z48815 Encounter for surgical aftercare following surgery on the digestive system: Secondary | ICD-10-CM | POA: Diagnosis not present

## 2016-08-26 DIAGNOSIS — I5042 Chronic combined systolic (congestive) and diastolic (congestive) heart failure: Secondary | ICD-10-CM | POA: Diagnosis not present

## 2016-08-26 DIAGNOSIS — Z430 Encounter for attention to tracheostomy: Secondary | ICD-10-CM | POA: Diagnosis not present

## 2016-08-26 DIAGNOSIS — I2510 Atherosclerotic heart disease of native coronary artery without angina pectoris: Secondary | ICD-10-CM | POA: Diagnosis not present

## 2016-08-26 DIAGNOSIS — I130 Hypertensive heart and chronic kidney disease with heart failure and stage 1 through stage 4 chronic kidney disease, or unspecified chronic kidney disease: Secondary | ICD-10-CM | POA: Diagnosis not present

## 2016-08-26 DIAGNOSIS — Z433 Encounter for attention to colostomy: Secondary | ICD-10-CM | POA: Diagnosis not present

## 2016-08-29 DIAGNOSIS — I5042 Chronic combined systolic (congestive) and diastolic (congestive) heart failure: Secondary | ICD-10-CM | POA: Diagnosis not present

## 2016-08-29 DIAGNOSIS — I130 Hypertensive heart and chronic kidney disease with heart failure and stage 1 through stage 4 chronic kidney disease, or unspecified chronic kidney disease: Secondary | ICD-10-CM | POA: Diagnosis not present

## 2016-08-29 DIAGNOSIS — Z48815 Encounter for surgical aftercare following surgery on the digestive system: Secondary | ICD-10-CM | POA: Diagnosis not present

## 2016-08-29 DIAGNOSIS — Z430 Encounter for attention to tracheostomy: Secondary | ICD-10-CM | POA: Diagnosis not present

## 2016-08-29 DIAGNOSIS — Z433 Encounter for attention to colostomy: Secondary | ICD-10-CM | POA: Diagnosis not present

## 2016-08-29 DIAGNOSIS — I2510 Atherosclerotic heart disease of native coronary artery without angina pectoris: Secondary | ICD-10-CM | POA: Diagnosis not present

## 2016-08-30 DIAGNOSIS — I5042 Chronic combined systolic (congestive) and diastolic (congestive) heart failure: Secondary | ICD-10-CM | POA: Diagnosis not present

## 2016-08-30 DIAGNOSIS — I2510 Atherosclerotic heart disease of native coronary artery without angina pectoris: Secondary | ICD-10-CM | POA: Diagnosis not present

## 2016-08-30 DIAGNOSIS — Z430 Encounter for attention to tracheostomy: Secondary | ICD-10-CM | POA: Diagnosis not present

## 2016-08-30 DIAGNOSIS — Z433 Encounter for attention to colostomy: Secondary | ICD-10-CM | POA: Diagnosis not present

## 2016-08-30 DIAGNOSIS — I130 Hypertensive heart and chronic kidney disease with heart failure and stage 1 through stage 4 chronic kidney disease, or unspecified chronic kidney disease: Secondary | ICD-10-CM | POA: Diagnosis not present

## 2016-08-30 DIAGNOSIS — Z48815 Encounter for surgical aftercare following surgery on the digestive system: Secondary | ICD-10-CM | POA: Diagnosis not present

## 2016-08-31 DIAGNOSIS — Z430 Encounter for attention to tracheostomy: Secondary | ICD-10-CM | POA: Diagnosis not present

## 2016-08-31 DIAGNOSIS — I5042 Chronic combined systolic (congestive) and diastolic (congestive) heart failure: Secondary | ICD-10-CM | POA: Diagnosis not present

## 2016-08-31 DIAGNOSIS — Z433 Encounter for attention to colostomy: Secondary | ICD-10-CM | POA: Diagnosis not present

## 2016-08-31 DIAGNOSIS — I2510 Atherosclerotic heart disease of native coronary artery without angina pectoris: Secondary | ICD-10-CM | POA: Diagnosis not present

## 2016-08-31 DIAGNOSIS — Z48815 Encounter for surgical aftercare following surgery on the digestive system: Secondary | ICD-10-CM | POA: Diagnosis not present

## 2016-08-31 DIAGNOSIS — I130 Hypertensive heart and chronic kidney disease with heart failure and stage 1 through stage 4 chronic kidney disease, or unspecified chronic kidney disease: Secondary | ICD-10-CM | POA: Diagnosis not present

## 2016-09-01 DIAGNOSIS — Z932 Ileostomy status: Secondary | ICD-10-CM | POA: Diagnosis not present

## 2016-09-01 DIAGNOSIS — I2510 Atherosclerotic heart disease of native coronary artery without angina pectoris: Secondary | ICD-10-CM | POA: Diagnosis not present

## 2016-09-01 DIAGNOSIS — I509 Heart failure, unspecified: Secondary | ICD-10-CM | POA: Diagnosis not present

## 2016-09-02 DIAGNOSIS — Z433 Encounter for attention to colostomy: Secondary | ICD-10-CM | POA: Diagnosis not present

## 2016-09-02 DIAGNOSIS — I5042 Chronic combined systolic (congestive) and diastolic (congestive) heart failure: Secondary | ICD-10-CM | POA: Diagnosis not present

## 2016-09-02 DIAGNOSIS — I2510 Atherosclerotic heart disease of native coronary artery without angina pectoris: Secondary | ICD-10-CM | POA: Diagnosis not present

## 2016-09-02 DIAGNOSIS — Z48815 Encounter for surgical aftercare following surgery on the digestive system: Secondary | ICD-10-CM | POA: Diagnosis not present

## 2016-09-02 DIAGNOSIS — I130 Hypertensive heart and chronic kidney disease with heart failure and stage 1 through stage 4 chronic kidney disease, or unspecified chronic kidney disease: Secondary | ICD-10-CM | POA: Diagnosis not present

## 2016-09-02 DIAGNOSIS — Z430 Encounter for attention to tracheostomy: Secondary | ICD-10-CM | POA: Diagnosis not present

## 2016-09-06 ENCOUNTER — Telehealth: Payer: Self-pay | Admitting: Nurse Practitioner

## 2016-09-06 ENCOUNTER — Encounter: Payer: Self-pay | Admitting: *Deleted

## 2016-09-06 DIAGNOSIS — I2510 Atherosclerotic heart disease of native coronary artery without angina pectoris: Secondary | ICD-10-CM | POA: Diagnosis not present

## 2016-09-06 DIAGNOSIS — I130 Hypertensive heart and chronic kidney disease with heart failure and stage 1 through stage 4 chronic kidney disease, or unspecified chronic kidney disease: Secondary | ICD-10-CM | POA: Diagnosis not present

## 2016-09-06 DIAGNOSIS — Z430 Encounter for attention to tracheostomy: Secondary | ICD-10-CM | POA: Diagnosis not present

## 2016-09-06 DIAGNOSIS — Z433 Encounter for attention to colostomy: Secondary | ICD-10-CM | POA: Diagnosis not present

## 2016-09-06 DIAGNOSIS — I5042 Chronic combined systolic (congestive) and diastolic (congestive) heart failure: Secondary | ICD-10-CM | POA: Diagnosis not present

## 2016-09-06 DIAGNOSIS — Z48815 Encounter for surgical aftercare following surgery on the digestive system: Secondary | ICD-10-CM | POA: Diagnosis not present

## 2016-09-06 NOTE — Telephone Encounter (Signed)
Would monitor for now. He has an upcoming appointment. He has known atrial tachycardia. If they feel he needs to be seen then can place with other provider - possibly EP.

## 2016-09-06 NOTE — Telephone Encounter (Signed)
New message      Field nurse, Santiago Glad at [phone], saw pt today and his heart rate was up to 135 and pt is asymptomatic.  Calling to report tachycardia.  Please call field nurse and advise

## 2016-09-06 NOTE — Telephone Encounter (Signed)
lvm for Claiborne Billings the field to give Lori's recommendation's.

## 2016-09-08 DIAGNOSIS — Z433 Encounter for attention to colostomy: Secondary | ICD-10-CM | POA: Diagnosis not present

## 2016-09-08 DIAGNOSIS — Z430 Encounter for attention to tracheostomy: Secondary | ICD-10-CM | POA: Diagnosis not present

## 2016-09-08 DIAGNOSIS — I5042 Chronic combined systolic (congestive) and diastolic (congestive) heart failure: Secondary | ICD-10-CM | POA: Diagnosis not present

## 2016-09-08 DIAGNOSIS — Z48815 Encounter for surgical aftercare following surgery on the digestive system: Secondary | ICD-10-CM | POA: Diagnosis not present

## 2016-09-08 DIAGNOSIS — I2510 Atherosclerotic heart disease of native coronary artery without angina pectoris: Secondary | ICD-10-CM | POA: Diagnosis not present

## 2016-09-08 DIAGNOSIS — I130 Hypertensive heart and chronic kidney disease with heart failure and stage 1 through stage 4 chronic kidney disease, or unspecified chronic kidney disease: Secondary | ICD-10-CM | POA: Diagnosis not present

## 2016-09-13 DIAGNOSIS — I130 Hypertensive heart and chronic kidney disease with heart failure and stage 1 through stage 4 chronic kidney disease, or unspecified chronic kidney disease: Secondary | ICD-10-CM | POA: Diagnosis not present

## 2016-09-13 DIAGNOSIS — Z433 Encounter for attention to colostomy: Secondary | ICD-10-CM | POA: Diagnosis not present

## 2016-09-13 DIAGNOSIS — I2510 Atherosclerotic heart disease of native coronary artery without angina pectoris: Secondary | ICD-10-CM | POA: Diagnosis not present

## 2016-09-13 DIAGNOSIS — Z430 Encounter for attention to tracheostomy: Secondary | ICD-10-CM | POA: Diagnosis not present

## 2016-09-13 DIAGNOSIS — Z48815 Encounter for surgical aftercare following surgery on the digestive system: Secondary | ICD-10-CM | POA: Diagnosis not present

## 2016-09-13 DIAGNOSIS — I5042 Chronic combined systolic (congestive) and diastolic (congestive) heart failure: Secondary | ICD-10-CM | POA: Diagnosis not present

## 2016-09-16 DIAGNOSIS — Z430 Encounter for attention to tracheostomy: Secondary | ICD-10-CM | POA: Diagnosis not present

## 2016-09-16 DIAGNOSIS — I130 Hypertensive heart and chronic kidney disease with heart failure and stage 1 through stage 4 chronic kidney disease, or unspecified chronic kidney disease: Secondary | ICD-10-CM | POA: Diagnosis not present

## 2016-09-16 DIAGNOSIS — I2510 Atherosclerotic heart disease of native coronary artery without angina pectoris: Secondary | ICD-10-CM | POA: Diagnosis not present

## 2016-09-16 DIAGNOSIS — Z433 Encounter for attention to colostomy: Secondary | ICD-10-CM | POA: Diagnosis not present

## 2016-09-16 DIAGNOSIS — I5042 Chronic combined systolic (congestive) and diastolic (congestive) heart failure: Secondary | ICD-10-CM | POA: Diagnosis not present

## 2016-09-16 DIAGNOSIS — Z48815 Encounter for surgical aftercare following surgery on the digestive system: Secondary | ICD-10-CM | POA: Diagnosis not present

## 2016-09-19 DIAGNOSIS — Z430 Encounter for attention to tracheostomy: Secondary | ICD-10-CM | POA: Diagnosis not present

## 2016-09-19 DIAGNOSIS — I130 Hypertensive heart and chronic kidney disease with heart failure and stage 1 through stage 4 chronic kidney disease, or unspecified chronic kidney disease: Secondary | ICD-10-CM | POA: Diagnosis not present

## 2016-09-19 DIAGNOSIS — I5042 Chronic combined systolic (congestive) and diastolic (congestive) heart failure: Secondary | ICD-10-CM | POA: Diagnosis not present

## 2016-09-19 DIAGNOSIS — Z433 Encounter for attention to colostomy: Secondary | ICD-10-CM | POA: Diagnosis not present

## 2016-09-19 DIAGNOSIS — Z48815 Encounter for surgical aftercare following surgery on the digestive system: Secondary | ICD-10-CM | POA: Diagnosis not present

## 2016-09-19 DIAGNOSIS — I2510 Atherosclerotic heart disease of native coronary artery without angina pectoris: Secondary | ICD-10-CM | POA: Diagnosis not present

## 2016-09-20 ENCOUNTER — Encounter (INDEPENDENT_AMBULATORY_CARE_PROVIDER_SITE_OTHER): Payer: Self-pay

## 2016-09-20 ENCOUNTER — Encounter: Payer: Self-pay | Admitting: Nurse Practitioner

## 2016-09-20 ENCOUNTER — Ambulatory Visit (INDEPENDENT_AMBULATORY_CARE_PROVIDER_SITE_OTHER): Payer: Medicare Other | Admitting: Nurse Practitioner

## 2016-09-20 VITALS — BP 110/80 | HR 85 | Ht 71.0 in | Wt 320.4 lb

## 2016-09-20 DIAGNOSIS — E7800 Pure hypercholesterolemia, unspecified: Secondary | ICD-10-CM | POA: Diagnosis not present

## 2016-09-20 DIAGNOSIS — I779 Disorder of arteries and arterioles, unspecified: Secondary | ICD-10-CM

## 2016-09-20 DIAGNOSIS — I471 Supraventricular tachycardia: Secondary | ICD-10-CM

## 2016-09-20 MED ORDER — CLOPIDOGREL BISULFATE 75 MG PO TABS: ORAL_TABLET | ORAL | 3 refills | Status: DC

## 2016-09-20 MED ORDER — FUROSEMIDE 40 MG PO TABS: 40.0000 mg | ORAL_TABLET | Freq: Every day | ORAL | 3 refills | Status: DC

## 2016-09-20 MED ORDER — ATORVASTATIN CALCIUM 40 MG PO TABS: 40.0000 mg | ORAL_TABLET | Freq: Every day | ORAL | 3 refills | Status: DC

## 2016-09-20 MED ORDER — DILTIAZEM HCL 60 MG PO TABS: 60.0000 mg | ORAL_TABLET | Freq: Two times a day (BID) | ORAL | 3 refills | Status: DC

## 2016-09-20 MED ORDER — METOPROLOL TARTRATE 25 MG PO TABS: 37.5000 mg | ORAL_TABLET | Freq: Two times a day (BID) | ORAL | 3 refills | Status: DC

## 2016-09-20 NOTE — Patient Instructions (Addendum)
We will be checking the following labs today - BMET, CBC, HPF  & lipid  Medication Instructions:    Continue with your current medicines.   I sent in your refills today    Testing/Procedures To Be Arranged:  N/A  Follow-Up:   See me in 4 months with EKG    Other Special Instructions:   Walking around the house after the end of every show on TV!    If you need a refill on your cardiac medications before your next appointment, please call your pharmacy.   Call the [place] office at [phone] if you have any questions, problems or concerns.

## 2016-09-20 NOTE — Progress Notes (Signed)
CARDIOLOGY OFFICE NOTE  Date:  09/20/2016    Larry [name] Date of Birth: 06-07-1950 Medical Record #[id number]  PCP:  Lin Landsman, MD  Cardiologist:  [place]   Chief Complaint  Patient presents with  . Irregular Heart Beat    Follow up visit - seen for Dr. Rayann Heman    History of Present Illness: Larry [name] is a 66 y.o. male who presents today for a follow up visit. Seen for Dr. Rayann Heman.   He has an ischemic CM with severe diffuse 3VD and prior stents to the RCA in 7616, systolic and diastolic dysfunction, HTN, HLD and obesity. Has had a history of long standing atrial tach that has been treated with beta blocker and CCB therapy. Was hospitalized back in April of 2013 with a HF exacerbation - had stopped his medicines. Last EF of 40 - to 07% with diastolic dysfunction as well noted in January of 2015.   Seen by me back in October of 2014 - Was out of his medicines again. Had lost weight and was anemic - was sending to GI and ended up having colon cancer.   He was admitted (02/02/2013 - 03/01/2013) to [facility] with subacute history of progressive weakness, constipation and difficulty eating and drinking. He presented with acute renal failure (Creatinine 7.83) and severe anemia (Hbg of 7.3) as well as obstruction. Colonoscopy doing this admission with biopsy revealed adenocarcinoma and patient underwent exploratory lap with partial colectomy with colostomy on 12/23 by Dr. Hulen Skains. Recovery was complicated by wound dehissance, requiring a return to the OR 12/29 with abdominal wound closure with VAC placment. After the procedure, the patient developed atrial tachycardia, so a dilt drip was started (12/29-1/2). On 1/4, the patient had a cardiac arrest, requiring defibrillation, and was subsequently transferred to the ICU. QT was found to be >700, with mild hypokalemia and hypomagnesemia implicated as the cause. Lidocaine drip was started. Electrolyte  abnormality treated and 2D-echo revealed an EF of 40-45% with periapical akinesis and biatrial enlargement. He experienced recurrent torsades on 01/05 and shocked in NSR. By 1/6, QTc had decreased to 499, lidocaine drip was d/c'ed, and patient was transferred out of ICU to stepdown. Then admitted to physical medicine and rehabilitation on 01/09 due to severe deconditioning. He was continued on protein supplement to promote wound healing. His abdominal wound was treated with VAC through 03/05/13. His heart rate was controlled without recurrent arrythmia and was maintained on potassium and magnesium supplements. He was discharged to home on 03/07/2013. He was last evaluated by Dr. Hulen Skains on 02/03 with review of his pathology demonstrating invasive adenocarcinoma of the transverse colon with penetration into the pericolonic fat. He had 0/18 lymph nodes negative for metastatic disease. He was pathologic stage [id number].   Saw Dr. Gwenlyn Found in early July of 2015 for a foot ulcer - basically normal ABI's done.   He was readmitted to the hospital in August of 2015 - triggered by too much salt. Presented with respiratory distress - treated with Bipap and quickly stabilized. Was diuresed about 6 liters as well. Started on low dose ACE. Sleep study recommended.  I have seen back here several times since that hospitalization - he had improved. Titrated as much medicine as BP could tolerate. Felt to be stable from our standpoint. Referred to Sleep Medicine/Dr. Annamaria Boots and had CPAP ordered for his OSA.   Seen in March of 2016 - wanting to get his colostomy reversed and needing pre  op clearance. He was feeling pretty good - no real cardiac symptoms noted. Did have a superficial leg ulcer and was being treated by the wound clinic and had seen Dr. Bridgett Larsson for evaluation as well. Had a Myoview to risk stratify - this was high risk and I referred him on for cardiac cath with Dr. Burt Knack. Underwent PCI to the LAD with DES x 2.  Committed to DAPT for one year ideally.  Seen by me several times in 2017 - was doing ok. Weight was continuing to climb. He was fine with holding off on colostomy reversal while on DAPT. Hadgotten more and more sedentary.   Seen at the cancer center back in September of 2017 - HR elevated - asked to follow up here - I increased his Coreg further. He ended up going in to the hospital for a weekend - still tachycardic - switched over to daily dosing of Verapamil and changed Coreg to Lopressor - looks like his ACE was stopped - pulmonary wanted him on ARB which was to be considered as an outpatient. Seen by pulmonary for a cough which led to a CT scan. HR in the 130's the whole time while in the ER. I then saw him back like 3 days later - he was feeling fine. Not short of breath. No more cough.  To see Dr. Hulen Skains in November for discussion about reversal.  Last seen by me back in November of 2017 - was having his tests done for planned reversal of his colostomy.   Colostomy reversal occurred in February - did not go well - had an anastomotic lead on POD 7 -  ended up with an ileostomy and required a stay at [facility] for respiratory failure. He was seen by cardiology for intermittent tachycardia.   Comes in today. Here with his wife. He is using a walker. He required a stay at SNF after leaving [facility] - he is now home - has been home about a month. He is out of a lot of his medicines. His medicine list has changed considerably.  He says his breathing is ok. Not doing much in the way of activity. The nurse is still coming out to see him. He has no awareness of heart going fast (never has). Not dizzy or lightheaded. No real concerns today. Needs labs.   Past Medical History:  Diagnosis Date  . Arthritis   . Atrial tachycardia ([place])    managed on beta blocker therapy  . Childhood asthma    "went away after I was 16"  . Chronic combined systolic and diastolic CHF, NYHA class 3 (HCC)    has diastolic  heart failure grade 1; EF is 45 to 50% per echo 05/2011; EF 41% by Myoview 2016  . CKD (chronic kidney disease) stage 3, GFR 30-59 ml/min   . Colon cancer ([place]) 2015   MSI high; IHC loss of MLH1 and PMS2; BRAF negative; Negative methylation  . COPD (chronic obstructive pulmonary disease) ([place])   . Coronary artery disease   . Family history of ovarian cancer   . Hypercholesteremia   . Hypertension   . Noncompliance   . NSVT (nonsustained ventricular tachycardia) (HCC)    beta blocker restarted  . Obesity   . OSA on CPAP    used nightly, pt does not know settings  . Peripheral arterial disease (HCC)    4.2 cm thoracic aortic aneurysm per chest ct 11-14-15 epic  . S/P colostomy ([place])    2014  .  Thrombocytopenia ([place])   . Torsades de pointes ([place])    X 2 episodes during hospital visit 12'14"electrolyte imbalance"- "Shocked"    Past Surgical History:  Procedure Laterality Date  . COLON SURGERY    . COLONOSCOPY N/A 02/08/2013   Procedure: COLONOSCOPY;  Surgeon: [name], MD;  Location: [facility];  Service: Endoscopy;  Laterality: N/A;  . COLONOSCOPY WITH PROPOFOL N/A 05/09/2014   Procedure: COLONOSCOPY WITH PROPOFOL;  Surgeon: [name], MD;  Location: [facility] END[facility]COPY;  Service: Endoscopy;  Laterality: N/A;  . COLONOSCOPY WITH PROPOFOL N/A 01/08/2016   Procedure: COLONOSCOPY WITH PROPOFOL;  Surgeon: [name], MD;  Location: [facility];  Service: Endoscopy;  Laterality: N/A;  . COLOSTOMY N/A 04/19/2016   Procedure: COLOSTOMY;  Surgeon: Judeth Horn, MD;  Location: [place] [facility];  Service: General;  Laterality: N/A;  . COLOSTOMY REVERSAL  04/12/2016  . COLOSTOMY REVERSAL N/A 04/12/2016   Procedure: COLOSTOMY REVERSAL;  Surgeon: Judeth Horn, MD;  Location: [facility];  Service: General;  Laterality: N/A;  . CORONARY ANGIOPLASTY WITH STENT PLACEMENT  11/12/2008; 06/11/2014   stent x 2 to RCA; stent x 2 to LAD  . CORONARY ANGIOPLASTY WITH STENT PLACEMENT  06/11/2014   m-LAD 3.5 x 16 mm  Synergy DES, d-LAD  2.25 x 16 mm Synergy DES  . ESOPHAGOGASTRODUODENOSCOPY N/A 02/08/2013   Procedure: ESOPHAGOGASTRODUODENOSCOPY (EGD);  Surgeon: [name], MD;  Location: [facility] ENDOSCOPY;  Service: Endoscopy;  Laterality: N/A;  . FLEXIBLE SIGMOIDOSCOPY N/A 02/19/2016   Procedure: FLEXIBLE SIGMOIDOSCOPY;  Surgeon: [name], MD;  Location: [facility] ENDOSCOPY;  Service: Endoscopy;  Laterality: N/A;  . LAPAROTOMY N/A 02/12/2013   Procedure: EXPLORATORY LAPAROTOMY PARTIAL COLECTOMY WITH COLOSTOMY;  Surgeon: [name], MD;  Location: [facility];  Service: General;  Laterality: N/A;  . LAPAROTOMY N/A 02/18/2013   Procedure: EXPLORATORY LAPAROTOMY/Closure of Wound;  Surgeon: [name], MD;  Location: [facility];  Service: General;  Laterality: N/A;  . LAPAROTOMY N/A 04/19/2016   Procedure: EXPLORATORY LAPAROTOMY, REPAIR OF ANASTAMOTIC LEAK;  Surgeon: Judeth Horn, MD;  Location: [facility];  Service: General;  Laterality: N/A;  . LEFT HEART CATHETERIZATION WITH CORONARY ANGIOGRAM N/A 06/11/2014   Procedure: LEFT HEART CATHETERIZATION WITH CORONARY ANGIOGRAM;  Surgeon: [name], MD; CFX calcified, 30-40 percent, RCA calcified, 40/50/40%, PDA diffuse disease, LAD 40/75/90% s/p DES 2      Medications: Current Meds  Medication Sig  . aspirin EC 81 MG tablet Take 1 tablet (81 mg total) by mouth daily.  [name] Kitchen atorvastatin (LIPITOR) 40 MG tablet Take 1 tablet (40 mg total) by mouth daily.  . clopidogrel (PLAVIX) 75 MG tablet TAKE 1 TABLET (75 MG TOTAL) BY MOUTH DAILY WITH BREAKFAST.  [name] Kitchen diltiazem (CARDIZEM) 60 MG tablet Take 1 tablet (60 mg total) by mouth 2 (two) times daily.  . ferrous sulfate 325 (65 FE) MG tablet Take 1 tablet (325 mg total) by mouth daily with breakfast.  . furosemide (LASIX) 40 MG tablet Take 1 tablet (40 mg total) by mouth daily.  [name] Kitchen levalbuterol (XOPENEX HFA) 45 MCG/ACT inhaler INHALE 2 PUFFS INTO THE LUNGS EVERY 4 (FOUR) HOURS AS NEEDED FOR WHEEZING.  . metoprolol tartrate (LOPRESSOR)  25 MG tablet Take 1.5 tablets (37.5 mg total) by mouth 2 (two) times daily.  . Multiple Vitamins-Minerals (MULTIVITAMIN WITH MINERALS) tablet Take 1 tablet by mouth daily.  . nitroGLYCERIN (NITROSTAT) 0.4 MG SL tablet Place 0.4 mg under the tongue every 5 (five) minutes as needed for chest pain.  [name] Kitchen ondansetron (ZOFRAN)  8 MG tablet TAKE 1 TABLET EVERY 6 HOURS AS NEEDED FOR NAUSEA AND VOMITING  . simethicone (MYLICON) 161 MG chewable tablet Chew 125 mg by mouth every 6 (six) hours as needed for flatulence.  . [DISCONTINUED] atorvastatin (LIPITOR) 40 MG tablet TAKE 1 TABLET BY MOUTH DAILY  . [DISCONTINUED] clopidogrel (PLAVIX) 75 MG tablet TAKE 1 TABLET (75 MG TOTAL) BY MOUTH DAILY WITH BREAKFAST.  . [DISCONTINUED] diltiazem (CARDIZEM) 60 MG tablet Take 60 mg by mouth 2 (two) times daily.  . [DISCONTINUED] furosemide (LASIX) 40 MG tablet Take 1 tablet (40 mg total) by mouth daily.  . [DISCONTINUED] metoprolol tartrate (LOPRESSOR) 25 MG tablet Take 1.5 tablets (37.5 mg total) by mouth 2 (two) times daily.     Allergies: Allergies  Allergen Reactions  . No Known Allergies     Social History: The patient  reports that he quit smoking about 8 years ago. His smoking use included Cigarettes. He has a 38.00 pack-year smoking history. He has never used smokeless tobacco. He reports that he drinks alcohol. He reports that he does not use drugs.   Family History: The patient's family history includes Dementia in his mother; Diabetes in his father; Heart disease in his father; Hypertension in his father and mother; Leukemia in his maternal uncle; Liver cancer in his maternal grandmother; Parkinsonism in his mother; Prostate cancer in his maternal uncle.   Review of Systems: Please see the history of present illness.   Otherwise, the review of systems is positive for none.   All other systems are reviewed and negative.   Physical Exam: VS:  BP 110/80 (BP Location: Left Arm, Patient Position: Sitting,  Cuff Size: Large)   Pulse 85   Ht _0  (1.803 m)   Wt (!) 320 lb 6.4 oz (145.3 kg)   BMI 44.69 kg/m  .  BMI Body mass index is 44.69 kg/m.  Wt Readings from Last 3 Encounters:  09/20/16 (!) 320 lb 6.4 oz (145.3 kg)  08/12/16 (!) 304 lb (137.9 kg)  05/12/16 (!) 354 lb (160.6 kg)    General: Pleasant. Chronically black male who is alert and in no acute distress. Remains obese but weight is down from last visit with me.   HEENT: Normal.  Neck: Supple, no JVD, carotid bruits, or masses noted.  Cardiac: Regular rate and rhythm. Heart tones are distant. Legs are quite full with chronic edema.  Respiratory:  Lungs are clear to auscultation bilaterally with normal work of breathing.  GI: Soft and nontender.  MS: No deformity or atrophy. Gait and ROM intact. Using a walker.  Skin: Warm and dry. Color is normal.  Neuro:  Strength and sensation are intact and no gross focal deficits noted.  Psych: Alert, appropriate and with normal affect.   LABORATORY DATA:  EKG:  EKG is ordered today. This demonstrates sinus with 1st degree AV block - lots of artifact noted. HR is at 85 today.  Lab Results  Component Value Date   WBC 7.0 06/08/2016   HGB 9.2 (L) 06/08/2016   HCT 30.0 (L) 06/08/2016   PLT 218 06/08/2016   GLUCOSE 92 06/08/2016   CHOL 168 06/29/2015   TRIG 246 (H) 05/02/2016   HDL 47 06/29/2015   LDLCALC 93 06/29/2015   ALT 66 (H) 05/14/2016   AST 45 (H) 05/14/2016   NA 135 06/08/2016   K 4.1 06/08/2016   CL 103 06/08/2016   CREATININE 1.74 (H) 06/08/2016   BUN 26 (H) 06/08/2016   CO2  19 (L) 06/08/2016   TSH 1.080 05/14/2016   INR 1.12 05/14/2016   HGBA1C 5.9 (H) 05/14/2016     BNP (last 3 results)  Recent Labs  11/13/15 0928 04/20/16 1055  BNP 9.2 43.3    ProBNP (last 3 results) No results for input(s): PROBNP in the last 8760 hours.   Other Studies Reviewed Today:  Echo Study Conclusions 03/2016  - Left ventricle: The cavity size was normal. Wall  thickness was   increased in a pattern of moderate LVH. Systolic function was   normal. The estimated ejection fraction was in the range of 55%   to 60%. Wall motion was normal; there were no regional wall   motion abnormalities. The study is not technically sufficient to   allow evaluation of LV diastolic function. - Aortic root: The aortic root was mildly dilated. - Mitral valve: Calcified annulus.  Impressions:  - Technically difficult; definity used; normal LV function;   moderate LVH.   MyoviewStudy Highlights 01/2016   Nuclear stress EF: 58%.  There was no ST segment deviation noted during stress.  There is a large defect of severe severity present in the basal inferior, mid inferior, apical septal, apical inferior and apex location. The defect is non-reversible. No ischemia noted. Tthis is consistent with diaphragmatic attenuation artifact vs. Scar.  This is a low risk study.  The left ventricular ejection fraction is normal (55-65%).       Procedure: Left Heart Cath, Selective Coronary Angiography, PTCA and stenting of the mid and distal LAD April 2016.  Coronary angiography: Left mainstem: The left mainstem is patent. The vessel divides into the LAD and left circumflex. There is no significant stenosis, but there is mild irregularity.  Left anterior descending (LAD): The LAD is moderately calcified. The vessel is severely diseased. The proximal LAD is patent with 30-40% stenosis after the second septal perforator. There is diffuse calcification. The diagonal branches are small. The mid LAD has an eccentric 75% stenosis at the origin of the second diagonal branch. Beyond that area the mid and distal LAD are diffusely diseased. There is another severe stenosis in the apical LAD of 90%.  Left circumflex (LCx): The left circumflex is calcified. The vessel is patent with mild irregularity. There are no high-grade stenoses identified. There are scattered  30-40% stenoses throughout the proximal and mid circumflex as well as the first OM branch.  Right coronary artery (RCA): The RCA is dominant. The vessel is heavily calcified. The stented segments in the mid and distal RCA are patent. The proximal vessel has 30-40% stenosis. The mid vessel has 30-40% stenosis. The distal vessel is patent with 50-60% stenosis involving the origin of the PDA. The PDA is diffusely diseased.  Left ventriculography: Deferred because of chronic kidney disease. By nuclear scan the LVEF is 41%.  PCI Note: Following the diagnostic procedure, the decision was made to proceed with PCI of the mid and apical LAD. The right coronary artery had patent stents in the left circumflex had nonobstructive disease. The LAD is a diffusely diseased vessel and the mid and distal vessel would be a poor surgical targets. I felt that PCI was the best option in this patient with a high risk nuclear scan demonstrating anteroapical and anterolateral ischemia. He will require colostomy reversal, so I planned on treating him with Synergy drug-eluting stents which had a biodegradable polymer and potentially can allow for earlier interruption of dual antiplatelet therapy. The patient was loaded with Plavix 600 mg. Weight-based bivalirudin  was given for anticoagulation. Once a therapeutic ACT was achieved, a 6 [place] XB LAD 3.5 cm guide catheter was inserted. A cougar coronary guidewire was used to cross the lesion in the apical LAD. The lesion was predilated with a 2.5 x 12 mm balloon. The same balloon was used to dilate the mid lesion. The apical lesion was then stented with a 2.25 x 16 mm Synergy DES. The stent was postdilated with a 2.5 mm noncompliant balloon. The mid lesion was then stented with a 3.5 x 16 mm Synergy DES. That stent was postdilated with a 3.75 mm noncompliant balloon. Following PCI, there was 0% residual stenosis and TIMI-3 flow. Final angiography confirmed an excellent result.  The patient tolerated the procedure well. There were no immediate procedural complications. A TR band was used for radial hemostasis. The patient was transferred to the post catheterization recovery area for further monitoring.  PCI Data: Lesion 1: Vessel - LAD/Segment - distal/apical Percent Stenosis (pre) 90 TIMI-flow 3 Stent 2.25 x 16 mm Synergy DES Percent Stenosis (post) 0 TIMI-flow (post) 3  Lesion 2: Vessel - LAD/Segment - mid Percent Stenosis (pre) 75 TIMI-flow 3 Stent 3.5 x 16 mm Synergy DES Percent Stenosis (post) 0 TIMI-flow (post) 3  Estimated Blood Loss: Minimal  Final Conclusions:  1. Two-vessel coronary artery disease with continued patency of the stented segments in the right coronary artery and severe stenoses of the mid and apical LAD 2. Mild diffuse nonobstructive left circumflex stenosis 3. Successful PCI of the LAD using 2 drug-eluting stents   Recommendations:  Dual antiplatelet therapy for at least 6 months. Would be reasonable to consider interruption of aspirin and Plavix at 6 months for colostomy reversal.  [name] MD, [facility] 06/11/2014, 10:06 AM   Assessment / Plan:  1. CAD with prior PCI to the RCA - S/P cath with PCI to the LAD back in April of 2016 following high risk Myoview - has had DES x 2 to the LAD - on aspirin/Plavix. Follow up Myoview from 01/2016 was low risk. He remains on Plavix. No current symptoms. No change with current regimen. Medicines refilled today.   2. History of combined systolic and diastolic HF - most recent echo with normal EF now noted. Back on his Lasix. EF is ok. Lab today. No changes made.   3. Colon cancer - followed by oncology - has had attempt at colostomy reversal now with ileostomy in place.  Still seems pretty deconditioned - activity encouraged.   4. HTN - stable on present regimen. Medicines refilled today.   5. CKD - off ACE.   6. OSA - now on CPAP and followed by Dr. Annamaria Boots - using  sporadically.   7. HLD - on statin therapy - need to watch his LFTs  8. Atrial tachycardia - chronic issue - he has been totally asymptomatic.  He is back on low dose beta blocker and CCB therapy - looks to be in sinus rhythm today.    Current medicines are reviewed with the patient today.  The patient does not have concerns regarding medicines other than what has been noted above.  The following changes have been made:  See above.  Labs/ tests ordered today include:    Orders Placed This Encounter  Procedures  . Basic metabolic panel  . CBC  . Hepatic function panel  . EKG 12-Lead     Disposition:   FU with me in 4 months.  in {gen  Patient is agreeable to this  plan and will call if any problems develop in the interim.   SignedTruitt Merle, NP  09/20/2016 3:13 PM  [facility] [address] [place], [place]  [address] Phone: [phone] Fax: [phone]

## 2016-09-21 DIAGNOSIS — Z48815 Encounter for surgical aftercare following surgery on the digestive system: Secondary | ICD-10-CM | POA: Diagnosis not present

## 2016-09-21 DIAGNOSIS — Z9989 Dependence on other enabling machines and devices: Secondary | ICD-10-CM | POA: Diagnosis not present

## 2016-09-21 DIAGNOSIS — I4891 Unspecified atrial fibrillation: Secondary | ICD-10-CM | POA: Diagnosis not present

## 2016-09-21 DIAGNOSIS — I739 Peripheral vascular disease, unspecified: Secondary | ICD-10-CM | POA: Diagnosis not present

## 2016-09-21 DIAGNOSIS — I2510 Atherosclerotic heart disease of native coronary artery without angina pectoris: Secondary | ICD-10-CM | POA: Diagnosis not present

## 2016-09-21 DIAGNOSIS — Z7902 Long term (current) use of antithrombotics/antiplatelets: Secondary | ICD-10-CM | POA: Diagnosis not present

## 2016-09-21 DIAGNOSIS — Z85038 Personal history of other malignant neoplasm of large intestine: Secondary | ICD-10-CM | POA: Diagnosis not present

## 2016-09-21 DIAGNOSIS — I5042 Chronic combined systolic (congestive) and diastolic (congestive) heart failure: Secondary | ICD-10-CM | POA: Diagnosis not present

## 2016-09-21 DIAGNOSIS — Z6841 Body Mass Index (BMI) 40.0 and over, adult: Secondary | ICD-10-CM | POA: Diagnosis not present

## 2016-09-21 DIAGNOSIS — N183 Chronic kidney disease, stage 3 (moderate): Secondary | ICD-10-CM | POA: Diagnosis not present

## 2016-09-21 DIAGNOSIS — J449 Chronic obstructive pulmonary disease, unspecified: Secondary | ICD-10-CM | POA: Diagnosis not present

## 2016-09-21 DIAGNOSIS — E669 Obesity, unspecified: Secondary | ICD-10-CM | POA: Diagnosis not present

## 2016-09-21 DIAGNOSIS — Z433 Encounter for attention to colostomy: Secondary | ICD-10-CM | POA: Diagnosis not present

## 2016-09-21 DIAGNOSIS — I130 Hypertensive heart and chronic kidney disease with heart failure and stage 1 through stage 4 chronic kidney disease, or unspecified chronic kidney disease: Secondary | ICD-10-CM | POA: Diagnosis not present

## 2016-09-21 DIAGNOSIS — Z430 Encounter for attention to tracheostomy: Secondary | ICD-10-CM | POA: Diagnosis not present

## 2016-09-21 LAB — CBC
Hematocrit: 33.7 % — ABNORMAL LOW (ref 37.5–51.0)
Hemoglobin: 10.6 g/dL — ABNORMAL LOW (ref 13.0–17.7)
MCH: 28.2 pg (ref 26.6–33.0)
MCHC: 31.5 g/dL (ref 31.5–35.7)
MCV: 90 fL (ref 79–97)
Platelets: 195 10*3/uL (ref 150–379)
RBC: 3.76 x10E6/uL — ABNORMAL LOW (ref 4.14–5.80)
RDW: 15.8 % — ABNORMAL HIGH (ref 12.3–15.4)
WBC: 6.6 10*3/uL (ref 3.4–10.8)

## 2016-09-21 LAB — HEPATIC FUNCTION PANEL
ALT: 13 IU/L (ref 0–44)
AST: 13 IU/L (ref 0–40)
Albumin: 4 g/dL (ref 3.6–4.8)
Alkaline Phosphatase: 108 IU/L (ref 39–117)
Bilirubin Total: 0.5 mg/dL (ref 0.0–1.2)
Bilirubin, Direct: 0.14 mg/dL (ref 0.00–0.40)
Total Protein: 7.2 g/dL (ref 6.0–8.5)

## 2016-09-21 LAB — BASIC METABOLIC PANEL
BUN/Creatinine Ratio: 9 — ABNORMAL LOW (ref 10–24)
BUN: 15 mg/dL (ref 8–27)
CO2: 20 mmol/L (ref 20–29)
Calcium: 9.9 mg/dL (ref 8.6–10.2)
Chloride: 106 mmol/L (ref 96–106)
Creatinine, Ser: 1.73 mg/dL — ABNORMAL HIGH (ref 0.76–1.27)
GFR calc Af Amer: 48 mL/min/{1.73_m2} — ABNORMAL LOW (ref >59)
GFR calc non Af Amer: 41 mL/min/{1.73_m2} — ABNORMAL LOW (ref >59)
Glucose: 92 mg/dL (ref 65–99)
Potassium: 3.5 mmol/L (ref 3.5–5.2)
Sodium: 142 mmol/L (ref 134–144)

## 2016-09-22 DIAGNOSIS — Z48815 Encounter for surgical aftercare following surgery on the digestive system: Secondary | ICD-10-CM | POA: Diagnosis not present

## 2016-09-22 DIAGNOSIS — Z430 Encounter for attention to tracheostomy: Secondary | ICD-10-CM | POA: Diagnosis not present

## 2016-09-22 DIAGNOSIS — I130 Hypertensive heart and chronic kidney disease with heart failure and stage 1 through stage 4 chronic kidney disease, or unspecified chronic kidney disease: Secondary | ICD-10-CM | POA: Diagnosis not present

## 2016-09-22 DIAGNOSIS — I2510 Atherosclerotic heart disease of native coronary artery without angina pectoris: Secondary | ICD-10-CM | POA: Diagnosis not present

## 2016-09-22 DIAGNOSIS — I5042 Chronic combined systolic (congestive) and diastolic (congestive) heart failure: Secondary | ICD-10-CM | POA: Diagnosis not present

## 2016-09-22 DIAGNOSIS — Z433 Encounter for attention to colostomy: Secondary | ICD-10-CM | POA: Diagnosis not present

## 2016-09-27 DIAGNOSIS — I5042 Chronic combined systolic (congestive) and diastolic (congestive) heart failure: Secondary | ICD-10-CM | POA: Diagnosis not present

## 2016-09-27 DIAGNOSIS — Z430 Encounter for attention to tracheostomy: Secondary | ICD-10-CM | POA: Diagnosis not present

## 2016-09-27 DIAGNOSIS — Z48815 Encounter for surgical aftercare following surgery on the digestive system: Secondary | ICD-10-CM | POA: Diagnosis not present

## 2016-09-27 DIAGNOSIS — Z433 Encounter for attention to colostomy: Secondary | ICD-10-CM | POA: Diagnosis not present

## 2016-09-27 DIAGNOSIS — I130 Hypertensive heart and chronic kidney disease with heart failure and stage 1 through stage 4 chronic kidney disease, or unspecified chronic kidney disease: Secondary | ICD-10-CM | POA: Diagnosis not present

## 2016-09-27 DIAGNOSIS — I2510 Atherosclerotic heart disease of native coronary artery without angina pectoris: Secondary | ICD-10-CM | POA: Diagnosis not present

## 2016-09-29 DIAGNOSIS — Z430 Encounter for attention to tracheostomy: Secondary | ICD-10-CM | POA: Diagnosis not present

## 2016-09-29 DIAGNOSIS — Z433 Encounter for attention to colostomy: Secondary | ICD-10-CM | POA: Diagnosis not present

## 2016-09-29 DIAGNOSIS — I2510 Atherosclerotic heart disease of native coronary artery without angina pectoris: Secondary | ICD-10-CM | POA: Diagnosis not present

## 2016-09-29 DIAGNOSIS — Z48815 Encounter for surgical aftercare following surgery on the digestive system: Secondary | ICD-10-CM | POA: Diagnosis not present

## 2016-09-29 DIAGNOSIS — I130 Hypertensive heart and chronic kidney disease with heart failure and stage 1 through stage 4 chronic kidney disease, or unspecified chronic kidney disease: Secondary | ICD-10-CM | POA: Diagnosis not present

## 2016-09-29 DIAGNOSIS — I5042 Chronic combined systolic (congestive) and diastolic (congestive) heart failure: Secondary | ICD-10-CM | POA: Diagnosis not present

## 2016-10-04 DIAGNOSIS — I130 Hypertensive heart and chronic kidney disease with heart failure and stage 1 through stage 4 chronic kidney disease, or unspecified chronic kidney disease: Secondary | ICD-10-CM | POA: Diagnosis not present

## 2016-10-04 DIAGNOSIS — I5042 Chronic combined systolic (congestive) and diastolic (congestive) heart failure: Secondary | ICD-10-CM | POA: Diagnosis not present

## 2016-10-04 DIAGNOSIS — Z48815 Encounter for surgical aftercare following surgery on the digestive system: Secondary | ICD-10-CM | POA: Diagnosis not present

## 2016-10-04 DIAGNOSIS — Z430 Encounter for attention to tracheostomy: Secondary | ICD-10-CM | POA: Diagnosis not present

## 2016-10-04 DIAGNOSIS — Z433 Encounter for attention to colostomy: Secondary | ICD-10-CM | POA: Diagnosis not present

## 2016-10-04 DIAGNOSIS — I2510 Atherosclerotic heart disease of native coronary artery without angina pectoris: Secondary | ICD-10-CM | POA: Diagnosis not present

## 2016-10-06 ENCOUNTER — Emergency Department (HOSPITAL_COMMUNITY)
Admission: EM | Admit: 2016-10-06 | Discharge: 2016-10-06 | Disposition: A | Discharge status: Home / Self Care | Payer: Medicare Other | Attending: Emergency Medicine | Admitting: Emergency Medicine

## 2016-10-06 ENCOUNTER — Encounter (HOSPITAL_COMMUNITY): Payer: Self-pay | Admitting: Emergency Medicine

## 2016-10-06 DIAGNOSIS — I130 Hypertensive heart and chronic kidney disease with heart failure and stage 1 through stage 4 chronic kidney disease, or unspecified chronic kidney disease: Secondary | ICD-10-CM | POA: Diagnosis not present

## 2016-10-06 DIAGNOSIS — Z87891 Personal history of nicotine dependence: Secondary | ICD-10-CM | POA: Diagnosis not present

## 2016-10-06 DIAGNOSIS — Z7902 Long term (current) use of antithrombotics/antiplatelets: Secondary | ICD-10-CM | POA: Diagnosis not present

## 2016-10-06 DIAGNOSIS — I5042 Chronic combined systolic (congestive) and diastolic (congestive) heart failure: Secondary | ICD-10-CM | POA: Insufficient documentation

## 2016-10-06 DIAGNOSIS — I2510 Atherosclerotic heart disease of native coronary artery without angina pectoris: Secondary | ICD-10-CM | POA: Insufficient documentation

## 2016-10-06 DIAGNOSIS — Z79899 Other long term (current) drug therapy: Secondary | ICD-10-CM | POA: Diagnosis not present

## 2016-10-06 DIAGNOSIS — S31119A Laceration without foreign body of abdominal wall, unspecified quadrant without penetration into peritoneal cavity, initial encounter: Secondary | ICD-10-CM | POA: Diagnosis not present

## 2016-10-06 DIAGNOSIS — R58 Hemorrhage, not elsewhere classified: Secondary | ICD-10-CM

## 2016-10-06 DIAGNOSIS — Z85038 Personal history of other malignant neoplasm of large intestine: Secondary | ICD-10-CM | POA: Insufficient documentation

## 2016-10-06 DIAGNOSIS — Z7982 Long term (current) use of aspirin: Secondary | ICD-10-CM | POA: Diagnosis not present

## 2016-10-06 DIAGNOSIS — Z433 Encounter for attention to colostomy: Secondary | ICD-10-CM | POA: Diagnosis not present

## 2016-10-06 DIAGNOSIS — J449 Chronic obstructive pulmonary disease, unspecified: Secondary | ICD-10-CM | POA: Diagnosis not present

## 2016-10-06 DIAGNOSIS — Z48815 Encounter for surgical aftercare following surgery on the digestive system: Secondary | ICD-10-CM | POA: Diagnosis not present

## 2016-10-06 DIAGNOSIS — N183 Chronic kidney disease, stage 3 (moderate): Secondary | ICD-10-CM | POA: Diagnosis not present

## 2016-10-06 DIAGNOSIS — L7622 Postprocedural hemorrhage and hematoma of skin and subcutaneous tissue following other procedure: Secondary | ICD-10-CM | POA: Insufficient documentation

## 2016-10-06 DIAGNOSIS — Z430 Encounter for attention to tracheostomy: Secondary | ICD-10-CM | POA: Diagnosis not present

## 2016-10-06 DIAGNOSIS — L7621 Postprocedural hemorrhage and hematoma of skin and subcutaneous tissue following a dermatologic procedure: Secondary | ICD-10-CM | POA: Diagnosis not present

## 2016-10-06 LAB — CBC
HEMATOCRIT: 33.5 % — AB (ref 39.0–52.0)
Hemoglobin: 10.4 g/dL — ABNORMAL LOW (ref 13.0–17.0)
MCH: 27.7 pg (ref 26.0–34.0)
MCHC: 31 g/dL (ref 30.0–36.0)
MCV: 89.3 fL (ref 78.0–100.0)
PLATELETS: 157 10*3/uL (ref 150–400)
RBC: 3.75 MIL/uL — AB (ref 4.22–5.81)
RDW: 15.8 % — ABNORMAL HIGH (ref 11.5–15.5)
WBC: 6.4 10*3/uL (ref 4.0–10.5)

## 2016-10-06 LAB — COMPREHENSIVE METABOLIC PANEL
ALT: 18 U/L (ref 17–63)
AST: 17 U/L (ref 15–41)
Albumin: 3.8 g/dL (ref 3.5–5.0)
Alkaline Phosphatase: 97 U/L (ref 38–126)
Anion gap: 8 (ref 5–15)
BUN: 22 mg/dL — ABNORMAL HIGH (ref 6–20)
CHLORIDE: 108 mmol/L (ref 101–111)
CO2: 23 mmol/L (ref 22–32)
CREATININE: 1.86 mg/dL — AB (ref 0.61–1.24)
Calcium: 9.7 mg/dL (ref 8.9–10.3)
GFR, EST AFRICAN AMERICAN: 43 mL/min — AB (ref >60)
GFR, EST NON AFRICAN AMERICAN: 37 mL/min — AB (ref >60)
Glucose, Bld: 102 mg/dL — ABNORMAL HIGH (ref 65–99)
POTASSIUM: 3.2 mmol/L — AB (ref 3.5–5.1)
Sodium: 139 mmol/L (ref 135–145)
Total Bilirubin: 1.2 mg/dL (ref 0.3–1.2)
Total Protein: 7.2 g/dL (ref 6.5–8.1)

## 2016-10-06 LAB — TYPE AND SCREEN
ABO/RH(D): AB NEG
ANTIBODY SCREEN: NEGATIVE

## 2016-10-06 MED ORDER — LIDOCAINE HCL (PF) 1 % IJ SOLN
INTRAMUSCULAR | Status: AC
  Filled 2016-10-06: qty 30

## 2016-10-06 MED ORDER — LIDOCAINE HCL (PF) 1 % IJ SOLN
30.0000 mL | Freq: Once | INTRAMUSCULAR | Status: AC
Start: 2016-10-06 — End: 2016-10-06
  Administered 2016-10-06: 30 mL

## 2016-10-06 NOTE — ED Provider Notes (Signed)
[facility] DEPT Provider Note   CSN: [id number] Arrival date & time: 10/06/16  1527     History   Chief Complaint Chief Complaint  Patient presents with  . Coagulation Disorder    HPI Larry [name] is a 66 y.o. male.  66 year old male who complains of one-week history of losing from 2 areas on a prior abdominal wound. Wound was allowed to granulate after the left open after surgery several months ago. He is currently being a home health nurse and that periodically for the past week he has had bright red blood oozing which is been responsive to direct pressure. Today became worse. He denies being lightheaded or dizzy. Denies any weakness. He does take Plavix.      Past Medical History:  Diagnosis Date  . Arthritis   . Atrial tachycardia ([place])    managed on beta blocker therapy  . Childhood asthma    "went away after I was 16"  . Chronic combined systolic and diastolic CHF, NYHA class 3 (HCC)    has diastolic heart failure grade 1; EF is 45 to 50% per echo 05/2011; EF 41% by Myoview 2016  . CKD (chronic kidney disease) stage 3, GFR 30-59 ml/min   . Colon cancer ([place]) 2015   MSI high; IHC loss of MLH1 and PMS2; BRAF negative; Negative methylation  . COPD (chronic obstructive pulmonary disease) ([place])   . Coronary artery disease   . Family history of ovarian cancer   . Hypercholesteremia   . Hypertension   . Noncompliance   . NSVT (nonsustained ventricular tachycardia) (HCC)    beta blocker restarted  . Obesity   . OSA on CPAP    used nightly, pt does not know settings  . Peripheral arterial disease (HCC)    4.2 cm thoracic aortic aneurysm per chest ct 11-14-15 epic  . S/P colostomy ([place])    2014  . Thrombocytopenia ([place])   . Torsades de pointes ([place])    X 2 episodes during hospital visit 12'14"electrolyte imbalance"- "Shocked"    Patient Active Problem List   Diagnosis Date Noted  . Genetic testing 08/08/2016  . Acute pulmonary edema (HCC)   . Acute  respiratory failure with hypoxia ([place])   . SBO (small bowel obstruction) ([place])   . Encounter for nasogastric tube placement   . Abdominal pain   . Hypervolemia   . Pressure injury of skin 04/25/2016  . Acute respiratory failure ([place])   . Adynamic ileus ([place])   . Peritonitis ([place])   . AKI (acute kidney injury) ([place]) 04/19/2016  . Colostomy in place [facility]) 04/12/2016  . Pulmonary infiltrate present on computed tomography 11/15/2015  . Cough 11/14/2015  . Lung nodule   . Atrial tachycardia ([place])   . Chronic combined systolic and diastolic CHF, NYHA class 3 ([place])   . Palpitations   . Dyspnea and respiratory abnormalities   . Family history of ovarian cancer   . CKD (chronic kidney disease) stage 3, GFR 30-59 ml/min 06/12/2014  . Thrombocytopenia ([place])   . Ischemic chest pain 06/11/2014  . Atherosclerosis of artery of extremity with ulceration ([place]) 06/06/2014  . Venous stasis ulcer of left lower extremity ([place]) 06/06/2014  . Acute combined systolic and diastolic congestive heart failure ([place]) 10/03/2013  . Acute respiratory distress 09/30/2013  . Respiratory distress 09/30/2013  . Peripheral arterial disease ([place]) 08/21/2013  . Postop check 03/26/2013  . Physical deconditioning 03/01/2013  . Torsades de pointes ([place]) 02/27/2013  . Acute on chronic  diastolic heart failure ([place]) 02/15/2013  . Colon cancer ([place]) 02/09/2013  . Atrial fibrillation ([place]) 02/05/2013  . Acute renal failure ([place]) 02/02/2013  . Hypotension 02/02/2013  . Chronic blood loss anemia 02/02/2013  . Edema 06/16/2011  . Tachycardia 06/16/2011  . IMPOTENCE OF ORGANIC ORIGIN 01/12/2009  . Obstructive sleep apnea 12/04/2008  . HYPERLIPIDEMIA 12/03/2008  . Obesity 12/03/2008  . Coronary atherosclerosis 12/03/2008  . CARDIOMYOPATHY, ISCHEMIC 12/03/2008  . Atrial tachycardia, paroxysmal ([place]) 12/03/2008  . ASTHMA 12/03/2008  . C O P D 12/03/2008  . Essential hypertension 12/02/2008    Past Surgical History:    Procedure Laterality Date  . COLON SURGERY    . COLONOSCOPY N/A 02/08/2013   Procedure: COLONOSCOPY;  Surgeon: [name], MD;  Location: [facility];  Service: Endoscopy;  Laterality: N/A;  . COLONOSCOPY WITH PROPOFOL N/A 05/09/2014   Procedure: COLONOSCOPY WITH PROPOFOL;  Surgeon: [name], MD;  Location: [facility];  Service: Endoscopy;  Laterality: N/A;  . COLONOSCOPY WITH PROPOFOL N/A 01/08/2016   Procedure: COLONOSCOPY WITH PROPOFOL;  Surgeon: [name], MD;  Location: [facility];  Service: Endoscopy;  Laterality: N/A;  . COLOSTOMY N/A 04/19/2016   Procedure: COLOSTOMY;  Surgeon: Judeth Horn, MD;  Location: [facility];  Service: General;  Laterality: N/A;  . COLOSTOMY REVERSAL  04/12/2016  . COLOSTOMY REVERSAL N/A 04/12/2016   Procedure: COLOSTOMY REVERSAL;  Surgeon: Judeth Horn, MD;  Location: [facility];  Service: General;  Laterality: N/A;  . CORONARY ANGIOPLASTY WITH STENT PLACEMENT  11/12/2008; 06/11/2014   stent x 2 to RCA; stent x 2 to LAD  . CORONARY ANGIOPLASTY WITH STENT PLACEMENT  06/11/2014   m-LAD 3.5 x 16 mm Synergy DES, d-LAD  2.25 x 16 mm Synergy DES  . ESOPHAGOGASTRODUODENOSCOPY N/A 02/08/2013   Procedure: ESOPHAGOGASTRODUODENOSCOPY (EGD);  Surgeon: [name], MD;  Location[facility] ENDOSCOPY;  Service: Endoscopy;  Laterality: N/A;  . FLEXIBLE SIGMOIDOSCOPY N/A 02/19/2016   Procedure: FLEXIBLE SIGMOIDOSCOPY;  Surgeon: [name], MD;  Location: [facility];  Service: Endoscopy;  Laterality: N/A;  . LAPAROTOMY N/A 02/12/2013   Procedure: EXPLORATORY LAPAROTOMY PARTIAL COLECTOMY WITH COLOSTOMY;  Surgeon: [name], MD;  Location: [facility];  Service: General;  Laterality: N/A;  . LAPAROTOMY N/A 02/18/2013   Procedure: EXPLORATORY LAPAROTOMY/Closure of Wound;  Surgeon: [name], MD;  Location: [facility];  Service: General;  Laterality: N/A;  . LAPAROTOMY N/A 04/19/2016   Procedure: EXPLORATORY LAPAROTOMY, REPAIR OF ANASTAMOTIC LEAK;  Surgeon: Judeth Horn, MD;   Location: [facility];  Service: General;  Laterality: N/A;  . LEFT HEART CATHETERIZATION WITH CORONARY ANGIOGRAM N/A 06/11/2014   Procedure: LEFT HEART CATHETERIZATION WITH CORONARY ANGIOGRAM;  Surgeon: [name], MD; CFX calcified, 30-40 percent, RCA calcified, 40/50/40%, PDA diffuse disease, LAD 40/75/90% s/p DES 2        Home Medications    Prior to Admission medications   Medication Sig Start Date End Date Taking? Authorizing Provider  aspirin EC 81 MG tablet Take 1 tablet (81 mg total) by mouth daily. 03/06/13   Love, Ivan Anchors, PA-C  atorvastatin (LIPITOR) 40 MG tablet Take 1 tablet (40 mg total) by mouth daily. 09/20/16   Burtis Junes, NP  clopidogrel (PLAVIX) 75 MG tablet TAKE 1 TABLET (75 MG TOTAL) BY MOUTH DAILY WITH BREAKFAST. 09/20/16   Burtis Junes, NP  diltiazem (CARDIZEM) 60 MG tablet Take 1 tablet (60 mg total) by mouth 2 (two) times daily. 09/20/16   Burtis Junes, NP  ferrous  sulfate 325 (65 FE) MG tablet Take 1 tablet (325 mg total) by mouth daily with breakfast. 01/15/14   Burtis Junes, NP  furosemide (LASIX) 40 MG tablet Take 1 tablet (40 mg total) by mouth daily. 09/20/16   Burtis Junes, NP  levalbuterol (XOPENEX HFA) 45 MCG/ACT inhaler INHALE 2 PUFFS INTO THE LUNGS EVERY 4 (FOUR) HOURS AS NEEDED FOR WHEEZING. 12/21/15   Burtis Junes, NP  metoprolol tartrate (LOPRESSOR) 25 MG tablet Take 1.5 tablets (37.5 mg total) by mouth 2 (two) times daily. 09/20/16   Burtis Junes, NP  Multiple Vitamins-Minerals (MULTIVITAMIN WITH MINERALS) tablet Take 1 tablet by mouth daily.    [provider]  nitroGLYCERIN (NITROSTAT) 0.4 MG SL tablet Place 0.4 mg under the tongue every 5 (five) minutes as needed for chest pain.    [provider]  ondansetron (ZOFRAN) 8 MG tablet TAKE 1 TABLET EVERY 6 HOURS AS NEEDED FOR NAUSEA AND VOMITING 07/22/16   [provider]  simethicone (MYLICON) 056 MG chewable tablet Chew 125 mg by mouth every 6 (six) hours  as needed for flatulence.    [provider]    Family History Family History  Problem Relation Age of Onset  . Hypertension Father   . Heart disease Father        before age 17  . Diabetes Father   . Hypertension Mother   . Dementia Mother   . Parkinsonism Mother   . Liver cancer Maternal Grandmother        dx in her 57s  . Prostate cancer Maternal Uncle   . Leukemia Maternal Uncle     Social History Social History  Substance Use Topics  . Smoking status: Former Smoker    Packs/day: 1.00    Years: 38.00    Types: Cigarettes    Quit date: 07/15/2008  . Smokeless tobacco: Never Used  . Alcohol use 0.0 oz/week     Comment: none snice colostomy placed     Allergies   No known allergies   Review of Systems Review of Systems  All other systems reviewed and are negative.    Physical Exam Updated Vital Signs BP 111/69 (BP Location: Left Arm)   Pulse 91   Temp 98.4 F (36.9 C) (Oral)   Resp 16   SpO2 100%   Physical Exam  Constitutional: He is oriented to person, place, and time. He appears well-developed and well-nourished.  Non-toxic appearance. No distress.  HENT:  Head: Normocephalic and atraumatic.  Eyes: Pupils are equal, round, and reactive to light. Conjunctivae, EOM and lids are normal.  Neck: Normal range of motion. Neck supple. No tracheal deviation present. No thyroid mass present.  Cardiovascular: Normal rate, regular rhythm and normal heart sounds.  Exam reveals no gallop.   No murmur heard. Pulmonary/Chest: Effort normal and breath sounds normal. No stridor. No respiratory distress. He has no decreased breath sounds. He has no wheezes. He has no rhonchi. He has no rales.  Abdominal: Soft. Normal appearance and bowel sounds are normal. He exhibits no distension. There is no tenderness. There is no rebound and no CVA tenderness.    Musculoskeletal: Normal range of motion. He exhibits no edema or tenderness.  Neurological: He is alert and  oriented to person, place, and time. No cranial nerve deficit or sensory deficit. GCS eye subscore is 4. GCS verbal subscore is 5. GCS motor subscore is 6.  Skin: Skin is warm and dry. No abrasion and no rash  noted.  Psychiatric: He has a normal mood and affect. His speech is normal and behavior is normal.  Nursing note and vitals reviewed.    ED Treatments / Results  Labs (all labs ordered are listed, but only abnormal results are displayed) Labs Reviewed  CBC - Abnormal; Notable for the following:       Result Value   RBC 3.75 (*)    Hemoglobin 10.4 (*)    HCT 33.5 (*)    RDW 15.8 (*)    All other components within normal limits  COMPREHENSIVE METABOLIC PANEL  URINALYSIS, ROUTINE W REFLEX MICROSCOPIC  TYPE AND SCREEN    EKG  EKG Interpretation None       Radiology No results found.  Procedures Procedures (including critical care time)  Medications Ordered in ED Medications  lidocaine (PF) (XYLOCAINE) 1 % injection (not administered)  lidocaine (PF) (XYLOCAINE) 1 % injection 30 mL (not administered)     Initial Impression / Assessment and Plan / ED Course  I have reviewed the triage vital signs and the nursing notes.  Pertinent labs & imaging results that were available during my care of the patient were reviewed by me and considered in my medical decision making (see chart for details).     LACERATION REPAIR Performed by: Leota Jacobsen Authorized by: Leota Jacobsen Consent: Verbal consent obtained. Risks and benefits: risks, benefits and alternatives were discussed Consent given by: patient Patient identity confirmed: provided demographic data Prepped and Draped in normal sterile fashion Wound explored  Laceration Location: Abdominal wall  Laceration Length: .2cm  No Foreign Bodies seen or palpated  Anesthesia: local infiltration  Local anesthetic: lidocaine 1% no epinephrine  Anesthetic total: 5 ml  Irrigation method: syringe Amount of  cleaning: standard  Skin closure:   Number of sutures: 4  Technique: Figure-of-eight   Patient tolerance: Patient tolerated the procedure well with no immediate complications.  Final Clinical Impressions(s) / ED Diagnoses   Final diagnoses:  None   Patient's hemoglobin stable. Bleeding has stopped. Follow-up and Return precautions given New Prescriptions New Prescriptions   No medications on file     Lacretia Leigh, MD 10/06/16 1840

## 2016-10-06 NOTE — ED Triage Notes (Signed)
Pt here for bleeding from open abd wound x 1 week intermittently; bright red blood with clots noted to abd pad at present; pt family sts today more blood than other events; pt appears pale unsure of baseline

## 2016-10-06 NOTE — Discharge Instructions (Signed)
Please have the sutures removed in 7 days. You may remove the dressing tomorrow. Return here for any problems

## 2016-10-06 NOTE — ED Notes (Signed)
ED Provider at bedside. 

## 2016-10-08 ENCOUNTER — Other Ambulatory Visit: Payer: Self-pay | Admitting: Nurse Practitioner

## 2016-10-10 DIAGNOSIS — Z430 Encounter for attention to tracheostomy: Secondary | ICD-10-CM | POA: Diagnosis not present

## 2016-10-10 DIAGNOSIS — I5042 Chronic combined systolic (congestive) and diastolic (congestive) heart failure: Secondary | ICD-10-CM | POA: Diagnosis not present

## 2016-10-10 DIAGNOSIS — I130 Hypertensive heart and chronic kidney disease with heart failure and stage 1 through stage 4 chronic kidney disease, or unspecified chronic kidney disease: Secondary | ICD-10-CM | POA: Diagnosis not present

## 2016-10-10 DIAGNOSIS — I2510 Atherosclerotic heart disease of native coronary artery without angina pectoris: Secondary | ICD-10-CM | POA: Diagnosis not present

## 2016-10-10 DIAGNOSIS — Z48815 Encounter for surgical aftercare following surgery on the digestive system: Secondary | ICD-10-CM | POA: Diagnosis not present

## 2016-10-10 DIAGNOSIS — Z433 Encounter for attention to colostomy: Secondary | ICD-10-CM | POA: Diagnosis not present

## 2016-10-18 DIAGNOSIS — I2510 Atherosclerotic heart disease of native coronary artery without angina pectoris: Secondary | ICD-10-CM | POA: Diagnosis not present

## 2016-10-18 DIAGNOSIS — I5042 Chronic combined systolic (congestive) and diastolic (congestive) heart failure: Secondary | ICD-10-CM | POA: Diagnosis not present

## 2016-10-18 DIAGNOSIS — Z433 Encounter for attention to colostomy: Secondary | ICD-10-CM | POA: Diagnosis not present

## 2016-10-18 DIAGNOSIS — Z48815 Encounter for surgical aftercare following surgery on the digestive system: Secondary | ICD-10-CM | POA: Diagnosis not present

## 2016-10-18 DIAGNOSIS — I130 Hypertensive heart and chronic kidney disease with heart failure and stage 1 through stage 4 chronic kidney disease, or unspecified chronic kidney disease: Secondary | ICD-10-CM | POA: Diagnosis not present

## 2016-10-18 DIAGNOSIS — Z430 Encounter for attention to tracheostomy: Secondary | ICD-10-CM | POA: Diagnosis not present

## 2016-10-21 ENCOUNTER — Telehealth: Payer: Self-pay | Admitting: Nurse Practitioner

## 2016-10-21 DIAGNOSIS — Z48815 Encounter for surgical aftercare following surgery on the digestive system: Secondary | ICD-10-CM | POA: Diagnosis not present

## 2016-10-21 DIAGNOSIS — I130 Hypertensive heart and chronic kidney disease with heart failure and stage 1 through stage 4 chronic kidney disease, or unspecified chronic kidney disease: Secondary | ICD-10-CM | POA: Diagnosis not present

## 2016-10-21 DIAGNOSIS — Z433 Encounter for attention to colostomy: Secondary | ICD-10-CM | POA: Diagnosis not present

## 2016-10-21 DIAGNOSIS — I5042 Chronic combined systolic (congestive) and diastolic (congestive) heart failure: Secondary | ICD-10-CM | POA: Diagnosis not present

## 2016-10-21 DIAGNOSIS — I2510 Atherosclerotic heart disease of native coronary artery without angina pectoris: Secondary | ICD-10-CM | POA: Diagnosis not present

## 2016-10-21 DIAGNOSIS — Z430 Encounter for attention to tracheostomy: Secondary | ICD-10-CM | POA: Diagnosis not present

## 2016-10-21 NOTE — Telephone Encounter (Signed)
New message      Pt c/o medication issue:  1. Name of Medication:  lasix  2. How are you currently taking this medication (dosage and times per day)?  40mg  daily 3. Are you having a reaction (difficulty breathing--STAT)? no 4. What is your medication issue?  Calling to confirm patient is to be taking this medication.  It is not on her med list

## 2016-10-21 NOTE — Telephone Encounter (Signed)
Called [name] with [facility] and confirmed that Lasix is on patient's medication list. Patient is suppose to be taking lasix 40 mg by mouth daily. [name] verbalized understanding.

## 2016-10-27 DIAGNOSIS — I5042 Chronic combined systolic (congestive) and diastolic (congestive) heart failure: Secondary | ICD-10-CM | POA: Diagnosis not present

## 2016-10-27 DIAGNOSIS — Z48815 Encounter for surgical aftercare following surgery on the digestive system: Secondary | ICD-10-CM | POA: Diagnosis not present

## 2016-10-27 DIAGNOSIS — I2510 Atherosclerotic heart disease of native coronary artery without angina pectoris: Secondary | ICD-10-CM | POA: Diagnosis not present

## 2016-10-27 DIAGNOSIS — Z433 Encounter for attention to colostomy: Secondary | ICD-10-CM | POA: Diagnosis not present

## 2016-10-27 DIAGNOSIS — I130 Hypertensive heart and chronic kidney disease with heart failure and stage 1 through stage 4 chronic kidney disease, or unspecified chronic kidney disease: Secondary | ICD-10-CM | POA: Diagnosis not present

## 2016-10-27 DIAGNOSIS — Z430 Encounter for attention to tracheostomy: Secondary | ICD-10-CM | POA: Diagnosis not present

## 2016-11-01 DIAGNOSIS — Z433 Encounter for attention to colostomy: Secondary | ICD-10-CM | POA: Diagnosis not present

## 2016-11-01 DIAGNOSIS — I5042 Chronic combined systolic (congestive) and diastolic (congestive) heart failure: Secondary | ICD-10-CM | POA: Diagnosis not present

## 2016-11-01 DIAGNOSIS — Z48815 Encounter for surgical aftercare following surgery on the digestive system: Secondary | ICD-10-CM | POA: Diagnosis not present

## 2016-11-01 DIAGNOSIS — I2510 Atherosclerotic heart disease of native coronary artery without angina pectoris: Secondary | ICD-10-CM | POA: Diagnosis not present

## 2016-11-01 DIAGNOSIS — Z430 Encounter for attention to tracheostomy: Secondary | ICD-10-CM | POA: Diagnosis not present

## 2016-11-01 DIAGNOSIS — I130 Hypertensive heart and chronic kidney disease with heart failure and stage 1 through stage 4 chronic kidney disease, or unspecified chronic kidney disease: Secondary | ICD-10-CM | POA: Diagnosis not present

## 2016-11-04 DIAGNOSIS — I130 Hypertensive heart and chronic kidney disease with heart failure and stage 1 through stage 4 chronic kidney disease, or unspecified chronic kidney disease: Secondary | ICD-10-CM | POA: Diagnosis not present

## 2016-11-04 DIAGNOSIS — Z430 Encounter for attention to tracheostomy: Secondary | ICD-10-CM | POA: Diagnosis not present

## 2016-11-04 DIAGNOSIS — Z48815 Encounter for surgical aftercare following surgery on the digestive system: Secondary | ICD-10-CM | POA: Diagnosis not present

## 2016-11-04 DIAGNOSIS — I5042 Chronic combined systolic (congestive) and diastolic (congestive) heart failure: Secondary | ICD-10-CM | POA: Diagnosis not present

## 2016-11-04 DIAGNOSIS — Z433 Encounter for attention to colostomy: Secondary | ICD-10-CM | POA: Diagnosis not present

## 2016-11-04 DIAGNOSIS — I2510 Atherosclerotic heart disease of native coronary artery without angina pectoris: Secondary | ICD-10-CM | POA: Diagnosis not present

## 2016-11-08 ENCOUNTER — Telehealth: Payer: Self-pay | Admitting: Nurse Practitioner

## 2016-11-08 ENCOUNTER — Other Ambulatory Visit: Payer: Self-pay | Admitting: *Deleted

## 2016-11-08 DIAGNOSIS — Z433 Encounter for attention to colostomy: Secondary | ICD-10-CM | POA: Diagnosis not present

## 2016-11-08 DIAGNOSIS — I130 Hypertensive heart and chronic kidney disease with heart failure and stage 1 through stage 4 chronic kidney disease, or unspecified chronic kidney disease: Secondary | ICD-10-CM | POA: Diagnosis not present

## 2016-11-08 DIAGNOSIS — Z48815 Encounter for surgical aftercare following surgery on the digestive system: Secondary | ICD-10-CM | POA: Diagnosis not present

## 2016-11-08 DIAGNOSIS — I5042 Chronic combined systolic (congestive) and diastolic (congestive) heart failure: Secondary | ICD-10-CM | POA: Diagnosis not present

## 2016-11-08 DIAGNOSIS — Z430 Encounter for attention to tracheostomy: Secondary | ICD-10-CM | POA: Diagnosis not present

## 2016-11-08 DIAGNOSIS — I2510 Atherosclerotic heart disease of native coronary artery without angina pectoris: Secondary | ICD-10-CM | POA: Diagnosis not present

## 2016-11-08 MED ORDER — DILTIAZEM HCL 60 MG PO TABS: 60.0000 mg | ORAL_TABLET | Freq: Three times a day (TID) | ORAL | 3 refills | Status: DC

## 2016-11-08 NOTE — Telephone Encounter (Signed)
Would go ahead and increase his Diltiazem to 60 mg three times a day (was on BID)

## 2016-11-08 NOTE — Telephone Encounter (Signed)
S/w pt is aware of Lori's increase in cardizem to tid.  Medication list updated.

## 2016-11-08 NOTE — Telephone Encounter (Signed)
New message    Patient c/o Palpitations:  High priority if patient c/o lightheadedness and shortness of breath.  1. How long have you been having palpitations? Today   2. Are you currently experiencing lightheadedness and shortness of breath? No   3. Have you checked your BP and heart rate? (document readings) bp-118/78 p- 110  4. Are you experiencing any other symptoms? No   Home health nurse wanted to let Cecille Rubin know that pt HR is 110 today and it's normally under 100.

## 2016-11-11 DIAGNOSIS — Z48815 Encounter for surgical aftercare following surgery on the digestive system: Secondary | ICD-10-CM | POA: Diagnosis not present

## 2016-11-11 DIAGNOSIS — I5042 Chronic combined systolic (congestive) and diastolic (congestive) heart failure: Secondary | ICD-10-CM | POA: Diagnosis not present

## 2016-11-11 DIAGNOSIS — I2510 Atherosclerotic heart disease of native coronary artery without angina pectoris: Secondary | ICD-10-CM | POA: Diagnosis not present

## 2016-11-11 DIAGNOSIS — I130 Hypertensive heart and chronic kidney disease with heart failure and stage 1 through stage 4 chronic kidney disease, or unspecified chronic kidney disease: Secondary | ICD-10-CM | POA: Diagnosis not present

## 2016-11-11 DIAGNOSIS — Z433 Encounter for attention to colostomy: Secondary | ICD-10-CM | POA: Diagnosis not present

## 2016-11-11 DIAGNOSIS — Z430 Encounter for attention to tracheostomy: Secondary | ICD-10-CM | POA: Diagnosis not present

## 2016-11-11 NOTE — Telephone Encounter (Signed)
Spoke to [employer] with [facility].She stated since Diltiazem increased to 60 mg three times a day pulse is better 102 today.Stated patient's weight has increased 2 to 3 lbs this week.He has trace swelling in lower legs.He has been off his diet ,but for the past 1 week is following diet,but has gained weight.Advised he can double lasix dose for the next 3 days then return to normal dose.Bmet in 1 week.Stated she will continue to monitor patient,she will call back with bmet results and will call sooner if needed.

## 2016-11-11 NOTE — Telephone Encounter (Signed)
F/u     Per Langley Gauss the home health nurse ,calling to report patient heartrate is 102-105 today,weight is  325.4 from 320lbs. Patient is taking 40mg  lasix once a day. Please advise.  (smartphrase not loaded yet)

## 2016-11-13 NOTE — Telephone Encounter (Signed)
Agree 

## 2016-11-15 DIAGNOSIS — Z430 Encounter for attention to tracheostomy: Secondary | ICD-10-CM | POA: Diagnosis not present

## 2016-11-15 DIAGNOSIS — Z48815 Encounter for surgical aftercare following surgery on the digestive system: Secondary | ICD-10-CM | POA: Diagnosis not present

## 2016-11-15 DIAGNOSIS — Z433 Encounter for attention to colostomy: Secondary | ICD-10-CM | POA: Diagnosis not present

## 2016-11-15 DIAGNOSIS — I130 Hypertensive heart and chronic kidney disease with heart failure and stage 1 through stage 4 chronic kidney disease, or unspecified chronic kidney disease: Secondary | ICD-10-CM | POA: Diagnosis not present

## 2016-11-15 DIAGNOSIS — I5042 Chronic combined systolic (congestive) and diastolic (congestive) heart failure: Secondary | ICD-10-CM | POA: Diagnosis not present

## 2016-11-15 DIAGNOSIS — I2510 Atherosclerotic heart disease of native coronary artery without angina pectoris: Secondary | ICD-10-CM | POA: Diagnosis not present

## 2016-11-18 DIAGNOSIS — Z48815 Encounter for surgical aftercare following surgery on the digestive system: Secondary | ICD-10-CM | POA: Diagnosis not present

## 2016-11-18 DIAGNOSIS — I130 Hypertensive heart and chronic kidney disease with heart failure and stage 1 through stage 4 chronic kidney disease, or unspecified chronic kidney disease: Secondary | ICD-10-CM | POA: Diagnosis not present

## 2016-11-18 DIAGNOSIS — Z433 Encounter for attention to colostomy: Secondary | ICD-10-CM | POA: Diagnosis not present

## 2016-11-18 DIAGNOSIS — I5042 Chronic combined systolic (congestive) and diastolic (congestive) heart failure: Secondary | ICD-10-CM | POA: Diagnosis not present

## 2016-11-18 DIAGNOSIS — I2510 Atherosclerotic heart disease of native coronary artery without angina pectoris: Secondary | ICD-10-CM | POA: Diagnosis not present

## 2016-11-18 DIAGNOSIS — Z430 Encounter for attention to tracheostomy: Secondary | ICD-10-CM | POA: Diagnosis not present

## 2016-11-18 DIAGNOSIS — I509 Heart failure, unspecified: Secondary | ICD-10-CM | POA: Diagnosis not present

## 2016-11-20 DIAGNOSIS — Z48813 Encounter for surgical aftercare following surgery on the respiratory system: Secondary | ICD-10-CM | POA: Diagnosis not present

## 2016-11-20 DIAGNOSIS — Z433 Encounter for attention to colostomy: Secondary | ICD-10-CM | POA: Diagnosis not present

## 2016-11-20 DIAGNOSIS — I2510 Atherosclerotic heart disease of native coronary artery without angina pectoris: Secondary | ICD-10-CM | POA: Diagnosis not present

## 2016-11-20 DIAGNOSIS — N183 Chronic kidney disease, stage 3 (moderate): Secondary | ICD-10-CM | POA: Diagnosis not present

## 2016-11-20 DIAGNOSIS — I4891 Unspecified atrial fibrillation: Secondary | ICD-10-CM | POA: Diagnosis not present

## 2016-11-20 DIAGNOSIS — I130 Hypertensive heart and chronic kidney disease with heart failure and stage 1 through stage 4 chronic kidney disease, or unspecified chronic kidney disease: Secondary | ICD-10-CM | POA: Diagnosis not present

## 2016-11-20 DIAGNOSIS — Z6841 Body Mass Index (BMI) 40.0 and over, adult: Secondary | ICD-10-CM | POA: Diagnosis not present

## 2016-11-20 DIAGNOSIS — J449 Chronic obstructive pulmonary disease, unspecified: Secondary | ICD-10-CM | POA: Diagnosis not present

## 2016-11-20 DIAGNOSIS — Z48815 Encounter for surgical aftercare following surgery on the digestive system: Secondary | ICD-10-CM | POA: Diagnosis not present

## 2016-11-20 DIAGNOSIS — I739 Peripheral vascular disease, unspecified: Secondary | ICD-10-CM | POA: Diagnosis not present

## 2016-11-20 DIAGNOSIS — E669 Obesity, unspecified: Secondary | ICD-10-CM | POA: Diagnosis not present

## 2016-11-20 DIAGNOSIS — Z85038 Personal history of other malignant neoplasm of large intestine: Secondary | ICD-10-CM | POA: Diagnosis not present

## 2016-11-20 DIAGNOSIS — Z7902 Long term (current) use of antithrombotics/antiplatelets: Secondary | ICD-10-CM | POA: Diagnosis not present

## 2016-11-20 DIAGNOSIS — I5042 Chronic combined systolic (congestive) and diastolic (congestive) heart failure: Secondary | ICD-10-CM | POA: Diagnosis not present

## 2016-11-21 ENCOUNTER — Telehealth: Payer: Self-pay | Admitting: *Deleted

## 2016-11-21 NOTE — Telephone Encounter (Signed)
Faxing to [facility] health fax @ [phone], phone # is [phone] orders.

## 2016-11-22 ENCOUNTER — Telehealth: Payer: Self-pay | Admitting: Nurse Practitioner

## 2016-11-22 DIAGNOSIS — Z48813 Encounter for surgical aftercare following surgery on the respiratory system: Secondary | ICD-10-CM | POA: Diagnosis not present

## 2016-11-22 DIAGNOSIS — I130 Hypertensive heart and chronic kidney disease with heart failure and stage 1 through stage 4 chronic kidney disease, or unspecified chronic kidney disease: Secondary | ICD-10-CM | POA: Diagnosis not present

## 2016-11-22 DIAGNOSIS — Z433 Encounter for attention to colostomy: Secondary | ICD-10-CM | POA: Diagnosis not present

## 2016-11-22 DIAGNOSIS — I2510 Atherosclerotic heart disease of native coronary artery without angina pectoris: Secondary | ICD-10-CM | POA: Diagnosis not present

## 2016-11-22 DIAGNOSIS — Z48815 Encounter for surgical aftercare following surgery on the digestive system: Secondary | ICD-10-CM | POA: Diagnosis not present

## 2016-11-22 DIAGNOSIS — I5042 Chronic combined systolic (congestive) and diastolic (congestive) heart failure: Secondary | ICD-10-CM | POA: Diagnosis not present

## 2016-11-22 NOTE — Telephone Encounter (Signed)
Larry [name] will call [facility] and get recent bmet faxed to office.  When office gets results will call denise with results.

## 2016-11-22 NOTE — Telephone Encounter (Signed)
New Message     The lab is just going to fax to the medical records number but they would not put it to your attention.

## 2016-11-22 NOTE — Telephone Encounter (Signed)
S/w Denisa and stated already drew bmet on Friday is waiting for results.  Was sent to Dr. Rayann Heman.  Will investigate and get back to [place].

## 2016-11-22 NOTE — Telephone Encounter (Signed)
New Message  Langley Gauss from [facility] call requesting to speak with RN. She states she send labs in and would like the results. Please call back to discuss

## 2016-11-22 NOTE — Telephone Encounter (Signed)
He was to have a BMET since he took extra lasix.   I do not see that this has been done.   He can come here for it.

## 2016-11-25 DIAGNOSIS — I2510 Atherosclerotic heart disease of native coronary artery without angina pectoris: Secondary | ICD-10-CM | POA: Diagnosis not present

## 2016-11-25 DIAGNOSIS — Z48815 Encounter for surgical aftercare following surgery on the digestive system: Secondary | ICD-10-CM | POA: Diagnosis not present

## 2016-11-25 DIAGNOSIS — Z48813 Encounter for surgical aftercare following surgery on the respiratory system: Secondary | ICD-10-CM | POA: Diagnosis not present

## 2016-11-25 DIAGNOSIS — I130 Hypertensive heart and chronic kidney disease with heart failure and stage 1 through stage 4 chronic kidney disease, or unspecified chronic kidney disease: Secondary | ICD-10-CM | POA: Diagnosis not present

## 2016-11-25 DIAGNOSIS — Z433 Encounter for attention to colostomy: Secondary | ICD-10-CM | POA: Diagnosis not present

## 2016-11-25 DIAGNOSIS — I5042 Chronic combined systolic (congestive) and diastolic (congestive) heart failure: Secondary | ICD-10-CM | POA: Diagnosis not present

## 2016-11-29 DIAGNOSIS — Z48815 Encounter for surgical aftercare following surgery on the digestive system: Secondary | ICD-10-CM | POA: Diagnosis not present

## 2016-11-29 DIAGNOSIS — Z48813 Encounter for surgical aftercare following surgery on the respiratory system: Secondary | ICD-10-CM | POA: Diagnosis not present

## 2016-11-29 DIAGNOSIS — I130 Hypertensive heart and chronic kidney disease with heart failure and stage 1 through stage 4 chronic kidney disease, or unspecified chronic kidney disease: Secondary | ICD-10-CM | POA: Diagnosis not present

## 2016-11-29 DIAGNOSIS — Z433 Encounter for attention to colostomy: Secondary | ICD-10-CM | POA: Diagnosis not present

## 2016-11-29 DIAGNOSIS — I5042 Chronic combined systolic (congestive) and diastolic (congestive) heart failure: Secondary | ICD-10-CM | POA: Diagnosis not present

## 2016-11-29 DIAGNOSIS — I2510 Atherosclerotic heart disease of native coronary artery without angina pectoris: Secondary | ICD-10-CM | POA: Diagnosis not present

## 2016-12-02 ENCOUNTER — Telehealth: Payer: Self-pay | Admitting: Nurse Practitioner

## 2016-12-02 NOTE — Telephone Encounter (Signed)
That's ok. I am not overseeing his wound care however.

## 2016-12-02 NOTE — Telephone Encounter (Signed)
New message     Per [facility] care giver due to trees being down she is unable to make it to his house, his wife is going to care for the wound, they just wanted to have it documented on chart.

## 2016-12-09 DIAGNOSIS — I130 Hypertensive heart and chronic kidney disease with heart failure and stage 1 through stage 4 chronic kidney disease, or unspecified chronic kidney disease: Secondary | ICD-10-CM | POA: Diagnosis not present

## 2016-12-09 DIAGNOSIS — Z48813 Encounter for surgical aftercare following surgery on the respiratory system: Secondary | ICD-10-CM | POA: Diagnosis not present

## 2016-12-09 DIAGNOSIS — I5042 Chronic combined systolic (congestive) and diastolic (congestive) heart failure: Secondary | ICD-10-CM | POA: Diagnosis not present

## 2016-12-09 DIAGNOSIS — I2510 Atherosclerotic heart disease of native coronary artery without angina pectoris: Secondary | ICD-10-CM | POA: Diagnosis not present

## 2016-12-09 DIAGNOSIS — Z48815 Encounter for surgical aftercare following surgery on the digestive system: Secondary | ICD-10-CM | POA: Diagnosis not present

## 2016-12-09 DIAGNOSIS — Z433 Encounter for attention to colostomy: Secondary | ICD-10-CM | POA: Diagnosis not present

## 2016-12-13 DIAGNOSIS — Z48813 Encounter for surgical aftercare following surgery on the respiratory system: Secondary | ICD-10-CM | POA: Diagnosis not present

## 2016-12-13 DIAGNOSIS — Z48815 Encounter for surgical aftercare following surgery on the digestive system: Secondary | ICD-10-CM | POA: Diagnosis not present

## 2016-12-13 DIAGNOSIS — I130 Hypertensive heart and chronic kidney disease with heart failure and stage 1 through stage 4 chronic kidney disease, or unspecified chronic kidney disease: Secondary | ICD-10-CM | POA: Diagnosis not present

## 2016-12-13 DIAGNOSIS — Z433 Encounter for attention to colostomy: Secondary | ICD-10-CM | POA: Diagnosis not present

## 2016-12-13 DIAGNOSIS — I5042 Chronic combined systolic (congestive) and diastolic (congestive) heart failure: Secondary | ICD-10-CM | POA: Diagnosis not present

## 2016-12-13 DIAGNOSIS — I2510 Atherosclerotic heart disease of native coronary artery without angina pectoris: Secondary | ICD-10-CM | POA: Diagnosis not present

## 2016-12-16 DIAGNOSIS — I5042 Chronic combined systolic (congestive) and diastolic (congestive) heart failure: Secondary | ICD-10-CM | POA: Diagnosis not present

## 2016-12-16 DIAGNOSIS — Z48815 Encounter for surgical aftercare following surgery on the digestive system: Secondary | ICD-10-CM | POA: Diagnosis not present

## 2016-12-16 DIAGNOSIS — I130 Hypertensive heart and chronic kidney disease with heart failure and stage 1 through stage 4 chronic kidney disease, or unspecified chronic kidney disease: Secondary | ICD-10-CM | POA: Diagnosis not present

## 2016-12-16 DIAGNOSIS — Z48813 Encounter for surgical aftercare following surgery on the respiratory system: Secondary | ICD-10-CM | POA: Diagnosis not present

## 2016-12-16 DIAGNOSIS — I2510 Atherosclerotic heart disease of native coronary artery without angina pectoris: Secondary | ICD-10-CM | POA: Diagnosis not present

## 2016-12-16 DIAGNOSIS — Z433 Encounter for attention to colostomy: Secondary | ICD-10-CM | POA: Diagnosis not present

## 2016-12-20 DIAGNOSIS — Z48815 Encounter for surgical aftercare following surgery on the digestive system: Secondary | ICD-10-CM | POA: Diagnosis not present

## 2016-12-20 DIAGNOSIS — I5042 Chronic combined systolic (congestive) and diastolic (congestive) heart failure: Secondary | ICD-10-CM | POA: Diagnosis not present

## 2016-12-20 DIAGNOSIS — I2510 Atherosclerotic heart disease of native coronary artery without angina pectoris: Secondary | ICD-10-CM | POA: Diagnosis not present

## 2016-12-20 DIAGNOSIS — Z48813 Encounter for surgical aftercare following surgery on the respiratory system: Secondary | ICD-10-CM | POA: Diagnosis not present

## 2016-12-20 DIAGNOSIS — I130 Hypertensive heart and chronic kidney disease with heart failure and stage 1 through stage 4 chronic kidney disease, or unspecified chronic kidney disease: Secondary | ICD-10-CM | POA: Diagnosis not present

## 2016-12-20 DIAGNOSIS — Z433 Encounter for attention to colostomy: Secondary | ICD-10-CM | POA: Diagnosis not present

## 2016-12-23 DIAGNOSIS — Z48813 Encounter for surgical aftercare following surgery on the respiratory system: Secondary | ICD-10-CM | POA: Diagnosis not present

## 2016-12-23 DIAGNOSIS — I2510 Atherosclerotic heart disease of native coronary artery without angina pectoris: Secondary | ICD-10-CM | POA: Diagnosis not present

## 2016-12-23 DIAGNOSIS — Z48815 Encounter for surgical aftercare following surgery on the digestive system: Secondary | ICD-10-CM | POA: Diagnosis not present

## 2016-12-23 DIAGNOSIS — Z433 Encounter for attention to colostomy: Secondary | ICD-10-CM | POA: Diagnosis not present

## 2016-12-23 DIAGNOSIS — I130 Hypertensive heart and chronic kidney disease with heart failure and stage 1 through stage 4 chronic kidney disease, or unspecified chronic kidney disease: Secondary | ICD-10-CM | POA: Diagnosis not present

## 2016-12-23 DIAGNOSIS — I5042 Chronic combined systolic (congestive) and diastolic (congestive) heart failure: Secondary | ICD-10-CM | POA: Diagnosis not present

## 2016-12-27 DIAGNOSIS — I130 Hypertensive heart and chronic kidney disease with heart failure and stage 1 through stage 4 chronic kidney disease, or unspecified chronic kidney disease: Secondary | ICD-10-CM | POA: Diagnosis not present

## 2016-12-27 DIAGNOSIS — Z433 Encounter for attention to colostomy: Secondary | ICD-10-CM | POA: Diagnosis not present

## 2016-12-27 DIAGNOSIS — I2510 Atherosclerotic heart disease of native coronary artery without angina pectoris: Secondary | ICD-10-CM | POA: Diagnosis not present

## 2016-12-27 DIAGNOSIS — Z48813 Encounter for surgical aftercare following surgery on the respiratory system: Secondary | ICD-10-CM | POA: Diagnosis not present

## 2016-12-27 DIAGNOSIS — Z48815 Encounter for surgical aftercare following surgery on the digestive system: Secondary | ICD-10-CM | POA: Diagnosis not present

## 2016-12-27 DIAGNOSIS — I5042 Chronic combined systolic (congestive) and diastolic (congestive) heart failure: Secondary | ICD-10-CM | POA: Diagnosis not present

## 2016-12-30 DIAGNOSIS — I2510 Atherosclerotic heart disease of native coronary artery without angina pectoris: Secondary | ICD-10-CM | POA: Diagnosis not present

## 2016-12-30 DIAGNOSIS — Z433 Encounter for attention to colostomy: Secondary | ICD-10-CM | POA: Diagnosis not present

## 2016-12-30 DIAGNOSIS — Z48813 Encounter for surgical aftercare following surgery on the respiratory system: Secondary | ICD-10-CM | POA: Diagnosis not present

## 2016-12-30 DIAGNOSIS — I5042 Chronic combined systolic (congestive) and diastolic (congestive) heart failure: Secondary | ICD-10-CM | POA: Diagnosis not present

## 2016-12-30 DIAGNOSIS — I130 Hypertensive heart and chronic kidney disease with heart failure and stage 1 through stage 4 chronic kidney disease, or unspecified chronic kidney disease: Secondary | ICD-10-CM | POA: Diagnosis not present

## 2016-12-30 DIAGNOSIS — Z48815 Encounter for surgical aftercare following surgery on the digestive system: Secondary | ICD-10-CM | POA: Diagnosis not present

## 2017-01-03 DIAGNOSIS — I5042 Chronic combined systolic (congestive) and diastolic (congestive) heart failure: Secondary | ICD-10-CM | POA: Diagnosis not present

## 2017-01-03 DIAGNOSIS — Z433 Encounter for attention to colostomy: Secondary | ICD-10-CM | POA: Diagnosis not present

## 2017-01-03 DIAGNOSIS — Z48813 Encounter for surgical aftercare following surgery on the respiratory system: Secondary | ICD-10-CM | POA: Diagnosis not present

## 2017-01-03 DIAGNOSIS — Z48815 Encounter for surgical aftercare following surgery on the digestive system: Secondary | ICD-10-CM | POA: Diagnosis not present

## 2017-01-03 DIAGNOSIS — I130 Hypertensive heart and chronic kidney disease with heart failure and stage 1 through stage 4 chronic kidney disease, or unspecified chronic kidney disease: Secondary | ICD-10-CM | POA: Diagnosis not present

## 2017-01-03 DIAGNOSIS — I2510 Atherosclerotic heart disease of native coronary artery without angina pectoris: Secondary | ICD-10-CM | POA: Diagnosis not present

## 2017-01-06 DIAGNOSIS — I2510 Atherosclerotic heart disease of native coronary artery without angina pectoris: Secondary | ICD-10-CM | POA: Diagnosis not present

## 2017-01-06 DIAGNOSIS — I130 Hypertensive heart and chronic kidney disease with heart failure and stage 1 through stage 4 chronic kidney disease, or unspecified chronic kidney disease: Secondary | ICD-10-CM | POA: Diagnosis not present

## 2017-01-06 DIAGNOSIS — Z48815 Encounter for surgical aftercare following surgery on the digestive system: Secondary | ICD-10-CM | POA: Diagnosis not present

## 2017-01-06 DIAGNOSIS — I5042 Chronic combined systolic (congestive) and diastolic (congestive) heart failure: Secondary | ICD-10-CM | POA: Diagnosis not present

## 2017-01-06 DIAGNOSIS — Z433 Encounter for attention to colostomy: Secondary | ICD-10-CM | POA: Diagnosis not present

## 2017-01-06 DIAGNOSIS — Z48813 Encounter for surgical aftercare following surgery on the respiratory system: Secondary | ICD-10-CM | POA: Diagnosis not present

## 2017-01-10 DIAGNOSIS — Z433 Encounter for attention to colostomy: Secondary | ICD-10-CM | POA: Diagnosis not present

## 2017-01-10 DIAGNOSIS — Z48813 Encounter for surgical aftercare following surgery on the respiratory system: Secondary | ICD-10-CM | POA: Diagnosis not present

## 2017-01-10 DIAGNOSIS — I130 Hypertensive heart and chronic kidney disease with heart failure and stage 1 through stage 4 chronic kidney disease, or unspecified chronic kidney disease: Secondary | ICD-10-CM | POA: Diagnosis not present

## 2017-01-10 DIAGNOSIS — I2510 Atherosclerotic heart disease of native coronary artery without angina pectoris: Secondary | ICD-10-CM | POA: Diagnosis not present

## 2017-01-10 DIAGNOSIS — I5042 Chronic combined systolic (congestive) and diastolic (congestive) heart failure: Secondary | ICD-10-CM | POA: Diagnosis not present

## 2017-01-10 DIAGNOSIS — Z48815 Encounter for surgical aftercare following surgery on the digestive system: Secondary | ICD-10-CM | POA: Diagnosis not present

## 2017-01-11 DIAGNOSIS — I5042 Chronic combined systolic (congestive) and diastolic (congestive) heart failure: Secondary | ICD-10-CM | POA: Diagnosis not present

## 2017-01-11 DIAGNOSIS — Z433 Encounter for attention to colostomy: Secondary | ICD-10-CM | POA: Diagnosis not present

## 2017-01-11 DIAGNOSIS — Z48813 Encounter for surgical aftercare following surgery on the respiratory system: Secondary | ICD-10-CM | POA: Diagnosis not present

## 2017-01-11 DIAGNOSIS — Z48815 Encounter for surgical aftercare following surgery on the digestive system: Secondary | ICD-10-CM | POA: Diagnosis not present

## 2017-01-11 DIAGNOSIS — I130 Hypertensive heart and chronic kidney disease with heart failure and stage 1 through stage 4 chronic kidney disease, or unspecified chronic kidney disease: Secondary | ICD-10-CM | POA: Diagnosis not present

## 2017-01-11 DIAGNOSIS — I2510 Atherosclerotic heart disease of native coronary artery without angina pectoris: Secondary | ICD-10-CM | POA: Diagnosis not present

## 2017-01-17 DIAGNOSIS — I2510 Atherosclerotic heart disease of native coronary artery without angina pectoris: Secondary | ICD-10-CM | POA: Diagnosis not present

## 2017-01-17 DIAGNOSIS — Z433 Encounter for attention to colostomy: Secondary | ICD-10-CM | POA: Diagnosis not present

## 2017-01-17 DIAGNOSIS — Z48815 Encounter for surgical aftercare following surgery on the digestive system: Secondary | ICD-10-CM | POA: Diagnosis not present

## 2017-01-17 DIAGNOSIS — I5042 Chronic combined systolic (congestive) and diastolic (congestive) heart failure: Secondary | ICD-10-CM | POA: Diagnosis not present

## 2017-01-17 DIAGNOSIS — I130 Hypertensive heart and chronic kidney disease with heart failure and stage 1 through stage 4 chronic kidney disease, or unspecified chronic kidney disease: Secondary | ICD-10-CM | POA: Diagnosis not present

## 2017-01-17 DIAGNOSIS — Z48813 Encounter for surgical aftercare following surgery on the respiratory system: Secondary | ICD-10-CM | POA: Diagnosis not present

## 2017-01-19 DIAGNOSIS — N183 Chronic kidney disease, stage 3 (moderate): Secondary | ICD-10-CM | POA: Diagnosis not present

## 2017-01-19 DIAGNOSIS — Z6841 Body Mass Index (BMI) 40.0 and over, adult: Secondary | ICD-10-CM | POA: Diagnosis not present

## 2017-01-19 DIAGNOSIS — Z85038 Personal history of other malignant neoplasm of large intestine: Secondary | ICD-10-CM | POA: Diagnosis not present

## 2017-01-19 DIAGNOSIS — Z7902 Long term (current) use of antithrombotics/antiplatelets: Secondary | ICD-10-CM | POA: Diagnosis not present

## 2017-01-19 DIAGNOSIS — I2510 Atherosclerotic heart disease of native coronary artery without angina pectoris: Secondary | ICD-10-CM | POA: Diagnosis not present

## 2017-01-19 DIAGNOSIS — Z433 Encounter for attention to colostomy: Secondary | ICD-10-CM | POA: Diagnosis not present

## 2017-01-19 DIAGNOSIS — I5042 Chronic combined systolic (congestive) and diastolic (congestive) heart failure: Secondary | ICD-10-CM | POA: Diagnosis not present

## 2017-01-19 DIAGNOSIS — I4891 Unspecified atrial fibrillation: Secondary | ICD-10-CM | POA: Diagnosis not present

## 2017-01-19 DIAGNOSIS — J449 Chronic obstructive pulmonary disease, unspecified: Secondary | ICD-10-CM | POA: Diagnosis not present

## 2017-01-19 DIAGNOSIS — E669 Obesity, unspecified: Secondary | ICD-10-CM | POA: Diagnosis not present

## 2017-01-19 DIAGNOSIS — Z48815 Encounter for surgical aftercare following surgery on the digestive system: Secondary | ICD-10-CM | POA: Diagnosis not present

## 2017-01-19 DIAGNOSIS — I739 Peripheral vascular disease, unspecified: Secondary | ICD-10-CM | POA: Diagnosis not present

## 2017-01-19 DIAGNOSIS — Z48813 Encounter for surgical aftercare following surgery on the respiratory system: Secondary | ICD-10-CM | POA: Diagnosis not present

## 2017-01-19 DIAGNOSIS — Z7982 Long term (current) use of aspirin: Secondary | ICD-10-CM | POA: Diagnosis not present

## 2017-01-19 DIAGNOSIS — I130 Hypertensive heart and chronic kidney disease with heart failure and stage 1 through stage 4 chronic kidney disease, or unspecified chronic kidney disease: Secondary | ICD-10-CM | POA: Diagnosis not present

## 2017-01-20 ENCOUNTER — Encounter: Payer: Self-pay | Admitting: Nurse Practitioner

## 2017-01-20 DIAGNOSIS — I130 Hypertensive heart and chronic kidney disease with heart failure and stage 1 through stage 4 chronic kidney disease, or unspecified chronic kidney disease: Secondary | ICD-10-CM | POA: Diagnosis not present

## 2017-01-20 DIAGNOSIS — Z433 Encounter for attention to colostomy: Secondary | ICD-10-CM | POA: Diagnosis not present

## 2017-01-20 DIAGNOSIS — I5042 Chronic combined systolic (congestive) and diastolic (congestive) heart failure: Secondary | ICD-10-CM | POA: Diagnosis not present

## 2017-01-20 DIAGNOSIS — Z48813 Encounter for surgical aftercare following surgery on the respiratory system: Secondary | ICD-10-CM | POA: Diagnosis not present

## 2017-01-20 DIAGNOSIS — Z48815 Encounter for surgical aftercare following surgery on the digestive system: Secondary | ICD-10-CM | POA: Diagnosis not present

## 2017-01-20 DIAGNOSIS — I2510 Atherosclerotic heart disease of native coronary artery without angina pectoris: Secondary | ICD-10-CM | POA: Diagnosis not present

## 2017-01-24 DIAGNOSIS — I130 Hypertensive heart and chronic kidney disease with heart failure and stage 1 through stage 4 chronic kidney disease, or unspecified chronic kidney disease: Secondary | ICD-10-CM | POA: Diagnosis not present

## 2017-01-24 DIAGNOSIS — Z48813 Encounter for surgical aftercare following surgery on the respiratory system: Secondary | ICD-10-CM | POA: Diagnosis not present

## 2017-01-24 DIAGNOSIS — Z48815 Encounter for surgical aftercare following surgery on the digestive system: Secondary | ICD-10-CM | POA: Diagnosis not present

## 2017-01-24 DIAGNOSIS — I2510 Atherosclerotic heart disease of native coronary artery without angina pectoris: Secondary | ICD-10-CM | POA: Diagnosis not present

## 2017-01-24 DIAGNOSIS — Z433 Encounter for attention to colostomy: Secondary | ICD-10-CM | POA: Diagnosis not present

## 2017-01-24 DIAGNOSIS — I5042 Chronic combined systolic (congestive) and diastolic (congestive) heart failure: Secondary | ICD-10-CM | POA: Diagnosis not present

## 2017-01-27 DIAGNOSIS — Z433 Encounter for attention to colostomy: Secondary | ICD-10-CM | POA: Diagnosis not present

## 2017-01-27 DIAGNOSIS — Z48813 Encounter for surgical aftercare following surgery on the respiratory system: Secondary | ICD-10-CM | POA: Diagnosis not present

## 2017-01-27 DIAGNOSIS — Z48815 Encounter for surgical aftercare following surgery on the digestive system: Secondary | ICD-10-CM | POA: Diagnosis not present

## 2017-01-27 DIAGNOSIS — I5042 Chronic combined systolic (congestive) and diastolic (congestive) heart failure: Secondary | ICD-10-CM | POA: Diagnosis not present

## 2017-01-27 DIAGNOSIS — I130 Hypertensive heart and chronic kidney disease with heart failure and stage 1 through stage 4 chronic kidney disease, or unspecified chronic kidney disease: Secondary | ICD-10-CM | POA: Diagnosis not present

## 2017-01-27 DIAGNOSIS — I2510 Atherosclerotic heart disease of native coronary artery without angina pectoris: Secondary | ICD-10-CM | POA: Diagnosis not present

## 2017-01-30 ENCOUNTER — Ambulatory Visit: Payer: Medicare Other | Admitting: Nurse Practitioner

## 2017-01-31 DIAGNOSIS — I130 Hypertensive heart and chronic kidney disease with heart failure and stage 1 through stage 4 chronic kidney disease, or unspecified chronic kidney disease: Secondary | ICD-10-CM | POA: Diagnosis not present

## 2017-01-31 DIAGNOSIS — Z433 Encounter for attention to colostomy: Secondary | ICD-10-CM | POA: Diagnosis not present

## 2017-01-31 DIAGNOSIS — I5042 Chronic combined systolic (congestive) and diastolic (congestive) heart failure: Secondary | ICD-10-CM | POA: Diagnosis not present

## 2017-01-31 DIAGNOSIS — Z48813 Encounter for surgical aftercare following surgery on the respiratory system: Secondary | ICD-10-CM | POA: Diagnosis not present

## 2017-01-31 DIAGNOSIS — I2510 Atherosclerotic heart disease of native coronary artery without angina pectoris: Secondary | ICD-10-CM | POA: Diagnosis not present

## 2017-01-31 DIAGNOSIS — Z48815 Encounter for surgical aftercare following surgery on the digestive system: Secondary | ICD-10-CM | POA: Diagnosis not present

## 2017-02-03 DIAGNOSIS — Z48815 Encounter for surgical aftercare following surgery on the digestive system: Secondary | ICD-10-CM | POA: Diagnosis not present

## 2017-02-03 DIAGNOSIS — I2510 Atherosclerotic heart disease of native coronary artery without angina pectoris: Secondary | ICD-10-CM | POA: Diagnosis not present

## 2017-02-03 DIAGNOSIS — Z433 Encounter for attention to colostomy: Secondary | ICD-10-CM | POA: Diagnosis not present

## 2017-02-03 DIAGNOSIS — Z48813 Encounter for surgical aftercare following surgery on the respiratory system: Secondary | ICD-10-CM | POA: Diagnosis not present

## 2017-02-03 DIAGNOSIS — I130 Hypertensive heart and chronic kidney disease with heart failure and stage 1 through stage 4 chronic kidney disease, or unspecified chronic kidney disease: Secondary | ICD-10-CM | POA: Diagnosis not present

## 2017-02-03 DIAGNOSIS — I5042 Chronic combined systolic (congestive) and diastolic (congestive) heart failure: Secondary | ICD-10-CM | POA: Diagnosis not present

## 2017-02-06 ENCOUNTER — Other Ambulatory Visit: Payer: Self-pay | Admitting: *Deleted

## 2017-02-06 MED ORDER — DILTIAZEM HCL 60 MG PO TABS: 60.0000 mg | ORAL_TABLET | Freq: Three times a day (TID) | ORAL | 1 refills | Status: DC

## 2017-02-06 NOTE — Telephone Encounter (Signed)
Patient called and stated that Larry [name] increased his cardizem to 60 mg tid back in September but when he called his pharmacy he was informed that a new rx was not sent in reflecting this change. He is aware that I will send this in for him.

## 2017-02-07 DIAGNOSIS — Z48813 Encounter for surgical aftercare following surgery on the respiratory system: Secondary | ICD-10-CM | POA: Diagnosis not present

## 2017-02-07 DIAGNOSIS — I5042 Chronic combined systolic (congestive) and diastolic (congestive) heart failure: Secondary | ICD-10-CM | POA: Diagnosis not present

## 2017-02-07 DIAGNOSIS — Z48815 Encounter for surgical aftercare following surgery on the digestive system: Secondary | ICD-10-CM | POA: Diagnosis not present

## 2017-02-07 DIAGNOSIS — I130 Hypertensive heart and chronic kidney disease with heart failure and stage 1 through stage 4 chronic kidney disease, or unspecified chronic kidney disease: Secondary | ICD-10-CM | POA: Diagnosis not present

## 2017-02-07 DIAGNOSIS — I2510 Atherosclerotic heart disease of native coronary artery without angina pectoris: Secondary | ICD-10-CM | POA: Diagnosis not present

## 2017-02-07 DIAGNOSIS — Z433 Encounter for attention to colostomy: Secondary | ICD-10-CM | POA: Diagnosis not present

## 2017-02-10 DIAGNOSIS — I5042 Chronic combined systolic (congestive) and diastolic (congestive) heart failure: Secondary | ICD-10-CM | POA: Diagnosis not present

## 2017-02-10 DIAGNOSIS — Z48813 Encounter for surgical aftercare following surgery on the respiratory system: Secondary | ICD-10-CM | POA: Diagnosis not present

## 2017-02-10 DIAGNOSIS — Z48815 Encounter for surgical aftercare following surgery on the digestive system: Secondary | ICD-10-CM | POA: Diagnosis not present

## 2017-02-10 DIAGNOSIS — I2510 Atherosclerotic heart disease of native coronary artery without angina pectoris: Secondary | ICD-10-CM | POA: Diagnosis not present

## 2017-02-10 DIAGNOSIS — Z433 Encounter for attention to colostomy: Secondary | ICD-10-CM | POA: Diagnosis not present

## 2017-02-10 DIAGNOSIS — I130 Hypertensive heart and chronic kidney disease with heart failure and stage 1 through stage 4 chronic kidney disease, or unspecified chronic kidney disease: Secondary | ICD-10-CM | POA: Diagnosis not present

## 2017-02-13 DIAGNOSIS — Z48815 Encounter for surgical aftercare following surgery on the digestive system: Secondary | ICD-10-CM | POA: Diagnosis not present

## 2017-02-13 DIAGNOSIS — I5042 Chronic combined systolic (congestive) and diastolic (congestive) heart failure: Secondary | ICD-10-CM | POA: Diagnosis not present

## 2017-02-13 DIAGNOSIS — I130 Hypertensive heart and chronic kidney disease with heart failure and stage 1 through stage 4 chronic kidney disease, or unspecified chronic kidney disease: Secondary | ICD-10-CM | POA: Diagnosis not present

## 2017-02-13 DIAGNOSIS — Z48813 Encounter for surgical aftercare following surgery on the respiratory system: Secondary | ICD-10-CM | POA: Diagnosis not present

## 2017-02-13 DIAGNOSIS — Z433 Encounter for attention to colostomy: Secondary | ICD-10-CM | POA: Diagnosis not present

## 2017-02-13 DIAGNOSIS — I2510 Atherosclerotic heart disease of native coronary artery without angina pectoris: Secondary | ICD-10-CM | POA: Diagnosis not present

## 2017-02-17 DIAGNOSIS — I2510 Atherosclerotic heart disease of native coronary artery without angina pectoris: Secondary | ICD-10-CM | POA: Diagnosis not present

## 2017-02-17 DIAGNOSIS — Z48813 Encounter for surgical aftercare following surgery on the respiratory system: Secondary | ICD-10-CM | POA: Diagnosis not present

## 2017-02-17 DIAGNOSIS — I5042 Chronic combined systolic (congestive) and diastolic (congestive) heart failure: Secondary | ICD-10-CM | POA: Diagnosis not present

## 2017-02-17 DIAGNOSIS — Z48815 Encounter for surgical aftercare following surgery on the digestive system: Secondary | ICD-10-CM | POA: Diagnosis not present

## 2017-02-17 DIAGNOSIS — I130 Hypertensive heart and chronic kidney disease with heart failure and stage 1 through stage 4 chronic kidney disease, or unspecified chronic kidney disease: Secondary | ICD-10-CM | POA: Diagnosis not present

## 2017-02-17 DIAGNOSIS — Z433 Encounter for attention to colostomy: Secondary | ICD-10-CM | POA: Diagnosis not present

## 2017-02-20 DIAGNOSIS — I130 Hypertensive heart and chronic kidney disease with heart failure and stage 1 through stage 4 chronic kidney disease, or unspecified chronic kidney disease: Secondary | ICD-10-CM | POA: Diagnosis not present

## 2017-02-20 DIAGNOSIS — Z433 Encounter for attention to colostomy: Secondary | ICD-10-CM | POA: Diagnosis not present

## 2017-02-20 DIAGNOSIS — I2510 Atherosclerotic heart disease of native coronary artery without angina pectoris: Secondary | ICD-10-CM | POA: Diagnosis not present

## 2017-02-20 DIAGNOSIS — I5042 Chronic combined systolic (congestive) and diastolic (congestive) heart failure: Secondary | ICD-10-CM | POA: Diagnosis not present

## 2017-02-20 DIAGNOSIS — Z48815 Encounter for surgical aftercare following surgery on the digestive system: Secondary | ICD-10-CM | POA: Diagnosis not present

## 2017-02-20 DIAGNOSIS — Z48813 Encounter for surgical aftercare following surgery on the respiratory system: Secondary | ICD-10-CM | POA: Diagnosis not present

## 2017-02-22 ENCOUNTER — Encounter: Payer: Self-pay | Admitting: Nurse Practitioner

## 2017-02-22 ENCOUNTER — Ambulatory Visit (INDEPENDENT_AMBULATORY_CARE_PROVIDER_SITE_OTHER): Payer: Medicare Other | Admitting: Nurse Practitioner

## 2017-02-22 VITALS — BP 126/80 | HR 80 | Ht 71.0 in | Wt 336.8 lb

## 2017-02-22 DIAGNOSIS — I259 Chronic ischemic heart disease, unspecified: Secondary | ICD-10-CM | POA: Diagnosis not present

## 2017-02-22 DIAGNOSIS — I4719 Other supraventricular tachycardia: Secondary | ICD-10-CM

## 2017-02-22 DIAGNOSIS — I471 Supraventricular tachycardia: Secondary | ICD-10-CM | POA: Diagnosis not present

## 2017-02-22 DIAGNOSIS — I5022 Chronic systolic (congestive) heart failure: Secondary | ICD-10-CM

## 2017-02-22 LAB — CBC
Hematocrit: 38.7 % (ref 37.5–51.0)
Hemoglobin: 12.9 g/dL — ABNORMAL LOW (ref 13.0–17.7)
MCH: 28.5 pg (ref 26.6–33.0)
MCHC: 33.3 g/dL (ref 31.5–35.7)
MCV: 86 fL (ref 79–97)
Platelets: 155 10*3/uL (ref 150–379)
RBC: 4.52 x10E6/uL (ref 4.14–5.80)
RDW: 15.3 % (ref 12.3–15.4)
WBC: 5.7 10*3/uL (ref 3.4–10.8)

## 2017-02-22 LAB — BASIC METABOLIC PANEL
BUN/Creatinine Ratio: 11 (ref 10–24)
BUN: 22 mg/dL (ref 8–27)
CO2: 22 mmol/L (ref 20–29)
Calcium: 10.4 mg/dL — ABNORMAL HIGH (ref 8.6–10.2)
Chloride: 105 mmol/L (ref 96–106)
Creatinine, Ser: 2.01 mg/dL — ABNORMAL HIGH (ref 0.76–1.27)
GFR calc Af Amer: 39 mL/min/{1.73_m2} — ABNORMAL LOW (ref >59)
GFR calc non Af Amer: 34 mL/min/{1.73_m2} — ABNORMAL LOW (ref >59)
Glucose: 88 mg/dL (ref 65–99)
Potassium: 4.2 mmol/L (ref 3.5–5.2)
Sodium: 143 mmol/L (ref 134–144)

## 2017-02-22 LAB — PRO B NATRIURETIC PEPTIDE: NT-Pro BNP: 87 pg/mL (ref 0–210)

## 2017-02-22 MED ORDER — FUROSEMIDE 40 MG PO TABS: 80.0000 mg | ORAL_TABLET | Freq: Every day | ORAL | 3 refills | Status: DC

## 2017-02-22 NOTE — Progress Notes (Signed)
CARDIOLOGY OFFICE NOTE  Date:  02/22/2017    [name] Date of Birth: [birth year] Medical Record #[id number]  PCP:  Lin Landsman, MD  Cardiologist:  [place]    Chief Complaint  Patient presents with  . Coronary Artery Disease  . Cardiomyopathy  . Congestive Heart Failure  . Irregular Heart Beat    Follow up visit - seen for Dr. Rayann Heman    History of Present Illness: Larry [name] is a 67 y.o. male who presents today for a follow up visit. Seen for Dr. Rayann Heman.   He has an ischemic CM with severe diffuse 3VD and prior stents to the RCA in 4098, systolic and diastolic dysfunction, HTN, HLD and obesity. Has had a history of long standing atrial tach that has been treated with beta blocker and CCB therapy. Was hospitalized back in April of 2013 with a HF exacerbation - had stopped his medicines. Last EF of 40 - to 11% with diastolic dysfunction as well noted in January of 2015.   Seen by me back in October of 2014 - Was out of his medicines again. Had lost weight and was anemic - was sending to GI and ended up having colon cancer.   He was admitted (02/02/2013 - 03/01/2013) to [facility] with subacute history of progressive weakness, constipation and difficulty eating and drinking. He presented with acute renal failure (Creatinine 7.83) and severe anemia (Hbg of 7.3) as well as obstruction. Colonoscopy doing this admission with biopsy revealed adenocarcinoma and patient underwent exploratory lap with partial colectomy with colostomy on 12/23 by Dr. Hulen Skains. Recovery was complicated by wound dehissance, requiring a return to the OR 12/29 with abdominal wound closure with VAC placment. After the procedure, the patient developed atrial tachycardia, so a dilt drip was started (12/29-1/2). On 1/4, the patient had a cardiac arrest, requiring defibrillation, and was subsequently transferred to the ICU. QT was found to be >700, with mild hypokalemia and  hypomagnesemia implicated as the cause. Lidocaine drip was started. Electrolyte abnormality treated and 2D-echo revealed an EF of 40-45% with periapical akinesis and biatrial enlargement. He experienced recurrent torsades on 01/05 and shocked in NSR. By 1/6, QTc had decreased to 499, lidocaine drip was d/c'ed, and patient was transferred out of ICU to stepdown. Then admitted to physical medicine and rehabilitation on 01/09 due to severe deconditioning. He was continued on protein supplement to promote wound healing. His abdominal wound was treated with VAC through 03/05/13. His heart rate was controlled without recurrent arrythmia and was maintained on potassium and magnesium supplements. He was discharged to home on 03/07/2013. He was last evaluated by Dr. Hulen Skains on 02/03 with review of his pathology demonstrating invasive adenocarcinoma of the transverse colon with penetration into the pericolonic fat. He had 0/18 lymph nodes negative for metastatic disease. He was pathologic stage [id number].   Saw Dr. Gwenlyn Found in early July of 2015 for a foot ulcer - basically normal ABI's noted at that time.   He was readmitted to the hospital in August of 2015 - triggered by too much salt. Presented with respiratory distress - treated with Bipap and quickly stabilized. Was diuresed about 6 liters as well. Started on low dose ACE. Sleep study recommended.  I have seen back here several times since that hospitalization - he got referred for a sleep study and is on CPAP - in March of 2016 - wanting to get his colostomy reversed and needing pre op clearance.  Had  a Myoview to risk stratify - this was high risk and I referred him on for cardiac cath with Dr. Burt Knack. Underwent PCI to the LAD with DES x 2. Committed to DAPT for one year ideally. He had to wait on colostomy reversal due to being on DAPT.   Seen at the cancer center back in September of 2017 -HR elevated - asked to follow up here - I increased his Coreg further.  He ended up going in to the hospital for aweekend - still tachycardic - switched over to daily dosing of Verapamil and changed Coreg to Lopressor - looks like his ACE was stopped - pulmonary wanted him on ARB which was to be considered as an outpatient. Seen by pulmonary for a cough which led to a CT scan. HR in the 130's the whole time while in the ER. I then saw him back like 3 days later - he was feeling fine. Not short of breath. No more cough.   In November of 2017 - was having his tests done for planned reversal of his colostomy. Colostomy reversal occurred in February - did not go well - had an anastomotic lead on POD 7 -  ended up with an ileostomy and required a stay at [facility] for respiratory failure. He was seen by cardiology for intermittent tachycardia.   I last saw him back in July for - he had been home for about of month - out of several of his medicines. No awareness of his heart beating fast.   Comes in today. Here alone today. Says he is doing ok. No chest pain. Not short of breath. Not very active - not doing "nothing". Sits in the house most days. Not really going out of the house. He has chronic swelling - he thinks it is doing ok. His weight is up. He is pretty adamant that he is not short of breath. Tells me his wife wishes he would "walk more". He used to go to [employer] and walk in the store at least 2 times a week - has not done this since his second surgery earlier this year.   Past Medical History:  Diagnosis Date  . Arthritis   . Atrial tachycardia ([place])    managed on beta blocker therapy  . Childhood asthma    "went away after I was 16"  . Chronic combined systolic and diastolic CHF, NYHA class 3 (HCC)    has diastolic heart failure grade 1; EF is 45 to 50% per echo 05/2011; EF 41% by Myoview 2016  . CKD (chronic kidney disease) stage 3, GFR 30-59 ml/min (HCC)   . Colon cancer ([place]) 2015   MSI high; IHC loss of MLH1 and PMS2; BRAF negative; Negative methylation  .  COPD (chronic obstructive pulmonary disease) ([place])   . Coronary artery disease   . Family history of ovarian cancer   . Hypercholesteremia   . Hypertension   . Noncompliance   . NSVT (nonsustained ventricular tachycardia) (HCC)    beta blocker restarted  . Obesity   . OSA on CPAP    used nightly, pt does not know settings  . Peripheral arterial disease (HCC)    4.2 cm thoracic aortic aneurysm per chest ct 11-14-15 epic  . S/P colostomy ([place])    2014  . Thrombocytopenia ([place])   . Torsades de pointes ([place])    X 2 episodes during hospital visit 12'14"electrolyte imbalance"- "Shocked"    Past Surgical History:  Procedure Laterality Date  .  COLON SURGERY    . COLONOSCOPY N/A 02/08/2013   Procedure: COLONOSCOPY;  Surgeon: [name], MD;  Location: [place];  Service: Endoscopy;  Laterality: N/A;  . COLONOSCOPY WITH PROPOFOL N/A 05/09/2014   Procedure: COLONOSCOPY WITH PROPOFOL;  Surgeon: [name], MD;  Location: [facility];  Service: Endoscopy;  Laterality: N/A;  . COLONOSCOPY WITH PROPOFOL N/A 01/08/2016   Procedure: COLONOSCOPY WITH PROPOFOL;  Surgeon: [name], MD;  Location: [facility];  Service: Endoscopy;  Laterality: N/A;  . COLOSTOMY N/A 04/19/2016   Procedure: COLOSTOMY;  Surgeon: Judeth Horn, MD;  Location: [place];  Service: General;  Laterality: N/A;  . COLOSTOMY REVERSAL  04/12/2016  . COLOSTOMY REVERSAL N/A 04/12/2016   Procedure: COLOSTOMY REVERSAL;  Surgeon: Judeth Horn, MD;  Location: [place];  Service: General;  Laterality: N/A;  . CORONARY ANGIOPLASTY WITH STENT PLACEMENT  11/12/2008; 06/11/2014   stent x 2 to RCA; stent x 2 to LAD  . CORONARY ANGIOPLASTY WITH STENT PLACEMENT  06/11/2014   m-LAD 3.5 x 16 mm Synergy DES, d-LAD  2.25 x 16 mm Synergy DES  . ESOPHAGOGASTRODUODENOSCOPY N/A 02/08/2013   Procedure: ESOPHAGOGASTRODUODENOSCOPY (EGD);  Surgeon: [name], MD;  Location: [facility] ENDOSCOPY;  Service: Endoscopy;  Laterality: N/A;  . FLEXIBLE  SIGMOIDOSCOPY N/A 02/19/2016   Procedure: FLEXIBLE SIGMOIDOSCOPY;  Surgeon: [name], MD;  Location: [facility];  Service: Endoscopy;  Laterality: N/A;  . LAPAROTOMY N/A 02/12/2013   Procedure: EXPLORATORY LAPAROTOMY PARTIAL COLECTOMY WITH COLOSTOMY;  Surgeon: [name], MD;  Location: [place];  Service: General;  Laterality: N/A;  . LAPAROTOMY N/A 02/18/2013   Procedure: EXPLORATORY LAPAROTOMY/Closure of Wound;  Surgeon: [name], MD;  Location: [place];  Service: General;  Laterality: N/A;  . LAPAROTOMY N/A 04/19/2016   Procedure: EXPLORATORY LAPAROTOMY, REPAIR OF ANASTAMOTIC LEAK;  Surgeon: Judeth Horn, MD;  Location: [place];  Service: General;  Laterality: N/A;  . LEFT HEART CATHETERIZATION WITH CORONARY ANGIOGRAM N/A 06/11/2014   Procedure: LEFT HEART CATHETERIZATION WITH CORONARY ANGIOGRAM;  Surgeon: [name], MD; CFX calcified, 30-40 percent, RCA calcified, 40/50/40%, PDA diffuse disease, LAD 40/75/90% s/p DES 2      Medications: Current Meds  Medication Sig  . aspirin EC 81 MG tablet Take 1 tablet (81 mg total) by mouth daily.  [name] Kitchen atorvastatin (LIPITOR) 40 MG tablet Take 1 tablet (40 mg total) by mouth daily.  . clopidogrel (PLAVIX) 75 MG tablet TAKE 1 TABLET (75 MG TOTAL) BY MOUTH DAILY WITH BREAKFAST. (Patient taking differently: Take 75 mg by mouth daily. TAKE 1 TABLET (75 MG TOTAL) BY MOUTH DAILY WITH BREAKFAST.)  . diltiazem (CARDIZEM) 60 MG tablet Take 1 tablet (60 mg total) by mouth 3 (three) times daily.  . ferrous sulfate 325 (65 FE) MG tablet Take 1 tablet (325 mg total) by mouth daily with breakfast.  . furosemide (LASIX) 40 MG tablet Take 2 tablets (80 mg total) by mouth daily. To take 80 mg daily for one week - then decrease to 60 mg a day.  . levalbuterol (XOPENEX HFA) 45 MCG/ACT inhaler INHALE 2 PUFFS INTO THE LUNGS EVERY 4 (FOUR) HOURS AS NEEDED FOR WHEEZING.  . metoprolol tartrate (LOPRESSOR) 25 MG tablet Take 1.5 tablets (37.5 mg total) by mouth 2  (two) times daily.  . Multiple Vitamins-Minerals (MULTIVITAMIN WITH MINERALS) tablet Take 1 tablet by mouth daily.  . nitroGLYCERIN (NITROSTAT) 0.4 MG SL tablet Place 0.4 mg under the tongue every 5 (five) minutes as needed for chest pain.  [name] Kitchen  ondansetron (ZOFRAN) 8 MG tablet TAKE 1 TABLET EVERY 6 HOURS AS NEEDED FOR NAUSEA AND VOMITING  . simethicone (MYLICON) 779 MG chewable tablet Chew 125 mg by mouth 3 (three) times daily.   . [DISCONTINUED] furosemide (LASIX) 40 MG tablet Take 1 tablet (40 mg total) by mouth daily.     Allergies: Allergies  Allergen Reactions  . No Known Allergies     Social History: The patient  reports that he quit smoking about 8 years ago. His smoking use included cigarettes. He has a 38.00 pack-year smoking history. he has never used smokeless tobacco. He reports that he drinks alcohol. He reports that he does not use drugs.   Family History: The patient's family history includes Dementia in his mother; Diabetes in his father; Heart disease in his father; Hypertension in his father and mother; Leukemia in his maternal uncle; Liver cancer in his maternal grandmother; Parkinsonism in his mother; Prostate cancer in his maternal uncle.   Review of Systems: Please see the history of present illness.   Otherwise, the review of systems is positive for none.   All other systems are reviewed and negative.   Physical Exam: VS:  BP 126/80 (BP Location: Left Arm, Patient Position: Sitting, Cuff Size: Large)   Pulse 80   Ht _0  (1.803 m)   Wt (!) 336 lb 12.8 oz (152.8 kg)   BMI 46.97 kg/m  .  BMI Body mass index is 46.97 kg/m.  Wt Readings from Last 3 Encounters:  02/22/17 (!) 336 lb 12.8 oz (152.8 kg)  09/20/16 (!) 320 lb 6.4 oz (145.3 kg)  08/12/16 (!) 304 lb (137.9 kg)    General: Pleasant. Morbidly obese - he has gained weight. Alert and in no acute distress.   HEENT: Normal.  Neck: Supple, no JVD, carotid bruits, or masses noted.  Cardiac: Regular rate  and rhythm. Heart tones are distant.  Legs are just massive with edema. Open abrasions on his legs.  Respiratory:  Lungs are clear with decreased breath sounds but with normal work of breathing.  GI: Soft and nontender. Obese.  MS: No deformity or atrophy. Gait and ROM intact.  Skin: Warm and dry. Color is normal.  Neuro:  Strength and sensation are intact and no gross focal deficits noted.  Psych: Alert, appropriate and with normal affect.   LABORATORY DATA:  EKG:  EKG is ordered today. This demonstrates NSR - his HR is 80 today.  Lab Results  Component Value Date   WBC 6.4 10/06/2016   HGB 10.4 (L) 10/06/2016   HCT 33.5 (L) 10/06/2016   PLT 157 10/06/2016   GLUCOSE 102 (H) 10/06/2016   CHOL 168 06/29/2015   TRIG 246 (H) 05/02/2016   HDL 47 06/29/2015   LDLCALC 93 06/29/2015   ALT 18 10/06/2016   AST 17 10/06/2016   NA 139 10/06/2016   K 3.2 (L) 10/06/2016   CL 108 10/06/2016   CREATININE 1.86 (H) 10/06/2016   BUN 22 (H) 10/06/2016   CO2 23 10/06/2016   TSH 1.080 05/14/2016   INR 1.12 05/14/2016   HGBA1C 5.9 (H) 05/14/2016     BNP (last 3 results) Recent Labs    04/20/16 1055  BNP 43.3    ProBNP (last 3 results) No results for input(s): PROBNP in the last 8760 hours.   Other Studies Reviewed Today:  Echo Study Conclusions 03/2016  - Left ventricle: The cavity size was normal. Wall thickness was increased in a pattern of moderate LVH.  Systolic function was normal. The estimated ejection fraction was in the range of 55% to 60%. Wall motion was normal; there were no regional wall motion abnormalities. The study is not technically sufficient to allow evaluation of LV diastolic function. - Aortic root: The aortic root was mildly dilated. - Mitral valve: Calcified annulus.  Impressions:  - Technically difficult; definity used; normal LV function; moderate LVH.   MyoviewStudy Highlights 01/2016   Nuclear stress EF: 58%.  There was no  ST segment deviation noted during stress.  There is a large defect of severe severity present in the basal inferior, mid inferior, apical septal, apical inferior and apex location. The defect is non-reversible. No ischemia noted. Tthis is consistent with diaphragmatic attenuation artifact vs. Scar.  This is a low risk study.  The left ventricular ejection fraction is normal (55-65%).      Procedure: Left Heart Cath, [facility]ive Coronary Angiography, PTCA and stenting of the mid and distal LAD April 2016.  Coronary angiography: Left mainstem: The left mainstem is patent. The vessel divides into the LAD and left circumflex. There is no significant stenosis, but there is mild irregularity.  Left anterior descending (LAD): The LAD is moderately calcified. The vessel is severely diseased. The proximal LAD is patent with 30-40% stenosis after the second septal perforator. There is diffuse calcification. The diagonal branches are small. The mid LAD has an eccentric 75% stenosis at the origin of the second diagonal branch. Beyond that area the mid and distal LAD are diffusely diseased. There is another severe stenosis in the apical LAD of 90%.  Left circumflex (LCx): The left circumflex is calcified. The vessel is patent with mild irregularity. There are no high-grade stenoses identified. There are scattered 30-40% stenoses throughout the proximal and mid circumflex as well as the first OM branch.  Right coronary artery (RCA): The RCA is dominant. The vessel is heavily calcified. The stented segments in the mid and distal RCA are patent. The proximal vessel has 30-40% stenosis. The mid vessel has 30-40% stenosis. The distal vessel is patent with 50-60% stenosis involving the origin of the PDA. The PDA is diffusely diseased.  Left ventriculography: Deferred because of chronic kidney disease. By nuclear scan the LVEF is 41%.  PCI Note: Following the diagnostic procedure, the  decision was made to proceed with PCI of the mid and apical LAD. The right coronary artery had patent stents in the left circumflex had nonobstructive disease. The LAD is a diffusely diseased vessel and the mid and distal vessel would be a poor surgical targets. I felt that PCI was the best option in this patient with a high risk nuclear scan demonstrating anteroapical and anterolateral ischemia. He will require colostomy reversal, so I planned on treating him with Synergy drug-eluting stents which had a biodegradable polymer and potentially can allow for earlier interruption of dual antiplatelet therapy. The patient was loaded with Plavix 600 mg. Weight-based bivalirudin was given for anticoagulation. Once a therapeutic ACT was achieved, a 6 [place] XB LAD 3.5 cm guide catheter was inserted. A cougar coronary guidewire was used to cross the lesion in the apical LAD. The lesion was predilated with a 2.5 x 12 mm balloon. The same balloon was used to dilate the mid lesion. The apical lesion was then stented with a 2.25 x 16 mm Synergy DES. The stent was postdilated with a 2.5 mm noncompliant balloon. The mid lesion was then stented with a 3.5 x 16 mm Synergy DES. That stent was postdilated with  a 3.75 mm noncompliant balloon. Following PCI, there was 0% residual stenosis and TIMI-3 flow. Final angiography confirmed an excellent result. The patient tolerated the procedure well. There were no immediate procedural complications. A TR band was used for radial hemostasis. The patient was transferred to the post catheterization recovery area for further monitoring.  PCI Data: Lesion 1: Vessel - LAD/Segment - distal/apical Percent Stenosis (pre) 90 TIMI-flow 3 Stent 2.25 x 16 mm Synergy DES Percent Stenosis (post) 0 TIMI-flow (post) 3  Lesion 2: Vessel - LAD/Segment - mid Percent Stenosis (pre) 75 TIMI-flow 3 Stent 3.5 x 16 mm Synergy DES Percent Stenosis (post) 0 TIMI-flow (post) 3  Estimated  Blood Loss: Minimal  Final Conclusions:  1. Two-vessel coronary artery disease with continued patency of the stented segments in the right coronary artery and severe stenoses of the mid and apical LAD 2. Mild diffuse nonobstructive left circumflex stenosis 3. Successful PCI of the LAD using 2 drug-eluting stents   Recommendations:  Dual antiplatelet therapy for at least 6 months. Would be reasonable to consider interruption of aspirin and Plavix at 6 months for colostomy reversal.  [name] MD, [facility] 06/11/2014, 10:06 AM   Assessment / Plan:  1. CAD with prior PCI to the RCA - S/P cath with PCI to the LAD back in April of 2016 following high risk Myoview - has had DES x 2 to the LAD - he remains on aspirin/Plavix. Follow up Myoview from 01/2016 was low risk. Needs CV risk factor modification but this is challenging at best.   2. History of combined systolic and diastolic HF - most recent echo with normal EF now noted. Back on his Lasix. EF is ok. Weight is way up. Lots of swelling on my exam - checking lab today. Increasing Lasix. Most likely will need to get back on potassium. Further disposition to follow.  3. Colon cancer - followed by oncology - has had attempt at colostomy reversal now with ileostomy in place.  Activity encouraged.   4. HTN - stable on present regimen. No changes made today.  5. CKD - off ACE.   6. OSA - now on CPAP and followed by Dr. Annamaria Boots - using sporadically.   7. HLD - on statin therapy - lab on return  8. Atrial tachycardia - chronic issue - he has been totally asymptomatic.  He is back on low dose beta blocker and CCB therapy - he is in sinus today - HR is controlled. No changes made today.    Current medicines are reviewed with the patient today.  The patient does not have concerns regarding medicines other than what has been noted above.  The following changes have been made:  See above.  Labs/ tests ordered today include:     Orders Placed This Encounter  Procedures  . Basic metabolic panel  . CBC  . Pro b natriuretic peptide (BNP)  . EKG 12-Lead     Disposition:   FU with me in about 3 months.    Patient is agreeable to this plan and will call if any problems develop in the interim.   SignedTruitt Merle, NP  02/22/2017 9:22 AM  [facility] [address] [place], [place]  [address] Phone: [phone] Fax: [phone]

## 2017-02-22 NOTE — Patient Instructions (Addendum)
We will be checking the following labs today - BMET, CBC and BNP   Medication Instructions:    Continue with your current medicines. BUT  I am going to increase the Lasix to 2 pills a day for a week - then to 60 mg each day (this will be a pill and a half each day) - I have sent in a refill to your pharmacy today  We will let you know about resuming the potassium    Testing/Procedures To Be Arranged:  N/A  Follow-Up:   See me in 3 months    Other Special Instructions:   Try to get more activity    If you need a refill on your cardiac medications before your next appointment, please call your pharmacy.   Call the [place] office at [phone] if you have any questions, problems or concerns.

## 2017-02-23 ENCOUNTER — Telehealth: Payer: Self-pay | Admitting: *Deleted

## 2017-02-23 DIAGNOSIS — I5041 Acute combined systolic (congestive) and diastolic (congestive) heart failure: Secondary | ICD-10-CM

## 2017-02-23 NOTE — Telephone Encounter (Signed)
Pt has been notified of lab results by phone with verbal understanding. Pt agreeable to repeat BMET in 1 week, scheduled for 1/10. Pt thanked me for my call.

## 2017-02-23 NOTE — Telephone Encounter (Signed)
-----   Message from Burtis Junes, NP sent at 02/22/2017 10:01 PM EST ----- Ok to report. His labs are stable - does have chronic kidney disease - fluid level not elevated. Potassium level is just fine. Would continue on current regimen but plan to recheck BMET in one week since we increased his Lasix at his visit.

## 2017-02-24 DIAGNOSIS — I2510 Atherosclerotic heart disease of native coronary artery without angina pectoris: Secondary | ICD-10-CM | POA: Diagnosis not present

## 2017-02-24 DIAGNOSIS — Z48815 Encounter for surgical aftercare following surgery on the digestive system: Secondary | ICD-10-CM | POA: Diagnosis not present

## 2017-02-24 DIAGNOSIS — I130 Hypertensive heart and chronic kidney disease with heart failure and stage 1 through stage 4 chronic kidney disease, or unspecified chronic kidney disease: Secondary | ICD-10-CM | POA: Diagnosis not present

## 2017-02-24 DIAGNOSIS — I5042 Chronic combined systolic (congestive) and diastolic (congestive) heart failure: Secondary | ICD-10-CM | POA: Diagnosis not present

## 2017-02-24 DIAGNOSIS — Z48813 Encounter for surgical aftercare following surgery on the respiratory system: Secondary | ICD-10-CM | POA: Diagnosis not present

## 2017-02-24 DIAGNOSIS — Z433 Encounter for attention to colostomy: Secondary | ICD-10-CM | POA: Diagnosis not present

## 2017-03-01 DIAGNOSIS — I130 Hypertensive heart and chronic kidney disease with heart failure and stage 1 through stage 4 chronic kidney disease, or unspecified chronic kidney disease: Secondary | ICD-10-CM | POA: Diagnosis not present

## 2017-03-01 DIAGNOSIS — I5042 Chronic combined systolic (congestive) and diastolic (congestive) heart failure: Secondary | ICD-10-CM | POA: Diagnosis not present

## 2017-03-01 DIAGNOSIS — Z48813 Encounter for surgical aftercare following surgery on the respiratory system: Secondary | ICD-10-CM | POA: Diagnosis not present

## 2017-03-01 DIAGNOSIS — Z48815 Encounter for surgical aftercare following surgery on the digestive system: Secondary | ICD-10-CM | POA: Diagnosis not present

## 2017-03-01 DIAGNOSIS — I2510 Atherosclerotic heart disease of native coronary artery without angina pectoris: Secondary | ICD-10-CM | POA: Diagnosis not present

## 2017-03-01 DIAGNOSIS — Z433 Encounter for attention to colostomy: Secondary | ICD-10-CM | POA: Diagnosis not present

## 2017-03-02 ENCOUNTER — Other Ambulatory Visit: Payer: Medicare Other | Admitting: *Deleted

## 2017-03-02 DIAGNOSIS — I5041 Acute combined systolic (congestive) and diastolic (congestive) heart failure: Secondary | ICD-10-CM

## 2017-03-02 LAB — BASIC METABOLIC PANEL
BUN/Creatinine Ratio: 11 (ref 10–24)
BUN: 24 mg/dL (ref 8–27)
CO2: 21 mmol/L (ref 20–29)
Calcium: 10 mg/dL (ref 8.6–10.2)
Chloride: 102 mmol/L (ref 96–106)
Creatinine, Ser: 2.15 mg/dL — ABNORMAL HIGH (ref 0.76–1.27)
GFR calc Af Amer: 36 mL/min/{1.73_m2} — ABNORMAL LOW (ref >59)
GFR calc non Af Amer: 31 mL/min/{1.73_m2} — ABNORMAL LOW (ref >59)
Glucose: 92 mg/dL (ref 65–99)
Potassium: 3.9 mmol/L (ref 3.5–5.2)
Sodium: 141 mmol/L (ref 134–144)

## 2017-03-03 DIAGNOSIS — Z433 Encounter for attention to colostomy: Secondary | ICD-10-CM | POA: Diagnosis not present

## 2017-03-03 DIAGNOSIS — I130 Hypertensive heart and chronic kidney disease with heart failure and stage 1 through stage 4 chronic kidney disease, or unspecified chronic kidney disease: Secondary | ICD-10-CM | POA: Diagnosis not present

## 2017-03-03 DIAGNOSIS — I2510 Atherosclerotic heart disease of native coronary artery without angina pectoris: Secondary | ICD-10-CM | POA: Diagnosis not present

## 2017-03-03 DIAGNOSIS — Z48815 Encounter for surgical aftercare following surgery on the digestive system: Secondary | ICD-10-CM | POA: Diagnosis not present

## 2017-03-03 DIAGNOSIS — Z48813 Encounter for surgical aftercare following surgery on the respiratory system: Secondary | ICD-10-CM | POA: Diagnosis not present

## 2017-03-03 DIAGNOSIS — I5042 Chronic combined systolic (congestive) and diastolic (congestive) heart failure: Secondary | ICD-10-CM | POA: Diagnosis not present

## 2017-03-06 DIAGNOSIS — I130 Hypertensive heart and chronic kidney disease with heart failure and stage 1 through stage 4 chronic kidney disease, or unspecified chronic kidney disease: Secondary | ICD-10-CM | POA: Diagnosis not present

## 2017-03-06 DIAGNOSIS — I5042 Chronic combined systolic (congestive) and diastolic (congestive) heart failure: Secondary | ICD-10-CM | POA: Diagnosis not present

## 2017-03-06 DIAGNOSIS — Z48815 Encounter for surgical aftercare following surgery on the digestive system: Secondary | ICD-10-CM | POA: Diagnosis not present

## 2017-03-06 DIAGNOSIS — I2510 Atherosclerotic heart disease of native coronary artery without angina pectoris: Secondary | ICD-10-CM | POA: Diagnosis not present

## 2017-03-06 DIAGNOSIS — Z48813 Encounter for surgical aftercare following surgery on the respiratory system: Secondary | ICD-10-CM | POA: Diagnosis not present

## 2017-03-06 DIAGNOSIS — Z433 Encounter for attention to colostomy: Secondary | ICD-10-CM | POA: Diagnosis not present

## 2017-03-10 DIAGNOSIS — I5042 Chronic combined systolic (congestive) and diastolic (congestive) heart failure: Secondary | ICD-10-CM | POA: Diagnosis not present

## 2017-03-10 DIAGNOSIS — I130 Hypertensive heart and chronic kidney disease with heart failure and stage 1 through stage 4 chronic kidney disease, or unspecified chronic kidney disease: Secondary | ICD-10-CM | POA: Diagnosis not present

## 2017-03-10 DIAGNOSIS — Z433 Encounter for attention to colostomy: Secondary | ICD-10-CM | POA: Diagnosis not present

## 2017-03-10 DIAGNOSIS — Z48813 Encounter for surgical aftercare following surgery on the respiratory system: Secondary | ICD-10-CM | POA: Diagnosis not present

## 2017-03-10 DIAGNOSIS — Z48815 Encounter for surgical aftercare following surgery on the digestive system: Secondary | ICD-10-CM | POA: Diagnosis not present

## 2017-03-10 DIAGNOSIS — I2510 Atherosclerotic heart disease of native coronary artery without angina pectoris: Secondary | ICD-10-CM | POA: Diagnosis not present

## 2017-03-13 DIAGNOSIS — I130 Hypertensive heart and chronic kidney disease with heart failure and stage 1 through stage 4 chronic kidney disease, or unspecified chronic kidney disease: Secondary | ICD-10-CM | POA: Diagnosis not present

## 2017-03-13 DIAGNOSIS — Z48815 Encounter for surgical aftercare following surgery on the digestive system: Secondary | ICD-10-CM | POA: Diagnosis not present

## 2017-03-13 DIAGNOSIS — Z433 Encounter for attention to colostomy: Secondary | ICD-10-CM | POA: Diagnosis not present

## 2017-03-13 DIAGNOSIS — I5042 Chronic combined systolic (congestive) and diastolic (congestive) heart failure: Secondary | ICD-10-CM | POA: Diagnosis not present

## 2017-03-13 DIAGNOSIS — I2510 Atherosclerotic heart disease of native coronary artery without angina pectoris: Secondary | ICD-10-CM | POA: Diagnosis not present

## 2017-03-13 DIAGNOSIS — Z48813 Encounter for surgical aftercare following surgery on the respiratory system: Secondary | ICD-10-CM | POA: Diagnosis not present

## 2017-03-17 DIAGNOSIS — Z433 Encounter for attention to colostomy: Secondary | ICD-10-CM | POA: Diagnosis not present

## 2017-03-17 DIAGNOSIS — Z48813 Encounter for surgical aftercare following surgery on the respiratory system: Secondary | ICD-10-CM | POA: Diagnosis not present

## 2017-03-17 DIAGNOSIS — Z48815 Encounter for surgical aftercare following surgery on the digestive system: Secondary | ICD-10-CM | POA: Diagnosis not present

## 2017-03-17 DIAGNOSIS — I130 Hypertensive heart and chronic kidney disease with heart failure and stage 1 through stage 4 chronic kidney disease, or unspecified chronic kidney disease: Secondary | ICD-10-CM | POA: Diagnosis not present

## 2017-03-17 DIAGNOSIS — I2510 Atherosclerotic heart disease of native coronary artery without angina pectoris: Secondary | ICD-10-CM | POA: Diagnosis not present

## 2017-03-17 DIAGNOSIS — I5042 Chronic combined systolic (congestive) and diastolic (congestive) heart failure: Secondary | ICD-10-CM | POA: Diagnosis not present

## 2017-03-20 DIAGNOSIS — N183 Chronic kidney disease, stage 3 (moderate): Secondary | ICD-10-CM | POA: Diagnosis not present

## 2017-03-20 DIAGNOSIS — I5042 Chronic combined systolic (congestive) and diastolic (congestive) heart failure: Secondary | ICD-10-CM | POA: Diagnosis not present

## 2017-03-20 DIAGNOSIS — Z433 Encounter for attention to colostomy: Secondary | ICD-10-CM | POA: Diagnosis not present

## 2017-03-20 DIAGNOSIS — Z7902 Long term (current) use of antithrombotics/antiplatelets: Secondary | ICD-10-CM | POA: Diagnosis not present

## 2017-03-20 DIAGNOSIS — Z85038 Personal history of other malignant neoplasm of large intestine: Secondary | ICD-10-CM | POA: Diagnosis not present

## 2017-03-20 DIAGNOSIS — I739 Peripheral vascular disease, unspecified: Secondary | ICD-10-CM | POA: Diagnosis not present

## 2017-03-20 DIAGNOSIS — I4891 Unspecified atrial fibrillation: Secondary | ICD-10-CM | POA: Diagnosis not present

## 2017-03-20 DIAGNOSIS — I130 Hypertensive heart and chronic kidney disease with heart failure and stage 1 through stage 4 chronic kidney disease, or unspecified chronic kidney disease: Secondary | ICD-10-CM | POA: Diagnosis not present

## 2017-03-20 DIAGNOSIS — J449 Chronic obstructive pulmonary disease, unspecified: Secondary | ICD-10-CM | POA: Diagnosis not present

## 2017-03-20 DIAGNOSIS — Z48815 Encounter for surgical aftercare following surgery on the digestive system: Secondary | ICD-10-CM | POA: Diagnosis not present

## 2017-03-20 DIAGNOSIS — Z48813 Encounter for surgical aftercare following surgery on the respiratory system: Secondary | ICD-10-CM | POA: Diagnosis not present

## 2017-03-20 DIAGNOSIS — Z7982 Long term (current) use of aspirin: Secondary | ICD-10-CM | POA: Diagnosis not present

## 2017-03-20 DIAGNOSIS — E669 Obesity, unspecified: Secondary | ICD-10-CM | POA: Diagnosis not present

## 2017-03-20 DIAGNOSIS — I2510 Atherosclerotic heart disease of native coronary artery without angina pectoris: Secondary | ICD-10-CM | POA: Diagnosis not present

## 2017-03-20 DIAGNOSIS — Z6841 Body Mass Index (BMI) 40.0 and over, adult: Secondary | ICD-10-CM | POA: Diagnosis not present

## 2017-03-24 DIAGNOSIS — I5042 Chronic combined systolic (congestive) and diastolic (congestive) heart failure: Secondary | ICD-10-CM | POA: Diagnosis not present

## 2017-03-24 DIAGNOSIS — I130 Hypertensive heart and chronic kidney disease with heart failure and stage 1 through stage 4 chronic kidney disease, or unspecified chronic kidney disease: Secondary | ICD-10-CM | POA: Diagnosis not present

## 2017-03-24 DIAGNOSIS — N183 Chronic kidney disease, stage 3 (moderate): Secondary | ICD-10-CM | POA: Diagnosis not present

## 2017-03-24 DIAGNOSIS — Z433 Encounter for attention to colostomy: Secondary | ICD-10-CM | POA: Diagnosis not present

## 2017-03-24 DIAGNOSIS — I2510 Atherosclerotic heart disease of native coronary artery without angina pectoris: Secondary | ICD-10-CM | POA: Diagnosis not present

## 2017-03-24 DIAGNOSIS — Z48815 Encounter for surgical aftercare following surgery on the digestive system: Secondary | ICD-10-CM | POA: Diagnosis not present

## 2017-03-27 ENCOUNTER — Telehealth: Payer: Self-pay | Admitting: *Deleted

## 2017-03-27 DIAGNOSIS — I2510 Atherosclerotic heart disease of native coronary artery without angina pectoris: Secondary | ICD-10-CM | POA: Diagnosis not present

## 2017-03-27 DIAGNOSIS — Z433 Encounter for attention to colostomy: Secondary | ICD-10-CM | POA: Diagnosis not present

## 2017-03-27 DIAGNOSIS — I5042 Chronic combined systolic (congestive) and diastolic (congestive) heart failure: Secondary | ICD-10-CM | POA: Diagnosis not present

## 2017-03-27 DIAGNOSIS — I130 Hypertensive heart and chronic kidney disease with heart failure and stage 1 through stage 4 chronic kidney disease, or unspecified chronic kidney disease: Secondary | ICD-10-CM | POA: Diagnosis not present

## 2017-03-27 DIAGNOSIS — N183 Chronic kidney disease, stage 3 (moderate): Secondary | ICD-10-CM | POA: Diagnosis not present

## 2017-03-27 DIAGNOSIS — Z48815 Encounter for surgical aftercare following surgery on the digestive system: Secondary | ICD-10-CM | POA: Diagnosis not present

## 2017-03-27 NOTE — Telephone Encounter (Signed)
Faxing to [facility] @ [phone] phone # is [phone] orders.

## 2017-03-31 DIAGNOSIS — Z433 Encounter for attention to colostomy: Secondary | ICD-10-CM | POA: Diagnosis not present

## 2017-03-31 DIAGNOSIS — I130 Hypertensive heart and chronic kidney disease with heart failure and stage 1 through stage 4 chronic kidney disease, or unspecified chronic kidney disease: Secondary | ICD-10-CM | POA: Diagnosis not present

## 2017-03-31 DIAGNOSIS — I2510 Atherosclerotic heart disease of native coronary artery without angina pectoris: Secondary | ICD-10-CM | POA: Diagnosis not present

## 2017-03-31 DIAGNOSIS — N183 Chronic kidney disease, stage 3 (moderate): Secondary | ICD-10-CM | POA: Diagnosis not present

## 2017-03-31 DIAGNOSIS — I5042 Chronic combined systolic (congestive) and diastolic (congestive) heart failure: Secondary | ICD-10-CM | POA: Diagnosis not present

## 2017-03-31 DIAGNOSIS — Z48815 Encounter for surgical aftercare following surgery on the digestive system: Secondary | ICD-10-CM | POA: Diagnosis not present

## 2017-04-03 DIAGNOSIS — I130 Hypertensive heart and chronic kidney disease with heart failure and stage 1 through stage 4 chronic kidney disease, or unspecified chronic kidney disease: Secondary | ICD-10-CM | POA: Diagnosis not present

## 2017-04-03 DIAGNOSIS — I5042 Chronic combined systolic (congestive) and diastolic (congestive) heart failure: Secondary | ICD-10-CM | POA: Diagnosis not present

## 2017-04-03 DIAGNOSIS — Z48815 Encounter for surgical aftercare following surgery on the digestive system: Secondary | ICD-10-CM | POA: Diagnosis not present

## 2017-04-03 DIAGNOSIS — I2510 Atherosclerotic heart disease of native coronary artery without angina pectoris: Secondary | ICD-10-CM | POA: Diagnosis not present

## 2017-04-03 DIAGNOSIS — N183 Chronic kidney disease, stage 3 (moderate): Secondary | ICD-10-CM | POA: Diagnosis not present

## 2017-04-03 DIAGNOSIS — Z433 Encounter for attention to colostomy: Secondary | ICD-10-CM | POA: Diagnosis not present

## 2017-04-07 DIAGNOSIS — N183 Chronic kidney disease, stage 3 (moderate): Secondary | ICD-10-CM | POA: Diagnosis not present

## 2017-04-07 DIAGNOSIS — Z433 Encounter for attention to colostomy: Secondary | ICD-10-CM | POA: Diagnosis not present

## 2017-04-07 DIAGNOSIS — I5042 Chronic combined systolic (congestive) and diastolic (congestive) heart failure: Secondary | ICD-10-CM | POA: Diagnosis not present

## 2017-04-07 DIAGNOSIS — Z48815 Encounter for surgical aftercare following surgery on the digestive system: Secondary | ICD-10-CM | POA: Diagnosis not present

## 2017-04-07 DIAGNOSIS — I130 Hypertensive heart and chronic kidney disease with heart failure and stage 1 through stage 4 chronic kidney disease, or unspecified chronic kidney disease: Secondary | ICD-10-CM | POA: Diagnosis not present

## 2017-04-07 DIAGNOSIS — I2510 Atherosclerotic heart disease of native coronary artery without angina pectoris: Secondary | ICD-10-CM | POA: Diagnosis not present

## 2017-04-10 DIAGNOSIS — Z433 Encounter for attention to colostomy: Secondary | ICD-10-CM | POA: Diagnosis not present

## 2017-04-10 DIAGNOSIS — I5042 Chronic combined systolic (congestive) and diastolic (congestive) heart failure: Secondary | ICD-10-CM | POA: Diagnosis not present

## 2017-04-10 DIAGNOSIS — I2510 Atherosclerotic heart disease of native coronary artery without angina pectoris: Secondary | ICD-10-CM | POA: Diagnosis not present

## 2017-04-10 DIAGNOSIS — Z48815 Encounter for surgical aftercare following surgery on the digestive system: Secondary | ICD-10-CM | POA: Diagnosis not present

## 2017-04-10 DIAGNOSIS — N183 Chronic kidney disease, stage 3 (moderate): Secondary | ICD-10-CM | POA: Diagnosis not present

## 2017-04-10 DIAGNOSIS — I130 Hypertensive heart and chronic kidney disease with heart failure and stage 1 through stage 4 chronic kidney disease, or unspecified chronic kidney disease: Secondary | ICD-10-CM | POA: Diagnosis not present

## 2017-04-14 DIAGNOSIS — I2510 Atherosclerotic heart disease of native coronary artery without angina pectoris: Secondary | ICD-10-CM | POA: Diagnosis not present

## 2017-04-14 DIAGNOSIS — I5042 Chronic combined systolic (congestive) and diastolic (congestive) heart failure: Secondary | ICD-10-CM | POA: Diagnosis not present

## 2017-04-14 DIAGNOSIS — Z433 Encounter for attention to colostomy: Secondary | ICD-10-CM | POA: Diagnosis not present

## 2017-04-14 DIAGNOSIS — N183 Chronic kidney disease, stage 3 (moderate): Secondary | ICD-10-CM | POA: Diagnosis not present

## 2017-04-14 DIAGNOSIS — I130 Hypertensive heart and chronic kidney disease with heart failure and stage 1 through stage 4 chronic kidney disease, or unspecified chronic kidney disease: Secondary | ICD-10-CM | POA: Diagnosis not present

## 2017-04-14 DIAGNOSIS — Z48815 Encounter for surgical aftercare following surgery on the digestive system: Secondary | ICD-10-CM | POA: Diagnosis not present

## 2017-04-17 DIAGNOSIS — I130 Hypertensive heart and chronic kidney disease with heart failure and stage 1 through stage 4 chronic kidney disease, or unspecified chronic kidney disease: Secondary | ICD-10-CM | POA: Diagnosis not present

## 2017-04-17 DIAGNOSIS — I2510 Atherosclerotic heart disease of native coronary artery without angina pectoris: Secondary | ICD-10-CM | POA: Diagnosis not present

## 2017-04-17 DIAGNOSIS — I5042 Chronic combined systolic (congestive) and diastolic (congestive) heart failure: Secondary | ICD-10-CM | POA: Diagnosis not present

## 2017-04-17 DIAGNOSIS — N183 Chronic kidney disease, stage 3 (moderate): Secondary | ICD-10-CM | POA: Diagnosis not present

## 2017-04-17 DIAGNOSIS — Z48815 Encounter for surgical aftercare following surgery on the digestive system: Secondary | ICD-10-CM | POA: Diagnosis not present

## 2017-04-17 DIAGNOSIS — Z433 Encounter for attention to colostomy: Secondary | ICD-10-CM | POA: Diagnosis not present

## 2017-04-21 DIAGNOSIS — I130 Hypertensive heart and chronic kidney disease with heart failure and stage 1 through stage 4 chronic kidney disease, or unspecified chronic kidney disease: Secondary | ICD-10-CM | POA: Diagnosis not present

## 2017-04-21 DIAGNOSIS — N183 Chronic kidney disease, stage 3 (moderate): Secondary | ICD-10-CM | POA: Diagnosis not present

## 2017-04-21 DIAGNOSIS — Z433 Encounter for attention to colostomy: Secondary | ICD-10-CM | POA: Diagnosis not present

## 2017-04-21 DIAGNOSIS — I2510 Atherosclerotic heart disease of native coronary artery without angina pectoris: Secondary | ICD-10-CM | POA: Diagnosis not present

## 2017-04-21 DIAGNOSIS — I5042 Chronic combined systolic (congestive) and diastolic (congestive) heart failure: Secondary | ICD-10-CM | POA: Diagnosis not present

## 2017-04-21 DIAGNOSIS — Z48815 Encounter for surgical aftercare following surgery on the digestive system: Secondary | ICD-10-CM | POA: Diagnosis not present

## 2017-04-24 DIAGNOSIS — N183 Chronic kidney disease, stage 3 (moderate): Secondary | ICD-10-CM | POA: Diagnosis not present

## 2017-04-24 DIAGNOSIS — Z433 Encounter for attention to colostomy: Secondary | ICD-10-CM | POA: Diagnosis not present

## 2017-04-24 DIAGNOSIS — I5042 Chronic combined systolic (congestive) and diastolic (congestive) heart failure: Secondary | ICD-10-CM | POA: Diagnosis not present

## 2017-04-24 DIAGNOSIS — I130 Hypertensive heart and chronic kidney disease with heart failure and stage 1 through stage 4 chronic kidney disease, or unspecified chronic kidney disease: Secondary | ICD-10-CM | POA: Diagnosis not present

## 2017-04-24 DIAGNOSIS — Z48815 Encounter for surgical aftercare following surgery on the digestive system: Secondary | ICD-10-CM | POA: Diagnosis not present

## 2017-04-24 DIAGNOSIS — I2510 Atherosclerotic heart disease of native coronary artery without angina pectoris: Secondary | ICD-10-CM | POA: Diagnosis not present

## 2017-04-28 DIAGNOSIS — I2510 Atherosclerotic heart disease of native coronary artery without angina pectoris: Secondary | ICD-10-CM | POA: Diagnosis not present

## 2017-04-28 DIAGNOSIS — I5042 Chronic combined systolic (congestive) and diastolic (congestive) heart failure: Secondary | ICD-10-CM | POA: Diagnosis not present

## 2017-04-28 DIAGNOSIS — Z433 Encounter for attention to colostomy: Secondary | ICD-10-CM | POA: Diagnosis not present

## 2017-04-28 DIAGNOSIS — Z48815 Encounter for surgical aftercare following surgery on the digestive system: Secondary | ICD-10-CM | POA: Diagnosis not present

## 2017-04-28 DIAGNOSIS — N183 Chronic kidney disease, stage 3 (moderate): Secondary | ICD-10-CM | POA: Diagnosis not present

## 2017-04-28 DIAGNOSIS — I130 Hypertensive heart and chronic kidney disease with heart failure and stage 1 through stage 4 chronic kidney disease, or unspecified chronic kidney disease: Secondary | ICD-10-CM | POA: Diagnosis not present

## 2017-05-03 ENCOUNTER — Encounter: Payer: Self-pay | Admitting: Nurse Practitioner

## 2017-05-03 ENCOUNTER — Ambulatory Visit (INDEPENDENT_AMBULATORY_CARE_PROVIDER_SITE_OTHER): Payer: Medicare Other | Admitting: Nurse Practitioner

## 2017-05-03 VITALS — BP 130/70 | HR 116 | Ht 71.0 in | Wt 346.0 lb

## 2017-05-03 DIAGNOSIS — E7800 Pure hypercholesterolemia, unspecified: Secondary | ICD-10-CM

## 2017-05-03 DIAGNOSIS — R0602 Shortness of breath: Secondary | ICD-10-CM | POA: Diagnosis not present

## 2017-05-03 DIAGNOSIS — I471 Supraventricular tachycardia: Secondary | ICD-10-CM

## 2017-05-03 DIAGNOSIS — I259 Chronic ischemic heart disease, unspecified: Secondary | ICD-10-CM | POA: Diagnosis not present

## 2017-05-03 DIAGNOSIS — I5022 Chronic systolic (congestive) heart failure: Secondary | ICD-10-CM | POA: Diagnosis not present

## 2017-05-03 MED ORDER — DILTIAZEM HCL 60 MG PO TABS
90.0000 mg | ORAL_TABLET | Freq: Three times a day (TID) | ORAL | 6 refills | Status: DC
Start: 2017-05-03 — End: 2017-07-04

## 2017-05-03 MED ORDER — SPIRONOLACTONE 25 MG PO TABS: 12.5000 mg | ORAL_TABLET | Freq: Every day | ORAL | 6 refills | Status: DC

## 2017-05-03 NOTE — Progress Notes (Signed)
CARDIOLOGY OFFICE NOTE  Date:  05/03/2017    Larry [name] Date of Birth: [birth year] Medical Record #[id number]  PCP:  Larry Landsman, MD  Cardiologist:  Larry [name]    Chief Complaint  Patient presents with  . Palpitations  . Irregular Heart Beat  . Coronary Artery Disease  . Congestive Heart Failure    Follow up visit - seen for Larry [name]    History of Present Illness: Larry [name] is a 67 y.o. male who presents today for a follow up visit. Seen for Larry [name].  He has an ischemic CM with severe diffuse 3VD and prior stents to the RCA in 7829, systolic and diastolic dysfunction, HTN, HLD and obesity. Has had a history of long standing atrial tach that has been treated with beta blockerand CCB therapy. Was hospitalized back in April of 2013 with a HF exacerbation - had stopped his medicines. Last EF of 40 - to 56% with diastolic dysfunction as well noted in January of 2015.   Seen by me back in October of 2014 - Was out of his medicines again. Had lost weight and was anemic - was sending to GI and ended up having colon cancer.   He was admitted (02/02/2013 - 03/01/2013) to [facility] with subacute history of progressive weakness, constipation and difficulty eating and drinking. He presented with acute renal failure (Creatinine 7.83) and severe anemia (Hbg of 7.3) as well as obstruction. Colonoscopy doing this admission with biopsy revealed adenocarcinoma and patient underwent exploratory lap with partial colectomy with colostomy on 12/23 by Larry [name]. Recovery was complicated by wound dehissance, requiring a return to the OR 12/29 with abdominal wound closure with VAC placment. After the procedure, the patient developed atrial tachycardia, so a dilt drip was started (12/29-1/2). On 1/4, the patient had a cardiac arrest, requiring defibrillation, and was subsequently transferred to the ICU. QT was [name] to be >700, with mild hypokalemia and  hypomagnesemia implicated as the cause. Lidocaine drip was started. Electrolyte abnormality treated and 2D-echo revealed an EF of 40-45% with periapical akinesis and biatrial enlargement. He experienced recurrent torsades on 01/05 and shocked in NSR. By 1/6, QTc had decreased to 499, lidocaine drip was d/c'ed, and patient was transferred out of ICU to stepdown. Then admitted to physical medicine and rehabilitation on 01/09 due to severe deconditioning. He was continued on protein supplement to promote wound healing. His abdominal wound was treated with VAC through 03/05/13. His heart rate was controlled without recurrent arrythmia and was maintained on potassium and magnesium supplements. He was discharged to home on 03/07/2013. He was last evaluated by Larry [name] on 02/03 with review of his pathology demonstrating invasive adenocarcinoma of the transverse colon with penetration into the pericolonic fat. He had 0/18 lymph nodes negative for metastatic disease. He was pathologic stage [id number].   Saw Dr. Gwenlyn [name] in early July of 2015 for a foot ulcer - basically normal ABI's noted at that time.   He was readmitted to the hospital in August of 2015 - triggered by too much salt. Presented with respiratory distress - treated with Bipap and quickly stabilized. Was diuresed about 6 liters as well. Started on low dose ACE. Sleep study recommended.  I have seen back here several times since that hospitalization and have basically followed him since - he got referred for a sleep study and is on CPAP - in March of 2016 - wanting to get his colostomy reversed and needing pre  op clearance.  Had a Myoview to risk stratify - this was high risk and I referred him on for cardiac cath with Dr. Burt [name]. Underwent PCI to the LAD with DES x 2. Committed to DAPT for one year ideally. He had to wait on colostomy reversal due to being on DAPT.   Seen at the cancer center back in [facility] 2017-HR elevated - asked to follow up  here - I increased his Coreg further. He ended up going in to the hospital for aweekend - still tachycardic - switched over to daily dosing of Verapamil and changed Coreg to Lopressor - looks like his ACE was stopped - pulmonary wanted him on ARB which was to be considered as an outpatient. Seen by pulmonary for a cough which led to a CT scan. HR in the 130's the whole time while in the ER. I then saw him back like 3 days later - he was feeling fine. Not short of breath. No more cough.   In November of 2017 - was having his tests done for planned reversal of his colostomy. Colostomy reversal occurred in February - did not go well - had an anastomotic lead on POD 7 - ended up with an ileostomy and required a prolonged stay at [facility] respiratory failure. He was seen by cardiology for intermittent atrial tachycardia.  I saw him back in July - he had been home for about of month - out of several of his medicines. No awareness of his heart beating fast. Last seen by me back in January - was doing ok. Weight was up - we increased his Lasix.   Comes in today. Here[place] today. He says he is doing ok. Does not feel his fast heart rate. He is not dizzy or lightheaded. No syncope. Notes that the nurse that comes to visit "sometimes does note it and sometimes does not". He is not short of breath. His swelling remains massive/chronic. No chest pain. Very sedentary but he is hoping that he will be able to get out as the weather improves. Needs handicap sticker - brought one with his wife's name. HR is fast today. Last echo last year with normal EF.   Past Medical History:  Diagnosis Date  . Arthritis   . Atrial tachycardia ([place])    managed on beta blocker therapy  . Childhood asthma    "went away after I was 16"  . Chronic combined systolic and diastolic CHF, NYHA class 3 (HCC)    has diastolic heart failure grade 1; EF is 45 to 50% per echo 05/2011; EF 41% by Myoview 2016  . CKD (chronic kidney  disease) stage 3, GFR 30-59 ml/min (HCC)   . Colon cancer ([place]) 2015   MSI high; IHC loss of MLH1 and PMS2; BRAF negative; Negative methylation  . COPD (chronic obstructive pulmonary disease) ([place])   . Coronary artery disease   . Family history of ovarian cancer   . Hypercholesteremia   . Hypertension   . Noncompliance   . NSVT (nonsustained ventricular tachycardia) (HCC)    beta blocker restarted  . Obesity   . OSA on CPAP    used nightly, pt does not know settings  . Peripheral arterial disease (HCC)    4.2 cm thoracic aortic aneurysm per chest ct 11-14-15 epic  . S/P colostomy ([place])    2014  . Thrombocytopenia ([place])   . Torsades de pointes ([place])    X 2 episodes during hospital visit 12'14"electrolyte imbalance"- "Shocked"  Past Surgical History:  Procedure Laterality Date  . COLON SURGERY    . COLONOSCOPY N/A 02/08/2013   Procedure: COLONOSCOPY;  Surgeon: [name], MD;  Location: [place];  Service: Endoscopy;  Laterality: N/A;  . COLONOSCOPY WITH PROPOFOL N/A 05/09/2014   Procedure: COLONOSCOPY WITH PROPOFOL;  Surgeon: [name], MD;  Location: [facility];  Service: Endoscopy;  Laterality: N/A;  . COLONOSCOPY WITH PROPOFOL N/A 01/08/2016   Procedure: COLONOSCOPY WITH PROPOFOL;  Surgeon: [name], MD;  Location: [facility];  Service: Endoscopy;  Laterality: N/A;  . COLOSTOMY N/A 04/19/2016   Procedure: COLOSTOMY;  Surgeon: Judeth Horn, MD;  Location: [place];  Service: General;  Laterality: N/A;  . COLOSTOMY REVERSAL  04/12/2016  . COLOSTOMY REVERSAL N/A 04/12/2016   Procedure: COLOSTOMY REVERSAL;  Surgeon: Judeth Horn, MD;  Location: [place];  Service: General;  Laterality: N/A;  . CORONARY ANGIOPLASTY WITH STENT PLACEMENT  11/12/2008; 06/11/2014   stent x 2 to RCA; stent x 2 to LAD  . CORONARY ANGIOPLASTY WITH STENT PLACEMENT  06/11/2014   m-LAD 3.5 x 16 mm Synergy DES, d-LAD  2.25 x 16 mm Synergy DES  . ESOPHAGOGASTRODUODENOSCOPY N/A 02/08/2013    Procedure: ESOPHAGOGASTRODUODENOSCOPY (EGD);  Surgeon: [name], MD;  Location: [facility] ENDOSCOPY;  Service: Endoscopy;  Laterality: N/A;  . FLEXIBLE SIGMOIDOSCOPY N/A 02/19/2016   Procedure: FLEXIBLE SIGMOIDOSCOPY;  Surgeon: [name], MD;  Location: [facility];  Service: Endoscopy;  Laterality: N/A;  . LAPAROTOMY N/A 02/12/2013   Procedure: EXPLORATORY LAPAROTOMY PARTIAL COLECTOMY WITH COLOSTOMY;  Surgeon: [name], MD;  Location: [place];  Service: General;  Laterality: N/A;  . LAPAROTOMY N/A 02/18/2013   Procedure: EXPLORATORY LAPAROTOMY/Closure of Wound;  Surgeon: [name], MD;  Location: [place];  Service: General;  Laterality: N/A;  . LAPAROTOMY N/A 04/19/2016   Procedure: EXPLORATORY LAPAROTOMY, REPAIR OF ANASTAMOTIC LEAK;  Surgeon: Judeth Horn, MD;  Location: [place];  Service: General;  Laterality: N/A;  . LEFT HEART CATHETERIZATION WITH CORONARY ANGIOGRAM N/A 06/11/2014   Procedure: LEFT HEART CATHETERIZATION WITH CORONARY ANGIOGRAM;  Surgeon: [name], MD; CFX calcified, 30-40 percent, RCA calcified, 40/50/40%, PDA diffuse disease, LAD 40/75/90% s/p DES 2      Medications: Current Meds  Medication Sig  . aspirin EC 81 MG tablet Take 1 tablet (81 mg total) by mouth daily.  [name] Kitchen atorvastatin (LIPITOR) 40 MG tablet Take 1 tablet (40 mg total) by mouth daily.  . clopidogrel (PLAVIX) 75 MG tablet Take 75 mg by mouth daily.  . ferrous sulfate 325 (65 FE) MG tablet Take 1 tablet (325 mg total) by mouth daily with breakfast.  . furosemide (LASIX) 40 MG tablet Take 2 tablets (80 mg total) by mouth daily. To take 80 mg daily for one week - then decrease to 60 mg a day.  . levalbuterol (XOPENEX HFA) 45 MCG/ACT inhaler INHALE 2 PUFFS INTO THE LUNGS EVERY 4 (FOUR) HOURS AS NEEDED FOR WHEEZING.  . metoprolol tartrate (LOPRESSOR) 25 MG tablet Take 1.5 tablets (37.5 mg total) by mouth 2 (two) times daily.  . Multiple Vitamins-Minerals (MULTIVITAMIN WITH MINERALS) tablet Take 1  tablet by mouth daily.  . nitroGLYCERIN (NITROSTAT) 0.4 MG SL tablet Place 0.4 mg under the tongue every 5 (five) minutes as needed for chest pain.  . simethicone (MYLICON) 195 MG chewable tablet Chew 125 mg by mouth 3 (three) times daily.   . [DISCONTINUED] clopidogrel (PLAVIX) 75 MG tablet TAKE 1 TABLET (75 MG TOTAL) BY MOUTH DAILY WITH  BREAKFAST. (Patient taking differently: Take 75 mg by mouth daily. TAKE 1 TABLET (75 MG TOTAL) BY MOUTH DAILY WITH BREAKFAST.)  . [DISCONTINUED] diltiazem (CARDIZEM) 60 MG tablet Take 1 tablet (60 mg total) by mouth 3 (three) times daily.     Allergies: Allergies  Allergen Reactions  . No Known Allergies     Social History: The patient  reports that he quit smoking about 8 years ago. His smoking use included cigarettes. He has a 38.00 pack-year smoking history. he has never used smokeless tobacco. He reports that he drinks alcohol. He reports that he does not use drugs.   Family History: The patient's family history includes Dementia in his mother; Diabetes in his father; Heart disease in his father; Hypertension in his father and mother; Leukemia in his maternal uncle; Liver cancer in his maternal grandmother; Parkinsonism in his mother; Prostate cancer in his maternal uncle.   Review of Systems: Please see the history of present illness.   Otherwise, the review of systems is positive for none.   All other systems are reviewed and negative.   Physical Exam: VS:  BP 130/70   Pulse (!) 116   Ht 5' 11"  (1.803 m)   Wt (!) 346 lb (156.9 kg)   SpO2 97%   BMI 48.26 kg/m  .  BMI Body mass index is 48.26 kg/m.  Wt Readings from Last 3 Encounters:  05/03/17 (!) 346 lb (156.9 kg)  02/22/17 (!) 336 lb 12.8 oz (152.8 kg)  09/20/16 (!) 320 lb 6.4 oz (145.3 kg)    General: Pleasant. Morbidly obese. Alert and in no acute distress. His weight is up 10 more pounds. Up 26 pounds since July.  HEENT: Normal.  Neck: Thick.  Cardiac: Regular rate and rhythm but  fast. Massive edema. Large areas of crusting that is chronic.  Respiratory:  Lungs are clear to auscultation bilaterally with normal work of breathing.  GI: Soft and nontender.  MS: No deformity or atrophy. Gait and ROM intact.  Skin: Warm and dry. Color is normal.  Neuro:  Strength and sensation are intact and no gross focal deficits noted.  Psych: Alert, appropriate and with normal affect.   LABORATORY DATA:  EKG:  EKG is ordered today. Looks again to show atrial tach - HR is 108  Lab Results  Component Value Date   WBC 5.7 02/22/2017   HGB 12.9 (L) 02/22/2017   HCT 38.7 02/22/2017   PLT 155 02/22/2017   GLUCOSE 92 03/02/2017   CHOL 168 06/29/2015   TRIG 246 (H) 05/02/2016   HDL 47 06/29/2015   LDLCALC 93 06/29/2015   ALT 18 10/06/2016   AST 17 10/06/2016   NA 141 03/02/2017   K 3.9 03/02/2017   CL 102 03/02/2017   CREATININE 2.15 (H) 03/02/2017   BUN 24 03/02/2017   CO2 21 03/02/2017   TSH 1.080 05/14/2016   INR 1.12 05/14/2016   HGBA1C 5.9 (H) 05/14/2016     BNP (last 3 results) No results for input(s): BNP in the last 8760 hours.  ProBNP (last 3 results) Recent Labs    02/22/17 0926  PROBNP 87     Other Studies Reviewed Today:  EchoStudy Conclusions2/2018  - Left ventricle: The cavity size was normal. Wall thickness was increased in a pattern of moderate LVH. Systolic function was normal. The estimated ejection fraction was in the range of 55% to 60%. Wall motion was normal; there were no regional wall motion abnormalities. The study is not technically  sufficient to allow evaluation of LV diastolic function. - Aortic root: The aortic root was mildly dilated. - Mitral valve: Calcified annulus.  Impressions:  - Technically difficult; definity used; normal LV function; moderate LVH.   MyoviewStudy Highlights12/2017   Nuclear stress EF: 58%.  There was no ST segment deviation noted during stress.  There is a large defect  of severe severity present in the basal inferior, mid inferior, apical septal, apical inferior and apex location. The defect is non-reversible. No ischemia noted. Tthis is consistent with diaphragmatic attenuation artifact vs. Scar.  This is a low risk study.  The left ventricular ejection fraction is normal (55-65%).      Procedure: Left Heart Cath, Selective Coronary Angiography, PTCA and stenting of the mid and distal LAD April 2016.  Coronary angiography: Left mainstem: The left mainstem is patent. The vessel divides into the LAD and left circumflex. There is no significant stenosis, but there is mild irregularity.  Left anterior descending (LAD): The LAD is moderately calcified. The vessel is severely diseased. The proximal LAD is patent with 30-40% stenosis after the second septal perforator. There is diffuse calcification. The diagonal branches are small. The mid LAD has an eccentric 75% stenosis at the origin of the second diagonal branch. Beyond that area the mid and distal LAD are diffusely diseased. There is another severe stenosis in the apical LAD of 90%.  Left circumflex (LCx): The left circumflex is calcified. The vessel is patent with mild irregularity. There are no high-grade stenoses identified. There are scattered 30-40% stenoses throughout the proximal and mid circumflex as well as the first OM branch.  Right coronary artery (RCA): The RCA is dominant. The vessel is heavily calcified. The stented segments in the mid and distal RCA are patent. The proximal vessel has 30-40% stenosis. The mid vessel has 30-40% stenosis. The distal vessel is patent with 50-60% stenosis involving the origin of the PDA. The PDA is diffusely diseased.  Left ventriculography: Deferred because of chronic kidney disease. By nuclear scan the LVEF is 41%.  PCI Note: Following the diagnostic procedure, the decision was made to proceed with PCI of the mid and apical LAD. The right  coronary artery had patent stents in the left circumflex had nonobstructive disease. The LAD is a diffusely diseased vessel and the mid and distal vessel would be a poor surgical targets. I felt that PCI was the best option in this patient with a high risk nuclear scan demonstrating anteroapical and anterolateral ischemia. He will require colostomy reversal, so I planned on treating him with Synergy drug-eluting stents which had a biodegradable polymer and potentially can allow for earlier interruption of dual antiplatelet therapy. The patient was loaded with Plavix 600 mg. Weight-based bivalirudin was given for anticoagulation. Once a therapeutic ACT was achieved, a 6 [place] XB LAD 3.5 cm guide catheter was inserted. A cougar coronary guidewire was used to cross the lesion in the apical LAD. The lesion was predilated with a 2.5 x 12 mm balloon. The same balloon was used to dilate the mid lesion. The apical lesion was then stented with a 2.25 x 16 mm Synergy DES. The stent was postdilated with a 2.5 mm noncompliant balloon. The mid lesion was then stented with a 3.5 x 16 mm Synergy DES. That stent was postdilated with a 3.75 mm noncompliant balloon. Following PCI, there was 0% residual stenosis and TIMI-3 flow. Final angiography confirmed an excellent result. The patient tolerated the procedure well. There were no immediate procedural complications.  A TR band was used for radial hemostasis. The patient was transferred to the post catheterization recovery area for further monitoring.  PCI Data: Lesion 1: Vessel - LAD/Segment - distal/apical Percent Stenosis (pre) 90 TIMI-flow 3 Stent 2.25 x 16 mm Synergy DES Percent Stenosis (post) 0 TIMI-flow (post) 3  Lesion 2: Vessel - LAD/Segment - mid Percent Stenosis (pre) 75 TIMI-flow 3 Stent 3.5 x 16 mm Synergy DES Percent Stenosis (post) 0 TIMI-flow (post) 3  Estimated Blood Loss: Minimal  Final Conclusions:  1. Two-vessel coronary artery  disease with continued patency of the stented segments in the right coronary artery and severe stenoses of the mid and apical LAD 2. Mild diffuse nonobstructive left circumflex stenosis 3. Successful PCI of the LAD using 2 drug-eluting stents   Recommendations:  Dual antiplatelet therapy for at least 6 months. Would be reasonable to consider interruption of aspirin and Plavix at 6 months for colostomy reversal.  [name] MD, [facility] 06/11/2014, 10:06 AM   Assessment / Plan:  1. CAD with prior PCI to the RCA - S/P cath with PCI to the LAD back in April of 2016 following high risk Myoview - has had DES x 2 to the LAD in 2016 - he remains on aspirin/Plavix.Follow up Myoview from 01/2016 was low risk. Needs CV risk factor modification but this is challenging at best. Handicap form given today.   2.History of combined systolic and diastolic HF -last echo from 2018 with normal EF now noted. Back on his Lasix. His weight continues to climb. Swelling remains chronically just massive - will try adding Aldactone. ?how much CCB therapy is playing a role and unfortunately, HR is not controlled. I think our options are getting limited.   3. Colon cancer - followed by oncology -has had attempt at colostomy reversal now with ileostomy in place. This will be long term.   4. HTN - stable on present regimen.  5. CKD - off ACE. Getting labs today.   6. OSA - now on CPAP and followed by Dr. Annamaria Boots- using sporadically.  7. HLD - on statin therapy- checking lab today  8. Atrial tachycardia - chronic issue -he has beentotally asymptomatic. He is back on low dose beta blocker and CCB therapy - HR is up today - I would like to get him back to Larry [name] for his input/further recommendations. I have increased his CCB to 90 mg TID for now. Adding Aldactone as well to help counteract the swelling.    Current medicines are reviewed with the patient today.  The patient does not have  concerns regarding medicines other than what has been noted above.  The following changes have been made:  See above.  Labs/ tests ordered today include:    Orders Placed This Encounter  Procedures  . Basic metabolic panel  . CBC  . Hepatic function panel  . Lipid panel  . Pro b natriuretic peptide (BNP)  . EKG 12-Lead     Disposition:   FU with Larry [name] in a couple of weeks. I will see back in about 3 months. I think his prognosis going forward is tenuous at best.   Patient is agreeable to this plan and will call if any problems develop in the interim.   SignedTruitt Merle, NP  05/03/2017 9:47 AM  [facility] [address] [place], [place]  [address] Phone: [phone] Fax: [phone]

## 2017-05-03 NOTE — Patient Instructions (Addendum)
We will be checking the following labs today - BMET, CBC, BNP, HPF and lipids  BMET in one week   Medication Instructions:    Continue with your current medicines. BUT  I am adding Aldactone 25 mg to take just 1/2 tablet daily  I am increasing the Diltiazem to 90 mg three times a day - this will be a pill and a half three times a day    Testing/Procedures To Be Arranged:  N/A  Follow-Up:   See Dr. Rayann Heman in a few weeks 05/18/17 ARRIVE AT 8:45   See me in about 4 months 09/06/17 ARRIVE AT 8:45    Other Special Instructions:   N/A    If you need a refill on your cardiac medications before your next appointment, please call your pharmacy.   Call the [place] office at [phone] if you have any questions, problems or concerns.

## 2017-05-04 LAB — CBC
Hematocrit: 40 % (ref 37.5–51.0)
Hemoglobin: 13.2 g/dL (ref 13.0–17.7)
MCH: 28.6 pg (ref 26.6–33.0)
MCHC: 33 g/dL (ref 31.5–35.7)
MCV: 87 fL (ref 79–97)
Platelets: 147 10*3/uL — ABNORMAL LOW (ref 150–379)
RBC: 4.61 x10E6/uL (ref 4.14–5.80)
RDW: 15.7 % — ABNORMAL HIGH (ref 12.3–15.4)
WBC: 5.8 10*3/uL (ref 3.4–10.8)

## 2017-05-04 LAB — LIPID PANEL
Chol/HDL Ratio: 2.7 ratio (ref 0.0–5.0)
Cholesterol, Total: 148 mg/dL (ref 100–199)
HDL: 54 mg/dL (ref >39)
LDL Calculated: 60 mg/dL (ref 0–99)
Triglycerides: 171 mg/dL — ABNORMAL HIGH (ref 0–149)
VLDL Cholesterol Cal: 34 mg/dL (ref 5–40)

## 2017-05-04 LAB — BASIC METABOLIC PANEL
BUN/Creatinine Ratio: 14 (ref 10–24)
BUN: 28 mg/dL — ABNORMAL HIGH (ref 8–27)
CO2: 24 mmol/L (ref 20–29)
Calcium: 10.2 mg/dL (ref 8.6–10.2)
Chloride: 102 mmol/L (ref 96–106)
Creatinine, Ser: 1.96 mg/dL — ABNORMAL HIGH (ref 0.76–1.27)
GFR calc Af Amer: 41 mL/min/{1.73_m2} — ABNORMAL LOW (ref >59)
GFR calc non Af Amer: 35 mL/min/{1.73_m2} — ABNORMAL LOW (ref >59)
Glucose: 87 mg/dL (ref 65–99)
Potassium: 3.8 mmol/L (ref 3.5–5.2)
Sodium: 143 mmol/L (ref 134–144)

## 2017-05-04 LAB — HEPATIC FUNCTION PANEL
ALT: 22 IU/L (ref 0–44)
AST: 16 IU/L (ref 0–40)
Albumin: 4.4 g/dL (ref 3.6–4.8)
Alkaline Phosphatase: 114 IU/L (ref 39–117)
Bilirubin Total: 1 mg/dL (ref 0.0–1.2)
Bilirubin, Direct: 0.21 mg/dL (ref 0.00–0.40)
Total Protein: 7.2 g/dL (ref 6.0–8.5)

## 2017-05-04 LAB — PRO B NATRIURETIC PEPTIDE: NT-Pro BNP: 126 pg/mL (ref 0–210)

## 2017-05-05 ENCOUNTER — Telehealth: Payer: Self-pay | Admitting: Nurse Practitioner

## 2017-05-05 DIAGNOSIS — Z433 Encounter for attention to colostomy: Secondary | ICD-10-CM | POA: Diagnosis not present

## 2017-05-05 DIAGNOSIS — N183 Chronic kidney disease, stage 3 (moderate): Secondary | ICD-10-CM | POA: Diagnosis not present

## 2017-05-05 DIAGNOSIS — I2510 Atherosclerotic heart disease of native coronary artery without angina pectoris: Secondary | ICD-10-CM | POA: Diagnosis not present

## 2017-05-05 DIAGNOSIS — I130 Hypertensive heart and chronic kidney disease with heart failure and stage 1 through stage 4 chronic kidney disease, or unspecified chronic kidney disease: Secondary | ICD-10-CM | POA: Diagnosis not present

## 2017-05-05 DIAGNOSIS — I5042 Chronic combined systolic (congestive) and diastolic (congestive) heart failure: Secondary | ICD-10-CM | POA: Diagnosis not present

## 2017-05-05 DIAGNOSIS — Z48815 Encounter for surgical aftercare following surgery on the digestive system: Secondary | ICD-10-CM | POA: Diagnosis not present

## 2017-05-05 NOTE — Telephone Encounter (Signed)
New Message   [facility] would like to confirm the medication changes that the patient had today during his office visit. Please call to discuss.

## 2017-05-05 NOTE — Telephone Encounter (Signed)
S/w [name] to confirm pt's medication changes made at [facility].

## 2017-05-08 DIAGNOSIS — N183 Chronic kidney disease, stage 3 (moderate): Secondary | ICD-10-CM | POA: Diagnosis not present

## 2017-05-08 DIAGNOSIS — Z48815 Encounter for surgical aftercare following surgery on the digestive system: Secondary | ICD-10-CM | POA: Diagnosis not present

## 2017-05-08 DIAGNOSIS — Z433 Encounter for attention to colostomy: Secondary | ICD-10-CM | POA: Diagnosis not present

## 2017-05-08 DIAGNOSIS — I5042 Chronic combined systolic (congestive) and diastolic (congestive) heart failure: Secondary | ICD-10-CM | POA: Diagnosis not present

## 2017-05-08 DIAGNOSIS — I2510 Atherosclerotic heart disease of native coronary artery without angina pectoris: Secondary | ICD-10-CM | POA: Diagnosis not present

## 2017-05-08 DIAGNOSIS — I130 Hypertensive heart and chronic kidney disease with heart failure and stage 1 through stage 4 chronic kidney disease, or unspecified chronic kidney disease: Secondary | ICD-10-CM | POA: Diagnosis not present

## 2017-05-10 ENCOUNTER — Telehealth: Payer: Self-pay

## 2017-05-10 ENCOUNTER — Other Ambulatory Visit: Payer: Medicare Other | Admitting: *Deleted

## 2017-05-10 DIAGNOSIS — I5022 Chronic systolic (congestive) heart failure: Secondary | ICD-10-CM | POA: Diagnosis not present

## 2017-05-10 DIAGNOSIS — Z79899 Other long term (current) drug therapy: Secondary | ICD-10-CM

## 2017-05-10 LAB — BASIC METABOLIC PANEL
BUN/Creatinine Ratio: 14 (ref 10–24)
BUN: 27 mg/dL (ref 8–27)
CO2: 22 mmol/L (ref 20–29)
Calcium: 9.7 mg/dL (ref 8.6–10.2)
Chloride: 104 mmol/L (ref 96–106)
Creatinine, Ser: 2 mg/dL — ABNORMAL HIGH (ref 0.76–1.27)
GFR calc Af Amer: 40 mL/min/{1.73_m2} — ABNORMAL LOW (ref >59)
GFR calc non Af Amer: 34 mL/min/{1.73_m2} — ABNORMAL LOW (ref >59)
Glucose: 97 mg/dL (ref 65–99)
Potassium: 4.2 mmol/L (ref 3.5–5.2)
Sodium: 140 mmol/L (ref 134–144)

## 2017-05-10 NOTE — Telephone Encounter (Signed)
-----   Message from Burtis Junes, NP sent at 05/10/2017  4:15 PM EDT ----- Ok to report. Kidney function is stable - stay on current regimen for now. Seeing Dr. Rayann Heman next week Plan to recheck BMET at that visit please.

## 2017-05-12 DIAGNOSIS — N183 Chronic kidney disease, stage 3 (moderate): Secondary | ICD-10-CM | POA: Diagnosis not present

## 2017-05-12 DIAGNOSIS — Z48815 Encounter for surgical aftercare following surgery on the digestive system: Secondary | ICD-10-CM | POA: Diagnosis not present

## 2017-05-12 DIAGNOSIS — I5042 Chronic combined systolic (congestive) and diastolic (congestive) heart failure: Secondary | ICD-10-CM | POA: Diagnosis not present

## 2017-05-12 DIAGNOSIS — I2510 Atherosclerotic heart disease of native coronary artery without angina pectoris: Secondary | ICD-10-CM | POA: Diagnosis not present

## 2017-05-12 DIAGNOSIS — I130 Hypertensive heart and chronic kidney disease with heart failure and stage 1 through stage 4 chronic kidney disease, or unspecified chronic kidney disease: Secondary | ICD-10-CM | POA: Diagnosis not present

## 2017-05-12 DIAGNOSIS — Z433 Encounter for attention to colostomy: Secondary | ICD-10-CM | POA: Diagnosis not present

## 2017-05-15 DIAGNOSIS — I2510 Atherosclerotic heart disease of native coronary artery without angina pectoris: Secondary | ICD-10-CM | POA: Diagnosis not present

## 2017-05-15 DIAGNOSIS — Z433 Encounter for attention to colostomy: Secondary | ICD-10-CM | POA: Diagnosis not present

## 2017-05-15 DIAGNOSIS — I5042 Chronic combined systolic (congestive) and diastolic (congestive) heart failure: Secondary | ICD-10-CM | POA: Diagnosis not present

## 2017-05-15 DIAGNOSIS — N183 Chronic kidney disease, stage 3 (moderate): Secondary | ICD-10-CM | POA: Diagnosis not present

## 2017-05-15 DIAGNOSIS — I130 Hypertensive heart and chronic kidney disease with heart failure and stage 1 through stage 4 chronic kidney disease, or unspecified chronic kidney disease: Secondary | ICD-10-CM | POA: Diagnosis not present

## 2017-05-15 DIAGNOSIS — Z48815 Encounter for surgical aftercare following surgery on the digestive system: Secondary | ICD-10-CM | POA: Diagnosis not present

## 2017-05-18 ENCOUNTER — Ambulatory Visit (INDEPENDENT_AMBULATORY_CARE_PROVIDER_SITE_OTHER): Payer: Medicare Other | Admitting: Internal Medicine

## 2017-05-18 ENCOUNTER — Other Ambulatory Visit: Payer: Medicare Other

## 2017-05-18 ENCOUNTER — Encounter: Payer: Self-pay | Admitting: Internal Medicine

## 2017-05-18 VITALS — BP 122/78 | HR 97 | Ht 71.0 in | Wt 337.0 lb

## 2017-05-18 DIAGNOSIS — I471 Supraventricular tachycardia: Secondary | ICD-10-CM

## 2017-05-18 DIAGNOSIS — I5022 Chronic systolic (congestive) heart failure: Secondary | ICD-10-CM | POA: Diagnosis not present

## 2017-05-18 DIAGNOSIS — G4733 Obstructive sleep apnea (adult) (pediatric): Secondary | ICD-10-CM

## 2017-05-18 DIAGNOSIS — Z79899 Other long term (current) drug therapy: Secondary | ICD-10-CM | POA: Diagnosis not present

## 2017-05-18 DIAGNOSIS — I10 Essential (primary) hypertension: Secondary | ICD-10-CM | POA: Diagnosis not present

## 2017-05-18 DIAGNOSIS — I259 Chronic ischemic heart disease, unspecified: Secondary | ICD-10-CM

## 2017-05-18 LAB — BASIC METABOLIC PANEL
BUN/Creatinine Ratio: 17 (ref 10–24)
BUN: 37 mg/dL — ABNORMAL HIGH (ref 8–27)
CO2: 18 mmol/L — ABNORMAL LOW (ref 20–29)
Calcium: 10 mg/dL (ref 8.6–10.2)
Chloride: 103 mmol/L (ref 96–106)
Creatinine, Ser: 2.18 mg/dL — ABNORMAL HIGH (ref 0.76–1.27)
GFR calc Af Amer: 36 mL/min/{1.73_m2} — ABNORMAL LOW (ref >59)
GFR calc non Af Amer: 31 mL/min/{1.73_m2} — ABNORMAL LOW (ref >59)
Glucose: 91 mg/dL (ref 65–99)
Potassium: 4.6 mmol/L (ref 3.5–5.2)
Sodium: 138 mmol/L (ref 134–144)

## 2017-05-18 NOTE — Patient Instructions (Addendum)
Medication Instructions:  Your physician has recommended you make the following change in your medication:  1.  Increase your metoprolol to 50 mg (two tablets) by mouth twice a day. 2.  Decrease your Cardizem to 60 mg (one tablet) by mouth three times a day.  Labwork: You will get a BMP today.   Testing/Procedures: None ordered.  Follow-Up: Follow up with Larry [name] as scheduled September 06, 2017 at 9 am.  Any Other Special Instructions Will Be Listed Below (If Applicable).  If you need a refill on your cardiac medications before your next appointment, please call your pharmacy.

## 2017-05-18 NOTE — Progress Notes (Signed)
Electrophysiology Office Note   Date:  05/18/2017   ID:  Larry, [name] 07-Jul-1950, MRN [id number]  PCP:  Larry Landsman, MD   Primary Electrophysiologist: Larry Grayer, MD    CC: palpitations   History of Present Illness: Larry [name] is a 67 y.o. male who presents today for electrophysiology evaluation.   I have not seen him since 2014.  He has been following with Larry [name].  He has occasional ectopic atrial tachycardia as well as sinus tachycardia.  He has been asymptomatic.  Medical therapy has previously included beta blockers and calcium channel blockers.  He has chronic venous stasis ulcers for which he is followed by vascular surgery.   This has reduced enthusiasm for calcium channel blockers.  He had prolonged qt and torsades several years ago in the setting of profound electrolyte abnormality post operatively after colectomy. He recently went for colostomy reversible which was unsuccessful.  He now has an abdominal wound which requires packing.  He says that this limits him more than anything currently.  Today, he denies symptoms of palpitations, chest pain, shortness of breath, orthopnea, PND,  dizziness, presyncope, syncope, bleeding, or neurologic sequela. The patient is tolerating medications without difficulties and is otherwise without complaint today.    Past Medical History:  Diagnosis Date  . Arthritis   . Atrial tachycardia (Larry [name])    managed on beta blocker therapy  . Childhood asthma    "went away after I was 16"  . Chronic combined systolic and diastolic CHF, NYHA class 3 (HCC)    has diastolic heart failure grade 1; EF is 45 to 50% per echo 05/2011; EF 41% by Myoview 2016  . CKD (chronic kidney disease) stage 3, GFR 30-59 ml/min (HCC)   . Colon cancer ([place]) 2015   MSI high; IHC loss of MLH1 and PMS2; BRAF negative; Negative methylation  . COPD (chronic obstructive pulmonary disease) ([place])   . Coronary artery disease   . Family history of  ovarian cancer   . Hypercholesteremia   . Hypertension   . Noncompliance   . NSVT (nonsustained ventricular tachycardia) (HCC)    beta blocker restarted  . Obesity   . OSA on CPAP    used nightly, pt does not know settings  . Peripheral arterial disease (HCC)    4.2 cm thoracic aortic aneurysm per chest ct 11-14-15 epic  . S/P colostomy ([place])    2014  . Thrombocytopenia ([place])   . Torsades de pointes ([place])    X 2 episodes during hospital visit 12'14"electrolyte imbalance"- "Shocked"   Past Surgical History:  Procedure Laterality Date  . COLON SURGERY    . COLONOSCOPY N/A 02/08/2013   Procedure: COLONOSCOPY;  Surgeon: Larry Beams, MD;  Location: [place];  Service: Endoscopy;  Laterality: N/A;  . COLONOSCOPY WITH PROPOFOL N/A 05/09/2014   Procedure: COLONOSCOPY WITH PROPOFOL;  Surgeon: Larry Ada, MD;  [facility];  Service: Endoscopy;  Laterality: N/A;  . COLONOSCOPY WITH PROPOFOL N/A 01/08/2016   Procedure: COLONOSCOPY WITH PROPOFOL;  Surgeon: Larry Ada, MD;  [facility];  Service: Endoscopy;  Laterality: N/A;  . COLOSTOMY N/A 04/19/2016   Procedure: COLOSTOMY;  Surgeon: Larry Horn, MD;  Location: [facility];  Service: General;  Laterality: N/A;  . COLOSTOMY REVERSAL  04/12/2016  . COLOSTOMY REVERSAL N/A 04/12/2016   Procedure: COLOSTOMY REVERSAL;  Surgeon: Larry Horn, MD;  Location: [facility];  Service: General;  Laterality: N/A;  . CORONARY ANGIOPLASTY WITH STENT PLACEMENT  11/12/2008; 06/11/2014   stent x 2 to RCA; stent x 2 to LAD  . CORONARY ANGIOPLASTY WITH STENT PLACEMENT  06/11/2014   m-LAD 3.5 x 16 mm Synergy DES, d-LAD  2.25 x 16 mm Synergy DES  . ESOPHAGOGASTRODUODENOSCOPY N/A 02/08/2013   Procedure: ESOPHAGOGASTRODUODENOSCOPY (EGD);  Surgeon: Larry Beams, MD;  Location: [facility] ENDOSCOPY;  Service: Endoscopy;  Laterality: N/A;  . FLEXIBLE SIGMOIDOSCOPY N/A 02/19/2016   Procedure: FLEXIBLE SIGMOIDOSCOPY;  Surgeon: Larry Ada, MD;  [facility];  Service: Endoscopy;  Laterality: N/A;  . LAPAROTOMY N/A 02/12/2013   Procedure: EXPLORATORY LAPAROTOMY PARTIAL COLECTOMY WITH COLOSTOMY;  Surgeon: Larry Ober, MD;  Location: [facility];  Service: General;  Laterality: N/A;  . LAPAROTOMY N/A 02/18/2013   Procedure: EXPLORATORY LAPAROTOMY/Closure of Wound;  Surgeon: Larry Ok, MD;  Location: [place];  Service: General;  Laterality: N/A;  . LAPAROTOMY N/A 04/19/2016   Procedure: EXPLORATORY LAPAROTOMY, REPAIR OF ANASTAMOTIC LEAK;  Surgeon: Larry Horn, MD;  Location: [facility];  Service: General;  Laterality: N/A;  . LEFT HEART CATHETERIZATION WITH CORONARY ANGIOGRAM N/A 06/11/2014   Procedure: LEFT HEART CATHETERIZATION WITH CORONARY ANGIOGRAM;  Surgeon: Larry Mocha, MD; CFX calcified, 30-40 percent, RCA calcified, 40/50/40%, PDA diffuse disease, LAD 40/75/90% s/p DES 2      Current Outpatient Medications  Medication Sig Dispense Refill  . aspirin EC 81 MG tablet Take 1 tablet (81 mg total) by mouth daily. 30 tablet 0  . atorvastatin (LIPITOR) 40 MG tablet Take 1 tablet (40 mg total) by mouth daily. 90 tablet 3  . clopidogrel (PLAVIX) 75 MG tablet Take 75 mg by mouth daily.    [name] Kitchen diltiazem (CARDIZEM) 60 MG tablet Take 1.5 tablets (90 mg total) by mouth 3 (three) times daily. 150 tablet 6  . ferrous sulfate 325 (65 FE) MG tablet Take 1 tablet (325 mg total) by mouth daily with breakfast. 30 tablet 1  . furosemide (LASIX) 40 MG tablet Take 2 tablets (80 mg total) by mouth daily. To take 80 mg daily for one week - then decrease to 60 mg a day. 135 tablet 3  . levalbuterol (XOPENEX HFA) 45 MCG/ACT inhaler INHALE 2 PUFFS INTO THE LUNGS EVERY 4 (FOUR) HOURS AS NEEDED FOR WHEEZING. 15 Inhaler 3  . metoprolol tartrate (LOPRESSOR) 25 MG tablet Take 1.5 tablets (37.5 mg total) by mouth 2 (two) times daily. 270 tablet 3  . Multiple Vitamins-Minerals (MULTIVITAMIN WITH MINERALS) tablet Take 1 tablet by mouth daily.    . nitroGLYCERIN (NITROSTAT)  0.4 MG SL tablet Place 0.4 mg under the tongue every 5 (five) minutes as needed for chest pain.    . simethicone (MYLICON) 364 MG chewable tablet Chew 125 mg by mouth 3 (three) times daily.     [name] Kitchen spironolactone (ALDACTONE) 25 MG tablet Take 0.5 tablets (12.5 mg total) by mouth daily. 30 tablet 6   No current facility-administered medications for this visit.     Allergies:   No known allergies   Social History:  The patient  reports that he quit smoking about 8 years ago. His smoking use included cigarettes. He has a 38.00 pack-year smoking history. He has never used smokeless tobacco. He reports that he drinks alcohol. He reports that he does not use drugs.   Family History:  The patient's  family history includes Dementia in his mother; Diabetes in his father; Heart disease in his father; Hypertension in his father and mother; Leukemia in his maternal uncle; Liver cancer in his  maternal grandmother; Parkinsonism in his mother; Prostate cancer in his maternal uncle.    ROS:  Please see the history of present illness.   All other systems are personally reviewed and negative.    PHYSICAL EXAM: VS:  BP 122/78   Pulse 97   Ht 5' 11" (1.803 m)   Wt (!) 152.9 kg (337 lb)   BMI 47.00 kg/m  , BMI Body mass index is 47 kg/m. GEN: overweight in no acute distress  HEENT: normal  Neck: no JVD, carotid bruits, or masses Cardiac: RRR; no murmurs, rubs, or gallops,no edema  Respiratory:  clear to auscultation bilaterally, normal work of breathing GI: soft, abdominal wound and ostomy noted MS: no deformity or atrophy  Skin:  Marked venous stasis ulcers on his legs Neuro:  Strength and sensation are intact Psych: euthymic mood, full affect  EKG:  EKG is ordered today. The ekg ordered today is personally reviewed and shows sinus rhythm with PACs, PVCs   Recent Labs: 06/08/2016: Magnesium 2.0 05/03/2017: ALT 22; Hemoglobin 13.2; NT-Pro BNP 126; Platelets 147 05/10/2017: BUN 27; Creatinine, Ser  2.00; Potassium 4.2; Sodium 140  personally reviewed   Lipid Panel     Component Value Date/Time   CHOL 148 05/03/2017 1021   TRIG 171 (H) 05/03/2017 1021   HDL 54 05/03/2017 1021   CHOLHDL 2.7 05/03/2017 1021   CHOLHDL 3.6 06/29/2015 1416   VLDL 28 06/29/2015 1416   LDLCALC 60 05/03/2017 1021   personally reviewed   Wt Readings from Last 3 Encounters:  05/18/17 (!) 152.9 kg (337 lb)  05/03/17 (!) 156.9 kg (346 lb)  02/22/17 (!) 152.8 kg (336 lb 12.8 oz)      Other studies personally reviewed: Additional studies/ records that were reviewed today include: prior egs, echo 04/16/16, Cecille Rubin Gerhardt's notes , vascular surgery notes Review of the above records today demonstrates: as above   ASSESSMENT AND PLAN:  1.  Ectopic atrial tachycardia I believe that this is reasonably well controlled Asymptomatic Would not be a good candidate for EP procedures. I will reduce cardizem to 30m TID (though I really doubt that this is contributing much to his venous stasis). Increase metoprolol to 550mBID Would not advise AAD therapy at this time A conservative approach is best  2. CAD No ischemic symptoms Increase beta blocker as above  3. Venous stasis/ venous insufficiency Followed by vascular surgery  4. Chronic diastolic dysfunction Stable Sodium restriction and weight loss would be most impactful management  5. HTN Stable No change required today  6. OSA Compliance with CPAP encouraged  Follow-up with LoCecille Rubins scheduled I will see as needed  Current medicines are reviewed at length with the patient today.   The patient does not have concerns regarding his medicines.  The following changes were made today:  none  Labs/ tests ordered today include:  Orders Placed This Encounter  Procedures  . EKG 12-Lead     Signed, JaThompson GrayerMD  05/18/2017 9:31 AM     [facility] [phone](office) [phone])

## 2017-05-19 DIAGNOSIS — E669 Obesity, unspecified: Secondary | ICD-10-CM | POA: Diagnosis not present

## 2017-05-19 DIAGNOSIS — Z85038 Personal history of other malignant neoplasm of large intestine: Secondary | ICD-10-CM | POA: Diagnosis not present

## 2017-05-19 DIAGNOSIS — I2510 Atherosclerotic heart disease of native coronary artery without angina pectoris: Secondary | ICD-10-CM | POA: Diagnosis not present

## 2017-05-19 DIAGNOSIS — I739 Peripheral vascular disease, unspecified: Secondary | ICD-10-CM | POA: Diagnosis not present

## 2017-05-19 DIAGNOSIS — Z433 Encounter for attention to colostomy: Secondary | ICD-10-CM | POA: Diagnosis not present

## 2017-05-19 DIAGNOSIS — Z7982 Long term (current) use of aspirin: Secondary | ICD-10-CM | POA: Diagnosis not present

## 2017-05-19 DIAGNOSIS — I130 Hypertensive heart and chronic kidney disease with heart failure and stage 1 through stage 4 chronic kidney disease, or unspecified chronic kidney disease: Secondary | ICD-10-CM | POA: Diagnosis not present

## 2017-05-19 DIAGNOSIS — Z7902 Long term (current) use of antithrombotics/antiplatelets: Secondary | ICD-10-CM | POA: Diagnosis not present

## 2017-05-19 DIAGNOSIS — Z6841 Body Mass Index (BMI) 40.0 and over, adult: Secondary | ICD-10-CM | POA: Diagnosis not present

## 2017-05-19 DIAGNOSIS — I4891 Unspecified atrial fibrillation: Secondary | ICD-10-CM | POA: Diagnosis not present

## 2017-05-19 DIAGNOSIS — N183 Chronic kidney disease, stage 3 (moderate): Secondary | ICD-10-CM | POA: Diagnosis not present

## 2017-05-19 DIAGNOSIS — Z48815 Encounter for surgical aftercare following surgery on the digestive system: Secondary | ICD-10-CM | POA: Diagnosis not present

## 2017-05-19 DIAGNOSIS — J449 Chronic obstructive pulmonary disease, unspecified: Secondary | ICD-10-CM | POA: Diagnosis not present

## 2017-05-19 DIAGNOSIS — I5042 Chronic combined systolic (congestive) and diastolic (congestive) heart failure: Secondary | ICD-10-CM | POA: Diagnosis not present

## 2017-05-19 DIAGNOSIS — Z48813 Encounter for surgical aftercare following surgery on the respiratory system: Secondary | ICD-10-CM | POA: Diagnosis not present

## 2017-05-19 DIAGNOSIS — Z4800 Encounter for change or removal of nonsurgical wound dressing: Secondary | ICD-10-CM | POA: Diagnosis not present

## 2017-05-22 DIAGNOSIS — I2510 Atherosclerotic heart disease of native coronary artery without angina pectoris: Secondary | ICD-10-CM | POA: Diagnosis not present

## 2017-05-22 DIAGNOSIS — I5042 Chronic combined systolic (congestive) and diastolic (congestive) heart failure: Secondary | ICD-10-CM | POA: Diagnosis not present

## 2017-05-22 DIAGNOSIS — N183 Chronic kidney disease, stage 3 (moderate): Secondary | ICD-10-CM | POA: Diagnosis not present

## 2017-05-22 DIAGNOSIS — J449 Chronic obstructive pulmonary disease, unspecified: Secondary | ICD-10-CM | POA: Diagnosis not present

## 2017-05-22 DIAGNOSIS — I130 Hypertensive heart and chronic kidney disease with heart failure and stage 1 through stage 4 chronic kidney disease, or unspecified chronic kidney disease: Secondary | ICD-10-CM | POA: Diagnosis not present

## 2017-05-22 DIAGNOSIS — Z48815 Encounter for surgical aftercare following surgery on the digestive system: Secondary | ICD-10-CM | POA: Diagnosis not present

## 2017-05-26 DIAGNOSIS — I2510 Atherosclerotic heart disease of native coronary artery without angina pectoris: Secondary | ICD-10-CM | POA: Diagnosis not present

## 2017-05-26 DIAGNOSIS — I130 Hypertensive heart and chronic kidney disease with heart failure and stage 1 through stage 4 chronic kidney disease, or unspecified chronic kidney disease: Secondary | ICD-10-CM | POA: Diagnosis not present

## 2017-05-26 DIAGNOSIS — J449 Chronic obstructive pulmonary disease, unspecified: Secondary | ICD-10-CM | POA: Diagnosis not present

## 2017-05-26 DIAGNOSIS — I5042 Chronic combined systolic (congestive) and diastolic (congestive) heart failure: Secondary | ICD-10-CM | POA: Diagnosis not present

## 2017-05-26 DIAGNOSIS — N183 Chronic kidney disease, stage 3 (moderate): Secondary | ICD-10-CM | POA: Diagnosis not present

## 2017-05-26 DIAGNOSIS — Z48815 Encounter for surgical aftercare following surgery on the digestive system: Secondary | ICD-10-CM | POA: Diagnosis not present

## 2017-05-29 DIAGNOSIS — I130 Hypertensive heart and chronic kidney disease with heart failure and stage 1 through stage 4 chronic kidney disease, or unspecified chronic kidney disease: Secondary | ICD-10-CM | POA: Diagnosis not present

## 2017-05-29 DIAGNOSIS — N183 Chronic kidney disease, stage 3 (moderate): Secondary | ICD-10-CM | POA: Diagnosis not present

## 2017-05-29 DIAGNOSIS — I2510 Atherosclerotic heart disease of native coronary artery without angina pectoris: Secondary | ICD-10-CM | POA: Diagnosis not present

## 2017-05-29 DIAGNOSIS — J449 Chronic obstructive pulmonary disease, unspecified: Secondary | ICD-10-CM | POA: Diagnosis not present

## 2017-05-29 DIAGNOSIS — I5042 Chronic combined systolic (congestive) and diastolic (congestive) heart failure: Secondary | ICD-10-CM | POA: Diagnosis not present

## 2017-05-29 DIAGNOSIS — Z48815 Encounter for surgical aftercare following surgery on the digestive system: Secondary | ICD-10-CM | POA: Diagnosis not present

## 2017-06-02 DIAGNOSIS — I130 Hypertensive heart and chronic kidney disease with heart failure and stage 1 through stage 4 chronic kidney disease, or unspecified chronic kidney disease: Secondary | ICD-10-CM | POA: Diagnosis not present

## 2017-06-02 DIAGNOSIS — I5042 Chronic combined systolic (congestive) and diastolic (congestive) heart failure: Secondary | ICD-10-CM | POA: Diagnosis not present

## 2017-06-02 DIAGNOSIS — J449 Chronic obstructive pulmonary disease, unspecified: Secondary | ICD-10-CM | POA: Diagnosis not present

## 2017-06-02 DIAGNOSIS — I2510 Atherosclerotic heart disease of native coronary artery without angina pectoris: Secondary | ICD-10-CM | POA: Diagnosis not present

## 2017-06-02 DIAGNOSIS — Z48815 Encounter for surgical aftercare following surgery on the digestive system: Secondary | ICD-10-CM | POA: Diagnosis not present

## 2017-06-02 DIAGNOSIS — N183 Chronic kidney disease, stage 3 (moderate): Secondary | ICD-10-CM | POA: Diagnosis not present

## 2017-06-05 DIAGNOSIS — I2510 Atherosclerotic heart disease of native coronary artery without angina pectoris: Secondary | ICD-10-CM | POA: Diagnosis not present

## 2017-06-05 DIAGNOSIS — I130 Hypertensive heart and chronic kidney disease with heart failure and stage 1 through stage 4 chronic kidney disease, or unspecified chronic kidney disease: Secondary | ICD-10-CM | POA: Diagnosis not present

## 2017-06-05 DIAGNOSIS — J449 Chronic obstructive pulmonary disease, unspecified: Secondary | ICD-10-CM | POA: Diagnosis not present

## 2017-06-05 DIAGNOSIS — I5042 Chronic combined systolic (congestive) and diastolic (congestive) heart failure: Secondary | ICD-10-CM | POA: Diagnosis not present

## 2017-06-05 DIAGNOSIS — Z48815 Encounter for surgical aftercare following surgery on the digestive system: Secondary | ICD-10-CM | POA: Diagnosis not present

## 2017-06-05 DIAGNOSIS — N183 Chronic kidney disease, stage 3 (moderate): Secondary | ICD-10-CM | POA: Diagnosis not present

## 2017-06-09 DIAGNOSIS — I2510 Atherosclerotic heart disease of native coronary artery without angina pectoris: Secondary | ICD-10-CM | POA: Diagnosis not present

## 2017-06-09 DIAGNOSIS — Z48815 Encounter for surgical aftercare following surgery on the digestive system: Secondary | ICD-10-CM | POA: Diagnosis not present

## 2017-06-09 DIAGNOSIS — I5042 Chronic combined systolic (congestive) and diastolic (congestive) heart failure: Secondary | ICD-10-CM | POA: Diagnosis not present

## 2017-06-09 DIAGNOSIS — J449 Chronic obstructive pulmonary disease, unspecified: Secondary | ICD-10-CM | POA: Diagnosis not present

## 2017-06-09 DIAGNOSIS — I130 Hypertensive heart and chronic kidney disease with heart failure and stage 1 through stage 4 chronic kidney disease, or unspecified chronic kidney disease: Secondary | ICD-10-CM | POA: Diagnosis not present

## 2017-06-09 DIAGNOSIS — N183 Chronic kidney disease, stage 3 (moderate): Secondary | ICD-10-CM | POA: Diagnosis not present

## 2017-06-12 DIAGNOSIS — I5042 Chronic combined systolic (congestive) and diastolic (congestive) heart failure: Secondary | ICD-10-CM | POA: Diagnosis not present

## 2017-06-12 DIAGNOSIS — Z48815 Encounter for surgical aftercare following surgery on the digestive system: Secondary | ICD-10-CM | POA: Diagnosis not present

## 2017-06-12 DIAGNOSIS — I130 Hypertensive heart and chronic kidney disease with heart failure and stage 1 through stage 4 chronic kidney disease, or unspecified chronic kidney disease: Secondary | ICD-10-CM | POA: Diagnosis not present

## 2017-06-12 DIAGNOSIS — I2510 Atherosclerotic heart disease of native coronary artery without angina pectoris: Secondary | ICD-10-CM | POA: Diagnosis not present

## 2017-06-12 DIAGNOSIS — N183 Chronic kidney disease, stage 3 (moderate): Secondary | ICD-10-CM | POA: Diagnosis not present

## 2017-06-12 DIAGNOSIS — J449 Chronic obstructive pulmonary disease, unspecified: Secondary | ICD-10-CM | POA: Diagnosis not present

## 2017-06-16 DIAGNOSIS — Z48815 Encounter for surgical aftercare following surgery on the digestive system: Secondary | ICD-10-CM | POA: Diagnosis not present

## 2017-06-16 DIAGNOSIS — I130 Hypertensive heart and chronic kidney disease with heart failure and stage 1 through stage 4 chronic kidney disease, or unspecified chronic kidney disease: Secondary | ICD-10-CM | POA: Diagnosis not present

## 2017-06-16 DIAGNOSIS — I5042 Chronic combined systolic (congestive) and diastolic (congestive) heart failure: Secondary | ICD-10-CM | POA: Diagnosis not present

## 2017-06-16 DIAGNOSIS — N183 Chronic kidney disease, stage 3 (moderate): Secondary | ICD-10-CM | POA: Diagnosis not present

## 2017-06-16 DIAGNOSIS — I2510 Atherosclerotic heart disease of native coronary artery without angina pectoris: Secondary | ICD-10-CM | POA: Diagnosis not present

## 2017-06-16 DIAGNOSIS — J449 Chronic obstructive pulmonary disease, unspecified: Secondary | ICD-10-CM | POA: Diagnosis not present

## 2017-06-19 DIAGNOSIS — N183 Chronic kidney disease, stage 3 (moderate): Secondary | ICD-10-CM | POA: Diagnosis not present

## 2017-06-19 DIAGNOSIS — J449 Chronic obstructive pulmonary disease, unspecified: Secondary | ICD-10-CM | POA: Diagnosis not present

## 2017-06-19 DIAGNOSIS — I5042 Chronic combined systolic (congestive) and diastolic (congestive) heart failure: Secondary | ICD-10-CM | POA: Diagnosis not present

## 2017-06-19 DIAGNOSIS — I2510 Atherosclerotic heart disease of native coronary artery without angina pectoris: Secondary | ICD-10-CM | POA: Diagnosis not present

## 2017-06-19 DIAGNOSIS — I130 Hypertensive heart and chronic kidney disease with heart failure and stage 1 through stage 4 chronic kidney disease, or unspecified chronic kidney disease: Secondary | ICD-10-CM | POA: Diagnosis not present

## 2017-06-19 DIAGNOSIS — Z48815 Encounter for surgical aftercare following surgery on the digestive system: Secondary | ICD-10-CM | POA: Diagnosis not present

## 2017-06-23 DIAGNOSIS — I130 Hypertensive heart and chronic kidney disease with heart failure and stage 1 through stage 4 chronic kidney disease, or unspecified chronic kidney disease: Secondary | ICD-10-CM | POA: Diagnosis not present

## 2017-06-23 DIAGNOSIS — I2510 Atherosclerotic heart disease of native coronary artery without angina pectoris: Secondary | ICD-10-CM | POA: Diagnosis not present

## 2017-06-23 DIAGNOSIS — I5042 Chronic combined systolic (congestive) and diastolic (congestive) heart failure: Secondary | ICD-10-CM | POA: Diagnosis not present

## 2017-06-23 DIAGNOSIS — J449 Chronic obstructive pulmonary disease, unspecified: Secondary | ICD-10-CM | POA: Diagnosis not present

## 2017-06-23 DIAGNOSIS — Z48815 Encounter for surgical aftercare following surgery on the digestive system: Secondary | ICD-10-CM | POA: Diagnosis not present

## 2017-06-23 DIAGNOSIS — N183 Chronic kidney disease, stage 3 (moderate): Secondary | ICD-10-CM | POA: Diagnosis not present

## 2017-06-26 DIAGNOSIS — I130 Hypertensive heart and chronic kidney disease with heart failure and stage 1 through stage 4 chronic kidney disease, or unspecified chronic kidney disease: Secondary | ICD-10-CM | POA: Diagnosis not present

## 2017-06-26 DIAGNOSIS — I5042 Chronic combined systolic (congestive) and diastolic (congestive) heart failure: Secondary | ICD-10-CM | POA: Diagnosis not present

## 2017-06-26 DIAGNOSIS — Z48815 Encounter for surgical aftercare following surgery on the digestive system: Secondary | ICD-10-CM | POA: Diagnosis not present

## 2017-06-26 DIAGNOSIS — I2510 Atherosclerotic heart disease of native coronary artery without angina pectoris: Secondary | ICD-10-CM | POA: Diagnosis not present

## 2017-06-26 DIAGNOSIS — N183 Chronic kidney disease, stage 3 (moderate): Secondary | ICD-10-CM | POA: Diagnosis not present

## 2017-06-26 DIAGNOSIS — J449 Chronic obstructive pulmonary disease, unspecified: Secondary | ICD-10-CM | POA: Diagnosis not present

## 2017-06-30 ENCOUNTER — Other Ambulatory Visit: Payer: Self-pay

## 2017-06-30 ENCOUNTER — Inpatient Hospital Stay (HOSPITAL_COMMUNITY)
Admission: EM | Admit: 2017-06-30 | Discharge: 2017-07-04 | DRG: 854 | Disposition: A | Discharge status: Home Health | Payer: Medicare Other | Attending: Internal Medicine | Admitting: Internal Medicine

## 2017-06-30 ENCOUNTER — Inpatient Hospital Stay (HOSPITAL_COMMUNITY): Payer: Medicare Other | Admitting: Certified Registered Nurse Anesthetist

## 2017-06-30 ENCOUNTER — Inpatient Hospital Stay (HOSPITAL_COMMUNITY): Payer: Medicare Other

## 2017-06-30 ENCOUNTER — Emergency Department (HOSPITAL_COMMUNITY): Payer: Medicare Other

## 2017-06-30 ENCOUNTER — Telehealth: Payer: Self-pay | Admitting: Internal Medicine

## 2017-06-30 ENCOUNTER — Encounter (HOSPITAL_COMMUNITY)
Admission: EM | Disposition: A | Discharge status: Home Health | Payer: Self-pay | Source: Home / Self Care | Attending: Internal Medicine

## 2017-06-30 ENCOUNTER — Encounter (HOSPITAL_COMMUNITY): Payer: Self-pay | Admitting: Emergency Medicine

## 2017-06-30 DIAGNOSIS — N1370 Vesicoureteral-reflux, unspecified: Secondary | ICD-10-CM | POA: Diagnosis present

## 2017-06-30 DIAGNOSIS — Z48815 Encounter for surgical aftercare following surgery on the digestive system: Secondary | ICD-10-CM | POA: Diagnosis not present

## 2017-06-30 DIAGNOSIS — R319 Hematuria, unspecified: Secondary | ICD-10-CM

## 2017-06-30 DIAGNOSIS — N189 Chronic kidney disease, unspecified: Secondary | ICD-10-CM

## 2017-06-30 DIAGNOSIS — Z806 Family history of leukemia: Secondary | ICD-10-CM

## 2017-06-30 DIAGNOSIS — E872 Acidosis: Secondary | ICD-10-CM | POA: Diagnosis present

## 2017-06-30 DIAGNOSIS — Z833 Family history of diabetes mellitus: Secondary | ICD-10-CM | POA: Diagnosis not present

## 2017-06-30 DIAGNOSIS — N210 Calculus in bladder: Secondary | ICD-10-CM | POA: Diagnosis not present

## 2017-06-30 DIAGNOSIS — Z9989 Dependence on other enabling machines and devices: Secondary | ICD-10-CM | POA: Diagnosis not present

## 2017-06-30 DIAGNOSIS — Z6841 Body Mass Index (BMI) 40.0 and over, adult: Secondary | ICD-10-CM | POA: Diagnosis not present

## 2017-06-30 DIAGNOSIS — A419 Sepsis, unspecified organism: Secondary | ICD-10-CM

## 2017-06-30 DIAGNOSIS — Z955 Presence of coronary angioplasty implant and graft: Secondary | ICD-10-CM

## 2017-06-30 DIAGNOSIS — D696 Thrombocytopenia, unspecified: Secondary | ICD-10-CM | POA: Diagnosis present

## 2017-06-30 DIAGNOSIS — I712 Thoracic aortic aneurysm, without rupture: Secondary | ICD-10-CM | POA: Diagnosis present

## 2017-06-30 DIAGNOSIS — I4719 Other supraventricular tachycardia: Secondary | ICD-10-CM

## 2017-06-30 DIAGNOSIS — Z85038 Personal history of other malignant neoplasm of large intestine: Secondary | ICD-10-CM

## 2017-06-30 DIAGNOSIS — Z79899 Other long term (current) drug therapy: Secondary | ICD-10-CM

## 2017-06-30 DIAGNOSIS — I255 Ischemic cardiomyopathy: Secondary | ICD-10-CM | POA: Diagnosis present

## 2017-06-30 DIAGNOSIS — Z23 Encounter for immunization: Secondary | ICD-10-CM | POA: Diagnosis not present

## 2017-06-30 DIAGNOSIS — N184 Chronic kidney disease, stage 4 (severe): Secondary | ICD-10-CM | POA: Diagnosis present

## 2017-06-30 DIAGNOSIS — M1990 Unspecified osteoarthritis, unspecified site: Secondary | ICD-10-CM | POA: Diagnosis present

## 2017-06-30 DIAGNOSIS — Z7982 Long term (current) use of aspirin: Secondary | ICD-10-CM

## 2017-06-30 DIAGNOSIS — I5041 Acute combined systolic (congestive) and diastolic (congestive) heart failure: Secondary | ICD-10-CM | POA: Diagnosis not present

## 2017-06-30 DIAGNOSIS — N201 Calculus of ureter: Secondary | ICD-10-CM | POA: Diagnosis not present

## 2017-06-30 DIAGNOSIS — N323 Diverticulum of bladder: Secondary | ICD-10-CM | POA: Diagnosis present

## 2017-06-30 DIAGNOSIS — Z9889 Other specified postprocedural states: Secondary | ICD-10-CM | POA: Diagnosis not present

## 2017-06-30 DIAGNOSIS — R531 Weakness: Secondary | ICD-10-CM

## 2017-06-30 DIAGNOSIS — R0602 Shortness of breath: Secondary | ICD-10-CM

## 2017-06-30 DIAGNOSIS — N1330 Unspecified hydronephrosis: Secondary | ICD-10-CM

## 2017-06-30 DIAGNOSIS — A4152 Sepsis due to Pseudomonas: Principal | ICD-10-CM | POA: Diagnosis present

## 2017-06-30 DIAGNOSIS — I5042 Chronic combined systolic (congestive) and diastolic (congestive) heart failure: Secondary | ICD-10-CM | POA: Diagnosis not present

## 2017-06-30 DIAGNOSIS — I471 Supraventricular tachycardia: Secondary | ICD-10-CM | POA: Diagnosis not present

## 2017-06-30 DIAGNOSIS — Z87891 Personal history of nicotine dependence: Secondary | ICD-10-CM

## 2017-06-30 DIAGNOSIS — N179 Acute kidney failure, unspecified: Secondary | ICD-10-CM | POA: Diagnosis not present

## 2017-06-30 DIAGNOSIS — N202 Calculus of kidney with calculus of ureter: Secondary | ICD-10-CM | POA: Diagnosis not present

## 2017-06-30 DIAGNOSIS — Z419 Encounter for procedure for purposes other than remedying health state, unspecified: Secondary | ICD-10-CM

## 2017-06-30 DIAGNOSIS — I472 Ventricular tachycardia: Secondary | ICD-10-CM | POA: Diagnosis not present

## 2017-06-30 DIAGNOSIS — G4733 Obstructive sleep apnea (adult) (pediatric): Secondary | ICD-10-CM | POA: Diagnosis present

## 2017-06-30 DIAGNOSIS — E7800 Pure hypercholesterolemia, unspecified: Secondary | ICD-10-CM | POA: Diagnosis present

## 2017-06-30 DIAGNOSIS — I130 Hypertensive heart and chronic kidney disease with heart failure and stage 1 through stage 4 chronic kidney disease, or unspecified chronic kidney disease: Secondary | ICD-10-CM | POA: Diagnosis not present

## 2017-06-30 DIAGNOSIS — I5022 Chronic systolic (congestive) heart failure: Secondary | ICD-10-CM

## 2017-06-30 DIAGNOSIS — N136 Pyonephrosis: Secondary | ICD-10-CM | POA: Diagnosis not present

## 2017-06-30 DIAGNOSIS — I4510 Unspecified right bundle-branch block: Secondary | ICD-10-CM | POA: Diagnosis present

## 2017-06-30 DIAGNOSIS — N390 Urinary tract infection, site not specified: Secondary | ICD-10-CM | POA: Diagnosis not present

## 2017-06-30 DIAGNOSIS — I10 Essential (primary) hypertension: Secondary | ICD-10-CM | POA: Diagnosis not present

## 2017-06-30 DIAGNOSIS — Z8249 Family history of ischemic heart disease and other diseases of the circulatory system: Secondary | ICD-10-CM | POA: Diagnosis not present

## 2017-06-30 DIAGNOSIS — Z800 Family history of malignant neoplasm of digestive organs: Secondary | ICD-10-CM

## 2017-06-30 DIAGNOSIS — I259 Chronic ischemic heart disease, unspecified: Secondary | ICD-10-CM

## 2017-06-30 DIAGNOSIS — N183 Chronic kidney disease, stage 3 (moderate): Secondary | ICD-10-CM | POA: Diagnosis not present

## 2017-06-30 DIAGNOSIS — I878 Other specified disorders of veins: Secondary | ICD-10-CM | POA: Insufficient documentation

## 2017-06-30 DIAGNOSIS — J449 Chronic obstructive pulmonary disease, unspecified: Secondary | ICD-10-CM | POA: Diagnosis present

## 2017-06-30 DIAGNOSIS — I2510 Atherosclerotic heart disease of native coronary artery without angina pectoris: Secondary | ICD-10-CM | POA: Diagnosis present

## 2017-06-30 DIAGNOSIS — Z8041 Family history of malignant neoplasm of ovary: Secondary | ICD-10-CM

## 2017-06-30 DIAGNOSIS — Z820 Family history of epilepsy and other diseases of the nervous system: Secondary | ICD-10-CM

## 2017-06-30 DIAGNOSIS — Z7902 Long term (current) use of antithrombotics/antiplatelets: Secondary | ICD-10-CM

## 2017-06-30 DIAGNOSIS — Z8042 Family history of malignant neoplasm of prostate: Secondary | ICD-10-CM

## 2017-06-30 DIAGNOSIS — R079 Chest pain, unspecified: Secondary | ICD-10-CM | POA: Diagnosis not present

## 2017-06-30 DIAGNOSIS — N132 Hydronephrosis with renal and ureteral calculous obstruction: Secondary | ICD-10-CM | POA: Diagnosis not present

## 2017-06-30 HISTORY — PX: CYSTOSCOPY W/ URETERAL STENT PLACEMENT: SHX1429

## 2017-06-30 LAB — BASIC METABOLIC PANEL
Anion gap: 13 (ref 5–15)
BUN: 35 mg/dL — ABNORMAL HIGH (ref 6–20)
CO2: 19 mmol/L — ABNORMAL LOW (ref 22–32)
Calcium: 9.6 mg/dL (ref 8.9–10.3)
Chloride: 107 mmol/L (ref 101–111)
Creatinine, Ser: 3.6 mg/dL — ABNORMAL HIGH (ref 0.61–1.24)
GFR calc Af Amer: 19 mL/min — ABNORMAL LOW (ref >60)
GFR calc non Af Amer: 16 mL/min — ABNORMAL LOW (ref >60)
Glucose, Bld: 108 mg/dL — ABNORMAL HIGH (ref 65–99)
Potassium: 3.8 mmol/L (ref 3.5–5.1)
Sodium: 139 mmol/L (ref 135–145)

## 2017-06-30 LAB — CBC
HCT: 38.2 % — ABNORMAL LOW (ref 39.0–52.0)
Hemoglobin: 12.5 g/dL — ABNORMAL LOW (ref 13.0–17.0)
MCH: 29.2 pg (ref 26.0–34.0)
MCHC: 32.7 g/dL (ref 30.0–36.0)
MCV: 89.3 fL (ref 78.0–100.0)
Platelets: 140 10*3/uL — ABNORMAL LOW (ref 150–400)
RBC: 4.28 MIL/uL (ref 4.22–5.81)
RDW: 14.6 % (ref 11.5–15.5)
WBC: 16.3 10*3/uL — ABNORMAL HIGH (ref 4.0–10.5)

## 2017-06-30 LAB — PROCALCITONIN: PROCALCITONIN: 21.99 ng/mL

## 2017-06-30 LAB — LACTIC ACID, PLASMA
Lactic Acid, Venous: 1.6 mmol/L (ref 0.5–1.9)
Lactic Acid, Venous: 1.5 mmol/L (ref 0.5–1.9)

## 2017-06-30 LAB — URINALYSIS, ROUTINE W REFLEX MICROSCOPIC
Bilirubin Urine: NEGATIVE
Glucose, UA: NEGATIVE mg/dL
Ketones, ur: NEGATIVE mg/dL
Nitrite: NEGATIVE
Protein, ur: 100 mg/dL — AB
Specific Gravity, Urine: 1.014 (ref 1.005–1.030)
pH: 5 (ref 5.0–8.0)

## 2017-06-30 LAB — I-STAT CG4 LACTIC ACID, ED
Lactic Acid, Venous: 2.25 mmol/L (ref 0.5–1.9)
Lactic Acid, Venous: 1.54 mmol/L (ref 0.5–1.9)

## 2017-06-30 LAB — CBG MONITORING, ED: Glucose-Capillary: 98 mg/dL (ref 65–99)

## 2017-06-30 LAB — APTT: aPTT: 37 s — ABNORMAL HIGH (ref 24–36)

## 2017-06-30 LAB — PROTIME-INR
INR: 1.31
Prothrombin Time: 16.2 seconds — ABNORMAL HIGH (ref 11.4–15.2)

## 2017-06-30 SURGERY — CYSTOSCOPY, WITH RETROGRADE PYELOGRAM AND URETERAL STENT INSERTION
Anesthesia: General | Site: Bladder | Laterality: Left

## 2017-06-30 MED ORDER — METOPROLOL TARTRATE 25 MG PO TABS
37.5000 mg | ORAL_TABLET | Freq: Two times a day (BID) | ORAL | Status: DC
  Administered 2017-06-30 – 2017-07-04 (×8): 37.5 mg via ORAL
  Filled 2017-06-30 (×10): qty 2

## 2017-06-30 MED ORDER — LIDOCAINE 2% (20 MG/ML) 5 ML SYRINGE
INTRAMUSCULAR | Status: DC | PRN
  Administered 2017-06-30: 100 mg via INTRAVENOUS

## 2017-06-30 MED ORDER — MIDAZOLAM HCL 2 MG/2ML IJ SOLN
INTRAMUSCULAR | Status: AC
  Filled 2017-06-30: qty 2

## 2017-06-30 MED ORDER — ONDANSETRON HCL 4 MG/2ML IJ SOLN: 4.0000 mg | Freq: Four times a day (QID) | INTRAMUSCULAR | Status: DC | PRN

## 2017-06-30 MED ORDER — LEVALBUTEROL HCL 0.63 MG/3ML IN NEBU: 0.6300 mg | INHALATION_SOLUTION | Freq: Four times a day (QID) | RESPIRATORY_TRACT | Status: DC | PRN

## 2017-06-30 MED ORDER — SUCCINYLCHOLINE CHLORIDE 200 MG/10ML IV SOSY
PREFILLED_SYRINGE | INTRAVENOUS | Status: AC
  Filled 2017-06-30: qty 30

## 2017-06-30 MED ORDER — CEFTRIAXONE SODIUM 2 G IJ SOLR
2.0000 g | INTRAMUSCULAR | Status: DC
  Filled 2017-06-30: qty 20

## 2017-06-30 MED ORDER — METOPROLOL TARTRATE 5 MG/5ML IV SOLN
5.0000 mg | INTRAVENOUS | Status: DC | PRN
  Administered 2017-06-30: 2 mg via INTRAVENOUS
  Administered 2017-06-30 (×3): 1 mg via INTRAVENOUS

## 2017-06-30 MED ORDER — SODIUM CHLORIDE 0.9 % IV SOLN: INTRAVENOUS | Status: DC

## 2017-06-30 MED ORDER — LIDOCAINE HCL URETHRAL/MUCOSAL 2 % EX GEL
CUTANEOUS | Status: AC
  Filled 2017-06-30: qty 20

## 2017-06-30 MED ORDER — SODIUM CHLORIDE 0.9 % IV BOLUS
1000.0000 mL | Freq: Once | INTRAVENOUS | Status: AC
Start: 2017-06-30 — End: 2017-06-30
  Administered 2017-06-30: 1000 mL via INTRAVENOUS

## 2017-06-30 MED ORDER — SODIUM CHLORIDE 0.9 % IV SOLN
INTRAVENOUS | Status: DC
  Administered 2017-06-30 – 2017-07-03 (×2): via INTRAVENOUS

## 2017-06-30 MED ORDER — ACETAMINOPHEN 500 MG PO TABS
1000.0000 mg | ORAL_TABLET | Freq: Once | ORAL | Status: AC
  Administered 2017-06-30: 1000 mg via ORAL
  Filled 2017-06-30: qty 2

## 2017-06-30 MED ORDER — SODIUM CHLORIDE 0.9 % IV BOLUS
1000.0000 mL | Freq: Once | INTRAVENOUS | Status: AC
  Administered 2017-06-30: 1000 mL via INTRAVENOUS

## 2017-06-30 MED ORDER — SODIUM CHLORIDE 0.9 % IV SOLN
INTRAVENOUS | Status: DC
  Administered 2017-06-30 – 2017-07-03 (×6): via INTRAVENOUS

## 2017-06-30 MED ORDER — FERROUS SULFATE 325 (65 FE) MG PO TABS
325.0000 mg | ORAL_TABLET | Freq: Every day | ORAL | Status: DC
  Administered 2017-07-01 – 2017-07-04 (×4): 325 mg via ORAL
  Filled 2017-06-30 (×4): qty 1

## 2017-06-30 MED ORDER — STERILE WATER FOR IRRIGATION IR SOLN
Status: DC | PRN
  Administered 2017-06-30 (×2): 3000 mL

## 2017-06-30 MED ORDER — SODIUM CHLORIDE 0.9 % IV SOLN
2.0000 g | Freq: Once | INTRAVENOUS | Status: AC
  Administered 2017-06-30: 2 g via INTRAVENOUS
  Filled 2017-06-30: qty 20

## 2017-06-30 MED ORDER — ONDANSETRON HCL 4 MG/2ML IJ SOLN
INTRAMUSCULAR | Status: AC
  Filled 2017-06-30: qty 4

## 2017-06-30 MED ORDER — SODIUM CHLORIDE 0.9% FLUSH
3.0000 mL | Freq: Two times a day (BID) | INTRAVENOUS | Status: DC
  Administered 2017-06-30 – 2017-07-04 (×6): 3 mL via INTRAVENOUS

## 2017-06-30 MED ORDER — ROCURONIUM BROMIDE 50 MG/5ML IV SOLN
INTRAVENOUS | Status: AC
  Filled 2017-06-30: qty 1

## 2017-06-30 MED ORDER — MIDAZOLAM HCL 2 MG/2ML IJ SOLN
INTRAMUSCULAR | Status: DC | PRN
  Administered 2017-06-30: 1 mg via INTRAVENOUS

## 2017-06-30 MED ORDER — PHENYLEPHRINE 40 MCG/ML (10ML) SYRINGE FOR IV PUSH (FOR BLOOD PRESSURE SUPPORT)
PREFILLED_SYRINGE | INTRAVENOUS | Status: AC
  Filled 2017-06-30: qty 10

## 2017-06-30 MED ORDER — ATORVASTATIN CALCIUM 40 MG PO TABS
40.0000 mg | ORAL_TABLET | Freq: Every day | ORAL | Status: DC
  Administered 2017-06-30 – 2017-07-03 (×4): 40 mg via ORAL
  Filled 2017-06-30 (×4): qty 1

## 2017-06-30 MED ORDER — DEXAMETHASONE SODIUM PHOSPHATE 10 MG/ML IJ SOLN
INTRAMUSCULAR | Status: DC | PRN
  Administered 2017-06-30: 10 mg via INTRAVENOUS

## 2017-06-30 MED ORDER — METOPROLOL TARTRATE 5 MG/5ML IV SOLN
INTRAVENOUS | Status: AC
  Filled 2017-06-30: qty 5

## 2017-06-30 MED ORDER — ACETAMINOPHEN 325 MG PO TABS: 650.0000 mg | ORAL_TABLET | Freq: Four times a day (QID) | ORAL | Status: DC | PRN

## 2017-06-30 MED ORDER — SUCCINYLCHOLINE 20MG/ML (10ML) SYRINGE FOR MEDFUSION PUMP - OPTIME
INTRAMUSCULAR | Status: DC | PRN
  Administered 2017-06-30: 130 mg via INTRAVENOUS

## 2017-06-30 MED ORDER — ALBUTEROL SULFATE HFA 108 (90 BASE) MCG/ACT IN AERS
INHALATION_SPRAY | RESPIRATORY_TRACT | Status: DC | PRN
  Administered 2017-06-30 (×2): 2 via RESPIRATORY_TRACT

## 2017-06-30 MED ORDER — PROPOFOL 10 MG/ML IV BOLUS
INTRAVENOUS | Status: AC
  Filled 2017-06-30: qty 20

## 2017-06-30 MED ORDER — IOPAMIDOL (ISOVUE-300) INJECTION 61%
INTRAVENOUS | Status: AC
  Filled 2017-06-30: qty 50

## 2017-06-30 MED ORDER — ACETAMINOPHEN 650 MG RE SUPP: 650.0000 mg | Freq: Four times a day (QID) | RECTAL | Status: DC | PRN

## 2017-06-30 MED ORDER — FENTANYL CITRATE (PF) 250 MCG/5ML IJ SOLN
INTRAMUSCULAR | Status: AC
  Filled 2017-06-30: qty 5

## 2017-06-30 MED ORDER — ENOXAPARIN SODIUM 40 MG/0.4ML ~~LOC~~ SOLN
40.0000 mg | SUBCUTANEOUS | Status: DC
  Administered 2017-07-01: 40 mg via SUBCUTANEOUS
  Filled 2017-06-30: qty 0.4

## 2017-06-30 MED ORDER — DEXAMETHASONE SODIUM PHOSPHATE 10 MG/ML IJ SOLN
INTRAMUSCULAR | Status: AC
  Filled 2017-06-30: qty 2

## 2017-06-30 MED ORDER — CLOPIDOGREL BISULFATE 75 MG PO TABS
75.0000 mg | ORAL_TABLET | Freq: Every day | ORAL | Status: DC
  Administered 2017-07-01 – 2017-07-04 (×4): 75 mg via ORAL
  Filled 2017-06-30 (×4): qty 1

## 2017-06-30 MED ORDER — ONDANSETRON HCL 4 MG PO TABS: 4.0000 mg | ORAL_TABLET | Freq: Four times a day (QID) | ORAL | Status: DC | PRN

## 2017-06-30 MED ORDER — PHENYLEPHRINE HCL 10 MG/ML IJ SOLN
INTRAMUSCULAR | Status: DC | PRN
  Administered 2017-06-30: 40 ug via INTRAVENOUS
  Administered 2017-06-30: 80 ug via INTRAVENOUS

## 2017-06-30 MED ORDER — FENTANYL CITRATE (PF) 100 MCG/2ML IJ SOLN: 25.0000 ug | INTRAMUSCULAR | Status: DC | PRN

## 2017-06-30 MED ORDER — LIDOCAINE 2% (20 MG/ML) 5 ML SYRINGE
INTRAMUSCULAR | Status: AC
  Filled 2017-06-30: qty 20

## 2017-06-30 MED ORDER — SUGAMMADEX SODIUM 200 MG/2ML IV SOLN
INTRAVENOUS | Status: AC
  Filled 2017-06-30: qty 2

## 2017-06-30 MED ORDER — PROPOFOL 10 MG/ML IV BOLUS
INTRAVENOUS | Status: DC | PRN
  Administered 2017-06-30: 30 mg via INTRAVENOUS
  Administered 2017-06-30: 120 mg via INTRAVENOUS

## 2017-06-30 MED ORDER — ASPIRIN EC 81 MG PO TBEC
81.0000 mg | DELAYED_RELEASE_TABLET | Freq: Every day | ORAL | Status: DC
  Administered 2017-07-01 – 2017-07-04 (×4): 81 mg via ORAL
  Filled 2017-06-30 (×4): qty 1

## 2017-06-30 MED ORDER — IOPAMIDOL (ISOVUE-300) INJECTION 61%
INTRAVENOUS | Status: DC | PRN
  Administered 2017-06-30: 10 mL

## 2017-06-30 MED ORDER — FENTANYL CITRATE (PF) 250 MCG/5ML IJ SOLN
INTRAMUSCULAR | Status: DC | PRN
  Administered 2017-06-30: 50 ug via INTRAVENOUS
  Administered 2017-06-30: 100 ug via INTRAVENOUS
  Administered 2017-06-30: 50 ug via INTRAVENOUS

## 2017-06-30 SURGICAL SUPPLY — 30 items
ADAPTER CATH URET PLST 4-6FR (CATHETERS) IMPLANT
BAG URINE DRAINAGE (UROLOGICAL SUPPLIES) ×3 IMPLANT
BAG URO CATCHER STRL LF (MISCELLANEOUS) ×3 IMPLANT
BENZOIN TINCTURE PRP APPL 2/3 (GAUZE/BANDAGES/DRESSINGS) IMPLANT
BLADE 10 SAFETY STRL DISP (BLADE) ×3 IMPLANT
BUCKET BIOHAZARD WASTE 5 GAL (MISCELLANEOUS) ×3 IMPLANT
CATH FOLEY 2WAY SLVR  5CC 16FR (CATHETERS) ×2
CATH FOLEY 2WAY SLVR 5CC 16FR (CATHETERS) ×1 IMPLANT
CATH INTERMIT  6FR 70CM (CATHETERS) ×3 IMPLANT
CATH URET 5FR 28IN CONE TIP (BALLOONS)
CATH URET 5FR 70CM CONE TIP (BALLOONS) IMPLANT
DRAPE CAMERA CLOSED 9X96 (DRAPES) ×6 IMPLANT
GOWN STRL REUS W/ TWL LRG LVL3 (GOWN DISPOSABLE) ×1 IMPLANT
GOWN STRL REUS W/ TWL XL LVL3 (GOWN DISPOSABLE) ×1 IMPLANT
GOWN STRL REUS W/TWL LRG LVL3 (GOWN DISPOSABLE) ×2
GOWN STRL REUS W/TWL XL LVL3 (GOWN DISPOSABLE) ×2
GUIDEWIRE COOK  .035 (WIRE) IMPLANT
GUIDEWIRE SEXTANT (WIRE) ×3 IMPLANT
KIT TURNOVER KIT B (KITS) ×3 IMPLANT
NS IRRIG 1000ML POUR BTL (IV SOLUTION) ×6 IMPLANT
PACK CYSTO (CUSTOM PROCEDURE TRAY) ×3 IMPLANT
PAD ARMBOARD 7.5X6 YLW CONV (MISCELLANEOUS) ×6 IMPLANT
PLUG CATH AND CAP STER (CATHETERS) IMPLANT
STENT URET 6FRX24 CONTOUR (STENTS) IMPLANT
STENT URET 6FRX26 CONTOUR (STENTS) ×3 IMPLANT
SYRINGE CONTROL L 12CC (SYRINGE) ×3 IMPLANT
SYRINGE TOOMEY DISP (SYRINGE) IMPLANT
UNDERPAD 30X30 (UNDERPADS AND DIAPERS) ×3 IMPLANT
WATER STERILE IRR 1000ML POUR (IV SOLUTION) ×3 IMPLANT
WIRE COONS/BENSON .038X145CM (WIRE) IMPLANT

## 2017-06-30 NOTE — ED Triage Notes (Signed)
Patient suggested by home health RN to go to ED d/t fast HR this morning. Patient's wife states the nurse found patient to have dizziness upon standing this morning as well. Patient currently being seen for wound on abdomen s/p reversal colostomy that didn't go well. Wound dressing changed this morning - no signs of redness or drainage, wife states she was told it was healing well after about a year. Patient denies dizziness at this time, denies SOB, no pain. Resp e/u, skin warm/dry.

## 2017-06-30 NOTE — ED Provider Notes (Signed)
[facility] EMERGENCY DEPARTMENT Provider Note   CSN: [id number] Arrival date & time: 06/30/17  1041     History   Chief Complaint Chief Complaint  Patient presents with  . Tachycardia  . Dizziness    HPI Larry [name] is a 67 y.o. male.  HPI   67 year old male with generalized weakness and fatigue for the past couple days and subjective fever for the past day or so.  Patient has been deconditined and had a slow recovery since reversal of colostomy and subsequently anastomotic leak with creation of an ileostomy this past February.  His home nurse came to change the bandages on his chronic abdominal wound and noted that he seemed to be much more fatigued than he normally is.  He had a resting heart rate approximately 100 210.  He felt warm to the touch.  She advised that she come to the emergency room for further evaluation.  He denies any acute pain.  No urinary complaints.  No acute respiratory complaints.  Past Medical History:  Diagnosis Date  . Arthritis   . Atrial tachycardia ([place])    managed on beta blocker therapy  . Childhood asthma    "went away after I was 16"  . Chronic combined systolic and diastolic CHF, NYHA class 3 (HCC)    has diastolic heart failure grade 1; EF is 45 to 50% per echo 05/2011; EF 41% by Myoview 2016  . CKD (chronic kidney disease) stage 3, GFR 30-59 ml/min (HCC)   . Colon cancer ([place]) 2015   MSI high; IHC loss of MLH1 and PMS2; BRAF negative; Negative methylation  . COPD (chronic obstructive pulmonary disease) ([place])   . Coronary artery disease   . Family history of ovarian cancer   . Hypercholesteremia   . Hypertension   . Noncompliance   . NSVT (nonsustained ventricular tachycardia) (HCC)    beta blocker restarted  . Obesity   . OSA on CPAP    used nightly, pt does not know settings  . Peripheral arterial disease (HCC)    4.2 cm thoracic aortic aneurysm per chest ct 11-14-15 epic  . S/P colostomy ([place])    2014    . Thrombocytopenia ([place])   . Torsades de pointes ([place])    X 2 episodes during hospital visit 12'14"electrolyte imbalance"- "Shocked"    Patient Active Problem List   Diagnosis Date Noted  . Genetic testing 08/08/2016  . Acute pulmonary edema (HCC)   . Acute respiratory failure with hypoxia ([place])   . SBO (small bowel obstruction) ([name])   . Encounter for nasogastric tube placement   . Abdominal pain   . Hypervolemia   . Pressure injury of skin 04/25/2016  . Acute respiratory failure ([place])   . Adynamic ileus ([place])   . Peritonitis ([place])   . AKI (acute kidney injury) ([place]) 04/19/2016  . Colostomy in place [facility]) 04/12/2016  . Pulmonary infiltrate present on computed tomography 11/15/2015  . Cough 11/14/2015  . Lung nodule   . Atrial tachycardia ([place])   . Chronic combined systolic and diastolic CHF, NYHA class 3 ([place])   . Palpitations   . Dyspnea and respiratory abnormalities   . Family history of ovarian cancer   . CKD (chronic kidney disease) stage 3, GFR 30-59 ml/min (HCC) 06/12/2014  . Thrombocytopenia ([place])   . Ischemic chest pain 06/11/2014  . Atherosclerosis of artery of extremity with ulceration ([place]) 06/06/2014  . Venous stasis ulcer of left lower extremity ([place]) 06/06/2014  .  Acute combined systolic and diastolic congestive heart failure (HCC) 10/03/2013  . Acute respiratory distress 09/30/2013  . Respiratory distress 09/30/2013  . Peripheral arterial disease (HCC) 08/21/2013  . Postop check 03/26/2013  . Physical deconditioning 03/01/2013  . Torsades de pointes (HCC) 02/27/2013  . Acute on chronic diastolic heart failure (HCC) 02/15/2013  . Colon cancer (HCC) 02/09/2013  . Atrial fibrillation (HCC) 02/05/2013  . Acute renal failure (HCC) 02/02/2013  . Hypotension 02/02/2013  . Chronic blood loss anemia 02/02/2013  . Edema 06/16/2011  . Tachycardia 06/16/2011  . IMPOTENCE OF ORGANIC ORIGIN 01/12/2009  . Obstructive sleep apnea 12/04/2008  . HYPERLIPIDEMIA  12/03/2008  . Obesity 12/03/2008  . Coronary atherosclerosis 12/03/2008  . CARDIOMYOPATHY, ISCHEMIC 12/03/2008  . Atrial tachycardia, paroxysmal (HCC) 12/03/2008  . ASTHMA 12/03/2008  . C O P D 12/03/2008  . Essential hypertension 12/02/2008    Past Surgical History:  Procedure Laterality Date  . COLON SURGERY    . COLONOSCOPY N/A 02/08/2013   Procedure: COLONOSCOPY;  Surgeon: Patrick D Hung, MD;  [facility];  Service: Endoscopy;  Laterality: N/A;  . COLONOSCOPY WITH PROPOFOL N/A 05/09/2014   Procedure: COLONOSCOPY WITH PROPOFOL;  Surgeon: Patrick Hung, MD;  [facility];  Service: Endoscopy;  Laterality: N/A;  . COLONOSCOPY WITH PROPOFOL N/A 01/08/2016   Procedure: COLONOSCOPY WITH PROPOFOL;  Surgeon: Patrick Hung, MD;  [facility];  Service: Endoscopy;  Laterality: N/A;  . COLOSTOMY N/A 04/19/2016   Procedure: COLOSTOMY;  Surgeon: James Wyatt, MD;  [facility];  Service: General;  Laterality: N/A;  . COLOSTOMY REVERSAL  04/12/2016  . COLOSTOMY REVERSAL N/A 04/12/2016   Procedure: COLOSTOMY REVERSAL;  Surgeon: James Wyatt, MD;  [facility];  Service: General;  Laterality: N/A;  . CORONARY ANGIOPLASTY WITH STENT PLACEMENT  11/12/2008; 06/11/2014   stent x 2 to RCA; stent x 2 to LAD  . CORONARY ANGIOPLASTY WITH STENT PLACEMENT  06/11/2014   m-LAD 3.5 x 16 mm Synergy DES, d-LAD  2.25 x 16 mm Synergy DES  . ESOPHAGOGASTRODUODENOSCOPY N/A 02/08/2013   Procedure: ESOPHAGOGASTRODUODENOSCOPY (EGD);  Surgeon: Patrick D Hung, MD;  [facility];  Service: Endoscopy;  Laterality: N/A;  . FLEXIBLE SIGMOIDOSCOPY N/A 02/19/2016   Procedure: FLEXIBLE SIGMOIDOSCOPY;  Surgeon: Patrick Hung, MD;  [facility];  Service: Endoscopy;  Laterality: N/A;  . LAPAROTOMY N/A 02/12/2013   Procedure: EXPLORATORY LAPAROTOMY PARTIAL COLECTOMY WITH COLOSTOMY;  Surgeon: James O Wyatt, MD;  [facility];  Service: General;  Laterality: N/A;  . LAPAROTOMY N/A  02/18/2013   Procedure: EXPLORATORY LAPAROTOMY/Closure of Wound;  Surgeon: Armando Ramirez, MD;  [facility];  Service: General;  Laterality: N/A;  . LAPAROTOMY N/A 04/19/2016   Procedure: EXPLORATORY LAPAROTOMY, REPAIR OF ANASTAMOTIC LEAK;  Surgeon: James Wyatt, MD;  [facility];  Service: General;  Laterality: N/A;  . LEFT HEART CATHETERIZATION WITH CORONARY ANGIOGRAM N/A 06/11/2014   Procedure: LEFT HEART CATHETERIZATION WITH CORONARY ANGIOGRAM;  Surgeon: Michael Cooper, MD; CFX calcified, 30-40 percent, RCA calcified, 40/50/40%, PDA diffuse disease, LAD 40/75/90% s/p DES 2         Home Medications    Prior to Admission medications   Medication Sig Start Date End Date Taking? Authorizing Provider  aspirin EC 81 MG tablet Take 1 tablet (81 mg total) by mouth daily. 03/06/13   Love, Pamela S, PA-C  atorvastatin (LIPITOR) 40 MG tablet Take 1 tablet (40 mg total) by mouth daily. 09/20/16   Gerhardt, Lori C, NP    clopidogrel (PLAVIX) 75 MG tablet Take 75 mg by mouth daily.    [provider]  diltiazem (CARDIZEM) 60 MG tablet Take 1.5 tablets (90 mg total) by mouth 3 (three) times daily. 05/03/17   Burtis Junes, NP  ferrous sulfate 325 (65 FE) MG tablet Take 1 tablet (325 mg total) by mouth daily with breakfast. 01/15/14   Burtis Junes, NP  furosemide (LASIX) 40 MG tablet Take 2 tablets (80 mg total) by mouth daily. To take 80 mg daily for one week - then decrease to 60 mg a day. 02/22/17   Burtis Junes, NP  levalbuterol (XOPENEX HFA) 45 MCG/ACT inhaler INHALE 2 PUFFS INTO THE LUNGS EVERY 4 (FOUR) HOURS AS NEEDED FOR WHEEZING. 12/21/15   Burtis Junes, NP  metoprolol tartrate (LOPRESSOR) 25 MG tablet Take 1.5 tablets (37.5 mg total) by mouth 2 (two) times daily. 09/20/16   Burtis Junes, NP  Multiple Vitamins-Minerals (MULTIVITAMIN WITH MINERALS) tablet Take 1 tablet by mouth daily.    [provider]  nitroGLYCERIN (NITROSTAT) 0.4 MG SL tablet Place 0.4 mg  under the tongue every 5 (five) minutes as needed for chest pain.    [provider]  simethicone (MYLICON) 703 MG chewable tablet Chew 125 mg by mouth 3 (three) times daily.     [provider]  spironolactone (ALDACTONE) 25 MG tablet Take 0.5 tablets (12.5 mg total) by mouth daily. 05/03/17 08/01/17  Burtis Junes, NP    Family History Family History  Problem Relation Age of Onset  . Hypertension Father   . Heart disease Father        before age 87  . Diabetes Father   . Hypertension Mother   . Dementia Mother   . Parkinsonism Mother   . Liver cancer Maternal Grandmother        dx in her 60s  . Prostate cancer Maternal Uncle   . Leukemia Maternal Uncle     Social History Social History   Tobacco Use  . Smoking status: Former Smoker    Packs/day: 1.00    Years: 38.00    Pack years: 38.00    Types: Cigarettes    Last attempt to quit: 07/15/2008    Years since quitting: 8.9  . Smokeless tobacco: Never Used  Substance Use Topics  . Alcohol use: Yes    Alcohol/week: 0.0 oz    Comment: none snice colostomy placed  . Drug use: No     Allergies   No known allergies   Review of Systems Review of Systems All systems reviewed and negative, other than as noted in HPI.   Physical Exam Updated Vital Signs BP (!) 101/54   Pulse (!) 105   Temp (!) 100.5 F (38.1 C) (Rectal)   Resp (!) 31   SpO2 97%   Physical Exam  Constitutional: He appears well-developed and well-nourished. No distress.  Laying in bed.  Appears tired, but not toxic/distressed.  Obese.  HENT:  Head: Normocephalic and atraumatic.  Eyes: Conjunctivae are normal. Right eye exhibits no discharge. Left eye exhibits no discharge.  Neck: Neck supple.  Cardiovascular: Regular rhythm and normal heart sounds. Exam reveals no gallop and no friction rub.  No murmur heard. Mild tachycardia  Pulmonary/Chest: Effort normal and breath sounds normal. No respiratory distress.  Abdominal: Soft.  He exhibits no distension. There is no tenderness.  Ileostomy with brown watery output.  Chronic abdominal wound appears to be healing well.  There is no  significant drainage or concerning surrounding skin changes.  His abdomen is soft.  No appreciable tenderness.  Musculoskeletal: He exhibits no edema or tenderness.  Neurological: He is alert.  Skin: Skin is warm and dry.  Psychiatric: He has a normal mood and affect. His behavior is normal. Thought content normal.  Nursing note and vitals reviewed.    ED Treatments / Results  Labs (all labs ordered are listed, but only abnormal results are displayed) Labs Reviewed  BASIC METABOLIC PANEL - Abnormal; Notable for the following components:      Result Value   CO2 19 (*)    Glucose, Bld 108 (*)    BUN 35 (*)    Creatinine, Ser 3.60 (*)    GFR calc non Af Amer 16 (*)    GFR calc Af Amer 19 (*)    All other components within normal limits  CBC - Abnormal; Notable for the following components:   WBC 16.3 (*)    Hemoglobin 12.5 (*)    HCT 38.2 (*)    Platelets 140 (*)    All other components within normal limits  URINALYSIS, ROUTINE W REFLEX MICROSCOPIC - Abnormal; Notable for the following components:   Color, Urine AMBER (*)    APPearance CLOUDY (*)    Hgb urine dipstick LARGE (*)    Protein, ur 100 (*)    Leukocytes, UA MODERATE (*)    Bacteria, UA FEW (*)    All other components within normal limits  PROTIME-INR - Abnormal; Notable for the following components:   Prothrombin Time 16.2 (*)    All other components within normal limits  I-STAT CG4 LACTIC ACID, ED - Abnormal; Notable for the following components:   Lactic Acid, Venous 2.25 (*)    All other components within normal limits  CULTURE, BLOOD (ROUTINE X 2)  CULTURE, BLOOD (ROUTINE X 2)  URINE CULTURE  CBG MONITORING, ED  I-STAT CG4 LACTIC ACID, ED    EKG EKG Interpretation  Date/Time:  Friday Jun 30 2017 10:48:21 EDT Ventricular Rate:  98 PR  Interval:  186 QRS Duration: 104 QT Interval:  334 QTC Calculation: 426 R Axis:   77 Text Interpretation:  Normal sinus rhythm Nonspecific T wave abnormality Abnormal ECG Confirmed by Virgel Manifold [phone]) on 06/30/2017 1:58:44 PM   Radiology Dg Chest 2 View  Result Date: 06/30/2017 CLINICAL DATA:  Acute chest pain and shortness of breath for 1 day. EXAM: CHEST - 2 VIEW COMPARISON:  05/26/2016 and prior radiograph FINDINGS: UPPER limits normal heart size noted. Mild bibasilar atelectasis/scarring is unchanged. There is no evidence of focal airspace disease, pulmonary edema, suspicious pulmonary nodule/mass, pleural effusion, or pneumothorax. No acute bony abnormalities are identified. IMPRESSION: No evidence of acute cardiopulmonary disease. Electronically Signed   By: Margarette Canada M.D.   On: 06/30/2017 13:24    Procedures Procedures (including critical care time)  Medications Ordered in ED Medications  cefTRIAXone (ROCEPHIN) 2 g in sodium chloride 0.9 % 100 mL IVPB (2 g Intravenous New Bag/Given 06/30/17 1531)  0.9 %  sodium chloride infusion (has no administration in time range)  sodium chloride 0.9 % bolus 1,000 mL (1,000 mLs Intravenous New Bag/Given 06/30/17 1400)  sodium chloride 0.9 % bolus 1,000 mL (1,000 mLs Intravenous New Bag/Given 06/30/17 1430)  acetaminophen (TYLENOL) tablet 1,000 mg (1,000 mg Oral Given 06/30/17 1514)     Initial Impression / Assessment and Plan / ED Course  I have reviewed the triage vital signs and the nursing notes.  Pertinent labs & imaging results that were available during my care of the patient were reviewed by me and considered in my medical decision making (see chart for details).     66yM with generalized weakness. Temp 100.5. Really just c/o fatigue. No acute complaints otherwise. Baseline very debilitated but today was having just pulling himself up. Likely from UTI. Abdominal wound looks like it is healing ok. Abdominal exam is overall  unimpressive but significant surgical procedures just a few months ago. Will CT. AKI and soft BP. IVF. Admit.   Final Clinical Impressions(s) / ED Diagnoses   Final diagnoses:  Urinary tract infection with hematuria, site unspecified  Generalized weakness  AKI (acute kidney injury) (HCC)    ED Discharge Orders    None       , , MD 06/30/17 1607  

## 2017-06-30 NOTE — Anesthesia Postprocedure Evaluation (Signed)
Anesthesia Post Note  Patient: Larry [name] Dec  Procedure(s) Performed: CYSTOSCOPY WITH RETROGRADE PYELOGRAM/URETERAL STENT PLACEMENT (Left Bladder)     Patient location during evaluation: PACU Anesthesia Type: General Level of consciousness: awake and alert Pain management: pain level controlled Vital Signs Assessment: post-procedure vital signs reviewed and stable Respiratory status: spontaneous breathing, nonlabored ventilation, respiratory function stable and patient connected to nasal cannula oxygen Cardiovascular status: blood pressure returned to baseline and stable Postop Assessment: no apparent nausea or vomiting Anesthetic complications: no    Last Vitals:  Vitals:   06/30/17 2045 06/30/17 2125  BP: 128/70 123/78  Pulse: (!) 121 (!) 115  Resp: (!) 22 18  Temp: 37.4 C 37.7 C  SpO2: 97% 99%    Last Pain:  Vitals:   06/30/17 2125  TempSrc: Oral  PainSc:                  [name]

## 2017-06-30 NOTE — Anesthesia Procedure Notes (Signed)
Procedure Name: Intubation Date/Time: 06/30/2017 6:38 PM Performed by: Oletta Lamas, CRNA Pre-anesthesia Checklist: Patient identified, Emergency Drugs available, Suction available and Patient being monitored Patient Re-evaluated:Patient Re-evaluated prior to induction Oxygen Delivery Method: Circle System Utilized Preoxygenation: Pre-oxygenation with 100% oxygen Induction Type: IV induction Ventilation: Mask ventilation without difficulty Laryngoscope Size: Glidescope and 4 Grade View: Grade I Tube type: Oral Number of attempts: 1 Airway Equipment and Method: Stylet Placement Confirmation: ETT inserted through vocal cords under direct vision,  positive ETCO2 and breath sounds checked- equal and bilateral Secured at: 23 cm Tube secured with: Tape Dental Injury: Teeth and Oropharynx as per pre-operative assessment

## 2017-06-30 NOTE — Telephone Encounter (Signed)
STAT if HR is under 50 or over 120 (normal HR is 60-100 beats per minute)  1) What is your heart rate? 103  2) Do you have a log of your heart rate readings (document readings)? 70-80  3) Do you have any other symptoms? 02 stats is 101

## 2017-06-30 NOTE — H&P (Addendum)
History and Physical    Larry [name] TCY:[id number] DOB: [birth year] DOA: 06/30/2017  **Will admit patient based on the expectation that the patient will need hospitalization/ hospital care that crosses at least 2 midnights  PCP: Lin Landsman, MD   Attending physician: Tanaysha Alkins  Patient coming from/Resides with: Private residence/wife  Chief Complaint: Dizziness, low-grade fever  HPI: Larry [name] is a 67 y.o. male with medical history significant for ectopic atrial tachycardia followed by EP, three-vessel CAD status post stents to RCA in 2010, chronic venous stasis, history of grade 1 diastolic dysfunction, stage III chronic kidney disease, hypertension, sleep apnea on CPAP.  Patient also has a history of colon cancer and underwent a colostomy reversal in 1216 that was complicated with postoperative anastomotic leak, respiratory failure requiring tracheostomy and LTAC stay.  He now has a chronic ileostomy and has a wound healing by secondary intention that is almost healed.  Developed a dizziness this morning and generalized malaise.  He has had several days of urinary urgency.  Patient was found with low-grade oral temperature 99.9 in the ER.  He was not hypoxic.  He had suboptimal blood pressure readings with tachycardia and tachypnea.  She will serum lactate was elevated at 2.25.  He had mild acute kidney injury and leukocytosis of 16,300.  He was given normal saline fluid challenges.  Urinalysis was abnormal and concerning for UTI so blood cultures and urine cultures were also obtained and he is been given empiric Rocephin.  Repeat lactic acid has normalized at 1.54.  CT abdomen and pelvis pending at time of dictation.  ED Course:  Vital Signs: BP 115/62   Pulse (!) 125   Temp (!) 100.5 F (38.1 C) (Rectal)   Resp (!) 24   SpO2 95%  2 view CXR: Neg CT abdomen and pelvis: Pending Lab data: Sodium 139, potassium 3.8, chloride 107, CO2 19, anion gap 13, glucose 108, BUN 35,  creatinine 3.6, lactic acid 2.25 down to 1.54 after treatment of sepsis physiology, white count 16,300 differential not obtained, hemoglobin 12.5, platelets 140,000, PT 16.2, INR 1.31, urinalysis abnormal as follows: Elderly appearance, amber color, large hemoglobin, moderate leukocytes, nitrite negative, protein 100, bacteria few, RBCs 21-50, WBCs 21-50, blood cultures and urine culture obtained in ER prior to initiation of antibiotics. Medications and treatments: NS bolus x2 L, Tylenol 1000 mg p.o. x1, Rocephin 2 g IV x1  Review of Systems:  In addition to the HPI above,  No Headache, changes with Vision or hearing, new weakness, tingling, numbness in any extremity,  dysarthria or word finding difficulty, gait disturbance or imbalance, tremors or seizure activity No problems swallowing food or Liquids, indigestion/reflux, choking or coughing while eating, abdominal pain with or after eating No Chest pain, Cough or Shortness of Breath, palpitations, orthopnea or DOE No Abdominal pain, N/V, melena,hematochezia, dark tarry stools, constipation No dysuria, malodorous urine, hematuria or flank pain No new skin rashes, lesions, masses or bruises, No new joint pains, aches, swelling or redness No recent unintentional weight gain or loss No polyuria, polydypsia or polyphagia   Past Medical History:  Diagnosis Date  . Arthritis   . Atrial tachycardia ([place])    managed on beta blocker therapy  . Childhood asthma    "went away after I was 16"  . Chronic combined systolic and diastolic CHF, NYHA class 3 (HCC)    has diastolic heart failure grade 1; EF is 45 to 50% per echo 05/2011; EF 41% by Myoview 2016  .  CKD (chronic kidney disease) stage 3, GFR 30-59 ml/min (HCC)   . Colon cancer ([place]) 2015   MSI high; IHC loss of MLH1 and PMS2; BRAF negative; Negative methylation  . COPD (chronic obstructive pulmonary disease) ([place])   . Coronary artery disease   . Family history of ovarian cancer   .  Hypercholesteremia   . Hypertension   . Noncompliance   . NSVT (nonsustained ventricular tachycardia) (HCC)    beta blocker restarted  . Obesity   . OSA on CPAP    used nightly, pt does not know settings  . Peripheral arterial disease (HCC)    4.2 cm thoracic aortic aneurysm per chest ct 11-14-15 epic  . S/P colostomy ([place])    2014  . Thrombocytopenia ([place])   . Torsades de pointes ([place])    X 2 episodes during hospital visit 12'14"electrolyte imbalance"- "Shocked"    Past Surgical History:  Procedure Laterality Date  . COLON SURGERY    . COLONOSCOPY N/A 02/08/2013   Procedure: COLONOSCOPY;  Surgeon: [name], MD;  Location: [facility];  Service: Endoscopy;  Laterality: N/A;  . COLONOSCOPY WITH PROPOFOL N/A 05/09/2014   Procedure: COLONOSCOPY WITH PROPOFOL;  Surgeon: [name], MD;  Location: [facility];  Service: Endoscopy;  Laterality: N/A;  . COLONOSCOPY WITH PROPOFOL N/A 01/08/2016   Procedure: COLONOSCOPY WITH PROPOFOL;  Surgeon: [name], MD;  Location: [facility];  Service: Endoscopy;  Laterality: N/A;  . COLOSTOMY N/A 04/19/2016   Procedure: COLOSTOMY;  Surgeon: Judeth Horn, MD;  Location: [facility];  Service: General;  Laterality: N/A;  . COLOSTOMY REVERSAL  04/12/2016  . COLOSTOMY REVERSAL N/A 04/12/2016   Procedure: COLOSTOMY REVERSAL;  Surgeon: Judeth Horn, MD;  Location: [facility];  Service: General;  Laterality: N/A;  . CORONARY ANGIOPLASTY WITH STENT PLACEMENT  11/12/2008; 06/11/2014   stent x 2 to RCA; stent x 2 to LAD  . CORONARY ANGIOPLASTY WITH STENT PLACEMENT  06/11/2014   m-LAD 3.5 x 16 mm Synergy DES, d-LAD  2.25 x 16 mm Synergy DES  . ESOPHAGOGASTRODUODENOSCOPY N/A 02/08/2013   Procedure: ESOPHAGOGASTRODUODENOSCOPY (EGD);  Surgeon: [name], MD;  Location[facility] END[facility]COPY;  Service: Endoscopy;  Laterality: N/A;  . FLEXIBLE SIGMOIDOSCOPY N/A 02/19/2016   Procedure: FLEXIBLE SIGMOIDOSCOPY;  Surgeon: [name], MD;  Location: [facility];  Service:  Endoscopy;  Laterality: N/A;  . LAPAROTOMY N/A 02/12/2013   Procedure: EXPLORATORY LAPAROTOMY PARTIAL COLECTOMY WITH COLOSTOMY;  Surgeon: [name], MD;  Location: [place];  Service: General;  Laterality: N/A;  . LAPAROTOMY N/A 02/18/2013   Procedure: EXPLORATORY LAPAROTOMY/Closure of Wound;  Surgeon: [name], MD;  Location: [place];  Service: General;  Laterality: N/A;  . LAPAROTOMY N/A 04/19/2016   Procedure: EXPLORATORY LAPAROTOMY, REPAIR OF ANASTAMOTIC LEAK;  Surgeon: Judeth Horn, MD;  Location: [place];  Service: General;  Laterality: N/A;  . LEFT HEART CATHETERIZATION WITH CORONARY ANGIOGRAM N/A 06/11/2014   Procedure: LEFT HEART CATHETERIZATION WITH CORONARY ANGIOGRAM;  Surgeon: [name], MD; CFX calcified, 30-40 percent, RCA calcified, 40/50/40%, PDA diffuse disease, LAD 40/75/90% s/p DES 2     Social History   Socioeconomic History  . Marital status: Married    Spouse name: [name]  . Number of children: 1  . Years of education: Not on file  . Highest education level: Not on file  Occupational History  . Not on file  Social Needs  . Financial resource strain: Not on file  . Food insecurity:    Worry: Not  on file    Inability: Not on file  . Transportation needs:    Medical: Not on file    Non-medical: Not on file  Tobacco Use  . Smoking status: Former Smoker    Packs/day: 1.00    Years: 38.00    Pack years: 38.00    Types: Cigarettes    Last attempt to quit: 07/15/2008    Years since quitting: 8.9  . Smokeless tobacco: Never Used  Substance and Sexual Activity  . Alcohol use: Yes    Alcohol/week: 0.0 oz    Comment: none snice colostomy placed  . Drug use: No  . Sexual activity: Not Currently  Lifestyle  . Physical activity:    Days per week: Not on file    Minutes per session: Not on file  . Stress: Not on file  Relationships  . Social connections:    Talks on phone: Not on file    Gets together: Not on file    Attends religious service: Not on  file    Active member of club or organization: Not on file    Attends meetings of clubs or organizations: Not on file    Relationship status: Not on file  . Intimate partner violence:    Fear of current or ex partner: Not on file    Emotionally abused: Not on file    Physically abused: Not on file    Forced sexual activity: Not on file  Other Topics Concern  . Not on file  Social History Narrative  . Not on file    Mobility: Independent Work history: Not obtained   No Known Allergies  Family History  Problem Relation Age of Onset  . Hypertension Father   . Heart disease Father        before age 54  . Diabetes Father   . Hypertension Mother   . Dementia Mother   . Parkinsonism Mother   . Liver cancer Maternal Grandmother        dx in her 69s  . Prostate cancer Maternal Uncle   . Leukemia Maternal Uncle      Prior to Admission medications   Medication Sig Start Date End Date Taking? Authorizing Provider  aspirin EC 81 MG tablet Take 1 tablet (81 mg total) by mouth daily. 03/06/13  Yes Love, Ivan Anchors, PA-C  atorvastatin (LIPITOR) 40 MG tablet Take 1 tablet (40 mg total) by mouth daily. Patient taking differently: Take 40 mg by mouth at bedtime.  09/20/16  Yes Burtis Junes, NP  clopidogrel (PLAVIX) 75 MG tablet Take 75 mg by mouth daily.   Yes [provider]  diltiazem (CARDIZEM) 60 MG tablet Take 1.5 tablets (90 mg total) by mouth 3 (three) times daily. Patient taking differently: Take 60 mg by mouth 3 (three) times daily.  05/03/17  Yes Burtis Junes, NP  ferrous sulfate 325 (65 FE) MG tablet Take 1 tablet (325 mg total) by mouth daily with breakfast. 01/15/14  Yes Burtis Junes, NP  furosemide (LASIX) 40 MG tablet Take 2 tablets (80 mg total) by mouth daily. To take 80 mg daily for one week - then decrease to 60 mg a day. Patient taking differently: Take 60 mg by mouth daily with lunch.  02/22/17  Yes Burtis Junes, NP  levalbuterol (XOPENEX HFA) 45  MCG/ACT inhaler INHALE 2 PUFFS INTO THE LUNGS EVERY 4 (FOUR) HOURS AS NEEDED FOR WHEEZING. 12/21/15  Yes Burtis Junes, NP  metoprolol tartrate (LOPRESSOR)  25 MG tablet Take 1.5 tablets (37.5 mg total) by mouth 2 (two) times daily. Patient taking differently: Take 50 mg by mouth 2 (two) times daily.  09/20/16  Yes Burtis Junes, NP  Multiple Vitamins-Minerals (CENTRUM SILVER 50+MEN) TABS Take 1 tablet by mouth daily with lunch.   Yes [provider]  simethicone (GAS-X EXTRA STRENGTH) 125 MG chewable tablet Chew 125 mg by mouth 3 (three) times daily.   Yes [provider]  spironolactone (ALDACTONE) 25 MG tablet Take 0.5 tablets (12.5 mg total) by mouth daily. Patient taking differently: Take 12.5 mg by mouth daily with lunch.  05/03/17 08/01/17 Yes Burtis Junes, NP  simethicone (MYLICON) 390 MG chewable tablet Chew 125 mg by mouth 3 (three) times daily.     [provider]    Physical Exam: Vitals:   06/30/17 1423 06/30/17 1530 06/30/17 1615 06/30/17 1700  BP:  132/86 115/62 (!) 116/48  Pulse:   (!) 125 (!) 125  Resp:  (!) 23 (!) 24 (!) 22  Temp: (!) 100.5 F (38.1 C)     TempSrc: Rectal     SpO2:   95% 96%      Constitutional: NAD, calm, comfortable Eyes: PERRL, lids and conjunctivae normal ENMT: Mucous membranes are moist. Posterior pharynx clear of any exudate or lesions.Normal dentition.  Neck: normal, supple, no masses, no thyromegaly Respiratory: clear to auscultation bilaterally, no wheezing, no crackles. Normal respiratory effort. No accessory muscle use.  Cardiovascular: Regular rate and rhythm, no murmurs / rubs / gallops. No extremity edema. 2+ pedal pulses. No carotid bruits.  Abdomen: no tenderness, no masses palpated. No hepatosplenomegaly. Bowel sounds positive.  Right side ileostomy with large volume liquid green stool as well as large volume flatus.  Chronic slow healing abdominal wound not examined noting dressing in  place. Musculoskeletal: no clubbing / cyanosis. No joint deformity upper and lower extremities. Good ROM, no contractures. Normal muscle tone.  Skin: no rashes, lesions, ulcers. No induration Neurologic: CN 2-12 grossly intact. Sensation intact, DTR normal. Strength 5/5 x all 4 extremities.  Psychiatric: Normal judgment and insight. Alert and oriented x 3. Normal mood.    Labs on Admission: I have personally reviewed following labs and imaging studies  CBC: Recent Labs  Lab 06/30/17 1112  WBC 16.3*  HGB 12.5*  HCT 38.2*  MCV 89.3  PLT 300*   Basic Metabolic Panel: Recent Labs  Lab 06/30/17 1112  NA 139  K 3.8  CL 107  CO2 19*  GLUCOSE 108*  BUN 35*  CREATININE 3.60*  CALCIUM 9.6   GFR: CrCl cannot be calculated (Unknown ideal weight.). Liver Function Tests: No results for input(s): AST, ALT, ALKPHOS, BILITOT, PROT, ALBUMIN in the last 168 hours. No results for input(s): LIPASE, AMYLASE in the last 168 hours. No results for input(s): AMMONIA in the last 168 hours. Coagulation Profile: Recent Labs  Lab 06/30/17 1113  INR 1.31   Cardiac Enzymes: No results for input(s): CKTOTAL, CKMB, CKMBINDEX, TROPONINI in the last 168 hours. BNP (last 3 results) Recent Labs    02/22/17 0926 05/03/17 1021  PROBNP 87 126   HbA1C: No results for input(s): HGBA1C in the last 72 hours. CBG: Recent Labs  Lab 06/30/17 1421  GLUCAP 98   Lipid Profile: No results for input(s): CHOL, HDL, LDLCALC, TRIG, CHOLHDL, LDLDIRECT in the last 72 hours. Thyroid Function Tests: No results for input(s): TSH, T4TOTAL, FREET4, T3FREE, THYROIDAB in the last 72 hours. Anemia Panel: No results for  input(s): VITAMINB12, FOLATE, FERRITIN, TIBC, IRON, RETICCTPCT in the last 72 hours. Urine analysis:    Component Value Date/Time   COLORURINE AMBER (A) 06/30/2017 1445   APPEARANCEUR CLOUDY (A) 06/30/2017 1445   LABSPEC 1.014 06/30/2017 1445   PHURINE 5.0 06/30/2017 1445   GLUCOSEU NEGATIVE  06/30/2017 1445   HGBUR LARGE (A) 06/30/2017 1445   BILIRUBINUR NEGATIVE 06/30/2017 1445   KETONESUR NEGATIVE 06/30/2017 1445   PROTEINUR 100 (A) 06/30/2017 1445   UROBILINOGEN 0.2 03/04/2013 1020   NITRITE NEGATIVE 06/30/2017 1445   LEUKOCYTESUR MODERATE (A) 06/30/2017 1445   Sepsis Labs: @LABRCNTIP (procalcitonin:4,lacticidven:4) )No results found for this or any previous visit (from the past 240 hour(s)).   Radiological Exams on Admission: Ct Abdomen Pelvis Wo Contrast  Result Date: 06/30/2017 CLINICAL DATA:  Left abdominal pain. Clinically suspected gastroenteritis or colitis. EXAM: CT ABDOMEN AND PELVIS WITHOUT CONTRAST TECHNIQUE: Multidetector CT imaging of the abdomen and pelvis was performed following the standard protocol without IV contrast. COMPARISON:  04/28/2016. FINDINGS: Lower chest: Minimal pericardial fluid. Atheromatous coronary artery calcifications. Minimal bibasilar atelectasis or scarring. Hepatobiliary: Multiple tiny gallstones in the gallbladder measuring up to 3 mm in maximum diameter each. No gallbladder wall thickening or pericholecystic fluid. Normal appearing liver. Pancreas: Unremarkable. No pancreatic ductal dilatation or surrounding inflammatory changes. Spleen: Normal in size without focal abnormality. Adrenals/Urinary Tract: Interval diffuse enlargement of the left kidney with mild perinephric and left periureteric soft tissue stranding and ill-defined margins of the kidney. There is also minimal dilatation of the left renal collecting system and ureter to the level of an interval 7 mm calculus in the distal left ureter. There are multiple small interval bladder calculi of similar size or smaller. There are also interval multiple similar-sized or smaller calculi within a right bladder diverticulum. Bilateral renal cysts are noted. Otherwise, unremarkable right kidney and right ureter. Unremarkable adrenal glands. Stomach/Bowel: Multiple sigmoid and descending colon  diverticula. Mid transverse colon anastomosis. Right lower quadrant small bowel ostomy. No bowel dilatation. Unremarkable stomach. Vascular/Lymphatic: Atheromatous arterial calcifications without aneurysm. No enlarged lymph nodes. Reproductive: Minimally enlarged prostate gland containing coarse calcifications. Other: Laxity of the midline abdominal wall extending to the skin. This has appearance compatible with an old open anterior abdominal wall surgical wound. Musculoskeletal: Lumbar and lower thoracic spine degenerative changes. IMPRESSION: 1. Interval 7 mm distal left ureteral calculus causing minimal left hydronephrosis and hydroureter. 2. Interval multiple bladder calculi. 3. Interval multiple calculi within a right-sided bladder diverticulum. 4. Sigmoid and descending colon diverticulosis. 5. Cholelithiasis. 6. Atheromatous arterial calcifications, including the coronary arteries. 7. Minimal pericardial effusion. Electronically Signed   By: Claudie Revering M.D.   On: 06/30/2017 16:43   Dg Chest 2 View  Result Date: 06/30/2017 CLINICAL DATA:  Acute chest pain and shortness of breath for 1 day. EXAM: CHEST - 2 VIEW COMPARISON:  05/26/2016 and prior radiograph FINDINGS: UPPER limits normal heart size noted. Mild bibasilar atelectasis/scarring is unchanged. There is no evidence of focal airspace disease, pulmonary edema, suspicious pulmonary nodule/mass, pleural effusion, or pneumothorax. No acute bony abnormalities are identified. IMPRESSION: No evidence of acute cardiopulmonary disease. Electronically Signed   By: Margarette Canada M.D.   On: 06/30/2017 13:24    EKG: (Independently reviewed) sinus rhythm with ventricular rate 98 bpm, QTC 426 ms, normal R wave rotation, nonspecific ST changes,, inferior leads with changes consistent with incomplete right bundle branch block  Assessment/Plan Principal Problem:   Sepsis due to urinary tract infection  -Patient presents with dizziness, low-grade fevers  and  urinary urgency with leukocytosis greater than 12,000, development of tachypnea and tachycardia, initial serum lactate, and abnormal urinalysis concerning for acute UTI -Lactate has normalized with administration of antibiotics and volume resuscitation -Continue NS at 100/hr -Rocephin IV-patient with history of Pseudomonas resistant to ceftazidime in 2018 but this occurred during patient's complicated history after anastomotic leak post colostomy reversal and patient had Foley catheter in place-suspect that this was CAUTI-no further Pseudomonas UTI documented -Follow-up on urine culture and blood cultures  Active Problems:   Acute kidney injury superimposed on chronic kidney disease III  -Baseline renal function: 27/2.00 -Current renal function: 35/3.60 -Suspect combination of acute sepsis physiology, concomitant administration of Lasix and Aldactone in context of ileostomy with primary thin liquid output contributory; patient with borderline hypotension therefore low perfusion also contributing to acute renal injury -Hydrate and follow labs -Avoid nephrotoxic agents -Follow-up on CT to ensure no evidence of hydronephrosis or renal abscess **7 mm distal left ureteral calculus causing minimal left hydronephrosis and hydroureter, multiple bladder calculi as well as multiple calculi within the right sided bladder diverticulum -Strain all urine -Discussed with Dr. Bell/Urology-given hydroureter with symptoms patient likely would benefit from ureteral stent placement-come to see patient and make determination as to whether this should proceed-NPO evaluated by urology    Coronary artery disease -History of stents to RCA in 2010 -Currently asymptomatic -Continue aspirin Plavix and statin -Beta-blocker on hold in context of sepsis physiology    Chronic diastolic heart failure -History of chronic diastolic heart failure as well as a history of ischemic cardiomyopathy -Most recent echocardiogram  February 2018: EF 55% with moderate LVH no valvular abnormalities -Given ileostomy with high liquid output consider discontinuation of diuretics -No ACE I/ARB secondary to CKD -On beta-blocker prior to admission -Daily weights, strict I/O    Hypertension -Current blood pressure suboptimal so diltiazem and Lopressor as well as diuretics on hold as above    Ectopic atrial tachycardia  -Sinus rhythm maintained with calcium channel blocker and beta-blocker -Both of these agents on hold secondary to suboptimal blood pressure readings in the context of sepsis physiology -We will provide Lopressor IV prn    History of colostomy reversal -Now with ileostomy and wound healing by secondary intention with slow progress -Patient can resume home wound care -Has thin liquid output from ileostomy and given reserved LV function on most recent echocardiogram May benefit from discontinuation of diuretics    OSA on CPAP -Continue nocturnal CPAP    COPD (chronic obstructive pulmonary disease)  -Not actively wheezing -Continue Xopenex MDI    Venous stasis -Stable skin changes    **Additional lab, imaging and/or diagnostic evaluation at discretion of supervising physician  DVT prophylaxis: Lovenox Code Status: Full Family Communication: Wife Disposition Plan: Home Consults called: Urology/Bell    Phyllicia Dudek ANP-BC [facility] Pager [phone]   If 7PM-7AM, please contact night-coverage [email or web address] Password [facility]  06/30/2017, 5:45 PM   I have examined the patient, reviewed the chart and discussed the plan with Drema Pry, NP.  Left nephrolithiasis, hydronephrosis and a UTI. Sepsis   Urology is taking him to OR for stent placement.   No h/o nephrolithiasis in the past but his ileostomy puts him at risk for uric acid stones. Urology to address management to prevent future stones. Agree with Rocephin.   Chronic d CHF Hold diuretics. Cont NS at 100  cc/hr.  Hypotension- AKI on CKD 3 - hold Cardizem but cont Lopressor to prevent atrial tachycardia - d/c diuretics -  follow I and O  Would down grade him from SDU to telemetry. Rest as above.   Debbe Odea, MD Pager: [email or web address]

## 2017-06-30 NOTE — Transfer of Care (Signed)
Immediate Anesthesia Transfer of Care Note  Patient: Larry [name]  Procedure(s) Performed: CYSTOSCOPY WITH RETROGRADE PYELOGRAM/URETERAL STENT PLACEMENT (Left Bladder)  Patient Location: PACU  Anesthesia Type:General  Level of Consciousness: awake, oriented, drowsy and patient cooperative  Airway & Oxygen Therapy: Patient Spontanous Breathing and Patient connected to face mask oxygen  Post-op Assessment: Report given to RN and Post -op Vital signs reviewed and stable  Post vital signs: Reviewed and stable  Last Vitals:  Vitals Value Taken Time  BP 130/89 06/30/2017  8:01 PM  Temp    Pulse 120 06/30/2017  8:05 PM  Resp 24 06/30/2017  8:05 PM  SpO2 99 % 06/30/2017  8:05 PM  Vitals shown include unvalidated device data.  Last Pain:  Vitals:   06/30/17 2000  TempSrc:   PainSc: (P) 0-No pain         Complications: No apparent anesthesia complications

## 2017-06-30 NOTE — Op Note (Signed)
Operative Note  Preoperative diagnosis:  1.  Left ureteral calculus with urinary tract infection and concern for sepsis 2.  Bladder calculi 3.  Right bladder diverticulum  Post operative diagnosis: 1.  Left ureteral calculus with urinary tract infection and concern for sepsis 2.  Bladder calculi 3.  Right bladder diverticulum   Procedure(s): 1.  Cystoscopy with left retrograde pyelogram and left ureteral stent placement 2.  Cystoscopy with evacuation/removal of bladder calculi  Surgeon: Link Snuffer, MD  Assistants: None  Anesthesia: General  Complications: None immediate  EBL: Minimal  Specimens: 1.  None  Drains/Catheters: 1.  6 X 26 double-J ureteral stent  Intraoperative findings: 1.  Normal urethra.  He had a short prostate but it was moderately high riding.  2.  Bladder mucosa was quite edematous.  The urine in the bladder was quite cloudy, hindering view.  There was a large right bladder diverticulum and there were multiple stones within that as well as the bladder.  The ureteral orifice was difficult to find within the edematous mucosa but I was able to finally find it to perform the surgery.  3.  Left retrograde pyelogram revealed a filling defect at the level of the stone with upstream ureteronephrosis but no significant hydronephrosis 4.  Small amount of purulent urine effluxed after wire placement from the left ureter. 5.  Clear reflux from the right ureteral orifice seen  Indication: 67 year old male who presented with 2-day history of fatigue and subjective fevers.  He was found to have a low-grade fever, tachycardia, leukocytosis, acute renal insufficiency.  CT scan revealed a left ureteral calculus as well as multiple bladder calculi and a right bladder diverticulum filled with bladder calculi as well.  Description of procedure:  The patient was identified and consent was obtained.  The patient was taken to the operating room and placed in the supine position.   The patient was placed under general anesthesia.  Perioperative antibiotics were administered.  The patient was placed in dorsal lithotomy.  Patient was prepped and draped in a standard sterile fashion and a timeout was performed.  A 21 French rigid cystoscope was advanced into the urethra and into the bladder.  Bladder calculi were evacuated.  There may have been some bladder calculi remaining that were unable to be evacuated due to size.  The bladder mucosa was quite edematous but I was finally able to identify the left ureteral orifice.  The left distal most portion of the ureter was cannulated with an open-ended ureteral catheter.  Retrograde pyelogram was performed with the findings noted above.  A sensor wire was then advanced up to the kidney under fluoroscopic guidance.  A 6 X 26 double-J ureteral stent was advanced up to the kidney under fluoroscopic guidance.  The wire was withdrawn and fluoroscopy confirmed good proximal placement and direct visualization confirmed a good coil within the bladder.  A Foley catheter was placed.This concluded the operation.  Patient tolerated procedure well and was stable postoperatively.  Plan: Transfer back to the medicine service.  Agree with broad-spectrum antibiotics until cultures return.  Remove Foley catheter once the patient has improved clinically.  I will need to schedule him for outpatient ureteroscopy with laser lithotripsy and possible cystolithotripsy.

## 2017-06-30 NOTE — Consult Note (Signed)
H&P Physician requesting consult: Virgel Manifold, MD  Chief Complaint: Left ureteral calculus  History of Present Illness: 67 year old male who was experiencing generalized weakness, fatigue, subjective fever for the past 2 days.  His home health nurse assessed him and noticed that he was tachycardic.  He was therefore sent to the emergency department.  In the emergency department, his lactic acid was mildly elevated to 2.25.  He had leukocytosis of 16.3.  Creatinine was 3.6, which is up from 2.18 on 05/18/2017.  He has a low-grade temperature of 100.5 taken rectally.  Upon initial presentation, he was borderline hypotensive.  He remains tachycardic in the 120s.  CT scan of the abdomen and pelvis revealed multiple bladder stones as well as a 7 mm distal left ureteral calculus.  Given this finding and his septic picture, I was consulted.  The patient denies having any pain.  Past Medical History:  Diagnosis Date  . Arthritis   . Atrial tachycardia ([place])    managed on beta blocker therapy  . Childhood asthma    "went away after I was 16"  . Chronic combined systolic and diastolic CHF, NYHA class 3 (HCC)    has diastolic heart failure grade 1; EF is 45 to 50% per echo 05/2011; EF 41% by Myoview 2016  . CKD (chronic kidney disease) stage 3, GFR 30-59 ml/min (HCC)   . Colon cancer ([place]) 2015   MSI high; IHC loss of MLH1 and PMS2; BRAF negative; Negative methylation  . COPD (chronic obstructive pulmonary disease) ([place])   . Coronary artery disease   . Family history of ovarian cancer   . Hypercholesteremia   . Hypertension   . Noncompliance   . NSVT (nonsustained ventricular tachycardia) (HCC)    beta blocker restarted  . Obesity   . OSA on CPAP    used nightly, pt does not know settings  . Peripheral arterial disease (HCC)    4.2 cm thoracic aortic aneurysm per chest ct 11-14-15 epic  . S/P colostomy ([place])    2014  . Thrombocytopenia ([place])   . Torsades de pointes ([place])    X 2 episodes  during hospital visit 12'14"electrolyte imbalance"- "Shocked"   Past Surgical History:  Procedure Laterality Date  . COLON SURGERY    . COLONOSCOPY N/A 02/08/2013   Procedure: COLONOSCOPY;  Surgeon: [name], MD;  Location: [facility];  Service: Endoscopy;  Laterality: N/A;  . COLONOSCOPY WITH PROPOFOL N/A 05/09/2014   Procedure: COLONOSCOPY WITH PROPOFOL;  Surgeon: [name], MD;  Location: [facility];  Service: Endoscopy;  Laterality: N/A;  . COLONOSCOPY WITH PROPOFOL N/A 01/08/2016   Procedure: COLONOSCOPY WITH PROPOFOL;  Surgeon: [name], MD;  Location: [facility];  Service: Endoscopy;  Laterality: N/A;  . COLOSTOMY N/A 04/19/2016   Procedure: COLOSTOMY;  Surgeon: Judeth Horn, MD;  Location: [facility];  Service: General;  Laterality: N/A;  . COLOSTOMY REVERSAL  04/12/2016  . COLOSTOMY REVERSAL N/A 04/12/2016   Procedure: COLOSTOMY REVERSAL;  Surgeon: Judeth Horn, MD;  Location: [facility];  Service: General;  Laterality: N/A;  . CORONARY ANGIOPLASTY WITH STENT PLACEMENT  11/12/2008; 06/11/2014   stent x 2 to RCA; stent x 2 to LAD  . CORONARY ANGIOPLASTY WITH STENT PLACEMENT  06/11/2014   m-LAD 3.5 x 16 mm Synergy DES, d-LAD  2.25 x 16 mm Synergy DES  . ESOPHAGOGASTRODUODENOSCOPY N/A 02/08/2013   Procedure: ESOPHAGOGASTRODUODENOSCOPY (EGD);  Surgeon: [name], MD;  Location[facility] ENDOSCOPY;  Service: Endoscopy;  Laterality: N/A;  .  FLEXIBLE SIGMOIDOSCOPY N/A 02/19/2016   Procedure: FLEXIBLE SIGMOIDOSCOPY;  Surgeon: [name], MD;  Location: [facility];  Service: Endoscopy;  Laterality: N/A;  . LAPAROTOMY N/A 02/12/2013   Procedure: EXPLORATORY LAPAROTOMY PARTIAL COLECTOMY WITH COLOSTOMY;  Surgeon: [name], MD;  Location: [facility];  Service: General;  Laterality: N/A;  . LAPAROTOMY N/A 02/18/2013   Procedure: EXPLORATORY LAPAROTOMY/Closure of Wound;  Surgeon: [name], MD;  Location: [facility];  Service: General;  Laterality: N/A;  . LAPAROTOMY N/A 04/19/2016    Procedure: EXPLORATORY LAPAROTOMY, REPAIR OF ANASTAMOTIC LEAK;  Surgeon: Judeth Horn, MD;  Location: [facility];  Service: General;  Laterality: N/A;  . LEFT HEART CATHETERIZATION WITH CORONARY ANGIOGRAM N/A 06/11/2014   Procedure: LEFT HEART CATHETERIZATION WITH CORONARY ANGIOGRAM;  Surgeon: [name], MD; CFX calcified, 30-40 percent, RCA calcified, 40/50/40%, PDA diffuse disease, LAD 40/75/90% s/p DES 2     Home Medications:   (Not in a hospital admission) Allergies: No Known Allergies  Family History  Problem Relation Age of Onset  . Hypertension Father   . Heart disease Father        before age 32  . Diabetes Father   . Hypertension Mother   . Dementia Mother   . Parkinsonism Mother   . Liver cancer Maternal Grandmother        dx in her 74s  . Prostate cancer Maternal Uncle   . Leukemia Maternal Uncle    Social History:  reports that he quit smoking about 8 years ago. His smoking use included cigarettes. He has a 38.00 pack-year smoking history. He has never used smokeless tobacco. He reports that he drinks alcohol. He reports that he does not use drugs.  ROS: A complete review of systems was performed.  All systems are negative except for pertinent findings as noted. ROS   Physical Exam:  Vital signs in last 24 hours: Temp:  [99.9 F (37.7 C)-100.5 F (38.1 C)] 100.5 F (38.1 C) (05/10 1423) Pulse Rate:  [98-125] 125 (05/10 1700) Resp:  [18-31] 22 (05/10 1700) BP: (101-132)/(48-86) 116/48 (05/10 1700) SpO2:  [95 %-98 %] 96 % (05/10 1700) General:  Alert and oriented, No acute distress HEENT: Normocephalic, atraumatic Neck: No JVD or lymphadenopathy Cardiovascular: Tachycardic Lungs: Regular rate and effort Abdomen: Soft, nontender, nondistended, no abdominal masses, morbidly obese, colostomy productive Back: No CVA tenderness Extremities: No edema Neurologic: Grossly intact  Laboratory Data:  Results for orders placed or performed during the hospital  encounter of 06/30/17 (from the past 24 hour(s))  I-Stat CG4 Lactic Acid, ED     Status: Abnormal   Collection Time: 06/30/17 11:07 AM  Result Value Ref Range   Lactic Acid, Venous 2.25 (HH) 0.5 - 1.9 mmol/L   Comment NOTIFIED PHYSICIAN   Basic metabolic panel     Status: Abnormal   Collection Time: 06/30/17 11:12 AM  Result Value Ref Range   Sodium 139 135 - 145 mmol/L   Potassium 3.8 3.5 - 5.1 mmol/L   Chloride 107 101 - 111 mmol/L   CO2 19 (L) 22 - 32 mmol/L   Glucose, Bld 108 (H) 65 - 99 mg/dL   BUN 35 (H) 6 - 20 mg/dL   Creatinine, Ser 3.60 (H) 0.61 - 1.24 mg/dL   Calcium 9.6 8.9 - 10.3 mg/dL   GFR calc non Af Amer 16 (L) >60 mL/min   GFR calc Af Amer 19 (L) >60 mL/min   Anion gap 13 5 - 15  CBC  Status: Abnormal   Collection Time: 06/30/17 11:12 AM  Result Value Ref Range   WBC 16.3 (H) 4.0 - 10.5 K/uL   RBC 4.28 4.22 - 5.81 MIL/uL   Hemoglobin 12.5 (L) 13.0 - 17.0 g/dL   HCT 38.2 (L) 39.0 - 52.0 %   MCV 89.3 78.0 - 100.0 fL   MCH 29.2 26.0 - 34.0 pg   MCHC 32.7 30.0 - 36.0 g/dL   RDW 14.6 11.5 - 15.5 %   Platelets 140 (L) 150 - 400 K/uL  Protime-INR     Status: Abnormal   Collection Time: 06/30/17 11:13 AM  Result Value Ref Range   Prothrombin Time 16.2 (H) 11.4 - 15.2 seconds   INR 1.31   CBG monitoring, ED     Status: None   Collection Time: 06/30/17  2:21 PM  Result Value Ref Range   Glucose-Capillary 98 65 - 99 mg/dL  I-Stat CG4 Lactic Acid, ED     Status: None   Collection Time: 06/30/17  2:25 PM  Result Value Ref Range   Lactic Acid, Venous 1.54 0.5 - 1.9 mmol/L  Urinalysis, Routine w reflex microscopic     Status: Abnormal   Collection Time: 06/30/17  2:45 PM  Result Value Ref Range   Color, Urine AMBER (A) YELLOW   APPearance CLOUDY (A) CLEAR   Specific Gravity, Urine 1.014 1.005 - 1.030   pH 5.0 5.0 - 8.0   Glucose, UA NEGATIVE NEGATIVE mg/dL   Hgb urine dipstick LARGE (A) NEGATIVE   Bilirubin Urine NEGATIVE NEGATIVE   Ketones, ur NEGATIVE  NEGATIVE mg/dL   Protein, ur 100 (A) NEGATIVE mg/dL   Nitrite NEGATIVE NEGATIVE   Leukocytes, UA MODERATE (A) NEGATIVE   RBC / HPF 21-50 0 - 5 RBC/hpf   WBC, UA 21-50 0 - 5 WBC/hpf   Bacteria, UA FEW (A) NONE SEEN   Squamous Epithelial / LPF 0-5 0 - 5   WBC Clumps PRESENT    Mucus PRESENT    No results found for this or any previous visit (from the past 240 hour(s)). Creatinine: Recent Labs    06/30/17 1112  CREATININE 3.60*    Impression/Assessment:  Left ureteral calculus Acute renal insufficiency on top of CKD stage III/IV Urinary tract infection with concern for early sepsis secondary to this  Plan:  Proceed to the operating room for urgent cystoscopy with left ureteral stent placement.  Agree with fluid resuscitation and antibiotics.  Marton Redwood, III 06/30/2017, 5:48 PM

## 2017-06-30 NOTE — Telephone Encounter (Signed)
Spoke with patient's Home Health Nurse Langley Gauss) who is concerned over patient's O2 saturation 88% with exertion.  His HR is fluctuating between 80-low 100s. BP stable at 130/80. He is diaphoretic, lightheaded and SOB.  He has trouble ambulating, which he frequently has no problem per RN.  We have agreed to have him transferred to the ED for further evaluation.

## 2017-06-30 NOTE — ED Notes (Signed)
Pt states he is currently unable to provide urine sample.

## 2017-06-30 NOTE — Anesthesia Preprocedure Evaluation (Addendum)
Anesthesia Evaluation  Patient identified by MRN, date of birth, ID band Patient awake    Reviewed: Allergy & Precautions, NPO status , Patient's Chart, lab work & pertinent test results, reviewed documented beta blocker date and time   Airway Mallampati: III  TM Distance: >3 FB Neck ROM: Full    Dental  (+) Dental Advisory Given, Chipped, Missing,    Pulmonary asthma , sleep apnea and Continuous Positive Airway Pressure Ventilation , COPD, former smoker,    Pulmonary exam normal breath sounds clear to auscultation       Cardiovascular hypertension, Pt. on home beta blockers and Pt. on medications + CAD, + Cardiac Stents, + Peripheral Vascular Disease and +CHF  + dysrhythmias Supra Ventricular Tachycardia  Rhythm:Regular Rate:Tachycardia  Echo 04/16/16: Study Conclusions  - Left ventricle: The cavity size was normal. Wall thickness was increased in a pattern of moderate LVH. Systolic function was normal. The estimated ejection fraction was in the range of 55% to 60%. Wall motion was normal; there were no regional wall motion abnormalities. The study is not technically sufficient to allow evaluation of LV diastolic function. - Aortic root: The aortic root was mildly dilated. - Mitral valve: Calcified annulus.   Neuro/Psych negative neurological ROS  negative psych ROS   GI/Hepatic negative GI ROS, Neg liver ROS,   Endo/Other  negative endocrine ROS  Renal/GU Renal InsufficiencyRenal disease     Musculoskeletal negative musculoskeletal ROS (+) Arthritis ,   Abdominal   Peds  Hematology  (+) Blood dyscrasia (Thrombocytopenia), anemia ,   Anesthesia Other Findings Day of surgery medications reviewed with the patient.  Reproductive/Obstetrics                             Anesthesia Physical Anesthesia Plan  ASA: III and emergent  Anesthesia Plan: General   Post-op Pain Management:     Induction: Intravenous  PONV Risk Score and Plan: 3 and Midazolam, Dexamethasone and Ondansetron  Airway Management Planned: Oral ETT and Video Laryngoscope Planned  Additional Equipment:   Intra-op Plan:   Post-operative Plan: Extubation in OR  Informed Consent: I have reviewed the patients History and Physical, chart, labs and discussed the procedure including the risks, benefits and alternatives for the proposed anesthesia with the patient or authorized representative who has indicated his/her understanding and acceptance.   Dental advisory given  Plan Discussed with: CRNA  Anesthesia Plan Comments: (Risks/benefits of general anesthesia discussed with patient including risk of damage to teeth, lips, gum, and tongue, nausea/vomiting, allergic reactions to medications, and the possibility of heart attack, stroke and death.  All patient questions answered.  Patient wishes to proceed.)       Anesthesia Quick Evaluation

## 2017-07-01 ENCOUNTER — Encounter (HOSPITAL_COMMUNITY): Payer: Self-pay

## 2017-07-01 ENCOUNTER — Other Ambulatory Visit: Payer: Self-pay

## 2017-07-01 LAB — HIV ANTIBODY (ROUTINE TESTING W REFLEX): HIV Screen 4th Generation wRfx: NONREACTIVE

## 2017-07-01 LAB — BLOOD CULTURE ID PANEL (REFLEXED)
Acinetobacter baumannii: NOT DETECTED
CANDIDA PARAPSILOSIS: NOT DETECTED
CARBAPENEM RESISTANCE: NOT DETECTED
Candida albicans: NOT DETECTED
Candida glabrata: NOT DETECTED
Candida krusei: NOT DETECTED
Candida tropicalis: NOT DETECTED
ENTEROBACTER CLOACAE COMPLEX: NOT DETECTED
Enterobacteriaceae species: NOT DETECTED
Enterococcus species: NOT DETECTED
Escherichia coli: NOT DETECTED
HAEMOPHILUS INFLUENZAE: NOT DETECTED
Klebsiella oxytoca: NOT DETECTED
Klebsiella pneumoniae: NOT DETECTED
Listeria monocytogenes: NOT DETECTED
NEISSERIA MENINGITIDIS: NOT DETECTED
PROTEUS SPECIES: NOT DETECTED
Pseudomonas aeruginosa: DETECTED — AB
SERRATIA MARCESCENS: NOT DETECTED
STAPHYLOCOCCUS AUREUS BCID: NOT DETECTED
STAPHYLOCOCCUS SPECIES: NOT DETECTED
STREPTOCOCCUS AGALACTIAE: NOT DETECTED
STREPTOCOCCUS SPECIES: NOT DETECTED
Streptococcus pneumoniae: NOT DETECTED
Streptococcus pyogenes: NOT DETECTED

## 2017-07-01 LAB — COMPREHENSIVE METABOLIC PANEL
ALBUMIN: 2.9 g/dL — AB (ref 3.5–5.0)
ALT: 20 U/L (ref 17–63)
ANION GAP: 10 (ref 5–15)
AST: 19 U/L (ref 15–41)
Alkaline Phosphatase: 100 U/L (ref 38–126)
BILIRUBIN TOTAL: 1.7 mg/dL — AB (ref 0.3–1.2)
BUN: 37 mg/dL — AB (ref 6–20)
CHLORIDE: 111 mmol/L (ref 101–111)
CO2: 22 mmol/L (ref 22–32)
Calcium: 9.1 mg/dL (ref 8.9–10.3)
Creatinine, Ser: 3.61 mg/dL — ABNORMAL HIGH (ref 0.61–1.24)
GFR calc Af Amer: 19 mL/min — ABNORMAL LOW (ref >60)
GFR, EST NON AFRICAN AMERICAN: 16 mL/min — AB (ref >60)
GLUCOSE: 133 mg/dL — AB (ref 65–99)
POTASSIUM: 4.2 mmol/L (ref 3.5–5.1)
Sodium: 143 mmol/L (ref 135–145)
TOTAL PROTEIN: 7.2 g/dL (ref 6.5–8.1)

## 2017-07-01 LAB — CBC
HCT: 38.9 % — ABNORMAL LOW (ref 39.0–52.0)
Hemoglobin: 12.3 g/dL — ABNORMAL LOW (ref 13.0–17.0)
MCH: 28.8 pg (ref 26.0–34.0)
MCHC: 31.6 g/dL (ref 30.0–36.0)
MCV: 91.1 fL (ref 78.0–100.0)
Platelets: 131 10*3/uL — ABNORMAL LOW (ref 150–400)
RBC: 4.27 MIL/uL (ref 4.22–5.81)
RDW: 14.7 % (ref 11.5–15.5)
WBC: 13.8 10*3/uL — ABNORMAL HIGH (ref 4.0–10.5)

## 2017-07-01 MED ORDER — HEPARIN SODIUM (PORCINE) 5000 UNIT/ML IJ SOLN
5000.0000 [IU] | Freq: Three times a day (TID) | INTRAMUSCULAR | Status: DC
  Administered 2017-07-02 – 2017-07-04 (×7): 5000 [IU] via SUBCUTANEOUS
  Filled 2017-07-01 (×7): qty 1

## 2017-07-01 MED ORDER — SODIUM CHLORIDE 0.9 % IV SOLN
2.0000 g | INTRAVENOUS | Status: DC
  Administered 2017-07-01 – 2017-07-03 (×3): 2 g via INTRAVENOUS
  Filled 2017-07-01 (×4): qty 2

## 2017-07-01 NOTE — Progress Notes (Signed)
PHARMACY - PHYSICIAN COMMUNICATION CRITICAL VALUE ALERT - BLOOD CULTURE IDENTIFICATION (BCID)  Larry [name] is an 67 y.o. male who presented to [facility] on 06/30/2017   Name of physician (or Provider) Contacted: Rai  Current antibiotics: ceftriaxone  Changes to prescribed antibiotics recommended:  Recommendations accepted by provider - change to cefepime  Results for orders placed or performed during the hospital encounter of 06/30/17  Blood Culture ID Panel (Reflexed) (Collected: 06/30/2017 11:28 AM)  Result Value Ref Range   Enterococcus species NOT DETECTED NOT DETECTED   Listeria monocytogenes NOT DETECTED NOT DETECTED   Staphylococcus species NOT DETECTED NOT DETECTED   Staphylococcus aureus NOT DETECTED NOT DETECTED   Streptococcus species NOT DETECTED NOT DETECTED   Streptococcus agalactiae NOT DETECTED NOT DETECTED   Streptococcus pneumoniae NOT DETECTED NOT DETECTED   Streptococcus pyogenes NOT DETECTED NOT DETECTED   Acinetobacter baumannii NOT DETECTED NOT DETECTED   Enterobacteriaceae species NOT DETECTED NOT DETECTED   Enterobacter cloacae complex NOT DETECTED NOT DETECTED   Escherichia coli NOT DETECTED NOT DETECTED   Klebsiella oxytoca NOT DETECTED NOT DETECTED   Klebsiella pneumoniae NOT DETECTED NOT DETECTED   Proteus species NOT DETECTED NOT DETECTED   Serratia marcescens NOT DETECTED NOT DETECTED   Carbapenem resistance NOT DETECTED NOT DETECTED   Haemophilus influenzae NOT DETECTED NOT DETECTED   Neisseria meningitidis NOT DETECTED NOT DETECTED   Pseudomonas aeruginosa DETECTED (A) NOT DETECTED   Candida albicans NOT DETECTED NOT DETECTED   Candida glabrata NOT DETECTED NOT DETECTED   Candida krusei NOT DETECTED NOT DETECTED   Candida parapsilosis NOT DETECTED NOT DETECTED   Candida tropicalis NOT DETECTED NOT DETECTED    Larry [name], [name] 07/01/2017  1:27 PM

## 2017-07-01 NOTE — Progress Notes (Signed)
Urology Inpatient Progress Report  Generalized weakness [R53.1] AKI (acute kidney injury) ([place]) [N17.9] Sepsis ([place]) [A41.9] Urinary tract infection with hematuria, site unspecified [N39.0, R31.9] Sepsis secondary to UTI ([place]) [A41.9, N39.0]  Procedure(s): CYSTOSCOPY WITH RETROGRADE PYELOGRAM/URETERAL STENT PLACEMENT  1 Day Post-Op   Intv/Subj: No acute events overnight. Patient is without complaint. Remains tachycardic.  He however feels much improved and clinically looks much improved on exam.  Principal Problem:   Sepsis due to urinary tract infection ([place]) Active Problems:   Acute kidney injury superimposed on chronic kidney disease III ([place])   Coronary artery disease   Hypertension   OSA on CPAP   COPD (chronic obstructive pulmonary disease) ([facility])   Ectopic atrial tachycardia ([place])   History of colostomy reversal   Hydronephrosis of left kidney with hydroureter  Current Facility-Administered Medications  Medication Dose Route Frequency Provider Last Rate Last Dose  . 0.9 %  sodium chloride infusion   Intravenous Continuous Samella Parr, NP 100 mL/hr at 07/01/17 0843    . 0.9 %  sodium chloride infusion   Intravenous Continuous [name], MD 10 mL/hr at 06/30/17 1809    . acetaminophen (TYLENOL) tablet 650 mg  650 mg Oral Q6H PRN Samella Parr, NP       Or  . acetaminophen (TYLENOL) suppository 650 mg  650 mg Rectal Q6H PRN Samella Parr, NP      . aspirin EC tablet 81 mg  81 mg Oral Daily Samella Parr, NP   81 mg at 07/01/17 1037  . atorvastatin (LIPITOR) tablet 40 mg  40 mg Oral QHS Samella Parr, NP   40 mg at 06/30/17 2227  . cefTRIAXone (ROCEPHIN) 2 g in sodium chloride 0.9 % 100 mL IVPB  2 g Intravenous Q24H Samella Parr, NP      . clopidogrel (PLAVIX) tablet 75 mg  75 mg Oral Daily Samella Parr, NP   75 mg at 07/01/17 1037  . enoxaparin (LOVENOX) injection 40 mg  40 mg Subcutaneous Q24H Samella Parr, NP   40 mg at 07/01/17  1037  . ferrous sulfate tablet 325 mg  325 mg Oral Q breakfast Samella Parr, NP   325 mg at 07/01/17 1036  . levalbuterol (XOPENEX) nebulizer solution 0.63 mg  0.63 mg Inhalation Q6H PRN Samella Parr, NP      . metoprolol tartrate (LOPRESSOR) injection 5 mg  5 mg Intravenous Q4H PRN Samella Parr, NP   1 mg at 06/30/17 1908  . metoprolol tartrate (LOPRESSOR) tablet 37.5 mg  37.5 mg Oral BID Debbe Odea, MD   37.5 mg at 07/01/17 1038  . ondansetron (ZOFRAN) tablet 4 mg  4 mg Oral Q6H PRN Samella Parr, NP       Or  . ondansetron [facility]) injection 4 mg  4 mg Intravenous Q6H PRN Erin Hearing L, NP      . sodium chloride flush (NS) 0.9 % injection 3 mL  3 mL Intravenous Q12H Samella Parr, NP   3 mL at 06/30/17 2228     Objective: Vital: Vitals:   07/01/17 0317 07/01/17 0509 07/01/17 0649 07/01/17 1037  BP: 127/83 123/72 125/82 124/65  Pulse: (!) 118 (!) 120 (!) 118 (!) 132  Resp: 18 18 18    Temp: 97.8 F (36.6 C) 97.9 F (36.6 C) 97.8 F (36.6 C)   TempSrc: Oral Oral    SpO2: 100% 100% 100%   Weight:  (!) 153.3  kg (337 lb 15.4 oz)    Height:       I/Os: I/O last 3 completed shifts: In: 6705 [P.O.:100; I.V.:2555; Other:1050; IV Piggyback:3000] Out: 2805 [Urine:2050; Stool:750; Blood:5]  Physical Exam:  General: Obese male patient is in no apparent distress Lungs: Normal respiratory effort, chest expands symmetrically. GI:The abdomen is soft and nontender without mass. Foley: Draining clear yellow urine Ext: lower extremities symmetric  Lab Results: Recent Labs    06/30/17 1112 07/01/17 0412  WBC 16.3* 13.8*  HGB 12.5* 12.3*  HCT 38.2* 38.9*   Recent Labs    06/30/17 1112 07/01/17 0412  NA 139 143  K 3.8 4.2  CL 107 111  CO2 19* 22  GLUCOSE 108* 133*  BUN 35* 37*  CREATININE 3.60* 3.61*  CALCIUM 9.6 9.1   Recent Labs    06/30/17 1113  INR 1.31   No results for input(s): LABURIN in the last 72 hours. Results for orders placed or  performed during the hospital encounter of 06/30/17  Culture, blood (Routine x 2)     Status: None (Preliminary result)   Collection Time: 06/30/17 11:28 AM  Result Value Ref Range Status   Specimen Description BLOOD RIGHT HAND  Final   Special Requests   Final    BOTTLES DRAWN AEROBIC AND ANAEROBIC Blood Culture adequate volume   Culture  Setup Time   Final    GRAM NEGATIVE RODS AEROBIC BOTTLE ONLY Organism ID to follow Performed at [facility], [address] [place], [place] [address]    Culture GRAM NEGATIVE RODS  Final   Report Status PENDING  Incomplete  Culture, blood (Routine x 2)     Status: None (Preliminary result)   Collection Time: 06/30/17 12:40 PM  Result Value Ref Range Status   Specimen Description BLOOD LEFT HAND  Final   Special Requests   Final    BOTTLES DRAWN AEROBIC AND ANAEROBIC Blood Culture adequate volume   Culture   Final    NO GROWTH < 24 HOURS Performed at [facility], [place] [address] [place], [place] [address]    Report Status PENDING  Incomplete  Urine culture     Status: Abnormal (Preliminary result)   Collection Time: 06/30/17  3:19 PM  Result Value Ref Range Status   Specimen Description URINE, RANDOM  Final   Special Requests   Final    NONE Performed at [facility], [place] [address], [place], [place] [address]    Culture >=100,000 COLONIES/mL GRAM NEGATIVE RODS (A)  Final   Report Status PENDING  Incomplete    Studies/Results: Ct Abdomen Pelvis Wo Contrast  Result Date: 06/30/2017 CLINICAL DATA:  Left abdominal pain. Clinically suspected gastroenteritis or colitis. EXAM: CT ABDOMEN AND PELVIS WITHOUT CONTRAST TECHNIQUE: Multidetector CT imaging of the abdomen and pelvis was performed following the standard protocol without IV contrast. COMPARISON:  04/28/2016. FINDINGS: Lower chest: Minimal pericardial fluid. Atheromatous coronary artery calcifications. Minimal bibasilar atelectasis or scarring. Hepatobiliary: Multiple  tiny gallstones in the gallbladder measuring up to 3 mm in maximum diameter each. No gallbladder wall thickening or pericholecystic fluid. Normal appearing liver. Pancreas: Unremarkable. No pancreatic ductal dilatation or surrounding inflammatory changes. Spleen: Normal in size without focal abnormality. Adrenals/Urinary Tract: Interval diffuse enlargement of the left kidney with mild perinephric and left periureteric soft tissue stranding and ill-defined margins of the kidney. There is also minimal dilatation of the left renal collecting system and ureter to the level of an interval  7 mm calculus in the distal left ureter. There are multiple small interval bladder calculi of similar size or smaller. There are also interval multiple similar-sized or smaller calculi within a right bladder diverticulum. Bilateral renal cysts are noted. Otherwise, unremarkable right kidney and right ureter. Unremarkable adrenal glands. Stomach/Bowel: Multiple sigmoid and descending colon diverticula. Mid transverse colon anastomosis. Right lower quadrant small bowel ostomy. No bowel dilatation. Unremarkable stomach. Vascular/Lymphatic: Atheromatous arterial calcifications without aneurysm. No enlarged lymph nodes. Reproductive: Minimally enlarged prostate gland containing coarse calcifications. Other: Laxity of the midline abdominal wall extending to the skin. This has appearance compatible with an old open anterior abdominal wall surgical wound. Musculoskeletal: Lumbar and lower thoracic spine degenerative changes. IMPRESSION: 1. Interval 7 mm distal left ureteral calculus causing minimal left hydronephrosis and hydroureter. 2. Interval multiple bladder calculi. 3. Interval multiple calculi within a right-sided bladder diverticulum. 4. Sigmoid and descending colon diverticulosis. 5. Cholelithiasis. 6. Atheromatous arterial calcifications, including the coronary arteries. 7. Minimal pericardial effusion. Electronically Signed   By:  Claudie Revering M.D.   On: 06/30/2017 16:43   Dg Chest 2 View  Result Date: 06/30/2017 CLINICAL DATA:  Acute chest pain and shortness of breath for 1 day. EXAM: CHEST - 2 VIEW COMPARISON:  05/26/2016 and prior radiograph FINDINGS: UPPER limits normal heart size noted. Mild bibasilar atelectasis/scarring is unchanged. There is no evidence of focal airspace disease, pulmonary edema, suspicious pulmonary nodule/mass, pleural effusion, or pneumothorax. No acute bony abnormalities are identified. IMPRESSION: No evidence of acute cardiopulmonary disease. Electronically Signed   By: Margarette Canada M.D.   On: 06/30/2017 13:24    Assessment: Left ureteral calculus and bladder calculi Urinary tract infection, concern for sepsis Procedure(s): CYSTOSCOPY WITH RETROGRADE PYELOGRAM/URETERAL STENT PLACEMENT, 1 Day Post-Op  doing well.  Plan: Continue broad-spectrum antibiotics.  Urine culture currently growing gram-negative rods.  Narrow and switch to p.o. once sensitivities available.  I will need to schedule him for an outpatient ureteroscopy with laser lithotripsy and ureteral stent placement on the left side with possible cystolithotripsy.   Link Snuffer, MD Urology 07/01/2017, 11:52 AM

## 2017-07-01 NOTE — Progress Notes (Signed)
Pt unable to tolerate CPAP with nasal mask, or full face mask. Pt placed back on 2L [place], and advised to have family bring his home mask for future use. RT will continue to monitor.

## 2017-07-01 NOTE — Progress Notes (Addendum)
[facility]                                                                              Patient Demographics  Larry [name], is a 67 y.o. male, DOB - 05/05/1950, ZTI:[id number]  Admit date - 06/30/2017   Admitting Physician Debbe Odea, MD  Outpatient Primary MD for the patient is Lin Landsman, MD  Outpatient specialists:   LOS - 1  days   Medical records reviewed and are as summarized below:    Chief Complaint  Patient presents with  . Tachycardia  . Dizziness       Brief summary   Patient is a 67 year old male withectopic atrial tachycardia, three-vessel CAD status post stents to RCA in 8250, grade 1 diastolic dysfunction, stage III CKD, hypertension, sleep apnea on CPAP, history of colon cancer and underwent a colostomy reversal in 5397 that was complicated with postoperative anastomotic leak, respiratory failure requiring tracheostomy and LTAC stay.  He now has a chronic ileostomy.  Patient presented with dizziness, generalized malaise, urinary urgency for several days, low-grade temp of 99.9 F in the ED with suboptimal BP readings, serum lactate elevated at 2.25.  Patient was found to have UTI.  CT abdomen pelvis showed 7 mm distal left ureteral calculus causing minimal left hydronephrosis and hydroureter.   Assessment & Plan    Principal Problem:   Sepsis due to urinary tract infection ([place]) -Patient met sepsis criteria with fever, lactic acidosis, hypotension, UTI -Follow urine cultures, blood cultures -Continue IV Rocephin.  Procalcitonin 21.9 Addendum:  Blood cultures positive for pseudomonas. Changed Abx to cefepime. D/w ID, Dr Linus Salmons, recommended to follow sensitivities, transition to oral antibiotics at dc for 2 weeks. No need of Echo.  - will order repeat blood cultures in am.    Active Problems:   Hydronephrosis of left kidney with hydroureter, nephrolithiasis -CT abdomen showed 7 mm distal left ureteral calculus causing minimal left  hydronephrosis and hydroureter. -Urology was consulted, patient underwent cystoscopy with left retrograde pyelogram, left ureteral stent placement, evacuation/removal of bladder calculi -Continue IV Rocephin, urology following.    Acute kidney injury superimposed on chronic kidney disease III ([place]) -Likely due to #1, #2 -Continue IV fluid hydration, antibiotics, follow BMET, status post left ureteral stent placement -Continue to hold Lasix and Aldactone, nephrotoxic agents    Coronary artery disease -Currently stable, no chest pain, continue aspirin, Plavix, statin    Hypertension -Currently stable, continue beta-blocker    OSA on CPAP    COPD (chronic obstructive pulmonary disease) ([facility]) -Stable, no wheezing  History of chronic diastolic CHF, ischemic cardiomyopathy -Recent echo 2/18 showed EF of 55%  -Given ileostomy with high liquid output, acute kidney injury, holding diuretics -No ACE/ARB secondary to CKD     Ectopic atrial tachycardia ([facility]) -For now continue beta-blocker, monitor BP closely   history of colostomy reversal -History of colon cancer, underwent a colostomy reversal in 6734 that was complicated with postoperative anastomotic leak, now with ileostomy   Code Status: Full code DVT Prophylaxis:  Lovenox Family Communication: Discussed in detail with the patient, all imaging results, lab results explained to the patient  Disposition Plan: Once cleared by urology, follow cultures  Time Spent in minutes 35 minutes  Procedures:  5/10 cystoscopy with left ureteral stent, evacuation of the stone  Consultants:   Urology  Antimicrobials:   IV Rocephin   Medications  Scheduled Meds: . aspirin EC  81 mg Oral Daily  . atorvastatin  40 mg Oral QHS  . clopidogrel  75 mg Oral Daily  . enoxaparin (LOVENOX) injection  40 mg Subcutaneous Q24H  . ferrous sulfate  325 mg Oral Q breakfast  . metoprolol tartrate  37.5 mg Oral BID  . sodium chloride flush  3 mL  Intravenous Q12H   Continuous Infusions: . sodium chloride 100 mL/hr at 07/01/17 0843  . sodium chloride 10 mL/hr at 06/30/17 1809  . cefTRIAXone (ROCEPHIN)  IV     PRN Meds:.acetaminophen **OR** acetaminophen, levalbuterol, metoprolol tartrate, ondansetron **OR** ondansetron (ZOFRAN) IV   Antibiotics   Anti-infectives (From admission, onward)   Start     Dose/Rate Route Frequency Ordered Stop   07/01/17 1530  cefTRIAXone (ROCEPHIN) 2 g in sodium chloride 0.9 % 100 mL IVPB     2 g 200 mL/hr over 30 Minutes Intravenous Every 24 hours 06/30/17 1611     06/30/17 1530  cefTRIAXone (ROCEPHIN) 2 g in sodium chloride 0.9 % 100 mL IVPB     2 g 200 mL/hr over 30 Minutes Intravenous  Once 06/30/17 1518 06/30/17 1601        Subjective:   Wandell Pinkard was seen and examined today.  Feeling slightly better today, denies any significant abdominal pain.  No dizziness, chest pain or shortness of breath. Patient denies dizziness, abdominal pain, N/V/D/C, new weakness, numbess, tingling.   Objective:   Vitals:   07/01/17 0130 07/01/17 0317 07/01/17 0509 07/01/17 0649  BP: 126/73 127/83 123/72 125/82  Pulse: (!) 118 (!) 118 (!) 120 (!) 118  Resp: 18 18 18 18   Temp: 97.7 F (36.5 C) 97.8 F (36.6 C) 97.9 F (36.6 C) 97.8 F (36.6 C)  TempSrc: Oral Oral Oral   SpO2: 99% 100% 100% 100%  Weight:   (!) 153.3 kg (337 lb 15.4 oz)   Height:        Intake/Output Summary (Last 24 hours) at 07/01/2017 0945 Last data filed at 07/01/2017 0842 Gross per 24 hour  Intake 6705 ml  Output 3680 ml  Net 3025 ml     Wt Readings from Last 3 Encounters:  07/01/17 (!) 153.3 kg (337 lb 15.4 oz)  05/18/17 (!) 152.9 kg (337 lb)  05/03/17 (!) 156.9 kg (346 lb)     Exam  General: Alert and oriented x 3, NAD  Eyes:   HEENT:  Atraumatic, normocephalic,  Cardiovascular: S1 S2 auscultated,  Regular rate and rhythm.  Respiratory: Clear to auscultation bilaterally, no wheezing, rales or  rhonchi  Gastrointestinal: Soft, nontender, nondistended, + bowel sounds, + ileostomy  Ext: no pedal edema bilaterally  Neuro: no new deficits  Musculoskeletal: No digital cyanosis, clubbing  Skin: No rashes  Psych: Normal affect and demeanor, alert and oriented x3    Data Reviewed:  I have personally reviewed following labs and imaging studies  Micro Results No results found for this or any previous visit (from the past 240 hour(s)).  Radiology Reports Ct Abdomen Pelvis Wo Contrast  Result Date: 06/30/2017 CLINICAL DATA:  Left abdominal pain. Clinically suspected gastroenteritis or colitis. EXAM: CT ABDOMEN AND PELVIS WITHOUT CONTRAST TECHNIQUE: Multidetector CT imaging of the abdomen and pelvis was performed following  the standard protocol without IV contrast. COMPARISON:  04/28/2016. FINDINGS: Lower chest: Minimal pericardial fluid. Atheromatous coronary artery calcifications. Minimal bibasilar atelectasis or scarring. Hepatobiliary: Multiple tiny gallstones in the gallbladder measuring up to 3 mm in maximum diameter each. No gallbladder wall thickening or pericholecystic fluid. Normal appearing liver. Pancreas: Unremarkable. No pancreatic ductal dilatation or surrounding inflammatory changes. Spleen: Normal in size without focal abnormality. Adrenals/Urinary Tract: Interval diffuse enlargement of the left kidney with mild perinephric and left periureteric soft tissue stranding and ill-defined margins of the kidney. There is also minimal dilatation of the left renal collecting system and ureter to the level of an interval 7 mm calculus in the distal left ureter. There are multiple small interval bladder calculi of similar size or smaller. There are also interval multiple similar-sized or smaller calculi within a right bladder diverticulum. Bilateral renal cysts are noted. Otherwise, unremarkable right kidney and right ureter. Unremarkable adrenal glands. Stomach/Bowel: Multiple sigmoid and  descending colon diverticula. Mid transverse colon anastomosis. Right lower quadrant small bowel ostomy. No bowel dilatation. Unremarkable stomach. Vascular/Lymphatic: Atheromatous arterial calcifications without aneurysm. No enlarged lymph nodes. Reproductive: Minimally enlarged prostate gland containing coarse calcifications. Other: Laxity of the midline abdominal wall extending to the skin. This has appearance compatible with an old open anterior abdominal wall surgical wound. Musculoskeletal: Lumbar and lower thoracic spine degenerative changes. IMPRESSION: 1. Interval 7 mm distal left ureteral calculus causing minimal left hydronephrosis and hydroureter. 2. Interval multiple bladder calculi. 3. Interval multiple calculi within a right-sided bladder diverticulum. 4. Sigmoid and descending colon diverticulosis. 5. Cholelithiasis. 6. Atheromatous arterial calcifications, including the coronary arteries. 7. Minimal pericardial effusion. Electronically Signed   By: Claudie Revering M.D.   On: 06/30/2017 16:43   Dg Chest 2 View  Result Date: 06/30/2017 CLINICAL DATA:  Acute chest pain and shortness of breath for 1 day. EXAM: CHEST - 2 VIEW COMPARISON:  05/26/2016 and prior radiograph FINDINGS: UPPER limits normal heart size noted. Mild bibasilar atelectasis/scarring is unchanged. There is no evidence of focal airspace disease, pulmonary edema, suspicious pulmonary nodule/mass, pleural effusion, or pneumothorax. No acute bony abnormalities are identified. IMPRESSION: No evidence of acute cardiopulmonary disease. Electronically Signed   By: Margarette Canada M.D.   On: 06/30/2017 13:24    Lab Data:  CBC: Recent Labs  Lab 06/30/17 1112 07/01/17 0412  WBC 16.3* 13.8*  HGB 12.5* 12.3*  HCT 38.2* 38.9*  MCV 89.3 91.1  PLT 140* 680*   Basic Metabolic Panel: Recent Labs  Lab 06/30/17 1112 07/01/17 0412  NA 139 143  K 3.8 4.2  CL 107 111  CO2 19* 22  GLUCOSE 108* 133*  BUN 35* 37*  CREATININE 3.60* 3.61*    CALCIUM 9.6 9.1   GFR: Estimated Creatinine Clearance: 31.1 mL/min (A) (by C-G formula based on SCr of 3.61 mg/dL (H)). Liver Function Tests: Recent Labs  Lab 07/01/17 0412  AST 19  ALT 20  ALKPHOS 100  BILITOT 1.7*  PROT 7.2  ALBUMIN 2.9*   No results for input(s): LIPASE, AMYLASE in the last 168 hours. No results for input(s): AMMONIA in the last 168 hours. Coagulation Profile: Recent Labs  Lab 06/30/17 1113  INR 1.31   Cardiac Enzymes: No results for input(s): CKTOTAL, CKMB, CKMBINDEX, TROPONINI in the last 168 hours. BNP (last 3 results) Recent Labs    02/22/17 0926 05/03/17 1021  PROBNP 87 126   HbA1C: No results for input(s): HGBA1C in the last 72 hours. CBG: Recent Labs  Lab 06/30/17  [place]   Lipid Profile: No results for input(s): CHOL, HDL, LDLCALC, TRIG, CHOLHDL, LDLDIRECT in the last 72 hours. Thyroid Function Tests: No results for input(s): TSH, T4TOTAL, FREET4, T3FREE, THYROIDAB in the last 72 hours. Anemia Panel: No results for input(s): VITAMINB12, FOLATE, FERRITIN, TIBC, IRON, RETICCTPCT in the last 72 hours. Urine analysis:    Component Value Date/Time   COLORURINE AMBER (A) 06/30/2017 1445   APPEARANCEUR CLOUDY (A) 06/30/2017 1445   LABSPEC 1.014 06/30/2017 1445   PHURINE 5.0 06/30/2017 1445   GLUCOSEU NEGATIVE 06/30/2017 1445   HGBUR LARGE (A) 06/30/2017 1445   BILIRUBINUR NEGATIVE 06/30/2017 1445   KETONESUR NEGATIVE 06/30/2017 1445   PROTEINUR 100 (A) 06/30/2017 1445   UROBILINOGEN 0.2 03/04/2013 1020   NITRITE NEGATIVE 06/30/2017 1445   LEUKOCYTESUR MODERATE (A) 06/30/2017 1445     Merik Mignano M.D. [facility] 07/01/2017, 9:45 AM  Pager: [phone] Between 7am to 7pm - call Pager - [phone]  After 7pm go to [email or web address] - password [email or web address]  Call night coverage person covering after 7pm

## 2017-07-01 NOTE — Progress Notes (Signed)
Ileostomy bag and dressing was completely changed. Wife brought the supplies from home. [place] [phone]  4mm 2-3/4, 28mm 2- 1/4; Lot [id number].

## 2017-07-02 LAB — BASIC METABOLIC PANEL
Anion gap: 11 (ref 5–15)
BUN: 44 mg/dL — ABNORMAL HIGH (ref 6–20)
CALCIUM: 9.2 mg/dL (ref 8.9–10.3)
CHLORIDE: 112 mmol/L — AB (ref 101–111)
CO2: 18 mmol/L — AB (ref 22–32)
CREATININE: 2.82 mg/dL — AB (ref 0.61–1.24)
GFR calc Af Amer: 26 mL/min — ABNORMAL LOW (ref >60)
GFR calc non Af Amer: 22 mL/min — ABNORMAL LOW (ref >60)
GLUCOSE: 121 mg/dL — AB (ref 65–99)
Potassium: 4.5 mmol/L (ref 3.5–5.1)
Sodium: 141 mmol/L (ref 135–145)

## 2017-07-02 LAB — URINE CULTURE: Culture: >=100000 — AB

## 2017-07-02 LAB — CBC
HEMATOCRIT: 36.3 % — AB (ref 39.0–52.0)
HEMOGLOBIN: 11.3 g/dL — AB (ref 13.0–17.0)
MCH: 28.3 pg (ref 26.0–34.0)
MCHC: 31.1 g/dL (ref 30.0–36.0)
MCV: 91 fL (ref 78.0–100.0)
Platelets: 159 10*3/uL (ref 150–400)
RBC: 3.99 MIL/uL — ABNORMAL LOW (ref 4.22–5.81)
RDW: 14.4 % (ref 11.5–15.5)
WBC: 11.9 10*3/uL — ABNORMAL HIGH (ref 4.0–10.5)

## 2017-07-02 MED ORDER — SIMETHICONE 80 MG PO CHEW
125.0000 mg | CHEWABLE_TABLET | Freq: Three times a day (TID) | ORAL | Status: DC
  Administered 2017-07-02 – 2017-07-04 (×6): 120 mg via ORAL
  Filled 2017-07-02 (×6): qty 2

## 2017-07-02 MED ORDER — PNEUMOCOCCAL VAC POLYVALENT 25 MCG/0.5ML IJ INJ
0.5000 mL | INJECTION | INTRAMUSCULAR | Status: AC
  Administered 2017-07-03: 0.5 mL via INTRAMUSCULAR
  Filled 2017-07-02: qty 0.5

## 2017-07-02 NOTE — Progress Notes (Signed)
Urology Inpatient Progress Report  Generalized weakness [R53.1] AKI (acute kidney injury) ([place]) [N17.9] Sepsis ([place]) [A41.9] Urinary tract infection with hematuria, site unspecified [N39.0, R31.9] Sepsis secondary to UTI ([place]) [A41.9, N39.0]  Procedure(s): CYSTOSCOPY WITH RETROGRADE PYELOGRAM/URETERAL STENT PLACEMENT  2 Days Post-Op   Intv/Subj: No acute events overnight. Patient is without complaint.  He is feeling well.  He has been afebrile.  Remains tachycardic.  He is on cefepime.  Urine culture grew Pseudomonas.  Sensitivities below.  Blood culture also positive for Pseudomonas.  He has no complaints today.  Principal Problem:   Sepsis due to urinary tract infection ([place]) Active Problems:   Acute kidney injury superimposed on chronic kidney disease III ([place])   Coronary artery disease   Hypertension   OSA on CPAP   COPD (chronic obstructive pulmonary disease) ([facility])   Ectopic atrial tachycardia ([facility])   History of colostomy reversal   Hydronephrosis of left kidney with hydroureter  Current Facility-Administered Medications  Medication Dose Route Frequency Provider Last Rate Last Dose  . 0.9 %  sodium chloride infusion   Intravenous Continuous Samella Parr, NP 100 mL/hr at 07/02/17 1447    . 0.9 %  sodium chloride infusion   Intravenous Continuous [name], MD 10 mL/hr at 06/30/17 1809    . acetaminophen (TYLENOL) tablet 650 mg  650 mg Oral Q6H PRN Samella Parr, NP       Or  . acetaminophen (TYLENOL) suppository 650 mg  650 mg Rectal Q6H PRN Samella Parr, NP      . aspirin EC tablet 81 mg  81 mg Oral Daily Samella Parr, NP   81 mg at 07/02/17 0933  . atorvastatin (LIPITOR) tablet 40 mg  40 mg Oral QHS Samella Parr, NP   40 mg at 07/01/17 2208  . ceFEPIme (MAXIPIME) 2 g in sodium chloride 0.9 % 100 mL IVPB  2 g Intravenous Q24H Rai, Ripudeep K, MD   Stopped at 07/02/17 1519  . clopidogrel (PLAVIX) tablet 75 mg  75 mg Oral Daily Samella Parr, NP   75 mg at 07/02/17 0934  . ferrous sulfate tablet 325 mg  325 mg Oral Q breakfast Samella Parr, NP   325 mg at 07/02/17 0934  . heparin injection 5,000 Units  5,000 Units Subcutaneous Q8H Rai, Ripudeep K, MD   5,000 Units at 07/02/17 1443  . levalbuterol (XOPENEX) nebulizer solution 0.63 mg  0.63 mg Inhalation Q6H PRN Samella Parr, NP      . metoprolol tartrate (LOPRESSOR) injection 5 mg  5 mg Intravenous Q4H PRN Samella Parr, NP   1 mg at 06/30/17 1908  . metoprolol tartrate (LOPRESSOR) tablet 37.5 mg  37.5 mg Oral BID Debbe Odea, MD   37.5 mg at 07/02/17 0934  . ondansetron (ZOFRAN) tablet 4 mg  4 mg Oral Q6H PRN Samella Parr, NP       Or  . ondansetron [facility]) injection 4 mg  4 mg Intravenous Q6H PRN Samella Parr, NP      . sodium chloride flush (NS) 0.9 % injection 3 mL  3 mL Intravenous Q12H Samella Parr, NP   3 mL at 07/01/17 2208     Objective: Vital: Vitals:   07/01/17 2052 07/01/17 2239 07/02/17 0522 07/02/17 0934  BP: 121/76  (!) 128/92 132/85  Pulse: (!) 123  (!) 122 (!) 121  Resp: 17 18 18    Temp: 97.7 F (36.5 C)  98.3 F (36.8 C)   TempSrc: Oral  Oral   SpO2: 100%  96%   Weight:   (!) 155.9 kg (343 lb 11.2 oz)   Height:       I/Os: I/O last 3 completed shifts: In: 3708 [P.O.:100; I.V.:2558; Other:1050] Out: 6480 [Urine:3225; Other:500; Stool:2750; Blood:5]  Physical Exam:  General: Patient is in no apparent distress Lungs: Normal respiratory effort, chest expands symmetrically. GI: The abdomen is soft and nontender without mass.  Morbidly obese Foley: In place draining light red urine Ext: lower extremities symmetric  Lab Results: Recent Labs    06/30/17 1112 07/01/17 0412 07/02/17 0407  WBC 16.3* 13.8* 11.9*  HGB 12.5* 12.3* 11.3*  HCT 38.2* 38.9* 36.3*   Recent Labs    06/30/17 1112 07/01/17 0412 07/02/17 0407  NA 139 143 141  K 3.8 4.2 4.5  CL 107 111 112*  CO2 19* 22 18*  GLUCOSE 108* 133* 121*  BUN 35*  37* 44*  CREATININE 3.60* 3.61* 2.82*  CALCIUM 9.6 9.1 9.2   Recent Labs    06/30/17 1113  INR 1.31   No results for input(s): LABURIN in the last 72 hours. Results for orders placed or performed during the hospital encounter of 06/30/17  Culture, blood (Routine x 2)     Status: Abnormal (Preliminary result)   Collection Time: 06/30/17 11:28 AM  Result Value Ref Range Status   Specimen Description BLOOD RIGHT HAND  Final   Special Requests   Final    BOTTLES DRAWN AEROBIC AND ANAEROBIC Blood Culture adequate volume   Culture  Setup Time   Final    GRAM NEGATIVE RODS IN BOTH AEROBIC AND ANAEROBIC BOTTLES CRITICAL RESULT CALLED TO, READ BACK BY AND VERIFIED WITH: R RUMBARGER,PAHRMD AT 1318 07/01/17 BY L BENFIELD    Culture (A)  Final    PSEUDOMONAS AERUGINOSA SUSCEPTIBILITIES TO FOLLOW Performed at [facility], [place] [address] [place], [place] [address]    Report Status PENDING  Incomplete  Blood Culture ID Panel (Reflexed)     Status: Abnormal   Collection Time: 06/30/17 11:28 AM  Result Value Ref Range Status   Enterococcus species NOT DETECTED NOT DETECTED Final   Listeria monocytogenes NOT DETECTED NOT DETECTED Final   Staphylococcus species NOT DETECTED NOT DETECTED Final   Staphylococcus aureus NOT DETECTED NOT DETECTED Final   Streptococcus species NOT DETECTED NOT DETECTED Final   Streptococcus agalactiae NOT DETECTED NOT DETECTED Final   Streptococcus pneumoniae NOT DETECTED NOT DETECTED Final   Streptococcus pyogenes NOT DETECTED NOT DETECTED Final   Acinetobacter baumannii NOT DETECTED NOT DETECTED Final   Enterobacteriaceae species NOT DETECTED NOT DETECTED Final   Enterobacter cloacae complex NOT DETECTED NOT DETECTED Final   Escherichia coli NOT DETECTED NOT DETECTED Final   Klebsiella oxytoca NOT DETECTED NOT DETECTED Final   Klebsiella pneumoniae NOT DETECTED NOT DETECTED Final   Proteus species NOT DETECTED NOT DETECTED Final   Serratia marcescens  NOT DETECTED NOT DETECTED Final   Carbapenem resistance NOT DETECTED NOT DETECTED Final   Haemophilus influenzae NOT DETECTED NOT DETECTED Final   Neisseria meningitidis NOT DETECTED NOT DETECTED Final   Pseudomonas aeruginosa DETECTED (A) NOT DETECTED Final    Comment: CRITICAL RESULT CALLED TO, READ BACK BY AND VERIFIED WITH: R RUMBARGER,PHARMD AT 1318 07/01/17 BY L BENFIELD    Candida albicans NOT DETECTED NOT DETECTED Final   Candida glabrata NOT DETECTED NOT DETECTED Final   Candida krusei NOT DETECTED NOT DETECTED  Final   Candida parapsilosis NOT DETECTED NOT DETECTED Final   Candida tropicalis NOT DETECTED NOT DETECTED Final    Comment: Performed at [facility], [place] [address], [place], [place] [address]  Culture, blood (Routine x 2)     Status: None (Preliminary result)   Collection Time: 06/30/17 12:40 PM  Result Value Ref Range Status   Specimen Description BLOOD LEFT HAND  Final   Special Requests   Final    BOTTLES DRAWN AEROBIC AND ANAEROBIC Blood Culture adequate volume   Culture   Final    NO GROWTH 2 DAYS Performed at [facility], [place] [address], [place], [place] [address]    Report Status PENDING  Incomplete  Urine culture     Status: Abnormal   Collection Time: 06/30/17  3:19 PM  Result Value Ref Range Status   Specimen Description URINE, RANDOM  Final   Special Requests   Final    NONE Performed at [facility], [place] [address] [place], [place] [address]    Culture >=100,000 COLONIES/mL PSEUDOMONAS AERUGINOSA (A)  Final   Report Status 07/02/2017 FINAL  Final   Organism ID, Bacteria PSEUDOMONAS AERUGINOSA (A)  Final      Susceptibility   Pseudomonas aeruginosa - MIC*    CEFTAZIDIME 2 SENSITIVE Sensitive     CIPROFLOXACIN <=0.25 SENSITIVE Sensitive     GENTAMICIN <=1 SENSITIVE Sensitive     IMIPENEM 2 SENSITIVE Sensitive     PIP/TAZO <=4 SENSITIVE Sensitive     CEFEPIME <=1 SENSITIVE Sensitive     * >=100,000 COLONIES/mL PSEUDOMONAS  AERUGINOSA    Studies/Results: Ct Abdomen Pelvis Wo Contrast  Result Date: 06/30/2017 CLINICAL DATA:  Left abdominal pain. Clinically suspected gastroenteritis or colitis. EXAM: CT ABDOMEN AND PELVIS WITHOUT CONTRAST TECHNIQUE: Multidetector CT imaging of the abdomen and pelvis was performed following the standard protocol without IV contrast. COMPARISON:  04/28/2016. FINDINGS: Lower chest: Minimal pericardial fluid. Atheromatous coronary artery calcifications. Minimal bibasilar atelectasis or scarring. Hepatobiliary: Multiple tiny gallstones in the gallbladder measuring up to 3 mm in maximum diameter each. No gallbladder wall thickening or pericholecystic fluid. Normal appearing liver. Pancreas: Unremarkable. No pancreatic ductal dilatation or surrounding inflammatory changes. Spleen: Normal in size without focal abnormality. Adrenals/Urinary Tract: Interval diffuse enlargement of the left kidney with mild perinephric and left periureteric soft tissue stranding and ill-defined margins of the kidney. There is also minimal dilatation of the left renal collecting system and ureter to the level of an interval 7 mm calculus in the distal left ureter. There are multiple small interval bladder calculi of similar size or smaller. There are also interval multiple similar-sized or smaller calculi within a right bladder diverticulum. Bilateral renal cysts are noted. Otherwise, unremarkable right kidney and right ureter. Unremarkable adrenal glands. Stomach/Bowel: Multiple sigmoid and descending colon diverticula. Mid transverse colon anastomosis. Right lower quadrant small bowel ostomy. No bowel dilatation. Unremarkable stomach. Vascular/Lymphatic: Atheromatous arterial calcifications without aneurysm. No enlarged lymph nodes. Reproductive: Minimally enlarged prostate gland containing coarse calcifications. Other: Laxity of the midline abdominal wall extending to the skin. This has appearance compatible with an old open  anterior abdominal wall surgical wound. Musculoskeletal: Lumbar and lower thoracic spine degenerative changes. IMPRESSION: 1. Interval 7 mm distal left ureteral calculus causing minimal left hydronephrosis and hydroureter. 2. Interval multiple bladder calculi. 3. Interval multiple calculi within a right-sided bladder diverticulum. 4. Sigmoid and descending colon diverticulosis. 5. Cholelithiasis. 6. Atheromatous arterial calcifications, including the coronary arteries. 7. Minimal  pericardial effusion. Electronically Signed   By: Claudie Revering M.D.   On: 06/30/2017 16:43    Assessment: Left ureteral calculus and bladder calculi Bladder diverticulum Bacteremia secondary to the above, urinary tract infection  Procedure(s): CYSTOSCOPY WITH RETROGRADE PYELOGRAM/URETERAL STENT PLACEMENT, 2 Days Post-Op  doing well.  Plan: Continue antibiotics.  His Foley catheter can be removed once he is mobile, which I would hope would be tomorrow morning.  He will need definitive management of his stone in 2 weeks or so after adequate treatment of infection.   Link Snuffer, MD Urology 07/02/2017, 3:01 PM

## 2017-07-02 NOTE — Progress Notes (Signed)
  Placed patient on BIPAP for HS via nasal prongs, previous settings (auto 20/10) with 2 lpm O2 bleed in. Tolerating well at this time.

## 2017-07-02 NOTE — Progress Notes (Signed)
[facility]                                                                              Patient Demographics  Larry [name], is a 67 y.o. male, DOB - 1950/08/04, TJL:[id number]  Admit date - 06/30/2017   Admitting Physician Debbe Odea, MD  Outpatient Primary MD for the patient is Lin Landsman, MD  Outpatient specialists:   LOS - 2  days   Medical records reviewed and are as summarized below:    Chief Complaint  Patient presents with  . Tachycardia  . Dizziness       Brief summary   Patient is a 67 year old male withectopic atrial tachycardia, three-vessel CAD status post stents to RCA in 0158, grade 1 diastolic dysfunction, stage III CKD, hypertension, sleep apnea on CPAP, history of colon cancer and underwent a colostomy reversal in 6825 that was complicated with postoperative anastomotic leak, respiratory failure requiring tracheostomy and LTAC stay.  He now has a chronic ileostomy.  Patient presented with dizziness, generalized malaise, urinary urgency for several days, low-grade temp of 99.9 F in the ED with suboptimal BP readings, serum lactate elevated at 2.25.  Patient was found to have UTI.  CT abdomen pelvis showed 7 mm distal left ureteral calculus causing minimal left hydronephrosis and hydroureter.   Assessment & Plan    Principal Problem:   Sepsis due to urinary tract infection ([place]), pseudomonas bacteremia -Patient met sepsis criteria with fever, lactic acidosis, hypotension, UTI -Cultures, blood cultures positive for Pseudomonas -Change antibiotics to IV cefepime on 5/11, follow procalcitonin in a.m. -Repeat blood cultures.  - D/w ID, Dr Linus Salmons on 5/11, recommended to follow sensitivities, transition to oral antibiotics at dc for 2 weeks. No need of Echo.     Active Problems:   Hydronephrosis of left kidney with hydroureter, nephrolithiasis -CT abdomen showed 7 mm distal left ureteral calculus causing minimal left hydronephrosis and  hydroureter. -patient underwent cystoscopy with left retrograde pyelogram, left ureteral stent placement, evacuation/removal of bladder calculi -urology following, continue IV cefepime -Reported mild hematuria in a.m., follow urology recommendations, change to SCDs    Acute kidney injury superimposed on chronic kidney disease III ([place]) -Likely due to #1, #2 -Continue IV fluid hydration, antibiotics, follow BMET, status post left ureteral stent placement -Creatinine improving, continue to hold Lasix, Aldactone     Coronary artery disease -Currently stable, no chest pain, continue aspirin, Plavix, statin    Hypertension -Currently stable, continue beta-blocker    OSA on CPAP    COPD (chronic obstructive pulmonary disease) (HCC) -Stable, no wheezing  History of chronic diastolic CHF, ischemic cardiomyopathy -Recent echo 2/18 showed EF of 55%  -Given ileostomy with high liquid output, acute kidney injury, holding diuretics -No ACE/ARB secondary to CKD     Ectopic atrial tachycardia (HCC) -BP currently stable   history of colostomy reversal -History of colon cancer, underwent a colostomy reversal in 7493 that was complicated with postoperative anastomotic leak, now with ileostomy   Code Status: Full code DVT Prophylaxis: Change to SCDs, patient reported mild hematuria in a.m. Family Communication: Discussed in detail with the patient, all imaging results, lab  results explained to the patient    Disposition Plan: Follow blood cultures for the sensitivities Time Spent in minutes 25 minutes  Procedures:  5/10 cystoscopy with left ureteral stent, evacuation of the stone  Consultants:   Urology  Antimicrobials:   IV Rocephin   Medications  Scheduled Meds: . aspirin EC  81 mg Oral Daily  . atorvastatin  40 mg Oral QHS  . clopidogrel  75 mg Oral Daily  . ferrous sulfate  325 mg Oral Q breakfast  . heparin injection (subcutaneous)  5,000 Units Subcutaneous Q8H  .  metoprolol tartrate  37.5 mg Oral BID  . sodium chloride flush  3 mL Intravenous Q12H   Continuous Infusions: . sodium chloride 100 mL/hr at 07/01/17 2019  . sodium chloride 10 mL/hr at 06/30/17 1809  . ceFEPime (MAXIPIME) IV Stopped (07/01/17 1716)   PRN Meds:.acetaminophen **OR** acetaminophen, levalbuterol, metoprolol tartrate, ondansetron **OR** ondansetron (ZOFRAN) IV   Antibiotics   Anti-infectives (From admission, onward)   Start     Dose/Rate Route Frequency Ordered Stop   07/01/17 1530  cefTRIAXone (ROCEPHIN) 2 g in sodium chloride 0.9 % 100 mL IVPB  Status:  Discontinued     2 g 200 mL/hr over 30 Minutes Intravenous Every 24 hours 06/30/17 1611 07/01/17 1334   07/01/17 1430  ceFEPIme (MAXIPIME) 2 g in sodium chloride 0.9 % 100 mL IVPB     2 g 200 mL/hr over 30 Minutes Intravenous Every 24 hours 07/01/17 1335     06/30/17 1530  cefTRIAXone (ROCEPHIN) 2 g in sodium chloride 0.9 % 100 mL IVPB     2 g 200 mL/hr over 30 Minutes Intravenous  Once 06/30/17 1518 06/30/17 1601        Subjective:   [name] was seen and examined today.  Denies any specific complaints this morning.  No fevers.  Stated that he had some hematuria early this morning.  Patient denies dizziness, abdominal pain, N/V/D/C, new weakness, numbess, tingling.   Objective:   Vitals:   07/01/17 2052 07/01/17 2239 07/02/17 0522 07/02/17 0934  BP: 121/76  (!) 128/92 132/85  Pulse: (!) 123  (!) 122 (!) 121  Resp: _0 Temp: 97.7 F (36.5 C)  98.3 F (36.8 C)   TempSrc: Oral  Oral   SpO2: 100%  96%   Weight:   (!) 155.9 kg (343 lb 11.2 oz)   Height:        Intake/Output Summary (Last 24 hours) at 07/02/2017 1147 Last data filed at 07/02/2017 0936 Gross per 24 hour  Intake 1003 ml  Output 4250 ml  Net -3247 ml     Wt Readings from Last 3 Encounters:  07/02/17 (!) 155.9 kg (343 lb 11.2 oz)  05/18/17 (!) 152.9 kg (337 lb)  05/03/17 (!) 156.9 kg (346 lb)     Exam    General:  Alert and oriented x 3, NAD  Eyes:   HEENT:  Atraumatic, normocephalic  Cardiovascular: S1 S2 auscultated, Regular rate and rhythm. No pedal edema b/l  Respiratory: Clear to auscultation bilaterally, no wheezing, rales or rhonchi  Gastrointestinal: Soft, nontender, nondistended, + bowel sounds, ileostomy  Ext: no pedal edema bilaterally  Neuro: no new deficits  Musculoskeletal: No digital cyanosis, clubbing  Skin: No rashes  Psych: Normal affect and demeanor, alert and oriented x3        GU: Foley with dark orange urine, no clots  Data Reviewed:  I have personally reviewed following labs and imaging  studies  Micro Results Recent Results (from the past 240 hour(s))  Culture, blood (Routine x 2)     Status: Abnormal (Preliminary result)   Collection Time: 06/30/17 11:28 AM  Result Value Ref Range Status   Specimen Description BLOOD RIGHT HAND  Final   Special Requests   Final    BOTTLES DRAWN AEROBIC AND ANAEROBIC Blood Culture adequate volume   Culture  Setup Time   Final    GRAM NEGATIVE RODS IN BOTH AEROBIC AND ANAEROBIC BOTTLES CRITICAL RESULT CALLED TO, READ BACK BY AND VERIFIED WITH: R RUMBARGER,PAHRMD AT 1318 07/01/17 BY L BENFIELD    Culture (A)  Final    PSEUDOMONAS AERUGINOSA SUSCEPTIBILITIES TO FOLLOW Performed at [facility], [address], [place], [place] [address]    Report Status PENDING  Incomplete  Blood Culture ID Panel (Reflexed)     Status: Abnormal   Collection Time: 06/30/17 11:28 AM  Result Value Ref Range Status   Enterococcus species NOT DETECTED NOT DETECTED Final   Listeria monocytogenes NOT DETECTED NOT DETECTED Final   Staphylococcus species NOT DETECTED NOT DETECTED Final   Staphylococcus aureus NOT DETECTED NOT DETECTED Final   Streptococcus species NOT DETECTED NOT DETECTED Final   Streptococcus agalactiae NOT DETECTED NOT DETECTED Final   Streptococcus pneumoniae NOT DETECTED NOT DETECTED Final   Streptococcus pyogenes NOT  DETECTED NOT DETECTED Final   Acinetobacter baumannii NOT DETECTED NOT DETECTED Final   Enterobacteriaceae species NOT DETECTED NOT DETECTED Final   Enterobacter cloacae complex NOT DETECTED NOT DETECTED Final   Escherichia coli NOT DETECTED NOT DETECTED Final   Klebsiella oxytoca NOT DETECTED NOT DETECTED Final   Klebsiella pneumoniae NOT DETECTED NOT DETECTED Final   Proteus species NOT DETECTED NOT DETECTED Final   Serratia marcescens NOT DETECTED NOT DETECTED Final   Carbapenem resistance NOT DETECTED NOT DETECTED Final   Haemophilus influenzae NOT DETECTED NOT DETECTED Final   Neisseria meningitidis NOT DETECTED NOT DETECTED Final   Pseudomonas aeruginosa DETECTED (A) NOT DETECTED Final    Comment: CRITICAL RESULT CALLED TO, READ BACK BY AND VERIFIED WITH: R RUMBARGER,PHARMD AT 1318 07/01/17 BY L BENFIELD    Candida albicans NOT DETECTED NOT DETECTED Final   Candida glabrata NOT DETECTED NOT DETECTED Final   Candida krusei NOT DETECTED NOT DETECTED Final   Candida parapsilosis NOT DETECTED NOT DETECTED Final   Candida tropicalis NOT DETECTED NOT DETECTED Final    Comment: Performed at [facility], [address] [place], [place] [address]  Culture, blood (Routine x 2)     Status: None (Preliminary result)   Collection Time: 06/30/17 12:40 PM  Result Value Ref Range Status   Specimen Description BLOOD LEFT HAND  Final   Special Requests   Final    BOTTLES DRAWN AEROBIC AND ANAEROBIC Blood Culture adequate volume   Culture   Final    NO GROWTH 2 DAYS Performed at [facility], [place] [address], [place], [place] [address]    Report Status PENDING  Incomplete  Urine culture     Status: Abnormal   Collection Time: 06/30/17  3:19 PM  Result Value Ref Range Status   Specimen Description URINE, RANDOM  Final   Special Requests   Final    NONE Performed at [facility], [place] [address], [place], [place] [address]    Culture >=100,000 COLONIES/mL PSEUDOMONAS  AERUGINOSA (A)  Final   Report Status 07/02/2017 FINAL  Final   Organism  ID, Bacteria PSEUDOMONAS AERUGINOSA (A)  Final      Susceptibility   Pseudomonas aeruginosa - MIC*    CEFTAZIDIME 2 SENSITIVE Sensitive     CIPROFLOXACIN <=0.25 SENSITIVE Sensitive     GENTAMICIN <=1 SENSITIVE Sensitive     IMIPENEM 2 SENSITIVE Sensitive     PIP/TAZO <=4 SENSITIVE Sensitive     CEFEPIME <=1 SENSITIVE Sensitive     * >=100,000 COLONIES/mL PSEUDOMONAS AERUGINOSA    Radiology Reports Ct Abdomen Pelvis Wo Contrast  Result Date: 06/30/2017 CLINICAL DATA:  Left abdominal pain. Clinically suspected gastroenteritis or colitis. EXAM: CT ABDOMEN AND PELVIS WITHOUT CONTRAST TECHNIQUE: Multidetector CT imaging of the abdomen and pelvis was performed following the standard protocol without IV contrast. COMPARISON:  04/28/2016. FINDINGS: Lower chest: Minimal pericardial fluid. Atheromatous coronary artery calcifications. Minimal bibasilar atelectasis or scarring. Hepatobiliary: Multiple tiny gallstones in the gallbladder measuring up to 3 mm in maximum diameter each. No gallbladder wall thickening or pericholecystic fluid. Normal appearing liver. Pancreas: Unremarkable. No pancreatic ductal dilatation or surrounding inflammatory changes. Spleen: Normal in size without focal abnormality. Adrenals/Urinary Tract: Interval diffuse enlargement of the left kidney with mild perinephric and left periureteric soft tissue stranding and ill-defined margins of the kidney. There is also minimal dilatation of the left renal collecting system and ureter to the level of an interval 7 mm calculus in the distal left ureter. There are multiple small interval bladder calculi of similar size or smaller. There are also interval multiple similar-sized or smaller calculi within a right bladder diverticulum. Bilateral renal cysts are noted. Otherwise, unremarkable right kidney and right ureter. Unremarkable adrenal glands. Stomach/Bowel: Multiple  sigmoid and descending colon diverticula. Mid transverse colon anastomosis. Right lower quadrant small bowel ostomy. No bowel dilatation. Unremarkable stomach. Vascular/Lymphatic: Atheromatous arterial calcifications without aneurysm. No enlarged lymph nodes. Reproductive: Minimally enlarged prostate gland containing coarse calcifications. Other: Laxity of the midline abdominal wall extending to the skin. This has appearance compatible with an old open anterior abdominal wall surgical wound. Musculoskeletal: Lumbar and lower thoracic spine degenerative changes. IMPRESSION: 1. Interval 7 mm distal left ureteral calculus causing minimal left hydronephrosis and hydroureter. 2. Interval multiple bladder calculi. 3. Interval multiple calculi within a right-sided bladder diverticulum. 4. Sigmoid and descending colon diverticulosis. 5. Cholelithiasis. 6. Atheromatous arterial calcifications, including the coronary arteries. 7. Minimal pericardial effusion. Electronically Signed   By: Claudie Revering M.D.   On: 06/30/2017 16:43   Dg Chest 2 View  Result Date: 06/30/2017 CLINICAL DATA:  Acute chest pain and shortness of breath for 1 day. EXAM: CHEST - 2 VIEW COMPARISON:  05/26/2016 and prior radiograph FINDINGS: UPPER limits normal heart size noted. Mild bibasilar atelectasis/scarring is unchanged. There is no evidence of focal airspace disease, pulmonary edema, suspicious pulmonary nodule/mass, pleural effusion, or pneumothorax. No acute bony abnormalities are identified. IMPRESSION: No evidence of acute cardiopulmonary disease. Electronically Signed   By: Margarette Canada M.D.   On: 06/30/2017 13:24    Lab Data:  CBC: Recent Labs  Lab 06/30/17 1112 07/01/17 0412 07/02/17 0407  WBC 16.3* 13.8* 11.9*  HGB 12.5* 12.3* 11.3*  HCT 38.2* 38.9* 36.3*  MCV 89.3 91.1 91.0  PLT 140* 131* 295   Basic Metabolic Panel: Recent Labs  Lab 06/30/17 1112 07/01/17 0412 07/02/17 0407  NA 139 143 141  K 3.8 4.2 4.5  CL 107  111 112*  CO2 19* 22 18*  GLUCOSE 108* 133* 121*  BUN 35* 37* 44*  CREATININE 3.60* 3.61* 2.82*  CALCIUM 9.6  9.1 9.2   GFR: Estimated Creatinine Clearance: 40.2 mL/min (A) (by C-G formula based on SCr of 2.82 mg/dL (H)). Liver Function Tests: Recent Labs  Lab 07/01/17 0412  AST 19  ALT 20  ALKPHOS 100  BILITOT 1.7*  PROT 7.2  ALBUMIN 2.9*   No results for input(s): LIPASE, AMYLASE in the last 168 hours. No results for input(s): AMMONIA in the last 168 hours. Coagulation Profile: Recent Labs  Lab 06/30/17 1113  INR 1.31   Cardiac Enzymes: No results for input(s): CKTOTAL, CKMB, CKMBINDEX, TROPONINI in the last 168 hours. BNP (last 3 results) Recent Labs    02/22/17 0926 05/03/17 1021  PROBNP 87 126   HbA1C: No results for input(s): HGBA1C in the last 72 hours. CBG: Recent Labs  Lab 06/30/17 1421  GLUCAP 98   Lipid Profile: No results for input(s): CHOL, HDL, LDLCALC, TRIG, CHOLHDL, LDLDIRECT in the last 72 hours. Thyroid Function Tests: No results for input(s): TSH, T4TOTAL, FREET4, T3FREE, THYROIDAB in the last 72 hours. Anemia Panel: No results for input(s): VITAMINB12, FOLATE, FERRITIN, TIBC, IRON, RETICCTPCT in the last 72 hours. Urine analysis:    Component Value Date/Time   COLORURINE AMBER (A) 06/30/2017 1445   APPEARANCEUR CLOUDY (A) 06/30/2017 1445   LABSPEC 1.014 06/30/2017 1445   PHURINE 5.0 06/30/2017 1445   GLUCOSEU NEGATIVE 06/30/2017 1445   HGBUR LARGE (A) 06/30/2017 1445   BILIRUBINUR NEGATIVE 06/30/2017 1445   KETONESUR NEGATIVE 06/30/2017 1445   PROTEINUR 100 (A) 06/30/2017 1445   UROBILINOGEN 0.2 03/04/2013 1020   NITRITE NEGATIVE 06/30/2017 1445   LEUKOCYTESUR MODERATE (A) 06/30/2017 1445     Ripudeep Rai M.D. [facility] 07/02/2017, 11:47 AM  Pager: [phone] Between 7am to 7pm - call Pager - [phone]  After 7pm go to [email or web address] - password [email or web address]  Call night coverage person covering after 7pm

## 2017-07-03 LAB — CBC
HCT: 35.2 % — ABNORMAL LOW (ref 39.0–52.0)
Hemoglobin: 11 g/dL — ABNORMAL LOW (ref 13.0–17.0)
MCH: 28.4 pg (ref 26.0–34.0)
MCHC: 31.3 g/dL (ref 30.0–36.0)
MCV: 91 fL (ref 78.0–100.0)
PLATELETS: 169 10*3/uL (ref 150–400)
RBC: 3.87 MIL/uL — ABNORMAL LOW (ref 4.22–5.81)
RDW: 14.8 % (ref 11.5–15.5)
WBC: 7.4 10*3/uL (ref 4.0–10.5)

## 2017-07-03 LAB — BASIC METABOLIC PANEL
Anion gap: 6 (ref 5–15)
BUN: 38 mg/dL — AB (ref 6–20)
CALCIUM: 9.2 mg/dL (ref 8.9–10.3)
CO2: 18 mmol/L — ABNORMAL LOW (ref 22–32)
CREATININE: 2.47 mg/dL — AB (ref 0.61–1.24)
Chloride: 117 mmol/L — ABNORMAL HIGH (ref 101–111)
GFR calc Af Amer: 30 mL/min — ABNORMAL LOW (ref >60)
GFR calc non Af Amer: 26 mL/min — ABNORMAL LOW (ref >60)
GLUCOSE: 87 mg/dL (ref 65–99)
POTASSIUM: 4.5 mmol/L (ref 3.5–5.1)
SODIUM: 141 mmol/L (ref 135–145)

## 2017-07-03 LAB — PROCALCITONIN: PROCALCITONIN: 4.55 ng/mL

## 2017-07-03 NOTE — Progress Notes (Signed)
[facility]                                                                              Patient Demographics  Larry [name], is a 67 y.o. male, DOB - 09/17/1950, VEL:[id number]  Admit date - 06/30/2017   Admitting Physician Larry Odea, MD  Outpatient Primary MD for the patient is Larry Landsman, MD  Outpatient specialists:   LOS - 3  days   Medical records reviewed and are as summarized below:    Chief Complaint  Patient presents with  . Tachycardia  . Dizziness       Brief summary   Patient is a 67 year old male withectopic atrial tachycardia, three-vessel CAD status post stents to RCA in 2585, grade 1 diastolic dysfunction, stage III CKD, hypertension, sleep apnea on CPAP, history of colon cancer and underwent a colostomy reversal in 2778 that was complicated with postoperative anastomotic leak, respiratory failure requiring tracheostomy and LTAC stay.  He now has a chronic ileostomy.  Patient presented with dizziness, generalized malaise, urinary urgency for several days, low-grade temp of 99.9 F in the ED with suboptimal BP readings, serum lactate elevated at 2.25.  Patient was found to have UTI.  CT abdomen pelvis showed 7 mm distal left ureteral calculus causing minimal left hydronephrosis and hydroureter.   Assessment & Plan    Principal Problem:   Sepsis due to urinary tract infection (Larry [name]), pseudomonas bacteremia -Patient met sepsis criteria with fever, lactic acidosis, hypotension, UTI - blood cultures positive for Pseudomonas -Changed antibiotics to IV cefepime on 5/11. -Repeat procalcitonin, blood cultures. - D/w ID, Dr Larry [name] on 5/11, recommended to follow sensitivities, transition to oral antibiotics at dc for 2 weeks. No need of Echo.  -Urine culture also showed Pseudomonas, susceptible to ciprofloxacin.  Will change to oral ciprofloxacin for 2 weeks  Active Problems:   Hydronephrosis of left kidney with hydroureter,  nephrolithiasis -CT abdomen showed 7 mm distal left ureteral calculus causing minimal left hydronephrosis and hydroureter. -patient underwent cystoscopy with left retrograde pyelogram, left ureteral stent placement, evacuation/removal of bladder calculi -urology following, continue IV cefepime, PT OT, ambulation, possible Foley out if ambulating -Outpatient management of the stone in 2 weeks after the treatment of infection    Acute kidney injury superimposed on chronic kidney disease III (Larry [name]) -Likely due to #1, #2.  Baseline creatinine 2-2.1 -Continue IV fluid hydration, antibiotics, follow BMET, status post left ureteral stent placement -Creatinine function improving, 2.4 today continue to hold Lasix, Aldactone     Coronary artery disease -Currently stable, no chest pain, continue aspirin, Plavix, statin    Hypertension -Currently stable, continue beta-blocker    OSA on CPAP    COPD (chronic obstructive pulmonary disease) (HCC) -Stable, no wheezing  History of chronic diastolic CHF, ischemic cardiomyopathy -Recent echo 2/18 showed EF of 55%  -Given ileostomy with high liquid output, acute kidney injury, holding diuretics -No ACE/ARB secondary to CKD     Ectopic atrial tachycardia (HCC) -BP currently stable    history of colostomy reversal -History of colon cancer, underwent a colostomy reversal in 2423 that was complicated with postoperative anastomotic leak, now with ileostomy  Code Status: Full code DVT Prophylaxis: SCDs Family Communication: Discussed in detail with the patient, all imaging results, lab results explained to the patient    Disposition Plan:  once cleared by urology Time Spent in minutes 25 minutes  Procedures:  5/10 cystoscopy with left ureteral stent, evacuation of the stone  Consultants:   Urology  Antimicrobials:   IV Rocephin   Medications  Scheduled Meds: . aspirin EC  81 mg Oral Daily  . atorvastatin  40 mg Oral QHS  . clopidogrel   75 mg Oral Daily  . ferrous sulfate  325 mg Oral Q breakfast  . heparin injection (subcutaneous)  5,000 Units Subcutaneous Q8H  . metoprolol tartrate  37.5 mg Oral BID  . simethicone  120 mg Oral TID BM  . sodium chloride flush  3 mL Intravenous Q12H   Continuous Infusions: . sodium chloride 100 mL/hr at 07/02/17 1447  . sodium chloride 10 mL/hr at 06/30/17 1809  . ceFEPime (MAXIPIME) IV Stopped (07/02/17 1519)   PRN Meds:.acetaminophen **OR** acetaminophen, levalbuterol, metoprolol tartrate, ondansetron **OR** ondansetron (ZOFRAN) IV   Antibiotics   Anti-infectives (From admission, onward)   Start     Dose/Rate Route Frequency Ordered Stop   07/01/17 1530  cefTRIAXone (ROCEPHIN) 2 g in sodium chloride 0.9 % 100 mL IVPB  Status:  Discontinued     2 g 200 mL/hr over 30 Minutes Intravenous Every 24 hours 06/30/17 1611 07/01/17 1334   07/01/17 1430  ceFEPIme (MAXIPIME) 2 g in sodium chloride 0.9 % 100 mL IVPB     2 g 200 mL/hr over 30 Minutes Intravenous Every 24 hours 07/01/17 1335     06/30/17 1530  cefTRIAXone (ROCEPHIN) 2 g in sodium chloride 0.9 % 100 mL IVPB     2 g 200 mL/hr over 30 Minutes Intravenous  Once 06/30/17 1518 06/30/17 1601        Subjective:   Larry [name] was seen and examined today.  No complaints, no fevers or chills.   Patient denies dizziness, abdominal pain, N/V/D/C, new weakness, numbess, tingling.   Objective:   Vitals:   07/02/17 2342 07/03/17 0500 07/03/17 0527 07/03/17 0855  BP:   117/76 126/83  Pulse: 86  80 84  Resp: 18  16   Temp:   98.9 F (37.2 C)   TempSrc:   Oral   SpO2: 98%  99%   Weight:  (!) 158.8 kg (350 lb 1.5 oz)    Height:        Intake/Output Summary (Last 24 hours) at 07/03/2017 1252 Last data filed at 07/03/2017 1200 Gross per 24 hour  Intake -  Output 13100 ml  Net -13100 ml     Wt Readings from Last 3 Encounters:  07/03/17 (!) 158.8 kg (350 lb 1.5 oz)  05/18/17 (!) 152.9 kg (337 lb)  05/03/17 (!) 156.9  kg (346 lb)     Exam   General: Alert and oriented x 3, NAD  Eyes:   HEENT:    Cardiovascular: S1 S2 auscultated, RRR  No pedal edema b/l  Respiratory: Clear to auscultation bilaterally, no wheezing, rales or rhonchi  Gastrointestinal: Soft, nontender, nondistended, + bowel sounds, ileostomy  Ext: no pedal edema bilaterally  Neuro: no new deficits  Musculoskeletal: No digital cyanosis, clubbing  Skin: No rashes  Psych: Normal affect and demeanor, alert and oriented x3        GU: Foley with dark orange urine, no clots  Data Reviewed:  I have personally reviewed following  labs and imaging studies  Micro Results Recent Results (from the past 240 hour(s))  Culture, blood (Routine x 2)     Status: Abnormal (Preliminary result)   Collection Time: 06/30/17 11:28 AM  Result Value Ref Range Status   Specimen Description BLOOD RIGHT HAND  Final   Special Requests   Final    BOTTLES DRAWN AEROBIC AND ANAEROBIC Blood Culture adequate volume   Culture  Setup Time   Final    GRAM NEGATIVE RODS IN BOTH AEROBIC AND ANAEROBIC BOTTLES CRITICAL RESULT CALLED TO, READ BACK BY AND VERIFIED WITH: R RUMBARGER,PAHRMD AT 1318 07/01/17 BY L BENFIELD    Culture (A)  Final    PSEUDOMONAS AERUGINOSA SUSCEPTIBILITIES TO FOLLOW Performed at [facility], [address] [place], [place] [address]    Report Status PENDING  Incomplete  Blood Culture ID Panel (Reflexed)     Status: Abnormal   Collection Time: 06/30/17 11:28 AM  Result Value Ref Range Status   Enterococcus species NOT DETECTED NOT DETECTED Final   Listeria monocytogenes NOT DETECTED NOT DETECTED Final   Staphylococcus species NOT DETECTED NOT DETECTED Final   Staphylococcus aureus NOT DETECTED NOT DETECTED Final   Streptococcus species NOT DETECTED NOT DETECTED Final   Streptococcus agalactiae NOT DETECTED NOT DETECTED Final   Streptococcus pneumoniae NOT DETECTED NOT DETECTED Final   Streptococcus pyogenes NOT  DETECTED NOT DETECTED Final   Acinetobacter baumannii NOT DETECTED NOT DETECTED Final   Enterobacteriaceae species NOT DETECTED NOT DETECTED Final   Enterobacter cloacae complex NOT DETECTED NOT DETECTED Final   Escherichia coli NOT DETECTED NOT DETECTED Final   Klebsiella oxytoca NOT DETECTED NOT DETECTED Final   Klebsiella pneumoniae NOT DETECTED NOT DETECTED Final   Proteus species NOT DETECTED NOT DETECTED Final   Serratia marcescens NOT DETECTED NOT DETECTED Final   Carbapenem resistance NOT DETECTED NOT DETECTED Final   Haemophilus influenzae NOT DETECTED NOT DETECTED Final   Neisseria meningitidis NOT DETECTED NOT DETECTED Final   Pseudomonas aeruginosa DETECTED (A) NOT DETECTED Final    Comment: CRITICAL RESULT CALLED TO, READ BACK BY AND VERIFIED WITH: R RUMBARGER,PHARMD AT 1318 07/01/17 BY L BENFIELD    Candida albicans NOT DETECTED NOT DETECTED Final   Candida glabrata NOT DETECTED NOT DETECTED Final   Candida krusei NOT DETECTED NOT DETECTED Final   Candida parapsilosis NOT DETECTED NOT DETECTED Final   Candida tropicalis NOT DETECTED NOT DETECTED Final    Comment: Performed at [facility], [address], [place], [place] [address]  Culture, blood (Routine x 2)     Status: None (Preliminary result)   Collection Time: 06/30/17 12:40 PM  Result Value Ref Range Status   Specimen Description BLOOD LEFT HAND  Final   Special Requests   Final    BOTTLES DRAWN AEROBIC AND ANAEROBIC Blood Culture adequate volume   Culture   Final    NO GROWTH 2 DAYS Performed at [facility], [place] [address] [place], [place] [address]    Report Status PENDING  Incomplete  Urine culture     Status: Abnormal   Collection Time: 06/30/17  3:19 PM  Result Value Ref Range Status   Specimen Description URINE, RANDOM  Final   Special Requests   Final    NONE Performed at [facility], [place] [address], [place], [place] [address]    Culture >=100,000 COLONIES/mL PSEUDOMONAS  AERUGINOSA (A)  Final   Report Status 07/02/2017 FINAL  Final  Organism ID, Bacteria PSEUDOMONAS AERUGINOSA (A)  Final      Susceptibility   Pseudomonas aeruginosa - MIC*    CEFTAZIDIME 2 SENSITIVE Sensitive     CIPROFLOXACIN <=0.25 SENSITIVE Sensitive     GENTAMICIN <=1 SENSITIVE Sensitive     IMIPENEM 2 SENSITIVE Sensitive     PIP/TAZO <=4 SENSITIVE Sensitive     CEFEPIME <=1 SENSITIVE Sensitive     * >=100,000 COLONIES/mL PSEUDOMONAS AERUGINOSA    Radiology Reports Ct Abdomen Pelvis Wo Contrast  Result Date: 06/30/2017 CLINICAL DATA:  Left abdominal pain. Clinically suspected gastroenteritis or colitis. EXAM: CT ABDOMEN AND PELVIS WITHOUT CONTRAST TECHNIQUE: Multidetector CT imaging of the abdomen and pelvis was performed following the standard protocol without IV contrast. COMPARISON:  04/28/2016. FINDINGS: Lower chest: Minimal pericardial fluid. Atheromatous coronary artery calcifications. Minimal bibasilar atelectasis or scarring. Hepatobiliary: Multiple tiny gallstones in the gallbladder measuring up to 3 mm in maximum diameter each. No gallbladder wall thickening or pericholecystic fluid. Normal appearing liver. Pancreas: Unremarkable. No pancreatic ductal dilatation or surrounding inflammatory changes. Spleen: Normal in size without focal abnormality. Adrenals/Urinary Tract: Interval diffuse enlargement of the left kidney with mild perinephric and left periureteric soft tissue stranding and ill-defined margins of the kidney. There is also minimal dilatation of the left renal collecting system and ureter to the level of an interval 7 mm calculus in the distal left ureter. There are multiple small interval bladder calculi of similar size or smaller. There are also interval multiple similar-sized or smaller calculi within a right bladder diverticulum. Bilateral renal cysts are noted. Otherwise, unremarkable right kidney and right ureter. Unremarkable adrenal glands. Stomach/Bowel: Multiple  sigmoid and descending colon diverticula. Mid transverse colon anastomosis. Right lower quadrant small bowel ostomy. No bowel dilatation. Unremarkable stomach. Vascular/Lymphatic: Atheromatous arterial calcifications without aneurysm. No enlarged lymph nodes. Reproductive: Minimally enlarged prostate gland containing coarse calcifications. Other: Laxity of the midline abdominal wall extending to the skin. This has appearance compatible with an old open anterior abdominal wall surgical wound. Musculoskeletal: Lumbar and lower thoracic spine degenerative changes. IMPRESSION: 1. Interval 7 mm distal left ureteral calculus causing minimal left hydronephrosis and hydroureter. 2. Interval multiple bladder calculi. 3. Interval multiple calculi within a right-sided bladder diverticulum. 4. Sigmoid and descending colon diverticulosis. 5. Cholelithiasis. 6. Atheromatous arterial calcifications, including the coronary arteries. 7. Minimal pericardial effusion. Electronically Signed   By: Claudie Revering M.D.   On: 06/30/2017 16:43   Dg Chest 2 View  Result Date: 06/30/2017 CLINICAL DATA:  Acute chest pain and shortness of breath for 1 day. EXAM: CHEST - 2 VIEW COMPARISON:  05/26/2016 and prior radiograph FINDINGS: UPPER limits normal heart size noted. Mild bibasilar atelectasis/scarring is unchanged. There is no evidence of focal airspace disease, pulmonary edema, suspicious pulmonary nodule/mass, pleural effusion, or pneumothorax. No acute bony abnormalities are identified. IMPRESSION: No evidence of acute cardiopulmonary disease. Electronically Signed   By: Margarette Canada M.D.   On: 06/30/2017 13:24   Dg Cystogram  Result Date: 07/03/2017 CLINICAL DATA:  Left ureteral calculus and bladder calculi.  UTI. EXAM: CYSTOGRAM TECHNIQUE: Intraoperative retrograde pyelogram. COMPARISON:  CT abdomen pelvis 06/30/2017 FINDINGS: Retrograde injection of the ureter, presumably on the left. The image is not labeled for laterality.  Ureteral stent in place. No hydronephrosis. IMPRESSION: Left ureteral stent placement. Electronically Signed   By: Franchot Gallo M.D.   On: 07/03/2017 07:34    Lab Data:  CBC: Recent Labs  Lab 06/30/17 1112 07/01/17 0412 07/02/17 0407 07/03/17 0624  WBC  16.3* 13.8* 11.9* 7.4  HGB 12.5* 12.3* 11.3* 11.0*  HCT 38.2* 38.9* 36.3* 35.2*  MCV 89.3 91.1 91.0 91.0  PLT 140* 131* 159 366   Basic Metabolic Panel: Recent Labs  Lab 06/30/17 1112 07/01/17 0412 07/02/17 0407 07/03/17 0624  NA 139 143 141 141  K 3.8 4.2 4.5 4.5  CL 107 111 112* 117*  CO2 19* 22 18* 18*  GLUCOSE 108* 133* 121* 87  BUN 35* 37* 44* 38*  CREATININE 3.60* 3.61* 2.82* 2.47*  CALCIUM 9.6 9.1 9.2 9.2   GFR: Estimated Creatinine Clearance: 46.5 mL/min (A) (by C-G formula based on SCr of 2.47 mg/dL (H)). Liver Function Tests: Recent Labs  Lab 07/01/17 0412  AST 19  ALT 20  ALKPHOS 100  BILITOT 1.7*  PROT 7.2  ALBUMIN 2.9*   No results for input(s): LIPASE, AMYLASE in the last 168 hours. No results for input(s): AMMONIA in the last 168 hours. Coagulation Profile: Recent Labs  Lab 06/30/17 1113  INR 1.31   Cardiac Enzymes: No results for input(s): CKTOTAL, CKMB, CKMBINDEX, TROPONINI in the last 168 hours. BNP (last 3 results) Recent Labs    02/22/17 0926 05/03/17 1021  PROBNP 87 126   HbA1C: No results for input(s): HGBA1C in the last 72 hours. CBG: Recent Labs  Lab 06/30/17 1421  GLUCAP 98   Lipid Profile: No results for input(s): CHOL, HDL, LDLCALC, TRIG, CHOLHDL, LDLDIRECT in the last 72 hours. Thyroid Function Tests: No results for input(s): TSH, T4TOTAL, FREET4, T3FREE, THYROIDAB in the last 72 hours. Anemia Panel: No results for input(s): VITAMINB12, FOLATE, FERRITIN, TIBC, IRON, RETICCTPCT in the last 72 hours. Urine analysis:    Component Value Date/Time   COLORURINE AMBER (A) 06/30/2017 1445   APPEARANCEUR CLOUDY (A) 06/30/2017 1445   LABSPEC 1.014 06/30/2017 1445    PHURINE 5.0 06/30/2017 1445   GLUCOSEU NEGATIVE 06/30/2017 1445   HGBUR LARGE (A) 06/30/2017 1445   BILIRUBINUR NEGATIVE 06/30/2017 1445   KETONESUR NEGATIVE 06/30/2017 1445   PROTEINUR 100 (A) 06/30/2017 1445   UROBILINOGEN 0.2 03/04/2013 1020   NITRITE NEGATIVE 06/30/2017 1445   LEUKOCYTESUR MODERATE (A) 06/30/2017 1445     Denym Rahimi M.D. [facility] 07/03/2017, 12:52 PM  Pager: [phone] Between 7am to 7pm - call Pager - [phone]  After 7pm go to [email or web address] - password [email or web address]  Call night coverage person covering after 7pm

## 2017-07-03 NOTE — Evaluation (Signed)
Physical Therapy Evaluation Patient Details Name: Larry [name] MRN: [id number] DOB: [birth year] Today's Date: 07/03/2017   History of Present Illness  Pt is a 67 y/o male who presents with sepsis 2 UTI and kidney stone. PMH significant for ectopic atrial tachycardia, CAD s/p stents to RCA 2010, CKD III, HTN, sleep apnea on CPAP, colon cancer s/p colostomy reversal in 3810 that was complicated with post-op anastomotic leak, resp failure requiring trach and LTACH stay. He now has a chronic ileostomy.   Clinical Impression  Pt admitted with above diagnosis. Pt currently with functional limitations due to the deficits listed below (see PT Problem List). At the time of PT eval pt was able to perform transfers and ambulation with gross min guard assist to min assist for balance support and safety with the RW. Pt does not typically use an AD at baseline, however recommend continued use due to decreased balance and decreased tolerance for functional activity. SOB reported with all mobility, however O2 remained ~96-99% on RA throughout session. Pt will benefit from skilled PT to increase their independence and safety with mobility to allow discharge to the venue listed below.       Follow Up Recommendations Home health PT;Supervision for mobility/OOB    Equipment Recommendations  3in1 (PT)    Recommendations for Other Services       Precautions / Restrictions Precautions Precautions: Fall Restrictions Weight Bearing Restrictions: No      Mobility  Bed Mobility Overal bed mobility: Needs Assistance Bed Mobility: Supine to Sit;Sit to Supine     Supine to sit: Min guard Sit to supine: Min assist   General bed mobility comments: Assist for LE elevation back up into bed at end of session. Pt requiring increased time and effort to transition to EOB.   Transfers Overall transfer level: Needs assistance Equipment used: Rolling walker (2 wheeled) Transfers: Sit to/from Stand Sit to  Stand: Min assist         General transfer comment: VC's for hand placement on seated surface for safety. Assist for balance support as pt powered up to full stand from low bed height.   Ambulation/Gait Ambulation/Gait assistance: Min guard Ambulation Distance (Feet): 30 Feet Assistive device: Rolling walker (2 wheeled) Gait Pattern/deviations: Step-through pattern;Decreased stride length;Trunk flexed Gait velocity: Decreased Gait velocity interpretation: <1.31 ft/sec, indicative of household ambulator General Gait Details: Pt with mild shortness of breath almost immediately upon initiating gait training. Pt ambulated to the hall and back to the bed only. Grossly flexed trunk.   Stairs            Wheelchair Mobility    Modified Rankin (Stroke Patients Only)       Balance Overall balance assessment: Needs assistance Sitting-balance support: Feet supported;No upper extremity supported Sitting balance-Leahy Scale: Fair     Standing balance support: During functional activity;Bilateral upper extremity supported Standing balance-Leahy Scale: Poor Standing balance comment: Reliant on RW for balance support                             Pertinent Vitals/Pain Pain Assessment: No/denies pain    Home Living Family/patient expects to be discharged to:: Private residence Living Arrangements: Spouse/significant other Available Help at Discharge: Family;Available 24 hours/day;Personal care attendant Type of Home: House Home Access: Level entry     Home Layout: One level Home Equipment: Walker - 4 wheels      Prior Function Level of Independence: Independent  Hand Dominance   Dominant Hand: Right    Extremity/Trunk Assessment   Upper Extremity Assessment Upper Extremity Assessment: Defer to OT evaluation    Lower Extremity Assessment Lower Extremity Assessment: Generalized weakness    Cervical / Trunk Assessment Cervical / Trunk  Assessment: Normal  Communication   Communication: No difficulties  Cognition Arousal/Alertness: Awake/alert Behavior During Therapy: WFL for tasks assessed/performed Overall Cognitive Status: Within Functional Limits for tasks assessed                                        General Comments      Exercises     Assessment/Plan    PT Assessment Patient needs continued PT services  PT Problem List Decreased strength;Decreased range of motion;Decreased activity tolerance;Decreased balance;Decreased mobility;Decreased knowledge of use of DME;Decreased safety awareness;Decreased knowledge of precautions;Pain       PT Treatment Interventions DME instruction;Gait training;Stair training;Functional mobility training;Therapeutic activities;Therapeutic exercise;Neuromuscular re-education;Patient/family education    PT Goals (Current goals can be found in the Care Plan section)  Acute Rehab PT Goals Patient Stated Goal: Return to PLOF PT Goal Formulation: With patient Time For Goal Achievement: 07/10/17 Potential to Achieve Goals: Good    Frequency Min 3X/week   Barriers to discharge        Co-evaluation               AM-PAC PT "6 Clicks" Daily Activity  Outcome Measure Difficulty turning over in bed (including adjusting bedclothes, sheets and blankets)?: A Little Difficulty moving from lying on back to sitting on the side of the bed? : Unable Difficulty sitting down on and standing up from a chair with arms (e.g., wheelchair, bedside commode, etc,.)?: Unable Help needed moving to and from a bed to chair (including a wheelchair)?: A Little Help needed walking in hospital room?: A Little Help needed climbing 3-5 steps with a railing? : A Lot 6 Click Score: 13    End of Session Equipment Utilized During Treatment: Gait belt Activity Tolerance: Patient tolerated treatment well Patient left: in bed;with call bell/phone within reach Nurse Communication:  Mobility status PT Visit Diagnosis: Unsteadiness on feet (R26.81);Muscle weakness (generalized) (M62.81)    Time: 4782-9562 PT Time Calculation (min) (ACUTE ONLY): 21 min   Charges:   PT Evaluation $PT Eval Moderate Complexity: 1 Mod     PT G Codes:        Larry [name], PT, DPT Acute Rehabilitation Services Pager: [phone]   [name] 07/03/2017, 3:02 PM

## 2017-07-03 NOTE — Progress Notes (Signed)
Bleeding from around foley catheter that has started yesterday 07/02/2017. Urine is still red tinged. Pt is not complaining of pain at this time. Notified Rai, MD. Will continue to monitor pt.

## 2017-07-03 NOTE — Progress Notes (Signed)
Placed patient on BIPAP via nasal prongs (auto settings) with 2 lpm O2 bleed in.

## 2017-07-04 DIAGNOSIS — I5022 Chronic systolic (congestive) heart failure: Secondary | ICD-10-CM

## 2017-07-04 LAB — BASIC METABOLIC PANEL
Anion gap: 9 (ref 5–15)
BUN: 34 mg/dL — ABNORMAL HIGH (ref 6–20)
CALCIUM: 9.3 mg/dL (ref 8.9–10.3)
CHLORIDE: 114 mmol/L — AB (ref 101–111)
CO2: 18 mmol/L — ABNORMAL LOW (ref 22–32)
CREATININE: 2.27 mg/dL — AB (ref 0.61–1.24)
GFR calc Af Amer: 33 mL/min — ABNORMAL LOW (ref >60)
GFR calc non Af Amer: 29 mL/min — ABNORMAL LOW (ref >60)
Glucose, Bld: 90 mg/dL (ref 65–99)
Potassium: 4.5 mmol/L (ref 3.5–5.1)
SODIUM: 141 mmol/L (ref 135–145)

## 2017-07-04 LAB — CULTURE, BLOOD (ROUTINE X 2): Special Requests: ADEQUATE

## 2017-07-04 LAB — CBC
HCT: 35.2 % — ABNORMAL LOW (ref 39.0–52.0)
HEMOGLOBIN: 10.9 g/dL — AB (ref 13.0–17.0)
MCH: 28.2 pg (ref 26.0–34.0)
MCHC: 31 g/dL (ref 30.0–36.0)
MCV: 91 fL (ref 78.0–100.0)
Platelets: 160 10*3/uL (ref 150–400)
RBC: 3.87 MIL/uL — ABNORMAL LOW (ref 4.22–5.81)
RDW: 14.7 % (ref 11.5–15.5)
WBC: 7.4 10*3/uL (ref 4.0–10.5)

## 2017-07-04 MED ORDER — TAMSULOSIN HCL 0.4 MG PO CAPS
0.4000 mg | ORAL_CAPSULE | Freq: Every day | ORAL | Status: DC
  Administered 2017-07-04: 0.4 mg via ORAL
  Filled 2017-07-04: qty 1

## 2017-07-04 MED ORDER — DILTIAZEM HCL 30 MG PO TABS: 30.0000 mg | ORAL_TABLET | Freq: Three times a day (TID) | ORAL | 1 refills | Status: DC

## 2017-07-04 MED ORDER — CIPROFLOXACIN HCL 500 MG PO TABS: 500.0000 mg | ORAL_TABLET | Freq: Two times a day (BID) | ORAL | 0 refills | Status: AC

## 2017-07-04 MED ORDER — SPIRONOLACTONE 25 MG PO TABS: 12.5000 mg | ORAL_TABLET | Freq: Every day | ORAL | 6 refills | Status: DC

## 2017-07-04 MED ORDER — FUROSEMIDE 40 MG PO TABS: 60.0000 mg | ORAL_TABLET | Freq: Every day | ORAL | 1 refills | Status: DC

## 2017-07-04 MED ORDER — CIPROFLOXACIN HCL 500 MG PO TABS
500.0000 mg | ORAL_TABLET | Freq: Once | ORAL | Status: AC
  Administered 2017-07-04: 500 mg via ORAL
  Filled 2017-07-04: qty 1

## 2017-07-04 MED ORDER — TAMSULOSIN HCL 0.4 MG PO CAPS: 0.4000 mg | ORAL_CAPSULE | Freq: Every day | ORAL | 3 refills | Status: DC

## 2017-07-04 NOTE — Progress Notes (Signed)
Domenick L Poon to be D/C'd Home per MD order.  Discussed with the patient and all questions fully answered.  VSS, Skin clean, dry and intact without evidence of skin break down, no evidence of skin tears noted. IV catheter discontinued intact. Site without signs and symptoms of complications. Dressing and pressure applied. Pt is going home with a urinary catheter. Given proper instruction and education on foley care and maintainance.   An After Visit Summary was printed and given to the patient. Patient received prescription.  D/c education completed with patient/family including follow up instructions, medication list, d/c activities limitations if indicated, with other d/c instructions as indicated by MD - patient able to verbalize understanding, all questions fully answered.   Patient instructed to return to ED, call 911, or call MD for any changes in condition.   Patient escorted via [place], and D/C home via private auto.  Richardean Chimera 07/04/2017 4:10 PM

## 2017-07-04 NOTE — Discharge Summary (Signed)
Physician Discharge Summary   Patient ID: Larry [name] MRN: [id number] DOB/AGE: 04-26-1950 67 y.o.  Admit date: 06/30/2017 Discharge date: 07/04/2017  Primary Care Physician:  Lin Landsman, MD   Recommendations for Outpatient Follow-up:  1. Follow up with PCP in 1-2 weeks 2. Please obtain BMP/CBC in one week 3. Patient was recommended to continue Foley catheter until follow-up with urology, Dr. Gloriann Loan.  Voiding trial in the office  Home Health: Home health PT OT, RN Equipment/Devices: 3 and 1  Discharge Condition: stable  CODE STATUS: FULL  Diet recommendation: Heart healthy diet   Discharge Diagnoses:    . Sepsis due to urinary tract infection (HCC)  Pseudomonas bacteremia  Pseudomonas UTI Obstructing nephrolithiasis status post cystoscopy, left ureteral stent . Acute kidney injury superimposed on chronic kidney disease III ([place]) . Coronary artery disease . Hypertension . COPD (chronic obstructive pulmonary disease) ([place]) . Ectopic atrial tachycardia ([place]) . History of colostomy reversal . Hydronephrosis of left kidney with hydroureter Chronic diastolic CHF, EF 68%  Consults:  Urology, Dr Gloriann Loan     Allergies:  No Known Allergies   DISCHARGE MEDICATIONS: Allergies as of 07/04/2017   No Known Allergies     Medication List    TAKE these medications   aspirin EC 81 MG tablet Take 1 tablet (81 mg total) by mouth daily.   atorvastatin 40 MG tablet Commonly known as:  LIPITOR Take 1 tablet (40 mg total) by mouth daily. What changed:  when to take this   CENTRUM SILVER 50+MEN Tabs Take 1 tablet by mouth daily with lunch.   ciprofloxacin 500 MG tablet Commonly known as:  CIPRO Take 1 tablet (500 mg total) by mouth 2 (two) times daily for 12 days.   clopidogrel 75 MG tablet Commonly known as:  PLAVIX Take 75 mg by mouth daily.   diltiazem 30 MG tablet Commonly known as:  CARDIZEM Take 1 tablet (30 mg total) by mouth 3 (three) times daily. What  changed:    medication strength  how much to take   ferrous sulfate 325 (65 FE) MG tablet Take 1 tablet (325 mg total) by mouth daily with breakfast.   furosemide 40 MG tablet Commonly known as:  LASIX Take 1.5 tablets (60 mg total) by mouth daily with lunch. Start taking on:  07/06/2017 What changed:    how much to take  when to take this  additional instructions  These instructions start on 07/06/2017. If you are unsure what to do until then, ask your doctor or other care provider.   levalbuterol 45 MCG/ACT inhaler Commonly known as:  XOPENEX HFA INHALE 2 PUFFS INTO THE LUNGS EVERY 4 (FOUR) HOURS AS NEEDED FOR WHEEZING.   metoprolol tartrate 25 MG tablet Commonly known as:  LOPRESSOR Take 1.5 tablets (37.5 mg total) by mouth 2 (two) times daily. What changed:  how much to take   simethicone 125 MG chewable tablet Commonly known as:  MYLICON Chew 341 mg by mouth 3 (three) times daily.   GAS-X EXTRA STRENGTH 125 MG chewable tablet Generic drug:  simethicone Chew 125 mg by mouth 3 (three) times daily.   spironolactone 25 MG tablet Commonly known as:  ALDACTONE Take 0.5 tablets (12.5 mg total) by mouth daily. HOLD until follow-up with your cardiologist What changed:  additional instructions   tamsulosin 0.4 MG Caps capsule Commonly known as:  FLOMAX Take 1 capsule (0.4 mg total) by mouth daily. Start taking on:  07/05/2017  Durable Medical Equipment  (From admission, onward)        Start     Ordered   07/04/17 1257  For home use only DME 3 n 1  Once     07/04/17 1257       Brief H and P: For complete details please refer to admission H and P, but in brief Patient is a 67 year old male withectopic atrial tachycardia, three-vessel CAD status post stents to RCA in 3903, grade 1 diastolic dysfunction, stage III CKD, hypertension, sleep apnea on CPAP, history of colon cancer and underwent a colostomy reversal in 0092 that was complicated with  postoperative anastomotic leak, respiratory failure requiring tracheostomy and LTACstay. He now has a chronic ileostomy.  Patient presented with dizziness, generalized malaise, urinary urgency for several days, low-grade temp of 99.9 F in the ED with suboptimal BP readings, serum lactate elevated at 2.25.  Patient was found to have UTI.  CT abdomen pelvis showed 7 mm distal left ureteral calculus causing minimal left hydronephrosis and hydroureter.   Hospital Course:    Sepsis due to urinary tract infection ([place]), pseudomonas bacteremia -Patient met sepsis criteria with fever, lactic acidosis, hypotension, UTI - blood cultures positive for Pseudomonas -Changed antibiotics to IV cefepime on 5/11, . -Urine culture also showed Pseudomonas, susceptible to ciprofloxacin.  -Discharge on ciprofloxacin oral 500 mg twice a day for 12 more days (dosing confirmed with pharmacy)     Hydronephrosis of left kidney with hydroureter, nephrolithiasis -CT abdomen showed 7 mm distal left ureteral calculus causing minimal left hydronephrosis and hydroureter. -patient underwent cystoscopy with left retrograde pyelogram, left ureteral stent placement, evacuation/removal of bladder calculi --Outpatient management of the stone in 2 weeks after the treatment of infection -Foley was discontinued on 5/14 a.m. however patient had only 30 cc output, small hematuria/clot.  Discussed with Dr. Tresa Moore, urology, recommended to replace the Foley back and continue until voiding trial in the office.     Acute kidney injury superimposed on chronic kidney disease III ([place]) -Likely due to #1, #2.  Baseline creatinine 2-2.1, admitted with creatinine of 3.61 -Patient was placed on IV fluid hydration.  Creatinine has improved to 2.2 at the time of discharge. - status post left ureteral stent placement -Creatinine function improving, 2.2 today, close to baseline, patient may restart Lasix on 5/16.  However hold Aldactone until  follow-up with his cardiologist.      Coronary artery disease -Currently stable, no chest pain, continue aspirin, Plavix, statin    Hypertension -Currently stable, continue beta-blocker    OSA on CPAP    COPD (chronic obstructive pulmonary disease) (HCC) -Stable, no wheezing  History of chronic diastolic CHF, ischemic cardiomyopathy -Recent echo 2/18 showed EF of 55%  -Given ileostomy with high liquid output, acute kidney injury, diuretics were held while inpatient  -No ACE/ARB secondary to CKD  Creatinine now improved to 2.2, patient recommended to restart Lasix on 5/16 however continue to hold Aldactone until follow-up with his cardiologist     Ectopic atrial tachycardia ([place]) -BP currently stable    history of colostomy reversal -History of colon cancer, underwent a colostomy reversal in 3300 that was complicated with postoperative anastomotic leak, now with ileostomy   Day of Discharge S: Feeling better, no fevers or chills, hoping to go home today  BP (!) 142/86   Pulse 90   Temp 99 F (37.2 C) (Oral)   Resp 18   Ht 5' 11"  (1.803 m)   Wt (!) 158.8 kg (350  lb 1.5 oz)   SpO2 99%   BMI 48.83 kg/m   Physical Exam: General: Alert and awake oriented x3 not in any acute distress. HEENT: anicteric sclera, pupils reactive to light and accommodation CVS: S1-S2 clear no murmur rubs or gallops Chest: clear to auscultation bilaterally, no wheezing rales or rhonchi Abdomen: soft nontender, nondistended, normal bowel sounds Extremities: no cyanosis, clubbing or edema noted bilaterally Neuro: Cranial nerves II-XII intact, no focal neurological deficits GU: Foley to be replaced again   The results of significant diagnostics from this hospitalization (including imaging, microbiology, ancillary and laboratory) are listed below for reference.      Procedures/Studies:  Ct Abdomen Pelvis Wo Contrast  Result Date: 06/30/2017 CLINICAL DATA:  Left abdominal pain.  Clinically suspected gastroenteritis or colitis. EXAM: CT ABDOMEN AND PELVIS WITHOUT CONTRAST TECHNIQUE: Multidetector CT imaging of the abdomen and pelvis was performed following the standard protocol without IV contrast. COMPARISON:  04/28/2016. FINDINGS: Lower chest: Minimal pericardial fluid. Atheromatous coronary artery calcifications. Minimal bibasilar atelectasis or scarring. Hepatobiliary: Multiple tiny gallstones in the gallbladder measuring up to 3 mm in maximum diameter each. No gallbladder wall thickening or pericholecystic fluid. Normal appearing liver. Pancreas: Unremarkable. No pancreatic ductal dilatation or surrounding inflammatory changes. Spleen: Normal in size without focal abnormality. Adrenals/Urinary Tract: Interval diffuse enlargement of the left kidney with mild perinephric and left periureteric soft tissue stranding and ill-defined margins of the kidney. There is also minimal dilatation of the left renal collecting system and ureter to the level of an interval 7 mm calculus in the distal left ureter. There are multiple small interval bladder calculi of similar size or smaller. There are also interval multiple similar-sized or smaller calculi within a right bladder diverticulum. Bilateral renal cysts are noted. Otherwise, unremarkable right kidney and right ureter. Unremarkable adrenal glands. Stomach/Bowel: Multiple sigmoid and descending colon diverticula. Mid transverse colon anastomosis. Right lower quadrant small bowel ostomy. No bowel dilatation. Unremarkable stomach. Vascular/Lymphatic: Atheromatous arterial calcifications without aneurysm. No enlarged lymph nodes. Reproductive: Minimally enlarged prostate gland containing coarse calcifications. Other: Laxity of the midline abdominal wall extending to the skin. This has appearance compatible with an old open anterior abdominal wall surgical wound. Musculoskeletal: Lumbar and lower thoracic spine degenerative changes. IMPRESSION: 1.  Interval 7 mm distal left ureteral calculus causing minimal left hydronephrosis and hydroureter. 2. Interval multiple bladder calculi. 3. Interval multiple calculi within a right-sided bladder diverticulum. 4. Sigmoid and descending colon diverticulosis. 5. Cholelithiasis. 6. Atheromatous arterial calcifications, including the coronary arteries. 7. Minimal pericardial effusion. Electronically Signed   By: Claudie Revering M.D.   On: 06/30/2017 16:43   Dg Chest 2 View  Result Date: 06/30/2017 CLINICAL DATA:  Acute chest pain and shortness of breath for 1 day. EXAM: CHEST - 2 VIEW COMPARISON:  05/26/2016 and prior radiograph FINDINGS: UPPER limits normal heart size noted. Mild bibasilar atelectasis/scarring is unchanged. There is no evidence of focal airspace disease, pulmonary edema, suspicious pulmonary nodule/mass, pleural effusion, or pneumothorax. No acute bony abnormalities are identified. IMPRESSION: No evidence of acute cardiopulmonary disease. Electronically Signed   By: Margarette Canada M.D.   On: 06/30/2017 13:24   Dg Cystogram  Result Date: 07/03/2017 CLINICAL DATA:  Left ureteral calculus and bladder calculi.  UTI. EXAM: CYSTOGRAM TECHNIQUE: Intraoperative retrograde pyelogram. COMPARISON:  CT abdomen pelvis 06/30/2017 FINDINGS: Retrograde injection of the ureter, presumably on the left. The image is not labeled for laterality. Ureteral stent in place. No hydronephrosis. IMPRESSION: Left ureteral stent placement. Electronically Signed  By: Franchot Gallo M.D.   On: 07/03/2017 07:34      LAB RESULTS: Basic Metabolic Panel: Recent Labs  Lab 07/03/17 0624 07/04/17 0409  NA 141 141  K 4.5 4.5  CL 117* 114*  CO2 18* 18*  GLUCOSE 87 90  BUN 38* 34*  CREATININE 2.47* 2.27*  CALCIUM 9.2 9.3   Liver Function Tests: Recent Labs  Lab 07/01/17 0412  AST 19  ALT 20  ALKPHOS 100  BILITOT 1.7*  PROT 7.2  ALBUMIN 2.9*   No results for input(s): LIPASE, AMYLASE in the last 168 hours. No  results for input(s): AMMONIA in the last 168 hours. CBC: Recent Labs  Lab 07/03/17 0624 07/04/17 0409  WBC 7.4 7.4  HGB 11.0* 10.9*  HCT 35.2* 35.2*  MCV 91.0 91.0  PLT 169 160   Cardiac Enzymes: No results for input(s): CKTOTAL, CKMB, CKMBINDEX, TROPONINI in the last 168 hours. BNP: Invalid input(s): POCBNP CBG: Recent Labs  Lab 06/30/17 1421  GLUCAP 98      Disposition and Follow-up: Discharge Instructions    Diet - low sodium heart healthy   Complete by:  As directed    Discharge instructions   Complete by:  As directed    Please continue foley catheter until you see urologist in office in 7-10 days. You will have voiding trial in office. Hold Aldactone until follow-up with your cardiologist.   Increase activity slowly   Complete by:  As directed        DISPOSITION: Home with home health PT, OT, RN 20 minutes   [place]    Marton Redwood III, MD. Schedule an appointment as soon as possible for a visit in 1 week(s).   Specialty:  Urology Why:  follow in 7-10days, voiding trial in office  Contact information: [place] [address] [phone]        Lin Landsman, MD. Schedule an appointment as soon as possible for a visit in 2 week(s).   Specialty:  Family Medicine Contact information: [place] [address] [phone]        Burtis Junes, NP. Schedule an appointment as soon as possible for a visit in 1 week(s).   Specialties:  Nurse Practitioner, Interventional Cardiology, Cardiology, Radiology Why:  for follow-up Contact information: [place]. [address] [place] [address] [phone]            Time coordinating discharge:  40 mins   Signed:   Estill Cotta M.D. [facility] 07/04/2017, 1:21 PM Pager: [phone]

## 2017-07-04 NOTE — Care Management Note (Addendum)
Case Management Note  Patient Details  Name: Larry [name] MRN: [id number] Date of Birth: [birth year]  Subjective/Objective:          Pt admitted with sepsis          Action/Plan:   PTA independent from home with wife.  Pt has chronic ostomy - supplies are delivered directly to the home.  Pt is active with [facility] for [facility] - pt would like to use same agency for HHPT (recommended).   CM requested [facility] orders from attending.  [facility] informed of pending referral   Expected Discharge Date:  07/04/17               Expected Discharge Plan:  [place]  In-House Referral:     Discharge planning Services  CM Consult  Post Acute Care Choice:    Choice offered to:     DME Arranged:  3-N-1 DME Agency:  [place] Arranged:  RN, PT, OT  [facility] Agency:  [facility])  Status of Service:  Completed, signed off  If discussed at [place] of Stay Meetings, dates discussed:    Additional Comments: Pt refused DME due having a ostomy and discharging home with foley (per attending urologist will manage foley)  CM offered choice for DME - pt chose [facility] - agency contacted and referral accepted.  Per pt his wife will provide recommended supervision at discharge Maryclare Labrador, RN 07/04/2017, 3:48 PM

## 2017-07-04 NOTE — Consult Note (Signed)
[facility] CM Primary Care Navigator  07/04/2017  Larry [name] 27-Oct-1950 [id number]   Met withpatientand wife Larry [name]) at the bedsideto identify possible discharge needs. Patientreports having"shortness of breath, dizziness and accelerated heart beats" thathad ledto this admission. He was found to have UTI and kidney stones.  Wife endorses Larry [name] with [facility] as theprimary care provider for patient.   [facility] [address] [facility] without any problem.   Patientstatesthathe manageshismedications at [employer] wife's assistance using "pill box" system filled once a week.  Patient reportsthat wife has beenprovidingtransportation tohisdoctors'appointments.  Patientliveswithwife who servesashisprimary caregiverat home.   Anticipated discharge plan ishomewith home health services from [place] according to patient's wife.  Patientand wifevoiced understanding to call primary care provider's office whenhereturns home for a post discharge follow-up appointment within1- 2 weeksor sooner if needs arise.Patient letter (with PCP's contact number) was provided astheirreminder.  Explained topatientand wiferegardingTHN CM services available for health management/ resourcesat home butpatient states that he is "good for now" and denies any current health concerns/ needs. He also declinedTHN CM services offered to him includingEMMI calls to follow-up with hisrecovery. Wifereportsthat they are both managing his health needs so far (mainly HF/ COPD). Patient/ wife expressed understanding to seekreferral to [facility] care managementfrom primary care providerasdeemed necessary and appropriate for anyservicesin the near future.   [facility] care management information provided for future needs that may arise.   For additional questions please  contact:  Edwena Felty A. Austynn Pridmore, BSN, RN-BC [facility] PRIMARY CARE Navigator Cell: [phone]

## 2017-07-05 ENCOUNTER — Telehealth: Payer: Self-pay | Admitting: Nurse Practitioner

## 2017-07-05 ENCOUNTER — Encounter (HOSPITAL_COMMUNITY): Payer: Self-pay | Admitting: Urology

## 2017-07-05 LAB — CULTURE, BLOOD (ROUTINE X 2)
Culture: NO GROWTH
Special Requests: ADEQUATE

## 2017-07-05 NOTE — Telephone Encounter (Signed)
Called pt's wife back, was just D/C from hospital yesterday and D/C papers stated to see Cecille Rubin in one week. Explained to pt's wife that Cecille Rubin is fully booked.  Pt's wife stated ask Cecille Rubin if this could wait.  Pt had a UTI and a catheter and is not able to stand yet.  Stated Cecille Rubin has openings today but is probably to soon. Will send to Cecille Rubin to advise than call pt's wife back.

## 2017-07-05 NOTE — Telephone Encounter (Signed)
S/w pt's wife is agreeable to moving appt out to one month.  Appt scheduled.

## 2017-07-05 NOTE — Telephone Encounter (Signed)
Needs to see his PCP and urology according to the discharge summary.   Can I see him in about a month?

## 2017-07-05 NOTE — Telephone Encounter (Signed)
New message:       Pt is calling to schedule an appt in 1 wk with Servando Snare but I see no available slot to put pt in.

## 2017-07-06 ENCOUNTER — Telehealth: Payer: Self-pay

## 2017-07-06 ENCOUNTER — Telehealth: Payer: Self-pay | Admitting: Nurse Practitioner

## 2017-07-06 DIAGNOSIS — Z48815 Encounter for surgical aftercare following surgery on the digestive system: Secondary | ICD-10-CM | POA: Diagnosis not present

## 2017-07-06 DIAGNOSIS — N183 Chronic kidney disease, stage 3 (moderate): Secondary | ICD-10-CM | POA: Diagnosis not present

## 2017-07-06 DIAGNOSIS — I5042 Chronic combined systolic (congestive) and diastolic (congestive) heart failure: Secondary | ICD-10-CM | POA: Diagnosis not present

## 2017-07-06 DIAGNOSIS — I2510 Atherosclerotic heart disease of native coronary artery without angina pectoris: Secondary | ICD-10-CM | POA: Diagnosis not present

## 2017-07-06 DIAGNOSIS — I130 Hypertensive heart and chronic kidney disease with heart failure and stage 1 through stage 4 chronic kidney disease, or unspecified chronic kidney disease: Secondary | ICD-10-CM | POA: Diagnosis not present

## 2017-07-06 DIAGNOSIS — J449 Chronic obstructive pulmonary disease, unspecified: Secondary | ICD-10-CM | POA: Diagnosis not present

## 2017-07-06 NOTE — Telephone Encounter (Signed)
New Message   Langley Gauss with [facility] is calling on behalf of patient. She states that he is feeling up the illeostomy bag about 500 every three hours. His BP has been 100/70 and HR 115. She was calling to report it.   Pt c/o medication issue:  1. Name of Medication: lasix  2. How are you currently taking this medication (dosage and times per day)?   3. Are you having a reaction (difficulty breathing--STAT)?   4. What is your medication issue? Due to the output of urination they are going to push back the lasix. Please call.

## 2017-07-06 NOTE — Telephone Encounter (Signed)
Returned call to [place] with [facility].  Langley Gauss concerned with Pt urine output.  Clarified DC medication orders with [name]-corrected lasix (60 mg instead of 80 mg once daily) and metoprolol doses.  [name] aware to hold spironolactone until follow up with office. [name] to have Pt increase fluid intake.  This nurse advised to monitor for sepsis.  Pt on antibiotics at this time. Advised if urine output continues to be high to contact urologist.  Langley Gauss asking if cardiology will sign Pt [place] orders.  Langley Gauss states she will go to wound care Dr. Hulen Skains as needed and urologist for orders pertinent to those symptoms but she is most concerned about patient's cardiac symptoms.   Advised will forward to Pt's doctor for further advisement.

## 2017-07-06 NOTE — Telephone Encounter (Signed)
err

## 2017-07-07 LAB — CULTURE, BLOOD (ROUTINE X 2)
CULTURE: NO GROWTH
CULTURE: NO GROWTH
Special Requests: ADEQUATE

## 2017-07-07 NOTE — Telephone Encounter (Signed)
S/w Langley Gauss with [facility] stated pt received HH orders from Dr.Wyatt due to ileostomy over a year ago.   Pt does have a Foley and is supposed to see Urologist next week to get this taken out.  Pt received foley due to septic, UTI, and kidney stones.  Stated has not seen PCP in over a year and is compliant only with cardiac office.  Stated Cecille Rubin does not typically do [place] orders but will ask on Monday, stated best bet would be Dr. Hulen Skains.  Stated asked this office for [facility] due to more cardiac issues than anything. Stated will send to Cecille Rubin to review on Monday and will call back [place] on Monday with HH.

## 2017-07-10 DIAGNOSIS — Z48815 Encounter for surgical aftercare following surgery on the digestive system: Secondary | ICD-10-CM | POA: Diagnosis not present

## 2017-07-10 DIAGNOSIS — I5042 Chronic combined systolic (congestive) and diastolic (congestive) heart failure: Secondary | ICD-10-CM | POA: Diagnosis not present

## 2017-07-10 DIAGNOSIS — I2510 Atherosclerotic heart disease of native coronary artery without angina pectoris: Secondary | ICD-10-CM | POA: Diagnosis not present

## 2017-07-10 DIAGNOSIS — I130 Hypertensive heart and chronic kidney disease with heart failure and stage 1 through stage 4 chronic kidney disease, or unspecified chronic kidney disease: Secondary | ICD-10-CM | POA: Diagnosis not present

## 2017-07-10 DIAGNOSIS — J449 Chronic obstructive pulmonary disease, unspecified: Secondary | ICD-10-CM | POA: Diagnosis not present

## 2017-07-10 DIAGNOSIS — N183 Chronic kidney disease, stage 3 (moderate): Secondary | ICD-10-CM | POA: Diagnosis not present

## 2017-07-10 NOTE — Telephone Encounter (Signed)
S/w Langley Gauss from [facility] stated needed orders for one week than going back to Dr. Hulen Skains, [facility] is for pt to learn how to empty foley,  this was not discussed previously.  Langley Gauss stated cannot cut out certain pieces of [place] just for cardiac.  Stated thank you and will try someone else. Will send to [place] to [place].

## 2017-07-10 NOTE — Telephone Encounter (Signed)
I will be happy to write orders for his cardiac issues - I cannot do general medical care. He needs to get back to his PCP - maybe Home Health could help facilitate this.

## 2017-07-12 DIAGNOSIS — I130 Hypertensive heart and chronic kidney disease with heart failure and stage 1 through stage 4 chronic kidney disease, or unspecified chronic kidney disease: Secondary | ICD-10-CM | POA: Diagnosis not present

## 2017-07-12 DIAGNOSIS — N183 Chronic kidney disease, stage 3 (moderate): Secondary | ICD-10-CM | POA: Diagnosis not present

## 2017-07-12 DIAGNOSIS — J449 Chronic obstructive pulmonary disease, unspecified: Secondary | ICD-10-CM | POA: Diagnosis not present

## 2017-07-12 DIAGNOSIS — I2510 Atherosclerotic heart disease of native coronary artery without angina pectoris: Secondary | ICD-10-CM | POA: Diagnosis not present

## 2017-07-12 DIAGNOSIS — I5042 Chronic combined systolic (congestive) and diastolic (congestive) heart failure: Secondary | ICD-10-CM | POA: Diagnosis not present

## 2017-07-12 DIAGNOSIS — Z48815 Encounter for surgical aftercare following surgery on the digestive system: Secondary | ICD-10-CM | POA: Diagnosis not present

## 2017-07-14 DIAGNOSIS — N183 Chronic kidney disease, stage 3 (moderate): Secondary | ICD-10-CM | POA: Diagnosis not present

## 2017-07-14 DIAGNOSIS — I2510 Atherosclerotic heart disease of native coronary artery without angina pectoris: Secondary | ICD-10-CM | POA: Diagnosis not present

## 2017-07-14 DIAGNOSIS — J449 Chronic obstructive pulmonary disease, unspecified: Secondary | ICD-10-CM | POA: Diagnosis not present

## 2017-07-14 DIAGNOSIS — I130 Hypertensive heart and chronic kidney disease with heart failure and stage 1 through stage 4 chronic kidney disease, or unspecified chronic kidney disease: Secondary | ICD-10-CM | POA: Diagnosis not present

## 2017-07-14 DIAGNOSIS — Z48815 Encounter for surgical aftercare following surgery on the digestive system: Secondary | ICD-10-CM | POA: Diagnosis not present

## 2017-07-14 DIAGNOSIS — I5042 Chronic combined systolic (congestive) and diastolic (congestive) heart failure: Secondary | ICD-10-CM | POA: Diagnosis not present

## 2017-07-18 ENCOUNTER — Other Ambulatory Visit: Payer: Self-pay | Admitting: *Deleted

## 2017-07-18 ENCOUNTER — Telehealth: Payer: Self-pay | Admitting: Nurse Practitioner

## 2017-07-18 DIAGNOSIS — Z466 Encounter for fitting and adjustment of urinary device: Secondary | ICD-10-CM | POA: Diagnosis not present

## 2017-07-18 DIAGNOSIS — I5022 Chronic systolic (congestive) heart failure: Secondary | ICD-10-CM

## 2017-07-18 DIAGNOSIS — Z932 Ileostomy status: Secondary | ICD-10-CM | POA: Diagnosis not present

## 2017-07-18 DIAGNOSIS — N390 Urinary tract infection, site not specified: Secondary | ICD-10-CM | POA: Diagnosis not present

## 2017-07-18 DIAGNOSIS — I5042 Chronic combined systolic (congestive) and diastolic (congestive) heart failure: Secondary | ICD-10-CM | POA: Diagnosis not present

## 2017-07-18 DIAGNOSIS — N183 Chronic kidney disease, stage 3 (moderate): Secondary | ICD-10-CM | POA: Diagnosis not present

## 2017-07-18 DIAGNOSIS — J449 Chronic obstructive pulmonary disease, unspecified: Secondary | ICD-10-CM | POA: Diagnosis not present

## 2017-07-18 DIAGNOSIS — I4891 Unspecified atrial fibrillation: Secondary | ICD-10-CM | POA: Diagnosis not present

## 2017-07-18 DIAGNOSIS — R339 Retention of urine, unspecified: Secondary | ICD-10-CM | POA: Diagnosis not present

## 2017-07-18 DIAGNOSIS — E7800 Pure hypercholesterolemia, unspecified: Secondary | ICD-10-CM

## 2017-07-18 DIAGNOSIS — Z433 Encounter for attention to colostomy: Secondary | ICD-10-CM | POA: Diagnosis not present

## 2017-07-18 DIAGNOSIS — I255 Ischemic cardiomyopathy: Secondary | ICD-10-CM | POA: Diagnosis not present

## 2017-07-18 DIAGNOSIS — I259 Chronic ischemic heart disease, unspecified: Secondary | ICD-10-CM

## 2017-07-18 DIAGNOSIS — Z85038 Personal history of other malignant neoplasm of large intestine: Secondary | ICD-10-CM | POA: Diagnosis not present

## 2017-07-18 DIAGNOSIS — I2510 Atherosclerotic heart disease of native coronary artery without angina pectoris: Secondary | ICD-10-CM | POA: Diagnosis not present

## 2017-07-18 DIAGNOSIS — Z7902 Long term (current) use of antithrombotics/antiplatelets: Secondary | ICD-10-CM | POA: Diagnosis not present

## 2017-07-18 DIAGNOSIS — I739 Peripheral vascular disease, unspecified: Secondary | ICD-10-CM | POA: Diagnosis not present

## 2017-07-18 DIAGNOSIS — Z792 Long term (current) use of antibiotics: Secondary | ICD-10-CM | POA: Diagnosis not present

## 2017-07-18 DIAGNOSIS — E669 Obesity, unspecified: Secondary | ICD-10-CM | POA: Diagnosis not present

## 2017-07-18 DIAGNOSIS — Z6841 Body Mass Index (BMI) 40.0 and over, adult: Secondary | ICD-10-CM | POA: Diagnosis not present

## 2017-07-18 DIAGNOSIS — Z951 Presence of aortocoronary bypass graft: Secondary | ICD-10-CM | POA: Diagnosis not present

## 2017-07-18 DIAGNOSIS — Z48815 Encounter for surgical aftercare following surgery on the digestive system: Secondary | ICD-10-CM | POA: Diagnosis not present

## 2017-07-18 DIAGNOSIS — Z960 Presence of urogenital implants: Secondary | ICD-10-CM | POA: Diagnosis not present

## 2017-07-18 DIAGNOSIS — R0602 Shortness of breath: Secondary | ICD-10-CM

## 2017-07-18 DIAGNOSIS — B9689 Other specified bacterial agents as the cause of diseases classified elsewhere: Secondary | ICD-10-CM | POA: Diagnosis not present

## 2017-07-18 DIAGNOSIS — I130 Hypertensive heart and chronic kidney disease with heart failure and stage 1 through stage 4 chronic kidney disease, or unspecified chronic kidney disease: Secondary | ICD-10-CM | POA: Diagnosis not present

## 2017-07-18 DIAGNOSIS — I471 Supraventricular tachycardia: Secondary | ICD-10-CM

## 2017-07-18 MED ORDER — DILTIAZEM HCL 60 MG PO TABS: 60.0000 mg | ORAL_TABLET | Freq: Three times a day (TID) | ORAL | 3 refills | Status: DC

## 2017-07-18 NOTE — Telephone Encounter (Signed)
Langley Gauss, RN is calling for patient. She is on a home visit.  She states the patient's HR this morning is 125 bpm. Last Friday it was 110 bpm. She reports no symptoms other than he "may lack a little energy." He denies racing heart or palpitations. Confirmed medications with Langley Gauss. He is taking metoprolol 37.5 mg BID and Cardizem 30 mg TID. His BP this morning "was a little high" at 122/78.  Langley Gauss requests a call back at [phone] to review recommendations.   Will review with Cecille Rubin and call back.

## 2017-07-18 NOTE — Telephone Encounter (Signed)
Spoke with Truitt Merle, NP. Patient is to INCREASE CARDIZEM to 60 mg TID.  Left message for Langley Gauss to call back.

## 2017-07-18 NOTE — Telephone Encounter (Signed)
Follow Up:      Returning your call from today. 

## 2017-07-18 NOTE — Telephone Encounter (Signed)
New Message   Langley Gauss with [facility] is calling on behalf of patient.   STAT if HR is under 50 or over 120 (normal HR is 60-100 beats per minute)  1) What is your heart rate? 125 today   2) Do you have a log of your heart rate readings (document readings)? Friday 110  3) Do you have any other symptoms? No symptoms    Pt c/o medication issue:  1. Name of Medication: diltiazem (CARDIZEM) 30 MG tablet  2. How are you currently taking this medication (dosage and times per day)? 30 mg three times a day   3. Are you having a reaction (difficulty breathing--STAT)?   4. What is your medication issue? Langley Gauss believes its causing the hr to go up .

## 2017-07-18 NOTE — Telephone Encounter (Signed)
S/w Langley Gauss from [facility] is aware of Lori's recommendation's, medication list updated and new refill sent in for Cardizem (60 mg ) tid.

## 2017-07-19 DIAGNOSIS — B9689 Other specified bacterial agents as the cause of diseases classified elsewhere: Secondary | ICD-10-CM | POA: Diagnosis not present

## 2017-07-19 DIAGNOSIS — Z48815 Encounter for surgical aftercare following surgery on the digestive system: Secondary | ICD-10-CM | POA: Diagnosis not present

## 2017-07-19 DIAGNOSIS — N390 Urinary tract infection, site not specified: Secondary | ICD-10-CM | POA: Diagnosis not present

## 2017-07-19 DIAGNOSIS — Z466 Encounter for fitting and adjustment of urinary device: Secondary | ICD-10-CM | POA: Diagnosis not present

## 2017-07-19 DIAGNOSIS — Z433 Encounter for attention to colostomy: Secondary | ICD-10-CM | POA: Diagnosis not present

## 2017-07-19 DIAGNOSIS — R339 Retention of urine, unspecified: Secondary | ICD-10-CM | POA: Diagnosis not present

## 2017-07-19 DIAGNOSIS — N201 Calculus of ureter: Secondary | ICD-10-CM | POA: Diagnosis not present

## 2017-07-20 DIAGNOSIS — N323 Diverticulum of bladder: Secondary | ICD-10-CM | POA: Diagnosis not present

## 2017-07-21 ENCOUNTER — Telehealth: Payer: Self-pay

## 2017-07-21 DIAGNOSIS — R339 Retention of urine, unspecified: Secondary | ICD-10-CM | POA: Diagnosis not present

## 2017-07-21 DIAGNOSIS — Z48815 Encounter for surgical aftercare following surgery on the digestive system: Secondary | ICD-10-CM | POA: Diagnosis not present

## 2017-07-21 DIAGNOSIS — N390 Urinary tract infection, site not specified: Secondary | ICD-10-CM | POA: Diagnosis not present

## 2017-07-21 DIAGNOSIS — B9689 Other specified bacterial agents as the cause of diseases classified elsewhere: Secondary | ICD-10-CM | POA: Diagnosis not present

## 2017-07-21 DIAGNOSIS — Z433 Encounter for attention to colostomy: Secondary | ICD-10-CM | POA: Diagnosis not present

## 2017-07-21 DIAGNOSIS — Z466 Encounter for fitting and adjustment of urinary device: Secondary | ICD-10-CM | POA: Diagnosis not present

## 2017-07-21 NOTE — Telephone Encounter (Signed)
See telephone encounter from 07/21/17.

## 2017-07-21 NOTE — Telephone Encounter (Signed)
Larry [name]  07/21/17 9:27 AM  Note    Langley Gauss from [place] is calling.   Stated pt's Diltiazem was increase from 30mg  to 60mg  3 times daily and his heart rate is still in the 120. Please advise.

## 2017-07-21 NOTE — Telephone Encounter (Signed)
[name] from [place] is calling.   Stated pt's Diltiazem was increase from 30mg  to 60mg  3 times daily and his heart rate is still in the 120. Please advise.

## 2017-07-21 NOTE — Telephone Encounter (Signed)
Spoke with Langley Gauss from [place] who states that the patient's diltiazem was recently increased from 30 mg TID to 60 mg TID. She states that the patient's HR is still elevated at 117-123. She states that his BP was 118/68 and SpO2 95-97%. She states that the patient has been taking the diltiazem 60 mg TID and metoprolol 37.5 mg BID. She states that the patient is completely asymptomatic. Denies chest pain, palpitations, SOB, or any other symptoms. She states that the patient's foley was d/c'd yesterday. Made [name] aware that I would forward the information for review and if any medication changes are recommended then we would call back. Instructed her to have the patient call if he becomes symptomatic. Langley Gauss verbalized understanding and thanked me for the call.

## 2017-07-22 NOTE — Telephone Encounter (Signed)
Prior to this most recent admission, he was taking 90 mg of Cardizem three times a day - let's get him back on this dose.   Would continue with current dose of Metoprolol

## 2017-07-24 ENCOUNTER — Telehealth: Payer: Self-pay | Admitting: Nurse Practitioner

## 2017-07-24 ENCOUNTER — Other Ambulatory Visit: Payer: Self-pay | Admitting: *Deleted

## 2017-07-24 ENCOUNTER — Telehealth: Payer: Self-pay | Admitting: *Deleted

## 2017-07-24 DIAGNOSIS — Z466 Encounter for fitting and adjustment of urinary device: Secondary | ICD-10-CM | POA: Diagnosis not present

## 2017-07-24 DIAGNOSIS — I259 Chronic ischemic heart disease, unspecified: Secondary | ICD-10-CM

## 2017-07-24 DIAGNOSIS — R0602 Shortness of breath: Secondary | ICD-10-CM

## 2017-07-24 DIAGNOSIS — I471 Supraventricular tachycardia: Secondary | ICD-10-CM

## 2017-07-24 DIAGNOSIS — Z48815 Encounter for surgical aftercare following surgery on the digestive system: Secondary | ICD-10-CM | POA: Diagnosis not present

## 2017-07-24 DIAGNOSIS — E7800 Pure hypercholesterolemia, unspecified: Secondary | ICD-10-CM

## 2017-07-24 DIAGNOSIS — I5022 Chronic systolic (congestive) heart failure: Secondary | ICD-10-CM

## 2017-07-24 DIAGNOSIS — R339 Retention of urine, unspecified: Secondary | ICD-10-CM | POA: Diagnosis not present

## 2017-07-24 DIAGNOSIS — Z433 Encounter for attention to colostomy: Secondary | ICD-10-CM | POA: Diagnosis not present

## 2017-07-24 DIAGNOSIS — B9689 Other specified bacterial agents as the cause of diseases classified elsewhere: Secondary | ICD-10-CM | POA: Diagnosis not present

## 2017-07-24 DIAGNOSIS — N390 Urinary tract infection, site not specified: Secondary | ICD-10-CM | POA: Diagnosis not present

## 2017-07-24 MED ORDER — DILTIAZEM HCL 90 MG PO TABS: 90.0000 mg | ORAL_TABLET | Freq: Three times a day (TID) | ORAL | 9 refills | Status: DC

## 2017-07-24 NOTE — Telephone Encounter (Signed)
[place] an Recruitment consultant from [facility], states the pt has completed his home health evaluation and does not need anymore occupational therapy. Please call with any questions or concerns

## 2017-07-24 NOTE — Telephone Encounter (Signed)
S/w [name] from [facility] and will increase pt's cardizem (90 mg ) tid, medication list updated.

## 2017-07-24 NOTE — Telephone Encounter (Signed)
Faxed to [facility] [phone] the # is [phone]  Pt's orders for diltiazem but the order just changed today.  See previous phone note.

## 2017-07-25 DIAGNOSIS — Z48815 Encounter for surgical aftercare following surgery on the digestive system: Secondary | ICD-10-CM | POA: Diagnosis not present

## 2017-07-25 DIAGNOSIS — Z433 Encounter for attention to colostomy: Secondary | ICD-10-CM | POA: Diagnosis not present

## 2017-07-25 DIAGNOSIS — N390 Urinary tract infection, site not specified: Secondary | ICD-10-CM | POA: Diagnosis not present

## 2017-07-25 DIAGNOSIS — Z466 Encounter for fitting and adjustment of urinary device: Secondary | ICD-10-CM | POA: Diagnosis not present

## 2017-07-25 DIAGNOSIS — B9689 Other specified bacterial agents as the cause of diseases classified elsewhere: Secondary | ICD-10-CM | POA: Diagnosis not present

## 2017-07-25 DIAGNOSIS — R339 Retention of urine, unspecified: Secondary | ICD-10-CM | POA: Diagnosis not present

## 2017-07-25 NOTE — Telephone Encounter (Signed)
Made Lori aware.

## 2017-07-26 ENCOUNTER — Telehealth: Payer: Self-pay | Admitting: Nurse Practitioner

## 2017-07-26 ENCOUNTER — Other Ambulatory Visit: Payer: Self-pay | Admitting: Urology

## 2017-07-26 NOTE — Telephone Encounter (Signed)
The patient is high risk for any procedures. I do not feel that further CV testing would be beneficial at this time.  This appears to be a relatively low risk procedure however.  If medically indicated proceed.   Hold ASA and plavix x 5 days and resume post procedure.

## 2017-07-26 NOTE — Telephone Encounter (Signed)
New message       [facility] Pre-operative Risk Assessment    Request for surgical clearance:  1. What type of surgery is being performed? Ureteroscopy and bladder stone  2. When is this surgery scheduled? 08/02/17  3. What type of clearance is required (medical clearance vs. Pharmacy clearance to hold med vs. Both)? Both medical and aspirin regimen  4. Are there any medications that need to be held prior to surgery and how long? Hold aspirin 5 days   5. Practice name and name of physician performing surgery?  [facility]   6. What is your office phone number [phone]   7.   What is your office fax number [phone] 8.   Anesthesia type (None, local, MAC, general) ?  General    Larry [name] 07/26/2017, 10:08 AM  _________________________________________________________________   (provider comments below)

## 2017-07-26 NOTE — Telephone Encounter (Signed)
   Primary Cardiologist: Gerhardt/Allred  Chart reviewed as part of pre-operative protocol coverage. Patient was contacted 07/26/2017 in reference to pre-operative risk assessment for pending surgery as outlined below.  Larry [name] was last seen 04/2017 by Dr. Rayann Heman - VERY complex history. CAD prior stenting in 2010, 2016, chronic combined CHF, HTN, HLD, morbid obesity, longstanding atrial tach, colon CA with colectomy and recovery complicated by cardiac arrest/torsades in setting of QT prolongation with hypokalemia and hypomagnesemia implicated as the case. Severe physical deconditioning noted. In talking to Larry [name], he is extremely sedentary and in general unable to complete a lot of activity. He also has a h/o stay at [facility] for respiratory failure. More recently he was admitted 06/2017 for AKI on CKD, sepsis due to UTI, kidney stone and hydronephrosis. Several meds were changed. Interim telephone notes indicate getting the patient back on previously dosed higher dose of diltiazem due to elevated heart rates.  In speaking with Larry [name], she feels he would be high risk regardless. I will reach out to Dr. Rayann Heman regarding his input on surgical clearance, as well as holding of ASA/Plavix. Last PCI 2016. Dr. Rayann Heman, please route your reply to either me or P CV DIV PREOP (I am preop today and tomorrow).  [name], PA-C 07/26/2017, 1:52 PM

## 2017-07-27 DIAGNOSIS — B9689 Other specified bacterial agents as the cause of diseases classified elsewhere: Secondary | ICD-10-CM | POA: Diagnosis not present

## 2017-07-27 DIAGNOSIS — R339 Retention of urine, unspecified: Secondary | ICD-10-CM | POA: Diagnosis not present

## 2017-07-27 DIAGNOSIS — Z466 Encounter for fitting and adjustment of urinary device: Secondary | ICD-10-CM | POA: Diagnosis not present

## 2017-07-27 DIAGNOSIS — Z48815 Encounter for surgical aftercare following surgery on the digestive system: Secondary | ICD-10-CM | POA: Diagnosis not present

## 2017-07-27 DIAGNOSIS — N390 Urinary tract infection, site not specified: Secondary | ICD-10-CM | POA: Diagnosis not present

## 2017-07-27 DIAGNOSIS — Z433 Encounter for attention to colostomy: Secondary | ICD-10-CM | POA: Diagnosis not present

## 2017-07-27 NOTE — Telephone Encounter (Signed)
   Primary Cardiologist: Gerhardt/Allred Chart reviewed as part of pre-operative protocol coverage. See below outlining history.   Per Dr. Rayann Heman, "The patient is high risk for any procedures. I do not feel that further CV testing would be beneficial at this time.  This appears to be a relatively low risk procedure however.  If medically indicated proceed.   Hold ASA and plavix x 5 days and resume post procedure." I reached out to the patient who states he is asymptomatic and feeling fine. Discussed surgical risk with patient. He said that surgery only gave him instructions on the ASA, so I asked him to reach out to their office to discuss the Plavix as well as that was not included on our intake request.  I will route this recommendation to the requesting party via [facility] fax function and remove from pre-op pool.  Please call with questions.  [name], PA-C 07/27/2017, 7:51 AM

## 2017-07-28 DIAGNOSIS — Z48815 Encounter for surgical aftercare following surgery on the digestive system: Secondary | ICD-10-CM | POA: Diagnosis not present

## 2017-07-28 DIAGNOSIS — R339 Retention of urine, unspecified: Secondary | ICD-10-CM | POA: Diagnosis not present

## 2017-07-28 DIAGNOSIS — B9689 Other specified bacterial agents as the cause of diseases classified elsewhere: Secondary | ICD-10-CM | POA: Diagnosis not present

## 2017-07-28 DIAGNOSIS — Z466 Encounter for fitting and adjustment of urinary device: Secondary | ICD-10-CM | POA: Diagnosis not present

## 2017-07-28 DIAGNOSIS — Z433 Encounter for attention to colostomy: Secondary | ICD-10-CM | POA: Diagnosis not present

## 2017-07-28 DIAGNOSIS — N390 Urinary tract infection, site not specified: Secondary | ICD-10-CM | POA: Diagnosis not present

## 2017-07-28 NOTE — Patient Instructions (Addendum)
Larry [name]  07/28/2017   Your procedure is scheduled on: 08-02-17   Report to [facility] Main  Entrance    Report to Admitting at 6:30 AM    Call this number if you have problems the morning of surgery [phone]   Remember: Do not eat food or drink liquids :After Midnight.     Take these medicines the morning of surgery with A SIP OF WATER: Metoprolol Tartrate (Lopresssor). You may also bring and use your inhaler as needed.                                You may not have any metal on your body including hair pins and              piercings  Do not wear jewelry, lotions, powders or deodorant             Men may shave face and neck.   Do not bring valuables to the hospital. [place].  Contacts, dentures or bridgework may not be worn into surgery.     Patients discharged the day of surgery will not be allowed to drive home.  Name and phone number of your driver:Gail Shall [phone]  Special Instructions: Please bring your mask and tubing for your CPAP machine              Please read over the following fact sheets you were given: _____________________________________________________________________          [facility] - Preparing for Surgery Before surgery, you can play an important role.  Because skin is not sterile, your skin needs to be as free of germs as possible.  You can reduce the number of germs on your skin by washing with CHG (chlorahexidine gluconate) soap before surgery.  CHG is an antiseptic cleaner which kills germs and bonds with the skin to continue killing germs even after washing. Please DO NOT use if you have an allergy to CHG or antibacterial soaps.  If your skin becomes reddened/irritated stop using the CHG and inform your nurse when you arrive at Short Stay. Do not shave (including legs and underarms) for at least 48 hours prior to the first CHG shower.  You may shave your  face/neck. Please follow these instructions carefully:  1.  Shower with CHG Soap the night before surgery and the  morning of Surgery.  2.  If you choose to wash your hair, wash your hair first as usual with your  normal  shampoo.  3.  After you shampoo, rinse your hair and body thoroughly to remove the  shampoo.                           4.  Use CHG as you would any other liquid soap.  You can apply chg directly  to the skin and wash                       Gently with a scrungie or clean washcloth.  5.  Apply the CHG Soap to your body ONLY FROM THE NECK DOWN.   Do not use on face/ open  Wound or open sores. Avoid contact with eyes, ears mouth and genitals (private parts).                       Wash face,  Genitals (private parts) with your normal soap.             6.  Wash thoroughly, paying special attention to the area where your surgery  will be performed.  7.  Thoroughly rinse your body with warm water from the neck down.  8.  DO NOT shower/wash with your normal soap after using and rinsing off  the CHG Soap.                9.  Pat yourself dry with a clean towel.            10.  Wear clean pajamas.            11.  Place clean sheets on your bed the night of your first shower and do not  sleep with pets. Day of Surgery : Do not apply any lotions/deodorants the morning of surgery.  Please wear clean clothes to the hospital/surgery center.  FAILURE TO FOLLOW THESE INSTRUCTIONS MAY RESULT IN THE CANCELLATION OF YOUR SURGERY PATIENT SIGNATURE_________________________________  NURSE SIGNATURE__________________________________  ________________________________________________________________________

## 2017-07-28 NOTE — Progress Notes (Signed)
07-26-17 (Epic) Cardiac Clearance in Telephone Enounter Dr. Rayann Heman  06-30-17 (Epic) EKG, CXR  04-16-16 (Epic)  ECHO  01-26-16 (Epic) Stress

## 2017-07-31 ENCOUNTER — Encounter (HOSPITAL_COMMUNITY)
Admission: RE | Admit: 2017-07-31 | Discharge: 2017-07-31 | Disposition: A | Discharge status: Home / Self Care | Payer: Medicare Other | Source: Ambulatory Visit | Attending: Urology | Admitting: Urology

## 2017-07-31 ENCOUNTER — Encounter (HOSPITAL_COMMUNITY): Payer: Self-pay

## 2017-07-31 ENCOUNTER — Other Ambulatory Visit: Payer: Self-pay

## 2017-07-31 DIAGNOSIS — Z85038 Personal history of other malignant neoplasm of large intestine: Secondary | ICD-10-CM | POA: Diagnosis not present

## 2017-07-31 DIAGNOSIS — I2510 Atherosclerotic heart disease of native coronary artery without angina pectoris: Secondary | ICD-10-CM | POA: Diagnosis not present

## 2017-07-31 DIAGNOSIS — N201 Calculus of ureter: Secondary | ICD-10-CM | POA: Diagnosis not present

## 2017-07-31 DIAGNOSIS — G4730 Sleep apnea, unspecified: Secondary | ICD-10-CM | POA: Diagnosis not present

## 2017-07-31 DIAGNOSIS — Z7982 Long term (current) use of aspirin: Secondary | ICD-10-CM | POA: Diagnosis not present

## 2017-07-31 DIAGNOSIS — Z7902 Long term (current) use of antithrombotics/antiplatelets: Secondary | ICD-10-CM | POA: Diagnosis not present

## 2017-07-31 DIAGNOSIS — I110 Hypertensive heart disease with heart failure: Secondary | ICD-10-CM | POA: Diagnosis not present

## 2017-07-31 DIAGNOSIS — I509 Heart failure, unspecified: Secondary | ICD-10-CM | POA: Diagnosis not present

## 2017-07-31 DIAGNOSIS — M1990 Unspecified osteoarthritis, unspecified site: Secondary | ICD-10-CM | POA: Diagnosis not present

## 2017-07-31 DIAGNOSIS — J449 Chronic obstructive pulmonary disease, unspecified: Secondary | ICD-10-CM | POA: Diagnosis not present

## 2017-07-31 DIAGNOSIS — Z87891 Personal history of nicotine dependence: Secondary | ICD-10-CM | POA: Diagnosis not present

## 2017-07-31 DIAGNOSIS — Z79899 Other long term (current) drug therapy: Secondary | ICD-10-CM | POA: Diagnosis not present

## 2017-07-31 DIAGNOSIS — Z955 Presence of coronary angioplasty implant and graft: Secondary | ICD-10-CM | POA: Diagnosis not present

## 2017-07-31 DIAGNOSIS — N210 Calculus in bladder: Secondary | ICD-10-CM | POA: Diagnosis not present

## 2017-07-31 DIAGNOSIS — Z6841 Body Mass Index (BMI) 40.0 and over, adult: Secondary | ICD-10-CM | POA: Diagnosis not present

## 2017-07-31 DIAGNOSIS — N323 Diverticulum of bladder: Secondary | ICD-10-CM | POA: Diagnosis not present

## 2017-07-31 LAB — BASIC METABOLIC PANEL
Anion gap: 10 (ref 5–15)
BUN: 21 mg/dL — ABNORMAL HIGH (ref 6–20)
CALCIUM: 9.7 mg/dL (ref 8.9–10.3)
CO2: 22 mmol/L (ref 22–32)
Chloride: 109 mmol/L (ref 101–111)
Creatinine, Ser: 1.85 mg/dL — ABNORMAL HIGH (ref 0.61–1.24)
GFR calc Af Amer: 43 mL/min — ABNORMAL LOW (ref >60)
GFR, EST NON AFRICAN AMERICAN: 37 mL/min — AB (ref >60)
Glucose, Bld: 95 mg/dL (ref 65–99)
POTASSIUM: 3.6 mmol/L (ref 3.5–5.1)
SODIUM: 141 mmol/L (ref 135–145)

## 2017-07-31 LAB — CBC
HEMATOCRIT: 35.5 % — AB (ref 39.0–52.0)
Hemoglobin: 11.1 g/dL — ABNORMAL LOW (ref 13.0–17.0)
MCH: 28.3 pg (ref 26.0–34.0)
MCHC: 31.3 g/dL (ref 30.0–36.0)
MCV: 90.6 fL (ref 78.0–100.0)
PLATELETS: 135 10*3/uL — AB (ref 150–400)
RBC: 3.92 MIL/uL — ABNORMAL LOW (ref 4.22–5.81)
RDW: 15.4 % (ref 11.5–15.5)
WBC: 6.1 10*3/uL (ref 4.0–10.5)

## 2017-07-31 NOTE — Progress Notes (Signed)
07-31-17 BMP result routed to Dr. Gloriann Loan for review.

## 2017-08-01 DIAGNOSIS — B9689 Other specified bacterial agents as the cause of diseases classified elsewhere: Secondary | ICD-10-CM | POA: Diagnosis not present

## 2017-08-01 DIAGNOSIS — N390 Urinary tract infection, site not specified: Secondary | ICD-10-CM | POA: Diagnosis not present

## 2017-08-01 DIAGNOSIS — Z466 Encounter for fitting and adjustment of urinary device: Secondary | ICD-10-CM | POA: Diagnosis not present

## 2017-08-01 DIAGNOSIS — Z48815 Encounter for surgical aftercare following surgery on the digestive system: Secondary | ICD-10-CM | POA: Diagnosis not present

## 2017-08-01 DIAGNOSIS — Z433 Encounter for attention to colostomy: Secondary | ICD-10-CM | POA: Diagnosis not present

## 2017-08-01 DIAGNOSIS — R339 Retention of urine, unspecified: Secondary | ICD-10-CM | POA: Diagnosis not present

## 2017-08-01 NOTE — Anesthesia Preprocedure Evaluation (Addendum)
Anesthesia Evaluation  Patient identified by MRN, date of birth, ID band Patient awake    Reviewed: Allergy & Precautions, NPO status , Patient's Chart, lab work & pertinent test results  Airway Mallampati: III  TM Distance: >3 FB Neck ROM: Full    Dental no notable dental hx. (+) Teeth Intact, Dental Advisory Given, Chipped,    Pulmonary sleep apnea , COPD, former smoker,    Pulmonary exam normal breath sounds clear to auscultation       Cardiovascular hypertension, + CAD  Normal cardiovascular exam Rhythm:Regular Rate:Normal     Neuro/Psych negative neurological ROS     GI/Hepatic negative GI ROS, Neg liver ROS,   Endo/Other  Morbid obesity  Renal/GU negative Renal ROS     Musculoskeletal   Abdominal (+) + obese,   Peds  Hematology  (+) anemia ,   Anesthesia Other Findings   Reproductive/Obstetrics                           Lab Results  Component Value Date   CREATININE 1.85 (H) 07/31/2017   BUN 21 (H) 07/31/2017   NA 141 07/31/2017   K 3.6 07/31/2017   CL 109 07/31/2017   CO2 22 07/31/2017    Lab Results  Component Value Date   WBC 6.1 07/31/2017   HGB 11.1 (L) 07/31/2017   HCT 35.5 (L) 07/31/2017   MCV 90.6 07/31/2017   PLT 135 (L) 07/31/2017    Anesthesia Physical Anesthesia Plan  ASA: III  Anesthesia Plan: General   Post-op Pain Management:    Induction: Intravenous  PONV Risk Score and Plan: Treatment may vary due to age or medical condition  Airway Management Planned: LMA  Additional Equipment:   Intra-op Plan:   Post-operative Plan:   Informed Consent: I have reviewed the patients History and Physical, chart, labs and discussed the procedure including the risks, benefits and alternatives for the proposed anesthesia with the patient or authorized representative who has indicated his/her understanding and acceptance.     Plan Discussed with: CRNA and  Anesthesiologist  Anesthesia Plan Comments:         Anesthesia Quick Evaluation

## 2017-08-02 ENCOUNTER — Ambulatory Visit (HOSPITAL_COMMUNITY): Payer: Medicare Other | Admitting: Anesthesiology

## 2017-08-02 ENCOUNTER — Ambulatory Visit (HOSPITAL_COMMUNITY)
Admission: RE | Admit: 2017-08-02 | Discharge: 2017-08-02 | Disposition: A | Discharge status: Home / Self Care | Payer: Medicare Other | Source: Ambulatory Visit | Attending: Urology | Admitting: Urology

## 2017-08-02 ENCOUNTER — Encounter (HOSPITAL_COMMUNITY)
Admission: RE | Disposition: A | Discharge status: Home / Self Care | Payer: Self-pay | Source: Ambulatory Visit | Attending: Urology

## 2017-08-02 ENCOUNTER — Encounter (HOSPITAL_COMMUNITY): Payer: Self-pay | Admitting: *Deleted

## 2017-08-02 ENCOUNTER — Other Ambulatory Visit: Payer: Self-pay

## 2017-08-02 ENCOUNTER — Ambulatory Visit (HOSPITAL_COMMUNITY): Payer: Medicare Other

## 2017-08-02 DIAGNOSIS — Z7982 Long term (current) use of aspirin: Secondary | ICD-10-CM | POA: Insufficient documentation

## 2017-08-02 DIAGNOSIS — Z7902 Long term (current) use of antithrombotics/antiplatelets: Secondary | ICD-10-CM | POA: Insufficient documentation

## 2017-08-02 DIAGNOSIS — N323 Diverticulum of bladder: Secondary | ICD-10-CM | POA: Diagnosis not present

## 2017-08-02 DIAGNOSIS — G4730 Sleep apnea, unspecified: Secondary | ICD-10-CM | POA: Diagnosis not present

## 2017-08-02 DIAGNOSIS — I2510 Atherosclerotic heart disease of native coronary artery without angina pectoris: Secondary | ICD-10-CM | POA: Diagnosis not present

## 2017-08-02 DIAGNOSIS — Z85038 Personal history of other malignant neoplasm of large intestine: Secondary | ICD-10-CM | POA: Insufficient documentation

## 2017-08-02 DIAGNOSIS — M1990 Unspecified osteoarthritis, unspecified site: Secondary | ICD-10-CM | POA: Diagnosis not present

## 2017-08-02 DIAGNOSIS — Z87891 Personal history of nicotine dependence: Secondary | ICD-10-CM | POA: Insufficient documentation

## 2017-08-02 DIAGNOSIS — Z6841 Body Mass Index (BMI) 40.0 and over, adult: Secondary | ICD-10-CM | POA: Insufficient documentation

## 2017-08-02 DIAGNOSIS — J449 Chronic obstructive pulmonary disease, unspecified: Secondary | ICD-10-CM | POA: Insufficient documentation

## 2017-08-02 DIAGNOSIS — I4891 Unspecified atrial fibrillation: Secondary | ICD-10-CM | POA: Diagnosis not present

## 2017-08-02 DIAGNOSIS — N210 Calculus in bladder: Secondary | ICD-10-CM | POA: Insufficient documentation

## 2017-08-02 DIAGNOSIS — I110 Hypertensive heart disease with heart failure: Secondary | ICD-10-CM | POA: Insufficient documentation

## 2017-08-02 DIAGNOSIS — Z955 Presence of coronary angioplasty implant and graft: Secondary | ICD-10-CM | POA: Insufficient documentation

## 2017-08-02 DIAGNOSIS — N201 Calculus of ureter: Secondary | ICD-10-CM | POA: Diagnosis not present

## 2017-08-02 DIAGNOSIS — I509 Heart failure, unspecified: Secondary | ICD-10-CM | POA: Insufficient documentation

## 2017-08-02 DIAGNOSIS — Z79899 Other long term (current) drug therapy: Secondary | ICD-10-CM | POA: Insufficient documentation

## 2017-08-02 HISTORY — PX: CYSTOSCOPY WITH LITHOLAPAXY: SHX1425

## 2017-08-02 HISTORY — PX: CYSTOSCOPY WITH RETROGRADE PYELOGRAM, URETEROSCOPY AND STENT PLACEMENT: SHX5789

## 2017-08-02 HISTORY — PX: HOLMIUM LASER APPLICATION: SHX5852

## 2017-08-02 SURGERY — CYSTOURETEROSCOPY, WITH RETROGRADE PYELOGRAM AND STENT INSERTION
Anesthesia: General

## 2017-08-02 MED ORDER — FENTANYL CITRATE (PF) 100 MCG/2ML IJ SOLN
INTRAMUSCULAR | Status: AC
  Filled 2017-08-02: qty 2

## 2017-08-02 MED ORDER — LACTATED RINGERS IV SOLN
INTRAVENOUS | Status: DC
  Administered 2017-08-02: 07:00:00 via INTRAVENOUS

## 2017-08-02 MED ORDER — HYDROCODONE-ACETAMINOPHEN 5-325 MG PO TABS: 1.0000 | ORAL_TABLET | ORAL | 0 refills | Status: DC | PRN

## 2017-08-02 MED ORDER — MIDAZOLAM HCL 2 MG/2ML IJ SOLN
INTRAMUSCULAR | Status: AC
  Filled 2017-08-02: qty 2

## 2017-08-02 MED ORDER — PROPOFOL 10 MG/ML IV BOLUS
INTRAVENOUS | Status: DC | PRN
  Administered 2017-08-02: 100 mg via INTRAVENOUS
  Administered 2017-08-02: 200 mg via INTRAVENOUS

## 2017-08-02 MED ORDER — PROPOFOL 10 MG/ML IV BOLUS
INTRAVENOUS | Status: AC
  Filled 2017-08-02: qty 20

## 2017-08-02 MED ORDER — MEPERIDINE HCL 50 MG/ML IJ SOLN: 6.2500 mg | INTRAMUSCULAR | Status: DC | PRN

## 2017-08-02 MED ORDER — 0.9 % SODIUM CHLORIDE (POUR BTL) OPTIME
TOPICAL | Status: DC | PRN
  Administered 2017-08-02: 1000 mL

## 2017-08-02 MED ORDER — PHENYLEPHRINE 40 MCG/ML (10ML) SYRINGE FOR IV PUSH (FOR BLOOD PRESSURE SUPPORT)
PREFILLED_SYRINGE | INTRAVENOUS | Status: DC | PRN
  Administered 2017-08-02: 80 ug via INTRAVENOUS

## 2017-08-02 MED ORDER — HYDROCODONE-ACETAMINOPHEN 7.5-325 MG PO TABS: 1.0000 | ORAL_TABLET | Freq: Once | ORAL | Status: DC | PRN

## 2017-08-02 MED ORDER — DEXAMETHASONE SODIUM PHOSPHATE 10 MG/ML IJ SOLN
INTRAMUSCULAR | Status: DC | PRN
  Administered 2017-08-02: 10 mg via INTRAVENOUS

## 2017-08-02 MED ORDER — PHENYLEPHRINE 40 MCG/ML (10ML) SYRINGE FOR IV PUSH (FOR BLOOD PRESSURE SUPPORT)
PREFILLED_SYRINGE | INTRAVENOUS | Status: AC
  Filled 2017-08-02: qty 10

## 2017-08-02 MED ORDER — SODIUM CHLORIDE 0.9 % IR SOLN
Status: DC | PRN
  Administered 2017-08-02: 9000 mL via INTRAVESICAL

## 2017-08-02 MED ORDER — MIDAZOLAM HCL 5 MG/5ML IJ SOLN
INTRAMUSCULAR | Status: DC | PRN
  Administered 2017-08-02 (×2): 1 mg via INTRAVENOUS

## 2017-08-02 MED ORDER — LIDOCAINE 2% (20 MG/ML) 5 ML SYRINGE
INTRAMUSCULAR | Status: DC | PRN
  Administered 2017-08-02: 100 mg via INTRAVENOUS

## 2017-08-02 MED ORDER — ACETAMINOPHEN 500 MG PO TABS
1000.0000 mg | ORAL_TABLET | Freq: Once | ORAL | Status: AC
  Administered 2017-08-02: 1000 mg via ORAL
  Filled 2017-08-02: qty 2

## 2017-08-02 MED ORDER — FENTANYL CITRATE (PF) 100 MCG/2ML IJ SOLN
INTRAMUSCULAR | Status: DC | PRN
  Administered 2017-08-02 (×2): 25 ug via INTRAVENOUS
  Administered 2017-08-02 (×3): 50 ug via INTRAVENOUS

## 2017-08-02 MED ORDER — ACETAMINOPHEN 10 MG/ML IV SOLN: 1000.0000 mg | Freq: Once | INTRAVENOUS | Status: DC | PRN

## 2017-08-02 MED ORDER — ONDANSETRON HCL 4 MG/2ML IJ SOLN
INTRAMUSCULAR | Status: DC | PRN
  Administered 2017-08-02: 4 mg via INTRAVENOUS

## 2017-08-02 MED ORDER — DEXAMETHASONE SODIUM PHOSPHATE 10 MG/ML IJ SOLN
INTRAMUSCULAR | Status: AC
  Filled 2017-08-02: qty 1

## 2017-08-02 MED ORDER — HYDROMORPHONE HCL 1 MG/ML IJ SOLN: 0.2500 mg | INTRAMUSCULAR | Status: DC | PRN

## 2017-08-02 MED ORDER — ONDANSETRON HCL 4 MG/2ML IJ SOLN
INTRAMUSCULAR | Status: AC
  Filled 2017-08-02: qty 2

## 2017-08-02 MED ORDER — LIDOCAINE 2% (20 MG/ML) 5 ML SYRINGE
INTRAMUSCULAR | Status: AC
  Filled 2017-08-02: qty 5

## 2017-08-02 MED ORDER — CIPROFLOXACIN IN D5W 400 MG/200ML IV SOLN
400.0000 mg | INTRAVENOUS | Status: AC
  Administered 2017-08-02: 400 mg via INTRAVENOUS
  Filled 2017-08-02: qty 200

## 2017-08-02 SURGICAL SUPPLY — 29 items
BAG URO CATCHER STRL LF (MISCELLANEOUS) ×3 IMPLANT
BASKET LASER NITINOL 1.9FR (BASKET) IMPLANT
BASKET STONE NITINOL 3FRX115MB (UROLOGICAL SUPPLIES) ×3 IMPLANT
BASKET ZERO TIP NITINOL 2.4FR (BASKET) IMPLANT
CATH INTERMIT  6FR 70CM (CATHETERS) ×3 IMPLANT
CATH URET 5FR 28IN CONE TIP (BALLOONS)
CATH URET 5FR 70CM CONE TIP (BALLOONS) IMPLANT
CLOTH BEACON ORANGE TIMEOUT ST (SAFETY) ×3 IMPLANT
COVER FOOTSWITCH UNIV (MISCELLANEOUS) ×3 IMPLANT
COVER SURGICAL LIGHT HANDLE (MISCELLANEOUS) IMPLANT
EXTRACTOR STONE 1.7FRX115CM (UROLOGICAL SUPPLIES) IMPLANT
FIBER LASER FLEXIVA 1000 (UROLOGICAL SUPPLIES) IMPLANT
FIBER LASER FLEXIVA 365 (UROLOGICAL SUPPLIES) IMPLANT
FIBER LASER FLEXIVA 550 (UROLOGICAL SUPPLIES) IMPLANT
FIBER LASER TRAC TIP (UROLOGICAL SUPPLIES) ×3 IMPLANT
GLOVE BIO SURGEON STRL SZ7.5 (GLOVE) ×3 IMPLANT
GOWN STRL REUS W/TWL LRG LVL3 (GOWN DISPOSABLE) IMPLANT
GOWN STRL REUS W/TWL XL LVL3 (GOWN DISPOSABLE) ×3 IMPLANT
GUIDEWIRE ANG ZIPWIRE 038X150 (WIRE) IMPLANT
GUIDEWIRE STR DUAL SENSOR (WIRE) ×3 IMPLANT
INFUSOR MANOMETER BAG 3000ML (MISCELLANEOUS) IMPLANT
MANIFOLD NEPTUNE II (INSTRUMENTS) ×3 IMPLANT
PACK CYSTO (CUSTOM PROCEDURE TRAY) ×3 IMPLANT
SHEATH URETERAL 12FRX28CM (UROLOGICAL SUPPLIES) IMPLANT
SHEATH URETERAL 12FRX35CM (MISCELLANEOUS) IMPLANT
STENT URET 6FRX26 CONTOUR (STENTS) ×3 IMPLANT
SYRINGE IRR TOOMEY STRL 70CC (SYRINGE) IMPLANT
TUBING CONNECTING 10 (TUBING) ×3 IMPLANT
TUBING UROLOGY SET (TUBING) ×3 IMPLANT

## 2017-08-02 NOTE — Op Note (Signed)
Operative Note  Preoperative diagnosis:  1.  Left ureteral calculus 2.  Bladder calculi  Postoperative diagnosis: 1.  Left ureteral calculus 2.  Bladder calculi  Procedure(s): 1.  Cystoscopy with left ureteroscopy, laser lithotripsy, ureteral stent exchange 2.  Cystolitholapaxy  Surgeon: Link Snuffer, MD  Assistants: None  Anesthesia: General  Complications: None immediate  EBL: Normal  Specimens: 1.  None  Drains/Catheters: 1.  6 x 26 double-J ureteral stent on the left  Intraoperative findings: 1.  Normal urethra except a high riding bladder neck. 2.  Bladder mucosa with a bladder diverticulum on the right.  Multiple stones were located within this and were extracted through the scope.  A basket was used to assist with this.  No other significant stone fragments were obvious after stone removal. 3.  Two 7 mm distal left ureteral calculi fragmented to less than 1 mm fragments.  Indication: 67 year old male status post urgent left ureteral stent placement for concern for sepsis presents for definitive management of his stone.  Description of procedure:  The patient was identified and consent was obtained.  The patient was taken to the operating room and placed in the supine position.  The patient was placed under general anesthesia.  Perioperative antibiotics were administered.  The patient was placed in dorsal lithotomy.  Patient was prepped and draped in a standard sterile fashion and a timeout was performed.  A 21 French rigid cystoscope was advanced into the urethra and into the bladder.  Complete cystoscopy was performed with findings noted above.  The left ureteral stent was removed.  A sensor wire was then advanced up the left ureter up to the kidney under fluoroscopic guidance.  A semirigid ureteroscope was advanced alongside the wire up to the distal stones of interest which were fragmented to less than 1 mm fragments.  No other ureteral calculi were obvious.  I then  withdrew the ureteroscope.  I advanced a 67 French rigid cystoscope into the bladder and identified many stone fragments within the bladder.  These were evacuated through the scope.  Several fragments had to be basket extracted through the scope.  After all stone fragments appeared to be removed, I drained the bladder and withdrew the scope.  I then placed a 6 x 26 double-J ureteral stent over the wire under fluoroscopic guidance and remove the wire.  Fluoroscopy confirmed proximal and distal placement.  String was left in place.  This concluded the operation.  The patient tolerated the procedure well and was stable postoperatively.  Plan: Patient will remove his stent on Monday morning.  He can follow-up in 4 to 6 weeks for renal ultrasound and KUB.

## 2017-08-02 NOTE — Transfer of Care (Signed)
Immediate Anesthesia Transfer of Care Note  Patient: [name]  Procedure(s) Performed: CYSTOSCOPY WITH LEFT RETROGRADE PYELOGRAM, URETEROSCOPY AND STENT EXCHANGE (Left ) HOLMIUM LASER APPLICATION (Left ) CYSTOSCOPY WITH BLADDER STONE EXTRACTION (N/A )  Patient Location: PACU  Anesthesia Type:General  Level of Consciousness: awake, alert  and oriented  Airway & Oxygen Therapy: Patient Spontanous Breathing and Patient connected to face mask oxygen  Post-op Assessment: Report given to RN and Post -op Vital signs reviewed and stable  Post vital signs: Reviewed and stable  Last Vitals:  Vitals Value Taken Time  BP    Temp    Pulse 98 08/02/2017 10:09 AM  Resp    SpO2 100 % 08/02/2017 10:09 AM  Vitals shown include unvalidated device data.  Last Pain:  Vitals:   08/02/17 0713  TempSrc:   PainSc: 0-No pain         Complications: No apparent anesthesia complications

## 2017-08-02 NOTE — Anesthesia Postprocedure Evaluation (Signed)
Anesthesia Post Note  Patient: Larry [name]  Procedure(s) Performed: CYSTOSCOPY WITH LEFT RETROGRADE PYELOGRAM, URETEROSCOPY AND STENT EXCHANGE (Left ) HOLMIUM LASER APPLICATION (Left ) CYSTOSCOPY WITH BLADDER STONE EXTRACTION (N/A )     Patient location during evaluation: PACU Anesthesia Type: General Level of consciousness: awake and alert Pain management: pain level controlled Vital Signs Assessment: post-procedure vital signs reviewed and stable Respiratory status: spontaneous breathing, nonlabored ventilation, respiratory function stable and patient connected to nasal cannula oxygen Cardiovascular status: blood pressure returned to baseline and stable Postop Assessment: no apparent nausea or vomiting Anesthetic complications: no    Last Vitals:  Vitals:   08/02/17 1045 08/02/17 1106  BP: (!) 153/103 (!) 145/95  Pulse: 91 89  Resp: 20 18  Temp:  36.6 C  SpO2: 97% 97%    Last Pain:  Vitals:   08/02/17 1106  TempSrc:   PainSc: 0-No pain                 Barnet Glasgow

## 2017-08-02 NOTE — Anesthesia Procedure Notes (Signed)
Procedure Name: LMA Insertion Date/Time: 08/02/2017 8:44 AM Performed by: Maxwell Caul, CRNA Pre-anesthesia Checklist: Patient identified, Emergency Drugs available, Suction available and Patient being monitored Patient Re-evaluated:Patient Re-evaluated prior to induction Oxygen Delivery Method: Circle system utilized Preoxygenation: Pre-oxygenation with 100% oxygen Induction Type: IV induction Ventilation: Mask ventilation without difficulty LMA: LMA inserted LMA Size: 5.0 Number of attempts: 1 Placement Confirmation: positive ETCO2 and breath sounds checked- equal and bilateral Tube secured with: Tape Dental Injury: Teeth and Oropharynx as per pre-operative assessment

## 2017-08-02 NOTE — Interval H&P Note (Signed)
History and Physical Interval Note:  08/02/2017 8:15 AM  Larry [name]  has presented today for surgery, with the diagnosis of LEFT URETERAL STONE AND BLADDER STONES  The various methods of treatment have been discussed with the patient and family. After consideration of risks, benefits and other options for treatment, the patient has consented to  Procedure(s): CYSTOSCOPY WITH LEFT RETROGRADE PYELOGRAM, URETEROSCOPY AND STENT PLACEMENT (Left) HOLMIUM LASER APPLICATION (Left) CYSTOSCOPY WITH LITHOLAPAXY (N/A) as a surgical intervention .  The patient's history has been reviewed, patient examined, no change in status, stable for surgery.  I have reviewed the patient's chart and labs.  Questions were answered to the patient's satisfaction.     Marton Redwood, III

## 2017-08-02 NOTE — Discharge Instructions (Addendum)
[facility] [phone] Post Ureteroscopy With or Without Stent Instructions  Definitions:  Remove your stent on Monday morning by gently pulling the string.  Ureter: The duct that transports urine from the kidney to the bladder. Stent:   A plastic hollow tube that is placed into the ureter, from the kidney to the                 bladder to prevent the ureter from swelling shut.  GENERAL INSTRUCTIONS:  Despite the fact that no skin incisions were used, the area around the ureter and bladder is raw and irritated. The stent is a foreign body which will further irritate the bladder wall. This irritation is manifested by increased frequency of urination, both day and night, and by an increase in the urge to urinate. In some, the urge to urinate is present almost always. Sometimes the urge is strong enough that you may not be able to stop yourself from urinating. The only real cure is to remove the stent and then give time for the bladder wall to heal which can't be done until the danger of the ureter swelling shut has passed, which varies.  You may see some blood in your urine while the stent is in place and a few days afterwards. Do not be alarmed, even if the urine was clear for a while. Get off your feet and drink lots of fluids until clearing occurs. If you start to pass clots or don't improve, call us.  DIET: You may return to your normal diet immediately. Because of the raw surface of your bladder, alcohol, spicy foods, acid type foods and drinks with caffeine may cause irritation or frequency and should be used in moderation. To keep your urine flowing freely and to avoid constipation, drink plenty of fluids during the day ( 8-10 glasses ). Tip: Avoid cranberry juice because it is very acidic.  ACTIVITY: Your physical activity doesn't need to be restricted. However, if you are very active, you may see some blood in your urine. We suggest that you reduce your activity under  these circumstances until the bleeding has stopped.  BOWELS: It is important to keep your bowels regular during the postoperative period. Straining with bowel movements can cause bleeding. A bowel movement every other day is reasonable. Use a mild laxative if needed, such as Milk of Magnesia 2-3 tablespoons, or 2 Dulcolax tablets. Call if you continue to have problems. If you have been taking narcotics for pain, before, during or after your surgery, you may be constipated. Take a laxative if necessary.   MEDICATION: You should resume your pre-surgery medications unless told not to. You may take oxybutynin or flomax if prescribed for bladder spasms or discomfort from the stent Take pain medication as directed for pain refractory to conservative management  PROBLEMS YOU SHOULD REPORT TO [place]:  Fevers over 100.5 Fahrenheit.  Heavy bleeding, or clots ( See above notes about blood in urine ).  Inability to urinate.  Drug reactions ( hives, rash, nausea, vomiting, diarrhea ).  Severe burning or pain with urination that is not improving.

## 2017-08-02 NOTE — H&P (Signed)
CC: I have kidney stones.(Surgery)  HPI: Larry [name] is a 67 year-old male patient who is here for renal calculi after a surgical intervention.  The problem is on the left side. He had stent for treatment of his renal calculi. Patient denies ureteroscopy, eswl, and percutaneous lithotomy. This procedure was done 06/30/2017. He did not pass stone fragments. This is his first kidney stone. He does have a stent in place.   He is currently having back pain. He denies having flank pain, groin pain, nausea, vomiting, fever, and chills.   He does not have dysuria. He does not have urgency. He does not have frequency.   The patient underwent urgent ureteral stent placement for a septic stone on the left. He has been doing well since hospital discharge. He was discharged with a Foley catheter due to retention. Of note he does have a large right-sided bladder diverticulum which may give false readings on bladder scan. He also has numerous bladder calculi. I believe the majority of these were evacuated during the ureteral stent placement.     ALLERGIES: None   MEDICATIONS: Cipro 500 mg tablet  Metoprolol Tartrate 25 mg tablet  Tamsulosin Hcl 0.4 mg capsule  Aspir 81  Atorvastatin Calcium 40 mg tablet  Centrum  Clopidogrel 75 mg tablet  Diltiazem Hcl 30 mg tablet  Ferrous Sulfate  Furosemide 40 mg tablet  Gas X  Spironolactone 25 mg tablet     GU PSH: Cysto Remove Stent FB Sim - 06/30/2017 Cystoscopy Insert Stent, Left - 06/30/2017    NON-GU PSH: Colostomy Heart Surgery (Unspecified), stents    GU PMH: Kidney Failure Unspec Renal calculus    NON-GU PMH: Arrhythmia Arthritis Asthma Colon Cancer, History Coronary Artery Disease Heart failure, unspecified Hypertension Sleep Apnea    FAMILY HISTORY: 1 son - Other Diabetes - Father Hypertension - Father   SOCIAL HISTORY: Marital Status: Unknown Preferred Language: English; Race: Black or African American Current Smoking  Status: Patient does not smoke anymore.   Tobacco Use Assessment Completed: Used Tobacco in last 30 days? Drinks 1 caffeinated drink per day.    REVIEW OF SYSTEMS:    GU Review Male:   Patient reports frequent urination. Patient denies hard to postpone urination, burning/ pain with urination, get up at night to urinate, leakage of urine, stream starts and stops, trouble starting your stream, have to strain to urinate , erection problems, and penile pain.  Gastrointestinal (Upper):   Patient denies nausea, vomiting, and indigestion/ heartburn.  Gastrointestinal (Lower):   Patient denies diarrhea and constipation.  Constitutional:   Patient reports fatigue. Patient denies fever, night sweats, and weight loss.  Skin:   Patient denies skin rash/ lesion and itching.  Eyes:   Patient denies blurred vision and double vision.  Ears/ Nose/ Throat:   Patient denies sore throat and sinus problems.  Hematologic/Lymphatic:   Patient denies swollen glands and easy bruising.  Cardiovascular:   Patient reports leg swelling. Patient denies chest pains.  Respiratory:   Patient reports shortness of breath. Patient denies cough.  Endocrine:   Patient denies excessive thirst.  Musculoskeletal:   Patient denies back pain and joint pain.  Neurological:   Patient denies headaches and dizziness.  Psychologic:   Patient denies depression and anxiety.   VITAL SIGNS:      07/19/2017 03:55 PM  Weight 330 lb / 149.69 kg  Height 71 in / 180.34 cm  BP 121/70 mmHg  Heart Rate 114 /min  BMI 46.0 kg/m  GU PHYSICAL EXAMINATION:    Penis: Foley catheter in place draining clear but light pink urine   MULTI-SYSTEM PHYSICAL EXAMINATION:    Constitutional: Well-nourished. No physical deformities. Normally developed. Good grooming.  Respiratory: No labored breathing, no use of accessory muscles.   Cardiovascular: Normal temperature, adequate perfusion of extremities, he was slightly tachycardic today but in no acute  distress  Skin: No paleness, no jaundice  Neurologic / Psychiatric: Oriented to time, oriented to place, oriented to person. No depression, no anxiety, no agitation.  Gastrointestinal: No mass, no tenderness, no rigidity, morbidly obese abdomen.   Eyes: Normal conjunctivae. Normal eyelids.  Musculoskeletal: Uses a walker     PAST DATA REVIEWED:  Source Of History:  Patient  Records Review:   Previous Patient Records   PROCEDURES:           Voiding Trial - 51700  Voided Volume: 150 cc  Instilled Volume: 380 cc   ASSESSMENT:      ICD-10 Details  1 GU:   Ureteral calculus - N20.1   2   Bladder Stone - N21.0   3   Bladder Diverticulum - N32.3    PLAN:           Schedule Return Visit/Planned Activity: 1 Day - Nurse Visit-No Urine             Note: First thing in the morning for a voiding trial  Return Visit/Planned Activity: ASAP - Schedule Surgery          Document Letter(s):  Created for Patient: Clinical Summary         Notes:   Proceed with cystolithotripsy, left ureteroscopy with laser lithotripsy and ureteral stent exchange. He understands potential risks including but not limited to bleeding, infection, injury to surrounding structures including potential rare with serious ureteral injury which can require significant abdominal surgery to fix. He is eager to proceed.   Plan for voiding trial in the morning.    Signed by Link Snuffer, III, M.D. on 07/20/17 at 10:03 AM (EDT

## 2017-08-03 ENCOUNTER — Encounter (HOSPITAL_COMMUNITY): Payer: Self-pay | Admitting: Urology

## 2017-08-04 DIAGNOSIS — N390 Urinary tract infection, site not specified: Secondary | ICD-10-CM | POA: Diagnosis not present

## 2017-08-04 DIAGNOSIS — Z48815 Encounter for surgical aftercare following surgery on the digestive system: Secondary | ICD-10-CM | POA: Diagnosis not present

## 2017-08-04 DIAGNOSIS — R339 Retention of urine, unspecified: Secondary | ICD-10-CM | POA: Diagnosis not present

## 2017-08-04 DIAGNOSIS — Z433 Encounter for attention to colostomy: Secondary | ICD-10-CM | POA: Diagnosis not present

## 2017-08-04 DIAGNOSIS — B9689 Other specified bacterial agents as the cause of diseases classified elsewhere: Secondary | ICD-10-CM | POA: Diagnosis not present

## 2017-08-04 DIAGNOSIS — Z466 Encounter for fitting and adjustment of urinary device: Secondary | ICD-10-CM | POA: Diagnosis not present

## 2017-08-07 DIAGNOSIS — Z466 Encounter for fitting and adjustment of urinary device: Secondary | ICD-10-CM | POA: Diagnosis not present

## 2017-08-07 DIAGNOSIS — Z48815 Encounter for surgical aftercare following surgery on the digestive system: Secondary | ICD-10-CM | POA: Diagnosis not present

## 2017-08-07 DIAGNOSIS — N390 Urinary tract infection, site not specified: Secondary | ICD-10-CM | POA: Diagnosis not present

## 2017-08-07 DIAGNOSIS — R339 Retention of urine, unspecified: Secondary | ICD-10-CM | POA: Diagnosis not present

## 2017-08-07 DIAGNOSIS — B9689 Other specified bacterial agents as the cause of diseases classified elsewhere: Secondary | ICD-10-CM | POA: Diagnosis not present

## 2017-08-07 DIAGNOSIS — Z433 Encounter for attention to colostomy: Secondary | ICD-10-CM | POA: Diagnosis not present

## 2017-08-08 DIAGNOSIS — Z433 Encounter for attention to colostomy: Secondary | ICD-10-CM | POA: Diagnosis not present

## 2017-08-08 DIAGNOSIS — N390 Urinary tract infection, site not specified: Secondary | ICD-10-CM | POA: Diagnosis not present

## 2017-08-08 DIAGNOSIS — Z48815 Encounter for surgical aftercare following surgery on the digestive system: Secondary | ICD-10-CM | POA: Diagnosis not present

## 2017-08-08 DIAGNOSIS — Z466 Encounter for fitting and adjustment of urinary device: Secondary | ICD-10-CM | POA: Diagnosis not present

## 2017-08-08 DIAGNOSIS — R339 Retention of urine, unspecified: Secondary | ICD-10-CM | POA: Diagnosis not present

## 2017-08-08 DIAGNOSIS — B9689 Other specified bacterial agents as the cause of diseases classified elsewhere: Secondary | ICD-10-CM | POA: Diagnosis not present

## 2017-08-11 DIAGNOSIS — Z48815 Encounter for surgical aftercare following surgery on the digestive system: Secondary | ICD-10-CM | POA: Diagnosis not present

## 2017-08-11 DIAGNOSIS — N390 Urinary tract infection, site not specified: Secondary | ICD-10-CM | POA: Diagnosis not present

## 2017-08-11 DIAGNOSIS — Z433 Encounter for attention to colostomy: Secondary | ICD-10-CM | POA: Diagnosis not present

## 2017-08-11 DIAGNOSIS — R339 Retention of urine, unspecified: Secondary | ICD-10-CM | POA: Diagnosis not present

## 2017-08-11 DIAGNOSIS — B9689 Other specified bacterial agents as the cause of diseases classified elsewhere: Secondary | ICD-10-CM | POA: Diagnosis not present

## 2017-08-11 DIAGNOSIS — Z466 Encounter for fitting and adjustment of urinary device: Secondary | ICD-10-CM | POA: Diagnosis not present

## 2017-08-14 ENCOUNTER — Encounter (INDEPENDENT_AMBULATORY_CARE_PROVIDER_SITE_OTHER): Payer: Self-pay

## 2017-08-14 ENCOUNTER — Encounter: Payer: Self-pay | Admitting: Nurse Practitioner

## 2017-08-14 ENCOUNTER — Ambulatory Visit (INDEPENDENT_AMBULATORY_CARE_PROVIDER_SITE_OTHER): Payer: Medicare Other | Admitting: Nurse Practitioner

## 2017-08-14 DIAGNOSIS — I259 Chronic ischemic heart disease, unspecified: Secondary | ICD-10-CM | POA: Diagnosis not present

## 2017-08-14 DIAGNOSIS — I4719 Other supraventricular tachycardia: Secondary | ICD-10-CM

## 2017-08-14 DIAGNOSIS — I471 Supraventricular tachycardia: Secondary | ICD-10-CM | POA: Diagnosis not present

## 2017-08-14 DIAGNOSIS — I5022 Chronic systolic (congestive) heart failure: Secondary | ICD-10-CM | POA: Diagnosis not present

## 2017-08-14 DIAGNOSIS — E7800 Pure hypercholesterolemia, unspecified: Secondary | ICD-10-CM | POA: Diagnosis not present

## 2017-08-14 DIAGNOSIS — R0602 Shortness of breath: Secondary | ICD-10-CM | POA: Diagnosis not present

## 2017-08-14 LAB — BASIC METABOLIC PANEL
BUN/Creatinine Ratio: 10 (ref 10–24)
BUN: 19 mg/dL (ref 8–27)
CO2: 21 mmol/L (ref 20–29)
Calcium: 9.9 mg/dL (ref 8.6–10.2)
Chloride: 105 mmol/L (ref 96–106)
Creatinine, Ser: 1.84 mg/dL — ABNORMAL HIGH (ref 0.76–1.27)
GFR calc Af Amer: 44 mL/min/{1.73_m2} — ABNORMAL LOW (ref >59)
GFR calc non Af Amer: 38 mL/min/{1.73_m2} — ABNORMAL LOW (ref >59)
Glucose: 89 mg/dL (ref 65–99)
Potassium: 3.4 mmol/L — ABNORMAL LOW (ref 3.5–5.2)
Sodium: 141 mmol/L (ref 134–144)

## 2017-08-14 MED ORDER — SPIRONOLACTONE 25 MG PO TABS: 12.5000 mg | ORAL_TABLET | Freq: Every day | ORAL | 6 refills | Status: DC

## 2017-08-14 NOTE — Patient Instructions (Addendum)
We will be checking the following labs today - BMET  BMET in one week   Medication Instructions:    Continue with your current medicines. BUT  Restart the Spironolactone at half dose as before     Testing/Procedures To Be Arranged:  N/A  Follow-Up:   See me in 3 months    Other Special Instructions:   N/A    If you need a refill on your cardiac medications before your next appointment, please call your pharmacy.   Call the [place] office at [phone] if you have any questions, problems or concerns.

## 2017-08-14 NOTE — Progress Notes (Addendum)
CARDIOLOGY OFFICE NOTE  Date:  08/14/2017    Larry [name] Date of Birth: 1950-03-26 Medical Record #[id number]  PCP:  Lin Landsman, MD  Cardiologist:  [place]   Chief Complaint  Patient presents with  . Irregular Heart Beat    Follow up visit - seen for Dr. Rayann Heman    History of Present Illness: Larry [name] is a 67 y.o. male who presents today for a follow up visit. Seen for Dr. Rayann Heman.  He has an ischemic CM with severe diffuse 3VD and prior stents to the RCA in 7116, systolic and diastolic dysfunction, HTN, HLD and obesity. Has had a history of long standing atrial tach that has been treated with beta blockerand CCB therapy. Was hospitalized back in April of 2013 with a HF exacerbation - had stopped his medicines. Last EF of 40 - to 57% with diastolic dysfunction as well noted in January of 2015.   Seen by me back in October of 2014 - Was out of his medicines again. Had lost weight and was anemic - was sending to GI and ended up having colon cancer.   He was admitted (02/02/2013 - 03/01/2013) to [facility] with subacute history of progressive weakness, constipation and difficulty eating and drinking. He presented with acute renal failure (Creatinine 7.83) and severe anemia (Hbg of 7.3) as well as obstruction. Colonoscopy doing this admission with biopsy revealed adenocarcinoma and patient underwent exploratory lap with partial colectomy with colostomy on 12/23 by Dr. Hulen Skains. Recovery was complicated by wound dehissance, requiring a return to the OR 12/29 with abdominal wound closure with VAC placment. After the procedure, the patient developed atrial tachycardia, so a dilt drip was started (12/29-1/2). On 1/4, the patient had a cardiac arrest, requiring defibrillation, and was subsequently transferred to the ICU. QT was found to be >700, with mild hypokalemia and hypomagnesemia implicated as the cause. Lidocaine drip was started. Electrolyte  abnormality treated and 2D-echo revealed an EF of 40-45% with periapical akinesis and biatrial enlargement. He experienced recurrent torsades on 01/05 and shocked in NSR. By 1/6, QTc had decreased to 499, lidocaine drip was d/c'ed, and patient was transferred out of ICU to stepdown. Then admitted to physical medicine and rehabilitation on 01/09 due to severe deconditioning. He was continued on protein supplement to promote wound healing. His abdominal wound was treated with VAC through 03/05/13. His heart rate was controlled without recurrent arrythmia and was maintained on potassium and magnesium supplements. He was discharged to home on 03/07/2013. He was last evaluated by Dr. Hulen Skains on 02/03 with review of his pathology demonstrating invasive adenocarcinoma of the transverse colon with penetration into the pericolonic fat. He had 0/18 lymph nodes negative for metastatic disease. He was pathologic stage [id number].   Saw Dr. Gwenlyn Found in early July of 2015 for a foot ulcer - basically normal ABI'snoted at that time.  He was readmitted to the hospital in August of 2015 - triggered by too much salt. Presented with respiratory distress - treated with Bipap and quickly stabilized. Was diuresed about 6 liters as well. Started on low dose ACE. Sleep study recommended.  I have seen back here several times since that hospitalization and have basically followed him since -he got referred for a sleep study and is on CPAP -in March of 2016 - wanting to get his colostomy reversed and needing pre op clearance. Had a Myoview to risk stratify - this was high risk and I referred him on  for cardiac cath with Dr. Burt Knack. Underwent PCI to the LAD with DES x 2. Committed to DAPT for one year ideally. He had to wait on colostomy reversal due to being on DAPT.  Seen at the cancer center back in [facility] 2017-HR elevated - asked to follow up here - I increased his Coreg further. He ended up going in to the hospital for  aweekend - still tachycardic - switched over to daily dosing of Verapamil and changed Coreg to Lopressor - looks like his ACE was stopped - pulmonary wanted him on ARB which was to be considered as an outpatient. Seen by pulmonary for a cough which led to a CT scan. HR in the 130's the whole time while in the ER. I then saw him back like 3 days later - he was feeling fine. Not short of breath. No more cough.   InNovember of 2017 - was having his tests done for planned reversal of his colostomy. Colostomy reversal occurred in February - this did not go well - had an anastomotic leak on POD 7 - ended up with an ileostomy and required a prolonged stay at [facility] respiratory failure. He was seen by cardiology for intermittent atrial tachycardia.  I saw him back in July of 2018 after he had been home from [facility] for about a month - he was out of several of his medicines. HR was up. No awareness of his heart beating fast.  I saw him earlier this year - then referred back to Dr. Rayann Heman for his atrial tach just to make sure we were all on the right track with his current plan of care. Has chronic massive lower extremity edema. Options felt to be limited. CCB therapy continued.   Then admitted last month - had sepsis from a UTI. His medicines were cut back again. We have had several phone calls from Home health about his elevated HR after discharge - now back on his usual dose of CCB therapy and beta blocker.   He underwent GU procedures earlier this month - we felt he was at high risk for any procedures going forward. He did ok.   Comes in today. Here alone. He feels like he is doing ok. He notes that the [place] remain quite worried about his heart rate. He has no sensation of palpitations or fast heart beating. No chest pain. His breathing is at his baseline. He is trying to walk a little more but admits that "it is still not much". Swelling is chronic. He remains off his Aldactone. He is back on  his other medicines.   Past Medical History:  Diagnosis Date  . Arthritis   . Atrial tachycardia ([place])    managed on beta blocker therapy  . Childhood asthma    "went away after I was 16"  . Chronic combined systolic and diastolic CHF, NYHA class 3 (HCC)    has diastolic heart failure grade 1; EF is 45 to 50% per echo 05/2011; EF 41% by Myoview 2016  . CKD (chronic kidney disease) stage 3, GFR 30-59 ml/min (HCC)   . Colon cancer ([place]) 2015   MSI high; IHC loss of MLH1 and PMS2; BRAF negative; Negative methylation  . COPD (chronic obstructive pulmonary disease) ([place])   . Coronary artery disease   . Family history of ovarian cancer   . Hypercholesteremia   . Hypertension   . Noncompliance   . NSVT (nonsustained ventricular tachycardia) (HCC)    beta blocker restarted  .  Obesity   . OSA on CPAP    used nightly, pt does not know settings  . Peripheral arterial disease (HCC)    4.2 cm thoracic aortic aneurysm per chest ct 11-14-15 epic  . S/P colostomy ([place])    2014  . Thrombocytopenia ([place])   . Torsades de pointes ([place])    X 2 episodes during hospital visit 12'14"electrolyte imbalance"- "Shocked"    Past Surgical History:  Procedure Laterality Date  . COLON SURGERY    . COLONOSCOPY N/A 02/08/2013   Procedure: COLONOSCOPY;  Surgeon: [name], MD;  Location: [place];  Service: Endoscopy;  Laterality: N/A;  . COLONOSCOPY WITH PROPOFOL N/A 05/09/2014   Procedure: COLONOSCOPY WITH PROPOFOL;  Surgeon: [name], MD;  Location: [facility];  Service: Endoscopy;  Laterality: N/A;  . COLONOSCOPY WITH PROPOFOL N/A 01/08/2016   Procedure: COLONOSCOPY WITH PROPOFOL;  Surgeon: [name], MD;  Location: [facility];  Service: Endoscopy;  Laterality: N/A;  . COLOSTOMY N/A 04/19/2016   Procedure: COLOSTOMY;  Surgeon: Judeth Horn, MD;  Location: [place];  Service: General;  Laterality: N/A;  . COLOSTOMY REVERSAL  04/12/2016  . COLOSTOMY REVERSAL N/A 04/12/2016   Procedure:  COLOSTOMY REVERSAL;  Surgeon: Judeth Horn, MD;  Location: [place];  Service: General;  Laterality: N/A;  . CORONARY ANGIOPLASTY WITH STENT PLACEMENT  11/12/2008; 06/11/2014   stent x 2 to RCA; stent x 2 to LAD  . CORONARY ANGIOPLASTY WITH STENT PLACEMENT  06/11/2014   m-LAD 3.5 x 16 mm Synergy DES, d-LAD  2.25 x 16 mm Synergy DES  . CYSTOSCOPY W/ URETERAL STENT PLACEMENT Left 06/30/2017   Procedure: CYSTOSCOPY WITH RETROGRADE PYELOGRAM/URETERAL STENT PLACEMENT;  Surgeon: Lucas Mallow, MD;  Location: [place];  Service: Urology;  Laterality: Left;  . CYSTOSCOPY WITH LITHOLAPAXY N/A 08/02/2017   Procedure: CYSTOSCOPY WITH BLADDER STONE EXTRACTION;  Surgeon: Lucas Mallow, MD;  Location: [facility];  Service: Urology;  Laterality: N/A;  . CYSTOSCOPY WITH RETROGRADE PYELOGRAM, URETEROSCOPY AND STENT PLACEMENT Left 08/02/2017   Procedure: CYSTOSCOPY WITH LEFT RETROGRADE PYELOGRAM, URETEROSCOPY AND STENT EXCHANGE;  Surgeon: Lucas Mallow, MD;  Location: [facility];  Service: Urology;  Laterality: Left;  . ESOPHAGOGASTRODUODENOSCOPY N/A 02/08/2013   Procedure: ESOPHAGOGASTRODUODENOSCOPY (EGD);  Surgeon: [name], MD;  [facility];  Service: Endoscopy;  Laterality: N/A;  . FLEXIBLE SIGMOIDOSCOPY N/A 02/19/2016   Procedure: FLEXIBLE SIGMOIDOSCOPY;  Surgeon: [name], MD;  [facility];  Service: Endoscopy;  Laterality: N/A;  . HOLMIUM LASER APPLICATION Left 08/13/6331   Procedure: HOLMIUM LASER APPLICATION;  Surgeon: Lucas Mallow, MD;  [facility];  Service: Urology;  Laterality: Left;  . LAPAROTOMY N/A 02/12/2013   Procedure: EXPLORATORY LAPAROTOMY PARTIAL COLECTOMY WITH COLOSTOMY;  Surgeon: [name], MD;  Location: [place];  Service: General;  Laterality: N/A;  . LAPAROTOMY N/A 02/18/2013   Procedure: EXPLORATORY LAPAROTOMY/Closure of Wound;  Surgeon: [name], MD;  Location: [place];  Service: General;  Laterality: N/A;  . LAPAROTOMY N/A 04/19/2016    Procedure: EXPLORATORY LAPAROTOMY, REPAIR OF ANASTAMOTIC LEAK;  Surgeon: Judeth Horn, MD;  Location: [place];  Service: General;  Laterality: N/A;  . LEFT HEART CATHETERIZATION WITH CORONARY ANGIOGRAM N/A 06/11/2014   Procedure: LEFT HEART CATHETERIZATION WITH CORONARY ANGIOGRAM;  Surgeon: [name], MD; CFX calcified, 30-40 percent, RCA calcified, 40/50/40%, PDA diffuse disease, LAD 40/75/90% s/p DES 2      Medications: Current Meds  Medication Sig  . aspirin  EC 81 MG tablet Take 1 tablet (81 mg total) by mouth daily.  [name] Kitchen atorvastatin (LIPITOR) 40 MG tablet Take 40 mg by mouth daily.  . clopidogrel (PLAVIX) 75 MG tablet Take 75 mg by mouth daily.  [name] Kitchen diltiazem (CARDIZEM) 90 MG tablet Take 1 tablet (90 mg total) by mouth 3 (three) times daily.  . ferrous sulfate 325 (65 FE) MG tablet Take 1 tablet (325 mg total) by mouth daily with breakfast.  . furosemide (LASIX) 40 MG tablet Take 1.5 tablets (60 mg total) by mouth daily with lunch.  . levalbuterol (XOPENEX HFA) 45 MCG/ACT inhaler INHALE 2 PUFFS INTO THE LUNGS EVERY 4 (FOUR) HOURS AS NEEDED FOR WHEEZING.  . metoprolol tartrate (LOPRESSOR) 25 MG tablet Take 1.5 tablets (37.5 mg total) by mouth 2 (two) times daily.  . Multiple Vitamins-Minerals (CENTRUM SILVER 50+MEN) TABS Take 1 tablet by mouth daily with lunch.  . simethicone (GAS-X EXTRA STRENGTH) 125 MG chewable tablet Chew 125 mg by mouth 3 (three) times daily.  [name] Kitchen spironolactone (ALDACTONE) 25 MG tablet Take 0.5 tablets (12.5 mg total) by mouth daily.  . tamsulosin (FLOMAX) 0.4 MG CAPS capsule Take 1 capsule (0.4 mg total) by mouth daily.  . [DISCONTINUED] atorvastatin (LIPITOR) 40 MG tablet Take 1 tablet (40 mg total) by mouth daily. (Patient taking differently: Take 40 mg by mouth at bedtime. )  . [DISCONTINUED] spironolactone (ALDACTONE) 25 MG tablet Take 0.5 tablets (12.5 mg total) by mouth daily. HOLD until follow-up with your cardiologist     Allergies: No Known  Allergies  Social History: The patient  reports that he quit smoking about 9 years ago. His smoking use included cigarettes. He has a 38.00 pack-year smoking history. He has never used smokeless tobacco. He reports that he drinks alcohol. He reports that he does not use drugs.   Family History: The patient's family history includes Dementia in his mother; Diabetes in his father; Heart disease in his father; Hypertension in his father and mother; Leukemia in his maternal uncle; Liver cancer in his maternal grandmother; Parkinsonism in his mother; Prostate cancer in his maternal uncle.   Review of Systems: Please see the history of present illness.   Otherwise, the review of systems is positive for none.   All other systems are reviewed and negative.   Physical Exam: VS:  BP 120/70   Pulse 99   Ht 5' 11"  (1.803 m)   Wt (!) 345 lb 6.4 oz (156.7 kg)   BMI 48.17 kg/m  .  BMI Body mass index is 48.17 kg/m.  Wt Readings from Last 3 Encounters:  08/14/17 (!) 345 lb 6.4 oz (156.7 kg)  08/02/17 (!) 350 lb (158.8 kg)  07/31/17 (!) 350 lb 8 oz (159 kg)    General: Pleasant. Morbidly obese. Alert and in no acute distress.   HEENT: Normal.  Neck: Supple, no JVD, carotid bruits, or masses noted.  Cardiac: Heart tones are distant due to body habitus. He has massive edema of the lower extremities which is chronic.  Respiratory:  Lungs are fairlyclear to auscultation bilaterally with normal work of breathing.  GI: Soft and nontender.  MS: No deformity or atrophy. Gait and ROM intact.  Skin: Warm and dry. Color is normal.  Neuro:  Strength and sensation are intact and no gross focal deficits noted.  Psych: Alert, appropriate and with normal affect.   LABORATORY DATA:  EKG:  EKG is ordered today. This demonstrates sinus with 1st degree AV block. His HR is 99  today.  Lab Results  Component Value Date   WBC 6.1 07/31/2017   HGB 11.1 (L) 07/31/2017   HCT 35.5 (L) 07/31/2017   PLT 135 (L)  07/31/2017   GLUCOSE 95 07/31/2017   CHOL 148 05/03/2017   TRIG 171 (H) 05/03/2017   HDL 54 05/03/2017   LDLCALC 60 05/03/2017   ALT 20 07/01/2017   AST 19 07/01/2017   NA 141 07/31/2017   K 3.6 07/31/2017   CL 109 07/31/2017   CREATININE 1.85 (H) 07/31/2017   BUN 21 (H) 07/31/2017   CO2 22 07/31/2017   TSH 1.080 05/14/2016   INR 1.31 06/30/2017   HGBA1C 5.9 (H) 05/14/2016     BNP (last 3 results) No results for input(s): BNP in the last 8760 hours.  ProBNP (last 3 results) Recent Labs    02/22/17 0926 05/03/17 1021  PROBNP 87 126     Other Studies Reviewed Today:  EchoStudy Conclusions2/2018  - Left ventricle: The cavity size was normal. Wall thickness was increased in a pattern of moderate LVH. Systolic function was normal. The estimated ejection fraction was in the range of 55% to 60%. Wall motion was normal; there were no regional wall motion abnormalities. The study is not technically sufficient to allow evaluation of LV diastolic function. - Aortic root: The aortic root was mildly dilated. - Mitral valve: Calcified annulus.  Impressions:  - Technically difficult; definity used; normal LV function; moderate LVH.   MyoviewStudy Highlights12/2017   Nuclear stress EF: 58%.  There was no ST segment deviation noted during stress.  There is a large defect of severe severity present in the basal inferior, mid inferior, apical septal, apical inferior and apex location. The defect is non-reversible. No ischemia noted. Tthis is consistent with diaphragmatic attenuation artifact vs. Scar.  This is a low risk study.  The left ventricular ejection fraction is normal (55-65%).      Procedure: Left Heart Cath, Selective Coronary Angiography, PTCA and stenting of the mid and distal LAD April 2016.  Coronary angiography: Left mainstem: The left mainstem is patent. The vessel divides into the LAD and left circumflex. There  is no significant stenosis, but there is mild irregularity.  Left anterior descending (LAD): The LAD is moderately calcified. The vessel is severely diseased. The proximal LAD is patent with 30-40% stenosis after the second septal perforator. There is diffuse calcification. The diagonal branches are small. The mid LAD has an eccentric 75% stenosis at the origin of the second diagonal branch. Beyond that area the mid and distal LAD are diffusely diseased. There is another severe stenosis in the apical LAD of 90%.  Left circumflex (LCx): The left circumflex is calcified. The vessel is patent with mild irregularity. There are no high-grade stenoses identified. There are scattered 30-40% stenoses throughout the proximal and mid circumflex as well as the first OM branch.  Right coronary artery (RCA): The RCA is dominant. The vessel is heavily calcified. The stented segments in the mid and distal RCA are patent. The proximal vessel has 30-40% stenosis. The mid vessel has 30-40% stenosis. The distal vessel is patent with 50-60% stenosis involving the origin of the PDA. The PDA is diffusely diseased.  Left ventriculography: Deferred because of chronic kidney disease. By nuclear scan the LVEF is 41%.  PCI Note: Following the diagnostic procedure, the decision was made to proceed with PCI of the mid and apical LAD. The right coronary artery had patent stents in the left circumflex had nonobstructive disease. The  LAD is a diffusely diseased vessel and the mid and distal vessel would be a poor surgical targets. I felt that PCI was the best option in this patient with a high risk nuclear scan demonstrating anteroapical and anterolateral ischemia. He will require colostomy reversal, so I planned on treating him with Synergy drug-eluting stents which had a biodegradable polymer and potentially can allow for earlier interruption of dual antiplatelet therapy. The patient was loaded with Plavix 600 mg. Weight-based  bivalirudin was given for anticoagulation. Once a therapeutic ACT was achieved, a 6 [place] XB LAD 3.5 cm guide catheter was inserted. A cougar coronary guidewire was used to cross the lesion in the apical LAD. The lesion was predilated with a 2.5 x 12 mm balloon. The same balloon was used to dilate the mid lesion. The apical lesion was then stented with a 2.25 x 16 mm Synergy DES. The stent was postdilated with a 2.5 mm noncompliant balloon. The mid lesion was then stented with a 3.5 x 16 mm Synergy DES. That stent was postdilated with a 3.75 mm noncompliant balloon. Following PCI, there was 0% residual stenosis and TIMI-3 flow. Final angiography confirmed an excellent result. The patient tolerated the procedure well. There were no immediate procedural complications. A TR band was used for radial hemostasis. The patient was transferred to the post catheterization recovery area for further monitoring.  PCI Data: Lesion 1: Vessel - LAD/Segment - distal/apical Percent Stenosis (pre) 90 TIMI-flow 3 Stent 2.25 x 16 mm Synergy DES Percent Stenosis (post) 0 TIMI-flow (post) 3  Lesion 2: Vessel - LAD/Segment - mid Percent Stenosis (pre) 75 TIMI-flow 3 Stent 3.5 x 16 mm Synergy DES Percent Stenosis (post) 0 TIMI-flow (post) 3  Estimated Blood Loss: Minimal  Final Conclusions:  1. Two-vessel coronary artery disease with continued patency of the stented segments in the right coronary artery and severe stenoses of the mid and apical LAD 2. Mild diffuse nonobstructive left circumflex stenosis 3. Successful PCI of the LAD using 2 drug-eluting stents   Recommendations:  Dual antiplatelet therapy for at least 6 months. Would be reasonable to consider interruption of aspirin and Plavix at 6 months for colostomy reversal.  [name] MD, [facility] 06/11/2014, 10:06 AM   Assessment / Plan:  1. Intermitted atrial tach - this has been a chronic issue - he is on CCB and beta blocker  therapy. He is totally asymptomatic. I suspect some of his elevated HR is also from severe deconditioning. Would leave him on this current regimen unless he has consistent elevation of his HR. He has seen EP - he is not a good candidate for any EP procedures nor AAD therapies are recommended. He is to be managed conservatively per Dr. Rayann Heman and I agree.   2.  CAD with prior PCI to the RCA - S/P cath with PCI to the LAD back in April of 2016 following high risk Myoview - has had DES x 2 to the LAD in 2016 -he remainson aspirin/Plavix.Follow up Myoview from 01/2016 was low risk.Would still advise CV risk factor modification but this remains quite challenging at best.  3.History of combined systolic and diastolic HF -last echo from 2018 with normal EF now noted. ?how much CCB therapy is playing a role but he has to be on this for better HR control. Adding back his Aldactone today. Lab today and again in one week.   4. Colon cancer - followed by oncology -has had attempt at colostomy reversal now with ileostomy in  place.This will be long term. Not discussed today  5. HTN - BP ok on current regimen  6. CKD - rechecking lab today.   7. OSA - now on CPAP and followed by Dr. Annamaria Boots- using sporadically.  8. HLD - on statin therapy  9. Recent GU procedures - plan per urology  Current medicines are reviewed with the patient today.  The patient does not have concerns regarding medicines other than what has been noted above.  The following changes have been made:  See above.  Labs/ tests ordered today include:    Orders Placed This Encounter  Procedures  . Basic metabolic panel  . Basic metabolic panel  . EKG 12-Lead     Disposition:   FU with me in 3 months. Lab today and repeat in one week.   Patient is agreeable to this plan and will call if any problems develop in the interim.   SignedTruitt Merle, NP  08/14/2017 11:35 AM  [facility] [address] [place], [place]  [address] Phone: [phone] Fax: [phone]

## 2017-08-15 ENCOUNTER — Other Ambulatory Visit: Payer: Self-pay | Admitting: *Deleted

## 2017-08-15 DIAGNOSIS — Z48815 Encounter for surgical aftercare following surgery on the digestive system: Secondary | ICD-10-CM | POA: Diagnosis not present

## 2017-08-15 DIAGNOSIS — B9689 Other specified bacterial agents as the cause of diseases classified elsewhere: Secondary | ICD-10-CM | POA: Diagnosis not present

## 2017-08-15 DIAGNOSIS — Z433 Encounter for attention to colostomy: Secondary | ICD-10-CM | POA: Diagnosis not present

## 2017-08-15 DIAGNOSIS — N390 Urinary tract infection, site not specified: Secondary | ICD-10-CM | POA: Diagnosis not present

## 2017-08-15 DIAGNOSIS — R339 Retention of urine, unspecified: Secondary | ICD-10-CM | POA: Diagnosis not present

## 2017-08-15 DIAGNOSIS — Z466 Encounter for fitting and adjustment of urinary device: Secondary | ICD-10-CM | POA: Diagnosis not present

## 2017-08-15 DIAGNOSIS — I10 Essential (primary) hypertension: Secondary | ICD-10-CM

## 2017-08-16 DIAGNOSIS — Z433 Encounter for attention to colostomy: Secondary | ICD-10-CM | POA: Diagnosis not present

## 2017-08-16 DIAGNOSIS — Z466 Encounter for fitting and adjustment of urinary device: Secondary | ICD-10-CM | POA: Diagnosis not present

## 2017-08-16 DIAGNOSIS — N390 Urinary tract infection, site not specified: Secondary | ICD-10-CM | POA: Diagnosis not present

## 2017-08-16 DIAGNOSIS — B9689 Other specified bacterial agents as the cause of diseases classified elsewhere: Secondary | ICD-10-CM | POA: Diagnosis not present

## 2017-08-16 DIAGNOSIS — Z48815 Encounter for surgical aftercare following surgery on the digestive system: Secondary | ICD-10-CM | POA: Diagnosis not present

## 2017-08-16 DIAGNOSIS — R339 Retention of urine, unspecified: Secondary | ICD-10-CM | POA: Diagnosis not present

## 2017-08-18 ENCOUNTER — Ambulatory Visit: Payer: Medicare Other | Admitting: Family

## 2017-08-18 ENCOUNTER — Encounter (HOSPITAL_COMMUNITY): Payer: Medicare Other

## 2017-08-18 DIAGNOSIS — Z48815 Encounter for surgical aftercare following surgery on the digestive system: Secondary | ICD-10-CM | POA: Diagnosis not present

## 2017-08-18 DIAGNOSIS — R339 Retention of urine, unspecified: Secondary | ICD-10-CM | POA: Diagnosis not present

## 2017-08-18 DIAGNOSIS — B9689 Other specified bacterial agents as the cause of diseases classified elsewhere: Secondary | ICD-10-CM | POA: Diagnosis not present

## 2017-08-18 DIAGNOSIS — Z466 Encounter for fitting and adjustment of urinary device: Secondary | ICD-10-CM | POA: Diagnosis not present

## 2017-08-18 DIAGNOSIS — Z433 Encounter for attention to colostomy: Secondary | ICD-10-CM | POA: Diagnosis not present

## 2017-08-18 DIAGNOSIS — N390 Urinary tract infection, site not specified: Secondary | ICD-10-CM | POA: Diagnosis not present

## 2017-08-21 ENCOUNTER — Other Ambulatory Visit: Payer: Medicare Other | Admitting: *Deleted

## 2017-08-21 DIAGNOSIS — I10 Essential (primary) hypertension: Secondary | ICD-10-CM | POA: Diagnosis not present

## 2017-08-21 DIAGNOSIS — B9689 Other specified bacterial agents as the cause of diseases classified elsewhere: Secondary | ICD-10-CM | POA: Diagnosis not present

## 2017-08-21 DIAGNOSIS — Z466 Encounter for fitting and adjustment of urinary device: Secondary | ICD-10-CM | POA: Diagnosis not present

## 2017-08-21 DIAGNOSIS — Z433 Encounter for attention to colostomy: Secondary | ICD-10-CM | POA: Diagnosis not present

## 2017-08-21 DIAGNOSIS — N390 Urinary tract infection, site not specified: Secondary | ICD-10-CM | POA: Diagnosis not present

## 2017-08-21 DIAGNOSIS — R339 Retention of urine, unspecified: Secondary | ICD-10-CM | POA: Diagnosis not present

## 2017-08-21 DIAGNOSIS — Z48815 Encounter for surgical aftercare following surgery on the digestive system: Secondary | ICD-10-CM | POA: Diagnosis not present

## 2017-08-22 DIAGNOSIS — R339 Retention of urine, unspecified: Secondary | ICD-10-CM | POA: Diagnosis not present

## 2017-08-22 DIAGNOSIS — N390 Urinary tract infection, site not specified: Secondary | ICD-10-CM | POA: Diagnosis not present

## 2017-08-22 DIAGNOSIS — Z466 Encounter for fitting and adjustment of urinary device: Secondary | ICD-10-CM | POA: Diagnosis not present

## 2017-08-22 DIAGNOSIS — B9689 Other specified bacterial agents as the cause of diseases classified elsewhere: Secondary | ICD-10-CM | POA: Diagnosis not present

## 2017-08-22 DIAGNOSIS — Z48815 Encounter for surgical aftercare following surgery on the digestive system: Secondary | ICD-10-CM | POA: Diagnosis not present

## 2017-08-22 DIAGNOSIS — Z433 Encounter for attention to colostomy: Secondary | ICD-10-CM | POA: Diagnosis not present

## 2017-08-22 LAB — BASIC METABOLIC PANEL
BUN/Creatinine Ratio: 10 (ref 10–24)
BUN: 17 mg/dL (ref 8–27)
CO2: 23 mmol/L (ref 20–29)
Calcium: 10.3 mg/dL — ABNORMAL HIGH (ref 8.6–10.2)
Chloride: 106 mmol/L (ref 96–106)
Creatinine, Ser: 1.78 mg/dL — ABNORMAL HIGH (ref 0.76–1.27)
GFR calc Af Amer: 46 mL/min/{1.73_m2} — ABNORMAL LOW (ref >59)
GFR calc non Af Amer: 39 mL/min/{1.73_m2} — ABNORMAL LOW (ref >59)
Glucose: 86 mg/dL (ref 65–99)
Potassium: 3.9 mmol/L (ref 3.5–5.2)
Sodium: 144 mmol/L (ref 134–144)

## 2017-08-23 DIAGNOSIS — B9689 Other specified bacterial agents as the cause of diseases classified elsewhere: Secondary | ICD-10-CM | POA: Diagnosis not present

## 2017-08-23 DIAGNOSIS — R339 Retention of urine, unspecified: Secondary | ICD-10-CM | POA: Diagnosis not present

## 2017-08-23 DIAGNOSIS — N390 Urinary tract infection, site not specified: Secondary | ICD-10-CM | POA: Diagnosis not present

## 2017-08-23 DIAGNOSIS — Z48815 Encounter for surgical aftercare following surgery on the digestive system: Secondary | ICD-10-CM | POA: Diagnosis not present

## 2017-08-23 DIAGNOSIS — Z433 Encounter for attention to colostomy: Secondary | ICD-10-CM | POA: Diagnosis not present

## 2017-08-23 DIAGNOSIS — Z466 Encounter for fitting and adjustment of urinary device: Secondary | ICD-10-CM | POA: Diagnosis not present

## 2017-08-25 DIAGNOSIS — R339 Retention of urine, unspecified: Secondary | ICD-10-CM | POA: Diagnosis not present

## 2017-08-25 DIAGNOSIS — Z48815 Encounter for surgical aftercare following surgery on the digestive system: Secondary | ICD-10-CM | POA: Diagnosis not present

## 2017-08-25 DIAGNOSIS — Z466 Encounter for fitting and adjustment of urinary device: Secondary | ICD-10-CM | POA: Diagnosis not present

## 2017-08-25 DIAGNOSIS — Z433 Encounter for attention to colostomy: Secondary | ICD-10-CM | POA: Diagnosis not present

## 2017-08-25 DIAGNOSIS — N390 Urinary tract infection, site not specified: Secondary | ICD-10-CM | POA: Diagnosis not present

## 2017-08-25 DIAGNOSIS — B9689 Other specified bacterial agents as the cause of diseases classified elsewhere: Secondary | ICD-10-CM | POA: Diagnosis not present

## 2017-08-29 DIAGNOSIS — Z48815 Encounter for surgical aftercare following surgery on the digestive system: Secondary | ICD-10-CM | POA: Diagnosis not present

## 2017-08-29 DIAGNOSIS — Z466 Encounter for fitting and adjustment of urinary device: Secondary | ICD-10-CM | POA: Diagnosis not present

## 2017-08-29 DIAGNOSIS — N390 Urinary tract infection, site not specified: Secondary | ICD-10-CM | POA: Diagnosis not present

## 2017-08-29 DIAGNOSIS — B9689 Other specified bacterial agents as the cause of diseases classified elsewhere: Secondary | ICD-10-CM | POA: Diagnosis not present

## 2017-08-29 DIAGNOSIS — R339 Retention of urine, unspecified: Secondary | ICD-10-CM | POA: Diagnosis not present

## 2017-08-29 DIAGNOSIS — Z433 Encounter for attention to colostomy: Secondary | ICD-10-CM | POA: Diagnosis not present

## 2017-08-31 DIAGNOSIS — Z466 Encounter for fitting and adjustment of urinary device: Secondary | ICD-10-CM | POA: Diagnosis not present

## 2017-08-31 DIAGNOSIS — N390 Urinary tract infection, site not specified: Secondary | ICD-10-CM | POA: Diagnosis not present

## 2017-08-31 DIAGNOSIS — Z433 Encounter for attention to colostomy: Secondary | ICD-10-CM | POA: Diagnosis not present

## 2017-08-31 DIAGNOSIS — Z48815 Encounter for surgical aftercare following surgery on the digestive system: Secondary | ICD-10-CM | POA: Diagnosis not present

## 2017-08-31 DIAGNOSIS — R339 Retention of urine, unspecified: Secondary | ICD-10-CM | POA: Diagnosis not present

## 2017-08-31 DIAGNOSIS — B9689 Other specified bacterial agents as the cause of diseases classified elsewhere: Secondary | ICD-10-CM | POA: Diagnosis not present

## 2017-09-01 DIAGNOSIS — Z466 Encounter for fitting and adjustment of urinary device: Secondary | ICD-10-CM | POA: Diagnosis not present

## 2017-09-01 DIAGNOSIS — Z433 Encounter for attention to colostomy: Secondary | ICD-10-CM | POA: Diagnosis not present

## 2017-09-01 DIAGNOSIS — Z48815 Encounter for surgical aftercare following surgery on the digestive system: Secondary | ICD-10-CM | POA: Diagnosis not present

## 2017-09-01 DIAGNOSIS — B9689 Other specified bacterial agents as the cause of diseases classified elsewhere: Secondary | ICD-10-CM | POA: Diagnosis not present

## 2017-09-01 DIAGNOSIS — N390 Urinary tract infection, site not specified: Secondary | ICD-10-CM | POA: Diagnosis not present

## 2017-09-01 DIAGNOSIS — R339 Retention of urine, unspecified: Secondary | ICD-10-CM | POA: Diagnosis not present

## 2017-09-05 DIAGNOSIS — Z433 Encounter for attention to colostomy: Secondary | ICD-10-CM | POA: Diagnosis not present

## 2017-09-05 DIAGNOSIS — Z48815 Encounter for surgical aftercare following surgery on the digestive system: Secondary | ICD-10-CM | POA: Diagnosis not present

## 2017-09-05 DIAGNOSIS — B9689 Other specified bacterial agents as the cause of diseases classified elsewhere: Secondary | ICD-10-CM | POA: Diagnosis not present

## 2017-09-05 DIAGNOSIS — N390 Urinary tract infection, site not specified: Secondary | ICD-10-CM | POA: Diagnosis not present

## 2017-09-05 DIAGNOSIS — R339 Retention of urine, unspecified: Secondary | ICD-10-CM | POA: Diagnosis not present

## 2017-09-05 DIAGNOSIS — Z466 Encounter for fitting and adjustment of urinary device: Secondary | ICD-10-CM | POA: Diagnosis not present

## 2017-09-06 ENCOUNTER — Ambulatory Visit: Payer: Medicare Other | Admitting: Nurse Practitioner

## 2017-09-07 DIAGNOSIS — Z433 Encounter for attention to colostomy: Secondary | ICD-10-CM | POA: Diagnosis not present

## 2017-09-07 DIAGNOSIS — R339 Retention of urine, unspecified: Secondary | ICD-10-CM | POA: Diagnosis not present

## 2017-09-07 DIAGNOSIS — Z48815 Encounter for surgical aftercare following surgery on the digestive system: Secondary | ICD-10-CM | POA: Diagnosis not present

## 2017-09-07 DIAGNOSIS — Z466 Encounter for fitting and adjustment of urinary device: Secondary | ICD-10-CM | POA: Diagnosis not present

## 2017-09-07 DIAGNOSIS — B9689 Other specified bacterial agents as the cause of diseases classified elsewhere: Secondary | ICD-10-CM | POA: Diagnosis not present

## 2017-09-07 DIAGNOSIS — N390 Urinary tract infection, site not specified: Secondary | ICD-10-CM | POA: Diagnosis not present

## 2017-09-08 DIAGNOSIS — Z466 Encounter for fitting and adjustment of urinary device: Secondary | ICD-10-CM | POA: Diagnosis not present

## 2017-09-08 DIAGNOSIS — Z433 Encounter for attention to colostomy: Secondary | ICD-10-CM | POA: Diagnosis not present

## 2017-09-08 DIAGNOSIS — B9689 Other specified bacterial agents as the cause of diseases classified elsewhere: Secondary | ICD-10-CM | POA: Diagnosis not present

## 2017-09-08 DIAGNOSIS — Z48815 Encounter for surgical aftercare following surgery on the digestive system: Secondary | ICD-10-CM | POA: Diagnosis not present

## 2017-09-08 DIAGNOSIS — N390 Urinary tract infection, site not specified: Secondary | ICD-10-CM | POA: Diagnosis not present

## 2017-09-08 DIAGNOSIS — R339 Retention of urine, unspecified: Secondary | ICD-10-CM | POA: Diagnosis not present

## 2017-09-11 DIAGNOSIS — N390 Urinary tract infection, site not specified: Secondary | ICD-10-CM | POA: Diagnosis not present

## 2017-09-11 DIAGNOSIS — B9689 Other specified bacterial agents as the cause of diseases classified elsewhere: Secondary | ICD-10-CM | POA: Diagnosis not present

## 2017-09-11 DIAGNOSIS — Z433 Encounter for attention to colostomy: Secondary | ICD-10-CM | POA: Diagnosis not present

## 2017-09-11 DIAGNOSIS — Z466 Encounter for fitting and adjustment of urinary device: Secondary | ICD-10-CM | POA: Diagnosis not present

## 2017-09-11 DIAGNOSIS — Z48815 Encounter for surgical aftercare following surgery on the digestive system: Secondary | ICD-10-CM | POA: Diagnosis not present

## 2017-09-11 DIAGNOSIS — R339 Retention of urine, unspecified: Secondary | ICD-10-CM | POA: Diagnosis not present

## 2017-09-12 DIAGNOSIS — Z48815 Encounter for surgical aftercare following surgery on the digestive system: Secondary | ICD-10-CM | POA: Diagnosis not present

## 2017-09-12 DIAGNOSIS — N390 Urinary tract infection, site not specified: Secondary | ICD-10-CM | POA: Diagnosis not present

## 2017-09-12 DIAGNOSIS — Z466 Encounter for fitting and adjustment of urinary device: Secondary | ICD-10-CM | POA: Diagnosis not present

## 2017-09-12 DIAGNOSIS — Z433 Encounter for attention to colostomy: Secondary | ICD-10-CM | POA: Diagnosis not present

## 2017-09-12 DIAGNOSIS — R339 Retention of urine, unspecified: Secondary | ICD-10-CM | POA: Diagnosis not present

## 2017-09-12 DIAGNOSIS — B9689 Other specified bacterial agents as the cause of diseases classified elsewhere: Secondary | ICD-10-CM | POA: Diagnosis not present

## 2017-09-14 DIAGNOSIS — N201 Calculus of ureter: Secondary | ICD-10-CM | POA: Diagnosis not present

## 2017-09-14 DIAGNOSIS — Z48815 Encounter for surgical aftercare following surgery on the digestive system: Secondary | ICD-10-CM | POA: Diagnosis not present

## 2017-09-14 DIAGNOSIS — Z466 Encounter for fitting and adjustment of urinary device: Secondary | ICD-10-CM | POA: Diagnosis not present

## 2017-09-14 DIAGNOSIS — N390 Urinary tract infection, site not specified: Secondary | ICD-10-CM | POA: Diagnosis not present

## 2017-09-14 DIAGNOSIS — R339 Retention of urine, unspecified: Secondary | ICD-10-CM | POA: Diagnosis not present

## 2017-09-14 DIAGNOSIS — B9689 Other specified bacterial agents as the cause of diseases classified elsewhere: Secondary | ICD-10-CM | POA: Diagnosis not present

## 2017-09-14 DIAGNOSIS — Z433 Encounter for attention to colostomy: Secondary | ICD-10-CM | POA: Diagnosis not present

## 2017-09-15 DIAGNOSIS — Z433 Encounter for attention to colostomy: Secondary | ICD-10-CM | POA: Diagnosis not present

## 2017-09-15 DIAGNOSIS — Z466 Encounter for fitting and adjustment of urinary device: Secondary | ICD-10-CM | POA: Diagnosis not present

## 2017-09-15 DIAGNOSIS — B9689 Other specified bacterial agents as the cause of diseases classified elsewhere: Secondary | ICD-10-CM | POA: Diagnosis not present

## 2017-09-15 DIAGNOSIS — R339 Retention of urine, unspecified: Secondary | ICD-10-CM | POA: Diagnosis not present

## 2017-09-15 DIAGNOSIS — N390 Urinary tract infection, site not specified: Secondary | ICD-10-CM | POA: Diagnosis not present

## 2017-09-15 DIAGNOSIS — Z48815 Encounter for surgical aftercare following surgery on the digestive system: Secondary | ICD-10-CM | POA: Diagnosis not present

## 2017-09-16 DIAGNOSIS — Z951 Presence of aortocoronary bypass graft: Secondary | ICD-10-CM | POA: Diagnosis not present

## 2017-09-16 DIAGNOSIS — Z6841 Body Mass Index (BMI) 40.0 and over, adult: Secondary | ICD-10-CM | POA: Diagnosis not present

## 2017-09-16 DIAGNOSIS — I255 Ischemic cardiomyopathy: Secondary | ICD-10-CM | POA: Diagnosis not present

## 2017-09-16 DIAGNOSIS — I5042 Chronic combined systolic (congestive) and diastolic (congestive) heart failure: Secondary | ICD-10-CM | POA: Diagnosis not present

## 2017-09-16 DIAGNOSIS — Z7902 Long term (current) use of antithrombotics/antiplatelets: Secondary | ICD-10-CM | POA: Diagnosis not present

## 2017-09-16 DIAGNOSIS — Z87440 Personal history of urinary (tract) infections: Secondary | ICD-10-CM | POA: Diagnosis not present

## 2017-09-16 DIAGNOSIS — N183 Chronic kidney disease, stage 3 (moderate): Secondary | ICD-10-CM | POA: Diagnosis not present

## 2017-09-16 DIAGNOSIS — Z932 Ileostomy status: Secondary | ICD-10-CM | POA: Diagnosis not present

## 2017-09-16 DIAGNOSIS — Z85038 Personal history of other malignant neoplasm of large intestine: Secondary | ICD-10-CM | POA: Diagnosis not present

## 2017-09-16 DIAGNOSIS — Z433 Encounter for attention to colostomy: Secondary | ICD-10-CM | POA: Diagnosis not present

## 2017-09-16 DIAGNOSIS — Z4801 Encounter for change or removal of surgical wound dressing: Secondary | ICD-10-CM | POA: Diagnosis not present

## 2017-09-16 DIAGNOSIS — Z48815 Encounter for surgical aftercare following surgery on the digestive system: Secondary | ICD-10-CM | POA: Diagnosis not present

## 2017-09-16 DIAGNOSIS — E669 Obesity, unspecified: Secondary | ICD-10-CM | POA: Diagnosis not present

## 2017-09-16 DIAGNOSIS — I739 Peripheral vascular disease, unspecified: Secondary | ICD-10-CM | POA: Diagnosis not present

## 2017-09-16 DIAGNOSIS — Z960 Presence of urogenital implants: Secondary | ICD-10-CM | POA: Diagnosis not present

## 2017-09-16 DIAGNOSIS — I130 Hypertensive heart and chronic kidney disease with heart failure and stage 1 through stage 4 chronic kidney disease, or unspecified chronic kidney disease: Secondary | ICD-10-CM | POA: Diagnosis not present

## 2017-09-16 DIAGNOSIS — Z792 Long term (current) use of antibiotics: Secondary | ICD-10-CM | POA: Diagnosis not present

## 2017-09-16 DIAGNOSIS — J449 Chronic obstructive pulmonary disease, unspecified: Secondary | ICD-10-CM | POA: Diagnosis not present

## 2017-09-16 DIAGNOSIS — I4891 Unspecified atrial fibrillation: Secondary | ICD-10-CM | POA: Diagnosis not present

## 2017-09-16 DIAGNOSIS — I2510 Atherosclerotic heart disease of native coronary artery without angina pectoris: Secondary | ICD-10-CM | POA: Diagnosis not present

## 2017-09-19 DIAGNOSIS — Z433 Encounter for attention to colostomy: Secondary | ICD-10-CM | POA: Diagnosis not present

## 2017-09-19 DIAGNOSIS — I2510 Atherosclerotic heart disease of native coronary artery without angina pectoris: Secondary | ICD-10-CM | POA: Diagnosis not present

## 2017-09-19 DIAGNOSIS — Z48815 Encounter for surgical aftercare following surgery on the digestive system: Secondary | ICD-10-CM | POA: Diagnosis not present

## 2017-09-19 DIAGNOSIS — I5042 Chronic combined systolic (congestive) and diastolic (congestive) heart failure: Secondary | ICD-10-CM | POA: Diagnosis not present

## 2017-09-19 DIAGNOSIS — I130 Hypertensive heart and chronic kidney disease with heart failure and stage 1 through stage 4 chronic kidney disease, or unspecified chronic kidney disease: Secondary | ICD-10-CM | POA: Diagnosis not present

## 2017-09-19 DIAGNOSIS — N183 Chronic kidney disease, stage 3 (moderate): Secondary | ICD-10-CM | POA: Diagnosis not present

## 2017-09-22 DIAGNOSIS — I2510 Atherosclerotic heart disease of native coronary artery without angina pectoris: Secondary | ICD-10-CM | POA: Diagnosis not present

## 2017-09-22 DIAGNOSIS — I130 Hypertensive heart and chronic kidney disease with heart failure and stage 1 through stage 4 chronic kidney disease, or unspecified chronic kidney disease: Secondary | ICD-10-CM | POA: Diagnosis not present

## 2017-09-22 DIAGNOSIS — N183 Chronic kidney disease, stage 3 (moderate): Secondary | ICD-10-CM | POA: Diagnosis not present

## 2017-09-22 DIAGNOSIS — Z433 Encounter for attention to colostomy: Secondary | ICD-10-CM | POA: Diagnosis not present

## 2017-09-22 DIAGNOSIS — Z48815 Encounter for surgical aftercare following surgery on the digestive system: Secondary | ICD-10-CM | POA: Diagnosis not present

## 2017-09-22 DIAGNOSIS — I5042 Chronic combined systolic (congestive) and diastolic (congestive) heart failure: Secondary | ICD-10-CM | POA: Diagnosis not present

## 2017-09-26 DIAGNOSIS — Z48815 Encounter for surgical aftercare following surgery on the digestive system: Secondary | ICD-10-CM | POA: Diagnosis not present

## 2017-09-26 DIAGNOSIS — I2510 Atherosclerotic heart disease of native coronary artery without angina pectoris: Secondary | ICD-10-CM | POA: Diagnosis not present

## 2017-09-26 DIAGNOSIS — I5042 Chronic combined systolic (congestive) and diastolic (congestive) heart failure: Secondary | ICD-10-CM | POA: Diagnosis not present

## 2017-09-26 DIAGNOSIS — I130 Hypertensive heart and chronic kidney disease with heart failure and stage 1 through stage 4 chronic kidney disease, or unspecified chronic kidney disease: Secondary | ICD-10-CM | POA: Diagnosis not present

## 2017-09-26 DIAGNOSIS — Z433 Encounter for attention to colostomy: Secondary | ICD-10-CM | POA: Diagnosis not present

## 2017-09-26 DIAGNOSIS — N183 Chronic kidney disease, stage 3 (moderate): Secondary | ICD-10-CM | POA: Diagnosis not present

## 2017-09-27 ENCOUNTER — Other Ambulatory Visit: Payer: Self-pay | Admitting: Nurse Practitioner

## 2017-09-29 DIAGNOSIS — Z433 Encounter for attention to colostomy: Secondary | ICD-10-CM | POA: Diagnosis not present

## 2017-09-29 DIAGNOSIS — Z48815 Encounter for surgical aftercare following surgery on the digestive system: Secondary | ICD-10-CM | POA: Diagnosis not present

## 2017-09-29 DIAGNOSIS — I5042 Chronic combined systolic (congestive) and diastolic (congestive) heart failure: Secondary | ICD-10-CM | POA: Diagnosis not present

## 2017-09-29 DIAGNOSIS — I130 Hypertensive heart and chronic kidney disease with heart failure and stage 1 through stage 4 chronic kidney disease, or unspecified chronic kidney disease: Secondary | ICD-10-CM | POA: Diagnosis not present

## 2017-09-29 DIAGNOSIS — N183 Chronic kidney disease, stage 3 (moderate): Secondary | ICD-10-CM | POA: Diagnosis not present

## 2017-09-29 DIAGNOSIS — I2510 Atherosclerotic heart disease of native coronary artery without angina pectoris: Secondary | ICD-10-CM | POA: Diagnosis not present

## 2017-10-03 DIAGNOSIS — I130 Hypertensive heart and chronic kidney disease with heart failure and stage 1 through stage 4 chronic kidney disease, or unspecified chronic kidney disease: Secondary | ICD-10-CM | POA: Diagnosis not present

## 2017-10-03 DIAGNOSIS — Z433 Encounter for attention to colostomy: Secondary | ICD-10-CM | POA: Diagnosis not present

## 2017-10-03 DIAGNOSIS — Z48815 Encounter for surgical aftercare following surgery on the digestive system: Secondary | ICD-10-CM | POA: Diagnosis not present

## 2017-10-03 DIAGNOSIS — I5042 Chronic combined systolic (congestive) and diastolic (congestive) heart failure: Secondary | ICD-10-CM | POA: Diagnosis not present

## 2017-10-03 DIAGNOSIS — N183 Chronic kidney disease, stage 3 (moderate): Secondary | ICD-10-CM | POA: Diagnosis not present

## 2017-10-03 DIAGNOSIS — I2510 Atherosclerotic heart disease of native coronary artery without angina pectoris: Secondary | ICD-10-CM | POA: Diagnosis not present

## 2017-10-06 DIAGNOSIS — I2510 Atherosclerotic heart disease of native coronary artery without angina pectoris: Secondary | ICD-10-CM | POA: Diagnosis not present

## 2017-10-06 DIAGNOSIS — I5042 Chronic combined systolic (congestive) and diastolic (congestive) heart failure: Secondary | ICD-10-CM | POA: Diagnosis not present

## 2017-10-06 DIAGNOSIS — Z433 Encounter for attention to colostomy: Secondary | ICD-10-CM | POA: Diagnosis not present

## 2017-10-06 DIAGNOSIS — Z48815 Encounter for surgical aftercare following surgery on the digestive system: Secondary | ICD-10-CM | POA: Diagnosis not present

## 2017-10-06 DIAGNOSIS — N183 Chronic kidney disease, stage 3 (moderate): Secondary | ICD-10-CM | POA: Diagnosis not present

## 2017-10-06 DIAGNOSIS — I130 Hypertensive heart and chronic kidney disease with heart failure and stage 1 through stage 4 chronic kidney disease, or unspecified chronic kidney disease: Secondary | ICD-10-CM | POA: Diagnosis not present

## 2017-10-10 DIAGNOSIS — I130 Hypertensive heart and chronic kidney disease with heart failure and stage 1 through stage 4 chronic kidney disease, or unspecified chronic kidney disease: Secondary | ICD-10-CM | POA: Diagnosis not present

## 2017-10-10 DIAGNOSIS — Z48815 Encounter for surgical aftercare following surgery on the digestive system: Secondary | ICD-10-CM | POA: Diagnosis not present

## 2017-10-10 DIAGNOSIS — N183 Chronic kidney disease, stage 3 (moderate): Secondary | ICD-10-CM | POA: Diagnosis not present

## 2017-10-10 DIAGNOSIS — Z433 Encounter for attention to colostomy: Secondary | ICD-10-CM | POA: Diagnosis not present

## 2017-10-10 DIAGNOSIS — I2510 Atherosclerotic heart disease of native coronary artery without angina pectoris: Secondary | ICD-10-CM | POA: Diagnosis not present

## 2017-10-10 DIAGNOSIS — I5042 Chronic combined systolic (congestive) and diastolic (congestive) heart failure: Secondary | ICD-10-CM | POA: Diagnosis not present

## 2017-10-13 DIAGNOSIS — Z433 Encounter for attention to colostomy: Secondary | ICD-10-CM | POA: Diagnosis not present

## 2017-10-13 DIAGNOSIS — I2510 Atherosclerotic heart disease of native coronary artery without angina pectoris: Secondary | ICD-10-CM | POA: Diagnosis not present

## 2017-10-13 DIAGNOSIS — Z48815 Encounter for surgical aftercare following surgery on the digestive system: Secondary | ICD-10-CM | POA: Diagnosis not present

## 2017-10-13 DIAGNOSIS — I130 Hypertensive heart and chronic kidney disease with heart failure and stage 1 through stage 4 chronic kidney disease, or unspecified chronic kidney disease: Secondary | ICD-10-CM | POA: Diagnosis not present

## 2017-10-13 DIAGNOSIS — I5042 Chronic combined systolic (congestive) and diastolic (congestive) heart failure: Secondary | ICD-10-CM | POA: Diagnosis not present

## 2017-10-13 DIAGNOSIS — N183 Chronic kidney disease, stage 3 (moderate): Secondary | ICD-10-CM | POA: Diagnosis not present

## 2017-10-17 DIAGNOSIS — I2510 Atherosclerotic heart disease of native coronary artery without angina pectoris: Secondary | ICD-10-CM | POA: Diagnosis not present

## 2017-10-17 DIAGNOSIS — I5042 Chronic combined systolic (congestive) and diastolic (congestive) heart failure: Secondary | ICD-10-CM | POA: Diagnosis not present

## 2017-10-17 DIAGNOSIS — N183 Chronic kidney disease, stage 3 (moderate): Secondary | ICD-10-CM | POA: Diagnosis not present

## 2017-10-17 DIAGNOSIS — Z433 Encounter for attention to colostomy: Secondary | ICD-10-CM | POA: Diagnosis not present

## 2017-10-17 DIAGNOSIS — Z48815 Encounter for surgical aftercare following surgery on the digestive system: Secondary | ICD-10-CM | POA: Diagnosis not present

## 2017-10-17 DIAGNOSIS — I130 Hypertensive heart and chronic kidney disease with heart failure and stage 1 through stage 4 chronic kidney disease, or unspecified chronic kidney disease: Secondary | ICD-10-CM | POA: Diagnosis not present

## 2017-10-20 DIAGNOSIS — N183 Chronic kidney disease, stage 3 (moderate): Secondary | ICD-10-CM | POA: Diagnosis not present

## 2017-10-20 DIAGNOSIS — I2510 Atherosclerotic heart disease of native coronary artery without angina pectoris: Secondary | ICD-10-CM | POA: Diagnosis not present

## 2017-10-20 DIAGNOSIS — I130 Hypertensive heart and chronic kidney disease with heart failure and stage 1 through stage 4 chronic kidney disease, or unspecified chronic kidney disease: Secondary | ICD-10-CM | POA: Diagnosis not present

## 2017-10-20 DIAGNOSIS — Z48815 Encounter for surgical aftercare following surgery on the digestive system: Secondary | ICD-10-CM | POA: Diagnosis not present

## 2017-10-20 DIAGNOSIS — I5042 Chronic combined systolic (congestive) and diastolic (congestive) heart failure: Secondary | ICD-10-CM | POA: Diagnosis not present

## 2017-10-20 DIAGNOSIS — Z433 Encounter for attention to colostomy: Secondary | ICD-10-CM | POA: Diagnosis not present

## 2017-10-24 DIAGNOSIS — I2510 Atherosclerotic heart disease of native coronary artery without angina pectoris: Secondary | ICD-10-CM | POA: Diagnosis not present

## 2017-10-24 DIAGNOSIS — I5042 Chronic combined systolic (congestive) and diastolic (congestive) heart failure: Secondary | ICD-10-CM | POA: Diagnosis not present

## 2017-10-24 DIAGNOSIS — I130 Hypertensive heart and chronic kidney disease with heart failure and stage 1 through stage 4 chronic kidney disease, or unspecified chronic kidney disease: Secondary | ICD-10-CM | POA: Diagnosis not present

## 2017-10-24 DIAGNOSIS — Z433 Encounter for attention to colostomy: Secondary | ICD-10-CM | POA: Diagnosis not present

## 2017-10-24 DIAGNOSIS — Z48815 Encounter for surgical aftercare following surgery on the digestive system: Secondary | ICD-10-CM | POA: Diagnosis not present

## 2017-10-24 DIAGNOSIS — N183 Chronic kidney disease, stage 3 (moderate): Secondary | ICD-10-CM | POA: Diagnosis not present

## 2017-10-27 DIAGNOSIS — Z433 Encounter for attention to colostomy: Secondary | ICD-10-CM | POA: Diagnosis not present

## 2017-10-27 DIAGNOSIS — I2510 Atherosclerotic heart disease of native coronary artery without angina pectoris: Secondary | ICD-10-CM | POA: Diagnosis not present

## 2017-10-27 DIAGNOSIS — I5042 Chronic combined systolic (congestive) and diastolic (congestive) heart failure: Secondary | ICD-10-CM | POA: Diagnosis not present

## 2017-10-27 DIAGNOSIS — N183 Chronic kidney disease, stage 3 (moderate): Secondary | ICD-10-CM | POA: Diagnosis not present

## 2017-10-27 DIAGNOSIS — Z48815 Encounter for surgical aftercare following surgery on the digestive system: Secondary | ICD-10-CM | POA: Diagnosis not present

## 2017-10-27 DIAGNOSIS — I130 Hypertensive heart and chronic kidney disease with heart failure and stage 1 through stage 4 chronic kidney disease, or unspecified chronic kidney disease: Secondary | ICD-10-CM | POA: Diagnosis not present

## 2017-10-31 DIAGNOSIS — N183 Chronic kidney disease, stage 3 (moderate): Secondary | ICD-10-CM | POA: Diagnosis not present

## 2017-10-31 DIAGNOSIS — I130 Hypertensive heart and chronic kidney disease with heart failure and stage 1 through stage 4 chronic kidney disease, or unspecified chronic kidney disease: Secondary | ICD-10-CM | POA: Diagnosis not present

## 2017-10-31 DIAGNOSIS — I2510 Atherosclerotic heart disease of native coronary artery without angina pectoris: Secondary | ICD-10-CM | POA: Diagnosis not present

## 2017-10-31 DIAGNOSIS — Z48815 Encounter for surgical aftercare following surgery on the digestive system: Secondary | ICD-10-CM | POA: Diagnosis not present

## 2017-10-31 DIAGNOSIS — Z433 Encounter for attention to colostomy: Secondary | ICD-10-CM | POA: Diagnosis not present

## 2017-10-31 DIAGNOSIS — I5042 Chronic combined systolic (congestive) and diastolic (congestive) heart failure: Secondary | ICD-10-CM | POA: Diagnosis not present

## 2017-11-03 DIAGNOSIS — I130 Hypertensive heart and chronic kidney disease with heart failure and stage 1 through stage 4 chronic kidney disease, or unspecified chronic kidney disease: Secondary | ICD-10-CM | POA: Diagnosis not present

## 2017-11-03 DIAGNOSIS — N183 Chronic kidney disease, stage 3 (moderate): Secondary | ICD-10-CM | POA: Diagnosis not present

## 2017-11-03 DIAGNOSIS — I5042 Chronic combined systolic (congestive) and diastolic (congestive) heart failure: Secondary | ICD-10-CM | POA: Diagnosis not present

## 2017-11-03 DIAGNOSIS — I2510 Atherosclerotic heart disease of native coronary artery without angina pectoris: Secondary | ICD-10-CM | POA: Diagnosis not present

## 2017-11-03 DIAGNOSIS — Z433 Encounter for attention to colostomy: Secondary | ICD-10-CM | POA: Diagnosis not present

## 2017-11-03 DIAGNOSIS — Z48815 Encounter for surgical aftercare following surgery on the digestive system: Secondary | ICD-10-CM | POA: Diagnosis not present

## 2017-11-07 DIAGNOSIS — I130 Hypertensive heart and chronic kidney disease with heart failure and stage 1 through stage 4 chronic kidney disease, or unspecified chronic kidney disease: Secondary | ICD-10-CM | POA: Diagnosis not present

## 2017-11-07 DIAGNOSIS — I5042 Chronic combined systolic (congestive) and diastolic (congestive) heart failure: Secondary | ICD-10-CM | POA: Diagnosis not present

## 2017-11-07 DIAGNOSIS — I2510 Atherosclerotic heart disease of native coronary artery without angina pectoris: Secondary | ICD-10-CM | POA: Diagnosis not present

## 2017-11-07 DIAGNOSIS — N183 Chronic kidney disease, stage 3 (moderate): Secondary | ICD-10-CM | POA: Diagnosis not present

## 2017-11-07 DIAGNOSIS — Z48815 Encounter for surgical aftercare following surgery on the digestive system: Secondary | ICD-10-CM | POA: Diagnosis not present

## 2017-11-07 DIAGNOSIS — Z433 Encounter for attention to colostomy: Secondary | ICD-10-CM | POA: Diagnosis not present

## 2017-11-08 ENCOUNTER — Other Ambulatory Visit: Payer: Self-pay | Admitting: Nurse Practitioner

## 2017-11-10 DIAGNOSIS — I130 Hypertensive heart and chronic kidney disease with heart failure and stage 1 through stage 4 chronic kidney disease, or unspecified chronic kidney disease: Secondary | ICD-10-CM | POA: Diagnosis not present

## 2017-11-10 DIAGNOSIS — Z433 Encounter for attention to colostomy: Secondary | ICD-10-CM | POA: Diagnosis not present

## 2017-11-10 DIAGNOSIS — N183 Chronic kidney disease, stage 3 (moderate): Secondary | ICD-10-CM | POA: Diagnosis not present

## 2017-11-10 DIAGNOSIS — I5042 Chronic combined systolic (congestive) and diastolic (congestive) heart failure: Secondary | ICD-10-CM | POA: Diagnosis not present

## 2017-11-10 DIAGNOSIS — I2510 Atherosclerotic heart disease of native coronary artery without angina pectoris: Secondary | ICD-10-CM | POA: Diagnosis not present

## 2017-11-10 DIAGNOSIS — Z48815 Encounter for surgical aftercare following surgery on the digestive system: Secondary | ICD-10-CM | POA: Diagnosis not present

## 2017-11-10 NOTE — Telephone Encounter (Signed)
Should the patient still be taking 60 mg qd? Just wanted to clarify as pharmacy is requesting 40 mg qd. Please advise. Thanks, MI

## 2017-11-14 DIAGNOSIS — I130 Hypertensive heart and chronic kidney disease with heart failure and stage 1 through stage 4 chronic kidney disease, or unspecified chronic kidney disease: Secondary | ICD-10-CM | POA: Diagnosis not present

## 2017-11-14 DIAGNOSIS — N183 Chronic kidney disease, stage 3 (moderate): Secondary | ICD-10-CM | POA: Diagnosis not present

## 2017-11-14 DIAGNOSIS — I5042 Chronic combined systolic (congestive) and diastolic (congestive) heart failure: Secondary | ICD-10-CM | POA: Diagnosis not present

## 2017-11-14 DIAGNOSIS — I2510 Atherosclerotic heart disease of native coronary artery without angina pectoris: Secondary | ICD-10-CM | POA: Diagnosis not present

## 2017-11-14 DIAGNOSIS — Z433 Encounter for attention to colostomy: Secondary | ICD-10-CM | POA: Diagnosis not present

## 2017-11-14 DIAGNOSIS — Z48815 Encounter for surgical aftercare following surgery on the digestive system: Secondary | ICD-10-CM | POA: Diagnosis not present

## 2017-11-15 ENCOUNTER — Encounter: Payer: Self-pay | Admitting: Nurse Practitioner

## 2017-11-15 ENCOUNTER — Ambulatory Visit (INDEPENDENT_AMBULATORY_CARE_PROVIDER_SITE_OTHER): Payer: Medicare Other | Admitting: Nurse Practitioner

## 2017-11-15 VITALS — BP 106/70 | HR 82 | Ht 71.0 in | Wt 339.0 lb

## 2017-11-15 DIAGNOSIS — I471 Supraventricular tachycardia: Secondary | ICD-10-CM | POA: Diagnosis not present

## 2017-11-15 DIAGNOSIS — Z48815 Encounter for surgical aftercare following surgery on the digestive system: Secondary | ICD-10-CM | POA: Diagnosis not present

## 2017-11-15 DIAGNOSIS — E7800 Pure hypercholesterolemia, unspecified: Secondary | ICD-10-CM

## 2017-11-15 DIAGNOSIS — Z951 Presence of aortocoronary bypass graft: Secondary | ICD-10-CM | POA: Diagnosis not present

## 2017-11-15 DIAGNOSIS — Z960 Presence of urogenital implants: Secondary | ICD-10-CM | POA: Diagnosis not present

## 2017-11-15 DIAGNOSIS — Z87440 Personal history of urinary (tract) infections: Secondary | ICD-10-CM | POA: Diagnosis not present

## 2017-11-15 DIAGNOSIS — I259 Chronic ischemic heart disease, unspecified: Secondary | ICD-10-CM | POA: Diagnosis not present

## 2017-11-15 DIAGNOSIS — Z792 Long term (current) use of antibiotics: Secondary | ICD-10-CM | POA: Diagnosis not present

## 2017-11-15 DIAGNOSIS — I130 Hypertensive heart and chronic kidney disease with heart failure and stage 1 through stage 4 chronic kidney disease, or unspecified chronic kidney disease: Secondary | ICD-10-CM | POA: Diagnosis not present

## 2017-11-15 DIAGNOSIS — Z85038 Personal history of other malignant neoplasm of large intestine: Secondary | ICD-10-CM | POA: Diagnosis not present

## 2017-11-15 DIAGNOSIS — J449 Chronic obstructive pulmonary disease, unspecified: Secondary | ICD-10-CM | POA: Diagnosis not present

## 2017-11-15 DIAGNOSIS — E669 Obesity, unspecified: Secondary | ICD-10-CM | POA: Diagnosis not present

## 2017-11-15 DIAGNOSIS — I4891 Unspecified atrial fibrillation: Secondary | ICD-10-CM | POA: Diagnosis not present

## 2017-11-15 DIAGNOSIS — I5022 Chronic systolic (congestive) heart failure: Secondary | ICD-10-CM

## 2017-11-15 DIAGNOSIS — I255 Ischemic cardiomyopathy: Secondary | ICD-10-CM | POA: Diagnosis not present

## 2017-11-15 DIAGNOSIS — Z4801 Encounter for change or removal of surgical wound dressing: Secondary | ICD-10-CM | POA: Diagnosis not present

## 2017-11-15 DIAGNOSIS — I5042 Chronic combined systolic (congestive) and diastolic (congestive) heart failure: Secondary | ICD-10-CM | POA: Diagnosis not present

## 2017-11-15 DIAGNOSIS — Z7902 Long term (current) use of antithrombotics/antiplatelets: Secondary | ICD-10-CM | POA: Diagnosis not present

## 2017-11-15 DIAGNOSIS — I739 Peripheral vascular disease, unspecified: Secondary | ICD-10-CM | POA: Diagnosis not present

## 2017-11-15 DIAGNOSIS — Z932 Ileostomy status: Secondary | ICD-10-CM | POA: Diagnosis not present

## 2017-11-15 DIAGNOSIS — Z433 Encounter for attention to colostomy: Secondary | ICD-10-CM | POA: Diagnosis not present

## 2017-11-15 DIAGNOSIS — Z6841 Body Mass Index (BMI) 40.0 and over, adult: Secondary | ICD-10-CM | POA: Diagnosis not present

## 2017-11-15 DIAGNOSIS — I2510 Atherosclerotic heart disease of native coronary artery without angina pectoris: Secondary | ICD-10-CM | POA: Diagnosis not present

## 2017-11-15 DIAGNOSIS — N183 Chronic kidney disease, stage 3 (moderate): Secondary | ICD-10-CM | POA: Diagnosis not present

## 2017-11-15 MED ORDER — TAMSULOSIN HCL 0.4 MG PO CAPS: 0.4000 mg | ORAL_CAPSULE | Freq: Every day | ORAL | 3 refills | Status: DC

## 2017-11-15 NOTE — Progress Notes (Signed)
CARDIOLOGY OFFICE NOTE  Date:  11/15/2017    Larry [name] Date of Birth: 1951-02-13 Medical Record #[id number]  PCP:  Larry Landsman, MD  Cardiologist:  [place]    Chief Complaint  Patient presents with  . Coronary Artery Disease  . Congestive Heart Failure  . Cardiomyopathy    Follow up visit - seen for Dr. Rayann Heman    History of Present Illness:  Larry [name] is a 67 y.o. male who presents today for a 3 month check. Seen for Dr. Rayann Heman.  He has an ischemic CM with severe diffuse 3VD and prior stents to the RCA in 3128, systolic and diastolic dysfunction, HTN, HLD and obesity. Has had a history of long standing atrial tach that has been treated with beta blockerand CCB therapy. Was hospitalized back in April of 2013 with a HF exacerbation - had stopped his medicines. Last EF of 40 - to 11% with diastolic dysfunction as well noted in January of 2015.   Seen by me back in October of 2014 - Was out of his medicines again. Had lost weight and was anemic - was sending to GI and ended up having colon cancer.   He was admitted (02/02/2013 - 03/01/2013) to [facility] with subacute history of progressive weakness, constipation and difficulty eating and drinking. He presented with acute renal failure (Creatinine 7.83) and severe anemia (Hbg of 7.3) as well as obstruction. Colonoscopy doing this admission with biopsy revealed adenocarcinoma and patient underwent exploratory lap with partial colectomy with colostomy on 12/23 by Dr. Hulen Skains. Recovery was complicated by wound dehissance, requiring a return to the OR 12/29 with abdominal wound closure with VAC placment. After the procedure, the patient developed atrial tachycardia, so a dilt drip was started (12/29-1/2). On 1/4, the patient had a cardiac arrest, requiring defibrillation, and was subsequently transferred to the ICU. QT was found to be >700, with mild hypokalemia and hypomagnesemia implicated as the  cause. Lidocaine drip was started. Electrolyte abnormality treated and 2D-echo revealed an EF of 40-45% with periapical akinesis and biatrial enlargement. He experienced recurrent torsades on 01/05 and shocked in NSR. By 1/6, QTc had decreased to 499, lidocaine drip was d/c'ed, and patient was transferred out of ICU to stepdown. Then admitted to physical medicine and rehabilitation on 01/09 due to severe deconditioning. He was continued on protein supplement to promote wound healing. His abdominal wound was treated with VAC through 03/05/13. His heart rate was controlled without recurrent arrythmia and was maintained on potassium and magnesium supplements. He was discharged to home on 03/07/2013. He was last evaluated by Dr. Hulen Skains on 02/03 with review of his pathology demonstrating invasive adenocarcinoma of the transverse colon with penetration into the pericolonic fat. He had 0/18 lymph nodes negative for metastatic disease. He was pathologic stage [id number].   Saw Dr. Gwenlyn Found in early July of 2015 for a foot ulcer - basically normal ABI'snoted at that time.  He was readmitted to the hospital in August of 2015 - triggered by too much salt. Presented with respiratory distress - treated with Bipap and quickly stabilized. Was diuresed about 6 liters as well. Started on low dose ACE. Sleep study recommended.  I have seen back here several times since that hospitalizationand have basically followed him since-he got referred for a sleep study and is on CPAP -in March of 2016 - wanting to get his colostomy reversed and needing pre op clearance. Had a Myoview to risk stratify - this  was high risk and I referred him on for cardiac cath with Dr. Burt Knack. Underwent PCI to the LAD with DES x 2. Committed to DAPT for one year ideally. He had to wait on colostomy reversal due to being on DAPT.  Seen at the cancer center back in [facility] 2017-HR elevated - asked to follow up here - I increased his Coreg  further. He ended up going in to the hospital for aweekend - still tachycardic - switched over to daily dosing of Verapamil and changed Coreg to Lopressor - looks like his ACE was stopped - pulmonary wanted him on ARB which was to be considered as an outpatient. Seen by pulmonary for a cough which led to a CT scan. HR in the 130's the whole time while in the ER. I then saw him back like 3 days later - he was feeling fine. Not short of breath. No more cough.   InNovember of 2017 - was having his tests done for planned reversal of his colostomy. Colostomy reversal occurred in February - this did not go well - had an anastomotic leak on POD 7 - ended up with an ileostomy and required a prolongedstay at [facility] respiratory failure. He was seen by cardiology for intermittentatrialtachycardia.  I saw him back in July of 2018 after he had been home from [facility] for about a month - he was out of several of his medicines. HR was up. No awareness of his heart beating fast.  I saw him earlier this year - then referred back to Dr. Rayann Heman for his atrial tach just to make sure we were all on the right track with his current plan of care. Has chronic massive lower extremity edema. Options felt to be limited. CCB therapy continued.   He was admitted in May of 2019 - had sepsis from a UTI. His medicines were cut back again. We have had several phone calls from Home health about his elevated HR after discharge - got him back on his usual dose of CCB therapy and beta blocker.   He underwent GU procedures in June - despite being at high risk for any procedures going forward from our standpoint.  He did ok. I then saw him at the later part of June - he was doing ok - still no sensation of any palpitations or his heart rate. Remains pretty sedentary.   Comes in today. Here alone. Looks better today. Weight is down about 11 pounds since mid June. Swelling has improved considerably. No palpitations - has never  had. HR better. He is trying to elevate his legs more and walk a little more. He has ran out of his Flomax - could not get refilled. No chest pain. Not dizzy. Not lightheaded. Breathing is pretty good. Overall, he has no real concerns.   Past Medical History:  Diagnosis Date  . Arthritis   . Atrial tachycardia ([place])    managed on beta blocker therapy  . Childhood asthma    "went away after I was 16"  . Chronic combined systolic and diastolic CHF, NYHA class 3 (HCC)    has diastolic heart failure grade 1; EF is 45 to 50% per echo 05/2011; EF 41% by Myoview 2016  . CKD (chronic kidney disease) stage 3, GFR 30-59 ml/min (HCC)   . Colon cancer ([place]) 2015   MSI high; IHC loss of MLH1 and PMS2; BRAF negative; Negative methylation  . COPD (chronic obstructive pulmonary disease) ([place])   . Coronary artery disease   .  Family history of ovarian cancer   . Hypercholesteremia   . Hypertension   . Noncompliance   . NSVT (nonsustained ventricular tachycardia) (HCC)    beta blocker restarted  . Obesity   . OSA on CPAP    used nightly, pt does not know settings  . Peripheral arterial disease (HCC)    4.2 cm thoracic aortic aneurysm per chest ct 11-14-15 epic  . S/P colostomy ([place])    2014  . Thrombocytopenia ([place])   . Torsades de pointes ([place])    X 2 episodes during hospital visit 12'14"electrolyte imbalance"- "Shocked"    Past Surgical History:  Procedure Laterality Date  . COLON SURGERY    . COLONOSCOPY N/A 02/08/2013   Procedure: COLONOSCOPY;  Surgeon: [name], MD;  Location: [place];  Service: Endoscopy;  Laterality: N/A;  . COLONOSCOPY WITH PROPOFOL N/A 05/09/2014   Procedure: COLONOSCOPY WITH PROPOFOL;  Surgeon: [name], MD;  Location: [facility];  Service: Endoscopy;  Laterality: N/A;  . COLONOSCOPY WITH PROPOFOL N/A 01/08/2016   Procedure: COLONOSCOPY WITH PROPOFOL;  Surgeon: [name], MD;  [facility];  Service: Endoscopy;  Laterality: N/A;  .  COLOSTOMY N/A 04/19/2016   Procedure: COLOSTOMY;  Surgeon: Judeth Horn, MD;  Location: [place];  Service: General;  Laterality: N/A;  . COLOSTOMY REVERSAL  04/12/2016  . COLOSTOMY REVERSAL N/A 04/12/2016   Procedure: COLOSTOMY REVERSAL;  Surgeon: Judeth Horn, MD;  Location: [place];  Service: General;  Laterality: N/A;  . CORONARY ANGIOPLASTY WITH STENT PLACEMENT  11/12/2008; 06/11/2014   stent x 2 to RCA; stent x 2 to LAD  . CORONARY ANGIOPLASTY WITH STENT PLACEMENT  06/11/2014   m-LAD 3.5 x 16 mm Synergy DES, d-LAD  2.25 x 16 mm Synergy DES  . CYSTOSCOPY W/ URETERAL STENT PLACEMENT Left 06/30/2017   Procedure: CYSTOSCOPY WITH RETROGRADE PYELOGRAM/URETERAL STENT PLACEMENT;  Surgeon: Lucas Mallow, MD;  Location: [place];  Service: Urology;  Laterality: Left;  . CYSTOSCOPY WITH LITHOLAPAXY N/A 08/02/2017   Procedure: CYSTOSCOPY WITH BLADDER STONE EXTRACTION;  Surgeon: Lucas Mallow, MD;  [facility];  Service: Urology;  Laterality: N/A;  . CYSTOSCOPY WITH RETROGRADE PYELOGRAM, URETEROSCOPY AND STENT PLACEMENT Left 08/02/2017   Procedure: CYSTOSCOPY WITH LEFT RETROGRADE PYELOGRAM, URETEROSCOPY AND STENT EXCHANGE;  Surgeon: Lucas Mallow, MD;  [facility];  Service: Urology;  Laterality: Left;  . ESOPHAGOGASTRODUODENOSCOPY N/A 02/08/2013   Procedure: ESOPHAGOGASTRODUODENOSCOPY (EGD);  Surgeon: [name], MD;  Location: [facility];  Service: Endoscopy;  Laterality: N/A;  . FLEXIBLE SIGMOIDOSCOPY N/A 02/19/2016   Procedure: FLEXIBLE SIGMOIDOSCOPY;  Surgeon: [name], MD;  [facility];  Service: Endoscopy;  Laterality: N/A;  . HOLMIUM LASER APPLICATION Left 02/23/129   Procedure: HOLMIUM LASER APPLICATION;  Surgeon: Lucas Mallow, MD;  [facility];  Service: Urology;  Laterality: Left;  . LAPAROTOMY N/A 02/12/2013   Procedure: EXPLORATORY LAPAROTOMY PARTIAL COLECTOMY WITH COLOSTOMY;  Surgeon: [name], MD;  Location: [place];  Service: General;   Laterality: N/A;  . LAPAROTOMY N/A 02/18/2013   Procedure: EXPLORATORY LAPAROTOMY/Closure of Wound;  Surgeon: [name], MD;  Location: [place];  Service: General;  Laterality: N/A;  . LAPAROTOMY N/A 04/19/2016   Procedure: EXPLORATORY LAPAROTOMY, REPAIR OF ANASTAMOTIC LEAK;  Surgeon: Judeth Horn, MD;  Location: [place];  Service: General;  Laterality: N/A;  . LEFT HEART CATHETERIZATION WITH CORONARY ANGIOGRAM N/A 06/11/2014   Procedure: LEFT HEART CATHETERIZATION WITH CORONARY ANGIOGRAM;  Surgeon: [name], MD; CFX calcified, 30-40 percent, RCA calcified, 40/50/40%, PDA diffuse disease, LAD 40/75/90% s/p DES 2      Medications: Current Meds  Medication Sig  . aspirin EC 81 MG tablet Take 1 tablet (81 mg total) by mouth daily.  [name] Kitchen atorvastatin (LIPITOR) 40 MG tablet Take 40 mg by mouth daily.  . clopidogrel (PLAVIX) 75 MG tablet Take 75 mg by mouth daily.  [name] Kitchen diltiazem (CARDIZEM) 90 MG tablet Take 1 tablet (90 mg total) by mouth 3 (three) times daily.  . ferrous sulfate 325 (65 FE) MG tablet Take 1 tablet (325 mg total) by mouth daily with breakfast.  . furosemide (LASIX) 40 MG tablet Take 1.5 tablets (60 mg total) by mouth daily.  [name] Kitchen levalbuterol (XOPENEX HFA) 45 MCG/ACT inhaler INHALE 2 PUFFS INTO THE LUNGS EVERY 4 (FOUR) HOURS AS NEEDED FOR WHEEZING.  . metoprolol tartrate (LOPRESSOR) 25 MG tablet TAKE 1.5 TABLETS (37.5 MG TOTAL) BY MOUTH 2 (TWO) TIMES DAILY.  . Multiple Vitamins-Minerals (CENTRUM SILVER 50+MEN) TABS Take 1 tablet by mouth daily with lunch.  . nitroGLYCERIN (NITROSTAT) 0.4 MG SL tablet Place 0.4 mg under the tongue every 5 (five) minutes as needed for chest pain.  . simethicone (GAS-X EXTRA STRENGTH) 125 MG chewable tablet Chew 125 mg by mouth 3 (three) times daily.  [name] Kitchen spironolactone (ALDACTONE) 25 MG tablet Take 0.5 tablets (12.5 mg total) by mouth daily.  . tamsulosin (FLOMAX) 0.4 MG CAPS capsule Take 1 capsule (0.4 mg total) by mouth daily.  . [DISCONTINUED]  furosemide (LASIX) 40 MG tablet Take 1.5 tablets (60 mg total) by mouth daily with lunch.  . [DISCONTINUED] tamsulosin (FLOMAX) 0.4 MG CAPS capsule Take 1 capsule (0.4 mg total) by mouth daily.     Allergies: No Known Allergies  Social History: The patient  reports that he quit smoking about 9 years ago. His smoking use included cigarettes. He has a 38.00 pack-year smoking history. He has never used smokeless tobacco. He reports that he drinks alcohol. He reports that he does not use drugs.   Family History: The patient's family history includes Dementia in his mother; Diabetes in his father; Heart disease in his father; Hypertension in his father and mother; Leukemia in his maternal uncle; Liver cancer in his maternal grandmother; Parkinsonism in his mother; Prostate cancer in his maternal uncle.   Review of Systems: Please see the history of present illness.   Otherwise, the review of systems is positive for none.   All other systems are reviewed and negative.   Physical Exam: VS:  BP 106/70 (BP Location: Left Arm, Patient Position: Sitting, Cuff Size: Large)   Pulse 82   Ht _0  (1.803 m)   Wt (!) 339 lb (153.8 kg)   SpO2 95% Comment: at rest  BMI 47.28 kg/m  .  BMI Body mass index is 47.28 kg/m.  Wt Readings from Last 3 Encounters:  11/15/17 (!) 339 lb (153.8 kg)  08/14/17 (!) 345 lb 6.4 oz (156.7 kg)  08/02/17 (!) 350 lb (158.8 kg)    General: Pleasant. He is of large stature. He is alert and in no acute distress. His weight is down 11 pounds since mid June.    HEENT: Normal.  Neck: Supple, no JVD, carotid bruits, or masses noted.  Cardiac: Fairly regular rhythm. Rate is ok today. His swelling - while still present - is greatly improved.   Respiratory:  Lungs are clear to auscultation bilaterally with normal work of breathing.  GI:  Soft and nontender.  MS: No deformity or atrophy. Gait and ROM intact.  Skin: Warm and dry. Color is normal.  Neuro:  Strength and  sensation are intact and no gross focal deficits noted.  Psych: Alert, appropriate and with normal affect.   LABORATORY DATA:  EKG:  EKG is not ordered today.  Lab Results  Component Value Date   WBC 6.1 07/31/2017   HGB 11.1 (L) 07/31/2017   HCT 35.5 (L) 07/31/2017   PLT 135 (L) 07/31/2017   GLUCOSE 86 08/21/2017   CHOL 148 05/03/2017   TRIG 171 (H) 05/03/2017   HDL 54 05/03/2017   LDLCALC 60 05/03/2017   ALT 20 07/01/2017   AST 19 07/01/2017   NA 144 08/21/2017   K 3.9 08/21/2017   CL 106 08/21/2017   CREATININE 1.78 (H) 08/21/2017   BUN 17 08/21/2017   CO2 23 08/21/2017   TSH 1.080 05/14/2016   INR 1.31 06/30/2017   HGBA1C 5.9 (H) 05/14/2016     BNP (last 3 results) No results for input(s): BNP in the last 8760 hours.  ProBNP (last 3 results) Recent Labs    02/22/17 0926 05/03/17 1021  PROBNP 87 126     Other Studies Reviewed Today:  EchoStudy Conclusions2/2018  - Left ventricle: The cavity size was normal. Wall thickness was increased in a pattern of moderate LVH. Systolic function was normal. The estimated ejection fraction was in the range of 55% to 60%. Wall motion was normal; there were no regional wall motion abnormalities. The study is not technically sufficient to allow evaluation of LV diastolic function. - Aortic root: The aortic root was mildly dilated. - Mitral valve: Calcified annulus.  Impressions:  - Technically difficult; definity used; normal LV function; moderate LVH.   MyoviewStudy Highlights12/2017   Nuclear stress EF: 58%.  There was no ST segment deviation noted during stress.  There is a large defect of severe severity present in the basal inferior, mid inferior, apical septal, apical inferior and apex location. The defect is non-reversible. No ischemia noted. Tthis is consistent with diaphragmatic attenuation artifact vs. Scar.  This is a low risk study.  The left ventricular ejection fraction  is normal (55-65%).      Procedure: Left Heart Cath, Selective Coronary Angiography, PTCA and stenting of the mid and distal LAD April 2016.  Coronary angiography: Left mainstem: The left mainstem is patent. The vessel divides into the LAD and left circumflex. There is no significant stenosis, but there is mild irregularity.  Left anterior descending (LAD): The LAD is moderately calcified. The vessel is severely diseased. The proximal LAD is patent with 30-40% stenosis after the second septal perforator. There is diffuse calcification. The diagonal branches are small. The mid LAD has an eccentric 75% stenosis at the origin of the second diagonal branch. Beyond that area the mid and distal LAD are diffusely diseased. There is another severe stenosis in the apical LAD of 90%.  Left circumflex (LCx): The left circumflex is calcified. The vessel is patent with mild irregularity. There are no high-grade stenoses identified. There are scattered 30-40% stenoses throughout the proximal and mid circumflex as well as the first OM branch.  Right coronary artery (RCA): The RCA is dominant. The vessel is heavily calcified. The stented segments in the mid and distal RCA are patent. The proximal vessel has 30-40% stenosis. The mid vessel has 30-40% stenosis. The distal vessel is patent with 50-60% stenosis involving the origin of the PDA. The PDA is diffusely  diseased.  Left ventriculography: Deferred because of chronic kidney disease. By nuclear scan the LVEF is 41%.  PCI Note: Following the diagnostic procedure, the decision was made to proceed with PCI of the mid and apical LAD. The right coronary artery had patent stents in the left circumflex had nonobstructive disease. The LAD is a diffusely diseased vessel and the mid and distal vessel would be a poor surgical targets. I felt that PCI was the best option in this patient with a high risk nuclear scan demonstrating anteroapical and  anterolateral ischemia. He will require colostomy reversal, so I planned on treating him with Synergy drug-eluting stents which had a biodegradable polymer and potentially can allow for earlier interruption of dual antiplatelet therapy. The patient was loaded with Plavix 600 mg. Weight-based bivalirudin was given for anticoagulation. Once a therapeutic ACT was achieved, a 6 [place] XB LAD 3.5 cm guide catheter was inserted. A cougar coronary guidewire was used to cross the lesion in the apical LAD. The lesion was predilated with a 2.5 x 12 mm balloon. The same balloon was used to dilate the mid lesion. The apical lesion was then stented with a 2.25 x 16 mm Synergy DES. The stent was postdilated with a 2.5 mm noncompliant balloon. The mid lesion was then stented with a 3.5 x 16 mm Synergy DES. That stent was postdilated with a 3.75 mm noncompliant balloon. Following PCI, there was 0% residual stenosis and TIMI-3 flow. Final angiography confirmed an excellent result. The patient tolerated the procedure well. There were no immediate procedural complications. A TR band was used for radial hemostasis. The patient was transferred to the post catheterization recovery area for further monitoring.  PCI Data: Lesion 1: Vessel - LAD/Segment - distal/apical Percent Stenosis (pre) 90 TIMI-flow 3 Stent 2.25 x 16 mm Synergy DES Percent Stenosis (post) 0 TIMI-flow (post) 3  Lesion 2: Vessel - LAD/Segment - mid Percent Stenosis (pre) 75 TIMI-flow 3 Stent 3.5 x 16 mm Synergy DES Percent Stenosis (post) 0 TIMI-flow (post) 3  Estimated Blood Loss: Minimal  Final Conclusions:  1. Two-vessel coronary artery disease with continued patency of the stented segments in the right coronary artery and severe stenoses of the mid and apical LAD 2. Mild diffuse nonobstructive left circumflex stenosis 3. Successful PCI of the LAD using 2 drug-eluting stents   Recommendations:  Dual antiplatelet therapy for at  least 6 months. Would be reasonable to consider interruption of aspirin and Plavix at 6 months for colostomy reversal.  [name] MD, [facility] 06/11/2014, 10:06 AM   Assessment / Plan:  1. Intermitted atrial tach - this has been chronic for many years - he has never been symptomatic - he is managed medically. We have had Dr. Rayann Heman evaluate - he is not a good candidate for any EP procedures or AAD therapies - he is managed conservatively.   2.  CAD with prior PCI to the RCA - S/P cath with PCI to the LAD back in April of 2016 following high risk Myoview - has had DES x 2 to the [place] 2016-he remains on chronic DAPT therapy - his last Myoview from 01/2016 was low risk.We continue to manage him medically. CV risk factor modification is challenging at best - fortunately, he is doing well.   3.History of combined systolic and diastolic HF -lastecho from 2018with normal EF but also a very technically difficult study. He looks good today - weight is down, swelling has improved.  Back on his prior regimen. No changes  made today.   4. Colon cancer - followed by oncology -has had attempt at colostomy reversal now with ileostomy in place.This will be long term.Not discussed today  5. HTN - BP looks good - no changes made today. No symptoms.   6. CKD    7. OSA - now on CPAP and followed by Dr. Annamaria Boots- using sporadically.  8. HLD - on statin therapy - will recheck lab on return.   9. Recent GU procedures - he has been released from GU - I refilled his Flomax today to avoid urinary retention with potential for recurrent UTI, then possible recurrent sepsis, etc.  Current medicines are reviewed with the patient today.  The patient does not have concerns regarding medicines other than what has been noted above.  The following changes have been made:  See above.  Labs/ tests ordered today include:   No orders of the defined types were placed in this  encounter.    Disposition:   FU with me in 4 months. Will check lab on return.    Patient is agreeable to this plan and will call if any problems develop in the interim.   SignedTruitt Merle, NP  11/15/2017 10:52 AM  [place] [address] [place], [place]  [address] Phone: [phone] Fax: [phone]

## 2017-11-15 NOTE — Patient Instructions (Addendum)
We will be checking the following labs today - NONE   Medication Instructions:    Continue with your current medicines.   I refilled your Flomax today    Testing/Procedures To Be Arranged:  N/A  Follow-Up:   See me in about 4 months - we will do lab when you come back.     Other Special Instructions:   Keep up the good work!!    If you need a refill on your cardiac medications before your next appointment, please call your pharmacy.   Call the [place] office at [phone] if you have any questions, problems or concerns.

## 2017-11-17 DIAGNOSIS — I5042 Chronic combined systolic (congestive) and diastolic (congestive) heart failure: Secondary | ICD-10-CM | POA: Diagnosis not present

## 2017-11-17 DIAGNOSIS — Z433 Encounter for attention to colostomy: Secondary | ICD-10-CM | POA: Diagnosis not present

## 2017-11-17 DIAGNOSIS — Z48815 Encounter for surgical aftercare following surgery on the digestive system: Secondary | ICD-10-CM | POA: Diagnosis not present

## 2017-11-17 DIAGNOSIS — Z4801 Encounter for change or removal of surgical wound dressing: Secondary | ICD-10-CM | POA: Diagnosis not present

## 2017-11-17 DIAGNOSIS — I2510 Atherosclerotic heart disease of native coronary artery without angina pectoris: Secondary | ICD-10-CM | POA: Diagnosis not present

## 2017-11-17 DIAGNOSIS — I130 Hypertensive heart and chronic kidney disease with heart failure and stage 1 through stage 4 chronic kidney disease, or unspecified chronic kidney disease: Secondary | ICD-10-CM | POA: Diagnosis not present

## 2017-11-21 DIAGNOSIS — I2510 Atherosclerotic heart disease of native coronary artery without angina pectoris: Secondary | ICD-10-CM | POA: Diagnosis not present

## 2017-11-21 DIAGNOSIS — Z4801 Encounter for change or removal of surgical wound dressing: Secondary | ICD-10-CM | POA: Diagnosis not present

## 2017-11-21 DIAGNOSIS — Z433 Encounter for attention to colostomy: Secondary | ICD-10-CM | POA: Diagnosis not present

## 2017-11-21 DIAGNOSIS — I130 Hypertensive heart and chronic kidney disease with heart failure and stage 1 through stage 4 chronic kidney disease, or unspecified chronic kidney disease: Secondary | ICD-10-CM | POA: Diagnosis not present

## 2017-11-21 DIAGNOSIS — I5042 Chronic combined systolic (congestive) and diastolic (congestive) heart failure: Secondary | ICD-10-CM | POA: Diagnosis not present

## 2017-11-21 DIAGNOSIS — Z48815 Encounter for surgical aftercare following surgery on the digestive system: Secondary | ICD-10-CM | POA: Diagnosis not present

## 2017-11-24 DIAGNOSIS — Z4801 Encounter for change or removal of surgical wound dressing: Secondary | ICD-10-CM | POA: Diagnosis not present

## 2017-11-24 DIAGNOSIS — Z433 Encounter for attention to colostomy: Secondary | ICD-10-CM | POA: Diagnosis not present

## 2017-11-24 DIAGNOSIS — I2510 Atherosclerotic heart disease of native coronary artery without angina pectoris: Secondary | ICD-10-CM | POA: Diagnosis not present

## 2017-11-24 DIAGNOSIS — I5042 Chronic combined systolic (congestive) and diastolic (congestive) heart failure: Secondary | ICD-10-CM | POA: Diagnosis not present

## 2017-11-24 DIAGNOSIS — I130 Hypertensive heart and chronic kidney disease with heart failure and stage 1 through stage 4 chronic kidney disease, or unspecified chronic kidney disease: Secondary | ICD-10-CM | POA: Diagnosis not present

## 2017-11-24 DIAGNOSIS — Z48815 Encounter for surgical aftercare following surgery on the digestive system: Secondary | ICD-10-CM | POA: Diagnosis not present

## 2017-11-28 DIAGNOSIS — Z433 Encounter for attention to colostomy: Secondary | ICD-10-CM | POA: Diagnosis not present

## 2017-11-28 DIAGNOSIS — I5042 Chronic combined systolic (congestive) and diastolic (congestive) heart failure: Secondary | ICD-10-CM | POA: Diagnosis not present

## 2017-11-28 DIAGNOSIS — I2510 Atherosclerotic heart disease of native coronary artery without angina pectoris: Secondary | ICD-10-CM | POA: Diagnosis not present

## 2017-11-28 DIAGNOSIS — Z4801 Encounter for change or removal of surgical wound dressing: Secondary | ICD-10-CM | POA: Diagnosis not present

## 2017-11-28 DIAGNOSIS — Z48815 Encounter for surgical aftercare following surgery on the digestive system: Secondary | ICD-10-CM | POA: Diagnosis not present

## 2017-11-28 DIAGNOSIS — I130 Hypertensive heart and chronic kidney disease with heart failure and stage 1 through stage 4 chronic kidney disease, or unspecified chronic kidney disease: Secondary | ICD-10-CM | POA: Diagnosis not present

## 2017-12-01 DIAGNOSIS — I5042 Chronic combined systolic (congestive) and diastolic (congestive) heart failure: Secondary | ICD-10-CM | POA: Diagnosis not present

## 2017-12-01 DIAGNOSIS — Z48815 Encounter for surgical aftercare following surgery on the digestive system: Secondary | ICD-10-CM | POA: Diagnosis not present

## 2017-12-01 DIAGNOSIS — Z433 Encounter for attention to colostomy: Secondary | ICD-10-CM | POA: Diagnosis not present

## 2017-12-01 DIAGNOSIS — I130 Hypertensive heart and chronic kidney disease with heart failure and stage 1 through stage 4 chronic kidney disease, or unspecified chronic kidney disease: Secondary | ICD-10-CM | POA: Diagnosis not present

## 2017-12-01 DIAGNOSIS — Z4801 Encounter for change or removal of surgical wound dressing: Secondary | ICD-10-CM | POA: Diagnosis not present

## 2017-12-01 DIAGNOSIS — I2510 Atherosclerotic heart disease of native coronary artery without angina pectoris: Secondary | ICD-10-CM | POA: Diagnosis not present

## 2017-12-05 DIAGNOSIS — Z433 Encounter for attention to colostomy: Secondary | ICD-10-CM | POA: Diagnosis not present

## 2017-12-05 DIAGNOSIS — I2510 Atherosclerotic heart disease of native coronary artery without angina pectoris: Secondary | ICD-10-CM | POA: Diagnosis not present

## 2017-12-05 DIAGNOSIS — I5042 Chronic combined systolic (congestive) and diastolic (congestive) heart failure: Secondary | ICD-10-CM | POA: Diagnosis not present

## 2017-12-05 DIAGNOSIS — Z48815 Encounter for surgical aftercare following surgery on the digestive system: Secondary | ICD-10-CM | POA: Diagnosis not present

## 2017-12-05 DIAGNOSIS — I130 Hypertensive heart and chronic kidney disease with heart failure and stage 1 through stage 4 chronic kidney disease, or unspecified chronic kidney disease: Secondary | ICD-10-CM | POA: Diagnosis not present

## 2017-12-05 DIAGNOSIS — Z4801 Encounter for change or removal of surgical wound dressing: Secondary | ICD-10-CM | POA: Diagnosis not present

## 2017-12-08 DIAGNOSIS — I130 Hypertensive heart and chronic kidney disease with heart failure and stage 1 through stage 4 chronic kidney disease, or unspecified chronic kidney disease: Secondary | ICD-10-CM | POA: Diagnosis not present

## 2017-12-08 DIAGNOSIS — Z48815 Encounter for surgical aftercare following surgery on the digestive system: Secondary | ICD-10-CM | POA: Diagnosis not present

## 2017-12-08 DIAGNOSIS — I2510 Atherosclerotic heart disease of native coronary artery without angina pectoris: Secondary | ICD-10-CM | POA: Diagnosis not present

## 2017-12-08 DIAGNOSIS — Z4801 Encounter for change or removal of surgical wound dressing: Secondary | ICD-10-CM | POA: Diagnosis not present

## 2017-12-08 DIAGNOSIS — Z433 Encounter for attention to colostomy: Secondary | ICD-10-CM | POA: Diagnosis not present

## 2017-12-08 DIAGNOSIS — I5042 Chronic combined systolic (congestive) and diastolic (congestive) heart failure: Secondary | ICD-10-CM | POA: Diagnosis not present

## 2017-12-12 DIAGNOSIS — T8131XD Disruption of external operation (surgical) wound, not elsewhere classified, subsequent encounter: Secondary | ICD-10-CM | POA: Diagnosis not present

## 2017-12-12 DIAGNOSIS — L7682 Other postprocedural complications of skin and subcutaneous tissue: Secondary | ICD-10-CM | POA: Diagnosis not present

## 2017-12-13 DIAGNOSIS — I5042 Chronic combined systolic (congestive) and diastolic (congestive) heart failure: Secondary | ICD-10-CM | POA: Diagnosis not present

## 2017-12-13 DIAGNOSIS — I2510 Atherosclerotic heart disease of native coronary artery without angina pectoris: Secondary | ICD-10-CM | POA: Diagnosis not present

## 2017-12-13 DIAGNOSIS — I130 Hypertensive heart and chronic kidney disease with heart failure and stage 1 through stage 4 chronic kidney disease, or unspecified chronic kidney disease: Secondary | ICD-10-CM | POA: Diagnosis not present

## 2017-12-13 DIAGNOSIS — Z4801 Encounter for change or removal of surgical wound dressing: Secondary | ICD-10-CM | POA: Diagnosis not present

## 2017-12-13 DIAGNOSIS — Z48815 Encounter for surgical aftercare following surgery on the digestive system: Secondary | ICD-10-CM | POA: Diagnosis not present

## 2017-12-13 DIAGNOSIS — Z433 Encounter for attention to colostomy: Secondary | ICD-10-CM | POA: Diagnosis not present

## 2017-12-15 DIAGNOSIS — I130 Hypertensive heart and chronic kidney disease with heart failure and stage 1 through stage 4 chronic kidney disease, or unspecified chronic kidney disease: Secondary | ICD-10-CM | POA: Diagnosis not present

## 2017-12-15 DIAGNOSIS — Z48815 Encounter for surgical aftercare following surgery on the digestive system: Secondary | ICD-10-CM | POA: Diagnosis not present

## 2017-12-15 DIAGNOSIS — Z4801 Encounter for change or removal of surgical wound dressing: Secondary | ICD-10-CM | POA: Diagnosis not present

## 2017-12-15 DIAGNOSIS — Z433 Encounter for attention to colostomy: Secondary | ICD-10-CM | POA: Diagnosis not present

## 2017-12-15 DIAGNOSIS — I2510 Atherosclerotic heart disease of native coronary artery without angina pectoris: Secondary | ICD-10-CM | POA: Diagnosis not present

## 2017-12-15 DIAGNOSIS — I5042 Chronic combined systolic (congestive) and diastolic (congestive) heart failure: Secondary | ICD-10-CM | POA: Diagnosis not present

## 2017-12-19 ENCOUNTER — Other Ambulatory Visit: Payer: Self-pay | Admitting: Nurse Practitioner

## 2017-12-19 DIAGNOSIS — Z433 Encounter for attention to colostomy: Secondary | ICD-10-CM | POA: Diagnosis not present

## 2017-12-19 DIAGNOSIS — Z4801 Encounter for change or removal of surgical wound dressing: Secondary | ICD-10-CM | POA: Diagnosis not present

## 2017-12-19 DIAGNOSIS — I5042 Chronic combined systolic (congestive) and diastolic (congestive) heart failure: Secondary | ICD-10-CM | POA: Diagnosis not present

## 2017-12-19 DIAGNOSIS — Z48815 Encounter for surgical aftercare following surgery on the digestive system: Secondary | ICD-10-CM | POA: Diagnosis not present

## 2017-12-19 DIAGNOSIS — I2510 Atherosclerotic heart disease of native coronary artery without angina pectoris: Secondary | ICD-10-CM | POA: Diagnosis not present

## 2017-12-19 DIAGNOSIS — I130 Hypertensive heart and chronic kidney disease with heart failure and stage 1 through stage 4 chronic kidney disease, or unspecified chronic kidney disease: Secondary | ICD-10-CM | POA: Diagnosis not present

## 2017-12-21 ENCOUNTER — Other Ambulatory Visit: Payer: Self-pay | Admitting: Nurse Practitioner

## 2017-12-22 ENCOUNTER — Other Ambulatory Visit: Payer: Self-pay | Admitting: Nurse Practitioner

## 2017-12-22 DIAGNOSIS — I5042 Chronic combined systolic (congestive) and diastolic (congestive) heart failure: Secondary | ICD-10-CM | POA: Diagnosis not present

## 2017-12-22 DIAGNOSIS — Z433 Encounter for attention to colostomy: Secondary | ICD-10-CM | POA: Diagnosis not present

## 2017-12-22 DIAGNOSIS — Z4801 Encounter for change or removal of surgical wound dressing: Secondary | ICD-10-CM | POA: Diagnosis not present

## 2017-12-22 DIAGNOSIS — I130 Hypertensive heart and chronic kidney disease with heart failure and stage 1 through stage 4 chronic kidney disease, or unspecified chronic kidney disease: Secondary | ICD-10-CM | POA: Diagnosis not present

## 2017-12-22 DIAGNOSIS — I2510 Atherosclerotic heart disease of native coronary artery without angina pectoris: Secondary | ICD-10-CM | POA: Diagnosis not present

## 2017-12-22 DIAGNOSIS — Z48815 Encounter for surgical aftercare following surgery on the digestive system: Secondary | ICD-10-CM | POA: Diagnosis not present

## 2017-12-25 DIAGNOSIS — I2510 Atherosclerotic heart disease of native coronary artery without angina pectoris: Secondary | ICD-10-CM | POA: Diagnosis not present

## 2017-12-25 DIAGNOSIS — Z433 Encounter for attention to colostomy: Secondary | ICD-10-CM | POA: Diagnosis not present

## 2017-12-25 DIAGNOSIS — Z48815 Encounter for surgical aftercare following surgery on the digestive system: Secondary | ICD-10-CM | POA: Diagnosis not present

## 2017-12-25 DIAGNOSIS — Z4801 Encounter for change or removal of surgical wound dressing: Secondary | ICD-10-CM | POA: Diagnosis not present

## 2017-12-25 DIAGNOSIS — I130 Hypertensive heart and chronic kidney disease with heart failure and stage 1 through stage 4 chronic kidney disease, or unspecified chronic kidney disease: Secondary | ICD-10-CM | POA: Diagnosis not present

## 2017-12-25 DIAGNOSIS — I5042 Chronic combined systolic (congestive) and diastolic (congestive) heart failure: Secondary | ICD-10-CM | POA: Diagnosis not present

## 2017-12-29 DIAGNOSIS — I2510 Atherosclerotic heart disease of native coronary artery without angina pectoris: Secondary | ICD-10-CM | POA: Diagnosis not present

## 2017-12-29 DIAGNOSIS — Z433 Encounter for attention to colostomy: Secondary | ICD-10-CM | POA: Diagnosis not present

## 2017-12-29 DIAGNOSIS — Z4801 Encounter for change or removal of surgical wound dressing: Secondary | ICD-10-CM | POA: Diagnosis not present

## 2017-12-29 DIAGNOSIS — I130 Hypertensive heart and chronic kidney disease with heart failure and stage 1 through stage 4 chronic kidney disease, or unspecified chronic kidney disease: Secondary | ICD-10-CM | POA: Diagnosis not present

## 2017-12-29 DIAGNOSIS — I5042 Chronic combined systolic (congestive) and diastolic (congestive) heart failure: Secondary | ICD-10-CM | POA: Diagnosis not present

## 2017-12-29 DIAGNOSIS — Z48815 Encounter for surgical aftercare following surgery on the digestive system: Secondary | ICD-10-CM | POA: Diagnosis not present

## 2018-01-01 DIAGNOSIS — Z433 Encounter for attention to colostomy: Secondary | ICD-10-CM | POA: Diagnosis not present

## 2018-01-01 DIAGNOSIS — I130 Hypertensive heart and chronic kidney disease with heart failure and stage 1 through stage 4 chronic kidney disease, or unspecified chronic kidney disease: Secondary | ICD-10-CM | POA: Diagnosis not present

## 2018-01-01 DIAGNOSIS — I5042 Chronic combined systolic (congestive) and diastolic (congestive) heart failure: Secondary | ICD-10-CM | POA: Diagnosis not present

## 2018-01-01 DIAGNOSIS — Z48815 Encounter for surgical aftercare following surgery on the digestive system: Secondary | ICD-10-CM | POA: Diagnosis not present

## 2018-01-01 DIAGNOSIS — I2510 Atherosclerotic heart disease of native coronary artery without angina pectoris: Secondary | ICD-10-CM | POA: Diagnosis not present

## 2018-01-01 DIAGNOSIS — Z4801 Encounter for change or removal of surgical wound dressing: Secondary | ICD-10-CM | POA: Diagnosis not present

## 2018-01-05 DIAGNOSIS — I130 Hypertensive heart and chronic kidney disease with heart failure and stage 1 through stage 4 chronic kidney disease, or unspecified chronic kidney disease: Secondary | ICD-10-CM | POA: Diagnosis not present

## 2018-01-05 DIAGNOSIS — I2510 Atherosclerotic heart disease of native coronary artery without angina pectoris: Secondary | ICD-10-CM | POA: Diagnosis not present

## 2018-01-05 DIAGNOSIS — Z433 Encounter for attention to colostomy: Secondary | ICD-10-CM | POA: Diagnosis not present

## 2018-01-05 DIAGNOSIS — Z48815 Encounter for surgical aftercare following surgery on the digestive system: Secondary | ICD-10-CM | POA: Diagnosis not present

## 2018-01-05 DIAGNOSIS — Z4801 Encounter for change or removal of surgical wound dressing: Secondary | ICD-10-CM | POA: Diagnosis not present

## 2018-01-05 DIAGNOSIS — I5042 Chronic combined systolic (congestive) and diastolic (congestive) heart failure: Secondary | ICD-10-CM | POA: Diagnosis not present

## 2018-01-08 DIAGNOSIS — Z4801 Encounter for change or removal of surgical wound dressing: Secondary | ICD-10-CM | POA: Diagnosis not present

## 2018-01-08 DIAGNOSIS — Z433 Encounter for attention to colostomy: Secondary | ICD-10-CM | POA: Diagnosis not present

## 2018-01-08 DIAGNOSIS — I130 Hypertensive heart and chronic kidney disease with heart failure and stage 1 through stage 4 chronic kidney disease, or unspecified chronic kidney disease: Secondary | ICD-10-CM | POA: Diagnosis not present

## 2018-01-08 DIAGNOSIS — Z48815 Encounter for surgical aftercare following surgery on the digestive system: Secondary | ICD-10-CM | POA: Diagnosis not present

## 2018-01-08 DIAGNOSIS — I2510 Atherosclerotic heart disease of native coronary artery without angina pectoris: Secondary | ICD-10-CM | POA: Diagnosis not present

## 2018-01-08 DIAGNOSIS — I5042 Chronic combined systolic (congestive) and diastolic (congestive) heart failure: Secondary | ICD-10-CM | POA: Diagnosis not present

## 2018-01-12 DIAGNOSIS — Z4801 Encounter for change or removal of surgical wound dressing: Secondary | ICD-10-CM | POA: Diagnosis not present

## 2018-01-12 DIAGNOSIS — I2510 Atherosclerotic heart disease of native coronary artery without angina pectoris: Secondary | ICD-10-CM | POA: Diagnosis not present

## 2018-01-12 DIAGNOSIS — Z48815 Encounter for surgical aftercare following surgery on the digestive system: Secondary | ICD-10-CM | POA: Diagnosis not present

## 2018-01-12 DIAGNOSIS — I5042 Chronic combined systolic (congestive) and diastolic (congestive) heart failure: Secondary | ICD-10-CM | POA: Diagnosis not present

## 2018-01-12 DIAGNOSIS — I130 Hypertensive heart and chronic kidney disease with heart failure and stage 1 through stage 4 chronic kidney disease, or unspecified chronic kidney disease: Secondary | ICD-10-CM | POA: Diagnosis not present

## 2018-01-12 DIAGNOSIS — Z433 Encounter for attention to colostomy: Secondary | ICD-10-CM | POA: Diagnosis not present

## 2018-01-14 DIAGNOSIS — Z85038 Personal history of other malignant neoplasm of large intestine: Secondary | ICD-10-CM | POA: Diagnosis not present

## 2018-01-14 DIAGNOSIS — I130 Hypertensive heart and chronic kidney disease with heart failure and stage 1 through stage 4 chronic kidney disease, or unspecified chronic kidney disease: Secondary | ICD-10-CM | POA: Diagnosis not present

## 2018-01-14 DIAGNOSIS — Z4801 Encounter for change or removal of surgical wound dressing: Secondary | ICD-10-CM | POA: Diagnosis not present

## 2018-01-14 DIAGNOSIS — N183 Chronic kidney disease, stage 3 (moderate): Secondary | ICD-10-CM | POA: Diagnosis not present

## 2018-01-14 DIAGNOSIS — I739 Peripheral vascular disease, unspecified: Secondary | ICD-10-CM | POA: Diagnosis not present

## 2018-01-14 DIAGNOSIS — Z932 Ileostomy status: Secondary | ICD-10-CM | POA: Diagnosis not present

## 2018-01-14 DIAGNOSIS — I5042 Chronic combined systolic (congestive) and diastolic (congestive) heart failure: Secondary | ICD-10-CM | POA: Diagnosis not present

## 2018-01-14 DIAGNOSIS — Z960 Presence of urogenital implants: Secondary | ICD-10-CM | POA: Diagnosis not present

## 2018-01-14 DIAGNOSIS — I4891 Unspecified atrial fibrillation: Secondary | ICD-10-CM | POA: Diagnosis not present

## 2018-01-14 DIAGNOSIS — Z7902 Long term (current) use of antithrombotics/antiplatelets: Secondary | ICD-10-CM | POA: Diagnosis not present

## 2018-01-14 DIAGNOSIS — Z48815 Encounter for surgical aftercare following surgery on the digestive system: Secondary | ICD-10-CM | POA: Diagnosis not present

## 2018-01-14 DIAGNOSIS — Z87440 Personal history of urinary (tract) infections: Secondary | ICD-10-CM | POA: Diagnosis not present

## 2018-01-14 DIAGNOSIS — I2510 Atherosclerotic heart disease of native coronary artery without angina pectoris: Secondary | ICD-10-CM | POA: Diagnosis not present

## 2018-01-14 DIAGNOSIS — Z6841 Body Mass Index (BMI) 40.0 and over, adult: Secondary | ICD-10-CM | POA: Diagnosis not present

## 2018-01-14 DIAGNOSIS — I255 Ischemic cardiomyopathy: Secondary | ICD-10-CM | POA: Diagnosis not present

## 2018-01-14 DIAGNOSIS — J449 Chronic obstructive pulmonary disease, unspecified: Secondary | ICD-10-CM | POA: Diagnosis not present

## 2018-01-14 DIAGNOSIS — E669 Obesity, unspecified: Secondary | ICD-10-CM | POA: Diagnosis not present

## 2018-01-14 DIAGNOSIS — Z951 Presence of aortocoronary bypass graft: Secondary | ICD-10-CM | POA: Diagnosis not present

## 2018-01-14 DIAGNOSIS — Z433 Encounter for attention to colostomy: Secondary | ICD-10-CM | POA: Diagnosis not present

## 2018-01-16 DIAGNOSIS — Z48815 Encounter for surgical aftercare following surgery on the digestive system: Secondary | ICD-10-CM | POA: Diagnosis not present

## 2018-01-16 DIAGNOSIS — Z4801 Encounter for change or removal of surgical wound dressing: Secondary | ICD-10-CM | POA: Diagnosis not present

## 2018-01-16 DIAGNOSIS — I130 Hypertensive heart and chronic kidney disease with heart failure and stage 1 through stage 4 chronic kidney disease, or unspecified chronic kidney disease: Secondary | ICD-10-CM | POA: Diagnosis not present

## 2018-01-16 DIAGNOSIS — Z433 Encounter for attention to colostomy: Secondary | ICD-10-CM | POA: Diagnosis not present

## 2018-01-16 DIAGNOSIS — I2510 Atherosclerotic heart disease of native coronary artery without angina pectoris: Secondary | ICD-10-CM | POA: Diagnosis not present

## 2018-01-16 DIAGNOSIS — I5042 Chronic combined systolic (congestive) and diastolic (congestive) heart failure: Secondary | ICD-10-CM | POA: Diagnosis not present

## 2018-01-22 DIAGNOSIS — I5042 Chronic combined systolic (congestive) and diastolic (congestive) heart failure: Secondary | ICD-10-CM | POA: Diagnosis not present

## 2018-01-22 DIAGNOSIS — Z433 Encounter for attention to colostomy: Secondary | ICD-10-CM | POA: Diagnosis not present

## 2018-01-22 DIAGNOSIS — Z48815 Encounter for surgical aftercare following surgery on the digestive system: Secondary | ICD-10-CM | POA: Diagnosis not present

## 2018-01-22 DIAGNOSIS — I2510 Atherosclerotic heart disease of native coronary artery without angina pectoris: Secondary | ICD-10-CM | POA: Diagnosis not present

## 2018-01-22 DIAGNOSIS — I130 Hypertensive heart and chronic kidney disease with heart failure and stage 1 through stage 4 chronic kidney disease, or unspecified chronic kidney disease: Secondary | ICD-10-CM | POA: Diagnosis not present

## 2018-01-22 DIAGNOSIS — Z4801 Encounter for change or removal of surgical wound dressing: Secondary | ICD-10-CM | POA: Diagnosis not present

## 2018-01-26 ENCOUNTER — Other Ambulatory Visit: Payer: Self-pay | Admitting: Nurse Practitioner

## 2018-01-26 DIAGNOSIS — R0602 Shortness of breath: Secondary | ICD-10-CM

## 2018-01-26 DIAGNOSIS — Z4801 Encounter for change or removal of surgical wound dressing: Secondary | ICD-10-CM | POA: Diagnosis not present

## 2018-01-26 DIAGNOSIS — Z48815 Encounter for surgical aftercare following surgery on the digestive system: Secondary | ICD-10-CM | POA: Diagnosis not present

## 2018-01-26 DIAGNOSIS — I471 Supraventricular tachycardia: Secondary | ICD-10-CM

## 2018-01-26 DIAGNOSIS — I259 Chronic ischemic heart disease, unspecified: Secondary | ICD-10-CM

## 2018-01-26 DIAGNOSIS — I2510 Atherosclerotic heart disease of native coronary artery without angina pectoris: Secondary | ICD-10-CM | POA: Diagnosis not present

## 2018-01-26 DIAGNOSIS — I5042 Chronic combined systolic (congestive) and diastolic (congestive) heart failure: Secondary | ICD-10-CM | POA: Diagnosis not present

## 2018-01-26 DIAGNOSIS — Z433 Encounter for attention to colostomy: Secondary | ICD-10-CM | POA: Diagnosis not present

## 2018-01-26 DIAGNOSIS — E7800 Pure hypercholesterolemia, unspecified: Secondary | ICD-10-CM

## 2018-01-26 DIAGNOSIS — I5022 Chronic systolic (congestive) heart failure: Secondary | ICD-10-CM

## 2018-01-26 DIAGNOSIS — I130 Hypertensive heart and chronic kidney disease with heart failure and stage 1 through stage 4 chronic kidney disease, or unspecified chronic kidney disease: Secondary | ICD-10-CM | POA: Diagnosis not present

## 2018-01-26 MED ORDER — DILTIAZEM HCL 90 MG PO TABS: 90.0000 mg | ORAL_TABLET | Freq: Three times a day (TID) | ORAL | 2 refills | Status: DC

## 2018-01-30 DIAGNOSIS — Z433 Encounter for attention to colostomy: Secondary | ICD-10-CM | POA: Diagnosis not present

## 2018-01-30 DIAGNOSIS — I130 Hypertensive heart and chronic kidney disease with heart failure and stage 1 through stage 4 chronic kidney disease, or unspecified chronic kidney disease: Secondary | ICD-10-CM | POA: Diagnosis not present

## 2018-01-30 DIAGNOSIS — Z48815 Encounter for surgical aftercare following surgery on the digestive system: Secondary | ICD-10-CM | POA: Diagnosis not present

## 2018-01-30 DIAGNOSIS — I2510 Atherosclerotic heart disease of native coronary artery without angina pectoris: Secondary | ICD-10-CM | POA: Diagnosis not present

## 2018-01-30 DIAGNOSIS — I5042 Chronic combined systolic (congestive) and diastolic (congestive) heart failure: Secondary | ICD-10-CM | POA: Diagnosis not present

## 2018-01-30 DIAGNOSIS — Z4801 Encounter for change or removal of surgical wound dressing: Secondary | ICD-10-CM | POA: Diagnosis not present

## 2018-02-01 DIAGNOSIS — Z4801 Encounter for change or removal of surgical wound dressing: Secondary | ICD-10-CM | POA: Diagnosis not present

## 2018-02-01 DIAGNOSIS — Z48815 Encounter for surgical aftercare following surgery on the digestive system: Secondary | ICD-10-CM | POA: Diagnosis not present

## 2018-02-01 DIAGNOSIS — I130 Hypertensive heart and chronic kidney disease with heart failure and stage 1 through stage 4 chronic kidney disease, or unspecified chronic kidney disease: Secondary | ICD-10-CM | POA: Diagnosis not present

## 2018-02-01 DIAGNOSIS — I5042 Chronic combined systolic (congestive) and diastolic (congestive) heart failure: Secondary | ICD-10-CM | POA: Diagnosis not present

## 2018-02-01 DIAGNOSIS — I2510 Atherosclerotic heart disease of native coronary artery without angina pectoris: Secondary | ICD-10-CM | POA: Diagnosis not present

## 2018-02-01 DIAGNOSIS — Z433 Encounter for attention to colostomy: Secondary | ICD-10-CM | POA: Diagnosis not present

## 2018-02-06 DIAGNOSIS — Z48815 Encounter for surgical aftercare following surgery on the digestive system: Secondary | ICD-10-CM | POA: Diagnosis not present

## 2018-02-06 DIAGNOSIS — I2510 Atherosclerotic heart disease of native coronary artery without angina pectoris: Secondary | ICD-10-CM | POA: Diagnosis not present

## 2018-02-06 DIAGNOSIS — Z4801 Encounter for change or removal of surgical wound dressing: Secondary | ICD-10-CM | POA: Diagnosis not present

## 2018-02-06 DIAGNOSIS — I130 Hypertensive heart and chronic kidney disease with heart failure and stage 1 through stage 4 chronic kidney disease, or unspecified chronic kidney disease: Secondary | ICD-10-CM | POA: Diagnosis not present

## 2018-02-06 DIAGNOSIS — I5042 Chronic combined systolic (congestive) and diastolic (congestive) heart failure: Secondary | ICD-10-CM | POA: Diagnosis not present

## 2018-02-06 DIAGNOSIS — Z433 Encounter for attention to colostomy: Secondary | ICD-10-CM | POA: Diagnosis not present

## 2018-02-09 DIAGNOSIS — I2510 Atherosclerotic heart disease of native coronary artery without angina pectoris: Secondary | ICD-10-CM | POA: Diagnosis not present

## 2018-02-09 DIAGNOSIS — Z48815 Encounter for surgical aftercare following surgery on the digestive system: Secondary | ICD-10-CM | POA: Diagnosis not present

## 2018-02-09 DIAGNOSIS — Z4801 Encounter for change or removal of surgical wound dressing: Secondary | ICD-10-CM | POA: Diagnosis not present

## 2018-02-09 DIAGNOSIS — Z433 Encounter for attention to colostomy: Secondary | ICD-10-CM | POA: Diagnosis not present

## 2018-02-09 DIAGNOSIS — I130 Hypertensive heart and chronic kidney disease with heart failure and stage 1 through stage 4 chronic kidney disease, or unspecified chronic kidney disease: Secondary | ICD-10-CM | POA: Diagnosis not present

## 2018-02-09 DIAGNOSIS — I5042 Chronic combined systolic (congestive) and diastolic (congestive) heart failure: Secondary | ICD-10-CM | POA: Diagnosis not present

## 2018-02-16 DIAGNOSIS — I2510 Atherosclerotic heart disease of native coronary artery without angina pectoris: Secondary | ICD-10-CM | POA: Diagnosis not present

## 2018-02-16 DIAGNOSIS — Z4801 Encounter for change or removal of surgical wound dressing: Secondary | ICD-10-CM | POA: Diagnosis not present

## 2018-02-16 DIAGNOSIS — Z433 Encounter for attention to colostomy: Secondary | ICD-10-CM | POA: Diagnosis not present

## 2018-02-16 DIAGNOSIS — I130 Hypertensive heart and chronic kidney disease with heart failure and stage 1 through stage 4 chronic kidney disease, or unspecified chronic kidney disease: Secondary | ICD-10-CM | POA: Diagnosis not present

## 2018-02-16 DIAGNOSIS — I5042 Chronic combined systolic (congestive) and diastolic (congestive) heart failure: Secondary | ICD-10-CM | POA: Diagnosis not present

## 2018-02-16 DIAGNOSIS — Z48815 Encounter for surgical aftercare following surgery on the digestive system: Secondary | ICD-10-CM | POA: Diagnosis not present

## 2018-02-20 DIAGNOSIS — Z433 Encounter for attention to colostomy: Secondary | ICD-10-CM | POA: Diagnosis not present

## 2018-02-20 DIAGNOSIS — Z4801 Encounter for change or removal of surgical wound dressing: Secondary | ICD-10-CM | POA: Diagnosis not present

## 2018-02-20 DIAGNOSIS — I130 Hypertensive heart and chronic kidney disease with heart failure and stage 1 through stage 4 chronic kidney disease, or unspecified chronic kidney disease: Secondary | ICD-10-CM | POA: Diagnosis not present

## 2018-02-20 DIAGNOSIS — I5042 Chronic combined systolic (congestive) and diastolic (congestive) heart failure: Secondary | ICD-10-CM | POA: Diagnosis not present

## 2018-02-20 DIAGNOSIS — I2510 Atherosclerotic heart disease of native coronary artery without angina pectoris: Secondary | ICD-10-CM | POA: Diagnosis not present

## 2018-02-20 DIAGNOSIS — Z48815 Encounter for surgical aftercare following surgery on the digestive system: Secondary | ICD-10-CM | POA: Diagnosis not present

## 2018-02-23 DIAGNOSIS — I2510 Atherosclerotic heart disease of native coronary artery without angina pectoris: Secondary | ICD-10-CM | POA: Diagnosis not present

## 2018-02-23 DIAGNOSIS — Z4801 Encounter for change or removal of surgical wound dressing: Secondary | ICD-10-CM | POA: Diagnosis not present

## 2018-02-23 DIAGNOSIS — Z433 Encounter for attention to colostomy: Secondary | ICD-10-CM | POA: Diagnosis not present

## 2018-02-23 DIAGNOSIS — Z48815 Encounter for surgical aftercare following surgery on the digestive system: Secondary | ICD-10-CM | POA: Diagnosis not present

## 2018-02-23 DIAGNOSIS — I130 Hypertensive heart and chronic kidney disease with heart failure and stage 1 through stage 4 chronic kidney disease, or unspecified chronic kidney disease: Secondary | ICD-10-CM | POA: Diagnosis not present

## 2018-02-23 DIAGNOSIS — I5042 Chronic combined systolic (congestive) and diastolic (congestive) heart failure: Secondary | ICD-10-CM | POA: Diagnosis not present

## 2018-02-28 DIAGNOSIS — I130 Hypertensive heart and chronic kidney disease with heart failure and stage 1 through stage 4 chronic kidney disease, or unspecified chronic kidney disease: Secondary | ICD-10-CM | POA: Diagnosis not present

## 2018-02-28 DIAGNOSIS — Z433 Encounter for attention to colostomy: Secondary | ICD-10-CM | POA: Diagnosis not present

## 2018-02-28 DIAGNOSIS — Z4801 Encounter for change or removal of surgical wound dressing: Secondary | ICD-10-CM | POA: Diagnosis not present

## 2018-02-28 DIAGNOSIS — I5042 Chronic combined systolic (congestive) and diastolic (congestive) heart failure: Secondary | ICD-10-CM | POA: Diagnosis not present

## 2018-02-28 DIAGNOSIS — Z48815 Encounter for surgical aftercare following surgery on the digestive system: Secondary | ICD-10-CM | POA: Diagnosis not present

## 2018-02-28 DIAGNOSIS — I2510 Atherosclerotic heart disease of native coronary artery without angina pectoris: Secondary | ICD-10-CM | POA: Diagnosis not present

## 2018-03-06 NOTE — Progress Notes (Signed)
CARDIOLOGY OFFICE NOTE  Date:  03/07/2018    Larry [name] Date of Birth: 08-Jun-1950 Medical Record #[id number]  PCP:  Lin Landsman, MD  Cardiologist:  [place]    Chief Complaint  Patient presents with  . Coronary Artery Disease  . Congestive Heart Failure    Follow up visit    History of Present Illness: Larry [name] is a 68 y.o. male who presents today for a follow up visit.  Seen for Dr. Rayann Heman.  He has an ischemic CM with severe diffuse 3VD and prior stents to the RCA in 2694, systolic and diastolic dysfunction, HTN, HLD and obesity. Has had a history of long standing atrial tach that has been treated with beta blockerand CCB therapy. Was hospitalized back in April of 2013 with a HF exacerbation - had stopped his medicines. Last EF of 40 - to 85% with diastolic dysfunction as well noted in January of 2015.   Seen by me back in October of 2014 - Was out of his medicines again. Had lost weight and was anemic - was sending to GI and ended up having colon cancer.   He was admitted (02/02/2013 - 03/01/2013) to [facility] with subacute history of progressive weakness, constipation and difficulty eating and drinking. He presented with acute renal failure (Creatinine 7.83) and severe anemia (Hbg of 7.3) as well as obstruction. Colonoscopy doing this admission with biopsy revealed adenocarcinoma and patient underwent exploratory lap with partial colectomy with colostomy on 12/23 by Dr. Hulen Skains. Recovery was complicated by wound dehissance, requiring a return to the OR 12/29 with abdominal wound closure with VAC placment. After the procedure, the patient developed atrial tachycardia, so a dilt drip was started (12/29-1/2). On 1/4, the patient had a cardiac arrest, requiring defibrillation, and was subsequently transferred to the ICU. QT was found to be >700, with mild hypokalemia and hypomagnesemia implicated as the cause. Lidocaine drip was started.  Electrolyte abnormality treated and 2D-echo revealed an EF of 40-45% with periapical akinesis and biatrial enlargement. He experienced recurrent torsades on 01/05 and shocked in NSR. By 1/6, QTc had decreased to 499, lidocaine drip was d/c'ed, and patient was transferred out of ICU to stepdown. Then admitted to physical medicine and rehabilitation on 01/09 due to severe deconditioning. He was continued on protein supplement to promote wound healing. His abdominal wound was treated with VAC through 03/05/13. His heart rate was controlled without recurrent arrythmia and was maintained on potassium and magnesium supplements. He was discharged to home on 03/07/2013. He was last evaluated by Dr. Hulen Skains on 02/03 with review of his pathology demonstrating invasive adenocarcinoma of the transverse colon with penetration into the pericolonic fat. He had 0/18 lymph nodes negative for metastatic disease. He was pathologic stage [id number].   Saw Dr. Gwenlyn Found in early July of 2015 for a foot ulcer - basically normal ABI'snoted at that time.  He was readmitted to the hospital in August of 2015 - triggered by too much salt. Presented with respiratory distress - treated with Bipap and quickly stabilized. Was diuresed about 6 liters as well. Started on low dose ACE. Sleep study recommended.  I have seen back here several times since that hospitalizationand have basically followed him since-he got referred for a sleep study and is on CPAP -in March of 2016 - wanting to get his colostomy reversed and needing pre op clearance. Had a Myoview to risk stratify - this was high risk and I referred him on  for cardiac cath with Dr. Burt Knack. Underwent PCI to the LAD with DES x 2. Committed to DAPT for one year ideally. He had to wait on colostomy reversal due to being on DAPT.  Seen at the cancer center back in [facility] 2017-HR elevated - asked to follow up here - I increased his Coreg further. He ended up going in to the  hospital for aweekend - still tachycardic - switched over to daily dosing of Verapamil and changed Coreg to Lopressor - looks like his ACE was stopped - pulmonary wanted him on ARB which was to be considered as an outpatient. Seen by pulmonary for a cough which led to a CT scan. HR in the 130's the whole time while in the ER. I then saw him back like 3 days later - he was feeling fine. Not short of breath. No more cough.   InNovember of 2017 - was having his tests done for planned reversal of his colostomy. Colostomy reversal occurred in February -thisdid not go well - had an anastomotic leakon POD 7 - ended up with an ileostomy and required a prolongedstay at [facility] respiratory failure. He was seen by cardiology forintermittentatrialtachycardia.  I saw him back in Julyof 2018 after he had been home from [facility] for about a month - he wasout of several of his medicines.HR was up.No awareness of his heart beating fast.  I have seen him several times since. I have referred him back to Dr. Rayann Heman to review his medical therapy and to make sure we were all on track with the current plan of care. Has chronic and massive lower extremity edema - options are very limited and CCB will have to be continued.   He was admitted in May of 2019 - had sepsis from a UTI. His medicines were cut back again. We have had several phone calls from Home health about his elevated HRafter discharge- got him back on his usual dose of CCB therapy and beta blocker.   He underwent GU procedures in June - despite being at high risk for any procedures going forward from our standpoint. He did ok.I then saw him at the later part of June - he was doing ok - still no sensation of any palpitations or his heart rate. Remains pretty sedentary. Last visit was back in September - he had lost weight, his swelling had improved. He was felt to be doing well from our standpoint.   Comes in today. Here alone.Fortunately,  feels ok but has gained 14 pounds - says it was the holidays. Pretty sedentary. No chest pain. Not short of breath. He has been watching commercials on TV about teeth - he is asking about implants/dentures. He has not been to the dentist "in quite some time" and has lots of apprehension.   Past Medical History:  Diagnosis Date  . Arthritis   . Atrial tachycardia ([place])    managed on beta blocker therapy  . Childhood asthma    "went away after I was 16"  . Chronic combined systolic and diastolic CHF, NYHA class 3 (HCC)    has diastolic heart failure grade 1; EF is 45 to 50% per echo 05/2011; EF 41% by Myoview 2016  . CKD (chronic kidney disease) stage 3, GFR 30-59 ml/min (HCC)   . Colon cancer ([place]) 2015   MSI high; IHC loss of MLH1 and PMS2; BRAF negative; Negative methylation  . COPD (chronic obstructive pulmonary disease) ([place])   . Coronary artery disease   .  Family history of ovarian cancer   . Hypercholesteremia   . Hypertension   . Noncompliance   . NSVT (nonsustained ventricular tachycardia) (HCC)    beta blocker restarted  . Obesity   . OSA on CPAP    used nightly, pt does not know settings  . Peripheral arterial disease (HCC)    4.2 cm thoracic aortic aneurysm per chest ct 11-14-15 epic  . S/P colostomy ([place])    2014  . Thrombocytopenia ([place])   . Torsades de pointes ([place])    X 2 episodes during hospital visit 12'14"electrolyte imbalance"- "Shocked"    Past Surgical History:  Procedure Laterality Date  . COLON SURGERY    . COLONOSCOPY N/A 02/08/2013   Procedure: COLONOSCOPY;  Surgeon: [name], MD;  Location: [place];  Service: Endoscopy;  Laterality: N/A;  . COLONOSCOPY WITH PROPOFOL N/A 05/09/2014   Procedure: COLONOSCOPY WITH PROPOFOL;  Surgeon: [name], MD;  Location: [facility];  Service: Endoscopy;  Laterality: N/A;  . COLONOSCOPY WITH PROPOFOL N/A 01/08/2016   Procedure: COLONOSCOPY WITH PROPOFOL;  Surgeon: [name], MD;  [facility];  Service: Endoscopy;  Laterality: N/A;  . COLOSTOMY N/A 04/19/2016   Procedure: COLOSTOMY;  Surgeon: Judeth Horn, MD;  Location: [place];  Service: General;  Laterality: N/A;  . COLOSTOMY REVERSAL  04/12/2016  . COLOSTOMY REVERSAL N/A 04/12/2016   Procedure: COLOSTOMY REVERSAL;  Surgeon: Judeth Horn, MD;  Location: [place];  Service: General;  Laterality: N/A;  . CORONARY ANGIOPLASTY WITH STENT PLACEMENT  11/12/2008; 06/11/2014   stent x 2 to RCA; stent x 2 to LAD  . CORONARY ANGIOPLASTY WITH STENT PLACEMENT  06/11/2014   m-LAD 3.5 x 16 mm Synergy DES, d-LAD  2.25 x 16 mm Synergy DES  . CYSTOSCOPY W/ URETERAL STENT PLACEMENT Left 06/30/2017   Procedure: CYSTOSCOPY WITH RETROGRADE PYELOGRAM/URETERAL STENT PLACEMENT;  Surgeon: Lucas Mallow, MD;  Location: [place];  Service: Urology;  Laterality: Left;  . CYSTOSCOPY WITH LITHOLAPAXY N/A 08/02/2017   Procedure: CYSTOSCOPY WITH BLADDER STONE EXTRACTION;  Surgeon: Lucas Mallow, MD;  [facility];  Service: Urology;  Laterality: N/A;  . CYSTOSCOPY WITH RETROGRADE PYELOGRAM, URETEROSCOPY AND STENT PLACEMENT Left 08/02/2017   Procedure: CYSTOSCOPY WITH LEFT RETROGRADE PYELOGRAM, URETEROSCOPY AND STENT EXCHANGE;  Surgeon: Lucas Mallow, MD;  [facility];  Service: Urology;  Laterality: Left;  . ESOPHAGOGASTRODUODENOSCOPY N/A 02/08/2013   Procedure: ESOPHAGOGASTRODUODENOSCOPY (EGD);  Surgeon: [name], MD;  [facility];  Service: Endoscopy;  Laterality: N/A;  . FLEXIBLE SIGMOIDOSCOPY N/A 02/19/2016   Procedure: FLEXIBLE SIGMOIDOSCOPY;  Surgeon: [name], MD;  [facility];  Service: Endoscopy;  Laterality: N/A;  . HOLMIUM LASER APPLICATION Left 5/85/9292   Procedure: HOLMIUM LASER APPLICATION;  Surgeon: Lucas Mallow, MD;  [facility];  Service: Urology;  Laterality: Left;  . LAPAROTOMY N/A 02/12/2013   Procedure: EXPLORATORY LAPAROTOMY PARTIAL COLECTOMY WITH COLOSTOMY;  Surgeon: [name], MD;  Location: [place];  Service: General;  Laterality: N/A;  . LAPAROTOMY N/A 02/18/2013   Procedure: EXPLORATORY LAPAROTOMY/Closure of Wound;  Surgeon: [name], MD;  Location: [place];  Service: General;  Laterality: N/A;  . LAPAROTOMY N/A 04/19/2016   Procedure: EXPLORATORY LAPAROTOMY, REPAIR OF ANASTAMOTIC LEAK;  Surgeon: Judeth Horn, MD;  Location: [place];  Service: General;  Laterality: N/A;  . LEFT HEART CATHETERIZATION WITH CORONARY ANGIOGRAM N/A 06/11/2014   Procedure: LEFT HEART CATHETERIZATION WITH CORONARY ANGIOGRAM;  Surgeon: [name], MD; CFX calcified, 30-40 percent, RCA calcified, 40/50/40%, PDA diffuse disease, LAD 40/75/90% s/p DES 2      Medications: Current Meds  Medication Sig  . aspirin EC 81 MG tablet Take 1 tablet (81 mg total) by mouth daily.  [name] Kitchen atorvastatin (LIPITOR) 40 MG tablet TAKE 1 TABLET BY MOUTH EVERY DAY  . clopidogrel (PLAVIX) 75 MG tablet Take 75 mg by mouth daily.  [name] Kitchen diltiazem (CARDIZEM) 90 MG tablet Take 1 tablet (90 mg total) by mouth 3 (three) times daily.  . ferrous sulfate 325 (65 FE) MG tablet Take 1 tablet (325 mg total) by mouth daily with breakfast.  . furosemide (LASIX) 40 MG tablet Take 1.5 tablets (60 mg total) by mouth daily.  [name] Kitchen levalbuterol (XOPENEX HFA) 45 MCG/ACT inhaler INHALE 2 PUFFS INTO THE LUNGS EVERY 4 (FOUR) HOURS AS NEEDED FOR WHEEZING.  . metoprolol tartrate (LOPRESSOR) 25 MG tablet TAKE 1.5 TABLETS (37.5 MG TOTAL) BY MOUTH 2 (TWO) TIMES DAILY.  . nitroGLYCERIN (NITROSTAT) 0.4 MG SL tablet Place 0.4 mg under the tongue every 5 (five) minutes as needed for chest pain.  . simethicone (GAS-X EXTRA STRENGTH) 125 MG chewable tablet Chew 125 mg by mouth 3 (three) times daily.  [name] Kitchen spironolactone (ALDACTONE) 25 MG tablet Take 0.5 tablets (12.5 mg total) by mouth daily.  . tamsulosin (FLOMAX) 0.4 MG CAPS capsule Take 1 capsule (0.4 mg total) by mouth daily.     Allergies: No Known Allergies  Social History: The patient   reports that he quit smoking about 9 years ago. His smoking use included cigarettes. He has a 38.00 pack-year smoking history. He has never used smokeless tobacco. He reports current alcohol use. He reports that he does not use drugs.   Family History: The patient's family history includes Dementia in his mother; Diabetes in his father; Heart disease in his father; Hypertension in his father and mother; Leukemia in his maternal uncle; Liver cancer in his maternal grandmother; Parkinsonism in his mother; Prostate cancer in his maternal uncle.   Review of Systems: Please see the history of present illness.   Otherwise, the review of systems is positive for none.   All other systems are reviewed and negative.   Physical Exam: VS:  BP 110/68 (BP Location: Left Arm, Patient Position: Sitting, Cuff Size: Large)   Pulse 69   Ht 5' 11"  (1.803 m)   Wt (!) 353 lb (160.1 kg)   SpO2 96% Comment: at rest  BMI 49.23 kg/m  .  BMI Body mass index is 49.23 kg/m.  Wt Readings from Last 3 Encounters:  03/07/18 (!) 353 lb (160.1 kg)  11/15/17 (!) 339 lb (153.8 kg)  08/14/17 (!) 345 lb 6.4 oz (156.7 kg)    General: Pleasant. Alert and in no acute distress.  He remains morbidly obese.  HEENT: Normal.  Neck: Supple, no JVD, carotid bruits, or masses noted.  Cardiac: Regular rate and rhythm. Heart tones are distant. HR is about 80 by my count. He has massive chronic edema.   Respiratory:  Lungs are clear to auscultation bilaterally with normal work of breathing.  GI: Soft and nontender. Has colostomy bag in place.  MS: No deformity or atrophy. Gait and ROM intact.  Skin: Warm and dry. Color is normal.  Neuro:  Strength and sensation are intact and no gross focal deficits noted.  Psych: Alert, appropriate and with normal affect.   LABORATORY DATA:  EKG:  EKG is not ordered today.  Lab Results  Component Value Date   WBC 6.1 07/31/2017   HGB 11.1 (L) 07/31/2017   HCT 35.5 (L) 07/31/2017   PLT 135  (L) 07/31/2017   GLUCOSE 86 08/21/2017   CHOL 148 05/03/2017   TRIG 171 (H) 05/03/2017   HDL 54 05/03/2017   LDLCALC 60 05/03/2017   ALT 20 07/01/2017   AST 19 07/01/2017   NA 144 08/21/2017   K 3.9 08/21/2017   CL 106 08/21/2017   CREATININE 1.78 (H) 08/21/2017   BUN 17 08/21/2017   CO2 23 08/21/2017   TSH 1.080 05/14/2016   INR 1.31 06/30/2017   HGBA1C 5.9 (H) 05/14/2016     BNP (last 3 results) No results for input(s): BNP in the last 8760 hours.  ProBNP (last 3 results) Recent Labs    05/03/17 1021  PROBNP 126     Other Studies Reviewed Today:  EchoStudy Conclusions2/2018  - Left ventricle: The cavity size was normal. Wall thickness was increased in a pattern of moderate LVH. Systolic function was normal. The estimated ejection fraction was in the range of 55% to 60%. Wall motion was normal; there were no regional wall motion abnormalities. The study is not technically sufficient to allow evaluation of LV diastolic function. - Aortic root: The aortic root was mildly dilated. - Mitral valve: Calcified annulus.  Impressions:  - Technically difficult; definity used; normal LV function; moderate LVH.   MyoviewStudy Highlights12/2017   Nuclear stress EF: 58%.  There was no ST segment deviation noted during stress.  There is a large defect of severe severity present in the basal inferior, mid inferior, apical septal, apical inferior and apex location. The defect is non-reversible. No ischemia noted. Tthis is consistent with diaphragmatic attenuation artifact vs. Scar.  This is a low risk study.  The left ventricular ejection fraction is normal (55-65%).      Procedure: Left Heart Cath, Selective Coronary Angiography, PTCA and stenting of the mid and distal LAD April 2016.  Coronary angiography: Left mainstem: The left mainstem is patent. The vessel divides into the LAD and left circumflex. There is no significant  stenosis, but there is mild irregularity.  Left anterior descending (LAD): The LAD is moderately calcified. The vessel is severely diseased. The proximal LAD is patent with 30-40% stenosis after the second septal perforator. There is diffuse calcification. The diagonal branches are small. The mid LAD has an eccentric 75% stenosis at the origin of the second diagonal branch. Beyond that area the mid and distal LAD are diffusely diseased. There is another severe stenosis in the apical LAD of 90%.  Left circumflex (LCx): The left circumflex is calcified. The vessel is patent with mild irregularity. There are no high-grade stenoses identified. There are scattered 30-40% stenoses throughout the proximal and mid circumflex as well as the first OM branch.  Right coronary artery (RCA): The RCA is dominant. The vessel is heavily calcified. The stented segments in the mid and distal RCA are patent. The proximal vessel has 30-40% stenosis. The mid vessel has 30-40% stenosis. The distal vessel is patent with 50-60% stenosis involving the origin of the PDA. The PDA is diffusely diseased.  Left ventriculography: Deferred because of chronic kidney disease. By nuclear scan the LVEF is 41%.  PCI Note: Following the diagnostic procedure, the decision was made to proceed with PCI of the mid and apical LAD. The right coronary artery had patent stents in the left circumflex had nonobstructive disease. The LAD is a diffusely diseased vessel and the mid  and distal vessel would be a poor surgical targets. I felt that PCI was the best option in this patient with a high risk nuclear scan demonstrating anteroapical and anterolateral ischemia. He will require colostomy reversal, so I planned on treating him with Synergy drug-eluting stents which had a biodegradable polymer and potentially can allow for earlier interruption of dual antiplatelet therapy. The patient was loaded with Plavix 600 mg. Weight-based bivalirudin was  given for anticoagulation. Once a therapeutic ACT was achieved, a 6 [place] XB LAD 3.5 cm guide catheter was inserted. A cougar coronary guidewire was used to cross the lesion in the apical LAD. The lesion was predilated with a 2.5 x 12 mm balloon. The same balloon was used to dilate the mid lesion. The apical lesion was then stented with a 2.25 x 16 mm Synergy DES. The stent was postdilated with a 2.5 mm noncompliant balloon. The mid lesion was then stented with a 3.5 x 16 mm Synergy DES. That stent was postdilated with a 3.75 mm noncompliant balloon. Following PCI, there was 0% residual stenosis and TIMI-3 flow. Final angiography confirmed an excellent result. The patient tolerated the procedure well. There were no immediate procedural complications. A TR band was used for radial hemostasis. The patient was transferred to the post catheterization recovery area for further monitoring.  PCI Data: Lesion 1: Vessel - LAD/Segment - distal/apical Percent Stenosis (pre) 90 TIMI-flow 3 Stent 2.25 x 16 mm Synergy DES Percent Stenosis (post) 0 TIMI-flow (post) 3  Lesion 2: Vessel - LAD/Segment - mid Percent Stenosis (pre) 75 TIMI-flow 3 Stent 3.5 x 16 mm Synergy DES Percent Stenosis (post) 0 TIMI-flow (post) 3  Estimated Blood Loss: Minimal  Final Conclusions:  1. Two-vessel coronary artery disease with continued patency of the stented segments in the right coronary artery and severe stenoses of the mid and apical LAD 2. Mild diffuse nonobstructive left circumflex stenosis 3. Successful PCI of the LAD using 2 drug-eluting stents   Recommendations:  Dual antiplatelet therapy for at least 6 months. Would be reasonable to consider interruption of aspirin and Plavix at 6 months for colostomy reversal.  [name] MD, [facility] 06/11/2014, 10:06 AM   Assessment / Plan:  1.Intermitted atrial tach - this has been chronic for many years - he has never been symptomatic - he is  managed medically. We have had Dr. Rayann Heman re-evaluate - he is not a good candidate for any EP procedures or AAD therapies - he is managed conservatively. HR remains ok. He remains without any symptoms.   2.CAD with prior PCI to the RCA - S/P cath with PCI to the LAD back in April of 2016 following high risk Myoview - has had DES x 2 to the [place] 2016-he remains on chronic DAPT therapy - his last Myoview from 01/2016 was low risk.We continue to manage him medically. CV risk factor modification is challenging at best - encouraged him to try and get back on track.   3.History of combined systolic and diastolic HF -lastecho from 2018with normal EF but also a very technically difficult study. Weight is back up - fortunately, feels pretty good.   4. Colon cancer - followed by oncology -has had attempt at colostomy reversal now with ileostomy in place.This will be long term.Not discussed today  5. HTN -BP looks good - no changes made today.  6. CKD - lab today  7. OSA - now on CPAP and followed by Dr. Annamaria Boots- using sporadically.  8. HLD - on statin  therapy - lab today  9. Prior GU procedures - he has been released from GU - I have refilled his Flomax last year to avoid urinary retention with potential for recurrent UTI, then possible recurrent sepsis, etc.  Current medicines are reviewed with the patient today.  The patient does not have concerns regarding medicines other than what has been noted above.  The following changes have been made:  See above.  Labs/ tests ordered today include:    Orders Placed This Encounter  Procedures  . Basic metabolic panel  . CBC  . Hepatic function panel  . Lipid panel     Disposition:   FU with me in about 6 months. Overall situation remains tenuous at best.   Patient is agreeable to this plan and will call if any problems develop in the interim.   SignedTruitt Merle, NP  03/07/2018 12:25 PM  [facility] [address] [place], [place]  [address] Phone: [phone] Fax: [phone]

## 2018-03-07 ENCOUNTER — Ambulatory Visit (INDEPENDENT_AMBULATORY_CARE_PROVIDER_SITE_OTHER): Payer: Medicare Other | Admitting: Nurse Practitioner

## 2018-03-07 ENCOUNTER — Encounter: Payer: Self-pay | Admitting: Nurse Practitioner

## 2018-03-07 VITALS — BP 110/68 | HR 69 | Ht 71.0 in | Wt 353.0 lb

## 2018-03-07 DIAGNOSIS — I259 Chronic ischemic heart disease, unspecified: Secondary | ICD-10-CM

## 2018-03-07 DIAGNOSIS — I5022 Chronic systolic (congestive) heart failure: Secondary | ICD-10-CM

## 2018-03-07 DIAGNOSIS — R0602 Shortness of breath: Secondary | ICD-10-CM | POA: Diagnosis not present

## 2018-03-07 DIAGNOSIS — E7800 Pure hypercholesterolemia, unspecified: Secondary | ICD-10-CM

## 2018-03-07 NOTE — Patient Instructions (Signed)
We will be checking the following labs today - BMET, CBC, HPF and lipids   Medication Instructions:    Continue with your current medicines.    If you need a refill on your cardiac medications before your next appointment, please call your pharmacy.     Testing/Procedures To Be Arranged:  N/A  Follow-Up:   See me in 6 months    At [facility], you and your health needs are our priority.  As part of our continuing mission to provide you with exceptional heart care, we have created designated Provider Care Teams.  These Care Teams include your primary Cardiologist (physician) and Advanced Practice Providers (APPs -  Physician Assistants and Nurse Practitioners) who all work together to provide you with the care you need, when you need it.  Special Instructions:  . Ok to go to your regular dentist for a cleaning and evaluation  Call the [place] office at [phone] if you have any questions, problems or concerns.

## 2018-03-08 LAB — CBC
Hematocrit: 38.8 % (ref 37.5–51.0)
Hemoglobin: 12.9 g/dL — ABNORMAL LOW (ref 13.0–17.7)
MCH: 29.6 pg (ref 26.6–33.0)
MCHC: 33.2 g/dL (ref 31.5–35.7)
MCV: 89 fL (ref 79–97)
Platelets: 141 10*3/uL — ABNORMAL LOW (ref 150–450)
RBC: 4.36 x10E6/uL (ref 4.14–5.80)
RDW: 13.7 % (ref 11.6–15.4)
WBC: 5.8 10*3/uL (ref 3.4–10.8)

## 2018-03-08 LAB — BASIC METABOLIC PANEL
BUN/Creatinine Ratio: 14 (ref 10–24)
BUN: 31 mg/dL — ABNORMAL HIGH (ref 8–27)
CO2: 20 mmol/L (ref 20–29)
Calcium: 9.9 mg/dL (ref 8.6–10.2)
Chloride: 106 mmol/L (ref 96–106)
Creatinine, Ser: 2.17 mg/dL — ABNORMAL HIGH (ref 0.76–1.27)
GFR calc Af Amer: 36 mL/min/{1.73_m2} — ABNORMAL LOW (ref >59)
GFR calc non Af Amer: 31 mL/min/{1.73_m2} — ABNORMAL LOW (ref >59)
Glucose: 81 mg/dL (ref 65–99)
Potassium: 4 mmol/L (ref 3.5–5.2)
Sodium: 142 mmol/L (ref 134–144)

## 2018-03-08 LAB — HEPATIC FUNCTION PANEL
ALT: 16 IU/L (ref 0–44)
AST: 13 IU/L (ref 0–40)
Albumin: 4.1 g/dL (ref 3.6–4.8)
Alkaline Phosphatase: 110 IU/L (ref 39–117)
Bilirubin Total: 0.8 mg/dL (ref 0.0–1.2)
Bilirubin, Direct: 0.18 mg/dL (ref 0.00–0.40)
Total Protein: 7.2 g/dL (ref 6.0–8.5)

## 2018-03-08 LAB — LIPID PANEL
Chol/HDL Ratio: 2.9 ratio (ref 0.0–5.0)
Cholesterol, Total: 165 mg/dL (ref 100–199)
HDL: 56 mg/dL (ref >39)
LDL Calculated: 69 mg/dL (ref 0–99)
Triglycerides: 202 mg/dL — ABNORMAL HIGH (ref 0–149)
VLDL Cholesterol Cal: 40 mg/dL (ref 5–40)

## 2018-03-14 DIAGNOSIS — I130 Hypertensive heart and chronic kidney disease with heart failure and stage 1 through stage 4 chronic kidney disease, or unspecified chronic kidney disease: Secondary | ICD-10-CM | POA: Diagnosis not present

## 2018-03-14 DIAGNOSIS — I2510 Atherosclerotic heart disease of native coronary artery without angina pectoris: Secondary | ICD-10-CM | POA: Diagnosis not present

## 2018-03-14 DIAGNOSIS — Z433 Encounter for attention to colostomy: Secondary | ICD-10-CM | POA: Diagnosis not present

## 2018-03-14 DIAGNOSIS — Z48815 Encounter for surgical aftercare following surgery on the digestive system: Secondary | ICD-10-CM | POA: Diagnosis not present

## 2018-03-14 DIAGNOSIS — I5042 Chronic combined systolic (congestive) and diastolic (congestive) heart failure: Secondary | ICD-10-CM | POA: Diagnosis not present

## 2018-03-14 DIAGNOSIS — Z4801 Encounter for change or removal of surgical wound dressing: Secondary | ICD-10-CM | POA: Diagnosis not present

## 2018-03-15 DIAGNOSIS — Z87440 Personal history of urinary (tract) infections: Secondary | ICD-10-CM | POA: Diagnosis not present

## 2018-03-15 DIAGNOSIS — Z48815 Encounter for surgical aftercare following surgery on the digestive system: Secondary | ICD-10-CM | POA: Diagnosis not present

## 2018-03-15 DIAGNOSIS — Z7902 Long term (current) use of antithrombotics/antiplatelets: Secondary | ICD-10-CM | POA: Diagnosis not present

## 2018-03-15 DIAGNOSIS — Z960 Presence of urogenital implants: Secondary | ICD-10-CM | POA: Diagnosis not present

## 2018-03-15 DIAGNOSIS — I130 Hypertensive heart and chronic kidney disease with heart failure and stage 1 through stage 4 chronic kidney disease, or unspecified chronic kidney disease: Secondary | ICD-10-CM | POA: Diagnosis not present

## 2018-03-15 DIAGNOSIS — Z85038 Personal history of other malignant neoplasm of large intestine: Secondary | ICD-10-CM | POA: Diagnosis not present

## 2018-03-15 DIAGNOSIS — Z4801 Encounter for change or removal of surgical wound dressing: Secondary | ICD-10-CM | POA: Diagnosis not present

## 2018-03-15 DIAGNOSIS — N183 Chronic kidney disease, stage 3 (moderate): Secondary | ICD-10-CM | POA: Diagnosis not present

## 2018-03-15 DIAGNOSIS — I5042 Chronic combined systolic (congestive) and diastolic (congestive) heart failure: Secondary | ICD-10-CM | POA: Diagnosis not present

## 2018-03-15 DIAGNOSIS — I255 Ischemic cardiomyopathy: Secondary | ICD-10-CM | POA: Diagnosis not present

## 2018-03-15 DIAGNOSIS — J449 Chronic obstructive pulmonary disease, unspecified: Secondary | ICD-10-CM | POA: Diagnosis not present

## 2018-03-15 DIAGNOSIS — E669 Obesity, unspecified: Secondary | ICD-10-CM | POA: Diagnosis not present

## 2018-03-15 DIAGNOSIS — I739 Peripheral vascular disease, unspecified: Secondary | ICD-10-CM | POA: Diagnosis not present

## 2018-03-15 DIAGNOSIS — Z6841 Body Mass Index (BMI) 40.0 and over, adult: Secondary | ICD-10-CM | POA: Diagnosis not present

## 2018-03-15 DIAGNOSIS — I2510 Atherosclerotic heart disease of native coronary artery without angina pectoris: Secondary | ICD-10-CM | POA: Diagnosis not present

## 2018-03-15 DIAGNOSIS — Z951 Presence of aortocoronary bypass graft: Secondary | ICD-10-CM | POA: Diagnosis not present

## 2018-03-15 DIAGNOSIS — Z9181 History of falling: Secondary | ICD-10-CM | POA: Diagnosis not present

## 2018-03-15 DIAGNOSIS — Z932 Ileostomy status: Secondary | ICD-10-CM | POA: Diagnosis not present

## 2018-03-15 DIAGNOSIS — I4891 Unspecified atrial fibrillation: Secondary | ICD-10-CM | POA: Diagnosis not present

## 2018-03-17 DIAGNOSIS — I5042 Chronic combined systolic (congestive) and diastolic (congestive) heart failure: Secondary | ICD-10-CM | POA: Diagnosis not present

## 2018-03-17 DIAGNOSIS — I2510 Atherosclerotic heart disease of native coronary artery without angina pectoris: Secondary | ICD-10-CM | POA: Diagnosis not present

## 2018-03-17 DIAGNOSIS — Z48815 Encounter for surgical aftercare following surgery on the digestive system: Secondary | ICD-10-CM | POA: Diagnosis not present

## 2018-03-17 DIAGNOSIS — I130 Hypertensive heart and chronic kidney disease with heart failure and stage 1 through stage 4 chronic kidney disease, or unspecified chronic kidney disease: Secondary | ICD-10-CM | POA: Diagnosis not present

## 2018-03-17 DIAGNOSIS — Z4801 Encounter for change or removal of surgical wound dressing: Secondary | ICD-10-CM | POA: Diagnosis not present

## 2018-03-17 DIAGNOSIS — I255 Ischemic cardiomyopathy: Secondary | ICD-10-CM | POA: Diagnosis not present

## 2018-03-20 DIAGNOSIS — Z48815 Encounter for surgical aftercare following surgery on the digestive system: Secondary | ICD-10-CM | POA: Diagnosis not present

## 2018-03-20 DIAGNOSIS — I255 Ischemic cardiomyopathy: Secondary | ICD-10-CM | POA: Diagnosis not present

## 2018-03-20 DIAGNOSIS — Z4801 Encounter for change or removal of surgical wound dressing: Secondary | ICD-10-CM | POA: Diagnosis not present

## 2018-03-20 DIAGNOSIS — I5042 Chronic combined systolic (congestive) and diastolic (congestive) heart failure: Secondary | ICD-10-CM | POA: Diagnosis not present

## 2018-03-20 DIAGNOSIS — I130 Hypertensive heart and chronic kidney disease with heart failure and stage 1 through stage 4 chronic kidney disease, or unspecified chronic kidney disease: Secondary | ICD-10-CM | POA: Diagnosis not present

## 2018-03-20 DIAGNOSIS — I2510 Atherosclerotic heart disease of native coronary artery without angina pectoris: Secondary | ICD-10-CM | POA: Diagnosis not present

## 2018-03-23 DIAGNOSIS — Z48815 Encounter for surgical aftercare following surgery on the digestive system: Secondary | ICD-10-CM | POA: Diagnosis not present

## 2018-03-23 DIAGNOSIS — I130 Hypertensive heart and chronic kidney disease with heart failure and stage 1 through stage 4 chronic kidney disease, or unspecified chronic kidney disease: Secondary | ICD-10-CM | POA: Diagnosis not present

## 2018-03-23 DIAGNOSIS — I255 Ischemic cardiomyopathy: Secondary | ICD-10-CM | POA: Diagnosis not present

## 2018-03-23 DIAGNOSIS — Z4801 Encounter for change or removal of surgical wound dressing: Secondary | ICD-10-CM | POA: Diagnosis not present

## 2018-03-23 DIAGNOSIS — I5042 Chronic combined systolic (congestive) and diastolic (congestive) heart failure: Secondary | ICD-10-CM | POA: Diagnosis not present

## 2018-03-23 DIAGNOSIS — I2510 Atherosclerotic heart disease of native coronary artery without angina pectoris: Secondary | ICD-10-CM | POA: Diagnosis not present

## 2018-03-26 DIAGNOSIS — Z48815 Encounter for surgical aftercare following surgery on the digestive system: Secondary | ICD-10-CM | POA: Diagnosis not present

## 2018-03-26 DIAGNOSIS — Z4801 Encounter for change or removal of surgical wound dressing: Secondary | ICD-10-CM | POA: Diagnosis not present

## 2018-03-26 DIAGNOSIS — I5042 Chronic combined systolic (congestive) and diastolic (congestive) heart failure: Secondary | ICD-10-CM | POA: Diagnosis not present

## 2018-03-26 DIAGNOSIS — I255 Ischemic cardiomyopathy: Secondary | ICD-10-CM | POA: Diagnosis not present

## 2018-03-26 DIAGNOSIS — I2510 Atherosclerotic heart disease of native coronary artery without angina pectoris: Secondary | ICD-10-CM | POA: Diagnosis not present

## 2018-03-26 DIAGNOSIS — I130 Hypertensive heart and chronic kidney disease with heart failure and stage 1 through stage 4 chronic kidney disease, or unspecified chronic kidney disease: Secondary | ICD-10-CM | POA: Diagnosis not present

## 2018-03-29 DIAGNOSIS — I5042 Chronic combined systolic (congestive) and diastolic (congestive) heart failure: Secondary | ICD-10-CM | POA: Diagnosis not present

## 2018-03-29 DIAGNOSIS — Z48815 Encounter for surgical aftercare following surgery on the digestive system: Secondary | ICD-10-CM | POA: Diagnosis not present

## 2018-03-29 DIAGNOSIS — I2510 Atherosclerotic heart disease of native coronary artery without angina pectoris: Secondary | ICD-10-CM | POA: Diagnosis not present

## 2018-03-29 DIAGNOSIS — I130 Hypertensive heart and chronic kidney disease with heart failure and stage 1 through stage 4 chronic kidney disease, or unspecified chronic kidney disease: Secondary | ICD-10-CM | POA: Diagnosis not present

## 2018-03-29 DIAGNOSIS — Z4801 Encounter for change or removal of surgical wound dressing: Secondary | ICD-10-CM | POA: Diagnosis not present

## 2018-03-29 DIAGNOSIS — I255 Ischemic cardiomyopathy: Secondary | ICD-10-CM | POA: Diagnosis not present

## 2018-04-02 DIAGNOSIS — I5042 Chronic combined systolic (congestive) and diastolic (congestive) heart failure: Secondary | ICD-10-CM | POA: Diagnosis not present

## 2018-04-02 DIAGNOSIS — I130 Hypertensive heart and chronic kidney disease with heart failure and stage 1 through stage 4 chronic kidney disease, or unspecified chronic kidney disease: Secondary | ICD-10-CM | POA: Diagnosis not present

## 2018-04-02 DIAGNOSIS — I255 Ischemic cardiomyopathy: Secondary | ICD-10-CM | POA: Diagnosis not present

## 2018-04-02 DIAGNOSIS — Z48815 Encounter for surgical aftercare following surgery on the digestive system: Secondary | ICD-10-CM | POA: Diagnosis not present

## 2018-04-02 DIAGNOSIS — I2510 Atherosclerotic heart disease of native coronary artery without angina pectoris: Secondary | ICD-10-CM | POA: Diagnosis not present

## 2018-04-02 DIAGNOSIS — Z4801 Encounter for change or removal of surgical wound dressing: Secondary | ICD-10-CM | POA: Diagnosis not present

## 2018-04-05 ENCOUNTER — Other Ambulatory Visit: Payer: Self-pay | Admitting: Nurse Practitioner

## 2018-04-05 DIAGNOSIS — I4719 Other supraventricular tachycardia: Secondary | ICD-10-CM

## 2018-04-05 DIAGNOSIS — Z4801 Encounter for change or removal of surgical wound dressing: Secondary | ICD-10-CM | POA: Diagnosis not present

## 2018-04-05 DIAGNOSIS — I5042 Chronic combined systolic (congestive) and diastolic (congestive) heart failure: Secondary | ICD-10-CM | POA: Diagnosis not present

## 2018-04-05 DIAGNOSIS — I2510 Atherosclerotic heart disease of native coronary artery without angina pectoris: Secondary | ICD-10-CM | POA: Diagnosis not present

## 2018-04-05 DIAGNOSIS — I255 Ischemic cardiomyopathy: Secondary | ICD-10-CM | POA: Diagnosis not present

## 2018-04-05 DIAGNOSIS — I5022 Chronic systolic (congestive) heart failure: Secondary | ICD-10-CM

## 2018-04-05 DIAGNOSIS — I130 Hypertensive heart and chronic kidney disease with heart failure and stage 1 through stage 4 chronic kidney disease, or unspecified chronic kidney disease: Secondary | ICD-10-CM | POA: Diagnosis not present

## 2018-04-05 DIAGNOSIS — Z48815 Encounter for surgical aftercare following surgery on the digestive system: Secondary | ICD-10-CM | POA: Diagnosis not present

## 2018-04-05 DIAGNOSIS — E7800 Pure hypercholesterolemia, unspecified: Secondary | ICD-10-CM

## 2018-04-05 DIAGNOSIS — I259 Chronic ischemic heart disease, unspecified: Secondary | ICD-10-CM

## 2018-04-05 DIAGNOSIS — R0602 Shortness of breath: Secondary | ICD-10-CM

## 2018-04-05 DIAGNOSIS — I471 Supraventricular tachycardia: Secondary | ICD-10-CM

## 2018-04-09 DIAGNOSIS — G4733 Obstructive sleep apnea (adult) (pediatric): Secondary | ICD-10-CM | POA: Diagnosis not present

## 2018-04-09 DIAGNOSIS — I2510 Atherosclerotic heart disease of native coronary artery without angina pectoris: Secondary | ICD-10-CM | POA: Diagnosis not present

## 2018-04-09 DIAGNOSIS — Z85038 Personal history of other malignant neoplasm of large intestine: Secondary | ICD-10-CM | POA: Diagnosis not present

## 2018-04-10 DIAGNOSIS — I2510 Atherosclerotic heart disease of native coronary artery without angina pectoris: Secondary | ICD-10-CM | POA: Diagnosis not present

## 2018-04-10 DIAGNOSIS — Z48815 Encounter for surgical aftercare following surgery on the digestive system: Secondary | ICD-10-CM | POA: Diagnosis not present

## 2018-04-10 DIAGNOSIS — Z4801 Encounter for change or removal of surgical wound dressing: Secondary | ICD-10-CM | POA: Diagnosis not present

## 2018-04-10 DIAGNOSIS — I255 Ischemic cardiomyopathy: Secondary | ICD-10-CM | POA: Diagnosis not present

## 2018-04-10 DIAGNOSIS — I130 Hypertensive heart and chronic kidney disease with heart failure and stage 1 through stage 4 chronic kidney disease, or unspecified chronic kidney disease: Secondary | ICD-10-CM | POA: Diagnosis not present

## 2018-04-10 DIAGNOSIS — I5042 Chronic combined systolic (congestive) and diastolic (congestive) heart failure: Secondary | ICD-10-CM | POA: Diagnosis not present

## 2018-04-13 DIAGNOSIS — I2510 Atherosclerotic heart disease of native coronary artery without angina pectoris: Secondary | ICD-10-CM | POA: Diagnosis not present

## 2018-04-13 DIAGNOSIS — I255 Ischemic cardiomyopathy: Secondary | ICD-10-CM | POA: Diagnosis not present

## 2018-04-13 DIAGNOSIS — I5042 Chronic combined systolic (congestive) and diastolic (congestive) heart failure: Secondary | ICD-10-CM | POA: Diagnosis not present

## 2018-04-13 DIAGNOSIS — Z4801 Encounter for change or removal of surgical wound dressing: Secondary | ICD-10-CM | POA: Diagnosis not present

## 2018-04-13 DIAGNOSIS — I130 Hypertensive heart and chronic kidney disease with heart failure and stage 1 through stage 4 chronic kidney disease, or unspecified chronic kidney disease: Secondary | ICD-10-CM | POA: Diagnosis not present

## 2018-04-13 DIAGNOSIS — Z48815 Encounter for surgical aftercare following surgery on the digestive system: Secondary | ICD-10-CM | POA: Diagnosis not present

## 2018-04-14 DIAGNOSIS — J449 Chronic obstructive pulmonary disease, unspecified: Secondary | ICD-10-CM | POA: Diagnosis not present

## 2018-04-14 DIAGNOSIS — I5042 Chronic combined systolic (congestive) and diastolic (congestive) heart failure: Secondary | ICD-10-CM | POA: Diagnosis not present

## 2018-04-14 DIAGNOSIS — Z4801 Encounter for change or removal of surgical wound dressing: Secondary | ICD-10-CM | POA: Diagnosis not present

## 2018-04-14 DIAGNOSIS — E669 Obesity, unspecified: Secondary | ICD-10-CM | POA: Diagnosis not present

## 2018-04-14 DIAGNOSIS — I4891 Unspecified atrial fibrillation: Secondary | ICD-10-CM | POA: Diagnosis not present

## 2018-04-14 DIAGNOSIS — I130 Hypertensive heart and chronic kidney disease with heart failure and stage 1 through stage 4 chronic kidney disease, or unspecified chronic kidney disease: Secondary | ICD-10-CM | POA: Diagnosis not present

## 2018-04-14 DIAGNOSIS — Z932 Ileostomy status: Secondary | ICD-10-CM | POA: Diagnosis not present

## 2018-04-14 DIAGNOSIS — Z48815 Encounter for surgical aftercare following surgery on the digestive system: Secondary | ICD-10-CM | POA: Diagnosis not present

## 2018-04-14 DIAGNOSIS — Z960 Presence of urogenital implants: Secondary | ICD-10-CM | POA: Diagnosis not present

## 2018-04-14 DIAGNOSIS — Z85038 Personal history of other malignant neoplasm of large intestine: Secondary | ICD-10-CM | POA: Diagnosis not present

## 2018-04-14 DIAGNOSIS — I739 Peripheral vascular disease, unspecified: Secondary | ICD-10-CM | POA: Diagnosis not present

## 2018-04-14 DIAGNOSIS — Z9181 History of falling: Secondary | ICD-10-CM | POA: Diagnosis not present

## 2018-04-14 DIAGNOSIS — Z7902 Long term (current) use of antithrombotics/antiplatelets: Secondary | ICD-10-CM | POA: Diagnosis not present

## 2018-04-14 DIAGNOSIS — I2510 Atherosclerotic heart disease of native coronary artery without angina pectoris: Secondary | ICD-10-CM | POA: Diagnosis not present

## 2018-04-14 DIAGNOSIS — Z951 Presence of aortocoronary bypass graft: Secondary | ICD-10-CM | POA: Diagnosis not present

## 2018-04-14 DIAGNOSIS — Z87440 Personal history of urinary (tract) infections: Secondary | ICD-10-CM | POA: Diagnosis not present

## 2018-04-14 DIAGNOSIS — N183 Chronic kidney disease, stage 3 (moderate): Secondary | ICD-10-CM | POA: Diagnosis not present

## 2018-04-14 DIAGNOSIS — Z6841 Body Mass Index (BMI) 40.0 and over, adult: Secondary | ICD-10-CM | POA: Diagnosis not present

## 2018-04-14 DIAGNOSIS — I255 Ischemic cardiomyopathy: Secondary | ICD-10-CM | POA: Diagnosis not present

## 2018-04-16 DIAGNOSIS — Z4801 Encounter for change or removal of surgical wound dressing: Secondary | ICD-10-CM | POA: Diagnosis not present

## 2018-04-16 DIAGNOSIS — I2510 Atherosclerotic heart disease of native coronary artery without angina pectoris: Secondary | ICD-10-CM | POA: Diagnosis not present

## 2018-04-16 DIAGNOSIS — Z48815 Encounter for surgical aftercare following surgery on the digestive system: Secondary | ICD-10-CM | POA: Diagnosis not present

## 2018-04-16 DIAGNOSIS — I130 Hypertensive heart and chronic kidney disease with heart failure and stage 1 through stage 4 chronic kidney disease, or unspecified chronic kidney disease: Secondary | ICD-10-CM | POA: Diagnosis not present

## 2018-04-16 DIAGNOSIS — I255 Ischemic cardiomyopathy: Secondary | ICD-10-CM | POA: Diagnosis not present

## 2018-04-16 DIAGNOSIS — I5042 Chronic combined systolic (congestive) and diastolic (congestive) heart failure: Secondary | ICD-10-CM | POA: Diagnosis not present

## 2018-04-19 DIAGNOSIS — I5042 Chronic combined systolic (congestive) and diastolic (congestive) heart failure: Secondary | ICD-10-CM | POA: Diagnosis not present

## 2018-04-19 DIAGNOSIS — I130 Hypertensive heart and chronic kidney disease with heart failure and stage 1 through stage 4 chronic kidney disease, or unspecified chronic kidney disease: Secondary | ICD-10-CM | POA: Diagnosis not present

## 2018-04-19 DIAGNOSIS — Z4801 Encounter for change or removal of surgical wound dressing: Secondary | ICD-10-CM | POA: Diagnosis not present

## 2018-04-19 DIAGNOSIS — I255 Ischemic cardiomyopathy: Secondary | ICD-10-CM | POA: Diagnosis not present

## 2018-04-19 DIAGNOSIS — Z48815 Encounter for surgical aftercare following surgery on the digestive system: Secondary | ICD-10-CM | POA: Diagnosis not present

## 2018-04-19 DIAGNOSIS — I2510 Atherosclerotic heart disease of native coronary artery without angina pectoris: Secondary | ICD-10-CM | POA: Diagnosis not present

## 2018-04-24 DIAGNOSIS — I130 Hypertensive heart and chronic kidney disease with heart failure and stage 1 through stage 4 chronic kidney disease, or unspecified chronic kidney disease: Secondary | ICD-10-CM | POA: Diagnosis not present

## 2018-04-24 DIAGNOSIS — I255 Ischemic cardiomyopathy: Secondary | ICD-10-CM | POA: Diagnosis not present

## 2018-04-24 DIAGNOSIS — Z4801 Encounter for change or removal of surgical wound dressing: Secondary | ICD-10-CM | POA: Diagnosis not present

## 2018-04-24 DIAGNOSIS — Z48815 Encounter for surgical aftercare following surgery on the digestive system: Secondary | ICD-10-CM | POA: Diagnosis not present

## 2018-04-24 DIAGNOSIS — I5042 Chronic combined systolic (congestive) and diastolic (congestive) heart failure: Secondary | ICD-10-CM | POA: Diagnosis not present

## 2018-04-24 DIAGNOSIS — I2510 Atherosclerotic heart disease of native coronary artery without angina pectoris: Secondary | ICD-10-CM | POA: Diagnosis not present

## 2018-04-30 DIAGNOSIS — I2510 Atherosclerotic heart disease of native coronary artery without angina pectoris: Secondary | ICD-10-CM | POA: Diagnosis not present

## 2018-04-30 DIAGNOSIS — I130 Hypertensive heart and chronic kidney disease with heart failure and stage 1 through stage 4 chronic kidney disease, or unspecified chronic kidney disease: Secondary | ICD-10-CM | POA: Diagnosis not present

## 2018-04-30 DIAGNOSIS — Z4801 Encounter for change or removal of surgical wound dressing: Secondary | ICD-10-CM | POA: Diagnosis not present

## 2018-04-30 DIAGNOSIS — Z48815 Encounter for surgical aftercare following surgery on the digestive system: Secondary | ICD-10-CM | POA: Diagnosis not present

## 2018-04-30 DIAGNOSIS — I5042 Chronic combined systolic (congestive) and diastolic (congestive) heart failure: Secondary | ICD-10-CM | POA: Diagnosis not present

## 2018-04-30 DIAGNOSIS — I255 Ischemic cardiomyopathy: Secondary | ICD-10-CM | POA: Diagnosis not present

## 2018-05-02 ENCOUNTER — Other Ambulatory Visit: Payer: Self-pay | Admitting: Gastroenterology

## 2018-05-20 IMAGING — CR DG CHEST 2V
2 series · 2 of 2 positions shown · non-contrast
Comparison: Chest x-ray of November 13, 2015

CLINICAL DATA: Preoperative study prior to colostomy reversal.
History of coronary artery disease with twos 2 stents placed, asthma
-COPD, former smoker.

EXAM:
CHEST  2 VIEW

[w chest pa]
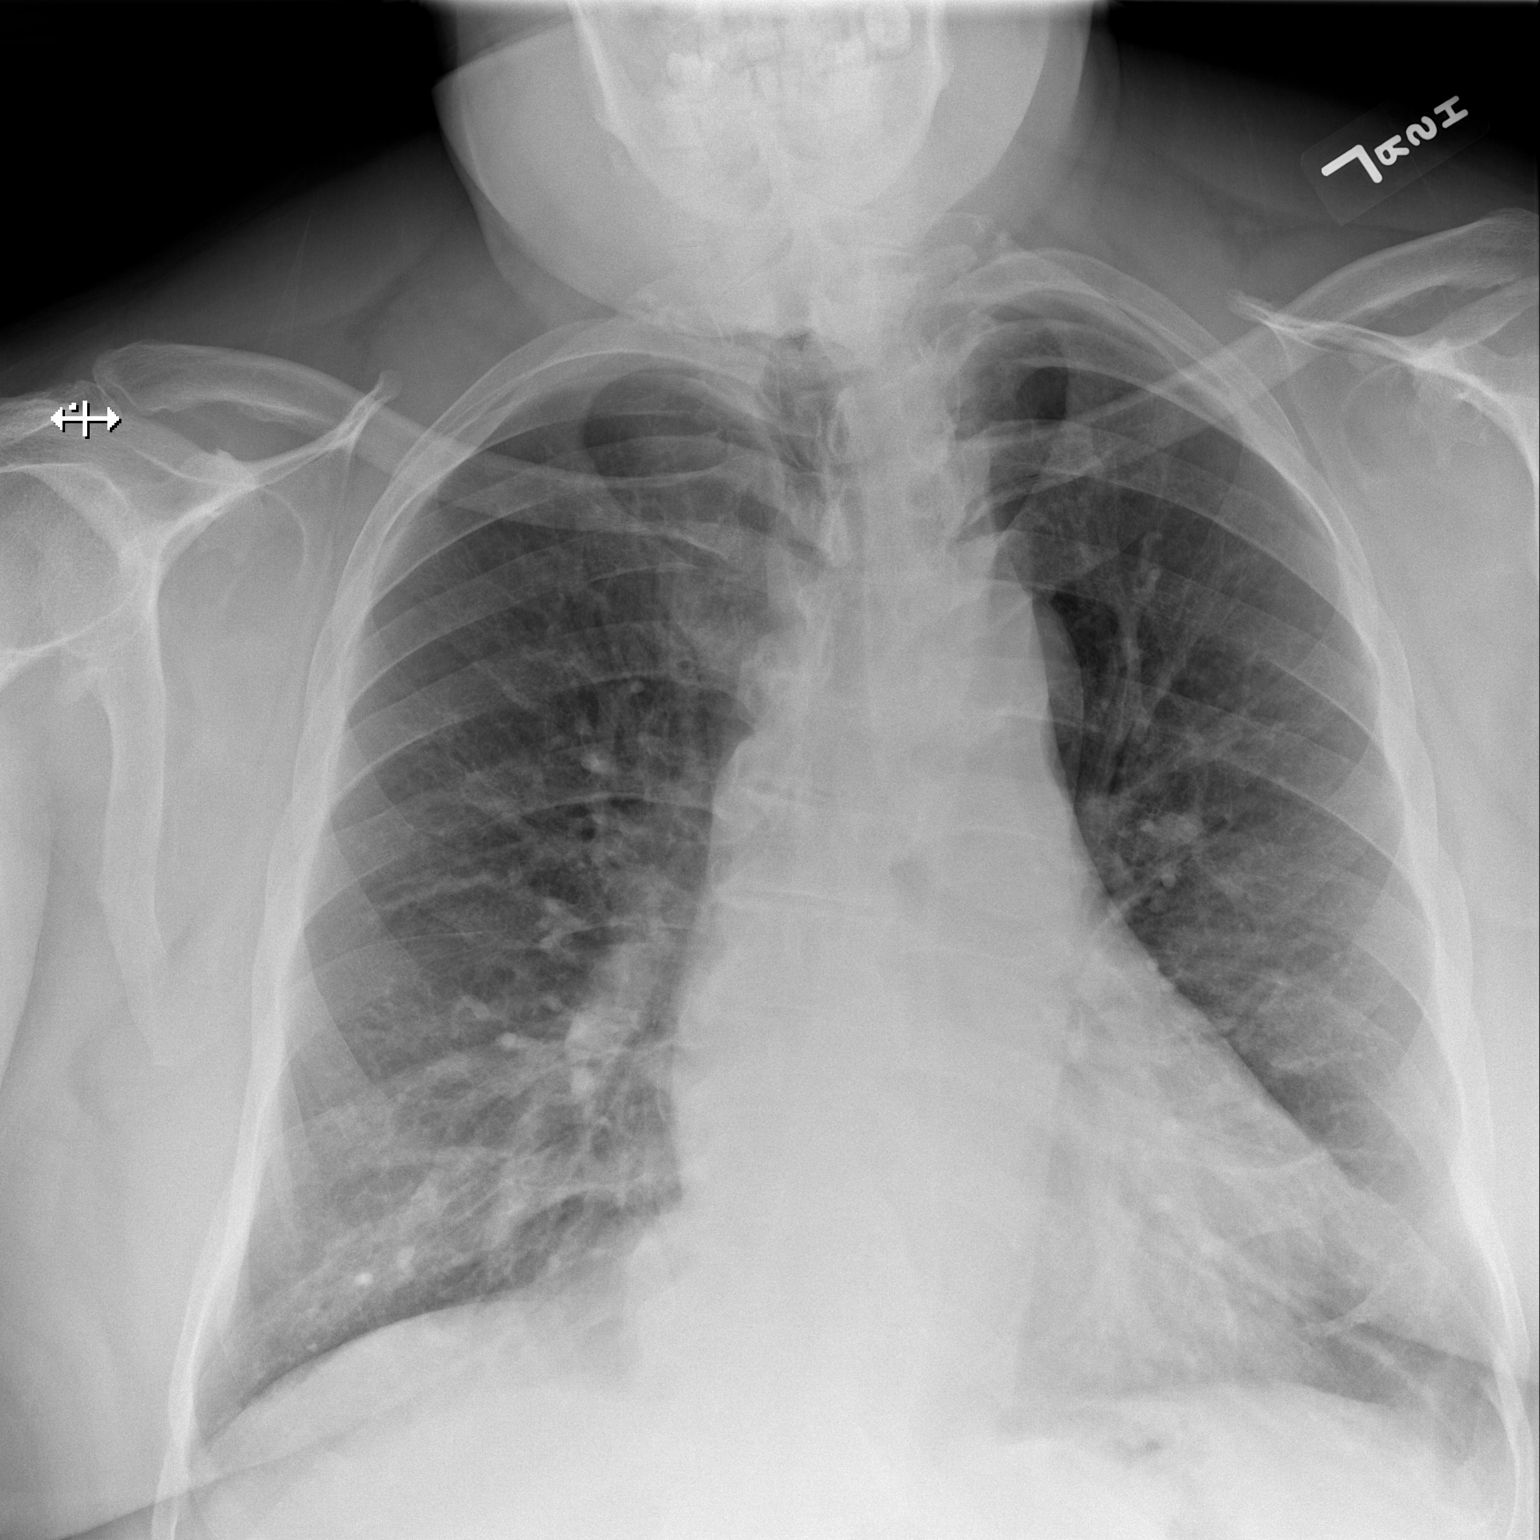

[w chest lat]
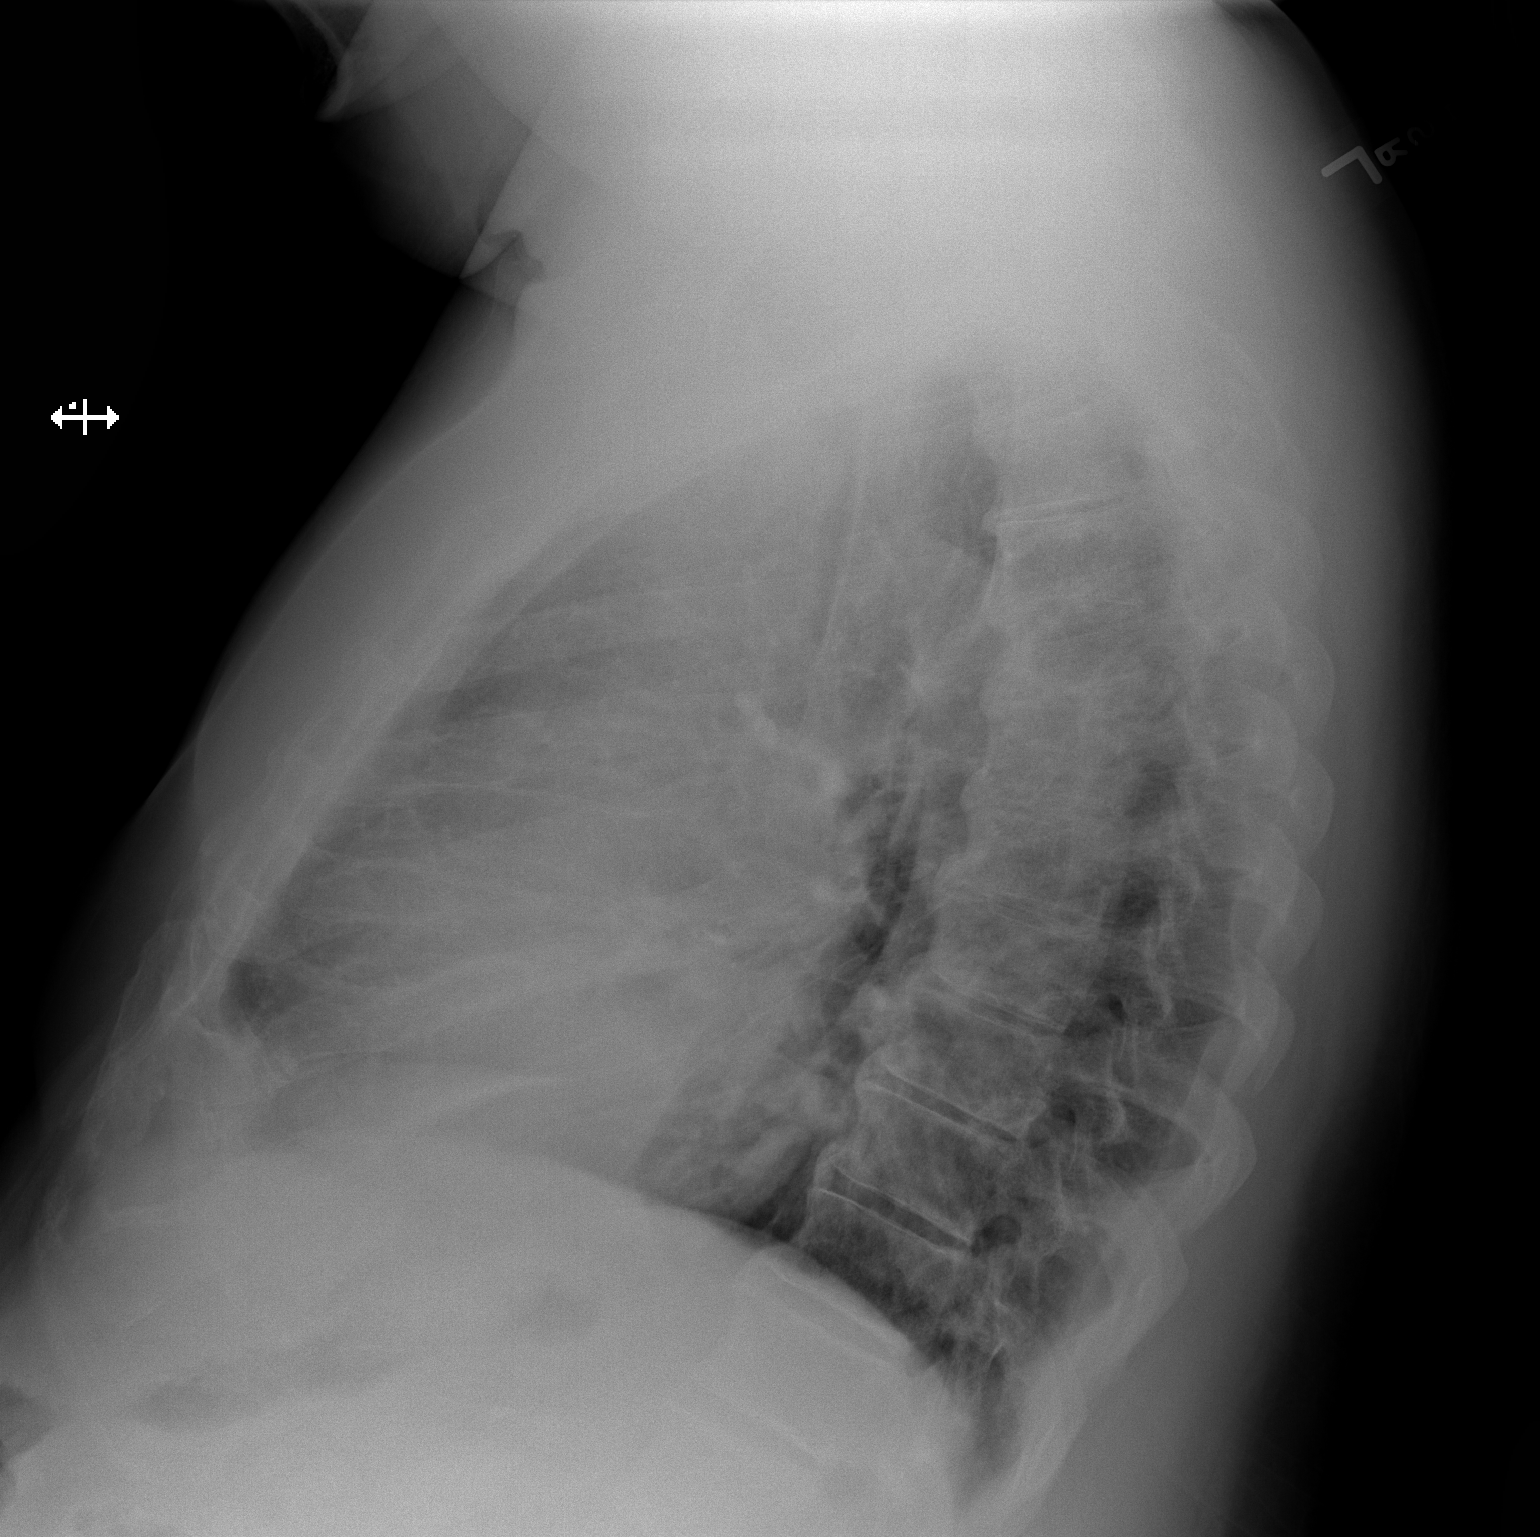

[2 of 2 positions shown; findings below may reference images not displayed]

FINDINGS: The lungs remain hyperinflated. The interstitial markings remain
coarse. There is no alveolar infiltrate or pleural effusion. The
cardiac silhouette is mildly enlarged but stable. The pulmonary
vascularity is not engorged. There is calcification in the wall of
the aortic arch. There is gentle levocurvature centered in the upper
thoracic spine. There is multilevel degenerative disc disease of the
thoracic spine.
IMPRESSION: Chronic bronchitic changes.  No alveolar pneumonia nor CHF.

Thoracic aortic atherosclerosis.

## 2018-06-01 IMAGING — CR DG CHEST 1V PORT
2 series · 2 of 2 positions shown · non-contrast
Comparison: 04/07/2016 CXR

CLINICAL DATA: Right IJ line placement

EXAM:
PORTABLE CHEST 1 VIEW

[AP (1 of 2)]
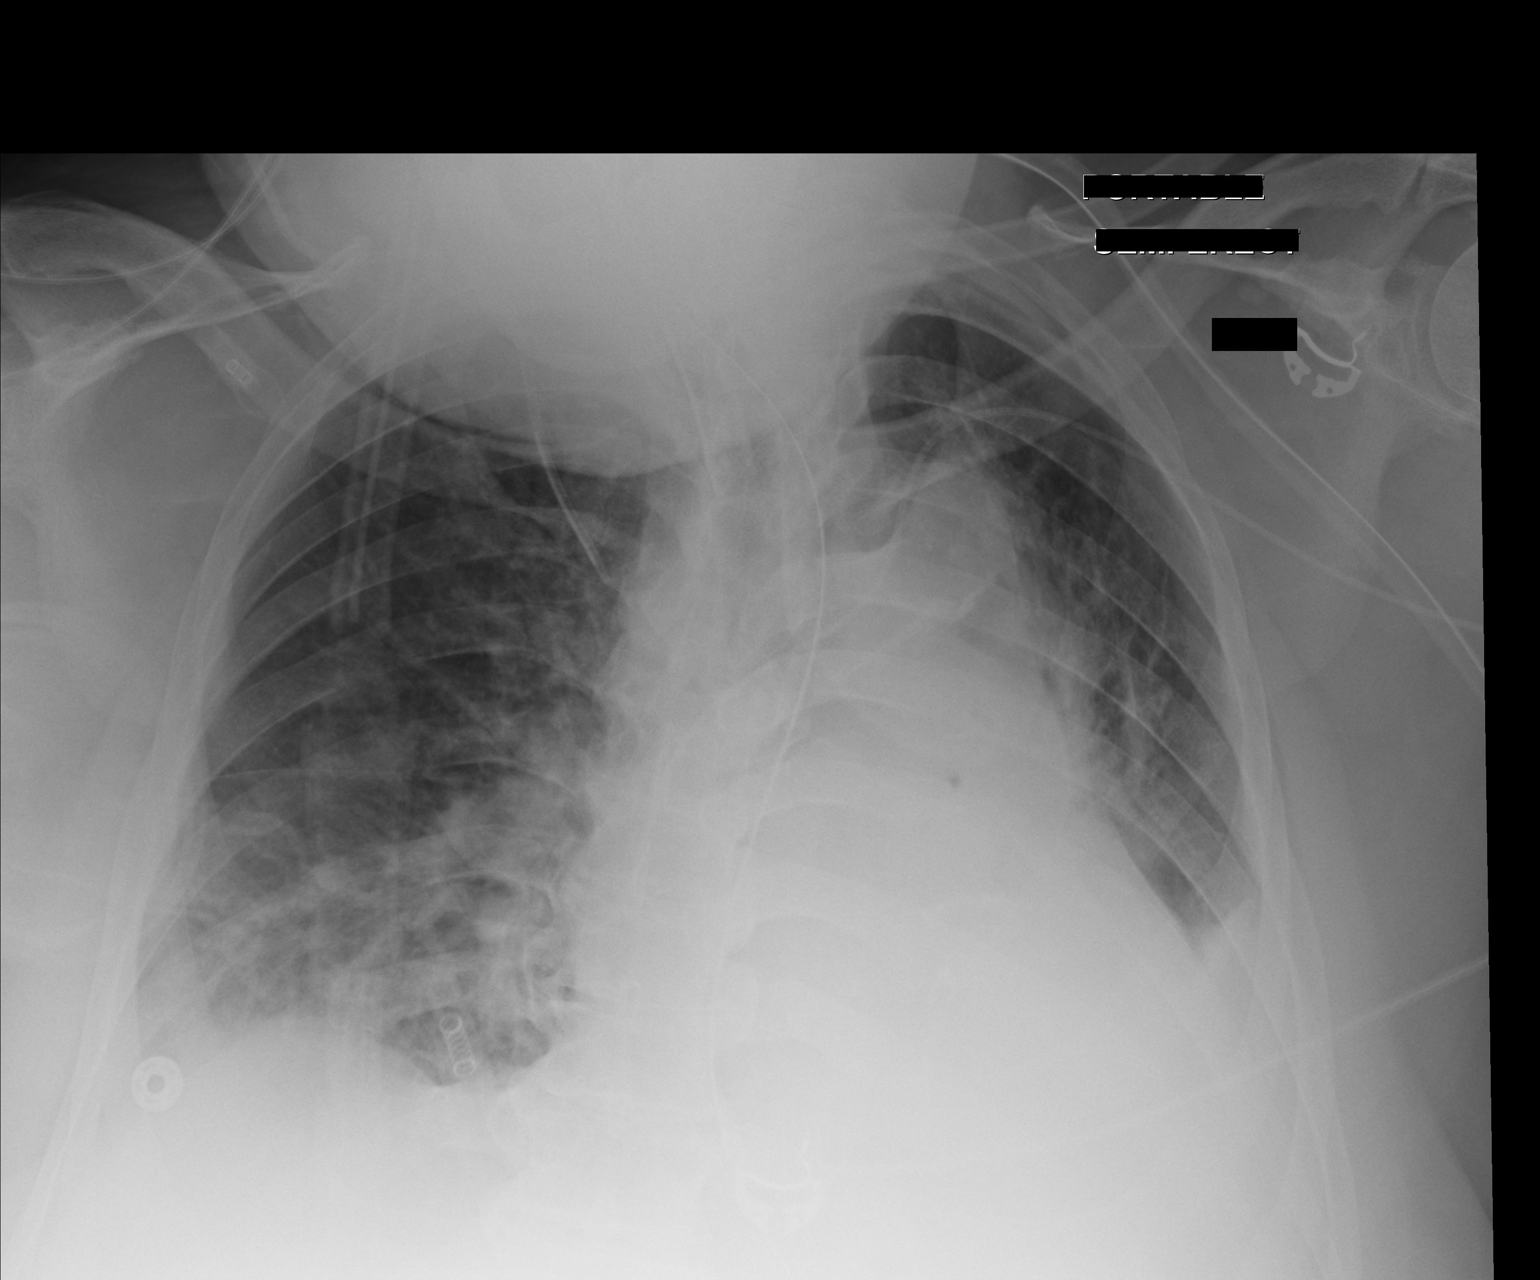

[AP (2 of 2)]
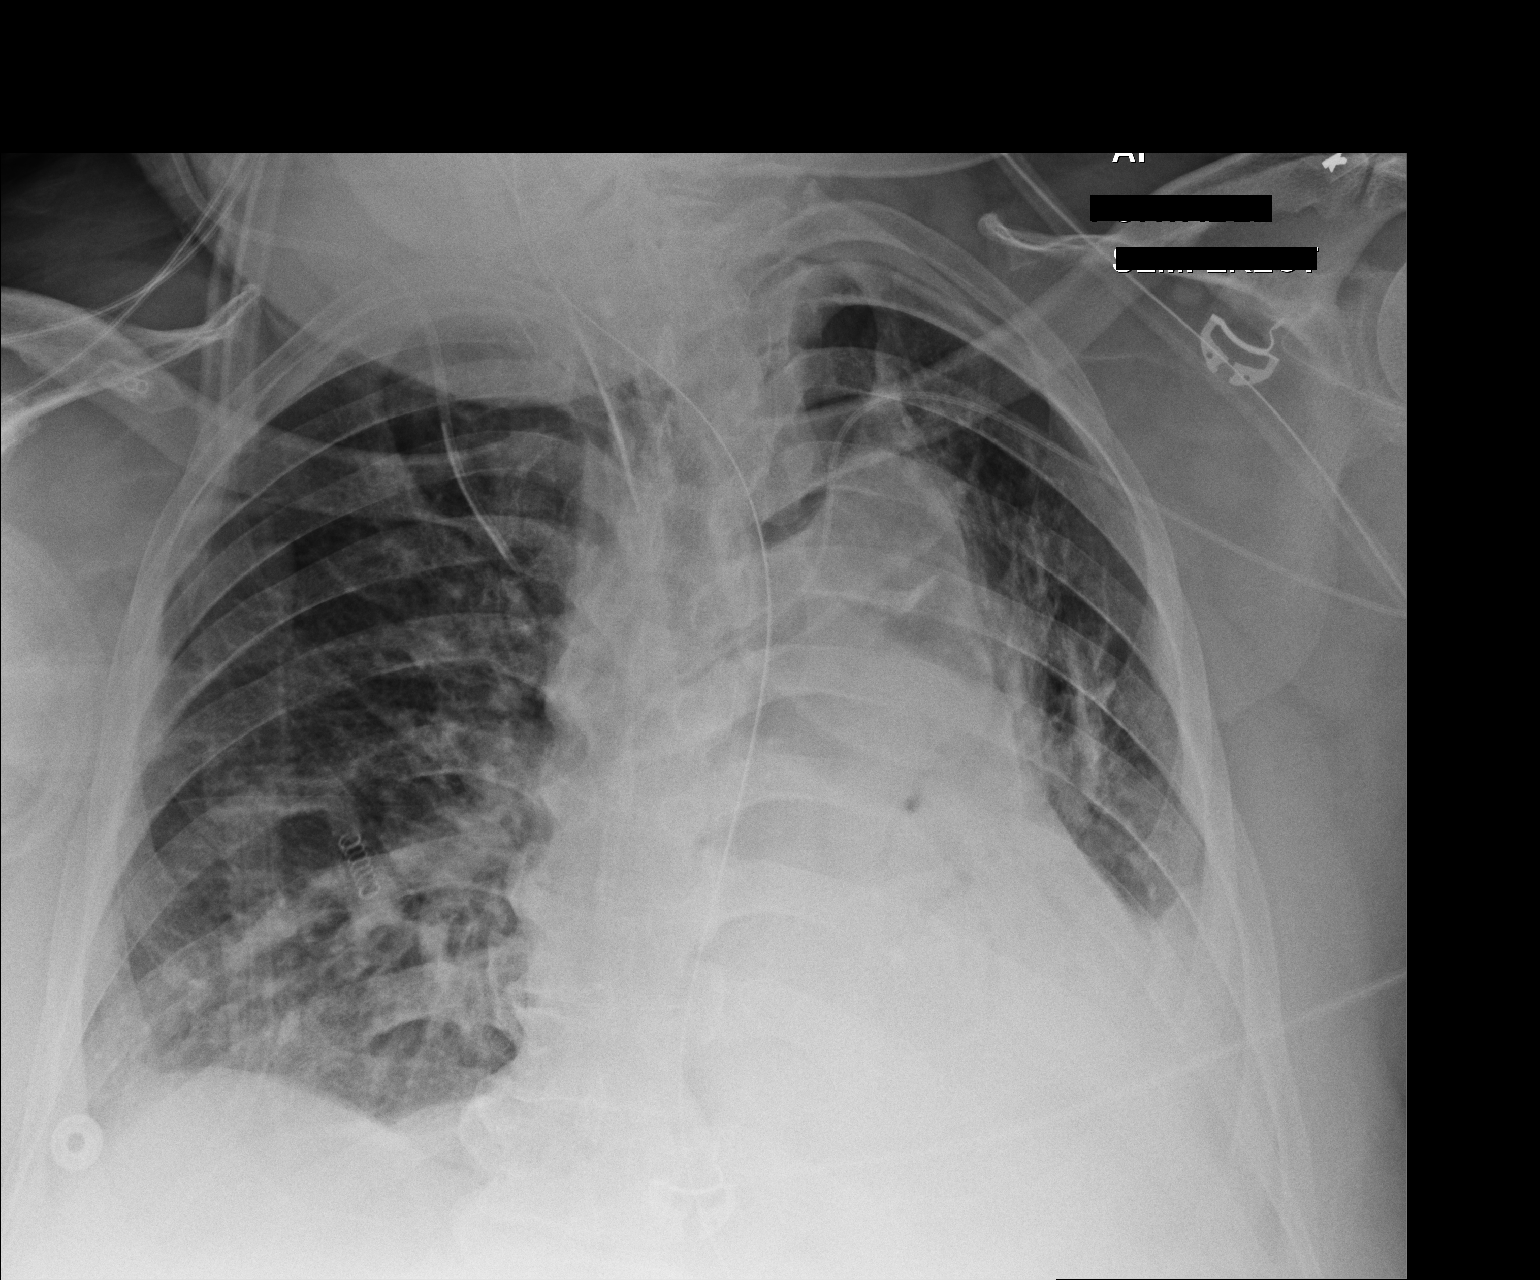

[2 of 2 positions shown; findings below may reference images not displayed]

FINDINGS: Heart is top normal. Patient is slightly rotated with chin obscuring
the apices. Right IJ central line catheter is noted projecting
toward the expected level of the proximal SVC. No pneumothorax.
Small right and small to moderate left pleural effusions are new
with adjacent atelectasis. Mild vascular congestion is noted.
Endotracheal tube tip projects approximately 4.6 cm above the
carina. Gastric tube extends below the left hemidiaphragm although
the tip is not visualized on this study.
IMPRESSION: Tip of right IJ catheter projects over the region of the proximal
SVC. No pneumothorax.

Satisfactory endotracheal tube tip position. Gastric tube extends
below the left hemidiaphragm though tip is not included on this
exam.

Cardiomegaly with small right and small to moderate left pleural
effusions.

## 2018-06-01 IMAGING — DX DG ABD PORTABLE 1V
2 series · 2 of 2 positions shown · non-contrast
Comparison: 04/16/2016 and 04/15/2016 and 02/23/2013

CLINICAL DATA: Abdominal pain. Reversal of colostomy on 04/12/2016.

EXAM:
PORTABLE ABDOMEN - 1 VIEW

[abdomen kub (1 of 2)]
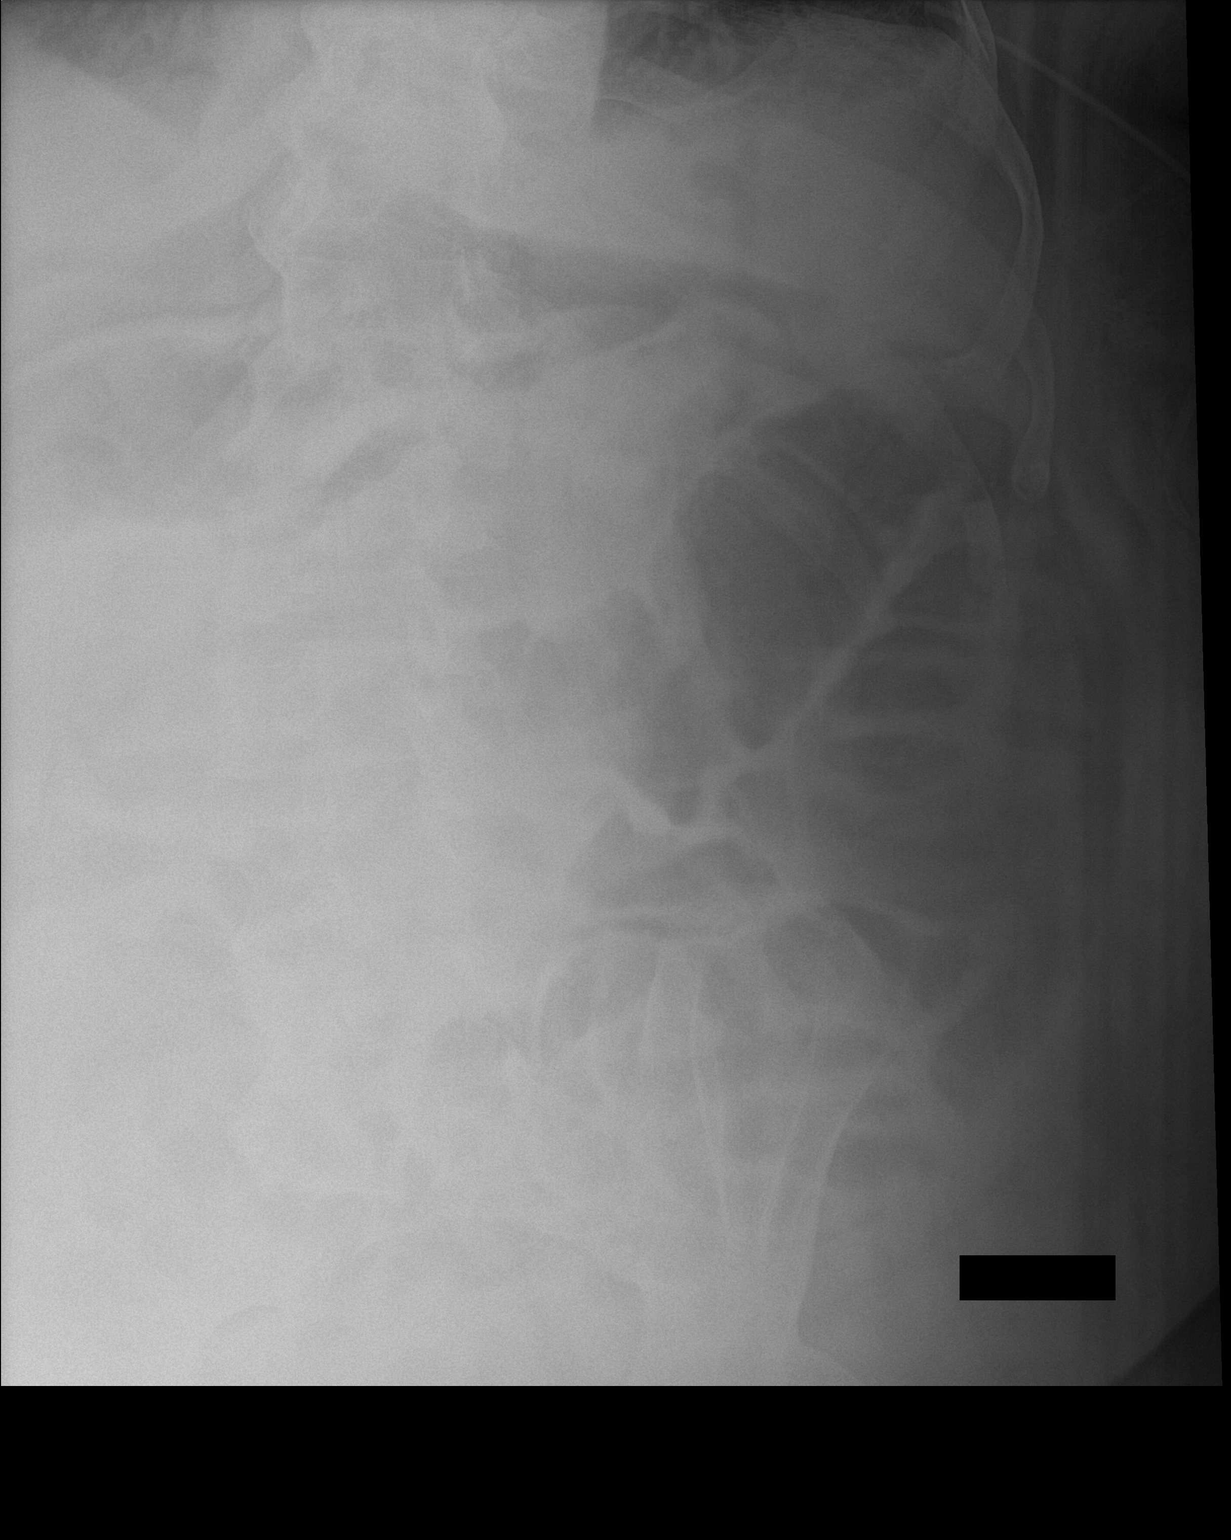

[abdomen kub (2 of 2)]
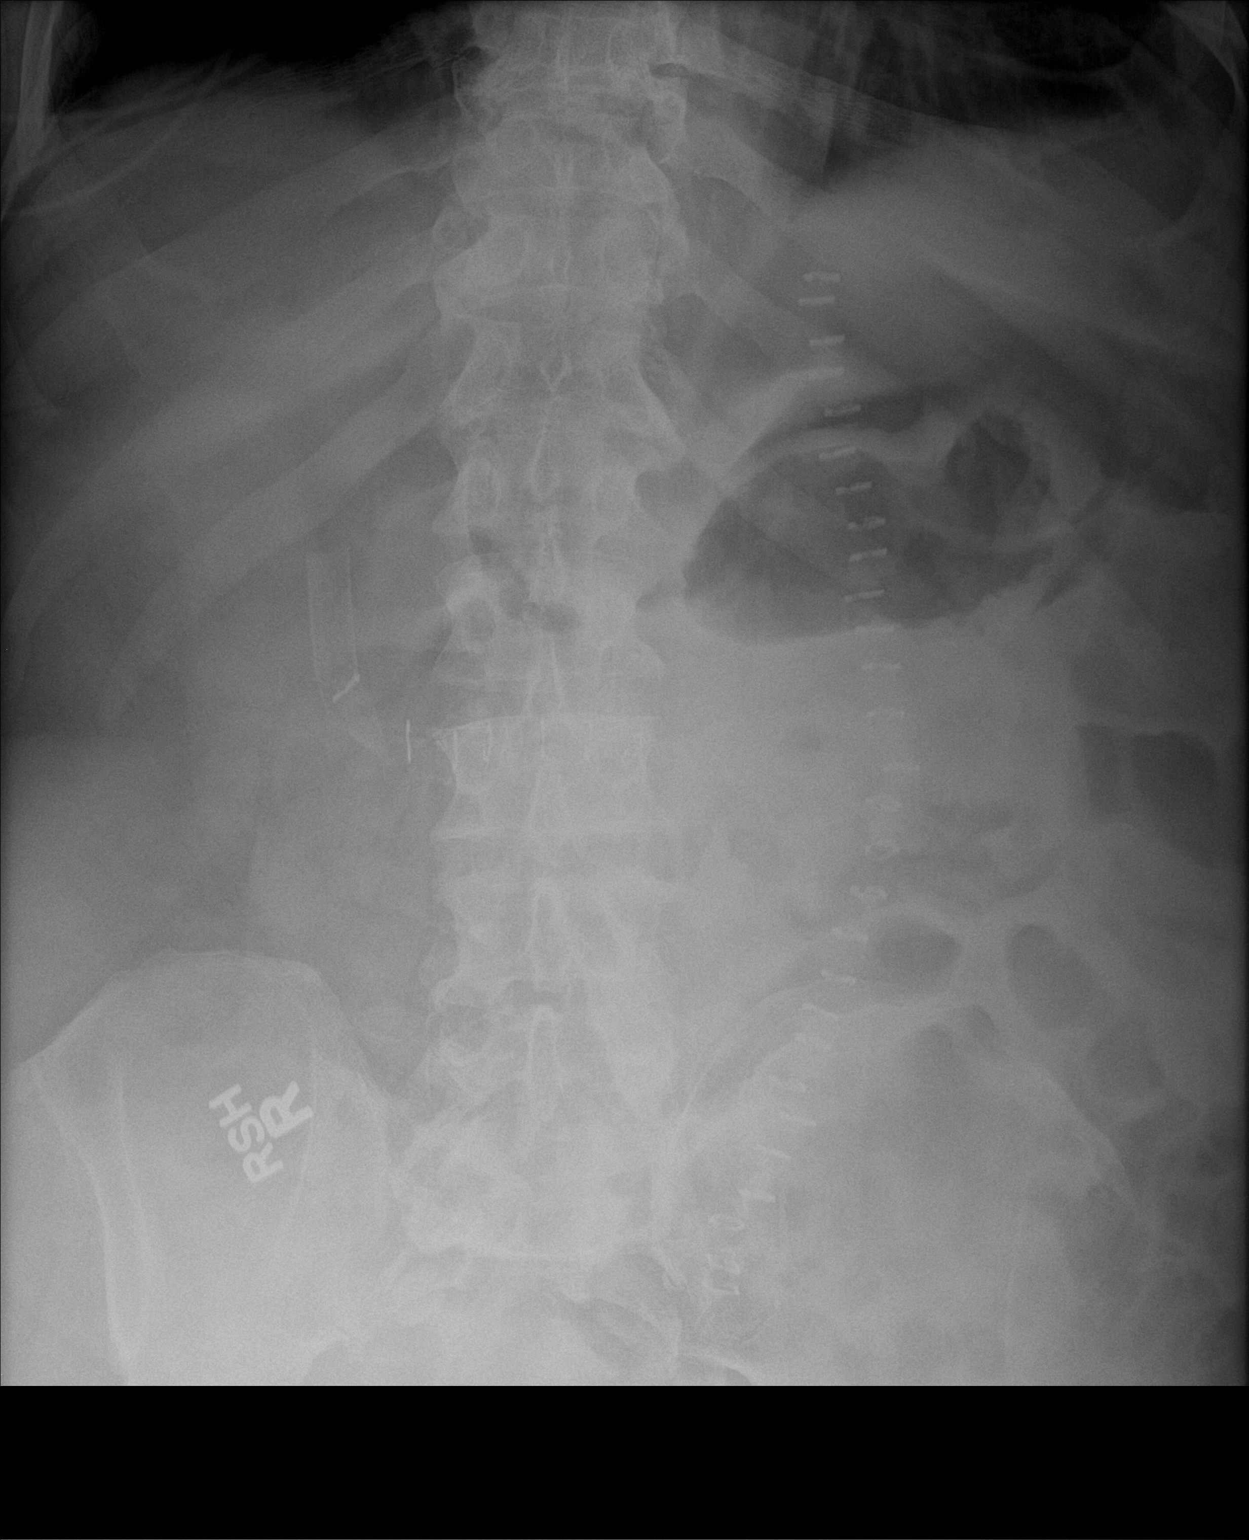

[2 of 2 positions shown; findings below may reference images not displayed]

FINDINGS: NG tube is been removed. Free air in the abdomen. Slightly dilated
small bowel loops in the left mid abdomen persist the stomach is not
distended. Colon is not distended.

Surgical staples and soft tissue drain are noted.
IMPRESSION: Persistent dilatation of small-bowel loops in the left mid abdomen.
Moderate free air in the abdomen.

## 2018-06-08 ENCOUNTER — Ambulatory Visit (HOSPITAL_COMMUNITY): Admission: RE | Admit: 2018-06-08 | Payer: Medicare Other | Source: Home / Self Care | Admitting: Gastroenterology

## 2018-06-08 ENCOUNTER — Encounter (HOSPITAL_COMMUNITY): Admission: RE | Payer: Self-pay | Source: Home / Self Care

## 2018-06-08 SURGERY — COLONOSCOPY WITH PROPOFOL
Anesthesia: Monitor Anesthesia Care

## 2018-06-16 ENCOUNTER — Other Ambulatory Visit: Payer: Self-pay | Admitting: Nurse Practitioner

## 2018-06-19 ENCOUNTER — Other Ambulatory Visit: Payer: Self-pay | Admitting: Nurse Practitioner

## 2018-06-19 DIAGNOSIS — I5022 Chronic systolic (congestive) heart failure: Secondary | ICD-10-CM

## 2018-06-19 DIAGNOSIS — E7800 Pure hypercholesterolemia, unspecified: Secondary | ICD-10-CM

## 2018-06-19 DIAGNOSIS — R0602 Shortness of breath: Secondary | ICD-10-CM

## 2018-06-19 DIAGNOSIS — I259 Chronic ischemic heart disease, unspecified: Secondary | ICD-10-CM

## 2018-06-19 DIAGNOSIS — I471 Supraventricular tachycardia: Secondary | ICD-10-CM

## 2018-06-25 ENCOUNTER — Other Ambulatory Visit: Payer: Self-pay | Admitting: Nurse Practitioner

## 2018-07-11 DIAGNOSIS — T8189XS Other complications of procedures, not elsewhere classified, sequela: Secondary | ICD-10-CM | POA: Diagnosis not present

## 2018-08-02 ENCOUNTER — Other Ambulatory Visit: Payer: Self-pay | Admitting: Nurse Practitioner

## 2018-08-08 DIAGNOSIS — T8189XS Other complications of procedures, not elsewhere classified, sequela: Secondary | ICD-10-CM | POA: Diagnosis not present

## 2018-08-08 DIAGNOSIS — L7682 Other postprocedural complications of skin and subcutaneous tissue: Secondary | ICD-10-CM | POA: Diagnosis not present

## 2018-09-09 ENCOUNTER — Other Ambulatory Visit: Payer: Self-pay | Admitting: Nurse Practitioner

## 2018-09-10 ENCOUNTER — Telehealth: Payer: Self-pay | Admitting: Nurse Practitioner

## 2018-09-10 NOTE — Progress Notes (Signed)
CARDIOLOGY OFFICE NOTE  Date:  09/11/2018    Larry [name] Date of Birth: 1950/06/10 Medical Record #[id number]  PCP:  Larry Landsman, MD  Cardiologist:  Larry [name] & Larry [name]  Chief Complaint  Patient presents with   Follow-up    Seen for Dr. Rayann [name]    History of Present Illness: Larry [name] is a 68 y.o. male who presents today for a follow up visit. Seen for Dr. Rayann [name].  He has an ischemic CM with severe diffuse 3VD and prior stents to the RCA in 7026, systolic and diastolic dysfunction, HTN, HLD and obesity. Has had a history of long standing atrial tach that has been treated with beta blockerand CCB therapy. Was hospitalized back in April of 2013 with a HF exacerbation - had stopped his medicines. Last EF of 40 - to 37% with diastolic dysfunction as well noted in January of 2015.   Seen by me back in October of 2014 - Was out of his medicines again. Had lost weight and was anemic - I sent him to GI and he ended up having colon cancer.   He was admitted (02/02/2013 - 03/01/2013) to [facility] with subacute history of progressive weakness, constipation and difficulty eating and drinking. He presented with acute renal failure (Creatinine 7.83) and severe anemia (Hbg of 7.3) as well as obstruction. Colonoscopy doing this admission with biopsy revealed adenocarcinoma and patient underwent exploratory lap with partial colectomy with colostomy on 12/23 by Dr. Hulen [name]. Recovery was complicated by wound dehissance, requiring a return to the OR 12/29 with abdominal wound closure with VAC placment. After the procedure, the patient developed atrial tachycardia, so a dilt drip was started (12/29-1/2). On 1/4, the patient had a cardiac arrest, requiring defibrillation, and was subsequently transferred to the ICU. QT was [name] to be >700, with mild hypokalemia and hypomagnesemia implicated as the cause. Lidocaine drip was started. Electrolyte abnormality treated and 2D-echo  revealed an EF of 40-45% with periapical akinesis and biatrial enlargement. He experienced recurrent torsades on 01/05 and shocked in NSR. By 1/6, QTc had decreased to 499, lidocaine drip was d/c'ed, and patient was transferred out of ICU to stepdown. Then admitted to physical medicine and rehabilitation on 01/09 due to severe deconditioning. He was continued on protein supplement to promote wound healing. His abdominal wound was treated with VAC through 03/05/13. His heart rate was controlled without recurrent arrythmia and was maintained on potassium and magnesium supplements. He was discharged to home on 03/07/2013. He was last evaluated by Dr. Hulen [name] on 02/03 with review of his pathology demonstrating invasive adenocarcinoma of the transverse colon with penetration into the pericolonic fat. He had 0/18 lymph nodes negative for metastatic disease. He was pathologic stage [id number].   Saw Dr. Gwenlyn [name] in early July of 2015 for a foot ulcer - basically normal ABI'snoted at that time.  He was readmitted to the hospital in August of 2015 - triggered by too much salt. Presented with respiratory distress - treated with Bipap and quickly stabilized. Was diuresed about 6 liters as well. Started on low dose ACE. Sleep study recommended.  I have seen back here several times since that hospitalizationand have basically followed him since-he got referred for a sleep study and is on CPAP -in March of 2016 - wanting to get his colostomy reversed and needing pre op clearance. Had a Myoview to risk stratify - this was high risk and I referred him on for cardiac cath with Dr. Burt [name]. Underwent  PCI to the LAD with DES x 2. Committed to DAPT for one year ideally but left on indefinitely. He had to wait on colostomy reversal due to being on DAPT.  Seen at the cancer center back in [facility] 2017-HR elevated - asked to follow up here - I increased his Coreg further. He ended up going in to the hospital for  aweekend - still tachycardic - switched over to daily dosing of Verapamil and changed Coreg to Lopressor - looks like his ACE was stopped - pulmonary wanted him on ARB which was to be considered as an outpatient. Seen by pulmonary for a cough which led to a CT scan. HR in the 130's the whole time while in the ER. I then saw him back like 3 days later - he was feeling fine. Not short of breath. No more cough.   InNovember of 2017 - was having his tests done for planned reversal of his colostomy. Colostomy reversal occurred in February 2018 -thisdid not go well - had an anastomotic leakon POD 7 - ended up with an ileostomy and required a prolongedstay at [facility] respiratory failure. He was seen by cardiology forintermittentatrialtachycardia.He was able to be discharged from [facility].   I have seen him several times since. I have referred him back to Dr. Rayann [name] to review his medical therapy and to make sure we were all on track with the current plan of care. Has chronic and massive lower extremity edema - options are very limited and CCB will have to be continued.   He was admitted in May of 2019- had sepsis from a UTI. His medicines were cut back again. We have had several phone calls from Home health about his elevated HRafter discharge-got himback on his usual dose of CCB therapy and beta blocker.   He underwent GU proceduresin Juneof 2019 - despite being athigh risk for any procedures going forward from our standpoint.He did ok.I then saw him at the later part of June - he was doing ok - still no sensation of any palpitations or his heart rate. Remains pretty sedentary.Last visit was back in January - he had gained weight - more sedentary - HR was ok.   The patient does not have symptoms concerning for COVID-19 infection (fever, chills, cough, or new shortness of breath).   Comes in today. Here alone. This is his first doctor's visit since the pandemic started. He says he  is doing ok. Staying home - very sedentary - weight is up more. His swelling is unchanged. He keeps telling me "I'm ok". He has been sent to the wound clinic by Dr. Grandville Silos - he has a place from his ileostomy that keeps "popping open".    Past Medical History:  Diagnosis Date   Arthritis    Atrial tachycardia ([place])    managed on beta blocker therapy   Childhood asthma    "went away after I was 16"   Chronic combined systolic and diastolic CHF, NYHA class 3 (HCC)    has diastolic heart failure grade 1; EF is 45 to 50% per echo 05/2011; EF 41% by Myoview 2016   CKD (chronic kidney disease) stage 3, GFR 30-59 ml/min (HCC)    Colon cancer ([place]) 2015   MSI high; IHC loss of MLH1 and PMS2; BRAF negative; Negative methylation   COPD (chronic obstructive pulmonary disease) (HCC)    Coronary artery disease    Family history of ovarian cancer    Hypercholesteremia    Hypertension  Noncompliance    NSVT (nonsustained ventricular tachycardia) (HCC)    beta blocker restarted   Obesity    OSA on CPAP    used nightly, pt does not know settings   Peripheral arterial disease (HCC)    4.2 cm thoracic aortic aneurysm per chest ct 11-14-15 epic   S/P colostomy ([place])    2014   Thrombocytopenia ([place])    Torsades de pointes ([place])    X 2 episodes during hospital visit 12'14"electrolyte imbalance"- "Shocked"    Past Surgical History:  Procedure Laterality Date   COLON SURGERY     COLONOSCOPY N/A 02/08/2013   Procedure: COLONOSCOPY;  Surgeon: [name], MD;  Location: [employer] [facility];  Service: Endoscopy;  Laterality: N/A;   COLONOSCOPY WITH PROPOFOL N/A 05/09/2014   Procedure: COLONOSCOPY WITH PROPOFOL;  Surgeon: [name], MD;  Location: [facility];  Service: Endoscopy;  Laterality: N/A;   COLONOSCOPY WITH PROPOFOL N/A 01/08/2016   Procedure: COLONOSCOPY WITH PROPOFOL;  Surgeon: [name], MD;  Location: [facility];  Service: Endoscopy;  Laterality: N/A;    COLOSTOMY N/A 04/19/2016   Procedure: COLOSTOMY;  Surgeon: Judeth Horn, MD;  Location: [place];  Service: General;  Laterality: N/A;   COLOSTOMY REVERSAL  04/12/2016   COLOSTOMY REVERSAL N/A 04/12/2016   Procedure: COLOSTOMY REVERSAL;  Surgeon: Judeth Horn, MD;  Location: [place];  Service: General;  Laterality: N/A;   CORONARY ANGIOPLASTY WITH STENT PLACEMENT  11/12/2008; 06/11/2014   stent x 2 to RCA; stent x 2 to LAD   CORONARY ANGIOPLASTY WITH STENT PLACEMENT  06/11/2014   m-LAD 3.5 x 16 mm Synergy DES, d-LAD  2.25 x 16 mm Synergy DES   CYSTOSCOPY W/ URETERAL STENT PLACEMENT Left 06/30/2017   Procedure: CYSTOSCOPY WITH RETROGRADE PYELOGRAM/URETERAL STENT PLACEMENT;  Surgeon: Lucas Mallow, MD;  Location: [place];  Service: Urology;  Laterality: Left;   CYSTOSCOPY WITH LITHOLAPAXY N/A 08/02/2017   Procedure: CYSTOSCOPY WITH BLADDER STONE EXTRACTION;  Surgeon: Lucas Mallow, MD;  Location: [facility];  Service: Urology;  Laterality: N/A;   CYSTOSCOPY WITH RETROGRADE PYELOGRAM, URETEROSCOPY AND STENT PLACEMENT Left 08/02/2017   Procedure: CYSTOSCOPY WITH LEFT RETROGRADE PYELOGRAM, URETEROSCOPY AND STENT EXCHANGE;  Surgeon: Lucas Mallow, MD;  Location: [facility];  Service: Urology;  Laterality: Left;   ESOPHAGOGASTRODUODENOSCOPY N/A 02/08/2013   Procedure: ESOPHAGOGASTRODUODENOSCOPY (EGD);  Surgeon: [name], MD;  [facility];  Service: Endoscopy;  Laterality: N/A;   FLEXIBLE SIGMOIDOSCOPY N/A 02/19/2016   Procedure: FLEXIBLE SIGMOIDOSCOPY;  Surgeon: [name], MD;  [facility];  Service: Endoscopy;  Laterality: N/A;   HOLMIUM LASER APPLICATION Left 3/49/1791   Procedure: HOLMIUM LASER APPLICATION;  Surgeon: Lucas Mallow, MD;  Location: [facility];  Service: Urology;  Laterality: Left;   LAPAROTOMY N/A 02/12/2013   Procedure: EXPLORATORY LAPAROTOMY PARTIAL COLECTOMY WITH COLOSTOMY;  Surgeon: [name], MD;  Location: [place];  Service: General;   Laterality: N/A;   LAPAROTOMY N/A 02/18/2013   Procedure: EXPLORATORY LAPAROTOMY/Closure of Wound;  Surgeon: [name], MD;  Location: [place];  Service: General;  Laterality: N/A;   LAPAROTOMY N/A 04/19/2016   Procedure: EXPLORATORY LAPAROTOMY, REPAIR OF ANASTAMOTIC LEAK;  Surgeon: Judeth Horn, MD;  Location: [place];  Service: General;  Laterality: N/A;   LEFT HEART CATHETERIZATION WITH CORONARY ANGIOGRAM N/A 06/11/2014   Procedure: LEFT HEART CATHETERIZATION WITH CORONARY ANGIOGRAM;  Surgeon: [name], MD; CFX calcified, 30-40 percent, RCA calcified, 40/50/40%, PDA diffuse disease, LAD 40/75/90%  s/p DES 2      Medications: Current Meds  Medication Sig   aspirin EC 81 MG tablet Take 1 tablet (81 mg total) by mouth daily.   atorvastatin (LIPITOR) 40 MG tablet TAKE 1 TABLET BY MOUTH EVERY DAY   clopidogrel (PLAVIX) 75 MG tablet Take 75 mg by mouth daily.   diltiazem (CARDIZEM) 60 MG tablet TAKE 1.5 TABLETS (90 MG TOTAL) BY MOUTH 3 (THREE) TIMES DAILY.   diltiazem (CARDIZEM) 90 MG tablet Take 1 tablet (90 mg total) by mouth 3 (three) times daily.   ferrous sulfate 325 (65 FE) MG tablet Take 1 tablet (325 mg total) by mouth daily with breakfast.   furosemide (LASIX) 40 MG tablet TAKE 1.5 TABLETS (60 MG TOTAL) BY MOUTH DAILY.   levalbuterol (XOPENEX HFA) 45 MCG/ACT inhaler INHALE 2 PUFFS BY MOUTH EVERY 4 HOURS AS NEEDED FOR WHEEZE   metoprolol tartrate (LOPRESSOR) 25 MG tablet TAKE 1.5 TABLETS (37.5 MG TOTAL) BY MOUTH 2 (TWO) TIMES DAILY.   Multiple Vitamins-Minerals (CENTRUM SILVER 50+MEN PO) Take 1 tablet by mouth daily.   nitroGLYCERIN (NITROSTAT) 0.4 MG SL tablet Place 0.4 mg under the tongue every 5 (five) minutes as needed for chest pain.   simethicone (GAS-X EXTRA STRENGTH) 125 MG chewable tablet Chew 125 mg by mouth 3 (three) times daily.   spironolactone (ALDACTONE) 25 MG tablet TAKE 0.5 TABLETS (12.5 MG TOTAL) BY MOUTH DAILY.   tamsulosin (FLOMAX) 0.4 MG CAPS  capsule Take 1 capsule (0.4 mg total) by mouth daily.     Allergies: No Known Allergies  Social History: The patient  reports that he quit smoking about 10 years ago. His smoking use included cigarettes. He has a 38.00 pack-year smoking history. He has never used smokeless tobacco. He reports current alcohol use. He reports that he does not use drugs.   Family History: The patient's family history includes Dementia in his mother; Diabetes in his father; Heart disease in his father; Hypertension in his father and mother; Leukemia in his maternal uncle; Liver cancer in his maternal grandmother; Parkinsonism in his mother; Prostate cancer in his maternal uncle.   Review of Systems: Please see the history of present illness.   All other systems are reviewed and negative.   Physical Exam: VS:  BP 130/80 (BP Location: Left Arm, Patient Position: Sitting, Cuff Size: Large)    Pulse 95    Ht _0  (1.803 m)    Wt (!) 357 lb 12.8 oz (162.3 kg)    SpO2 98% Comment: at rest   BMI 49.90 kg/m  .  BMI Body mass index is 49.9 kg/m.  Wt Readings from Last 3 Encounters:  09/11/18 (!) 357 lb 12.8 oz (162.3 kg)  03/07/18 (!) 353 lb (160.1 kg)  11/15/17 (!) 339 lb (153.8 kg)    General: Pleasant. Obese black male. In no acute distress.  HEENT: Normal.  Neck: Supple, no JVD, carotid bruits, or masses noted.  Cardiac: Heart tones are distant. HR little fast. Massive lower extremity edema.  Respiratory:  Lungs are clear to auscultation bilaterally with normal work of breathing.  GI: Soft and nontender.  MS: No deformity or atrophy. Gait and ROM intact.  Skin: Warm and dry. Color is normal.  Neuro:  Strength and sensation are intact and no gross focal deficits noted.  Psych: Alert, appropriate and with normal affect.   LABORATORY DATA:  EKG:  EKG is not ordered today.   Lab Results  Component Value Date   WBC  5.8 03/07/2018   HGB 12.9 (L) 03/07/2018   HCT 38.8 03/07/2018   PLT 141 (L)  03/07/2018   GLUCOSE 81 03/07/2018   CHOL 165 03/07/2018   TRIG 202 (H) 03/07/2018   HDL 56 03/07/2018   LDLCALC 69 03/07/2018   ALT 16 03/07/2018   AST 13 03/07/2018   NA 142 03/07/2018   K 4.0 03/07/2018   CL 106 03/07/2018   CREATININE 2.17 (H) 03/07/2018   BUN 31 (H) 03/07/2018   CO2 20 03/07/2018   TSH 1.080 05/14/2016   INR 1.31 06/30/2017   HGBA1C 5.9 (H) 05/14/2016     BNP (last 3 results) No results for input(s): BNP in the last 8760 hours.  ProBNP (last 3 results) No results for input(s): PROBNP in the last 8760 hours.   Other Studies Reviewed Today:  EchoStudy Conclusions2/2018  - Left ventricle: The cavity size was normal. Wall thickness was increased in a pattern of moderate LVH. Systolic function was normal. The estimated ejection fraction was in the range of 55% to 60%. Wall motion was normal; there were no regional wall motion abnormalities. The study is not technically sufficient to allow evaluation of LV diastolic function. - Aortic root: The aortic root was mildly dilated. - Mitral valve: Calcified annulus.  Impressions:  - Technically difficult; definity used; normal LV function; moderate LVH.   MyoviewStudy Highlights12/2017   Nuclear stress EF: 58%.  There was no ST segment deviation noted during stress.  There is a large defect of severe severity present in the basal inferior, mid inferior, apical septal, apical inferior and apex location. The defect is non-reversible. No ischemia noted. Tthis is consistent with diaphragmatic attenuation artifact vs. Scar.  This is a low risk study.  The left ventricular ejection fraction is normal (55-65%).     Procedure: Left Heart Cath, [facility]ive Coronary Angiography, PTCA and stenting of the mid and distal LAD April 2016.  Coronary angiography: Left mainstem: The left mainstem is patent. The vessel divides into the LAD and left circumflex. There is no  significant stenosis, but there is mild irregularity.  Left anterior descending (LAD): The LAD is moderately calcified. The vessel is severely diseased. The proximal LAD is patent with 30-40% stenosis after the second septal perforator. There is diffuse calcification. The diagonal branches are small. The mid LAD has an eccentric 75% stenosis at the origin of the second diagonal branch. Beyond that area the mid and distal LAD are diffusely diseased. There is another severe stenosis in the apical LAD of 90%.  Left circumflex (LCx): The left circumflex is calcified. The vessel is patent with mild irregularity. There are no high-grade stenoses identified. There are scattered 30-40% stenoses throughout the proximal and mid circumflex as well as the first OM branch.  Right coronary artery (RCA): The RCA is dominant. The vessel is heavily calcified. The stented segments in the mid and distal RCA are patent. The proximal vessel has 30-40% stenosis. The mid vessel has 30-40% stenosis. The distal vessel is patent with 50-60% stenosis involving the origin of the PDA. The PDA is diffusely diseased.  Left ventriculography: Deferred because of chronic kidney disease. By nuclear scan the LVEF is 41%.  PCI Note: Following the diagnostic procedure, the decision was made to proceed with PCI of the mid and apical LAD. The right coronary artery had patent stents in the left circumflex had nonobstructive disease. The LAD is a diffusely diseased vessel and the mid and distal vessel would be a poor surgical targets.  I felt that PCI was the best option in this patient with a high risk nuclear scan demonstrating anteroapical and anterolateral ischemia. He will require colostomy reversal, so I planned on treating him with Synergy drug-eluting stents which had a biodegradable polymer and potentially can allow for earlier interruption of dual antiplatelet therapy. The patient was loaded with Plavix 600 mg. Weight-based  bivalirudin was given for anticoagulation. Once a therapeutic ACT was achieved, a 6 [place] XB LAD 3.5 cm guide catheter was inserted. A cougar coronary guidewire was used to cross the lesion in the apical LAD. The lesion was predilated with a 2.5 x 12 mm balloon. The same balloon was used to dilate the mid lesion. The apical lesion was then stented with a 2.25 x 16 mm Synergy DES. The stent was postdilated with a 2.5 mm noncompliant balloon. The mid lesion was then stented with a 3.5 x 16 mm Synergy DES. That stent was postdilated with a 3.75 mm noncompliant balloon. Following PCI, there was 0% residual stenosis and TIMI-3 flow. Final angiography confirmed an excellent result. The patient tolerated the procedure well. There were no immediate procedural complications. A TR band was used for radial hemostasis. The patient was transferred to the post catheterization recovery area for further monitoring.  PCI Data: Lesion 1: Vessel - LAD/Segment - distal/apical Percent Stenosis (pre) 90 TIMI-flow 3 Stent 2.25 x 16 mm Synergy DES Percent Stenosis (post) 0 TIMI-flow (post) 3  Lesion 2: Vessel - LAD/Segment - mid Percent Stenosis (pre) 75 TIMI-flow 3 Stent 3.5 x 16 mm Synergy DES Percent Stenosis (post) 0 TIMI-flow (post) 3  Estimated Blood Loss: Minimal  Final Conclusions:  1. Two-vessel coronary artery disease with continued patency of the stented segments in the right coronary artery and severe stenoses of the mid and apical LAD 2. Mild diffuse nonobstructive left circumflex stenosis 3. Successful PCI of the LAD using 2 drug-eluting stents   Recommendations:  Dual antiplatelet therapy for at least 6 months. Would be reasonable to consider interruption of aspirin and Plavix at 6 months for colostomy reversal.  [name] MD, [facility] 06/11/2014, 10:06 AM   Assessment / Plan:  1.Intermitted atrial tach -this has been chronic for many years - he has never been  symptomatic - he is managed medically. This is unchanged. I have had Dr. Rayann [name] re-evaluate - he is not a good candidate for any EP procedures or AAD therapies - he is managed conservatively.HR remains ok. He remains without any symptoms.   2.CAD with prior PCI to the RCA - S/P cath with PCI to the LAD back in April of 2016 following high risk Myoview - has had DES x 2 to the LADin 2016-he remainson chronic DAPT therapy - his lastMyoview from 01/2016 was low risk.He has no active chest pain - would favor conservative management. CV risk factor modification is challenging at best - the pandemic has only made this worse. Very sad situation.   3.History of combined systolic and diastolic HF -lastecho from 2018with normal EFbut also a very technically difficult study. Weight continues to increase. He keeps telling me he is ok. Not bothered by dyspnea - always with massive edema that there is nothing much to do about.   4. Colon cancer - followed by oncology - had attempt at colostomy reversal now with ileostomy in place.This will be long term.Going to the wound clinic.   5. HTN -BP is ok. No changes made today. Lab today.   6. CKD - lab today  7. OSA - now on CPAP and followed by Dr. Annamaria Boots- using sporadically.Not discussed today.   8. HLD - on statin therapy- lab today  9. Prior GU procedures -he has been released from GU - I have refilled his Flomax last year to avoid urinary retention with potential for recurrent UTI, then possible recurrent sepsis, etc. Will be available as needed.   10. COVID-19 Education: The signs and symptoms of COVID-19 were discussed with the patient and how to seek care for testing (follow up with PCP or arrange E-visit).  The importance of social distancing, staying at home, hand hygiene and wearing a mask when out in public were discussed today.  Current medicines are reviewed with the patient today.  The patient does not have concerns  regarding medicines other than what has been noted above.  The following changes have been made:  See above.  Labs/ tests ordered today include:    Orders Placed This Encounter  Procedures   Comprehensive metabolic panel   CBC   Lipid panel   TSH     Disposition:   FU with me in 4 months. Overall prognosis tenuous at best.    Patient is agreeable to this plan and will call if any problems develop in the interim.   SignedTruitt Merle, NP  09/11/2018 11:29 AM  [facility] [address] [place], [place]  [address] Phone: [phone] Fax: [phone]

## 2018-09-10 NOTE — Telephone Encounter (Signed)

## 2018-09-11 ENCOUNTER — Other Ambulatory Visit: Payer: Self-pay

## 2018-09-11 ENCOUNTER — Encounter: Payer: Self-pay | Admitting: Nurse Practitioner

## 2018-09-11 ENCOUNTER — Ambulatory Visit (INDEPENDENT_AMBULATORY_CARE_PROVIDER_SITE_OTHER): Payer: Medicare Other | Admitting: Nurse Practitioner

## 2018-09-11 VITALS — BP 130/80 | HR 95 | Ht 71.0 in | Wt 357.8 lb

## 2018-09-11 DIAGNOSIS — I5022 Chronic systolic (congestive) heart failure: Secondary | ICD-10-CM

## 2018-09-11 DIAGNOSIS — E7800 Pure hypercholesterolemia, unspecified: Secondary | ICD-10-CM

## 2018-09-11 DIAGNOSIS — I259 Chronic ischemic heart disease, unspecified: Secondary | ICD-10-CM

## 2018-09-11 DIAGNOSIS — I471 Supraventricular tachycardia: Secondary | ICD-10-CM | POA: Diagnosis not present

## 2018-09-11 DIAGNOSIS — I4719 Other supraventricular tachycardia: Secondary | ICD-10-CM

## 2018-09-11 DIAGNOSIS — Z7189 Other specified counseling: Secondary | ICD-10-CM | POA: Diagnosis not present

## 2018-09-11 DIAGNOSIS — R0602 Shortness of breath: Secondary | ICD-10-CM | POA: Diagnosis not present

## 2018-09-11 DIAGNOSIS — Z79899 Other long term (current) drug therapy: Secondary | ICD-10-CM

## 2018-09-11 DIAGNOSIS — I10 Essential (primary) hypertension: Secondary | ICD-10-CM | POA: Diagnosis not present

## 2018-09-11 LAB — COMPREHENSIVE METABOLIC PANEL
ALT: 27 IU/L (ref 0–44)
AST: 23 IU/L (ref 0–40)
Albumin/Globulin Ratio: 1.9 (ref 1.2–2.2)
Albumin: 4.9 g/dL — ABNORMAL HIGH (ref 3.8–4.8)
Alkaline Phosphatase: 115 IU/L (ref 39–117)
BUN/Creatinine Ratio: 12 (ref 10–24)
BUN: 28 mg/dL — ABNORMAL HIGH (ref 8–27)
Bilirubin Total: 1.2 mg/dL (ref 0.0–1.2)
CO2: 17 mmol/L — ABNORMAL LOW (ref 20–29)
Calcium: 10.1 mg/dL (ref 8.6–10.2)
Chloride: 104 mmol/L (ref 96–106)
Creatinine, Ser: 2.39 mg/dL — ABNORMAL HIGH (ref 0.76–1.27)
GFR calc Af Amer: 32 mL/min/{1.73_m2} — ABNORMAL LOW (ref >59)
GFR calc non Af Amer: 27 mL/min/{1.73_m2} — ABNORMAL LOW (ref >59)
Globulin, Total: 2.6 g/dL (ref 1.5–4.5)
Glucose: 92 mg/dL (ref 65–99)
Potassium: 3.9 mmol/L (ref 3.5–5.2)
Sodium: 142 mmol/L (ref 134–144)
Total Protein: 7.5 g/dL (ref 6.0–8.5)

## 2018-09-11 LAB — CBC
Hematocrit: 41.3 % (ref 37.5–51.0)
Hemoglobin: 13.6 g/dL (ref 13.0–17.7)
MCH: 29.4 pg (ref 26.6–33.0)
MCHC: 32.9 g/dL (ref 31.5–35.7)
MCV: 89 fL (ref 79–97)
Platelets: 159 10*3/uL (ref 150–450)
RBC: 4.63 x10E6/uL (ref 4.14–5.80)
RDW: 13.7 % (ref 11.6–15.4)
WBC: 6.3 10*3/uL (ref 3.4–10.8)

## 2018-09-11 LAB — LIPID PANEL
Chol/HDL Ratio: 3.1 ratio (ref 0.0–5.0)
Cholesterol, Total: 164 mg/dL (ref 100–199)
HDL: 53 mg/dL (ref >39)
LDL Calculated: 82 mg/dL (ref 0–99)
Triglycerides: 147 mg/dL (ref 0–149)
VLDL Cholesterol Cal: 29 mg/dL (ref 5–40)

## 2018-09-11 LAB — TSH: TSH: 1.63 u[IU]/mL (ref 0.450–4.500)

## 2018-09-11 NOTE — Patient Instructions (Addendum)
After Visit Summary:  We will be checking the following labs today - CMET, CBC, lipids and TSH   Medication Instructions:    Continue with your current medicines.    If you need a refill on your cardiac medications before your next appointment, please call your pharmacy.     Testing/Procedures To Be Arranged:  N/A  Follow-Up:   See me in about 4 months.     At [facility], you and your health needs are our priority.  As part of our continuing mission to provide you with exceptional heart care, we have created designated Provider Care Teams.  These Care Teams include your primary Cardiologist (physician) and Advanced Practice Providers (APPs -  Physician Assistants and Nurse Practitioners) who all work together to provide you with the care you need, when you need it.  Special Instructions:  . Stay safe, stay home, wash your hands for at least 20 seconds and wear a mask when out in public.  . It was good to talk with you today.    Call the [place] office at [phone] if you have any questions, problems or concerns.

## 2018-09-12 ENCOUNTER — Other Ambulatory Visit: Payer: Self-pay | Admitting: *Deleted

## 2018-09-12 DIAGNOSIS — N183 Chronic kidney disease, stage 3 unspecified: Secondary | ICD-10-CM

## 2018-09-14 ENCOUNTER — Encounter (HOSPITAL_BASED_OUTPATIENT_CLINIC_OR_DEPARTMENT_OTHER): Payer: Medicare Other | Attending: Internal Medicine

## 2018-09-14 ENCOUNTER — Other Ambulatory Visit: Payer: Self-pay

## 2018-09-14 DIAGNOSIS — Z85038 Personal history of other malignant neoplasm of large intestine: Secondary | ICD-10-CM | POA: Insufficient documentation

## 2018-09-14 DIAGNOSIS — G4730 Sleep apnea, unspecified: Secondary | ICD-10-CM | POA: Diagnosis not present

## 2018-09-14 DIAGNOSIS — Z955 Presence of coronary angioplasty implant and graft: Secondary | ICD-10-CM | POA: Insufficient documentation

## 2018-09-14 DIAGNOSIS — Z9049 Acquired absence of other specified parts of digestive tract: Secondary | ICD-10-CM | POA: Diagnosis not present

## 2018-09-14 DIAGNOSIS — Z932 Ileostomy status: Secondary | ICD-10-CM | POA: Diagnosis not present

## 2018-09-14 DIAGNOSIS — J449 Chronic obstructive pulmonary disease, unspecified: Secondary | ICD-10-CM | POA: Diagnosis not present

## 2018-09-14 DIAGNOSIS — Z6841 Body Mass Index (BMI) 40.0 and over, adult: Secondary | ICD-10-CM | POA: Diagnosis not present

## 2018-09-14 DIAGNOSIS — N183 Chronic kidney disease, stage 3 (moderate): Secondary | ICD-10-CM | POA: Insufficient documentation

## 2018-09-14 DIAGNOSIS — I130 Hypertensive heart and chronic kidney disease with heart failure and stage 1 through stage 4 chronic kidney disease, or unspecified chronic kidney disease: Secondary | ICD-10-CM | POA: Insufficient documentation

## 2018-09-14 DIAGNOSIS — I2510 Atherosclerotic heart disease of native coronary artery without angina pectoris: Secondary | ICD-10-CM | POA: Insufficient documentation

## 2018-09-14 DIAGNOSIS — Z87891 Personal history of nicotine dependence: Secondary | ICD-10-CM | POA: Diagnosis not present

## 2018-09-14 DIAGNOSIS — T8189XA Other complications of procedures, not elsewhere classified, initial encounter: Secondary | ICD-10-CM | POA: Diagnosis not present

## 2018-09-14 DIAGNOSIS — Y838 Other surgical procedures as the cause of abnormal reaction of the patient, or of later complication, without mention of misadventure at the time of the procedure: Secondary | ICD-10-CM | POA: Diagnosis not present

## 2018-09-14 DIAGNOSIS — I5022 Chronic systolic (congestive) heart failure: Secondary | ICD-10-CM | POA: Diagnosis not present

## 2018-09-19 DIAGNOSIS — T8189XS Other complications of procedures, not elsewhere classified, sequela: Secondary | ICD-10-CM | POA: Diagnosis not present

## 2018-09-21 DIAGNOSIS — Z87891 Personal history of nicotine dependence: Secondary | ICD-10-CM | POA: Diagnosis not present

## 2018-09-21 DIAGNOSIS — S81802A Unspecified open wound, left lower leg, initial encounter: Secondary | ICD-10-CM | POA: Diagnosis not present

## 2018-09-21 DIAGNOSIS — Z85038 Personal history of other malignant neoplasm of large intestine: Secondary | ICD-10-CM | POA: Diagnosis not present

## 2018-09-21 DIAGNOSIS — N183 Chronic kidney disease, stage 3 (moderate): Secondary | ICD-10-CM | POA: Diagnosis not present

## 2018-09-21 DIAGNOSIS — T8189XA Other complications of procedures, not elsewhere classified, initial encounter: Secondary | ICD-10-CM | POA: Diagnosis not present

## 2018-09-21 DIAGNOSIS — I130 Hypertensive heart and chronic kidney disease with heart failure and stage 1 through stage 4 chronic kidney disease, or unspecified chronic kidney disease: Secondary | ICD-10-CM | POA: Diagnosis not present

## 2018-09-21 DIAGNOSIS — I5022 Chronic systolic (congestive) heart failure: Secondary | ICD-10-CM | POA: Diagnosis not present

## 2018-09-28 ENCOUNTER — Other Ambulatory Visit: Payer: Self-pay | Admitting: Nurse Practitioner

## 2018-09-28 DIAGNOSIS — I471 Supraventricular tachycardia: Secondary | ICD-10-CM

## 2018-09-28 DIAGNOSIS — R0602 Shortness of breath: Secondary | ICD-10-CM

## 2018-09-28 DIAGNOSIS — I5022 Chronic systolic (congestive) heart failure: Secondary | ICD-10-CM

## 2018-09-28 DIAGNOSIS — E7800 Pure hypercholesterolemia, unspecified: Secondary | ICD-10-CM

## 2018-09-28 DIAGNOSIS — I259 Chronic ischemic heart disease, unspecified: Secondary | ICD-10-CM

## 2018-10-05 ENCOUNTER — Encounter (HOSPITAL_BASED_OUTPATIENT_CLINIC_OR_DEPARTMENT_OTHER): Payer: Medicare Other | Attending: Internal Medicine

## 2018-10-05 DIAGNOSIS — I509 Heart failure, unspecified: Secondary | ICD-10-CM | POA: Diagnosis not present

## 2018-10-05 DIAGNOSIS — Y838 Other surgical procedures as the cause of abnormal reaction of the patient, or of later complication, without mention of misadventure at the time of the procedure: Secondary | ICD-10-CM | POA: Insufficient documentation

## 2018-10-05 DIAGNOSIS — I110 Hypertensive heart disease with heart failure: Secondary | ICD-10-CM | POA: Diagnosis not present

## 2018-10-05 DIAGNOSIS — I2510 Atherosclerotic heart disease of native coronary artery without angina pectoris: Secondary | ICD-10-CM | POA: Insufficient documentation

## 2018-10-05 DIAGNOSIS — J449 Chronic obstructive pulmonary disease, unspecified: Secondary | ICD-10-CM | POA: Insufficient documentation

## 2018-10-05 DIAGNOSIS — G4730 Sleep apnea, unspecified: Secondary | ICD-10-CM | POA: Diagnosis not present

## 2018-10-05 DIAGNOSIS — T8189XA Other complications of procedures, not elsewhere classified, initial encounter: Secondary | ICD-10-CM | POA: Insufficient documentation

## 2018-10-09 ENCOUNTER — Other Ambulatory Visit: Payer: Medicare Other

## 2018-10-09 ENCOUNTER — Other Ambulatory Visit: Payer: Self-pay

## 2018-10-09 DIAGNOSIS — N183 Chronic kidney disease, stage 3 unspecified: Secondary | ICD-10-CM

## 2018-10-10 LAB — BASIC METABOLIC PANEL
BUN/Creatinine Ratio: 9 — ABNORMAL LOW (ref 10–24)
BUN: 20 mg/dL (ref 8–27)
CO2: 21 mmol/L (ref 20–29)
Calcium: 10.2 mg/dL (ref 8.6–10.2)
Chloride: 104 mmol/L (ref 96–106)
Creatinine, Ser: 2.12 mg/dL — ABNORMAL HIGH (ref 0.76–1.27)
GFR calc Af Amer: 37 mL/min/{1.73_m2} — ABNORMAL LOW (ref >59)
GFR calc non Af Amer: 32 mL/min/{1.73_m2} — ABNORMAL LOW (ref >59)
Glucose: 94 mg/dL (ref 65–99)
Potassium: 3.8 mmol/L (ref 3.5–5.2)
Sodium: 142 mmol/L (ref 134–144)

## 2018-10-19 DIAGNOSIS — I110 Hypertensive heart disease with heart failure: Secondary | ICD-10-CM | POA: Diagnosis not present

## 2018-10-19 DIAGNOSIS — G4730 Sleep apnea, unspecified: Secondary | ICD-10-CM | POA: Diagnosis not present

## 2018-10-19 DIAGNOSIS — T8189XA Other complications of procedures, not elsewhere classified, initial encounter: Secondary | ICD-10-CM | POA: Diagnosis not present

## 2018-10-19 DIAGNOSIS — I2510 Atherosclerotic heart disease of native coronary artery without angina pectoris: Secondary | ICD-10-CM | POA: Diagnosis not present

## 2018-10-19 DIAGNOSIS — J449 Chronic obstructive pulmonary disease, unspecified: Secondary | ICD-10-CM | POA: Diagnosis not present

## 2018-10-19 DIAGNOSIS — I509 Heart failure, unspecified: Secondary | ICD-10-CM | POA: Diagnosis not present

## 2018-10-27 ENCOUNTER — Emergency Department (HOSPITAL_COMMUNITY): Payer: Medicare Other

## 2018-10-27 ENCOUNTER — Inpatient Hospital Stay (HOSPITAL_COMMUNITY)
Admission: EM | Admit: 2018-10-27 | Discharge: 2018-11-02 | DRG: 153 | Disposition: A | Discharge status: Home / Self Care | Payer: Medicare Other | Attending: Internal Medicine | Admitting: Internal Medicine

## 2018-10-27 ENCOUNTER — Other Ambulatory Visit: Payer: Self-pay

## 2018-10-27 DIAGNOSIS — E7800 Pure hypercholesterolemia, unspecified: Secondary | ICD-10-CM | POA: Diagnosis present

## 2018-10-27 DIAGNOSIS — R252 Cramp and spasm: Secondary | ICD-10-CM

## 2018-10-27 DIAGNOSIS — N189 Chronic kidney disease, unspecified: Secondary | ICD-10-CM | POA: Diagnosis present

## 2018-10-27 DIAGNOSIS — I2510 Atherosclerotic heart disease of native coronary artery without angina pectoris: Secondary | ICD-10-CM | POA: Diagnosis present

## 2018-10-27 DIAGNOSIS — Z6841 Body Mass Index (BMI) 40.0 and over, adult: Secondary | ICD-10-CM | POA: Diagnosis not present

## 2018-10-27 DIAGNOSIS — I5032 Chronic diastolic (congestive) heart failure: Secondary | ICD-10-CM

## 2018-10-27 DIAGNOSIS — E860 Dehydration: Secondary | ICD-10-CM | POA: Diagnosis present

## 2018-10-27 DIAGNOSIS — Z955 Presence of coronary angioplasty implant and graft: Secondary | ICD-10-CM

## 2018-10-27 DIAGNOSIS — I255 Ischemic cardiomyopathy: Secondary | ICD-10-CM | POA: Diagnosis present

## 2018-10-27 DIAGNOSIS — Z833 Family history of diabetes mellitus: Secondary | ICD-10-CM

## 2018-10-27 DIAGNOSIS — Z79899 Other long term (current) drug therapy: Secondary | ICD-10-CM

## 2018-10-27 DIAGNOSIS — I739 Peripheral vascular disease, unspecified: Secondary | ICD-10-CM | POA: Diagnosis present

## 2018-10-27 DIAGNOSIS — N183 Chronic kidney disease, stage 3 unspecified: Secondary | ICD-10-CM

## 2018-10-27 DIAGNOSIS — G4733 Obstructive sleep apnea (adult) (pediatric): Secondary | ICD-10-CM | POA: Diagnosis present

## 2018-10-27 DIAGNOSIS — J029 Acute pharyngitis, unspecified: Secondary | ICD-10-CM | POA: Diagnosis present

## 2018-10-27 DIAGNOSIS — R52 Pain, unspecified: Secondary | ICD-10-CM

## 2018-10-27 DIAGNOSIS — Z7902 Long term (current) use of antithrombotics/antiplatelets: Secondary | ICD-10-CM

## 2018-10-27 DIAGNOSIS — I130 Hypertensive heart and chronic kidney disease with heart failure and stage 1 through stage 4 chronic kidney disease, or unspecified chronic kidney disease: Secondary | ICD-10-CM | POA: Diagnosis not present

## 2018-10-27 DIAGNOSIS — Z20828 Contact with and (suspected) exposure to other viral communicable diseases: Secondary | ICD-10-CM | POA: Diagnosis present

## 2018-10-27 DIAGNOSIS — E785 Hyperlipidemia, unspecified: Secondary | ICD-10-CM | POA: Diagnosis present

## 2018-10-27 DIAGNOSIS — K089 Disorder of teeth and supporting structures, unspecified: Secondary | ICD-10-CM

## 2018-10-27 DIAGNOSIS — Z23 Encounter for immunization: Secondary | ICD-10-CM

## 2018-10-27 DIAGNOSIS — J449 Chronic obstructive pulmonary disease, unspecified: Secondary | ICD-10-CM | POA: Diagnosis present

## 2018-10-27 DIAGNOSIS — J039 Acute tonsillitis, unspecified: Principal | ICD-10-CM

## 2018-10-27 DIAGNOSIS — I472 Ventricular tachycardia: Secondary | ICD-10-CM | POA: Diagnosis present

## 2018-10-27 DIAGNOSIS — Z85038 Personal history of other malignant neoplasm of large intestine: Secondary | ICD-10-CM

## 2018-10-27 DIAGNOSIS — I5042 Chronic combined systolic (congestive) and diastolic (congestive) heart failure: Secondary | ICD-10-CM | POA: Diagnosis present

## 2018-10-27 DIAGNOSIS — Z7982 Long term (current) use of aspirin: Secondary | ICD-10-CM

## 2018-10-27 DIAGNOSIS — E669 Obesity, unspecified: Secondary | ICD-10-CM | POA: Diagnosis present

## 2018-10-27 DIAGNOSIS — M542 Cervicalgia: Secondary | ICD-10-CM | POA: Diagnosis not present

## 2018-10-27 DIAGNOSIS — Z87891 Personal history of nicotine dependence: Secondary | ICD-10-CM

## 2018-10-27 DIAGNOSIS — N179 Acute kidney failure, unspecified: Secondary | ICD-10-CM | POA: Diagnosis present

## 2018-10-27 DIAGNOSIS — R1310 Dysphagia, unspecified: Secondary | ICD-10-CM | POA: Diagnosis present

## 2018-10-27 DIAGNOSIS — I10 Essential (primary) hypertension: Secondary | ICD-10-CM | POA: Diagnosis present

## 2018-10-27 LAB — CBC WITH DIFFERENTIAL/PLATELET
Abs Immature Granulocytes: 0.03 10*3/uL (ref 0.00–0.07)
Basophils Absolute: 0 10*3/uL (ref 0.0–0.1)
Basophils Relative: 0 %
Eosinophils Absolute: 0.1 10*3/uL (ref 0.0–0.5)
Eosinophils Relative: 1 %
HCT: 43.4 % (ref 39.0–52.0)
Hemoglobin: 13.7 g/dL (ref 13.0–17.0)
Immature Granulocytes: 0 %
Lymphocytes Relative: 10 %
Lymphs Abs: 1 10*3/uL (ref 0.7–4.0)
MCH: 29.3 pg (ref 26.0–34.0)
MCHC: 31.6 g/dL (ref 30.0–36.0)
MCV: 92.9 fL (ref 80.0–100.0)
Monocytes Absolute: 0.9 10*3/uL (ref 0.1–1.0)
Monocytes Relative: 10 %
Neutro Abs: 7.4 10*3/uL (ref 1.7–7.7)
Neutrophils Relative %: 79 %
Platelets: 240 10*3/uL (ref 150–400)
RBC: 4.67 MIL/uL (ref 4.22–5.81)
RDW: 13.8 % (ref 11.5–15.5)
WBC: 9.4 10*3/uL (ref 4.0–10.5)
nRBC: 0 % (ref 0.0–0.2)

## 2018-10-27 LAB — BASIC METABOLIC PANEL
Anion gap: 15 (ref 5–15)
BUN: 27 mg/dL — ABNORMAL HIGH (ref 8–23)
CO2: 22 mmol/L (ref 22–32)
Calcium: 10 mg/dL (ref 8.9–10.3)
Chloride: 99 mmol/L (ref 98–111)
Creatinine, Ser: 2.77 mg/dL — ABNORMAL HIGH (ref 0.61–1.24)
GFR calc Af Amer: 27 mL/min — ABNORMAL LOW (ref >60)
GFR calc non Af Amer: 23 mL/min — ABNORMAL LOW (ref >60)
Glucose, Bld: 104 mg/dL — ABNORMAL HIGH (ref 70–99)
Potassium: 3.9 mmol/L (ref 3.5–5.1)
Sodium: 136 mmol/L (ref 135–145)

## 2018-10-27 LAB — GROUP A STREP BY PCR: Group A Strep by PCR: NOT DETECTED

## 2018-10-27 LAB — SARS CORONAVIRUS 2 BY RT PCR (HOSPITAL ORDER, PERFORMED IN ~~LOC~~ HOSPITAL LAB): SARS Coronavirus 2: NEGATIVE

## 2018-10-27 LAB — LACTIC ACID, PLASMA
Lactic Acid, Venous: 1.5 mmol/L (ref 0.5–1.9)
Lactic Acid, Venous: 1.6 mmol/L (ref 0.5–1.9)

## 2018-10-27 MED ORDER — ACETAMINOPHEN 325 MG PO TABS
650.0000 mg | ORAL_TABLET | Freq: Four times a day (QID) | ORAL | Status: DC | PRN
  Administered 2018-10-29 – 2018-11-01 (×5): 650 mg via ORAL
  Filled 2018-10-27 (×5): qty 2

## 2018-10-27 MED ORDER — SODIUM CHLORIDE 0.9 % IV SOLN
INTRAVENOUS | Status: AC
  Administered 2018-10-27 – 2018-10-28 (×2): via INTRAVENOUS

## 2018-10-27 MED ORDER — CLINDAMYCIN PHOSPHATE 600 MG/50ML IV SOLN
600.0000 mg | Freq: Once | INTRAVENOUS | Status: AC
  Administered 2018-10-27: 21:00:00 600 mg via INTRAVENOUS
  Filled 2018-10-27: qty 50

## 2018-10-27 MED ORDER — MORPHINE SULFATE (PF) 4 MG/ML IV SOLN
4.0000 mg | Freq: Once | INTRAVENOUS | Status: AC
  Administered 2018-10-27: 4 mg via INTRAVENOUS
  Filled 2018-10-27: qty 1

## 2018-10-27 MED ORDER — ACETAMINOPHEN 650 MG RE SUPP: 650.0000 mg | Freq: Four times a day (QID) | RECTAL | Status: DC | PRN

## 2018-10-27 MED ORDER — HYDROMORPHONE HCL 1 MG/ML IJ SOLN
1.0000 mg | Freq: Once | INTRAMUSCULAR | Status: AC
  Administered 2018-10-27: 1 mg via INTRAVENOUS
  Filled 2018-10-27: qty 1

## 2018-10-27 MED ORDER — METOPROLOL TARTRATE 5 MG/5ML IV SOLN
5.0000 mg | Freq: Four times a day (QID) | INTRAVENOUS | Status: DC
  Administered 2018-10-28 (×3): 5 mg via INTRAVENOUS
  Filled 2018-10-27 (×3): qty 5

## 2018-10-27 MED ORDER — DEXAMETHASONE SODIUM PHOSPHATE 10 MG/ML IJ SOLN
10.0000 mg | Freq: Once | INTRAMUSCULAR | Status: AC
  Administered 2018-10-27: 21:00:00 10 mg via INTRAVENOUS
  Filled 2018-10-27: qty 1

## 2018-10-27 MED ORDER — CLINDAMYCIN PHOSPHATE 600 MG/50ML IV SOLN
600.0000 mg | Freq: Three times a day (TID) | INTRAVENOUS | Status: DC
  Administered 2018-10-28 – 2018-10-30 (×7): 600 mg via INTRAVENOUS
  Filled 2018-10-27 (×8): qty 50

## 2018-10-27 MED ORDER — SODIUM CHLORIDE 0.9 % IV BOLUS
500.0000 mL | Freq: Once | INTRAVENOUS | Status: AC
  Administered 2018-10-27: 500 mL via INTRAVENOUS

## 2018-10-27 MED ORDER — MORPHINE SULFATE (PF) 2 MG/ML IV SOLN: 1.0000 mg | INTRAVENOUS | Status: DC | PRN

## 2018-10-27 MED ORDER — ENOXAPARIN SODIUM 40 MG/0.4ML ~~LOC~~ SOLN
40.0000 mg | SUBCUTANEOUS | Status: DC
  Administered 2018-10-28 – 2018-10-29 (×2): 40 mg via SUBCUTANEOUS
  Filled 2018-10-27 (×2): qty 0.4

## 2018-10-27 NOTE — ED Provider Notes (Signed)
Medical screening examination/treatment/procedure(s) were conducted as a shared visit with non-physician practitioner(s) and myself.  I personally evaluated the patient during the encounter. Briefly, the patient is a 68 y.o. male with history of CKD, COPD, CAD who presents to the ED with left-sided jaw pain, sore throat.  Pain is been ongoing for the past week.  Becoming more difficult to open his mouth during this time.  Having difficulty with eating and drinking because he is unable to open his mouth.  However, he states that it does not hurt when he swallows that much.  He appears to have some fullness to the left side of his jawline, left submandibular space.  He appears to hydrated as his tongue is very dry.  Unable to look at the back of his mouth as he is unable to fully open his mouth.  He has very poor dentition.  It does not appear to be any abscess at the gumline.  The floor of his mouth is soft.  Lesser concern for Ludwig angina.  However, will total obtain a CT scan of his soft tissue neck area to evaluate for infectious process.  This could be a parotid Titus versus a dental abscess versus something in the pharyngeal space.  Patient does have a history of CKD and will attempt to try to get contrasted study based off of his kidney function today.  We will empirically give IV clindamycin.  Will check strep test, coronavirus.  Patient with negative strep test, negative coronavirus test.  No significant lactic acidosis, leukocytosis.  Patient with worsening creatinine at 2.77.  Obtain CT scan of his soft tissue of his neck without contrast due to this but still was able to see the patient appeared to have enlargement of his left pontine tonsil likely related to an acute pharyngitis/tonsillitis.  There is no obvious abscess.  Patient also has inflammatory changes within the left submandibular gland extending into the left platysma.  He has reactive lymph nodes as well.  Overall patient was given IV  clindamycin.  Given that he has trismus, signs of dehydration will admit for further IV antibiotics and IV hydration.  Hemodynamically stable throughout her care.  Airway intact overall, no drooling, able to tolerate secretions.  No concern for airway compromise.  This chart was dictated using voice recognition software.  Despite best efforts to proofread,  errors can occur which can change the documentation meaning.     EKG Interpretation None          Lennice Sites, DO 10/27/18 2307

## 2018-10-27 NOTE — ED Provider Notes (Signed)
[facility] EMERGENCY DEPARTMENT Provider Note   CSN: [id number] Arrival date & time: 10/27/18  1724     History   Chief Complaint Chief Complaint  Patient presents with   Headache   Sore Throat    HPI Larry [name] is a 68 y.o. male with history of ischemic cardiomyopathy, systolic/diastolic heart failure, CAD s/p stents, HTN, HLD, obesity atrial tachycardia presents to the ER for evaluation of left-sided jaw pain associated with left-sided sore throat, difficulty swallowing and accumulation of saliva and drooling.  Onset 3 days ago.  Constant, persistent, worsening, moderate to severe.  The pain is worse with swallowing, palpation, moving his neck. No interventions. No alleviating factors.  No fever, chills, congestion, cough, changes in his voice, neck stiffness, cough, chest pain, vomiting or other abdominal pain.  No sick contacts.  No exposures to COVID-19.  No travel.     HPI  Past Medical History:  Diagnosis Date   Arthritis    Atrial tachycardia ([place])    managed on beta blocker therapy   Childhood asthma    "went away after I was 16"   Chronic combined systolic and diastolic CHF, NYHA class 3 (HCC)    has diastolic heart failure grade 1; EF is 45 to 50% per echo 05/2011; EF 41% by Myoview 2016   CKD (chronic kidney disease) stage 3, GFR 30-59 ml/min (HCC)    Colon cancer ([place]) 2015   MSI high; IHC loss of MLH1 and PMS2; BRAF negative; Negative methylation   COPD (chronic obstructive pulmonary disease) ([place])    Coronary artery disease    Family history of ovarian cancer    Hypercholesteremia    Hypertension    Noncompliance    NSVT (nonsustained ventricular tachycardia) (HCC)    beta blocker restarted   Obesity    OSA on CPAP    used nightly, pt does not know settings   Peripheral arterial disease ([place])    4.2 cm thoracic aortic aneurysm per chest ct 11-14-15 epic   S/P colostomy ([place])    2014   Thrombocytopenia ([place])     Torsades de pointes ([place])    X 2 episodes during hospital visit 12'14"electrolyte imbalance"- "Shocked"    Patient Active Problem List   Diagnosis Date Noted   Pharyngitis 10/27/2018   Poor dentition 10/27/2018   CHF (congestive heart failure) ([place]) 10/27/2018   Sepsis due to urinary tract infection ([place]) 06/30/2017   Acute kidney injury superimposed on chronic kidney disease III ([place]) 06/30/2017   Coronary artery disease 06/30/2017   Hypertension 06/30/2017   OSA on CPAP 06/30/2017   COPD (chronic obstructive pulmonary disease) ([place]) 06/30/2017   Venous stasis 06/30/2017   Ectopic atrial tachycardia ([place]) 06/30/2017   History of colostomy reversal 06/30/2017   Hydronephrosis of left kidney with hydroureter 06/30/2017   Sepsis secondary to UTI ([place]) 06/30/2017   Genetic testing 08/08/2016   Acute pulmonary edema (HCC)    Acute respiratory failure with hypoxia (HCC)    SBO (small bowel obstruction) ([place])    Encounter for nasogastric tube placement    Abdominal pain    Hypervolemia    Pressure injury of skin 04/25/2016   Acute respiratory failure (HCC)    Adynamic ileus ([place])    Peritonitis ([place])    AKI (acute kidney injury) ([place]) 04/19/2016   Colostomy in place [facility]) 04/12/2016   Pulmonary infiltrate present on computed tomography 11/15/2015   Cough 11/14/2015   Lung nodule  Atrial tachycardia (HCC)    Chronic combined systolic and diastolic CHF, NYHA class 3 (HCC)    Palpitations    Dyspnea and respiratory abnormalities    Family history of ovarian cancer    CKD (chronic kidney disease) stage 3, GFR 30-59 ml/min ([place]) 06/12/2014   Thrombocytopenia ([place])    Ischemic chest pain ([place]) 06/11/2014   Atherosclerosis of artery of extremity with ulceration ([place]) 06/06/2014   Venous stasis ulcer of left lower extremity ([place]) 06/06/2014   Acute combined systolic and diastolic congestive heart failure ([place]) 10/03/2013   Acute  respiratory distress 09/30/2013   Respiratory distress 09/30/2013   Peripheral arterial disease ([place]) 08/21/2013   Postop check 03/26/2013   Physical deconditioning 03/01/2013   Torsades de pointes ([place]) 02/27/2013   Acute on chronic diastolic heart failure ([place]) 02/15/2013   Colon cancer ([place]) 02/09/2013   Atrial fibrillation ([place]) 02/05/2013   Acute renal failure ([place]) 02/02/2013   Hypotension 02/02/2013   Chronic blood loss anemia 02/02/2013   Edema 06/16/2011   Tachycardia 06/16/2011   IMPOTENCE OF ORGANIC ORIGIN 01/12/2009   Obstructive sleep apnea 12/04/2008   HYPERLIPIDEMIA 12/03/2008   Obesity 12/03/2008   Coronary atherosclerosis 12/03/2008   CARDIOMYOPATHY, ISCHEMIC 12/03/2008   Atrial tachycardia, paroxysmal ([place]) 12/03/2008   ASTHMA 12/03/2008   C O P D 12/03/2008   Essential hypertension 12/02/2008    Past Surgical History:  Procedure Laterality Date   COLON SURGERY     COLONOSCOPY N/A 02/08/2013   Procedure: COLONOSCOPY;  Surgeon: [name], MD;  Location: [facility];  Service: Endoscopy;  Laterality: N/A;   COLONOSCOPY WITH PROPOFOL N/A 05/09/2014   Procedure: COLONOSCOPY WITH PROPOFOL;  Surgeon: [name], MD;  Location: [facility];  Service: Endoscopy;  Laterality: N/A;   COLONOSCOPY WITH PROPOFOL N/A 01/08/2016   Procedure: COLONOSCOPY WITH PROPOFOL;  Surgeon: [name], MD;  Location: [facility];  Service: Endoscopy;  Laterality: N/A;   COLOSTOMY N/A 04/19/2016   Procedure: COLOSTOMY;  Surgeon: Judeth Horn, MD;  Location: [facility];  Service: General;  Laterality: N/A;   COLOSTOMY REVERSAL  04/12/2016   COLOSTOMY REVERSAL N/A 04/12/2016   Procedure: COLOSTOMY REVERSAL;  Surgeon: Judeth Horn, MD;  Location: [facility];  Service: General;  Laterality: N/A;   CORONARY ANGIOPLASTY WITH STENT PLACEMENT  11/12/2008; 06/11/2014   stent x 2 to RCA; stent x 2 to LAD   CORONARY ANGIOPLASTY WITH STENT PLACEMENT  06/11/2014   m-LAD  3.5 x 16 mm Synergy DES, d-LAD  2.25 x 16 mm Synergy DES   CYSTOSCOPY W/ URETERAL STENT PLACEMENT Left 06/30/2017   Procedure: CYSTOSCOPY WITH RETROGRADE PYELOGRAM/URETERAL STENT PLACEMENT;  Surgeon: Lucas Mallow, MD;  Location: [facility];  Service: Urology;  Laterality: Left;   CYSTOSCOPY WITH LITHOLAPAXY N/A 08/02/2017   Procedure: CYSTOSCOPY WITH BLADDER STONE EXTRACTION;  Surgeon: Lucas Mallow, MD;  Location: [facility];  Service: Urology;  Laterality: N/A;   CYSTOSCOPY WITH RETROGRADE PYELOGRAM, URETEROSCOPY AND STENT PLACEMENT Left 08/02/2017   Procedure: CYSTOSCOPY WITH LEFT RETROGRADE PYELOGRAM, URETEROSCOPY AND STENT EXCHANGE;  Surgeon: Lucas Mallow, MD;  Location[facility];  Service: Urology;  Laterality: Left;   ESOPHAGOGASTRODUODENOSCOPY N/A 02/08/2013   Procedure: ESOPHAGOGASTRODUODENOSCOPY (EGD);  Surgeon: [name], MD;  Location[facility];  Service: Endoscopy;  Laterality: N/A;   FLEXIBLE SIGMOIDOSCOPY N/A 02/19/2016   Procedure: FLEXIBLE SIGMOIDOSCOPY;  Surgeon: [name], MD;  [facility];  Service: Endoscopy;  Laterality: N/A;   HOLMIUM LASER APPLICATION  Left 08/02/2017   Procedure: HOLMIUM LASER APPLICATION;  Surgeon: Lucas Mallow, MD;  Location[facility];  Service: Urology;  Laterality: Left;   LAPAROTOMY N/A 02/12/2013   Procedure: EXPLORATORY LAPAROTOMY PARTIAL COLECTOMY WITH COLOSTOMY;  Surgeon: [name], MD;  Location: [facility];  Service: General;  Laterality: N/A;   LAPAROTOMY N/A 02/18/2013   Procedure: EXPLORATORY LAPAROTOMY/Closure of Wound;  Surgeon: [name], MD;  Location: [facility];  Service: General;  Laterality: N/A;   LAPAROTOMY N/A 04/19/2016   Procedure: EXPLORATORY LAPAROTOMY, REPAIR OF ANASTAMOTIC LEAK;  Surgeon: Judeth Horn, MD;  Location: [facility];  Service: General;  Laterality: N/A;   LEFT HEART CATHETERIZATION WITH CORONARY ANGIOGRAM N/A 06/11/2014   Procedure: LEFT HEART CATHETERIZATION WITH CORONARY ANGIOGRAM;   Surgeon: [name], MD; CFX calcified, 30-40 percent, RCA calcified, 40/50/40%, PDA diffuse disease, LAD 40/75/90% s/p DES 2         Home Medications    Prior to Admission medications   Medication Sig Start Date End Date Taking? Authorizing Provider  aspirin EC 81 MG tablet Take 1 tablet (81 mg total) by mouth daily. 03/06/13  Yes Love, Ivan Anchors, PA-C  atorvastatin (LIPITOR) 40 MG tablet TAKE 1 TABLET BY MOUTH EVERY DAY Patient taking differently: Take 40 mg by mouth daily at 6 PM.  12/21/17  Yes Burtis Junes, NP  clopidogrel (PLAVIX) 75 MG tablet Take 75 mg by mouth daily.   Yes [provider]  diltiazem (CARDIZEM) 90 MG tablet Take 1 tablet (90 mg total) by mouth 3 (three) times daily. 01/26/18  Yes Burtis Junes, NP  ferrous sulfate 325 (65 FE) MG tablet Take 1 tablet (325 mg total) by mouth daily with breakfast. 01/15/14  Yes Burtis Junes, NP  furosemide (LASIX) 40 MG tablet TAKE 1.5 TABLETS (60 MG TOTAL) BY MOUTH DAILY. 08/02/18  Yes Burtis Junes, NP  metoprolol tartrate (LOPRESSOR) 25 MG tablet TAKE 1.5 TABLETS (37.5 MG TOTAL) BY MOUTH 2 (TWO) TIMES DAILY. 09/10/18  Yes Burtis Junes, NP  Multiple Vitamins-Minerals (CENTRUM SILVER 50+MEN PO) Take 1 tablet by mouth daily.   Yes [provider]  nitroGLYCERIN (NITROSTAT) 0.4 MG SL tablet Place 0.4 mg under the tongue every 5 (five) minutes as needed for chest pain.   Yes [provider]  simethicone (GAS-X EXTRA STRENGTH) 125 MG chewable tablet Chew 125 mg by mouth 3 (three) times daily.   Yes [provider]  spironolactone (ALDACTONE) 25 MG tablet TAKE 0.5 TABLETS (12.5 MG TOTAL) BY MOUTH DAILY. 06/19/18 08/13/19 Yes Burtis Junes, NP  tamsulosin (FLOMAX) 0.4 MG CAPS capsule Take 1 capsule (0.4 mg total) by mouth daily. 11/15/17  Yes Burtis Junes, NP  diltiazem (CARDIZEM) 60 MG tablet TAKE 1.5 TABLETS (90 MG TOTAL) BY MOUTH 3 (THREE) TIMES DAILY. Patient not taking: Reported  on 10/27/2018 10/01/18   Burtis Junes, NP  levalbuterol [facility] HFA) 45 MCG/ACT inhaler INHALE 2 PUFFS BY MOUTH EVERY 4 HOURS AS NEEDED FOR WHEEZE Patient not taking: Reported on 10/27/2018 06/25/18   Burtis Junes, NP    Family History Family History  Problem Relation Age of Onset   Hypertension Father    Heart disease Father        before age 75   Diabetes Father    Hypertension Mother    Dementia Mother    Parkinsonism Mother    Liver cancer Maternal Grandmother        dx in her  70s   Prostate cancer Maternal Uncle    Leukemia Maternal Uncle     Social History Social History   Tobacco Use   Smoking status: Former Smoker    Packs/day: 1.00    Years: 38.00    Pack years: 38.00    Types: Cigarettes    Quit date: 07/15/2008    Years since quitting: 10.2   Smokeless tobacco: Never Used  Substance Use Topics   Alcohol use: Yes    Alcohol/week: 0.0 standard drinks    Comment: none snice colostomy placed   Drug use: No     Allergies   Patient has no known allergies.   Review of Systems Review of Systems  HENT: Positive for trouble swallowing.   Musculoskeletal: Positive for neck pain.  All other systems reviewed and are negative.    Physical Exam Updated Vital Signs BP (!) 154/87    Pulse 92    Temp 99.3 F (37.4 C) (Oral)    Resp 12    Ht 5' 11" (1.803 m)    Wt (!) 158.8 kg    SpO2 100%    BMI 48.82 kg/m   Physical Exam Vitals signs and nursing note reviewed.  Constitutional:      General: He is not in acute distress.    Appearance: He is well-developed.     Comments: NAD.  Obese.  No distress.  HENT:     Head: Normocephalic and atraumatic.     Jaw: Trismus present.     Right Ear: External ear normal.     Left Ear: External ear normal.     Ears:     Comments: Bilateral TMs occluded with cerumen.  Ear canals normal.  External ears normal.  Mastoids normal.    Nose: Nose normal.     Comments: Intranasal mucosa normal.    Mouth/Throat:       Mouth: Mucous membranes are dry.     Comments: Dry lips and MM. Trismus noted.  Only able to fit 2 fingers in the mouth.  Unable to visualize soft palate, tonsils, uvula, oropharynx.  Hard palate is normal.  There is slight accumulation of saliva at the corners of his mouth.  Poor dentition throughout.  Multiple missing teeth.  Tenderness to external left mid mandibular area. No dental abscess.  No tenderness, edema, fluctuance to the upper/lower gingiva, buccal mucosa.  Normal tongue protrusion.  Normal sublingual space.  Normal phonation. Eyes:     General: No scleral icterus.    Conjunctiva/sclera: Conjunctivae normal.  Neck:     Musculoskeletal: Normal range of motion and neck supple. Pain with movement present.     Trachea: Tracheal tenderness present.     Comments: Tenderness, edema to the left side of the trachea, left submandibular space.  Trachea is midline.  Right side of the neck is normal.  No tenderness to the SCM's.  Full neck range of motion, mild pain with left neck bend and rotation. Cardiovascular:     Rate and Rhythm: Normal rate and regular rhythm.     Heart sounds: Normal heart sounds. No murmur.  Pulmonary:     Effort: Pulmonary effort is normal.     Breath sounds: Normal breath sounds. No wheezing.  Musculoskeletal: Normal range of motion.        General: No deformity.  Lymphadenopathy:     Cervical: Cervical adenopathy present.  Skin:    General: Skin is warm and dry.     Capillary Refill: Capillary refill takes  less than 2 seconds.  Neurological:     Mental Status: He is alert and oriented to person, place, and time.  Psychiatric:        Behavior: Behavior normal.        Thought Content: Thought content normal.        Judgment: Judgment normal.      ED Treatments / Results  Labs (all labs ordered are listed, but only abnormal results are displayed) Labs Reviewed  BASIC METABOLIC PANEL - Abnormal; Notable for the following components:      Result  Value   Glucose, Bld 104 (*)    BUN 27 (*)    Creatinine, Ser 2.77 (*)    GFR calc non Af Amer 23 (*)    GFR calc Af Amer 27 (*)    All other components within normal limits  GROUP A STREP BY PCR  SARS CORONAVIRUS 2 (HOSPITAL ORDER, PERFORMED IN [facility])  CBC WITH DIFFERENTIAL/PLATELET  LACTIC ACID, PLASMA  LACTIC ACID, PLASMA  BASIC METABOLIC PANEL  HIV ANTIBODY (ROUTINE TESTING W REFLEX)    EKG None  Radiology Ct Soft Tissue Neck Wo Contrast  Result Date: 10/27/2018 CLINICAL DATA:  Neck pain, infection suspected. Patient states headache and sore throat for 3 days. EXAM: CT NECK WITHOUT CONTRAST TECHNIQUE: Multidetector CT imaging of the neck was performed following the standard protocol without intravenous contrast. COMPARISON:  None. FINDINGS: Pharynx and larynx: No focal mucosal or submucosal lesions are present. The left palatine tonsil is enlarged. No definite abscess is present. There is some stranding of left parapharyngeal fat. Salivary glands: There is some stranding about the left submandibular gland. No duct dilation is evident. The right submandibular gland and bilateral parotid glands are within normal limits. Thyroid: Normal Lymph nodes: Significant cervical adenopathy is present. Small level 2 lymph nodes are present bilaterally. Reactive left submandibular lymph nodes are present. Vascular: Dense atherosclerotic calcifications are present at the carotid bifurcations bilaterally. Additional calcifications are present in the proximal great vessels. Limited intracranial: Intracranial atherosclerotic calcifications are present. No focal parenchymal abnormalities are present. Visualized orbits: Globes and orbits are within normal limits. Mastoids and visualized paranasal sinuses: Mucosal thickening is present along the floor of the left maxillary sinus. There is marked lucency about the residual left maxillary molar tooth with tooth roots protruding into the sinus.  Skeleton: Multilevel degenerative changes are present in the cervical spine. No focal lytic or blastic lesions are present. Upper chest: The lung apices are clear.  Thoracic inlet is normal. IMPRESSION: 1. Asymmetric enlargement of the left palatine tonsil may related to acute pharyngitis or tonsillitis. No discrete abscess is present. 2. Inflammatory changes about the left submandibular gland extending into the left platysma likely extends from the tonsil. 3. Reactive lymph nodes. 4. Atherosclerotic changes are noted in the proximal great vessels and at the carotid bifurcations bilaterally. 5. Multilevel degenerative changes in the cervical spine. 6. Lucency about the tooth roots of the residual left maxillary molar. The roots extend into the left maxillary sinus with there is mucosal thickening along the floor. This may be related. Electronically Signed   By: San Morelle M.D.   On: 10/27/2018 21:29    Procedures Procedures (including critical care time)  Medications Ordered in ED Medications  clindamycin (CLEOCIN) IVPB 600 mg (has no administration in time range)  morphine 2 MG/ML injection 1 mg (has no administration in time range)  0.9 %  sodium chloride infusion ( Intravenous New Bag/Given 10/27/18  2333)  enoxaparin (LOVENOX) injection 40 mg (has no administration in time range)  metoprolol tartrate (LOPRESSOR) injection 5 mg (has no administration in time range)  acetaminophen (TYLENOL) tablet 650 mg (has no administration in time range)    Or  acetaminophen (TYLENOL) suppository 650 mg (has no administration in time range)  sodium chloride 0.9 % bolus 500 mL (0 mLs Intravenous Stopped 10/27/18 2233)  dexamethasone (DECADRON) injection 10 mg (10 mg Intravenous Given 10/27/18 2110)  clindamycin (CLEOCIN) IVPB 600 mg (0 mg Intravenous Stopped 10/27/18 2233)  morphine 4 MG/ML injection 4 mg (4 mg Intravenous Given 10/27/18 2111)  HYDROmorphone (DILAUDID) injection 1 mg (1 mg Intravenous Given  10/27/18 2243)     Initial Impression / Assessment and Plan / ED Course  I have reviewed the triage vital signs and the nursing notes.  Pertinent labs & imaging results that were available during my care of the patient were reviewed by me and considered in my medical decision making (see chart for details).  Clinical Course as of Oct 27 2347  Sat Oct 27, 2018  2209 Creatinine(!): 2.77 [CG]  2345 1. Asymmetric enlargement of the left palatine tonsil may related to acute pharyngitis or tonsillitis. No discrete abscess is present. 2. Inflammatory changes about the left submandibular gland extending into the left platysma likely extends from the tonsil. 3. Reactive lymph nodes. 4. Atherosclerotic changes are noted in the proximal great vessels and at the carotid bifurcations bilaterally. 5. Multilevel degenerative changes in the cervical spine. 6. Lucency about the tooth roots of the residual left maxillary molar. The roots extend into the left maxillary sinus with there is mucosal thickening along the floor. This may be related.  CT Soft Tissue Neck Wo Contrast [CG]    Clinical Course User Index [CG] Kinnie Feil, PA-C    67 year old with sore throat, left-sided neck pain, slight drooling, trismus.  I reviewed patient's EMR to obtain pertinent PMH.  No history of diabetes.  Exam reveals tachycardia 109, oral temp 99.3.  Hemodynamically stable.  Overall nontoxic appearing but has trismus, left-sided submandibular and tracheal tenderness, pain with neck movement.  I am unable to visualize soft palate, oropharynx, tonsils due to trismus.   Highest on DDX is strep pharyngitis VS viral pharyngitis VS peritonsillar abscess VS retropharyngeal abscess.  His sublingual space is normal and I doubt Ludwig's.  Doubt epiglottitis in this voluminous patient. No signs of dental abscess.  Will obtain labs including lactic acid, strep, CT soft tissue neck.  We will give small IV bolus given history  of CHF, clindamycin and morphine.  2345: Lab work reviewed by me.  No leukocytosis.  No lactic acidosis x2.  Strep test is negative.  COVID-19 test is negative.  Mild elevation in creatinine 2.77 from baseline in the low 2's likely from decreased PO intake/mild dehydration.  CT shows asymmetricleft sided pharyngitis/tonsillitis but no abscess with inflammatory changes extending to the submandibular gland, platysma, reactive lymph nodes on the side.  Lucency in the left maxillary molar which also fits clinical exam as he has focal tenderness along the left mandibular angle, this could be the etiology of the infectious/inflammatory process.  Patient was reassessed.  He reports no improvement in his pain.  Continues to have 1-2 finger trismus.  Given age, comorbidities, mild elevation in creatinine I am concerned he may not be able to stay orally hydrated at home or tolerate his oral medicines.  Discussed option of being discharged with oral medicines to [facility] hospital  admission and patient agreed with admission for IV antibiotics, steroids, close monitoring.  Discussed with Dr. Sid [name] or who is admitted patient.  Shared with EDP.  Final Clinical Impressions(s) / ED Diagnoses   Final diagnoses:  Tonsillitis  Dehydration    ED Discharge Orders    None       Arlean Hopping 10/27/18 2349    Lennice Sites, DO 10/28/18 2002    Lennice Sites, DO 10/28/18 2003

## 2018-10-27 NOTE — ED Triage Notes (Addendum)
Patient endorses headache and sore throat x 3 days. No relief with OTC medications. Denies fevers/chills, cough.

## 2018-10-27 NOTE — ED Notes (Signed)
Denies any other s/sx.  No weakness, dizziness, CP or SOB.  States able to swallow but with pain.  Alert and oriented in NAD.

## 2018-10-27 NOTE — H&P (Signed)
History and Physical    Larry [name] EVO:[id number] DOB: April 12, 1950 DOA: 10/27/2018  PCP: Lin Landsman, MD Patient coming from: Home  Chief Complaint: Sore throat  HPI: Larry [name] is a 68 y.o. male with medical history significant of atrial tachycardia, NSVT, torsades, chronic combined systolic and diastolic congestive heart failure, CKD stage III, COPD, CAD status post PCI, hypertension, hyperlipidemia, OSA on CPAP, PAD, history of colon cancer status post colostomy, and conditions listed below presenting to the hospital for evaluation of sore throat.  Patient states for the past 1 week he has had a sore throat and it is not getting better.  He is not having any headaches, fevers, runny nose, cough, or shortness of breath.  He can barely open his mouth and has not been able to eat for the past 3 days.  No recent sick contacts.  No other complaints.  ED Course: Slightly tachycardic, remainder of vitals stable.  No leukocytosis.  Lactic acid normal.  Creatinine 2.7, previously 2.1-2.3 this year.  Group A strep PCR negative.  SARS-CoV-2 test pending.  CT soft tissue neck showed asymmetric enlargement of the left palatine tonsil suspicious for acute pharyngitis or tonsillitis.  No discrete abscess present.  Inflammatory changes about the left submandibular gland extending into the left platysma likely extending from the tonsil.  Reactive lymph nodes. Patient received dexamethasone 10 mg, Dilaudid 1 mg, morphine 4 mg, clindamycin 600 mg, and a 500 cc normal saline bolus.  Review of Systems:  All systems reviewed and apart from history of presenting illness, are negative.  Past Medical History:  Diagnosis Date   Arthritis    Atrial tachycardia ([place])    managed on beta blocker therapy   Childhood asthma    "went away after I was 16"   Chronic combined systolic and diastolic CHF, NYHA class 3 (HCC)    has diastolic heart failure grade 1; EF is 45 to 50% per echo 05/2011; EF 41% by  Myoview 2016   CKD (chronic kidney disease) stage 3, GFR 30-59 ml/min (HCC)    Colon cancer ([place]) 2015   MSI high; IHC loss of MLH1 and PMS2; BRAF negative; Negative methylation   COPD (chronic obstructive pulmonary disease) ([place])    Coronary artery disease    Family history of ovarian cancer    Hypercholesteremia    Hypertension    Noncompliance    NSVT (nonsustained ventricular tachycardia) (HCC)    beta blocker restarted   Obesity    OSA on CPAP    used nightly, pt does not know settings   Peripheral arterial disease ([place])    4.2 cm thoracic aortic aneurysm per chest ct 11-14-15 epic   S/P colostomy ([place])    2014   Thrombocytopenia ([place])    Torsades de pointes ([place])    X 2 episodes during hospital visit 12'14"electrolyte imbalance"- "Shocked"    Past Surgical History:  Procedure Laterality Date   COLON SURGERY     COLONOSCOPY N/A 02/08/2013   Procedure: COLONOSCOPY;  Surgeon: [name], MD;  Location[facility] ENDOSCOPY;  Service: Endoscopy;  Laterality: N/A;   COLONOSCOPY WITH PROPOFOL N/A 05/09/2014   Procedure: COLONOSCOPY WITH PROPOFOL;  Surgeon: [name], MD;  Location: [facility] ENDOSCOPY;  Service: Endoscopy;  Laterality: N/A;   COLONOSCOPY WITH PROPOFOL N/A 01/08/2016   Procedure: COLONOSCOPY WITH PROPOFOL;  Surgeon: [name], MD;  Location: [facility] ENDOSCOPY;  Service: Endoscopy;  Laterality: N/A;   COLOSTOMY N/A 04/19/2016   Procedure: COLOSTOMY;  Surgeon:  Judeth Horn, MD;  Location: [place];  Service: General;  Laterality: N/A;   COLOSTOMY REVERSAL  04/12/2016   COLOSTOMY REVERSAL N/A 04/12/2016   Procedure: COLOSTOMY REVERSAL;  Surgeon: Judeth Horn, MD;  Location: [place];  Service: General;  Laterality: N/A;   CORONARY ANGIOPLASTY WITH STENT PLACEMENT  11/12/2008; 06/11/2014   stent x 2 to RCA; stent x 2 to LAD   CORONARY ANGIOPLASTY WITH STENT PLACEMENT  06/11/2014   m-LAD 3.5 x 16 mm Synergy DES, d-LAD  2.25 x 16 mm Synergy DES   CYSTOSCOPY W/  URETERAL STENT PLACEMENT Left 06/30/2017   Procedure: CYSTOSCOPY WITH RETROGRADE PYELOGRAM/URETERAL STENT PLACEMENT;  Surgeon: Lucas Mallow, MD;  Location: [place];  Service: Urology;  Laterality: Left;   CYSTOSCOPY WITH LITHOLAPAXY N/A 08/02/2017   Procedure: CYSTOSCOPY WITH BLADDER STONE EXTRACTION;  Surgeon: Lucas Mallow, MD;  Location: [facility];  Service: Urology;  Laterality: N/A;   CYSTOSCOPY WITH RETROGRADE PYELOGRAM, URETEROSCOPY AND STENT PLACEMENT Left 08/02/2017   Procedure: CYSTOSCOPY WITH LEFT RETROGRADE PYELOGRAM, URETEROSCOPY AND STENT EXCHANGE;  Surgeon: Lucas Mallow, MD;  Location: [facility];  Service: Urology;  Laterality: Left;   ESOPHAGOGASTRODUODENOSCOPY N/A 02/08/2013   Procedure: ESOPHAGOGASTRODUODENOSCOPY (EGD);  Surgeon: [name], MD;  Location[facility] ENDOSCOPY;  Service: Endoscopy;  Laterality: N/A;   FLEXIBLE SIGMOIDOSCOPY N/A 02/19/2016   Procedure: FLEXIBLE SIGMOIDOSCOPY;  Surgeon: [name], MD;  Location: [facility]OS[facility];  Service: Endoscopy;  Laterality: N/A;   HOLMIUM LASER APPLICATION Left 2/63/3354   Procedure: HOLMIUM LASER APPLICATION;  Surgeon: Lucas Mallow, MD;  Location: [facility];  Service: Urology;  Laterality: Left;   LAPAROTOMY N/A 02/12/2013   Procedure: EXPLORATORY LAPAROTOMY PARTIAL COLECTOMY WITH COLOSTOMY;  Surgeon: [name], MD;  Location: [place];  Service: General;  Laterality: N/A;   LAPAROTOMY N/A 02/18/2013   Procedure: EXPLORATORY LAPAROTOMY/Closure of Wound;  Surgeon: [name], MD;  Location: [place];  Service: General;  Laterality: N/A;   LAPAROTOMY N/A 04/19/2016   Procedure: EXPLORATORY LAPAROTOMY, REPAIR OF ANASTAMOTIC LEAK;  Surgeon: Judeth Horn, MD;  Location: [place];  Service: General;  Laterality: N/A;   LEFT HEART CATHETERIZATION WITH CORONARY ANGIOGRAM N/A 06/11/2014   Procedure: LEFT HEART CATHETERIZATION WITH CORONARY ANGIOGRAM;  Surgeon: [name], MD; CFX calcified, 30-40 percent, RCA calcified,  40/50/40%, PDA diffuse disease, LAD 40/75/90% s/p DES 2      reports that he quit smoking about 10 years ago. His smoking use included cigarettes. He has a 38.00 pack-year smoking history. He has never used smokeless tobacco. He reports current alcohol use. He reports that he does not use drugs.  No Known Allergies  Family History  Problem Relation Age of Onset   Hypertension Father    Heart disease Father        before age 85   Diabetes Father    Hypertension Mother    Dementia Mother    Parkinsonism Mother    Liver cancer Maternal Grandmother        dx in her 68s   Prostate cancer Maternal Uncle    Leukemia Maternal Uncle     Prior to Admission medications   Medication Sig Start Date End Date Taking? Authorizing Provider  aspirin EC 81 MG tablet Take 1 tablet (81 mg total) by mouth daily. 03/06/13   Love, Ivan Anchors, PA-C  atorvastatin (LIPITOR) 40 MG tablet TAKE 1 TABLET BY MOUTH EVERY DAY 12/21/17   Burtis Junes, NP  clopidogrel (  PLAVIX) 75 MG tablet Take 75 mg by mouth daily.    [provider]  diltiazem (CARDIZEM) 60 MG tablet TAKE 1.5 TABLETS (90 MG TOTAL) BY MOUTH 3 (THREE) TIMES DAILY. 10/01/18   Burtis Junes, NP  diltiazem (CARDIZEM) 90 MG tablet Take 1 tablet (90 mg total) by mouth 3 (three) times daily. 01/26/18   Burtis Junes, NP  ferrous sulfate 325 (65 FE) MG tablet Take 1 tablet (325 mg total) by mouth daily with breakfast. 01/15/14   Burtis Junes, NP  furosemide (LASIX) 40 MG tablet TAKE 1.5 TABLETS (60 MG TOTAL) BY MOUTH DAILY. 08/02/18   Burtis Junes, NP  levalbuterol (XOPENEX HFA) 45 MCG/ACT inhaler INHALE 2 PUFFS BY MOUTH EVERY 4 HOURS AS NEEDED FOR WHEEZE 06/25/18   Burtis Junes, NP  metoprolol tartrate (LOPRESSOR) 25 MG tablet TAKE 1.5 TABLETS (37.5 MG TOTAL) BY MOUTH 2 (TWO) TIMES DAILY. 09/10/18   Burtis Junes, NP  Multiple Vitamins-Minerals (CENTRUM SILVER 50+MEN PO) Take 1 tablet by mouth daily.    [provider]  nitroGLYCERIN (NITROSTAT) 0.4 MG SL tablet Place 0.4 mg under the tongue every 5 (five) minutes as needed for chest pain.    [provider]  simethicone (GAS-X EXTRA STRENGTH) 125 MG chewable tablet Chew 125 mg by mouth 3 (three) times daily.    [provider]  spironolactone (ALDACTONE) 25 MG tablet TAKE 0.5 TABLETS (12.5 MG TOTAL) BY MOUTH DAILY. 06/19/18 08/13/19  Burtis Junes, NP  tamsulosin (FLOMAX) 0.4 MG CAPS capsule Take 1 capsule (0.4 mg total) by mouth daily. 11/15/17   Burtis Junes, NP    Physical Exam: Vitals:   10/27/18 2145 10/27/18 2200 10/27/18 2215 10/27/18 2230  BP: 117/79 139/83 (!) 153/84 (!) 147/88  Pulse: 92     Resp: _0 Temp:      TempSrc:      SpO2: 100%     Weight:      Height:        Physical Exam  Constitutional: He is oriented to person, place, and time. He appears well-developed and well-nourished. No distress.  HENT:  Head: Normocephalic.  Unable to assess oropharynx secondary to trismus/not being able to fully open his mouth. Left lower face/cheek area appears swollen Tender left-sided anterior cervical lymphadenopathy  Eyes: Right eye exhibits no discharge. Left eye exhibits no discharge.  Neck: Neck supple.  Cardiovascular: Normal rate, regular rhythm and intact distal pulses.  Pulmonary/Chest: Effort normal and breath sounds normal. No respiratory distress. He has no wheezes. He has no rales.  Abdominal: Soft. Bowel sounds are normal. He exhibits no distension. There is no abdominal tenderness. There is no guarding.  Musculoskeletal:     Comments: Trace pedal edema  Neurological: He is alert and oriented to person, place, and time.  Skin: Skin is warm and dry. He is not diaphoretic.  Chronic venous stasis dermatitis of bilateral lower extremities     Labs on Admission: I have personally reviewed following labs and imaging studies  CBC: Recent Labs  Lab 10/27/18 1847  WBC 9.4  NEUTROABS  7.4  HGB 13.7  HCT 43.4  MCV 92.9  PLT 510   Basic Metabolic Panel: Recent Labs  Lab 10/27/18 1847  NA 136  K 3.9  CL 99  CO2 22  GLUCOSE 104*  BUN 27*  CREATININE 2.77*  CALCIUM 10.0   GFR: Estimated Creatinine Clearance: 40.9 mL/min (A) (by C-G formula based  on SCr of 2.77 mg/dL (H)). Liver Function Tests: No results for input(s): AST, ALT, ALKPHOS, BILITOT, PROT, ALBUMIN in the last 168 hours. No results for input(s): LIPASE, AMYLASE in the last 168 hours. No results for input(s): AMMONIA in the last 168 hours. Coagulation Profile: No results for input(s): INR, PROTIME in the last 168 hours. Cardiac Enzymes: No results for input(s): CKTOTAL, CKMB, CKMBINDEX, TROPONINI in the last 168 hours. BNP (last 3 results) No results for input(s): PROBNP in the last 8760 hours. HbA1C: No results for input(s): HGBA1C in the last 72 hours. CBG: No results for input(s): GLUCAP in the last 168 hours. Lipid Profile: No results for input(s): CHOL, HDL, LDLCALC, TRIG, CHOLHDL, LDLDIRECT in the last 72 hours. Thyroid Function Tests: No results for input(s): TSH, T4TOTAL, FREET4, T3FREE, THYROIDAB in the last 72 hours. Anemia Panel: No results for input(s): VITAMINB12, FOLATE, FERRITIN, TIBC, IRON, RETICCTPCT in the last 72 hours. Urine analysis:    Component Value Date/Time   COLORURINE AMBER (A) 06/30/2017 1445   APPEARANCEUR CLOUDY (A) 06/30/2017 1445   LABSPEC 1.014 06/30/2017 1445   PHURINE 5.0 06/30/2017 1445   GLUCOSEU NEGATIVE 06/30/2017 1445   HGBUR LARGE (A) 06/30/2017 1445   BILIRUBINUR NEGATIVE 06/30/2017 1445   KETONESUR NEGATIVE 06/30/2017 1445   PROTEINUR 100 (A) 06/30/2017 1445   UROBILINOGEN 0.2 03/04/2013 1020   NITRITE NEGATIVE 06/30/2017 1445   LEUKOCYTESUR MODERATE (A) 06/30/2017 1445    Radiological Exams on Admission: Ct Soft Tissue Neck Wo Contrast  Result Date: 10/27/2018 CLINICAL DATA:  Neck pain, infection suspected. Patient states headache and  sore throat for 3 days. EXAM: CT NECK WITHOUT CONTRAST TECHNIQUE: Multidetector CT imaging of the neck was performed following the standard protocol without intravenous contrast. COMPARISON:  None. FINDINGS: Pharynx and larynx: No focal mucosal or submucosal lesions are present. The left palatine tonsil is enlarged. No definite abscess is present. There is some stranding of left parapharyngeal fat. Salivary glands: There is some stranding about the left submandibular gland. No duct dilation is evident. The right submandibular gland and bilateral parotid glands are within normal limits. Thyroid: Normal Lymph nodes: Significant cervical adenopathy is present. Small level 2 lymph nodes are present bilaterally. Reactive left submandibular lymph nodes are present. Vascular: Dense atherosclerotic calcifications are present at the carotid bifurcations bilaterally. Additional calcifications are present in the proximal great vessels. Limited intracranial: Intracranial atherosclerotic calcifications are present. No focal parenchymal abnormalities are present. Visualized orbits: Globes and orbits are within normal limits. Mastoids and visualized paranasal sinuses: Mucosal thickening is present along the floor of the left maxillary sinus. There is marked lucency about the residual left maxillary molar tooth with tooth roots protruding into the sinus. Skeleton: Multilevel degenerative changes are present in the cervical spine. No focal lytic or blastic lesions are present. Upper chest: The lung apices are clear.  Thoracic inlet is normal. IMPRESSION: 1. Asymmetric enlargement of the left palatine tonsil may related to acute pharyngitis or tonsillitis. No discrete abscess is present. 2. Inflammatory changes about the left submandibular gland extending into the left platysma likely extends from the tonsil. 3. Reactive lymph nodes. 4. Atherosclerotic changes are noted in the proximal great vessels and at the carotid bifurcations  bilaterally. 5. Multilevel degenerative changes in the cervical spine. 6. Lucency about the tooth roots of the residual left maxillary molar. The roots extend into the left maxillary sinus with there is mucosal thickening along the floor. This may be related. Electronically Signed   By: Harrell Gave  Mattern M.D.   On: 10/27/2018 21:29    Assessment/Plan Principal Problem:   Pharyngitis Active Problems:   Essential hypertension   AKI (acute kidney injury) ([place])   Poor dentition   CHF (congestive heart failure) (HCC)   Acute pharyngitis/ tonsillitis Afebrile and no leukocytosis.  Lactic acid normal.  Group A strep PCR negative.  Does have trismus but no stridor or respiratory distress. CT soft tissue neck showed asymmetric enlargement of the left palatine tonsil suspicious for acute pharyngitis or tonsillitis.  No discrete abscess present.  Inflammatory changes about the left submandibular gland extending into the left platysma likely extending from the tonsil.  Reactive lymph nodes.  -Clear liquid diet if able to tolerate.  IV fluid hydration.  SLP eval in a.m. -Received dexamethasone 10 mg -IV clindamycin -SARS-CoV-2 test pending.  If negative, order respiratory viral panel. -HIV antibody -Morphine PRN pain, Tylenol PRN -Continuous pulse ox, monitor respiratory status  AKI on CKD 3 Creatinine 2.7, previously 2.1-2.3 this year.   Likely due to dehydration from poor p.o. intake. -IV fluid hydration -Monitor renal function -Avoid nephrotoxic agents and contrast  Poor dentition CT showing lucency about the tooth roots of the residual left maxillary molar. The roots extend into the left maxillary sinus where there is mucosal thickening along the floor.  Possibly related to acute pharyngitis/tonsillitis mentioned above.  Unable to assess oropharynx at this time due to trismus. -Patient will need dentistry follow-up.  Chronic combined systolic and diastolic congestive heart failure No signs  of volume overload at this time.  Appears dehydrated from poor p.o. intake. -Hold home diuretic -Continue metoprolol in IV form  Hypertension -Continue metoprolol in IV form  Order remainder of home p.o. medications when able to swallow.  DVT prophylaxis: Lovenox Code Status: Full code.  Discussed with the patient. Family Communication: Wife at bedside. Disposition Plan: Anticipate discharge in 1 to 2 days. Consults called: None Admission status: It is my clinical opinion that referral for OBSERVATION is reasonable and necessary in this patient based on the above information provided. The aforementioned taken together are felt to place the patient at high risk for further clinical deterioration. However it is anticipated that the patient may be medically stable for discharge from the hospital within 24 to 48 hours.  The medical decision making on this patient was of high complexity and the patient is at high risk for clinical deterioration, therefore this is a level 3 visit.  Shela Leff MD [facility] Pager [phone]  If 7PM-7AM, please contact night-coverage [email or web address] Password [facility]  10/27/2018, 11:36 PM

## 2018-10-28 ENCOUNTER — Encounter (HOSPITAL_COMMUNITY): Payer: Self-pay

## 2018-10-28 DIAGNOSIS — Z7901 Long term (current) use of anticoagulants: Secondary | ICD-10-CM | POA: Diagnosis not present

## 2018-10-28 DIAGNOSIS — N183 Chronic kidney disease, stage 3 (moderate): Secondary | ICD-10-CM

## 2018-10-28 DIAGNOSIS — Z85038 Personal history of other malignant neoplasm of large intestine: Secondary | ICD-10-CM | POA: Diagnosis not present

## 2018-10-28 DIAGNOSIS — I2510 Atherosclerotic heart disease of native coronary artery without angina pectoris: Secondary | ICD-10-CM | POA: Diagnosis present

## 2018-10-28 DIAGNOSIS — I739 Peripheral vascular disease, unspecified: Secondary | ICD-10-CM | POA: Diagnosis present

## 2018-10-28 DIAGNOSIS — I255 Ischemic cardiomyopathy: Secondary | ICD-10-CM | POA: Diagnosis present

## 2018-10-28 DIAGNOSIS — Z7902 Long term (current) use of antithrombotics/antiplatelets: Secondary | ICD-10-CM | POA: Diagnosis not present

## 2018-10-28 DIAGNOSIS — Z23 Encounter for immunization: Secondary | ICD-10-CM | POA: Diagnosis not present

## 2018-10-28 DIAGNOSIS — R220 Localized swelling, mass and lump, head: Secondary | ICD-10-CM | POA: Diagnosis not present

## 2018-10-28 DIAGNOSIS — I10 Essential (primary) hypertension: Secondary | ICD-10-CM | POA: Diagnosis not present

## 2018-10-28 DIAGNOSIS — Z7982 Long term (current) use of aspirin: Secondary | ICD-10-CM | POA: Diagnosis not present

## 2018-10-28 DIAGNOSIS — Z79899 Other long term (current) drug therapy: Secondary | ICD-10-CM | POA: Diagnosis not present

## 2018-10-28 DIAGNOSIS — G4733 Obstructive sleep apnea (adult) (pediatric): Secondary | ICD-10-CM | POA: Diagnosis present

## 2018-10-28 DIAGNOSIS — Z20828 Contact with and (suspected) exposure to other viral communicable diseases: Secondary | ICD-10-CM | POA: Diagnosis present

## 2018-10-28 DIAGNOSIS — I5042 Chronic combined systolic (congestive) and diastolic (congestive) heart failure: Secondary | ICD-10-CM | POA: Diagnosis present

## 2018-10-28 DIAGNOSIS — I130 Hypertensive heart and chronic kidney disease with heart failure and stage 1 through stage 4 chronic kidney disease, or unspecified chronic kidney disease: Secondary | ICD-10-CM | POA: Diagnosis present

## 2018-10-28 DIAGNOSIS — Z833 Family history of diabetes mellitus: Secondary | ICD-10-CM | POA: Diagnosis not present

## 2018-10-28 DIAGNOSIS — Z7289 Other problems related to lifestyle: Secondary | ICD-10-CM | POA: Diagnosis not present

## 2018-10-28 DIAGNOSIS — E860 Dehydration: Secondary | ICD-10-CM | POA: Diagnosis present

## 2018-10-28 DIAGNOSIS — K089 Disorder of teeth and supporting structures, unspecified: Secondary | ICD-10-CM | POA: Diagnosis not present

## 2018-10-28 DIAGNOSIS — N179 Acute kidney failure, unspecified: Secondary | ICD-10-CM | POA: Diagnosis not present

## 2018-10-28 DIAGNOSIS — I5032 Chronic diastolic (congestive) heart failure: Secondary | ICD-10-CM

## 2018-10-28 DIAGNOSIS — J029 Acute pharyngitis, unspecified: Secondary | ICD-10-CM | POA: Diagnosis not present

## 2018-10-28 DIAGNOSIS — E785 Hyperlipidemia, unspecified: Secondary | ICD-10-CM | POA: Diagnosis present

## 2018-10-28 DIAGNOSIS — J039 Acute tonsillitis, unspecified: Secondary | ICD-10-CM | POA: Diagnosis not present

## 2018-10-28 DIAGNOSIS — I472 Ventricular tachycardia: Secondary | ICD-10-CM | POA: Diagnosis present

## 2018-10-28 DIAGNOSIS — E669 Obesity, unspecified: Secondary | ICD-10-CM | POA: Diagnosis present

## 2018-10-28 DIAGNOSIS — N189 Chronic kidney disease, unspecified: Secondary | ICD-10-CM

## 2018-10-28 DIAGNOSIS — E7800 Pure hypercholesterolemia, unspecified: Secondary | ICD-10-CM | POA: Diagnosis present

## 2018-10-28 DIAGNOSIS — R221 Localized swelling, mass and lump, neck: Secondary | ICD-10-CM | POA: Diagnosis not present

## 2018-10-28 DIAGNOSIS — Z955 Presence of coronary angioplasty implant and graft: Secondary | ICD-10-CM | POA: Diagnosis not present

## 2018-10-28 DIAGNOSIS — Z6841 Body Mass Index (BMI) 40.0 and over, adult: Secondary | ICD-10-CM | POA: Diagnosis not present

## 2018-10-28 DIAGNOSIS — J449 Chronic obstructive pulmonary disease, unspecified: Secondary | ICD-10-CM | POA: Diagnosis present

## 2018-10-28 DIAGNOSIS — Z87891 Personal history of nicotine dependence: Secondary | ICD-10-CM | POA: Diagnosis not present

## 2018-10-28 LAB — RESPIRATORY PANEL BY PCR

## 2018-10-28 LAB — HIV ANTIBODY (ROUTINE TESTING W REFLEX): HIV Screen 4th Generation wRfx: NONREACTIVE

## 2018-10-28 LAB — BASIC METABOLIC PANEL
Anion gap: 13 (ref 5–15)
BUN: 27 mg/dL — ABNORMAL HIGH (ref 8–23)
CO2: 20 mmol/L — ABNORMAL LOW (ref 22–32)
Calcium: 9.6 mg/dL (ref 8.9–10.3)
Chloride: 104 mmol/L (ref 98–111)
Creatinine, Ser: 2.37 mg/dL — ABNORMAL HIGH (ref 0.61–1.24)
GFR calc Af Amer: 32 mL/min — ABNORMAL LOW (ref >60)
GFR calc non Af Amer: 28 mL/min — ABNORMAL LOW (ref >60)
Glucose, Bld: 157 mg/dL — ABNORMAL HIGH (ref 70–99)
Potassium: 4.1 mmol/L (ref 3.5–5.1)
Sodium: 137 mmol/L (ref 135–145)

## 2018-10-28 MED ORDER — NITROGLYCERIN 0.4 MG SL SUBL: 0.4000 mg | SUBLINGUAL_TABLET | SUBLINGUAL | Status: DC | PRN

## 2018-10-28 MED ORDER — ASPIRIN EC 81 MG PO TBEC
81.0000 mg | DELAYED_RELEASE_TABLET | Freq: Every day | ORAL | Status: DC
  Administered 2018-10-29 – 2018-11-02 (×5): 81 mg via ORAL
  Filled 2018-10-28 (×6): qty 1

## 2018-10-28 MED ORDER — CLOPIDOGREL BISULFATE 75 MG PO TABS
75.0000 mg | ORAL_TABLET | Freq: Every day | ORAL | Status: DC
  Administered 2018-10-28 – 2018-11-02 (×6): 75 mg via ORAL
  Filled 2018-10-28 (×6): qty 1

## 2018-10-28 MED ORDER — SODIUM CHLORIDE 0.9 % IV SOLN
INTRAVENOUS | Status: AC
  Administered 2018-10-28: 17:00:00 via INTRAVENOUS

## 2018-10-28 MED ORDER — DILTIAZEM HCL 60 MG PO TABS
90.0000 mg | ORAL_TABLET | Freq: Three times a day (TID) | ORAL | Status: DC
  Administered 2018-10-28 – 2018-11-02 (×15): 90 mg via ORAL
  Filled 2018-10-28 (×15): qty 1

## 2018-10-28 MED ORDER — HYDROCERIN EX CREA
TOPICAL_CREAM | Freq: Two times a day (BID) | CUTANEOUS | Status: DC
  Administered 2018-10-29 – 2018-11-02 (×7): via TOPICAL
  Filled 2018-10-28: qty 113

## 2018-10-28 MED ORDER — TAMSULOSIN HCL 0.4 MG PO CAPS
0.4000 mg | ORAL_CAPSULE | Freq: Every day | ORAL | Status: DC
  Administered 2018-10-28 – 2018-11-02 (×6): 0.4 mg via ORAL
  Filled 2018-10-28 (×6): qty 1

## 2018-10-28 MED ORDER — METOPROLOL TARTRATE 25 MG PO TABS
37.5000 mg | ORAL_TABLET | Freq: Two times a day (BID) | ORAL | Status: DC
  Administered 2018-10-28 – 2018-11-02 (×10): 37.5 mg via ORAL
  Filled 2018-10-28 (×11): qty 1

## 2018-10-28 NOTE — Plan of Care (Signed)
Pt alert and oriented, VSS, pt afebrile. Pt denies any c/o pain or discomfort. POC reviewed with patient, meds as ordered. Droplet precautions maintained, will continue to monitor.

## 2018-10-28 NOTE — ED Notes (Signed)
ED TO INPATIENT HANDOFF REPORT  ED Nurse Name and Phone #: [phone], Watson RN  S Name/Age/Gender Larry [name] 68 y.o. male Room/Bed: 035C/035C  Code Status   Code Status: Full Code  Home/SNF/Other Home Patient oriented to: self, place, time and situation Is this baseline? Yes   Triage Complete: Triage complete  Chief Complaint leftside head hurt, sore throat not eating  Triage Note Patient endorses headache and sore throat x 3 days. No relief with OTC medications. Denies fevers/chills, cough.    Allergies No Known Allergies  Level of Care/Admitting Diagnosis ED Disposition    ED Disposition Condition [facility] Area: [place] [[id number]]  Level of Care: Telemetry Medical [104]  I expect the patient will be discharged within 24 hours: Yes  LOW acuity---Tx typically complete <24 hrs---ACUTE conditions typically can be evaluated <24 hours---LABS likely to return to acceptable levels <24 hours---IS near functional baseline---EXPECTED to return to current living arrangement---NOT newly hypoxic: Meets criteria for 5C-Observation unit  Covid Evaluation: Person Under Investigation (PUI)  Diagnosis: Pharyngitis [735329]  Admitting Physician: Shela Leff [[id number]]  Attending Physician: Shela Leff [[id number]]  PT Class (Do Not Modify): Observation [104]  PT Acc Code (Do Not Modify): Observation [10022]       B Medical/Surgery History Past Medical History:  Diagnosis Date  . Arthritis   . Atrial tachycardia ([place])    managed on beta blocker therapy  . Childhood asthma    "went away after I was 16"  . Chronic combined systolic and diastolic CHF, NYHA class 3 (HCC)    has diastolic heart failure grade 1; EF is 45 to 50% per echo 05/2011; EF 41% by Myoview 2016  . CKD (chronic kidney disease) stage 3, GFR 30-59 ml/min (HCC)   . Colon cancer ([place]) 2015   MSI high; IHC loss of MLH1 and PMS2; BRAF negative; Negative  methylation  . COPD (chronic obstructive pulmonary disease) ([place])   . Coronary artery disease   . Family history of ovarian cancer   . Hypercholesteremia   . Hypertension   . Noncompliance   . NSVT (nonsustained ventricular tachycardia) (HCC)    beta blocker restarted  . Obesity   . OSA on CPAP    used nightly, pt does not know settings  . Peripheral arterial disease (HCC)    4.2 cm thoracic aortic aneurysm per chest ct 11-14-15 epic  . S/P colostomy ([place])    2014  . Thrombocytopenia ([place])   . Torsades de pointes ([place])    X 2 episodes during hospital visit 12'14"electrolyte imbalance"- "Shocked"   Past Surgical History:  Procedure Laterality Date  . COLON SURGERY    . COLONOSCOPY N/A 02/08/2013   Procedure: COLONOSCOPY;  Surgeon: [name], MD;  Location: [place];  Service: Endoscopy;  Laterality: N/A;  . COLONOSCOPY WITH PROPOFOL N/A 05/09/2014   Procedure: COLONOSCOPY WITH PROPOFOL;  Surgeon: [name], MD;  Location: [facility];  Service: Endoscopy;  Laterality: N/A;  . COLONOSCOPY WITH PROPOFOL N/A 01/08/2016   Procedure: COLONOSCOPY WITH PROPOFOL;  Surgeon: [name], MD;  Location[facility];  Service: Endoscopy;  Laterality: N/A;  . COLOSTOMY N/A 04/19/2016   Procedure: COLOSTOMY;  Surgeon: Judeth Horn, MD;  Location: [place];  Service: General;  Laterality: N/A;  . COLOSTOMY REVERSAL  04/12/2016  . COLOSTOMY REVERSAL N/A 04/12/2016   Procedure: COLOSTOMY REVERSAL;  Surgeon: Judeth Horn, MD;  Location: [place];  Service: General;  Laterality: N/A;  .  CORONARY ANGIOPLASTY WITH STENT PLACEMENT  11/12/2008; 06/11/2014   stent x 2 to RCA; stent x 2 to LAD  . CORONARY ANGIOPLASTY WITH STENT PLACEMENT  06/11/2014   m-LAD 3.5 x 16 mm Synergy DES, d-LAD  2.25 x 16 mm Synergy DES  . CYSTOSCOPY W/ URETERAL STENT PLACEMENT Left 06/30/2017   Procedure: CYSTOSCOPY WITH RETROGRADE PYELOGRAM/URETERAL STENT PLACEMENT;  Surgeon: Lucas Mallow, MD;  Location: [place];   Service: Urology;  Laterality: Left;  . CYSTOSCOPY WITH LITHOLAPAXY N/A 08/02/2017   Procedure: CYSTOSCOPY WITH BLADDER STONE EXTRACTION;  Surgeon: Lucas Mallow, MD;  Location: [facility];  Service: Urology;  Laterality: N/A;  . CYSTOSCOPY WITH RETROGRADE PYELOGRAM, URETEROSCOPY AND STENT PLACEMENT Left 08/02/2017   Procedure: CYSTOSCOPY WITH LEFT RETROGRADE PYELOGRAM, URETEROSCOPY AND STENT EXCHANGE;  Surgeon: Lucas Mallow, MD;  [facility];  Service: Urology;  Laterality: Left;  . ESOPHAGOGASTRODUODENOSCOPY N/A 02/08/2013   Procedure: ESOPHAGOGASTRODUODENOSCOPY (EGD);  Surgeon: [name], MD;  Location: [facility];  Service: Endoscopy;  Laterality: N/A;  . FLEXIBLE SIGMOIDOSCOPY N/A 02/19/2016   Procedure: FLEXIBLE SIGMOIDOSCOPY;  Surgeon: [name], MD;  [facility];  Service: Endoscopy;  Laterality: N/A;  . HOLMIUM LASER APPLICATION Left 7/35/3299   Procedure: HOLMIUM LASER APPLICATION;  Surgeon: Lucas Mallow, MD;  [facility];  Service: Urology;  Laterality: Left;  . LAPAROTOMY N/A 02/12/2013   Procedure: EXPLORATORY LAPAROTOMY PARTIAL COLECTOMY WITH COLOSTOMY;  Surgeon: [name], MD;  Location: [place];  Service: General;  Laterality: N/A;  . LAPAROTOMY N/A 02/18/2013   Procedure: EXPLORATORY LAPAROTOMY/Closure of Wound;  Surgeon: [name], MD;  Location: [place];  Service: General;  Laterality: N/A;  . LAPAROTOMY N/A 04/19/2016   Procedure: EXPLORATORY LAPAROTOMY, REPAIR OF ANASTAMOTIC LEAK;  Surgeon: Judeth Horn, MD;  Location: [place];  Service: General;  Laterality: N/A;  . LEFT HEART CATHETERIZATION WITH CORONARY ANGIOGRAM N/A 06/11/2014   Procedure: LEFT HEART CATHETERIZATION WITH CORONARY ANGIOGRAM;  Surgeon: [name], MD; CFX calcified, 30-40 percent, RCA calcified, 40/50/40%, PDA diffuse disease, LAD 40/75/90% s/p DES 2      A IV Location/Drains/Wounds Patient Lines/Drains/Airways Status   Active Line/Drains/Airways     Name:   Placement date:   Placement time:   Site:   Days:   Peripheral IV 10/27/18 Left Antecubital   10/27/18    1845    Antecubital   1   Ileostomy Standard (end) RLQ   04/19/16    1500    RLQ   922   Urethral Catheter Mell Turner, RN Latex 16 Fr.   07/04/17    1454    Latex   481   Ureteral Drain/Stent Left ureter 6 Fr.   08/02/17    0947    (S) Left ureter   452   Incision (Closed) 04/19/16 Abdomen Other (Comment)   04/19/16    1331     922   Incision (Closed) 06/30/17 Penis   06/30/17    2003     485   Wound / Incision (Open or Dehisced) 06/11/14 Other (Comment)   06/11/14    1035    -   1600          Intake/Output Last 24 hours No intake or output data in the 24 hours ending 10/28/18 0256  Labs/Imaging Results for orders placed or performed during the hospital encounter of 10/27/18 (from the past 48 hour(s))  CBC with Differential  Status: None   Collection Time: 10/27/18  6:47 PM  Result Value Ref Range   WBC 9.4 4.0 - 10.5 K/uL   RBC 4.67 4.22 - 5.81 MIL/uL   Hemoglobin 13.7 13.0 - 17.0 g/dL   HCT 43.4 39.0 - 52.0 %   MCV 92.9 80.0 - 100.0 fL   MCH 29.3 26.0 - 34.0 pg   MCHC 31.6 30.0 - 36.0 g/dL   RDW 13.8 11.5 - 15.5 %   Platelets 240 150 - 400 K/uL   nRBC 0.0 0.0 - 0.2 %   Neutrophils Relative % 79 %   Neutro Abs 7.4 1.7 - 7.7 K/uL   Lymphocytes Relative 10 %   Lymphs Abs 1.0 0.7 - 4.0 K/uL   Monocytes Relative 10 %   Monocytes Absolute 0.9 0.1 - 1.0 K/uL   Eosinophils Relative 1 %   Eosinophils Absolute 0.1 0.0 - 0.5 K/uL   Basophils Relative 0 %   Basophils Absolute 0.0 0.0 - 0.1 K/uL   Immature Granulocytes 0 %   Abs Immature Granulocytes 0.03 0.00 - 0.07 K/uL    Comment: Performed at [facility], [address] [place], [place] [address]  Basic metabolic panel     Status: Abnormal   Collection Time: 10/27/18  6:47 PM  Result Value Ref Range   Sodium 136 135 - 145 mmol/L   Potassium 3.9 3.5 - 5.1 mmol/L   Chloride 99 98 - 111 mmol/L   CO2 22  22 - 32 mmol/L   Glucose, Bld 104 (H) 70 - 99 mg/dL   BUN 27 (H) 8 - 23 mg/dL   Creatinine, Ser 2.77 (H) 0.61 - 1.24 mg/dL   Calcium 10.0 8.9 - 10.3 mg/dL   GFR calc non Af Amer 23 (L) >60 mL/min   GFR calc Af Amer 27 (L) >60 mL/min   Anion gap 15 5 - 15    Comment: Performed at [facility] [address], [place], [place] [address]  Lactic acid, plasma     Status: None   Collection Time: 10/27/18  6:47 PM  Result Value Ref Range   Lactic Acid, Venous 1.5 0.5 - 1.9 mmol/L    Comment: Performed at [facility] [address] [place],  [address]  SARS Coronavirus 2 [facility] order, Performed in [facility] hospital lab) Nasopharyngeal Nasopharyngeal Swab     Status: None   Collection Time: 10/27/18  9:20 PM   Specimen: Nasopharyngeal Swab  Result Value Ref Range   SARS Coronavirus 2 NEGATIVE NEGATIVE    Comment: (NOTE) If result is NEGATIVE SARS-CoV-2 target nucleic acids are NOT DETECTED. The SARS-CoV-2 RNA is generally detectable in upper and lower  respiratory specimens during the acute phase of infection. The lowest  concentration of SARS-CoV-2 viral copies this assay can detect is 250  copies / mL. A negative result does not preclude SARS-CoV-2 infection  and should not be used as the sole basis for treatment or other  patient management decisions.  A negative result may occur with  improper specimen collection / handling, submission of specimen other  than nasopharyngeal swab, presence of viral mutation(s) within the  areas targeted by this assay, and inadequate number of viral copies  (<250 copies / mL). A negative result must be combined with clinical  observations, patient history, and epidemiological information. If result is POSITIVE SARS-CoV-2 target nucleic acids are DETECTED. The SARS-CoV-2 RNA is generally detectable in upper and lower  respiratory specimens  dur ing the acute phase of infection.  Positive  results are indicative of active  infection with SARS-CoV-2.  Clinical  correlation with patient history and other diagnostic information is  necessary to determine patient infection status.  Positive results do  not rule out bacterial infection or co-infection with other viruses. If result is PRESUMPTIVE POSTIVE SARS-CoV-2 nucleic acids MAY BE PRESENT.   A presumptive positive result was obtained on the submitted specimen  and confirmed on repeat testing.  While 2019 novel coronavirus  (SARS-CoV-2) nucleic acids may be present in the submitted sample  additional confirmatory testing may be necessary for epidemiological  and / or clinical management purposes  to differentiate between  SARS-CoV-2 and other Sarbecovirus currently known to infect humans.  If clinically indicated additional testing with an alternate test  methodology [phone]) is advised. The SARS-CoV-2 RNA is generally  detectable in upper and lower respiratory sp ecimens during the acute  phase of infection. The expected result is Negative. Fact Sheet for Patients:  StrictlyIdeas.no Fact Sheet for Healthcare Providers: BankingDealers.co.za This test is not yet approved or cleared by the [place] FDA and has been authorized for detection and/or diagnosis of SARS-CoV-2 by FDA under an Emergency Use Authorization (EUA).  This EUA will remain in effect (meaning this test can be used) for the duration of the COVID-19 declaration under Section 564(b)(1) of the Act, 21 U.S.C. section 360bbb-3(b)(1), unless the authorization is terminated or revoked sooner. Performed at [facility], [place] [address] [place], [place] [address]   Group A Strep by PCR     Status: None   Collection Time: 10/27/18  9:28 PM   Specimen: Throat; Sterile Swab  Result Value Ref Range   Group A Strep by PCR NOT DETECTED NOT DETECTED    Comment: Performed at [facility] [address] [place], [place] [address]  Lactic  acid, plasma     Status: None   Collection Time: 10/27/18 10:15 PM  Result Value Ref Range   Lactic Acid, Venous 1.6 0.5 - 1.9 mmol/L    Comment: Performed at [facility] [address], [place], [place] [address]   Ct Soft Tissue Neck Wo Contrast  Result Date: 10/27/2018 CLINICAL DATA:  Neck pain, infection suspected. Patient states headache and sore throat for 3 days. EXAM: CT NECK WITHOUT CONTRAST TECHNIQUE: Multidetector CT imaging of the neck was performed following the standard protocol without intravenous contrast. COMPARISON:  None. FINDINGS: Pharynx and larynx: No focal mucosal or submucosal lesions are present. The left palatine tonsil is enlarged. No definite abscess is present. There is some stranding of left parapharyngeal fat. Salivary glands: There is some stranding about the left submandibular gland. No duct dilation is evident. The right submandibular gland and bilateral parotid glands are within normal limits. Thyroid: Normal Lymph nodes: Significant cervical adenopathy is present. Small level 2 lymph nodes are present bilaterally. Reactive left submandibular lymph nodes are present. Vascular: Dense atherosclerotic calcifications are present at the carotid bifurcations bilaterally. Additional calcifications are present in the proximal great vessels. Limited intracranial: Intracranial atherosclerotic calcifications are present. No focal parenchymal abnormalities are present. Visualized orbits: Globes and orbits are within normal limits. Mastoids and visualized paranasal sinuses: Mucosal thickening is present along the floor of the left maxillary sinus. There is marked lucency about the residual left maxillary molar tooth with tooth roots protruding into the sinus. Skeleton: Multilevel degenerative changes are present in the cervical spine. No focal lytic or blastic  lesions are present. Upper chest: The lung apices are clear.  Thoracic inlet is normal. IMPRESSION: 1. Asymmetric  enlargement of the left palatine tonsil may related to acute pharyngitis or tonsillitis. No discrete abscess is present. 2. Inflammatory changes about the left submandibular gland extending into the left platysma likely extends from the tonsil. 3. Reactive lymph nodes. 4. Atherosclerotic changes are noted in the proximal great vessels and at the carotid bifurcations bilaterally. 5. Multilevel degenerative changes in the cervical spine. 6. Lucency about the tooth roots of the residual left maxillary molar. The roots extend into the left maxillary sinus with there is mucosal thickening along the floor. This may be related. Electronically Signed   By: San Morelle M.D.   On: 10/27/2018 21:29    Pending Labs Unresulted Labs (From admission, onward)    Start     Ordered   10/28/18 2549  Basic metabolic panel  Tomorrow morning,   R     10/27/18 2308   10/28/18 0054  Respiratory Panel by PCR  (Respiratory virus panel with precautions)  Once,   STAT     10/28/18 0053   10/27/18 2309  HIV Antibody (routine testing w rflx)  Once,   STAT     10/27/18 2308          Vitals/Pain Today's Vitals   10/28/18 0100 10/28/18 0115 10/28/18 0130 10/28/18 0145  BP: (!) 158/97 (!) 157/94 (!) 150/94 (!) 152/96  Pulse: 89 85 86 91  Resp: 12 12 13 13   Temp:      TempSrc:      SpO2: 96% 97% 97% 96%  Weight:      Height:      PainSc:        Isolation Precautions Droplet precaution  Medications Medications  clindamycin (CLEOCIN) IVPB 600 mg (has no administration in time range)  morphine 2 MG/ML injection 1 mg (has no administration in time range)  0.9 %  sodium chloride infusion ( Intravenous New Bag/Given 10/27/18 2333)  enoxaparin (LOVENOX) injection 40 mg (has no administration in time range)  metoprolol tartrate (LOPRESSOR) injection 5 mg (5 mg Intravenous Given 10/28/18 0008)  acetaminophen (TYLENOL) tablet 650 mg (has no administration in time range)    Or  acetaminophen (TYLENOL) suppository  650 mg (has no administration in time range)  sodium chloride 0.9 % bolus 500 mL (0 mLs Intravenous Stopped 10/27/18 2233)  dexamethasone (DECADRON) injection 10 mg (10 mg Intravenous Given 10/27/18 2110)  clindamycin (CLEOCIN) IVPB 600 mg (0 mg Intravenous Stopped 10/27/18 2233)  morphine 4 MG/ML injection 4 mg (4 mg Intravenous Given 10/27/18 2111)  HYDROmorphone (DILAUDID) injection 1 mg (1 mg Intravenous Given 10/27/18 2243)    Mobility walks Low fall risk   Focused Assessments Cardiac Assessment Handoff:    Lab Results  Component Value Date   CKTOTAL 180 06/17/2011   CKMB 1.9 06/17/2011   TROPONINI <0.03 04/23/2016   Lab Results  Component Value Date   DDIMER 0.39 09/30/2013   Does the Patient currently have chest pain? No       R Recommendations: See Admitting Provider Note  Report given to:   Additional Notes:

## 2018-10-28 NOTE — Evaluation (Signed)
Clinical/Bedside Swallow Evaluation Patient Details  Name: Larry [name] MRN: [id number] Date of Birth: 21-Nov-1950  Today's Date: 10/28/2018 Time: SLP Start Time (ACUTE ONLY): 1217 SLP Stop Time (ACUTE ONLY): 1225 SLP Time Calculation (min) (ACUTE ONLY): 8 min  Past Medical History:  Past Medical History:  Diagnosis Date  . Arthritis   . Atrial tachycardia ([place])    managed on beta blocker therapy  . Childhood asthma    "went away after I was 16"  . Chronic combined systolic and diastolic CHF, NYHA class 3 (HCC)    has diastolic heart failure grade 1; EF is 45 to 50% per echo 05/2011; EF 41% by Myoview 2016  . CKD (chronic kidney disease) stage 3, GFR 30-59 ml/min (HCC)   . Colon cancer ([place]) 2015   MSI high; IHC loss of MLH1 and PMS2; BRAF negative; Negative methylation  . COPD (chronic obstructive pulmonary disease) ([place])   . Coronary artery disease   . Family history of ovarian cancer   . Hypercholesteremia   . Hypertension   . Noncompliance   . NSVT (nonsustained ventricular tachycardia) (HCC)    beta blocker restarted  . Obesity   . OSA on CPAP    used nightly, pt does not know settings  . Peripheral arterial disease (HCC)    4.2 cm thoracic aortic aneurysm per chest ct 11-14-15 epic  . S/P colostomy ([place])    2014  . Thrombocytopenia ([place])   . Torsades de pointes ([place])    X 2 episodes during hospital visit 12'14"electrolyte imbalance"- "Shocked"   Past Surgical History:  Past Surgical History:  Procedure Laterality Date  . COLON SURGERY    . COLONOSCOPY N/A 02/08/2013   Procedure: COLONOSCOPY;  Surgeon: [name], MD;  Location: [place];  Service: Endoscopy;  Laterality: N/A;  . COLONOSCOPY WITH PROPOFOL N/A 05/09/2014   Procedure: COLONOSCOPY WITH PROPOFOL;  Surgeon: [name], MD;  Location[facility];  Service: Endoscopy;  Laterality: N/A;  . COLONOSCOPY WITH PROPOFOL N/A 01/08/2016   Procedure: COLONOSCOPY WITH PROPOFOL;  Surgeon: [name], MD;  [facility];  Service: Endoscopy;  Laterality: N/A;  . COLOSTOMY N/A 04/19/2016   Procedure: COLOSTOMY;  Surgeon: Judeth Horn, MD;  Location: [place];  Service: General;  Laterality: N/A;  . COLOSTOMY REVERSAL  04/12/2016  . COLOSTOMY REVERSAL N/A 04/12/2016   Procedure: COLOSTOMY REVERSAL;  Surgeon: Judeth Horn, MD;  Location: [place];  Service: General;  Laterality: N/A;  . CORONARY ANGIOPLASTY WITH STENT PLACEMENT  11/12/2008; 06/11/2014   stent x 2 to RCA; stent x 2 to LAD  . CORONARY ANGIOPLASTY WITH STENT PLACEMENT  06/11/2014   m-LAD 3.5 x 16 mm Synergy DES, d-LAD  2.25 x 16 mm Synergy DES  . CYSTOSCOPY W/ URETERAL STENT PLACEMENT Left 06/30/2017   Procedure: CYSTOSCOPY WITH RETROGRADE PYELOGRAM/URETERAL STENT PLACEMENT;  Surgeon: Lucas Mallow, MD;  Location: [place];  Service: Urology;  Laterality: Left;  . CYSTOSCOPY WITH LITHOLAPAXY N/A 08/02/2017   Procedure: CYSTOSCOPY WITH BLADDER STONE EXTRACTION;  Surgeon: Lucas Mallow, MD;  Location: [facility];  Service: Urology;  Laterality: N/A;  . CYSTOSCOPY WITH RETROGRADE PYELOGRAM, URETEROSCOPY AND STENT PLACEMENT Left 08/02/2017   Procedure: CYSTOSCOPY WITH LEFT RETROGRADE PYELOGRAM, URETEROSCOPY AND STENT EXCHANGE;  Surgeon: Lucas Mallow, MD;  [facility];  Service: Urology;  Laterality: Left;  . ESOPHAGOGASTRODUODENOSCOPY N/A 02/08/2013   Procedure: ESOPHAGOGASTRODUODENOSCOPY (EGD);  Surgeon: [name], MD;  Location: [place];  Service: Endoscopy;  Laterality: N/A;  . FLEXIBLE SIGMOIDOSCOPY N/A 02/19/2016   Procedure: FLEXIBLE SIGMOIDOSCOPY;  Surgeon: [name], MD;  [facility];  Service: Endoscopy;  Laterality: N/A;  . HOLMIUM LASER APPLICATION Left 8/63/8177   Procedure: HOLMIUM LASER APPLICATION;  Surgeon: Lucas Mallow, MD;  [facility];  Service: Urology;  Laterality: Left;  . LAPAROTOMY N/A 02/12/2013   Procedure: EXPLORATORY LAPAROTOMY PARTIAL COLECTOMY WITH  COLOSTOMY;  Surgeon: [name], MD;  Location: [place];  Service: General;  Laterality: N/A;  . LAPAROTOMY N/A 02/18/2013   Procedure: EXPLORATORY LAPAROTOMY/Closure of Wound;  Surgeon: [name], MD;  Location: [place];  Service: General;  Laterality: N/A;  . LAPAROTOMY N/A 04/19/2016   Procedure: EXPLORATORY LAPAROTOMY, REPAIR OF ANASTAMOTIC LEAK;  Surgeon: Judeth Horn, MD;  Location: [place];  Service: General;  Laterality: N/A;  . LEFT HEART CATHETERIZATION WITH CORONARY ANGIOGRAM N/A 06/11/2014   Procedure: LEFT HEART CATHETERIZATION WITH CORONARY ANGIOGRAM;  Surgeon: [name], MD; CFX calcified, 30-40 percent, RCA calcified, 40/50/40%, PDA diffuse disease, LAD 40/75/90% s/p DES 2    HPI:  Larry [name] is a 68 y.o. male with medical history significant of atrial tachycardia, NSVT, torsades, chronic combined systolic and diastolic congestive heart failure, CKD stage III, COPD, CAD status post PCI, hypertension, hyperlipidemia, OSA on CPAP, PAD, history of colon cancer status post colostomy, and conditions listed below presenting to the hospital for evaluation of sore throat.  Patient states for the past 1 week he has had a sore throat and it is not getting better. He can barely open his mouth and has not been able to eat for the past 3 days.  MD unable to assess oropharynx secondary to trismus/not being able to fully open his mouth. CT soft tissue neck showed asymmetric enlargement of the left palatine tonsil suspicious for acute pharyngitis or tonsillitis.  No discrete abscess present.  Inflammatory changes about the left submandibular gland extending into the left platysma likely extending from the tonsil.  Reactive lymph nodes. CT showing lucency about the tooth roots of the residual left maxillary molar. The roots extend into the left maxillary sinus where there is mucosal thickening along the floor.  Possibly related to acute pharyngitis/tonsillitis mentioned above.     Assessment  / Plan / Recommendation Clinical Impression  Pts oral function is adequate except for trismus/limited mandibular movement due to pain. He is able to drink from a cup and straw without difficulty, vocal quality dry and no coughing observed. Pt also able to open mouth for a spoon and orally manipulate applesauce. He refuses to attempt chewing solids yet due to pain. Recommend advancing diet to puree with thin liquids of choice. WIll f/u for potential diet advancement.  SLP Visit Diagnosis: Dysphagia, oral phase (R13.11)    Aspiration Risk  Mild aspiration risk    Diet Recommendation Dysphagia 1 (Puree);Thin liquid   Liquid Administration via: Cup;Straw;Spoon Medication Administration: Whole meds with liquid Supervision: Patient able to self feed Compensations: Slow rate;Small sips/bites Postural Changes: Seated upright at 90 degrees    Other  Recommendations     Follow up Recommendations None      Frequency and Duration min 2x/week  2 weeks       Prognosis Prognosis for Safe Diet Advancement: Good      Swallow Study   General HPI: Larry [name] is a 68 y.o. male with medical history significant of atrial tachycardia, NSVT, torsades, chronic combined systolic and diastolic congestive  heart failure, CKD stage III, COPD, CAD status post PCI, hypertension, hyperlipidemia, OSA on CPAP, PAD, history of colon cancer status post colostomy, and conditions listed below presenting to the hospital for evaluation of sore throat.  Patient states for the past 1 week he has had a sore throat and it is not getting better. He can barely open his mouth and has not been able to eat for the past 3 days.  MD unable to assess oropharynx secondary to trismus/not being able to fully open his mouth. CT soft tissue neck showed asymmetric enlargement of the left palatine tonsil suspicious for acute pharyngitis or tonsillitis.  No discrete abscess present.  Inflammatory changes about the left submandibular  gland extending into the left platysma likely extending from the tonsil.  Reactive lymph nodes. CT showing lucency about the tooth roots of the residual left maxillary molar. The roots extend into the left maxillary sinus where there is mucosal thickening along the floor.  Possibly related to acute pharyngitis/tonsillitis mentioned above.   Type of Study: Bedside Swallow Evaluation Previous Swallow Assessment: none Temperature Spikes Noted: No Respiratory Status: Room air History of Recent Intubation: No Behavior/Cognition: Alert;Cooperative;Pleasant mood Oral Cavity Assessment: Within Functional Limits Oral Care Completed by SLP: No Oral Cavity - Dentition: Adequate natural dentition Patient Positioning: Upright in bed Baseline Vocal Quality: Normal Volitional Cough: Strong Volitional Swallow: Able to elicit    Oral/Motor/Sensory Function Overall Oral Motor/Sensory Function: Mild impairment Facial ROM: Within Functional Limits Facial Symmetry: Within Functional Limits Facial Strength: Within Functional Limits Facial Sensation: Within Functional Limits Lingual ROM: Within Functional Limits Lingual Symmetry: Within Functional Limits Lingual Strength: Within Functional Limits Lingual Sensation: Within Functional Limits Velum: Within Functional Limits Mandible: Impaired(opens about 2 cm. spoon can pass. limited by pain)   Ice Chips     Thin Liquid Thin Liquid: Within functional limits Presentation: Cup;Straw    Nectar Thick Nectar Thick Liquid: Not tested   Honey Thick Honey Thick Liquid: Not tested   Puree Puree: Within functional limits Presentation: Spoon   Solid     Solid: Not tested(pt does not want to attempt chewing yet due to pain)     Herbie Baltimore, MA [place] Pager [phone] Office [phone]  Lynann Beaver 10/28/2018,12:28 PM

## 2018-10-28 NOTE — Progress Notes (Addendum)
PROGRESS NOTE  Larry [name] [id number] DOB: 19-Feb-1951 DOA: 10/27/2018 PCP: Lin Landsman, MD  Brief History   68 year old man presented to the emergency department for sore throat for 1 week.  CT soft tissue neck showed asymmetric enlargement left tonsil with inflammatory changes left submandibular gland extending to the left platysma.  Admitted for acute pharyngitis/tonsillitis.  A & P  Acute pharyngitis/tonsillitis with associated inflammatory changes left submandibular gland extending to the left platysma.  Group A strep PCR negative. --Still has trismus but able to handle secretions, swallows effectively, respiratory status stable.  Patient reports significant improvement of about 50%. --Continue liquid diet, advance to dysphagia 1 diet, thin liquids. --Continue IV clindamycin.  AKI superimposed on CKD stage III --Improving.  Secondary to poor oral intake secondary to acute infection.  Continue IV fluids.  Check BMP in a.m.  Lucency about two thirds residual left maxillary molar with mucosal thickening of the maxillary sinus. --Outpatient follow-up with dental surgery.  Chronic diastolic CHF, coronary artery disease status post stent placement.  Echo 2018 showed LVEF 55-60% with no wall motion abnormalities. --Appears stable.  PMH NSVT --Continue beta-blocker  . Somewhat improved today but still has significant jaw swelling, pain and trismus.  Continue IV antibiotics.  Resolved Hospital Problem list       DVT prophylaxis: enoxaparin Code Status: Full Family Communication: none Disposition Plan: home    Murray Hodgkins, MD  [facility] Direct contact: see [email or web address] (further directions at bottom of note if needed) 7PM-7AM contact night coverage as at bottom of note 10/28/2018, 2:21 PM  LOS: 0 days   Significant Hospital Events   .    Consults:  .    Procedures:  .   Significant Diagnostic Tests:  . 9/5 CT neck soft tissue: Enlargement left  palatine tonsil may be related to acute pharyngitis or tonsillitis.  No abscess present.  Inflammatory changes left submandibular gland extending to left platysma.  Lucency about tooth roots residual left maxillary molar.   Micro Data:  . 9/5 SARS-CoV-2 negative . 9/5 group A strep not detected . 9/6 respiratory viral panel negative   Antimicrobials:  .   Interval History/Subjective  Feels a lot better.  Swelling on the left side is about 50% improved.  No difficulty swallowing although does have some pain with swallowing.  Breathing fine.  Tolerating liquids.  No previous infections, problems with tonsils.  He has been aware of the need for dental work.  Objective   Vitals:  Vitals:   10/28/18 0700 10/28/18 0748  BP:  112/72  Pulse:  86  Resp: 12 18  Temp:  98.4 F (36.9 C)  SpO2: 98% 96%    Exam:  Constitutional:  . Appears calm and comfortable ENMT:  . grossly normal hearing  . Lips appear normal . He is able to open his mouth about perhaps a centimeter.  Visualized portion of his tongue appears unremarkable.  I am unable to visualize the hard or soft palate or buccal mucosa. Neck:  . There is significant swelling along the left side of the jaw, this is nontender to palpation and fairly soft.  No fluctuance noted. Respiratory:  . CTA bilaterally, no w/r/r.  . Respiratory effort normal.  Cardiovascular:  . RRR, no m/r/g . Telemetry sinus rhythm . No LE extremity edema   Psychiatric:  . Mental status o Mood, affect appropriate . judgment and insight appear intact    I have personally reviewed the following:   Today's Data  .  BUN 27, creatinine 2.37.  Potassium 4.1.   Scheduled Meds: . [START ON 10/29/2018] aspirin EC  81 mg Oral Daily  . clopidogrel  75 mg Oral Daily  . diltiazem  90 mg Oral TID  . enoxaparin (LOVENOX) injection  40 mg Subcutaneous Q24H  . metoprolol tartrate  37.5 mg Oral BID  . tamsulosin  0.4 mg Oral Daily   Continuous Infusions: .  sodium chloride    . clindamycin (CLEOCIN) IV 600 mg (10/28/18 1335)    Principal Problem:   Pharyngitis Active Problems:   Essential hypertension   CKD (chronic kidney disease), stage III (HCC)   Chronic diastolic CHF (congestive heart failure) (HCC)   Acute kidney injury superimposed on chronic kidney disease III ([place])   Poor dentition   Tonsillitis   LOS: 0 days   How to contact the [facility] Attending or Consulting provider [place] or covering provider during after hours [place], for this patient?  1. Check the care team in [facility] and look for a) attending/consulting TRH provider listed and b) the [facility] team listed 2. Log into [email or web address] and use [place]'s universal password to access. If you do not have the password, please contact the hospital operator. 3. Locate the [facility] provider you are looking for under [facility] and page to a number that you can be directly reached. 4. If you still have difficulty reaching the provider, please page the [facility] (Director on Call) for the Hospitalists listed on amion for assistance.

## 2018-10-29 LAB — BASIC METABOLIC PANEL
Anion gap: 12 (ref 5–15)
BUN: 26 mg/dL — ABNORMAL HIGH (ref 8–23)
CO2: 21 mmol/L — ABNORMAL LOW (ref 22–32)
Calcium: 9.2 mg/dL (ref 8.9–10.3)
Chloride: 105 mmol/L (ref 98–111)
Creatinine, Ser: 2.02 mg/dL — ABNORMAL HIGH (ref 0.61–1.24)
GFR calc Af Amer: 39 mL/min — ABNORMAL LOW (ref >60)
GFR calc non Af Amer: 34 mL/min — ABNORMAL LOW (ref >60)
Glucose, Bld: 116 mg/dL — ABNORMAL HIGH (ref 70–99)
Potassium: 4.2 mmol/L (ref 3.5–5.1)
Sodium: 138 mmol/L (ref 135–145)

## 2018-10-29 MED ORDER — ENOXAPARIN SODIUM 80 MG/0.8ML ~~LOC~~ SOLN
75.0000 mg | SUBCUTANEOUS | Status: DC
  Administered 2018-10-30: 75 mg via SUBCUTANEOUS
  Filled 2018-10-29: qty 0.8

## 2018-10-29 NOTE — Consult Note (Signed)
[facility] Nurse ostomy consult note Stoma type/location: RLQ colostomy.   Stomal assessment/size: 2" pink patent and producing soft brown stool. Peristomal assessment: intact   Pouch is in place and patient states it is fine. He manages his ostomy care.  He has supplies at bedside if needed. I will order additional for care while inpatient.  Treatment options for stomal/peristomal skin: barrier ring  2 piece pouch 2 3/4"  Patient window panes the pouch with tape for extra layer of security.  Output soft brown Ostomy pouching: 2pc. 2 3/4"  Education provided: NOne needed.  Enrolled patient in [place] program: No Will not follow at this time.  Please re-consult if needed.  Domenic Moras MSN, RN, FNP-BC [facility] Wound, Ostomy, Continence Nurse Pager [phone]

## 2018-10-29 NOTE — Progress Notes (Signed)
PROGRESS NOTE  Larry [name] [id number] DOB: 1950/04/19 DOA: 10/27/2018 PCP: Lin Landsman, MD  Brief History   68 year old man presented to the emergency department for sore throat for 1 week.  CT soft tissue neck showed asymmetric enlargement left tonsil with inflammatory changes left submandibular gland extending to the left platysma.  Admitted for acute pharyngitis/tonsillitis.  A & P  Acute pharyngitis/tonsillitis with associated inflammatory changes left submandibular gland extending to the left platysma.  Group A strep PCR negative. --Appears about the same today on exam.  Still has trismus but no difficulty swallowing, tolerating soft diet, handling secretions and breathing well.  No fluctuance on exam or evidence of abscess.  Continue to monitor closely.  Reevaluate in a.m.  Do not see any role for surgery at this point. --Continue dysphagia 1 diet, thin liquids. --Continue IV clindamycin.  AKI superimposed on CKD stage III --Presumably secondary to poor oral intake.  Appears to be very close to baseline at this point.  Stop fluids.  Hold Lasix and spironolactone today.  Lucency about two thirds residual left maxillary molar with mucosal thickening of the maxillary sinus. --Outpatient follow-up with dental surgery.  Chronic diastolic CHF, coronary artery disease status post stent placement.  Echo 2018 showed LVEF 55-60% with no wall motion abnormalities. --Mild lower extremity edema.  Monitor volume status closely which is currently balanced.  May need to restart diuretic 9/8.  Hold Lasix and spironolactone today.  PMH NSVT --Continue beta-blocker  . Appears about the same today, continue antibiotics, follow closely.  Discussed with nursing.  Notify MD for difficulty swallowing or breathing.  Resolved Hospital Problem list       DVT prophylaxis: enoxaparin Code Status: Full Family Communication: Wife updated at bedside Disposition Plan: home    Murray Hodgkins,  MD  [facility] Direct contact: see [email or web address] (further directions at bottom of note if needed) 7PM-7AM contact night coverage as at bottom of note 10/29/2018, 3:01 PM  LOS: 1 day   Significant Hospital Events   .    Consults:  .    Procedures:  .   Significant Diagnostic Tests:  . 9/5 CT neck soft tissue: Enlargement left palatine tonsil may be related to acute pharyngitis or tonsillitis.  No abscess present.  Inflammatory changes left submandibular gland extending to left platysma.  Lucency about tooth roots residual left maxillary molar.   Micro Data:  . 9/5 SARS-CoV-2 negative . 9/5 group A strep not detected . 9/6 respiratory viral panel negative   Antimicrobials:  .   Interval History/Subjective  Does not feel as good today.  More pain under his left jaw.  Area under her jaw seems to be harder today.  No difficulty swallowing.  Tolerating soft diet.  No trouble breathing.  Objective   Vitals:  Vitals:   10/29/18 0728 10/29/18 1435  BP: 119/85 124/72  Pulse: 86   Resp: 18   Temp: 98.1 F (36.7 C)   SpO2: 93%     Exam:  Constitutional:   . Appears calm, mildly uncomfortable, nontoxic. Eyes:  [name] Kitchen Eyes appear grossly normal ENMT:  . grossly normal hearing  . Lips appear normal . Able to open mouth about a centimeter.  Visualized dentition and tongue appear unremarkable.  Cannot visualize hard or soft.  Or size of the mouth or buccal mucosa. Neck:  . There is swelling of the left side of the neck, below the jawline.  Not tender to palpation and there is no fluctuance.  There is  an area either of induration or an enlarged lymph node submandibular area.  There is no cellulitis or drainage noted. Respiratory:  . CTA bilaterally, no w/r/r.  . Respiratory effort normal.  Cardiovascular:  . RRR, no m/r/g . Chronic bilateral lower extremity edema noted. Skin:  . Chronic venous stasis changes bilaterally. Psychiatric:  . Mental status o Mood, affect  appropriate  I have personally reviewed the following:   Today's Data  . Urine output 1100.  I/O balanced. . BUN stable at 26.  Creatinine improved 2.02.  Potassium 4.2.   Scheduled Meds: . aspirin EC  81 mg Oral Daily  . clopidogrel  75 mg Oral Daily  . diltiazem  90 mg Oral TID  . [START ON 10/30/2018] enoxaparin (LOVENOX) injection  75 mg Subcutaneous Q24H  . hydrocerin   Topical BID  . metoprolol tartrate  37.5 mg Oral BID  . tamsulosin  0.4 mg Oral Daily   Continuous Infusions: . clindamycin (CLEOCIN) IV 100 mL/hr at 10/29/18 1434    Principal Problem:   Pharyngitis Active Problems:   Essential hypertension   CKD (chronic kidney disease), stage III (HCC)   Chronic diastolic CHF (congestive heart failure) (HCC)   Acute kidney injury superimposed on chronic kidney disease III ([place])   Poor dentition   Tonsillitis   LOS: 1 day   How to contact the [facility] Attending or Consulting provider 7A - 7P or covering provider during after hours [place], for this patient?  1. Check the care team in [facility] and look for a) attending/consulting TRH provider listed and b) the [facility] team listed 2. Log into [email or web address] and use [place]'s universal password to access. If you do not have the password, please contact the hospital operator. 3. Locate the [facility] provider you are looking for under [facility] and page to a number that you can be directly reached. 4. If you still have difficulty reaching the provider, please page the [facility] (Director on Call) for the Hospitalists listed on amion for assistance.

## 2018-10-30 ENCOUNTER — Inpatient Hospital Stay (HOSPITAL_COMMUNITY): Payer: Medicare Other

## 2018-10-30 MED ORDER — SODIUM CHLORIDE 0.9 % IV SOLN
2.0000 g | INTRAVENOUS | Status: DC
  Administered 2018-10-30 – 2018-10-31 (×2): 2 g via INTRAVENOUS
  Filled 2018-10-30 (×2): qty 2

## 2018-10-30 MED ORDER — DEXAMETHASONE SODIUM PHOSPHATE 10 MG/ML IJ SOLN
10.0000 mg | Freq: Three times a day (TID) | INTRAMUSCULAR | Status: AC
  Administered 2018-10-30 – 2018-10-31 (×3): 10 mg via INTRAVENOUS
  Filled 2018-10-30 (×3): qty 1

## 2018-10-30 MED ORDER — METRONIDAZOLE IN NACL 5-0.79 MG/ML-% IV SOLN
500.0000 mg | Freq: Three times a day (TID) | INTRAVENOUS | Status: DC
  Administered 2018-10-30 – 2018-10-31 (×4): 500 mg via INTRAVENOUS
  Filled 2018-10-30 (×4): qty 100

## 2018-10-30 NOTE — Progress Notes (Signed)
Notified by XR unable to do Orthogram after multiple attempts r/t pt's shoulders being too broad. Msg sent to Dr.Schorr to see what she wanted to do.

## 2018-10-30 NOTE — Evaluation (Signed)
Physical Therapy Evaluation Patient Details Name: Larry [name] MRN: [id number] DOB: June 08, 1950 Today's Date: 10/30/2018   History of Present Illness  Pt adm with Acute pharyngitis/tonsillitis and trismus. PMH - cad, ckd, htn, colon CA with colostomy, sleep apnea, obesity  Clinical Impression  Pt admitted with above diagnosis and presents to PT with functional limitations due to deficits listed below (See PT problem list). Pt needs skilled PT to maximize independence and safety to allow discharge to home. Will follow for a session or so to make sure pt is at baseline with mobility but do not feel he will need PT at DC.      Follow Up Recommendations No PT follow up    Equipment Recommendations  None recommended by PT    Recommendations for Other Services       Precautions / Restrictions Precautions Precautions: None      Mobility  Bed Mobility Overal bed mobility: Modified Independent                Transfers Overall transfer level: Needs assistance Equipment used: None Transfers: Sit to/from Stand Sit to Stand: Supervision            Ambulation/Gait Ambulation/Gait assistance: Supervision Gait Distance (Feet): 200 Feet Assistive device: None Gait Pattern/deviations: Step-through pattern;Decreased stride length;Wide base of support Gait velocity: decr Gait velocity interpretation: 1.31 - 2.62 ft/sec, indicative of limited community ambulator General Gait Details: Pt with slightly slowed gait but no loss of balance  Stairs            Wheelchair Mobility    Modified Rankin (Stroke Patients Only)       Balance Overall balance assessment: Mild deficits observed, not formally tested                                           Pertinent Vitals/Pain Pain Assessment: Faces Faces Pain Scale: Hurts little more Pain Location: jaw/throat Pain Intervention(s): Limited activity within patient's tolerance    Home Living  Family/patient expects to be discharged to:: Private residence Living Arrangements: Spouse/significant other Available Help at Discharge: Family;Available 24 hours/day Type of Home: House Home Access: Level entry     Home Layout: One level Home Equipment: Walker - 4 wheels      Prior Function Level of Independence: Independent               Hand Dominance   Dominant Hand: Right    Extremity/Trunk Assessment   Upper Extremity Assessment Upper Extremity Assessment: Overall WFL for tasks assessed    Lower Extremity Assessment Lower Extremity Assessment: Overall WFL for tasks assessed       Communication   Communication: No difficulties  Cognition Arousal/Alertness: Awake/alert Behavior During Therapy: WFL for tasks assessed/performed Overall Cognitive Status: Within Functional Limits for tasks assessed                                        General Comments      Exercises     Assessment/Plan    PT Assessment Patient needs continued PT services  PT Problem List Decreased mobility;Obesity       PT Treatment Interventions Gait training;Functional mobility training;Therapeutic activities;Therapeutic exercise;Patient/family education    PT Goals (Current goals can be found in the Care Plan section)  Acute  Rehab PT Goals Patient Stated Goal: return home PT Goal Formulation: With patient Time For Goal Achievement: 11/06/18 Potential to Achieve Goals: Good    Frequency Min 3X/week   Barriers to discharge        Co-evaluation               AM-PAC PT "6 Clicks" Mobility  Outcome Measure Help needed turning from your back to your side while in a flat bed without using bedrails?: None Help needed moving from lying on your back to sitting on the side of a flat bed without using bedrails?: None Help needed moving to and from a bed to a chair (including a wheelchair)?: None Help needed standing up from a chair using your arms (e.g.,  wheelchair or bedside chair)?: None Help needed to walk in hospital room?: A Little Help needed climbing 3-5 steps with a railing? : A Little 6 Click Score: 22    End of Session   Activity Tolerance: Patient tolerated treatment well Patient left: in chair;with call bell/phone within reach   PT Visit Diagnosis: Other abnormalities of gait and mobility (R26.89)    Time: [id number] PT Time Calculation (min) (ACUTE ONLY): 11 min   Charges:   PT Evaluation $PT Eval Low Complexity: 1 Low          [place] Pager [phone] Office [place] 10/30/2018, 4:48 PM

## 2018-10-30 NOTE — Progress Notes (Signed)
Order received from Dr.Schorr to [place]. XR notified. Msg sent to Dr.Schorr per orders from Dr.Daniels to notify her when Neck CT was done.

## 2018-10-30 NOTE — Progress Notes (Signed)
  Speech Language Pathology Treatment: Dysphagia  Patient Details Name: Larry [name] MRN: [id number] DOB: 1950/03/21 Today's Date: 10/30/2018 Time: 5947-0761 SLP Time Calculation (min) (ACUTE ONLY): 9 min  Assessment / Plan / Recommendation Clinical Impression  F/u after initial swallow evaluation two days ago due to pharyngitis, trismus.  Pt reports minimal improvements.  He continues on a dysphagia 1 diet with thin liquids; appears to be protecting airway.  He prefers to remain on purees now due to limited jaw mobility and pain. No pharyngeal dysphagia.  No further acute care SLP f/u is needed.  Recommended diet consistency be advanced per pt's discretion as mandibular mobility improves and pain decreases.  Pt, RN agree. SLP will sign off.   HPI HPI: Larry [name] is a 68 y.o. male with medical history significant of atrial tachycardia, NSVT, torsades, chronic combined systolic and diastolic congestive heart failure, CKD stage III, COPD, CAD status post PCI, hypertension, hyperlipidemia, OSA on CPAP, PAD, history of colon cancer status post colostomy, and conditions listed below presenting to the hospital for evaluation of sore throat.  Patient states for the past 1 week he has had a sore throat and it is not getting better. He can barely open his mouth and has not been able to eat for the past 3 days.  MD unable to assess oropharynx secondary to trismus/not being able to fully open his mouth. CT soft tissue neck showed asymmetric enlargement of the left palatine tonsil suspicious for acute pharyngitis or tonsillitis.  No discrete abscess present.  Inflammatory changes about the left submandibular gland extending into the left platysma likely extending from the tonsil.  Reactive lymph nodes. CT showing lucency about the tooth roots of the residual left maxillary molar. The roots extend into the left maxillary sinus where there is mucosal thickening along the floor.  Possibly related to acute  pharyngitis/tonsillitis mentioned above.        SLP Plan  All goals met;Discharge SLP treatment due to (comment)       Recommendations  Diet recommendations: Dysphagia 1 (puree);Thin liquid Liquids provided via: Cup;Straw Medication Administration: Whole meds with liquid Supervision: Patient able to self feed Compensations: Slow rate;Small sips/bites                Follow up Recommendations: None SLP Visit Diagnosis: Dysphagia, oral phase (R13.11) Plan: All goals met;Discharge SLP treatment due to (comment)       GO                [name] Laurice 10/30/2018, 9:27 AM  Estill Bamberg L. Tivis Ringer, [place] Office number [phone] Pager [phone]

## 2018-10-30 NOTE — Progress Notes (Addendum)
PROGRESS NOTE  Larry [name] [id number] DOB: 1950-06-01 DOA: 10/27/2018 PCP: Lin Landsman, MD  Brief History   68 year old man presented to the emergency department for sore throat for 1 week.  CT soft tissue neck showed asymmetric enlargement left tonsil with inflammatory changes left submandibular gland extending to the left platysma.  Admitted for acute pharyngitis/tonsillitis.  A & P  Acute pharyngitis/tonsillitis with associated inflammatory changes left submandibular gland extending to the left platysma.  Group A strep PCR negative. --No significant change on exam.  Still has significant trismus and induration and swelling left side of the neck and submandibular.   --Afebrile.  Continue antibiotics and check CBC in a.m. --Minimal lack of improvement on Clinda, will change to ceftriaxone and Flagyl.  Check dental pantogram. --Given lack of clinical improvement, I am worried about developing abscess.  ENT consulted.  Requested to repeat CT neck.  AKI superimposed on CKD stage III --Check BMP in a.m.  Can probably resume Lasix and spironolactone tomorrow.  Lucency about two thirds residual left maxillary molar with mucosal thickening of the maxillary sinus. --Outpatient follow-up with dental surgery recommended, this was discussed with the patient.  Chronic diastolic CHF, coronary artery disease status post stent placement.  Echo 2018 showed LVEF 55-60% with no wall motion abnormalities. --Probably chronic lower extremity edema by appearance.  Probably can resume diuretics tomorrow.  PMH NSVT --Continue beta-blocker  . Given lack of significant improvement, continue IV antibiotics, no opportunity to discharge yet.  ENT consulted.  Resolved Hospital Problem list       DVT prophylaxis: enoxaparin Code Status: Full Family Communication: Wife updated at bedside Disposition Plan: home    Murray Hodgkins, MD  [facility] Direct contact: see [email or web address] (further  directions at bottom of note if needed) 7PM-7AM contact night coverage as at bottom of note 10/30/2018, 2:58 PM  LOS: 2 days   Significant Hospital Events   .    Consults:  .    Procedures:  .   Significant Diagnostic Tests:  . 9/5 CT neck soft tissue: Enlargement left palatine tonsil may be related to acute pharyngitis or tonsillitis.  No abscess present.  Inflammatory changes left submandibular gland extending to left platysma.  Lucency about tooth roots residual left maxillary molar.   Micro Data:  . 9/5 SARS-CoV-2 negative . 9/5 group A strep not detected . 9/6 respiratory viral panel negative   Antimicrobials:  .   Interval History/Subjective  No significant improvement.  Unable to open mouth any further.  Tolerating diet but still has significant left-sided neck swelling.  Does not feel improved.  Able to speak without difficulty.  No trouble swallowing.  No difficulty breathing.  Objective   Vitals:  Vitals:   10/29/18 2209 10/30/18 0709  BP: 128/79 118/76  Pulse: 85 85  Resp: 20   Temp: 98.6 F (37 C) 98.2 F (36.8 C)  SpO2: 97% 95%    Exam:  Constitutional:   . Appears calm, uncomfortable, nontoxic. ENMT:  . grossly normal hearing  . Lips appear normal . Able to open mouth about 2 cm.  Visualized dentition and tongue appear unremarkable but exam is very limited. Neck:  . Still has significant left-sided neck swelling with induration submandibular, no definite fluctuance, mildly tender. Respiratory:  . CTA bilaterally, no w/r/r.  . Respiratory effort normal.  Cardiovascular:  . RRR, no m/r/g . No change in bilateral 2+ LE extremity edema   Psychiatric:  . Mental status o Mood, affect appropriate  I  have personally reviewed the following:   Today's Data  . Urine output 1600. [name] Kitchen No new data.   Scheduled Meds: . aspirin EC  81 mg Oral Daily  . clopidogrel  75 mg Oral Daily  . diltiazem  90 mg Oral TID  . enoxaparin (LOVENOX) injection  75 mg  Subcutaneous Q24H  . hydrocerin   Topical BID  . metoprolol tartrate  37.5 mg Oral BID  . tamsulosin  0.4 mg Oral Daily   Continuous Infusions: . clindamycin (CLEOCIN) IV 600 mg (10/30/18 1436)    Principal Problem:   Pharyngitis Active Problems:   Essential hypertension   CKD (chronic kidney disease), stage III (HCC)   Chronic diastolic CHF (congestive heart failure) (HCC)   Acute kidney injury superimposed on chronic kidney disease III ([place])   Poor dentition   Tonsillitis   LOS: 2 days   How to contact the [facility] Attending or Consulting provider [place] or covering provider during after hours [place], for this patient?  1. Check the care team in [facility] and look for a) attending/consulting [facility] provider listed and b) the [facility] team listed 2. Log into [email or web address] and use [place]'s universal password to access. If you do not have the password, please contact the hospital operator. 3. Locate the [facility] provider you are looking for under [facility] and page to a number that you can be directly reached. 4. If you still have difficulty reaching the provider, please page the [facility] (Director on Call) for the Hospitalists listed on amion for assistance.   Time greater than 35 minutes, greater than 50% counseling coordination of care, discussion with ENT, ID, patient, formulation plan

## 2018-10-30 NOTE — Consult Note (Signed)
OTOLARYNGOLOGY CONSULTATION    Primary Care Physician: Lin Landsman, MD Patient Location at Initial Consult: Inpatient Chief Complaint/Reason for Consult: tonsillitis  History of Presenting Illness:    Larry [name] is a  68 y.o. male presenting with  Left sided throat pain for one week. There is associated ear pain and moderate trismus. Tolerating secretions. Nontoxic appearing. No voice changes.  He has some known poor dentition.  He denies fever, chills, malaise. On ASA and plavix. H/o CKD, CHF, COPD, OSA on CPAP, HTN, colon cancer, atrial tachycardia.   Past Medical History:  Diagnosis Date  . Arthritis   . Atrial tachycardia ([place])    managed on beta blocker therapy  . Childhood asthma    "went away after I was 16"  . Chronic combined systolic and diastolic CHF, NYHA class 3 (HCC)    has diastolic heart failure grade 1; EF is 45 to 50% per echo 05/2011; EF 41% by Myoview 2016  . CKD (chronic kidney disease) stage 3, GFR 30-59 ml/min (HCC)   . Colon cancer ([place]) 2015   MSI high; IHC loss of MLH1 and PMS2; BRAF negative; Negative methylation  . COPD (chronic obstructive pulmonary disease) ([place])   . Coronary artery disease   . Family history of ovarian cancer   . Hypercholesteremia   . Hypertension   . Noncompliance   . NSVT (nonsustained ventricular tachycardia) (HCC)    beta blocker restarted  . Obesity   . OSA on CPAP    used nightly, pt does not know settings  . Peripheral arterial disease (HCC)    4.2 cm thoracic aortic aneurysm per chest ct 11-14-15 epic  . S/P colostomy ([place])    2014  . Thrombocytopenia ([place])   . Torsades de pointes ([place])    X 2 episodes during hospital visit 12'14"electrolyte imbalance"- "Shocked"    Past Surgical History:  Procedure Laterality Date  . COLON SURGERY    . COLONOSCOPY N/A 02/08/2013   Procedure: COLONOSCOPY;  Surgeon: [name], MD;  Location: [facility];  Service: Endoscopy;  Laterality: N/A;  . COLONOSCOPY WITH  PROPOFOL N/A 05/09/2014   Procedure: COLONOSCOPY WITH PROPOFOL;  Surgeon: [name], MD;  [facility];  Service: Endoscopy;  Laterality: N/A;  . COLONOSCOPY WITH PROPOFOL N/A 01/08/2016   Procedure: COLONOSCOPY WITH PROPOFOL;  Surgeon: [name], MD;  [facility];  Service: Endoscopy;  Laterality: N/A;  . COLOSTOMY N/A 04/19/2016   Procedure: COLOSTOMY;  Surgeon: Judeth Horn, MD;  Location: [facility];  Service: General;  Laterality: N/A;  . COLOSTOMY REVERSAL  04/12/2016  . COLOSTOMY REVERSAL N/A 04/12/2016   Procedure: COLOSTOMY REVERSAL;  Surgeon: Judeth Horn, MD;  Location: [facility];  Service: General;  Laterality: N/A;  . CORONARY ANGIOPLASTY WITH STENT PLACEMENT  11/12/2008; 06/11/2014   stent x 2 to RCA; stent x 2 to LAD  . CORONARY ANGIOPLASTY WITH STENT PLACEMENT  06/11/2014   m-LAD 3.5 x 16 mm Synergy DES, d-LAD  2.25 x 16 mm Synergy DES  . CYSTOSCOPY W/ URETERAL STENT PLACEMENT Left 06/30/2017   Procedure: CYSTOSCOPY WITH RETROGRADE PYELOGRAM/URETERAL STENT PLACEMENT;  Surgeon: Lucas Mallow, MD;  Location: [facility];  Service: Urology;  Laterality: Left;  . CYSTOSCOPY WITH LITHOLAPAXY N/A 08/02/2017   Procedure: CYSTOSCOPY WITH BLADDER STONE EXTRACTION;  Surgeon: Lucas Mallow, MD;  [facility];  Service: Urology;  Laterality: N/A;  . CYSTOSCOPY WITH RETROGRADE PYELOGRAM, URETEROSCOPY AND STENT PLACEMENT Left 08/02/2017   Procedure:  CYSTOSCOPY WITH LEFT RETROGRADE PYELOGRAM, URETEROSCOPY AND STENT EXCHANGE;  Surgeon: Lucas Mallow, MD;  [facility];  Service: Urology;  Laterality: Left;  . ESOPHAGOGASTRODUODENOSCOPY N/A 02/08/2013   Procedure: ESOPHAGOGASTRODUODENOSCOPY (EGD);  Surgeon: [name], MD;  Location: [facility];  Service: Endoscopy;  Laterality: N/A;  . FLEXIBLE SIGMOIDOSCOPY N/A 02/19/2016   Procedure: FLEXIBLE SIGMOIDOSCOPY;  Surgeon: [name], MD;  [facility];  Service: Endoscopy;  Laterality: N/A;  . HOLMIUM  LASER APPLICATION Left 1/60/7371   Procedure: HOLMIUM LASER APPLICATION;  Surgeon: Lucas Mallow, MD;  [facility];  Service: Urology;  Laterality: Left;  . LAPAROTOMY N/A 02/12/2013   Procedure: EXPLORATORY LAPAROTOMY PARTIAL COLECTOMY WITH COLOSTOMY;  Surgeon: [name], MD;  Location: [place];  Service: General;  Laterality: N/A;  . LAPAROTOMY N/A 02/18/2013   Procedure: EXPLORATORY LAPAROTOMY/Closure of Wound;  Surgeon: [name], MD;  Location: [facility];  Service: General;  Laterality: N/A;  . LAPAROTOMY N/A 04/19/2016   Procedure: EXPLORATORY LAPAROTOMY, REPAIR OF ANASTAMOTIC LEAK;  Surgeon: Judeth Horn, MD;  Location: [facility];  Service: General;  Laterality: N/A;  . LEFT HEART CATHETERIZATION WITH CORONARY ANGIOGRAM N/A 06/11/2014   Procedure: LEFT HEART CATHETERIZATION WITH CORONARY ANGIOGRAM;  Surgeon: [name], MD; CFX calcified, 30-40 percent, RCA calcified, 40/50/40%, PDA diffuse disease, LAD 40/75/90% s/p DES 2     Family History  Problem Relation Age of Onset  . Hypertension Father   . Heart disease Father        before age 49  . Diabetes Father   . Hypertension Mother   . Dementia Mother   . Parkinsonism Mother   . Liver cancer Maternal Grandmother        dx in her 70s  . Prostate cancer Maternal Uncle   . Leukemia Maternal Uncle     Social History   Socioeconomic History  . Marital status: Married    Spouse name: [name]  . Number of children: 1  . Years of education: Not on file  . Highest education level: Not on file  Occupational History  . Not on file  Social Needs  . Financial resource strain: Not on file  . Food insecurity    Worry: Not on file    Inability: Not on file  . Transportation needs    Medical: Not on file    Non-medical: Not on file  Tobacco Use  . Smoking status: Former Smoker    Packs/day: 1.00    Years: 38.00    Pack years: 38.00    Types: Cigarettes    Quit date: 07/15/2008    Years since quitting: 10.2  .  Smokeless tobacco: Never Used  Substance and Sexual Activity  . Alcohol use: Yes    Alcohol/week: 0.0 standard drinks    Comment: none snice colostomy placed  . Drug use: No  . Sexual activity: Not Currently  Lifestyle  . Physical activity    Days per week: Not on file    Minutes per session: Not on file  . Stress: Not on file  Relationships  . Social Herbalist on phone: Not on file    Gets together: Not on file    Attends religious service: Not on file    Active member of club or organization: Not on file    Attends meetings of clubs or organizations: Not on file    Relationship status: Not on file  Other Topics Concern  . Not  on file  Social History Narrative  . Not on file    No current facility-administered medications on file prior to encounter.    Current Outpatient Medications on File Prior to Encounter  Medication Sig Dispense Refill  . aspirin EC 81 MG tablet Take 1 tablet (81 mg total) by mouth daily. 30 tablet 0  . atorvastatin (LIPITOR) 40 MG tablet TAKE 1 TABLET BY MOUTH EVERY DAY (Patient taking differently: Take 40 mg by mouth daily at 6 PM. ) 90 tablet 3  . clopidogrel (PLAVIX) 75 MG tablet Take 75 mg by mouth daily.    [name] Kitchen diltiazem (CARDIZEM) 90 MG tablet Take 1 tablet (90 mg total) by mouth 3 (three) times daily. 270 tablet 2  . ferrous sulfate 325 (65 FE) MG tablet Take 1 tablet (325 mg total) by mouth daily with breakfast. 30 tablet 1  . furosemide (LASIX) 40 MG tablet TAKE 1.5 TABLETS (60 MG TOTAL) BY MOUTH DAILY. 135 tablet 1  . metoprolol tartrate (LOPRESSOR) 25 MG tablet TAKE 1.5 TABLETS (37.5 MG TOTAL) BY MOUTH 2 (TWO) TIMES DAILY. 270 tablet 0  . Multiple Vitamins-Minerals (CENTRUM SILVER 50+MEN PO) Take 1 tablet by mouth daily.    . nitroGLYCERIN (NITROSTAT) 0.4 MG SL tablet Place 0.4 mg under the tongue every 5 (five) minutes as needed for chest pain.    . simethicone (GAS-X EXTRA STRENGTH) 125 MG chewable tablet Chew 125 mg by mouth 3  (three) times daily.    [name] Kitchen spironolactone (ALDACTONE) 25 MG tablet TAKE 0.5 TABLETS (12.5 MG TOTAL) BY MOUTH DAILY. 30 tablet 6  . tamsulosin (FLOMAX) 0.4 MG CAPS capsule Take 1 capsule (0.4 mg total) by mouth daily. 90 capsule 3  . diltiazem (CARDIZEM) 60 MG tablet TAKE 1.5 TABLETS (90 MG TOTAL) BY MOUTH 3 (THREE) TIMES DAILY. (Patient not taking: Reported on 10/27/2018) 150 tablet 2  . levalbuterol (XOPENEX HFA) 45 MCG/ACT inhaler INHALE 2 PUFFS BY MOUTH EVERY 4 HOURS AS NEEDED FOR WHEEZE (Patient not taking: Reported on 10/27/2018) 15 Inhaler 1    No Known Allergies   Review of Systems: Review of systems complete and negative except for the above.   OBJECTIVE: Vital Signs: Vitals:   10/29/18 2209 10/30/18 0709  BP: 128/79 118/76  Pulse: 85 85  Resp: 20   Temp: 98.6 F (37 C) 98.2 F (36.8 C)  SpO2: 97% 95%    I&O  Intake/Output Summary (Last 24 hours) at 10/30/2018 1227 Last data filed at 10/29/2018 2242 Gross per 24 hour  Intake 50 ml  Output 1200 ml  Net -1150 ml    Physical Exam General: Well developed, well nourished. No acute distress. Voice without dysphonia.   Head/Face: Normocephalic, atraumatic. No scars or lesions. No sinus tenderness. Facial nerve intact and equal bilaterally.  No facial lacerations. Salivary glands non tender and without palpable masses, with the exception of some tenderness in the region of the left submandibular gland.  Eyes: Globes well positioned, no proptosis Lids: No periorbital edema/ecchymosis. No lid laceration Conjunctiva: No chemosis, hemorrhage PERRL Extra occular movement: Full ROM bilaterally. No gaze restriction    Ears: No gross deformity. Normal external canal. Tympanic membrane intact bilaterally and without effusion  Hearing:  Normal speech reception.  Nose: No gross deformity or lesions. No purulent discharge. Septum midline. No turbinate hypertrophy.  Mouth/Oropharynx: Lips without any lesions. Dentition is very poor.   Multiple carious teeth especially in the left upper maxilla and left mandible.  Missing multiple teeth.  He does  have moderate trismus.  Uvula is midline and the tonsils themselves appear to be symmetric.  There is swelling in the left soft palate adjacent to the left upper molar in question.  Neck: Trachea midline. No masses. No thyromegaly or nodules palpated. No crepitus.  Lymphatic:  Some swelling on the left side but no palpable mass or fluctuance.  Respiratory: No stridor or distress.  Voice is of normal quality.  Nonlabored breathing.  Cardiovascular: Regular rate and rhythm.  Extremities: No edema or cyanosis. Warm and well-perfused.  Skin: No scars or lesions on face or neck.  Neurologic: CN II-XII intact. Moving all extremities without gross abnormality.  Other:      Labs: Lab Results  Component Value Date   WBC 9.4 10/27/2018   HGB 13.7 10/27/2018   HCT 43.4 10/27/2018   PLT 240 10/27/2018   CHOL 164 09/11/2018   TRIG 147 09/11/2018   HDL 53 09/11/2018   ALT 27 09/11/2018   AST 23 09/11/2018   NA 138 10/29/2018   K 4.2 10/29/2018   CL 105 10/29/2018   CREATININE 2.02 (H) 10/29/2018   BUN 26 (H) 10/29/2018   CO2 21 (L) 10/29/2018   TSH 1.630 09/11/2018   INR 1.31 06/30/2017   HGBA1C 5.9 (H) 05/14/2016     9/5 SARS-CoV-2 negative  9/5 group A strep not detected  9/6 respiratory viral panel negative   Review of Ancillary Data / Diagnostic Tests:  9/5 CT neck soft tissue: Enlargement left palatine tonsil may be related to acute pharyngitis or tonsillitis.  No abscess present.  Inflammatory changes left submandibular gland extending to left platysma.  Lucency about tooth roots residual left maxillary molar.  ASSESSMENT:  68 y.o. male with left-sided soft palate swelling along with trismus and left neck swelling.  The tonsils themselves appear to be normal.  There is no uvular deviation.  There is some trismus.  I suspect this to likely be an odontogenic source of  infection.  We will see what the repeat CT scan looks like.  RECOMMENDATIONS: We will follow Agree with escalation of antibiotics.  Would ensure very adequate staph and strep coverage as well as gram-negative coverage given oral bacterial involvement likely. Consider IV Decadron, 10 mg, every 8 hours for up to 3 doses.  This may help with his trismus. Consider holding anticoagulation in case he needs surgery, if that is otherwise safe from an medical standpoint. N.p.o. at midnight in case of the need for surgery tomorrow.     Gavin Pound, MD  [facility], [place] Office phone [phone]

## 2018-10-31 ENCOUNTER — Other Ambulatory Visit: Payer: Self-pay | Admitting: Nurse Practitioner

## 2018-10-31 DIAGNOSIS — I10 Essential (primary) hypertension: Secondary | ICD-10-CM

## 2018-10-31 LAB — BASIC METABOLIC PANEL
Anion gap: 10 (ref 5–15)
BUN: 24 mg/dL — ABNORMAL HIGH (ref 8–23)
CO2: 20 mmol/L — ABNORMAL LOW (ref 22–32)
Calcium: 9.5 mg/dL (ref 8.9–10.3)
Chloride: 107 mmol/L (ref 98–111)
Creatinine, Ser: 1.97 mg/dL — ABNORMAL HIGH (ref 0.61–1.24)
GFR calc Af Amer: 40 mL/min — ABNORMAL LOW (ref >60)
GFR calc non Af Amer: 35 mL/min — ABNORMAL LOW (ref >60)
Glucose, Bld: 129 mg/dL — ABNORMAL HIGH (ref 70–99)
Potassium: 4.8 mmol/L (ref 3.5–5.1)
Sodium: 137 mmol/L (ref 135–145)

## 2018-10-31 LAB — CBC
HCT: 38.4 % — ABNORMAL LOW (ref 39.0–52.0)
Hemoglobin: 12.3 g/dL — ABNORMAL LOW (ref 13.0–17.0)
MCH: 29.7 pg (ref 26.0–34.0)
MCHC: 32 g/dL (ref 30.0–36.0)
MCV: 92.8 fL (ref 80.0–100.0)
Platelets: 257 10*3/uL (ref 150–400)
RBC: 4.14 MIL/uL — ABNORMAL LOW (ref 4.22–5.81)
RDW: 13.6 % (ref 11.5–15.5)
WBC: 7.9 10*3/uL (ref 4.0–10.5)
nRBC: 0 % (ref 0.0–0.2)

## 2018-10-31 MED ORDER — INFLUENZA VAC A&B SA ADJ QUAD 0.5 ML IM PRSY
0.5000 mL | PREFILLED_SYRINGE | INTRAMUSCULAR | Status: AC
  Administered 2018-11-01: 0.5 mL via INTRAMUSCULAR
  Filled 2018-10-31: qty 0.5

## 2018-10-31 MED ORDER — CLINDAMYCIN PHOSPHATE 600 MG/50ML IV SOLN
600.0000 mg | Freq: Three times a day (TID) | INTRAVENOUS | Status: DC
  Administered 2018-10-31 – 2018-11-02 (×5): 600 mg via INTRAVENOUS
  Filled 2018-10-31 (×5): qty 50

## 2018-10-31 NOTE — Care Management Important Message (Signed)
Important Message  Patient Details  Name: Larry [name] MRN: [id number] Date of Birth: 11-25-1950   Medicare Important Message Given:  Yes     Antanasia Kaczynski 10/31/2018, 1:27 PM

## 2018-10-31 NOTE — Progress Notes (Signed)
ENT Consult Progress Note  Subjective:  Patient did well overnight.  He continues to have no difficulty with swallowing saliva or breathing.  He is tolerating lying flat quite well.  Appears comfortable and pain is well controlled.  Afebrile.  He underwent CT neck last night.  The results of this are below.  Objective: Vitals:   10/30/18 2314 10/31/18 0724  BP: 122/79 (!) 131/93  Pulse: 84 80  Resp: 16 16  Temp: 98.4 F (36.9 C) 98.4 F (36.9 C)  SpO2: 97% 95%    Physical Exam: CONSTITUTIONAL: well developed, nourished, no distress and alert and oriented x 3 CARDIOVASCULAR: normal rate and regular rhythm  HENT: Head : normocephalic and atraumatic   Nose: nose normal and no purulence Mouth/Throat: Very difficult oral examination due to patient cooperativity.  Mouth: uvula midline and no oral lesions.  The left soft palate is a little edematous.  The tonsils themselves appear to be normal.  Very poor dentition especially in the molars on the left side.  Moderate trismus is stable. EYES: conjunctiva normal, EOM normal and PERRL NECK: supple, trachea normal, moderate left neck edema, moderate submandibular gland edema.  I believe this is likely reactive.    Data Review/Consults/Discussions: CT neck reviewed.  Periapical lucencies in the left maxillary molar is the start of swelling in the parapharyngeal region.  The tonsils appear normal.  There is no peritonsillar abscess.  Some swelling extends down through the platysma and into the submandibular gland.  These types of infections typically start in the teeth.  This is not a typical pattern of edema for tonsillitis or peritonsillar phlegmon or submandibular sialadenitis.  I think any glandular swelling would be reactive in nature.   Assessment: Larry [name] 68 y.o. male who presents with left soft palate, pharyngeal and submandibular swelling.  There is no evidence of any abscess formation.  I think this is likely odontogenic in  origin.  No surgeries are planned at this time.   Plan: I would recommend obtaining a Panorex Continue antibiotics.  CT demonstrates that he may be starting to improve somewhat, though with his pattern of edema, I am suspicious that his poor dentition is the root cause of this infection and process. Encourage warm compress, massage and hydration.  IV Decadron as discussed yesterday. Recommend dentistry consult.   Thank you for involving [facility] in the care of this patient. Should you need further assistance, please call our office at [phone].    Gavin Pound, MD

## 2018-10-31 NOTE — Progress Notes (Addendum)
PROGRESS NOTE    Larry [name]  [id number] DOB: 1950/08/01 DOA: 10/27/2018 PCP: Lin Landsman, MD    Brief Narrative:  68 year old male who presented to sore throat.  He does have significant past medical history for atrial tachycardia, torsades, systolic heart failure, clinically stage III, COPD, coronary artery disease, hypertension, dyslipidemia obstructive sleep apnea, history of colon cancer status post colostomy. He reported 1 week of persistent sore throat to the point where he had difficulty opening his mouth, and not able to eat for the last 3 days prior to hospitalization.  On his initial physical examination he was afebrile.  Blood pressure 117/79, heart rate 92, respiratory 13, oxygen saturation 100%.  He had difficulty opening his mouth, his left lower face was edematous, tender left-sided anterior cervical lymphadenopathy, his lungs are clear to auscultation bilaterally, heart S1-S2 present with me, abdomen soft, lower extremities with trace edema.  Chronic venous stasis dermatitis of the lower extremities. Sodium 136, potassium 3.9, chloride 99, bicarb 22, glucose 104, BUN 27, creatinine 2.77, white count 9.4, hemoglobin 13.7, hematocrit 43.4, platelets 240.  SARS COVID-19 was negative.  Group A strep PCR negative.  Soft tissue CT scan with acetaminophen enlargement of her left palatine tonsil related to acute pharyngitis or tonsillitis.  Inflammatory changes about the left submandibular gland extending into the left platysma likely extends from tonsils.  Lucency about the tooth roots of the residual left maxillary molar.  Patient was admitted to the hospital working diagnosis acute pharyngitis/tonsillitis.  Assessment & Plan:   Principal Problem:   Pharyngitis Active Problems:   Essential hypertension   CKD (chronic kidney disease), stage III (HCC)   Chronic diastolic CHF (congestive heart failure) (HCC)   Acute kidney injury superimposed on chronic kidney disease III  (HCC)   Poor dentition   Tonsillitis   1. Acute pharyngitis/ tonsillitis. Patient continue to have pain and difficulty swallowing. Follow CT with no abscess formation. Seems that left maxillary dental disease originated infection. I was not able to consult dental, none available for inpatient consultation. Will try to arrange outpatient follow up. Continue as needed analgesics, steroids and antibiotics. Will change back to clindamycin for antibiotic therapy.   2. AKI on CKD stage 3. Improving renal function with serum cr down to 1.97 with K at 4,8 and serum bicarbonate at 24. Follow on BMP in am.   3. Diastolic heart failure/ ischemic heart disease. Clinically euvolemic.   4. Hx of Non sustained VT. Continue av blockade with metoprolol and diltiazem.   5. Obesity. Calculated BMI is 47,2. Will need outpatient follow up.   DVT prophylaxis: heparin   Code Status: full Family Communication: no family at the bedside  Disposition Plan/ discharge barriers: pending clinical improvement.  Body mass index is 47.26 kg/m. Malnutrition Type:      Malnutrition Characteristics:      Nutrition Interventions:     RN Pressure Injury Documentation:     Consultants:   ENT   Procedures:     Antimicrobials:   Clindamycin.     Subjective: Patient continue to have mouth pain, difficulty eating, no nausea or vomiting, no dyspnea, but continue neck pain. No chest pain.   Objective: Vitals:   10/30/18 1640 10/30/18 2052 10/30/18 2314 10/31/18 0724  BP: 118/74 (!) 142/94 122/79 (!) 131/93  Pulse: (!) 108 (!) 106 84 80  Resp:  20 16 16   Temp: 98.5 F (36.9 C) 99.1 F (37.3 C) 98.4 F (36.9 C) 98.4 F (36.9 C)  TempSrc:  Oral Oral Oral Oral  SpO2: 96% 99% 97% 95%  Weight:      Height:        Intake/Output Summary (Last 24 hours) at 10/31/2018 0855 Last data filed at 10/30/2018 2350 Gross per 24 hour  Intake 100 ml  Output 825 ml  Net -725 ml   Filed Weights   10/27/18  1801 10/28/18 0350  Weight: (!) 158.8 kg (!) 153.7 kg    Examination:   General: deconditioned  Neurology: Awake and alert, non focal  E ENT: no pallor, no icterus, positive tender edema at the left neck, limited mouth opening due to pain.  Cardiovascular: No JVD. S1-S2 present, rhythmic, no gallops, rubs, or murmurs. No lower extremity edema. Pulmonary: positive breath sounds bilaterally, adequate air movement, no wheezing, rhonchi or rales. Gastrointestinal. Abdomen with no organomegaly, non tender, no rebound or guarding Skin. No rashes Musculoskeletal: no joint deformities     Data Reviewed: I have personally reviewed following labs and imaging studies  CBC: Recent Labs  Lab 10/27/18 1847 10/31/18 0412  WBC 9.4 7.9  NEUTROABS 7.4  --   HGB 13.7 12.3*  HCT 43.4 38.4*  MCV 92.9 92.8  PLT 240 [id number]   Basic Metabolic Panel: Recent Labs  Lab 10/27/18 1847 10/28/18 0529 10/29/18 0502 10/31/18 0412  NA 136 137 138 137  K 3.9 4.1 4.2 4.8  CL 99 104 105 107  CO2 22 20* 21* 20*  GLUCOSE 104* 157* 116* 129*  BUN 27* 27* 26* 24*  CREATININE 2.77* 2.37* 2.02* 1.97*  CALCIUM 10.0 9.6 9.2 9.5   GFR: Estimated Creatinine Clearance: 56.4 mL/min (A) (by C-G formula based on SCr of 1.97 mg/dL (H)). Liver Function Tests: No results for input(s): AST, ALT, ALKPHOS, BILITOT, PROT, ALBUMIN in the last 168 hours. No results for input(s): LIPASE, AMYLASE in the last 168 hours. No results for input(s): AMMONIA in the last 168 hours. Coagulation Profile: No results for input(s): INR, PROTIME in the last 168 hours. Cardiac Enzymes: No results for input(s): CKTOTAL, CKMB, CKMBINDEX, TROPONINI in the last 168 hours. BNP (last 3 results) No results for input(s): PROBNP in the last 8760 hours. HbA1C: No results for input(s): HGBA1C in the last 72 hours. CBG: No results for input(s): GLUCAP in the last 168 hours. Lipid Profile: No results for input(s): CHOL, HDL, LDLCALC, TRIG,  CHOLHDL, LDLDIRECT in the last 72 hours. Thyroid Function Tests: No results for input(s): TSH, T4TOTAL, FREET4, T3FREE, THYROIDAB in the last 72 hours. Anemia Panel: No results for input(s): VITAMINB12, FOLATE, FERRITIN, TIBC, IRON, RETICCTPCT in the last 72 hours.    Radiology Studies: I have reviewed all of the imaging during this hospital visit personally     Scheduled Meds: . aspirin EC  81 mg Oral Daily  . clopidogrel  75 mg Oral Daily  . dexamethasone (DECADRON) injection  10 mg Intravenous Q8H  . diltiazem  90 mg Oral TID  . hydrocerin   Topical BID  . metoprolol tartrate  37.5 mg Oral BID  . tamsulosin  0.4 mg Oral Daily   Continuous Infusions: . cefTRIAXone (ROCEPHIN)  IV 2 g (10/30/18 1710)  . metronidazole 500 mg (10/30/18 2250)     LOS: 3 days        Larry Wojdyla Gerome Apley, MD

## 2018-10-31 NOTE — Progress Notes (Signed)
Physical Therapy Treatment/Discharge Patient Details Name: ALWIN LANIGAN MRN: [id number] DOB: [birth year] Today's Date: 10/31/2018    History of Present Illness Pt adm with Acute pharyngitis/tonsillitis and trismus. PMH - cad, ckd, htn, colon CA with colostomy, sleep apnea, obesity    PT Comments    Pt doing well with mobility. Will dc from PT. Encouraged pt to amb in hall daily wearing mask to maintain strength and mobility.    Follow Up Recommendations  No PT follow up     Equipment Recommendations  None recommended by PT    Recommendations for Other Services       Precautions / Restrictions Precautions Precautions: None    Mobility  Bed Mobility Overal bed mobility: Modified Independent                Transfers Overall transfer level: Modified independent Equipment used: None Transfers: Sit to/from Stand Sit to Stand: Modified independent (Device/Increase time)            Ambulation/Gait Ambulation/Gait assistance: Modified independent (Device/Increase time) Gait Distance (Feet): 225 Feet Assistive device: None Gait Pattern/deviations: Step-through pattern;Decreased stride length;Wide base of support Gait velocity: decr Gait velocity interpretation: 1.31 - 2.62 ft/sec, indicative of limited community ambulator General Gait Details: Steady gait   Stairs             Wheelchair Mobility    Modified Rankin (Stroke Patients Only)       Balance Overall balance assessment: Mild deficits observed, not formally tested                                          Cognition Arousal/Alertness: Awake/alert Behavior During Therapy: WFL for tasks assessed/performed Overall Cognitive Status: Within Functional Limits for tasks assessed                                        Exercises      General Comments        Pertinent Vitals/Pain Pain Assessment: Faces Faces Pain Scale: Hurts little more Pain  Location: jaw/throat Pain Intervention(s): Limited activity within patient's tolerance    Home Living                      Prior Function            PT Goals (current goals can now be found in the care plan section) Acute Rehab PT Goals Patient Stated Goal: return home Progress towards PT goals: Goals met/education completed, patient discharged from PT    Frequency    Min 3X/week      PT Plan Current plan remains appropriate    Co-evaluation              AM-PAC PT "6 Clicks" Mobility   Outcome Measure  Help needed turning from your back to your side while in a flat bed without using bedrails?: None Help needed moving from lying on your back to sitting on the side of a flat bed without using bedrails?: None Help needed moving to and from a bed to a chair (including a wheelchair)?: None Help needed standing up from a chair using your arms (e.g., wheelchair or bedside chair)?: None Help needed to walk in hospital room?: None Help needed climbing 3-5 steps with a railing? : A  Little 6 Click Score: 23    End of Session   Activity Tolerance: Patient tolerated treatment well Patient left: with call bell/phone within reach;in bed(sitting EOB)   PT Visit Diagnosis: Other abnormalities of gait and mobility (R26.89)     Time: 1053-1101 PT Time Calculation (min) (ACUTE ONLY): 8 min  Charges:  $Gait Training: 8-22 mins                     [place] Pager [phone] Office [place] 10/31/2018, 11:40 AM

## 2018-11-01 LAB — CBC WITH DIFFERENTIAL/PLATELET
Abs Immature Granulocytes: 0.09 10*3/uL — ABNORMAL HIGH (ref 0.00–0.07)
Basophils Absolute: 0 10*3/uL (ref 0.0–0.1)
Basophils Relative: 0 %
Eosinophils Absolute: 0 10*3/uL (ref 0.0–0.5)
Eosinophils Relative: 0 %
HCT: 39.2 % (ref 39.0–52.0)
Hemoglobin: 12.4 g/dL — ABNORMAL LOW (ref 13.0–17.0)
Immature Granulocytes: 1 %
Lymphocytes Relative: 5 %
Lymphs Abs: 0.5 10*3/uL — ABNORMAL LOW (ref 0.7–4.0)
MCH: 29 pg (ref 26.0–34.0)
MCHC: 31.6 g/dL (ref 30.0–36.0)
MCV: 91.8 fL (ref 80.0–100.0)
Monocytes Absolute: 0.5 10*3/uL (ref 0.1–1.0)
Monocytes Relative: 5 %
Neutro Abs: 10.2 10*3/uL — ABNORMAL HIGH (ref 1.7–7.7)
Neutrophils Relative %: 89 %
Platelets: 290 10*3/uL (ref 150–400)
RBC: 4.27 MIL/uL (ref 4.22–5.81)
RDW: 13.6 % (ref 11.5–15.5)
WBC: 11.3 10*3/uL — ABNORMAL HIGH (ref 4.0–10.5)
nRBC: 0 % (ref 0.0–0.2)

## 2018-11-01 LAB — BASIC METABOLIC PANEL
Anion gap: 10 (ref 5–15)
BUN: 28 mg/dL — ABNORMAL HIGH (ref 8–23)
CO2: 18 mmol/L — ABNORMAL LOW (ref 22–32)
Calcium: 9.4 mg/dL (ref 8.9–10.3)
Chloride: 108 mmol/L (ref 98–111)
Creatinine, Ser: 1.89 mg/dL — ABNORMAL HIGH (ref 0.61–1.24)
GFR calc Af Amer: 42 mL/min — ABNORMAL LOW (ref >60)
GFR calc non Af Amer: 36 mL/min — ABNORMAL LOW (ref >60)
Glucose, Bld: 133 mg/dL — ABNORMAL HIGH (ref 70–99)
Potassium: 4.5 mmol/L (ref 3.5–5.1)
Sodium: 136 mmol/L (ref 135–145)

## 2018-11-01 NOTE — Progress Notes (Signed)
PROGRESS NOTE    Larry [name]  [id number] DOB: 1951/01/24 DOA: 10/27/2018 PCP: Lin Landsman, MD    Brief Narrative:  68 year old male who presented to sore throat.  He does have significant past medical history for atrial tachycardia, torsades, systolic heart failure, clinically stage III, COPD, coronary artery disease, hypertension, dyslipidemia obstructive sleep apnea, history of colon cancer status post colostomy. He reported 1 week of persistent sore throat to the point where he had difficulty opening his mouth, and not able to eat for the last 3 days prior to hospitalization.  On his initial physical examination he was afebrile.  Blood pressure 117/79, heart rate 92, respiratory 13, oxygen saturation 100%.  He had difficulty opening his mouth, his left lower face was edematous, tender left-sided anterior cervical lymphadenopathy, his lungs are clear to auscultation bilaterally, heart S1-S2 present with me, abdomen soft, lower extremities with trace edema.  Chronic venous stasis dermatitis of the lower extremities. Sodium 136, potassium 3.9, chloride 99, bicarb 22, glucose 104, BUN 27, creatinine 2.77, white count 9.4, hemoglobin 13.7, hematocrit 43.4, platelets 240.  SARS COVID-19 was negative.  Group A strep PCR negative.  Soft tissue CT scan with acetaminophen enlargement of her left palatine tonsil related to acute pharyngitis or tonsillitis.  Inflammatory changes about the left submandibular gland extending into the left platysma likely extends from tonsils.  Lucency about the tooth roots of the residual left maxillary molar.  Patient was admitted to the hospital working diagnosis acute pharyngitis/tonsillitis.   Assessment & Plan:   Principal Problem:   Pharyngitis Active Problems:   Essential hypertension   CKD (chronic kidney disease), stage III (HCC)   Chronic diastolic CHF (congestive heart failure) (HCC)   Acute kidney injury superimposed on chronic kidney disease  III (HCC)   Poor dentition   Tonsillitis     1. Acute pharyngitis/ tonsillitis. Patient slowly improving, improve po intake and voice, not yet back to baseline, will need outpatient follow up with dental surgery. Follow up neck CT with no worsening infection or abscess formation. Continue analgesics and antibiotic therapy with IV clindamycin. Patient received one dose of systemic steroids. Continue with as needed analgesics.   2. AKI on CKD stage 3. Serum cr continue to trend down, now at 1,89 with K at 4,5 and serum bicarbonate at 18. Will follow on renal panel in am.  3. Diastolic heart failure/ ischemic heart disease. No signs of acute decompensation. Continue with aspirin and clopidogrel.   4. Hx of Non sustained VT. On metoprolol and diltiazem.   5. Obesity. Calculated BMI is 47,2..   DVT prophylaxis: heparin   Code Status: full Family Communication: no family at the bedside  Disposition Plan/ discharge barriers: possible dc in am.     Body mass index is 47.26 kg/m. Malnutrition Type:      Malnutrition Characteristics:      Nutrition Interventions:     RN Pressure Injury Documentation:     Consultants:   ENT   Procedures:     Antimicrobials:   IV clindamycin.     Subjective: Patient continue to improve but not yet back to baseline, continue to have pain with swallowing, his voice has improved, no nausea or vomiting.    Objective: Vitals:   10/31/18 0724 10/31/18 1559 10/31/18 2125 11/01/18 0910  BP: (!) 131/93 122/83 107/66 126/73  Pulse: 80 97 93 82  Resp: 16 16 18 17   Temp: 98.4 F (36.9 C) 98.7 F (37.1 C) 98.2 F (36.8 C)  97.6 F (36.4 C)  TempSrc: Oral Oral Oral Oral  SpO2: 95% 99% 99% 97%  Weight:      Height:        Intake/Output Summary (Last 24 hours) at 11/01/2018 1048 Last data filed at 11/01/2018 [id number] Gross per 24 hour  Intake 300.83 ml  Output 1450 ml  Net -1149.17 ml   Filed Weights   10/27/18 1801 10/28/18  0350  Weight: (!) 158.8 kg (!) 153.7 kg    Examination:   General: Not in pain or dyspnea, deconditioned  Neurology: Awake and alert, non focal  E ENT: no pallor, no icterus, oral mucosa moist/ neck is tender and edematous at the left side, improved in intensity but not back to baseline. Improved mouth opening.  Cardiovascular: No JVD. S1-S2 present, rhythmic, no gallops, rubs, or murmurs. No lower extremity edema. Pulmonary: positive breath sounds bilaterally, adequate air movement, no wheezing, rhonchi or rales. Gastrointestinal. Abdomen with no organomegaly, non tender, no rebound or guarding Skin. No rashes Musculoskeletal: no joint deformities     Data Reviewed: I have personally reviewed following labs and imaging studies  CBC: Recent Labs  Lab 10/27/18 1847 10/31/18 0412 11/01/18 0357  WBC 9.4 7.9 11.3*  NEUTROABS 7.4  --  10.2*  HGB 13.7 12.3* 12.4*  HCT 43.4 38.4* 39.2  MCV 92.9 92.8 91.8  PLT 240 257 [id number]   Basic Metabolic Panel: Recent Labs  Lab 10/27/18 1847 10/28/18 0529 10/29/18 0502 10/31/18 0412 11/01/18 0357  NA 136 137 138 137 136  K 3.9 4.1 4.2 4.8 4.5  CL 99 104 105 107 108  CO2 22 20* 21* 20* 18*  GLUCOSE 104* 157* 116* 129* 133*  BUN 27* 27* 26* 24* 28*  CREATININE 2.77* 2.37* 2.02* 1.97* 1.89*  CALCIUM 10.0 9.6 9.2 9.5 9.4   GFR: Estimated Creatinine Clearance: 58.8 mL/min (A) (by C-G formula based on SCr of 1.89 mg/dL (H)). Liver Function Tests: No results for input(s): AST, ALT, ALKPHOS, BILITOT, PROT, ALBUMIN in the last 168 hours. No results for input(s): LIPASE, AMYLASE in the last 168 hours. No results for input(s): AMMONIA in the last 168 hours. Coagulation Profile: No results for input(s): INR, PROTIME in the last 168 hours. Cardiac Enzymes: No results for input(s): CKTOTAL, CKMB, CKMBINDEX, TROPONINI in the last 168 hours. BNP (last 3 results) No results for input(s): PROBNP in the last 8760 hours. HbA1C: No results for  input(s): HGBA1C in the last 72 hours. CBG: No results for input(s): GLUCAP in the last 168 hours. Lipid Profile: No results for input(s): CHOL, HDL, LDLCALC, TRIG, CHOLHDL, LDLDIRECT in the last 72 hours. Thyroid Function Tests: No results for input(s): TSH, T4TOTAL, FREET4, T3FREE, THYROIDAB in the last 72 hours. Anemia Panel: No results for input(s): VITAMINB12, FOLATE, FERRITIN, TIBC, IRON, RETICCTPCT in the last 72 hours.    Radiology Studies: I have reviewed all of the imaging during this hospital visit personally     Scheduled Meds: . aspirin EC  81 mg Oral Daily  . clopidogrel  75 mg Oral Daily  . diltiazem  90 mg Oral TID  . hydrocerin   Topical BID  . influenza vaccine adjuvanted  0.5 mL Intramuscular Tomorrow-1000  . metoprolol tartrate  37.5 mg Oral BID  . tamsulosin  0.4 mg Oral Daily   Continuous Infusions: . clindamycin (CLEOCIN) IV 600 mg (11/01/18 0627)     LOS: 4 days         Gerome Apley, MD

## 2018-11-02 DIAGNOSIS — K089 Disorder of teeth and supporting structures, unspecified: Secondary | ICD-10-CM

## 2018-11-02 LAB — BASIC METABOLIC PANEL
Anion gap: 9 (ref 5–15)
BUN: 35 mg/dL — ABNORMAL HIGH (ref 8–23)
CO2: 19 mmol/L — ABNORMAL LOW (ref 22–32)
Calcium: 9.7 mg/dL (ref 8.9–10.3)
Chloride: 110 mmol/L (ref 98–111)
Creatinine, Ser: 2.1 mg/dL — ABNORMAL HIGH (ref 0.61–1.24)
GFR calc Af Amer: 37 mL/min — ABNORMAL LOW (ref >60)
GFR calc non Af Amer: 32 mL/min — ABNORMAL LOW (ref >60)
Glucose, Bld: 95 mg/dL (ref 70–99)
Potassium: 4.4 mmol/L (ref 3.5–5.1)
Sodium: 138 mmol/L (ref 135–145)

## 2018-11-02 LAB — CBC WITH DIFFERENTIAL/PLATELET
Abs Immature Granulocytes: 0.11 10*3/uL — ABNORMAL HIGH (ref 0.00–0.07)
Basophils Absolute: 0 10*3/uL (ref 0.0–0.1)
Basophils Relative: 0 %
Eosinophils Absolute: 0 10*3/uL (ref 0.0–0.5)
Eosinophils Relative: 0 %
HCT: 40.2 % (ref 39.0–52.0)
Hemoglobin: 12.8 g/dL — ABNORMAL LOW (ref 13.0–17.0)
Immature Granulocytes: 1 %
Lymphocytes Relative: 7 %
Lymphs Abs: 0.8 10*3/uL (ref 0.7–4.0)
MCH: 29.2 pg (ref 26.0–34.0)
MCHC: 31.8 g/dL (ref 30.0–36.0)
MCV: 91.8 fL (ref 80.0–100.0)
Monocytes Absolute: 0.8 10*3/uL (ref 0.1–1.0)
Monocytes Relative: 7 %
Neutro Abs: 9.6 10*3/uL — ABNORMAL HIGH (ref 1.7–7.7)
Neutrophils Relative %: 85 %
Platelets: 288 10*3/uL (ref 150–400)
RBC: 4.38 MIL/uL (ref 4.22–5.81)
RDW: 13.6 % (ref 11.5–15.5)
WBC: 11.3 10*3/uL — ABNORMAL HIGH (ref 4.0–10.5)
nRBC: 0 % (ref 0.0–0.2)

## 2018-11-02 MED ORDER — CLINDAMYCIN HCL 300 MG PO CAPS: 600.0000 mg | ORAL_CAPSULE | Freq: Three times a day (TID) | ORAL | 0 refills | Status: AC

## 2018-11-02 MED ORDER — CLINDAMYCIN HCL 300 MG PO CAPS
600.0000 mg | ORAL_CAPSULE | Freq: Three times a day (TID) | ORAL | Status: DC
  Filled 2018-11-02 (×2): qty 2

## 2018-11-02 MED ORDER — ACETAMINOPHEN 325 MG PO TABS: 650.0000 mg | ORAL_TABLET | Freq: Four times a day (QID) | ORAL | 0 refills | Status: DC | PRN

## 2018-11-02 NOTE — Discharge Summary (Signed)
Physician Discharge Summary  Larry [name] [id number] DOB: 11/12/1950 DOA: 10/27/2018  PCP: Lin Landsman, MD  Admit date: 10/27/2018 Discharge date: 11/02/2018  Admitted From: Home  Disposition:  Home   Recommendations for Outpatient Follow-up and new medication changes:  1. Follow up with Dr. Ayesha Rumpf in 7 days.  2. Follow up with Dr. Radford Pax from dental service.  3. Continue Clindamycin for the next 5 days.  4. Discontinue spironolactone due to risk of hyperkalemia.   Home Health: no   Equipment/Devices: no    Discharge Condition: stable  CODE STATUS: full  Diet recommendation: heart healthy.   Brief/Interim Summary: 68 year old male who presented to sore throat. He does have significant past medical history for ventricular tachycardia, torsades, systolic heart failure, chronic kidney disease stage III, COPD, coronary artery disease, hypertension, dyslipidemia obstructive sleep apnea, history of colon cancer status post colostomy. He reported 1 week of persistent sore throat to the point where he had difficulty opening his mouth,and not able to eat for the last 3 days prior to hospitalization. On his initial physical examination he was afebrile. Blood pressure 117/79, heart rate 92, respiratory rate 13, oxygen saturation 100%. He had difficulty opening his mouth, his left lower face was edematous, tender left-sided anterior cervical lymphadenopathy,his lungs were clear to auscultation bilaterally, heart S1-S2 present and rhythmic, abdomen soft, lower extremities with trace edema. Chronic venous stasis dermatitis of the lower extremities. Sodium 136, potassium 3.9, chloride 99, bicarb 22, glucose 104, BUN 27, creatinine 2.77,white count 9.4, hemoglobin 13.7, hematocrit 43.4, platelets 240. SARS COVID-19 was negative. Group A strep PCR negative. Soft tissue CT scan with  asymmetric enlargement of her left palatine tonsil related to acute pharyngitis or tonsillitis.  Inflammatory changes about the left submandibular gland extending into the left platysma likely extends from tonsils. Lucency about the tooth roots of the residual left maxillary molar.  Patient was admitted to the hospital working diagnosis acute pharyngitis/tonsillitis.   1.  Acute pharyngitis/tonsillitis.  Patient was admitted to the medical ward, he received IV antibiotic therapy, IV analgesics and intravenous fluids.  Patient responded well to medical therapy with slowly and progressive improvement of his throat pain, and dysphagia.  A respiratory viral panel was negative.  Follow-up CT showing persistent inflammatory changes involving the left submandibular gland, surrounding submandibular space, likely sequela of acute sialoadenitis.  Slightly improvement of his asymmetric hypertrophy of the left palatine tonsil and mucosal edema.  No abscess identified.  ENT was consulted, recommendations to follow-up with a dental evaluation, likely left maxillary periodontal disease culprit of current infection.  Received 1 dose of Decadron.  Unable to obtain a Panorex film due to wide neck.  Patient will be discharged home to continue antibiotic therapy with clindamycin for 5 more days.  Outpatient dental evaluation.  2.  Acute kidney injury on chronic kidney disease stage III.  Patient received supportive medical therapy, his kidney function has been stable, discharge creatinine is 2.1, potassium 4.4.  Will recommend to hold on spironolactone due to risk of hyperkalemia.  3.  Diastolic heart failure, chronic and stable, ischemic cardiomyopathy.  Patient remained euvolemic, continue aspirin and clopidogrel.  To resume diuretic therapy with furosemide, hold spironolactone due to risk of hyperkalemia.  Continue metoprolol.  4.  History of nonsustained ventricular tachycardia.  Patient remained on remote telemetry monitor, sinus rhythm, continue metoprolol and diltiazem.  5.  Obesity.  Calculated BMI 47.2      Discharge Diagnoses:  Principal Problem:   Pharyngitis Active Problems:  Essential hypertension   CKD (chronic kidney disease), stage III ([facility])   Chronic diastolic CHF (congestive heart failure) ([place])   Acute kidney injury superimposed on chronic kidney disease III ([place])   Poor dentition   Tonsillitis    Discharge Instructions   Allergies as of 11/02/2018   No Known Allergies     Medication List    STOP taking these medications   levalbuterol 45 MCG/ACT inhaler Commonly known as: XOPENEX HFA   spironolactone 25 MG tablet Commonly known as: ALDACTONE     TAKE these medications   acetaminophen 325 MG tablet Commonly known as: TYLENOL Take 2 tablets (650 mg total) by mouth every 6 (six) hours as needed for mild pain (or Fever >/= 101).   aspirin EC 81 MG tablet Take 1 tablet (81 mg total) by mouth daily.   atorvastatin 40 MG tablet Commonly known as: LIPITOR TAKE 1 TABLET BY MOUTH EVERY DAY What changed: when to take this   CENTRUM SILVER 50+MEN PO Take 1 tablet by mouth daily.   clindamycin 300 MG capsule Commonly known as: CLEOCIN Take 2 capsules (600 mg total) by mouth every 8 (eight) hours for 5 days.   clopidogrel 75 MG tablet Commonly known as: PLAVIX Take 75 mg by mouth daily.   diltiazem 90 MG tablet Commonly known as: CARDIZEM Take 1 tablet (90 mg total) by mouth 3 (three) times daily. What changed: Another medication with the same name was removed. Continue taking this medication, and follow the directions you see here.   ferrous sulfate 325 (65 FE) MG tablet Take 1 tablet (325 mg total) by mouth daily with breakfast.   furosemide 40 MG tablet Commonly known as: LASIX TAKE 1.5 TABLETS (60 MG TOTAL) BY MOUTH DAILY.   Gas-X Extra Strength 125 MG chewable tablet Generic drug: simethicone Chew 125 mg by mouth 3 (three) times daily.   metoprolol tartrate 25 MG tablet Commonly known as: LOPRESSOR TAKE 1.5 TABLETS (37.5 MG TOTAL) BY  MOUTH 2 (TWO) TIMES DAILY.   nitroGLYCERIN 0.4 MG SL tablet Commonly known as: NITROSTAT Place 0.4 mg under the tongue every 5 (five) minutes as needed for chest pain.   tamsulosin 0.4 MG Caps capsule Commonly known as: FLOMAX TAKE 1 CAPSULE BY MOUTH EVERY DAY      Follow-up Information    Turner,Benjamin DDS Follow up.   Contact information: [phone].          No Known Allergies  Consultations:  ENT    Procedures/Studies: Ct Soft Tissue Neck Wo Contrast  Result Date: 10/30/2018 CLINICAL DATA:  Initial evaluation for left-sided pharyngitis/tonsillitis with submandibular swelling, no improvement with antibiotic therapy. Evaluate for abscess. EXAM: CT NECK WITHOUT CONTRAST TECHNIQUE: Multidetector CT imaging of the neck was performed following the standard protocol without intravenous contrast. COMPARISON:  Recent neck CT from 10/27/2018. FINDINGS: Pharynx and larynx: Examination mildly degraded by motion artifact. Oral cavity within normal limits without discrete mass or loculated collection. Few scattered dental caries and periapical lucencies noted about the remaining dentition, most notable at the residual left maxillary and mandibular molars. Mild asymmetric enlargement of the left palatine tonsil as compared to the right, perhaps slightly diminished from previous, and favored to be reactive in nature. Superimposed small calcified tonsillith noted on the left. Hazy inflammatory stranding and induration present within the adjacent left parapharyngeal fat. Asymmetric enlargement of the left pterygoid musculature, likely reflecting a degree of associated myositis, similar to perhaps slightly improved from previous. Mild mucosal edema within the  left oropharynx, suggesting a degree of pharyngitis, favored to be secondary and reactive. Nasopharynx within normal limits. No retropharyngeal collection. Epiglottis normal. Vallecula clear. Remainder of the hypopharynx and supraglottic  larynx within normal limits. True cords symmetric and normal. Subglottic airway bowed to the left but patent and clear. Salivary glands: Parotid glands and right submandibular gland are within normal limits. Asymmetric enlargement with inflammatory changes about the left submandibular gland, suggesting acute solid adenitis. Superimposed calcified tonsillith noted at the posterior aspect of the left submandibular gland (series 3, image 48). No obstructive stone seen within the left Wharton's duct. Hazy inflammatory stranding with swelling throughout the adjacent left submandibular space, similar to previous. Asymmetric thickening and irregularity of the left platysmas. Inflammatory stranding extends into the adjacent left parapharyngeal space. No discrete abscess or drainable fluid collection identified on this noncontrast examination. Thyroid: Thyroid within normal limits. Lymph nodes: Mild asymmetric prominence of left level IB and II subcentimeter lymph nodes, likely reactive. No other pathologically enlarged lymph nodes identified within the neck. Vascular: Moderate atherosclerotic change about the aortic arch, carotid bifurcations, as well as the visualized carotid siphons. Limited intracranial: Unremarkable. Visualized orbits: Visualized globes and orbital soft tissues demonstrate no acute finding. Mastoids and visualized paranasal sinuses: Mild mucoperiosteal thickening noted within the left ethmoidal air cells as well as the left maxillary sinus. Protrusion of the root of the left third maxillary molar into the floor of the left maxillary sinus again noted, which could contribute to underlying sinus disease. Visualized mastoid air cells and middle ear cavities are well pneumatized and free of fluid. Skeleton: No acute osseous finding. Probable benign hemangioma noted within the T4 vertebral body. No worrisome osseous lesions. Bulky anterior endplate osteophytic spurring noted within the cervical spine. Upper  chest: Visualized upper chest demonstrates no acute finding. Mild mosaic attenuation within the visualized lungs likely reflects a degree of underlying air trapping. Partially visualized lungs are otherwise grossly clear. Other: None. IMPRESSION: 1. Persistent inflammatory changes involving the left submandibular gland and surrounding left submandibular space, with extension into the left parapharyngeal space, favored to reflect sequelae of acute sialoadenitis. No obstructive stone identified. No discrete abscess or drainable fluid collection. 2. Associated mild asymmetric hypertrophy of the left palatine tonsil with mucosal edema within the left pharynx, slightly improved as compared to prior CT from 10/27/2018, and favored to be secondary in nature. 3. Poor dentition about the remaining teeth. 4. Moderate atherosclerosis. Electronically Signed   By: Jeannine Boga M.D.   On: 10/30/2018 21:21   Ct Soft Tissue Neck Wo Contrast  Result Date: 10/27/2018 CLINICAL DATA:  Neck pain, infection suspected. Patient states headache and sore throat for 3 days. EXAM: CT NECK WITHOUT CONTRAST TECHNIQUE: Multidetector CT imaging of the neck was performed following the standard protocol without intravenous contrast. COMPARISON:  None. FINDINGS: Pharynx and larynx: No focal mucosal or submucosal lesions are present. The left palatine tonsil is enlarged. No definite abscess is present. There is some stranding of left parapharyngeal fat. Salivary glands: There is some stranding about the left submandibular gland. No duct dilation is evident. The right submandibular gland and bilateral parotid glands are within normal limits. Thyroid: Normal Lymph nodes: Significant cervical adenopathy is present. Small level 2 lymph nodes are present bilaterally. Reactive left submandibular lymph nodes are present. Vascular: Dense atherosclerotic calcifications are present at the carotid bifurcations bilaterally. Additional calcifications  are present in the proximal great vessels. Limited intracranial: Intracranial atherosclerotic calcifications are present. No focal parenchymal abnormalities are present.  Visualized orbits: Globes and orbits are within normal limits. Mastoids and visualized paranasal sinuses: Mucosal thickening is present along the floor of the left maxillary sinus. There is marked lucency about the residual left maxillary molar tooth with tooth roots protruding into the sinus. Skeleton: Multilevel degenerative changes are present in the cervical spine. No focal lytic or blastic lesions are present. Upper chest: The lung apices are clear.  Thoracic inlet is normal. IMPRESSION: 1. Asymmetric enlargement of the left palatine tonsil may related to acute pharyngitis or tonsillitis. No discrete abscess is present. 2. Inflammatory changes about the left submandibular gland extending into the left platysma likely extends from the tonsil. 3. Reactive lymph nodes. 4. Atherosclerotic changes are noted in the proximal great vessels and at the carotid bifurcations bilaterally. 5. Multilevel degenerative changes in the cervical spine. 6. Lucency about the tooth roots of the residual left maxillary molar. The roots extend into the left maxillary sinus with there is mucosal thickening along the floor. This may be related. Electronically Signed   By: San Morelle M.D.   On: 10/27/2018 21:29      Procedures:   Subjective: Patient is feeling better, improve dysphagia and odinophagia, no nausea or vomiting, no chest pain or dyspnea.   Discharge Exam: Vitals:   11/01/18 2203 11/02/18 0738  BP: 116/69 111/69  Pulse: 78   Resp: 18 (!) 24  Temp: 98.3 F (36.8 C) 97.7 F (36.5 C)  SpO2: 99% 98%   Vitals:   11/01/18 0910 11/01/18 1620 11/01/18 2203 11/02/18 0738  BP: 126/73 119/70 116/69 111/69  Pulse: 82 77 78   Resp: 17 19 18  (!) 24  Temp: 97.6 F (36.4 C) 98.6 F (37 C) 98.3 F (36.8 C) 97.7 F (36.5 C)  TempSrc:  Oral Oral Oral Oral  SpO2: 97% 100% 99% 98%  Weight:      Height:        General: Not in pain or dyspnea Neurology: Awake and alert, non focal  E ENT: no pallor, no icterus, oral mucosa moist. Left neck edema, tender to palpation, increased mouth opening.  Cardiovascular: No JVD. S1-S2 present, rhythmic, no gallops, rubs, or murmurs. Positive non pitting lower extremity edema. Pulmonary: positive breath sounds bilaterally, adequate air movement, no wheezing, rhonchi or rales. Gastrointestinal. Abdomen with no organomegaly, non tender, no rebound or guarding Skin. No rashes Musculoskeletal: no joint deformities   The results of significant diagnostics from this hospitalization (including imaging, microbiology, ancillary and laboratory) are listed below for reference.     Microbiology: Recent Results (from the past 240 hour(s))  SARS Coronavirus 2 [facility] order, Performed in [facility] hospital lab) Nasopharyngeal Nasopharyngeal Swab     Status: None   Collection Time: 10/27/18  9:20 PM   Specimen: Nasopharyngeal Swab  Result Value Ref Range Status   SARS Coronavirus 2 NEGATIVE NEGATIVE Final    Comment: (NOTE) If result is NEGATIVE SARS-CoV-2 target nucleic acids are NOT DETECTED. The SARS-CoV-2 RNA is generally detectable in upper and lower  respiratory specimens during the acute phase of infection. The lowest  concentration of SARS-CoV-2 viral copies this assay can detect is 250  copies / mL. A negative result does not preclude SARS-CoV-2 infection  and should not be used as the sole basis for treatment or other  patient management decisions.  A negative result may occur with  improper specimen collection / handling, submission of specimen other  than nasopharyngeal swab, presence of viral mutation(s) within the  areas targeted by  this assay, and inadequate number of viral copies  (<250 copies / mL). A negative result must be combined with clinical  observations, patient  history, and epidemiological information. If result is POSITIVE SARS-CoV-2 target nucleic acids are DETECTED. The SARS-CoV-2 RNA is generally detectable in upper and lower  respiratory specimens dur ing the acute phase of infection.  Positive  results are indicative of active infection with SARS-CoV-2.  Clinical  correlation with patient history and other diagnostic information is  necessary to determine patient infection status.  Positive results do  not rule out bacterial infection or co-infection with other viruses. If result is PRESUMPTIVE POSTIVE SARS-CoV-2 nucleic acids MAY BE PRESENT.   A presumptive positive result was obtained on the submitted specimen  and confirmed on repeat testing.  While 2019 novel coronavirus  (SARS-CoV-2) nucleic acids may be present in the submitted sample  additional confirmatory testing may be necessary for epidemiological  and / or clinical management purposes  to differentiate between  SARS-CoV-2 and other Sarbecovirus currently known to infect humans.  If clinically indicated additional testing with an alternate test  methodology [phone]) is advised. The SARS-CoV-2 RNA is generally  detectable in upper and lower respiratory sp ecimens during the acute  phase of infection. The expected result is Negative. Fact Sheet for Patients:  StrictlyIdeas.no Fact Sheet for Healthcare Providers: BankingDealers.co.za This test is not yet approved or cleared by the [place] FDA and has been authorized for detection and/or diagnosis of SARS-CoV-2 by FDA under an Emergency Use Authorization (EUA).  This EUA will remain in effect (meaning this test can be used) for the duration of the COVID-19 declaration under Section 564(b)(1) of the Act, 21 U.S.C. section 360bbb-3(b)(1), unless the authorization is terminated or revoked sooner. Performed at [facility], [place] [address], [place], [place] [address]    Group A Strep by PCR     Status: None   Collection Time: 10/27/18  9:28 PM   Specimen: Throat; Sterile Swab  Result Value Ref Range Status   Group A Strep by PCR NOT DETECTED NOT DETECTED Final    Comment: Performed at [facility], [place] [address], [place], [place] [address]  Respiratory Panel by PCR     Status: None   Collection Time: 10/28/18  3:28 AM   Specimen: Nasopharyngeal Swab; Respiratory  Result Value Ref Range Status   Adenovirus NOT DETECTED NOT DETECTED Final   Coronavirus 229E NOT DETECTED NOT DETECTED Final    Comment: (NOTE) The Coronavirus on the Respiratory Panel, DOES NOT test for the novel  Coronavirus (2019 nCoV)    Coronavirus HKU1 NOT DETECTED NOT DETECTED Final   Coronavirus NL63 NOT DETECTED NOT DETECTED Final   Coronavirus OC43 NOT DETECTED NOT DETECTED Final   Metapneumovirus NOT DETECTED NOT DETECTED Final   Rhinovirus / Enterovirus NOT DETECTED NOT DETECTED Final   Influenza A NOT DETECTED NOT DETECTED Final   Influenza B NOT DETECTED NOT DETECTED Final   Parainfluenza Virus 1 NOT DETECTED NOT DETECTED Final   Parainfluenza Virus 2 NOT DETECTED NOT DETECTED Final   Parainfluenza Virus 3 NOT DETECTED NOT DETECTED Final   Parainfluenza Virus 4 NOT DETECTED NOT DETECTED Final   Respiratory Syncytial Virus NOT DETECTED NOT DETECTED Final   Bordetella pertussis NOT DETECTED NOT DETECTED Final   Chlamydophila pneumoniae NOT DETECTED NOT DETECTED Final   Mycoplasma pneumoniae NOT DETECTED NOT DETECTED Final    Comment: Performed at [facility], [place]  [address], [place], [place] [address]     Labs: BNP (last 3 results) No results for input(s): BNP in the last 8760 hours. Basic Metabolic Panel: Recent Labs  Lab 10/28/18 0529 10/29/18 0502 10/31/18 0412 11/01/18 0357 11/02/18 0830  NA 137 138 137 136 138  K 4.1 4.2 4.8 4.5 4.4  CL 104 105 107 108 110  CO2 20* 21* 20* 18* 19*  GLUCOSE 157* 116* 129* 133* 95  BUN 27* 26* 24* 28* 35*   CREATININE 2.37* 2.02* 1.97* 1.89* 2.10*  CALCIUM 9.6 9.2 9.5 9.4 9.7   Liver Function Tests: No results for input(s): AST, ALT, ALKPHOS, BILITOT, PROT, ALBUMIN in the last 168 hours. No results for input(s): LIPASE, AMYLASE in the last 168 hours. No results for input(s): AMMONIA in the last 168 hours. CBC: Recent Labs  Lab 10/27/18 1847 10/31/18 0412 11/01/18 0357 11/02/18 0830  WBC 9.4 7.9 11.3* 11.3*  NEUTROABS 7.4  --  10.2* 9.6*  HGB 13.7 12.3* 12.4* 12.8*  HCT 43.4 38.4* 39.2 40.2  MCV 92.9 92.8 91.8 91.8  PLT 240 257 290 288   Cardiac Enzymes: No results for input(s): CKTOTAL, CKMB, CKMBINDEX, TROPONINI in the last 168 hours. BNP: Invalid input(s): POCBNP CBG: No results for input(s): GLUCAP in the last 168 hours. D-Dimer No results for input(s): DDIMER in the last 72 hours. Hgb A1c No results for input(s): HGBA1C in the last 72 hours. Lipid Profile No results for input(s): CHOL, HDL, LDLCALC, TRIG, CHOLHDL, LDLDIRECT in the last 72 hours. Thyroid function studies No results for input(s): TSH, T4TOTAL, T3FREE, THYROIDAB in the last 72 hours.  Invalid input(s): FREET3 Anemia work up No results for input(s): VITAMINB12, FOLATE, FERRITIN, TIBC, IRON, RETICCTPCT in the last 72 hours. Urinalysis    Component Value Date/Time   COLORURINE AMBER (A) 06/30/2017 1445   APPEARANCEUR CLOUDY (A) 06/30/2017 1445   LABSPEC 1.014 06/30/2017 1445   PHURINE 5.0 06/30/2017 1445   GLUCOSEU NEGATIVE 06/30/2017 1445   HGBUR LARGE (A) 06/30/2017 1445   BILIRUBINUR NEGATIVE 06/30/2017 1445   KETONESUR NEGATIVE 06/30/2017 1445   PROTEINUR 100 (A) 06/30/2017 1445   UROBILINOGEN 0.2 03/04/2013 1020   NITRITE NEGATIVE 06/30/2017 1445   LEUKOCYTESUR MODERATE (A) 06/30/2017 1445   Sepsis Labs Invalid input(s): PROCALCITONIN,  WBC,  LACTICIDVEN Microbiology Recent Results (from the past 240 hour(s))  SARS Coronavirus 2 [facility] order, Performed in [facility] hospital lab)  Nasopharyngeal Nasopharyngeal Swab     Status: None   Collection Time: 10/27/18  9:20 PM   Specimen: Nasopharyngeal Swab  Result Value Ref Range Status   SARS Coronavirus 2 NEGATIVE NEGATIVE Final    Comment: (NOTE) If result is NEGATIVE SARS-CoV-2 target nucleic acids are NOT DETECTED. The SARS-CoV-2 RNA is generally detectable in upper and lower  respiratory specimens during the acute phase of infection. The lowest  concentration of SARS-CoV-2 viral copies this assay can detect is 250  copies / mL. A negative result does not preclude SARS-CoV-2 infection  and should not be used as the sole basis for treatment or other  patient management decisions.  A negative result may occur with  improper specimen collection / handling, submission of specimen other  than nasopharyngeal swab, presence of viral mutation(s) within the  areas targeted by this assay, and inadequate number of viral copies  (<250 copies / mL). A negative result must be combined with clinical  observations, patient history, and epidemiological information. If result is POSITIVE SARS-CoV-2 target nucleic acids are DETECTED. The  SARS-CoV-2 RNA is generally detectable in upper and lower  respiratory specimens dur ing the acute phase of infection.  Positive  results are indicative of active infection with SARS-CoV-2.  Clinical  correlation with patient history and other diagnostic information is  necessary to determine patient infection status.  Positive results do  not rule out bacterial infection or co-infection with other viruses. If result is PRESUMPTIVE POSTIVE SARS-CoV-2 nucleic acids MAY BE PRESENT.   A presumptive positive result was obtained on the submitted specimen  and confirmed on repeat testing.  While 2019 novel coronavirus  (SARS-CoV-2) nucleic acids may be present in the submitted sample  additional confirmatory testing may be necessary for epidemiological  and / or clinical management purposes  to  differentiate between  SARS-CoV-2 and other Sarbecovirus currently known to infect humans.  If clinically indicated additional testing with an alternate test  methodology [phone]) is advised. The SARS-CoV-2 RNA is generally  detectable in upper and lower respiratory sp ecimens during the acute  phase of infection. The expected result is Negative. Fact Sheet for Patients:  StrictlyIdeas.no Fact Sheet for Healthcare Providers: BankingDealers.co.za This test is not yet approved or cleared by the [place] FDA and has been authorized for detection and/or diagnosis of SARS-CoV-2 by FDA under an Emergency Use Authorization (EUA).  This EUA will remain in effect (meaning this test can be used) for the duration of the COVID-19 declaration under Section 564(b)(1) of the Act, 21 U.S.C. section 360bbb-3(b)(1), unless the authorization is terminated or revoked sooner. Performed at [facility] Lab, [place] [address], [place], [place] [address]   Group A Strep by PCR     Status: None   Collection Time: 10/27/18  9:28 PM   Specimen: Throat; Sterile Swab  Result Value Ref Range Status   Group A Strep by PCR NOT DETECTED NOT DETECTED Final    Comment: Performed at [facility], [place] [address] [place], [place] [address]  Respiratory Panel by PCR     Status: None   Collection Time: 10/28/18  3:28 AM   Specimen: Nasopharyngeal Swab; Respiratory  Result Value Ref Range Status   Adenovirus NOT DETECTED NOT DETECTED Final   Coronavirus 229E NOT DETECTED NOT DETECTED Final    Comment: (NOTE) The Coronavirus on the Respiratory Panel, DOES NOT test for the novel  Coronavirus (2019 nCoV)    Coronavirus HKU1 NOT DETECTED NOT DETECTED Final   Coronavirus NL63 NOT DETECTED NOT DETECTED Final   Coronavirus OC43 NOT DETECTED NOT DETECTED Final   Metapneumovirus NOT DETECTED NOT DETECTED Final   Rhinovirus / Enterovirus NOT DETECTED NOT DETECTED Final    Influenza A NOT DETECTED NOT DETECTED Final   Influenza B NOT DETECTED NOT DETECTED Final   Parainfluenza Virus 1 NOT DETECTED NOT DETECTED Final   Parainfluenza Virus 2 NOT DETECTED NOT DETECTED Final   Parainfluenza Virus 3 NOT DETECTED NOT DETECTED Final   Parainfluenza Virus 4 NOT DETECTED NOT DETECTED Final   Respiratory Syncytial Virus NOT DETECTED NOT DETECTED Final   Bordetella pertussis NOT DETECTED NOT DETECTED Final   Chlamydophila pneumoniae NOT DETECTED NOT DETECTED Final   Mycoplasma pneumoniae NOT DETECTED NOT DETECTED Final    Comment: Performed at [facility], [place]. [address], [place], [place] [address]     Time coordinating discharge: 45 minutes  SIGNED:   Tawni Millers, MD  [facility] 11/02/2018, 11:02 AM

## 2018-12-02 ENCOUNTER — Other Ambulatory Visit: Payer: Self-pay | Admitting: Nurse Practitioner

## 2018-12-05 ENCOUNTER — Encounter (HOSPITAL_BASED_OUTPATIENT_CLINIC_OR_DEPARTMENT_OTHER): Payer: BLUE CROSS/BLUE SHIELD | Admitting: Physician Assistant

## 2018-12-12 ENCOUNTER — Other Ambulatory Visit: Payer: Self-pay | Admitting: Nurse Practitioner

## 2018-12-17 ENCOUNTER — Other Ambulatory Visit: Payer: Self-pay | Admitting: Gastroenterology

## 2018-12-19 ENCOUNTER — Ambulatory Visit (HOSPITAL_BASED_OUTPATIENT_CLINIC_OR_DEPARTMENT_OTHER): Payer: BLUE CROSS/BLUE SHIELD | Admitting: Physician Assistant

## 2018-12-27 ENCOUNTER — Encounter (HOSPITAL_COMMUNITY): Payer: Self-pay | Admitting: *Deleted

## 2018-12-27 ENCOUNTER — Other Ambulatory Visit: Payer: Self-pay

## 2018-12-27 ENCOUNTER — Other Ambulatory Visit (HOSPITAL_COMMUNITY)
Admission: RE | Admit: 2018-12-27 | Discharge: 2018-12-27 | Disposition: A | Discharge status: Home / Self Care | Payer: Medicare Other | Source: Ambulatory Visit | Attending: Gastroenterology | Admitting: Gastroenterology

## 2018-12-27 DIAGNOSIS — Z01812 Encounter for preprocedural laboratory examination: Secondary | ICD-10-CM | POA: Diagnosis not present

## 2018-12-27 DIAGNOSIS — Z20828 Contact with and (suspected) exposure to other viral communicable diseases: Secondary | ICD-10-CM | POA: Diagnosis not present

## 2018-12-27 LAB — SARS CORONAVIRUS 2 (TAT 6-24 HRS): SARS Coronavirus 2: NEGATIVE

## 2018-12-28 ENCOUNTER — Ambulatory Visit (HOSPITAL_COMMUNITY)
Admission: RE | Admit: 2018-12-28 | Discharge: 2018-12-28 | Disposition: A | Discharge status: Home / Self Care | Payer: Medicare Other | Attending: Gastroenterology | Admitting: Gastroenterology

## 2018-12-28 ENCOUNTER — Ambulatory Visit (HOSPITAL_COMMUNITY): Payer: Medicare Other | Admitting: Anesthesiology

## 2018-12-28 ENCOUNTER — Encounter (HOSPITAL_COMMUNITY)
Admission: RE | Disposition: A | Discharge status: Home / Self Care | Payer: Self-pay | Source: Home / Self Care | Attending: Gastroenterology

## 2018-12-28 ENCOUNTER — Encounter (HOSPITAL_COMMUNITY): Payer: Self-pay | Admitting: *Deleted

## 2018-12-28 DIAGNOSIS — E669 Obesity, unspecified: Secondary | ICD-10-CM | POA: Diagnosis not present

## 2018-12-28 DIAGNOSIS — E7800 Pure hypercholesterolemia, unspecified: Secondary | ICD-10-CM | POA: Diagnosis not present

## 2018-12-28 DIAGNOSIS — D124 Benign neoplasm of descending colon: Secondary | ICD-10-CM | POA: Insufficient documentation

## 2018-12-28 DIAGNOSIS — Z87891 Personal history of nicotine dependence: Secondary | ICD-10-CM | POA: Diagnosis not present

## 2018-12-28 DIAGNOSIS — I130 Hypertensive heart and chronic kidney disease with heart failure and stage 1 through stage 4 chronic kidney disease, or unspecified chronic kidney disease: Secondary | ICD-10-CM | POA: Insufficient documentation

## 2018-12-28 DIAGNOSIS — M1990 Unspecified osteoarthritis, unspecified site: Secondary | ICD-10-CM | POA: Insufficient documentation

## 2018-12-28 DIAGNOSIS — Z1211 Encounter for screening for malignant neoplasm of colon: Secondary | ICD-10-CM | POA: Insufficient documentation

## 2018-12-28 DIAGNOSIS — G4733 Obstructive sleep apnea (adult) (pediatric): Secondary | ICD-10-CM | POA: Insufficient documentation

## 2018-12-28 DIAGNOSIS — Z6841 Body Mass Index (BMI) 40.0 and over, adult: Secondary | ICD-10-CM | POA: Diagnosis not present

## 2018-12-28 DIAGNOSIS — J45909 Unspecified asthma, uncomplicated: Secondary | ICD-10-CM | POA: Diagnosis not present

## 2018-12-28 DIAGNOSIS — N183 Chronic kidney disease, stage 3 unspecified: Secondary | ICD-10-CM | POA: Diagnosis not present

## 2018-12-28 DIAGNOSIS — K5730 Diverticulosis of large intestine without perforation or abscess without bleeding: Secondary | ICD-10-CM | POA: Insufficient documentation

## 2018-12-28 DIAGNOSIS — J449 Chronic obstructive pulmonary disease, unspecified: Secondary | ICD-10-CM | POA: Diagnosis not present

## 2018-12-28 DIAGNOSIS — K635 Polyp of colon: Secondary | ICD-10-CM | POA: Diagnosis not present

## 2018-12-28 DIAGNOSIS — I5042 Chronic combined systolic (congestive) and diastolic (congestive) heart failure: Secondary | ICD-10-CM | POA: Diagnosis not present

## 2018-12-28 DIAGNOSIS — Z85038 Personal history of other malignant neoplasm of large intestine: Secondary | ICD-10-CM | POA: Diagnosis not present

## 2018-12-28 DIAGNOSIS — I739 Peripheral vascular disease, unspecified: Secondary | ICD-10-CM | POA: Diagnosis not present

## 2018-12-28 DIAGNOSIS — G4730 Sleep apnea, unspecified: Secondary | ICD-10-CM | POA: Diagnosis not present

## 2018-12-28 DIAGNOSIS — I2510 Atherosclerotic heart disease of native coronary artery without angina pectoris: Secondary | ICD-10-CM | POA: Insufficient documentation

## 2018-12-28 HISTORY — PX: COLONOSCOPY WITH PROPOFOL: SHX5780

## 2018-12-28 HISTORY — PX: POLYPECTOMY: SHX5525

## 2018-12-28 SURGERY — COLONOSCOPY WITH PROPOFOL
Anesthesia: Monitor Anesthesia Care

## 2018-12-28 MED ORDER — LABETALOL HCL 5 MG/ML IV SOLN
INTRAVENOUS | Status: DC | PRN
  Administered 2018-12-28 (×2): 2.5 mg via INTRAVENOUS
  Administered 2018-12-28: 5 mg via INTRAVENOUS

## 2018-12-28 MED ORDER — PROPOFOL 500 MG/50ML IV EMUL
INTRAVENOUS | Status: DC | PRN
  Administered 2018-12-28: 150 ug/kg/min via INTRAVENOUS

## 2018-12-28 MED ORDER — PROPOFOL 10 MG/ML IV BOLUS
INTRAVENOUS | Status: AC
  Filled 2018-12-28: qty 20

## 2018-12-28 MED ORDER — LACTATED RINGERS IV SOLN
INTRAVENOUS | Status: DC | PRN
  Administered 2018-12-28: 08:00:00 via INTRAVENOUS

## 2018-12-28 MED ORDER — SODIUM CHLORIDE 0.9 % IV SOLN: INTRAVENOUS | Status: DC

## 2018-12-28 MED ORDER — PROPOFOL 500 MG/50ML IV EMUL
INTRAVENOUS | Status: AC
  Filled 2018-12-28: qty 50

## 2018-12-28 MED ORDER — ESMOLOL HCL 100 MG/10ML IV SOLN
INTRAVENOUS | Status: DC | PRN
  Administered 2018-12-28: 30 mg via INTRAVENOUS

## 2018-12-28 MED ORDER — LIDOCAINE HCL 1 % IJ SOLN
INTRAMUSCULAR | Status: DC | PRN
  Administered 2018-12-28 (×2): 40 mg via INTRADERMAL

## 2018-12-28 SURGICAL SUPPLY — 21 items

## 2018-12-28 NOTE — Op Note (Signed)
[facility] Patient Name: Larry [name] Procedure Date: 12/28/2018 MRN: [id number] Attending MD: [name] , MD Date of Birth: 1950/11/23 CSN: [id number] Age: 68 Admit Type: Outpatient Procedure:                Colonoscopy Indications:              High risk colon cancer surveillance: Personal                            history of colon cancer Providers:                [name], MD, Baird Cancer, RN, Lazaro Arms,                            Technician Referring MD:              Medicines:                 Complications:            No immediate complications. Estimated Blood Loss:     Estimated blood loss was minimal. Procedure:                Pre-Anesthesia Assessment:                           - Prior to the procedure, a History and Physical                            was performed, and patient medications and                            allergies were reviewed. The patient's tolerance of                            previous anesthesia was also reviewed. The risks                            and benefits of the procedure and the sedation                            options and risks were discussed with the patient.                            All questions were answered, and informed consent                            was obtained. Prior Anticoagulants: The patient has                            taken no previous anticoagulant or antiplatelet                            agents. ASA Grade Assessment: III - A patient with                            severe systemic disease.  After reviewing the risks                            and benefits, the patient was deemed in                            satisfactory condition to undergo the procedure.                           - Sedation was administered by an anesthesia                            professional. Deep sedation was attained.                           After obtaining informed consent, the colonoscope                was passed under direct vision. Throughout the                            procedure, the patient's blood pressure, pulse, and                            oxygen saturations were monitored continuously. The                            [id number]) Olympus pediatric colonscope                            was introduced through the anus and advanced to the                            the colocolonic anastomosis. The colonoscopy was                            technically difficult and complex due to poor bowel                            prep. The patient tolerated the procedure well. The                            quality of the bowel preparation was poor. Scope In: 8:59:00 AM Scope Out: 9:19:18 AM Total Procedure Duration: 0 hours 20 minutes 18 seconds  Findings:      Three sessile polyps were found in the descending colon. The polyps were       3 to 5 mm in size. These polyps were removed with a cold snare.       Resection was complete, but the polyp tissue was only partially       retrieved.      Scattered small and large-mouthed diverticula were found in the sigmoid       colon and descending colon.      The patient has a contiguous colon, but he has a significant amount of       retained solid stool as well as large stool balls. This  material is not       exiting the colon as the patient has a loop ileostomy. His anastamosis       failed one week after the surgery in 03/2016. Impression:               - Preparation of the colon was poor.                           - Three 3 to 5 mm polyps in the descending colon,                            removed with a cold snare. Complete resection.                            Partial retrieval.                           - Diverticulosis in the sigmoid colon and in the                            descending colon. Moderate Sedation:      Not Applicable - Patient had care per Anesthesia. Recommendation:           - Patient has a contact  number available for                            emergencies. The signs and symptoms of potential                            delayed complications were discussed with the                            patient. Return to normal activities tomorrow.                            Written discharge instructions were provided to the                            patient.                           - Resume regular diet.                           - Repeat colonoscopy at the next available                            appointment because the bowel preparation was                            suboptimal. Procedure Code(s):        --- Professional ---                           [phone], Colonoscopy, flexible; with removal of  tumor(s), polyp(s), or other lesion(s) by snare                            technique Diagnosis Code(s):        --- Professional ---                           K63.5, Polyp of colon                           Z85.038, Personal history of other malignant                            neoplasm of large intestine                           K57.30, Diverticulosis of large intestine without                            perforation or abscess without bleeding CPT copyright 2019 [employer]. All rights reserved. The codes documented in this report are preliminary and upon coder review may  be revised to meet current compliance requirements. [name], MD [name], MD 12/28/2018 9:31:48 AM This report has been signed electronically. Number of Addenda: 0

## 2018-12-28 NOTE — Discharge Instructions (Signed)

## 2018-12-28 NOTE — Anesthesia Postprocedure Evaluation (Signed)
Anesthesia Post Note  Patient: Larry [name]  Procedure(s) Performed: COLONOSCOPY WITH PROPOFOL (N/A ) POLYPECTOMY     Patient location during evaluation: PACU Anesthesia Type: MAC Level of consciousness: awake and alert Pain management: pain level controlled Vital Signs Assessment: post-procedure vital signs reviewed and stable Respiratory status: spontaneous breathing, nonlabored ventilation, respiratory function stable and patient connected to nasal cannula oxygen Cardiovascular status: stable and blood pressure returned to baseline Postop Assessment: no apparent nausea or vomiting Anesthetic complications: no    Last Vitals:  Vitals:   12/28/18 0940 12/28/18 0950  BP: (!) 158/78   Pulse: (!) 108   Resp: 19 (!) 21  Temp:    SpO2: 99% 99%    Last Pain:  Vitals:   12/28/18 0950  TempSrc:   PainSc: 0-No pain                 Maury Groninger S

## 2018-12-28 NOTE — Anesthesia Preprocedure Evaluation (Signed)
Anesthesia Evaluation  Patient identified by MRN, date of birth, ID band Patient awake    Reviewed: Allergy & Precautions, NPO status , Patient's Chart, lab work & pertinent test results  Airway Mallampati: II  TM Distance: >3 FB Neck ROM: Full    Dental no notable dental hx.    Pulmonary sleep apnea and Continuous Positive Airway Pressure Ventilation , COPD, former smoker,    Pulmonary exam normal breath sounds clear to auscultation       Cardiovascular hypertension, Pt. on medications and Pt. on home beta blockers + Peripheral Vascular Disease and +CHF  Normal cardiovascular exam+ dysrhythmias  Rhythm:Regular Rate:Normal     Neuro/Psych negative neurological ROS  negative psych ROS   GI/Hepatic negative GI ROS, Neg liver ROS,   Endo/Other  negative endocrine ROS  Renal/GU negative Renal ROS  negative genitourinary   Musculoskeletal negative musculoskeletal ROS (+)   Abdominal   Peds negative pediatric ROS (+)  Hematology negative hematology ROS (+)   Anesthesia Other Findings   Reproductive/Obstetrics negative OB ROS                             Anesthesia Physical Anesthesia Plan  ASA: IV  Anesthesia Plan: MAC   Post-op Pain Management:    Induction: Intravenous  PONV Risk Score and Plan: 0  Airway Management Planned: Simple Face Mask  Additional Equipment:   Intra-op Plan:   Post-operative Plan:   Informed Consent: I have reviewed the patients History and Physical, chart, labs and discussed the procedure including the risks, benefits and alternatives for the proposed anesthesia with the patient or authorized representative who has indicated his/her understanding and acceptance.     Dental advisory given  Plan Discussed with: CRNA and Surgeon  Anesthesia Plan Comments:         Anesthesia Quick Evaluation

## 2018-12-28 NOTE — Transfer of Care (Signed)
Immediate Anesthesia Transfer of Care Note  Patient: Larry [name]  Procedure(s) Performed: COLONOSCOPY WITH PROPOFOL (N/A ) POLYPECTOMY  Patient Location: PACU and Endoscopy Unit  Anesthesia Type:MAC  Level of Consciousness: awake, oriented and patient cooperative  Airway & Oxygen Therapy: Patient Spontanous Breathing and Patient connected to face mask oxygen  Post-op Assessment: Report given to RN  Post vital signs: Reviewed and stable  Last Vitals:  Vitals Value Taken Time  BP    Temp    Pulse    Resp    SpO2      Last Pain:  Vitals:   12/28/18 0753  TempSrc: Oral  PainSc: 0-No pain         Complications: No apparent anesthesia complications

## 2018-12-28 NOTE — H&P (Signed)
Larry [name] HPI: The patient's colonoscopy was positive for adenomas in the proximal colon. The long Hartman's pouch was difficult to evaluate as a result of large mucus stool balls. Last year a reanastamosis was attempted, but it failed. He continues to have an ostomy and he is still healing with his incisional wound. He mentioned that a reattempt may be undertaken in 1-1.5 years. The patient is suppose to see Dr. Marcello Moores in the next few days   Past Medical History:  Diagnosis Date  . Arthritis   . Atrial tachycardia ([place])    managed on beta blocker therapy  . Childhood asthma    "went away after I was 16"  . Chronic combined systolic and diastolic CHF, NYHA class 3 (HCC)    has diastolic heart failure grade 1; EF is 45 to 50% per echo 05/2011; EF 41% by Myoview 2016  . CKD (chronic kidney disease) stage 3, GFR 30-59 ml/min   . Colon cancer ([place]) 2015   MSI high; IHC loss of MLH1 and PMS2; BRAF negative; Negative methylation  . COPD (chronic obstructive pulmonary disease) ([place])   . Coronary artery disease   . Family history of ovarian cancer   . Hypercholesteremia   . Hypertension   . Noncompliance   . NSVT (nonsustained ventricular tachycardia) (HCC)    beta blocker restarted  . Obesity   . OSA on CPAP    used nightly, pt does not know settings  . Peripheral arterial disease (HCC)    4.2 cm thoracic aortic aneurysm per chest ct 11-14-15 epic  . S/P colostomy ([place])    2014  . Thrombocytopenia ([place])   . Torsades de pointes ([place])    X 2 episodes during hospital visit 12'14"electrolyte imbalance"- "Shocked"    Past Surgical History:  Procedure Laterality Date  . COLON SURGERY    . COLONOSCOPY N/A 02/08/2013   Procedure: COLONOSCOPY;  Surgeon: [name], MD;  Location: [place];  Service: Endoscopy;  Laterality: N/A;  . COLONOSCOPY WITH PROPOFOL N/A 05/09/2014   Procedure: COLONOSCOPY WITH PROPOFOL;  Surgeon: [name], MD;  Location: [facility];   Service: Endoscopy;  Laterality: N/A;  . COLONOSCOPY WITH PROPOFOL N/A 01/08/2016   Procedure: COLONOSCOPY WITH PROPOFOL;  Surgeon: [name], MD;  Location: [facility];  Service: Endoscopy;  Laterality: N/A;  . COLOSTOMY N/A 04/19/2016   Procedure: COLOSTOMY;  Surgeon: Judeth Horn, MD;  Location: [place];  Service: General;  Laterality: N/A;  . COLOSTOMY REVERSAL  04/12/2016  . COLOSTOMY REVERSAL N/A 04/12/2016   Procedure: COLOSTOMY REVERSAL;  Surgeon: Judeth Horn, MD;  Location: [place];  Service: General;  Laterality: N/A;  . CORONARY ANGIOPLASTY WITH STENT PLACEMENT  11/12/2008; 06/11/2014   stent x 2 to RCA; stent x 2 to LAD  . CORONARY ANGIOPLASTY WITH STENT PLACEMENT  06/11/2014   m-LAD 3.5 x 16 mm Synergy DES, d-LAD  2.25 x 16 mm Synergy DES  . CYSTOSCOPY W/ URETERAL STENT PLACEMENT Left 06/30/2017   Procedure: CYSTOSCOPY WITH RETROGRADE PYELOGRAM/URETERAL STENT PLACEMENT;  Surgeon: Lucas Mallow, MD;  Location: [place];  Service: Urology;  Laterality: Left;  . CYSTOSCOPY WITH LITHOLAPAXY N/A 08/02/2017   Procedure: CYSTOSCOPY WITH BLADDER STONE EXTRACTION;  Surgeon: Lucas Mallow, MD;  Location: [facility];  Service: Urology;  Laterality: N/A;  . CYSTOSCOPY WITH RETROGRADE PYELOGRAM, URETEROSCOPY AND STENT PLACEMENT Left 08/02/2017   Procedure: CYSTOSCOPY WITH LEFT RETROGRADE PYELOGRAM, URETEROSCOPY AND STENT EXCHANGE;  Surgeon: Marton Redwood III,  MD;  Location: [facility];  Service: Urology;  Laterality: Left;  . ESOPHAGOGASTRODUODENOSCOPY N/A 02/08/2013   Procedure: ESOPHAGOGASTRODUODENOSCOPY (EGD);  Surgeon: [name], MD;  Location[facility];  Service: Endoscopy;  Laterality: N/A;  . FLEXIBLE SIGMOIDOSCOPY N/A 02/19/2016   Procedure: FLEXIBLE SIGMOIDOSCOPY;  Surgeon: [name], MD;  Location: [facility];  Service: Endoscopy;  Laterality: N/A;  . HOLMIUM LASER APPLICATION Left 09/09/9104   Procedure: HOLMIUM LASER APPLICATION;  Surgeon: Lucas Mallow, MD;  Location:  [facility];  Service: Urology;  Laterality: Left;  . LAPAROTOMY N/A 02/12/2013   Procedure: EXPLORATORY LAPAROTOMY PARTIAL COLECTOMY WITH COLOSTOMY;  Surgeon: [name], MD;  Location: [place];  Service: General;  Laterality: N/A;  . LAPAROTOMY N/A 02/18/2013   Procedure: EXPLORATORY LAPAROTOMY/Closure of Wound;  Surgeon: [name], MD;  Location: [place];  Service: General;  Laterality: N/A;  . LAPAROTOMY N/A 04/19/2016   Procedure: EXPLORATORY LAPAROTOMY, REPAIR OF ANASTAMOTIC LEAK;  Surgeon: Judeth Horn, MD;  Location: [place];  Service: General;  Laterality: N/A;  . LEFT HEART CATHETERIZATION WITH CORONARY ANGIOGRAM N/A 06/11/2014   Procedure: LEFT HEART CATHETERIZATION WITH CORONARY ANGIOGRAM;  Surgeon: [name], MD; CFX calcified, 30-40 percent, RCA calcified, 40/50/40%, PDA diffuse disease, LAD 40/75/90% s/p DES 2     Family History  Problem Relation Age of Onset  . Hypertension Father   . Heart disease Father        before age 58  . Diabetes Father   . Hypertension Mother   . Dementia Mother   . Parkinsonism Mother   . Liver cancer Maternal Grandmother        dx in her 56s  . Prostate cancer Maternal Uncle   . Leukemia Maternal Uncle     Social History:  reports that he quit smoking about 10 years ago. His smoking use included cigarettes. He has a 38.00 pack-year smoking history. He has never used smokeless tobacco. He reports current alcohol use. He reports that he does not use drugs.  Allergies: No Known Allergies  Medications:  Scheduled:  Continuous: . sodium chloride      Results for orders placed or performed during the hospital encounter of 12/27/18 (from the past 24 hour(s))  SARS CORONAVIRUS 2 (TAT 6-24 HRS) Nasopharyngeal Nasopharyngeal Swab     Status: None   Collection Time: 12/27/18 12:10 PM   Specimen: Nasopharyngeal Swab  Result Value Ref Range   SARS Coronavirus 2 NEGATIVE NEGATIVE     No results found.  ROS:  As stated above in the HPI  otherwise negative.  Blood pressure (!) 173/76, temperature 98.9 F (37.2 C), temperature source Oral, resp. rate 17, height _0  (1.803 m), weight (!) 151.5 kg, SpO2 100 %.    PE: Gen: NAD, Alert and Oriented HEENT:  [place]/AT, EOMI Neck: Supple, no LAD Lungs: CTA Bilaterally CV: RRR without M/G/R ABM: Soft, NTND, +BS Ext: No C/C/E  Assessment/Plan: 1) Personal history of polyps and personal history of colon cancer - colonoscopy.  Nathanyel Defenbaugh D 12/28/2018, 8:25 AM

## 2018-12-31 LAB — SURGICAL PATHOLOGY

## 2019-01-01 ENCOUNTER — Other Ambulatory Visit: Payer: Self-pay | Admitting: Nurse Practitioner

## 2019-01-01 DIAGNOSIS — I5022 Chronic systolic (congestive) heart failure: Secondary | ICD-10-CM

## 2019-01-01 DIAGNOSIS — E7800 Pure hypercholesterolemia, unspecified: Secondary | ICD-10-CM

## 2019-01-01 DIAGNOSIS — R0602 Shortness of breath: Secondary | ICD-10-CM

## 2019-01-01 DIAGNOSIS — I259 Chronic ischemic heart disease, unspecified: Secondary | ICD-10-CM

## 2019-01-01 DIAGNOSIS — I471 Supraventricular tachycardia: Secondary | ICD-10-CM

## 2019-01-02 ENCOUNTER — Encounter (HOSPITAL_BASED_OUTPATIENT_CLINIC_OR_DEPARTMENT_OTHER): Payer: Medicare Other | Admitting: Physician Assistant

## 2019-01-03 ENCOUNTER — Other Ambulatory Visit: Payer: Self-pay | Admitting: Nurse Practitioner

## 2019-01-03 NOTE — Progress Notes (Signed)
CARDIOLOGY OFFICE NOTE  Date:  01/09/2019    Larry [name] Date of Birth: [birth year] Medical Record #[id number]  PCP:  Lin Landsman, MD  Cardiologist:  [place]  Chief Complaint  Patient presents with   Follow-up    History of Present Illness: Larry [name] is a 68 y.o. male who presents today for a follow up visit.  Seen for Dr. Rayann Heman.Primarily follows with me.   He has an ischemic CM with severe diffuse 3VD and prior stents to the RCA in 8250, systolic and diastolic dysfunction, HTN, HLD and obesity. Has had a history of long standing atrial tach that has been treated with beta blockerand CCB therapy. Was hospitalized back in April of 2013 with a HF exacerbation - had stopped his medicines. Last EF of 40 - to 03% with diastolic dysfunction as well noted in January of 2015.   Seen by me back in October of 2014 - Was out of his medicines again. Had lost weight and was anemic - I sent him to GI and he ended up having colon cancer.   He was admitted (02/02/2013 - 03/01/2013) to [facility] with subacute history of progressive weakness, constipation and difficulty eating and drinking. He presented with acute renal failure (Creatinine 7.83) and severe anemia (Hbg of 7.3) as well as obstruction. Colonoscopy doing this admission with biopsy revealed adenocarcinoma and patient underwent exploratory lap with partial colectomy with colostomy on 12/23 by Dr. Hulen Skains. Recovery was complicated by wound dehissance, requiring a return to the OR 12/29 with abdominal wound closure with VAC placment. After the procedure, the patient developed atrial tachycardia, so a dilt drip was started (12/29-1/2). On 1/4, the patient had a cardiac arrest, requiring defibrillation, and was subsequently transferred to the ICU. QT was found to be >700, with mild hypokalemia and hypomagnesemia implicated as the cause. Lidocaine drip was started. Electrolyte abnormality treated and  2D-echo revealed an EF of 40-45% with periapical akinesis and biatrial enlargement. He experienced recurrent torsades on 01/05 and shocked in NSR. By 1/6, QTc had decreased to 499, lidocaine drip was d/c'ed, and patient was transferred out of ICU to stepdown. Then admitted to physical medicine and rehabilitation on 01/09 due to severe deconditioning. He was continued on protein supplement to promote wound healing. His abdominal wound was treated with VAC through 03/05/13. His heart rate was controlled without recurrent arrythmia and was maintained on potassium and magnesium supplements. He was discharged to home on 03/07/2013. He was last evaluated by Dr. Hulen Skains on 02/03 with review of his pathology demonstrating invasive adenocarcinoma of the transverse colon with penetration into the pericolonic fat. He had 0/18 lymph nodes negative for metastatic disease. He was pathologic stage [id number].   Saw Dr. Gwenlyn Found in early July of 2015 for a foot ulcer - basically normal ABI'snoted at that time.  He was readmitted to the hospital in August of 2015 - triggered by too much salt. Presented with respiratory distress - treated with Bipap and quickly stabilized. Was diuresed about 6 liters as well. Started on low dose ACE. Sleep study recommended.  I have seen back here several times since that hospitalizationand have basically followed him since-he got referred for a sleep study and is on CPAP -in March of 2016 - wanting to get his colostomy reversed and needing pre op clearance. Had a Myoview to risk stratify - this was high risk and I referred him on for cardiac cath with Dr. Burt Knack. Underwent PCI to  the LAD with DES x 2. Committed to DAPT for one year ideally but left on indefinitely. He had to wait on colostomy reversal due to being on DAPT.  Seen at the cancer center back in [facility] 2017-HR elevated - asked to follow up here - I increased his Coreg further. He ended up going in to the hospital for  aweekend - still tachycardic - switched over to daily dosing of Verapamil and changed Coreg to Lopressor - looks like his ACE was stopped - pulmonary wanted him on ARB which was to be considered as an outpatient. Seen by pulmonary for a cough which led to a CT scan. HR in the 130's the whole time while in the ER. I then saw him back like 3 days later - he was feeling fine. Not short of breath. No more cough.   InNovember of 2017 - was having his tests done for planned reversal of his colostomy. Colostomy reversal occurred in February 2018 -thisdid not go well - had an anastomotic leakon POD 7 - ended up with an ileostomy and required a prolongedstay at [facility] respiratory failure. He was seen by cardiology forintermittentatrialtachycardia.He was able to be discharged from [facility].   I have seen him several times since. I have referred him back to Dr. Rayann Heman to review his medical therapy and to make sure we were all on track with the current plan of care. Has chronic and massive lower extremity edema - options are very limited and CCB will have to be continued.  He was admitted in May of 2019- had sepsis from a UTI. His medicines were cut back again. We have had several phone calls from Home health about his elevated HRafter discharge-got himback on his usual dose of CCB therapy and beta blocker.   He underwent GU proceduresin Juneof 2019 - despite being athigh risk for any procedures going forward from our standpoint.He did ok.I then saw him at the later part of June - he was doing ok - still no sensation of any palpitations or his heart rate. Remains pretty sedentary.Last visit was back in July - his weight continues to climb and he has gotten more sedentary.   The patient does not have symptoms concerning for COVID-19 infection (fever, chills, cough, or new shortness of breath).   Comes in today. Here alone. He says he is doing ok. He does not feel any heart racing or  palpitations. Says his breathing is ok. Denies chest pain. Staying in for the most part. Still going to the wound clinic. He has a dressing in place near his colostomy - he pulled it off and it ended up bleeding - he could not get it stopped - ended up with a prolonged ER stay - got cauterized and the bleeding stopped. He says his swelling is the same. His weight continues to climb.   Past Medical History:  Diagnosis Date   Arthritis    Atrial tachycardia ([place])    managed on beta blocker therapy   Childhood asthma    "went away after I was 16"   Chronic combined systolic and diastolic CHF, NYHA class 3 (HCC)    has diastolic heart failure grade 1; EF is 45 to 50% per echo 05/2011; EF 41% by Myoview 2016   CKD (chronic kidney disease) stage 3, GFR 30-59 ml/min    Colon cancer ([place]) 2015   MSI high; IHC loss of MLH1 and PMS2; BRAF negative; Negative methylation   COPD (chronic obstructive pulmonary disease) (  [place])    Coronary artery disease    Family history of ovarian cancer    Hypercholesteremia    Hypertension    Noncompliance    NSVT (nonsustained ventricular tachycardia) (HCC)    beta blocker restarted   Obesity    OSA on CPAP    used nightly, pt does not know settings   Peripheral arterial disease ([place])    4.2 cm thoracic aortic aneurysm per chest ct 11-14-15 epic   S/P colostomy ([place])    2014   Thrombocytopenia ([place])    Torsades de pointes ([place])    X 2 episodes during hospital visit 12'14"electrolyte imbalance"- "Shocked"    Past Surgical History:  Procedure Laterality Date   COLON SURGERY     COLONOSCOPY N/A 02/08/2013   Procedure: COLONOSCOPY;  Surgeon: [name], MD;  Location: [facility];  Service: Endoscopy;  Laterality: N/A;   COLONOSCOPY WITH PROPOFOL N/A 05/09/2014   Procedure: COLONOSCOPY WITH PROPOFOL;  Surgeon: [name], MD;  Location: [facility];  Service: Endoscopy;  Laterality: N/A;   COLONOSCOPY WITH PROPOFOL N/A 01/08/2016     Procedure: COLONOSCOPY WITH PROPOFOL;  Surgeon: [name], MD;  [facility]: [facility];  Service: Endoscopy;  Laterality: N/A;   COLONOSCOPY WITH PROPOFOL N/A 12/28/2018   Procedure: COLONOSCOPY WITH PROPOFOL;  Surgeon: [name], MD;  [facility];  Service: Endoscopy;  Laterality: N/A;   COLOSTOMY N/A 04/19/2016   Procedure: COLOSTOMY;  Surgeon: Judeth Horn, MD;  Location: [place];  Service: General;  Laterality: N/A;   COLOSTOMY REVERSAL  04/12/2016   COLOSTOMY REVERSAL N/A 04/12/2016   Procedure: COLOSTOMY REVERSAL;  Surgeon: Judeth Horn, MD;  Location: [place];  Service: General;  Laterality: N/A;   CORONARY ANGIOPLASTY WITH STENT PLACEMENT  11/12/2008; 06/11/2014   stent x 2 to RCA; stent x 2 to LAD   CORONARY ANGIOPLASTY WITH STENT PLACEMENT  06/11/2014   m-LAD 3.5 x 16 mm Synergy DES, d-LAD  2.25 x 16 mm Synergy DES   CYSTOSCOPY W/ URETERAL STENT PLACEMENT Left 06/30/2017   Procedure: CYSTOSCOPY WITH RETROGRADE PYELOGRAM/URETERAL STENT PLACEMENT;  Surgeon: Lucas Mallow, MD;  Location: [place];  Service: Urology;  Laterality: Left;   CYSTOSCOPY WITH LITHOLAPAXY N/A 08/02/2017   Procedure: CYSTOSCOPY WITH BLADDER STONE EXTRACTION;  Surgeon: Lucas Mallow, MD;  [facility];  Service: Urology;  Laterality: N/A;   CYSTOSCOPY WITH RETROGRADE PYELOGRAM, URETEROSCOPY AND STENT PLACEMENT Left 08/02/2017   Procedure: CYSTOSCOPY WITH LEFT RETROGRADE PYELOGRAM, URETEROSCOPY AND STENT EXCHANGE;  Surgeon: Lucas Mallow, MD;  [facility];  Service: Urology;  Laterality: Left;   ESOPHAGOGASTRODUODENOSCOPY N/A 02/08/2013   Procedure: ESOPHAGOGASTRODUODENOSCOPY (EGD);  Surgeon: [name], MD;  Location: [facility];  Service: Endoscopy;  Laterality: N/A;   FLEXIBLE SIGMOIDOSCOPY N/A 02/19/2016   Procedure: FLEXIBLE SIGMOIDOSCOPY;  Surgeon: [name], MD;  [facility];  Service: Endoscopy;  Laterality: N/A;   HOLMIUM LASER APPLICATION Left  3/41/9379   Procedure: HOLMIUM LASER APPLICATION;  Surgeon: Lucas Mallow, MD;  [facility];  Service: Urology;  Laterality: Left;   LAPAROTOMY N/A 02/12/2013   Procedure: EXPLORATORY LAPAROTOMY PARTIAL COLECTOMY WITH COLOSTOMY;  Surgeon: [name], MD;  Location: [place];  Service: General;  Laterality: N/A;   LAPAROTOMY N/A 02/18/2013   Procedure: EXPLORATORY LAPAROTOMY/Closure of Wound;  Surgeon: [name], MD;  Location: [place];  Service: General;  Laterality: N/A;   LAPAROTOMY N/A 04/19/2016   Procedure: EXPLORATORY LAPAROTOMY,  REPAIR OF ANASTAMOTIC LEAK;  Surgeon: Judeth Horn, MD;  Location: [place];  Service: General;  Laterality: N/A;   LEFT HEART CATHETERIZATION WITH CORONARY ANGIOGRAM N/A 06/11/2014   Procedure: LEFT HEART CATHETERIZATION WITH CORONARY ANGIOGRAM;  Surgeon: [name], MD; CFX calcified, 30-40 percent, RCA calcified, 40/50/40%, PDA diffuse disease, LAD 40/75/90% s/p DES 2    POLYPECTOMY  12/28/2018   Procedure: POLYPECTOMY;  Surgeon: [name], MD;  [facility];  Service: Endoscopy;;     Medications: Current Meds  Medication Sig   aspirin EC 81 MG tablet Take 1 tablet (81 mg total) by mouth daily.   atorvastatin (LIPITOR) 40 MG tablet TAKE 1 TABLET BY MOUTH EVERY DAY (Patient taking differently: Take 40 mg by mouth every evening. )   clopidogrel (PLAVIX) 75 MG tablet TAKE 1 TABLET (75 MG TOTAL) BY MOUTH DAILY WITH BREAKFAST. (Patient taking differently: Take 75 mg by mouth daily. )   diltiazem (CARDIZEM) 60 MG tablet TAKE 1.5 TABLETS (90 MG TOTAL) BY MOUTH 3 (THREE) TIMES DAILY.   diltiazem (CARDIZEM) 90 MG tablet Take 1 tablet (90 mg total) by mouth 3 (three) times daily.   ferrous sulfate 325 (65 FE) MG tablet Take 1 tablet (325 mg total) by mouth daily with breakfast.   furosemide (LASIX) 40 MG tablet TAKE 1.5 TABLETS (60 MG TOTAL) BY MOUTH DAILY.   levalbuterol (XOPENEX HFA) 45 MCG/ACT inhaler Inhale 2 puffs into the  lungs every 4 (four) hours as needed for wheezing.   metoprolol tartrate (LOPRESSOR) 25 MG tablet TAKE 1.5 TABLETS (37.5 MG TOTAL) BY MOUTH 2 (TWO) TIMES DAILY.   Multiple Vitamins-Minerals (CENTRUM SILVER 50+MEN PO) Take 1 tablet by mouth daily.   nitroGLYCERIN (NITROSTAT) 0.4 MG SL tablet Place 0.4 mg under the tongue every 5 (five) minutes as needed for chest pain.   simethicone (GAS-X EXTRA STRENGTH) 125 MG chewable tablet Chew 125 mg by mouth 3 (three) times daily.   spironolactone (ALDACTONE) 25 MG tablet Take 12.5 mg by mouth daily.   tamsulosin (FLOMAX) 0.4 MG CAPS capsule TAKE 1 CAPSULE BY MOUTH EVERY DAY (Patient taking differently: Take 0.4 mg by mouth daily. )     Allergies: No Known Allergies  Social History: The patient  reports that he quit smoking about 10 years ago. His smoking use included cigarettes. He has a 38.00 pack-year smoking history. He has never used smokeless tobacco. He reports current alcohol use. He reports that he does not use drugs.   Family History: The patient's family history includes Dementia in his mother; Diabetes in his father; Heart disease in his father; Hypertension in his father and mother; Leukemia in his maternal uncle; Liver cancer in his maternal grandmother; Parkinsonism in his mother; Prostate cancer in his maternal uncle.   Review of Systems: Please see the history of present illness.   All other systems are reviewed and negative.   Physical Exam: VS:  BP (!) 110/58 (BP Location: Left Arm, Patient Position: Sitting, Cuff Size: Large)    Pulse (!) 110    Ht 5' 11"  (1.803 m)    Wt (!) 351 lb 1.9 oz (159.3 kg)    BMI 48.97 kg/m  .  BMI Body mass index is 48.97 kg/m.  Wt Readings from Last 3 Encounters:  01/09/19 (!) 351 lb 1.9 oz (159.3 kg)  12/28/18 (!) 334 lb (151.5 kg)  10/28/18 (!) 338 lb 13.6 oz (153.7 kg)    General: Pleasant. Alert and in no acute distress. He remains morbidly  obese and his weight continues to climb.     HEENT: Normal.  Neck: Supple, no JVD, carotid bruits, or masses noted.  Cardiac: Heart tones are distant. He has massive bilateral edema that is unchanged.  Respiratory:  Lungs are relatively clear.  GI: Soft and nontender. Obese. He has a dressing in place - no bleeding that I see.  MS: No deformity or atrophy. Gait and ROM intact.  Skin: Warm and dry. Color is normal.  Neuro:  Strength and sensation are intact and no gross focal deficits noted.  Psych: Alert, appropriate and with normal affect.   LABORATORY DATA:  EKG:  EKG is ordered today. This demonstrates sinus tach - has PVCs noted today.  Lab Results  Component Value Date   WBC 11.3 (H) 11/02/2018   HGB 12.8 (L) 11/02/2018   HCT 40.2 11/02/2018   PLT 288 11/02/2018   GLUCOSE 95 11/02/2018   CHOL 164 09/11/2018   TRIG 147 09/11/2018   HDL 53 09/11/2018   LDLCALC 82 09/11/2018   ALT 27 09/11/2018   AST 23 09/11/2018   NA 138 11/02/2018   K 4.4 11/02/2018   CL 110 11/02/2018   CREATININE 2.10 (H) 11/02/2018   BUN 35 (H) 11/02/2018   CO2 19 (L) 11/02/2018   TSH 1.630 09/11/2018   INR 1.31 06/30/2017   HGBA1C 5.9 (H) 05/14/2016     BNP (last 3 results) No results for input(s): BNP in the last 8760 hours.  ProBNP (last 3 results) No results for input(s): PROBNP in the last 8760 hours.   Other Studies Reviewed Today:  EchoStudy Conclusions2/2018  - Left ventricle: The cavity size was normal. Wall thickness was increased in a pattern of moderate LVH. Systolic function was normal. The estimated ejection fraction was in the range of 55% to 60%. Wall motion was normal; there were no regional wall motion abnormalities. The study is not technically sufficient to allow evaluation of LV diastolic function. - Aortic root: The aortic root was mildly dilated. - Mitral valve: Calcified annulus.  Impressions:  - Technically difficult; definity used; normal LV function; moderate  LVH.   MyoviewStudy Highlights12/2017   Nuclear stress EF: 58%.  There was no ST segment deviation noted during stress.  There is a large defect of severe severity present in the basal inferior, mid inferior, apical septal, apical inferior and apex location. The defect is non-reversible. No ischemia noted. Tthis is consistent with diaphragmatic attenuation artifact vs. Scar.  This is a low risk study.  The left ventricular ejection fraction is normal (55-65%).     Procedure: Left Heart Cath, [facility] Coronary Angiography, PTCA and stenting of the mid and distal LAD April 2016.  Coronary angiography: Left mainstem: The left mainstem is patent. The vessel divides into the LAD and left circumflex. There is no significant stenosis, but there is mild irregularity.  Left anterior descending (LAD): The LAD is moderately calcified. The vessel is severely diseased. The proximal LAD is patent with 30-40% stenosis after the second septal perforator. There is diffuse calcification. The diagonal branches are small. The mid LAD has an eccentric 75% stenosis at the origin of the second diagonal branch. Beyond that area the mid and distal LAD are diffusely diseased. There is another severe stenosis in the apical LAD of 90%.  Left circumflex (LCx): The left circumflex is calcified. The vessel is patent with mild irregularity. There are no high-grade stenoses identified. There are scattered 30-40% stenoses throughout the proximal and mid circumflex as well  as the first OM branch.  Right coronary artery (RCA): The RCA is dominant. The vessel is heavily calcified. The stented segments in the mid and distal RCA are patent. The proximal vessel has 30-40% stenosis. The mid vessel has 30-40% stenosis. The distal vessel is patent with 50-60% stenosis involving the origin of the PDA. The PDA is diffusely diseased.  Left ventriculography: Deferred because of chronic kidney disease. By  nuclear scan the LVEF is 41%.  PCI Note: Following the diagnostic procedure, the decision was made to proceed with PCI of the mid and apical LAD. The right coronary artery had patent stents in the left circumflex had nonobstructive disease. The LAD is a diffusely diseased vessel and the mid and distal vessel would be a poor surgical targets. I felt that PCI was the best option in this patient with a high risk nuclear scan demonstrating anteroapical and anterolateral ischemia. He will require colostomy reversal, so I planned on treating him with Synergy drug-eluting stents which had a biodegradable polymer and potentially can allow for earlier interruption of dual antiplatelet therapy. The patient was loaded with Plavix 600 mg. Weight-based bivalirudin was given for anticoagulation. Once a therapeutic ACT was achieved, a 6 [place] XB LAD 3.5 cm guide catheter was inserted. A cougar coronary guidewire was used to cross the lesion in the apical LAD. The lesion was predilated with a 2.5 x 12 mm balloon. The same balloon was used to dilate the mid lesion. The apical lesion was then stented with a 2.25 x 16 mm Synergy DES. The stent was postdilated with a 2.5 mm noncompliant balloon. The mid lesion was then stented with a 3.5 x 16 mm Synergy DES. That stent was postdilated with a 3.75 mm noncompliant balloon. Following PCI, there was 0% residual stenosis and TIMI-3 flow. Final angiography confirmed an excellent result. The patient tolerated the procedure well. There were no immediate procedural complications. A TR band was used for radial hemostasis. The patient was transferred to the post catheterization recovery area for further monitoring.  PCI Data: Lesion 1: Vessel - LAD/Segment - distal/apical Percent Stenosis (pre) 90 TIMI-flow 3 Stent 2.25 x 16 mm Synergy DES Percent Stenosis (post) 0 TIMI-flow (post) 3  Lesion 2: Vessel - LAD/Segment - mid Percent Stenosis (pre) 75 TIMI-flow 3 Stent  3.5 x 16 mm Synergy DES Percent Stenosis (post) 0 TIMI-flow (post) 3  Estimated Blood Loss: Minimal  Final Conclusions:  1. Two-vessel coronary artery disease with continued patency of the stented segments in the right coronary artery and severe stenoses of the mid and apical LAD 2. Mild diffuse nonobstructive left circumflex stenosis 3. Successful PCI of the LAD using 2 drug-eluting stents   Recommendations:  Dual antiplatelet therapy for at least 6 months. Would be reasonable to consider interruption of aspirin and Plavix at 6 months for colostomy reversal.  [name] MD, [facility] 06/11/2014, 10:06 AM   Assessment / Plan:  1. Intermittent atrial tach - this is chronic for many years - never symptomatic - managed medically - this is unchanged. He has been back to see Dr. Rayann Heman to re-evaluate - he is not a good candidate for any EP procedures or AAD therapies - managed conservatively. No changes made today.   2. Known CAD with prior PCI to the RCA - s/p cath with PCI to the LAD in 2016 following high risk Myoview - has had DES x 2 to LAD in 2016 - remains on chronic DAPT therapy - last Myoview from 2017 was  low risk - he is managed medically and I would favor conservative management going forward. Fortunately, no active symptoms. CV risk factor modification is not a possibility unfortunately.   3. Combined systolic & diastolic HF - last echo with normal EF from 2018 - not really bothered by symptoms - pretty inactive. Always has massive edema - not much to offer. Seems unchanged.   4. Colon cancer - followed by Oncology - had attempt at colostomy reversal that failed - now with ileostomy - this will be long term. Continues to go to the wound clinic.   5. HTN - BP is ok - no changes made today.   6. CKD - lab today.   7. PVCs - not symptomatic.   8. HLD - on statin therapy-lab from earlier this year noted.   9.PriorGU procedures -he has been released from GU -  Ihaverefilled his Flomaxlast yearto avoid urinary retention with potential for recurrent UTI, then possible recurrent sepsis, etc. Will be available as needed. This was not discussed today.   10. COVID-19 Education: The signs and symptoms of COVID-19 were discussed with the patient and how to seek care for testing (follow up with PCP or arrange E-visit).  The importance of social distancing, staying at home, hand hygiene and wearing a mask when out in public were discussed today.  Current medicines are reviewed with the patient today.  The patient does not have concerns regarding medicines other than what has been noted above.  The following changes have been made:  See above.  Labs/ tests ordered today include:    Orders Placed This Encounter  Procedures   Basic metabolic panel   CBC no Diff   EKG 12-Lead     Disposition:   FU with [place] in 4 months.   Patient is agreeable to this plan and will call if any problems develop in the interim.   SignedTruitt Merle, NP  01/09/2019 11:26 AM  [place] [address] [place], [place]  [address] Phone: [phone] Fax: [phone]

## 2019-01-04 ENCOUNTER — Other Ambulatory Visit: Payer: Self-pay

## 2019-01-04 ENCOUNTER — Encounter (HOSPITAL_BASED_OUTPATIENT_CLINIC_OR_DEPARTMENT_OTHER): Payer: Medicare Other | Attending: Internal Medicine | Admitting: Internal Medicine

## 2019-01-04 DIAGNOSIS — I130 Hypertensive heart and chronic kidney disease with heart failure and stage 1 through stage 4 chronic kidney disease, or unspecified chronic kidney disease: Secondary | ICD-10-CM | POA: Insufficient documentation

## 2019-01-04 DIAGNOSIS — I129 Hypertensive chronic kidney disease with stage 1 through stage 4 chronic kidney disease, or unspecified chronic kidney disease: Secondary | ICD-10-CM | POA: Insufficient documentation

## 2019-01-04 DIAGNOSIS — G4733 Obstructive sleep apnea (adult) (pediatric): Secondary | ICD-10-CM | POA: Insufficient documentation

## 2019-01-04 DIAGNOSIS — Y838 Other surgical procedures as the cause of abnormal reaction of the patient, or of later complication, without mention of misadventure at the time of the procedure: Secondary | ICD-10-CM | POA: Insufficient documentation

## 2019-01-04 DIAGNOSIS — J449 Chronic obstructive pulmonary disease, unspecified: Secondary | ICD-10-CM | POA: Insufficient documentation

## 2019-01-04 DIAGNOSIS — Z85038 Personal history of other malignant neoplasm of large intestine: Secondary | ICD-10-CM | POA: Insufficient documentation

## 2019-01-04 DIAGNOSIS — Z932 Ileostomy status: Secondary | ICD-10-CM | POA: Diagnosis not present

## 2019-01-04 DIAGNOSIS — I2510 Atherosclerotic heart disease of native coronary artery without angina pectoris: Secondary | ICD-10-CM | POA: Insufficient documentation

## 2019-01-04 DIAGNOSIS — N183 Chronic kidney disease, stage 3 unspecified: Secondary | ICD-10-CM | POA: Diagnosis not present

## 2019-01-04 DIAGNOSIS — Z6841 Body Mass Index (BMI) 40.0 and over, adult: Secondary | ICD-10-CM | POA: Diagnosis not present

## 2019-01-04 DIAGNOSIS — T8131XA Disruption of external operation (surgical) wound, not elsewhere classified, initial encounter: Secondary | ICD-10-CM | POA: Insufficient documentation

## 2019-01-04 DIAGNOSIS — I5022 Chronic systolic (congestive) heart failure: Secondary | ICD-10-CM | POA: Insufficient documentation

## 2019-01-04 DIAGNOSIS — Z87891 Personal history of nicotine dependence: Secondary | ICD-10-CM | POA: Diagnosis not present

## 2019-01-07 ENCOUNTER — Emergency Department (HOSPITAL_COMMUNITY)
Admission: EM | Admit: 2019-01-07 | Discharge: 2019-01-08 | Disposition: A | Discharge status: Home / Self Care | Payer: Medicare Other | Attending: Emergency Medicine | Admitting: Emergency Medicine

## 2019-01-07 ENCOUNTER — Other Ambulatory Visit: Payer: Self-pay

## 2019-01-07 ENCOUNTER — Encounter (HOSPITAL_COMMUNITY): Payer: Self-pay | Admitting: Emergency Medicine

## 2019-01-07 DIAGNOSIS — Z85038 Personal history of other malignant neoplasm of large intestine: Secondary | ICD-10-CM | POA: Diagnosis not present

## 2019-01-07 DIAGNOSIS — Z7901 Long term (current) use of anticoagulants: Secondary | ICD-10-CM | POA: Diagnosis not present

## 2019-01-07 DIAGNOSIS — I5042 Chronic combined systolic (congestive) and diastolic (congestive) heart failure: Secondary | ICD-10-CM | POA: Diagnosis not present

## 2019-01-07 DIAGNOSIS — N183 Chronic kidney disease, stage 3 unspecified: Secondary | ICD-10-CM | POA: Diagnosis not present

## 2019-01-07 DIAGNOSIS — Z933 Colostomy status: Secondary | ICD-10-CM | POA: Diagnosis not present

## 2019-01-07 DIAGNOSIS — I2510 Atherosclerotic heart disease of native coronary artery without angina pectoris: Secondary | ICD-10-CM | POA: Insufficient documentation

## 2019-01-07 DIAGNOSIS — Z87891 Personal history of nicotine dependence: Secondary | ICD-10-CM | POA: Insufficient documentation

## 2019-01-07 DIAGNOSIS — Z7982 Long term (current) use of aspirin: Secondary | ICD-10-CM | POA: Diagnosis not present

## 2019-01-07 DIAGNOSIS — J449 Chronic obstructive pulmonary disease, unspecified: Secondary | ICD-10-CM | POA: Insufficient documentation

## 2019-01-07 DIAGNOSIS — L7622 Postprocedural hemorrhage and hematoma of skin and subcutaneous tissue following other procedure: Secondary | ICD-10-CM | POA: Insufficient documentation

## 2019-01-07 DIAGNOSIS — I130 Hypertensive heart and chronic kidney disease with heart failure and stage 1 through stage 4 chronic kidney disease, or unspecified chronic kidney disease: Secondary | ICD-10-CM | POA: Insufficient documentation

## 2019-01-07 DIAGNOSIS — Z4801 Encounter for change or removal of surgical wound dressing: Secondary | ICD-10-CM | POA: Diagnosis not present

## 2019-01-07 DIAGNOSIS — Z79899 Other long term (current) drug therapy: Secondary | ICD-10-CM | POA: Diagnosis not present

## 2019-01-07 DIAGNOSIS — Z5189 Encounter for other specified aftercare: Secondary | ICD-10-CM

## 2019-01-07 DIAGNOSIS — K91840 Postprocedural hemorrhage and hematoma of a digestive system organ or structure following a digestive system procedure: Secondary | ICD-10-CM | POA: Diagnosis not present

## 2019-01-07 NOTE — ED Triage Notes (Signed)
Patient with wound from ostomy.  Patient states that he was bleeding from the site earlier.  He is not having pain, but wants it looked at because the abdominal belt had a lot of blood on it.

## 2019-01-08 DIAGNOSIS — L7622 Postprocedural hemorrhage and hematoma of skin and subcutaneous tissue following other procedure: Secondary | ICD-10-CM | POA: Diagnosis not present

## 2019-01-08 MED ORDER — LIDOCAINE-EPINEPHRINE (PF) 2 %-1:200000 IJ SOLN
10.0000 mL | Freq: Once | INTRAMUSCULAR | Status: AC
  Administered 2019-01-08: 10 mL
  Filled 2019-01-08: qty 20

## 2019-01-08 MED ORDER — SILVER NITRATE-POT NITRATE 75-25 % EX MISC
1.0000 | Freq: Once | CUTANEOUS | Status: AC
  Administered 2019-01-08: 10:00:00 1 via TOPICAL
  Filled 2019-01-08: qty 1

## 2019-01-08 NOTE — ED Provider Notes (Signed)
[facility] EMERGENCY DEPARTMENT Provider Note   CSN: [id number] Arrival date & time: 01/07/19  1936     History   Chief Complaint Chief Complaint  Patient presents with  . Wound Check    HPI Elmore L Cradle is a 68 y.o. male with a past medical history of CHF, CKD, obesity, s/p colostomy with failed revision, who present to ED for bleeding at incision. States abdominal incision is from ileostomy about 1-1.5 years ago. He was told by wound care nurse that he should wear an abdominal belt.  He believes he tied the belt too tight last night and saw a large amount of blood on the belt when he tried to take it off.  He presents to the ED today due to this bleeding.  He denies any pain at the site, other trauma to the area.  He currently takes Plavix daily.     HPI  Past Medical History:  Diagnosis Date  . Arthritis   . Atrial tachycardia ([employer])    managed on beta blocker therapy  . Childhood asthma    "went away after I was 16"  . Chronic combined systolic and diastolic CHF, NYHA class 3 (HCC)    has diastolic heart failure grade 1; EF is 45 to 50% per echo 05/2011; EF 41% by Myoview 2016  . CKD (chronic kidney disease) stage 3, GFR 30-59 ml/min   . Colon cancer ([place]) 2015   MSI high; IHC loss of MLH1 and PMS2; BRAF negative; Negative methylation  . COPD (chronic obstructive pulmonary disease) ([place])   . Coronary artery disease   . Family history of ovarian cancer   . Hypercholesteremia   . Hypertension   . Noncompliance   . NSVT (nonsustained ventricular tachycardia) (HCC)    beta blocker restarted  . Obesity   . OSA on CPAP    used nightly, pt does not know settings  . Peripheral arterial disease (HCC)    4.2 cm thoracic aortic aneurysm per chest ct 11-14-15 epic  . S/P colostomy ([place])    2014  . Thrombocytopenia ([place])   . Torsades de pointes ([place])    X 2 episodes during hospital visit 12'14"electrolyte imbalance"- "Shocked"    Patient Active  Problem List   Diagnosis Date Noted  . Tonsillitis 10/28/2018  . Pharyngitis 10/27/2018  . Poor dentition 10/27/2018  . Sepsis due to urinary tract infection ([place]) 06/30/2017  . Acute kidney injury superimposed on chronic kidney disease III ([place]) 06/30/2017  . Coronary artery disease 06/30/2017  . Hypertension 06/30/2017  . OSA on CPAP 06/30/2017  . COPD (chronic obstructive pulmonary disease) ([place]) 06/30/2017  . Venous stasis 06/30/2017  . Ectopic atrial tachycardia ([place]) 06/30/2017  . History of colostomy reversal 06/30/2017  . Hydronephrosis of left kidney with hydroureter 06/30/2017  . Sepsis secondary to UTI ([place]) 06/30/2017  . Genetic testing 08/08/2016  . Acute pulmonary edema (HCC)   . Acute respiratory failure with hypoxia ([place])   . SBO (small bowel obstruction) ([place])   . Encounter for nasogastric tube placement   . Abdominal pain   . Hypervolemia   . Pressure injury of skin 04/25/2016  . Acute respiratory failure ([place])   . Adynamic ileus ([place])   . Peritonitis ([place])   . Colostomy in place [facility]) 04/12/2016  . Pulmonary infiltrate present on computed tomography 11/15/2015  . Cough 11/14/2015  . Lung nodule   . Atrial tachycardia ([place])   . Chronic diastolic CHF (congestive heart failure) (  HCC)   . Palpitations   . Dyspnea and respiratory abnormalities   . Family history of ovarian cancer   . CKD (chronic kidney disease), stage III ([place]) 06/12/2014  . Thrombocytopenia ([place])   . Ischemic chest pain ([place]) 06/11/2014  . Atherosclerosis of artery of extremity with ulceration ([place]) 06/06/2014  . Venous stasis ulcer of left lower extremity ([place]) 06/06/2014  . Acute respiratory distress 09/30/2013  . Respiratory distress 09/30/2013  . Peripheral arterial disease ([place]) 08/21/2013  . Postop check 03/26/2013  . Physical deconditioning 03/01/2013  . Torsades de pointes ([place]) 02/27/2013  . Acute on chronic diastolic heart failure ([place]) 02/15/2013  . Colon cancer ([place])  02/09/2013  . Atrial fibrillation ([place]) 02/05/2013  . Acute renal failure ([place]) 02/02/2013  . Hypotension 02/02/2013  . Chronic blood loss anemia 02/02/2013  . Edema 06/16/2011  . Tachycardia 06/16/2011  . IMPOTENCE OF ORGANIC ORIGIN 01/12/2009  . Obstructive sleep apnea 12/04/2008  . HYPERLIPIDEMIA 12/03/2008  . Obesity 12/03/2008  . Coronary atherosclerosis 12/03/2008  . CARDIOMYOPATHY, ISCHEMIC 12/03/2008  . Atrial tachycardia, paroxysmal ([place]) 12/03/2008  . ASTHMA 12/03/2008  . C O P D 12/03/2008  . Essential hypertension 12/02/2008    Past Surgical History:  Procedure Laterality Date  . COLON SURGERY    . COLONOSCOPY N/A 02/08/2013   Procedure: COLONOSCOPY;  Surgeon: [name], MD;  Location: [facility];  Service: Endoscopy;  Laterality: N/A;  . COLONOSCOPY WITH PROPOFOL N/A 05/09/2014   Procedure: COLONOSCOPY WITH PROPOFOL;  Surgeon: [name], MD;  Location: [facility];  Service: Endoscopy;  Laterality: N/A;  . COLONOSCOPY WITH PROPOFOL N/A 01/08/2016   Procedure: COLONOSCOPY WITH PROPOFOL;  Surgeon: [name], MD;  [facility];  Service: Endoscopy;  Laterality: N/A;  . COLONOSCOPY WITH PROPOFOL N/A 12/28/2018   Procedure: COLONOSCOPY WITH PROPOFOL;  Surgeon: [name], MD;  [facility];  Service: Endoscopy;  Laterality: N/A;  . COLOSTOMY N/A 04/19/2016   Procedure: COLOSTOMY;  Surgeon: Judeth Horn, MD;  Location: [facility];  Service: General;  Laterality: N/A;  . COLOSTOMY REVERSAL  04/12/2016  . COLOSTOMY REVERSAL N/A 04/12/2016   Procedure: COLOSTOMY REVERSAL;  Surgeon: Judeth Horn, MD;  Location: [facility];  Service: General;  Laterality: N/A;  . CORONARY ANGIOPLASTY WITH STENT PLACEMENT  11/12/2008; 06/11/2014   stent x 2 to RCA; stent x 2 to LAD  . CORONARY ANGIOPLASTY WITH STENT PLACEMENT  06/11/2014   m-LAD 3.5 x 16 mm Synergy DES, d-LAD  2.25 x 16 mm Synergy DES  . CYSTOSCOPY W/ URETERAL STENT PLACEMENT Left 06/30/2017   Procedure:  CYSTOSCOPY WITH RETROGRADE PYELOGRAM/URETERAL STENT PLACEMENT;  Surgeon: Lucas Mallow, MD;  Location: [facility];  Service: Urology;  Laterality: Left;  . CYSTOSCOPY WITH LITHOLAPAXY N/A 08/02/2017   Procedure: CYSTOSCOPY WITH BLADDER STONE EXTRACTION;  Surgeon: Lucas Mallow, MD;  [facility];  Service: Urology;  Laterality: N/A;  . CYSTOSCOPY WITH RETROGRADE PYELOGRAM, URETEROSCOPY AND STENT PLACEMENT Left 08/02/2017   Procedure: CYSTOSCOPY WITH LEFT RETROGRADE PYELOGRAM, URETEROSCOPY AND STENT EXCHANGE;  Surgeon: Lucas Mallow, MD;  [facility];  Service: Urology;  Laterality: Left;  . ESOPHAGOGASTRODUODENOSCOPY N/A 02/08/2013   Procedure: ESOPHAGOGASTRODUODENOSCOPY (EGD);  Surgeon: [name], MD;  Location: [facility];  Service: Endoscopy;  Laterality: N/A;  . FLEXIBLE SIGMOIDOSCOPY N/A 02/19/2016   Procedure: FLEXIBLE SIGMOIDOSCOPY;  Surgeon: [name], MD;  [facility];  Service: Endoscopy;  Laterality: N/A;  . HOLMIUM LASER APPLICATION Left 0/32/1224  Procedure: HOLMIUM LASER APPLICATION;  Surgeon: Lucas Mallow, MD;  [facility];  Service: Urology;  Laterality: Left;  . LAPAROTOMY N/A 02/12/2013   Procedure: EXPLORATORY LAPAROTOMY PARTIAL COLECTOMY WITH COLOSTOMY;  Surgeon: [name], MD;  Location: [facility];  Service: General;  Laterality: N/A;  . LAPAROTOMY N/A 02/18/2013   Procedure: EXPLORATORY LAPAROTOMY/Closure of Wound;  Surgeon: [name], MD;  Location: [facility];  Service: General;  Laterality: N/A;  . LAPAROTOMY N/A 04/19/2016   Procedure: EXPLORATORY LAPAROTOMY, REPAIR OF ANASTAMOTIC LEAK;  Surgeon: Judeth Horn, MD;  Location: [facility];  Service: General;  Laterality: N/A;  . LEFT HEART CATHETERIZATION WITH CORONARY ANGIOGRAM N/A 06/11/2014   Procedure: LEFT HEART CATHETERIZATION WITH CORONARY ANGIOGRAM;  Surgeon: [name], MD; CFX calcified, 30-40 percent, RCA calcified, 40/50/40%, PDA diffuse disease, LAD 40/75/90% s/p DES  2   . POLYPECTOMY  12/28/2018   Procedure: POLYPECTOMY;  Surgeon: [name], MD;  [facility];  Service: Endoscopy;;        Home Medications    Prior to Admission medications   Medication Sig Start Date End Date Taking? Authorizing Provider  aspirin EC 81 MG tablet Take 1 tablet (81 mg total) by mouth daily. 03/06/13   Love, Ivan Anchors, PA-C  atorvastatin (LIPITOR) 40 MG tablet TAKE 1 TABLET BY MOUTH EVERY DAY Patient taking differently: Take 40 mg by mouth every evening.  12/12/18   Burtis Junes, NP  clopidogrel (PLAVIX) 75 MG tablet TAKE 1 TABLET (75 MG TOTAL) BY MOUTH DAILY WITH BREAKFAST. Patient taking differently: Take 75 mg by mouth daily.  12/12/18   Burtis Junes, NP  diltiazem (CARDIZEM) 60 MG tablet TAKE 1.5 TABLETS (90 MG TOTAL) BY MOUTH 3 (THREE) TIMES DAILY. 01/01/19   Burtis Junes, NP  diltiazem (CARDIZEM) 90 MG tablet Take 1 tablet (90 mg total) by mouth 3 (three) times daily. 01/26/18   Burtis Junes, NP  ferrous sulfate 325 (65 FE) MG tablet Take 1 tablet (325 mg total) by mouth daily with breakfast. 01/15/14   Burtis Junes, NP  furosemide (LASIX) 40 MG tablet TAKE 1.5 TABLETS (60 MG TOTAL) BY MOUTH DAILY. 01/04/19   Burtis Junes, NP  levalbuterol [facility] HFA) 45 MCG/ACT inhaler Inhale 2 puffs into the lungs every 4 (four) hours as needed for wheezing.    [provider]  metoprolol tartrate (LOPRESSOR) 25 MG tablet TAKE 1.5 TABLETS (37.5 MG TOTAL) BY MOUTH 2 (TWO) TIMES DAILY. 12/03/18   Burtis Junes, NP  Multiple Vitamins-Minerals (CENTRUM SILVER 50+MEN PO) Take 1 tablet by mouth daily.    [provider]  nitroGLYCERIN (NITROSTAT) 0.4 MG SL tablet Place 0.4 mg under the tongue every 5 (five) minutes as needed for chest pain.    [provider]  simethicone (GAS-X EXTRA STRENGTH) 125 MG chewable tablet Chew 125 mg by mouth 3 (three) times daily.    [provider]  spironolactone (ALDACTONE) 25 MG  tablet Take 12.5 mg by mouth daily. 11/13/18   [provider]  tamsulosin (FLOMAX) 0.4 MG CAPS capsule TAKE 1 CAPSULE BY MOUTH EVERY DAY Patient taking differently: Take 0.4 mg by mouth daily.  11/01/18   Burtis Junes, NP    Family History Family History  Problem Relation Age of Onset  . Hypertension Father   . Heart disease Father        before age 37  . Diabetes Father   . Hypertension Mother   .  Dementia Mother   . Parkinsonism Mother   . Liver cancer Maternal Grandmother        dx in her 21s  . Prostate cancer Maternal Uncle   . Leukemia Maternal Uncle     Social History Social History   Tobacco Use  . Smoking status: Former Smoker    Packs/day: 1.00    Years: 38.00    Pack years: 38.00    Types: Cigarettes    Quit date: 07/15/2008    Years since quitting: 10.4  . Smokeless tobacco: Never Used  Substance Use Topics  . Alcohol use: Yes    Alcohol/week: 0.0 standard drinks    Comment: none snice colostomy placed  . Drug use: No     Allergies   Patient has no known allergies.   Review of Systems Review of Systems  Constitutional: Negative for chills and fever.  Gastrointestinal: Negative for abdominal pain.  Skin: Positive for wound.     Physical Exam Updated Vital Signs BP (!) 142/84 (BP Location: Right Arm)   Pulse 80   Temp 97.7 F (36.5 C) (Oral)   Resp 16   SpO2 99%   Physical Exam Vitals signs and nursing note reviewed.  Constitutional:      General: He is not in acute distress.    Appearance: He is well-developed. He is not diaphoretic.  HENT:     Head: Normocephalic and atraumatic.  Eyes:     General: No scleral icterus.    Conjunctiva/sclera: Conjunctivae normal.  Neck:     Musculoskeletal: Normal range of motion.  Pulmonary:     Effort: Pulmonary effort is normal. No respiratory distress.  Abdominal:     Comments: Colostomy noted on right abdomen.  There is a large surgical incision at the midline of the lower abdomen  with a punctuate area of bleeding superiorly.  Skin:    Findings: No rash.  Neurological:     Mental Status: He is alert.      ED Treatments / Results  Labs (all labs ordered are listed, but only abnormal results are displayed) Labs Reviewed - No data to display  EKG None  Radiology No results found.  Procedures Procedures (including critical care time)  Medications Ordered in ED Medications  silver nitrate applicators applicator 1 Stick (1 Stick Topical Given 01/08/19 1012)  lidocaine-EPINEPHrine (XYLOCAINE W/EPI) 2 %-1:200000 (PF) injection 10 mL (10 mLs Infiltration Given 01/08/19 1012)     Initial Impression / Assessment and Plan / ED Course  I have reviewed the triage vital signs and the nursing notes.  Pertinent labs & imaging results that were available during my care of the patient were reviewed by me and considered in my medical decision making (see chart for details).       68 year old male currently on Plavix presenting to the ED with wound.  He had an ileostomy done a year and a half ago and was told to wear an abdominal binder.  He put this on yesterday and believes he put on too tight and an area at his incision site started bleeding.  On exam there is a punctuate area of what appears to be oozing venous blood.  Patient vital signs within normal limits, he denies any lightheadedness or shortness of breath.  This area was injected with lidocaine with epi and cauterized with silver nitrate to achieve hemostasis.  Quick clot gauze use for dressing.  Patient agreeable to follow-up with wound care.  Patient is hemodynamically stable, in NAD,  and able to ambulate in the ED. Evaluation does not show pathology that would require ongoing emergent intervention or inpatient treatment. I explained the diagnosis to the patient. Pain has been managed and has no complaints prior to discharge. Patient is comfortable with above plan and is stable for discharge at this time. All  questions were answered prior to disposition. Strict return precautions for returning to the ED were discussed. Encouraged follow up with PCP.   An After Visit Summary was printed and given to the patient.   Portions of this note were generated with [employer]. Dictation errors may occur despite best attempts at proofreading.   Final Clinical Impressions(s) / ED Diagnoses   Final diagnoses:  Visit for wound check    ED Discharge Orders    None       Delia Heady, PA-C 01/08/19 1059    Wyvonnia Dusky, MD 01/09/19 [phone]

## 2019-01-08 NOTE — Discharge Instructions (Addendum)
Follow-up with your primary care provider. Return to the ED if you start to have worsening symptoms, lightheadedness, increased bleeding or other concerns.

## 2019-01-09 ENCOUNTER — Ambulatory Visit (INDEPENDENT_AMBULATORY_CARE_PROVIDER_SITE_OTHER): Payer: Medicare Other | Admitting: Nurse Practitioner

## 2019-01-09 ENCOUNTER — Encounter: Payer: Self-pay | Admitting: Nurse Practitioner

## 2019-01-09 ENCOUNTER — Other Ambulatory Visit: Payer: Self-pay

## 2019-01-09 VITALS — BP 110/58 | HR 110 | Ht 71.0 in | Wt 351.1 lb

## 2019-01-09 DIAGNOSIS — Z7189 Other specified counseling: Secondary | ICD-10-CM | POA: Diagnosis not present

## 2019-01-09 DIAGNOSIS — I5022 Chronic systolic (congestive) heart failure: Secondary | ICD-10-CM

## 2019-01-09 DIAGNOSIS — I471 Supraventricular tachycardia: Secondary | ICD-10-CM

## 2019-01-09 DIAGNOSIS — E7800 Pure hypercholesterolemia, unspecified: Secondary | ICD-10-CM

## 2019-01-09 DIAGNOSIS — I259 Chronic ischemic heart disease, unspecified: Secondary | ICD-10-CM

## 2019-01-09 NOTE — Patient Instructions (Addendum)
After Visit Summary:  We will be checking the following labs today - BMET & CBC   Medication Instructions:    Continue with your current medicines.    If you need a refill on your cardiac medications before your next appointment, please call your pharmacy.     Testing/Procedures To Be Arranged:  N/A  Follow-Up:   See me in 4 months    At [facility], you and your health needs are our priority.  As part of our continuing mission to provide you with exceptional heart care, we have created designated Provider Care Teams.  These Care Teams include your primary Cardiologist (physician) and Advanced Practice Providers (APPs -  Physician Assistants and Nurse Practitioners) who all work together to provide you with the care you need, when you need it.  Special Instructions:  . Stay safe, stay home, wash your hands for at least 20 seconds and wear a mask when out in public.  . It was good to talk with you today.    Call the [facility] office at [phone] if you have any questions, problems or concerns.

## 2019-01-10 ENCOUNTER — Encounter (HOSPITAL_BASED_OUTPATIENT_CLINIC_OR_DEPARTMENT_OTHER): Payer: Medicare Other | Admitting: Internal Medicine

## 2019-01-10 DIAGNOSIS — G4733 Obstructive sleep apnea (adult) (pediatric): Secondary | ICD-10-CM | POA: Diagnosis not present

## 2019-01-10 DIAGNOSIS — T8131XA Disruption of external operation (surgical) wound, not elsewhere classified, initial encounter: Secondary | ICD-10-CM | POA: Diagnosis not present

## 2019-01-10 DIAGNOSIS — N183 Chronic kidney disease, stage 3 unspecified: Secondary | ICD-10-CM | POA: Diagnosis not present

## 2019-01-10 DIAGNOSIS — I129 Hypertensive chronic kidney disease with stage 1 through stage 4 chronic kidney disease, or unspecified chronic kidney disease: Secondary | ICD-10-CM | POA: Diagnosis not present

## 2019-01-10 DIAGNOSIS — J449 Chronic obstructive pulmonary disease, unspecified: Secondary | ICD-10-CM | POA: Diagnosis not present

## 2019-01-10 DIAGNOSIS — T8189XA Other complications of procedures, not elsewhere classified, initial encounter: Secondary | ICD-10-CM | POA: Diagnosis not present

## 2019-01-10 DIAGNOSIS — I2510 Atherosclerotic heart disease of native coronary artery without angina pectoris: Secondary | ICD-10-CM | POA: Diagnosis not present

## 2019-01-10 LAB — BASIC METABOLIC PANEL
BUN/Creatinine Ratio: 10 (ref 10–24)
BUN: 24 mg/dL (ref 8–27)
CO2: 19 mmol/L — ABNORMAL LOW (ref 20–29)
Calcium: 9.9 mg/dL (ref 8.6–10.2)
Chloride: 105 mmol/L (ref 96–106)
Creatinine, Ser: 2.29 mg/dL — ABNORMAL HIGH (ref 0.76–1.27)
GFR calc Af Amer: 33 mL/min/{1.73_m2} — ABNORMAL LOW (ref >59)
GFR calc non Af Amer: 29 mL/min/{1.73_m2} — ABNORMAL LOW (ref >59)
Glucose: 93 mg/dL (ref 65–99)
Potassium: 3.9 mmol/L (ref 3.5–5.2)
Sodium: 140 mmol/L (ref 134–144)

## 2019-01-10 LAB — CBC
Hematocrit: 37.1 % — ABNORMAL LOW (ref 37.5–51.0)
Hemoglobin: 12.4 g/dL — ABNORMAL LOW (ref 13.0–17.7)
MCH: 29.7 pg (ref 26.6–33.0)
MCHC: 33.4 g/dL (ref 31.5–35.7)
MCV: 89 fL (ref 79–97)
Platelets: 158 10*3/uL (ref 150–450)
RBC: 4.17 x10E6/uL (ref 4.14–5.80)
RDW: 14.1 % (ref 11.6–15.4)
WBC: 6.1 10*3/uL (ref 3.4–10.8)

## 2019-01-10 NOTE — Progress Notes (Signed)
Warman, Ashe L. ([id number]) Visit Report for 01/10/2019 HPI Details Patient Name: Date of Service: Larry [name], Larry [name] 01/10/2019 3:45 PM Medical Record [id number] Patient Account Number: [id number] Date of Birth/Sex: Treating RN: Jul 09, 1950 (68 y.o. Larry [name] Primary Care Provider: Lin [name] Other Clinician: Referring Provider: Treating Provider/Extender:Larry [name], Larry [name], Larry [name] in Treatment: 0 History of Present Illness Location: Patient presents with a wound to left lower leg. Quality: Patient reports No Pain. Severity: Patient states wound(s) are getting worse. Duration: Patient has had the wound for < 2 [name] prior to presenting for treatment Modifying Factors: pvd HPI Description: pt smoked until 5 years ago. cancer surgery 1 year ago. has ruq stoma. cv studies last year revealed non-compressible vessels. recent pe by pcp yielded some heart abnormality and he is scheduled for stress test. 5/24 2 new wounds on rt leg. left side healed 08/12/14; several small wounds on the right lateral leg and one wound on the left leg which is new. The edema control here does not look adequate. There is also some degree of maceration around the wounds on the right lateral leg READMISSION 09/14/2018 This is a patient we had in this clinic in 2013 and then again in 2016 with wounds on his right lateral leg secondary to chronic venous insufficiency. He was cared for I believe in 2016 by Dr. Lindon Romp. The patient is here for review of 6 small wounds within a large surgical area on his abdomen. The patient had surgery for a malignant neoplasm of the sigmoid colon with a colostomy. I cannot see the date of the exact surgery in epic. In February 2018 he had an attempted colostomy reversal however he had a microperforation I believe at the anastomosis. He required a repeat laparotomy. He had an ileostomy raised at that time. I believe the surgical wound at that time was  left to close via secondary intention. The patient is unaware whether he had a wound VAC on this. In any case this almost is completely closed except he has been left with 6 small eraser head sized areas on the abdomen that have not healed. I am not exactly sure how he was how he is been dressing this. He has been followed with frequent visits in general surgery dating back at least over a year. Past medical history includes chronic systolic heart failure, stage III chronic renal failure. Malignant neoplasm of the sigmoid colon, morbid obesity. 7/31; not a lot of change 6 small wounds within a large surgical area on his abdomen. None of these are hyper granulated as opposed to last week. None of them have exposed mesh or surrounding infection. He has been using [facility] 8/14- He comes after 2 [name], he has only 2 open wounds that I can tell this time, he has been using Hydrofera Blue 8/28; 2-week follow-up. Very superficial areas on the abdominal surgical site scar. He is using Hydrofera Blue and this seems to be doing a good job. He has not made it down to the medical supply store to see if he can get an abdominal binder or something to keep the tension off this wound area. READMISSION 01/04/2019 This is a 68 year old man we had in the clinic from July through August 2020. He had wounds in the midline abdominal scar. [Please see history from my note of 09/14/2018}. When he was here last time we used Hydrofera Blue. Things seem to be improving. He did not discharge in a healed state his last visit here was on  8/28. He states he continued to use the [facility] and at 1 point this was very close to healing but more recently it is reopened. The wounds are basically in the same state as last time superficial friable easily bleeding wounds. Some hyper granulation. He has an abdominal binder that he got at [facility] however he says it is too tight for him to get on. Past medical history  is reviewed; he has coronary artery disease, stage III chronic renal failure heart failure with reduced ejection fraction, COPD, hypertension, obstructive sleep apnea. He was hospitalized since he was here last time for a severe pharyngitis. 11/19; the patient was in the ER on 11/16; this was for oozing from his abdominal wound. He was cauterized with quick clot gauze and epi and silver nitrate. This was felt to be venous oozing. He has very friable wounds in this area. He is using his abdominal binder. Electronic Signature(s) Signed: 01/10/2019 6:25:25 PM By: Larry Ham [name] Entered By: Larry [name] on 01/10/2019 17:19:51 -------------------------------------------------------------------------------- Chemical Cauterization Details Patient Name: Date of Service: Larry [name] 01/10/2019 3:45 PM Medical Record [id number] Patient Account Number: [id number] Date of Birth/Sex: Treating RN: 11-Jun-1950 (68 y.o. Larry [name] Primary Care Provider: Lin [name] Other Clinician: Referring Provider: Treating Provider/Extender:Larry [name], Larry [name], Larry [name] in Treatment: 0 Procedure Performed for: Wound #8 Abdomen - midline Performed By: Physician Larry [name] Post Procedure Diagnosis Same as Pre-procedure Notes silver nitrate used. Electronic Signature(s) Signed: 01/10/2019 6:25:25 PM By: Larry Ham [name] Entered By: Larry [name] on 01/10/2019 17:18:45 -------------------------------------------------------------------------------- Physical Exam Details Patient Name: Date of Service: Larry [name] 01/10/2019 3:45 PM Medical Record [id number] Patient Account Number: [id number] Date of Birth/Sex: Treating RN: Jun 21, 1950 (68 y.o. Larry [name] Primary Care Provider: Other Clinician: Lin [name] Referring Provider: Treating Provider/Extender:Larry [name], Larry [name], Larry [name] in Treatment: 0 Constitutional Sitting or  standing Blood Pressure is within target range for patient.. Pulse regular and within target range for patient.[name] Kitchen Respirations regular, non-labored and within target range.. Temperature is normal and within the target range for the patient.[name] Kitchen Appears in no distress. Notes Wound exam; the area in question is in the midline part of his abdomen. This is a surgical scar from a laparotomy. It is next to his ileostomy. There is no current bleeding but even wiping this area with gauze to clean it causes oozing. We have been Hydrofera Blue in this area which I have found useful in the past. We have also given using silver nitrate on the oozing areas. I think we are making some gradual progress in some of these open areas although he still has open areas in the middle. Smaller areas superiorly and inferiorly appear to have satisfactory surfaces on them. Electronic Signature(s) Signed: 01/10/2019 6:25:25 PM By: Larry Ham [name] Entered By: Larry [name] on 01/10/2019 17:21:45 -------------------------------------------------------------------------------- Physician Orders Details Patient Name: Date of Service: TEMILOLUWA, SPURLOCK 01/10/2019 3:45 PM Medical Record [id number] Patient Account Number: [id number] Date of Birth/Sex: Treating RN: 1950-12-31 (68 y.o. Larry [name] Primary Care Provider: Lin [name] Other Clinician: Referring Provider: Treating Provider/Extender:Loveta Dellis, Larry [name], Larry [name] in Treatment: 0 Verbal / Phone Orders: No Diagnosis Coding ICD-10 Coding Code Description Disruption of external operation (surgical) wound, not elsewhere classified, subsequent T81.31XD encounter Unspecified open wound of abdominal wall, periumbilic region without penetration into peritoneal S31.105S cavity, sequela Follow-up Appointments Return Appointment in 2 [name]. Dressing Change Frequency Change Dressing every other day. Wound Cleansing May shower and wash wound  with  soap and water. - with dressing change Primary Wound Dressing Hydrofera Blue - apply hydrogel under hydrofera blue. Secondary Dressing Dry Gauze ABD pad Other: - abdominal binder while up during the day. may remove at night. Electronic Signature(s) Signed: 01/10/2019 6:25:25 PM By: Larry Ham [name] Signed: 01/10/2019 6:38:14 PM By: Deon Pilling Entered By: Deon Pilling on 01/10/2019 17:27:31 -------------------------------------------------------------------------------- Problem List Details Patient Name: Date of Service: JAYMIN, GURIAN 01/10/2019 3:45 PM Medical Record [id number] Patient Account Number: [id number] Date of Birth/Sex: Treating RN: June 05, 1950 (68 y.o. Larry [name] Primary Care Provider: Lin [name] Other Clinician: Referring Provider: Treating Provider/Extender:Zayvon Alicea, Larry [name], Larry [name] in Treatment: 0 Active Problems ICD-10 Evaluated Encounter Code Description Active Date Today Diagnosis T81.31XD Disruption of external operation (surgical) wound, not 01/04/2019 No Yes elsewhere classified, subsequent encounter S31.105S Unspecified open wound of abdominal wall, [id number] No Yes periumbilic region without penetration into peritoneal cavity, sequela Inactive Problems Resolved Problems Electronic Signature(s) Signed: 01/10/2019 6:25:25 PM By: Larry Ham [name] Entered By: Larry [name] on 01/10/2019 17:18:31 -------------------------------------------------------------------------------- Progress Note Details Patient Name: Date of Service: Larry [name] 01/10/2019 3:45 PM Medical Record [id number] Patient Account Number: [id number] Date of Birth/Sex: Treating RN: 1950/12/24 (68 y.o. Larry [name] Primary Care Provider: Lin [name] Other Clinician: Referring Provider: Treating Provider/Extender:Mishel Sans, Larry [name], Larry [name] in Treatment: 0 Subjective History of Present Illness (HPI) The  following HPI elements were documented for the patient's wound: Location: Patient presents with a wound to left lower leg. Quality: Patient reports No Pain. Severity: Patient states wound(s) are getting worse. Duration: Patient has had the wound for < 2 [name] prior to presenting for treatment Modifying Factors: pvd pt smoked until 5 years ago. cancer surgery 1 year ago. has ruq stoma. cv studies last year revealed non- compressible vessels. recent pe by pcp yielded some heart abnormality and he is scheduled for stress test. 5/24 2 new wounds on rt leg. left side healed 08/12/14; several small wounds on the right lateral leg and one wound on the left leg which is new. The edema control here does not look adequate. There is also some degree of maceration around the wounds on the right lateral leg READMISSION 09/14/2018 This is a patient we had in this clinic in 2013 and then again in 2016 with wounds on his right lateral leg secondary to chronic venous insufficiency. He was cared for I believe in 2016 by Dr. Lindon Romp. The patient is here for review of 6 small wounds within a large surgical area on his abdomen. The patient had surgery for a malignant neoplasm of the sigmoid colon with a colostomy. I cannot see the date of the exact surgery in epic. In February 2018 he had an attempted colostomy reversal however he had a microperforation I believe at the anastomosis. He required a repeat laparotomy. He had an ileostomy raised at that time. I believe the surgical wound at that time was left to close via secondary intention. The patient is unaware whether he had a wound VAC on this. In any case this almost is completely closed except he has been left with 6 small eraser head sized areas on the abdomen that have not healed. I am not exactly sure how he was how he is been dressing this. He has been followed with frequent visits in general surgery dating back at least over a year. Past medical history  includes chronic systolic heart failure, stage III chronic renal failure. Malignant neoplasm of the sigmoid colon, morbid obesity. 7/31; not  a lot of change 6 small wounds within a large surgical area on his abdomen. None of these are hyper granulated as opposed to last week. None of them have exposed mesh or surrounding infection. He has been using [facility] 8/14- He comes after 2 [name], he has only 2 open wounds that I can tell this time, he has been using Hydrofera Blue 8/28; 2-week follow-up. Very superficial areas on the abdominal surgical site scar. He is using Hydrofera Blue and this seems to be doing a good job. He has not made it down to the medical supply store to see if he can get an abdominal binder or something to keep the tension off this wound area. READMISSION 01/04/2019 This is a 68 year old man we had in the clinic from July through August 2020. He had wounds in the midline abdominal scar. [Please see history from my note of 09/14/2018}. When he was here last time we used Hydrofera Blue. Things seem to be improving. He did not discharge in a healed state his last visit here was on 8/28. He states he continued to use the [facility] and at 1 point this was very close to healing but more recently it is reopened. The wounds are basically in the same state as last time superficial friable easily bleeding wounds. Some hyper granulation. He has an abdominal binder that he got at [facility] however he says it is too tight for him to get on. Past medical history is reviewed; he has coronary artery disease, stage III chronic renal failure heart failure with reduced ejection fraction, COPD, hypertension, obstructive sleep apnea. He was hospitalized since he was here last time for a severe pharyngitis. 11/19; the patient was in the ER on 11/16; this was for oozing from his abdominal wound. He was cauterized with quick clot gauze and epi and silver nitrate. This was felt to be  venous oozing. He has very friable wounds in this area. He is using his abdominal binder. Objective Constitutional Sitting or standing Blood Pressure is within target range for patient.. Pulse regular and within target range for patient.[name] Kitchen Respirations regular, non-labored and within target range.. Temperature is normal and within the target range for the patient.[name] Kitchen Appears in no distress. Vitals Time Taken: 4:28 PM, Height: 71 in, Weight: 344 lbs, BMI: 48, Temperature: 97.5 F, Pulse: 112 bpm, Respiratory Rate: 18 breaths/min, Blood Pressure: 110/83 mmHg. General Notes: Wound exam; the area in question is in the midline part of his abdomen. This is a surgical scar from a laparotomy. It is next to his ileostomy. There is no current bleeding but even wiping this area with gauze to clean it causes oozing. We have been Hydrofera Blue in this area which I have found useful in the past. We have also given using silver nitrate on the oozing areas. I think we are making some gradual progress in some of these open areas although he still has open areas in the middle. Smaller areas superiorly and inferiorly appear to have satisfactory surfaces on them. Integumentary (Hair, Skin) Wound #8 status is Open. Original cause of wound was Surgical Injury. The wound is located on the Abdomen - midline. The wound measures 10.5cm length x 3cm width x 0.1cm depth; 24.74cm^2 area and 2.474cm^3 volume. There is Fat Layer (Subcutaneous Tissue) Exposed exposed. There is no tunneling or undermining noted. There is a medium amount of serosanguineous drainage noted. The wound margin is flat and intact. There is large (67-100%) red, friable granulation within the  wound bed. There is a small (1-33%) amount of necrotic tissue within the wound bed including Adherent Slough. Assessment Active Problems ICD-10 Disruption of external operation (surgical) wound, not elsewhere classified, subsequent encounter Unspecified open  wound of abdominal wall, periumbilic region without penetration into peritoneal cavity, sequela Procedures Wound #8 Pre-procedure diagnosis of Wound #8 is an Open Surgical Wound located on the Abdomen - midline . An Chemical Cauterization procedure was performed by Larry [name]. Post procedure Diagnosis Wound #8: Same as Pre-Procedure Notes: silver nitrate used. Plan Follow-up Appointments: Return Appointment in 2 [name]. Dressing Change Frequency: Change Dressing every other day. Wound Cleansing: May shower and wash wound with soap and water. - with dressing change Primary Wound Dressing: Hydrofera Blue - apply hydrogel under hydrofera blue. Secondary Dressing: Dry Gauze ABD pad Other: - abdominal binder while up during the day. may remove at night. 1. Hydrofera Blue after cauterization 2. I have asked him to continue to use his abdominal binder I have emphasized he does not need to wear this at night. If he is settling down at 8:00 to watch for ballgame remove he can take it off then. This is largely for when he is up. I think we are making some progress. This type of wound is never easy to heal. You are in the middle of scar tissue Electronic Signature(s) Signed: 01/10/2019 6:25:25 PM By: Larry Ham [name] Signed: 01/10/2019 6:38:14 PM By: Deon Pilling Entered By: Deon Pilling on 01/10/2019 17:27:48 -------------------------------------------------------------------------------- SuperBill Details Patient Name: Date of Service: Larry [name] 01/10/2019 Medical Record [id number] Patient Account Number: [id number] Date of Birth/Sex: Treating RN: 11-26-1950 (68 y.o. Larry [name] Primary Care Provider: Lin [name] Other Clinician: Referring Provider: Treating Provider/Extender:Martyna Thorns, Larry [name], Larry [name] in Treatment: 0 Diagnosis Coding ICD-10 Codes Code Description Disruption of external operation (surgical) wound, not elsewhere  classified, subsequent T81.31XD encounter Unspecified open wound of abdominal wall, periumbilic region without penetration into peritoneal S31.105S cavity, sequela Facility Procedures CPT4: Description Modifier Quantity Code [id number] - CHEM CAUT GRANULATION TISS 1 ICD-10 Diagnosis Description S31.105S Unspecified open wound of abdominal wall, periumbilic region without penetration into peritoneal cavity, sequela T81.31XD  Disruption of external operation (surgical) wound, not elsewhere classified, subsequent encounter Physician Procedures CPT4: Description Modifier Quantity Code [id number] - WC PHYS CHEM CAUT GRAN TISSUE 1 ICD-10 Diagnosis Description [id number] Unspecified open wound of abdominal wall, periumbilic region without penetration into peritoneal cavity, sequela T81.31XD  Disruption of external operation (surgical) wound, not elsewhere classified, subsequent encounter Electronic Signature(s) Signed: 01/10/2019 6:25:25 PM By: Larry Ham [name] Entered By: Larry [name] on 01/10/2019 17:25:32

## 2019-01-15 NOTE — Progress Notes (Signed)
Gavidia, Maddex L. ([id number]) Visit Report for 01/10/2019 Arrival Information Details Patient Name: Date of Service: TERRIS, PRIGGE 01/10/2019 3:45 PM Medical Record [id number] Patient Account Number: [id number] Date of Birth/Sex: Treating RN: July 27, 1950 (68 y.o. [name] Primary Care Jenniffer Vessels: Lin Landsman Other Clinician: Referring Ahmaad Neidhardt: Treating Alexandar Weisenberger/Extender:Robson, Fernanda Drum, [name] in Treatment: 0 Visit Information History Since Last Visit Added or deleted any medications: No Patient Arrived: Ambulatory Any new allergies or adverse reactions: No Arrival Time: 16:27 Had a fall or experienced change in No Accompanied By: alone activities of daily living that may affect Transfer Assistance: None risk of falls: Patient Identification Verified: Yes Signs or symptoms of abuse/neglect since last No Secondary Verification Process Completed: Yes visito Patient Requires Transmission-Based No Hospitalized since last visit: No Precautions: Implantable device outside of the clinic excluding No Patient Has Alerts: No cellular tissue based products placed in the center since last visit: Has Dressing in Place as Prescribed: Yes Pain Present Now: No Electronic Signature(s) Signed: 01/14/2019 2:18:43 PM By: Levan Hurst RN, BSN Entered By: Levan Hurst on 01/10/2019 16:27:53 -------------------------------------------------------------------------------- Encounter Discharge Information Details Patient Name: Date of Service: [name] 01/10/2019 3:45 PM Medical Record [id number] Patient Account Number: [id number] Date of Birth/Sex: Treating RN: [birth year] (68 y.o. [name] Primary Care Oluwaseyi Raffel: Lin Landsman Other Clinician: Referring Aziah Brostrom: Treating Beatris Belen/Extender:Robson, Fernanda Drum, [name] in Treatment: 0 Encounter Discharge Information Items Discharge Condition: Stable Ambulatory  Status: Ambulatory Discharge Destination: Home Transportation: Private Auto Accompanied By: self Schedule Follow-up Appointment: Yes Clinical Summary of Care: Electronic Signature(s) Signed: 01/10/2019 6:38:14 PM By: Deon Pilling Entered By: Deon Pilling on 01/10/2019 17:29:00 -------------------------------------------------------------------------------- Multi Wound Chart Details Patient Name: Date of Service: [name] 01/10/2019 3:45 PM Medical Record [id number] Patient Account Number: [id number] Date of Birth/Sex: Treating RN: 1950-11-15 (68 y.o. [name] Primary Care Rosamary Boudreau: Lin Landsman Other Clinician: Referring Taelar Gronewold: Treating Phinehas Grounds/Extender:Robson, Fernanda Drum, [name] in Treatment: 0 Vital Signs Height(in): 71 Pulse(bpm): 112 Weight(lbs): 344 Blood Pressure(mmHg): 110/83 Body Mass Index(BMI): 48 Temperature(F): 97.5 Respiratory 18 Rate(breaths/min): Photos: [8:No Photos] [N/A:N/A] Wound Location: [8:Abdomen - midline] [N/A:N/A] Wounding Event: [8:Surgical Injury] [N/A:N/A] Primary Etiology: [8:Open Surgical Wound] [N/A:N/A] Comorbid History: [8:Asthma, Chronic Obstructive Pulmonary Disease (COPD), Sleep Apnea, Arrhythmia, Congestive Heart Failure, Coronary Artery Disease, Hypertension, Peripheral Arterial Disease, Peripheral Venous Disease] [N/A:N/A] Date Acquired: [8:02/21/2017] [N/A:N/A] Weeks of Treatment: [8:0] [N/A:N/A] Wound Status: [8:Open] [N/A:N/A] Measurements L x W x D 10.5x3x0.1 [N/A:N/A] (cm) Area (cm) : [8:24.74] [N/A:N/A] Volume (cm) : [8:2.474] [N/A:N/A] % Reduction in Area: [8:69.00%] [N/A:N/A] % Reduction in Volume: 69.00% [N/A:N/A] Classification: [8:Full Thickness Without Exposed Support Structures] [N/A:N/A] Exudate Amount: [8:Medium] [N/A:N/A] Exudate Type: [8:Serosanguineous] [N/A:N/A] Exudate Color: [8:red, brown] [N/A:N/A] Wound Margin: [8:Flat and Intact] [N/A:N/A] Granulation Amount:  [8:Large (67-100%)] [N/A:N/A] Granulation Quality: [8:Red, Friable] [N/A:N/A] Necrotic Amount: [8:Small (1-33%)] [N/A:N/A] Exposed Structures: [8:Fat Layer (Subcutaneous Tissue) Exposed: Yes Fascia: No Tendon: No Muscle: No Joint: No Bone: No] [N/A:N/A] Epithelialization: [8:Small (1-33%) Chemical Cauterization] [N/A:N/A N/A] Treatment Notes Electronic Signature(s) Signed: 01/10/2019 6:25:25 PM By: Linton Ham MD Signed: 01/10/2019 6:38:14 PM By: Deon Pilling Entered By: Linton Ham on 01/10/2019 17:18:36 -------------------------------------------------------------------------------- Multi-Disciplinary Care Plan Details Patient Name: Date of Service: [name] 01/10/2019 3:45 PM Medical Record [id number] Patient Account Number: [id number] Date of Birth/Sex: Treating RN: [birth year] (68 y.o. [name] Primary Care Tannie Koskela: Lin Landsman Other Clinician: Referring Rafay Dahan: Treating Makaylee Spielberg/Extender:Robson, Fernanda Drum, [name] in Treatment: 0 Active Inactive Wound/Skin Impairment Nursing Diagnoses: Impaired tissue  integrity Goals: Ulcer/skin breakdown will have a volume reduction of 30% by week 4 Date Initiated: 01/04/2019 Target Resolution Date: 02/01/2019 Goal Status: Active Interventions: Provide education on ulcer and skin care Notes: Electronic Signature(s) Signed: 01/10/2019 6:38:14 PM By: Deon Pilling Entered By: Deon Pilling on 01/10/2019 17:13:48 -------------------------------------------------------------------------------- Pain Assessment Details Patient Name: Date of Service: BRENDT, OCON 01/10/2019 3:45 PM Medical Record [id number] Patient Account Number: [id number] Date of Birth/Sex: Treating RN: 1950/07/10 (68 y.o. [name] Primary Care Dolphus Linch: Lin Landsman Other Clinician: Referring Amand Lemoine: Treating Lua Feng/Extender:Robson, Fernanda Drum, [name] in Treatment: 0 Active  Problems Location of Pain Severity and Description of Pain Patient Has Paino No Site Locations Pain Management and Medication Current Pain Management: Electronic Signature(s) Signed: 01/14/2019 2:18:43 PM By: Levan Hurst RN, BSN Entered By: Levan Hurst on 01/10/2019 16:28:39 -------------------------------------------------------------------------------- Patient/Caregiver Education Details Ignacia Marvel 11/19/2020andnbsp3:45 Patient Name: Date of Service: L. PM Medical Record Patient Account Number: [id number] Number: Treating RN: Deon Pilling Date of Birth/Gender: [birth year] (67 y.o. M) Other Clinician: Primary Care Physician: Lin Landsman Treating Linton Ham Referring Physician: Physician/Extender: Katharine Look in Treatment: 0 Education Assessment Education Provided To: Patient Education Topics Provided Wound/Skin Impairment: Handouts: Skin Care Do's and Dont's Methods: Explain/Verbal Responses: Reinforcements needed Electronic Signature(s) Signed: 01/10/2019 6:38:14 PM By: Deon Pilling Entered By: Deon Pilling on 01/10/2019 17:14:01 -------------------------------------------------------------------------------- Wound Assessment Details Patient Name: Date of Service: JOLYON, FISCHBECK 01/10/2019 3:45 PM Medical Record [id number] Patient Account Number: [id number] Date of Birth/Sex: Treating RN: Jun 01, 1950 (68 y.o. [name] Primary Care Charlina Dwight: Lin Landsman Other Clinician: Referring Keishia Ground: Treating Hadia Minier/Extender:Robson, Fernanda Drum, [name] in Treatment: 0 Wound Status Wound Number: [id number] Primary Open Surgical Wound Etiology: Wound Location: Abdomen - midline Wound Open Wounding Event: Surgical Injury Status: Date Acquired: 02/21/2017 Comorbid Asthma, Chronic Obstructive Pulmonary Weeks Of Treatment: 0 History: Disease (COPD), Sleep Apnea, Arrhythmia, Clustered Wound: No Congestive Heart  Failure, Coronary Artery Disease, Hypertension, Peripheral Arterial Disease, Peripheral Venous Disease Photos Wound Measurements Length: (cm) 10.5 % Reductio Width: (cm) 3 % Reductio Depth: (cm) 0.1 Epitheli Area: (cm) 24.74 Tunneli Volume: (cm) 2.474 Undermi Wound Description Classification: Full Thickness Without Exposed Support Foul Odo Structures Slough/F Wound Flat and Intact Margin: Exudate Medium Amount: Exudate Serosanguineous Type: Exudate red, brown Color: Wound Bed Granulation Amount: Large (67-100%) Granulation Quality: Red, Friable Fascia E Necrotic Amount: Small (1-33%) Fat Laye Necrotic Quality: Adherent Slough Tendon E Muscle E Joint Ex Bone Exp r After Cleansing: No ibrino Yes Exposed Structure xposed: No r (Subcutaneous Tissue) Exposed: Yes xposed: No xposed: No posed: No osed: No n in Area: 69% n in Volume: 69% alization: Small (1-33%) ng: No ning: No Treatment Notes Wound #8 (Abdomen - midline) 1. Cleanse With Wound Cleanser 2. Periwound Care Skin Prep 3. Primary Dressing Applied Hydrogel or K-Y Jelly Hydrofera Blue 4. Secondary Dressing ABD Pad 5. Secured With [employer] [facility] [employer]) Signed: 01/14/2019 2:18:43 PM By: Levan Hurst RN, BSN Signed: 01/15/2019 7:37:04 AM By: Mikeal Hawthorne EMT/HBOT Entered By: Mikeal Hawthorne on 01/14/2019 13:47:02 -------------------------------------------------------------------------------- Vitals Details Patient Name: Date of Service: ALVEY, CHUKWU 01/10/2019 3:45 PM Medical Record [id number] Patient Account Number: [id number] Date of Birth/Sex: Treating RN: 1950/09/30 (68 y.o. [name] Primary Care Shadman Tozzi: Lin Landsman Other Clinician: Referring [place] Silveria: Treating Audra Kagel/Extender:Robson, Fernanda Drum, [name] in Treatment: 0 Vital Signs Time Taken: 16:28 Temperature (F): 97.5 Height (in): 71 Pulse (bpm): 112 Weight (lbs):  344 Respiratory Rate (breaths/min): 18 Body Mass Index (BMI): 48  Blood Pressure (mmHg): 110/83 Reference Range: 80 - 120 mg / dl Electronic Signature(s) Signed: 01/14/2019 2:18:43 PM By: Levan Hurst RN, BSN Entered By: Levan Hurst on 01/10/2019 16:31:04

## 2019-01-19 ENCOUNTER — Emergency Department (HOSPITAL_COMMUNITY)
Admission: EM | Admit: 2019-01-19 | Discharge: 2019-01-19 | Disposition: A | Discharge status: Home / Self Care | Payer: Medicare Other | Attending: Emergency Medicine | Admitting: Emergency Medicine

## 2019-01-19 DIAGNOSIS — Z87891 Personal history of nicotine dependence: Secondary | ICD-10-CM | POA: Diagnosis not present

## 2019-01-19 DIAGNOSIS — T148XXA Other injury of unspecified body region, initial encounter: Secondary | ICD-10-CM

## 2019-01-19 DIAGNOSIS — I2510 Atherosclerotic heart disease of native coronary artery without angina pectoris: Secondary | ICD-10-CM | POA: Insufficient documentation

## 2019-01-19 DIAGNOSIS — K91841 Postprocedural hemorrhage and hematoma of a digestive system organ or structure following other procedure: Secondary | ICD-10-CM | POA: Diagnosis not present

## 2019-01-19 DIAGNOSIS — Z7982 Long term (current) use of aspirin: Secondary | ICD-10-CM | POA: Diagnosis not present

## 2019-01-19 DIAGNOSIS — J449 Chronic obstructive pulmonary disease, unspecified: Secondary | ICD-10-CM | POA: Insufficient documentation

## 2019-01-19 DIAGNOSIS — I129 Hypertensive chronic kidney disease with stage 1 through stage 4 chronic kidney disease, or unspecified chronic kidney disease: Secondary | ICD-10-CM | POA: Insufficient documentation

## 2019-01-19 DIAGNOSIS — K9409 Other complications of colostomy: Secondary | ICD-10-CM | POA: Insufficient documentation

## 2019-01-19 DIAGNOSIS — N183 Chronic kidney disease, stage 3 unspecified: Secondary | ICD-10-CM | POA: Insufficient documentation

## 2019-01-19 DIAGNOSIS — Z79899 Other long term (current) drug therapy: Secondary | ICD-10-CM | POA: Diagnosis not present

## 2019-01-19 LAB — CBC
HCT: 39.8 % (ref 39.0–52.0)
Hemoglobin: 12.2 g/dL — ABNORMAL LOW (ref 13.0–17.0)
MCH: 29.4 pg (ref 26.0–34.0)
MCHC: 30.7 g/dL (ref 30.0–36.0)
MCV: 95.9 fL (ref 80.0–100.0)
Platelets: 138 10*3/uL — ABNORMAL LOW (ref 150–400)
RBC: 4.15 MIL/uL — ABNORMAL LOW (ref 4.22–5.81)
RDW: 14.8 % (ref 11.5–15.5)
WBC: 5.4 10*3/uL (ref 4.0–10.5)
nRBC: 0 % (ref 0.0–0.2)

## 2019-01-19 LAB — BASIC METABOLIC PANEL
Anion gap: 13 (ref 5–15)
BUN: 26 mg/dL — ABNORMAL HIGH (ref 8–23)
CO2: 22 mmol/L (ref 22–32)
Calcium: 9.5 mg/dL (ref 8.9–10.3)
Chloride: 107 mmol/L (ref 98–111)
Creatinine, Ser: 2.16 mg/dL — ABNORMAL HIGH (ref 0.61–1.24)
GFR calc Af Amer: 36 mL/min — ABNORMAL LOW (ref >60)
GFR calc non Af Amer: 31 mL/min — ABNORMAL LOW (ref >60)
Glucose, Bld: 94 mg/dL (ref 70–99)
Potassium: 4 mmol/L (ref 3.5–5.1)
Sodium: 142 mmol/L (ref 135–145)

## 2019-01-19 MED ORDER — LIDOCAINE-EPINEPHRINE (PF) 2 %-1:200000 IJ SOLN
20.0000 mL | Freq: Once | INTRAMUSCULAR | Status: AC
  Administered 2019-01-19: 20 mL via INTRADERMAL

## 2019-01-19 MED ORDER — LIDOCAINE-EPINEPHRINE (PF) 2 %-1:200000 IJ SOLN
INTRAMUSCULAR | Status: AC
  Filled 2019-01-19: qty 20

## 2019-01-19 NOTE — ED Triage Notes (Signed)
Pt came in POV c/o of bleeding from wound near ostomy site. Bleeding is coming the reversal site. Bleeding comes and goes.

## 2019-01-19 NOTE — Discharge Instructions (Signed)
He can try and apply direct pressure to the area should it start to bleed.  If this persist after 15 minutes of direct pressure then please return to the emergency department for evaluation.  Follow-up with your wound care clinic, at that time they can take the suture out.

## 2019-01-19 NOTE — ED Notes (Signed)
Patient verbalizes understanding of discharge instructions . Opportunity for questions and answers were provided . Armband removed by staff ,Pt discharged from ED. W/C  offered at D/C  and Declined W/C at D/C and was escorted to lobby by RN.  

## 2019-01-19 NOTE — ED Provider Notes (Signed)
[facility] EMERGENCY DEPARTMENT Provider Note   CSN: [id number] Arrival date & time: 01/19/19  2549     History   Chief Complaint Chief Complaint  Patient presents with  . Bleeding Wound    HPI Larry [name] is a 68 y.o. male.     68 yo M with a chief complaints of bleeding from an old surgical site.  Patient has had bleeding from this off and on for the past week.  Was seen in the ED previously and had it cauterized.  Has followed up in the wound care clinic since then.  Has had a small area of the incision that has had trouble healing.  Has finally come together.  He denies any other change from his chronic medical problems.  The history is provided by the patient.  Illness Severity:  Moderate Onset quality:  Gradual Duration:  2 days Timing:  Constant Progression:  Unchanged Chronicity:  New Associated symptoms: no abdominal pain, no chest pain, no congestion, no diarrhea, no fever, no headaches, no myalgias, no rash, no shortness of breath and no vomiting     Past Medical History:  Diagnosis Date  . Arthritis   . Atrial tachycardia ([place])    managed on beta blocker therapy  . Childhood asthma    "went away after I was 16"  . Chronic combined systolic and diastolic CHF, NYHA class 3 (HCC)    has diastolic heart failure grade 1; EF is 45 to 50% per echo 05/2011; EF 41% by Myoview 2016  . CKD (chronic kidney disease) stage 3, GFR 30-59 ml/min   . Colon cancer ([place]) 2015   MSI high; IHC loss of MLH1 and PMS2; BRAF negative; Negative methylation  . COPD (chronic obstructive pulmonary disease) ([place])   . Coronary artery disease   . Family history of ovarian cancer   . Hypercholesteremia   . Hypertension   . Noncompliance   . NSVT (nonsustained ventricular tachycardia) (HCC)    beta blocker restarted  . Obesity   . OSA on CPAP    used nightly, pt does not know settings  . Peripheral arterial disease (HCC)    4.2 cm thoracic aortic aneurysm  per chest ct 11-14-15 epic  . S/P colostomy ([place])    2014  . Thrombocytopenia ([place])   . Torsades de pointes ([place])    X 2 episodes during hospital visit 12'14"electrolyte imbalance"- "Shocked"    Patient Active Problem List   Diagnosis Date Noted  . Tonsillitis 10/28/2018  . Pharyngitis 10/27/2018  . Poor dentition 10/27/2018  . Sepsis due to urinary tract infection ([place]) 06/30/2017  . Acute kidney injury superimposed on chronic kidney disease III ([place]) 06/30/2017  . Coronary artery disease 06/30/2017  . Hypertension 06/30/2017  . OSA on CPAP 06/30/2017  . COPD (chronic obstructive pulmonary disease) ([place]) 06/30/2017  . Venous stasis 06/30/2017  . Ectopic atrial tachycardia ([place]) 06/30/2017  . History of colostomy reversal 06/30/2017  . Hydronephrosis of left kidney with hydroureter 06/30/2017  . Sepsis secondary to UTI ([place]) 06/30/2017  . Genetic testing 08/08/2016  . Acute pulmonary edema (HCC)   . Acute respiratory failure with hypoxia ([place])   . SBO (small bowel obstruction) ([place])   . Encounter for nasogastric tube placement   . Abdominal pain   . Hypervolemia   . Pressure injury of skin 04/25/2016  . Acute respiratory failure ([place])   . Adynamic ileus ([place])   . Peritonitis ([place])   . Colostomy in  place ([place]) 04/12/2016  . Pulmonary infiltrate present on computed tomography 11/15/2015  . Cough 11/14/2015  . Lung nodule   . Atrial tachycardia ([place])   . Chronic diastolic CHF (congestive heart failure) ([place])   . Palpitations   . Dyspnea and respiratory abnormalities   . Family history of ovarian cancer   . CKD (chronic kidney disease), stage III ([place]) 06/12/2014  . Thrombocytopenia ([place])   . Ischemic chest pain ([place]) 06/11/2014  . Atherosclerosis of artery of extremity with ulceration ([place]) 06/06/2014  . Venous stasis ulcer of left lower extremity ([place]) 06/06/2014  . Acute respiratory distress 09/30/2013  . Respiratory distress 09/30/2013  . Peripheral arterial disease  ([place]) 08/21/2013  . Postop check 03/26/2013  . Physical deconditioning 03/01/2013  . Torsades de pointes ([place]) 02/27/2013  . Acute on chronic diastolic heart failure ([place]) 02/15/2013  . Colon cancer ([place]) 02/09/2013  . Atrial fibrillation ([place]) 02/05/2013  . Acute renal failure ([place]) 02/02/2013  . Hypotension 02/02/2013  . Chronic blood loss anemia 02/02/2013  . Edema 06/16/2011  . Tachycardia 06/16/2011  . IMPOTENCE OF ORGANIC ORIGIN 01/12/2009  . Obstructive sleep apnea 12/04/2008  . HYPERLIPIDEMIA 12/03/2008  . Obesity 12/03/2008  . Coronary atherosclerosis 12/03/2008  . CARDIOMYOPATHY, ISCHEMIC 12/03/2008  . Atrial tachycardia, paroxysmal ([place]) 12/03/2008  . ASTHMA 12/03/2008  . C O P D 12/03/2008  . Essential hypertension 12/02/2008    Past Surgical History:  Procedure Laterality Date  . COLON SURGERY    . COLONOSCOPY N/A 02/08/2013   Procedure: COLONOSCOPY;  Surgeon: [name], MD;  Location: [facility];  Service: Endoscopy;  Laterality: N/A;  . COLONOSCOPY WITH PROPOFOL N/A 05/09/2014   Procedure: COLONOSCOPY WITH PROPOFOL;  Surgeon: [name], MD;  Location: [facility];  Service: Endoscopy;  Laterality: N/A;  . COLONOSCOPY WITH PROPOFOL N/A 01/08/2016   Procedure: COLONOSCOPY WITH PROPOFOL;  Surgeon: [name], MD;  [facility];  Service: Endoscopy;  Laterality: N/A;  . COLONOSCOPY WITH PROPOFOL N/A 12/28/2018   Procedure: COLONOSCOPY WITH PROPOFOL;  Surgeon: [name], MD;  [facility];  Service: Endoscopy;  Laterality: N/A;  . COLOSTOMY N/A 04/19/2016   Procedure: COLOSTOMY;  Surgeon: Judeth Horn, MD;  Location: [facility];  Service: General;  Laterality: N/A;  . COLOSTOMY REVERSAL  04/12/2016  . COLOSTOMY REVERSAL N/A 04/12/2016   Procedure: COLOSTOMY REVERSAL;  Surgeon: Judeth Horn, MD;  Location: [facility];  Service: General;  Laterality: N/A;  . CORONARY ANGIOPLASTY WITH STENT PLACEMENT  11/12/2008; 06/11/2014   stent x 2 to RCA; stent  x 2 to LAD  . CORONARY ANGIOPLASTY WITH STENT PLACEMENT  06/11/2014   m-LAD 3.5 x 16 mm Synergy DES, d-LAD  2.25 x 16 mm Synergy DES  . CYSTOSCOPY W/ URETERAL STENT PLACEMENT Left 06/30/2017   Procedure: CYSTOSCOPY WITH RETROGRADE PYELOGRAM/URETERAL STENT PLACEMENT;  Surgeon: Lucas Mallow, MD;  Location: [facility];  Service: Urology;  Laterality: Left;  . CYSTOSCOPY WITH LITHOLAPAXY N/A 08/02/2017   Procedure: CYSTOSCOPY WITH BLADDER STONE EXTRACTION;  Surgeon: Lucas Mallow, MD;  [facility];  Service: Urology;  Laterality: N/A;  . CYSTOSCOPY WITH RETROGRADE PYELOGRAM, URETEROSCOPY AND STENT PLACEMENT Left 08/02/2017   Procedure: CYSTOSCOPY WITH LEFT RETROGRADE PYELOGRAM, URETEROSCOPY AND STENT EXCHANGE;  Surgeon: Lucas Mallow, MD;  [facility];  Service: Urology;  Laterality: Left;  . ESOPHAGOGASTRODUODENOSCOPY N/A 02/08/2013   Procedure: ESOPHAGOGASTRODUODENOSCOPY (EGD);  Surgeon: [name], MD;  Location[facility];  Service: Endoscopy;  Laterality: N/A;  . FLEXIBLE SIGMOIDOSCOPY N/A 02/19/2016   Procedure: FLEXIBLE SIGMOIDOSCOPY;  Surgeon: [name], MD;  [facility];  Service: Endoscopy;  Laterality: N/A;  . HOLMIUM LASER APPLICATION Left 5/46/5681   Procedure: HOLMIUM LASER APPLICATION;  Surgeon: Lucas Mallow, MD;  [facility];  Service: Urology;  Laterality: Left;  . LAPAROTOMY N/A 02/12/2013   Procedure: EXPLORATORY LAPAROTOMY PARTIAL COLECTOMY WITH COLOSTOMY;  Surgeon: [name], MD;  Location: [facility];  Service: General;  Laterality: N/A;  . LAPAROTOMY N/A 02/18/2013   Procedure: EXPLORATORY LAPAROTOMY/Closure of Wound;  Surgeon: [name], MD;  Location: [facility];  Service: General;  Laterality: N/A;  . LAPAROTOMY N/A 04/19/2016   Procedure: EXPLORATORY LAPAROTOMY, REPAIR OF ANASTAMOTIC LEAK;  Surgeon: Judeth Horn, MD;  Location: [facility];  Service: General;  Laterality: N/A;  . LEFT HEART CATHETERIZATION WITH CORONARY ANGIOGRAM  N/A 06/11/2014   Procedure: LEFT HEART CATHETERIZATION WITH CORONARY ANGIOGRAM;  Surgeon: [name], MD; CFX calcified, 30-40 percent, RCA calcified, 40/50/40%, PDA diffuse disease, LAD 40/75/90% s/p DES 2   . POLYPECTOMY  12/28/2018   Procedure: POLYPECTOMY;  Surgeon: [name], MD;  [facility];  Service: Endoscopy;;        Home Medications    Prior to Admission medications   Medication Sig Start Date End Date Taking? Authorizing Provider  aspirin EC 81 MG tablet Take 1 tablet (81 mg total) by mouth daily. 03/06/13   Love, Ivan Anchors, PA-C  atorvastatin (LIPITOR) 40 MG tablet TAKE 1 TABLET BY MOUTH EVERY DAY Patient taking differently: Take 40 mg by mouth every evening.  12/12/18   Burtis Junes, NP  clopidogrel (PLAVIX) 75 MG tablet TAKE 1 TABLET (75 MG TOTAL) BY MOUTH DAILY WITH BREAKFAST. Patient taking differently: Take 75 mg by mouth daily.  12/12/18   Burtis Junes, NP  diltiazem (CARDIZEM) 60 MG tablet TAKE 1.5 TABLETS (90 MG TOTAL) BY MOUTH 3 (THREE) TIMES DAILY. 01/01/19   Burtis Junes, NP  diltiazem (CARDIZEM) 90 MG tablet Take 1 tablet (90 mg total) by mouth 3 (three) times daily. 01/26/18   Burtis Junes, NP  ferrous sulfate 325 (65 FE) MG tablet Take 1 tablet (325 mg total) by mouth daily with breakfast. 01/15/14   Burtis Junes, NP  furosemide (LASIX) 40 MG tablet TAKE 1.5 TABLETS (60 MG TOTAL) BY MOUTH DAILY. 01/04/19   Burtis Junes, NP  levalbuterol [facility] HFA) 45 MCG/ACT inhaler Inhale 2 puffs into the lungs every 4 (four) hours as needed for wheezing.    [provider]  metoprolol tartrate (LOPRESSOR) 25 MG tablet TAKE 1.5 TABLETS (37.5 MG TOTAL) BY MOUTH 2 (TWO) TIMES DAILY. 12/03/18   Burtis Junes, NP  Multiple Vitamins-Minerals (CENTRUM SILVER 50+MEN PO) Take 1 tablet by mouth daily.    [provider]  nitroGLYCERIN (NITROSTAT) 0.4 MG SL tablet Place 0.4 mg under the tongue every 5 (five) minutes as needed  for chest pain.    [provider]  simethicone (GAS-X EXTRA STRENGTH) 125 MG chewable tablet Chew 125 mg by mouth 3 (three) times daily.    [provider]  spironolactone (ALDACTONE) 25 MG tablet Take 12.5 mg by mouth daily. 11/13/18   [provider]  tamsulosin (FLOMAX) 0.4 MG CAPS capsule TAKE 1 CAPSULE BY MOUTH EVERY DAY Patient taking differently: Take 0.4 mg by mouth daily.  11/01/18   Burtis Junes, NP    Family History Family History  Problem Relation Age of Onset  . Hypertension Father   . Heart disease Father        before age 60  . Diabetes Father   . Hypertension Mother   . Dementia Mother   . Parkinsonism Mother   . Liver cancer Maternal Grandmother        dx in her 22s  . Prostate cancer Maternal Uncle   . Leukemia Maternal Uncle     Social History Social History   Tobacco Use  . Smoking status: Former Smoker    Packs/day: 1.00    Years: 38.00    Pack years: 38.00    Types: Cigarettes    Quit date: 07/15/2008    Years since quitting: 10.5  . Smokeless tobacco: Never Used  Substance Use Topics  . Alcohol use: Yes    Alcohol/week: 0.0 standard drinks    Comment: none snice colostomy placed  . Drug use: No     Allergies   Patient has no known allergies.   Review of Systems Review of Systems  Constitutional: Negative for chills and fever.  HENT: Negative for congestion and facial swelling.   Eyes: Negative for discharge and visual disturbance.  Respiratory: Negative for shortness of breath.   Cardiovascular: Negative for chest pain and palpitations.  Gastrointestinal: Negative for abdominal pain, diarrhea and vomiting.  Musculoskeletal: Negative for arthralgias and myalgias.  Skin: Positive for wound. Negative for color change and rash.  Neurological: Negative for tremors, syncope and headaches.  Psychiatric/Behavioral: Negative for confusion and dysphoric mood.     Physical Exam Updated Vital Signs BP (!) 144/87  (BP Location: Right Wrist)   Pulse 93   Temp 98.2 F (36.8 C) (Oral)   Resp 16   Ht _0  (1.803 m)   Wt (!) 159.2 kg   SpO2 100%   BMI 48.95 kg/m   Physical Exam Vitals signs and nursing note reviewed.  Constitutional:      Appearance: He is well-developed.  HENT:     Head: Normocephalic and atraumatic.  Eyes:     Pupils: Pupils are equal, round, and reactive to light.  Neck:     Musculoskeletal: Normal range of motion and neck supple.     Vascular: No JVD.  Cardiovascular:     Rate and Rhythm: Normal rate and regular rhythm.     Heart sounds: No murmur. No friction rub. No gallop.   Pulmonary:     Effort: No respiratory distress.     Breath sounds: No wheezing.  Abdominal:     General: There is no distension.     Tenderness: There is no abdominal tenderness. There is no guarding or rebound.     Comments: Ostomy bag with good output.  No significant erythema induration or tenderness around the ostomy site.  Patient has a midline abdominal incision with a very small superficial defect to the inferior portion.  He has some capillary bleeding from the area.  Improved significantly with direct pressure.  Musculoskeletal: Normal range of motion.  Skin:    Coloration: Skin is not pale.     Findings: No rash.  Neurological:     Mental Status: He is alert and oriented to person, place, and time.  Psychiatric:        Behavior: Behavior normal.      ED Treatments / Results  Labs (all labs ordered are listed, but only abnormal results are displayed) Labs Reviewed  CBC - Abnormal; Notable for the following components:  Result Value   RBC 4.15 (*)    Hemoglobin 12.2 (*)    Platelets 138 (*)    All other components within normal limits  BASIC METABOLIC PANEL - Abnormal; Notable for the following components:   BUN 26 (*)    Creatinine, Ser 2.16 (*)    GFR calc non Af Amer 31 (*)    GFR calc Af Amer 36 (*)    All other components within normal limits    EKG None   Radiology No results found.  Procedures Wound repair  Date/Time: 01/19/2019 8:52 AM Performed by: Deno Etienne, DO Authorized by: Deno Etienne, DO  Consent: Verbal consent obtained. Risks and benefits: risks, benefits and alternatives were discussed Consent given by: patient Patient understanding: patient states understanding of the procedure being performed Imaging studies: imaging studies not available Patient identity confirmed: verbally with patient Time out: Immediately prior to procedure a "time out" was called to verify the correct patient, procedure, equipment, support staff and site/side marked as required. Preparation: Patient was prepped and draped in the usual sterile fashion. Local anesthesia used: yes Anesthesia: local infiltration  Anesthesia: Local anesthesia used: yes Local Anesthetic: lidocaine 2% with epinephrine Anesthetic total: 3 mL  Sedation: Patient sedated: no  Patient tolerance: patient tolerated the procedure well with no immediate complications Comments: Figure-of-eight stitch applied with hemostasis    (including critical care time)  Medications Ordered in ED Medications  lidocaine-EPINEPHrine (XYLOCAINE W/EPI) 2 %-1:200000 (PF) injection (has no administration in time range)  lidocaine-EPINEPHrine (XYLOCAINE W/EPI) 2 %-1:200000 (PF) injection 20 mL (20 mLs Intradermal Given 01/19/19 0845)     Initial Impression / Assessment and Plan / ED Course  I have reviewed the triage vital signs and the nursing notes.  Pertinent labs & imaging results that were available during my care of the patient were reviewed by me and considered in my medical decision making (see chart for details).        68 yo M with a chief complaints of bleeding from an old surgical site.  Patient has had a delayed healing of this wound.  Has had some bleeding for the past week.  Woke up in the middle the night and felt that his abdomen was wet.  Has some capillary bleeding  from the area.  This was initially resolved with direct pressure though as this is the patient's second visit I will apply a figure-of-eight stitch.  Have them take it out at wound care and reassess.  General surgery follow-up.  8:54 AM:  I have discussed the diagnosis/risks/treatment options with the patient and believe the pt to be eligible for discharge home to follow-up with PCP, Gen surgery, wound. We also discussed returning to the ED immediately if new or worsening sx occur. We discussed the sx which are most concerning (e.g., bleeding not controlled with direct pressure) that necessitate immediate return. Medications administered to the patient during their visit and any new prescriptions provided to the patient are listed below.  Medications given during this visit Medications  lidocaine-EPINEPHrine (XYLOCAINE W/EPI) 2 %-1:200000 (PF) injection (has no administration in time range)  lidocaine-EPINEPHrine (XYLOCAINE W/EPI) 2 %-1:200000 (PF) injection 20 mL (20 mLs Intradermal Given 01/19/19 0845)     The patient appears reasonably screen and/or stabilized for discharge and I doubt any other medical condition or other [facility] requiring further screening, evaluation, or treatment in the ED at this time prior to discharge.    Final Clinical Impressions(s) / ED Diagnoses   Final diagnoses:  Bleeding from wound    ED Discharge Orders    None       Deno Etienne, [place] 01/19/19 [phone]

## 2019-01-23 DIAGNOSIS — T8189XS Other complications of procedures, not elsewhere classified, sequela: Secondary | ICD-10-CM | POA: Diagnosis not present

## 2019-01-24 ENCOUNTER — Encounter (HOSPITAL_BASED_OUTPATIENT_CLINIC_OR_DEPARTMENT_OTHER): Payer: Medicare Other | Attending: Internal Medicine | Admitting: Internal Medicine

## 2019-01-24 ENCOUNTER — Other Ambulatory Visit: Payer: Self-pay

## 2019-01-24 DIAGNOSIS — Z7982 Long term (current) use of aspirin: Secondary | ICD-10-CM | POA: Diagnosis not present

## 2019-01-24 DIAGNOSIS — Z85038 Personal history of other malignant neoplasm of large intestine: Secondary | ICD-10-CM | POA: Diagnosis not present

## 2019-01-24 DIAGNOSIS — Z955 Presence of coronary angioplasty implant and graft: Secondary | ICD-10-CM | POA: Diagnosis not present

## 2019-01-24 DIAGNOSIS — S31105S Unspecified open wound of abdominal wall, periumbilic region without penetration into peritoneal cavity, sequela: Secondary | ICD-10-CM | POA: Insufficient documentation

## 2019-01-24 DIAGNOSIS — Z7902 Long term (current) use of antithrombotics/antiplatelets: Secondary | ICD-10-CM | POA: Diagnosis not present

## 2019-01-24 DIAGNOSIS — I5022 Chronic systolic (congestive) heart failure: Secondary | ICD-10-CM | POA: Diagnosis not present

## 2019-01-24 DIAGNOSIS — X58XXXD Exposure to other specified factors, subsequent encounter: Secondary | ICD-10-CM | POA: Diagnosis not present

## 2019-01-24 DIAGNOSIS — I872 Venous insufficiency (chronic) (peripheral): Secondary | ICD-10-CM | POA: Insufficient documentation

## 2019-01-24 DIAGNOSIS — G4733 Obstructive sleep apnea (adult) (pediatric): Secondary | ICD-10-CM | POA: Diagnosis not present

## 2019-01-24 DIAGNOSIS — T8131XD Disruption of external operation (surgical) wound, not elsewhere classified, subsequent encounter: Secondary | ICD-10-CM | POA: Insufficient documentation

## 2019-01-24 DIAGNOSIS — N183 Chronic kidney disease, stage 3 unspecified: Secondary | ICD-10-CM | POA: Insufficient documentation

## 2019-01-24 DIAGNOSIS — Z6841 Body Mass Index (BMI) 40.0 and over, adult: Secondary | ICD-10-CM | POA: Insufficient documentation

## 2019-01-24 DIAGNOSIS — I130 Hypertensive heart and chronic kidney disease with heart failure and stage 1 through stage 4 chronic kidney disease, or unspecified chronic kidney disease: Secondary | ICD-10-CM | POA: Diagnosis not present

## 2019-01-24 DIAGNOSIS — J449 Chronic obstructive pulmonary disease, unspecified: Secondary | ICD-10-CM | POA: Insufficient documentation

## 2019-01-24 DIAGNOSIS — I2510 Atherosclerotic heart disease of native coronary artery without angina pectoris: Secondary | ICD-10-CM | POA: Insufficient documentation

## 2019-01-24 DIAGNOSIS — T8131XA Disruption of external operation (surgical) wound, not elsewhere classified, initial encounter: Secondary | ICD-10-CM | POA: Diagnosis not present

## 2019-01-25 NOTE — Progress Notes (Signed)
Larry [name], [name] ([id number]) Visit Report for 01/24/2019 HPI Details Patient Name: Date of Service: [name], SCHELER 01/24/2019 3:30 PM Medical Record [id number] Patient Account Number: [id number] Date of Birth/Sex: Treating RN: 05/29/1950 (68 y.o. M) Primary Care Provider: Lin Landsman Other Clinician: Referring Provider: Treating Provider/Extender:Robson, Fernanda Drum, [name] in Treatment: 2 History of Present Illness Location: Patient presents with a wound to left lower leg. Quality: Patient reports No Pain. Severity: Patient states wound(s) are getting worse. Duration: Patient has had the wound for < 2 weeks prior to presenting for treatment Modifying Factors: pvd HPI Description: pt smoked until 5 years ago. cancer surgery 1 year ago. has ruq stoma. cv studies last year revealed non-compressible vessels. recent pe by pcp yielded some heart abnormality and he is scheduled for stress test. 5/24 2 new wounds on rt leg. left side healed 08/12/14; several small wounds on the right lateral leg and one wound on the left leg which is new. The edema control here does not look adequate. There is also some degree of maceration around the wounds on the right lateral leg READMISSION 09/14/2018 This is a patient we had in this clinic in 2013 and then again in 2016 with wounds on his right lateral leg secondary to chronic venous insufficiency. He was cared for I believe in 2016 by Dr. Lindon Romp. The patient is here for review of 6 small wounds within a large surgical area on his abdomen. The patient had surgery for a malignant neoplasm of the sigmoid colon with a colostomy. I cannot see the date of the exact surgery in epic. In February 2018 he had an attempted colostomy reversal however he had a microperforation I believe at the anastomosis. He required a repeat laparotomy. He had an ileostomy raised at that time. I believe the surgical wound at that time was left to close via  secondary intention. The patient is unaware whether he had a wound VAC on this. In any case this almost is completely closed except he has been left with 6 small eraser head sized areas on the abdomen that have not healed. I am not exactly sure how he was how he is been dressing this. He has been followed with frequent visits in general surgery dating back at least over a year. Past medical history includes chronic systolic heart failure, stage III chronic renal failure. Malignant neoplasm of the sigmoid colon, morbid obesity. 7/31; not a lot of change 6 small wounds within a large surgical area on his abdomen. None of these are hyper granulated as opposed to last week. None of them have exposed mesh or surrounding infection. He has been using [facility] 8/14- He comes after 2 weeks, he has only 2 open wounds that I can tell this time, he has been using Hydrofera Blue 8/28; 2-week follow-up. Very superficial areas on the abdominal surgical site scar. He is using Hydrofera Blue and this seems to be doing a good job. He has not made it down to the medical supply store to see if he can get an abdominal binder or something to keep the tension off this wound area. READMISSION 01/04/2019 This is a 68 year old man we had in the clinic from July through August 2020. He had wounds in the midline abdominal scar. [Please see history from my note of 09/14/2018}. When he was here last time we used Hydrofera Blue. Things seem to be improving. He did not discharge in a healed state his last visit here was on 8/28. He  states he continued to use the [facility] and at 1 point this was very close to healing but more recently it is reopened. The wounds are basically in the same state as last time superficial friable easily bleeding wounds. Some hyper granulation. He has an abdominal binder that he got at [facility] however he says it is too tight for him to get on. Past medical history is reviewed; he  has coronary artery disease, stage III chronic renal failure heart failure with reduced ejection fraction, COPD, hypertension, obstructive sleep apnea. He was hospitalized since he was here last time for a severe pharyngitis. 11/19; the patient was in the ER on 11/16; this was for oozing from his abdominal wound. He was cauterized with quick clot gauze and epi and silver nitrate. This was felt to be venous oozing. He has very friable wounds in this area. He is using his abdominal binder. 12/3; the patient was in the ER with bleeding from the lower part of his incision in the abdomen on 11/28. Had a stitch placed to contain the bleeding area. This was just underneath his major wound. He saw general surgery yesterday and apparently we are to remove the stitch in 2 weeks. The patient is on Plavix and aspirin for coronary artery disease with stents. His bleeding was felt to be capillary. Electronic Signature(s) Signed: 01/25/2019 7:57:15 AM By: Larry Ham MD Entered By: Larry [name] on 01/24/2019 17:58:10 -------------------------------------------------------------------------------- Chemical Cauterization Details Patient Name: Date of Service: [name], Larry [name] 01/24/2019 3:30 PM Medical Record [id number] Patient Account Number: [id number] Date of Birth/Sex: Treating RN: 06/05/1950 (68 y.o. M) Primary Care Provider: Lin Landsman Other Clinician: Referring Provider: Treating Provider/Extender:Robson, Fernanda Drum, [name] in Treatment: 2 Procedure Performed for: Wound #8 Abdomen - midline Performed By: Physician Ricard Dillon., MD Post Procedure Diagnosis Same as Pre-procedure Notes silver nitrate used. Electronic Signature(s) Signed: 01/25/2019 7:57:15 AM By: Larry Ham MD Entered By: Larry [name] on 01/24/2019 17:56:28 -------------------------------------------------------------------------------- Physical Exam Details Patient Name: Date of  Service: [name], [name] 01/24/2019 3:30 PM Medical Record [id number] Patient Account Number: [id number] Date of Birth/Sex: Treating RN: [birth year] (68 y.o. M) Primary Care Provider: Lin Landsman Other Clinician: Referring Provider: Treating Provider/Extender:Robson, Fernanda Drum, [name] in Treatment: 2 Constitutional Sitting or standing Blood Pressure is within target range for patient.. Pulse regular and within target range for patient.[name] Kitchen Respirations regular, non-labored and within target range.. Temperature is normal and within the target range for the patient.[name] Kitchen Appears in no distress. Gastrointestinal (GI) Abdomen is distended but nontender.. Notes Wound exam; the area in question is in the midline part of his abdomen. This is a surgical scar from a laparotomy. He has an ileostomy next to this. His major wound is in the middle part of this. It is covered and adherent fibrinous debris I they are not debride this. Below this he appears to have some very friable tissue that was silver nitrate applied yesterday in the surgical office. We did this again today Electronic Signature(s) Signed: 01/25/2019 7:57:15 AM By: Larry Ham MD Entered By: Larry [name] on 01/24/2019 17:59:14 -------------------------------------------------------------------------------- Physician Orders Details Patient Name: Date of Service: CHANS, EWERT 01/24/2019 3:30 PM Medical Record [id number] Patient Account Number: [id number] Date of Birth/Sex: Treating RN: 12-28-1950 (68 y.o. [name] Primary Care Provider: Lin Landsman Other Clinician: Referring Provider: Treating Provider/Extender:Robson, Fernanda Drum, [name] in Treatment: 2 Verbal / Phone Orders: No Diagnosis Coding ICD-10 Coding Code Description Disruption of external operation (surgical) wound, not  elsewhere classified, subsequent T81.31XD encounter Unspecified open wound of abdominal wall,  periumbilic region without penetration into peritoneal S31.105S cavity, sequela Follow-up Appointments Return Appointment in 2 weeks. Dressing Change Frequency Change Dressing every other day. Wound Cleansing May shower and wash wound with soap and water. - with dressing change Primary Wound Dressing Silver Collagen - moisten with hydrogel. Secondary Dressing Dry Gauze ABD pad Other: - abdominal binder while up during the day. may remove at night. Electronic Signature(s) Signed: 01/24/2019 6:37:14 PM By: Deon Pilling Signed: 01/25/2019 7:57:15 AM By: Larry Ham MD Entered By: Deon Pilling on 01/24/2019 17:09:10 -------------------------------------------------------------------------------- Problem List Details Patient Name: Date of Service: NIYAN, SCHOOF 01/24/2019 3:30 PM Medical Record [id number] Patient Account Number: [id number] Date of Birth/Sex: Treating RN: [birth year] (68 y.o. [name] Primary Care Provider: Lin Landsman Other Clinician: Referring Provider: Treating Provider/Extender:Robson, Fernanda Drum, [name] in Treatment: 2 Active Problems ICD-10 Evaluated Encounter Code Description Active Date Today Diagnosis T81.31XD Disruption of external operation (surgical) wound, not 01/04/2019 No Yes elsewhere classified, subsequent encounter S31.105S Unspecified open wound of abdominal wall, [id number] No Yes periumbilic region without penetration into peritoneal cavity, sequela Inactive Problems Resolved Problems Electronic Signature(s) Signed: 01/25/2019 7:57:15 AM By: Larry Ham MD Entered By: Larry [name] on 01/24/2019 17:56:15 -------------------------------------------------------------------------------- Progress Note Details Patient Name: Date of Service: [name] 01/24/2019 3:30 PM Medical Record [id number] Patient Account Number: [id number] Date of Birth/Sex: Treating RN: 21-Apr-1950 (68 y.o.  M) Primary Care Provider: Lin Landsman Other Clinician: Referring Provider: Treating Provider/Extender:Robson, Fernanda Drum, [name] in Treatment: 2 Subjective History of Present Illness (HPI) The following HPI elements were documented for the patient's wound: Location: Patient presents with a wound to left lower leg. Quality: Patient reports No Pain. Severity: Patient states wound(s) are getting worse. Duration: Patient has had the wound for < 2 weeks prior to presenting for treatment Modifying Factors: pvd pt smoked until 5 years ago. cancer surgery 1 year ago. has ruq stoma. cv studies last year revealed non- compressible vessels. recent pe by pcp yielded some heart abnormality and he is scheduled for stress test. 5/24 2 new wounds on rt leg. left side healed 08/12/14; several small wounds on the right lateral leg and one wound on the left leg which is new. The edema control here does not look adequate. There is also some degree of maceration around the wounds on the right lateral leg READMISSION 09/14/2018 This is a patient we had in this clinic in 2013 and then again in 2016 with wounds on his right lateral leg secondary to chronic venous insufficiency. He was cared for I believe in 2016 by Dr. Lindon Romp. The patient is here for review of 6 small wounds within a large surgical area on his abdomen. The patient had surgery for a malignant neoplasm of the sigmoid colon with a colostomy. I cannot see the date of the exact surgery in epic. In February 2018 he had an attempted colostomy reversal however he had a microperforation I believe at the anastomosis. He required a repeat laparotomy. He had an ileostomy raised at that time. I believe the surgical wound at that time was left to close via secondary intention. The patient is unaware whether he had a wound VAC on this. In any case this almost is completely closed except he has been left with 6 small eraser head sized areas on  the abdomen that have not healed. I am not exactly sure how he was how he is been dressing this.  He has been followed with frequent visits in general surgery dating back at least over a year. Past medical history includes chronic systolic heart failure, stage III chronic renal failure. Malignant neoplasm of the sigmoid colon, morbid obesity. 7/31; not a lot of change 6 small wounds within a large surgical area on his abdomen. None of these are hyper granulated as opposed to last week. None of them have exposed mesh or surrounding infection. He has been using [facility] 8/14- He comes after 2 weeks, he has only 2 open wounds that I can tell this time, he has been using Hydrofera Blue 8/28; 2-week follow-up. Very superficial areas on the abdominal surgical site scar. He is using Hydrofera Blue and this seems to be doing a good job. He has not made it down to the medical supply store to see if he can get an abdominal binder or something to keep the tension off this wound area. READMISSION 01/04/2019 This is a 68 year old man we had in the clinic from July through August 2020. He had wounds in the midline abdominal scar. [Please see history from my note of 09/14/2018}. When he was here last time we used Hydrofera Blue. Things seem to be improving. He did not discharge in a healed state his last visit here was on 8/28. He states he continued to use the [facility] and at 1 point this was very close to healing but more recently it is reopened. The wounds are basically in the same state as last time superficial friable easily bleeding wounds. Some hyper granulation. He has an abdominal binder that he got at [facility] however he says it is too tight for him to get on. Past medical history is reviewed; he has coronary artery disease, stage III chronic renal failure heart failure with reduced ejection fraction, COPD, hypertension, obstructive sleep apnea. He was hospitalized since he was  here last time for a severe pharyngitis. 11/19; the patient was in the ER on 11/16; this was for oozing from his abdominal wound. He was cauterized with quick clot gauze and epi and silver nitrate. This was felt to be venous oozing. He has very friable wounds in this area. He is using his abdominal binder. 12/3; the patient was in the ER with bleeding from the lower part of his incision in the abdomen on 11/28. Had a stitch placed to contain the bleeding area. This was just underneath his major wound. He saw general surgery yesterday and apparently we are to remove the stitch in 2 weeks. The patient is on Plavix and aspirin for coronary artery disease with stents. His bleeding was felt to be capillary. Objective Constitutional Sitting or standing Blood Pressure is within target range for patient.. Pulse regular and within target range for patient.[name] Kitchen Respirations regular, non-labored and within target range.. Temperature is normal and within the target range for the patient.[name] Kitchen Appears in no distress. Vitals Time Taken: 4:28 PM, Height: 71 in, Weight: 344 lbs, BMI: 48, Temperature: 98.2 F, Pulse: 76 bpm, Respiratory Rate: 18 breaths/min, Blood Pressure: 127/62 mmHg. Gastrointestinal (GI) Abdomen is distended but nontender.. General Notes: Wound exam; the area in question is in the midline part of his abdomen. This is a surgical scar from a laparotomy. He has an ileostomy next to this. His major wound is in the middle part of this. It is covered and adherent fibrinous debris I they are not debride this. Below this he appears to have some very friable tissue that was silver nitrate applied  yesterday in the surgical office. We did this again today Integumentary (Hair, Skin) Wound #8 status is Open. Original cause of wound was Surgical Injury. The wound is located on the Abdomen - midline. The wound measures 4.5cm length x 3.5cm width x 0.1cm depth; 12.37cm^2 area and 1.237cm^3 volume. There is  Fat Layer (Subcutaneous Tissue) Exposed exposed. There is no tunneling or undermining noted. There is a medium amount of serosanguineous drainage noted. The wound margin is flat and intact. There is medium (34-66%) red, friable granulation within the wound bed. There is a medium (34-66%) amount of necrotic tissue within the wound bed including Adherent Slough. General Notes: 2 sutures visible in center of wound bed - per pt suture was placed on 11/28 in ER due to bleeding Assessment Active Problems ICD-10 Disruption of external operation (surgical) wound, not elsewhere classified, subsequent encounter Unspecified open wound of abdominal wall, periumbilic region without penetration into peritoneal cavity, sequela Procedures Wound #8 Pre-procedure diagnosis of Wound #8 is an Open Surgical Wound located on the Abdomen - midline . An Chemical Cauterization procedure was performed by Ricard Dillon., MD. Post procedure Diagnosis Wound #8: Same as Pre-Procedure Notes: silver nitrate used. Plan Follow-up Appointments: Return Appointment in 2 weeks. Dressing Change Frequency: Change Dressing every other day. Wound Cleansing: May shower and wash wound with soap and water. - with dressing change Primary Wound Dressing: Silver Collagen - moisten with hydrogel. Secondary Dressing: Dry Gauze ABD pad Other: - abdominal binder while up during the day. may remove at night. 1. Silver collagen moistened with hydrogel. I do not want anything sticking to the wound and apparently Hydrofera Blue was doing that 2. Of this sizable wound with some depth he has material in the bottom of this wound that is not going to be easy to remove. Certainly I am very intimidated about using mechanical debridement. We use silver collagen 3. Underneath this he has lost surface epithelialization even wiping this with gauze causes bleeding. I applied silver nitrate to this as did the surgeon yesterday. 4. He has  sutures that were applied on 11/28 in the ER I will see about removing these in 2 weeks hopefully this will not cause excessive bleeding Electronic Signature(s) Signed: 01/25/2019 7:57:15 AM By: Larry Ham MD Entered By: Larry [name] on 01/24/2019 18:00:31 -------------------------------------------------------------------------------- SuperBill Details Patient Name: Date of Service: [name] 01/24/2019 Medical Record [id number] Patient Account Number: [id number] Date of Birth/Sex: Treating RN: 07/09/1950 (68 y.o. [name] Primary Care Provider: Lin Landsman Other Clinician: Referring Provider: Treating Provider/Extender:Robson, Fernanda Drum, [name] in Treatment: 2 Diagnosis Coding ICD-10 Codes Code Description Disruption of external operation (surgical) wound, not elsewhere classified, subsequent T81.31XD encounter Unspecified open wound of abdominal wall, periumbilic region without penetration into peritoneal S31.105S cavity, sequela Facility Procedures CPT4: Description Modifier Quantity Code [id number] - CHEM CAUT GRANULATION TISS 1 ICD-10 Diagnosis Description T81.31XD Disruption of external operation (surgical) wound, not elsewhere classified, subsequent encounter S31.105S Unspecified open wound  of abdominal wall, periumbilic region without penetration into peritoneal cavity, sequela Physician Procedures CPT4: Description Modifier Quantity Code [id number] - WC PHYS CHEM CAUT GRAN TISSUE 1 ICD-10 Diagnosis Description T81.31XD Disruption of external operation (surgical) wound, not elsewhere classified, subsequent encounter S31.105S Unspecified open  wound of abdominal wall, periumbilic region without penetration into peritoneal cavity, sequela Electronic Signature(s) Signed: 01/25/2019 7:57:15 AM By: Larry Ham MD Entered By: Larry [name] on 01/24/2019 18:00:40

## 2019-01-29 NOTE — Progress Notes (Signed)
Selkirk, Embry L. ([id number]) Visit Report for 01/24/2019 Arrival Information Details Patient Name: Date of Service: MARQUAVIS, MARDIS 01/24/2019 3:30 PM Medical Record [id number] Patient Account Number: [id number] Date of Birth/Sex: Treating RN: Sep 12, 1950 (68 y.o. [name] Primary Care Yancarlos Berthold: Lin Landsman Other Clinician: Referring Shaqueena Mauceri: Treating Melannie Metzner/Extender:Robson, Fernanda Drum, [name] in Treatment: 2 Visit Information History Since Last Visit Added or deleted any medications: No Patient Arrived: Ambulatory Any new allergies or adverse reactions: No Arrival Time: 16:24 Had a fall or experienced change in No Accompanied By: alone activities of daily living that may affect Transfer Assistance: None risk of falls: Patient Identification Verified: Yes Signs or symptoms of abuse/neglect since last No Secondary Verification Process Completed: Yes visito Patient Requires Transmission-Based No Hospitalized since last visit: No Precautions: Implantable device outside of the clinic excluding No Patient Has Alerts: No cellular tissue based products placed in the center since last visit: Has Dressing in Place as Prescribed: Yes Pain Present Now: No Electronic Signature(s) Signed: 01/28/2019 5:51:44 PM By: Levan Hurst RN, BSN Entered By: Levan Hurst on 01/24/2019 16:25:06 -------------------------------------------------------------------------------- Encounter Discharge Information Details Patient Name: Date of Service: [name] 01/24/2019 3:30 PM Medical Record [id number] Patient Account Number: [id number] Date of Birth/Sex: Treating RN: 09-07-1950 (68 y.o. [name] Primary Care Amonie Wisser: Lin Landsman Other Clinician: Referring Rhylie Stehr: Treating Burnell Hurta/Extender:Robson, Fernanda Drum, [name] in Treatment: 2 Encounter Discharge Information Items Discharge Condition: Stable Ambulatory Status:  Ambulatory Discharge Destination: Home Transportation: Private Auto Accompanied By: self Schedule Follow-up Appointment: Yes Clinical Summary of Care: Patient Declined Electronic Signature(s) Signed: 01/24/2019 5:56:19 PM By: Baruch Gouty RN, BSN Entered By: Baruch Gouty on 01/24/2019 17:24:03 -------------------------------------------------------------------------------- Multi Wound Chart Details Patient Name: Date of Service: [name] 01/24/2019 3:30 PM Medical Record [id number] Patient Account Number: [id number] Date of Birth/Sex: Treating RN: 20-Sep-1950 (68 y.o. M) Primary Care Matisse Roskelley: Lin Landsman Other Clinician: Referring Sorcha Rotunno: Treating Dezarae Mcclaran/Extender:Robson, Fernanda Drum, [name] in Treatment: 2 Vital Signs Height(in): 71 Pulse(bpm): 27 Weight(lbs): 344 Blood Pressure(mmHg): 127/62 Body Mass Index(BMI): 48 Temperature(F): 98.2 Respiratory 18 Rate(breaths/min): Photos: [8:No Photos] [N/A:N/A] Wound Location: [8:Abdomen - midline] [N/A:N/A] Wounding Event: [8:Surgical Injury] [N/A:N/A] Primary Etiology: [8:Open Surgical Wound] [N/A:N/A] Comorbid History: [8:Asthma, Chronic Obstructive Pulmonary Disease (COPD), Sleep Apnea, Arrhythmia, Congestive Heart Failure, Coronary Artery Disease, Hypertension, Peripheral Arterial Disease, Peripheral Venous Disease] [N/A:N/A] Date Acquired: [8:02/21/2017] [N/A:N/A] Weeks of Treatment: [8:2] [N/A:N/A] Wound Status: [8:Open] [N/A:N/A] Measurements L x W x D 4.5x3.5x0.1 [N/A:N/A] (cm) Area (cm) : [8:12.37] [N/A:N/A] Volume (cm) : [8:1.237] [N/A:N/A] % Reduction in Area: [8:84.50%] [N/A:N/A] % Reduction in Volume: 84.50% [N/A:N/A] Classification: [8:Full Thickness Without Exposed Support Structures] [N/A:N/A] Exudate Amount: [8:Medium] [N/A:N/A] Exudate Type: [8:Serosanguineous] [N/A:N/A] Exudate Color: [8:red, brown] [N/A:N/A] Wound Margin: [8:Flat and Intact] [N/A:N/A] Granulation  Amount: [8:Medium (34-66%)] [N/A:N/A] Granulation Quality: [8:Red, Friable] [N/A:N/A] Necrotic Amount: [8:Medium (34-66%)] [N/A:N/A] Exposed Structures: [8:Fat Layer (Subcutaneous N/A Tissue) Exposed: Yes Fascia: No Tendon: No Muscle: No Joint: No Bone: No] Epithelialization: [8:Small (1-33%)] [N/A:N/A] Assessment Notes: [8:2 sutures visible in center of N/A wound bed - per pt suture was placed on 11/28 in ER due to bleeding Chemical Cauterization] [N/A:N/A] Treatment Notes Wound #8 (Abdomen - midline) 3. Primary Dressing Applied Collegen AG 4. Secondary Dressing ABD Pad 5. Secured With Recruitment consultant) Signed: 01/25/2019 7:57:15 AM By: Linton Ham MD Entered By: Linton Ham on 01/24/2019 17:56:19 -------------------------------------------------------------------------------- Multi-Disciplinary Care Plan Details Patient Name: Date of Service: ROSHOD, CAPO 01/24/2019 3:30 PM Medical Record [id number] Patient Account  Number: [id number] Date of Birth/Sex: Treating RN: 1950/05/26 (68 y.o. [name] Primary Care Yacqub Baston: Lin Landsman Other Clinician: Referring Kinslea Frances: Treating Wendy Mikles/Extender:Robson, Fernanda Drum, [name] in Treatment: 2 Active Inactive Wound/Skin Impairment Nursing Diagnoses: Impaired tissue integrity Goals: Ulcer/skin breakdown will have a volume reduction of 30% by week 4 Date Initiated: 01/04/2019 Target Resolution Date: 02/15/2019 Goal Status: Active Interventions: Provide education on ulcer and skin care Notes: Electronic Signature(s) Signed: 01/24/2019 6:37:14 PM By: Deon Pilling Entered By: Deon Pilling on 01/24/2019 17:07:23 -------------------------------------------------------------------------------- Pain Assessment Details Patient Name: Date of Service: GABRIEL, RENDINA 01/24/2019 3:30 PM Medical Record [id number] Patient Account Number: [id number] Date of Birth/Sex: Treating  RN: 1951/01/21 (68 y.o. [name] Primary Care Thalia Turkington: Lin Landsman Other Clinician: Referring Madhuri Vacca: Treating Christpher Stogsdill/Extender:Robson, Fernanda Drum, [name] in Treatment: 2 Active Problems Location of Pain Severity and Description of Pain Patient Has Paino No Site Locations Pain Management and Medication Current Pain Management: Electronic Signature(s) Signed: 01/28/2019 5:51:44 PM By: Levan Hurst RN, BSN Entered By: Levan Hurst on 01/24/2019 16:29:03 -------------------------------------------------------------------------------- Patient/Caregiver Education Details Patient Name: Date of Service: [name] 12/3/2020andnbsp3:30 PM Medical Record Patient Account Number: [id number] Number: Treating RN: Deon Pilling Date of Birth/Gender: [birth year] (67 y.o. M) Other Clinician: Primary Care Physician: Lin Landsman Treating Linton Ham Referring Physician: Physician/Extender: Katharine Look in Treatment: 2 Education Assessment Education Provided To: Patient Education Topics Provided Wound/Skin Impairment: Handouts: Skin Care Do's and Dont's Methods: Explain/Verbal Responses: Reinforcements needed Electronic Signature(s) Signed: 01/24/2019 6:37:14 PM By: Deon Pilling Entered By: Deon Pilling on 01/24/2019 17:07:32 -------------------------------------------------------------------------------- Wound Assessment Details Patient Name: Date of Service: LUV, RASKA 01/24/2019 3:30 PM Medical Record [id number] Patient Account Number: [id number] Date of Birth/Sex: Treating RN: 03-15-1950 (68 y.o. [name] Primary Care Edwen Mclester: Lin Landsman Other Clinician: Referring Velmer Broadfoot: Treating Cali Hope/Extender:Robson, Fernanda Drum, [name] in Treatment: 2 Wound Status Wound Number: 8 Primary Open Surgical Wound Etiology: Wound Location: Abdomen - midline Wound Open Wounding Event: Surgical  Injury Status: Date Acquired: 02/21/2017 Comorbid Asthma, Chronic Obstructive Pulmonary Weeks Of Treatment: 2 History: Disease (COPD), Sleep Apnea, Arrhythmia, Clustered Wound: No Congestive Heart Failure, Coronary Artery Disease, Hypertension, Peripheral Arterial Disease, Peripheral Venous Disease Photos Wound Measurements Length: (cm) 4.5 % Redu Width: (cm) 3.5 % Redu Depth: (cm) 0.1 Epithe Area: (cm) 12.37 Tunne Volume: (cm) 1.237 Under Wound Description Classification: Full Thickness Without Exposed Support Structures Wound Flat and Intact Margin: Exudate Medium Amount: Exudate Serosanguineous Type: Exudate red, brown Color: Wound Bed Granulation Amount: Medium (34-66%) Granulation Quality: Red, Friable Necrotic Amount: Medium (34-66%) Necrotic Quality: Adherent Slough Foul Odor After Cleansing: No Slough/Fibrino Yes Exposed Structure Fascia Exposed: No Fat Layer (Subcutaneous Tissue) Exposed: Yes Tendon Exposed: No Muscle Exposed: No Joint Exposed: No Bone Exposed: No ction in Area: 84.5% ction in Volume: 84.5% lialization: Small (1-33%) ling: No mining: No Assessment Notes 2 sutures visible in center of wound bed - per pt suture was placed on 11/28 in ER due to bleeding Treatment Notes Wound #8 (Abdomen - midline) 3. Primary Dressing Applied Collegen AG 4. Secondary Dressing ABD Pad 5. Secured With Recruitment consultant) Signed: 01/28/2019 5:51:44 PM By: Levan Hurst RN, BSN Signed: 01/29/2019 4:34:42 PM By: Mikeal Hawthorne EMT/HBOT Entered By: Mikeal Hawthorne on 01/28/2019 13:42:35 -------------------------------------------------------------------------------- Vitals Details Patient Name: Date of Service: [name] 01/24/2019 3:30 PM Medical Record [id number] Patient Account Number: [id number] Date of Birth/Sex: Treating RN: 12-03-1950 (68 y.o. [name] Primary Care Andersen Iorio: Lin Landsman Other  Clinician: Referring Tyeson Tanimoto: Treating Anhelica Fowers/Extender:Robson, Fernanda Drum, [name] in Treatment: 2 Vital Signs Time Taken: 16:28 Temperature (F): 98.2 Height (in): 71 Pulse (bpm): 76 Weight (lbs): 344 Respiratory Rate (breaths/min): 18 Body Mass Index (BMI): 48 Blood Pressure (mmHg): 127/62 Reference Range: 80 - 120 mg / dl Electronic Signature(s) Signed: 01/28/2019 5:51:44 PM By: Levan Hurst RN, BSN Entered By: Levan Hurst on 01/24/2019 16:28:47

## 2019-01-29 NOTE — Progress Notes (Signed)
Holan, Trejan L. ([id number]) Visit Report for 01/04/2019 Allergy List Details Patient Name: Date of Service: Larry [name], Larry [name] 01/04/2019 3:00 [name] Medical Record [id number] Patient Account Number: [id number] Date of Birth/Sex: Treating RN: 1950-12-07 (67 y.o. Jerilynn Mages) Carlene Coria Primary Care Zondra Lawlor: Lin Landsman Other Clinician: Referring Lauren Aguayo: Treating Allison Deshotels/Extender:Robson, Fernanda Drum, [name] in Treatment: 0 Allergies Active Allergies No Known Drug Allergies Allergy Notes Electronic Signature(s) Signed: 01/29/2019 2:49:42 [name] By: Carlene Coria RN Entered By: Carlene Coria on 01/04/2019 15:11:50 -------------------------------------------------------------------------------- Arrival Information Details Patient Name: Date of Service: Larry [name] 01/04/2019 3:00 [name] Medical Record [id number] Patient Account Number: [id number] Date of Birth/Sex: Treating RN: 24-Apr-1950 (67 y.o. Jerilynn Mages) Carlene Coria Primary Care Dequincy Born: Lin Landsman Other Clinician: Referring Ygnacio Fecteau: Treating Breeze Berringer/Extender:Robson, Fernanda Drum, [name] in Treatment: 0 Visit Information Patient Arrived: Ambulatory Arrival Time: 15:04 Accompanied By: self Transfer Assistance: None Patient Identification Verified: Yes Secondary Verification Process Completed: Yes Patient Requires Transmission-Based No Precautions: Patient Has Alerts: No Electronic Signature(s) Signed: 01/29/2019 2:49:42 [name] By: Carlene Coria RN Entered By: Waunita Schooner History Since Last Visit All ordered tests and consults were completed: No Added or deleted any medications: No Any new allergies or adverse reactions: No Had a fall or experienced change in activities of daily living that may affect risk of falls: No Signs or symptoms of abuse/neglect since last visito No Hospitalized since last visit: No Implantable device outside of the clinic excluding cellular tissue based products placed in  the center since last visit: No Pain Present Now: No -------------------------------------------------------------------------------- Clinic Level of Care Assessment Details Patient Name: Date of Service: Larry [name], Larry [name] 01/04/2019 3:00 [name] Medical Record [id number] Patient Account Number: [id number] Date of Birth/Sex: Treating RN: Sep 09, 1950 (68 y.o. [name] Primary Care Loula Marcella: Lin Landsman Other Clinician: Referring Gautham Hewins: Treating Mycah Formica/Extender:Robson, Fernanda Drum, [name] in Treatment: 0 Clinic Level of Care Assessment Items TOOL 1 Quantity Score X - Use when EandM and Procedure is performed on INITIAL visit 1 0 ASSESSMENTS - Nursing Assessment / Reassessment X - General Physical Exam (combine w/ comprehensive assessment (listed just below) 1 20 when performed on new pt. evals) X - Comprehensive Assessment (HX, ROS, Risk Assessments, Wounds Hx, etc.) 1 25 ASSESSMENTS - Wound and Skin Assessment / Reassessment []  - Dermatologic / Skin Assessment (not related to wound area) 0 ASSESSMENTS - Ostomy and/or Continence Assessment and Care []  - Incontinence Assessment and Management 0 []  - Ostomy Care Assessment and Management (repouching, etc.) 0 PROCESS - Coordination of Care X - Simple Patient / Family Education for ongoing care 1 15 []  - Complex (extensive) Patient / Family Education for ongoing care 0 X - Staff obtains Programmer, systems, Records, Test Results / Process Orders 1 10 []  - Staff telephones HHA, Nursing Homes / Clarify orders / etc 0 []  - Routine Transfer to another Facility (non-emergent condition) 0 []  - Routine Hospital Admission (non-emergent condition) 0 X - New Admissions / Biomedical engineer / Ordering NPWT, Apligraf, etc. 1 15 []  - Emergency Hospital Admission (emergent condition) 0 PROCESS - Special Needs []  - Pediatric / Minor Patient Management 0 []  - Isolation Patient Management 0 []  - Hearing / Language / Visual  special needs 0 []  - Assessment of Community assistance (transportation, D/C planning, etc.) 0 []  - Additional assistance / Altered mentation 0 []  - Support Surface(s) Assessment (bed, cushion, seat, etc.) 0 INTERVENTIONS - Miscellaneous []  - External ear exam 0 []  - Patient Transfer (multiple staff / Civil Service fast streamer / Similar devices) 0 []  -  Simple Staple / Suture removal (25 or less) 0 []  - Complex Staple / Suture removal (26 or more) 0 []  - Hypo/Hyperglycemic Management (do not check if billed separately) 0 []  - Ankle / Brachial Index (ABI) - do not check if billed separately 0 Has the patient been seen at the hospital within the last three years: Yes Total Score: 85 Level Of Care: New/Established - Level 3 Electronic Signature(s) Signed: 01/04/2019 5:20:59 [name] By: Kela Millin Entered By: Kela Millin on 01/04/2019 16:14:43 -------------------------------------------------------------------------------- Encounter Discharge Information Details Patient Name: Date of Service: Larry [name]. 01/04/2019 3:00 [name] Medical Record [id number] Patient Account Number: [id number] Date of Birth/Sex: Treating RN: February 28, 1950 (68 y.o. [name] Primary Care Brindley Madarang: Lin Landsman Other Clinician: Referring Sequoya Hogsett: Treating Yohance Hathorne/Extender:Robson, Fernanda Drum, [name] in Treatment: 0 Encounter Discharge Information Items Discharge Condition: Stable Ambulatory Status: Wheelchair Discharge Destination: Home Transportation: Private Auto Accompanied By: husband Schedule Follow-up Appointment: Yes Clinical Summary of Care: Electronic Signature(s) Signed: 01/04/2019 5:02:36 [name] By: Deon Pilling Entered By: Deon Pilling on 01/04/2019 17:02:07 -------------------------------------------------------------------------------- Multi Wound Chart Details Patient Name: Date of Service: Larry [name]. 01/04/2019 3:00 [name] Medical Record [id number] Patient  Account Number: [id number] Date of Birth/Sex: Treating RN: 31-Jan-1951 (68 y.o. [name] Primary Care Jamill Wetmore: Lin Landsman Other Clinician: Referring Layci Stenglein: Treating Kimerly Rowand/Extender:Robson, Fernanda Drum, [name] in Treatment: 0 Vital Signs Height(in): 71 Pulse(bpm): 99 Weight(lbs): 344 Blood Pressure(mmHg): 146/83 Body Mass Index(BMI): 48 Temperature(F): 98.2 Respiratory 20 Rate(breaths/min): Photos: [8:No Photos] [N/A:N/A] Wound Location: [8:Abdomen - midline] [N/A:N/A] Wounding Event: [8:Surgical Injury] [N/A:N/A] Primary Etiology: [8:Open Surgical Wound] [N/A:N/A] Comorbid History: [8:Asthma, Chronic Obstructive Pulmonary Disease (COPD), Sleep Apnea, Arrhythmia, Congestive Heart Failure, Coronary Artery Disease, Hypertension, Peripheral Arterial Disease, Peripheral Venous Disease] [N/A:N/A] Date Acquired: [8:02/21/2017] [N/A:N/A] Weeks of Treatment: [8:0] [N/A:N/A] Wound Status: [8:Open] [N/A:N/A] Measurements L x W x D 14.5x7x0.1 [N/A:N/A] (cm) Area (cm) : [8:79.718] [N/A:N/A] Volume (cm) : [8:7.972] [N/A:N/A] Classification: [8:Full Thickness Without Exposed Support Structures] [N/A:N/A] Exudate Amount: [8:Medium] [N/A:N/A] Exudate Type: [8:Serosanguineous] [N/A:N/A] Exudate Color: [8:red, brown] [N/A:N/A] Granulation Amount: [8:Large (67-100%)] [N/A:N/A] Granulation Quality: [8:Red] [N/A:N/A] Necrotic Amount: [8:Small (1-33%)] [N/A:N/A] Exposed Structures: [8:Fat Layer (Subcutaneous N/A Tissue) Exposed: Yes Fascia: No Tendon: No Muscle: No Joint: No Bone: No] Epithelialization: [8:None Chemical Cauterization] [N/A:N/A N/A] Treatment Notes Electronic Signature(s) Signed: 01/04/2019 5:20:59 [name] By: Kela Millin Signed: 01/04/2019 5:33:43 [name] By: Linton Ham MD Entered By: Linton Ham on 01/04/2019 16:49:40 -------------------------------------------------------------------------------- Multi-Disciplinary Care Plan Details Patient  Name: Date of Service: Larry [name]. 01/04/2019 3:00 [name] Medical Record [id number] Patient Account Number: [id number] Date of Birth/Sex: Treating RN: 08/04/1950 (68 y.o. [name] Primary Care Margerie Fraiser: Lin Landsman Other Clinician: Referring Charm Stenner: Treating Mandy Fitzwater/Extender:Robson, Fernanda Drum, [name] in Treatment: 0 Active Inactive Wound/Skin Impairment Nursing Diagnoses: Impaired tissue integrity Goals: Ulcer/skin breakdown will have a volume reduction of 30% by week 4 Date Initiated: 01/04/2019 Target Resolution Date: 02/01/2019 Goal Status: Active Interventions: Provide education on ulcer and skin care Notes: Electronic Signature(s) Signed: 01/04/2019 5:20:59 [name] By: Kela Millin Entered By: Kela Millin on 01/04/2019 16:13:08 -------------------------------------------------------------------------------- Pain Assessment Details Patient Name: Date of Service: Larry [name], Larry [name] 01/04/2019 3:00 [name] Medical Record [id number] Patient Account Number: [id number] Date of Birth/Sex: Treating RN: 02-Mar-1950 (67 y.o. Jerilynn Mages) Carlene Coria Primary Care Rosanne Wohlfarth: Lin Landsman Other Clinician: Referring Delaynee Alred: Treating Tamatha Gadbois/Extender:Robson, Fernanda Drum, [name] in Treatment: 0 Active Problems Location of Pain Severity and Description of Pain Patient Has Paino No Site Locations Pain Management and Medication Current  Pain Management: Electronic Signature(s) Signed: 01/29/2019 2:49:42 [name] By: Carlene Coria RN Entered By: Carlene Coria on 01/04/2019 15:23:54 -------------------------------------------------------------------------------- Patient/Caregiver Education Details Larry [name] 11/13/2020andnbsp3:00 Patient Name: Date of Service: Larry [name] Medical Record Patient Account Number: [id number] Number: Treating RN: Kela Millin Date of Birth/Gender: August 01, 1950 (68 y.o. M) Other  Clinician: Primary Care Physician: Sigurd Sos Referring Physician: Physician/Extender: Katharine Look in Treatment: 0 Education Assessment Education Provided To: Patient Education Topics Provided Wound/Skin Impairment: Methods: Explain/Verbal Responses: State content correctly Electronic Signature(s) Signed: 01/04/2019 5:20:59 [name] By: Kela Millin Entered By: Kela Millin on 01/04/2019 16:13:37 -------------------------------------------------------------------------------- Wound Assessment Details Patient Name: Date of Service: Larry [name], Larry [name] 01/04/2019 3:00 [name] Medical Record [id number] Patient Account Number: [id number] Date of Birth/Sex: Treating RN: [birth year] (67 y.o. Jerilynn Mages) Carlene Coria Primary Care Kenda Kloehn: Lin Landsman Other Clinician: Referring Shakenya Stoneberg: Treating Sanders Manninen/Extender:Robson, Fernanda Drum, [name] in Treatment: 0 Wound Status Wound Number: 8 Primary Open Surgical Wound Etiology: Wound Location: Abdomen - midline Wound Open Wounding Event: Surgical Injury Status: Date Acquired: 02/21/2017 Comorbid Asthma, Chronic Obstructive Pulmonary Weeks Of Treatment: 0 History: Disease (COPD), Sleep Apnea, Arrhythmia, Clustered Wound: No Congestive Heart Failure, Coronary Artery Disease, Hypertension, Peripheral Arterial Disease, Peripheral Venous Disease Photos Wound Measurements Length: (cm) 14.5 % Reductio Width: (cm) 7 % Reductio Depth: (cm) 0.1 Epithelial Area: (cm) 79.718 Tunneling Volume: (cm) 7.972 Undermini Wound Description Full Thickness Without Exposed Support Foul Odor Full Thickness Without Exposed Support Foul Odo Classification: Structures Slough/F Exudate Medium Amount: Exudate Serosanguineous Type: Exudate red, brown Color: Wound Bed Granulation Amount: Large (67-100%) Granulation Quality: Red Fascia E Necrotic Amount: Small (1-33%) Fat Laye Necrotic Quality: Adherent  Slough Tendon E Muscle E Joint Ex Bone Exp After Cleansing: No r After Cleansing: No ibrino Yes Exposed Structure xposed: No r (Subcutaneous Tissue) Exposed: Yes xposed: No xposed: No posed: No osed: No n in Area: 0% n in Volume: 0% ization: None : No ng: No Electronic Signature(s) Signed: 01/08/2019 4:00:22 [name] By: Mikeal Hawthorne EMT/HBOT Signed: 01/29/2019 2:49:42 [name] By: Carlene Coria RN Entered By: Mikeal Hawthorne on 01/08/2019 08:53:41 -------------------------------------------------------------------------------- Vitals Details Patient Name: Date of Service: Larry [name]. 01/04/2019 3:00 [name] Medical Record [id number] Patient Account Number: [id number] Date of Birth/Sex: Treating RN: [birth year] (67 y.o. Jerilynn Mages) Carlene Coria Primary Care Illyanna Petillo: Lin Landsman Other Clinician: Referring Damiya Sandefur: Treating Kapena Hamme/Extender:Robson, Fernanda Drum, [name] in Treatment: 0 Vital Signs Time Taken: 15:11 Temperature (F): 98.2 Height (in): 71 Pulse (bpm): 99 Source: Stated Respiratory Rate (breaths/min): 20 Weight (lbs): 344 Blood Pressure (mmHg): 146/83 Source: Stated Reference Range: 80 - 120 mg / dl Body Mass Index (BMI): 48 Electronic Signature(s) Signed: 01/29/2019 2:49:42 [name] By: Carlene Coria RN Entered By: Carlene Coria on 01/04/2019 15:11:45

## 2019-01-29 NOTE — Progress Notes (Signed)
[name], [name] ([id number]) Visit Report for 01/04/2019 Chief Complaint Document Details Patient Name: Date of Service: [name], [name] 01/04/2019 3:00 PM Medical Record [id number] Patient Account Number: [id number] Date of Birth/Sex: Treating RN: [birth year] (68 y.o. Larry [name] Primary Care Provider: Lin Landsman Other Clinician: Referring Provider: Treating Provider/Extender:Robson, Fernanda Drum, [name] in Treatment: 0 Information Obtained from: Patient Chief Complaint spontaneous appearance of large superficial wound lt leg. occurred 2 weeks ago. no pain but a little tender. 09/14/2018; patient is here for review of fiber 6 wounds in the mid abdominal surgical scar 01/04/2019; patient is here for review of wounds in the same midline abdominal scar Electronic Signature(s) Signed: 01/04/2019 5:33:43 PM By: Linton Ham MD Entered By: Linton Ham on 01/04/2019 16:50:19 -------------------------------------------------------------------------------- HPI Details Patient Name: Date of Service: Larry [name]. 01/04/2019 3:00 PM Medical Record [id number] Patient Account Number: [id number] Date of Birth/Sex: Treating RN: May 19, 1950 (68 y.o. Larry [name] Primary Care Provider: Lin Landsman Other Clinician: Referring Provider: Treating Provider/Extender:Robson, Fernanda Drum, [name] in Treatment: 0 History of Present Illness Location: Patient presents with a wound to left lower leg. Quality: Patient reports No Pain. Severity: Patient states wound(s) are getting worse. Duration: Patient has had the wound for < 2 weeks prior to presenting for treatment Modifying Factors: pvd HPI Description: pt smoked until 5 years ago. cancer surgery 1 year ago. has ruq stoma. cv studies last year revealed non-compressible vessels. recent pe by pcp yielded some heart abnormality and he is scheduled for stress test. 5/24 2 new wounds on  rt leg. left side healed 08/12/14; several small wounds on the right lateral leg and one wound on the left leg which is new. The edema control here does not look adequate. There is also some degree of maceration around the wounds on the right lateral leg READMISSION 09/14/2018 This is a patient we had in this clinic in 2013 and then again in 2016 with wounds on his right lateral leg secondary to chronic venous insufficiency. He was cared for I believe in 2016 by Dr. Lindon Romp. The patient is here for review of 6 small wounds within a large surgical area on his abdomen. The patient had surgery for a malignant neoplasm of the sigmoid colon with a colostomy. I cannot see the date of the exact surgery in epic. In February 2018 he had an attempted colostomy reversal however he had a microperforation I believe at the anastomosis. He required a repeat laparotomy. He had an ileostomy raised at that time. I believe the surgical wound at that time was left to close via secondary intention. The patient is unaware whether he had a wound VAC on this. In any case this almost is completely closed except he has been left with 6 small eraser head sized areas on the abdomen that have not healed. I am not exactly sure how he was how he is been dressing this. He has been followed with frequent visits in general surgery dating back at least over a year. Past medical history includes chronic systolic heart failure, stage III chronic renal failure. Malignant neoplasm of the sigmoid colon, morbid obesity. 7/31; not a lot of change 6 small wounds within a large surgical area on his abdomen. None of these are hyper granulated as opposed to last week. None of them have exposed mesh or surrounding infection. He has been using [facility] 8/14- He comes after 2 weeks, he has only 2 open wounds that I can tell this time, he  has been using Hydrofera Blue 8/28; 2-week follow-up. Very superficial areas on the abdominal surgical  site scar. He is using Hydrofera Blue and this seems to be doing a good job. He has not made it down to the medical supply store to see if he can get an abdominal binder or something to keep the tension off this wound area. READMISSION 01/04/2019 This is a 68 year old man we had in the clinic from July through August 2020. He had wounds in the midline abdominal scar. [Please see history from my note of 09/14/2018}. When he was here last time we used Hydrofera Blue. Things seem to be improving. He did not discharge in a healed state his last visit here was on 8/28. He states he continued to use the [facility] and at 1 point this was very close to healing but more recently it is reopened. The wounds are basically in the same state as last time superficial friable easily bleeding wounds. Some hyper granulation. He has an abdominal binder that he got at [facility] however he says it is too tight for him to get on. Past medical history is reviewed; he has coronary artery disease, stage III chronic renal failure heart failure with reduced ejection fraction, COPD, hypertension, obstructive sleep apnea. He was hospitalized since he was here last time for a severe pharyngitis. Electronic Signature(s) Signed: 01/04/2019 5:33:43 PM By: Linton Ham MD Entered By: Linton Ham on 01/04/2019 16:54:36 -------------------------------------------------------------------------------- Chemical Cauterization Details Patient Name: Date of Service: Larry, [name] 01/04/2019 3:00 PM Medical Record [id number] Patient Account Number: [id number] Date of Birth/Sex: Treating RN: 1951-02-04 (68 y.o. Larry [name] Primary Care Provider: Lin Landsman Other Clinician: Referring Provider: Treating Provider/Extender:Robson, Fernanda Drum, [name] in Treatment: 0 Procedure Performed for: Wound #8 Abdomen - midline Performed By: Physician Ricard Dillon., MD Post Procedure  Diagnosis Same as Pre-procedure Electronic Signature(s) Signed: 01/04/2019 5:33:43 PM By: Linton Ham MD Entered By: Linton Ham on 01/04/2019 16:49:54 -------------------------------------------------------------------------------- Physical Exam Details Patient Name: Date of Service: SOKHA, WHINERY 01/04/2019 3:00 PM Medical Record [id number] Patient Account Number: [id number] Date of Birth/Sex: Treating RN: 1950-11-27 (68 y.o. Larry [name] Primary Care Provider: Lin Landsman Other Clinician: Referring Provider: Treating Provider/Extender:Robson, Fernanda Drum, [name] in Treatment: 0 Constitutional Patient is hypertensive.. Pulse regular and within target range for patient.[name] Kitchen Respirations regular, non-labored and within target range.. Temperature is normal and within the target range for the patient.[name] Kitchen Appears in no distress. Eyes Conjunctivae clear. No discharge.no icterus. Respiratory work of breathing is normal. Gastrointestinal (GI) Ileostomy in the right upper quadrant. Somewhat distended bowel sounds are positive no masses no tenderness. No liver or spleen enlargement. Genitourinary (GU) Bladder is not distended. Integumentary (Hair, Skin) There is no erythema around the wound. Psychiatric appears at normal baseline. Notes Wound exam; hearing questions in the midline of his epigastric part of his abdomen. Large surgical scar here with several small open areas. Several of these are hyper granulated weeping and friable. I cauterized all of these with silver nitrate. We will continue with Hydrofera Blue there is no evidence of infection Electronic Signature(s) Signed: 01/04/2019 5:33:43 PM By: Linton Ham MD Entered By: Linton Ham on 01/04/2019 16:57:43 -------------------------------------------------------------------------------- Physician Orders Details Patient Name: Date of Service: JASH, JASON 01/04/2019 3:00  PM Medical Record [id number] Patient Account Number: [id number] Date of Birth/Sex: Treating RN: Jul 14, 1950 (68 y.o. Larry [name] Primary Care Provider: Lin Landsman Other Clinician: Referring Provider: Treating Provider/Extender:Robson, Fernanda Drum, [name] in Treatment:  0 Verbal / Phone Orders: No Diagnosis Coding Follow-up Appointments Return Appointment in 1 week. Dressing Change Frequency Change Dressing every other day. Wound Cleansing May shower and wash wound with soap and water. - with dressing change Primary Wound Dressing Hydrofera Blue Secondary Dressing Dry Gauze ABD pad Other: - abdominal binder Electronic Signature(s) Signed: 01/04/2019 5:20:59 PM By: Kela Millin Signed: 01/04/2019 5:33:43 PM By: Linton Ham MD Entered By: Kela Millin on 01/04/2019 16:16:35 -------------------------------------------------------------------------------- Problem List Details Patient Name: Date of Service: Larry [name]. 01/04/2019 3:00 PM Medical Record [id number] Patient Account Number: [id number] Date of Birth/Sex: Treating RN: [birth year] (68 y.o. Larry [name] Primary Care Provider: Lin Landsman Other Clinician: Referring Provider: Treating Provider/Extender:Robson, Fernanda Drum, [name] in Treatment: 0 Active Problems ICD-10 Evaluated Encounter Code Description Active Date Today Diagnosis T81.31XD Disruption of external operation (surgical) wound, not 01/04/2019 No Yes elsewhere classified, subsequent encounter S31.105S Unspecified open wound of abdominal wall, [id number] No Yes periumbilic region without penetration into peritoneal cavity, sequela Inactive Problems Resolved Problems Electronic Signature(s) Signed: 01/04/2019 5:33:43 PM By: Linton Ham MD Entered By: Linton Ham on 01/04/2019 16:49:35 -------------------------------------------------------------------------------- Progress  Note Details Patient Name: Date of Service: Larry [name] 01/04/2019 3:00 PM Medical Record [id number] Patient Account Number: [id number] Date of Birth/Sex: Treating RN: 1950-11-02 (68 y.o. Larry [name] Primary Care Provider: Lin Landsman Other Clinician: Referring Provider: Treating Provider/Extender:Robson, Fernanda Drum, [name] in Treatment: 0 Subjective Chief Complaint Information obtained from Patient spontaneous appearance of large superficial wound lt leg. occurred 2 weeks ago. no pain but a little tender. 09/14/2018; patient is here for review of fiber 6 wounds in the mid abdominal surgical scar 01/04/2019; patient is here for review of wounds in the same midline abdominal scar History of Present Illness (HPI) The following HPI elements were documented for the patient's wound: Location: Patient presents with a wound to left lower leg. Quality: Patient reports No Pain. Severity: Patient states wound(s) are getting worse. Duration: Patient has had the wound for < 2 weeks prior to presenting for treatment Modifying Factors: pvd pt smoked until 5 years ago. cancer surgery 1 year ago. has ruq stoma. cv studies last year revealed non- compressible vessels. recent pe by pcp yielded some heart abnormality and he is scheduled for stress test. 5/24 2 new wounds on rt leg. left side healed 08/12/14; several small wounds on the right lateral leg and one wound on the left leg which is new. The edema control here does not look adequate. There is also some degree of maceration around the wounds on the right lateral leg READMISSION 09/14/2018 This is a patient we had in this clinic in 2013 and then again in 2016 with wounds on his right lateral leg secondary to chronic venous insufficiency. He was cared for I believe in 2016 by Dr. Lindon Romp. The patient is here for review of 6 small wounds within a large surgical area on his abdomen. The patient had surgery for a  malignant neoplasm of the sigmoid colon with a colostomy. I cannot see the date of the exact surgery in epic. In February 2018 he had an attempted colostomy reversal however he had a microperforation I believe at the anastomosis. He required a repeat laparotomy. He had an ileostomy raised at that time. I believe the surgical wound at that time was left to close via secondary intention. The patient is unaware whether he had a wound VAC on this. In any case this almost is completely closed except he has  been left with 6 small eraser head sized areas on the abdomen that have not healed. I am not exactly sure how he was how he is been dressing this. He has been followed with frequent visits in general surgery dating back at least over a year. Past medical history includes chronic systolic heart failure, stage III chronic renal failure. Malignant neoplasm of the sigmoid colon, morbid obesity. 7/31; not a lot of change 6 small wounds within a large surgical area on his abdomen. None of these are hyper granulated as opposed to last week. None of them have exposed mesh or surrounding infection. He has been using [facility] 8/14- He comes after 2 weeks, he has only 2 open wounds that I can tell this time, he has been using Hydrofera Blue 8/28; 2-week follow-up. Very superficial areas on the abdominal surgical site scar. He is using Hydrofera Blue and this seems to be doing a good job. He has not made it down to the medical supply store to see if he can get an abdominal binder or something to keep the tension off this wound area. READMISSION 01/04/2019 This is a 68 year old man we had in the clinic from July through August 2020. He had wounds in the midline abdominal scar. [Please see history from my note of 09/14/2018}. When he was here last time we used Hydrofera Blue. Things seem to be improving. He did not discharge in a healed state his last visit here was on 8/28. He states he continued to use  the [facility] and at 1 point this was very close to healing but more recently it is reopened. The wounds are basically in the same state as last time superficial friable easily bleeding wounds. Some hyper granulation. He has an abdominal binder that he got at [facility] however he says it is too tight for him to get on. Past medical history is reviewed; he has coronary artery disease, stage III chronic renal failure heart failure with reduced ejection fraction, COPD, hypertension, obstructive sleep apnea. He was hospitalized since he was here last time for a severe pharyngitis. Patient History Information obtained from Patient. Allergies No Known Drug Allergies Family History Diabetes - Father, Heart Disease - Father, Hypertension - Father,Mother, No family history of Cancer, Hereditary Spherocytosis, Kidney Disease, Lung Disease, Seizures, Stroke, Thyroid Problems, Tuberculosis. Social History Former smoker - quit 5 years ago, Marital Status - Married, Alcohol Use - Rarely, Drug Use - No History, Caffeine Use - Daily. Medical History Eyes Denies history of Cataracts, Glaucoma, Optic Neuritis Ear/Nose/Mouth/Throat Denies history of Chronic sinus problems/congestion, Middle ear problems Hematologic/Lymphatic Denies history of Anemia, Hemophilia, Human Immunodeficiency Virus, Lymphedema, Sickle Cell Disease Respiratory Patient has history of Asthma, Chronic Obstructive Pulmonary Disease (COPD), Sleep Apnea - uses CPAP Denies history of Aspiration, Pneumothorax, Tuberculosis Cardiovascular Patient has history of Arrhythmia - atrial tachycardia, NSVT, Congestive Heart Failure, Coronary Artery Disease, Hypertension, Peripheral Arterial Disease, Peripheral Venous Disease Denies history of Angina, Deep Vein Thrombosis, Hypotension, Myocardial Infarction, Phlebitis, Vasculitis Gastrointestinal Denies history of Cirrhosis , Colitis, Crohnoos, Hepatitis A, Hepatitis B, Hepatitis  C Endocrine Denies history of Type I Diabetes, Type II Diabetes Genitourinary Denies history of End Stage Renal Disease Immunological Denies history of Lupus Erythematosus, Raynaudoos, Scleroderma Integumentary (Skin) Denies history of History of Burn Musculoskeletal Denies history of Gout, Rheumatoid Arthritis, Osteoarthritis, Osteomyelitis Neurologic Denies history of Dementia, Neuropathy, Quadriplegia, Paraplegia, Seizure Disorder Oncologic Denies history of Received Chemotherapy, Received Radiation Psychiatric Denies history of Anorexia/bulimia, Confinement Anxiety Hospitalization/Surgery  History - colectomy. - cardiac cath with stent placementx2. Medical And Surgical History Notes Constitutional Symptoms (General Health) morbid obesity Cardiovascular hypercholesteremia, cardiac arrest Gastrointestinal colon CA Oncologic Hx colon CA Review of Systems (ROS) Constitutional Symptoms (General Health) Denies complaints or symptoms of Fatigue, Fever, Chills, Marked Weight Change. Eyes Denies complaints or symptoms of Dry Eyes, Vision Changes, Glasses / Contacts. Ear/Nose/Mouth/Throat Denies complaints or symptoms of Chronic sinus problems or rhinitis. Respiratory Denies complaints or symptoms of Chronic or frequent coughs, Shortness of Breath. Cardiovascular Denies complaints or symptoms of Chest pain. Gastrointestinal Denies complaints or symptoms of Frequent diarrhea, Nausea, Vomiting. Endocrine Denies complaints or symptoms of Heat/cold intolerance. Genitourinary Denies complaints or symptoms of Frequent urination. Integumentary (Skin) Complains or has symptoms of Wounds. Musculoskeletal Denies complaints or symptoms of Muscle Pain, Muscle Weakness. Neurologic Denies complaints or symptoms of Numbness/parasthesias. Psychiatric Denies complaints or symptoms of Claustrophobia, Suicidal. Objective Constitutional Patient is hypertensive.. Pulse regular and within  target range for patient.[name] Kitchen Respirations regular, non-labored and within target range.. Temperature is normal and within the target range for the patient.[name] Kitchen Appears in no distress. Vitals Time Taken: 3:11 PM, Height: 71 in, Source: Stated, Weight: 344 lbs, Source: Stated, BMI: 48, Temperature: 98.2 F, Pulse: 99 bpm, Respiratory Rate: 20 breaths/min, Blood Pressure: 146/83 mmHg. Eyes Conjunctivae clear. No discharge.no icterus. Respiratory work of breathing is normal. Gastrointestinal (GI) Ileostomy in the right upper quadrant. Somewhat distended bowel sounds are positive no masses no tenderness. No liver or spleen enlargement. Genitourinary (GU) Bladder is not distended. Psychiatric appears at normal baseline. General Notes: Wound exam; hearing questions in the midline of his epigastric part of his abdomen. Large surgical scar here with several small open areas. Several of these are hyper granulated weeping and friable. I cauterized all of these with silver nitrate. We will continue with Hydrofera Blue there is no evidence of infection Integumentary (Hair, Skin) There is no erythema around the wound. Wound #8 status is Open. Original cause of wound was Surgical Injury. The wound is located on the Abdomen - midline. The wound measures 14.5cm length x 7cm width x 0.1cm depth; 79.718cm^2 area and 7.972cm^3 volume. There is Fat Layer (Subcutaneous Tissue) Exposed exposed. There is no tunneling or undermining noted. There is a medium amount of serosanguineous drainage noted. There is large (67-100%) red granulation within the wound bed. There is a small (1-33%) amount of necrotic tissue within the wound bed including Adherent Slough. Assessment Active Problems ICD-10 Disruption of external operation (surgical) wound, not elsewhere classified, subsequent encounter Unspecified open wound of abdominal wall, periumbilic region without penetration into peritoneal cavity, sequela Procedures Wound  #8 Pre-procedure diagnosis of Wound #8 is an Open Surgical Wound located on the Abdomen - midline . An Chemical Cauterization procedure was performed by Ricard Dillon., MD. Post procedure Diagnosis Wound #8: Same as Pre-Procedure Plan Follow-up Appointments: Return Appointment in 1 week. Dressing Change Frequency: Change Dressing every other day. Wound Cleansing: May shower and wash wound with soap and water. - with dressing change Primary Wound Dressing: Hydrofera Blue Secondary Dressing: Dry Gauze ABD pad Other: - abdominal binder 1. Hydrofera Blue to continue as the primary dressing 2. I cauterized the areas with silver nitrate to knock down the hyper granulation and hopefully to reduce the friability. 3. We are able to place his abdominal binder. He says it is tight I told him it should be tight although he does not need to sleep in this. Electronic Signature(s) Signed: 01/04/2019 5:33:43 PM By: Dellia Nims,  Legrand Como MD Entered By: Linton Ham on 01/04/2019 16:58:48 -------------------------------------------------------------------------------- HxROS Details Patient Name: Date of Service: KYNDEL, GRIEPENTROG 01/04/2019 3:00 PM Medical Record [id number] Patient Account Number: [id number] Date of Birth/Sex: Treating RN: Jan 29, 1951 (67 y.o. Jerilynn Mages) Carlene Coria Primary Care Provider: Lin Landsman Other Clinician: Referring Provider: Treating Provider/Extender:Robson, Fernanda Drum, [name] in Treatment: 0 Information Obtained From Patient Constitutional Symptoms (General Health) Complaints and Symptoms: Negative for: Fatigue; Fever; Chills; Marked Weight Change Medical History: Past Medical History Notes: morbid obesity Eyes Complaints and Symptoms: Negative for: Dry Eyes; Vision Changes; Glasses / Contacts Medical History: Negative for: Cataracts; Glaucoma; Optic Neuritis Ear/Nose/Mouth/Throat Complaints and Symptoms: Negative for: Chronic sinus  problems or rhinitis Medical History: Negative for: Chronic sinus problems/congestion; Middle ear problems Respiratory Complaints and Symptoms: Negative for: Chronic or frequent coughs; Shortness of Breath Medical History: Positive for: Asthma; Chronic Obstructive Pulmonary Disease (COPD); Sleep Apnea - uses CPAP Negative for: Aspiration; Pneumothorax; Tuberculosis Cardiovascular Complaints and Symptoms: Negative for: Chest pain Medical History: Positive for: Arrhythmia - atrial tachycardia, NSVT; Congestive Heart Failure; Coronary Artery Disease; Hypertension; Peripheral Arterial Disease; Peripheral Venous Disease Negative for: Angina; Deep Vein Thrombosis; Hypotension; Myocardial Infarction; Phlebitis; Vasculitis Past Medical History Notes: hypercholesteremia, cardiac arrest Gastrointestinal Complaints and Symptoms: Negative for: Frequent diarrhea; Nausea; Vomiting Medical History: Negative for: Cirrhosis ; Colitis; Crohns; Hepatitis A; Hepatitis B; Hepatitis C Past Medical History Notes: colon CA Endocrine Complaints and Symptoms: Negative for: Heat/cold intolerance Medical History: Negative for: Type I Diabetes; Type II Diabetes Genitourinary Complaints and Symptoms: Negative for: Frequent urination Medical History: Negative for: End Stage Renal Disease Integumentary (Skin) Complaints and Symptoms: Positive for: Wounds Medical History: Negative for: History of Burn Musculoskeletal Complaints and Symptoms: Negative for: Muscle Pain; Muscle Weakness Medical History: Negative for: Gout; Rheumatoid Arthritis; Osteoarthritis; Osteomyelitis Neurologic Complaints and Symptoms: Negative for: Numbness/parasthesias Medical History: Negative for: Dementia; Neuropathy; Quadriplegia; Paraplegia; Seizure Disorder Psychiatric Complaints and Symptoms: Negative for: Claustrophobia; Suicidal Medical History: Negative for: Anorexia/bulimia; Confinement  Anxiety Hematologic/Lymphatic Medical History: Negative for: Anemia; Hemophilia; Human Immunodeficiency Virus; Lymphedema; Sickle Cell Disease Immunological Medical History: Negative for: Lupus Erythematosus; Raynauds; Scleroderma Oncologic Medical History: Negative for: Received Chemotherapy; Received Radiation Past Medical History Notes: Hx colon CA Immunizations Pneumococcal Vaccine: Received Pneumococcal Vaccination: No Implantable Devices None Hospitalization / Surgery History Type of Hospitalization/Surgery colectomy cardiac cath with stent placementx2 Family and Social History Cancer: No; Diabetes: Yes - Father; Heart Disease: Yes - Father; Hereditary Spherocytosis: No; Hypertension: Yes - Father,Mother; Kidney Disease: No; Lung Disease: No; Seizures: No; Stroke: No; Thyroid Problems: No; Tuberculosis: No; Former smoker - quit 5 years ago; Marital Status - Married; Alcohol Use: Rarely; Drug Use: No History; Caffeine Use: Daily; Financial Concerns: No; Food, Clothing or Shelter Needs: No; Support System Lacking: No; Transportation Concerns: No Electronic Signature(s) Signed: 01/04/2019 5:33:43 PM By: Linton Ham MD Signed: 01/29/2019 2:49:42 PM By: Carlene Coria RN Entered By: Carlene Coria on 01/04/2019 15:12:31 -------------------------------------------------------------------------------- SuperBill Details Patient Name: Date of Service: DENARIO, BAIK 01/04/2019 Medical Record [id number] Patient Account Number: [id number] Date of Birth/Sex: Treating RN: 1950/10/27 (68 y.o. Larry [name] Primary Care Provider: Lin Landsman Other Clinician: Referring Provider: Treating Provider/Extender:Robson, Fernanda Drum, [name] in Treatment: 0 Diagnosis Coding ICD-10 Codes Code Description Disruption of external operation (surgical) wound, not elsewhere classified, subsequent T81.31XD encounter Unspecified open wound of abdominal wall,  periumbilic region without penetration into peritoneal S31.105S cavity, sequela Facility Procedures CPT4: Code [id number] Description: 9213 - WOUND CARE VISIT-LEV 3 EST PT Modifier:  25 Quantity: 1 CPT4: [id number] Description: [id number] - CHEM CAUT GRANULATION TISS ICD-10 Diagnosis Description S31.105S Unspecified open wound of abdominal wall, periumbilic regi into peritoneal cavity, sequela Modifier: on without pen Quantity: 1 etration Physician Procedures CPT4: Code [id number] Description: [id number] - WC PHYS LEVEL 4 - EST PT ICD-10 Diagnosis Description T81.31XD Disruption of external operation (surgical) wound, not else subsequent encounter S31.105S Unspecified open wound of abdominal wall, periumbilic regio into peritoneal  cavity, sequela 7250 - WC PHYS CHEM CAUT GRAN TISSUE ICD-10 Diagnosis Description S31.105S Unspecified open wound of abdominal wall, periumbilic regio into peritoneal cavity, sequela Modifier: 25 where classifie n without penet n without penet Quantity: 1 d, ration 1 ration Electronic Signature(s) Signed: 01/04/2019 5:20:59 PM By: Kela Millin Signed: 01/04/2019 5:33:43 PM By: Linton Ham MD Entered By: Kela Millin on 01/04/2019 17:07:15

## 2019-01-29 NOTE — Progress Notes (Signed)
Whidden, Karas L. ([id number]) Visit Report for 01/04/2019 Abuse/Suicide Risk Screen Details Patient Name: Date of Service: Larry [name], Larry [name] 01/04/2019 3:00 PM Medical Record [id number] Patient Account Number: [id number] Date of Birth/Sex: Treating RN: 1951-02-20 (67 y.o. Jerilynn Mages) Carlene Coria Primary Care Eileene Kisling: Lin Landsman Other Clinician: Referring Grey Rakestraw: Treating Steve Gregg/Extender:Robson, Fernanda Drum, [name] in Treatment: 0 Abuse/Suicide Risk Screen Items Answer ABUSE RISK SCREEN: Has anyone close to you tried to hurt or harm you recentlyo No Do you feel uncomfortable with anyone in your familyo No Has anyone forced you do things that you didnt want to doo No Electronic Signature(s) Signed: 01/29/2019 2:49:42 PM By: Carlene Coria RN Entered By: Carlene Coria on 01/04/2019 15:12:37 -------------------------------------------------------------------------------- Activities of Daily Living Details Patient Name: Date of Service: Larry [name], Larry [name] 01/04/2019 3:00 PM Medical Record [id number] Patient Account Number: [id number] Date of Birth/Sex: Treating RN: [birth year] (67 y.o. Jerilynn Mages) Carlene Coria Primary Care Paidyn Mcferran: Lin Landsman Other Clinician: Referring Yuchen Fedor: Treating Larnce Schnackenberg/Extender:Robson, Fernanda Drum, [name] in Treatment: 0 Activities of Daily Living Items Answer Activities of Daily Living (Please select one for each item) Drive Automobile Completely Able Take Medications Completely Able Use Telephone Completely Able Care for Appearance Completely Able Use Toilet Completely Able Bath / Shower Completely Able Dress Self Completely Able Feed Self Completely Able Walk Completely Able Get In / Out Bed Completely Able Housework Completely Able Prepare Meals Completely Able Handle Money Completely Able Shop for Self Completely Able Electronic Signature(s) Signed: 01/29/2019 2:49:42 PM By: Carlene Coria RN Entered By: Carlene Coria on 01/04/2019 15:13:04 -------------------------------------------------------------------------------- Education Screening Details Patient Name: Date of Service: Larry [name] 01/04/2019 3:00 PM Medical Record [id number] Patient Account Number: [id number] Date of Birth/Sex: Treating RN: March 07, 1950 (67 y.o. Jerilynn Mages) Carlene Coria Primary Care Romie Tay: Lin Landsman Other Clinician: Referring Daniele Dillow: Treating Kenichi [name]/Extender:Robson, Fernanda Drum, [name] in Treatment: 0 Primary Learner Assessed: Patient Learning Preferences/Education Level/Primary Language Learning Preference: Explanation Highest Education Level: College or Above Preferred Language: English Cognitive Barrier Language Barrier: No Translator Needed: No Memory Deficit: No Emotional Barrier: No Cultural/Religious Beliefs Affecting Medical Care: No Physical Barrier Impaired Vision: No Impaired Hearing: No Decreased Hand dexterity: No Knowledge/Comprehension Knowledge Level: High Comprehension Level: High Ability to understand written High instructions: Ability to understand verbal High instructions: Motivation Anxiety Level: Calm Cooperation: Cooperative Education Importance: Acknowledges Need Interest in Health Problems: Asks Questions Perception: Coherent Willingness to Engage in Self- High Management Activities: Readiness to Engage in Self- High Management Activities: Electronic Signature(s) Signed: 01/29/2019 2:49:42 PM By: Carlene Coria RN Entered By: Carlene Coria on 01/04/2019 15:13:41 -------------------------------------------------------------------------------- Fall Risk Assessment Details Patient Name: Date of Service: Larry [name] 01/04/2019 3:00 PM Medical Record [id number] Patient Account Number: [id number] Date of Birth/Sex: Treating RN: 1950/02/23 (67 y.o. Jerilynn Mages) Carlene Coria Primary Care Alaura Schippers: Lin Landsman Other Clinician: Referring  Lavinia Mcneely: Treating Santos Hardwick/Extender:Robson, Fernanda Drum, [name] in Treatment: 0 Fall Risk Assessment Items Have you had 2 or more falls in the last 12 monthso 0 No Have you had any fall that resulted in injury in the last 12 monthso 0 No FALLS RISK SCREEN History of falling - immediate or within 3 months 0 No Secondary diagnosis (Do you have 2 or more medical diagnoseso) 0 No Ambulatory aid None/bed rest/wheelchair/nurse 0 No Crutches/cane/walker 0 No Furniture 0 No Intravenous therapy Access/Saline/Heparin Lock 0 No Weak (short steps with or without shuffle, stooped but able to lift head 0 No while walking, may seek support from furniture) Impaired (short steps with shuffle, may have  difficulty arising from chair, 0 No head down, impaired balance) Mental Status Oriented to own ability 0 No Overestimates or forgets limitations 0 No Risk Level: Low Risk Score: 0 Electronic Signature(s) Signed: 01/29/2019 2:49:42 PM By: Carlene Coria RN Entered By: Carlene Coria on 01/04/2019 15:13:50 -------------------------------------------------------------------------------- Nutrition Risk Screening Details Patient Name: Date of Service: Larry [name], Larry [name] 01/04/2019 3:00 PM Medical Record [id number] Patient Account Number: [id number] Date of Birth/Sex: Treating RN: Nov 17, 1950 (67 y.o. Jerilynn Mages) Carlene Coria Primary Care Shakyla Nolley: Lin Landsman Other Clinician: Referring Jezabel Lecker: Treating Vedha Tercero/Extender:Robson, Fernanda Drum, [name] in Treatment: 0 Height (in): 71 Weight (lbs): 344 Body Mass Index (BMI): 48 Nutrition Risk Screening Items Score Screening NUTRITION RISK SCREEN: I have an illness or condition that made me change the kind and/or 0 No amount of food I eat I eat fewer than two meals per day 0 No I eat few fruits and vegetables, or milk products 0 No I have three or more drinks of beer, liquor or wine almost every day 0 No I have tooth or mouth problems  that make it hard for me to eat 0 No I don't always have enough money to buy the food I need 0 No I eat alone most of the time 0 No I take three or more different prescribed or over-the-counter drugs a day 1 Yes 0 No Without wanting to, I have lost or gained 10 pounds in the last six months I am not always physically able to shop, cook and/or feed myself 0 No Nutrition Protocols Good Risk Protocol 0 No interventions needed Moderate Risk Protocol High Risk Proctocol Risk Level: Good Risk Score: 1 Electronic Signature(s) Signed: 01/29/2019 2:49:42 PM By: Carlene Coria RN Entered By: Carlene Coria on 01/04/2019 15:14:03

## 2019-02-07 ENCOUNTER — Encounter (HOSPITAL_BASED_OUTPATIENT_CLINIC_OR_DEPARTMENT_OTHER): Payer: Medicare Other | Admitting: Internal Medicine

## 2019-02-07 ENCOUNTER — Other Ambulatory Visit: Payer: Self-pay

## 2019-02-07 DIAGNOSIS — T8131XD Disruption of external operation (surgical) wound, not elsewhere classified, subsequent encounter: Secondary | ICD-10-CM | POA: Diagnosis not present

## 2019-02-07 NOTE — Progress Notes (Addendum)
Eble, Ralf L. ([id number]) Visit Report for 02/07/2019 Arrival Information Details Patient Name: Date of Service: Larry [name], Larry [name] 02/07/2019 1:30 [name] Medical Record [id number] Patient Account Number: [id number] Date of Birth/Sex: Treating RN: [birth year] (67 y.o. Jerilynn Mages) Carlene Coria Primary Care Jlen Wintle: Lin Landsman Other Clinician: Referring Keean Wilmeth: Treating Darla Mcdonald/Extender:Robson, Fernanda Drum, [name] in Treatment: 4 Visit Information History Since Last Visit All ordered tests and consults were completed: No Patient Arrived: Ambulatory Added or deleted any medications: No Arrival Time: 13:07 Any new allergies or adverse reactions: No Accompanied By: self Had a fall or experienced change in No Transfer Assistance: None activities of daily living that may affect Patient Identification Verified: Yes risk of falls: Secondary Verification Process Completed: Yes Signs or symptoms of abuse/neglect since last No Patient Requires Transmission-Based No visito Precautions: Hospitalized since last visit: No Patient Has Alerts: No Implantable device outside of the clinic excluding No cellular tissue based products placed in the center since last visit: Has Dressing in Place as Prescribed: Yes Pain Present Now: No Electronic Signature(s) Signed: 02/07/2019 4:51:50 [name] By: Carlene Coria RN Entered By: Carlene Coria on 02/07/2019 13:14:31 -------------------------------------------------------------------------------- Clinic Level of Care Assessment Details Patient Name: Date of Service: Larry [name] 02/07/2019 1:30 [name] Medical Record [id number] Patient Account Number: [id number] Date of Birth/Sex: Treating RN: November 16, 1950 (68 y.o. [name] Primary Care Kinnie Kaupp: Lin Landsman Other Clinician: Referring Lenzi Marmo: Treating Lauris Serviss/Extender:Robson, Fernanda Drum, [name] in Treatment: 4 Clinic Level of Care Assessment Items TOOL 4  Quantity Score X - Use when only an EandM is performed on FOLLOW-UP visit 1 0 ASSESSMENTS - Nursing Assessment / Reassessment X - Reassessment of Co-morbidities (includes updates in patient status) 1 10 X - Reassessment of Adherence to Treatment Plan 1 5 ASSESSMENTS - Wound and Skin Assessment / Reassessment X - Simple Wound Assessment / Reassessment - one wound 1 5 []  - Complex Wound Assessment / Reassessment - multiple wounds 0 X - Dermatologic / Skin Assessment (not related to wound area) 1 10 ASSESSMENTS - Focused Assessment []  - Circumferential Edema Measurements - multi extremities 0 X - Nutritional Assessment / Counseling / Intervention 1 10 []  - Lower Extremity Assessment (monofilament, tuning fork, pulses) 0 []  - Peripheral Arterial Disease Assessment (using hand held doppler) 0 ASSESSMENTS - Ostomy and/or Continence Assessment and Care []  - Incontinence Assessment and Management 0 []  - Ostomy Care Assessment and Management (repouching, etc.) 0 PROCESS - Coordination of Care X - Simple Patient / Family Education for ongoing care 1 15 []  - Complex (extensive) Patient / Family Education for ongoing care 0 X - Staff obtains Programmer, systems, Records, Test Results / Process Orders 1 10 []  - Staff telephones HHA, Nursing Homes / Clarify orders / etc 0 []  - Routine Transfer to another Facility (non-emergent condition) 0 []  - Routine Hospital Admission (non-emergent condition) 0 []  - New Admissions / Biomedical engineer / Ordering NPWT, Apligraf, etc. 0 []  - Emergency Hospital Admission (emergent condition) 0 X - Simple Discharge Coordination 1 10 []  - Complex (extensive) Discharge Coordination 0 PROCESS - Special Needs []  - Pediatric / Minor Patient Management 0 []  - Isolation Patient Management 0 []  - Hearing / Language / Visual special needs 0 []  - Assessment of Community assistance (transportation, D/C planning, etc.) 0 []  - Additional assistance / Altered mentation 0 []  -  Support Surface(s) Assessment (bed, cushion, seat, etc.) 0 INTERVENTIONS - Wound Cleansing / Measurement X - Simple Wound Cleansing - one wound 1 5 []  - Complex Wound  Cleansing - multiple wounds 0 X - Wound Imaging (photographs - any number of wounds) 1 5 []  - Wound Tracing (instead of photographs) 0 X - Simple Wound Measurement - one wound 1 5 []  - Complex Wound Measurement - multiple wounds 0 INTERVENTIONS - Wound Dressings []  - Small Wound Dressing one or multiple wounds 0 X - Medium Wound Dressing one or multiple wounds 1 15 []  - Large Wound Dressing one or multiple wounds 0 []  - Application of Medications - topical 0 []  - Application of Medications - injection 0 INTERVENTIONS - Miscellaneous []  - External ear exam 0 []  - Specimen Collection (cultures, biopsies, blood, body fluids, etc.) 0 []  - Specimen(s) / Culture(s) sent or taken to Lab for analysis 0 []  - Patient Transfer (multiple staff / Civil Service fast streamer / Similar devices) 0 []  - Simple Staple / Suture removal (25 or less) 0 []  - Complex Staple / Suture removal (26 or more) 0 []  - Hypo / Hyperglycemic Management (close monitor of Blood Glucose) 0 []  - Ankle / Brachial Index (ABI) - do not check if billed separately 0 X - Vital Signs 1 5 Has the patient been seen at the hospital within the last three years: Yes Total Score: 110 Level Of Care: New/Established - Level 3 Electronic Signature(s) Signed: 02/07/2019 5:34:18 [name] By: Deon Pilling Entered By: Deon Pilling on 02/07/2019 13:37:44 -------------------------------------------------------------------------------- Encounter Discharge Information Details Patient Name: Date of Service: Larry [name] 02/07/2019 1:30 [name] Medical Record [id number] Patient Account Number: [id number] Date of Birth/Sex: Treating RN: [birth year] (67 y.o. Jerilynn Mages) Carlene Coria Primary Care Krishawna Stiefel: Lin Landsman Other Clinician: Referring Jeanean Hollett: Treating Maleigh Bagot/Extender:Robson,  Fernanda Drum, [name] in Treatment: 4 Encounter Discharge Information Items Discharge Condition: Stable Ambulatory Status: Cane Discharge Destination: Home Transportation: Private Auto Accompanied By: self Schedule Follow-up Appointment: Yes Clinical Summary of Care: Patient Declined Electronic Signature(s) Signed: 02/07/2019 4:51:50 [name] By: Carlene Coria RN Entered By: Carlene Coria on 02/07/2019 13:49:02 -------------------------------------------------------------------------------- Multi Wound Chart Details Patient Name: Date of Service: Larry [name] 02/07/2019 1:30 [name] Medical Record [id number] Patient Account Number: [id number] Date of Birth/Sex: Treating RN: 1950/04/29 (68 y.o. M) Primary Care Laurena Valko: Lin Landsman Other Clinician: Referring Joon Pohle: Treating Avyukth Bontempo/Extender:Robson, Fernanda Drum, [name] in Treatment: 4 Vital Signs Height(in): 71 Pulse(bpm): 88 Weight(lbs): 344 Blood Pressure(mmHg): 136/85 Body Mass Index(BMI): 48 Temperature(F): 98.2 Respiratory 18 Rate(breaths/min): Photos: [8:No Photos] [N/A:N/A] Wound Location: [8:Abdomen - midline] [N/A:N/A] Wounding Event: [8:Surgical Injury] [N/A:N/A] Primary Etiology: [8:Open Surgical Wound] [N/A:N/A] Comorbid History: [8:Asthma, Chronic Obstructive Pulmonary Disease (COPD), Sleep Apnea, Arrhythmia, Congestive Heart Failure, Coronary Artery Disease, Hypertension, Peripheral Arterial Disease, Peripheral Venous Disease] [N/A:N/A] Date Acquired: [8:02/21/2017] [N/A:N/A] Weeks of Treatment: [8:4] [N/A:N/A] Wound Status: [8:Open] [N/A:N/A] Measurements L x W x D 3x2.2x0.1 [N/A:N/A] (cm) Area (cm) : [8:5.184] [N/A:N/A] Volume (cm) : [8:0.518] [N/A:N/A] % Reduction in Area: [8:93.50%] [N/A:N/A] % Reduction in Volume: 93.50% [N/A:N/A] Classification: [8:Full Thickness Without Exposed Support Structures] [N/A:N/A] Exudate Amount: [8:Medium] [N/A:N/A] Exudate Type:  [8:Serosanguineous] [N/A:N/A] Exudate Color: [8:red, brown] [N/A:N/A] Wound Margin: [8:Flat and Intact] [N/A:N/A] Granulation Amount: [8:Medium (34-66%)] [N/A:N/A] Granulation Quality: [8:Red, Friable] [N/A:N/A] Necrotic Amount: [8:Medium (34-66%)] [N/A:N/A] Exposed Structures: [8:Fat Layer (Subcutaneous N/A Tissue) Exposed: Yes Fascia: No Tendon: No Muscle: No Joint: No Bone: No Small (1-33%)] [N/A:N/A] Treatment Notes Wound #8 (Abdomen - midline) 1. Cleanse With Wound Cleanser 3. Primary Dressing Applied Collegen AG Hydrogel or K-Y Jelly 4. Secondary Dressing ABD Pad 5. Secured With Recruitment consultant) Signed: 02/07/2019 5:01:00 [name] By: Linton Ham  MD Entered By: Linton Ham on 02/07/2019 13:57:16 -------------------------------------------------------------------------------- Multi-Disciplinary Care Plan Details Patient Name: Date of Service: Larry [name], Larry [name] 02/07/2019 1:30 [name] Medical Record [id number] Patient Account Number: [id number] Date of Birth/Sex: Treating RN: [birth year] (68 y.o. [name] Primary Care Gwendalyn Mcgonagle: Lin Landsman Other Clinician: Referring Kieu Quiggle: Treating Dejia Ebron/Extender:Robson, Fernanda Drum, [name] in Treatment: 4 Active Inactive Wound/Skin Impairment Nursing Diagnoses: Impaired tissue integrity Goals: Ulcer/skin breakdown will have a volume reduction of 30% by week 4 Date Initiated: 01/04/2019 Target Resolution Date: 03/22/2019 Goal Status: Active Interventions: Provide education on ulcer and skin care Notes: Electronic Signature(s) Signed: 02/07/2019 5:34:18 [name] By: Deon Pilling Entered By: Deon Pilling on 02/07/2019 13:37:08 -------------------------------------------------------------------------------- Pain Assessment Details Patient Name: Date of Service: Larry [name], Larry [name] 02/07/2019 1:30 [name] Medical Record [id number] Patient Account Number: [id number] Date of  Birth/Sex: Treating RN: Oct 03, 1950 (67 y.o. Jerilynn Mages) Carlene Coria Primary Care Tamalyn Wadsworth: Lin Landsman Other Clinician: Referring Antonisha Waskey: Treating Oriah Leinweber/Extender:Robson, Fernanda Drum, [name] in Treatment: 4 Active Problems Location of Pain Severity and Description of Pain Patient Has Paino No Site Locations Pain Management and Medication Current Pain Management: Electronic Signature(s) Signed: 02/07/2019 4:51:50 [name] By: Carlene Coria RN Entered By: Carlene Coria on 02/07/2019 13:15:17 -------------------------------------------------------------------------------- Patient/Caregiver Education Details Larry [name] 12/17/2020andnbsp1:30 Patient Name: Date of Service: Larry [name] Medical Record Patient Account Number: [id number] Number: Treating RN: Deon Pilling Date of Birth/Gender: 04-15-1950 (67 y.o. M) Other Clinician: Primary Care Physician: Sigurd Sos Referring Physician: Physician/Extender: Katharine Look in Treatment: 4 Education Assessment Education Provided To: Patient Education Topics Provided Wound/Skin Impairment: Handouts: Skin Care Do's and Dont's Methods: Explain/Verbal Responses: Reinforcements needed Electronic Signature(s) Signed: 02/07/2019 5:34:18 [name] By: Deon Pilling Entered By: Deon Pilling on 02/07/2019 13:37:19 -------------------------------------------------------------------------------- Wound Assessment Details Patient Name: Date of Service: Larry [name], Larry [name] 02/07/2019 1:30 [name] Medical Record [id number] Patient Account Number: [id number] Date of Birth/Sex: Treating RN: 01/19/1951 (67 y.o. Jerilynn Mages) Carlene Coria Primary Care Catheleen Langhorne: Lin Landsman Other Clinician: Referring Sulamita Lafountain: Treating Shahiem Bedwell/Extender:Robson, Fernanda Drum, [name] in Treatment: 4 Wound Status Wound Number: 8 Primary Open Surgical Wound Etiology: Wound Location: Abdomen - midline Wound Open Wounding  Event: Surgical Injury Status: Date Acquired: 02/21/2017 Comorbid Asthma, Chronic Obstructive Pulmonary Weeks Of Treatment: 4 History: Disease (COPD), Sleep Apnea, Arrhythmia, Clustered Wound: No Congestive Heart Failure, Coronary Artery Disease, Hypertension, Peripheral Arterial Disease, Peripheral Venous Disease Photos Wound Measurements Length: (cm) 3 % Reduct Width: (cm) 2.2 % Reduct Depth: (cm) 0.1 Epitheli Area: (cm) 5.184 Tunneli Volume: (cm) 0.518 Undermi Wound Description Classification: Full Thickness Without Exposed Support Foul Odo Structures Slough/F Wound Flat and Intact Margin: Exudate Medium Amount: Exudate Serosanguineous Type: Exudate red, brown Color: Wound Bed Granulation Amount: Medium (34-66%) Granulation Quality: Red, Friable Fascia E Necrotic Amount: Medium (34-66%) Fat Laye Necrotic Quality: Adherent Slough Tendon E Muscle Ex Joint Exp Bone Expo r After Cleansing: No ibrino Yes Exposed Structure xposed: No r (Subcutaneous Tissue) Exposed: Yes xposed: No posed: No osed: No sed: No ion in Area: 93.5% ion in Volume: 93.5% alization: Small (1-33%) ng: No ning: No Treatment Notes Wound #8 (Abdomen - midline) 1. Cleanse With Wound Cleanser 3. Primary Dressing Applied Collegen AG Hydrogel or K-Y Jelly 4. Secondary Dressing ABD Pad 5. Secured With Recruitment consultant) Signed: 02/11/2019 6:06:17 [name] By: Mikeal Hawthorne EMT/HBOT Signed: 02/13/2019 11:01:46 AM By: Carlene Coria RN Previous Signature: 02/07/2019 4:51:50 [name] Version By: Carlene Coria RN Entered By: Mikeal Hawthorne on 02/11/2019 17:33:19 -------------------------------------------------------------------------------- Vitals Details Patient Name: Date of  Service: Larry [name], Larry [name] 02/07/2019 1:30 [name] Medical Record [id number] Patient Account Number: [id number] Date of Birth/Sex: Treating RN: Mar 30, 1950 (67 y.o. Jerilynn Mages) Carlene Coria Primary Care Shawnte Winton:  Lin Landsman Other Clinician: Referring Randalyn Ahmed: Treating Avis Mcmahill/Extender:Robson, Fernanda Drum, [name] in Treatment: 4 Vital Signs Time Taken: 13:14 Temperature (F): 98.2 Height (in): 71 Pulse (bpm): 88 Weight (lbs): 344 Respiratory Rate (breaths/min): 18 Body Mass Index (BMI): 48 Blood Pressure (mmHg): 136/85 Reference Range: 80 - 120 mg / dl Electronic Signature(s) Signed: 02/07/2019 4:51:50 [name] By: Carlene Coria RN Entered By: Carlene Coria on 02/07/2019 13:15:10

## 2019-02-07 NOTE — Progress Notes (Signed)
[name], [name] ([id number]) Visit Report for 02/07/2019 HPI Details Patient Name: Date of Service: Larry [name], Larry [name] 02/07/2019 1:30 PM Medical Record [id number] Patient Account Number: [id number] Date of Birth/Sex: Treating RN: [birth year] (68 y.o. M) Primary Care Provider: Lin [name] Other Clinician: Referring Provider: Treating Provider/Extender:Larry [name], Larry [name], Larry [name] History of Present Illness Location: Patient presents with a wound to left lower leg. Quality: Patient reports No Pain. Severity: Patient states wound(s) are getting worse. Duration: Patient has had the wound for < 2 weeks prior to presenting for treatment Modifying Factors: pvd HPI Description: pt smoked until 5 years ago. cancer surgery 1 year ago. has ruq stoma. cv studies last year revealed non-compressible vessels. recent pe by pcp yielded some heart abnormality and he is scheduled for stress test. 5/24 2 new wounds on rt leg. left side healed 08/12/14; several small wounds on the right lateral leg and one wound on the left leg which is new. The edema control here does not look adequate. There is also some degree of maceration around the wounds on the right lateral leg READMISSION 09/14/2018 This is a patient we had in this clinic in 2013 and then again in 2016 with wounds on his right lateral leg secondary to chronic venous insufficiency. He was cared for I believe in 2016 by Dr. Lindon Romp. The patient is here for review of 6 small wounds within a large surgical area on his abdomen. The patient had surgery for a malignant neoplasm of the sigmoid colon with a colostomy. I cannot see the date of the exact surgery in epic. In February 2018 he had an attempted colostomy reversal however he had a microperforation I believe at the anastomosis. He required a repeat laparotomy. He had an ileostomy raised at that time. I believe the surgical wound at that time was left to close via  secondary intention. The patient is unaware whether he had a wound VAC on this. In any case this almost is completely closed except he has been left with 6 small eraser head sized areas on the abdomen that have not healed. I am not exactly sure how he was how he is been dressing this. He has been followed with frequent visits in general surgery dating back at least over a year. Past medical history includes chronic systolic heart failure, stage III chronic renal failure. Malignant neoplasm of the sigmoid colon, morbid obesity. 7/31; not a lot of change 6 small wounds within a large surgical area on his abdomen. None of these are hyper granulated as opposed to last week. None of them have exposed mesh or surrounding infection. He has been using [facility] 8/14- He comes after 2 weeks, he has only 2 open wounds that I can tell this time, he has been using Hydrofera Blue 8/28; 2-week follow-up. Very superficial areas on the abdominal surgical site scar. He is using Hydrofera Blue and this seems to be doing a good job. He has not made it down to the medical supply store to see if he can get an abdominal binder or something to keep the tension off this wound area. READMISSION 01/04/2019 This is a 68 year old man we had in the clinic from July through August 2020. He had wounds in the midline abdominal scar. [Please see history from my note of 09/14/2018}. When he was here last time we used Hydrofera Blue. Things seem to be improving. He did not discharge in a healed state his last visit here was on 8/28. He  states he continued to use the [facility] and at 1 point this was very close to healing but more recently it is reopened. The wounds are basically in the same state as last time superficial friable easily bleeding wounds. Some hyper granulation. He has an abdominal binder that he got at [facility] however he says it is too tight for him to get on. Past medical history is reviewed; he  has coronary artery disease, stage III chronic renal failure heart failure with reduced ejection fraction, COPD, hypertension, obstructive sleep apnea. He was hospitalized since he was here last time for a severe pharyngitis. 11/19; the patient was in the ER on 11/16; this was for oozing from his abdominal wound. He was cauterized with quick clot gauze and epi and silver nitrate. This was felt to be venous oozing. He has very friable wounds in this area. He is using his abdominal binder. 12/3; the patient was in the ER with bleeding from the lower part of his incision in the abdomen on 11/28. Had a stitch placed to contain the bleeding area. This was just underneath his major wound. He saw general surgery yesterday and apparently we are to remove the stitch in 2 weeks. The patient is on Plavix and aspirin for coronary artery disease with stents. His bleeding was felt to be capillary. 12/17; I removed the stitch from the bleeding area that was placed last time. This came out with some difficulty. The major area almost looks like slough with an area of cellophane over it. I did not attempt any further debridement Electronic Signature(s) Signed: 02/07/2019 5:01:00 PM By: Larry Ham MD Entered By: Larry [name] on 02/07/2019 13:57:57 -------------------------------------------------------------------------------- Physical Exam Details Patient Name: Date of Service: Larry [name], Larry [name] 02/07/2019 1:30 PM Medical Record [id number] Patient Account Number: [id number] Date of Birth/Sex: Treating RN: 1950-06-17 (68 y.o. M) Primary Care Provider: Lin [name] Other Clinician: Referring Provider: Treating Provider/Extender:Larry [name], Larry [name], Larry [name] Constitutional Sitting or standing Blood Pressure is within target range for patient.. Pulse regular and within target range for patient.[name] Kitchen Respirations regular, non-labored and within target range.. Temperature is  normal and within the target range for the patient.[name] Kitchen Appears in no distress. Respiratory work of breathing is normal. Gastrointestinal (GI) Abdomen is soft nontender.. Integumentary (Hair, Skin) No erythema around the wound. Notes Wound exam; area in question is in the mid part of his abdominal incision. This has the appearance of slough which is covered with cellophane. I did not attempt to debride this. Electronic Signature(s) Signed: 02/07/2019 5:01:00 PM By: Larry Ham MD Entered By: Larry [name] on 02/07/2019 14:00:14 -------------------------------------------------------------------------------- Physician Orders Details Patient Name: Date of Service: Larry [name], Larry [name] 02/07/2019 1:30 PM Medical Record [id number] Patient Account Number: [id number] Date of Birth/Sex: Treating RN: 06-07-1950 (68 y.o. Larry [name] Primary Care Provider: Lin [name] Other Clinician: Referring Provider: Treating Provider/Extender:Anaria Kroner, Larry [name], Larry [name] Verbal / Phone Orders: No Diagnosis Coding ICD-10 Coding Code Description Disruption of external operation (surgical) wound, not elsewhere classified, subsequent T81.31XD encounter Unspecified open wound of abdominal wall, periumbilic region without penetration into peritoneal S31.105S cavity, sequela Follow-up Appointments Return appointment in 3 weeks. Dressing Change Frequency Change Dressing every other day. Wound Cleansing May shower and wash wound with soap and water. - with dressing change Primary Wound Dressing Wound #8 Abdomen - midline Silver Collagen - moisten with hydrogel. Secondary Dressing Dry Gauze ABD pad Other: - abdominal binder while up during the day. may remove at  night. Electronic Signature(s) Signed: 02/07/2019 5:01:00 PM By: Larry Ham MD Signed: 02/07/2019 5:34:18 PM By: Deon Pilling Entered By: Deon Pilling on 02/07/2019  13:36:42 -------------------------------------------------------------------------------- Problem List Details Patient Name: Date of Service: Larry [name], Larry [name] 02/07/2019 1:30 PM Medical Record [id number] Patient Account Number: [id number] Date of Birth/Sex: Treating RN: Dec 16, 1950 (68 y.o. Larry [name] Primary Care Provider: Other Clinician: Lin [name] Referring Provider: Treating Provider/Extender:Okey Zelek, Larry [name], Larry [name] Active Problems ICD-10 Evaluated Encounter Code Description Active Date Today Diagnosis T81.31XD Disruption of external operation (surgical) wound, not 01/04/2019 No Yes elsewhere classified, subsequent encounter S31.105S Unspecified open wound of abdominal wall, [id number] No Yes periumbilic region without penetration into peritoneal cavity, sequela Inactive Problems Resolved Problems Electronic Signature(s) Signed: 02/07/2019 5:01:00 PM By: Larry Ham MD Entered By: Larry [name] on 02/07/2019 13:57:12 -------------------------------------------------------------------------------- Progress Note Details Patient Name: Date of Service: Larry [name] 02/07/2019 1:30 PM Medical Record [id number] Patient Account Number: [id number] Date of Birth/Sex: Treating RN: May 10, 1950 (68 y.o. M) Primary Care Provider: Lin [name] Other Clinician: Referring Provider: Treating Provider/Extender:Lenee Franze, Larry [name], Larry [name] Subjective History of Present Illness (HPI) The following HPI elements were documented for the patient's wound: Location: Patient presents with a wound to left lower leg. Quality: Patient reports No Pain. Severity: Patient states wound(s) are getting worse. Duration: Patient has had the wound for < 2 weeks prior to presenting for treatment Modifying Factors: pvd pt smoked until 5 years ago. cancer surgery 1 year ago. has ruq stoma. cv studies last year  revealed non- compressible vessels. recent pe by pcp yielded some heart abnormality and he is scheduled for stress test. 5/24 2 new wounds on rt leg. left side healed 08/12/14; several small wounds on the right lateral leg and one wound on the left leg which is new. The edema control here does not look adequate. There is also some degree of maceration around the wounds on the right lateral leg READMISSION 09/14/2018 This is a patient we had in this clinic in 2013 and then again in 2016 with wounds on his right lateral leg secondary to chronic venous insufficiency. He was cared for I believe in 2016 by Dr. Lindon Romp. The patient is here for review of 6 small wounds within a large surgical area on his abdomen. The patient had surgery for a malignant neoplasm of the sigmoid colon with a colostomy. I cannot see the date of the exact surgery in epic. In February 2018 he had an attempted colostomy reversal however he had a microperforation I believe at the anastomosis. He required a repeat laparotomy. He had an ileostomy raised at that time. I believe the surgical wound at that time was left to close via secondary intention. The patient is unaware whether he had a wound VAC on this. In any case this almost is completely closed except he has been left with 6 small eraser head sized areas on the abdomen that have not healed. I am not exactly sure how he was how he is been dressing this. He has been followed with frequent visits in general surgery dating back at least over a year. Past medical history includes chronic systolic heart failure, stage III chronic renal failure. Malignant neoplasm of the sigmoid colon, morbid obesity. 7/31; not a lot of change 6 small wounds within a large surgical area on his abdomen. None of these are hyper granulated as opposed to last week. None of them have exposed mesh or surrounding infection. He has been  using Hydrofera Blue 8/14- He comes after 2 weeks, he has only 2 open  wounds that I can tell this time, he has been using Hydrofera Blue 8/28; 2-week follow-up. Very superficial areas on the abdominal surgical site scar. He is using Hydrofera Blue and this seems to be doing a good job. He has not made it down to the medical supply store to see if he can get an abdominal binder or something to keep the tension off this wound area. READMISSION 01/04/2019 This is a 68 year old man we had in the clinic from July through August 2020. He had wounds in the midline abdominal scar. [Please see history from my note of 09/14/2018}. When he was here last time we used Hydrofera Blue. Things seem to be improving. He did not discharge in a healed state his last visit here was on 8/28. He states he continued to use the [facility] and at 1 point this was very close to healing but more recently it is reopened. The wounds are basically in the same state as last time superficial friable easily bleeding wounds. Some hyper granulation. He has an abdominal binder that he got at [facility] however he says it is too tight for him to get on. Past medical history is reviewed; he has coronary artery disease, stage III chronic renal failure heart failure with reduced ejection fraction, COPD, hypertension, obstructive sleep apnea. He was hospitalized since he was here last time for a severe pharyngitis. 11/19; the patient was in the ER on 11/16; this was for oozing from his abdominal wound. He was cauterized with quick clot gauze and epi and silver nitrate. This was felt to be venous oozing. He has very friable wounds in this area. He is using his abdominal binder. 12/3; the patient was in the ER with bleeding from the lower part of his incision in the abdomen on 11/28. Had a stitch placed to contain the bleeding area. This was just underneath his major wound. He saw general surgery yesterday and apparently we are to remove the stitch in 2 weeks. The patient is on Plavix and aspirin for  coronary artery disease with stents. His bleeding was felt to be capillary. 12/17; I removed the stitch from the bleeding area that was placed last time. This came out with some difficulty. The major area almost looks like slough with an area of cellophane over it. I did not attempt any further debridement Objective Constitutional Sitting or standing Blood Pressure is within target range for patient.. Pulse regular and within target range for patient.[name] Kitchen Respirations regular, non-labored and within target range.. Temperature is normal and within the target range for the patient.[name] Kitchen Appears in no distress. Vitals Time Taken: 1:14 PM, Height: 71 in, Weight: 344 lbs, BMI: 48, Temperature: 98.2 F, Pulse: 88 bpm, Respiratory Rate: 18 breaths/min, Blood Pressure: 136/85 mmHg. Respiratory work of breathing is normal. Gastrointestinal (GI) Abdomen is soft nontender.. General Notes: Wound exam; area in question is in the mid part of his abdominal incision. This has the appearance of slough which is covered with cellophane. I did not attempt to debride this. Integumentary (Hair, Skin) No erythema around the wound. Wound #8 status is Open. Original cause of wound was Surgical Injury. The wound is located on the Abdomen - midline. The wound measures 3cm length x 2.2cm width x 0.1cm depth; 5.184cm^2 area and 0.518cm^3 volume. There is Fat Layer (Subcutaneous Tissue) Exposed exposed. There is no tunneling or undermining noted. There is a medium amount of  serosanguineous drainage noted. The wound margin is flat and intact. There is medium (34-66%) red, friable granulation within the wound bed. There is a medium (34-66%) amount of necrotic tissue within the wound bed including Adherent Slough. Assessment Active Problems ICD-10 Disruption of external operation (surgical) wound, not elsewhere classified, subsequent encounter Unspecified open wound of abdominal wall, periumbilic region without penetration  into peritoneal cavity, sequela Plan Follow-up Appointments: Return appointment in 3 weeks. Dressing Change Frequency: Change Dressing every other day. Wound Cleansing: May shower and wash wound with soap and water. - with dressing change Primary Wound Dressing: Wound #8 Abdomen - midline: Silver Collagen - moisten with hydrogel. Secondary Dressing: Dry Gauze ABD pad Other: - abdominal binder while up during the day. may remove at night. 1. Continue with silver collagen moistened with hydrogel 2. The surface over the major part of his abdominal wound looked odd I did not see anything I could do to really determine what this is other than to give this some time under moist collagen Electronic Signature(s) Signed: 02/07/2019 5:01:00 PM By: Larry Ham MD Entered By: Larry [name] on 02/07/2019 14:01:00 -------------------------------------------------------------------------------- SuperBill Details Patient Name: Date of Service: Larry [name] 02/07/2019 Medical Record [id number] Patient Account Number: [id number] Date of Birth/Sex: Treating RN: Jan 20, 1951 (68 y.o. Larry [name] Primary Care Provider: Lin [name] Other Clinician: Referring Provider: Treating Provider/Extender:Mohamadou Maciver, Larry [name], Larry [name] Diagnosis Coding ICD-10 Codes Code Description Disruption of external operation (surgical) wound, not elsewhere classified, subsequent T81.31XD encounter Unspecified open wound of abdominal wall, periumbilic region without penetration into peritoneal S31.105S cavity, sequela Facility Procedures The patient participates with Medicare or their insurance follows the Medicare Facility Guidelines: CPT4 Code Description Modifier Quantity [id number] [place] VISIT-LEV 3 EST PT 1 Physician Procedures CPT4: Description Modifier Quantity Code [id number] - WC PHYS LEVEL 3 - EST PT 1 ICD-10 Diagnosis Description T81.31XD  Disruption of external operation (surgical) wound, not elsewhere classified, subsequent encounter S31.105S Unspecified open wound of  abdominal wall, periumbilic region without penetration into peritoneal cavity, sequela Electronic Signature(s) Signed: 02/07/2019 5:01:00 PM By: Larry Ham MD Entered By: Larry [name] on 02/07/2019 14:01:15

## 2019-02-20 ENCOUNTER — Other Ambulatory Visit: Payer: Self-pay | Admitting: Nurse Practitioner

## 2019-02-28 ENCOUNTER — Encounter (HOSPITAL_BASED_OUTPATIENT_CLINIC_OR_DEPARTMENT_OTHER): Payer: Medicare Other | Attending: Internal Medicine | Admitting: Internal Medicine

## 2019-02-28 ENCOUNTER — Other Ambulatory Visit: Payer: Self-pay

## 2019-02-28 DIAGNOSIS — I739 Peripheral vascular disease, unspecified: Secondary | ICD-10-CM | POA: Insufficient documentation

## 2019-02-28 DIAGNOSIS — Z6841 Body Mass Index (BMI) 40.0 and over, adult: Secondary | ICD-10-CM | POA: Diagnosis not present

## 2019-02-28 DIAGNOSIS — N183 Chronic kidney disease, stage 3 unspecified: Secondary | ICD-10-CM | POA: Diagnosis not present

## 2019-02-28 DIAGNOSIS — I130 Hypertensive heart and chronic kidney disease with heart failure and stage 1 through stage 4 chronic kidney disease, or unspecified chronic kidney disease: Secondary | ICD-10-CM | POA: Diagnosis not present

## 2019-02-28 DIAGNOSIS — L98499 Non-pressure chronic ulcer of skin of other sites with unspecified severity: Secondary | ICD-10-CM | POA: Diagnosis not present

## 2019-02-28 DIAGNOSIS — Z955 Presence of coronary angioplasty implant and graft: Secondary | ICD-10-CM | POA: Insufficient documentation

## 2019-02-28 DIAGNOSIS — I872 Venous insufficiency (chronic) (peripheral): Secondary | ICD-10-CM | POA: Insufficient documentation

## 2019-02-28 DIAGNOSIS — Z85038 Personal history of other malignant neoplasm of large intestine: Secondary | ICD-10-CM | POA: Diagnosis not present

## 2019-02-28 DIAGNOSIS — G4733 Obstructive sleep apnea (adult) (pediatric): Secondary | ICD-10-CM | POA: Insufficient documentation

## 2019-02-28 DIAGNOSIS — I2510 Atherosclerotic heart disease of native coronary artery without angina pectoris: Secondary | ICD-10-CM | POA: Insufficient documentation

## 2019-02-28 DIAGNOSIS — T8131XA Disruption of external operation (surgical) wound, not elsewhere classified, initial encounter: Secondary | ICD-10-CM | POA: Diagnosis not present

## 2019-02-28 DIAGNOSIS — I5022 Chronic systolic (congestive) heart failure: Secondary | ICD-10-CM | POA: Diagnosis not present

## 2019-02-28 DIAGNOSIS — Z7982 Long term (current) use of aspirin: Secondary | ICD-10-CM | POA: Diagnosis not present

## 2019-02-28 DIAGNOSIS — J449 Chronic obstructive pulmonary disease, unspecified: Secondary | ICD-10-CM | POA: Insufficient documentation

## 2019-02-28 DIAGNOSIS — Z7902 Long term (current) use of antithrombotics/antiplatelets: Secondary | ICD-10-CM | POA: Diagnosis not present

## 2019-03-01 NOTE — Progress Notes (Addendum)
Burright, Wasil L. ([id number]) Visit Report for 02/28/2019 Arrival Information Details Patient Name: Date of Service: Larry [name], Larry [name] 02/28/2019 1:30 PM Medical Record [id number] Patient Account Number: [id number] Date of Birth/Sex: Treating RN: 1950/09/23 (69 y.o. [name] Primary Care Jessye Imhoff: Lin Landsman Other Clinician: Referring Nova Schmuhl: Treating Jancie Kercher/Extender:Robson, Fernanda Drum, [name] in Treatment: 7 Visit Information History Since Last Visit Added or deleted any medications: No Patient Arrived: Ambulatory Any new allergies or adverse reactions: No Arrival Time: 14:14 Had a fall or experienced change in No Accompanied By: self activities of daily living that may affect Transfer Assistance: None risk of falls: Patient Identification Verified: Yes Signs or symptoms of abuse/neglect since last No Secondary Verification Process Completed: Yes visito Patient Requires Transmission-Based No Hospitalized since last visit: No Precautions: Implantable device outside of the clinic excluding No Patient Has Alerts: No cellular tissue based products placed in the center since last visit: Has Dressing in Place as Prescribed: Yes Pain Present Now: No Electronic Signature(s) Signed: 02/28/2019 5:21:35 PM By: Kela Millin Entered By: Kela Millin on 02/28/2019 14:15:52 -------------------------------------------------------------------------------- Clinic Level of Care Assessment Details Patient Name: Date of Service: Larry [name], Larry [name] 02/28/2019 1:30 PM Medical Record [id number] Patient Account Number: [id number] Date of Birth/Sex: Treating RN: 10/28/1950 (69 y.o. [name] Primary Care Aizza Santiago: Lin Landsman Other Clinician: Referring Vannah Nadal: Treating Jolane Bankhead/Extender:Robson, Fernanda Drum, [name] in Treatment: 7 Clinic Level of Care Assessment Items TOOL 4 Quantity Score X - Use when only an EandM is  performed on FOLLOW-UP visit 1 0 ASSESSMENTS - Nursing Assessment / Reassessment X - Reassessment of Co-morbidities (includes updates in patient status) 1 10 X - Reassessment of Adherence to Treatment Plan 1 5 ASSESSMENTS - Wound and Skin Assessment / Reassessment []  - Simple Wound Assessment / Reassessment - one wound 0 X - Complex Wound Assessment / Reassessment - multiple wounds 3 5 X - Dermatologic / Skin Assessment (not related to wound area) 1 10 ASSESSMENTS - Focused Assessment []  - Circumferential Edema Measurements - multi extremities 0 X - Nutritional Assessment / Counseling / Intervention 1 10 []  - Lower Extremity Assessment (monofilament, tuning fork, pulses) 0 []  - Peripheral Arterial Disease Assessment (using hand held doppler) 0 ASSESSMENTS - Ostomy and/or Continence Assessment and Care []  - Incontinence Assessment and Management 0 []  - Ostomy Care Assessment and Management (repouching, etc.) 0 PROCESS - Coordination of Care []  - Simple Patient / Family Education for ongoing care 0 X - Complex (extensive) Patient / Family Education for ongoing care 1 20 X - Staff obtains Programmer, systems, Records, Test Results / Process Orders 1 10 []  - Staff telephones HHA, Nursing Homes / Clarify orders / etc 0 []  - Routine Transfer to another Facility (non-emergent condition) 0 []  - Routine Hospital Admission (non-emergent condition) 0 []  - New Admissions / Biomedical engineer / Ordering NPWT, Apligraf, etc. 0 []  - Emergency Hospital Admission (emergent condition) 0 []  - Simple Discharge Coordination 0 X - Complex (extensive) Discharge Coordination 1 15 PROCESS - Special Needs []  - Pediatric / Minor Patient Management 0 []  - Isolation Patient Management 0 []  - Hearing / Language / Visual special needs 0 []  - Assessment of Community assistance (transportation, D/C planning, etc.) 0 []  - Additional assistance / Altered mentation 0 []  - Support Surface(s) Assessment (bed, cushion, seat,  etc.) 0 INTERVENTIONS - Wound Cleansing / Measurement []  - Simple Wound Cleansing - one wound 0 X - Complex Wound Cleansing - multiple wounds 3 5 X - Wound Imaging (  photographs - any number of wounds) 1 5 []  - Wound Tracing (instead of photographs) 0 []  - Simple Wound Measurement - one wound 0 X - Complex Wound Measurement - multiple wounds 3 5 INTERVENTIONS - Wound Dressings []  - Small Wound Dressing one or multiple wounds 0 X - Medium Wound Dressing one or multiple wounds 1 15 []  - Large Wound Dressing one or multiple wounds 0 []  - Application of Medications - topical 0 []  - Application of Medications - injection 0 INTERVENTIONS - Miscellaneous []  - External ear exam 0 []  - Specimen Collection (cultures, biopsies, blood, body fluids, etc.) 0 []  - Specimen(s) / Culture(s) sent or taken to Lab for analysis 0 []  - Patient Transfer (multiple staff / Civil Service fast streamer / Similar devices) 0 []  - Simple Staple / Suture removal (25 or less) 0 []  - Complex Staple / Suture removal (26 or more) 0 []  - Hypo / Hyperglycemic Management (close monitor of Blood Glucose) 0 []  - Ankle / Brachial Index (ABI) - do not check if billed separately 0 X - Vital Signs 1 5 Has the patient been seen at the hospital within the last three years: Yes Total Score: 150 Level Of Care: New/Established - Level 4 Electronic Signature(s) Signed: 02/28/2019 5:19:45 PM By: Deon Pilling Entered By: Deon Pilling on 02/28/2019 14:59:04 -------------------------------------------------------------------------------- Encounter Discharge Information Details Patient Name: Date of Service: Larry [name] 02/28/2019 1:30 PM Medical Record [id number] Patient Account Number: [id number] Date of Birth/Sex: Treating RN: March 10, 1950 (69 y.o. [name] Primary Care Neil Errickson: Lin Landsman Other Clinician: Referring Quince Santana: Treating Montserrath Madding/Extender:Robson, Fernanda Drum, [name] in Treatment: 7 Encounter  Discharge Information Items Discharge Condition: Stable Ambulatory Status: Ambulatory Discharge Destination: Home Transportation: Private Auto Accompanied By: self Schedule Follow-up Appointment: Yes Clinical Summary of Care: Patient Declined Electronic Signature(s) Signed: 02/28/2019 5:27:53 PM By: Baruch Gouty RN, BSN Entered By: Baruch Gouty on 02/28/2019 15:05:26 -------------------------------------------------------------------------------- Lower Extremity Assessment Details Patient Name: Date of Service: Larry [name], Larry [name] 02/28/2019 1:30 PM Medical Record [id number] Patient Account Number: [id number] Date of Birth/Sex: Treating RN: 04-19-1950 (68 y.o. [name] Primary Care Lionel Woodberry: Lin Landsman Other Clinician: Referring Amanuel Sinkfield: Treating Alline Pio/Extender:Robson, Fernanda Drum, [name] in Treatment: 7 Electronic Signature(s) Signed: 02/28/2019 5:21:35 PM By: Kela Millin Entered By: Kela Millin on 02/28/2019 14:16:24 -------------------------------------------------------------------------------- Multi Wound Chart Details Patient Name: Date of Service: Larry [name] 02/28/2019 1:30 PM Medical Record [id number] Patient Account Number: [id number] Date of Birth/Sex: Treating RN: 1950/11/29 (69 y.o. M) Primary Care Amea Mcphail: Lin Landsman Other Clinician: Referring Tyrann Donaho: Treating Real Cona/Extender:Robson, Fernanda Drum, [name] in Treatment: 7 Vital Signs Height(in): 71 Pulse(bpm): 65 Weight(lbs): 49 Blood Pressure(mmHg): 131/64 Body Mass Index(BMI): 48 Temperature(F): 97.5 Respiratory 18 Rate(breaths/min): Photos: [10:No Photos] [8:No Photos] [9:No Photos] Wound Location: [10:Abdomen - midline - Proximal] [8:Abdomen - midline] [9:Abdomen - midline - Distal] Wounding Event: [10:Gradually Appeared] [8:Surgical Injury] [9:Gradually Appeared] Primary Etiology: [10:Open Surgical Wound] [8:Open Surgical  Wound] [9:Open Surgical Wound] Comorbid History: [10:Asthma, Chronic Obstructive Pulmonary Disease (COPD), Sleep Apnea, Arrhythmia, Congestive Heart Failure, Congestive Heart Failure, Congestive Heart Failure, Coronary Artery Disease, Coronary Artery Disease, Coronary Artery Disease,  Hypertension, Peripheral Hypertension, Peripheral Hypertension, Peripheral Arterial Disease, Peripheral Arterial Disease, Peripheral Arterial Disease, Peripheral Venous Disease] [8:Asthma, Chronic Obstructive Pulmonary Disease (COPD), Sleep Apnea,  Arrhythmia, Venous Disease] [9:Asthma, Chronic Obstructive Pulmonary Disease (COPD), Sleep Apnea, Arrhythmia, Venous Disease] Date Acquired: [10:02/28/2019] [8:02/21/2017] [9:02/28/2019] Weeks of Treatment: [10:0] [8:7] [9:0] Wound Status: [10:Open] [8:Open] [9:Open] Clustered Wound: [10:No] [8:Yes] [9:No] Measurements L  x W x D 0.4x0.3x0.1 [8:4.1x3.5x0.1] [9:1x1.2x0.1] (cm) Area (cm) : [10:0.094] [8:11.27] [9:0.942] Volume (cm) : [10:0.009] [8:1.127] [9:0.094] % Reduction in Area: [10:N/A] [8:85.90%] [9:N/A] % Reduction in Volume: N/A [8:85.90%] [9:N/A] Classification: [10:Full Thickness Without Exposed Support Structures Exposed Support Structures Exposed Support Structures] [8:Full Thickness Without] [9:Full Thickness Without] Exudate Amount: [10:Small] [8:Medium] [9:Small] Exudate Type: [10:Serosanguineous] [8:Serosanguineous] [9:Serosanguineous] Exudate Color: [10:red, brown] [8:red, brown] [9:red, brown] Wound Margin: [10:Distinct, outline attached Flat and Intact] [9:Distinct, outline attached] Granulation Amount: [10:Large (67-100%)] [8:Medium (34-66%)] [9:Large (67-100%)] Granulation Quality: [10:Pink] [8:Red, Friable] [9:Red, Pink] Necrotic Amount: [10:None Present (0%)] [8:Medium (34-66%)] [9:None Present (0%)] Exposed Structures: [10:Fat Layer (Subcutaneous Fat Layer (Subcutaneous Fat Layer (Subcutaneous Tissue) Exposed: Yes Fascia: No Tendon: No Muscle: No  Joint: No Bone: No None] [8:Tissue) Exposed: Yes Fascia: No Tendon: No Muscle: No Joint: No Bone: No Small (1-33%)]  [9:Tissue) Exposed: Yes Fascia: No Tendon: No Muscle: No Joint: No Bone: No None] Treatment Notes Wound #10 (Proximal Abdomen - midline) 3. Primary Dressing Applied Iodoflex 4. Secondary Dressing ABD Pad 5. Secured With Medipore tape Wound #8 (Abdomen - midline) 3. Primary Dressing Applied Iodoflex 4. Secondary Dressing ABD Pad 5. Secured With Medipore tape Wound #9 (Distal Abdomen - midline) 3. Primary Dressing Applied Iodoflex 4. Secondary Dressing ABD Pad 5. Secured With [employer]) Signed: 03/01/2019 4:55:01 AM By: Linton Ham MD Entered By: Linton Ham on 02/28/2019 15:15:17 -------------------------------------------------------------------------------- Multi-Disciplinary Care Plan Details Patient Name: Date of Service: Larry [name], Larry [name] 02/28/2019 1:30 PM Medical Record [id number] Patient Account Number: [id number] Date of Birth/Sex: Treating RN: 10/27/1950 (69 y.o. [name] Primary Care Kymia Simi: Lin Landsman Other Clinician: Referring Jiselle Sheu: Treating Lilee Aldea/Extender:Robson, Fernanda Drum, [name] in Treatment: 7 Active Inactive Wound/Skin Impairment Nursing Diagnoses: Impaired tissue integrity Goals: Ulcer/skin breakdown will have a volume reduction of 30% by week 4 Date Initiated: 01/04/2019 Target Resolution Date: 04/05/2019 Goal Status: Active Interventions: Provide education on ulcer and skin care Notes: Electronic Signature(s) Signed: 02/28/2019 5:19:45 PM By: Deon Pilling Entered By: Deon Pilling on 02/28/2019 14:47:35 -------------------------------------------------------------------------------- Pain Assessment Details Patient Name: Date of Service: Larry [name], Larry [name] 02/28/2019 1:30 PM Medical Record [id number] Patient Account Number: [id number] Date of  Birth/Sex: Treating RN: May 17, 1950 (69 y.o. [name] Primary Care Bodee Lafoe: Lin Landsman Other Clinician: Referring Osmany Azer: Treating Mahi Zabriskie/Extender:Robson, Fernanda Drum, [name] in Treatment: 7 Active Problems Location of Pain Severity and Description of Pain Patient Has Paino No Site Locations Pain Management and Medication Current Pain Management: Electronic Signature(s) Signed: 02/28/2019 5:21:35 PM By: Kela Millin Entered By: Kela Millin on 02/28/2019 14:16:18 -------------------------------------------------------------------------------- Patient/Caregiver Education Details Patient Name: Larry Marvel L. Date of Service: 1/7/2021andnbsp1:30 PM Medical Record [id number] Patient Account Number: [id number] Date of Birth/Gender: [birth year] (69 y.o. M) Treating RN: Deon Pilling Primary Care Physician: Lin Landsman Other Clinician: Referring Physician: Treating Physician/Extender:Robson, Fernanda Drum, [name] in Treatment: 7 Education Assessment Education Provided To: Patient Education Topics Provided Wound/Skin Impairment: Handouts: Skin Care Do's and Dont's Methods: Explain/Verbal Responses: Reinforcements needed Electronic Signature(s) Signed: 02/28/2019 5:19:45 PM By: Deon Pilling Entered By: Deon Pilling on 02/28/2019 14:47:45 -------------------------------------------------------------------------------- Wound Assessment Details Patient Name: Date of Service: Larry [name], Larry [name] 02/28/2019 1:30 PM Medical Record [id number] Patient Account Number: [id number] Date of Birth/Sex: Treating RN: April 14, 1950 (68 y.o. [name] Primary Care Ingris Pasquarella: Lin Landsman Other Clinician: Referring Artesha Wemhoff: Treating Presley Summerlin/Extender:Robson, Fernanda Drum, [name] in Treatment: 7 Wound Status Wound Number: 10 Primary Open Surgical Wound Etiology: Wound Location: Abdomen - midline - Proximal  Wound  Open Wounding Event: Gradually Appeared Status: Date Acquired: 02/28/2019 Comorbid Asthma, Chronic Obstructive Pulmonary Weeks Of Treatment: 0 History: Disease (COPD), Sleep Apnea, Arrhythmia, Clustered Wound: No Congestive Heart Failure, Coronary Artery Disease, Hypertension, Peripheral Arterial Disease, Peripheral Venous Disease Photos Wound Measurements Length: (cm) 0.4 % Reduct Width: (cm) 0.3 % Reduct Depth: (cm) 0.1 Epitheli Area: (cm) 0.094 Tunneli Volume: (cm) 0.009 Undermi Wound Description Classification: Full Thickness Without Exposed Support Foul Odo Structures Slough/F Wound Distinct, outline attached Margin: Exudate Small Amount: Exudate Serosanguineous Type: Exudate red, brown Color: Wound Bed Granulation Amount: Large (67-100%) Granulation Quality: Pink Fascia E Necrotic Amount: None Present (0%) Fat Laye Tendon E Muscle E Joint Ex Bone Exp r After Cleansing: No ibrino No Exposed Structure xposed: No r (Subcutaneous Tissue) Exposed: Yes xposed: No xposed: No posed: No osed: No ion in Area: 0% ion in Volume: 0% alization: None ng: No ning: No Treatment Notes Wound #10 (Proximal Abdomen - midline) 3. Primary Dressing Applied Iodoflex 4. Secondary Dressing ABD Pad 5. Secured With [facility] [facility] [employer]) Signed: 03/04/2019 4:37:37 PM By: Mikeal Hawthorne EMT/HBOT Signed: 03/04/2019 5:57:16 PM By: Kela Millin Previous Signature: 02/28/2019 5:21:35 PM Version By: Kela Millin Entered By: Mikeal Hawthorne on 03/04/2019 11:04:24 -------------------------------------------------------------------------------- Wound Assessment Details Patient Name: Date of Service: Larry [name], Larry [name] 02/28/2019 1:30 PM Medical Record [id number] Patient Account Number: [id number] Date of Birth/Sex: Treating RN: 1950-08-18 (69 y.o. [name] Primary Care Jahzeel Poythress: Lin Landsman Other Clinician: Referring Mada Sadik:  Treating Lorieann Argueta/Extender:Robson, Fernanda Drum, [name] in Treatment: 7 Wound Status Wound Number: 8 Primary Open Surgical Wound Etiology: Wound Location: Abdomen - midline Wound Open Wounding Event: Surgical Injury Status: Date Acquired: 02/21/2017 Comorbid Asthma, Chronic Obstructive Pulmonary Weeks Of Treatment: 7 History: Disease (COPD), Sleep Apnea, Arrhythmia, Clustered Wound: Yes Congestive Heart Failure, Coronary Artery Disease, Hypertension, Peripheral Arterial Disease, Peripheral Venous Disease Photos Wound Measurements Length: (cm) 4.1 % Reduct Width: (cm) 3.5 % Reduct Depth: (cm) 0.1 Epitheli Area: (cm) 11.27 Tunneli Volume: (cm) 1.127 Undermi Wound Description Classification: Full Thickness Without Exposed Support Foul Odo Structures Slough/F Wound Flat and Intact Margin: Exudate Medium Amount: Exudate Serosanguineous Type: Exudate red, brown Color: Wound Bed Granulation Amount: Medium (34-66%) Granulation Quality: Red, Friable Fascia E Necrotic Amount: Medium (34-66%) Fat Laye Necrotic Quality: Adherent Slough Tendon E Muscle E Joint Ex Bone Exp r After Cleansing: No ibrino Yes Exposed Structure xposed: No r (Subcutaneous Tissue) Exposed: Yes xposed: No xposed: No posed: No osed: No ion in Area: 85.9% ion in Volume: 85.9% alization: Small (1-33%) ng: No ning: No Treatment Notes Wound #8 (Abdomen - midline) 3. Primary Dressing Applied Iodoflex 4. Secondary Dressing ABD Pad 5. Secured With [facility] [facility] [employer]) Signed: 03/04/2019 4:37:37 PM By: Mikeal Hawthorne EMT/HBOT Signed: 03/04/2019 5:57:16 PM By: Kela Millin Previous Signature: 02/28/2019 5:21:35 PM Version By: Kela Millin Entered By: Mikeal Hawthorne on 03/04/2019 11:11:03 -------------------------------------------------------------------------------- Wound Assessment Details Patient Name: Date of Service: Larry [name], Larry [name] 02/28/2019 1:30  PM Medical Record [id number] Patient Account Number: [id number] Date of Birth/Sex: Treating RN: [birth year] (69 y.o. [name] Primary Care Bonney Berres: Lin Landsman Other Clinician: Referring Elyanah Farino: Treating Gillis Boardley/Extender:Robson, Fernanda Drum, [name] in Treatment: 7 Wound Status Wound Number: 9 Primary Open Surgical Wound Etiology: Wound Location: Abdomen - midline - Distal Wound Open Wounding Event: Gradually Appeared Status: Date Acquired: 02/28/2019 Comorbid Asthma, Chronic Obstructive Pulmonary Weeks Of Treatment: 0 History: Disease (COPD), Sleep Apnea, Arrhythmia, Clustered Wound: No Congestive Heart Failure, Coronary Artery Disease, Hypertension,  Peripheral Arterial Disease, Peripheral Venous Disease Photos Wound Measurements Length: (cm) 1 % Reduct Width: (cm) 1.2 % Reduct Depth: (cm) 0.1 Epitheli Area: (cm) 0.942 Tunneli Volume: (cm) 0.094 Undermi Wound Description Classification: Full Thickness Without Exposed Support Foul Odo Structures Slough/F Wound Distinct, outline attached Margin: Exudate Small Amount: Exudate Serosanguineous Type: Exudate Exudate red, brown Color: Wound Bed Granulation Amount: Large (67-100%) Granulation Quality: Red, Pink Fascia Ex Necrotic Amount: None Present (0%) Fat Layer Tendon Ex Muscle Ex Joint Exp Bone Expo r After Cleansing: No ibrino No Exposed Structure posed: No (Subcutaneous Tissue) Exposed: Yes posed: No posed: No osed: No sed: No ion in Area: 0% ion in Volume: 0% alization: None ng: No ning: No Treatment Notes Wound #9 (Distal Abdomen - midline) 3. Primary Dressing Applied Iodoflex 4. Secondary Dressing ABD Pad 5. Secured With [employer] [facility] [employer]) Signed: 03/04/2019 4:37:37 PM By: Mikeal Hawthorne EMT/HBOT Signed: 03/04/2019 5:57:16 PM By: Kela Millin Previous Signature: 02/28/2019 5:21:35 PM Version By: Kela Millin Entered By: Mikeal Hawthorne  on 03/04/2019 11:04:47 -------------------------------------------------------------------------------- Vitals Details Patient Name: Date of Service: Larry [name]. 02/28/2019 1:30 PM Medical Record [id number] Patient Account Number: [id number] Date of Birth/Sex: Treating RN: 02-Sep-1950 (69 y.o. [name] Primary Care Pietrina Jagodzinski: Lin Landsman Other Clinician: Referring Sharren Schnurr: Treating Shekera Beavers/Extender:Robson, Fernanda Drum, [name] in Treatment: 7 Vital Signs Time Taken: 14:15 Temperature (F): 97.5 Height (in): 71 Pulse (bpm): 97 Weight (lbs): 344 Respiratory Rate (breaths/min): 18 Body Mass Index (BMI): 48 Blood Pressure (mmHg): 131/64 Reference Range: 80 - 120 mg / dl Electronic Signature(s) Signed: 02/28/2019 5:21:35 PM By: Kela Millin Entered By: Kela Millin on 02/28/2019 14:16:12

## 2019-03-01 NOTE — Progress Notes (Signed)
Godwin, Kinser L. ([id number]) Visit Report for 02/28/2019 HPI Details Patient Name: Date of Service: QUESHAWN, SIMCIK 02/28/2019 1:30 PM Medical Record [id number] Patient Account Number: [id number] Date of Birth/Sex: Treating RN: 1950/09/24 (69 y.o. M) Primary Care Provider: Lin Landsman Other Clinician: Referring Provider: Treating Provider/Extender:Kieon Lawhorn, Fernanda Drum, [name] in Treatment: 7 History of Present Illness Location: Patient presents with a wound to left lower leg. Quality: Patient reports No Pain. Severity: Patient states wound(s) are getting worse. Duration: Patient has had the wound for < 2 weeks prior to presenting for treatment Modifying Factors: pvd HPI Description: pt smoked until 5 years ago. cancer surgery 1 year ago. has ruq stoma. cv studies last year revealed non-compressible vessels. recent pe by pcp yielded some heart abnormality and he is scheduled for stress test. 5/24 2 new wounds on rt leg. left side healed 08/12/14; several small wounds on the right lateral leg and one wound on the left leg which is new. The edema control here does not look adequate. There is also some degree of maceration around the wounds on the right lateral leg READMISSION 09/14/2018 This is a patient we had in this clinic in 2013 and then again in 2016 with wounds on his right lateral leg secondary to chronic venous insufficiency. He was cared for I believe in 2016 by Dr. Lindon Romp. The patient is here for review of 6 small wounds within a large surgical area on his abdomen. The patient had surgery for a malignant neoplasm of the sigmoid colon with a colostomy. I cannot see the date of the exact surgery in epic. In February 2018 he had an attempted colostomy reversal however he had a microperforation I believe at the anastomosis. He required a repeat laparotomy. He had an ileostomy raised at that time. I believe the surgical wound at that time was left to close via  secondary intention. The patient is unaware whether he had a wound VAC on this. In any case this almost is completely closed except he has been left with 6 small eraser head sized areas on the abdomen that have not healed. I am not exactly sure how he was how he is been dressing this. He has been followed with frequent visits in general surgery dating back at least over a year. Past medical history includes chronic systolic heart failure, stage III chronic renal failure. Malignant neoplasm of the sigmoid colon, morbid obesity. 7/31; not a lot of change 6 small wounds within a large surgical area on his abdomen. None of these are hyper granulated as opposed to last week. None of them have exposed mesh or surrounding infection. He has been using [facility] 8/14- He comes after 2 weeks, he has only 2 open wounds that I can tell this time, he has been using Hydrofera Blue 8/28; 2-week follow-up. Very superficial areas on the abdominal surgical site scar. He is using Hydrofera Blue and this seems to be doing a good job. He has not made it down to the medical supply store to see if he can get an abdominal binder or something to keep the tension off this wound area. READMISSION 01/04/2019 This is a 69 year old man we had in the clinic from July through August 2020. He had wounds in the midline abdominal scar. [Please see history from my note of 09/14/2018}. When he was here last time we used Hydrofera Blue. Things seem to be improving. He did not discharge in a healed state his last visit here was on 8/28. He  states he continued to use the [facility] and at 1 point this was very close to healing but more recently it is reopened. The wounds are basically in the same state as last time superficial friable easily bleeding wounds. Some hyper granulation. He has an abdominal binder that he got at [facility] however he says it is too tight for him to get on. Past medical history is reviewed; he  has coronary artery disease, stage III chronic renal failure heart failure with reduced ejection fraction, COPD, hypertension, obstructive sleep apnea. He was hospitalized since he was here last time for a severe pharyngitis. 11/19; the patient was in the ER on 11/16; this was for oozing from his abdominal wound. He was cauterized with quick clot gauze and epi and silver nitrate. This was felt to be venous oozing. He has very friable wounds in this area. He is using his abdominal binder. 12/3; the patient was in the ER with bleeding from the lower part of his incision in the abdomen on 11/28. Had a stitch placed to contain the bleeding area. This was just underneath his major wound. He saw general surgery yesterday and apparently we are to remove the stitch in 2 weeks. The patient is on Plavix and aspirin for coronary artery disease with stents. His bleeding was felt to be capillary. 12/17; I removed the stitch from the bleeding area that was placed last time. This came out with some difficulty. The major area almost looks like slough with an area of cellophane over it. I did not attempt any further debridement 02/28/2019. The patient has new areas superiorly and inferiorly to the major wound in the middle. The area in the middle has a nonviable surface over perhaps 60 to 70% however debridement causes uncontrolled bleeding here. He is on Plavix. We have been usin hydrogel and Prisma. Electronic Signature(s) Signed: 03/01/2019 4:55:01 AM By: Linton Ham MD Entered By: Linton Ham on 02/28/2019 15:18:18 -------------------------------------------------------------------------------- Physical Exam Details Patient Name: Date of Service: [name] 02/28/2019 1:30 PM Medical Record [id number] Patient Account Number: [id number] Date of Birth/Sex: Treating RN: 08/06/1950 (69 y.o. M) Primary Care Provider: Lin Landsman Other Clinician: Referring Provider: Treating  Provider/Extender:Azeneth Carbonell, Fernanda Drum, [name] in Treatment: 7 Constitutional Sitting or standing Blood Pressure is within target range for patient.. Pulse regular and within target range for patient.[name] Kitchen Respirations regular, non-labored and within target range.. Temperature is normal and within the target range for the patient.[name] Kitchen Appears in no distress. Gastrointestinal (GI) Abdomen is soft. No liver or spleen enlargement. Notes Wound exam; areas in the mid part of his abdominal incision. Again the same general appearance. I have been reluctant to debride this because of copious bleeding but if it stalls I may need to try it. He has new areas superiorly and inferiorly which are small. Electronic Signature(s) Signed: 03/01/2019 4:55:01 AM By: Linton Ham MD Entered By: Linton Ham on 02/28/2019 15:19:35 -------------------------------------------------------------------------------- Physician Orders Details Patient Name: Date of Service: [name] 02/28/2019 1:30 PM Medical Record [id number] Patient Account Number: [id number] Date of Birth/Sex: Treating RN: 1950/10/26 (69 y.o. [name] Primary Care Provider: Lin Landsman Other Clinician: Referring Provider: Treating Provider/Extender:Reaghan Kawa, Fernanda Drum, [name] in Treatment: 7 Verbal / Phone Orders: No Diagnosis Coding ICD-10 Coding Code Description Disruption of external operation (surgical) wound, not elsewhere classified, subsequent T81.31XD encounter Unspecified open wound of abdominal wall, periumbilic region without penetration into peritoneal S31.105S cavity, sequela Follow-up Appointments Return Appointment in 2 weeks. Dressing Change Frequency  Change Dressing every other day. Wound Cleansing May shower and wash wound with soap and water. - with dressing change Primary Wound Dressing Wound #10 Proximal Abdomen - midline Iodoflex Wound #8 Abdomen - midline Iodoflex Wound  #9 Distal Abdomen - midline Iodoflex Secondary Dressing Dry Gauze ABD pad Other: - abdominal binder while up during the day. may remove at night. Electronic Signature(s) Signed: 02/28/2019 5:19:45 PM By: Deon Pilling Signed: 03/01/2019 4:55:01 AM By: Linton Ham MD Entered By: Deon Pilling on 02/28/2019 14:54:08 -------------------------------------------------------------------------------- Problem List Details Patient Name: Date of Service: [name] 02/28/2019 1:30 PM Medical Record [id number] Patient Account Number: [id number] Date of Birth/Sex: Treating RN: 1951/01/15 (68 y.o. [name] Primary Care Provider: Lin Landsman Other Clinician: Referring Provider: Treating Provider/Extender:Nasia Cannan, Fernanda Drum, [name] in Treatment: 7 Active Problems ICD-10 Evaluated Encounter Code Description Active Date Today Diagnosis T81.31XD Disruption of external operation (surgical) wound, not 01/04/2019 No Yes elsewhere classified, subsequent encounter S31.105S Unspecified open wound of abdominal wall, [id number] No Yes periumbilic region without penetration into peritoneal cavity, sequela Inactive Problems Resolved Problems Electronic Signature(s) Signed: 03/01/2019 4:55:01 AM By: Linton Ham MD Entered By: Linton Ham on 02/28/2019 15:15:11 -------------------------------------------------------------------------------- Progress Note Details Patient Name: Date of Service: [name] 02/28/2019 1:30 PM Medical Record [id number] Patient Account Number: [id number] Date of Birth/Sex: Treating RN: 10/08/1950 (69 y.o. M) Primary Care Provider: Lin Landsman Other Clinician: Referring Provider: Treating Provider/Extender:Graesyn Schreifels, Fernanda Drum, [name] in Treatment: 7 Subjective History of Present Illness (HPI) The following HPI elements were documented for the patient's wound: Location: Patient presents with a wound to left  lower leg. Quality: Patient reports No Pain. Severity: Patient states wound(s) are getting worse. Duration: Patient has had the wound for < 2 weeks prior to presenting for treatment Modifying Factors: pvd pt smoked until 5 years ago. cancer surgery 1 year ago. has ruq stoma. cv studies last year revealed non- compressible vessels. recent pe by pcp yielded some heart abnormality and he is scheduled for stress test. 5/24 2 new wounds on rt leg. left side healed 08/12/14; several small wounds on the right lateral leg and one wound on the left leg which is new. The edema control here does not look adequate. There is also some degree of maceration around the wounds on the right lateral leg READMISSION 09/14/2018 This is a patient we had in this clinic in 2013 and then again in 2016 with wounds on his right lateral leg secondary to chronic venous insufficiency. He was cared for I believe in 2016 by Dr. Lindon Romp. The patient is here for review of 6 small wounds within a large surgical area on his abdomen. The patient had surgery for a malignant neoplasm of the sigmoid colon with a colostomy. I cannot see the date of the exact surgery in epic. In February 2018 he had an attempted colostomy reversal however he had a microperforation I believe at the anastomosis. He required a repeat laparotomy. He had an ileostomy raised at that time. I believe the surgical wound at that time was left to close via secondary intention. The patient is unaware whether he had a wound VAC on this. In any case this almost is completely closed except he has been left with 6 small eraser head sized areas on the abdomen that have not healed. I am not exactly sure how he was how he is been dressing this. He has been followed with frequent visits in general surgery dating back at least over a year.  Past medical history includes chronic systolic heart failure, stage III chronic renal failure. Malignant neoplasm of the sigmoid colon,  morbid obesity. 7/31; not a lot of change 6 small wounds within a large surgical area on his abdomen. None of these are hyper granulated as opposed to last week. None of them have exposed mesh or surrounding infection. He has been using [facility] 8/14- He comes after 2 weeks, he has only 2 open wounds that I can tell this time, he has been using Hydrofera Blue 8/28; 2-week follow-up. Very superficial areas on the abdominal surgical site scar. He is using Hydrofera Blue and this seems to be doing a good job. He has not made it down to the medical supply store to see if he can get an abdominal binder or something to keep the tension off this wound area. READMISSION 01/04/2019 This is a 69 year old man we had in the clinic from July through August 2020. He had wounds in the midline abdominal scar. [Please see history from my note of 09/14/2018}. When he was here last time we used Hydrofera Blue. Things seem to be improving. He did not discharge in a healed state his last visit here was on 8/28. He states he continued to use the [facility] and at 1 point this was very close to healing but more recently it is reopened. The wounds are basically in the same state as last time superficial friable easily bleeding wounds. Some hyper granulation. He has an abdominal binder that he got at [facility] however he says it is too tight for him to get on. Past medical history is reviewed; he has coronary artery disease, stage III chronic renal failure heart failure with reduced ejection fraction, COPD, hypertension, obstructive sleep apnea. He was hospitalized since he was here last time for a severe pharyngitis. 11/19; the patient was in the ER on 11/16; this was for oozing from his abdominal wound. He was cauterized with quick clot gauze and epi and silver nitrate. This was felt to be venous oozing. He has very friable wounds in this area. He is using his abdominal binder. 12/3; the patient was in  the ER with bleeding from the lower part of his incision in the abdomen on 11/28. Had a stitch placed to contain the bleeding area. This was just underneath his major wound. He saw general surgery yesterday and apparently we are to remove the stitch in 2 weeks. The patient is on Plavix and aspirin for coronary artery disease with stents. His bleeding was felt to be capillary. 12/17; I removed the stitch from the bleeding area that was placed last time. This came out with some difficulty. The major area almost looks like slough with an area of cellophane over it. I did not attempt any further debridement 02/28/2019. The patient has new areas superiorly and inferiorly to the major wound in the middle. The area in the middle has a nonviable surface over perhaps 60 to 70% however debridement causes uncontrolled bleeding here. He is on Plavix. We have been usin hydrogel and Prisma. Objective Constitutional Sitting or standing Blood Pressure is within target range for patient.. Pulse regular and within target range for patient.[name] Kitchen Respirations regular, non-labored and within target range.. Temperature is normal and within the target range for the patient.[name] Kitchen Appears in no distress. Vitals Time Taken: 2:15 PM, Height: 71 in, Weight: 344 lbs, BMI: 48, Temperature: 97.5 F, Pulse: 97 bpm, Respiratory Rate: 18 breaths/min, Blood Pressure: 131/64 mmHg. Gastrointestinal (GI) Abdomen is  soft. No liver or spleen enlargement. General Notes: Wound exam; areas in the mid part of his abdominal incision. Again the same general appearance. I have been reluctant to debride this because of copious bleeding but if it stalls I may need to try it. He has new areas superiorly and inferiorly which are small. Integumentary (Hair, Skin) Wound #10 status is Open. Original cause of wound was Gradually Appeared. The wound is located on the Proximal Abdomen - midline. The wound measures 0.4cm length x 0.3cm width x 0.1cm depth;  0.094cm^2 area and 0.009cm^3 volume. There is Fat Layer (Subcutaneous Tissue) Exposed exposed. There is no tunneling or undermining noted. There is a small amount of serosanguineous drainage noted. The wound margin is distinct with the outline attached to the wound base. There is large (67-100%) pink granulation within the wound bed. There is no necrotic tissue within the wound bed. Wound #8 status is Open. Original cause of wound was Surgical Injury. The wound is located on the Abdomen - midline. The wound measures 4.1cm length x 3.5cm width x 0.1cm depth; 11.27cm^2 area and 1.127cm^3 volume. There is Fat Layer (Subcutaneous Tissue) Exposed exposed. There is no tunneling or undermining noted. There is a medium amount of serosanguineous drainage noted. The wound margin is flat and intact. There is medium (34-66%) red, friable granulation within the wound bed. There is a medium (34-66%) amount of necrotic tissue within the wound bed including Adherent Slough. Wound #9 status is Open. Original cause of wound was Gradually Appeared. The wound is located on the Distal Abdomen - midline. The wound measures 1cm length x 1.2cm width x 0.1cm depth; 0.942cm^2 area and 0.094cm^3 volume. There is Fat Layer (Subcutaneous Tissue) Exposed exposed. There is no tunneling or undermining noted. There is a small amount of serosanguineous drainage noted. The wound margin is distinct with the outline attached to the wound base. There is large (67-100%) red, pink granulation within the wound bed. There is no necrotic tissue within the wound bed. Assessment Active Problems ICD-10 Disruption of external operation (surgical) wound, not elsewhere classified, subsequent encounter Unspecified open wound of abdominal wall, periumbilic region without penetration into peritoneal cavity, sequela Plan Follow-up Appointments: Return Appointment in 2 weeks. Dressing Change Frequency: Change Dressing every other day. Wound  Cleansing: May shower and wash wound with soap and water. - with dressing change Primary Wound Dressing: Wound #10 Proximal Abdomen - midline: Iodoflex Wound #8 Abdomen - midline: Iodoflex Wound #9 Distal Abdomen - midline: Iodoflex Secondary Dressing: Dry Gauze ABD pad Other: - abdominal binder while up during the day. may remove at night. 1. I change the primary dressing here to Iodoflex. 2. The next time I see him in 2 weeks I will likely attempt debridement of the major abdominal area standing by with silver nitrate. Electronic Signature(s) Signed: 03/01/2019 4:55:01 AM By: Linton Ham MD Entered By: Linton Ham on 02/28/2019 15:20:16 -------------------------------------------------------------------------------- SuperBill Details Patient Name: Date of Service: [name] 02/28/2019 Medical Record [id number] Patient Account Number: [id number] Date of Birth/Sex: Treating RN: 27-Feb-1950 (68 y.o. [name] Primary Care Provider: Lin Landsman Other Clinician: Referring Provider: Treating Provider/Extender:Sanita Estrada, Fernanda Drum, [name] in Treatment: 7 Diagnosis Coding ICD-10 Codes Code Description Disruption of external operation (surgical) wound, not elsewhere classified, subsequent T81.31XD encounter Unspecified open wound of abdominal wall, periumbilic region without penetration into peritoneal S31.105S cavity, sequela Facility Procedures Physician Procedures CPT4: Code [id number] Description: 9212 - WC PHYS LEVEL 2 - EST PT ICD-10 Diagnosis Description T81.31XD  Disruption of external operation (surgical) wound, not e subsequent encounter S31.105S Unspecified open wound of abdominal wall, periumbilic re into peritoneal cavity,  sequela Modifier: lsewhere classi gion without pe Quantity: 1 fied, netration Electronic Signature(s) Signed: 03/01/2019 4:55:01 AM By: Linton Ham MD Entered By: Linton Ham on 02/28/2019 15:20:45

## 2019-03-14 ENCOUNTER — Encounter (HOSPITAL_BASED_OUTPATIENT_CLINIC_OR_DEPARTMENT_OTHER): Payer: Medicare Other | Admitting: Internal Medicine

## 2019-03-14 ENCOUNTER — Other Ambulatory Visit: Payer: Self-pay

## 2019-03-14 DIAGNOSIS — L98499 Non-pressure chronic ulcer of skin of other sites with unspecified severity: Secondary | ICD-10-CM | POA: Diagnosis not present

## 2019-03-14 DIAGNOSIS — I2510 Atherosclerotic heart disease of native coronary artery without angina pectoris: Secondary | ICD-10-CM | POA: Diagnosis not present

## 2019-03-14 DIAGNOSIS — T8189XA Other complications of procedures, not elsewhere classified, initial encounter: Secondary | ICD-10-CM | POA: Diagnosis not present

## 2019-03-14 DIAGNOSIS — Z85038 Personal history of other malignant neoplasm of large intestine: Secondary | ICD-10-CM | POA: Diagnosis not present

## 2019-03-14 DIAGNOSIS — G4733 Obstructive sleep apnea (adult) (pediatric): Secondary | ICD-10-CM | POA: Diagnosis not present

## 2019-03-14 DIAGNOSIS — Z6841 Body Mass Index (BMI) 40.0 and over, adult: Secondary | ICD-10-CM | POA: Diagnosis not present

## 2019-03-14 NOTE — Progress Notes (Addendum)
Swailes, Rolin L. ([id number]) Visit Report for 03/14/2019 HPI Details Patient Name: Date of Service: Larry [name], Larry [name] 03/14/2019 1:30 PM Medical Record [id number] Patient Account Number: [id number] Date of Birth/Sex: Treating RN: 02-Aug-1950 (69 y.o. M) Primary Care Provider: Lin Landsman Other Clinician: Referring Provider: Treating Provider/Extender:Champion Corales, Fernanda Drum, [name] in Treatment: 9 History of Present Illness Location: Patient presents with a wound to left lower leg. Quality: Patient reports No Pain. Severity: Patient states wound(s) are getting worse. Duration: Patient has had the wound for < 2 weeks prior to presenting for treatment Modifying Factors: pvd HPI Description: pt smoked until 5 years ago. cancer surgery 1 year ago. has ruq stoma. cv studies last year revealed non-compressible vessels. recent pe by pcp yielded some heart abnormality and he is scheduled for stress test. 5/24 2 new wounds on rt leg. left side healed 08/12/14; several small wounds on the right lateral leg and one wound on the left leg which is new. The edema control here does not look adequate. There is also some degree of maceration around the wounds on the right lateral leg READMISSION 09/14/2018 This is a patient we had in this clinic in 2013 and then again in 2016 with wounds on his right lateral leg secondary to chronic venous insufficiency. He was cared for I believe in 2016 by Dr. Lindon Romp. The patient is here for review of 6 small wounds within a large surgical area on his abdomen. The patient had surgery for a malignant neoplasm of the sigmoid colon with a colostomy. I cannot see the date of the exact surgery in epic. In February 2018 he had an attempted colostomy reversal however he had a microperforation I believe at the anastomosis. He required a repeat laparotomy. He had an ileostomy raised at that time. I believe the surgical wound at that time was left to close via  secondary intention. The patient is unaware whether he had a wound VAC on this. In any case this almost is completely closed except he has been left with 6 small eraser head sized areas on the abdomen that have not healed. I am not exactly sure how he was how he is been dressing this. He has been followed with frequent visits in general surgery dating back at least over a year. Past medical history includes chronic systolic heart failure, stage III chronic renal failure. Malignant neoplasm of the sigmoid colon, morbid obesity. 7/31; not a lot of change 6 small wounds within a large surgical area on his abdomen. None of these are hyper granulated as opposed to last week. None of them have exposed mesh or surrounding infection. He has been using [facility] 8/14- He comes after 2 weeks, he has only 2 open wounds that I can tell this time, he has been using Hydrofera Blue 8/28; 2-week follow-up. Very superficial areas on the abdominal surgical site scar. He is using Hydrofera Blue and this seems to be doing a good job. He has not made it down to the medical supply store to see if he can get an abdominal binder or something to keep the tension off this wound area. READMISSION 01/04/2019 This is a 69 year old man we had in the clinic from July through August 2020. He had wounds in the midline abdominal scar. [Please see history from my note of 09/14/2018}. When he was here last time we used Hydrofera Blue. Things seem to be improving. He did not discharge in a healed state his last visit here was on 8/28. He  states he continued to use the [facility] and at 1 point this was very close to healing but more recently it is reopened. The wounds are basically in the same state as last time superficial friable easily bleeding wounds. Some hyper granulation. He has an abdominal binder that he got at [facility] however he says it is too tight for him to get on. Past medical history is reviewed; he  has coronary artery disease, stage III chronic renal failure heart failure with reduced ejection fraction, COPD, hypertension, obstructive sleep apnea. He was hospitalized since he was here last time for a severe pharyngitis. 11/19; the patient was in the ER on 11/16; this was for oozing from his abdominal wound. He was cauterized with quick clot gauze and epi and silver nitrate. This was felt to be venous oozing. He has very friable wounds in this area. He is using his abdominal binder. 12/3; the patient was in the ER with bleeding from the lower part of his incision in the abdomen on 11/28. Had a stitch placed to contain the bleeding area. This was just underneath his major wound. He saw general surgery yesterday and apparently we are to remove the stitch in 2 weeks. The patient is on Plavix and aspirin for coronary artery disease with stents. His bleeding was felt to be capillary. 12/17; I removed the stitch from the bleeding area that was placed last time. This came out with some difficulty. The major area almost looks like slough with an area of cellophane over it. I did not attempt any further debridement 02/28/2019. The patient has new areas superiorly and inferiorly to the major wound in the middle. The area in the middle has a nonviable surface over perhaps 60 to 70% however debridement causes uncontrolled bleeding here. He is on Plavix. We have been usin hydrogel and Prisma. 1/21; the patient has 2 superficial areas superiorly he has the major wound in the middle and a small area inferiorly in the middle of this abdominal wound. We switch to Iodoflex last time Electronic Signature(s) Signed: 03/14/2019 5:54:58 PM By: Linton Ham MD Entered By: Linton Ham on 03/14/2019 15:55:03 -------------------------------------------------------------------------------- Chemical Cauterization Details Patient Name: Date of Service: Larry [name] 03/14/2019 1:30 PM Medical Record  [id number] Patient Account Number: [id number] Date of Birth/Sex: Treating RN: [birth year] (69 y.o. M) Primary Care Provider: Lin Landsman Other Clinician: Referring Provider: Treating Provider/Extender:Niccole Witthuhn, Fernanda Drum, [name] in Treatment: 9 Procedure Performed for: Wound #10 Proximal Abdomen - midline Performed By: Physician Ricard Dillon., MD Post Procedure Diagnosis Same as Pre-procedure Notes silver nitrate. Electronic Signature(s) Signed: 03/14/2019 5:54:58 PM By: Linton Ham MD Entered By: Linton Ham on 03/14/2019 15:52:38 -------------------------------------------------------------------------------- Chemical Cauterization Details Patient Name: Date of Service: Larry [name] 03/14/2019 1:30 PM Medical Record [id number] Patient Account Number: [id number] Date of Birth/Sex: Treating RN: [birth year] (69 y.o. M) Primary Care Provider: Lin Landsman Other Clinician: Referring Provider: Treating Provider/Extender:Jaidin Richison, Fernanda Drum, [name] in Treatment: 9 Procedure Performed for: Wound #8 Abdomen - midline Performed By: Physician Ricard Dillon., MD Post Procedure Diagnosis Same as Pre-procedure Notes silver nitrate. Electronic Signature(s) Signed: 03/14/2019 5:54:58 PM By: Linton Ham MD Entered By: Linton Ham on 03/14/2019 15:52:47 -------------------------------------------------------------------------------- Chemical Cauterization Details Patient Name: Date of Service: Larry [name] 03/14/2019 1:30 PM Medical Record [id number] Patient Account Number: [id number] Date of Birth/Sex: Treating RN: 1950/10/02 (69 y.o. M) Primary Care Provider: Lin Landsman Other Clinician: Referring Provider: Treating Provider/Extender:Berklee Battey, Fernanda Drum, [name] in Treatment: 9 Procedure  Performed for: Wound #9 Distal Abdomen - midline Performed By: Physician Ricard Dillon., MD Post Procedure  Diagnosis Same as Pre-procedure Notes silver nitrate. Electronic Signature(s) Signed: 03/14/2019 5:54:58 PM By: Linton Ham MD Entered By: Linton Ham on 03/14/2019 15:52:55 -------------------------------------------------------------------------------- Physical Exam Details Patient Name: Date of Service: TRAMANE, WERNICKE 03/14/2019 1:30 PM Medical Record [id number] Patient Account Number: [id number] Date of Birth/Sex: Treating RN: 1950/11/03 (69 y.o. M) Primary Care Provider: Lin Landsman Other Clinician: Referring Provider: Treating Provider/Extender:Ilea Hilton, Fernanda Drum, [name] in Treatment: 9 Constitutional Sitting or standing Blood Pressure is within target range for patient.. Pulse regular and within target range for patient.[name] Kitchen Respirations regular, non-labored and within target range.. Temperature is normal and within the target range for the patient.[name] Kitchen Appears in no distress. Notes Wound exam; area in the mid part of his abdomen. This was debrided with silver nitrate as were the hyper granulated areas x2 superiorly and x1 inferiorly. The mid abdominal wound had a surface on it which I could not see is a healed surface. This may have however had a slight rim of epithelialization over the top of it Electronic Signature(s) Signed: 03/14/2019 5:54:58 PM By: Linton Ham MD Entered By: Linton Ham on 03/14/2019 15:56:28 -------------------------------------------------------------------------------- Physician Orders Details Patient Name: Date of Service: Larry [name] 03/14/2019 1:30 PM Medical Record [id number] Patient Account Number: [id number] Date of Birth/Sex: Treating RN: [birth year] (69 y.o. [name] Primary Care Provider: Lin Landsman Other Clinician: Referring Provider: Treating Provider/Extender:Panayiotis Rain[place], Fernanda Drum, [name] in Treatment: 9 Verbal / Phone Orders: No Diagnosis Coding ICD-10 Coding Code  Description Disruption of external operation (surgical) wound, not elsewhere classified, subsequent T81.31XD encounter Unspecified open wound of abdominal wall, periumbilic region without penetration into peritoneal S31.105S cavity, sequela Follow-up Appointments Return Appointment in 2 weeks. Dressing Change Frequency Change Dressing every other day. Wound Cleansing May shower and wash wound with soap and water. - with dressing change Primary Wound Dressing Wound #10 Proximal Abdomen - midline Iodoflex Wound #8 Abdomen - midline Iodoflex Wound #9 Distal Abdomen - midline Iodoflex Secondary Dressing Dry Gauze ABD pad Other: - abdominal binder while up during the day. may remove at night. Electronic Signature(s) Signed: 03/14/2019 5:54:58 PM By: Linton Ham MD Signed: 03/14/2019 6:20:01 PM By: Deon Pilling Entered By: Deon Pilling on 03/14/2019 15:43:24 -------------------------------------------------------------------------------- Problem List Details Patient Name: Date of Service: KAIYAN, LAING 03/14/2019 1:30 PM Medical Record [id number] Patient Account Number: [id number] Date of Birth/Sex: Treating RN: 10/17/1950 (68 y.o. [name] Primary Care Provider: Lin Landsman Other Clinician: Referring Provider: Treating Provider/Extender:Gabriele Loveland, Fernanda Drum, [name] in Treatment: 9 Active Problems ICD-10 Evaluated Encounter Code Description Active Date Today Diagnosis T81.31XD Disruption of external operation (surgical) wound, not 01/04/2019 No Yes elsewhere classified, subsequent encounter S31.105S Unspecified open wound of abdominal wall, [id number] No Yes periumbilic region without penetration into peritoneal cavity, sequela Inactive Problems Resolved Problems Electronic Signature(s) Signed: 03/14/2019 5:54:58 PM By: Linton Ham MD Entered By: Linton Ham on 03/14/2019  15:52:10 -------------------------------------------------------------------------------- Progress Note Details Patient Name: Date of Service: Larry [name] 03/14/2019 1:30 PM Medical Record [id number] Patient Account Number: [id number] Date of Birth/Sex: Treating RN: [birth year] (69 y.o. M) Primary Care Provider: Lin Landsman Other Clinician: Referring Provider: Treating Provider/Extender:Pailynn Vahey, Fernanda Drum, [name] in Treatment: 9 Subjective History of Present Illness (HPI) The following HPI elements were documented for the patient's wound: Location: Patient presents with a wound to left lower leg. Quality: Patient reports No Pain. Severity: Patient states wound(s) are getting  worse. Duration: Patient has had the wound for < 2 weeks prior to presenting for treatment Modifying Factors: pvd pt smoked until 5 years ago. cancer surgery 1 year ago. has ruq stoma. cv studies last year revealed non- compressible vessels. recent pe by pcp yielded some heart abnormality and he is scheduled for stress test. 5/24 2 new wounds on rt leg. left side healed 08/12/14; several small wounds on the right lateral leg and one wound on the left leg which is new. The edema control here does not look adequate. There is also some degree of maceration around the wounds on the right lateral leg READMISSION 09/14/2018 This is a patient we had in this clinic in 2013 and then again in 2016 with wounds on his right lateral leg secondary to chronic venous insufficiency. He was cared for I believe in 2016 by Dr. Lindon Romp. The patient is here for review of 6 small wounds within a large surgical area on his abdomen. The patient had surgery for a malignant neoplasm of the sigmoid colon with a colostomy. I cannot see the date of the exact surgery in epic. In February 2018 he had an attempted colostomy reversal however he had a microperforation I believe at the anastomosis. He required a repeat  laparotomy. He had an ileostomy raised at that time. I believe the surgical wound at that time was left to close via secondary intention. The patient is unaware whether he had a wound VAC on this. In any case this almost is completely closed except he has been left with 6 small eraser head sized areas on the abdomen that have not healed. I am not exactly sure how he was how he is been dressing this. He has been followed with frequent visits in general surgery dating back at least over a year. Past medical history includes chronic systolic heart failure, stage III chronic renal failure. Malignant neoplasm of the sigmoid colon, morbid obesity. 7/31; not a lot of change 6 small wounds within a large surgical area on his abdomen. None of these are hyper granulated as opposed to last week. None of them have exposed mesh or surrounding infection. He has been using [facility] 8/14- He comes after 2 weeks, he has only 2 open wounds that I can tell this time, he has been using Hydrofera Blue 8/28; 2-week follow-up. Very superficial areas on the abdominal surgical site scar. He is using Hydrofera Blue and this seems to be doing a good job. He has not made it down to the medical supply store to see if he can get an abdominal binder or something to keep the tension off this wound area. READMISSION 01/04/2019 This is a 69 year old man we had in the clinic from July through August 2020. He had wounds in the midline abdominal scar. [Please see history from my note of 09/14/2018}. When he was here last time we used Hydrofera Blue. Things seem to be improving. He did not discharge in a healed state his last visit here was on 8/28. He states he continued to use the [facility] and at 1 point this was very close to healing but more recently it is reopened. The wounds are basically in the same state as last time superficial friable easily bleeding wounds. Some hyper granulation. He has an abdominal binder  that he got at [facility] however he says it is too tight for him to get on. Past medical history is reviewed; he has coronary artery disease, stage III chronic renal  failure heart failure with reduced ejection fraction, COPD, hypertension, obstructive sleep apnea. He was hospitalized since he was here last time for a severe pharyngitis. 11/19; the patient was in the ER on 11/16; this was for oozing from his abdominal wound. He was cauterized with quick clot gauze and epi and silver nitrate. This was felt to be venous oozing. He has very friable wounds in this area. He is using his abdominal binder. 12/3; the patient was in the ER with bleeding from the lower part of his incision in the abdomen on 11/28. Had a stitch placed to contain the bleeding area. This was just underneath his major wound. He saw general surgery yesterday and apparently we are to remove the stitch in 2 weeks. The patient is on Plavix and aspirin for coronary artery disease with stents. His bleeding was felt to be capillary. 12/17; I removed the stitch from the bleeding area that was placed last time. This came out with some difficulty. The major area almost looks like slough with an area of cellophane over it. I did not attempt any further debridement 02/28/2019. The patient has new areas superiorly and inferiorly to the major wound in the middle. The area in the middle has a nonviable surface over perhaps 60 to 70% however debridement causes uncontrolled bleeding here. He is on Plavix. We have been usin hydrogel and Prisma. 1/21; the patient has 2 superficial areas superiorly he has the major wound in the middle and a small area inferiorly in the middle of this abdominal wound. We switch to Iodoflex last time Objective Constitutional Sitting or standing Blood Pressure is within target range for patient.. Pulse regular and within target range for patient.[name] Kitchen Respirations regular, non-labored and within target range..  Temperature is normal and within the target range for the patient.[name] Kitchen Appears in no distress. Vitals Time Taken: 2:30 PM, Height: 71 in, Weight: 344 lbs, BMI: 48, Temperature: 97.7 F, Pulse: 109 bpm, Respiratory Rate: 19 breaths/min, Blood Pressure: 123/73 mmHg. General Notes: Wound exam; area in the mid part of his abdomen. This was debrided with silver nitrate as were the hyper granulated areas x2 superiorly and x1 inferiorly. The mid abdominal wound had a surface on it which I could not see is a healed surface. This may have however had a slight rim of epithelialization over the top of it Integumentary (Hair, Skin) Wound #10 status is Open. Original cause of wound was Gradually Appeared. The wound is located on the Proximal Abdomen - midline. The wound measures 2cm length x 2.2cm width x 0.1cm depth; 3.456cm^2 area and 0.346cm^3 volume. There is Fat Layer (Subcutaneous Tissue) Exposed exposed. There is no tunneling or undermining noted. There is a small amount of serosanguineous drainage noted. The wound margin is distinct with the outline attached to the wound base. There is large (67-100%) pink granulation within the wound bed. There is no necrotic tissue within the wound bed. Wound #8 status is Open. Original cause of wound was Surgical Injury. The wound is located on the Abdomen - midline. The wound measures 2.5cm length x 2cm width x 0.1cm depth; 3.927cm^2 area and 0.393cm^3 volume. There is Fat Layer (Subcutaneous Tissue) Exposed exposed. There is no tunneling or undermining noted. There is a medium amount of serosanguineous drainage noted. The wound margin is flat and intact. There is large (67-100%) red, pink granulation within the wound bed. There is no necrotic tissue within the wound bed. Wound #9 status is Open. Original cause of wound was Gradually Appeared.  The wound is located on the Distal Abdomen - midline. The wound measures 0.6cm length x 0.6cm width x 0.1cm depth; 0.283cm^2  area and 0.028cm^3 volume. There is Fat Layer (Subcutaneous Tissue) Exposed exposed. There is no tunneling or undermining noted. There is a small amount of serosanguineous drainage noted. The wound margin is distinct with the outline attached to the wound base. There is large (67-100%) red, pink granulation within the wound bed. There is no necrotic tissue within the wound bed. Assessment Active Problems ICD-10 Disruption of external operation (surgical) wound, not elsewhere classified, subsequent encounter Unspecified open wound of abdominal wall, periumbilic region without penetration into peritoneal cavity, sequela Procedures Wound #10 Pre-procedure diagnosis of Wound #10 is an Open Surgical Wound located on the Proximal Abdomen - midline . An Chemical Cauterization procedure was performed by Ricard Dillon., MD. Post procedure Diagnosis Wound #10: Same as Pre-Procedure Notes: silver nitrate. Wound #8 Pre-procedure diagnosis of Wound #8 is an Open Surgical Wound located on the Abdomen - midline . An Chemical Cauterization procedure was performed by Ricard Dillon., MD. Post procedure Diagnosis Wound #8: Same as Pre-Procedure Notes: silver nitrate. Wound #9 Pre-procedure diagnosis of Wound #9 is an Open Surgical Wound located on the Distal Abdomen - midline . An Chemical Cauterization procedure was performed by Ricard Dillon., MD. Post procedure Diagnosis Wound #9: Same as Pre-Procedure Notes: silver nitrate. Plan Follow-up Appointments: Return Appointment in 2 weeks. Dressing Change Frequency: Change Dressing every other day. Wound Cleansing: May shower and wash wound with soap and water. - with dressing change Primary Wound Dressing: Wound #10 Proximal Abdomen - midline: Iodoflex Wound #8 Abdomen - midline: Iodoflex Wound #9 Distal Abdomen - midline: Iodoflex Secondary Dressing: Dry Gauze ABD pad Other: - abdominal binder while up during the day. may remove at  night. 1. Iodoflex to all the wound areas/ABDs Electronic Signature(s) Signed: 03/14/2019 5:54:58 PM By: Linton Ham MD Entered By: Linton Ham on 03/14/2019 15:57:01 -------------------------------------------------------------------------------- SuperBill Details Patient Name: Date of Service: Larry [name] 03/14/2019 Medical Record [id number] Patient Account Number: [id number] Date of Birth/Sex: Treating RN: [birth year] (68 y.o. Lorette Ang, Meta.Reding Primary Care Provider: Lin Landsman Other Clinician: Referring Provider: Treating Provider/Extender:Masaki Rothbauer, Fernanda Drum, [name] in Treatment: 9 Diagnosis Coding ICD-10 Codes Code Description Disruption of external operation (surgical) wound, not elsewhere classified, subsequent T81.31XD encounter Unspecified open wound of abdominal wall, periumbilic region without penetration into peritoneal S31.105S cavity, sequela Facility Procedures The patient participates with Medicare or their insurance follows the Medicare Facility Guidelines: CPT4 Description Modifier Quantity Code [id number] - CHEM CAUT GRANULATION TISS 3 ICD-10 Diagnosis Description T81.31XD Disruption of external  operation (surgical) wound, not elsewhere classified, subsequent encounter S31.105S Unspecified open wound of abdominal wall, periumbilic region without penetration into peritoneal cavity, sequela Physician Procedures CPT4: Code [id number] Description: [id number] - WC PHYS CHEM CAUT GRAN TISSUE ICD-10 Diagnosis Description T81.31XD Disruption of external operation (surgical) wound, not elsew subsequent encounter S31.105S Unspecified open wound of abdominal wall, periumbilic region into  peritoneal cavity, sequela Modifier: here classif without pen Quantity: 3 ied, etration Electronic Signature(s) Signed: 03/22/2019 5:55:42 PM By: Linton Ham MD Signed: 04/18/2019 2:24:55 PM By: Levan Hurst RN, BSN Previous Signature: 03/14/2019 5:54:58  PM Version By: Linton Ham MD Entered By: Levan Hurst on 03/22/2019 09:10:32

## 2019-03-14 NOTE — Progress Notes (Signed)
[name], [name] ([id number]) Visit Report for 03/14/2019 Arrival Information Details Patient Name: Date of Service: Larry [name], Larry [name] 03/14/2019 1:30 PM Medical Record [id number] Patient Account Number: [id number] Date of Birth/Sex: Treating RN: 04/30/1950 (69 y.o. [name] Primary Care Deavon Podgorski: Lin Landsman Other Clinician: Referring Samamtha Tiegs: Treating Kincade Granberg/Extender:Robson, Fernanda Drum, [name] in Treatment: 9 Visit Information History Since Last Visit Added or deleted any medications: No Patient Arrived: Ambulatory Any new allergies or adverse reactions: No Arrival Time: 14:38 Had a fall or experienced change in No Accompanied By: self activities of daily living that may affect Transfer Assistance: None risk of falls: Patient Identification Verified: Yes Signs or symptoms of abuse/neglect since last No Secondary Verification Process Completed: Yes visito Patient Requires Transmission-Based No Hospitalized since last visit: No Precautions: Implantable device outside of the clinic excluding No Patient Has Alerts: No cellular tissue based products placed in the center since last visit: Has Dressing in Place as Prescribed: Yes Pain Present Now: No Electronic Signature(s) Signed: 03/14/2019 6:15:07 PM By: Kela Millin Entered By: Kela Millin on 03/14/2019 14:38:38 -------------------------------------------------------------------------------- Encounter Discharge Information Details Patient Name: Date of Service: Larry [name] 03/14/2019 1:30 PM Medical Record [id number] Patient Account Number: [id number] Date of Birth/Sex: Treating RN: [birth year] (69 y.o. [name] Primary Care Felipa Laroche: Lin Landsman Other Clinician: Referring Omarie Parcell: Treating Miliani Deike/Extender:Robson, Fernanda Drum, [name] in Treatment: 9 Encounter Discharge Information Items Discharge Condition: Stable Ambulatory Status:  Ambulatory Discharge Destination: Home Transportation: Private Auto Accompanied By: self Schedule Follow-up Appointment: Yes Clinical Summary of Care: Patient Declined Electronic Signature(s) Signed: 03/14/2019 5:58:49 PM By: Baruch Gouty RN, BSN Entered By: Baruch Gouty on 03/14/2019 15:50:59 -------------------------------------------------------------------------------- Lower Extremity Assessment Details Patient Name: Date of Service: Larry [name], Larry [name] 03/14/2019 1:30 PM Medical Record [id number] Patient Account Number: [id number] Date of Birth/Sex: Treating RN: 1950/07/29 (68 y.o. [name] Primary Care Lamoine Magallon: Lin Landsman Other Clinician: Referring Lataya Varnell: Treating Virlee Stroschein/Extender:Robson, Fernanda Drum, [name] in Treatment: 9 Electronic Signature(s) Signed: 03/14/2019 6:15:07 PM By: Kela Millin Entered By: Kela Millin on 03/14/2019 14:39:53 -------------------------------------------------------------------------------- Multi Wound Chart Details Patient Name: Date of Service: Larry [name] 03/14/2019 1:30 PM Medical Record [id number] Patient Account Number: [id number] Date of Birth/Sex: Treating RN: 1950/06/09 (69 y.o. M) Primary Care Amadou Katzenstein: Lin Landsman Other Clinician: Referring Maaliyah Adolph: Treating Jacquita Mulhearn/Extender:Robson, Fernanda Drum, [name] in Treatment: 9 Vital Signs Height(in): 71 Pulse(bpm): 109 Weight(lbs): 344 Blood Pressure(mmHg): 123/73 Body Mass Index(BMI): 48 Temperature(F): 97.7 Respiratory 19 Rate(breaths/min): Photos: [10:No Photos] [8:No Photos] [9:No Photos] Wound Location: [10:Abdomen - midline - Proximal] [8:Abdomen - midline] [9:Abdomen - midline - Distal] Wounding Event: [10:Gradually Appeared] [8:Surgical Injury] [9:Gradually Appeared] Primary Etiology: [10:Open Surgical Wound] [8:Open Surgical Wound] [9:Open Surgical Wound] Comorbid History: [10:Asthma, Chronic  Obstructive Pulmonary Disease (COPD), Sleep Apnea, Arrhythmia, Congestive Heart Failure, Congestive Heart Failure, Congestive Heart Failure, Coronary Artery Disease, Coronary Artery Disease, Coronary Artery Disease,  Hypertension, Peripheral Hypertension, Peripheral Hypertension, Peripheral Arterial Disease, Peripheral Arterial Disease, Peripheral Arterial Disease, Peripheral Venous Disease] [8:Asthma, Chronic Obstructive Pulmonary Disease (COPD), Sleep Apnea,  Arrhythmia, Venous Disease] [9:Asthma, Chronic Obstructive Pulmonary Disease (COPD), Sleep Apnea, Arrhythmia, Venous Disease] Date Acquired: [10:02/28/2019] [8:02/21/2017] [9:02/28/2019] Weeks of Treatment: [10:2] [8:9] [9:2] Wound Status: [10:Open] [8:Open] [9:Open] Clustered Wound: [10:Yes] [8:Yes] [9:No] Clustered Quantity: [10:2] [8:N/A] [9:N/A] Measurements L x W x D 2x2.2x0.1 [8:2.5x2x0.1] [9:0.6x0.6x0.1] (cm) Area (cm) : [10:3.456] [8:3.927] [9:0.283] Volume (cm) : [10:0.346] [8:0.393] [9:0.028] % Reduction in Area: [10:-3576.60%] [8:95.10%] [9:70.00%] % Reduction in Volume: -3744.40% [8:95.10%] [9:70.20%] Classification: [10:Full Thickness  Without Exposed Support Structures Exposed Support Structures Exposed Support Structures] [8:Full Thickness Without] [9:Full Thickness Without] Exudate Amount: [10:Small] [8:Medium] [9:Small] Exudate Type: [10:Serosanguineous] [8:Serosanguineous] [9:Serosanguineous] Exudate Color: [10:red, brown] [8:red, brown] [9:red, brown] Wound Margin: [10:Distinct, outline attached Flat and Intact] [9:Distinct, outline attached] Granulation Amount: [10:Large (67-100%)] [8:Large (67-100%)] [9:Large (67-100%)] Granulation Quality: [10:Pink] [8:Red, Pink] [9:Red, Pink] Necrotic Amount: [10:None Present (0%)] [8:None Present (0%)] [9:None Present (0%)] Exposed Structures: [10:Fat Layer (Subcutaneous Fat Layer (Subcutaneous Fat Layer (Subcutaneous Tissue) Exposed: Yes Fascia: No Tendon: No Muscle: No Joint: No  Bone: No] [8:Tissue) Exposed: Yes Fascia: No Tendon: No Muscle: No Joint: No Bone: No] [9:Tissue) Exposed: Yes  Fascia: No Tendon: No Muscle: No Joint: No Bone: No] Epithelialization: [10:None] [8:Small (1-33%) Chemical Cauterization] [9:None Chemical Cauterization] Treatment Notes Wound #10 (Proximal Abdomen - midline) 3. Primary Dressing Applied Iodoflex 4. Secondary Dressing ABD Pad 5. Secured With Tape Wound #8 (Abdomen - midline) 3. Primary Dressing Applied Iodoflex 4. Secondary Dressing ABD Pad 5. Secured With Tape Wound #9 (Distal Abdomen - midline) 3. Primary Dressing Applied Iodoflex 4. Secondary Dressing ABD Pad 5. Secured With Recruitment consultant) Signed: 03/14/2019 5:54:58 PM By: Linton Ham MD Entered By: Linton Ham on 03/14/2019 15:52:28 -------------------------------------------------------------------------------- McConnell Details Patient Name: Date of Service: HARAN, PETSCH 03/14/2019 1:30 PM Medical Record [id number] Patient Account Number: [id number] Date of Birth/Sex: Treating RN: 1950/02/26 (69 y.o. [name] Primary Care Shaun Zuccaro: Lin Landsman Other Clinician: Referring Tilia Faso: Treating Malosi Hemstreet/Extender:Robson, Fernanda Drum, [name] in Treatment: 9 Active Inactive Wound/Skin Impairment Nursing Diagnoses: Impaired tissue integrity Goals: Ulcer/skin breakdown will have a volume reduction of 30% by week 4 Date Initiated: 01/04/2019 Target Resolution Date: 04/05/2019 Goal Status: Active Interventions: Provide education on ulcer and skin care Notes: Electronic Signature(s) Signed: 03/14/2019 6:20:01 PM By: Deon Pilling Entered By: Deon Pilling on 03/14/2019 15:35:33 -------------------------------------------------------------------------------- Pain Assessment Details Patient Name: Date of Service: NAVEEN, PETRY 03/14/2019 1:30 PM Medical Record [id number]  Patient Account Number: [id number] Date of Birth/Sex: Treating RN: 09-28-1950 (69 y.o. [name] Primary Care Raylen Tangonan: Lin Landsman Other Clinician: Referring Reagyn Facemire: Treating Daijon Wenke/Extender:Robson, Fernanda Drum, [name] in Treatment: 9 Active Problems Location of Pain Severity and Description of Pain Patient Has Paino No Site Locations Pain Management and Medication Current Pain Management: Electronic Signature(s) Signed: 03/14/2019 6:15:07 PM By: Kela Millin Entered By: Kela Millin on 03/14/2019 14:39:46 -------------------------------------------------------------------------------- Patient/Caregiver Education Details Patient Name: Date of Service: Larry [name] 1/21/2021andnbsp1:30 PM Medical Record Patient Account Number: [id number] Number: Treating RN: Deon Pilling Date of Birth/Gender: Jun 08, 1950 (68 y.o. M) Other Clinician: Primary Care Physician: Lin Landsman Treating Linton Ham Referring Physician: Physician/Extender: Katharine Look in Treatment: 9 Education Assessment Education Provided To: Patient Education Topics Provided Wound/Skin Impairment: Handouts: Skin Care Do's and Dont's Methods: Explain/Verbal Responses: Reinforcements needed Electronic Signature(s) Signed: 03/14/2019 6:20:01 PM By: Deon Pilling Entered By: Deon Pilling on 03/14/2019 15:35:44 -------------------------------------------------------------------------------- Wound Assessment Details Patient Name: Date of Service: Larry [name], Larry [name] 03/14/2019 1:30 PM Medical Record [id number] Patient Account Number: [id number] Date of Birth/Sex: Treating RN: 09/27/1950 (68 y.o. [name] Primary Care Laketra Bowdish: Lin Landsman Other Clinician: Referring Lukis Bunt: Treating Steffany Schoenfelder/Extender:Robson, Fernanda Drum, [name] in Treatment: 9 Wound Status Wound Number: 10 Primary Open Surgical Wound Etiology: Wound  Location: Abdomen - midline - Proximal Wound Open Wounding Event: Gradually Appeared Status: Date Acquired: 02/28/2019 Comorbid Asthma, Chronic Obstructive Pulmonary Weeks Of Treatment: 2 History: Disease (COPD), Sleep Apnea, Arrhythmia, Clustered Wound: Yes Congestive Heart Failure,  Coronary Artery Disease, Hypertension, Peripheral Arterial Disease, Peripheral Venous Disease Wound Measurements Length: (cm) 2 % Reduct Width: (cm) 2.2 % Reduct Depth: (cm) 0.1 Epitheli Clustered Quantity: 2 Tunnelin Area: (cm) 3.456 Undermi Volume: (cm) 0.346 Wound Description Full Thickness Without Exposed Support Foul Odo Classification: Structures Slough/F Wound Distinct, outline attached Margin: Exudate Small Amount: Exudate Serosanguineous Type: Exudate red, brown Color: Wound Bed Granulation Amount: Large (67-100%) Granulation Quality: Pink Fascia E Necrotic Amount: None Present (0%) Fat Laye Tendon E Muscle E Joint Ex Bone Exp r After Cleansing: No ibrino No Exposed Structure xposed: No r (Subcutaneous Tissue) Exposed: Yes xposed: No xposed: No posed: No osed: No ion in Area: -3576.6% ion in Volume: -3744.4% alization: None g: No ning: No Treatment Notes Wound #10 (Proximal Abdomen - midline) 3. Primary Dressing Applied Iodoflex 4. Secondary Dressing ABD Pad 5. Secured With Recruitment consultant) Signed: 03/14/2019 6:15:07 PM By: Kela Millin Entered By: Kela Millin on 03/14/2019 14:44:20 -------------------------------------------------------------------------------- Wound Assessment Details Patient Name: Date of Service: Larry [name], Larry [name] 03/14/2019 1:30 PM Medical Record [id number] Patient Account Number: [id number] Date of Birth/Sex: Treating RN: 10/29/1950 (69 y.o. [name] Primary Care Saumya Hukill: Lin Landsman Other Clinician: Referring Chivon Lepage: Treating Lidiya Reise/Extender:Robson, Fernanda Drum, [name] in  Treatment: 9 Wound Status Wound Number: 8 Primary Open Surgical Wound Etiology: Wound Location: Abdomen - midline Wound Open Wounding Event: Surgical Injury Status: Date Acquired: 02/21/2017 Comorbid Asthma, Chronic Obstructive Pulmonary Weeks Of Treatment: 9 History: Disease (COPD), Sleep Apnea, Arrhythmia, Clustered Wound: Yes Congestive Heart Failure, Coronary Artery Disease, Hypertension, Peripheral Arterial Disease, Peripheral Venous Disease Wound Measurements Length: (cm) 2.5 % Reduct Width: (cm) 2 % Reduct Depth: (cm) 0.1 Epitheli Area: (cm) 3.927 Tunneli Volume: (cm) 0.393 Undermi Wound Description Full Thickness Without Exposed Support Foul Odo Classification: Structures Slough/F Wound Flat and Intact Margin: Exudate Medium Amount: Exudate Serosanguineous Type: Exudate red, brown Color: Wound Bed Granulation Amount: Large (67-100%) Granulation Quality: Red, Pink Fascia Exp Necrotic Amount: None Present (0%) Fat Layer Tendon Exp Muscle Exp Joint Expo Bone Expos r After Cleansing: No ibrino No Exposed Structure osed: No (Subcutaneous Tissue) Exposed: Yes osed: No osed: No sed: No ed: No ion in Area: 95.1% ion in Volume: 95.1% alization: Small (1-33%) ng: No ning: No Treatment Notes Wound #8 (Abdomen - midline) 3. Primary Dressing Applied Iodoflex 4. Secondary Dressing ABD Pad 5. Secured With Recruitment consultant) Signed: 03/14/2019 6:15:07 PM By: Kela Millin Entered By: Kela Millin on 03/14/2019 14:45:20 -------------------------------------------------------------------------------- Wound Assessment Details Patient Name: Date of Service: Larry [name], Larry [name] 03/14/2019 1:30 PM Medical Record [id number] Patient Account Number: [id number] Date of Birth/Sex: Treating RN: 26-Aug-1950 (69 y.o. [name] Primary Care Mayo Owczarzak: Lin Landsman Other Clinician: Referring Ardith Lewman: Treating  Wanita Derenzo/Extender:Robson, Fernanda Drum, [name] in Treatment: 9 Wound Status Wound Number: 9 Primary Open Surgical Wound Etiology: Wound Location: Abdomen - midline - Distal Wound Open Wounding Event: Gradually Appeared Status: Date Acquired: 02/28/2019 Comorbid Asthma, Chronic Obstructive Pulmonary Weeks Of Treatment: 2 History: Disease (COPD), Sleep Apnea, Arrhythmia, Clustered Wound: No Congestive Heart Failure, Coronary Artery Disease, Hypertension, Peripheral Arterial Disease, Peripheral Venous Disease Wound Measurements Length: (cm) 0.6 % Reduction Width: (cm) 0.6 % Reduction Depth: (cm) 0.1 Epitheliali Area: (cm) 0.283 Tunneling: Volume: (cm) 0.028 Underminin Wound Description Full Thickness Without Exposed Support Foul Odo Classification: Structures Slough/F Wound Distinct, outline attached Margin: Exudate Small Amount: Exudate Serosanguineous Type: Exudate red, brown Color: Wound Bed Granulation Amount: Large (67-100%) Granulation Quality: Red, Pink Fascia E Necrotic Amount:  None Present (0%) Fat Laye Tendon E Muscle E Joint Ex Bone Exp r After Cleansing: No ibrino No Exposed Structure xposed: No r (Subcutaneous Tissue) Exposed: Yes xposed: No xposed: No posed: No osed: No in Area: 70% in Volume: 70.2% zation: None No g: No Treatment Notes Wound #9 (Distal Abdomen - midline) 3. Primary Dressing Applied Iodoflex 4. Secondary Dressing ABD Pad 5. Secured With Recruitment consultant) Signed: 03/14/2019 6:15:07 PM By: Kela Millin Entered By: Kela Millin on 03/14/2019 14:44:43 -------------------------------------------------------------------------------- Vitals Details Patient Name: Date of Service: Larry [name] 03/14/2019 1:30 PM Medical Record [id number] Patient Account Number: [id number] Date of Birth/Sex: Treating RN: January 21, 1951 (69 y.o. [name] Primary Care Justn Quale: Lin Landsman Other Clinician: Referring Olayinka Gathers: Treating Brandan Robicheaux/Extender:Robson, Fernanda Drum, [name] in Treatment: 9 Vital Signs Time Taken: 14:30 Temperature (F): 97.7 Height (in): 71 Pulse (bpm): 109 Weight (lbs): 344 Respiratory Rate (breaths/min): 19 Body Mass Index (BMI): 48 Blood Pressure (mmHg): 123/73 Reference Range: 80 - 120 mg / dl Electronic Signature(s) Signed: 03/14/2019 6:15:07 PM By: Kela Millin Entered By: Kela Millin on 03/14/2019 14:39:41

## 2019-03-28 ENCOUNTER — Encounter (HOSPITAL_BASED_OUTPATIENT_CLINIC_OR_DEPARTMENT_OTHER): Payer: Medicare Other | Attending: Internal Medicine | Admitting: Internal Medicine

## 2019-03-28 ENCOUNTER — Other Ambulatory Visit: Payer: Self-pay

## 2019-03-28 DIAGNOSIS — I5022 Chronic systolic (congestive) heart failure: Secondary | ICD-10-CM | POA: Insufficient documentation

## 2019-03-28 DIAGNOSIS — I130 Hypertensive heart and chronic kidney disease with heart failure and stage 1 through stage 4 chronic kidney disease, or unspecified chronic kidney disease: Secondary | ICD-10-CM | POA: Insufficient documentation

## 2019-03-28 DIAGNOSIS — N183 Chronic kidney disease, stage 3 unspecified: Secondary | ICD-10-CM | POA: Diagnosis not present

## 2019-03-28 DIAGNOSIS — Z86711 Personal history of pulmonary embolism: Secondary | ICD-10-CM | POA: Insufficient documentation

## 2019-03-28 DIAGNOSIS — C187 Malignant neoplasm of sigmoid colon: Secondary | ICD-10-CM | POA: Diagnosis not present

## 2019-03-28 DIAGNOSIS — Z933 Colostomy status: Secondary | ICD-10-CM | POA: Insufficient documentation

## 2019-03-28 DIAGNOSIS — I2510 Atherosclerotic heart disease of native coronary artery without angina pectoris: Secondary | ICD-10-CM | POA: Insufficient documentation

## 2019-03-28 DIAGNOSIS — Z6841 Body Mass Index (BMI) 40.0 and over, adult: Secondary | ICD-10-CM | POA: Diagnosis not present

## 2019-03-28 DIAGNOSIS — S81802A Unspecified open wound, left lower leg, initial encounter: Secondary | ICD-10-CM | POA: Diagnosis not present

## 2019-03-28 DIAGNOSIS — I739 Peripheral vascular disease, unspecified: Secondary | ICD-10-CM | POA: Diagnosis not present

## 2019-03-28 DIAGNOSIS — J449 Chronic obstructive pulmonary disease, unspecified: Secondary | ICD-10-CM | POA: Insufficient documentation

## 2019-03-28 DIAGNOSIS — Z955 Presence of coronary angioplasty implant and graft: Secondary | ICD-10-CM | POA: Diagnosis not present

## 2019-03-28 DIAGNOSIS — Z7902 Long term (current) use of antithrombotics/antiplatelets: Secondary | ICD-10-CM | POA: Insufficient documentation

## 2019-03-28 DIAGNOSIS — T8189XA Other complications of procedures, not elsewhere classified, initial encounter: Secondary | ICD-10-CM | POA: Diagnosis not present

## 2019-03-28 DIAGNOSIS — I872 Venous insufficiency (chronic) (peripheral): Secondary | ICD-10-CM | POA: Insufficient documentation

## 2019-03-28 DIAGNOSIS — G4733 Obstructive sleep apnea (adult) (pediatric): Secondary | ICD-10-CM | POA: Diagnosis not present

## 2019-03-28 DIAGNOSIS — Z7982 Long term (current) use of aspirin: Secondary | ICD-10-CM | POA: Diagnosis not present

## 2019-03-28 NOTE — Progress Notes (Signed)
Lunt, Michaell L. ([id number]) Visit Report for 03/28/2019 HPI Details Patient Name: Date of Service: Larry [name], Larry [name] 03/28/2019 1:45 PM Medical Record [id number] Patient Account Number: [id number] Date of Birth/Sex: Treating RN: 1950-05-27 (69 y.o. M) Primary Care Provider: Lin Landsman Other Clinician: Referring Provider: Treating Provider/Extender:Hikeem Andersson, Fernanda Drum, [name] in Treatment: 11 History of Present Illness Location: Patient presents with a wound to left lower leg. Quality: Patient reports No Pain. Severity: Patient states wound(s) are getting worse. Duration: Patient has had the wound for < 2 weeks prior to presenting for treatment Modifying Factors: pvd HPI Description: pt smoked until 5 years ago. cancer surgery 1 year ago. has ruq stoma. cv studies last year revealed non-compressible vessels. recent pe by pcp yielded some heart abnormality and he is scheduled for stress test. 5/24 2 new wounds on rt leg. left side healed 08/12/14; several small wounds on the right lateral leg and one wound on the left leg which is new. The edema control here does not look adequate. There is also some degree of maceration around the wounds on the right lateral leg READMISSION 09/14/2018 This is a patient we had in this clinic in 2013 and then again in 2016 with wounds on his right lateral leg secondary to chronic venous insufficiency. He was cared for I believe in 2016 by Dr. Lindon Romp. The patient is here for review of 6 small wounds within a large surgical area on his abdomen. The patient had surgery for a malignant neoplasm of the sigmoid colon with a colostomy. I cannot see the date of the exact surgery in epic. In February 2018 he had an attempted colostomy reversal however he had a microperforation I believe at the anastomosis. He required a repeat laparotomy. He had an ileostomy raised at that time. I believe the surgical wound at that time was left to close via  secondary intention. The patient is unaware whether he had a wound VAC on this. In any case this almost is completely closed except he has been left with 6 small eraser head sized areas on the abdomen that have not healed. I am not exactly sure how he was how he is been dressing this. He has been followed with frequent visits in general surgery dating back at least over a year. Past medical history includes chronic systolic heart failure, stage III chronic renal failure. Malignant neoplasm of the sigmoid colon, morbid obesity. 7/31; not a lot of change 6 small wounds within a large surgical area on his abdomen. None of these are hyper granulated as opposed to last week. None of them have exposed mesh or surrounding infection. He has been using [facility] 8/14- He comes after 2 weeks, he has only 2 open wounds that I can tell this time, he has been using Hydrofera Blue 8/28; 2-week follow-up. Very superficial areas on the abdominal surgical site scar. He is using Hydrofera Blue and this seems to be doing a good job. He has not made it down to the medical supply store to see if he can get an abdominal binder or something to keep the tension off this wound area. READMISSION 01/04/2019 This is a 69 year old man we had in the clinic from July through August 2020. He had wounds in the midline abdominal scar. [Please see history from my note of 09/14/2018}. When he was here last time we used Hydrofera Blue. Things seem to be improving. He did not discharge in a healed state his last visit here was on 8/28. He  states he continued to use the [facility] and at 1 point this was very close to healing but more recently it is reopened. The wounds are basically in the same state as last time superficial friable easily bleeding wounds. Some hyper granulation. He has an abdominal binder that he got at [facility] however he says it is too tight for him to get on. Past medical history is reviewed; he  has coronary artery disease, stage III chronic renal failure heart failure with reduced ejection fraction, COPD, hypertension, obstructive sleep apnea. He was hospitalized since he was here last time for a severe pharyngitis. 11/19; the patient was in the ER on 11/16; this was for oozing from his abdominal wound. He was cauterized with quick clot gauze and epi and silver nitrate. This was felt to be venous oozing. He has very friable wounds in this area. He is using his abdominal binder. 12/3; the patient was in the ER with bleeding from the lower part of his incision in the abdomen on 11/28. Had a stitch placed to contain the bleeding area. This was just underneath his major wound. He saw general surgery yesterday and apparently we are to remove the stitch in 2 weeks. The patient is on Plavix and aspirin for coronary artery disease with stents. His bleeding was felt to be capillary. 12/17; I removed the stitch from the bleeding area that was placed last time. This came out with some difficulty. The major area almost looks like slough with an area of cellophane over it. I did not attempt any further debridement 02/28/2019. The patient has new areas superiorly and inferiorly to the major wound in the middle. The area in the middle has a nonviable surface over perhaps 60 to 70% however debridement causes uncontrolled bleeding here. He is on Plavix. We have been usin hydrogel and Prisma. 1/21; the patient has 2 superficial areas superiorly he has the major wound in the middle and a small area inferiorly in the middle of this abdominal wound. We switch to Iodoflex last time 2/4; the patient has 3 open areas. 2 of these are on the right part of this surgical wound on his abdomen and one more on the left central. The 2 on the right are hyper granulated I used silver nitrate on these. The area on the left appears to have a fibrinous surface. I attempted to debride this once he ended up in the ER. I am  reluctant to do this again. We have been using Iodoflex Electronic Signature(s) Signed: 03/28/2019 5:50:02 PM By: Linton Ham MD Entered By: Linton Ham on 03/28/2019 15:44:52 -------------------------------------------------------------------------------- Chemical Cauterization Details Patient Name: Date of Service: Larry [name] 03/28/2019 1:45 PM Medical Record [id number] Patient Account Number: [id number] Date of Birth/Sex: Treating RN: 19-Mar-1950 (68 y.o. [name] Primary Care Provider: Lin Landsman Other Clinician: Referring Provider: Treating Provider/Extender:Quintella Mura, Fernanda Drum, [name] in Treatment: 11 Procedure Performed for: Wound #10 Proximal Abdomen - midline Performed By: Physician Ricard Dillon., MD Post Procedure Diagnosis Same as Pre-procedure Notes silver nitrate used. Electronic Signature(s) Signed: 03/28/2019 5:50:02 PM By: Linton Ham MD Signed: 03/28/2019 5:56:18 PM By: Deon Pilling Entered By: Deon Pilling on 03/28/2019 14:53:58 -------------------------------------------------------------------------------- Chemical Cauterization Details Patient Name: Date of Service: Larry [name], Larry [name] 03/28/2019 1:45 PM Medical Record [id number] Patient Account Number: [id number] Date of Birth/Sex: Treating RN: [birth year] (68 y.o. [name] Primary Care Provider: Lin Landsman Other Clinician: Referring Provider: Treating Provider/Extender:Khyleigh Furney, Fernanda Drum, [name] in Treatment: 11 Procedure  Performed for: Wound #8 Abdomen - midline Performed By: Physician Ricard Dillon., MD Post Procedure Diagnosis Same as Pre-procedure Notes silver nitrate used. Electronic Signature(s) Signed: 03/28/2019 5:50:02 PM By: Linton Ham MD Signed: 03/28/2019 5:56:18 PM By: Deon Pilling Entered By: Deon Pilling on 03/28/2019  14:54:32 -------------------------------------------------------------------------------- Chemical Cauterization Details Patient Name: Date of Service: Larry [name], Larry [name] 03/28/2019 1:45 PM Medical Record [id number] Patient Account Number: [id number] Date of Birth/Sex: Treating RN: [birth year] (68 y.o. [name] Primary Care Provider: Lin Landsman Other Clinician: Referring Provider: Treating Provider/Extender:Gloristine Turrubiates, Fernanda Drum, [name] in Treatment: 11 Procedure Performed for: Wound #9 Distal Abdomen - midline Performed By: Physician Ricard Dillon., MD Post Procedure Diagnosis Same as Pre-procedure Notes silver nitrate used. Electronic Signature(s) Signed: 03/28/2019 5:50:02 PM By: Linton Ham MD Signed: 03/28/2019 5:56:18 PM By: Deon Pilling Entered By: Deon Pilling on 03/28/2019 14:54:59 -------------------------------------------------------------------------------- Physical Exam Details Patient Name: Date of Service: Larry [name], Larry [name] 03/28/2019 1:45 PM Medical Record [id number] Patient Account Number: [id number] Date of Birth/Sex: Treating RN: 1950-08-03 (68 y.o. M) Primary Care Provider: Lin Landsman Other Clinician: Referring Provider: Treating Provider/Extender:Jerrick Farve, Fernanda Drum, [name] in Treatment: 11 Constitutional Sitting or standing Blood Pressure is within target range for patient.. Pulse regular and within target range for patient.[name] Kitchen Respirations regular, non-labored and within target range.. Temperature is normal and within the target range for the patient.[name] Kitchen Appears in no distress. Notes Wound exam; the areas in the mid part of his abdomen. The abdomen is distended his ostomy bag appears to be draining well. Again he has 2 hyper granulated areas on the right-hand side of this and one more central. Both of the hyper granulated areas on the right were cauterized with silver nitrate. The area in the left part of the  mid wound has debris over the surface of very odd surface. I attempted to debride this once before he ended up in the ER with bleeding Electronic Signature(s) Signed: 03/28/2019 5:50:02 PM By: Linton Ham MD Entered By: Linton Ham on 03/28/2019 15:49:44 -------------------------------------------------------------------------------- Physician Orders Details Patient Name: Date of Service: Larry [name], Larry [name] 03/28/2019 1:45 PM Medical Record [id number] Patient Account Number: [id number] Date of Birth/Sex: Treating RN: April 12, 1950 (69 y.o. [name] Primary Care Provider: Lin Landsman Other Clinician: Referring Provider: Treating Provider/Extender:Maritsa Hunsucker, Fernanda Drum, [name] in Treatment: 44 Verbal / Phone Orders: No Diagnosis Coding ICD-10 Coding Code Description Disruption of external operation (surgical) wound, not elsewhere classified, subsequent T81.31XD encounter Unspecified open wound of abdominal wall, periumbilic region without penetration into peritoneal S31.105S cavity, sequela Follow-up Appointments Return Appointment in 2 weeks. Dressing Change Frequency Change Dressing every other day. Wound Cleansing May shower and wash wound with soap and water. - with dressing change Primary Wound Dressing Wound #10 Proximal Abdomen - midline Iodoflex Wound #8 Abdomen - midline Iodoflex Wound #9 Distal Abdomen - midline Iodoflex Secondary Dressing Dry Gauze ABD pad Other: - abdominal binder while up during the day. may remove at night. Electronic Signature(s) Signed: 03/28/2019 5:50:02 PM By: Linton Ham MD Signed: 03/28/2019 5:56:18 PM By: Deon Pilling Entered By: Deon Pilling on 03/28/2019 14:55:19 -------------------------------------------------------------------------------- Problem List Details Patient Name: Date of Service: Larry [name], Larry [name] 03/28/2019 1:45 PM Medical Record [id number] Patient Account Number:  [id number] Date of Birth/Sex: Treating RN: 12-08-1950 (68 y.o. [name] Primary Care Provider: Lin Landsman Other Clinician: Referring Provider: Treating Provider/Extender:Jenesa Foresta, Fernanda Drum, [name] in Treatment: 11 Active Problems ICD-10 Evaluated Encounter Code Description Active Date Today Diagnosis T81.31XD Disruption of external  operation (surgical) wound, not 01/04/2019 No Yes elsewhere classified, subsequent encounter S31.105S Unspecified open wound of abdominal wall, [id number] No Yes periumbilic region without penetration into peritoneal cavity, sequela Inactive Problems Resolved Problems Electronic Signature(s) Signed: 03/28/2019 5:50:02 PM By: Linton Ham MD Entered By: Linton Ham on 03/28/2019 15:43:36 -------------------------------------------------------------------------------- Progress Note Details Patient Name: Date of Service: Larry [name] 03/28/2019 1:45 PM Medical Record [id number] Patient Account Number: [id number] Date of Birth/Sex: Treating RN: Jan 05, 1951 (69 y.o. M) Primary Care Provider: Lin Landsman Other Clinician: Referring Provider: Treating Provider/Extender:Kindall Swaby, Fernanda Drum, [name] in Treatment: 11 Subjective History of Present Illness (HPI) The following HPI elements were documented for the patient's wound: Location: Patient presents with a wound to left lower leg. Quality: Patient reports No Pain. Severity: Patient states wound(s) are getting worse. Duration: Patient has had the wound for < 2 weeks prior to presenting for treatment Modifying Factors: pvd pt smoked until 5 years ago. cancer surgery 1 year ago. has ruq stoma. cv studies last year revealed non- compressible vessels. recent pe by pcp yielded some heart abnormality and he is scheduled for stress test. 5/24 2 new wounds on rt leg. left side healed 08/12/14; several small wounds on the right lateral leg and one wound on the left leg  which is new. The edema control here does not look adequate. There is also some degree of maceration around the wounds on the right lateral leg READMISSION 09/14/2018 This is a patient we had in this clinic in 2013 and then again in 2016 with wounds on his right lateral leg secondary to chronic venous insufficiency. He was cared for I believe in 2016 by Dr. Lindon Romp. The patient is here for review of 6 small wounds within a large surgical area on his abdomen. The patient had surgery for a malignant neoplasm of the sigmoid colon with a colostomy. I cannot see the date of the exact surgery in epic. In February 2018 he had an attempted colostomy reversal however he had a microperforation I believe at the anastomosis. He required a repeat laparotomy. He had an ileostomy raised at that time. I believe the surgical wound at that time was left to close via secondary intention. The patient is unaware whether he had a wound VAC on this. In any case this almost is completely closed except he has been left with 6 small eraser head sized areas on the abdomen that have not healed. I am not exactly sure how he was how he is been dressing this. He has been followed with frequent visits in general surgery dating back at least over a year. Past medical history includes chronic systolic heart failure, stage III chronic renal failure. Malignant neoplasm of the sigmoid colon, morbid obesity. 7/31; not a lot of change 6 small wounds within a large surgical area on his abdomen. None of these are hyper granulated as opposed to last week. None of them have exposed mesh or surrounding infection. He has been using [facility] 8/14- He comes after 2 weeks, he has only 2 open wounds that I can tell this time, he has been using Hydrofera Blue 8/28; 2-week follow-up. Very superficial areas on the abdominal surgical site scar. He is using Hydrofera Blue and this seems to be doing a good job. He has not made it down to the  medical supply store to see if he can get an abdominal binder or something to keep the tension off this wound area. READMISSION 01/04/2019 This is a 69 year old man we  had in the clinic from July through August 2020. He had wounds in the midline abdominal scar. [Please see history from my note of 09/14/2018}. When he was here last time we used Hydrofera Blue. Things seem to be improving. He did not discharge in a healed state his last visit here was on 8/28. He states he continued to use the [facility] and at 1 point this was very close to healing but more recently it is reopened. The wounds are basically in the same state as last time superficial friable easily bleeding wounds. Some hyper granulation. He has an abdominal binder that he got at [facility] however he says it is too tight for him to get on. Past medical history is reviewed; he has coronary artery disease, stage III chronic renal failure heart failure with reduced ejection fraction, COPD, hypertension, obstructive sleep apnea. He was hospitalized since he was here last time for a severe pharyngitis. 11/19; the patient was in the ER on 11/16; this was for oozing from his abdominal wound. He was cauterized with quick clot gauze and epi and silver nitrate. This was felt to be venous oozing. He has very friable wounds in this area. He is using his abdominal binder. 12/3; the patient was in the ER with bleeding from the lower part of his incision in the abdomen on 11/28. Had a stitch placed to contain the bleeding area. This was just underneath his major wound. He saw general surgery yesterday and apparently we are to remove the stitch in 2 weeks. The patient is on Plavix and aspirin for coronary artery disease with stents. His bleeding was felt to be capillary. 12/17; I removed the stitch from the bleeding area that was placed last time. This came out with some difficulty. The major area almost looks like slough with an area of  cellophane over it. I did not attempt any further debridement 02/28/2019. The patient has new areas superiorly and inferiorly to the major wound in the middle. The area in the middle has a nonviable surface over perhaps 60 to 70% however debridement causes uncontrolled bleeding here. He is on Plavix. We have been usin hydrogel and Prisma. 1/21; the patient has 2 superficial areas superiorly he has the major wound in the middle and a small area inferiorly in the middle of this abdominal wound. We switch to Iodoflex last time 2/4; the patient has 3 open areas. 2 of these are on the right part of this surgical wound on his abdomen and one more on the left central. The 2 on the right are hyper granulated I used silver nitrate on these. The area on the left appears to have a fibrinous surface. I attempted to debride this once he ended up in the ER. I am reluctant to do this again. We have been using Iodoflex Objective Constitutional Sitting or standing Blood Pressure is within target range for patient.. Pulse regular and within target range for patient.[name] Kitchen Respirations regular, non-labored and within target range.. Temperature is normal and within the target range for the patient.[name] Kitchen Appears in no distress. Vitals Time Taken: 2:08 PM, Height: 71 in, Weight: 344 lbs, BMI: 48, Temperature: 97.8 F, Pulse: 108 bpm, Respiratory Rate: 20 breaths/min, Blood Pressure: 115/66 mmHg. General Notes: Wound exam; the areas in the mid part of his abdomen. The abdomen is distended his ostomy bag appears to be draining well. Again he has 2 hyper granulated areas on the right-hand side of this and one more central. Both of the  hyper granulated areas on the right were cauterized with silver nitrate. ooThe area in the left part of the mid wound has debris over the surface of very odd surface. I attempted to debride this once before he ended up in the ER with bleeding Integumentary (Hair, Skin) Wound #10 status is Open.  Original cause of wound was Gradually Appeared. The wound is located on the Proximal Abdomen - midline. The wound measures 0.5cm length x 0.4cm width x 0.1cm depth; 0.157cm^2 area and 0.016cm^3 volume. There is Fat Layer (Subcutaneous Tissue) Exposed exposed. There is no tunneling or undermining noted. There is a small amount of serosanguineous drainage noted. The wound margin is distinct with the outline attached to the wound base. There is large (67-100%) pink, hyper - granulation within the wound bed. There is no necrotic tissue within the wound bed. Wound #8 status is Open. Original cause of wound was Surgical Injury. The wound is located on the Abdomen - midline. The wound measures 2.5cm length x 1.8cm width x 0.1cm depth; 3.534cm^2 area and 0.353cm^3 volume. There is Fat Layer (Subcutaneous Tissue) Exposed exposed. There is no tunneling or undermining noted. There is a medium amount of serosanguineous drainage noted. The wound margin is flat and intact. There is medium (34-66%) pink granulation within the wound bed. There is a medium (34-66%) amount of necrotic tissue within the wound bed. Wound #9 status is Open. Original cause of wound was Gradually Appeared. The wound is located on the Distal Abdomen - midline. The wound measures 0.7cm length x 0.5cm width x 0.1cm depth; 0.275cm^2 area and 0.027cm^3 volume. There is Fat Layer (Subcutaneous Tissue) Exposed exposed. There is no tunneling or undermining noted. There is a small amount of serosanguineous drainage noted. The wound margin is distinct with the outline attached to the wound base. There is large (67-100%) pink, hyper - granulation within the wound bed. There is no necrotic tissue within the wound bed. Assessment Active Problems ICD-10 Disruption of external operation (surgical) wound, not elsewhere classified, subsequent encounter Unspecified open wound of abdominal wall, periumbilic region without penetration into peritoneal  cavity, sequela Procedures Wound #10 Pre-procedure diagnosis of Wound #10 is an Open Surgical Wound located on the Proximal Abdomen - midline . An Chemical Cauterization procedure was performed by Ricard Dillon., MD. Post procedure Diagnosis Wound #10: Same as Pre-Procedure Notes: silver nitrate used. Wound #8 Pre-procedure diagnosis of Wound #8 is an Open Surgical Wound located on the Abdomen - midline . An Chemical Cauterization procedure was performed by Ricard Dillon., MD. Post procedure Diagnosis Wound #8: Same as Pre-Procedure Notes: silver nitrate used. Wound #9 Pre-procedure diagnosis of Wound #9 is an Open Surgical Wound located on the Distal Abdomen - midline . An Chemical Cauterization procedure was performed by Ricard Dillon., MD. Post procedure Diagnosis Wound #9: Same as Pre-Procedure Notes: silver nitrate used. Plan Follow-up Appointments: Return Appointment in 2 weeks. Dressing Change Frequency: Change Dressing every other day. Wound Cleansing: May shower and wash wound with soap and water. - with dressing change Primary Wound Dressing: Wound #10 Proximal Abdomen - midline: Iodoflex Wound #8 Abdomen - midline: Iodoflex Wound #9 Distal Abdomen - midline: Iodoflex Secondary Dressing: Dry Gauze ABD pad Other: - abdominal binder while up during the day. may remove at night. 1. I silver nitrated to the small areas that are hyper granulation 2. The more midline area other than continuing Iodoflex I do not have a good sense of how to deal with this. If I  debrided this there was bleeding that it was apparently uncontrolled. Electronic Signature(s) Signed: 03/28/2019 5:50:02 PM By: Linton Ham MD Entered By: Linton Ham on 03/28/2019 15:51:46 -------------------------------------------------------------------------------- SuperBill Details Patient Name: Date of Service: Larry [name], Larry [name] 03/28/2019 Medical Record [id number] Patient  Account Number: [id number] Date of Birth/Sex: Treating RN: 20-Apr-1950 (68 y.o. [name] Primary Care Provider: Lin Landsman Other Clinician: Referring Provider: Treating Provider/Extender:Latise Dilley, Fernanda Drum, [name] in Treatment: 11 Diagnosis Coding ICD-10 Codes Code Description Disruption of external operation (surgical) wound, not elsewhere classified, subsequent T81.31XD encounter Unspecified open wound of abdominal wall, periumbilic region without penetration into peritoneal S31.105S cavity, sequela Facility Procedures The patient participates with Medicare or their insurance follows the Medicare Facility Guidelines: CPT4 Description Modifier Quantity Code [id number] - CHEM CAUT GRANULATION TISS 1 ICD-10 Diagnosis Description S31.105S Unspecified open wound of  abdominal wall, periumbilic region without penetration into peritoneal cavity, sequela T81.31XD Disruption of external operation (surgical) wound, not elsewhere classified, subsequent encounter Physician Procedures CPT4: Code [id number] 1 Description: [id number] - WC PHYS CHEM CAUT GRAN TISSUE ICD-10 Diagnosis Description [id number] Unspecified open wound of abdominal wall, periumbilic regi into peritoneal cavity, sequela T81.31XD Disruption of external operation (surgical) wound, not els  subsequent encounter Modifier: on without pen ewhere classif Quantity: 1 etration ied, Electronic Signature(s) Signed: 03/28/2019 5:50:02 PM By: Linton Ham MD Entered By: Linton Ham on 03/28/2019 15:52:07

## 2019-04-02 NOTE — Progress Notes (Signed)
[name], [name] ([id number]) Visit Report for 03/28/2019 Arrival Information Details Patient Name: Date of Service: Larry, [name] 03/28/2019 1:45 PM Medical Record [id number] Patient Account Number: [id number] Date of Birth/Sex: Treating RN: [birth year] (69 y.o. Larry [name] Primary Care Larry [name]: Larry [name] Other Clinician: Referring Larry [name]: Treating Larry [name]/Extender:Larry [name], Larry [name] in Treatment: 11 Visit Information History Since Last Visit Added or deleted any medications: No Patient Arrived: Ambulatory Any new allergies or adverse reactions: No Arrival Time: 14:07 Had a fall or experienced change in No Accompanied By: alone activities of daily living that may affect Transfer Assistance: None risk of falls: Patient Identification Verified: Yes Signs or symptoms of abuse/neglect since last No Secondary Verification Process Yes visito Completed: Hospitalized since last visit: No Patient Requires Transmission-Based No Implantable device outside of the clinic excluding No Precautions: cellular tissue based products placed in the center Patient Has Alerts: No since last visit: Has Dressing in Place as Prescribed: Yes Pain Present Now: No Electronic Signature(s) Signed: 04/02/2019 5:30:42 PM By: Larry Hurst RN, BSN Entered By: Larry [name] on 03/28/2019 14:09:28 -------------------------------------------------------------------------------- Encounter Discharge Information Details Patient Name: Date of Service: Larry [name] 03/28/2019 1:45 PM Medical Record [id number] Patient Account Number: [id number] Date of Birth/Sex: Treating RN: [name] (69 y.o. Larry [name] Primary Care Larry [name]: Larry [name] Other Clinician: Referring Larry [name]: Treating Larry [name]/Extender:Larry [name], Larry [name] in Treatment: 11 Encounter Discharge Information Items Discharge Condition: Stable Ambulatory Status:  Ambulatory Discharge Destination: Home Transportation: Private Auto Accompanied By: self Schedule Follow-up Appointment: Yes Clinical Summary of Care: Patient Declined Electronic Signature(s) Signed: 03/28/2019 5:40:52 PM By: Baruch Gouty RN, BSN Entered By: Baruch Gouty on 03/28/2019 15:02:44 -------------------------------------------------------------------------------- [place] Details Patient Name: Date of Service: Larry [name] 03/28/2019 1:45 PM Medical Record [id number] Patient Account Number: [id number] Date of Birth/Sex: Treating RN: Larry [name] (68 y.o. Larry [name] Primary Care Natassja Ollis: Larry [name] Other Clinician: Referring Larry [name]: Treating Ka Flammer/Extender:Larry [name], Larry [name] in Treatment: 11 Active Inactive Nutrition Nursing Diagnoses: Potential for alteratiion in Nutrition/Potential for imbalanced nutrition Goals: Patient/caregiver agrees to and verbalizes understanding of need to obtain nutritional consultation Date Initiated: 03/28/2019 Target Resolution Date: 05/03/2019 Goal Status: Active Interventions: Provide education on nutrition Notes: Electronic Signature(s) Signed: 03/28/2019 5:56:18 PM By: Larry [name] Entered By: Larry [name] on 03/28/2019 15:53:50 -------------------------------------------------------------------------------- Pain Assessment Details Patient Name: Date of Service: Larry [name], Larry [name] 03/28/2019 1:45 PM Medical Record [id number] Patient Account Number: [id number] Date of Birth/Sex: Treating RN: [birth year] [name] (69 y.o. Larry [name] Primary Care Larry [name]: Larry [name] Other Clinician: Referring Larry [name]: Treating Larry [name]/Extender:Larry [name], Larry [name] in Treatment: 11 Active Problems Location of Pain Severity and Description of Pain Patient Has Paino No Site Locations Pain Management and Medication Current Pain Management: Electronic  Signature(s) Signed: 04/02/2019 5:30:42 PM By: Larry Hurst RN, BSN Entered By: Larry [name] on 03/28/2019 14:16:11 Patient/Caregiver Education Details Date of Service: -------------------------------------------------------------------------------- Larry [name] 2/4/2021andnbsp1:45 PM Patient Name: L. Patient Account Number: [id number] Medical Record Treating RN: Larry [name] [id number] Number: Other Clinician: Date of Birth/Gender: [birth year][name] (70 y.o. M) Treating Linton Ham Primary Care Physician: Larry [name] Physician/Extender: Referring Physician: Katharine Look in Treatment: 11 Education Assessment Education Provided To: Patient Education Topics Provided Nutrition: Handouts: Nutrition Methods: Explain/Verbal Responses: Reinforcements needed Electronic Signature(s) Signed: 03/28/2019 5:56:18 PM By: Larry [name] Entered By: Larry [name] on 03/28/2019 15:54:04 -------------------------------------------------------------------------------- Wound Assessment Details Patient Name: Date of Service: Larry [name], Larry [name] 03/28/2019 1:45 PM Medical Record [id number] Patient Account Number: [id number] Date of Birth/Sex:  Treating RN: [name] (69 y.o. M) Primary Care Yanixan Mellinger: Larry [name] Other Clinician: Referring Jazaria Jarecki: Treating Larry [name]/Extender:Larry [name], Larry [name] in Treatment: 11 Wound Status Wound Number: 10 Primary Open Surgical Wound Etiology: Wound Location: Proximal Abdomen - midline Wound Open Wounding Event: Gradually Appeared Status: Date Acquired: 02/28/2019 Comorbid Asthma, Chronic Obstructive Pulmonary [name] Of Treatment: 4 History: Disease (COPD), Sleep Apnea, Arrhythmia, Clustered Wound: Yes Congestive Heart Failure, Coronary Artery Disease, Hypertension, Peripheral Arterial Disease, Peripheral Venous Disease Photos Wound Measurements Length: (cm) 0.5 % Reduct Width: (cm) 0.4 % Reduct Depth: (cm) 0.1  Epitheli Clustered Quantity: 1 Tunnelin Area: (cm) 0.157 Undermi Volume: (cm) 0.016 Wound Description Classification: Full Thickness Without Exposed Support Foul Od Structures Slough/ Wound Distinct, outline attached Margin: Exudate Small Amount: Exudate Serosanguineous Type: Exudate Exudate red, brown Color: Wound Bed Granulation Amount: Large (67-100%) Granulation Quality: Pink, Hyper-granulation Fascia Ex Necrotic Amount: None Present (0%) Fat Layer Tendon Ex Muscle Ex Joint Exp Bone Expo or After Cleansing: No Fibrino No Exposed Structure posed: No (Subcutaneous Tissue) Exposed: Yes posed: No posed: No osed: No sed: No ion in Area: -67% ion in Volume: -77.8% alization: Medium (34-66%) g: No ning: No Treatment Notes Wound #10 (Proximal Abdomen - midline) 3. Primary Dressing Applied Iodoflex 4. Secondary Dressing ABD Pad Dry Gauze 5. Secured With Recruitment consultant) Signed: 04/01/2019 4:47:04 PM By: Mikeal Hawthorne EMT/HBOT Entered By: Mikeal Hawthorne on 04/01/2019 15:20:51 -------------------------------------------------------------------------------- Wound Assessment Details Patient Name: Date of Service: Larry [name], Larry [name] 03/28/2019 1:45 PM Medical Record [id number] Patient Account Number: [id number] Date of Birth/Sex: Treating RN: [birth year][name] (69 y.o. M) Primary Care Tomothy Eddins: Larry [name] Other Clinician: Referring Jashaun Penrose: Treating Welton Bord/Extender:Larry [name], Larry [name] in Treatment: 11 Wound Status Wound Number: 8 Primary Open Surgical Wound Etiology: Wound Location: Abdomen - midline Wound Open Wounding Event: Surgical Injury Status: Date Acquired: 02/21/2017 Comorbid Asthma, Chronic Obstructive Pulmonary [name] Of Treatment: 11 History: Disease (COPD), Sleep Apnea, Arrhythmia, Clustered Wound: Yes Congestive Heart Failure, Coronary Artery Disease, Hypertension, Peripheral Arterial Disease,  Peripheral Venous Disease Photos Wound Measurements Length: (cm) 2.5 % Reduct Width: (cm) 1.8 % Reduct Depth: (cm) 0.1 Epitheli Area: (cm) 3.534 Tunneli Volume: (cm) 0.353 Undermi Wound Description Classification: Full Thickness Without Exposed Support Foul Od Structures Slough/ Wound Flat and Intact Margin: Exudate Medium Amount: Exudate Serosanguineous Type: Exudate red, brown Color: Wound Bed Granulation Amount: Medium (34-66%) Granulation Quality: Pink Fascia E Necrotic Amount: Medium (34-66%) Fat Laye Tendon E Muscle E Joint Ex Bone Exp or After Cleansing: No Fibrino No Exposed Structure xposed: No r (Subcutaneous Tissue) Exposed: Yes xposed: No xposed: No posed: No osed: No ion in Area: 95.6% ion in Volume: 95.6% alization: Small (1-33%) ng: No ning: No Treatment Notes Wound #8 (Abdomen - midline) 3. Primary Dressing Applied Iodoflex 4. Secondary Dressing ABD Pad Dry Gauze 5. Secured With Recruitment consultant) Signed: 04/01/2019 4:47:04 PM By: Mikeal Hawthorne EMT/HBOT Entered By: Mikeal Hawthorne on 04/01/2019 15:21:11 -------------------------------------------------------------------------------- Wound Assessment Details Patient Name: Date of Service: Larry [name], Larry [name] 03/28/2019 1:45 PM Medical Record [id number] Patient Account Number: [id number] Date of Birth/Sex: Treating RN: [birth year][name] (69 y.o. M) Primary Care Milfred Krammes: Larry [name] Other Clinician: Referring Harnoor Kohles: Treating Ranen Doolin/Extender:Larry [name], Larry [name] in Treatment: 11 Wound Status Wound Number: 9 Primary Open Surgical Wound Etiology: Wound Location: Distal Abdomen - midline Wound Open Wounding Event: Gradually Appeared Status: Date Acquired: 02/28/2019 Comorbid Asthma, Chronic Obstructive Pulmonary [name] Of Treatment: 4 History: Disease (COPD), Sleep Apnea, Arrhythmia, Clustered Wound: No Congestive Heart Failure,  Coronary  Artery Disease, Hypertension, Peripheral Arterial Disease, Peripheral Venous Disease Photos Wound Measurements Length: (cm) 0.7 % Reduct Width: (cm) 0.5 % Reduct Depth: (cm) 0.1 Epitheli Area: (cm) 0.275 Tunneli Volume: (cm) 0.027 Undermi Wound Description Classification: Full Thickness Without Exposed Support Foul Od Structures Slough/ Wound Distinct, outline attached Margin: Exudate Small Amount: Exudate Serosanguineous Type: Exudate red, brown Color: Wound Bed Granulation Amount: Large (67-100%) Granulation Quality: Pink, Hyper-granulation Fascia E Necrotic Amount: None Present (0%) Fat Laye Tendon E Muscle E Joint Ex Bone Exp Treatment Notes Wound #9 (Distal Abdomen - midline) 3. Primary Dressing Applied Iodoflex 4. Secondary Dressing ABD Pad Dry Gauze 5. Secured With Tape or After Cleansing: No Fibrino No Exposed Structure xposed: No r (Subcutaneous Tissue) Exposed: Yes xposed: No xposed: No posed: No osed: No ion in Area: 70.8% ion in Volume: 71.3% alization: Medium (34-66%) ng: No ning: No Electronic Signature(s) Signed: 04/01/2019 4:47:04 PM By: Mikeal Hawthorne EMT/HBOT Entered By: Mikeal Hawthorne on 04/01/2019 15:21:44 -------------------------------------------------------------------------------- Vitals Details Patient Name: Date of Service: Larry [name] 03/28/2019 1:45 PM Medical Record [id number] Patient Account Number: [id number] Date of Birth/Sex: Treating RN: [birth year] (68 y.o. Larry [name] Primary Care Bland Rudzinski: Larry [name] Other Clinician: Referring Brenee Gajda: Treating Blaike Vickers/Extender:Larry [name], Larry [name] in Treatment: 11 Vital Signs Time Taken: 14:08 Temperature (F): 97.8 Height (in): 71 Pulse (bpm): 108 Weight (lbs): 344 Respiratory Rate (breaths/min): 20 Body Mass Index (BMI): 48 Blood Pressure (mmHg): 115/66 Reference Range: 80 - 120 mg / dl Electronic Signature(s) Signed:  04/02/2019 5:30:42 PM By: Larry Hurst RN, BSN Entered By: Larry [name] on 03/28/2019 14:16:06

## 2019-04-11 ENCOUNTER — Encounter (HOSPITAL_BASED_OUTPATIENT_CLINIC_OR_DEPARTMENT_OTHER): Payer: Medicare Other | Admitting: Internal Medicine

## 2019-04-18 ENCOUNTER — Other Ambulatory Visit: Payer: Self-pay

## 2019-04-18 ENCOUNTER — Encounter (HOSPITAL_BASED_OUTPATIENT_CLINIC_OR_DEPARTMENT_OTHER): Payer: Medicare Other | Admitting: Internal Medicine

## 2019-04-18 DIAGNOSIS — C187 Malignant neoplasm of sigmoid colon: Secondary | ICD-10-CM | POA: Diagnosis not present

## 2019-04-18 DIAGNOSIS — T8189XA Other complications of procedures, not elsewhere classified, initial encounter: Secondary | ICD-10-CM | POA: Diagnosis not present

## 2019-04-18 DIAGNOSIS — I130 Hypertensive heart and chronic kidney disease with heart failure and stage 1 through stage 4 chronic kidney disease, or unspecified chronic kidney disease: Secondary | ICD-10-CM | POA: Diagnosis not present

## 2019-04-18 DIAGNOSIS — N183 Chronic kidney disease, stage 3 unspecified: Secondary | ICD-10-CM | POA: Diagnosis not present

## 2019-04-18 DIAGNOSIS — S81802A Unspecified open wound, left lower leg, initial encounter: Secondary | ICD-10-CM | POA: Diagnosis not present

## 2019-04-18 DIAGNOSIS — I5022 Chronic systolic (congestive) heart failure: Secondary | ICD-10-CM | POA: Diagnosis not present

## 2019-04-18 NOTE — Progress Notes (Signed)
Larry [name], Larry [name]. ([id number]) Visit Report for 04/18/2019 HPI Details Patient Name: Date of Service: Larry [name], Larry [name] 04/18/2019 2:00 PM Medical Record [id number] Patient Account Number: [id number] Date of Birth/Sex: Treating RN: [birth year] (69 y.o. Larry [name] Primary Care Provider: Lin [name] Other Clinician: Referring Provider: Treating Provider/Extender:Larry [name], Larry [name], Larry [name]: 14 History of Present Illness Location: Patient presents with a wound to left lower leg. Quality: Patient reports No Pain. Severity: Patient states wound(s) are getting worse. Duration: Patient has had the wound for < 2 weeks prior to presenting for [name] Modifying Factors: pvd HPI Description: pt smoked until 5 years ago. cancer surgery 1 year ago. has ruq stoma. cv studies last year revealed non-compressible vessels. recent pe by pcp yielded some heart abnormality and he is scheduled for stress test. 5/24 2 new wounds on rt leg. left side healed 08/12/14; several small wounds on the right lateral leg and one wound on the left leg which is new. The edema control here does not look adequate. There is also some degree of maceration around the wounds on the right lateral leg READMISSION 09/14/2018 This is a patient we had in this clinic in 2013 and then again in 2016 with wounds on his right lateral leg secondary to chronic venous insufficiency. He was cared for I believe in 2016 by Larry [name]. The patient is here for review of 6 small wounds within a large surgical area on his abdomen. The patient had surgery for a malignant neoplasm of the sigmoid colon with a colostomy. I cannot see the date of the exact surgery in epic. In February 2018 he had an attempted colostomy reversal however he had a microperforation I believe at the anastomosis. He required a repeat laparotomy. He had an ileostomy raised at that time. I believe the surgical wound at that time was left  to close via secondary intention. The patient is unaware whether he had a wound VAC on this. In any case this almost is completely closed except he has been left with 6 small eraser head sized areas on the abdomen that have not healed. I am not exactly sure how he was how he is been dressing this. He has been followed with frequent visits in general surgery dating back at least over a year. Past medical history includes chronic systolic heart failure, stage III chronic renal failure. Malignant neoplasm of the sigmoid colon, morbid obesity. 7/31; not a lot of change 6 small wounds within a large surgical area on his abdomen. None of these are hyper granulated as opposed to last week. None of them have exposed mesh or surrounding infection. He has been using [facility] 8/14- He comes after 2 weeks, he has only 2 open wounds that I can tell this time, he has been using Hydrofera Blue 8/28; 2-week follow-up. Very superficial areas on the abdominal surgical site scar. He is using Hydrofera Blue and this seems to be doing a good job. He has not made it down to the medical supply store to see if he can get an abdominal binder or something to keep the tension off this wound area. READMISSION 01/04/2019 This is a 69 year old man we had in the clinic from July through August 2020. He had wounds in the midline abdominal scar. [Please see history from my note of 09/14/2018}. When he was here last time we used Hydrofera Blue. Things seem to be improving. He did not discharge in a healed state his last visit here was on  8/28. He states he continued to use the [facility] and at 1 point this was very close to healing but more recently it is reopened. The wounds are basically in the same state as last time superficial friable easily bleeding wounds. Some hyper granulation. He has an abdominal binder that he got at [facility] however he says it is too tight for him to get on. Past medical history is  reviewed; he has coronary artery disease, stage III chronic renal failure heart failure with reduced ejection fraction, COPD, hypertension, obstructive sleep apnea. He was hospitalized since he was here last time for a severe pharyngitis. 11/19; the patient was in the ER on 11/16; this was for oozing from his abdominal wound. He was cauterized with quick clot gauze and epi and silver nitrate. This was felt to be venous oozing. He has very friable wounds in this area. He is using his abdominal binder. 12/3; the patient was in the ER with bleeding from the lower part of his incision in the abdomen on 11/28. Had a stitch placed to contain the bleeding area. This was just underneath his major wound. He saw general surgery yesterday and apparently we are to remove the stitch in 2 weeks. The patient is on Plavix and aspirin for coronary artery disease with stents. His bleeding was felt to be capillary. 12/17; I removed the stitch from the bleeding area that was placed last time. This came out with some difficulty. The major area almost looks like slough with an area of cellophane over it. I did not attempt any further debridement 02/28/2019. The patient has new areas superiorly and inferiorly to the major wound in the middle. The area in the middle has a nonviable surface over perhaps 60 to 70% however debridement causes uncontrolled bleeding here. He is on Plavix. We have been usin hydrogel and Prisma. 1/21; the patient has 2 superficial areas superiorly he has the major wound in the middle and a small area inferiorly in the middle of this abdominal wound. We switch to Iodoflex last time 2/4; the patient has 3 open areas. 2 of these are on the right part of this surgical wound on his abdomen and one more on the left central. The 2 on the right are hyper granulated I used silver nitrate on these. The area on the left appears to have a fibrinous surface. I attempted to debride this once he ended up in the  ER. I am reluctant to do this again. We have been using Iodoflex 2/25; he has 3 open areas small ones proximally and distally and then the larger one in the middle. This has nonviable debris over fibrinous debris over the surface. He is using [employer]) Signed: 04/18/2019 5:41:09 PM By: Linton Ham MD Entered By: Linton Ham on 04/18/2019 14:58:43 -------------------------------------------------------------------------------- Physical Exam Details Patient Name: Date of Service: [name] 04/18/2019 2:00 PM Medical Record [id number] Patient Account Number: [id number] Date of Birth/Sex: Treating RN: 1950/03/15 (68 y.o. Larry [name] Primary Care Provider: Lin [name] Other Clinician: Referring Provider: Treating Provider/Extender:Zyann Mabry, Larry [name], Larry [name]: 14 Constitutional Sitting or standing Blood Pressure is within target range for patient.. Pulse regular and within target range for patient.[name] Kitchen Respirations regular, non-labored and within target range.. Temperature is normal and within the target range for the patient.[name] Kitchen Appears in no distress. Notes Wound exam; the areas in the mid part of his abdominal scar. Still distended although his ostomy bag is working well. He is nontender.  The area superiorly is superficial and looks like it is contracting. The area distally is almost healed. His largest area is in the midline. His abdomen is nontender slightly distended Electronic Signature(s) Signed: 04/18/2019 5:41:09 PM By: Linton Ham MD Entered By: Linton Ham on 04/18/2019 15:00:25 -------------------------------------------------------------------------------- Physician Orders Details Patient Name: Date of Service: [name] 04/18/2019 2:00 PM Medical Record [id number] Patient Account Number: [id number] Date of Birth/Sex: Treating RN: 1950-11-07 (68 y.o. Larry [name] Primary Care  Provider: Lin [name] Other Clinician: Referring Provider: Treating Provider/Extender:Rishi Vicario, Larry [name], Larry [name]: 69 Verbal / Phone Orders: No Diagnosis Coding ICD-10 Coding Code Description Disruption of external operation (surgical) wound, not elsewhere classified, subsequent T81.31XD encounter Unspecified open wound of abdominal wall, periumbilic region without penetration into peritoneal S31.105S cavity, sequela Follow-up Appointments Return appointment in 1 month. - earlier in the day. Dressing Change Frequency Change Dressing every other day. Wound Cleansing May shower and wash wound with soap and water. - with dressing change Primary Wound Dressing Wound #10 Proximal Abdomen - midline Iodoflex Wound #8 Abdomen - midline Iodoflex Wound #9 Distal Abdomen - midline Iodoflex Secondary Dressing Dry Gauze ABD pad Other: - abdominal binder while up during the day. may remove at night. Electronic Signature(s) Signed: 04/18/2019 5:41:09 PM By: Linton Ham MD Signed: 04/18/2019 6:07:18 PM By: Deon Pilling Entered By: Deon Pilling on 04/18/2019 14:40:43 -------------------------------------------------------------------------------- Problem List Details Patient Name: Date of Service: [name] 04/18/2019 2:00 PM Medical Record [id number] Patient Account Number: [id number] Date of Birth/Sex: Treating RN: [birth year] (68 y.o. Larry [name] Primary Care Provider: Lin [name] Other Clinician: Referring Provider: Treating Provider/Extender:Dagoberto Nealy, Larry [name], Larry [name]: 14 Active Problems ICD-10 Evaluated Encounter Code Description Active Date Today Diagnosis T81.31XD Disruption of external operation (surgical) wound, not 01/04/2019 No Yes elsewhere classified, subsequent encounter S31.105S Unspecified open wound of abdominal wall, [id number] No Yes periumbilic region without penetration into  peritoneal cavity, sequela Inactive Problems Resolved Problems Electronic Signature(s) Signed: 04/18/2019 5:41:09 PM By: Linton Ham MD Entered By: Linton Ham on 04/18/2019 14:57:56 -------------------------------------------------------------------------------- Progress Note Details Patient Name: Date of Service: [name] 04/18/2019 2:00 PM Medical Record [id number] Patient Account Number: [id number] Date of Birth/Sex: Treating RN: 01/11/1951 (69 y.o. Larry [name] Primary Care Provider: Lin [name] Other Clinician: Referring Provider: Treating Provider/Extender:Donevan Biller, Larry [name], Larry [name]: 14 Subjective History of Present Illness (HPI) The following HPI elements were documented for the patient's wound: Location: Patient presents with a wound to left lower leg. Quality: Patient reports No Pain. Severity: Patient states wound(s) are getting worse. Duration: Patient has had the wound for < 2 weeks prior to presenting for [name] Modifying Factors: pvd pt smoked until 5 years ago. cancer surgery 1 year ago. has ruq stoma. cv studies last year revealed non- compressible vessels. recent pe by pcp yielded some heart abnormality and he is scheduled for stress test. 5/24 2 new wounds on rt leg. left side healed 08/12/14; several small wounds on the right lateral leg and one wound on the left leg which is new. The edema control here does not look adequate. There is also some degree of maceration around the wounds on the right lateral leg READMISSION 09/14/2018 This is a patient we had in this clinic in 2013 and then again in 2016 with wounds on his right lateral leg secondary to chronic venous insufficiency. He was cared for I believe in 2016 by Larry [name]. The patient is here for review of 6  small wounds within a large surgical area on his abdomen. The patient had surgery for a malignant neoplasm of the sigmoid colon with a colostomy. I  cannot see the date of the exact surgery in epic. In February 2018 he had an attempted colostomy reversal however he had a microperforation I believe at the anastomosis. He required a repeat laparotomy. He had an ileostomy raised at that time. I believe the surgical wound at that time was left to close via secondary intention. The patient is unaware whether he had a wound VAC on this. In any case this almost is completely closed except he has been left with 6 small eraser head sized areas on the abdomen that have not healed. I am not exactly sure how he was how he is been dressing this. He has been followed with frequent visits in general surgery dating back at least over a year. Past medical history includes chronic systolic heart failure, stage III chronic renal failure. Malignant neoplasm of the sigmoid colon, morbid obesity. 7/31; not a lot of change 6 small wounds within a large surgical area on his abdomen. None of these are hyper granulated as opposed to last week. None of them have exposed mesh or surrounding infection. He has been using [name] 8/14- He comes after 2 weeks, he has only 2 open wounds that I can tell this time, he has been using Hydrofera Blue 8/28; 2-week follow-up. Very superficial areas on the abdominal surgical site scar. He is using Hydrofera Blue and this seems to be doing a good job. He has not made it down to the medical supply store to see if he can get an abdominal binder or something to keep the tension off this wound area. READMISSION 01/04/2019 This is a 69 year old man we had in the clinic from July through August 2020. He had wounds in the midline abdominal scar. [Please see history from my note of 09/14/2018}. When he was here last time we used Hydrofera Blue. Things seem to be improving. He did not discharge in a healed state his last visit here was on 8/28. He states he continued to use the [facility] and at 1 point this was very close to  healing but more recently it is reopened. The wounds are basically in the same state as last time superficial friable easily bleeding wounds. Some hyper granulation. He has an abdominal binder that he got at [facility] however he says it is too tight for him to get on. Past medical history is reviewed; he has coronary artery disease, stage III chronic renal failure heart failure with reduced ejection fraction, COPD, hypertension, obstructive sleep apnea. He was hospitalized since he was here last time for a severe pharyngitis. 11/19; the patient was in the ER on 11/16; this was for oozing from his abdominal wound. He was cauterized with quick clot gauze and epi and silver nitrate. This was felt to be venous oozing. He has very friable wounds in this area. He is using his abdominal binder. 12/3; the patient was in the ER with bleeding from the lower part of his incision in the abdomen on 11/28. Had a stitch placed to contain the bleeding area. This was just underneath his major wound. He saw general surgery yesterday and apparently we are to remove the stitch in 2 weeks. The patient is on Plavix and aspirin for coronary artery disease with stents. His bleeding was felt to be capillary. 12/17; I removed the stitch from the bleeding area  that was placed last time. This came out with some difficulty. The major area almost looks like slough with an area of cellophane over it. I did not attempt any further debridement 02/28/2019. The patient has new areas superiorly and inferiorly to the major wound in the middle. The area in the middle has a nonviable surface over perhaps 60 to 70% however debridement causes uncontrolled bleeding here. He is on Plavix. We have been usin hydrogel and Prisma. 1/21; the patient has 2 superficial areas superiorly he has the major wound in the middle and a small area inferiorly in the middle of this abdominal wound. We switch to Iodoflex last time 2/4; the patient has 3  open areas. 2 of these are on the right part of this surgical wound on his abdomen and one more on the left central. The 2 on the right are hyper granulated I used silver nitrate on these. The area on the left appears to have a fibrinous surface. I attempted to debride this once he ended up in the ER. I am reluctant to do this again. We have been using Iodoflex 2/25; he has 3 open areas small ones proximally and distally and then the larger one in the middle. This has nonviable debris over fibrinous debris over the surface. He is using Iodoflex Objective Constitutional Sitting or standing Blood Pressure is within target range for patient.. Pulse regular and within target range for patient.[name] Kitchen Respirations regular, non-labored and within target range.. Temperature is normal and within the target range for the patient.[name] Kitchen Appears in no distress. Vitals Time Taken: 2:03 PM, Height: 71 in, Weight: 344 lbs, BMI: 48, Temperature: 98.2 F, Pulse: 101 bpm, Respiratory Rate: 20 breaths/min, Blood Pressure: 101/58 mmHg. General Notes: Wound exam; the areas in the mid part of his abdominal scar. Still distended although his ostomy bag is working well. He is nontender. The area superiorly is superficial and looks like it is contracting. The area distally is almost healed. His largest area is in the midline. His abdomen is nontender slightly distended Integumentary (Hair, Skin) Wound #10 status is Open. Original cause of wound was Gradually Appeared. The wound is located on the Proximal Abdomen - midline. The wound measures 0.3cm length x 0.3cm width x 0.1cm depth; 0.071cm^2 area and 0.007cm^3 volume. There is Fat Layer (Subcutaneous Tissue) Exposed exposed. There is no tunneling or undermining noted. There is a small amount of serosanguineous drainage noted. The wound margin is distinct with the outline attached to the wound base. There is large (67-100%) pink, hyper - granulation within the wound bed. There  is no necrotic tissue within the wound bed. Wound #8 status is Open. Original cause of wound was Surgical Injury. The wound is located on the Abdomen - midline. The wound measures 2.4cm length x 2cm width x 0.1cm depth; 3.77cm^2 area and 0.377cm^3 volume. There is Fat Layer (Subcutaneous Tissue) Exposed exposed. There is no tunneling or undermining noted. There is a medium amount of serosanguineous drainage noted. The wound margin is flat and intact. There is medium (34-66%) pink granulation within the wound bed. There is a medium (34-66%) amount of necrotic tissue within the wound bed. Wound #9 status is Open. Original cause of wound was Gradually Appeared. The wound is located on the Distal Abdomen - midline. The wound measures 0.1cm length x 0.1cm width x 0.1cm depth; 0.008cm^2 area and 0.001cm^3 volume. There is Fat Layer (Subcutaneous Tissue) Exposed exposed. There is no tunneling or undermining noted. There is a small amount  of serosanguineous drainage noted. The wound margin is distinct with the outline attached to the wound base. There is large (67-100%) pink, hyper - granulation within the wound bed. There is no necrotic tissue within the wound bed. Assessment Active Problems ICD-10 Disruption of external operation (surgical) wound, not elsewhere classified, subsequent encounter Unspecified open wound of abdominal wall, periumbilic region without penetration into peritoneal cavity, sequela Plan Follow-up Appointments: Return appointment in 1 month. - earlier in the day. Dressing Change Frequency: Change Dressing every other day. Wound Cleansing: May shower and wash wound with soap and water. - with dressing change Primary Wound Dressing: Wound #10 Proximal Abdomen - midline: Iodoflex Wound #8 Abdomen - midline: Iodoflex Wound #9 Distal Abdomen - midline: Iodoflex Secondary Dressing: Dry Gauze ABD pad Other: - abdominal binder while up during the day. may remove at  night. 1. The area distally in the abdominal wound is almost closed. 2. Areas approximately is smaller and superficial 3. The midline area is the problem. I attempted mechanical debridement of this once he ended up the in the ER with bleeding he is on Plavix. If we attempt this again we will have to keep him in the clinic for a more prolonged period to make sure that this does not happen again. 4. For now I am going to continue Iodoflex, plan for attempted mechanical debridement of the middle area next time Electronic Signature(s) Signed: 04/18/2019 5:41:09 PM By: Linton Ham MD Entered By: Linton Ham on 04/18/2019 15:01:59 -------------------------------------------------------------------------------- SuperBill Details Patient Name: Date of Service: [name] 04/18/2019 Medical Record [id number] Patient Account Number: [id number] Date of Birth/Sex: Treating RN: Jun 22, 1950 (68 y.o. Larry [name] Primary Care Provider: Lin [name] Other Clinician: Referring Provider: Treating Provider/Extender:Valynn Schamberger, Larry [name], Larry [name]: 14 Diagnosis Coding ICD-10 Codes Code Description Disruption of external operation (surgical) wound, not elsewhere classified, subsequent T81.31XD encounter Unspecified open wound of abdominal wall, periumbilic region without penetration into peritoneal S31.105S cavity, sequela Facility Procedures The patient participates with Medicare or their insurance follows the Medicare Facility Guidelines: CPT4 Code Description Modifier Quantity [id number] [place] VISIT-LEV 4 EST PT 1 Physician Procedures CPT4: Description Modifier Quantity Code [id number] - WC PHYS LEVEL 3 - EST PT 1 ICD-10 Diagnosis Description T81.31XD Disruption of external operation (surgical) wound, not elsewhere classified, subsequent encounter S31.105S Unspecified open wound of  abdominal wall, periumbilic region without penetration into  peritoneal cavity, sequela Electronic Signature(s) Signed: 04/18/2019 5:41:09 PM By: Linton Ham MD Entered By: Linton Ham on 04/18/2019 15:02:18

## 2019-04-30 NOTE — Progress Notes (Signed)
CARDIOLOGY OFFICE NOTE  Date:  05/08/2019    Larry [name] Date of Birth: [birth year] Medical Record #[id number]  PCP:  Lin Landsman, MD  Cardiologist:  [place]  Chief Complaint  Patient presents with  . Follow-up    Seen for Dr. Rayann Heman    History of Present Illness: Larry [name] is a 69 y.o. male who presents today for a follow up visit.  Seen for Dr. Rayann Heman.Primarily follows with me.   He has an ischemic CM with severe diffuse 3VD and prior stents to the RCA in 2010, chronic combined systolic and diastolic dysfunction, HTN, HLD, OSA on CPAP, morbid obesity and chronic massive lower extremity edema. Has had a history of long standing atrial tach that has been treated with beta blockerand CCB therapy. Found to have anemia due to colon cancer back in 2014 - he had surgery that was complicated by cardiac arrest with prolonged QT and subsequent torsades.   In 2016 he wanted to attempt having his colostomy reversed - Myoview was updated and was high risk - ended up being cathed by Dr. Burt Knack and had PCI to the LAD with DES x 2 and placed on DAPT.   He has had intermittent issues with elevation in his HR - he is typically not aware of this. He remains on both beta blocker and calcium channel blocker therapy. He had attempt at reversal of his colostomy in early 2018 - this was not successful and he had an anastomotic leak - respiratory failure and a prolonged stay at [facility].    I have seen him several times since. He has had prior admissions - medicines get cut back periodically and we inevitably put him back on due to the atrial tachycardia -  I have referred him back to Dr. Rayann Heman to review his medical therapy and to make sure we were all on track with the current plan of care. Has chronic and massive lower extremity edema - options are very limited and CCB will have to be continued.  Last seen in November - he was doing ok - was going to the wound clinic due  to a wound with his colostomy. Weight continued to climb. Not really symptomatic from our standpoint but it is a tenuous situation.   The patient does not have symptoms concerning for COVID-19 infection (fever, chills, cough, or new shortness of breath).   Comes in today. Here alone. He has had both COVID vaccines. Did well with that. Not doing much at all in way of activity. Not getting out due to the pandemic. Weight is about the same. Swelling he says is the same. Not dizzy. No palpitations reported. No chest pain. Says his breathing is stable. He feels like he is doing ok. He is still going to the wound center - this has been helpful for him. He is tolerating his medicines.   Past Medical History:  Diagnosis Date  . Arthritis   . Atrial tachycardia ([place])    managed on beta blocker therapy  . Childhood asthma    "went away after I was 16"  . Chronic combined systolic and diastolic CHF, NYHA class 3 (HCC)    has diastolic heart failure grade 1; EF is 45 to 50% per echo 05/2011; EF 41% by Myoview 2016  . CKD (chronic kidney disease) stage 3, GFR 30-59 ml/min   . Colon cancer ([place]) 2015   MSI high; IHC loss of MLH1 and PMS2; BRAF negative; Negative  methylation  . COPD (chronic obstructive pulmonary disease) ([place])   . Coronary artery disease   . Family history of ovarian cancer   . Hypercholesteremia   . Hypertension   . Noncompliance   . NSVT (nonsustained ventricular tachycardia) (HCC)    beta blocker restarted  . Obesity   . OSA on CPAP    used nightly, pt does not know settings  . Peripheral arterial disease (HCC)    4.2 cm thoracic aortic aneurysm per chest ct 11-14-15 epic  . S/P colostomy ([place])    2014  . Thrombocytopenia ([place])   . Torsades de pointes ([place])    X 2 episodes during hospital visit 12'14"electrolyte imbalance"- "Shocked"    Past Surgical History:  Procedure Laterality Date  . COLON SURGERY    . COLONOSCOPY N/A 02/08/2013   Procedure: COLONOSCOPY;  Surgeon:  [name], MD;  Location: [facility];  Service: Endoscopy;  Laterality: N/A;  . COLONOSCOPY WITH PROPOFOL N/A 05/09/2014   Procedure: COLONOSCOPY WITH PROPOFOL;  Surgeon: [name], MD;  Location: [facility];  Service: Endoscopy;  Laterality: N/A;  . COLONOSCOPY WITH PROPOFOL N/A 01/08/2016   Procedure: COLONOSCOPY WITH PROPOFOL;  Surgeon: [name], MD;  [facility];  Service: Endoscopy;  Laterality: N/A;  . COLONOSCOPY WITH PROPOFOL N/A 12/28/2018   Procedure: COLONOSCOPY WITH PROPOFOL;  Surgeon: [name], MD;  [facility];  Service: Endoscopy;  Laterality: N/A;  . COLOSTOMY N/A 04/19/2016   Procedure: COLOSTOMY;  Surgeon: Judeth Horn, MD;  Location: [facility];  Service: General;  Laterality: N/A;  . COLOSTOMY REVERSAL  04/12/2016  . COLOSTOMY REVERSAL N/A 04/12/2016   Procedure: COLOSTOMY REVERSAL;  Surgeon: Judeth Horn, MD;  Location: [facility];  Service: General;  Laterality: N/A;  . CORONARY ANGIOPLASTY WITH STENT PLACEMENT  11/12/2008; 06/11/2014   stent x 2 to RCA; stent x 2 to LAD  . CORONARY ANGIOPLASTY WITH STENT PLACEMENT  06/11/2014   m-LAD 3.5 x 16 mm Synergy DES, d-LAD  2.25 x 16 mm Synergy DES  . CYSTOSCOPY W/ URETERAL STENT PLACEMENT Left 06/30/2017   Procedure: CYSTOSCOPY WITH RETROGRADE PYELOGRAM/URETERAL STENT PLACEMENT;  Surgeon: Lucas Mallow, MD;  Location: [facility];  Service: Urology;  Laterality: Left;  . CYSTOSCOPY WITH LITHOLAPAXY N/A 08/02/2017   Procedure: CYSTOSCOPY WITH BLADDER STONE EXTRACTION;  Surgeon: Lucas Mallow, MD;  [facility];  Service: Urology;  Laterality: N/A;  . CYSTOSCOPY WITH RETROGRADE PYELOGRAM, URETEROSCOPY AND STENT PLACEMENT Left 08/02/2017   Procedure: CYSTOSCOPY WITH LEFT RETROGRADE PYELOGRAM, URETEROSCOPY AND STENT EXCHANGE;  Surgeon: Lucas Mallow, MD;  [facility];  Service: Urology;  Laterality: Left;  . ESOPHAGOGASTRODUODENOSCOPY N/A 02/08/2013   Procedure: ESOPHAGOGASTRODUODENOSCOPY  (EGD);  Surgeon: [name], MD;  [facility];  Service: Endoscopy;  Laterality: N/A;  . FLEXIBLE SIGMOIDOSCOPY N/A 02/19/2016   Procedure: FLEXIBLE SIGMOIDOSCOPY;  Surgeon: [name], MD;  [facility];  Service: Endoscopy;  Laterality: N/A;  . HOLMIUM LASER APPLICATION Left 2/97/9892   Procedure: HOLMIUM LASER APPLICATION;  Surgeon: Lucas Mallow, MD;  [facility];  Service: Urology;  Laterality: Left;  . LAPAROTOMY N/A 02/12/2013   Procedure: EXPLORATORY LAPAROTOMY PARTIAL COLECTOMY WITH COLOSTOMY;  Surgeon: [name], MD;  Location: [facility];  Service: General;  Laterality: N/A;  . LAPAROTOMY N/A 02/18/2013   Procedure: EXPLORATORY LAPAROTOMY/Closure of Wound;  Surgeon: [name], MD;  Location: [facility];  Service: General;  Laterality: N/A;  . LAPAROTOMY  N/A 04/19/2016   Procedure: EXPLORATORY LAPAROTOMY, REPAIR OF ANASTAMOTIC LEAK;  Surgeon: Judeth Horn, MD;  Location: [facility];  Service: General;  Laterality: N/A;  . LEFT HEART CATHETERIZATION WITH CORONARY ANGIOGRAM N/A 06/11/2014   Procedure: LEFT HEART CATHETERIZATION WITH CORONARY ANGIOGRAM;  Surgeon: [name], MD; CFX calcified, 30-40 percent, RCA calcified, 40/50/40%, PDA diffuse disease, LAD 40/75/90% s/p DES 2   . POLYPECTOMY  12/28/2018   Procedure: POLYPECTOMY;  Surgeon: [name], MD;  [facility];  Service: Endoscopy;;     Medications: Current Meds  Medication Sig  . aspirin EC 81 MG tablet Take 1 tablet (81 mg total) by mouth daily.  [name] Kitchen atorvastatin (LIPITOR) 40 MG tablet TAKE 1 TABLET BY MOUTH EVERY DAY (Patient taking differently: Take 40 mg by mouth every evening. )  . clopidogrel (PLAVIX) 75 MG tablet TAKE 1 TABLET (75 MG TOTAL) BY MOUTH DAILY WITH BREAKFAST. (Patient taking differently: Take 75 mg by mouth daily. )  . diltiazem (CARDIZEM) 60 MG tablet TAKE 1.5 TABLETS (90 MG TOTAL) BY MOUTH 3 (THREE) TIMES DAILY.  . ferrous sulfate 325 (65 FE) MG tablet Take 1  tablet (325 mg total) by mouth daily with breakfast.  . furosemide (LASIX) 40 MG tablet TAKE 1.5 TABLETS (60 MG TOTAL) BY MOUTH DAILY.  [name] Kitchen levalbuterol (XOPENEX HFA) 45 MCG/ACT inhaler Inhale 2 puffs into the lungs every 4 (four) hours as needed for wheezing.  . metoprolol tartrate (LOPRESSOR) 25 MG tablet TAKE 1.5 TABLETS (37.5 MG TOTAL) BY MOUTH 2 (TWO) TIMES DAILY.  . Multiple Vitamins-Minerals (CENTRUM SILVER 50+MEN PO) Take 1 tablet by mouth daily.  . nitroGLYCERIN (NITROSTAT) 0.4 MG SL tablet Place 0.4 mg under the tongue every 5 (five) minutes as needed for chest pain.  . simethicone (GAS-X EXTRA STRENGTH) 125 MG chewable tablet Chew 125 mg by mouth 3 (three) times daily.  [name] Kitchen spironolactone (ALDACTONE) 25 MG tablet Take 12.5 mg by mouth daily.  . tamsulosin (FLOMAX) 0.4 MG CAPS capsule TAKE 1 CAPSULE BY MOUTH EVERY DAY (Patient taking differently: Take 0.4 mg by mouth daily. )     Allergies: No Known Allergies  Social History: The patient  reports that he quit smoking about 10 years ago. His smoking use included cigarettes. He has a 38.00 pack-year smoking history. He has never used smokeless tobacco. He reports current alcohol use. He reports that he does not use drugs.   Family History: The patient's family history includes Dementia in his mother; Diabetes in his father; Heart disease in his father; Hypertension in his father and mother; Leukemia in his maternal uncle; Liver cancer in his maternal grandmother; Parkinsonism in his mother; Prostate cancer in his maternal uncle.   Review of Systems: Please see the history of present illness.   All other systems are reviewed and negative.   Physical Exam: VS:  BP 116/70   Pulse 86   Ht 5' 11"  (1.803 m)   Wt (!) 349 lb 12.8 oz (158.7 kg)   SpO2 98%   BMI 48.79 kg/m  .  BMI Body mass index is 48.79 kg/m.  Wt Readings from Last 3 Encounters:  05/08/19 (!) 349 lb 12.8 oz (158.7 kg)  01/19/19 (!) 351 lb (159.2 kg)  01/09/19 (!) 351  lb 1.9 oz (159.3 kg)    General: Pleasant. Very large stature. He is alert and in no acute distress.   Cardiac: His heart tones are distant. He has chronic massive edema of his legs - this is unchanged.  Respiratory:  Lungs are essentially clear to auscultation bilaterally with normal work of breathing.  GI: Obese.   MS: No deformity or atrophy. Gait and ROM intact.  Skin: Warm and dry. Color is normal.  Neuro:  Strength and sensation are intact and no gross focal deficits noted.  Psych: Alert, appropriate and with normal affect.   LABORATORY DATA:  EKG:  EKG is not ordered today.  Lab Results  Component Value Date   WBC 5.4 01/19/2019   HGB 12.2 (L) 01/19/2019   HCT 39.8 01/19/2019   PLT 138 (L) 01/19/2019   GLUCOSE 94 01/19/2019   CHOL 164 09/11/2018   TRIG 147 09/11/2018   HDL 53 09/11/2018   LDLCALC 82 09/11/2018   ALT 27 09/11/2018   AST 23 09/11/2018   NA 142 01/19/2019   K 4.0 01/19/2019   CL 107 01/19/2019   CREATININE 2.16 (H) 01/19/2019   BUN 26 (H) 01/19/2019   CO2 22 01/19/2019   TSH 1.630 09/11/2018   INR 1.31 06/30/2017   HGBA1C 5.9 (H) 05/14/2016     BNP (last 3 results) No results for input(s): BNP in the last 8760 hours.  ProBNP (last 3 results) No results for input(s): PROBNP in the last 8760 hours.   Other Studies Reviewed Today:  EchoStudy Conclusions2/2018  - Left ventricle: The cavity size was normal. Wall thickness was increased in a pattern of moderate LVH. Systolic function was normal. The estimated ejection fraction was in the range of 55% to 60%. Wall motion was normal; there were no regional wall motion abnormalities. The study is not technically sufficient to allow evaluation of LV diastolic function. - Aortic root: The aortic root was mildly dilated. - Mitral valve: Calcified annulus.  Impressions:  - Technically difficult; definity used; normal LV function; moderate LVH.   MyoviewStudy  Highlights12/2017   Nuclear stress EF: 58%.  There was no ST segment deviation noted during stress.  There is a large defect of severe severity present in the basal inferior, mid inferior, apical septal, apical inferior and apex location. The defect is non-reversible. No ischemia noted. Tthis is consistent with diaphragmatic attenuation artifact vs. Scar.  This is a low risk study.  The left ventricular ejection fraction is normal (55-65%).     Procedure: Left Heart Cath, [facility] Coronary Angiography, PTCA and stenting of the mid and distal LAD April 2016.  Coronary angiography: Left mainstem: The left mainstem is patent. The vessel divides into the LAD and left circumflex. There is no significant stenosis, but there is mild irregularity.  Left anterior descending (LAD): The LAD is moderately calcified. The vessel is severely diseased. The proximal LAD is patent with 30-40% stenosis after the second septal perforator. There is diffuse calcification. The diagonal branches are small. The mid LAD has an eccentric 75% stenosis at the origin of the second diagonal branch. Beyond that area the mid and distal LAD are diffusely diseased. There is another severe stenosis in the apical LAD of 90%.  Left circumflex (LCx): The left circumflex is calcified. The vessel is patent with mild irregularity. There are no high-grade stenoses identified. There are scattered 30-40% stenoses throughout the proximal and mid circumflex as well as the first OM branch.  Right coronary artery (RCA): The RCA is dominant. The vessel is heavily calcified. The stented segments in the mid and distal RCA are patent. The proximal vessel has 30-40% stenosis. The mid vessel has 30-40% stenosis. The distal vessel is patent with 50-60% stenosis involving the origin  of the PDA. The PDA is diffusely diseased.  Left ventriculography: Deferred because of chronic kidney disease. By nuclear scan the LVEF is  41%.  PCI Note: Following the diagnostic procedure, the decision was made to proceed with PCI of the mid and apical LAD. The right coronary artery had patent stents in the left circumflex had nonobstructive disease. The LAD is a diffusely diseased vessel and the mid and distal vessel would be a poor surgical targets. I felt that PCI was the best option in this patient with a high risk nuclear scan demonstrating anteroapical and anterolateral ischemia. He will require colostomy reversal, so I planned on treating him with Synergy drug-eluting stents which had a biodegradable polymer and potentially can allow for earlier interruption of dual antiplatelet therapy. The patient was loaded with Plavix 600 mg. Weight-based bivalirudin was given for anticoagulation. Once a therapeutic ACT was achieved, a 6 [place] XB LAD 3.5 cm guide catheter was inserted. A cougar coronary guidewire was used to cross the lesion in the apical LAD. The lesion was predilated with a 2.5 x 12 mm balloon. The same balloon was used to dilate the mid lesion. The apical lesion was then stented with a 2.25 x 16 mm Synergy DES. The stent was postdilated with a 2.5 mm noncompliant balloon. The mid lesion was then stented with a 3.5 x 16 mm Synergy DES. That stent was postdilated with a 3.75 mm noncompliant balloon. Following PCI, there was 0% residual stenosis and TIMI-3 flow. Final angiography confirmed an excellent result. The patient tolerated the procedure well. There were no immediate procedural complications. A TR band was used for radial hemostasis. The patient was transferred to the post catheterization recovery area for further monitoring.  PCI Data: Lesion 1: Vessel - LAD/Segment - distal/apical Percent Stenosis (pre) 90 TIMI-flow 3 Stent 2.25 x 16 mm Synergy DES Percent Stenosis (post) 0 TIMI-flow (post) 3  Lesion 2: Vessel - LAD/Segment - mid Percent Stenosis (pre) 75 TIMI-flow 3 Stent 3.5 x 16 mm Synergy  DES Percent Stenosis (post) 0 TIMI-flow (post) 3  Estimated Blood Loss: Minimal  Final Conclusions:  1. Two-vessel coronary artery disease with continued patency of the stented segments in the right coronary artery and severe stenoses of the mid and apical LAD 2. Mild diffuse nonobstructive left circumflex stenosis 3. Successful PCI of the LAD using 2 drug-eluting stents   Recommendations:  Dual antiplatelet therapy for at least 6 months. Would be reasonable to consider interruption of aspirin and Plavix at 6 months for colostomy reversal.  [name] MD, [facility] 06/11/2014, 10:06 AM   Assessment / Plan:  1. Intermittent atrial tach - chronic finding for many years - never symptomatic - managed medically. HR is ok today. He is not a candidate for EP procedures or AAD therapies. He has been managed conservatively.   2. Known CAD - prior PCI to the RCA and has had PCI to the LAD in 2016 with DES x 2 - on chronic DAPT - managed medically. No worrisome symptoms. Could consider Plavix as monotherapy on return. Strongly encouraged him to at least walk up and down his driveway.   3. Chronic combined systolic & diastolic HF - normal EF by last echo in 2018 - he has had massive lower extremity edema for many years that is unchanged. Not much to offer - fortunately, symptoms are stable.   4. Prior colon cancer - now with ileostomy after failed attempt to reverse his colostomy.   5. HTN - BP  is fine - no changes made today.   6. CKD - rechecking lab today.   7. History of PVCs - not noted.   8. HLD - on statin - lab today.   9.Prior[facility] procedures -he has been released from [facility] - Ihaverefilled his Flomaxlast yearto avoid urinary retention with potential for recurrent UTI, then possible recurrent sepsis, etc.Will be available as needed.This was not discussed today.  10. COVID-19 Education: The signs and symptoms of COVID-19 were discussed with the patient and how to seek  care for testing (follow up with PCP or arrange E-visit).  The importance of social distancing, staying at home, hand hygiene and wearing a mask when out in public were discussed today. He has had both COVID vaccines.   Current medicines are reviewed with the patient today.  The patient does not have concerns regarding medicines other than what has been noted above.  The following changes have been made:  See above.  Labs/ tests ordered today include:    Orders Placed This Encounter  Procedures  . Basic metabolic panel  . CBC  . Hepatic function panel  . Lipid panel     Disposition:   FU with me in 4 months.   Patient is agreeable to this plan and will call if any problems develop in the interim.   SignedTruitt Merle, NP  05/08/2019 10:00 AM  [facility] [address] [place], [place]  [address] Phone: [phone] Fax: [phone]

## 2019-05-06 ENCOUNTER — Ambulatory Visit: Payer: Medicare Other | Admitting: Nurse Practitioner

## 2019-05-08 ENCOUNTER — Encounter: Payer: Self-pay | Admitting: Nurse Practitioner

## 2019-05-08 ENCOUNTER — Other Ambulatory Visit: Payer: Self-pay

## 2019-05-08 ENCOUNTER — Ambulatory Visit (INDEPENDENT_AMBULATORY_CARE_PROVIDER_SITE_OTHER): Payer: Medicare Other | Admitting: Nurse Practitioner

## 2019-05-08 VITALS — BP 116/70 | HR 86 | Ht 71.0 in | Wt 349.8 lb

## 2019-05-08 DIAGNOSIS — E7800 Pure hypercholesterolemia, unspecified: Secondary | ICD-10-CM | POA: Diagnosis not present

## 2019-05-08 DIAGNOSIS — I259 Chronic ischemic heart disease, unspecified: Secondary | ICD-10-CM | POA: Diagnosis not present

## 2019-05-08 DIAGNOSIS — Z7189 Other specified counseling: Secondary | ICD-10-CM

## 2019-05-08 DIAGNOSIS — I5022 Chronic systolic (congestive) heart failure: Secondary | ICD-10-CM | POA: Diagnosis not present

## 2019-05-08 DIAGNOSIS — I471 Supraventricular tachycardia: Secondary | ICD-10-CM | POA: Diagnosis not present

## 2019-05-08 LAB — CBC
Hematocrit: 38.9 % (ref 37.5–51.0)
Hemoglobin: 12.7 g/dL — ABNORMAL LOW (ref 13.0–17.7)
MCH: 28.6 pg (ref 26.6–33.0)
MCHC: 32.6 g/dL (ref 31.5–35.7)
MCV: 88 fL (ref 79–97)
Platelets: 139 10*3/uL — ABNORMAL LOW (ref 150–450)
RBC: 4.44 x10E6/uL (ref 4.14–5.80)
RDW: 13.5 % (ref 11.6–15.4)
WBC: 5.8 10*3/uL (ref 3.4–10.8)

## 2019-05-08 LAB — BASIC METABOLIC PANEL
BUN/Creatinine Ratio: 11 (ref 10–24)
BUN: 23 mg/dL (ref 8–27)
CO2: 21 mmol/L (ref 20–29)
Calcium: 10 mg/dL (ref 8.6–10.2)
Chloride: 104 mmol/L (ref 96–106)
Creatinine, Ser: 2.15 mg/dL — ABNORMAL HIGH (ref 0.76–1.27)
GFR calc Af Amer: 36 mL/min/{1.73_m2} — ABNORMAL LOW (ref >59)
GFR calc non Af Amer: 31 mL/min/{1.73_m2} — ABNORMAL LOW (ref >59)
Glucose: 97 mg/dL (ref 65–99)
Potassium: 4.2 mmol/L (ref 3.5–5.2)
Sodium: 142 mmol/L (ref 134–144)

## 2019-05-08 LAB — HEPATIC FUNCTION PANEL
ALT: 18 IU/L (ref 0–44)
AST: 16 IU/L (ref 0–40)
Albumin: 4.4 g/dL (ref 3.8–4.8)
Alkaline Phosphatase: 142 IU/L — ABNORMAL HIGH (ref 39–117)
Bilirubin Total: 1.2 mg/dL (ref 0.0–1.2)
Bilirubin, Direct: 0.27 mg/dL (ref 0.00–0.40)
Total Protein: 7.2 g/dL (ref 6.0–8.5)

## 2019-05-08 LAB — LIPID PANEL
Chol/HDL Ratio: 2.6 ratio (ref 0.0–5.0)
Cholesterol, Total: 152 mg/dL (ref 100–199)
HDL: 58 mg/dL (ref >39)
LDL Chol Calc (NIH): 68 mg/dL (ref 0–99)
Triglycerides: 154 mg/dL — ABNORMAL HIGH (ref 0–149)
VLDL Cholesterol Cal: 26 mg/dL (ref 5–40)

## 2019-05-08 NOTE — Patient Instructions (Addendum)
After Visit Summary:  We will be checking the following labs today - BMET, CBC, HPF and Lipids   Medication Instructions:    Continue with your current medicines.    If you need a refill on your cardiac medications before your next appointment, please call your pharmacy.     Testing/Procedures To Be Arranged:  N/A  Follow-Up:   See me in 4 months    At [facility], you and your health needs are our priority.  As part of our continuing mission to provide you with exceptional heart care, we have created designated Provider Care Teams.  These Care Teams include your primary Cardiologist (physician) and Advanced Practice Providers (APPs -  Physician Assistants and Nurse Practitioners) who all work together to provide you with the care you need, when you need it.  Special Instructions:  . Stay safe, stay home, wash your hands for at least 20 seconds and wear a mask when out in public.  . It was good to talk with you today.    Call the [place] office at [phone] if you have any questions, problems or concerns.

## 2019-05-16 ENCOUNTER — Encounter (HOSPITAL_BASED_OUTPATIENT_CLINIC_OR_DEPARTMENT_OTHER): Payer: Medicare Other | Admitting: Internal Medicine

## 2019-05-17 ENCOUNTER — Other Ambulatory Visit: Payer: Self-pay | Admitting: Nurse Practitioner

## 2019-05-17 DIAGNOSIS — I471 Supraventricular tachycardia: Secondary | ICD-10-CM

## 2019-05-17 DIAGNOSIS — I5022 Chronic systolic (congestive) heart failure: Secondary | ICD-10-CM

## 2019-05-20 ENCOUNTER — Other Ambulatory Visit: Payer: Self-pay | Admitting: Nurse Practitioner

## 2019-05-20 DIAGNOSIS — R0602 Shortness of breath: Secondary | ICD-10-CM

## 2019-05-20 DIAGNOSIS — E7800 Pure hypercholesterolemia, unspecified: Secondary | ICD-10-CM

## 2019-05-20 DIAGNOSIS — I5022 Chronic systolic (congestive) heart failure: Secondary | ICD-10-CM

## 2019-05-20 DIAGNOSIS — I259 Chronic ischemic heart disease, unspecified: Secondary | ICD-10-CM

## 2019-05-20 DIAGNOSIS — I471 Supraventricular tachycardia: Secondary | ICD-10-CM

## 2019-05-21 ENCOUNTER — Other Ambulatory Visit: Payer: Self-pay

## 2019-05-21 ENCOUNTER — Encounter (HOSPITAL_BASED_OUTPATIENT_CLINIC_OR_DEPARTMENT_OTHER): Payer: Medicare Other | Attending: Internal Medicine | Admitting: Internal Medicine

## 2019-05-21 DIAGNOSIS — Z7902 Long term (current) use of antithrombotics/antiplatelets: Secondary | ICD-10-CM | POA: Diagnosis not present

## 2019-05-21 DIAGNOSIS — Z87891 Personal history of nicotine dependence: Secondary | ICD-10-CM | POA: Insufficient documentation

## 2019-05-21 DIAGNOSIS — N183 Chronic kidney disease, stage 3 unspecified: Secondary | ICD-10-CM | POA: Insufficient documentation

## 2019-05-21 DIAGNOSIS — X58XXXD Exposure to other specified factors, subsequent encounter: Secondary | ICD-10-CM | POA: Diagnosis not present

## 2019-05-21 DIAGNOSIS — I2510 Atherosclerotic heart disease of native coronary artery without angina pectoris: Secondary | ICD-10-CM | POA: Diagnosis not present

## 2019-05-21 DIAGNOSIS — S31105S Unspecified open wound of abdominal wall, periumbilic region without penetration into peritoneal cavity, sequela: Secondary | ICD-10-CM | POA: Diagnosis not present

## 2019-05-21 DIAGNOSIS — T8131XD Disruption of external operation (surgical) wound, not elsewhere classified, subsequent encounter: Secondary | ICD-10-CM | POA: Insufficient documentation

## 2019-05-21 DIAGNOSIS — Z955 Presence of coronary angioplasty implant and graft: Secondary | ICD-10-CM | POA: Diagnosis not present

## 2019-05-21 DIAGNOSIS — I130 Hypertensive heart and chronic kidney disease with heart failure and stage 1 through stage 4 chronic kidney disease, or unspecified chronic kidney disease: Secondary | ICD-10-CM | POA: Insufficient documentation

## 2019-05-21 DIAGNOSIS — G4733 Obstructive sleep apnea (adult) (pediatric): Secondary | ICD-10-CM | POA: Diagnosis not present

## 2019-05-21 DIAGNOSIS — Z6841 Body Mass Index (BMI) 40.0 and over, adult: Secondary | ICD-10-CM | POA: Diagnosis not present

## 2019-05-21 DIAGNOSIS — I5022 Chronic systolic (congestive) heart failure: Secondary | ICD-10-CM | POA: Diagnosis not present

## 2019-05-21 DIAGNOSIS — Z85038 Personal history of other malignant neoplasm of large intestine: Secondary | ICD-10-CM | POA: Diagnosis not present

## 2019-05-21 DIAGNOSIS — J449 Chronic obstructive pulmonary disease, unspecified: Secondary | ICD-10-CM | POA: Insufficient documentation

## 2019-05-21 DIAGNOSIS — I739 Peripheral vascular disease, unspecified: Secondary | ICD-10-CM | POA: Insufficient documentation

## 2019-05-21 DIAGNOSIS — Z933 Colostomy status: Secondary | ICD-10-CM | POA: Insufficient documentation

## 2019-05-21 DIAGNOSIS — Z7982 Long term (current) use of aspirin: Secondary | ICD-10-CM | POA: Insufficient documentation

## 2019-05-21 DIAGNOSIS — T8131XA Disruption of external operation (surgical) wound, not elsewhere classified, initial encounter: Secondary | ICD-10-CM | POA: Diagnosis not present

## 2019-05-23 ENCOUNTER — Encounter (HOSPITAL_BASED_OUTPATIENT_CLINIC_OR_DEPARTMENT_OTHER): Payer: Federal, State, Local not specified - PPO | Admitting: Internal Medicine

## 2019-05-27 ENCOUNTER — Other Ambulatory Visit: Payer: Self-pay | Admitting: Nurse Practitioner

## 2019-05-27 NOTE — Progress Notes (Signed)
Purk, Elmon L. ([id number]) Visit Report for 05/21/2019 HPI Details Patient Name: Date of Service: AYREN, STALNAKER 05/21/2019 10:15 AM Medical Record [id number] Patient Account Number: [id number] Date of Birth/Sex: Treating RN: 1950-07-18 (69 y.o. Jerilynn Mages) Carlene Coria Primary Care Provider: Lin Landsman Other Clinician: Referring Provider: Treating Provider/Extender:Jomari Bartnik, Fernanda Drum, [name] in Treatment: 51 History of Present Illness Location: Patient presents with a wound to left lower leg. Quality: Patient reports No Pain. Severity: Patient states wound(s) are getting worse. Duration: Patient has had the wound for < 2 weeks prior to presenting for treatment Modifying Factors: pvd HPI Description: pt smoked until 5 years ago. cancer surgery 1 year ago. has ruq stoma. cv studies last year revealed non-compressible vessels. recent pe by pcp yielded some heart abnormality and he is scheduled for stress test. 5/24 2 new wounds on rt leg. left side healed 08/12/14; several small wounds on the right lateral leg and one wound on the left leg which is new. The edema control here does not look adequate. There is also some degree of maceration around the wounds on the right lateral leg READMISSION 09/14/2018 This is a patient we had in this clinic in 2013 and then again in 2016 with wounds on his right lateral leg secondary to chronic venous insufficiency. He was cared for I believe in 2016 by Dr. Lindon Romp. The patient is here for review of 6 small wounds within a large surgical area on his abdomen. The patient had surgery for a malignant neoplasm of the sigmoid colon with a colostomy. I cannot see the date of the exact surgery in epic. In February 2018 he had an attempted colostomy reversal however he had a microperforation I believe at the anastomosis. He required a repeat laparotomy. He had an ileostomy raised at that time. I believe the surgical wound at that time was left  to close via secondary intention. The patient is unaware whether he had a wound VAC on this. In any case this almost is completely closed except he has been left with 6 small eraser head sized areas on the abdomen that have not healed. I am not exactly sure how he was how he is been dressing this. He has been followed with frequent visits in general surgery dating back at least over a year. Past medical history includes chronic systolic heart failure, stage III chronic renal failure. Malignant neoplasm of the sigmoid colon, morbid obesity. 7/31; not a lot of change 6 small wounds within a large surgical area on his abdomen. None of these are hyper granulated as opposed to last week. None of them have exposed mesh or surrounding infection. He has been using [facility] 8/14- He comes after 2 weeks, he has only 2 open wounds that I can tell this time, he has been using Hydrofera Blue 8/28; 2-week follow-up. Very superficial areas on the abdominal surgical site scar. He is using Hydrofera Blue and this seems to be doing a good job. He has not made it down to the medical supply store to see if he can get an abdominal binder or something to keep the tension off this wound area. READMISSION 01/04/2019 This is a 69 year old man we had in the clinic from July through August 2020. He had wounds in the midline abdominal scar. [Please see history from my note of 09/14/2018}. When he was here last time we used Hydrofera Blue. Things seem to be improving. He did not discharge in a healed state his last visit here was on  8/28. He states he continued to use the [facility] and at 1 point this was very close to healing but more recently it is reopened. The wounds are basically in the same state as last time superficial friable easily bleeding wounds. Some hyper granulation. He has an abdominal binder that he got at [facility] however he says it is too tight for him to get on. Past medical history is  reviewed; he has coronary artery disease, stage III chronic renal failure heart failure with reduced ejection fraction, COPD, hypertension, obstructive sleep apnea. He was hospitalized since he was here last time for a severe pharyngitis. 11/19; the patient was in the ER on 11/16; this was for oozing from his abdominal wound. He was cauterized with quick clot gauze and epi and silver nitrate. This was felt to be venous oozing. He has very friable wounds in this area. He is using his abdominal binder. 12/3; the patient was in the ER with bleeding from the lower part of his incision in the abdomen on 11/28. Had a stitch placed to contain the bleeding area. This was just underneath his major wound. He saw general surgery yesterday and apparently we are to remove the stitch in 2 weeks. The patient is on Plavix and aspirin for coronary artery disease with stents. His bleeding was felt to be capillary. 12/17; I removed the stitch from the bleeding area that was placed last time. This came out with some difficulty. The major area almost looks like slough with an area of cellophane over it. I did not attempt any further debridement 02/28/2019. The patient has new areas superiorly and inferiorly to the major wound in the middle. The area in the middle has a nonviable surface over perhaps 60 to 70% however debridement causes uncontrolled bleeding here. He is on Plavix. We have been usin hydrogel and Prisma. 1/21; the patient has 2 superficial areas superiorly he has the major wound in the middle and a small area inferiorly in the middle of this abdominal wound. We switch to Iodoflex last time 2/4; the patient has 3 open areas. 2 of these are on the right part of this surgical wound on his abdomen and one more on the left central. The 2 on the right are hyper granulated I used silver nitrate on these. The area on the left appears to have a fibrinous surface. I attempted to debride this once he ended up in the  ER. I am reluctant to do this again. We have been using Iodoflex 2/25; he has 3 open areas small ones proximally and distally and then the larger one in the middle. This has nonviable debris over fibrinous debris over the surface. He is using Iodoflex 3/30; I follow this man monthly. He has wounds in the middle of the surgical scar in his abdomen. Today 2 wounds which is an improvement. The major wound in the middle and one inferiorly and medially. Electronic Signature(s) Signed: 05/27/2019 7:44:34 AM By: Linton Ham MD Entered By: Linton Ham on 05/21/2019 13:26:00 -------------------------------------------------------------------------------- Physical Exam Details Patient Name: Date of Service: DRESHON, SCHEY 05/21/2019 10:15 AM Medical Record [id number] Patient Account Number: [id number] Date of Birth/Sex: Treating RN: 09/02/1950 (69 y.o. Jerilynn Mages) Carlene Coria Primary Care Provider: Lin Landsman Other Clinician: Referring Provider: Treating Provider/Extender:Rylynn Schoneman, Fernanda Drum, [name] in Treatment: 19 Constitutional Sitting or standing Blood Pressure is within target range for patient.. Pulse regular and within target range for patient.[name] Kitchen Respirations regular, non-labored and within target range.. Temperature is normal  and within the target range for the patient.[name] Kitchen Appears in no distress. Notes Wound exam; the areas in the mid part of the abdominal scar still distended although not as much as I used to seeing. His ostomy bag appears to be working well he is nontender. He has an area in the middle of the scar with a very adherent nonviable surface. Debridement that I did several months ago in this area resulted in uncontrolled bleeding and a trip to the ER. He has a small more superficial wound inferiorly and medially. Electronic Signature(s) Signed: 05/27/2019 7:44:34 AM By: Linton Ham MD Entered By: Linton Ham on 05/21/2019  13:26:57 -------------------------------------------------------------------------------- Physician Orders Details Patient Name: Date of Service: BRONC, BUCKBEE 05/21/2019 10:15 AM Medical Record [id number] Patient Account Number: [id number] Date of Birth/Sex: Treating RN: May 27, 1950 (69 y.o. Jerilynn Mages) Carlene Coria Primary Care Provider: Lin Landsman Other Clinician: Referring Provider: Treating Provider/Extender:Thelma Lorenzetti, Fernanda Drum, [name] in Treatment: 59 Verbal / Phone Orders: No Diagnosis Coding ICD-10 Coding Code Description Disruption of external operation (surgical) wound, not elsewhere classified, subsequent T81.31XD encounter Unspecified open wound of abdominal wall, periumbilic region without penetration into peritoneal S31.105S cavity, sequela Follow-up Appointments Return appointment in 1 month. - earlier in the day. Dressing Change Frequency Change Dressing every other day. Wound Cleansing May shower and wash wound with soap and water. - with dressing change Primary Wound Dressing Wound #10 Proximal Abdomen - midline Iodoflex Wound #8 Abdomen - midline Iodoflex Wound #9 Distal Abdomen - midline Iodoflex Secondary Dressing Dry Gauze ABD pad Other: - abdominal binder while up during the day. may remove at night. Electronic Signature(s) Signed: 05/22/2019 5:40:01 PM By: Carlene Coria RN Signed: 05/27/2019 7:44:34 AM By: Linton Ham MD Entered By: Carlene Coria on 05/21/2019 11:24:32 -------------------------------------------------------------------------------- Problem List Details Patient Name: Date of Service: CJAY, HELMES 05/21/2019 10:15 AM Medical Record [id number] Patient Account Number: [id number] Date of Birth/Sex: Treating RN: 1950-10-12 (69 y.o. Jerilynn Mages) Carlene Coria Primary Care Provider: Lin Landsman Other Clinician: Referring Provider: Treating Provider/Extender:Lavonna Lampron, Fernanda Drum, [name] in Treatment:  19 Active Problems ICD-10 Evaluated Encounter Code Description Active Date Today Diagnosis T81.31XD Disruption of external operation (surgical) wound, not 01/04/2019 No Yes elsewhere classified, subsequent encounter S31.105S Unspecified open wound of abdominal wall, [id number] No Yes periumbilic region without penetration into peritoneal cavity, sequela Inactive Problems Resolved Problems Electronic Signature(s) Signed: 05/27/2019 7:44:34 AM By: Linton Ham MD Entered By: Linton Ham on 05/21/2019 13:25:04 -------------------------------------------------------------------------------- Progress Note Details Patient Name: Date of Service: [name] 05/21/2019 10:15 AM Medical Record [id number] Patient Account Number: [id number] Date of Birth/Sex: Treating RN: 1950-06-14 (69 y.o. Jerilynn Mages) Carlene Coria Primary Care Provider: Lin Landsman Other Clinician: Referring Provider: Treating Provider/Extender:Shaden Lacher, Fernanda Drum, [name] in Treatment: 19 Subjective History of Present Illness (HPI) The following HPI elements were documented for the patient's wound: Location: Patient presents with a wound to left lower leg. Quality: Patient reports No Pain. Severity: Patient states wound(s) are getting worse. Duration: Patient has had the wound for < 2 weeks prior to presenting for treatment Modifying Factors: pvd pt smoked until 5 years ago. cancer surgery 1 year ago. has ruq stoma. cv studies last year revealed non- compressible vessels. recent pe by pcp yielded some heart abnormality and he is scheduled for stress test. 5/24 2 new wounds on rt leg. left side healed 08/12/14; several small wounds on the right lateral leg and one wound on the left leg which is new. The edema control here does not look adequate.  There is also some degree of maceration around the wounds on the right lateral leg READMISSION 09/14/2018 This is a patient we had in this clinic in 2013  and then again in 2016 with wounds on his right lateral leg secondary to chronic venous insufficiency. He was cared for I believe in 2016 by Dr. Lindon Romp. The patient is here for review of 6 small wounds within a large surgical area on his abdomen. The patient had surgery for a malignant neoplasm of the sigmoid colon with a colostomy. I cannot see the date of the exact surgery in epic. In February 2018 he had an attempted colostomy reversal however he had a microperforation I believe at the anastomosis. He required a repeat laparotomy. He had an ileostomy raised at that time. I believe the surgical wound at that time was left to close via secondary intention. The patient is unaware whether he had a wound VAC on this. In any case this almost is completely closed except he has been left with 6 small eraser head sized areas on the abdomen that have not healed. I am not exactly sure how he was how he is been dressing this. He has been followed with frequent visits in general surgery dating back at least over a year. Past medical history includes chronic systolic heart failure, stage III chronic renal failure. Malignant neoplasm of the sigmoid colon, morbid obesity. 7/31; not a lot of change 6 small wounds within a large surgical area on his abdomen. None of these are hyper granulated as opposed to last week. None of them have exposed mesh or surrounding infection. He has been using [facility] 8/14- He comes after 2 weeks, he has only 2 open wounds that I can tell this time, he has been using Hydrofera Blue 8/28; 2-week follow-up. Very superficial areas on the abdominal surgical site scar. He is using Hydrofera Blue and this seems to be doing a good job. He has not made it down to the medical supply store to see if he can get an abdominal binder or something to keep the tension off this wound area. READMISSION 01/04/2019 This is a 69 year old man we had in the clinic from July through August 2020.  He had wounds in the midline abdominal scar. [Please see history from my note of 09/14/2018}. When he was here last time we used Hydrofera Blue. Things seem to be improving. He did not discharge in a healed state his last visit here was on 8/28. He states he continued to use the [facility] and at 1 point this was very close to healing but more recently it is reopened. The wounds are basically in the same state as last time superficial friable easily bleeding wounds. Some hyper granulation. He has an abdominal binder that he got at [facility] however he says it is too tight for him to get on. Past medical history is reviewed; he has coronary artery disease, stage III chronic renal failure heart failure with reduced ejection fraction, COPD, hypertension, obstructive sleep apnea. He was hospitalized since he was here last time for a severe pharyngitis. 11/19; the patient was in the ER on 11/16; this was for oozing from his abdominal wound. He was cauterized with quick clot gauze and epi and silver nitrate. This was felt to be venous oozing. He has very friable wounds in this area. He is using his abdominal binder. 12/3; the patient was in the ER with bleeding from the lower part of his incision in the  abdomen on 11/28. Had a stitch placed to contain the bleeding area. This was just underneath his major wound. He saw general surgery yesterday and apparently we are to remove the stitch in 2 weeks. The patient is on Plavix and aspirin for coronary artery disease with stents. His bleeding was felt to be capillary. 12/17; I removed the stitch from the bleeding area that was placed last time. This came out with some difficulty. The major area almost looks like slough with an area of cellophane over it. I did not attempt any further debridement 02/28/2019. The patient has new areas superiorly and inferiorly to the major wound in the middle. The area in the middle has a nonviable surface over perhaps 60  to 70% however debridement causes uncontrolled bleeding here. He is on Plavix. We have been usin hydrogel and Prisma. 1/21; the patient has 2 superficial areas superiorly he has the major wound in the middle and a small area inferiorly in the middle of this abdominal wound. We switch to Iodoflex last time 2/4; the patient has 3 open areas. 2 of these are on the right part of this surgical wound on his abdomen and one more on the left central. The 2 on the right are hyper granulated I used silver nitrate on these. The area on the left appears to have a fibrinous surface. I attempted to debride this once he ended up in the ER. I am reluctant to do this again. We have been using Iodoflex 2/25; he has 3 open areas small ones proximally and distally and then the larger one in the middle. This has nonviable debris over fibrinous debris over the surface. He is using Iodoflex 3/30; I follow this man monthly. He has wounds in the middle of the surgical scar in his abdomen. Today 2 wounds which is an improvement. The major wound in the middle and one inferiorly and medially. Objective Constitutional Sitting or standing Blood Pressure is within target range for patient.. Pulse regular and within target range for patient.[name] Kitchen Respirations regular, non-labored and within target range.. Temperature is normal and within the target range for the patient.[name] Kitchen Appears in no distress. Vitals Time Taken: 11:00 AM, Height: 71 in, Weight: 344 lbs, BMI: 48, Temperature: 98.1 F, Pulse: 90 bpm, Respiratory Rate: 20 breaths/min, Blood Pressure: 111/67 mmHg. General Notes: Wound exam; the areas in the mid part of the abdominal scar still distended although not as much as I used to seeing. His ostomy bag appears to be working well he is nontender. He has an area in the middle of the scar with a very adherent nonviable surface. Debridement that I did several months ago in this area resulted in uncontrolled bleeding and a trip  to the ER. He has a small more superficial wound inferiorly and medially. Integumentary (Hair, Skin) Wound #10 status is Open. Original cause of wound was Gradually Appeared. The wound is located on the Proximal Abdomen - midline. The wound measures 0.4cm length x 0.3cm width x 0.1cm depth; 0.094cm^2 area and 0.009cm^3 volume. There is Fat Layer (Subcutaneous Tissue) Exposed exposed. There is no tunneling or undermining noted. There is a small amount of serosanguineous drainage noted. The wound margin is distinct with the outline attached to the wound base. There is large (67-100%) pink, hyper - granulation within the wound bed. There is no necrotic tissue within the wound bed. Wound #8 status is Open. Original cause of wound was Surgical Injury. The wound is located on the Abdomen - midline. The  wound measures 2.7cm length x 1.2cm width x 0.1cm depth; 2.545cm^2 area and 0.254cm^3 volume. There is Fat Layer (Subcutaneous Tissue) Exposed exposed. There is no tunneling or undermining noted. There is a medium amount of serosanguineous drainage noted. The wound margin is flat and intact. There is medium (34-66%) pink, pale granulation within the wound bed. There is a medium (34-66%) amount of necrotic tissue within the wound bed including Adherent Slough. Wound #9 status is Open. Original cause of wound was Gradually Appeared. The wound is located on the Distal Abdomen - midline. The wound measures 0.6cm length x 0.7cm width x 0.1cm depth; 0.33cm^2 area and 0.033cm^3 volume. There is Fat Layer (Subcutaneous Tissue) Exposed exposed. There is no tunneling or undermining noted. There is a small amount of serosanguineous drainage noted. The wound margin is distinct with the outline attached to the wound base. There is large (67-100%) pink, hyper - granulation within the wound bed. There is no necrotic tissue within the wound bed. Assessment Active Problems ICD-10 Disruption of external operation  (surgical) wound, not elsewhere classified, subsequent encounter Unspecified open wound of abdominal wall, periumbilic region without penetration into peritoneal cavity, sequela Plan Follow-up Appointments: Return appointment in 1 month. - earlier in the day. Dressing Change Frequency: Change Dressing every other day. Wound Cleansing: May shower and wash wound with soap and water. - with dressing change Primary Wound Dressing: Wound #10 Proximal Abdomen - midline: Iodoflex Wound #8 Abdomen - midline: Iodoflex Wound #9 Distal Abdomen - midline: Iodoflex Secondary Dressing: Dry Gauze ABD pad Other: - abdominal binder while up during the day. may remove at night. 1. At this point I think we should continue with the Iodoflex which is done better than anything else I put on this scarred area. 2. He does not seem to have as much distention here which also might be helpful. 3. There is no current evidence of infection. Electronic Signature(s) Signed: 05/27/2019 7:44:34 AM By: Linton Ham MD Entered By: Linton Ham on 05/21/2019 13:28:06 -------------------------------------------------------------------------------- SuperBill Details Patient Name: Date of Service: JANETTE, DOUROS 05/21/2019 Medical Record [id number] Patient Account Number: [id number] Date of Birth/Sex: Treating RN: [birth year] (69 y.o. Jerilynn Mages) Carlene Coria Primary Care Provider: Lin Landsman Other Clinician: Referring Provider: Treating Provider/Extender:Latia Mataya, Fernanda Drum, [name] in Treatment: 19 Diagnosis Coding ICD-10 Codes Code Description Disruption of external operation (surgical) wound, not elsewhere classified, subsequent T81.31XD encounter Unspecified open wound of abdominal wall, periumbilic region without penetration into peritoneal S31.105S cavity, sequela Facility Procedures The patient participates with Medicare or their insurance follows the Medicare Facility Guidelines: CPT4  Code Description Modifier Quantity [id number] Homer VISIT-LEV 3 EST PT 1 Physician Procedures CPT4: Code [id number] Description: [id number] - WC PHYS LEVEL 2 - EST PT ICD-10 Diagnosis Description T81.31XD Disruption of external operation (surgical) wound, not elsew subsequent encounter S31.105S Unspecified open wound of abdominal wall, periumbilic region into peritoneal  cavity, sequela Modifier: here classifie without penet Quantity: 1 d, ration Electronic Signature(s) Signed: 05/27/2019 7:44:34 AM By: Linton Ham MD Entered By: Linton Ham on 05/21/2019 13:28:35

## 2019-06-05 NOTE — Progress Notes (Signed)
Godshall, Yaakov L. ([id number]) Visit Report for 05/21/2019 Arrival Information Details Patient Name: Date of Service: Larry [name], Larry [name] 05/21/2019 10:15 AM Medical Record [id number] Patient Account Number: [id number] Date of Birth/Sex: Treating RN: [birth year] (68 y.o. Jerilynn Mages) Carlene Coria Primary Care Dmitry Macomber: Lin Landsman Other Clinician: Referring Taheera Thomann: Treating Dhairya Corales/Extender:Robson, Fernanda Drum, [name] in Treatment: 37 Visit Information History Since Last Visit Added or deleted any medications: No Patient Arrived: Ambulatory Any new allergies or adverse reactions: No Arrival Time: 10:59 Had a fall or experienced change in No Accompanied By: self activities of daily living that may affect Transfer Assistance: None risk of falls: Patient Identification Verified: Yes Signs or symptoms of abuse/neglect since last No Secondary Verification Process Completed: Yes visito Patient Requires Transmission-Based No Hospitalized since last visit: No Precautions: Implantable device outside of the clinic excluding No Patient Has Alerts: No cellular tissue based products placed in the center since last visit: Has Dressing in Place as Prescribed: Yes Pain Present Now: No Electronic Signature(s) Signed: 06/05/2019 9:21:08 AM By: Sandre Kitty Entered By: Sandre Kitty on 05/21/2019 11:00:10 -------------------------------------------------------------------------------- Clinic Level of Care Assessment Details Patient Name: Date of Service: Larry [name], Larry [name] 05/21/2019 10:15 AM Medical Record [id number] Patient Account Number: [id number] Date of Birth/Sex: Treating RN: Oct 04, 1950 (68 y.o. Jerilynn Mages) Carlene Coria Primary Care Vernis Cabacungan: Lin Landsman Other Clinician: Referring Waldine Zenz: Treating Dayshon Roback/Extender:Robson, Fernanda Drum, [name] in Treatment: 19 Clinic Level of Care Assessment Items TOOL 4 Quantity Score X - Use when only an EandM is  performed on FOLLOW-UP visit 1 0 ASSESSMENTS - Nursing Assessment / Reassessment X - Reassessment of Co-morbidities (includes updates in patient status) 1 10 X - Reassessment of Adherence to Treatment Plan 1 5 ASSESSMENTS - Wound and Skin Assessment / Reassessment []  - Simple Wound Assessment / Reassessment - one wound 0 X - Complex Wound Assessment / Reassessment - multiple wounds 3 5 []  - Dermatologic / Skin Assessment (not related to wound area) 0 ASSESSMENTS - Focused Assessment []  - Circumferential Edema Measurements - multi extremities 0 []  - Nutritional Assessment / Counseling / Intervention 0 []  - Lower Extremity Assessment (monofilament, tuning fork, pulses) 0 []  - Peripheral Arterial Disease Assessment (using hand held doppler) 0 ASSESSMENTS - Ostomy and/or Continence Assessment and Care []  - Incontinence Assessment and Management 0 []  - Ostomy Care Assessment and Management (repouching, etc.) 0 PROCESS - Coordination of Care []  - Simple Patient / Family Education for ongoing care 0 []  - Complex (extensive) Patient / Family Education for ongoing care 0 []  - Staff obtains Programmer, systems, Records, Test Results / Process Orders 0 []  - Staff telephones HHA, Nursing Homes / Clarify orders / etc 0 []  - Routine Transfer to another Facility (non-emergent condition) 0 []  - Routine Hospital Admission (non-emergent condition) 0 []  - New Admissions / Biomedical engineer / Ordering NPWT, Apligraf, etc. 0 []  - Emergency Hospital Admission (emergent condition) 0 X - Simple Discharge Coordination 1 10 []  - Complex (extensive) Discharge Coordination 0 PROCESS - Special Needs []  - Pediatric / Minor Patient Management 0 []  - Isolation Patient Management 0 []  - Hearing / Language / Visual special needs 0 []  - Assessment of Community assistance (transportation, D/C planning, etc.) 0 []  - Additional assistance / Altered mentation 0 []  - Support Surface(s) Assessment (bed, cushion, seat, etc.)  0 INTERVENTIONS - Wound Cleansing / Measurement []  - Simple Wound Cleansing - one wound 0 X - Complex Wound Cleansing - multiple wounds 3 5 X - Wound Imaging (photographs - any number  of wounds) 1 5 []  - Wound Tracing (instead of photographs) 0 []  - Simple Wound Measurement - one wound 0 X - Complex Wound Measurement - multiple wounds 3 5 INTERVENTIONS - Wound Dressings []  - Small Wound Dressing one or multiple wounds 0 []  - Medium Wound Dressing one or multiple wounds 0 X - Large Wound Dressing one or multiple wounds 1 20 []  - Application of Medications - topical 0 []  - Application of Medications - injection 0 INTERVENTIONS - Miscellaneous []  - External ear exam 0 []  - Specimen Collection (cultures, biopsies, blood, body fluids, etc.) 0 []  - Specimen(s) / Culture(s) sent or taken to Lab for analysis 0 []  - Patient Transfer (multiple staff / Civil Service fast streamer / Similar devices) 0 []  - Simple Staple / Suture removal (25 or less) 0 []  - Complex Staple / Suture removal (26 or more) 0 []  - Hypo / Hyperglycemic Management (close monitor of Blood Glucose) 0 []  - Ankle / Brachial Index (ABI) - do not check if billed separately 0 X - Vital Signs 1 5 Has the patient been seen at the hospital within the last three years: Yes Total Score: 100 Level Of Care: New/Established - Level 3 Electronic Signature(s) Signed: 05/22/2019 5:40:01 PM By: Carlene Coria RN Entered By: Carlene Coria on 05/21/2019 11:33:27 -------------------------------------------------------------------------------- Encounter Discharge Information Details Patient Name: Date of Service: Larry [name] 05/21/2019 10:15 AM Medical Record [id number] Patient Account Number: [id number] Date of Birth/Sex: Treating RN: 11-04-1950 (69 y.o. [name] Primary Care Leemon Ayala: Lin Landsman Other Clinician: Referring Tejal Monroy: Treating Royce Stegman/Extender:Robson, Fernanda Drum, [name] in Treatment: 47 Encounter  Discharge Information Items Discharge Condition: Stable Ambulatory Status: Ambulatory Discharge Destination: Home Transportation: Private Auto Accompanied By: self Schedule Follow-up Appointment: Yes Clinical Summary of Care: Patient Declined Electronic Signature(s) Signed: 05/27/2019 4:55:48 PM By: Kela Millin Entered By: Kela Millin on 05/21/2019 11:42:44 -------------------------------------------------------------------------------- Lower Extremity Assessment Details Patient Name: Date of Service: Larry [name], Larry [name] 05/21/2019 10:15 AM Medical Record [id number] Patient Account Number: [id number] Date of Birth/Sex: Treating RN: Jan 07, 1951 (68 y.o. Jerilynn Mages) Carlene Coria Primary Care Sutter Ahlgren: Lin Landsman Other Clinician: Referring Javontae Marlette: Treating Makhya Arave/Extender:Robson, Fernanda Drum, [name] in Treatment: 19 Electronic Signature(s) Signed: 05/22/2019 5:40:01 PM By: Carlene Coria RN Signed: 06/05/2019 9:21:08 AM By: Sandre Kitty Entered By: Sandre Kitty on 05/21/2019 11:00:30 -------------------------------------------------------------------------------- Multi Wound Chart Details Patient Name: Date of Service: Larry [name] 05/21/2019 10:15 AM Medical Record [id number] Patient Account Number: [id number] Date of Birth/Sex: Treating RN: 12-23-1950 (68 y.o. Jerilynn Mages) Dolores Lory, Morey Hummingbird Primary Care Keysean Savino: Lin Landsman Other Clinician: Referring Taegen Delker: Treating Abie Cheek/Extender:Robson, Fernanda Drum, [name] in Treatment: 19 Vital Signs Height(in): 2 Pulse(bpm): 59 Weight(lbs): 63 Blood Pressure(mmHg): 111/67 Body Mass Index(BMI): 48 Temperature(F): 98.1 Respiratory 20 Rate(breaths/min): Photos: [10:No Photos] [8:No Photos] [9:No Photos] Wound Location: [10:Abdomen - midline - Proximal] [8:Abdomen - midline] [9:Abdomen - midline - Distal] Wounding Event: [10:Gradually Appeared] [8:Surgical Injury] [9:Gradually  Appeared] Primary Etiology: [10:Open Surgical Wound] [8:Open Surgical Wound] [9:Open Surgical Wound] Comorbid History: [10:Asthma, Chronic Obstructive Pulmonary Disease (COPD), Sleep Apnea, Arrhythmia, Congestive Heart Failure, Congestive Heart Failure, Congestive Heart Failure, Coronary Artery Disease, Coronary Artery Disease, Coronary Artery Disease,  Hypertension, Peripheral Hypertension, Peripheral Hypertension, Peripheral Arterial Disease, Peripheral Arterial Disease, Peripheral Arterial Disease, Peripheral Venous Disease] [8:Asthma, Chronic Obstructive Pulmonary Disease (COPD), Sleep Apnea,  Arrhythmia, Venous Disease] [9:Asthma, Chronic Obstructive Pulmonary Disease (COPD), Sleep Apnea, Arrhythmia, Venous Disease] Date Acquired: [10:02/28/2019] [8:02/21/2017] [9:02/28/2019] Weeks of Treatment: [10:11] [8:19] [9:11] Wound Status: [10:Open] [8:Open] [9:Open] Clustered Wound: [  10:Yes] [8:Yes] [9:No] Clustered Quantity: [10:1] [8:N/A] [9:N/A] Measurements L x W x D 0.4x0.3x0.1 [8:2.7x1.2x0.1] [9:0.6x0.7x0.1] (cm) Area (cm) : [10:0.094] [8:2.545] [9:0.33] Volume (cm) : [10:0.009] [8:0.254] [9:0.033] % Reduction in Area: [10:0.00%] [8:96.80%] [9:65.00%] % Reduction in Volume: 0.00% [8:96.80%] [9:64.90%] Classification: [10:Full Thickness Without Exposed Support Structures Exposed Support Structures Exposed Support Structures] [8:Full Thickness Without] [9:Full Thickness Without] Exudate Amount: [10:Small] [8:Medium] [9:Small] Exudate Type: [10:Serosanguineous] [8:Serosanguineous] [9:Serosanguineous] Exudate Color: [10:red, brown] [8:red, brown] [9:red, brown] Wound Margin: [10:Distinct, outline attached Flat and Intact] [9:Distinct, outline attached] Granulation Amount: [10:Large (67-100%)] [8:Medium (34-66%)] [9:Large (67-100%)] Granulation Quality: [10:Pink, Hyper-granulation] [8:Pink, Pale] [9:Pink, Hyper-granulation] Necrotic Amount: [10:None Present (0%)] [8:Medium (34-66%)] [9:None Present  (0%)] Exposed Structures: [10:Fat Layer (Subcutaneous Fat Layer (Subcutaneous Fat Layer (Subcutaneous Tissue) Exposed: Yes Fascia: No Tendon: No Muscle: No Joint: No Bone: No Medium (34-66%)] [8:Tissue) Exposed: Yes Fascia: No Tendon: No Muscle: No Joint: No Bone: No Small  (1-33%)] [9:Tissue) Exposed: Yes Fascia: No Tendon: No Muscle: No Joint: No Bone: No Medium (34-66%)] Treatment Notes Wound #10 (Proximal Abdomen - midline) 1. Cleanse With Wound Cleanser 2. Periwound Care Skin Prep 3. Primary Dressing Applied Iodoflex 4. Secondary Dressing ABD Pad Dry Gauze 5. Secured With Tape Wound #8 (Abdomen - midline) 1. Cleanse With Wound Cleanser 2. Periwound Care Skin Prep 3. Primary Dressing Applied Iodoflex 4. Secondary Dressing ABD Pad Dry Gauze 5. Secured With Tape Wound #9 (Distal Abdomen - midline) 1. Cleanse With Wound Cleanser 2. Periwound Care Skin Prep 3. Primary Dressing Applied Iodoflex 4. Secondary Dressing ABD Pad Dry Gauze 5. Secured With Recruitment consultant) Signed: 05/22/2019 5:40:01 PM By: Carlene Coria RN Signed: 05/27/2019 7:44:34 AM By: Linton Ham MD Entered By: Linton Ham on 05/21/2019 13:25:13 -------------------------------------------------------------------------------- Multi-Disciplinary Care Plan Details Patient Name: Date of Service: Larry [name], Larry [name] 05/21/2019 10:15 AM Medical Record [id number] Patient Account Number: [id number] Date of Birth/Sex: Treating RN: 1950-12-19 (68 y.o. Jerilynn Mages) Carlene Coria Primary Care Sherry Blackard: Lin Landsman Other Clinician: Referring Czar Ysaguirre: Treating Romaldo Saville/Extender:Robson, Fernanda Drum, [name] in Treatment: 31 Active Inactive Nutrition Nursing Diagnoses: Potential for alteratiion in Nutrition/Potential for imbalanced nutrition Goals: Patient/caregiver agrees to and verbalizes understanding of need to obtain nutritional consultation Date Initiated: 03/28/2019 Target  Resolution Date: 05/03/2019 Goal Status: Active Interventions: Provide education on nutrition Treatment Activities: Education provided on Nutrition : 04/18/2019 Notes: Electronic Signature(s) Signed: 05/22/2019 5:40:01 PM By: Carlene Coria RN Entered By: Carlene Coria on 05/21/2019 11:24:48 -------------------------------------------------------------------------------- Pain Assessment Details Patient Name: Date of Service: Larry [name], Larry [name] 05/21/2019 10:15 AM Medical Record [id number] Patient Account Number: [id number] Date of Birth/Sex: Treating RN: [birth year] (68 y.o. Jerilynn Mages) Carlene Coria Primary Care Lyna Laningham: Lin Landsman Other Clinician: Referring Mako Pelfrey: Treating Stiven Kaspar/Extender:Robson, Fernanda Drum, [name] in Treatment: 19 Active Problems Location of Pain Severity and Description of Pain Patient Has Paino No Site Locations Pain Management and Medication Current Pain Management: Electronic Signature(s) Signed: 05/22/2019 5:40:01 PM By: Carlene Coria RN Signed: 06/05/2019 9:21:08 AM By: Sandre Kitty Entered By: Sandre Kitty on 05/21/2019 11:00:23 -------------------------------------------------------------------------------- Patient/Caregiver Education Details Larry Marvel 3/30/2021andnbsp10:15 Patient Name: Date of Service: L. AM Medical Record Patient Account Number: [id number] Number: Treating RN: Carlene Coria Date of Birth/Gender: January 02, 1951 (68 y.o. M) Other Clinician: Primary Care Physician: Sigurd Sos Referring Physician: Physician/Extender: Katharine Look in Treatment: 90 Education Assessment Education Provided To: Patient Education Topics Provided Nutrition: Methods: Explain/Verbal Responses: State content correctly Electronic Signature(s) Signed: 05/22/2019 5:40:01 PM By: Carlene Coria RN Entered By: Carlene Coria on 05/21/2019  11:25:24 -------------------------------------------------------------------------------- Wound Assessment Details Patient Name: Date of Service: Larry [name], Larry [name] 05/21/2019 10:15 AM Medical Record [id number] Patient Account Number: [id number] Date of Birth/Sex: Treating RN: 04/05/1950 (68 y.o. Jerilynn Mages) Carlene Coria Primary Care Adali Pennings: Lin Landsman Other Clinician: Referring Hodge Stachnik: Treating Jamesina Gaugh/Extender:Robson, Fernanda Drum, [name] in Treatment: 19 Wound Status Wound Number: 10 Primary Open Surgical Wound Etiology: Wound Location: Abdomen - midline - Proximal Wound Open Wounding Event: Gradually Appeared Status: Date Acquired: 02/28/2019 Comorbid Asthma, Chronic Obstructive Pulmonary Weeks Of Treatment: 11 History: Disease (COPD), Sleep Apnea, Arrhythmia, Clustered Wound: Yes Congestive Heart Failure, Coronary Artery Disease, Hypertension, Peripheral Arterial Disease, Peripheral Venous Disease Photos Photo Uploaded By: Mikeal Hawthorne on 05/24/2019 10:13:28 Wound Measurements Length: (cm) 0.4 % Reduct Width: (cm) 0.3 % Reduct Depth: (cm) 0.1 Epitheli Clustered Quantity: 1 Tunnelin Area: (cm) 0.094 Undermi Volume: (cm) 0.009 Wound Description Classification: Full Thickness Without Exposed Support Foul Odo Structures Slough/F Wound Distinct, outline attached Margin: Exudate Small Amount: Exudate Serosanguineous Type: Exudate red, brown Color: Wound Bed Granulation Amount: Large (67-100%) Granulation Quality: Pink, Hyper-granulation Fascia E Necrotic Amount: None Present (0%) Fat Layer Tendon Exp Muscle Exp Joint Expo Bone Expos r After Cleansing: No ibrino No Exposed Structure xposed: No (Subcutaneous Tissue) Exposed: Yes osed: No osed: No sed: No ed: No ion in Area: 0% ion in Volume: 0% alization: Medium (34-66%) g: No ning: No Treatment Notes Wound #10 (Proximal Abdomen - midline) 1. Cleanse With Wound Cleanser 2.  Periwound Care Skin Prep 3. Primary Dressing Applied Iodoflex 4. Secondary Dressing ABD Pad Dry Gauze 5. Secured With Recruitment consultant) Signed: 05/22/2019 5:08:19 PM By: Deon Pilling Signed: 05/22/2019 5:40:01 PM By: Carlene Coria RN Entered By: Deon Pilling on 05/21/2019 11:27:21 -------------------------------------------------------------------------------- Wound Assessment Details Patient Name: Date of Service: Larry [name], Larry [name] 05/21/2019 10:15 AM Medical Record [id number] Patient Account Number: [id number] Date of Birth/Sex: Treating RN: 1950/07/16 (68 y.o. Jerilynn Mages) Carlene Coria Primary Care Rhina Kramme: Lin Landsman Other Clinician: Referring Mica Ramdass: Treating Klayton Monie/Extender:Robson, Fernanda Drum, [name] in Treatment: 19 Wound Status Wound Number: 8 Primary Open Surgical Wound Etiology: Wound Location: Abdomen - midline Wound Open Wounding Event: Surgical Injury Status: Date Acquired: 02/21/2017 Comorbid Asthma, Chronic Obstructive Pulmonary Weeks Of Treatment: 19 History: Disease (COPD), Sleep Apnea, Arrhythmia, Clustered Wound: Yes Congestive Heart Failure, Coronary Artery Disease, Hypertension, Peripheral Arterial Disease, Peripheral Venous Disease Photos Photo Uploaded By: Mikeal Hawthorne on 05/24/2019 10:13:45 Wound Measurements Length: (cm) 2.7 % Reduct Width: (cm) 1.2 % Reduct Depth: (cm) 0.1 Epitheli Area: (cm) 2.545 Tunneli Volume: (cm) 0.254 Undermi Wound Description Full Thickness Without Exposed Support Foul Odo Classification: Structures Slough/F Wound Flat and Intact Margin: Exudate Medium Amount: Exudate Serosanguineous Type: Exudate red, brown Color: Wound Bed Granulation Amount: Medium (34-66%) Granulation Quality: Pink, Pale Fascia E Necrotic Amount: Medium (34-66%) Fat Laye Necrotic Quality: Adherent Slough Tendon E Muscle E Joint Ex Bone Exp r After Cleansing: No ibrino No Exposed Structure xposed:  No r (Subcutaneous Tissue) Exposed: Yes xposed: No xposed: No posed: No osed: No ion in Area: 96.8% ion in Volume: 96.8% alization: Small (1-33%) ng: No ning: No Treatment Notes Wound #8 (Abdomen - midline) 1. Cleanse With Wound Cleanser 2. Periwound Care Skin Prep 3. Primary Dressing Applied Iodoflex 4. Secondary Dressing ABD Pad Dry Gauze 5. Secured With Recruitment consultant) Signed: 05/22/2019 5:08:19 PM By: Deon Pilling Signed: 05/22/2019 5:40:01 PM By: Carlene Coria RN Entered By: Deon Pilling on 05/21/2019 11:27:34 -------------------------------------------------------------------------------- Wound Assessment Details Patient Name: Date of Service: Larry Marvel  L. 05/21/2019 10:15 AM Medical Record [id number] Patient Account Number: [id number] Date of Birth/Sex: Treating RN: [birth year] (68 y.o. Jerilynn Mages) Carlene Coria Primary Care Thurman Sarver: Lin Landsman Other Clinician: Referring Galit Urich: Treating Jazsmine Macari/Extender:Robson, Fernanda Drum, [name] in Treatment: 19 Wound Status Wound Number: 9 Primary Open Surgical Wound Etiology: Wound Location: Abdomen - midline - Distal Wound Open Wounding Event: Gradually Appeared Status: Date Acquired: 02/28/2019 Comorbid Asthma, Chronic Obstructive Pulmonary Weeks Of Treatment: 11 History: Disease (COPD), Sleep Apnea, Arrhythmia, Clustered Wound: No Congestive Heart Failure, Coronary Artery Disease, Hypertension, Peripheral Arterial Disease, Peripheral Venous Disease Photos Photo Uploaded By: Mikeal Hawthorne on 05/24/2019 10:14:02 Wound Measurements Length: (cm) 0.6 % Reduct Width: (cm) 0.7 % Reduct Depth: (cm) 0.1 Epitheli Area: (cm) 0.33 Tunneli Volume: (cm) 0.033 Undermi Wound Description Classification: Full Thickness Without Exposed Support Foul Odo Structures Slough/F Wound Distinct, outline attached Margin: Exudate Small Amount: Exudate Serosanguineous Type: Exudate red,  brown Color: Wound Bed Granulation Amount: Large (67-100%) Granulation Quality: Pink, Hyper-granulation Fascia E Necrotic Amount: None Present (0%) Fat Layer Tendon Exp Muscle Exp Joint Expo Bone Expos r After Cleansing: No ibrino No Exposed Structure xposed: No (Subcutaneous Tissue) Exposed: Yes osed: No osed: No sed: No ed: No ion in Area: 65% ion in Volume: 64.9% alization: Medium (34-66%) ng: No ning: No Treatment Notes Wound #9 (Distal Abdomen - midline) 1. Cleanse With Wound Cleanser 2. Periwound Care Skin Prep 3. Primary Dressing Applied Iodoflex 4. Secondary Dressing ABD Pad Dry Gauze 5. Secured With Recruitment consultant) Signed: 05/22/2019 5:08:19 PM By: Deon Pilling Signed: 05/22/2019 5:40:01 PM By: Carlene Coria RN Entered By: Deon Pilling on 05/21/2019 11:27:47 -------------------------------------------------------------------------------- Vitals Details Patient Name: Date of Service: Larry [name] 05/21/2019 10:15 AM Medical Record [id number] Patient Account Number: [id number] Date of Birth/Sex: Treating RN: Mar 27, 1950 (68 y.o. Jerilynn Mages) Carlene Coria Primary Care Loreli Debruler: Lin Landsman Other Clinician: Referring Andreas Sobolewski: Treating Ayvah Caroll/Extender:Robson, Fernanda Drum, [name] in Treatment: 19 Vital Signs Time Taken: 11:00 Temperature (F): 98.1 Height (in): 71 Pulse (bpm): 90 Weight (lbs): 344 Respiratory Rate (breaths/min): 20 Body Mass Index (BMI): 48 Blood Pressure (mmHg): 111/67 Reference Range: 80 - 120 mg / dl Electronic Signature(s) Signed: 06/05/2019 9:21:08 AM By: Sandre Kitty Entered By: Sandre Kitty on 05/21/2019 11:01:20

## 2019-06-18 ENCOUNTER — Encounter (HOSPITAL_BASED_OUTPATIENT_CLINIC_OR_DEPARTMENT_OTHER): Payer: Medicare Other | Attending: Internal Medicine | Admitting: Internal Medicine

## 2019-06-18 ENCOUNTER — Other Ambulatory Visit: Payer: Self-pay

## 2019-06-18 DIAGNOSIS — G4733 Obstructive sleep apnea (adult) (pediatric): Secondary | ICD-10-CM | POA: Insufficient documentation

## 2019-06-18 DIAGNOSIS — Z955 Presence of coronary angioplasty implant and graft: Secondary | ICD-10-CM | POA: Diagnosis not present

## 2019-06-18 DIAGNOSIS — Z7902 Long term (current) use of antithrombotics/antiplatelets: Secondary | ICD-10-CM | POA: Insufficient documentation

## 2019-06-18 DIAGNOSIS — J449 Chronic obstructive pulmonary disease, unspecified: Secondary | ICD-10-CM | POA: Insufficient documentation

## 2019-06-18 DIAGNOSIS — I739 Peripheral vascular disease, unspecified: Secondary | ICD-10-CM | POA: Insufficient documentation

## 2019-06-18 DIAGNOSIS — Z7982 Long term (current) use of aspirin: Secondary | ICD-10-CM | POA: Insufficient documentation

## 2019-06-18 DIAGNOSIS — Z6841 Body Mass Index (BMI) 40.0 and over, adult: Secondary | ICD-10-CM | POA: Insufficient documentation

## 2019-06-18 DIAGNOSIS — N183 Chronic kidney disease, stage 3 unspecified: Secondary | ICD-10-CM | POA: Diagnosis not present

## 2019-06-18 DIAGNOSIS — Z86711 Personal history of pulmonary embolism: Secondary | ICD-10-CM | POA: Diagnosis not present

## 2019-06-18 DIAGNOSIS — T8131XA Disruption of external operation (surgical) wound, not elsewhere classified, initial encounter: Secondary | ICD-10-CM | POA: Diagnosis not present

## 2019-06-18 DIAGNOSIS — I2510 Atherosclerotic heart disease of native coronary artery without angina pectoris: Secondary | ICD-10-CM | POA: Diagnosis not present

## 2019-06-18 DIAGNOSIS — Z09 Encounter for follow-up examination after completed treatment for conditions other than malignant neoplasm: Secondary | ICD-10-CM | POA: Diagnosis not present

## 2019-06-18 DIAGNOSIS — Y838 Other surgical procedures as the cause of abnormal reaction of the patient, or of later complication, without mention of misadventure at the time of the procedure: Secondary | ICD-10-CM | POA: Insufficient documentation

## 2019-06-18 DIAGNOSIS — Z933 Colostomy status: Secondary | ICD-10-CM | POA: Diagnosis not present

## 2019-06-18 DIAGNOSIS — Z85038 Personal history of other malignant neoplasm of large intestine: Secondary | ICD-10-CM | POA: Diagnosis not present

## 2019-06-18 DIAGNOSIS — I872 Venous insufficiency (chronic) (peripheral): Secondary | ICD-10-CM | POA: Diagnosis not present

## 2019-06-18 DIAGNOSIS — I5022 Chronic systolic (congestive) heart failure: Secondary | ICD-10-CM | POA: Insufficient documentation

## 2019-06-18 DIAGNOSIS — I130 Hypertensive heart and chronic kidney disease with heart failure and stage 1 through stage 4 chronic kidney disease, or unspecified chronic kidney disease: Secondary | ICD-10-CM | POA: Insufficient documentation

## 2019-06-18 DIAGNOSIS — T8132XA Disruption of internal operation (surgical) wound, not elsewhere classified, initial encounter: Secondary | ICD-10-CM | POA: Diagnosis present

## 2019-06-18 NOTE — Progress Notes (Signed)
Riggsbee, Willy L. ([id number]) Visit Report for 06/18/2019 HPI Details Patient Name: Date of Service: RONDLE, SEHGAL 06/18/2019 10:15 AM Medical Record [id number] Patient Account Number: [id number] Date of Birth/Sex: Treating RN: December 31, 1950 (69 y.o. Larry [name]) Carlene Coria Primary Care Provider: Lin Landsman Other Clinician: Referring Provider: Treating Provider/Extender:Samaira Holzworth, Fernanda Drum, [name] in Treatment: 23 History of Present Illness Location: Patient presents with a wound to left lower leg. Quality: Patient reports No Pain. Severity: Patient states wound(s) are getting worse. Duration: Patient has had the wound for < 2 weeks prior to presenting for treatment Modifying Factors: pvd HPI Description: pt smoked until 5 years ago. cancer surgery 1 year ago. has ruq stoma. cv studies last year revealed non-compressible vessels. recent pe by pcp yielded some heart abnormality and he is scheduled for stress test. 5/24 2 new wounds on rt leg. left side healed 08/12/14; several small wounds on the right lateral leg and one wound on the left leg which is new. The edema control here does not look adequate. There is also some degree of maceration around the wounds on the right lateral leg READMISSION 09/14/2018 This is a patient we had in this clinic in 2013 and then again in 2016 with wounds on his right lateral leg secondary to chronic venous insufficiency. He was cared for I believe in 2016 by Dr. Lindon Romp. The patient is here for review of 6 small wounds within a large surgical area on his abdomen. The patient had surgery for a malignant neoplasm of the sigmoid colon with a colostomy. I cannot see the date of the exact surgery in epic. In February 2018 he had an attempted colostomy reversal however he had a microperforation I believe at the anastomosis. He required a repeat laparotomy. He had an ileostomy raised at that time. I believe the surgical wound at that time was left  to close via secondary intention. The patient is unaware whether he had a wound VAC on this. In any case this almost is completely closed except he has been left with 6 small eraser head sized areas on the abdomen that have not healed. I am not exactly sure how he was how he is been dressing this. He has been followed with frequent visits in general surgery dating back at least over a year. Past medical history includes chronic systolic heart failure, stage III chronic renal failure. Malignant neoplasm of the sigmoid colon, morbid obesity. 7/31; not a lot of change 6 small wounds within a large surgical area on his abdomen. None of these are hyper granulated as opposed to last week. None of them have exposed mesh or surrounding infection. He has been using [facility] 8/14- He comes after 2 weeks, he has only 2 open wounds that I can tell this time, he has been using Hydrofera Blue 8/28; 2-week follow-up. Very superficial areas on the abdominal surgical site scar. He is using Hydrofera Blue and this seems to be doing a good job. He has not made it down to the medical supply store to see if he can get an abdominal binder or something to keep the tension off this wound area. READMISSION 01/04/2019 This is a 69 year old man we had in the clinic from July through August 2020. He had wounds in the midline abdominal scar. [Please see history from my note of 09/14/2018}. When he was here last time we used Hydrofera Blue. Things seem to be improving. He did not discharge in a healed state his last visit here was on  8/28. He states he continued to use the [facility] and at 1 point this was very close to healing but more recently it is reopened. The wounds are basically in the same state as last time superficial friable easily bleeding wounds. Some hyper granulation. He has an abdominal binder that he got at [facility] however he says it is too tight for him to get on. Past medical history is  reviewed; he has coronary artery disease, stage III chronic renal failure heart failure with reduced ejection fraction, COPD, hypertension, obstructive sleep apnea. He was hospitalized since he was here last time for a severe pharyngitis. 11/19; the patient was in the ER on 11/16; this was for oozing from his abdominal wound. He was cauterized with quick clot gauze and epi and silver nitrate. This was felt to be venous oozing. He has very friable wounds in this area. He is using his abdominal binder. 12/3; the patient was in the ER with bleeding from the lower part of his incision in the abdomen on 11/28. Had a stitch placed to contain the bleeding area. This was just underneath his major wound. He saw general surgery yesterday and apparently we are to remove the stitch in 2 weeks. The patient is on Plavix and aspirin for coronary artery disease with stents. His bleeding was felt to be capillary. 12/17; I removed the stitch from the bleeding area that was placed last time. This came out with some difficulty. The major area almost looks like slough with an area of cellophane over it. I did not attempt any further debridement 02/28/2019. The patient has new areas superiorly and inferiorly to the major wound in the middle. The area in the middle has a nonviable surface over perhaps 60 to 70% however debridement causes uncontrolled bleeding here. He is on Plavix. We have been usin hydrogel and Prisma. 1/21; the patient has 2 superficial areas superiorly he has the major wound in the middle and a small area inferiorly in the middle of this abdominal wound. We switch to Iodoflex last time 2/4; the patient has 3 open areas. 2 of these are on the right part of this surgical wound on his abdomen and one more on the left central. The 2 on the right are hyper granulated I used silver nitrate on these. The area on the left appears to have a fibrinous surface. I attempted to debride this once he ended up in the  ER. I am reluctant to do this again. We have been using Iodoflex 2/25; he has 3 open areas small ones proximally and distally and then the larger one in the middle. This has nonviable debris over fibrinous debris over the surface. He is using Iodoflex 3/30; I follow this man monthly. He has wounds in the middle of the surgical scar in his abdomen. Today 2 wounds which is an improvement. The major wound in the middle and one inferiorly and medially. 4/26; I follow this patient every month. This is a palliative setting. Wounds in the middle part of his surgical scar in the abdomen. He had significant amounts of denuded skin around the central area. This wipes off with Anasept and gauze. Really not any major improvement. We did get an abdominal binder for him at 1 point as usual patients do not wear these Electronic Signature(s) Signed: 06/18/2019 5:41:23 PM By: Linton Ham MD Entered By: Linton Ham on 06/18/2019 11:27:17 -------------------------------------------------------------------------------- Physical Exam Details Patient Name: Date of Service: [name] 06/18/2019 10:15 AM Medical  Record [id number] Patient Account Number: [id number] Date of Birth/Sex: Treating RN: 11/25/1950 (68 y.o. Larry [name]) Carlene Coria Primary Care Provider: Lin Landsman Other Clinician: Referring Provider: Treating Provider/Extender:Adhya Cocco, Fernanda Drum, [name] in Treatment: 23 Constitutional Sitting or standing Blood Pressure is within target range for patient.. Pulse regular and within target range for patient.[name] Kitchen Respirations regular, non-labored and within target range.. Temperature is normal and within the target range for the patient.[name] Kitchen Appears in no distress. Gastrointestinal (GI) Distended but not as much as I am used to seeing. His ostomy appears to be working well. There is no tenderness. Notes Wound exam; the areas in the mid part of the central abdomen. Ostomy bag is  working well. The original wound is in the middle part of the abdomen nonviable surface but profusely bleeds he has areas of denuded skin in this wound Electronic Signature(s) Signed: 06/18/2019 5:41:23 PM By: Linton Ham MD Entered By: Linton Ham on 06/18/2019 11:29:27 -------------------------------------------------------------------------------- Physician Orders Details Patient Name: Date of Service: JAVARIE, SHEARD 06/18/2019 10:15 AM Medical Record [id number] Patient Account Number: [id number] Date of Birth/Sex: Treating RN: [birth year] (68 y.o. Larry [name]) Carlene Coria Primary Care Provider: Lin Landsman Other Clinician: Referring Provider: Treating Provider/Extender:Leonora Gores, Fernanda Drum, [name] in Treatment: 57 Verbal / Phone Orders: No Diagnosis Coding ICD-10 Coding Code Description T81.31XD Disruption of external operation (surgical) wound, not elsewhere classified, subsequent encounter Unspecified open wound of abdominal wall, periumbilic region without penetration into peritoneal S31.105S cavity, sequela Follow-up Appointments Return appointment in 1 month. - earlier in the day. Dressing Change Frequency Change Dressing every other day. Wound Cleansing May shower and wash wound with soap and water. - with dressing change Primary Wound Dressing Wound #8 Abdomen - midline Calcium Alginate with Silver Wound #9 Distal Abdomen - midline Calcium Alginate with Silver Secondary Dressing Dry Gauze ABD pad Other: - abdominal binder while up during the day. may remove at night. Electronic Signature(s) Signed: 06/18/2019 5:28:23 PM By: Carlene Coria RN Signed: 06/18/2019 5:41:23 PM By: Linton Ham MD Entered By: Carlene Coria on 06/18/2019 11:14:38 -------------------------------------------------------------------------------- Problem List Details Patient Name: Date of Service: YASIN, CURATOLA 06/18/2019 10:15 AM Medical Record [id number]  Patient Account Number: [id number] Date of Birth/Sex: Treating RN: 06-04-1950 (68 y.o. Larry [name]) Carlene Coria Primary Care Provider: Lin Landsman Other Clinician: Referring Provider: Treating Provider/Extender:Elika Godar, Fernanda Drum, [name] in Treatment: 23 Active Problems ICD-10 Encounter Code Description Active Date MDM Diagnosis T81.31XD Disruption of external operation (surgical) wound, not 01/04/2019 No Yes elsewhere classified, subsequent encounter S31.105S Unspecified open wound of abdominal wall, periumbilic [id number] No Yes region without penetration into peritoneal cavity, sequela Inactive Problems Resolved Problems Electronic Signature(s) Signed: 06/18/2019 5:41:23 PM By: Linton Ham MD Entered By: Linton Ham on 06/18/2019 11:24:46 -------------------------------------------------------------------------------- Progress Note Details Patient Name: Date of Service: [name] 06/18/2019 10:15 AM Medical Record [id number] Patient Account Number: [id number] Date of Birth/Sex: Treating RN: [birth year] (68 y.o. Larry [name]) Carlene Coria Primary Care Provider: Lin Landsman Other Clinician: Referring Provider: Treating Provider/Extender:Jacquie Lukes, Fernanda Drum, [name] in Treatment: 23 Subjective History of Present Illness (HPI) The following HPI elements were documented for the patient's wound: Location: Patient presents with a wound to left lower leg. Quality: Patient reports No Pain. Severity: Patient states wound(s) are getting worse. Duration: Patient has had the wound for < 2 weeks prior to presenting for treatment Modifying Factors: pvd pt smoked until 5 years ago. cancer surgery 1 year ago. has ruq stoma. cv studies last year revealed non- compressible vessels. recent pe by  pcp yielded some heart abnormality and he is scheduled for stress test. 5/24 2 new wounds on rt leg. left side healed 08/12/14; several small wounds on the right lateral leg and  one wound on the left leg which is new. The edema control here does not look adequate. There is also some degree of maceration around the wounds on the right lateral leg READMISSION 09/14/2018 This is a patient we had in this clinic in 2013 and then again in 2016 with wounds on his right lateral leg secondary to chronic venous insufficiency. He was cared for I believe in 2016 by Dr. Lindon Romp. The patient is here for review of 6 small wounds within a large surgical area on his abdomen. The patient had surgery for a malignant neoplasm of the sigmoid colon with a colostomy. I cannot see the date of the exact surgery in epic. In February 2018 he had an attempted colostomy reversal however he had a microperforation I believe at the anastomosis. He required a repeat laparotomy. He had an ileostomy raised at that time. I believe the surgical wound at that time was left to close via secondary intention. The patient is unaware whether he had a wound VAC on this. In any case this almost is completely closed except he has been left with 6 small eraser head sized areas on the abdomen that have not healed. I am not exactly sure how he was how he is been dressing this. He has been followed with frequent visits in general surgery dating back at least over a year. Past medical history includes chronic systolic heart failure, stage III chronic renal failure. Malignant neoplasm of the sigmoid colon, morbid obesity. 7/31; not a lot of change 6 small wounds within a large surgical area on his abdomen. None of these are hyper granulated as opposed to last week. None of them have exposed mesh or surrounding infection. He has been using [facility] 8/14- He comes after 2 weeks, he has only 2 open wounds that I can tell this time, he has been using Hydrofera Blue 8/28; 2-week follow-up. Very superficial areas on the abdominal surgical site scar. He is using Hydrofera Blue and this seems to be doing a good job. He has  not made it down to the medical supply store to see if he can get an abdominal binder or something to keep the tension off this wound area. READMISSION 01/04/2019 This is a 69 year old man we had in the clinic from July through August 2020. He had wounds in the midline abdominal scar. [Please see history from my note of 09/14/2018}. When he was here last time we used Hydrofera Blue. Things seem to be improving. He did not discharge in a healed state his last visit here was on 8/28. He states he continued to use the [facility] and at 1 point this was very close to healing but more recently it is reopened. The wounds are basically in the same state as last time superficial friable easily bleeding wounds. Some hyper granulation. He has an abdominal binder that he got at [facility] however he says it is too tight for him to get on. Past medical history is reviewed; he has coronary artery disease, stage III chronic renal failure heart failure with reduced ejection fraction, COPD, hypertension, obstructive sleep apnea. He was hospitalized since he was here last time for a severe pharyngitis. 11/19; the patient was in the ER on 11/16; this was for oozing from his abdominal wound. He was  cauterized with quick clot gauze and epi and silver nitrate. This was felt to be venous oozing. He has very friable wounds in this area. He is using his abdominal binder. 12/3; the patient was in the ER with bleeding from the lower part of his incision in the abdomen on 11/28. Had a stitch placed to contain the bleeding area. This was just underneath his major wound. He saw general surgery yesterday and apparently we are to remove the stitch in 2 weeks. The patient is on Plavix and aspirin for coronary artery disease with stents. His bleeding was felt to be capillary. 12/17; I removed the stitch from the bleeding area that was placed last time. This came out with some difficulty. The major area almost looks like  slough with an area of cellophane over it. I did not attempt any further debridement 02/28/2019. The patient has new areas superiorly and inferiorly to the major wound in the middle. The area in the middle has a nonviable surface over perhaps 60 to 70% however debridement causes uncontrolled bleeding here. He is on Plavix. We have been usin hydrogel and Prisma. 1/21; the patient has 2 superficial areas superiorly he has the major wound in the middle and a small area inferiorly in the middle of this abdominal wound. We switch to Iodoflex last time 2/4; the patient has 3 open areas. 2 of these are on the right part of this surgical wound on his abdomen and one more on the left central. The 2 on the right are hyper granulated I used silver nitrate on these. The area on the left appears to have a fibrinous surface. I attempted to debride this once he ended up in the ER. I am reluctant to do this again. We have been using Iodoflex 2/25; he has 3 open areas small ones proximally and distally and then the larger one in the middle. This has nonviable debris over fibrinous debris over the surface. He is using Iodoflex 3/30; I follow this man monthly. He has wounds in the middle of the surgical scar in his abdomen. Today 2 wounds which is an improvement. The major wound in the middle and one inferiorly and medially. 4/26; I follow this patient every month. This is a palliative setting. Wounds in the middle part of his surgical scar in the abdomen. He had significant amounts of denuded skin around the central area. This wipes off with Anasept and gauze. Really not any major improvement. We did get an abdominal binder for him at 1 point as usual patients do not wear these Objective Constitutional Sitting or standing Blood Pressure is within target range for patient.. Pulse regular and within target range for patient.[name] Kitchen Respirations regular, non-labored and within target range.. Temperature is normal and within  the target range for the patient.[name] Kitchen Appears in no distress. Vitals Time Taken: 10:20 AM, Height: 71 in, Weight: 344 lbs, BMI: 48, Temperature: 98 F, Pulse: 90 bpm, Respiratory Rate: 18 breaths/min, Blood Pressure: 107/62 mmHg. Gastrointestinal (GI) Distended but not as much as I am used to seeing. His ostomy appears to be working well. There is no tenderness. General Notes: Wound exam; the areas in the mid part of the central abdomen. Ostomy bag is working well. The original wound is in the middle part of the abdomen nonviable surface but profusely bleeds he has areas of denuded skin in this wound Integumentary (Hair, Skin) Wound #10 status is Healed - Epithelialized. Original cause of wound was Gradually Appeared. The wound is located  on the Proximal Abdomen - midline. The wound measures 0cm length x 0cm width x 0cm depth; 0cm^2 area and 0cm^3 volume. Wound #8 status is Open. Original cause of wound was Surgical Injury. The wound is located on the Abdomen - midline. The wound measures 3.1cm length x 1.9cm width x 0.1cm depth; 4.626cm^2 area and 0.463cm^3 volume. There is Fat Layer (Subcutaneous Tissue) Exposed exposed. There is no tunneling or undermining noted. There is a medium amount of serosanguineous drainage noted. The wound margin is flat and intact. There is medium (34-66%) pink, pale granulation within the wound bed. There is a medium (34-66%) amount of necrotic tissue within the wound bed including Adherent Slough. Wound #9 status is Open. Original cause of wound was Gradually Appeared. The wound is located on the Distal Abdomen - midline. The wound measures 0.7cm length x 0.5cm width x 0.1cm depth; 0.275cm^2 area and 0.027cm^3 volume. There is Fat Layer (Subcutaneous Tissue) Exposed exposed. There is no tunneling or undermining noted. There is a small amount of serosanguineous drainage noted. The wound margin is distinct with the outline attached to the wound base. There is large  (67-100%) pink granulation within the wound bed. There is no necrotic tissue within the wound bed. Assessment Active Problems ICD-10 Disruption of external operation (surgical) wound, not elsewhere classified, subsequent encounter Unspecified open wound of abdominal wall, periumbilic region without penetration into peritoneal cavity, sequela Plan Follow-up Appointments: Return appointment in 1 month. - earlier in the day. Dressing Change Frequency: Change Dressing every other day. Wound Cleansing: May shower and wash wound with soap and water. - with dressing change Primary Wound Dressing: Wound #8 Abdomen - midline: Calcium Alginate with Silver Wound #9 Distal Abdomen - midline: Calcium Alginate with Silver Secondary Dressing: Dry Gauze ABD pad Other: - abdominal binder while up during the day. may remove at night. 1. I am going to change to silver alginate from Iodoflex 2. Truthfully I do not think anything is going to work on this area. This is the middle of scar tissue which is exceptionally fragile 3. He is not wearing his abdominal binder which actually is the one thing that I think might help Electronic Signature(s) Signed: 06/18/2019 5:41:23 PM By: Linton Ham MD Entered By: Linton Ham on 06/18/2019 11:30:13 -------------------------------------------------------------------------------- SuperBill Details Patient Name: Date of Service: [name] 06/18/2019 Medical Record [id number] Patient Account Number: [id number] Date of Birth/Sex: Treating RN: [birth year] (68 y.o. Larry [name]) Carlene Coria Primary Care Provider: Lin Landsman Other Clinician: Referring Provider: Treating Provider/Extender:Janesa Dockery, Fernanda Drum, [name] in Treatment: 23 Diagnosis Coding ICD-10 Codes Code Description T81.31XD Disruption of external operation (surgical) wound, not elsewhere classified, subsequent encounter Unspecified open wound of abdominal wall, periumbilic  region without penetration into peritoneal S31.105S cavity, sequela Facility Procedures The patient participates with Medicare or their insurance follows the Medicare Facility Guidelines: CPT4 Code Description Modifier Quantity [id number] [place] VISIT-LEV 3 EST PT 1 Physician Procedures CPT4: Description Modifier Quantity Code [id number] - WC PHYS LEVEL 2 - EST PT 1 ICD-10 Diagnosis Description T81.31XD Disruption of external operation (surgical) wound, not elsewhere classified, subsequent encounter S31.105S Unspecified open wound of  abdominal wall, periumbilic region without penetration into peritoneal cavity, sequela Electronic Signature(s) Signed: 06/18/2019 5:41:23 PM By: Linton Ham MD Entered By: Linton Ham on 06/18/2019 11:30:28

## 2019-06-18 NOTE — Progress Notes (Signed)
[name], [name] ([id number]) Visit Report for 06/18/2019 Arrival Information Details Patient Name: Date of Service: Larry, [name] 06/18/2019 10:15 AM Medical Record [id number] Patient Account Number: [id number] Date of Birth/Sex: Treating RN: Jun 01, 1950 (68 y.o. Larry [name]) Larry [name] Primary Care Larry [name]: Larry [name] Other Clinician: Referring Larry [name]: Treating Larry [name]/Extender:Larry [name], Larry [name], Larry [name] in Treatment: 23 Visit Information History Since Last Visit Added or deleted any medications: No Patient Arrived: Ambulatory Any new allergies or adverse reactions: No Arrival Time: 10:21 Had a fall or experienced change in No Accompanied By: self activities of daily living that may affect Transfer Assistance: None risk of falls: Patient Identification Verified: Yes Signs or symptoms of abuse/neglect since last visito No Secondary Verification Process Completed: Yes Hospitalized since last visit: No Patient Requires Transmission-Based No Implantable device outside of the clinic excluding No Precautions: cellular tissue based products placed in the center Patient Has Alerts: No since last visit: Has Dressing in Place as Prescribed: Yes Pain Present Now: No Electronic Signature(s) Signed: 06/18/2019 5:27:53 PM By: Larry [name] Entered By: Larry [name] on 06/18/2019 10:21:26 -------------------------------------------------------------------------------- Clinic Level of Care Assessment Details Patient Name: Date of Service: Larry [name] 06/18/2019 10:15 AM Medical Record [id number] Patient Account Number: [id number] Date of Birth/Sex: Treating RN: December 22, 1950 (68 y.o. Larry [name]) Larry [name] Primary Care Nandita Mathenia: Larry [name] Other Clinician: Referring Larry [name]: Treating Yak[place] Kreitzer/Extender:Larry [name], Larry [name], Larry [name] in Treatment: 23 Clinic Level of Care Assessment Items TOOL 4 Quantity Score X - Use when only an EandM is  performed on FOLLOW-UP visit 1 0 ASSESSMENTS - Nursing Assessment / Reassessment X - Reassessment of Co-morbidities (includes updates in patient status) 1 10 X - Reassessment of Adherence to Treatment Plan 1 5 ASSESSMENTS - Wound and Skin Assessment / Reassessment []  - Simple Wound Assessment / Reassessment - one wound 0 X - Complex Wound Assessment / Reassessment - multiple wounds 2 5 []  - Dermatologic / Skin Assessment (not related to wound area) 0 ASSESSMENTS - Focused Assessment []  - Circumferential Edema Measurements - multi extremities 0 []  - Nutritional Assessment / Counseling / Intervention 0 []  - Lower Extremity Assessment (monofilament, tuning fork, pulses) 0 []  - Peripheral Arterial Disease Assessment (using hand held doppler) 0 ASSESSMENTS - Ostomy and/or Continence Assessment and Care []  - Incontinence Assessment and Management 0 []  - Ostomy Care Assessment and Management (repouching, etc.) 0 PROCESS - Coordination of Care X - Simple Patient / Family Education for ongoing care 1 15 []  - Complex (extensive) Patient / Family Education for ongoing care 0 []  - Staff obtains Programmer, systems, Records, Test Results / Process Orders 0 []  - Staff telephones HHA, Nursing Homes / Clarify orders / etc 0 []  - Routine Transfer to another Facility (non-emergent condition) 0 []  - Routine Hospital Admission (non-emergent condition) 0 []  - New Admissions / Biomedical engineer / Ordering NPWT, Apligraf, etc. 0 []  - Emergency Hospital Admission (emergent condition) 0 X - Simple Discharge Coordination 1 10 []  - Complex (extensive) Discharge Coordination 0 PROCESS - Special Needs []  - Pediatric / Minor Patient Management 0 []  - Isolation Patient Management 0 []  - Hearing / Language / Visual special needs 0 []  - Assessment of Community assistance (transportation, D/C planning, etc.) 0 []  - Additional assistance / Altered mentation 0 []  - Support Surface(s) Assessment (bed, cushion, seat, etc.)  0 INTERVENTIONS - Wound Cleansing / Measurement []  - Simple Wound Cleansing - one wound 0 X - Complex Wound Cleansing - multiple wounds 2 5 X - Wound Imaging (photographs - any  number of wounds) 1 5 []  - Wound Tracing (instead of photographs) 0 []  - Simple Wound Measurement - one wound 0 X - Complex Wound Measurement - multiple wounds 2 5 INTERVENTIONS - Wound Dressings []  - Small Wound Dressing one or multiple wounds 0 X - Medium Wound Dressing one or multiple wounds 1 15 []  - Large Wound Dressing one or multiple wounds 0 []  - Application of Medications - topical 0 []  - Application of Medications - injection 0 INTERVENTIONS - Miscellaneous []  - External ear exam 0 []  - Specimen Collection (cultures, biopsies, blood, body fluids, etc.) 0 []  - Specimen(s) / Culture(s) sent or taken to Lab for analysis 0 []  - Patient Transfer (multiple staff / Civil Service fast streamer / Similar devices) 0 []  - Simple Staple / Suture removal (25 or less) 0 []  - Complex Staple / Suture removal (26 or more) 0 []  - Hypo / Hyperglycemic Management (close monitor of Blood Glucose) 0 []  - Ankle / Brachial Index (ABI) - do not check if billed separately 0 X - Vital Signs 1 5 Has the patient been seen at the hospital within the last three years: Yes Total Score: 95 Level Of Care: New/Established - Level 3 Electronic Signature(s) Signed: 06/18/2019 5:28:23 PM By: Larry Coria RN Entered By: Larry [name] on 06/18/2019 11:17:28 -------------------------------------------------------------------------------- Encounter Discharge Information Details Patient Name: Date of Service: Larry [name] 06/18/2019 10:15 AM Medical Record [id number] Patient Account Number: [id number] Date of Birth/Sex: Treating RN: [birth year] (69 y.o. Larry [name] Primary Care Larry [name]: Larry [name] Other Clinician: Referring Larry [name]: Treating Larry [name]/Extender:Larry [name], Larry [name], Larry [name] in Treatment: 23 Encounter  Discharge Information Items Discharge Condition: Stable Ambulatory Status: Ambulatory Discharge Destination: Home Transportation: Private Auto Accompanied By: self Schedule Follow-up Appointment: Yes Clinical Summary of Care: Patient Declined Electronic Signature(s) Signed: 06/18/2019 5:26:58 PM By: Kela Millin Entered By: Kela Millin on 06/18/2019 11:27:57 -------------------------------------------------------------------------------- Lower Extremity Assessment Details Patient Name: Date of Service: Larry, [name] 06/18/2019 10:15 AM Medical Record [id number] Patient Account Number: [id number] Date of Birth/Sex: Treating RN: Dec 31, 1950 (68 y.o. Larry [name]) Larry [name] Primary Care Tuwanda Vokes: Larry [name] Other Clinician: Referring Khamil Lamica: Treating Allyn Bertoni/Extender:Larry [name], Larry [name], Larry [name] in Treatment: 23 Electronic Signature(s) Signed: 06/18/2019 5:27:53 PM By: Larry [name] Signed: 06/18/2019 5:28:23 PM By: Larry Coria RN Entered By: Larry [name] on 06/18/2019 10:21:41 -------------------------------------------------------------------------------- Multi Wound Chart Details Patient Name: Date of Service: Larry [name] 06/18/2019 10:15 AM Medical Record [id number] Patient Account Number: [id number] Date of Birth/Sex: Treating RN: 27-Jan-1951 (68 y.o. Larry [name]) Larry [name] Primary Care Almond Fitzgibbon: Larry [name] Other Clinician: Referring Calyse Murcia: Treating Vendela Troung/Extender:Larry [name], Larry [name], Larry [name] in Treatment: 23 Vital Signs Height(in): 71 Pulse(bpm): 90 Weight(lbs): 344 Blood Pressure(mmHg):107/62 Body Mass Index(BMI): 48 Temperature(F): 98 Respiratory 18 Rate(breaths/min): Photos: [10:No Photos] [8:No Photos] [9:No Photos] Wound Location: [10:Proximal Abdomen - midline Abdomen - midline] [9:Distal Abdomen - midline] Wounding Event: [10:Gradually Appeared] [8:Surgical Injury] [9:Gradually Appeared] Primary Etiology:  [10:Open Surgical Wound] [8:Open Surgical Wound] [9:Open Surgical Wound] Comorbid History: [10:N/A] [8:Asthma, Chronic Obstructive Pulmonary Disease (COPD), Sleep Apnea, Arrhythmia, Congestive Heart Failure, Congestive Heart Failure, Coronary Artery Disease, Coronary Artery Disease, Hypertension, Peripheral Hypertension,  Peripheral Arterial Disease, Peripheral Arterial Disease, Peripheral Venous Disease] [9:Asthma, Chronic Obstructive Pulmonary Disease (COPD), Sleep Apnea, Arrhythmia, Venous Disease] Date Acquired: [10:02/28/2019] [8:02/21/2017] [9:02/28/2019] [name] of Treatment: [10:15] [8:23] [9:15] Wound Status: [10:Healed - Epithelialized] [8:Open] [9:Open] Clustered Wound: [10:Yes] [8:Yes] [9:No] Measurements L x W x D 0x0x0 [8:3.1x1.9x0.1] [9:0.7x0.5x0.1] (cm) Area (cm) : [10:0] [8:4.626] [9:0.275] Volume (cm) : [  10:0] [8:0.463] [9:0.027] % Reduction in Area: [10:100.00%] [8:94.20%] [9:70.80%] % Reduction in Volume: 100.00% [8:94.20%] [9:71.30%] Classification: [10:Full Thickness Without Exposed Support Structures Exposed Support Structures Exposed Support Structures] [8:Full Thickness Without] [9:Full Thickness Without] Exudate Amount: [10:N/A] [8:Medium] [9:Small] Exudate Type: [10:N/A] [8:Serosanguineous] [9:Serosanguineous] Exudate Color: [10:N/A] [8:red, brown] [9:red, brown] Wound Margin: [10:N/A] [8:Flat and Intact] [9:Distinct, outline attached] Granulation Amount: [10:N/A] [8:Medium (34-66%)] [9:Large (67-100%)] Granulation Quality: [10:N/A] [8:Pink, Pale] [9:Pink] Necrotic Amount: [10:N/A N/A] [8:Medium (34-66%) Small (1-33%)] [9:None Present (0%) Medium (34-66%)] Treatment Notes Electronic Signature(s) Signed: 06/18/2019 5:28:23 PM By: Larry Coria RN Signed: 06/18/2019 5:41:23 PM By: Linton Ham MD Entered By: Linton Ham on 06/18/2019 11:24:56 -------------------------------------------------------------------------------- [place] Details Patient  Name: Date of Service: Larry [name] 06/18/2019 10:15 AM Medical Record [id number] Patient Account Number: [id number] Date of Birth/Sex: Treating RN: [birth year] (68 y.o. Larry [name]) Larry [name] Primary Care Brexlee Heberlein: Larry [name] Other Clinician: Referring Anniston Nellums: Treating [place] Lapaglia/Extender:Larry [name], Larry [name], Larry [name] in Treatment: 23 Active Inactive Nutrition Nursing Diagnoses: Potential for alteratiion in Nutrition/Potential for imbalanced nutrition Goals: Patient/caregiver agrees to and verbalizes understanding of need to obtain nutritional consultation Date Initiated: 03/28/2019 Target Resolution Date: 07/05/2019 Goal Status: Active Interventions: Provide education on nutrition Treatment Activities: Education provided on Nutrition : 05/21/2019 Notes: Electronic Signature(s) Signed: 06/18/2019 5:28:23 PM By: Larry Coria RN Entered By: Larry [name] on 06/18/2019 10:56:27 -------------------------------------------------------------------------------- Pain Assessment Details Patient Name: Date of Service: Larry, [name] 06/18/2019 10:15 AM Medical Record [id number] Patient Account Number: [id number] Date of Birth/Sex: Treating RN: March 08, 1950 (68 y.o. Larry [name]) Larry [name] Primary Care Donda Friedli: Larry [name] Other Clinician: Referring Shaquanna Lycan: Treating Stpehen Petitjean/Extender:Larry [name], Larry [name], Larry [name] in Treatment: 23 Active Problems Location of Pain Severity and Description of Pain Patient Has Paino No Site Locations Rate the pain. Current Pain Level: 0 Pain Management and Medication Current Pain Management: Medication: No Cold Application: No Rest: No Massage: No Activity: No T.E.N.S.: No Heat Application: No Leg drop or elevation: No Is the Current Pain Management Adequate: Adequate How does your wound impact your activities of daily livingo Sleep: No Bathing: No Appetite: No Relationship With Others: No Bladder Continence:  No Emotions: No Bowel Continence: No Work: No Toileting: No Drive: No Dressing: No Hobbies: No Electronic Signature(s) Signed: 06/18/2019 5:27:53 PM By: Larry [name] Signed: 06/18/2019 5:28:23 PM By: Larry Coria RN Entered By: Larry [name] on 06/18/2019 10:21:36 -------------------------------------------------------------------------------- Patient/Caregiver Education Details Patient Name: Larry Marvel L. Date of Service: 4/27/2021andnbsp10:15 AM Medical Record [id number] Patient Account Number: [id number] Date of Birth/Gender: 06/16/1950 (69 y.o. M) Treating RN: Larry [name] Primary Care Physician: Larry [name] Other Clinician: Referring Physician: Treating Physician/Extender:Larry [name], Larry [name], Larry [name] in Treatment: 46 Education Assessment Education Provided To: Patient Education Topics Provided Nutrition: Methods: Explain/Verbal Responses: State content correctly Electronic Signature(s) Signed: 06/18/2019 5:28:23 PM By: Larry Coria RN Entered By: Larry [name] on 06/18/2019 10:56:43 -------------------------------------------------------------------------------- Wound Assessment Details Patient Name: Date of Service: Larry, [name] 06/18/2019 10:15 AM Medical Record [id number] Patient Account Number: [id number] Date of Birth/Sex: Treating RN: 07-27-1950 (68 y.o. Larry [name]) Larry [name] Primary Care Camran Keady: Larry [name] Other Clinician: Referring Yoshie Kosel: Treating Estalene Bergey/Extender:Larry [name], Larry [name], Larry [name] in Treatment: 23 Wound Status Wound Number: 10 Primary Etiology: Open Surgical Wound Wound Location: Proximal Abdomen - midline Wound Status: Healed - Epithelialized Wounding Event: Gradually Appeared Date Acquired: 02/28/2019 [name] Of Treatment:15 Clustered Wound: Yes Wound Measurements Length: (cm) 0 Width: (cm) 0 Depth: (cm) 0 Area: (cm) 0 Volume: (cm) 0 Wound Description Full Thickness Without Exposed  Support  Classification:Structures % Reduction in Area: 100% % Reduction in Volume: 100% Electronic Signature(s) Signed: 06/18/2019 5:27:53 PM By: Larry [name] Signed: 06/18/2019 5:28:23 PM By: Larry Coria RN Entered By: Larry [name] on 06/18/2019 10:26:16 -------------------------------------------------------------------------------- Wound Assessment Details Patient Name: Date of Service: Larry [name] 06/18/2019 10:15 AM Medical Record [id number] Patient Account Number: [id number] Date of Birth/Sex: Treating RN: January 08, 1951 (68 y.o. Larry [name]) Larry [name] Primary Care Flara Storti: Larry [name] Other Clinician: Referring Satrina Magallanes: Treating Jyrah Blye/Extender:Larry [name], Larry [name], Larry [name] in Treatment: 23 Wound Status Wound Number: 8 Primary Open Surgical Wound Wound Location: Abdomen - midline Etiology: Wounding Event: Surgical Injury Wound Open Date Acquired: 02/21/2017 Status: [name] Of Treatment:23 ComorbidAsthma, Chronic Obstructive Pulmonary Clustered Wound: Yes History: Disease (COPD), Sleep Apnea, Arrhythmia, Congestive Heart Failure, Coronary Artery Disease, Hypertension, Peripheral Arterial Disease, Peripheral Venous Disease Wound Measurements Length: (cm) 3.1 % Reducti Width: (cm) 1.9 % Reducti Depth: (cm) 0.1 Epitheli Area: (cm) 4.626 Tunneli Volume: (cm) 0.463 Undermi Wound Description Classification: Full Thickness Without Exposed Support Foul Odo Structures Slough/F Wound Flat and Intact Margin: Exudate Medium Amount: Exudate Type:Serosanguineous Exudate red, brown Color: Wound Bed Granulation Amount: Medium (34-66%) Granulation Quality: Pink, Pale Fascia E Necrotic Amount: Medium (34-66%) Fat Laye Necrotic Quality: Adherent Slough Tendon E Muscle E Joint Ex Bone Exp r After Cleansing: No ibrino No Exposed Structure xposed: No r (Subcutaneous Tissue) Exposed: Yes xposed: No xposed: No posed: No osed: No on in Area: 94.2% on in  Volume: 94.2% alization: Small (1-33%) ng: No ning: No Treatment Notes Wound #8 (Abdomen - midline) 1. Cleanse With Wound Cleanser 2. Periwound Care Skin Prep 3. Primary Dressing Applied Calcium Alginate Ag 4. Secondary Dressing ABD Pad Dry Gauze 5. Secured With Recruitment consultant) Signed: 06/18/2019 5:27:53 PM By: Larry [name] Signed: 06/18/2019 5:28:23 PM By: Larry Coria RN Entered By: Larry [name] on 06/18/2019 10:26:55 -------------------------------------------------------------------------------- Wound Assessment Details Patient Name: Date of Service: Larry, [name] 06/18/2019 10:15 AM Medical Record [id number] Patient Account Number: [id number] Date of Birth/Sex: Treating RN: 1950-10-21 (68 y.o. Larry [name]) Larry [name] Primary Care Leiby Pigeon: Larry [name] Other Clinician: Referring Byrne Capek: Treating Jerrico Covello/Extender:Larry [name], Larry [name], Larry [name] in Treatment: 23 Wound Status Wound Number: 9 Primary Open Surgical Wound Wound Location: Distal Abdomen - midline Etiology: Wounding Event: Gradually Appeared Wound Open Date Acquired: 02/28/2019 Status: [name] Of Treatment:15 ComorbidAsthma, Chronic Obstructive Pulmonary Clustered Wound: No History: Disease (COPD), Sleep Apnea, Arrhythmia, Congestive Heart Failure, Coronary Artery Disease, Hypertension, Peripheral Arterial Disease, Peripheral Venous Disease Wound Measurements Length: (cm) 0.7 % Reduct Width: (cm) 0.5 % Reduct Depth: (cm) 0.1 Epitheli Area: (cm) 0.275 Tunneli Volume: (cm) 0.027 Undermi Wound Description Full Thickness Without Exposed Support Foul Odo Classification: Structures Slough/F Wound Distinct, outline attached Margin: Exudate Exudate Small Amount: Exudate Type:Serosanguineous Exudate red, brown Color: Wound Bed Granulation Amount: Large (67-100%) Granulation Quality: Pink Fascia Exp Necrotic Amount: None Present (0%) Fat Layer Tendon Exp Muscle Exp Joint  Expo Bone Expos r After Cleansing: No ibrino No Exposed Structure osed: No (Subcutaneous Tissue) Exposed: Yes osed: No osed: No sed: No ed: No ion in Area: 70.8% ion in Volume: 71.3% alization: Medium (34-66%) ng: No ning: No Treatment Notes Wound #9 (Distal Abdomen - midline) 1. Cleanse With Wound Cleanser 2. Periwound Care Skin Prep 3. Primary Dressing Applied Calcium Alginate Ag 4. Secondary Dressing ABD Pad Dry Gauze 5. Secured With Recruitment consultant) Signed: 06/18/2019 5:27:53 PM By: Larry [name] Signed: 06/18/2019 5:28:23 PM By: Larry Coria RN Entered By: Larry [name] on 06/18/2019 10:27:54 --------------------------------------------------------------------------------  Vitals Details Patient Name: Date of Service: Larry, [name] 06/18/2019 10:15 AM Medical Record [id number] Patient Account Number: [id number] Date of Birth/Sex: Treating RN: December 23, 1950 (68 y.o. Larry [name]) Larry [name] Primary Care Stamatia Masri: Larry [name] Other Clinician: Referring Maritssa Haughton: Treating Bemnet Trovato/Extender:Larry [name], Larry [name], Larry [name] in Treatment: 23 Vital Signs Time Taken: 10:20 Temperature (F): 98 Height (in): 71 Pulse (bpm): 90 Weight (lbs): 344 Respiratory Rate (breaths/min): 18 Body Mass Index (BMI): 48 Blood Pressure (mmHg): 107/62 Reference Range: 80 - 120 mg / dl Electronic Signature(s) Signed: 06/18/2019 5:27:53 PM By: Larry [name] Entered By: Larry [name] on 06/18/2019 10:26:06

## 2019-06-20 ENCOUNTER — Other Ambulatory Visit: Payer: Self-pay

## 2019-06-20 ENCOUNTER — Ambulatory Visit (INDEPENDENT_AMBULATORY_CARE_PROVIDER_SITE_OTHER): Payer: Medicare Other | Admitting: Internal Medicine

## 2019-06-20 ENCOUNTER — Encounter: Payer: Self-pay | Admitting: Internal Medicine

## 2019-06-20 VITALS — BP 142/86 | Ht 71.0 in | Wt 353.0 lb

## 2019-06-20 DIAGNOSIS — I259 Chronic ischemic heart disease, unspecified: Secondary | ICD-10-CM | POA: Diagnosis not present

## 2019-06-20 DIAGNOSIS — Z933 Colostomy status: Secondary | ICD-10-CM | POA: Diagnosis not present

## 2019-06-20 DIAGNOSIS — G4733 Obstructive sleep apnea (adult) (pediatric): Secondary | ICD-10-CM | POA: Diagnosis not present

## 2019-06-20 DIAGNOSIS — Z9989 Dependence on other enabling machines and devices: Secondary | ICD-10-CM

## 2019-06-20 NOTE — Progress Notes (Signed)
HPI male former smoker followed for OSA complicated by COPD, ischemic cardiomyopathy, hypertension, history CHF, colon cancer, Obesity NPSG- 12/04/13- AHI 24.6/ hr, CPAP to 14, weight  348 lbs  ----------------------------------------------------------------------------  12/18/2015-69 year old male former smoker followed for OSA complicated by COPD, ischemic cardiomyopathy, hypertension, history CHF, colon cancer, CKD3,  CPAP auto 10-20/[facility] FOLLOWS FOR: DME [facility]. Pt wears CPAP about 7 hours nightly; pressure works well for patient; pt will need new supplies ordered. DL attached. Reports feeling well today. Comfortable with CPAP. Needs new headgear.  06/20/19- 69 year old male former smoker followed for OSA complicated by COPD, HTN, CAD, ischemic cardiomyopathy, dCHF, hypertension , AFib , colon cancer/ colostomy, Obesity CPAP auto 10-20/[facility] xopenex hfa, Download- unavailable. He reports using CPAP every night, working ok but this is his original machine. Body weight today- 353 lbs ------pt needs new cpap equip. uses equip every night.  Epworth score 3 Had 2 Phizer Covax He denies any current cardiopulmonary concerns at this visit. Still follows with cardiology.  ROS-see HPI Constitutional:   No-   weight loss, night sweats, fevers, chills, fatigue, lassitude. HEENT:   No-  headaches, difficulty swallowing, tooth/dental problems, sore throat,       No-  sneezing, itching, ear ache, nasal congestion, post nasal drip,  CV:  No-   chest pain, orthopnea, PND, swelling in lower extremities, anasarca,                                                     dizziness, palpitations Resp: +  shortness of breath with exertion or at rest.              No-   productive cough,  No non-productive cough,  No- coughing up of blood.              No-   change in color of mucus.  No- wheezing.   Skin: No-   rash or lesions. GI:  No-   heartburn, indigestion, abdominal pain, nausea, vomiting,  GU:  MS:   No-   joint pain or swelling.   Neuro-     nothing unusual Psych:  No- change in mood or affect. No depression or anxiety.  No memory loss.  OBJ- Physical Exam  big man, + obese General- Alert, Oriented, Affect-appropriate, Distress- none acute Skin- rash-none, lesions- none, excoriation- none Lymphadenopathy- none Head- atraumatic            Eyes- Gross vision intact, PERRLA, conjunctivae and secretions clear            Ears- Hearing, canals-normal            Nose- Clear, no-Septal dev, mucus, polyps, erosion, perforation             Throat- Mallampati III-IV , mucosa clear , drainage- none, tonsils- atrophic, + missing teeth/ poor hygiene, Neck- flexible , trachea midline, no stridor , thyroid nl, carotid no bruit Chest - symmetrical excursion , unlabored           Heart/CV- RRR , no murmur , no gallop  , no rub, nl s1 s2                           - JVD- none , edema- none, stasis changes- none, varices- none  Lung- clear to P&A, wheeze- none, cough- none , dullness-none, rub- none           Chest wall-  Abd- +right colostomy Br/ Gen/ Rectal- Not done, not indicated Extrem- cyanosis- none, clubbing, none, atrophy- none, strength- nl Neuro- grossly intact to observation

## 2019-06-20 NOTE — Patient Instructions (Signed)
Order- DME [facility]- please replace old CPAP machine, auto 10-20, mask of choice, humidifier, supplies, AirView/ card  Please call if we can help

## 2019-06-22 NOTE — Assessment & Plan Note (Signed)
Failed closure, so continues colostomy long-term. Apparently no recurrence of colon cancer.

## 2019-06-22 NOTE — Assessment & Plan Note (Signed)
With limited activity he is denying significant DOE or cough. We can help with this problem if needed.

## 2019-06-22 NOTE — Assessment & Plan Note (Signed)
Need to replace old machine then look at compliance and control. For now continue auto 10-20

## 2019-07-15 NOTE — Progress Notes (Signed)
Feng, Massai L. ([id number]) Visit Report for 04/18/2019 Arrival Information Details Patient Name: Date of Service: Larry Bishop MS, [facility] RD L. 04/18/2019 2:00 PM Medical Record Number: [id number] Patient Account Number: [id number] Date of Birth/Sex: Treating RN: [birth year] (69 y.o. Larry [name] Primary Care Quetzalli Clos: Lin Landsman Other Clinician: Referring Alie Moudy: Treating Jun Rightmyer/Extender: Shelly Rubenstein, [name] in Treatment: 14 Visit Information History Since Last Visit Added or deleted any medications: No Patient Arrived: Ambulatory Any new allergies or adverse reactions: No Arrival Time: 14:01 Had a fall or experienced change in No Accompanied By: self activities of daily living that may affect Transfer Assistance: None risk of falls: Patient Requires Transmission-Based Precautions: No Signs or symptoms of abuse/neglect since last visito No Patient Has Alerts: No Hospitalized since last visit: No Implantable device outside of the clinic excluding No cellular tissue based products placed in the center since last visit: Has Dressing in Place as Prescribed: Yes Pain Present Now: No Electronic Signature(s) Signed: 06/05/2019 9:23:26 AM By: Sandre Kitty Entered By: Sandre Kitty on 04/18/2019 14:03:13 -------------------------------------------------------------------------------- Clinic Level of Care Assessment Details Patient Name: Date of Service: [facility] MS, BENFEA RD L. 04/18/2019 2:00 PM Medical Record Number: [id number] Patient Account Number: [id number] Date of Birth/Sex: Treating RN: 01-Jun-1950 (70 y.o. Larry [name] Primary Care Ira Busbin: Lin Landsman Other Clinician: Referring Taro Hidrogo: Treating Keiley Levey/Extender: Shelly Rubenstein, [name] in Treatment: 14 Clinic Level of Care Assessment Items TOOL 4 Quantity Score X- 1 0 Use when only an EandM is performed on FOLLOW-UP visit ASSESSMENTS - Nursing Assessment /  Reassessment X- 1 10 Reassessment of Co-morbidities (includes updates in patient status) X- 1 5 Reassessment of Adherence to Treatment Plan ASSESSMENTS - Wound and Skin A ssessment / Reassessment []  - 0 Simple Wound Assessment / Reassessment - one wound X- 3 5 Complex Wound Assessment / Reassessment - multiple wounds X- 1 10 Dermatologic / Skin Assessment (not related to wound area) ASSESSMENTS - Focused Assessment []  - 0 Circumferential Edema Measurements - multi extremities X- 1 10 Nutritional Assessment / Counseling / Intervention []  - 0 Lower Extremity Assessment (monofilament, tuning fork, pulses) []  - 0 Peripheral Arterial Disease Assessment (using hand held doppler) ASSESSMENTS - Ostomy and/or Continence Assessment and Care []  - 0 Incontinence Assessment and Management []  - 0 Ostomy Care Assessment and Management (repouching, etc.) PROCESS - Coordination of Care []  - 0 Simple Patient / Family Education for ongoing care X- 1 20 Complex (extensive) Patient / Family Education for ongoing care X- 1 10 Staff obtains Programmer, systems, Records, T Results / Process Orders est []  - 0 Staff telephones HHA, Nursing Homes / Clarify orders / etc []  - 0 Routine Transfer to another Facility (non-emergent condition) []  - 0 Routine Hospital Admission (non-emergent condition) []  - 0 New Admissions / Biomedical engineer / Ordering NPWT Apligraf, etc. , []  - 0 Emergency Hospital Admission (emergent condition) []  - 0 Simple Discharge Coordination X- 1 15 Complex (extensive) Discharge Coordination PROCESS - Special Needs []  - 0 Pediatric / Minor Patient Management []  - 0 Isolation Patient Management []  - 0 Hearing / Language / Visual special needs []  - 0 Assessment of Community assistance (transportation, D/C planning, etc.) []  - 0 Additional assistance / Altered mentation []  - 0 Support Surface(s) Assessment (bed, cushion, seat, etc.) INTERVENTIONS - Wound Cleansing /  Measurement []  - 0 Simple Wound Cleansing - one wound X- 3 5 Complex Wound Cleansing - multiple wounds X- 1 5 Wound Imaging (photographs - any number of wounds) []  -  0 Wound Tracing (instead of photographs) []  - 0 Simple Wound Measurement - one wound X- 3 5 Complex Wound Measurement - multiple wounds INTERVENTIONS - Wound Dressings []  - 0 Small Wound Dressing one or multiple wounds []  - 0 Medium Wound Dressing one or multiple wounds X- 1 20 Large Wound Dressing one or multiple wounds []  - 0 Application of Medications - topical []  - 0 Application of Medications - injection INTERVENTIONS - Miscellaneous []  - 0 External ear exam []  - 0 Specimen Collection (cultures, biopsies, blood, body fluids, etc.) []  - 0 Specimen(s) / Culture(s) sent or taken to Lab for analysis []  - 0 Patient Transfer (multiple staff / Civil Service fast streamer / Similar devices) []  - 0 Simple Staple / Suture removal (25 or less) []  - 0 Complex Staple / Suture removal (26 or more) []  - 0 Hypo / Hyperglycemic Management (close monitor of Blood Glucose) []  - 0 Ankle / Brachial Index (ABI) - do not check if billed separately X- 1 5 Vital Signs Has the patient been seen at the hospital within the last three years: Yes Total Score: 155 Level Of Care: New/Established - Level 4 Electronic Signature(s) Signed: 04/18/2019 6:07:18 PM By: Deon Pilling Entered By: Deon Pilling on 04/18/2019 14:41:31 -------------------------------------------------------------------------------- Complex / Palliative Patient Assessment Details Patient Name: Date of Service: Larry Bishop MS, BENFEA RD L. 04/18/2019 2:00 PM Medical Record Number: [id number] Patient Account Number: [id number] Date of Birth/Sex: Treating RN: [birth year] (69 y.o. [name] Primary Care Draylon Mercadel: Lin Landsman Other Clinician: Referring Linah Klapper: Treating Soriah Leeman/Extender: Shelly Rubenstein, [name] in Treatment: 14 Palliative Management  Criteria Complex Wound Management Criteria Patient has remarkable or complex co-morbidities requiring medications or treatments that extend wound healing times. Examples: Diabetes mellitus with chronic renal failure or end stage renal disease requiring dialysis Advanced or poorly controlled rheumatoid arthritis Diabetes mellitus and end stage chronic obstructive pulmonary disease Active cancer with current chemo- or radiation therapy CHF, CAD, COPD, OSA, A-FIB, Non healing surgical wound Care Approach Wound Care Plan: Complex Wound Management Electronic Signature(s) Signed: 04/19/2019 5:26:28 PM By: Linton Ham MD Signed: 04/22/2019 5:57:14 PM By: Levan Hurst RN, BSN Entered By: Levan Hurst on 04/19/2019 11:45:33 -------------------------------------------------------------------------------- Encounter Discharge Information Details Patient Name: Date of Service: Larry Bishop MS, BENFEA RD L. 04/18/2019 2:00 PM Medical Record Number: [id number] Patient Account Number: [id number] Date of Birth/Sex: Treating RN: 1950-11-12 (69 y.o. [name] Primary Care Ronold Hardgrove: Lin Landsman Other Clinician: Referring Beth Goodlin: Treating Medardo Hassing/Extender: Lavella Lemons Weeks in Treatment: 14 Encounter Discharge Information Items Discharge Condition: Stable Ambulatory Status: Ambulatory Discharge Destination: Home Transportation: Private Auto Accompanied By: self Schedule Follow-up Appointment: Yes Clinical Summary of Care: Patient Declined Electronic Signature(s) Signed: 04/18/2019 5:28:31 PM By: Baruch Gouty RN, BSN Entered By: Baruch Gouty on 04/18/2019 14:56:17 -------------------------------------------------------------------------------- Lower Extremity Assessment Details Patient Name: Date of Service: Larry Bishop MS, BENFEA RD L. 04/18/2019 2:00 PM Medical Record Number: [id number] Patient Account Number: [id number] Date of Birth/Sex: Treating RN: 07-07-1950 (69  y.o. [name] Primary Care Jakyren Fluegge: Lin Landsman Other Clinician: Referring Ellissa Ayo: Treating Aristide Waggle/Extender: Shelly Rubenstein, [name] in Treatment: 14 Electronic Signature(s) Signed: 04/19/2019 5:17:01 PM By: Kela Millin Entered By: Kela Millin on 04/18/2019 14:20:05 -------------------------------------------------------------------------------- Multi Wound Chart Details Patient Name: Date of Service: Larry Bishop MS, BENFEA RD L. 04/18/2019 2:00 PM Medical Record Number: [id number] Patient Account Number: [id number] Date of Birth/Sex: Treating RN: 08-Jul-1950 (68 y.o. Larry [name] Primary Care Birdie Beveridge: Lin Landsman Other Clinician: Referring Tazia Illescas: Treating Geral Coker/Extender: Dellia Nims  Legrand Como REESE, [name] in Treatment: 14 Vital Signs Height(in): 71 Pulse(bpm): 101 Weight(lbs): 344 Blood Pressure(mmHg): 101/58 Body Mass Index(BMI): 48 Temperature(F): 98.2 Respiratory Rate(breaths/min): 20 Photos: [10:No Photos Abdomen - midline - Proximal] [8:No Photos Abdomen - midline] [9:No Photos Abdomen - midline - Distal] Wound Location: [10:Gradually Appeared] [8:Surgical Injury] [9:Gradually Appeared] Wounding Event: [10:Open Surgical Wound] [8:Open Surgical Wound] [9:Open Surgical Wound] Primary Etiology: [10:Asthma, Chronic Obstructive] [8:Asthma, Chronic Obstructive] [9:Asthma, Chronic Obstructive] Comorbid History: [10:Pulmonary Disease (COPD), Sleep Apnea, Arrhythmia, Congestive Heart Failure, Coronary Artery Disease, Hypertension, Peripheral Arterial Disease, Peripheral Venous Disease 02/28/2019] [8:Pulmonary Disease (COPD), Sleep Apnea,  Arrhythmia, Congestive Heart Failure, Coronary Artery Disease, Hypertension, Peripheral Arterial Disease, Peripheral Venous Disease 02/21/2017] [9:Pulmonary Disease (COPD), Sleep Apnea, Arrhythmia, Congestive Heart Failure, Coronary Artery Disease,  Hypertension, Peripheral Arterial Disease, Peripheral  Venous Disease 02/28/2019] Date Acquired: [10:7] [8:14] [9:7] Weeks of Treatment: [10:Open] [8:Open] [9:Open] Wound Status: [10:Yes] [8:Yes] [9:No] Clustered Wound: [10:1] [8:N/A] [9:N/A] Clustered Quantity: [10:0.3x0.3x0.1] [8:2.4x2x0.1] [9:0.1x0.1x0.1] Measurements L x W x D (cm) [10:0.071] [8:3.77] [9:0.008] A (cm) : rea [10:0.007] [8:0.377] [9:0.001] Volume (cm) : [10:24.50%] [8:95.30%] [9:99.20%] % Reduction in Area: [10:22.20%] [8:95.30%] [9:98.90%] % Reduction in Volume: [10:Full Thickness Without Exposed] [8:Full Thickness Without Exposed] [9:Full Thickness Without Exposed] Classification: [10:Support Structures Small] [8:Support Structures Medium] [9:Support Structures Small] Exudate Amount: [10:Serosanguineous] [8:Serosanguineous] [9:Serosanguineous] Exudate Type: [10:red, brown] [8:red, brown] [9:red, brown] Exudate Color: [10:Distinct, outline attached] [8:Flat and Intact] [9:Distinct, outline attached] Wound Margin: [10:Large (67-100%)] [8:Medium (34-66%)] [9:Large (67-100%)] Granulation Amount: [10:Pink, Hyper-granulation] [8:Pink] [9:Pink, Hyper-granulation] Granulation Quality: [10:None Present (0%)] [8:Medium (34-66%)] [9:None Present (0%)] Necrotic Amount: [10:Fat Layer (Subcutaneous Tissue)] [8:Fat Layer (Subcutaneous Tissue)] [9:Fat Layer (Subcutaneous Tissue)] Exposed Structures: [10:Exposed: Yes Fascia: No Tendon: No Muscle: No Joint: No Bone: No Medium (34-66%)] [8:Exposed: Yes Fascia: No Tendon: No Muscle: No Joint: No Bone: No Small (1-33%)] [9:Exposed: Yes Fascia: No Tendon: No Muscle: No Joint: No Bone: No Medium (34-66%)] Treatment Notes Wound #10 (Proximal Abdomen - midline) 3. Primary Dressing Applied Iodoflex 4. Secondary Dressing ABD Pad 5. Secured With Tape Wound #8 (Abdomen - midline) 3. Primary Dressing Applied Iodoflex 4. Secondary Dressing ABD Pad 5. Secured With Tape Wound #9 (Distal Abdomen - midline) 3. Primary Dressing  Applied Iodoflex 4. Secondary Dressing ABD Pad 5. Secured With Recruitment consultant) Signed: 04/18/2019 5:41:09 PM By: Linton Ham MD Signed: 04/18/2019 6:07:18 PM By: Deon Pilling Entered By: Linton Ham on 04/18/2019 14:58:03 -------------------------------------------------------------------------------- Multi-Disciplinary Care Plan Details Patient Name: Date of Service: Larry Bishop MS, BENFEA RD L. 04/18/2019 2:00 PM Medical Record Number: [id number] Patient Account Number: [id number] Date of Birth/Sex: Treating RN: [birth year] (69 y.o. Larry [name] Primary Care Jennilyn Esteve: Lin Landsman Other Clinician: Referring Imajean Mcdermid: Treating Lovada Barwick/Extender: Shelly Rubenstein, [name] in Treatment: 14 Active Inactive Nutrition Nursing Diagnoses: Potential for alteratiion in Nutrition/Potential for imbalanced nutrition Goals: Patient/caregiver agrees to and verbalizes understanding of need to obtain nutritional consultation Date Initiated: 03/28/2019 Target Resolution Date: 05/03/2019 Goal Status: Active Interventions: Provide education on nutrition Treatment Activities: Education provided on Nutrition : 03/28/2019 Notes: Electronic Signature(s) Signed: 04/18/2019 6:07:18 PM By: Deon Pilling Entered By: Deon Pilling on 04/18/2019 14:18:37 -------------------------------------------------------------------------------- Pain Assessment Details Patient Name: Date of Service: Larry Bishop MS, BENFEA RD L. 04/18/2019 2:00 PM Medical Record Number: [id number] Patient Account Number: [id number] Date of Birth/Sex: Treating RN: [birth year] (69 y.o. Larry [name] Primary Care Ahnya Akre: Lin Landsman Other Clinician: Referring Johnedward Brodrick: Treating Sarh Kirschenbaum/Extender: Shelly Rubenstein, [name] in Treatment: 14 Active Problems Location of Pain Severity  and Description of Pain Patient Has Paino No Site Locations Pain Management and Medication Current Pain  Management: Electronic Signature(s) Signed: 04/18/2019 6:07:18 PM By: Deon Pilling Signed: 06/05/2019 9:23:26 AM By: Sandre Kitty Entered By: Sandre Kitty on 04/18/2019 14:03:36 -------------------------------------------------------------------------------- Patient/Caregiver Education Details Patient Name: Date of Service: Gladys Damme, BENFEA RD L. 2/25/2021andnbsp2:00 PM Medical Record Number: [id number] Patient Account Number: [id number] Date of Birth/Gender: Treating RN: 12-01-1950 (69 y.o. Larry [name] Primary Care Physician: Lin Landsman Other Clinician: Referring Physician: Treating Physician/Extender: Nelida Meuse in Treatment: 14 Education Assessment Education Provided To: Patient Education Topics Provided Nutrition: Handouts: Nutrition Methods: Explain/Verbal Responses: Reinforcements needed Electronic Signature(s) Signed: 04/18/2019 6:07:18 PM By: Deon Pilling Entered By: Deon Pilling on 04/18/2019 14:18:47 -------------------------------------------------------------------------------- Wound Assessment Details Patient Name: Date of Service: Larry Bishop MS, BENFEA RD L. 04/18/2019 2:00 PM Medical Record Number: [id number] Patient Account Number: [id number] Date of Birth/Sex: Treating RN: 27-May-1950 (69 y.o. Larry [name] Primary Care Leyan Branden: Lin Landsman Other Clinician: Referring Breckin Zafar: Treating Arlesia Kiel/Extender: Shelly Rubenstein, [name] in Treatment: 14 Wound Status Wound Number: 10 Primary Open Surgical Wound Etiology: Wound Location: Abdomen - midline - Proximal Wound Open Wounding Event: Gradually Appeared Status: Date Acquired: 02/28/2019 Comorbid Asthma, Chronic Obstructive Pulmonary Disease (COPD), Sleep Weeks Of Treatment: 7 History: Apnea, Arrhythmia, Congestive Heart Failure, Coronary Artery Clustered Wound: Yes Disease, Hypertension, Peripheral Arterial Disease, Peripheral Venous  Disease Photos Wound Measurements Length: (cm) 0.3 Width: (cm) 0.3 Depth: (cm) 0.1 Clustered Quantity: 1 Area: (cm) 0.071 Volume: (cm) 0.007 % Reduction in Area: 24.5% % Reduction in Volume: 22.2% Epithelialization: Medium (34-66%) Tunneling: No Undermining: No Wound Description Classification: Full Thickness Without Exposed Support Structures Wound Margin: Distinct, outline attached Exudate Amount: Small Exudate Type: Serosanguineous Exudate Color: red, brown Foul Odor After Cleansing: No Slough/Fibrino No Wound Bed Granulation Amount: Large (67-100%) Exposed Structure Granulation Quality: Pink, Hyper-granulation Fascia Exposed: No Necrotic Amount: None Present (0%) Fat Layer (Subcutaneous Tissue) Exposed: Yes Tendon Exposed: No Muscle Exposed: No Joint Exposed: No Bone Exposed: No Electronic Signature(s) Signed: 04/29/2019 4:54:41 PM By: Mikeal Hawthorne EMT/HBOT Signed: 07/15/2019 1:32:21 PM By: Deon Pilling Previous Signature: 04/18/2019 6:07:18 PM Version By: Deon Pilling Previous Signature: 04/19/2019 5:17:01 PM Version By: Kela Millin Entered By: Mikeal Hawthorne on 04/29/2019 15:06:31 -------------------------------------------------------------------------------- Wound Assessment Details Patient Name: Date of Service: Larry Bishop MS, BENFEA RD L. 04/18/2019 2:00 PM Medical Record Number: [id number] Patient Account Number: [id number] Date of Birth/Sex: Treating RN: [birth year] (69 y.o. Larry [name] Primary Care Erhard Senske: Lin Landsman Other Clinician: Referring Kiaria Quinnell: Treating Thos Matsumoto/Extender: Shelly Rubenstein, [name] in Treatment: 14 Wound Status Wound Number: 8 Primary Open Surgical Wound Etiology: Wound Location: Abdomen - midline Wound Open Wounding Event: Surgical Injury Status: Date Acquired: 02/21/2017 Comorbid Asthma, Chronic Obstructive Pulmonary Disease (COPD), Sleep Weeks Of Treatment: 14 History: Apnea, Arrhythmia, Congestive  Heart Failure, Coronary Artery Clustered Wound: Yes Disease, Hypertension, Peripheral Arterial Disease, Peripheral Venous Disease Photos Wound Measurements Length: (cm) 2.4 Width: (cm) 2 Depth: (cm) 0.1 Area: (cm) 3.77 Volume: (cm) 0.377 % Reduction in Area: 95.3% % Reduction in Volume: 95.3% Epithelialization: Small (1-33%) Tunneling: No Undermining: No Wound Description Classification: Full Thickness Without Exposed Support Structures Wound Margin: Flat and Intact Exudate Amount: Medium Exudate Type: Serosanguineous Exudate Color: red, brown Foul Odor After Cleansing: No Slough/Fibrino No Wound Bed Granulation Amount: Medium (34-66%) Exposed Structure Granulation Quality: Pink Fascia Exposed: No Necrotic Amount: Medium (34-66%) Fat Layer (Subcutaneous Tissue) Exposed: Yes Tendon Exposed: No Muscle Exposed: No Joint  Exposed: No Bone Exposed: No Electronic Signature(s) Signed: 04/29/2019 4:54:41 PM By: Mikeal Hawthorne EMT/HBOT Signed: 07/15/2019 1:32:21 PM By: Deon Pilling Previous Signature: 04/18/2019 6:07:18 PM Version By: Deon Pilling Previous Signature: 04/19/2019 5:17:01 PM Version By: Kela Millin Entered By: Mikeal Hawthorne on 04/29/2019 15:07:09 -------------------------------------------------------------------------------- Wound Assessment Details Patient Name: Date of Service: Larry Bishop MS, BENFEA RD L. 04/18/2019 2:00 PM Medical Record Number: [id number] Patient Account Number: [id number] Date of Birth/Sex: Treating RN: 01/01/1951 (69 y.o. Larry [name] Primary Care Bilal Manzer: Lin Landsman Other Clinician: Referring Danial Sisley: Treating Daleiza Bacchi/Extender: Shelly Rubenstein, [name] in Treatment: 14 Wound Status Wound Number: 9 Primary Open Surgical Wound Etiology: Wound Location: Abdomen - midline - Distal Wound Open Wounding Event: Gradually Appeared Status: Date Acquired: 02/28/2019 Comorbid Asthma, Chronic Obstructive Pulmonary Disease  (COPD), Sleep Weeks Of Treatment: 7 History: Apnea, Arrhythmia, Congestive Heart Failure, Coronary Artery Clustered Wound: No Disease, Hypertension, Peripheral Arterial Disease, Peripheral Venous Disease Photos Wound Measurements Length: (cm) 0.1 Width: (cm) 0.1 Depth: (cm) 0.1 Area: (cm) 0.008 Volume: (cm) 0.001 % Reduction in Area: 99.2% % Reduction in Volume: 98.9% Epithelialization: Medium (34-66%) Tunneling: No Undermining: No Wound Description Classification: Full Thickness Without Exposed Support Structures Wound Margin: Distinct, outline attached Exudate Amount: Small Exudate Type: Serosanguineous Exudate Color: red, brown Foul Odor After Cleansing: No Slough/Fibrino No Wound Bed Granulation Amount: Large (67-100%) Exposed Structure Granulation Quality: Pink, Hyper-granulation Fascia Exposed: No Necrotic Amount: None Present (0%) Fat Layer (Subcutaneous Tissue) Exposed: Yes Tendon Exposed: No Muscle Exposed: No Joint Exposed: No Bone Exposed: No Electronic Signature(s) Signed: 04/29/2019 4:54:41 PM By: Mikeal Hawthorne EMT/HBOT Signed: 07/15/2019 1:32:21 PM By: Deon Pilling Previous Signature: 04/18/2019 6:07:18 PM Version By: Deon Pilling Previous Signature: 04/19/2019 5:17:01 PM Version By: Kela Millin Entered By: Mikeal Hawthorne on 04/29/2019 15:06:11 -------------------------------------------------------------------------------- Vitals Details Patient Name: Date of Service: Larry Bishop MS, BENFEA RD L. 04/18/2019 2:00 PM Medical Record Number: [id number] Patient Account Number: [id number] Date of Birth/Sex: Treating RN: 12-04-1950 (69 y.o. Larry [name] Primary Care Cherrell Maybee: Lin Landsman Other Clinician: Referring Monica Zahler: Treating Maxyne Derocher/Extender: Shelly Rubenstein, [name] in Treatment: 14 Vital Signs Time Taken: 14:03 Temperature (F): 98.2 Height (in): 71 Pulse (bpm): 101 Weight (lbs): 344 Respiratory Rate (breaths/min):  20 Body Mass Index (BMI): 48 Blood Pressure (mmHg): 101/58 Reference Range: 80 - 120 mg / dl Electronic Signature(s) Signed: 06/05/2019 9:23:26 AM By: Sandre Kitty Entered By: Sandre Kitty on 04/18/2019 14:03:29

## 2019-07-16 ENCOUNTER — Encounter (HOSPITAL_BASED_OUTPATIENT_CLINIC_OR_DEPARTMENT_OTHER): Payer: Medicare Other | Attending: Internal Medicine | Admitting: Internal Medicine

## 2019-07-16 DIAGNOSIS — Z955 Presence of coronary angioplasty implant and graft: Secondary | ICD-10-CM | POA: Diagnosis not present

## 2019-07-16 DIAGNOSIS — T8131XD Disruption of external operation (surgical) wound, not elsewhere classified, subsequent encounter: Secondary | ICD-10-CM | POA: Diagnosis not present

## 2019-07-16 DIAGNOSIS — I2510 Atherosclerotic heart disease of native coronary artery without angina pectoris: Secondary | ICD-10-CM | POA: Insufficient documentation

## 2019-07-16 DIAGNOSIS — N183 Chronic kidney disease, stage 3 unspecified: Secondary | ICD-10-CM | POA: Diagnosis not present

## 2019-07-16 DIAGNOSIS — Z85038 Personal history of other malignant neoplasm of large intestine: Secondary | ICD-10-CM | POA: Diagnosis not present

## 2019-07-16 DIAGNOSIS — I5022 Chronic systolic (congestive) heart failure: Secondary | ICD-10-CM | POA: Diagnosis not present

## 2019-07-16 DIAGNOSIS — Z87891 Personal history of nicotine dependence: Secondary | ICD-10-CM | POA: Diagnosis not present

## 2019-07-16 DIAGNOSIS — Z6841 Body Mass Index (BMI) 40.0 and over, adult: Secondary | ICD-10-CM | POA: Diagnosis not present

## 2019-07-16 DIAGNOSIS — I130 Hypertensive heart and chronic kidney disease with heart failure and stage 1 through stage 4 chronic kidney disease, or unspecified chronic kidney disease: Secondary | ICD-10-CM | POA: Diagnosis not present

## 2019-07-16 DIAGNOSIS — I739 Peripheral vascular disease, unspecified: Secondary | ICD-10-CM | POA: Diagnosis not present

## 2019-07-16 DIAGNOSIS — Y833 Surgical operation with formation of external stoma as the cause of abnormal reaction of the patient, or of later complication, without mention of misadventure at the time of the procedure: Secondary | ICD-10-CM | POA: Insufficient documentation

## 2019-07-16 DIAGNOSIS — Z933 Colostomy status: Secondary | ICD-10-CM | POA: Insufficient documentation

## 2019-07-16 DIAGNOSIS — S31105A Unspecified open wound of abdominal wall, periumbilic region without penetration into peritoneal cavity, initial encounter: Secondary | ICD-10-CM | POA: Diagnosis not present

## 2019-07-16 DIAGNOSIS — T8131XA Disruption of external operation (surgical) wound, not elsewhere classified, initial encounter: Secondary | ICD-10-CM | POA: Diagnosis not present

## 2019-07-16 NOTE — Progress Notes (Signed)
Faxon, [name] ([id number]) Visit Report for 07/16/2019 Arrival Information Details Patient Name: Date of Service: Larry Bishop [name], [facility] RD L. 07/16/2019 10:15 A M Medical Record Number: [id number] Patient Account Number: [id number] Date of Birth/Sex: Treating RN: 1950-11-05 (69 y.o. Larry [name] Primary Care Larry [name]: Larry [name] Other Clinician: Referring Larry [name]: Treating Larry [name]/Extender: Larry [name], Larry [name] Larry [name]: 26 Visit Information History Since Last Visit Added or deleted any medications: No Patient Arrived: Ambulatory Any new allergies or adverse reactions: No Arrival Time: 10:27 Had a fall or experienced change in No Accompanied By: self activities of daily living that may affect Transfer Assistance: None risk of falls: Patient Identification Verified: Yes Signs or symptoms of abuse/neglect since last visito No Secondary Verification Process Completed: Yes Hospitalized since last visit: No Patient Requires Transmission-Based Precautions: No Implantable device outside of the clinic excluding No Patient Has Alerts: No cellular tissue based products placed in the center since last visit: Has Dressing in Place as Prescribed: Yes Pain Present Now: No Electronic Signature(s) Signed: 07/16/2019 5:28:01 PM By: Baruch Gouty RN, BSN Entered By: Baruch Gouty on 07/16/2019 10:28:09 -------------------------------------------------------------------------------- Clinic Level of Care Assessment Details Patient Name: Date of Service: Larry [name], Larry RD L. 07/16/2019 10:15 A M Medical Record Number: [id number] Patient Account Number: [id number] Date of Birth/Sex: Treating RN: 1950/07/15 (68 y.o. Larry [name]) Carlene Coria Primary Care Kierrah Kilbride: Larry [name] Other Clinician: Referring Keziyah Kneale: Treating Renald Haithcock/Extender: Larry [name], Larry [name] Larry [name]: 29 Clinic Level of Care Assessment Items TOOL 4 Quantity Score X- 1 0 Use when  only an EandM is performed on FOLLOW-UP visit ASSESSMENTS - Nursing Assessment / Reassessment X- 1 10 Reassessment of Co-morbidities (includes updates in patient status) X- 1 5 Reassessment of Adherence to [name] Plan ASSESSMENTS - Wound and Skin A ssessment / Reassessment []  - 0 Simple Wound Assessment / Reassessment - one wound X- 2 5 Complex Wound Assessment / Reassessment - multiple wounds []  - 0 Dermatologic / Skin Assessment (not related to wound area) ASSESSMENTS - Focused Assessment []  - 0 Circumferential Edema Measurements - multi extremities []  - 0 Nutritional Assessment / Counseling / Intervention []  - 0 Lower Extremity Assessment (monofilament, tuning fork, pulses) []  - 0 Peripheral Arterial Disease Assessment (using hand held doppler) ASSESSMENTS - Ostomy and/or Continence Assessment and Care []  - 0 Incontinence Assessment and Management []  - 0 Ostomy Care Assessment and Management (repouching, etc.) PROCESS - Coordination of Care []  - 0 Simple Patient / Family Education for ongoing care []  - 0 Complex (extensive) Patient / Family Education for ongoing care X- 1 10 Staff obtains Programmer, systems, Records, T Results / Process Orders est []  - 0 Staff telephones HHA, Nursing Homes / Clarify orders / etc []  - 0 Routine Transfer to another Facility (non-emergent condition) []  - 0 Routine Hospital Admission (non-emergent condition) []  - 0 New Admissions / Biomedical engineer / Ordering NPWT Apligraf, etc. , []  - 0 Emergency Hospital Admission (emergent condition) X- 1 10 Simple Discharge Coordination []  - 0 Complex (extensive) Discharge Coordination PROCESS - Special Needs []  - 0 Pediatric / Minor Patient Management []  - 0 Isolation Patient Management []  - 0 Hearing / Language / Visual special needs []  - 0 Assessment of Community assistance (transportation, D/C planning, etc.) []  - 0 Additional assistance / Altered mentation []  - 0 Support  Surface(s) Assessment (bed, cushion, seat, etc.) INTERVENTIONS - Wound Cleansing / Measurement []  - 0 Simple Wound Cleansing - one wound X- 2 5 Complex Wound Cleansing - multiple  wounds X- 1 5 Wound Imaging (photographs - any number of wounds) []  - 0 Wound Tracing (instead of photographs) []  - 0 Simple Wound Measurement - one wound X- 2 5 Complex Wound Measurement - multiple wounds INTERVENTIONS - Wound Dressings []  - 0 Small Wound Dressing one or multiple wounds []  - 0 Medium Wound Dressing one or multiple wounds X- 1 20 Large Wound Dressing one or multiple wounds X- 1 5 Application of Medications - topical []  - 0 Application of Medications - injection INTERVENTIONS - Miscellaneous []  - 0 External ear exam []  - 0 Specimen Collection (cultures, biopsies, blood, body fluids, etc.) []  - 0 Specimen(s) / Culture(s) sent or taken to Lab for analysis []  - 0 Patient Transfer (multiple staff / Civil Service fast streamer / Similar devices) []  - 0 Simple Staple / Suture removal (25 or less) []  - 0 Complex Staple / Suture removal (26 or more) []  - 0 Hypo / Hyperglycemic Management (close monitor of Blood Glucose) []  - 0 Ankle / Brachial Index (ABI) - do not check if billed separately X- 1 5 Vital Signs Has the patient been seen at the hospital within the last three years: Yes Total Score: 100 Level Of Care: New/Established - Level 3 Electronic Signature(s) Signed: 07/16/2019 5:17:39 PM By: Carlene Coria RN Entered By: Carlene Coria on 07/16/2019 11:16:29 -------------------------------------------------------------------------------- Encounter Discharge Information Details Patient Name: Date of Service: Larry Bishop [name], BENFEA RD L. 07/16/2019 10:15 A M Medical Record Number: [id number] Patient Account Number: [id number] Date of Birth/Sex: Treating RN: 1950/10/07 (69 y.o. [name] Primary Care Dorthula Bier: Larry [name] Other Clinician: Referring Tiera Mensinger: Treating Gresia Isidoro/Extender:  Larry [name], Larry [name] Larry [name]: 52 Encounter Discharge Information Items Discharge Condition: Stable Ambulatory Status: Ambulatory Discharge Destination: Home Transportation: Private Auto Accompanied By: self Schedule Follow-up Appointment: Yes Clinical Summary of Care: Patient Declined Electronic Signature(s) Signed: 07/16/2019 5:12:39 PM By: Kela Millin Entered By: Kela Millin on 07/16/2019 11:25:11 -------------------------------------------------------------------------------- Lower Extremity Assessment Details Patient Name: Date of Service: Larry Bishop [name], [facility] RD L. 07/16/2019 10:15 A M Medical Record Number: [id number] Patient Account Number: [id number] Date of Birth/Sex: Treating RN: [birth year] (69 y.o. Larry [name] Primary Care Jaeanna Mccomber: Larry [name] Other Clinician: Referring Mizraim Harmening: Treating Bannon Giammarco/Extender: Larry [name], Larry [name] Larry [name]: 29 Electronic Signature(s) Signed: 07/16/2019 5:28:01 PM By: Baruch Gouty RN, BSN Entered By: Baruch Gouty on 07/16/2019 10:33:44 -------------------------------------------------------------------------------- [place] Details Patient Name: Date of Service: Larry Bishop [name], [place] RD L. 07/16/2019 10:15 A M Medical Record Number: [id number] Patient Account Number: [id number] Date of Birth/Sex: Treating RN: 1950/04/02 (68 y.o. Larry [name]) Carlene Coria Primary Care Bralin Garry: Larry [name] Other Clinician: Referring Jerrel Tiberio: Treating Ardena Gangl/Extender: Larry [name], Larry [name] Larry [name]: 29 Active Inactive Nutrition Nursing Diagnoses: Potential for alteratiion in Nutrition/Potential for imbalanced nutrition Goals: Patient/caregiver agrees to and verbalizes understanding of need to obtain nutritional consultation Date Initiated: 03/28/2019 T arget Resolution Date: 08/05/2019 Goal Status: Active Interventions: Provide education on nutrition [name]  Activities: Education provided on Nutrition : 05/21/2019 Notes: Electronic Signature(s) Signed: 07/16/2019 5:17:39 PM By: Carlene Coria RN Entered By: Carlene Coria on 07/16/2019 10:36:57 -------------------------------------------------------------------------------- Pain Assessment Details Patient Name: Date of Service: Larry Bishop [name], BENFEA RD L. 07/16/2019 10:15 A M Medical Record Number: [id number] Patient Account Number: [id number] Date of Birth/Sex: Treating RN: August 05, 1950 (69 y.o. Larry [name] Primary Care Maleyah Evans: Larry [name] Other Clinician: Referring Timmya Blazier: Treating Candise Crabtree/Extender: Larry [name], Larry [name] Larry [name]: 27 Active Problems Location of Pain Severity and Description of Pain Patient  Has Paino No Site Locations Rate the pain. Current Pain Level: 0 Pain Management and Medication Current Pain Management: Electronic Signature(s) Signed: 07/16/2019 5:28:01 PM By: Baruch Gouty RN, BSN Entered By: Baruch Gouty on 07/16/2019 10:33:35 -------------------------------------------------------------------------------- Patient/Caregiver Education Details Patient Name: Date of Service: Larry [name], [place] L. 5/25/2021andnbsp10:15 [place] Record Number: [id number] Patient Account Number: [id number] Date of Birth/Gender: Treating RN: [birth year] (68 y.o. Larry [name]) Carlene Coria Primary Care Physician: Larry [name] Other Clinician: Referring Physician: Treating Physician/Extender: Larry [name], Larry [name] Larry [name]: 66 Education Assessment Education Provided To: Patient Education Topics Provided Nutrition: Methods: Explain/Verbal Responses: State content correctly Electronic Signature(s) Signed: 07/16/2019 5:17:39 PM By: Carlene Coria RN Entered By: Carlene Coria on 07/16/2019 10:52:31 -------------------------------------------------------------------------------- Wound Assessment Details Patient Name: Date of Service: Larry Bishop  [name], BENFEA RD L. 07/16/2019 10:15 A M Medical Record Number: [id number] Patient Account Number: [id number] Date of Birth/Sex: Treating RN: 1950/11/02 (69 y.o. Larry [name] Primary Care Allysa Governale: Larry [name] Other Clinician: Referring Avry Roedl: Treating Zonnie Landen/Extender: Larry [name], Larry [name] Larry [name]: 27 Wound Status Wound Number: 8 Primary Open Surgical Wound Etiology: Wound Location: Abdomen - midline Wound Open Wounding Event: Surgical Injury Status: Date Acquired: 02/21/2017 Comorbid Asthma, Chronic Obstructive Pulmonary Disease (COPD), Sleep Larry Of [name]: 29 History: Apnea, Arrhythmia, Congestive Heart Failure, Coronary Artery Clustered Wound: Yes Disease, Hypertension, Peripheral Arterial Disease, Peripheral Venous Disease Wound Measurements Length: (cm) 3 Width: (cm) 3 Depth: (cm) 0.1 Area: (cm) 7.069 Volume: (cm) 0.707 % Reduction in Area: 91.1% % Reduction in Volume: 91.1% Epithelialization: Medium (34-66%) Tunneling: No Undermining: No Wound Description Classification: Full Thickness Without Exposed Support Structures Wound Margin: Flat and Intact Exudate Amount: Medium Exudate Type: Serous Exudate Color: amber Foul Odor After Cleansing: No Slough/Fibrino No Wound Bed Granulation Amount: Large (67-100%) Exposed Structure Granulation Quality: Red, Pink Fascia Exposed: No Necrotic Amount: Small (1-33%) Fat Layer (Subcutaneous Tissue) Exposed: Yes Necrotic Quality: Adherent Slough Tendon Exposed: No Muscle Exposed: No Joint Exposed: No Bone Exposed: No [name] Notes Wound #8 (Abdomen - midline) 1. Cleanse With Wound Cleanser 2. Periwound Care Skin Prep 3. Primary Dressing Applied Calcium Alginate Ag 4. Secondary Dressing ABD Pad Dry Gauze 5. Secured With Recruitment consultant) Signed: 07/16/2019 5:28:01 PM By: Baruch Gouty RN, BSN Entered By: Baruch Gouty on 07/16/2019  10:39:42 -------------------------------------------------------------------------------- Wound Assessment Details Patient Name: Date of Service: Larry Bishop [name], BENFEA RD L. 07/16/2019 10:15 A M Medical Record Number: [id number] Patient Account Number: [id number] Date of Birth/Sex: Treating RN: 1950-10-10 (69 y.o. Larry [name] Primary Care Kassi Esteve: Larry [name] Other Clinician: Referring Caliana Spires: Treating Odessia Asleson/Extender: Larry [name], Larry [name] Larry [name]: 27 Wound Status Wound Number: 9 Primary Open Surgical Wound Etiology: Wound Location: Distal Abdomen - midline Wound Open Wounding Event: Gradually Appeared Status: Date Acquired: 02/28/2019 Comorbid Asthma, Chronic Obstructive Pulmonary Disease (COPD), Sleep Larry Of [name]: 21 History: Apnea, Arrhythmia, Congestive Heart Failure, Coronary Artery Clustered Wound: No Disease, Hypertension, Peripheral Arterial Disease, Peripheral Venous Disease Wound Measurements Length: (cm) 1.3 Width: (cm) 1.8 Depth: (cm) 0.1 Area: (cm) 1.838 Volume: (cm) 0.184 % Reduction in Area: -95.1% % Reduction in Volume: -95.7% Epithelialization: Small (1-33%) Tunneling: No Undermining: No Wound Description Classification: Full Thickness Without Exposed Support Structures Wound Margin: Flat and Intact Exudate Amount: Medium Exudate Type: Serous Exudate Color: amber Foul Odor After Cleansing: No Slough/Fibrino No Wound Bed Granulation Amount: Large (67-100%) Exposed Structure Granulation Quality: Pink Fascia Exposed: No Necrotic Amount: None Present (0%) Fat Layer (Subcutaneous Tissue) Exposed: Yes Tendon  Exposed: No Muscle Exposed: No Joint Exposed: No Bone Exposed: No [name] Notes Wound #9 (Distal Abdomen - midline) 1. Cleanse With Wound Cleanser 2. Periwound Care Skin Prep 3. Primary Dressing Applied Calcium Alginate Ag 4. Secondary Dressing ABD Pad Dry Gauze 5. Secured With Architect) Signed: 07/16/2019 5:28:01 PM By: Baruch Gouty RN, BSN Entered By: Baruch Gouty on 07/16/2019 10:40:06 -------------------------------------------------------------------------------- Vitals Details Patient Name: Date of Service: Larry Bishop [name], BENFEA RD L. 07/16/2019 10:15 A M Medical Record Number: [id number] Patient Account Number: [id number] Date of Birth/Sex: Treating RN: 10/17/1950 (69 y.o. Larry [name] Primary Care Milledge Gerding: Larry [name] Other Clinician: Referring Creig Landin: Treating Ihsan Nomura/Extender: Larry [name], Larry [name] Larry [name]: 29 Vital Signs Time Taken: 10:28 Temperature (F): 98.3 Height (in): 71 Pulse (bpm): 95 Source: Stated Respiratory Rate (breaths/min): 18 Weight (lbs): 344 Blood Pressure (mmHg): 125/81 Source: Stated Reference Range: 80 - 120 mg / dl Body Mass Index (BMI): 48 Electronic Signature(s) Signed: 07/16/2019 5:28:01 PM By: Baruch Gouty RN, BSN Entered By: Baruch Gouty on 07/16/2019 10:33:27

## 2019-07-18 NOTE — Progress Notes (Signed)
Kenney, Jodey L. ([id number]) Visit Report for 07/16/2019 HPI Details Patient Name: Date of Service: Larry Bishop MS, [facility] RD L. 07/16/2019 10:15 A M Medical Record Number: [id number] Patient Account Number: [id number] Date of Birth/Sex: Treating RN: 11-21-1950 (69 y.o. Jerilynn Mages) Carlene Coria Primary Care Provider: Lin Landsman Other Clinician: Referring Provider: Treating Provider/Extender: Tracie Harrier, [name] in Treatment: 31 History of Present Illness Location: Patient presents with a wound to left lower leg. Quality: Patient reports No Pain. Severity: Patient states wound(s) are getting worse. Duration: Patient has had the wound for < 2 weeks prior to presenting for treatment Modifying Factors: pvd HPI Description: pt smoked until 5 years ago. cancer surgery 1 year ago. has ruq stoma. cv studies last year revealed non-compressible vessels. recent pe by pcp yielded some heart abnormality and he is scheduled for stress test. 5/24 2 new wounds on rt leg. left side healed 08/12/14; several small wounds on the right lateral leg and one wound on the left leg which is new. The edema control here does not look adequate. There is also some degree of maceration around the wounds on the right lateral leg READMISSION 09/14/2018 This is a patient we had in this clinic in 2013 and then again in 2016 with wounds on his right lateral leg secondary to chronic venous insufficiency. He was cared for I believe in 2016 by Dr. Lindon Romp. The patient is here for review of 6 small wounds within a large surgical area on his abdomen. The patient had surgery for a malignant neoplasm of the sigmoid colon with a colostomy. I cannot see the date of the exact surgery in epic. In February 2018 he had an attempted colostomy reversal however he had a microperforation I believe at the anastomosis. He required a repeat laparotomy. He had an ileostomy raised at that time. I believe the surgical wound at that time was  left to close via secondary intention. The patient is unaware whether he had a wound VAC on this. In any case this almost is completely closed except he has been left with 6 small eraser head sized areas on the abdomen that have not healed. I am not exactly sure how he was how he is been dressing this. He has been followed with frequent visits in general surgery dating back at least over a year. Past medical history includes chronic systolic heart failure, stage III chronic renal failure. Malignant neoplasm of the sigmoid colon, morbid obesity. 7/31; not a lot of change 6 small wounds within a large surgical area on his abdomen. None of these are hyper granulated as opposed to last week. None of them have exposed mesh or surrounding infection. He has been using [facility] 8/14- He comes after 2 weeks, he has only 2 open wounds that I can tell this time, he has been using Hydrofera Blue 8/28; 2-week follow-up. Very superficial areas on the abdominal surgical site scar. He is using Hydrofera Blue and this seems to be doing a good job. He has not made it down to the medical supply store to see if he can get an abdominal binder or something to keep the tension off this wound area. READMISSION 01/04/2019 This is a 69 year old man we had in the clinic from July through August 2020. He had wounds in the midline abdominal scar. [Please see history from my note of 09/14/2018}. When he was here last time we used Hydrofera Blue. Things seem to be improving. He did not discharge in a healed state his  last visit here was on 8/28. He states he continued to use the [facility] and at 1 point this was very close to healing but more recently it is reopened. The wounds are basically in the same state as last time superficial friable easily bleeding wounds. Some hyper granulation. He has an abdominal binder that he got at [facility] however he says it is too tight for him to get on. Past medical history is  reviewed; he has coronary artery disease, stage III chronic renal failure heart failure with reduced ejection fraction, COPD, hypertension, obstructive sleep apnea. He was hospitalized since he was here last time for a severe pharyngitis. 11/19; the patient was in the ER on 11/16; this was for oozing from his abdominal wound. He was cauterized with quick clot gauze and epi and silver nitrate. This was felt to be venous oozing. He has very friable wounds in this area. He is using his abdominal binder. 12/3; the patient was in the ER with bleeding from the lower part of his incision in the abdomen on 11/28. Had a stitch placed to contain the bleeding area. This was just underneath his major wound. He saw general surgery yesterday and apparently we are to remove the stitch in 2 weeks. The patient is on Plavix and aspirin for coronary artery disease with stents. His bleeding was felt to be capillary. 12/17; I removed the stitch from the bleeding area that was placed last time. This came out with some difficulty. The major area almost looks like slough with an area of cellophane over it. I did not attempt any further debridement 02/28/2019. The patient has new areas superiorly and inferiorly to the major wound in the middle. The area in the middle has a nonviable surface over perhaps 60 to 70% however debridement causes uncontrolled bleeding here. He is on Plavix. We have been usin hydrogel and Prisma. 1/21; the patient has 2 superficial areas superiorly he has the major wound in the middle and a small area inferiorly in the middle of this abdominal wound. We switch to Iodoflex last time 2/4; the patient has 3 open areas. 2 of these are on the right part of this surgical wound on his abdomen and one more on the left central. The 2 on the right are hyper granulated I used silver nitrate on these. The area on the left appears to have a fibrinous surface. I attempted to debride this once he ended up in the ER.  I am reluctant to do this again. We have been using Iodoflex 2/25; he has 3 open areas small ones proximally and distally and then the larger one in the middle. This has nonviable debris over fibrinous debris over the surface. He is using Iodoflex 3/30; I follow this man monthly. He has wounds in the middle of the surgical scar in his abdomen. T oday 2 wounds which is an improvement. The major wound in the middle and one inferiorly and medially. 4/26; I follow this patient every month. This is a palliative setting. Wounds in the middle part of his surgical scar in the abdomen. He had significant amounts of denuded skin around the central area. This wipes off with Anasept and gauze. Really not any major improvement. We did get an abdominal binder for him at 1 point as usual patients do not wear these 07/16/19-Patient is back at 1 month, he has 3 small wounds in the center of his surgical scar on his abdomen, these appear to be slightly larger.  He is not using the abdominal binder as it is very uncomfortable Electronic Signature(s) Signed: 07/16/2019 11:17:06 AM By: Tobi Bastos MD, MBA Entered By: Tobi Bastos on 07/16/2019 11:17:06 -------------------------------------------------------------------------------- Physical Exam Details Patient Name: Date of Service: Larry Bishop MS, [facility] RD L. 07/16/2019 10:15 A M Medical Record Number: [id number] Patient Account Number: [id number] Date of Birth/Sex: Treating RN: 1951-01-10 (69 y.o. Jerilynn Mages) Carlene Coria Primary Care Provider: Lin Landsman Other Clinician: Referring Provider: Treating Provider/Extender: Tracie Harrier, [name] in Treatment: 27 Constitutional alert and oriented x 3. sitting or standing blood pressure is within target range for patient.. supine blood pressure is within target range for patient.. pulse regular and within target range for patient.[name] Kitchen respirations regular, non-labored and within target range for patient.[name] Kitchen  temperature within target range for patient.. . . Well- nourished and well-hydrated in no acute distress. Notes Midline scar wounds x3, surface appears healthy, ostomy noted Electronic Signature(s) Signed: 07/16/2019 11:17:32 AM By: Tobi Bastos MD, MBA Entered By: Tobi Bastos on 07/16/2019 11:17:32 -------------------------------------------------------------------------------- Physician Orders Details Patient Name: Date of Service: Larry Bishop MS, [place] L. 07/16/2019 10:15 A M Medical Record Number: [id number] Patient Account Number: [id number] Date of Birth/Sex: Treating RN: 05/26/1950 (69 y.o. Jerilynn Mages) Carlene Coria Primary Care Provider: Lin Landsman Other Clinician: Referring Provider: Treating Provider/Extender: Tracie Harrier, [name] in Treatment: 67 Verbal / Phone Orders: No Diagnosis Coding ICD-10 Coding Code Description T81.31XD Disruption of external operation (surgical) wound, not elsewhere classified, subsequent encounter S31.105S Unspecified open wound of abdominal wall, periumbilic region without penetration into peritoneal cavity, sequela Follow-up Appointments Return appointment in 1 month. - earlier in the day. Dressing Change Frequency Change Dressing every other day. Wound Cleansing May shower and wash wound with soap and water. - with dressing change Primary Wound Dressing Wound #8 Abdomen - midline Calcium Alginate with Silver Wound #9 Distal Abdomen - midline Calcium Alginate with Silver Secondary Dressing Dry Gauze ABD pad Other: - abdominal binder while up during the day. may remove at night. Electronic Signature(s) Signed: 07/16/2019 5:17:39 PM By: Carlene Coria RN Signed: 07/18/2019 4:57:23 PM By: Tobi Bastos MD, MBA Entered By: Carlene Coria on 07/16/2019 10:34:45 -------------------------------------------------------------------------------- Problem List Details Patient Name: Date of Service: Larry Bishop MS, [place] L. 07/16/2019  10:15 A M Medical Record Number: [id number] Patient Account Number: [id number] Date of Birth/Sex: Treating RN: 02-19-1951 (69 y.o. Jerilynn Mages) Carlene Coria Primary Care Provider: Lin Landsman Other Clinician: Referring Provider: Treating Provider/Extender: Tracie Harrier, [name] in Treatment: 27 Active Problems ICD-10 Encounter Code Description Active Date MDM Diagnosis T81.31XD Disruption of external operation (surgical) wound, not elsewhere classified, 01/04/2019 No Yes subsequent encounter S31.105S Unspecified open wound of abdominal wall, periumbilic region without [id number] No Yes penetration into peritoneal cavity, sequela Inactive Problems Resolved Problems Electronic Signature(s) Signed: 07/16/2019 5:17:39 PM By: Carlene Coria RN Signed: 07/18/2019 4:57:23 PM By: Tobi Bastos MD, MBA Entered By: Carlene Coria on 07/16/2019 10:34:21 -------------------------------------------------------------------------------- Progress Note Details Patient Name: Date of Service: Larry Bishop MS, [place] L. 07/16/2019 10:15 A M Medical Record Number: [id number] Patient Account Number: [id number] Date of Birth/Sex: Treating RN: May 12, 1950 (69 y.o. Jerilynn Mages) Carlene Coria Primary Care Provider: Lin Landsman Other Clinician: Referring Provider: Treating Provider/Extender: Tracie Harrier, [name] in Treatment: 27 Subjective History of Present Illness (HPI) The following HPI elements were documented for the patient's wound: Location: Patient presents with a wound to left lower leg. Quality: Patient reports No Pain. Severity: Patient states wound(s) are getting worse. Duration: Patient has had the wound  for < 2 weeks prior to presenting for treatment Modifying Factors: pvd pt smoked until 5 years ago. cancer surgery 1 year ago. has ruq stoma. cv studies last year revealed non-compressible vessels. recent pe by pcp yielded some heart abnormality and he is scheduled for stress test.  5/24 2 new wounds on rt leg. left side healed 08/12/14; several small wounds on the right lateral leg and one wound on the left leg which is new. The edema control here does not look adequate. There is also some degree of maceration around the wounds on the right lateral leg READMISSION 09/14/2018 This is a patient we had in this clinic in 2013 and then again in 2016 with wounds on his right lateral leg secondary to chronic venous insufficiency. He was cared for I believe in 2016 by Dr. Lindon Romp. The patient is here for review of 6 small wounds within a large surgical area on his abdomen. The patient had surgery for a malignant neoplasm of the sigmoid colon with a colostomy. I cannot see the date of the exact surgery in epic. In February 2018 he had an attempted colostomy reversal however he had a microperforation I believe at the anastomosis. He required a repeat laparotomy. He had an ileostomy raised at that time. I believe the surgical wound at that time was left to close via secondary intention. The patient is unaware whether he had a wound VAC on this. In any case this almost is completely closed except he has been left with 6 small eraser head sized areas on the abdomen that have not healed. I am not exactly sure how he was how he is been dressing this. He has been followed with frequent visits in general surgery dating back at least over a year. Past medical history includes chronic systolic heart failure, stage III chronic renal failure. Malignant neoplasm of the sigmoid colon, morbid obesity. 7/31; not a lot of change 6 small wounds within a large surgical area on his abdomen. None of these are hyper granulated as opposed to last week. None of them have exposed mesh or surrounding infection. He has been using [facility] 8/14- He comes after 2 weeks, he has only 2 open wounds that I can tell this time, he has been using Hydrofera Blue 8/28; 2-week follow-up. Very superficial areas on the  abdominal surgical site scar. He is using Hydrofera Blue and this seems to be doing a good job. He has not made it down to the medical supply store to see if he can get an abdominal binder or something to keep the tension off this wound area. READMISSION 01/04/2019 This is a 69 year old man we had in the clinic from July through August 2020. He had wounds in the midline abdominal scar. [Please see history from my note of 09/14/2018}. When he was here last time we used Hydrofera Blue. Things seem to be improving. He did not discharge in a healed state his last visit here was on 8/28. He states he continued to use the [facility] and at 1 point this was very close to healing but more recently it is reopened. The wounds are basically in the same state as last time superficial friable easily bleeding wounds. Some hyper granulation. He has an abdominal binder that he got at [facility] however he says it is too tight for him to get on. Past medical history is reviewed; he has coronary artery disease, stage III chronic renal failure heart failure with reduced ejection fraction, COPD,  hypertension, obstructive sleep apnea. He was hospitalized since he was here last time for a severe pharyngitis. 11/19; the patient was in the ER on 11/16; this was for oozing from his abdominal wound. He was cauterized with quick clot gauze and epi and silver nitrate. This was felt to be venous oozing. He has very friable wounds in this area. He is using his abdominal binder. 12/3; the patient was in the ER with bleeding from the lower part of his incision in the abdomen on 11/28. Had a stitch placed to contain the bleeding area. This was just underneath his major wound. He saw general surgery yesterday and apparently we are to remove the stitch in 2 weeks. The patient is on Plavix and aspirin for coronary artery disease with stents. His bleeding was felt to be capillary. 12/17; I removed the stitch from the bleeding area  that was placed last time. This came out with some difficulty. The major area almost looks like slough with an area of cellophane over it. I did not attempt any further debridement 02/28/2019. The patient has new areas superiorly and inferiorly to the major wound in the middle. The area in the middle has a nonviable surface over perhaps 60 to 70% however debridement causes uncontrolled bleeding here. He is on Plavix. We have been usin hydrogel and Prisma. 1/21; the patient has 2 superficial areas superiorly he has the major wound in the middle and a small area inferiorly in the middle of this abdominal wound. We switch to Iodoflex last time 2/4; the patient has 3 open areas. 2 of these are on the right part of this surgical wound on his abdomen and one more on the left central. The 2 on the right are hyper granulated I used silver nitrate on these. The area on the left appears to have a fibrinous surface. I attempted to debride this once he ended up in the ER. I am reluctant to do this again. We have been using Iodoflex 2/25; he has 3 open areas small ones proximally and distally and then the larger one in the middle. This has nonviable debris over fibrinous debris over the surface. He is using Iodoflex 3/30; I follow this man monthly. He has wounds in the middle of the surgical scar in his abdomen. T oday 2 wounds which is an improvement. The major wound in the middle and one inferiorly and medially. 4/26; I follow this patient every month. This is a palliative setting. Wounds in the middle part of his surgical scar in the abdomen. He had significant amounts of denuded skin around the central area. This wipes off with Anasept and gauze. Really not any major improvement. We did get an abdominal binder for him at 1 point as usual patients do not wear these 07/16/19-Patient is back at 1 month, he has 3 small wounds in the center of his surgical scar on his abdomen, these appear to be slightly larger. He  is not using the abdominal binder as it is very uncomfortable Objective Constitutional alert and oriented x 3. sitting or standing blood pressure is within target range for patient.. supine blood pressure is within target range for patient.. pulse regular and within target range for patient.[name] Kitchen respirations regular, non-labored and within target range for patient.[name] Kitchen temperature within target range for patient.. Well- nourished and well-hydrated in no acute distress. Vitals Time Taken: 10:28 AM, Height: 71 in, Source: Stated, Weight: 344 lbs, Source: Stated, BMI: 48, Temperature: 98.3 F, Pulse: 95 bpm, Respiratory Rate:  18 breaths/min, Blood Pressure: 125/81 mmHg. General Notes: Midline scar wounds x3, surface appears healthy, ostomy noted Integumentary (Hair, Skin) Wound #8 status is Open. Original cause of wound was Surgical Injury. The wound is located on the Abdomen - midline. The wound measures 3cm length x 3cm width x 0.1cm depth; 7.069cm^2 area and 0.707cm^3 volume. There is Fat Layer (Subcutaneous Tissue) Exposed exposed. There is no tunneling or undermining noted. There is a medium amount of serous drainage noted. The wound margin is flat and intact. There is large (67-100%) red, pink granulation within the wound bed. There is a small (1-33%) amount of necrotic tissue within the wound bed including Adherent Slough. Wound #9 status is Open. Original cause of wound was Gradually Appeared. The wound is located on the Distal Abdomen - midline. The wound measures 1.3cm length x 1.8cm width x 0.1cm depth; 1.838cm^2 area and 0.184cm^3 volume. There is Fat Layer (Subcutaneous Tissue) Exposed exposed. There is no tunneling or undermining noted. There is a medium amount of serous drainage noted. The wound margin is flat and intact. There is large (67-100%) pink granulation within the wound bed. There is no necrotic tissue within the wound bed. Assessment Active Problems ICD-10 Disruption of  external operation (surgical) wound, not elsewhere classified, subsequent encounter Unspecified open wound of abdominal wall, periumbilic region without penetration into peritoneal cavity, sequela Plan Follow-up Appointments: Return appointment in 1 month. - earlier in the day. Dressing Change Frequency: Change Dressing every other day. Wound Cleansing: May shower and wash wound with soap and water. - with dressing change Primary Wound Dressing: Wound #8 Abdomen - midline: Calcium Alginate with Silver Wound #9 Distal Abdomen - midline: Calcium Alginate with Silver Secondary Dressing: Dry Gauze ABD pad Other: - abdominal binder while up during the day. may remove at night. -Continue silver alginate to the wounds -Have emphasized trying the binder to reduce the stretch is really going to help heal the wounds and also not develop new ones -Losing weight also would help but that is even more challenging effort Electronic Signature(s) Signed: 07/16/2019 11:18:20 AM By: Tobi Bastos MD, MBA Entered By: Tobi Bastos on 07/16/2019 11:18:20 -------------------------------------------------------------------------------- SuperBill Details Patient Name: Date of Service: Larry Bishop MS, [place] L. 07/16/2019 Medical Record Number: [id number] Patient Account Number: [id number] Date of Birth/Sex: Treating RN: 11-18-1950 (69 y.o. Jerilynn Mages) Carlene Coria Primary Care Provider: Lin Landsman Other Clinician: Referring Provider: Treating Provider/Extender: Tracie Harrier, [name] in Treatment: 27 Diagnosis Coding ICD-10 Codes Code Description T81.31XD Disruption of external operation (surgical) wound, not elsewhere classified, subsequent encounter S31.105S Unspecified open wound of abdominal wall, periumbilic region without penetration into peritoneal cavity, sequela Facility Procedures The patient participates with Medicare or their insurance follows the Medicare Facility Guidelines:  CPT4 Code Description Modifier Quantity [id number] [place] VISIT-LEV 3 EST PT 1 Physician Procedures : CPT4 Code Description Modifier [id number] - WC PHYS LEVEL 3 - EST PT ICD-10 Diagnosis Description T81.31XD Disruption of external operation (surgical) wound, not elsewhere classified, subsequent encounter Quantity: 1 Electronic Signature(s) Signed: 07/16/2019 11:18:33 AM By: Tobi Bastos MD, MBA Entered By: Tobi Bastos on 07/16/2019 11:18:32

## 2019-07-24 ENCOUNTER — Other Ambulatory Visit: Payer: Self-pay | Admitting: Nurse Practitioner

## 2019-07-27 ENCOUNTER — Other Ambulatory Visit: Payer: Self-pay | Admitting: Nurse Practitioner

## 2019-07-27 DIAGNOSIS — I259 Chronic ischemic heart disease, unspecified: Secondary | ICD-10-CM

## 2019-07-27 DIAGNOSIS — I5022 Chronic systolic (congestive) heart failure: Secondary | ICD-10-CM

## 2019-07-27 DIAGNOSIS — E7800 Pure hypercholesterolemia, unspecified: Secondary | ICD-10-CM

## 2019-07-27 DIAGNOSIS — I471 Supraventricular tachycardia: Secondary | ICD-10-CM

## 2019-07-27 DIAGNOSIS — R0602 Shortness of breath: Secondary | ICD-10-CM

## 2019-08-01 ENCOUNTER — Other Ambulatory Visit: Payer: Self-pay | Admitting: Nurse Practitioner

## 2019-08-16 ENCOUNTER — Encounter (HOSPITAL_BASED_OUTPATIENT_CLINIC_OR_DEPARTMENT_OTHER): Payer: Medicare Other | Attending: Internal Medicine | Admitting: Internal Medicine

## 2019-08-16 DIAGNOSIS — T8131XD Disruption of external operation (surgical) wound, not elsewhere classified, subsequent encounter: Secondary | ICD-10-CM | POA: Diagnosis not present

## 2019-08-16 DIAGNOSIS — G4733 Obstructive sleep apnea (adult) (pediatric): Secondary | ICD-10-CM | POA: Insufficient documentation

## 2019-08-16 DIAGNOSIS — I2510 Atherosclerotic heart disease of native coronary artery without angina pectoris: Secondary | ICD-10-CM | POA: Diagnosis not present

## 2019-08-16 DIAGNOSIS — Z7982 Long term (current) use of aspirin: Secondary | ICD-10-CM | POA: Insufficient documentation

## 2019-08-16 DIAGNOSIS — Z85038 Personal history of other malignant neoplasm of large intestine: Secondary | ICD-10-CM | POA: Insufficient documentation

## 2019-08-16 DIAGNOSIS — Z933 Colostomy status: Secondary | ICD-10-CM | POA: Insufficient documentation

## 2019-08-16 DIAGNOSIS — I739 Peripheral vascular disease, unspecified: Secondary | ICD-10-CM | POA: Insufficient documentation

## 2019-08-16 DIAGNOSIS — I130 Hypertensive heart and chronic kidney disease with heart failure and stage 1 through stage 4 chronic kidney disease, or unspecified chronic kidney disease: Secondary | ICD-10-CM | POA: Diagnosis not present

## 2019-08-16 DIAGNOSIS — J449 Chronic obstructive pulmonary disease, unspecified: Secondary | ICD-10-CM | POA: Insufficient documentation

## 2019-08-16 DIAGNOSIS — N183 Chronic kidney disease, stage 3 unspecified: Secondary | ICD-10-CM | POA: Insufficient documentation

## 2019-08-16 DIAGNOSIS — Z7902 Long term (current) use of antithrombotics/antiplatelets: Secondary | ICD-10-CM | POA: Diagnosis not present

## 2019-08-16 DIAGNOSIS — Z6841 Body Mass Index (BMI) 40.0 and over, adult: Secondary | ICD-10-CM | POA: Diagnosis not present

## 2019-08-16 DIAGNOSIS — Z955 Presence of coronary angioplasty implant and graft: Secondary | ICD-10-CM | POA: Insufficient documentation

## 2019-08-16 DIAGNOSIS — I5022 Chronic systolic (congestive) heart failure: Secondary | ICD-10-CM | POA: Insufficient documentation

## 2019-08-16 DIAGNOSIS — X58XXXA Exposure to other specified factors, initial encounter: Secondary | ICD-10-CM | POA: Diagnosis not present

## 2019-08-16 NOTE — Progress Notes (Signed)
Delawder, Connie L. ([id number]) Visit Report for 08/16/2019 Arrival Information Details Patient Name: Date of Service: Lyman Bishop MS, [facility] RD L. 08/16/2019 10:15 A M Medical Record Number: [id number] Patient Account Number: [id number] Date of Birth/Sex: Treating RN: 04-17-1950 (69 y.o. [name] Primary Care Shuntay Everetts: Lin Landsman Other Clinician: Referring Dionisios Ricci: Treating Harmoni Lucus/Extender: Shelly Rubenstein, [name] in Treatment: 21 Visit Information History Since Last Visit Added or deleted any medications: No Patient Arrived: Ambulatory Any new allergies or adverse reactions: No Arrival Time: 10:08 Had a fall or experienced change in No Accompanied By: self activities of daily living that may affect Transfer Assistance: None risk of falls: Patient Identification Verified: Yes Signs or symptoms of abuse/neglect since last visito No Secondary Verification Process Completed: Yes Hospitalized since last visit: No Patient Requires Transmission-Based Precautions: No Implantable device outside of the clinic excluding No Patient Has Alerts: No cellular tissue based products placed in the center since last visit: Has Dressing in Place as Prescribed: Yes Pain Present Now: No Electronic Signature(s) Signed: 08/16/2019 5:05:03 PM By: Deon Pilling Entered By: Deon Pilling on 08/16/2019 10:15:55 -------------------------------------------------------------------------------- Encounter Discharge Information Details Patient Name: Date of Service: Lyman Bishop MS, [facility] RD L. 08/16/2019 10:15 A M Medical Record Number: [id number] Patient Account Number: [id number] Date of Birth/Sex: Treating RN: 1950/03/19 (69 y.o. [name] Primary Care Britnie Colville: Lin Landsman Other Clinician: Referring Stephfon Bovey: Treating Mahati Vajda/Extender: Lavella Lemons Weeks in Treatment: 53 Encounter Discharge Information Items Discharge Condition: Stable Ambulatory Status:  Ambulatory Discharge Destination: Home Transportation: Private Auto Accompanied By: self Schedule Follow-up Appointment: Yes Clinical Summary of Care: Patient Declined Electronic Signature(s) Signed: 08/16/2019 5:01:22 PM By: Kela Millin Entered By: Kela Millin on 08/16/2019 10:59:47 -------------------------------------------------------------------------------- Lower Extremity Assessment Details Patient Name: Date of Service: Lyman Bishop MS, [facility] RD L. 08/16/2019 10:15 A M Medical Record Number: [id number] Patient Account Number: [id number] Date of Birth/Sex: Treating RN: [birth year] (69 y.o. [name] Primary Care Deval Mroczka: Lin Landsman Other Clinician: Referring Tanice Petre: Treating Quan Cybulski/Extender: Shelly Rubenstein, [name] in Treatment: 32 Electronic Signature(s) Signed: 08/16/2019 5:05:03 PM By: Deon Pilling Entered By: Deon Pilling on 08/16/2019 10:16:25 -------------------------------------------------------------------------------- Multi Wound Chart Details Patient Name: Date of Service: Lyman Bishop MS, B[facility]FE[facility] RD L. 08/16/2019 10:15 A M Medical Record Number: [id number] Patient Account Number: [id number] Date of Birth/Sex: Treating RN: November 27, 1950 (69 y.o. [name] Primary Care Pristine Gladhill: Lin Landsman Other Clinician: Referring Nayely Dingus: Treating Overton Boggus/Extender: Shelly Rubenstein, [name] in Treatment: 32 Vital Signs Height(in): 71 Pulse(bpm): 92 Weight(lbs): 344 Blood Pressure(mmHg): 128/74 Body Mass Index(BMI): 48 Temperature(F): 98.5 Respiratory Rate(breaths/min): 20 Photos: [8:No Photos Abdomen - midline] [9:No Photos Distal Abdomen - midline] [N/A:N/A N/A] Wound Location: [8:Surgical Injury] [9:Gradually Appeared] [N/A:N/A] Wounding Event: [8:Open Surgical Wound] [9:Open Surgical Wound] [N/A:N/A] Primary Etiology: [8:Asthma, Chronic Obstructive] [9:Asthma, Chronic Obstructive] [N/A:N/A] Comorbid History:  [8:Pulmonary Disease (COPD), Sleep Apnea, Arrhythmia, Congestive Heart Failure, Coronary Artery Disease, Hypertension, Peripheral Arterial Disease, Peripheral Venous Disease 02/21/2017] [9:Pulmonary Disease (COPD), Sleep Apnea,  Arrhythmia, Congestive Heart Failure, Coronary Artery Disease, Hypertension, Peripheral Arterial Disease, Peripheral Venous Disease 02/28/2019] [N/A:N/A] Date Acquired: [8:32] [9:24] [N/A:N/A] Weeks of Treatment: [8:Open] [9:Open] [N/A:N/A] Wound Status: [8:Yes] [9:No] [N/A:N/A] Clustered Wound: [8:1.8x1.5x0.1] [9:1x1.2x0.1] [N/A:N/A] Measurements L x W x D (cm) [8:2.121] [9:0.942] [N/A:N/A] A (cm) : rea [8:0.212] [9:0.094] [N/A:N/A] Volume (cm) : [8:97.30%] [9:0.00%] [N/A:N/A] % Reduction in Area: [8:97.30%] [9:0.00%] [N/A:N/A] % Reduction in Volume: [8:Full Thickness Without Exposed] [9:Full Thickness Without Exposed] [N/A:N/A] Classification: [8:Support Structures Medium] [9:Support Structures Medium] [N/A:N/A] Exudate  Amount: [8:Serous] [9:Serous] [N/A:N/A] Exudate Type: [8:amber] [9:amber] [N/A:N/A] Exudate Color: [8:Flat and Intact] [9:Flat and Intact] [N/A:N/A] Wound Margin: [8:Large (67-100%)] [9:Large (67-100%)] [N/A:N/A] Granulation Amount: [8:Red, Pink] [9:Pink] [N/A:N/A] Granulation Quality: [8:None Present (0%)] [9:None Present (0%)] [N/A:N/A] Necrotic Amount: [8:Fat Layer (Subcutaneous Tissue)] [9:Fat Layer (Subcutaneous Tissue)] [N/A:N/A] Exposed Structures: [8:Exposed: Yes Fascia: No Tendon: No Muscle: No Joint: No Bone: No Large (67-100%)] [9:Exposed: Yes Fascia: No Tendon: No Muscle: No Joint: No Bone: No Medium (34-66%)] [N/A:N/A] Epithelialization: [8:Chemical Cauterization] [9:Chemical Cauterization] [N/A:N/A] Treatment Notes Electronic Signature(s) Signed: 08/16/2019 5:01:22 PM By: Kela Millin Signed: 08/16/2019 5:27:17 PM By: Linton Ham MD Entered By: Linton Ham on 08/16/2019  10:56:44 -------------------------------------------------------------------------------- Multi-Disciplinary Care Plan Details Patient Name: Date of Service: Lyman Bishop MS, BENFEA RD L. 08/16/2019 10:15 A M Medical Record Number: [id number] Patient Account Number: [id number] Date of Birth/Sex: Treating RN: 01-Jul-1950 (69 y.o. [name] Primary Care Nataly Pacifico: Lin Landsman Other Clinician: Referring Veera Stapleton: Treating Winfrey Chillemi/Extender: Shelly Rubenstein, [name] in Treatment: 32 Active Inactive Nutrition Nursing Diagnoses: Potential for alteratiion in Nutrition/Potential for imbalanced nutrition Goals: Patient/caregiver agrees to and verbalizes understanding of need to obtain nutritional consultation Date Initiated: 03/28/2019 Target Resolution Date: 09/13/2019 Goal Status: Active Interventions: Provide education on nutrition Treatment Activities: Education provided on Nutrition : 07/16/2019 Notes: Electronic Signature(s) Signed: 08/16/2019 5:01:22 PM By: Kela Millin Entered By: Kela Millin on 08/16/2019 10:20:49 -------------------------------------------------------------------------------- Pain Assessment Details Patient Name: Date of Service: Lyman Bishop MS, B[facility]FE[facility] RD L. 08/16/2019 10:15 A M Medical Record Number: [id number] Patient Account Number: [id number] Date of Birth/Sex: Treating RN: Jun 06, 1950 (69 y.o. [name] Primary Care Jameia Makris: Lin Landsman Other Clinician: Referring Emilee Market: Treating Naida Escalante/Extender: Shelly Rubenstein, [name] in Treatment: 32 Active Problems Location of Pain Severity and Description of Pain Patient Has Paino No Site Locations Rate the pain. Current Pain Level: 0 Pain Management and Medication Current Pain Management: Medication: No Cold Application: No Rest: No Massage: No Activity: No T.E.N.S.: No Heat Application: No Leg drop or elevation: No Is the Current Pain Management Adequate:  Adequate How does your wound impact your activities of daily livingo Sleep: No Bathing: No Appetite: No Relationship With Others: No Bladder Continence: No Emotions: No Bowel Continence: No Work: No Toileting: No Drive: No Dressing: No Hobbies: No Electronic Signature(s) Signed: 08/16/2019 5:05:03 PM By: Deon Pilling Entered By: Deon Pilling on 08/16/2019 10:16:20 -------------------------------------------------------------------------------- Patient/Caregiver Education Details Patient Name: Date of Service: Gladys Damme, [place] 6/25/2021andnbsp10:15 A M Medical Record Number: [id number] Patient Account Number: [id number] Date of Birth/Gender: Treating RN: November 06, 1950 (69 y.o. [name] Primary Care Physician: Lin Landsman Other Clinician: Referring Physician: Treating Physician/Extender: Nelida Meuse in Treatment: 35 Education Assessment Education Provided To: Patient Education Topics Provided Nutrition: Handouts: Elevated Blood Sugars: How Do They Affect Wound Healing Methods: Explain/Verbal Responses: State content correctly Electronic Signature(s) Signed: 08/16/2019 5:01:22 PM By: Kela Millin Entered By: Kela Millin on 08/16/2019 10:21:08 -------------------------------------------------------------------------------- Wound Assessment Details Patient Name: Date of Service: Lyman Bishop MS, B[facility]FE[facility] RD L. 08/16/2019 10:15 A M Medical Record Number: [id number] Patient Account Number: [id number] Date of Birth/Sex: Treating RN: 31-May-1950 (69 y.o. [name] Primary Care Jousha Schwandt: Lin Landsman Other Clinician: Referring Zalayah Pizzuto: Treating Kingsly Kloepfer/Extender: Shelly Rubenstein, [name] in Treatment: 32 Wound Status Wound Number: 8 Primary Open Surgical Wound Etiology: Wound Location: Abdomen - midline Wound Open Wounding Event: Surgical Injury Status: Date Acquired: 02/21/2017 Comorbid Asthma, Chronic  Obstructive Pulmonary Disease (COPD), Sleep Weeks Of Treatment: 32 History: Apnea,  Arrhythmia, Congestive Heart Failure, Coronary Artery Clustered Wound: Yes Disease, Hypertension, Peripheral Arterial Disease, Peripheral Venous Disease Wound Measurements Length: (cm) 1.8 Width: (cm) 1.5 Depth: (cm) 0.1 Area: (cm) 2.121 Volume: (cm) 0.212 % Reduction in Area: 97.3% % Reduction in Volume: 97.3% Epithelialization: Large (67-100%) Tunneling: No Undermining: No Wound Description Classification: Full Thickness Without Exposed Support Structures Wound Margin: Flat and Intact Exudate Amount: Medium Exudate Type: Serous Exudate Color: amber Foul Odor After Cleansing: No Slough/Fibrino No Wound Bed Granulation Amount: Large (67-100%) Exposed Structure Granulation Quality: Red, Pink Fascia Exposed: No Necrotic Amount: None Present (0%) Fat Layer (Subcutaneous Tissue) Exposed: Yes Tendon Exposed: No Muscle Exposed: No Joint Exposed: No Bone Exposed: No Treatment Notes Wound #8 (Abdomen - midline) 1. Cleanse With Wound Cleanser 2. Periwound Care Skin Prep 3. Primary Dressing Applied Iodoflex 4. Secondary Dressing ABD Pad Dry Gauze 5. Secured With Recruitment consultant) Signed: 08/16/2019 5:05:03 PM By: Deon Pilling Entered By: Deon Pilling on 08/16/2019 10:16:46 -------------------------------------------------------------------------------- Wound Assessment Details Patient Name: Date of Service: Lyman Bishop MS, BENFEA RD L. 08/16/2019 10:15 A M Medical Record Number: [id number] Patient Account Number: [id number] Date of Birth/Sex: Treating RN: [birth year] (69 y.o. [name] Primary Care Tyanne Derocher: Lin Landsman Other Clinician: Referring Steaven Wholey: Treating Caroleen Stoermer/Extender: Shelly Rubenstein, [name] in Treatment: 32 Wound Status Wound Number: 9 Primary Open Surgical Wound Etiology: Wound Location: Distal Abdomen - midline Wound Open Wounding  Event: Gradually Appeared Status: Date Acquired: 02/28/2019 Comorbid Asthma, Chronic Obstructive Pulmonary Disease (COPD), Sleep Weeks Of Treatment: 24 History: Apnea, Arrhythmia, Congestive Heart Failure, Coronary Artery Clustered Wound: No Disease, Hypertension, Peripheral Arterial Disease, Peripheral Venous Disease Wound Measurements Length: (cm) 1 Width: (cm) 1.2 Depth: (cm) 0.1 Area: (cm) 0.942 Volume: (cm) 0.094 % Reduction in Area: 0% % Reduction in Volume: 0% Epithelialization: Medium (34-66%) Tunneling: No Undermining: No Wound Description Classification: Full Thickness Without Exposed Support Structures Wound Margin: Flat and Intact Exudate Amount: Medium Exudate Type: Serous Exudate Color: amber Foul Odor After Cleansing: No Slough/Fibrino No Wound Bed Granulation Amount: Large (67-100%) Exposed Structure Granulation Quality: Pink Fascia Exposed: No Necrotic Amount: None Present (0%) Fat Layer (Subcutaneous Tissue) Exposed: Yes Tendon Exposed: No Muscle Exposed: No Joint Exposed: No Bone Exposed: No Treatment Notes Wound #9 (Distal Abdomen - midline) 1. Cleanse With Wound Cleanser 2. Periwound Care Skin Prep 3. Primary Dressing Applied Iodoflex 4. Secondary Dressing ABD Pad Dry Gauze 5. Secured With Recruitment consultant) Signed: 08/16/2019 5:05:03 PM By: Deon Pilling Entered By: Deon Pilling on 08/16/2019 10:17:05 -------------------------------------------------------------------------------- Vitals Details Patient Name: Date of Service: Lyman Bishop MS, B[facility]FE[facility] RD L. 08/16/2019 10:15 A M Medical Record Number: [id number] Patient Account Number: [id number] Date of Birth/Sex: Treating RN: 02-Feb-1951 (69 y.o. [name] Primary Care Chrystie Hagwood: Lin Landsman Other Clinician: Referring Rozann Holts: Treating Levon Penning/Extender: Shelly Rubenstein, [name] in Treatment: 32 Vital Signs Time Taken: 10:09 Temperature (F): 98.5 Height  (in): 71 Pulse (bpm): 92 Weight (lbs): 344 Respiratory Rate (breaths/min): 20 Body Mass Index (BMI): 48 Blood Pressure (mmHg): 128/74 Reference Range: 80 - 120 mg / dl Electronic Signature(s) Signed: 08/16/2019 5:05:03 PM By: Deon Pilling Entered By: Deon Pilling on 08/16/2019 10:16:09

## 2019-08-16 NOTE — Progress Notes (Signed)
[name], [name] ([id number]) Visit Report for 08/16/2019 HPI Details Patient Name: Date of Service: Larry Bishop MS, Larry [name] RD L. 08/16/2019 10:15 A M Medical Record Number: [id number] Patient Account Number: [id number] Date of Birth/Sex: Treating RN: 1950/05/01 (69 y.o. Larry [name] Primary Care Provider: Lin [name] Other Clinician: Referring Provider: Treating Provider/Extender: Larry [name], Larry [name] in Treatment: 32 History of Present Illness Location: Patient presents with a wound to left lower leg. Quality: Patient reports No Pain. Severity: Patient states wound(s) are getting worse. Duration: Patient has had the wound for < 2 [name] prior to presenting for treatment Modifying Factors: pvd HPI Description: pt smoked until 5 years ago. cancer surgery 1 year ago. has ruq stoma. cv studies last year revealed non-compressible vessels. recent pe by pcp yielded some heart abnormality and he is scheduled for stress test. 5/24 2 new wounds on rt leg. left side healed 08/12/14; several small wounds on the right lateral leg and one wound on the left leg which is new. The edema control here does not look adequate. There is also some degree of maceration around the wounds on the right lateral leg READMISSION 09/14/2018 This is a patient we had in this clinic in 2013 and then again in 2016 with wounds on his right lateral leg secondary to chronic venous insufficiency. He was cared for I believe in 2016 by Dr. Lindon [name]. The patient is here for review of 6 small wounds within a large surgical area on his abdomen. The patient had surgery for a malignant neoplasm of the sigmoid colon with a colostomy. I cannot see the date of the exact surgery in epic. In February 2018 he had an attempted colostomy reversal however he had a microperforation I believe at the anastomosis. He required a repeat laparotomy. He had an ileostomy raised at that time. I believe the surgical wound at that time  was left to close via secondary intention. The patient is unaware whether he had a wound VAC on this. In any case this almost is completely closed except he has been left with 6 small eraser head sized areas on the abdomen that have not healed. I am not exactly sure how he was how he is been dressing this. He has been followed with frequent visits in general surgery dating back at least over a year. Past medical history includes chronic systolic heart failure, stage III chronic renal failure. Malignant neoplasm of the sigmoid colon, morbid obesity. 7/31; not a lot of change 6 small wounds within a large surgical area on his abdomen. None of these are hyper granulated as opposed to last week. None of them have exposed mesh or surrounding infection. He has been using [name] 8/14- He comes after 2 [name], he has only 2 open wounds that I can tell this time, he has been using Hydrofera Blue 8/28; 2-week follow-up. Very superficial areas on the abdominal surgical site scar. He is using Hydrofera Blue and this seems to be doing a good job. He has not made it down to the medical supply store to see if he can get an abdominal binder or something to keep the tension off this wound area. READMISSION 01/04/2019 This is a 69 year old man we had in the clinic from July through August 2020. He had wounds in the midline abdominal scar. [Please see history from my note of 09/14/2018}. When he was here last time we used Hydrofera Blue. Things seem to be improving. He did not discharge in a healed state his  last visit here was on 8/28. He states he continued to use the [name] and at 1 point this was very close to healing but more recently it is reopened. The wounds are basically in the same state as last time superficial friable easily bleeding wounds. Some hyper granulation. He has an abdominal binder that he got at [name] however he says it is too tight for him to get on. Past medical history is  reviewed; he has coronary artery disease, stage III chronic renal failure heart failure with reduced ejection fraction, COPD, hypertension, obstructive sleep apnea. He was hospitalized since he was here last time for a severe pharyngitis. 11/19; the patient was in the ER on 11/16; this was for oozing from his abdominal wound. He was cauterized with quick clot gauze and epi and silver nitrate. This was felt to be venous oozing. He has very friable wounds in this area. He is using his abdominal binder. 12/3; the patient was in the ER with bleeding from the lower part of his incision in the abdomen on 11/28. Had a stitch placed to contain the bleeding area. This was just underneath his major wound. He saw general surgery yesterday and apparently we are to remove the stitch in 2 [name]. The patient is on Plavix and aspirin for coronary artery disease with stents. His bleeding was felt to be capillary. 12/17; I removed the stitch from the bleeding area that was placed last time. This came out with some difficulty. The major area almost looks like slough with an area of cellophane over it. I did not attempt any further debridement 02/28/2019. The patient has new areas superiorly and inferiorly to the major wound in the middle. The area in the middle has a nonviable surface over perhaps 60 to 70% however debridement causes uncontrolled bleeding here. He is on Plavix. We have been usin hydrogel and Prisma. 1/21; the patient has 2 superficial areas superiorly he has the major wound in the middle and a small area inferiorly in the middle of this abdominal wound. We switch to Iodoflex last time 2/4; the patient has 3 open areas. 2 of these are on the right part of this surgical wound on his abdomen and one more on the left central. The 2 on the right are hyper granulated I used silver nitrate on these. The area on the left appears to have a fibrinous surface. I attempted to debride this once he ended up in the ER.  I am reluctant to do this again. We have been using Iodoflex 2/25; he has 3 open areas small ones proximally and distally and then the larger one in the middle. This has nonviable debris over fibrinous debris over the surface. He is using Iodoflex 3/30; I follow this man monthly. He has wounds in the middle of the surgical scar in his abdomen. T oday 2 wounds which is an improvement. The major wound in the middle and one inferiorly and medially. 4/26; I follow this patient every month. This is a palliative setting. Wounds in the middle part of his surgical scar in the abdomen. He had significant amounts of denuded skin around the central area. This wipes off with Anasept and gauze. Really not any major improvement. We did get an abdominal binder for him at 1 point as usual patients do not wear these 07/16/19-Patient is back at 1 month, he has 3 small wounds in the [name] of his surgical scar on his abdomen, these appear to be slightly larger.  He is not using the abdominal binder as it is very uncomfortable 6/25; monthly follow-up. He is down to 2 open areas on the abdomen which appeared somewhat better today the larger area is superiorly and a small area inferiorly. There is debris on the surface of the superior 1 under illumination however very friable tissue. As opposed to what I said a month ago he is still using Iodoflex with some improvement in surface areas Electronic Signature(s) Signed: 08/16/2019 5:27:17 PM By: Linton Ham MD Entered By: Linton Ham on 08/16/2019 10:57:45 -------------------------------------------------------------------------------- Chemical Cauterization Details Patient Name: Date of Service: Larry Bishop MS, BENFEA RD L. 08/16/2019 10:15 A M Medical Record Number: [id number] Patient Account Number: [id number] Date of Birth/Sex: Treating RN: 04-13-1950 (69 y.o. Larry [name] Primary Care Provider: Lin [name] Other Clinician: Referring Provider: Treating  Provider/Extender: Larry [name], Larry [name] in Treatment: 32 Procedure Performed for: Wound #8 Abdomen - midline Performed By: Physician Ricard Dillon., MD Post Procedure Diagnosis Same as Pre-procedure Electronic Signature(s) Signed: 08/16/2019 5:27:17 PM By: Linton Ham MD Entered By: Linton Ham on 08/16/2019 10:56:53 -------------------------------------------------------------------------------- Chemical Cauterization Details Patient Name: Date of Service: Larry Bishop MS, BENFEA RD L. 08/16/2019 10:15 A M Medical Record Number: [id number] Patient Account Number: [id number] Date of Birth/Sex: Treating RN: 1950-12-07 (69 y.o. Larry [name] Primary Care Provider: Lin [name] Other Clinician: Referring Provider: Treating Provider/Extender: Larry [name], Larry [name] in Treatment: 32 Procedure Performed for: Wound #9 Distal Abdomen - midline Performed By: Physician Ricard Dillon., MD Post Procedure Diagnosis Same as Pre-procedure Electronic Signature(s) Signed: 08/16/2019 5:27:17 PM By: Linton Ham MD Entered By: Linton Ham on 08/16/2019 10:57:00 -------------------------------------------------------------------------------- Physical Exam Details Patient Name: Date of Service: Larry Bishop MS, BENFEA RD L. 08/16/2019 10:15 A M Medical Record Number: [id number] Patient Account Number: [id number] Date of Birth/Sex: Treating RN: [birth year] (69 y.o. Larry [name] Primary Care Provider: Lin [name] Other Clinician: Referring Provider: Treating Provider/Extender: Larry [name], Larry [name] in Treatment: 32 Constitutional Sitting or standing Blood Pressure is within target range for patient.. Pulse regular and within target range for patient.[name] Kitchen Respirations regular, non-labored and within target range.. Temperature is normal and within the target range for the patient.[name] Kitchen Appears in no distress. Gastrointestinal (GI) Abdomen is  soft.. Notes Wound exam; midline scar wounds x2. Surfaces still have some debris very friable tissue I used silver nitrate to cauterize this to see if this would allow some further epithelialization there is no evidence of surrounding infection Electronic Signature(s) Signed: 08/16/2019 5:27:17 PM By: Linton Ham MD Entered By: Linton Ham on 08/16/2019 10:58:29 -------------------------------------------------------------------------------- Physician Orders Details Patient Name: Date of Service: Larry Bishop MS, BENFEA RD L. 08/16/2019 10:15 A M Medical Record Number: [id number] Patient Account Number: [id number] Date of Birth/Sex: Treating RN: 1950-06-09 (69 y.o. Larry [name] Primary Care Provider: Lin [name] Other Clinician: Referring Provider: Treating Provider/Extender: Larry [name], Larry [name] in Treatment: 80 Verbal / Phone Orders: No Diagnosis Coding ICD-10 Coding Code Description T81.31XD Disruption of external operation (surgical) wound, not elsewhere classified, subsequent encounter S31.105S Unspecified open wound of abdominal wall, periumbilic region without penetration into peritoneal cavity, sequela Follow-up Appointments Return appointment in 1 month. - earlier in the day. Dressing Change Frequency Change Dressing every other day. Wound Cleansing May shower and wash wound with soap and water. - with dressing change Primary Wound Dressing Wound #8 Abdomen - midline Iodoflex Wound #9 Distal Abdomen - midline Iodoflex Secondary Dressing Dry Gauze ABD pad Other: - abdominal binder while up during  the day. may remove at night. Electronic Signature(s) Signed: 08/16/2019 5:01:22 PM By: Kela Millin Signed: 08/16/2019 5:27:17 PM By: Linton Ham MD Entered By: Kela Millin on 08/16/2019 10:48:45 -------------------------------------------------------------------------------- Problem List Details Patient Name: Date of Service: Larry Bishop  MS, BENFEA RD L. 08/16/2019 10:15 A M Medical Record Number: [id number] Patient Account Number: [id number] Date of Birth/Sex: Treating RN: 03/26/1950 (69 y.o. Larry [name] Primary Care Provider: Lin [name] Other Clinician: Referring Provider: Treating Provider/Extender: Larry [name], Larry [name] in Treatment: 32 Active Problems ICD-10 Encounter Code Description Active Date MDM Diagnosis T81.31XD Disruption of external operation (surgical) wound, not elsewhere classified, 01/04/2019 No Yes subsequent encounter S31.105S Unspecified open wound of abdominal wall, periumbilic region without 23/30/0762 No Yes penetration into peritoneal cavity, sequela Inactive Problems Resolved Problems Electronic Signature(s) Signed: 08/16/2019 5:27:17 PM By: Linton Ham MD Entered By: Linton Ham on 08/16/2019 10:56:37 -------------------------------------------------------------------------------- Progress Note Details Patient Name: Date of Service: Larry Bishop MS, BENFEA RD L. 08/16/2019 10:15 A M Medical Record Number: [id number] Patient Account Number: [id number] Date of Birth/Sex: Treating RN: August 01, 1950 (69 y.o. Larry [name] Primary Care Provider: Lin [name] Other Clinician: Referring Provider: Treating Provider/Extender: Larry [name], Larry [name] in Treatment: 32 Subjective History of Present Illness (HPI) The following HPI elements were documented for the patient's wound: Location: Patient presents with a wound to left lower leg. Quality: Patient reports No Pain. Severity: Patient states wound(s) are getting worse. Duration: Patient has had the wound for < 2 [name] prior to presenting for treatment Modifying Factors: pvd pt smoked until 5 years ago. cancer surgery 1 year ago. has ruq stoma. cv studies last year revealed non-compressible vessels. recent pe by pcp yielded some heart abnormality and he is scheduled for stress test. 5/24 2 new  wounds on rt leg. left side healed 08/12/14; several small wounds on the right lateral leg and one wound on the left leg which is new. The edema control here does not look adequate. There is also some degree of maceration around the wounds on the right lateral leg READMISSION 09/14/2018 This is a patient we had in this clinic in 2013 and then again in 2016 with wounds on his right lateral leg secondary to chronic venous insufficiency. He was cared for I believe in 2016 by Dr. Lindon [name]. The patient is here for review of 6 small wounds within a large surgical area on his abdomen. The patient had surgery for a malignant neoplasm of the sigmoid colon with a colostomy. I cannot see the date of the exact surgery in epic. In February 2018 he had an attempted colostomy reversal however he had a microperforation I believe at the anastomosis. He required a repeat laparotomy. He had an ileostomy raised at that time. I believe the surgical wound at that time was left to close via secondary intention. The patient is unaware whether he had a wound VAC on this. In any case this almost is completely closed except he has been left with 6 small eraser head sized areas on the abdomen that have not healed. I am not exactly sure how he was how he is been dressing this. He has been followed with frequent visits in general surgery dating back at least over a year. Past medical history includes chronic systolic heart failure, stage III chronic renal failure. Malignant neoplasm of the sigmoid colon, morbid obesity. 7/31; not a lot of change 6 small wounds within a large surgical area on his abdomen. None of these are hyper granulated as opposed  to last week. None of them have exposed mesh or surrounding infection. He has been using [name] 8/14- He comes after 2 [name], he has only 2 open wounds that I can tell this time, he has been using Hydrofera Blue 8/28; 2-week follow-up. Very superficial areas on the abdominal  surgical site scar. He is using Hydrofera Blue and this seems to be doing a good job. He has not made it down to the medical supply store to see if he can get an abdominal binder or something to keep the tension off this wound area. READMISSION 01/04/2019 This is a 69 year old man we had in the clinic from July through August 2020. He had wounds in the midline abdominal scar. [Please see history from my note of 09/14/2018}. When he was here last time we used Hydrofera Blue. Things seem to be improving. He did not discharge in a healed state his last visit here was on 8/28. He states he continued to use the [facility] and at 1 point this was very close to healing but more recently it is reopened. The wounds are basically in the same state as last time superficial friable easily bleeding wounds. Some hyper granulation. He has an abdominal binder that he got at [facility] however he says it is too tight for him to get on. Past medical history is reviewed; he has coronary artery disease, stage III chronic renal failure heart failure with reduced ejection fraction, COPD, hypertension, obstructive sleep apnea. He was hospitalized since he was here last time for a severe pharyngitis. 11/19; the patient was in the ER on 11/16; this was for oozing from his abdominal wound. He was cauterized with quick clot gauze and epi and silver nitrate. This was felt to be venous oozing. He has very friable wounds in this area. He is using his abdominal binder. 12/3; the patient was in the ER with bleeding from the lower part of his incision in the abdomen on 11/28. Had a stitch placed to contain the bleeding area. This was just underneath his major wound. He saw general surgery yesterday and apparently we are to remove the stitch in 2 [name]. The patient is on Plavix and aspirin for coronary artery disease with stents. His bleeding was felt to be capillary. 12/17; I removed the stitch from the bleeding area that was  placed last time. This came out with some difficulty. The major area almost looks like slough with an area of cellophane over it. I did not attempt any further debridement 02/28/2019. The patient has new areas superiorly and inferiorly to the major wound in the middle. The area in the middle has a nonviable surface over perhaps 60 to 70% however debridement causes uncontrolled bleeding here. He is on Plavix. We have been usin hydrogel and Prisma. 1/21; the patient has 2 superficial areas superiorly he has the major wound in the middle and a small area inferiorly in the middle of this abdominal wound. We switch to Iodoflex last time 2/4; the patient has 3 open areas. 2 of these are on the right part of this surgical wound on his abdomen and one more on the left central. The 2 on the right are hyper granulated I used silver nitrate on these. The area on the left appears to have a fibrinous surface. I attempted to debride this once he ended up in the ER. I am reluctant to do this again. We have been using Iodoflex 2/25; he has 3 open areas small ones  proximally and distally and then the larger one in the middle. This has nonviable debris over fibrinous debris over the surface. He is using Iodoflex 3/30; I follow this man monthly. He has wounds in the middle of the surgical scar in his abdomen. T oday 2 wounds which is an improvement. The major wound in the middle and one inferiorly and medially. 4/26; I follow this patient every month. This is a palliative setting. Wounds in the middle part of his surgical scar in the abdomen. He had significant amounts of denuded skin around the central area. This wipes off with Anasept and gauze. Really not any major improvement. We did get an abdominal binder for him at 1 point as usual patients do not wear these 07/16/19-Patient is back at 1 month, he has 3 small wounds in the [name] of his surgical scar on his abdomen, these appear to be slightly larger. He is not  using the abdominal binder as it is very uncomfortable 6/25; monthly follow-up. He is down to 2 open areas on the abdomen which appeared somewhat better today the larger area is superiorly and a small area inferiorly. There is debris on the surface of the superior 1 under illumination however very friable tissue. As opposed to what I said a month ago he is still using Iodoflex with some improvement in surface areas Objective Constitutional Sitting or standing Blood Pressure is within target range for patient.. Pulse regular and within target range for patient.[name] Kitchen Respirations regular, non-labored and within target range.. Temperature is normal and within the target range for the patient.[name] Kitchen Appears in no distress. Vitals Time Taken: 10:09 AM, Height: 71 in, Weight: 344 lbs, BMI: 48, Temperature: 98.5 F, Pulse: 92 bpm, Respiratory Rate: 20 breaths/min, Blood Pressure: 128/74 mmHg. Gastrointestinal (GI) Abdomen is soft.. General Notes: Wound exam; midline scar wounds x2. Surfaces still have some debris very friable tissue I used silver nitrate to cauterize this to see if this would allow some further epithelialization there is no evidence of surrounding infection Integumentary (Hair, Skin) Wound #8 status is Open. Original cause of wound was Surgical Injury. The wound is located on the Abdomen - midline. The wound measures 1.8cm length x 1.5cm width x 0.1cm depth; 2.121cm^2 area and 0.212cm^3 volume. There is Fat Layer (Subcutaneous Tissue) Exposed exposed. There is no tunneling or undermining noted. There is a medium amount of serous drainage noted. The wound margin is flat and intact. There is large (67-100%) red, pink granulation within the wound bed. There is no necrotic tissue within the wound bed. Wound #9 status is Open. Original cause of wound was Gradually Appeared. The wound is located on the Distal Abdomen - midline. The wound measures 1cm length x 1.2cm width x 0.1cm depth; 0.942cm^2  area and 0.094cm^3 volume. There is Fat Layer (Subcutaneous Tissue) Exposed exposed. There is no tunneling or undermining noted. There is a medium amount of serous drainage noted. The wound margin is flat and intact. There is large (67-100%) pink granulation within the wound bed. There is no necrotic tissue within the wound bed. Assessment Active Problems ICD-10 Disruption of external operation (surgical) wound, not elsewhere classified, subsequent encounter Unspecified open wound of abdominal wall, periumbilic region without penetration into peritoneal cavity, sequela Procedures Wound #8 Pre-procedure diagnosis of Wound #8 is an Open Surgical Wound located on the Abdomen - midline . An Chemical Cauterization procedure was performed by Ricard Dillon., MD. Post procedure Diagnosis Wound #8: Same as Pre-Procedure Wound #9 Pre-procedure diagnosis of Wound #  9 is an Open Surgical Wound located on the Distal Abdomen - midline . An Chemical Cauterization procedure was performed by Ricard Dillon., MD. Post procedure Diagnosis Wound #9: Same as Pre-Procedure Plan Follow-up Appointments: Return appointment in 1 month. - earlier in the day. Dressing Change Frequency: Change Dressing every other day. Wound Cleansing: May shower and wash wound with soap and water. - with dressing change Primary Wound Dressing: Wound #8 Abdomen - midline: Iodoflex Wound #9 Distal Abdomen - midline: Iodoflex Secondary Dressing: Dry Gauze ABD pad Other: - abdominal binder while up during the day. may remove at night. 1. Continue with Iodoflex as this seems to be really helping 2. Chemical cauterization as noted Electronic Signature(s) Signed: 08/16/2019 5:27:17 PM By: Linton Ham MD Entered By: Linton Ham on 08/16/2019 10:59:00 -------------------------------------------------------------------------------- SuperBill Details Patient Name: Date of Service: Larry Bishop MS, BENFEA RD L.  08/16/2019 Medical Record Number: [id number] Patient Account Number: [id number] Date of Birth/Sex: Treating RN: [birth year] (69 y.o. Larry [name] Primary Care Provider: Lin [name] Other Clinician: Referring Provider: Treating Provider/Extender: Larry [name], Larry [name] in Treatment: 32 Diagnosis Coding ICD-10 Codes Code Description T81.31XD Disruption of external operation (surgical) wound, not elsewhere classified, subsequent encounter S31.105S Unspecified open wound of abdominal wall, periumbilic region without penetration into peritoneal cavity, sequela Facility Procedures The patient participates with Medicare or their insurance follows the Medicare Facility Guidelines: CPT4 Code Description Modifier Quantity [id number] - CHEM CAUT GRANULATION TISS 2 ICD-10 Diagnosis Description S31.105S Unspecified open wound of  abdominal wall, periumbilic region without penetration into peritoneal cavity, sequela Physician Procedures : CPT4 Code Description Modifier [id number] - WC PHYS CHEM CAUT GRAN TISSUE ICD-10 Diagnosis Description M35.597C Unspecified open wound of abdominal wall, periumbilic region without penetration into peritoneal cavit Quantity: 2 y, sequela Electronic Signature(s) Signed: 08/16/2019 5:27:17 PM By: Linton Ham MD Entered By: Linton Ham on 08/16/2019 10:59:10

## 2019-09-03 NOTE — Progress Notes (Signed)
CARDIOLOGY OFFICE NOTE  Date:  09/11/2019    Larry [name] Date of Birth: 12-05-1950 Medical Record #[id number]  PCP:  Lin Landsman, MD  Cardiologist:  [place]   Chief Complaint  Patient presents with  . Follow-up    History of Present Illness: Larry [name] is a 69 y.o. male who presents today for a 4 month check. Seen for Dr. Rayann Heman.Primarily follows with me.  He has an ischemic CM with severe diffuse 3VD and prior stents to the RCA in 2010, chronic combined systolic and diastolic dysfunction, HTN, HLD, OSA on CPAP, morbid obesity and chronic massive lower extremity edema. Has had a history of long standing atrial tach that has been treated with beta blockerand CCB therapy. Found to have anemia due to colon cancer back in 2014 - he had surgery that was complicated by cardiac arrest with prolonged QT and subsequent torsades.   In 2016 he wanted to attempt having his colostomy reversed - Myoview was updated and was high risk - ended up being cathed by Dr. Burt Knack and had PCI to the LAD with DES x 2 and placed on DAPT.   He has had intermittent issues with elevation in his HR - he is typically not aware of this. He remains on both beta blocker and calcium channel blocker therapy. He had attempt at reversal of his colostomy in early 2018 - this was not successful and he had an anastomotic leak - respiratory failure and a prolonged stay at [facility].    I have seen him several times since. He has had prior admissions - medicines get cut back periodically and we inevitably put him back on due to the atrial tachycardia -  I have referred him back to Dr. Rayann Heman to review his medical therapy and to make sure we were all on track with the current plan of care. Has chronic and massive lower extremity edema - options are very limited and CCB will have to be continued.  Last seen in March - he has gotten more and more sedentary. Weight unfortunately continues to  climb. His swelling is chronic and massive. Continues to be followed in the wound clinic. Fortunately not much in the way of symptoms.   Comes in today. Here alone. He has been vaccinated for COVID. He is not getting out at all. Pretty much home bound. No activity whatsoever. Watches TV and looks out the window all day. His weight continues to climb. He is not dizzy. No chest pain. Says his breathing and swelling are all the same and he feels like he is ok.   Past Medical History:  Diagnosis Date  . Arthritis   . Atrial tachycardia ([place])    managed on beta blocker therapy  . Childhood asthma    "went away after I was 16"  . Chronic combined systolic and diastolic CHF, NYHA class 3 (HCC)    has diastolic heart failure grade 1; EF is 45 to 50% per echo 05/2011; EF 41% by Myoview 2016  . CKD (chronic kidney disease) stage 3, GFR 30-59 ml/min   . Colon cancer ([place]) 2015   MSI high; IHC loss of MLH1 and PMS2; BRAF negative; Negative methylation  . COPD (chronic obstructive pulmonary disease) ([place])   . Coronary artery disease   . Family history of ovarian cancer   . Hypercholesteremia   . Hypertension   . Noncompliance   . NSVT (nonsustained ventricular tachycardia) (HCC)    beta blocker  restarted  . Obesity   . OSA on CPAP    used nightly, pt does not know settings  . Peripheral arterial disease (HCC)    4.2 cm thoracic aortic aneurysm per chest ct 11-14-15 epic  . S/P colostomy ([place])    2014  . Thrombocytopenia ([place])   . Torsades de pointes ([place])    X 2 episodes during hospital visit 12'14"electrolyte imbalance"- "Shocked"    Past Surgical History:  Procedure Laterality Date  . COLON SURGERY    . COLONOSCOPY N/A 02/08/2013   Procedure: COLONOSCOPY;  Surgeon: [name], MD;  Location: [facility];  Service: Endoscopy;  Laterality: N/A;  . COLONOSCOPY WITH PROPOFOL N/A 05/09/2014   Procedure: COLONOSCOPY WITH PROPOFOL;  Surgeon: [name], MD;  Location[facility];   Service: Endoscopy;  Laterality: N/A;  . COLONOSCOPY WITH PROPOFOL N/A 01/08/2016   Procedure: COLONOSCOPY WITH PROPOFOL;  Surgeon: [name], MD;  [facility];  Service: Endoscopy;  Laterality: N/A;  . COLONOSCOPY WITH PROPOFOL N/A 12/28/2018   Procedure: COLONOSCOPY WITH PROPOFOL;  Surgeon: [name], MD;  [facility];  Service: Endoscopy;  Laterality: N/A;  . COLOSTOMY N/A 04/19/2016   Procedure: COLOSTOMY;  Surgeon: Judeth Horn, MD;  Location: [facility];  Service: General;  Laterality: N/A;  . COLOSTOMY REVERSAL  04/12/2016  . COLOSTOMY REVERSAL N/A 04/12/2016   Procedure: COLOSTOMY REVERSAL;  Surgeon: Judeth Horn, MD;  Location: [place];  Service: General;  Laterality: N/A;  . CORONARY ANGIOPLASTY WITH STENT PLACEMENT  11/12/2008; 06/11/2014   stent x 2 to RCA; stent x 2 to LAD  . CORONARY ANGIOPLASTY WITH STENT PLACEMENT  06/11/2014   m-LAD 3.5 x 16 mm Synergy DES, d-LAD  2.25 x 16 mm Synergy DES  . CYSTOSCOPY W/ URETERAL STENT PLACEMENT Left 06/30/2017   Procedure: CYSTOSCOPY WITH RETROGRADE PYELOGRAM/URETERAL STENT PLACEMENT;  Surgeon: Lucas Mallow, MD;  Location: [facility];  Service: Urology;  Laterality: Left;  . CYSTOSCOPY WITH LITHOLAPAXY N/A 08/02/2017   Procedure: CYSTOSCOPY WITH BLADDER STONE EXTRACTION;  Surgeon: Lucas Mallow, MD;  [facility];  Service: Urology;  Laterality: N/A;  . CYSTOSCOPY WITH RETROGRADE PYELOGRAM, URETEROSCOPY AND STENT PLACEMENT Left 08/02/2017   Procedure: CYSTOSCOPY WITH LEFT RETROGRADE PYELOGRAM, URETEROSCOPY AND STENT EXCHANGE;  Surgeon: Lucas Mallow, MD;  [facility];  Service: Urology;  Laterality: Left;  . ESOPHAGOGASTRODUODENOSCOPY N/A 02/08/2013   Procedure: ESOPHAGOGASTRODUODENOSCOPY (EGD);  Surgeon: [name], MD;  Location[facility];  Service: Endoscopy;  Laterality: N/A;  . FLEXIBLE SIGMOIDOSCOPY N/A 02/19/2016   Procedure: FLEXIBLE SIGMOIDOSCOPY;  Surgeon: [name], MD;  [facility];  Service: Endoscopy;  Laterality: N/A;  . HOLMIUM LASER APPLICATION Left 6/43/3295   Procedure: HOLMIUM LASER APPLICATION;  Surgeon: Lucas Mallow, MD;  [facility];  Service: Urology;  Laterality: Left;  . LAPAROTOMY N/A 02/12/2013   Procedure: EXPLORATORY LAPAROTOMY PARTIAL COLECTOMY WITH COLOSTOMY;  Surgeon: [name], MD;  Location: [facility];  Service: General;  Laterality: N/A;  . LAPAROTOMY N/A 02/18/2013   Procedure: EXPLORATORY LAPAROTOMY/Closure of Wound;  Surgeon: [name], MD;  Location: [facility];  Service: General;  Laterality: N/A;  . LAPAROTOMY N/A 04/19/2016   Procedure: EXPLORATORY LAPAROTOMY, REPAIR OF ANASTAMOTIC LEAK;  Surgeon: Judeth Horn, MD;  Location: [facility];  Service: General;  Laterality: N/A;  . LEFT HEART CATHETERIZATION WITH CORONARY ANGIOGRAM N/A 06/11/2014   Procedure: LEFT HEART CATHETERIZATION WITH CORONARY ANGIOGRAM;  Surgeon: Legrand Como  Burt Knack, MD; CFX calcified, 30-40 percent, RCA calcified, 40/50/40%, PDA diffuse disease, LAD 40/75/90% s/p DES 2   . POLYPECTOMY  12/28/2018   Procedure: POLYPECTOMY;  Surgeon: [name], MD;  [facility];  Service: Endoscopy;;     Medications: Current Meds  Medication Sig  . aspirin EC 81 MG tablet Take 1 tablet (81 mg total) by mouth daily.  [name] Kitchen atorvastatin (LIPITOR) 40 MG tablet TAKE 1 TABLET BY MOUTH EVERY DAY  . clopidogrel (PLAVIX) 75 MG tablet TAKE 1 TABLET (75 MG TOTAL) BY MOUTH DAILY WITH BREAKFAST.  [name] Kitchen diltiazem (CARDIZEM) 60 MG tablet TAKE 1.5 TABLETS BY MOUTH 3 TIMES DAILY.  . ferrous sulfate 325 (65 FE) MG tablet Take 1 tablet (325 mg total) by mouth daily with breakfast.  . furosemide (LASIX) 40 MG tablet TAKE 1.5 TABLETS (60 MG TOTAL) BY MOUTH DAILY.  [name] Kitchen levalbuterol (XOPENEX HFA) 45 MCG/ACT inhaler INHALE 2 PUFFS BY MOUTH EVERY 4 HOURS AS NEEDED FOR WHEEZING  . metoprolol tartrate (LOPRESSOR) 25 MG tablet TAKE 1.5 TABLETS (37.5 MG TOTAL) BY MOUTH 2 (TWO) TIMES DAILY.  .  Multiple Vitamin (MULTI-VITAMIN DAILY) TABS Take by mouth.  . nitroGLYCERIN (NITROSTAT) 0.4 MG SL tablet Place 0.4 mg under the tongue every 5 (five) minutes as needed for chest pain.  . simethicone (GAS-X EXTRA STRENGTH) 125 MG chewable tablet Chew 125 mg by mouth 3 (three) times daily.  [name] Kitchen spironolactone (ALDACTONE) 25 MG tablet TAKE 1/2 TABLET BY MOUTH EVERY DAY  . tamsulosin (FLOMAX) 0.4 MG CAPS capsule TAKE 1 CAPSULE BY MOUTH EVERY DAY     Allergies: No Known Allergies  Social History: The patient  reports that he quit smoking about 11 years ago. His smoking use included cigarettes. He has a 38.00 pack-year smoking history. He has never used smokeless tobacco. He reports current alcohol use. He reports that he does not use drugs.   Family History: The patient's family history includes Dementia in his mother; Diabetes in his father; Heart disease in his father; Hypertension in his father and mother; Leukemia in his maternal uncle; Liver cancer in his maternal grandmother; Parkinsonism in his mother; Prostate cancer in his maternal uncle.   Review of Systems: Please see the history of present illness.   All other systems are reviewed and negative.   Physical Exam: VS:  BP 116/66   Pulse (!) 103   Ht 5' 11"  (1.803 m)   Wt (!) 363 lb (164.7 kg)   SpO2 98%   BMI 50.63 kg/m  .  BMI Body mass index is 50.63 kg/m.  Wt Readings from Last 3 Encounters:  09/11/19 (!) 363 lb (164.7 kg)  06/20/19 (!) 353 lb (160.1 kg)  05/08/19 (!) 349 lb 12.8 oz (158.7 kg)    General: Pleasant. Morbidly obese. He is alert and in no acute distress. He is up 14 pounds since March.   Cardiac: Regular rate and rhythm. HR is about 100 by me. No murmurs, rubs, or gallops. He has chronic massive lower extremity edema.  Respiratory:  Lungs are clear to auscultation bilaterally with normal work of breathing.  GI: Obese.  MS: No deformity or atrophy. Gait and ROM intact.  Skin: Warm and dry. Color is normal.    Neuro:  Strength and sensation are intact and no gross focal deficits noted.  Psych: Alert, appropriate and with normal affect.   LABORATORY DATA:  EKG:  EKG is not ordered today.   Lab Results  Component Value Date   WBC  5.8 05/08/2019   HGB 12.7 (L) 05/08/2019   HCT 38.9 05/08/2019   PLT 139 (L) 05/08/2019   GLUCOSE 97 05/08/2019   CHOL 152 05/08/2019   TRIG 154 (H) 05/08/2019   HDL 58 05/08/2019   LDLCALC 68 05/08/2019   ALT 18 05/08/2019   AST 16 05/08/2019   NA 142 05/08/2019   K 4.2 05/08/2019   CL 104 05/08/2019   CREATININE 2.15 (H) 05/08/2019   BUN 23 05/08/2019   CO2 21 05/08/2019   TSH 1.630 09/11/2018   INR 1.31 06/30/2017   HGBA1C 5.9 (H) 05/14/2016     BNP (last 3 results) No results for input(s): BNP in the last 8760 hours.  ProBNP (last 3 results) No results for input(s): PROBNP in the last 8760 hours.   Other Studies Reviewed Today:  EchoStudy Conclusions2/2018  - Left ventricle: The cavity size was normal. Wall thickness was increased in a pattern of moderate LVH. Systolic function was normal. The estimated ejection fraction was in the range of 55% to 60%. Wall motion was normal; there were no regional wall motion abnormalities. The study is not technically sufficient to allow evaluation of LV diastolic function. - Aortic root: The aortic root was mildly dilated. - Mitral valve: Calcified annulus.  Impressions:  - Technically difficult; definity used; normal LV function; moderate LVH.   MyoviewStudy Highlights12/2017   Nuclear stress EF: 58%.  There was no ST segment deviation noted during stress.  There is a large defect of severe severity present in the basal inferior, mid inferior, apical septal, apical inferior and apex location. The defect is non-reversible. No ischemia noted. Tthis is consistent with diaphragmatic attenuation artifact vs. Scar.  This is a low risk study.  The left ventricular ejection  fraction is normal (55-65%).     Procedure: Left Heart Cath, [facility] Coronary Angiography, PTCA and stenting of the mid and distal LAD April 2016.  Coronary angiography: Left mainstem: The left mainstem is patent. The vessel divides into the LAD and left circumflex. There is no significant stenosis, but there is mild irregularity.  Left anterior descending (LAD): The LAD is moderately calcified. The vessel is severely diseased. The proximal LAD is patent with 30-40% stenosis after the second septal perforator. There is diffuse calcification. The diagonal branches are small. The mid LAD has an eccentric 75% stenosis at the origin of the second diagonal branch. Beyond that area the mid and distal LAD are diffusely diseased. There is another severe stenosis in the apical LAD of 90%.  Left circumflex (LCx): The left circumflex is calcified. The vessel is patent with mild irregularity. There are no high-grade stenoses identified. There are scattered 30-40% stenoses throughout the proximal and mid circumflex as well as the first OM branch.  Right coronary artery (RCA): The RCA is dominant. The vessel is heavily calcified. The stented segments in the mid and distal RCA are patent. The proximal vessel has 30-40% stenosis. The mid vessel has 30-40% stenosis. The distal vessel is patent with 50-60% stenosis involving the origin of the PDA. The PDA is diffusely diseased.  Left ventriculography: Deferred because of chronic kidney disease. By nuclear scan the LVEF is 41%.  PCI Note: Following the diagnostic procedure, the decision was made to proceed with PCI of the mid and apical LAD. The right coronary artery had patent stents in the left circumflex had nonobstructive disease. The LAD is a diffusely diseased vessel and the mid and distal vessel would be a poor surgical targets. I felt  that PCI was the best option in this patient with a high risk nuclear scan demonstrating anteroapical  and anterolateral ischemia. He will require colostomy reversal, so I planned on treating him with Synergy drug-eluting stents which had a biodegradable polymer and potentially can allow for earlier interruption of dual antiplatelet therapy. The patient was loaded with Plavix 600 mg. Weight-based bivalirudin was given for anticoagulation. Once a therapeutic ACT was achieved, a 6 [place] XB LAD 3.5 cm guide catheter was inserted. A cougar coronary guidewire was used to cross the lesion in the apical LAD. The lesion was predilated with a 2.5 x 12 mm balloon. The same balloon was used to dilate the mid lesion. The apical lesion was then stented with a 2.25 x 16 mm Synergy DES. The stent was postdilated with a 2.5 mm noncompliant balloon. The mid lesion was then stented with a 3.5 x 16 mm Synergy DES. That stent was postdilated with a 3.75 mm noncompliant balloon. Following PCI, there was 0% residual stenosis and TIMI-3 flow. Final angiography confirmed an excellent result. The patient tolerated the procedure well. There were no immediate procedural complications. A TR band was used for radial hemostasis. The patient was transferred to the post catheterization recovery area for further monitoring.  PCI Data: Lesion 1: Vessel - LAD/Segment - distal/apical Percent Stenosis (pre) 90 TIMI-flow 3 Stent 2.25 x 16 mm Synergy DES Percent Stenosis (post) 0 TIMI-flow (post) 3  Lesion 2: Vessel - LAD/Segment - mid Percent Stenosis (pre) 75 TIMI-flow 3 Stent 3.5 x 16 mm Synergy DES Percent Stenosis (post) 0 TIMI-flow (post) 3  Estimated Blood Loss: Minimal  Final Conclusions:  1. Two-vessel coronary artery disease with continued patency of the stented segments in the right coronary artery and severe stenoses of the mid and apical LAD 2. Mild diffuse nonobstructive left circumflex stenosis 3. Successful PCI of the LAD using 2 drug-eluting stents   Recommendations:  Dual antiplatelet therapy for  at least 6 months. Would be reasonable to consider interruption of aspirin and Plavix at 6 months for colostomy reversal.  [name] MD, [facility] 06/11/2014, 10:06 AM   Assessment / Plan:  1. Atrial tach - chronic finding for many years - he has never been symptomatic. He is managed medically with limited options otherwise, not a candidate for EP procedures or AAD therapy.   2. Known CAD - prior PCI to the RCA and PCI to the LAD in 2016 with DES x 2 - remains on chronic DAPT. No worrisome symptoms noted. Lab today. Could change to monotherapy with Plavix.   3. Morbid obesity - weight continues to climb - this is going to be the crux of many issues going forward - he is way too sedentary - he knows this - very sad situation that unfortunately, I do not see changing.   4. Prior colon cancer - has ileostomy after failed attempt at reversal that led to long hospitalization and stay at [facility].   5. Chronic combined systolic and diastolic HF - EF now normal - his edema is chronic - unfortunately not much to add.   6. CKD - rechecking his lab today. I think we just have to accept this.   7. HTN - BP is fine on his current regimen. No changes made today.   8. HLD - on statin   9. Prior GU procedures - I have typically refilled his Flomax for him - he no longer sees GU and is really not even seeing PCP. Want to  avoid urinary retention that could lead to recurrent UTI and potential for sepsis - this would be life threatening for him.   Current medicines are reviewed with the patient today.  The patient does not have concerns regarding medicines other than what has been noted above.  The following changes have been made:  See above.  Labs/ tests ordered today include:    Orders Placed This Encounter  Procedures  . Basic metabolic panel  . CBC     Disposition:   FU with me in 4 months.   Patient is agreeable to this plan and will call if any problems develop in the interim.    SignedTruitt Merle, NP  09/11/2019 10:15 AM  [facility] [address] [place], [place]  [address] Phone: [phone] Fax: [phone]

## 2019-09-11 ENCOUNTER — Other Ambulatory Visit: Payer: Self-pay

## 2019-09-11 ENCOUNTER — Encounter: Payer: Self-pay | Admitting: Nurse Practitioner

## 2019-09-11 ENCOUNTER — Ambulatory Visit (INDEPENDENT_AMBULATORY_CARE_PROVIDER_SITE_OTHER): Payer: Medicare Other | Admitting: Nurse Practitioner

## 2019-09-11 VITALS — BP 116/66 | HR 103 | Ht 71.0 in | Wt 363.0 lb

## 2019-09-11 DIAGNOSIS — I5022 Chronic systolic (congestive) heart failure: Secondary | ICD-10-CM | POA: Diagnosis not present

## 2019-09-11 DIAGNOSIS — I259 Chronic ischemic heart disease, unspecified: Secondary | ICD-10-CM

## 2019-09-11 DIAGNOSIS — I471 Supraventricular tachycardia: Secondary | ICD-10-CM

## 2019-09-11 LAB — BASIC METABOLIC PANEL
BUN/Creatinine Ratio: 9 — ABNORMAL LOW (ref 10–24)
BUN: 18 mg/dL (ref 8–27)
CO2: 19 mmol/L — ABNORMAL LOW (ref 20–29)
Calcium: 10.1 mg/dL (ref 8.6–10.2)
Chloride: 103 mmol/L (ref 96–106)
Creatinine, Ser: 2.09 mg/dL — ABNORMAL HIGH (ref 0.76–1.27)
GFR calc Af Amer: 37 mL/min/{1.73_m2} — ABNORMAL LOW (ref >59)
GFR calc non Af Amer: 32 mL/min/{1.73_m2} — ABNORMAL LOW (ref >59)
Glucose: 88 mg/dL (ref 65–99)
Potassium: 4 mmol/L (ref 3.5–5.2)
Sodium: 141 mmol/L (ref 134–144)

## 2019-09-11 LAB — CBC
Hematocrit: 39.9 % (ref 37.5–51.0)
Hemoglobin: 12.9 g/dL — ABNORMAL LOW (ref 13.0–17.7)
MCH: 28.8 pg (ref 26.6–33.0)
MCHC: 32.3 g/dL (ref 31.5–35.7)
MCV: 89 fL (ref 79–97)
Platelets: 135 10*3/uL — ABNORMAL LOW (ref 150–450)
RBC: 4.48 x10E6/uL (ref 4.14–5.80)
RDW: 14 % (ref 11.6–15.4)
WBC: 5.4 10*3/uL (ref 3.4–10.8)

## 2019-09-11 NOTE — Patient Instructions (Addendum)
After Visit Summary:  We will be checking the following labs today - BMET and CBC   Medication Instructions:    Continue with your current medicines.    If you need a refill on your cardiac medications before your next appointment, please call your pharmacy.     Testing/Procedures To Be Arranged:  N/A  Follow-Up:   See me in 4 months.     At [facility], you and your health needs are our priority.  As part of our continuing mission to provide you with exceptional heart care, we have created designated Provider Care Teams.  These Care Teams include your primary Cardiologist (physician) and Advanced Practice Providers (APPs -  Physician Assistants and Nurse Practitioners) who all work together to provide you with the care you need, when you need it.  Special Instructions:  . Stay safe, wash your hands for at least 20 seconds and wear a mask when needed.  . It was good to talk with you today.    Call the [place] office at [phone] if you have any questions, problems or concerns.

## 2019-09-13 ENCOUNTER — Encounter (HOSPITAL_BASED_OUTPATIENT_CLINIC_OR_DEPARTMENT_OTHER): Payer: Medicare Other | Attending: Internal Medicine | Admitting: Internal Medicine

## 2019-09-13 DIAGNOSIS — I872 Venous insufficiency (chronic) (peripheral): Secondary | ICD-10-CM | POA: Diagnosis not present

## 2019-09-13 DIAGNOSIS — Y838 Other surgical procedures as the cause of abnormal reaction of the patient, or of later complication, without mention of misadventure at the time of the procedure: Secondary | ICD-10-CM | POA: Diagnosis not present

## 2019-09-13 DIAGNOSIS — J449 Chronic obstructive pulmonary disease, unspecified: Secondary | ICD-10-CM | POA: Diagnosis not present

## 2019-09-13 DIAGNOSIS — I739 Peripheral vascular disease, unspecified: Secondary | ICD-10-CM | POA: Diagnosis not present

## 2019-09-13 DIAGNOSIS — T8131XA Disruption of external operation (surgical) wound, not elsewhere classified, initial encounter: Secondary | ICD-10-CM | POA: Insufficient documentation

## 2019-09-13 DIAGNOSIS — I5022 Chronic systolic (congestive) heart failure: Secondary | ICD-10-CM | POA: Insufficient documentation

## 2019-09-13 DIAGNOSIS — Z955 Presence of coronary angioplasty implant and graft: Secondary | ICD-10-CM | POA: Diagnosis not present

## 2019-09-13 DIAGNOSIS — G4733 Obstructive sleep apnea (adult) (pediatric): Secondary | ICD-10-CM | POA: Diagnosis not present

## 2019-09-13 DIAGNOSIS — Z7982 Long term (current) use of aspirin: Secondary | ICD-10-CM | POA: Diagnosis not present

## 2019-09-13 DIAGNOSIS — Z86711 Personal history of pulmonary embolism: Secondary | ICD-10-CM | POA: Insufficient documentation

## 2019-09-13 DIAGNOSIS — I130 Hypertensive heart and chronic kidney disease with heart failure and stage 1 through stage 4 chronic kidney disease, or unspecified chronic kidney disease: Secondary | ICD-10-CM | POA: Diagnosis not present

## 2019-09-13 DIAGNOSIS — I2510 Atherosclerotic heart disease of native coronary artery without angina pectoris: Secondary | ICD-10-CM | POA: Diagnosis not present

## 2019-09-13 DIAGNOSIS — Z7902 Long term (current) use of antithrombotics/antiplatelets: Secondary | ICD-10-CM | POA: Insufficient documentation

## 2019-09-13 DIAGNOSIS — Z6841 Body Mass Index (BMI) 40.0 and over, adult: Secondary | ICD-10-CM | POA: Insufficient documentation

## 2019-09-13 DIAGNOSIS — N183 Chronic kidney disease, stage 3 unspecified: Secondary | ICD-10-CM | POA: Diagnosis not present

## 2019-09-13 DIAGNOSIS — Z85038 Personal history of other malignant neoplasm of large intestine: Secondary | ICD-10-CM | POA: Insufficient documentation

## 2019-09-13 NOTE — Progress Notes (Signed)
[name], Korbyn L. ([id number]) Visit Report for 09/13/2019 HPI Details Patient Name: Date of Service: [facility] MS, [facility] RD L. 09/13/2019 10:15 A M Medical Record Number: [id number] Patient Account Number: [id number] Date of Birth/Sex: Treating RN: [birth year] (69 y.o. [name] Primary Care Provider: Lin Landsman Other Clinician: Referring Provider: Treating Provider/Extender: Shelly Rubenstein, [name] in Treatment: 36 History of Present Illness Location: Patient presents with a wound to left lower leg. Quality: Patient reports No Pain. Severity: Patient states wound(s) are getting worse. Duration: Patient has had the wound for < 2 weeks prior to presenting for treatment Modifying Factors: pvd HPI Description: pt smoked until 5 years ago. cancer surgery 1 year ago. has ruq stoma. cv studies last year revealed non-compressible vessels. recent pe by pcp yielded some heart abnormality and he is scheduled for stress test. 5/24 2 new wounds on rt leg. left side healed 08/12/14; several small wounds on the right lateral leg and one wound on the left leg which is new. The edema control here does not look adequate. There is also some degree of maceration around the wounds on the right lateral leg READMISSION 09/14/2018 This is a patient we had in this clinic in 2013 and then again in 2016 with wounds on his right lateral leg secondary to chronic venous insufficiency. He was cared for I believe in 2016 by Dr. Lindon Romp. The patient is here for review of 6 small wounds within a large surgical area on his abdomen. The patient had surgery for a malignant neoplasm of the sigmoid colon with a colostomy. I cannot see the date of the exact surgery in epic. In February 2018 he had an attempted colostomy reversal however he had a microperforation I believe at the anastomosis. He required a repeat laparotomy. He had an ileostomy raised at that time. I believe the surgical wound at that time  was left to close via secondary intention. The patient is unaware whether he had a wound VAC on this. In any case this almost is completely closed except he has been left with 6 small eraser head sized areas on the abdomen that have not healed. I am not exactly sure how he was how he is been dressing this. He has been followed with frequent visits in general surgery dating back at least over a year. Past medical history includes chronic systolic heart failure, stage III chronic renal failure. Malignant neoplasm of the sigmoid colon, morbid obesity. 7/31; not a lot of change 6 small wounds within a large surgical area on his abdomen. None of these are hyper granulated as opposed to last week. None of them have exposed mesh or surrounding infection. He has been using [facility] 8/14- He comes after 2 weeks, he has only 2 open wounds that I can tell this time, he has been using Hydrofera Blue 8/28; 2-week follow-up. Very superficial areas on the abdominal surgical site scar. He is using Hydrofera Blue and this seems to be doing a good job. He has not made it down to the medical supply store to see if he can get an abdominal binder or something to keep the tension off this wound area. READMISSION 01/04/2019 This is a 69 year old man we had in the clinic from July through August 2020. He had wounds in the midline abdominal scar. [Please see history from my note of 09/14/2018}. When he was here last time we used Hydrofera Blue. Things seem to be improving. He did not discharge in a healed state his  last visit here was on 8/28. He states he continued to use the [facility] and at 1 point this was very close to healing but more recently it is reopened. The wounds are basically in the same state as last time superficial friable easily bleeding wounds. Some hyper granulation. He has an abdominal binder that he got at [facility] however he says it is too tight for him to get on. Past medical history is  reviewed; he has coronary artery disease, stage III chronic renal failure heart failure with reduced ejection fraction, COPD, hypertension, obstructive sleep apnea. He was hospitalized since he was here last time for a severe pharyngitis. 11/19; the patient was in the ER on 11/16; this was for oozing from his abdominal wound. He was cauterized with quick clot gauze and epi and silver nitrate. This was felt to be venous oozing. He has very friable wounds in this area. He is using his abdominal binder. 12/3; the patient was in the ER with bleeding from the lower part of his incision in the abdomen on 11/28. Had a stitch placed to contain the bleeding area. This was just underneath his major wound. He saw general surgery yesterday and apparently we are to remove the stitch in 2 weeks. The patient is on Plavix and aspirin for coronary artery disease with stents. His bleeding was felt to be capillary. 12/17; I removed the stitch from the bleeding area that was placed last time. This came out with some difficulty. The major area almost looks like slough with an area of cellophane over it. I did not attempt any further debridement 02/28/2019. The patient has new areas superiorly and inferiorly to the major wound in the middle. The area in the middle has a nonviable surface over perhaps 60 to 70% however debridement causes uncontrolled bleeding here. He is on Plavix. We have been usin hydrogel and Prisma. 1/21; the patient has 2 superficial areas superiorly he has the major wound in the middle and a small area inferiorly in the middle of this abdominal wound. We switch to Iodoflex last time 2/4; the patient has 3 open areas. 2 of these are on the right part of this surgical wound on his abdomen and one more on the left central. The 2 on the right are hyper granulated I used silver nitrate on these. The area on the left appears to have a fibrinous surface. I attempted to debride this once he ended up in the ER.  I am reluctant to do this again. We have been using Iodoflex 2/25; he has 3 open areas small ones proximally and distally and then the larger one in the middle. This has nonviable debris over fibrinous debris over the surface. He is using Iodoflex 3/30; I follow this man monthly. He has wounds in the middle of the surgical scar in his abdomen. T oday 2 wounds which is an improvement. The major wound in the middle and one inferiorly and medially. 4/26; I follow this patient every month. This is a palliative setting. Wounds in the middle part of his surgical scar in the abdomen. He had significant amounts of denuded skin around the central area. This wipes off with Anasept and gauze. Really not any major improvement. We did get an abdominal binder for him at 1 point as usual patients do not wear these 07/16/19-Patient is back at 1 month, he has 3 small wounds in the center of his surgical scar on his abdomen, these appear to be slightly larger.  He is not using the abdominal binder as it is very uncomfortable 6/25; monthly follow-up. He is down to 2 open areas on the abdomen which appeared somewhat better today the larger area is superiorly and a small area inferiorly. There is debris on the surface of the superior 1 under illumination however very friable tissue. As opposed to what I said a month ago he is still using Iodoflex with some improvement in surface areas 7/23. Monthly follow-up. He has a central area that is still open and this abdominal scar. Not able to get on his abdominal binder that we got some time ago. He is simply growing out of it. One of her nurses suggested Montgomery straps he is going to try those. Electronic Signature(s) Signed: 09/13/2019 5:20:32 PM By: Linton Ham MD Entered By: Linton Ham on 09/13/2019 11:31:44 -------------------------------------------------------------------------------- Chemical Cauterization Details Patient Name: Date of Service: [facility] MS,  BENFE[facility] RD L. 09/13/2019 10:15 A M Medical Record Number: [id number] Patient Account Number: [id number] Date of Birth/Sex: Treating RN: 12/04/1950 (69 y.o. [name] Primary Care Provider: Lin Landsman Other Clinician: Referring Provider: Treating Provider/Extender: Lavella Lemons Weeks in Treatment: 36 Procedure Performed for: Wound #9 Distal Abdomen - midline Performed By: Physician Ricard Dillon., MD Post Procedure Diagnosis Same as Pre-procedure Electronic Signature(s) Signed: 09/13/2019 5:20:32 PM By: Linton Ham MD Signed: 09/13/2019 5:51:32 PM By: Kela Millin Entered By: Kela Millin on 09/13/2019 11:22:40 -------------------------------------------------------------------------------- Physical Exam Details Patient Name: Date of Service: [facility] MS, BENFEA RD L. 09/13/2019 10:15 A M Medical Record Number: [id number] Patient Account Number: [id number] Date of Birth/Sex: Treating RN: Dec 15, 1950 (69 y.o. [name] Primary Care Provider: Lin Landsman Other Clinician: Referring Provider: Treating Provider/Extender: Shelly Rubenstein, [name] in Treatment: 36 Constitutional Sitting or standing Blood Pressure is within target range for patient.. Pulse regular and within target range for patient.[name] Kitchen Respirations regular, non-labored and within target range.. Temperature is normal and within the target range for the patient.[name] Kitchen Appears in no distress. Notes Wound exam; midline scar. Central wound. Still the same debris over the surface very friable tissue I used silver nitrate on this. I think there is still too much stretch over the wound surface from his abdominal distention. This does not look too bad when he is lying down but I am sure when he is standing this would be full force over this wound area Electronic Signature(s) Signed: 09/13/2019 5:20:32 PM By: Linton Ham MD Entered By: Linton Ham on 09/13/2019  11:33:07 -------------------------------------------------------------------------------- Physician Orders Details Patient Name: Date of Service: [facility] MS, BENFEA RD L. 09/13/2019 10:15 A M Medical Record Number: [id number] Patient Account Number: [id number] Date of Birth/Sex: Treating RN: 12/21/1950 (69 y.o. [name] Primary Care Provider: Lin Landsman Other Clinician: Referring Provider: Treating Provider/Extender: Shelly Rubenstein, [name] in Treatment: 2 Verbal / Phone Orders: No Diagnosis Coding ICD-10 Coding Code Description T81.31XD Disruption of external operation (surgical) wound, not elsewhere classified, subsequent encounter S31.105S Unspecified open wound of abdominal wall, periumbilic region without penetration into peritoneal cavity, sequela Follow-up Appointments Other: - six weeks Dressing Change Frequency Change Dressing every other day. Wound Cleansing May shower and wash wound with soap and water. - with dressing change Primary Wound Dressing Wound #9 Distal Abdomen - midline Iodoflex Secondary Dressing Dry Gauze ABD pad Other: - abdominal binder while up during the day. may remove at night. Electronic Signature(s) Signed: 09/13/2019 5:20:32 PM By: Linton Ham MD Signed: 09/13/2019 5:51:32 PM By: Kela Millin Entered By:  Kela Millin on 09/13/2019 11:23:09 -------------------------------------------------------------------------------- Problem List Details Patient Name: Date of Service: [facility] MS, [facility] RD L. 09/13/2019 10:15 A M Medical Record Number: [id number] Patient Account Number: [id number] Date of Birth/Sex: Treating RN: May 17, 1950 (69 y.o. [name] Primary Care Provider: Lin Landsman Other Clinician: Referring Provider: Treating Provider/Extender: Shelly Rubenstein, [name] in Treatment: 36 Active Problems ICD-10 Encounter Code Description Active Date MDM Diagnosis T81.31XD  Disruption of external operation (surgical) wound, not elsewhere classified, 01/04/2019 No Yes subsequent encounter S31.105S Unspecified open wound of abdominal wall, periumbilic region without 25/85/2778 No Yes penetration into peritoneal cavity, sequela Inactive Problems Resolved Problems Electronic Signature(s) Signed: 09/13/2019 5:20:32 PM By: Linton Ham MD Entered By: Linton Ham on 09/13/2019 11:30:21 -------------------------------------------------------------------------------- Progress Note Details Patient Name: Date of Service: [facility], BENFEA RD L. 09/13/2019 10:15 A M Medical Record Number: [id number] Patient Account Number: [id number] Date of Birth/Sex: Treating RN: 1950/03/27 (69 y.o. [name] Primary Care Provider: Lin Landsman Other Clinician: Referring Provider: Treating Provider/Extender: Shelly Rubenstein, [name] in Treatment: 36 Subjective History of Present Illness (HPI) The following HPI elements were documented for the patient's wound: Location: Patient presents with a wound to left lower leg. Quality: Patient reports No Pain. Severity: Patient states wound(s) are getting worse. Duration: Patient has had the wound for < 2 weeks prior to presenting for treatment Modifying Factors: pvd pt smoked until 5 years ago. cancer surgery 1 year ago. has ruq stoma. cv studies last year revealed non-compressible vessels. recent pe by pcp yielded some heart abnormality and he is scheduled for stress test. 5/24 2 new wounds on rt leg. left side healed 08/12/14; several small wounds on the right lateral leg and one wound on the left leg which is new. The edema control here does not look adequate. There is also some degree of maceration around the wounds on the right lateral leg READMISSION 09/14/2018 This is a patient we had in this clinic in 2013 and then again in 2016 with wounds on his right lateral leg secondary to chronic venous  insufficiency. He was cared for I believe in 2016 by Dr. Lindon Romp. The patient is here for review of 6 small wounds within a large surgical area on his abdomen. The patient had surgery for a malignant neoplasm of the sigmoid colon with a colostomy. I cannot see the date of the exact surgery in epic. In February 2018 he had an attempted colostomy reversal however he had a microperforation I believe at the anastomosis. He required a repeat laparotomy. He had an ileostomy raised at that time. I believe the surgical wound at that time was left to close via secondary intention. The patient is unaware whether he had a wound VAC on this. In any case this almost is completely closed except he has been left with 6 small eraser head sized areas on the abdomen that have not healed. I am not exactly sure how he was how he is been dressing this. He has been followed with frequent visits in general surgery dating back at least over a year. Past medical history includes chronic systolic heart failure, stage III chronic renal failure. Malignant neoplasm of the sigmoid colon, morbid obesity. 7/31; not a lot of change 6 small wounds within a large surgical area on his abdomen. None of these are hyper granulated as opposed to last week. None of them have exposed mesh or surrounding infection. He has been using [facility] 8/14- He comes after 2 weeks, he  has only 2 open wounds that I can tell this time, he has been using Hydrofera Blue 8/28; 2-week follow-up. Very superficial areas on the abdominal surgical site scar. He is using Hydrofera Blue and this seems to be doing a good job. He has not made it down to the medical supply store to see if he can get an abdominal binder or something to keep the tension off this wound area. READMISSION 01/04/2019 This is a 69 year old man we had in the clinic from July through August 2020. He had wounds in the midline abdominal scar. [Please see history from my note of 09/14/2018}.  When he was here last time we used Hydrofera Blue. Things seem to be improving. He did not discharge in a healed state his last visit here was on 8/28. He states he continued to use the [facility] and at 1 point this was very close to healing but more recently it is reopened. The wounds are basically in the same state as last time superficial friable easily bleeding wounds. Some hyper granulation. He has an abdominal binder that he got at [facility] however he says it is too tight for him to get on. Past medical history is reviewed; he has coronary artery disease, stage III chronic renal failure heart failure with reduced ejection fraction, COPD, hypertension, obstructive sleep apnea. He was hospitalized since he was here last time for a severe pharyngitis. 11/19; the patient was in the ER on 11/16; this was for oozing from his abdominal wound. He was cauterized with quick clot gauze and epi and silver nitrate. This was felt to be venous oozing. He has very friable wounds in this area. He is using his abdominal binder. 12/3; the patient was in the ER with bleeding from the lower part of his incision in the abdomen on 11/28. Had a stitch placed to contain the bleeding area. This was just underneath his major wound. He saw general surgery yesterday and apparently we are to remove the stitch in 2 weeks. The patient is on Plavix and aspirin for coronary artery disease with stents. His bleeding was felt to be capillary. 12/17; I removed the stitch from the bleeding area that was placed last time. This came out with some difficulty. The major area almost looks like slough with an area of cellophane over it. I did not attempt any further debridement 02/28/2019. The patient has new areas superiorly and inferiorly to the major wound in the middle. The area in the middle has a nonviable surface over perhaps 60 to 70% however debridement causes uncontrolled bleeding here. He is on Plavix. We have been usin  hydrogel and Prisma. 1/21; the patient has 2 superficial areas superiorly he has the major wound in the middle and a small area inferiorly in the middle of this abdominal wound. We switch to Iodoflex last time 2/4; the patient has 3 open areas. 2 of these are on the right part of this surgical wound on his abdomen and one more on the left central. The 2 on the right are hyper granulated I used silver nitrate on these. The area on the left appears to have a fibrinous surface. I attempted to debride this once he ended up in the ER. I am reluctant to do this again. We have been using Iodoflex 2/25; he has 3 open areas small ones proximally and distally and then the larger one in the middle. This has nonviable debris over fibrinous debris over the surface. He is using Iodoflex  3/30; I follow this man monthly. He has wounds in the middle of the surgical scar in his abdomen. T oday 2 wounds which is an improvement. The major wound in the middle and one inferiorly and medially. 4/26; I follow this patient every month. This is a palliative setting. Wounds in the middle part of his surgical scar in the abdomen. He had significant amounts of denuded skin around the central area. This wipes off with Anasept and gauze. Really not any major improvement. We did get an abdominal binder for him at 1 point as usual patients do not wear these 07/16/19-Patient is back at 1 month, he has 3 small wounds in the center of his surgical scar on his abdomen, these appear to be slightly larger. He is not using the abdominal binder as it is very uncomfortable 6/25; monthly follow-up. He is down to 2 open areas on the abdomen which appeared somewhat better today the larger area is superiorly and a small area inferiorly. There is debris on the surface of the superior 1 under illumination however very friable tissue. As opposed to what I said a month ago he is still using Iodoflex with some improvement in surface areas 7/23.  Monthly follow-up. He has a central area that is still open and this abdominal scar. Not able to get on his abdominal binder that we got some time ago. He is simply growing out of it. One of her nurses suggested Montgomery straps he is going to try those. Objective Constitutional Sitting or standing Blood Pressure is within target range for patient.. Pulse regular and within target range for patient.[name] Kitchen Respirations regular, non-labored and within target range.. Temperature is normal and within the target range for the patient.[name] Kitchen Appears in no distress. Vitals Time Taken: 10:40 AM, Height: 71 in, Weight: 344 lbs, BMI: 48, Temperature: 97.9 F, Pulse: 93 bpm, Respiratory Rate: 20 breaths/min, Blood Pressure: 118/74 mmHg. General Notes: Wound exam; midline scar. Central wound. Still the same debris over the surface very friable tissue I used silver nitrate on this. I think there is still too much stretch over the wound surface from his abdominal distention. This does not look too bad when he is lying down but I am sure when he is standing this would be full force over this wound area Integumentary (Hair, Skin) Wound #8 status is Open. Original cause of wound was Surgical Injury. The wound is located on the Abdomen - midline. The wound measures 0cm length x 0cm width x 0cm depth; 0cm^2 area and 0cm^3 volume. There is Fat Layer (Subcutaneous Tissue) Exposed exposed. There is no tunneling or undermining noted. There is a none present amount of drainage noted. The wound margin is flat and intact. There is no granulation within the wound bed. There is no necrotic tissue within the wound bed. Wound #9 status is Open. Original cause of wound was Gradually Appeared. The wound is located on the Distal Abdomen - midline. The wound measures 4cm length x 2cm width x 0.1cm depth; 6.283cm^2 area and 0.628cm^3 volume. There is Fat Layer (Subcutaneous Tissue) Exposed exposed. There is no tunneling or undermining  noted. There is a medium amount of serous drainage noted. The wound margin is flat and intact. There is large (67-100%) pink granulation within the wound bed. There is no necrotic tissue within the wound bed. Assessment Active Problems ICD-10 Disruption of external operation (surgical) wound, not elsewhere classified, subsequent encounter Unspecified open wound of abdominal wall, periumbilic region without penetration into peritoneal cavity,  sequela Procedures Wound #9 Pre-procedure diagnosis of Wound #9 is an Open Surgical Wound located on the Distal Abdomen - midline . An Chemical Cauterization procedure was performed by Ricard Dillon., MD. Post procedure Diagnosis Wound #9: Same as Pre-Procedure Plan Follow-up Appointments: Other: - six weeks Dressing Change Frequency: Change Dressing every other day. Wound Cleansing: May shower and wash wound with soap and water. - with dressing change Primary Wound Dressing: Wound #9 Distal Abdomen - midline: Iodoflex Secondary Dressing: Dry Gauze ABD pad Other: - abdominal binder while up during the day. may remove at night. 1. I continued with the Iodoflex 2. I agree with any form of compression that might relieve the torsion over this wound area i.e. Montgomery straps. He is growing out of the original abdominal binder which I do not think he used much anyway Electronic Signature(s) Signed: 09/13/2019 5:20:32 PM By: Linton Ham MD Entered By: Linton Ham on 09/13/2019 11:37:21 -------------------------------------------------------------------------------- SuperBill Details Patient Name: Date of Service: Lyman Bishop MS, BENFEA RD L. 09/13/2019 Medical Record Number: [id number] Patient Account Number: [id number] Date of Birth/Sex: Treating RN: 1950/11/03 (69 y.o. [name] Primary Care Provider: Lin Landsman Other Clinician: Referring Provider: Treating Provider/Extender: Shelly Rubenstein, [name] in  Treatment: 36 Diagnosis Coding ICD-10 Codes Code Description T81.31XD Disruption of external operation (surgical) wound, not elsewhere classified, subsequent encounter S31.105S Unspecified open wound of abdominal wall, periumbilic region without penetration into peritoneal cavity, sequela Facility Procedures The patient participates with Medicare or their insurance follows the Medicare Facility Guidelines: CPT4 Code Description Modifier Quantity [id number] - CHEM CAUT GRANULATION TISS 1 ICD-10 Diagnosis Description H88.502D Unspecified open wound of  abdominal wall, periumbilic region without penetration into peritoneal cavity, sequela Physician Procedures : CPT4 Code Description Modifier [id number] - WC PHYS CHEM CAUT GRAN TISSUE ICD-10 Diagnosis Description C94.709G Unspecified open wound of abdominal wall, periumbilic region without penetration into peritoneal cavit Quantity: 1 y, sequela Electronic Signature(s) Signed: 09/13/2019 5:20:32 PM By: Linton Ham MD Entered By: Linton Ham on 09/13/2019 11:37:36

## 2019-09-17 ENCOUNTER — Encounter: Payer: Self-pay | Admitting: Internal Medicine

## 2019-09-19 ENCOUNTER — Other Ambulatory Visit: Payer: Self-pay

## 2019-09-19 ENCOUNTER — Encounter: Payer: Self-pay | Admitting: Internal Medicine

## 2019-09-19 ENCOUNTER — Ambulatory Visit (INDEPENDENT_AMBULATORY_CARE_PROVIDER_SITE_OTHER): Payer: Medicare Other | Admitting: Internal Medicine

## 2019-09-19 VITALS — BP 128/86 | HR 111 | Temp 98.4°F | Ht 71.0 in | Wt 362.6 lb

## 2019-09-19 DIAGNOSIS — G4733 Obstructive sleep apnea (adult) (pediatric): Secondary | ICD-10-CM

## 2019-09-19 DIAGNOSIS — J449 Chronic obstructive pulmonary disease, unspecified: Secondary | ICD-10-CM

## 2019-09-19 DIAGNOSIS — I259 Chronic ischemic heart disease, unspecified: Secondary | ICD-10-CM | POA: Diagnosis not present

## 2019-09-19 NOTE — Patient Instructions (Signed)
Order- DME [facility]- continue CPAP auto 10-20, mask of choice, humidifier, supplies, AirView/ card  Please cal [place] if we can help

## 2019-09-19 NOTE — Progress Notes (Signed)
HPI male former smoker followed for OSA complicated by COPD, ischemic cardiomyopathy, hypertension, history CHF, colon cancer, Obesity NPSG- 12/04/13- AHI 24.6/ hr, CPAP to 14, weight  348 lbs  ----------------------------------------------------------------------------   06/20/19- 69 year old male former smoker followed for OSA complicated by COPD, HTN, CAD, ischemic cardiomyopathy, dCHF, hypertension , AFib , colon cancer/ colostomy, Obesity CPAP auto 10-20/[employer] xopenex hfa, Download- unavailable. He reports using CPAP every night, working ok but this is his original machine. Body weight today- 353 lbs ------pt needs new cpap equip. uses equip every night.  Epworth score 3 Had 2 Phizer Covax He denies any current cardiopulmonary concerns at this visit. Still follows with cardiology.  09/19/19- 69 year old male former smoker followed for OSA complicated by COPD, HTN, CAD, ischemic cardiomyopathy, dCHF, hypertension , AFib , colon cancer/ colostomy, Obesity, Chronic Venous Insufficiency/ Stasis Ulcers,,  CPAP auto 10-20/[facility] xopenex hfa, Replaced old CPAP machine since last here. Download compliance 100%, AHI 1.8/ hr Body weight today 362 lbs Had 2 Phizer Covax Rarely needs rescue inhaler  ROS-see HPI  + = positive Constitutional:   No-   weight loss, night sweats, fevers, chills, fatigue, lassitude. HEENT:   No-  headaches, difficulty swallowing, tooth/dental problems, sore throat,       No-  sneezing, itching, ear ache, nasal congestion, post nasal drip,  CV:  No-   chest pain, orthopnea, PND, swelling in lower extremities, anasarca,                                                     dizziness, palpitations Resp: +  shortness of breath with exertion or at rest.              No-   productive cough,  No non-productive cough,  No- coughing up of blood.              No-   change in color of mucus.  No- wheezing.   Skin: No-   rash or lesions. GI:  No-   heartburn, indigestion,  abdominal pain, nausea, vomiting,  GU:  MS:  No-   joint pain or swelling.   Neuro-     nothing unusual Psych:  No- change in mood or affect. No depression or anxiety.  No memory loss.  OBJ- Physical Exam  big man, + obese General- Alert, Oriented, Affect-appropriate, Distress- none acute Skin- rash-none, lesions- none, excoriation- none Lymphadenopathy- none Head- atraumatic            Eyes- Gross vision intact, PERRLA, conjunctivae and secretions clear            Ears- Hearing, canals-normal            Nose- Clear, no-Septal dev, mucus, polyps, erosion, perforation             Throat- Mallampati III-IV , mucosa clear , drainage- none, tonsils- atrophic, + missing teeth/ poor hygiene, Neck- flexible , trachea midline, no stridor , thyroid nl, carotid no bruit Chest - symmetrical excursion , unlabored           Heart/CV-+ IRR(faint) , no murmur , no gallop  , no rub, nl s1 s2                           - JVD- none , edema- none, stasis  changes- none, varices- none           Lung- clear to P&A, wheeze- none, cough- none , dullness-none, rub- none           Chest wall-  Abd- +right colostomy Br/ Gen/ Rectal- Not done, not indicated Extrem- cyanosis- none, clubbing, none, atrophy- none, strength- nl Neuro- grossly intact to observation

## 2019-10-01 NOTE — Assessment & Plan Note (Signed)
Benefits from CPAP with good compliance and control Plan- continue auto 10-20 

## 2019-10-01 NOTE — Assessment & Plan Note (Signed)
Controlled without recent exacerbation. DOE also reflects morbid obesity and cardiac issues.

## 2019-10-01 NOTE — Assessment & Plan Note (Signed)
362 lbs at this visit. He has not been prepared to make life-style changes sufficient to accomplish meaningful weight loss. Will continue to encourage him.

## 2019-10-24 ENCOUNTER — Encounter (HOSPITAL_BASED_OUTPATIENT_CLINIC_OR_DEPARTMENT_OTHER): Payer: Medicare Other | Attending: Internal Medicine | Admitting: Internal Medicine

## 2019-10-24 DIAGNOSIS — Z7902 Long term (current) use of antithrombotics/antiplatelets: Secondary | ICD-10-CM | POA: Diagnosis not present

## 2019-10-24 DIAGNOSIS — X58XXXS Exposure to other specified factors, sequela: Secondary | ICD-10-CM | POA: Insufficient documentation

## 2019-10-24 DIAGNOSIS — T8131XD Disruption of external operation (surgical) wound, not elsewhere classified, subsequent encounter: Secondary | ICD-10-CM | POA: Insufficient documentation

## 2019-10-24 DIAGNOSIS — Z955 Presence of coronary angioplasty implant and graft: Secondary | ICD-10-CM | POA: Insufficient documentation

## 2019-10-24 DIAGNOSIS — I130 Hypertensive heart and chronic kidney disease with heart failure and stage 1 through stage 4 chronic kidney disease, or unspecified chronic kidney disease: Secondary | ICD-10-CM | POA: Diagnosis not present

## 2019-10-24 DIAGNOSIS — Z86711 Personal history of pulmonary embolism: Secondary | ICD-10-CM | POA: Insufficient documentation

## 2019-10-24 DIAGNOSIS — Z933 Colostomy status: Secondary | ICD-10-CM | POA: Insufficient documentation

## 2019-10-24 DIAGNOSIS — I2510 Atherosclerotic heart disease of native coronary artery without angina pectoris: Secondary | ICD-10-CM | POA: Diagnosis not present

## 2019-10-24 DIAGNOSIS — L98492 Non-pressure chronic ulcer of skin of other sites with fat layer exposed: Secondary | ICD-10-CM | POA: Diagnosis not present

## 2019-10-24 DIAGNOSIS — Z6841 Body Mass Index (BMI) 40.0 and over, adult: Secondary | ICD-10-CM | POA: Diagnosis not present

## 2019-10-24 DIAGNOSIS — I872 Venous insufficiency (chronic) (peripheral): Secondary | ICD-10-CM | POA: Diagnosis not present

## 2019-10-24 DIAGNOSIS — Z85038 Personal history of other malignant neoplasm of large intestine: Secondary | ICD-10-CM | POA: Insufficient documentation

## 2019-10-24 DIAGNOSIS — N183 Chronic kidney disease, stage 3 unspecified: Secondary | ICD-10-CM | POA: Diagnosis not present

## 2019-10-24 DIAGNOSIS — I5022 Chronic systolic (congestive) heart failure: Secondary | ICD-10-CM | POA: Insufficient documentation

## 2019-10-24 DIAGNOSIS — S31105S Unspecified open wound of abdominal wall, periumbilic region without penetration into peritoneal cavity, sequela: Secondary | ICD-10-CM | POA: Insufficient documentation

## 2019-10-24 DIAGNOSIS — J449 Chronic obstructive pulmonary disease, unspecified: Secondary | ICD-10-CM | POA: Diagnosis not present

## 2019-10-24 DIAGNOSIS — Z87891 Personal history of nicotine dependence: Secondary | ICD-10-CM | POA: Diagnosis not present

## 2019-10-24 DIAGNOSIS — G4733 Obstructive sleep apnea (adult) (pediatric): Secondary | ICD-10-CM | POA: Diagnosis not present

## 2019-10-24 NOTE — Progress Notes (Signed)
Guin, Hatcher L. ([id number]) Visit Report for 10/24/2019 HPI Details Patient Name: Date of Service: [facility] MS, [facility] RD L. 10/24/2019 10:15 A M Medical Record Number: [id number] Patient Account Number: [id number] Date of Birth/Sex: Treating RN: 04-02-1950 (69 y.o. Larry [name] Primary Care Provider: Lin Landsman Other Clinician: Referring Provider: Treating Provider/Extender: Shelly Rubenstein, [name] in Treatment: 77 History of Present Illness Location: Patient presents with a wound to left lower leg. Quality: Patient reports No Pain. Severity: Patient states wound(s) are getting worse. Duration: Patient has had the wound for < 2 weeks prior to presenting for treatment Modifying Factors: pvd HPI Description: pt smoked until 5 years ago. cancer surgery 1 year ago. has ruq stoma. cv studies last year revealed non-compressible vessels. recent pe by pcp yielded some heart abnormality and he is scheduled for stress test. 5/24 2 new wounds on rt leg. left side healed 08/12/14; several small wounds on the right lateral leg and one wound on the left leg which is new. The edema control here does not look adequate. There is also some degree of maceration around the wounds on the right lateral leg READMISSION 09/14/2018 This is a patient we had in this clinic in 2013 and then again in 2016 with wounds on his right lateral leg secondary to chronic venous insufficiency. He was cared for I believe in 2016 by Dr. Lindon Romp. The patient is here for review of 6 small wounds within a large surgical area on his abdomen. The patient had surgery for a malignant neoplasm of the sigmoid colon with a colostomy. I cannot see the date of the exact surgery in epic. In February 2018 he had an attempted colostomy reversal however he had a microperforation I believe at the anastomosis. He required a repeat laparotomy. He had an ileostomy raised at that time. I believe the surgical wound at that time was  left to close via secondary intention. The patient is unaware whether he had a wound VAC on this. In any case this almost is completely closed except he has been left with 6 small eraser head sized areas on the abdomen that have not healed. I am not exactly sure how he was how he is been dressing this. He has been followed with frequent visits in general surgery dating back at least over a year. Past medical history includes chronic systolic heart failure, stage III chronic renal failure. Malignant neoplasm of the sigmoid colon, morbid obesity. 7/31; not a lot of change 6 small wounds within a large surgical area on his abdomen. None of these are hyper granulated as opposed to last week. None of them have exposed mesh or surrounding infection. He has been using [facility] 8/14- He comes after 2 weeks, he has only 2 open wounds that I can tell this time, he has been using Hydrofera Blue 8/28; 2-week follow-up. Very superficial areas on the abdominal surgical site scar. He is using Hydrofera Blue and this seems to be doing a good job. He has not made it down to the medical supply store to see if he can get an abdominal binder or something to keep the tension off this wound area. READMISSION 01/04/2019 This is a 69 year old man we had in the clinic from July through August 2020. He had wounds in the midline abdominal scar. [Please see history from my note of 09/14/2018}. When he was here last time we used Hydrofera Blue. Things seem to be improving. He did not discharge in a healed state his  last visit here was on 8/28. He states he continued to use the [facility] and at 1 point this was very close to healing but more recently it is reopened. The wounds are basically in the same state as last time superficial friable easily bleeding wounds. Some hyper granulation. He has an abdominal binder that he got at [facility] however he says it is too tight for him to get on. Past medical history is  reviewed; he has coronary artery disease, stage III chronic renal failure heart failure with reduced ejection fraction, COPD, hypertension, obstructive sleep apnea. He was hospitalized since he was here last time for a severe pharyngitis. 11/19; the patient was in the ER on 11/16; this was for oozing from his abdominal wound. He was cauterized with quick clot gauze and epi and silver nitrate. This was felt to be venous oozing. He has very friable wounds in this area. He is using his abdominal binder. 12/3; the patient was in the ER with bleeding from the lower part of his incision in the abdomen on 11/28. Had a stitch placed to contain the bleeding area. This was just underneath his major wound. He saw general surgery yesterday and apparently we are to remove the stitch in 2 weeks. The patient is on Plavix and aspirin for coronary artery disease with stents. His bleeding was felt to be capillary. 12/17; I removed the stitch from the bleeding area that was placed last time. This came out with some difficulty. The major area almost looks like slough with an area of cellophane over it. I did not attempt any further debridement 02/28/2019. The patient has new areas superiorly and inferiorly to the major wound in the middle. The area in the middle has a nonviable surface over perhaps 60 to 70% however debridement causes uncontrolled bleeding here. He is on Plavix. We have been usin hydrogel and Prisma. 1/21; the patient has 2 superficial areas superiorly he has the major wound in the middle and a small area inferiorly in the middle of this abdominal wound. We switch to Iodoflex last time 2/4; the patient has 3 open areas. 2 of these are on the right part of this surgical wound on his abdomen and one more on the left central. The 2 on the right are hyper granulated I used silver nitrate on these. The area on the left appears to have a fibrinous surface. I attempted to debride this once he ended up in the ER.  I am reluctant to do this again. We have been using Iodoflex 2/25; he has 3 open areas small ones proximally and distally and then the larger one in the middle. This has nonviable debris over fibrinous debris over the surface. He is using Iodoflex 3/30; I follow this man monthly. He has wounds in the middle of the surgical scar in his abdomen. T oday 2 wounds which is an improvement. The major wound in the middle and one inferiorly and medially. 4/26; I follow this patient every month. This is a palliative setting. Wounds in the middle part of his surgical scar in the abdomen. He had significant amounts of denuded skin around the central area. This wipes off with Anasept and gauze. Really not any major improvement. We did get an abdominal binder for him at 1 point as usual patients do not wear these 07/16/19-Patient is back at 1 month, he has 3 small wounds in the center of his surgical scar on his abdomen, these appear to be slightly larger.  He is not using the abdominal binder as it is very uncomfortable 6/25; monthly follow-up. He is down to 2 open areas on the abdomen which appeared somewhat better today the larger area is superiorly and a small area inferiorly. There is debris on the surface of the superior 1 under illumination however very friable tissue. As opposed to what I said a month ago he is still using Iodoflex with some improvement in surface areas 7/23. Monthly follow-up. He has a central area that is still open and this abdominal scar. Not able to get on his abdominal binder that we got some time ago. He is simply growing out of it. One of her nurses suggested Montgomery straps he is going to try those. 9/2; monthly follow-up. He is using Montgomery straps. Actually his wound area looks as good as I have seen it. Still some dry flaking skin that does not appear to be robust but a lot of this is closed. Still using Iodoflex on the wound surface Electronic Signature(s) Signed:  10/24/2019 5:13:24 PM By: Linton Ham MD Entered By: Linton Ham on 10/24/2019 11:48:44 -------------------------------------------------------------------------------- Physical Exam Details Patient Name: Date of Service: Larry Bishop MS, BENFEA RD L. 10/24/2019 10:15 A M Medical Record Number: [id number] Patient Account Number: [id number] Date of Birth/Sex: Treating RN: [birth year] (69 y.o. Larry [name] Primary Care Provider: Lin Landsman Other Clinician: Referring Provider: Treating Provider/Extender: Shelly Rubenstein, [name] in Treatment: 41 Constitutional Sitting or standing Blood Pressure is within target range for patient.. Pulse regular and within target range for patient.[name] Kitchen Respirations regular, non-labored and within target range.. Temperature is normal and within the target range for the patient.[name] Kitchen Appears in no distress. Notes Wound exam; midline scar central wound. Still some dry flaking skin and debris over the wound surface. Nevertheless I think in general this entire area looks as good as I have ever seen it. I do not think it is fully epithelialized by any definition however the open area in the center appears better. Electronic Signature(s) Signed: 10/24/2019 5:13:24 PM By: Linton Ham MD Entered By: Linton Ham on 10/24/2019 11:49:35 -------------------------------------------------------------------------------- Physician Orders Details Patient Name: Date of Service: Larry Bishop MS, BENFEA RD L. 10/24/2019 10:15 A M Medical Record Number: [id number] Patient Account Number: [id number] Date of Birth/Sex: Treating RN: 22-Jul-1950 (69 y.o. Larry [name] Primary Care Provider: Lin Landsman Other Clinician: Referring Provider: Treating Provider/Extender: Shelly Rubenstein, [name] in Treatment: 86 Verbal / Phone Orders: No Diagnosis Coding ICD-10 Coding Code Description T81.31XD Disruption of external operation (surgical) wound, not elsewhere  classified, subsequent encounter S31.105S Unspecified open wound of abdominal wall, periumbilic region without penetration into peritoneal cavity, sequela Follow-up Appointments Return appointment in 1 month. Dressing Change Frequency Wound #9 Distal Abdomen - midline Change Dressing every other day. Wound Cleansing May shower and wash wound with soap and water. - with dressing change Primary Wound Dressing Wound #9 Distal Abdomen - midline Iodoflex Secondary Dressing Wound #9 Distal Abdomen - midline Dry Gauze ABD pad Other: - abdominal binder while up during the day. may remove at night. Electronic Signature(s) Signed: 10/24/2019 5:08:15 PM By: Levan Hurst RN, BSN Signed: 10/24/2019 5:13:24 PM By: Linton Ham MD Entered By: Levan Hurst on 10/24/2019 11:19:37 -------------------------------------------------------------------------------- Problem List Details Patient Name: Date of Service: Larry Bishop MS, BENFEA RD L. 10/24/2019 10:15 A M Medical Record Number: [id number] Patient Account Number: [id number] Date of Birth/Sex: Treating RN: 1950-10-09 (69 y.o. Larry [name] Primary Care Provider: Lin Landsman Other Clinician: Referring Provider: Treating Provider/Extender:  Shelly Rubenstein, [name] in Treatment: 41 Active Problems ICD-10 Encounter Code Description Active Date MDM Diagnosis T81.31XD Disruption of external operation (surgical) wound, not elsewhere classified, 01/04/2019 No Yes subsequent encounter S31.105S Unspecified open wound of abdominal wall, periumbilic region without 94/17/4081 No Yes penetration into peritoneal cavity, sequela Inactive Problems Resolved Problems Electronic Signature(s) Signed: 10/24/2019 5:13:24 PM By: Linton Ham MD Entered By: Linton Ham on 10/24/2019 11:47:52 -------------------------------------------------------------------------------- Progress Note Details Patient Name: Date of Service: Larry Bishop MS,  Rarden L. 10/24/2019 10:15 A M Medical Record Number: [id number] Patient Account Number: [id number] Date of Birth/Sex: Treating RN: 22-Aug-1950 (69 y.o. Larry [name] Primary Care Provider: Lin Landsman Other Clinician: Referring Provider: Treating Provider/Extender: Shelly Rubenstein, [name] in Treatment: 41 Subjective History of Present Illness (HPI) The following HPI elements were documented for the patient's wound: Location: Patient presents with a wound to left lower leg. Quality: Patient reports No Pain. Severity: Patient states wound(s) are getting worse. Duration: Patient has had the wound for < 2 weeks prior to presenting for treatment Modifying Factors: pvd pt smoked until 5 years ago. cancer surgery 1 year ago. has ruq stoma. cv studies last year revealed non-compressible vessels. recent pe by pcp yielded some heart abnormality and he is scheduled for stress test. 5/24 2 new wounds on rt leg. left side healed 08/12/14; several small wounds on the right lateral leg and one wound on the left leg which is new. The edema control here does not look adequate. There is also some degree of maceration around the wounds on the right lateral leg READMISSION 09/14/2018 This is a patient we had in this clinic in 2013 and then again in 2016 with wounds on his right lateral leg secondary to chronic venous insufficiency. He was cared for I believe in 2016 by Dr. Lindon Romp. The patient is here for review of 6 small wounds within a large surgical area on his abdomen. The patient had surgery for a malignant neoplasm of the sigmoid colon with a colostomy. I cannot see the date of the exact surgery in epic. In February 2018 he had an attempted colostomy reversal however he had a microperforation I believe at the anastomosis. He required a repeat laparotomy. He had an ileostomy raised at that time. I believe the surgical wound at that time was left to close via secondary intention. The  patient is unaware whether he had a wound VAC on this. In any case this almost is completely closed except he has been left with 6 small eraser head sized areas on the abdomen that have not healed. I am not exactly sure how he was how he is been dressing this. He has been followed with frequent visits in general surgery dating back at least over a year. Past medical history includes chronic systolic heart failure, stage III chronic renal failure. Malignant neoplasm of the sigmoid colon, morbid obesity. 7/31; not a lot of change 6 small wounds within a large surgical area on his abdomen. None of these are hyper granulated as opposed to last week. None of them have exposed mesh or surrounding infection. He has been using [facility] 8/14- He comes after 2 weeks, he has only 2 open wounds that I can tell this time, he has been using Hydrofera Blue 8/28; 2-week follow-up. Very superficial areas on the abdominal surgical site scar. He is using Hydrofera Blue and this seems to be doing a good job. He has not made it down to the medical supply  store to see if he can get an abdominal binder or something to keep the tension off this wound area. READMISSION 01/04/2019 This is a 69 year old man we had in the clinic from July through August 2020. He had wounds in the midline abdominal scar. [Please see history from my note of 09/14/2018}. When he was here last time we used Hydrofera Blue. Things seem to be improving. He did not discharge in a healed state his last visit here was on 8/28. He states he continued to use the [facility] and at 1 point this was very close to healing but more recently it is reopened. The wounds are basically in the same state as last time superficial friable easily bleeding wounds. Some hyper granulation. He has an abdominal binder that he got at [facility] however he says it is too tight for him to get on. Past medical history is reviewed; he has coronary artery disease, stage  III chronic renal failure heart failure with reduced ejection fraction, COPD, hypertension, obstructive sleep apnea. He was hospitalized since he was here last time for a severe pharyngitis. 11/19; the patient was in the ER on 11/16; this was for oozing from his abdominal wound. He was cauterized with quick clot gauze and epi and silver nitrate. This was felt to be venous oozing. He has very friable wounds in this area. He is using his abdominal binder. 12/3; the patient was in the ER with bleeding from the lower part of his incision in the abdomen on 11/28. Had a stitch placed to contain the bleeding area. This was just underneath his major wound. He saw general surgery yesterday and apparently we are to remove the stitch in 2 weeks. The patient is on Plavix and aspirin for coronary artery disease with stents. His bleeding was felt to be capillary. 12/17; I removed the stitch from the bleeding area that was placed last time. This came out with some difficulty. The major area almost looks like slough with an area of cellophane over it. I did not attempt any further debridement 02/28/2019. The patient has new areas superiorly and inferiorly to the major wound in the middle. The area in the middle has a nonviable surface over perhaps 60 to 70% however debridement causes uncontrolled bleeding here. He is on Plavix. We have been usin hydrogel and Prisma. 1/21; the patient has 2 superficial areas superiorly he has the major wound in the middle and a small area inferiorly in the middle of this abdominal wound. We switch to Iodoflex last time 2/4; the patient has 3 open areas. 2 of these are on the right part of this surgical wound on his abdomen and one more on the left central. The 2 on the right are hyper granulated I used silver nitrate on these. The area on the left appears to have a fibrinous surface. I attempted to debride this once he ended up in the ER. I am reluctant to do this again. We have been  using Iodoflex 2/25; he has 3 open areas small ones proximally and distally and then the larger one in the middle. This has nonviable debris over fibrinous debris over the surface. He is using Iodoflex 3/30; I follow this man monthly. He has wounds in the middle of the surgical scar in his abdomen. T oday 2 wounds which is an improvement. The major wound in the middle and one inferiorly and medially. 4/26; I follow this patient every month. This is a palliative setting. Wounds in the  middle part of his surgical scar in the abdomen. He had significant amounts of denuded skin around the central area. This wipes off with Anasept and gauze. Really not any major improvement. We did get an abdominal binder for him at 1 point as usual patients do not wear these 07/16/19-Patient is back at 1 month, he has 3 small wounds in the center of his surgical scar on his abdomen, these appear to be slightly larger. He is not using the abdominal binder as it is very uncomfortable 6/25; monthly follow-up. He is down to 2 open areas on the abdomen which appeared somewhat better today the larger area is superiorly and a small area inferiorly. There is debris on the surface of the superior 1 under illumination however very friable tissue. As opposed to what I said a month ago he is still using Iodoflex with some improvement in surface areas 7/23. Monthly follow-up. He has a central area that is still open and this abdominal scar. Not able to get on his abdominal binder that we got some time ago. He is simply growing out of it. One of her nurses suggested Montgomery straps he is going to try those. 9/2; monthly follow-up. He is using Montgomery straps. Actually his wound area looks as good as I have seen it. Still some dry flaking skin that does not appear to be robust but a lot of this is closed. Still using Iodoflex on the wound surface Objective Constitutional Sitting or standing Blood Pressure is within target range  for patient.. Pulse regular and within target range for patient.[name] Kitchen Respirations regular, non-labored and within target range.. Temperature is normal and within the target range for the patient.[name] Kitchen Appears in no distress. Vitals Time Taken: 10:48 AM, Height: 71 in, Weight: 344 lbs, BMI: 48, Temperature: 98.4 F, Pulse: 116 bpm, Respiratory Rate: 18 breaths/min, Blood Pressure: 137/75 mmHg. General Notes: Wound exam; midline scar central wound. Still some dry flaking skin and debris over the wound surface. Nevertheless I think in general this entire area looks as good as I have ever seen it. I do not think it is fully epithelialized by any definition however the open area in the center appears better. Integumentary (Hair, Skin) Wound #9 status is Open. Original cause of wound was Gradually Appeared. The wound is located on the Distal Abdomen - midline. The wound measures 5.5cm length x 3cm width x 0.1cm depth; 12.959cm^2 area and 1.296cm^3 volume. There is Fat Layer (Subcutaneous Tissue) exposed. There is no tunneling or undermining noted. There is a medium amount of serous drainage noted. The wound margin is flat and intact. There is large (67-100%) pink granulation within the wound bed. There is a small (1-33%) amount of necrotic tissue within the wound bed. Assessment Active Problems ICD-10 Disruption of external operation (surgical) wound, not elsewhere classified, subsequent encounter Unspecified open wound of abdominal wall, periumbilic region without penetration into peritoneal cavity, sequela Plan Follow-up Appointments: Return appointment in 1 month. Dressing Change Frequency: Wound #9 Distal Abdomen - midline: Change Dressing every other day. Wound Cleansing: May shower and wash wound with soap and water. - with dressing change Primary Wound Dressing: Wound #9 Distal Abdomen - midline: Iodoflex Secondary Dressing: Wound #9 Distal Abdomen - midline: Dry Gauze ABD pad Other: -  abdominal binder while up during the day. may remove at night. 1. Appears to be doing very well 2. Continue Iodoflex/Montgomery straps. Electronic Signature(s) Signed: 10/24/2019 5:13:24 PM By: Linton Ham MD Entered By: Linton Ham on 10/24/2019 11:50:05 --------------------------------------------------------------------------------  SuperBill Details Patient Name: Date of Service: Larry [name] RD L. 10/24/2019 Medical Record Number: [id number] Patient Account Number: [id number] Date of Birth/Sex: Treating RN: May 24, 1950 (68 y.o. Larry [name] Primary Care Provider: Lin Landsman Other Clinician: Referring Provider: Treating Provider/Extender: Shelly Rubenstein, [name] in Treatment: 59 Diagnosis Coding ICD-10 Codes Code Description T81.31XD Disruption of external operation (surgical) wound, not elsewhere classified, subsequent encounter S31.105S Unspecified open wound of abdominal wall, periumbilic region without penetration into peritoneal cavity, sequela Facility Procedures The patient participates with Medicare or their insurance follows the Medicare Facility Guidelines: CPT4 Code Description Modifier Quantity [id number] [place] VISIT-LEV 3 EST PT 1 Physician Procedures : CPT4 Code Description Modifier [id number] - WC PHYS LEVEL 2 - EST PT ICD-10 Diagnosis Description T81.31XD Disruption of external operation (surgical) wound, not elsewhere classified, subsequent encounter S31.105S Unspecified open wound of abdominal  wall, periumbilic region without penetration into peritoneal cavi Quantity: 1 ty, sequela Electronic Signature(s) Signed: 10/24/2019 5:08:15 PM By: Levan Hurst RN, BSN Signed: 10/24/2019 5:13:24 PM By: Linton Ham MD Entered By: Levan Hurst on 10/24/2019 13:15:17

## 2019-10-30 NOTE — Progress Notes (Signed)
Macioce, Erving L. ([id number]) Visit Report for 10/24/2019 Arrival Information Details Patient Name: Date of Service: Larry [name], [name] 10/24/2019 10:15 A M Medical Record Number: [id number] Patient Account Number: [id number] Date of Birth/Sex: Treating RN: 18-Sep-1950 (68 y.o. Jerilynn Mages) Carlene Coria Primary Care Nyara Capell: Lin Landsman Other Clinician: Referring Chasya Zenz: Treating Corie Vavra/Extender: Shelly Rubenstein, [name] in Treatment: 24 Visit Information History Since Last Visit All ordered tests and consults were completed: No Patient Arrived: Ambulatory Added or deleted any medications: No Arrival Time: 10:47 Any Larry allergies or adverse reactions: No Accompanied By: self Had a fall or experienced change in No Transfer Assistance: None activities of daily living that may affect Patient Identification Verified: Yes risk of falls: Secondary Verification Process Completed: Yes Signs or symptoms of abuse/neglect since last visito No Patient Requires Transmission-Based Precautions: No Hospitalized since last visit: No Patient Has Alerts: No Implantable device outside of the clinic excluding No cellular tissue based products placed in the center since last visit: Pain Present Now: No Electronic Signature(s) Signed: 10/30/2019 5:14:09 PM By: Carlene Coria RN Entered By: Carlene Coria on 10/24/2019 10:48:19 -------------------------------------------------------------------------------- Clinic Level of Care Assessment Details Patient Name: Date of Service: Larry Greek RD L. 10/24/2019 10:15 A M Medical Record Number: [id number] Patient Account Number: [id number] Date of Birth/Sex: Treating RN: 24-May-1950 (69 y.o. [name] Primary Care Teaira Croft: Lin Landsman Other Clinician: Referring Kiasha Bellin: Treating Kaikoa Magro/Extender: Shelly Rubenstein, [name] in Treatment: 41 Clinic Level of Care Assessment Items TOOL 4 Quantity Score X- 1 0 Use when only  an EandM is performed on FOLLOW-UP visit ASSESSMENTS - Nursing Assessment / Reassessment X- 1 10 Reassessment of Co-morbidities (includes updates in patient status) X- 1 5 Reassessment of Adherence to Treatment Plan ASSESSMENTS - Wound and Skin A ssessment / Reassessment X - Simple Wound Assessment / Reassessment - one wound 1 5 []  - 0 Complex Wound Assessment / Reassessment - multiple wounds []  - 0 Dermatologic / Skin Assessment (not related to wound area) ASSESSMENTS - Focused Assessment []  - 0 Circumferential Edema Measurements - multi extremities []  - 0 Nutritional Assessment / Counseling / Intervention []  - 0 Lower Extremity Assessment (monofilament, tuning fork, pulses) []  - 0 Peripheral Arterial Disease Assessment (using hand held doppler) ASSESSMENTS - Ostomy and/or Continence Assessment and Care []  - 0 Incontinence Assessment and Management []  - 0 Ostomy Care Assessment and Management (repouching, etc.) PROCESS - Coordination of Care X - Simple Patient / Family Education for ongoing care 1 15 []  - 0 Complex (extensive) Patient / Family Education for ongoing care X- 1 10 Staff obtains Programmer, systems, Records, T Results / Process Orders est []  - 0 Staff telephones HHA, Nursing Homes / Clarify orders / etc []  - 0 Routine Transfer to another Facility (non-emergent condition) []  - 0 Routine Hospital Admission (non-emergent condition) []  - 0 Larry Admissions / Biomedical engineer / Ordering NPWT Apligraf, etc. , []  - 0 Emergency Hospital Admission (emergent condition) X- 1 10 Simple Discharge Coordination []  - 0 Complex (extensive) Discharge Coordination PROCESS - Special Needs []  - 0 Pediatric / Minor Patient Management []  - 0 Isolation Patient Management []  - 0 Hearing / Language / Visual special needs []  - 0 Assessment of Community assistance (transportation, D/C planning, etc.) []  - 0 Additional assistance / Altered mentation []  - 0 Support Surface(s)  Assessment (bed, cushion, seat, etc.) INTERVENTIONS - Wound Cleansing / Measurement X - Simple Wound Cleansing - one wound 1 5 []  - 0 Complex Wound  Cleansing - multiple wounds X- 1 5 Wound Imaging (photographs - any number of wounds) []  - 0 Wound Tracing (instead of photographs) X- 1 5 Simple Wound Measurement - one wound []  - 0 Complex Wound Measurement - multiple wounds INTERVENTIONS - Wound Dressings []  - 0 Small Wound Dressing one or multiple wounds X- 1 15 Medium Wound Dressing one or multiple wounds []  - 0 Large Wound Dressing one or multiple wounds []  - 0 Application of Medications - topical []  - 0 Application of Medications - injection INTERVENTIONS - Miscellaneous []  - 0 External ear exam []  - 0 Specimen Collection (cultures, biopsies, blood, body fluids, etc.) []  - 0 Specimen(s) / Culture(s) sent or taken to Lab for analysis []  - 0 Patient Transfer (multiple staff / Civil Service fast streamer / Similar devices) []  - 0 Simple Staple / Suture removal (25 or less) []  - 0 Complex Staple / Suture removal (26 or more) []  - 0 Hypo / Hyperglycemic Management (close monitor of Blood Glucose) []  - 0 Ankle / Brachial Index (ABI) - do not check if billed separately X- 1 5 Vital Signs Has the patient been seen at the hospital within the last three years: Yes Total Score: 90 Level Of Care: Larry/Established - Level 3 Electronic Signature(s) Signed: 10/24/2019 5:08:15 PM By: Levan Hurst RN, BSN Entered By: Levan Hurst on 10/24/2019 13:15:06 -------------------------------------------------------------------------------- Encounter Discharge Information Details Patient Name: Date of Service: Larry [name], Larry RD L. 10/24/2019 10:15 A M Medical Record Number: [id number] Patient Account Number: [id number] Date of Birth/Sex: Treating RN: 05/25/1950 (69 y.o. [name] Primary Care Dacota Ruben: Lin Landsman Other Clinician: Referring Jaleen Finch: Treating Tanielle Emigh/Extender: Shelly Rubenstein, [name] in Treatment: 70 Encounter Discharge Information Items Discharge Condition: Stable Ambulatory Status: Ambulatory Discharge Destination: Home Transportation: Private Auto Accompanied By: self Schedule Follow-up Appointment: Yes Clinical Summary of Care: Patient Declined Electronic Signature(s) Signed: 10/25/2019 2:45:56 PM By: Baruch Gouty RN, BSN Entered By: Baruch Gouty on 10/24/2019 11:28:45 -------------------------------------------------------------------------------- Lower Extremity Assessment Details Patient Name: Date of Service: Larry [name], [name] 10/24/2019 10:15 A M Medical Record Number: [id number] Patient Account Number: [id number] Date of Birth/Sex: Treating RN: 02-Aug-1950 (68 y.o. Oval Linsey Primary Care Earma Nicolaou: Lin Landsman Other Clinician: Referring Bret Vanessen: Treating Ritika Hellickson/Extender: Shelly Rubenstein, [name] in Treatment: 41 Electronic Signature(s) Signed: 10/30/2019 5:14:09 PM By: Carlene Coria RN Entered By: Carlene Coria on 10/24/2019 10:49:18 -------------------------------------------------------------------------------- Multi Wound Chart Details Patient Name: Date of Service: Larry [name], Larry RD L. 10/24/2019 10:15 A M Medical Record Number: [id number] Patient Account Number: [id number] Date of Birth/Sex: Treating RN: Jan 15, 1951 (69 y.o. [name] Primary Care Chlora Mcbain: Lin Landsman Other Clinician: Referring Daven Pinckney: Treating Arshia Rondon/Extender: Shelly Rubenstein, [name] in Treatment: 41 Vital Signs Height(in): 71 Pulse(bpm): 116 Weight(lbs): 344 Blood Pressure(mmHg): 137/75 Body Mass Index(BMI): 48 Temperature(F): 98.4 Respiratory Rate(breaths/min): 18 Photos: [9:No Photos Distal Abdomen - midline] [N/A:N/A N/A] Wound Location: [9:Gradually Appeared] [N/A:N/A] Wounding Event: [9:Open Surgical Wound] [N/A:N/A] Primary Etiology: [9:Asthma, Chronic Obstructive]  [N/A:N/A] Comorbid History: [9:Pulmonary Disease (COPD), Sleep Apnea, Arrhythmia, Congestive Heart Failure, Coronary Artery Disease, Hypertension, Peripheral Arterial Disease, Peripheral Venous Disease 02/28/2019] [N/A:N/A] Date Acquired: [9:34] [N/A:N/A] Weeks of Treatment: [9:Open] [N/A:N/A] Wound Status: [9:5.5x3x0.1] [N/A:N/A] Measurements L x W x D (cm) [9:12.959] [N/A:N/A] A (cm) : rea [9:1.296] [N/A:N/A] Volume (cm) : [9:-1275.70%] [N/A:N/A] % Reduction in Area: [9:-1278.70%] [N/A:N/A] % Reduction in Volume: [9:Full Thickness Without Exposed] [N/A:N/A] Classification: [9:Support Structures Medium] [N/A:N/A] Exudate Amount: [9:Serous] [N/A:N/A] Exudate Type: [9:amber] [N/A:N/A]  Exudate Color: [9:Flat and Intact] [N/A:N/A] Wound Margin: [9:Large (67-100%)] [N/A:N/A] Granulation Amount: [9:Pink] [N/A:N/A] Granulation Quality: [9:Small (1-33%)] [N/A:N/A] Necrotic Amount: [9:Fat Layer (Subcutaneous Tissue): Yes N/A] Exposed Structures: [9:Fascia: No Tendon: No Muscle: No Joint: No Bone: No Medium (34-66%)] [N/A:N/A] Treatment Notes Wound #9 (Distal Abdomen - midline) 3. Primary Dressing Applied Iodoflex 4. Secondary Dressing ABD Pad 5. Secured With Other (specify in notes) Notes montgomery straps Electronic Signature(s) Signed: 10/24/2019 5:13:24 PM By: Linton Ham MD Signed: 10/30/2019 5:39:04 PM By: Deon Pilling Entered By: Linton Ham on 10/24/2019 11:48:00 -------------------------------------------------------------------------------- [place] Details Patient Name: Date of Service: Larry [name], Larry RD L. 10/24/2019 10:15 A M Medical Record Number: [id number] Patient Account Number: [id number] Date of Birth/Sex: Treating RN: 1950/07/17 (69 y.o. [name] Primary Care Eugena Rhue: Lin Landsman Other Clinician: Referring Ronin Crager: Treating Linford Quintela/Extender: Shelly Rubenstein, [name] in Treatment: 74 Active  Inactive Wound/Skin Impairment Nursing Diagnoses: Impaired tissue integrity Goals: Patient/caregiver will verbalize understanding of skin care regimen Date Initiated: 10/24/2019 Target Resolution Date: 11/22/2019 Goal Status: Active Ulcer/skin breakdown will have a volume reduction of 30% by week 4 Date Initiated: 01/04/2019 Date Inactivated: 03/28/2019 Target Resolution Date: 04/05/2019 Unmet Reason: no change see wound Goal Status: Unmet size at week 4. Interventions: Provide education on ulcer and skin care Notes: Electronic Signature(s) Signed: 10/24/2019 5:08:15 PM By: Levan Hurst RN, BSN Entered By: Levan Hurst on 10/24/2019 13:14:35 -------------------------------------------------------------------------------- Pain Assessment Details Patient Name: Date of Service: Larry [name], Larry [place] L. 10/24/2019 10:15 A M Medical Record Number: [id number] Patient Account Number: [id number] Date of Birth/Sex: Treating RN: [birth year] (68 y.o. Jerilynn Mages) Carlene Coria Primary Care Kanoelani Dobies: Lin Landsman Other Clinician: Referring Arnie Clingenpeel: Treating Kazuko Clemence/Extender: Shelly Rubenstein, [name] in Treatment: 64 Active Problems Location of Pain Severity and Description of Pain Patient Has Paino No Site Locations Pain Management and Medication Current Pain Management: Electronic Signature(s) Signed: 10/30/2019 5:14:09 PM By: Carlene Coria RN Entered By: Carlene Coria on 10/24/2019 10:48:50 -------------------------------------------------------------------------------- Patient/Caregiver Education Details Patient Name: Date of Service: Gladys Damme, [place] 9/2/2021andnbsp10:15 [place] Record Number: [id number] Patient Account Number: [id number] Date of Birth/Gender: Treating RN: 1950/06/20 (69 y.o. [name] Primary Care Physician: Lin Landsman Other Clinician: Referring Physician: Treating Physician/Extender: Lavella Lemons Weeks in Treatment:  69 Education Assessment Education Provided To: Patient Education Topics Provided Wound/Skin Impairment: Methods: Explain/Verbal Responses: State content correctly Electronic Signature(s) Signed: 10/24/2019 5:08:15 PM By: Levan Hurst RN, BSN Entered By: Levan Hurst on 10/24/2019 13:14:46 -------------------------------------------------------------------------------- Wound Assessment Details Patient Name: Date of Service: Larry [name], Larry RD L. 10/24/2019 10:15 A M Medical Record Number: [id number] Patient Account Number: [id number] Date of Birth/Sex: Treating RN: Sep 23, 1950 (68 y.o. Jerilynn Mages) Carlene Coria Primary Care Elivia Robotham: Lin Landsman Other Clinician: Referring Rosabel Sermeno: Treating Dehlia Kilner/Extender: Shelly Rubenstein, [name] in Treatment: 41 Wound Status Wound Number: 9 Primary Open Surgical Wound Etiology: Wound Location: Distal Abdomen - midline Wound Open Wounding Event: Gradually Appeared Status: Date Acquired: 02/28/2019 Comorbid Asthma, Chronic Obstructive Pulmonary Disease (COPD), Sleep Weeks Of Treatment: 34 History: Apnea, Arrhythmia, Congestive Heart Failure, Coronary Artery Clustered Wound: No Disease, Hypertension, Peripheral Arterial Disease, Peripheral Venous Disease Photos Photo Uploaded By: Mikeal Hawthorne on 10/30/2019 13:29:31 Wound Measurements Length: (cm) 5.5 Width: (cm) 3 Depth: (cm) 0.1 Area: (cm) 12.959 Volume: (cm) 1.296 % Reduction in Area: -1275.7% % Reduction in Volume: -1278.7% Epithelialization: Medium (34-66%) Tunneling: No Undermining: No Wound Description Classification: Full Thickness Without Exposed Support Struct Wound Margin: Flat and Intact  Exudate Amount: Medium Exudate Type: Serous Exudate Color: amber ures Foul Odor After Cleansing: No Slough/Fibrino Yes Wound Bed Granulation Amount: Large (67-100%) Exposed Structure Granulation Quality: Pink Fascia Exposed: No Necrotic Amount: Small (1-33%) Fat Layer  (Subcutaneous Tissue) Exposed: Yes Tendon Exposed: No Muscle Exposed: No Joint Exposed: No Bone Exposed: No Treatment Notes Wound #9 (Distal Abdomen - midline) 3. Primary Dressing Applied Iodoflex 4. Secondary Dressing ABD Pad 5. Secured With Other (specify in notes) Notes montgomery straps Electronic Signature(s) Signed: 10/30/2019 5:14:09 PM By: Carlene Coria RN Entered By: Carlene Coria on 10/24/2019 10:50:07 -------------------------------------------------------------------------------- Vitals Details Patient Name: Date of Service: Larry [name], [name] 10/24/2019 10:15 A M Medical Record Number: [id number] Patient Account Number: [id number] Date of Birth/Sex: Treating RN: Sep 02, 1950 (68 y.o. Jerilynn Mages) Carlene Coria Primary Care Keiji Melland: Lin Landsman Other Clinician: Referring Kimaya Whitlatch: Treating Gustavo Meditz/Extender: Shelly Rubenstein, [name] in Treatment: 41 Vital Signs Time Taken: 10:48 Temperature (F): 98.4 Height (in): 71 Pulse (bpm): 116 Weight (lbs): 344 Respiratory Rate (breaths/min): 18 Body Mass Index (BMI): 48 Blood Pressure (mmHg): 137/75 Reference Range: 80 - 120 mg / dl Electronic Signature(s) Signed: 10/30/2019 5:14:09 PM By: Carlene Coria RN Entered By: Carlene Coria on 10/24/2019 10:48:42

## 2019-11-06 ENCOUNTER — Other Ambulatory Visit: Payer: Self-pay | Admitting: Nurse Practitioner

## 2019-11-12 NOTE — Progress Notes (Signed)
Larry [name], [name] ([id number]) Visit Report for 09/13/2019 Arrival Information Details Patient Name: Date of Service: Larry [name],  [facility]. 09/13/2019 10:15 A M Medical Record Number: [id number] Patient Account Number: [id number] Date of Birth/Sex: Treating RN: Oct 04, 1950 (69 y.o. [name] Primary Care Sinai Mahany: Lin Landsman Other Clinician: Referring Cristobal Advani: Treating Labib Cwynar/Extender: Shelly Rubenstein, [name] in Treatment: 64 Visit Information History Since Last Visit Added or deleted any medications: No Patient Arrived: Ambulatory Any new allergies or adverse reactions: No Arrival Time: 10:40 Had a fall or experienced change in No Accompanied By: self activities of daily living that may affect Transfer Assistance: None risk of falls: Patient Identification Verified: Yes Signs or symptoms of abuse/neglect since last visito No Secondary Verification Process Completed: Yes Hospitalized since last visit: No Patient Requires Transmission-Based Precautions: No Implantable device outside of the clinic excluding No Patient Has Alerts: No cellular tissue based products placed in the center since last visit: Has Dressing in Place as Prescribed: Yes Pain Present Now: No Electronic Signature(s) Signed: 11/12/2019 8:09:04 AM By: Sandre Kitty Entered By: Sandre Kitty on 09/13/2019 10:40:18 -------------------------------------------------------------------------------- Encounter Discharge Information Details Patient Name: Date of Service: Larry [name], Larry RD L. 09/13/2019 10:15 A M Medical Record Number: [id number] Patient Account Number: [id number] Date of Birth/Sex: Treating RN: 06-Jul-1950 (69 y.o. [name] Primary Care [address]: Lin Landsman Other Clinician: Referring Lakecia Deschamps: Treating Nadirah Socorro/Extender: Lavella Lemons Weeks in Treatment: 42 Encounter Discharge Information Items Discharge Condition:  Stable Ambulatory Status: Ambulatory Discharge Destination: Home Transportation: Private Auto Accompanied By: self Schedule Follow-up Appointment: Yes Clinical Summary of Care: Patient Declined Electronic Signature(s) Signed: 09/13/2019 5:51:32 PM By: Kela Millin Entered By: Kela Millin on 09/13/2019 11:34:50 -------------------------------------------------------------------------------- Multi Wound Chart Details Patient Name: Date of Service: Larry [name], Larry RD L. 09/13/2019 10:15 A M Medical Record Number: [id number] Patient Account Number: [id number] Date of Birth/Sex: Treating RN: 1950-08-26 (69 y.o. [name] Primary Care Epsie Walthall: Lin Landsman Other Clinician: Referring Raeonna Milo: Treating Keven Osborn/Extender: Shelly Rubenstein, [name] in Treatment: 36 Vital Signs Height(in): 71 Pulse(bpm): 67 Weight(lbs): 344 Blood Pressure(mmHg): 118/74 Body Mass Index(BMI): 48 Temperature(F): 97.9 Respiratory Rate(breaths/min): 20 Photos: [8:No Photos Abdomen - midline] [9:No Photos Distal Abdomen - midline] [N/A:N/A N/A] Wound Location: [8:Surgical Injury] [9:Gradually Appeared] [N/A:N/A] Wounding Event: [8:Open Surgical Wound] [9:Open Surgical Wound] [N/A:N/A] Primary Etiology: [8:Asthma, Chronic Obstructive] [9:Asthma, Chronic Obstructive] [N/A:N/A] Comorbid History: [8:Pulmonary Disease (COPD), Sleep Pulmonary Disease (COPD), Sleep Apnea, Arrhythmia, Congestive Heart Apnea, Arrhythmia, Congestive Heart Failure, Coronary Artery Disease, Hypertension, Peripheral Arterial Disease, Peripheral Venous  Disease Disease, Peripheral Venous Disease 02/21/2017] [9:Failure, Coronary Artery Disease, Hypertension, Peripheral Arterial 02/28/2019] [N/A:N/A] Date Acquired: [8:36] [9:28] [N/A:N/A] Weeks of Treatment: [8:Open] [9:Open] [N/A:N/A] Wound Status: [8:Yes] [9:No] [N/A:N/A] Clustered Wound: [8:0x0x0] [9:4x2x0.1] [N/A:N/A] Measurements L x W x D (cm) [8:0]  [9:6.283] [N/A:N/A] A (cm) : rea [8:0] [9:0.628] [N/A:N/A] Volume (cm) : [8:100.00%] [9:-567.00%] [N/A:N/A] % Reduction in Area: [8:100.00%] [9:-568.10%] [N/A:N/A] % Reduction in Volume: [8:Full Thickness Without Exposed] [9:Full Thickness Without Exposed] [N/A:N/A] Classification: [8:Support Structures None Present] [9:Support Structures Medium] [N/A:N/A] Exudate Amount: [8:N/A] [9:Serous] [N/A:N/A] Exudate Type: [8:N/A] [9:amber] [N/A:N/A] Exudate Color: [8:Flat and Intact] [9:Flat and Intact] [N/A:N/A] Wound Margin: [8:None Present (0%)] [9:Large (67-100%)] [N/A:N/A] Granulation Amount: [8:N/A] [9:Pink] [N/A:N/A] Granulation Quality: [8:None Present (0%)] [9:None Present (0%)] [N/A:N/A] Necrotic Amount: [8:Fat Layer (Subcutaneous Tissue): Yes Fat Layer (Subcutaneous Tissue): Yes N/A] Exposed Structures: [8:Fascia: No Tendon: No Muscle: No Joint: No Bone: No Large (67-100%)] [9:Fascia: No Tendon: No Muscle:  No Joint: No Bone: No Medium (34-66%)] [N/A:N/A] Epithelialization: [8:N/A] [9:Chemical Cauterization] [N/A:N/A] Treatment Notes Electronic Signature(s) Signed: 09/13/2019 5:20:32 PM By: Linton Ham MD Signed: 09/13/2019 5:51:32 PM By: Kela Millin Entered By: Linton Ham on 09/13/2019 11:30:55 -------------------------------------------------------------------------------- Multi-Disciplinary Care Plan Details Patient Name: Date of Service: Larry [name], Larry RD L. 09/13/2019 10:15 A M Medical Record Number: [id number] Patient Account Number: [id number] Date of Birth/Sex: Treating RN: March 11, 1950 (69 y.o. [name] Primary Care Nyah Shepherd: Lin Landsman Other Clinician: Referring Lavere Stork: Treating Patricia Fargo/Extender: Shelly Rubenstein, [name] in Treatment: 4 Active Inactive Nutrition Nursing Diagnoses: Potential for alteratiion in Nutrition/Potential for imbalanced nutrition Goals: Patient/caregiver agrees to and verbalizes understanding of  need to obtain nutritional consultation Date Initiated: 03/28/2019 Target Resolution Date: 09/13/2019 Goal Status: Active Interventions: Provide education on nutrition Treatment Activities: Education provided on Nutrition : 08/16/2019 Notes: Electronic Signature(s) Signed: 09/13/2019 5:51:32 PM By: Kela Millin Entered By: Kela Millin on 09/13/2019 10:58:25 -------------------------------------------------------------------------------- Pain Assessment Details Patient Name: Date of Service: Larry [name], Larry RD L. 09/13/2019 10:15 A M Medical Record Number: [id number] Patient Account Number: [id number] Date of Birth/Sex: Treating RN: 06-11-1950 (69 y.o. [name] Primary Care Abigal Choung: Lin Landsman Other Clinician: Referring Jonathan Kirkendoll: Treating Barbarann Kelly/Extender: Shelly Rubenstein, [name] in Treatment: 36 Active Problems Location of Pain Severity and Description of Pain Patient Has Paino No Site Locations Pain Management and Medication Current Pain Management: Electronic Signature(s) Signed: 09/13/2019 5:51:32 PM By: Kela Millin Signed: 11/12/2019 8:09:04 AM By: Sandre Kitty Entered By: Sandre Kitty on 09/13/2019 10:40:42 -------------------------------------------------------------------------------- Patient/Caregiver Education Details Patient Name: Date of Service: Larry [name], [name] 7/23/2021andnbsp10:15 A M Medical Record Number: [id number] Patient Account Number: [id number] Date of Birth/Gender: Treating RN: 11/10/1950 (69 y.o. [name] Primary Care Physician: Lin Landsman Other Clinician: Referring Physician: Treating Physician/Extender: Lavella Lemons Weeks in Treatment: 41 Education Assessment Education Provided To: Patient Education Topics Provided Nutrition: Handouts: Nutrition Methods: Explain/Verbal Responses: State content correctly Electronic Signature(s) Signed: 09/13/2019  5:51:32 PM By: Kela Millin Entered By: Kela Millin on 09/13/2019 10:58:42 -------------------------------------------------------------------------------- Wound Assessment Details Patient Name: Date of Service: Larry [name], [place] RD L. 09/13/2019 10:15 A M Medical Record Number: [id number] Patient Account Number: [id number] Date of Birth/Sex: Treating RN: 1950-03-02 (68 y.o. Larry [name]) Carlene Coria Primary Care Mcgwire Dasaro: Lin Landsman Other Clinician: Referring [place]: Treating Kanden Carey/Extender: Shelly Rubenstein, [name] in Treatment: 36 Wound Status Wound Number: [id number] Primary Open Surgical Wound Etiology: Wound Location: Abdomen - midline Wound Open Wounding Event: Surgical Injury Status: Date Acquired: 02/21/2017 Comorbid Asthma, Chronic Obstructive Pulmonary Disease (COPD), Sleep Weeks Of Treatment: 36 History: Apnea, Arrhythmia, Congestive Heart Failure, Coronary Artery Clustered Wound: Yes Disease, Hypertension, Peripheral Arterial Disease, Peripheral Venous Disease Wound Measurements Length: (cm) Width: (cm) Depth: (cm) Area: (cm) Volume: (cm) Wound Description Classification: Full Thickness Without Exposed Support Structu Wound Margin: Flat and Intact Exudate Amount: None Present Foul Odor After Cleansing: Slough/Fibrino 0 % Reduction in Area: 100% 0 % Reduction in Volume: 100% 0 Epithelialization: Large (67-100%) 0 Tunneling: No 0 Undermining: No res No No Wound Bed Granulation Amount: None Present (0%) Exposed Structure Necrotic Amount: None Present (0%) Fascia Exposed: No Fat Layer (Subcutaneous Tissue) Exposed: Yes Tendon Exposed: No Muscle Exposed: No Joint Exposed: No Bone Exposed: No Electronic Signature(s) Signed: 09/13/2019 5:42:58 PM By: Carlene Coria RN Entered By: Carlene Coria on 09/13/2019 10:49:13 -------------------------------------------------------------------------------- Wound Assessment Details Patient Name: Date of  Service: Larry [name], Larry RD L. 09/13/2019 10:15 A M Medical Record  Number: [id number] Patient Account Number: [id number] Date of Birth/Sex: Treating RN: 10-26-1950 (68 y.o. Larry [name]) Carlene Coria Primary Care Kiara Keep: Lin Landsman Other Clinician: Referring Alaynah Schutter: Treating Mailen Newborn/Extender: Shelly Rubenstein, [name] in Treatment: 36 Wound Status Wound Number: 9 Primary Open Surgical Wound Etiology: Wound Location: Distal Abdomen - midline Wound Open Wounding Event: Gradually Appeared Status: Date Acquired: 02/28/2019 Comorbid Asthma, Chronic Obstructive Pulmonary Disease (COPD), Sleep Weeks Of Treatment: 28 History: Apnea, Arrhythmia, Congestive Heart Failure, Coronary Artery Clustered Wound: No Disease, Hypertension, Peripheral Arterial Disease, Peripheral Venous Disease Photos Photo Uploaded By: Mikeal Hawthorne on 09/13/2019 13:41:36 Wound Measurements Length: (cm) 4 Width: (cm) 2 Depth: (cm) 0.1 Area: (cm) 6.283 Volume: (cm) 0.628 % Reduction in Area: -567% % Reduction in Volume: -568.1% Epithelialization: Medium (34-66%) Tunneling: No Undermining: No Wound Description Classification: Full Thickness Without Exposed Support Structures Wound Margin: Flat and Intact Exudate Amount: Medium Exudate Type: Serous Exudate Color: amber Wound Bed Granulation Amount: Large (67-100%) Granulation Quality: Pink Necrotic Amount: None Present (0%) Foul Odor After Cleansing: No Slough/Fibrino No Exposed Structure Fascia Exposed: No Fat Layer (Subcutaneous Tissue) Exposed: Yes Tendon Exposed: No Muscle Exposed: No Joint Exposed: No Bone Exposed: No Electronic Signature(s) Signed: 09/13/2019 5:42:58 PM By: Carlene Coria RN Entered By: Carlene Coria on 09/13/2019 10:49:57 -------------------------------------------------------------------------------- Vitals Details Patient Name: Date of Service: Larry [name], Larry RD L. 09/13/2019 10:15 A M Medical Record Number:  [id number] Patient Account Number: [id number] Date of Birth/Sex: Treating RN: [birth year] (69 y.o. [name] Primary Care Ardean Melroy: Lin Landsman Other Clinician: Referring Shiquita Collignon: Treating Saba Gomm/Extender: Shelly Rubenstein, [name] in Treatment: 36 Vital Signs Time Taken: 10:40 Temperature (F): 97.9 Height (in): 71 Pulse (bpm): 93 Weight (lbs): 344 Respiratory Rate (breaths/min): 20 Body Mass Index (BMI): 48 Blood Pressure (mmHg): 118/74 Reference Range: 80 - 120 mg / dl Electronic Signature(s) Signed: 11/12/2019 8:09:04 AM By: Sandre Kitty Entered By: Sandre Kitty on 09/13/2019 10:40:36

## 2019-11-21 ENCOUNTER — Other Ambulatory Visit: Payer: Self-pay | Admitting: Nurse Practitioner

## 2019-11-22 ENCOUNTER — Other Ambulatory Visit: Payer: Self-pay | Admitting: Nurse Practitioner

## 2019-11-22 DIAGNOSIS — E7800 Pure hypercholesterolemia, unspecified: Secondary | ICD-10-CM

## 2019-11-22 DIAGNOSIS — I5022 Chronic systolic (congestive) heart failure: Secondary | ICD-10-CM

## 2019-11-22 DIAGNOSIS — I471 Supraventricular tachycardia: Secondary | ICD-10-CM

## 2019-11-22 DIAGNOSIS — I259 Chronic ischemic heart disease, unspecified: Secondary | ICD-10-CM

## 2019-11-22 DIAGNOSIS — R0602 Shortness of breath: Secondary | ICD-10-CM

## 2019-11-26 ENCOUNTER — Ambulatory Visit: Payer: Medicare Other | Attending: Internal Medicine

## 2019-11-26 DIAGNOSIS — Z23 Encounter for immunization: Secondary | ICD-10-CM

## 2019-11-26 NOTE — Progress Notes (Signed)
   Covid-[facility] Vaccination [facility]  Name:  Larry [name]    MRN: [id number] DOB: Aug 06, 1950  11/26/2019  [name] was observed post Covid-19 immunization for 15 minutes without incident. He was provided with Vaccine Information Sheet and instruction to access the V-Safe system.   [name] was instructed to call 911 with any severe reactions post vaccine: [name] Kitchen Difficulty breathing  . Swelling of face and throat  . A fast heartbeat  . A bad rash all over body  . Dizziness and weakness

## 2019-11-28 ENCOUNTER — Encounter (HOSPITAL_BASED_OUTPATIENT_CLINIC_OR_DEPARTMENT_OTHER): Payer: Medicare Other | Attending: Internal Medicine | Admitting: Internal Medicine

## 2019-11-28 DIAGNOSIS — S31105S Unspecified open wound of abdominal wall, periumbilic region without penetration into peritoneal cavity, sequela: Secondary | ICD-10-CM | POA: Insufficient documentation

## 2019-11-28 DIAGNOSIS — N183 Chronic kidney disease, stage 3 unspecified: Secondary | ICD-10-CM | POA: Diagnosis not present

## 2019-11-28 DIAGNOSIS — Z7902 Long term (current) use of antithrombotics/antiplatelets: Secondary | ICD-10-CM | POA: Insufficient documentation

## 2019-11-28 DIAGNOSIS — Z6841 Body Mass Index (BMI) 40.0 and over, adult: Secondary | ICD-10-CM | POA: Diagnosis not present

## 2019-11-28 DIAGNOSIS — L98492 Non-pressure chronic ulcer of skin of other sites with fat layer exposed: Secondary | ICD-10-CM | POA: Diagnosis not present

## 2019-11-28 DIAGNOSIS — I130 Hypertensive heart and chronic kidney disease with heart failure and stage 1 through stage 4 chronic kidney disease, or unspecified chronic kidney disease: Secondary | ICD-10-CM | POA: Diagnosis not present

## 2019-11-28 DIAGNOSIS — J449 Chronic obstructive pulmonary disease, unspecified: Secondary | ICD-10-CM | POA: Insufficient documentation

## 2019-11-28 DIAGNOSIS — T8131XD Disruption of external operation (surgical) wound, not elsewhere classified, subsequent encounter: Secondary | ICD-10-CM | POA: Insufficient documentation

## 2019-11-28 DIAGNOSIS — I2510 Atherosclerotic heart disease of native coronary artery without angina pectoris: Secondary | ICD-10-CM | POA: Diagnosis not present

## 2019-11-28 DIAGNOSIS — Z85038 Personal history of other malignant neoplasm of large intestine: Secondary | ICD-10-CM | POA: Diagnosis not present

## 2019-11-28 DIAGNOSIS — Z933 Colostomy status: Secondary | ICD-10-CM | POA: Diagnosis not present

## 2019-11-28 DIAGNOSIS — Z955 Presence of coronary angioplasty implant and graft: Secondary | ICD-10-CM | POA: Diagnosis not present

## 2019-11-28 DIAGNOSIS — Z7982 Long term (current) use of aspirin: Secondary | ICD-10-CM | POA: Insufficient documentation

## 2019-11-28 DIAGNOSIS — Z87891 Personal history of nicotine dependence: Secondary | ICD-10-CM | POA: Insufficient documentation

## 2019-11-28 DIAGNOSIS — I5022 Chronic systolic (congestive) heart failure: Secondary | ICD-10-CM | POA: Diagnosis not present

## 2019-11-28 DIAGNOSIS — X58XXXS Exposure to other specified factors, sequela: Secondary | ICD-10-CM | POA: Diagnosis not present

## 2019-11-29 NOTE — Progress Notes (Signed)
[name], [name] ([id number]) Visit Report for 11/28/2019 HPI Details Patient Name: Date of Service: [facility] MS, [place] L. 11/28/2019 10:15 A M Medical Record Number: [id number] Patient Account Number: [id number] Date of Birth/Sex: Treating RN: [birth year] (69 y.o. Larry [name] Primary Care Provider: Lin Landsman Other Clinician: Referring Provider: Treating Provider/Extender: Larry [name] in Treatment: 46 History of Present Illness Location: Patient presents with a wound to left lower leg. Quality: Patient reports No Pain. Severity: Patient states wound(s) are getting worse. Duration: Patient has had the wound for < 2 weeks prior to presenting for treatment Modifying Factors: pvd HPI Description: pt smoked until 5 years ago. cancer surgery 1 year ago. has ruq stoma. cv studies last year revealed non-compressible vessels. recent pe by pcp yielded some heart abnormality and he is scheduled for stress test. 5/24 2 new wounds on rt leg. left side healed 08/12/14; several small wounds on the right lateral leg and one wound on the left leg which is new. The edema control here does not look adequate. There is also some degree of maceration around the wounds on the right lateral leg READMISSION 09/14/2018 This is a patient we had in this clinic in 2013 and then again in 2016 with wounds on his right lateral leg secondary to chronic venous insufficiency. He was cared for I believe in 2016 by Dr. Lindon Romp. The patient is here for review of 6 small wounds within a large surgical area on his abdomen. The patient had surgery for a malignant neoplasm of the sigmoid colon with a colostomy. I cannot see the date of the exact surgery in epic. In February 2018 he had an attempted colostomy reversal however he had a microperforation I believe at the anastomosis. He required a repeat laparotomy. He had an ileostomy raised at that time. I believe the surgical wound at that time was  left to close via secondary intention. The patient is unaware whether he had a wound VAC on this. In any case this almost is completely closed except he has been left with 6 small eraser head sized areas on the abdomen that have not healed. I am not exactly sure how he was how he is been dressing this. He has been followed with frequent visits in general surgery dating back at least over a year. Past medical history includes chronic systolic heart failure, stage III chronic renal failure. Malignant neoplasm of the sigmoid colon, morbid obesity. 7/31; not a lot of change 6 small wounds within a large surgical area on his abdomen. None of these are hyper granulated as opposed to last week. None of them have exposed mesh or surrounding infection. He has been using [facility] 8/14- He comes after 2 weeks, he has only 2 open wounds that I can tell this time, he has been using Hydrofera Blue 8/28; 2-week follow-up. Very superficial areas on the abdominal surgical site scar. He is using Hydrofera Blue and this seems to be doing a good job. He has not made it down to the medical supply store to see if he can get an abdominal binder or something to keep the tension off this wound area. READMISSION 01/04/2019 This is a 69 year old man we had in the clinic from July through August 2020. He had wounds in the midline abdominal scar. [Please see history from my note of 09/14/2018}. When he was here last time we used Hydrofera Blue. Things seem to be improving. He did not discharge in a healed state his  last visit here was on 8/28. He states he continued to use the [facility] and at 1 point this was very close to healing but more recently it is reopened. The wounds are basically in the same state as last time superficial friable easily bleeding wounds. Some hyper granulation. He has an abdominal binder that he got at [facility] however he says it is too tight for him to get on. Past medical history is  reviewed; he has coronary artery disease, stage III chronic renal failure heart failure with reduced ejection fraction, COPD, hypertension, obstructive sleep apnea. He was hospitalized since he was here last time for a severe pharyngitis. 11/19; the patient was in the ER on 11/16; this was for oozing from his abdominal wound. He was cauterized with quick clot gauze and epi and silver nitrate. This was felt to be venous oozing. He has very friable wounds in this area. He is using his abdominal binder. 12/3; the patient was in the ER with bleeding from the lower part of his incision in the abdomen on 11/28. Had a stitch placed to contain the bleeding area. This was just underneath his major wound. He saw general surgery yesterday and apparently we are to remove the stitch in 2 weeks. The patient is on Plavix and aspirin for coronary artery disease with stents. His bleeding was felt to be capillary. 12/17; I removed the stitch from the bleeding area that was placed last time. This came out with some difficulty. The major area almost looks like slough with an area of cellophane over it. I did not attempt any further debridement 02/28/2019. The patient has new areas superiorly and inferiorly to the major wound in the middle. The area in the middle has a nonviable surface over perhaps 60 to 70% however debridement causes uncontrolled bleeding here. He is on Plavix. We have been usin hydrogel and Prisma. 1/21; the patient has 2 superficial areas superiorly he has the major wound in the middle and a small area inferiorly in the middle of this abdominal wound. We switch to Iodoflex last time 2/4; the patient has 3 open areas. 2 of these are on the right part of this surgical wound on his abdomen and one more on the left central. The 2 on the right are hyper granulated I used silver nitrate on these. The area on the left appears to have a fibrinous surface. I attempted to debride this once he ended up in the ER.  I am reluctant to do this again. We have been using Iodoflex 2/25; he has 3 open areas small ones proximally and distally and then the larger one in the middle. This has nonviable debris over fibrinous debris over the surface. He is using Iodoflex 3/30; I follow this man monthly. He has wounds in the middle of the surgical scar in his abdomen. T oday 2 wounds which is an improvement. The major wound in the middle and one inferiorly and medially. 4/26; I follow this patient every month. This is a palliative setting. Wounds in the middle part of his surgical scar in the abdomen. He had significant amounts of denuded skin around the central area. This wipes off with Anasept and gauze. Really not any major improvement. We did get an abdominal binder for him at 1 point as usual patients do not wear these 07/16/19-Patient is back at 1 month, he has 3 small wounds in the center of his surgical scar on his abdomen, these appear to be slightly larger.  He is not using the abdominal binder as it is very uncomfortable 6/25; monthly follow-up. He is down to 2 open areas on the abdomen which appeared somewhat better today the larger area is superiorly and a small area inferiorly. There is debris on the surface of the superior 1 under illumination however very friable tissue. As opposed to what I said a month ago he is still using Iodoflex with some improvement in surface areas 7/23. Monthly follow-up. He has a central area that is still open and this abdominal scar. Not able to get on his abdominal binder that we got some time ago. He is simply growing out of it. One of her nurses suggested Montgomery straps he is going to try those. 9/2; monthly follow-up. He is using Montgomery straps. Actually his wound area looks as good as I have seen it. Still some dry flaking skin that does not appear to be robust but a lot of this is closed. Still using Iodoflex on the wound surface 10/7; we have made nice improvements in  the wound dimensions and the surface of this no longer has the adherent fibrinous debris he once had. I am reluctant to do mechanical debridement here because of the bleeding. He also has the Montgomery straps which have been very helpful I think in taking some of the torsion stress off the surface of this wound Electronic Signature(s) Signed: 11/29/2019 8:12:45 AM By: Linton Ham MD Entered By: Linton Ham on 11/28/2019 11:51:01 -------------------------------------------------------------------------------- Physical Exam Details Patient Name: Date of Service: Larry Bishop MS, [place] L. 11/28/2019 10:15 A M Medical Record Number: [id number] Patient Account Number: [id number] Date of Birth/Sex: Treating RN: 1950/06/15 (69 y.o. Larry [name] Primary Care Provider: Lin Landsman Other Clinician: Referring Provider: Treating Provider/Extender: Larry [name] in Treatment: 46 Constitutional Sitting or standing Blood Pressure is within target range for patient.. Pulse regular and within target range for patient.[name] Kitchen Respirations regular, non-labored and within target range.. Temperature is normal and within the target range for the patient.[name] Kitchen Appears in no distress. Notes Wound exam; midline abdominal scar. In the middle of this wound there is a healthy looking granulated wound although it is friable and bleeds fairly freely. Certainly a much better surface than I am used to seeing here. There is no evidence of surrounding infection. Abdomen is nontender Electronic Signature(s) Signed: 11/29/2019 8:12:45 AM By: Linton Ham MD Entered By: Linton Ham on 11/28/2019 11:51:46 -------------------------------------------------------------------------------- Physician Orders Details Patient Name: Date of Service: Larry Bishop MS, [place] L. 11/28/2019 10:15 A M Medical Record Number: [id number] Patient Account Number: [id number] Date of Birth/Sex: Treating RN: [birth year] (69  y.o. Larry [name] Primary Care Provider: Lin Landsman Other Clinician: Referring Provider: Treating Provider/Extender: Larry [name] in Treatment: [phone] Verbal / Phone Orders: No Diagnosis Coding ICD-10 Coding Code Description T81.31XD Disruption of external operation (surgical) wound, not elsewhere classified, subsequent encounter S31.105S Unspecified open wound of abdominal wall, periumbilic region without penetration into peritoneal cavity, sequela Follow-up Appointments Return appointment in 1 month. Dressing Change Frequency Wound #9 Distal Abdomen - midline Change Dressing every other day. Wound Cleansing May shower and wash wound with soap and water. - with dressing change Primary Wound Dressing Wound #9 Distal Abdomen - midline Iodoflex Secondary Dressing Wound #9 Distal Abdomen - midline Dry Gauze ABD pad Other: - montgomery binder while up during the day. may remove at night. Electronic Signature(s) Signed: 11/28/2019 6:22:31 PM By: Deon Pilling Signed: 11/29/2019 8:12:45 AM By: Linton Ham MD  Entered By: Deon Pilling on 11/28/2019 11:34:18 -------------------------------------------------------------------------------- Problem List Details Patient Name: Date of Service: Larry [name] RD L. 11/28/2019 10:15 A M Medical Record Number: [id number] Patient Account Number: [id number] Date of Birth/Sex: Treating RN: Jun 19, 1950 (69 y.o. Larry [name] Primary Care Provider: Lin Landsman Other Clinician: Referring Provider: Treating Provider/Extender: Larry [name] in Treatment: 46 Active Problems ICD-10 Encounter Code Description Active Date MDM Diagnosis T81.31XD Disruption of external operation (surgical) wound, not elsewhere classified, 01/04/2019 No Yes subsequent encounter S31.105S Unspecified open wound of abdominal wall, periumbilic region without 56/43/3295 No Yes penetration into peritoneal cavity,  sequela Inactive Problems Resolved Problems Electronic Signature(s) Signed: 11/29/2019 8:12:45 AM By: Linton Ham MD Entered By: Linton Ham on 11/28/2019 11:49:03 -------------------------------------------------------------------------------- Progress Note Details Patient Name: Date of Service: Larry Bishop MS, BENFEA RD L. 11/28/2019 10:15 A M Medical Record Number: [id number] Patient Account Number: [id number] Date of Birth/Sex: Treating RN: 15-Mar-1950 (69 y.o. Larry [name] Primary Care Provider: Lin Landsman Other Clinician: Referring Provider: Treating Provider/Extender: Larry [name] in Treatment: 46 Subjective History of Present Illness (HPI) The following HPI elements were documented for the patient's wound: Location: Patient presents with a wound to left lower leg. Quality: Patient reports No Pain. Severity: Patient states wound(s) are getting worse. Duration: Patient has had the wound for < 2 weeks prior to presenting for treatment Modifying Factors: pvd pt smoked until 5 years ago. cancer surgery 1 year ago. has ruq stoma. cv studies last year revealed non-compressible vessels. recent pe by pcp yielded some heart abnormality and he is scheduled for stress test. 5/24 2 new wounds on rt leg. left side healed 08/12/14; several small wounds on the right lateral leg and one wound on the left leg which is new. The edema control here does not look adequate. There is also some degree of maceration around the wounds on the right lateral leg READMISSION 09/14/2018 This is a patient we had in this clinic in 2013 and then again in 2016 with wounds on his right lateral leg secondary to chronic venous insufficiency. He was cared for I believe in 2016 by Dr. Lindon Romp. The patient is here for review of 6 small wounds within a large surgical area on his abdomen. The patient had surgery for a malignant neoplasm of the sigmoid colon with a colostomy. I cannot see the  date of the exact surgery in epic. In February 2018 he had an attempted colostomy reversal however he had a microperforation I believe at the anastomosis. He required a repeat laparotomy. He had an ileostomy raised at that time. I believe the surgical wound at that time was left to close via secondary intention. The patient is unaware whether he had a wound VAC on this. In any case this almost is completely closed except he has been left with 6 small eraser head sized areas on the abdomen that have not healed. I am not exactly sure how he was how he is been dressing this. He has been followed with frequent visits in general surgery dating back at least over a year. Past medical history includes chronic systolic heart failure, stage III chronic renal failure. Malignant neoplasm of the sigmoid colon, morbid obesity. 7/31; not a lot of change 6 small wounds within a large surgical area on his abdomen. None of these are hyper granulated as opposed to last week. None of them have exposed mesh or surrounding infection. He has been using [facility] 8/14- He comes after 2  weeks, he has only 2 open wounds that I can tell this time, he has been using Hydrofera Blue 8/28; 2-week follow-up. Very superficial areas on the abdominal surgical site scar. He is using Hydrofera Blue and this seems to be doing a good job. He has not made it down to the medical supply store to see if he can get an abdominal binder or something to keep the tension off this wound area. READMISSION 01/04/2019 This is a 69 year old man we had in the clinic from July through August 2020. He had wounds in the midline abdominal scar. [Please see history from my note of 09/14/2018}. When he was here last time we used Hydrofera Blue. Things seem to be improving. He did not discharge in a healed state his last visit here was on 8/28. He states he continued to use the [facility] and at 1 point this was very close to healing but more recently  it is reopened. The wounds are basically in the same state as last time superficial friable easily bleeding wounds. Some hyper granulation. He has an abdominal binder that he got at [facility] however he says it is too tight for him to get on. Past medical history is reviewed; he has coronary artery disease, stage III chronic renal failure heart failure with reduced ejection fraction, COPD, hypertension, obstructive sleep apnea. He was hospitalized since he was here last time for a severe pharyngitis. 11/19; the patient was in the ER on 11/16; this was for oozing from his abdominal wound. He was cauterized with quick clot gauze and epi and silver nitrate. This was felt to be venous oozing. He has very friable wounds in this area. He is using his abdominal binder. 12/3; the patient was in the ER with bleeding from the lower part of his incision in the abdomen on 11/28. Had a stitch placed to contain the bleeding area. This was just underneath his major wound. He saw general surgery yesterday and apparently we are to remove the stitch in 2 weeks. The patient is on Plavix and aspirin for coronary artery disease with stents. His bleeding was felt to be capillary. 12/17; I removed the stitch from the bleeding area that was placed last time. This came out with some difficulty. The major area almost looks like slough with an area of cellophane over it. I did not attempt any further debridement 02/28/2019. The patient has new areas superiorly and inferiorly to the major wound in the middle. The area in the middle has a nonviable surface over perhaps 60 to 70% however debridement causes uncontrolled bleeding here. He is on Plavix. We have been usin hydrogel and Prisma. 1/21; the patient has 2 superficial areas superiorly he has the major wound in the middle and a small area inferiorly in the middle of this abdominal wound. We switch to Iodoflex last time 2/4; the patient has 3 open areas. 2 of these are on  the right part of this surgical wound on his abdomen and one more on the left central. The 2 on the right are hyper granulated I used silver nitrate on these. The area on the left appears to have a fibrinous surface. I attempted to debride this once he ended up in the ER. I am reluctant to do this again. We have been using Iodoflex 2/25; he has 3 open areas small ones proximally and distally and then the larger one in the middle. This has nonviable debris over fibrinous debris over the surface. He is  using Iodoflex 3/30; I follow this man monthly. He has wounds in the middle of the surgical scar in his abdomen. T oday 2 wounds which is an improvement. The major wound in the middle and one inferiorly and medially. 4/26; I follow this patient every month. This is a palliative setting. Wounds in the middle part of his surgical scar in the abdomen. He had significant amounts of denuded skin around the central area. This wipes off with Anasept and gauze. Really not any major improvement. We did get an abdominal binder for him at 1 point as usual patients do not wear these 07/16/19-Patient is back at 1 month, he has 3 small wounds in the center of his surgical scar on his abdomen, these appear to be slightly larger. He is not using the abdominal binder as it is very uncomfortable 6/25; monthly follow-up. He is down to 2 open areas on the abdomen which appeared somewhat better today the larger area is superiorly and a small area inferiorly. There is debris on the surface of the superior 1 under illumination however very friable tissue. As opposed to what I said a month ago he is still using Iodoflex with some improvement in surface areas 7/23. Monthly follow-up. He has a central area that is still open and this abdominal scar. Not able to get on his abdominal binder that we got some time ago. He is simply growing out of it. One of her nurses suggested Montgomery straps he is going to try those. 9/2; monthly  follow-up. He is using Montgomery straps. Actually his wound area looks as good as I have seen it. Still some dry flaking skin that does not appear to be robust but a lot of this is closed. Still using Iodoflex on the wound surface 10/7; we have made nice improvements in the wound dimensions and the surface of this no longer has the adherent fibrinous debris he once had. I am reluctant to do mechanical debridement here because of the bleeding. He also has the Montgomery straps which have been very helpful I think in taking some of the torsion stress off the surface of this wound Objective Constitutional Sitting or standing Blood Pressure is within target range for patient.. Pulse regular and within target range for patient.[name] Kitchen Respirations regular, non-labored and within target range.. Temperature is normal and within the target range for the patient.[name] Kitchen Appears in no distress. Vitals Time Taken: 10:57 AM, Height: 71 in, Weight: 344 lbs, BMI: 48, Temperature: 98.0 F, Pulse: 97 bpm, Respiratory Rate: 18 breaths/min, Blood Pressure: 129/78 mmHg. General Notes: Wound exam; midline abdominal scar. In the middle of this wound there is a healthy looking granulated wound although it is friable and bleeds fairly freely. Certainly a much better surface than I am used to seeing here. There is no evidence of surrounding infection. Abdomen is nontender Integumentary (Hair, Skin) Wound #9 status is Open. Original cause of wound was Gradually Appeared. The wound is located on the Distal Abdomen - midline. The wound measures 2.1cm length x 1.2cm width x 0.1cm depth; 1.979cm^2 area and 0.198cm^3 volume. There is Fat Layer (Subcutaneous Tissue) exposed. There is no tunneling or undermining noted. There is a medium amount of serosanguineous drainage noted. The wound margin is flat and intact. There is large (67-100%) pink granulation within the wound bed. There is a small (1-33%) amount of necrotic tissue within the  wound bed. Assessment Active Problems ICD-10 Disruption of external operation (surgical) wound, not elsewhere classified, subsequent encounter Unspecified open  wound of abdominal wall, periumbilic region without penetration into peritoneal cavity, sequela Plan Follow-up Appointments: Return appointment in 1 month. Dressing Change Frequency: Wound #9 Distal Abdomen - midline: Change Dressing every other day. Wound Cleansing: May shower and wash wound with soap and water. - with dressing change Primary Wound Dressing: Wound #9 Distal Abdomen - midline: Iodoflex Secondary Dressing: Wound #9 Distal Abdomen - midline: Dry Gauze ABD pad Other: - montgomery binder while up during the day. may remove at night. 1. I consider changing the primary dressing to [facility] although I am going to continue with the Iodoflex for another month being the surface area came in somewhat. I think Hydrofera Blue would be a decent Contractor) Signed: 11/29/2019 8:12:45 AM By: Linton Ham MD Entered By: Linton Ham on 11/28/2019 11:52:21 -------------------------------------------------------------------------------- SuperBill Details Patient Name: Date of Service: Larry Bishop MS, [place] L. 11/28/2019 Medical Record Number: [id number] Patient Account Number: [id number] Date of Birth/Sex: Treating RN: 1950-11-10 (69 y.o. Larry [name] Primary Care Provider: Lin Landsman Other Clinician: Referring Provider: Treating Provider/Extender: Larry [name] in Treatment: 46 Diagnosis Coding ICD-10 Codes Code Description T81.31XD Disruption of external operation (surgical) wound, not elsewhere classified, subsequent encounter S31.105S Unspecified open wound of abdominal wall, periumbilic region without penetration into peritoneal cavity, sequela Facility Procedures The patient participates with Medicare or their insurance follows the Medicare Facility  Guidelines: CPT4 Code Description Modifier Quantity [id number] Jet VISIT-LEV 3 EST PT 1 Physician Procedures : CPT4 Code Description Modifier [id number] - WC PHYS LEVEL 2 - EST PT ICD-10 Diagnosis Description T81.31XD Disruption of external operation (surgical) wound, not elsewhere classified, subsequent encounter S31.105S Unspecified open wound of abdominal  wall, periumbilic region without penetration into peritoneal cavi Quantity: 1 ty, sequela Electronic Signature(s) Signed: 11/28/2019 6:22:31 PM By: Deon Pilling Signed: 11/29/2019 8:12:45 AM By: Linton Ham MD Entered By: Deon Pilling on 11/28/2019 17:39:38

## 2019-11-29 NOTE — Progress Notes (Signed)
Rojero, Kyden L. ([id number]) Visit Report for 11/28/2019 Arrival Information Details Patient Name: Date of Service: Larry Bishop MS, [place] L. 11/28/2019 10:15 A M Medical Record Number: [id number] Patient Account Number: [id number] Date of Birth/Sex: Treating RN: 01-11-1951 (69 y.o. Lorette Ang, Meta.Reding Primary Care Yassine Brunsman: Lin Landsman Other Clinician: Referring Lucinda Spells: Treating Trace Cederberg/Extender: Nelida Meuse in Treatment: 14 Visit Information History Since Last Visit Added or deleted any medications: No Patient Arrived: Ambulatory Any new allergies or adverse reactions: No Arrival Time: 10:56 Had a fall or experienced change in No Accompanied By: self activities of daily living that may affect Transfer Assistance: None risk of falls: Patient Identification Verified: Yes Signs or symptoms of abuse/neglect since last visito No Secondary Verification Process Completed: Yes Hospitalized since last visit: No Patient Requires Transmission-Based Precautions: No Implantable device outside of the clinic excluding No Patient Has Alerts: No cellular tissue based products placed in the center since last visit: Has Dressing in Place as Prescribed: Yes Pain Present Now: No Electronic Signature(s) Signed: 11/28/2019 1:11:06 PM By: Sandre Kitty Entered By: Sandre Kitty on 11/28/2019 10:57:09 -------------------------------------------------------------------------------- Clinic Level of Care Assessment Details Patient Name: Date of Service: Orpah Greek RD L. 11/28/2019 10:15 A M Medical Record Number: [id number] Patient Account Number: [id number] Date of Birth/Sex: Treating RN: February 13, 1951 (69 y.o. [name] Primary Care Paylin Hailu: Lin Landsman Other Clinician: Referring Tailey Top: Treating Aison Malveaux/Extender: Nelida Meuse in Treatment: 46 Clinic Level of Care Assessment Items TOOL 4 Quantity Score X- 1 0 Use when only  an EandM is performed on FOLLOW-UP visit ASSESSMENTS - Nursing Assessment / Reassessment X- 1 10 Reassessment of Co-morbidities (includes updates in patient status) X- 1 5 Reassessment of Adherence to Treatment Plan ASSESSMENTS - Wound and Skin A ssessment / Reassessment X - Simple Wound Assessment / Reassessment - one wound 1 5 []  - 0 Complex Wound Assessment / Reassessment - multiple wounds X- 1 10 Dermatologic / Skin Assessment (not related to wound area) ASSESSMENTS - Focused Assessment []  - 0 Circumferential Edema Measurements - multi extremities X- 1 10 Nutritional Assessment / Counseling / Intervention []  - 0 Lower Extremity Assessment (monofilament, tuning fork, pulses) []  - 0 Peripheral Arterial Disease Assessment (using hand held doppler) ASSESSMENTS - Ostomy and/or Continence Assessment and Care []  - 0 Incontinence Assessment and Management []  - 0 Ostomy Care Assessment and Management (repouching, etc.) PROCESS - Coordination of Care X - Simple Patient / Family Education for ongoing care 1 15 []  - 0 Complex (extensive) Patient / Family Education for ongoing care X- 1 10 Staff obtains Consents, Records, T Results / Process Orders est []  - 0 Staff telephones HHA, Nursing Homes / Clarify orders / etc []  - 0 Routine Transfer to another Facility (non-emergent condition) []  - 0 Routine Hospital Admission (non-emergent condition) []  - 0 New Admissions / Biomedical engineer / Ordering NPWT Apligraf, etc. , []  - 0 Emergency Hospital Admission (emergent condition) X- 1 10 Simple Discharge Coordination []  - 0 Complex (extensive) Discharge Coordination PROCESS - Special Needs []  - 0 Pediatric / Minor Patient Management []  - 0 Isolation Patient Management []  - 0 Hearing / Language / Visual special needs []  - 0 Assessment of Community assistance (transportation, D/C planning, etc.) []  - 0 Additional assistance / Altered mentation []  - 0 Support Surface(s)  Assessment (bed, cushion, seat, etc.) INTERVENTIONS - Wound Cleansing / Measurement X - Simple Wound Cleansing - one wound 1 5 []  - 0 Complex Wound Cleansing -  multiple wounds X- 1 5 Wound Imaging (photographs - any number of wounds) []  - 0 Wound Tracing (instead of photographs) X- 1 5 Simple Wound Measurement - one wound []  - 0 Complex Wound Measurement - multiple wounds INTERVENTIONS - Wound Dressings []  - 0 Small Wound Dressing one or multiple wounds X- 1 15 Medium Wound Dressing one or multiple wounds []  - 0 Large Wound Dressing one or multiple wounds []  - 0 Application of Medications - topical []  - 0 Application of Medications - injection INTERVENTIONS - Miscellaneous []  - 0 External ear exam []  - 0 Specimen Collection (cultures, biopsies, blood, body fluids, etc.) []  - 0 Specimen(s) / Culture(s) sent or taken to Lab for analysis []  - 0 Patient Transfer (multiple staff / Civil Service fast streamer / Similar devices) []  - 0 Simple Staple / Suture removal (25 or less) []  - 0 Complex Staple / Suture removal (26 or more) []  - 0 Hypo / Hyperglycemic Management (close monitor of Blood Glucose) []  - 0 Ankle / Brachial Index (ABI) - do not check if billed separately X- 1 5 Vital Signs Has the patient been seen at the hospital within the last three years: Yes Total Score: 110 Level Of Care: New/Established - Level 3 Electronic Signature(s) Signed: 11/28/2019 6:22:31 PM By: Deon Pilling Entered By: Deon Pilling on 11/28/2019 17:39:28 -------------------------------------------------------------------------------- Multi Wound Chart Details Patient Name: Date of Service: Larry Bishop MS, BENFEA RD L. 11/28/2019 10:15 A M Medical Record Number: [id number] Patient Account Number: [id number] Date of Birth/Sex: Treating RN: 06/23/1950 (69 y.o. Lorette Ang, Meta.Reding Primary Care Gennie Dib: Lin Landsman Other Clinician: Referring Hoorain Kozakiewicz: Treating Keyunna Coco/Extender: Nelida Meuse in Treatment: 46 Vital Signs Height(in): 71 Pulse(bpm): 97 Weight(lbs): 344 Blood Pressure(mmHg): 129/78 Body Mass Index(BMI): 48 Temperature(F): 98.0 Respiratory Rate(breaths/min): 18 Photos: [9:No Photos Distal Abdomen - midline] [N/A:N/A N/A] Wound Location: [9:Gradually Appeared] [N/A:N/A] Wounding Event: [9:Open Surgical Wound] [N/A:N/A] Primary Etiology: [9:Asthma, Chronic Obstructive] [N/A:N/A] Comorbid History: [9:Pulmonary Disease (COPD), Sleep Apnea, Arrhythmia, Congestive Heart Failure, Coronary Artery Disease, Hypertension, Peripheral Arterial Disease, Peripheral Venous Disease 02/28/2019] [N/A:N/A] Date Acquired: [9:39] [N/A:N/A] Weeks of Treatment: [9:Open] [N/A:N/A] Wound Status: [9:2.1x1.2x0.1] [N/A:N/A] Measurements L x W x D (cm) [9:1.979] [N/A:N/A] A (cm) : rea [9:0.198] [N/A:N/A] Volume (cm) : [9:-110.10%] [N/A:N/A] % Reduction in Area: [9:-110.60%] [N/A:N/A] % Reduction in Volume: [9:Full Thickness Without Exposed] [N/A:N/A] Classification: [9:Support Structures Medium] [N/A:N/A] Exudate Amount: [9:Serosanguineous] [N/A:N/A] Exudate Type: [9:red, brown] [N/A:N/A] Exudate Color: [9:Flat and Intact] [N/A:N/A] Wound Margin: [9:Large (67-100%)] [N/A:N/A] Granulation Amount: [9:Pink] [N/A:N/A] Granulation Quality: [9:Small (1-33%)] [N/A:N/A] Necrotic Amount: [9:Fat Layer (Subcutaneous Tissue): Yes N/A] Exposed Structures: [9:Fascia: No Tendon: No Muscle: No Joint: No Bone: No Medium (34-66%)] [N/A:N/A] Treatment Notes Electronic Signature(s) Signed: 11/28/2019 6:22:31 PM By: Deon Pilling Signed: 11/29/2019 8:12:45 AM By: Linton Ham MD Signed: 11/29/2019 8:12:45 AM By: Linton Ham MD Entered By: Linton Ham on 11/28/2019 11:49:27 -------------------------------------------------------------------------------- Multi-Disciplinary Care Plan Details Patient Name: Date of Service: Larry Bishop MS, BENFEA RD L. 11/28/2019 10:15 A M Medical  Record Number: [id number] Patient Account Number: [id number] Date of Birth/Sex: Treating RN: 01-Jun-1950 (69 y.o. [name] Primary Care Akshat Minehart: Lin Landsman Other Clinician: Referring Basim Bartnik: Treating Avyan Livesay/Extender: Nelida Meuse in Treatment: 46 Active Inactive Wound/Skin Impairment Nursing Diagnoses: Impaired tissue integrity Goals: Patient/caregiver will verbalize understanding of skin care regimen Date Initiated: 10/24/2019 Target Resolution Date: 12/20/2019 Goal Status: Active Ulcer/skin breakdown will have a volume reduction of 30% by week 4 Date Initiated: 01/04/2019 Date Inactivated: 03/28/2019 Target  Resolution Date: 04/05/2019 Unmet Reason: no change see wound Goal Status: Unmet size at week 4. Interventions: Provide education on ulcer and skin care Notes: Electronic Signature(s) Signed: 11/28/2019 6:22:31 PM By: Deon Pilling Entered By: Deon Pilling on 11/28/2019 17:38:51 -------------------------------------------------------------------------------- Pain Assessment Details Patient Name: Date of Service: Larry Bishop MS, [place] L. 11/28/2019 10:15 A M Medical Record Number: [id number] Patient Account Number: [id number] Date of Birth/Sex: Treating RN: [birth year] (69 y.o. [name] Primary Care [place]: Lin Landsman Other Clinician: Referring Lucien Budney: Treating Skyrah Krupp/Extender: Nelida Meuse in Treatment: 46 Active Problems Location of Pain Severity and Description of Pain Patient Has Paino No Site Locations Pain Management and Medication Current Pain Management: Electronic Signature(s) Signed: 11/28/2019 1:11:06 PM By: Sandre Kitty Signed: 11/28/2019 6:22:31 PM By: Deon Pilling Entered By: Sandre Kitty on 11/28/2019 10:57:34 -------------------------------------------------------------------------------- Patient/Caregiver Education Details Patient Name: Date of Service: Gladys Damme,  [place] 10/7/2021andnbsp10:15 [place] Record Number: [id number] Patient Account Number: [id number] Date of Birth/Gender: Treating RN: 02-Jun-1950 (69 y.o. [name] Primary Care Physician: Lin Landsman Other Clinician: Referring Physician: Treating Physician/Extender: Nelida Meuse in Treatment: 27 Education Assessment Education Provided To: Patient Education Topics Provided Wound/Skin Impairment: Handouts: Skin Care Do's and Dont's Methods: Explain/Verbal Responses: Reinforcements needed Electronic Signature(s) Signed: 11/28/2019 6:22:31 PM By: Deon Pilling Entered By: Deon Pilling on 11/28/2019 17:39:01 -------------------------------------------------------------------------------- Wound Assessment Details Patient Name: Date of Service: Larry Bishop MS, BENFEA RD L. 11/28/2019 10:15 A M Medical Record Number: [id number] Patient Account Number: [id number] Date of Birth/Sex: Treating RN: Jun 24, 1950 (69 y.o. Lorette Ang, Tammi Klippel Primary Care Jeffre Enriques: Lin Landsman Other Clinician: Referring Alexismarie Flaim: Treating Deryl Ports/Extender: Nelida Meuse in Treatment: 46 Wound Status Wound Number: [id number] Primary Open Surgical Wound Etiology: Wound Location: Distal Abdomen - midline Wound Open Wounding Event: Gradually Appeared Status: Date Acquired: 02/28/2019 Comorbid Asthma, Chronic Obstructive Pulmonary Disease (COPD), Sleep Weeks Of Treatment: 39 History: Apnea, Arrhythmia, Congestive Heart Failure, Coronary Artery Clustered Wound: No Disease, Hypertension, Peripheral Arterial Disease, Peripheral Venous Disease Wound Measurements Length: (cm) 2.1 Width: (cm) 1.2 Depth: (cm) 0.1 Area: (cm) 1.979 Volume: (cm) 0.198 % Reduction in Area: -110.1% % Reduction in Volume: -110.6% Epithelialization: Medium (34-66%) Tunneling: No Undermining: No Wound Description Classification: Full Thickness Without Exposed Support Structures Wound  Margin: Flat and Intact Exudate Amount: Medium Exudate Type: Serosanguineous Exudate Color: red, brown Foul Odor After Cleansing: No Slough/Fibrino Yes Wound Bed Granulation Amount: Large (67-100%) Exposed Structure Granulation Quality: Pink Fascia Exposed: No Necrotic Amount: Small (1-33%) Fat Layer (Subcutaneous Tissue) Exposed: Yes Tendon Exposed: No Muscle Exposed: No Joint Exposed: No Bone Exposed: No Electronic Signature(s) Signed: 11/28/2019 6:22:31 PM By: Deon Pilling Signed: 11/29/2019 5:41:32 PM By: Levan Hurst RN, BSN Entered By: Levan Hurst on 11/28/2019 11:04:02 -------------------------------------------------------------------------------- Vitals Details Patient Name: Date of Service: Larry Bishop MS, BENFEA RD L. 11/28/2019 10:15 A M Medical Record Number: [id number] Patient Account Number: [id number] Date of Birth/Sex: Treating RN: [birth year] (69 y.o. [name] Primary Care Roquel Burgin: Lin Landsman Other Clinician: Referring Jonthan Leite: Treating Lyle Leisner/Extender: Nelida Meuse in Treatment: 46 Vital Signs Time Taken: 10:57 Temperature (F): 98.0 Height (in): 71 Pulse (bpm): 97 Weight (lbs): 344 Respiratory Rate (breaths/min): 18 Body Mass Index (BMI): 48 Blood Pressure (mmHg): 129/78 Reference Range: 80 - 120 mg / dl Electronic Signature(s) Signed: 11/28/2019 1:11:06 PM By: Sandre Kitty Entered By: Sandre Kitty on 11/28/2019 10:57:26

## 2019-12-19 NOTE — Progress Notes (Signed)
CARDIOLOGY OFFICE NOTE  Date:  01/01/2020    Larry [name] Date of Birth: 28-Dec-1950 Medical Record #[id number]  PCP:  Lin Landsman, MD  Cardiologist:  [place]    Chief Complaint  Patient presents with  . Follow-up    History of Present Illness: Larry [name] is a 69 y.o. male who presents today for a follow up visit. Seen for Dr. Rayann Heman.Primarily follows with me.  He has an ischemic CM with severe diffuse 3VD and prior stents to the RCA in 1657,[id number] combinedsystolic and diastolic dysfunction, HTN, HLD, OSA on CPAP, morbidobesityand chronic massive lower extremity edema. Has had a history of long standing atrial tach that has been treated with beta blockerand CCB therapy.Found to have anemia due to colon cancer back in 2014 - he had surgery that was complicated by cardiac arrest with prolonged QT and subsequent torsades.   In 2016 he wanted to attempt having his colostomy reversed - Myoview was updated and was high risk - ended up being cathed by Dr. Burt Knack and had PCI to the LAD with DES x 2 and placed on DAPT.   He has had intermittent issues with elevation in his HR/atrial tach - he is typically not aware of this. He remains on both beta blocker and calcium channel blocker therapy. He had attempt at reversal of his colostomy in early 2018 - this was not successful and he had an anastomotic leak - respiratory failure and a prolonged stay at [facility].  I have seen him several times since.He has had prior admissions - medicines get cut backperiodicallyand we inevitably put him back on due to the recurrence of atrial tachycardia - I have referred him back to Dr. Rayann Heman to review his medical therapy and to make sure we were all on track with the current plan of care. Has chronic and massive lower extremity edema - options are very limited and CCB will have to be continued.  He has gotten more and more sedentary. Weight unfortunately  continues to climb. His swelling is chronic and massive. Continues to be followed in the wound clinic. Fortunately not much in the way of cardiac symptoms. Last seen by me in July - remained pretty much home bound.   Comes in today. Here alone. He feels like he is doing ok. He has been vaccinated for COVID 19. He is pretty much home bound and does not get out. Swelling remains massive - unchanged. No awareness of his elevated HR. Not dizzy or lightheaded. No passing out. No chest pain. Breathing is stable. Feels ok on his medicines.   Past Medical History:  Diagnosis Date  . Arthritis   . Atrial tachycardia ([place])    managed on beta blocker therapy  . Childhood asthma    "went away after I was 16"  . Chronic combined systolic and diastolic CHF, NYHA class 3 (HCC)    has diastolic heart failure grade 1; EF is 45 to 50% per echo 05/2011; EF 41% by Myoview 2016  . CKD (chronic kidney disease) stage 3, GFR 30-59 ml/min (HCC)   . Colon cancer ([place]) 2015   MSI high; IHC loss of MLH1 and PMS2; BRAF negative; Negative methylation  . COPD (chronic obstructive pulmonary disease) ([place])   . Coronary artery disease   . Family history of ovarian cancer   . Hypercholesteremia   . Hypertension   . Noncompliance   . NSVT (nonsustained ventricular tachycardia) (HCC)    beta blocker restarted  .  Obesity   . OSA on CPAP    used nightly, pt does not know settings  . Peripheral arterial disease (HCC)    4.2 cm thoracic aortic aneurysm per chest ct 11-14-15 epic  . S/P colostomy ([place])    2014  . Thrombocytopenia ([place])   . Torsades de pointes ([place])    X 2 episodes during hospital visit 12'14"electrolyte imbalance"- "Shocked"    Past Surgical History:  Procedure Laterality Date  . COLON SURGERY    . COLONOSCOPY N/A 02/08/2013   Procedure: COLONOSCOPY;  Surgeon: [name], MD;  Location: [place];  Service: Endoscopy;  Laterality: N/A;  . COLONOSCOPY WITH PROPOFOL N/A 05/09/2014   Procedure:  COLONOSCOPY WITH PROPOFOL;  Surgeon: [name], MD;  Location: [facility];  Service: Endoscopy;  Laterality: N/A;  . COLONOSCOPY WITH PROPOFOL N/A 01/08/2016   Procedure: COLONOSCOPY WITH PROPOFOL;  Surgeon: [name], MD;  [facility];  Service: Endoscopy;  Laterality: N/A;  . COLONOSCOPY WITH PROPOFOL N/A 12/28/2018   Procedure: COLONOSCOPY WITH PROPOFOL;  Surgeon: [name], MD;  [facility];  Service: Endoscopy;  Laterality: N/A;  . COLOSTOMY N/A 04/19/2016   Procedure: COLOSTOMY;  Surgeon: Judeth Horn, MD;  Location: [place];  Service: General;  Laterality: N/A;  . COLOSTOMY REVERSAL  04/12/2016  . COLOSTOMY REVERSAL N/A 04/12/2016   Procedure: COLOSTOMY REVERSAL;  Surgeon: Judeth Horn, MD;  Location: [place];  Service: General;  Laterality: N/A;  . CORONARY ANGIOPLASTY WITH STENT PLACEMENT  11/12/2008; 06/11/2014   stent x 2 to RCA; stent x 2 to LAD  . CORONARY ANGIOPLASTY WITH STENT PLACEMENT  06/11/2014   m-LAD 3.5 x 16 mm Synergy DES, d-LAD  2.25 x 16 mm Synergy DES  . CYSTOSCOPY W/ URETERAL STENT PLACEMENT Left 06/30/2017   Procedure: CYSTOSCOPY WITH RETROGRADE PYELOGRAM/URETERAL STENT PLACEMENT;  Surgeon: Lucas Mallow, MD;  Location: [place];  Service: Urology;  Laterality: Left;  . CYSTOSCOPY WITH LITHOLAPAXY N/A 08/02/2017   Procedure: CYSTOSCOPY WITH BLADDER STONE EXTRACTION;  Surgeon: Lucas Mallow, MD;  [facility];  Service: Urology;  Laterality: N/A;  . CYSTOSCOPY WITH RETROGRADE PYELOGRAM, URETEROSCOPY AND STENT PLACEMENT Left 08/02/2017   Procedure: CYSTOSCOPY WITH LEFT RETROGRADE PYELOGRAM, URETEROSCOPY AND STENT EXCHANGE;  Surgeon: Lucas Mallow, MD;  [facility];  Service: Urology;  Laterality: Left;  . ESOPHAGOGASTRODUODENOSCOPY N/A 02/08/2013   Procedure: ESOPHAGOGASTRODUODENOSCOPY (EGD);  Surgeon: [name], MD;  Location: [facility];  Service: Endoscopy;  Laterality: N/A;  . FLEXIBLE SIGMOIDOSCOPY N/A 02/19/2016    Procedure: FLEXIBLE SIGMOIDOSCOPY;  Surgeon: [name], MD;  [facility];  Service: Endoscopy;  Laterality: N/A;  . HOLMIUM LASER APPLICATION Left 06/04/2438   Procedure: HOLMIUM LASER APPLICATION;  Surgeon: Lucas Mallow, MD;  [facility];  Service: Urology;  Laterality: Left;  . LAPAROTOMY N/A 02/12/2013   Procedure: EXPLORATORY LAPAROTOMY PARTIAL COLECTOMY WITH COLOSTOMY;  Surgeon: [name], MD;  Location: [place];  Service: General;  Laterality: N/A;  . LAPAROTOMY N/A 02/18/2013   Procedure: EXPLORATORY LAPAROTOMY/Closure of Wound;  Surgeon: [name], MD;  Location: [place];  Service: General;  Laterality: N/A;  . LAPAROTOMY N/A 04/19/2016   Procedure: EXPLORATORY LAPAROTOMY, REPAIR OF ANASTAMOTIC LEAK;  Surgeon: Judeth Horn, MD;  Location: [place];  Service: General;  Laterality: N/A;  . LEFT HEART CATHETERIZATION WITH CORONARY ANGIOGRAM N/A 06/11/2014   Procedure: LEFT HEART CATHETERIZATION WITH CORONARY ANGIOGRAM;  Surgeon: [name], MD; CFX  calcified, 30-40 percent, RCA calcified, 40/50/40%, PDA diffuse disease, LAD 40/75/90% s/p DES 2   . POLYPECTOMY  12/28/2018   Procedure: POLYPECTOMY;  Surgeon: [name], MD;  [facility];  Service: Endoscopy;;     Medications: Current Meds  Medication Sig  . aspirin EC 81 MG tablet Take 1 tablet (81 mg total) by mouth daily.  [name] Kitchen atorvastatin (LIPITOR) 40 MG tablet TAKE 1 TABLET BY MOUTH EVERY DAY  . clopidogrel (PLAVIX) 75 MG tablet TAKE 1 TABLET (75 MG TOTAL) BY MOUTH DAILY WITH BREAKFAST.  [name] Kitchen diltiazem (CARDIZEM) 60 MG tablet TAKE 1 & 1/2 TABLET BY MOUTH 3 TIMES DAILY.  . ferrous sulfate 325 (65 FE) MG tablet Take 1 tablet (325 mg total) by mouth daily with breakfast.  . furosemide (LASIX) 40 MG tablet TAKE 1.5 TABLETS (60 MG TOTAL) BY MOUTH DAILY.  [name] Kitchen levalbuterol (XOPENEX HFA) 45 MCG/ACT inhaler INHALE 2 PUFFS BY MOUTH EVERY 4 HOURS AS NEEDED FOR WHEEZING  . metoprolol tartrate (LOPRESSOR) 25 MG  tablet TAKE 1.5 TABLETS (37.5 MG TOTAL) BY MOUTH 2 (TWO) TIMES DAILY.  . Multiple Vitamin (MULTI-VITAMIN DAILY) TABS Take by mouth.  . nitroGLYCERIN (NITROSTAT) 0.4 MG SL tablet Place 0.4 mg under the tongue every 5 (five) minutes as needed for chest pain.  . simethicone (GAS-X EXTRA STRENGTH) 125 MG chewable tablet Chew 125 mg by mouth 3 (three) times daily.  [name] Kitchen spironolactone (ALDACTONE) 25 MG tablet TAKE 1/2 TABLET BY MOUTH EVERY DAY  . tamsulosin (FLOMAX) 0.4 MG CAPS capsule TAKE 1 CAPSULE BY MOUTH EVERY DAY     Allergies: No Known Allergies  Social History: The patient  reports that he quit smoking about 11 years ago. His smoking use included cigarettes. He has a 38.00 pack-year smoking history. He has never used smokeless tobacco. He reports current alcohol use. He reports that he does not use drugs.   Family History: The patient's family history includes Dementia in his mother; Diabetes in his father; Heart disease in his father; Hypertension in his father and mother; Leukemia in his maternal uncle; Liver cancer in his maternal grandmother; Parkinsonism in his mother; Prostate cancer in his maternal uncle.   Review of Systems: Please see the history of present illness.   All other systems are reviewed and negative.   Physical Exam: VS:  BP 110/70   Pulse (!) 106   Ht 5' 11"  (1.803 m)   Wt (!) 362 lb 3.2 oz (164.3 kg)   SpO2 96%   BMI 50.52 kg/m  .  BMI Body mass index is 50.52 kg/m.  Wt Readings from Last 3 Encounters:  01/01/20 (!) 362 lb 3.2 oz (164.3 kg)  09/19/19 (!) 362 lb 9.6 oz (164.5 kg)  09/11/19 (!) 363 lb (164.7 kg)    General: Pleasant. Morbidly obese. Alert and in no acute distress.   Cardiac: Regular rate and rhythm. His HR slowed to 88 on my exam. No murmurs, rubs, or gallops. He has chronic/massive bilateral edema.  Respiratory:  Lungs are clear to auscultation bilaterally with normal work of breathing.  GI: Soft and nontender.  MS: No deformity or  atrophy. Gait and ROM intact.  Skin: Warm and dry. Color is normal.  Neuro:  Strength and sensation are intact and no gross focal deficits noted.  Psych: Alert, appropriate and with normal affect.   LABORATORY DATA:  EKG:  EKG is ordered today.  Personally reviewed by me. This demonstrates sinus tach - 1st degree AV block - HR is  106 - (done after walking in from parking lot).  Lab Results  Component Value Date   WBC 5.4 09/11/2019   HGB 12.9 (L) 09/11/2019   HCT 39.9 09/11/2019   PLT 135 (L) 09/11/2019   GLUCOSE 88 09/11/2019   CHOL 152 05/08/2019   TRIG 154 (H) 05/08/2019   HDL 58 05/08/2019   LDLCALC 68 05/08/2019   ALT 18 05/08/2019   AST 16 05/08/2019   NA 141 09/11/2019   K 4.0 09/11/2019   CL 103 09/11/2019   CREATININE 2.09 (H) 09/11/2019   BUN 18 09/11/2019   CO2 19 (L) 09/11/2019   TSH 1.630 09/11/2018   INR 1.31 06/30/2017   HGBA1C 5.9 (H) 05/14/2016     BNP (last 3 results) No results for input(s): BNP in the last 8760 hours.  ProBNP (last 3 results) No results for input(s): PROBNP in the last 8760 hours.   Other Studies Reviewed Today:  EchoStudy Conclusions2/2018  - Left ventricle: The cavity size was normal. Wall thickness was increased in a pattern of moderate LVH. Systolic function was normal. The estimated ejection fraction was in the range of 55% to 60%. Wall motion was normal; there were no regional wall motion abnormalities. The study is not technically sufficient to allow evaluation of LV diastolic function. - Aortic root: The aortic root was mildly dilated. - Mitral valve: Calcified annulus.  Impressions:  - Technically difficult; definity used; normal LV function; moderate LVH.   MyoviewStudy Highlights12/2017   Nuclear stress EF: 58%.  There was no ST segment deviation noted during stress.  There is a large defect of severe severity present in the basal inferior, mid inferior, apical septal, apical  inferior and apex location. The defect is non-reversible. No ischemia noted. Tthis is consistent with diaphragmatic attenuation artifact vs. Scar.  This is a low risk study.  The left ventricular ejection fraction is normal (55-65%).     Procedure: Left Heart Cath, [facility] Coronary Angiography, PTCA and stenting of the mid and distal LAD April 2016.  Coronary angiography: Left mainstem: The left mainstem is patent. The vessel divides into the LAD and left circumflex. There is no significant stenosis, but there is mild irregularity.  Left anterior descending (LAD): The LAD is moderately calcified. The vessel is severely diseased. The proximal LAD is patent with 30-40% stenosis after the second septal perforator. There is diffuse calcification. The diagonal branches are small. The mid LAD has an eccentric 75% stenosis at the origin of the second diagonal branch. Beyond that area the mid and distal LAD are diffusely diseased. There is another severe stenosis in the apical LAD of 90%.  Left circumflex (LCx): The left circumflex is calcified. The vessel is patent with mild irregularity. There are no high-grade stenoses identified. There are scattered 30-40% stenoses throughout the proximal and mid circumflex as well as the first OM branch.  Right coronary artery (RCA): The RCA is dominant. The vessel is heavily calcified. The stented segments in the mid and distal RCA are patent. The proximal vessel has 30-40% stenosis. The mid vessel has 30-40% stenosis. The distal vessel is patent with 50-60% stenosis involving the origin of the PDA. The PDA is diffusely diseased.  Left ventriculography: Deferred because of chronic kidney disease. By nuclear scan the LVEF is 41%.  PCI Note: Following the diagnostic procedure, the decision was made to proceed with PCI of the mid and apical LAD. The right coronary artery had patent stents in the left circumflex had nonobstructive disease. The  LAD is a diffusely diseased vessel and the mid and distal vessel would be a poor surgical targets. I felt that PCI was the best option in this patient with a high risk nuclear scan demonstrating anteroapical and anterolateral ischemia. He will require colostomy reversal, so I planned on treating him with Synergy drug-eluting stents which had a biodegradable polymer and potentially can allow for earlier interruption of dual antiplatelet therapy. The patient was loaded with Plavix 600 mg. Weight-based bivalirudin was given for anticoagulation. Once a therapeutic ACT was achieved, a 6 [place] XB LAD 3.5 cm guide catheter was inserted. A cougar coronary guidewire was used to cross the lesion in the apical LAD. The lesion was predilated with a 2.5 x 12 mm balloon. The same balloon was used to dilate the mid lesion. The apical lesion was then stented with a 2.25 x 16 mm Synergy DES. The stent was postdilated with a 2.5 mm noncompliant balloon. The mid lesion was then stented with a 3.5 x 16 mm Synergy DES. That stent was postdilated with a 3.75 mm noncompliant balloon. Following PCI, there was 0% residual stenosis and TIMI-3 flow. Final angiography confirmed an excellent result. The patient tolerated the procedure well. There were no immediate procedural complications. A TR band was used for radial hemostasis. The patient was transferred to the post catheterization recovery area for further monitoring.  PCI Data: Lesion 1: Vessel - LAD/Segment - distal/apical Percent Stenosis (pre) 90 TIMI-flow 3 Stent 2.25 x 16 mm Synergy DES Percent Stenosis (post) 0 TIMI-flow (post) 3  Lesion 2: Vessel - LAD/Segment - mid Percent Stenosis (pre) 75 TIMI-flow 3 Stent 3.5 x 16 mm Synergy DES Percent Stenosis (post) 0 TIMI-flow (post) 3  Estimated Blood Loss: Minimal  Final Conclusions:  1. Two-vessel coronary artery disease with continued patency of the stented segments in the right coronary artery and  severe stenoses of the mid and apical LAD 2. Mild diffuse nonobstructive left circumflex stenosis 3. Successful PCI of the LAD using 2 drug-eluting stents   Recommendations:  Dual antiplatelet therapy for at least 6 months. Would be reasonable to consider interruption of aspirin and Plavix at 6 months for colostomy reversal.  [name] MD, [facility] 06/11/2014, 10:06 AM   Assessment / Plan:  1. Atrial tachycardia along with sinus tachycardia - chronic finding for many years - he has never been symptomatic. He is managed medically and has limited options otherwise - he has been back to see JA who agrees with his current management. He is not a candidate for EP procedures or AAD therapy. His HR has slowed after resting here in the office to the high 80's - no changes made today.   2. Known CAD - prior PCI to the RCA and LAD in 2016 with DES x 2 - remains on chronic DAPT - no worrisome chest pain - he is very sedentary and CV risk factor modification is challenging and will not obtainable.   3. Morbid obesity - crux of many of his issues - very sad situation that has not really changed. Fortunately, he feels pretty good.   4. Prior colon cancer - I found when anemic many years ago - he has ileostomy in place. Had failed attempt at reversal that led to long hospitalization and stay at [facility].   5. Chronic combined systolic and diastolic HF - EF normal - edema is massive and chronic - unfortunately, not too much to add or improve on. Not able to cut his dose of CCB  back.   6. CKD - lab today.   7. HTN - BP ok today - no changes made.   8. HLD - on statin - lab today.   9. Prior GU procedures - I have typically refilled his Flomax - he does not see GU - really does not even see PCP - would want to avoid urinary retention that could lead to recurrent UTI and potential for sepsis - this would be life threatening to him.   10. Health maintenance - high dose flu shot today.   Current  medicines are reviewed with the patient today.  The patient does not have concerns regarding medicines other than what has been noted above.  The following changes have been made:  See above.  Labs/ tests ordered today include:    Orders Placed This Encounter  Procedures  . Basic metabolic panel  . CBC  . Hepatic function panel  . Lipid panel  . EKG 12-Lead     Disposition:   FU with Renee with Dr. Rayann Heman in 4 months. He is aware that I am leaving the practice in February. We will get his labs today. Flu shot today. Overall, he is holding his own.    Patient is agreeable to this plan and will call if any problems develop in the interim.   SignedTruitt Merle, NP  01/01/2020 10:14 AM  [facility] [address] [place], [place]  [address] Phone: [phone] Fax: [phone]

## 2020-01-01 ENCOUNTER — Other Ambulatory Visit: Payer: Self-pay

## 2020-01-01 ENCOUNTER — Ambulatory Visit (INDEPENDENT_AMBULATORY_CARE_PROVIDER_SITE_OTHER): Payer: Medicare Other | Admitting: Nurse Practitioner

## 2020-01-01 ENCOUNTER — Encounter: Payer: Self-pay | Admitting: Nurse Practitioner

## 2020-01-01 VITALS — BP 110/70 | HR 106 | Ht 71.0 in | Wt 362.2 lb

## 2020-01-01 DIAGNOSIS — I471 Supraventricular tachycardia: Secondary | ICD-10-CM

## 2020-01-01 DIAGNOSIS — Z23 Encounter for immunization: Secondary | ICD-10-CM

## 2020-01-01 DIAGNOSIS — I5022 Chronic systolic (congestive) heart failure: Secondary | ICD-10-CM | POA: Diagnosis not present

## 2020-01-01 DIAGNOSIS — I259 Chronic ischemic heart disease, unspecified: Secondary | ICD-10-CM | POA: Diagnosis not present

## 2020-01-01 DIAGNOSIS — E7800 Pure hypercholesterolemia, unspecified: Secondary | ICD-10-CM

## 2020-01-01 LAB — BASIC METABOLIC PANEL
BUN/Creatinine Ratio: 9 — ABNORMAL LOW (ref 10–24)
BUN: 22 mg/dL (ref 8–27)
CO2: 23 mmol/L (ref 20–29)
Calcium: 9.9 mg/dL (ref 8.6–10.2)
Chloride: 102 mmol/L (ref 96–106)
Creatinine, Ser: 2.4 mg/dL — ABNORMAL HIGH (ref 0.76–1.27)
GFR calc Af Amer: 31 mL/min/{1.73_m2} — ABNORMAL LOW (ref >59)
GFR calc non Af Amer: 27 mL/min/{1.73_m2} — ABNORMAL LOW (ref >59)
Glucose: 96 mg/dL (ref 65–99)
Potassium: 4.1 mmol/L (ref 3.5–5.2)
Sodium: 139 mmol/L (ref 134–144)

## 2020-01-01 LAB — CBC
Hematocrit: 38.8 % (ref 37.5–51.0)
Hemoglobin: 12.8 g/dL — ABNORMAL LOW (ref 13.0–17.7)
MCH: 29.5 pg (ref 26.6–33.0)
MCHC: 33 g/dL (ref 31.5–35.7)
MCV: 89 fL (ref 79–97)
Platelets: 133 10*3/uL — ABNORMAL LOW (ref 150–450)
RBC: 4.34 x10E6/uL (ref 4.14–5.80)
RDW: 13.7 % (ref 11.6–15.4)
WBC: 5.5 10*3/uL (ref 3.4–10.8)

## 2020-01-01 LAB — HEPATIC FUNCTION PANEL
ALT: 21 IU/L (ref 0–44)
AST: 18 IU/L (ref 0–40)
Albumin: 4.5 g/dL (ref 3.8–4.8)
Alkaline Phosphatase: 132 IU/L — ABNORMAL HIGH (ref 44–121)
Bilirubin Total: 1.2 mg/dL (ref 0.0–1.2)
Bilirubin, Direct: 0.25 mg/dL (ref 0.00–0.40)
Total Protein: 7.2 g/dL (ref 6.0–8.5)

## 2020-01-01 LAB — LIPID PANEL
Chol/HDL Ratio: 2.8 ratio (ref 0.0–5.0)
Cholesterol, Total: 161 mg/dL (ref 100–199)
HDL: 58 mg/dL (ref >39)
LDL Chol Calc (NIH): 76 mg/dL (ref 0–99)
Triglycerides: 158 mg/dL — ABNORMAL HIGH (ref 0–149)
VLDL Cholesterol Cal: 27 mg/dL (ref 5–40)

## 2020-01-01 NOTE — Patient Instructions (Addendum)
After Visit Summary:  We will be checking the following labs today - BMET, CBC, HPF and Lipids   Medication Instructions:    Continue with your current medicines.   Flu shot today   If you need a refill on your cardiac medications before your next appointment, please call your pharmacy.     Testing/Procedures To Be Arranged:  N/A  Follow-Up:   See Joseph Art in March    At [facility], you and your health needs are our priority.  As part of our continuing mission to provide you with exceptional heart care, we have created designated Provider Care Teams.  These Care Teams include your primary Cardiologist (physician) and Advanced Practice Providers (APPs -  Physician Assistants and Nurse Practitioners) who all work together to provide you with the care you need, when you need it.  Special Instructions:  . Stay safe, wash your hands for at least 20 seconds and wear a mask when needed.  . It was good to talk with you today.    Call the [place] office at [phone] if you have any questions, problems or concerns.

## 2020-01-02 ENCOUNTER — Other Ambulatory Visit: Payer: Self-pay | Admitting: *Deleted

## 2020-01-02 DIAGNOSIS — N1831 Chronic kidney disease, stage 3a: Secondary | ICD-10-CM

## 2020-01-06 ENCOUNTER — Other Ambulatory Visit: Payer: Self-pay

## 2020-01-06 ENCOUNTER — Encounter (HOSPITAL_BASED_OUTPATIENT_CLINIC_OR_DEPARTMENT_OTHER): Payer: Medicare Other | Attending: Internal Medicine | Admitting: Internal Medicine

## 2020-01-06 DIAGNOSIS — I5022 Chronic systolic (congestive) heart failure: Secondary | ICD-10-CM | POA: Insufficient documentation

## 2020-01-06 DIAGNOSIS — Z6841 Body Mass Index (BMI) 40.0 and over, adult: Secondary | ICD-10-CM | POA: Diagnosis not present

## 2020-01-06 DIAGNOSIS — I739 Peripheral vascular disease, unspecified: Secondary | ICD-10-CM | POA: Insufficient documentation

## 2020-01-06 DIAGNOSIS — X58XXXA Exposure to other specified factors, initial encounter: Secondary | ICD-10-CM | POA: Insufficient documentation

## 2020-01-06 DIAGNOSIS — T8131XA Disruption of external operation (surgical) wound, not elsewhere classified, initial encounter: Secondary | ICD-10-CM | POA: Diagnosis not present

## 2020-01-06 DIAGNOSIS — I2510 Atherosclerotic heart disease of native coronary artery without angina pectoris: Secondary | ICD-10-CM | POA: Insufficient documentation

## 2020-01-06 DIAGNOSIS — I130 Hypertensive heart and chronic kidney disease with heart failure and stage 1 through stage 4 chronic kidney disease, or unspecified chronic kidney disease: Secondary | ICD-10-CM | POA: Diagnosis not present

## 2020-01-06 DIAGNOSIS — T8131XD Disruption of external operation (surgical) wound, not elsewhere classified, subsequent encounter: Secondary | ICD-10-CM | POA: Insufficient documentation

## 2020-01-06 DIAGNOSIS — I872 Venous insufficiency (chronic) (peripheral): Secondary | ICD-10-CM | POA: Insufficient documentation

## 2020-01-06 DIAGNOSIS — Z87891 Personal history of nicotine dependence: Secondary | ICD-10-CM | POA: Insufficient documentation

## 2020-01-06 DIAGNOSIS — N183 Chronic kidney disease, stage 3 unspecified: Secondary | ICD-10-CM | POA: Insufficient documentation

## 2020-01-06 DIAGNOSIS — Z86711 Personal history of pulmonary embolism: Secondary | ICD-10-CM | POA: Insufficient documentation

## 2020-01-06 DIAGNOSIS — Z7982 Long term (current) use of aspirin: Secondary | ICD-10-CM | POA: Diagnosis not present

## 2020-01-06 DIAGNOSIS — Z7902 Long term (current) use of antithrombotics/antiplatelets: Secondary | ICD-10-CM | POA: Diagnosis not present

## 2020-01-06 NOTE — Addendum Note (Signed)
Addended by: Gaetano Net on: 01/06/2020 11:13 AM   Modules accepted: Orders

## 2020-01-07 NOTE — Progress Notes (Addendum)
Teng, Jonnie L. ([id number]) Visit Report for 01/06/2020 Arrival Information Details Patient Name: Date of Service: Lyman Bishop MS, [place] L. 01/06/2020 10:15 A M Medical Record Number: [id number] Patient Account Number: [id number] Date of Birth/Sex: Treating RN: [birth year] (69 y.o. Ulyses Amor, Vaughan Basta Primary Care Tova Vater: Lin Landsman Other Clinician: Referring Alveda Vanhorne: Treating Aira Sallade/Extender: Nelida Meuse in Treatment: 52 Visit Information History Since Last Visit Added or deleted any medications: No Patient Arrived: Ambulatory Any new allergies or adverse reactions: No Arrival Time: 10:19 Had a fall or experienced change in No Accompanied By: self activities of daily living that may affect Transfer Assistance: None risk of falls: Patient Identification Verified: Yes Signs or symptoms of abuse/neglect since last visito No Secondary Verification Process Completed: Yes Hospitalized since last visit: No Patient Requires Transmission-Based Precautions: No Implantable device outside of the clinic excluding No Patient Has Alerts: No cellular tissue based products placed in the center since last visit: Has Dressing in Place as Prescribed: Yes Pain Present Now: No Electronic Signature(s) Signed: 01/07/2020 5:21:16 PM By: Baruch Gouty RN, BSN Entered By: Baruch Gouty on 01/06/2020 10:20:40 -------------------------------------------------------------------------------- Clinic Level of Care Assessment Details Patient Name: Date of Service: [facility] MS, Max RD L. 01/06/2020 10:15 A M Medical Record Number: [id number] Patient Account Number: [id number] Date of Birth/Sex: Treating RN: 10-May-1950 (69 y.o. [name] Primary Care Ronald Vinsant: Lin Landsman Other Clinician: Referring Darria Corvera: Treating Hollie Wojahn/Extender: Nelida Meuse in Treatment: 52 Clinic Level of Care Assessment Items TOOL 4 Quantity Score X- 1  0 Use when only an EandM is performed on FOLLOW-UP visit ASSESSMENTS - Nursing Assessment / Reassessment X- 1 10 Reassessment of Co-morbidities (includes updates in patient status) X- 1 5 Reassessment of Adherence to Treatment Plan ASSESSMENTS - Wound and Skin A ssessment / Reassessment X - Simple Wound Assessment / Reassessment - one wound 1 5 []  - 0 Complex Wound Assessment / Reassessment - multiple wounds []  - 0 Dermatologic / Skin Assessment (not related to wound area) ASSESSMENTS - Focused Assessment []  - 0 Circumferential Edema Measurements - multi extremities []  - 0 Nutritional Assessment / Counseling / Intervention []  - 0 Lower Extremity Assessment (monofilament, tuning fork, pulses) []  - 0 Peripheral Arterial Disease Assessment (using hand held doppler) ASSESSMENTS - Ostomy and/or Continence Assessment and Care []  - 0 Incontinence Assessment and Management []  - 0 Ostomy Care Assessment and Management (repouching, etc.) PROCESS - Coordination of Care X - Simple Patient / Family Education for ongoing care 1 15 []  - 0 Complex (extensive) Patient / Family Education for ongoing care X- 1 10 Staff obtains Programmer, systems, Records, T Results / Process Orders est []  - 0 Staff telephones HHA, Nursing Homes / Clarify orders / etc []  - 0 Routine Transfer to another Facility (non-emergent condition) []  - 0 Routine Hospital Admission (non-emergent condition) []  - 0 New Admissions / Biomedical engineer / Ordering NPWT Apligraf, etc. , []  - 0 Emergency Hospital Admission (emergent condition) X- 1 10 Simple Discharge Coordination []  - 0 Complex (extensive) Discharge Coordination PROCESS - Special Needs []  - 0 Pediatric / Minor Patient Management []  - 0 Isolation Patient Management []  - 0 Hearing / Language / Visual special needs []  - 0 Assessment of Community assistance (transportation, D/C planning, etc.) []  - 0 Additional assistance / Altered mentation []  -  0 Support Surface(s) Assessment (bed, cushion, seat, etc.) INTERVENTIONS - Wound Cleansing / Measurement X - Simple Wound Cleansing - one wound 1 5 []  - 0 Complex Wound  Cleansing - multiple wounds X- 1 5 Wound Imaging (photographs - any number of wounds) []  - 0 Wound Tracing (instead of photographs) X- 1 5 Simple Wound Measurement - one wound []  - 0 Complex Wound Measurement - multiple wounds INTERVENTIONS - Wound Dressings X - Small Wound Dressing one or multiple wounds 1 10 []  - 0 Medium Wound Dressing one or multiple wounds []  - 0 Large Wound Dressing one or multiple wounds X- 1 5 Application of Medications - topical []  - 0 Application of Medications - injection INTERVENTIONS - Miscellaneous []  - 0 External ear exam []  - 0 Specimen Collection (cultures, biopsies, blood, body fluids, etc.) []  - 0 Specimen(s) / Culture(s) sent or taken to Lab for analysis []  - 0 Patient Transfer (multiple staff / Civil Service fast streamer / Similar devices) []  - 0 Simple Staple / Suture removal (25 or less) []  - 0 Complex Staple / Suture removal (26 or more) []  - 0 Hypo / Hyperglycemic Management (close monitor of Blood Glucose) []  - 0 Ankle / Brachial Index (ABI) - do not check if billed separately X- 1 5 Vital Signs Has the patient been seen at the hospital within the last three years: Yes Total Score: 90 Level Of Care: New/Established - Level 3 Electronic Signature(s) Signed: 01/06/2020 5:51:53 PM By: Levan Hurst RN, BSN Entered By: Levan Hurst on 01/06/2020 13:33:09 -------------------------------------------------------------------------------- Encounter Discharge Information Details Patient Name: Date of Service: Lyman Bishop MS, BENFEA RD L. 01/06/2020 10:15 A M Medical Record Number: [id number] Patient Account Number: [id number] Date of Birth/Sex: Treating RN: July 24, 1950 (69 y.o. [name] Primary Care Shaquavia Whisonant: Lin Landsman Other Clinician: Referring Kerby Borner: Treating  Kinzy Weyers/Extender: Nelida Meuse in Treatment: 52 Encounter Discharge Information Items Discharge Condition: Stable Ambulatory Status: Ambulatory Discharge Destination: Home Transportation: Private Auto Accompanied By: self Schedule Follow-up Appointment: Yes Clinical Summary of Care: Electronic Signature(s) Signed: 01/06/2020 6:05:18 PM By: Deon Pilling Entered By: Deon Pilling on 01/06/2020 12:02:31 -------------------------------------------------------------------------------- Lower Extremity Assessment Details Patient Name: Date of Service: Lyman Bishop MS, BENFEA RD L. 01/06/2020 10:15 A M Medical Record Number: [id number] Patient Account Number: [id number] Date of Birth/Sex: Treating RN: 26-Aug-1950 (69 y.o. [name] Primary Care Tomika Eckles: Lin Landsman Other Clinician: Referring Lulla Linville: Treating Jaquavion Mccannon/Extender: Nelida Meuse in Treatment: 52 Electronic Signature(s) Signed: 01/07/2020 5:21:16 PM By: Baruch Gouty RN, BSN Entered By: Baruch Gouty on 01/06/2020 10:22:28 -------------------------------------------------------------------------------- Multi Wound Chart Details Patient Name: Date of Service: Lyman Bishop MS, BENFEA RD L. 01/06/2020 10:15 A M Medical Record Number: [id number] Patient Account Number: [id number] Date of Birth/Sex: Treating RN: [birth year] (69 y.o. [name] Primary Care Ashliegh Parekh: Lin Landsman Other Clinician: Referring Isabella Roemmich: Treating Abdoulie Tierce/Extender: Nelida Meuse in Treatment: 52 Vital Signs Height(in): 71 Pulse(bpm): 82 Weight(lbs): 344 Blood Pressure(mmHg): 120/74 Body Mass Index(BMI): 48 Temperature(F): 98.3 Respiratory Rate(breaths/min): 18 Photos: [9:No Photos Distal Abdomen - midline] [N/A:N/A N/A] Wound Location: [9:Gradually Appeared] [N/A:N/A] Wounding Event: [9:Open Surgical Wound] [N/A:N/A] Primary Etiology: [9:Asthma, Chronic  Obstructive] [N/A:N/A] Comorbid History: [9:Pulmonary Disease (COPD), Sleep Apnea, Arrhythmia, Congestive Heart Failure, Coronary Artery Disease, Hypertension, Peripheral Arterial Disease, Peripheral Venous Disease 02/28/2019] [N/A:N/A] Date Acquired: [9:44] [N/A:N/A] Weeks of Treatment: [9:Open] [N/A:N/A] Wound Status: [9:3x1.6x0.1] [N/A:N/A] Measurements L x W x D (cm) [9:3.77] [N/A:N/A] A (cm) : rea [9:0.377] [N/A:N/A] Volume (cm) : [9:-300.20%] [N/A:N/A] % Reduction in Area: [9:-301.10%] [N/A:N/A] % Reduction in Volume: [9:Full Thickness Without Exposed] [N/A:N/A] Classification: [9:Support Structures Medium] [N/A:N/A] Exudate Amount: [9:Serosanguineous] [N/A:N/A] Exudate Type: [9:red, brown] [N/A:N/A] Exudate  Color: [9:Flat and Intact] [N/A:N/A] Wound Margin: [9:Large (67-100%)] [N/A:N/A] Granulation Amount: [9:Pink] [N/A:N/A] Granulation Quality: [9:None Present (0%)] [N/A:N/A] Necrotic Amount: [9:Fat Layer (Subcutaneous Tissue): Yes N/A] Exposed Structures: [9:Fascia: No Tendon: No Muscle: No Joint: No Bone: No Medium (34-66%)] [N/A:N/A] Treatment Notes Electronic Signature(s) Signed: 01/06/2020 5:39:18 PM By: Linton Ham MD Signed: 01/06/2020 5:51:53 PM By: Levan Hurst RN, BSN Entered By: Linton Ham on 01/06/2020 10:59:08 -------------------------------------------------------------------------------- Multi-Disciplinary Care Plan Details Patient Name: Date of Service: Lyman Bishop MS, [place] L. 01/06/2020 10:15 A M Medical Record Number: [id number] Patient Account Number: [id number] Date of Birth/Sex: Treating RN: 02-11-1951 (69 y.o. [name] Primary Care Aveline Daus: Lin Landsman Other Clinician: Referring [place] Baldwin: Treating Caraline Deutschman/Extender: Nelida Meuse in Treatment: 52 Active Inactive Wound/Skin Impairment Nursing Diagnoses: Impaired tissue integrity Goals: Patient/caregiver will verbalize understanding of skin care  regimen Date Initiated: 10/24/2019 Target Resolution Date: 02/07/2020 Goal Status: Active Ulcer/skin breakdown will have a volume reduction of 30% by week 4 Date Initiated: 01/04/2019 Date Inactivated: 03/28/2019 Target Resolution Date: 04/05/2019 Unmet Reason: no change see wound Goal Status: Unmet size at week 4. Interventions: Provide education on ulcer and skin care Notes: Electronic Signature(s) Signed: 01/06/2020 5:51:53 PM By: Levan Hurst RN, BSN Entered By: Levan Hurst on 01/06/2020 12:36:47 -------------------------------------------------------------------------------- Pain Assessment Details Patient Name: Date of Service: Lyman Bishop MS, [place] L. 01/06/2020 10:15 A M Medical Record Number: [id number] Patient Account Number: [id number] Date of Birth/Sex: Treating RN: 09/11/1950 (69 y.o. [name] Primary Care Renleigh Ouellet: Lin Landsman Other Clinician: Referring Chardai Gangemi: Treating Maleia Weems/Extender: Nelida Meuse in Treatment: 52 Active Problems Location of Pain Severity and Description of Pain Patient Has Paino No Site Locations Rate the pain. Current Pain Level: 0 Pain Management and Medication Current Pain Management: Electronic Signature(s) Signed: 01/07/2020 5:21:16 PM By: Baruch Gouty RN, BSN Entered By: Baruch Gouty on 01/06/2020 10:22:13 -------------------------------------------------------------------------------- Patient/Caregiver Education Details Patient Name: Date of Service: Gladys Damme, [place] L. 11/15/2021andnbsp10:15 A M Medical Record Number: [id number] Patient Account Number: [id number] Date of Birth/Gender: Treating RN: 01-01-1951 (69 y.o. [name] Primary Care Physician: Lin Landsman Other Clinician: Referring Physician: Treating Physician/Extender: Nelida Meuse in Treatment: 56 Education Assessment Education Provided To: Patient Education Topics  Provided Wound/Skin Impairment: Methods: Explain/Verbal Responses: State content correctly [employer]) Signed: 01/06/2020 5:51:53 PM By: Levan Hurst RN, BSN Entered By: Levan Hurst on 01/06/2020 12:36:59 -------------------------------------------------------------------------------- Wound Assessment Details Patient Name: Date of Service: Lyman Bishop MS, BENFEA RD L. 01/06/2020 10:15 A M Medical Record Number: [id number] Patient Account Number: [id number] Date of Birth/Sex: Treating RN: [birth year] (69 y.o. [name] Primary Care Stepen Prins: Lin Landsman Other Clinician: Referring Yussef Jorge: Treating Mariaguadalupe Fialkowski/Extender: Nelida Meuse in Treatment: 52 Wound Status Wound Number: 9 Primary Open Surgical Wound Etiology: Wound Location: Distal Abdomen - midline Wound Open Wounding Event: Gradually Appeared Status: Date Acquired: 02/28/2019 Comorbid Asthma, Chronic Obstructive Pulmonary Disease (COPD), Sleep Weeks Of Treatment: 44 History: Apnea, Arrhythmia, Congestive Heart Failure, Coronary Artery Clustered Wound: No Disease, Hypertension, Peripheral Arterial Disease, Peripheral Venous Disease Photos Wound Measurements Length: (cm) 3 Width: (cm) 1.6 Depth: (cm) 0.1 Area: (cm) 3.77 Volume: (cm) 0.377 % Reduction in Area: -300.2% % Reduction in Volume: -301.1% Epithelialization: Medium (34-66%) Tunneling: No Undermining: No Wound Description Classification: Full Thickness Without Exposed Support Structures Wound Margin: Flat and Intact Exudate Amount: Medium Exudate Type: Serosanguineous Exudate Color: red, brown Foul Odor After Cleansing: No Slough/Fibrino No Wound Bed Granulation Amount: Large (67-100%) Exposed  Structure Granulation Quality: Pink Fascia Exposed: No Necrotic Amount: None Present (0%) Fat Layer (Subcutaneous Tissue) Exposed: Yes Tendon Exposed: No Muscle Exposed: No Joint Exposed: No Bone Exposed:  No Treatment Notes Wound #9 (Distal Abdomen - midline) 1. Cleanse With Wound Cleanser 3. Primary Dressing Applied Polymem 4. Secondary Dressing ABD Pad 5. Secured With Medipore tape Notes montgomery straps. explained the new primary dressing to patient. Patient in agreement. Electronic Signature(s) Signed: 01/08/2020 3:40:21 PM By: Mikeal Hawthorne EMT/HBOT/SD Signed: 01/09/2020 2:58:18 PM By: Baruch Gouty RN, BSN Previous Signature: 01/06/2020 5:51:53 PM Version By: Levan Hurst RN, BSN Previous Signature: 01/07/2020 5:21:16 PM Version By: Baruch Gouty RN, BSN Entered By: Mikeal Hawthorne on 01/08/2020 10:16:58 -------------------------------------------------------------------------------- Vitals Details Patient Name: Date of Service: Lyman Bishop MS, [place] L. 01/06/2020 10:15 A M Medical Record Number: [id number] Patient Account Number: [id number] Date of Birth/Sex: Treating RN: Mar 06, 1950 (69 y.o. [name] Primary Care Shayana Hornstein: Lin Landsman Other Clinician: Referring Diani Jillson: Treating Bralee Feldt/Extender: Nelida Meuse in Treatment: 52 Vital Signs Time Taken: 10:20 Temperature (F): 98.3 Height (in): 71 Pulse (bpm): 82 Source: Stated Respiratory Rate (breaths/min): 18 Weight (lbs): 344 Blood Pressure (mmHg): 120/74 Source: Stated Reference Range: 80 - 120 mg / dl Body Mass Index (BMI): 48 Electronic Signature(s) Signed: 01/07/2020 5:21:16 PM By: Baruch Gouty RN, BSN Entered By: Baruch Gouty on 01/06/2020 10:21:11

## 2020-01-07 NOTE — Progress Notes (Signed)
[name], [name] ([id number]) Visit Report for 01/06/2020 HPI Details Patient Name: Date of Service: Larry Bishop MS, [place] L. 01/06/2020 10:15 A M Medical Record Number: [id number] Patient Account Number: [id number] Date of Birth/Sex: Treating RN: [birth year] (69 y.o. [name] Primary Care Provider: Lin Landsman Other Clinician: Referring Provider: Treating Provider/Extender: Nelida Meuse in Treatment: 52 History of Present Illness Location: Patient presents with a wound to left lower leg. Quality: Patient reports No Pain. Severity: Patient states wound(s) are getting worse. Duration: Patient has had the wound for < 2 weeks prior to presenting for treatment Modifying Factors: pvd HPI Description: pt smoked until 5 years ago. cancer surgery 1 year ago. has ruq stoma. cv studies last year revealed non-compressible vessels. recent pe by pcp yielded some heart abnormality and he is scheduled for stress test. 5/24 2 new wounds on rt leg. left side healed 08/12/14; several small wounds on the right lateral leg and one wound on the left leg which is new. The edema control here does not look adequate. There is also some degree of maceration around the wounds on the right lateral leg READMISSION 09/14/2018 This is a patient we had in this clinic in 2013 and then again in 2016 with wounds on his right lateral leg secondary to chronic venous insufficiency. He was cared for I believe in 2016 by Dr. Lindon Romp. The patient is here for review of 6 small wounds within a large surgical area on his abdomen. The patient had surgery for a malignant neoplasm of the sigmoid colon with a colostomy. I cannot see the date of the exact surgery in epic. In February 2018 he had an attempted colostomy reversal however he had a microperforation I believe at the anastomosis. He required a repeat laparotomy. He had an ileostomy raised at that time. I believe the surgical wound at that time  was left to close via secondary intention. The patient is unaware whether he had a wound VAC on this. In any case this almost is completely closed except he has been left with 6 small eraser head sized areas on the abdomen that have not healed. I am not exactly sure how he was how he is been dressing this. He has been followed with frequent visits in general surgery dating back at least over a year. Past medical history includes chronic systolic heart failure, stage III chronic renal failure. Malignant neoplasm of the sigmoid colon, morbid obesity. 7/31; not a lot of change 6 small wounds within a large surgical area on his abdomen. None of these are hyper granulated as opposed to last week. None of them have exposed mesh or surrounding infection. He has been using [facility] 8/14- He comes after 2 weeks, he has only 2 open wounds that I can tell this time, he has been using Hydrofera Blue 8/28; 2-week follow-up. Very superficial areas on the abdominal surgical site scar. He is using Hydrofera Blue and this seems to be doing a good job. He has not made it down to the medical supply store to see if he can get an abdominal binder or something to keep the tension off this wound area. READMISSION 01/04/2019 This is a 69 year old man we had in the clinic from July through August 2020. He had wounds in the midline abdominal scar. [Please see history from my note of 09/14/2018}. When he was here last time we used Hydrofera Blue. Things seem to be improving. He did not discharge in a healed state his  last visit here was on 8/28. He states he continued to use the [facility] and at 1 point this was very close to healing but more recently it is reopened. The wounds are basically in the same state as last time superficial friable easily bleeding wounds. Some hyper granulation. He has an abdominal binder that he got at [facility] however he says it is too tight for him to get on. Past medical history is  reviewed; he has coronary artery disease, stage III chronic renal failure heart failure with reduced ejection fraction, COPD, hypertension, obstructive sleep apnea. He was hospitalized since he was here last time for a severe pharyngitis. 11/19; the patient was in the ER on 11/16; this was for oozing from his abdominal wound. He was cauterized with quick clot gauze and epi and silver nitrate. This was felt to be venous oozing. He has very friable wounds in this area. He is using his abdominal binder. 12/3; the patient was in the ER with bleeding from the lower part of his incision in the abdomen on 11/28. Had a stitch placed to contain the bleeding area. This was just underneath his major wound. He saw general surgery yesterday and apparently we are to remove the stitch in 2 weeks. The patient is on Plavix and aspirin for coronary artery disease with stents. His bleeding was felt to be capillary. 12/17; I removed the stitch from the bleeding area that was placed last time. This came out with some difficulty. The major area almost looks like slough with an area of cellophane over it. I did not attempt any further debridement 02/28/2019. The patient has new areas superiorly and inferiorly to the major wound in the middle. The area in the middle has a nonviable surface over perhaps 60 to 70% however debridement causes uncontrolled bleeding here. He is on Plavix. We have been usin hydrogel and Prisma. 1/21; the patient has 2 superficial areas superiorly he has the major wound in the middle and a small area inferiorly in the middle of this abdominal wound. We switch to Iodoflex last time 2/4; the patient has 3 open areas. 2 of these are on the right part of this surgical wound on his abdomen and one more on the left central. The 2 on the right are hyper granulated I used silver nitrate on these. The area on the left appears to have a fibrinous surface. I attempted to debride this once he ended up in the ER.  I am reluctant to do this again. We have been using Iodoflex 2/25; he has 3 open areas small ones proximally and distally and then the larger one in the middle. This has nonviable debris over fibrinous debris over the surface. He is using Iodoflex 3/30; I follow this man monthly. He has wounds in the middle of the surgical scar in his abdomen. T oday 2 wounds which is an improvement. The major wound in the middle and one inferiorly and medially. 4/26; I follow this patient every month. This is a palliative setting. Wounds in the middle part of his surgical scar in the abdomen. He had significant amounts of denuded skin around the central area. This wipes off with Anasept and gauze. Really not any major improvement. We did get an abdominal binder for him at 1 point as usual patients do not wear these 07/16/19-Patient is back at 1 month, he has 3 small wounds in the center of his surgical scar on his abdomen, these appear to be slightly larger.  He is not using the abdominal binder as it is very uncomfortable 6/25; monthly follow-up. He is down to 2 open areas on the abdomen which appeared somewhat better today the larger area is superiorly and a small area inferiorly. There is debris on the surface of the superior 1 under illumination however very friable tissue. As opposed to what I said a month ago he is still using Iodoflex with some improvement in surface areas 7/23. Monthly follow-up. He has a central area that is still open and this abdominal scar. Not able to get on his abdominal binder that we got some time ago. He is simply growing out of it. One of her nurses suggested Montgomery straps he is going to try those. 9/2; monthly follow-up. He is using Montgomery straps. Actually his wound area looks as good as I have seen it. Still some dry flaking skin that does not appear to be robust but a lot of this is closed. Still using Iodoflex on the wound surface 10/7; we have made nice improvements in  the wound dimensions and the surface of this no longer has the adherent fibrinous debris he once had. I am reluctant to do mechanical debridement here because of the bleeding. He also has the Montgomery straps which have been very helpful I think in taking some of the torsion stress off the surface of this wound 11/15; superficial wounds x2 in the mid part of this wound. There is nothing that requires debridement under illumination this looks healthy. His abdomen is unchanged Electronic Signature(s) Signed: 01/06/2020 5:39:18 PM By: Linton Ham MD Entered By: Linton Ham on 01/06/2020 10:59:40 -------------------------------------------------------------------------------- Physical Exam Details Patient Name: Date of Service: Larry Bishop MS, [place] L. 01/06/2020 10:15 A M Medical Record Number: [id number] Patient Account Number: [id number] Date of Birth/Sex: Treating RN: January 18, 1951 (69 y.o. [name] Primary Care Provider: Lin Landsman Other Clinician: Referring Provider: Treating Provider/Extender: Nelida Meuse in Treatment: 52 Constitutional Sitting or standing Blood Pressure is within target range for patient.. Pulse regular and within target range for patient.[name] Kitchen Respirations regular, non-labored and within target range.. Temperature is normal and within the target range for the patient.[name] Kitchen Appears in no distress. Notes Wound exam; midline abdominal scar mid part of this wound x2 openings. These are very superficial and do not look unhealthy. There is no debridement required. They are in the mid part of the distended abdominal wall. He is using his [employer]) Signed: 01/06/2020 5:39:18 PM By: Linton Ham MD Entered By: Linton Ham on 01/06/2020 11:00:29 -------------------------------------------------------------------------------- Physician Orders Details Patient Name: Date of Service: Larry Bishop MS, [place] L.  01/06/2020 10:15 A M Medical Record Number: [id number] Patient Account Number: [id number] Date of Birth/Sex: Treating RN: [birth year] (69 y.o. [name] Primary Care Provider: Lin Landsman Other Clinician: Referring Provider: Treating Provider/Extender: Nelida Meuse in Treatment: 73 Verbal / Phone Orders: No Diagnosis Coding ICD-10 Coding Code Description T81.31XD Disruption of external operation (surgical) wound, not elsewhere classified, subsequent encounter S31.105S Unspecified open wound of abdominal wall, periumbilic region without penetration into peritoneal cavity, sequela Follow-up Appointments Return appointment in 1 month. Dressing Change Frequency Wound #9 Distal Abdomen - midline Change Dressing every other day. Wound Cleansing May shower and wash wound with soap and water. - with dressing change Primary Wound Dressing Wound #9 Distal Abdomen - midline Polymem Secondary Dressing Wound #9 Distal Abdomen - midline Dry Gauze ABD pad Other: - montgomery binder while up during the day.  may remove at night. Electronic Signature(s) Signed: 01/06/2020 5:39:18 PM By: Linton Ham MD Signed: 01/06/2020 5:51:53 PM By: Levan Hurst RN, BSN Entered By: Levan Hurst on 01/06/2020 10:45:49 -------------------------------------------------------------------------------- Problem List Details Patient Name: Date of Service: Larry Bishop MS, [place] L. 01/06/2020 10:15 A M Medical Record Number: [id number] Patient Account Number: [id number] Date of Birth/Sex: Treating RN: 1950-05-14 (69 y.o. [name] Primary Care Provider: Lin Landsman Other Clinician: Referring Provider: Treating Provider/Extender: Nelida Meuse in Treatment: 52 Active Problems ICD-10 Encounter Code Description Active Date MDM Diagnosis T81.31XD Disruption of external operation (surgical) wound, not elsewhere classified, 01/04/2019 No  Yes subsequent encounter S31.105S Unspecified open wound of abdominal wall, periumbilic region without 80/99/8338 No Yes penetration into peritoneal cavity, sequela Inactive Problems Resolved Problems Electronic Signature(s) Signed: 01/06/2020 5:39:18 PM By: Linton Ham MD Entered By: Linton Ham on 01/06/2020 10:58:10 -------------------------------------------------------------------------------- Progress Note Details Patient Name: Date of Service: Larry Bishop MS, [place] L. 01/06/2020 10:15 A M Medical Record Number: [id number] Patient Account Number: [id number] Date of Birth/Sex: Treating RN: 02-11-1951 (69 y.o. [name] Primary Care Provider: Lin Landsman Other Clinician: Referring Provider: Treating Provider/Extender: Nelida Meuse in Treatment: 52 Subjective History of Present Illness (HPI) The following HPI elements were documented for the patient's wound: Location: Patient presents with a wound to left lower leg. Quality: Patient reports No Pain. Severity: Patient states wound(s) are getting worse. Duration: Patient has had the wound for < 2 weeks prior to presenting for treatment Modifying Factors: pvd pt smoked until 5 years ago. cancer surgery 1 year ago. has ruq stoma. cv studies last year revealed non-compressible vessels. recent pe by pcp yielded some heart abnormality and he is scheduled for stress test. 5/24 2 new wounds on rt leg. left side healed 08/12/14; several small wounds on the right lateral leg and one wound on the left leg which is new. The edema control here does not look adequate. There is also some degree of maceration around the wounds on the right lateral leg READMISSION 09/14/2018 This is a patient we had in this clinic in 2013 and then again in 2016 with wounds on his right lateral leg secondary to chronic venous insufficiency. He was cared for I believe in 2016 by Dr. Lindon Romp. The patient is here for review of 6  small wounds within a large surgical area on his abdomen. The patient had surgery for a malignant neoplasm of the sigmoid colon with a colostomy. I cannot see the date of the exact surgery in epic. In February 2018 he had an attempted colostomy reversal however he had a microperforation I believe at the anastomosis. He required a repeat laparotomy. He had an ileostomy raised at that time. I believe the surgical wound at that time was left to close via secondary intention. The patient is unaware whether he had a wound VAC on this. In any case this almost is completely closed except he has been left with 6 small eraser head sized areas on the abdomen that have not healed. I am not exactly sure how he was how he is been dressing this. He has been followed with frequent visits in general surgery dating back at least over a year. Past medical history includes chronic systolic heart failure, stage III chronic renal failure. Malignant neoplasm of the sigmoid colon, morbid obesity. 7/31; not a lot of change 6 small wounds within a large surgical area on his abdomen. None of these are hyper granulated as opposed  to last week. None of them have exposed mesh or surrounding infection. He has been using [facility] 8/14- He comes after 2 weeks, he has only 2 open wounds that I can tell this time, he has been using Hydrofera Blue 8/28; 2-week follow-up. Very superficial areas on the abdominal surgical site scar. He is using Hydrofera Blue and this seems to be doing a good job. He has not made it down to the medical supply store to see if he can get an abdominal binder or something to keep the tension off this wound area. READMISSION 01/04/2019 This is a 69 year old man we had in the clinic from July through August 2020. He had wounds in the midline abdominal scar. [Please see history from my note of 09/14/2018}. When he was here last time we used Hydrofera Blue. Things seem to be improving. He did not discharge  in a healed state his last visit here was on 8/28. He states he continued to use the [facility] and at 1 point this was very close to healing but more recently it is reopened. The wounds are basically in the same state as last time superficial friable easily bleeding wounds. Some hyper granulation. He has an abdominal binder that he got at [facility] however he says it is too tight for him to get on. Past medical history is reviewed; he has coronary artery disease, stage III chronic renal failure heart failure with reduced ejection fraction, COPD, hypertension, obstructive sleep apnea. He was hospitalized since he was here last time for a severe pharyngitis. 11/19; the patient was in the ER on 11/16; this was for oozing from his abdominal wound. He was cauterized with quick clot gauze and epi and silver nitrate. This was felt to be venous oozing. He has very friable wounds in this area. He is using his abdominal binder. 12/3; the patient was in the ER with bleeding from the lower part of his incision in the abdomen on 11/28. Had a stitch placed to contain the bleeding area. This was just underneath his major wound. He saw general surgery yesterday and apparently we are to remove the stitch in 2 weeks. The patient is on Plavix and aspirin for coronary artery disease with stents. His bleeding was felt to be capillary. 12/17; I removed the stitch from the bleeding area that was placed last time. This came out with some difficulty. The major area almost looks like slough with an area of cellophane over it. I did not attempt any further debridement 02/28/2019. The patient has new areas superiorly and inferiorly to the major wound in the middle. The area in the middle has a nonviable surface over perhaps 60 to 70% however debridement causes uncontrolled bleeding here. He is on Plavix. We have been usin hydrogel and Prisma. 1/21; the patient has 2 superficial areas superiorly he has the major wound in  the middle and a small area inferiorly in the middle of this abdominal wound. We switch to Iodoflex last time 2/4; the patient has 3 open areas. 2 of these are on the right part of this surgical wound on his abdomen and one more on the left central. The 2 on the right are hyper granulated I used silver nitrate on these. The area on the left appears to have a fibrinous surface. I attempted to debride this once he ended up in the ER. I am reluctant to do this again. We have been using Iodoflex 2/25; he has 3 open areas small ones  proximally and distally and then the larger one in the middle. This has nonviable debris over fibrinous debris over the surface. He is using Iodoflex 3/30; I follow this man monthly. He has wounds in the middle of the surgical scar in his abdomen. T oday 2 wounds which is an improvement. The major wound in the middle and one inferiorly and medially. 4/26; I follow this patient every month. This is a palliative setting. Wounds in the middle part of his surgical scar in the abdomen. He had significant amounts of denuded skin around the central area. This wipes off with Anasept and gauze. Really not any major improvement. We did get an abdominal binder for him at 1 point as usual patients do not wear these 07/16/19-Patient is back at 1 month, he has 3 small wounds in the center of his surgical scar on his abdomen, these appear to be slightly larger. He is not using the abdominal binder as it is very uncomfortable 6/25; monthly follow-up. He is down to 2 open areas on the abdomen which appeared somewhat better today the larger area is superiorly and a small area inferiorly. There is debris on the surface of the superior 1 under illumination however very friable tissue. As opposed to what I said a month ago he is still using Iodoflex with some improvement in surface areas 7/23. Monthly follow-up. He has a central area that is still open and this abdominal scar. Not able to get on  his abdominal binder that we got some time ago. He is simply growing out of it. One of her nurses suggested Montgomery straps he is going to try those. 9/2; monthly follow-up. He is using Montgomery straps. Actually his wound area looks as good as I have seen it. Still some dry flaking skin that does not appear to be robust but a lot of this is closed. Still using Iodoflex on the wound surface 10/7; we have made nice improvements in the wound dimensions and the surface of this no longer has the adherent fibrinous debris he once had. I am reluctant to do mechanical debridement here because of the bleeding. He also has the Montgomery straps which have been very helpful I think in taking some of the torsion stress off the surface of this wound 11/15; superficial wounds x2 in the mid part of this wound. There is nothing that requires debridement under illumination this looks healthy. His abdomen is unchanged Objective Constitutional Sitting or standing Blood Pressure is within target range for patient.. Pulse regular and within target range for patient.[name] Kitchen Respirations regular, non-labored and within target range.. Temperature is normal and within the target range for the patient.[name] Kitchen Appears in no distress. Vitals Time Taken: 10:20 AM, Height: 71 in, Source: Stated, Weight: 344 lbs, Source: Stated, BMI: 48, Temperature: 98.3 F, Pulse: 82 bpm, Respiratory Rate: 18 breaths/min, Blood Pressure: 120/74 mmHg. General Notes: Wound exam; midline abdominal scar mid part of this wound x2 openings. These are very superficial and do not look unhealthy. There is no debridement required. They are in the mid part of the distended abdominal wall. He is using his Montgomery straps Integumentary (Hair, Skin) Wound #9 status is Open. Original cause of wound was Gradually Appeared. The wound is located on the Distal Abdomen - midline. The wound measures 3cm length x 1.6cm width x 0.1cm depth; 3.77cm^2 area and 0.377cm^3  volume. There is Fat Layer (Subcutaneous Tissue) exposed. There is no tunneling or undermining noted. There is a medium amount of serosanguineous drainage  noted. The wound margin is flat and intact. There is large (67-100%) pink granulation within the wound bed. There is no necrotic tissue within the wound bed. Assessment Active Problems ICD-10 Disruption of external operation (surgical) wound, not elsewhere classified, subsequent encounter Unspecified open wound of abdominal wall, periumbilic region without penetration into peritoneal cavity, sequela Plan Follow-up Appointments: Return appointment in 1 month. Dressing Change Frequency: Wound #9 Distal Abdomen - midline: Change Dressing every other day. Wound Cleansing: May shower and wash wound with soap and water. - with dressing change Primary Wound Dressing: Wound #9 Distal Abdomen - midline: Polymem Secondary Dressing: Wound #9 Distal Abdomen - midline: Dry Gauze ABD pad Other: - montgomery binder while up during the day. may remove at night. 1. I change the primary dressing to polymen Hydrofera Blue as the major alternative 2. I do not think Iodoflex is doing anything helpful here at 1 point we were debriding the wounds but they no longer look like they require it Electronic Signature(s) Signed: 01/06/2020 5:39:18 PM By: Linton Ham MD Entered By: Linton Ham on 01/06/2020 11:01:09 -------------------------------------------------------------------------------- SuperBill Details Patient Name: Date of Service: Larry Bishop MS, Wallace L. 01/06/2020 Medical Record Number: [id number] Patient Account Number: [id number] Date of Birth/Sex: Treating RN: 07/02/1950 (68 y.o. [name] Primary Care Provider: Lin Landsman Other Clinician: Referring Provider: Treating Provider/Extender: Nelida Meuse in Treatment: 52 Diagnosis Coding ICD-10 Codes Code Description T81.31XD Disruption of external  operation (surgical) wound, not elsewhere classified, subsequent encounter S31.105S Unspecified open wound of abdominal wall, periumbilic region without penetration into peritoneal cavity, sequela Facility Procedures The patient participates with Medicare or their insurance follows the Medicare Facility Guidelines: CPT4 Code Description Modifier Quantity [id number] [place] VISIT-LEV 3 EST PT 1 Physician Procedures : CPT4 Code Description Modifier [id number] - WC PHYS LEVEL 3 - EST PT ICD-10 Diagnosis Description T81.31XD Disruption of external operation (surgical) wound, not elsewhere classified, subsequent encounter S31.105S Unspecified open wound of abdominal  wall, periumbilic region without penetration into peritoneal cavi Quantity: 1 ty, sequela Electronic Signature(s) Signed: 01/06/2020 5:39:18 PM By: Linton Ham MD Signed: 01/06/2020 5:51:53 PM By: Levan Hurst RN, BSN Entered By: Levan Hurst on 01/06/2020 13:33:20

## 2020-01-22 DIAGNOSIS — D631 Anemia in chronic kidney disease: Secondary | ICD-10-CM | POA: Diagnosis not present

## 2020-01-22 DIAGNOSIS — N1831 Chronic kidney disease, stage 3a: Secondary | ICD-10-CM | POA: Diagnosis not present

## 2020-01-22 DIAGNOSIS — N183 Chronic kidney disease, stage 3 unspecified: Secondary | ICD-10-CM | POA: Diagnosis not present

## 2020-01-22 DIAGNOSIS — R609 Edema, unspecified: Secondary | ICD-10-CM | POA: Diagnosis not present

## 2020-01-22 DIAGNOSIS — R809 Proteinuria, unspecified: Secondary | ICD-10-CM | POA: Diagnosis not present

## 2020-01-22 DIAGNOSIS — I129 Hypertensive chronic kidney disease with stage 1 through stage 4 chronic kidney disease, or unspecified chronic kidney disease: Secondary | ICD-10-CM | POA: Diagnosis not present

## 2020-01-23 DIAGNOSIS — N183 Chronic kidney disease, stage 3 unspecified: Secondary | ICD-10-CM | POA: Diagnosis not present

## 2020-01-24 ENCOUNTER — Other Ambulatory Visit: Payer: Self-pay | Admitting: Nephrology

## 2020-01-24 DIAGNOSIS — R809 Proteinuria, unspecified: Secondary | ICD-10-CM

## 2020-01-24 DIAGNOSIS — N183 Chronic kidney disease, stage 3 unspecified: Secondary | ICD-10-CM

## 2020-02-03 ENCOUNTER — Encounter (HOSPITAL_BASED_OUTPATIENT_CLINIC_OR_DEPARTMENT_OTHER): Payer: Medicare Other | Attending: Internal Medicine | Admitting: Internal Medicine

## 2020-02-03 ENCOUNTER — Other Ambulatory Visit: Payer: Self-pay

## 2020-02-03 DIAGNOSIS — Z87891 Personal history of nicotine dependence: Secondary | ICD-10-CM | POA: Insufficient documentation

## 2020-02-03 DIAGNOSIS — Z7982 Long term (current) use of aspirin: Secondary | ICD-10-CM | POA: Diagnosis not present

## 2020-02-03 DIAGNOSIS — I739 Peripheral vascular disease, unspecified: Secondary | ICD-10-CM | POA: Diagnosis not present

## 2020-02-03 DIAGNOSIS — Z7902 Long term (current) use of antithrombotics/antiplatelets: Secondary | ICD-10-CM | POA: Insufficient documentation

## 2020-02-03 DIAGNOSIS — Z6841 Body Mass Index (BMI) 40.0 and over, adult: Secondary | ICD-10-CM | POA: Diagnosis not present

## 2020-02-03 DIAGNOSIS — Z85038 Personal history of other malignant neoplasm of large intestine: Secondary | ICD-10-CM | POA: Diagnosis not present

## 2020-02-03 DIAGNOSIS — I872 Venous insufficiency (chronic) (peripheral): Secondary | ICD-10-CM | POA: Diagnosis not present

## 2020-02-03 DIAGNOSIS — Z86711 Personal history of pulmonary embolism: Secondary | ICD-10-CM | POA: Diagnosis not present

## 2020-02-03 DIAGNOSIS — Y838 Other surgical procedures as the cause of abnormal reaction of the patient, or of later complication, without mention of misadventure at the time of the procedure: Secondary | ICD-10-CM | POA: Insufficient documentation

## 2020-02-03 DIAGNOSIS — I2510 Atherosclerotic heart disease of native coronary artery without angina pectoris: Secondary | ICD-10-CM | POA: Diagnosis not present

## 2020-02-03 DIAGNOSIS — Z955 Presence of coronary angioplasty implant and graft: Secondary | ICD-10-CM | POA: Insufficient documentation

## 2020-02-03 DIAGNOSIS — T8131XA Disruption of external operation (surgical) wound, not elsewhere classified, initial encounter: Secondary | ICD-10-CM | POA: Insufficient documentation

## 2020-02-06 NOTE — Progress Notes (Signed)
Nordquist, Cordaro L. ([id number]) Visit Report for 02/03/2020 Arrival Information Details Patient Name: Date of Service: [place] MS, [place] L. 02/03/2020 10:15 A M Medical Record Number: [id number] Patient Account Number: [id number] Date of Birth/Sex: Treating RN: 21-Mar-1950 (68 y.o. Jerilynn Mages) Carlene Coria Primary Care Merrell Borsuk: Lin Landsman Other Clinician: Referring Jairy Angulo: Treating Kydan Shanholtzer/Extender: Nelida Meuse in Treatment: 53 Visit Information History Since Last Visit All ordered tests and consults were completed: No Patient Arrived: Ambulatory Added or deleted any medications: No Arrival Time: 10:21 Any new allergies or adverse reactions: No Accompanied By: self Had a fall or experienced change in No Transfer Assistance: None activities of daily living that may affect Patient Requires Transmission-Based Precautions: No risk of falls: Patient Has Alerts: No Signs or symptoms of abuse/neglect since last visito No Hospitalized since last visit: No Implantable device outside of the clinic excluding No cellular tissue based products placed in the center since last visit: Has Dressing in Place as Prescribed: Yes Pain Present Now: No Electronic Signature(s) Signed: 02/04/2020 4:37:04 PM By: Carlene Coria RN Entered By: Carlene Coria on 02/03/2020 10:21:25 -------------------------------------------------------------------------------- Clinic Level of Care Assessment Details Patient Name: Date of Service: Orpah Greek RD L. 02/03/2020 10:15 A M Medical Record Number: [id number] Patient Account Number: [id number] Date of Birth/Sex: Treating RN: February 07, 1951 (69 y.o. [name] Primary Care Audree Schrecengost: Lin Landsman Other Clinician: Referring Addelyn Alleman: Treating Mieka Leaton/Extender: Nelida Meuse in Treatment: 56 Clinic Level of Care Assessment Items TOOL 4 Quantity Score X- 1 0 Use when only an EandM is performed on FOLLOW-UP  visit ASSESSMENTS - Nursing Assessment / Reassessment X- 1 10 Reassessment of Co-morbidities (includes updates in patient status) X- 1 5 Reassessment of Adherence to Treatment Plan ASSESSMENTS - Wound and Skin A ssessment / Reassessment X - Simple Wound Assessment / Reassessment - one wound 1 5 []  - 0 Complex Wound Assessment / Reassessment - multiple wounds []  - 0 Dermatologic / Skin Assessment (not related to wound area) ASSESSMENTS - Focused Assessment []  - 0 Circumferential Edema Measurements - multi extremities []  - 0 Nutritional Assessment / Counseling / Intervention []  - 0 Lower Extremity Assessment (monofilament, tuning fork, pulses) []  - 0 Peripheral Arterial Disease Assessment (using hand held doppler) ASSESSMENTS - Ostomy and/or Continence Assessment and Care []  - 0 Incontinence Assessment and Management []  - 0 Ostomy Care Assessment and Management (repouching, etc.) PROCESS - Coordination of Care X - Simple Patient / Family Education for ongoing care 1 15 []  - 0 Complex (extensive) Patient / Family Education for ongoing care X- 1 10 Staff obtains Programmer, systems, Records, T Results / Process Orders est []  - 0 Staff telephones HHA, Nursing Homes / Clarify orders / etc []  - 0 Routine Transfer to another Facility (non-emergent condition) []  - 0 Routine Hospital Admission (non-emergent condition) []  - 0 New Admissions / Biomedical engineer / Ordering NPWT Apligraf, etc. , []  - 0 Emergency Hospital Admission (emergent condition) X- 1 10 Simple Discharge Coordination []  - 0 Complex (extensive) Discharge Coordination PROCESS - Special Needs []  - 0 Pediatric / Minor Patient Management []  - 0 Isolation Patient Management []  - 0 Hearing / Language / Visual special needs []  - 0 Assessment of Community assistance (transportation, D/C planning, etc.) []  - 0 Additional assistance / Altered mentation []  - 0 Support Surface(s) Assessment (bed, cushion, seat,  etc.) INTERVENTIONS - Wound Cleansing / Measurement X - Simple Wound Cleansing - one wound 1 5 []  - 0 Complex Wound Cleansing -  multiple wounds X- 1 5 Wound Imaging (photographs - any number of wounds) []  - 0 Wound Tracing (instead of photographs) X- 1 5 Simple Wound Measurement - one wound []  - 0 Complex Wound Measurement - multiple wounds INTERVENTIONS - Wound Dressings X - Small Wound Dressing one or multiple wounds 1 10 []  - 0 Medium Wound Dressing one or multiple wounds []  - 0 Large Wound Dressing one or multiple wounds X- 1 5 Application of Medications - topical []  - 0 Application of Medications - injection INTERVENTIONS - Miscellaneous []  - 0 External ear exam []  - 0 Specimen Collection (cultures, biopsies, blood, body fluids, etc.) []  - 0 Specimen(s) / Culture(s) sent or taken to Lab for analysis []  - 0 Patient Transfer (multiple staff / Harrel Lemon Lift / Similar devices) []  - 0 Simple Staple / Suture removal (25 or less) []  - 0 Complex Staple / Suture removal (26 or more) []  - 0 Hypo / Hyperglycemic Management (close monitor of Blood Glucose) []  - 0 Ankle / Brachial Index (ABI) - do not check if billed separately X- 1 5 Vital Signs Has the patient been seen at the hospital within the last three years: Yes Total Score: 90 Level Of Care: New/Established - Level 3 Electronic Signature(s) Signed: 02/04/2020 5:24:10 PM By: Levan Hurst RN, BSN Entered By: Levan Hurst on 02/04/2020 07:31:39 -------------------------------------------------------------------------------- Encounter Discharge Information Details Patient Name: Date of Service: Lyman Bishop MS, BENFEA RD L. 02/03/2020 10:15 A M Medical Record Number: [id number] Patient Account Number: [id number] Date of Birth/Sex: Treating RN: 03-16-1950 (69 y.o. [name] Primary Care Nareh Matzke: Lin Landsman Other Clinician: Referring Elika Godar: Treating Joslyne Marshburn/Extender: Nelida Meuse in  Treatment: 56 Encounter Discharge Information Items Discharge Condition: Stable Ambulatory Status: Ambulatory Discharge Destination: Home Transportation: Private Auto Accompanied By: self Schedule Follow-up Appointment: Yes Clinical Summary of Care: Electronic Signature(s) Signed: 02/04/2020 4:43:44 PM By: Deon Pilling Entered By: Deon Pilling on 02/03/2020 11:13:59 -------------------------------------------------------------------------------- Multi Wound Chart Details Patient Name: Date of Service: Lyman Bishop MS, BENFEA RD L. 02/03/2020 10:15 A M Medical Record Number: [id number] Patient Account Number: [id number] Date of Birth/Sex: Treating RN: 05-Nov-1950 (69 y.o. [name] Primary Care Hayze Gazda: Lin Landsman Other Clinician: Referring Allexus Ovens: Treating Ronnica Dreese/Extender: Nelida Meuse in Treatment: 56 Vital Signs Height(in): 71 Pulse(bpm): 99 Weight(lbs): 344 Blood Pressure(mmHg): 130/79 Body Mass Index(BMI): 48 Temperature(F): 98.3 Respiratory Rate(breaths/min): 18 Photos: [9:No Photos Distal Abdomen - midline] [N/A:N/A N/A] Wound Location: [9:Gradually Appeared] [N/A:N/A] Wounding Event: [9:Open Surgical Wound] [N/A:N/A] Primary Etiology: [9:Asthma, Chronic Obstructive] [N/A:N/A] Comorbid History: [9:Pulmonary Disease (COPD), Sleep Apnea, Arrhythmia, Congestive Heart Failure, Coronary Artery Disease, Hypertension, Peripheral Arterial Disease, Peripheral Venous Disease 02/28/2019] [N/A:N/A] Date Acquired: [9:48] [N/A:N/A] Weeks of Treatment: [9:Open] [N/A:N/A] Wound Status: [9:3x1x0.1] [N/A:N/A] Measurements L x W x D (cm) [9:2.356] [N/A:N/A] A (cm) : rea [9:0.236] [N/A:N/A] Volume (cm) : [9:-150.10%] [N/A:N/A] % Reduction in Area: [9:-151.10%] [N/A:N/A] % Reduction in Volume: [9:Full Thickness Without Exposed] [N/A:N/A] Classification: [9:Support Structures Medium] [N/A:N/A] Exudate Amount: [9:Serosanguineous] [N/A:N/A] Exudate Type:  [9:red, brown] [N/A:N/A] Exudate Color: [9:Flat and Intact] [N/A:N/A] Wound Margin: [9:Large (67-100%)] [N/A:N/A] Granulation Amount: [9:Pink] [N/A:N/A] Granulation Quality: [9:None Present (0%)] [N/A:N/A] Necrotic Amount: [9:Fat Layer (Subcutaneous Tissue): Yes N/A] Exposed Structures: [9:Fascia: No Tendon: No Muscle: No Joint: No Bone: No Medium (34-66%)] [N/A:N/A] Treatment Notes Wound #9 (Abdomen - midline) Wound Laterality: Distal Cleanser Soap and Water Discharge Instruction: May shower and wash wound with dial antibacterial soap and water prior to dressing change. Peri-Wound Care Topical  Primary Dressing PolyMem Non-Adhesive Dressing, 4x4 in Discharge Instruction: Apply to wound bed as instructed Secondary Dressing Woven Gauze Sponge, Non-Sterile 4x4 in Discharge Instruction: Apply over primary dressing as directed. Secured With 72M Medipore H Soft Cloth Surgical T 4 x 2 (in/yd) ape Discharge Instruction: Secure dressing with tape as directed. Compression Wrap Compression Stockings Add-Ons Electronic Signature(s) Signed: 02/04/2020 5:24:10 PM By: Levan Hurst RN, BSN Signed: 02/06/2020 8:09:40 AM By: Linton Ham MD Entered By: Linton Ham on 02/03/2020 11:13:03 -------------------------------------------------------------------------------- Multi-Disciplinary Care Plan Details Patient Name: Date of Service: Lyman Bishop MS, BENFEA RD L. 02/03/2020 10:15 A M Medical Record Number: [id number] Patient Account Number: [id number] Date of Birth/Sex: Treating RN: 11-10-1950 (69 y.o. [name] Primary Care Shaletta Hinostroza: Lin Landsman Other Clinician: Referring Ardit Danh: Treating Nicolas Banh/Extender: Nelida Meuse in Treatment: 56 Active Inactive Wound/Skin Impairment Nursing Diagnoses: Impaired tissue integrity Goals: Patient/caregiver will verbalize understanding of skin care regimen Date Initiated: 10/24/2019 Target Resolution Date:  03/06/2020 Goal Status: Active Ulcer/skin breakdown will have a volume reduction of 30% by week 4 Date Initiated: 01/04/2019 Date Inactivated: 03/28/2019 Target Resolution Date: 04/05/2019 Unmet Reason: no change see wound Goal Status: Unmet size at week 4. Interventions: Provide education on ulcer and skin care Notes: Electronic Signature(s) Signed: 02/04/2020 5:24:10 PM By: Levan Hurst RN, BSN Entered By: Levan Hurst on 02/04/2020 07:31:11 -------------------------------------------------------------------------------- Pain Assessment Details Patient Name: Date of Service: Lyman Bishop MS, [place] L. 02/03/2020 10:15 A M Medical Record Number: [id number] Patient Account Number: [id number] Date of Birth/Sex: Treating RN: Jun 22, 1950 (68 y.o. Oval Linsey Primary Care Ryan Ogborn: Lin Landsman Other Clinician: Referring Shariff Lasky: Treating Hayze Gazda/Extender: Nelida Meuse in Treatment: 56 Active Problems Location of Pain Severity and Description of Pain Patient Has Paino No Site Locations Pain Management and Medication Current Pain Management: Electronic Signature(s) Signed: 02/04/2020 4:37:04 PM By: Carlene Coria RN Entered By: Carlene Coria on 02/03/2020 10:21:55 -------------------------------------------------------------------------------- Patient/Caregiver Education Details Patient Name: Date of Service: Gladys Damme, [place] L. 12/13/2021andnbsp10:15 [place] Record Number: [id number] Patient Account Number: [id number] Date of Birth/Gender: Treating RN: 1950-07-12 (69 y.o. [name] Primary Care Physician: Lin Landsman Other Clinician: Referring Physician: Treating Physician/Extender: Nelida Meuse in Treatment: 85 Education Assessment Education Provided To: Patient Education Topics Provided Wound/Skin Impairment: Methods: Explain/Verbal Responses: State content correctly [employer]) Signed:  02/04/2020 5:24:10 PM By: Levan Hurst RN, BSN Entered By: Levan Hurst on 02/04/2020 07:31:20 -------------------------------------------------------------------------------- Wound Assessment Details Patient Name: Date of Service: Lyman Bishop MS, [place] RD L. 02/03/2020 10:15 A M Medical Record Number: [id number] Patient Account Number: [id number] Date of Birth/Sex: Treating RN: 04-23-1950 (68 y.o. Jerilynn Mages) Carlene Coria Primary Care Keylah Darwish: Lin Landsman Other Clinician: Referring Ulanda Tackett: Treating Vendetta Pittinger/Extender: Nelida Meuse in Treatment: 56 Wound Status Wound Number: 9 Primary Open Surgical Wound Etiology: Wound Location: Distal Abdomen - midline Wound Open Wounding Event: Gradually Appeared Status: Date Acquired: 02/28/2019 Comorbid Asthma, Chronic Obstructive Pulmonary Disease (COPD), Sleep Weeks Of Treatment: 48 History: Apnea, Arrhythmia, Congestive Heart Failure, Coronary Artery Clustered Wound: No Disease, Hypertension, Peripheral Arterial Disease, Peripheral Venous Disease Wound Measurements Length: (cm) 3 Width: (cm) 1 Depth: (cm) 0.1 Area: (cm) 2.356 Volume: (cm) 0.236 % Reduction in Area: -150.1% % Reduction in Volume: -151.1% Epithelialization: Medium (34-66%) Tunneling: No Undermining: No Wound Description Classification: Full Thickness Without Exposed Support Structures Wound Margin: Flat and Intact Exudate Amount: Medium Exudate Type: Serosanguineous Exudate Color: red, brown Foul Odor After Cleansing: No Slough/Fibrino No Wound Bed Granulation  Amount: Large (67-100%) Exposed Structure Granulation Quality: Pink Fascia Exposed: No Necrotic Amount: None Present (0%) Fat Layer (Subcutaneous Tissue) Exposed: Yes Tendon Exposed: No Muscle Exposed: No Joint Exposed: No Bone Exposed: No Treatment Notes Wound #9 (Abdomen - midline) Wound Laterality: Distal Cleanser Soap and Water Discharge Instruction: May shower and wash  wound with dial antibacterial soap and water prior to dressing change. Peri-Wound Care Topical Primary Dressing PolyMem Non-Adhesive Dressing, 4x4 in Discharge Instruction: Apply to wound bed as instructed Secondary Dressing Woven Gauze Sponge, Non-Sterile 4x4 in Discharge Instruction: Apply over primary dressing as directed. Secured With 52M Medipore H Soft Cloth Surgical T 4 x 2 (in/yd) ape Discharge Instruction: Secure dressing with tape as directed. Compression Wrap Compression Stockings Add-Ons Electronic Signature(s) Signed: 02/04/2020 4:37:04 PM By: Carlene Coria RN Entered By: Carlene Coria on 02/03/2020 10:22:52 -------------------------------------------------------------------------------- Vitals Details Patient Name: Date of Service: Lyman Bishop MS, [place] L. 02/03/2020 10:15 A M Medical Record Number: [id number] Patient Account Number: [id number] Date of Birth/Sex: Treating RN: Jul 17, 1950 (68 y.o. Jerilynn Mages) Carlene Coria Primary Care Theadora Noyes: Lin Landsman Other Clinician: Referring Emmersen Garraway: Treating Taziyah Iannuzzi/Extender: Nelida Meuse in Treatment: 56 Vital Signs Time Taken: 10:21 Temperature (F): 98.3 Height (in): 71 Pulse (bpm): 99 Weight (lbs): 344 Respiratory Rate (breaths/min): 18 Body Mass Index (BMI): 48 Blood Pressure (mmHg): 130/79 Reference Range: 80 - 120 mg / dl Electronic Signature(s) Signed: 02/04/2020 4:37:04 PM By: Carlene Coria RN Entered By: Carlene Coria on 02/03/2020 10:21:47

## 2020-02-06 NOTE — Progress Notes (Signed)
Larry [name], Mikyle L. ([id number]) Visit Report for 02/03/2020 HPI Details Patient Name: Date of Service: [facility] MS, [facility] RD L. 02/03/2020 10:15 A M Medical Record Number: [id number] Patient Account Number: [id number] Date of Birth/Sex: Treating RN: 07/06/1950 (69 y.o. [name] Primary Care Provider: Lin Landsman Other Clinician: Referring Provider: Treating Provider/Extender: Nelida Meuse in Treatment: 56 History of Present Illness Location: Patient presents with a wound to left lower leg. Quality: Patient reports No Pain. Severity: Patient states wound(s) are getting worse. Duration: Patient has had the wound for < 2 weeks prior to presenting for treatment Modifying Factors: pvd HPI Description: pt smoked until 5 years ago. cancer surgery 1 year ago. has ruq stoma. cv studies last year revealed non-compressible vessels. recent pe by pcp yielded some heart abnormality and he is scheduled for stress test. 5/24 2 new wounds on rt leg. left side healed 08/12/14; several small wounds on the right lateral leg and one wound on the left leg which is new. The edema control here does not look adequate. There is also some degree of maceration around the wounds on the right lateral leg READMISSION 09/14/2018 This is a patient we had in this clinic in 2013 and then again in 2016 with wounds on his right lateral leg secondary to chronic venous insufficiency. He was cared for I believe in 2016 by Dr. Lindon Romp. The patient is here for review of 6 small wounds within a large surgical area on his abdomen. The patient had surgery for a malignant neoplasm of the sigmoid colon with a colostomy. I cannot see the date of the exact surgery in epic. In February 2018 he had an attempted colostomy reversal however he had a microperforation I believe at the anastomosis. He required a repeat laparotomy. He had an ileostomy raised at that time. I believe the surgical wound at that time  was left to close via secondary intention. The patient is unaware whether he had a wound VAC on this. In any case this almost is completely closed except he has been left with 6 small eraser head sized areas on the abdomen that have not healed. I am not exactly sure how he was how he is been dressing this. He has been followed with frequent visits in general surgery dating back at least over a year. Past medical history includes chronic systolic heart failure, stage III chronic renal failure. Malignant neoplasm of the sigmoid colon, morbid obesity. 7/31; not a lot of change 6 small wounds within a large surgical area on his abdomen. None of these are hyper granulated as opposed to last week. None of them have exposed mesh or surrounding infection. He has been using [facility] 8/14- He comes after 2 weeks, he has only 2 open wounds that I can tell this time, he has been using Hydrofera Blue 8/28; 2-week follow-up. Very superficial areas on the abdominal surgical site scar. He is using Hydrofera Blue and this seems to be doing a good job. He has not made it down to the medical supply store to see if he can get an abdominal binder or something to keep the tension off this wound area. READMISSION 01/04/2019 This is a 69 year old man we had in the clinic from July through August 2020. He had wounds in the midline abdominal scar. [Please see history from my note of 09/14/2018}. When he was here last time we used Hydrofera Blue. Things seem to be improving. He did not discharge in a healed state his  last visit here was on 8/28. He states he continued to use the [facility] and at 1 point this was very close to healing but more recently it is reopened. The wounds are basically in the same state as last time superficial friable easily bleeding wounds. Some hyper granulation. He has an abdominal binder that he got at [facility] however he says it is too tight for him to get on. Past medical history is  reviewed; he has coronary artery disease, stage III chronic renal failure heart failure with reduced ejection fraction, COPD, hypertension, obstructive sleep apnea. He was hospitalized since he was here last time for a severe pharyngitis. 11/19; the patient was in the ER on 11/16; this was for oozing from his abdominal wound. He was cauterized with quick clot gauze and epi and silver nitrate. This was felt to be venous oozing. He has very friable wounds in this area. He is using his abdominal binder. 12/3; the patient was in the ER with bleeding from the lower part of his incision in the abdomen on 11/28. Had a stitch placed to contain the bleeding area. This was just underneath his major wound. He saw general surgery yesterday and apparently we are to remove the stitch in 2 weeks. The patient is on Plavix and aspirin for coronary artery disease with stents. His bleeding was felt to be capillary. 12/17; I removed the stitch from the bleeding area that was placed last time. This came out with some difficulty. The major area almost looks like slough with an area of cellophane over it. I did not attempt any further debridement 02/28/2019. The patient has new areas superiorly and inferiorly to the major wound in the middle. The area in the middle has a nonviable surface over perhaps 60 to 70% however debridement causes uncontrolled bleeding here. He is on Plavix. We have been usin hydrogel and Prisma. 1/21; the patient has 2 superficial areas superiorly he has the major wound in the middle and a small area inferiorly in the middle of this abdominal wound. We switch to Iodoflex last time 2/4; the patient has 3 open areas. 2 of these are on the right part of this surgical wound on his abdomen and one more on the left central. The 2 on the right are hyper granulated I used silver nitrate on these. The area on the left appears to have a fibrinous surface. I attempted to debride this once he ended up in the ER.  I am reluctant to do this again. We have been using Iodoflex 2/25; he has 3 open areas small ones proximally and distally and then the larger one in the middle. This has nonviable debris over fibrinous debris over the surface. He is using Iodoflex 3/30; I follow this man monthly. He has wounds in the middle of the surgical scar in his abdomen. T oday 2 wounds which is an improvement. The major wound in the middle and one inferiorly and medially. 4/26; I follow this patient every month. This is a palliative setting. Wounds in the middle part of his surgical scar in the abdomen. He had significant amounts of denuded skin around the central area. This wipes off with Anasept and gauze. Really not any major improvement. We did get an abdominal binder for him at 1 point as usual patients do not wear these 07/16/19-Patient is back at 1 month, he has 3 small wounds in the center of his surgical scar on his abdomen, these appear to be slightly larger.  He is not using the abdominal binder as it is very uncomfortable 6/25; monthly follow-up. He is down to 2 open areas on the abdomen which appeared somewhat better today the larger area is superiorly and a small area inferiorly. There is debris on the surface of the superior 1 under illumination however very friable tissue. As opposed to what I said a month ago he is still using Iodoflex with some improvement in surface areas 7/23. Monthly follow-up. He has a central area that is still open and this abdominal scar. Not able to get on his abdominal binder that we got some time ago. He is simply growing out of it. One of her nurses suggested Montgomery straps he is going to try those. 9/2; monthly follow-up. He is using Montgomery straps. Actually his wound area looks as good as I have seen it. Still some dry flaking skin that does not appear to be robust but a lot of this is closed. Still using Iodoflex on the wound surface 10/7; we have made nice improvements in  the wound dimensions and the surface of this no longer has the adherent fibrinous debris he once had. I am reluctant to do mechanical debridement here because of the bleeding. He also has the Montgomery straps which have been very helpful I think in taking some of the torsion stress off the surface of this wound 11/15; superficial wounds x2 in the mid part of this wound. There is nothing that requires debridement under illumination this looks healthy. His abdomen is unchanged 12/13; 1 month follow-up. Dimensions are smaller. We have been using polymen. Original surgical wound Electronic Signature(s) Signed: 02/06/2020 8:09:40 AM By: Linton Ham MD Entered By: Linton Ham on 02/03/2020 11:13:33 -------------------------------------------------------------------------------- Physical Exam Details Patient Name: Date of Service: Lyman Bishop MS, BENFEA RD L. 02/03/2020 10:15 A M Medical Record Number: [id number] Patient Account Number: [id number] Date of Birth/Sex: Treating RN: 08-29-1950 (69 y.o. [name] Primary Care Provider: Lin Landsman Other Clinician: Referring Provider: Treating Provider/Extender: Nelida Meuse in Treatment: 56 Constitutional Sitting or standing Blood Pressure is within target range for patient.. Pulse regular and within target range for patient.[name] Kitchen Respirations regular, non-labored and within target range.. Temperature is normal and within the target range for the patient.[name] Kitchen Appears in no distress. Notes Wound exam; midline abdominal scar. He has a single opening remaining. Under illumination the surface is not completely vibrant however I elected not to debride this today partially because of the bleeding I once because debriding this man also the fact that he was measuring quite a bit smaller. The area has no evidence of infection. Part of this seems to be dry scaling skin on the margins. I did not remove this either Electronic  Signature(s) Signed: 02/06/2020 8:09:40 AM By: Linton Ham MD Entered By: Linton Ham on 02/03/2020 11:14:44 -------------------------------------------------------------------------------- Physician Orders Details Patient Name: Date of Service: Lyman Bishop MS, BENFEA RD L. 02/03/2020 10:15 A M Medical Record Number: [id number] Patient Account Number: [id number] Date of Birth/Sex: Treating RN: 05-Sep-1950 (69 y.o. [name] Primary Care Provider: Lin Landsman Other Clinician: Referring Provider: Treating Provider/Extender: Nelida Meuse in Treatment: 5 Verbal / Phone Orders: No Diagnosis Coding ICD-10 Coding Code Description T81.31XD Disruption of external operation (surgical) wound, not elsewhere classified, subsequent encounter S31.105S Unspecified open wound of abdominal wall, periumbilic region without penetration into peritoneal cavity, sequela Follow-up Appointments Return appointment in 1 month. Bathing/ Shower/ Hygiene May shower and wash wound with soap and water. - on  days that dressing is changed Additional Orders / Instructions Other: - Montgomery abdominal binder while up during the day, may remove at night. Wound Treatment Wound #9 - Abdomen - midline Wound Laterality: Distal Cleanser: Soap and Water Discharge Instructions: May shower and wash wound with dial antibacterial soap and water prior to dressing change. Prim Dressing: PolyMem Non-Adhesive Dressing, 4x4 in ary Discharge Instructions: Apply to wound bed as instructed Secondary Dressing: Woven Gauze Sponge, Non-Sterile 4x4 in Discharge Instructions: Apply over primary dressing as directed. Secondary Dressing: ABD Pad, 8x10 Discharge Instructions: Apply over primary dressing as directed. Secured With: 66M Medipore H Soft Cloth Surgical T 4 x 2 (in/yd) ape Discharge Instructions: Secure dressing with tape as directed. Electronic Signature(s) Signed: 02/04/2020 5:24:10 PM By:  Levan Hurst RN, BSN Signed: 02/06/2020 8:09:40 AM By: Linton Ham MD Entered By: Levan Hurst on 02/03/2020 11:05:59 -------------------------------------------------------------------------------- Problem List Details Patient Name: Date of Service: Lyman Bishop MS, BENFEA RD L. 02/03/2020 10:15 A M Medical Record Number: [id number] Patient Account Number: [id number] Date of Birth/Sex: Treating RN: [birth year] (69 y.o. [name] Primary Care Provider: Lin Landsman Other Clinician: Referring Provider: Treating Provider/Extender: Nelida Meuse in Treatment: 56 Active Problems ICD-10 Encounter Code Description Active Date MDM Diagnosis T81.31XD Disruption of external operation (surgical) wound, not elsewhere classified, 01/04/2019 No Yes subsequent encounter S31.105S Unspecified open wound of abdominal wall, periumbilic region without 95/62/1308 No Yes penetration into peritoneal cavity, sequela Inactive Problems Resolved Problems Electronic Signature(s) Signed: 02/06/2020 8:09:40 AM By: Linton Ham MD Entered By: Linton Ham on 02/03/2020 11:12:56 -------------------------------------------------------------------------------- Progress Note Details Patient Name: Date of Service: Lyman Bishop MS, BENFEA RD L. 02/03/2020 10:15 A M Medical Record Number: [id number] Patient Account Number: [id number] Date of Birth/Sex: Treating RN: 1950-09-05 (69 y.o. [name] Primary Care Provider: Lin Landsman Other Clinician: Referring Provider: Treating Provider/Extender: Nelida Meuse in Treatment: 56 Subjective History of Present Illness (HPI) The following HPI elements were documented for the patient's wound: Location: Patient presents with a wound to left lower leg. Quality: Patient reports No Pain. Severity: Patient states wound(s) are getting worse. Duration: Patient has had the wound for < 2 weeks prior to presenting  for treatment Modifying Factors: pvd pt smoked until 5 years ago. cancer surgery 1 year ago. has ruq stoma. cv studies last year revealed non-compressible vessels. recent pe by pcp yielded some heart abnormality and he is scheduled for stress test. 5/24 2 new wounds on rt leg. left side healed 08/12/14; several small wounds on the right lateral leg and one wound on the left leg which is new. The edema control here does not look adequate. There is also some degree of maceration around the wounds on the right lateral leg READMISSION 09/14/2018 This is a patient we had in this clinic in 2013 and then again in 2016 with wounds on his right lateral leg secondary to chronic venous insufficiency. He was cared for I believe in 2016 by Dr. Lindon Romp. The patient is here for review of 6 small wounds within a large surgical area on his abdomen. The patient had surgery for a malignant neoplasm of the sigmoid colon with a colostomy. I cannot see the date of the exact surgery in epic. In February 2018 he had an attempted colostomy reversal however he had a microperforation I believe at the anastomosis. He required a repeat laparotomy. He had an ileostomy raised at that time. I believe the surgical wound at that time was left to close via  secondary intention. The patient is unaware whether he had a wound VAC on this. In any case this almost is completely closed except he has been left with 6 small eraser head sized areas on the abdomen that have not healed. I am not exactly sure how he was how he is been dressing this. He has been followed with frequent visits in general surgery dating back at least over a year. Past medical history includes chronic systolic heart failure, stage III chronic renal failure. Malignant neoplasm of the sigmoid colon, morbid obesity. 7/31; not a lot of change 6 small wounds within a large surgical area on his abdomen. None of these are hyper granulated as opposed to last week. None of them  have exposed mesh or surrounding infection. He has been using [facility] 8/14- He comes after 2 weeks, he has only 2 open wounds that I can tell this time, he has been using Hydrofera Blue 8/28; 2-week follow-up. Very superficial areas on the abdominal surgical site scar. He is using Hydrofera Blue and this seems to be doing a good job. He has not made it down to the medical supply store to see if he can get an abdominal binder or something to keep the tension off this wound area. READMISSION 01/04/2019 This is a 69 year old man we had in the clinic from July through August 2020. He had wounds in the midline abdominal scar. [Please see history from my note of 09/14/2018}. When he was here last time we used Hydrofera Blue. Things seem to be improving. He did not discharge in a healed state his last visit here was on 8/28. He states he continued to use the [facility] and at 1 point this was very close to healing but more recently it is reopened. The wounds are basically in the same state as last time superficial friable easily bleeding wounds. Some hyper granulation. He has an abdominal binder that he got at [facility] however he says it is too tight for him to get on. Past medical history is reviewed; he has coronary artery disease, stage III chronic renal failure heart failure with reduced ejection fraction, COPD, hypertension, obstructive sleep apnea. He was hospitalized since he was here last time for a severe pharyngitis. 11/19; the patient was in the ER on 11/16; this was for oozing from his abdominal wound. He was cauterized with quick clot gauze and epi and silver nitrate. This was felt to be venous oozing. He has very friable wounds in this area. He is using his abdominal binder. 12/3; the patient was in the ER with bleeding from the lower part of his incision in the abdomen on 11/28. Had a stitch placed to contain the bleeding area. This was just underneath his major wound. He saw  general surgery yesterday and apparently we are to remove the stitch in 2 weeks. The patient is on Plavix and aspirin for coronary artery disease with stents. His bleeding was felt to be capillary. 12/17; I removed the stitch from the bleeding area that was placed last time. This came out with some difficulty. The major area almost looks like slough with an area of cellophane over it. I did not attempt any further debridement 02/28/2019. The patient has new areas superiorly and inferiorly to the major wound in the middle. The area in the middle has a nonviable surface over perhaps 60 to 70% however debridement causes uncontrolled bleeding here. He is on Plavix. We have been usin hydrogel and Prisma. 1/21; the  patient has 2 superficial areas superiorly he has the major wound in the middle and a small area inferiorly in the middle of this abdominal wound. We switch to Iodoflex last time 2/4; the patient has 3 open areas. 2 of these are on the right part of this surgical wound on his abdomen and one more on the left central. The 2 on the right are hyper granulated I used silver nitrate on these. The area on the left appears to have a fibrinous surface. I attempted to debride this once he ended up in the ER. I am reluctant to do this again. We have been using Iodoflex 2/25; he has 3 open areas small ones proximally and distally and then the larger one in the middle. This has nonviable debris over fibrinous debris over the surface. He is using Iodoflex 3/30; I follow this man monthly. He has wounds in the middle of the surgical scar in his abdomen. T oday 2 wounds which is an improvement. The major wound in the middle and one inferiorly and medially. 4/26; I follow this patient every month. This is a palliative setting. Wounds in the middle part of his surgical scar in the abdomen. He had significant amounts of denuded skin around the central area. This wipes off with Anasept and gauze. Really not any major  improvement. We did get an abdominal binder for him at 1 point as usual patients do not wear these 07/16/19-Patient is back at 1 month, he has 3 small wounds in the center of his surgical scar on his abdomen, these appear to be slightly larger. He is not using the abdominal binder as it is very uncomfortable 6/25; monthly follow-up. He is down to 2 open areas on the abdomen which appeared somewhat better today the larger area is superiorly and a small area inferiorly. There is debris on the surface of the superior 1 under illumination however very friable tissue. As opposed to what I said a month ago he is still using Iodoflex with some improvement in surface areas 7/23. Monthly follow-up. He has a central area that is still open and this abdominal scar. Not able to get on his abdominal binder that we got some time ago. He is simply growing out of it. One of her nurses suggested Montgomery straps he is going to try those. 9/2; monthly follow-up. He is using Montgomery straps. Actually his wound area looks as good as I have seen it. Still some dry flaking skin that does not appear to be robust but a lot of this is closed. Still using Iodoflex on the wound surface 10/7; we have made nice improvements in the wound dimensions and the surface of this no longer has the adherent fibrinous debris he once had. I am reluctant to do mechanical debridement here because of the bleeding. He also has the Montgomery straps which have been very helpful I think in taking some of the torsion stress off the surface of this wound 11/15; superficial wounds x2 in the mid part of this wound. There is nothing that requires debridement under illumination this looks healthy. His abdomen is unchanged 12/13; 1 month follow-up. Dimensions are smaller. We have been using polymen. Original surgical wound Objective Constitutional Sitting or standing Blood Pressure is within target range for patient.. Pulse regular and within  target range for patient.[name] Kitchen Respirations regular, non-labored and within target range.. Temperature is normal and within the target range for the patient.[name] Kitchen Appears in no distress. Vitals Time Taken: 10:21 AM, Height:  71 in, Weight: 344 lbs, BMI: 48, Temperature: 98.3 F, Pulse: 99 bpm, Respiratory Rate: 18 breaths/min, Blood Pressure: 130/79 mmHg. General Notes: Wound exam; midline abdominal scar. He has a single opening remaining. Under illumination the surface is not completely vibrant however I elected not to debride this today partially because of the bleeding I once because debriding this man also the fact that he was measuring quite a bit smaller. The area has no evidence of infection. Part of this seems to be dry scaling skin on the margins. I did not remove this either Integumentary (Hair, Skin) Wound #9 status is Open. Original cause of wound was Gradually Appeared. The wound is located on the Distal Abdomen - midline. The wound measures 3cm length x 1cm width x 0.1cm depth; 2.356cm^2 area and 0.236cm^3 volume. There is Fat Layer (Subcutaneous Tissue) exposed. There is no tunneling or undermining noted. There is a medium amount of serosanguineous drainage noted. The wound margin is flat and intact. There is large (67-100%) pink granulation within the wound bed. There is no necrotic tissue within the wound bed. Assessment Active Problems ICD-10 Disruption of external operation (surgical) wound, not elsewhere classified, subsequent encounter Unspecified open wound of abdominal wall, periumbilic region without penetration into peritoneal cavity, sequela Plan Follow-up Appointments: Return appointment in 1 month. Bathing/ Shower/ Hygiene: May shower and wash wound with soap and water. - on days that dressing is changed Additional Orders / Instructions: Other: - Montgomery abdominal binder while up during the day, may remove at night. WOUND #9: - Abdomen - midline Wound Laterality:  Distal Cleanser: Soap and Water Discharge Instructions: May shower and wash wound with dial antibacterial soap and water prior to dressing change. Prim Dressing: PolyMem Non-Adhesive Dressing, 4x4 in ary Discharge Instructions: Apply to wound bed as instructed Secondary Dressing: Woven Gauze Sponge, Non-Sterile 4x4 in Discharge Instructions: Apply over primary dressing as directed. Secondary Dressing: ABD Pad, 8x10 Discharge Instructions: Apply over primary dressing as directed. Secured With: 110M Medipore H Soft Cloth Surgical T 4 x 2 (in/yd) ape Discharge Instructions: Secure dressing with tape as directed. #1 we are going to continue to use polymen. He has the same compression wraps around the wound. These really seem to have helped 2. The surface of the wound looked marginally however with the improvement in surface area I like elected not to debride this. Electronic Signature(s) Signed: 02/06/2020 8:09:40 AM By: Linton Ham MD Signed: 02/06/2020 8:09:40 AM By: Linton Ham MD Entered By: Linton Ham on 02/03/2020 11:16:33 -------------------------------------------------------------------------------- SuperBill Details Patient Name: Date of Service: Lyman Bishop MS, BENFEA RD L. 02/03/2020 Medical Record Number: [id number] Patient Account Number: [id number] Date of Birth/Sex: Treating RN: 03-Sep-1950 (69 y.o. [name] Primary Care Provider: Lin Landsman Other Clinician: Referring Provider: Treating Provider/Extender: Nelida Meuse in Treatment: 56 Diagnosis Coding ICD-10 Codes Code Description T81.31XD Disruption of external operation (surgical) wound, not elsewhere classified, subsequent encounter S31.105S Unspecified open wound of abdominal wall, periumbilic region without penetration into peritoneal cavity, sequela Facility Procedures The patient participates with Medicare or their insurance follows the Medicare Facility Guidelines: CPT4  Code Description Modifier Quantity [id number] [place] VISIT-LEV 3 EST PT 1 Physician Procedures : CPT4 Code Description Modifier [id number] - WC PHYS LEVEL 3 - EST PT ICD-10 Diagnosis Description T81.31XD Disruption of external operation (surgical) wound, not elsewhere classified, subsequent encounter S31.105S Unspecified open wound of abdominal  wall, periumbilic region without penetration into peritoneal cavi Quantity: 1 ty, sequela Electronic Signature(s) Signed:  02/04/2020 5:24:10 PM By: Levan Hurst RN, BSN Signed: 02/06/2020 8:09:40 AM By: Linton Ham MD Entered By: Levan Hurst on 02/04/2020 07:31:44

## 2020-02-18 ENCOUNTER — Ambulatory Visit
Admission: RE | Admit: 2020-02-18 | Discharge: 2020-02-18 | Disposition: A | Discharge status: Home / Self Care | Payer: Medicare Other | Source: Ambulatory Visit | Attending: Nephrology | Admitting: Nephrology

## 2020-02-18 DIAGNOSIS — R809 Proteinuria, unspecified: Secondary | ICD-10-CM

## 2020-02-18 DIAGNOSIS — N183 Chronic kidney disease, stage 3 unspecified: Secondary | ICD-10-CM

## 2020-02-28 ENCOUNTER — Other Ambulatory Visit: Payer: Self-pay | Admitting: Nephrology

## 2020-02-28 DIAGNOSIS — N281 Cyst of kidney, acquired: Secondary | ICD-10-CM

## 2020-03-02 ENCOUNTER — Other Ambulatory Visit: Payer: Self-pay

## 2020-03-02 ENCOUNTER — Encounter (HOSPITAL_BASED_OUTPATIENT_CLINIC_OR_DEPARTMENT_OTHER): Payer: Medicare Other | Attending: Internal Medicine | Admitting: Internal Medicine

## 2020-03-02 DIAGNOSIS — I130 Hypertensive heart and chronic kidney disease with heart failure and stage 1 through stage 4 chronic kidney disease, or unspecified chronic kidney disease: Secondary | ICD-10-CM | POA: Diagnosis not present

## 2020-03-02 DIAGNOSIS — Z7982 Long term (current) use of aspirin: Secondary | ICD-10-CM | POA: Diagnosis not present

## 2020-03-02 DIAGNOSIS — T8131XA Disruption of external operation (surgical) wound, not elsewhere classified, initial encounter: Secondary | ICD-10-CM | POA: Diagnosis not present

## 2020-03-02 DIAGNOSIS — Z85038 Personal history of other malignant neoplasm of large intestine: Secondary | ICD-10-CM | POA: Insufficient documentation

## 2020-03-02 DIAGNOSIS — Z955 Presence of coronary angioplasty implant and graft: Secondary | ICD-10-CM | POA: Insufficient documentation

## 2020-03-02 DIAGNOSIS — Z7902 Long term (current) use of antithrombotics/antiplatelets: Secondary | ICD-10-CM | POA: Diagnosis not present

## 2020-03-02 DIAGNOSIS — Z86711 Personal history of pulmonary embolism: Secondary | ICD-10-CM | POA: Diagnosis not present

## 2020-03-02 DIAGNOSIS — Z6841 Body Mass Index (BMI) 40.0 and over, adult: Secondary | ICD-10-CM | POA: Insufficient documentation

## 2020-03-02 DIAGNOSIS — I5022 Chronic systolic (congestive) heart failure: Secondary | ICD-10-CM | POA: Insufficient documentation

## 2020-03-02 DIAGNOSIS — J449 Chronic obstructive pulmonary disease, unspecified: Secondary | ICD-10-CM | POA: Insufficient documentation

## 2020-03-02 DIAGNOSIS — Y838 Other surgical procedures as the cause of abnormal reaction of the patient, or of later complication, without mention of misadventure at the time of the procedure: Secondary | ICD-10-CM | POA: Insufficient documentation

## 2020-03-02 DIAGNOSIS — I2510 Atherosclerotic heart disease of native coronary artery without angina pectoris: Secondary | ICD-10-CM | POA: Insufficient documentation

## 2020-03-02 DIAGNOSIS — I872 Venous insufficiency (chronic) (peripheral): Secondary | ICD-10-CM | POA: Diagnosis not present

## 2020-03-02 DIAGNOSIS — Z87891 Personal history of nicotine dependence: Secondary | ICD-10-CM | POA: Diagnosis not present

## 2020-03-02 DIAGNOSIS — I739 Peripheral vascular disease, unspecified: Secondary | ICD-10-CM | POA: Diagnosis not present

## 2020-03-02 DIAGNOSIS — N183 Chronic kidney disease, stage 3 unspecified: Secondary | ICD-10-CM | POA: Diagnosis not present

## 2020-03-05 NOTE — Progress Notes (Signed)
Weitz, Verl L. ([id number]) Visit Report for 03/02/2020 Arrival Information Details Patient Name: Date of Service: Lyman Bishop MS, [facility] RD L. 03/02/2020 10:30 A M Medical Record Number: [id number] Patient Account Number: [id number] Date of Birth/Sex: Treating RN: May 19, 1950 (70 y.o. Burnadette Pop, Lauren Primary Care Yasaman Kolek: Lin Landsman Other Clinician: Referring Tylee Yum: Treating Ulyana Pitones/Extender: Nelida Meuse in Treatment: 71 Visit Information History Since Last Visit Added or deleted any medications: No Patient Arrived: Ambulatory Any new allergies or adverse reactions: No Arrival Time: 10:57 Had a fall or experienced change in No Accompanied By: self activities of daily living that may affect Transfer Assistance: None risk of falls: Patient Identification Verified: Yes Signs or symptoms of abuse/neglect since last visito No Secondary Verification Process Completed: Yes Hospitalized since last visit: No Patient Requires Transmission-Based Precautions: No Implantable device outside of the clinic excluding No Patient Has Alerts: No cellular tissue based products placed in the center since last visit: Has Dressing in Place as Prescribed: Yes Pain Present Now: No Electronic Signature(s) Signed: 03/03/2020 5:35:07 PM By: Rhae Hammock RN Entered By: Rhae Hammock on 03/02/2020 10:58:05 -------------------------------------------------------------------------------- Encounter Discharge Information Details Patient Name: Date of Service: Lyman Bishop MS, BENFEA RD L. 03/02/2020 10:30 A M Medical Record Number: [id number] Patient Account Number: [id number] Date of Birth/Sex: Treating RN: 05/01/1950 (70 y.o. [name] Primary Care Lyly Canizales: Lin Landsman Other Clinician: Referring Merville Hijazi: Treating Hailei Besser/Extender: Nelida Meuse in Treatment: 60 Encounter Discharge Information Items Post Procedure Vitals Discharge  Condition: Stable Temperature (F): 98.4 Ambulatory Status: Ambulatory Pulse (bpm): 103 Discharge Destination: Home Respiratory Rate (breaths/min): 18 Transportation: Private Auto Blood Pressure (mmHg): 104/66 Accompanied By: self Schedule Follow-up Appointment: Yes Clinical Summary of Care: Electronic Signature(s) Signed: 03/02/2020 5:17:21 PM By: Deon Pilling Entered By: Deon Pilling on 03/02/2020 12:10:20 -------------------------------------------------------------------------------- Lower Extremity Assessment Details Patient Name: Date of Service: Orpah Greek RD L. 03/02/2020 10:30 A M Medical Record Number: [id number] Patient Account Number: [id number] Date of Birth/Sex: Treating RN: 01-Apr-1950 (70 y.o. Erie Noe Primary Care Hye Trawick: Lin Landsman Other Clinician: Referring Rianne Degraaf: Treating Angeletta Goelz/Extender: Nelida Meuse in Treatment: 60 Electronic Signature(s) Signed: 03/03/2020 5:35:07 PM By: Rhae Hammock RN Entered By: Rhae Hammock on 03/02/2020 10:59:46 -------------------------------------------------------------------------------- Multi Wound Chart Details Patient Name: Date of Service: Lyman Bishop MS, BENFEA RD L. 03/02/2020 10:30 A M Medical Record Number: [id number] Patient Account Number: [id number] Date of Birth/Sex: Treating RN: November 01, 1950 (70 y.o. [name] Primary Care Prerana Strayer: Lin Landsman Other Clinician: Referring Wesam Gearhart: Treating Kelten Enochs/Extender: Nelida Meuse in Treatment: 60 Vital Signs Height(in): 71 Pulse(bpm): 104 Weight(lbs): 344 Blood Pressure(mmHg): 104/66 Body Mass Index(BMI): 48 Temperature(F): 98.3 Respiratory Rate(breaths/min): 18 Photos: [9:No Photos Distal Abdomen - midline] [N/A:N/A N/A] Wound Location: [9:Gradually Appeared] [N/A:N/A] Wounding Event: [9:Open Surgical Wound] [N/A:N/A] Primary Etiology: [9:Asthma, Chronic Obstructive]  [N/A:N/A] Comorbid History: [9:Pulmonary Disease (COPD), Sleep Apnea, Arrhythmia, Congestive Heart Failure, Coronary Artery Disease, Hypertension, Peripheral Arterial Disease, Peripheral Venous Disease 02/28/2019] [N/A:N/A] Date Acquired: [9:52] [N/A:N/A] Weeks of Treatment: [9:Open] [N/A:N/A] Wound Status: [9:6x3x0.1] [N/A:N/A] Measurements L x W x D (cm) [9:14.137] [N/A:N/A] A (cm) : rea [9:1.414] [N/A:N/A] Volume (cm) : [9:-1400.70%] [N/A:N/A] % Reduction in Area: [9:-1404.30%] [N/A:N/A] % Reduction in Volume: [9:Full Thickness Without Exposed] [N/A:N/A] Classification: [9:Support Structures Medium] [N/A:N/A] Exudate Amount: [9:Serosanguineous] [N/A:N/A] Exudate Type: [9:red, brown] [N/A:N/A] Exudate Color: [9:Flat and Intact] [N/A:N/A] Wound Margin: [9:Large (67-100%)] [N/A:N/A] Granulation Amount: [9:Red, Pink] [N/A:N/A] Granulation Quality: [9:None Present (0%)] [N/A:N/A] Necrotic Amount: [9:Fat Layer (Subcutaneous Tissue):  Yes N/A] Exposed Structures: [9:Fascia: No Tendon: No Muscle: No Joint: No Bone: No Medium (34-66%)] [N/A:N/A] Epithelialization: [9:Chemical/Enzymatic/Mechanical] [N/A:N/A] Debridement: [9:11:32] [N/A:N/A] Pre-procedure Verification/Time Out Taken: [9:N/A] [N/A:N/A] Instrument: [9:Minimum] [N/A:N/A] Bleeding: [9:Pressure] [N/A:N/A] Hemostasis A chieved: [9:0] [N/A:N/A] Procedural Pain: [9:0] [N/A:N/A] Post Procedural Pain: [9:Procedure was tolerated well] [N/A:N/A] Debridement Treatment Response: [9:6x3x0.1] [N/A:N/A] Post Debridement Measurements L x W x D (cm) [9:1.414] [N/A:N/A] Post Debridement Volume: (cm) [9:Debridement] [N/A:N/A] Treatment Notes Electronic Signature(s) Signed: 03/02/2020 5:04:19 PM By: Levan Hurst RN, BSN Signed: 03/05/2020 5:01:02 PM By: Linton Ham MD Entered By: Linton Ham on 03/02/2020 11:33:31 -------------------------------------------------------------------------------- Multi-Disciplinary Care Plan  Details Patient Name: Date of Service: Lyman Bishop MS, BENFEA RD L. 03/02/2020 10:30 A M Medical Record Number: [id number] Patient Account Number: [id number] Date of Birth/Sex: Treating RN: May 18, 1950 (70 y.o. [name] Primary Care Eilyn Polack: Lin Landsman Other Clinician: Referring Kempton Milne: Treating Cledis Sohn/Extender: Nelida Meuse in Treatment: 60 Active Inactive Wound/Skin Impairment Nursing Diagnoses: Impaired tissue integrity Goals: Patient/caregiver will verbalize understanding of skin care regimen Date Initiated: 10/24/2019 Target Resolution Date: 04/03/2020 Goal Status: Active Ulcer/skin breakdown will have a volume reduction of 30% by week 4 Date Initiated: 01/04/2019 Date Inactivated: 03/28/2019 Target Resolution Date: 04/05/2019 Unmet Reason: no change see wound Goal Status: Unmet size at week 4. Interventions: Provide education on ulcer and skin care Notes: Electronic Signature(s) Signed: 03/02/2020 5:04:19 PM By: Levan Hurst RN, BSN Entered By: Levan Hurst on 03/02/2020 16:22:46 -------------------------------------------------------------------------------- Pain Assessment Details Patient Name: Date of Service: Lyman Bishop MS, BENFEA RD L. 03/02/2020 10:30 A M Medical Record Number: [id number] Patient Account Number: [id number] Date of Birth/Sex: Treating RN: 1950-05-16 (70 y.o. Erie Noe Primary Care Chaitanya Amedee: Lin Landsman Other Clinician: Referring Alisah Grandberry: Treating Akari Crysler/Extender: Nelida Meuse in Treatment: 60 Active Problems Location of Pain Severity and Description of Pain Patient Has Paino No Site Locations Rate the pain. Current Pain Level: 0 Pain Management and Medication Current Pain Management: Electronic Signature(s) Signed: 03/03/2020 5:35:07 PM By: Rhae Hammock RN Entered By: Rhae Hammock on 03/02/2020  10:59:39 -------------------------------------------------------------------------------- Patient/Caregiver Education Details Patient Name: Date of Service: Gladys Damme, [place] 1/10/2022andnbsp10:30 [place] Record Number: [id number] Patient Account Number: [id number] Date of Birth/Gender: Treating RN: [birth year] (70 y.o. [name] Primary Care Physician: Lin Landsman Other Clinician: Referring Physician: Treating Physician/Extender: Nelida Meuse in Treatment: 60 Education Assessment Education Provided To: Patient Education Topics Provided Wound/Skin Impairment: Methods: Explain/Verbal Responses: State content correctly [employer]) Signed: 03/02/2020 5:04:19 PM By: Levan Hurst RN, BSN Entered By: Levan Hurst on 03/02/2020 16:23:01 -------------------------------------------------------------------------------- Wound Assessment Details Patient Name: Date of Service: Lyman Bishop MS, BENFEA RD L. 03/02/2020 10:30 A M Medical Record Number: [id number] Patient Account Number: [id number] Date of Birth/Sex: Treating RN: 05/31/1950 (70 y.o. Burnadette Pop, Lauren Primary Care Evelynn Hench: Lin Landsman Other Clinician: Referring Dalissa Lovin: Treating Franki Stemen/Extender: Nelida Meuse in Treatment: 60 Wound Status Wound Number: 9 Primary Open Surgical Wound Etiology: Wound Location: Distal Abdomen - midline Wound Open Wounding Event: Gradually Appeared Status: Date Acquired: 02/28/2019 Comorbid Asthma, Chronic Obstructive Pulmonary Disease (COPD), Sleep Weeks Of Treatment: 52 History: Apnea, Arrhythmia, Congestive Heart Failure, Coronary Artery Clustered Wound: No Disease, Hypertension, Peripheral Arterial Disease, Peripheral Venous Disease Photos Photo Uploaded By: Mikeal Hawthorne on 03/03/2020 13:32:18 Wound Measurements Length: (cm) 6 Width: (cm) 3 Depth: (cm) 0.1 Area: (cm) 14.137 Volume: (cm) 1.414 %  Reduction in Area: -1400.7% % Reduction in Volume: -1404.3% Epithelialization: Medium (34-66%) Tunneling: No Undermining:  No Wound Description Classification: Full Thickness Without Exposed Support Structures Wound Margin: Flat and Intact Exudate Amount: Medium Exudate Type: Serosanguineous Exudate Color: red, brown Foul Odor After Cleansing: No Slough/Fibrino No Wound Bed Granulation Amount: Large (67-100%) Exposed Structure Granulation Quality: Red, Pink Fascia Exposed: No Necrotic Amount: None Present (0%) Fat Layer (Subcutaneous Tissue) Exposed: Yes Tendon Exposed: No Muscle Exposed: No Joint Exposed: No Bone Exposed: No Treatment Notes Wound #9 (Abdomen - midline) Wound Laterality: Distal Cleanser Soap and Water Discharge Instruction: May shower and wash wound with dial antibacterial soap and water prior to dressing change. Peri-Wound Care Topical Primary Dressing Maxorb Extra Calcium Alginate Dressing, 4x4 in Discharge Instruction: Apply calcium alginate to wound bed as instructed Secondary Dressing Woven Gauze Sponge, Non-Sterile 4x4 in Discharge Instruction: Apply over primary dressing as directed. ABD Pad, 5x9 Discharge Instruction: Apply over primary dressing as directed. Secured With 3M Medipore H Soft Cloth Surgical T 4 x 2 (in/yd) ape Discharge Instruction: Secure dressing with tape as directed. Compression Wrap Compression Stockings Add-Ons Electronic Signature(s) Signed: 03/03/2020 5:35:07 PM By: Rhae Hammock RN Entered By: Rhae Hammock on 03/02/2020 11:03:36 -------------------------------------------------------------------------------- Vitals Details Patient Name: Date of Service: Lyman Bishop MS, BENFEA RD L. 03/02/2020 10:30 A M Medical Record Number: [id number] Patient Account Number: [id number] Date of Birth/Sex: Treating RN: Dec 17, 1950 (70 y.o. Burnadette Pop, Lauren Primary Care Hovanes Hymas: Lin Landsman Other Clinician: Referring  Alesi Zachery: Treating Anjelika Ausburn/Extender: Nelida Meuse in Treatment: 60 Vital Signs Time Taken: 10:58 Temperature (F): 98.3 Height (in): 71 Pulse (bpm): 104 Weight (lbs): 344 Respiratory Rate (breaths/min): 18 Body Mass Index (BMI): 48 Blood Pressure (mmHg): 104/66 Reference Range: 80 - 120 mg / dl Electronic Signature(s) Signed: 03/03/2020 5:35:07 PM By: Rhae Hammock RN Entered By: Rhae Hammock on 03/02/2020 10:58:58

## 2020-03-05 NOTE — Progress Notes (Signed)
Bartolomei, Tylee L. ([id number]) Visit Report for 03/02/2020 Debridement Details Patient Name: Date of [facility]: [name]/02/2020 10:30 A M Medical Record Number: [id number] Patient Account Number: [id number] Date of Birth/Sex: Treating RN: 1950-08-14 (70 y.o. Larry [name]) Lynch, Shatara Primary Care Provider: Leilani [name], [name] Other Clinician: Referring Provider: Treating Provider/Extender: Larry [name] in Treatment: 60 Debridement Performed for Assessment: Wound #9 Distal Abdomen - midline Performed By: Clinician Larry [name], Shatara, RN Debridement Type: Chemical/Enzymatic/Mechanical Agent Used: Anasept and gauze to remove biofilm Level of Consciousness (Pre-procedure): Awake and Alert Pre-procedure Verification/Time Out Yes - 11:32 Taken: Start Time: 11:32 Bleeding: Minimum Hemostasis Achieved: Pressure End Time: 11:32 Procedural Pain: 0 Post Procedural Pain: 0 Response to Treatment: Procedure was tolerated well Level of Consciousness (Post- Awake and Alert procedure): Post Debridement Measurements of Total Wound Length: (cm) 6 Width: (cm) 3 Depth: (cm) 0.1 Volume: (cm) 1.414 Character of Wound/Ulcer Post Debridement: Improved Post Procedure Diagnosis Same as Pre-procedure Electronic Signature(s) Signed: 03/02/2020 5:04:19 PM By: Larry [name], Shatara RN, BSN Signed: 03/05/2020 5:01:02 PM By: Larry [name], Larry Salberg MD Entered By: Larry [name], Larry [name] on 03/02/2020 11:34:08 -------------------------------------------------------------------------------- HPI Details Patient Name: Date of Service: Larry [name], BENFEA RD L. 03/02/2020 10:30 A M Medical Record Number: [id number] Patient Account Number: [id number] Date of Birth/Sex: Treating RN: 1950-08-14 (70 y.o. Larry [name]) Lynch, Shatara Primary Care Provider: Leilani [name], [name] Other Clinician: Referring Provider: Treating Provider/Extender: Larry Lawrenceobson, Zakariah Urwin Reese, [name] [name] in Treatment: 60 History of Present Illness Location:  Patient presents with a wound to left lower leg. Quality: Patient reports No Pain. Severity: Patient states wound(s) are getting worse. Duration: Patient has had the wound for < 2 [name] prior to presenting for treatment Modifying Factors: pvd HPI Description: pt smoked until 5 years ago. cancer surgery 1 year ago. has ruq stoma. cv studies last year revealed non-compressible vessels. recent pe by pcp yielded some heart abnormality and he is scheduled for stress test. 5/24 2 new wounds on rt leg. left side healed 08/12/14; several small wounds on the right lateral leg and one wound on the left leg which is new. The edema control here does not look adequate. There is also some degree of maceration around the wounds on the right lateral leg READMISSION 09/14/2018 This is a patient we had in this clinic in 2013 and then again in 2016 with wounds on his right lateral leg secondary to chronic venous insufficiency. He was cared for I believe in 2016 by Dr. Wiliam KeArkin. The patient is here for review of 6 small wounds within a large surgical area on his abdomen. The patient had surgery for a malignant neoplasm of the sigmoid colon with a colostomy. I cannot see the date of the exact surgery in epic. In February 2018 he had an attempted colostomy reversal however he had a microperforation I believe at the anastomosis. He required a repeat laparotomy. He had an ileostomy raised at that time. I believe the surgical wound at that time was left to close via secondary intention. The patient is unaware whether he had a wound VAC on this. In any case this almost is completely closed except he has been left with 6 small eraser head sized areas on the abdomen that have not healed. I am not exactly sure how he was how he is been dressing this. He has been followed with frequent visits in general surgery dating back at least over a year. Past medical history includes chronic systolic heart failure, stage III chronic renal  failure. Malignant neoplasm  of the sigmoid colon, morbid obesity. 7/31; not a lot of change 6 small wounds within a large surgical area on his abdomen. None of these are hyper granulated as opposed to last week. None of them have exposed mesh or surrounding infection. He has been using [facility] 8/14- He comes after 2 [name], he has only 2 open wounds that I can tell this time, he has been using Hydrofera Blue 8/28; 2-week follow-up. Very superficial areas on the abdominal surgical site scar. He is using Hydrofera Blue and this seems to be doing a good job. He has not made it down to the medical supply store to see if he can get an abdominal binder or something to keep the tension off this wound area. READMISSION 01/04/2019 This is a 70 year old man we had in the clinic from July through August 2020. He had wounds in the midline abdominal scar. [Please see history from my note of 09/14/2018}. When he was here last time we used Hydrofera Blue. Things seem to be improving. He did not discharge in a healed state his last visit here was on 8/28. He states he continued to use the [facility] and at 1 point this was very close to healing but more recently it is reopened. The wounds are basically in the same state as last time superficial friable easily bleeding wounds. Some hyper granulation. He has an abdominal binder that he got at [facility] however he says it is too tight for him to get on. Past medical history is reviewed; he has coronary artery disease, stage III chronic renal failure heart failure with reduced ejection fraction, COPD, hypertension, obstructive sleep apnea. He was hospitalized since he was here last time for a severe pharyngitis. 11/19; the patient was in the ER on 11/16; this was for oozing from his abdominal wound. He was cauterized with quick clot gauze and epi and silver nitrate. This was felt to be venous oozing. He has very friable wounds in this area. He is using his  abdominal binder. 12/3; the patient was in the ER with bleeding from the lower part of his incision in the abdomen on 11/28. Had a stitch placed to contain the bleeding area. This was just underneath his major wound. He saw general surgery yesterday and apparently we are to remove the stitch in 2 [name]. The patient is on Plavix and aspirin for coronary artery disease with stents. His bleeding was felt to be capillary. 12/17; I removed the stitch from the bleeding area that was placed last time. This came out with some difficulty. The major area almost looks like slough with an area of cellophane over it. I did not attempt any further debridement 02/28/2019. The patient has new areas superiorly and inferiorly to the major wound in the middle. The area in the middle has a nonviable surface over perhaps 60 to 70% however debridement causes uncontrolled bleeding here. He is on Plavix. We have been usin hydrogel and Prisma. 1/21; the patient has 2 superficial areas superiorly he has the major wound in the middle and a small area inferiorly in the middle of this abdominal wound. We switch to Iodoflex last time 2/4; the patient has 3 open areas. 2 of these are on the right part of this surgical wound on his abdomen and one more on the left central. The 2 on the right are hyper granulated I used silver nitrate on these. The area on the left appears to have a fibrinous surface. I attempted  to debride this once he ended up in the ER. I am reluctant to do this again. We have been using Iodoflex 2/25; he has 3 open areas small ones proximally and distally and then the larger one in the middle. This has nonviable debris over fibrinous debris over the surface. He is using Iodoflex 3/30; I follow this man monthly. He has wounds in the middle of the surgical scar in his abdomen. T oday 2 wounds which is an improvement. The major wound in the middle and one inferiorly and medially. 4/26; I follow this patient every  month. This is a palliative setting. Wounds in the middle part of his surgical scar in the abdomen. He had significant amounts of denuded skin around the central area. This wipes off with Anasept and gauze. Really not any major improvement. We did get an abdominal binder for him at 1 point as usual patients do not wear these 07/16/19-Patient is back at 1 month, he has 3 small wounds in the center of his surgical scar on his abdomen, these appear to be slightly larger. He is not using the abdominal binder as it is very uncomfortable 6/25; monthly follow-up. He is down to 2 open areas on the abdomen which appeared somewhat better today the larger area is superiorly and a small area inferiorly. There is debris on the surface of the superior 1 under illumination however very friable tissue. As opposed to what I said a month ago he is still using Iodoflex with some improvement in surface areas 7/23. Monthly follow-up. He has a central area that is still open and this abdominal scar. Not able to get on his abdominal binder that we got some time ago. He is simply growing out of it. One of her nurses suggested Montgomery straps he is going to try those. 9/2; monthly follow-up. He is using Montgomery straps. Actually his wound area looks as good as I have seen it. Still some dry flaking skin that does not appear to be robust but a lot of this is closed. Still using Iodoflex on the wound surface 10/7; we have made nice improvements in the wound dimensions and the surface of this no longer has the adherent fibrinous debris he once had. I am reluctant to do mechanical debridement here because of the bleeding. He also has the Montgomery straps which have been very helpful I think in taking some of the torsion stress off the surface of this wound 11/15; superficial wounds x2 in the mid part of this wound. There is nothing that requires debridement under illumination this looks healthy. His abdomen  is unchanged 12/13; 1 month follow-up. Dimensions are smaller. We have been using polymen. Original surgical wound 03/02/2020; very superficial wounds larger. Everything here looks moist and a little more distention in his abdomen noted Electronic Signature(s) Signed: 03/05/2020 5:01:02 PM By: Linton Ham MD Entered By: Linton Ham on 03/02/2020 11:35:47 -------------------------------------------------------------------------------- Physical Exam Details Patient Name: Date of Service: Larry Bishop [name], [place] L. 03/02/2020 10:30 A M Medical Record Number: [id number] Patient Account Number: [id number] Date of Birth/Sex: Treating RN: Jul 11, 1950 (70 y.o. [name] Primary Care Provider: Lin Landsman Other Clinician: Referring Provider: Treating Provider/Extender: Nelida Meuse in Treatment: 60 Constitutional Sitting or standing Blood Pressure is within target range for patient.. Pulse regular and within target range for patient.[name] Kitchen Respirations regular, non-labored and within target range.. Temperature is normal and within the target range for the patient.[name] Kitchen Appears in no distress. Notes Wound exam; midline  abdominal scar much larger but superficial open areas. The entire surface looks a little more wet. Some abdominal distention but no tenderness. Electronic Signature(s) Signed: 03/05/2020 5:01:02 PM By: Linton Ham MD Entered By: Linton Ham on 03/02/2020 11:38:01 -------------------------------------------------------------------------------- Physician Orders Details Patient Name: Date of Service: Larry Bishop [name], BENFEA RD L. 03/02/2020 10:30 A M Medical Record Number: [id number] Patient Account Number: [id number] Date of Birth/Sex: Treating RN: 01/26/1951 (70 y.o. [name] Primary Care Provider: Lin Landsman Other Clinician: Referring Provider: Treating Provider/Extender: Nelida Meuse in Treatment: 17 Verbal / Phone  Orders: No Diagnosis Coding ICD-10 Coding Code Description T81.31XD Disruption of external operation (surgical) wound, not elsewhere classified, subsequent encounter S31.105S Unspecified open wound of abdominal wall, periumbilic region without penetration into peritoneal cavity, sequela Follow-up Appointments Return appointment in 1 month. Bathing/ Shower/ Hygiene May shower and wash wound with soap and water. - on days that dressing is changed Additional Orders / Instructions Other: - Montgomery abdominal binder while up during the day, may remove at night. Wound Treatment Wound #9 - Abdomen - midline Wound Laterality: Distal Cleanser: Soap and Water Every Other Day/30 Days Discharge Instructions: May shower and wash wound with dial antibacterial soap and water prior to dressing change. Prim Dressing: Maxorb Extra Calcium Alginate Dressing, 4x4 in (DME) (Generic) Every Other Day/30 Days ary Discharge Instructions: Apply calcium alginate to wound bed as instructed Secondary Dressing: Woven Gauze Sponge, Non-Sterile 4x4 in (DME) (Generic) Every Other Day/30 Days Discharge Instructions: Apply over primary dressing as directed. Secondary Dressing: ABD Pad, 5x9 (DME) Every Other Day/30 Days Discharge Instructions: Apply over primary dressing as directed. Secured With: 24M Medipore H Soft Cloth Surgical T 4 x 2 (in/yd) (DME) (Generic) Every Other Day/30 Days ape Discharge Instructions: Secure dressing with tape as directed. Electronic Signature(s) Signed: 03/02/2020 5:04:19 PM By: Levan Hurst RN, BSN Signed: 03/05/2020 5:01:02 PM By: Linton Ham MD Entered By: Levan Hurst on 03/02/2020 11:34:47 -------------------------------------------------------------------------------- Problem List Details Patient Name: Date of Service: Larry Bishop [name], [place] L. 03/02/2020 10:30 A M Medical Record Number: [id number] Patient Account Number: [id number] Date of Birth/Sex: Treating RN: 05/27/1950  (70 y.o. [name] Primary Care Provider: Lin Landsman Other Clinician: Referring Provider: Treating Provider/Extender: Nelida Meuse in Treatment: 60 Active Problems ICD-10 Encounter Code Description Active Date MDM Diagnosis T81.31XD Disruption of external operation (surgical) wound, not elsewhere classified, 01/04/2019 No Yes subsequent encounter S31.105S Unspecified open wound of abdominal wall, periumbilic region without [id number] No Yes penetration into peritoneal cavity, sequela Inactive Problems Resolved Problems Electronic Signature(s) Signed: 03/05/2020 5:01:02 PM By: Linton Ham MD Entered By: Linton Ham on 03/02/2020 11:33:23 -------------------------------------------------------------------------------- Progress Note Details Patient Name: Date of Service: Larry Bishop [name], BENFEA RD L. 03/02/2020 10:30 A M Medical Record Number: [id number] Patient Account Number: [id number] Date of Birth/Sex: Treating RN: 1950/06/30 (70 y.o. [name] Primary Care Provider: Lin Landsman Other Clinician: Referring Provider: Treating Provider/Extender: Nelida Meuse in Treatment: 60 Subjective History of Present Illness (HPI) The following HPI elements were documented for the patient's wound: Location: Patient presents with a wound to left lower leg. Quality: Patient reports No Pain. Severity: Patient states wound(s) are getting worse. Duration: Patient has had the wound for < 2 [name] prior to presenting for treatment Modifying Factors: pvd pt smoked until 5 years ago. cancer surgery 1 year ago. has ruq stoma. cv studies last year revealed non-compressible vessels. recent pe by pcp yielded some heart abnormality and he is scheduled  for stress test. 5/24 2 new wounds on rt leg. left side healed 08/12/14; several small wounds on the right lateral leg and one wound on the left leg which is new. The edema control here does  not look adequate. There is also some degree of maceration around the wounds on the right lateral leg READMISSION 09/14/2018 This is a patient we had in this clinic in 2013 and then again in 2016 with wounds on his right lateral leg secondary to chronic venous insufficiency. He was cared for I believe in 2016 by Dr. Lindon Romp. The patient is here for review of 6 small wounds within a large surgical area on his abdomen. The patient had surgery for a malignant neoplasm of the sigmoid colon with a colostomy. I cannot see the date of the exact surgery in epic. In February 2018 he had an attempted colostomy reversal however he had a microperforation I believe at the anastomosis. He required a repeat laparotomy. He had an ileostomy raised at that time. I believe the surgical wound at that time was left to close via secondary intention. The patient is unaware whether he had a wound VAC on this. In any case this almost is completely closed except he has been left with 6 small eraser head sized areas on the abdomen that have not healed. I am not exactly sure how he was how he is been dressing this. He has been followed with frequent visits in general surgery dating back at least over a year. Past medical history includes chronic systolic heart failure, stage III chronic renal failure. Malignant neoplasm of the sigmoid colon, morbid obesity. 7/31; not a lot of change 6 small wounds within a large surgical area on his abdomen. None of these are hyper granulated as opposed to last week. None of them have exposed mesh or surrounding infection. He has been using [facility] 8/14- He comes after 2 [name], he has only 2 open wounds that I can tell this time, he has been using Hydrofera Blue 8/28; 2-week follow-up. Very superficial areas on the abdominal surgical site scar. He is using Hydrofera Blue and this seems to be doing a good job. He has not made it down to the medical supply store to see if he can get an  abdominal binder or something to keep the tension off this wound area. READMISSION 01/04/2019 This is a 70 year old man we had in the clinic from July through August 2020. He had wounds in the midline abdominal scar. [Please see history from my note of 09/14/2018}. When he was here last time we used Hydrofera Blue. Things seem to be improving. He did not discharge in a healed state his last visit here was on 8/28. He states he continued to use the [facility] and at 1 point this was very close to healing but more recently it is reopened. The wounds are basically in the same state as last time superficial friable easily bleeding wounds. Some hyper granulation. He has an abdominal binder that he got at [facility] however he says it is too tight for him to get on. Past medical history is reviewed; he has coronary artery disease, stage III chronic renal failure heart failure with reduced ejection fraction, COPD, hypertension, obstructive sleep apnea. He was hospitalized since he was here last time for a severe pharyngitis. 11/19; the patient was in the ER on 11/16; this was for oozing from his abdominal wound. He was cauterized with quick clot gauze and epi and silver  nitrate. This was felt to be venous oozing. He has very friable wounds in this area. He is using his abdominal binder. 12/3; the patient was in the ER with bleeding from the lower part of his incision in the abdomen on 11/28. Had a stitch placed to contain the bleeding area. This was just underneath his major wound. He saw general surgery yesterday and apparently we are to remove the stitch in 2 [name]. The patient is on Plavix and aspirin for coronary artery disease with stents. His bleeding was felt to be capillary. 12/17; I removed the stitch from the bleeding area that was placed last time. This came out with some difficulty. The major area almost looks like slough with an area of cellophane over it. I did not attempt any further  debridement 02/28/2019. The patient has new areas superiorly and inferiorly to the major wound in the middle. The area in the middle has a nonviable surface over perhaps 60 to 70% however debridement causes uncontrolled bleeding here. He is on Plavix. We have been usin hydrogel and Prisma. 1/21; the patient has 2 superficial areas superiorly he has the major wound in the middle and a small area inferiorly in the middle of this abdominal wound. We switch to Iodoflex last time 2/4; the patient has 3 open areas. 2 of these are on the right part of this surgical wound on his abdomen and one more on the left central. The 2 on the right are hyper granulated I used silver nitrate on these. The area on the left appears to have a fibrinous surface. I attempted to debride this once he ended up in the ER. I am reluctant to do this again. We have been using Iodoflex 2/25; he has 3 open areas small ones proximally and distally and then the larger one in the middle. This has nonviable debris over fibrinous debris over the surface. He is using Iodoflex 3/30; I follow this man monthly. He has wounds in the middle of the surgical scar in his abdomen. T oday 2 wounds which is an improvement. The major wound in the middle and one inferiorly and medially. 4/26; I follow this patient every month. This is a palliative setting. Wounds in the middle part of his surgical scar in the abdomen. He had significant amounts of denuded skin around the central area. This wipes off with Anasept and gauze. Really not any major improvement. We did get an abdominal binder for him at 1 point as usual patients do not wear these 07/16/19-Patient is back at 1 month, he has 3 small wounds in the center of his surgical scar on his abdomen, these appear to be slightly larger. He is not using the abdominal binder as it is very uncomfortable 6/25; monthly follow-up. He is down to 2 open areas on the abdomen which appeared somewhat better today the  larger area is superiorly and a small area inferiorly. There is debris on the surface of the superior 1 under illumination however very friable tissue. As opposed to what I said a month ago he is still using Iodoflex with some improvement in surface areas 7/23. Monthly follow-up. He has a central area that is still open and this abdominal scar. Not able to get on his abdominal binder that we got some time ago. He is simply growing out of it. One of her nurses suggested Montgomery straps he is going to try those. 9/2; monthly follow-up. He is using Montgomery straps. Actually his wound area looks as  good as I have seen it. Still some dry flaking skin that does not appear to be robust but a lot of this is closed. Still using Iodoflex on the wound surface 10/7; we have made nice improvements in the wound dimensions and the surface of this no longer has the adherent fibrinous debris he once had. I am reluctant to do mechanical debridement here because of the bleeding. He also has the Montgomery straps which have been very helpful I think in taking some of the torsion stress off the surface of this wound 11/15; superficial wounds x2 in the mid part of this wound. There is nothing that requires debridement under illumination this looks healthy. His abdomen is unchanged 12/13; 1 month follow-up. Dimensions are smaller. We have been using polymen. Original surgical wound 03/02/2020; very superficial wounds larger. Everything here looks moist and a little more distention in his abdomen noted Objective Constitutional Sitting or standing Blood Pressure is within target range for patient.. Pulse regular and within target range for patient.[name] Kitchen Respirations regular, non-labored and within target range.. Temperature is normal and within the target range for the patient.[name] Kitchen Appears in no distress. Vitals Time Taken: 10:58 AM, Height: 71 in, Weight: 344 lbs, BMI: 48, Temperature: 98.3 F, Pulse: 104 bpm, Respiratory  Rate: 18 breaths/min, Blood Pressure: 104/66 mmHg. General Notes: Wound exam; midline abdominal scar much larger but superficial open areas. The entire surface looks a little more wet. Some abdominal distention but no tenderness. Integumentary (Hair, Skin) Wound #9 status is Open. Original cause of wound was Gradually Appeared. The wound is located on the Distal Abdomen - midline. The wound measures 6cm length x 3cm width x 0.1cm depth; 14.137cm^2 area and 1.414cm^3 volume. There is Fat Layer (Subcutaneous Tissue) exposed. There is no tunneling or undermining noted. There is a medium amount of serosanguineous drainage noted. The wound margin is flat and intact. There is large (67-100%) red, pink granulation within the wound bed. There is no necrotic tissue within the wound bed. Assessment Active Problems ICD-10 Disruption of external operation (surgical) wound, not elsewhere classified, subsequent encounter Unspecified open wound of abdominal wall, periumbilic region without penetration into peritoneal cavity, sequela Procedures Wound #9 Pre-procedure diagnosis of Wound #9 is an Open Surgical Wound located on the Distal Abdomen - midline . There was a Chemical/Enzymatic/Mechanical debridement performed by Levan Hurst, RN.. Other agent used was Anasept and gauze to remove biofilm. A time out was conducted at 11:32, prior to the start of the procedure. A Minimum amount of bleeding was controlled with Pressure. The procedure was tolerated well with a pain level of 0 throughout and a pain level of 0 following the procedure. Post Debridement Measurements: 6cm length x 3cm width x 0.1cm depth; 1.414cm^3 volume. Character of Wound/Ulcer Post Debridement is improved. Post procedure Diagnosis Wound #9: Same as Pre-Procedure Plan Follow-up Appointments: Return appointment in 1 month. Bathing/ Shower/ Hygiene: May shower and wash wound with soap and water. - on days that dressing is  changed Additional Orders / Instructions: Other: - [place] abdominal binder while up during the day, may remove at night. WOUND #9: - Abdomen - midline Wound Laterality: Distal Cleanser: Soap and Water Every Other Day/30 Days Discharge Instructions: May shower and wash wound with dial antibacterial soap and water prior to dressing change. Prim Dressing: Maxorb Extra Calcium Alginate Dressing, 4x4 in (DME) (Generic) Every Other Day/30 Days ary Discharge Instructions: Apply calcium alginate to wound bed as instructed Secondary Dressing: Woven Gauze Sponge, Non-Sterile 4x4 in (DME) (  Generic) Every Other Day/30 Days Discharge Instructions: Apply over primary dressing as directed. Secondary Dressing: ABD Pad, 5x9 (DME) Every Other Day/30 Days Discharge Instructions: Apply over primary dressing as directed. Secured With: 32M Medipore H Soft Cloth Surgical T 4 x 2 (in/yd) (DME) (Generic) Every Other Day/30 Days ape Discharge Instructions: Secure dressing with tape as directed. 1. I change his primary dressing to calcium alginate because of the moisture 2. Abdomen slightly more distended but no acute findings 3. Follow-up in 1 month. Wounds like this are exceptionally difficult to get to close over. 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 03/02/2020 Medical Record Number: [id number] Patient Account Number: [id number] Date of Birth/Sex: Treating RN: [birth year] (70 y.o. [name] Primary Care Provider: Lin Landsman Other Clinician: Referring Provider: Treating Provider/Extender: Nelida Meuse in Treatment: 60 Diagnosis Coding ICD-10 Codes Code Description T81.31XD Disruption of  external operation (surgical) wound, not elsewhere classified, subsequent encounter S31.105S Unspecified open wound of abdominal wall, periumbilic region without penetration into peritoneal cavity, sequela Facility Procedures The patient participates with Medicare or their insurance follows the Medicare Facility Guidelines: CPT4 Code Description Modifier Quantity [id number] - DEBRIDE W/O ANES NON [facility] Physician Procedures : CPT4 Code Description Modifier [id number] - WC PHYS LEVEL 3 - EST PT ICD-10 Diagnosis Description T81.31XD Disruption of external operation (surgical) wound, not elsewhere classified, subsequent encounter S31.105S Unspecified open wound of abdominal  wall, periumbilic region without penetration into peritoneal cavi Quantity: 1 ty, sequela Electronic Signature(s) Signed: 03/05/2020 5:01:02 PM By: Linton Ham MD Entered By: Linton Ham on 03/02/2020 11:39:15

## 2020-03-20 ENCOUNTER — Ambulatory Visit
Admission: RE | Admit: 2020-03-20 | Discharge: 2020-03-20 | Disposition: A | Discharge status: Home / Self Care | Payer: Medicare Other | Source: Ambulatory Visit | Attending: Nephrology | Admitting: Nephrology

## 2020-03-20 ENCOUNTER — Other Ambulatory Visit: Payer: Self-pay

## 2020-03-20 DIAGNOSIS — N281 Cyst of kidney, acquired: Secondary | ICD-10-CM

## 2020-03-20 MED ORDER — GADOBENATE DIMEGLUMINE 529 MG/ML IV SOLN
20.0000 mL | Freq: Once | INTRAVENOUS | Status: AC | PRN
  Administered 2020-03-20: 20 mL via INTRAVENOUS

## 2020-03-30 ENCOUNTER — Encounter (HOSPITAL_BASED_OUTPATIENT_CLINIC_OR_DEPARTMENT_OTHER): Payer: Federal, State, Local not specified - PPO | Admitting: Internal Medicine

## 2020-04-13 ENCOUNTER — Encounter (HOSPITAL_BASED_OUTPATIENT_CLINIC_OR_DEPARTMENT_OTHER): Payer: Medicare Other | Admitting: Internal Medicine

## 2020-04-26 NOTE — Progress Notes (Signed)
Cardiology Office Note Date:  04/26/2020  Patient ID:  Larry [name], Larry [name] 1951-01-09, MRN [id number] PCP:  Lin Landsman, MD  Cardiologist:/Electrophysiologist: L. Servando Snare, NP/ Dr. Rayann Heman Nephrologist: he can not recall name, is in [facility]   Chief Complaint:  planned visit  History of Present Illness: Larry [name] is a 70 y.o. male with history of CAD (PCI RCA 2010, LAD 2016), ICM w/recovered LVEF, chronic CHF (combined, morbid obesity, chronic LE edema (described as massive), ATach, OSA w/CPAP, colon ca (surgery complicated by prolonged QT and torsades in setting of profound electrolyte abnormality). HTN, HLD, CKD (III-IV) 2018 attempt at ileostomy reversal complicated with anastomosis leak, respiratory failure and prolonged stay at [facility].  His LE is described as chronic and massive, CCB continued given his ATach, in the past with recurrence of tachycardia when attepts to reduce dose.  Described as basically home bound it seems 2/2 weight, edema, follows at wound clinic.  He comes in today to be seen for Dr. Rayann Heman, last seen by him back in 2019, following routinely with Tommas Olp, NP who has since retired. She last saw him Nov 2021, he was doing generally OK from a cardiac standpoint.  TODAY He is doing well. I observed him walking in, looked sturdy and ambulating without difficulty. He tells me he stays home to stay away from [place] mostly Gets out to shop etc. He does not exercise, but denies any symptoms with his ADLs, no CP, palpitations.  Denies SOB or DOE< denies symptoms of orthopnea or PND. No dizzy spells, near syncope or syncope.  Follows with his PMD and nephrology Follows with wound clinic for abdominal wound that he has been dealing with nor fo years, though is stable and doing well.   Past Medical History:  Diagnosis Date  . Arthritis   . Atrial tachycardia ([place])    managed on beta blocker therapy  . Childhood asthma    "went away after I  was 16"  . Chronic combined systolic and diastolic CHF, NYHA class 3 (HCC)    has diastolic heart failure grade 1; EF is 45 to 50% per echo 05/2011; EF 41% by Myoview 2016  . CKD (chronic kidney disease) stage 3, GFR 30-59 ml/min (HCC)   . Colon cancer ([place]) 2015   MSI high; IHC loss of MLH1 and PMS2; BRAF negative; Negative methylation  . COPD (chronic obstructive pulmonary disease) ([place])   . Coronary artery disease   . Family history of ovarian cancer   . Hypercholesteremia   . Hypertension   . Noncompliance   . NSVT (nonsustained ventricular tachycardia) (HCC)    beta blocker restarted  . Obesity   . OSA on CPAP    used nightly, pt does not know settings  . Peripheral arterial disease (HCC)    4.2 cm thoracic aortic aneurysm per chest ct 11-14-15 epic  . S/P colostomy ([place])    2014  . Thrombocytopenia ([place])   . Torsades de pointes ([place])    X 2 episodes during hospital visit 12'14"electrolyte imbalance"- "Shocked"    Past Surgical History:  Procedure Laterality Date  . COLON SURGERY    . COLONOSCOPY N/A 02/08/2013   Procedure: COLONOSCOPY;  Surgeon: [name], MD;  Location: [facility];  Service: Endoscopy;  Laterality: N/A;  . COLONOSCOPY WITH PROPOFOL N/A 05/09/2014   Procedure: COLONOSCOPY WITH PROPOFOL;  Surgeon: [name], MD;  Location: [facility];  Service: Endoscopy;  Laterality: N/A;  . COLONOSCOPY WITH PROPOFOL N/A 01/08/2016  Procedure: COLONOSCOPY WITH PROPOFOL;  Surgeon: [name], MD;  [facility];  Service: Endoscopy;  Laterality: N/A;  . COLONOSCOPY WITH PROPOFOL N/A 12/28/2018   Procedure: COLONOSCOPY WITH PROPOFOL;  Surgeon: [name], MD;  [facility];  Service: Endoscopy;  Laterality: N/A;  . COLOSTOMY N/A 04/19/2016   Procedure: COLOSTOMY;  Surgeon: Judeth Horn, MD;  Location: [facility];  Service: General;  Laterality: N/A;  . COLOSTOMY REVERSAL  04/12/2016  . COLOSTOMY REVERSAL N/A 04/12/2016   Procedure: COLOSTOMY  REVERSAL;  Surgeon: Judeth Horn, MD;  Location: [facility];  Service: General;  Laterality: N/A;  . CORONARY ANGIOPLASTY WITH STENT PLACEMENT  11/12/2008; 06/11/2014   stent x 2 to RCA; stent x 2 to LAD  . CORONARY ANGIOPLASTY WITH STENT PLACEMENT  06/11/2014   m-LAD 3.5 x 16 mm Synergy DES, d-LAD  2.25 x 16 mm Synergy DES  . CYSTOSCOPY W/ URETERAL STENT PLACEMENT Left 06/30/2017   Procedure: CYSTOSCOPY WITH RETROGRADE PYELOGRAM/URETERAL STENT PLACEMENT;  Surgeon: Lucas Mallow, MD;  Location: [facility];  Service: Urology;  Laterality: Left;  . CYSTOSCOPY WITH LITHOLAPAXY N/A 08/02/2017   Procedure: CYSTOSCOPY WITH BLADDER STONE EXTRACTION;  Surgeon: Lucas Mallow, MD;  [facility];  Service: Urology;  Laterality: N/A;  . CYSTOSCOPY WITH RETROGRADE PYELOGRAM, URETEROSCOPY AND STENT PLACEMENT Left 08/02/2017   Procedure: CYSTOSCOPY WITH LEFT RETROGRADE PYELOGRAM, URETEROSCOPY AND STENT EXCHANGE;  Surgeon: Lucas Mallow, MD;  [facility];  Service: Urology;  Laterality: Left;  . ESOPHAGOGASTRODUODENOSCOPY N/A 02/08/2013   Procedure: ESOPHAGOGASTRODUODENOSCOPY (EGD);  Surgeon: [name], MD;  [facility];  Service: Endoscopy;  Laterality: N/A;  . FLEXIBLE SIGMOIDOSCOPY N/A 02/19/2016   Procedure: FLEXIBLE SIGMOIDOSCOPY;  Surgeon: [name], MD;  [facility];  Service: Endoscopy;  Laterality: N/A;  . HOLMIUM LASER APPLICATION Left 5/53/7482   Procedure: HOLMIUM LASER APPLICATION;  Surgeon: Lucas Mallow, MD;  [facility];  Service: Urology;  Laterality: Left;  . LAPAROTOMY N/A 02/12/2013   Procedure: EXPLORATORY LAPAROTOMY PARTIAL COLECTOMY WITH COLOSTOMY;  Surgeon: [name], MD;  Location: [facility];  Service: General;  Laterality: N/A;  . LAPAROTOMY N/A 02/18/2013   Procedure: EXPLORATORY LAPAROTOMY/Closure of Wound;  Surgeon: [name], MD;  Location: [facility];  Service: General;  Laterality: N/A;  . LAPAROTOMY N/A 04/19/2016   Procedure:  EXPLORATORY LAPAROTOMY, REPAIR OF ANASTAMOTIC LEAK;  Surgeon: Judeth Horn, MD;  Location: [facility];  Service: General;  Laterality: N/A;  . LEFT HEART CATHETERIZATION WITH CORONARY ANGIOGRAM N/A 06/11/2014   Procedure: LEFT HEART CATHETERIZATION WITH CORONARY ANGIOGRAM;  Surgeon: [name], MD; CFX calcified, 30-40 percent, RCA calcified, 40/50/40%, PDA diffuse disease, LAD 40/75/90% s/p DES 2   . POLYPECTOMY  12/28/2018   Procedure: POLYPECTOMY;  Surgeon: [name], MD;  [facility];  Service: Endoscopy;;    Current Outpatient Medications  Medication Sig Dispense Refill  . aspirin EC 81 MG tablet Take 1 tablet (81 mg total) by mouth daily. 30 tablet 0  . atorvastatin (LIPITOR) 40 MG tablet TAKE 1 TABLET BY MOUTH EVERY DAY 90 tablet 2  . clopidogrel (PLAVIX) 75 MG tablet TAKE 1 TABLET (75 MG TOTAL) BY MOUTH DAILY WITH BREAKFAST. 90 tablet 2  . diltiazem (CARDIZEM) 60 MG tablet TAKE 1 & 1/2 TABLET BY MOUTH 3 TIMES DAILY. 405 tablet 2  . ferrous sulfate 325 (65 FE) MG tablet Take 1 tablet (325 mg total) by mouth daily with breakfast. 30 tablet  1  . furosemide (LASIX) 40 MG tablet TAKE 1.5 TABLETS (60 MG TOTAL) BY MOUTH DAILY. 135 tablet 2  . levalbuterol (XOPENEX HFA) 45 MCG/ACT inhaler INHALE 2 PUFFS BY MOUTH EVERY 4 HOURS AS NEEDED FOR WHEEZING 15 Inhaler 1  . metoprolol tartrate (LOPRESSOR) 25 MG tablet TAKE 1.5 TABLETS (37.5 MG TOTAL) BY MOUTH 2 (TWO) TIMES DAILY. 270 tablet 1  . Multiple Vitamin (MULTI-VITAMIN DAILY) TABS Take by mouth.    . nitroGLYCERIN (NITROSTAT) 0.4 MG SL tablet Place 0.4 mg under the tongue every 5 (five) minutes as needed for chest pain.    . simethicone (GAS-X EXTRA STRENGTH) 125 MG chewable tablet Chew 125 mg by mouth 3 (three) times daily.    [name] Kitchen spironolactone (ALDACTONE) 25 MG tablet TAKE 1/2 TABLET BY MOUTH EVERY DAY 45 tablet 4  . tamsulosin (FLOMAX) 0.4 MG CAPS capsule TAKE 1 CAPSULE BY MOUTH EVERY DAY 90 capsule 3   No current  facility-administered medications for this visit.    Allergies:   Patient has no known allergies.   Social History:  The patient  reports that he quit smoking about 11 years ago. His smoking use included cigarettes. He has a 38.00 pack-year smoking history. He has never used smokeless tobacco. He reports current alcohol use. He reports that he does not use drugs.   Family History:  The patient's family history includes Dementia in his mother; Diabetes in his father; Heart disease in his father; Hypertension in his father and mother; Leukemia in his maternal uncle; Liver cancer in his maternal grandmother; Parkinsonism in his mother; Prostate cancer in his maternal uncle.  ROS:  Please see the history of present illness.    All other systems are reviewed and otherwise negative.   PHYSICAL EXAM:  VS:  There were no vitals taken for this visit. BMI: There is no height or weight on file to calculate BMI. Well nourished, well developed, in no acute distress HEENT: normocephalic, atraumatic Neck: no JVD, carotid bruits or masses Cardiac:  RRR; no significant murmurs, no rubs, or gallops Lungs:  CTA b/l, no wheezing, rhonchi or rales Abd: soft, nontender, ileostomy, no noted open wounds MS: no deformity or atrophy Ext: marked edema, chronic looking and very dry skin changes, he reports his edema at his baseline without change for years Skin: warm and dry, no rash Neuro:  No gross deficits appreciated Psych: euthymic mood, full affect    EKG:  Done today and reviewed by myself shows  01/01/20 EKG is reviewed, SRT 106bpm, 1st degree AVblock, PR 261m, IVCD QRS 1143m   EchoStudy Conclusions2/2018 - Left ventricle: The cavity size was normal. Wall thickness was increased in a pattern of moderate LVH. Systolic function was normal. The estimated ejection fraction was in the range of 55% to 60%. Wall motion was normal; there were no regional wall motion abnormalities. The study is  not technically sufficient to allow evaluation of LV diastolic function. - Aortic root: The aortic root was mildly dilated. - Mitral valve: Calcified annulus.  Impressions:  - Technically difficult; definity used; normal LV function; moderate LVH.   MyoviewStudy Highlights12/2017  Nuclear stress EF: 58%.  There was no ST segment deviation noted during stress.  There is a large defect of severe severity present in the basal inferior, mid inferior, apical septal, apical inferior and apex location. The defect is non-reversible. No ischemia noted. Tthis is consistent with diaphragmatic attenuation artifact vs. Scar.  This is a low risk study.  The left  ventricular ejection fraction is normal (55-65%).     Procedure: Left Heart Cath, [facility] Coron[facility] Angiography, PTCA and stenting of the mid and distal LAD April 2016.  Coronary angiography: Left mainstem: The left mainstem is patent. The vessel divides into the LAD and left circumflex. There is no significant stenosis, but there is mild irregularity.  Left anterior descending (LAD): The LAD is moderately calcified. The vessel is severely diseased. The proximal LAD is patent with 30-40% stenosis after the second septal perforator. There is diffuse calcification. The diagonal branches are small. The mid LAD has an eccentric 75% stenosis at the origin of the second diagonal branch. Beyond that area the mid and distal LAD are diffusely diseased. There is another severe stenosis in the apical LAD of 90%.  Left circumflex (LCx): The left circumflex is calcified. The vessel is patent with mild irregularity. There are no high-grade stenoses identified. There are scattered 30-40% stenoses throughout the proximal and mid circumflex as well as the first OM branch.  Right coronary artery (RCA): The RCA is dominant. The vessel is heavily calcified. The stented segments in the mid and distal RCA are patent. The proximal  vessel has 30-40% stenosis. The mid vessel has 30-40% stenosis. The distal vessel is patent with 50-60% stenosis involving the origin of the PDA. The PDA is diffusely diseased.  Left ventriculography: Deferred because of chronic kidney disease. By nuclear scan the LVEF is 41%.  PCI Note: Following the diagnostic procedure, the decision was made to proceed with PCI of the mid and apical LAD. The right coronary artery had patent stents in the left circumflex had nonobstructive disease. The LAD is a diffusely diseased vessel and the mid and distal vessel would be a poor surgical targets. I felt that PCI was the best option in this patient with a high risk nuclear scan demonstrating anteroapical and anterolateral ischemia. He will require colostomy reversal, so I planned on treating him with Synergy drug-eluting stents which had a biodegradable polymer and potentially can allow for earlier interruption of dual antiplatelet therapy. The patient was loaded with Plavix 600 mg. Weight-based bivalirudin was given for anticoagulation. Once a therapeutic ACT was achieved, a 6 [place] XB LAD 3.5 cm guide catheter was inserted. A cougar coronary guidewire was used to cross the lesion in the apical LAD. The lesion was predilated with a 2.5 x 12 mm balloon. The same balloon was used to dilate the mid lesion. The apical lesion was then stented with a 2.25 x 16 mm Synergy DES. The stent was postdilated with a 2.5 mm noncompliant balloon. The mid lesion was then stented with a 3.5 x 16 mm Synergy DES. That stent was postdilated with a 3.75 mm noncompliant balloon. Following PCI, there was 0% residual stenosis and TIMI-3 flow. Final angiography confirmed an excellent result. The patient tolerated the procedure well. There were no immediate procedural complications. A TR band was used for radial hemostasis. The patient was transferred to the post catheterization recovery area for further monitoring.  PCI Data: Lesion  1: Vessel - LAD/Segment - distal/apical Percent Stenosis (pre) 90 TIMI-flow 3 Stent 2.25 x 16 mm Synergy DES Percent Stenosis (post) 0 TIMI-flow (post) 3  Lesion 2: Vessel - LAD/Segment - mid Percent Stenosis (pre) 75 TIMI-flow 3 Stent 3.5 x 16 mm Synergy DES Percent Stenosis (post) 0 TIMI-flow (post) 3  Estimated Blood Loss: Minimal  Final Conclusions:  1. Two-vessel coronary artery disease with continued patency of the stented segments in the right coronary artery  and severe stenoses of the mid and apical LAD 2. Mild diffuse nonobstructive left circumflex stenosis 3. Successful PCI of the LAD using 2 drug-eluting stents   Recommendations:  Dual antiplatelet therapy for at least 6 months. Would be reasonable to consider interruption of aspirin and Plavix at 6 months for colostomy reversal.    Recent Labs: 01/01/2020: ALT 21; BUN 22; Creatinine, Ser 2.40; Hemoglobin 12.8; Platelets 133; Potassium 4.1; Sodium 139  01/01/2020: Chol/HDL Ratio 2.8; Cholesterol, Total 161; HDL 58; LDL Chol Calc (NIH) 76; Triglycerides 158   CrCl cannot be calculated (Patient's most recent lab result is older than the maximum 21 days allowed.).   Wt Readings from Last 3 Encounters:  01/01/20 (!) 362 lb 3.2 oz (164.3 kg)  09/19/19 (!) 362 lb 9.6 oz (164.5 kg)  09/11/19 (!) 363 lb (164.7 kg)     Other studies reviewed: Additional studies/records reviewed today include: summarized above  ASSESSMENT AND PLAN:  1. Atach     Not an ablation candidate     No palpitations     Continue dilt  2. CAD     no symptoms of angina     On ASA, plavix, BB, statin  3. HTN     Looks good  4. HLD     Will check at his next visit, will come fasting  5. Morbid obesity 6. Chronic marked edema     Follows PMD and nephrology     Reports at his baseline unchanged for years     No changes, defer diuretics to his PMD  Disposition: F/u with [place] Q 31mo sooner if needed.  Current medicines are  reviewed at length with the patient today.  The patient did not have any concerns regarding medicines.  SVenetia Night PA-C 04/26/2020 12:41 PM     [place] [phone])  ([phone](fax)

## 2020-04-28 ENCOUNTER — Ambulatory Visit (INDEPENDENT_AMBULATORY_CARE_PROVIDER_SITE_OTHER): Payer: Medicare Other | Admitting: Physician Assistant

## 2020-04-28 ENCOUNTER — Encounter: Payer: Self-pay | Admitting: Physician Assistant

## 2020-04-28 ENCOUNTER — Other Ambulatory Visit: Payer: Self-pay

## 2020-04-28 VITALS — BP 114/62 | HR 91 | Ht 71.0 in | Wt 361.0 lb

## 2020-04-28 DIAGNOSIS — I10 Essential (primary) hypertension: Secondary | ICD-10-CM | POA: Diagnosis not present

## 2020-04-28 DIAGNOSIS — E785 Hyperlipidemia, unspecified: Secondary | ICD-10-CM | POA: Diagnosis not present

## 2020-04-28 DIAGNOSIS — I471 Supraventricular tachycardia: Secondary | ICD-10-CM | POA: Diagnosis not present

## 2020-04-28 DIAGNOSIS — I2510 Atherosclerotic heart disease of native coronary artery without angina pectoris: Secondary | ICD-10-CM

## 2020-04-28 DIAGNOSIS — I4719 Other supraventricular tachycardia: Secondary | ICD-10-CM

## 2020-04-28 NOTE — Patient Instructions (Signed)
Medication Instructions:   Your physician recommends that you continue on your current medications as directed. Please refer to the Current Medication list given to you today.  *If you need a refill on your cardiac medications before your next appointment, please call your pharmacy*   Lab Work:   If you have labs (blood work) drawn today and your tests are completely normal, you will receive your results only by: [name] Kitchen MyChart Message (if you have MyChart) OR . A paper copy in the mail If you have any lab test that is abnormal or we need to change your treatment, we will call you to review the results.   Testing/Procedures: NONE ORDERED  TODAY   Follow-Up: At [facility], you and your health needs are our priority.  As part of our continuing mission to provide you with exceptional heart care, we have created designated Provider Care Teams.  These Care Teams include your primary Cardiologist (physician) and Advanced Practice Providers (APPs -  Physician Assistants and Nurse Practitioners) who all work together to provide you with the care you need, when you need it.  We recommend signing up for the patient portal called "MyChart".  Sign up information is provided on this After Visit Summary.  MyChart is used to connect with patients for Virtual Visits (Telemedicine).  Patients are able to view lab/test results, encounter notes, upcoming appointments, etc.  Non-urgent messages can be sent to your provider as well.   To learn more about what you can do with MyChart, go to [email or web address].    Your next appointment:    Return fasting on follow day  6 month(s)  The format for your next appointment:   In Person  Provider:   Tommye Standard, PA-C   Other Instructions

## 2020-06-01 ENCOUNTER — Other Ambulatory Visit: Payer: Self-pay

## 2020-06-01 DIAGNOSIS — I471 Supraventricular tachycardia: Secondary | ICD-10-CM

## 2020-06-01 DIAGNOSIS — I5022 Chronic systolic (congestive) heart failure: Secondary | ICD-10-CM

## 2020-06-01 MED ORDER — SPIRONOLACTONE 25 MG PO TABS: 0.5000 | ORAL_TABLET | Freq: Every day | ORAL | 3 refills | Status: DC

## 2020-06-19 ENCOUNTER — Other Ambulatory Visit: Payer: Self-pay

## 2020-06-19 MED ORDER — ATORVASTATIN CALCIUM 40 MG PO TABS: 1.0000 | ORAL_TABLET | Freq: Every day | ORAL | 3 refills | Status: DC

## 2020-06-19 MED ORDER — CLOPIDOGREL BISULFATE 75 MG PO TABS
1.0000 | ORAL_TABLET | Freq: Every day | ORAL | 3 refills | Status: DC
Start: 2020-06-19 — End: 2021-02-25

## 2020-06-19 MED ORDER — FUROSEMIDE 40 MG PO TABS: 60.0000 mg | ORAL_TABLET | Freq: Every day | ORAL | 3 refills | Status: DC

## 2020-06-19 MED ORDER — METOPROLOL TARTRATE 25 MG PO TABS: 37.5000 mg | ORAL_TABLET | Freq: Two times a day (BID) | ORAL | 3 refills | Status: DC

## 2020-07-27 ENCOUNTER — Other Ambulatory Visit: Payer: Self-pay

## 2020-07-27 DIAGNOSIS — I5022 Chronic systolic (congestive) heart failure: Secondary | ICD-10-CM

## 2020-07-27 DIAGNOSIS — I471 Supraventricular tachycardia: Secondary | ICD-10-CM

## 2020-07-27 DIAGNOSIS — E7800 Pure hypercholesterolemia, unspecified: Secondary | ICD-10-CM

## 2020-07-27 DIAGNOSIS — I259 Chronic ischemic heart disease, unspecified: Secondary | ICD-10-CM

## 2020-07-27 DIAGNOSIS — R0602 Shortness of breath: Secondary | ICD-10-CM

## 2020-07-27 MED ORDER — DILTIAZEM HCL 60 MG PO TABS: ORAL_TABLET | ORAL | 2 refills | Status: DC

## 2020-07-27 NOTE — Telephone Encounter (Signed)
Pt's pharmacy is requesting a refill on levalbuterol, inhaler, would Dr. Rayann Heman like to refill this mediation? Please address

## 2020-07-28 MED ORDER — LEVALBUTEROL TARTRATE 45 MCG/ACT IN AERO: INHALATION_SPRAY | RESPIRATORY_TRACT | 1 refills | Status: DC

## 2020-09-14 ENCOUNTER — Other Ambulatory Visit: Payer: Self-pay | Admitting: *Deleted

## 2020-09-14 MED ORDER — TAMSULOSIN HCL 0.4 MG PO CAPS: 0.4000 mg | ORAL_CAPSULE | Freq: Every day | ORAL | 3 refills | Status: DC

## 2020-11-18 NOTE — Progress Notes (Signed)
Cardiology Office Note Date:  11/18/2020  Patient ID:  Larry [name] August 01, 1950, MRN [id number] PCP:  Lin Landsman, MD  Cardiologist:/Electrophysiologist: L. Servando Snare, NP/ Dr. Rayann Heman Nephrologist: he can not recall name, is in [facility]   Chief Complaint:  scheduled follow up, 6 mo  History of Present Illness: Larry [name] is a 70 y.o. male with history of CAD (PCI RCA 2010, LAD 2016), ICM w/recovered LVEF, chronic CHF (combined, morbid obesity, chronic LE edema (described as massive), ATach, OSA w/CPAP, colon ca (surgery complicated by prolonged QT and torsades in setting of profound electrolyte abnormality). HTN, HLD, CKD (III-IV) 2018 attempt at ileostomy reversal complicated with anastomosis leak, respiratory failure and prolonged stay at [facility].  His LE is described as chronic and massive, CCB continued given his ATach, in the past with recurrence of tachycardia when attepts to reduce dose.  Described as basically home bound it seems 2/2 weight, edema, follows at wound clinic.  He comes in today to be seen for Dr. Rayann Heman, last seen by him back in 2019, following routinely with Tommas Olp, NP who has since retired. She last saw him Nov 2021, he was doing generally OK from a cardiac standpoint.  I saw him 04/28/20 He is doing well. I observed him walking in, looked sturdy and ambulating without difficulty. He tells me he stays home to stay away from [place] mostly Gets out to shop etc. He does not exercise, but denies any symptoms with his ADLs, no CP, palpitations.  Denies SOB or DOE< denies symptoms of orthopnea or PND. No dizzy spells, near syncope or syncope.  Follows with his PMD and nephrology Follows with wound clinic for abdominal wound that he has been dealing with now for years, though is stable and doing well. Marked edema described as unchanged for years and diuretics managed with PMD, nephrology  From a CAD/ATach perspective was doing wll, no  changes were made, planned for 8movisit with labs  TODAY Again, cardiac-wise her feels like he is doing very well. No CP, palpitations or cardiac awareness. No dizzy spells, near syncope or syncope He is largely very sedentary, stays in, only getting out for groceries and doxtor visits No rest SOB, no PND, orthopnea, uses CPAP nightly he does get winded with ambulation of longer distances, unchanged for years. No difficulties with his ADLs   Past Medical History:  Diagnosis Date   Arthritis    Atrial tachycardia ([facility]    managed on beta blocker therapy   Childhood asthma    "went away after I was 16"   Chronic combined systolic and diastolic CHF, NYHA class 3 (HCC)    has diastolic heart failure grade 1; EF is 45 to 50% per echo 05/2011; EF 41% by Myoview 2016   CKD (chronic kidney disease) stage 3, GFR 30-59 ml/min (HCC)    Colon cancer ([facility]   MSI high; IHC loss of MLH1 and PMS2; BRAF negative; Negative methylation   COPD (chronic obstructive pulmonary disease) (HCC)    Coronary artery disease    Family history of ovarian cancer    Hypercholesteremia    Hypertension    Noncompliance    NSVT (nonsustained ventricular tachycardia) (HCC)    beta blocker restarted   Obesity    OSA on CPAP    used nightly, pt does not know settings   Peripheral arterial disease ([facility]    4.2 cm thoracic aortic aneurysm per chest ct 11-14-15 epic   S/P colostomy ([facility]  2014   Thrombocytopenia ([place])    Torsades de pointes ([place])    X 2 episodes during hospital visit 12'14"electrolyte imbalance"- "Shocked"    Past Surgical History:  Procedure Laterality Date   COLON SURGERY     COLONOSCOPY N/A 02/08/2013   Procedure: COLONOSCOPY;  Surgeon: [name], MD;  Location: [place];  Service: Endoscopy;  Laterality: N/A;   COLONOSCOPY WITH PROPOFOL N/A 05/09/2014   Procedure: COLONOSCOPY WITH PROPOFOL;  Surgeon: [name], MD;  Location: [facility];  Service: Endoscopy;   Laterality: N/A;   COLONOSCOPY WITH PROPOFOL N/A 01/08/2016   Procedure: COLONOSCOPY WITH PROPOFOL;  Surgeon: [name], MD;  Location: [facility];  Service: Endoscopy;  Laterality: N/A;   COLONOSCOPY WITH PROPOFOL N/A 12/28/2018   Procedure: COLONOSCOPY WITH PROPOFOL;  Surgeon: [name], MD;  Location: [facility];  Service: Endoscopy;  Laterality: N/A;   COLOSTOMY N/A 04/19/2016   Procedure: COLOSTOMY;  Surgeon: Judeth Horn, MD;  Location: [facility];  Service: General;  Laterality: N/A;   COLOSTOMY REVERSAL  04/12/2016   COLOSTOMY REVERSAL N/A 04/12/2016   Procedure: COLOSTOMY REVERSAL;  Surgeon: Judeth Horn, MD;  Location: [place];  Service: General;  Laterality: N/A;   CORONARY ANGIOPLASTY WITH STENT PLACEMENT  11/12/2008; 06/11/2014   stent x 2 to RCA; stent x 2 to LAD   CORONARY ANGIOPLASTY WITH STENT PLACEMENT  06/11/2014   m-LAD 3.5 x 16 mm Synergy DES, d-LAD  2.25 x 16 mm Synergy DES   CYSTOSCOPY W/ URETERAL STENT PLACEMENT Left 06/30/2017   Procedure: CYSTOSCOPY WITH RETROGRADE PYELOGRAM/URETERAL STENT PLACEMENT;  Surgeon: Lucas Mallow, MD;  Location: [place];  Service: Urology;  Laterality: Left;   CYSTOSCOPY WITH LITHOLAPAXY N/A 08/02/2017   Procedure: CYSTOSCOPY WITH BLADDER STONE EXTRACTION;  Surgeon: Lucas Mallow, MD;  [facility];  Service: Urology;  Laterality: N/A;   CYSTOSCOPY WITH RETROGRADE PYELOGRAM, URETEROSCOPY AND STENT PLACEMENT Left 08/02/2017   Procedure: CYSTOSCOPY WITH LEFT RETROGRADE PYELOGRAM, URETEROSCOPY AND STENT EXCHANGE;  Surgeon: Lucas Mallow, MD;  [facility];  Service: Urology;  Laterality: Left;   ESOPHAGOGASTRODUODENOSCOPY N/A 02/08/2013   Procedure: ESOPHAGOGASTRODUODENOSCOPY (EGD);  Surgeon: [name], MD;  Location: [facility];  Service: Endoscopy;  Laterality: N/A;   FLEXIBLE SIGMOIDOSCOPY N/A 02/19/2016   Procedure: FLEXIBLE SIGMOIDOSCOPY;  Surgeon: [name], MD;  [facility];  Service: Endoscopy;   Laterality: N/A;   HOLMIUM LASER APPLICATION Left 1/61/0960   Procedure: HOLMIUM LASER APPLICATION;  Surgeon: Lucas Mallow, MD;  [facility];  Service: Urology;  Laterality: Left;   LAPAROTOMY N/A 02/12/2013   Procedure: EXPLORATORY LAPAROTOMY PARTIAL COLECTOMY WITH COLOSTOMY;  Surgeon: [name], MD;  Location: [place];  Service: General;  Laterality: N/A;   LAPAROTOMY N/A 02/18/2013   Procedure: EXPLORATORY LAPAROTOMY/Closure of Wound;  Surgeon: [name], MD;  Location: [place];  Service: General;  Laterality: N/A;   LAPAROTOMY N/A 04/19/2016   Procedure: EXPLORATORY LAPAROTOMY, REPAIR OF ANASTAMOTIC LEAK;  Surgeon: Judeth Horn, MD;  Location: [place];  Service: General;  Laterality: N/A;   LEFT HEART CATHETERIZATION WITH CORONARY ANGIOGRAM N/A 06/11/2014   Procedure: LEFT HEART CATHETERIZATION WITH CORONARY ANGIOGRAM;  Surgeon: [name], MD; CFX calcified, 30-40 percent, RCA calcified, 40/50/40%, PDA diffuse disease, LAD 40/75/90% s/p DES 2    POLYPECTOMY  12/28/2018   Procedure: POLYPECTOMY;  Surgeon: [name], MD;  Location: [facility];  Service: Endoscopy;;    Current Outpatient Medications  Medication  Sig Dispense Refill   aspirin EC 81 MG tablet Take 1 tablet (81 mg total) by mouth daily. 30 tablet 0   atorvastatin (LIPITOR) 40 MG tablet Take 1 tablet (40 mg total) by mouth daily. 90 tablet 3   clopidogrel (PLAVIX) 75 MG tablet Take 1 tablet (75 mg total) by mouth daily with breakfast. 90 tablet 3   diltiazem (CARDIZEM) 60 MG tablet TAKE 1 & 1/2 TABLET BY MOUTH 3 TIMES DAILY. 405 tablet 2   ferrous sulfate 325 (65 FE) MG tablet Take 1 tablet (325 mg total) by mouth daily with breakfast. 30 tablet 1   furosemide (LASIX) 40 MG tablet Take 1.5 tablets (60 mg total) by mouth daily. 135 tablet 3   levalbuterol (XOPENEX HFA) 45 MCG/ACT inhaler INHALE 2 PUFFS BY MOUTH EVERY 4 HOURS AS NEEDED FOR WHEEZING 15 each 1   metoprolol tartrate (LOPRESSOR) 25 MG tablet Take  1.5 tablets (37.5 mg total) by mouth 2 (two) times daily. 270 tablet 3   Multiple Vitamin (MULTI-VITAMIN DAILY) TABS Take by mouth.     nitroGLYCERIN (NITROSTAT) 0.4 MG SL tablet Place 0.4 mg under the tongue every 5 (five) minutes as needed for chest pain.     simethicone (MYLICON) 280 MG chewable tablet Chew 125 mg by mouth 3 (three) times daily.     spironolactone (ALDACTONE) 25 MG tablet Take 0.5 tablets (12.5 mg total) by mouth daily. 45 tablet 3   tamsulosin (FLOMAX) 0.4 MG CAPS capsule Take 1 capsule (0.4 mg total) by mouth daily. 90 capsule 3   No current facility-administered medications for this visit.    Allergies:   Patient has no known allergies.   Social History:  The patient  reports that he quit smoking about 12 years ago. His smoking use included cigarettes. He has a 38.00 pack-year smoking history. He has never used smokeless tobacco. He reports current alcohol use. He reports that he does not use drugs.   Family History:  The patient's family history includes Dementia in his mother; Diabetes in his father; Heart disease in his father; Hypertension in his father and mother; Leukemia in his maternal uncle; Liver cancer in his maternal grandmother; Parkinsonism in his mother; Prostate cancer in his maternal uncle.  ROS:  Please see the history of present illness.    All other systems are reviewed and otherwise negative.   PHYSICAL EXAM:  VS:  There were no vitals taken for this visit. BMI: There is no height or weight on file to calculate BMI. Well nourished, well developed, in no acute distress HEENT: normocephalic, atraumatic Neck: no JVD, carotid bruits or masses Cardiac:  RRR; soft SM, no rubs, or gallops Lungs:  CTA b/l, no wheezing, rhonchi or rales Abd: soft, nontender, ileostomy, has a dressing mid-line MS: no deformity or atrophy Ext: marked edema, chronic looking and very dry skin changes, he reports his edema at his baseline without change for years Skin: warm  and dry, no rash Neuro:  No gross deficits appreciated Psych: euthymic mood, full affect    EKG:  Done today and reviewed by myself shows  01/01/20 EKG is reviewed, SRT 106bpm, 1st degree AVblock, PR 269m, IVCD QRS 1179m   Echo Study Conclusions 03/2016 - Left ventricle: The cavity size was normal. Wall thickness was   increased in a pattern of moderate LVH. Systolic function was   normal. The estimated ejection fraction was in the range of 55%   to 60%. Wall motion was normal; there were  no regional wall   motion abnormalities. The study is not technically sufficient to   allow evaluation of LV diastolic function. - Aortic root: The aortic root was mildly dilated. - Mitral valve: Calcified annulus.   Impressions:   - Technically difficult; definity used; normal LV function;   moderate LVH.     MyoviewStudy Highlights 01/2016 Nuclear stress EF: 58%. There was no ST segment deviation noted during stress. There is a large defect of severe severity present in the basal inferior, mid inferior, apical septal, apical inferior and apex location. The defect is non-reversible. No ischemia noted. Tthis is consistent with diaphragmatic attenuation artifact vs. Scar. This is a low risk study. The left ventricular ejection fraction is normal (55-65%).         Procedure: Left Heart Cath, [facility]ive Coronary Angiography, PTCA and stenting of the mid and distal LAD April 2016.              Coronary angiography: Left mainstem: The left mainstem is patent. The vessel divides into the LAD and left circumflex. There is no significant stenosis, but there is mild irregularity.   Left anterior descending (LAD): The LAD is moderately calcified. The vessel is severely diseased. The proximal LAD is patent with 30-40% stenosis after the second septal perforator. There is diffuse calcification. The diagonal branches are small. The mid LAD has an eccentric 75% stenosis at the origin of the second  diagonal branch. Beyond that area the mid and distal LAD are diffusely diseased. There is another severe stenosis in the apical LAD of 90%.   Left circumflex (LCx): The left circumflex is calcified. The vessel is patent with mild irregularity. There are no high-grade stenoses identified. There are scattered 30-40% stenoses throughout the proximal and mid circumflex as well as the first OM branch.   Right coronary artery (RCA): The RCA is dominant. The vessel is heavily calcified. The stented segments in the mid and distal RCA are patent. The proximal vessel has 30-40% stenosis. The mid vessel has 30-40% stenosis. The distal vessel is patent with 50-60% stenosis involving the origin of the PDA. The PDA is diffusely diseased.   Left ventriculography: Deferred because of chronic kidney disease. By nuclear scan the LVEF is 41%.   PCI Note:  Following the diagnostic procedure, the decision was made to proceed with PCI of the mid and apical LAD.  The right coronary artery had patent stents in the left circumflex had nonobstructive disease. The LAD is a diffusely diseased vessel and the mid and distal vessel would be a poor surgical targets. I felt that PCI was the best option in this patient with a high risk nuclear scan demonstrating anteroapical and anterolateral ischemia. He will require colostomy reversal, so I planned on treating him with Synergy drug-eluting stents which had a biodegradable polymer and potentially can allow for earlier interruption of dual antiplatelet therapy. The patient was loaded with Plavix 600 mg. Weight-based bivalirudin was given for anticoagulation. Once a therapeutic ACT was achieved, a 6 [place] XB LAD 3.5 cm guide catheter was inserted.  A cougar coronary guidewire was used to cross the lesion in the apical LAD.  The lesion was predilated with a 2.5 x 12 mm balloon.  The same balloon was used to dilate the mid lesion. The apical lesion was then stented with a 2.25 x 16 mm Synergy  DES.  The stent was postdilated with a 2.5 mm noncompliant balloon.  The mid lesion was then stented with a 3.5 x  16 mm Synergy DES. That stent was postdilated with a 3.75 mm noncompliant balloon. Following PCI, there was 0% residual stenosis and TIMI-3 flow. Final angiography confirmed an excellent result. The patient tolerated the procedure well. There were no immediate procedural complications. A TR band was used for radial hemostasis. The patient was transferred to the post catheterization recovery area for further monitoring.   PCI Data: Lesion 1: Vessel - LAD/Segment - distal/apical Percent Stenosis (pre)  90 TIMI-flow 3 Stent 2.25 x 16 mm Synergy DES Percent Stenosis (post) 0 TIMI-flow (post) 3   Lesion 2: Vessel - LAD/Segment - mid Percent Stenosis (pre)  75 TIMI-flow 3 Stent 3.5 x 16 mm Synergy DES Percent Stenosis (post) 0 TIMI-flow (post) 3   Estimated Blood Loss: Minimal   Final Conclusions:    1. Two-vessel coronary artery disease with continued patency of the stented segments in the right coronary artery and severe stenoses of the mid and apical LAD 2. Mild diffuse nonobstructive left circumflex stenosis 3. Successful PCI of the LAD using 2 drug-eluting stents    Recommendations:  Dual antiplatelet therapy for at least 6 months. Would be reasonable to consider interruption of aspirin and Plavix at 6 months for colostomy reversal.     Recent Labs: 01/01/2020: ALT 21; BUN 22; Creatinine, Ser 2.40; Hemoglobin 12.8; Platelets 133; Potassium 4.1; Sodium 139  01/01/2020: Chol/HDL Ratio 2.8; Cholesterol, Total 161; HDL 58; LDL Chol Calc (NIH) 76; Triglycerides 158   CrCl cannot be calculated (Patient's most recent lab result is older than the maximum 21 days allowed.).   Wt Readings from Last 3 Encounters:  04/28/20 (!) 361 lb (163.7 kg)  01/01/20 (!) 362 lb 3.2 oz (164.3 kg)  09/19/19 (!) 362 lb 9.6 oz (164.5 kg)     Other studies reviewed: Additional  studies/records reviewed today include: summarized above  ASSESSMENT AND PLAN:  1. Atach     Not an ablation candidate     No palpitations     Continue CCB, BB  2. CAD     no symptoms of angina     On ASA, plavix, BB, statin     Labs today     Update hie echo  3. HTN     Looks good  4. HLD     Labs today   5. Morbid obesity 6. Chronic marked edema 7. ICM w/recovered LVEF, diastolic dysfunction     Follows PMD and nephrology     Reports at his baseline unchanged for years     No changes, defer diuretics to his PMD and nephrologist    Discussed Dr. Rayann Heman will be leaving the practice at the new year, I think he will be best searved by one of our general/interventional cardiologists rather then EP.  He is in agreement Will have him see Dr. Ali Lowe     Disposition: pending his labs/echo, otherwise plan for 65mo sooner if needed  Current medicines are reviewed at length with the patient today.  The patient did not have any concerns regarding medicines.  SVenetia Night PA-C 11/18/2020 3:52 PM     [place] [phone](office)  [phone](fax)

## 2020-11-23 ENCOUNTER — Ambulatory Visit (INDEPENDENT_AMBULATORY_CARE_PROVIDER_SITE_OTHER): Payer: Medicare Other | Admitting: Physician Assistant

## 2020-11-23 ENCOUNTER — Other Ambulatory Visit: Payer: Self-pay

## 2020-11-23 ENCOUNTER — Encounter: Payer: Self-pay | Admitting: Physician Assistant

## 2020-11-23 VITALS — BP 112/60 | HR 104 | Ht 71.0 in | Wt 356.8 lb

## 2020-11-23 DIAGNOSIS — I2510 Atherosclerotic heart disease of native coronary artery without angina pectoris: Secondary | ICD-10-CM | POA: Diagnosis not present

## 2020-11-23 DIAGNOSIS — I5032 Chronic diastolic (congestive) heart failure: Secondary | ICD-10-CM

## 2020-11-23 DIAGNOSIS — I259 Chronic ischemic heart disease, unspecified: Secondary | ICD-10-CM

## 2020-11-23 DIAGNOSIS — I471 Supraventricular tachycardia: Secondary | ICD-10-CM | POA: Diagnosis not present

## 2020-11-23 DIAGNOSIS — E785 Hyperlipidemia, unspecified: Secondary | ICD-10-CM | POA: Diagnosis not present

## 2020-11-23 DIAGNOSIS — I4719 Other supraventricular tachycardia: Secondary | ICD-10-CM

## 2020-11-23 NOTE — Patient Instructions (Signed)
Medication Instructions:   Your physician recommends that you continue on your current medications as directed. Please refer to the Current Medication list given to you today.  *If you need a refill on your cardiac medications before your next appointment, please call your pharmacy*   Lab Work:   CMET CBC AND LIPIDS   If you have labs (blood work) drawn today and your tests are completely normal, you will receive your results only by: [place] (if you have MyChart) OR A paper copy in the mail If you have any lab test that is abnormal or we need to change your treatment, we will call you to review the results.   Testing/Procedures: Your physician has requested that you have an echocardiogram. Echocardiography is a painless test that uses sound waves to create images of your heart. It provides your doctor with information about the size and shape of your heart and how well your heart's chambers and valves are working. This procedure takes approximately one hour. There are no restrictions for this procedure.   Follow-Up: At [facility], you and your health needs are our priority.  As part of our continuing mission to provide you with exceptional heart care, we have created designated Provider Care Teams.  These Care Teams include your primary Cardiologist (physician) and Advanced Practice Providers (APPs -  Physician Assistants and Nurse Practitioners) who all work together to provide you with the care you need, when you need it.  We recommend signing up for the patient portal called "MyChart".  Sign up information is provided on this After Visit Summary.  MyChart is used to connect with patients for Virtual Visits (Telemedicine).  Patients are able to view lab/test results, encounter notes, upcoming appointments, etc.  Non-urgent messages can be sent to your provider as well.   To learn more about what you can do with MyChart, go to [email or web address].    Your next appointment:    3 month(s)  The format for your next appointment:   In Person  Provider: Dr. Ali Lowe  Follow up with EP  [place]

## 2020-11-24 LAB — CBC
Hematocrit: 39.1 % (ref 37.5–51.0)
Hemoglobin: 13 g/dL (ref 13.0–17.7)
MCH: 29 pg (ref 26.6–33.0)
MCHC: 33.2 g/dL (ref 31.5–35.7)
MCV: 87 fL (ref 79–97)
Platelets: 176 10*3/uL (ref 150–450)
RBC: 4.48 x10E6/uL (ref 4.14–5.80)
RDW: 14 % (ref 11.6–15.4)
WBC: 6.2 10*3/uL (ref 3.4–10.8)

## 2020-11-24 LAB — COMPREHENSIVE METABOLIC PANEL
ALT: 20 IU/L (ref 0–44)
AST: 16 IU/L (ref 0–40)
Albumin/Globulin Ratio: 1.8 (ref 1.2–2.2)
Albumin: 4.7 g/dL (ref 3.8–4.8)
Alkaline Phosphatase: 147 IU/L — ABNORMAL HIGH (ref 44–121)
BUN/Creatinine Ratio: 10 (ref 10–24)
BUN: 23 mg/dL (ref 8–27)
Bilirubin Total: 1.1 mg/dL (ref 0.0–1.2)
CO2: 19 mmol/L — ABNORMAL LOW (ref 20–29)
Calcium: 10 mg/dL (ref 8.6–10.2)
Chloride: 102 mmol/L (ref 96–106)
Creatinine, Ser: 2.33 mg/dL — ABNORMAL HIGH (ref 0.76–1.27)
Globulin, Total: 2.6 g/dL (ref 1.5–4.5)
Glucose: 94 mg/dL (ref 70–99)
Potassium: 4.1 mmol/L (ref 3.5–5.2)
Sodium: 140 mmol/L (ref 134–144)
Total Protein: 7.3 g/dL (ref 6.0–8.5)
eGFR: 30 mL/min/{1.73_m2} — ABNORMAL LOW (ref >59)

## 2020-11-24 LAB — LIPID PANEL
Chol/HDL Ratio: 2.8 ratio (ref 0.0–5.0)
Cholesterol, Total: 148 mg/dL (ref 100–199)
HDL: 53 mg/dL (ref >39)
LDL Chol Calc (NIH): 71 mg/dL (ref 0–99)
Triglycerides: 140 mg/dL (ref 0–149)
VLDL Cholesterol Cal: 24 mg/dL (ref 5–40)

## 2020-12-09 ENCOUNTER — Other Ambulatory Visit: Payer: Self-pay

## 2020-12-09 ENCOUNTER — Ambulatory Visit (HOSPITAL_COMMUNITY): Payer: Medicare Other | Attending: Cardiology

## 2020-12-09 DIAGNOSIS — I2510 Atherosclerotic heart disease of native coronary artery without angina pectoris: Secondary | ICD-10-CM

## 2020-12-09 DIAGNOSIS — I5032 Chronic diastolic (congestive) heart failure: Secondary | ICD-10-CM | POA: Diagnosis present

## 2020-12-09 LAB — ECHOCARDIOGRAM COMPLETE
Area-P 1/2: 5.46 cm2
S' Lateral: 4.5 cm

## 2020-12-09 MED ORDER — PERFLUTREN LIPID MICROSPHERE
1.0000 mL | INTRAVENOUS | Status: AC | PRN
  Administered 2020-12-09: 2 mL via INTRAVENOUS
  Administered 2020-12-09: 3 mL via INTRAVENOUS

## 2020-12-10 ENCOUNTER — Other Ambulatory Visit: Payer: Self-pay | Admitting: Internal Medicine

## 2021-02-21 NOTE — Progress Notes (Signed)
Cardiology Office Note:    Date:  02/21/2021   ID:  Larry, [name] [birth year], MRN [id number]  PCP:  Lin Landsman, MD   [facility] Providers Cardiologist:  Lenna Sciara, MD Referring MD: Lin Landsman, MD   Chief Complaint/Reason for Referral: Establish general cardiovascular care  ASSESSMENT:    Ischemic cardiomyopathy  Coronary artery disease involving native coronary artery of native heart without angina pectoris  Dyslipidemia  Primary hypertension  Atrial tachycardia, paroxysmal (HCC)  Chronic systolic heart failure ([place])  Stage 3a chronic kidney disease ([place])    PLAN:    In order of problems listed above:  Patient is largely sedentary with his shortness of breath fairly well controlled.  Continue beta-blocker, Lasix, and spironolactone.  We will start Jardiance 10 mg as patient's GFR is greater than 20.  Consider losartan later if Cr permits in the future.  F/U 6 months.  2.  Continue beta-blocker aspirin, and statin.  Since PCI is remote we will DC Plavix.  3.  Recent lipid panel was at goal.  4.  Blood pressure is mildly elevated today.  Continue current therapy.  5.  Continue calcium channel blocker despite mild LV dysfunction.  6.  The patient is fairly euvolemic today.  7.  Creatinine remains around 2, monitor             Dispo:  No follow-ups on file.     Medication Adjustments/Labs and Tests Ordered: Current medicines are reviewed at length with the patient today.  Concerns regarding medicines are outlined above.   Tests Ordered: No orders of the defined types were placed in this encounter.   Medication Changes: No orders of the defined types were placed in this encounter.   History of Present Illness:    FOCUSED CARDIOVASCULAR PROBLEM LIST:   1.  Coronary artery disease status post PCI of the right coronary and left anterior descending arteries remotely 2.  Cardiomyopathy with ejection fraction of 45 to 50% 3.   Atrial tachycardia 4.  Hypertension 5.  Hyperlipidemia 6.  Chronic kidney disease with a creatinine of around 2 7.  Obstructive sleep apnea on CPAP 8.  Morbid obesity  The patient is a 71 y.o. male with the indicated medical history here to establish general cardiovascular care.  The patient was recently seen by the EP division and was doing relatively well.  He is largely sedentary and is able to do some physical activity but not much.     Previous Medical History: Past Medical History:  Diagnosis Date   Arthritis    Atrial tachycardia ([place])    managed on beta blocker therapy   Childhood asthma    "went away after I was 16"   Chronic combined systolic and diastolic CHF, NYHA class 3 (HCC)    has diastolic heart failure grade 1; EF is 45 to 50% per echo 05/2011; EF 41% by Myoview 2016   CKD (chronic kidney disease) stage 3, GFR 30-59 ml/min (HCC)    Colon cancer ([place]) 2015   MSI high; IHC loss of MLH1 and PMS2; BRAF negative; Negative methylation   COPD (chronic obstructive pulmonary disease) (HCC)    Coronary artery disease    Family history of ovarian cancer    Hypercholesteremia    Hypertension    Noncompliance    NSVT (nonsustained ventricular tachycardia)    beta blocker restarted   Obesity    OSA on CPAP    used nightly, pt does not know settings  Peripheral arterial disease (HCC)    4.2 cm thoracic aortic aneurysm per chest ct 11-14-15 epic   S/P colostomy ([place])    2014   Thrombocytopenia ([place])    Torsades de pointes    X 2 episodes during hospital visit 12'14"electrolyte imbalance"- "Shocked"     Current Medications: No outpatient medications have been marked as taking for the 02/25/21 encounter (Appointment) with Early Osmond, MD.     Allergies:    Patient has no known allergies.   Social History:   Social History   Tobacco Use   Smoking status: Former    Packs/day: 1.00    Years: 38.00    Pack years: 38.00    Types: Cigarettes    Quit date:  07/15/2008    Years since quitting: 12.6   Smokeless tobacco: Never  Vaping Use   Vaping Use: Never used  Substance Use Topics   Alcohol use: Yes    Alcohol/week: 0.0 standard drinks    Comment: none snice colostomy placed   Drug use: No     Family Hx: Family History  Problem Relation Age of Onset   Hypertension Father    Heart disease Father        before age 6   Diabetes Father    Hypertension Mother    Dementia Mother    Parkinsonism Mother    Liver cancer Maternal Grandmother        dx in her 68s   Prostate cancer Maternal Uncle    Leukemia Maternal Uncle      Review of Systems:   Please see the history of present illness.    All other systems reviewed and are negative.  EKGs/Labs/Other Test Reviewed:    EKG:  EKG today not done; prior EKG: Sinus tachycardia  Prior CV studies:  No results found for this or any previous visit from the past 3650 days.   ECHO COMPLETE WITH IMAGING ENHANCING AGENT 12/09/2020  1. Left ventricular ejection fraction, by estimation, is 45 to 50%. The left ventricle has mildly decreased function. The left ventricle demonstrates global hypokinesis. The left ventricular internal cavity size was mildly dilated. There is mild left ventricular hypertrophy. Left ventricular diastolic parameters are consistent with Grade I diastolic dysfunction (impaired relaxation). 2. Right ventricular systolic function is normal. The right ventricular size is normal. 3. The mitral valve is normal in structure. No evidence of mitral valve regurgitation. No evidence of mitral stenosis. 4. The aortic valve is normal in structure. There is mild calcification of the aortic valve. There is mild thickening of the aortic valve. Aortic valve regurgitation is not visualized. Mild aortic valve sclerosis is present, with no evidence of aortic valve stenosis. 5. The inferior vena cava is normal in size with greater than 50% respiratory variability, suggesting right atrial  pressure of 3 mmHg.  FINDINGS Left Ventricle: Left ventricular ejection fraction, by estimation, is 45 to 50%. The left ventricle has mildly decreased function. The left ventricle demonstrates global hypokinesis. The left ventricular internal cavity size was mildly dilated. There is mild left ventricular hypertrophy. Left ventricular diastolic parameters are consistent with Grade I diastolic dysfunction (impaired relaxation).      Imaging studies that I have independently reviewed today: echo  Recent Labs: 11/23/2020: ALT 20; BUN 23; Creatinine, Ser 2.33; Hemoglobin 13.0; Platelets 176; Potassium 4.1; Sodium 140   Recent Lipid Panel Lab Results  Component Value Date/Time   CHOL 148 11/23/2020 02:13 PM   TRIG 140 11/23/2020 02:13 PM  HDL 53 11/23/2020 02:13 PM   LDLCALC 71 11/23/2020 02:13 PM    Risk Assessment/Calculations:           Physical Exam:    VS:  There were no vitals taken for this visit.   Wt Readings from Last 3 Encounters:  11/23/20 (!) 356 lb 12.8 oz (161.8 kg)  04/28/20 (!) 361 lb (163.7 kg)  01/01/20 (!) 362 lb 3.2 oz (164.3 kg)    GENERAL:  No apparent distress, AOx3 HEENT:  No carotid bruits, +2 carotid impulses, no scleral icterus CAR: RRR  no murmurs, gallops, rubs, or thrills RES:  Clear to auscultation bilaterally ABD:  Soft, nontender, nondistended, positive bowel sounds x 4 VASC:  +2 radial pulses, +2 carotid pulses, palpable pedal pulses NEURO:  CN 2-12 grossly intact; motor and sensory grossly intact PSYCH:  No active depression or anxiety EXT:  No edema, ecchymosis, or cyanosis  Signed, Early Osmond, MD  02/21/2021 10:43 AM    [address], [place], [place]  [address] Phone: [phone]; Fax: [phone]   Note:  This document was prepared using Dragon voice recognition software and may include unintentional dictation errors.

## 2021-02-25 ENCOUNTER — Other Ambulatory Visit: Payer: Self-pay | Admitting: Internal Medicine

## 2021-02-25 ENCOUNTER — Ambulatory Visit (INDEPENDENT_AMBULATORY_CARE_PROVIDER_SITE_OTHER): Payer: Medicare Other | Admitting: Internal Medicine

## 2021-02-25 ENCOUNTER — Encounter: Payer: Self-pay | Admitting: Internal Medicine

## 2021-02-25 ENCOUNTER — Other Ambulatory Visit: Payer: Self-pay

## 2021-02-25 VITALS — BP 142/62 | HR 74 | Ht 71.0 in | Wt 364.8 lb

## 2021-02-25 DIAGNOSIS — I255 Ischemic cardiomyopathy: Secondary | ICD-10-CM

## 2021-02-25 DIAGNOSIS — I5022 Chronic systolic (congestive) heart failure: Secondary | ICD-10-CM

## 2021-02-25 DIAGNOSIS — E785 Hyperlipidemia, unspecified: Secondary | ICD-10-CM

## 2021-02-25 DIAGNOSIS — I471 Supraventricular tachycardia: Secondary | ICD-10-CM

## 2021-02-25 DIAGNOSIS — I10 Essential (primary) hypertension: Secondary | ICD-10-CM | POA: Diagnosis not present

## 2021-02-25 DIAGNOSIS — I4719 Other supraventricular tachycardia: Secondary | ICD-10-CM

## 2021-02-25 DIAGNOSIS — I2510 Atherosclerotic heart disease of native coronary artery without angina pectoris: Secondary | ICD-10-CM

## 2021-02-25 DIAGNOSIS — N1831 Chronic kidney disease, stage 3a: Secondary | ICD-10-CM

## 2021-02-25 MED ORDER — EMPAGLIFLOZIN 10 MG PO TABS: 10.0000 mg | ORAL_TABLET | Freq: Every day | ORAL | 3 refills | Status: DC

## 2021-02-25 NOTE — Patient Instructions (Signed)
Medication Instructions:  Your physician has recommended you make the following change in your medication:  1.) stop clopidogrel (Plavix) 2.) start Jardiance 10 mg - one tablet by mouth daily  *If you need a refill on your cardiac medications before your next appointment, please call your pharmacy*   Lab Work: none   Testing/Procedures: none   Follow-Up: At [facility], you and your health needs are our priority.  As part of our continuing mission to provide you with exceptional heart care, we have created designated Provider Care Teams.  These Care Teams include your primary Cardiologist (physician) and Advanced Practice Providers (APPs -  Physician Assistants and Nurse Practitioners) who all work together to provide you with the care you need, when you need it.   Your next appointment:   6 month(s)  The format for your next appointment:   In Person  Provider:   Lenna Sciara, MD    Other Instructions

## 2021-03-22 ENCOUNTER — Telehealth: Payer: Self-pay | Admitting: Internal Medicine

## 2021-03-22 NOTE — Telephone Encounter (Signed)
Pt c/o medication issue:  1. Name of Medication:  empagliflozin (JARDIANCE) 10 MG TABS tablet  2. How are you currently taking this medication (dosage and times per day)? Not currently taking   3. Are you having a reaction (difficulty breathing--STAT)? No  4. What is your medication issue? Patient is calling stating the pharmacy advised he is needing PA.

## 2021-03-23 NOTE — Telephone Encounter (Signed)
**Note De-Identified  Obfuscation** Jardiance PA started through covermymeds. KeyChevis Pretty

## 2021-03-24 ENCOUNTER — Other Ambulatory Visit: Payer: Self-pay | Admitting: Internal Medicine

## 2021-03-24 DIAGNOSIS — I471 Supraventricular tachycardia: Secondary | ICD-10-CM

## 2021-03-24 DIAGNOSIS — I259 Chronic ischemic heart disease, unspecified: Secondary | ICD-10-CM

## 2021-03-24 DIAGNOSIS — R0602 Shortness of breath: Secondary | ICD-10-CM

## 2021-03-24 DIAGNOSIS — E7800 Pure hypercholesterolemia, unspecified: Secondary | ICD-10-CM

## 2021-03-24 DIAGNOSIS — I5022 Chronic systolic (congestive) heart failure: Secondary | ICD-10-CM

## 2021-03-24 NOTE — Telephone Encounter (Signed)
**Note De-Identified  Obfuscation** [facility] Jimmye Norman KeyChevis Pretty - PA Case ID: [id number] Outcome: Approved on January 31 Your PA request has been approved. Additional information will be provided in the approval communication. (Message 1145) Drug: Jardiance 10MG  tablets Form [employer] Electronic PA Form [phone] NCPDP)  I have notified the pt of this approval.

## 2021-04-12 NOTE — Progress Notes (Signed)
HPI male former smoker followed for OSA complicated by COPD, ischemic cardiomyopathy, hypertension, history CHF, colon cancer, Obesity NPSG- 12/04/13- AHI 24.6/ hr, CPAP to 14, weight  348 lbs  ----------------------------------------------------------------------------   09/19/19- 71 year old male former smoker followed for OSA complicated by COPD, HTN, CAD, ischemic cardiomyopathy, dCHF, hypertension , AFib , colon cancer/ colostomy, Obesity, Chronic Venous Insufficiency/ Stasis Ulcers,,  CPAP auto 10-20/Adapt xopenex hfa, Replaced old CPAP machine since last here. Download compliance 100%, AHI 1.8/ hr Body weight today 362 lbs Had 2 Phizer Covax Rarely needs rescue inhaler  04/13/21- 71 year old male former smoker followed for OSA complicated by COPD, HTN, CAD, ischemic cardiomyopathy, dCHF, hypertension , AFib , colon cancer/ colostomy, Obesity, Chronic Venous Insufficiency/ Stasis Ulcers,,  CPAP auto 10-20/Adapt -xopenex hfa, Download compliance -Can't confirm use Body weight today 358 lbs Covid vax-5 Phizer Flu vax-had When asked why he was not using CPAP regularly he indicated the mask was uncomfortable.  He has not contacted his homecare company.  I suggested mask fitting and discussed compliance goals. He has not felt need to use his rescue inhaler at all and denies any new lung or heart issues.  ROS-see HPI  + = positive Constitutional:   No-   weight loss, night sweats, fevers, chills, fatigue, lassitude. HEENT:   No-  headaches, difficulty swallowing, tooth/dental problems, sore throat,       No-  sneezing, itching, ear ache, nasal congestion, post nasal drip,  CV:  No-   chest pain, orthopnea, PND, swelling in lower extremities, anasarca,                                                     dizziness, palpitations Resp: +  shortness of breath with exertion or at rest.              No-   productive cough,  No non-productive cough,  No- coughing up of blood.              No-    change in color of mucus.  No- wheezing.   Skin: No-   rash or lesions. GI:  No-   heartburn, indigestion, abdominal pain, nausea, vomiting,  GU:  MS:  No-   joint pain or swelling.   Neuro-     nothing unusual Psych:  No- change in mood or affect. No depression or anxiety.  No memory loss.  OBJ- Physical Exam  big man, + obese General- Alert, Oriented, Affect-appropriate, Distress- none acute Skin- rash-none, lesions- none, excoriation- none Lymphadenopathy- none Head- atraumatic            Eyes- Gross vision intact, PERRLA, conjunctivae and secretions clear            Ears- Hearing, canals-normal            Nose- Clear, no-Septal dev, mucus, polyps, erosion, perforation             Throat- Mallampati III-IV , mucosa clear , drainage- none, tonsils- atrophic, + missing teeth/ poor hygiene, Neck- flexible , trachea midline, no stridor , thyroid nl, carotid no bruit Chest - symmetrical excursion , unlabored           Heart/CV-+ IRR(faint) , no murmur , no gallop  , no rub, nl s1 s2                           -  JVD- none , edema- none, stasis changes- none, varices- none           Lung- clear to P&A, wheeze- none, cough- none , dullness-none, rub- none           Chest wall-  Abd- +right colostomy Br/ Gen/ Rectal- Not done, not indicated Extrem- cyanosis- none, clubbing, none, atrophy- none, strength- nl Neuro- grossly intact to observation

## 2021-04-13 ENCOUNTER — Encounter: Payer: Self-pay | Admitting: Internal Medicine

## 2021-04-13 ENCOUNTER — Ambulatory Visit (INDEPENDENT_AMBULATORY_CARE_PROVIDER_SITE_OTHER): Payer: Medicare Other | Admitting: Internal Medicine

## 2021-04-13 ENCOUNTER — Other Ambulatory Visit: Payer: Self-pay

## 2021-04-13 VITALS — BP 120/60 | HR 92 | Temp 98.1°F | Ht 71.0 in | Wt 358.6 lb

## 2021-04-13 DIAGNOSIS — I4891 Unspecified atrial fibrillation: Secondary | ICD-10-CM | POA: Diagnosis not present

## 2021-04-13 DIAGNOSIS — G4733 Obstructive sleep apnea (adult) (pediatric): Secondary | ICD-10-CM | POA: Diagnosis not present

## 2021-04-13 DIAGNOSIS — I259 Chronic ischemic heart disease, unspecified: Secondary | ICD-10-CM

## 2021-04-13 DIAGNOSIS — J449 Chronic obstructive pulmonary disease, unspecified: Secondary | ICD-10-CM | POA: Diagnosis not present

## 2021-04-13 NOTE — Assessment & Plan Note (Signed)
Reinforced compliance goals and medical reasons for using CPAP. Plan-continue auto 10-20.  Refer for mask fitting

## 2021-04-13 NOTE — Assessment & Plan Note (Signed)
No exacerbation or active concern about his breathing. Not very active. Hasn't felt need to use rescue inhaler at all. He will call as needed.

## 2021-04-13 NOTE — Assessment & Plan Note (Signed)
Pulse is very nearly regular at [facility] visit. Follows with Cardiology

## 2021-04-13 NOTE — Patient Instructions (Signed)
Order- refer for mask fitting desensitization  It is important for your heart that you use your CPAP whenever you sleep. If it is uncomfortable, please let us know.  It will also help your breathing if you can try to lose some more weight.

## 2021-04-13 NOTE — Assessment & Plan Note (Signed)
SupportHe will likely need external program to accomplish lifestyle change with diet and exercise needed for meaningful weight loss.

## 2021-04-27 ENCOUNTER — Other Ambulatory Visit (HOSPITAL_BASED_OUTPATIENT_CLINIC_OR_DEPARTMENT_OTHER): Payer: Federal, State, Local not specified - PPO | Admitting: Internal Medicine

## 2021-04-28 ENCOUNTER — Other Ambulatory Visit (HOSPITAL_BASED_OUTPATIENT_CLINIC_OR_DEPARTMENT_OTHER): Payer: Federal, State, Local not specified - PPO | Admitting: Internal Medicine

## 2021-05-12 ENCOUNTER — Other Ambulatory Visit: Payer: Self-pay | Admitting: Internal Medicine

## 2021-06-10 ENCOUNTER — Other Ambulatory Visit: Payer: Self-pay | Admitting: Internal Medicine

## 2021-07-03 ENCOUNTER — Other Ambulatory Visit: Payer: Self-pay | Admitting: Internal Medicine

## 2021-07-05 NOTE — Telephone Encounter (Signed)
Patient Instructions by Rodman Key, RN at 02/25/2021 9:20 AM ? ?Author: Rodman Key, RN Author Type: Registered Nurse Filed: 02/25/2021 10:18 AM  ?Note Status: Signed Cosign: Cosign Not Required Encounter Date: 02/25/2021  ?Editor: Rodman Key, RN (Registered Nurse)      ?       ?Medication Instructions:  ?Your physician has recommended you make the following change in your medication:  ?1.) stop clopidogrel (Plavix) ?2.) start Jardiance 10 mg - one tablet by mouth daily ?  ?  ? ? ?

## 2021-07-27 ENCOUNTER — Other Ambulatory Visit: Payer: Self-pay | Admitting: Internal Medicine

## 2021-08-01 ENCOUNTER — Other Ambulatory Visit: Payer: Self-pay | Admitting: Internal Medicine

## 2021-08-01 ENCOUNTER — Other Ambulatory Visit: Payer: Self-pay | Admitting: Physician Assistant

## 2021-08-03 NOTE — Telephone Encounter (Signed)
Pt's pharmacy is requesting a refill on tamsulosin. This is not a cardiac medication, would [facility] like to refill this medication? Please address

## 2021-08-29 ENCOUNTER — Encounter: Payer: Self-pay | Admitting: Physician Assistant

## 2021-08-29 NOTE — Progress Notes (Unsigned)
Cardiology Clinic Note   Patient Name: Larry [name] Date of Encounter: 08/29/2021  Primary Care Provider:  Lin Landsman, MD Primary Cardiologist:  None  Patient Profile    Larry [name] is a 71 y.o. male who presents to the clinic today for a 6 month follow-up.   Past Medical History    Past Medical History:  Diagnosis Date   Arthritis    Atrial tachycardia ([place])    CAD in native artery    Childhood asthma    "went away after I was 16"   Chronic combined systolic and diastolic CHF, NYHA class 3 (HCC)    CKD (chronic kidney disease) stage 3, GFR 30-59 ml/min (HCC)    Colon cancer ([place]) 2015   MSI high; IHC loss of MLH1 and PMS2; BRAF negative; Negative methylation   COPD (chronic obstructive pulmonary disease) (HCC)    Coronary artery disease    Family history of ovarian cancer    Hypercholesteremia    Hypertension    Noncompliance    NSVT (nonsustained ventricular tachycardia) (HCC)    Obesity    OSA on CPAP    used nightly, pt does not know settings   S/P colostomy ([place])    2014   Thoracic aortic aneurysm (HCC)    4.2 cm thoracic aortic aneurysm per chest ct 11-14-15 epic   Thrombocytopenia ([place])    Torsades de pointes ([place])    X 2 episodes during hospital visit 12'14"electrolyte imbalance"- "Shocked"   Past Surgical History:  Procedure Laterality Date   COLON SURGERY     COLONOSCOPY N/A 02/08/2013   Procedure: COLONOSCOPY;  Surgeon: [name], MD;  Location: [facility] ENDOSCOPY;  Service: Endoscopy;  Laterality: N/A;   COLONOSCOPY WITH PROPOFOL N/A 05/09/2014   Procedure: COLONOSCOPY WITH PROPOFOL;  Surgeon: [name], MD;  Location: [facility] ENDOSCOPY;  Service: Endoscopy;  Laterality: N/A;   COLONOSCOPY WITH PROPOFOL N/A 01/08/2016   Procedure: COLONOSCOPY WITH PROPOFOL;  Surgeon: [name], MD;  Location: [facility] ENDOSCOPY;  Service: Endoscopy;  Laterality: N/A;   COLONOSCOPY WITH PROPOFOL N/A 12/28/2018   Procedure: COLONOSCOPY WITH PROPOFOL;   Surgeon: [name], MD;  Location: [facility] ENDOSCOPY;  Service: Endoscopy;  Laterality: N/A;   COLOSTOMY N/A 04/19/2016   Procedure: COLOSTOMY;  Surgeon: Judeth Horn, MD;  Location: [place];  Service: General;  Laterality: N/A;   COLOSTOMY REVERSAL  04/12/2016   COLOSTOMY REVERSAL N/A 04/12/2016   Procedure: COLOSTOMY REVERSAL;  Surgeon: Judeth Horn, MD;  Location: [place];  Service: General;  Laterality: N/A;   CORONARY ANGIOPLASTY WITH STENT PLACEMENT  11/12/2008; 06/11/2014   stent x 2 to RCA; stent x 2 to LAD   CORONARY ANGIOPLASTY WITH STENT PLACEMENT  06/11/2014   m-LAD 3.5 x 16 mm Synergy DES, d-LAD  2.25 x 16 mm Synergy DES   CYSTOSCOPY W/ URETERAL STENT PLACEMENT Left 06/30/2017   Procedure: CYSTOSCOPY WITH RETROGRADE PYELOGRAM/URETERAL STENT PLACEMENT;  Surgeon: Lucas Mallow, MD;  Location: [place];  Service: Urology;  Laterality: Left;   CYSTOSCOPY WITH LITHOLAPAXY N/A 08/02/2017   Procedure: CYSTOSCOPY WITH BLADDER STONE EXTRACTION;  Surgeon: Lucas Mallow, MD;  Location: [facility];  Service: Urology;  Laterality: N/A;   CYSTOSCOPY WITH RETROGRADE PYELOGRAM, URETEROSCOPY AND STENT PLACEMENT Left 08/02/2017   Procedure: CYSTOSCOPY WITH LEFT RETROGRADE PYELOGRAM, URETEROSCOPY AND STENT EXCHANGE;  Surgeon: Lucas Mallow, MD;  Location: [facility];  Service: Urology;  Laterality: Left;   ESOPHAGOGASTRODUODENOSCOPY N/A 02/08/2013  Procedure: ESOPHAGOGASTRODUODENOSCOPY (EGD);  Surgeon: [name], MD;  Location: [facility] ENDOSCOPY;  Service: Endoscopy;  Laterality: N/A;   FLEXIBLE SIGMOIDOSCOPY N/A 02/19/2016   Procedure: FLEXIBLE SIGMOIDOSCOPY;  Surgeon: [name], MD;  Location[facility]OS[facility];  Service: Endoscopy;  Laterality: N/A;   HOLMIUM LASER APPLICATION Left 6/71/2458   Procedure: HOLMIUM LASER APPLICATION;  Surgeon: Lucas Mallow, MD;  Location: [facility];  Service: Urology;  Laterality: Left;   LAPAROTOMY N/A 02/12/2013   Procedure: EXPLORATORY LAPAROTOMY PARTIAL COLECTOMY WITH  COLOSTOMY;  Surgeon: [name], MD;  Location: [place];  Service: General;  Laterality: N/A;   LAPAROTOMY N/A 02/18/2013   Procedure: EXPLORATORY LAPAROTOMY/Closure of Wound;  Surgeon: [name], MD;  Location: [place];  Service: General;  Laterality: N/A;   LAPAROTOMY N/A 04/19/2016   Procedure: EXPLORATORY LAPAROTOMY, REPAIR OF ANASTAMOTIC LEAK;  Surgeon: Judeth Horn, MD;  Location: [place];  Service: General;  Laterality: N/A;   LEFT HEART CATHETERIZATION WITH CORONARY ANGIOGRAM N/A 06/11/2014   Procedure: LEFT HEART CATHETERIZATION WITH CORONARY ANGIOGRAM;  Surgeon: [name], MD; CFX calcified, 30-40 percent, RCA calcified, 40/50/40%, PDA diffuse disease, LAD 40/75/90% s/p DES 2    POLYPECTOMY  12/28/2018   Procedure: POLYPECTOMY;  Surgeon: [name], MD;  Location: [facility] ENDOSCOPY;  Service: Endoscopy;;    Allergies  No Known Allergies  History of Present Illness    Larry [name] is a 71 y.o. male with a PMH of ICM with improved LVEF, CAD s/p PCI with stents to RCA and LAD, chronic combined systolic and diastolic CHF NYHA class 3, chronic LE edema, morbid obesity, prediabetes (Hemoglobin A1C 5.9) , atrial tachycardia, NSVT, OSA with CPAP, COPD, CKD stages *** 3 GFR 30-59 hypertension, hyperlipidemia, colon cancer s/p colostomy in 2014 (surgery was complicated by torsades (2 episodes) and prolonged QT due to profound electrolyte abnormality, and thrombocytopenia. He also has a PMH of PAD and 4.2 cm thoracic aortic aneurysm was noted on Chest CT on 11/14/15. ***  He was last seen by Dr. Ali Lowe in the Cardiology office on 02/25/2021. His shortness of breath was found to be well controlled. He was also found to be sedentary and in the past he has been home-bound and would only go out for doctor appointments and groceries. Jardiance 10 mg PO daily was started for him as his GFR was > 20. Plavix was discontinued and there were no other medication changes done or labs drawn. He is  also followed by his PCP, nephrology, and Pulmonary Disease for his OSA. The wound clinic has followed him for his chronic abdominal wound that he has had for years.  Today he presents to the office for his 6 month follow-up. He ***  Home Medications    Current Outpatient Medications  Medication Sig Dispense Refill   aspirin EC 81 MG tablet Take 1 tablet (81 mg total) by mouth daily. 30 tablet 0   atorvastatin (LIPITOR) 40 MG tablet TAKE 1 TABLET BY MOUTH EVERY DAY 90 tablet 1   diltiazem (CARDIZEM) 60 MG tablet TAKE 1 & 1/2 TABLET BY MOUTH 3 TIMES DAILY. 405 tablet 3   empagliflozin (JARDIANCE) 10 MG TABS tablet Take 1 tablet (10 mg total) by mouth daily before breakfast. 90 tablet 3   ferrous sulfate 325 (65 FE) MG tablet Take 1 tablet (325 mg total) by mouth daily with breakfast. 30 tablet 1   furosemide (LASIX) 40 MG tablet TAKE 1.5 TABLETS BY MOUTH DAILY. 135 tablet 3   levalbuterol (  XOPENEX HFA) 45 MCG/ACT inhaler TAKE 2 PUFFS BY MOUTH EVERY 4 HOURS AS NEEDED FOR WHEEZE 15 each 1   metoprolol tartrate (LOPRESSOR) 25 MG tablet TAKE 1.5 TABLETS (37.5 MG TOTAL) BY MOUTH 2 (TWO) TIMES DAILY. 270 tablet 3   Multiple Vitamin (MULTI-VITAMIN DAILY) TABS Take 1 tablet by mouth daily.     nitroGLYCERIN (NITROSTAT) 0.4 MG SL tablet Place 0.4 mg under the tongue every 5 (five) minutes as needed for chest pain.     simethicone (MYLICON) 400 MG chewable tablet Chew 125 mg by mouth 3 (three) times daily.     spironolactone (ALDACTONE) 25 MG tablet TAKE 1/2 TABLET BY MOUTH EVERY DAY 45 tablet 3   tamsulosin (FLOMAX) 0.4 MG CAPS capsule Take 1 capsule (0.4 mg total) by mouth daily. 90 capsule 3   No current facility-administered medications for this visit.     Family History    Family History  Problem Relation Age of Onset   Hypertension Father    Heart disease Father        before age 2   Diabetes Father    Hypertension Mother    Dementia Mother    Parkinsonism Mother    Liver cancer  Maternal Grandmother        dx in her 85s   Prostate cancer Maternal Uncle    Leukemia Maternal Uncle    He indicated that his mother is deceased. He indicated that his father is deceased. He indicated that his sister is alive. He indicated that his maternal grandmother is deceased. He indicated that his maternal grandfather is deceased. He indicated that his paternal grandmother is deceased. He indicated that his paternal grandfather is deceased. He indicated that his paternal aunt is deceased. He indicated that his paternal uncle is deceased.  Social History    Social History   Socioeconomic History   Marital status: Married    Spouse name: [name]   Number of children: 1   Years of education: Not on file   Highest education level: Not on file  Occupational History   Not on file  Tobacco Use   Smoking status: Former    Packs/day: 1.00    Years: 38.00    Total pack years: 38.00    Types: Cigarettes    Quit date: 07/15/2008    Years since quitting: 13.1   Smokeless tobacco: Never  Vaping Use   Vaping Use: Never used  Substance and Sexual Activity   Alcohol use: Yes    Alcohol/week: 0.0 standard drinks of alcohol    Comment: none snice colostomy placed   Drug use: No   Sexual activity: Not Currently  Other Topics Concern   Not on file  Social History Narrative   Not on file   Social Determinants of Health   Financial Resource Strain: Not on file  Food Insecurity: Not on file  Transportation Needs: Not on file  Physical Activity: Not on file  Stress: Not on file  Social Connections: Not on file  Intimate Partner Violence: Not on file     Review of Systems   *** General:  No chills, fever, night sweats or weight changes.  Cardiovascular:  No chest pain, dyspnea on exertion, edema, orthopnea, palpitations, paroxysmal nocturnal dyspnea. Dermatological: No rash, lesions/masses Respiratory: No cough, dyspnea Urologic: No hematuria, dysuria Abdominal:   No nausea,  vomiting, diarrhea, bright red blood per rectum, melena, or hematemesis Neurologic:  No visual changes, wkns, changes in mental status. All other systems reviewed  and are otherwise negative except as noted above.     Physical Exam    VS:  There were no vitals taken for this visit. , BMI There is no height or weight on file to calculate BMI.     GEN: Well nourished, well developed, in no acute distress. HEENT: normal. Neck: Supple, no JVD, carotid bruits, or masses. Cardiac: RRR, no murmurs, rubs, or gallops. No clubbing, cyanosis, edema.  Radials/DP/PT 2+ and equal bilaterally.  Respiratory:  Respirations regular and unlabored, clear to auscultation bilaterally. GI: Soft, nontender, nondistended, BS + x 4. MS: no deformity or atrophy. Skin: warm and dry, no rash. Neuro:  Strength and sensation are intact. Psych: Normal affect.  Accessory Clinical Findings    ECG personally reviewed by me today- *** - No acute changes  2D Echo on 12/09/20:   1. Left ventricular ejection fraction, by estimation, is 45 to 50%. The left ventricle has mildly decreased function. The left ventricle demonstrates global hypokinesis. The left ventricular internal cavity size was mildly dilated. There is mild left ventricular hypertrophy. Left ventricular diastolic parameters are consistent with Grade I diastolic dysfunction (impaired relaxation). 2. Right ventricular systolic function is normal. The right ventricular size is normal. 3. The mitral valve is normal in structure. No evidence of mitral valve  regurgitation. No evidence of mitral stenosis. 4. The aortic valve is normal in structure. There is mild calcification  of the aortic valve. There is mild thickening of the aortic valve. Aortic valve regurgitation is not visualized. Mild aortic valve sclerosis is present, with no evidence of aortic  valve stenosis. 5. The inferior vena cava is normal in size with greater than 50% respiratory variability,  suggesting right atrial pressure of 3 mmHg.   Vascular US ABI with/wo TBI: 08/12/2016: Unable to obtain reliable bilateral ABIs (1.5 bilaterally) due to a lack of correlation between Doppler waveforms and ankle pressures which is most likely due to medial calcification. Triphasic and biphasic waveforms noted bilaterally. Bilateral toe-brachia indices are normal.   Vascular US lower extremity venous bilateral (DVT): 04/20/16  No evidence of deep vein thrombosis involving the visualized veins of the right lower extremity. No evidence of deep vein thrombosis involving the visualized veins of the left lower extremity. No evidence of Baker&'s cyst on the right or left.   Vascular US ABI with/wo TBI: 01/29/2016 No evidence of significant right lower extremity arterial disease based on the right ABI (1.33 using the PTA), may be falsly elevated. Brisk monophasic dorsalis pedis. No evidence of significant left lower extremity arterial disease based on the left ABI (1.14). Bilateral toe-brachial indices are abnormal.   Lexiscan Stress Test (01/25/2016): Nuclear stress EF: 58%. There was no ST segment deviation noted during stress. There is a large defect of severe severity present in the basal inferior, mid inferior, apical septal, apical inferior and apex location. The defect is non-reversible. No ischemia noted. Tthis is consistent with diaphragmatic attenuation artifact vs. Scar. This is a low risk study. The left ventricular ejection fraction is normal (55-65%).  CT Chest on 11/14/2015: 1. Mild patchy upper lung predominant centrilobular ground-glass micronodularity, slightly increased since 2014. Mild patchy air trapping. Per EPIC, the patient is a former smoker. As such, these findings raise the possibility of subacute hypersensitivity pneumonitis. 2. Scattered subpleural pulmonary nodules are all stable since 2014 and considered benign . 3. Aortic atherosclerosis. Ascending thoracic 4.2 cm  aortic aneurysm, slightly increased since 2014. Recommend annual imaging followup by CTA or MRA. This recommendation  follows 2010 ACCF/AHA/AATS/ACR/ASA/SCA/SCAI/SIR/STS/SVM Guidelines for the Diagnosis and Management of Patients with Thoracic Aortic Disease. Circulation. 2010; 121: M468-E321. 4. Left main and 3 vessel coronary atherosclerosis.   Left Heart Cath on 06/11/2014: 1. Two-vessel coronary artery disease with continued patency of the stented segments in the right coronary artery and severe stenoses of the mid and apical LAD 2. Mild diffuse nonobstructive left circumflex stenosis 3. Successful PCI of the LAD using 2 drug-eluting stents  Recommendations:  Dual antiplatelet therapy for at least 6 months. Would be reasonable to consider interruption of aspirin and Plavix at 6 months for colostomy reversal.   Lower extremity arterial doppler bilateral (09/03/2013): Unable to visualize distal abdominal aorta/common iliac arteries due to body habitus. Occlusive disease seen along bilateral anterior tibial. A mildly abnormal lower extremity arterial duplex Doppler evaluation.  Lower extremity Venous duplex bilateral (02/24/2013) No obvious evidence of deep vein or superficial thrombosis involving the right lower extremity and left lower extremity. No evidence of Baker's cyst on the right or left.   Lab Results  Component Value Date   WBC 6.2 11/23/2020   HGB 13.0 11/23/2020   HCT 39.1 11/23/2020   MCV 87 11/23/2020   PLT 176 11/23/2020   Lab Results  Component Value Date   CREATININE 2.33 (H) 11/23/2020   BUN 23 11/23/2020   NA 140 11/23/2020   K 4.1 11/23/2020   CL 102 11/23/2020   CO2 19 (L) 11/23/2020   Lab Results  Component Value Date   ALT 20 11/23/2020   AST 16 11/23/2020   ALKPHOS 147 (H) 11/23/2020   BILITOT 1.1 11/23/2020   Lab Results  Component Value Date   CHOL 148 11/23/2020   HDL 53 11/23/2020   LDLCALC 71 11/23/2020   TRIG 140 11/23/2020   CHOLHDL 2.8  11/23/2020    Lab Results  Component Value Date   HGBA1C 5.9 (H) 05/14/2016    Assessment & Plan   1.  *** Paroxysmal atrial tachycardia  2. CAD  3. HTN  4. HLD   5. Morbid obesity  6. Chronic edema 7. Ischemic CM - losartan?? 8. Chronic CHF   9. Stage 3a CKD  Thoracic aortic aneurysm *** severe anxiety and shob with last CT scan     Finis Bud, NP 08/29/2021, 8:19 PM

## 2021-08-30 ENCOUNTER — Ambulatory Visit (INDEPENDENT_AMBULATORY_CARE_PROVIDER_SITE_OTHER): Payer: Medicare Other | Admitting: Nurse Practitioner

## 2021-08-30 ENCOUNTER — Encounter: Payer: Self-pay | Admitting: Nurse Practitioner

## 2021-08-30 ENCOUNTER — Ambulatory Visit (INDEPENDENT_AMBULATORY_CARE_PROVIDER_SITE_OTHER): Payer: Medicare Other

## 2021-08-30 VITALS — BP 102/60 | HR 104 | Ht 71.0 in | Wt 358.0 lb

## 2021-08-30 DIAGNOSIS — E785 Hyperlipidemia, unspecified: Secondary | ICD-10-CM | POA: Diagnosis not present

## 2021-08-30 DIAGNOSIS — I2510 Atherosclerotic heart disease of native coronary artery without angina pectoris: Secondary | ICD-10-CM

## 2021-08-30 DIAGNOSIS — I491 Atrial premature depolarization: Secondary | ICD-10-CM

## 2021-08-30 DIAGNOSIS — R609 Edema, unspecified: Secondary | ICD-10-CM

## 2021-08-30 DIAGNOSIS — I471 Supraventricular tachycardia: Secondary | ICD-10-CM | POA: Diagnosis not present

## 2021-08-30 DIAGNOSIS — I493 Ventricular premature depolarization: Secondary | ICD-10-CM

## 2021-08-30 DIAGNOSIS — N1831 Chronic kidney disease, stage 3a: Secondary | ICD-10-CM

## 2021-08-30 DIAGNOSIS — I5042 Chronic combined systolic (congestive) and diastolic (congestive) heart failure: Secondary | ICD-10-CM | POA: Diagnosis not present

## 2021-08-30 DIAGNOSIS — I259 Chronic ischemic heart disease, unspecified: Secondary | ICD-10-CM

## 2021-08-30 DIAGNOSIS — I10 Essential (primary) hypertension: Secondary | ICD-10-CM

## 2021-08-30 DIAGNOSIS — I77819 Aortic ectasia, unspecified site: Secondary | ICD-10-CM

## 2021-08-30 DIAGNOSIS — I4719 Other supraventricular tachycardia: Secondary | ICD-10-CM

## 2021-08-30 DIAGNOSIS — I255 Ischemic cardiomyopathy: Secondary | ICD-10-CM

## 2021-08-30 NOTE — Patient Instructions (Addendum)
Medication Instructions:  Your physician recommends that you continue on your current medications as directed. Please refer to the Current Medication list given to you today. *If you need a refill on your cardiac medications before your next appointment, please call your pharmacy*   Lab Work: TODAY-BMET, MAG, TSH If you have labs (blood work) drawn today and your tests are completely normal, you will receive your results only by: [place] (if you have MyChart) OR A paper copy in the mail If you have any lab test that is abnormal or we need to change your treatment, we will call you to review the results.   Testing/Procedures: Your physician has requested that you have an echocardiogram. Echocardiography is a painless test that uses sound waves to create images of your heart. It provides your doctor with information about the size and shape of your heart and how well your heart's chambers and valves are working. This procedure takes approximately one hour. There are no restrictions for this procedure. SCHEDULE ECHO FOR OCTOBER 2023  ZIO XT- Long Term Monitor Instructions  Your physician has requested you wear a ZIO patch monitor for 14 days.  This is a single patch monitor. [employer] supplies one patch monitor per enrollment. Additional stickers are not available. Please do not apply patch if you will be having a Nuclear Stress Test,  Echocardiogram, Cardiac CT, MRI, or Chest Xray during the period you would be wearing the  monitor. The patch cannot be worn during these tests. You cannot remove and re-apply the  ZIO XT patch monitor.  Your ZIO patch monitor will be mailed 3 day USPS to your address on file. It may take 3-5 days  to receive your monitor after you have been enrolled.  Once you have received your monitor, please review the enclosed instructions. Your monitor  has already been registered assigning a specific monitor serial # to you.  Billing and Patient Assistance  Program Information  We have supplied [employer] with any of your insurance information on file for billing purposes. [employer] offers a sliding scale Patient Assistance Program for patients that do not have  insurance, or whose insurance does not completely cover the cost of the ZIO monitor.  You must apply for the Patient Assistance Program to qualify for this discounted rate.  To apply, please call [employer] at [phone], select option 4, select option 2, ask to apply for  Patient Assistance Program. Theodore Demark will ask your household income, and how many people  are in your household. They will quote your out-of-pocket cost based on that information.  [employer] will also be able to set up a 97-month interest-free payment plan if needed.  Applying the monitor   Shave hair from upper left chest.  Hold abrader disc by orange tab. Rub abrader in 40 strokes over the upper left chest as  indicated in your monitor instructions.  Clean area with 4 enclosed alcohol pads. Let dry.  Apply patch as indicated in monitor instructions. Patch will be placed under collarbone on left  side of chest with arrow pointing upward.  Rub patch adhesive wings for 2 minutes. Remove white label marked "1". Remove the white  label marked "2". Rub patch adhesive wings for 2 additional minutes.  While looking in a mirror, press and release button in center of patch. A small green light will  flash 3-4 times. This will be your only indicator that the monitor has been turned on.  Do not shower for the first 24 hours.  You may shower after the first 24 hours.  Press the button if you feel a symptom. You will hear a small click. Record Date, Time and  Symptom in the Patient Logbook.  When you are ready to remove the patch, follow instructions on the last 2 pages of Patient  Logbook. Stick patch monitor onto the last page of Patient Logbook.  Place Patient Logbook in the blue and white box. Use locking tab on box and tape box  closed  securely. The blue and white box has prepaid postage on it. Please place it in the mailbox as  soon as possible. Your physician should have your test results approximately 7 days after the  monitor has been mailed back to [facility].  Call [place] at [phone] if you have questions regarding  your ZIO XT patch monitor. Call them immediately if you see an orange light blinking on your  monitor.  If your monitor falls off in less than 4 days, contact our Monitor department at [phone].  If your monitor becomes loose or falls off after 4 days call [employer] at [phone] for  suggestions on securing your monitor   Follow-Up: At [facility], you and your health needs are our priority.  As part of our continuing mission to provide you with exceptional heart care, we have created designated Provider Care Teams.  These Care Teams include your primary Cardiologist (physician) and Advanced Practice Providers (APPs -  Physician Assistants and Nurse Practitioners) who all work together to provide you with the care you need, when you need it.  We recommend signing up for the patient portal called "MyChart".  Sign up information is provided on this After Visit Summary.  MyChart is used to connect with patients for Virtual Visits (Telemedicine).  Patients are able to view lab/test results, encounter notes, upcoming appointments, etc.  Non-urgent messages can be sent to your provider as well.   To learn more about what you can do with MyChart, go to [email or web address].    Your next appointment:   6 month(s)  The format for your next appointment:   In Person  Provider:   Lenna Sciara, MD    Other Instructions   Important Information About Sugar

## 2021-08-30 NOTE — Progress Notes (Unsigned)
ZIO XT [id number] from office inventory applied to patient.  Dr. Ali Lowe to read.

## 2021-08-31 LAB — BASIC METABOLIC PANEL
BUN/Creatinine Ratio: 12 (ref 10–24)
BUN: 30 mg/dL — ABNORMAL HIGH (ref 8–27)
CO2: 19 mmol/L — ABNORMAL LOW (ref 20–29)
Calcium: 10.2 mg/dL (ref 8.6–10.2)
Chloride: 105 mmol/L (ref 96–106)
Creatinine, Ser: 2.6 mg/dL — ABNORMAL HIGH (ref 0.76–1.27)
Glucose: 95 mg/dL (ref 70–99)
Potassium: 4.2 mmol/L (ref 3.5–5.2)
Sodium: 143 mmol/L (ref 134–144)
eGFR: 26 mL/min/{1.73_m2} — ABNORMAL LOW (ref >59)

## 2021-08-31 LAB — MAGNESIUM: Magnesium: 2.5 mg/dL — ABNORMAL HIGH (ref 1.6–2.3)

## 2021-08-31 LAB — TSH: TSH: 1.38 u[IU]/mL (ref 0.450–4.500)

## 2021-09-21 NOTE — Progress Notes (Signed)
0   Cardiology Office Note Date:  09/21/2021  Patient ID:  Larry [name], Larry [name] 1950/10/20, MRN [id number] PCP:  Lin Landsman, MD  Cardiologist:/Electrophysiologist: L. Servando Snare, NP/ Dr. Rayann Heman Nephrologist: Larry [name] can not recall name, is in [facility]   Chief Complaint:  PVCs  History of Present Illness: Larry [name] is a 71 y.o. male with history of CAD (PCI RCA 2010, LAD 2016), ICM w/recovered LVEF, chronic CHF (combined, morbid obesity, chronic LE edema (described as massive), ATach, OSA w/CPAP, colon ca (surgery complicated by prolonged QT and torsades in setting of profound electrolyte abnormality). HTN, HLD, CKD (IIIa) 2018 attempt at ileostomy reversal complicated with anastomosis leak, respiratory failure and prolonged stay at [facility].  His LE is described as chronic and massive, CCB continued given his ATach, in the past with recurrence of tachycardia when attepts to reduce dose.  Described as basically home bound it seems 2/2 weight, edema, follows at wound clinic.  Larry [name] comes in today to be seen for Dr. Rayann Heman, last seen by him back in 2019, following routinely with Tommas Olp, NP who has since retired. She last saw him Nov 2021, Larry [name] was doing generally OK from a cardiac standpoint.  I saw him 04/28/20 Larry [name] is doing well. I observed him walking in, looked sturdy and ambulating without difficulty. Larry [name] tells me Larry [name] stays home to stay away from [place] mostly Gets out to shop etc. Larry [name] does not exercise, but denies any symptoms with his ADLs, no CP, palpitations.  Denies SOB or DOE< denies symptoms of orthopnea or PND. No dizzy spells, near syncope or syncope.  Follows with his PMD and nephrology Follows with wound clinic for abdominal wound that Larry [name] has been dealing with now for years, though is stable and doing well. Marked edema described as unchanged for years and diuretics managed with PMD, nephrology  From a CAD/ATach perspective was doing wll, no changes were made,  planned for 39movisit with labs  I saw him 11/23/2020 Again, cardiac-wise Larry [name] feels like Larry [name] is doing very well. No CP, palpitations or cardiac awareness. No dizzy spells, near syncope or syncope Larry [name] is largely very sedentary, stays in, only getting out for groceries and doctor visits No rest SOB, no PND, orthopnea, uses CPAP nightly Larry [name] does get winded with ambulation of longer distances, unchanged for years. No difficulties with his ADLs At this visit, discussed tat Larry [name] may be better served if followed by one of our general/interventional cardiologists rather then solely EP, Larry [name] was agreeable and referred to Dr. TAli Lowe Larry [name] saw E. PArlington Calix NP 08/30/21 for a 625moisit, Larry [name] was tired, perhaps not using his CPAP regularly, getting around OK.  EKG noted frequent PVCs and some PACs, without symptoms (noted historically as well back in 2020) Planned for labs and monitoring to assess burden  Monitor noted a PVC burden of 12.5% Echo is ordered/pending K+ 4.2 Mag 2.5 Creat 2.60 TSH 1.380  TODAY Larry [name] is doing well. Denies any kind of CP, palpitations or cardiac awareness No SOB or difficulties with his ADLs Is pretty sedentary, by choice. No near syncope or syncope.   Past Medical History:  Diagnosis Date   Arthritis    Atrial tachycardia ([facility]   CAD in native artery    Childhood asthma    "went away after I was 16"   Chronic combined systolic and diastolic CHF, NYHA class 3 (HCC)    CKD (chronic kidney disease) stage 3, GFR 30-59 ml/min (HCC)  Colon cancer ([place]) 2015   MSI high; IHC loss of MLH1 and PMS2; BRAF negative; Negative methylation   COPD (chronic obstructive pulmonary disease) (HCC)    Coronary artery disease    Family history of ovarian cancer    Hypercholesteremia    Hypertension    Noncompliance    NSVT (nonsustained ventricular tachycardia) (HCC)    Obesity    OSA on CPAP    used nightly, pt does not know settings   S/P colostomy ([place])    2014   Thoracic aortic  aneurysm (HCC)    4.2 cm thoracic aortic aneurysm per chest ct 11-14-15 epic   Thrombocytopenia ([place])    Torsades de pointes ([place])    X 2 episodes during hospital visit 12'14"electrolyte imbalance"- "Shocked"    Past Surgical History:  Procedure Laterality Date   COLON SURGERY     COLONOSCOPY N/A 02/08/2013   Procedure: COLONOSCOPY;  Surgeon: [name], MD;  Location: [facility];  Service: Endoscopy;  Laterality: N/A;   COLONOSCOPY WITH PROPOFOL N/A 05/09/2014   Procedure: COLONOSCOPY WITH PROPOFOL;  Surgeon: [name], MD;  Location: [facility];  Service: Endoscopy;  Laterality: N/A;   COLONOSCOPY WITH PROPOFOL N/A 01/08/2016   Procedure: COLONOSCOPY WITH PROPOFOL;  Surgeon: [name], MD;  [facility];  Service: Endoscopy;  Laterality: N/A;   COLONOSCOPY WITH PROPOFOL N/A 12/28/2018   Procedure: COLONOSCOPY WITH PROPOFOL;  Surgeon: [name], MD;  [facility];  Service: Endoscopy;  Laterality: N/A;   COLOSTOMY N/A 04/19/2016   Procedure: COLOSTOMY;  Surgeon: Judeth Horn, MD;  Location: [facility];  Service: General;  Laterality: N/A;   COLOSTOMY REVERSAL  04/12/2016   COLOSTOMY REVERSAL N/A 04/12/2016   Procedure: COLOSTOMY REVERSAL;  Surgeon: Judeth Horn, MD;  Location: [facility];  Service: General;  Laterality: N/A;   CORONARY ANGIOPLASTY WITH STENT PLACEMENT  11/12/2008; 06/11/2014   stent x 2 to RCA; stent x 2 to LAD   CORONARY ANGIOPLASTY WITH STENT PLACEMENT  06/11/2014   m-LAD 3.5 x 16 mm Synergy DES, d-LAD  2.25 x 16 mm Synergy DES   CYSTOSCOPY W/ URETERAL STENT PLACEMENT Left 06/30/2017   Procedure: CYSTOSCOPY WITH RETROGRADE PYELOGRAM/URETERAL STENT PLACEMENT;  Surgeon: Lucas Mallow, MD;  Location: [facility];  Service: Urology;  Laterality: Left;   CYSTOSCOPY WITH LITHOLAPAXY N/A 08/02/2017   Procedure: CYSTOSCOPY WITH BLADDER STONE EXTRACTION;  Surgeon: Lucas Mallow, MD;  [facility];  Service: Urology;  Laterality: N/A;   CYSTOSCOPY  WITH RETROGRADE PYELOGRAM, URETEROSCOPY AND STENT PLACEMENT Left 08/02/2017   Procedure: CYSTOSCOPY WITH LEFT RETROGRADE PYELOGRAM, URETEROSCOPY AND STENT EXCHANGE;  Surgeon: Lucas Mallow, MD;  [facility];  Service: Urology;  Laterality: Left;   ESOPHAGOGASTRODUODENOSCOPY N/A 02/08/2013   Procedure: ESOPHAGOGASTRODUODENOSCOPY (EGD);  Surgeon: [name], MD;  Location: [facility];  Service: Endoscopy;  Laterality: N/A;   FLEXIBLE SIGMOIDOSCOPY N/A 02/19/2016   Procedure: FLEXIBLE SIGMOIDOSCOPY;  Surgeon: [name], MD;  [facility];  Service: Endoscopy;  Laterality: N/A;   HOLMIUM LASER APPLICATION Left 7/61/6073   Procedure: HOLMIUM LASER APPLICATION;  Surgeon: Lucas Mallow, MD;  [facility];  Service: Urology;  Laterality: Left;   LAPAROTOMY N/A 02/12/2013   Procedure: EXPLORATORY LAPAROTOMY PARTIAL COLECTOMY WITH COLOSTOMY;  Surgeon: [name], MD;  Location: [facility];  Service: General;  Laterality: N/A;   LAPAROTOMY N/A 02/18/2013   Procedure: EXPLORATORY LAPAROTOMY/Closure of Wound;  Surgeon: [name], MD;  Location: [facility];  Service: General;  Laterality: N/A;   LAPAROTOMY N/A 04/19/2016   Procedure: EXPLORATORY LAPAROTOMY, REPAIR OF ANASTAMOTIC LEAK;  Surgeon: Judeth Horn, MD;  Location: [facility];  Service: General;  Laterality: N/A;   LEFT HEART CATHETERIZATION WITH CORONARY ANGIOGRAM N/A 06/11/2014   Procedure: LEFT HEART CATHETERIZATION WITH CORONARY ANGIOGRAM;  Surgeon: [name], MD; CFX calcified, 30-40 percent, RCA calcified, 40/50/40%, PDA diffuse disease, LAD 40/75/90% s/p DES 2    POLYPECTOMY  12/28/2018   Procedure: POLYPECTOMY;  Surgeon: [name], MD;  [facility];  Service: Endoscopy;;    Current Outpatient Medications  Medication Sig Dispense Refill   aspirin EC 81 MG tablet Take 1 tablet (81 mg total) by mouth daily. 30 tablet 0   atorvastatin (LIPITOR) 40 MG tablet TAKE 1 TABLET BY MOUTH EVERY DAY 90  tablet 1   diltiazem (CARDIZEM) 60 MG tablet TAKE 1 & 1/2 TABLET BY MOUTH 3 TIMES DAILY. 405 tablet 3   empagliflozin (JARDIANCE) 10 MG TABS tablet Take 1 tablet (10 mg total) by mouth daily before breakfast. 90 tablet 3   ferrous sulfate 325 (65 FE) MG tablet Take 1 tablet (325 mg total) by mouth daily with breakfast. 30 tablet 1   furosemide (LASIX) 40 MG tablet TAKE 1.5 TABLETS BY MOUTH DAILY. 135 tablet 3   levalbuterol (XOPENEX HFA) 45 MCG/ACT inhaler TAKE 2 PUFFS BY MOUTH EVERY 4 HOURS AS NEEDED FOR WHEEZE 15 each 1   losartan (COZAAR) 25 MG tablet Take 25 mg by mouth daily.     metoprolol tartrate (LOPRESSOR) 25 MG tablet TAKE 1.5 TABLETS (37.5 MG TOTAL) BY MOUTH 2 (TWO) TIMES DAILY. 270 tablet 3   Multiple Vitamin (MULTI-VITAMIN DAILY) TABS Take 1 tablet by mouth daily.     nitroGLYCERIN (NITROSTAT) 0.4 MG SL tablet Place 0.4 mg under the tongue every 5 (five) minutes as needed for chest pain.     simethicone (MYLICON) 195 MG chewable tablet Chew 125 mg by mouth 3 (three) times daily.     spironolactone (ALDACTONE) 25 MG tablet TAKE 1/2 TABLET BY MOUTH EVERY DAY 45 tablet 3   tamsulosin (FLOMAX) 0.4 MG CAPS capsule Take 1 capsule (0.4 mg total) by mouth daily. 90 capsule 3   No current facility-administered medications for this visit.    Allergies:   Patient has no known allergies.   Social History:  The patient  reports that Larry [name] quit smoking about 13 years ago. His smoking use included cigarettes. Larry [name] has a 38.00 pack-year smoking history. Larry [name] has never used smokeless tobacco. Larry [name] reports current alcohol use. Larry [name] reports that Larry [name] does not use drugs.   Family History:  The patient's family history includes Dementia in his mother; Diabetes in his father; Heart disease in his father; Hypertension in his father and mother; Leukemia in his maternal uncle; Liver cancer in his maternal grandmother; Parkinsonism in his mother; Prostate cancer in his maternal uncle.  ROS:  Please see the history of  present illness.    All other systems are reviewed and otherwise negative.   PHYSICAL EXAM:  VS:  There were no vitals taken for this visit. BMI: There is no height or weight on file to calculate BMI. Well nourished, well developed, in no acute distress HEENT: normocephalic, atraumatic Neck: no JVD, carotid bruits or masses Cardiac:  RRR; a couple extrasystoles heard soft SM, no rubs, or gallops Lungs:  CTA b/l, no wheezing, rhonchi or rales Abd: soft, nontender, ileostomy, has a dressing mid-line  MS: no deformity or atrophy Ext: marked edema, chronic looking and very dry skin/flaking/peeling changes, Larry [name] reports his edema at his baseline without change for years Skin: warm and dry, no rash Neuro:  No gross deficits appreciated Psych: euthymic mood, full affect    EKG:  Done today and reviewed by myself shows  SRT 103bpm, PVCs one couplet   July 2023: 3 day monitor Patient had a min HR of 60 bpm, max HR of 150 bpm, and avg HR of 78 bpm. Predominant underlying rhythm was Sinus Rhythm. First Degree AV Block was present.  EVENTS: 3 Supraventricular Tachycardia runs occurred, the run with the fastest interval lasting 4 beats with a max rate of 150 bpm, the longest lasting 5 beats with an avg rate of 106 bpm.  Isolated SVEs were occasional (1.9%, 5498), SVE Couplets were rare (<1.0%, 244), and SVE Triplets were rare (<1.0%, 34).  Isolated VEs were frequent (12.5%, [id number]), VE Couplets were rare (<1.0%, 1261), and VE Triplets were rare (<1.0%, 76). Ventricular Bigeminy and Trigeminy were present. No atrial fibrillation, ventricular tachyarrhythmias, or bradyarrhythmias were detected. Patient triggered events corresponded with sinus rhythm, PVCs, and PACs.  12/09/2020  1. Left ventricular ejection fraction, by estimation, is 45 to 50%. The  left ventricle has mildly decreased function. The left ventricle  demonstrates global hypokinesis. The left ventricular internal cavity size  was  mildly dilated. There is mild left  ventricular hypertrophy. Left ventricular diastolic parameters are  consistent with Grade I diastolic dysfunction (impaired relaxation).   2. Right ventricular systolic function is normal. The right ventricular  size is normal.   3. The mitral valve is normal in structure. No evidence of mitral valve  regurgitation. No evidence of mitral stenosis.   4. The aortic valve is normal in structure. There is mild calcification  of the aortic valve. There is mild thickening of the aortic valve. Aortic  valve regurgitation is not visualized. Mild aortic valve sclerosis is  present, with no evidence of aortic  valve stenosis.   5. The inferior vena cava is normal in size with greater than 50%  respiratory variability, suggesting right atrial pressure of 3 mmHg.   Echo Study Conclusions 03/2016 - Left ventricle: The cavity size was normal. Wall thickness was   increased in a pattern of moderate LVH. Systolic function was   normal. The estimated ejection fraction was in the range of 55%   to 60%. Wall motion was normal; there were no regional wall   motion abnormalities. The study is not technically sufficient to   allow evaluation of LV diastolic function. - Aortic root: The aortic root was mildly dilated. - Mitral valve: Calcified annulus.   Impressions:   - Technically difficult; definity used; normal LV function;   moderate LVH.     MyoviewStudy Highlights 01/2016 Nuclear stress EF: 58%. There was no ST segment deviation noted during stress. There is a large defect of severe severity present in the basal inferior, mid inferior, apical septal, apical inferior and apex location. The defect is non-reversible. No ischemia noted. Tthis is consistent with diaphragmatic attenuation artifact vs. Scar. This is a low risk study. The left ventricular ejection fraction is normal (55-65%).         Procedure: Left Heart Cath, [facility] Coronary Angiography, PTCA  and stenting of the mid and distal LAD April 2016.              Coronary angiography: Left mainstem: The left mainstem is patent. The vessel  divides into the LAD and left circumflex. There is no significant stenosis, but there is mild irregularity.   Left anterior descending (LAD): The LAD is moderately calcified. The vessel is severely diseased. The proximal LAD is patent with 30-40% stenosis after the second septal perforator. There is diffuse calcification. The diagonal branches are small. The mid LAD has an eccentric 75% stenosis at the origin of the second diagonal branch. Beyond that area the mid and distal LAD are diffusely diseased. There is another severe stenosis in the apical LAD of 90%.   Left circumflex (LCx): The left circumflex is calcified. The vessel is patent with mild irregularity. There are no high-grade stenoses identified. There are scattered 30-40% stenoses throughout the proximal and mid circumflex as well as the first OM branch.   Right coronary artery (RCA): The RCA is dominant. The vessel is heavily calcified. The stented segments in the mid and distal RCA are patent. The proximal vessel has 30-40% stenosis. The mid vessel has 30-40% stenosis. The distal vessel is patent with 50-60% stenosis involving the origin of the PDA. The PDA is diffusely diseased.   Left ventriculography: Deferred because of chronic kidney disease. By nuclear scan the LVEF is 41%.   PCI Note:  Following the diagnostic procedure, the decision was made to proceed with PCI of the mid and apical LAD.  The right coronary artery had patent stents in the left circumflex had nonobstructive disease. The LAD is a diffusely diseased vessel and the mid and distal vessel would be a poor surgical targets. I felt that PCI was the best option in this patient with a high risk nuclear scan demonstrating anteroapical and anterolateral ischemia. Larry [name] will require colostomy reversal, so I planned on treating him with Synergy  drug-eluting stents which had a biodegradable polymer and potentially can allow for earlier interruption of dual antiplatelet therapy. The patient was loaded with Plavix 600 mg. Weight-based bivalirudin was given for anticoagulation. Once a therapeutic ACT was achieved, a 6 [place] XB LAD 3.5 cm guide catheter was inserted.  A cougar coronary guidewire was used to cross the lesion in the apical LAD.  The lesion was predilated with a 2.5 x 12 mm balloon.  The same balloon was used to dilate the mid lesion. The apical lesion was then stented with a 2.25 x 16 mm Synergy DES.  The stent was postdilated with a 2.5 mm noncompliant balloon.  The mid lesion was then stented with a 3.5 x 16 mm Synergy DES. That stent was postdilated with a 3.75 mm noncompliant balloon. Following PCI, there was 0% residual stenosis and TIMI-3 flow. Final angiography confirmed an excellent result. The patient tolerated the procedure well. There were no immediate procedural complications. A TR band was used for radial hemostasis. The patient was transferred to the post catheterization recovery area for further monitoring.   PCI Data: Lesion 1: Vessel - LAD/Segment - distal/apical Percent Stenosis (pre)  90 TIMI-flow 3 Stent 2.25 x 16 mm Synergy DES Percent Stenosis (post) 0 TIMI-flow (post) 3   Lesion 2: Vessel - LAD/Segment - mid Percent Stenosis (pre)  75 TIMI-flow 3 Stent 3.5 x 16 mm Synergy DES Percent Stenosis (post) 0 TIMI-flow (post) 3   Estimated Blood Loss: Minimal   Final Conclusions:    1. Two-vessel coronary artery disease with continued patency of the stented segments in the right coronary artery and severe stenoses of the mid and apical LAD 2. Mild diffuse nonobstructive left circumflex stenosis 3. Successful PCI of the  LAD using 2 drug-eluting stents    Recommendations:  Dual antiplatelet therapy for at least 6 months. Would be reasonable to consider interruption of aspirin and Plavix at 6 months for  colostomy reversal.     Recent Labs: 11/23/2020: ALT 20; Hemoglobin 13.0; Platelets 176 08/30/2021: BUN 30; Creatinine, Ser 2.60; Magnesium 2.5; Potassium 4.2; Sodium 143; TSH 1.380  11/23/2020: Chol/HDL Ratio 2.8; Cholesterol, Total 148; HDL 53; LDL Chol Calc (NIH) 71; Triglycerides 140   CrCl cannot be calculated (Patient's most recent lab result is older than the maximum 21 days allowed.).   Wt Readings from Last 3 Encounters:  08/30/21 (!) 358 lb (162.4 kg)  04/13/21 (!) 358 lb 9.6 oz (162.7 kg)  02/25/21 (!) 364 lb 12.8 oz (165.5 kg)     Other studies reviewed: Additional studies/records reviewed today include: summarized above  ASSESSMENT AND PLAN:  1. Atach     Not an ablation candidate     No palpitations     No sustained SVTs on his monitor     Continue CCB, BB  2. CAD     no symptoms of angina     on ASA, BB, statin            3. HTN     Looks good     Has some room if we need to titrate meds  4. HLD     Not addressed today   5. Morbid obesity 6. Chronic marked edema 7. ICM w/recovered LVEF, diastolic dysfunction     Follows PMD and nephrology     Reports at his baseline unchanged for years     No changes, defer diuretics to his PMD and nephrologist  8. PVCs Asymptomatic No anginal symptoms. Burden alone not particularly worrisome If his echo/EF looks OK, would not get too aggressive with his PVCs, though we can increase his metoprolol if his EF remains a little depressed/looks worse   Disposition: pending his echo, will see if we can't get that moved up, needs a new EP MD, will have him establish with Dr. Quentin Ore in about 4 mo, sooner if needed    Current medicines are reviewed at length with the patient today.  The patient did not have any concerns regarding medicines.  Venetia Night, PA-C 09/21/2021 3:45 PM     [address] [place] [address] [place] [address] [phone] (office)  [phone] (fax)

## 2021-09-27 ENCOUNTER — Ambulatory Visit (INDEPENDENT_AMBULATORY_CARE_PROVIDER_SITE_OTHER): Payer: Medicare Other | Admitting: Physician Assistant

## 2021-09-27 ENCOUNTER — Other Ambulatory Visit: Payer: Self-pay | Admitting: Physician Assistant

## 2021-09-27 ENCOUNTER — Encounter: Payer: Self-pay | Admitting: Physician Assistant

## 2021-09-27 ENCOUNTER — Other Ambulatory Visit: Payer: Self-pay | Admitting: Internal Medicine

## 2021-09-27 VITALS — BP 122/64 | HR 103 | Ht 71.0 in | Wt 352.4 lb

## 2021-09-27 DIAGNOSIS — I2510 Atherosclerotic heart disease of native coronary artery without angina pectoris: Secondary | ICD-10-CM | POA: Diagnosis not present

## 2021-09-27 DIAGNOSIS — I259 Chronic ischemic heart disease, unspecified: Secondary | ICD-10-CM | POA: Diagnosis not present

## 2021-09-27 DIAGNOSIS — I471 Supraventricular tachycardia: Secondary | ICD-10-CM

## 2021-09-27 DIAGNOSIS — I10 Essential (primary) hypertension: Secondary | ICD-10-CM

## 2021-09-27 DIAGNOSIS — I493 Ventricular premature depolarization: Secondary | ICD-10-CM

## 2021-09-27 NOTE — Addendum Note (Signed)
Addended by: Claude Manges on: 09/27/2021 11:21 AM   Modules accepted: Orders

## 2021-09-27 NOTE — Patient Instructions (Addendum)
   Medication Instructions:   Your physician recommends that you continue on your current medications as directed. Please refer to the Current Medication list given to you today.   *If you need a refill on your cardiac medications before your next appointment, please call your pharmacy*   Lab Work:  [place]   If you have labs (blood work) drawn today and your tests are completely normal, you will receive your results only by: [place] (if you have MyChart) OR A paper copy in the mail If you have any lab test that is abnormal or we need to change your treatment, we will call you to review the results.   Testing/Procedures:  TO BE A FEW   WEEKS EARLIER THAT SCHEDULED  CURRENT ECHOCARDIOGRAM  PER [place] IF POSSIBLE.Your physician has requested that you have an echocardiogram. Echocardiography is a painless test that uses sound waves to create images of your heart. It provides your doctor with information about the size and shape of your heart and how well your heart's chambers and valves are working. This procedure takes approximately one hour. There are no restrictions for this procedure.      Follow-Up: At [facility], you and your health needs are our priority.  As part of our continuing mission to provide you with exceptional heart care, we have created designated Provider Care Teams.  These Care Teams include your primary Cardiologist (physician) and Advanced Practice Providers (APPs -  Physician Assistants and Nurse Practitioners) who all work together to provide you with the care you need, when you need it.  We recommend signing up for the patient portal called "MyChart".  Sign up information is provided on this After Visit Summary.  MyChart is used to connect with patients for Virtual Visits (Telemedicine).  Patients are able to view lab/test results, encounter notes, upcoming appointments, etc.  Non-urgent messages can be sent to your provider as well.   To learn  more about what you can do with MyChart, go to [email or web address].    Your next appointment:   4 month(s)  The format for your next appointment:   In Person  Provider:   Lars Mage, MD ONLY     Other Instructions   Important Information About Sugar

## 2021-10-21 ENCOUNTER — Other Ambulatory Visit: Payer: Self-pay | Admitting: Physician Assistant

## 2021-10-21 ENCOUNTER — Other Ambulatory Visit: Payer: Self-pay | Admitting: Internal Medicine

## 2021-10-21 NOTE — Telephone Encounter (Signed)
Pt's pharmacy is requesting a refill on tamsulosin. Would Tommye Standard, PA like to refill this medication? Please address

## 2021-11-03 ENCOUNTER — Ambulatory Visit (HOSPITAL_COMMUNITY): Payer: Medicare Other | Attending: Nurse Practitioner

## 2021-11-03 DIAGNOSIS — I471 Supraventricular tachycardia: Secondary | ICD-10-CM

## 2021-11-03 DIAGNOSIS — E785 Hyperlipidemia, unspecified: Secondary | ICD-10-CM | POA: Diagnosis present

## 2021-11-03 DIAGNOSIS — I5042 Chronic combined systolic (congestive) and diastolic (congestive) heart failure: Secondary | ICD-10-CM | POA: Diagnosis present

## 2021-11-03 DIAGNOSIS — R609 Edema, unspecified: Secondary | ICD-10-CM

## 2021-11-03 DIAGNOSIS — I2510 Atherosclerotic heart disease of native coronary artery without angina pectoris: Secondary | ICD-10-CM | POA: Diagnosis present

## 2021-11-03 DIAGNOSIS — I10 Essential (primary) hypertension: Secondary | ICD-10-CM

## 2021-11-03 DIAGNOSIS — N1831 Chronic kidney disease, stage 3a: Secondary | ICD-10-CM | POA: Diagnosis present

## 2021-11-03 DIAGNOSIS — I255 Ischemic cardiomyopathy: Secondary | ICD-10-CM | POA: Diagnosis present

## 2021-11-03 LAB — ECHOCARDIOGRAM COMPLETE
Area-P 1/2: 3.63 cm2
Calc EF: 45.2 %
S' Lateral: 5.5 cm
Single Plane A2C EF: 49.5 %
Single Plane A4C EF: 44.4 %

## 2021-11-03 MED ORDER — PERFLUTREN LIPID MICROSPHERE
1.0000 mL | INTRAVENOUS | Status: AC | PRN
  Administered 2021-11-03: 1 mL via INTRAVENOUS

## 2021-12-01 ENCOUNTER — Other Ambulatory Visit (HOSPITAL_COMMUNITY): Payer: Federal, State, Local not specified - PPO

## 2021-12-05 ENCOUNTER — Other Ambulatory Visit: Payer: Self-pay | Admitting: Physician Assistant

## 2021-12-05 ENCOUNTER — Other Ambulatory Visit: Payer: Self-pay | Admitting: Internal Medicine

## 2021-12-09 ENCOUNTER — Other Ambulatory Visit: Payer: Self-pay | Admitting: Internal Medicine

## 2021-12-16 ENCOUNTER — Other Ambulatory Visit: Payer: Self-pay | Admitting: Internal Medicine

## 2021-12-23 ENCOUNTER — Other Ambulatory Visit: Payer: Self-pay | Admitting: Internal Medicine

## 2021-12-24 MED ORDER — EMPAGLIFLOZIN 10 MG PO TABS: 10.0000 mg | ORAL_TABLET | Freq: Every day | ORAL | 2 refills | Status: DC

## 2022-01-18 ENCOUNTER — Other Ambulatory Visit: Payer: Self-pay | Admitting: Internal Medicine

## 2022-02-02 ENCOUNTER — Ambulatory Visit: Payer: Federal, State, Local not specified - PPO | Admitting: Cardiology

## 2022-02-02 ENCOUNTER — Telehealth: Payer: Self-pay | Admitting: Internal Medicine

## 2022-02-02 NOTE — Telephone Encounter (Signed)
Pt c/o medication issue:  1. Name of Medication:   empagliflozin (JARDIANCE) 10 MG TABS tablet    2. How are you currently taking this medication (dosage and times per day)? Take 1 tablet (10 mg total) by mouth daily before breakfast. - Oral   3. Are you having a reaction (difficulty breathing--STAT)?   4. What is your medication issue? Pt received a letter that his prior authorization will expire in January.

## 2022-02-05 ENCOUNTER — Other Ambulatory Visit: Payer: Self-pay | Admitting: Internal Medicine

## 2022-02-10 NOTE — Telephone Encounter (Signed)
**Note De-Identified  Obfuscation** LONGINO TREFZ (Key: [id number]) Outcome [facility] has indicated that it is too soon to refill this medication at the pharmacy for your patient.  Drug Jardiance '10MG'$  tablets ePA cloud Child psychotherapist Electronic PA Form [phone] NCPDP)

## 2022-02-23 ENCOUNTER — Other Ambulatory Visit: Payer: Self-pay | Admitting: Internal Medicine

## 2022-02-28 NOTE — Progress Notes (Unsigned)
Cardiology Office Note:    Date:  03/01/2022   ID:  Larry [name], Larry [name] 27-Aug-1950, MRN [id number]  PCP:  Lin Landsman, MD   [facility] Providers Cardiologist:  Lenna Sciara, MD Referring MD: Lin Landsman, MD   Chief Complaint/Reason for Referral: Establish general cardiovascular care  ASSESSMENT:    Ischemic cardiomyopathy - Plan: EKG 12-Lead, ECHOCARDIOGRAM COMPLETE  Coronary artery disease involving native heart without angina pectoris, unspecified vessel or lesion type  Hyperlipidemia LDL goal <70  Primary hypertension  Atrial tachycardia  Chronic systolic heart failure ([place])  Stage 3a chronic kidney disease (CKD) ([place])  BMI 50.0-59.9, adult ([place])  COPD mixed type ([place])   PLAN:    In order of problems listed above:  Ischemic cardiomyopathy: Mild decrement in EF.  Relatively sedentary at baseline.  Continue medical therapy with Jardiance, losartan and Toprol.  Stop spironolactone as EF >35%.  Will obtain echocardiogram to evaluate further.  Invasive assessment to characterize his cardiomyopathy was deferred previously due to him being largely sedentary. 2.  Coronary artery disease: Continue aspirin, statin, and beta-blocker. 3.  Hyperlipidemia: Will check lipid panel, LFTs, LP(a) today. 4.  Hypertension: Blood pressure is well-controlled on his current regimen. 5.  Atrial tachycardia: 6.  Heart failure: Looks relatively euvolemic on exam today but does have chronic lower extremity edema. 7.  Stage III chronic kidney disease: Continue losartan and Jardiance. 8.  Elevated BMI: We will refer to pharmacy for recommendations regarding pharmacotherapy. 9.  Aortic atherosclerosis: Continue aspirin, statin, and strict blood pressure control. 10.  COPD: Continue current therapy with as needed albuterol.  This seems to be stable.            Dispo:  Return in about 6 months (around 08/30/2022).     Medication Adjustments/Labs and Tests Ordered: Current  medicines are reviewed at length with the patient today.  Concerns regarding medicines are outlined above.   Tests Ordered: Orders Placed This Encounter  Procedures   EKG 12-Lead   ECHOCARDIOGRAM COMPLETE    Medication Changes: No orders of the defined types were placed in this encounter.   History of Present Illness:    FOCUSED CARDIOVASCULAR PROBLEM LIST:   1.  Coronary artery disease status post PCI of the right coronary and left anterior descending arteries remotely 2.  Cardiomyopathy with ejection fraction of 45 to 50% 3.  Atrial tachycardia 4.  Hypertension 5.  Hyperlipidemia 6.  Chronic kidney disease with a creatinine of around 2 7.  Obstructive sleep apnea on CPAP and COPD 8.  BMI 50 9.  Aortic atherosclerosis on CT scan abdomen pelvis 2019  January 2023 consultation: The patient is a 72 y.o. male with the indicated medical history here to establish general cardiovascular care.  The patient was recently seen by the EP division and was doing relatively well.  He is largely sedentary and is able to do some physical activity but not much.  Plan: Start Jardiance 10 mg daily and consider losartan in the future.  Today: In the interim the patient was seen by EP and was doing fairly well.  An echocardiogram was done which showed an unchanged ejection fraction of 45%.  Monitor was also performed which demonstrated occasional but relatively rare SVT with PACs and PVCs.  He was started on losartan 25 mg daily.  The patient denies any significant symptomatology other than chronic shortness of breath.  He did gain about 10 pounds since I saw him last.  He is interested in pursuing Ozempic  or Wegovy for weight loss.  He fortunately has not required any emergency room visits or hospitalizations.  He denies any significant orthopnea, paroxysmal atrial dyspnea, or change in his chronic lower extremity edema.  He denies any exertional angina.       Current Medications: Current Meds   Medication Sig   aspirin EC 81 MG tablet Take 1 tablet (81 mg total) by mouth daily.   atorvastatin (LIPITOR) 40 MG tablet TAKE 1 TABLET BY MOUTH EVERY DAY   diltiazem (CARDIZEM) 60 MG tablet TAKE 1 & 1/2 TABLET BY MOUTH 3 TIMES DAILY.   empagliflozin (JARDIANCE) 10 MG TABS tablet Take 1 tablet (10 mg total) by mouth daily before breakfast.   ferrous sulfate 325 (65 FE) MG tablet Take 1 tablet (325 mg total) by mouth daily with breakfast.   furosemide (LASIX) 40 MG tablet TAKE 1.5 TABLETS BY MOUTH DAILY.   levalbuterol (XOPENEX HFA) 45 MCG/ACT inhaler TAKE 2 PUFFS BY MOUTH EVERY 4 HOURS AS NEEDED FOR WHEEZE   losartan (COZAAR) 25 MG tablet Take 25 mg by mouth daily.   metoprolol tartrate (LOPRESSOR) 25 MG tablet TAKE 1.5 TABLETS (37.5 MG TOTAL) BY MOUTH 2 (TWO) TIMES DAILY.   Multiple Vitamin (MULTI-VITAMIN DAILY) TABS Take 1 tablet by mouth daily.   nitroGLYCERIN (NITROSTAT) 0.4 MG SL tablet Place 0.4 mg under the tongue every 5 (five) minutes as needed for chest pain.   simethicone (MYLICON) 696 MG chewable tablet Chew 125 mg by mouth 3 (three) times daily.   tamsulosin (FLOMAX) 0.4 MG CAPS capsule TAKE 1 CAPSULE BY MOUTH EVERY DAY   [DISCONTINUED] spironolactone (ALDACTONE) 25 MG tablet TAKE 1/2 TABLET BY MOUTH EVERY DAY     Allergies:    Patient has no known allergies.   Social History:   Social History   Tobacco Use   Smoking status: Former    Packs/day: 1.00    Years: 38.00    Total pack years: 38.00    Types: Cigarettes    Quit date: 07/15/2008    Years since quitting: 13.6   Smokeless tobacco: Never  Vaping Use   Vaping Use: Never used  Substance Use Topics   Alcohol use: Yes    Alcohol/week: 0.0 standard drinks of alcohol    Comment: none snice colostomy placed   Drug use: No     Family Hx: Family History  Problem Relation Age of Onset   Hypertension Father    Heart disease Father        before age 20   Diabetes Father    Hypertension Mother    Dementia  Mother    Parkinsonism Mother    Liver cancer Maternal Grandmother        dx in her 53s   Prostate cancer Maternal Uncle    Leukemia Maternal Uncle      Review of Systems:   Please see the history of present illness.    All other systems reviewed and are negative.  EKGs/Labs/Other Test Reviewed:    EKG: EKG performed today that I personally reviewed demonstrates sinus rhythm with occasional PVCs.  Prior CV studies:  TTE 2023:  1. Left ventricular ejection fraction, by estimation, is 45%. The left  ventricle has mildly decreased function. The left ventricle demonstrates  regional wall motion abnormalities (see scoring diagram/findings for  description). The left ventricular  internal cavity size was moderately dilated. Left ventricular diastolic  parameters are consistent with Grade I diastolic dysfunction (impaired  relaxation). Elevated left atrial pressure.  There is hypokinesis of the  left ventricular, entire inferior wall.   2. Right ventricular systolic function is normal. The right ventricular  size is normal. Tricuspid regurgitation signal is inadequate for assessing  PA pressure.   3. Left atrial size was mildly dilated.   4. Right atrial size was mildly dilated.   5. The mitral valve is normal in structure. No evidence of mitral valve  regurgitation.   6. The aortic valve is tricuspid. There is mild calcification of the  aortic valve. There is mild thickening of the aortic valve. Aortic valve  regurgitation is not visualized. Aortic valve sclerosis is present, with  no evidence of aortic valve stenosis.   7. Aortic dilatation noted. There is mild dilatation of the aortic root,  measuring 39 mm. There is mild dilatation of the ascending aorta,  measuring 41 mm.   8. The inferior vena cava is normal in size with greater than 50%  respiratory variability, suggesting right atrial pressure of 3 mmHg.   Monitor 2023: 3 Supraventricular Tachycardia runs occurred, the  run with the fastest interval lasting 4 beats with a max rate of 150 bpm, the longest lasting 5 beats with an avg rate of 106 bpm.    Isolated SVEs were occasional (1.9%, 5498), SVE Couplets were rare (<1.0%, 244), and SVE Triplets were rare (<1.0%, 34).    Isolated VEs were frequent (12.5%, [id number]), VE Couplets were rare (<1.0%, 1261), and VE Triplets were rare (<1.0%, 76). Ventricular Bigeminy and Trigeminy were present.   No atrial fibrillation, ventricular tachyarrhythmias, or bradyarrhythmias were detected.   Patient triggered events corresponded with sinus rhythm, PVCs, and PACs.      Imaging studies that I have independently reviewed today: CT scan abdomen pelvis 2019 with aortic atherosclerosis  Recent Labs: 08/30/2021: BUN 30; Creatinine, Ser 2.60; Magnesium 2.5; Potassium 4.2; Sodium 143; TSH 1.380   Recent Lipid Panel Lab Results  Component Value Date/Time   CHOL 148 11/23/2020 02:13 PM   TRIG 140 11/23/2020 02:13 PM   HDL 53 11/23/2020 02:13 PM   LDLCALC 71 11/23/2020 02:13 PM    Risk Assessment/Calculations:           Physical Exam:    VS:  BP 119/62   Pulse (!) 46   Ht '5\' 11"'$  (1.803 m)   Wt (!) 362 lb 3.2 oz (164.3 kg)   SpO2 98%   BMI 50.52 kg/m    Wt Readings from Last 3 Encounters:  03/01/22 (!) 362 lb 3.2 oz (164.3 kg)  09/27/21 (!) 352 lb 6.4 oz (159.8 kg)  08/30/21 (!) 358 lb (162.4 kg)    GENERAL:  No apparent distress, AOx3 HEENT:  No carotid bruits, +2 carotid impulses, no scleral icterus CAR: RRR  no murmurs, gallops, rubs, or thrills RES:  Clear to auscultation bilaterally ABD:  Soft, nontender, nondistended, positive bowel sounds x 4 VASC:  +2 radial pulses, +2 carotid pulses, palpable pedal pulses NEURO:  CN 2-12 grossly intact; motor and sensory grossly intact PSYCH:  No active depression or anxiety EXT:  No edema, ecchymosis, or cyanosis  Signed, Early Osmond, MD  03/01/2022 1:52 PM    [address], [place], [place]  [address] Phone: [phone]; Fax: [phone]   Note:  This document was prepared using Dragon voice recognition software and may include unintentional dictation errors.

## 2022-03-01 ENCOUNTER — Encounter: Payer: Self-pay | Admitting: Internal Medicine

## 2022-03-01 ENCOUNTER — Ambulatory Visit: Payer: Medicare Other | Attending: Internal Medicine | Admitting: Internal Medicine

## 2022-03-01 VITALS — BP 119/62 | HR 46 | Ht 71.0 in | Wt 362.2 lb

## 2022-03-01 DIAGNOSIS — I5022 Chronic systolic (congestive) heart failure: Secondary | ICD-10-CM | POA: Diagnosis present

## 2022-03-01 DIAGNOSIS — I10 Essential (primary) hypertension: Secondary | ICD-10-CM | POA: Diagnosis not present

## 2022-03-01 DIAGNOSIS — I2510 Atherosclerotic heart disease of native coronary artery without angina pectoris: Secondary | ICD-10-CM | POA: Insufficient documentation

## 2022-03-01 DIAGNOSIS — J449 Chronic obstructive pulmonary disease, unspecified: Secondary | ICD-10-CM | POA: Insufficient documentation

## 2022-03-01 DIAGNOSIS — N1831 Chronic kidney disease, stage 3a: Secondary | ICD-10-CM | POA: Diagnosis present

## 2022-03-01 DIAGNOSIS — I255 Ischemic cardiomyopathy: Secondary | ICD-10-CM | POA: Diagnosis not present

## 2022-03-01 DIAGNOSIS — Z6841 Body Mass Index (BMI) 40.0 and over, adult: Secondary | ICD-10-CM | POA: Diagnosis present

## 2022-03-01 DIAGNOSIS — E785 Hyperlipidemia, unspecified: Secondary | ICD-10-CM | POA: Insufficient documentation

## 2022-03-01 DIAGNOSIS — I4719 Other supraventricular tachycardia: Secondary | ICD-10-CM | POA: Insufficient documentation

## 2022-03-01 NOTE — Patient Instructions (Signed)
Medication Instructions:  Your physician has recommended you make the following change in your medication:  1.) stop spironolactone  *If you need a refill on your cardiac medications before your next appointment, please call your pharmacy*   Lab Work: none   Testing/Procedures: Your physician has requested that you have an echocardiogram. Echocardiography is a painless test that uses sound waves to create images of your heart. It provides your doctor with information about the size and shape of your heart and how well your heart's chambers and valves are working. This procedure takes approximately one hour. There are no restrictions for this procedure. Please do NOT wear cologne, perfume, aftershave, or lotions (deodorant is allowed). Please arrive 15 minutes prior to your appointment time.   Follow-Up: At [facility], you and your health needs are our priority.  As part of our continuing mission to provide you with exceptional heart care, we have created designated Provider Care Teams.  These Care Teams include your primary Cardiologist (physician) and Advanced Practice Providers (APPs -  Physician Assistants and Nurse Practitioners) who all work together to provide you with the care you need, when you need it.    Your next appointment:   6 month(s)  The format for your next appointment:   In Person  Provider:   Lenna Sciara or Advanced Practice Provider (NP or PA)    Important Information About Sugar

## 2022-03-01 NOTE — Telephone Encounter (Signed)
**Note De-Identified  Obfuscation** Per the pts request, I started another Jardiance PA through covermymeds and received this message from his plan: Ignacia Marvel (Key: [id number]) Outcome The patient currently has access to the requested medication and a Prior Authorization is not needed for the patient/medication. Drug Jardiance '10MG'$  tablets ePA cloud Social research officer, government PA Form [phone] NCPDP)  I then called [id number] [place] [place] [facility] [address] (Ph: [phone] and was advised that a Jardiance PA is not needed at this time and that they have a 90 day supply of Jardiance ready for the pt to pick up and that his cost is $60/90 day supply.  I have called the pt and made him aware of this and he verbalized understanding and thanked me for my assistance and for this call.

## 2022-03-23 ENCOUNTER — Other Ambulatory Visit: Payer: Self-pay | Admitting: Internal Medicine

## 2022-03-29 ENCOUNTER — Ambulatory Visit (HOSPITAL_COMMUNITY): Payer: Medicare Other | Attending: Internal Medicine

## 2022-03-29 DIAGNOSIS — I255 Ischemic cardiomyopathy: Secondary | ICD-10-CM | POA: Diagnosis present

## 2022-03-29 LAB — ECHOCARDIOGRAM COMPLETE
Area-P 1/2: 4.01 cm2
S' Lateral: 3.9 cm

## 2022-03-29 MED ORDER — PERFLUTREN LIPID MICROSPHERE
1.0000 mL | INTRAVENOUS | Status: AC | PRN
  Administered 2022-03-29: 2 mL via INTRAVENOUS

## 2022-04-04 NOTE — Progress Notes (Deleted)
Electrophysiology Office Follow up Visit Note:    Date:  04/04/2022   ID:  Larry [name], Larry [name] [birth year], MRN [id number]  PCP:  Lin Landsman, [facility] Cardiologist:  Early Osmond, MD  [facility] Electrophysiologist:  Vickie Epley, MD    Interval History:    Larry [name] is a 72 y.o. male who presents for a follow up visit.  They were last seen by [facility] September 27, 2021 for history of atrial tachycardia.  The patient was previously followed by Dr. Rayann Heman.  He has a history of coronary disease, ischemic cardiomyopathy with a recovered ejection fraction, morbid obesity, atrial tachycardia, obstructive sleep apnea with CPAP, colon cancer, hypertension, hyperlipidemia, CKD 3A.  According to previous notes by Joseph Art, he is homebound due to troubles with swelling, wounds, weight.  From a cardiac perspective, the patient is doing well at the last appointment with [facility].       Past Medical History:  Diagnosis Date   Arthritis    Atrial tachycardia    CAD in native artery    Childhood asthma    "went away after I was 16"   Chronic combined systolic and diastolic CHF, NYHA class 3 (HCC)    CKD (chronic kidney disease) stage 3, GFR 30-59 ml/min (HCC)    Colon cancer ([place]) 2015   MSI high; IHC loss of MLH1 and PMS2; BRAF negative; Negative methylation   COPD (chronic obstructive pulmonary disease) (HCC)    Coronary artery disease    Family history of ovarian cancer    Hypercholesteremia    Hypertension    Noncompliance    NSVT (nonsustained ventricular tachycardia) (HCC)    Obesity    OSA on CPAP    used nightly, pt does not know settings   S/P colostomy ([place])    2014   Thoracic aortic aneurysm (HCC)    4.2 cm thoracic aortic aneurysm per chest ct 11-14-15 epic   Thrombocytopenia ([place])    Torsades de pointes ([place])    X 2 episodes during hospital visit 12'14"electrolyte imbalance"- "Shocked"    Past Surgical History:  Procedure Laterality Date    COLON SURGERY     COLONOSCOPY N/A 02/08/2013   Procedure: COLONOSCOPY;  Surgeon: [name], MD;  Location: [facility] END[facility]COPY;  Service: Endoscopy;  Laterality: N/A;   COLONOSCOPY WITH PROPOFOL N/A 05/09/2014   Procedure: COLONOSCOPY WITH PROPOFOL;  Surgeon: [name], MD;  Location: [facility] ENDOSCOPY;  Service: Endoscopy;  Laterality: N/A;   COLONOSCOPY WITH PROPOFOL N/A 01/08/2016   Procedure: COLONOSCOPY WITH PROPOFOL;  Surgeon: [name], MD;  Location: [facility]COPY;  Service: Endoscopy;  Laterality: N/A;   COLONOSCOPY WITH PROPOFOL N/A 12/28/2018   Procedure: COLONOSCOPY WITH PROPOFOL;  Surgeon: [name], MD;  Location: [facility]OSCOPY;  Service: Endoscopy;  Laterality: N/A;   COLOSTOMY N/A 04/19/2016   Procedure: COLOSTOMY;  Surgeon: Judeth Horn, MD;  Location: [place];  Service: General;  Laterality: N/A;   COLOSTOMY REVERSAL  04/12/2016   COLOSTOMY REVERSAL N/A 04/12/2016   Procedure: COLOSTOMY REVERSAL;  Surgeon: Judeth Horn, MD;  Location: [place];  Service: General;  Laterality: N/A;   CORONARY ANGIOPLASTY WITH STENT PLACEMENT  11/12/2008; 06/11/2014   stent x 2 to RCA; stent x 2 to LAD   CORONARY ANGIOPLASTY WITH STENT PLACEMENT  06/11/2014   m-LAD 3.5 x 16 mm Synergy DES, d-LAD  2.25 x 16 mm Synergy DES   CYSTOSCOPY W/ URETERAL STENT PLACEMENT Left 06/30/2017   Procedure: CYSTOSCOPY  WITH RETROGRADE PYELOGRAM/URETERAL STENT PLACEMENT;  Surgeon: Lucas Mallow, MD;  Location: [place];  Service: Urology;  Laterality: Left;   CYSTOSCOPY WITH LITHOLAPAXY N/A 08/02/2017   Procedure: CYSTOSCOPY WITH BLADDER STONE EXTRACTION;  Surgeon: Lucas Mallow, MD;  Location: [facility];  Service: Urology;  Laterality: N/A;   CYSTOSCOPY WITH RETROGRADE PYELOGRAM, URETEROSCOPY AND STENT PLACEMENT Left 08/02/2017   Procedure: CYSTOSCOPY WITH LEFT RETROGRADE PYELOGRAM, URETEROSCOPY AND STENT EXCHANGE;  Surgeon: Lucas Mallow, MD;  Location: [facility];  Service: Urology;  Laterality: Left;    ESOPHAGOGASTRODUODENOSCOPY N/A 02/08/2013   Procedure: ESOPHAGOGASTRODUODENOSCOPY (EGD);  Surgeon: [name], MD;  Location: [facility] END[facility]COPY;  Service: Endoscopy;  Laterality: N/A;   FLEXIBLE SIGMOIDOSCOPY N/A 02/19/2016   Procedure: FLEXIBLE SIGMOIDOSCOPY;  Surgeon: [name], MD;  Location: [facility];  Service: Endoscopy;  Laterality: N/A;   HOLMIUM LASER APPLICATION Left [id number]   Procedure: HOLMIUM LASER APPLICATION;  Surgeon: Lucas Mallow, MD;  Location: [facility];  Service: Urology;  Laterality: Left;   LAPAROTOMY N/A 02/12/2013   Procedure: EXPLORATORY LAPAROTOMY PARTIAL COLECTOMY WITH COLOSTOMY;  Surgeon: [name], MD;  Location: [place];  Service: General;  Laterality: N/A;   LAPAROTOMY N/A 02/18/2013   Procedure: EXPLORATORY LAPAROTOMY/Closure of Wound;  Surgeon: [name], MD;  Location: [place];  Service: General;  Laterality: N/A;   LAPAROTOMY N/A 04/19/2016   Procedure: EXPLORATORY LAPAROTOMY, REPAIR OF ANASTAMOTIC LEAK;  Surgeon: Judeth Horn, MD;  Location: [place];  Service: General;  Laterality: N/A;   LEFT HEART CATHETERIZATION WITH CORONARY ANGIOGRAM N/A 06/11/2014   Procedure: LEFT HEART CATHETERIZATION WITH CORONARY ANGIOGRAM;  Surgeon: [name], MD; CFX calcified, 30-40 percent, RCA calcified, 40/50/40%, PDA diffuse disease, LAD 40/75/90% s/p DES 2    POLYPECTOMY  12/28/2018   Procedure: POLYPECTOMY;  Surgeon: [name], MD;  Location: [facility];  Service: Endoscopy;;    Current Medications: No outpatient medications have been marked as taking for the 04/05/22 encounter (Appointment) with Vickie Epley, MD.     Allergies:   Patient has no known allergies.   Social History   Socioeconomic History   Marital status: Married    Spouse name: Larry [name]   Number of children: 1   Years of education: Not on file   Highest education level: Not on file  Occupational History   Not on file  Tobacco Use   Smoking status: Former    Packs/day:  1.00    Years: 38.00    Total pack years: 38.00    Types: Cigarettes    Quit date: 07/15/2008    Years since quitting: 13.7   Smokeless tobacco: Never  Vaping Use   Vaping Use: Never used  Substance and Sexual Activity   Alcohol use: Yes    Alcohol/week: 0.0 standard drinks of alcohol    Comment: none snice colostomy placed   Drug use: No   Sexual activity: Not Currently  Other Topics Concern   Not on file  Social History Narrative   Not on file   Social Determinants of Health   Financial Resource Strain: Not on file  Food Insecurity: Not on file  Transportation Needs: Not on file  Physical Activity: Not on file  Stress: Not on file  Social Connections: Not on file     Family History: The patient's family history includes Dementia in his mother; Diabetes in his father; Heart disease in his father; Hypertension in his father and mother; Leukemia in his maternal  uncle; Liver cancer in his maternal grandmother; Parkinsonism in his mother; Prostate cancer in his maternal uncle.  ROS:   Please see the history of present illness.    All other systems reviewed and are negative.  EKGs/Labs/Other Studies Reviewed:    The following studies were reviewed today:    Recent Labs: 08/30/2021: BUN 30; Creatinine, Ser 2.60; Magnesium 2.5; Potassium 4.2; Sodium 143; TSH 1.380  Recent Lipid Panel    Component Value Date/Time   CHOL 148 11/23/2020 1413   TRIG 140 11/23/2020 1413   HDL 53 11/23/2020 1413   CHOLHDL 2.8 11/23/2020 1413   CHOLHDL 3.6 06/29/2015 1416   VLDL 28 06/29/2015 1416   LDLCALC 71 11/23/2020 1413    Physical Exam:    VS:  There were no vitals taken for this visit.    Wt Readings from Last 3 Encounters:  03/01/22 (!) 362 lb 3.2 oz (164.3 kg)  09/27/21 (!) 352 lb 6.4 oz (159.8 kg)  08/30/21 (!) 358 lb (162.4 kg)     GEN: *** Well nourished, well developed in no acute distress CARDIAC: ***RRR, no murmurs, rubs, gallops       ASSESSMENT:    No  diagnosis found. PLAN:    In order of problems listed above:  #Atrial tachycardia Doing well with fairly low burden. Continue metoprolol and diltiazem.  #Hypertension *** goal today.  Recommend checking blood pressures 1-2 times per week at home and recording the values.  Recommend bringing these recordings to the primary care physician.   1 year with Renee.      Medication Adjustments/Labs and Tests Ordered: Current medicines are reviewed at length with the patient today.  Concerns regarding medicines are outlined above.  No orders of the defined types were placed in this encounter.  No orders of the defined types were placed in this encounter.    Signed, Lars Mage, MD, [facility], [facility] 04/04/2022 4:40 PM    Electrophysiology [facility]

## 2022-04-05 ENCOUNTER — Ambulatory Visit: Payer: Federal, State, Local not specified - PPO | Admitting: Cardiology

## 2022-04-05 DIAGNOSIS — I4719 Other supraventricular tachycardia: Secondary | ICD-10-CM

## 2022-04-05 DIAGNOSIS — I10 Essential (primary) hypertension: Secondary | ICD-10-CM

## 2022-04-05 NOTE — Progress Notes (Incomplete)
Electrophysiology Office Follow up Visit Note:    Date:  04/05/2022   ID:  Larry [name], Larry [name] Oct 02, 1950, MRN [id number]  PCP:  Lin Landsman, [facility] Cardiologist:  Early Osmond, MD  [facility] Electrophysiologist:  Vickie Epley, MD    Interval History:    Larry [name] is a 72 y.o. male who presents for a follow up visit.  They were last seen by [facility] September 27, 2021 for history of atrial tachycardia.  The patient was previously followed by Dr. Rayann Heman.  He has a history of coronary disease, ischemic cardiomyopathy with a recovered ejection fraction, morbid obesity, atrial tachycardia, obstructive sleep apnea with CPAP, colon cancer, hypertension, hyperlipidemia, CKD 3A.  According to previous notes by Joseph Art, he is homebound due to troubles with swelling, wounds, weight.  From a cardiac perspective, the patient is doing well at the last appointment with [facility].   Today,    *** denies any palpitations, chest pain, shortness of breath, or peripheral edema. No lightheadedness, headaches, syncope, orthopnea, or PND.  (+)   Past Medical History:  Diagnosis Date   Arthritis    Atrial tachycardia    CAD in native artery    Childhood asthma    "went away after I was 16"   Chronic combined systolic and diastolic CHF, NYHA class 3 (HCC)    CKD (chronic kidney disease) stage 3, GFR 30-59 ml/min (HCC)    Colon cancer ([place]) 2015   MSI high; IHC loss of MLH1 and PMS2; BRAF negative; Negative methylation   COPD (chronic obstructive pulmonary disease) (HCC)    Coronary artery disease    Family history of ovarian cancer    Hypercholesteremia    Hypertension    Noncompliance    NSVT (nonsustained ventricular tachycardia) (HCC)    Obesity    OSA on CPAP    used nightly, pt does not know settings   S/P colostomy ([place])    2014   Thoracic aortic aneurysm (HCC)    4.2 cm thoracic aortic aneurysm per chest ct 11-14-15 epic   Thrombocytopenia ([place])     Torsades de pointes ([place])    X 2 episodes during hospital visit 12'14"electrolyte imbalance"- "Shocked"    Past Surgical History:  Procedure Laterality Date   COLON SURGERY     COLONOSCOPY N/A 02/08/2013   Procedure: COLONOSCOPY;  Surgeon: [name], MD;  Location: [facility] ENDOSCOPY;  Service: Endoscopy;  Laterality: N/A;   COLONOSCOPY WITH PROPOFOL N/A 05/09/2014   Procedure: COLONOSCOPY WITH PROPOFOL;  Surgeon: [name], MD;  [facility] ENDOSCOPY;  Service: Endoscopy;  Laterality: N/A;   COLONOSCOPY WITH PROPOFOL N/A 01/08/2016   Procedure: COLONOSCOPY WITH PROPOFOL;  Surgeon: [name], MD;  [facility] ENDOSCOPY;  Service: Endoscopy;  Laterality: N/A;   COLONOSCOPY WITH PROPOFOL N/A 12/28/2018   Procedure: COLONOSCOPY WITH PROPOFOL;  Surgeon: [name], MD;  [facility] ENDOSCOPY;  Service: Endoscopy;  Laterality: N/A;   COLOSTOMY N/A 04/19/2016   Procedure: COLOSTOMY;  Surgeon: Judeth Horn, MD;  Location: [place];  Service: General;  Laterality: N/A;   COLOSTOMY REVERSAL  04/12/2016   COLOSTOMY REVERSAL N/A 04/12/2016   Procedure: COLOSTOMY REVERSAL;  Surgeon: Judeth Horn, MD;  Location: [place];  Service: General;  Laterality: N/A;   CORONARY ANGIOPLASTY WITH STENT PLACEMENT  11/12/2008; 06/11/2014   stent x 2 to RCA; stent x 2 to LAD   CORONARY ANGIOPLASTY WITH STENT PLACEMENT  06/11/2014   m-LAD 3.5 x 16 mm  Synergy DES, d-LAD  2.25 x 16 mm Synergy DES   CYSTOSCOPY W/ URETERAL STENT PLACEMENT Left 06/30/2017   Procedure: CYSTOSCOPY WITH RETROGRADE PYELOGRAM/URETERAL STENT PLACEMENT;  Surgeon: Lucas Mallow, MD;  Location: [place];  Service: Urology;  Laterality: Left;   CYSTOSCOPY WITH LITHOLAPAXY N/A 08/02/2017   Procedure: CYSTOSCOPY WITH BLADDER STONE EXTRACTION;  Surgeon: Lucas Mallow, MD;  Location[facility];  Service: Urology;  Laterality: N/A;   CYSTOSCOPY WITH RETROGRADE PYELOGRAM, URETEROSCOPY AND STENT PLACEMENT Left 08/02/2017   Procedure: CYSTOSCOPY WITH LEFT  RETROGRADE PYELOGRAM, URETEROSCOPY AND STENT EXCHANGE;  Surgeon: Lucas Mallow, MD;  [facility];  Service: Urology;  Laterality: Left;   ESOPHAGOGASTRODUODENOSCOPY N/A 02/08/2013   Procedure: ESOPHAGOGASTRODUODENOSCOPY (EGD);  Surgeon: [name], MD;  Location: [facility] ENDOSCOPY;  Service: Endoscopy;  Laterality: N/A;   FLEXIBLE SIGMOIDOSCOPY N/A 02/19/2016   Procedure: FLEXIBLE SIGMOIDOSCOPY;  Surgeon: [name], MD;  [facility];  Service: Endoscopy;  Laterality: N/A;   HOLMIUM LASER APPLICATION Left [id number]   Procedure: HOLMIUM LASER APPLICATION;  Surgeon: Lucas Mallow, MD;  [facility];  Service: Urology;  Laterality: Left;   LAPAROTOMY N/A 02/12/2013   Procedure: EXPLORATORY LAPAROTOMY PARTIAL COLECTOMY WITH COLOSTOMY;  Surgeon: [name], MD;  Location: [place];  Service: General;  Laterality: N/A;   LAPAROTOMY N/A 02/18/2013   Procedure: EXPLORATORY LAPAROTOMY/Closure of Wound;  Surgeon: [name], MD;  Location: [place];  Service: General;  Laterality: N/A;   LAPAROTOMY N/A 04/19/2016   Procedure: EXPLORATORY LAPAROTOMY, REPAIR OF ANASTAMOTIC LEAK;  Surgeon: Judeth Horn, MD;  Location: [place];  Service: General;  Laterality: N/A;   LEFT HEART CATHETERIZATION WITH CORONARY ANGIOGRAM N/A 06/11/2014   Procedure: LEFT HEART CATHETERIZATION WITH CORONARY ANGIOGRAM;  Surgeon: [name], MD; CFX calcified, 30-40 percent, RCA calcified, 40/50/40%, PDA diffuse disease, LAD 40/75/90% s/p DES 2    POLYPECTOMY  12/28/2018   Procedure: POLYPECTOMY;  Surgeon: [name], MD;  [facility] ENDOS[facility];  Service: Endoscopy;;    Current Medications: No outpatient medications have been marked as taking for the 04/05/22 encounter (Appointment) with Vickie Epley, MD.     Allergies:   Patient has no known allergies.   Social History   Socioeconomic History   Marital status: Married    Spouse name: [name]   Number of children: 1   Years of education:  Not on file   Highest education level: Not on file  Occupational History   Not on file  Tobacco Use   Smoking status: Former    Packs/day: 1.00    Years: 38.00    Total pack years: 38.00    Types: Cigarettes    Quit date: 07/15/2008    Years since quitting: 13.7   Smokeless tobacco: Never  Vaping Use   Vaping Use: Never used  Substance and Sexual Activity   Alcohol use: Yes    Alcohol/week: 0.0 standard drinks of alcohol    Comment: none snice colostomy placed   Drug use: No   Sexual activity: Not Currently  Other Topics Concern   Not on file  Social History Narrative   Not on file   Social Determinants of Health   Financial Resource Strain: Not on file  Food Insecurity: Not on file  Transportation Needs: Not on file  Physical Activity: Not on file  Stress: Not on file  Social Connections: Not on file     Family History: The patient's family history includes  Dementia in his mother; Diabetes in his father; Heart disease in his father; Hypertension in his father and mother; Leukemia in his maternal uncle; Liver cancer in his maternal grandmother; Parkinsonism in his mother; Prostate cancer in his maternal uncle.  ROS:   Please see the history of present illness.    All other systems reviewed and are negative.  EKGs/Labs/Other Studies Reviewed:    The following studies were reviewed today:    Recent Labs: 08/30/2021: BUN 30; Creatinine, Ser 2.60; Magnesium 2.5; Potassium 4.2; Sodium 143; TSH 1.380  Recent Lipid Panel    Component Value Date/Time   CHOL 148 11/23/2020 1413   TRIG 140 11/23/2020 1413   HDL 53 11/23/2020 1413   CHOLHDL 2.8 11/23/2020 1413   CHOLHDL 3.6 06/29/2015 1416   VLDL 28 06/29/2015 1416   LDLCALC 71 11/23/2020 1413    Physical Exam:    VS:  There were no vitals taken for this visit.    Wt Readings from Last 3 Encounters:  03/01/22 (!) 362 lb 3.2 oz (164.3 kg)  09/27/21 (!) 352 lb 6.4 oz (159.8 kg)  08/30/21 (!) 358 lb (162.4 kg)      GEN: *** Well nourished, well developed in no acute distress CARDIAC: ***RRR, no murmurs, rubs, gallops       ASSESSMENT:    1. Atrial tachycardia   2. Primary hypertension    PLAN:    In order of problems listed above:  #Atrial tachycardia Doing well with fairly low burden. Continue metoprolol and diltiazem.  #Hypertension *** goal today.  Recommend checking blood pressures 1-2 times per week at home and recording the values.  Recommend bringing these recordings to the primary care physician.   1 year with Renee.   *** Plan: - Follow up in *** -     Medication Adjustments/Labs and Tests Ordered: Current medicines are reviewed at length with the patient today.  Concerns regarding medicines are outlined above.  No orders of the defined types were placed in this encounter.  No orders of the defined types were placed in this encounter.   I,Rachel Rivera,acting as a scribe for Vickie Epley, MD.,have documented all relevant documentation on the behalf of Vickie Epley, MD,as directed by  Vickie Epley, MD while in the presence of Vickie Epley, MD.  ***  Signed, Lars Mage, MD, [facility], [facility] 04/05/2022 7:52 AM    Electrophysiology [facility]

## 2022-05-14 ENCOUNTER — Inpatient Hospital Stay (HOSPITAL_COMMUNITY)
Admission: EM | Admit: 2022-05-14 | Discharge: 2022-05-24 | DRG: 872 | Disposition: A | Discharge status: Skilled Nursing Facility | Payer: Medicare Other | Attending: Internal Medicine | Admitting: Internal Medicine

## 2022-05-14 ENCOUNTER — Inpatient Hospital Stay (HOSPITAL_COMMUNITY): Payer: Medicare Other

## 2022-05-14 ENCOUNTER — Other Ambulatory Visit: Payer: Self-pay

## 2022-05-14 ENCOUNTER — Encounter (HOSPITAL_COMMUNITY): Payer: Self-pay

## 2022-05-14 DIAGNOSIS — E876 Hypokalemia: Secondary | ICD-10-CM | POA: Diagnosis not present

## 2022-05-14 DIAGNOSIS — D509 Iron deficiency anemia, unspecified: Secondary | ICD-10-CM | POA: Diagnosis present

## 2022-05-14 DIAGNOSIS — I5042 Chronic combined systolic (congestive) and diastolic (congestive) heart failure: Secondary | ICD-10-CM | POA: Diagnosis present

## 2022-05-14 DIAGNOSIS — R2243 Localized swelling, mass and lump, lower limb, bilateral: Secondary | ICD-10-CM | POA: Diagnosis not present

## 2022-05-14 DIAGNOSIS — Z8042 Family history of malignant neoplasm of prostate: Secondary | ICD-10-CM

## 2022-05-14 DIAGNOSIS — J449 Chronic obstructive pulmonary disease, unspecified: Secondary | ICD-10-CM | POA: Diagnosis present

## 2022-05-14 DIAGNOSIS — R7881 Bacteremia: Secondary | ICD-10-CM | POA: Diagnosis not present

## 2022-05-14 DIAGNOSIS — E669 Obesity, unspecified: Secondary | ICD-10-CM | POA: Diagnosis present

## 2022-05-14 DIAGNOSIS — G8929 Other chronic pain: Secondary | ICD-10-CM | POA: Diagnosis present

## 2022-05-14 DIAGNOSIS — B961 Klebsiella pneumoniae [K. pneumoniae] as the cause of diseases classified elsewhere: Secondary | ICD-10-CM | POA: Diagnosis not present

## 2022-05-14 DIAGNOSIS — I872 Venous insufficiency (chronic) (peripheral): Secondary | ICD-10-CM | POA: Diagnosis present

## 2022-05-14 DIAGNOSIS — Z933 Colostomy status: Secondary | ICD-10-CM | POA: Diagnosis not present

## 2022-05-14 DIAGNOSIS — I130 Hypertensive heart and chronic kidney disease with heart failure and stage 1 through stage 4 chronic kidney disease, or unspecified chronic kidney disease: Secondary | ICD-10-CM | POA: Diagnosis present

## 2022-05-14 DIAGNOSIS — Z87891 Personal history of nicotine dependence: Secondary | ICD-10-CM | POA: Diagnosis not present

## 2022-05-14 DIAGNOSIS — Z8249 Family history of ischemic heart disease and other diseases of the circulatory system: Secondary | ICD-10-CM

## 2022-05-14 DIAGNOSIS — E872 Acidosis, unspecified: Secondary | ICD-10-CM | POA: Diagnosis not present

## 2022-05-14 DIAGNOSIS — L309 Dermatitis, unspecified: Secondary | ICD-10-CM | POA: Diagnosis present

## 2022-05-14 DIAGNOSIS — N1832 Chronic kidney disease, stage 3b: Secondary | ICD-10-CM | POA: Diagnosis present

## 2022-05-14 DIAGNOSIS — N179 Acute kidney failure, unspecified: Secondary | ICD-10-CM | POA: Diagnosis not present

## 2022-05-14 DIAGNOSIS — L03115 Cellulitis of right lower limb: Principal | ICD-10-CM | POA: Diagnosis present

## 2022-05-14 DIAGNOSIS — Z6841 Body Mass Index (BMI) 40.0 and over, adult: Secondary | ICD-10-CM

## 2022-05-14 DIAGNOSIS — Z79899 Other long term (current) drug therapy: Secondary | ICD-10-CM

## 2022-05-14 DIAGNOSIS — E7800 Pure hypercholesterolemia, unspecified: Secondary | ICD-10-CM | POA: Diagnosis present

## 2022-05-14 DIAGNOSIS — L97519 Non-pressure chronic ulcer of other part of right foot with unspecified severity: Secondary | ICD-10-CM | POA: Diagnosis present

## 2022-05-14 DIAGNOSIS — I2510 Atherosclerotic heart disease of native coronary artery without angina pectoris: Secondary | ICD-10-CM | POA: Diagnosis present

## 2022-05-14 DIAGNOSIS — Z955 Presence of coronary angioplasty implant and graft: Secondary | ICD-10-CM

## 2022-05-14 DIAGNOSIS — L280 Lichen simplex chronicus: Secondary | ICD-10-CM | POA: Diagnosis present

## 2022-05-14 DIAGNOSIS — I255 Ischemic cardiomyopathy: Secondary | ICD-10-CM | POA: Diagnosis present

## 2022-05-14 DIAGNOSIS — D631 Anemia in chronic kidney disease: Secondary | ICD-10-CM | POA: Diagnosis present

## 2022-05-14 DIAGNOSIS — R2242 Localized swelling, mass and lump, left lower limb: Secondary | ICD-10-CM | POA: Diagnosis not present

## 2022-05-14 DIAGNOSIS — I700 Atherosclerosis of aorta: Secondary | ICD-10-CM | POA: Diagnosis present

## 2022-05-14 DIAGNOSIS — R2241 Localized swelling, mass and lump, right lower limb: Secondary | ICD-10-CM | POA: Diagnosis not present

## 2022-05-14 DIAGNOSIS — E860 Dehydration: Secondary | ICD-10-CM | POA: Diagnosis not present

## 2022-05-14 DIAGNOSIS — B964 Proteus (mirabilis) (morganii) as the cause of diseases classified elsewhere: Secondary | ICD-10-CM | POA: Diagnosis not present

## 2022-05-14 DIAGNOSIS — Z833 Family history of diabetes mellitus: Secondary | ICD-10-CM

## 2022-05-14 DIAGNOSIS — A4159 Other Gram-negative sepsis: Secondary | ICD-10-CM | POA: Diagnosis present

## 2022-05-14 DIAGNOSIS — F32A Depression, unspecified: Secondary | ICD-10-CM | POA: Diagnosis present

## 2022-05-14 DIAGNOSIS — Z85038 Personal history of other malignant neoplasm of large intestine: Secondary | ICD-10-CM

## 2022-05-14 DIAGNOSIS — I712 Thoracic aortic aneurysm, without rupture, unspecified: Secondary | ICD-10-CM | POA: Diagnosis present

## 2022-05-14 DIAGNOSIS — L03116 Cellulitis of left lower limb: Secondary | ICD-10-CM | POA: Diagnosis not present

## 2022-05-14 DIAGNOSIS — Z806 Family history of leukemia: Secondary | ICD-10-CM

## 2022-05-14 DIAGNOSIS — M1990 Unspecified osteoarthritis, unspecified site: Secondary | ICD-10-CM | POA: Diagnosis present

## 2022-05-14 DIAGNOSIS — Z7982 Long term (current) use of aspirin: Secondary | ICD-10-CM

## 2022-05-14 DIAGNOSIS — G4733 Obstructive sleep apnea (adult) (pediatric): Secondary | ICD-10-CM | POA: Diagnosis present

## 2022-05-14 DIAGNOSIS — Z800 Family history of malignant neoplasm of digestive organs: Secondary | ICD-10-CM

## 2022-05-14 DIAGNOSIS — Z9049 Acquired absence of other specified parts of digestive tract: Secondary | ICD-10-CM

## 2022-05-14 DIAGNOSIS — I493 Ventricular premature depolarization: Secondary | ICD-10-CM | POA: Diagnosis present

## 2022-05-14 DIAGNOSIS — Z7984 Long term (current) use of oral hypoglycemic drugs: Secondary | ICD-10-CM

## 2022-05-14 LAB — CBC WITH DIFFERENTIAL/PLATELET
Abs Immature Granulocytes: 0.03 10*3/uL (ref 0.00–0.07)
Basophils Absolute: 0 10*3/uL (ref 0.0–0.1)
Basophils Relative: 0 %
Eosinophils Absolute: 0 10*3/uL (ref 0.0–0.5)
Eosinophils Relative: 0 %
HCT: 37.6 % — ABNORMAL LOW (ref 39.0–52.0)
Hemoglobin: 11.7 g/dL — ABNORMAL LOW (ref 13.0–17.0)
Immature Granulocytes: 0 %
Lymphocytes Relative: 8 %
Lymphs Abs: 0.7 10*3/uL (ref 0.7–4.0)
MCH: 28.2 pg (ref 26.0–34.0)
MCHC: 31.1 g/dL (ref 30.0–36.0)
MCV: 90.6 fL (ref 80.0–100.0)
Monocytes Absolute: 0.8 10*3/uL (ref 0.1–1.0)
Monocytes Relative: 10 %
Neutro Abs: 6.7 10*3/uL (ref 1.7–7.7)
Neutrophils Relative %: 82 %
Platelets: 195 10*3/uL (ref 150–400)
RBC: 4.15 MIL/uL — ABNORMAL LOW (ref 4.22–5.81)
RDW: 14.4 % (ref 11.5–15.5)
WBC: 8.3 10*3/uL (ref 4.0–10.5)
nRBC: 0 % (ref 0.0–0.2)

## 2022-05-14 LAB — LACTIC ACID, PLASMA: Lactic Acid, Venous: 1.4 mmol/L (ref 0.5–1.9)

## 2022-05-14 LAB — BASIC METABOLIC PANEL
Anion gap: 11 (ref 5–15)
BUN: 20 mg/dL (ref 8–23)
CO2: 22 mmol/L (ref 22–32)
Calcium: 9.5 mg/dL (ref 8.9–10.3)
Chloride: 104 mmol/L (ref 98–111)
Creatinine, Ser: 2.06 mg/dL — ABNORMAL HIGH (ref 0.61–1.24)
GFR, Estimated: 34 mL/min — ABNORMAL LOW (ref >60)
Glucose, Bld: 104 mg/dL — ABNORMAL HIGH (ref 70–99)
Potassium: 3.1 mmol/L — ABNORMAL LOW (ref 3.5–5.1)
Sodium: 137 mmol/L (ref 135–145)

## 2022-05-14 LAB — HIV ANTIBODY (ROUTINE TESTING W REFLEX): HIV Screen 4th Generation wRfx: NONREACTIVE

## 2022-05-14 LAB — MAGNESIUM: Magnesium: 2.2 mg/dL (ref 1.7–2.4)

## 2022-05-14 MED ORDER — SODIUM CHLORIDE 0.9 % IV SOLN
2.0000 g | Freq: Once | INTRAVENOUS | Status: AC
  Administered 2022-05-14: 2 g via INTRAVENOUS
  Filled 2022-05-14: qty 20

## 2022-05-14 MED ORDER — FERROUS SULFATE 325 (65 FE) MG PO TABS
325.0000 mg | ORAL_TABLET | Freq: Every day | ORAL | Status: DC
  Administered 2022-05-15 – 2022-05-24 (×10): 325 mg via ORAL
  Filled 2022-05-14 (×10): qty 1

## 2022-05-14 MED ORDER — ACETAMINOPHEN 500 MG PO TABS
1000.0000 mg | ORAL_TABLET | Freq: Four times a day (QID) | ORAL | Status: DC | PRN
  Administered 2022-05-14 – 2022-05-16 (×5): 1000 mg via ORAL
  Filled 2022-05-14 (×5): qty 2

## 2022-05-14 MED ORDER — ENOXAPARIN SODIUM 40 MG/0.4ML IJ SOSY
40.0000 mg | PREFILLED_SYRINGE | INTRAMUSCULAR | Status: DC
  Administered 2022-05-14: 40 mg via SUBCUTANEOUS
  Filled 2022-05-14: qty 0.4

## 2022-05-14 MED ORDER — SODIUM CHLORIDE 0.9 % IV BOLUS (SEPSIS)
1000.0000 mL | Freq: Once | INTRAVENOUS | Status: AC
  Administered 2022-05-14: 1000 mL via INTRAVENOUS

## 2022-05-14 MED ORDER — LOSARTAN POTASSIUM 50 MG PO TABS
25.0000 mg | ORAL_TABLET | Freq: Every day | ORAL | Status: DC
  Administered 2022-05-15: 25 mg via ORAL
  Filled 2022-05-14: qty 1

## 2022-05-14 MED ORDER — DILTIAZEM HCL 60 MG PO TABS
60.0000 mg | ORAL_TABLET | Freq: Three times a day (TID) | ORAL | Status: DC
  Administered 2022-05-14 – 2022-05-16 (×5): 60 mg via ORAL
  Filled 2022-05-14 (×3): qty 1
  Filled 2022-05-14 (×3): qty 2
  Filled 2022-05-14: qty 1
  Filled 2022-05-14: qty 2
  Filled 2022-05-14 (×2): qty 1

## 2022-05-14 MED ORDER — MORPHINE SULFATE (PF) 4 MG/ML IV SOLN
4.0000 mg | Freq: Once | INTRAVENOUS | Status: AC
  Administered 2022-05-14: 4 mg via INTRAVENOUS
  Filled 2022-05-14: qty 1

## 2022-05-14 MED ORDER — VANCOMYCIN HCL 10 G IV SOLR
2500.0000 mg | Freq: Once | INTRAVENOUS | Status: AC
  Administered 2022-05-14: 2500 mg via INTRAVENOUS
  Filled 2022-05-14: qty 2500

## 2022-05-14 MED ORDER — TAMSULOSIN HCL 0.4 MG PO CAPS
0.4000 mg | ORAL_CAPSULE | Freq: Every day | ORAL | Status: DC
  Administered 2022-05-15 – 2022-05-24 (×10): 0.4 mg via ORAL
  Filled 2022-05-14 (×10): qty 1

## 2022-05-14 MED ORDER — METOPROLOL TARTRATE 25 MG PO TABS
37.5000 mg | ORAL_TABLET | Freq: Two times a day (BID) | ORAL | Status: DC
  Administered 2022-05-14 – 2022-05-15 (×3): 37.5 mg via ORAL
  Filled 2022-05-14 (×4): qty 2

## 2022-05-14 MED ORDER — HYDROMORPHONE HCL 2 MG PO TABS
1.0000 mg | ORAL_TABLET | Freq: Once | ORAL | Status: AC | PRN
  Administered 2022-05-14: 1 mg via ORAL
  Filled 2022-05-14: qty 1

## 2022-05-14 MED ORDER — FUROSEMIDE 20 MG PO TABS
60.0000 mg | ORAL_TABLET | Freq: Every day | ORAL | Status: DC
  Administered 2022-05-15: 60 mg via ORAL
  Filled 2022-05-14 (×2): qty 3

## 2022-05-14 MED ORDER — ATORVASTATIN CALCIUM 40 MG PO TABS
40.0000 mg | ORAL_TABLET | Freq: Every day | ORAL | Status: DC
  Administered 2022-05-15 – 2022-05-24 (×10): 40 mg via ORAL
  Filled 2022-05-14 (×10): qty 1

## 2022-05-14 MED ORDER — ASPIRIN 81 MG PO TBEC
81.0000 mg | DELAYED_RELEASE_TABLET | Freq: Every day | ORAL | Status: DC
  Administered 2022-05-15 – 2022-05-24 (×10): 81 mg via ORAL
  Filled 2022-05-14 (×10): qty 1

## 2022-05-14 MED ORDER — VANCOMYCIN HCL 1250 MG/250ML IV SOLN: 1250.0000 mg | INTRAVENOUS | Status: DC

## 2022-05-14 MED ORDER — WHITE PETROLATUM EX OINT
TOPICAL_OINTMENT | Freq: Every day | CUTANEOUS | Status: DC
  Administered 2022-05-14 – 2022-05-24 (×4): 0.2 via TOPICAL
  Filled 2022-05-14 (×2): qty 28.35

## 2022-05-14 MED ORDER — POTASSIUM CHLORIDE CRYS ER 20 MEQ PO TBCR
40.0000 meq | EXTENDED_RELEASE_TABLET | Freq: Once | ORAL | Status: AC
  Administered 2022-05-14: 40 meq via ORAL
  Filled 2022-05-14: qty 2

## 2022-05-14 MED ORDER — EMPAGLIFLOZIN 10 MG PO TABS
10.0000 mg | ORAL_TABLET | Freq: Every day | ORAL | Status: DC
  Administered 2022-05-15 – 2022-05-16 (×2): 10 mg via ORAL
  Filled 2022-05-14 (×2): qty 1

## 2022-05-14 NOTE — ED Notes (Signed)
ED TO INPATIENT HANDOFF REPORT  ED Nurse Name and Phone #: Inda Merlin [phone]   S Name/Age/Gender Larry [name] 72 y.o. male Room/Bed: 025C/025C  Code Status   Code Status: Full Code  Home/SNF/Other Home Patient oriented to: self, place, time, and situation Is this baseline? Yes   Triage Complete: Triage complete  Chief Complaint Localized swelling of both lower legs [R22.43]  Triage Note Pt to the ed from home with a CC of lower leg swelling x 3 days with increased weakness over the last few days as well. Pt relays legs started weeping 3 days ago and he has had increased pain and inability to walk. Pt denies cp, sob, fever, chills, dizziness, loc    Allergies No Known Allergies  Level of Care/Admitting Diagnosis ED Disposition     ED Disposition  Admit   Condition  --   Comment  Hospital Area: [place] [[id number]]  Level of Care: Med-Surg [16]  May admit patient to [facility] or [facility] if equivalent level of care is available:: No  Covid Evaluation: Asymptomatic - no recent exposure (last 10 days) testing not required  Diagnosis: Localized swelling of both lower legs [id number]  Admitting Physician: Sid [name] [[id number]]  Attending Physician: Sid [name] [id number]  Certification:: I certify this patient will need inpatient services for at least 2 midnights  Estimated Length of Stay: 5          B Medical/Surgery History Past Medical History:  Diagnosis Date   Arthritis    Atrial tachycardia    CAD in native artery    Childhood asthma    "went away after I was 16"   Chronic combined systolic and diastolic CHF, NYHA class 3 (HCC)    CKD (chronic kidney disease) stage 3, GFR 30-59 ml/min ([place])    Colon cancer ([place]) 2015   MSI high; IHC loss of MLH1 and PMS2; BRAF negative; Negative methylation   COPD (chronic obstructive pulmonary disease) ([place])    Coronary artery disease    Family history of ovarian cancer     Hypercholesteremia    Hypertension    Noncompliance    NSVT (nonsustained ventricular tachycardia) (HCC)    Obesity    OSA on CPAP    used nightly, pt does not know settings   S/P colostomy ([place])    2014   Thoracic aortic aneurysm (HCC)    4.2 cm thoracic aortic aneurysm per chest ct 11-14-15 epic   Thrombocytopenia ([place])    Torsades de pointes ([place])    X 2 episodes during hospital visit 12'14"electrolyte imbalance"- "Shocked"   Past Surgical History:  Procedure Laterality Date   COLON SURGERY     COLONOSCOPY N/A 02/08/2013   Procedure: COLONOSCOPY;  Surgeon: [name], MD;  Location: [facility] ENDOSCOPY;  Service: Endoscopy;  Laterality: N/A;   COLONOSCOPY WITH PROPOFOL N/A 05/09/2014   Procedure: COLONOSCOPY WITH PROPOFOL;  Surgeon: [name], MD;  [facility];  Service: Endoscopy;  Laterality: N/A;   COLONOSCOPY WITH PROPOFOL N/A 01/08/2016   Procedure: COLONOSCOPY WITH PROPOFOL;  Surgeon: [name], MD;  [facility];  Service: Endoscopy;  Laterality: N/A;   COLONOSCOPY WITH PROPOFOL N/A 12/28/2018   Procedure: COLONOSCOPY WITH PROPOFOL;  Surgeon: [name], MD;  [facility];  Service: Endoscopy;  Laterality: N/A;   COLOSTOMY N/A 04/19/2016   Procedure: COLOSTOMY;  Surgeon: Judeth Horn, MD;  Location: [place];  Service: General;  Laterality: N/A;   COLOSTOMY  REVERSAL  04/12/2016   COLOSTOMY REVERSAL N/A 04/12/2016   Procedure: COLOSTOMY REVERSAL;  Surgeon: Judeth Horn, MD;  Location: [place];  Service: General;  Laterality: N/A;   CORONARY ANGIOPLASTY WITH STENT PLACEMENT  11/12/2008; 06/11/2014   stent x 2 to RCA; stent x 2 to LAD   CORONARY ANGIOPLASTY WITH STENT PLACEMENT  06/11/2014   m-LAD 3.5 x 16 mm Synergy DES, d-LAD  2.25 x 16 mm Synergy DES   CYSTOSCOPY W/ URETERAL STENT PLACEMENT Left 06/30/2017   Procedure: CYSTOSCOPY WITH RETROGRADE PYELOGRAM/URETERAL STENT PLACEMENT;  Surgeon: Lucas Mallow, MD;  Location: [place];  Service:  Urology;  Laterality: Left;   CYSTOSCOPY WITH LITHOLAPAXY N/A 08/02/2017   Procedure: CYSTOSCOPY WITH BLADDER STONE EXTRACTION;  Surgeon: Lucas Mallow, MD;  [facility];  Service: Urology;  Laterality: N/A;   CYSTOSCOPY WITH RETROGRADE PYELOGRAM, URETEROSCOPY AND STENT PLACEMENT Left 08/02/2017   Procedure: CYSTOSCOPY WITH LEFT RETROGRADE PYELOGRAM, URETEROSCOPY AND STENT EXCHANGE;  Surgeon: Lucas Mallow, MD;  [facility];  Service: Urology;  Laterality: Left;   ESOPHAGOGASTRODUODENOSCOPY N/A 02/08/2013   Procedure: ESOPHAGOGASTRODUODENOSCOPY (EGD);  Surgeon: [name], MD;  Location: [facility] ENDOSCOPY;  Service: Endoscopy;  Laterality: N/A;   FLEXIBLE SIGMOIDOSCOPY N/A 02/19/2016   Procedure: FLEXIBLE SIGMOIDOSCOPY;  Surgeon: [name], MD;  [facility];  Service: Endoscopy;  Laterality: N/A;   HOLMIUM LASER APPLICATION Left [id number]   Procedure: HOLMIUM LASER APPLICATION;  Surgeon: Lucas Mallow, MD;  [facility];  Service: Urology;  Laterality: Left;   LAPAROTOMY N/A 02/12/2013   Procedure: EXPLORATORY LAPAROTOMY PARTIAL COLECTOMY WITH COLOSTOMY;  Surgeon: [name], MD;  Location: [place];  Service: General;  Laterality: N/A;   LAPAROTOMY N/A 02/18/2013   Procedure: EXPLORATORY LAPAROTOMY/Closure of Wound;  Surgeon: [name], MD;  Location: [place];  Service: General;  Laterality: N/A;   LAPAROTOMY N/A 04/19/2016   Procedure: EXPLORATORY LAPAROTOMY, REPAIR OF ANASTAMOTIC LEAK;  Surgeon: Judeth Horn, MD;  Location: [place];  Service: General;  Laterality: N/A;   LEFT HEART CATHETERIZATION WITH CORONARY ANGIOGRAM N/A 06/11/2014   Procedure: LEFT HEART CATHETERIZATION WITH CORONARY ANGIOGRAM;  Surgeon: [name], MD; CFX calcified, 30-40 percent, RCA calcified, 40/50/40%, PDA diffuse disease, LAD 40/75/90% s/p DES 2    POLYPECTOMY  12/28/2018   Procedure: POLYPECTOMY;  Surgeon: [name], MD;  [facility];  Service: Endoscopy;;      A IV Location/Drains/Wounds Patient Lines/Drains/Airways Status     Active Line/Drains/Airways     Name Placement date Placement time Site Days   Peripheral IV 03/29/22 22 G 1" Left Antecubital 03/29/22  1025  Antecubital  46   Peripheral IV 05/14/22 20 G Left Antecubital 05/14/22  1735  Antecubital  less than 1   Peripheral IV 05/14/22 22 G Left Hand 05/14/22  1730  Hand  less than 1   Ileostomy Standard (end) RLQ 04/19/16  1500  RLQ  2216   Incision (Closed) 04/19/16 Abdomen Other (Comment) 04/19/16  1331  -- 2216   Wound / Incision (Open or Dehisced) 06/11/14 Other (Comment) 06/11/14  1035  --  2894            Intake/Output Last 24 hours No intake or output data in the 24 hours ending 05/14/22 2024  Labs/Imaging Results for orders placed or performed during the hospital encounter of 05/14/22 (from the past 48 hour(s))  Basic metabolic panel     Status: Abnormal   Collection  Time: 05/14/22  5:42 PM  Result Value Ref Range   Sodium 137 135 - 145 mmol/L   Potassium 3.1 (L) 3.5 - 5.1 mmol/L   Chloride 104 98 - 111 mmol/L   CO2 22 22 - 32 mmol/L   Glucose, Bld 104 (H) 70 - 99 mg/dL    Comment: Glucose reference range applies only to samples taken after fasting for at least 8 hours.   BUN 20 8 - 23 mg/dL   Creatinine, Ser 2.06 (H) 0.61 - 1.24 mg/dL   Calcium 9.5 8.9 - 10.3 mg/dL   GFR, Estimated 34 (L) >60 mL/min    Comment: (NOTE) Calculated using the CKD-EPI Creatinine Equation (2021)    Anion gap 11 5 - 15    Comment: Performed at [facility] [address] [place], [place] [address]  CBC with Differential/Platelet     Status: Abnormal   Collection Time: 05/14/22  5:42 PM  Result Value Ref Range   WBC 8.3 4.0 - 10.5 K/uL   RBC 4.15 (L) 4.22 - 5.81 MIL/uL   Hemoglobin 11.7 (L) 13.0 - 17.0 g/dL   HCT 37.6 (L) 39.0 - 52.0 %   MCV 90.6 80.0 - 100.0 fL   MCH 28.2 26.0 - 34.0 pg   MCHC 31.1 30.0 - 36.0 g/dL   RDW 14.4 11.5 - 15.5 %   Platelets 195 150 -  400 K/uL   nRBC 0.0 0.0 - 0.2 %   Neutrophils Relative % 82 %   Neutro Abs 6.7 1.7 - 7.7 K/uL   Lymphocytes Relative 8 %   Lymphs Abs 0.7 0.7 - 4.0 K/uL   Monocytes Relative 10 %   Monocytes Absolute 0.8 0.1 - 1.0 K/uL   Eosinophils Relative 0 %   Eosinophils Absolute 0.0 0.0 - 0.5 K/uL   Basophils Relative 0 %   Basophils Absolute 0.0 0.0 - 0.1 K/uL   Immature Granulocytes 0 %   Abs Immature Granulocytes 0.03 0.00 - 0.07 K/uL    Comment: Performed at [facility] [address], [place], [place] [address]  Magnesium     Status: None   Collection Time: 05/14/22  5:43 PM  Result Value Ref Range   Magnesium 2.2 1.7 - 2.4 mg/dL    Comment: Performed at [facility], [place] [address], [place], [place] [address]  Lactic acid, plasma     Status: None   Collection Time: 05/14/22  5:45 PM  Result Value Ref Range   Lactic Acid, Venous 1.4 0.5 - 1.9 mmol/L    Comment: Performed at [facility] [address] [place],  [address]   No results found.  Pending Labs Unresulted Labs (From admission, onward)     Start     Ordered   05/21/22 0500  Creatinine, serum  (enoxaparin (LOVENOX)    CrCl >/= 30 ml/min)  Weekly,   R     Comments: while on enoxaparin therapy    05/14/22 2015   05/14/22 2013  HIV Antibody (routine testing w rflx)  (HIV Antibody (Routine testing w reflex) panel)  Once,   R        05/14/22 2015   05/14/22 1731  Lactic acid, plasma  Now then every 2 hours,   R (with STAT occurrences)      05/14/22 1730   05/14/22 1720  Culture, blood (routine x 2)  BLOOD CULTURE X 2,   STAT  05/14/22 1720            Vitals/Pain Today's Vitals   05/14/22 1717 05/14/22 1718 05/14/22 1720 05/14/22 1730  BP:   127/70 (!) 109/54  Pulse:   98 (!) 101  Resp:   16 16  Temp:   97.8 F (36.6 C)   TempSrc:   Oral   SpO2:   100% 100%  Weight:  (!) 164 kg    Height:  6\' 1"  (1.854 m)    PainSc: 8        Isolation Precautions No active  isolations  Medications Medications  vancomycin (VANCOREADY) IVPB 1250 mg/250 mL (has no administration in time range)  potassium chloride SA (KLOR-CON M) CR tablet 40 mEq (has no administration in time range)  enoxaparin (LOVENOX) injection 40 mg (has no administration in time range)  cefTRIAXone (ROCEPHIN) 2 g in sodium chloride 0.9 % 100 mL IVPB (0 g Intravenous Stopped 05/14/22 1930)  morphine (PF) 4 MG/ML injection 4 mg (4 mg Intravenous Given 05/14/22 1826)  vancomycin (VANCOCIN) 2,500 mg in sodium chloride 0.9 % 500 mL IVPB (0 mg Intravenous Stopped 05/14/22 2024)  sodium chloride 0.9 % bolus 1,000 mL (0 mLs Intravenous Stopped 05/14/22 1930)    Mobility walks with device     Focused Assessments    R Recommendations: See Admitting Provider Note  Report given to:   Additional Notes: Sepsis, bilateral leg wounds. Alert, oriented, wife at bedside

## 2022-05-14 NOTE — ED Triage Notes (Signed)
Pt to the ed from home with a CC of lower leg swelling x 3 days with increased weakness over the last few days as well. Pt relays legs started weeping 3 days ago and he has had increased pain and inability to walk. Pt denies cp, sob, fever, chills, dizziness, loc

## 2022-05-14 NOTE — Progress Notes (Signed)
Pharmacy Antibiotic Note  Larry [name] is a 72 y.o. male admitted on 05/14/2022 with cellulitis.  Pharmacy has been consulted for vancomycin dosing. Scr near baseline at 2.06. Patient with HX of CKD stage 3.   Plan: Give IV Vancomycin 2500mg  x 1 for loading dose, followed by IV Vancomycin 1250 every 24 hours for eAUC421. Using Scr: 2.06, IBW, and Vd: 0.5.  Follow culture data for de-escalation.  Monitor renal function for dose adjustments as indicated.  Ceftriaxone per MD.   Height: 6\' 1"  (185.4 cm) Weight: (!) 164 kg (361 lb 8.9 oz) IBW/kg (Calculated) : 79.9  Temp (24hrs), Avg:97.8 F (36.6 C), Min:97.8 F (36.6 C), Max:97.8 F (36.6 C)  Recent Labs  Lab 05/14/22 1742 05/14/22 1745  WBC 8.3  --   CREATININE 2.06*  --   LATICACIDVEN  --  1.4    No Known Allergies  Thank you for allowing pharmacy to be a part of this patient's care.  Esmeralda Arthur, PharmD, BCCCP  05/14/2022 5:43 PM

## 2022-05-14 NOTE — ED Notes (Signed)
Pillow and adjusted in bed

## 2022-05-14 NOTE — Sepsis Progress Note (Signed)
Sepsis protocol is being followed by [facility]. 

## 2022-05-14 NOTE — H&P (Cosign Needed Addendum)
Date: 05/15/2022               Patient Name:  Larry [name] MRN: [id number]  DOB: 07-21-1950 Age / Sex: 72 y.o., male   PCP: Lin Landsman, MD         Medical Service: Internal Medicine Teaching Service         Attending Physician: Dr. Sid [name], MD    First Contact: Leigh Aurora, DO Pager: [phone]  Second Contact: Gaylan Gerold, DO Pager: Liliane Shi [phone]       After Hours (After 5p/  First Contact Pager: [phone]  weekends / holidays): Second Contact Pager: [phone]   SUBJECTIVE   Chief Complaint: leg swelling  History of Present Illness: Pt is a 72 yo male with pmhx significant for HTN, HLD, CAD, COPD, obesity, NSVT, CKD, CHF, colon cancer s/p resection and colostomy placement, and sleep apnea who presents with leg swelling and pain.  Reports swelling in his legs off and on for years. Had followed with wound care for some time but lost to follow up 2 years ago. Was told he had chronic venous insufficiency and was told to elevate legs. Unable to tolerate compression socks. Largely sedentary at home though able to walk to bathroom and bedroom unassisted. Endorses worsening of swelling and pain in his legs for the past couple of weeks and this prompted his visit to hospital. Pain is located in the bottom of right foot and also right lower leg. Left leg is sore as well. States he has not taken off socks in quite some time and has not washed his feet in months. Also has chronic knee pain bilaterally. Denies fever or chills. Denies numbness, tingling, or loss of sensation. Denies purulent discharge.  Denies procedures ever being done for reduced blood flow or venous insufficiency in legs.  ED Course: Afebrile. Tachycardic. WBC 8.3. Lactic acid 1.4. Received IV ceftriaxone and vancomycin. Morphine for pain. 1L IVF per sepsis protocol. Received K+  40 meq.  Meds:  Tamsulosin Jardiance Aspirin Atorvastatin Diltazem Lasix Losartan Albuterol PRN Metoprolol Nitroglycerin  PRN  PMHx CKD stg III 4 cardiac stents (2010, 2016) Ischemic cardiomyopathy HTN HLD Aortic atherosclerosis CHF, NYHA class III, EF 40 to 45% 03/29/22 Colon cancer s/p colectomy (5-6 years ago) and colostomy bag COPD  Past Surgical History:  Procedure Laterality Date   COLON SURGERY     COLONOSCOPY N/A 02/08/2013   Procedure: COLONOSCOPY;  Surgeon: [name], MD;  Location: [facility];  Service: Endoscopy;  Laterality: N/A;   COLONOSCOPY WITH PROPOFOL N/A 05/09/2014   Procedure: COLONOSCOPY WITH PROPOFOL;  Surgeon: [name], MD;  Location: [facility];  Service: Endoscopy;  Laterality: N/A;   COLONOSCOPY WITH PROPOFOL N/A 01/08/2016   Procedure: COLONOSCOPY WITH PROPOFOL;  Surgeon: [name], MD;  Location: [facility];  Service: Endoscopy;  Laterality: N/A;   COLONOSCOPY WITH PROPOFOL N/A 12/28/2018   Procedure: COLONOSCOPY WITH PROPOFOL;  Surgeon: [name], MD;  Location: [facility];  Service: Endoscopy;  Laterality: N/A;   COLOSTOMY N/A 04/19/2016   Procedure: COLOSTOMY;  Surgeon: Judeth Horn, MD;  Location: [place];  Service: General;  Laterality: N/A;   COLOSTOMY REVERSAL  04/12/2016   COLOSTOMY REVERSAL N/A 04/12/2016   Procedure: COLOSTOMY REVERSAL;  Surgeon: Judeth Horn, MD;  Location: [place];  Service: General;  Laterality: N/A;   CORONARY ANGIOPLASTY WITH STENT PLACEMENT  11/12/2008; 06/11/2014   stent x 2 to RCA; stent x 2 to LAD   CORONARY ANGIOPLASTY WITH STENT  PLACEMENT  06/11/2014   m-LAD 3.5 x 16 mm Synergy DES, d-LAD  2.25 x 16 mm Synergy DES   CYSTOSCOPY W/ URETERAL STENT PLACEMENT Left 06/30/2017   Procedure: CYSTOSCOPY WITH RETROGRADE PYELOGRAM/URETERAL STENT PLACEMENT;  Surgeon: Lucas Mallow, MD;  Location: [facility];  Service: Urology;  Laterality: Left;   CYSTOSCOPY WITH LITHOLAPAXY N/A 08/02/2017   Procedure: CYSTOSCOPY WITH BLADDER STONE EXTRACTION;  Surgeon: Lucas Mallow, MD;  Location: [facility];  Service: Urology;  Laterality: N/A;    CYSTOSCOPY WITH RETROGRADE PYELOGRAM, URETEROSCOPY AND STENT PLACEMENT Left 08/02/2017   Procedure: CYSTOSCOPY WITH LEFT RETROGRADE PYELOGRAM, URETEROSCOPY AND STENT EXCHANGE;  Surgeon: Lucas Mallow, MD;  Location: [facility];  Service: Urology;  Laterality: Left;   ESOPHAGOGASTRODUODENOSCOPY N/A 02/08/2013   Procedure: ESOPHAGOGASTRODUODENOSCOPY (EGD);  Surgeon: [name], MD;  Location: [facility];  Service: Endoscopy;  Laterality: N/A;   FLEXIBLE SIGMOIDOSCOPY N/A 02/19/2016   Procedure: FLEXIBLE SIGMOIDOSCOPY;  Surgeon: [name], MD;  Location[facility];  Service: Endoscopy;  Laterality: N/A;   HOLMIUM LASER APPLICATION Left [id number]   Procedure: HOLMIUM LASER APPLICATION;  Surgeon: Lucas Mallow, MD;  Location: [facility];  Service: Urology;  Laterality: Left;   LAPAROTOMY N/A 02/12/2013   Procedure: EXPLORATORY LAPAROTOMY PARTIAL COLECTOMY WITH COLOSTOMY;  Surgeon: [name], MD;  Location: [place];  Service: General;  Laterality: N/A;   LAPAROTOMY N/A 02/18/2013   Procedure: EXPLORATORY LAPAROTOMY/Closure of Wound;  Surgeon: [name], MD;  Location: [place];  Service: General;  Laterality: N/A;   LAPAROTOMY N/A 04/19/2016   Procedure: EXPLORATORY LAPAROTOMY, REPAIR OF ANASTAMOTIC LEAK;  Surgeon: Judeth Horn, MD;  Location: [place];  Service: General;  Laterality: N/A;   LEFT HEART CATHETERIZATION WITH CORONARY ANGIOGRAM N/A 06/11/2014   Procedure: LEFT HEART CATHETERIZATION WITH CORONARY ANGIOGRAM;  Surgeon: [name], MD; CFX calcified, 30-40 percent, RCA calcified, 40/50/40%, PDA diffuse disease, LAD 40/75/90% s/p DES 2    POLYPECTOMY  12/28/2018   Procedure: POLYPECTOMY;  Surgeon: [name], MD;  Location: [facility];  Service: Endoscopy;;    Social:  Lives With: wife at home Occupation: retired, prior Biochemist, clinical Support: wife Level of Function: able to perform iADL/ADL. Wife helps with billing PCP: Dr. Ayesha Rumpf Substances: tobacco use 72 y/o -  2010 1 ppd, prior alcohol use 1 pint of liquor for 20 years. Denies alcohol withdrawal. No other substances    Family History:  Father - MI, CHF, DMII Mother - "heart murmur" Sister - no known health issues  Allergies: Allergies as of 05/14/2022   (No Known Allergies)    Review of Systems: A complete ROS was negative except as per HPI.   OBJECTIVE:   Physical Exam: Blood pressure (!) 109/54, pulse (!) 101, temperature 97.8 F (36.6 C), temperature source Oral, resp. rate 16, height 6\' 1"  (1.854 m), weight (!) 164 kg, SpO2 100 %.  Constitutional: chronically ill-appearing male sitting in hospital bed, in no acute distress HENT: normocephalic atraumatic, mucous membranes moist Eyes: conjunctiva non-erythematous Neck: supple Cardiovascular: tachycardic, irregular rhythm, no m/r/g, 2+ radial pulses, unable to palpate DP pulses Pulmonary/Chest: normal work of breathing on room air, lungs clear to auscultation bilaterally Abdominal: soft, non-tender, non-distended, colostomy bag with normal stool contents GU: Erythema and tenderness to palpation at scrotum. Skin intact without purulence. MSK: normal bulk and tone Neurological: alert & oriented x 3. Sensation to light touch intact at bilateral feet. Skin: See media. Bilateral distal lower extremities with lichenification  and yellow slough at soles and shins. Impressions with some overlying erythema at ankles after removal of socks. Right dorsal foot with shallow ulcer with pink granulation tissue at base, indurated darkened margin. No crepitus or purulent discharge. Malodorous. Mild tenderness to palpation. Feet cooler to touch than more proximal leg. Psych: Normal mood and affect.  Labs: CBC    Component Value Date/Time   WBC 8.3 05/14/2022 1742   RBC 4.15 (L) 05/14/2022 1742   HGB 11.7 (L) 05/14/2022 1742   HGB 13.0 11/23/2020 1413   HGB 13.1 11/12/2015 1151   HCT 37.6 (L) 05/14/2022 1742   HCT 39.1 11/23/2020 1413   HCT 40.9  11/12/2015 1151   PLT 195 05/14/2022 1742   PLT 176 11/23/2020 1413   MCV 90.6 05/14/2022 1742   MCV 87 11/23/2020 1413   MCV 88.2 11/12/2015 1151   MCH 28.2 05/14/2022 1742   MCHC 31.1 05/14/2022 1742   RDW 14.4 05/14/2022 1742   RDW 14.0 11/23/2020 1413   RDW 15.3 (H) 11/12/2015 1151   LYMPHSABS 0.7 05/14/2022 1742   LYMPHSABS 1.5 11/12/2015 1151   MONOABS 0.8 05/14/2022 1742   MONOABS 0.8 11/12/2015 1151   EOSABS 0.0 05/14/2022 1742   EOSABS 0.4 11/12/2015 1151   BASOSABS 0.0 05/14/2022 1742   BASOSABS 0.1 11/12/2015 1151     CMP     Component Value Date/Time   NA 137 05/14/2022 1742   NA 143 08/30/2021 1530   NA 141 03/31/2014 1135   K 3.1 (L) 05/14/2022 1742   K 4.3 03/31/2014 1135   CL 104 05/14/2022 1742   CO2 22 05/14/2022 1742   CO2 23 03/31/2014 1135   GLUCOSE 104 (H) 05/14/2022 1742   GLUCOSE 96 03/31/2014 1135   BUN 20 05/14/2022 1742   BUN 30 (H) 08/30/2021 1530   BUN 25.5 03/31/2014 1135   CREATININE 2.06 (H) 05/14/2022 1742   CREATININE 1.72 (H) 06/29/2015 1416   CREATININE 1.9 (H) 03/31/2014 1135   CALCIUM 9.5 05/14/2022 1742   CALCIUM 9.8 03/31/2014 1135   PROT 7.3 11/23/2020 1413   PROT 7.4 03/31/2014 1135   ALBUMIN 4.7 11/23/2020 1413   ALBUMIN 3.9 03/31/2014 1135   AST 16 11/23/2020 1413   AST 17 03/31/2014 1135   ALT 20 11/23/2020 1413   ALT 18 03/31/2014 1135   ALKPHOS 147 (H) 11/23/2020 1413   ALKPHOS 100 03/31/2014 1135   BILITOT 1.1 11/23/2020 1413   BILITOT 1.27 (H) 03/31/2014 1135   GFRNONAA 34 (L) 05/14/2022 1742   GFRAA 31 (L) 01/01/2020 1049    Imaging: DG Tibia/Fibula Left Port  Result Date: 05/14/2022 CLINICAL DATA:  Lower extremity pain and swelling, initial encounter EXAM: PORTABLE LEFT TIBIA AND FIBULA - 2 VIEW COMPARISON:  None Available. FINDINGS: No acute fracture or dislocation is noted. Vascular calcifications are seen. Considerable subcutaneous edema is noted with some skin thickening similar to that seen on the  right. IMPRESSION: Subcutaneous edema and skin thickening similar to that seen on the right. No acute bony abnormality is noted. Electronically Signed   By: Inez Catalina M.D.   On: 05/14/2022 23:09   DG Tibia/Fibula Right Port  Result Date: 05/14/2022 CLINICAL DATA:  Right lower extremity pain and swelling, initial encounter EXAM: PORTABLE RIGHT TIBIA AND FIBULA - 2 VIEW COMPARISON:  None Available. FINDINGS: No acute fracture or dislocation is noted. Considerable subcutaneous edema is noted. Some skin thickening is noted as well. IMPRESSION: Subcutaneous edema and skin thickening.  No bony  abnormality noted. Electronically Signed   By: Inez Catalina M.D.   On: 05/14/2022 23:06   DG Foot Complete Left  Result Date: 05/14/2022 CLINICAL DATA:  Lower extremity swelling EXAM: LEFT FOOT - COMPLETE 3+ VIEW COMPARISON:  None Available. FINDINGS: Generalized soft tissue swelling is noted. Tarsal degenerative changes and calcaneal spurring is seen. No acute fracture or dislocation is noted. IMPRESSION: Generalized soft tissue swelling. No acute bony abnormality is seen. Electronically Signed   By: Inez Catalina M.D.   On: 05/14/2022 23:05   DG Foot Complete Right  Result Date: 05/14/2022 CLINICAL DATA:  Ulcerated swollen right foot. EXAM: RIGHT FOOT COMPLETE - 3+ VIEW COMPARISON:  None Available. FINDINGS: There is severe diffuse soft tissue edema. Lateral view appears to demonstrate shallow ulcerations over the top of the midfoot and deeper ulceration involving 1 of the distal toes possibly the second or third toe. Posteriorly there is a broad-based soft tissue defect over the dorsal heel area. The bones are osteopenic. There is no evidence of fractures. No destructive bone lesion is evident. There is moderate midfoot arthrosis. Tarsometatarsal bridging osteophytes are noted on the lateral view superiorly and enthesopathic changes of the fifth metatarsal base. There is enthesopathic spurring of the plantar  calcaneus. There are scattered  vascular calcifications in the distal foreleg. IMPRESSION: 1. Severe diffuse soft tissue edema. 2. Osteopenia and midfoot arthrosis. 3. Deep ulceration involving 1 of the distal toes, possibly the second or third toe but only well seen on the lateral view. Additional dorsal heel ulceration. 4. No destructive bone lesion is seen. 5. Vascular calcifications. Electronically Signed   By: Telford Nab M.D.   On: 05/14/2022 21:34     ASSESSMENT & PLAN:   Assessment & Plan by Problem: Principal Problem:   Localized swelling of both lower legs   Larry [name] is a 72 y.o. person living with a history of venous insufficiency, HTN, HLD, CAD, COPD, obesity, NSVT, CKD, CHF, colon cancer s/p resection and colostomy placement, and sleep apnea who presented with leg swelling and pain and was admitted for worsening bilateral lower extremity edema and inability to perform ADLs/iADLs on hospital day 1.  #Venous Insufficiency Venous stasis dermatitis with ulceration Presentation consistent with worsening of chronic venous stasis dermatitis now with ulceration in the setting of inability to wear compression socks, poor hygiene and lack of wound care follow up. Do not suspect sepsis given afebrile, not hypotensive, and tachycardia likely 2/2 lack of evening medications for known ectopic atrial tachycardia. No signs of end organ dysfunction, normal lactate. Additionally, suspicion is low for lower extremity infection given absence of systemic signs of infection and lack of warmth, significant tenderness and erythema, or purulence on exam. Do not believe progression of edema is a result of renal failure or heart failure (see below) but instead suspect venous stasis as etiology. Bilateral lower extremity XR with evidence of soft tissue swelling, ulcers at right dorsal foot and toes but no bony changes. History of abnormal ABIs, will repeat. -consult [facility]  -consider podiatry referral  while inpatient for possible debridement of ulcers -will consider foot soaks upon further evaluation of ulcers -discontinue abx  -blood cultures pending -ABIs pending -PT/OT  #CHF, NYHA class III Ischemic cardiomyopathy Echo 03/29/22 with EF 40 to 45%. Patient adherent to GDMT. Cardiology recently discontinued spironolactone given adequate EF. Do not suspect heart failure exacerbation given absence of dyspnea and normal lung sounds on exam.  -restart home Jardiance 10 mg daily -restart home losartan  25 mg daily -restart home Lasix 60 mg daily -restart home diltiazem 60 mg every 8 hours -restart metoprolol 37.5 twice daily  #Ectopic atrial tachycardia Hx NSVT Initial ECG with sinus tachycardia and PVCs. -restart home diltiazem 60 mg every 8 hours  #HTN Mildly hypertensive. Seems to be well controlled at home/previous OVs. Has not had his antihypertensives since this morning.  -restart home losartan 25 mg daily -restart home Lasix 60 mg daily -restart home diltiazem 60 mg every 8 hours -restart metoprolol 37.5 twice daily  #CAD, s/p stent x4 HLD Denies shortness of breath or chest pain.  -restart home Lipitor 40 mg daily -restart home aspirin 81 mg daily  #CKD stage III sCr 2.06 today, consistent with baseline. GFR 34. Seen by Dr. Candiss Norse outpatient. -continue losartan and jardiance -daily BMP  #COPD Stable. Denies shortness of breath. Lungs clear to auscultation. -restart home albuterol prn  #Colon cancer s/p colectomy (5-6 years ago) and colostomy bag #Chronic iron deficiency anemia -ferrous sulfate 325 mg daily -outpatient coordination of colon cancer surveillance by PCP  #Irritant dermatitis, scrotum Patient reports burning and tenderness at scrotum. Erythema on exam consistent with irritation from skin friction. -vaseline gel  #Hypokalemia K+ 3.1. Received 40 meq potassium chloride. -daily BMP  Diet: Heart Healthy VTE: Enoxaparin IVF: None,None Code:  Full  Prior to Admission Living Arrangement:  home with wife Anticipated Discharge Location: Home Barriers to Discharge: medical management  Dispo: Admit patient to Observation with expected length of stay less than 2 midnights.

## 2022-05-14 NOTE — ED Provider Notes (Signed)
[facility] Provider Note   CSN: [id number] Arrival date & time: 05/14/22  1709     History  Chief Complaint  Patient presents with   Leg Swelling    Larry [name] is a 72 y.o. male.  Pt is a 72 yo male with pmhx significant for HTN, HLD, CAD, COPD, obesity, NSVT, CKD, CHF, colon cancer s/p resection and colostomy placement, and sleep apnea.  Pt said he's had chronic swelling in both legs, but his legs have been more swollen and have been hurting him more.  His legs started draining 3 days ago.  Pt said he's not been able to walk and has not eaten since yesterday. No fevers.       Home Medications Prior to Admission medications   Medication Sig Start Date End Date Taking? Authorizing Provider  aspirin EC 81 MG tablet Take 1 tablet (81 mg total) by mouth daily. 03/06/13   Love, Ivan Anchors, PA-C  atorvastatin (LIPITOR) 40 MG tablet TAKE 1 TABLET BY MOUTH EVERY DAY 12/06/21   Baldwin Jamaica, PA-C  diltiazem (CARDIZEM) 60 MG tablet TAKE 1 & 1/2 TABLET BY MOUTH 3 TIMES DAILY. 03/24/21   Allred, Jeneen Rinks, MD  empagliflozin (JARDIANCE) 10 MG TABS tablet Take 1 tablet (10 mg total) by mouth daily before breakfast. 12/24/21   Early Osmond, MD  ferrous sulfate 325 (65 FE) MG tablet Take 1 tablet (325 mg total) by mouth daily with breakfast. 01/15/14   Burtis Junes, NP  furosemide (LASIX) 40 MG tablet Take 1.5 tablets (60 mg total) by mouth daily. 03/23/22   Early Osmond, MD  levalbuterol (XOPENEX HFA) 45 MCG/ACT inhaler TAKE 2 PUFFS BY MOUTH EVERY 4 HOURS AS NEEDED FOR WHEEZE 12/11/20   Allred, Jeneen Rinks, MD  losartan (COZAAR) 25 MG tablet Take 25 mg by mouth daily.    [provider]  metoprolol tartrate (LOPRESSOR) 25 MG tablet TAKE 1.5 TABLETS (37.5 MG TOTAL) BY MOUTH 2 (TWO) TIMES DAILY. 03/24/21   Allred, Jeneen Rinks, MD  Multiple Vitamin (MULTI-VITAMIN DAILY) TABS Take 1 tablet by mouth daily.    [provider]   nitroGLYCERIN (NITROSTAT) 0.4 MG SL tablet Place 0.4 mg under the tongue every 5 (five) minutes as needed for chest pain.    [provider]  simethicone (MYLICON) 0000000 MG chewable tablet Chew 125 mg by mouth 3 (three) times daily.    [provider]  tamsulosin (FLOMAX) 0.4 MG CAPS capsule TAKE 1 CAPSULE BY MOUTH EVERY DAY 10/22/21   Baldwin Jamaica, PA-C      Allergies    Patient has no known allergies.    Review of Systems   Review of Systems  Musculoskeletal:        Bilateral leg pain  All other systems reviewed and are negative.   Physical Exam Updated Vital Signs BP (!) 109/54   Pulse (!) 101   Temp 97.8 F (36.6 C) (Oral)   Resp 16   Ht 6\' 1"  (1.854 m)   Wt (!) 164 kg   SpO2 100%   BMI 47.70 kg/m  Physical Exam Vitals and nursing note reviewed.  Constitutional:      Appearance: Normal appearance. He is obese.  HENT:     Head: Normocephalic and atraumatic.     Right Ear: External ear normal.     Left Ear: External ear normal.     Nose: Nose normal.     Mouth/Throat:  Mouth: Mucous membranes are dry.  Eyes:     Extraocular Movements: Extraocular movements intact.     Conjunctiva/sclera: Conjunctivae normal.     Pupils: Pupils are equal, round, and reactive to light.  Cardiovascular:     Rate and Rhythm: Regular rhythm. Tachycardia present.     Pulses: Normal pulses.     Heart sounds: Normal heart sounds.  Pulmonary:     Effort: Pulmonary effort is normal.     Breath sounds: Normal breath sounds.  Abdominal:     General: Abdomen is flat. Bowel sounds are normal.     Palpations: Abdomen is soft.  Musculoskeletal:        General: Normal range of motion.     Cervical back: Normal range of motion and neck supple.  Skin:    General: Skin is warm.     Capillary Refill: Capillary refill takes less than 2 seconds.     Comments: See pictures.  When socks removed, multiple layers of dead skin were also removed.  It does not appear socks or  flip flops have been changed in some time.  Socks and sweatpants were adhered to skin and required removal with scissors and saline.  Indentation around ankle was from his socks.  Sores on top of feet were from flip flop.  Neurological:     General: No focal deficit present.     Mental Status: He is alert and oriented to person, place, and time.  Psychiatric:        Mood and Affect: Mood normal.        Behavior: Behavior normal.            ED Results / Procedures / Treatments   Labs (all labs ordered are listed, but only abnormal results are displayed) Labs Reviewed  BASIC METABOLIC PANEL - Abnormal; Notable for the following components:      Result Value   Potassium 3.1 (*)    Glucose, Bld 104 (*)    Creatinine, Ser 2.06 (*)    GFR, Estimated 34 (*)    All other components within normal limits  CBC WITH DIFFERENTIAL/PLATELET - Abnormal; Notable for the following components:   RBC 4.15 (*)    Hemoglobin 11.7 (*)    HCT 37.6 (*)    All other components within normal limits  CULTURE, BLOOD (ROUTINE X 2)  LACTIC ACID, PLASMA  MAGNESIUM  LACTIC ACID, PLASMA    EKG None  Radiology No results found.  Procedures Procedures    Medications Ordered in ED Medications  vancomycin (VANCOCIN) 2,500 mg in sodium chloride 0.9 % 500 mL IVPB (2,500 mg Intravenous New Bag/Given 05/14/22 1818)  vancomycin (VANCOREADY) IVPB 1250 mg/250 mL (has no administration in time range)  potassium chloride SA (KLOR-CON M) CR tablet 40 mEq (has no administration in time range)  cefTRIAXone (ROCEPHIN) 2 g in sodium chloride 0.9 % 100 mL IVPB (0 g Intravenous Stopped 05/14/22 1930)  morphine (PF) 4 MG/ML injection 4 mg (4 mg Intravenous Given 05/14/22 1826)  sodium chloride 0.9 % bolus 1,000 mL (0 mLs Intravenous Stopped 05/14/22 1930)    ED Course/ Medical Decision Making/ A&P                             Medical Decision Making Amount and/or Complexity of Data Reviewed Labs:  ordered.  Risk Prescription drug management. Decision regarding hospitalization.   This patient presents to the ED for concern of leg  pain, this involves an extensive number of treatment options, and is a complaint that carries with it a high risk of complications and morbidity.  The differential diagnosis includes cellulitis   Co morbidities that complicate the patient evaluation  HTN, HLD, CAD, COPD, obesity, NSVT, CKD, CHF, colon cancer s/p resection and colostomy placement, and sleep apnea.   Additional history obtained:  Additional history obtained from epic chart review External records from outside source obtained and reviewed including EMS report   Lab Tests:  I Ordered, and personally interpreted labs.  The pertinent results include:  cbc with chronic anemia, bmp with ckd and hypokalemia (k 3.1); lactic 1.4  Cardiac Monitoring:  The patient was maintained on a cardiac monitor.  I personally viewed and interpreted the cardiac monitored which showed an underlying rhythm of: st   Medicines ordered and prescription drug management:  I ordered medication including vancomycin and rocephin  for cellulitis  Reevaluation of the patient after these medicines showed that the patient improved I have reviewed the patients home medicines and have made adjustments as needed Critical Interventions:  abx   Consultations Obtained:  I requested consultation with the [facility],  and discussed lab and imaging findings as well as pertinent plan - they will admit  Problem List / ED Course:  BLE cellulitis:  vanc and rocephin given.  Pt will need wound care. Hypokalemia:  kdur given   Reevaluation:  After the interventions noted above, I reevaluated the patient and found that they have :improved   Social Determinants of Health:  Lives at home   Dispostion:  After consideration of the diagnostic results and the patients response to treatment, I feel that the patent would  benefit from admission.          Final Clinical Impression(s) / ED Diagnoses Final diagnoses:  Cellulitis of right lower extremity  Cellulitis of left lower extremity  Stage 3b chronic kidney disease ([place])  Hypokalemia    Rx / DC Orders ED Discharge Orders     None         Isla Pence, MD 05/14/22 [phone]

## 2022-05-15 ENCOUNTER — Encounter (HOSPITAL_COMMUNITY): Payer: Medicare Other

## 2022-05-15 ENCOUNTER — Encounter (HOSPITAL_COMMUNITY): Payer: Self-pay | Admitting: Internal Medicine

## 2022-05-15 ENCOUNTER — Inpatient Hospital Stay (HOSPITAL_COMMUNITY): Payer: Medicare Other

## 2022-05-15 LAB — BLOOD CULTURE ID PANEL (REFLEXED) - BCID2

## 2022-05-15 LAB — BASIC METABOLIC PANEL
Anion gap: 8 (ref 5–15)
BUN: 16 mg/dL (ref 8–23)
CO2: 22 mmol/L (ref 22–32)
Calcium: 8.8 mg/dL — ABNORMAL LOW (ref 8.9–10.3)
Chloride: 109 mmol/L (ref 98–111)
Creatinine, Ser: 1.83 mg/dL — ABNORMAL HIGH (ref 0.61–1.24)
GFR, Estimated: 39 mL/min — ABNORMAL LOW (ref >60)
Glucose, Bld: 103 mg/dL — ABNORMAL HIGH (ref 70–99)
Potassium: 3 mmol/L — ABNORMAL LOW (ref 3.5–5.1)
Sodium: 139 mmol/L (ref 135–145)

## 2022-05-15 MED ORDER — ALBUTEROL SULFATE HFA 108 (90 BASE) MCG/ACT IN AERS: 1.0000 | INHALATION_SPRAY | Freq: Four times a day (QID) | RESPIRATORY_TRACT | Status: DC | PRN

## 2022-05-15 MED ORDER — LACTATED RINGERS IV BOLUS
500.0000 mL | Freq: Once | INTRAVENOUS | Status: AC
  Administered 2022-05-15: 500 mL via INTRAVENOUS

## 2022-05-15 MED ORDER — POLYETHYLENE GLYCOL 3350 17 G PO PACK
17.0000 g | PACK | Freq: Every day | ORAL | Status: DC
  Administered 2022-05-16 – 2022-05-20 (×5): 17 g via ORAL
  Filled 2022-05-15 (×6): qty 1

## 2022-05-15 MED ORDER — OXYCODONE HCL 5 MG PO TABS
10.0000 mg | ORAL_TABLET | Freq: Three times a day (TID) | ORAL | Status: DC | PRN
  Administered 2022-05-15 – 2022-05-17 (×5): 10 mg via ORAL
  Filled 2022-05-15 (×5): qty 2

## 2022-05-15 MED ORDER — ENOXAPARIN SODIUM 80 MG/0.8ML IJ SOSY
80.0000 mg | PREFILLED_SYRINGE | INTRAMUSCULAR | Status: DC
  Administered 2022-05-15 – 2022-05-16 (×2): 80 mg via SUBCUTANEOUS
  Filled 2022-05-15 (×2): qty 0.8

## 2022-05-15 MED ORDER — IOHEXOL 350 MG/ML SOLN
75.0000 mL | Freq: Once | INTRAVENOUS | Status: AC | PRN
  Administered 2022-05-15: 75 mL via INTRAVENOUS

## 2022-05-15 MED ORDER — ALBUTEROL SULFATE (2.5 MG/3ML) 0.083% IN NEBU: 2.5000 mg | INHALATION_SOLUTION | Freq: Four times a day (QID) | RESPIRATORY_TRACT | Status: DC | PRN

## 2022-05-15 MED ORDER — ORAL CARE MOUTH RINSE: 15.0000 mL | OROMUCOSAL | Status: DC | PRN

## 2022-05-15 MED ORDER — LORAZEPAM 0.5 MG PO TABS
0.5000 mg | ORAL_TABLET | Freq: Once | ORAL | Status: AC
  Administered 2022-05-16: 0.5 mg via ORAL
  Filled 2022-05-15: qty 1

## 2022-05-15 MED ORDER — SODIUM CHLORIDE 0.9 % IV SOLN
2.0000 g | INTRAVENOUS | Status: DC
  Administered 2022-05-15 – 2022-05-17 (×3): 2 g via INTRAVENOUS
  Filled 2022-05-15 (×3): qty 20

## 2022-05-15 MED ORDER — POTASSIUM CHLORIDE CRYS ER 20 MEQ PO TBCR
40.0000 meq | EXTENDED_RELEASE_TABLET | Freq: Two times a day (BID) | ORAL | Status: AC
  Administered 2022-05-15 (×2): 40 meq via ORAL
  Filled 2022-05-15 (×2): qty 2

## 2022-05-15 NOTE — Hospital Course (Addendum)
Larry [name] is a 72 y.o. with a pertinent PMH of venous insufficiency, hypertension, hyperlipidemia, CAD, COPD who presents with concerns of right lower extremity pain and swelling.  Concern for sepsis, patient had blood cultures which resulted positive for Proteus.  Patient mated for further evaluation and management of Proteus bacteremia.   #Sepsis #Proteus bacteremia Emergency room blood cultures resulted positive for Proteus bacteremia today.  Patient started on ceftriaxone.  Patient asked about urinary symptoms, which patient denies.  On exam, there is necrotic tissue noted around colostomy bag, which could be possible etiology.  On exam, patient also has ulceration noted to dorsal aspect of right lower extremity and right heel.  Other etiologies could include urine, as well as right lower extremity wounds.  Patient remains hemodynamically stable at this moment.  No concern for distributive shock.  Will evaluate today with UA, and imaging. -Ceftriaxone day 1 -Follow-up MRI right foot with contrast -Follow-up CT abdomen pelvis with contrast -Monitor fever curve -Monitor white count -Follow-up UA with culture -Continue to monitor cultures and follow sensitivities   #Venous insufficiency #Venous stasis dermatitis with ulceration Significant chronic vascular changes noted to bilateral lower extremities.  There is some purulent drainage noted to right lower extremity on dorsal aspect and heel.  Will need wound care to evaluate patient. -ABIs pending -Wound care consult placed -Potentially will place inpatient podiatry consult after wound care evaluation -PT/OT consulted   #CHF #NYHA Class III #Ischemic cardiomyopathy Patient has a past medical history of heart failure with reduced ejection fraction.  Most recent EF 40 to 45%.  No acute concerns for exacerbation at this time.  Consider changing patient to metoprolol succinate during hospitalization. -Continue home Jardiance 10 mg  daily -Continue home losartan 25 mg daily -Continue home Lasix 60 mg daily -Continue home diltiazem 60 mg every 8 hours -Continue home metoprolol 37.5 mg twice daily   #Hypertension Blood pressure measuring well during hospitalization.  No acute concerns. -Continue losartan 25 mg daily -Continue Lasix 60 mg daily -Continue diltiazem 60 mg every 8 hours -Continue metoprolol tartrate 37.5 mg twice daily   #CKD stage IIIb Patient has a history of CKD stage IIIb.  Creatinine around baseline at this time.  Will get imaging with contrast, anticipate some bump in creatinine.  Will try to flush kidneys well. -Monitor kidney function   #COPD No acute concerns for exacerbations. -Continue home albuterol as needed   #Colon cancer status post colostomy  Patient reports she has changed her colostomy bag about last week.  On exam, patient does have darkening around colostomy site.  Unclear if this is necrotic tissue or dried stool.  Could be contributing to current presentation. -Wound care following   #Hypokalemia Will replete potassium and recheck in morning.  Mag was normal. -BMP pending  3/28 Worked with pt, sore during this Didn't make it to the side of the bed, all laying down Pain is in both legs R pain is on dorsal foot up shin half way L is on the medial shin Sharp pain, harder than pins and needles No burning Only there when he gets pressure on it Says he is drinking but not eating because no appetite 1 shasta, 1 cup of water per day Never had diabetes Doesn't remember a podiatrist in the past Fine with updating his wife  3/30 5-6/10 pain Drinking, not eating bc no appetite only ate graham crackers Thinks its because of the hospital food No CP, no tachy/palpitations

## 2022-05-15 NOTE — Progress Notes (Signed)
   05/14/22 2220  Assess: MEWS Score  Level of Consciousness Alert  Assess: MEWS Score  MEWS Temp 0  MEWS Systolic 0  MEWS Pulse 2  MEWS RR 0  MEWS LOC 0  MEWS Score 2  MEWS Score Color Yellow  Assess: if the MEWS score is Yellow or Red  Were vital signs taken at a resting state? Yes  Focused Assessment No change from prior assessment  Does the patient meet 2 or more of the SIRS criteria? No  Does the patient have a confirmed or suspected source of infection? Yes  Provider and Rapid Response Notified? Yes  MEWS guidelines implemented  Yes, yellow  Treat  MEWS Interventions Considered administering scheduled or prn medications/treatments as ordered  Take Vital Signs  Increase Vital Sign Frequency  Yellow: Q2hr x1, continue Q4hrs until patient remains green for 12hrs  Escalate  MEWS: Escalate Yellow: Discuss with charge nurse and consider notifying provider and/or RRT  Provider Notification  Provider Name/Title Dr. Dema Severin  Date Provider Notified 05/14/22  Method of Notification Page  Notification Reason Change in status  Provider response No new orders  Date of Provider Response 05/14/22   Patient in Yellow MEWs due to elevated heart rate.  Yellow MEWS Protocol implemented.  VS are otherwise stable.  Dr. Dema Severin made aware.  No additional orders.  Will continue to monitor patient.  Earleen Reaper RN

## 2022-05-15 NOTE — Evaluation (Signed)
Occupational Therapy Evaluation Patient Details Name: Larry [name] MRN: [id number] DOB: 07/08/1950 Today's Date: 05/15/2022   History of Present Illness Patient is a 72 yo male presenting to the ED with BLE swelling, increased weakness and pain with an inability to walk on 05/14/22. Admitted same day with cellulitis; R dorsal foot wound, bil ankle indentations at sock cuffs;  PMH includes: HTN, HLD, CAD, COPD, obesity, NSVT, CKD, CHF, colon cancer s/p resection and colostomy placement, and sleep apnea   Clinical Impression   Prior to this admission, patient Lives at home with wife, in a two-level home (stays downstairs) with 1 step to enter; Patient reports was typically able to walk household distances and manage ADLs independently, leading up to 2-3 weeks before admission, when he began to feel more weak, more RLE pain, became unable to walk short distances without pain, and was having difficulty managing his ostomy care. Currently, patient with with generalized weakness, Bil LE pain (R foot and ankle worse than L), ROM limitation due to body habitus, skin thickening/hyperkeratosis and skin openings bil LEs;  On Eval, Pt very hesitant to move, but agreeable after discussing rationale for moving and beginning work on mobility and ADLs; needed 2 person Total assist to support LEs coming off the bed and trunk coming up to sit; difficulty with anterior lean to find sitting balance today, hesitation to shift weight onto feet contributory. Patient is max A for his ADL management, and while patient admits he was able to reach his feet, per wounds on BLEs, it appears he has had a fair amount of difficulty with this in recent weeks/months.   Patient would greatly benefit from intensive therapies, ostomy and wound care, and education to return to prior level of independence. Patient was able to ambulate and complete self care prior to the progressive weakness, and with encouragement, is willing to  participate and progress in his overall care. OT will continue to follow acutely to address functional deficits.      Recommendations for follow up therapy are one component of a multi-disciplinary discharge planning process, led by the attending physician.  Recommendations may be updated based on patient status, additional functional criteria and insurance authorization.   Follow Up Recommendations  Acute inpatient rehab (3hours/day)     Assistance Recommended at Discharge Frequent or constant Supervision/Assistance  Patient can return home with the following Two people to help with walking and/or transfers;A lot of help with bathing/dressing/bathroom;Assist for transportation;Help with stairs or ramp for entrance    Functional Status Assessment  Patient has had a recent decline in their functional status and demonstrates the ability to make significant improvements in function in a reasonable and predictable amount of time.  Equipment Recommendations  Other (comment) (Will continue to assess)    Recommendations for Other Services       Precautions / Restrictions Precautions Precautions: Fall Restrictions Weight Bearing Restrictions: No Other Position/Activity Restrictions: Very painful with LE movement, touching down to floor; did not tolerate weight bearing on 3/24      Mobility Bed Mobility Overal bed mobility: Needs Assistance Bed Mobility: Supine to Sit, Sit to Supine     Supine to sit: +2 for physical assistance, Total assist Sit to supine: +2 for physical assistance, Total assist   General bed mobility comments: Hesitant to move; Agrees to try with encouragement and discussing rationale for attempting mobiltiy/ADLs; Pt made heavy use of berails and UE suppor;t Requires one person to fully support lower legs with bed pad coming  off and returning to bed; Second person needed for assist heavy support for trunk coming to upright and returning to supine; needs support at back  for entirety of both transitions    Transfers                          Balance Overall balance assessment: Needs assistance Sitting-balance support: Bilateral upper extremity supported, Feet supported, Feet unsupported (Portion of time sitting EOB with feet unsupported; Portion of time with feet support on floor minimally) Sitting balance-Leahy Scale: Zero Sitting balance - Comments: Max assist and support at back to maintain sitting; Pt hesitant to shift forward in sitting; hesitation at pushing feet into floor likely a large component of posterior lean; limitations with hip flexion and trunkal limitation also contribute Postural control: Posterior lean (Heavy)                                 ADL either performed or assessed with clinical judgement   ADL Overall ADL's : Needs assistance/impaired Eating/Feeding: Set up;Sitting   Grooming: Set up;Bed level   Upper Body Bathing: Moderate assistance;Bed level   Lower Body Bathing: Maximal assistance;Total assistance;Bed level   Upper Body Dressing : Moderate assistance;Bed level   Lower Body Dressing: Maximal assistance;Total assistance;Sit to/from stand;Sitting/lateral leans     Toilet Transfer Details (indicate cue type and reason): did not attempt in session Toileting- Clothing Manipulation and Hygiene: Maximal assistance;Bed level       Functional mobility during ADLs: Moderate assistance;+2 for physical assistance;+2 for safety/equipment;Cueing for sequencing;Cueing for safety;Rolling walker (2 wheels) General ADL Comments: Patient presenting with pain in BLEs, wounds on BLEs, and need for significant increased assist in order to complete ADLs and attempt bed mobility     Vision Baseline Vision/History: 0 No visual deficits Ability to See in Adequate Light: 0 Adequate Patient Visual Report: No change from baseline       Perception     Praxis      Pertinent Vitals/Pain Pain Assessment Pain  Assessment: 0-10 Pain Score: 5  Pain Location: R leg Pain Descriptors / Indicators: Discomfort, Grimacing, Guarding Pain Intervention(s): Monitored during session, Limited activity within patient's tolerance, Repositioned     Hand Dominance Right   Extremity/Trunk Assessment Upper Extremity Assessment Upper Extremity Assessment: Generalized weakness       Cervical / Trunk Assessment Cervical / Trunk Assessment: Other exceptions (increased body habitus)   Communication Communication Communication: No difficulties   Cognition Arousal/Alertness: Awake/alert Behavior During Therapy: WFL for tasks assessed/performed Overall Cognitive Status: Within Functional Limits for tasks assessed                                       General Comments  Session conducted on Room Air with No acute distress noted; Pt very hesitant to move; Skin thickening Bil LEs/lower legs, with indentations and skin openings noted at where cuffs of socks would be; wound dorsum of R foot; Pt had kept socks on for a long time leading up to this admission, and wore flip flops (R foot dorsal wound likely from rubbing flip flops)    Exercises     Shoulder Instructions      Home Living Family/patient expects to be discharged to:: Private residence Living Arrangements: Spouse/significant other Available Help at Discharge: Available 24 hours/day Type  of Home: House Home Access: Stairs to enter [employer] of Steps: 1 Entrance Stairs-Rails: None Home Layout: Full bath on main level;Bed/bath upstairs;Two level Alternate Level Stairs-Number of Steps: does not go upstairs   Bathroom Shower/Tub: Occupational psychologist: Handicapped height     Home Equipment: None          Prior Functioning/Environment Prior Level of Function : Independent/Modified Independent             Mobility Comments: independent ADLs Comments: bird baths at baseline, does not get in the  shower, from the look of his feet had socks on for a very long time        OT Problem List: Decreased strength;Decreased range of motion;Decreased activity tolerance;Impaired balance (sitting and/or standing);Decreased coordination;Decreased safety awareness;Decreased knowledge of use of DME or AE;Decreased knowledge of precautions;Impaired sensation;Obesity;Increased edema;Pain      OT Treatment/Interventions: Self-care/ADL training;Therapeutic exercise;Energy conservation;DME and/or AE instruction;Manual therapy;Therapeutic activities;Patient/family education;Balance training    OT Goals(Current goals can be found in the care plan section) Acute Rehab OT Goals Patient Stated Goal: to get all of this managed OT Goal Formulation: With patient Time For Goal Achievement: 05/29/22 Potential to Achieve Goals: Good  OT Frequency: Min 2X/week    Co-evaluation   Reason for Co-Treatment: For patient/therapist safety;To address functional/ADL transfers;Other (comment) (Pt-centered decision based on activity tolerance) PT goals addressed during session: Mobility/safety with mobility;Other (comment) (gross overview of skin integrity)        AM-PAC OT "6 Clicks" Daily Activity     Outcome Measure Help from another person eating meals?: None Help from another person taking care of personal grooming?: None Help from another person toileting, which includes using toliet, bedpan, or urinal?: A Lot Help from another person bathing (including washing, rinsing, drying)?: A Lot Help from another person to put on and taking off regular upper body clothing?: A Lot Help from another person to put on and taking off regular lower body clothing?: Total 6 Click Score: 15   End of Session Nurse Communication: Mobility status  Activity Tolerance: Patient limited by fatigue;Patient limited by lethargy;Patient limited by pain Patient left: in bed;with bed alarm set;with call bell/phone within reach  OT Visit  Diagnosis: Unsteadiness on feet (R26.81);Other abnormalities of gait and mobility (R26.89);Muscle weakness (generalized) (M62.81);Pain Pain - Right/Left: Right Pain - part of body: Leg                Time: [id number] OT Time Calculation (min): 37 min Charges:  OT General Charges $OT Visit: 1 Visit OT Evaluation $OT Eval Moderate Complexity: 1 Mod  Corinne Ports E. Wendle Kina, OTR/L Acute Rehabilitation Services [phone]   Ascencion Dike 05/15/2022, 4:22 PM

## 2022-05-15 NOTE — Progress Notes (Signed)
HD#1 Subjective:   Summary: This is a 72 year old male with a past medical history of venous insufficiency, hypertension, hyperlipidemia, CAD who presents with worsening lower extremity edema and pain.  Patient admitted for sepsis, found to have Proteus bacteremia, and admitted for further evaluation and management.  Overnight Events: No overnight events  Patient states he is doing well.  He states he is still having leg pain.  Patient denies any hematuria, dysuria, or any urinary concerns.  He denies any other wounds besides the wounds on his feet.  He denies any shortness of breath.  Objective:  Vital signs in last 24 hours: Vitals:   05/14/22 2000 05/14/22 2213 05/15/22 0008 05/15/22 0408  BP: (!) 155/85 (!) 148/75 (!) 107/54 118/60  Pulse: (!) 111 (!) 115 (!) 113 98  Resp: 16 20 18 16   Temp:  98.2 F (36.8 C) 99 F (37.2 C) 99.2 F (37.3 C)  TempSrc:  Oral Oral Oral  SpO2: 100% 100% 95% 98%  Weight:      Height:       Supplemental O2: Room Air SpO2: 98 %   Physical Exam:  Constitutional: Resting in bed, no acute distress HENT: normocephalic atraumatic Eyes: conjunctiva non-erythematous Cardiovascular: regular rate and rhythm, no m/r/g Pulmonary/Chest: normal work of breathing on room air, lungs clear to auscultation bilaterally Abdominal: Soft, nontender, concern for necrotic tissue around colostomy site.  No tenderness at colostomy site.  Colostomy bag with liquid stool present Extremities: Chronic venous stasis skin changes appreciated with very dry skin noted bilaterally up to mid shin.  There is wound noted to right dorsal aspect of right lower extremity as well as right heel with purulence noted.  Foul-smelling odor.  Filed Weights   05/14/22 1718  Weight: (!) 164 kg     Intake/Output Summary (Last 24 hours) at 05/15/2022 0550 Last data filed at 05/15/2022 0415 Gross per 24 hour  Intake 360 ml  Output 0 ml  Net 360 ml   Net IO Since Admission: 600 mL  [05/15/22 0550]  Pertinent Labs:    Latest Ref Rng & Units 05/14/2022    5:42 PM 11/23/2020    2:13 PM 01/01/2020   10:49 AM  CBC  WBC 4.0 - 10.5 K/uL 8.3  6.2  5.5   Hemoglobin 13.0 - 17.0 g/dL 11.7  13.0  12.8   Hematocrit 39.0 - 52.0 % 37.6  39.1  38.8   Platelets 150 - 400 K/uL 195  176  133        Latest Ref Rng & Units 05/14/2022    5:42 PM 08/30/2021    3:30 PM 11/23/2020    2:13 PM  CMP  Glucose 70 - 99 mg/dL 104  95  94   BUN 8 - 23 mg/dL 20  30  23    Creatinine 0.61 - 1.24 mg/dL 2.06  2.60  2.33   Sodium 135 - 145 mmol/L 137  143  140   Potassium 3.5 - 5.1 mmol/L 3.1  4.2  4.1   Chloride 98 - 111 mmol/L 104  105  102   CO2 22 - 32 mmol/L 22  19  19    Calcium 8.9 - 10.3 mg/dL 9.5  10.2  10.0   Total Protein 6.0 - 8.5 g/dL   7.3   Total Bilirubin 0.0 - 1.2 mg/dL   1.1   Alkaline Phos 44 - 121 IU/L   147   AST 0 - 40 IU/L   16  ALT 0 - 44 IU/L   20     Imaging: DG Tibia/Fibula Left Port  Result Date: 05/14/2022 CLINICAL DATA:  Lower extremity pain and swelling, initial encounter EXAM: PORTABLE LEFT TIBIA AND FIBULA - 2 VIEW COMPARISON:  None Available. FINDINGS: No acute fracture or dislocation is noted. Vascular calcifications are seen. Considerable subcutaneous edema is noted with some skin thickening similar to that seen on the right. IMPRESSION: Subcutaneous edema and skin thickening similar to that seen on the right. No acute bony abnormality is noted. Electronically Signed   By: Inez Catalina M.D.   On: 05/14/2022 23:09   DG Tibia/Fibula Right Port  Result Date: 05/14/2022 CLINICAL DATA:  Right lower extremity pain and swelling, initial encounter EXAM: PORTABLE RIGHT TIBIA AND FIBULA - 2 VIEW COMPARISON:  None Available. FINDINGS: No acute fracture or dislocation is noted. Considerable subcutaneous edema is noted. Some skin thickening is noted as well. IMPRESSION: Subcutaneous edema and skin thickening.  No bony abnormality noted. Electronically Signed   By: Inez Catalina M.D.   On: 05/14/2022 23:06   DG Foot Complete Left  Result Date: 05/14/2022 CLINICAL DATA:  Lower extremity swelling EXAM: LEFT FOOT - COMPLETE 3+ VIEW COMPARISON:  None Available. FINDINGS: Generalized soft tissue swelling is noted. Tarsal degenerative changes and calcaneal spurring is seen. No acute fracture or dislocation is noted. IMPRESSION: Generalized soft tissue swelling. No acute bony abnormality is seen. Electronically Signed   By: Inez Catalina M.D.   On: 05/14/2022 23:05   DG Foot Complete Right  Result Date: 05/14/2022 CLINICAL DATA:  Ulcerated swollen right foot. EXAM: RIGHT FOOT COMPLETE - 3+ VIEW COMPARISON:  None Available. FINDINGS: There is severe diffuse soft tissue edema. Lateral view appears to demonstrate shallow ulcerations over the top of the midfoot and deeper ulceration involving 1 of the distal toes possibly the second or third toe. Posteriorly there is a broad-based soft tissue defect over the dorsal heel area. The bones are osteopenic. There is no evidence of fractures. No destructive bone lesion is evident. There is moderate midfoot arthrosis. Tarsometatarsal bridging osteophytes are noted on the lateral view superiorly and enthesopathic changes of the fifth metatarsal base. There is enthesopathic spurring of the plantar calcaneus. There are scattered  vascular calcifications in the distal foreleg. IMPRESSION: 1. Severe diffuse soft tissue edema. 2. Osteopenia and midfoot arthrosis. 3. Deep ulceration involving 1 of the distal toes, possibly the second or third toe but only well seen on the lateral view. Additional dorsal heel ulceration. 4. No destructive bone lesion is seen. 5. Vascular calcifications. Electronically Signed   By: Telford Nab M.D.   On: 05/14/2022 21:34    Assessment/Plan:   Principal Problem:   Localized swelling of both lower legs   Patient Summary: Larry [name] is a 72 y.o. with a pertinent PMH of venous insufficiency,  hypertension, hyperlipidemia, CAD, COPD who presents with concerns of right lower extremity pain and swelling.  Concern for sepsis, patient had blood cultures which resulted positive for Proteus.  Patient mated for further evaluation and management of Proteus bacteremia.  #Sepsis #Proteus bacteremia Emergency room blood cultures resulted positive for Proteus bacteremia today.  Patient started on ceftriaxone.  Patient asked about urinary symptoms, which patient denies.  On exam, there is necrotic tissue noted around colostomy bag, which could be possible etiology.  On exam, patient also has ulceration noted to dorsal aspect of right lower extremity and right heel.  Other etiologies could include urine, as well  as right lower extremity wounds.  Patient remains hemodynamically stable at this moment.  No concern for distributive shock.  Will evaluate today with UA, and imaging. -Ceftriaxone day 1 -Follow-up MRI right foot with contrast -Follow-up CT abdomen pelvis with contrast -Monitor fever curve -Monitor white count -Follow-up UA with culture -Continue to monitor cultures and follow sensitivities  #Venous insufficiency #Venous stasis dermatitis with ulceration Significant chronic vascular changes noted to bilateral lower extremities.  There is some purulent drainage noted to right lower extremity on dorsal aspect and heel.  Will need wound care to evaluate patient. -ABIs pending -Wound care consult placed -Potentially will place inpatient podiatry consult after wound care evaluation -PT/OT consulted  #CHF #NYHA Class III #Ischemic cardiomyopathy Patient has a past medical history of heart failure with reduced ejection fraction.  Most recent EF 40 to 45%.  No acute concerns for exacerbation at this time.  Consider changing patient to metoprolol succinate during hospitalization. -Continue home Jardiance 10 mg daily -Continue home losartan 25 mg daily -Continue home Lasix 60 mg  daily -Continue home diltiazem 60 mg every 8 hours -Continue home metoprolol 37.5 mg twice daily  #Hypertension Blood pressure measuring well during hospitalization.  No acute concerns. -Continue losartan 25 mg daily -Continue Lasix 60 mg daily -Continue diltiazem 60 mg every 8 hours -Continue metoprolol tartrate 37.5 mg twice daily  #CKD stage IIIb Patient has a history of CKD stage IIIb.  Creatinine around baseline at this time.  Will get imaging with contrast, anticipate some bump in creatinine.  Will try to flush kidneys well. -Monitor kidney function  #COPD No acute concerns for exacerbations. -Continue home albuterol as needed  #Colon cancer status post colostomy  Patient reports she has changed her colostomy bag about last week.  On exam, patient does have darkening around colostomy site.  Unclear if this is necrotic tissue or dried stool.  Could be contributing to current presentation. -Wound care following  #Hypokalemia Will replete potassium and recheck in morning.  Mag was normal. -BMP pending  Diet: Heart Healthy IVF: None,None VTE: Enoxaparin Code: Full PT/OT recs: Pending  Dispo: Anticipated discharge to Rehab in 3 days pending clinical improvement.   Leigh Aurora, DO Internal Medicine Resident PGY-1 [phone] Please contact the on call pager after 5 pm and on weekends at [phone].

## 2022-05-15 NOTE — Progress Notes (Signed)
Patient has abdominal scarring from surgery mid line scar above current illiostomy from previous colostomy

## 2022-05-15 NOTE — Progress Notes (Signed)
PHARMACY - PHYSICIAN COMMUNICATION CRITICAL VALUE ALERT - BLOOD CULTURE IDENTIFICATION (BCID)  Larry [name] is an 72 y.o. male who presented to [facility] on 05/14/2022 with a chief complaint of LLE edema, weakness.  Assessment:  46 yom found to have 1 of 2 blood cultures showing Proteus with no resistance noted. Last dose of ceftriaxone was 3/23 18:15.  Name of physician (or Provider) Contacted: Leigh Aurora  Current antibiotics: none  Changes to prescribed antibiotics recommended:  Resume ceftriaxone 2 g IV q24h  Results for orders placed or performed during the hospital encounter of 05/14/22  Blood Culture ID Panel (Reflexed) (Collected: 05/14/2022  5:30 PM)  Result Value Ref Range   Enterococcus faecalis NOT DETECTED NOT DETECTED   Enterococcus Faecium NOT DETECTED NOT DETECTED   Listeria monocytogenes NOT DETECTED NOT DETECTED   Staphylococcus species NOT DETECTED NOT DETECTED   Staphylococcus aureus (BCID) NOT DETECTED NOT DETECTED   Staphylococcus epidermidis NOT DETECTED NOT DETECTED   Staphylococcus lugdunensis NOT DETECTED NOT DETECTED   Streptococcus species NOT DETECTED NOT DETECTED   Streptococcus agalactiae NOT DETECTED NOT DETECTED   Streptococcus pneumoniae NOT DETECTED NOT DETECTED   Streptococcus pyogenes NOT DETECTED NOT DETECTED   A.calcoaceticus-baumannii NOT DETECTED NOT DETECTED   Bacteroides fragilis NOT DETECTED NOT DETECTED   Enterobacterales DETECTED (A) NOT DETECTED   Enterobacter cloacae complex NOT DETECTED NOT DETECTED   Escherichia coli NOT DETECTED NOT DETECTED   Klebsiella aerogenes NOT DETECTED NOT DETECTED   Klebsiella oxytoca NOT DETECTED NOT DETECTED   Klebsiella pneumoniae NOT DETECTED NOT DETECTED   Proteus species DETECTED (A) NOT DETECTED   Salmonella species NOT DETECTED NOT DETECTED   Serratia marcescens NOT DETECTED NOT DETECTED   Haemophilus influenzae NOT DETECTED NOT DETECTED   Neisseria meningitidis NOT DETECTED NOT DETECTED    Pseudomonas aeruginosa NOT DETECTED NOT DETECTED   Stenotrophomonas maltophilia NOT DETECTED NOT DETECTED   Candida albicans NOT DETECTED NOT DETECTED   Candida auris NOT DETECTED NOT DETECTED   Candida glabrata NOT DETECTED NOT DETECTED   Candida krusei NOT DETECTED NOT DETECTED   Candida parapsilosis NOT DETECTED NOT DETECTED   Candida tropicalis NOT DETECTED NOT DETECTED   Cryptococcus neoformans/gattii NOT DETECTED NOT DETECTED   CTX-M ESBL NOT DETECTED NOT DETECTED   Carbapenem resistance IMP NOT DETECTED NOT DETECTED   Carbapenem resistance KPC NOT DETECTED NOT DETECTED   Carbapenem resistance NDM NOT DETECTED NOT DETECTED   Carbapenem resist OXA 48 LIKE NOT DETECTED NOT DETECTED   Carbapenem resistance VIM NOT DETECTED NOT DETECTED    Thank you for involving pharmacy in this patient's care.  Renold Genta, PharmD, BCPS Clinical Pharmacist Clinical phone for 05/15/2022 is [phone] 05/15/2022 8:34 AM

## 2022-05-15 NOTE — Evaluation (Signed)
Physical Therapy Evaluation Patient Details Name: Larry [name] MRN: [id number] DOB: 07/23/1950 Today's Date: 05/15/2022  History of Present Illness  Patient is a 72 yo male presenting to the ED with BLE swelling, increased weakness and pain with an inability to walk on 05/14/22. Admitted same day with cellulitis; R dorsal foot wound, bil ankle indentations at sock cuffs;  PMH includes: HTN, HLD, CAD, COPD, obesity, NSVT, CKD, CHF, colon cancer s/p resection and colostomy placement, and sleep apnea  Clinical Impression   Pt admitted with above diagnosis. Lives at home with wife, in a two-level home (stays downstairs) with 1 step to enter; Prior to admission, pt reports was typically able to walk household distances and manage ADLs independently, leading up to 2-3 weeks before admission, when he began to feel more weak, more RLE pain, became unable to walk short distances without pain, and was having difficulty managing his ostomy care;   Presents to PT with generalized weakness, Bil LE pain (R foot and ankle worse than L), ROM limitation due to body habitus, skin thickening/hyperkeratosis and skin openings bil LEs;  On Eval, Pt very hesitant to move, but agreeable after discussing rationale for moving and beginning work on mobility and ADLs; needed 2 person Total assist to support LEs coming off the bed and trunk coming up to sit; difficulty with anterior lean to find sitting balance today, hesitation to shift weight onto feet contributory;    [name] needs post-acute rehab to maximize independence and safety with mobility and ADLs, and decr risk of readmissions; He was hesitant to move, but attempted every functional task we asked of him; He was grossly independent with household mobility and basic ADLs until the few weeks leading up to this admission; did not need an assistive device to walk; today, he is limited in activity tolerance, however, I anticipate with better pain control and  medical optimization he will be able to undergo more intensive rehab;  Worth considering AIR to get rehabilitative nursing education for skin care, wound care, and ostomy care in addition to PT and OT; Consider Rehab consult over the next week (Recommend waiting until pt is able to show progress with activity tolerance;   Pt currently with functional limitations due to the deficits listed below (see PT Problem List). Pt will benefit from skilled PT to increase their independence and safety with mobility to allow discharge to the venue listed below.          Recommendations for follow up therapy are one component of a multi-disciplinary discharge planning process, led by the attending physician.  Recommendations may be updated based on patient status, additional functional criteria and insurance authorization.  Follow Up Recommendations Acute inpatient rehab (3hours/day)  Anticipate with better pain control and medical optimization he will be able to undergo more intensive rehab;  Worth considering AIR to get rehabilitative nursing education for skin care, wound care, and ostomy care in addition to PT and OT    Assistance Recommended at Discharge Intermittent Supervision/Assistance  Patient can return home with the following  Two people to help with walking and/or transfers;Two people to help with bathing/dressing/bathroom;Assist for transportation;Help with stairs or ramp for entrance    Equipment Recommendations Rolling walker (2 wheels);BSC/3in1;Wheelchair (measurements PT);Wheelchair cushion (measurements PT) ([facility] and [facility]; consider 22-24 inch wide WC/cushion -- can consider hospital bed; will continue to discern based on progress with therapies)  Recommendations for Other Services  Rehab consult (help discern medical complexity/ anticipated progress)  Functional Status Assessment Patient has had a recent decline in their functional status and demonstrates the ability to make significant  improvements in function in a reasonable and predictable amount of time.     Precautions / Restrictions Precautions Precautions: Fall Restrictions Weight Bearing Restrictions: No Other Position/Activity Restrictions: Very painful with LE movement, touching down to floor; did not tolerate weight bearing on 3/24      Mobility  Bed Mobility Overal bed mobility: Needs Assistance Bed Mobility: Supine to Sit, Sit to Supine     Supine to sit: +2 for physical assistance, Total assist Sit to supine: +2 for physical assistance, Total assist   General bed mobility comments: Hesitant to move; Agrees to try with encouragement and discussing rationale for attempting mobiltiy/ADLs; Pt made heavy use of berails and UE suppor;t Requires one person to fully support lower legs with bed pad coming off and returning to bed; Second person needed for assist heavy support for trunk coming to upright and returning to supine; needs support at back for entirety of both transitions    Transfers                        Ambulation/Gait                  Stairs            Wheelchair Mobility    Modified Rankin (Stroke Patients Only)       Balance Overall balance assessment: Needs assistance Sitting-balance support: Bilateral upper extremity supported, Feet supported, Feet unsupported (Portion of time sitting EOB with feet unsupported; Portion of time with feet support on floor minimally) Sitting balance-Leahy Scale: Zero Sitting balance - Comments: Max assist and support at back to maintain sitting; Pt hesitant to shift forward in sitting; hesitation at pushing feet into floor likely a large component of posterior lean; limitations with hip flexion and trunkal limitation also contribute Postural control: Posterior lean (Heavy)                                   Pertinent Vitals/Pain Pain Assessment Pain Assessment: 0-10 Pain Score: 5  Pain Location: R leg Pain  Descriptors / Indicators: Discomfort, Grimacing, Guarding Pain Intervention(s): Monitored during session, Limited activity within patient's tolerance, Other (comment) (Notified RN of pain level)    Home Living Family/patient expects to be discharged to:: Private residence Living Arrangements: Spouse/significant other Available Help at Discharge: Available 24 hours/day Type of Home: House Home Access: Stairs to enter Entrance Stairs-Rails: None Entrance Stairs-Number of Steps: 1 Alternate Level Stairs-Number of Steps: does not go upstairs Home Layout: Full bath on main level;Bed/bath upstairs;Two level Home Equipment: None      Prior Function Prior Level of Function : Independent/Modified Independent             Mobility Comments: independent ADLs Comments: bird baths at baseline, does not get in the shower, from the look of his feet had socks on for a very long time     Hand Dominance   Dominant Hand: Right    Extremity/Trunk Assessment             Cervical / Trunk Assessment Cervical / Trunk Assessment: Other exceptions (increased body habitus)  Communication   Communication: No difficulties  Cognition Arousal/Alertness: Awake/alert Behavior During Therapy: WFL for tasks assessed/performed Overall Cognitive Status: Within Functional Limits for tasks assessed  General Comments General comments (skin integrity, edema, etc.): Session conducted on Room Air with No acute distress noted; Pt very hesitant to move; Skin thickening Bil LEs/lower legs, with indentations and skin openings noted at where cuffs of socks would be; wound dorsum of R foot; Pt had kept socks on for a long time leading up to this admission, and wore flip flops (R foot dorsal wound likely from rubbing flip flops)    Exercises     Assessment/Plan    PT Assessment Patient needs continued PT services  PT Problem List Decreased  strength;Decreased range of motion;Decreased activity tolerance;Decreased balance;Decreased mobility;Decreased coordination;Decreased knowledge of use of DME;Decreased safety awareness;Decreased knowledge of precautions;Obesity;Decreased skin integrity;Pain       PT Treatment Interventions DME instruction;Gait training;Functional mobility training;Therapeutic activities;Therapeutic exercise;Balance training;Patient/family education;Manual techniques;Modalities    PT Goals (Current goals can be found in the Care Plan section)  Acute Rehab PT Goals Patient Stated Goal: agrees that he must be able to do more to be able to get home PT Goal Formulation: With patient Time For Goal Achievement: 05/23/22 Potential to Achieve Goals: Good    Frequency Min 3X/week     Co-evaluation PT/OT/SLP Co-Evaluation/Treatment: Yes Reason for Co-Treatment: For patient/therapist safety;To address functional/ADL transfers;Other (comment) (Pt-centered decision based on activity tolerance) PT goals addressed during session: Mobility/safety with mobility;Other (comment) (gross overview of skin integrity)         AM-PAC PT "6 Clicks" Mobility  Outcome Measure Help needed turning from your back to your side while in a flat bed without using bedrails?: A Lot Help needed moving from lying on your back to sitting on the side of a flat bed without using bedrails?: Total Help needed moving to and from a bed to a chair (including a wheelchair)?: Total Help needed standing up from a chair using your arms (e.g., wheelchair or bedside chair)?: Total Help needed to walk in hospital room?: Total Help needed climbing 3-5 steps with a railing? : Total 6 Click Score: 7    End of Session Equipment Utilized During Treatment: Other (comment) (Heavy use of bed pads to suport BIL feet and lower legs) Activity Tolerance: Patient limited by pain (also perhaps anticipation of pain, but pt attempted all the tasks asked of  him) Patient left: in bed;with call bell/phone within reach;with bed alarm set Nurse Communication: Mobility status;Other (comment) (Discussed etiologies of wounds) PT Visit Diagnosis: Unsteadiness on feet (R26.81);Other abnormalities of gait and mobility (R26.89);Muscle weakness (generalized) (M62.81);History of falling (Z91.81);Pain;Other (comment) (decr skin integrity) Pain - Right/Left: Right Pain - part of body: Leg;Ankle and joints of foot (also soreness L LE)    Time: [id number] PT Time Calculation (min) (ACUTE ONLY): 38 min   Charges:   PT Evaluation $PT Eval Moderate Complexity: 1 Mod PT Treatments $Therapeutic Activity: 8-22 mins        Roney Marion, PT  Acute Rehabilitation Services Office [phone]   Colletta [place] 05/15/2022, 11:20 AM

## 2022-05-15 NOTE — Progress Notes (Signed)
Inpatient Rehab Admissions Coordinator Note:   Per PT patient was screened for CIR candidacy by Catharine Kettlewell Danford Bad, CCC-SLP. At this time, pt hs not yet attempted transfers. Pt may have potential to progress to becoming a potential CIR candidate. CIR admissions team will follow to monitor participation and progress with therapies as well as medical workup. If pt appears to be an appropriate candidate, a consult order will be placed.    Gayland Curry, [place], [place] Admissions Coordinator [phone] 05/15/22 2:55 PM

## 2022-05-16 ENCOUNTER — Inpatient Hospital Stay (HOSPITAL_COMMUNITY): Payer: Medicare Other

## 2022-05-16 ENCOUNTER — Encounter (HOSPITAL_COMMUNITY): Payer: Medicare Other

## 2022-05-16 DIAGNOSIS — B961 Klebsiella pneumoniae [K. pneumoniae] as the cause of diseases classified elsewhere: Secondary | ICD-10-CM

## 2022-05-16 DIAGNOSIS — R2243 Localized swelling, mass and lump, lower limb, bilateral: Secondary | ICD-10-CM | POA: Diagnosis not present

## 2022-05-16 DIAGNOSIS — R7881 Bacteremia: Secondary | ICD-10-CM | POA: Diagnosis not present

## 2022-05-16 DIAGNOSIS — L03115 Cellulitis of right lower limb: Secondary | ICD-10-CM

## 2022-05-16 DIAGNOSIS — L03116 Cellulitis of left lower limb: Secondary | ICD-10-CM | POA: Diagnosis not present

## 2022-05-16 LAB — CBC
HCT: 35 % — ABNORMAL LOW (ref 39.0–52.0)
Hemoglobin: 10.8 g/dL — ABNORMAL LOW (ref 13.0–17.0)
MCH: 27.7 pg (ref 26.0–34.0)
MCHC: 30.9 g/dL (ref 30.0–36.0)
MCV: 89.7 fL (ref 80.0–100.0)
Platelets: 203 10*3/uL (ref 150–400)
RBC: 3.9 MIL/uL — ABNORMAL LOW (ref 4.22–5.81)
RDW: 14.6 % (ref 11.5–15.5)
WBC: 5.5 10*3/uL (ref 4.0–10.5)
nRBC: 0 % (ref 0.0–0.2)

## 2022-05-16 LAB — BASIC METABOLIC PANEL
Anion gap: 10 (ref 5–15)
BUN: 18 mg/dL (ref 8–23)
CO2: 21 mmol/L — ABNORMAL LOW (ref 22–32)
Calcium: 9.3 mg/dL (ref 8.9–10.3)
Chloride: 107 mmol/L (ref 98–111)
Creatinine, Ser: 2.4 mg/dL — ABNORMAL HIGH (ref 0.61–1.24)
GFR, Estimated: 28 mL/min — ABNORMAL LOW (ref >60)
Glucose, Bld: 107 mg/dL — ABNORMAL HIGH (ref 70–99)
Potassium: 3.7 mmol/L (ref 3.5–5.1)
Sodium: 138 mmol/L (ref 135–145)

## 2022-05-16 MED ORDER — TRIAMCINOLONE 0.1 % CREAM:EUCERIN CREAM 1:1
TOPICAL_CREAM | Freq: Every day | CUTANEOUS | Status: DC
  Filled 2022-05-16 (×5): qty 1

## 2022-05-16 MED ORDER — LACTATED RINGERS IV BOLUS
500.0000 mL | Freq: Once | INTRAVENOUS | Status: AC
  Administered 2022-05-16: 500 mL via INTRAVENOUS

## 2022-05-16 MED ORDER — GADOBUTROL 1 MMOL/ML IV SOLN
10.0000 mL | Freq: Once | INTRAVENOUS | Status: AC | PRN
  Administered 2022-05-16: 10 mL via INTRAVENOUS

## 2022-05-16 NOTE — Progress Notes (Signed)
PT Cancellation Note  Patient Details Name: HEBERTO FERENZ MRN: [id number] DOB: 11/03/1950   Cancelled Treatment:    Reason Eval/Treat Not Completed: Patient at procedure or test/unavailable. Discussed pt case with RN who reports transport is en route to get pt for MRI. He also needs to have LE's redressed as they are unwrapped and open to air at this time. Will check back as schedule allows to continue with PT POC.    [name] 05/16/2022, 2:15 PM  Rolinda Roan, PT, DPT Acute Rehabilitation Services Secure Chat Preferred Office: [phone]

## 2022-05-16 NOTE — Consult Note (Signed)
Reason for Consult:BLE cellulitis Referring Physician: Lottie [name] Time called: [id number] Time at bedside: [name] is an 72 y.o. male.  HPI: Larry [name] was admitted yesterday with BLE cellulitis. He notes that he's been having increasing pain for about 3 weeks. He's unsure when he developed a new ulcer on his right foot. He denies fevers, chills, sweats, N/V. He has been treated by the wound care center in the past but has been taking care of himself for the last 2y.  Past Medical History:  Diagnosis Date   Arthritis    Atrial tachycardia    CAD in native artery    Childhood asthma    "went away after I was 16"   Chronic combined systolic and diastolic CHF, NYHA class 3 (HCC)    CKD (chronic kidney disease) stage 3, GFR 30-59 ml/min (HCC)    Colon cancer ([place]) 2015   MSI high; IHC loss of MLH1 and PMS2; BRAF negative; Negative methylation   COPD (chronic obstructive pulmonary disease) (HCC)    Coronary artery disease    Family history of ovarian cancer    Hypercholesteremia    Hypertension    Noncompliance    NSVT (nonsustained ventricular tachycardia) (HCC)    Obesity    OSA on CPAP    used nightly, pt does not know settings   S/P colostomy ([place])    2014   Thoracic aortic aneurysm (HCC)    4.2 cm thoracic aortic aneurysm per chest ct 11-14-15 epic   Thrombocytopenia ([place])    Torsades de pointes ([place])    X 2 episodes during hospital visit 12'14"electrolyte imbalance"- "Shocked"    Past Surgical History:  Procedure Laterality Date   COLON SURGERY     COLONOSCOPY N/A 02/08/2013   Procedure: COLONOSCOPY;  Surgeon: [name], MD;  Location: [facility] ENDOSCOPY;  Service: Endoscopy;  Laterality: N/A;   COLONOSCOPY WITH PROPOFOL N/A 05/09/2014   Procedure: COLONOSCOPY WITH PROPOFOL;  Surgeon: [name], MD;  Location: [facility] ENDOSCOPY;  Service: Endoscopy;  Laterality: N/A;   COLONOSCOPY WITH PROPOFOL N/A 01/08/2016   Procedure: COLONOSCOPY WITH PROPOFOL;  Surgeon:  [name], MD;  Location: [facility] ENDOSCOPY;  Service: Endoscopy;  Laterality: N/A;   COLONOSCOPY WITH PROPOFOL N/A 12/28/2018   Procedure: COLONOSCOPY WITH PROPOFOL;  Surgeon: [name], MD;  Location: [facility] ENDOSCOPY;  Service: Endoscopy;  Laterality: N/A;   COLOSTOMY N/A 04/19/2016   Procedure: COLOSTOMY;  Surgeon: Judeth Horn, MD;  Location: [place];  Service: General;  Laterality: N/A;   COLOSTOMY REVERSAL  04/12/2016   COLOSTOMY REVERSAL N/A 04/12/2016   Procedure: COLOSTOMY REVERSAL;  Surgeon: Judeth Horn, MD;  Location: [place];  Service: General;  Laterality: N/A;   CORONARY ANGIOPLASTY WITH STENT PLACEMENT  11/12/2008; 06/11/2014   stent x 2 to RCA; stent x 2 to LAD   CORONARY ANGIOPLASTY WITH STENT PLACEMENT  06/11/2014   m-LAD 3.5 x 16 mm Synergy DES, d-LAD  2.25 x 16 mm Synergy DES   CYSTOSCOPY W/ URETERAL STENT PLACEMENT Left 06/30/2017   Procedure: CYSTOSCOPY WITH RETROGRADE PYELOGRAM/URETERAL STENT PLACEMENT;  Surgeon: Lucas Mallow, MD;  Location: [place];  Service: Urology;  Laterality: Left;   CYSTOSCOPY WITH LITHOLAPAXY N/A 08/02/2017   Procedure: CYSTOSCOPY WITH BLADDER STONE EXTRACTION;  Surgeon: Lucas Mallow, MD;  Location: [facility] ORS;  Service: Urology;  Laterality: N/A;   CYSTOSCOPY WITH RETROGRADE PYELOGRAM, URETEROSCOPY AND STENT PLACEMENT Left 08/02/2017   Procedure: CYSTOSCOPY WITH LEFT RETROGRADE PYELOGRAM, URETEROSCOPY  AND STENT EXCHANGE;  Surgeon: Lucas Mallow, MD;  Location: [facility];  Service: Urology;  Laterality: Left;   ESOPHAGOGASTRODUODENOSCOPY N/A 02/08/2013   Procedure: ESOPHAGOGASTRODUODENOSCOPY (EGD);  Surgeon: [name], MD;  [facility];  Service: Endoscopy;  Laterality: N/A;   FLEXIBLE SIGMOIDOSCOPY N/A 02/19/2016   Procedure: FLEXIBLE SIGMOIDOSCOPY;  Surgeon: [name], MD;  Location[facility] ENDOSCOPY;  Service: Endoscopy;  Laterality: N/A;   HOLMIUM LASER APPLICATION Left [id number]   Procedure: HOLMIUM LASER APPLICATION;  Surgeon:  Lucas Mallow, MD;  Location: [facility];  Service: Urology;  Laterality: Left;   LAPAROTOMY N/A 02/12/2013   Procedure: EXPLORATORY LAPAROTOMY PARTIAL COLECTOMY WITH COLOSTOMY;  Surgeon: [name], MD;  Location: [place];  Service: General;  Laterality: N/A;   LAPAROTOMY N/A 02/18/2013   Procedure: EXPLORATORY LAPAROTOMY/Closure of Wound;  Surgeon: [name], MD;  Location: [place];  Service: General;  Laterality: N/A;   LAPAROTOMY N/A 04/19/2016   Procedure: EXPLORATORY LAPAROTOMY, REPAIR OF ANASTAMOTIC LEAK;  Surgeon: Judeth Horn, MD;  Location: [place];  Service: General;  Laterality: N/A;   LEFT HEART CATHETERIZATION WITH CORONARY ANGIOGRAM N/A 06/11/2014   Procedure: LEFT HEART CATHETERIZATION WITH CORONARY ANGIOGRAM;  Surgeon: [name], MD; CFX calcified, 30-40 percent, RCA calcified, 40/50/40%, PDA diffuse disease, LAD 40/75/90% s/p DES 2    POLYPECTOMY  12/28/2018   Procedure: POLYPECTOMY;  Surgeon: [name], MD;  Location[facility] ENDOSCOPY;  Service: Endoscopy;;    Family History  Problem Relation Age of Onset   Hypertension Father    Heart disease Father        before age 76   Diabetes Father    Hypertension Mother    Dementia Mother    Parkinsonism Mother    Liver cancer Maternal Grandmother        dx in her 83s   Prostate cancer Maternal Uncle    Leukemia Maternal Uncle     Social History:  reports that he quit smoking about 13 years ago. His smoking use included cigarettes. He has a 38.00 pack-year smoking history. He has never used smokeless tobacco. He reports current alcohol use. He reports that he does not use drugs.  Allergies: No Known Allergies  Medications: I have reviewed the patient's current medications.  Results for orders placed or performed during the hospital encounter of 05/14/22 (from the past 48 hour(s))  Culture, blood (routine x 2)     Status: Abnormal (Preliminary result)   Collection Time: 05/14/22  5:30 PM   Specimen: BLOOD  Result  Value Ref Range   Specimen Description BLOOD LEFT ANTECUBITAL    Special Requests      BOTTLES DRAWN AEROBIC AND ANAEROBIC Blood Culture results may not be optimal due to an inadequate volume of blood received in culture bottles   Culture  Setup Time      GRAM NEGATIVE RODS ANAEROBIC BOTTLE ONLY CRITICAL RESULT CALLED TO, READ BACK BY AND VERIFIED WITH: PHARMD JESSICA MILLEN [id number] AT 0821 BY EC    Culture (A)     PROTEUS MIRABILIS SUSCEPTIBILITIES TO FOLLOW Performed at [facility], [place] [address] [place], [place] [address]    Report Status PENDING   Blood Culture ID Panel (Reflexed)     Status: Abnormal   Collection Time: 05/14/22  5:30 PM  Result Value Ref Range   Enterococcus faecalis NOT DETECTED NOT DETECTED   Enterococcus Faecium NOT DETECTED NOT DETECTED   Listeria monocytogenes NOT DETECTED NOT DETECTED  Staphylococcus species NOT DETECTED NOT DETECTED   Staphylococcus aureus (BCID) NOT DETECTED NOT DETECTED   Staphylococcus epidermidis NOT DETECTED NOT DETECTED   Staphylococcus lugdunensis NOT DETECTED NOT DETECTED   Streptococcus species NOT DETECTED NOT DETECTED   Streptococcus agalactiae NOT DETECTED NOT DETECTED   Streptococcus pneumoniae NOT DETECTED NOT DETECTED   Streptococcus pyogenes NOT DETECTED NOT DETECTED   A.calcoaceticus-baumannii NOT DETECTED NOT DETECTED   Bacteroides fragilis NOT DETECTED NOT DETECTED   Enterobacterales DETECTED (A) NOT DETECTED    Comment: Enterobacterales represent a large order of gram negative bacteria, not a single organism. CRITICAL RESULT CALLED TO, READ BACK BY AND VERIFIED WITH: PHARMD JESSICA MILLEN [id number] AT [id number] BY EC    Enterobacter cloacae complex NOT DETECTED NOT DETECTED   Escherichia coli NOT DETECTED NOT DETECTED   Klebsiella aerogenes NOT DETECTED NOT DETECTED   Klebsiella oxytoca NOT DETECTED NOT DETECTED   Klebsiella pneumoniae NOT DETECTED NOT DETECTED   Proteus species DETECTED (A) NOT DETECTED     Comment: CRITICAL RESULT CALLED TO, READ BACK BY AND VERIFIED WITH: PHARMD JESSICA MILLEN [id number] AT 0821 BY EC    Salmonella species NOT DETECTED NOT DETECTED   Serratia marcescens NOT DETECTED NOT DETECTED   Haemophilus influenzae NOT DETECTED NOT DETECTED   Neisseria meningitidis NOT DETECTED NOT DETECTED   Pseudomonas aeruginosa NOT DETECTED NOT DETECTED   Stenotrophomonas maltophilia NOT DETECTED NOT DETECTED   Candida albicans NOT DETECTED NOT DETECTED   Candida auris NOT DETECTED NOT DETECTED   Candida glabrata NOT DETECTED NOT DETECTED   Candida krusei NOT DETECTED NOT DETECTED   Candida parapsilosis NOT DETECTED NOT DETECTED   Candida tropicalis NOT DETECTED NOT DETECTED   Cryptococcus neoformans/gattii NOT DETECTED NOT DETECTED   CTX-M ESBL NOT DETECTED NOT DETECTED   Carbapenem resistance IMP NOT DETECTED NOT DETECTED   Carbapenem resistance KPC NOT DETECTED NOT DETECTED   Carbapenem resistance NDM NOT DETECTED NOT DETECTED   Carbapenem resist OXA 48 LIKE NOT DETECTED NOT DETECTED   Carbapenem resistance VIM NOT DETECTED NOT DETECTED    Comment: Performed at [facility], [address] [place], [place] [id number]  Basic metabolic panel     Status: Abnormal   Collection Time: 05/14/22  5:42 PM  Result Value Ref Range   Sodium 137 135 - 145 mmol/L   Potassium 3.1 (L) 3.5 - 5.1 mmol/L   Chloride 104 98 - 111 mmol/L   CO2 22 22 - 32 mmol/L   Glucose, Bld 104 (H) 70 - 99 mg/dL    Comment: Glucose reference range applies only to samples taken after fasting for at least 8 hours.   BUN 20 8 - 23 mg/dL   Creatinine, Ser 2.06 (H) 0.61 - 1.24 mg/dL   Calcium 9.5 8.9 - 10.3 mg/dL   GFR, Estimated 34 (L) >60 mL/min    Comment: (NOTE) Calculated using the CKD-EPI Creatinine Equation (2021)    Anion gap 11 5 - 15    Comment: Performed at [place] [address] [place], [place] [address]  CBC with Differential/Platelet     Status: Abnormal   Collection  Time: 05/14/22  5:42 PM  Result Value Ref Range   WBC 8.3 4.0 - 10.5 K/uL   RBC 4.15 (L) 4.22 - 5.81 MIL/uL   Hemoglobin 11.7 (L) 13.0 - 17.0 g/dL   HCT 37.6 (L) 39.0 - 52.0 %   MCV 90.6 80.0 - 100.0 fL   MCH 28.2 26.0 -  34.0 pg   MCHC 31.1 30.0 - 36.0 g/dL   RDW 14.4 11.5 - 15.5 %   Platelets 195 150 - 400 K/uL   nRBC 0.0 0.0 - 0.2 %   Neutrophils Relative % 82 %   Neutro Abs 6.7 1.7 - 7.7 K/uL   Lymphocytes Relative 8 %   Lymphs Abs 0.7 0.7 - 4.0 K/uL   Monocytes Relative 10 %   Monocytes Absolute 0.8 0.1 - 1.0 K/uL   Eosinophils Relative 0 %   Eosinophils Absolute 0.0 0.0 - 0.5 K/uL   Basophils Relative 0 %   Basophils Absolute 0.0 0.0 - 0.1 K/uL   Immature Granulocytes 0 %   Abs Immature Granulocytes 0.03 0.00 - 0.07 K/uL    Comment: Performed at [place] [address] [place], [place] [address]  Magnesium     Status: None   Collection Time: 05/14/22  5:43 PM  Result Value Ref Range   Magnesium 2.2 1.7 - 2.4 mg/dL    Comment: Performed at [facility], [place] [address], [place], [place] [address]  Lactic acid, plasma     Status: None   Collection Time: 05/14/22  5:45 PM  Result Value Ref Range   Lactic Acid, Venous 1.4 0.5 - 1.9 mmol/L    Comment: Performed at [facility] [address], [place], [place] [address]  HIV Antibody (routine testing w rflx)     Status: None   Collection Time: 05/14/22  8:45 PM  Result Value Ref Range   HIV Screen 4th Generation wRfx Non Reactive Non Reactive    Comment: Performed at [facility], [place] [address], [place], [place] [id number]  Basic metabolic panel     Status: Abnormal   Collection Time: 05/15/22  6:04 AM  Result Value Ref Range   Sodium 139 135 - 145 mmol/L   Potassium 3.0 (L) 3.5 - 5.1 mmol/L   Chloride 109 98 - 111 mmol/L   CO2 22 22 - 32 mmol/L   Glucose, Bld 103 (H) 70 - 99 mg/dL    Comment: Glucose reference range applies only to samples taken after fasting for at least 8 hours.   BUN  16 8 - 23 mg/dL   Creatinine, Ser 1.83 (H) 0.61 - 1.24 mg/dL   Calcium 8.8 (L) 8.9 - 10.3 mg/dL   GFR, Estimated 39 (L) >60 mL/min    Comment: (NOTE) Calculated using the CKD-EPI Creatinine Equation (2021)    Anion gap 8 5 - 15    Comment: Performed at [facility] [address] [place], [place] [id number]  Basic metabolic panel     Status: Abnormal   Collection Time: 05/16/22  7:09 AM  Result Value Ref Range   Sodium 138 135 - 145 mmol/L   Potassium 3.7 3.5 - 5.1 mmol/L   Chloride 107 98 - 111 mmol/L   CO2 21 (L) 22 - 32 mmol/L   Glucose, Bld 107 (H) 70 - 99 mg/dL    Comment: Glucose reference range applies only to samples taken after fasting for at least 8 hours.   BUN 18 8 - 23 mg/dL   Creatinine, Ser 2.40 (H) 0.61 - 1.24 mg/dL   Calcium 9.3 8.9 - 10.3 mg/dL   GFR, Estimated 28 (L) >60 mL/min    Comment: (NOTE) Calculated using the CKD-EPI Creatinine Equation (2021)    Anion gap 10 5 - 15    Comment: Performed at [facility], [place] [address] [place], [place] [address]  CBC     Status: Abnormal   Collection Time: 05/16/22  7:09 AM  Result Value Ref Range   WBC 5.5 4.0 - 10.5 K/uL   RBC 3.90 (L) 4.22 - 5.81 MIL/uL   Hemoglobin 10.8 (L) 13.0 - 17.0 g/dL   HCT 35.0 (L) 39.0 - 52.0 %   MCV 89.7 80.0 - 100.0 fL   MCH 27.7 26.0 - 34.0 pg   MCHC 30.9 30.0 - 36.0 g/dL   RDW 14.6 11.5 - 15.5 %   Platelets 203 150 - 400 K/uL   nRBC 0.0 0.0 - 0.2 %    Comment: Performed at [facility], [place] [address] [place], [place] [address]    CT ABDOMEN PELVIS W CONTRAST  Result Date: 05/15/2022 CLINICAL DATA:  Sepsis EXAM: CT ABDOMEN AND PELVIS WITH CONTRAST TECHNIQUE: Multidetector CT imaging of the abdomen and pelvis was performed using the standard protocol following bolus administration of intravenous contrast. RADIATION DOSE REDUCTION: This exam was performed according to the departmental dose-optimization program which includes automated exposure control,  adjustment of the mA and/or kV according to patient size and/or use of iterative reconstruction technique. CONTRAST:  90mL OMNIPAQUE IOHEXOL 350 MG/ML SOLN IV. No oral contrast. COMPARISON:  None Available. FINDINGS: Lower chest: Minimal pericardial effusion. Pleural thickening at lung bases with LEFT basilar pleural calcifications. Hepatobiliary: Hepatic cysts. Contracted gallbladder with calcified gallstones. Single calcified gallstone in cystic duct. Liver otherwise unremarkable. Pancreas: Normal appearance Spleen: Normal appearance Adrenals/Urinary Tract: Adrenal glands normal appearance. BILATERAL renal cysts up to 5.6 cm RIGHT upper pole and 3.8 cm mid LEFT kidney, none appearing complicated; no follow-up imaging recommended. Tiny nonobstructing RIGHT renal calculi. Mild BILATERAL collecting system dilatation though this could be in part related to bladder distention. Large RIGHT-sided bladder diverticulum containing multiple calculi. Additional dependent calculi within main bladder lumen. No bladder wall thickening. Stomach/Bowel: Increased stool in rectum. Diverticulosis of descending and sigmoid colon without evidence of diverticulitis. Normal appendix. Ileostomy RIGHT mid abdomen. Anastomotic staple line mid transverse colon. Stomach and remaining bowel loops unremarkable. Vascular/Lymphatic: Atherosclerotic calcifications aorta, iliac arteries, coronary arteries. Aorta normal caliber. No adenopathy. Reproductive: Unremarkable prostate gland and seminal vesicles Other: No free air or free fluid. Redundant small bowel in the ostomy tract without obstruction. Musculoskeletal: Osseous demineralization. Bulging discs at multiple levels. IMPRESSION: Distal colonic diverticulosis without evidence of diverticulitis. Increased stool in rectum. Large RIGHT-sided bladder diverticulum containing multiple calculi. Mild BILATERAL collecting system dilatation though this could be in part related to bladder distention.  Tiny nonobstructing RIGHT renal calculi. Cholelithiasis with calcified stone in cystic duct. Aortic Atherosclerosis (ICD10-I70.0). Electronically Signed   By: Lavonia Dana M.D.   On: 05/15/2022 12:33   DG Tibia/Fibula Left Port  Result Date: 05/14/2022 CLINICAL DATA:  Lower extremity pain and swelling, initial encounter EXAM: PORTABLE LEFT TIBIA AND FIBULA - 2 VIEW COMPARISON:  None Available. FINDINGS: No acute fracture or dislocation is noted. Vascular calcifications are seen. Considerable subcutaneous edema is noted with some skin thickening similar to that seen on the right. IMPRESSION: Subcutaneous edema and skin thickening similar to that seen on the right. No acute bony abnormality is noted. Electronically Signed   By: Inez Catalina M.D.   On: 05/14/2022 23:09   DG Tibia/Fibula Right Port  Result Date: 05/14/2022 CLINICAL DATA:  Right lower extremity pain and swelling, initial encounter EXAM: PORTABLE RIGHT TIBIA AND FIBULA - 2 VIEW COMPARISON:  None Available.  FINDINGS: No acute fracture or dislocation is noted. Considerable subcutaneous edema is noted. Some skin thickening is noted as well. IMPRESSION: Subcutaneous edema and skin thickening.  No bony abnormality noted. Electronically Signed   By: Inez Catalina M.D.   On: 05/14/2022 23:06   DG Foot Complete Left  Result Date: 05/14/2022 CLINICAL DATA:  Lower extremity swelling EXAM: LEFT FOOT - COMPLETE 3+ VIEW COMPARISON:  None Available. FINDINGS: Generalized soft tissue swelling is noted. Tarsal degenerative changes and calcaneal spurring is seen. No acute fracture or dislocation is noted. IMPRESSION: Generalized soft tissue swelling. No acute bony abnormality is seen. Electronically Signed   By: Inez Catalina M.D.   On: 05/14/2022 23:05   DG Foot Complete Right  Result Date: 05/14/2022 CLINICAL DATA:  Ulcerated swollen right foot. EXAM: RIGHT FOOT COMPLETE - 3+ VIEW COMPARISON:  None Available. FINDINGS: There is severe diffuse soft tissue  edema. Lateral view appears to demonstrate shallow ulcerations over the top of the midfoot and deeper ulceration involving 1 of the distal toes possibly the second or third toe. Posteriorly there is a broad-based soft tissue defect over the dorsal heel area. The bones are osteopenic. There is no evidence of fractures. No destructive bone lesion is evident. There is moderate midfoot arthrosis. Tarsometatarsal bridging osteophytes are noted on the lateral view superiorly and enthesopathic changes of the fifth metatarsal base. There is enthesopathic spurring of the plantar calcaneus. There are scattered  vascular calcifications in the distal foreleg. IMPRESSION: 1. Severe diffuse soft tissue edema. 2. Osteopenia and midfoot arthrosis. 3. Deep ulceration involving 1 of the distal toes, possibly the second or third toe but only well seen on the lateral view. Additional dorsal heel ulceration. 4. No destructive bone lesion is seen. 5. Vascular calcifications. Electronically Signed   By: Telford Nab M.D.   On: 05/14/2022 21:34    Review of Systems  HENT:  Negative for ear discharge, ear pain, hearing loss and tinnitus.   Eyes:  Negative for photophobia and pain.  Respiratory:  Negative for cough and shortness of breath.   Cardiovascular:  Negative for chest pain.  Gastrointestinal:  Negative for abdominal pain, nausea and vomiting.  Genitourinary:  Negative for dysuria, flank pain, frequency and urgency.  Musculoskeletal:  Positive for arthralgias (BLE, R>>L). Negative for back pain, myalgias and neck pain.  Neurological:  Negative for dizziness and headaches.  Hematological:  Does not bruise/bleed easily.  Psychiatric/Behavioral:  The patient is not nervous/anxious.    Blood pressure 120/74, pulse 96, temperature 98.1 F (36.7 C), temperature source Oral, resp. rate 18, height 5\' 11"  (1.803 m), weight (!) 159.1 kg, SpO2 91 %. Physical Exam Constitutional:      General: He is not in acute distress.     Appearance: He is well-developed. He is not diaphoretic.  HENT:     Head: Normocephalic and atraumatic.  Eyes:     General: No scleral icterus.       Right eye: No discharge.        Left eye: No discharge.     Conjunctiva/sclera: Conjunctivae normal.  Cardiovascular:     Rate and Rhythm: Normal rate and regular rhythm.  Pulmonary:     Effort: Pulmonary effort is normal. No respiratory distress.  Musculoskeletal:     Cervical back: Normal range of motion.  Feet:     Comments: Right foot: 2cm circular ulceration dorsum of foot with malodorous purulent discharge, generalized TTP, extensive lichenified skin changes, 0 DP, 0 PT, DPN/SPN/TN intact,  EHL 5/5  Left foot: Shallow irregular ulceration posteromedial distal calf, diffuse mild TTP, DPN/SPN/TN intact, EHL 5/5 Skin:    General: Skin is warm and dry.  Neurological:     Mental Status: He is alert.  Psychiatric:        Mood and Affect: Mood normal.        Behavior: Behavior normal.     Assessment/Plan: BLE cellulitis -- Agree with MRI right foot and ABI's I do not see any surgical indication as present. Continue wound care per [facility] RN.    Lisette Abu, PA-C Orthopedic Surgery [phone] 05/16/2022, 1:29 PM

## 2022-05-16 NOTE — Progress Notes (Signed)
HD#2 Subjective:   Summary: This is a 72 year old male with a past medical history of venous insufficiency, hypertension, hyperlipidemia, CAD who presents with worsening lower extremity edema and pain.  Patient admitted for sepsis, found to have Proteus and Enterobacterales bacteremia, and admitted for further evaluation and management.  Overnight Events: No overnight events  Patient is resting in bed comfortably upon my exam.  He denies any concerns this morning.  He reports some leg pain.  He states it has been hard for him to get up.  He denies any fevers or chills.  He states he is eating and drinking well.  He denies any shortness of breath or chest pain.  He denies any dysuria or hematuria.  Objective:  Vital signs in last 24 hours: Vitals:   05/15/22 1923 05/16/22 0813 05/16/22 1215 05/16/22 1246  BP: 114/65 (!) 95/52 119/72 120/74  Pulse: 93 99 (!) 106 96  Resp: 18 18 18    Temp: 98.5 F (36.9 C) 98.2 F (36.8 C) 98.1 F (36.7 C)   TempSrc: Oral Oral Oral   SpO2: 96% 95% 98% 91%  Weight:      Height:       Supplemental O2: Room Air SpO2: 91 %   Physical Exam:  Constitutional: Resting in bed, no acute distress HENT: normocephalic atraumatic Eyes: conjunctiva non-erythematous Cardiovascular: Tachycardic, no murmurs, rubs, or gallops Pulmonary/Chest: Poor inspiratory effort, normal work of breathing on room air Abdominal: Colostomy site with healthy pink tissue noted.  No necrosis noted.  Colostomy bag with minimal output.  Normoactive bowel sounds. Extremities: Chronic venous stasis skin changes appreciated on exam.  Significant wound with purulent drainage noted to dorsal aspect of right foot as as right heel.  Foul-smelling odor.  Left lower extremity with medial wound noted on hee with purulence.  Venous stasis skin changes noted to mid shin.  Filed Weights   05/14/22 1718 05/15/22 0636  Weight: (!) 164 kg (!) 159.1 kg     Intake/Output Summary (Last 24  hours) at 05/16/2022 1304 Last data filed at 05/16/2022 0900 Gross per 24 hour  Intake 2140 ml  Output 625 ml  Net 1515 ml   Net IO Since Admission: 2,405 mL [05/16/22 1304]  Pertinent Labs:    Latest Ref Rng & Units 05/16/2022    7:09 AM 05/14/2022    5:42 PM 11/23/2020    2:13 PM  CBC  WBC 4.0 - 10.5 K/uL 5.5  8.3  6.2   Hemoglobin 13.0 - 17.0 g/dL 10.8  11.7  13.0   Hematocrit 39.0 - 52.0 % 35.0  37.6  39.1   Platelets 150 - 400 K/uL 203  195  176        Latest Ref Rng & Units 05/16/2022    7:09 AM 05/15/2022    6:04 AM 05/14/2022    5:42 PM  CMP  Glucose 70 - 99 mg/dL 107  103  104   BUN 8 - 23 mg/dL 18  16  20    Creatinine 0.61 - 1.24 mg/dL 2.40  1.83  2.06   Sodium 135 - 145 mmol/L 138  139  137   Potassium 3.5 - 5.1 mmol/L 3.7  3.0  3.1   Chloride 98 - 111 mmol/L 107  109  104   CO2 22 - 32 mmol/L 21  22  22    Calcium 8.9 - 10.3 mg/dL 9.3  8.8  9.5     Imaging: No results found.  Assessment/Plan:  Principal Problem:   Localized swelling of both lower legs Active Problems:   Bacteremia due to Klebsiella pneumoniae   Cellulitis of right lower extremity   Cellulitis of left lower extremity   Patient Summary: Larry [name] is a 72 y.o. with a pertinent PMH of venous insufficiency, hypertension, hyperlipidemia, CAD, COPD who presents with concerns of right lower extremity pain and swelling.  Concern for sepsis, patient had blood cultures which resulted positive for Proteus and Enterobacterales.  Patient mated for further evaluation and management of Proteus and Enterobacterales bacteremia.  #Proteus and Enterobacterales bacteremia #Likely source from right lower extremity Patient evaluated this morning.  Patient reports having some right lower extremity pain.  He also notes drainage.  On exam, there is significant venous changes noted to bilateral lower extremities with purulent drainage from wounds on bilateral lower extremities with right worse than left.   We are unclear about the potential source, but ruled out colostomy site.  Less likely urine given no symptoms.  No sacral ulcers appreciated with skin breakdown.  Likely source could be ulcers noted to lower extremities.  Will evaluate with MRI for any significant abscess or bone infection. -Ceftriaxone day 2 -Follow-up MRI right foot with contrast -Monitor fever curve -Monitor white count -Continue to monitor cultures and follow sensitivities  #Venous insufficiency #Venous stasis dermatitis with ulceration Wound care following.  On exam there was significant purulent drainage noted to right lower extremity and left lower extremity.  Still awaiting ABIs.  Wound care cleaned off wounds today.  Recommending Aquacel.  Also recommending Unna boots. -ABIs pending -Ortho consulted, appreciate recommendations -After ABIs, can potentially consider Unna boots next -PT/OT working with patient -Continue to monitor -Apply Aquacel  #CHF #NYHA Class III #Ischemic cardiomyopathy #Hypertension Patient has a past medical history of heart failure with moderately reduced ejection fraction Most recent EF 40 to 45%.  No acute concerns for exacerbation at this time.  Patient slightly hypotensive this morning.  Will likely hold antihypertensives for now. -Hold losartan 25 mg daily, Lasix 60 mg daily, diltiazem 60 mg every 8 hours -Metoprolol 37.5 mg twice daily -Hold Jardiance 10 mg daily -As blood pressure started to respond, can start slowly reintroducing -500 cc bolus Continue to monitor volume status and blood pressure  #Deconditioning Patient over the past 2 to 3 weeks have become weaker.  Patient states he has not been walking much.  Patient worked with PT/OT yesterday, and had trouble transferring to chair.  Current recommendations is inpatient rehab. -PT/OT to continue workup patient  #CKD stage IIIb Creatinine slightly bumped up overnight, at 2.4.  This is around his baseline.  Will avoid any  nephrotoxic agents.  Likely etiology prerenal.  Will also give slight fluid bolus. -Avoid nephrotoxic agents -Monitor kidney function   #COPD No acute concerns for exacerbations. -Continue home albuterol as needed  #Colon cancer status post colostomy  Patient had concern for possible necrotic tissue around the colostomy bag yesterday.  Upon cleaning, its seem to be dried stool.  No concerns for necrotic tissue.  Less likely etiology of bacteremia. -Monitor stool output -Wound care following  #Hypokalemia, resolved Potassium at 3.7 this morning.  No acute concerns. -Plan continue monitor BMP  Diet: Heart Healthy IVF: None,None VTE: Enoxaparin Code: Full PT/OT recs: Pending  Dispo: Anticipated discharge to Rehab versus SNF versus home in 2 days pending clinical improvement.   Leigh Aurora, DO Internal Medicine Resident PGY-1 [phone] Please contact the on call pager after 5 pm and on  weekends at [phone].

## 2022-05-16 NOTE — Care Plan (Signed)
No abscess noted on MRIs of bilateral feet.  No orthopedic interventions indicated at this time.  Consider wound care as needed, with wound care follow-up as outpatient.  No orthopedic follow-up required.  Orthopedics will sign off.  Please reconsult orthopedics in the future if required.

## 2022-05-16 NOTE — Consult Note (Signed)
WOC Nurse Consult Note: Reason for Consult:bilateral lower extremity edema in the setting of CHF, CAD and COPD.    ABIs have been ordered  Patient has been caring for wounds at home.  Has not seen wound care center in two years.  Has RUQ colostomy due to history of colon cancer.   Wound type: Mixed venous and arterial, neuropathic Pressure Injury POA: Yes Measurement: Right heel:  4 cm x 4 cm x 0.2 cm nonintact lesion  pale pink, purulence noted Right dorsal foot:  2 cm x 1.5 cm x 0.2 cm ruddy red Wound bed:see above Drainage (amount, consistency, odor) moderate purulence with foul, sour odor present Periwound:Lichenification to bilateral lower legs and feet.  Thick scaly skin with obvious indentations from socks that were too tight.  Will wait on ABI to determine if safe to compress.   Dressing procedure/placement/frequency: Awaiting ABD regarding compression:  Cleanse bilateral lower legs with soap and water and moisturize with eucerin cream from feet to below knee.  Apply Aquacel (LAWSON # [id number])  to open wounds to right foot and heel.  Wrap bilateral legs with kerlix from below toes to below knee.  Secure with ace wrap (LAWSON # [id number])   Elevate legs as tolerated.   [place] Nurse ostomy consult note Stoma type/location: RUQ loop colostomy Stomal assessment/size:  oval 5 cm x 3 cm pink and moist, flush Peristomal assessment:  darkened skin around stoma (intact)- likely from removal of pouch over a long period of time.  Patient wears pouch for 7-10 days before changing. Denuded skin around stoma from 5 to 7 o'clock, protected with skin prep (does not want to use powder) Treatment options for stomal/peristomal skin:  skin prep, barrier ring and 2 piece 2 3/4" pouch Output liquid brown stool in pouch Ostomy pouching: 2pc.  Pouch with barrier ring and skin prep  barrier (LAWSON # 2)  pouch (LAWSON # [id number])  barrier ring (LAWSON # [id number]) Education provided:  photo placed in chart, no necrotic tissue  noted around stoma or of mucosa itself.  Enrolled patient in [facility] program: previously Will follow.  Once ABI completed, would like to consider Louretta Parma boot bilaterally [name] MSN, RN, FNP-BC [facility] Wound, Ostomy, Continence Nurse [facility] [phone] Pager [phone]

## 2022-05-17 DIAGNOSIS — Z87891 Personal history of nicotine dependence: Secondary | ICD-10-CM

## 2022-05-17 DIAGNOSIS — L03115 Cellulitis of right lower limb: Secondary | ICD-10-CM | POA: Diagnosis not present

## 2022-05-17 DIAGNOSIS — R2243 Localized swelling, mass and lump, lower limb, bilateral: Secondary | ICD-10-CM | POA: Diagnosis not present

## 2022-05-17 DIAGNOSIS — I872 Venous insufficiency (chronic) (peripheral): Secondary | ICD-10-CM | POA: Diagnosis not present

## 2022-05-17 DIAGNOSIS — L03116 Cellulitis of left lower limb: Secondary | ICD-10-CM | POA: Diagnosis not present

## 2022-05-17 LAB — BASIC METABOLIC PANEL
Anion gap: 12 (ref 5–15)
BUN: 19 mg/dL (ref 8–23)
CO2: 22 mmol/L (ref 22–32)
Calcium: 9.5 mg/dL (ref 8.9–10.3)
Chloride: 104 mmol/L (ref 98–111)
Creatinine, Ser: 2.45 mg/dL — ABNORMAL HIGH (ref 0.61–1.24)
GFR, Estimated: 28 mL/min — ABNORMAL LOW (ref >60)
Glucose, Bld: 100 mg/dL — ABNORMAL HIGH (ref 70–99)
Potassium: 3.8 mmol/L (ref 3.5–5.1)
Sodium: 138 mmol/L (ref 135–145)

## 2022-05-17 LAB — CBC
HCT: 35.4 % — ABNORMAL LOW (ref 39.0–52.0)
Hemoglobin: 10.8 g/dL — ABNORMAL LOW (ref 13.0–17.0)
MCH: 27.7 pg (ref 26.0–34.0)
MCHC: 30.5 g/dL (ref 30.0–36.0)
MCV: 90.8 fL (ref 80.0–100.0)
Platelets: 242 10*3/uL (ref 150–400)
RBC: 3.9 MIL/uL — ABNORMAL LOW (ref 4.22–5.81)
RDW: 14.6 % (ref 11.5–15.5)
WBC: 7.4 10*3/uL (ref 4.0–10.5)
nRBC: 0 % (ref 0.0–0.2)

## 2022-05-17 MED ORDER — METOPROLOL TARTRATE 25 MG PO TABS
37.5000 mg | ORAL_TABLET | Freq: Two times a day (BID) | ORAL | Status: DC
  Administered 2022-05-17 – 2022-05-24 (×15): 37.5 mg via ORAL
  Filled 2022-05-17 (×15): qty 1

## 2022-05-17 MED ORDER — ENOXAPARIN SODIUM 80 MG/0.8ML IJ SOSY
80.0000 mg | PREFILLED_SYRINGE | INTRAMUSCULAR | Status: DC
  Administered 2022-05-17 – 2022-05-18 (×2): 80 mg via SUBCUTANEOUS
  Filled 2022-05-17 (×3): qty 0.8

## 2022-05-17 NOTE — Care Management Important Message (Signed)
Important Message  Patient Details  Name: Larry [name] MRN: [id number] Date of Birth: 1950-08-12   Medicare Important Message Given:  Yes     Tomi Paddock Montine Circle 05/17/2022, 2:22 PM

## 2022-05-17 NOTE — Progress Notes (Signed)
OT Cancellation Note  Patient Details Name: Larry [name] MRN: [id number] DOB: March 22, 1950   Cancelled Treatment:    Reason Eval/Treat Not Completed: Patient declined, no reason specified (Upon arrival pt stated "this is all I am doing today," and proceeded to do ankle pumps. Education and maximal encouragement given but pt continued to decline participation. When asked what his personal goals for recovery are he said, "I have none.") OT to follow up at a later time/date. Pt would benefit from palliative consult, MD notified.   Elliot Cousin 05/17/2022, 12:08 PM

## 2022-05-17 NOTE — Progress Notes (Addendum)
PT Cancellation Note  Patient Details Name: Larry [name] MRN: [id number] DOB: 08-13-1950   Cancelled Treatment:    Reason Eval/Treat Not Completed: Patient declined, no reason specifiedUpon arrival with OT pt stated "this is all I am doing today," and proceeded to do ankle pumps. Education and maximal encouragement given but pt continued to decline participation. When asked by OT what his personal goals for recovery are he said, "I have none." Will check back as schedule allows to continue with PT POC.  Audry Riles. PTA Acute Rehabilitation Services Office: [place] 05/17/2022, 3:19 PM

## 2022-05-17 NOTE — Progress Notes (Signed)
Patient ID: Larry [name], male   DOB: 05-09-1950, 72 y.o.   MRN: [id number]  MRI's reviewed. No urgent surgical indication. Will probably need right 2nd toe amputation. If able to discharge before next week he should f/u with Dr. Sharol Given next week. If still admitted Monday please reach out to me, we may be able to schedule something while he's an inpatient.    Lisette Abu, PA-C Orthopedic Surgery [phone]

## 2022-05-17 NOTE — Progress Notes (Signed)
HD#3 Subjective:   Summary: This is a 72 year old male with a past medical history of venous insufficiency, hypertension, hyperlipidemia, CAD who presents with worsening lower extremity edema and pain.  Patient admitted for sepsis, found to have Proteus bacteremia.  Patient admitted for further evaluation and management.  Overnight Events: No overnight events  Patient resting in bed comfortably upon my exam.  He was sleeping, and I had to wake him up.  He states he did not get up to ambulate yesterday.  He states he is very weak, and it is hard for him to get up.  He denies any abdominal pain.  He states that he has no appetite, and feels that he sometimes gets no appetite sometimes.  He denies any shortness of breath.  He states he did not sleep well, as people kept coming in and out of his room.  Objective:  Vital signs in last 24 hours: Vitals:   05/16/22 1246 05/16/22 2016 05/17/22 0433 05/17/22 0928  BP: 120/74 124/66 107/73 (!) 140/82  Pulse: 96 100 100 (!) 109  Resp:  18 18 18   Temp:  98.2 F (36.8 C) 98.8 F (37.1 C) 98.4 F (36.9 C)  TempSrc:  Oral Oral   SpO2: 91% 97% 100% 100%  Weight:      Height:       Supplemental O2: Room Air SpO2: 100 %   Physical Exam:  Constitutional: Resting in bed, no acute distress HENT: normocephalic atraumatic Eyes: conjunctiva non-erythematous Cardiovascular: Tachycardic, no murmurs, rubs, or gallops Pulmonary/Chest: Poor inspiratory effort throughout, normal work of breathing on room air, no crackles appreciated Abdominal: No abdominal tenderness.  Normal active bowel sounds.  Colostomy site with some stool output noted. Extremities: Bilateral lower extremities wrapped in dressing.  2+ pitting edema appreciated.  Lower extremities tender to palpation.  Purulent drainage noted through bandages noted on right aspect.  Left second toe lateral region with ulceration present, with no probe to bone appreciated.   Filed Weights    05/14/22 1718 05/15/22 0636  Weight: (!) 164 kg (!) 159.1 kg     Intake/Output Summary (Last 24 hours) at 05/17/2022 1407 Last data filed at 05/17/2022 1300 Gross per 24 hour  Intake 240 ml  Output 1150 ml  Net -910 ml   Net IO Since Admission: 2,045 mL [05/17/22 1407]  Pertinent Labs:    Latest Ref Rng & Units 05/17/2022    4:24 AM 05/16/2022    7:09 AM 05/14/2022    5:42 PM  CBC  WBC 4.0 - 10.5 K/uL 7.4  5.5  8.3   Hemoglobin 13.0 - 17.0 g/dL 10.8  10.8  11.7   Hematocrit 39.0 - 52.0 % 35.4  35.0  37.6   Platelets 150 - 400 K/uL 242  203  195        Latest Ref Rng & Units 05/17/2022    4:24 AM 05/16/2022    7:09 AM 05/15/2022    6:04 AM  CMP  Glucose 70 - 99 mg/dL 100  107  103   BUN 8 - 23 mg/dL 19  18  16    Creatinine 0.61 - 1.24 mg/dL 2.45  2.40  1.83   Sodium 135 - 145 mmol/L 138  138  139   Potassium 3.5 - 5.1 mmol/L 3.8  3.7  3.0   Chloride 98 - 111 mmol/L 104  107  109   CO2 22 - 32 mmol/L 22  21  22    Calcium 8.9 -  10.3 mg/dL 9.5  9.3  8.8     Imaging: MR FOOT LEFT W WO CONTRAST  Result Date: 05/16/2022 CLINICAL DATA:  Soft tissue infection suspected, foot, xray done EXAM: MRI OF THE LEFT FOREFOOT WITHOUT AND WITH CONTRAST TECHNIQUE: Multiplanar, multisequence MR imaging of the left foot was performed both before and after administration of intravenous contrast. CONTRAST:  30mL GADAVIST GADOBUTROL 1 MMOL/ML IV SOLN COMPARISON:  Radiograph 05/14/2022. FINDINGS: Bones/Joint/Cartilage Motion degraded exam with large field of view which limits evaluation. There is focal marrow edema within the second toe distal phalanx with associated low T1 signal and enhancement (coronal stir and T1 image 9). There is no other significant marrow signal alteration in the foot. Ligaments Intact Lisfranc ligament. Intact lateral ankle ligaments. Medial ankle ligaments are poorly evaluated. Muscles and Tendons Diffuse muscle atrophy compatible with chronic denervation change. Soft tissues  Extensive skin thickening and subcutaneous soft tissue swelling. There is no evidence of an organized, rim enhancing fluid collection. IMPRESSION: Extensive skin thickening and subcutaneous soft tissue swelling, as can be seen in cellulitis or lymphedema. No evidence of an organized soft tissue abscess. Marrow signal abnormality within the second toe distal phalanx concerning for osteomyelitis, correlate with presence of an adjacent soft tissue ulcer. Electronically Signed   By: Maurine Simmering M.D.   On: 05/16/2022 16:50   MR FOOT RIGHT W WO CONTRAST  Result Date: 05/16/2022 CLINICAL DATA:  Soft tissue infection suspected, foot, xray done EXAM: MRI OF THE RIGHT FOREFOOT WITHOUT AND WITH CONTRAST TECHNIQUE: Multiplanar, multisequence MR imaging of the right foot was performed before and after the administration of intravenous contrast. CONTRAST:  100mL GADAVIST GADOBUTROL 1 MMOL/ML IV SOLN COMPARISON:  Right foot radiograph 05/14/2022 FINDINGS: Bones/Joint/Cartilage Large field-of-view results in distortion of multiple toes. There is periarticular marrow edema at the first TMT joint related to osteoarthritis. There is no other significant marrow signal alteration. Ligaments Intact Lisfranc ligament. Intact lateral ankle ligaments. Intact medial ankle ligaments. Muscles and Tendons Diffuse muscle atrophy consistent with chronic denervation change. Soft tissues Extensive skin thickening and subcutaneous soft tissue swelling of the ankle and foot. Dorsal midfoot ulcer with surrounding soft tissue swelling. No evidence of an organized fluid collection. IMPRESSION: Extensive skin thickening and subcutaneous soft tissue swelling as can be seen in cellulitis or lymphedema. Dorsal midfoot ulcer noted. No evidence of an organized soft tissue abscess. Multiple toes are distorted by large scan field of view and are incompletely evaluated. Electronically Signed   By: Maurine Simmering M.D.   On: 05/16/2022 16:42    Assessment/Plan:    Principal Problem:   Localized swelling of both lower legs Active Problems:   Bacteremia due to Klebsiella pneumoniae   Cellulitis of right lower extremity   Cellulitis of left lower extremity   Patient Summary: Larry [name] is a 72 y.o. with a pertinent PMH of venous insufficiency, hypertension, hyperlipidemia, CAD, COPD who presents with concerns of right lower extremity pain and swelling.  Concern for sepsis, patient had blood cultures which resulted positive for Proteus. Patient mated for further evaluation and management of Proteus bacteremia.  #Proteus bacteremia #Likely source from right lower extremity Patient evaluated this morning.  He states pain is improved.  Reports 7 some right lower extremity pain.  On exam, patient does have dressing wrapped on bilateral lower extremities.  There is drainage appreciated through dressings on right lower extremity on heel.  Cultures with sensitivities showing Proteus sensitive to all Abx.  MRI showing possible osteo of  left second length.  On exam, patient did have ulceration, but no probe to bone.  Ortho suggesting possible left second phalanx amputation likely in the outpatient setting.  No acute changes.  Patient is improving given no white count, he remains afebrile. -Ceftriaxone day 3 -Likely will need oral antibiotic on discharge to complete 7-day course -Monitor fever curve -Monitor white count  #Venous insufficiency #Venous stasis dermatitis with ulceration MRI showing possible osteo of left second toe, but on clinical exam less likely.  Wound care following.  Ortho is stating that that he might need amputation of left second toe.  ABIs were unable to be done.  Continue with wound care.  Will hold off on Unna boots.  -Ortho consulted, appreciate recommendations -Continue to monitor -Apply Aquacel -Continue with wound care -Kerlix dressings  #CHF #NYHA Class III #Ischemic cardiomyopathy #Hypertension No acute concern  for exacerbation at this time.  Blood pressure improved today.  Patient is tachycardic, likely secondary to discontinuation of metoprolol.  I do not think this is sepsis related.  I believe blood pressure can tolerate restarting metoprolol.  Will continue to monitor.  -Hold losartan 25 mg daily, Lasix 60 mg daily, diltiazem 60 mg every 8 hours -Resume home metoprolol 37.5 mg twice daily -Hold Jardiance 10 mg daily -As blood pressure started to respond, can start slowly reintroducing other GDMT -Continue to monitor volume status and blood pressure  #Deconditioning Patient has become weaker.  He states she does not want to get up and move today.  PT/OT encouraged him to get up and move, he states he does not want to.  Patient will likely require a good amount of PT to get back to normal mobilization. -PT OT to continue to work with patient  -Encourage patient to get out of bed  #CKD stage IIIb Creatinine bumped to 2.45.  Baseline around the same.  Avoid any nephrotoxic agents. -Continue to monitor kidney function  #COPD No acute concerns for exacerbations -Continue home albuterol as needed  Diet: Heart Healthy IVF: None,None VTE: Enoxaparin Code: Full PT/OT recs: Pending  Dispo: Anticipated discharge to Rehab versus SNF versus home in 2 days pending clinical improvement and SNF placement   Leigh Aurora, DO Internal Medicine Resident PGY-1 [phone] Please contact the on call pager after 5 pm and on weekends at [phone].

## 2022-05-18 DIAGNOSIS — R2243 Localized swelling, mass and lump, lower limb, bilateral: Secondary | ICD-10-CM | POA: Diagnosis not present

## 2022-05-18 DIAGNOSIS — L03116 Cellulitis of left lower limb: Secondary | ICD-10-CM | POA: Diagnosis not present

## 2022-05-18 DIAGNOSIS — I872 Venous insufficiency (chronic) (peripheral): Secondary | ICD-10-CM | POA: Diagnosis not present

## 2022-05-18 DIAGNOSIS — L03115 Cellulitis of right lower limb: Secondary | ICD-10-CM | POA: Diagnosis not present

## 2022-05-18 LAB — BASIC METABOLIC PANEL
Anion gap: 12 (ref 5–15)
BUN: 22 mg/dL (ref 8–23)
CO2: 19 mmol/L — ABNORMAL LOW (ref 22–32)
Calcium: 9.4 mg/dL (ref 8.9–10.3)
Chloride: 106 mmol/L (ref 98–111)
Creatinine, Ser: 2.76 mg/dL — ABNORMAL HIGH (ref 0.61–1.24)
GFR, Estimated: 24 mL/min — ABNORMAL LOW (ref >60)
Glucose, Bld: 101 mg/dL — ABNORMAL HIGH (ref 70–99)
Potassium: 3.9 mmol/L (ref 3.5–5.1)
Sodium: 137 mmol/L (ref 135–145)

## 2022-05-18 LAB — CBC
HCT: 36.6 % — ABNORMAL LOW (ref 39.0–52.0)
Hemoglobin: 10.9 g/dL — ABNORMAL LOW (ref 13.0–17.0)
MCH: 27.7 pg (ref 26.0–34.0)
MCHC: 29.8 g/dL — ABNORMAL LOW (ref 30.0–36.0)
MCV: 92.9 fL (ref 80.0–100.0)
Platelets: 218 10*3/uL (ref 150–400)
RBC: 3.94 MIL/uL — ABNORMAL LOW (ref 4.22–5.81)
RDW: 14.5 % (ref 11.5–15.5)
WBC: 6.2 10*3/uL (ref 4.0–10.5)
nRBC: 0 % (ref 0.0–0.2)

## 2022-05-18 LAB — CULTURE, BLOOD (ROUTINE X 2)

## 2022-05-18 LAB — GLUCOSE, CAPILLARY
Glucose-Capillary: 97 mg/dL (ref 70–99)
Glucose-Capillary: 98 mg/dL (ref 70–99)

## 2022-05-18 MED ORDER — OXYCODONE HCL 5 MG PO TABS
10.0000 mg | ORAL_TABLET | Freq: Four times a day (QID) | ORAL | Status: DC | PRN
  Administered 2022-05-21 (×2): 10 mg via ORAL
  Filled 2022-05-18 (×2): qty 2

## 2022-05-18 MED ORDER — ACETAMINOPHEN 500 MG PO TABS
1000.0000 mg | ORAL_TABLET | Freq: Three times a day (TID) | ORAL | Status: DC
  Administered 2022-05-18 – 2022-05-24 (×19): 1000 mg via ORAL
  Filled 2022-05-18 (×20): qty 2

## 2022-05-18 MED ORDER — AMOXICILLIN-POT CLAVULANATE 875-125 MG PO TABS
1.0000 | ORAL_TABLET | Freq: Two times a day (BID) | ORAL | Status: DC
  Administered 2022-05-18 – 2022-05-19 (×3): 1 via ORAL
  Filled 2022-05-18 (×3): qty 1

## 2022-05-18 MED ORDER — GABAPENTIN 100 MG PO CAPS
200.0000 mg | ORAL_CAPSULE | Freq: Two times a day (BID) | ORAL | Status: DC
  Administered 2022-05-18 – 2022-05-24 (×13): 200 mg via ORAL
  Filled 2022-05-18 (×13): qty 2

## 2022-05-18 MED ORDER — FUROSEMIDE 40 MG PO TABS
60.0000 mg | ORAL_TABLET | Freq: Every day | ORAL | Status: DC
  Administered 2022-05-18 – 2022-05-19 (×2): 60 mg via ORAL
  Filled 2022-05-18 (×2): qty 1

## 2022-05-18 NOTE — Plan of Care (Signed)
Pt alert and oriented x 4. Up with max assist due to legs. Has not been up this shift. Pt refused mobility. Dsg to bilateral legs intact. Dsg change due at 1700 today. Lovenox for DVT prevention. Vitals stable. No prns given.  Problem: Health Behavior/Discharge Planning: Goal: Ability to manage health-related needs will improve Outcome: Progressing   Problem: Clinical Measurements: Goal: Ability to maintain clinical measurements within normal limits will improve Outcome: Progressing Goal: Will remain free from infection Outcome: Progressing Goal: Diagnostic test results will improve Outcome: Progressing Goal: Respiratory complications will improve Outcome: Progressing Goal: Cardiovascular complication will be avoided Outcome: Progressing   Problem: Activity: Goal: Risk for activity intolerance will decrease Outcome: Progressing   Problem: Nutrition: Goal: Adequate nutrition will be maintained Outcome: Progressing   Problem: Coping: Goal: Level of anxiety will decrease Outcome: Progressing   Problem: Elimination: Goal: Will not experience complications related to bowel motility Outcome: Progressing Goal: Will not experience complications related to urinary retention Outcome: Progressing   Problem: Pain Managment: Goal: General experience of comfort will improve Outcome: Progressing   Problem: Safety: Goal: Ability to remain free from injury will improve Outcome: Progressing   Problem: Skin Integrity: Goal: Risk for impaired skin integrity will decrease Outcome: Progressing

## 2022-05-18 NOTE — Progress Notes (Signed)
Physical Therapy Treatment Patient Details Name: Larry [name] MRN: [id number] DOB: [birth year] Today's Date: 05/18/2022   History of Present Illness Patient is a 72 yo male presenting to the ED with BLE swelling, increased weakness and pain with an inability to walk on 05/14/22. Admitted same day with cellulitis; R dorsal foot wound, bil ankle indentations at sock cuffs;  PMH includes: HTN, HLD, CAD, COPD, obesity, NSVT, CKD, CHF, colon cancer s/p resection and colostomy placement, and sleep apnea    PT Comments    Pt greeted supine in bed and agreeable to bed level exercises with good progress with all AROM. Pt able to pull up into partial long sitting x5 with up to mod A and pull across body with use of bedrail x5 ea side to begin to assist with all bed mobility. Pt with improved tolerance for LE therex with pt able to actively perform all movements with cues for technique and encouragement. Plan to progress to sitting up EOB next session with +2 assist with pt agreeable.  Noted improvement in pt participation and general affect this date. Pt continues to benefit from skilled PT services to progress toward functional mobility goals.    Recommendations for follow up therapy are one component of a multi-disciplinary discharge planning process, led by the attending physician.  Recommendations may be updated based on patient status, additional functional criteria and insurance authorization.      Assistance Recommended at Discharge Intermittent Supervision/Assistance  Patient can return home with the following Two people to help with walking and/or transfers;Two people to help with bathing/dressing/bathroom;Assist for transportation;Help with stairs or ramp for entrance   Equipment Recommendations  Rolling walker (2 wheels);BSC/3in1;Wheelchair (measurements PT);Wheelchair cushion (measurements PT) ([facility] RW and [facility]; consider 22-24 inch wide WC/cushion -- can consider hospital bed; will  continue to discern based on progress with therapies)    Recommendations for Other Services       Precautions / Restrictions Precautions Precautions: Fall Restrictions Weight Bearing Restrictions: No Other Position/Activity Restrictions: Very painful with LE movement, touching down to floor; did not tolerate weight bearing on 3/24     Mobility  Bed Mobility Overal bed mobility: Needs Assistance Bed Mobility: Supine to Sit, Rolling Rolling: Min assist   Supine to sit: Mod assist     General bed mobility comments: able to pull up into partial long sitting x5 with up to mod A, able to pull with UE on rails x5 each side R/L to partial roll for pt to begin to asssit with all bed mobility    Transfers                   General transfer comment: declining    Ambulation/Gait                   Stairs             Wheelchair Mobility    Modified Rankin (Stroke Patients Only)       Balance                                            Cognition Arousal/Alertness: Awake/alert Behavior During Therapy: WFL for tasks assessed/performed Overall Cognitive Status: Within Functional Limits for tasks assessed  Exercises General Exercises - Lower Extremity Heel Slides: AROM, AAROM, Right, Left, 10 reps, Supine Hip ABduction/ADduction: AROM, Right, Left, 10 reps, Supine Straight Leg Raises: AROM, Right, Left, 5 reps, Supine Other Exercises Other Exercises: long axis hip internal/external roation x10 ea side Other Exercises: pulling up into partal long sitting x5 Other Exercises: pulling across body x5 ea side into partial roll    General Comments General comments (skin integrity, edema, etc.): VSS on RA, pt continues to be limited by pain however noted improvedment in all AROM and pt participation, agreeable to EOB next session      Pertinent Vitals/Pain Pain Assessment Pain  Assessment: Faces Faces Pain Scale: Hurts little more Pain Location: Bilateral LE's with movement Pain Descriptors / Indicators: Discomfort, Grimacing, Guarding Pain Intervention(s): Monitored during session, Limited activity within patient's tolerance    Home Living                          Prior Function            PT Goals (current goals can now be found in the care plan section) Acute Rehab PT Goals Patient Stated Goal: agrees that he must be able to do more to be able to get home PT Goal Formulation: With patient Time For Goal Achievement: 05/23/22    Frequency    Min 3X/week      PT Plan      Co-evaluation PT/OT/SLP Co-Evaluation/Treatment: Yes            AM-PAC PT "6 Clicks" Mobility   Outcome Measure  Help needed turning from your back to your side while in a flat bed without using bedrails?: A Lot Help needed moving from lying on your back to sitting on the side of a flat bed without using bedrails?: Total Help needed moving to and from a bed to a chair (including a wheelchair)?: Total Help needed standing up from a chair using your arms (e.g., wheelchair or bedside chair)?: Total Help needed to walk in hospital room?: Total Help needed climbing 3-5 steps with a railing? : Total 6 Click Score: 7    End of Session   Activity Tolerance: Patient limited by pain (also perhaps anticipation of pain, but pt attempted all the tasks asked of him) Patient left: in bed;with call bell/phone within reach;with bed alarm set Nurse Communication: Mobility status PT Visit Diagnosis: Unsteadiness on feet (R26.81);Other abnormalities of gait and mobility (R26.89);Muscle weakness (generalized) (M62.81);History of falling (Z91.81);Pain;Other (comment) (decr skin integrity) Pain - Right/Left: Right Pain - part of body: Leg;Ankle and joints of foot (also soreness L LE)     Time: [id number] PT Time Calculation (min) (ACUTE ONLY): 15 min  Charges:  $Therapeutic  Exercise: 8-22 mins                     Haely Leyland R. PTA Acute Rehabilitation Services Office: [place] 05/18/2022, 4:46 PM

## 2022-05-18 NOTE — Progress Notes (Signed)
Occupational Therapy Treatment Patient Details Name: Larry [name] MRN: [id number] DOB: 1950/09/28 Today's Date: 05/18/2022   History of present illness Patient is a 72 yo male presenting to the ED with BLE swelling, increased weakness and pain with an inability to walk on 05/14/22. Admitted same day with cellulitis; R dorsal foot wound, bil ankle indentations at sock cuffs;  PMH includes: HTN, HLD, CAD, COPD, obesity, NSVT, CKD, CHF, colon cancer s/p resection and colostomy placement, and sleep apnea   OT comments  Patient presenting with pain in BLEs with all movement and need for significant assist in order to complete grooming and bed level ADL's as pt was unable to sit at EOB x4 attempts and Max +2 assist. Pain is limiting factor overall and impacting pt ability to perform ADL's and functional mobility. Pt was educated in role of OT and plan of care/goals. Pt stated agreement with goals and stated "I want to walk and sit up and do things, I can't". Discharge recommendations down graded to SNF rehab.   Recommendations for follow up therapy are one component of a multi-disciplinary discharge planning process, led by the attending physician.  Recommendations may be updated based on patient status, additional functional criteria and insurance authorization.    Assistance Recommended at Discharge Frequent or constant Supervision/Assistance  Patient can return home with the following  Two people to help with walking and/or transfers;A lot of help with bathing/dressing/bathroom;Assist for transportation;Help with stairs or ramp for entrance   Equipment Recommendations  Other (comment) (Defer to next venue)    Recommendations for Other Services      Precautions / Restrictions Precautions Precautions: Fall Restrictions Weight Bearing Restrictions: No       Mobility Bed Mobility Overal bed mobility: Needs Assistance Bed Mobility: Supine to Sit     Supine to sit: +2 for physical  assistance, Total assist Sit to supine: +2 for physical assistance, Total assist   General bed mobility comments: Pt remains hesitant but agreeable to move and attempted to sit up at EOB x4 without success despite HOB being elevated, use of rails etc;  Agrees to try with encouragement and discussing rationale for attempting mobiltiy/ADLs; Pt made heavy use of berails and UE/LE support - pain is limiting factor overall per pt report.    Transfers  Pt unable - attempted sitting up at EOB x4, pain is limiting factor   Balance     ADL either performed or assessed with clinical judgement   ADL Overall ADL's : Needs assistance/impaired     Grooming: Wash/dry hands;Wash/dry face;Oral care;Set up;Bed level Grooming Details (indicate cue type and reason): Attempted sitting up at EOB but stating he was unable, therefore grooming was completed at bed level Upper Body Bathing: Minimal assistance;Set up;Bed level   Lower Body Bathing: Maximal assistance;Total assistance;Bed level   Toilet Transfer Details (indicate cue type and reason): did not attempt   General ADL Comments: Patient presenting with pain in BLEs with all movement and need for significant assist in order to complete grooming and bed level ADL's as pt was unable to sit at EOB x4 attempts and Max +2 assist. Pain is limiting factor and impacting pt ability to perform ADL's and functional mobility. Pt was educated in role of OT and plan of care/goals. Pt stated agreement with goals ans stated "I want to walk and sit up and do things, I can't".    Extremity/Trunk Assessment Upper Extremity Assessment Upper Extremity Assessment: Generalized weakness   Lower Extremity Assessment Lower  Extremity Assessment: Defer to PT evaluation   Cervical / Trunk Assessment Cervical / Trunk Assessment: Other exceptions (Increased body habitus)    Vision Ability to See in Adequate Light: 0 Adequate Patient Visual Report: No change from  baseline Vision Assessment?: No apparent visual deficits          Cognition Arousal/Alertness: Awake/alert Behavior During Therapy: WFL for tasks assessed/performed Overall Cognitive Status: Within Functional Limits for tasks assessed                     Pertinent Vitals/ Pain       Pain Assessment Pain Assessment: Faces Faces Pain Scale: Hurts whole lot Pain Location: Bilateral LE's with movement Pain Descriptors / Indicators: Discomfort, Grimacing, Guarding Pain Intervention(s): Monitored during session, Limited activity within patient's tolerance, Repositioned  Home Living Family/patient expects to be discharged to:: Private residence Living Arrangements: Spouse/significant other Available Help at Discharge: Available 24 hours/day Type of Home: House Home Access: Stairs to enter [employer] of Steps: 1 Entrance Stairs-Rails: None Home Layout: Full bath on main level;Bed/bath upstairs;Two level Alternate Level Stairs-Number of Steps: does not go upstairs   Bathroom Shower/Tub: Occupational psychologist: Handicapped height     Home Equipment: None          Prior Functioning/Environment   Refer to initial OT assessment for PLOF. Pt reports Mod I overall, was taking bird bathes at baseline. Stays on first floor of house.   Frequency  Min 2X/week        Progress Toward Goals  OT Goals(current goals can now be found in the care plan section)  Progress towards OT goals: Progressing toward goals;Not progressing toward goals - comment (Pain with movement is limiting factor)  Acute Rehab OT Goals OT Goal Formulation: With patient Time For Goal Achievement: 05/29/22 Potential to Achieve Goals: [place]  Plan Discharge plan needs to be updated       AM-PAC OT "6 Clicks" Daily Activity     Outcome Measure   Help from another person eating meals?: None Help from another person taking care of personal grooming?: A Little Help from another  person toileting, which includes using toliet, bedpan, or urinal?: A Lot Help from another person bathing (including washing, rinsing, drying)?: A Lot Help from another person to put on and taking off regular upper body clothing?: A Lot Help from another person to put on and taking off regular lower body clothing?: Total 6 Click Score: 14    End of Session    OT Visit Diagnosis: Unsteadiness on feet (R26.81);Other abnormalities of gait and mobility (R26.89);Muscle weakness (generalized) (M62.81);Pain Pain - Right/Left:  (Bilateral LE's, back) Pain - part of body:  (Bilateral LE's and back with all movement)   Activity Tolerance Patient limited by fatigue;Patient limited by pain   Patient Left in bed;with call bell/phone within reach;with bed alarm set   Nurse Communication Need for lift equipment;Other (comment) (attempted supine to sit at EOB x4, pt unable to tolerate due to LE pain bilaterally)        Time: [id number] OT Time Calculation (min): 19 min  Charges: OT General Charges $OT Visit: 1 Visit OT Treatments $Self Care/Home Management : 8-22 mins   Aadil Sur Beth Dixon, OTR/L 05/18/2022, 11:40 AM

## 2022-05-18 NOTE — Progress Notes (Signed)
HD#4 Subjective:   Summary: This is a 72 year old male with a past medical history of venous insufficiency, hypertension, hyperlipidemia, CAD who presents with worsening lower extremity edema and pain.  Patient admitted for sepsis, found to have Proteus bacteremia.  Patient admitted for further evaluation and management.  Overnight Events: No overnight events  Patient states he is doing okay.  He denies any fever or chills.  He does endorse some suprapubic pain today.  He states it happened after he drank some water 2 to 3 hours ago.  He states prior to arrival, 3 weeks before admission he was able to walk around the house with no assistance.  He has since not been able to walk.  He states his goals include being able to walk to the car.  He states his reluctance he to walk is due to pins and needle pain to the bottom of his feet.  He states he does not want to walk today.  He does not want to talk to friends or family, and does not feel like talking to a chaplain.  I did try to explain to him importance of moving, and patient is understanding about this.  He states he will try get up and move today.  Objective:  Vital signs in last 24 hours: Vitals:   05/17/22 0928 05/17/22 1539 05/17/22 2031 05/18/22 0528  BP: (!) 140/82 121/74 128/81 139/73  Pulse: (!) 109 (!) 105 (!) 101 100  Resp: 18 17 18 16   Temp: 98.4 F (36.9 C) 98.8 F (37.1 C) 98.3 F (36.8 C) 99.1 F (37.3 C)  TempSrc:  Oral Oral Oral  SpO2: 100% 93% 100% 97%  Weight:      Height:       Supplemental O2: Room Air SpO2: 97 %   Physical Exam:  Constitutional: Resting in bed, no acute distress HENT: normocephalic atraumatic Eyes: conjunctiva non-erythematous Cardiovascular: Tachycardic, no murmurs, rubs, or gallops Pulmonary/Chest: Poor inspiratory effort.  No crackles, rales, rhonchi appreciated.  Normal work of breathing on room air.  Abdominal: Colostomy with good output.  No abdominal tenderness.  Normal active  bowel sounds.  Colostomy site with some stool output noted.  No rashes appreciated to normal suprapubic region. Extremities: Bilateral lower extremities wrapped in Kerlix dressings.  Purulent drainage noted through Kerlix on right lower extremity.  2+ pitting edema appreciated bilaterally.  Filed Weights   05/14/22 1718 05/15/22 0636  Weight: (!) 164 kg (!) 159.1 kg     Intake/Output Summary (Last 24 hours) at 05/18/2022 0640 Last data filed at 05/18/2022 0500 Gross per 24 hour  Intake 240 ml  Output 1250 ml  Net -1010 ml    Net IO Since Admission: 1,245 mL [05/18/22 0640]  Pertinent Labs:    Latest Ref Rng & Units 05/18/2022    2:04 AM 05/17/2022    4:24 AM 05/16/2022    7:09 AM  CBC  WBC 4.0 - 10.5 K/uL 6.2  7.4  5.5   Hemoglobin 13.0 - 17.0 g/dL 10.9  10.8  10.8   Hematocrit 39.0 - 52.0 % 36.6  35.4  35.0   Platelets 150 - 400 K/uL 218  242  203        Latest Ref Rng & Units 05/18/2022    2:04 AM 05/17/2022    4:24 AM 05/16/2022    7:09 AM  CMP  Glucose 70 - 99 mg/dL 101  100  107   BUN 8 - 23 mg/dL 22  19  18   Creatinine 0.61 - 1.24 mg/dL 2.76  2.45  2.40   Sodium 135 - 145 mmol/L 137  138  138   Potassium 3.5 - 5.1 mmol/L 3.9  3.8  3.7   Chloride 98 - 111 mmol/L 106  104  107   CO2 22 - 32 mmol/L 19  22  21    Calcium 8.9 - 10.3 mg/dL 9.4  9.5  9.3     Imaging: No results found.  Assessment/Plan:   Principal Problem:   Localized swelling of both lower legs Active Problems:   Bacteremia due to Klebsiella pneumoniae   Cellulitis of right lower extremity   Cellulitis of left lower extremity   Patient Summary: Larry [name] is a 72 y.o. with a pertinent PMH of venous insufficiency, hypertension, hyperlipidemia, CAD, COPD who presents with concerns of right lower extremity pain and swelling.  Concern for sepsis, patient had blood cultures which resulted positive for Proteus. Patient mated for further evaluation and management of Proteus  bacteremia.  #Proteus bacteremia #Bilateral lower extremity cellulitis Patient valuated this morning.  He states that his pain is hindering him from walking.  On exam, patient still has Kerlix dressings on bilateral lower extremities.  Patient remains afebrile.  White count remaining normal.  Will transition to oral antibiotics today.  Will do 3 more days of oral antibiotics.  Will likely have patient follow-up with podiatry in the outpatient setting. -Start Augmentin day 1  -Monitor fever curve -Monitor white count  #Venous insufficiency #Venous stasis dermatitis with ulceration Patient continues to follow with wound care.  Kerlix dressings have some drainage appreciated.  Continue to apply Aquacel.  Podiatry outpatient.  No Unna boots.  Unable to do ABIs.  Will titrate up pain regimen. -Continue to monitor -Apply Aquacel -Continue with wound care -Kerlix dressings -Multimodal pain control with Tylenol 1000 mg every 8 hours, gabapentin 200 mg twice daily, and oxycodone 10 mg every 6 hours as needed  #CHF #NYHA Class III #Ischemic cardiomyopathy #Hypertension No acute concern for exacerbation at this time.  Blood pressure remains normal tensive today.  Patient is tachycardic, but slightly improved.  Will restart Lasix today.   -Hold losartan 25 mg daily, diltiazem 60 mg every 8 hours -Continue home metoprolol 37.5 mg twice daily -Resume Lasix 60 mg daily -Hold Jardiance 10 mg daily -As blood pressure started to respond, can start slowly reintroducing other GDMT -Continue to monitor volume status and blood pressure  #Deconditioning Patient become more weak over the past 3 weeks. He states he used to walk and not able to walk or even stand. Some of this is due to pain and some of this is deconditioning. Will plan to increase paitent's pain regimen and encourage patient to work with PT/OT.  Will determine management. -PT OT to continue to work with patient  -Multimodal pain control with  Tylenol 1000 mg every 8 hours, gabapentin 200 mg twice daily, and oxycodone 10 mg every 6 hours as needed -Encourage patient to get out of bed  #AKI on CKD stage IIIb Creatinine bumped up to 2.76 today.  Urine output still good with 800 mL.  Current etiology likely either dehydration versus different from the decreased perfusion secondary to Lasix being stopped.  Will resume Lasix today.  Monitor kidney function.  If creatinine continues to increase, will likely give gentle fluids. -Restart Lasix 60 mg daily today -Monitor kidney function  #COPD No acute concerns for exacerbations -Continue home albuterol as needed  Diet: Heart  Healthy IVF: None,None VTE: Enoxaparin Code: Full PT/OT recs: Pending, encouraged patient to work with PT today.  Dispo: Anticipated discharge to Rehab versus SNF versus home in 2 days pending clinical improvement and SNF placement   Leigh Aurora, DO Internal Medicine Resident PGY-1 [phone] Please contact the on call pager after 5 pm and on weekends at [phone].

## 2022-05-19 DIAGNOSIS — L03116 Cellulitis of left lower limb: Secondary | ICD-10-CM | POA: Diagnosis not present

## 2022-05-19 DIAGNOSIS — L03115 Cellulitis of right lower limb: Secondary | ICD-10-CM | POA: Diagnosis not present

## 2022-05-19 DIAGNOSIS — B964 Proteus (mirabilis) (morganii) as the cause of diseases classified elsewhere: Secondary | ICD-10-CM | POA: Diagnosis not present

## 2022-05-19 DIAGNOSIS — N179 Acute kidney failure, unspecified: Secondary | ICD-10-CM

## 2022-05-19 DIAGNOSIS — R7881 Bacteremia: Secondary | ICD-10-CM | POA: Diagnosis not present

## 2022-05-19 LAB — BASIC METABOLIC PANEL
Anion gap: 12 (ref 5–15)
BUN: 32 mg/dL — ABNORMAL HIGH (ref 8–23)
CO2: 19 mmol/L — ABNORMAL LOW (ref 22–32)
Calcium: 9.1 mg/dL (ref 8.9–10.3)
Chloride: 105 mmol/L (ref 98–111)
Creatinine, Ser: 3.51 mg/dL — ABNORMAL HIGH (ref 0.61–1.24)
GFR, Estimated: 18 mL/min — ABNORMAL LOW (ref >60)
Glucose, Bld: 99 mg/dL (ref 70–99)
Potassium: 3.8 mmol/L (ref 3.5–5.1)
Sodium: 136 mmol/L (ref 135–145)

## 2022-05-19 LAB — CBC
HCT: 36.2 % — ABNORMAL LOW (ref 39.0–52.0)
Hemoglobin: 10.9 g/dL — ABNORMAL LOW (ref 13.0–17.0)
MCH: 27.3 pg (ref 26.0–34.0)
MCHC: 30.1 g/dL (ref 30.0–36.0)
MCV: 90.5 fL (ref 80.0–100.0)
Platelets: 238 10*3/uL (ref 150–400)
RBC: 4 MIL/uL — ABNORMAL LOW (ref 4.22–5.81)
RDW: 14.6 % (ref 11.5–15.5)
WBC: 5.1 10*3/uL (ref 4.0–10.5)
nRBC: 0 % (ref 0.0–0.2)

## 2022-05-19 MED ORDER — ENOXAPARIN SODIUM 30 MG/0.3ML IJ SOSY
30.0000 mg | PREFILLED_SYRINGE | INTRAMUSCULAR | Status: DC
  Administered 2022-05-19 – 2022-05-23 (×5): 30 mg via SUBCUTANEOUS
  Filled 2022-05-19 (×5): qty 0.3

## 2022-05-19 MED ORDER — AMOXICILLIN-POT CLAVULANATE 250-125 MG PO TABS
1.0000 | ORAL_TABLET | Freq: Three times a day (TID) | ORAL | Status: DC
  Administered 2022-05-19 – 2022-05-20 (×3): 1 via ORAL
  Filled 2022-05-19 (×4): qty 1

## 2022-05-19 MED ORDER — LACTATED RINGERS IV BOLUS
500.0000 mL | Freq: Once | INTRAVENOUS | Status: AC
  Administered 2022-05-19: 500 mL via INTRAVENOUS

## 2022-05-19 NOTE — Progress Notes (Signed)
HD#5 Subjective:   Summary: This is a 72 year old male with a past medical history of venous insufficiency, hypertension, hyperlipidemia, CAD who presents with worsening lower extremity edema and pain.  Patient admitted for sepsis, found to have Proteus bacteremia.  Patient admitted for further evaluation and management.  Overnight Events: No overnight events  Patient evaluated bedside this morning.  He states he worked with PT yesterday, but felt sore during his movement.  He states he did not make it to the side of the bed, and only did movements while laying down.  He states his pain is really in both of his legs.  On his right leg it is on the dorsal region and moves up to his mid shin.  He states his left leg pain is on his left medial shin.  He states these are sharp pains.  He denies any pins-and-needles or burning sensation.  He states it only hurts when he walks on his feet.  He also reports he has not been eating or drinking well.  He only had 1 shasta drink to drink and 1 cup of water yesterday.  He denies a history of diabetes.  He does not recall seeing a podiatrist, but after talks with his wife, patient did [facility].  Per patient, he has been slowly declining, but has been able to walk, but over the past 3 weeks leading to his admission, he had not been walking due to this pain.  Objective:  Vital signs in last 24 hours: Vitals:   05/18/22 2105 05/19/22 0105 05/19/22 0421 05/19/22 0930  BP: (!) 130/93 124/77 (!) 143/87 104/76  Pulse: (!) 116 90 98 (!) 105  Resp: 18  18 18   Temp: 98.8 F (37.1 C) 98.9 F (37.2 C) 98.5 F (36.9 C) 98.4 F (36.9 C)  TempSrc: Oral Oral Oral   SpO2: 98% 99% 99% 99%  Weight:      Height:       Supplemental O2: Room Air SpO2: 99 %   Physical Exam:  Constitutional: Resting in bed, no acute distress HENT: normocephalic atraumatic Eyes: conjunctiva non-erythematous Cardiovascular: RRR, no murmurs,  rubs, or gallops  Pulmonary/Chest: Poor inspiratory effort.  No crackles, rales, or rhonchi appreciated.  Normal work of breathing on room air.  Extremities: Kerlix dressings starting at malleoli today.  Toes exposed with ointment present.  Minimal purulent drainage appreciated today.  2+ pitting edema bilaterally.  Filed Weights   05/14/22 1718 05/15/22 0636  Weight: (!) 164 kg (!) 159.1 kg     Intake/Output Summary (Last 24 hours) at 05/19/2022 1633 Last data filed at 05/19/2022 1300 Gross per 24 hour  Intake 680 ml  Output 900 ml  Net -220 ml   Net IO Since Admission: 1,265 mL [05/19/22 1633]  Pertinent Labs:    Latest Ref Rng & Units 05/19/2022    4:07 AM 05/18/2022    2:04 AM 05/17/2022    4:24 AM  CBC  WBC 4.0 - 10.5 K/uL 5.1  6.2  7.4   Hemoglobin 13.0 - 17.0 g/dL 10.9  10.9  10.8   Hematocrit 39.0 - 52.0 % 36.2  36.6  35.4   Platelets 150 - 400 K/uL 238  218  242        Latest Ref Rng & Units 05/19/2022    4:07 AM 05/18/2022    2:04 AM 05/17/2022    4:24 AM  CMP  Glucose 70 - 99 mg/dL 99  101  100   BUN 8 - 23 mg/dL 32  22  19   Creatinine 0.61 - 1.24 mg/dL 3.51  2.76  2.45   Sodium 135 - 145 mmol/L 136  137  138   Potassium 3.5 - 5.1 mmol/L 3.8  3.9  3.8   Chloride 98 - 111 mmol/L 105  106  104   CO2 22 - 32 mmol/L 19  19  22    Calcium 8.9 - 10.3 mg/dL 9.1  9.4  9.5     Imaging: No results found.  Assessment/Plan:   Principal Problem:   Localized swelling of both lower legs Active Problems:   Bacteremia due to Klebsiella pneumoniae   Cellulitis of right lower extremity   Cellulitis of left lower extremity   AKI (acute kidney injury) ([place])   Patient Summary: Larry [name] is a 72 y.o. with a pertinent PMH of venous insufficiency, hypertension, hyperlipidemia, CAD, COPD who presents with concerns of right lower extremity pain and swelling.  Concern for sepsis, patient had blood cultures which resulted positive for Proteus. Patient admitted for  further evaluation and management of Proteus bacteremia.  #Proteus bacteremia #Bilateral lower extremity cellulitis Patient evaluated this morning.  Patient remains afebrile.  On exam, lower extremities improving.  Will continue with Augmentin treatment.  Will likely need 2 more days of oral antibiotics.  -Continue Augmentin day 2 (day 5 of total antibiotics) -Monitor fever curve -Monitor white count  #Venous insufficiency #Venous stasis dermatitis with ulceration #Deconditioning Patient continues to follow with wound care.  Patient has Kerlix dressings in place.  Continue to apply Aquacel.  Did speak to patient's wife about podiatry, and patient had been following [facility] care, and patient stopped going.  Patient's wife does state that he can go back there, and she will unsure of this.  Patient continues to follow with wound care.  Did want to evaluate patient's pain today, and I am unclear on how to characterize this pain.  It does not seem neuropathic in nature given he denies any pins-and-needles or burning sensation.  It does not seem to be localized to a joint.  He states it travels up to his mid shin bilaterally, and does not have any certain dermatome patterns.  I do think this is arthritic in nature.  I wonder if this is from deconditioning over time causing weakness in his muscles, and pain from his lymphedema. -Continue to monitor -Apply Aquacel -Continue with wound care -Kerlix dressings -Multimodal pain control with Tylenol 1000 mg every 8 hours, gabapentin 200 mg twice daily, and oxycodone 10 mg every 6 hours as needed  #CHF #NYHA Class III #Ischemic cardiomyopathy #Hypertension No acute concern for exacerbation at this time.  Blood pressure remains normal tensive today.  Tachycardia has improved.  Given bump creatinine, will hold Lasix  -Hold losartan 25 mg daily, diltiazem 60 mg every 8 hours -Continue home metoprolol 37.5 mg twice daily -Hold  Lasix 60 mg daily -Hold Jardiance 10 mg daily -As blood pressure started to respond, can start slowly reintroducing other GDMT -Continue to monitor volume status and blood pressure  #AKI on CKD stage IIIb Creatinine bumped up to 3.51 today.  Urine output yesterday 400 mL.  Likely overdiuresis, will provide fluids today.  Given history of poor p.o. intake, likely patient is dehydrated. -500 cc bolus -Monitor kidney function  #COPD No acute concerns for exacerbations -Continue home albuterol as needed  Diet: Heart Healthy IVF: None,None VTE: Enoxaparin  Code: Full PT/OT recs: SNF for Subacute PT  Dispo: Anticipated discharge to Rehab versus SNF versus home in 2 days pending clinical improvement and SNF placement   Leigh Aurora, DO Internal Medicine Resident PGY-1 [phone] Please contact the on call pager after 5 pm and on weekends at [phone].

## 2022-05-19 NOTE — TOC Initial Note (Signed)
Transition of Care [facility]) - Initial/Assessment Note    Patient Details  Name: Larry [name] MRN: [id number] Date of Birth: March 02, 1950  Transition of Care [facility]) CM/SW Contact:    Milinda Antis, [place] Phone Number: 05/19/2022, 11:18 AM  Clinical Narrative:                 CSW received consult for possible SNF placement at time of discharge. CSW spoke with patient. Patient expressed understanding of PT recommendation and requested to speak with spouse prior to making a decision about whether to discharge to a SNF.  CSW discussed insurance authorization process and provided Medicare SNF ratings list for patient and spouse to review.    TOC following.  Expected Discharge Plan: Skilled Nursing Facility Barriers to Discharge: Continued Medical Work up   Patient Goals and CMS Choice   CMS Medicare.gov Compare Post Acute Care list provided to:: Patient Choice offered to / list presented to : Patient      Expected Discharge Plan and Services In-house Referral: Clinical Social Work     Living arrangements for the past 2 months: Single Family Home                                      Prior Living Arrangements/Services Living arrangements for the past 2 months: Single Family Home Lives with:: Spouse Patient language and need for interpreter reviewed:: Yes Do you feel safe going back to the place where you live?: Yes      Need for Family Participation in Patient Care: Yes (Comment) Care giver support system in place?: Yes (comment)   Criminal Activity/Legal Involvement Pertinent to Current Situation/Hospitalization: No - Comment as needed  Activities of Daily Living Home Assistive Devices/Equipment: Ostomy supplies, Walker (specify type), Eyeglasses ADL Screening (condition at time of admission) Patient's cognitive ability adequate to safely complete daily activities?: Yes Is the patient deaf or have difficulty hearing?: No Does the patient have difficulty seeing,  even when wearing glasses/contacts?: No Does the patient have difficulty concentrating, remembering, or making decisions?: No Patient able to express need for assistance with ADLs?: Yes Does the patient have difficulty dressing or bathing?: Yes Independently performs ADLs?: No Communication: Independent Dressing (OT): Needs assistance Is this a change from baseline?: Change from baseline, expected to last >3 days Grooming: Needs assistance Is this a change from baseline?: Change from baseline, expected to last >3 days Feeding: Independent Bathing: Needs assistance Is this a change from baseline?: Change from baseline, expected to last >3 days Toileting: Needs assistance Is this a change from baseline?: Change from baseline, expected to last >3days In/Out Bed: Needs assistance Is this a change from baseline?: Change from baseline, expected to last >3 days Walks in Home: Needs assistance Is this a change from baseline?: Change from baseline, expected to last >3 days Does the patient have difficulty walking or climbing stairs?: Yes Weakness of Legs: Both Weakness of Arms/Hands: None  Permission Sought/Granted   Permission granted to share information with : Yes, Verbal Permission Granted              Emotional Assessment Appearance:: Appears stated age Attitude/Demeanor/Rapport: Engaged Affect (typically observed): Stoic Orientation: : Oriented to Situation, Oriented to  Time, Oriented to Place, Oriented to Self Alcohol / Substance Use: Not Applicable Psych Involvement: No (comment)  Admission diagnosis:  Hypokalemia [E87.6] Cellulitis of left lower extremity [L03.116] Cellulitis of right lower extremity [  L03.115] Localized swelling of both lower legs [R22.43] Stage 3b chronic kidney disease ([place]) [N18.32] Patient Active Problem List   Diagnosis Date Noted   Bacteremia due to Klebsiella pneumoniae 05/16/2022   Cellulitis of right lower extremity 05/16/2022   Cellulitis of  left lower extremity 05/16/2022   Localized swelling of both lower legs 05/14/2022   Tonsillitis 10/28/2018   Pharyngitis 10/27/2018   Poor dentition 10/27/2018   Sepsis due to urinary tract infection ([place]) 06/30/2017   Acute kidney injury superimposed on chronic kidney disease III ([place]) 06/30/2017   Coronary artery disease 06/30/2017   Hypertension 06/30/2017   COPD mixed type ([place]) 06/30/2017   Venous stasis 06/30/2017   Ectopic atrial tachycardia 06/30/2017   History of colostomy reversal 06/30/2017   Hydronephrosis of left kidney with hydroureter 06/30/2017   Sepsis secondary to UTI ([place]) 06/30/2017   Genetic testing 08/08/2016   Acute pulmonary edema (HCC)    Acute respiratory failure with hypoxia (HCC)    SBO (small bowel obstruction) ([place])    Encounter for nasogastric tube placement    Abdominal pain    Hypervolemia    Pressure injury of skin 04/25/2016   Acute respiratory failure (HCC)    Adynamic ileus ([place])    Peritonitis ([place])    Colostomy in place ([place]) 04/12/2016   Pulmonary infiltrate present on computed tomography 11/15/2015   Cough 11/14/2015   Lung nodule    Atrial tachycardia    Chronic diastolic CHF (congestive heart failure) (HCC)    Palpitations    Dyspnea and respiratory abnormalities    Family history of ovarian cancer    CKD (chronic kidney disease), stage III ([place]) 06/12/2014   Thrombocytopenia ([place])    Ischemic chest pain ([place]) 06/11/2014   Atherosclerosis of artery of extremity with ulceration ([place]) 06/06/2014   Venous stasis ulcer of left lower extremity ([place]) 06/06/2014   Acute respiratory distress 09/30/2013   Respiratory distress 09/30/2013   Peripheral arterial disease ([place]) 08/21/2013   Postop check 03/26/2013   Physical deconditioning 03/01/2013   Torsades de pointes ([place]) 02/27/2013   Acute on chronic diastolic heart failure ([place]) 02/15/2013   Colon cancer ([place]) 02/09/2013   Atrial fibrillation ([place]) 02/05/2013   Acute renal failure  ([place]) 02/02/2013   Hypotension 02/02/2013   Chronic blood loss anemia 02/02/2013   Edema 06/16/2011   Tachycardia 06/16/2011   IMPOTENCE OF ORGANIC ORIGIN 01/12/2009   Obstructive sleep apnea 12/04/2008   HYPERLIPIDEMIA 12/03/2008   Morbid obesity due to excess calories ([place]) 12/03/2008   Coronary atherosclerosis 12/03/2008   CARDIOMYOPATHY, ISCHEMIC 12/03/2008   Atrial tachycardia, paroxysmal 12/03/2008   ASTHMA 12/03/2008   Essential hypertension 12/02/2008   PCP:  Lin Landsman, MD Pharmacy[facility]   [id number] [place], [place] [address] [place] [address] Phone: [phone] Fax: [phone]     Social Determinants of Health (SDOH) Social History: SDOH Screenings   Food Insecurity: No Food Insecurity (05/15/2022)  Housing: Low Risk  (05/15/2022)  Transportation Needs: No Transportation Needs (05/15/2022)  Utilities: Not At Risk (05/15/2022)  Tobacco Use: Medium Risk (05/15/2022)   SDOH Interventions:     Readmission Risk Interventions     No data to display

## 2022-05-20 DIAGNOSIS — B964 Proteus (mirabilis) (morganii) as the cause of diseases classified elsewhere: Secondary | ICD-10-CM | POA: Diagnosis not present

## 2022-05-20 DIAGNOSIS — L03115 Cellulitis of right lower limb: Secondary | ICD-10-CM | POA: Diagnosis not present

## 2022-05-20 DIAGNOSIS — L03116 Cellulitis of left lower limb: Secondary | ICD-10-CM | POA: Diagnosis not present

## 2022-05-20 DIAGNOSIS — R7881 Bacteremia: Secondary | ICD-10-CM | POA: Diagnosis not present

## 2022-05-20 LAB — BASIC METABOLIC PANEL
Anion gap: 14 (ref 5–15)
BUN: 41 mg/dL — ABNORMAL HIGH (ref 8–23)
CO2: 19 mmol/L — ABNORMAL LOW (ref 22–32)
Calcium: 9.4 mg/dL (ref 8.9–10.3)
Chloride: 103 mmol/L (ref 98–111)
Creatinine, Ser: 3.55 mg/dL — ABNORMAL HIGH (ref 0.61–1.24)
GFR, Estimated: 18 mL/min — ABNORMAL LOW (ref >60)
Glucose, Bld: 91 mg/dL (ref 70–99)
Potassium: 3.9 mmol/L (ref 3.5–5.1)
Sodium: 136 mmol/L (ref 135–145)

## 2022-05-20 LAB — CBC
HCT: 35.3 % — ABNORMAL LOW (ref 39.0–52.0)
Hemoglobin: 10.7 g/dL — ABNORMAL LOW (ref 13.0–17.0)
MCH: 27.4 pg (ref 26.0–34.0)
MCHC: 30.3 g/dL (ref 30.0–36.0)
MCV: 90.5 fL (ref 80.0–100.0)
Platelets: 254 10*3/uL (ref 150–400)
RBC: 3.9 MIL/uL — ABNORMAL LOW (ref 4.22–5.81)
RDW: 14.5 % (ref 11.5–15.5)
WBC: 5.6 10*3/uL (ref 4.0–10.5)
nRBC: 0 % (ref 0.0–0.2)

## 2022-05-20 MED ORDER — AMOXICILLIN-POT CLAVULANATE 500-125 MG PO TABS: 1.0000 | ORAL_TABLET | Freq: Two times a day (BID) | ORAL | Status: DC

## 2022-05-20 MED ORDER — LACTATED RINGERS IV BOLUS: 1000.0000 mL | Freq: Once | INTRAVENOUS | Status: DC

## 2022-05-20 MED ORDER — AMOXICILLIN-POT CLAVULANATE 500-125 MG PO TABS
1.0000 | ORAL_TABLET | Freq: Two times a day (BID) | ORAL | Status: AC
  Administered 2022-05-20 – 2022-05-21 (×3): 1 via ORAL
  Filled 2022-05-20 (×3): qty 1

## 2022-05-20 MED ORDER — LACTATED RINGERS IV BOLUS
1000.0000 mL | Freq: Once | INTRAVENOUS | Status: AC
  Administered 2022-05-20: 1000 mL via INTRAVENOUS

## 2022-05-20 NOTE — TOC Initial Note (Signed)
Transition of Care [facility]) - Initial/Assessment Note    Patient Details  Name: Larry [name] MRN: [id number] Date of Birth: 1950/10/16  Transition of Care [facility]) CM/SW Contact:    Milinda Antis, [place] Phone Number: 05/20/2022, 2:12 PM  Clinical Narrative:                 CSW received consult for possible SNF placement at time of discharge. CSW spoke with patient's spouse with patient in room.  Family expressed understanding of PT recommendation and is agreeable to SNF placement at time of discharge. Patient and spouse reports preference for [facility] or [facility] . CSW discussed insurance authorization process and provided Medicare SNF ratings list. CSW will send out referrals for review and provide bed offers as available.   Skilled Nursing Rehab Facilities-   [email or web address]   Ratings out of 5 stars (5 the highest)   Name Address  Phone # [place] Inspection Overall  [facility] [address], [place] 4 5 2 3   [facility]  [address] [place], [place] [phone] 4 2 5 5   [facility] [place], [place] 1 3 1 1   [place], [place] 2 2 4 4   [facility] [address], [place] 2 1 2 1   [place] [address], [place] 3 3 4 4   [facility] [address] [place] 4 1 3 2   [facility] [address] [place] 4 1 3 2   [address] ([place]) [place], [address] [phone] 3 1 2 1   [facility] [address], [facility] [phone] 3 1 1 1   [facility] [address] [facility] [phone] 3 2 2 2   [facility]) [place]. [name], [place] [phone] 3 1 1 1   [place] [address] [place] [address] 4 2 4 4           [place] 4 1 3 2   [facility] [place] [address], [place] 3 1 5 4   [facility], [place], [address] [phone] 1 1 2 1   [facility] [place] [address], [facility] [phone] 2 2 4 4           [address] (no [facility]) [place] [facility], [place] [phone] 5 5 5 5   [facility] (No Humana) [address], [place] 4 1 4 3   [place] (No UHC) [place], [place] 5 5 5 5   [facility] [address], [facility] [phone] 2 3 5 5   [facility] [place] [address], [place] 1 1 2 1   [place] [address] 3 1 1 1   [place], [place] 5 2 5 5   [facility]  [address] [place] 2 2 1 1   [facility] [address] [place] 3 2 1 1   [facility] [place], [place] 2 2 2 2           [facility] [address] [place] 1 1 1 1   [place] [address], [address]  [phone] 2 4 3 3   [facility] [place] [address], [place] [phone] 3 2 3  3  [place] [address], [place] 2 1 1 1   [place] (No Humana) [address] [place] [phone] 2 2 3 3   [facility]) [address], [place] [phone] 2 1 1 1           [facility] [place], [place] 5 4 5 5   [facility])  [address], [place] 2 1 2 1   [facility]) [place] [address], [place] 3 1 4 3   [place] [address] [place] 3 3 4 4   [address] [place], [place] 2 3 1 1   [facility]) [address] [place] [phone] 2 1  4 3     Expected Discharge Plan: [place] Barriers to Discharge: Continued Medical Work up   Patient Goals and CMS Choice   CMS Medicare.gov Compare Post Acute Care list provided to:: Patient Choice offered to / list presented to : Patient      Expected Discharge Plan and Services In-house Referral: Clinical Social Work     Living arrangements for the past 2 months: Single Family Home                                      Prior Living Arrangements/Services Living arrangements for the past 2 months: Single Family Home Lives with:: Spouse Patient language and need for interpreter reviewed:: Yes Do you feel safe going back to the place where you live?: Yes      Need for Family Participation in Patient Care: Yes (Comment) Care giver support system in place?: Yes (comment)   Criminal Activity/Legal Involvement Pertinent to Current Situation/Hospitalization: No - Comment as needed  Activities of Daily Living Home Assistive Devices/Equipment: Ostomy supplies, Walker (specify type), Eyeglasses ADL Screening (condition at time of admission) Patient's cognitive ability adequate to safely complete daily activities?: Yes Is the patient deaf or have difficulty hearing?: No Does the patient have difficulty seeing, even when wearing glasses/contacts?: No Does the patient have difficulty concentrating, remembering, or making decisions?: No Patient able to express need for assistance with ADLs?: Yes Does the patient have difficulty dressing or bathing?: Yes Independently performs ADLs?: No Communication: Independent Dressing (OT): Needs assistance Is this a change from baseline?: Change from baseline, expected to last >3 days Grooming: Needs assistance Is this a change from baseline?: Change from baseline, expected to last >3 days Feeding: Independent Bathing: Needs assistance Is this a change from baseline?: Change from baseline, expected to last >3  days Toileting: Needs assistance Is this a change from baseline?: Change from baseline, expected to last >3days In/Out Bed: Needs assistance Is this a change from baseline?: Change from baseline, expected to last >3 days Walks in Home: Needs assistance Is this a change from baseline?: Change from baseline, expected to last >3 days Does the patient have difficulty walking or climbing stairs?: Yes Weakness of Legs: Both Weakness of Arms/Hands: None  Permission Sought/Granted   Permission granted to share information with : Yes, Verbal Permission Granted              Emotional Assessment Appearance:: Appears stated age Attitude/Demeanor/Rapport: Engaged Affect (typically observed): Stoic Orientation: : Oriented to Situation, Oriented to  Time, Oriented to Place, Oriented to Self Alcohol / Substance Use: Not Applicable Psych Involvement: No (comment)  Admission diagnosis:  Hypokalemia [E87.6] Cellulitis of left lower extremity [L03.116] Cellulitis of right lower extremity [L03.115] Localized swelling of both lower legs [R22.43] Stage 3b chronic kidney disease [N18.32] Patient Active Problem List   Diagnosis Date Noted   AKI (acute kidney injury) ([place]) 05/19/2022   Bacteremia due to Klebsiella pneumoniae 05/16/2022   Cellulitis of right lower extremity 05/16/2022   Cellulitis of left lower extremity 05/16/2022   Localized swelling of both lower legs 05/14/2022   Tonsillitis 10/28/2018   Pharyngitis 10/27/2018   Poor dentition 10/27/2018   Sepsis due to urinary tract infection ([place]) 06/30/2017   Acute kidney injury superimposed on chronic kidney disease III ([place]) 06/30/2017   Coronary artery disease 06/30/2017   Hypertension 06/30/2017   COPD mixed type ([place]) 06/30/2017   Venous stasis 06/30/2017   Ectopic atrial tachycardia 06/30/2017   History of colostomy reversal 06/30/2017   Hydronephrosis of left kidney with hydroureter 06/30/2017   Sepsis secondary to UTI ([place])  06/30/2017   Genetic testing 08/08/2016   Acute pulmonary edema (HCC)    Acute respiratory failure with hypoxia (HCC)    SBO (small bowel obstruction) ([place])    Encounter for nasogastric tube placement    Abdominal pain    Hypervolemia    Pressure injury of skin 04/25/2016   Acute respiratory failure (HCC)    Adynamic ileus ([place])    Peritonitis ([place])    Colostomy in place ([place]) 04/12/2016   Pulmonary infiltrate present on computed tomography 11/15/2015   Cough 11/14/2015   Lung nodule    Atrial tachycardia    Chronic diastolic CHF (congestive heart failure) (HCC)    Palpitations    Dyspnea and respiratory abnormalities    Family history of ovarian cancer    CKD (chronic kidney disease), stage III ([place]) 06/12/2014   Thrombocytopenia ([place])    Ischemic chest pain ([place]) 06/11/2014   Atherosclerosis of artery of extremity with ulceration ([place]) 06/06/2014   Venous stasis ulcer of left lower extremity ([place]) 06/06/2014   Acute respiratory distress 09/30/2013   Respiratory distress 09/30/2013   Peripheral arterial disease ([place]) 08/21/2013   Postop check 03/26/2013   Physical deconditioning 03/01/2013   Torsades de pointes ([place]) 02/27/2013   Acute on chronic diastolic heart failure ([place]) 02/15/2013   Colon cancer ([place]) 02/09/2013   Atrial fibrillation ([place]) 02/05/2013   Acute renal failure ([place]) 02/02/2013   Hypotension 02/02/2013   Chronic blood loss anemia 02/02/2013   Edema 06/16/2011   Tachycardia 06/16/2011   IMPOTENCE OF ORGANIC ORIGIN 01/12/2009   Obstructive sleep apnea 12/04/2008   HYPERLIPIDEMIA 12/03/2008   Morbid obesity due to excess calories ([place]) 12/03/2008   Coronary atherosclerosis 12/03/2008   CARDIOMYOPATHY, ISCHEMIC 12/03/2008   Atrial tachycardia, paroxysmal 12/03/2008   ASTHMA 12/03/2008   Essential hypertension 12/02/2008   PCP:  Lin Landsman, MD Pharm[facility]   [id number] [place], [place] [address]  [place] [address] Phone: [phone] Fax: [phone]     Social Determinants of Health (SDOH) Social History: SDOH Screenings   Food Insecurity: No Food Insecurity (05/15/2022)  Housing: Low Risk  (05/15/2022)  Transportation Needs: No Transportation Needs (05/15/2022)  Utilities: Not At Risk (05/15/2022)  Tobacco Use: Medium Risk (05/15/2022)   SDOH Interventions:     Readmission Risk Interventions     No data to display

## 2022-05-20 NOTE — Progress Notes (Signed)
Physical Therapy Treatment Patient Details Name: Larry [name] MRN: [id number] DOB: [birth year] Today's Date: 05/20/2022   History of Present Illness Patient is a 72 yo male presenting to the ED with BLE swelling, increased weakness and pain with an inability to walk on 05/14/22. Admitted same day with cellulitis; R dorsal foot wound, bil ankle indentations at sock cuffs;  PMH includes: HTN, HLD, CAD, COPD, obesity, NSVT, CKD, CHF, colon cancer s/p resection and colostomy placement, and sleep apnea    PT Comments    Pt greeted supine in bed and agreeable to session with steady progress towards acute goals. Pt with good participation and able to initiate all mobility this date, needing min A to being BLEs to and off EOB, pt then able to reach and pull with bedrails to partially elevate trunk with max A, however pt unable to tolerate full upright sitting this date due to abdominal pain with increased hip flexion. Pt return to supine and able to complete LE exercise with cues for technique. Pt educated on importance of elevating HOB for upright sitting tolerance and continuing LE exercises throughout day between therapies with pt verbalizing understanding. Pt continues to benefit from skilled PT services to progress toward functional mobility goals.    Recommendations for follow up therapy are one component of a multi-disciplinary discharge planning process, led by the attending physician.  Recommendations may be updated based on patient status, additional functional criteria and insurance authorization.  Follow Up Recommendations       Assistance Recommended at Discharge Intermittent Supervision/Assistance  Patient can return home with the following Two people to help with walking and/or transfers;Two people to help with bathing/dressing/bathroom;Assist for transportation;Help with stairs or ramp for entrance   Equipment Recommendations  Rolling walker (2 wheels);BSC/3in1;Wheelchair  (measurements PT);Wheelchair cushion (measurements PT) ([facility] and [facility]; consider 22-24 inch wide WC/cushion -- can consider hospital bed; will continue to discern based on progress with therapies)    Recommendations for Other Services       Precautions / Restrictions Precautions Precautions: Fall Restrictions Weight Bearing Restrictions: No     Mobility  Bed Mobility Overal bed mobility: Needs Assistance Bed Mobility: Supine to Sit, Rolling, Sit to Supine     Supine to sit: Max assist Sit to supine: Total assist, Max assist   General bed mobility comments: max A to come to partial sitting on EOB, pt able to initiate BLEs to EOB with cueing for sequencing and reach across body to pull on bedrail, max A to elevate trunk to sitting with pt unable to tolerate full upright sitting due to abdomnial pain with increased hip flexion in sitting, returned to suping with total A to BLEs    Transfers                   General transfer comment: declining    Ambulation/Gait                   Stairs             Wheelchair Mobility    Modified Rankin (Stroke Patients Only)       Balance Overall balance assessment: Needs assistance Sitting-balance support: Bilateral upper extremity supported, Feet supported, Feet unsupported (Portion of time sitting EOB with feet unsupported; Portion of time with feet support on floor minimally) Sitting balance-Leahy Scale: Zero Sitting balance - Comments: Max assist and support at back to maintain sitting; unable to tolerate full upright sitting this date due to abdominal pain  Postural control: Posterior lean (Heavy)                                  Cognition Arousal/Alertness: Awake/alert Behavior During Therapy: WFL for tasks assessed/performed Overall Cognitive Status: Within Functional Limits for tasks assessed                                          Exercises General Exercises - Lower  Extremity Heel Slides: AROM, AAROM, Right, Left, 10 reps, Supine Hip ABduction/ADduction: AROM, Right, Left, 10 reps, Supine Other Exercises Other Exercises: long axis hip internal/external roation x10 ea side    General Comments General comments (skin integrity, edema, etc.): VSS on RA, noted that pt colostomy pouch at max capcaity with gas, RN notified of pouch and abdominal pain      Pertinent Vitals/Pain Pain Assessment Pain Assessment: Faces Faces Pain Scale: Hurts whole lot Pain Location: R lower abdomen with hip flexion Pain Descriptors / Indicators: Discomfort, Grimacing, Guarding, Sharp Pain Intervention(s): Monitored during session, Limited activity within patient's tolerance, Repositioned    Home Living                          Prior Function            PT Goals (current goals can now be found in the care plan section) Acute Rehab PT Goals PT Goal Formulation: With patient Time For Goal Achievement: 05/23/22 Progress towards PT goals: Progressing toward goals    Frequency    Min 3X/week      PT Plan      Co-evaluation              AM-PAC PT "6 Clicks" Mobility   Outcome Measure  Help needed turning from your back to your side while in a flat bed without using bedrails?: A Lot Help needed moving from lying on your back to sitting on the side of a flat bed without using bedrails?: Total Help needed moving to and from a bed to a chair (including a wheelchair)?: Total Help needed standing up from a chair using your arms (e.g., wheelchair or bedside chair)?: Total Help needed to walk in hospital room?: Total Help needed climbing 3-5 steps with a railing? : Total 6 Click Score: 7    End of Session Equipment Utilized During Treatment: Other (comment) (bed pads under BLEs) Activity Tolerance: Patient limited by pain Patient left: in bed;with call bell/phone within reach;with bed alarm set Nurse Communication: Mobility status;Other (comment)  (pouch filled with gas) PT Visit Diagnosis: Unsteadiness on feet (R26.81);Other abnormalities of gait and mobility (R26.89);Muscle weakness (generalized) (M62.81);History of falling (Z91.81);Pain;Other (comment) (decr skin integrity) Pain - Right/Left: Right Pain - part of body: Leg;Ankle and joints of foot (also soreness L LE)     Time: [id number] PT Time Calculation (min) (ACUTE ONLY): 18 min  Charges:  $Therapeutic Activity: 8-22 mins                     Damein Gaunce R. PTA Acute Rehabilitation Services Office: [place] 05/20/2022, 2:15 PM

## 2022-05-20 NOTE — Progress Notes (Signed)
HD#6 Subjective:   Summary: This is a 72 year old male with a past medical history of venous insufficiency, hypertension, hyperlipidemia, CAD who presents with worsening lower extremity edema and pain.  Patient admitted for sepsis, found to have Proteus bacteremia.  Patient admitted for further evaluation and management.  Overnight Events: No overnight events  Patient evaluated bedside.  He states he is still having some pain.  He denies eating and drinking well.  He does not have an appetite.  He has no other concerns this morning.  He states he will go over sniffs with his wife today.  Objective:  Vital signs in last 24 hours: Vitals:   05/19/22 2215 05/20/22 0505 05/20/22 0519 05/20/22 0943  BP: 128/66 133/85 106/80 132/71  Pulse: 98 (!) 117 94 94  Resp:    18  Temp:  98.4 F (36.9 C) 98.4 F (36.9 C) 98.4 F (36.9 C)  TempSrc:  Oral Oral   SpO2: 98% 97% 99% 99%  Weight:      Height:       Supplemental O2: Room Air SpO2: 99 %   Physical Exam:  Constitutional: Resting in bed, no acute distress HENT: normocephalic atraumatic Eyes: conjunctiva non-erythematous Cardiovascular: RRR, no murmurs, rubs, or gallops  Pulmonary/Chest: Normal work of breathing on room air.  No crackles, rales, rhonchi is appreciated. Extremities: Kerlix dressing in place today.  There is some drainage noted to right dorsal aspect of right foot.  Otherwise ointments on feet.  Filed Weights   05/14/22 1718 05/15/22 0636 05/19/22 2142  Weight: (!) 164 kg (!) 159.1 kg (!) 150.7 kg     Intake/Output Summary (Last 24 hours) at 05/20/2022 1418 Last data filed at 05/20/2022 0943 Gross per 24 hour  Intake 360 ml  Output 1650 ml  Net -1290 ml   Net IO Since Admission: -475 mL [05/20/22 1418]  Pertinent Labs:    Latest Ref Rng & Units 05/20/2022    4:34 AM 05/19/2022    4:07 AM 05/18/2022    2:04 AM  CBC  WBC 4.0 - 10.5 K/uL 5.6  5.1  6.2   Hemoglobin 13.0 - 17.0 g/dL 10.7  10.9  10.9    Hematocrit 39.0 - 52.0 % 35.3  36.2  36.6   Platelets 150 - 400 K/uL 254  238  218        Latest Ref Rng & Units 05/20/2022    4:34 AM 05/19/2022    4:07 AM 05/18/2022    2:04 AM  CMP  Glucose 70 - 99 mg/dL 91  99  101   BUN 8 - 23 mg/dL 41  32  22   Creatinine 0.61 - 1.24 mg/dL 3.55  3.51  2.76   Sodium 135 - 145 mmol/L 136  136  137   Potassium 3.5 - 5.1 mmol/L 3.9  3.8  3.9   Chloride 98 - 111 mmol/L 103  105  106   CO2 22 - 32 mmol/L 19  19  19    Calcium 8.9 - 10.3 mg/dL 9.4  9.1  9.4     Imaging: No results found.  Assessment/Plan:   Principal Problem:   Localized swelling of both lower legs Active Problems:   Bacteremia due to Klebsiella pneumoniae   Cellulitis of right lower extremity   Cellulitis of left lower extremity   AKI (acute kidney injury) ([place])   Patient Summary: Larry [name] is a 72 y.o. with a pertinent PMH of venous insufficiency, hypertension, hyperlipidemia, CAD,  COPD who presents with concerns of right lower extremity pain and swelling.  Concern for sepsis, patient had blood cultures which resulted positive for Proteus. Patient admitted for further evaluation and management of Proteus bacteremia.  #Proteus bacteremia #Bilateral lower extremity cellulitis Patient evaluated this morning.  Patient remains afebrile.  Lower extremities wrapped well.  Today is total day 6 of antibiotics.  Day 3 of Augmentin.  Patient continues to improve from an infection standpoint.   -Continue Augmentin day 3 (day 6 of total antibiotics) -Monitor fever curve -Monitor white count  #Venous insufficiency #Venous stasis dermatitis with ulceration #Deconditioning Patient to have bilateral lower extremities rewrapped.  On exam, there is some drainage noted to right dorsal region of lower extremity.  Patient has not been up and moving, but he is still having lots of pain.  Encourage patient to get up and move.  Patient met with his wife at bedside.  They chose some  SNFs. Continue with Kerlix and Aquacel.  Patient will be followed up with his podiatrist at [place] [facility].  Pain likely secondary to chronic venous insufficiency.  Did ask the patient about why he has not been taking oxycodone, in which patient stated he does not like taking pain medications, and he wants to push to the pain. -Continue to monitor -Apply Aquacel -Continue with wound care -Kerlix dressings -Multimodal pain control with Tylenol 1000 mg every 8 hours, gabapentin 200 mg twice daily, and oxycodone 10 mg every 6 hours as needed -Continue to work with PT/OT  #CHF #NYHA Class III #Ischemic cardiomyopathy #Hypertension No acute concern for exacerbation at this time.  Blood pressure remains normal tensive today.  Tachycardia is improving.  Holding Lasix given bump in creatinine. -Hold losartan 25 mg daily, diltiazem 60 mg every 8 hours -Continue home metoprolol 37.5 mg twice daily -Hold Lasix 60 mg daily -Hold Jardiance 10 mg daily -As blood pressure rises and kidneys improve, can start slowly reintroducing other GDMT -Continue to monitor volume status and blood pressure  #AKI on CKD stage IIIb #NAGMA Creatinine bumped up to 3.55 today.  Bicarb remains at 19.  Likely bump in creatinine due to dehydration.  Will give more fluids today.  NAGMA likely in the setting of chronic renal insufficiency.  In the outpatient setting, patient could benefit from sodium bicarb.  Will reevaluate in the morning.  -1000 cc bolus -Monitor kidney function  #COPD No acute concerns for exacerbations -Continue home albuterol as needed  Diet: Heart Healthy IVF: None,None VTE: Enoxaparin Code: Full PT/OT recs: SNF for Subacute PT  Dispo: Anticipated discharge to SNF in 2 days pending clinical improvement and SNF placement   Leigh Aurora, DO Internal Medicine Resident PGY-1 [phone] Please contact the on call pager after 5 pm and on weekends at [phone].

## 2022-05-20 NOTE — NC FL2 (Signed)
[place] LEVEL OF CARE FORM     IDENTIFICATION  Patient Name: Larry [name] Birthdate: November 01, 1950 Sex: male Admission Date (Current Location): 05/14/2022  [facility] and [place] Number:  Herbalist and Address:  [facility] [place] [place] [address] [place], [place] [address]      Provider Number: [id number]  Attending Physician Name and Address:  Lottie Mussel, MD  Relative Name and Phone Number:  Mitsuo, Egloff (Spouse) [phone]    Current Level of Care: Hospital Recommended Level of Care: [place] Prior Approval Number:    Date Approved/Denied:   PASRR Number: [id number]  Discharge Plan: SNF    Current Diagnoses: Patient Active Problem List   Diagnosis Date Noted   AKI (acute kidney injury) ([place]) 05/19/2022   Bacteremia due to Klebsiella pneumoniae 05/16/2022   Cellulitis of right lower extremity 05/16/2022   Cellulitis of left lower extremity 05/16/2022   Localized swelling of both lower legs 05/14/2022   Tonsillitis 10/28/2018   Pharyngitis 10/27/2018   Poor dentition 10/27/2018   Sepsis due to urinary tract infection ([place]) 06/30/2017   Acute kidney injury superimposed on chronic kidney disease III ([place]) 06/30/2017   Coronary artery disease 06/30/2017   Hypertension 06/30/2017   COPD mixed type ([place]) 06/30/2017   Venous stasis 06/30/2017   Ectopic atrial tachycardia 06/30/2017   History of colostomy reversal 06/30/2017   Hydronephrosis of left kidney with hydroureter 06/30/2017   Sepsis secondary to UTI ([place]) 06/30/2017   Genetic testing 08/08/2016   Acute pulmonary edema ([place])    Acute respiratory failure with hypoxia ([place])    SBO (small bowel obstruction) ([place])    Encounter for nasogastric tube placement    Abdominal pain    Hypervolemia    Pressure injury of skin 04/25/2016   Acute respiratory failure ([place])    Adynamic ileus ([place])    Peritonitis ([place])    Colostomy in place ([place])  04/12/2016   Pulmonary infiltrate present on computed tomography 11/15/2015   Cough 11/14/2015   Lung nodule    Atrial tachycardia    Chronic diastolic CHF (congestive heart failure) (HCC)    Palpitations    Dyspnea and respiratory abnormalities    Family history of ovarian cancer    CKD (chronic kidney disease), stage III ([place]) 06/12/2014   Thrombocytopenia ([place])    Ischemic chest pain ([place]) 06/11/2014   Atherosclerosis of artery of extremity with ulceration ([place]) 06/06/2014   Venous stasis ulcer of left lower extremity ([place]) 06/06/2014   Acute respiratory distress 09/30/2013   Respiratory distress 09/30/2013   Peripheral arterial disease ([place]) 08/21/2013   Postop check 03/26/2013   Physical deconditioning 03/01/2013   Torsades de pointes ([place]) 02/27/2013   Acute on chronic diastolic heart failure ([place]) 02/15/2013   Colon cancer ([place]) 02/09/2013   Atrial fibrillation ([place]) 02/05/2013   Acute renal failure ([place]) 02/02/2013   Hypotension 02/02/2013   Chronic blood loss anemia 02/02/2013   Edema 06/16/2011   Tachycardia 06/16/2011   IMPOTENCE OF ORGANIC ORIGIN 01/12/2009   Obstructive sleep apnea 12/04/2008   HYPERLIPIDEMIA 12/03/2008   Morbid obesity due to excess calories ([place]) 12/03/2008   Coronary atherosclerosis 12/03/2008   CARDIOMYOPATHY, ISCHEMIC 12/03/2008   Atrial tachycardia, paroxysmal 12/03/2008   ASTHMA 12/03/2008   Essential hypertension 12/02/2008    Orientation RESPIRATION BLADDER Height & Weight     Time, Situation, Place, Self  Normal Continent Weight: (!) 332 lb 3.7 oz (150.7 kg) Height:  5\' 11"  (180.3  cm)  BEHAVIORAL SYMPTOMS/MOOD NEUROLOGICAL BOWEL NUTRITION STATUS      Continent, Ileostomy Diet (Heart Healthy)  AMBULATORY STATUS COMMUNICATION OF NEEDS Skin   Total Care Verbally Other (Comment) (1.  non-pressure left ankle; 2.  non pressure right foot; 3.  non pressure R Pretibial)                       Personal Care Assistance Level of  Assistance  Bathing, Feeding, Dressing Bathing Assistance: Maximum assistance Feeding assistance: Independent Dressing Assistance: Maximum assistance     Functional Limitations Info  Sight, Hearing, Speech Sight Info: Impaired Hearing Info: Adequate Speech Info: Adequate    SPECIAL CARE FACTORS FREQUENCY  PT (By licensed PT), OT (By licensed OT)     PT Frequency: 5x/ week OT Frequency: 5x/ week            Contractures Contractures Info: Not present    Additional Factors Info  Code Status, Allergies Code Status Info: Full Allergies Info: NKA           Current Medications (05/20/2022):  This is the current hospital active medication list Current Facility-Administered Medications  Medication Dose Route Frequency Provider Last Rate Last Admin   acetaminophen (TYLENOL) tablet 1,000 mg  1,000 mg Oral Q8H Sanjuan Dame, MD   1,000 mg at 05/20/22 1309   albuterol (PROVENTIL) (2.5 MG/3ML) 0.083% nebulizer solution 2.5 mg  2.5 mg Nebulization Q6H PRN Sid [name], MD       amoxicillin-clavulanate (AUGMENTIN) 500-125 MG per tablet 1 tablet  1 tablet Oral Q12H Lottie Mussel, MD       aspirin EC tablet 81 mg  81 mg Oral Daily Katsadouros, Vasilios, MD   81 mg at 05/20/22 0914   atorvastatin (LIPITOR) tablet 40 mg  40 mg Oral Daily Katsadouros, Vasilios, MD   40 mg at 05/20/22 0914   enoxaparin (LOVENOX) injection 30 mg  30 mg Subcutaneous Q24H Lottie Mussel, MD   30 mg at 05/19/22 2203   ferrous sulfate tablet 325 mg  325 mg Oral Q breakfast Katsadouros, Vasilios, MD   325 mg at 05/20/22 0914   gabapentin (NEURONTIN) capsule 200 mg  200 mg Oral BID Sanjuan Dame, MD   200 mg at 05/20/22 0914   metoprolol tartrate (LOPRESSOR) tablet 37.5 mg  37.5 mg Oral BID Leigh Aurora, DO   37.5 mg at 05/20/22 [id number]   Oral care mouth rinse  15 mL Mouth Rinse PRN Gilles Chiquito B, MD       oxyCODONE (Oxy IR/ROXICODONE) immediate release tablet 10 mg  10 mg Oral Q6H PRN Sanjuan Dame, MD        polyethylene glycol (MIRALAX / GLYCOLAX) packet 17 g  17 g Oral Daily Leigh Aurora, DO   17 g at 05/20/22 0914   tamsulosin (FLOMAX) capsule 0.4 mg  0.4 mg Oral Daily Katsadouros, Vasilios, MD   0.4 mg at 05/20/22 0914   triamcinolone 0.1 % cream : eucerin cream, 1:1   Topical Q1200 Lottie Mussel, MD   Given at 05/19/22 1648   white petrolatum (VASELINE) gel   Topical Daily Riesa Pope, MD   Given at 05/20/22 [id number]     Discharge Medications: Please see discharge summary for a list of discharge medications.  Relevant Imaging Results:  Relevant Lab Results:   Additional Information SSN: [id number],  5'11";  332lbs  Zyler Hyson F Dorna Mallet, LCSWA

## 2022-05-20 NOTE — Progress Notes (Addendum)
PHARMACY NOTE:  ANTIMICROBIAL RENAL DOSAGE ADJUSTMENT  Current antimicrobial regimen includes a mismatch between antimicrobial dosage and estimated renal function.  As per policy approved by the Pharmacy & Therapeutics and Medical Executive Committees, the antimicrobial dosage will be adjusted accordingly.  Current antimicrobial dosage:  Augmentin 250mg  q8h  Indication: bacteremia  Renal Function:  Estimated Creatinine Clearance: 29.3 mL/min (A) (by C-G formula based on SCr of 3.55 mg/dL (H)). []      On intermittent HD, scheduled: []      On CRRT    Antimicrobial dosage has been changed to:  Augmentin 500mg  q12h  Additional comments:   Thank you for allowing pharmacy to be a part of this patient's care.  Dimple Nanas, PharmD, BCPS 05/20/2022 7:25 AM

## 2022-05-21 DIAGNOSIS — L03116 Cellulitis of left lower limb: Secondary | ICD-10-CM | POA: Diagnosis not present

## 2022-05-21 DIAGNOSIS — L03115 Cellulitis of right lower limb: Secondary | ICD-10-CM | POA: Diagnosis not present

## 2022-05-21 DIAGNOSIS — R2243 Localized swelling, mass and lump, lower limb, bilateral: Secondary | ICD-10-CM | POA: Diagnosis not present

## 2022-05-21 DIAGNOSIS — B964 Proteus (mirabilis) (morganii) as the cause of diseases classified elsewhere: Secondary | ICD-10-CM | POA: Diagnosis not present

## 2022-05-21 LAB — BASIC METABOLIC PANEL
Anion gap: 12 (ref 5–15)
BUN: 46 mg/dL — ABNORMAL HIGH (ref 8–23)
CO2: 19 mmol/L — ABNORMAL LOW (ref 22–32)
Calcium: 9.5 mg/dL (ref 8.9–10.3)
Chloride: 104 mmol/L (ref 98–111)
Creatinine, Ser: 3.29 mg/dL — ABNORMAL HIGH (ref 0.61–1.24)
GFR, Estimated: 20 mL/min — ABNORMAL LOW (ref >60)
Glucose, Bld: 93 mg/dL (ref 70–99)
Potassium: 3.6 mmol/L (ref 3.5–5.1)
Sodium: 135 mmol/L (ref 135–145)

## 2022-05-21 LAB — CBC
HCT: 35.5 % — ABNORMAL LOW (ref 39.0–52.0)
Hemoglobin: 10.8 g/dL — ABNORMAL LOW (ref 13.0–17.0)
MCH: 27.3 pg (ref 26.0–34.0)
MCHC: 30.4 g/dL (ref 30.0–36.0)
MCV: 89.6 fL (ref 80.0–100.0)
Platelets: 246 10*3/uL (ref 150–400)
RBC: 3.96 MIL/uL — ABNORMAL LOW (ref 4.22–5.81)
RDW: 14.3 % (ref 11.5–15.5)
WBC: 6 10*3/uL (ref 4.0–10.5)
nRBC: 0 % (ref 0.0–0.2)

## 2022-05-21 MED ORDER — LACTATED RINGERS IV BOLUS
1000.0000 mL | Freq: Once | INTRAVENOUS | Status: AC
  Administered 2022-05-21: 1000 mL via INTRAVENOUS

## 2022-05-21 NOTE — Progress Notes (Signed)
   05/21/22 0753  Assess: MEWS Score  Temp 98.1 F (36.7 C)  BP 136/80  Pulse Rate (!) 118  Resp 18  Level of Consciousness Alert  SpO2 98 %  O2 Device Room Air  Assess: MEWS Score  MEWS Temp 0  MEWS Systolic 0  MEWS Pulse 2  MEWS RR 0  MEWS LOC 0  MEWS Score 2  MEWS Score Color Yellow  Assess: if the MEWS score is Yellow or Red  Were vital signs taken at a resting state? Yes  Focused Assessment No change from prior assessment  Does the patient meet 2 or more of the SIRS criteria? No  Does the patient have a confirmed or suspected source of infection? Yes  Provider and Rapid Response Notified? No  MEWS guidelines implemented  Yes, yellow  Treat  MEWS Interventions Considered administering scheduled or prn medications/treatments as ordered  Take Vital Signs  Increase Vital Sign Frequency  Yellow: Q2hr x1, continue Q4hrs until patient remains green for 12hrs  Escalate  MEWS: Escalate Yellow: Discuss with charge nurse and consider notifying provider and/or RRT  Notify: Charge Nurse/RN  Name of Charge Nurse/RN Notified Beverlee Nims, RN  Assess: SIRS CRITERIA  SIRS Temperature  0  SIRS Pulse 1  SIRS Respirations  0  SIRS WBC 0  SIRS Score Sum  1   LR bolus started and v/s were taken. Patient's HR 95-125 on dinamap, rounding team at the bedside, patient placed on tele and EKG completed. Scheduled metoprolol given.

## 2022-05-21 NOTE — Progress Notes (Signed)
NAME:  Larry [name], MRN:  [id number], DOB:  [birth year], LOS: 7 ADMISSION DATE:  05/14/2022  Subjective  Patient evaluated at bedside this AM. Reports appetite is still minimal, does not enjoy the hospital food. No acute changes in pain. No chest pain, palpitations.   Objective   Blood pressure 125/86, pulse (!) 105, temperature 98.1 F (36.7 C), temperature source Oral, resp. rate 18, height 5\' 11"  (1.803 m), weight (!) 150.7 kg, SpO2 98 %.     Intake/Output Summary (Last 24 hours) at 05/21/2022 0920 Last data filed at 05/20/2022 2030 Gross per 24 hour  Intake --  Output 1300 ml  Net -1300 ml   Filed Weights   05/14/22 1718 05/15/22 0636 05/19/22 2142  Weight: (!) 164 kg (!) 159.1 kg (!) 150.7 kg   Physical Exam: General: Resting comfortably in no acute distress CV: Regular rate, rhythm. No murmurs appreciated.  Pulm: Normal work of breathing on room air. Clear to auscultation bilaterally.  Abdomen: Soft, non-tender, non-distended. Ostomy bag with stool. MSK: Bilateral lower extremities wrapped.  Neuro: Awake, alert, conversing appropriately.  Grossly non-focal.  Labs       Latest Ref Rng & Units 05/21/2022    3:16 AM 05/20/2022    4:34 AM 05/19/2022    4:07 AM  CBC  WBC 4.0 - 10.5 K/uL 6.0  5.6  5.1   Hemoglobin 13.0 - 17.0 g/dL 10.8  10.7  10.9   Hematocrit 39.0 - 52.0 % 35.5  35.3  36.2   Platelets 150 - 400 K/uL 246  254  238       Latest Ref Rng & Units 05/21/2022    3:16 AM 05/20/2022    4:34 AM 05/19/2022    4:07 AM  BMP  Glucose 70 - 99 mg/dL 93  91  99   BUN 8 - 23 mg/dL 46  41  32   Creatinine 0.61 - 1.24 mg/dL 3.29  3.55  3.51   Sodium 135 - 145 mmol/L 135  136  136   Potassium 3.5 - 5.1 mmol/L 3.6  3.9  3.8   Chloride 98 - 111 mmol/L 104  103  105   CO2 22 - 32 mmol/L 19  19  19    Calcium 8.9 - 10.3 mg/dL 9.5  9.4  9.1     Summary   Larry [name] is 72yo person living with venous insufficiency, hypertension, hyperlipidemia, CAD, COPD  admitted 3/23 with Proteus bacteremia 2/2 lower extremity wounds, now pending SNF.   Assessment & Plan:  Principal Problem:   Localized swelling of both lower legs Active Problems:   Bacteremia due to Klebsiella pneumoniae   Cellulitis of right lower extremity   Cellulitis of left lower extremity   AKI (acute kidney injury) ([place])  #Proteus bacteremia #Bilateral lower extremity cellulitis #Chronic venous insufficiency Last day of antibiotics today, medically stable for discharge to SNF pending placement. - Last day of antibiotics, on Augmentin (day 7/7) - Appreciate TOC consult for SNF placement - Wound care, dressings per WOC - Continue pain and bowel regimen - PT/OT  #Acute on chronic renal failure Mild improvement of creatinine today after some fluids. Still not back to baseline, not having much appetite. Most likely this is pre-renal, we will give another liter of fluids and hold home medications.  - 1L fluid bolus today - Hold home losartan, furosemide, empagliflozin - Strict I/O - Daily RFP  #Sinus tachycardia Intermittently tachycardic, but asymptomatic. Not hypoxic. On antibiotics for bacteremia. ECG  today unrevealing. Likely partially from dehydration, although I do not think this is much of a change from his baseline.  - Continue home metoprolol tartrate   #Chronic diastolic heart failure #Hypertension Holding home furosemide, losartan in setting of AKI. BP has been well-controlled off of diltiazem.   Best practice:  DIET: HH IVF: n/a DVT PPX: lovenox BOWEL: miralax CODE: FULL FAM COM: n/a  Sanjuan Dame, MD Internal Medicine Resident PGY-3 PAGER: [phone] 05/21/2022 9:20 AM  If after hours (below), please contact on-call pager: [phone] 5PM-7AM Monday-Friday 1PM-7AM Saturday-Sunday

## 2022-05-21 NOTE — Progress Notes (Signed)
Inpatient Rehab Admissions Coordinator:   Per therapy recommendations, patient was screened for CIR candidacy by Clemens Catholic, MS, CCC-SLP . At this time, Pt. is not yet at a level to tolerate the intensity of CIR, as he is declining to attempt OOB and is max-total with bed mobility.   Pt. may have potential to progress to becoming a potential CIR candidate, so CIR admissions team will follow and monitor for progress and participation with therapies and place consult order if Pt. appears to be an appropriate candidate. Please contact me with any questions.   Clemens Catholic, [place], [place] Admissions Coordinator  [phone] ([place]) [phone] (office)

## 2022-05-22 ENCOUNTER — Encounter: Payer: Self-pay | Admitting: Surgery

## 2022-05-22 DIAGNOSIS — Z932 Ileostomy status: Secondary | ICD-10-CM | POA: Insufficient documentation

## 2022-05-22 DIAGNOSIS — L03116 Cellulitis of left lower limb: Secondary | ICD-10-CM | POA: Diagnosis not present

## 2022-05-22 DIAGNOSIS — L03115 Cellulitis of right lower limb: Secondary | ICD-10-CM | POA: Diagnosis not present

## 2022-05-22 DIAGNOSIS — N179 Acute kidney failure, unspecified: Secondary | ICD-10-CM

## 2022-05-22 DIAGNOSIS — R2243 Localized swelling, mass and lump, lower limb, bilateral: Secondary | ICD-10-CM | POA: Diagnosis not present

## 2022-05-22 DIAGNOSIS — N1832 Chronic kidney disease, stage 3b: Secondary | ICD-10-CM

## 2022-05-22 DIAGNOSIS — B964 Proteus (mirabilis) (morganii) as the cause of diseases classified elsewhere: Secondary | ICD-10-CM | POA: Diagnosis not present

## 2022-05-22 LAB — RENAL FUNCTION PANEL
Albumin: 2.6 g/dL — ABNORMAL LOW (ref 3.5–5.0)
Anion gap: 15 (ref 5–15)
BUN: 53 mg/dL — ABNORMAL HIGH (ref 8–23)
CO2: 15 mmol/L — ABNORMAL LOW (ref 22–32)
Calcium: 9.3 mg/dL (ref 8.9–10.3)
Chloride: 104 mmol/L (ref 98–111)
Creatinine, Ser: 3.5 mg/dL — ABNORMAL HIGH (ref 0.61–1.24)
GFR, Estimated: 18 mL/min — ABNORMAL LOW (ref >60)
Glucose, Bld: 99 mg/dL (ref 70–99)
Phosphorus: 5.7 mg/dL — ABNORMAL HIGH (ref 2.5–4.6)
Potassium: 4.4 mmol/L (ref 3.5–5.1)
Sodium: 134 mmol/L — ABNORMAL LOW (ref 135–145)

## 2022-05-22 LAB — MAGNESIUM: Magnesium: 2.3 mg/dL (ref 1.7–2.4)

## 2022-05-22 MED ORDER — LACTATED RINGERS IV SOLN: INTRAVENOUS | Status: DC

## 2022-05-22 MED ORDER — LACTATED RINGERS IV SOLN: INTRAVENOUS | Status: AC

## 2022-05-22 NOTE — Plan of Care (Signed)
  Problem: Clinical Measurements: Goal: Cardiovascular complication will be avoided Outcome: Progressing   Problem: Nutrition: Goal: Adequate nutrition will be maintained Outcome: Progressing   Problem: Coping: Goal: Level of anxiety will decrease Outcome: Progressing   Problem: Elimination: Goal: Will not experience complications related to bowel motility Outcome: Progressing Goal: Will not experience complications related to urinary retention Outcome: Progressing   Problem: Pain Managment: Goal: General experience of comfort will improve Outcome: Progressing   Problem: Safety: Goal: Ability to remain free from injury will improve Outcome: Progressing

## 2022-05-22 NOTE — Progress Notes (Signed)
HD#8 Subjective:   Summary: This is a 72 year old male with a past medical history of ischemic cardiomyopathy, chronic leg edema who presents with concerns of bilateral lower extremity swelling and pain.  Patient admitted for further evaluation and management of lower extremity cellulitis causing Proteus bacteremia.  Overnight Events: No overnight events  Patient resting bed upon my exam. Patient reports that he is doing well. He denies any shortness of breath, fevers, or chills, He notes that he is having some pain in his lower extremities. He notes a very decreased appetite.  He states he has not been eating or drinking well.  Objective:  Vital signs in last 24 hours: Vitals:   05/21/22 1307 05/21/22 1523 05/21/22 2057 05/22/22 0451  BP: 126/83 115/79 137/82 118/85  Pulse: (!) 110 88 99 90  Resp:  16 20 19   Temp:  98.6 F (37 C) 98.2 F (36.8 C) 98.6 F (37 C)  TempSrc:  Oral Oral Oral  SpO2: 97% 100% 96% 98%  Weight:      Height:       Supplemental O2: Room Air SpO2: 98 %   Physical Exam:  Constitutional: well-appearing, resting in bed in no acute distress HENT: normocephalic atraumatic Cardiovascular: regular rate and rhythm, no m/r/g Pulmonary/Chest: normal work of breathing on room air, lungs clear to auscultation bilaterally Abdominal: soft, non-tender, non-distended, colostomy with liquid output Extremities: Bilateral lower extremities with Ace bandages wrapped. Ointment on toes. No drainage thorough bandages appreciated. Bilateral lower extremities with increased strength noted on hip flexion.   Filed Weights   05/14/22 1718 05/15/22 0636 05/19/22 2142  Weight: (!) 164 kg (!) 159.1 kg (!) 150.7 kg     Intake/Output Summary (Last 24 hours) at 05/22/2022 0712 Last data filed at 05/22/2022 0451 Gross per 24 hour  Intake 480 ml  Output 551 ml  Net -71 ml   Net IO Since Admission: -1,596 mL [05/22/22 0712]  Pertinent Labs:    Latest Ref Rng & Units  05/21/2022    3:16 AM 05/20/2022    4:34 AM 05/19/2022    4:07 AM  CBC  WBC 4.0 - 10.5 K/uL 6.0  5.6  5.1   Hemoglobin 13.0 - 17.0 g/dL 10.8  10.7  10.9   Hematocrit 39.0 - 52.0 % 35.5  35.3  36.2   Platelets 150 - 400 K/uL 246  254  238        Latest Ref Rng & Units 05/22/2022   12:27 AM 05/21/2022    3:16 AM 05/20/2022    4:34 AM  CMP  Glucose 70 - 99 mg/dL 99  93  91   BUN 8 - 23 mg/dL 53  46  41   Creatinine 0.61 - 1.24 mg/dL 3.50  3.29  3.55   Sodium 135 - 145 mmol/L 134  135  136   Potassium 3.5 - 5.1 mmol/L 4.4  3.6  3.9   Chloride 98 - 111 mmol/L 104  104  103   CO2 22 - 32 mmol/L 15  19  19    Calcium 8.9 - 10.3 mg/dL 9.3  9.5  9.4     Imaging: No results found.  Assessment/Plan:   Principal Problem:   Localized swelling of both lower legs Active Problems:   Bacteremia due to Klebsiella pneumoniae   Cellulitis of right lower extremity   Cellulitis of left lower extremity   AKI (acute kidney injury) ([place])   Patient Summary: Larry [name] is a 72 y.o.  with a pertinent PMH of ischemic cardiomyopathy, chronic leg edema who presents with concerns of bilateral lower extremity swelling and pain.  Patient admitted for further evaluation and management of lower extremity cellulitis causing Proteus bacteremia.  #Proteus bacteremia #Bilateral lower extremity cellulitis #Venous insuffiencey  #Venous stasis dermatitis Patient has successfully completed 7-day antibiotic course.  Patient remains afebrile.  No concerns of elevated white count.  Patient remains hemodynamically stable.  Infection is likely cleared.  Continue to monitor.  May consider now is to place patient in SNF as he has become deconditioned, and pain has restricted him from moving. -Continue with pain control with Tylenol, gabapentin, oxycodone -Encourage patient to work with PT/OT -Monitor fever curve -Monitor white count  #AKI on CKD stage IIIb #AGMA Patient creatinine bumped up from 3.29-3.50  overnight.  BUN continues to increase.  Patient has now received 2 L of bolus over the past 48 hours.  Given significantly decreased p.o. intake, I do feel this is likely prerenal.  Patient is net negative during hospitalization.  AGMA likely related to BUN increase.  At baseline, patient does have renal insufficiency, likely contributing to metabolic acidosis as well. -Start 24-hour lactated Ringer's infusion -Encourage p.o. intake -Monitor BMP -Patient can benefit from sodium bicarb in the outpatient setting -Monitor urine output  #HFmrEF #Ischemic cardiomyopathy #Hypertension Will need to continue to hold most of GDMT given AKI on CKD.  Continue to hold losartan 25 mg daily, diltiazem 60 mg every 8 hours, Lasix 60 mg daily, and Jardiance 10 mg daily.  Patient remains hemodynamically stable at this moment.  Heart rate within normal limits. -Continue home metoprolol tartrate 37.5 mg twice daily -Continue aspirin 81 mg daily -Continue Lipitor 40 mg daily -Continue to hold losartan, diltiazem, Lasix, Jardiance -Continue to monitor volume status and blood pressure  #Iron deficiency anemia -Continue iron supplementation  Diet: Heart Healthy IVF: LR,Bolus VTE: Enoxaparin Code: Full PT/OT recs: SNF for Subacute PT  Dispo: Anticipated discharge to Skilled nursing facility in 3 days pending SNF approval.  He has chosen [facility] as his top 2.   [place] Internal Medicine Resident PGY-1 [phone] Please contact the on call pager after 5 pm and on weekends at [phone].

## 2022-05-23 ENCOUNTER — Telehealth: Payer: Self-pay | Admitting: Internal Medicine

## 2022-05-23 DIAGNOSIS — L03115 Cellulitis of right lower limb: Secondary | ICD-10-CM | POA: Diagnosis not present

## 2022-05-23 DIAGNOSIS — L03116 Cellulitis of left lower limb: Secondary | ICD-10-CM | POA: Diagnosis not present

## 2022-05-23 DIAGNOSIS — R2242 Localized swelling, mass and lump, left lower limb: Secondary | ICD-10-CM

## 2022-05-23 DIAGNOSIS — R2241 Localized swelling, mass and lump, right lower limb: Secondary | ICD-10-CM | POA: Diagnosis not present

## 2022-05-23 LAB — BASIC METABOLIC PANEL
Anion gap: 11 (ref 5–15)
BUN: 57 mg/dL — ABNORMAL HIGH (ref 8–23)
CO2: 17 mmol/L — ABNORMAL LOW (ref 22–32)
Calcium: 9 mg/dL (ref 8.9–10.3)
Chloride: 105 mmol/L (ref 98–111)
Creatinine, Ser: 3.33 mg/dL — ABNORMAL HIGH (ref 0.61–1.24)
GFR, Estimated: 19 mL/min — ABNORMAL LOW (ref >60)
Glucose, Bld: 105 mg/dL — ABNORMAL HIGH (ref 70–99)
Potassium: 3.9 mmol/L (ref 3.5–5.1)
Sodium: 133 mmol/L — ABNORMAL LOW (ref 135–145)

## 2022-05-23 MED ORDER — MIRTAZAPINE 15 MG PO TABS
7.5000 mg | ORAL_TABLET | Freq: Every day | ORAL | Status: DC
  Administered 2022-05-23: 7.5 mg via ORAL
  Filled 2022-05-23: qty 1

## 2022-05-23 MED ORDER — LACTATED RINGERS IV SOLN: INTRAVENOUS | Status: AC

## 2022-05-23 MED ORDER — ENSURE ENLIVE PO LIQD
237.0000 mL | Freq: Two times a day (BID) | ORAL | Status: DC
  Administered 2022-05-23 – 2022-05-24 (×4): 237 mL via ORAL

## 2022-05-23 NOTE — Progress Notes (Signed)
Physical Therapy Treatment Patient Details Name: Larry [name] MRN: [id number] DOB: 05/10/1950 Today's Date: 05/23/2022   History of Present Illness Patient is a 72 yo male presenting to the ED with BLE swelling, increased weakness and pain with an inability to walk on 05/14/22. Admitted same day with cellulitis; R dorsal foot wound, bil ankle indentations at sock cuffs;  PMH includes: HTN, HLD, CAD, COPD, obesity, NSVT, CKD, CHF, colon cancer s/p resection and colostomy placement, and sleep apnea    PT Comments    Pt progressing slowly towards physical therapy goals. Increased pain reported with attempts at sitting EOB. Pt unable to maintain sitting EOB due to pain in R groin. Assessed area where pt indicated pain. No pain reported with palpation. Attempted again with the same pain. RN notified. This pain limiting progression of mobility and tolerance for functional activity. Updated recommendations to post-acute rehab <3 hours/day as I do not anticipate pt will be able to tolerate >3 hours of therapy a day by the time he is medically ready to d/c. Will continue to follow and progress as able per POC.    Recommendations for follow up therapy are one component of a multi-disciplinary discharge planning process, led by the attending physician.  Recommendations may be updated based on patient status, additional functional criteria and insurance authorization.  Follow Up Recommendations  Can patient physically be transported by private vehicle: No    Assistance Recommended at Discharge Frequent or constant Supervision/Assistance  Patient can return home with the following Two people to help with walking and/or transfers;Two people to help with bathing/dressing/bathroom;Assist for transportation;Help with stairs or ramp for entrance   Equipment Recommendations  Rolling walker (2 wheels);BSC/3in1;Wheelchair (measurements PT);Wheelchair cushion (measurements PT) ([facility] and [facility]; consider 22-24  inch wide WC/cushion -- can consider hospital bed; will continue to discern based on progress with therapies)    Recommendations for Other Services       Precautions / Restrictions Precautions Precautions: Fall Precaution Comments: R groin pain with hip flexion Restrictions Weight Bearing Restrictions: No     Mobility  Bed Mobility Overal bed mobility: Needs Assistance Bed Mobility: Supine to Sit, Rolling, Sit to Supine     Supine to sit: Max assist, +2 for physical assistance, HOB elevated Sit to supine: Total assist, Max assist, +2 for physical assistance   General bed mobility comments: max A to come to partial sitting on EOB, pt able to initiate BLEs to EOB with cueing for sequencing and reach across body to pull on bedrail, max A to elevate trunk to sitting with pt unable to tolerate full upright sitting due to abdomnial pain with increased hip flexion in sitting, returned to suping with total A to elevate BLEs    Transfers                   General transfer comment: Unable to tolerate progressing mobility due to R groin pain    Ambulation/Gait                   Stairs             Wheelchair Mobility    Modified Rankin (Stroke Patients Only)       Balance Overall balance assessment: Needs assistance Sitting-balance support: Bilateral upper extremity supported, Feet supported Sitting balance-Leahy Scale: Zero Sitting balance - Comments: Max assist and support at back to maintain sitting; unable to tolerate full upright sitting this date due to groin pain Postural control: Posterior lean (  Heavy)                                  Cognition Arousal/Alertness: Awake/alert Behavior During Therapy: WFL for tasks assessed/performed Overall Cognitive Status: Within Functional Limits for tasks assessed                                          Exercises General Exercises - Lower Extremity Ankle Circles/Pumps: 10  reps, Supine Heel Slides: 10 reps, Supine    General Comments        Pertinent Vitals/Pain Pain Assessment Pain Assessment: Faces Faces Pain Scale: Hurts whole lot Pain Location: R groin with hip flexion Pain Descriptors / Indicators: Discomfort, Grimacing, Guarding, Sharp Pain Intervention(s): Limited activity within patient's tolerance, Monitored during session, Repositioned    Home Living                          Prior Function            PT Goals (current goals can now be found in the care plan section) Acute Rehab PT Goals Patient Stated Goal: agrees that he must be able to do more to be able to get home PT Goal Formulation: With patient Time For Goal Achievement: 06/06/22 Potential to Achieve Goals: Good Progress towards PT goals: Progressing toward goals    Frequency    Min 3X/week      PT Plan Discharge plan needs to be updated    Co-evaluation              AM-PAC PT "6 Clicks" Mobility   Outcome Measure  Help needed turning from your back to your side while in a flat bed without using bedrails?: A Lot Help needed moving from lying on your back to sitting on the side of a flat bed without using bedrails?: Total Help needed moving to and from a bed to a chair (including a wheelchair)?: Total Help needed standing up from a chair using your arms (e.g., wheelchair or bedside chair)?: Total Help needed to walk in hospital room?: Total Help needed climbing 3-5 steps with a railing? : Total 6 Click Score: 7    End of Session Equipment Utilized During Treatment: Other (comment) (bed pads under BLEs) Activity Tolerance: Patient limited by pain Patient left: in bed;with call bell/phone within reach;with bed alarm set Nurse Communication: Mobility status;Other (comment) (R groin pain limiting activity) PT Visit Diagnosis: Unsteadiness on feet (R26.81);Other abnormalities of gait and mobility (R26.89);Muscle weakness (generalized) (M62.81);History  of falling (Z91.81);Pain;Other (comment) (decr skin integrity) Pain - Right/Left: Right Pain - part of body: Leg;Ankle and joints of foot (also soreness L LE)     Time: [id number] PT Time Calculation (min) (ACUTE ONLY): 23 min  Charges:  $Therapeutic Activity: 23-37 mins                     Rolinda Roan, PT, DPT Acute Rehabilitation Services Secure Chat Preferred Office: [phone]    [name] 05/23/2022, 3:13 PM

## 2022-05-23 NOTE — Progress Notes (Addendum)
HD#9 Subjective:   Summary: This is a 72 year old male with a past medical history of ischemic cardiomyopathy, chronic leg edema who presents with concerns of bilateral lower extremity swelling and pain.  Patient admitted for further evaluation and management of lower extremity cellulitis causing Proteus bacteremia.  Overnight Events: No overnight events  Patient resting in bed upon my exam. Patient reports that he is still having no appetite.  He states that when he looks at the hospital makes him nauseous.  Wife is at bedside, he states that he likes the unhealthy foods.  Patient reports that he has been feeling down recently.  But he does not feel like he is depressed.  He does report having loss of interest in activity issues.  No trouble swallowing, no pain swallowing.  Denies any shortness of breath or fevers.  He states since being here, he has not been up at all.  He states he has not transferred to the chair at all.  He states he does not feel like getting up and moving, as he feels very weak.  Patient does report that he does not like to do much.  He reports that he sometimes get full faster and that stops him from eating.  He denies any abdominal pain, nausea, or vomiting.  Objective:  Vital signs in last 24 hours: Vitals:   05/22/22 1505 05/22/22 2216 05/23/22 0441 05/23/22 0855  BP: 118/65 134/75 113/71 117/63  Pulse: (!) 40 91 87 81  Resp: 18 16 20    Temp: 98.4 F (36.9 C) 98.4 F (36.9 C) 98.4 F (36.9 C) 98.5 F (36.9 C)  TempSrc: Oral Oral Oral Oral  SpO2: 96% 96% 95% 95%  Weight:      Height:       Supplemental O2: Room Air SpO2: 95 %   Physical Exam:  Constitutional: well-appearing, resting in bed in no acute distress HENT: normocephalic atraumatic Cardiovascular: regular rate and rhythm, no m/r/g Pulmonary/Chest: normal work of breathing on room air, lungs clear to auscultation bilaterally Abdominal: Colostomy bag with liquid output very dark in  nature Groin: Left groin and right groin with no signs of rash Extremities: Bilateral lower extremities with Ace bandages wrapped.  Toes with good movement.  Increasing strength noted on hip flexion.  Sensation intact.  No obvious drainage through bandages.  Filed Weights   05/14/22 1718 05/15/22 0636 05/19/22 2142  Weight: (!) 164 kg (!) 159.1 kg (!) 150.7 kg     Intake/Output Summary (Last 24 hours) at 05/23/2022 1449 Last data filed at 05/23/2022 1412 Gross per 24 hour  Intake 2092.61 ml  Output 1175 ml  Net 917.61 ml   Net IO Since Admission: -248.96 mL [05/23/22 1449]  Pertinent Labs:    Latest Ref Rng & Units 05/21/2022    3:16 AM 05/20/2022    4:34 AM 05/19/2022    4:07 AM  CBC  WBC 4.0 - 10.5 K/uL 6.0  5.6  5.1   Hemoglobin 13.0 - 17.0 g/dL 10.8  10.7  10.9   Hematocrit 39.0 - 52.0 % 35.5  35.3  36.2   Platelets 150 - 400 K/uL 246  254  238        Latest Ref Rng & Units 05/23/2022    7:54 AM 05/22/2022   12:27 AM 05/21/2022    3:16 AM  CMP  Glucose 70 - 99 mg/dL 105  99  93   BUN 8 - 23 mg/dL 57  53  46  Creatinine 0.61 - 1.24 mg/dL 3.33  3.50  3.29   Sodium 135 - 145 mmol/L 133  134  135   Potassium 3.5 - 5.1 mmol/L 3.9  4.4  3.6   Chloride 98 - 111 mmol/L 105  104  104   CO2 22 - 32 mmol/L 17  15  19    Calcium 8.9 - 10.3 mg/dL 9.0  9.3  9.5     Imaging: No results found.  Assessment/Plan:   Principal Problem:   Localized swelling of both lower legs Active Problems:   Bacteremia due to Klebsiella pneumoniae   Cellulitis of right lower extremity   Cellulitis of left lower extremity   AKI (acute kidney injury)   Patient Summary: Larry [name] is a 72 y.o. with a pertinent PMH of ischemic cardiomyopathy, chronic leg edema who presents with concerns of bilateral lower extremity swelling and pain.  Patient admitted for further evaluation and management of lower extremity cellulitis causing Proteus bacteremia.  #Proteus bacteremia #Bilateral lower  extremity cellulitis #Venous insuffiencey  #Venous stasis dermatitis Patient successfully completed course of antibiotics.  Patient remains afebrile.  No white count.  On exam, lower extremities with Ace bandages wrapped.  Lower extremities healing well.  Infection is likely cleared.  Patient continues to work with PT and OT to increase his strength.  Patient will be going to a SNF when insurance is approved.   -Continue with pain control with Tylenol, gabapentin, oxycodone -Encourage patient to work with PT/OT -Monitor fever curve -Monitor white count  #AKI on CKD stage IIIb, improving #NAGMA #Poor p.o. intake Creatinine at 3.33 today.  Patient did respond well to fluids.  Likely patient is very volume down.  Did evaluate for patient's worsening appetite loss.  This could be due to acute infection.  Did ask about possible depression which could be contributing.  Patient has lost interest in activities.  This all could be contributing.  Did encourage patient to have increased p.o. intake.  During hospitalization, patient also had CT abdomen pelvis, which did not show any overt malignancy.  Did not show any overt abscess or any intra-abdominal findings that could be contributing to symptoms.  Encourage patient to have increased p.o. intake.  Did want to give more fluids later today as patient did respond to fluids yesterday. -1 L over 10 hours today LR -Encourage p.o. intake -Ensure -Start mirtazapine 7.5 mg nightly -BMP pending -Can benefit from sodium bicarb in outpatient setting  #HFmrEF #Ischemic cardiomyopathy #Hypertension Will need to continue to hold most of GDMT given AKI on CKD.  Continue to hold losartan 25 mg daily, diltiazem 60 mg every 8 hours, Lasix 60 mg daily, and Jardiance 10 mg daily.  Patient remains hemodynamically stable at this moment.  Heart rate within normal limits. -Continue home metoprolol tartrate 37.5 mg twice daily -Continue aspirin 81 mg daily -Continue  Lipitor 40 mg daily -Continue to hold losartan, diltiazem, Lasix, Jardiance -Continue to monitor volume status and blood pressure  #Iron deficiency anemia -Continue iron supplementation  Diet: Heart Healthy IVF: LR,Bolus VTE: Enoxaparin Code: Full PT/OT recs: SNF for Subacute PT  Dispo: Anticipated discharge to Skilled nursing facility in 2 days pending SNF approval.   [place] Internal Medicine Resident PGY-1 [phone] Please contact the on call pager after 5 pm and on weekends at [phone].

## 2022-05-23 NOTE — Telephone Encounter (Signed)
Paper Work Dropped Off: Disabled Parking Tag Renewal   Date: 05/23/22   Location of paper: In Doctor's box

## 2022-05-23 NOTE — TOC Progression Note (Signed)
Transition of Care [facility]) - Initial/Assessment Note    Patient Details  Name: Larry [name] MRN: [id number] Date of Birth: [birth year]  Transition of Care [facility]) CM/SW Contact:    Milinda Antis, [place] Phone Number: 05/23/2022, 10:52 AM  Clinical Narrative:                 LCSW contacted the patient's spouse who was in the room with the patient to inform the family that [facility] extended a bed offer.  The family is in agreement with patient transitioning to this facility when medically ready.  TOC following.  Expected Discharge Plan: Skilled Nursing Facility Barriers to Discharge: Continued Medical Work up   Patient Goals and CMS Choice   CMS Medicare.gov Compare Post Acute Care list provided to:: Patient Choice offered to / list presented to : Patient      Expected Discharge Plan and Services In-house Referral: Clinical Social Work     Living arrangements for the past 2 months: Single Family Home                                      Prior Living Arrangements/Services Living arrangements for the past 2 months: Single Family Home Lives with:: Spouse Patient language and need for interpreter reviewed:: Yes Do you feel safe going back to the place where you live?: Yes      Need for Family Participation in Patient Care: Yes (Comment) Care giver support system in place?: Yes (comment)   Criminal Activity/Legal Involvement Pertinent to Current Situation/Hospitalization: No - Comment as needed  Activities of Daily Living Home Assistive Devices/Equipment: Ostomy supplies, Walker (specify type), Eyeglasses ADL Screening (condition at time of admission) Patient's cognitive ability adequate to safely complete daily activities?: Yes Is the patient deaf or have difficulty hearing?: No Does the patient have difficulty seeing, even when wearing glasses/contacts?: No Does the patient have difficulty concentrating, remembering, or making decisions?:  No Patient able to express need for assistance with ADLs?: Yes Does the patient have difficulty dressing or bathing?: Yes Independently performs ADLs?: No Communication: Independent Dressing (OT): Needs assistance Is this a change from baseline?: Change from baseline, expected to last >3 days Grooming: Needs assistance Is this a change from baseline?: Change from baseline, expected to last >3 days Feeding: Independent Bathing: Needs assistance Is this a change from baseline?: Change from baseline, expected to last >3 days Toileting: Needs assistance Is this a change from baseline?: Change from baseline, expected to last >3days In/Out Bed: Needs assistance Is this a change from baseline?: Change from baseline, expected to last >3 days Walks in Home: Needs assistance Is this a change from baseline?: Change from baseline, expected to last >3 days Does the patient have difficulty walking or climbing stairs?: Yes Weakness of Legs: Both Weakness of Arms/Hands: None  Permission Sought/Granted   Permission granted to share information with : Yes, Verbal Permission Granted              Emotional Assessment Appearance:: Appears stated age Attitude/Demeanor/Rapport: Engaged Affect (typically observed): Stoic Orientation: : Oriented to Situation, Oriented to  Time, Oriented to Place, Oriented to Self Alcohol / Substance Use: Not Applicable Psych Involvement: No (comment)  Admission diagnosis:  Hypokalemia [E87.6] Cellulitis of left lower extremity [L03.116] Cellulitis of right lower extremity [L03.115] Localized swelling of both lower legs [R22.43] Stage 3b chronic kidney disease [N18.32] Patient Active Problem List  Diagnosis Date Noted   Ileostomy in place 05/22/2022   AKI (acute kidney injury) 05/19/2022   Bacteremia due to Klebsiella pneumoniae 05/16/2022   Cellulitis of right lower extremity 05/16/2022   Cellulitis of left lower extremity 05/16/2022   Localized swelling of  both lower legs 05/14/2022   Tonsillitis 10/28/2018   Pharyngitis 10/27/2018   Poor dentition 10/27/2018   Sepsis due to urinary tract infection 06/30/2017   Acute kidney injury superimposed on chronic kidney disease III ([place]) 06/30/2017   Coronary artery disease 06/30/2017   Hypertension 06/30/2017   COPD mixed type 06/30/2017   Venous stasis 06/30/2017   Ectopic atrial tachycardia 06/30/2017   Hydronephrosis of left kidney with hydroureter 06/30/2017   Sepsis secondary to UTI 06/30/2017   Genetic testing 08/08/2016   Acute pulmonary edema    Acute respiratory failure with hypoxia    SBO (small bowel obstruction)    Encounter for nasogastric tube placement    Abdominal pain    Hypervolemia    Pressure injury of skin 04/25/2016   Acute respiratory failure    Adynamic ileus    Peritonitis    Pulmonary infiltrate present on computed tomography 11/15/2015   Cough 11/14/2015   Lung nodule    Atrial tachycardia    Chronic diastolic CHF (congestive heart failure)    Palpitations    Dyspnea and respiratory abnormalities    Family history of ovarian cancer    CKD (chronic kidney disease), stage III ([place]) 06/12/2014   Thrombocytopenia    Ischemic chest pain 06/11/2014   Atherosclerosis of artery of extremity with ulceration 06/06/2014   Venous stasis ulcer of left lower extremity 06/06/2014   Acute respiratory distress 09/30/2013   Respiratory distress 09/30/2013   Peripheral arterial disease ([place]) 08/21/2013   Postop check 03/26/2013   Physical deconditioning 03/01/2013   Torsades de pointes ([place]) 02/27/2013   Acute on chronic diastolic heart failure [id number]   Colon cancer 02/09/2013   Atrial fibrillation 02/05/2013   Acute renal failure 02/02/2013   Hypotension 02/02/2013   Chronic blood loss anemia 02/02/2013   Edema 06/16/2011   Tachycardia 06/16/2011   IMPOTENCE OF ORGANIC ORIGIN 01/12/2009   Obstructive sleep apnea 12/04/2008   HYPERLIPIDEMIA 12/03/2008   Morbid  obesity due to excess calories 12/03/2008   Coronary atherosclerosis 12/03/2008   CARDIOMYOPATHY, ISCHEMIC 12/03/2008   Atrial tachycardia, paroxysmal 12/03/2008   ASTHMA 12/03/2008   Essential hypertension 12/02/2008   PCP:  Lin Landsman, MD Pharmacy:   [id number] - [place], [place] [address] [place] [address] Phone: [phone] Fax: [phone]     Social Determinants of Health (SDOH) Social History: SDOH Screenings   Food Insecurity: No Food Insecurity (05/15/2022)  Housing: Low Risk  (05/15/2022)  Transportation Needs: No Transportation Needs (05/15/2022)  Utilities: Not At Risk (05/15/2022)  Tobacco Use: Medium Risk (05/15/2022)   SDOH Interventions:     Readmission Risk Interventions     No data to display

## 2022-05-24 DIAGNOSIS — R2241 Localized swelling, mass and lump, right lower limb: Secondary | ICD-10-CM | POA: Diagnosis not present

## 2022-05-24 DIAGNOSIS — L03116 Cellulitis of left lower limb: Secondary | ICD-10-CM | POA: Diagnosis not present

## 2022-05-24 DIAGNOSIS — L03115 Cellulitis of right lower limb: Secondary | ICD-10-CM | POA: Diagnosis not present

## 2022-05-24 DIAGNOSIS — R2242 Localized swelling, mass and lump, left lower limb: Secondary | ICD-10-CM | POA: Diagnosis not present

## 2022-05-24 LAB — BASIC METABOLIC PANEL
Anion gap: 12 (ref 5–15)
BUN: 56 mg/dL — ABNORMAL HIGH (ref 8–23)
CO2: 16 mmol/L — ABNORMAL LOW (ref 22–32)
Calcium: 9.3 mg/dL (ref 8.9–10.3)
Chloride: 109 mmol/L (ref 98–111)
Creatinine, Ser: 2.74 mg/dL — ABNORMAL HIGH (ref 0.61–1.24)
GFR, Estimated: 24 mL/min — ABNORMAL LOW (ref >60)
Glucose, Bld: 95 mg/dL (ref 70–99)
Potassium: 4 mmol/L (ref 3.5–5.1)
Sodium: 137 mmol/L (ref 135–145)

## 2022-05-24 MED ORDER — ENSURE ENLIVE PO LIQD: 237.0000 mL | Freq: Two times a day (BID) | ORAL | 12 refills | Status: DC

## 2022-05-24 MED ORDER — WHITE PETROLATUM EX OINT: 1.0000 | TOPICAL_OINTMENT | Freq: Every day | CUTANEOUS | 0 refills | Status: DC

## 2022-05-24 MED ORDER — ACETAMINOPHEN 500 MG PO TABS: 1000.0000 mg | ORAL_TABLET | Freq: Three times a day (TID) | ORAL | 0 refills | Status: DC

## 2022-05-24 MED ORDER — GABAPENTIN 100 MG PO CAPS: 200.0000 mg | ORAL_CAPSULE | Freq: Two times a day (BID) | ORAL | 0 refills | Status: DC

## 2022-05-24 MED ORDER — MIRTAZAPINE 7.5 MG PO TABS: 7.5000 mg | ORAL_TABLET | Freq: Every day | ORAL | 0 refills | Status: DC

## 2022-05-24 MED ORDER — OXYCODONE HCL 10 MG PO TABS: 10.0000 mg | ORAL_TABLET | Freq: Four times a day (QID) | ORAL | 0 refills | Status: AC | PRN

## 2022-05-24 NOTE — TOC Transition Note (Signed)
Transition of Care [facility]) - CM/SW Discharge Note   Patient Details  Name: Larry [name] MRN: [id number] Date of Birth: 28-Mar-1950  Transition of Care [facility]) CM/SW Contact:  Milinda Antis, [place] Phone Number: 05/24/2022, 12:07 PM   Clinical Narrative:    Patient will DC to: [place] date: 05/24/2022 Family notified: Spouse, [name] Transport by: Corey Harold   Per MD patient ready for DC to [facility]. RN to call report prior to discharge [phone] room 104p). RN, patient's family, and facility notified of DC. Discharge Summary sent to facility. DC packet on chart. Ambulance transport requested for patient.   CSW will sign off for now as social work intervention is no longer needed. Please consult [place] again if new needs arise.    Final next level of care: Skilled Nursing Facility Barriers to Discharge: Barriers Resolved   Patient Goals and CMS Choice CMS Medicare.gov Compare Post Acute Care list provided to:: Patient Choice offered to / list presented to : Patient  Discharge Placement                Patient chooses bed at:  [facility]) Patient to be transferred to facility by: [place] Name of family member notified: Lea, Asa (Spouse) [phone] Patient and family notified of of transfer: 05/24/22  Discharge Plan and Services Additional resources added to the After Visit Summary for   In-house Referral: Clinical Social Work                                   Social Determinants of Health ([place]) Interventions SDOH Screenings   Food Insecurity: No Food Insecurity (05/15/2022)  Housing: Low Risk  (05/15/2022)  Transportation Needs: No Transportation Needs (05/15/2022)  Utilities: Not At Risk (05/15/2022)  Tobacco Use: Medium Risk (05/15/2022)     Readmission Risk Interventions     No data to display

## 2022-05-24 NOTE — Plan of Care (Signed)

## 2022-05-24 NOTE — Discharge Summary (Signed)
Name: Larry [name] MRN: [id number] DOB: 04/29/1950 72 y.o. PCP: Larry Landsman, MD  Date of Admission: 05/14/2022  5:09 PM Date of Discharge: 05/24/22 Attending Physician: Dr. Lottie [name]  Discharge Diagnosis: Principal Problem:   Localized swelling of both lower legs Active Problems:   Bacteremia due to Klebsiella pneumoniae   Cellulitis of right lower extremity   Cellulitis of left lower extremity   AKI (acute kidney injury)    Discharge Medications: Allergies as of 05/24/2022   No Known Allergies      Medication List     STOP taking these medications    diltiazem 60 MG tablet Commonly known as: CARDIZEM   empagliflozin 10 MG Tabs tablet Commonly known as: Jardiance   furosemide 40 MG tablet Commonly known as: LASIX   spironolactone 25 MG tablet Commonly known as: ALDACTONE       TAKE these medications    acetaminophen 500 MG tablet Commonly known as: TYLENOL Take 2 tablets (1,000 mg total) by mouth every 8 (eight) hours.   aspirin EC 81 MG tablet Take 1 tablet (81 mg total) by mouth daily.   atorvastatin 40 MG tablet Commonly known as: LIPITOR TAKE 1 TABLET BY MOUTH EVERY DAY   feeding supplement Liqd Take 237 mLs by mouth 2 (two) times daily between meals.   ferrous sulfate 325 (65 FE) MG tablet Take 1 tablet (325 mg total) by mouth daily with breakfast.   gabapentin 100 MG capsule Commonly known as: NEURONTIN Take 2 capsules (200 mg total) by mouth 2 (two) times daily.   levalbuterol 45 MCG/ACT inhaler Commonly known as: XOPENEX HFA TAKE 2 PUFFS BY MOUTH EVERY 4 HOURS AS NEEDED FOR WHEEZE What changed:  how much to take how to take this when to take this   metoprolol tartrate 25 MG tablet Commonly known as: LOPRESSOR TAKE 1.5 TABLETS (37.5 MG TOTAL) BY MOUTH 2 (TWO) TIMES DAILY.   mirtazapine 7.5 MG tablet Commonly known as: REMERON Take 1 tablet (7.5 mg total) by mouth at bedtime.   Multi-Vitamin Daily Tabs Take 1 tablet  by mouth daily.   nitroGLYCERIN 0.4 MG SL tablet Commonly known as: NITROSTAT Place 0.4 mg under the tongue every 5 (five) minutes as needed for chest pain.   Oxycodone HCl 10 MG Tabs Take 1 tablet (10 mg total) by mouth every 6 (six) hours as needed for up to 3 days for severe pain or moderate pain.   simethicone 125 MG chewable tablet Commonly known as: MYLICON Chew 0000000 mg by mouth 3 (three) times daily.   tamsulosin 0.4 MG Caps capsule Commonly known as: FLOMAX TAKE 1 CAPSULE BY MOUTH EVERY DAY   white petrolatum Oint Commonly known as: VASELINE Apply 1 Application topically daily. Start taking on: May 25, 2022        Disposition and follow-up:   Larry [name] was discharged from [facility] in Stable condition.  At the hospital follow up visit please address:  1.  Follow-up:  A) Proteus Bacteriemia/Lower extremity cellulitis: Patient was started on ceftriaxone and transitioned to Augmentin during his hospital stay. Ensure patient remain afebrile in the outpatient setting and ensure that he does not have any signs of infection.  B) Venous Insuffiencey: Patient has chronic stasis dermatitis. He was followed with podiatry in the outpatient setting and was lost to follow up. Patient's wife state that she can get him back into the [facility] on [address]. Ensure patient has follow up with foot care.  Continue to follow to see if patient has worsening ulcers.   C) AKI on CKD IIIb: Patient had AKI during his hospital stay likely in the setting of decreased P.O intake and dehydration.  To help with p.o. intake, did start mirtazapine.  Appetite increased. Patient was volume resuscitated during his hospital stay. Patient's creatinine on discharge was 2.7.  D) HFmrEF/HTN: Patient had hypotension and AKI and mostly all antihypertensives and GDMT. Only medications patient discharged on was metoprolol tartrate. Continue to resume medications in outpatient  setting as kidney function and blood pressure tolerates. Home meds include Jardiance 10 mg daily, losartan 25 mg daily, Lasix 60 mg daily, diltiazem 60 mg every 8 hours   E) Mild depression: During admission, patient did state he has lost interest in activities, and it has affected his appetite as well.  Patient was started on mirtazapine during admission.  Follow-up patient's mood outpatient.   2.  Labs / imaging needed at time of follow-up: CBC, CMP  3.  Pending labs/ test needing follow-up: N/A  4.  Medication Changes  Discharge patient on mirtazapine.  Discontinue Jardiance, losartan, Lasix, diltiazem on discharge.  Patient continued on other medications.  Follow-up Appointments:  Follow-up Information     Larry Landsman, MD. Schedule an appointment as soon as possible for a visit in 1 day(s).   Specialty: Family Medicine Why: For hospital follow-up Contact information: [place] [address] [address] Hospital Course by problem list: Larry [name] is a 72 y.o. with a pertinent PMH of venous insufficiency, hypertension, hyperlipidemia, CAD, COPD who presents with concerns of right lower extremity pain and swelling.  Concern for sepsis, patient had blood cultures which resulted positive for Proteus.  Patient mated for further evaluation and management of Proteus bacteremia.   #Proteus bacteremia #Bilateral Lower extremity cellulitis  #Venous insufficiency #Venous Statis dermatitis  Patient initially presented to the emergency department with concerns of lower extremity pain and swelling. Patient was admitted with concerns of cellulitis vs venous stasis dermatitis. Given concerns for systemic infection, blood cultures were taken.  Blood cultures were positive for Proteus bacteremia.  Given concern for source of infection, CT abdomen pelvis was obtained as well as MRI of bilateral feet.  CT abdomen pelvis was unrevealing. Bilateral lower  extremity MRI showing potential osteo of left second phalanx.  On clinical examination, there was no ulcer noted to left second phalanx, and osteomyelitis was less likely.  Patient completed course of antibiotics with ceftriaxone initially with transition to Augmentin.  Patient worked with PT/OT during hospitalization who recommended SNF for the patient to get stronger.  Patient had pain control with Tylenol, oxycodone, and gabapentin.  Wound care followed during hospitalization.  Patient to follow-up with podiatry outpatient to continue foot care.   #HFmrEF #NYHA Class III #Ischemic cardiomyopathy #Hypertension Patient has a past medical history of heart failure with mildly reduced ejection fraction.  Most recent EF 40 to 45%. During hospitalization there was no concern for acute exacerbation.  Patient had softer blood pressures during hospitalization requiring fluids as well as holding home antihypertensives.  During hospitalization, patient also had AKI on CKD, requiring pause on GDMT.  Patient discharged on metoprolol tartrate 37.5 mg twice daily.  Patient's home Jardiance 10 mg daily, losartan 25 mg daily, Lasix 60 mg daily, diltiazem 60 mg every 8 hours oral held at discharge.  Consider restarting as tolerated in the  outpatient setting.   #Hypertension Patient has softer blood pressures during hospitalization.  Most of home meds were held.  Patient resumed metoprolol tartrate 37.5 mg twice daily during hospitalization.  Patient held losartan 25 mg daily, Lasix 60 mg daily, diltiazem 60 mg every 8 hours.   #AKI on CKD stage IIIb During hospitalization, patient's creatinine bumped up to 3.55.  Lasix was held.  Given poor p.o. intake, this was likely prerenal.  Patient had fluid resuscitation during hospitalization.  Creatinine 2.74 on discharge.  GDMT held during hospitalization, and held at discharge.  As kidney function improved, can resume GDMT as tolerated.   #Mild depression: Patient did note  that he had decreased interest in activities and decreased appetite during hospitalization.  Did consider other etiologies of poor appetite, but given CT abdomen pelvis was negative for any overt cancers, or any other systemic infections, did think this was more related to his mood. He did state that he has felt down since he has not been able to get up on his own.  Did start mirtazapine.  Appetite did improve.  #COPD No acute concerns for exacerbations during hospitalization. Continued home albuterol on discharged.     #Colon cancer status post colostomy  Wound care following during hospitalization for colostomy.  There was some concern, as there was some dried stool around colostomy site concerning for necrotic tissue, but after thorough cleaning, patient had healthy pink tissue noted to colostomy site.    #Hypokalemia Repleted during hospitalization    Discharge Subjective:  Patient resting bed upon my exam.  He states the mirtazapine helped him sleep.  He also reports his appetite was better this morning.  He has been drinking his Ensure, he also reports finishing his breakfast this morning.  He states the pain is the same, but he had gotten up on the side of bed yesterday.  He states he is ready to go to his facility.  Discharge Exam:   BP 118/69   Pulse (!) 50   Temp 98.1 F (36.7 C) (Oral)   Resp 16   Ht 5\' 11"  (1.803 m)   Wt (!) 149.7 kg   SpO2 96%   BMI 46.03 kg/m  Constitutional: well-appearing, resting in bed in no acute distress HENT: normocephalic atraumatic Cardiovascular: regular rate and rhythm, no m/r/g Pulmonary/Chest: normal work of breathing on room air, lungs clear to auscultation bilaterally Abdominal: Colostomy bag with liquid output very dark in nature Extremities: Bilateral lower extremities with Ace bandages wrapped.  Toes with good movement.  Increasing strength noted on hip flexion.  Sensation intact.  No obvious drainage through bandages.  Pertinent  Labs, Studies, and Procedures:     Latest Ref Rng & Units 05/21/2022    3:16 AM 05/20/2022    4:34 AM 05/19/2022    4:07 AM  CBC  WBC 4.0 - 10.5 K/uL 6.0  5.6  5.1   Hemoglobin 13.0 - 17.0 g/dL 10.8  10.7  10.9   Hematocrit 39.0 - 52.0 % 35.5  35.3  36.2   Platelets 150 - 400 K/uL 246  254  238        Latest Ref Rng & Units 05/24/2022   10:08 AM 05/23/2022    7:54 AM 05/22/2022   12:27 AM  CMP  Glucose 70 - 99 mg/dL 95  105  99   BUN 8 - 23 mg/dL 56  57  53   Creatinine 0.61 - 1.24 mg/dL 2.74  3.33  3.50   Sodium  135 - 145 mmol/L 137  133  134   Potassium 3.5 - 5.1 mmol/L 4.0  3.9  4.4   Chloride 98 - 111 mmol/L 109  105  104   CO2 22 - 32 mmol/L 16  17  15    Calcium 8.9 - 10.3 mg/dL 9.3  9.0  9.3     CT ABDOMEN PELVIS W CONTRAST  Result Date: 05/15/2022 CLINICAL DATA:  Sepsis EXAM: CT ABDOMEN AND PELVIS WITH CONTRAST TECHNIQUE: Multidetector CT imaging of the abdomen and pelvis was performed using the standard protocol following bolus administration of intravenous contrast. RADIATION DOSE REDUCTION: This exam was performed according to the departmental dose-optimization program which includes automated exposure control, adjustment of the mA and/or kV according to patient size and/or use of iterative reconstruction technique. CONTRAST:  32mL OMNIPAQUE IOHEXOL 350 MG/ML SOLN IV. No oral contrast. COMPARISON:  None Available. FINDINGS: Lower chest: Minimal pericardial effusion. Pleural thickening at lung bases with LEFT basilar pleural calcifications. Hepatobiliary: Hepatic cysts. Contracted gallbladder with calcified gallstones. Single calcified gallstone in cystic duct. Liver otherwise unremarkable. Pancreas: Normal appearance Spleen: Normal appearance Adrenals/Urinary Tract: Adrenal glands normal appearance. BILATERAL renal cysts up to 5.6 cm RIGHT upper pole and 3.8 cm mid LEFT kidney, none appearing complicated; no follow-up imaging recommended. Tiny nonobstructing RIGHT renal calculi. Mild  BILATERAL collecting system dilatation though this could be in part related to bladder distention. Large RIGHT-sided bladder diverticulum containing multiple calculi. Additional dependent calculi within main bladder lumen. No bladder wall thickening. Stomach/Bowel: Increased stool in rectum. Diverticulosis of descending and sigmoid colon without evidence of diverticulitis. Normal appendix. Ileostomy RIGHT mid abdomen. Anastomotic staple line mid transverse colon. Stomach and remaining bowel loops unremarkable. Vascular/Lymphatic: Atherosclerotic calcifications aorta, iliac arteries, coronary arteries. Aorta normal caliber. No adenopathy. Reproductive: Unremarkable prostate gland and seminal vesicles Other: No free air or free fluid. Redundant small bowel in the ostomy tract without obstruction. Musculoskeletal: Osseous demineralization. Bulging discs at multiple levels. IMPRESSION: Distal colonic diverticulosis without evidence of diverticulitis. Increased stool in rectum. Large RIGHT-sided bladder diverticulum containing multiple calculi. Mild BILATERAL collecting system dilatation though this could be in part related to bladder distention. Tiny nonobstructing RIGHT renal calculi. Cholelithiasis with calcified stone in cystic duct. Aortic Atherosclerosis (ICD10-I70.0). Electronically Signed   By: Lavonia Dana M.D.   On: 05/15/2022 12:33   DG Tibia/Fibula Left Port  Result Date: 05/14/2022 CLINICAL DATA:  Lower extremity pain and swelling, initial encounter EXAM: PORTABLE LEFT TIBIA AND FIBULA - 2 VIEW COMPARISON:  None Available. FINDINGS: No acute fracture or dislocation is noted. Vascular calcifications are seen. Considerable subcutaneous edema is noted with some skin thickening similar to that seen on the right. IMPRESSION: Subcutaneous edema and skin thickening similar to that seen on the right. No acute bony abnormality is noted. Electronically Signed   By: Inez Catalina M.D.   On: 05/14/2022 23:09   DG  Tibia/Fibula Right Port  Result Date: 05/14/2022 CLINICAL DATA:  Right lower extremity pain and swelling, initial encounter EXAM: PORTABLE RIGHT TIBIA AND FIBULA - 2 VIEW COMPARISON:  None Available. FINDINGS: No acute fracture or dislocation is noted. Considerable subcutaneous edema is noted. Some skin thickening is noted as well. IMPRESSION: Subcutaneous edema and skin thickening.  No bony abnormality noted. Electronically Signed   By: Inez Catalina M.D.   On: 05/14/2022 23:06   DG Foot Complete Left  Result Date: 05/14/2022 CLINICAL DATA:  Lower extremity swelling EXAM: LEFT FOOT - COMPLETE 3+ VIEW COMPARISON:  None Available.  FINDINGS: Generalized soft tissue swelling is noted. Tarsal degenerative changes and calcaneal spurring is seen. No acute fracture or dislocation is noted. IMPRESSION: Generalized soft tissue swelling. No acute bony abnormality is seen. Electronically Signed   By: Inez Catalina M.D.   On: 05/14/2022 23:05   DG Foot Complete Right  Result Date: 05/14/2022 CLINICAL DATA:  Ulcerated swollen right foot. EXAM: RIGHT FOOT COMPLETE - 3+ VIEW COMPARISON:  None Available. FINDINGS: There is severe diffuse soft tissue edema. Lateral view appears to demonstrate shallow ulcerations over the top of the midfoot and deeper ulceration involving 1 of the distal toes possibly the second or third toe. Posteriorly there is a broad-based soft tissue defect over the dorsal heel area. The bones are osteopenic. There is no evidence of fractures. No destructive bone lesion is evident. There is moderate midfoot arthrosis. Tarsometatarsal bridging osteophytes are noted on the lateral view superiorly and enthesopathic changes of the fifth metatarsal base. There is enthesopathic spurring of the plantar calcaneus. There are scattered  vascular calcifications in the distal foreleg. IMPRESSION: 1. Severe diffuse soft tissue edema. 2. Osteopenia and midfoot arthrosis. 3. Deep ulceration involving 1 of the distal  toes, possibly the second or third toe but only well seen on the lateral view. Additional dorsal heel ulceration. 4. No destructive bone lesion is seen. 5. Vascular calcifications. Electronically Signed   By: Telford Nab M.D.   On: 05/14/2022 21:34     Discharge Instructions:  Mr. Kruse Shoupe,  It was a pleasure taking care of you at [facility] were admitted for an infection, and treated with antibiotics.  We also work with you to increase your strength.  We are discharging you to a facility to ensure you get stronger. Please follow the following instructions.   1) Regarding your strength, we are sending you to a facility.  Please work with PT/OT to continue to get stronger.  I did send you on some oxycodone for 3 days, please use this sparingly.  I also sent you home on some gabapentin to help with the pain.  You can also take Tylenol.  2) Regarding your lower extremity dermatitis, please follow-up with your podiatrist.  Your wife states that you used to follow [facility] [address] [place].  Please make an appointment with them within the next week.  3) Please follow-up with your primary care physician in the next week or 2 for hospital follow-up.  4) We have made certain medication changes including we stopped your Lasix, losartan, Jardiance, and diltiazem.  Please talk to your primary care doctor to introducing these medications back into your daily regimen.  Continue taking your metoprolol.  5) We have started you on mirtazapine, to help with your appetite.  Please take this nightly.  6) Regarding your infection, this should have cleared.  If you do develop fevers and chills and your fever does not break, please return back to the emergency room.  Take care,  Dr. Leigh Aurora, DO   Signed: Leigh Aurora, DO 05/24/2022, 11:54 AM   Pager: [phone]

## 2022-05-24 NOTE — Discharge Instructions (Signed)
[name],  It was a pleasure taking care of you at [facility] were admitted for an infection, and treated with antibiotics.  We also work with you to increase your strength.  We are discharging you to a facility to ensure you get stronger. Please follow the following instructions.   1) Regarding your strength, we are sending you to a facility.  Please work with PT/OT to continue to get stronger.  I did send you on some oxycodone for 3 days, please use this sparingly.  I also sent you home on some gabapentin to help with the pain.  You can also take Tylenol.  2) Regarding your lower extremity dermatitis, please follow-up with your podiatrist.  Your wife states that you used to follow [facility] [place].  Please make an appointment with them within the next week.  3) Please follow-up with your primary care physician in the next week or 2 for hospital follow-up.  4) We have made certain medication changes including we stopped your Lasix, losartan, Jardiance, and diltiazem.  Please talk to your primary care doctor to introducing these medications back into your daily regimen.  Continue taking your metoprolol.  5) We have started you on mirtazapine, to help with your appetite.  Please take this nightly.  6) Regarding your infection, this should have cleared.  If you do develop fevers and chills and your fever does not break, please return back to the emergency room.  Take care,  Dr. Leigh Aurora, DO

## 2022-05-24 NOTE — Progress Notes (Signed)
EVS packet given to EMS workers and pt transferred to EMS stretcher with [facility].  Pt Dc'd via stretcher and ambulance to go to [facility].

## 2022-05-24 NOTE — Progress Notes (Signed)
Report called to [facility], LPN at [facility].

## 2022-05-24 NOTE — Progress Notes (Signed)
Occupational Therapy Treatment Patient Details Name: Larry [name] MRN: [id number] DOB: 1950-06-11 Today's Date: 05/24/2022   History of present illness Patient is a 72 yo male presenting to the ED with BLE swelling, increased weakness and pain with an inability to walk on 05/14/22. Admitted same day with cellulitis; R dorsal foot wound, bil ankle indentations at sock cuffs;  PMH includes: HTN, HLD, CAD, COPD, obesity, NSVT, CKD, CHF, colon cancer s/p resection and colostomy placement, and sleep apnea   OT comments  Pt was seen for OT ADL retraining session with focus on bed mobility and attempted sitting up at EOB. Pt Is Max A advancing LLE to side of bed in preparation for sitting up at EOB. Pt was able to move RLE given increased time and verbal encouragement. Pt then used bed rails and +1 HHA to come into long sitting in preparation for increased participation with ADL's and for sitting balance activities at edge of bed. Pt was then able to place RLE on floor and with HOB elevated, he used +1 HHA and rail to elevate his trunk about 70% off bed x3. Pt reports pain in right groin area when attempting to sit ranging from 2-6/10. Pt was assisted back to bed with +2 total assist. Pt was educated in performing active ROM to bilateral UE's in preparation for ROM and strengthening. Pt verbalized understanding of this and was hopeful to transfer to SNF rehab when medically able.   Recommendations for follow up therapy are one component of a multi-disciplinary discharge planning process, led by the attending physician.  Recommendations may be updated based on patient status, additional functional criteria and insurance authorization.    Assistance Recommended at Discharge Frequent or constant Supervision/Assistance  Patient can return home with the following  Two people to help with walking and/or transfers;A lot of help with bathing/dressing/bathroom;Assist for transportation;Help with stairs or ramp  for entrance   Equipment Recommendations  Other (comment) (Defer to next venue)    Recommendations for Other Services      Precautions / Restrictions Precautions Precautions: Fall Precaution Comments: R groin pain with hip flexion Restrictions Weight Bearing Restrictions: No       Mobility Bed Mobility Overal bed mobility: Needs Assistance Bed Mobility: Supine to Sit, Sit to Supine, Rolling Rolling: Min assist   Supine to sit: Max assist, +2 for physical assistance, HOB elevated Sit to supine: Total assist, Max assist, +2 for physical assistance   General bed mobility comments: max A to come to partial sitting on EOB, pt able to initiate BLEs to EOB with cueing for sequencing and reach across body to pull on bedrail, max A to elevate trunk to sitting with pt unable to tolerate full upright sitting due to right sided groin/abdominal pain with increased hip flexion in sitting, pt was returned to supine with +2 total A to elevate BLEs    Transfers   General transfer comment: Unable to tolerate progressing mobility due to R groin pain     Balance Overall balance assessment: Needs assistance Sitting-balance support: Bilateral upper extremity supported, Feet supported Sitting balance-Leahy Scale: Zero Sitting balance - Comments: Max assist and support at back to maintain sitting; unable to tolerate full upright sitting this date due to groin pain Postural control: Posterior lean (Heavy posterior lean)       ADL either performed or assessed with clinical judgement   ADL Overall ADL's : Needs assistance/impaired Eating/Feeding: Set up;Bed level   Grooming: Set up;Bed level   General ADL  Comments: Pt was seen for OT ADL retraining session with focus on bed mobility and attempted sitting up at EOB. Pt Is Max A advancing LLE to side of bed in preparation for sitting up at EOB. Pt was able to move RLE given increased time and verbal encouragement. Pt then used bed rails and +1 HHA  to come into long sitting in preparation for increased participation with ADL's and for sitting balance activities at edge of bed. Pt was then able to place RLE on floor and with HOB elevated, he used +1 HHA and rail to elevate his trunk about 70% off bed x3. Pt reports pain in right groin area when attempting to sit ranging from 2-6/10. Pt was assisted back to bed with +2 total assist. Pt was educated in performing active ROM to bilateral UE's in preparation for ROM and strengthening. Pt verbalized understanding of this and was hopeful to transfer to SNF rehab when medically able.    Extremity/Trunk Assessment Upper Extremity Assessment Upper Extremity Assessment: Generalized weakness;Defer to OT evaluation   Lower Extremity Assessment Lower Extremity Assessment: Defer to PT evaluation        Vision Ability to See in Adequate Light: 0 Adequate Patient Visual Report: No change from baseline Vision Assessment?: No apparent visual deficits          Cognition Arousal/Alertness: Awake/alert Behavior During Therapy: WFL for tasks assessed/performed Overall Cognitive Status: Within Functional Limits for tasks assessed       Exercises General Exercises - Upper Extremity Shoulder Flexion: AROM, Both, 5 reps, Supine Shoulder ABduction: AROM, Both, 5 reps, Supine Shoulder ADduction: AROM, 5 reps, Both, Supine Elbow Flexion: AROM, Both, 5 reps, Supine Elbow Extension: AROM, Both, 5 reps, Supine Wrist Flexion: AROM, Both, 5 reps, Supine Wrist Extension: AROM, Both, 5 reps, Supine Digit Composite Flexion: AROM, Both, 5 reps, Supine Composite Extension: AROM, Both, 5 reps, Supine    Shoulder Instructions       General Comments VSS on RA, RN notified that assist needed for repositioning and scooting up in bed. Pt was repositioned in bed with call bell, phone and meal tray in reach.    Pertinent Vitals/ Pain       Pain Assessment Pain Assessment: Faces Faces Pain Scale: Hurts even  more Pain Location: R groin with hip flexion Pain Descriptors / Indicators: Discomfort, Grimacing, Guarding Pain Intervention(s): Limited activity within patient's tolerance, Monitored during session, Repositioned  Home Living Refer to initial Eval     Prior Functioning/Environment   Please refer to initial Eval for details of PLOF     Frequency  Min 2X/week        Progress Toward Goals  OT Goals(current goals can now be found in the care plan section)  Progress towards OT goals: Not progressing toward goals - comment  Acute Rehab OT Goals Patient Stated Goal: Go to SNF rehab when able OT Goal Formulation: With patient Time For Goal Achievement: 05/29/22 Potential to Achieve Goals: [place] Discharge plan remains appropriate       AM-PAC OT "6 Clicks" Daily Activity     Outcome Measure   Help from another person eating meals?: None Help from another person taking care of personal grooming?: A Little Help from another person toileting, which includes using toliet, bedpan, or urinal?: Total Help from another person bathing (including washing, rinsing, drying)?: A Lot Help from another person to put on and taking off regular upper body clothing?: A Lot Help from another person to put  on and taking off regular lower body clothing?: Total 6 Click Score: 13    End of Session  In Bed all needs met. Call bell, phone and tray in reach  OT Visit Diagnosis: Unsteadiness on feet (R26.81);Other abnormalities of gait and mobility (R26.89);Muscle weakness (generalized) (M62.81);Pain Pain - Right/Left: Right Pain - part of body:  (Abdomen/groin)   Activity Tolerance Patient limited by pain   Patient Left in bed;with call bell/phone within reach;with bed alarm set   Nurse Communication Mobility status;Other (comment) (Need for assist in repositioning in bed)        Time: [id number] OT Time Calculation (min): 31 min  Charges: OT General Charges $OT Visit: 1 Visit OT  Treatments $Self Care/Home Management : 8-22 mins $Therapeutic Activity: 8-22 mins   Kaito Schulenburg Beth Dixon, OTR/L 05/24/2022, 12:07 PM

## 2022-06-22 DEATH — deceased

## 2022-08-12 ENCOUNTER — Ambulatory Visit: Payer: Federal, State, Local not specified - PPO | Admitting: Cardiology
